# Patient Record
Sex: Female | Born: 1940
Health system: Southern US, Community
[De-identification: ages and names within clinical notes are randomized; demographics above are authoritative.]

## PROBLEM LIST (undated history)

## (undated) ENCOUNTER — Emergency Department (HOSPITAL_COMMUNITY): Admission: EM | Discharge status: Absent without leave | Payer: Medicare Other

## (undated) DIAGNOSIS — K449 Diaphragmatic hernia without obstruction or gangrene: Secondary | ICD-10-CM

## (undated) DIAGNOSIS — I499 Cardiac arrhythmia, unspecified: Secondary | ICD-10-CM

## (undated) DIAGNOSIS — R52 Pain, unspecified: Secondary | ICD-10-CM

## (undated) DIAGNOSIS — R053 Chronic cough: Secondary | ICD-10-CM

## (undated) DIAGNOSIS — F17210 Nicotine dependence, cigarettes, uncomplicated: Secondary | ICD-10-CM

## (undated) DIAGNOSIS — C159 Malignant neoplasm of esophagus, unspecified: Secondary | ICD-10-CM

## (undated) DIAGNOSIS — H269 Unspecified cataract: Secondary | ICD-10-CM

## (undated) DIAGNOSIS — Z8701 Personal history of pneumonia (recurrent): Secondary | ICD-10-CM

## (undated) DIAGNOSIS — C801 Malignant (primary) neoplasm, unspecified: Secondary | ICD-10-CM

## (undated) DIAGNOSIS — K635 Polyp of colon: Secondary | ICD-10-CM

## (undated) DIAGNOSIS — J189 Pneumonia, unspecified organism: Secondary | ICD-10-CM

## (undated) DIAGNOSIS — C3490 Malignant neoplasm of unspecified part of unspecified bronchus or lung: Secondary | ICD-10-CM

## (undated) DIAGNOSIS — K5790 Diverticulosis of intestine, part unspecified, without perforation or abscess without bleeding: Secondary | ICD-10-CM

## (undated) DIAGNOSIS — E559 Vitamin D deficiency, unspecified: Secondary | ICD-10-CM

## (undated) DIAGNOSIS — N809 Endometriosis, unspecified: Secondary | ICD-10-CM

## (undated) DIAGNOSIS — D492 Neoplasm of unspecified behavior of bone, soft tissue, and skin: Principal | ICD-10-CM

## (undated) DIAGNOSIS — K2270 Barrett's esophagus without dysplasia: Secondary | ICD-10-CM

## (undated) DIAGNOSIS — F329 Major depressive disorder, single episode, unspecified: Secondary | ICD-10-CM

## (undated) DIAGNOSIS — K219 Gastro-esophageal reflux disease without esophagitis: Secondary | ICD-10-CM

## (undated) DIAGNOSIS — E785 Hyperlipidemia, unspecified: Secondary | ICD-10-CM

## (undated) DIAGNOSIS — I10 Essential (primary) hypertension: Secondary | ICD-10-CM

## (undated) DIAGNOSIS — K2970 Gastritis, unspecified, without bleeding: Secondary | ICD-10-CM

## (undated) DIAGNOSIS — K589 Irritable bowel syndrome without diarrhea: Secondary | ICD-10-CM

## (undated) DIAGNOSIS — K529 Noninfective gastroenteritis and colitis, unspecified: Secondary | ICD-10-CM

## (undated) DIAGNOSIS — R05 Cough: Secondary | ICD-10-CM

## (undated) DIAGNOSIS — J439 Emphysema, unspecified: Secondary | ICD-10-CM

## (undated) DIAGNOSIS — R739 Hyperglycemia, unspecified: Secondary | ICD-10-CM

## (undated) HISTORY — PX: ABDOMINAL HYSTERECTOMY: SHX81

## (undated) HISTORY — DX: Endometriosis, unspecified: N80.9

## (undated) HISTORY — DX: Personal history of pneumonia (recurrent): Z87.01

## (undated) HISTORY — DX: Noninfective gastroenteritis and colitis, unspecified: K52.9

## (undated) HISTORY — DX: Nicotine dependence, cigarettes, uncomplicated: F17.210

## (undated) HISTORY — DX: Emphysema, unspecified: J43.9

## (undated) HISTORY — DX: Vitamin D deficiency, unspecified: E55.9

## (undated) HISTORY — DX: Polyp of colon: K63.5

## (undated) HISTORY — DX: Barrett's esophagus without dysplasia: K22.70

## (undated) HISTORY — DX: Malignant neoplasm of esophagus, unspecified: C15.9

## (undated) HISTORY — PX: GANGLION CYST EXCISION: SHX1691

## (undated) HISTORY — PX: TONSILLECTOMY: SUR1361

## (undated) HISTORY — PX: KNEE ARTHROSCOPY: SHX127

## (undated) HISTORY — DX: Gastro-esophageal reflux disease without esophagitis: K21.9

## (undated) HISTORY — PX: COLONOSCOPY W/ POLYPECTOMY: SHX1380

## (undated) HISTORY — DX: Diaphragmatic hernia without obstruction or gangrene: K44.9

## (undated) HISTORY — DX: Hyperlipidemia, unspecified: E78.5

## (undated) HISTORY — PX: TOTAL ABDOMINAL HYSTERECTOMY W/ BILATERAL SALPINGOOPHORECTOMY: SHX83

## (undated) HISTORY — DX: Diverticulosis of intestine, part unspecified, without perforation or abscess without bleeding: K57.90

## (undated) HISTORY — DX: Gastritis, unspecified, without bleeding: K29.70

## (undated) HISTORY — DX: Major depressive disorder, single episode, unspecified: F32.9

## (undated) HISTORY — DX: Unspecified cataract: H26.9

## (undated) HISTORY — DX: Malignant (primary) neoplasm, unspecified: C80.1

## (undated) HISTORY — PX: BARTHOLIN GLAND CYST EXCISION: SHX565

## (undated) HISTORY — DX: Neoplasm of unspecified behavior of bone, soft tissue, and skin: D49.2

## (undated) HISTORY — PX: CATARACT EXTRACTION: SUR2

---

## 1999-01-12 ENCOUNTER — Other Ambulatory Visit: Admission: RE | Admit: 1999-01-12 | Discharge: 1999-01-12 | Payer: Self-pay | Admitting: Family Medicine

## 2000-03-01 ENCOUNTER — Ambulatory Visit (HOSPITAL_COMMUNITY): Admission: RE | Admit: 2000-03-01 | Discharge: 2000-03-01 | Payer: Self-pay | Admitting: Gastroenterology

## 2000-03-01 ENCOUNTER — Encounter (INDEPENDENT_AMBULATORY_CARE_PROVIDER_SITE_OTHER): Payer: Self-pay | Admitting: Specialist

## 2000-07-14 ENCOUNTER — Ambulatory Visit (HOSPITAL_COMMUNITY): Admission: RE | Admit: 2000-07-14 | Discharge: 2000-07-14 | Payer: Self-pay | Admitting: Family Medicine

## 2000-07-14 ENCOUNTER — Encounter: Payer: Self-pay | Admitting: Family Medicine

## 2000-07-19 ENCOUNTER — Encounter: Payer: Self-pay | Admitting: Family Medicine

## 2000-07-19 ENCOUNTER — Encounter: Admission: RE | Admit: 2000-07-19 | Discharge: 2000-07-19 | Payer: Self-pay | Admitting: Family Medicine

## 2002-03-13 ENCOUNTER — Encounter: Payer: Self-pay | Admitting: Family Medicine

## 2002-03-13 ENCOUNTER — Encounter: Admission: RE | Admit: 2002-03-13 | Discharge: 2002-03-13 | Payer: Self-pay | Admitting: Family Medicine

## 2003-03-17 ENCOUNTER — Encounter: Admission: RE | Admit: 2003-03-17 | Discharge: 2003-03-17 | Payer: Self-pay | Admitting: Family Medicine

## 2003-03-17 ENCOUNTER — Encounter: Payer: Self-pay | Admitting: Family Medicine

## 2003-09-10 ENCOUNTER — Encounter: Admission: RE | Admit: 2003-09-10 | Discharge: 2003-09-10 | Payer: Self-pay | Admitting: Family Medicine

## 2003-09-10 ENCOUNTER — Encounter: Payer: Self-pay | Admitting: Family Medicine

## 2004-10-12 ENCOUNTER — Ambulatory Visit: Payer: Self-pay

## 2004-10-26 ENCOUNTER — Encounter: Admission: RE | Admit: 2004-10-26 | Discharge: 2004-10-26 | Payer: Self-pay | Admitting: Internal Medicine

## 2004-11-16 ENCOUNTER — Ambulatory Visit: Payer: Self-pay | Admitting: Cardiology

## 2006-09-11 ENCOUNTER — Encounter: Admission: RE | Admit: 2006-09-11 | Discharge: 2006-09-11 | Payer: Self-pay

## 2006-09-20 ENCOUNTER — Encounter: Admission: RE | Admit: 2006-09-20 | Discharge: 2006-09-20 | Payer: Self-pay

## 2006-10-06 ENCOUNTER — Encounter: Admission: RE | Admit: 2006-10-06 | Discharge: 2006-10-06 | Payer: Self-pay | Admitting: Family Medicine

## 2007-12-13 ENCOUNTER — Ambulatory Visit (HOSPITAL_COMMUNITY): Admission: RE | Admit: 2007-12-13 | Discharge: 2007-12-13 | Payer: Self-pay

## 2009-01-23 ENCOUNTER — Encounter: Admission: RE | Admit: 2009-01-23 | Discharge: 2009-01-23 | Payer: Self-pay | Admitting: Family Medicine

## 2009-01-29 ENCOUNTER — Encounter: Admission: RE | Admit: 2009-01-29 | Discharge: 2009-01-29 | Payer: Self-pay | Admitting: Family Medicine

## 2009-08-27 ENCOUNTER — Encounter: Admission: RE | Admit: 2009-08-27 | Discharge: 2009-08-27 | Payer: Self-pay | Admitting: Family Medicine

## 2014-10-03 ENCOUNTER — Other Ambulatory Visit: Payer: Self-pay | Admitting: Family Medicine

## 2014-10-03 ENCOUNTER — Ambulatory Visit
Admission: RE | Admit: 2014-10-03 | Discharge: 2014-10-03 | Disposition: A | Discharge status: Home / Self Care | Payer: Medicare Other | Source: Ambulatory Visit | Attending: Family Medicine | Admitting: Family Medicine

## 2014-10-03 DIAGNOSIS — R059 Cough, unspecified: Secondary | ICD-10-CM

## 2014-10-03 DIAGNOSIS — R05 Cough: Secondary | ICD-10-CM

## 2015-05-21 ENCOUNTER — Other Ambulatory Visit: Payer: Self-pay | Admitting: Family Medicine

## 2015-05-21 ENCOUNTER — Ambulatory Visit
Admission: RE | Admit: 2015-05-21 | Discharge: 2015-05-21 | Disposition: A | Discharge status: Home / Self Care | Payer: Medicare Other | Source: Ambulatory Visit | Attending: Family Medicine | Admitting: Family Medicine

## 2015-05-21 DIAGNOSIS — M25552 Pain in left hip: Secondary | ICD-10-CM

## 2015-06-11 ENCOUNTER — Encounter (HOSPITAL_COMMUNITY): Payer: Self-pay | Admitting: *Deleted

## 2015-06-11 ENCOUNTER — Emergency Department (HOSPITAL_COMMUNITY)
Admission: EM | Admit: 2015-06-11 | Discharge: 2015-06-12 | Disposition: A | Discharge status: Home / Self Care | Payer: Medicare Other | Attending: Emergency Medicine | Admitting: Emergency Medicine

## 2015-06-11 DIAGNOSIS — M25552 Pain in left hip: Secondary | ICD-10-CM | POA: Diagnosis present

## 2015-06-11 DIAGNOSIS — R5383 Other fatigue: Secondary | ICD-10-CM | POA: Insufficient documentation

## 2015-06-11 DIAGNOSIS — Z8719 Personal history of other diseases of the digestive system: Secondary | ICD-10-CM | POA: Insufficient documentation

## 2015-06-11 DIAGNOSIS — R1900 Intra-abdominal and pelvic swelling, mass and lump, unspecified site: Secondary | ICD-10-CM | POA: Insufficient documentation

## 2015-06-11 DIAGNOSIS — Z720 Tobacco use: Secondary | ICD-10-CM | POA: Diagnosis not present

## 2015-06-11 DIAGNOSIS — I10 Essential (primary) hypertension: Secondary | ICD-10-CM | POA: Insufficient documentation

## 2015-06-11 HISTORY — DX: Essential (primary) hypertension: I10

## 2015-06-11 HISTORY — DX: Irritable bowel syndrome, unspecified: K58.9

## 2015-06-11 LAB — BASIC METABOLIC PANEL
Anion gap: 7 (ref 5–15)
BUN: 19 mg/dL (ref 6–20)
CALCIUM: 9.6 mg/dL (ref 8.9–10.3)
CO2: 29 mmol/L (ref 22–32)
Chloride: 102 mmol/L (ref 101–111)
Creatinine, Ser: 0.87 mg/dL (ref 0.44–1.00)
GLUCOSE: 103 mg/dL — AB (ref 65–99)
Potassium: 3.4 mmol/L — ABNORMAL LOW (ref 3.5–5.1)
SODIUM: 138 mmol/L (ref 135–145)

## 2015-06-11 LAB — CBC WITH DIFFERENTIAL/PLATELET
BASOS ABS: 0.1 10*3/uL (ref 0.0–0.1)
Basophils Relative: 1 % (ref 0–1)
EOS ABS: 0.2 10*3/uL (ref 0.0–0.7)
EOS PCT: 2 % (ref 0–5)
HEMATOCRIT: 40.5 % (ref 36.0–46.0)
Hemoglobin: 13.4 g/dL (ref 12.0–15.0)
Lymphocytes Relative: 21 % (ref 12–46)
Lymphs Abs: 1.8 10*3/uL (ref 0.7–4.0)
MCH: 28.8 pg (ref 26.0–34.0)
MCHC: 33.1 g/dL (ref 30.0–36.0)
MCV: 87.1 fL (ref 78.0–100.0)
Monocytes Absolute: 0.6 10*3/uL (ref 0.1–1.0)
Monocytes Relative: 6 % (ref 3–12)
Neutro Abs: 6.2 10*3/uL (ref 1.7–7.7)
Neutrophils Relative %: 70 % (ref 43–77)
PLATELETS: 204 10*3/uL (ref 150–400)
RBC: 4.65 MIL/uL (ref 3.87–5.11)
RDW: 13.2 % (ref 11.5–15.5)
WBC: 8.9 10*3/uL (ref 4.0–10.5)

## 2015-06-11 MED ORDER — OXYCODONE-ACETAMINOPHEN 5-325 MG PO TABS
ORAL_TABLET | ORAL | Status: AC
  Filled 2015-06-11: qty 1

## 2015-06-11 MED ORDER — OXYCODONE-ACETAMINOPHEN 5-325 MG PO TABS
1.0000 | ORAL_TABLET | Freq: Once | ORAL | Status: AC
  Administered 2015-06-11: 1 via ORAL

## 2015-06-11 MED ORDER — IOHEXOL 300 MG/ML  SOLN
25.0000 mL | Freq: Once | INTRAMUSCULAR | Status: AC | PRN
  Administered 2015-06-11: 25 mL via ORAL

## 2015-06-11 NOTE — ED Notes (Signed)
Will inform CT when pt's blood work has resulted.

## 2015-06-11 NOTE — ED Notes (Signed)
Pt in c/o left hip pain that has been going on for a few weeks and has been treated by PCP, sent over today due to worsening symptoms and increased pain for CT scans of hip and pelvis, denies n/v or fever, no distress noted

## 2015-06-11 NOTE — ED Provider Notes (Signed)
CSN: [id number]     Arrival date & time 06/11/15  1722 History   First MD Initiated Contact with Patient 06/11/15 2158     Chief Complaint  Patient presents with  . Hip Pain     (Consider location/radiation/quality/duration/timing/severity/associated sxs/prior Treatment) Patient is a 74 y.o. female presenting with hip pain. The history is provided by the patient. No language interpreter was used.  Hip Pain This is a chronic problem. The current episode started more than 1 month ago. The problem occurs constantly. The problem has been gradually worsening. Associated symptoms include arthralgias (left hip pain) and fatigue. Pertinent negatives include no abdominal pain, anorexia, change in bowel habit, chest pain, chills, congestion, coughing, diaphoresis, fever, headaches, myalgias, nausea, vomiting or weakness. Treatments tried: steroid injections, OTC pain meds. The treatment provided no relief.    Past Medical History  Diagnosis Date  . IBS (irritable bowel syndrome)   . Hypertension    History reviewed. No pertinent past surgical history. History reviewed. No pertinent family history. History  Substance Use Topics  . Smoking status: Current Every Day Smoker  . Smokeless tobacco: Not on file  . Alcohol Use: Not on file   OB History    No data available     Review of Systems  Constitutional: Positive for fatigue. Negative for fever, chills and diaphoresis.  HENT: Negative for congestion.   Respiratory: Negative for cough, chest tightness and shortness of breath.   Cardiovascular: Negative for chest pain.  Gastrointestinal: Negative for nausea, vomiting, abdominal pain, anorexia and change in bowel habit.  Musculoskeletal: Positive for arthralgias (left hip pain). Negative for myalgias.  Neurological: Negative for weakness, light-headedness and headaches.  Psychiatric/Behavioral: Negative for confusion.  All other systems reviewed and are negative.     Allergies  Review  of patient's allergies indicates no known allergies.  Home Medications   Prior to Admission medications   Not on File   BP 106/70 mmHg  Pulse 49  Temp(Src) 97.9 F (36.6 C) (Oral)  Resp 18  SpO2 93% Physical Exam  Constitutional: She is oriented to person, place, and time. She appears well-developed and well-nourished. No distress.  HENT:  Head: Normocephalic and atraumatic.  Nose: Nose normal.  Mouth/Throat: Oropharynx is clear and moist. No oropharyngeal exudate.  Eyes: EOM are normal. Pupils are equal, round, and reactive to light.  Neck: Normal range of motion. Neck supple.  Cardiovascular: Normal rate, regular rhythm, normal heart sounds and intact distal pulses.   No murmur heard. Pulmonary/Chest: Effort normal and breath sounds normal. No respiratory distress. She has no wheezes. She exhibits no tenderness.  Abdominal: Soft. There is no tenderness. There is no rebound and no guarding.  nontender throughout.  No masses palpable.  No inguinal lymphadenopathy palpable  Musculoskeletal: Normal range of motion. She exhibits no tenderness.  No reproducible tenderness at left hip posteriorly, laterally, or left anterior hip/groin.  Full ROM of bilateral lower extremities.  Good distal pulses.  No edema.    Lymphadenopathy:    She has no cervical adenopathy.  Neurological: She is alert and oriented to person, place, and time. No cranial nerve deficit. Coordination normal.  Skin: Skin is warm and dry. She is not diaphoretic.  Psychiatric: She has a normal mood and affect. Her behavior is normal. Judgment and thought content normal.  Nursing note and vitals reviewed.   ED Course  Procedures (including critical care time) Labs Review Labs Reviewed  CBC WITH DIFFERENTIAL/PLATELET  BASIC METABOLIC PANEL  Imaging Review Ct Abdomen Pelvis W Contrast  06/12/2015   CLINICAL DATA:  Severe left groin and hip pain for 6 months.  EXAM: CT ABDOMEN AND PELVIS WITH CONTRAST  TECHNIQUE:  Multidetector CT imaging of the abdomen and pelvis was performed using the standard protocol following bolus administration of intravenous contrast.  CONTRAST:  21m OMNIPAQUE IOHEXOL 300 MG/ML  SOLN  COMPARISON:  None.  FINDINGS: BODY WALL: No contributory findings.  LOWER CHEST: No contributory findings.  ABDOMEN/PELVIS:  Liver: Multiple low-dense hepatic lesions are most consistent with cysts. No evidence of solid mass.  Biliary: No evidence of biliary obstruction or stone.  Pancreas: Unremarkable.  Spleen: Unremarkable.  Adrenals: Unremarkable.  Kidneys and ureters: No hydronephrosis or stone.  Bladder: Unremarkable.  Reproductive: Hysterectomy and probable oophorectomies. No adnexal mass.  Bowel: No obstruction. No appendicitis.  Retroperitoneum: No mass or adenopathy.  Peritoneum: No ascites or pneumoperitoneum.  Vascular: No acute abnormality. Multi focal atherosclerosis of the aorta and branch vessels.  OSSEOUS: Destructive lytic lesion encompassing the majority of the left iliac wing, measuring at least 9 cm in diameter and 5.5 cm in thickness. The lesion is centrally cystic and peripherally enhancing. There is invasion into the iliacus and gluteal musculature. No other bone lesion. No discrete fracture.  IMPRESSION: 9 cm aggressive lesion in the left ilium, infiltrating the iliacus and gluteal muscles. This could reflect a plasmacytoma, metastasis, or sarcoma.   Electronically Signed   By: JMonte FantasiaM.D.   On: 06/12/2015 00:30     EKG Interpretation None      MDM   Final diagnoses:  Pelvic mass  Left hip pain   Pt is a 74yo F with hx of IBS, tobacco abuse, and HTN who presented with left groin pain.  Has had achy left hip pain for the past few months. Pain has been vague dull pain that is diffuse at left posterior and lateral left hip. Today while walking she developed left groin pain. More severe than previous pain in her hip.  Denies falls, stepping funny, or hearing popping sounds.    Has been seen by PCP for her pain.  Received 2 rounds of steroid injections without improvement.  Went back today to PCP and he was concerned about possibly palpating a left groin mass, so referred her to the ED.  Pt is s/p hystererctomy and oophorectomy but has a significant family hx of colon cancer (dad and brother).  Due to the mass, pt was sent to the ED to get a CT abdomen.   No pinpoint tenderness to palpation of left lower abdomen/groin or hip.  No masses palpated at this time.  No lymphadenopathy.  Full ROM of left hip and nontender at lateral hip palpation.    Will get labs then send to CT scanner for CT abd/pelvis with contrast to evaluate further due to concerns for possible mass and chronic dull pain for months in this patient with risk factors for cancers.   CT scan returned with a large left mass at her iliac wing, concerning for sarcoma or possible met.  No other known cancers.  + significant tobacco abuse.  + significant family hx of colon cancer; her last colonoscopy was ok in October.   Patient was informed and all questions were answered.  Will need to follow up closely with heme/onc for determining the next step.  Has f/u already scheduled with PCP on Monday (3 days) so they can help ensure appropriate heme/onc follow up is made  if patient has difficulty getting an appointment.  She was given a print out of the radiology read and advised how to call medical records to get a CD image if her other doctors need it in the future.  Given Rx for oxycodone and advised on use and risks of narcotics.  Discussed ED return precautions.      If performed, labs, EKGs, and imaging were reviewed and interpreted by myself and my attending, and incorporated in the medical decision making.  Patient was seen with ED Attending, Dr. Oliva Bustard, MD   Tori Milks, MD 06/13/15 [id number]  Pamella Pert, MD 06/13/15 [phone]

## 2015-06-11 NOTE — ED Notes (Signed)
CT called and informed that pt is ready.

## 2015-06-12 ENCOUNTER — Encounter (HOSPITAL_COMMUNITY): Payer: Self-pay

## 2015-06-12 ENCOUNTER — Emergency Department (HOSPITAL_COMMUNITY): Payer: Medicare Other

## 2015-06-12 MED ORDER — OXYCODONE HCL 5 MG PO TABS: 5.0000 mg | ORAL_TABLET | Freq: Four times a day (QID) | ORAL | Status: DC | PRN

## 2015-06-12 MED ORDER — OXYCODONE HCL 5 MG PO TABS
5.0000 mg | ORAL_TABLET | Freq: Once | ORAL | Status: AC
  Administered 2015-06-12: 5 mg via ORAL
  Filled 2015-06-12: qty 1

## 2015-06-12 MED ORDER — IOHEXOL 300 MG/ML  SOLN
80.0000 mL | Freq: Once | INTRAMUSCULAR | Status: AC | PRN
  Administered 2015-06-12: 80 mL via INTRAVENOUS

## 2015-06-12 NOTE — ED Notes (Signed)
MD at bedside. 

## 2015-06-12 NOTE — Discharge Instructions (Signed)
Please follow up with the hematology/oncology clinic as soon as possible to help identify your pelvic bone mass.     CLINICAL DATA: Severe left groin and hip pain for 6 months.  EXAM: CT ABDOMEN AND PELVIS WITH CONTRAST  TECHNIQUE: Multidetector CT imaging of the abdomen and pelvis was performed using the standard protocol following bolus administration of intravenous contrast.  CONTRAST: 65m OMNIPAQUE IOHEXOL 300 MG/ML SOLN  COMPARISON: None.  FINDINGS: BODY WALL: No contributory findings.  LOWER CHEST: No contributory findings.  ABDOMEN/PELVIS:  Liver: Multiple low-dense hepatic lesions are most consistent with cysts. No evidence of solid mass.  Biliary: No evidence of biliary obstruction or stone.  Pancreas: Unremarkable.  Spleen: Unremarkable.  Adrenals: Unremarkable.  Kidneys and ureters: No hydronephrosis or stone.  Bladder: Unremarkable.  Reproductive: Hysterectomy and probable oophorectomies. No adnexal mass.  Bowel: No obstruction. No appendicitis.  Retroperitoneum: No mass or adenopathy.  Peritoneum: No ascites or pneumoperitoneum.  Vascular: No acute abnormality. Multi focal atherosclerosis of the aorta and branch vessels.  OSSEOUS: Destructive lytic lesion encompassing the majority of the left iliac wing, measuring at least 9 cm in diameter and 5.5 cm in thickness. The lesion is centrally cystic and peripherally enhancing. There is invasion into the iliacus and gluteal musculature. No other bone lesion. No discrete fracture.  IMPRESSION: 9 cm aggressive lesion in the left ilium, infiltrating the iliacus and gluteal muscles. This could reflect a plasmacytoma, metastasis, or sarcoma.

## 2015-06-16 ENCOUNTER — Encounter: Payer: Self-pay | Admitting: Hematology

## 2015-06-16 ENCOUNTER — Ambulatory Visit: Payer: Medicare Other

## 2015-06-16 ENCOUNTER — Encounter: Payer: Medicare Other | Admitting: Hematology

## 2015-06-16 ENCOUNTER — Telehealth: Payer: Self-pay | Admitting: Hematology

## 2015-06-16 NOTE — Telephone Encounter (Signed)
new patient appt-s/w patient and gave np appt for 07/12 @ 12:30 w/Dr.Kale.  Referring Dr. Tamsen Roers Dx- Sarcoma; Plasmacytoma

## 2015-06-16 NOTE — Progress Notes (Signed)
Pt request to reschedule in order to be seen by Dr Julien Nordmann.   This encounter was created in error - please disregard.

## 2015-06-18 ENCOUNTER — Telehealth: Payer: Self-pay | Admitting: Internal Medicine

## 2015-06-18 NOTE — Telephone Encounter (Signed)
New patient appt-s/w patient and gave np appt for 07/25 @ 11:15 w/Dr. Julien Nordmann.  Dx- Sarcoma; plasmacytoma

## 2015-06-24 ENCOUNTER — Encounter: Payer: Self-pay | Admitting: Hematology

## 2015-06-24 ENCOUNTER — Other Ambulatory Visit: Payer: Self-pay | Admitting: *Deleted

## 2015-06-24 ENCOUNTER — Telehealth: Payer: Self-pay | Admitting: Hematology

## 2015-06-24 ENCOUNTER — Ambulatory Visit (HOSPITAL_BASED_OUTPATIENT_CLINIC_OR_DEPARTMENT_OTHER): Payer: Medicare Other | Admitting: Hematology

## 2015-06-24 ENCOUNTER — Other Ambulatory Visit (HOSPITAL_BASED_OUTPATIENT_CLINIC_OR_DEPARTMENT_OTHER): Payer: Medicare Other

## 2015-06-24 VITALS — BP 85/55 | HR 64 | Temp 98.2°F | Resp 18 | Ht 68.0 in | Wt 135.7 lb

## 2015-06-24 DIAGNOSIS — E43 Unspecified severe protein-calorie malnutrition: Secondary | ICD-10-CM | POA: Diagnosis present

## 2015-06-24 DIAGNOSIS — F329 Major depressive disorder, single episode, unspecified: Secondary | ICD-10-CM

## 2015-06-24 DIAGNOSIS — D492 Neoplasm of unspecified behavior of bone, soft tissue, and skin: Secondary | ICD-10-CM | POA: Diagnosis not present

## 2015-06-24 DIAGNOSIS — F32A Depression, unspecified: Secondary | ICD-10-CM | POA: Insufficient documentation

## 2015-06-24 DIAGNOSIS — G893 Neoplasm related pain (acute) (chronic): Secondary | ICD-10-CM | POA: Insufficient documentation

## 2015-06-24 DIAGNOSIS — Z720 Tobacco use: Secondary | ICD-10-CM

## 2015-06-24 DIAGNOSIS — R630 Anorexia: Secondary | ICD-10-CM | POA: Diagnosis not present

## 2015-06-24 DIAGNOSIS — E46 Unspecified protein-calorie malnutrition: Secondary | ICD-10-CM | POA: Insufficient documentation

## 2015-06-24 DIAGNOSIS — C7951 Secondary malignant neoplasm of bone: Secondary | ICD-10-CM | POA: Insufficient documentation

## 2015-06-24 DIAGNOSIS — F17210 Nicotine dependence, cigarettes, uncomplicated: Secondary | ICD-10-CM | POA: Insufficient documentation

## 2015-06-24 DIAGNOSIS — I10 Essential (primary) hypertension: Secondary | ICD-10-CM

## 2015-06-24 HISTORY — DX: Neoplasm of unspecified behavior of bone, soft tissue, and skin: D49.2

## 2015-06-24 HISTORY — DX: Essential (primary) hypertension: I10

## 2015-06-24 HISTORY — DX: Depression, unspecified: F32.A

## 2015-06-24 LAB — COMPREHENSIVE METABOLIC PANEL (CC13)
ALK PHOS: 102 U/L (ref 40–150)
ALT: 7 U/L (ref 0–55)
AST: 13 U/L (ref 5–34)
Albumin: 2.9 g/dL — ABNORMAL LOW (ref 3.5–5.0)
Anion Gap: 9 mEq/L (ref 3–11)
BILIRUBIN TOTAL: 0.45 mg/dL (ref 0.20–1.20)
BUN: 20.4 mg/dL (ref 7.0–26.0)
CALCIUM: 10.2 mg/dL (ref 8.4–10.4)
CHLORIDE: 96 meq/L — AB (ref 98–109)
CO2: 31 mEq/L — ABNORMAL HIGH (ref 22–29)
Creatinine: 1 mg/dL (ref 0.6–1.1)
EGFR: 57 mL/min/{1.73_m2} — AB (ref >90)
Glucose: 122 mg/dl (ref 70–140)
Potassium: 3.4 mEq/L — ABNORMAL LOW (ref 3.5–5.1)
Sodium: 136 mEq/L (ref 136–145)
Total Protein: 7.2 g/dL (ref 6.4–8.3)

## 2015-06-24 LAB — CBC & DIFF AND RETIC
BASO%: 0.6 % (ref 0.0–2.0)
Basophils Absolute: 0.1 10*3/uL (ref 0.0–0.1)
EOS ABS: 0.2 10*3/uL (ref 0.0–0.5)
EOS%: 1.6 % (ref 0.0–7.0)
HEMATOCRIT: 39.1 % (ref 34.8–46.6)
HGB: 13.2 g/dL (ref 11.6–15.9)
IMMATURE RETIC FRACT: 4.4 % (ref 1.60–10.00)
LYMPH#: 1.3 10*3/uL (ref 0.9–3.3)
LYMPH%: 13.1 % — AB (ref 14.0–49.7)
MCH: 29.5 pg (ref 25.1–34.0)
MCHC: 33.8 g/dL (ref 31.5–36.0)
MCV: 87.3 fL (ref 79.5–101.0)
MONO#: 0.9 10*3/uL (ref 0.1–0.9)
MONO%: 8.6 % (ref 0.0–14.0)
NEUT#: 7.6 10*3/uL — ABNORMAL HIGH (ref 1.5–6.5)
NEUT%: 76.1 % (ref 38.4–76.8)
PLATELETS: 208 10*3/uL (ref 145–400)
RBC: 4.48 10*6/uL (ref 3.70–5.45)
RDW: 13.1 % (ref 11.2–14.5)
RETIC %: 1.17 % (ref 0.70–2.10)
Retic Ct Abs: 52.42 10*3/uL (ref 33.70–90.70)
WBC: 10 10*3/uL (ref 3.9–10.3)

## 2015-06-24 LAB — MAGNESIUM (CC13): Magnesium: 1.7 mg/dl (ref 1.5–2.5)

## 2015-06-24 LAB — LACTATE DEHYDROGENASE (CC13): LDH: 191 U/L (ref 125–245)

## 2015-06-24 LAB — URIC ACID (CC13): Uric Acid, Serum: 8.1 mg/dl — ABNORMAL HIGH (ref 2.6–7.4)

## 2015-06-24 MED ORDER — OXYCODONE HCL 5 MG PO TABS: 5.0000 mg | ORAL_TABLET | ORAL | Status: DC | PRN

## 2015-06-24 MED ORDER — CYANOCOBALAMIN 2000 MCG PO TABS: 2000.0000 ug | ORAL_TABLET | Freq: Every day | ORAL | Status: DC

## 2015-06-24 MED ORDER — SENNOSIDES-DOCUSATE SODIUM 8.6-50 MG PO TABS: 2.0000 | ORAL_TABLET | Freq: Every evening | ORAL | Status: DC | PRN

## 2015-06-24 MED ORDER — OXYCODONE HCL ER 10 MG PO T12A: 10.0000 mg | EXTENDED_RELEASE_TABLET | Freq: Two times a day (BID) | ORAL | Status: DC

## 2015-06-24 MED ORDER — ONDANSETRON HCL 4 MG PO TABS: 4.0000 mg | ORAL_TABLET | Freq: Three times a day (TID) | ORAL | Status: DC | PRN

## 2015-06-24 MED ORDER — MIRTAZAPINE 15 MG PO TABS: 15.0000 mg | ORAL_TABLET | Freq: Every day | ORAL | Status: DC

## 2015-06-24 NOTE — Assessment & Plan Note (Signed)
Patient has a history of essential hypertension and had been on hydrochlorothiazide and amlodipine. She notes that she has not been taking her hydrochlorothiazide for a few weeks. Her blood pressure today was in the lower normal range. No orthostatic symptoms or symptoms of hypotension. Appears to be mildly dehydrated. Also her weight loss of 40 pounds has likely resulted in decreased need for antihypertensives. Her pain medications could also cause somewhat lower blood pressures. Plan -She was recommended discontinuation of her hydrochlorothiazide and amlodipine at this time. -Increase oral hydration and intake of potassium-rich liquids like orange juice, tomato juice Coconut water due to mild hypokalemia of 3.4. -Called with symptoms of dizziness or lightheadedness if present.

## 2015-06-24 NOTE — Assessment & Plan Note (Signed)
Anorexia likely due to malignancy Plan -Management of underlying malignancy -Started Remeron 15 mg by mouth daily at bedtime. -Might consider adding Marinol and low-dose dexamethasone for appetite as needed -Dietary counseling done and referable given to nutrition therapy.

## 2015-06-24 NOTE — Assessment & Plan Note (Signed)
>>  ASSESSMENT AND PLAN FOR PROTEIN CALORIE MALNUTRITION ([facility]) WRITTEN ON 06/24/2015  4:25 PM BY Johney Maine, MD  Patient has lost 40 pounds since the beginning of this year due to anorexia and likely cancer related cachexia. We talked about multiple different treatment options for anorexia and noted that pain control was important. She does not have significant nausea at this time. Plan -We'll start the patient on Remeron 15 mg by mouth at bedtime which might address her insomnia, depression as well as so as an appetite stimulant. -We'll refer the patient to nutrition therapy -Patient has been using some ensure at home and was counseled to increase her caloric intake. -When necessary Zofran was provided for nausea management.

## 2015-06-24 NOTE — Telephone Encounter (Signed)
per pof to sch pt appt-gave pt copy of avs °

## 2015-06-24 NOTE — Assessment & Plan Note (Signed)
Depression and anxiety related to her potential medical diagnosis, weight loss and anorexia. Plan -We shall address her symptom control aggressively -Supportive counseling provided -Social worker referable given for additional supportive counseling and counseling regarding available wellness inputs. -Started the patient on Remeron 15 mg by mouth at bedtime -No suicidal or homicidal ideation -Daughter and significant other quite supportive and at clinic visit with her.

## 2015-06-24 NOTE — Assessment & Plan Note (Signed)
Patient still smoking more than 2 packs of cigarettes a day. She notes that she is not emotionally in a good place to try to quit smoking at this time and does not want any specific inquiries or recommendations for this.  Plan -Offered the patient help with smoking cessation -Informed her that there are several things we can try if she is open to considering smoking cessation. -She wants to hold off on smoking cessation at this time given her anxieties about the diagnosis and management plan. -Emotional support provided.

## 2015-06-24 NOTE — Patient Instructions (Signed)
-  Discontinue your anti HTN medications i.e HCTZ and Amlodipine -we will f/u labs today -CT guided biopsy of the left ilium mass ASAP -PET/CT for staging -Return to clinic with Dr Irene Limbo in 3-4 days after biopsy done, preferably with PET/CT

## 2015-06-24 NOTE — Progress Notes (Signed)
[name]    CONSULT NOTE  Patient Care Team: Tamsen Roers, MD as PCP - General (Family Medicine) [name], MD as Consulting Physician (Hematology and Oncology)  CHIEF COMPLAINTS/PURPOSE OF CONSULTATION:  Left hip and groin pain with CT scan showing aggressive left ilium neoplastic lesion.  HISTORY OF PRESENTING ILLNESS:  Cindy Byrd is a pleasant 74 year old Caucasian female with below mentioned past medical history including hypertension, irritable bowel syndrome, heavy cigarette smoking more than 100 pack years, family history of colon cancer in her father and 2 brothers and breast cancer in her daughter has been referred to [place] in consultation by her primary care physician Dr. Tamsen Roers for evaluation and management of her newly noted left ilium mass.  Patient was apparently in her usual state of health until the first of this year when she noted some left hip/groin pain. At first it was mild and was not bothersome she was able to motor along. Over the last month or so the pain started getting progressively worse. She notes that she had a couple of left hip steroid shots by her primary care physician without any alleviation of her pain. She notes that she subsequently had a CT of the abdomen and pelvis done on 06/12/2015 which revealed an aggressive looking left ilium 9 cm mass with infiltration of the iliacus and gluteal muscles causing significant pain and some weakness in her left lower extremity. She feels that the left lower extremity is unstable and does not feel comfortable bearing weight on it and taking the right foot of the ground.  She notes that she was started on oxycodone IR 5 mg every 6 hours as needed for pain which is only somewhat controlling her pain. She notes significant anorexia that has been progressive and has resulted in about a 40 pound weight loss since earlier this year. She notes no overt nausea. Does have chronic diarrhea which is related to her IBS but is  better since being on narcotics.  She notes that she has not been taking her hydrochlorothiazide or blood pressure. Feels like she is eating poorly. Her blood pressure was in the high 43X systolic today without any dizziness or orthostasis with normal mentation. This is likely due to her lack of needing antihypertensives due to her significant weight loss.  She also notes feeling depressed and not sleeping well as a function of anxiety as well as untreated pain. She is open to medications to help with optimizing her pain control and improving her appetite.  She is anxious to reach a diagnosis as soon as possible and start treating this. She mentions he has a wonderful family who has been very supportive and she intends to fight to beat her illness so she can spend more time with her family.    MEDICAL HISTORY:  Past Medical History  Diagnosis Date  . IBS (irritable bowel syndrome)   . Bone neoplasm 06/24/2015  . Hypertension 06/24/2015    likely improved incidental to 40 lbs weight loss from her neoplasm  . GERD (gastroesophageal reflux disease)   . Vitamin D deficiency disease   . Cigarette smoker two packs a day or less     Currently still smoking 2 PPD - Not interested in quitting at this time.  . Colon polyps     hyperplastic, tubular adenomas, tubulovillous adenoma  . Endometriosis     Hysterectomy with BSO at age 66 yrs  . H/O: pneumonia   . Heavy smoker (more than 20 cigarettes  per day) 06/24/2015  . Depression 06/24/2015   SURGICAL HISTORY: Past Surgical History  Procedure Laterality Date  . Abdominal hysterectomy    . Tonsillectomy    . Ganglion cyst excision    . Total abdominal hysterectomy w/ bilateral salpingoophorectomy  at age 63 yrs    For endometriosis  . Knee arthroscopy  age about 29 yrs  . Bartholin gland cyst excision  74 yo ago    Does not want if it was an infected cyst or tumor. Was soon as delivery  . Colonoscopy w/ polypectomy      multiple times - last  done 09/2014 per patient.    SOCIAL HISTORY: History   Social History  . Marital Status: Widowed    Spouse Name: N/A  . Number of Children: N/A  . Years of Education: N/A   Occupational History  . Not on file.   Social History Main Topics  . Smoking status: Current Every Day Smoker -- 2.00 packs/day for 60 years    Types: Cigarettes  . Smokeless tobacco: Not on file  . Alcohol Use: No  . Drug Use: No  . Sexual Activity: No   Other Topics Concern  . Not on file   Social History Narrative    FAMILY HISTORY: Family History  Problem Relation Age of Onset  . Stroke Mother   . Colon cancer Father   . Colon cancer Brother   . Colon cancer Brother   . Breast cancer Daughter 43    ER/PR+ stage II    ALLERGIES:  has No Known Allergies. patient wonders if she has a penicillin allergy but notes that she is uncertain about this.  MEDICATIONS:  Current Outpatient Prescriptions  Medication Sig Dispense Refill  . Biotin 5 MG CAPS Take 5 mg by mouth daily.    . bisoprolol (ZEBETA) 5 MG tablet Take 5 mg by mouth daily.    . cholecalciferol (VITAMIN D) 1000 UNITS tablet Take 1,000 Units by mouth daily.    . cyanocobalamin ([employer] VITAMIN B12) 2000 MCG tablet Take 1 tablet (2,000 mcg total) by mouth daily. 1 tablet 0  . mirtazapine (REMERON) 15 MG tablet Take 1 tablet (15 mg total) by mouth at bedtime. 30 tablet 1  . omeprazole (PRILOSEC OTC) 20 MG tablet Take 20 mg by mouth daily.    . ondansetron (ZOFRAN) 4 MG tablet Take 1 tablet (4 mg total) by mouth every 8 (eight) hours as needed for nausea or vomiting. 30 tablet 0  . oxyCODONE (OXY IR/ROXICODONE) 5 MG immediate release tablet Take 1 tablet (5 mg total) by mouth every 4 (four) hours as needed for severe pain. 60 tablet 0  . OxyCODONE (OXYCONTIN) 10 mg T12A 12 hr tablet Take 1 tablet (10 mg total) by mouth every 12 (twelve) hours. 60 tablet 0  . senna-docusate (SENOKOT-S) 8.6-50 MG per tablet Take 2 tablets by mouth at bedtime as  needed for mild constipation. 30 tablet 1   No current facility-administered medications for this visit.    REVIEW OF SYSTEMS:   Constitutional: Denies fevers, chills or abnormal night sweats Eyes: Denies blurriness of vision, double vision or watery eyes Ears, nose, mouth, throat, and face: Denies mucositis or sore throat Respiratory: Denies cough, dyspnea or wheezes Cardiovascular: Denies palpitation, chest discomfort or lower extremity swelling Gastrointestinal:  Denies nausea, heartburn or change in bowel habits Skin: Denies abnormal skin rashes Lymphatics: Denies new lymphadenopathy or easy bruising Neurological:Denies numbness, tingling or new weaknesses Behavioral/Psych: Mildly depressed affect ,  Mood is stable no suicidal or homicidal ideation   All other systems were reviewed with the patient and are negative.   PHYSICAL EXAMINATION: ECOG PERFORMANCE STATUS: 2 - Symptomatic, <50% confined to bed  Filed Vitals:   06/24/15 1402  BP: 85/55  Pulse: 64  Temp: 98.2 F (36.8 C)  Resp: 18   Filed Weights   06/24/15 1402  Weight: 135 lb 11.2 oz (61.553 kg)    GENERAL:alert, mild distress from pain and anxiety. SKIN: skin color, texture, turgor are normal, no rashes or significant lesions EYES: normal, conjunctiva are pink and non-injected, sclera clear OROPHARYNX:no exudate, no erythema and lips, buccal mucosa, and tongue normal  NECK: supple, thyroid normal size, non-tender, without nodularity LYMPH:  no palpable lymphadenopathy in the cervical, axillary or inguinal LUNGS: clear to auscultation and bilateral reduced breath sounds with normal respiratory effort  HEART: regular rate & rhythm and no murmurs and no lower extremity edema ABDOMEN:abdomen soft, non-tender and normal bowel sounds MusculoskeletalNo pedal edema. No calf pain or tenderness. Fullness over the left lateral hip and gluteal area with no overt skin changes. PSYCH: alert & oriented x 3 with fluent  speech NEURO: no focal motor/sensory deficits  LABORATORY DATA:  I have reviewed the data as listed Lab Results  Component Value Date   WBC 10.0 06/24/2015   HGB 13.2 06/24/2015   HCT 39.1 06/24/2015   MCV 87.3 06/24/2015   PLT 208 06/24/2015    Recent Labs  06/11/15 2252 06/24/15 1325  NA 138 136  K 3.4* 3.4*  CL 102  --   CO2 29 31*  GLUCOSE 103* 122  BUN 19 20.4  CREATININE 0.87 1.0  CALCIUM 9.6 10.2  GFRNONAA >60  --   GFRAA >60  --   PROT  --  7.2  ALBUMIN  --  2.9*  AST  --  13  ALT  --  7  ALKPHOS  --  102  BILITOT  --  0.45    RADIOGRAPHIC STUDIES: I have personally reviewed the radiological images as listed and agreed with the findings in the report. Ct Abdomen Pelvis W Contrast  06/12/2015   CLINICAL DATA:  Severe left groin and hip pain for 6 months.  EXAM: CT ABDOMEN AND PELVIS WITH CONTRAST  TECHNIQUE: Multidetector CT imaging of the abdomen and pelvis was performed using the standard protocol following bolus administration of intravenous contrast.  CONTRAST:  1m OMNIPAQUE IOHEXOL 300 MG/ML  SOLN  COMPARISON:  None.  FINDINGS: BODY WALL: No contributory findings.  LOWER CHEST: No contributory findings.  ABDOMEN/PELVIS:  Liver: Multiple low-dense hepatic lesions are most consistent with cysts. No evidence of solid mass.  Biliary: No evidence of biliary obstruction or stone.  Pancreas: Unremarkable.  Spleen: Unremarkable.  Adrenals: Unremarkable.  Kidneys and ureters: No hydronephrosis or stone.  Bladder: Unremarkable.  Reproductive: Hysterectomy and probable oophorectomies. No adnexal mass.  Bowel: No obstruction. No appendicitis.  Retroperitoneum: No mass or adenopathy.  Peritoneum: No ascites or pneumoperitoneum.  Vascular: No acute abnormality. Multi focal atherosclerosis of the aorta and branch vessels.  OSSEOUS: Destructive lytic lesion encompassing the majority of the left iliac wing, measuring at least 9 cm in diameter and 5.5 cm in thickness. The lesion is  centrally cystic and peripherally enhancing. There is invasion into the iliacus and gluteal musculature. No other bone lesion. No discrete fracture.  IMPRESSION: 9 cm aggressive lesion in the left ilium, infiltrating the iliacus and gluteal muscles. This could reflect a plasmacytoma, metastasis,  or sarcoma.   Electronically Signed   By: Monte Fantasia M.D.   On: 06/12/2015 00:30    ASSESSMENT & PLAN:  .Bone neoplasm Patient presents with left hip/groin pain since the beginning of the year and getting much worse in the last month. Had a CT of the abdomen and pelvis which showed a 9 cm aggressive appearing left ilium lesion with infiltration of the iliacus and gluteal muscles. This is concerning for a malignancy given the radiographic imaging findings, her 40 pound weight loss. The differential diagnosis would involve a primary bone tumor/sarcoma, lymphoma, plasmacytoma or less likely a metastatic lesion from elsewhere. Plan -We'll get baseline labs today including CBC, CMP, LDH -Myeloma labs with SPEP with immunofixation and quantitative immunoglobulins, serum kappa lambda free light chains, beta-2 microglobulin. -PT/INR/PTT needed for CT-guided biopsy. -Urgent CT-guided biopsy of the left  Ilium mass for diagnosis. Specimen would need to be sent for surgical pathology and lymphoma workup. Additional sample might be needed for flow cytometry, cytogenetics, FISH studies another molecular studies as indicated. -PET/CT scan for staging purposes -Return to his care with Dr. Irene Limbo 3-4 days after biopsy hopefully with the PET scan results   Neoplasm related pain Patient had excruciating pain in her left hip/buttocks due to an aggressive left ilium tumor. She notes that her current oxycodone IR 5 mg every 6 hours is not controlling her pain and that it wakes her up multiple times at night. She is requesting additional pain management which is appropriate. Plan -Will add long-acting OxyContin 10 mg by mouth  every 12 hours -Continue oxycodone IR 5 mg every 4 hours as needed for breakthrough pain -We'll consider adding low-dose dexamethasone based on tissue diagnosis. Would not want to start that right now since lymphoma and plasmacytoma are in the differential. -Given nausea medications as needed as well as when necessary laxatives for constipation  Protein calorie malnutrition Patient has lost 40 pounds since the beginning of this year due to anorexia and likely cancer related cachexia. We talked about multiple different treatment options for anorexia and noted that pain control was important. She does not have significant nausea at this time. Plan -We'll start the patient on Remeron 15 mg by mouth at bedtime which might address her insomnia, depression as well as so as an appetite stimulant. -We'll refer the patient to nutrition therapy -Patient has been using some ensure at home and was counseled to increase her caloric intake. -When necessary Zofran was provided for nausea management.   Anorexia Anorexia likely due to malignancy Plan -Management of underlying malignancy -Started Remeron 15 mg by mouth daily at bedtime. -Might consider adding Marinol and low-dose dexamethasone for appetite as needed -Dietary counseling done and referable given to nutrition therapy.   Heavy smoker (more than 20 cigarettes per day) Patient still smoking more than 2 packs of cigarettes a day. She notes that she is not emotionally in a good place to try to quit smoking at this time and does not want any specific inquiries or recommendations for this.  Plan -Offered the patient help with smoking cessation -Informed her that there are several things we can try if she is open to considering smoking cessation. -She wants to hold off on smoking cessation at this time given her anxieties about the diagnosis and management plan. -Emotional support provided.   Depression Depression and anxiety related to her  potential medical diagnosis, weight loss and anorexia. Plan -We shall address her symptom control aggressively -Supportive counseling provided -Social worker  referable given for additional supportive counseling and counseling regarding available wellness inputs. -Started the patient on Remeron 15 mg by mouth at bedtime -No suicidal or homicidal ideation -Daughter and significant other quite supportive and at clinic visit with her.  HTN (hypertension) Patient has a history of essential hypertension and had been on hydrochlorothiazide and amlodipine. She notes that she has not been taking her hydrochlorothiazide for a few weeks. Her blood pressure today was in the lower normal range. No orthostatic symptoms or symptoms of hypotension. Appears to be mildly dehydrated. Also her weight loss of 40 pounds has likely resulted in decreased need for antihypertensives. Her pain medications could also cause somewhat lower blood pressures. Plan -She was recommended discontinuation of her hydrochlorothiazide and amlodipine at this time. -Increase oral hydration and intake of potassium-rich liquids like orange juice, tomato juice Coconut water due to mild hypokalemia of 3.4. -Called with symptoms of dizziness or lightheadedness if present.    All questions were answered. The patient knows to call the clinic with any problems, questions or concerns. I spent 60 minutes counseling the patient face to face. The total time spent in the appointment was 80 minutes and more than 50% was on counseling.     Sullivan Lone, MD 06/24/2015 2:13 PM

## 2015-06-24 NOTE — Assessment & Plan Note (Signed)
Patient presents with left hip/groin pain since the beginning of the year and getting much worse in the last month. Had a CT of the abdomen and pelvis which showed a 9 cm aggressive appearing left ilium lesion with infiltration of the iliacus and gluteal muscles. This is concerning for a malignancy given the radiographic imaging findings, her 40 pound weight loss. The differential diagnosis would involve a primary bone tumor/sarcoma, lymphoma, plasmacytoma or less likely a metastatic lesion from elsewhere. Plan -We'll get baseline labs today including CBC, CMP, LDH -Myeloma labs with SPEP with immunofixation and quantitative immunoglobulins, serum kappa lambda free light chains, beta-2 microglobulin. -PT/INR/PTT needed for CT-guided biopsy. -Urgent CT-guided biopsy of the left  Ilium mass for diagnosis. Specimen would need to be sent for surgical pathology and lymphoma workup. Additional sample might be needed for flow cytometry, cytogenetics, FISH studies another molecular studies as indicated. -PET/CT scan for staging purposes -Return to his care with Dr. Irene Limbo 3-4 days after biopsy hopefully with the PET scan results

## 2015-06-24 NOTE — Assessment & Plan Note (Signed)
Patient has lost 40 pounds since the beginning of this year due to anorexia and likely cancer related cachexia. We talked about multiple different treatment options for anorexia and noted that pain control was important. She does not have significant nausea at this time. Plan -We'll start the patient on Remeron 15 mg by mouth at bedtime which might address her insomnia, depression as well as so as an appetite stimulant. -We'll refer the patient to nutrition therapy -Patient has been using some ensure at home and was counseled to increase her caloric intake. -When necessary Zofran was provided for nausea management.

## 2015-06-24 NOTE — Assessment & Plan Note (Signed)
Patient had excruciating pain in her left hip/buttocks due to an aggressive left ilium tumor. She notes that her current oxycodone IR 5 mg every 6 hours is not controlling her pain and that it wakes her up multiple times at night. She is requesting additional pain management which is appropriate. Plan -Will add long-acting OxyContin 10 mg by mouth every 12 hours -Continue oxycodone IR 5 mg every 4 hours as needed for breakthrough pain -We'll consider adding low-dose dexamethasone based on tissue diagnosis. Would not want to start that right now since lymphoma and plasmacytoma are in the differential. -Given nausea medications as needed as well as when necessary laxatives for constipation

## 2015-06-26 ENCOUNTER — Telehealth: Payer: Self-pay | Admitting: Hematology

## 2015-06-26 LAB — SPEP & IFE WITH QIG
ALPHA-1-GLOBULIN: 0.6 g/dL — AB (ref 0.2–0.3)
ALPHA-2-GLOBULIN: 1 g/dL — AB (ref 0.5–0.9)
Albumin ELP: 3.2 g/dL — ABNORMAL LOW (ref 3.8–4.8)
BETA GLOBULIN: 0.4 g/dL (ref 0.4–0.6)
Beta 2: 0.6 g/dL — ABNORMAL HIGH (ref 0.2–0.5)
Gamma Globulin: 1.3 g/dL (ref 0.8–1.7)
IgA: 390 mg/dL — ABNORMAL HIGH (ref 69–380)
IgG (Immunoglobin G), Serum: 1120 mg/dL (ref 690–1700)
IgM, Serum: 281 mg/dL (ref 52–322)
TOTAL PROTEIN, SERUM ELECTROPHOR: 7 g/dL (ref 6.1–8.1)

## 2015-06-26 LAB — SEDIMENTATION RATE: Sed Rate: 77 mm/hr — ABNORMAL HIGH (ref 0–30)

## 2015-06-26 LAB — PROTHROMBIN TIME
INR: 1.14 (ref <1.50)
Prothrombin Time: 14.6 seconds (ref 11.6–15.2)

## 2015-06-26 LAB — APTT: APTT: 36 s (ref 24–37)

## 2015-06-26 LAB — CK: CK TOTAL: 30 U/L (ref 7–177)

## 2015-06-26 LAB — KAPPA/LAMBDA LIGHT CHAINS
KAPPA FREE LGHT CHN: 5.11 mg/dL — AB (ref 0.33–1.94)
Kappa:Lambda Ratio: 1.56 (ref 0.26–1.65)
Lambda Free Lght Chn: 3.27 mg/dL — ABNORMAL HIGH (ref 0.57–2.63)

## 2015-06-26 NOTE — Telephone Encounter (Signed)
Returned patient call re rescheduling f/u due to bx and scan not until 7/26 and 7/27. F/u moved to 8/3. Patient aware. Message to [place].

## 2015-06-29 ENCOUNTER — Ambulatory Visit: Payer: Medicare Other | Admitting: Internal Medicine

## 2015-06-29 ENCOUNTER — Other Ambulatory Visit: Payer: Medicare Other

## 2015-06-29 ENCOUNTER — Ambulatory Visit: Payer: Medicare Other

## 2015-06-29 ENCOUNTER — Other Ambulatory Visit: Payer: Self-pay | Admitting: Radiology

## 2015-06-30 ENCOUNTER — Ambulatory Visit: Payer: Medicare Other | Admitting: Hematology

## 2015-06-30 ENCOUNTER — Encounter (HOSPITAL_COMMUNITY): Payer: Self-pay

## 2015-06-30 DIAGNOSIS — F17210 Nicotine dependence, cigarettes, uncomplicated: Secondary | ICD-10-CM | POA: Insufficient documentation

## 2015-06-30 DIAGNOSIS — M899 Disorder of bone, unspecified: Secondary | ICD-10-CM | POA: Diagnosis present

## 2015-06-30 DIAGNOSIS — R590 Localized enlarged lymph nodes: Secondary | ICD-10-CM | POA: Diagnosis not present

## 2015-06-30 DIAGNOSIS — C7951 Secondary malignant neoplasm of bone: Secondary | ICD-10-CM | POA: Insufficient documentation

## 2015-06-30 DIAGNOSIS — Z803 Family history of malignant neoplasm of breast: Secondary | ICD-10-CM | POA: Diagnosis not present

## 2015-06-30 DIAGNOSIS — R918 Other nonspecific abnormal finding of lung field: Secondary | ICD-10-CM | POA: Diagnosis not present

## 2015-06-30 DIAGNOSIS — E559 Vitamin D deficiency, unspecified: Secondary | ICD-10-CM | POA: Insufficient documentation

## 2015-06-30 DIAGNOSIS — Z79899 Other long term (current) drug therapy: Secondary | ICD-10-CM | POA: Insufficient documentation

## 2015-06-30 DIAGNOSIS — I10 Essential (primary) hypertension: Secondary | ICD-10-CM | POA: Diagnosis not present

## 2015-06-30 DIAGNOSIS — Z800 Family history of malignant neoplasm of digestive organs: Secondary | ICD-10-CM | POA: Insufficient documentation

## 2015-06-30 DIAGNOSIS — K219 Gastro-esophageal reflux disease without esophagitis: Secondary | ICD-10-CM | POA: Insufficient documentation

## 2015-06-30 DIAGNOSIS — K589 Irritable bowel syndrome without diarrhea: Secondary | ICD-10-CM | POA: Insufficient documentation

## 2015-06-30 DIAGNOSIS — R630 Anorexia: Secondary | ICD-10-CM | POA: Insufficient documentation

## 2015-06-30 DIAGNOSIS — C801 Malignant (primary) neoplasm, unspecified: Secondary | ICD-10-CM | POA: Insufficient documentation

## 2015-06-30 MED ORDER — LIDOCAINE-EPINEPHRINE 1 %-1:100000 IJ SOLN
INTRAMUSCULAR | Status: AC
  Filled 2015-06-30: qty 1

## 2015-06-30 MED ORDER — FENTANYL CITRATE (PF) 100 MCG/2ML IJ SOLN
INTRAMUSCULAR | Status: AC | PRN
  Administered 2015-06-30: 25 ug via INTRAVENOUS

## 2015-06-30 MED ORDER — SODIUM CHLORIDE 0.9 % IV SOLN
INTRAVENOUS | Status: DC
  Administered 2015-06-30: 10:00:00 via INTRAVENOUS

## 2015-06-30 MED ORDER — MIDAZOLAM HCL 2 MG/2ML IJ SOLN
INTRAMUSCULAR | Status: AC | PRN
  Administered 2015-06-30: 0.5 mg via INTRAVENOUS

## 2015-06-30 MED ORDER — MIDAZOLAM HCL 2 MG/2ML IJ SOLN
INTRAMUSCULAR | Status: AC
  Filled 2015-06-30: qty 4

## 2015-06-30 MED ORDER — FENTANYL CITRATE (PF) 100 MCG/2ML IJ SOLN
INTRAMUSCULAR | Status: AC
  Filled 2015-06-30: qty 4

## 2015-06-30 NOTE — H&P (Signed)
Chief Complaint: Patient was seen in consultation today for CT guided biopsy of left iliac mass at the kind request of [name]  Referring Physician(s): [name]  History of Present Illness: Cindy Byrd is a 74 y.o. female who first developed left hip and left groin pain about 2 years ago.  She states she just thought it was bursitis or arthritis.  She had an injection and states it really didn't help at all.  She currently rates her pain at a 0/10 while she is lying down, however when she tries to stand, her pain is instantly a 10/10.  She states her activity has decreased significantly and now she walks with a walker.   She is an active smoker, about 2 ppd.  She also reports some anorexia and has lost about 40 lbs this year.  Past Medical History  Diagnosis Date  . IBS (irritable bowel syndrome)   . Bone neoplasm 06/24/2015  . Hypertension 06/24/2015    likely improved incidental to 40 lbs weight loss from her neoplasm  . GERD (gastroesophageal reflux disease)   . Vitamin D deficiency disease   . Cigarette smoker two packs a day or less     Currently still smoking 2 PPD - Not interested in quitting at this time.  . Colon polyps     hyperplastic, tubular adenomas, tubulovillous adenoma  . Endometriosis     Hysterectomy with BSO at age 1 yrs  . H/O: pneumonia   . Heavy smoker (more than 20 cigarettes per day) 06/24/2015  . Depression 06/24/2015    Past Surgical History  Procedure Laterality Date  . Abdominal hysterectomy    . Tonsillectomy    . Ganglion cyst excision    . Total abdominal hysterectomy w/ bilateral salpingoophorectomy  at age 53 yrs    For endometriosis  . Knee arthroscopy  age about 78 yrs  . Bartholin gland cyst excision  74 yo ago    Does not want if it was an infected cyst or tumor. Was soon as delivery  . Colonoscopy w/ polypectomy      multiple times - last done 09/2014 per patient.    Allergies: Review of  patient's allergies indicates no known allergies.  Medications: Prior to Admission medications   Medication Sig Start Date End Date Taking? Authorizing Provider  amLODipine (NORVASC) 5 MG tablet Take 5 mg by mouth daily. For blood pressure   Yes Historical Provider, MD  bisoprolol (ZEBETA) 5 MG tablet Take 5 mg by mouth daily.   Yes Historical Provider, MD  hydrochlorothiazide (MICROZIDE) 12.5 MG capsule Take 12.5 mg by mouth daily. 04/25/15  Yes Historical Provider, MD  mirtazapine (REMERON) 15 MG tablet Take 1 tablet (15 mg total) by mouth at bedtime. 06/24/15  Yes [name], MD  omeprazole (PRILOSEC OTC) 20 MG tablet Take 20 mg by mouth daily.   Yes Historical Provider, MD  ondansetron (ZOFRAN) 4 MG tablet Take 1 tablet (4 mg total) by mouth every 8 (eight) hours as needed for nausea or vomiting. 06/24/15  Yes [name], MD  oxyCODONE-acetaminophen (PERCOCET/ROXICET) 5-325 MG per tablet Take 1 tablet by mouth every 4 (four) hours as needed for severe pain (pain).   Yes Historical Provider, MD  senna-docusate (SENOKOT-S) 8.6-50 MG per tablet Take 2 tablets by mouth at bedtime as needed for mild constipation. 06/24/15  Yes [name], MD  cyanocobalamin ([employer] VITAMIN B12) 2000 MCG tablet Take 1 tablet (2,000 mcg total)  by mouth daily. Patient not taking: Reported on 06/29/2015 06/24/15   [name], MD  oxyCODONE (OXY IR/ROXICODONE) 5 MG immediate release tablet Take 1 tablet (5 mg total) by mouth every 4 (four) hours as needed for severe pain. Patient not taking: Reported on 06/29/2015 06/24/15   [name], MD  OxyCODONE (OXYCONTIN) 10 mg T12A 12 hr tablet Take 1 tablet (10 mg total) by mouth every 12 (twelve) hours. Patient not taking: Reported on 06/29/2015 06/24/15   [name], MD     Family History  Problem Relation Age of Onset  . Stroke Mother   . Colon cancer Father   . Colon cancer Brother   . Colon cancer Brother   . Breast cancer  Daughter 49    ER/PR+ stage II    History   Social History  . Marital Status: Widowed    Spouse Name: N/A  . Number of Children: N/A  . Years of Education: N/A   Social History Main Topics  . Smoking status: Current Every Day Smoker -- 2.00 packs/day for 60 years    Types: Cigarettes  . Smokeless tobacco: Not on file  . Alcohol Use: No  . Drug Use: No  . Sexual Activity: No   Other Topics Concern  . None   Social History Narrative    ECOG Status: 2 - Symptomatic, <50% confined to bed   Review of Systems  Constitutional: Positive for activity change, appetite change and fatigue. Negative for fever and chills.  HENT: Negative.   Respiratory: Negative for apnea and shortness of breath.   Cardiovascular: Negative for chest pain.  Gastrointestinal: Negative for nausea, vomiting and abdominal pain.  Musculoskeletal: Positive for arthralgias and gait problem. Negative for back pain.  Skin: Negative.   Neurological: Negative.   Psychiatric/Behavioral: Negative.     Vital Signs: BP 92/60 mmHg  Pulse 67  Temp(Src) 98 F (36.7 C) (Oral)  Resp 16  Ht '5\' 8"'$  (1.727 m)  Wt 135 lb (61.236 kg)  BMI 20.53 kg/m2  SpO2 96%  Physical Exam  Constitutional: She is oriented to person, place, and time. She appears well-developed and well-nourished.  HENT:  Head: Atraumatic.  Eyes: EOM are normal.  Neck: Normal range of motion. Neck supple. No thyromegaly present.  Cardiovascular: Normal rate, regular rhythm and normal heart sounds.   No murmur heard. Pulmonary/Chest: Effort normal and breath sounds normal. She has no wheezes.  Abdominal: Soft. Bowel sounds are normal. She exhibits no distension. There is no tenderness.  Musculoskeletal: Normal range of motion.  Neurological: She is alert and oriented to person, place, and time.  Skin: Skin is warm and dry.  Psychiatric: She has a normal mood and affect. Her behavior is normal. Judgment and thought content normal.  Vitals  reviewed.   Mallampati Score:  MD Evaluation Airway: WNL Heart: WNL Abdomen: WNL Chest/ Lungs: WNL ASA  Classification: 3 Mallampati/Airway Score: One  Imaging: Ct Abdomen Pelvis W Contrast  06/12/2015   CLINICAL DATA:  Severe left groin and hip pain for 6 months.  EXAM: CT ABDOMEN AND PELVIS WITH CONTRAST  TECHNIQUE: Multidetector CT imaging of the abdomen and pelvis was performed using the standard protocol following bolus administration of intravenous contrast.  CONTRAST:  64m OMNIPAQUE IOHEXOL 300 MG/ML  SOLN  COMPARISON:  None.  FINDINGS: BODY WALL: No contributory findings.  LOWER CHEST: No contributory findings.  ABDOMEN/PELVIS:  Liver: Multiple low-dense hepatic lesions are most consistent with cysts. No evidence of solid  mass.  Biliary: No evidence of biliary obstruction or stone.  Pancreas: Unremarkable.  Spleen: Unremarkable.  Adrenals: Unremarkable.  Kidneys and ureters: No hydronephrosis or stone.  Bladder: Unremarkable.  Reproductive: Hysterectomy and probable oophorectomies. No adnexal mass.  Bowel: No obstruction. No appendicitis.  Retroperitoneum: No mass or adenopathy.  Peritoneum: No ascites or pneumoperitoneum.  Vascular: No acute abnormality. Multi focal atherosclerosis of the aorta and branch vessels.  OSSEOUS: Destructive lytic lesion encompassing the majority of the left iliac wing, measuring at least 9 cm in diameter and 5.5 cm in thickness. The lesion is centrally cystic and peripherally enhancing. There is invasion into the iliacus and gluteal musculature. No other bone lesion. No discrete fracture.  IMPRESSION: 9 cm aggressive lesion in the left ilium, infiltrating the iliacus and gluteal muscles. This could reflect a plasmacytoma, metastasis, or sarcoma.   Electronically Signed   By: Monte Fantasia M.D.   On: 06/12/2015 00:30    Labs:  CBC:  Recent Labs  06/11/15 2252 06/24/15 1325  WBC 8.9 10.0  HGB 13.4 13.2  HCT 40.5 39.1  PLT 204 208     COAGS:  Recent Labs  06/24/15 1325  INR 1.14  APTT 36    BMP:  Recent Labs  06/11/15 2252 06/24/15 1325  NA 138 136  K 3.4* 3.4*  CL 102  --   CO2 29 31*  GLUCOSE 103* 122  BUN 19 20.4  CALCIUM 9.6 10.2  CREATININE 0.87 1.0  GFRNONAA >60  --   GFRAA >60  --     LIVER FUNCTION TESTS:  Recent Labs  06/24/15 1325  BILITOT 0.45  AST 13  ALT 7  ALKPHOS 102  PROT 7.2  ALBUMIN 2.9*    TUMOR MARKERS: No results for input(s): AFPTM, CEA, CA199, CHROMGRNA in the last 8760 hours.  Assessment and Plan:  Left Iliac Mass  Will proceed with CT guided biopsy of left iliac mass today.  Risks and Benefits discussed with the patient including, but not limited to bleeding, infection, damage to adjacent structures or low yield requiring additional tests. All of the patient's questions were answered, patient is agreeable to proceed. Consent signed and in chart.   Thank you for this interesting consult.  I greatly enjoyed meeting Cindy Byrd and look forward to participating in their care.  A copy of this report was sent to the requesting provider on this date.  Signed: Murrell Redden PA-C 06/30/2015, 10:27 AM   I spent a total of  30 Minutes in face to face in clinical consultation, greater than 50% of which was counseling/coordinating care for CT guided biopsy of left iliac mass.

## 2015-06-30 NOTE — Sedation Documentation (Signed)
Patient denies pain and is resting comfortably.  

## 2015-06-30 NOTE — Procedures (Signed)
Technically successful CT guided biopsy of indeterminate expansile mass involving the left ilium.  No immediate post procedural complications.

## 2015-06-30 NOTE — Discharge Instructions (Signed)
Needle Biopsy °Care After °These instructions give you information on caring for yourself after your procedure. Your doctor may also give you more specific instructions. Call your doctor if you have any problems or questions after your procedure. °HOME CARE °· Rest for 4 hours after your biopsy, except for getting up to go to the bathroom or as told. °· Keep the places where the needles were put in clean and dry. °¨ Do not put powder or lotion on the sites. °¨ Do not shower until 24 hours after the test. Remove all bandages (dressings) before showering. °¨ Remove all bandages at least once every day. Gently clean the sites with soap and water. Keep putting a new bandage on until the skin is closed. °Finding out the results of your test °Ask your doctor when your test results will be ready. Make sure you follow up and get the test results. °GET HELP RIGHT AWAY IF:  °· You have shortness of breath or trouble breathing. °· You have pain or cramping in your belly (abdomen). °· You feel sick to your stomach (nauseous) or throw up (vomit). °· Any of the places where the needles were put in: °¨ Are puffy (swollen) or red. °¨ Are sore or hot to the touch. °¨ Are draining yellowish-white fluid (pus). °¨ Are bleeding after 10 minutes of pressing down on the site. Have someone keep pressing on any place that is bleeding until you see a doctor. °· You have any unusual pain that will not stop. °· You have a fever. °If you go to the emergency room, tell the nurse that you had a biopsy. Take this paper with you to show the nurse. °MAKE SURE YOU:  °· Understand these instructions. °· Will watch your condition. °· Will get help right away if you are not doing well or get worse. °Document Released: 11/03/2008 Document Revised: 02/13/2012 Document Reviewed: 11/03/2008 °[employer]® Patient Information ©2015 [employer]. This information is not intended to replace advice given to you by your health care provider. Make sure you discuss  any questions you have with your health care provider. ° °

## 2015-07-01 ENCOUNTER — Encounter (HOSPITAL_COMMUNITY)
Admission: RE | Admit: 2015-07-01 | Discharge: 2015-07-01 | Disposition: A | Discharge status: Home / Self Care | Payer: Medicare Other | Source: Ambulatory Visit | Attending: Hematology | Admitting: Hematology

## 2015-07-01 ENCOUNTER — Ambulatory Visit (HOSPITAL_COMMUNITY)
Admission: RE | Admit: 2015-07-01 | Discharge: 2015-07-01 | Disposition: A | Discharge status: Home / Self Care | Payer: Medicare Other | Source: Ambulatory Visit | Attending: Hematology | Admitting: Hematology

## 2015-07-01 DIAGNOSIS — D492 Neoplasm of unspecified behavior of bone, soft tissue, and skin: Secondary | ICD-10-CM

## 2015-07-01 DIAGNOSIS — C7951 Secondary malignant neoplasm of bone: Secondary | ICD-10-CM | POA: Diagnosis not present

## 2015-07-01 DIAGNOSIS — E46 Unspecified protein-calorie malnutrition: Secondary | ICD-10-CM

## 2015-07-01 LAB — GLUCOSE, CAPILLARY: Glucose-Capillary: 93 mg/dL (ref 65–99)

## 2015-07-01 MED ORDER — FLUDEOXYGLUCOSE F - 18 (FDG) INJECTION
6.4800 | Freq: Once | INTRAVENOUS | Status: AC | PRN
  Administered 2015-07-01: 6.48 via INTRAVENOUS

## 2015-07-06 ENCOUNTER — Other Ambulatory Visit: Payer: Self-pay | Admitting: Hematology

## 2015-07-06 ENCOUNTER — Telehealth: Payer: Self-pay | Admitting: Hematology

## 2015-07-06 ENCOUNTER — Ambulatory Visit (HOSPITAL_BASED_OUTPATIENT_CLINIC_OR_DEPARTMENT_OTHER): Payer: Medicare Other | Admitting: Hematology

## 2015-07-06 ENCOUNTER — Encounter: Payer: Self-pay | Admitting: Hematology

## 2015-07-06 VITALS — BP 121/56 | HR 70 | Temp 98.4°F | Resp 18 | Ht 68.0 in | Wt 135.3 lb

## 2015-07-06 DIAGNOSIS — F329 Major depressive disorder, single episode, unspecified: Secondary | ICD-10-CM

## 2015-07-06 DIAGNOSIS — R1013 Epigastric pain: Secondary | ICD-10-CM

## 2015-07-06 DIAGNOSIS — F32A Depression, unspecified: Secondary | ICD-10-CM

## 2015-07-06 DIAGNOSIS — Z720 Tobacco use: Secondary | ICD-10-CM

## 2015-07-06 DIAGNOSIS — C7951 Secondary malignant neoplasm of bone: Secondary | ICD-10-CM

## 2015-07-06 DIAGNOSIS — R1010 Upper abdominal pain, unspecified: Secondary | ICD-10-CM

## 2015-07-06 DIAGNOSIS — C801 Malignant (primary) neoplasm, unspecified: Principal | ICD-10-CM

## 2015-07-06 MED ORDER — DEXAMETHASONE 2 MG PO TABS: 2.0000 mg | ORAL_TABLET | Freq: Every day | ORAL | Status: DC

## 2015-07-06 NOTE — Patient Instructions (Signed)
Radiation Oncology Appointment MRI brain for staging Smoking cessation Pain management with oxycontin + oxycodone Will decide on further treatment based on mutation studies

## 2015-07-06 NOTE — Telephone Encounter (Signed)
Family came in today for their follow up appointment.  The patient is actually on schedule for 8/3 and states that she did not get the message on 7/22 from [place].  After speaking with Delle Reining she states that the results are in and he can see her at 4:00

## 2015-07-07 ENCOUNTER — Telehealth: Payer: Self-pay | Admitting: Hematology

## 2015-07-07 NOTE — Telephone Encounter (Signed)
s.w. pt and advised on 8.16 appt.[name] KitchenMarland KitchenMarland KitchenMarland Kitchenpt ok and aware

## 2015-07-08 ENCOUNTER — Encounter: Payer: Self-pay | Admitting: Hematology

## 2015-07-08 ENCOUNTER — Ambulatory Visit: Payer: Medicare Other | Admitting: Hematology

## 2015-07-08 NOTE — Progress Notes (Signed)
Cindy Byrd    HEMATOLOGY ONCOLOGY PROGRESS NOTE  Date of service: 07/06/2015  Patient Care Team: Tamsen Roers, MD as PCP - General (Family Medicine) [name], MD as Consulting Physician (Hematology and Oncology)  CHIEF COMPLAINTS/PURPOSE OF CONSULTATION:  Left hip and groin pain with CT scan showing aggressive left ilium neoplastic lesion.  HISTORY OF PRESENTING ILLNESS: (plz see my previous consultation for details of initial presentation)  INTERVAL HISTORY  Cindy Byrd he is here with her husband and son for followup of her workup and pathology results.  We diagnosed the findings on her PET/CT scan and her pathology results.  She was explained that this represented a metastatic poorly differentiated carcinoma likely of lung origin.  She notes that she is having some dyspepsia upper abdominal discomfort and therefore we decided to get an EGD to rule out upper GI source which might also have a similar immunohistochemical pattern.  Patient notes that oxycodone is helping with her hip pain.  She has not been using her OxyContin and did not fill it due to cost issues.  Shee has been given a social work referral. I also printed out this stone coupons for [facility] to help he afford.  We talked about smoking cessation with the patient is not very keen to quit.  No other acute new symptoms.  MEDICAL HISTORY:  Past Medical History  Diagnosis Date  . IBS (irritable bowel syndrome)   . Bone neoplasm 06/24/2015  . Hypertension 06/24/2015    likely improved incidental to 40 lbs weight loss from her neoplasm  . GERD (gastroesophageal reflux disease)   . Vitamin D deficiency disease   . Cigarette smoker two packs a day or less     Currently still smoking 2 PPD - Not interested in quitting at this time.  . Colon polyps     hyperplastic, tubular adenomas, tubulovillous adenoma  . Endometriosis     Hysterectomy with BSO at age 52 yrs  . H/O: pneumonia   . Heavy smoker (more than 20 cigarettes per  day) 06/24/2015  . Depression 06/24/2015   SURGICAL HISTORY: Past Surgical History  Procedure Laterality Date  . Abdominal hysterectomy    . Tonsillectomy    . Ganglion cyst excision    . Total abdominal hysterectomy w/ bilateral salpingoophorectomy  at age 26 yrs    For endometriosis  . Knee arthroscopy  age about 61 yrs  . Bartholin gland cyst excision  74 yo ago    Does not want if it was an infected cyst or tumor. Was soon as delivery  . Colonoscopy w/ polypectomy      multiple times - last done 09/2014 per patient.    SOCIAL HISTORY: History   Social History  . Marital Status: Widowed    Spouse Name: N/A  . Number of Children: N/A  . Years of Education: N/A   Occupational History  . Not on file.   Social History Main Topics  . Smoking status: Current Every Day Smoker -- 2.00 packs/day for 60 years    Types: Cigarettes  . Smokeless tobacco: Not on file  . Alcohol Use: No  . Drug Use: No  . Sexual Activity: No   Other Topics Concern  . Not on file   Social History Narrative    FAMILY HISTORY: Family History  Problem Relation Age of Onset  . Stroke Mother   . Colon cancer Father   . Colon cancer Brother   . Colon cancer Brother   .  Breast cancer Daughter 54    ER/PR+ stage II    ALLERGIES:  has No Known Allergies. patient wonders if she has a penicillin allergy but notes that she is uncertain about this.  MEDICATIONS:  Current Outpatient Prescriptions  Medication Sig Dispense Refill  . amLODipine (NORVASC) 5 MG tablet Take 5 mg by mouth daily. For blood pressure    . bisoprolol (ZEBETA) 5 MG tablet Take 5 mg by mouth daily.    . hydrochlorothiazide (MICROZIDE) 12.5 MG capsule Take 12.5 mg by mouth daily.  2  . mirtazapine (REMERON) 15 MG tablet Take 1 tablet (15 mg total) by mouth at bedtime. 30 tablet 1  . omeprazole (PRILOSEC OTC) 20 MG tablet Take 20 mg by mouth daily.    . ondansetron (ZOFRAN) 4 MG tablet Take 1 tablet (4 mg total) by mouth every 8  (eight) hours as needed for nausea or vomiting. 30 tablet 0  . senna-docusate (SENOKOT-S) 8.6-50 MG per tablet Take 2 tablets by mouth at bedtime as needed for mild constipation. 30 tablet 1  . dexamethasone (DECADRON) 2 MG tablet Take 1 tablet (2 mg total) by mouth daily. 30 tablet 0  . oxyCODONE (OXY IR/ROXICODONE) 5 MG immediate release tablet Take 1 tablet (5 mg total) by mouth every 4 (four) hours as needed for severe pain. (Patient not taking: Reported on 06/29/2015) 60 tablet 0  . OxyCODONE (OXYCONTIN) 10 mg T12A 12 hr tablet Take 1 tablet (10 mg total) by mouth every 12 (twelve) hours. (Patient not taking: Reported on 06/29/2015) 60 tablet 0   No current facility-administered medications for this visit.    REVIEW OF SYSTEMS:   Constitutional: Denies fevers, chills or abnormal night sweats Eyes: Denies blurriness of vision, double vision or watery eyes Ears, nose, mouth, throat, and face: Denies mucositis or sore throat Respiratory: Denies cough, dyspnea or wheezes Cardiovascular: Denies palpitation, chest discomfort or lower extremity swelling Gastrointestinal:  Denies nausea, heartburn or change in bowel habits Skin: Denies abnormal skin rashes Lymphatics: Denies new lymphadenopathy or easy bruising Neurological:Denies numbness, tingling or new weaknesses Behavioral/Psych: Mildly depressed affect ,Mood is stable no suicidal or homicidal ideation   All other systems were reviewed with the patient and are negative.   PHYSICAL EXAMINATION: ECOG PERFORMANCE STATUS: 2 - Symptomatic, <50% confined to bed  Filed Vitals:   07/06/15 1558  BP: 121/56  Pulse: 70  Temp: 98.4 F (36.9 C)  Resp: 18   Filed Weights   07/06/15 1558  Weight: 135 lb 4.8 oz (61.372 kg)    GENERAL:alert, mild distress from pain and anxiety. SKIN: skin color, texture, turgor are normal, no rashes or significant lesions EYES: normal, conjunctiva are pink and non-injected, sclera clear OROPHARYNX:no  exudate, no erythema and lips, buccal mucosa, and tongue normal  NECK: supple, thyroid normal size, non-tender, without nodularity LYMPH:  no palpable lymphadenopathy in the cervical, axillary or inguinal LUNGS: clear to auscultation and bilateral reduced breath sounds with normal respiratory effort  HEART: regular rate & rhythm and no murmurs and no lower extremity edema ABDOMEN:abdomen soft, non-tender and normal bowel sounds MusculoskeletalNo pedal edema. No calf pain or tenderness. Fullness over the left lateral hip and gluteal area with no overt skin changes. PSYCH: alert & oriented x 3 with fluent speech NEURO: no focal motor/sensory deficits  LABORATORY DATA:  I have reviewed the data as listed Lab Results  Component Value Date   WBC 10.0 06/24/2015   HGB 13.2 06/24/2015   HCT 39.1 06/24/2015  MCV 87.3 06/24/2015   PLT 208 06/24/2015    Recent Labs  06/11/15 2252 06/24/15 1325  NA 138 136  K 3.4* 3.4*  CL 102  --   CO2 29 31*  GLUCOSE 103* 122  BUN 19 20.4  CREATININE 0.87 1.0  CALCIUM 9.6 10.2  GFRNONAA >60  --   GFRAA >60  --   PROT  --  7.2  ALBUMIN  --  2.9*  AST  --  13  ALT  --  7  ALKPHOS  --  102  BILITOT  --  0.45   PATHOLOGY: 06/30/2015:   RADIOGRAPHIC STUDIES: I have personally reviewed the radiological images as listed and agreed with the findings in the report. Ct Abdomen Pelvis W Contrast  06/12/2015   CLINICAL DATA:  Severe left groin and hip pain for 6 months.  EXAM: CT ABDOMEN AND PELVIS WITH CONTRAST  TECHNIQUE: Multidetector CT imaging of the abdomen and pelvis was performed using the standard protocol following bolus administration of intravenous contrast.  CONTRAST:  32m OMNIPAQUE IOHEXOL 300 MG/ML  SOLN  COMPARISON:  None.  FINDINGS: BODY WALL: No contributory findings.  LOWER CHEST: No contributory findings.  ABDOMEN/PELVIS:  Liver: Multiple low-dense hepatic lesions are most consistent with cysts. No evidence of solid mass.  Biliary: No  evidence of biliary obstruction or stone.  Pancreas: Unremarkable.  Spleen: Unremarkable.  Adrenals: Unremarkable.  Kidneys and ureters: No hydronephrosis or stone.  Bladder: Unremarkable.  Reproductive: Hysterectomy and probable oophorectomies. No adnexal mass.  Bowel: No obstruction. No appendicitis.  Retroperitoneum: No mass or adenopathy.  Peritoneum: No ascites or pneumoperitoneum.  Vascular: No acute abnormality. Multi focal atherosclerosis of the aorta and branch vessels.  OSSEOUS: Destructive lytic lesion encompassing the majority of the left iliac wing, measuring at least 9 cm in diameter and 5.5 cm in thickness. The lesion is centrally cystic and peripherally enhancing. There is invasion into the iliacus and gluteal musculature. No other bone lesion. No discrete fracture.  IMPRESSION: 9 cm aggressive lesion in the left ilium, infiltrating the iliacus and gluteal muscles. This could reflect a plasmacytoma, metastasis, or sarcoma.   Electronically Signed   By: JMonte FantasiaM.D.   On: 06/12/2015 00:30   Nm Pet Image Initial (pi) Skull Base To Thigh  07/01/2015   CLINICAL DATA:  Initial treatment strategy for metastatic cancer of unknown primary.  EXAM: NUCLEAR MEDICINE PET SKULL BASE TO THIGH  TECHNIQUE: 6.5 mCi F-18 FDG was injected intravenously. Full-ring PET imaging was performed from the skull base to thigh after the radiotracer. CT data was obtained and used for attenuation correction and anatomic localization.  FASTING BLOOD GLUCOSE:  Value: 93 . Mg/dl  COMPARISON:  Abdomen and pelvis CT 06/12/2019  FINDINGS: NECK  Right cervical lymph node is not enlarged, but demonstrates hypermetabolic uptake with SUV max = 3.0. No other hypermetabolic lymphadenopathy in the neck.  CHEST  Hypermetabolic mediastinal lymphadenopathy is evident. 12 mm short axis prevascular lymph node demonstrates SUV max = 6.0. 16 mm short axis precarinal lymph node (image 70 series 4) is hypermetabolic with SUV max = 9.9.  A  small hypermetabolic lymph node is seen in the inferior left hilum, along the left inferior pulmonary vein (see image 84 series 4) with SUV max = 3.2.  Hypermetabolic nodules are seen in the left lower lobe. There is an area of focal architectural distortion in the left lower lobe with an irregular 6 x 16 mm spiculated nodule demonstrating SUV max =  2.5.  ABDOMEN/PELVIS  No abnormal hypermetabolic activity within the liver, pancreas, adrenal glands, or spleen.  There is some scattered FDG accumulation in the mid transverse colon, but no underlying mass lesion is evident at this location to suggest neoplasm. Left common iliac lymph node seen on image 148 of series 4 measures 9 mm in short axis and demonstrates SUV max = 3.4. Bilateral nonenlarged inguinal lymph nodes show low level FDG uptake. No other hypermetabolic lymphadenopathy is seen in the abdomen or pelvis.  SKELETON  The large necrotic left iliac lesion is markedly hypermetabolic with SUV max = 88.8.  IMPRESSION: 1. Hypermetabolic lymph nodes in the neck, chest, and anatomic pelvis. Imaging features are consistent with metastatic disease. 2. 6 x 16 mm spiculated left lower lobe pulmonary nodule image area of architectural distortion. CT imaging features of this nodule would be compatible with a primary bronchogenic neoplasm and correlation with recent left iliac biopsy results may prove helpful. There is associated hypermetabolic small left lower lobe nodule adjacent and hypermetabolic lymph node in the inferior left hilum. As mentioned above, markedly hypermetabolic mediastinal lymphadenopathy is present. 3. The large, necrotic left iliac bone lesion is markedly hypermetabolic and no other hypermetabolic bone lesions are evident.   Electronically Signed   By: Misty Stanley M.D.   On: 07/01/2015 09:55   Ct Biopsy  06/30/2015   INDICATION: Unknown primary, now with expansile mass involving the left ilium. Please perform CT-guided biopsy for tissue  diagnostic purposes  EXAM: CT-GUIDED BIOPSY OF INDETERMINATE EXPANSILE MASS INVOLVING THE LEFT ILIUM  COMPARISON:  CT the abdomen pelvis - 06/22/2015  MEDICATIONS: Fentanyl 25 mcg IV; Versed 0.5 mg IV  ANESTHESIA/SEDATION: Sedation time  8 minutes  CONTRAST:  None  COMPLICATIONS: None immediate  PROCEDURE: Informed consent was obtained from the patient following an explanation of the procedure, risks, benefits and alternatives. A time out was performed prior to the initiation of the procedure.  The patient was positioned supine on the CT table and a limited CT was performed for procedural planning demonstrating a tree in size and appearance of the expansile lytic lesion involving in the left ilium with dominant component measuring at least 10.6 x 5.2 cm (image 5, series 2). The procedure was planned. The operative site was prepped and draped in the usual sterile fashion. Appropriate trajectory was confirmed with a 22 gauge spinal needle after the adjacent tissues were anesthetized with 1% Lidocaine with epinephrine.  Under intermittent CT guidance, a 17 gauge coaxial needle was advanced into the peripheral aspect of the mass.  Appropriate positioning was confirmed and 5 core needle biopsy samples were obtained with an 18 gauge core needle biopsy device. The co-axial needle was removed and hemostasis was achieved with manual compression.  A limited postprocedural CT was negative for hemorrhage or additional complication. A dressing was placed. The patient tolerated the procedure well without immediate postprocedural complication.  IMPRESSION: Technically successful CT guided core needle biopsy of indeterminate expansile lytic lesion involving the left ilium.   Electronically Signed   By: Sandi Mariscal M.D.   On: 06/30/2015 16:11    ASSESSMENT & PLAN:   #1 Metastatic poorly differentiated carcinoma with likely lung primary [non-small cell lung cancer].  Less likely would possibly upper GI. Plan -gastroenterology   referral given for EGD especially since she is having a little bit of upper abdominal discomfort and dyspepsia. -MRI of brain to complete staging workup -A radiation oncology consult for palliative radiation of left ilium mass which  is quite painful ASAP -Continue OxyContin twice a day and oxycodone when necessary for pain control, senna S for bowel prophylaxis. -Added dexamethasone 2 mg by mouth daily for appetite and pain control. -Discussed with pathology about getting a EGFR mutation/ALK gene rearrangement and Ros 1 mutation studies, however could not be done since the tissue available was decalcified and cannot be used for accurate molecular studies. -Discussed smoking cessation -after completion of palliative radiation will need systemic therapy for her metastatic carcinoma.  We will consider a platinum doublet versus Nivolumab. -We will discuss with patient regarding possibility of additional biopsy to allow for the molecular studies if EGD unrevealing. -all this data was discussed in detail with the patientincluding the understanding that all treatment would serve for disease control and palliation alone and would not be curative. -will likely benefit from home palliative care services.  Return to clinic in 2 weeks with Dr. Irene Limbo  All questions were answered. The patient knows to call the clinic with any problems, questions or concerns. I spent 35 minutes counseling the patient face to face. The total time spent in the appointment was 45 minutes and more than 50% was on counseling.     Sullivan Lone, [place] [facility] Physician

## 2015-07-09 ENCOUNTER — Encounter: Payer: Self-pay | Admitting: Radiation Oncology

## 2015-07-09 NOTE — Progress Notes (Signed)
Histology and Location of Primary Cancer: metastatic poorly differentiated carcinoma likely of lung origin  Sites of Visceral and Bony Metastatic Disease: large, necrotic left iliac bone lesion is markedly hypermetabolic and no other hypermetabolic bone lesions are evident  Location(s) of Symptomatic Metastases: left hip and groin pain with CT scan revealing aggressive left ilium neoplastic lesion.  Past/Anticipated chemotherapy by medical oncology, if any: after completion of palliative radiation Dr. Kale will consider a platinum doublet vs. Nivolumab  Pain on a scale of 0-10 is: Taking oxycontin twice a day and oxycodone when necessary for pain control.   If Spine Met(s), symptoms, if any, include:  Bowel/Bladder retention or incontinence (please describe):   Numbness or weakness in extremities (please describe):   Current Decadron regimen, if applicable: decadron 2 mg daily  Ambulatory status? Walker? Wheelchair?:   SAFETY ISSUES:  Prior radiation? no  Pacemaker/ICD? no  Possible current pregnancy? no  Is the patient on methotrexate? no  Current Complaints / other details: 74 year old female. 2 ppd smoker. C/O dyspepsia and upper abdominal discomfort with a strong family his of GI ca.    

## 2015-07-10 NOTE — Telephone Encounter (Signed)
I called Mrs. No to see how she was doing. She notes that she is much more comfortable and that her pain is much better controlled currently. Notes that the dexamethasone has improved her appetite and she feels more mentally focused.  She notes that she is too claustrophobic to get the MRI of the brain to complete staging. She was offered sedated so and anxiolytics but still feels that she will not be able to do it. Is okay with trying to do CT of the head with and without contrast instead. Orders were change accordingly.  Has radiation oncology follow-up with Dr. Tammi Klippel on 07/13/2015 to consider palliative radiation to her left ilium.  I discussed with her that the pathologist cannot do mutation studies on her decalcified biopsy tissue. She wants to wait to complete her EGD to determine if additional tumor is found and can be biopsied. If not we will have to decide on a repeat biopsy of an alternative tumor site that might be accessible by IR or pulmonary.  Sullivan Lone MD [place] [facility] Physician [facility]  (Office):       [phone] (Work cell):  [phone] (Fax):           [phone]

## 2015-07-13 ENCOUNTER — Ambulatory Visit
Admission: RE | Admit: 2015-07-13 | Discharge: 2015-07-13 | Disposition: A | Discharge status: Home / Self Care | Payer: Medicare Other | Source: Ambulatory Visit | Attending: Radiation Oncology | Admitting: Radiation Oncology

## 2015-07-13 ENCOUNTER — Encounter: Payer: Self-pay | Admitting: Radiation Oncology

## 2015-07-13 VITALS — BP 154/56 | HR 44 | Resp 16 | Ht 68.0 in | Wt 137.1 lb

## 2015-07-13 DIAGNOSIS — F17210 Nicotine dependence, cigarettes, uncomplicated: Secondary | ICD-10-CM | POA: Diagnosis not present

## 2015-07-13 DIAGNOSIS — M25552 Pain in left hip: Secondary | ICD-10-CM | POA: Insufficient documentation

## 2015-07-13 DIAGNOSIS — E559 Vitamin D deficiency, unspecified: Secondary | ICD-10-CM | POA: Insufficient documentation

## 2015-07-13 DIAGNOSIS — Z6820 Body mass index (BMI) 20.0-20.9, adult: Secondary | ICD-10-CM | POA: Diagnosis not present

## 2015-07-13 DIAGNOSIS — Z800 Family history of malignant neoplasm of digestive organs: Secondary | ICD-10-CM | POA: Insufficient documentation

## 2015-07-13 DIAGNOSIS — C801 Malignant (primary) neoplasm, unspecified: Secondary | ICD-10-CM | POA: Diagnosis not present

## 2015-07-13 DIAGNOSIS — Z79899 Other long term (current) drug therapy: Secondary | ICD-10-CM | POA: Diagnosis not present

## 2015-07-13 DIAGNOSIS — R630 Anorexia: Secondary | ICD-10-CM | POA: Insufficient documentation

## 2015-07-13 DIAGNOSIS — Z803 Family history of malignant neoplasm of breast: Secondary | ICD-10-CM | POA: Diagnosis not present

## 2015-07-13 DIAGNOSIS — Z510 Encounter for antineoplastic radiation therapy: Secondary | ICD-10-CM | POA: Diagnosis present

## 2015-07-13 DIAGNOSIS — I10 Essential (primary) hypertension: Secondary | ICD-10-CM | POA: Insufficient documentation

## 2015-07-13 DIAGNOSIS — Z823 Family history of stroke: Secondary | ICD-10-CM | POA: Insufficient documentation

## 2015-07-13 DIAGNOSIS — K219 Gastro-esophageal reflux disease without esophagitis: Secondary | ICD-10-CM | POA: Insufficient documentation

## 2015-07-13 DIAGNOSIS — C7951 Secondary malignant neoplasm of bone: Secondary | ICD-10-CM | POA: Diagnosis not present

## 2015-07-13 DIAGNOSIS — Z880 Allergy status to penicillin: Secondary | ICD-10-CM | POA: Diagnosis not present

## 2015-07-13 DIAGNOSIS — D492 Neoplasm of unspecified behavior of bone, soft tissue, and skin: Secondary | ICD-10-CM

## 2015-07-13 NOTE — Progress Notes (Signed)
See progress note under physician encounter. 

## 2015-07-13 NOTE — Progress Notes (Signed)
Radiation Oncology         (336) [phone] ________________________________  Initial Outpatient Consultation  Name: Cindy Byrd MRN: [id number]  Date: 07/13/2015  DOB: 1941-07-30  YB:OFBPZW,CHENI, MD  [name], MD   REFERRING PHYSICIAN: [name], MD  DIAGNOSIS: The encounter diagnosis was Bone neoplasm.    ICD-9-CM ICD-10-CM   1. Bone neoplasm 239.2 D49.2      HISTORY OF PRESENT ILLNESS::Cindy Byrd is a 74 y.o. female who was diagnosed with metastatic poorly differentiated carcinoma possibly of lung origin. Patient first developed left hip and left groin pain about 2 years ago. Patient visited the ER on 06/11/15 due to worsening pain in these areas. On 06/12/15, the patient underwent a CT of the abdomen and pelvis which revealed a 9 cm aggressive lesion in the left ilium, infiltrating the iliacus and gluteal muscles. Biopsy of the left iliac mass revealed a poorly differentiated carcinoma. PET scan on 07/01/15, revealed hypermetabolic lymph nodes in the neck, chest, pelvis consistent with metastatic disease. In addition, a 16 mm left lower lobe pulmonary nodule was found. The necrotic left iliac lesion is markedly hypermetabolic with SUV max = 77.8.   She states her activity has decreased significantly and now she walks with a walker. She is an active smoker, about 2 ppd.  She also reports some anorexia and has lost about 40 lbs this year.  Taking oxycontin twice a day and oxycodone when necessary for pain control.   PREVIOUS RADIATION THERAPY: No  PAST MEDICAL HISTORY:  has a past medical history of IBS (irritable bowel syndrome); Bone neoplasm (06/24/2015); Hypertension (06/24/2015); GERD (gastroesophageal reflux disease); Vitamin D deficiency disease; Cigarette smoker two packs a day or less; Colon polyps; Endometriosis; H/O: pneumonia; Heavy smoker (more than 20 cigarettes per day) (06/24/2015); Depression (06/24/2015); and Cancer.    PAST SURGICAL  HISTORY: Past Surgical History  Procedure Laterality Date  . Abdominal hysterectomy    . Tonsillectomy    . Ganglion cyst excision    . Total abdominal hysterectomy w/ bilateral salpingoophorectomy  at age 42 yrs    For endometriosis  . Knee arthroscopy  age about 56 yrs  . Bartholin gland cyst excision  74 yo ago    Does not want if it was an infected cyst or tumor. Was soon as delivery  . Colonoscopy w/ polypectomy      multiple times - last done 09/2014 per patient.    FAMILY HISTORY: family history includes Breast cancer (age of onset: 56) in her daughter; Colon cancer in her brother, brother, and father; Stroke in her mother.  SOCIAL HISTORY:  History   Social History  . Marital Status: Widowed    Spouse Name: N/A  . Number of Children: N/A  . Years of Education: N/A   Occupational History  . Not on file.   Social History Main Topics  . Smoking status: Current Every Day Smoker -- 2.00 packs/day for 60 years    Types: Cigarettes  . Smokeless tobacco: Never Used  . Alcohol Use: No  . Drug Use: No  . Sexual Activity: No   Other Topics Concern  . Not on file   Social History Narrative    ALLERGIES: Penicillins  MEDICATIONS:  Current Outpatient Prescriptions  Medication Sig Dispense Refill  . amLODipine (NORVASC) 5 MG tablet Take 5 mg by mouth daily. For blood pressure    . bisoprolol (ZEBETA) 5 MG tablet Take 5 mg by mouth daily.    [name]  dexamethasone (DECADRON) 2 MG tablet Take 1 tablet (2 mg total) by mouth daily. 30 tablet 0  . hydrochlorothiazide (MICROZIDE) 12.5 MG capsule Take 12.5 mg by mouth daily.  2  . omeprazole (PRILOSEC OTC) 20 MG tablet Take 20 mg by mouth daily.    [name] oxyCODONE (OXY IR/ROXICODONE) 5 MG immediate release tablet Take 1 tablet (5 mg total) by mouth every 4 (four) hours as needed for severe pain. 60 tablet 0  . OxyCODONE (OXYCONTIN) 10 mg T12A 12 hr tablet Take 1 tablet (10 mg total) by mouth every 12 (twelve) hours. 60 tablet 0  .  senna-docusate (SENOKOT-S) 8.6-50 MG per tablet Take 2 tablets by mouth at bedtime as needed for mild constipation. 30 tablet 1  . mirtazapine (REMERON) 15 MG tablet Take 1 tablet (15 mg total) by mouth at bedtime. (Patient not taking: Reported on 07/13/2015) 30 tablet 1  . ondansetron (ZOFRAN) 4 MG tablet Take 1 tablet (4 mg total) by mouth every 8 (eight) hours as needed for nausea or vomiting. (Patient not taking: Reported on 07/13/2015) 30 tablet 0   No current facility-administered medications for this encounter.    REVIEW OF SYSTEMS:  A 15 point review of systems is documented in the electronic medical record. This was obtained by the nursing staff. However, I reviewed this with the patient to discuss relevant findings and make appropriate changes.  Pertinent items are noted in HPI.   PHYSICAL EXAM:  height is '5\' 8"'$  (1.727 m) and weight is 137 lb 1.6 oz (62.188 kg). Her blood pressure is 154/56 and her pulse is 44. Her respiration is 16 and oxygen saturation is 100%.  Patient is thin and moderate discomfort seated in a chair.  Respiratory effort unremarkable.  Per med-onc: GENERAL:alert, mild distress from pain and anxiety. SKIN: skin color, texture, turgor are normal, no rashes or significant lesions EYES: normal, conjunctiva are pink and non-injected, sclera clear OROPHARYNX:no exudate, no erythema and lips, buccal mucosa, and tongue normal  NECK: supple, thyroid normal size, non-tender, without nodularity LYMPH:  no palpable lymphadenopathy in the cervical, axillary or inguinal LUNGS: clear to auscultation and bilateral reduced breath sounds with normal respiratory effort  HEART: regular rate & rhythm and no murmurs and no lower extremity edema ABDOMEN:abdomen soft, non-tender and normal bowel sounds MusculoskeletalNo pedal edema. No calf pain or tenderness. Fullness over the left lateral hip and gluteal area with no overt skin changes. PSYCH: alert & oriented x 3 with fluent speech NEURO:  no focal motor/sensory deficits  KPS = 70  100 - Normal; no complaints; no evidence of disease. 90   - Able to carry on normal activity; minor signs or symptoms of disease. 80   - Normal activity with effort; some signs or symptoms of disease. 46   - Cares for self; unable to carry on normal activity or to do active work. 60   - Requires occasional assistance, but is able to care for most of his personal needs. 50   - Requires considerable assistance and frequent medical care. 64   - Disabled; requires special care and assistance. 81   - Severely disabled; hospital admission is indicated although death not imminent. 64   - Very sick; hospital admission necessary; active supportive treatment necessary. 10   - Moribund; fatal processes progressing rapidly. 0     - Dead  Karnofsky DA, Abelmann WH, Craver LS and Burchenal [facility] [phone]) The use of the nitrogen mustards in the palliative treatment of carcinoma: with  particular reference to bronchogenic carcinoma Cancer 1 634-56  LABORATORY DATA:  Lab Results  Component Value Date   WBC 10.0 06/24/2015   HGB 13.2 06/24/2015   HCT 39.1 06/24/2015   MCV 87.3 06/24/2015   PLT 208 06/24/2015   Lab Results  Component Value Date   NA 136 06/24/2015   K 3.4* 06/24/2015   CL 102 06/11/2015   CO2 31* 06/24/2015   Lab Results  Component Value Date   ALT 7 06/24/2015   AST 13 06/24/2015   ALKPHOS 102 06/24/2015   BILITOT 0.45 06/24/2015     RADIOGRAPHY: Nm Pet Image Initial (pi) Skull Base To Thigh  07/01/2015   CLINICAL DATA:  Initial treatment strategy for metastatic cancer of unknown primary.  EXAM: NUCLEAR MEDICINE PET SKULL BASE TO THIGH  TECHNIQUE: 6.5 mCi F-18 FDG was injected intravenously. Full-ring PET imaging was performed from the skull base to thigh after the radiotracer. CT data was obtained and used for attenuation correction and anatomic localization.  FASTING BLOOD GLUCOSE:  Value: 93 . Mg/dl  COMPARISON:  Abdomen and pelvis CT  06/12/2019  FINDINGS: NECK  Right cervical lymph node is not enlarged, but demonstrates hypermetabolic uptake with SUV max = 3.0. No other hypermetabolic lymphadenopathy in the neck.  CHEST  Hypermetabolic mediastinal lymphadenopathy is evident. 12 mm short axis prevascular lymph node demonstrates SUV max = 6.0. 16 mm short axis precarinal lymph node (image 70 series 4) is hypermetabolic with SUV max = 9.9.  A small hypermetabolic lymph node is seen in the inferior left hilum, along the left inferior pulmonary vein (see image 84 series 4) with SUV max = 3.2.  Hypermetabolic nodules are seen in the left lower lobe. There is an area of focal architectural distortion in the left lower lobe with an irregular 6 x 16 mm spiculated nodule demonstrating SUV max = 2.5.  ABDOMEN/PELVIS  No abnormal hypermetabolic activity within the liver, pancreas, adrenal glands, or spleen.  There is some scattered FDG accumulation in the mid transverse colon, but no underlying mass lesion is evident at this location to suggest neoplasm. Left common iliac lymph node seen on image 148 of series 4 measures 9 mm in short axis and demonstrates SUV max = 3.4. Bilateral nonenlarged inguinal lymph nodes show low level FDG uptake. No other hypermetabolic lymphadenopathy is seen in the abdomen or pelvis.  SKELETON  The large necrotic left iliac lesion is markedly hypermetabolic with SUV max = 63.8.  IMPRESSION: 1. Hypermetabolic lymph nodes in the neck, chest, and anatomic pelvis. Imaging features are consistent with metastatic disease. 2. 6 x 16 mm spiculated left lower lobe pulmonary nodule image area of architectural distortion. CT imaging features of this nodule would be compatible with a primary bronchogenic neoplasm and correlation with recent left iliac biopsy results may prove helpful. There is associated hypermetabolic small left lower lobe nodule adjacent and hypermetabolic lymph node in the inferior left hilum. As mentioned above,  markedly hypermetabolic mediastinal lymphadenopathy is present. 3. The large, necrotic left iliac bone lesion is markedly hypermetabolic and no other hypermetabolic bone lesions are evident.   Electronically Signed   By: Misty Stanley M.D.   On: 07/01/2015 09:55   Ct Biopsy  06/30/2015   INDICATION: Unknown primary, now with expansile mass involving the left ilium. Please perform CT-guided biopsy for tissue diagnostic purposes  EXAM: CT-GUIDED BIOPSY OF INDETERMINATE EXPANSILE MASS INVOLVING THE LEFT ILIUM  COMPARISON:  CT the abdomen pelvis - 06/22/2015  MEDICATIONS: Fentanyl  25 mcg IV; Versed 0.5 mg IV  ANESTHESIA/SEDATION: Sedation time  8 minutes  CONTRAST:  None  COMPLICATIONS: None immediate  PROCEDURE: Informed consent was obtained from the patient following an explanation of the procedure, risks, benefits and alternatives. A time out was performed prior to the initiation of the procedure.  The patient was positioned supine on the CT table and a limited CT was performed for procedural planning demonstrating a tree in size and appearance of the expansile lytic lesion involving in the left ilium with dominant component measuring at least 10.6 x 5.2 cm (image 5, series 2). The procedure was planned. The operative site was prepped and draped in the usual sterile fashion. Appropriate trajectory was confirmed with a 22 gauge spinal needle after the adjacent tissues were anesthetized with 1% Lidocaine with epinephrine.  Under intermittent CT guidance, a 17 gauge coaxial needle was advanced into the peripheral aspect of the mass.  Appropriate positioning was confirmed and 5 core needle biopsy samples were obtained with an 18 gauge core needle biopsy device. The co-axial needle was removed and hemostasis was achieved with manual compression.  A limited postprocedural CT was negative for hemorrhage or additional complication. A dressing was placed. The patient tolerated the procedure well without immediate  postprocedural complication.  IMPRESSION: Technically successful CT guided core needle biopsy of indeterminate expansile lytic lesion involving the left ilium.   Electronically Signed   By: Sandi Mariscal M.D.   On: 06/30/2015 16:11      IMPRESSION: The patient is a 74 y.o. female who is diagnosed with metastatic poorly differentiated carcinoma potentially of lung origin. At this time, the patient would be a good candidate for palliative radiation treatment to the left iliac mass.   PLAN:  Today, I talked to the patient and family about the findings and work-up thus far.  We discussed the natural history of painful skeletal metastases and general treatment, highlighting the role of palliative radiotherapy in the management.  We discussed the available radiation techniques, and focused on the details of logistics and delivery.  We reviewed the anticipated acute and late sequelae associated with radiation in this setting.  The patient was encouraged to ask questions that I answered to the best of my ability.   We discussed the protocol for radiation treatment.  All of the patient's questions were answered. The patient does wish to proceed with this treatment. A simulation will be scheduled such that we can proceed with treatment planning.  Simulation planned for tomorrow at 1 pm. Start treatment Wednesday. Anticipate 10 treatments total to finish on August 23rd.   I spent 40 minutes minutes face to face with the patient and more than 50% of that time was spent in counseling and/or coordination of care.   ------------------------------------------------  Sheral Apley. Tammi Klippel, M.D.   This document serves as a record of services personally performed by Tyler Pita, MD. It was created on his behalf by Derek Mound, a trained medical scribe. The creation of this record is based on the scribe's personal observations and the provider's statements to them. This document has been checked and approved by the  attending provider.

## 2015-07-13 NOTE — Progress Notes (Addendum)
Denies urinary or bowel incontinence. Reports constipation related to effects of pain medication. Reports weakness of left left. Denies numbness of extremities. Riding in wheelchair today but, uses a walker at home. Takes decadron 2 mg daily more to stimulate appetite. Denies pain while sitting but, pain 8 on a scale of 0-10 when ambulating. Reports taking Oxycontin 10 mg every 12 hours and oxy IR prn.

## 2015-07-14 ENCOUNTER — Ambulatory Visit
Admission: RE | Admit: 2015-07-14 | Discharge: 2015-07-14 | Disposition: A | Discharge status: Home / Self Care | Payer: Medicare Other | Source: Ambulatory Visit | Attending: Radiation Oncology | Admitting: Radiation Oncology

## 2015-07-14 DIAGNOSIS — Z510 Encounter for antineoplastic radiation therapy: Secondary | ICD-10-CM | POA: Diagnosis not present

## 2015-07-14 DIAGNOSIS — C7951 Secondary malignant neoplasm of bone: Secondary | ICD-10-CM

## 2015-07-14 NOTE — Progress Notes (Signed)
  Radiation Oncology         [phone] ________________________________  Name: Cindy Byrd MRN: [id number]  Date: 07/14/2015  DOB: 1941/02/01  SIMULATION AND TREATMENT PLANNING NOTE    ICD-9-CM ICD-10-CM   1. Bone metastasis 198.5 C79.51     DIAGNOSIS:  74 yo woman with painful left lliac wing bone metastasis  NARRATIVE:  The patient was brought to the [facility].  Identity was confirmed.  All relevant records and images related to the planned course of therapy were reviewed.  The patient freely provided informed written consent to proceed with treatment after reviewing the details related to the planned course of therapy. The consent form was witnessed and verified by the simulation staff.  Then, the patient was set-up in a stable reproducible  supine position for radiation therapy.  CT images were obtained.  Surface markings were placed.  The CT images were loaded into the planning software.  Then the target and avoidance structures were contoured.  Treatment planning then occurred.  The radiation prescription was entered and confirmed.  Then, I designed and supervised the construction of a total of 5 medically necessary complex treatment devices including 4 MLCs to shield bowel and bladder with one bodyFix custom pillow.  I have requested : 3D Simulation  I have requested a DVH of the following structures: left hip, bladder, small bowel, and target.  I have ordered:Nutrition Consult  PLAN:  The patient will receive 30 Gy in 10 fractions.  ________________________________  Sheral Apley Tammi Klippel, M.D.

## 2015-07-15 ENCOUNTER — Ambulatory Visit
Admission: RE | Admit: 2015-07-15 | Discharge: 2015-07-15 | Disposition: A | Discharge status: Home / Self Care | Payer: Medicare Other | Source: Ambulatory Visit | Attending: Radiation Oncology | Admitting: Radiation Oncology

## 2015-07-15 DIAGNOSIS — Z510 Encounter for antineoplastic radiation therapy: Secondary | ICD-10-CM | POA: Diagnosis not present

## 2015-07-16 ENCOUNTER — Ambulatory Visit
Admission: RE | Admit: 2015-07-16 | Discharge: 2015-07-16 | Disposition: A | Discharge status: Home / Self Care | Payer: Medicare Other | Source: Ambulatory Visit | Attending: Radiation Oncology | Admitting: Radiation Oncology

## 2015-07-16 DIAGNOSIS — Z510 Encounter for antineoplastic radiation therapy: Secondary | ICD-10-CM | POA: Diagnosis not present

## 2015-07-17 ENCOUNTER — Ambulatory Visit
Admission: RE | Admit: 2015-07-17 | Discharge: 2015-07-17 | Disposition: A | Discharge status: Home / Self Care | Payer: Medicare Other | Source: Ambulatory Visit | Attending: Radiation Oncology | Admitting: Radiation Oncology

## 2015-07-17 ENCOUNTER — Other Ambulatory Visit: Payer: Self-pay | Admitting: Hematology

## 2015-07-17 ENCOUNTER — Encounter: Payer: Self-pay | Admitting: Gastroenterology

## 2015-07-17 ENCOUNTER — Encounter: Payer: Self-pay | Admitting: *Deleted

## 2015-07-17 DIAGNOSIS — C3490 Malignant neoplasm of unspecified part of unspecified bronchus or lung: Secondary | ICD-10-CM

## 2015-07-17 DIAGNOSIS — Z510 Encounter for antineoplastic radiation therapy: Secondary | ICD-10-CM | POA: Diagnosis not present

## 2015-07-17 NOTE — Progress Notes (Signed)
[facility] Psychosocial Distress Screening Clinical Social Work  Clinical Social Work was referred by distress screening protocol.  The patient scored a 10 on the Psychosocial Distress Thermometer which indicates severe distress. Clinical Social Worker phoned pt to assess for distress and other psychosocial needs. CSW phoned pt and introduced self and explain role of CSW and Pt and Family Support Team. Pt feels much better since her visit and feels it "was a miracle drug". This appears to be her steroid. She feels like she is in a better place since her first appointments and is trying to move forward. Pt reports to have good support network and agrees to reach out as needed.   ONCBCN DISTRESS SCREENING 07/06/2015  Screening Type Initial Screening  Distress experienced in past week (1-10) 10  Emotional problem type Depression;Nervousness/Anxiety;Adjusting to illness;Isolation/feeling alone;Feeling hopeless  Spiritual/Religous concerns type Facing my mortality;Loss of sense of purpose  Physical Problem type Pain;Sleep/insomnia;Getting around;Bathing/dressing;Loss of appetitie  Physician notified of physical symptoms Yes  Referral to clinical psychology No  Referral to clinical social work Yes  Referral to dietition No  Referral to financial advocate No  Referral to support programs No  Referral to palliative care No    Clinical Social Worker follow up needed: No.  If yes, follow up plan:  Loren Racer, [place]  [facility] Phone: [phone] Fax: [phone]

## 2015-07-20 ENCOUNTER — Encounter (HOSPITAL_COMMUNITY): Payer: Self-pay

## 2015-07-20 ENCOUNTER — Encounter: Payer: Self-pay | Admitting: Radiation Oncology

## 2015-07-20 ENCOUNTER — Ambulatory Visit
Admission: RE | Admit: 2015-07-20 | Discharge: 2015-07-20 | Disposition: A | Discharge status: Home / Self Care | Payer: Medicare Other | Source: Ambulatory Visit | Attending: Radiation Oncology | Admitting: Radiation Oncology

## 2015-07-20 ENCOUNTER — Ambulatory Visit (HOSPITAL_COMMUNITY)
Admission: RE | Admit: 2015-07-20 | Discharge: 2015-07-20 | Disposition: A | Discharge status: Home / Self Care | Payer: Medicare Other | Source: Ambulatory Visit | Attending: Hematology | Admitting: Hematology

## 2015-07-20 ENCOUNTER — Other Ambulatory Visit: Payer: Self-pay | Admitting: *Deleted

## 2015-07-20 VITALS — BP 123/48 | HR 53 | Resp 16 | Wt 138.1 lb

## 2015-07-20 DIAGNOSIS — Z510 Encounter for antineoplastic radiation therapy: Secondary | ICD-10-CM | POA: Diagnosis not present

## 2015-07-20 DIAGNOSIS — C7951 Secondary malignant neoplasm of bone: Secondary | ICD-10-CM

## 2015-07-20 DIAGNOSIS — C801 Malignant (primary) neoplasm, unspecified: Secondary | ICD-10-CM | POA: Diagnosis not present

## 2015-07-20 MED ORDER — IOHEXOL 300 MG/ML  SOLN
100.0000 mL | Freq: Once | INTRAMUSCULAR | Status: AC | PRN
  Administered 2015-07-20: 100 mL via INTRAVENOUS

## 2015-07-20 NOTE — Progress Notes (Signed)
Weight and vitals stable. Reports pain is much less since starting radiation therapy. Reports since being prescribed long acting pain medication she is having to take less prn pain medication. Reports appetite has improved with the aid of decadron.  Edema noted in both lower extremities. Encouraged patient to elevate feet above the level of her heart. Understand edema of feet could be related to decadron.  BP 123/48 mmHg  Pulse 53  Resp 16  Wt 138 lb 1.6 oz (62.642 kg)  SpO2 100% Wt Readings from Last 3 Encounters:  07/20/15 138 lb 1.6 oz (62.642 kg)  07/13/15 137 lb 1.6 oz (62.188 kg)  07/06/15 135 lb 4.8 oz (61.372 kg)

## 2015-07-21 ENCOUNTER — Ambulatory Visit
Admission: RE | Admit: 2015-07-21 | Discharge: 2015-07-21 | Disposition: A | Discharge status: Home / Self Care | Payer: Medicare Other | Source: Ambulatory Visit | Attending: Radiation Oncology | Admitting: Radiation Oncology

## 2015-07-21 ENCOUNTER — Ambulatory Visit (HOSPITAL_BASED_OUTPATIENT_CLINIC_OR_DEPARTMENT_OTHER): Payer: Medicare Other | Admitting: Hematology

## 2015-07-21 ENCOUNTER — Encounter: Payer: Self-pay | Admitting: Hematology

## 2015-07-21 ENCOUNTER — Other Ambulatory Visit (HOSPITAL_BASED_OUTPATIENT_CLINIC_OR_DEPARTMENT_OTHER): Payer: Medicare Other

## 2015-07-21 VITALS — BP 129/63 | HR 60 | Temp 98.4°F | Resp 18 | Ht 68.0 in | Wt 137.6 lb

## 2015-07-21 DIAGNOSIS — C801 Malignant (primary) neoplasm, unspecified: Secondary | ICD-10-CM | POA: Diagnosis not present

## 2015-07-21 DIAGNOSIS — F329 Major depressive disorder, single episode, unspecified: Secondary | ICD-10-CM

## 2015-07-21 DIAGNOSIS — F419 Anxiety disorder, unspecified: Secondary | ICD-10-CM

## 2015-07-21 DIAGNOSIS — C799 Secondary malignant neoplasm of unspecified site: Secondary | ICD-10-CM

## 2015-07-21 DIAGNOSIS — C3490 Malignant neoplasm of unspecified part of unspecified bronchus or lung: Secondary | ICD-10-CM

## 2015-07-21 DIAGNOSIS — Z510 Encounter for antineoplastic radiation therapy: Secondary | ICD-10-CM | POA: Diagnosis not present

## 2015-07-21 LAB — COMPREHENSIVE METABOLIC PANEL (CC13)
ALT: 8 U/L (ref 0–55)
AST: 10 U/L (ref 5–34)
Albumin: 2.9 g/dL — ABNORMAL LOW (ref 3.5–5.0)
Alkaline Phosphatase: 84 U/L (ref 40–150)
Anion Gap: 8 mEq/L (ref 3–11)
BUN: 14.1 mg/dL (ref 7.0–26.0)
CHLORIDE: 106 meq/L (ref 98–109)
CO2: 28 meq/L (ref 22–29)
Calcium: 8.9 mg/dL (ref 8.4–10.4)
Creatinine: 0.8 mg/dL (ref 0.6–1.1)
EGFR: 71 mL/min/{1.73_m2} — AB (ref >90)
GLUCOSE: 98 mg/dL (ref 70–140)
POTASSIUM: 3.9 meq/L (ref 3.5–5.1)
SODIUM: 143 meq/L (ref 136–145)
Total Bilirubin: 0.46 mg/dL (ref 0.20–1.20)
Total Protein: 6.4 g/dL (ref 6.4–8.3)

## 2015-07-21 LAB — CBC WITH DIFFERENTIAL/PLATELET
BASO%: 0.6 % (ref 0.0–2.0)
BASOS ABS: 0 10*3/uL (ref 0.0–0.1)
EOS ABS: 0.2 10*3/uL (ref 0.0–0.5)
EOS%: 3.4 % (ref 0.0–7.0)
HCT: 39.6 % (ref 34.8–46.6)
HGB: 12.7 g/dL (ref 11.6–15.9)
LYMPH%: 12 % — AB (ref 14.0–49.7)
MCH: 28.4 pg (ref 25.1–34.0)
MCHC: 32.1 g/dL (ref 31.5–36.0)
MCV: 88.6 fL (ref 79.5–101.0)
MONO#: 0.5 10*3/uL (ref 0.1–0.9)
MONO%: 6.6 % (ref 0.0–14.0)
NEUT#: 5.4 10*3/uL (ref 1.5–6.5)
NEUT%: 77.4 % — ABNORMAL HIGH (ref 38.4–76.8)
Platelets: 169 10*3/uL (ref 145–400)
RBC: 4.47 10*6/uL (ref 3.70–5.45)
RDW: 14.1 % (ref 11.2–14.5)
WBC: 7 10*3/uL (ref 3.9–10.3)
lymph#: 0.8 10*3/uL — ABNORMAL LOW (ref 0.9–3.3)

## 2015-07-21 MED ORDER — CITALOPRAM HYDROBROMIDE 10 MG PO TABS: 10.0000 mg | ORAL_TABLET | Freq: Every day | ORAL | Status: DC

## 2015-07-22 ENCOUNTER — Ambulatory Visit
Admission: RE | Admit: 2015-07-22 | Discharge: 2015-07-22 | Disposition: A | Discharge status: Home / Self Care | Payer: Medicare Other | Source: Ambulatory Visit | Attending: Radiation Oncology | Admitting: Radiation Oncology

## 2015-07-22 DIAGNOSIS — Z510 Encounter for antineoplastic radiation therapy: Secondary | ICD-10-CM | POA: Diagnosis not present

## 2015-07-22 NOTE — Progress Notes (Signed)
[name]    HEMATOLOGY ONCOLOGY PROGRESS NOTE  Date of service: 07/21/2015  Patient Care Team: Tamsen Roers, MD as PCP - General (Family Medicine) [name], MD as Consulting Physician (Hematology and Oncology)  CHIEF COMPLAINTS/PURPOSE OF CONSULTATION: Follow-up for metastatic malignancy with likely lung primary.  HISTORY OF PRESENTING ILLNESS: (plz see my previous consultation for details of initial presentation)  INTERVAL HISTORY  Ms Jorden he is here with her husband for follow-up. She has completed 8 out of planned 10 radiation fractions to her left ilium. She notes that with the current pain regimen and her radiation therapy her pain is under very good control. No nausea. Has been sleeping well. Has gained 2-3 pounds since her last visit. Did not like the Remeron and noted some nightmares after the first 2 doses and therefore stopped taking it. Still smoking cigarettes. He was offered palliative care services at home but wants to hold off at this time. EGFR mutation testing on blood was sent out today. Has EGD scheduled with her gastroenterologist towards end of this month. Does note some depression and anxiety. Is willing to try Celexa instead of the Remeron to help with anxiety and depression. Given her weight loss her blood pressure has been running somewhat low and therefore her amlodipine and hydrochlorothiazide was discontinued.  MEDICAL HISTORY:  Past Medical History  Diagnosis Date  . IBS (irritable bowel syndrome)   . Bone neoplasm 06/24/2015  . Hypertension 06/24/2015    likely improved incidental to 40 lbs weight loss from her neoplasm  . GERD (gastroesophageal reflux disease)   . Vitamin D deficiency disease   . Cigarette smoker two packs a day or less     Currently still smoking 2 PPD - Not interested in quitting at this time.  . Colon polyps     hyperplastic, tubular adenomas, tubulovillous adenoma  . Endometriosis     Hysterectomy with BSO at age 48 yrs  .  H/O: pneumonia   . Heavy smoker (more than 20 cigarettes per day) 06/24/2015  . Depression 06/24/2015  . Cancer     metastatic poorly differentiated carcinoma   SURGICAL HISTORY: Past Surgical History  Procedure Laterality Date  . Abdominal hysterectomy    . Tonsillectomy    . Ganglion cyst excision    . Total abdominal hysterectomy w/ bilateral salpingoophorectomy  at age 67 yrs    For endometriosis  . Knee arthroscopy  age about 42 yrs  . Bartholin gland cyst excision  74 yo ago    Does not want if it was an infected cyst or tumor. Was soon as delivery  . Colonoscopy w/ polypectomy      multiple times - last done 09/2014 per patient.    SOCIAL HISTORY: Social History   Social History  . Marital Status: Widowed    Spouse Name: N/A  . Number of Children: N/A  . Years of Education: N/A   Occupational History  . Not on file.   Social History Main Topics  . Smoking status: Current Every Day Smoker -- 2.00 packs/day for 60 years    Types: Cigarettes  . Smokeless tobacco: Never Used  . Alcohol Use: No  . Drug Use: No  . Sexual Activity: No   Other Topics Concern  . Not on file   Social History Narrative    FAMILY HISTORY: Family History  Problem Relation Age of Onset  . Stroke Mother   . Colon cancer Father   . Colon cancer Brother   .  Colon cancer Brother   . Breast cancer Daughter 36    ER/PR+ stage II    ALLERGIES:  is allergic to penicillins. patient wonders if she has a penicillin allergy but notes that she is uncertain about this.  MEDICATIONS:  Current Outpatient Prescriptions  Medication Sig Dispense Refill  . bisoprolol (ZEBETA) 5 MG tablet Take 5 mg by mouth daily.    [name] dexamethasone (DECADRON) 2 MG tablet Take 1 tablet (2 mg total) by mouth daily. 30 tablet 0  . omeprazole (PRILOSEC OTC) 20 MG tablet Take 20 mg by mouth daily.    . ondansetron (ZOFRAN) 4 MG tablet Take 1 tablet (4 mg total) by mouth every 8 (eight) hours as needed for nausea or  vomiting. 30 tablet 0  . oxyCODONE (OXY IR/ROXICODONE) 5 MG immediate release tablet Take 1 tablet (5 mg total) by mouth every 4 (four) hours as needed for severe pain. 60 tablet 0  . OxyCODONE (OXYCONTIN) 10 mg T12A 12 hr tablet Take 1 tablet (10 mg total) by mouth every 12 (twelve) hours. 60 tablet 0  . senna-docusate (SENOKOT-S) 8.6-50 MG per tablet Take 2 tablets by mouth at bedtime as needed for mild constipation. 30 tablet 1  . citalopram (CELEXA) 10 MG tablet Take 1 tablet (10 mg total) by mouth daily. 30 tablet 1   No current facility-administered medications for this visit.    REVIEW OF SYSTEMS:   Constitutional: Denies fevers, chills or abnormal night sweats Eyes: Denies blurriness of vision, double vision or watery eyes Ears, nose, mouth, throat, and face: Denies mucositis or sore throat Respiratory: Denies cough, dyspnea or wheezes Cardiovascular: Denies palpitation, chest discomfort or lower extremity swelling Gastrointestinal:  Denies nausea, heartburn or change in bowel habits Skin: Denies abnormal skin rashes Lymphatics: Denies new lymphadenopathy or easy bruising Neurological:Denies numbness, tingling or new weaknesses Behavioral/Psych: Mildly depressed affect ,Mood is stable no suicidal or homicidal ideation   All other systems were reviewed with the patient and are negative.   PHYSICAL EXAMINATION: ECOG PERFORMANCE STATUS: 2 - Symptomatic, <50% confined to bed  Filed Vitals:   07/21/15 1006  BP: 129/63  Pulse: 60  Temp: 98.4 F (36.9 C)  Resp: 18   Filed Weights   07/21/15 1006  Weight: 137 lb 9.6 oz (62.415 kg)   GENERAL:alert, patient appears much more comfortable and relax compared to her last visit. SKIN: skin color, texture, turgor are normal, no rashes or significant lesions EYES: normal, conjunctiva are pink and non-injected, sclera clear OROPHARYNX:no exudate, no erythema and lips, buccal mucosa, and tongue normal  NECK: supple, thyroid normal  size, non-tender, without nodularity LYMPH:  no palpable lymphadenopathy in the cervical, axillary or inguinal LUNGS: clear to auscultation and bilateral reduced breath sounds with normal respiratory effort  HEART: regular rate & rhythm and no murmurs and no lower extremity edema ABDOMEN:abdomen soft, non-tender and normal bowel sounds Musculoskeletal: No pedal edema. No calf pain or tenderness. Fullness over the left lateral hip and gluteal area with no overt skin changes. PSYCH: alert & oriented x 3 with fluent speech NEURO: no focal motor/sensory deficits  LABORATORY DATA:  I have reviewed the data as listed Lab Results  Component Value Date   WBC 7.0 07/21/2015   HGB 12.7 07/21/2015   HCT 39.6 07/21/2015   MCV 88.6 07/21/2015   PLT 169 07/21/2015    Recent Labs  06/11/15 2252 06/24/15 1325 07/21/15 0937  NA 138 136 143  K 3.4* 3.4* 3.9  CL 102  --   --  CO2 29 31* 28  GLUCOSE 103* 122 98  BUN 19 20.4 14.1  CREATININE 0.87 1.0 0.8  CALCIUM 9.6 10.2 8.9  GFRNONAA >60  --   --   GFRAA >60  --   --   PROT  --  7.2 6.4  ALBUMIN  --  2.9* 2.9*  AST  --  13 10  ALT  --  7 8  ALKPHOS  --  102 84  BILITOT  --  0.45 0.46    RADIOGRAPHIC STUDIES: I have personally reviewed the radiological images as listed and agreed with the findings in the report. Ct Head W Wo Contrast  07/20/2015   CLINICAL DATA:  Metastatic carcinoma of unknown primary.  Staging  EXAM: CT HEAD WITHOUT AND WITH CONTRAST  TECHNIQUE: Contiguous axial images were obtained from the base of the skull through the vertex without and with intravenous contrast  CONTRAST:  143m OMNIPAQUE IOHEXOL 300 MG/ML  SOLN  COMPARISON:  None.  FINDINGS: The brain has a normal appearance without evidence of atrophy, infarction, mass lesion, hemorrhage, hydrocephalus or extra-axial collection. The calvarium is unremarkable. The paranasal sinuses, middle ears and mastoids are clear. After contrast administration, no abnormal  enhancement occurs.  IMPRESSION: Normal head CT.  No evidence of metastatic disease.   Electronically Signed   By: MNelson ChimesM.D.   On: 07/20/2015 15:21   Nm Pet Image Initial (pi) Skull Base To Thigh  07/01/2015   CLINICAL DATA:  Initial treatment strategy for metastatic cancer of unknown primary.  EXAM: NUCLEAR MEDICINE PET SKULL BASE TO THIGH  TECHNIQUE: 6.5 mCi F-18 FDG was injected intravenously. Full-ring PET imaging was performed from the skull base to thigh after the radiotracer. CT data was obtained and used for attenuation correction and anatomic localization.  FASTING BLOOD GLUCOSE:  Value: 93 . Mg/dl  COMPARISON:  Abdomen and pelvis CT 06/12/2019  FINDINGS: NECK  Right cervical lymph node is not enlarged, but demonstrates hypermetabolic uptake with SUV max = 3.0. No other hypermetabolic lymphadenopathy in the neck.  CHEST  Hypermetabolic mediastinal lymphadenopathy is evident. 12 mm short axis prevascular lymph node demonstrates SUV max = 6.0. 16 mm short axis precarinal lymph node (image 70 series 4) is hypermetabolic with SUV max = 9.9.  A small hypermetabolic lymph node is seen in the inferior left hilum, along the left inferior pulmonary vein (see image 84 series 4) with SUV max = 3.2.  Hypermetabolic nodules are seen in the left lower lobe. There is an area of focal architectural distortion in the left lower lobe with an irregular 6 x 16 mm spiculated nodule demonstrating SUV max = 2.5.  ABDOMEN/PELVIS  No abnormal hypermetabolic activity within the liver, pancreas, adrenal glands, or spleen.  There is some scattered FDG accumulation in the mid transverse colon, but no underlying mass lesion is evident at this location to suggest neoplasm. Left common iliac lymph node seen on image 148 of series 4 measures 9 mm in short axis and demonstrates SUV max = 3.4. Bilateral nonenlarged inguinal lymph nodes show low level FDG uptake. No other hypermetabolic lymphadenopathy is seen in the abdomen or  pelvis.  SKELETON  The large necrotic left iliac lesion is markedly hypermetabolic with SUV max = 132.1  IMPRESSION: 1. Hypermetabolic lymph nodes in the neck, chest, and anatomic pelvis. Imaging features are consistent with metastatic disease. 2. 6 x 16 mm spiculated left lower lobe pulmonary nodule image area of architectural distortion. CT imaging features of this nodule  would be compatible with a primary bronchogenic neoplasm and correlation with recent left iliac biopsy results may prove helpful. There is associated hypermetabolic small left lower lobe nodule adjacent and hypermetabolic lymph node in the inferior left hilum. As mentioned above, markedly hypermetabolic mediastinal lymphadenopathy is present. 3. The large, necrotic left iliac bone lesion is markedly hypermetabolic and no other hypermetabolic bone lesions are evident.   Electronically Signed   By: Misty Stanley M.D.   On: 07/01/2015 09:55   Ct Biopsy  06/30/2015   INDICATION: Unknown primary, now with expansile mass involving the left ilium. Please perform CT-guided biopsy for tissue diagnostic purposes  EXAM: CT-GUIDED BIOPSY OF INDETERMINATE EXPANSILE MASS INVOLVING THE LEFT ILIUM  COMPARISON:  CT the abdomen pelvis - 06/22/2015  MEDICATIONS: Fentanyl 25 mcg IV; Versed 0.5 mg IV  ANESTHESIA/SEDATION: Sedation time  8 minutes  CONTRAST:  None  COMPLICATIONS: None immediate  PROCEDURE: Informed consent was obtained from the patient following an explanation of the procedure, risks, benefits and alternatives. A time out was performed prior to the initiation of the procedure.  The patient was positioned supine on the CT table and a limited CT was performed for procedural planning demonstrating a tree in size and appearance of the expansile lytic lesion involving in the left ilium with dominant component measuring at least 10.6 x 5.2 cm (image 5, series 2). The procedure was planned. The operative site was prepped and draped in the usual sterile  fashion. Appropriate trajectory was confirmed with a 22 gauge spinal needle after the adjacent tissues were anesthetized with 1% Lidocaine with epinephrine.  Under intermittent CT guidance, a 17 gauge coaxial needle was advanced into the peripheral aspect of the mass.  Appropriate positioning was confirmed and 5 core needle biopsy samples were obtained with an 18 gauge core needle biopsy device. The co-axial needle was removed and hemostasis was achieved with manual compression.  A limited postprocedural CT was negative for hemorrhage or additional complication. A dressing was placed. The patient tolerated the procedure well without immediate postprocedural complication.  IMPRESSION: Technically successful CT guided core needle biopsy of indeterminate expansile lytic lesion involving the left ilium.   Electronically Signed   By: Sandi Mariscal M.D.   On: 06/30/2015 16:11    ASSESSMENT & PLAN:   #1 Metastatic poorly differentiated carcinoma with likely lung primary [non-small cell lung cancer].  Less likely would possibly upper GI. CT of the head with and without contrast showed no evidence of metastatic disease. Patient notes much improved pain control. Plan -Continue current pain regimen with bowel prophylaxis -Continue low-dose dexamethasone for appetite and nausea. -Follow-up with GI for her EGD as scheduled. -EGFR mutation analysis sent out on blood since it was not possible on her decalcified tissue. - rediscussed smoking cessation. She is not keen to pursue this at this time -after completion of palliative radiation will need systemic therapy for her metastatic carcinoma. EGFR directed therapy if mutation present versus Nivolumab ( in the absence of an EGFR mutation) in about 2-3 weeks. -Will need to be started on Xgeva for SRE prophylaxis after completion of radiation therapy. In about 2 weeks. Scheduling orders placed for Xgeva and Nivolumab will allow for insurance preapprovals and financial  processing.  #2 Anxiety and depression related to her life-threatening diagnosis of cancer . Patient did not tolerate Remeron due to nightmares   plan We'll discontinue Remeron We'll start the patient on Celexa 10 mg by mouth daily and adjust dose as needed. Patient was  offered when necessary Ativan for anxiety but declined this currently.  Return to clinic in 2 weeks with Dr. Irene Limbo with cbc, cmp  All questions were answered. The patient knows to call the clinic with any problems, questions or concerns. I spent 25 minutes counseling the patient face to face. The total time spent in the appointment was 40 minutes and more than 50% was on counseling.   Sullivan Lone MD [place] [facility]ematology/Oncology Physician [facility]  (Office):       [phone] (Work cell):  [phone] (Fax):           [phone]

## 2015-07-23 ENCOUNTER — Ambulatory Visit
Admission: RE | Admit: 2015-07-23 | Discharge: 2015-07-23 | Disposition: A | Discharge status: Home / Self Care | Payer: Medicare Other | Source: Ambulatory Visit | Attending: Radiation Oncology | Admitting: Radiation Oncology

## 2015-07-23 ENCOUNTER — Telehealth: Payer: Self-pay | Admitting: *Deleted

## 2015-07-23 ENCOUNTER — Telehealth: Payer: Self-pay | Admitting: Hematology

## 2015-07-23 DIAGNOSIS — Z510 Encounter for antineoplastic radiation therapy: Secondary | ICD-10-CM | POA: Diagnosis not present

## 2015-07-23 NOTE — Telephone Encounter (Signed)
Labs/ov/inj added per 08/16 POF, sent msg to add chemo NP Opdivo, will contact pt once added... KJ

## 2015-07-23 NOTE — Telephone Encounter (Signed)
Per staff message and POF I have scheduled appts. Advised scheduler of appts. JMW  

## 2015-07-24 ENCOUNTER — Ambulatory Visit
Admission: RE | Admit: 2015-07-24 | Discharge: 2015-07-24 | Disposition: A | Discharge status: Home / Self Care | Payer: Medicare Other | Source: Ambulatory Visit | Attending: Radiation Oncology | Admitting: Radiation Oncology

## 2015-07-24 ENCOUNTER — Encounter: Payer: Self-pay | Admitting: Radiation Oncology

## 2015-07-24 VITALS — BP 108/45 | HR 41 | Resp 16 | Wt 138.9 lb

## 2015-07-24 DIAGNOSIS — C7951 Secondary malignant neoplasm of bone: Secondary | ICD-10-CM

## 2015-07-24 DIAGNOSIS — Z510 Encounter for antineoplastic radiation therapy: Secondary | ICD-10-CM | POA: Diagnosis not present

## 2015-07-24 NOTE — Progress Notes (Addendum)
Weight stable. BP and heart rate low. Patient denies feeling light headed or dizzy. Reports she recently took both her BP and pain medication. Denies pain. States, "I just don't feel well today." Patient unable to define anything in particular causing her not to feel well. Reports pain of left iliac is "alot better." Reports ambulating at home without her walker attempting to build up her strength. Denies need for in home PT. Normalizes her bowel habits. Continues Decadron 2 mg once a day. No thrush noted. One month follow up appointment card given.  BP 108/45 mmHg  Pulse 41  Resp 16  Wt 138 lb 14.4 oz (63.005 kg) Wt Readings from Last 3 Encounters:  07/24/15 138 lb 14.4 oz (63.005 kg)  07/21/15 137 lb 9.6 oz (62.415 kg)  07/20/15 138 lb 1.6 oz (62.642 kg)

## 2015-07-24 NOTE — Progress Notes (Signed)
  Radiation Oncology         [phone]     Name: Cindy Byrd MRN: [id number]   Date: 07/24/2015  DOB: 1941-08-13   Weekly Radiation Therapy Management    ICD-9-CM ICD-10-CM   1. Bone metastasis 198.5 C79.51     Current Dose: 24 Gy  Planned Dose:  30 Gy  Narrative The patient presents for routine under treatment assessment. Weight and vitals stable. Denies pain. States, "I just don't feel well today." Patient unable to define anything in particular causing her not to feel well. Reports pain of left iliac is "alot better." Reports ambulating at home without her walker attempting to build up her strength. Denies need for in home PT. Normalizes her bowel habits. Continues Decadron 2 mg once a day. No thrush noted. One month follow up appointment card given. Notes intermittent itchiness on her back after treatment. No rash noted. Patient is on blood pressure medication.  The patient is without complaint. Set-up films were reviewed. The chart was checked.  Physical Findings  weight is 138 lb 14.4 oz (63.005 kg). Her blood pressure is 108/45 and her pulse is 41. Her respiration is 16. . Weight essentially stable.  No significant changes.  Impression The patient is tolerating radiation.  Plan Continue treatment as planned. Patient will complete radiation next week. Follow up in month. I have advised the patient to hold her blood pressure medication for now and we will monitor her blood pressure closely at her next visits.    This document serves as a record of services personally performed by Tyler Pita, MD. It was created on his behalf by Arlyce Harman, a trained medical scribe. The creation of this record is based on the scribe's personal observations and the provider's statements to them. This document has been checked and approved by the attending provider.      Sheral Apley Tammi Klippel, M.D.

## 2015-07-27 ENCOUNTER — Ambulatory Visit
Admission: RE | Admit: 2015-07-27 | Discharge: 2015-07-27 | Disposition: A | Discharge status: Home / Self Care | Payer: Medicare Other | Source: Ambulatory Visit | Attending: Radiation Oncology | Admitting: Radiation Oncology

## 2015-07-27 DIAGNOSIS — Z510 Encounter for antineoplastic radiation therapy: Secondary | ICD-10-CM | POA: Diagnosis not present

## 2015-07-27 NOTE — Progress Notes (Signed)
Assessed patient's bp as directed by Dr. Tammi Klippel prior to treatment. BP and heart rate continue to be below normal limits. Patient denies feeling light headed or dizzy. States, "I just feel worn out." Patient confirms that she held her bp medication as directed by Dr. Tammi Klippel.  Instructed patient to continue to hold bp medication and contact her PCP to follow up reference low bp and heart rate. Patient verbalized understanding.

## 2015-07-28 ENCOUNTER — Ambulatory Visit: Payer: Medicare Other

## 2015-07-28 ENCOUNTER — Encounter: Payer: Self-pay | Admitting: Radiation Oncology

## 2015-07-28 ENCOUNTER — Ambulatory Visit
Admission: RE | Admit: 2015-07-28 | Discharge: 2015-07-28 | Disposition: A | Discharge status: Home / Self Care | Payer: Medicare Other | Source: Ambulatory Visit | Attending: Radiation Oncology | Admitting: Radiation Oncology

## 2015-07-28 DIAGNOSIS — Z510 Encounter for antineoplastic radiation therapy: Secondary | ICD-10-CM | POA: Diagnosis not present

## 2015-07-29 ENCOUNTER — Ambulatory Visit: Payer: Medicare Other

## 2015-07-29 ENCOUNTER — Telehealth: Payer: Self-pay | Admitting: Radiation Oncology

## 2015-07-29 NOTE — Telephone Encounter (Signed)
Phoned Dr. Eddie Dibbles office, patient's PCP. Spoke with Mortimer Fries about patient's low heart rate and bp. She requested Dr. Johny Shears office notes be faxed to [phone]. She verbalized she will provided these notes indicating the patient needs to be seen to Dr. Rex Kras and arrange a follow up appointment for the patient. Notes faxed. Confirmation of fax delivery obtained.

## 2015-07-30 ENCOUNTER — Ambulatory Visit: Payer: Medicare Other

## 2015-07-31 ENCOUNTER — Ambulatory Visit: Payer: Medicare Other

## 2015-08-03 ENCOUNTER — Ambulatory Visit: Payer: Medicare Other

## 2015-08-04 ENCOUNTER — Ambulatory Visit: Payer: Medicare Other

## 2015-08-04 ENCOUNTER — Ambulatory Visit (INDEPENDENT_AMBULATORY_CARE_PROVIDER_SITE_OTHER): Payer: Medicare Other | Admitting: Gastroenterology

## 2015-08-04 ENCOUNTER — Encounter: Payer: Self-pay | Admitting: Gastroenterology

## 2015-08-04 VITALS — BP 150/60 | HR 60 | Ht 68.0 in | Wt 138.8 lb

## 2015-08-04 DIAGNOSIS — K219 Gastro-esophageal reflux disease without esophagitis: Secondary | ICD-10-CM | POA: Diagnosis not present

## 2015-08-04 DIAGNOSIS — C801 Malignant (primary) neoplasm, unspecified: Secondary | ICD-10-CM | POA: Diagnosis not present

## 2015-08-04 DIAGNOSIS — C799 Secondary malignant neoplasm of unspecified site: Secondary | ICD-10-CM

## 2015-08-04 NOTE — Progress Notes (Addendum)
08/04/2015 Cindy Byrd [id number] February 08, 1941   HISTORY OF PRESENT ILLNESS:  This is a 74 year old female who has been diagnosed with metastatic cancer of uncertain primary.  After imaging and biopsy, it is highly suspicious for primary lung (small cell) cancer.  Apparently she elicited some epigastric discomfort to the oncologist, Dr. Irene Limbo, who referred her here for EGD.  Wants to rule out UGI source of malignancy.  She denies any GI complaints to me.  She does have chronic GERD, but it is well controlled on omeprazole 20 mg daily.  Has never undergone EGD.  Admits to 35 pound weight loss recently.  Has large necrotic lesion in left iliac bone that has been radiated due to pain and inability to walk; has had some improvement since completing radiation treatments.  Other treatment will depend on definitive cancer diagnosis.  Has been followed by Dr. Earlean Shawl for colonoscopies with the last one in 08/2013.  Has history of several adenomatous polyps but colonoscopy 08/2013 only showed two polyps (one tubular adenoma and one hyperplastic polyp) and diverticulosis.  Colonoscopy records from 2001, 2004, 2008, and 2014 are going to be scanned into EPIC.   Past Medical History  Diagnosis Date  . IBS (irritable bowel syndrome)   . Bone neoplasm 06/24/2015  . Hypertension 06/24/2015    likely improved incidental to 40 lbs weight loss from her neoplasm  . GERD (gastroesophageal reflux disease)   . Vitamin D deficiency disease   . Cigarette smoker two packs a day or less     Currently still smoking 2 PPD - Not interested in quitting at this time.  . Colon polyps     hyperplastic, tubular adenomas, tubulovillous adenoma  . Endometriosis     Hysterectomy with BSO at age 12 yrs  . H/O: pneumonia   . Heavy smoker (more than 20 cigarettes per day) 06/24/2015  . Depression 06/24/2015  . Cancer     metastatic poorly differentiated carcinoma   Past Surgical History  Procedure Laterality Date  .  Abdominal hysterectomy    . Tonsillectomy    . Ganglion cyst excision    . Total abdominal hysterectomy w/ bilateral salpingoophorectomy  at age 78 yrs    For endometriosis  . Knee arthroscopy  age about 2 yrs  . Bartholin gland cyst excision  73 yo ago    Does not want if it was an infected cyst or tumor. Was soon as delivery  . Colonoscopy w/ polypectomy      multiple times - last done 09/2014 per patient.    reports that she has been smoking Cigarettes.  She has a 120 pack-year smoking history. She has never used smokeless tobacco. She reports that she does not drink alcohol or use illicit drugs. family history includes Breast cancer (age of onset: 28) in her daughter; Colon cancer in her brother, brother, and father; Stroke in her mother. Allergies  Allergen Reactions  . Penicillins Other (See Comments)    Unknown; childhood allergy      Outpatient Encounter Prescriptions as of 08/04/2015  Medication Sig  . citalopram (CELEXA) 10 MG tablet Take 1 tablet (10 mg total) by mouth daily.  [name] docusate sodium (COLACE) 100 MG capsule Take 100 mg by mouth daily as needed for mild constipation.  [name] omeprazole (PRILOSEC OTC) 20 MG tablet Take 20 mg by mouth daily.  . OxyCODONE (OXYCONTIN) 10 mg T12A 12 hr tablet Take 1 tablet (10 mg total) by mouth every 12 (  twelve) hours.  . [DISCONTINUED] amLODipine (NORVASC) 5 MG tablet   . [DISCONTINUED] bisoprolol (ZEBETA) 5 MG tablet Take 5 mg by mouth daily.  . [DISCONTINUED] dexamethasone (DECADRON) 2 MG tablet Take 1 tablet (2 mg total) by mouth daily.  . [DISCONTINUED] hydrochlorothiazide (MICROZIDE) 12.5 MG capsule   . [DISCONTINUED] mirtazapine (REMERON) 15 MG tablet   . [DISCONTINUED] ondansetron (ZOFRAN) 4 MG tablet Take 1 tablet (4 mg total) by mouth every 8 (eight) hours as needed for nausea or vomiting. (Patient not taking: Reported on 07/24/2015)  . [DISCONTINUED] oxyCODONE (OXY IR/ROXICODONE) 5 MG immediate release tablet Take 1 tablet (5 mg  total) by mouth every 4 (four) hours as needed for severe pain.  . [DISCONTINUED] senna-docusate (SENOKOT-S) 8.6-50 MG per tablet Take 2 tablets by mouth at bedtime as needed for mild constipation.   No facility-administered encounter medications on file as of 08/04/2015.     REVIEW OF SYSTEMS  : All other systems reviewed and negative except where noted in the History of Present Illness.   PHYSICAL EXAM: BP 150/60 mmHg  Pulse 60  Ht '5\' 8"'$  (1.727 m)  Wt 138 lb 12.8 oz (62.959 kg)  BMI 21.11 kg/m2 General: Well developed white female in no acute distress Head: Normocephalic and atraumatic Eyes:  Sclerae anicteric, conjunctiva pink. Ears: Normal auditory acuity Lungs: Wheezing noted B/L Heart: Regular rate and rhythm Abdomen: Soft, non-distended.  BS present.  Non-tender. Musculoskeletal: Symmetrical with no gross deformities  Skin: No lesions on visible extremities Extremities: No edema  Neurological: Alert oriented x 4, grossly non-focal Psychological:  Alert and cooperative. Normal mood and affect  ASSESSMENT AND PLAN: -Metastatic cancer of uncertain primary:  Highly suspicious for small cell lung cancer, but oncology requesting EGD to rule out UGI source.   -Chronic GERD on omeprazole 20 mg daily with good control of symptoms.  Never had EGD. -Personal history of multiple colon polyps:  Followed regularly by Dr. Earlean Shawl with last colonoscopy 08/2013.   CC:  [name], MD   Addendum: Reviewed and agree with initial management. [name], MD

## 2015-08-04 NOTE — Patient Instructions (Signed)
You have been scheduled for your Endoscopy at [facility] Separate instructions have been given

## 2015-08-05 ENCOUNTER — Ambulatory Visit: Payer: Medicare Other

## 2015-08-06 ENCOUNTER — Ambulatory Visit (HOSPITAL_BASED_OUTPATIENT_CLINIC_OR_DEPARTMENT_OTHER): Payer: Medicare Other | Admitting: Hematology

## 2015-08-06 ENCOUNTER — Other Ambulatory Visit: Payer: Self-pay | Admitting: *Deleted

## 2015-08-06 ENCOUNTER — Telehealth: Payer: Self-pay | Admitting: Hematology

## 2015-08-06 ENCOUNTER — Ambulatory Visit: Payer: Medicare Other

## 2015-08-06 ENCOUNTER — Encounter (HOSPITAL_COMMUNITY): Payer: Self-pay | Admitting: *Deleted

## 2015-08-06 ENCOUNTER — Other Ambulatory Visit (HOSPITAL_BASED_OUTPATIENT_CLINIC_OR_DEPARTMENT_OTHER): Payer: Medicare Other

## 2015-08-06 ENCOUNTER — Encounter: Payer: Self-pay | Admitting: Hematology

## 2015-08-06 VITALS — BP 153/65 | HR 58 | Temp 98.2°F | Resp 18 | Ht 68.0 in | Wt 138.7 lb

## 2015-08-06 DIAGNOSIS — C799 Secondary malignant neoplasm of unspecified site: Secondary | ICD-10-CM

## 2015-08-06 DIAGNOSIS — G893 Neoplasm related pain (acute) (chronic): Secondary | ICD-10-CM | POA: Diagnosis not present

## 2015-08-06 DIAGNOSIS — C801 Malignant (primary) neoplasm, unspecified: Secondary | ICD-10-CM | POA: Diagnosis not present

## 2015-08-06 DIAGNOSIS — F329 Major depressive disorder, single episode, unspecified: Secondary | ICD-10-CM

## 2015-08-06 DIAGNOSIS — C7951 Secondary malignant neoplasm of bone: Secondary | ICD-10-CM | POA: Diagnosis not present

## 2015-08-06 DIAGNOSIS — F419 Anxiety disorder, unspecified: Secondary | ICD-10-CM | POA: Diagnosis not present

## 2015-08-06 DIAGNOSIS — C3490 Malignant neoplasm of unspecified part of unspecified bronchus or lung: Secondary | ICD-10-CM | POA: Insufficient documentation

## 2015-08-06 LAB — COMPREHENSIVE METABOLIC PANEL (CC13)
ALBUMIN: 2.9 g/dL — AB (ref 3.5–5.0)
ALK PHOS: 76 U/L (ref 40–150)
ALT: 6 U/L (ref 0–55)
ANION GAP: 8 meq/L (ref 3–11)
AST: 10 U/L (ref 5–34)
BILIRUBIN TOTAL: 0.45 mg/dL (ref 0.20–1.20)
BUN: 11 mg/dL (ref 7.0–26.0)
CALCIUM: 9.2 mg/dL (ref 8.4–10.4)
CO2: 28 mEq/L (ref 22–29)
CREATININE: 0.7 mg/dL (ref 0.6–1.1)
Chloride: 107 mEq/L (ref 98–109)
EGFR: 84 mL/min/{1.73_m2} — AB (ref >90)
Glucose: 88 mg/dl (ref 70–140)
Potassium: 3.3 mEq/L — ABNORMAL LOW (ref 3.5–5.1)
Sodium: 142 mEq/L (ref 136–145)
TOTAL PROTEIN: 6.5 g/dL (ref 6.4–8.3)

## 2015-08-06 LAB — CBC & DIFF AND RETIC
BASO%: 0.5 % (ref 0.0–2.0)
Basophils Absolute: 0 10*3/uL (ref 0.0–0.1)
EOS ABS: 0.4 10*3/uL (ref 0.0–0.5)
EOS%: 6 % (ref 0.0–7.0)
HEMATOCRIT: 35.6 % (ref 34.8–46.6)
HGB: 11.6 g/dL (ref 11.6–15.9)
IMMATURE RETIC FRACT: 8.2 % (ref 1.60–10.00)
LYMPH%: 13.9 % — AB (ref 14.0–49.7)
MCH: 28.9 pg (ref 25.1–34.0)
MCHC: 32.6 g/dL (ref 31.5–36.0)
MCV: 88.6 fL (ref 79.5–101.0)
MONO#: 0.5 10*3/uL (ref 0.1–0.9)
MONO%: 8.4 % (ref 0.0–14.0)
NEUT#: 4.1 10*3/uL (ref 1.5–6.5)
NEUT%: 71.2 % (ref 38.4–76.8)
PLATELETS: 159 10*3/uL (ref 145–400)
RBC: 4.02 10*6/uL (ref 3.70–5.45)
RDW: 15.4 % — AB (ref 11.2–14.5)
RETIC CT ABS: 92.46 10*3/uL — AB (ref 33.70–90.70)
Retic %: 2.3 % — ABNORMAL HIGH (ref 0.70–2.10)
WBC: 5.8 10*3/uL (ref 3.9–10.3)
lymph#: 0.8 10*3/uL — ABNORMAL LOW (ref 0.9–3.3)

## 2015-08-06 MED ORDER — OXYCODONE HCL ER 10 MG PO T12A: 10.0000 mg | EXTENDED_RELEASE_TABLET | Freq: Two times a day (BID) | ORAL | Status: DC

## 2015-08-06 MED ORDER — OXYCODONE HCL 5 MG PO TABS
5.0000 mg | ORAL_TABLET | ORAL | Status: DC | PRN
Start: 2015-08-06 — End: 2015-09-24

## 2015-08-06 NOTE — Telephone Encounter (Signed)
Spoke with patient and she is aware of her new chemo appointment

## 2015-08-07 ENCOUNTER — Ambulatory Visit: Payer: Medicare Other

## 2015-08-07 ENCOUNTER — Other Ambulatory Visit: Payer: Self-pay | Admitting: *Deleted

## 2015-08-10 NOTE — Progress Notes (Signed)
  Radiation Oncology         [phone]) [phone] ________________________________  Name: Cindy Byrd MRN: [id number]  Date: 07/28/2015  DOB: 12-Feb-1941  End of Treatment Note    ICD-9-CM ICD-10-CM   1. Bone metastasis 198.5 C79.51     DIAGNOSIS: 74 yo woman with painful left lliac wing bone metastasis     Indication for treatment:  Palliation of pain       Radiation treatment dates:   07/15/2015-07/28/2015  Site/dose:   The left iliac bone metastasis was treated to 30 Gy in 10 fractions of 3 Gy  Beams/energy:   A 3D conformal four-field set up was used with lateral fields combining with oblique LAO and RPO beams projected along the length of the iliac wing, to avoid bowel in AP and PA projections.  6, 10 , and 15 MV photons were used.  Narrative: The patient tolerated radiation treatment relatively well.   Weight was stable. Pain resolved during RT.  Reported pain of left iliac is "alot better." Reports ambulating at home without her walker attempting to build up her strength. Denies need for in home PT. Normalized her bowel habits. Continued Decadron 2 mg once a day. No thrush noted.   Plan: The patient has completed radiation treatment. The patient will return to radiation oncology clinic for routine followup in one month. I advised her to call or return sooner if she has any questions or concerns related to her recovery or treatment. ________________________________  Sheral Apley. Tammi Klippel, M.D.

## 2015-08-10 NOTE — Progress Notes (Signed)
[name]    HEMATOLOGY ONCOLOGY PROGRESS NOTE  Date of service: 08/06/2015  Patient Care Team: Tamsen Roers, MD as PCP - General (Family Medicine) [name], MD as Consulting Physician (Hematology and Oncology)  CHIEF COMPLAINTS/PURPOSE OF CONSULTATION: Follow-up for metastatic malignancy with likely lung primary.  HISTORY OF PRESENTING ILLNESS: (plz see my previous consultation for details of initial presentation)  INTERVAL HISTORY  [name] he is here with her husband for follow-up.  She has completed radiation therapy to her painful ilial bone metastasis and notes significant pain relief with this.  She also notes that the OxyContin and oxycodone pain regimen has helped her a lot and that she is sleeping well and also has gained several pounds of weight back.  She notes that she has been able to ambulate some around the house and uses a wheelchair only for longer distances outside the house.  We discussed the fact that her EGFR mutation analysis on peripheral blood was negative.  She continues to be averse to chemotherapy options and was due to start Nivolumab today but this pending insurance approval. She notes no acute dental issues or upcoming dental procedures.  We talked about the need to also start Xgeva given her multiple bony metastases.  Note some mild bilateral leg swelling likely related to her dexamethasone.  MEDICAL HISTORY:  Past Medical History  Diagnosis Date  . IBS (irritable bowel syndrome)   . Bone neoplasm 06/24/2015  . GERD (gastroesophageal reflux disease)   . Vitamin D deficiency disease   . Cigarette smoker two packs a day or less     Currently still smoking 2 PPD - Not interested in quitting at this time.  . Colon polyps     hyperplastic, tubular adenomas, tubulovillous adenoma  . Endometriosis     Hysterectomy with BSO at age 74 yrs  . H/O: pneumonia   . Heavy smoker (more than 20 cigarettes per day) 06/24/2015  . Depression 06/24/2015  .  Hypertension 06/24/2015    likely improved incidental to 40 lbs weight loss from her neoplasm. No Longer taking med for this as of 08-06-15  . Cancer     metastatic poorly differentiated carcinoma. tumor left groin surgical removal with radiation tx.  . Cough, persistent     hx. lung cancer ? primary-being evaluated, unsure of primary site.  . Pain     left hip-persistent"tumor of bone"-radiation tx. 10.  . Swelling of ankle     bilateral   SURGICAL HISTORY: Past Surgical History  Procedure Laterality Date  . Abdominal hysterectomy    . Tonsillectomy    . Ganglion cyst excision    . Total abdominal hysterectomy w/ bilateral salpingoophorectomy  at age 74 yrs    For endometriosis  . Knee arthroscopy  age about 3 yrs  . Bartholin gland cyst excision  74 yo ago    Does not want if it was an infected cyst or tumor. Was soon as delivery  . Colonoscopy w/ polypectomy      multiple times - last done 09/2014 per patient.    SOCIAL HISTORY: Social History   Social History  . Marital Status: Widowed    Spouse Name: N/A  . Number of Children: 2  . Years of Education: N/A   Occupational History  . Not on file.   Social History Main Topics  . Smoking status: Current Every Day Smoker -- 2.00 packs/day for 60 years    Types: Cigarettes  . Smokeless tobacco: Never Used  .  Alcohol Use: No  . Drug Use: No  . Sexual Activity: No   Other Topics Concern  . Not on file   Social History Narrative    FAMILY HISTORY: Family History  Problem Relation Age of Onset  . Stroke Mother   . Colon cancer Father   . Colon cancer Brother   . Colon cancer Brother   . Breast cancer Daughter 67    ER/PR+ stage II    ALLERGIES:  is allergic to penicillins and latex. patient wonders if she has a penicillin allergy but notes that she is uncertain about this.  MEDICATIONS:  Current Outpatient Prescriptions  Medication Sig Dispense Refill  . citalopram (CELEXA) 10 MG tablet Take 1 tablet (10 mg  total) by mouth daily. (Patient taking differently: Take 10 mg by mouth every morning. ) 30 tablet 1  . docusate sodium (COLACE) 100 MG capsule Take 100 mg by mouth daily as needed for mild constipation.    [name] omeprazole (PRILOSEC OTC) 20 MG tablet Take 20 mg by mouth every morning.     [name] oxyCODONE (OXY IR/ROXICODONE) 5 MG immediate release tablet Take 1 tablet (5 mg total) by mouth every 4 (four) hours as needed for severe pain. 60 tablet 0  . OxyCODONE (OXYCONTIN) 10 mg T12A 12 hr tablet Take 1 tablet (10 mg total) by mouth every 12 (twelve) hours. 60 tablet 0  . PRESCRIPTION MEDICATION Antibody Plan [facility]     No current facility-administered medications for this visit.    REVIEW OF SYSTEMS:   Constitutional: Denies fevers, chills or abnormal night sweats Eyes: Denies blurriness of vision, double vision or watery eyes Ears, nose, mouth, throat, and face: Denies mucositis or sore throat Respiratory: Denies cough, dyspnea or wheezes Cardiovascular: Denies palpitation, chest discomfort or lower extremity swelling Gastrointestinal:  Denies nausea, heartburn or change in bowel habits Skin: Denies abnormal skin rashes Lymphatics: Denies new lymphadenopathy or easy bruising Neurological:Denies numbness, tingling or new weaknesses Behavioral/Psych: Mildly depressed affect ,Mood is stable no suicidal or homicidal ideation   All other systems were reviewed with the patient and are negative.   PHYSICAL EXAMINATION: ECOG PERFORMANCE STATUS: 2 - Symptomatic, <50% confined to bed  Filed Vitals:   08/06/15 0928  BP: 153/65  Pulse: 58  Temp: 98.2 F (36.8 C)  Resp: 18   Filed Weights   08/06/15 0928  Weight: 138 lb 11.2 oz (62.914 kg)   GENERAL:alert, appears to have her pain well controlled and seems to have gained back a few pounds. SKIN: skin color, texture, turgor are normal, no rashes or significant lesions EYES: normal, conjunctiva are pink and non-injected, sclera  clear OROPHARYNX:no exudate, no erythema and lips, buccal mucosa, and tongue normal  NECK: supple, thyroid normal size, non-tender, without nodularity LYMPH:  no palpable lymphadenopathy in the cervical, axillary or inguinal LUNGS: clear to auscultation and bilateral reduced breath sounds with normal respiratory effort  HEART: regular rate & rhythm and no murmurs and no lower extremity edema ABDOMEN:abdomen soft, non-tender and normal bowel sounds Musculoskeletal: No pedal edema. No calf pain or tenderness. Fullness over the left lateral hip and gluteal area with no overt skin changes. PSYCH: alert & oriented x 3 with fluent speech NEURO: no focal motor/sensory deficits  LABORATORY DATA:  I have reviewed the data as listed Lab Results  Component Value Date   WBC 5.8 08/06/2015   HGB 11.6 08/06/2015   HCT 35.6 08/06/2015   MCV 88.6 08/06/2015   PLT 159 08/06/2015  Recent Labs  06/11/15 2252 06/24/15 1325 07/21/15 0937 08/06/15 0904  NA 138 136 143 142  K 3.4* 3.4* 3.9 3.3*  CL 102  --   --   --   CO2 29 31* 28 28  GLUCOSE 103* 122 98 88  BUN 19 20.4 14.1 11.0  CREATININE 0.87 1.0 0.8 0.7  CALCIUM 9.6 10.2 8.9 9.2  GFRNONAA >60  --   --   --   GFRAA >60  --   --   --   PROT  --  7.2 6.4 6.5  ALBUMIN  --  2.9* 2.9* 2.9*  AST  --  13 10 10   ALT  --  7 8 6   ALKPHOS  --  102 84 76  BILITOT  --  0.45 0.46 0.45    RADIOGRAPHIC STUDIES: I have personally reviewed the radiological images as listed and agreed with the findings in the report. Ct Head W Wo Contrast  07/20/2015   CLINICAL DATA:  Metastatic carcinoma of unknown primary.  Staging  EXAM: CT HEAD WITHOUT AND WITH CONTRAST  TECHNIQUE: Contiguous axial images were obtained from the base of the skull through the vertex without and with intravenous contrast  CONTRAST:  159m OMNIPAQUE IOHEXOL 300 MG/ML  SOLN  COMPARISON:  None.  FINDINGS: The brain has a normal appearance without evidence of atrophy, infarction, mass  lesion, hemorrhage, hydrocephalus or extra-axial collection. The calvarium is unremarkable. The paranasal sinuses, middle ears and mastoids are clear. After contrast administration, no abnormal enhancement occurs.  IMPRESSION: Normal head CT.  No evidence of metastatic disease.   Electronically Signed   By: MNelson ChimesM.D.   On: 07/20/2015 15:21    ASSESSMENT & PLAN:   #1 Metastatic poorly differentiated carcinoma with likely lung primary [non-small cell lung cancer].  Less likely would possibly upper GI. CT of the head with and without contrast showed no evidence of metastatic disease. Patient notes much improved pain control. EGFR blood test mutation analysis negative  Patient's pain is much better controlled after radiation for the painful ilium met. Plan -Continue current pain regimen with bowel prophylaxis -given some mild leg edema will now discontinue her low-dose dexamethasone  -Follow-up with GI for her EGD as scheduled on 08/11/2015. - rediscussed smoking cessation. She is not keen to pursue this at this time. -we'll start Nivolumab and Xgeva on 9/9/ 2016 if approved by insurance -I will see her back in one week after getting those medications for toxicity check and preferably with repeat CT chest abdomen pelvis to get her pretreatment baseline.  #2 Anxiety and depression related to her life-threatening diagnosis of cancer . Patient did not tolerate Remeron due to nightmares   plan Continue on Celexa 10 mg by mouth daily and adjust dose as needed.  All questions were answered. The patient knows to call the clinic with any problems, questions or concerns. I spent 30 minutes counseling the patient face to face, discussing her new treatments and potential adverse effects and answering all the patients questions. The total time spent in the appointment was 40 minutes and more than 50% was on counseling.   GSullivan LoneMD MTrentonHematology/Oncology Physician CSt[facility] (Office):       [phone](Work cell):  [phone](Fax):           [phone]

## 2015-08-11 ENCOUNTER — Encounter (HOSPITAL_COMMUNITY): Payer: Self-pay | Admitting: Internal Medicine

## 2015-08-11 ENCOUNTER — Ambulatory Visit (HOSPITAL_COMMUNITY): Payer: Medicare Other | Admitting: Anesthesiology

## 2015-08-11 ENCOUNTER — Telehealth: Payer: Self-pay | Admitting: *Deleted

## 2015-08-11 ENCOUNTER — Ambulatory Visit (HOSPITAL_COMMUNITY)
Admission: RE | Admit: 2015-08-11 | Discharge: 2015-08-11 | Disposition: A | Discharge status: Home / Self Care | Payer: Medicare Other | Source: Ambulatory Visit | Attending: Internal Medicine | Admitting: Internal Medicine

## 2015-08-11 ENCOUNTER — Encounter (HOSPITAL_COMMUNITY)
Admission: RE | Disposition: A | Discharge status: Home / Self Care | Payer: Self-pay | Source: Ambulatory Visit | Attending: Internal Medicine

## 2015-08-11 ENCOUNTER — Telehealth: Payer: Self-pay | Admitting: Hematology

## 2015-08-11 DIAGNOSIS — Z7951 Long term (current) use of inhaled steroids: Secondary | ICD-10-CM | POA: Insufficient documentation

## 2015-08-11 DIAGNOSIS — K2270 Barrett's esophagus without dysplasia: Secondary | ICD-10-CM | POA: Diagnosis not present

## 2015-08-11 DIAGNOSIS — Z86010 Personal history of colonic polyps: Secondary | ICD-10-CM | POA: Diagnosis not present

## 2015-08-11 DIAGNOSIS — I10 Essential (primary) hypertension: Secondary | ICD-10-CM | POA: Insufficient documentation

## 2015-08-11 DIAGNOSIS — C801 Malignant (primary) neoplasm, unspecified: Secondary | ICD-10-CM | POA: Diagnosis present

## 2015-08-11 DIAGNOSIS — K449 Diaphragmatic hernia without obstruction or gangrene: Secondary | ICD-10-CM | POA: Insufficient documentation

## 2015-08-11 DIAGNOSIS — D378 Neoplasm of uncertain behavior of other specified digestive organs: Secondary | ICD-10-CM | POA: Insufficient documentation

## 2015-08-11 DIAGNOSIS — K2970 Gastritis, unspecified, without bleeding: Secondary | ICD-10-CM

## 2015-08-11 DIAGNOSIS — K219 Gastro-esophageal reflux disease without esophagitis: Secondary | ICD-10-CM | POA: Insufficient documentation

## 2015-08-11 DIAGNOSIS — K589 Irritable bowel syndrome without diarrhea: Secondary | ICD-10-CM | POA: Diagnosis not present

## 2015-08-11 DIAGNOSIS — F17210 Nicotine dependence, cigarettes, uncomplicated: Secondary | ICD-10-CM | POA: Diagnosis not present

## 2015-08-11 DIAGNOSIS — C7951 Secondary malignant neoplasm of bone: Secondary | ICD-10-CM | POA: Diagnosis not present

## 2015-08-11 DIAGNOSIS — K22711 Barrett's esophagus with high grade dysplasia: Secondary | ICD-10-CM | POA: Insufficient documentation

## 2015-08-11 DIAGNOSIS — R634 Abnormal weight loss: Secondary | ICD-10-CM | POA: Insufficient documentation

## 2015-08-11 DIAGNOSIS — F329 Major depressive disorder, single episode, unspecified: Secondary | ICD-10-CM | POA: Diagnosis not present

## 2015-08-11 DIAGNOSIS — C799 Secondary malignant neoplasm of unspecified site: Secondary | ICD-10-CM

## 2015-08-11 DIAGNOSIS — Z6821 Body mass index (BMI) 21.0-21.9, adult: Secondary | ICD-10-CM | POA: Diagnosis not present

## 2015-08-11 DIAGNOSIS — C159 Malignant neoplasm of esophagus, unspecified: Secondary | ICD-10-CM

## 2015-08-11 DIAGNOSIS — Z79891 Long term (current) use of opiate analgesic: Secondary | ICD-10-CM | POA: Diagnosis not present

## 2015-08-11 HISTORY — DX: Cough: R05

## 2015-08-11 HISTORY — DX: Pain, unspecified: R52

## 2015-08-11 HISTORY — DX: Malignant neoplasm of esophagus, unspecified: C15.9

## 2015-08-11 HISTORY — PX: ESOPHAGOGASTRODUODENOSCOPY (EGD) WITH PROPOFOL: SHX5813

## 2015-08-11 HISTORY — DX: Chronic cough: R05.3

## 2015-08-11 SURGERY — ESOPHAGOGASTRODUODENOSCOPY (EGD) WITH PROPOFOL
Anesthesia: Monitor Anesthesia Care

## 2015-08-11 MED ORDER — LACTATED RINGERS IV SOLN
INTRAVENOUS | Status: DC
  Administered 2015-08-11: 11:00:00 via INTRAVENOUS

## 2015-08-11 MED ORDER — PROPOFOL 10 MG/ML IV BOLUS
INTRAVENOUS | Status: AC
Start: 2015-08-11 — End: 2015-08-11
  Filled 2015-08-11: qty 20

## 2015-08-11 MED ORDER — LIDOCAINE HCL (CARDIAC) 20 MG/ML IV SOLN
INTRAVENOUS | Status: AC
  Filled 2015-08-11: qty 5

## 2015-08-11 MED ORDER — PROPOFOL 500 MG/50ML IV EMUL
INTRAVENOUS | Status: DC | PRN
  Administered 2015-08-11: 40 mg via INTRAVENOUS

## 2015-08-11 MED ORDER — SODIUM CHLORIDE 0.9 % IV SOLN: INTRAVENOUS | Status: DC

## 2015-08-11 MED ORDER — PROPOFOL 10 MG/ML IV BOLUS
INTRAVENOUS | Status: AC
  Filled 2015-08-11: qty 20

## 2015-08-11 MED ORDER — PROPOFOL INFUSION 10 MG/ML OPTIME
INTRAVENOUS | Status: DC | PRN
  Administered 2015-08-11: 140 ug/kg/min via INTRAVENOUS

## 2015-08-11 SURGICAL SUPPLY — 15 items

## 2015-08-11 NOTE — Telephone Encounter (Signed)
lvm for pt regarding to Sept appt mailed pt appt sched andl etter

## 2015-08-11 NOTE — Telephone Encounter (Signed)
Called pt and lvm to inform her PET scan has been rescheduled from 9/29 to 9/15 @ 12pm per Dr Irene Limbo.

## 2015-08-11 NOTE — Op Note (Signed)
[place], [id number]   ENDOSCOPY PROCEDURE REPORT  PATIENT: Cindy Byrd, Cindy Byrd  MR#: [id number] BIRTHDATE: 1941/09/12 , 74  yrs. old GENDER: female ENDOSCOPIST: [name], MD REFERRED BY:  Dr. Irene Limbo ([place]) PROCEDURE DATE:  08/11/2015 PROCEDURE:  EGD, diagnostic and EGD w/ biopsy ASA CLASS:     Class III INDICATIONS:  carcinoma of unknown primary, to exclude upper GI tract malignancy. MEDICATIONS: Monitored anesthesia care and Per Anesthesia TOPICAL ANESTHETIC: none  DESCRIPTION OF PROCEDURE: After the risks benefits and alternatives of the procedure were thoroughly explained, informed consent was obtained.  The EG [id number] ( [id number] ) endoscope was introduced through the mouth and advanced to the second portion of the duodenum , Without limitations.  The instrument was slowly withdrawn as the mucosa was fully examined.  ESOPHAGUS: There was a 4cm segment of suspected Barrett's esophagus found in the distal esophagus (top of gastric folds at 38 cm, top of Barrett's segment 34 cm, diaphragmatic hiatus 41 cm).  There was no nodular mucosa noted in the Barrett's segment.  Multiple biopsies were performed using cold forceps.  If confirmed C4M5.  STOMACH: A 3 cm hiatal hernia was noted.   Mild gastritis (inflammation) was found in the gastric antrum without erosion or ulceration.  Cold forcep biopsies were taken at the gastric body, antrum and angularis to evaluate for h.  pylori.  DUODENUM: The duodenal mucosa showed no abnormalities in the bulb and 2nd part of the duodenum.  Retroflexed views revealed a hiatal hernia.     The scope was then withdrawn from the patient and the procedure completed.  COMPLICATIONS: There were no immediate complications.  ENDOSCOPIC IMPRESSION: 1.   There was a 4cm segment of suspected Barrett's esophagus found in the distal esophagus; multiple biopsies were performed 2.   3 cm hiatal  hernia 3.   Mild gastritis (inflammation) was found in the gastric antrum; biopsies to exclude H. pylori 4.   The duodenal mucosa showed no abnormalities in the bulb and 2nd part of the duodenum 5.   No malignancy seen in the upper GI tract  RECOMMENDATIONS: 1.  Await biopsy results 2.  Follow-up with oncology as scheduled  eSigned:  [name], MD 08/11/2015 11:42 AM     CC: the patient, PCP, Dr. Irene Limbo

## 2015-08-11 NOTE — Discharge Instructions (Signed)

## 2015-08-11 NOTE — Interval H&P Note (Signed)
History and Physical Interval Note: For EGD today as discussed recently in clinic The nature of the procedure, as well as the risks, benefits, and alternatives were carefully and thoroughly reviewed with the patient. Ample time for discussion and questions allowed. The patient understood, was satisfied, and agreed to proceed.     08/11/2015 10:58 AM  Cindy Byrd  has presented today for surgery, with the diagnosis of Metastatic cancer  The various methods of treatment have been discussed with the patient and family. After consideration of risks, benefits and other options for treatment, the patient has consented to  Procedure(s): ESOPHAGOGASTRODUODENOSCOPY (EGD) WITH PROPOFOL (N/A) as a surgical intervention .  The patient's history has been reviewed, patient examined, no change in status, stable for surgery.  I have reviewed the patient's chart and labs.  Questions were answered to the patient's satisfaction.     Akiah Bauch M

## 2015-08-11 NOTE — H&P (View-Only) (Signed)
08/04/2015 Cindy Byrd [id number] [birth year]   HISTORY OF PRESENT ILLNESS:  This is a 74 year old female who has been diagnosed with metastatic cancer of uncertain primary.  After imaging and biopsy, it is highly suspicious for primary lung (small cell) cancer.  Apparently she elicited some epigastric discomfort to the oncologist, Dr. Irene Limbo, who referred her here for EGD.  Wants to rule out UGI source of malignancy.  She denies any GI complaints to me.  She does have chronic GERD, but it is well controlled on omeprazole 20 mg daily.  Has never undergone EGD.  Admits to 35 pound weight loss recently.  Has large necrotic lesion in left iliac bone that has been radiated due to pain and inability to walk; has had some improvement since completing radiation treatments.  Other treatment will depend on definitive cancer diagnosis.  Has been followed by Dr. Earlean Shawl for colonoscopies with the last one in 08/2013.  Has history of several adenomatous polyps but colonoscopy 08/2013 only showed two polyps (one tubular adenoma and one hyperplastic polyp) and diverticulosis.  Colonoscopy records from 2001, 2004, 2008, and 2014 are going to be scanned into EPIC.   Past Medical History  Diagnosis Date  . IBS (irritable bowel syndrome)   . Bone neoplasm 06/24/2015  . Hypertension 06/24/2015    likely improved incidental to 40 lbs weight loss from her neoplasm  . GERD (gastroesophageal reflux disease)   . Vitamin D deficiency disease   . Cigarette smoker two packs a day or less     Currently still smoking 2 PPD - Not interested in quitting at this time.  . Colon polyps     hyperplastic, tubular adenomas, tubulovillous adenoma  . Endometriosis     Hysterectomy with BSO at age 89 yrs  . H/O: pneumonia   . Heavy smoker (more than 20 cigarettes per day) 06/24/2015  . Depression 06/24/2015  . Cancer     metastatic poorly differentiated carcinoma   Past Surgical History  Procedure Laterality Date  .  Abdominal hysterectomy    . Tonsillectomy    . Ganglion cyst excision    . Total abdominal hysterectomy w/ bilateral salpingoophorectomy  at age 12 yrs    For endometriosis  . Knee arthroscopy  age about 59 yrs  . Bartholin gland cyst excision  74 yo ago    Does not want if it was an infected cyst or tumor. Was soon as delivery  . Colonoscopy w/ polypectomy      multiple times - last done 09/2014 per patient.    reports that she has been smoking Cigarettes.  She has a 120 pack-year smoking history. She has never used smokeless tobacco. She reports that she does not drink alcohol or use illicit drugs. family history includes Breast cancer (age of onset: 42) in her daughter; Colon cancer in her brother, brother, and father; Stroke in her mother. Allergies  Allergen Reactions  . Penicillins Other (See Comments)    Unknown; childhood allergy      Outpatient Encounter Prescriptions as of 08/04/2015  Medication Sig  . citalopram (CELEXA) 10 MG tablet Take 1 tablet (10 mg total) by mouth daily.  [name] docusate sodium (COLACE) 100 MG capsule Take 100 mg by mouth daily as needed for mild constipation.  [name] omeprazole (PRILOSEC OTC) 20 MG tablet Take 20 mg by mouth daily.  . OxyCODONE (OXYCONTIN) 10 mg T12A 12 hr tablet Take 1 tablet (10 mg total) by mouth every 12 (  twelve) hours.  . [DISCONTINUED] amLODipine (NORVASC) 5 MG tablet   . [DISCONTINUED] bisoprolol (ZEBETA) 5 MG tablet Take 5 mg by mouth daily.  . [DISCONTINUED] dexamethasone (DECADRON) 2 MG tablet Take 1 tablet (2 mg total) by mouth daily.  . [DISCONTINUED] hydrochlorothiazide (MICROZIDE) 12.5 MG capsule   . [DISCONTINUED] mirtazapine (REMERON) 15 MG tablet   . [DISCONTINUED] ondansetron (ZOFRAN) 4 MG tablet Take 1 tablet (4 mg total) by mouth every 8 (eight) hours as needed for nausea or vomiting. (Patient not taking: Reported on 07/24/2015)  . [DISCONTINUED] oxyCODONE (OXY IR/ROXICODONE) 5 MG immediate release tablet Take 1 tablet (5 mg  total) by mouth every 4 (four) hours as needed for severe pain.  . [DISCONTINUED] senna-docusate (SENOKOT-S) 8.6-50 MG per tablet Take 2 tablets by mouth at bedtime as needed for mild constipation.   No facility-administered encounter medications on file as of 08/04/2015.     REVIEW OF SYSTEMS  : All other systems reviewed and negative except where noted in the History of Present Illness.   PHYSICAL EXAM: BP 150/60 mmHg  Pulse 60  Ht '5\' 8"'$  (1.727 m)  Wt 138 lb 12.8 oz (62.959 kg)  BMI 21.11 kg/m2 General: Well developed white female in no acute distress Head: Normocephalic and atraumatic Eyes:  Sclerae anicteric, conjunctiva pink. Ears: Normal auditory acuity Lungs: Wheezing noted B/L Heart: Regular rate and rhythm Abdomen: Soft, non-distended.  BS present.  Non-tender. Musculoskeletal: Symmetrical with no gross deformities  Skin: No lesions on visible extremities Extremities: No edema  Neurological: Alert oriented x 4, grossly non-focal Psychological:  Alert and cooperative. Normal mood and affect  ASSESSMENT AND PLAN: -Metastatic cancer of uncertain primary:  Highly suspicious for small cell lung cancer, but oncology requesting EGD to rule out UGI source.   -Chronic GERD on omeprazole 20 mg daily with good control of symptoms.  Never had EGD. -Personal history of multiple colon polyps:  Followed regularly by Dr. Earlean Shawl with last colonoscopy 08/2013.   CC:  Cindy Genera, MD   Addendum: Reviewed and agree with initial management. Cindy Bears, MD

## 2015-08-11 NOTE — Transfer of Care (Signed)
Immediate Anesthesia Transfer of Care Note  Patient: Cindy Byrd  Procedure(s) Performed: Procedure(s): ESOPHAGOGASTRODUODENOSCOPY (EGD) WITH PROPOFOL (N/A)  Patient Location: PACU  Anesthesia Type:MAC  Level of Consciousness: awake, alert  and oriented  Airway & Oxygen Therapy: Patient Spontanous Breathing and Patient connected to nasal cannula oxygen  Post-op Assessment: Report given to RN and Post -op Vital signs reviewed and stable  Post vital signs: Reviewed and stable  Last Vitals:  Filed Vitals:   08/11/15 1015  BP: 115/86  Pulse: 59  Temp: 36.7 C  Resp: 16    Complications: No apparent anesthesia complications

## 2015-08-11 NOTE — Anesthesia Preprocedure Evaluation (Addendum)
Anesthesia Evaluation  Patient identified by MRN, date of birth, ID band Patient awake    Reviewed: Allergy & Precautions, NPO status , Patient's Chart, lab work & pertinent test results  Airway Mallampati: I  TM Distance: >3 FB Neck ROM: Full    Dental   Pulmonary Current Smoker,    Pulmonary exam normal       Cardiovascular hypertension, Pt. on medications Normal cardiovascular exam    Neuro/Psych Depression    GI/Hepatic GERD-  Medicated and Controlled,  Endo/Other    Renal/GU      Musculoskeletal   Abdominal   Peds  Hematology   Anesthesia Other Findings   Reproductive/Obstetrics                            Anesthesia Physical Anesthesia Plan  ASA: II  Anesthesia Plan: MAC   Post-op Pain Management:    Induction: Intravenous  Airway Management Planned: Simple Face Mask  Additional Equipment:   Intra-op Plan:   Post-operative Plan:   Informed Consent: I have reviewed the patients History and Physical, chart, labs and discussed the procedure including the risks, benefits and alternatives for the proposed anesthesia with the patient or authorized representative who has indicated his/her understanding and acceptance.     Plan Discussed with: CRNA and Surgeon  Anesthesia Plan Comments:         Anesthesia Quick Evaluation

## 2015-08-11 NOTE — Anesthesia Postprocedure Evaluation (Signed)
Anesthesia Post Note  Patient: Cindy Byrd  Procedure(s) Performed: Procedure(s) (LRB): ESOPHAGOGASTRODUODENOSCOPY (EGD) WITH PROPOFOL (N/A)  Anesthesia type: MAC  Patient location: PACU  Post pain: Pain level controlled  Post assessment: Patient's Cardiovascular Status Stable  Last Vitals:  Filed Vitals:   08/11/15 1206  BP:   Pulse: 62  Temp:   Resp: 19    Post vital signs: Reviewed and stable  Level of consciousness: sedated  Complications: No apparent anesthesia complications

## 2015-08-12 ENCOUNTER — Encounter (HOSPITAL_COMMUNITY): Payer: Self-pay | Admitting: Internal Medicine

## 2015-08-13 ENCOUNTER — Other Ambulatory Visit: Payer: Self-pay | Admitting: *Deleted

## 2015-08-13 DIAGNOSIS — C3490 Malignant neoplasm of unspecified part of unspecified bronchus or lung: Secondary | ICD-10-CM

## 2015-08-14 ENCOUNTER — Ambulatory Visit (HOSPITAL_BASED_OUTPATIENT_CLINIC_OR_DEPARTMENT_OTHER): Payer: Medicare Other

## 2015-08-14 ENCOUNTER — Ambulatory Visit: Payer: Medicare Other

## 2015-08-14 ENCOUNTER — Other Ambulatory Visit (HOSPITAL_BASED_OUTPATIENT_CLINIC_OR_DEPARTMENT_OTHER): Payer: Medicare Other

## 2015-08-14 VITALS — BP 104/61 | HR 87 | Resp 20

## 2015-08-14 DIAGNOSIS — C3490 Malignant neoplasm of unspecified part of unspecified bronchus or lung: Secondary | ICD-10-CM

## 2015-08-14 DIAGNOSIS — Z5112 Encounter for antineoplastic immunotherapy: Secondary | ICD-10-CM

## 2015-08-14 DIAGNOSIS — C801 Malignant (primary) neoplasm, unspecified: Secondary | ICD-10-CM | POA: Diagnosis not present

## 2015-08-14 DIAGNOSIS — C7951 Secondary malignant neoplasm of bone: Secondary | ICD-10-CM

## 2015-08-14 LAB — COMPREHENSIVE METABOLIC PANEL (CC13)
ALT: 8 U/L (ref 0–55)
ANION GAP: 11 meq/L (ref 3–11)
AST: 13 U/L (ref 5–34)
Albumin: 2.9 g/dL — ABNORMAL LOW (ref 3.5–5.0)
Alkaline Phosphatase: 86 U/L (ref 40–150)
BILIRUBIN TOTAL: 0.86 mg/dL (ref 0.20–1.20)
BUN: 15.3 mg/dL (ref 7.0–26.0)
CALCIUM: 9.8 mg/dL (ref 8.4–10.4)
CO2: 30 meq/L — AB (ref 22–29)
CREATININE: 0.9 mg/dL (ref 0.6–1.1)
Chloride: 98 mEq/L (ref 98–109)
EGFR: 63 mL/min/{1.73_m2} — ABNORMAL LOW (ref >90)
Glucose: 101 mg/dl (ref 70–140)
Potassium: 3.2 mEq/L — ABNORMAL LOW (ref 3.5–5.1)
Sodium: 138 mEq/L (ref 136–145)
TOTAL PROTEIN: 7.1 g/dL (ref 6.4–8.3)

## 2015-08-14 LAB — CBC WITH DIFFERENTIAL/PLATELET
BASO%: 1.2 % (ref 0.0–2.0)
Basophils Absolute: 0.1 10*3/uL (ref 0.0–0.1)
EOS%: 5.7 % (ref 0.0–7.0)
Eosinophils Absolute: 0.3 10*3/uL (ref 0.0–0.5)
HEMATOCRIT: 38.4 % (ref 34.8–46.6)
HGB: 12.6 g/dL (ref 11.6–15.9)
LYMPH#: 0.6 10*3/uL — AB (ref 0.9–3.3)
LYMPH%: 12.1 % — ABNORMAL LOW (ref 14.0–49.7)
MCH: 28.4 pg (ref 25.1–34.0)
MCHC: 32.7 g/dL (ref 31.5–36.0)
MCV: 86.8 fL (ref 79.5–101.0)
MONO#: 0.5 10*3/uL (ref 0.1–0.9)
MONO%: 8.9 % (ref 0.0–14.0)
NEUT%: 72.1 % (ref 38.4–76.8)
NEUTROS ABS: 3.8 10*3/uL (ref 1.5–6.5)
PLATELETS: 202 10*3/uL (ref 145–400)
RBC: 4.43 10*6/uL (ref 3.70–5.45)
RDW: 15.5 % — AB (ref 11.2–14.5)
WBC: 5.3 10*3/uL (ref 3.9–10.3)

## 2015-08-14 MED ORDER — DIPHENHYDRAMINE HCL 50 MG/ML IJ SOLN
25.0000 mg | Freq: Once | INTRAMUSCULAR | Status: AC
  Administered 2015-08-14: 25 mg via INTRAVENOUS

## 2015-08-14 MED ORDER — DENOSUMAB 120 MG/1.7ML ~~LOC~~ SOLN
120.0000 mg | Freq: Once | SUBCUTANEOUS | Status: AC
  Administered 2015-08-14: 120 mg via SUBCUTANEOUS
  Filled 2015-08-14: qty 1.7

## 2015-08-14 MED ORDER — SODIUM CHLORIDE 0.9 % IV SOLN
3.2000 mg/kg | Freq: Once | INTRAVENOUS | Status: AC
  Administered 2015-08-14: 200 mg via INTRAVENOUS
  Filled 2015-08-14: qty 20

## 2015-08-14 MED ORDER — FAMOTIDINE IN NACL 20-0.9 MG/50ML-% IV SOLN
INTRAVENOUS | Status: AC
  Filled 2015-08-14: qty 50

## 2015-08-14 MED ORDER — FAMOTIDINE IN NACL 20-0.9 MG/50ML-% IV SOLN
20.0000 mg | Freq: Once | INTRAVENOUS | Status: AC
  Administered 2015-08-14: 20 mg via INTRAVENOUS

## 2015-08-14 MED ORDER — SODIUM CHLORIDE 0.9 % IV SOLN
Freq: Once | INTRAVENOUS | Status: AC
  Administered 2015-08-14: 09:00:00 via INTRAVENOUS

## 2015-08-14 MED ORDER — DIPHENHYDRAMINE HCL 50 MG/ML IJ SOLN
INTRAMUSCULAR | Status: AC
Start: 2015-08-14 — End: 2015-08-14
  Filled 2015-08-14: qty 1

## 2015-08-14 NOTE — Patient Instructions (Addendum)
Nivolumab injection What is this medicine? NIVOLUMAB (nye VOL ue mab) is used to treat certain types of melanoma and lung cancer. This medicine may be used for other purposes; ask your health care provider or pharmacist if you have questions. COMMON BRAND NAME(S): Opdivo What should I tell my health care provider before I take this medicine? They need to know if you have any of these conditions: -eye disease, vision problems -history of pancreatitis -immune system problems -inflammatory bowel disease -kidney disease -liver disease -lung disease -lupus -myasthenia gravis -multiple sclerosis -organ transplant -stomach or intestine problems -thyroid disease -tingling of the fingers or toes, or other nerve disorder -an unusual or allergic reaction to nivolumab, other medicines, foods, dyes, or preservatives -pregnant or trying to get pregnant -breast-feeding How should I use this medicine? This medicine is for infusion into a vein. It is given by a health care professional in a hospital or clinic setting. A special MedGuide will be given to you before each treatment. Be sure to read this information carefully each time. Talk to your pediatrician regarding the use of this medicine in children. Special care may be needed. Overdosage: If you think you've taken too much of this medicine contact a poison control center or emergency room at once. Overdosage: If you think you have taken too much of this medicine contact a poison control center or emergency room at once. NOTE: This medicine is only for you. Do not share this medicine with others. What if I miss a dose? It is important not to miss your dose. Call your doctor or health care professional if you are unable to keep an appointment. What may interact with this medicine? Interactions have not been studied. This list may not describe all possible interactions. Give your health care provider a list of all the medicines, herbs,  non-prescription drugs, or dietary supplements you use. Also tell them if you smoke, drink alcohol, or use illegal drugs. Some items may interact with your medicine. What should I watch for while using this medicine? Tell your doctor or healthcare professional if your symptoms do not start to get better or if they get worse. Your condition will be monitored carefully while you are receiving this medicine. You may need blood work done while you are taking this medicine. What side effects may I notice from receiving this medicine? Side effects that you should report to your doctor or health care professional as soon as possible: -allergic reactions like skin rash, itching or hives, swelling of the face, lips, or tongue -black, tarry stools -bloody or watery diarrhea -changes in vision -chills -cough -depressed mood -eye pain -feeling anxious -fever -general ill feeling or flu-like symptoms -hair loss -loss of appetite -low blood counts - this medicine may decrease the number of white blood cells, red blood cells and platelets. You may be at increased risk for infections and bleeding -pain, tingling, numbness in the hands or feet -redness, blistering, peeling or loosening of the skin, including inside the mouth -red pinpoint spots on skin -signs of decreased platelets or bleeding - bruising, pinpoint red spots on the skin, black, tarry stools, blood in the urine -signs of decreased red blood cells - unusually weak or tired, feeling faint or lightheaded, falls -signs of infection - fever or chills, cough, sore throat, pain or trouble passing urine -signs and symptoms of a dangerous change in heartbeat or heart rhythm like chest pain; dizziness; fast or irregular heartbeat; palpitations; feeling faint or lightheaded, falls; breathing problems -signs   and symptoms of high blood sugar such as dizziness; dry mouth; dry skin; fruity breath; nausea; stomach pain; increased hunger or thirst; increased  urination -signs and symptoms of kidney injury like trouble passing urine or change in the amount of urine -signs and symptoms of liver injury like dark yellow or brown urine; general ill feeling or flu-like symptoms; light-colored stools; loss of appetite; nausea; right upper belly pain; unusually weak or tired; yellowing of the eyes or skin -signs and symptoms of increased potassium like muscle weakness; chest pain; or fast, irregular heartbeat -signs and symptoms of low potassium like muscle cramps or muscle pain; chest pain; dizziness; feeling faint or lightheaded, falls; palpitations; breathing problems; or fast, irregular heartbeat -swelling of the ankles, feet, hands -weight gainSide effects that usually do not require medical attention (report to your doctor or health care professional if they continue or are bothersome): -constipation -general ill feeling or flu-like symptoms -hair loss -loss of appetite -nausea, vomiting This list may not describe all possible side effects. Call your doctor for medical advice about side effects. You may report side effects to FDA at 1-800-FDA-1088. Where should I keep my medicine? This drug is given in a hospital or clinic and will not be stored at home. NOTE: This sheet is a summary. It may not cover all possible information. If you have questions about this medicine, talk to your doctor, pharmacist, or health care provider.  2015, [employer] Standard. (2014-02-10 13:18:19) Denosumab injection What is this medicine? DENOSUMAB (den oh sue mab) slows bone breakdown. Prolia is used to treat osteoporosis in women after menopause and in men. Delton See is used to prevent bone fractures and other bone problems caused by cancer bone metastases. Delton See is also used to treat giant cell tumor of the bone. This medicine may be used for other purposes; ask your health care provider or pharmacist if you have questions. COMMON BRAND NAME(S): Prolia, XGEVA What  should I tell my health care provider before I take this medicine? They need to know if you have any of these conditions: -dental disease -eczema -infection or history of infections -kidney disease or on dialysis -low blood calcium or vitamin D -malabsorption syndrome -scheduled to have surgery or tooth extraction -taking medicine that contains denosumab -thyroid or parathyroid disease -an unusual reaction to denosumab, other medicines, foods, dyes, or preservatives -pregnant or trying to get pregnant -breast-feeding How should I use this medicine? This medicine is for injection under the skin. It is given by a health care professional in a hospital or clinic setting. If you are getting Prolia, a special MedGuide will be given to you by the pharmacist with each prescription and refill. Be sure to read this information carefully each time. For Prolia, talk to your pediatrician regarding the use of this medicine in children. Special care may be needed. For Delton See, talk to your pediatrician regarding the use of this medicine in children. While this drug may be prescribed for children as young as 13 years for selected conditions, precautions do apply. Overdosage: If you think you've taken too much of this medicine contact a poison control center or emergency room at once. Overdosage: If you think you have taken too much of this medicine contact a poison control center or emergency room at once. NOTE: This medicine is only for you. Do not share this medicine with others. What if I miss a dose? It is important not to miss your dose. Call your doctor or health care professional if you are  unable to keep an appointment. What may interact with this medicine? Do not take this medicine with any of the following medications: -other medicines containing denosumab This medicine may also interact with the following medications: -medicines that suppress the immune system -medicines that treat  cancer -steroid medicines like prednisone or cortisone This list may not describe all possible interactions. Give your health care provider a list of all the medicines, herbs, non-prescription drugs, or dietary supplements you use. Also tell them if you smoke, drink alcohol, or use illegal drugs. Some items may interact with your medicine. What should I watch for while using this medicine? Visit your doctor or health care professional for regular checks on your progress. Your doctor or health care professional may order blood tests and other tests to see how you are doing. Call your doctor or health care professional if you get a cold or other infection while receiving this medicine. Do not treat yourself. This medicine may decrease your body's ability to fight infection. You should make sure you get enough calcium and vitamin D while you are taking this medicine, unless your doctor tells you not to. Discuss the foods you eat and the vitamins you take with your health care professional. See your dentist regularly. Brush and floss your teeth as directed. Before you have any dental work done, tell your dentist you are receiving this medicine. Do not become pregnant while taking this medicine or for 5 months after stopping it. Women should inform their doctor if they wish to become pregnant or think they might be pregnant. There is a potential for serious side effects to an unborn child. Talk to your health care professional or pharmacist for more information. What side effects may I notice from receiving this medicine? Side effects that you should report to your doctor or health care professional as soon as possible: -allergic reactions like skin rash, itching or hives, swelling of the face, lips, or tongue -breathing problems -chest pain -fast, irregular heartbeat -feeling faint or lightheaded, falls -fever, chills, or any other sign of infection -muscle spasms, tightening, or twitches -numbness or  tingling -skin blisters or bumps, or is dry, peels, or red -slow healing or unexplained pain in the mouth or jaw -unusual bleeding or bruising Side effects that usually do not require medical attention (Report these to your doctor or health care professional if they continue or are bothersome.): -muscle pain -stomach upset, gas This list may not describe all possible side effects. Call your doctor for medical advice about side effects. You may report side effects to FDA at 1-800-FDA-1088. Where should I keep my medicine? This medicine is only given in a clinic, doctor's office, or other health care setting and will not be stored at home. NOTE: This sheet is a summary. It may not cover all possible information. If you have questions about this medicine, talk to your doctor, pharmacist, or health care provider.  2015, [employer]/Gold Standard. (2012-05-21 12:37:47)

## 2015-08-14 NOTE — Progress Notes (Signed)
Xgeva injection given by infusion nurse.

## 2015-08-16 ENCOUNTER — Other Ambulatory Visit: Payer: Self-pay | Admitting: Hematology

## 2015-08-17 ENCOUNTER — Other Ambulatory Visit: Payer: Self-pay | Admitting: Hematology

## 2015-08-17 ENCOUNTER — Other Ambulatory Visit: Payer: Self-pay | Admitting: Radiology

## 2015-08-18 ENCOUNTER — Encounter (HOSPITAL_COMMUNITY): Payer: Self-pay

## 2015-08-18 ENCOUNTER — Ambulatory Visit (HOSPITAL_COMMUNITY)
Admission: RE | Admit: 2015-08-18 | Discharge: 2015-08-18 | Disposition: A | Discharge status: Home / Self Care | Payer: Medicare Other | Source: Ambulatory Visit | Attending: Hematology | Admitting: Hematology

## 2015-08-18 ENCOUNTER — Other Ambulatory Visit: Payer: Self-pay | Admitting: Hematology

## 2015-08-18 DIAGNOSIS — F17210 Nicotine dependence, cigarettes, uncomplicated: Secondary | ICD-10-CM | POA: Diagnosis not present

## 2015-08-18 DIAGNOSIS — C799 Secondary malignant neoplasm of unspecified site: Secondary | ICD-10-CM

## 2015-08-18 DIAGNOSIS — C3490 Malignant neoplasm of unspecified part of unspecified bronchus or lung: Secondary | ICD-10-CM | POA: Diagnosis not present

## 2015-08-18 LAB — CBC WITH DIFFERENTIAL/PLATELET
BASOS ABS: 0 10*3/uL (ref 0.0–0.1)
Basophils Relative: 0 % (ref 0–1)
Eosinophils Absolute: 0.2 10*3/uL (ref 0.0–0.7)
Eosinophils Relative: 3 % (ref 0–5)
HEMATOCRIT: 39.5 % (ref 36.0–46.0)
HEMOGLOBIN: 12.8 g/dL (ref 12.0–15.0)
LYMPHS ABS: 0.9 10*3/uL (ref 0.7–4.0)
LYMPHS PCT: 13 % (ref 12–46)
MCH: 28.6 pg (ref 26.0–34.0)
MCHC: 32.4 g/dL (ref 30.0–36.0)
MCV: 88.4 fL (ref 78.0–100.0)
Monocytes Absolute: 0.5 10*3/uL (ref 0.1–1.0)
Monocytes Relative: 8 % (ref 3–12)
NEUTROS ABS: 5.2 10*3/uL (ref 1.7–7.7)
NEUTROS PCT: 76 % (ref 43–77)
PLATELETS: 245 10*3/uL (ref 150–400)
RBC: 4.47 MIL/uL (ref 3.87–5.11)
RDW: 14.5 % (ref 11.5–15.5)
WBC: 6.8 10*3/uL (ref 4.0–10.5)

## 2015-08-18 LAB — BASIC METABOLIC PANEL
ANION GAP: 12 (ref 5–15)
BUN: 17 mg/dL (ref 6–20)
CHLORIDE: 95 mmol/L — AB (ref 101–111)
CO2: 28 mmol/L (ref 22–32)
Calcium: 8.7 mg/dL — ABNORMAL LOW (ref 8.9–10.3)
Creatinine, Ser: 0.94 mg/dL (ref 0.44–1.00)
GFR, EST NON AFRICAN AMERICAN: 58 mL/min — AB (ref >60)
Glucose, Bld: 105 mg/dL — ABNORMAL HIGH (ref 65–99)
POTASSIUM: 3.4 mmol/L — AB (ref 3.5–5.1)
SODIUM: 135 mmol/L (ref 135–145)

## 2015-08-18 LAB — PROTIME-INR
INR: 1.05 (ref 0.00–1.49)
Prothrombin Time: 13.9 seconds (ref 11.6–15.2)

## 2015-08-18 MED ORDER — FENTANYL CITRATE (PF) 100 MCG/2ML IJ SOLN
INTRAMUSCULAR | Status: AC
  Filled 2015-08-18: qty 4

## 2015-08-18 MED ORDER — MIDAZOLAM HCL 2 MG/2ML IJ SOLN
INTRAMUSCULAR | Status: AC | PRN
  Administered 2015-08-18 (×3): 1 mg via INTRAVENOUS

## 2015-08-18 MED ORDER — SODIUM CHLORIDE 0.9 % IV SOLN
INTRAVENOUS | Status: DC
  Administered 2015-08-18: 13:00:00 via INTRAVENOUS

## 2015-08-18 MED ORDER — HEPARIN SOD (PORK) LOCK FLUSH 100 UNIT/ML IV SOLN
INTRAVENOUS | Status: AC
  Filled 2015-08-18: qty 5

## 2015-08-18 MED ORDER — FENTANYL CITRATE (PF) 100 MCG/2ML IJ SOLN
INTRAMUSCULAR | Status: AC | PRN
  Administered 2015-08-18: 50 ug via INTRAVENOUS

## 2015-08-18 MED ORDER — LIDOCAINE-EPINEPHRINE 2 %-1:100000 IJ SOLN
INTRAMUSCULAR | Status: AC
  Filled 2015-08-18: qty 1

## 2015-08-18 MED ORDER — HEPARIN SOD (PORK) LOCK FLUSH 100 UNIT/ML IV SOLN
INTRAVENOUS | Status: AC | PRN
  Administered 2015-08-18: 500 [IU]

## 2015-08-18 MED ORDER — CEFAZOLIN SODIUM-DEXTROSE 2-3 GM-% IV SOLR
INTRAVENOUS | Status: AC
  Filled 2015-08-18: qty 50

## 2015-08-18 MED ORDER — VANCOMYCIN HCL IN DEXTROSE 1-5 GM/200ML-% IV SOLN
1000.0000 mg | INTRAVENOUS | Status: DC
  Filled 2015-08-18: qty 200

## 2015-08-18 MED ORDER — LIDOCAINE HCL 1 % IJ SOLN
INTRAMUSCULAR | Status: AC
  Filled 2015-08-18: qty 20

## 2015-08-18 MED ORDER — MIDAZOLAM HCL 2 MG/2ML IJ SOLN
INTRAMUSCULAR | Status: AC
  Filled 2015-08-18: qty 6

## 2015-08-18 NOTE — Progress Notes (Signed)
Patient ID: Cindy Byrd, female   DOB: 07/29/1941, 74 y.o.   MRN: [id number]    Referring Physician(s): [name]  Chief Complaint:  Metastatic poorly differentiated carcinoma  Subjective: Pt familiar to IR service from recent left ilium lesion biopsy on 06/30/15. She has history of poorly differentiated carcinoma of lung vs GI origin. She is s/p  endoscopy on 08/11/15 with biopsy of 4 cm segment of suspected Barrett's esophagus pending. She presents today for port a cath placement for chemotherapy. She denies fevers, chills, HA, CP, dyspnea, abd/back pain,N/V or abnormal bleeding. She has occ cough (cont to smoke) and left hip pain.     Allergies: Penicillins and Latex  Medications: Prior to Admission medications   Medication Sig Start Date End Date Taking? Authorizing Provider  citalopram (CELEXA) 10 MG tablet Take 1 tablet (10 mg total) by mouth daily. Patient taking differently: Take 10 mg by mouth every morning.  07/21/15  Yes [name], MD  docusate sodium (COLACE) 100 MG capsule Take 100 mg by mouth daily as needed for mild constipation.   Yes Historical Provider, MD  omeprazole (PRILOSEC OTC) 20 MG tablet Take 20 mg by mouth every morning.    Yes Historical Provider, MD  oxyCODONE (OXY IR/ROXICODONE) 5 MG immediate release tablet Take 1 tablet (5 mg total) by mouth every 4 (four) hours as needed for severe pain. 08/06/15  Yes [name], MD  OxyCODONE (OXYCONTIN) 10 mg T12A 12 hr tablet Take 1 tablet (10 mg total) by mouth every 12 (twelve) hours. 08/06/15  Yes [name], MD  PRESCRIPTION MEDICATION Antibody Plan [place]    Historical Provider, MD     Vital Signs: BP 94/75 mmHg  Pulse 91  Temp(Src) 97.7 F (36.5 C) (Oral)  Resp 18  SpO2 99%  Physical Exam  Constitutional: She is oriented to person, place, and time. She appears well-developed and well-nourished.  Cardiovascular: Normal rate and regular rhythm.   Pulmonary/Chest: Effort  normal.  Distant BS bilat  Abdominal: Soft. Bowel sounds are normal.  Musculoskeletal: She exhibits no edema.  Neurological: She is alert and oriented to person, place, and time.    Imaging: No results found.  Labs:  CBC:  Recent Labs  06/24/15 1325 07/21/15 0937 08/06/15 0904 08/14/15 0828  WBC 10.0 7.0 5.8 5.3  HGB 13.2 12.7 11.6 12.6  HCT 39.1 39.6 35.6 38.4  PLT 208 169 159 202    COAGS:  Recent Labs  06/24/15 1325  INR 1.14  APTT 36    BMP:  Recent Labs  06/11/15 2252 06/24/15 1325 07/21/15 0937 08/06/15 0904 08/14/15 0828  NA 138 136 143 142 138  K 3.4* 3.4* 3.9 3.3* 3.2*  CL 102  --   --   --   --   CO2 29 31* 28 28 30*  GLUCOSE 103* 122 98 88 101  BUN 19 20.4 14.1 11.0 15.3  CALCIUM 9.6 10.2 8.9 9.2 9.8  CREATININE 0.87 1.0 0.8 0.7 0.9  GFRNONAA >60  --   --   --   --   GFRAA >60  --   --   --   --     LIVER FUNCTION TESTS:  Recent Labs  06/24/15 1325 07/21/15 0937 08/06/15 0904 08/14/15 0828  BILITOT 0.45 0.46 0.45 0.86  AST '13 10 10 13  '$ ALT '7 8 6 8  '$ ALKPHOS 102 84 76 86  PROT 7.2 6.4 6.5 7.1  ALBUMIN 2.9* 2.9* 2.9* 2.9*  Assessment and Plan: Pt with hx poorly diff carcinoma of ? lung vs GI origin; plan is for port a cath placement today for chemotherapy. Risks and benefits discussed with the patient including, but not limited to bleeding, infection, pneumothorax, or fibrin sheath development and need for additional procedures.All of the patient's questions were answered, patient is agreeable to proceed.Consent signed and in chart. Labs pending.      Signed: D. Rowe Robert 08/18/2015, 1:34 PM   I spent a total of 15 minutes at the the patient's bedside AND on the patient's hospital floor or unit, greater than 50% of which was counseling/coordinating care for port a cath placement

## 2015-08-18 NOTE — Procedures (Signed)
Interventional Radiology Procedure Note  Procedure: Placement of a right IJ approach single lumen PowerPort.  Tip is positioned at the superior cavoatrial junction and catheter is ready for immediate use.  Complications: No immediate Recommendations:  - Ok to shower tomorrow - Do not submerge for 7 days - Routine line care   Signed,  Jeson Camacho K. Thorin Starner, MD   

## 2015-08-18 NOTE — Discharge Instructions (Signed)
Implanted Port Insertion, Care After °Refer to this sheet in the next few weeks. These instructions provide you with information on caring for yourself after your procedure. Your health care provider may also give you more specific instructions. Your treatment has been planned according to current medical practices, but problems sometimes occur. Call your health care provider if you have any problems or questions after your procedure. °WHAT TO EXPECT AFTER THE PROCEDURE °After your procedure, it is typical to have the following:  °· Discomfort at the port insertion site. Ice packs to the area will help. °· Bruising on the skin over the port. This will subside in 3-4 days. °HOME CARE INSTRUCTIONS °· After your port is placed, you will get a manufacturer's information card. The card has information about your port. Keep this card with you at all times.   °· Know what kind of port you have. There are many types of ports available.   °· Wear a medical alert bracelet in case of an emergency. This can help alert health care workers that you have a port.   °· The port can stay in for as long as your health care provider believes it is necessary.   °· A home health care nurse may give medicines and take care of the port.   °· You or a family member can get special training and directions for giving medicine and taking care of the port at home.   °SEEK MEDICAL CARE IF:  °· Your port does not flush or you are unable to get a blood return.   °· You have a fever or chills. °SEEK IMMEDIATE MEDICAL CARE IF: °· You have new fluid or pus coming from your incision.   °· You notice a bad smell coming from your incision site.   °· You have swelling, pain, or more redness at the incision or port site.   °· You have chest pain or shortness of breath. °Document Released: 09/11/2013 Document Revised: 11/26/2013 Document Reviewed: 09/11/2013 °ExitCare® Patient Information ©2015 [employer]. This information is not intended to replace  advice given to you by your health care provider. Make sure you discuss any questions you have with your health care provider. °Implanted Port Home Guide °An implanted port is a type of central line that is placed under the skin. Central lines are used to provide IV access when treatment or nutrition needs to be given through a person's veins. Implanted ports are used for long-term IV access. An implanted port may be placed because:  °· You need IV medicine that would be irritating to the small veins in your hands or arms.   °· You need long-term IV medicines, such as antibiotics.   °· You need IV nutrition for a long period.   °· You need frequent blood draws for lab tests.   °· You need dialysis.   °Implanted ports are usually placed in the chest area, but they can also be placed in the upper arm, the abdomen, or the leg. An implanted port has two main parts:  °· Reservoir. The reservoir is round and will appear as a small, raised area under your skin. The reservoir is the part where a needle is inserted to give medicines or draw blood.   °· Catheter. The catheter is a thin, flexible tube that extends from the reservoir. The catheter is placed into a large vein. Medicine that is inserted into the reservoir goes into the catheter and then into the vein.   °HOW WILL I CARE FOR MY INCISION SITE? °Do not get the incision site wet. Bathe or   shower as directed by your health care provider.  °HOW IS MY PORT ACCESSED? °Special steps must be taken to access the port:  °· Before the port is accessed, a numbing cream can be placed on the skin. This helps numb the skin over the port site.   °· Your health care provider uses a sterile technique to access the port. °· Your health care provider must put on a mask and sterile gloves. °· The skin over your port is cleaned carefully with an antiseptic and allowed to dry. °· The port is gently pinched between sterile gloves, and a needle is inserted into the port. °· Only  "non-coring" port needles should be used to access the port. Once the port is accessed, a blood return should be checked. This helps ensure that the port is in the vein and is not clogged.   °· If your port needs to remain accessed for a constant infusion, a clear (transparent) bandage will be placed over the needle site. The bandage and needle will need to be changed every week, or as directed by your health care provider.   °· Keep the bandage covering the needle clean and dry. Do not get it wet. Follow your health care provider's instructions on how to take a shower or bath while the port is accessed.   °· If your port does not need to stay accessed, no bandage is needed over the port.   °WHAT IS FLUSHING? °Flushing helps keep the port from getting clogged. Follow your health care provider's instructions on how and when to flush the port. Ports are usually flushed with saline solution or a medicine called heparin. The need for flushing will depend on how the port is used.  °· If the port is used for intermittent medicines or blood draws, the port will need to be flushed:   °· After medicines have been given.   °· After blood has been drawn.   °· As part of routine maintenance.   °· If a constant infusion is running, the port may not need to be flushed.   °HOW LONG WILL MY PORT STAY IMPLANTED? °The port can stay in for as long as your health care provider thinks it is needed. When it is time for the port to come out, surgery will be done to remove it. The procedure is similar to the one performed when the port was put in.  °WHEN SHOULD I SEEK IMMEDIATE MEDICAL CARE? °When you have an implanted port, you should seek immediate medical care if:  °· You notice a bad smell coming from the incision site.   °· You have swelling, redness, or drainage at the incision site.   °· You have more swelling or pain at the port site or the surrounding area.   °· You have a fever that is not controlled with medicine. °Document  Released: 11/21/2005 Document Revised: 09/11/2013 Document Reviewed: 07/29/2013 °ExitCare® Patient Information ©2015 [employer]. This information is not intended to replace advice given to you by your health care provider. Make sure you discuss any questions you have with your health care provider. °Conscious Sedation °Sedation is the use of medicines to promote relaxation and relieve discomfort and anxiety. Conscious sedation is a type of sedation. Under conscious sedation you are less alert than normal but are still able to respond to instructions or stimulation. Conscious sedation is used during short medical and dental procedures. It is milder than deep sedation or general anesthesia and allows you to return to your regular activities sooner.  °LET YOUR HEALTH CARE PROVIDER   KNOW ABOUT:  °· Any allergies you have. °· All medicines you are taking, including vitamins, herbs, eye drops, creams, and over-the-counter medicines. °· Use of steroids (by mouth or creams). °· Previous problems you or members of your family have had with the use of anesthetics. °· Any blood disorders you have. °· Previous surgeries you have had. °· Medical conditions you have. °· Possibility of pregnancy, if this applies. °· Use of cigarettes, alcohol, or illegal drugs. °RISKS AND COMPLICATIONS °Generally, this is a safe procedure. However, as with any procedure, problems can occur. Possible problems include: °· Oversedation. °· Trouble breathing on your own. You may need to have a breathing tube until you are awake and breathing on your own. °· Allergic reaction to any of the medicines used for the procedure. °BEFORE THE PROCEDURE °· You may have blood tests done. These tests can help show how well your kidneys and liver are working. They can also show how well your blood clots. °· A physical exam will be done.   °· Only take medicines as directed by your health care provider. You may need to stop taking medicines (such as blood  thinners, aspirin, or nonsteroidal anti-inflammatory drugs) before the procedure.   °· Do not eat or drink at least 6 hours before the procedure or as directed by your health care provider. °· Arrange for a responsible adult, family member, or friend to take you home after the procedure. He or she should stay with you for at least 24 hours after the procedure, until the medicine has worn off. °PROCEDURE  °· An intravenous (IV) catheter will be inserted into one of your veins. Medicine will be able to flow directly into your body through this catheter. You may be given medicine through this tube to help prevent pain and help you relax. °· The medical or dental procedure will be done. °AFTER THE PROCEDURE °· You will stay in a recovery area until the medicine has worn off. Your blood pressure and pulse will be checked.   °·  Depending on the procedure you had, you may be allowed to go home when you can tolerate liquids and your pain is under control. °Document Released: 08/16/2001 Document Revised: 11/26/2013 Document Reviewed: 07/29/2013 °ExitCare® Patient Information ©2015 [employer]. This information is not intended to replace advice given to you by your health care provider. Make sure you discuss any questions you have with your health care provider. °Conscious Sedation, Adult, Care After °Refer to this sheet in the next few weeks. These instructions provide you with information on caring for yourself after your procedure. Your health care provider may also give you more specific instructions. Your treatment has been planned according to current medical practices, but problems sometimes occur. Call your health care provider if you have any problems or questions after your procedure. °WHAT TO EXPECT AFTER THE PROCEDURE  °After your procedure: °· You may feel sleepy, clumsy, and have poor balance for several hours. °· Vomiting may occur if you eat too soon after the procedure. °HOME CARE INSTRUCTIONS °· Do not  participate in any activities where you could become injured for at least 24 hours. Do not: °¨ Drive. °¨ Swim. °¨ Ride a bicycle. °¨ Operate heavy machinery. °¨ Cook. °¨ Use power tools. °¨ Climb ladders. °¨ Work from a high place. °· Do not make important decisions or sign legal documents until you are improved. °· If you vomit, drink water, juice, or soup when you can drink without vomiting. Make sure you have little   or no nausea before eating solid foods. °· Only take over-the-counter or prescription medicines for pain, discomfort, or fever as directed by your health care provider. °· Make sure you and your family fully understand everything about the medicines given to you, including what side effects may occur. °· You should not drink alcohol, take sleeping pills, or take medicines that cause drowsiness for at least 24 hours. °· If you smoke, do not smoke without supervision. °· If you are feeling better, you may resume normal activities 24 hours after you were sedated. °· Keep all appointments with your health care provider. °SEEK MEDICAL CARE IF: °· Your skin is pale or bluish in color. °· You continue to feel nauseous or vomit. °· Your pain is getting worse and is not helped by medicine. °· You have bleeding or swelling. °· You are still sleepy or feeling clumsy after 24 hours. °SEEK IMMEDIATE MEDICAL CARE IF: °· You develop a rash. °· You have difficulty breathing. °· You develop any type of allergic problem. °· You have a fever. °MAKE SURE YOU: °· Understand these instructions. °· Will watch your condition. °· Will get help right away if you are not doing well or get worse. °Document Released: 09/11/2013 Document Reviewed: 09/11/2013 °ExitCare® Patient Information ©2015 [employer]. This information is not intended to replace advice given to you by your health care provider. Make sure you discuss any questions you have with your health care provider. ° °

## 2015-08-20 ENCOUNTER — Other Ambulatory Visit: Payer: Medicare Other

## 2015-08-20 ENCOUNTER — Ambulatory Visit: Payer: Medicare Other

## 2015-08-20 ENCOUNTER — Ambulatory Visit (HOSPITAL_COMMUNITY)
Admission: RE | Admit: 2015-08-20 | Discharge: 2015-08-20 | Disposition: A | Discharge status: Home / Self Care | Payer: Medicare Other | Source: Ambulatory Visit | Attending: Hematology | Admitting: Hematology

## 2015-08-20 DIAGNOSIS — I700 Atherosclerosis of aorta: Secondary | ICD-10-CM | POA: Insufficient documentation

## 2015-08-20 DIAGNOSIS — R590 Localized enlarged lymph nodes: Secondary | ICD-10-CM | POA: Diagnosis not present

## 2015-08-20 DIAGNOSIS — C7951 Secondary malignant neoplasm of bone: Secondary | ICD-10-CM | POA: Diagnosis present

## 2015-08-20 DIAGNOSIS — C799 Secondary malignant neoplasm of unspecified site: Secondary | ICD-10-CM

## 2015-08-20 DIAGNOSIS — R918 Other nonspecific abnormal finding of lung field: Secondary | ICD-10-CM | POA: Diagnosis not present

## 2015-08-20 DIAGNOSIS — C801 Malignant (primary) neoplasm, unspecified: Secondary | ICD-10-CM | POA: Diagnosis not present

## 2015-08-20 MED ORDER — IOHEXOL 300 MG/ML  SOLN
100.0000 mL | Freq: Once | INTRAMUSCULAR | Status: AC | PRN
  Administered 2015-08-20: 100 mL via INTRAVENOUS

## 2015-08-21 ENCOUNTER — Telehealth: Payer: Self-pay | Admitting: *Deleted

## 2015-08-21 NOTE — Telephone Encounter (Signed)
Chemotherapy follow up: No complaints from patient at this time. Patient was instructed to call if she has any further issues. Patient verbalized understanding.

## 2015-08-25 ENCOUNTER — Other Ambulatory Visit: Payer: Self-pay | Admitting: *Deleted

## 2015-08-25 ENCOUNTER — Other Ambulatory Visit: Payer: Self-pay | Admitting: Hematology

## 2015-08-25 ENCOUNTER — Encounter (HOSPITAL_COMMUNITY): Payer: Self-pay

## 2015-08-25 ENCOUNTER — Telehealth: Payer: Self-pay | Admitting: Internal Medicine

## 2015-08-25 DIAGNOSIS — C3490 Malignant neoplasm of unspecified part of unspecified bronchus or lung: Secondary | ICD-10-CM

## 2015-08-25 NOTE — Telephone Encounter (Signed)
Call patient to discuss pathology results after endoscopy earlier this month Patient with carcinoma of unknown primary felt most likely to be non-small cell lung cancer currently undergoing chemotherapy and status post XRT Case discussed with Dr. Irene Limbo, her oncologist The pathology has been reviewed by Doctor Orene Desanctis, Doctor Rund, and that [facility] Barrett's esophagus with low-grade and high-grade dysplasia cannot exclude intramucosal adenocarcinoma I explained this to the patient at length today We discussed options including referral to [facility] Dr. Adria Devon to consider RFA versus observation given she is undergoing chemotherapy probable non-small cell lung cancer After discussing with patient and Dr. Irene Limbo, the patient decided that she would like to see how she responds to current chemotherapy and then meet with me in the office in November 2016 to discuss further At that time we can discuss repeating endoscopy with biopsy or referral for RFA/further treatment at [facility] I would like patient to continue omeprazole daily given her Barrett's esophagus Time provided for questions and answers, she thanked me for the call. I let her know she can call with any questions or concerns prior to follow-up  Vaughan Basta, please schedule office follow-up with me in early to mid November

## 2015-08-27 ENCOUNTER — Other Ambulatory Visit: Payer: Self-pay | Admitting: *Deleted

## 2015-08-27 ENCOUNTER — Encounter: Payer: Medicare Other | Admitting: Hematology

## 2015-08-27 ENCOUNTER — Other Ambulatory Visit (HOSPITAL_BASED_OUTPATIENT_CLINIC_OR_DEPARTMENT_OTHER): Payer: Medicare Other

## 2015-08-27 ENCOUNTER — Ambulatory Visit (HOSPITAL_BASED_OUTPATIENT_CLINIC_OR_DEPARTMENT_OTHER): Payer: Medicare Other

## 2015-08-27 VITALS — BP 110/60 | HR 87 | Temp 98.0°F | Resp 16

## 2015-08-27 DIAGNOSIS — Z79899 Other long term (current) drug therapy: Secondary | ICD-10-CM | POA: Diagnosis not present

## 2015-08-27 DIAGNOSIS — Z5112 Encounter for antineoplastic immunotherapy: Secondary | ICD-10-CM

## 2015-08-27 DIAGNOSIS — C7951 Secondary malignant neoplasm of bone: Secondary | ICD-10-CM

## 2015-08-27 DIAGNOSIS — C799 Secondary malignant neoplasm of unspecified site: Secondary | ICD-10-CM | POA: Diagnosis not present

## 2015-08-27 DIAGNOSIS — C801 Malignant (primary) neoplasm, unspecified: Secondary | ICD-10-CM

## 2015-08-27 DIAGNOSIS — C3490 Malignant neoplasm of unspecified part of unspecified bronchus or lung: Secondary | ICD-10-CM

## 2015-08-27 LAB — COMPREHENSIVE METABOLIC PANEL (CC13)
AST: 14 U/L (ref 5–34)
Albumin: 2.8 g/dL — ABNORMAL LOW (ref 3.5–5.0)
Alkaline Phosphatase: 91 U/L (ref 40–150)
Anion Gap: 8 mEq/L (ref 3–11)
BILIRUBIN TOTAL: 0.44 mg/dL (ref 0.20–1.20)
BUN: 16.2 mg/dL (ref 7.0–26.0)
CHLORIDE: 99 meq/L (ref 98–109)
CO2: 29 meq/L (ref 22–29)
CREATININE: 0.9 mg/dL (ref 0.6–1.1)
Calcium: 9 mg/dL (ref 8.4–10.4)
EGFR: 66 mL/min/{1.73_m2} — AB (ref >90)
GLUCOSE: 106 mg/dL (ref 70–140)
Potassium: 3.7 mEq/L (ref 3.5–5.1)
SODIUM: 136 meq/L (ref 136–145)
TOTAL PROTEIN: 7.2 g/dL (ref 6.4–8.3)

## 2015-08-27 LAB — CBC & DIFF AND RETIC
BASO%: 0.4 % (ref 0.0–2.0)
Basophils Absolute: 0 10*3/uL (ref 0.0–0.1)
EOS%: 1.8 % (ref 0.0–7.0)
Eosinophils Absolute: 0.1 10*3/uL (ref 0.0–0.5)
HCT: 39.5 % (ref 34.8–46.6)
HGB: 13 g/dL (ref 11.6–15.9)
IMMATURE RETIC FRACT: 6.3 % (ref 1.60–10.00)
LYMPH#: 1.1 10*3/uL (ref 0.9–3.3)
LYMPH%: 15 % (ref 14.0–49.7)
MCH: 28.7 pg (ref 25.1–34.0)
MCHC: 32.9 g/dL (ref 31.5–36.0)
MCV: 87.2 fL (ref 79.5–101.0)
MONO#: 0.6 10*3/uL (ref 0.1–0.9)
MONO%: 9 % (ref 0.0–14.0)
NEUT%: 73.8 % (ref 38.4–76.8)
NEUTROS ABS: 5.2 10*3/uL (ref 1.5–6.5)
Platelets: 272 10*3/uL (ref 145–400)
RBC: 4.53 10*6/uL (ref 3.70–5.45)
RDW: 14.6 % — AB (ref 11.2–14.5)
RETIC CT ABS: 73.39 10*3/uL (ref 33.70–90.70)
Retic %: 1.62 % (ref 0.70–2.10)
WBC: 7.1 10*3/uL (ref 3.9–10.3)

## 2015-08-27 LAB — TSH CHCC: TSH: 3.517 m(IU)/L (ref 0.308–3.960)

## 2015-08-27 MED ORDER — SODIUM CHLORIDE 0.9 % IJ SOLN
10.0000 mL | INTRAMUSCULAR | Status: DC | PRN
  Administered 2015-08-27: 10 mL
  Filled 2015-08-27: qty 10

## 2015-08-27 MED ORDER — DRONABINOL 2.5 MG PO CAPS: 2.5000 mg | ORAL_CAPSULE | Freq: Two times a day (BID) | ORAL | Status: DC

## 2015-08-27 MED ORDER — SODIUM CHLORIDE 0.9 % IV SOLN
Freq: Once | INTRAVENOUS | Status: AC
  Administered 2015-08-27: 16:00:00 via INTRAVENOUS

## 2015-08-27 MED ORDER — SODIUM CHLORIDE 0.9 % IV SOLN
3.2000 mg/kg | Freq: Once | INTRAVENOUS | Status: AC
  Administered 2015-08-27: 200 mg via INTRAVENOUS
  Filled 2015-08-27: qty 20

## 2015-08-27 MED ORDER — CITALOPRAM HYDROBROMIDE 20 MG PO TABS: 20.0000 mg | ORAL_TABLET | Freq: Every day | ORAL | Status: DC

## 2015-08-27 MED ORDER — HEPARIN SOD (PORK) LOCK FLUSH 100 UNIT/ML IV SOLN
500.0000 [IU] | Freq: Once | INTRAVENOUS | Status: AC | PRN
  Administered 2015-08-27: 500 [IU]
  Filled 2015-08-27: qty 5

## 2015-08-27 MED ORDER — DRONABINOL 5 MG PO CAPS: 5.0000 mg | ORAL_CAPSULE | Freq: Two times a day (BID) | ORAL | Status: DC

## 2015-08-27 MED ORDER — LIDOCAINE-PRILOCAINE 2.5-2.5 % EX CREA: TOPICAL_CREAM | CUTANEOUS | Status: DC

## 2015-08-27 NOTE — Telephone Encounter (Signed)
Pt scheduled to see Dr. [name] 10/27/15'@1'$ :45pm. Letter mailed to pt.

## 2015-08-27 NOTE — Patient Instructions (Signed)
Nivolumab injection What is this medicine? NIVOLUMAB (nye VOL ue mab) is used to treat certain types of melanoma and lung cancer. This medicine may be used for other purposes; ask your health care Blair Lundeen or pharmacist if you have questions. COMMON BRAND NAME(S): Opdivo What should I tell my health care Hamza Empson before I take this medicine? They need to know if you have any of these conditions: -eye disease, vision problems -history of pancreatitis -immune system problems -inflammatory bowel disease -kidney disease -liver disease -lung disease -lupus -myasthenia gravis -multiple sclerosis -organ transplant -stomach or intestine problems -thyroid disease -tingling of the fingers or toes, or other nerve disorder -an unusual or allergic reaction to nivolumab, other medicines, foods, dyes, or preservatives -pregnant or trying to get pregnant -breast-feeding How should I use this medicine? This medicine is for infusion into a vein. It is given by a health care professional in a hospital or clinic setting. A special MedGuide will be given to you before each treatment. Be sure to read this information carefully each time. Talk to your pediatrician regarding the use of this medicine in children. Special care may be needed. Overdosage: If you think you've taken too much of this medicine contact a poison control center or emergency room at once. Overdosage: If you think you have taken too much of this medicine contact a poison control center or emergency room at once. NOTE: This medicine is only for you. Do not share this medicine with others. What if I miss a dose? It is important not to miss your dose. Call your doctor or health care professional if you are unable to keep an appointment. What may interact with this medicine? Interactions have not been studied. This list may not describe all possible interactions. Give your health care Antoinette Haskett a list of all the medicines, herbs,  non-prescription drugs, or dietary supplements you use. Also tell them if you smoke, drink alcohol, or use illegal drugs. Some items may interact with your medicine. What should I watch for while using this medicine? Tell your doctor or healthcare professional if your symptoms do not start to get better or if they get worse. Your condition will be monitored carefully while you are receiving this medicine. You may need blood work done while you are taking this medicine. What side effects may I notice from receiving this medicine? Side effects that you should report to your doctor or health care professional as soon as possible: -allergic reactions like skin rash, itching or hives, swelling of the face, lips, or tongue -black, tarry stools -bloody or watery diarrhea -changes in vision -chills -cough -depressed mood -eye pain -feeling anxious -fever -general ill feeling or flu-like symptoms -hair loss -loss of appetite -low blood counts - this medicine may decrease the number of white blood cells, red blood cells and platelets. You may be at increased risk for infections and bleeding -pain, tingling, numbness in the hands or feet -redness, blistering, peeling or loosening of the skin, including inside the mouth -red pinpoint spots on skin -signs of decreased platelets or bleeding - bruising, pinpoint red spots on the skin, black, tarry stools, blood in the urine -signs of decreased red blood cells - unusually weak or tired, feeling faint or lightheaded, falls -signs of infection - fever or chills, cough, sore throat, pain or trouble passing urine -signs and symptoms of a dangerous change in heartbeat or heart rhythm like chest pain; dizziness; fast or irregular heartbeat; palpitations; feeling faint or lightheaded, falls; breathing problems -signs   and symptoms of high blood sugar such as dizziness; dry mouth; dry skin; fruity breath; nausea; stomach pain; increased hunger or thirst; increased  urination -signs and symptoms of kidney injury like trouble passing urine or change in the amount of urine -signs and symptoms of liver injury like dark yellow or brown urine; general ill feeling or flu-like symptoms; light-colored stools; loss of appetite; nausea; right upper belly pain; unusually weak or tired; yellowing of the eyes or skin -signs and symptoms of increased potassium like muscle weakness; chest pain; or fast, irregular heartbeat -signs and symptoms of low potassium like muscle cramps or muscle pain; chest pain; dizziness; feeling faint or lightheaded, falls; palpitations; breathing problems; or fast, irregular heartbeat -swelling of the ankles, feet, hands -weight gainSide effects that usually do not require medical attention (report to your doctor or health care professional if they continue or are bothersome): -constipation -general ill feeling or flu-like symptoms -hair loss -loss of appetite -nausea, vomiting This list may not describe all possible side effects. Call your doctor for medical advice about side effects. You may report side effects to FDA at 1-800-FDA-1088. Where should I keep my medicine? This drug is given in a hospital or clinic and will not be stored at home. NOTE: This sheet is a summary. It may not cover all possible information. If you have questions about this medicine, talk to your doctor, pharmacist, or health care Teleah Villamar.  2015, [employer]. (2014-02-10 13:18:19)  

## 2015-08-28 LAB — SEDIMENTATION RATE: Sed Rate: 60 mm/hr — ABNORMAL HIGH (ref 0–30)

## 2015-08-28 LAB — C-REACTIVE PROTEIN: CRP: 5.4 mg/dL — AB (ref <0.60)

## 2015-08-28 LAB — CORTISOL: CORTISOL PLASMA: 13.5 ug/dL

## 2015-09-02 ENCOUNTER — Other Ambulatory Visit: Payer: Self-pay | Admitting: *Deleted

## 2015-09-03 ENCOUNTER — Other Ambulatory Visit (HOSPITAL_BASED_OUTPATIENT_CLINIC_OR_DEPARTMENT_OTHER): Payer: Medicare Other

## 2015-09-03 ENCOUNTER — Telehealth: Payer: Self-pay | Admitting: Hematology

## 2015-09-03 ENCOUNTER — Ambulatory Visit (HOSPITAL_COMMUNITY): Payer: Medicare Other

## 2015-09-03 ENCOUNTER — Encounter: Payer: Self-pay | Admitting: Radiation Oncology

## 2015-09-03 ENCOUNTER — Ambulatory Visit: Payer: Medicare Other

## 2015-09-03 ENCOUNTER — Other Ambulatory Visit: Payer: Self-pay | Admitting: *Deleted

## 2015-09-03 ENCOUNTER — Ambulatory Visit (HOSPITAL_BASED_OUTPATIENT_CLINIC_OR_DEPARTMENT_OTHER): Payer: Medicare Other | Admitting: Hematology

## 2015-09-03 ENCOUNTER — Ambulatory Visit
Admission: RE | Admit: 2015-09-03 | Discharge: 2015-09-03 | Disposition: A | Discharge status: Home / Self Care | Payer: Medicare Other | Source: Ambulatory Visit | Attending: Radiation Oncology | Admitting: Radiation Oncology

## 2015-09-03 ENCOUNTER — Encounter: Payer: Self-pay | Admitting: Hematology

## 2015-09-03 VITALS — BP 118/65 | HR 79 | Temp 98.1°F | Resp 18 | Ht 68.0 in | Wt 127.7 lb

## 2015-09-03 VITALS — BP 122/64 | HR 78 | Temp 97.7°F

## 2015-09-03 DIAGNOSIS — C799 Secondary malignant neoplasm of unspecified site: Secondary | ICD-10-CM

## 2015-09-03 DIAGNOSIS — R630 Anorexia: Secondary | ICD-10-CM | POA: Diagnosis not present

## 2015-09-03 DIAGNOSIS — K2270 Barrett's esophagus without dysplasia: Secondary | ICD-10-CM | POA: Diagnosis not present

## 2015-09-03 DIAGNOSIS — F329 Major depressive disorder, single episode, unspecified: Secondary | ICD-10-CM

## 2015-09-03 DIAGNOSIS — C3490 Malignant neoplasm of unspecified part of unspecified bronchus or lung: Secondary | ICD-10-CM

## 2015-09-03 DIAGNOSIS — F32A Depression, unspecified: Secondary | ICD-10-CM

## 2015-09-03 LAB — CBC WITH DIFFERENTIAL/PLATELET
BASO%: 1.5 % (ref 0.0–2.0)
BASOS ABS: 0.1 10*3/uL (ref 0.0–0.1)
EOS ABS: 0.1 10*3/uL (ref 0.0–0.5)
EOS%: 2 % (ref 0.0–7.0)
HCT: 37.6 % (ref 34.8–46.6)
HGB: 12.2 g/dL (ref 11.6–15.9)
LYMPH%: 13.5 % — AB (ref 14.0–49.7)
MCH: 27.8 pg (ref 25.1–34.0)
MCHC: 32.6 g/dL (ref 31.5–36.0)
MCV: 85.3 fL (ref 79.5–101.0)
MONO#: 0.5 10*3/uL (ref 0.1–0.9)
MONO%: 9 % (ref 0.0–14.0)
NEUT#: 4.4 10*3/uL (ref 1.5–6.5)
NEUT%: 74 % (ref 38.4–76.8)
Platelets: 285 10*3/uL (ref 145–400)
RBC: 4.41 10*6/uL (ref 3.70–5.45)
RDW: 15.5 % — ABNORMAL HIGH (ref 11.2–14.5)
WBC: 6 10*3/uL (ref 3.9–10.3)
lymph#: 0.8 10*3/uL — ABNORMAL LOW (ref 0.9–3.3)

## 2015-09-03 LAB — COMPREHENSIVE METABOLIC PANEL (CC13)
ALK PHOS: 87 U/L (ref 40–150)
ALT: <9 U/L (ref 0–55)
ANION GAP: 5 meq/L (ref 3–11)
AST: 13 U/L (ref 5–34)
Albumin: 2.6 g/dL — ABNORMAL LOW (ref 3.5–5.0)
BILIRUBIN TOTAL: 0.34 mg/dL (ref 0.20–1.20)
BUN: 9.8 mg/dL (ref 7.0–26.0)
CHLORIDE: 106 meq/L (ref 98–109)
CO2: 27 mEq/L (ref 22–29)
CREATININE: 0.8 mg/dL (ref 0.6–1.1)
Calcium: 8.6 mg/dL (ref 8.4–10.4)
EGFR: 72 mL/min/{1.73_m2} — AB (ref >90)
Glucose: 101 mg/dl (ref 70–140)
POTASSIUM: 4.5 meq/L (ref 3.5–5.1)
SODIUM: 138 meq/L (ref 136–145)
Total Protein: 6.8 g/dL (ref 6.4–8.3)

## 2015-09-03 NOTE — Progress Notes (Signed)
Radiation Oncology         [phone] ________________________________  Name: Cindy Byrd MRN: [id number]  Date: 09/03/2015  DOB: March 16, 1941    Follow-Up Visit Note  CC: Tamsen Roers, MD  [name], MD  Diagnosis:      ICD-9-CM ICD-10-CM   1. Bone neoplasm 239.2 D49.2       ICD-9-CM ICD-10-CM   1. Metastatic cancer 199.1 C80.1     Interval Since Last Radiation:  1  months  Narrative:  The patient returns today for routine follow-up of radiation to Left hip bone metastasis. She reports pain when ambulating and uses a walker. She reports moderate fatigue and naps during the day at times. She denies shortness of breath with normal activity. She still takes pain medications, though not as much as previous.                              ALLERGIES:  is allergic to penicillins and latex.  Meds: Current Outpatient Prescriptions  Medication Sig Dispense Refill  . citalopram (CELEXA) 20 MG tablet Take 1 tablet (20 mg total) by mouth daily. 30 tablet 2  . docusate sodium (COLACE) 100 MG capsule Take 100 mg by mouth daily as needed for mild constipation.    [name] dronabinol (MARINOL) 5 MG capsule Take 1 capsule (5 mg total) by mouth 2 (two) times daily before a meal. 60 capsule 0  . lidocaine-prilocaine (EMLA) cream Apply small amount over port 1-2 hours prior to treatment, cover with plastic wrap (DO NOT RUB IN). 30 g prn  . omeprazole (PRILOSEC OTC) 20 MG tablet Take 20 mg by mouth every morning.     [name] oxyCODONE (OXY IR/ROXICODONE) 5 MG immediate release tablet Take 1 tablet (5 mg total) by mouth every 4 (four) hours as needed for severe pain. 60 tablet 0  . OxyCODONE (OXYCONTIN) 10 mg T12A 12 hr tablet Take 1 tablet (10 mg total) by mouth every 12 (twelve) hours. 60 tablet 0  . PRESCRIPTION MEDICATION Antibody Plan [facility]     No current facility-administered medications for this encounter.    Physical Findings: The patient is in no acute distress. Patient is alert and  oriented.  temperature is 97.7 F (36.5 C). Her blood pressure is 122/64 and her pulse is 78. Her oxygen saturation is 95%. .  No significant changes.  Lab Findings: Lab Results  Component Value Date   WBC 6.0 09/03/2015   WBC 6.8 08/18/2015   HGB 12.2 09/03/2015   HGB 12.8 08/18/2015   HCT 37.6 09/03/2015   HCT 39.5 08/18/2015   PLT 285 09/03/2015   PLT 245 08/18/2015    Lab Results  Component Value Date   NA 138 09/03/2015   NA 135 08/18/2015   K 4.5 09/03/2015   K 3.4* 08/18/2015   CHLORIDE 106 09/03/2015   CO2 27 09/03/2015   CO2 28 08/18/2015   GLUCOSE 101 09/03/2015   GLUCOSE 105* 08/18/2015   BUN 9.8 09/03/2015   BUN 17 08/18/2015   CREATININE 0.8 09/03/2015   CREATININE 0.94 08/18/2015   BILITOT 0.34 09/03/2015   ALKPHOS 87 09/03/2015   AST 13 09/03/2015   ALT <9 09/03/2015   PROT 6.8 09/03/2015   ALBUMIN 2.6* 09/03/2015   CALCIUM 8.6 09/03/2015   CALCIUM 8.7* 08/18/2015   ANIONGAP 5 09/03/2015   ANIONGAP 12 08/18/2015    Radiographic Findings: Ct Chest W Contrast  08/21/2015  CLINICAL DATA:  Metastatic carcinoma involving the bone. Unknown primary.  EXAM: CT CHEST, ABDOMEN, AND PELVIS WITH CONTRAST  TECHNIQUE: Multidetector CT imaging of the chest, abdomen and pelvis was performed following the standard protocol during bolus administration of intravenous contrast.  CONTRAST:  126m OMNIPAQUE IOHEXOL 300 MG/ML  SOLN  COMPARISON:  PET-CT 07/01/2015  FINDINGS: CT CHEST FINDINGS  Mediastinum: The heart size is normal. There is aortic atherosclerosis. Trachea is patent and is midline. Normal appearance of the esophagus. Index right paratracheal lymph node measures 7 mm, image 16 of series 2. Previously 1.2 cm. Index right paratracheal lymph node Measures 1.4 cm, image 26 of series 2. Previously 1.6 cm. No new or progressive adenopathy identified within the chest.  Lungs/Pleura: There is no pleural effusion identified. There is no airspace consolidation. Spiculated,  nodular density in the left lower lobe measures 1.5 cm, image 32/ series 5. Unchanged from previous exam. Adjacent perifissural nodule measure 6 mm, image 30/series 5. Unchanged from previous exam. 6 mm right lower lobe lung nodule is identified and appears unchanged new nonspecific subpleural passed [place] within the posterior right lower lobe is identified, image 39 of series 5. Adjacent scarring noted. There is a perifissural nodule in the right middle lobe measuring 5 mm, image 36/series 5.  Musculoskeletal: No aggressive lytic or sclerotic bone lesions.  CT ABDOMEN AND PELVIS FINDINGS  Hepatobiliary: Low-attenuation structures within the liver are again noted. These are compatible with cysts and appear unchanged. No suspicious liver abnormality identified. The gallbladder appears normal. No biliary dilatation.  Pancreas: Unremarkable appearance of the pancreas.  Spleen: The spleen is negative.  Adrenals/Urinary Tract: The adrenal glands are negative. Normal appearance of the kidneys. The urinary bladder appears within normal limits.  Stomach/Bowel: The stomach is within normal limits. The small bowel loops have a normal course and caliber. No obstruction. Normal appearance of the colon. Mild increase caliber of the appendix which measures 7 mm on today's study. No periappendiceal fat stranding or free fluid.  Vascular/Lymphatic: Calcified atherosclerotic disease involves the abdominal aorta. No aneurysm. No enlarged retroperitoneal or mesenteric adenopathy. No enlarged pelvic or inguinal lymph nodes. Index left common iliac node measures 8 mm, image 88 of series 2. Previously 9 mm.  Reproductive: Previous hysterectomy.  No adnexal mass identified.  Other: No free fluid or fluid collections identified.  Musculoskeletal: Large expansile mass involving the left iliac bone measures 11.7 x 6.8 cm, image 91 of series 2. Previously this measured 9.6 x 4.2 cm.  IMPRESSION: 1. There has been significant increase in size  of the large, expansile, metastatic lesion involving the left iliac bone with significant of bony destruction. 2. Stable appearance of scattered pulmonary nodules. 3. Mild decrease in size of previous mediastinal adenopathy. 4. Pulmonary nodules are stable.   Electronically Signed   By: TKerby MoorsM.D.   On: 08/21/2015 08:45   Ct Abdomen Pelvis W Contrast  08/21/2015   CLINICAL DATA:  Metastatic carcinoma involving the bone. Unknown primary.  EXAM: CT CHEST, ABDOMEN, AND PELVIS WITH CONTRAST  TECHNIQUE: Multidetector CT imaging of the chest, abdomen and pelvis was performed following the standard protocol during bolus administration of intravenous contrast.  CONTRAST:  1038mOMNIPAQUE IOHEXOL 300 MG/ML  SOLN  COMPARISON:  PET-CT 07/01/2015  FINDINGS: CT CHEST FINDINGS  Mediastinum: The heart size is normal. There is aortic atherosclerosis. Trachea is patent and is midline. Normal appearance of the esophagus. Index right paratracheal lymph node measures 7 mm, image 16 of series 2.  Previously 1.2 cm. Index right paratracheal lymph node Measures 1.4 cm, image 26 of series 2. Previously 1.6 cm. No new or progressive adenopathy identified within the chest.  Lungs/Pleura: There is no pleural effusion identified. There is no airspace consolidation. Spiculated, nodular density in the left lower lobe measures 1.5 cm, image 32/ series 5. Unchanged from previous exam. Adjacent perifissural nodule measure 6 mm, image 30/series 5. Unchanged from previous exam. 6 mm right lower lobe lung nodule is identified and appears unchanged new nonspecific subpleural passed [place] within the posterior right lower lobe is identified, image 39 of series 5. Adjacent scarring noted. There is a perifissural nodule in the right middle lobe measuring 5 mm, image 36/series 5.  Musculoskeletal: No aggressive lytic or sclerotic bone lesions.  CT ABDOMEN AND PELVIS FINDINGS  Hepatobiliary: Low-attenuation structures within the liver are again  noted. These are compatible with cysts and appear unchanged. No suspicious liver abnormality identified. The gallbladder appears normal. No biliary dilatation.  Pancreas: Unremarkable appearance of the pancreas.  Spleen: The spleen is negative.  Adrenals/Urinary Tract: The adrenal glands are negative. Normal appearance of the kidneys. The urinary bladder appears within normal limits.  Stomach/Bowel: The stomach is within normal limits. The small bowel loops have a normal course and caliber. No obstruction. Normal appearance of the colon. Mild increase caliber of the appendix which measures 7 mm on today's study. No periappendiceal fat stranding or free fluid.  Vascular/Lymphatic: Calcified atherosclerotic disease involves the abdominal aorta. No aneurysm. No enlarged retroperitoneal or mesenteric adenopathy. No enlarged pelvic or inguinal lymph nodes. Index left common iliac node measures 8 mm, image 88 of series 2. Previously 9 mm.  Reproductive: Previous hysterectomy.  No adnexal mass identified.  Other: No free fluid or fluid collections identified.  Musculoskeletal: Large expansile mass involving the left iliac bone measures 11.7 x 6.8 cm, image 91 of series 2. Previously this measured 9.6 x 4.2 cm.  IMPRESSION: 1. There has been significant increase in size of the large, expansile, metastatic lesion involving the left iliac bone with significant of bony destruction. 2. Stable appearance of scattered pulmonary nodules. 3. Mild decrease in size of previous mediastinal adenopathy. 4. Pulmonary nodules are stable.   Electronically Signed   By: Kerby Moors M.D.   On: 08/21/2015 08:45   Ir Fluoro Guide Cv Line Right  08/18/2015   CLINICAL DATA:  74 year old female with a metastatic lung cancer in need of durable central venous access for chemotherapy.  EXAM: IR RIGHT FLOURO GUIDE CV LINE; IR ULTRASOUND GUIDANCE VASC ACCESS RIGHT  Date: 08/18/2015  ANESTHESIA/SEDATION: Moderate (conscious) sedation was  administered during this procedure. A total of 3 mg Versed and 50 mg Fentanyl were administered intravenously. The patient's vital signs were monitored continuously by radiology nursing throughout the course of the procedure.  Total sedation time: 16 minutes  FLUOROSCOPY TIME:  12 seconds  1 mGy  TECHNIQUE: The right neck and chest was prepped with chlorhexidine, and draped in the usual sterile fashion using maximum barrier technique (cap and mask, sterile gown, sterile gloves, large sterile sheet, hand hygiene and cutaneous antiseptic). Antibiotic prophylaxis was provided with 1g vancomycin administered IV one hour prior to skin incision. Local anesthesia was attained by infiltration with 1% lidocaine with epinephrine.  Ultrasound demonstrated patency of the right internal jugular vein, and this was documented with an image. Under real-time ultrasound guidance, this vein was accessed with a 21 gauge micropuncture needle and image documentation was performed. A small dermatotomy was  made at the access site with an 11 scalpel. A 0.018" wire was advanced into the SVC and the access needle exchanged for a 22F micropuncture vascular sheath. The 0.018" wire was then removed and a 0.035" wire advanced into the IVC.  An appropriate location for the subcutaneous reservoir was selected below the clavicle and an incision was made through the skin and underlying soft tissues. The subcutaneous tissues were then dissected using a combination of blunt and sharp surgical technique and a pocket was formed. A single lumen power injectable portacatheter was then tunneled through the subcutaneous tissues from the pocket to the dermatotomy and the port reservoir placed within the subcutaneous pocket.  The venous access site was then serially dilated and a peel away vascular sheath placed over the wire. The wire was removed and the port catheter advanced into position under fluoroscopic guidance. The catheter tip is positioned in the  upper right atrium. This was documented with a spot image. The portacatheter was then tested and found to flush and aspirate well. The port was flushed with saline followed by 100 units/mL heparinized saline.  The pocket was then closed in two layers using first subdermal inverted interrupted absorbable sutures followed by a running subcuticular suture. The epidermis was then sealed with Dermabond. The dermatotomy at the venous access site was also closed with a single inverted subdermal suture and the epidermis sealed with Dermabond.  COMPLICATIONS: None.  The patient tolerated the procedure well.  IMPRESSION: Successful placement of a right IJ approach Power Port with ultrasound and fluoroscopic guidance. The catheter is ready for use.  Signed,  Criselda Peaches, MD  [facility]  [facility]   Electronically Signed   By: Jacqulynn Cadet M.D.   On: 08/18/2015 16:19   Ir US Guide Vasc Access Right  08/18/2015   CLINICAL DATA:  74 year old female with a metastatic lung cancer in need of durable central venous access for chemotherapy.  EXAM: IR RIGHT FLOURO GUIDE CV LINE; IR ULTRASOUND GUIDANCE VASC ACCESS RIGHT  Date: 08/18/2015  ANESTHESIA/SEDATION: Moderate (conscious) sedation was administered during this procedure. A total of 3 mg Versed and 50 mg Fentanyl were administered intravenously. The patient's vital signs were monitored continuously by radiology nursing throughout the course of the procedure.  Total sedation time: 16 minutes  FLUOROSCOPY TIME:  12 seconds  1 mGy  TECHNIQUE: The right neck and chest was prepped with chlorhexidine, and draped in the usual sterile fashion using maximum barrier technique (cap and mask, sterile gown, sterile gloves, large sterile sheet, hand hygiene and cutaneous antiseptic). Antibiotic prophylaxis was provided with 1g vancomycin administered IV one hour prior to skin incision. Local anesthesia was attained by infiltration  with 1% lidocaine with epinephrine.  Ultrasound demonstrated patency of the right internal jugular vein, and this was documented with an image. Under real-time ultrasound guidance, this vein was accessed with a 21 gauge micropuncture needle and image documentation was performed. A small dermatotomy was made at the access site with an 11 scalpel. A 0.018" wire was advanced into the SVC and the access needle exchanged for a 22F micropuncture vascular sheath. The 0.018" wire was then removed and a 0.035" wire advanced into the IVC.  An appropriate location for the subcutaneous reservoir was selected below the clavicle and an incision was made through the skin and underlying soft tissues. The subcutaneous tissues were then dissected using a combination of blunt and sharp surgical technique and a pocket was formed. A single  lumen power injectable portacatheter was then tunneled through the subcutaneous tissues from the pocket to the dermatotomy and the port reservoir placed within the subcutaneous pocket.  The venous access site was then serially dilated and a peel away vascular sheath placed over the wire. The wire was removed and the port catheter advanced into position under fluoroscopic guidance. The catheter tip is positioned in the upper right atrium. This was documented with a spot image. The portacatheter was then tested and found to flush and aspirate well. The port was flushed with saline followed by 100 units/mL heparinized saline.  The pocket was then closed in two layers using first subdermal inverted interrupted absorbable sutures followed by a running subcuticular suture. The epidermis was then sealed with Dermabond. The dermatotomy at the venous access site was also closed with a single inverted subdermal suture and the epidermis sealed with Dermabond.  COMPLICATIONS: None.  The patient tolerated the procedure well.  IMPRESSION: Successful placement of a right IJ approach Power Port with ultrasound and  fluoroscopic guidance. The catheter is ready for use.  Signed,  Criselda Peaches, MD  [facility]  [facility]   Electronically Signed   By: Jacqulynn Cadet M.D.   On: 08/18/2015 16:19    Impression:  Patient achieved a good response to palliative radiation.  Her pain has improved.  It is not clear whether the larger size of the met on CT represents progressive disease, since her symptoms are so much better.  The enlargement could reflect post-CT pre-XRT growth.  Plan:  She will continue to receive immunotherapy and follow up with medical oncology accordingly. She will follow up in radiation oncology as needed.  This document serves as a record of services personally performed by Tyler Pita, MD. It was created on his behalf by Arlyce Harman, a trained medical scribe. The creation of this record is based on the scribe's personal observations and the Britni Driscoll's statements to them. This document has been checked and approved by the attending Lerry Cordrey.    _____________________________________  Sheral Apley. Tammi Klippel, M.D.

## 2015-09-03 NOTE — Progress Notes (Addendum)
Cindy Byrd presents for follow- up of radiation to Left hip bone metastasis. She reports pain when ambulating and uses a walker but states this is better since radiation finished. She reports moderate fatigue and naps during the day at times. She denies shortness of breath with normal activity. She reports normal bowel activity with the use of colace. She reports a decreased appetite, forcing herself to eat at times. Low blood pressure resolved primary care has taken her off all blood pressure medicine. BP 122/64 mmHg  Pulse 78  Temp(Src) 97.7 F (36.5 C)  SpO2 95%  Wt Readings from Last 3 Encounters:  09/03/15 127 lb 11.2 oz (57.924 kg)  08/11/15 139 lb (63.05 kg)  08/06/15 138 lb 11.2 oz (62.914 kg)

## 2015-09-03 NOTE — Telephone Encounter (Signed)
per pof to sch pt appt-pt to get updated sch 10/6

## 2015-09-04 DIAGNOSIS — K2270 Barrett's esophagus without dysplasia: Secondary | ICD-10-CM | POA: Insufficient documentation

## 2015-09-04 NOTE — Progress Notes (Signed)
[name]    HEMATOLOGY ONCOLOGY PROGRESS NOTE  Date of service: 08/06/2015  Patient Care Team: Tamsen Roers, MD as PCP - General (Family Medicine) [name], MD as Consulting Physician (Hematology and Oncology)  CHIEF COMPLAINTS/PURPOSE OF CONSULTATION: Follow-up for metastatic malignancy with likely lung primary.  HISTORY OF PRESENTING ILLNESS: (plz see my previous consultation for details of initial presentation)  INTERVAL HISTORY  Ms Matson he is here with her husband for follow-up.  She notes her left hip pain is stable. Has been ambulating around the house a little bit. Still notes some anorexia though it's a little better in the last 5-6 days and she feels the Marinol might be useful. Has no other acute new focal symptoms. His following up with radiation oncology today as well. No new skin rashes. No mucositis. No diarrhea. Encouraged to improve oral intake to try to maintain body weight. Notes that the increased dose of Celexa has helped emotionally.   MEDICAL HISTORY:  Past Medical History  Diagnosis Date  . IBS (irritable bowel syndrome)   . Bone neoplasm 06/24/2015  . GERD (gastroesophageal reflux disease)   . Vitamin D deficiency disease   . Cigarette smoker two packs a day or less     Currently still smoking 2 PPD - Not interested in quitting at this time.  . Colon polyps     hyperplastic, tubular adenomas, tubulovillous adenoma  . Endometriosis     Hysterectomy with BSO at age 74 yrs  . H/O: pneumonia   . Heavy smoker (more than 20 cigarettes per day) 06/24/2015  . Depression 06/24/2015  . Hypertension 06/24/2015    likely improved incidental to 40 lbs weight loss from her neoplasm. No Longer taking med for this as of 08-06-15  . Cancer     metastatic poorly differentiated carcinoma. tumor left groin surgical removal with radiation tx.  . Cough, persistent     hx. lung cancer ? primary-being evaluated, unsure of primary site.  . Pain     left hip-persistent"tumor  of bone"-radiation tx. 10.  . Swelling of ankle     bilateral   SURGICAL HISTORY: Past Surgical History  Procedure Laterality Date  . Abdominal hysterectomy    . Tonsillectomy    . Ganglion cyst excision    . Total abdominal hysterectomy w/ bilateral salpingoophorectomy  at age 74    For endometriosis  . Knee arthroscopy  age about 74  . Bartholin gland cyst excision  74 yo ago    Does not want if it was an infected cyst or tumor. Was soon as delivery  . Colonoscopy w/ polypectomy      multiple times - last done 09/2014 per patient.  . Esophagogastroduodenoscopy (egd) with propofol N/A 08/11/2015    Procedure: ESOPHAGOGASTRODUODENOSCOPY (EGD) WITH PROPOFOL;  Surgeon: [name], MD;  Location: [facility];  Service: Gastroenterology;  Laterality: N/A;    SOCIAL HISTORY: Social History   Social History  . Marital Status: Widowed    Spouse Name: N/A  . Number of Children: 2  . Years of Education: N/A   Occupational History  . Not on file.   Social History Main Topics  . Smoking status: Current Every Day Smoker -- 1.00 packs/day for 60 years    Types: Cigarettes  . Smokeless tobacco: Never Used  . Alcohol Use: Yes     Comment: occassional   . Drug Use: No  . Sexual Activity: No   Other Topics Concern  . Not  on file   Social History Narrative    FAMILY HISTORY: Family History  Problem Relation Age of Onset  . Stroke Mother   . Colon cancer Father   . Colon cancer Brother   . Colon cancer Brother   . Breast cancer Daughter 13    ER/PR+ stage II    ALLERGIES:  is allergic to penicillins and latex. patient wonders if she has a penicillin allergy but notes that she is uncertain about this.  MEDICATIONS:  Current Outpatient Prescriptions  Medication Sig Dispense Refill  . citalopram (CELEXA) 20 MG tablet Take 1 tablet (20 mg total) by mouth daily. 30 tablet 2  . docusate sodium (COLACE) 100 MG capsule Take 100 mg by mouth daily as needed for mild  constipation.    [name] dronabinol (MARINOL) 5 MG capsule Take 1 capsule (5 mg total) by mouth 2 (two) times daily before a meal. 60 capsule 0  . lidocaine-prilocaine (EMLA) cream Apply small amount over port 1-2 hours prior to treatment, cover with plastic wrap (DO NOT RUB IN). 30 g prn  . omeprazole (PRILOSEC OTC) 20 MG tablet Take 20 mg by mouth every morning.     [name] oxyCODONE (OXY IR/ROXICODONE) 5 MG immediate release tablet Take 1 tablet (5 mg total) by mouth every 4 (four) hours as needed for severe pain. 60 tablet 0  . OxyCODONE (OXYCONTIN) 10 mg T12A 12 hr tablet Take 1 tablet (10 mg total) by mouth every 12 (twelve) hours. 60 tablet 0  . PRESCRIPTION MEDICATION Antibody Plan [facility]     No current facility-administered medications for this visit.    REVIEW OF SYSTEMS:   Constitutional: Denies fevers, chills or abnormal night sweats Eyes: Denies blurriness of vision, double vision or watery eyes Ears, nose, mouth, throat, and face: Denies mucositis or sore throat Respiratory: Denies cough, dyspnea or wheezes Cardiovascular: Denies palpitation, chest discomfort or lower extremity swelling Gastrointestinal:  Denies nausea, heartburn or change in bowel habits Skin: Denies abnormal skin rashes Lymphatics: Denies new lymphadenopathy or easy bruising Neurological:Denies numbness, tingling or new weaknesses Behavioral/Psych: Mildly depressed affect ,Mood is stable no suicidal or homicidal ideation   All other systems were reviewed with the patient and are negative.   PHYSICAL EXAMINATION: ECOG PERFORMANCE STATUS: 2 - Symptomatic, <50% confined to bed  Filed Vitals:   09/03/15 1316  BP: 118/65  Pulse: 79  Temp: 98.1 F (36.7 C)  Resp: 18   Filed Weights   09/03/15 1316  Weight: 127 lb 11.2 oz (57.924 kg)   GENERAL:alert, pain well controlled. SKIN: skin color, texture, turgor are normal, no rashes or significant lesions EYES: normal, conjunctiva are pink and non-injected, sclera  clear OROPHARYNX:no exudate, no erythema and lips, buccal mucosa, and tongue normal  NECK: supple, thyroid normal size, non-tender, without nodularity LYMPH:  no palpable lymphadenopathy in the cervical, axillary or inguinal LUNGS: clear to auscultation and bilateral reduced breath sounds with normal respiratory effort  HEART: regular rate & rhythm and no murmurs and resolved lower extremity edema ABDOMEN:abdomen soft, non-tender and normal bowel sounds Musculoskeletal: No pedal edema. No calf pain or tenderness.  PSYCH: alert & oriented x 3 with fluent speech NEURO: no focal motor/sensory deficits.  LABORATORY DATA:  I have reviewed the data as listed Lab Results  Component Value Date   WBC 6.0 09/03/2015   HGB 12.2 09/03/2015   HCT 37.6 09/03/2015   MCV 85.3 09/03/2015   PLT 285 09/03/2015    Recent Labs  06/11/15 2252  08/14/15 0828 08/18/15 1305 08/27/15 1413 09/03/15 1258  NA 138  < > 138 135 136 138  K 3.4*  < > 3.2* 3.4* 3.7 4.5  CL 102  --   --  95*  --   --   CO2 29  < > 30* _0 GLUCOSE 103*  < > 101 105* 106 101  BUN 19  < > 15.3 17 16.2 9.8  CREATININE 0.87  < > 0.9 0.94 0.9 0.8  CALCIUM 9.6  < > 9.8 8.7* 9.0 8.6  GFRNONAA >60  --   --  58*  --   --   GFRAA >60  --   --  >60  --   --   PROT  --   < > 7.1  --  7.2 6.8  ALBUMIN  --   < > 2.9*  --  2.8* 2.6*  AST  --   < > 13  --  14 13  ALT  --   < > 8  --  <9 <9  ALKPHOS  --   < > 86  --  91 87  BILITOT  --   < > 0.86  --  0.44 0.34  < > = values in this interval not displayed.  RADIOGRAPHIC STUDIES: I have personally reviewed the radiological images as listed and agreed with the findings in the report. Ct Chest W Contrast  08/21/2015   CLINICAL DATA:  Metastatic carcinoma involving the bone. Unknown primary.  EXAM: CT CHEST, ABDOMEN, AND PELVIS WITH CONTRAST  TECHNIQUE: Multidetector CT imaging of the chest, abdomen and pelvis was performed following the standard protocol during bolus administration  of intravenous contrast.  CONTRAST:  152m OMNIPAQUE IOHEXOL 300 MG/ML  SOLN  COMPARISON:  PET-CT 07/01/2015  FINDINGS: CT CHEST FINDINGS  Mediastinum: The heart size is normal. There is aortic atherosclerosis. Trachea is patent and is midline. Normal appearance of the esophagus. Index right paratracheal lymph node measures 7 mm, image 16 of series 2. Previously 1.2 cm. Index right paratracheal lymph node Measures 1.4 cm, image 26 of series 2. Previously 1.6 cm. No new or progressive adenopathy identified within the chest.  Lungs/Pleura: There is no pleural effusion identified. There is no airspace consolidation. Spiculated, nodular density in the left lower lobe measures 1.5 cm, image 32/ series 5. Unchanged from previous exam. Adjacent perifissural nodule measure 6 mm, image 30/series 5. Unchanged from previous exam. 6 mm right lower lobe lung nodule is identified and appears unchanged new nonspecific subpleural passed [place] within the posterior right lower lobe is identified, image 39 of series 5. Adjacent scarring noted. There is a perifissural nodule in the right middle lobe measuring 5 mm, image 36/series 5.  Musculoskeletal: No aggressive lytic or sclerotic bone lesions.  CT ABDOMEN AND PELVIS FINDINGS  Hepatobiliary: Low-attenuation structures within the liver are again noted. These are compatible with cysts and appear unchanged. No suspicious liver abnormality identified. The gallbladder appears normal. No biliary dilatation.  Pancreas: Unremarkable appearance of the pancreas.  Spleen: The spleen is negative.  Adrenals/Urinary Tract: The adrenal glands are negative. Normal appearance of the kidneys. The urinary bladder appears within normal limits.  Stomach/Bowel: The stomach is within normal limits. The small bowel loops have a normal course and caliber. No obstruction. Normal appearance of the colon. Mild increase caliber of the appendix which measures 7 mm on today's study. No periappendiceal fat  stranding or free fluid.  Vascular/Lymphatic: Calcified atherosclerotic disease involves the abdominal  aorta. No aneurysm. No enlarged retroperitoneal or mesenteric adenopathy. No enlarged pelvic or inguinal lymph nodes. Index left common iliac node measures 8 mm, image 88 of series 2. Previously 9 mm.  Reproductive: Previous hysterectomy.  No adnexal mass identified.  Other: No free fluid or fluid collections identified.  Musculoskeletal: Large expansile mass involving the left iliac bone measures 11.7 x 6.8 cm, image 91 of series 2. Previously this measured 9.6 x 4.2 cm.  IMPRESSION: 1. There has been significant increase in size of the large, expansile, metastatic lesion involving the left iliac bone with significant of bony destruction. 2. Stable appearance of scattered pulmonary nodules. 3. Mild decrease in size of previous mediastinal adenopathy. 4. Pulmonary nodules are stable.   Electronically Signed   By: Kerby Moors M.D.   On: 08/21/2015 08:45   Ct Abdomen Pelvis W Contrast  08/21/2015   CLINICAL DATA:  Metastatic carcinoma involving the bone. Unknown primary.  EXAM: CT CHEST, ABDOMEN, AND PELVIS WITH CONTRAST  TECHNIQUE: Multidetector CT imaging of the chest, abdomen and pelvis was performed following the standard protocol during bolus administration of intravenous contrast.  CONTRAST:  121m OMNIPAQUE IOHEXOL 300 MG/ML  SOLN  COMPARISON:  PET-CT 07/01/2015  FINDINGS: CT CHEST FINDINGS  Mediastinum: The heart size is normal. There is aortic atherosclerosis. Trachea is patent and is midline. Normal appearance of the esophagus. Index right paratracheal lymph node measures 7 mm, image 16 of series 2. Previously 1.2 cm. Index right paratracheal lymph node Measures 1.4 cm, image 26 of series 2. Previously 1.6 cm. No new or progressive adenopathy identified within the chest.  Lungs/Pleura: There is no pleural effusion identified. There is no airspace consolidation. Spiculated, nodular density in the left  lower lobe measures 1.5 cm, image 32/ series 5. Unchanged from previous exam. Adjacent perifissural nodule measure 6 mm, image 30/series 5. Unchanged from previous exam. 6 mm right lower lobe lung nodule is identified and appears unchanged new nonspecific subpleural passed [place] within the posterior right lower lobe is identified, image 39 of series 5. Adjacent scarring noted. There is a perifissural nodule in the right middle lobe measuring 5 mm, image 36/series 5.  Musculoskeletal: No aggressive lytic or sclerotic bone lesions.  CT ABDOMEN AND PELVIS FINDINGS  Hepatobiliary: Low-attenuation structures within the liver are again noted. These are compatible with cysts and appear unchanged. No suspicious liver abnormality identified. The gallbladder appears normal. No biliary dilatation.  Pancreas: Unremarkable appearance of the pancreas.  Spleen: The spleen is negative.  Adrenals/Urinary Tract: The adrenal glands are negative. Normal appearance of the kidneys. The urinary bladder appears within normal limits.  Stomach/Bowel: The stomach is within normal limits. The small bowel loops have a normal course and caliber. No obstruction. Normal appearance of the colon. Mild increase caliber of the appendix which measures 7 mm on today's study. No periappendiceal fat stranding or free fluid.  Vascular/Lymphatic: Calcified atherosclerotic disease involves the abdominal aorta. No aneurysm. No enlarged retroperitoneal or mesenteric adenopathy. No enlarged pelvic or inguinal lymph nodes. Index left common iliac node measures 8 mm, image 88 of series 2. Previously 9 mm.  Reproductive: Previous hysterectomy.  No adnexal mass identified.  Other: No free fluid or fluid collections identified.  Musculoskeletal: Large expansile mass involving the left iliac bone measures 11.7 x 6.8 cm, image 91 of series 2. Previously this measured 9.6 x 4.2 cm.  IMPRESSION: 1. There has been significant increase in size of the large, expansile,  metastatic lesion involving the left iliac  bone with significant of bony destruction. 2. Stable appearance of scattered pulmonary nodules. 3. Mild decrease in size of previous mediastinal adenopathy. 4. Pulmonary nodules are stable.   Electronically Signed   By: Kerby Moors M.D.   On: 08/21/2015 08:45   Ir Fluoro Guide Cv Line Right  08/18/2015   CLINICAL DATA:  74 year old female with a metastatic lung cancer in need of durable central venous access for chemotherapy.  EXAM: IR RIGHT FLOURO GUIDE CV LINE; IR ULTRASOUND GUIDANCE VASC ACCESS RIGHT  Date: 08/18/2015  ANESTHESIA/SEDATION: Moderate (conscious) sedation was administered during this procedure. A total of 3 mg Versed and 50 mg Fentanyl were administered intravenously. The patient's vital signs were monitored continuously by radiology nursing throughout the course of the procedure.  Total sedation time: 16 minutes  FLUOROSCOPY TIME:  12 seconds  1 mGy  TECHNIQUE: The right neck and chest was prepped with chlorhexidine, and draped in the usual sterile fashion using maximum barrier technique (cap and mask, sterile gown, sterile gloves, large sterile sheet, hand hygiene and cutaneous antiseptic). Antibiotic prophylaxis was provided with 1g vancomycin administered IV one hour prior to skin incision. Local anesthesia was attained by infiltration with 1% lidocaine with epinephrine.  Ultrasound demonstrated patency of the right internal jugular vein, and this was documented with an image. Under real-time ultrasound guidance, this vein was accessed with a 21 gauge micropuncture needle and image documentation was performed. A small dermatotomy was made at the access site with an 11 scalpel. A 0.018" wire was advanced into the SVC and the access needle exchanged for a 78F micropuncture vascular sheath. The 0.018" wire was then removed and a 0.035" wire advanced into the IVC.  An appropriate location for the subcutaneous reservoir was selected below the clavicle  and an incision was made through the skin and underlying soft tissues. The subcutaneous tissues were then dissected using a combination of blunt and sharp surgical technique and a pocket was formed. A single lumen power injectable portacatheter was then tunneled through the subcutaneous tissues from the pocket to the dermatotomy and the port reservoir placed within the subcutaneous pocket.  The venous access site was then serially dilated and a peel away vascular sheath placed over the wire. The wire was removed and the port catheter advanced into position under fluoroscopic guidance. The catheter tip is positioned in the upper right atrium. This was documented with a spot image. The portacatheter was then tested and found to flush and aspirate well. The port was flushed with saline followed by 100 units/mL heparinized saline.  The pocket was then closed in two layers using first subdermal inverted interrupted absorbable sutures followed by a running subcuticular suture. The epidermis was then sealed with Dermabond. The dermatotomy at the venous access site was also closed with a single inverted subdermal suture and the epidermis sealed with Dermabond.  COMPLICATIONS: None.  The patient tolerated the procedure well.  IMPRESSION: Successful placement of a right IJ approach Power Port with ultrasound and fluoroscopic guidance. The catheter is ready for use.  Signed,  Criselda Peaches, MD  [facility]  [facility]   Electronically Signed   By: Jacqulynn Cadet M.D.   On: 08/18/2015 16:19   Ir US Guide Vasc Access Right  08/18/2015   CLINICAL DATA:  74 year old female with a metastatic lung cancer in need of durable central venous access for chemotherapy.  EXAM: IR RIGHT FLOURO GUIDE CV LINE; IR ULTRASOUND GUIDANCE VASC ACCESS RIGHT  Date:  08/18/2015  ANESTHESIA/SEDATION: Moderate (conscious) sedation was administered during this procedure. A total of 3 mg Versed and  50 mg Fentanyl were administered intravenously. The patient's vital signs were monitored continuously by radiology nursing throughout the course of the procedure.  Total sedation time: 16 minutes  FLUOROSCOPY TIME:  12 seconds  1 mGy  TECHNIQUE: The right neck and chest was prepped with chlorhexidine, and draped in the usual sterile fashion using maximum barrier technique (cap and mask, sterile gown, sterile gloves, large sterile sheet, hand hygiene and cutaneous antiseptic). Antibiotic prophylaxis was provided with 1g vancomycin administered IV one hour prior to skin incision. Local anesthesia was attained by infiltration with 1% lidocaine with epinephrine.  Ultrasound demonstrated patency of the right internal jugular vein, and this was documented with an image. Under real-time ultrasound guidance, this vein was accessed with a 21 gauge micropuncture needle and image documentation was performed. A small dermatotomy was made at the access site with an 11 scalpel. A 0.018" wire was advanced into the SVC and the access needle exchanged for a 56F micropuncture vascular sheath. The 0.018" wire was then removed and a 0.035" wire advanced into the IVC.  An appropriate location for the subcutaneous reservoir was selected below the clavicle and an incision was made through the skin and underlying soft tissues. The subcutaneous tissues were then dissected using a combination of blunt and sharp surgical technique and a pocket was formed. A single lumen power injectable portacatheter was then tunneled through the subcutaneous tissues from the pocket to the dermatotomy and the port reservoir placed within the subcutaneous pocket.  The venous access site was then serially dilated and a peel away vascular sheath placed over the wire. The wire was removed and the port catheter advanced into position under fluoroscopic guidance. The catheter tip is positioned in the upper right atrium. This was documented with a spot image. The  portacatheter was then tested and found to flush and aspirate well. The port was flushed with saline followed by 100 units/mL heparinized saline.  The pocket was then closed in two layers using first subdermal inverted interrupted absorbable sutures followed by a running subcuticular suture. The epidermis was then sealed with Dermabond. The dermatotomy at the venous access site was also closed with a single inverted subdermal suture and the epidermis sealed with Dermabond.  COMPLICATIONS: None.  The patient tolerated the procedure well.  IMPRESSION: Successful placement of a right IJ approach Power Port with ultrasound and fluoroscopic guidance. The catheter is ready for use.  Signed,  Criselda Peaches, MD  [facility]  [facility]   Electronically Signed   By: Jacqulynn Cadet M.D.   On: 08/18/2015 16:19    ASSESSMENT & PLAN:   #1 Metastatic poorly differentiated carcinoma with likely lung primary [non-small cell lung cancer].  Less likely would possibly upper GI. CT of the head with and without contrast showed no evidence of metastatic disease. Patient notes much improved pain control. EGFR blood test mutation analysis negative. Patient's pain is much better controlled after radiation for the painful ilium met. Overall much better since the time of diagnosis but still struggling with some poor appetite which had previously improved for a bit with steroids.  CT chest abdomen pelvis on 08/20/2015 showed improvement in the mediastinal adenopathy, stable lung nodules and some increase in the size of her left ilial lesion.  The increase in the left ilial lesion does not appear to be a true progression since the  patient's clinical symptoms have much improved after XRT it might suggest a post CT pre XRT increase in size or pseudo-progression due to tumor infiltrating immune cells.  Patient is currently status post XRT to her left ilial lesion for palliative  control of her symptoms. She has received 2 cycles of Nivolumab and so it is too soon to evaluate the response to this.  Plan -Continue current pain regimen with bowel prophylaxis-Follow-up with GI for her EGD as scheduled on 08/11/2015. - rediscussed smoking cessation.  -continue Nivolumab q2weeks third cycle on 09/10/2015. Delton See q4weeks  - we will see patient back prior to the fourth cycle of Nivolumab.  #2 Anxiety and depression related to her life-threatening diagnosis of cancer . Patient did not tolerate Remeron due to nightmares   symptoms improved with increase in the dose of Celexa to 20 mg daily   plan -Continue on Celexa 20 mg by mouth daily  -Counseled the patient to remain as physically active as possible   #3 Barrett's esophagus 4cms in the distal esophagus with low and high-grade dysplasia cannot rule out an early intra-mucosal esophageal adenocarcinoma.   Plan -Patient being monitored by Dr. [name] from GI. -She is planning to follow her up in 2 months to see look at the lesion and if that is significant change in the lesion and her metastatic lung cancer is under control he was planning to potentially refer her to [facility] for RF of her esophageal lesions.  All questions were answered. The patient knows to call the clinic with any problems, questions or concerns.  I spent 20 minutes counseling the patient face to face, discussing her new treatments and potential adverse effects and answering all the patients questions. The total time spent in the appointment was 25 minutes and more than 50% was on counseling.   Sullivan Lone MD [place] H[facility]/Oncology Physician [facility]  (Office):       [phone] (Work cell):  [phone] (Fax):           [phone]

## 2015-09-09 ENCOUNTER — Other Ambulatory Visit: Payer: Self-pay | Admitting: *Deleted

## 2015-09-09 DIAGNOSIS — C3490 Malignant neoplasm of unspecified part of unspecified bronchus or lung: Secondary | ICD-10-CM

## 2015-09-09 NOTE — Progress Notes (Signed)
This encounter was created in error - please disregard.

## 2015-09-10 ENCOUNTER — Ambulatory Visit (HOSPITAL_BASED_OUTPATIENT_CLINIC_OR_DEPARTMENT_OTHER): Payer: Medicare Other

## 2015-09-10 ENCOUNTER — Ambulatory Visit: Payer: Medicare Other

## 2015-09-10 VITALS — BP 133/65 | HR 63 | Temp 97.1°F

## 2015-09-10 DIAGNOSIS — Z5112 Encounter for antineoplastic immunotherapy: Secondary | ICD-10-CM | POA: Diagnosis not present

## 2015-09-10 DIAGNOSIS — C3490 Malignant neoplasm of unspecified part of unspecified bronchus or lung: Secondary | ICD-10-CM | POA: Diagnosis not present

## 2015-09-10 DIAGNOSIS — C7951 Secondary malignant neoplasm of bone: Secondary | ICD-10-CM | POA: Diagnosis not present

## 2015-09-10 MED ORDER — HEPARIN SOD (PORK) LOCK FLUSH 100 UNIT/ML IV SOLN
500.0000 [IU] | Freq: Once | INTRAVENOUS | Status: AC | PRN
  Administered 2015-09-10: 500 [IU]
  Filled 2015-09-10: qty 5

## 2015-09-10 MED ORDER — SODIUM CHLORIDE 0.9 % IJ SOLN
10.0000 mL | INTRAMUSCULAR | Status: DC | PRN
  Administered 2015-09-10: 10 mL
  Filled 2015-09-10: qty 10

## 2015-09-10 MED ORDER — SODIUM CHLORIDE 0.9 % IV SOLN
Freq: Once | INTRAVENOUS | Status: AC
  Administered 2015-09-10: 16:00:00 via INTRAVENOUS

## 2015-09-10 MED ORDER — SODIUM CHLORIDE 0.9 % IV SOLN
2.8000 mg/kg | Freq: Once | INTRAVENOUS | Status: AC
  Administered 2015-09-10: 180 mg via INTRAVENOUS
  Filled 2015-09-10: qty 8

## 2015-09-10 MED ORDER — DENOSUMAB 120 MG/1.7ML ~~LOC~~ SOLN
120.0000 mg | Freq: Once | SUBCUTANEOUS | Status: AC
  Administered 2015-09-10: 120 mg via SUBCUTANEOUS
  Filled 2015-09-10: qty 1.7

## 2015-09-10 NOTE — Patient Instructions (Signed)
[facility] Discharge Instructions for Patients Receiving Chemotherapy  Today you received the following chemotherapy agents:  Nivolumab.  To help prevent nausea and vomiting after your treatment, we encourage you to take your nausea medication as directed.   If you develop nausea and vomiting that is not controlled by your nausea medication, call the clinic.   BELOW ARE SYMPTOMS THAT SHOULD BE REPORTED IMMEDIATELY:  *FEVER GREATER THAN 100.5 F  *CHILLS WITH OR WITHOUT FEVER  NAUSEA AND VOMITING THAT IS NOT CONTROLLED WITH YOUR NAUSEA MEDICATION  *UNUSUAL SHORTNESS OF BREATH  *UNUSUAL BRUISING OR BLEEDING  TENDERNESS IN MOUTH AND THROAT WITH OR WITHOUT PRESENCE OF ULCERS  *URINARY PROBLEMS  *BOWEL PROBLEMS  UNUSUAL RASH Items with * indicate a potential emergency and should be followed up as soon as possible.  Feel free to call the clinic you have any questions or concerns. The clinic phone number is [phone].  Please show the CHEMO ALERT CARD at check-in to the Emergency Department and triage nurse.   

## 2015-09-10 NOTE — Progress Notes (Signed)
xgeva to be given in infusion, pt aware, RN aware

## 2015-09-12 ENCOUNTER — Other Ambulatory Visit: Payer: Self-pay | Admitting: Hematology

## 2015-09-13 ENCOUNTER — Other Ambulatory Visit: Payer: Self-pay | Admitting: Hematology

## 2015-09-24 ENCOUNTER — Encounter: Payer: Self-pay | Admitting: Hematology

## 2015-09-24 ENCOUNTER — Ambulatory Visit (HOSPITAL_BASED_OUTPATIENT_CLINIC_OR_DEPARTMENT_OTHER): Payer: Medicare Other

## 2015-09-24 ENCOUNTER — Other Ambulatory Visit (HOSPITAL_BASED_OUTPATIENT_CLINIC_OR_DEPARTMENT_OTHER): Payer: Medicare Other

## 2015-09-24 ENCOUNTER — Ambulatory Visit (HOSPITAL_BASED_OUTPATIENT_CLINIC_OR_DEPARTMENT_OTHER): Payer: Medicare Other | Admitting: Hematology

## 2015-09-24 VITALS — BP 118/60 | HR 78 | Temp 97.6°F | Resp 18 | Ht 68.0 in | Wt 124.5 lb

## 2015-09-24 DIAGNOSIS — G893 Neoplasm related pain (acute) (chronic): Secondary | ICD-10-CM

## 2015-09-24 DIAGNOSIS — K22711 Barrett's esophagus with high grade dysplasia: Secondary | ICD-10-CM

## 2015-09-24 DIAGNOSIS — C3490 Malignant neoplasm of unspecified part of unspecified bronchus or lung: Secondary | ICD-10-CM

## 2015-09-24 DIAGNOSIS — Z5112 Encounter for antineoplastic immunotherapy: Secondary | ICD-10-CM

## 2015-09-24 DIAGNOSIS — E46 Unspecified protein-calorie malnutrition: Secondary | ICD-10-CM | POA: Diagnosis not present

## 2015-09-24 DIAGNOSIS — F329 Major depressive disorder, single episode, unspecified: Secondary | ICD-10-CM | POA: Diagnosis not present

## 2015-09-24 DIAGNOSIS — F419 Anxiety disorder, unspecified: Secondary | ICD-10-CM

## 2015-09-24 LAB — COMPREHENSIVE METABOLIC PANEL (CC13)
ALT: <9 U/L (ref 0–55)
AST: 15 U/L (ref 5–34)
Albumin: 3 g/dL — ABNORMAL LOW (ref 3.5–5.0)
Alkaline Phosphatase: 74 U/L (ref 40–150)
Anion Gap: 6 mEq/L (ref 3–11)
BUN: 11.2 mg/dL (ref 7.0–26.0)
CO2: 24 meq/L (ref 22–29)
Calcium: 9.4 mg/dL (ref 8.4–10.4)
Chloride: 110 mEq/L — ABNORMAL HIGH (ref 98–109)
Creatinine: 0.8 mg/dL (ref 0.6–1.1)
EGFR: 78 mL/min/{1.73_m2} — AB (ref >90)
GLUCOSE: 102 mg/dL (ref 70–140)
POTASSIUM: 4.2 meq/L (ref 3.5–5.1)
SODIUM: 140 meq/L (ref 136–145)
TOTAL PROTEIN: 7.3 g/dL (ref 6.4–8.3)

## 2015-09-24 LAB — CBC & DIFF AND RETIC
BASO%: 1.2 % (ref 0.0–2.0)
Basophils Absolute: 0.1 10*3/uL (ref 0.0–0.1)
EOS%: 2 % (ref 0.0–7.0)
Eosinophils Absolute: 0.1 10*3/uL (ref 0.0–0.5)
HCT: 38.9 % (ref 34.8–46.6)
HGB: 12.4 g/dL (ref 11.6–15.9)
IMMATURE RETIC FRACT: 3.5 % (ref 1.60–10.00)
LYMPH#: 0.9 10*3/uL (ref 0.9–3.3)
LYMPH%: 17 % (ref 14.0–49.7)
MCH: 28.1 pg (ref 25.1–34.0)
MCHC: 31.9 g/dL (ref 31.5–36.0)
MCV: 88 fL (ref 79.5–101.0)
MONO#: 0.4 10*3/uL (ref 0.1–0.9)
MONO%: 7.5 % (ref 0.0–14.0)
NEUT%: 72.3 % (ref 38.4–76.8)
NEUTROS ABS: 3.7 10*3/uL (ref 1.5–6.5)
Platelets: 238 10*3/uL (ref 145–400)
RBC: 4.42 10*6/uL (ref 3.70–5.45)
RDW: 15.1 % — AB (ref 11.2–14.5)
RETIC %: 1.05 % (ref 0.70–2.10)
Retic Ct Abs: 46.41 10*3/uL (ref 33.70–90.70)
WBC: 5.1 10*3/uL (ref 3.9–10.3)

## 2015-09-24 MED ORDER — SODIUM CHLORIDE 0.9 % IV SOLN
240.0000 mg | Freq: Once | INTRAVENOUS | Status: AC
  Administered 2015-09-24: 240 mg via INTRAVENOUS
  Filled 2015-09-24: qty 8

## 2015-09-24 MED ORDER — SODIUM CHLORIDE 0.9 % IV SOLN
Freq: Once | INTRAVENOUS | Status: AC
  Administered 2015-09-24: 15:00:00 via INTRAVENOUS

## 2015-09-24 MED ORDER — OXYCODONE HCL 5 MG PO TABS: 5.0000 mg | ORAL_TABLET | ORAL | Status: DC | PRN

## 2015-09-24 MED ORDER — OXYCODONE HCL ER 10 MG PO T12A: 10.0000 mg | EXTENDED_RELEASE_TABLET | Freq: Two times a day (BID) | ORAL | Status: DC

## 2015-09-24 MED ORDER — SENNOSIDES-DOCUSATE SODIUM 8.6-50 MG PO TABS: 2.0000 | ORAL_TABLET | Freq: Every day | ORAL | Status: DC

## 2015-09-24 MED ORDER — SODIUM CHLORIDE 0.9 % IJ SOLN
10.0000 mL | INTRAMUSCULAR | Status: DC | PRN
  Filled 2015-09-24: qty 10

## 2015-09-24 MED ORDER — HEPARIN SOD (PORK) LOCK FLUSH 100 UNIT/ML IV SOLN
500.0000 [IU] | Freq: Once | INTRAVENOUS | Status: AC | PRN
Start: 2015-09-24 — End: 2015-09-24
  Administered 2015-09-24: 500 [IU]
  Filled 2015-09-24: qty 5

## 2015-09-24 NOTE — Patient Instructions (Signed)
[facility] Discharge Instructions for Patients Receiving Chemotherapy  Today you received the following chemotherapy agents:  Nivolumab.  To help prevent nausea and vomiting after your treatment, we encourage you to take your nausea medication as directed.   If you develop nausea and vomiting that is not controlled by your nausea medication, call the clinic.   BELOW ARE SYMPTOMS THAT SHOULD BE REPORTED IMMEDIATELY:  *FEVER GREATER THAN 100.5 F  *CHILLS WITH OR WITHOUT FEVER  NAUSEA AND VOMITING THAT IS NOT CONTROLLED WITH YOUR NAUSEA MEDICATION  *UNUSUAL SHORTNESS OF BREATH  *UNUSUAL BRUISING OR BLEEDING  TENDERNESS IN MOUTH AND THROAT WITH OR WITHOUT PRESENCE OF ULCERS  *URINARY PROBLEMS  *BOWEL PROBLEMS  UNUSUAL RASH Items with * indicate a potential emergency and should be followed up as soon as possible.  Feel free to call the clinic you have any questions or concerns. The clinic phone number is [phone].  Please show the CHEMO ALERT CARD at check-in to the Emergency Department and triage nurse.   

## 2015-09-25 ENCOUNTER — Telehealth: Payer: Self-pay | Admitting: Hematology

## 2015-09-25 ENCOUNTER — Other Ambulatory Visit: Payer: Self-pay | Admitting: Hematology

## 2015-09-25 NOTE — Telephone Encounter (Signed)
per pof to sch pt appt-cld & spoke to pt and gave appt time & date

## 2015-09-27 NOTE — Addendum Note (Signed)
Encounter addended by: Tyler Pita, MD on: 09/27/2015  6:45 PM<BR>     Documentation filed: Notes Section, Visit Diagnoses, Problem List

## 2015-09-27 NOTE — Progress Notes (Signed)
  Radiation Oncology         [phone]   Name: SHALISSA EASTERWOOD MRN: [id number]   Date: 07/20/2015  DOB: 1941-07-23   Weekly Radiation Therapy Management    ICD-9-CM ICD-10-CM   1. Bone metastasis (HCC) 198.5 C79.51     Current Dose: 12 Gy  Planned Dose:  30 Gy  Narrative The patient presents for routine under treatment assessment. Weight and vitals stable. Reports pain is much less since starting radiation therapy. Reports since being prescribed long acting pain medication she is having to take less prn pain medication. Reports appetite has improved with the aid of decadron.  Edema noted in both lower extremities. Encouraged patient to elevate feet above the level of her heart. Understand edema of feet could be related to decadron..  The patient is without complaint. Set-up films were reviewed. The chart was checked.  Physical Findings  weight is 138 lb 1.6 oz (62.642 kg). Her blood pressure is 123/48 and her pulse is 53. Her respiration is 16 and oxygen saturation is 100%. . Weight essentially stable.  No significant changes.  Impression The patient is tolerating radiation.  Plan Continue treatment as planned.         Sheral Apley Tammi Klippel, M.D.

## 2015-09-30 ENCOUNTER — Encounter: Payer: Self-pay | Admitting: Skilled Nursing Facility1

## 2015-09-30 NOTE — Progress Notes (Signed)
Subjective:     Patient ID: Cindy Byrd, female   DOB: [birth year], 74 y.o.   MRN: [id number]  HPI   Review of Systems     Objective:   Physical Exam To assist the pt in identifying some dietary strategies to gain lost wt back.    Assessment:     Pt identified as being malnourished due to losing some wt. Pt contacted via the telephone at [phone]. Pt states she has lost wt but she does not know how much. Pt states she does not have a good appetite Pt states she has been having a problem with diarrhea. Pt states she has been drinking 2-3 ensures a day.      Plan:     Dietitian will contact Ernestene Kiel CSO,RD,LDN to send the pt information on a decreased appetite and diarrhea management. Dietitian also advised the pt to set an alarrm to remind her to attempt to eat something every three hours. Dietitian also offered the option of quick prepared meals such as [employer]ki [employer] evans sides and frozen entrees.

## 2015-10-05 ENCOUNTER — Encounter: Payer: Self-pay | Admitting: *Deleted

## 2015-10-07 ENCOUNTER — Telehealth: Payer: Self-pay | Admitting: *Deleted

## 2015-10-07 ENCOUNTER — Ambulatory Visit (HOSPITAL_BASED_OUTPATIENT_CLINIC_OR_DEPARTMENT_OTHER): Payer: Medicare Other | Admitting: Hematology

## 2015-10-07 ENCOUNTER — Ambulatory Visit (HOSPITAL_BASED_OUTPATIENT_CLINIC_OR_DEPARTMENT_OTHER): Payer: Medicare Other

## 2015-10-07 ENCOUNTER — Encounter: Payer: Self-pay | Admitting: Hematology

## 2015-10-07 VITALS — BP 127/71 | HR 94 | Temp 97.9°F | Resp 17 | Ht 68.0 in | Wt 124.6 lb

## 2015-10-07 DIAGNOSIS — F329 Major depressive disorder, single episode, unspecified: Secondary | ICD-10-CM | POA: Diagnosis not present

## 2015-10-07 DIAGNOSIS — G893 Neoplasm related pain (acute) (chronic): Secondary | ICD-10-CM

## 2015-10-07 DIAGNOSIS — F419 Anxiety disorder, unspecified: Secondary | ICD-10-CM

## 2015-10-07 DIAGNOSIS — C3490 Malignant neoplasm of unspecified part of unspecified bronchus or lung: Secondary | ICD-10-CM

## 2015-10-07 DIAGNOSIS — K22711 Barrett's esophagus with high grade dysplasia: Secondary | ICD-10-CM

## 2015-10-07 LAB — COMPREHENSIVE METABOLIC PANEL (CC13)
ALBUMIN: 3.4 g/dL — AB (ref 3.5–5.0)
ALK PHOS: 88 U/L (ref 40–150)
ALT: 10 U/L (ref 0–55)
ANION GAP: 6 meq/L (ref 3–11)
AST: 17 U/L (ref 5–34)
BUN: 12.1 mg/dL (ref 7.0–26.0)
CALCIUM: 9.7 mg/dL (ref 8.4–10.4)
CO2: 28 mEq/L (ref 22–29)
CREATININE: 0.7 mg/dL (ref 0.6–1.1)
Chloride: 105 mEq/L (ref 98–109)
EGFR: 82 mL/min/{1.73_m2} — ABNORMAL LOW (ref >90)
Glucose: 89 mg/dl (ref 70–140)
Potassium: 4.6 mEq/L (ref 3.5–5.1)
SODIUM: 138 meq/L (ref 136–145)
TOTAL PROTEIN: 7.8 g/dL (ref 6.4–8.3)

## 2015-10-07 LAB — CBC & DIFF AND RETIC
BASO%: 1.5 % (ref 0.0–2.0)
Basophils Absolute: 0.1 10*3/uL (ref 0.0–0.1)
EOS%: 1.9 % (ref 0.0–7.0)
Eosinophils Absolute: 0.1 10*3/uL (ref 0.0–0.5)
HCT: 41.4 % (ref 34.8–46.6)
HGB: 13.2 g/dL (ref 11.6–15.9)
IMMATURE RETIC FRACT: 4.4 % (ref 1.60–10.00)
LYMPH#: 0.9 10*3/uL (ref 0.9–3.3)
LYMPH%: 16.9 % (ref 14.0–49.7)
MCH: 28 pg (ref 25.1–34.0)
MCHC: 31.9 g/dL (ref 31.5–36.0)
MCV: 87.9 fL (ref 79.5–101.0)
MONO#: 0.6 10*3/uL (ref 0.1–0.9)
MONO%: 10.9 % (ref 0.0–14.0)
NEUT%: 68.8 % (ref 38.4–76.8)
NEUTROS ABS: 3.7 10*3/uL (ref 1.5–6.5)
PLATELETS: 218 10*3/uL (ref 145–400)
RBC: 4.71 10*6/uL (ref 3.70–5.45)
RDW: 15.6 % — ABNORMAL HIGH (ref 11.2–14.5)
Retic %: 1.13 % (ref 0.70–2.10)
Retic Ct Abs: 53.22 10*3/uL (ref 33.70–90.70)
WBC: 5.3 10*3/uL (ref 3.9–10.3)

## 2015-10-07 NOTE — Telephone Encounter (Signed)
Per staff message and POF I have scheduled appts. Advised scheduler of appts. JMW  

## 2015-10-07 NOTE — Progress Notes (Signed)
[name]    HEMATOLOGY ONCOLOGY PROGRESS NOTE  Date of service: .10/07/2015   Patient Care Team: Tamsen Roers, MD as PCP - General (Family Medicine) [name], MD as Consulting Physician (Hematology and Oncology)  CHIEF COMPLAINTS/PURPOSE OF CONSULTATION: Follow-up for metastatic malignancy with likely lung primary.  HISTORY OF PRESENTING ILLNESS: (plz see my previous consultation for details of initial presentation)  INTERVAL HISTORY  Cindy Byrd he is here with her husband for her scheduled follow-up prior to her fifth cycle of Nivolumab. She appears to be in good spirits today. Notes that she has been walking around and doing a little bit more at home. Good mood. No acute new concerns. Notes that she is interested in seeing what is happening on her scans. We discussed about getting repeat scans prior to her next cycle of treatment. No new bone pains. Left hip pain is well-controlled. No skin rashes, nausea vomiting diarrhea.  MEDICAL HISTORY:  Past Medical History  Diagnosis Date  . IBS (irritable bowel syndrome)   . Bone neoplasm 06/24/2015  . GERD (gastroesophageal reflux disease)   . Vitamin D deficiency disease   . Cigarette smoker two packs a day or less     Currently still smoking 2 PPD - Not interested in quitting at this time.  . Colon polyps     hyperplastic, tubular adenomas, tubulovillous adenoma  . Endometriosis     Hysterectomy with BSO at age 21 yrs  . H/O: pneumonia   . Heavy smoker (more than 20 cigarettes per day) 06/24/2015  . Depression 06/24/2015  . Hypertension 06/24/2015    likely improved incidental to 40 lbs weight loss from her neoplasm. No Longer taking med for this as of 08-06-15  . Cancer [facility])     metastatic poorly differentiated carcinoma. tumor left groin surgical removal with radiation tx.  . Cough, persistent     hx. lung cancer ? primary-being evaluated, unsure of primary site.  . Pain     left hip-persistent"tumor of bone"-radiation tx. 10.   . Swelling of ankle     bilateral  . Pulmonary nodules   . Barrett's esophagus   . Hiatal hernia   . Gastritis   . Esophageal adenocarcinoma ([place]) 08/11/15    intramucosal  . Diverticulosis    SURGICAL HISTORY: Past Surgical History  Procedure Laterality Date  . Abdominal hysterectomy    . Tonsillectomy    . Ganglion cyst excision    . Total abdominal hysterectomy w/ bilateral salpingoophorectomy  at age 52 yrs    For endometriosis  . Knee arthroscopy  age about 8 yrs  . Bartholin gland cyst excision  74 yo ago    Does not want if it was an infected cyst or tumor. Was soon as delivery  . Colonoscopy w/ polypectomy      multiple times - last done 09/2014 per patient.  . Esophagogastroduodenoscopy (egd) with propofol N/A 08/11/2015    Procedure: ESOPHAGOGASTRODUODENOSCOPY (EGD) WITH PROPOFOL;  Surgeon: [name], MD;  Location: [facility] ENDOSCOPY;  Service: Gastroenterology;  Laterality: N/A;    SOCIAL HISTORY: Social History   Social History  . Marital Status: Widowed    Spouse Name: N/A  . Number of Children: 2  . Years of Education: N/A   Occupational History  . Not on file.   Social History Main Topics  . Smoking status: Current Every Day Smoker -- 1.00 packs/day for 60 years    Types: Cigarettes  . Smokeless tobacco: Never Used  .  Alcohol Use: Yes     Comment: occassional   . Drug Use: No  . Sexual Activity: No   Other Topics Concern  . Not on file   Social History Narrative    FAMILY HISTORY: Family History  Problem Relation Age of Onset  . Stroke Mother   . Colon cancer Father   . Colon cancer Brother   . Colon cancer Brother   . Breast cancer Daughter 23    ER/PR+ stage II    ALLERGIES:  is allergic to penicillins and latex. patient wonders if she has a penicillin allergy but notes that she is uncertain about this.  MEDICATIONS:  Current Outpatient Prescriptions  Medication Sig Dispense Refill  . amLODipine (NORVASC) 5 MG tablet     .  citalopram (CELEXA) 20 MG tablet Take 1 tablet (20 mg total) by mouth daily. 30 tablet 2  . docusate sodium (COLACE) 100 MG capsule Take 100 mg by mouth daily as needed for mild constipation.    [name] dronabinol (MARINOL) 5 MG capsule Take 1 capsule (5 mg total) by mouth 2 (two) times daily before a meal. 60 capsule 0  . lidocaine-prilocaine (EMLA) cream Apply small amount over port 1-2 hours prior to treatment, cover with plastic wrap (DO NOT RUB IN). 30 g prn  . omeprazole (PRILOSEC OTC) 20 MG tablet Take 20 mg by mouth every morning.     [name] oxyCODONE (OXY IR/ROXICODONE) 5 MG immediate release tablet Take 1 tablet (5 mg total) by mouth every 4 (four) hours as needed for severe pain. 60 tablet 0  . OxyCODONE (OXYCONTIN) 10 mg T12A 12 hr tablet Take 1 tablet (10 mg total) by mouth every 12 (twelve) hours. 60 tablet 0  . PRESCRIPTION MEDICATION Antibody Plan [facility]    . senna-docusate (SENNA S) 8.6-50 MG tablet Take 2 tablets by mouth at bedtime. 60 tablet 1   No current facility-administered medications for this visit.    REVIEW OF SYSTEMS:   Constitutional: Denies fevers, chills or abnormal night sweats Eyes: Denies blurriness of vision, double vision or watery eyes Ears, nose, mouth, throat, and face: Denies mucositis or sore throat Respiratory: Denies cough, dyspnea or wheezes Cardiovascular: Denies palpitation, chest discomfort or lower extremity swelling Gastrointestinal:  Denies nausea, heartburn or change in bowel habits Skin: Denies abnormal skin rashes Lymphatics: Denies new lymphadenopathy or easy bruising Neurological:Denies numbness, tingling or new weaknesses Behavioral/Psych: Mildly depressed affect ,Mood is stable no suicidal or homicidal ideation   All other systems were reviewed with the patient and are negative.   PHYSICAL EXAMINATION: ECOG PERFORMANCE STATUS: 2 - Symptomatic, <50% confined to bed  Filed Vitals:   10/07/15 1041  BP: 127/71  Pulse: 94  Temp: 97.9 F (36.6  C)  Resp: 17   Filed Weights   10/07/15 1041  Weight: 124 lb 9.6 oz (56.518 kg)   GENERAL:alert, pain well controlled. SKIN: skin color, texture, turgor are normal, no rashes or significant lesions EYES: normal, conjunctiva are pink and non-injected, sclera clear OROPHARYNX:no exudate, no erythema and lips, buccal mucosa, and tongue normal  NECK: supple, thyroid normal size, non-tender, without nodularity LYMPH:  no palpable lymphadenopathy in the cervical, axillary or inguinal LUNGS: clear to auscultation and bilateral reduced breath sounds with normal respiratory effort  HEART: regular rate & rhythm and no murmurs and resolved lower extremity edema ABDOMEN:abdomen soft, non-tender and normal bowel sounds Musculoskeletal: No pedal edema. No calf pain or tenderness.  PSYCH: alert & oriented x 3 with fluent  speech NEURO: no focal motor/sensory deficits.  LABORATORY DATA:  I have reviewed the data as listed . CBC Latest Ref Rng 10/07/2015 09/24/2015 09/03/2015  WBC 3.9 - 10.3 10e3/uL 5.3 5.1 6.0  Hemoglobin 11.6 - 15.9 g/dL 13.2 12.4 12.2  Hematocrit 34.8 - 46.6 % 41.4 38.9 37.6  Platelets 145 - 400 10e3/uL 218 238 285   . CMP Latest Ref Rng 10/07/2015 09/24/2015 09/03/2015  Glucose 70 - 140 mg/dl 89 102 101  BUN 7.0 - 26.0 mg/dL 12.1 11.2 9.8  Creatinine 0.6 - 1.1 mg/dL 0.7 0.8 0.8  Sodium 136 - 145 mEq/L 138 140 138  Potassium 3.5 - 5.1 mEq/L 4.6 4.2 4.5  Chloride 101 - 111 mmol/L - - -  CO2 22 - 29 mEq/L 28 24 27   Calcium 8.4 - 10.4 mg/dL 9.7 9.4 8.6  Total Protein 6.4 - 8.3 g/dL 7.8 7.3 6.8  Total Bilirubin 0.20 - 1.20 mg/dL <0.30 <0.30 0.34  Alkaline Phos 40 - 150 U/L 88 74 87  AST 5 - 34 U/L 17 15 13   ALT 0 - 55 U/L 10 <9 <9     RADIOGRAPHIC STUDIES: I have personally reviewed the radiological images as listed and agreed with the findings in the report. No results found.  ASSESSMENT & PLAN:   #1 Metastatic poorly differentiated carcinoma with likely lung primary  [non-small cell lung cancer]. Less likely would possibly upper GI. CT of the head with and without contrast showed no evidence of metastatic disease. Patient notes much improved pain control. EGFR blood test mutation analysis negative. Patient's pain is much better controlled after radiation for the painful ilium met.  CT chest abdomen pelvis on 08/20/2015 showed improvement in the mediastinal adenopathy, stable lung nodules and some increase in the size of her left ilial lesion.  The increase in the left ilial lesion does not appear to be a true progression since the patient's clinical symptoms have much improved after XRT it might suggest a post CT pre XRT increase in size or pseudo-progression due to tumor infiltrating immune cells.  Patient is currently status post XRT to her left ilial lesion for palliative control of her symptoms. She has received 4 cycles of Nivolumab. He has tolerated treatment without any prohibitive toxicities.  Plan -Labs are stable today and the patient is appropriate to proceed with her 5th cycle of IV Nivolumab -continue Xgeva q4weeks  - we will see patient back prior to the 6 cycle of Nivolumab with CT Chest/abd/pelvis and bone scan -Continue current pain management.   #2 Anxiety and depression related to her life-threatening diagnosis of cancer . Patient did not tolerate Remeron due to nightmares  symptoms improved with increase in the dose of Celexa to 20 mg daily  plan -Continue on Celexa 20 mg by mouth daily    #3 Barrett's esophagus 4cms in the distal esophagus with low and high-grade dysplasia cannot rule out an early intra-mucosal esophageal adenocarcinoma. Plan -Patient being monitored by Dr. [name] from GI.   All questions were answered. The patient knows to call the clinic with any problems, questions or concerns.  I spent 15 minutes counseling the patient face to face, discussing her new treatments and potential adverse effects and  answering all the patients questions. The total time spent in the appointment was 15 minutes and more than 50% was on counseling.  Sullivan Lone MD [place] Hematology/Oncology Physician [facility]  (Office): [phone] (Work cell): [phone] (Fax): [phone]

## 2015-10-08 ENCOUNTER — Ambulatory Visit (HOSPITAL_BASED_OUTPATIENT_CLINIC_OR_DEPARTMENT_OTHER): Payer: Medicare Other

## 2015-10-08 ENCOUNTER — Other Ambulatory Visit: Payer: Medicare Other

## 2015-10-08 ENCOUNTER — Ambulatory Visit: Payer: Medicare Other

## 2015-10-08 VITALS — BP 98/55 | HR 73 | Temp 97.9°F | Resp 20

## 2015-10-08 DIAGNOSIS — C7951 Secondary malignant neoplasm of bone: Secondary | ICD-10-CM

## 2015-10-08 DIAGNOSIS — Z5112 Encounter for antineoplastic immunotherapy: Secondary | ICD-10-CM | POA: Diagnosis not present

## 2015-10-08 DIAGNOSIS — C3490 Malignant neoplasm of unspecified part of unspecified bronchus or lung: Secondary | ICD-10-CM

## 2015-10-08 MED ORDER — DENOSUMAB 120 MG/1.7ML ~~LOC~~ SOLN
120.0000 mg | Freq: Once | SUBCUTANEOUS | Status: AC
  Administered 2015-10-08: 120 mg via SUBCUTANEOUS
  Filled 2015-10-08: qty 1.7

## 2015-10-08 MED ORDER — SODIUM CHLORIDE 0.9 % IV SOLN
240.0000 mg | Freq: Once | INTRAVENOUS | Status: AC
  Administered 2015-10-08: 240 mg via INTRAVENOUS
  Filled 2015-10-08: qty 8

## 2015-10-08 MED ORDER — SODIUM CHLORIDE 0.9 % IV SOLN
Freq: Once | INTRAVENOUS | Status: AC
  Administered 2015-10-08: 16:00:00 via INTRAVENOUS

## 2015-10-08 MED ORDER — HEPARIN SOD (PORK) LOCK FLUSH 100 UNIT/ML IV SOLN
500.0000 [IU] | Freq: Once | INTRAVENOUS | Status: AC | PRN
  Administered 2015-10-08: 500 [IU]
  Filled 2015-10-08: qty 5

## 2015-10-08 MED ORDER — SODIUM CHLORIDE 0.9 % IJ SOLN
10.0000 mL | INTRAMUSCULAR | Status: DC | PRN
  Administered 2015-10-08: 10 mL
  Filled 2015-10-08: qty 10

## 2015-10-08 NOTE — Patient Instructions (Signed)
[facility] Discharge Instructions for Patients Receiving Chemotherapy  Today you received the following chemotherapy agents:  Nivolumab.  To help prevent nausea and vomiting after your treatment, we encourage you to take your nausea medication as directed.   If you develop nausea and vomiting that is not controlled by your nausea medication, call the clinic.   BELOW ARE SYMPTOMS THAT SHOULD BE REPORTED IMMEDIATELY:  *FEVER GREATER THAN 100.5 F  *CHILLS WITH OR WITHOUT FEVER  NAUSEA AND VOMITING THAT IS NOT CONTROLLED WITH YOUR NAUSEA MEDICATION  *UNUSUAL SHORTNESS OF BREATH  *UNUSUAL BRUISING OR BLEEDING  TENDERNESS IN MOUTH AND THROAT WITH OR WITHOUT PRESENCE OF ULCERS  *URINARY PROBLEMS  *BOWEL PROBLEMS  UNUSUAL RASH Items with * indicate a potential emergency and should be followed up as soon as possible.  Feel free to call the clinic you have any questions or concerns. The clinic phone number is [phone].  Please show the CHEMO ALERT CARD at check-in to the Emergency Department and triage nurse.   

## 2015-10-08 NOTE — Progress Notes (Unsigned)
X-geva injection given by infusion nurse

## 2015-10-09 NOTE — Progress Notes (Signed)
Cindy Byrd    HEMATOLOGY ONCOLOGY PROGRESS NOTE  Date of service: .09/24/2015   Patient Care Team: Tamsen Roers, MD as PCP - General (Family Medicine) [name], MD as Consulting Physician (Hematology and Oncology)  CHIEF COMPLAINTS/PURPOSE OF CONSULTATION: Follow-up for metastatic malignancy with likely lung primary.  HISTORY OF PRESENTING ILLNESS: (plz see my previous consultation for details of initial presentation)  DIAGNOSIS  1) Metastatic non-small cell lung cancer currently on IV Nivolumab 2) Barrett high-grade and low-grade dysplasia's esophagus with Possible intramucosal adenocarcinoma being managed by GI- Dr. [name]  INTERVAL HISTORY  [name] he is here with her husband for follow-up.  Notes no acute new symptoms and has some fatigue. No skin rashes, mouth sores, elevated liver functions, diarrhea. Feels less energy. We counseled about need for increasing physical activity and oral intake of food. Notes her depression has improved with Celexa. Still with some left hip pain. No other acute new symptoms.   MEDICAL HISTORY:  Past Medical History  Diagnosis Date  . IBS (irritable bowel syndrome)   . Bone neoplasm 06/24/2015  . GERD (gastroesophageal reflux disease)   . Vitamin D deficiency disease   . Cigarette smoker two packs a day or less     Currently still smoking 2 PPD - Not interested in quitting at this time.  . Colon polyps     hyperplastic, tubular adenomas, tubulovillous adenoma  . Endometriosis     Hysterectomy with BSO at age 54 yrs  . H/O: pneumonia   . Heavy smoker (more than 20 cigarettes per day) 06/24/2015  . Depression 06/24/2015  . Hypertension 06/24/2015    likely improved incidental to 40 lbs weight loss from her neoplasm. No Longer taking med for this as of 08-06-15  . Cancer [facility])     metastatic poorly differentiated carcinoma. tumor left groin surgical removal with radiation tx.  . Cough, persistent     hx. lung cancer ? primary-being  evaluated, unsure of primary site.  . Pain     left hip-persistent"tumor of bone"-radiation tx. 10.  . Swelling of ankle     bilateral  . Pulmonary nodules   . Barrett's esophagus   . Hiatal hernia   . Gastritis   . Esophageal adenocarcinoma ([place]) 08/11/15    intramucosal  . Diverticulosis    SURGICAL HISTORY: Past Surgical History  Procedure Laterality Date  . Abdominal hysterectomy    . Tonsillectomy    . Ganglion cyst excision    . Total abdominal hysterectomy w/ bilateral salpingoophorectomy  at age 56 yrs    For endometriosis  . Knee arthroscopy  age about 57 yrs  . Bartholin gland cyst excision  74 yo ago    Does not want if it was an infected cyst or tumor. Was soon as delivery  . Colonoscopy w/ polypectomy      multiple times - last done 09/2014 per patient.  . Esophagogastroduodenoscopy (egd) with propofol N/A 08/11/2015    Procedure: ESOPHAGOGASTRODUODENOSCOPY (EGD) WITH PROPOFOL;  Surgeon: [name], MD;  Location: [facility];  Service: Gastroenterology;  Laterality: N/A;    SOCIAL HISTORY: Social History   Social History  . Marital Status: Widowed    Spouse Name: N/A  . Number of Children: 2  . Years of Education: N/A   Occupational History  . Not on file.   Social History Main Topics  . Smoking status: Current Every Day Smoker -- 1.00 packs/day for 60 years    Types: Cigarettes  . Smokeless  tobacco: Never Used  . Alcohol Use: Yes     Comment: occassional   . Drug Use: No  . Sexual Activity: No   Other Topics Concern  . Not on file   Social History Narrative    FAMILY HISTORY: Family History  Problem Relation Age of Onset  . Stroke Mother   . Colon cancer Father   . Colon cancer Brother   . Colon cancer Brother   . Breast cancer Daughter 1    ER/PR+ stage II    ALLERGIES:  is allergic to penicillins and latex. patient wonders if she has a penicillin allergy but notes that she is uncertain about this.  MEDICATIONS:  Current  Outpatient Prescriptions  Medication Sig Dispense Refill  . amLODipine (NORVASC) 5 MG tablet     . citalopram (CELEXA) 20 MG tablet Take 1 tablet (20 mg total) by mouth daily. 30 tablet 2  . docusate sodium (COLACE) 100 MG capsule Take 100 mg by mouth daily as needed for mild constipation.    Cindy Byrd dronabinol (MARINOL) 5 MG capsule Take 1 capsule (5 mg total) by mouth 2 (two) times daily before a meal. 60 capsule 0  . lidocaine-prilocaine (EMLA) cream Apply small amount over port 1-2 hours prior to treatment, cover with plastic wrap (DO NOT RUB IN). 30 g prn  . omeprazole (PRILOSEC OTC) 20 MG tablet Take 20 mg by mouth every morning.     Cindy Byrd oxyCODONE (OXY IR/ROXICODONE) 5 MG immediate release tablet Take 1 tablet (5 mg total) by mouth every 4 (four) hours as needed for severe pain. 60 tablet 0  . OxyCODONE (OXYCONTIN) 10 mg T12A 12 hr tablet Take 1 tablet (10 mg total) by mouth every 12 (twelve) hours. 60 tablet 0  . PRESCRIPTION MEDICATION Antibody Plan [facility]    . senna-docusate (SENNA S) 8.6-50 MG tablet Take 2 tablets by mouth at bedtime. 60 tablet 1   No current facility-administered medications for this visit.    REVIEW OF SYSTEMS:   Constitutional: Denies fevers, chills or abnormal night sweats Eyes: Denies blurriness of vision, double vision or watery eyes Ears, nose, mouth, throat, and face: Denies mucositis or sore throat Respiratory: Denies cough, dyspnea or wheezes Cardiovascular: Denies palpitation, chest discomfort or lower extremity swelling Gastrointestinal:  Denies nausea, heartburn or change in bowel habits Skin: Denies abnormal skin rashes Lymphatics: Denies new lymphadenopathy or easy bruising Neurological:Denies numbness, tingling or new weaknesses Behavioral/Psych: Mildly depressed affect ,Mood is stable no suicidal or homicidal ideation   All other systems were reviewed with the patient and are negative.   PHYSICAL EXAMINATION: ECOG PERFORMANCE STATUS: 2 - Symptomatic,  <50% confined to bed  Filed Vitals:   09/24/15 1341  BP: 118/60  Pulse: 78  Temp: 97.6 F (36.4 C)  Resp: 18   Filed Weights   09/24/15 1341  Weight: 124 lb 8 oz (56.473 kg)   GENERAL:alert, pain well controlled. SKIN: skin color, texture, turgor are normal, no rashes or significant lesions EYES: normal, conjunctiva are pink and non-injected, sclera clear OROPHARYNX:no exudate, no erythema and lips, buccal mucosa, and tongue normal  NECK: supple, thyroid normal size, non-tender, without nodularity LYMPH:  no palpable lymphadenopathy in the cervical, axillary or inguinal LUNGS: clear to auscultation and bilateral reduced breath sounds with normal respiratory effort  HEART: regular rate & rhythm and no murmurs and resolved lower extremity edema ABDOMEN:abdomen soft, non-tender and normal bowel sounds Musculoskeletal: No pedal edema. No calf pain or tenderness.  PSYCH: alert &  oriented x 3 with fluent speech NEURO: no focal motor/sensory deficits.  LABORATORY DATA:  Component     Latest Ref Rng 09/24/2015  WBC     3.9 - 10.3 10e3/uL 5.1  NEUT#     1.5 - 6.5 10e3/uL 3.7  Hemoglobin     11.6 - 15.9 g/dL 12.4  HCT     34.8 - 46.6 % 38.9  Platelets     145 - 400 10e3/uL 238  MCV     79.5 - 101.0 fL 88.0  MCH     25.1 - 34.0 pg 28.1  MCHC     31.5 - 36.0 g/dL 31.9  RBC     3.70 - 5.45 10e6/uL 4.42  RDW     11.2 - 14.5 % 15.1 (H)  lymph#     0.9 - 3.3 10e3/uL 0.9  MONO#     0.1 - 0.9 10e3/uL 0.4  Eosinophils Absolute     0.0 - 0.5 10e3/uL 0.1  Basophils Absolute     0.0 - 0.1 10e3/uL 0.1  NEUT%     38.4 - 76.8 % 72.3  LYMPH%     14.0 - 49.7 % 17.0  MONO%     0.0 - 14.0 % 7.5  EOS%     0.0 - 7.0 % 2.0  BASO%     0.0 - 2.0 % 1.2  Retic %     0.70 - 2.10 % 1.05  Retic Ct Abs     33.70 - 90.70 10e3/uL 46.41  Immature Retic Fract     1.60 - 10.00 % 3.50  Sodium     136 - 145 mEq/L 140  Potassium     3.5 - 5.1 mEq/L 4.2  Chloride     98 - 109 mEq/L 110  (H)  CO2     22 - 29 mEq/L 24  Glucose     70 - 140 mg/dl 102  BUN     7.0 - 26.0 mg/dL 11.2  Creatinine     0.6 - 1.1 mg/dL 0.8  Total Bilirubin     0.20 - 1.20 mg/dL <0.30  Alkaline Phosphatase     40 - 150 U/L 74  AST     5 - 34 U/L 15  ALT     0 - 55 U/L <9  Total Protein     6.4 - 8.3 g/dL 7.3  Albumin     3.5 - 5.0 g/dL 3.0 (L)  Calcium     8.4 - 10.4 mg/dL 9.4  Anion gap     3 - 11 mEq/L 6  EGFR     >90 ml/min/1.73 m2 78 (L)    RADIOGRAPHIC STUDIES: I have personally reviewed the radiological images as listed and agreed with the findings in the report. No results found.  ASSESSMENT & PLAN:   #1 Metastatic poorly differentiated carcinoma with likely lung primary [non-small cell lung cancer].  Less likely would possibly upper GI. CT of the head with and without contrast showed no evidence of metastatic disease. Patient notes much improved pain control. EGFR blood test mutation analysis negative. Patient's pain is much better controlled after radiation for the painful ilium met.  CT chest abdomen pelvis on 08/20/2015 showed improvement in the mediastinal adenopathy, stable lung nodules and some increase in the size of her left ilial lesion.  The increase in the left ilial lesion does not appear to be a true progression since the patient's clinical symptoms have much improved after XRT  it might suggest a post CT pre XRT increase in size or pseudo-progression due to tumor infiltrating immune cells.  Patient is currently status post XRT to her left ilial lesion for palliative control of her symptoms. She has received 4 cycles of Nivolumab. He has tolerated treatment without any prohibitive toxicities.  Plan -Labs are stable today and the patient is appropriate to proceed with her fourth cycle of IV Nivolumab - rediscussed smoking cessation.  -continue Marchelle Folks  - we will see patient back prior to the 5 cycle of Nivolumab. -Encouraged him to use oral for dental  fluid intake. -Encouraged patient to try to increase her ambulation and get some physical activity. -Continue current pain management.  #2 Anxiety and depression related to her life-threatening diagnosis of cancer . Patient did not tolerate Remeron due to nightmares   symptoms improved with increase in the dose of Celexa to 20 mg daily   plan -Continue on Celexa 20 mg by mouth daily  -Counseled the patient to remain as physically active as possible   #3 Barrett's esophagus 4cms in the distal esophagus with low and high-grade dysplasia cannot rule out an early intra-mucosal esophageal adenocarcinoma.   Plan -Patient being monitored by Dr. [name] from GI. -he is planning to follow her up to relook at the lesion and if that is significant change in the lesion and her metastatic lung cancer is under control he was planning to potentially refer her to [facility] for RF of her esophageal lesions.  All questions were answered. The patient knows to call the clinic with any problems, questions or concerns.  I spent 20 minutes counseling the patient face to face, discussing her new treatments and potential adverse effects and answering all the patients questions. The total time spent in the appointment was 25 minutes and more than 50% was on counseling.   Sullivan Lone MD [place] [facility]/Oncology Physician [facility]  (Office):       [phone] (Work cell):  [phone] (Fax):           [phone]

## 2015-10-21 ENCOUNTER — Encounter (HOSPITAL_COMMUNITY)
Admission: RE | Admit: 2015-10-21 | Discharge: 2015-10-21 | Disposition: A | Discharge status: Home / Self Care | Payer: Medicare Other | Source: Ambulatory Visit | Attending: Hematology | Admitting: Hematology

## 2015-10-21 ENCOUNTER — Ambulatory Visit (HOSPITAL_COMMUNITY)
Admission: RE | Admit: 2015-10-21 | Discharge: 2015-10-21 | Disposition: A | Discharge status: Home / Self Care | Payer: Medicare Other | Source: Ambulatory Visit | Attending: Hematology | Admitting: Hematology

## 2015-10-21 ENCOUNTER — Encounter (HOSPITAL_COMMUNITY): Payer: Self-pay

## 2015-10-21 DIAGNOSIS — J432 Centrilobular emphysema: Secondary | ICD-10-CM | POA: Insufficient documentation

## 2015-10-21 DIAGNOSIS — K219 Gastro-esophageal reflux disease without esophagitis: Secondary | ICD-10-CM | POA: Diagnosis not present

## 2015-10-21 DIAGNOSIS — K7689 Other specified diseases of liver: Secondary | ICD-10-CM | POA: Diagnosis not present

## 2015-10-21 DIAGNOSIS — K5730 Diverticulosis of large intestine without perforation or abscess without bleeding: Secondary | ICD-10-CM | POA: Diagnosis not present

## 2015-10-21 DIAGNOSIS — Z79899 Other long term (current) drug therapy: Secondary | ICD-10-CM | POA: Insufficient documentation

## 2015-10-21 DIAGNOSIS — M25552 Pain in left hip: Secondary | ICD-10-CM | POA: Insufficient documentation

## 2015-10-21 DIAGNOSIS — I700 Atherosclerosis of aorta: Secondary | ICD-10-CM | POA: Diagnosis not present

## 2015-10-21 DIAGNOSIS — C7951 Secondary malignant neoplasm of bone: Secondary | ICD-10-CM | POA: Diagnosis not present

## 2015-10-21 DIAGNOSIS — I517 Cardiomegaly: Secondary | ICD-10-CM | POA: Insufficient documentation

## 2015-10-21 DIAGNOSIS — C3490 Malignant neoplasm of unspecified part of unspecified bronchus or lung: Secondary | ICD-10-CM | POA: Diagnosis not present

## 2015-10-21 DIAGNOSIS — M8580 Other specified disorders of bone density and structure, unspecified site: Secondary | ICD-10-CM | POA: Insufficient documentation

## 2015-10-21 MED ORDER — IOHEXOL 300 MG/ML  SOLN
100.0000 mL | Freq: Once | INTRAMUSCULAR | Status: AC | PRN
  Administered 2015-10-21: 100 mL via INTRAVENOUS

## 2015-10-21 MED ORDER — TECHNETIUM TC 99M MEDRONATE IV KIT
27.1000 | PACK | Freq: Once | INTRAVENOUS | Status: AC | PRN
  Administered 2015-10-21: 27.1 via INTRAVENOUS

## 2015-10-22 ENCOUNTER — Ambulatory Visit (HOSPITAL_BASED_OUTPATIENT_CLINIC_OR_DEPARTMENT_OTHER): Payer: Medicare Other

## 2015-10-22 ENCOUNTER — Telehealth: Payer: Self-pay | Admitting: Hematology

## 2015-10-22 ENCOUNTER — Ambulatory Visit (HOSPITAL_BASED_OUTPATIENT_CLINIC_OR_DEPARTMENT_OTHER): Payer: Medicare Other | Admitting: Hematology

## 2015-10-22 ENCOUNTER — Ambulatory Visit: Payer: Medicare Other

## 2015-10-22 ENCOUNTER — Telehealth: Payer: Self-pay | Admitting: *Deleted

## 2015-10-22 ENCOUNTER — Other Ambulatory Visit (HOSPITAL_BASED_OUTPATIENT_CLINIC_OR_DEPARTMENT_OTHER): Payer: Medicare Other

## 2015-10-22 ENCOUNTER — Encounter: Payer: Self-pay | Admitting: Hematology

## 2015-10-22 VITALS — BP 99/55 | HR 82 | Temp 98.2°F | Resp 18 | Ht 68.0 in | Wt 126.7 lb

## 2015-10-22 DIAGNOSIS — K22711 Barrett's esophagus with high grade dysplasia: Secondary | ICD-10-CM | POA: Diagnosis not present

## 2015-10-22 DIAGNOSIS — Z79899 Other long term (current) drug therapy: Secondary | ICD-10-CM

## 2015-10-22 DIAGNOSIS — G893 Neoplasm related pain (acute) (chronic): Secondary | ICD-10-CM

## 2015-10-22 DIAGNOSIS — F419 Anxiety disorder, unspecified: Secondary | ICD-10-CM

## 2015-10-22 DIAGNOSIS — C3490 Malignant neoplasm of unspecified part of unspecified bronchus or lung: Secondary | ICD-10-CM

## 2015-10-22 DIAGNOSIS — Z95828 Presence of other vascular implants and grafts: Secondary | ICD-10-CM

## 2015-10-22 DIAGNOSIS — Z5112 Encounter for antineoplastic immunotherapy: Secondary | ICD-10-CM | POA: Diagnosis not present

## 2015-10-22 LAB — CBC & DIFF AND RETIC
BASO%: 0.2 % (ref 0.0–2.0)
Basophils Absolute: 0 10*3/uL (ref 0.0–0.1)
EOS%: 2.9 % (ref 0.0–7.0)
Eosinophils Absolute: 0.3 10*3/uL (ref 0.0–0.5)
HCT: 38.2 % (ref 34.8–46.6)
HGB: 12.2 g/dL (ref 11.6–15.9)
IMMATURE RETIC FRACT: 2.8 % (ref 1.60–10.00)
LYMPH#: 0.5 10*3/uL — AB (ref 0.9–3.3)
LYMPH%: 5.1 % — ABNORMAL LOW (ref 14.0–49.7)
MCH: 28.1 pg (ref 25.1–34.0)
MCHC: 31.9 g/dL (ref 31.5–36.0)
MCV: 88 fL (ref 79.5–101.0)
MONO#: 0.4 10*3/uL (ref 0.1–0.9)
MONO%: 4.2 % (ref 0.0–14.0)
NEUT%: 87.6 % — ABNORMAL HIGH (ref 38.4–76.8)
NEUTROS ABS: 8.5 10*3/uL — AB (ref 1.5–6.5)
Platelets: 172 10*3/uL (ref 145–400)
RBC: 4.34 10*6/uL (ref 3.70–5.45)
RDW: 15.7 % — ABNORMAL HIGH (ref 11.2–14.5)
RETIC %: 1 % (ref 0.70–2.10)
RETIC CT ABS: 43.4 10*3/uL (ref 33.70–90.70)
WBC: 9.8 10*3/uL (ref 3.9–10.3)

## 2015-10-22 LAB — COMPREHENSIVE METABOLIC PANEL (CC13)
ALT: 15 U/L (ref 0–55)
AST: 32 U/L (ref 5–34)
Albumin: 3 g/dL — ABNORMAL LOW (ref 3.5–5.0)
Alkaline Phosphatase: 71 U/L (ref 40–150)
Anion Gap: 7 mEq/L (ref 3–11)
BILIRUBIN TOTAL: 0.44 mg/dL (ref 0.20–1.20)
BUN: 20 mg/dL (ref 7.0–26.0)
CO2: 22 meq/L (ref 22–29)
Calcium: 8.8 mg/dL (ref 8.4–10.4)
Chloride: 108 mEq/L (ref 98–109)
Creatinine: 0.9 mg/dL (ref 0.6–1.1)
EGFR: 64 mL/min/{1.73_m2} — AB (ref >90)
GLUCOSE: 99 mg/dL (ref 70–140)
Potassium: 4.1 mEq/L (ref 3.5–5.1)
SODIUM: 137 meq/L (ref 136–145)
TOTAL PROTEIN: 6.5 g/dL (ref 6.4–8.3)

## 2015-10-22 LAB — TSH CHCC: TSH: 2.211 m(IU)/L (ref 0.308–3.960)

## 2015-10-22 MED ORDER — LORAZEPAM 1 MG PO TABS: 1.0000 mg | ORAL_TABLET | Freq: Three times a day (TID) | ORAL | Status: DC | PRN

## 2015-10-22 MED ORDER — HEPARIN SOD (PORK) LOCK FLUSH 100 UNIT/ML IV SOLN
500.0000 [IU] | Freq: Once | INTRAVENOUS | Status: AC | PRN
  Administered 2015-10-22: 500 [IU]
  Filled 2015-10-22: qty 5

## 2015-10-22 MED ORDER — CITALOPRAM HYDROBROMIDE 20 MG PO TABS: 20.0000 mg | ORAL_TABLET | Freq: Every day | ORAL | Status: DC

## 2015-10-22 MED ORDER — SODIUM CHLORIDE 0.9 % IJ SOLN
10.0000 mL | INTRAMUSCULAR | Status: DC | PRN
  Administered 2015-10-22: 10 mL
  Filled 2015-10-22: qty 10

## 2015-10-22 MED ORDER — SODIUM CHLORIDE 0.9 % IV SOLN
Freq: Once | INTRAVENOUS | Status: AC
  Administered 2015-10-22: 11:00:00 via INTRAVENOUS

## 2015-10-22 MED ORDER — FAMOTIDINE IN NACL 20-0.9 MG/50ML-% IV SOLN
INTRAVENOUS | Status: AC
  Filled 2015-10-22: qty 50

## 2015-10-22 MED ORDER — SODIUM CHLORIDE 0.9 % IV SOLN
240.0000 mg | Freq: Once | INTRAVENOUS | Status: AC
  Administered 2015-10-22: 240 mg via INTRAVENOUS
  Filled 2015-10-22: qty 20

## 2015-10-22 MED ORDER — DIPHENHYDRAMINE HCL 50 MG/ML IJ SOLN
25.0000 mg | Freq: Once | INTRAMUSCULAR | Status: AC
  Administered 2015-10-22: 25 mg via INTRAVENOUS

## 2015-10-22 MED ORDER — FAMOTIDINE IN NACL 20-0.9 MG/50ML-% IV SOLN
20.0000 mg | Freq: Once | INTRAVENOUS | Status: AC
  Administered 2015-10-22: 20 mg via INTRAVENOUS

## 2015-10-22 MED ORDER — DIPHENHYDRAMINE HCL 50 MG/ML IJ SOLN
INTRAMUSCULAR | Status: AC
  Filled 2015-10-22: qty 1

## 2015-10-22 MED ORDER — SODIUM CHLORIDE 0.9 % IJ SOLN
10.0000 mL | INTRAMUSCULAR | Status: DC | PRN
  Administered 2015-10-22: 10 mL via INTRAVENOUS
  Filled 2015-10-22: qty 10

## 2015-10-22 NOTE — Patient Instructions (Signed)
[facility] Discharge Instructions for Patients Receiving Chemotherapy  Today you received the following chemotherapy agents Nivolumab.  To help prevent nausea and vomiting after your treatment, we encourage you to take your nausea medication as prescribed.   If you develop nausea and vomiting that is not controlled by your nausea medication, call the clinic.   BELOW ARE SYMPTOMS THAT SHOULD BE REPORTED IMMEDIATELY:  *FEVER GREATER THAN 100.5 F  *CHILLS WITH OR WITHOUT FEVER  NAUSEA AND VOMITING THAT IS NOT CONTROLLED WITH YOUR NAUSEA MEDICATION  *UNUSUAL SHORTNESS OF BREATH  *UNUSUAL BRUISING OR BLEEDING  TENDERNESS IN MOUTH AND THROAT WITH OR WITHOUT PRESENCE OF ULCERS  *URINARY PROBLEMS  *BOWEL PROBLEMS  UNUSUAL RASH Items with * indicate a potential emergency and should be followed up as soon as possible.  Feel free to call the clinic you have any questions or concerns. The clinic phone number is [phone].  Please show the CHEMO ALERT CARD at check-in to the Emergency Department and triage nurse.   

## 2015-10-22 NOTE — Telephone Encounter (Signed)
Per staff message and POF I have scheduled appts. Advised scheduler of appts. JMW  

## 2015-10-22 NOTE — Patient Instructions (Signed)

## 2015-10-22 NOTE — Telephone Encounter (Signed)
per pof to sch pt appt-sent MW email to sch pt trmt-pt to get updated copy of sch b4 leaving

## 2015-10-22 NOTE — Telephone Encounter (Signed)
per pof to sch pt appt-cld & spoke to pt and gave pt time & date of next appt 12/1-pt understood

## 2015-10-27 ENCOUNTER — Encounter: Payer: Self-pay | Admitting: Internal Medicine

## 2015-10-27 ENCOUNTER — Ambulatory Visit (INDEPENDENT_AMBULATORY_CARE_PROVIDER_SITE_OTHER): Payer: Medicare Other | Admitting: Internal Medicine

## 2015-10-27 VITALS — BP 140/78 | HR 68 | Ht 65.75 in | Wt 128.5 lb

## 2015-10-27 DIAGNOSIS — K22711 Barrett's esophagus with high grade dysplasia: Secondary | ICD-10-CM

## 2015-10-27 MED ORDER — OMEPRAZOLE 40 MG PO CPDR: 40.0000 mg | DELAYED_RELEASE_CAPSULE | Freq: Every day | ORAL | Status: DC

## 2015-10-27 NOTE — Progress Notes (Signed)
Subjective:    Patient ID: Cindy Byrd, female    DOB: 17-Sep-1941, 74 y.o.   MRN: [id number]  HPI Cindy Byrd is a 74 yo female with metastatic cancer, most probably lung, currently undergoing chemotherapy who is seen for follow-up. She came for endoscopy in September to evaluate for  potential source of metastatic malignancy. She was found to have Barrett's esophagus with high-grade dysplasia and possible intramucosal adenocarcinoma. This was not felt to be the source of her metastatic disease. The decision was made at that time given that she was facing chemotherapy and radiation to continue treatment for metastatic cancer and revisit the Barrett's esophagus with at least high-grade dysplasia. This visit is to rediscuss her endoscopy findings.   She reports that she is feeling well. She underwent 10 treatments of radiation to her hip. Her hip pain improved though is still there but "lesser degree. She's undergone 6 cycles of immunotherapy. This caused extreme fatigue but her energy is slowly improving. Her appetite has been overall decreased but she denies abdominal pain. She denies heartburn. No nausea or vomiting. No dysphagia or odynophagia. Bowel habits of been slightly constipated but she's been using Colace which has helped tremendously. No rectal bleeding or melena. She has recently had surveillance CT scan.  She is taking omeprazole more on an as-needed basis as opposed to daily.  Review of Systems  as per history of present illness, otherwise negative   Current Medications, Allergies, Past Medical History, Past Surgical History, Family History and Social History were reviewed in [employer] record.     Objective:   Physical Exam BP 140/78 mmHg  Pulse 68  Ht 5' 5.75" (1.67 m)  Wt 128 lb 8 oz (58.287 kg)  BMI 20.90 kg/m2 Constitutional: Well-developed and well-nourished. No distress. HEENT: Normocephalic and atraumatic.  Conjunctivae are normal.  No  scleral icterus. Neck: Neck supple. Trachea midline. Cardiovascular: Normal rate, regular rhythm and intact distal pulses. No M/R/G Pulmonary/chest: Effort normal and breath sounds normal. No wheezing, rales or rhonchi. Abdominal: Soft, nontender, nondistended. Bowel sounds active throughout.  Extremities: no clubbing, cyanosis, or edema Lymphadenopathy: No cervical adenopathy noted. Neurological: Alert and oriented to person place and time. Skin: Skin is warm and dry. No rashes noted. Psychiatric: Normal mood and affect. Behavior is normal.  CBC    Component Value Date/Time   WBC 9.8 10/22/2015 0951   WBC 6.8 08/18/2015 1305   RBC 4.34 10/22/2015 0951   RBC 4.47 08/18/2015 1305   HGB 12.2 10/22/2015 0951   HGB 12.8 08/18/2015 1305   HCT 38.2 10/22/2015 0951   HCT 39.5 08/18/2015 1305   PLT 172 10/22/2015 0951   PLT 245 08/18/2015 1305   MCV 88.0 10/22/2015 0951   MCV 88.4 08/18/2015 1305   MCH 28.1 10/22/2015 0951   MCH 28.6 08/18/2015 1305   MCHC 31.9 10/22/2015 0951   MCHC 32.4 08/18/2015 1305   RDW 15.7* 10/22/2015 0951   RDW 14.5 08/18/2015 1305   LYMPHSABS 0.5* 10/22/2015 0951   LYMPHSABS 0.9 08/18/2015 1305   MONOABS 0.4 10/22/2015 0951   MONOABS 0.5 08/18/2015 1305   EOSABS 0.3 10/22/2015 0951   EOSABS 0.2 08/18/2015 1305   BASOSABS 0.0 10/22/2015 0951   BASOSABS 0.0 08/18/2015 1305    CMP     Component Value Date/Time   NA 137 10/22/2015 0951   NA 135 08/18/2015 1305   K 4.1 10/22/2015 0951   K 3.4* 08/18/2015 1305   CL 95* 08/18/2015  1305   CO2 22 10/22/2015 0951   CO2 28 08/18/2015 1305   GLUCOSE 99 10/22/2015 0951   GLUCOSE 105* 08/18/2015 1305   BUN 20.0 10/22/2015 0951   BUN 17 08/18/2015 1305   CREATININE 0.9 10/22/2015 0951   CREATININE 0.94 08/18/2015 1305   CALCIUM 8.8 10/22/2015 0951   CALCIUM 8.7* 08/18/2015 1305   PROT 6.5 10/22/2015 0951   ALBUMIN 3.0* 10/22/2015 0951   AST 32 10/22/2015 0951   ALT 15 10/22/2015 0951   ALKPHOS 71  10/22/2015 0951   BILITOT 0.44 10/22/2015 0951   GFRNONAA 58* 08/18/2015 1305   GFRAA >60 08/18/2015 1305   CLINICAL DATA:  lung cancer with bone metastasis diagnosed in July. Chemotherapy ongoing. Gastroesophageal reflux disease. Hernia.   EXAM: CT CHEST, ABDOMEN, AND PELVIS WITH CONTRAST   TECHNIQUE: Multidetector CT imaging of the chest, abdomen and pelvis was performed following the standard protocol during bolus administration of intravenous contrast.   CONTRAST:  189m OMNIPAQUE IOHEXOL 300 MG/ML  SOLN   COMPARISON:  08/20/2015   FINDINGS: CT CHEST FINDINGS   Mediastinum/Nodes: No supraclavicular adenopathy. right Port-A-Cath terminates at the mid to low SVC. No axillary adenopathy. Aortic and branch vessel atherosclerosis. Mild cardiomegaly with lipomatous hypertrophy of the interatrial septum. No central pulmonary embolism, on this non-dedicated study. Previously described 14 mm precarinal node is decreased to resolved. No new mediastinal adenopathy. A left mediastinal/ periesophageal 13 mm node on image 35 of the prior exam is also resolved.   No hilar adenopathy.   Lungs/Pleura: No pleural fluid. Secretions in the inferior aspect of the trachea.   Mild centrilobular emphysema.   Right base scarring. Index right lower lobe pulmonary nodule measures 6 mm (image 38, series 4), similar.   Linear scar-like opacity in the left lower lobe measures 1.4 cm (image 32, series 4), similar.   Minimal more anterior left lower lobe nodularity is felt to be similar (image 30, series 4).   Minimal right upper lobe nodularity on image 18 is similar.   Musculoskeletal: No acute osseous abnormality.   CT ABDOMEN PELVIS FINDINGS   Hepatobiliary: Hepatic cysts. A too small to characterize lateral segment left liver lobe lesion is likely similar (image 62, series 2). Normal gallbladder, without biliary ductal dilatation.   Pancreas: Normal, without mass or ductal  dilatation.   Spleen: Normal in size, without focal abnormality.   Adrenals/Urinary Tract: Normal adrenal glands. Normal kidneys, without hydronephrosis. Normal urinary bladder.   Stomach/Bowel: Decompressed gastric cardia and body. Scattered colonic diverticula. Normal terminal ileum and appendix. Normal small bowel.   Vascular/Lymphatic: Advanced aortic and branch vessel atherosclerosis. No retroperitoneal or retrocrural adenopathy. A left common iliac node measures 8 mm (image 84, series 2), similar. No pelvic adenopathy.   Reproductive: Hysterectomy.  No adnexal mass.   Other: No significant free fluid. No evidence of omental or peritoneal disease.   Musculoskeletal: Osteopenia. Dominant lytic lesion involving the left iliac wing and surrounding soft tissues. 10.8 x 4.0 cm (image 90, series 2), 11.7 x 6.8 cm on the prior exam. No new osseous lesion identified.   IMPRESSION: CT CHEST IMPRESSION   1. Response to therapy of thoracic nodes. 2. Similar mild pulmonary nodularity.   CT ABDOMEN AND PELVIS IMPRESSION   1. Decreased size of the dominant left iliac wing metastasis. 2. No extraosseous metastasis within the abdomen or pelvis.     Electronically Signed   By: KAbigail MiyamotoM.D.   On: 10/21/2015 16:05  Assessment & Plan:  74 yo female with metastatic cancer, most probably lung, currently undergoing chemotherapy who is seen for follow-up of Barrett's esophagus with high-grade dysplasia and inability to exclude intramucosal adenocarcinoma.   1. Barrett's with dysplasia -- patient did have 4 cm segment of suspected Barrett's esophagus biopsied in September. This showed high-grade glandular dysplasia and possibly intramucosal adenocarcinoma. There was no invasion seen. I have recommended surveillance endoscopy at this time for repeat biopsies. She may be a candidate for radiofrequency ablation at [facility]. She would prefer to delay endoscopy for at least 3 more  months as she continues chemotherapy/immunotherapy for her metastatic lung cancer.  she is aware of the risk of progression of esophagus dysplasia and cancer. I recommended that she take omeprazole on a scheduled basis daily given her Barrett's esophagus. 40 mg daily. I've asked that she return in late February to again discuss surveillance endoscopy.  2. Metastatic lung cancer -- undergoing chemotherapy under the direction of Dr. Irene Limbo. Surveillance imaging reviewed showing improvement in lung disease and also bone disease of the pelvis  25 minutes spent with the patient today. Greater than 50% was spent in counseling and coordination of care with the patient

## 2015-10-27 NOTE — Patient Instructions (Signed)
Please follow up in late February to discuss endoscopy.  We have sent the following medications to your pharmacy for you to pick up at your convenience: Omeprazole 40 mg daily

## 2015-11-04 NOTE — Progress Notes (Signed)
[name]    HEMATOLOGY ONCOLOGY PROGRESS NOTE  Date of service: .10/22/2015  Patient Care Team: Tamsen Roers, MD as PCP - General (Family Medicine) [name], MD as Consulting Physician (Hematology and Oncology)  CHIEF COMPLAINTS/PURPOSE OF CONSULTATION: Follow-up for metastatic lung cancer  DIAGNOSIS:   #1 Metastatic non-small cell lung cancer with bilateral lung nodules and large metastatic lesion in the left Ilium. #2 Barrett's esophagus with some evidence of intramucosal adenocarcinoma of the esophagus.  Treatment  1 Palliative radiation therapy to the large left ilium metastases 2. IV Nivolumab x 5 cycles 3. Xgeva 169m [place] q4weeks for bone metastases.   HISTORY OF PRESENTING ILLNESS: (plz see my previous consultation for details of initial presentation)  INTERVAL HISTORY  Cindy Byrd here for her scheduled follow-up prior to the sixth cycle of Nivolumab. Her CT of the chest and bone scan shows good response to treatment with no evidence of disease progression. She notes that she has been more physically active and has been eating better. No other acute new concerns. No skin rashes/nausea or vomiting/diarrhea.   MEDICAL HISTORY:  Past Medical History  Diagnosis Date  . IBS (irritable bowel syndrome)   . Bone neoplasm 06/24/2015  . GERD (gastroesophageal reflux disease)   . Vitamin D deficiency disease   . Cigarette smoker two packs a day or less     Currently still smoking 2 PPD - Not interested in quitting at this time.  . Colon polyps     hyperplastic, tubular adenomas, tubulovillous adenoma  . Endometriosis     Hysterectomy with BSO at age [age]yrs  . H/O: pneumonia   . Heavy smoker (more than 20 cigarettes per day) 06/24/2015  . Depression 06/24/2015  . Hypertension 06/24/2015    likely improved incidental to 40 lbs weight loss from her neoplasm. No Longer taking med for this as of 08-06-15  . Cancer ([facility]     metastatic poorly differentiated carcinoma. tumor  left groin surgical removal with radiation tx.  . Cough, persistent     hx. lung cancer ? primary-being evaluated, unsure of primary site.  . Pain     left hip-persistent"tumor of bone"-radiation tx. 10.  . Swelling of ankle     bilateral  . Pulmonary nodules   . Barrett's esophagus   . Hiatal hernia   . Gastritis   . Esophageal adenocarcinoma ([facility] 08/11/15    intramucosal  . Diverticulosis    SURGICAL HISTORY: Past Surgical History  Procedure Laterality Date  . Abdominal hysterectomy    . Tonsillectomy    . Ganglion cyst excision    . Total abdominal hysterectomy w/ bilateral salpingoophorectomy  at age [age]yrs    For endometriosis  . Knee arthroscopy  age about 89yrs  . Bartholin gland cyst excision  74yo ago    Does not want if it was an infected cyst or tumor. Was soon as delivery  . Colonoscopy w/ polypectomy      multiple times - last done 09/2014 per patient.  . Esophagogastroduodenoscopy (egd) with propofol N/A 08/11/2015    Procedure: ESOPHAGOGASTRODUODENOSCOPY (EGD) WITH PROPOFOL;  Surgeon: JJerene Bears MD;  Location: [facility];  Service: Gastroenterology;  Laterality: N/A;    SOCIAL HISTORY: Social History   Social History  . Marital Status: Widowed    Spouse Name: N/A  . Number of Children: 2  . Years of Education: N/A   Occupational History  . Not on file.   Social History Main  Topics  . Smoking status: Current Every Day Smoker -- 1.00 packs/day for 60 years    Types: Cigarettes  . Smokeless tobacco: Never Used  . Alcohol Use: Yes     Comment: occassional   . Drug Use: No  . Sexual Activity: No   Other Topics Concern  . Not on file   Social History Narrative    FAMILY HISTORY: Family History  Problem Relation Age of Onset  . Stroke Mother   . Colon cancer Father   . Colon cancer Brother   . Colon cancer Brother   . Breast cancer Daughter 69    ER/PR+ stage II    ALLERGIES:  is allergic to penicillins and latex. patient wonders if  she has a penicillin allergy but notes that she is uncertain about this.  MEDICATIONS:  Current Outpatient Prescriptions  Medication Sig Dispense Refill  . amLODipine (NORVASC) 5 MG tablet     . citalopram (CELEXA) 20 MG tablet Take 1 tablet (20 mg total) by mouth daily. 30 tablet 2  . docusate sodium (COLACE) 100 MG capsule Take 100 mg by mouth daily as needed for mild constipation.    . lidocaine-prilocaine (EMLA) cream Apply small amount over port 1-2 hours prior to treatment, cover with plastic wrap (DO NOT RUB IN). 30 g prn  . LORazepam (ATIVAN) 1 MG tablet Take 1 tablet (1 mg total) by mouth every 8 (eight) hours as needed for anxiety (or nausea). 50 tablet 0  . oxyCODONE (OXY IR/ROXICODONE) 5 MG immediate release tablet Take 1 tablet (5 mg total) by mouth every 4 (four) hours as needed for severe pain. 60 tablet 0  . OxyCODONE (OXYCONTIN) 10 mg T12A 12 hr tablet Take 1 tablet (10 mg total) by mouth every 12 (twelve) hours. 60 tablet 0  . PRESCRIPTION MEDICATION Antibody Plan [facility]    . omeprazole (PRILOSEC) 40 MG capsule Take 1 capsule (40 mg total) by mouth daily. 30 capsule 2   No current facility-administered medications for this visit.    REVIEW OF SYSTEMS:    10 point review of systems done and was noted to be negative except as noted above.  PHYSICAL EXAMINATION: ECOG PERFORMANCE STATUS: 2 - Symptomatic, <50% confined to bed  Filed Vitals:   10/22/15 1031  BP: 99/55  Pulse: 82  Temp: 98.2 F (36.8 C)  Resp: 18   Filed Weights   10/22/15 1031  Weight: 126 lb 11.2 oz (57.471 kg)   GENERAL:alert, pain well controlled. SKIN: skin color, texture, turgor are normal, no rashes or significant lesions EYES: normal, conjunctiva are pink and non-injected, sclera clear OROPHARYNX:no exudate, no erythema and lips, buccal mucosa, and tongue normal  NECK: supple, thyroid normal size, non-tender, without nodularity LYMPH:  no palpable lymphadenopathy in the cervical, axillary or  inguinal LUNGS: clear to auscultation and bilateral reduced breath sounds with normal respiratory effort  HEART: regular rate & rhythm and no murmurs and resolved lower extremity edema ABDOMEN:abdomen soft, non-tender and normal bowel sounds Musculoskeletal: No pedal edema. No calf pain or tenderness.  PSYCH: alert & oriented x 3 with fluent speech NEURO: no focal motor/sensory deficits.  LABORATORY DATA:  I have reviewed the data as listed . CBC Latest Ref Rng 10/22/2015 10/07/2015 09/24/2015  WBC 3.9 - 10.3 10e3/uL 9.8 5.3 5.1  Hemoglobin 11.6 - 15.9 g/dL 12.2 13.2 12.4  Hematocrit 34.8 - 46.6 % 38.2 41.4 38.9  Platelets 145 - 400 10e3/uL 172 218 238   . CMP Latest Ref  Rng 10/22/2015 10/07/2015 09/24/2015  Glucose 70 - 140 mg/dl 99 89 102  BUN 7.0 - 26.0 mg/dL 20.0 12.1 11.2  Creatinine 0.6 - 1.1 mg/dL 0.9 0.7 0.8  Sodium 136 - 145 mEq/L 137 138 140  Potassium 3.5 - 5.1 mEq/L 4.1 4.6 4.2  Chloride 101 - 111 mmol/L - - -  CO2 22 - 29 mEq/L _0 Calcium 8.4 - 10.4 mg/dL 8.8 9.7 9.4  Total Protein 6.4 - 8.3 g/dL 6.5 7.8 7.3  Total Bilirubin 0.20 - 1.20 mg/dL 0.44 <0.30 <0.30  Alkaline Phos 40 - 150 U/L 71 88 74  AST 5 - 34 U/L 32 17 15  ALT 0 - 55 U/L 15 10 <9     RADIOGRAPHIC STUDIES: I have personally reviewed the radiological images as listed and agreed with the findings in the report. Ct Chest W Contrast  10/21/2015  CLINICAL DATA:  lung cancer with bone metastasis diagnosed in July. Chemotherapy ongoing. Gastroesophageal reflux disease. Hernia. EXAM: CT CHEST, ABDOMEN, AND PELVIS WITH CONTRAST TECHNIQUE: Multidetector CT imaging of the chest, abdomen and pelvis was performed following the standard protocol during bolus administration of intravenous contrast. CONTRAST:  196m OMNIPAQUE IOHEXOL 300 MG/ML  SOLN COMPARISON:  08/20/2015 FINDINGS: CT CHEST FINDINGS Mediastinum/Nodes: No supraclavicular adenopathy. right Port-A-Cath terminates at the mid to low SVC. No axillary  adenopathy. Aortic and branch vessel atherosclerosis. Mild cardiomegaly with lipomatous hypertrophy of the interatrial septum. No central pulmonary embolism, on this non-dedicated study. Previously described 14 mm precarinal node is decreased to resolved. No new mediastinal adenopathy. A left mediastinal/ periesophageal 13 mm node on image 35 of the prior exam is also resolved. No hilar adenopathy. Lungs/Pleura: No pleural fluid. Secretions in the inferior aspect of the trachea. Mild centrilobular emphysema. Right base scarring. Index right lower lobe pulmonary nodule measures 6 mm (image 38, series 4), similar. Linear scar-like opacity in the left lower lobe measures 1.4 cm (image 32, series 4), similar. Minimal more anterior left lower lobe nodularity is felt to be similar (image 30, series 4). Minimal right upper lobe nodularity on image 18 is similar. Musculoskeletal: No acute osseous abnormality. CT ABDOMEN PELVIS FINDINGS Hepatobiliary: Hepatic cysts. A too small to characterize lateral segment left liver lobe lesion is likely similar (image 62, series 2). Normal gallbladder, without biliary ductal dilatation. Pancreas: Normal, without mass or ductal dilatation. Spleen: Normal in size, without focal abnormality. Adrenals/Urinary Tract: Normal adrenal glands. Normal kidneys, without hydronephrosis. Normal urinary bladder. Stomach/Bowel: Decompressed gastric cardia and body. Scattered colonic diverticula. Normal terminal ileum and appendix. Normal small bowel. Vascular/Lymphatic: Advanced aortic and branch vessel atherosclerosis. No retroperitoneal or retrocrural adenopathy. A left common iliac node measures 8 mm (image 84, series 2), similar. No pelvic adenopathy. Reproductive: Hysterectomy.  No adnexal mass. Other: No significant free fluid. No evidence of omental or peritoneal disease. Musculoskeletal: Osteopenia. Dominant lytic lesion involving the left iliac wing and surrounding soft tissues. 10.8 x 4.0 cm  (image 90, series 2), 11.7 x 6.8 cm on the prior exam. No new osseous lesion identified. IMPRESSION: CT CHEST IMPRESSION 1. Response to therapy of thoracic nodes. 2. Similar mild pulmonary nodularity. CT ABDOMEN AND PELVIS IMPRESSION 1. Decreased size of the dominant left iliac wing metastasis. 2. No extraosseous metastasis within the abdomen or pelvis. Electronically Signed   By: KAbigail MiyamotoM.D.   On: 10/21/2015 16:05   Nm Bone Scan Whole Body  10/21/2015  CLINICAL DATA:  History of metastatic lung carcinoma, left hip pain,  no recent trauma EXAM: NUCLEAR MEDICINE WHOLE BODY BONE SCAN TECHNIQUE: Whole body anterior and posterior images were obtained approximately 3 hours after intravenous injection of radiopharmaceutical. RADIOPHARMACEUTICALS:  27.1 mCi Technetium-53mMDP IV COMPARISON:  PET-CT of 07/01/2015 FINDINGS: The patient was injected with 217.0millicuries of technetium 933M MDP intravenously and a total body bone scan was performed. Compared to the prior PET-CT, the previously described focus of metastatic involvement of the left iliac bone and wing shows increased activity on bone scan consistent with a large metastatic bone lesion. No additional focus of increased activity is seen throughout the skeleton. IMPRESSION: Single large metastatic lesion involving the left iliac bone and wing as noted on recent PET-CT. No other abnormality is seen. Electronically Signed   By: PIvar DrapeM.D.   On: 10/21/2015 16:21   Ct Abdomen Pelvis W Contrast  10/21/2015  CLINICAL DATA:  lung cancer with bone metastasis diagnosed in July. Chemotherapy ongoing. Gastroesophageal reflux disease. Hernia. EXAM: CT CHEST, ABDOMEN, AND PELVIS WITH CONTRAST TECHNIQUE: Multidetector CT imaging of the chest, abdomen and pelvis was performed following the standard protocol during bolus administration of intravenous contrast. CONTRAST:  1032mOMNIPAQUE IOHEXOL 300 MG/ML  SOLN COMPARISON:  08/20/2015 FINDINGS: CT CHEST FINDINGS  Mediastinum/Nodes: No supraclavicular adenopathy. right Port-A-Cath terminates at the mid to low SVC. No axillary adenopathy. Aortic and branch vessel atherosclerosis. Mild cardiomegaly with lipomatous hypertrophy of the interatrial septum. No central pulmonary embolism, on this non-dedicated study. Previously described 14 mm precarinal node is decreased to resolved. No new mediastinal adenopathy. A left mediastinal/ periesophageal 13 mm node on image 35 of the prior exam is also resolved. No hilar adenopathy. Lungs/Pleura: No pleural fluid. Secretions in the inferior aspect of the trachea. Mild centrilobular emphysema. Right base scarring. Index right lower lobe pulmonary nodule measures 6 mm (image 38, series 4), similar. Linear scar-like opacity in the left lower lobe measures 1.4 cm (image 32, series 4), similar. Minimal more anterior left lower lobe nodularity is felt to be similar (image 30, series 4). Minimal right upper lobe nodularity on image 18 is similar. Musculoskeletal: No acute osseous abnormality. CT ABDOMEN PELVIS FINDINGS Hepatobiliary: Hepatic cysts. A too small to characterize lateral segment left liver lobe lesion is likely similar (image 62, series 2). Normal gallbladder, without biliary ductal dilatation. Pancreas: Normal, without mass or ductal dilatation. Spleen: Normal in size, without focal abnormality. Adrenals/Urinary Tract: Normal adrenal glands. Normal kidneys, without hydronephrosis. Normal urinary bladder. Stomach/Bowel: Decompressed gastric cardia and body. Scattered colonic diverticula. Normal terminal ileum and appendix. Normal small bowel. Vascular/Lymphatic: Advanced aortic and branch vessel atherosclerosis. No retroperitoneal or retrocrural adenopathy. A left common iliac node measures 8 mm (image 84, series 2), similar. No pelvic adenopathy. Reproductive: Hysterectomy.  No adnexal mass. Other: No significant free fluid. No evidence of omental or peritoneal disease.  Musculoskeletal: Osteopenia. Dominant lytic lesion involving the left iliac wing and surrounding soft tissues. 10.8 x 4.0 cm (image 90, series 2), 11.7 x 6.8 cm on the prior exam. No new osseous lesion identified. IMPRESSION: CT CHEST IMPRESSION 1. Response to therapy of thoracic nodes. 2. Similar mild pulmonary nodularity. CT ABDOMEN AND PELVIS IMPRESSION 1. Decreased size of the dominant left iliac wing metastasis. 2. No extraosseous metastasis within the abdomen or pelvis. Electronically Signed   By: KyAbigail Miyamoto.D.   On: 10/21/2015 16:05    ASSESSMENT & PLAN:    #1 Metastatic poorly differentiated carcinoma with likely lung primary [non-small cell lung cancer].  CT of  the head with and without contrast showed no evidence of metastatic disease. Patient notes much improved pain control. EGFR blood test mutation analysis negative. Patient's pain is much better controlled after radiation for the painful ilium met.  CT chest abdomen pelvis on 08/20/2015 showed improvement in the mediastinal adenopathy, stable lung nodules and some increase in the size of her left ilial lesion.  The increase in the left ilial lesion does not appear to be a true progression since the patient's clinical symptoms have much improved after XRT it might suggest a post CT pre XRT increase in size or pseudo-progression due to tumor infiltrating immune cells.  Patient is currently status post XRT to her left ilial lesion for palliative control of her symptoms. She has received 5 cycles of Nivolumab. He has tolerated treatment without any prohibitive toxicities.  CT chest abdomen pelvis and bone scan 10/21/2015. shows good response to treatment with no evidence of disease progression.  Plan -Labs are stable today and the patient is appropriate to proceed with her 6th cycle of IV Nivolumab -plan to continue Nivolumab until disease progression or prohibitive toxicities arise. -continue Marchelle Folks  - we will see  patient back prior to the 7 cycle of Nivolumab with CT Chest/abd/pelvis and bone scan -Continue current pain management.   #2 Anxiety and depression related to her life-threatening diagnosis of cancer . Patient did not tolerate Remeron due to nightmares  Patient overall is in a much better place now with decrease anxiety and improved sleep. Has been eating well with improvement in her weight. . Wt Readings from Last 3 Encounters:  10/27/15 128 lb 8 oz (58.287 kg)  10/22/15 126 lb 11.2 oz (57.471 kg)  10/07/15 124 lb 9.6 oz (56.518 kg)   plan -Continue on Celexa 20 mg by mouth daily    #3 Barrett's esophagus 4cms in the distal esophagus with low and high-grade dysplasia cannot rule out an early intra-mucosal esophageal adenocarcinoma. Plan -Patient being monitored by Dr. [name] from GI. -Given metastatic lung cancer is stable and has been response to treatment it would be reasonable to pursue definitive treatments of the patient's intramucosal esophageal adenocarcinoma which apparently would need to be done at [facility].  All questions were answered. The patient knows to call the clinic with any problems, questions or concerns.  I spent 15 minutes counseling the patient face to face, discussing her new treatments and potential adverse effects and answering all the patients questions. The total time spent in the appointment was 15 minutes and more than 50% was on counseling.  Sullivan Lone MD [facility]  (Office): [phone] (Work cell): [phone] (Fax): [phone]

## 2015-11-05 ENCOUNTER — Other Ambulatory Visit (HOSPITAL_BASED_OUTPATIENT_CLINIC_OR_DEPARTMENT_OTHER): Payer: Medicare Other

## 2015-11-05 ENCOUNTER — Ambulatory Visit: Payer: Medicare Other

## 2015-11-05 ENCOUNTER — Ambulatory Visit (HOSPITAL_BASED_OUTPATIENT_CLINIC_OR_DEPARTMENT_OTHER): Payer: Medicare Other

## 2015-11-05 VITALS — BP 116/54 | HR 57 | Temp 98.1°F

## 2015-11-05 DIAGNOSIS — Z95828 Presence of other vascular implants and grafts: Secondary | ICD-10-CM

## 2015-11-05 DIAGNOSIS — C3490 Malignant neoplasm of unspecified part of unspecified bronchus or lung: Secondary | ICD-10-CM | POA: Diagnosis not present

## 2015-11-05 DIAGNOSIS — C7951 Secondary malignant neoplasm of bone: Secondary | ICD-10-CM | POA: Diagnosis not present

## 2015-11-05 DIAGNOSIS — Z5112 Encounter for antineoplastic immunotherapy: Secondary | ICD-10-CM | POA: Diagnosis not present

## 2015-11-05 LAB — CBC & DIFF AND RETIC
BASO%: 1.1 % (ref 0.0–2.0)
Basophils Absolute: 0.1 10*3/uL (ref 0.0–0.1)
EOS ABS: 0.2 10*3/uL (ref 0.0–0.5)
EOS%: 2.3 % (ref 0.0–7.0)
HEMATOCRIT: 37.6 % (ref 34.8–46.6)
HGB: 12 g/dL (ref 11.6–15.9)
IMMATURE RETIC FRACT: 2.3 % (ref 1.60–10.00)
LYMPH#: 1.2 10*3/uL (ref 0.9–3.3)
LYMPH%: 14.7 % (ref 14.0–49.7)
MCH: 28.2 pg (ref 25.1–34.0)
MCHC: 31.9 g/dL (ref 31.5–36.0)
MCV: 88.3 fL (ref 79.5–101.0)
MONO#: 0.6 10*3/uL (ref 0.1–0.9)
MONO%: 7.7 % (ref 0.0–14.0)
NEUT%: 74.2 % (ref 38.4–76.8)
NEUTROS ABS: 6 10*3/uL (ref 1.5–6.5)
Platelets: 196 10*3/uL (ref 145–400)
RBC: 4.26 10*6/uL (ref 3.70–5.45)
RDW: 15.4 % — ABNORMAL HIGH (ref 11.2–14.5)
RETIC %: 0.71 % (ref 0.70–2.10)
RETIC CT ABS: 30.25 10*3/uL — AB (ref 33.70–90.70)
WBC: 8.2 10*3/uL (ref 3.9–10.3)
nRBC: 0 % (ref 0–0)

## 2015-11-05 LAB — COMPREHENSIVE METABOLIC PANEL (CC13)
ALT: 9 U/L (ref 0–55)
AST: 18 U/L (ref 5–34)
Albumin: 3.2 g/dL — ABNORMAL LOW (ref 3.5–5.0)
Alkaline Phosphatase: 84 U/L (ref 40–150)
Anion Gap: 6 mEq/L (ref 3–11)
BILIRUBIN TOTAL: 0.31 mg/dL (ref 0.20–1.20)
BUN: 14.5 mg/dL (ref 7.0–26.0)
CHLORIDE: 106 meq/L (ref 98–109)
CO2: 24 meq/L (ref 22–29)
CREATININE: 0.7 mg/dL (ref 0.6–1.1)
Calcium: 8.7 mg/dL (ref 8.4–10.4)
EGFR: 80 mL/min/{1.73_m2} — ABNORMAL LOW (ref >90)
Glucose: 90 mg/dl (ref 70–140)
Potassium: 3.8 mEq/L (ref 3.5–5.1)
Sodium: 137 mEq/L (ref 136–145)
TOTAL PROTEIN: 7.2 g/dL (ref 6.4–8.3)

## 2015-11-05 MED ORDER — DIPHENHYDRAMINE HCL 50 MG/ML IJ SOLN
INTRAMUSCULAR | Status: AC
  Filled 2015-11-05: qty 1

## 2015-11-05 MED ORDER — SODIUM CHLORIDE 0.9 % IJ SOLN
10.0000 mL | INTRAMUSCULAR | Status: DC | PRN
  Administered 2015-11-05: 10 mL
  Filled 2015-11-05: qty 10

## 2015-11-05 MED ORDER — FAMOTIDINE IN NACL 20-0.9 MG/50ML-% IV SOLN
20.0000 mg | Freq: Once | INTRAVENOUS | Status: AC
  Administered 2015-11-05: 20 mg via INTRAVENOUS

## 2015-11-05 MED ORDER — DIPHENHYDRAMINE HCL 50 MG/ML IJ SOLN
25.0000 mg | Freq: Once | INTRAMUSCULAR | Status: AC
  Administered 2015-11-05: 25 mg via INTRAVENOUS

## 2015-11-05 MED ORDER — SODIUM CHLORIDE 0.9 % IJ SOLN
10.0000 mL | INTRAMUSCULAR | Status: DC | PRN
  Administered 2015-11-05: 10 mL via INTRAVENOUS
  Filled 2015-11-05: qty 10

## 2015-11-05 MED ORDER — SODIUM CHLORIDE 0.9 % IV SOLN
240.0000 mg | Freq: Once | INTRAVENOUS | Status: AC
  Administered 2015-11-05: 240 mg via INTRAVENOUS
  Filled 2015-11-05: qty 24

## 2015-11-05 MED ORDER — FAMOTIDINE IN NACL 20-0.9 MG/50ML-% IV SOLN
INTRAVENOUS | Status: AC
  Filled 2015-11-05: qty 50

## 2015-11-05 MED ORDER — HEPARIN SOD (PORK) LOCK FLUSH 100 UNIT/ML IV SOLN
500.0000 [IU] | Freq: Once | INTRAVENOUS | Status: AC | PRN
  Administered 2015-11-05: 500 [IU]
  Filled 2015-11-05: qty 5

## 2015-11-05 MED ORDER — DENOSUMAB 120 MG/1.7ML ~~LOC~~ SOLN
120.0000 mg | Freq: Once | SUBCUTANEOUS | Status: AC
  Administered 2015-11-05: 120 mg via SUBCUTANEOUS
  Filled 2015-11-05: qty 1.7

## 2015-11-05 MED ORDER — SODIUM CHLORIDE 0.9 % IV SOLN
Freq: Once | INTRAVENOUS | Status: AC
  Administered 2015-11-05: 14:00:00 via INTRAVENOUS

## 2015-11-05 NOTE — Progress Notes (Signed)
Patient complains of a productive cough. Patient states her cough started one week ago. Patient states she doesn't always have production with her cough. Patient states the sputum is clear. Patient denies chills or fever. Selena Lesser, NP notified. Ok to treat per Selena Lesser, NP. Patient verbalized understanding she is to call our office for change in color of sputum such as yellow or green, fever, chills or increased SOB, or any other concerning symptoms.

## 2015-11-05 NOTE — Patient Instructions (Signed)
[facility] Discharge Instructions for Patients Receiving Chemotherapy  Today you received the following chemotherapy agents Nivolumab.  To help prevent nausea and vomiting after your treatment, we encourage you to take your nausea medication as prescribed.   If you develop nausea and vomiting that is not controlled by your nausea medication, call the clinic.   BELOW ARE SYMPTOMS THAT SHOULD BE REPORTED IMMEDIATELY:  *FEVER GREATER THAN 100.5 F  *CHILLS WITH OR WITHOUT FEVER  NAUSEA AND VOMITING THAT IS NOT CONTROLLED WITH YOUR NAUSEA MEDICATION  *UNUSUAL SHORTNESS OF BREATH  *UNUSUAL BRUISING OR BLEEDING  TENDERNESS IN MOUTH AND THROAT WITH OR WITHOUT PRESENCE OF ULCERS  *URINARY PROBLEMS  *BOWEL PROBLEMS  UNUSUAL RASH Items with * indicate a potential emergency and should be followed up as soon as possible.  Feel free to call the clinic you have any questions or concerns. The clinic phone number is [phone].  Please show the CHEMO ALERT CARD at check-in to the Emergency Department and triage nurse.   

## 2015-11-05 NOTE — Patient Instructions (Signed)

## 2015-11-14 ENCOUNTER — Other Ambulatory Visit: Payer: Self-pay | Admitting: Hematology

## 2015-11-19 ENCOUNTER — Ambulatory Visit: Payer: Medicare Other

## 2015-11-19 ENCOUNTER — Ambulatory Visit (HOSPITAL_BASED_OUTPATIENT_CLINIC_OR_DEPARTMENT_OTHER): Payer: Medicare Other | Admitting: Hematology

## 2015-11-19 ENCOUNTER — Ambulatory Visit (HOSPITAL_BASED_OUTPATIENT_CLINIC_OR_DEPARTMENT_OTHER): Payer: Medicare Other

## 2015-11-19 ENCOUNTER — Encounter: Payer: Self-pay | Admitting: Hematology

## 2015-11-19 ENCOUNTER — Other Ambulatory Visit (HOSPITAL_BASED_OUTPATIENT_CLINIC_OR_DEPARTMENT_OTHER): Payer: Medicare Other

## 2015-11-19 ENCOUNTER — Telehealth: Payer: Self-pay | Admitting: Hematology

## 2015-11-19 VITALS — BP 127/59 | HR 77 | Temp 98.0°F | Resp 18 | Ht 65.75 in | Wt 123.1 lb

## 2015-11-19 DIAGNOSIS — F419 Anxiety disorder, unspecified: Secondary | ICD-10-CM | POA: Diagnosis not present

## 2015-11-19 DIAGNOSIS — C3490 Malignant neoplasm of unspecified part of unspecified bronchus or lung: Secondary | ICD-10-CM

## 2015-11-19 DIAGNOSIS — Z5112 Encounter for antineoplastic immunotherapy: Secondary | ICD-10-CM | POA: Diagnosis not present

## 2015-11-19 DIAGNOSIS — J40 Bronchitis, not specified as acute or chronic: Secondary | ICD-10-CM

## 2015-11-19 DIAGNOSIS — C7951 Secondary malignant neoplasm of bone: Secondary | ICD-10-CM

## 2015-11-19 DIAGNOSIS — H04123 Dry eye syndrome of bilateral lacrimal glands: Secondary | ICD-10-CM

## 2015-11-19 DIAGNOSIS — K22711 Barrett's esophagus with high grade dysplasia: Secondary | ICD-10-CM | POA: Diagnosis not present

## 2015-11-19 DIAGNOSIS — Z95828 Presence of other vascular implants and grafts: Secondary | ICD-10-CM

## 2015-11-19 LAB — CBC & DIFF AND RETIC
BASO%: 0.8 % (ref 0.0–2.0)
BASOS ABS: 0.1 10*3/uL (ref 0.0–0.1)
EOS ABS: 0.2 10*3/uL (ref 0.0–0.5)
EOS%: 4 % (ref 0.0–7.0)
HEMATOCRIT: 39.8 % (ref 34.8–46.6)
HGB: 12.9 g/dL (ref 11.6–15.9)
IMMATURE RETIC FRACT: 3.3 % (ref 1.60–10.00)
LYMPH#: 1 10*3/uL (ref 0.9–3.3)
LYMPH%: 17.1 % (ref 14.0–49.7)
MCH: 28.4 pg (ref 25.1–34.0)
MCHC: 32.4 g/dL (ref 31.5–36.0)
MCV: 87.5 fL (ref 79.5–101.0)
MONO#: 0.5 10*3/uL (ref 0.1–0.9)
MONO%: 8.9 % (ref 0.0–14.0)
NEUT#: 4.1 10*3/uL (ref 1.5–6.5)
NEUT%: 69.2 % (ref 38.4–76.8)
Platelets: 181 10*3/uL (ref 145–400)
RBC: 4.55 10*6/uL (ref 3.70–5.45)
RDW: 15.7 % — ABNORMAL HIGH (ref 11.2–14.5)
RETIC %: 0.96 % (ref 0.70–2.10)
RETIC CT ABS: 43.68 10*3/uL (ref 33.70–90.70)
WBC: 6 10*3/uL (ref 3.9–10.3)
nRBC: 0 % (ref 0–0)

## 2015-11-19 LAB — COMPREHENSIVE METABOLIC PANEL
ALT: 10 U/L (ref 0–55)
AST: 16 U/L (ref 5–34)
Albumin: 3.3 g/dL — ABNORMAL LOW (ref 3.5–5.0)
Alkaline Phosphatase: 82 U/L (ref 40–150)
Anion Gap: 9 mEq/L (ref 3–11)
BILIRUBIN TOTAL: 0.33 mg/dL (ref 0.20–1.20)
BUN: 13.8 mg/dL (ref 7.0–26.0)
CALCIUM: 9 mg/dL (ref 8.4–10.4)
CHLORIDE: 106 meq/L (ref 98–109)
CO2: 24 meq/L (ref 22–29)
CREATININE: 0.8 mg/dL (ref 0.6–1.1)
EGFR: 71 mL/min/{1.73_m2} — ABNORMAL LOW (ref >90)
Glucose: 124 mg/dl (ref 70–140)
Potassium: 3.3 mEq/L — ABNORMAL LOW (ref 3.5–5.1)
Sodium: 139 mEq/L (ref 136–145)
TOTAL PROTEIN: 7.3 g/dL (ref 6.4–8.3)

## 2015-11-19 MED ORDER — DIPHENHYDRAMINE HCL 50 MG/ML IJ SOLN
INTRAMUSCULAR | Status: AC
  Filled 2015-11-19: qty 1

## 2015-11-19 MED ORDER — FAMOTIDINE IN NACL 20-0.9 MG/50ML-% IV SOLN
INTRAVENOUS | Status: AC
  Filled 2015-11-19: qty 50

## 2015-11-19 MED ORDER — SODIUM CHLORIDE 0.9 % IJ SOLN
10.0000 mL | INTRAMUSCULAR | Status: DC | PRN
  Administered 2015-11-19: 10 mL via INTRAVENOUS
  Filled 2015-11-19: qty 10

## 2015-11-19 MED ORDER — DIPHENHYDRAMINE HCL 50 MG/ML IJ SOLN
25.0000 mg | Freq: Once | INTRAMUSCULAR | Status: AC
  Administered 2015-11-19: 25 mg via INTRAVENOUS

## 2015-11-19 MED ORDER — ARTIFICIAL TEARS OP OINT: TOPICAL_OINTMENT | Freq: Every day | OPHTHALMIC | Status: DC

## 2015-11-19 MED ORDER — HEPARIN SOD (PORK) LOCK FLUSH 100 UNIT/ML IV SOLN
500.0000 [IU] | Freq: Once | INTRAVENOUS | Status: AC | PRN
  Administered 2015-11-19: 500 [IU]
  Filled 2015-11-19: qty 5

## 2015-11-19 MED ORDER — ONDANSETRON 4 MG PO TBDP: 4.0000 mg | ORAL_TABLET | Freq: Three times a day (TID) | ORAL | Status: DC | PRN

## 2015-11-19 MED ORDER — SODIUM CHLORIDE 0.9 % IV SOLN
240.0000 mg | Freq: Once | INTRAVENOUS | Status: AC
  Administered 2015-11-19: 240 mg via INTRAVENOUS
  Filled 2015-11-19: qty 20

## 2015-11-19 MED ORDER — FAMOTIDINE IN NACL 20-0.9 MG/50ML-% IV SOLN
20.0000 mg | Freq: Once | INTRAVENOUS | Status: AC
  Administered 2015-11-19: 20 mg via INTRAVENOUS

## 2015-11-19 MED ORDER — HYPROMELLOSE 0.4 % OP SOLN: 1.0000 [drp] | Freq: Four times a day (QID) | OPHTHALMIC | Status: DC

## 2015-11-19 MED ORDER — AZITHROMYCIN 250 MG PO TABS: ORAL_TABLET | ORAL | Status: DC

## 2015-11-19 MED ORDER — SODIUM CHLORIDE 0.9 % IJ SOLN
10.0000 mL | INTRAMUSCULAR | Status: DC | PRN
  Administered 2015-11-19: 10 mL
  Filled 2015-11-19: qty 10

## 2015-11-19 MED ORDER — SODIUM CHLORIDE 0.9 % IV SOLN
Freq: Once | INTRAVENOUS | Status: AC
  Administered 2015-11-19: 11:00:00 via INTRAVENOUS

## 2015-11-19 NOTE — Progress Notes (Signed)
Cindy Kitchen    HEMATOLOGY ONCOLOGY PROGRESS NOTE  Date of service: .11/19/2015  Patient Care Team: Tamsen Roers, MD as PCP - General (Family Medicine) [name], MD as Consulting Physician (Hematology and Oncology)  CHIEF COMPLAINTS/PURPOSE OF CONSULTATION: Follow-up for metastatic lung cancer  DIAGNOSIS:   #1 Metastatic non-small cell lung cancer with bilateral lung nodules and large metastatic lesion in the left Ilium. #2 Barrett's esophagus with some evidence of intramucosal adenocarcinoma of the esophagus.  Treatment  1 Palliative radiation therapy to the large left ilium metastases 2. IV Nivolumab x 7cycles 3. Xgeva 191m [place] q4weeks for bone metastases.   HISTORY OF PRESENTING ILLNESS: (plz see my previous consultation for details of initial presentation)  INTERVAL HISTORY  [name] here for her scheduled follow-up prior to the eight cycle of Nivolumab. She notes that she is feeling well and has been eating better. Notes improved energy levels and a more positive outlook, Has been ambulating much more and is able to walk around the department store shopping. No other acute new symptoms. Is grateful for all her cares. Received paperwork for a disability parking tag.  MEDICAL HISTORY:  Past Medical History  Diagnosis Date  . IBS (irritable bowel syndrome)   . Bone neoplasm 06/24/2015  . GERD (gastroesophageal reflux disease)   . Vitamin D deficiency disease   . Cigarette smoker two packs a day or less     Currently still smoking 2 PPD - Not interested in quitting at this time.  . Colon polyps     hyperplastic, tubular adenomas, tubulovillous adenoma  . Endometriosis     Hysterectomy with BSO at age [age]yrs  . H/O: pneumonia   . Heavy smoker (more than 20 cigarettes per day) 06/24/2015  . Depression 06/24/2015  . Hypertension 06/24/2015    likely improved incidental to 40 lbs weight loss from her neoplasm. No Longer taking med for this as of 08-06-15  . Cancer ([facility]      metastatic poorly differentiated carcinoma. tumor left groin surgical removal with radiation tx.  . Cough, persistent     hx. lung cancer ? primary-being evaluated, unsure of primary site.  . Pain     left hip-persistent"tumor of bone"-radiation tx. 10.  . Swelling of ankle     bilateral  . Pulmonary nodules   . Barrett's esophagus   . Hiatal hernia   . Gastritis   . Esophageal adenocarcinoma ([facility] 08/11/15    intramucosal  . Diverticulosis    SURGICAL HISTORY: Past Surgical History  Procedure Laterality Date  . Abdominal hysterectomy    . Tonsillectomy    . Ganglion cyst excision    . Total abdominal hysterectomy w/ bilateral salpingoophorectomy  at age [age]yrs    For endometriosis  . Knee arthroscopy  age about 89yrs  . Bartholin gland cyst excision  74yo ago    Does not want if it was an infected cyst or tumor. Was soon as delivery  . Colonoscopy w/ polypectomy      multiple times - last done 09/2014 per patient.  . Esophagogastroduodenoscopy (egd) with propofol N/A 08/11/2015    Procedure: ESOPHAGOGASTRODUODENOSCOPY (EGD) WITH PROPOFOL;  Surgeon: JJerene Bears MD;  Location: [facility];  Service: Gastroenterology;  Laterality: N/A;    SOCIAL HISTORY: Social History   Social History  . Marital Status: Widowed    Spouse Name: N/A  . Number of Children: 2  . Years of Education: N/A   Occupational History  .  Not on file.   Social History Main Topics  . Smoking status: Current Every Day Smoker -- 1.00 packs/day for 60 years    Types: Cigarettes  . Smokeless tobacco: Never Used  . Alcohol Use: Yes     Comment: occassional   . Drug Use: No  . Sexual Activity: No   Other Topics Concern  . Not on file   Social History Narrative    FAMILY HISTORY: Family History  Problem Relation Age of Onset  . Stroke Mother   . Colon cancer Father   . Colon cancer Brother   . Colon cancer Brother   . Breast cancer Daughter 45    ER/PR+ stage II    ALLERGIES:  is  allergic to penicillins and latex. patient wonders if she has a penicillin allergy but notes that she is uncertain about this.  MEDICATIONS:  Current Outpatient Prescriptions  Medication Sig Dispense Refill  . amLODipine (NORVASC) 5 MG tablet     . citalopram (CELEXA) 20 MG tablet Take 1 tablet (20 mg total) by mouth daily. 30 tablet 2  . citalopram (CELEXA) 20 MG tablet TAKE 1 TABLET (20 MG TOTAL) BY MOUTH DAILY. 30 tablet 1  . docusate sodium (COLACE) 100 MG capsule Take 100 mg by mouth daily as needed for mild constipation.    . lidocaine-prilocaine (EMLA) cream Apply small amount over port 1-2 hours prior to treatment, cover with plastic wrap (DO NOT RUB IN). 30 g prn  . LORazepam (ATIVAN) 1 MG tablet Take 1 tablet (1 mg total) by mouth every 8 (eight) hours as needed for anxiety (or nausea). 50 tablet 0  . omeprazole (PRILOSEC) 40 MG capsule Take 1 capsule (40 mg total) by mouth daily. 30 capsule 2  . oxyCODONE (OXY IR/ROXICODONE) 5 MG immediate release tablet Take 1 tablet (5 mg total) by mouth every 4 (four) hours as needed for severe pain. 60 tablet 0  . OxyCODONE (OXYCONTIN) 10 mg T12A 12 hr tablet Take 1 tablet (10 mg total) by mouth every 12 (twelve) hours. 60 tablet 0  . PRESCRIPTION MEDICATION Antibody Plan [facility]    . artificial tears (LACRILUBE) OINT ophthalmic ointment Place into both eyes at bedtime. 1 Tube 1  . azithromycin (ZITHROMAX) 250 MG tablet 528m orally on day1 then 2555mpo daily x 4days 6 each 0  . Hypromellose (ARTIFICIAL TEARS) 0.4 % SOLN Apply 1 drop to eye QID. 1 Bottle 1  . ondansetron (ZOFRAN-ODT) 4 MG disintegrating tablet Take 1 tablet (4 mg total) by mouth every 8 (eight) hours as needed for nausea or vomiting. 30 tablet 0   No current facility-administered medications for this visit.    REVIEW OF SYSTEMS:    10 point review of systems done and was noted to be negative except as noted above.  PHYSICAL EXAMINATION: ECOG PERFORMANCE STATUS: 2 -  Symptomatic, <50% confined to bed  Filed Vitals:   11/19/15 0911  BP: 127/59  Pulse: 77  Temp: 98 F (36.7 C)  Resp: 18   Filed Weights   11/19/15 0911  Weight: 123 lb 1.6 oz (55.838 kg)   GENERAL:alert, pain well controlled. SKIN: skin color, texture, turgor are normal, no rashes or significant lesions EYES: normal, conjunctiva are pink and non-injected, sclera clear OROPHARYNX:no exudate, no erythema and lips, buccal mucosa, and tongue normal  NECK: supple, thyroid normal size, non-tender, without nodularity LYMPH:  no palpable lymphadenopathy in the cervical, axillary or inguinal LUNGS: clear to auscultation and bilateral reduced breath sounds  with normal respiratory effort  HEART: regular rate & rhythm and no murmurs and resolved lower extremity edema ABDOMEN:abdomen soft, non-tender and normal bowel sounds Musculoskeletal: No pedal edema. No calf pain or tenderness.  PSYCH: alert & oriented x 3 with fluent speech NEURO: no focal motor/sensory deficits.  LABORATORY DATA:  I have reviewed the data as listed . CBC Latest Ref Rng 11/19/2015 11/05/2015 10/22/2015  WBC 3.9 - 10.3 10e3/uL 6.0 8.2 9.8  Hemoglobin 11.6 - 15.9 g/dL 12.9 12.0 12.2  Hematocrit 34.8 - 46.6 % 39.8 37.6 38.2  Platelets 145 - 400 10e3/uL 181 196 172   . CMP Latest Ref Rng 11/19/2015 11/05/2015 10/22/2015  Glucose 70 - 140 mg/dl 124 90 99  BUN 7.0 - 26.0 mg/dL 13.8 14.5 20.0  Creatinine 0.6 - 1.1 mg/dL 0.8 0.7 0.9  Sodium 136 - 145 mEq/L 139 137 137  Potassium 3.5 - 5.1 mEq/L 3.3(L) 3.8 4.1  Chloride 101 - 111 mmol/L - - -  CO2 22 - 29 mEq/L 24 24 22   Calcium 8.4 - 10.4 mg/dL 9.0 8.7 8.8  Total Protein 6.4 - 8.3 g/dL 7.3 7.2 6.5  Total Bilirubin 0.20 - 1.20 mg/dL 0.33 0.31 0.44  Alkaline Phos 40 - 150 U/L 82 84 71  AST 5 - 34 U/L 16 18 32  ALT 0 - 55 U/L 10 9 15      RADIOGRAPHIC STUDIES: I have personally reviewed the radiological images as listed and agreed with the findings in the  report. Ct Chest W Contrast  10/21/2015  CLINICAL DATA:  lung cancer with bone metastasis diagnosed in July. Chemotherapy ongoing. Gastroesophageal reflux disease. Hernia. EXAM: CT CHEST, ABDOMEN, AND PELVIS WITH CONTRAST TECHNIQUE: Multidetector CT imaging of the chest, abdomen and pelvis was performed following the standard protocol during bolus administration of intravenous contrast. CONTRAST:  117m OMNIPAQUE IOHEXOL 300 MG/ML  SOLN COMPARISON:  08/20/2015 FINDINGS: CT CHEST FINDINGS Mediastinum/Nodes: No supraclavicular adenopathy. right Port-A-Cath terminates at the mid to low SVC. No axillary adenopathy. Aortic and branch vessel atherosclerosis. Mild cardiomegaly with lipomatous hypertrophy of the interatrial septum. No central pulmonary embolism, on this non-dedicated study. Previously described 14 mm precarinal node is decreased to resolved. No new mediastinal adenopathy. A left mediastinal/ periesophageal 13 mm node on image 35 of the prior exam is also resolved. No hilar adenopathy. Lungs/Pleura: No pleural fluid. Secretions in the inferior aspect of the trachea. Mild centrilobular emphysema. Right base scarring. Index right lower lobe pulmonary nodule measures 6 mm (image 38, series 4), similar. Linear scar-like opacity in the left lower lobe measures 1.4 cm (image 32, series 4), similar. Minimal more anterior left lower lobe nodularity is felt to be similar (image 30, series 4). Minimal right upper lobe nodularity on image 18 is similar. Musculoskeletal: No acute osseous abnormality. CT ABDOMEN PELVIS FINDINGS Hepatobiliary: Hepatic cysts. A too small to characterize lateral segment left liver lobe lesion is likely similar (image 62, series 2). Normal gallbladder, without biliary ductal dilatation. Pancreas: Normal, without mass or ductal dilatation. Spleen: Normal in size, without focal abnormality. Adrenals/Urinary Tract: Normal adrenal glands. Normal kidneys, without hydronephrosis. Normal urinary  bladder. Stomach/Bowel: Decompressed gastric cardia and body. Scattered colonic diverticula. Normal terminal ileum and appendix. Normal small bowel. Vascular/Lymphatic: Advanced aortic and branch vessel atherosclerosis. No retroperitoneal or retrocrural adenopathy. A left common iliac node measures 8 mm (image 84, series 2), similar. No pelvic adenopathy. Reproductive: Hysterectomy.  No adnexal mass. Other: No significant free fluid. No evidence of omental or peritoneal disease.  Musculoskeletal: Osteopenia. Dominant lytic lesion involving the left iliac wing and surrounding soft tissues. 10.8 x 4.0 cm (image 90, series 2), 11.7 x 6.8 cm on the prior exam. No new osseous lesion identified. IMPRESSION: CT CHEST IMPRESSION 1. Response to therapy of thoracic nodes. 2. Similar mild pulmonary nodularity. CT ABDOMEN AND PELVIS IMPRESSION 1. Decreased size of the dominant left iliac wing metastasis. 2. No extraosseous metastasis within the abdomen or pelvis. Electronically Signed   By: Abigail Miyamoto M.D.   On: 10/21/2015 16:05   Nm Bone Scan Whole Body  10/21/2015  CLINICAL DATA:  History of metastatic lung carcinoma, left hip pain, no recent trauma EXAM: NUCLEAR MEDICINE WHOLE BODY BONE SCAN TECHNIQUE: Whole body anterior and posterior images were obtained approximately 3 hours after intravenous injection of radiopharmaceutical. RADIOPHARMACEUTICALS:  27.1 mCi Technetium-55mMDP IV COMPARISON:  PET-CT of 07/01/2015 FINDINGS: The patient was injected with 226.3millicuries of technetium 936M MDP intravenously and a total body bone scan was performed. Compared to the prior PET-CT, the previously described focus of metastatic involvement of the left iliac bone and wing shows increased activity on bone scan consistent with a large metastatic bone lesion. No additional focus of increased activity is seen throughout the skeleton. IMPRESSION: Single large metastatic lesion involving the left iliac bone and wing as noted on  recent PET-CT. No other abnormality is seen. Electronically Signed   By: PIvar DrapeM.D.   On: 10/21/2015 16:21   Ct Abdomen Pelvis W Contrast  10/21/2015  CLINICAL DATA:  lung cancer with bone metastasis diagnosed in July. Chemotherapy ongoing. Gastroesophageal reflux disease. Hernia. EXAM: CT CHEST, ABDOMEN, AND PELVIS WITH CONTRAST TECHNIQUE: Multidetector CT imaging of the chest, abdomen and pelvis was performed following the standard protocol during bolus administration of intravenous contrast. CONTRAST:  1054mOMNIPAQUE IOHEXOL 300 MG/ML  SOLN COMPARISON:  08/20/2015 FINDINGS: CT CHEST FINDINGS Mediastinum/Nodes: No supraclavicular adenopathy. right Port-A-Cath terminates at the mid to low SVC. No axillary adenopathy. Aortic and branch vessel atherosclerosis. Mild cardiomegaly with lipomatous hypertrophy of the interatrial septum. No central pulmonary embolism, on this non-dedicated study. Previously described 14 mm precarinal node is decreased to resolved. No new mediastinal adenopathy. A left mediastinal/ periesophageal 13 mm node on image 35 of the prior exam is also resolved. No hilar adenopathy. Lungs/Pleura: No pleural fluid. Secretions in the inferior aspect of the trachea. Mild centrilobular emphysema. Right base scarring. Index right lower lobe pulmonary nodule measures 6 mm (image 38, series 4), similar. Linear scar-like opacity in the left lower lobe measures 1.4 cm (image 32, series 4), similar. Minimal more anterior left lower lobe nodularity is felt to be similar (image 30, series 4). Minimal right upper lobe nodularity on image 18 is similar. Musculoskeletal: No acute osseous abnormality. CT ABDOMEN PELVIS FINDINGS Hepatobiliary: Hepatic cysts. A too small to characterize lateral segment left liver lobe lesion is likely similar (image 62, series 2). Normal gallbladder, without biliary ductal dilatation. Pancreas: Normal, without mass or ductal dilatation. Spleen: Normal in size, without  focal abnormality. Adrenals/Urinary Tract: Normal adrenal glands. Normal kidneys, without hydronephrosis. Normal urinary bladder. Stomach/Bowel: Decompressed gastric cardia and body. Scattered colonic diverticula. Normal terminal ileum and appendix. Normal small bowel. Vascular/Lymphatic: Advanced aortic and branch vessel atherosclerosis. No retroperitoneal or retrocrural adenopathy. A left common iliac node measures 8 mm (image 84, series 2), similar. No pelvic adenopathy. Reproductive: Hysterectomy.  No adnexal mass. Other: No significant free fluid. No evidence of omental or peritoneal disease. Musculoskeletal: Osteopenia. Dominant lytic  lesion involving the left iliac wing and surrounding soft tissues. 10.8 x 4.0 cm (image 90, series 2), 11.7 x 6.8 cm on the prior exam. No new osseous lesion identified. IMPRESSION: CT CHEST IMPRESSION 1. Response to therapy of thoracic nodes. 2. Similar mild pulmonary nodularity. CT ABDOMEN AND PELVIS IMPRESSION 1. Decreased size of the dominant left iliac wing metastasis. 2. No extraosseous metastasis within the abdomen or pelvis. Electronically Signed   By: Abigail Miyamoto M.D.   On: 10/21/2015 16:05    ASSESSMENT & PLAN:    #1 Metastatic poorly differentiated carcinoma with likely lung primary [non-small cell lung cancer].  CT of the head with and without contrast showed no evidence of metastatic disease. Patient notes much improved pain control. EGFR blood test mutation analysis negative. Patient's pain is much better controlled after radiation for the painful ilium met.  CT chest abdomen pelvis on 08/20/2015 showed improvement in the mediastinal adenopathy, stable lung nodules and some increase in the size of her left ilial lesion.  The increase in the left ilial lesion does not appear to be a true progression since the patient's clinical symptoms have much improved after XRT it might suggest a post CT pre XRT increase in size or pseudo-progression due to tumor  infiltrating immune cells.  Patient is currently status post XRT to her left ilial lesion for palliative control of her symptoms. She has received 5 cycles of Nivolumab. He has tolerated treatment without any prohibitive toxicities.  CT chest abdomen pelvis and bone scan 10/21/2015. shows good response to treatment with no evidence of disease progression.  Plan -Labs are stable today and the patient is appropriate to proceed with her 8th cycle of IV Nivolumab -plan to continue Nivolumab until disease progression or prohibitive toxicities arise. -continue Cindy Byrd  - we will see patient back prior to the 10th cycle of Nivolumab with repeat labs. -We'll plan to repeat arestaging scans after 12 cycles unless new symptoms arise -Continue current pain management.  - rpt TSH prior to cycle 10 for routine monitoring with immuno-therapy. -continue labs q2weeks  #2 Anxiety and depression related to her life-threatening diagnosis of cancer . Patient did not tolerate Remeron due to nightmares  Patient overall is in a much better place now with decrease anxiety and improved sleep. Has been eating well with improvement in her weight. . Wt Readings from Last 3 Encounters:  11/19/15 123 lb 1.6 oz (55.838 kg)  10/27/15 128 lb 8 oz (58.287 kg)  10/22/15 126 lb 11.2 oz (57.471 kg)   plan -Continue on Celexa 20 mg by mouth daily  -encouraged improving po intake. -she notes that she is eating much better but has also been walking a fair amount compared to previously and so is burning more calories.   #3 Barrett's esophagus 4cms in the distal esophagus with low and high-grade dysplasia cannot rule out an early intra-mucosal esophageal adenocarcinoma. Plan -Patient being monitored by Dr. [name] from GI. -setup for f/u for further evaluation and management in feb 2017  All questions were answered. The patient knows to call the clinic with any problems, questions or concerns.  I spent 20  minutes counseling the patient face to face, discussing her new treatments and potential adverse effects and answering all the patients questions. The total time spent in the appointment was 20 minutes and more than 50% was on counseling.  Sullivan Lone MD [place] [facility] Physician [facility]  (Office): [phone] (Work cell): [phone] (Fax): [phone]

## 2015-11-19 NOTE — Patient Instructions (Signed)
[facility] Discharge Instructions for Patients Receiving Chemotherapy  Today you received the following chemotherapy agents Nivolumab.  To help prevent nausea and vomiting after your treatment, we encourage you to take your nausea medication as prescribed.   If you develop nausea and vomiting that is not controlled by your nausea medication, call the clinic.   BELOW ARE SYMPTOMS THAT SHOULD BE REPORTED IMMEDIATELY:  *FEVER GREATER THAN 100.5 F  *CHILLS WITH OR WITHOUT FEVER  NAUSEA AND VOMITING THAT IS NOT CONTROLLED WITH YOUR NAUSEA MEDICATION  *UNUSUAL SHORTNESS OF BREATH  *UNUSUAL BRUISING OR BLEEDING  TENDERNESS IN MOUTH AND THROAT WITH OR WITHOUT PRESENCE OF ULCERS  *URINARY PROBLEMS  *BOWEL PROBLEMS  UNUSUAL RASH Items with * indicate a potential emergency and should be followed up as soon as possible.  Feel free to call the clinic you have any questions or concerns. The clinic phone number is [phone].  Please show the CHEMO ALERT CARD at check-in to the Emergency Department and triage nurse.   

## 2015-11-19 NOTE — Patient Instructions (Signed)

## 2015-11-19 NOTE — Telephone Encounter (Signed)
per pof to sch pt appt-sent MAW email to sch trmt-pt top get updated copy b4 leaving trmt toom

## 2015-12-03 ENCOUNTER — Ambulatory Visit: Payer: Medicare Other

## 2015-12-03 ENCOUNTER — Ambulatory Visit (HOSPITAL_BASED_OUTPATIENT_CLINIC_OR_DEPARTMENT_OTHER): Payer: Medicare Other

## 2015-12-03 ENCOUNTER — Other Ambulatory Visit (HOSPITAL_BASED_OUTPATIENT_CLINIC_OR_DEPARTMENT_OTHER): Payer: Medicare Other

## 2015-12-03 VITALS — BP 126/63 | HR 59 | Temp 97.7°F | Resp 18

## 2015-12-03 DIAGNOSIS — C3490 Malignant neoplasm of unspecified part of unspecified bronchus or lung: Secondary | ICD-10-CM

## 2015-12-03 DIAGNOSIS — C7951 Secondary malignant neoplasm of bone: Secondary | ICD-10-CM | POA: Diagnosis not present

## 2015-12-03 DIAGNOSIS — Z5112 Encounter for antineoplastic immunotherapy: Secondary | ICD-10-CM | POA: Diagnosis not present

## 2015-12-03 DIAGNOSIS — Z95828 Presence of other vascular implants and grafts: Secondary | ICD-10-CM

## 2015-12-03 LAB — CBC & DIFF AND RETIC
BASO%: 0.8 % (ref 0.0–2.0)
BASOS ABS: 0 10*3/uL (ref 0.0–0.1)
EOS ABS: 0.2 10*3/uL (ref 0.0–0.5)
EOS%: 3.3 % (ref 0.0–7.0)
HEMATOCRIT: 37.8 % (ref 34.8–46.6)
HEMOGLOBIN: 12.2 g/dL (ref 11.6–15.9)
IMMATURE RETIC FRACT: 3.2 % (ref 1.60–10.00)
LYMPH%: 16.5 % (ref 14.0–49.7)
MCH: 28.4 pg (ref 25.1–34.0)
MCHC: 32.3 g/dL (ref 31.5–36.0)
MCV: 88.1 fL (ref 79.5–101.0)
MONO#: 0.4 10*3/uL (ref 0.1–0.9)
MONO%: 8.4 % (ref 0.0–14.0)
NEUT#: 3.7 10*3/uL (ref 1.5–6.5)
NEUT%: 71 % (ref 38.4–76.8)
Platelets: 149 10*3/uL (ref 145–400)
RBC: 4.29 10*6/uL (ref 3.70–5.45)
RDW: 15.7 % — ABNORMAL HIGH (ref 11.2–14.5)
Retic %: 0.84 % (ref 0.70–2.10)
Retic Ct Abs: 36.04 10*3/uL (ref 33.70–90.70)
WBC: 5.2 10*3/uL (ref 3.9–10.3)
lymph#: 0.9 10*3/uL (ref 0.9–3.3)

## 2015-12-03 LAB — COMPREHENSIVE METABOLIC PANEL
ALBUMIN: 3.4 g/dL — AB (ref 3.5–5.0)
ALK PHOS: 68 U/L (ref 40–150)
ALT: 11 U/L (ref 0–55)
AST: 22 U/L (ref 5–34)
Anion Gap: 7 mEq/L (ref 3–11)
BILIRUBIN TOTAL: 0.44 mg/dL (ref 0.20–1.20)
BUN: 12.5 mg/dL (ref 7.0–26.0)
CALCIUM: 8.7 mg/dL (ref 8.4–10.4)
CO2: 26 mEq/L (ref 22–29)
Chloride: 108 mEq/L (ref 98–109)
Creatinine: 0.8 mg/dL (ref 0.6–1.1)
EGFR: 75 mL/min/{1.73_m2} — AB (ref >90)
GLUCOSE: 85 mg/dL (ref 70–140)
POTASSIUM: 3.9 meq/L (ref 3.5–5.1)
SODIUM: 140 meq/L (ref 136–145)
TOTAL PROTEIN: 6.9 g/dL (ref 6.4–8.3)

## 2015-12-03 MED ORDER — NIVOLUMAB CHEMO INJECTION 100 MG/10ML
240.0000 mg | Freq: Once | INTRAVENOUS | Status: AC
  Administered 2015-12-03: 240 mg via INTRAVENOUS
  Filled 2015-12-03: qty 24

## 2015-12-03 MED ORDER — DIPHENHYDRAMINE HCL 50 MG/ML IJ SOLN
INTRAMUSCULAR | Status: AC
  Filled 2015-12-03: qty 1

## 2015-12-03 MED ORDER — FAMOTIDINE IN NACL 20-0.9 MG/50ML-% IV SOLN
20.0000 mg | Freq: Once | INTRAVENOUS | Status: AC
  Administered 2015-12-03: 20 mg via INTRAVENOUS

## 2015-12-03 MED ORDER — SODIUM CHLORIDE 0.9 % IV SOLN
Freq: Once | INTRAVENOUS | Status: AC
  Administered 2015-12-03: 12:00:00 via INTRAVENOUS

## 2015-12-03 MED ORDER — SODIUM CHLORIDE 0.9 % IJ SOLN
10.0000 mL | INTRAMUSCULAR | Status: DC | PRN
  Administered 2015-12-03: 10 mL via INTRAVENOUS
  Filled 2015-12-03: qty 10

## 2015-12-03 MED ORDER — DENOSUMAB 120 MG/1.7ML ~~LOC~~ SOLN
120.0000 mg | Freq: Once | SUBCUTANEOUS | Status: AC
Start: 2015-12-03 — End: 2015-12-03
  Administered 2015-12-03: 120 mg via SUBCUTANEOUS
  Filled 2015-12-03: qty 1.7

## 2015-12-03 MED ORDER — DIPHENHYDRAMINE HCL 50 MG/ML IJ SOLN
25.0000 mg | Freq: Once | INTRAMUSCULAR | Status: AC
  Administered 2015-12-03: 25 mg via INTRAVENOUS

## 2015-12-03 MED ORDER — FAMOTIDINE IN NACL 20-0.9 MG/50ML-% IV SOLN
INTRAVENOUS | Status: AC
  Filled 2015-12-03: qty 50

## 2015-12-03 MED ORDER — SODIUM CHLORIDE 0.9 % IJ SOLN
10.0000 mL | INTRAMUSCULAR | Status: DC | PRN
  Administered 2015-12-03: 10 mL
  Filled 2015-12-03: qty 10

## 2015-12-03 MED ORDER — HEPARIN SOD (PORK) LOCK FLUSH 100 UNIT/ML IV SOLN
500.0000 [IU] | Freq: Once | INTRAVENOUS | Status: AC | PRN
  Administered 2015-12-03: 500 [IU]
  Filled 2015-12-03: qty 5

## 2015-12-03 NOTE — Patient Instructions (Signed)
[facility] Discharge Instructions for Patients Receiving Chemotherapy  Today you received the following chemotherapy agents Nivolumab.  To help prevent nausea and vomiting after your treatment, we encourage you to take your nausea medication as prescribed.   If you develop nausea and vomiting that is not controlled by your nausea medication, call the clinic.   BELOW ARE SYMPTOMS THAT SHOULD BE REPORTED IMMEDIATELY:  *FEVER GREATER THAN 100.5 F  *CHILLS WITH OR WITHOUT FEVER  NAUSEA AND VOMITING THAT IS NOT CONTROLLED WITH YOUR NAUSEA MEDICATION  *UNUSUAL SHORTNESS OF BREATH  *UNUSUAL BRUISING OR BLEEDING  TENDERNESS IN MOUTH AND THROAT WITH OR WITHOUT PRESENCE OF ULCERS  *URINARY PROBLEMS  *BOWEL PROBLEMS  UNUSUAL RASH Items with * indicate a potential emergency and should be followed up as soon as possible.  Feel free to call the clinic you have any questions or concerns. The clinic phone number is [phone].  Please show the CHEMO ALERT CARD at check-in to the Emergency Department and triage nurse.   

## 2015-12-03 NOTE — Patient Instructions (Signed)

## 2015-12-06 DIAGNOSIS — K529 Noninfective gastroenteritis and colitis, unspecified: Secondary | ICD-10-CM

## 2015-12-06 HISTORY — DX: Noninfective gastroenteritis and colitis, unspecified: K52.9

## 2015-12-17 ENCOUNTER — Encounter: Payer: Self-pay | Admitting: Hematology

## 2015-12-17 ENCOUNTER — Other Ambulatory Visit: Payer: Medicare Other

## 2015-12-17 ENCOUNTER — Ambulatory Visit (HOSPITAL_BASED_OUTPATIENT_CLINIC_OR_DEPARTMENT_OTHER): Payer: Medicare Other

## 2015-12-17 ENCOUNTER — Other Ambulatory Visit (HOSPITAL_BASED_OUTPATIENT_CLINIC_OR_DEPARTMENT_OTHER): Payer: Medicare Other

## 2015-12-17 ENCOUNTER — Telehealth: Payer: Self-pay | Admitting: *Deleted

## 2015-12-17 ENCOUNTER — Telehealth: Payer: Self-pay | Admitting: Hematology

## 2015-12-17 ENCOUNTER — Ambulatory Visit (HOSPITAL_BASED_OUTPATIENT_CLINIC_OR_DEPARTMENT_OTHER): Payer: Medicare Other | Admitting: Hematology

## 2015-12-17 VITALS — BP 164/64 | HR 58 | Temp 97.5°F | Resp 17 | Ht 67.5 in | Wt 130.2 lb

## 2015-12-17 DIAGNOSIS — C3490 Malignant neoplasm of unspecified part of unspecified bronchus or lung: Secondary | ICD-10-CM | POA: Diagnosis not present

## 2015-12-17 DIAGNOSIS — G893 Neoplasm related pain (acute) (chronic): Secondary | ICD-10-CM | POA: Diagnosis not present

## 2015-12-17 DIAGNOSIS — C7951 Secondary malignant neoplasm of bone: Secondary | ICD-10-CM | POA: Diagnosis not present

## 2015-12-17 DIAGNOSIS — F419 Anxiety disorder, unspecified: Secondary | ICD-10-CM

## 2015-12-17 DIAGNOSIS — Z5112 Encounter for antineoplastic immunotherapy: Secondary | ICD-10-CM | POA: Diagnosis not present

## 2015-12-17 DIAGNOSIS — K22711 Barrett's esophagus with high grade dysplasia: Secondary | ICD-10-CM | POA: Diagnosis not present

## 2015-12-17 DIAGNOSIS — J019 Acute sinusitis, unspecified: Secondary | ICD-10-CM | POA: Insufficient documentation

## 2015-12-17 DIAGNOSIS — F329 Major depressive disorder, single episode, unspecified: Secondary | ICD-10-CM

## 2015-12-17 DIAGNOSIS — Z79899 Other long term (current) drug therapy: Secondary | ICD-10-CM | POA: Diagnosis not present

## 2015-12-17 DIAGNOSIS — J0101 Acute recurrent maxillary sinusitis: Secondary | ICD-10-CM

## 2015-12-17 LAB — COMPREHENSIVE METABOLIC PANEL
ALT: 13 U/L (ref 0–55)
ANION GAP: 8 meq/L (ref 3–11)
AST: 25 U/L (ref 5–34)
Albumin: 3.4 g/dL — ABNORMAL LOW (ref 3.5–5.0)
Alkaline Phosphatase: 85 U/L (ref 40–150)
BILIRUBIN TOTAL: 0.32 mg/dL (ref 0.20–1.20)
BUN: 17.2 mg/dL (ref 7.0–26.0)
CHLORIDE: 112 meq/L — AB (ref 98–109)
CO2: 24 meq/L (ref 22–29)
CREATININE: 0.8 mg/dL (ref 0.6–1.1)
Calcium: 8.8 mg/dL (ref 8.4–10.4)
EGFR: 68 mL/min/{1.73_m2} — ABNORMAL LOW (ref >90)
GLUCOSE: 89 mg/dL (ref 70–140)
Potassium: 3.7 mEq/L (ref 3.5–5.1)
SODIUM: 144 meq/L (ref 136–145)
TOTAL PROTEIN: 7.1 g/dL (ref 6.4–8.3)

## 2015-12-17 LAB — CBC & DIFF AND RETIC
BASO%: 1.4 % (ref 0.0–2.0)
Basophils Absolute: 0.1 10*3/uL (ref 0.0–0.1)
EOS ABS: 0.2 10*3/uL (ref 0.0–0.5)
EOS%: 4.7 % (ref 0.0–7.0)
HCT: 38.2 % (ref 34.8–46.6)
HEMOGLOBIN: 12.5 g/dL (ref 11.6–15.9)
IMMATURE RETIC FRACT: 5.8 % (ref 1.60–10.00)
LYMPH#: 1.1 10*3/uL (ref 0.9–3.3)
LYMPH%: 22 % (ref 14.0–49.7)
MCH: 28.5 pg (ref 25.1–34.0)
MCHC: 32.7 g/dL (ref 31.5–36.0)
MCV: 87.2 fL (ref 79.5–101.0)
MONO#: 0.4 10*3/uL (ref 0.1–0.9)
MONO%: 6.8 % (ref 0.0–14.0)
NEUT%: 65.1 % (ref 38.4–76.8)
NEUTROS ABS: 3.3 10*3/uL (ref 1.5–6.5)
Platelets: 169 10*3/uL (ref 145–400)
RBC: 4.38 10*6/uL (ref 3.70–5.45)
RDW: 16.1 % — ABNORMAL HIGH (ref 11.2–14.5)
RETIC %: 1.34 % (ref 0.70–2.10)
RETIC CT ABS: 58.69 10*3/uL (ref 33.70–90.70)
WBC: 5.1 10*3/uL (ref 3.9–10.3)

## 2015-12-17 LAB — TSH: TSH: 3.239 m[IU]/L (ref 0.308–3.960)

## 2015-12-17 MED ORDER — SODIUM CHLORIDE 0.9 % IV SOLN
Freq: Once | INTRAVENOUS | Status: AC
  Administered 2015-12-17: 11:00:00 via INTRAVENOUS

## 2015-12-17 MED ORDER — OXYCODONE-ACETAMINOPHEN 5-325 MG PO TABS: 1.0000 | ORAL_TABLET | ORAL | Status: DC | PRN

## 2015-12-17 MED ORDER — DIPHENHYDRAMINE HCL 50 MG/ML IJ SOLN
INTRAMUSCULAR | Status: AC
  Filled 2015-12-17: qty 1

## 2015-12-17 MED ORDER — AZITHROMYCIN 250 MG PO TABS: ORAL_TABLET | ORAL | Status: DC

## 2015-12-17 MED ORDER — HEPARIN SOD (PORK) LOCK FLUSH 100 UNIT/ML IV SOLN
500.0000 [IU] | Freq: Once | INTRAVENOUS | Status: AC | PRN
  Administered 2015-12-17: 500 [IU]
  Filled 2015-12-17: qty 5

## 2015-12-17 MED ORDER — FAMOTIDINE IN NACL 20-0.9 MG/50ML-% IV SOLN
INTRAVENOUS | Status: AC
  Filled 2015-12-17: qty 50

## 2015-12-17 MED ORDER — SODIUM CHLORIDE 0.9 % IJ SOLN
10.0000 mL | INTRAMUSCULAR | Status: DC | PRN
  Administered 2015-12-17: 10 mL
  Filled 2015-12-17: qty 10

## 2015-12-17 MED ORDER — FAMOTIDINE IN NACL 20-0.9 MG/50ML-% IV SOLN
20.0000 mg | Freq: Once | INTRAVENOUS | Status: AC
  Administered 2015-12-17: 20 mg via INTRAVENOUS

## 2015-12-17 MED ORDER — DIPHENHYDRAMINE HCL 50 MG/ML IJ SOLN
25.0000 mg | Freq: Once | INTRAMUSCULAR | Status: AC
  Administered 2015-12-17: 25 mg via INTRAVENOUS

## 2015-12-17 MED ORDER — SODIUM CHLORIDE 0.9 % IV SOLN
240.0000 mg | Freq: Once | INTRAVENOUS | Status: AC
  Administered 2015-12-17: 240 mg via INTRAVENOUS
  Filled 2015-12-17: qty 20

## 2015-12-17 NOTE — Telephone Encounter (Signed)
Scheduled patient appt per pof, avs report printed.  ° °Staff message sent to Michelle  °

## 2015-12-17 NOTE — Telephone Encounter (Signed)
Per staff message and POF I have scheduled appts. Advised scheduler of appts. JMW  

## 2015-12-17 NOTE — Patient Instructions (Signed)
Nivolumab injection What is this medicine? NIVOLUMAB (nye VOL ue mab) is a monoclonal antibody. It is used to treat melanoma, lung cancer, kidney cancer, and Hodgkin lymphoma. This medicine may be used for other purposes; ask your health care provider or pharmacist if you have questions. What should I tell my health care provider before I take this medicine? They need to know if you have any of these conditions: -diabetes -immune system problems -kidney disease -liver disease -lung disease -organ transplant -stomach or intestine problems -thyroid disease -an unusual or allergic reaction to nivolumab, other medicines, foods, dyes, or preservatives -pregnant or trying to get pregnant -breast-feeding How should I use this medicine? This medicine is for infusion into a vein. It is given by a health care professional in a hospital or clinic setting. A special MedGuide will be given to you before each treatment. Be sure to read this information carefully each time. Talk to your pediatrician regarding the use of this medicine in children. Special care may be needed. Overdosage: If you think you have taken too much of this medicine contact a poison control center or emergency room at once. NOTE: This medicine is only for you. Do not share this medicine with others. What if I miss a dose? It is important not to miss your dose. Call your doctor or health care professional if you are unable to keep an appointment. What may interact with this medicine? Interactions have not been studied. Give your health care provider a list of all the medicines, herbs, non-prescription drugs, or dietary supplements you use. Also tell them if you smoke, drink alcohol, or use illegal drugs. Some items may interact with your medicine. This list may not describe all possible interactions. Give your health care provider a list of all the medicines, herbs, non-prescription drugs, or dietary supplements you use. Also tell  them if you smoke, drink alcohol, or use illegal drugs. Some items may interact with your medicine. What should I watch for while using this medicine? This drug may make you feel generally unwell. Continue your course of treatment even though you feel ill unless your doctor tells you to stop. You may need blood work done while you are taking this medicine. Do not become pregnant while taking this medicine or for 5 months after stopping it. Women should inform their doctor if they wish to become pregnant or think they might be pregnant. There is a potential for serious side effects to an unborn child. Talk to your health care professional or pharmacist for more information. Do not breast-feed an infant while taking this medicine. What side effects may I notice from receiving this medicine? Side effects that you should report to your doctor or health care professional as soon as possible: -allergic reactions like skin rash, itching or hives, swelling of the face, lips, or tongue -black, tarry stools -blood in the urine -bloody or watery diarrhea -changes in vision -change in sex drive -changes in emotions or moods -chest pain -confusion -cough -decreased appetite -diarrhea -facial flushing -feeling faint or lightheaded -fever, chills -hair loss -hallucination, loss of contact with reality -headache -irritable -joint pain -loss of memory -muscle pain -muscle weakness -seizures -shortness of breath -signs and symptoms of high blood sugar such as dizziness; dry mouth; dry skin; fruity breath; nausea; stomach pain; increased hunger or thirst; increased urination -signs and symptoms of kidney injury like trouble passing urine or change in the amount of urine -signs and symptoms of liver injury like dark yellow or  brown urine; general ill feeling or flu-like symptoms; light-colored stools; loss of appetite; nausea; right upper belly pain; unusually weak or tired; yellowing of the eyes or  skin -stiff neck -swelling of the ankles, feet, hands -weight gain Side effects that usually do not require medical attention (report to your doctor or health care professional if they continue or are bothersome): -bone pain -constipation -tiredness -vomiting This list may not describe all possible side effects. Call your doctor for medical advice about side effects. You may report side effects to FDA at 1-800-FDA-1088. Where should I keep my medicine? This drug is given in a hospital or clinic and will not be stored at home. NOTE: This sheet is a summary. It may not cover all possible information. If you have questions about this medicine, talk to your doctor, pharmacist, or health care provider.    2016, Elsevier/Gold Standard. (2015-04-22 10:03:42)

## 2015-12-21 NOTE — Progress Notes (Signed)
[name]    HEMATOLOGY ONCOLOGY PROGRESS NOTE  Date of service:  .12/17/2015  Patient Care Team: Tamsen Roers, MD as PCP - General (Family Medicine) [name], MD as Consulting Physician (Hematology and Oncology)  CHIEF COMPLAINTS/PURPOSE OF CONSULTATION: Follow-up for metastatic lung cancer  DIAGNOSIS:   #1 Metastatic non-small cell lung cancer with bilateral lung nodules and large metastatic lesion in the left Ilium. #2 Barrett's esophagus with some evidence of intramucosal adenocarcinoma of the esophagus.  Treatment  1 Palliative radiation therapy to the large left ilium metastases 2. IV Nivolumab x 9ycles 3. Xgeva 171m [place] q4weeks for bone metastases.   HISTORY OF PRESENTING ILLNESS: (plz see my previous consultation for details of initial presentation)  INTERVAL HISTORY  Cindy TMowbrayis here for her scheduled follow-up prior to the tenth cycle of Nivolumab. She notes that she has been eating well and has put on about 5-7 pounds since her last visit about a month ago.had a good holiday season. Breathing is stable. She has had more energy and has been much more physically active. No other acute new concerns. No rashes. No diarrhea. Labs are stable.   MEDICAL HISTORY:  Past Medical History  Diagnosis Date  . IBS (irritable bowel syndrome)   . Bone neoplasm 06/24/2015  . GERD (gastroesophageal reflux disease)   . Vitamin D deficiency disease   . Cigarette smoker two packs a day or less     Currently still smoking 2 PPD - Not interested in quitting at this time.  . Colon polyps     hyperplastic, tubular adenomas, tubulovillous adenoma  . Endometriosis     Hysterectomy with BSO at age [age]yrs  . H/O: pneumonia   . Heavy smoker (more than 20 cigarettes per day) 06/24/2015  . Depression 06/24/2015  . Hypertension 06/24/2015    likely improved incidental to 40 lbs weight loss from her neoplasm. No Longer taking med for this as of 08-06-15  . Cancer ([facility]     metastatic poorly  differentiated carcinoma. tumor left groin surgical removal with radiation tx.  . Cough, persistent     hx. lung cancer ? primary-being evaluated, unsure of primary site.  . Pain     left hip-persistent"tumor of bone"-radiation tx. 10.  . Swelling of ankle     bilateral  . Pulmonary nodules   . Barrett's esophagus   . Hiatal hernia   . Gastritis   . Esophageal adenocarcinoma [place] 08/11/15    intramucosal  . Diverticulosis    SURGICAL HISTORY: Past Surgical History  Procedure Laterality Date  . Abdominal hysterectomy    . Tonsillectomy    . Ganglion cyst excision    . Total abdominal hysterectomy w/ bilateral salpingoophorectomy  at age [age]yrs    For endometriosis  . Knee arthroscopy  age about 89yrs  . Bartholin gland cyst excision  75yo ago    Does not want if it was an infected cyst or tumor. Was soon as delivery  . Colonoscopy w/ polypectomy      multiple times - last done 09/2014 per patient.  . Esophagogastroduodenoscopy (egd) with propofol N/A 08/11/2015    Procedure: ESOPHAGOGASTRODUODENOSCOPY (EGD) WITH PROPOFOL;  Surgeon: JJerene Bears MD;  Location: [facility];  Service: Gastroenterology;  Laterality: N/A;    SOCIAL HISTORY: Social History   Social History  . Marital Status: Widowed    Spouse Name: N/A  . Number of Children: 2  . Years of Education: N/A   Occupational  History  . Not on file.   Social History Main Topics  . Smoking status: Current Every Day Smoker -- 1.00 packs/day for 60 years    Types: Cigarettes  . Smokeless tobacco: Never Used  . Alcohol Use: Yes     Comment: occassional   . Drug Use: No  . Sexual Activity: No   Other Topics Concern  . Not on file   Social History Narrative    FAMILY HISTORY: Family History  Problem Relation Age of Onset  . Stroke Mother   . Colon cancer Father   . Colon cancer Brother   . Colon cancer Brother   . Breast cancer Daughter 27    ER/PR+ stage II    ALLERGIES:  is allergic to penicillins  and latex. patient wonders if she has a penicillin allergy but notes that she is uncertain about this.  MEDICATIONS:  Current Outpatient Prescriptions  Medication Sig Dispense Refill  . amLODipine (NORVASC) 5 MG tablet     . artificial tears (LACRILUBE) OINT ophthalmic ointment Place into both eyes at bedtime. 1 Tube 1  . azithromycin (ZITHROMAX) 250 MG tablet 559m orally on day1 then 2558mpo daily x 4days 6 each 0  . citalopram (CELEXA) 20 MG tablet TAKE 1 TABLET (20 MG TOTAL) BY MOUTH DAILY. 30 tablet 1  . docusate sodium (COLACE) 100 MG capsule Take 100 mg by mouth daily as needed for mild constipation.    . Hypromellose (ARTIFICIAL TEARS) 0.4 % SOLN Apply 1 drop to eye QID. 1 Bottle 1  . lidocaine-prilocaine (EMLA) cream Apply small amount over port 1-2 hours prior to treatment, cover with plastic wrap (DO NOT RUB IN). 30 g prn  . omeprazole (PRILOSEC) 40 MG capsule Take 1 capsule (40 mg total) by mouth daily. 30 capsule 2  . oxyCODONE-acetaminophen (PERCOCET/ROXICET) 5-325 MG tablet Take 1 tablet by mouth every 4 (four) hours as needed for severe pain. 60 tablet 0  . PRESCRIPTION MEDICATION Antibody Plan [facility]     No current facility-administered medications for this visit.    REVIEW OF SYSTEMS:    10 point review of systems done and was noted to be negative except as noted above.  PHYSICAL EXAMINATION: ECOG PERFORMANCE STATUS: 2 - Symptomatic, <50% confined to bed  Filed Vitals:   12/17/15 0929  BP: 164/64  Pulse: 58  Temp: 97.5 F (36.4 C)  Resp: 17   Filed Weights   12/17/15 0929  Weight: 130 lb 3.2 oz (59.058 kg)  . Wt Readings from Last 3 Encounters:  12/17/15 130 lb 3.2 oz (59.058 kg)  11/19/15 123 lb 1.6 oz (55.838 kg)  10/27/15 128 lb 8 oz (58.287 kg)   GENERAL:alert, pain well controlled. SKIN: skin color, texture, turgor are normal, no rashes or significant lesions EYES: normal, conjunctiva are pink and non-injected, sclera clear OROPHARYNX:no exudate, no  erythema and lips, buccal mucosa, and tongue normal  NECK: supple, thyroid normal size, non-tender, without nodularity LYMPH:  no palpable lymphadenopathy in the cervical, axillary or inguinal LUNGS: clear to auscultation and bilateral reduced breath sounds with normal respiratory effort  HEART: regular rate & rhythm and no murmurs and resolved lower extremity edema ABDOMEN:abdomen soft, non-tender and normal bowel sounds Musculoskeletal: No pedal edema. No calf pain or tenderness.  PSYCH: alert & oriented x 3 with fluent speech NEURO: no focal motor/sensory deficits.  LABORATORY DATA:  I have reviewed the data as listed . CBC Latest Ref Rng 12/17/2015 12/03/2015 11/19/2015  WBC 3.9 -  10.3 10e3/uL 5.1 5.2 6.0  Hemoglobin 11.6 - 15.9 g/dL 12.5 12.2 12.9  Hematocrit 34.8 - 46.6 % 38.2 37.8 39.8  Platelets 145 - 400 10e3/uL 169 149 181   . CMP Latest Ref Rng 12/17/2015 12/03/2015 11/19/2015  Glucose 70 - 140 mg/dl 89 85 124  BUN 7.0 - 26.0 mg/dL 17.2 12.5 13.8  Creatinine 0.6 - 1.1 mg/dL 0.8 0.8 0.8  Sodium 136 - 145 mEq/L 144 140 139  Potassium 3.5 - 5.1 mEq/L 3.7 3.9 3.3(L)  Chloride 101 - 111 mmol/L - - -  CO2 22 - 29 mEq/L 24 26 24   Calcium 8.4 - 10.4 mg/dL 8.8 8.7 9.0  Total Protein 6.4 - 8.3 g/dL 7.1 6.9 7.3  Total Bilirubin 0.20 - 1.20 mg/dL 0.32 0.44 0.33  Alkaline Phos 40 - 150 U/L 85 68 82  AST 5 - 34 U/L 25 22 16   ALT 0 - 55 U/L 13 11 10      RADIOGRAPHIC STUDIES: I have personally reviewed the radiological images as listed and agreed with the findings in the report. No results found.  ASSESSMENT & PLAN:   #1 Metastatic poorly differentiated carcinoma with likely lung primary [non-small cell lung cancer].  CT of the head with and without contrast showed no evidence of metastatic disease. Patient notes much improved pain control. EGFR blood test mutation analysis negative. Patient's pain is much better controlled after radiation for the painful ilium met.  CT  chest abdomen pelvis on 08/20/2015 showed improvement in the mediastinal adenopathy, stable lung nodules and some increase in the size of her left ilial lesion.  The increase in the left ilial lesion does not appear to be a true progression since the patient's clinical symptoms have much improved after XRT it might suggest a post CT pre XRT increase in size or pseudo-progression due to tumor infiltrating immune cells.  Patient is status post XRT to her left ilial lesion for palliative control of her symptoms. She has received 9 cycles of Nivolumab. He has tolerated treatment without any prohibitive toxicities. TSH WNL today.  CT chest abdomen pelvis and bone scan 10/21/2015. shows good response to treatment with no evidence of disease progression.  Plan -Labs are stable today and the patient is appropriate to proceed with her 10th cycle of IV Nivolumab -plan to continue Nivolumab until disease progression or prohibitive toxicities arise. -continue Cindy Byrd  - we will see patient in a month with repeat staging CT scan. -patient's pain is well-controlled and she has transitioned off OxyContin and is now on only as needed Percocet for intermittent pain from her metastatic left ilium disease. -continue labs q2weeks  #2 Anxiety and depression related to her life-threatening diagnosis of cancer . Patient did not tolerate Remeron due to nightmares  Patient overall is in a much better place now with decrease anxiety and improved sleep. Has been eating well with improvement in her weight. . Wt Readings from Last 3 Encounters:  12/17/15 130 lb 3.2 oz (59.058 kg)  11/19/15 123 lb 1.6 oz (55.838 kg)  10/27/15 128 lb 8 oz (58.287 kg)   plan -Continue on Celexa 20 mg by mouth daily    #3 Barrett's esophagus 4cms in the distal esophagus with low and high-grade dysplasia cannot rule out an early intra-mucosal esophageal adenocarcinoma. Plan -Patient being monitored by Dr. [name] from  GI. -setup for f/u for further evaluation and management in feb 2017  Return to care with Dr. Irene Limbo in 4 weeks with CBC, CMP and  repeat restaging CT scans  All questions were answered. The patient knows to call the clinic with any problems, questions or concerns.  I spent 15 minutes counseling the patient face to face, discussing her new treatments and potential adverse effects and answering all the patients questions. The total time spent in the appointment was 20 minutes and more than 50% was on counseling.  Sullivan Lone MD [place] [facility]On[facility] Physician [facility]  (Office): [phone] (Work cell): [phone] (Fax): [phone]

## 2015-12-31 ENCOUNTER — Other Ambulatory Visit (HOSPITAL_BASED_OUTPATIENT_CLINIC_OR_DEPARTMENT_OTHER): Payer: Medicare Other

## 2015-12-31 ENCOUNTER — Ambulatory Visit: Payer: Medicare Other

## 2015-12-31 ENCOUNTER — Other Ambulatory Visit: Payer: Self-pay | Admitting: *Deleted

## 2015-12-31 ENCOUNTER — Ambulatory Visit (HOSPITAL_BASED_OUTPATIENT_CLINIC_OR_DEPARTMENT_OTHER): Payer: Medicare Other

## 2015-12-31 VITALS — BP 165/67 | HR 69 | Temp 98.0°F | Resp 19

## 2015-12-31 DIAGNOSIS — C3490 Malignant neoplasm of unspecified part of unspecified bronchus or lung: Secondary | ICD-10-CM | POA: Diagnosis not present

## 2015-12-31 DIAGNOSIS — C7951 Secondary malignant neoplasm of bone: Secondary | ICD-10-CM | POA: Diagnosis not present

## 2015-12-31 DIAGNOSIS — Z5112 Encounter for antineoplastic immunotherapy: Secondary | ICD-10-CM | POA: Diagnosis not present

## 2015-12-31 DIAGNOSIS — Z95828 Presence of other vascular implants and grafts: Secondary | ICD-10-CM

## 2015-12-31 LAB — COMPREHENSIVE METABOLIC PANEL
ALBUMIN: 3.3 g/dL — AB (ref 3.5–5.0)
ALK PHOS: 77 U/L (ref 40–150)
ALT: 10 U/L (ref 0–55)
AST: 19 U/L (ref 5–34)
Anion Gap: 6 mEq/L (ref 3–11)
BUN: 15.7 mg/dL (ref 7.0–26.0)
CALCIUM: 8.8 mg/dL (ref 8.4–10.4)
CO2: 24 mEq/L (ref 22–29)
Chloride: 111 mEq/L — ABNORMAL HIGH (ref 98–109)
Creatinine: 0.8 mg/dL (ref 0.6–1.1)
EGFR: 76 mL/min/{1.73_m2} — AB (ref >90)
Glucose: 87 mg/dl (ref 70–140)
POTASSIUM: 4.1 meq/L (ref 3.5–5.1)
Sodium: 141 mEq/L (ref 136–145)
Total Bilirubin: 0.42 mg/dL (ref 0.20–1.20)
Total Protein: 7 g/dL (ref 6.4–8.3)

## 2015-12-31 LAB — CBC & DIFF AND RETIC
BASO%: 0.9 % (ref 0.0–2.0)
BASOS ABS: 0 10*3/uL (ref 0.0–0.1)
EOS ABS: 0.2 10*3/uL (ref 0.0–0.5)
EOS%: 4.3 % (ref 0.0–7.0)
HEMATOCRIT: 36.5 % (ref 34.8–46.6)
HEMOGLOBIN: 12 g/dL (ref 11.6–15.9)
Immature Retic Fract: 0.7 % — ABNORMAL LOW (ref 1.60–10.00)
LYMPH%: 18 % (ref 14.0–49.7)
MCH: 29 pg (ref 25.1–34.0)
MCHC: 32.9 g/dL (ref 31.5–36.0)
MCV: 88.2 fL (ref 79.5–101.0)
MONO#: 0.4 10*3/uL (ref 0.1–0.9)
MONO%: 7.5 % (ref 0.0–14.0)
NEUT%: 69.3 % (ref 38.4–76.8)
NEUTROS ABS: 3.2 10*3/uL (ref 1.5–6.5)
Platelets: 157 10*3/uL (ref 145–400)
RBC: 4.14 10*6/uL (ref 3.70–5.45)
RDW: 15.9 % — ABNORMAL HIGH (ref 11.2–14.5)
Retic %: 1.13 % (ref 0.70–2.10)
Retic Ct Abs: 46.78 10*3/uL (ref 33.70–90.70)
WBC: 4.7 10*3/uL (ref 3.9–10.3)
lymph#: 0.8 10*3/uL — ABNORMAL LOW (ref 0.9–3.3)

## 2015-12-31 MED ORDER — AMLODIPINE BESYLATE 5 MG PO TABS: 5.0000 mg | ORAL_TABLET | Freq: Every day | ORAL | Status: DC

## 2015-12-31 MED ORDER — DENOSUMAB 120 MG/1.7ML ~~LOC~~ SOLN
120.0000 mg | Freq: Once | SUBCUTANEOUS | Status: AC
  Administered 2015-12-31: 120 mg via SUBCUTANEOUS
  Filled 2015-12-31: qty 1.7

## 2015-12-31 MED ORDER — SODIUM CHLORIDE 0.9 % IV SOLN
Freq: Once | INTRAVENOUS | Status: AC
  Administered 2015-12-31: 15:00:00 via INTRAVENOUS

## 2015-12-31 MED ORDER — SODIUM CHLORIDE 0.9% FLUSH
10.0000 mL | INTRAVENOUS | Status: DC | PRN
  Administered 2015-12-31: 10 mL via INTRAVENOUS
  Filled 2015-12-31: qty 10

## 2015-12-31 MED ORDER — SODIUM CHLORIDE 0.9 % IJ SOLN
10.0000 mL | INTRAMUSCULAR | Status: DC | PRN
  Administered 2015-12-31: 10 mL
  Filled 2015-12-31: qty 10

## 2015-12-31 MED ORDER — FAMOTIDINE IN NACL 20-0.9 MG/50ML-% IV SOLN
20.0000 mg | Freq: Once | INTRAVENOUS | Status: AC
  Administered 2015-12-31: 20 mg via INTRAVENOUS

## 2015-12-31 MED ORDER — DIPHENHYDRAMINE HCL 50 MG/ML IJ SOLN
INTRAMUSCULAR | Status: AC
  Filled 2015-12-31: qty 1

## 2015-12-31 MED ORDER — SODIUM CHLORIDE 0.9 % IV SOLN
240.0000 mg | Freq: Once | INTRAVENOUS | Status: AC
  Administered 2015-12-31: 240 mg via INTRAVENOUS
  Filled 2015-12-31: qty 20

## 2015-12-31 MED ORDER — FAMOTIDINE IN NACL 20-0.9 MG/50ML-% IV SOLN
INTRAVENOUS | Status: AC
  Filled 2015-12-31: qty 50

## 2015-12-31 MED ORDER — DIPHENHYDRAMINE HCL 50 MG/ML IJ SOLN
25.0000 mg | Freq: Once | INTRAMUSCULAR | Status: AC
  Administered 2015-12-31: 25 mg via INTRAVENOUS

## 2015-12-31 MED ORDER — HEPARIN SOD (PORK) LOCK FLUSH 100 UNIT/ML IV SOLN
500.0000 [IU] | Freq: Once | INTRAVENOUS | Status: AC | PRN
  Administered 2015-12-31: 500 [IU]
  Filled 2015-12-31: qty 5

## 2015-12-31 NOTE — Patient Instructions (Signed)

## 2015-12-31 NOTE — Progress Notes (Signed)
Patient is aware of BP today and 2 weeks ago she knew it was high and she stated her MD may put her back on her HTN meds.  She stated she has intermittent headaches not responding to OTC's at home. Will notify Dr. Irene Limbo today. Darden Dates, nurse to Dr. Irene Limbo, pt will resume taking Norvasc '5mg'$  as directed.  Pt stated she does not feel like she has anymore at home so a new one will be sent to her pharmacy today.

## 2015-12-31 NOTE — Patient Instructions (Signed)
[facility] Discharge Instructions for Patients Receiving Chemotherapy  Today you received the following chemotherapy agents Opdivo.  To help prevent nausea and vomiting after your treatment, we encourage you to take your nausea medication as directed.   If you develop nausea and vomiting that is not controlled by your nausea medication, call the clinic.   BELOW ARE SYMPTOMS THAT SHOULD BE REPORTED IMMEDIATELY:  *FEVER GREATER THAN 100.5 F  *CHILLS WITH OR WITHOUT FEVER  NAUSEA AND VOMITING THAT IS NOT CONTROLLED WITH YOUR NAUSEA MEDICATION  *UNUSUAL SHORTNESS OF BREATH  *UNUSUAL BRUISING OR BLEEDING  TENDERNESS IN MOUTH AND THROAT WITH OR WITHOUT PRESENCE OF ULCERS  *URINARY PROBLEMS  *BOWEL PROBLEMS  UNUSUAL RASH Items with * indicate a potential emergency and should be followed up as soon as possible.  Feel free to call the clinic you have any questions or concerns. The clinic phone number is [phone].  Please show the CHEMO ALERT CARD at check-in to the Emergency Department and triage nurse.    

## 2016-01-14 ENCOUNTER — Other Ambulatory Visit: Payer: Medicare Other

## 2016-01-14 ENCOUNTER — Encounter (HOSPITAL_COMMUNITY): Payer: Self-pay

## 2016-01-14 ENCOUNTER — Ambulatory Visit (HOSPITAL_BASED_OUTPATIENT_CLINIC_OR_DEPARTMENT_OTHER): Payer: Medicare Other

## 2016-01-14 ENCOUNTER — Other Ambulatory Visit (HOSPITAL_BASED_OUTPATIENT_CLINIC_OR_DEPARTMENT_OTHER): Payer: Medicare Other

## 2016-01-14 ENCOUNTER — Ambulatory Visit (HOSPITAL_COMMUNITY)
Admission: RE | Admit: 2016-01-14 | Discharge: 2016-01-14 | Disposition: A | Discharge status: Home / Self Care | Payer: Medicare Other | Source: Ambulatory Visit | Attending: Hematology | Admitting: Hematology

## 2016-01-14 ENCOUNTER — Ambulatory Visit: Payer: Medicare Other

## 2016-01-14 VITALS — BP 126/53 | HR 61 | Temp 98.2°F | Resp 18

## 2016-01-14 VITALS — BP 153/64 | HR 67 | Temp 97.5°F | Resp 18

## 2016-01-14 DIAGNOSIS — C7951 Secondary malignant neoplasm of bone: Secondary | ICD-10-CM | POA: Diagnosis not present

## 2016-01-14 DIAGNOSIS — C3490 Malignant neoplasm of unspecified part of unspecified bronchus or lung: Secondary | ICD-10-CM

## 2016-01-14 DIAGNOSIS — I7090 Unspecified atherosclerosis: Secondary | ICD-10-CM | POA: Insufficient documentation

## 2016-01-14 DIAGNOSIS — Z5112 Encounter for antineoplastic immunotherapy: Secondary | ICD-10-CM | POA: Diagnosis not present

## 2016-01-14 DIAGNOSIS — Z95828 Presence of other vascular implants and grafts: Secondary | ICD-10-CM

## 2016-01-14 LAB — COMPREHENSIVE METABOLIC PANEL
ALK PHOS: 92 U/L (ref 40–150)
ALT: 12 U/L (ref 0–55)
ANION GAP: 10 meq/L (ref 3–11)
AST: 19 U/L (ref 5–34)
Albumin: 3.6 g/dL (ref 3.5–5.0)
BILIRUBIN TOTAL: 0.35 mg/dL (ref 0.20–1.20)
BUN: 13.1 mg/dL (ref 7.0–26.0)
CALCIUM: 9.2 mg/dL (ref 8.4–10.4)
CHLORIDE: 108 meq/L (ref 98–109)
CO2: 23 mEq/L (ref 22–29)
CREATININE: 0.7 mg/dL (ref 0.6–1.1)
EGFR: 79 mL/min/{1.73_m2} — ABNORMAL LOW (ref >90)
Glucose: 94 mg/dl (ref 70–140)
Potassium: 3.8 mEq/L (ref 3.5–5.1)
Sodium: 141 mEq/L (ref 136–145)
TOTAL PROTEIN: 7.4 g/dL (ref 6.4–8.3)

## 2016-01-14 LAB — CBC & DIFF AND RETIC
BASO%: 1.3 % (ref 0.0–2.0)
BASOS ABS: 0.1 10*3/uL (ref 0.0–0.1)
EOS%: 4.2 % (ref 0.0–7.0)
Eosinophils Absolute: 0.3 10*3/uL (ref 0.0–0.5)
HEMATOCRIT: 38.3 % (ref 34.8–46.6)
HGB: 12.6 g/dL (ref 11.6–15.9)
Immature Retic Fract: 2.1 % (ref 1.60–10.00)
LYMPH#: 1 10*3/uL (ref 0.9–3.3)
LYMPH%: 17.2 % (ref 14.0–49.7)
MCH: 29.1 pg (ref 25.1–34.0)
MCHC: 32.9 g/dL (ref 31.5–36.0)
MCV: 88.5 fL (ref 79.5–101.0)
MONO#: 0.5 10*3/uL (ref 0.1–0.9)
MONO%: 7.9 % (ref 0.0–14.0)
NEUT#: 4.1 10*3/uL (ref 1.5–6.5)
NEUT%: 69.4 % (ref 38.4–76.8)
PLATELETS: 165 10*3/uL (ref 145–400)
RBC: 4.33 10*6/uL (ref 3.70–5.45)
RDW: 15.2 % — ABNORMAL HIGH (ref 11.2–14.5)
RETIC CT ABS: 42.43 10*3/uL (ref 33.70–90.70)
Retic %: 0.98 % (ref 0.70–2.10)
WBC: 5.9 10*3/uL (ref 3.9–10.3)

## 2016-01-14 MED ORDER — SODIUM CHLORIDE 0.9 % IV SOLN
Freq: Once | INTRAVENOUS | Status: AC
  Administered 2016-01-14: 11:00:00 via INTRAVENOUS

## 2016-01-14 MED ORDER — FAMOTIDINE IN NACL 20-0.9 MG/50ML-% IV SOLN
20.0000 mg | Freq: Once | INTRAVENOUS | Status: AC
  Administered 2016-01-14: 20 mg via INTRAVENOUS

## 2016-01-14 MED ORDER — HEPARIN SOD (PORK) LOCK FLUSH 100 UNIT/ML IV SOLN
500.0000 [IU] | Freq: Once | INTRAVENOUS | Status: AC | PRN
  Administered 2016-01-14: 500 [IU]
  Filled 2016-01-14: qty 5

## 2016-01-14 MED ORDER — SODIUM CHLORIDE 0.9 % IV SOLN
240.0000 mg | Freq: Once | INTRAVENOUS | Status: AC
  Administered 2016-01-14: 240 mg via INTRAVENOUS
  Filled 2016-01-14: qty 8

## 2016-01-14 MED ORDER — DIPHENHYDRAMINE HCL 50 MG/ML IJ SOLN
25.0000 mg | Freq: Once | INTRAMUSCULAR | Status: AC
  Administered 2016-01-14: 25 mg via INTRAVENOUS

## 2016-01-14 MED ORDER — DIPHENHYDRAMINE HCL 50 MG/ML IJ SOLN
INTRAMUSCULAR | Status: AC
  Filled 2016-01-14: qty 1

## 2016-01-14 MED ORDER — IOHEXOL 300 MG/ML  SOLN
100.0000 mL | Freq: Once | INTRAMUSCULAR | Status: AC | PRN
  Administered 2016-01-14: 100 mL via INTRAVENOUS

## 2016-01-14 MED ORDER — FAMOTIDINE IN NACL 20-0.9 MG/50ML-% IV SOLN
INTRAVENOUS | Status: AC
  Filled 2016-01-14: qty 50

## 2016-01-14 MED ORDER — SODIUM CHLORIDE 0.9% FLUSH
10.0000 mL | INTRAVENOUS | Status: DC | PRN
  Administered 2016-01-14: 10 mL via INTRAVENOUS
  Filled 2016-01-14: qty 10

## 2016-01-14 MED ORDER — SODIUM CHLORIDE 0.9 % IJ SOLN
10.0000 mL | INTRAMUSCULAR | Status: DC | PRN
  Administered 2016-01-14: 10 mL
  Filled 2016-01-14: qty 10

## 2016-01-14 NOTE — Patient Instructions (Signed)

## 2016-01-14 NOTE — Patient Instructions (Signed)
[facility] Discharge Instructions for Patients Receiving Chemotherapy  Today you received the following chemotherapy agents:  Nivolumab.  To help prevent nausea and vomiting after your treatment, we encourage you to take your nausea medication as directed.   If you develop nausea and vomiting that is not controlled by your nausea medication, call the clinic.   BELOW ARE SYMPTOMS THAT SHOULD BE REPORTED IMMEDIATELY:  *FEVER GREATER THAN 100.5 F  *CHILLS WITH OR WITHOUT FEVER  NAUSEA AND VOMITING THAT IS NOT CONTROLLED WITH YOUR NAUSEA MEDICATION  *UNUSUAL SHORTNESS OF BREATH  *UNUSUAL BRUISING OR BLEEDING  TENDERNESS IN MOUTH AND THROAT WITH OR WITHOUT PRESENCE OF ULCERS  *URINARY PROBLEMS  *BOWEL PROBLEMS  UNUSUAL RASH Items with * indicate a potential emergency and should be followed up as soon as possible.  Feel free to call the clinic you have any questions or concerns. The clinic phone number is [phone].  Please show the CHEMO ALERT CARD at check-in to the Emergency Department and triage nurse.   

## 2016-01-19 ENCOUNTER — Ambulatory Visit (HOSPITAL_BASED_OUTPATIENT_CLINIC_OR_DEPARTMENT_OTHER): Payer: Medicare Other | Admitting: Hematology

## 2016-01-19 ENCOUNTER — Encounter: Payer: Self-pay | Admitting: Hematology

## 2016-01-19 VITALS — BP 158/63 | HR 73 | Temp 97.8°F | Resp 18 | Ht 67.5 in | Wt 127.8 lb

## 2016-01-19 DIAGNOSIS — Z720 Tobacco use: Secondary | ICD-10-CM | POA: Diagnosis not present

## 2016-01-19 DIAGNOSIS — C3491 Malignant neoplasm of unspecified part of right bronchus or lung: Secondary | ICD-10-CM

## 2016-01-19 DIAGNOSIS — F17200 Nicotine dependence, unspecified, uncomplicated: Secondary | ICD-10-CM | POA: Insufficient documentation

## 2016-01-19 DIAGNOSIS — F17290 Nicotine dependence, other tobacco product, uncomplicated: Secondary | ICD-10-CM

## 2016-01-19 MED ORDER — CALCIUM POLYCARBOPHIL 625 MG PO TABS: 1250.0000 mg | ORAL_TABLET | Freq: Two times a day (BID) | ORAL | Status: DC

## 2016-01-19 MED ORDER — NICOTINE 21 MG/24HR TD PT24: 21.0000 mg | MEDICATED_PATCH | Freq: Every day | TRANSDERMAL | Status: DC

## 2016-01-19 MED ORDER — CITALOPRAM HYDROBROMIDE 20 MG PO TABS: 20.0000 mg | ORAL_TABLET | Freq: Every day | ORAL | Status: DC

## 2016-01-20 ENCOUNTER — Telehealth: Payer: Self-pay | Admitting: Hematology

## 2016-01-20 ENCOUNTER — Telehealth: Payer: Self-pay | Admitting: *Deleted

## 2016-01-20 NOTE — Telephone Encounter (Signed)
per pof to sch pt appt-seen MW email to sch trmt-pt to get updated copy b4 leaving trmt room

## 2016-01-20 NOTE — Telephone Encounter (Signed)
Per staff message and POF I have scheduled appts. Advised scheduler of appts. JMW  

## 2016-01-23 ENCOUNTER — Other Ambulatory Visit: Payer: Self-pay | Admitting: Internal Medicine

## 2016-01-24 NOTE — Progress Notes (Signed)
Cindy Byrd    HEMATOLOGY ONCOLOGY PROGRESS NOTE  Date of service:  .01/19/2016   Patient Care Team: Tamsen Roers, MD as PCP - General (Family Medicine) [name], MD as Consulting Physician (Hematology and Oncology)  CHIEF COMPLAINTS/PURPOSE OF CONSULTATION: Follow-up for metastatic lung cancer  DIAGNOSIS:   #1 Metastatic non-small cell lung cancer with bilateral lung nodules and large metastatic lesion in the left Ilium. #2 Barrett's esophagus with some evidence of intramucosal adenocarcinoma of the esophagus. (being managed and followed by Dr [name]- Gastroenterology)  Treatment  1 Palliative radiation therapy to the large left ilium metastases 2. IV Nivolumab x 13ycles 3. Xgeva 123m [place] q4weeks for bone metastases.   HISTORY OF PRESENTING ILLNESS: (plz see my previous consultation for details of initial presentation)  INTERVAL HISTORY  Ms TSpackmanis here for her scheduled follow-up prior to the 14th cycle of Nivolumab and to followup on her restaging CT's . She notes that she is feeling well and has no new symptoms. She is glad that her scans are looking stable. She notes that she is hardly needing any pain medications now and has tried to become much more active. We talked about need for smoking cessation. She is agreeable to trying to use a nicotine patch and try to cut down her cigarette smoking. She expresses gratitude for her cares here at the cancer center.  MEDICAL HISTORY:  Past Medical History  Diagnosis Date  . IBS (irritable bowel syndrome)   . Bone neoplasm 06/24/2015  . GERD (gastroesophageal reflux disease)   . Vitamin D deficiency disease   . Cigarette smoker two packs a day or less     Currently still smoking 2 PPD - Not interested in quitting at this time.  . Colon polyps     hyperplastic, tubular adenomas, tubulovillous adenoma  . Endometriosis     Hysterectomy with BSO at age [age]yrs  . H/O: pneumonia   . Heavy smoker (more than 20 cigarettes per  day) 06/24/2015  . Depression 06/24/2015  . Hypertension 06/24/2015    likely improved incidental to 40 lbs weight loss from her neoplasm. No Longer taking med for this as of 08-06-15  . Cancer ([facility]     metastatic poorly differentiated carcinoma. tumor left groin surgical removal with radiation tx.  . Cough, persistent     hx. lung cancer ? primary-being evaluated, unsure of primary site.  . Pain     left hip-persistent"tumor of bone"-radiation tx. 10.  . Swelling of ankle     bilateral  . Pulmonary nodules   . Barrett's esophagus   . Hiatal hernia   . Gastritis   . Esophageal adenocarcinoma ([facility] 08/11/15    intramucosal  . Diverticulosis    SURGICAL HISTORY: Past Surgical History  Procedure Laterality Date  . Abdominal hysterectomy    . Tonsillectomy    . Ganglion cyst excision    . Total abdominal hysterectomy w/ bilateral salpingoophorectomy  at age [age]yrs    For endometriosis  . Knee arthroscopy  age about [age]yrs  . Bartholin gland cyst excision  75yo ago    Does not want if it was an infected cyst or tumor. Was soon as delivery  . Colonoscopy w/ polypectomy      multiple times - last done 09/2014 per patient.  . Esophagogastroduodenoscopy (egd) with propofol N/A 08/11/2015    Procedure: ESOPHAGOGASTRODUODENOSCOPY (EGD) WITH PROPOFOL;  Surgeon: JJerene Bears MD;  Location: [facility];  Service: Gastroenterology;  Laterality:  N/A;    SOCIAL HISTORY: Social History   Social History  . Marital Status: Widowed    Spouse Name: N/A  . Number of Children: 2  . Years of Education: N/A   Occupational History  . Not on file.   Social History Main Topics  . Smoking status: Current Every Day Smoker -- 1.00 packs/day for 60 years    Types: Cigarettes  . Smokeless tobacco: Never Used  . Alcohol Use: Yes     Comment: occassional   . Drug Use: No  . Sexual Activity: No   Other Topics Concern  . Not on file   Social History Narrative    FAMILY HISTORY: Family History    Problem Relation Age of Onset  . Stroke Mother   . Colon cancer Father   . Colon cancer Brother   . Colon cancer Brother   . Breast cancer Daughter 39    ER/PR+ stage II    ALLERGIES:  is allergic to penicillins; remeron; and latex. patient wonders if she has a penicillin allergy but notes that she is uncertain about this.  MEDICATIONS:  Current Outpatient Prescriptions  Medication Sig Dispense Refill  . amLODipine (NORVASC) 5 MG tablet Take 1 tablet (5 mg total) by mouth daily. 30 tablet 1  . artificial tears (LACRILUBE) OINT ophthalmic ointment Place into both eyes at bedtime. 1 Tube 1  . azithromycin (ZITHROMAX) 250 MG tablet 566m orally on day1 then 2576mpo daily x 4days 6 each 0  . citalopram (CELEXA) 20 MG tablet Take 1 tablet (20 mg total) by mouth daily. 30 tablet 3  . docusate sodium (COLACE) 100 MG capsule Take 100 mg by mouth daily as needed for mild constipation.    . Hypromellose (ARTIFICIAL TEARS) 0.4 % SOLN Apply 1 drop to eye QID. 1 Bottle 1  . lidocaine-prilocaine (EMLA) cream Apply small amount over port 1-2 hours prior to treatment, cover with plastic wrap (DO NOT RUB IN). 30 g prn  . omeprazole (PRILOSEC) 40 MG capsule Take 1 capsule (40 mg total) by mouth daily. 30 capsule 2  . oxyCODONE-acetaminophen (PERCOCET/ROXICET) 5-325 MG tablet Take 1 tablet by mouth every 4 (four) hours as needed for severe pain. 60 tablet 0  . PRESCRIPTION MEDICATION Antibody Plan [facility]    . nicotine (EQ NICOTINE) 21 mg/24hr patch Place 1 patch (21 mg total) onto the skin daily. 28 patch 0  . polycarbophil (FIBERCON) 625 MG tablet Take 2 tablets (1,250 mg total) by mouth 2 (two) times daily. 120 tablet 1   No current facility-administered medications for this visit.    REVIEW OF SYSTEMS:    10 point review of systems done and was noted to be negative except as noted above.  PHYSICAL EXAMINATION: ECOG PERFORMANCE STATUS: 2 - Symptomatic, <50% confined to bed  Filed Vitals:    01/19/16 1501  BP: 158/63  Pulse: 73  Temp: 97.8 F (36.6 C)  Resp: 18   Filed Weights   01/19/16 1501  Weight: 127 lb 12.8 oz (57.97 kg)  . Wt Readings from Last 3 Encounters:  01/19/16 127 lb 12.8 oz (57.97 kg)  12/17/15 130 lb 3.2 oz (59.058 kg)  11/19/15 123 lb 1.6 oz (55.838 kg)   GENERAL:alert, NAD, appears well. SKIN: skin color, texture, turgor are normal, no rashes or significant lesions EYES: normal, conjunctiva are pink and non-injected, sclera clear OROPHARYNX:no exudate, no erythema and lips, buccal mucosa, and tongue normal  NECK: supple, thyroid normal size, non-tender, without nodularity  LYMPH:  no palpable lymphadenopathy in the cervical, axillary or inguinal LUNGS: clear to auscultation and bilateral reduced breath sounds with normal respiratory effort  HEART: regular rate & rhythm and no murmurs and resolved lower extremity edema ABDOMEN:abdomen soft, non-tender and normal bowel sounds Musculoskeletal: No pedal edema. No calf pain or tenderness.  PSYCH: alert & oriented x 3 with fluent speech NEURO: no focal motor/sensory deficits.  LABORATORY DATA:  I have reviewed the data as listed . CBC Latest Ref Rng 01/14/2016 12/31/2015 12/17/2015  WBC 3.9 - 10.3 10e3/uL 5.9 4.7 5.1  Hemoglobin 11.6 - 15.9 g/dL 12.6 12.0 12.5  Hematocrit 34.8 - 46.6 % 38.3 36.5 38.2  Platelets 145 - 400 10e3/uL 165 157 169   . CMP Latest Ref Rng 01/14/2016 12/31/2015 12/17/2015  Glucose 70 - 140 mg/dl 94 87 89  BUN 7.0 - 26.0 mg/dL 13.1 15.7 17.2  Creatinine 0.6 - 1.1 mg/dL 0.7 0.8 0.8  Sodium 136 - 145 mEq/L 141 141 144  Potassium 3.5 - 5.1 mEq/L 3.8 4.1 3.7  CO2 22 - 29 mEq/L 23 24 24   Calcium 8.4 - 10.4 mg/dL 9.2 8.8 8.8  Total Protein 6.4 - 8.3 g/dL 7.4 7.0 7.1  Total Bilirubin 0.20 - 1.20 mg/dL 0.35 0.42 0.32  Alkaline Phos 40 - 150 U/L 92 77 85  AST 5 - 34 U/L 19 19 25   ALT 0 - 55 U/L 12 10 13      RADIOGRAPHIC STUDIES: I have personally reviewed the radiological images  as listed and agreed with the findings in the report. Ct Chest W Contrast  01/14/2016  CLINICAL DATA:  75 year old female with history of metastatic lung cancer to the bones diagnosed in July 2016. Chemotherapy ongoing. EXAM: CT CHEST, ABDOMEN, AND PELVIS WITH CONTRAST TECHNIQUE: Multidetector CT imaging of the chest, abdomen and pelvis was performed following the standard protocol during bolus administration of intravenous contrast. CONTRAST:  173m OMNIPAQUE IOHEXOL 300 MG/ML  SOLN COMPARISON:  CT of the chest, abdomen and pelvis 10/21/2015. FINDINGS: CT CHEST FINDINGS Mediastinum/Lymph Nodes: Heart size is normal. There is no significant pericardial fluid, thickening or pericardial calcification. No pathologically enlarged mediastinal or hilar lymph nodes. Right internal jugular single-lumen porta cath with tip terminating at the superior cavoatrial junction. Esophagus is unremarkable in appearance. No axillary lymphadenopathy. Lungs/Pleura: Previously noted left lower lobe nodule appears slightly decreased in size compared to the prior study, measuring 12 x 4 mm (image 33 of series 5) on today's examination. The nearby left lower lobe nodule associated with the left major fissure is stable in size measuring 4 mm) ground-glass attenuation nodule on image 31 of series 5). 6 mm right lower lobe ground-glass attenuation nodule (image 36 of series 5) appears slightly less distinct than the prior examination. No other new or enlarging suspicious appearing pulmonary nodules or masses are noted on today's examination. There is some mild scarring in the lower lobes of the lungs bilaterally near the base. No acute consolidative airspace disease. No pleural effusions. Mild diffuse bronchial wall thickening with mild centrilobular emphysema. Musculoskeletal/Soft Tissues: There are no aggressive appearing lytic or blastic lesions noted in the visualized portions of the skeleton. CT ABDOMEN AND PELVIS FINDINGS Hepatobiliary:  Multiple well-defined low-attenuation hepatic lesions are unchanged in size, number and distribution. The largest of these measure up to 1.3 cm in the central aspect of segment 4B of the liver, and the largest lesions are compatible with simple cysts. The smaller lesions are too small to definitively characterize, but are  similar prior examinations, favored to represent tiny cysts or biliary hamartomas. No new suspicious hepatic lesions are noted. No intra or extrahepatic biliary ductal dilatation. Gallbladder is normal in appearance. Pancreas: No pancreatic mass. No pancreatic ductal dilatation. No pancreatic or peripancreatic fluid or inflammatory changes. Spleen: Unremarkable. Adrenals/Urinary Tract: Bilateral adrenal glands and bilateral kidneys are normal in appearance. No hydroureteronephrosis. Urinary bladder is normal in appearance. Stomach/Bowel: Normal appearance of the stomach. No pathologic dilatation of small bowel or colon. Several colonic diverticulae are noted, without surrounding inflammatory changes to suggest an acute diverticulitis at this time. Normal appendix. Vascular/Lymphatic: Atherosclerosis throughout the abdominal and pelvic vasculature, without evidence of aneurysm or dissection. No lymphadenopathy noted in the abdomen or pelvis. Reproductive: Status post hysterectomy. Ovaries are not confidently identified and may be surgically absent or atrophic. Other: No significant volume of ascites.  No pneumoperitoneum. Musculoskeletal: Large lytic lesion in the left ilium with extension into the overlying iliacus and gluteal musculature is again noted. This appears slightly less expansile than the prior examination and demonstrates increasing sclerosis, which suggests a partial response to therapy. Multiple pathologic fractures are noted through this lesion, similar to the prior examination. No new osseous lesions are otherwise noted. IMPRESSION: 1. Today's study demonstrates a positive response  to therapy. Specifically, the left lower lobe nodule is smaller than prior examinations, and the other previously noted small pulmonary nodules are slightly less apparent or stable when compared to prior examinations. Additionally, the larger osseous metastasis in the left iliac bone demonstrates less of a soft tissue component and show some interval bone formation, suggesting a positive response to therapy. 2. No new signs of metastatic disease are noted elsewhere in the chest, abdomen or pelvis. 3. Atherosclerosis. 4. Additional incidental findings, as above. Electronically Signed   By: Vinnie Langton M.D.   On: 01/14/2016 10:15   Ct Abdomen Pelvis W Contrast  01/14/2016  CLINICAL DATA:  75 year old female with history of metastatic lung cancer to the bones diagnosed in July 2016. Chemotherapy ongoing. EXAM: CT CHEST, ABDOMEN, AND PELVIS WITH CONTRAST TECHNIQUE: Multidetector CT imaging of the chest, abdomen and pelvis was performed following the standard protocol during bolus administration of intravenous contrast. CONTRAST:  159m OMNIPAQUE IOHEXOL 300 MG/ML  SOLN COMPARISON:  CT of the chest, abdomen and pelvis 10/21/2015. FINDINGS: CT CHEST FINDINGS Mediastinum/Lymph Nodes: Heart size is normal. There is no significant pericardial fluid, thickening or pericardial calcification. No pathologically enlarged mediastinal or hilar lymph nodes. Right internal jugular single-lumen porta cath with tip terminating at the superior cavoatrial junction. Esophagus is unremarkable in appearance. No axillary lymphadenopathy. Lungs/Pleura: Previously noted left lower lobe nodule appears slightly decreased in size compared to the prior study, measuring 12 x 4 mm (image 33 of series 5) on today's examination. The nearby left lower lobe nodule associated with the left major fissure is stable in size measuring 4 mm) ground-glass attenuation nodule on image 31 of series 5). 6 mm right lower lobe ground-glass attenuation nodule  (image 36 of series 5) appears slightly less distinct than the prior examination. No other new or enlarging suspicious appearing pulmonary nodules or masses are noted on today's examination. There is some mild scarring in the lower lobes of the lungs bilaterally near the base. No acute consolidative airspace disease. No pleural effusions. Mild diffuse bronchial wall thickening with mild centrilobular emphysema. Musculoskeletal/Soft Tissues: There are no aggressive appearing lytic or blastic lesions noted in the visualized portions of the skeleton. CT ABDOMEN AND PELVIS FINDINGS Hepatobiliary: Multiple  well-defined low-attenuation hepatic lesions are unchanged in size, number and distribution. The largest of these measure up to 1.3 cm in the central aspect of segment 4B of the liver, and the largest lesions are compatible with simple cysts. The smaller lesions are too small to definitively characterize, but are similar prior examinations, favored to represent tiny cysts or biliary hamartomas. No new suspicious hepatic lesions are noted. No intra or extrahepatic biliary ductal dilatation. Gallbladder is normal in appearance. Pancreas: No pancreatic mass. No pancreatic ductal dilatation. No pancreatic or peripancreatic fluid or inflammatory changes. Spleen: Unremarkable. Adrenals/Urinary Tract: Bilateral adrenal glands and bilateral kidneys are normal in appearance. No hydroureteronephrosis. Urinary bladder is normal in appearance. Stomach/Bowel: Normal appearance of the stomach. No pathologic dilatation of small bowel or colon. Several colonic diverticulae are noted, without surrounding inflammatory changes to suggest an acute diverticulitis at this time. Normal appendix. Vascular/Lymphatic: Atherosclerosis throughout the abdominal and pelvic vasculature, without evidence of aneurysm or dissection. No lymphadenopathy noted in the abdomen or pelvis. Reproductive: Status post hysterectomy. Ovaries are not confidently  identified and may be surgically absent or atrophic. Other: No significant volume of ascites.  No pneumoperitoneum. Musculoskeletal: Large lytic lesion in the left ilium with extension into the overlying iliacus and gluteal musculature is again noted. This appears slightly less expansile than the prior examination and demonstrates increasing sclerosis, which suggests a partial response to therapy. Multiple pathologic fractures are noted through this lesion, similar to the prior examination. No new osseous lesions are otherwise noted. IMPRESSION: 1. Today's study demonstrates a positive response to therapy. Specifically, the left lower lobe nodule is smaller than prior examinations, and the other previously noted small pulmonary nodules are slightly less apparent or stable when compared to prior examinations. Additionally, the larger osseous metastasis in the left iliac bone demonstrates less of a soft tissue component and show some interval bone formation, suggesting a positive response to therapy. 2. No new signs of metastatic disease are noted elsewhere in the chest, abdomen or pelvis. 3. Atherosclerosis. 4. Additional incidental findings, as above. Electronically Signed   By: Vinnie Langton M.D.   On: 01/14/2016 10:15    ASSESSMENT & PLAN:   #1 Metastatic poorly differentiated carcinoma with likely lung primary [non-small cell lung cancer].  CT of the head with and without contrast showed no evidence of metastatic disease. Patient notes much improved pain control. EGFR blood test mutation analysis negative. Patient's pain is much better controlled after radiation for the painful ilium met.  CT chest abdomen pelvis on 08/20/2015 showed improvement in the mediastinal adenopathy, stable lung nodules and some increase in the size of her left ilial lesion.  The increase in the left ilial lesion does not appear to be a true progression since the patient's clinical symptoms have much improved after XRT it  might suggest a post CT pre XRT increase in size or pseudo-progression due to tumor infiltrating immune cells.  Patient is status post XRT to her left ilial lesion for palliative control of her symptoms. She has received 9 cycles of Nivolumab. He has tolerated treatment without any prohibitive toxicities. TSH WNL today.  CT chest abdomen pelvis and bone scan 10/21/2015. shows good response to treatment with no evidence of disease progression.  CT chest abd pelvis 01/14/2016- showed continued positive response with no evidence of disease progression.  Plan -recent CT after 13 cycles of Nivoluman continues to show positive response with no evidence of disease progression.  -no significant treatment toxicities noted. -Labs are stable  today and the patient is appropriate to proceed with her 14th cycle of IV Nivolumab as per schedule on 01/28/2016 -plan to continue Nivolumab until disease progression or prohibitive toxicities arise. -continue Xgeva q4weeks  - patient's pain is much improved and she is only rarely using Percocet for intermittent pain from her metastatic left ilium disease. -continue labs q2weeks prior to treatments  #2 Anxiety and depression related to her life-threatening diagnosis of cancer . Patient did not tolerate Remeron due to nightmares  Patient overall is in a much better place now with decrease anxiety and improved sleep. Has been eating well with improvement in her weight. . Wt Readings from Last 3 Encounters:  01/19/16 127 lb 12.8 oz (57.97 kg)  12/17/15 130 lb 3.2 oz (59.058 kg)  11/19/15 123 lb 1.6 oz (55.838 kg)   plan -Continue on Celexa 20 mg by mouth daily    #3 Barrett's esophagus 4cms in the distal esophagus with low and high-grade dysplasia cannot rule out an early intra-mucosal esophageal adenocarcinoma. Plan -Patient being monitored by Dr. [name] from GI. -setup for f/u for further evaluation and management in feb 2017 -will forward my note to Dr  [name]. Given stability of lung cancer it might be reasonable to pursue therapeutic strategies for the patient intramucosal esophageal adenocarcinoma.  #4 Nicotine addiction -counseled the patient in details on the needs/benefit of smoking cessation -given nicotine patch and patient notes she will try to start cutting down her cigarette smoking.   Return to care with Dr. Irene Limbo in 4 weeks with CBC, CMP  All questions were answered. The patient knows to call the clinic with any problems, questions or concerns.  I spent 25 minutes counseling the patient face to face including discussion of her imaging results, treatment plan and smoking cessation .The total time spent in the appointment was 30 minutes and more than 50% was on counseling.  Sullivan Lone MD [place] [facility] Physician [facility]  (Office): [phone] (Work cell): [phone] (Fax): [phone]

## 2016-01-28 ENCOUNTER — Telehealth: Payer: Self-pay | Admitting: *Deleted

## 2016-01-28 ENCOUNTER — Other Ambulatory Visit: Payer: Self-pay | Admitting: *Deleted

## 2016-01-28 ENCOUNTER — Ambulatory Visit: Payer: Medicare Other

## 2016-01-28 ENCOUNTER — Other Ambulatory Visit (HOSPITAL_BASED_OUTPATIENT_CLINIC_OR_DEPARTMENT_OTHER): Payer: Medicare Other

## 2016-01-28 ENCOUNTER — Ambulatory Visit (HOSPITAL_BASED_OUTPATIENT_CLINIC_OR_DEPARTMENT_OTHER): Payer: Medicare Other

## 2016-01-28 VITALS — BP 129/59 | HR 59 | Temp 98.3°F | Resp 18

## 2016-01-28 DIAGNOSIS — Z95828 Presence of other vascular implants and grafts: Secondary | ICD-10-CM

## 2016-01-28 DIAGNOSIS — C3490 Malignant neoplasm of unspecified part of unspecified bronchus or lung: Secondary | ICD-10-CM

## 2016-01-28 DIAGNOSIS — C7951 Secondary malignant neoplasm of bone: Secondary | ICD-10-CM

## 2016-01-28 DIAGNOSIS — Z5112 Encounter for antineoplastic immunotherapy: Secondary | ICD-10-CM

## 2016-01-28 DIAGNOSIS — C3491 Malignant neoplasm of unspecified part of right bronchus or lung: Secondary | ICD-10-CM

## 2016-01-28 LAB — CBC & DIFF AND RETIC
BASO%: 1.2 % (ref 0.0–2.0)
Basophils Absolute: 0.1 10*3/uL (ref 0.0–0.1)
EOS%: 3.8 % (ref 0.0–7.0)
Eosinophils Absolute: 0.2 10*3/uL (ref 0.0–0.5)
HCT: 36.9 % (ref 34.8–46.6)
HGB: 12.1 g/dL (ref 11.6–15.9)
Immature Retic Fract: 1.4 % — ABNORMAL LOW (ref 1.60–10.00)
LYMPH%: 17.2 % (ref 14.0–49.7)
MCH: 29.2 pg (ref 25.1–34.0)
MCHC: 32.8 g/dL (ref 31.5–36.0)
MCV: 88.9 fL (ref 79.5–101.0)
MONO#: 0.4 10*3/uL (ref 0.1–0.9)
MONO%: 6.2 % (ref 0.0–14.0)
NEUT#: 4.3 10*3/uL (ref 1.5–6.5)
NEUT%: 71.6 % (ref 38.4–76.8)
Platelets: 192 10*3/uL (ref 145–400)
RBC: 4.15 10*6/uL (ref 3.70–5.45)
RDW: 14.6 % — ABNORMAL HIGH (ref 11.2–14.5)
Retic %: 1 % (ref 0.70–2.10)
Retic Ct Abs: 41.5 10*3/uL (ref 33.70–90.70)
WBC: 6 10*3/uL (ref 3.9–10.3)
lymph#: 1 10*3/uL (ref 0.9–3.3)

## 2016-01-28 LAB — COMPREHENSIVE METABOLIC PANEL WITH GFR
ALT: 9 U/L (ref 0–55)
AST: 17 U/L (ref 5–34)
Albumin: 3.3 g/dL — ABNORMAL LOW (ref 3.5–5.0)
Alkaline Phosphatase: 81 U/L (ref 40–150)
Anion Gap: 6 meq/L (ref 3–11)
BUN: 13.4 mg/dL (ref 7.0–26.0)
CO2: 25 meq/L (ref 22–29)
Calcium: 8.6 mg/dL (ref 8.4–10.4)
Chloride: 107 meq/L (ref 98–109)
Creatinine: 0.7 mg/dL (ref 0.6–1.1)
EGFR: 80 mL/min/{1.73_m2} — ABNORMAL LOW
Glucose: 85 mg/dL (ref 70–140)
Potassium: 4.2 meq/L (ref 3.5–5.1)
Sodium: 139 meq/L (ref 136–145)
Total Bilirubin: 0.43 mg/dL (ref 0.20–1.20)
Total Protein: 7 g/dL (ref 6.4–8.3)

## 2016-01-28 MED ORDER — SODIUM CHLORIDE 0.9 % IV SOLN
Freq: Once | INTRAVENOUS | Status: AC
  Administered 2016-01-28: 13:00:00 via INTRAVENOUS

## 2016-01-28 MED ORDER — DIPHENHYDRAMINE HCL 50 MG/ML IJ SOLN: 50.0000 mg | Freq: Once | INTRAMUSCULAR | Status: DC | PRN

## 2016-01-28 MED ORDER — HEPARIN SOD (PORK) LOCK FLUSH 100 UNIT/ML IV SOLN
500.0000 [IU] | Freq: Once | INTRAVENOUS | Status: AC | PRN
  Administered 2016-01-28: 500 [IU]
  Filled 2016-01-28: qty 5

## 2016-01-28 MED ORDER — SODIUM CHLORIDE 0.9 % IV SOLN: Freq: Once | INTRAVENOUS | Status: DC | PRN

## 2016-01-28 MED ORDER — EPINEPHRINE HCL 0.1 MG/ML IJ SOLN: 0.2500 mg | Freq: Once | INTRAMUSCULAR | Status: DC | PRN

## 2016-01-28 MED ORDER — DIPHENHYDRAMINE HCL 50 MG/ML IJ SOLN
25.0000 mg | Freq: Once | INTRAMUSCULAR | Status: AC
  Administered 2016-01-28: 25 mg via INTRAVENOUS

## 2016-01-28 MED ORDER — METHYLPREDNISOLONE SODIUM SUCC 125 MG IJ SOLR: 125.0000 mg | Freq: Once | INTRAMUSCULAR | Status: DC | PRN

## 2016-01-28 MED ORDER — DIPHENHYDRAMINE HCL 50 MG/ML IJ SOLN: 25.0000 mg | Freq: Once | INTRAMUSCULAR | Status: DC

## 2016-01-28 MED ORDER — DENOSUMAB 120 MG/1.7ML ~~LOC~~ SOLN
120.0000 mg | Freq: Once | SUBCUTANEOUS | Status: AC
  Administered 2016-01-28: 120 mg via SUBCUTANEOUS
  Filled 2016-01-28: qty 1.7

## 2016-01-28 MED ORDER — FAMOTIDINE IN NACL 20-0.9 MG/50ML-% IV SOLN
INTRAVENOUS | Status: AC
  Filled 2016-01-28: qty 50

## 2016-01-28 MED ORDER — SODIUM CHLORIDE 0.9 % IV SOLN
240.0000 mg | Freq: Once | INTRAVENOUS | Status: AC
  Administered 2016-01-28: 240 mg via INTRAVENOUS
  Filled 2016-01-28: qty 20

## 2016-01-28 MED ORDER — FAMOTIDINE IN NACL 20-0.9 MG/50ML-% IV SOLN
20.0000 mg | Freq: Once | INTRAVENOUS | Status: AC
  Administered 2016-01-28: 20 mg via INTRAVENOUS

## 2016-01-28 MED ORDER — DIPHENHYDRAMINE HCL 50 MG/ML IJ SOLN
INTRAMUSCULAR | Status: AC
  Filled 2016-01-28: qty 1

## 2016-01-28 MED ORDER — ALBUTEROL SULFATE (2.5 MG/3ML) 0.083% IN NEBU
2.5000 mg | INHALATION_SOLUTION | Freq: Once | RESPIRATORY_TRACT | Status: DC | PRN
  Filled 2016-01-28: qty 3

## 2016-01-28 MED ORDER — SODIUM CHLORIDE 0.9% FLUSH
10.0000 mL | INTRAVENOUS | Status: DC | PRN
  Administered 2016-01-28: 10 mL via INTRAVENOUS
  Filled 2016-01-28: qty 10

## 2016-01-28 MED ORDER — SODIUM CHLORIDE 0.9 % IJ SOLN
10.0000 mL | INTRAMUSCULAR | Status: DC | PRN
  Administered 2016-01-28: 10 mL
  Filled 2016-01-28: qty 10

## 2016-01-28 MED ORDER — DIPHENHYDRAMINE HCL 50 MG/ML IJ SOLN: 25.0000 mg | Freq: Once | INTRAMUSCULAR | Status: DC | PRN

## 2016-01-28 NOTE — Telephone Encounter (Signed)
Attempted to schedule patient for endoscopy. She is having a treatment at this time. I will call her back at a later time.  ===View-only below this line===  ----- Message -----    From: [name], MD    Sent: 01/28/2016  12:57 PM      To: Larina Bras, CMA  Got note from patient's onc MD Dr. Irene Limbo She is doing well and has responded well to chemo Needs EGD is she is agreeable, if not agreeable she need ROV with me Dx: Barretts with dysplasia JMP  ----- Message -----    From: [name], MD    Sent: 01/24/2016  12:23 AM      To: [name], MD  Thanks.  Regards,  .[name] MD MS

## 2016-01-28 NOTE — Patient Instructions (Signed)

## 2016-01-28 NOTE — Patient Instructions (Signed)
[facility] Discharge Instructions for Patients Receiving Chemotherapy  Today you received the following chemotherapy agents Opdivo To help prevent nausea and vomiting after your treatment, we encourage you to take your nausea medication as directed. If you develop nausea and vomiting that is not controlled by your nausea medication, call the clinic.   BELOW ARE SYMPTOMS THAT SHOULD BE REPORTED IMMEDIATELY:  *FEVER GREATER THAN 100.5 F  *CHILLS WITH OR WITHOUT FEVER  NAUSEA AND VOMITING THAT IS NOT CONTROLLED WITH YOUR NAUSEA MEDICATION  *UNUSUAL SHORTNESS OF BREATH  *UNUSUAL BRUISING OR BLEEDING  TENDERNESS IN MOUTH AND THROAT WITH OR WITHOUT PRESENCE OF ULCERS  *URINARY PROBLEMS  *BOWEL PROBLEMS  UNUSUAL RASH Items with * indicate a potential emergency and should be followed up as soon as possible.  Denosumab injection What is this medicine? DENOSUMAB (den oh sue mab) slows bone breakdown. Prolia is used to treat osteoporosis in women after menopause and in men. Delton See is used to prevent bone fractures and other bone problems caused by cancer bone metastases. Delton See is also used to treat giant cell tumor of the bone. This medicine may be used for other purposes; ask your health care provider or pharmacist if you have questions. What should I tell my health care provider before I take this medicine? They need to know if you have any of these conditions: -dental disease -eczema -infection or history of infections -kidney disease or on dialysis -low blood calcium or vitamin D -malabsorption syndrome -scheduled to have surgery or tooth extraction -taking medicine that contains denosumab -thyroid or parathyroid disease -an unusual reaction to denosumab, other medicines, foods, dyes, or preservatives -pregnant or trying to get pregnant -breast-feeding How should I use this medicine? This medicine is for injection under the skin. It is given by a health care  professional in a hospital or clinic setting. If you are getting Prolia, a special MedGuide will be given to you by the pharmacist with each prescription and refill. Be sure to read this information carefully each time. For Prolia, talk to your pediatrician regarding the use of this medicine in children. Special care may be needed. For Delton See, talk to your pediatrician regarding the use of this medicine in children. While this drug may be prescribed for children as young as 13 years for selected conditions, precautions do apply. Overdosage: If you think you have taken too much of this medicine contact a poison control center or emergency room at once. NOTE: This medicine is only for you. Do not share this medicine with others. What if I miss a dose? It is important not to miss your dose. Call your doctor or health care professional if you are unable to keep an appointment. What may interact with this medicine? Do not take this medicine with any of the following medications: -other medicines containing denosumab This medicine may also interact with the following medications: -medicines that suppress the immune system -medicines that treat cancer -steroid medicines like prednisone or cortisone This list may not describe all possible interactions. Give your health care provider a list of all the medicines, herbs, non-prescription drugs, or dietary supplements you use. Also tell them if you smoke, drink alcohol, or use illegal drugs. Some items may interact with your medicine. What should I watch for while using this medicine? Visit your doctor or health care professional for regular checks on your progress. Your doctor or health care professional may order blood tests and other tests to see how you are doing. Call  your doctor or health care professional if you get a cold or other infection while receiving this medicine. Do not treat yourself. This medicine may decrease your body's ability to fight  infection. You should make sure you get enough calcium and vitamin D while you are taking this medicine, unless your doctor tells you not to. Discuss the foods you eat and the vitamins you take with your health care professional. See your dentist regularly. Brush and floss your teeth as directed. Before you have any dental work done, tell your dentist you are receiving this medicine. Do not become pregnant while taking this medicine or for 5 months after stopping it. Women should inform their doctor if they wish to become pregnant or think they might be pregnant. There is a potential for serious side effects to an unborn child. Talk to your health care professional or pharmacist for more information. What side effects may I notice from receiving this medicine? Side effects that you should report to your doctor or health care professional as soon as possible: -allergic reactions like skin rash, itching or hives, swelling of the face, lips, or tongue -breathing problems -chest pain -fast, irregular heartbeat -feeling faint or lightheaded, falls -fever, chills, or any other sign of infection -muscle spasms, tightening, or twitches -numbness or tingling -skin blisters or bumps, or is dry, peels, or red -slow healing or unexplained pain in the mouth or jaw -unusual bleeding or bruising Side effects that usually do not require medical attention (Report these to your doctor or health care professional if they continue or are bothersome.): -muscle pain -stomach upset, gas This list may not describe all possible side effects. Call your doctor for medical advice about side effects. You may report side effects to FDA at 1-800-FDA-1088. Where should I keep my medicine? This medicine is only given in a clinic, doctor's office, or other health care setting and will not be stored at home. NOTE: This sheet is a summary. It may not cover all possible information. If you have questions about this medicine, talk  to your doctor, pharmacist, or health care provider.    2016, [employer] Standard. (2012-05-21 12:37:47)   Feel free to call the clinic you have any questions or concerns. The clinic phone number is [phone].  Please show the [facility] at check-in to the Emergency Department and triage nurse.

## 2016-01-29 NOTE — Telephone Encounter (Signed)
Reminder placed.

## 2016-01-29 NOTE — Telephone Encounter (Signed)
Please place recall to ask her again in 4 weeks because this EGD is very important for her given her GI history and findings of last EGD

## 2016-01-29 NOTE — Telephone Encounter (Signed)
Patient states that she will call back to schedule endoscopy. Her husband is having oral surgery on several occasions in the near future so she needs to get him taken care of first.

## 2016-02-05 ENCOUNTER — Other Ambulatory Visit: Payer: Self-pay | Admitting: Hematology

## 2016-02-08 ENCOUNTER — Telehealth: Payer: Self-pay | Admitting: Internal Medicine

## 2016-02-08 NOTE — Telephone Encounter (Signed)
Patient has been scheduled for endoscopy on 02/23/16 with previsit 02/11/16. She verbalizes understanding.

## 2016-02-11 ENCOUNTER — Encounter: Payer: Self-pay | Admitting: Hematology

## 2016-02-11 ENCOUNTER — Other Ambulatory Visit (HOSPITAL_BASED_OUTPATIENT_CLINIC_OR_DEPARTMENT_OTHER): Payer: Medicare Other

## 2016-02-11 ENCOUNTER — Ambulatory Visit (HOSPITAL_BASED_OUTPATIENT_CLINIC_OR_DEPARTMENT_OTHER): Payer: Medicare Other | Admitting: Hematology

## 2016-02-11 ENCOUNTER — Telehealth: Payer: Self-pay | Admitting: Hematology

## 2016-02-11 ENCOUNTER — Telehealth: Payer: Self-pay | Admitting: *Deleted

## 2016-02-11 ENCOUNTER — Ambulatory Visit (AMBULATORY_SURGERY_CENTER): Payer: Self-pay

## 2016-02-11 ENCOUNTER — Ambulatory Visit (HOSPITAL_BASED_OUTPATIENT_CLINIC_OR_DEPARTMENT_OTHER): Payer: Medicare Other

## 2016-02-11 ENCOUNTER — Ambulatory Visit: Payer: Medicare Other

## 2016-02-11 VITALS — BP 152/56 | HR 67 | Temp 97.7°F | Resp 17 | Ht 67.0 in | Wt 128.5 lb

## 2016-02-11 VITALS — Ht 68.0 in | Wt 128.0 lb

## 2016-02-11 DIAGNOSIS — C3490 Malignant neoplasm of unspecified part of unspecified bronchus or lung: Secondary | ICD-10-CM

## 2016-02-11 DIAGNOSIS — K22711 Barrett's esophagus with high grade dysplasia: Secondary | ICD-10-CM

## 2016-02-11 DIAGNOSIS — F329 Major depressive disorder, single episode, unspecified: Secondary | ICD-10-CM

## 2016-02-11 DIAGNOSIS — Z720 Tobacco use: Secondary | ICD-10-CM

## 2016-02-11 DIAGNOSIS — G893 Neoplasm related pain (acute) (chronic): Secondary | ICD-10-CM | POA: Diagnosis not present

## 2016-02-11 DIAGNOSIS — F419 Anxiety disorder, unspecified: Secondary | ICD-10-CM | POA: Diagnosis not present

## 2016-02-11 DIAGNOSIS — C3491 Malignant neoplasm of unspecified part of right bronchus or lung: Secondary | ICD-10-CM

## 2016-02-11 DIAGNOSIS — C7951 Secondary malignant neoplasm of bone: Secondary | ICD-10-CM

## 2016-02-11 DIAGNOSIS — Z5112 Encounter for antineoplastic immunotherapy: Secondary | ICD-10-CM

## 2016-02-11 DIAGNOSIS — F17213 Nicotine dependence, cigarettes, with withdrawal: Secondary | ICD-10-CM

## 2016-02-11 DIAGNOSIS — Z95828 Presence of other vascular implants and grafts: Secondary | ICD-10-CM

## 2016-02-11 LAB — CBC & DIFF AND RETIC
BASO%: 1.2 % (ref 0.0–2.0)
BASOS ABS: 0.1 10*3/uL (ref 0.0–0.1)
EOS%: 2.4 % (ref 0.0–7.0)
Eosinophils Absolute: 0.1 10*3/uL (ref 0.0–0.5)
HEMATOCRIT: 38.8 % (ref 34.8–46.6)
HGB: 12.8 g/dL (ref 11.6–15.9)
Immature Retic Fract: 1.2 % — ABNORMAL LOW (ref 1.60–10.00)
LYMPH%: 18.8 % (ref 14.0–49.7)
MCH: 29.3 pg (ref 25.1–34.0)
MCHC: 33 g/dL (ref 31.5–36.0)
MCV: 88.8 fL (ref 79.5–101.0)
MONO#: 0.4 10*3/uL (ref 0.1–0.9)
MONO%: 8.3 % (ref 0.0–14.0)
NEUT#: 3.4 10*3/uL (ref 1.5–6.5)
NEUT%: 69.3 % (ref 38.4–76.8)
PLATELETS: 162 10*3/uL (ref 145–400)
RBC: 4.37 10*6/uL (ref 3.70–5.45)
RDW: 14.3 % (ref 11.2–14.5)
Retic %: 0.87 % (ref 0.70–2.10)
Retic Ct Abs: 38.02 10*3/uL (ref 33.70–90.70)
WBC: 5 10*3/uL (ref 3.9–10.3)
lymph#: 0.9 10*3/uL (ref 0.9–3.3)

## 2016-02-11 LAB — COMPREHENSIVE METABOLIC PANEL
ALT: <9 U/L (ref 0–55)
ANION GAP: 8 meq/L (ref 3–11)
AST: 17 U/L (ref 5–34)
Albumin: 3.5 g/dL (ref 3.5–5.0)
Alkaline Phosphatase: 71 U/L (ref 40–150)
BUN: 13.8 mg/dL (ref 7.0–26.0)
CALCIUM: 9.1 mg/dL (ref 8.4–10.4)
CHLORIDE: 107 meq/L (ref 98–109)
CO2: 24 mEq/L (ref 22–29)
CREATININE: 0.8 mg/dL (ref 0.6–1.1)
EGFR: 74 mL/min/{1.73_m2} — ABNORMAL LOW (ref >90)
Glucose: 110 mg/dl (ref 70–140)
POTASSIUM: 4 meq/L (ref 3.5–5.1)
Sodium: 138 mEq/L (ref 136–145)
Total Bilirubin: 0.39 mg/dL (ref 0.20–1.20)
Total Protein: 7.3 g/dL (ref 6.4–8.3)

## 2016-02-11 MED ORDER — NICOTINE 10 MG IN INHA: 1.0000 | RESPIRATORY_TRACT | Status: DC | PRN

## 2016-02-11 MED ORDER — SODIUM CHLORIDE 0.9% FLUSH
10.0000 mL | INTRAVENOUS | Status: DC | PRN
  Administered 2016-02-11: 10 mL via INTRAVENOUS
  Filled 2016-02-11: qty 10

## 2016-02-11 MED ORDER — SODIUM CHLORIDE 0.9 % IV SOLN
Freq: Once | INTRAVENOUS | Status: AC
  Administered 2016-02-11: 14:00:00 via INTRAVENOUS

## 2016-02-11 MED ORDER — SODIUM CHLORIDE 0.9 % IV SOLN
240.0000 mg | Freq: Once | INTRAVENOUS | Status: AC
  Administered 2016-02-11: 240 mg via INTRAVENOUS
  Filled 2016-02-11: qty 8

## 2016-02-11 MED ORDER — SODIUM CHLORIDE 0.9 % IJ SOLN
10.0000 mL | INTRAMUSCULAR | Status: DC | PRN
  Administered 2016-02-11: 10 mL
  Filled 2016-02-11: qty 10

## 2016-02-11 MED ORDER — HEPARIN SOD (PORK) LOCK FLUSH 100 UNIT/ML IV SOLN
500.0000 [IU] | Freq: Once | INTRAVENOUS | Status: AC | PRN
  Administered 2016-02-11: 500 [IU]
  Filled 2016-02-11: qty 5

## 2016-02-11 NOTE — Patient Instructions (Signed)
[facility] Discharge Instructions for Patients Receiving Chemotherapy  Today you received the following chemotherapy agents Opdivo To help prevent nausea and vomiting after your treatment, we encourage you to take your nausea medication as directed. If you develop nausea and vomiting that is not controlled by your nausea medication, call the clinic.   BELOW ARE SYMPTOMS THAT SHOULD BE REPORTED IMMEDIATELY:  *FEVER GREATER THAN 100.5 F  *CHILLS WITH OR WITHOUT FEVER  NAUSEA AND VOMITING THAT IS NOT CONTROLLED WITH YOUR NAUSEA MEDICATION  *UNUSUAL SHORTNESS OF BREATH  *UNUSUAL BRUISING OR BLEEDING  TENDERNESS IN MOUTH AND THROAT WITH OR WITHOUT PRESENCE OF ULCERS  *URINARY PROBLEMS  *BOWEL PROBLEMS  UNUSUAL RASH Items with * indicate a potential emergency and should be followed up as soon as possible.  Denosumab injection What is this medicine? DENOSUMAB (den oh sue mab) slows bone breakdown. Prolia is used to treat osteoporosis in women after menopause and in men. Delton See is used to prevent bone fractures and other bone problems caused by cancer bone metastases. Delton See is also used to treat giant cell tumor of the bone. This medicine may be used for other purposes; ask your health care provider or pharmacist if you have questions. What should I tell my health care provider before I take this medicine? They need to know if you have any of these conditions: -dental disease -eczema -infection or history of infections -kidney disease or on dialysis -low blood calcium or vitamin D -malabsorption syndrome -scheduled to have surgery or tooth extraction -taking medicine that contains denosumab -thyroid or parathyroid disease -an unusual reaction to denosumab, other medicines, foods, dyes, or preservatives -pregnant or trying to get pregnant -breast-feeding How should I use this medicine? This medicine is for injection under the skin. It is given by a health care  professional in a hospital or clinic setting. If you are getting Prolia, a special MedGuide will be given to you by the pharmacist with each prescription and refill. Be sure to read this information carefully each time. For Prolia, talk to your pediatrician regarding the use of this medicine in children. Special care may be needed. For Delton See, talk to your pediatrician regarding the use of this medicine in children. While this drug may be prescribed for children as young as 13 years for selected conditions, precautions do apply. Overdosage: If you think you have taken too much of this medicine contact a poison control center or emergency room at once. NOTE: This medicine is only for you. Do not share this medicine with others. What if I miss a dose? It is important not to miss your dose. Call your doctor or health care professional if you are unable to keep an appointment. What may interact with this medicine? Do not take this medicine with any of the following medications: -other medicines containing denosumab This medicine may also interact with the following medications: -medicines that suppress the immune system -medicines that treat cancer -steroid medicines like prednisone or cortisone This list may not describe all possible interactions. Give your health care provider a list of all the medicines, herbs, non-prescription drugs, or dietary supplements you use. Also tell them if you smoke, drink alcohol, or use illegal drugs. Some items may interact with your medicine. What should I watch for while using this medicine? Visit your doctor or health care professional for regular checks on your progress. Your doctor or health care professional may order blood tests and other tests to see how you are doing. Call  your doctor or health care professional if you get a cold or other infection while receiving this medicine. Do not treat yourself. This medicine may decrease your body's ability to fight  infection. You should make sure you get enough calcium and vitamin D while you are taking this medicine, unless your doctor tells you not to. Discuss the foods you eat and the vitamins you take with your health care professional. See your dentist regularly. Brush and floss your teeth as directed. Before you have any dental work done, tell your dentist you are receiving this medicine. Do not become pregnant while taking this medicine or for 5 months after stopping it. Women should inform their doctor if they wish to become pregnant or think they might be pregnant. There is a potential for serious side effects to an unborn child. Talk to your health care professional or pharmacist for more information. What side effects may I notice from receiving this medicine? Side effects that you should report to your doctor or health care professional as soon as possible: -allergic reactions like skin rash, itching or hives, swelling of the face, lips, or tongue -breathing problems -chest pain -fast, irregular heartbeat -feeling faint or lightheaded, falls -fever, chills, or any other sign of infection -muscle spasms, tightening, or twitches -numbness or tingling -skin blisters or bumps, or is dry, peels, or red -slow healing or unexplained pain in the mouth or jaw -unusual bleeding or bruising Side effects that usually do not require medical attention (Report these to your doctor or health care professional if they continue or are bothersome.): -muscle pain -stomach upset, gas This list may not describe all possible side effects. Call your doctor for medical advice about side effects. You may report side effects to FDA at 1-800-FDA-1088. Where should I keep my medicine? This medicine is only given in a clinic, doctor's office, or other health care setting and will not be stored at home. NOTE: This sheet is a summary. It may not cover all possible information. If you have questions about this medicine, talk  to your doctor, pharmacist, or health care provider.    2016, [employer] Standard. (2012-05-21 12:37:47)   Feel free to call the clinic you have any questions or concerns. The clinic phone number is [phone].  Please show the [facility] at check-in to the Emergency Department and triage nurse.

## 2016-02-11 NOTE — Patient Instructions (Signed)

## 2016-02-11 NOTE — Progress Notes (Signed)
No egg or soy allergies Not on home 02 No previous anesthesia complications No diet or weight loss meds 

## 2016-02-11 NOTE — Telephone Encounter (Signed)
Per staff message and POF I have scheduled appts. Advised scheduler of appts. JMW  

## 2016-02-11 NOTE — Telephone Encounter (Signed)
per pof to sch pt appt-sent MW email to sch trmt-pt to get updated copy of avs b4 leaving trmt

## 2016-02-12 ENCOUNTER — Telehealth: Payer: Self-pay

## 2016-02-12 NOTE — Progress Notes (Signed)
[name]    HEMATOLOGY ONCOLOGY PROGRESS NOTE  Date of service:  .02/11/2016  Patient Care Team: Tamsen Roers, MD as PCP - General (Family Medicine) [name], MD as Consulting Physician (Hematology and Oncology)  CHIEF COMPLAINTS/PURPOSE OF CONSULTATION: Follow-up for metastatic lung cancer  DIAGNOSIS:   #1 Metastatic non-small cell lung cancer with bilateral lung nodules and large metastatic lesion in the left Ilium. #2 Barrett's esophagus with some evidence of intramucosal adenocarcinoma of the esophagus. (being managed and followed by Dr [name]- Gastroenterology)  Treatment  1 Palliative radiation therapy to the large left ilium metastases 2. IV Nivolumab x 15ycles 3. Xgeva 133m [place] q4weeks for bone metastases.   HISTORY OF PRESENTING ILLNESS: (plz see my previous consultation for details of initial presentation)  INTERVAL HISTORY  Cindy TBoodyis here for her scheduled follow-up prior to the 16th cycle of Nivolumab. She notes that she is feeling very well and has become much more active. Notes that she is eating well.  She has found the nicotine patch very helpful and has reduced her smoking from 1-2 packs a day to about 5-6 cigarettes a day. She wonders if she can use something else in addition to the patch. She was willing to try to control inhaler cartridge. We recommended she not overdo it due to risk of hypertension. She notes that she has a follow-up with Dr.Pyrtle  For a rpt EGD on 02/23/2016 to reassess her Barrett's esophagus with? Intramucosal adenocarcinoma for further evaluation and management. No other acute new focal symptoms.   MEDICAL HISTORY:  Past Medical History  Diagnosis Date  . IBS (irritable bowel syndrome)   . Bone neoplasm 06/24/2015  . GERD (gastroesophageal reflux disease)   . Vitamin D deficiency disease   . Cigarette smoker two packs a day or less     Currently still smoking 2 PPD - Not interested in quitting at this time.  . Colon polyps       hyperplastic, tubular adenomas, tubulovillous adenoma  . Endometriosis     Hysterectomy with BSO at age [age]yrs  . H/O: pneumonia   . Heavy smoker (more than 20 cigarettes per day) 06/24/2015  . Depression 06/24/2015  . Hypertension 06/24/2015    likely improved incidental to 40 lbs weight loss from her neoplasm. No Longer taking med for this as of 08-06-15  . Cancer ([facility]     metastatic poorly differentiated carcinoma. tumor left groin surgical removal with radiation tx.  . Cough, persistent     hx. lung cancer ? primary-being evaluated, unsure of primary site.  . Pain     left hip-persistent"tumor of bone"-radiation tx. 10.  . Swelling of ankle     bilateral  . Pulmonary nodules   . Barrett's esophagus   . Hiatal hernia   . Gastritis   . Esophageal adenocarcinoma ([facility] 08/11/15    intramucosal  . Diverticulosis    SURGICAL HISTORY: Past Surgical History  Procedure Laterality Date  . Abdominal hysterectomy    . Tonsillectomy    . Ganglion cyst excision    . Total abdominal hysterectomy w/ bilateral salpingoophorectomy  at age [age]yrs    For endometriosis  . Knee arthroscopy  age about 89yrs  . Bartholin gland cyst excision  75yo ago    Does not want if it was an infected cyst or tumor. Was soon as delivery  . Colonoscopy w/ polypectomy      multiple times - last done 09/2014 per patient.  .[name]  Esophagogastroduodenoscopy (egd) with propofol N/A 08/11/2015    Procedure: ESOPHAGOGASTRODUODENOSCOPY (EGD) WITH PROPOFOL;  Surgeon: [name], MD;  Location: [facility];  Service: Gastroenterology;  Laterality: N/A;    SOCIAL HISTORY: Social History   Social History  . Marital Status: Widowed    Spouse Name: N/A  . Number of Children: 2  . Years of Education: N/A   Occupational History  . Not on file.   Social History Main Topics  . Smoking status: Current Every Day Smoker -- 1.00 packs/day for 60 years    Types: Cigarettes  . Smokeless tobacco: Never Used  . Alcohol  Use: No  . Drug Use: No  . Sexual Activity: No   Other Topics Concern  . Not on file   Social History Narrative    FAMILY HISTORY: Family History  Problem Relation Age of Onset  . Stroke Mother   . Colon cancer Father   . Colon cancer Brother   . Colon cancer Brother   . Breast cancer Daughter 45    ER/PR+ stage II    ALLERGIES:  is allergic to penicillins; remeron; and latex. patient wonders if she has a penicillin allergy but notes that she is uncertain about this.  MEDICATIONS:  Current Outpatient Prescriptions  Medication Sig Dispense Refill  . amLODipine (NORVASC) 5 MG tablet TAKE 1 TABLET (5 MG TOTAL) BY MOUTH DAILY. 30 tablet 1  . artificial tears (LACRILUBE) OINT ophthalmic ointment Place into both eyes at bedtime. 1 Tube 1  . citalopram (CELEXA) 20 MG tablet Take 1 tablet (20 mg total) by mouth daily. 30 tablet 3  . docusate sodium (COLACE) 100 MG capsule Take 100 mg by mouth daily as needed for mild constipation. Reported on 02/11/2016    . Hypromellose (ARTIFICIAL TEARS) 0.4 % SOLN Apply 1 drop to eye QID. (Patient not taking: Reported on 02/11/2016) 1 Bottle 1  . lidocaine-prilocaine (EMLA) cream Apply small amount over port 1-2 hours prior to treatment, cover with plastic wrap (DO NOT RUB IN). 30 g prn  . nicotine (EQ NICOTINE) 21 mg/24hr patch Place 1 patch (21 mg total) onto the skin daily. 28 patch 0  . nicotine (NICOTROL) 10 MG inhaler Inhale 1 cartridge (1 continuous puffing total) into the lungs as needed for smoking cessation. 42 each 0  . omeprazole (PRILOSEC) 40 MG capsule TAKE ONE CAPSULE BY MOUTH EVERY DAY 30 capsule 0  . oxyCODONE-acetaminophen (PERCOCET/ROXICET) 5-325 MG tablet Take 1 tablet by mouth every 4 (four) hours as needed for severe pain. 60 tablet 0  . polycarbophil (FIBERCON) 625 MG tablet Take 2 tablets (1,250 mg total) by mouth 2 (two) times daily. 120 tablet 1  . PRESCRIPTION MEDICATION Antibody Plan [facility]    . valsartan (DIOVAN) 80 MG tablet       No current facility-administered medications for this visit.    REVIEW OF SYSTEMS:    10 point review of systems done and was noted to be negative except as noted above.  PHYSICAL EXAMINATION: ECOG PERFORMANCE STATUS: 2 - Symptomatic, <50% confined to bed  Filed Vitals:   02/11/16 1230  BP: 152/56  Pulse: 67  Temp: 97.7 F (36.5 C)  Resp: 17   Filed Weights   02/11/16 1230  Weight: 128 lb 8 oz (58.287 kg)  . Wt Readings from Last 3 Encounters:  02/11/16 128 lb (58.06 kg)  02/11/16 128 lb 8 oz (58.287 kg)  01/19/16 127 lb 12.8 oz (57.97 kg)   GENERAL:alert, NAD, appears well.  SKIN: skin color, texture, turgor are normal, no rashes or significant lesions EYES: normal, conjunctiva are pink and non-injected, sclera clear OROPHARYNX:no exudate, no erythema and lips, buccal mucosa, and tongue normal  NECK: supple, thyroid normal size, non-tender, without nodularity LYMPH:  no palpable lymphadenopathy in the cervical, axillary or inguinal LUNGS: clear to auscultation and bilateral reduced breath sounds with normal respiratory effort  HEART: regular rate & rhythm and no murmurs and resolved lower extremity edema ABDOMEN:abdomen soft, non-tender and normal bowel sounds Musculoskeletal: No pedal edema. No calf pain or tenderness.  PSYCH: alert & oriented x 3 with fluent speech NEURO: no focal motor/sensory deficits.  LABORATORY DATA:  I have reviewed the data as listed . CBC Latest Ref Rng 02/11/2016 01/28/2016 01/14/2016  WBC 3.9 - 10.3 10e3/uL 5.0 6.0 5.9  Hemoglobin 11.6 - 15.9 g/dL 12.8 12.1 12.6  Hematocrit 34.8 - 46.6 % 38.8 36.9 38.3  Platelets 145 - 400 10e3/uL 162 192 165   . CMP Latest Ref Rng 02/11/2016 01/28/2016 01/14/2016  Glucose 70 - 140 mg/dl 110 85 94  BUN 7.0 - 26.0 mg/dL 13.8 13.4 13.1  Creatinine 0.6 - 1.1 mg/dL 0.8 0.7 0.7  Sodium 136 - 145 mEq/L 138 139 141  Potassium 3.5 - 5.1 mEq/L 4.0 4.2 3.8  CO2 22 - 29 mEq/L _0 Calcium 8.4 - 10.4 mg/dL 9.1  8.6 9.2  Total Protein 6.4 - 8.3 g/dL 7.3 7.0 7.4  Total Bilirubin 0.20 - 1.20 mg/dL 0.39 0.43 0.35  Alkaline Phos 40 - 150 U/L 71 81 92  AST 5 - 34 U/L _1 ALT 0 - 55 U/L <_2 RADIOGRAPHIC STUDIES: I have personally reviewed the radiological images as listed and agreed with the findings in the report. Ct Chest W Contrast  01/14/2016  CLINICAL DATA:  75 year old female with history of metastatic lung cancer to the bones diagnosed in July 2016. Chemotherapy ongoing. EXAM: CT CHEST, ABDOMEN, AND PELVIS WITH CONTRAST TECHNIQUE: Multidetector CT imaging of the chest, abdomen and pelvis was performed following the standard protocol during bolus administration of intravenous contrast. CONTRAST:  140m OMNIPAQUE IOHEXOL 300 MG/ML  SOLN COMPARISON:  CT of the chest, abdomen and pelvis 10/21/2015. FINDINGS: CT CHEST FINDINGS Mediastinum/Lymph Nodes: Heart size is normal. There is no significant pericardial fluid, thickening or pericardial calcification. No pathologically enlarged mediastinal or hilar lymph nodes. Right internal jugular single-lumen porta cath with tip terminating at the superior cavoatrial junction. Esophagus is unremarkable in appearance. No axillary lymphadenopathy. Lungs/Pleura: Previously noted left lower lobe nodule appears slightly decreased in size compared to the prior study, measuring 12 x 4 mm (image 33 of series 5) on today's examination. The nearby left lower lobe nodule associated with the left major fissure is stable in size measuring 4 mm) ground-glass attenuation nodule on image 31 of series 5). 6 mm right lower lobe ground-glass attenuation nodule (image 36 of series 5) appears slightly less distinct than the prior examination. No other new or enlarging suspicious appearing pulmonary nodules or masses are noted on today's examination. There is some mild scarring in the lower lobes of the lungs bilaterally near the base. No acute consolidative airspace disease. No  pleural effusions. Mild diffuse bronchial wall thickening with mild centrilobular emphysema. Musculoskeletal/Soft Tissues: There are no aggressive appearing lytic or blastic lesions noted in the visualized portions of the skeleton. CT ABDOMEN AND PELVIS FINDINGS Hepatobiliary: Multiple well-defined low-attenuation hepatic lesions are unchanged in size, number  and distribution. The largest of these measure up to 1.3 cm in the central aspect of segment 4B of the liver, and the largest lesions are compatible with simple cysts. The smaller lesions are too small to definitively characterize, but are similar prior examinations, favored to represent tiny cysts or biliary hamartomas. No new suspicious hepatic lesions are noted. No intra or extrahepatic biliary ductal dilatation. Gallbladder is normal in appearance. Pancreas: No pancreatic mass. No pancreatic ductal dilatation. No pancreatic or peripancreatic fluid or inflammatory changes. Spleen: Unremarkable. Adrenals/Urinary Tract: Bilateral adrenal glands and bilateral kidneys are normal in appearance. No hydroureteronephrosis. Urinary bladder is normal in appearance. Stomach/Bowel: Normal appearance of the stomach. No pathologic dilatation of small bowel or colon. Several colonic diverticulae are noted, without surrounding inflammatory changes to suggest an acute diverticulitis at this time. Normal appendix. Vascular/Lymphatic: Atherosclerosis throughout the abdominal and pelvic vasculature, without evidence of aneurysm or dissection. No lymphadenopathy noted in the abdomen or pelvis. Reproductive: Status post hysterectomy. Ovaries are not confidently identified and may be surgically absent or atrophic. Other: No significant volume of ascites.  No pneumoperitoneum. Musculoskeletal: Large lytic lesion in the left ilium with extension into the overlying iliacus and gluteal musculature is again noted. This appears slightly less expansile than the prior examination and  demonstrates increasing sclerosis, which suggests a partial response to therapy. Multiple pathologic fractures are noted through this lesion, similar to the prior examination. No new osseous lesions are otherwise noted. IMPRESSION: 1. Today's study demonstrates a positive response to therapy. Specifically, the left lower lobe nodule is smaller than prior examinations, and the other previously noted small pulmonary nodules are slightly less apparent or stable when compared to prior examinations. Additionally, the larger osseous metastasis in the left iliac bone demonstrates less of a soft tissue component and show some interval bone formation, suggesting a positive response to therapy. 2. No new signs of metastatic disease are noted elsewhere in the chest, abdomen or pelvis. 3. Atherosclerosis. 4. Additional incidental findings, as above. Electronically Signed   By: Vinnie Langton M.D.   On: 01/14/2016 10:15   Ct Abdomen Pelvis W Contrast  01/14/2016  CLINICAL DATA:  75 year old female with history of metastatic lung cancer to the bones diagnosed in July 2016. Chemotherapy ongoing. EXAM: CT CHEST, ABDOMEN, AND PELVIS WITH CONTRAST TECHNIQUE: Multidetector CT imaging of the chest, abdomen and pelvis was performed following the standard protocol during bolus administration of intravenous contrast. CONTRAST:  121m OMNIPAQUE IOHEXOL 300 MG/ML  SOLN COMPARISON:  CT of the chest, abdomen and pelvis 10/21/2015. FINDINGS: CT CHEST FINDINGS Mediastinum/Lymph Nodes: Heart size is normal. There is no significant pericardial fluid, thickening or pericardial calcification. No pathologically enlarged mediastinal or hilar lymph nodes. Right internal jugular single-lumen porta cath with tip terminating at the superior cavoatrial junction. Esophagus is unremarkable in appearance. No axillary lymphadenopathy. Lungs/Pleura: Previously noted left lower lobe nodule appears slightly decreased in size compared to the prior study,  measuring 12 x 4 mm (image 33 of series 5) on today's examination. The nearby left lower lobe nodule associated with the left major fissure is stable in size measuring 4 mm) ground-glass attenuation nodule on image 31 of series 5). 6 mm right lower lobe ground-glass attenuation nodule (image 36 of series 5) appears slightly less distinct than the prior examination. No other new or enlarging suspicious appearing pulmonary nodules or masses are noted on today's examination. There is some mild scarring in the lower lobes of the lungs bilaterally near the base. No acute  consolidative airspace disease. No pleural effusions. Mild diffuse bronchial wall thickening with mild centrilobular emphysema. Musculoskeletal/Soft Tissues: There are no aggressive appearing lytic or blastic lesions noted in the visualized portions of the skeleton. CT ABDOMEN AND PELVIS FINDINGS Hepatobiliary: Multiple well-defined low-attenuation hepatic lesions are unchanged in size, number and distribution. The largest of these measure up to 1.3 cm in the central aspect of segment 4B of the liver, and the largest lesions are compatible with simple cysts. The smaller lesions are too small to definitively characterize, but are similar prior examinations, favored to represent tiny cysts or biliary hamartomas. No new suspicious hepatic lesions are noted. No intra or extrahepatic biliary ductal dilatation. Gallbladder is normal in appearance. Pancreas: No pancreatic mass. No pancreatic ductal dilatation. No pancreatic or peripancreatic fluid or inflammatory changes. Spleen: Unremarkable. Adrenals/Urinary Tract: Bilateral adrenal glands and bilateral kidneys are normal in appearance. No hydroureteronephrosis. Urinary bladder is normal in appearance. Stomach/Bowel: Normal appearance of the stomach. No pathologic dilatation of small bowel or colon. Several colonic diverticulae are noted, without surrounding inflammatory changes to suggest an acute  diverticulitis at this time. Normal appendix. Vascular/Lymphatic: Atherosclerosis throughout the abdominal and pelvic vasculature, without evidence of aneurysm or dissection. No lymphadenopathy noted in the abdomen or pelvis. Reproductive: Status post hysterectomy. Ovaries are not confidently identified and may be surgically absent or atrophic. Other: No significant volume of ascites.  No pneumoperitoneum. Musculoskeletal: Large lytic lesion in the left ilium with extension into the overlying iliacus and gluteal musculature is again noted. This appears slightly less expansile than the prior examination and demonstrates increasing sclerosis, which suggests a partial response to therapy. Multiple pathologic fractures are noted through this lesion, similar to the prior examination. No new osseous lesions are otherwise noted. IMPRESSION: 1. Today's study demonstrates a positive response to therapy. Specifically, the left lower lobe nodule is smaller than prior examinations, and the other previously noted small pulmonary nodules are slightly less apparent or stable when compared to prior examinations. Additionally, the larger osseous metastasis in the left iliac bone demonstrates less of a soft tissue component and show some interval bone formation, suggesting a positive response to therapy. 2. No new signs of metastatic disease are noted elsewhere in the chest, abdomen or pelvis. 3. Atherosclerosis. 4. Additional incidental findings, as above. Electronically Signed   By: Vinnie Langton M.D.   On: 01/14/2016 10:15    ASSESSMENT & PLAN:   #1 Metastatic poorly differentiated carcinoma with likely lung primary [non-small cell lung cancer].  CT of the head with and without contrast showed no evidence of metastatic disease. Patient notes much improved pain control. EGFR blood test mutation analysis negative. Patient's pain is much better controlled after radiation for the painful ilium met.  CT chest abdomen  pelvis on 08/20/2015 showed improvement in the mediastinal adenopathy, stable lung nodules and some increase in the size of her left ilial lesion.  The increase in the left ilial lesion does not appear to be a true progression since the patient's clinical symptoms have much improved after XRT it might suggest a post CT pre XRT increase in size or pseudo-progression due to tumor infiltrating immune cells.  Patient is status post XRT to her left ilial lesion for palliative control of her symptoms. She has received 9 cycles of Nivolumab. He has tolerated treatment without any prohibitive toxicities. TSH WNL today.  CT chest abdomen pelvis and bone scan 10/21/2015. shows good response to treatment with no evidence of disease progression.  CT chest abd  pelvis 01/14/2016- showed continued positive response with no evidence of disease progression.  Plan -no significant treatment toxicities noted. -Labs are stable today and the patient is appropriate to proceed with her 16th cycle of IV Nivolumab as per schedule on 01/28/2016 -plan to continue Nivolumab until disease progression or prohibitive toxicities arise. -continue Xgeva q4weeks  - patient's pain is much improved and she is only rarely using Percocet for intermittent pain from her metastatic left ilium disease. -continue labs q2weeks prior to treatments  #2 Anxiety and depression related to her life-threatening diagnosis of cancer . Patient did not tolerate Remeron due to nightmares  Patient overall is in a much better place now with decrease anxiety and improved sleep. Has been eating well with improvement in her weight. . Wt Readings from Last 3 Encounters:  02/11/16 128 lb (58.06 kg)  02/11/16 128 lb 8 oz (58.287 kg)  01/19/16 127 lb 12.8 oz (57.97 kg)   plan -Continue on Celexa 20 mg by mouth daily    #3 Barrett's esophagus 4cms in the distal esophagus with low and high-grade dysplasia cannot rule out an early intra-mucosal  esophageal adenocarcinoma. Plan -Patient being monitored by Dr. [name] from GI. -She has follow-up with EGD on 02/23/2016.  #4 Nicotine addiction - has cut down her smoking from 1-2 packs of cigarettes per day to about 5-6 cigarettes a day. -Counseled on absolute smoking cessation. -Continue nicotine patch 21 mg/24h for 2 more weeks then down to 14 mg/24h for 4 weeks and then 7 mg per 24 hours for 2 weeks. -Nicotrol Inhaler given for use when necessary.  Return to care with Dr. Irene Limbo in 4 weeks with CBC, CMP  All questions were answered. The patient knows to call the clinic with any problems, questions or concerns.  I spent 15 minutes counseling the patient face to face including discussion of her imaging results, treatment plan and smoking cessation .The total time spent in the appointment was 15 minutes and more than 50% was on counseling.  Sullivan Lone MD [facility]Oncology Physician [facility]  (Office): [phone] (Work cell): [phone] (Fax): [phone]

## 2016-02-12 NOTE — Telephone Encounter (Signed)
Pt with metastatic lung cancer is scheduled for EGD 02/23/16 at [facility] Just making sure you want her procedure scheduled at [facility]. Pt stated last time she had it scheduled at [facility]. Also, she needs her prescription for Omeprazole refilled. Thanks Drystan Reader-PV

## 2016-02-12 NOTE — Telephone Encounter (Signed)
[place] for [employer]

## 2016-02-21 ENCOUNTER — Other Ambulatory Visit: Payer: Self-pay | Admitting: Internal Medicine

## 2016-02-23 ENCOUNTER — Encounter: Payer: Self-pay | Admitting: Internal Medicine

## 2016-02-23 ENCOUNTER — Ambulatory Visit (AMBULATORY_SURGERY_CENTER): Payer: Medicare Other | Admitting: Internal Medicine

## 2016-02-23 VITALS — BP 137/82 | HR 60 | Temp 98.4°F | Resp 23 | Ht 67.0 in | Wt 128.0 lb

## 2016-02-23 DIAGNOSIS — K22711 Barrett's esophagus with high grade dysplasia: Secondary | ICD-10-CM | POA: Diagnosis not present

## 2016-02-23 MED ORDER — SODIUM CHLORIDE 0.9 % IV SOLN
500.0000 mL | INTRAVENOUS | Status: DC
Start: 2016-02-23 — End: 2016-02-23

## 2016-02-23 NOTE — Progress Notes (Signed)
Path specimens sent [facility] per Dr [name]

## 2016-02-23 NOTE — Op Note (Signed)
Cindy Byrd Procedure Date: 02/23/2016 1:26 PM MRN: [id number] Endoscopist: [name] , MD Age: 75 Referring MD:  Date of Birth: 1941/09/23 Gender: Female Procedure:                Upper GI endoscopy Indications:              Barrett's esophagus, with at least high grade                            dysplasia, carcinoma in situ versus early                            adenocarcinoma from Sept 2016 (further                            evaluation/treatment delayed due to treatment for                            metastatic non-small cell lung cancer) Medicines:                Monitored Anesthesia Care Procedure:                Pre-Anesthesia Assessment:                           - Prior to the procedure, a History and Physical                            was performed, and patient medications and                            allergies were reviewed. The patient's tolerance of                            previous anesthesia was also reviewed. The risks                            and benefits of the procedure and the sedation                            options and risks were discussed with the patient.                            All questions were answered, and informed consent                            was obtained. Prior Anticoagulants: The patient has                            taken no previous anticoagulant or antiplatelet                            agents. ASA Grade Assessment: III - A patient with  severe systemic disease. After reviewing the risks                            and benefits, the patient was deemed in                            satisfactory condition to undergo the procedure.                           After obtaining informed consent, the endoscope was                            passed under direct vision. Throughout the                            procedure, the patient's blood pressure, pulse, and                   oxygen saturations were monitored continuously. The                            Model GIF-[id number]) scope was introduced                            through the mouth, and advanced to the second part                            of duodenum. The upper GI endoscopy was                            accomplished without difficulty. The patient                            tolerated the procedure well. Scope In: Scope Out: Findings:      The esophagus and gastroesophageal junction were examined with white       light and narrow band imaging (NBI) from a forward view and retroflexed       position. There were esophageal mucosal changes classified as Barrett's       stage C4-M5 per Prague criteria. These changes involved the mucosa at       the upper extent of the gastric folds (39 cm from the incisors)       extending to the Z-line (34 cm from the incisors). Nodular mucosa was       present at 37 cm. The maximum longitudinal extent of these esophageal       mucosal changes was 5 cm in length. Mucosa was biopsied with a cold       forceps for histology in a targeted manner and in 4 quadrants at       intervals of 2 cm. The following biopsy specimens were sent to       pathology: targeted biopsy of esophageal nodule at 37 cm from the       incisors (1st Bottle), 4 quadrant biopsy at 39 cm from the incisors (2nd       Bottle), 4 quadrant biopsy at 36 cm from the incisors (3rd Bottle) and 4  quadrant biopsy at 34 cm from the incisors (4th Bottle). A total of 4       specimen bottles were sent to pathology.      A 3 cm hiatal hernia was present.      Scattered minimal inflammation characterized by erythema was found in       the gastric antrum.      The examined duodenum was normal. Complications:            No immediate complications. Estimated Blood Loss:     Estimated blood loss was minimal. Impression:               - Esophageal mucosal changes classified as                             Barrett's stage C4-M5 per Prague criteria. Biopsied.                           - Esophageal nodule within Barrett's segment at 37                            cm. Biopsied.                           - 3 cm hiatal hernia.                           - Mild gastritis, previously biopsied.                           - Normal examined duodenum. Recommendation:           - Patient has a contact number available for                            emergencies. The signs and symptoms of potential                            delayed complications were discussed with the                            patient. Return to normal activities tomorrow.                            Written discharge instructions were provided to the                            patient.                           - Resume previous diet.                           - Continue present medications.                           - Await pathology results.                           -  Depending on pathology may need referral to [facility]                            GI for Barrett's treatment Procedure Code(s):        --- Professional ---                           [phone], Esophagogastroduodenoscopy, flexible,                            transoral; with biopsy, single or multiple CPT copyright 2016 [employer]. All rights reserved. [name]. [name], MD [name], MD 02/23/2016 2:08:35 PM This report has been signed electronically. Number of Addenda: 0 CC Letter to:             Dr. Irene Limbo Referring MD:      Tamsen Roers

## 2016-02-23 NOTE — Progress Notes (Signed)
Called to room to assist during endoscopic procedure.  Patient ID and intended procedure confirmed with present staff. Received instructions for my participation in the procedure from the performing physician.  

## 2016-02-23 NOTE — Progress Notes (Signed)
Report to PACU, RN, vss, BBS= Clear.  

## 2016-02-23 NOTE — Patient Instructions (Addendum)
YOU HAD AN ENDOSCOPIC PROCEDURE TODAY AT [facility]:   Refer to the procedure report that was given to you for any specific questions about what was found during the examination.  If the procedure report does not answer your questions, please call your gastroenterologist to clarify.  If you requested that your care partner not be given the details of your procedure findings, then the procedure report has been included in a sealed envelope for you to review at your convenience later.  YOU SHOULD EXPECT: Some feelings of bloating in the abdomen. Passage of more gas than usual.  Walking can help get rid of the air that was put into your GI tract during the procedure and reduce the bloating.   Please Note:  You might notice some irritation and congestion in your nose or some drainage.  This is from the oxygen used during your procedure.  There is no need for concern and it should clear up in a day or so.  SYMPTOMS TO REPORT IMMEDIATELY:    Following upper endoscopy (EGD)  Vomiting of blood or coffee ground material  New chest pain or pain under the shoulder blades  Painful or persistently difficult swallowing  New shortness of breath  Fever of 100F or higher  Black, tarry-looking stools  For urgent or emergent issues, a gastroenterologist can be reached at any hour by calling [phone].   DIET: Your first meal following the procedure should be a small meal and then it is ok to progress to your normal diet. Heavy or fried foods are harder to digest and may make you feel nauseous or bloated.  Likewise, meals heavy in dairy and vegetables can increase bloating.  Drink plenty of fluids but you should avoid alcoholic beverages for 24 hours.  ACTIVITY:  You should plan to take it easy for the rest of today and you should NOT DRIVE or use heavy machinery until tomorrow (because of the sedation medicines used during the test).    FOLLOW UP: Our staff will call the number listed  on your records the next business day following your procedure to check on you and address any questions or concerns that you may have regarding the information given to you following your procedure. If we do not reach you, we will leave a message.  However, if you are feeling well and you are not experiencing any problems, there is no need to return our call.  We will assume that you have returned to your regular daily activities without incident.  If any biopsies were taken you will be contacted by phone or by letter within the next 1-3 weeks.  Please call us at [phone] if you have not heard about the biopsies in 3 weeks.    SIGNATURES/CONFIDENTIALITY: You and/or your care partner have signed paperwork which will be entered into your electronic medical record.  These signatures attest to the fact that that the information above on your After Visit Summary has been reviewed and is understood.  Full responsibility of the confidentiality of this discharge information lies with you and/or your care-partner.  If any dysplasia is noted, we would have to treat the Barrett's Dysplasia.   They can take out nodules through the scope.  We will send the biopsies rush, and Dr. [name] will call you with the results. Thank-you for choosing [place] for your healthcare needs today.

## 2016-02-24 ENCOUNTER — Other Ambulatory Visit: Payer: Self-pay | Admitting: Internal Medicine

## 2016-02-24 ENCOUNTER — Telehealth: Payer: Self-pay | Admitting: Emergency Medicine

## 2016-02-24 NOTE — Telephone Encounter (Signed)
Left message, no identifier present, f/u 

## 2016-02-25 ENCOUNTER — Other Ambulatory Visit (HOSPITAL_BASED_OUTPATIENT_CLINIC_OR_DEPARTMENT_OTHER): Payer: Medicare Other

## 2016-02-25 ENCOUNTER — Ambulatory Visit (HOSPITAL_BASED_OUTPATIENT_CLINIC_OR_DEPARTMENT_OTHER): Payer: Medicare Other

## 2016-02-25 ENCOUNTER — Ambulatory Visit: Payer: Medicare Other

## 2016-02-25 DIAGNOSIS — C3490 Malignant neoplasm of unspecified part of unspecified bronchus or lung: Secondary | ICD-10-CM

## 2016-02-25 DIAGNOSIS — Z95828 Presence of other vascular implants and grafts: Secondary | ICD-10-CM

## 2016-02-25 DIAGNOSIS — Z5112 Encounter for antineoplastic immunotherapy: Secondary | ICD-10-CM

## 2016-02-25 DIAGNOSIS — Z79899 Other long term (current) drug therapy: Secondary | ICD-10-CM | POA: Diagnosis not present

## 2016-02-25 DIAGNOSIS — C7951 Secondary malignant neoplasm of bone: Secondary | ICD-10-CM

## 2016-02-25 DIAGNOSIS — C3491 Malignant neoplasm of unspecified part of right bronchus or lung: Secondary | ICD-10-CM | POA: Diagnosis not present

## 2016-02-25 LAB — COMPREHENSIVE METABOLIC PANEL
ALBUMIN: 3.5 g/dL (ref 3.5–5.0)
ALK PHOS: 73 U/L (ref 40–150)
ALT: 10 U/L (ref 0–55)
AST: 18 U/L (ref 5–34)
Anion Gap: 8 mEq/L (ref 3–11)
BUN: 15.8 mg/dL (ref 7.0–26.0)
CALCIUM: 9.2 mg/dL (ref 8.4–10.4)
CO2: 24 mEq/L (ref 22–29)
Chloride: 108 mEq/L (ref 98–109)
Creatinine: 0.8 mg/dL (ref 0.6–1.1)
EGFR: 72 mL/min/{1.73_m2} — AB (ref >90)
Glucose: 96 mg/dl (ref 70–140)
POTASSIUM: 4.1 meq/L (ref 3.5–5.1)
Sodium: 140 mEq/L (ref 136–145)
Total Bilirubin: 0.41 mg/dL (ref 0.20–1.20)
Total Protein: 7.3 g/dL (ref 6.4–8.3)

## 2016-02-25 LAB — CBC & DIFF AND RETIC
BASO%: 1 % (ref 0.0–2.0)
BASOS ABS: 0.1 10*3/uL (ref 0.0–0.1)
EOS%: 1.6 % (ref 0.0–7.0)
Eosinophils Absolute: 0.1 10*3/uL (ref 0.0–0.5)
HEMATOCRIT: 39 % (ref 34.8–46.6)
HGB: 13.1 g/dL (ref 11.6–15.9)
Immature Retic Fract: 2.7 % (ref 1.60–10.00)
LYMPH%: 17.4 % (ref 14.0–49.7)
MCH: 29.6 pg (ref 25.1–34.0)
MCHC: 33.6 g/dL (ref 31.5–36.0)
MCV: 88.2 fL (ref 79.5–101.0)
MONO#: 0.5 10*3/uL (ref 0.1–0.9)
MONO%: 7.7 % (ref 0.0–14.0)
NEUT#: 4.4 10*3/uL (ref 1.5–6.5)
NEUT%: 72.3 % (ref 38.4–76.8)
PLATELETS: 158 10*3/uL (ref 145–400)
RBC: 4.42 10*6/uL (ref 3.70–5.45)
RDW: 13.8 % (ref 11.2–14.5)
Retic %: 0.87 % (ref 0.70–2.10)
Retic Ct Abs: 38.45 10*3/uL (ref 33.70–90.70)
WBC: 6.1 10*3/uL (ref 3.9–10.3)
lymph#: 1.1 10*3/uL (ref 0.9–3.3)

## 2016-02-25 LAB — TSH: TSH: 1.795 m(IU)/L (ref 0.308–3.960)

## 2016-02-25 MED ORDER — SODIUM CHLORIDE 0.9% FLUSH
10.0000 mL | INTRAVENOUS | Status: DC | PRN
  Administered 2016-02-25 (×2): 10 mL via INTRAVENOUS
  Filled 2016-02-25: qty 10

## 2016-02-25 MED ORDER — DIPHENHYDRAMINE HCL 50 MG/ML IJ SOLN
INTRAMUSCULAR | Status: AC
  Filled 2016-02-25: qty 1

## 2016-02-25 MED ORDER — HEPARIN SOD (PORK) LOCK FLUSH 100 UNIT/ML IV SOLN
500.0000 [IU] | Freq: Once | INTRAVENOUS | Status: AC
  Administered 2016-02-25: 500 [IU] via INTRAVENOUS
  Filled 2016-02-25: qty 5

## 2016-02-25 MED ORDER — SODIUM CHLORIDE 0.9 % IV SOLN
Freq: Once | INTRAVENOUS | Status: AC
  Administered 2016-02-25: 14:00:00 via INTRAVENOUS

## 2016-02-25 MED ORDER — NIVOLUMAB CHEMO INJECTION 100 MG/10ML
240.0000 mg | Freq: Once | INTRAVENOUS | Status: AC
  Administered 2016-02-25: 240 mg via INTRAVENOUS
  Filled 2016-02-25: qty 8

## 2016-02-25 MED ORDER — DIPHENHYDRAMINE HCL 50 MG/ML IJ SOLN
25.0000 mg | Freq: Once | INTRAMUSCULAR | Status: AC
Start: 2016-02-25 — End: 2016-02-25
  Administered 2016-02-25: 25 mg via INTRAVENOUS

## 2016-02-25 MED ORDER — DENOSUMAB 120 MG/1.7ML ~~LOC~~ SOLN
120.0000 mg | Freq: Once | SUBCUTANEOUS | Status: AC
  Administered 2016-02-25: 120 mg via SUBCUTANEOUS
  Filled 2016-02-25: qty 1.7

## 2016-02-25 MED ORDER — FAMOTIDINE IN NACL 20-0.9 MG/50ML-% IV SOLN
20.0000 mg | Freq: Once | INTRAVENOUS | Status: AC
  Administered 2016-02-25: 20 mg via INTRAVENOUS

## 2016-02-25 MED ORDER — SODIUM CHLORIDE 0.9% FLUSH
10.0000 mL | INTRAVENOUS | Status: DC | PRN
  Filled 2016-02-25: qty 10

## 2016-02-25 MED ORDER — FAMOTIDINE IN NACL 20-0.9 MG/50ML-% IV SOLN
INTRAVENOUS | Status: AC
  Filled 2016-02-25: qty 50

## 2016-02-25 NOTE — Patient Instructions (Signed)

## 2016-02-25 NOTE — Patient Instructions (Signed)
[facility] Discharge Instructions for Patients Receiving Chemotherapy  Today you received the following chemotherapy agents nivolumab   To help prevent nausea and vomiting after your treatment, we encourage you to take your nausea medication as directed   If you develop nausea and vomiting that is not controlled by your nausea medication, call the clinic.   BELOW ARE SYMPTOMS THAT SHOULD BE REPORTED IMMEDIATELY:  *FEVER GREATER THAN 100.5 F  *CHILLS WITH OR WITHOUT FEVER  NAUSEA AND VOMITING THAT IS NOT CONTROLLED WITH YOUR NAUSEA MEDICATION  *UNUSUAL SHORTNESS OF BREATH  *UNUSUAL BRUISING OR BLEEDING  TENDERNESS IN MOUTH AND THROAT WITH OR WITHOUT PRESENCE OF ULCERS  *URINARY PROBLEMS  *BOWEL PROBLEMS  UNUSUAL RASH Items with * indicate a potential emergency and should be followed up as soon as possible.  Feel free to call the clinic you have any questions or concerns. The clinic phone number is [phone].  

## 2016-03-02 ENCOUNTER — Telehealth: Payer: Self-pay | Admitting: Internal Medicine

## 2016-03-02 NOTE — Telephone Encounter (Signed)
Let pt know that Dr. Samuel Jester office has the records and the doctor is reviewing them as of 02/29/16. Pt should be contacted by phone for the appt.

## 2016-03-05 ENCOUNTER — Other Ambulatory Visit: Payer: Self-pay | Admitting: Hematology

## 2016-03-10 ENCOUNTER — Ambulatory Visit (HOSPITAL_BASED_OUTPATIENT_CLINIC_OR_DEPARTMENT_OTHER): Payer: Medicare Other | Admitting: Hematology

## 2016-03-10 ENCOUNTER — Ambulatory Visit: Payer: Medicare Other

## 2016-03-10 ENCOUNTER — Ambulatory Visit (HOSPITAL_BASED_OUTPATIENT_CLINIC_OR_DEPARTMENT_OTHER): Payer: Medicare Other

## 2016-03-10 ENCOUNTER — Other Ambulatory Visit (HOSPITAL_BASED_OUTPATIENT_CLINIC_OR_DEPARTMENT_OTHER): Payer: Medicare Other

## 2016-03-10 ENCOUNTER — Telehealth: Payer: Self-pay | Admitting: *Deleted

## 2016-03-10 ENCOUNTER — Telehealth: Payer: Self-pay | Admitting: Hematology

## 2016-03-10 ENCOUNTER — Encounter: Payer: Self-pay | Admitting: Hematology

## 2016-03-10 VITALS — BP 152/56 | HR 56 | Temp 97.6°F | Resp 18 | Ht 67.0 in | Wt 136.6 lb

## 2016-03-10 DIAGNOSIS — F419 Anxiety disorder, unspecified: Secondary | ICD-10-CM

## 2016-03-10 DIAGNOSIS — C3490 Malignant neoplasm of unspecified part of unspecified bronchus or lung: Secondary | ICD-10-CM

## 2016-03-10 DIAGNOSIS — Z720 Tobacco use: Secondary | ICD-10-CM

## 2016-03-10 DIAGNOSIS — Z95828 Presence of other vascular implants and grafts: Secondary | ICD-10-CM

## 2016-03-10 DIAGNOSIS — C7951 Secondary malignant neoplasm of bone: Secondary | ICD-10-CM

## 2016-03-10 DIAGNOSIS — Z5112 Encounter for antineoplastic immunotherapy: Secondary | ICD-10-CM | POA: Diagnosis not present

## 2016-03-10 DIAGNOSIS — F17213 Nicotine dependence, cigarettes, with withdrawal: Secondary | ICD-10-CM

## 2016-03-10 LAB — COMPREHENSIVE METABOLIC PANEL
ALT: 15 U/L (ref 0–55)
ANION GAP: 6 meq/L (ref 3–11)
AST: 21 U/L (ref 5–34)
Albumin: 3.4 g/dL — ABNORMAL LOW (ref 3.5–5.0)
Alkaline Phosphatase: 73 U/L (ref 40–150)
BUN: 13.3 mg/dL (ref 7.0–26.0)
CHLORIDE: 110 meq/L — AB (ref 98–109)
CO2: 24 meq/L (ref 22–29)
Calcium: 8.9 mg/dL (ref 8.4–10.4)
Creatinine: 0.8 mg/dL (ref 0.6–1.1)
EGFR: 72 mL/min/{1.73_m2} — AB (ref >90)
GLUCOSE: 90 mg/dL (ref 70–140)
POTASSIUM: 4.2 meq/L (ref 3.5–5.1)
SODIUM: 140 meq/L (ref 136–145)
TOTAL PROTEIN: 7.3 g/dL (ref 6.4–8.3)

## 2016-03-10 LAB — CBC & DIFF AND RETIC
BASO%: 0.9 % (ref 0.0–2.0)
BASOS ABS: 0.1 10*3/uL (ref 0.0–0.1)
EOS%: 2.8 % (ref 0.0–7.0)
Eosinophils Absolute: 0.2 10*3/uL (ref 0.0–0.5)
HCT: 38.2 % (ref 34.8–46.6)
HGB: 12.6 g/dL (ref 11.6–15.9)
Immature Retic Fract: 1.7 % (ref 1.60–10.00)
LYMPH%: 24.3 % (ref 14.0–49.7)
MCH: 29.3 pg (ref 25.1–34.0)
MCHC: 33 g/dL (ref 31.5–36.0)
MCV: 88.8 fL (ref 79.5–101.0)
MONO#: 0.3 10*3/uL (ref 0.1–0.9)
MONO%: 5.4 % (ref 0.0–14.0)
NEUT#: 3.6 10*3/uL (ref 1.5–6.5)
NEUT%: 66.6 % (ref 38.4–76.8)
PLATELETS: 164 10*3/uL (ref 145–400)
RBC: 4.3 10*6/uL (ref 3.70–5.45)
RDW: 13.8 % (ref 11.2–14.5)
RETIC %: 0.93 % (ref 0.70–2.10)
Retic Ct Abs: 39.99 10*3/uL (ref 33.70–90.70)
WBC: 5.4 10*3/uL (ref 3.9–10.3)
lymph#: 1.3 10*3/uL (ref 0.9–3.3)

## 2016-03-10 MED ORDER — SODIUM CHLORIDE 0.9 % IJ SOLN
10.0000 mL | INTRAMUSCULAR | Status: DC | PRN
  Administered 2016-03-10: 10 mL
  Filled 2016-03-10: qty 10

## 2016-03-10 MED ORDER — FAMOTIDINE IN NACL 20-0.9 MG/50ML-% IV SOLN
20.0000 mg | Freq: Once | INTRAVENOUS | Status: AC
  Administered 2016-03-10: 20 mg via INTRAVENOUS

## 2016-03-10 MED ORDER — SODIUM CHLORIDE 0.9% FLUSH
10.0000 mL | INTRAVENOUS | Status: DC | PRN
  Administered 2016-03-10: 10 mL via INTRAVENOUS
  Filled 2016-03-10: qty 10

## 2016-03-10 MED ORDER — DIPHENHYDRAMINE HCL 50 MG/ML IJ SOLN
INTRAMUSCULAR | Status: AC
  Filled 2016-03-10: qty 1

## 2016-03-10 MED ORDER — ZOLPIDEM TARTRATE 10 MG PO TABS: 10.0000 mg | ORAL_TABLET | Freq: Every evening | ORAL | Status: DC | PRN

## 2016-03-10 MED ORDER — SODIUM CHLORIDE 0.9 % IV SOLN
240.0000 mg | Freq: Once | INTRAVENOUS | Status: AC
  Administered 2016-03-10: 240 mg via INTRAVENOUS
  Filled 2016-03-10: qty 8

## 2016-03-10 MED ORDER — HEPARIN SOD (PORK) LOCK FLUSH 100 UNIT/ML IV SOLN
500.0000 [IU] | Freq: Once | INTRAVENOUS | Status: AC | PRN
  Administered 2016-03-10: 500 [IU]
  Filled 2016-03-10: qty 5

## 2016-03-10 MED ORDER — SODIUM CHLORIDE 0.9 % IV SOLN
Freq: Once | INTRAVENOUS | Status: AC
  Administered 2016-03-10: 14:00:00 via INTRAVENOUS

## 2016-03-10 MED ORDER — FAMOTIDINE IN NACL 20-0.9 MG/50ML-% IV SOLN
INTRAVENOUS | Status: AC
  Filled 2016-03-10: qty 50

## 2016-03-10 MED ORDER — DIPHENHYDRAMINE HCL 50 MG/ML IJ SOLN
25.0000 mg | Freq: Once | INTRAMUSCULAR | Status: AC
  Administered 2016-03-10: 25 mg via INTRAVENOUS

## 2016-03-10 NOTE — Telephone Encounter (Signed)
per pof to sch pt appt-sent MW emailt o sch trmt-pt to get updated copy b4 leaving

## 2016-03-10 NOTE — Patient Instructions (Signed)
[facility] Discharge Instructions for Patients Receiving Chemotherapy  Today you received the following chemotherapy agents:  Nivolumab.  To help prevent nausea and vomiting after your treatment, we encourage you to take your nausea medication as directed.   If you develop nausea and vomiting that is not controlled by your nausea medication, call the clinic.   BELOW ARE SYMPTOMS THAT SHOULD BE REPORTED IMMEDIATELY:  *FEVER GREATER THAN 100.5 F  *CHILLS WITH OR WITHOUT FEVER  NAUSEA AND VOMITING THAT IS NOT CONTROLLED WITH YOUR NAUSEA MEDICATION  *UNUSUAL SHORTNESS OF BREATH  *UNUSUAL BRUISING OR BLEEDING  TENDERNESS IN MOUTH AND THROAT WITH OR WITHOUT PRESENCE OF ULCERS  *URINARY PROBLEMS  *BOWEL PROBLEMS  UNUSUAL RASH Items with * indicate a potential emergency and should be followed up as soon as possible.  Feel free to call the clinic you have any questions or concerns. The clinic phone number is [phone].  Please show the CHEMO ALERT CARD at check-in to the Emergency Department and triage nurse.   

## 2016-03-10 NOTE — Progress Notes (Signed)
[name]    HEMATOLOGY ONCOLOGY PROGRESS NOTE  Date of service:  03/10/2016   Patient Care Team: Cindy Byrd, Byrd as PCP - General (Family Medicine) Cindy Byrd, Byrd as Consulting Physician (Hematology and Oncology)  CHIEF COMPLAINTS/PURPOSE OF CONSULTATION: Follow-up for metastatic lung cancer  DIAGNOSIS:   #1 Metastatic non-small cell lung cancer with bilateral lung nodules and large metastatic lesion in the left Ilium. #2 Barrett's esophagus with some evidence of intramucosal adenocarcinoma of the esophagus. (being managed and followed by Cindy Byrd- Gastroenterology)  Treatment  1 Palliative radiation therapy to the large left ilium metastases 2. IV Nivolumab x 15ycles 3. Xgeva 128m [place] q4weeks for bone metastases.   HISTORY OF PRESENTING ILLNESS: (plz see my previous consultation for details of initial presentation)  INTERVAL HISTORY  Cindy Byrd here for her scheduled follow-up prior to her next cycle of Nivolumab.  She notes that she has completely quit smoking about 2 weeks ago. I commended her on this and encouraged her to continue to maintain her smoking cessation. [facility]'s [facility]/intramucosal adenocarcinoma of the esophagus. No other acute new symptoms. Breathing is stable. No fevers or chills. He has a few skin lesions on her upper extremities which look like chigger bites. Notes that she has issues with insomnia and is okay with trying some Ambien.   MEDICAL HISTORY:  Past Medical History  Diagnosis Date  . IBS (irritable bowel syndrome)   . Bone neoplasm 06/24/2015  . GERD (gastroesophageal reflux disease)   . Vitamin D deficiency disease   . Cigarette smoker two packs a day or less     Currently still smoking 2 PPD - Not interested in quitting at this time.  . Colon polyps     hyperplastic, tubular adenomas, tubulovillous adenoma  . Endometriosis     Hysterectomy with BSO at  age [age]yrs  . H/O: pneumonia   . Heavy smoker (more than 20 cigarettes per day) 06/24/2015  . Depression 06/24/2015  . Hypertension 06/24/2015    likely improved incidental to 40 lbs weight loss from her neoplasm. No Longer taking med for this as of 08-06-15  . Cancer ([facility]     metastatic poorly differentiated carcinoma. tumor left groin surgical removal with radiation tx.  . Cough, persistent     hx. lung cancer ? primary-being evaluated, unsure of primary site.  . Pain     left hip-persistent"tumor of bone"-radiation tx. 10.  . Swelling of ankle     bilateral  . Pulmonary nodules   . Barrett's esophagus   . Hiatal hernia   . Gastritis   . Esophageal adenocarcinoma ([facility] 08/11/15    intramucosal  . Diverticulosis   . Cataract     BILATERAL  . Emphysema of lung ([facility]   . Hyperlipidemia    SURGICAL HISTORY: Past Surgical History  Procedure Laterality Date  . Abdominal hysterectomy    . Tonsillectomy    . Ganglion cyst excision    . Total abdominal hysterectomy w/ bilateral salpingoophorectomy  at age [age]yrs    For endometriosis  . Knee arthroscopy  age about 89yrs  . Bartholin gland cyst excision  75yo ago    Does not want if it was an infected cyst or tumor. Was soon as delivery  . Colonoscopy w/ polypectomy      multiple times - last done 09/2014 per patient.  . Esophagogastroduodenoscopy (egd) with propofol  N/A 08/11/2015    Procedure: ESOPHAGOGASTRODUODENOSCOPY (EGD) WITH PROPOFOL;  Surgeon: [name], Byrd;  Location: [facility];  Service: Gastroenterology;  Laterality: N/A;    SOCIAL HISTORY: Social History   Social History  . Marital Status: Widowed    Spouse Name: N/A  . Number of Children: 2  . Years of Education: N/A   Occupational History  . Not on file.   Social History Main Topics  . Smoking status: Current Every Day Smoker -- 1.00 packs/day for 60 years    Types: Cigarettes  . Smokeless tobacco: Never Used  . Alcohol Use: No  . Drug Use: No  .  Sexual Activity: No   Other Topics Concern  . Not on file   Social History Narrative    FAMILY HISTORY: Family History  Problem Relation Age of Onset  . Stroke Mother   . Colon cancer Father   . Colon cancer Brother   . Colon cancer Brother   . Breast cancer Daughter 4    ER/PR+ stage II    ALLERGIES:  is allergic to penicillins; remeron; and latex. patient wonders if she has a penicillin allergy but notes that she is uncertain about this.  MEDICATIONS:  Current Outpatient Prescriptions  Medication Sig Dispense Refill  . amLODipine (NORVASC) 5 MG tablet TAKE 1 TABLET (5 MG TOTAL) BY MOUTH DAILY. 30 tablet 1  . artificial tears (LACRILUBE) OINT ophthalmic ointment Place into both eyes at bedtime. 1 Tube 1  . citalopram (CELEXA) 20 MG tablet Take 1 tablet (20 mg total) by mouth daily. 30 tablet 3  . docusate sodium (COLACE) 100 MG capsule Take 100 mg by mouth daily as needed for mild constipation. Reported on 02/23/2016    . Hypromellose (ARTIFICIAL TEARS) 0.4 % SOLN Apply 1 drop to eye QID. (Patient not taking: Reported on 02/11/2016) 1 Bottle 1  . lidocaine-prilocaine (EMLA) cream Apply small amount over port 1-2 hours prior to treatment, cover with plastic wrap (DO NOT RUB IN). 30 g prn  . NICOTINE STEP 1 21 MG/24HR patch PLACE 1 PATCH (21 MG TOTAL) ONTO THE SKIN DAILY. 28 patch 0  . omeprazole (PRILOSEC) 40 MG capsule TAKE ONE CAPSULE BY MOUTH EVERY DAY *NEEDS OFFICE VISIT FOR FURTHER REFILL* 30 capsule 5  . oxyCODONE-acetaminophen (PERCOCET/ROXICET) 5-325 MG tablet Take 1 tablet by mouth every 4 (four) hours as needed for severe pain. (Patient not taking: Reported on 02/23/2016) 60 tablet 0  . polycarbophil (FIBERCON) 625 MG tablet Take 2 tablets (1,250 mg total) by mouth 2 (two) times daily. 120 tablet 1  . PRESCRIPTION MEDICATION Antibody Plan [facility]    . valsartan (DIOVAN) 80 MG tablet     . zolpidem (AMBIEN) 10 MG tablet Take 1 tablet (10 mg total) by mouth at bedtime as needed  for sleep. 30 tablet 0   No current facility-administered medications for this visit.   Facility-Administered Medications Ordered in Other Visits  Medication Dose Route Frequency Provider Last Rate Last Dose  . 0.9 %  sodium chloride infusion   Intravenous Once Cindy Byrd, Byrd      . diphenhydrAMINE (BENADRYL) injection 25 mg  25 mg Intravenous Once Cindy Byrd, Byrd      . famotidine (PEPCID) IVPB 20 mg premix  20 mg Intravenous Once Cindy Byrd, Byrd      . heparin lock flush 100 unit/mL  500 Units Intracatheter Once PRN Cindy Byrd, Byrd      . nivolumab ([place]) 240  mg in sodium chloride 0.9 % 100 mL chemo infusion  240 mg Intravenous Once Cindy Byrd, Byrd      . sodium chloride 0.9 % injection 10 mL  10 mL Intracatheter PRN Cindy Byrd, Byrd        REVIEW OF SYSTEMS:    10 point review of systems done and was noted to be negative except as noted above.  PHYSICAL EXAMINATION: ECOG PERFORMANCE STATUS: 2 - Symptomatic, <50% confined to bed  Filed Vitals:   03/10/16 1259  BP: 152/56  Pulse: 56  Temp: 97.6 F (36.4 C)  Resp: 18   Filed Weights   03/10/16 1259  Weight: 136 lb 9.6 oz (61.961 kg)  . Wt Readings from Last 3 Encounters:  03/10/16 136 lb 9.6 oz (61.961 kg)  02/23/16 128 lb (58.06 kg)  02/11/16 128 lb (58.06 kg)   GENERAL:alert, NAD, appears well. SKIN: skin color, texture, turgor are normal, no rashes or significant lesions EYES: normal, conjunctiva are pink and non-injected, sclera clear OROPHARYNX:no exudate, no erythema and lips, buccal mucosa, and tongue normal  NECK: supple, thyroid normal size, non-tender, without nodularity LYMPH:  no palpable lymphadenopathy in the cervical, axillary or inguinal LUNGS: clear to auscultation and bilateral reduced breath sounds with normal respiratory effort  HEART: regular rate & rhythm and no murmurs and resolved lower extremity edema ABDOMEN:abdomen soft, non-tender and normal  bowel sounds Musculoskeletal: No pedal edema. No calf pain or tenderness.  PSYCH: alert & oriented x 3 with fluent speech NEURO: no focal motor/sensory deficits.  LABORATORY DATA:  I have reviewed the data as listed . CBC Latest Ref Rng 03/10/2016 02/25/2016 02/11/2016  WBC 3.9 - 10.3 10e3/uL 5.4 6.1 5.0  Hemoglobin 11.6 - 15.9 g/dL 12.6 13.1 12.8  Hematocrit 34.8 - 46.6 % 38.2 39.0 38.8  Platelets 145 - 400 10e3/uL 164 158 162   . CMP Latest Ref Rng 03/10/2016 02/25/2016 02/11/2016  Glucose 70 - 140 mg/dl 90 96 110  BUN 7.0 - 26.0 mg/dL 13.3 15.8 13.8  Creatinine 0.6 - 1.1 mg/dL 0.8 0.8 0.8  Sodium 136 - 145 mEq/L 140 140 138  Potassium 3.5 - 5.1 mEq/L 4.2 4.1 4.0  CO2 22 - 29 mEq/L 24 24 24   Calcium 8.4 - 10.4 mg/dL 8.9 9.2 9.1  Total Protein 6.4 - 8.3 g/dL 7.3 7.3 7.3  Total Bilirubin 0.20 - 1.20 mg/dL <0.30 0.41 0.39  Alkaline Phos 40 - 150 U/L 73 73 71  AST 5 - 34 U/L 21 18 17   ALT 0 - 55 U/L 15 10 <9     RADIOGRAPHIC STUDIES: I have personally reviewed the radiological images as listed and agreed with the findings in the report. No results found.  ASSESSMENT & PLAN:   #1 Metastatic poorly differentiated carcinoma with likely lung primary [non-small cell lung cancer].  CT of the head with and without contrast showed no evidence of metastatic disease. Patient notes much improved pain control. EGFR blood test mutation analysis negative. Patient's pain is much better controlled after radiation for the painful ilium met.  CT chest abdomen pelvis on 08/20/2015 showed improvement in the mediastinal adenopathy, stable lung nodules and some increase in the size of her left ilial lesion.  The increase in the left ilial lesion does not appear to be a true progression since the patient's clinical symptoms have much improved after XRT it might suggest a post CT pre XRT increase in size or pseudo-progression due to tumor infiltrating immune cells.  Patient is status post XRT to her left  ilial lesion for palliative control of her symptoms. She has received 16 cycles of Nivolumab. He has tolerated treatment without any prohibitive toxicities. TSH WNL today.  CT chest abdomen pelvis and bone scan 10/21/2015. shows good response to treatment with no evidence of disease progression.  CT chest abd pelvis 01/14/2016- showed continued positive response with no evidence of disease progression.  Plan -no significant treatment toxicities noted. -No clinical evidence of disease progression at this time. -Labs are stable today and the patient is appropriate to proceed with her next cycle of IV Nivolumab  -plan to continue Nivolumab until disease progression or prohibitive toxicities arise. -continue Xgeva q4weeks  - patient's pain is much improved and she is only rarely using Percocet for intermittent pain from her metastatic left ilium disease. -continue labs q2weeks prior to treatments -Will see the patient back in 6 weeks with a repeat staging CT chest abdomen pelvis  #2 Anxiety and depression related to her life-threatening diagnosis of cancer . Patient did not tolerate Remeron due to nightmares  Patient overall is in a much better place now with decrease anxiety and improved sleep. Has been eating well with improvement in her weight. Has gained 8 pounds since her last visit in the setting of complete smoking cessation. . Wt Readings from Last 3 Encounters:  03/10/16 136 lb 9.6 oz (61.961 kg)  02/23/16 128 lb (58.06 kg)  02/11/16 128 lb (58.06 kg)   plan -Continue on Celexa 20 mg by mouth daily  -Given prescription for Ambien for insomnia.   #3 Barrett's esophagus 4cms in the distal esophagus with low and high-grade dysplasia cannot rule out an early intra-mucosal esophageal adenocarcinoma. Plan -Patient being monitored by Cindy. Hilarie Byrd from GI. -Patient will be seen at [facility] for further management and has an appointment for 03/14/2016   #4 Nicotine addiction -  has cut down her smoking from 1-2 packs of cigarettes per day to about 5-6 cigarettes a day and now has completely stopped smoking the last 2 weeks which is a significant achievement for her. Plan -I recommended her on quitting smoking and encouraged her to continue this. -Continue nicotine patch 21 mg/24h for 2 more weeks then down to 14 mg/24h for 4 weeks and then 7 mg per 24 hours for 2 weeks.  Return to care with Cindy Byrd in 6 weeks with CBC, CMP and CT chest abdomen pelvis . Continue treatment every 2 weeks as per plan .  All questions were answered. The patient knows to call the clinic with any problems, questions or concerns.  I spent 15 minutes counseling the patient face to face including discussion of her imaging results, treatment plan and smoking cessation .The total time spent in the appointment was 15 minutes and more than 50% was on counseling.  Cindy Byrd Pettis Hematology/Oncology Physician [facility]  (Office): [phone] (Work cell): [phone] (Fax): [phone]

## 2016-03-10 NOTE — Patient Instructions (Signed)

## 2016-03-10 NOTE — Telephone Encounter (Signed)
Per staff message and POF I have scheduled appts. Advised scheduler of appts. JMW  

## 2016-03-14 ENCOUNTER — Other Ambulatory Visit: Payer: Self-pay | Admitting: Oncology

## 2016-03-22 ENCOUNTER — Telehealth: Payer: Self-pay | Admitting: *Deleted

## 2016-03-22 NOTE — Telephone Encounter (Signed)
"  The CT Abd/Pelvis/Chest has been scheduled for Apr 19, 2016.  Labs need to be checked before the CT scans at 8:30 or 9:00 am instead of Apr 21, 2016."  P.O.F. Generated to notify schedulers.

## 2016-03-24 ENCOUNTER — Other Ambulatory Visit (HOSPITAL_BASED_OUTPATIENT_CLINIC_OR_DEPARTMENT_OTHER): Payer: Medicare Other

## 2016-03-24 ENCOUNTER — Ambulatory Visit (HOSPITAL_BASED_OUTPATIENT_CLINIC_OR_DEPARTMENT_OTHER): Payer: Medicare Other

## 2016-03-24 ENCOUNTER — Ambulatory Visit: Payer: Medicare Other

## 2016-03-24 ENCOUNTER — Other Ambulatory Visit: Payer: Self-pay | Admitting: *Deleted

## 2016-03-24 DIAGNOSIS — C3491 Malignant neoplasm of unspecified part of right bronchus or lung: Secondary | ICD-10-CM

## 2016-03-24 DIAGNOSIS — C3490 Malignant neoplasm of unspecified part of unspecified bronchus or lung: Secondary | ICD-10-CM

## 2016-03-24 DIAGNOSIS — C7951 Secondary malignant neoplasm of bone: Secondary | ICD-10-CM

## 2016-03-24 DIAGNOSIS — C799 Secondary malignant neoplasm of unspecified site: Secondary | ICD-10-CM

## 2016-03-24 DIAGNOSIS — Z95828 Presence of other vascular implants and grafts: Secondary | ICD-10-CM

## 2016-03-24 DIAGNOSIS — Z5112 Encounter for antineoplastic immunotherapy: Secondary | ICD-10-CM

## 2016-03-24 LAB — COMPREHENSIVE METABOLIC PANEL
ANION GAP: 8 meq/L (ref 3–11)
AST: 19 U/L (ref 5–34)
Albumin: 3.4 g/dL — ABNORMAL LOW (ref 3.5–5.0)
Alkaline Phosphatase: 63 U/L (ref 40–150)
BUN: 17.8 mg/dL (ref 7.0–26.0)
CHLORIDE: 109 meq/L (ref 98–109)
CO2: 24 meq/L (ref 22–29)
Calcium: 9 mg/dL (ref 8.4–10.4)
Creatinine: 0.9 mg/dL (ref 0.6–1.1)
EGFR: 60 mL/min/{1.73_m2} — AB (ref >90)
Glucose: 102 mg/dl (ref 70–140)
POTASSIUM: 4 meq/L (ref 3.5–5.1)
Sodium: 141 mEq/L (ref 136–145)
Total Bilirubin: 0.36 mg/dL (ref 0.20–1.20)
Total Protein: 7.3 g/dL (ref 6.4–8.3)

## 2016-03-24 LAB — CBC & DIFF AND RETIC
BASO%: 1 % (ref 0.0–2.0)
Basophils Absolute: 0.1 10*3/uL (ref 0.0–0.1)
EOS%: 3.2 % (ref 0.0–7.0)
Eosinophils Absolute: 0.2 10*3/uL (ref 0.0–0.5)
HCT: 37.8 % (ref 34.8–46.6)
HGB: 12.6 g/dL (ref 11.6–15.9)
Immature Retic Fract: 1.1 % — ABNORMAL LOW (ref 1.60–10.00)
LYMPH#: 1 10*3/uL (ref 0.9–3.3)
LYMPH%: 19 % (ref 14.0–49.7)
MCH: 29.5 pg (ref 25.1–34.0)
MCHC: 33.3 g/dL (ref 31.5–36.0)
MCV: 88.5 fL (ref 79.5–101.0)
MONO#: 0.4 10*3/uL (ref 0.1–0.9)
MONO%: 7 % (ref 0.0–14.0)
NEUT#: 3.7 10*3/uL (ref 1.5–6.5)
NEUT%: 69.8 % (ref 38.4–76.8)
PLATELETS: 157 10*3/uL (ref 145–400)
RBC: 4.27 10*6/uL (ref 3.70–5.45)
RDW: 13.6 % (ref 11.2–14.5)
RETIC CT ABS: 41.85 10*3/uL (ref 33.70–90.70)
Retic %: 0.98 % (ref 0.70–2.10)
WBC: 5.3 10*3/uL (ref 3.9–10.3)

## 2016-03-24 MED ORDER — SODIUM CHLORIDE 0.9 % IV SOLN
Freq: Once | INTRAVENOUS | Status: AC
  Administered 2016-03-24: 14:00:00 via INTRAVENOUS

## 2016-03-24 MED ORDER — SODIUM CHLORIDE 0.9% FLUSH
10.0000 mL | INTRAVENOUS | Status: DC | PRN
  Administered 2016-03-24: 10 mL via INTRAVENOUS
  Filled 2016-03-24: qty 10

## 2016-03-24 MED ORDER — DIPHENHYDRAMINE HCL 50 MG/ML IJ SOLN
INTRAMUSCULAR | Status: AC
  Filled 2016-03-24: qty 1

## 2016-03-24 MED ORDER — HEPARIN SOD (PORK) LOCK FLUSH 100 UNIT/ML IV SOLN
500.0000 [IU] | Freq: Once | INTRAVENOUS | Status: AC | PRN
  Administered 2016-03-24: 500 [IU]
  Filled 2016-03-24: qty 5

## 2016-03-24 MED ORDER — SODIUM CHLORIDE 0.9 % IJ SOLN
10.0000 mL | INTRAMUSCULAR | Status: DC | PRN
  Administered 2016-03-24: 10 mL
  Filled 2016-03-24: qty 10

## 2016-03-24 MED ORDER — FAMOTIDINE IN NACL 20-0.9 MG/50ML-% IV SOLN
20.0000 mg | Freq: Once | INTRAVENOUS | Status: AC
  Administered 2016-03-24: 20 mg via INTRAVENOUS

## 2016-03-24 MED ORDER — DENOSUMAB 120 MG/1.7ML ~~LOC~~ SOLN
120.0000 mg | Freq: Once | SUBCUTANEOUS | Status: AC
  Administered 2016-03-24: 120 mg via SUBCUTANEOUS
  Filled 2016-03-24: qty 1.7

## 2016-03-24 MED ORDER — DIPHENHYDRAMINE HCL 50 MG/ML IJ SOLN
25.0000 mg | Freq: Once | INTRAMUSCULAR | Status: AC
  Administered 2016-03-24: 25 mg via INTRAVENOUS

## 2016-03-24 MED ORDER — FAMOTIDINE IN NACL 20-0.9 MG/50ML-% IV SOLN
INTRAVENOUS | Status: AC
  Filled 2016-03-24: qty 50

## 2016-03-24 MED ORDER — SODIUM CHLORIDE 0.9 % IV SOLN
240.0000 mg | Freq: Once | INTRAVENOUS | Status: AC
  Administered 2016-03-24: 240 mg via INTRAVENOUS
  Filled 2016-03-24: qty 20

## 2016-03-24 NOTE — Patient Instructions (Signed)

## 2016-03-24 NOTE — Patient Instructions (Signed)
[facility] Discharge Instructions for Patients Receiving Chemotherapy  Today you received the following chemotherapy agents:  Nivolumab.  To help prevent nausea and vomiting after your treatment, we encourage you to take your nausea medication as directed.   If you develop nausea and vomiting that is not controlled by your nausea medication, call the clinic.   BELOW ARE SYMPTOMS THAT SHOULD BE REPORTED IMMEDIATELY:  *FEVER GREATER THAN 100.5 F  *CHILLS WITH OR WITHOUT FEVER  NAUSEA AND VOMITING THAT IS NOT CONTROLLED WITH YOUR NAUSEA MEDICATION  *UNUSUAL SHORTNESS OF BREATH  *UNUSUAL BRUISING OR BLEEDING  TENDERNESS IN MOUTH AND THROAT WITH OR WITHOUT PRESENCE OF ULCERS  *URINARY PROBLEMS  *BOWEL PROBLEMS  UNUSUAL RASH Items with * indicate a potential emergency and should be followed up as soon as possible.  Feel free to call the clinic you have any questions or concerns. The clinic phone number is [phone].  Please show the CHEMO ALERT CARD at check-in to the Emergency Department and triage nurse.   

## 2016-04-06 ENCOUNTER — Other Ambulatory Visit: Payer: Self-pay

## 2016-04-06 DIAGNOSIS — C799 Secondary malignant neoplasm of unspecified site: Secondary | ICD-10-CM

## 2016-04-07 ENCOUNTER — Ambulatory Visit: Payer: Medicare Other

## 2016-04-07 ENCOUNTER — Other Ambulatory Visit: Payer: Self-pay | Admitting: *Deleted

## 2016-04-07 ENCOUNTER — Other Ambulatory Visit (HOSPITAL_BASED_OUTPATIENT_CLINIC_OR_DEPARTMENT_OTHER): Payer: Medicare Other

## 2016-04-07 ENCOUNTER — Ambulatory Visit (HOSPITAL_BASED_OUTPATIENT_CLINIC_OR_DEPARTMENT_OTHER): Payer: Medicare Other

## 2016-04-07 DIAGNOSIS — Z79899 Other long term (current) drug therapy: Secondary | ICD-10-CM

## 2016-04-07 DIAGNOSIS — C3490 Malignant neoplasm of unspecified part of unspecified bronchus or lung: Secondary | ICD-10-CM

## 2016-04-07 DIAGNOSIS — C799 Secondary malignant neoplasm of unspecified site: Secondary | ICD-10-CM

## 2016-04-07 DIAGNOSIS — Z5112 Encounter for antineoplastic immunotherapy: Secondary | ICD-10-CM | POA: Diagnosis not present

## 2016-04-07 DIAGNOSIS — Z452 Encounter for adjustment and management of vascular access device: Secondary | ICD-10-CM

## 2016-04-07 DIAGNOSIS — Z95828 Presence of other vascular implants and grafts: Secondary | ICD-10-CM

## 2016-04-07 LAB — COMPREHENSIVE METABOLIC PANEL
ALBUMIN: 3.6 g/dL (ref 3.5–5.0)
ALK PHOS: 67 U/L (ref 40–150)
ALT: 10 U/L (ref 0–55)
AST: 19 U/L (ref 5–34)
Anion Gap: 7 mEq/L (ref 3–11)
BILIRUBIN TOTAL: 0.36 mg/dL (ref 0.20–1.20)
BUN: 16.5 mg/dL (ref 7.0–26.0)
CO2: 24 mEq/L (ref 22–29)
CREATININE: 0.8 mg/dL (ref 0.6–1.1)
Calcium: 8.9 mg/dL (ref 8.4–10.4)
Chloride: 108 mEq/L (ref 98–109)
EGFR: 67 mL/min/{1.73_m2} — AB (ref >90)
GLUCOSE: 79 mg/dL (ref 70–140)
Potassium: 4 mEq/L (ref 3.5–5.1)
SODIUM: 140 meq/L (ref 136–145)
TOTAL PROTEIN: 7.4 g/dL (ref 6.4–8.3)

## 2016-04-07 LAB — CBC & DIFF AND RETIC
BASO%: 1.8 % (ref 0.0–2.0)
BASOS ABS: 0.1 10*3/uL (ref 0.0–0.1)
EOS ABS: 0.2 10*3/uL (ref 0.0–0.5)
EOS%: 4.1 % (ref 0.0–7.0)
HCT: 37.3 % (ref 34.8–46.6)
HEMOGLOBIN: 12.4 g/dL (ref 11.6–15.9)
IMMATURE RETIC FRACT: 3 % (ref 1.60–10.00)
LYMPH#: 1 10*3/uL (ref 0.9–3.3)
LYMPH%: 20.4 % (ref 14.0–49.7)
MCH: 29.3 pg (ref 25.1–34.0)
MCHC: 33.2 g/dL (ref 31.5–36.0)
MCV: 88.2 fL (ref 79.5–101.0)
MONO#: 0.5 10*3/uL (ref 0.1–0.9)
MONO%: 10.4 % (ref 0.0–14.0)
NEUT#: 3.2 10*3/uL (ref 1.5–6.5)
NEUT%: 63.3 % (ref 38.4–76.8)
NRBC: 0 % (ref 0–0)
PLATELETS: 172 10*3/uL (ref 145–400)
RBC: 4.23 10*6/uL (ref 3.70–5.45)
RDW: 13.6 % (ref 11.2–14.5)
Retic %: 1.22 % (ref 0.70–2.10)
Retic Ct Abs: 51.61 10*3/uL (ref 33.70–90.70)
WBC: 5.1 10*3/uL (ref 3.9–10.3)

## 2016-04-07 LAB — TSH: TSH: 2.202 m[IU]/L (ref 0.308–3.960)

## 2016-04-07 MED ORDER — HEPARIN SOD (PORK) LOCK FLUSH 100 UNIT/ML IV SOLN
500.0000 [IU] | Freq: Once | INTRAVENOUS | Status: AC | PRN
  Administered 2016-04-07: 500 [IU]
  Filled 2016-04-07: qty 5

## 2016-04-07 MED ORDER — SODIUM CHLORIDE 0.9 % IV SOLN
240.0000 mg | Freq: Once | INTRAVENOUS | Status: AC
  Administered 2016-04-07: 240 mg via INTRAVENOUS
  Filled 2016-04-07: qty 20

## 2016-04-07 MED ORDER — SODIUM CHLORIDE 0.9 % IJ SOLN
10.0000 mL | INTRAMUSCULAR | Status: AC | PRN
  Administered 2016-04-07: 10 mL
  Filled 2016-04-07: qty 10

## 2016-04-07 MED ORDER — SODIUM CHLORIDE 0.9 % IV SOLN
Freq: Once | INTRAVENOUS | Status: AC
  Administered 2016-04-07: 15:00:00 via INTRAVENOUS

## 2016-04-07 MED ORDER — SODIUM CHLORIDE 0.9 % IJ SOLN
10.0000 mL | INTRAMUSCULAR | Status: DC | PRN
  Administered 2016-04-07: 10 mL
  Filled 2016-04-07: qty 10

## 2016-04-07 MED ORDER — ALTEPLASE 2 MG IJ SOLR
2.0000 mg | Freq: Once | INTRAMUSCULAR | Status: AC | PRN
  Administered 2016-04-07: 2 mg
  Filled 2016-04-07: qty 2

## 2016-04-07 NOTE — Patient Instructions (Signed)

## 2016-04-07 NOTE — Progress Notes (Signed)
1500 no blood return from Altepase placed at 1430.  It remains indwelling.

## 2016-04-07 NOTE — Progress Notes (Signed)
1605 Blood return noted. Port flushed and deaccessed.

## 2016-04-07 NOTE — Progress Notes (Signed)
DOES NOT NEED BENADRYL OR PEPCID AS A PREMEDICATION ANY LONGER PER DR. KALE - PT HAD REQUESTED TO NOT HAVE IT UNLESS SHE HAD TO AND HE AGREED SHE DOES NOT NEED IT AS A PRE MEDICATION FOR OPDIVO.

## 2016-04-07 NOTE — Patient Instructions (Signed)
[facility] Discharge Instructions for Patients Receiving Chemotherapy  Today you received the following chemotherapy agents Opdivo.  To help prevent nausea and vomiting after your treatment, we encourage you to take your nausea medication as directed.   If you develop nausea and vomiting that is not controlled by your nausea medication, call the clinic.   BELOW ARE SYMPTOMS THAT SHOULD BE REPORTED IMMEDIATELY:  *FEVER GREATER THAN 100.5 F  *CHILLS WITH OR WITHOUT FEVER  NAUSEA AND VOMITING THAT IS NOT CONTROLLED WITH YOUR NAUSEA MEDICATION  *UNUSUAL SHORTNESS OF BREATH  *UNUSUAL BRUISING OR BLEEDING  TENDERNESS IN MOUTH AND THROAT WITH OR WITHOUT PRESENCE OF ULCERS  *URINARY PROBLEMS  *BOWEL PROBLEMS  UNUSUAL RASH Items with * indicate a potential emergency and should be followed up as soon as possible.  Feel free to call the clinic you have any questions or concerns. The clinic phone number is [phone].  Please show the CHEMO ALERT CARD at check-in to the Emergency Department and triage nurse.    

## 2016-04-07 NOTE — Progress Notes (Signed)
No blood return now, TPA has 30 more minutes to indwell.

## 2016-04-07 NOTE — Progress Notes (Signed)
NO blood return upon checking at 1530, Altepase remains indwelling.

## 2016-04-18 ENCOUNTER — Other Ambulatory Visit: Payer: Self-pay | Admitting: *Deleted

## 2016-04-18 DIAGNOSIS — C7951 Secondary malignant neoplasm of bone: Secondary | ICD-10-CM

## 2016-04-18 DIAGNOSIS — C3490 Malignant neoplasm of unspecified part of unspecified bronchus or lung: Secondary | ICD-10-CM

## 2016-04-19 ENCOUNTER — Encounter (HOSPITAL_COMMUNITY): Payer: Self-pay

## 2016-04-19 ENCOUNTER — Ambulatory Visit (HOSPITAL_COMMUNITY)
Admission: RE | Admit: 2016-04-19 | Discharge: 2016-04-19 | Disposition: A | Discharge status: Home / Self Care | Payer: Medicare Other | Source: Ambulatory Visit | Attending: Hematology | Admitting: Hematology

## 2016-04-19 ENCOUNTER — Ambulatory Visit (HOSPITAL_BASED_OUTPATIENT_CLINIC_OR_DEPARTMENT_OTHER): Payer: Medicare Other

## 2016-04-19 ENCOUNTER — Other Ambulatory Visit (HOSPITAL_BASED_OUTPATIENT_CLINIC_OR_DEPARTMENT_OTHER): Payer: Medicare Other

## 2016-04-19 VITALS — BP 158/61 | HR 61 | Temp 97.8°F | Resp 16

## 2016-04-19 DIAGNOSIS — C3490 Malignant neoplasm of unspecified part of unspecified bronchus or lung: Secondary | ICD-10-CM | POA: Diagnosis present

## 2016-04-19 DIAGNOSIS — R938 Abnormal findings on diagnostic imaging of other specified body structures: Secondary | ICD-10-CM | POA: Insufficient documentation

## 2016-04-19 DIAGNOSIS — C7951 Secondary malignant neoplasm of bone: Secondary | ICD-10-CM | POA: Insufficient documentation

## 2016-04-19 DIAGNOSIS — K5730 Diverticulosis of large intestine without perforation or abscess without bleeding: Secondary | ICD-10-CM | POA: Diagnosis not present

## 2016-04-19 DIAGNOSIS — J439 Emphysema, unspecified: Secondary | ICD-10-CM | POA: Diagnosis not present

## 2016-04-19 DIAGNOSIS — R918 Other nonspecific abnormal finding of lung field: Secondary | ICD-10-CM | POA: Insufficient documentation

## 2016-04-19 LAB — CBC & DIFF AND RETIC
BASO%: 0.7 % (ref 0.0–2.0)
Basophils Absolute: 0 10*3/uL (ref 0.0–0.1)
EOS%: 3.4 % (ref 0.0–7.0)
Eosinophils Absolute: 0.2 10*3/uL (ref 0.0–0.5)
HCT: 35.5 % (ref 34.8–46.6)
HGB: 11.8 g/dL (ref 11.6–15.9)
IMMATURE RETIC FRACT: 3.9 % (ref 1.60–10.00)
LYMPH#: 0.8 10*3/uL — AB (ref 0.9–3.3)
LYMPH%: 13.6 % — ABNORMAL LOW (ref 14.0–49.7)
MCH: 29 pg (ref 25.1–34.0)
MCHC: 33.2 g/dL (ref 31.5–36.0)
MCV: 87.2 fL (ref 79.5–101.0)
MONO#: 0.5 10*3/uL (ref 0.1–0.9)
MONO%: 8.7 % (ref 0.0–14.0)
NEUT%: 73.6 % (ref 38.4–76.8)
NEUTROS ABS: 4.5 10*3/uL (ref 1.5–6.5)
Platelets: 164 10*3/uL (ref 145–400)
RBC: 4.07 10*6/uL (ref 3.70–5.45)
RDW: 13.6 % (ref 11.2–14.5)
RETIC CT ABS: 39.48 10*3/uL (ref 33.70–90.70)
Retic %: 0.97 % (ref 0.70–2.10)
WBC: 6.1 10*3/uL (ref 3.9–10.3)

## 2016-04-19 LAB — COMPREHENSIVE METABOLIC PANEL
ALT: 9 U/L (ref 0–55)
ANION GAP: 6 meq/L (ref 3–11)
AST: 17 U/L (ref 5–34)
Albumin: 3.4 g/dL — ABNORMAL LOW (ref 3.5–5.0)
Alkaline Phosphatase: 74 U/L (ref 40–150)
BILIRUBIN TOTAL: 0.33 mg/dL (ref 0.20–1.20)
BUN: 13.5 mg/dL (ref 7.0–26.0)
CHLORIDE: 111 meq/L — AB (ref 98–109)
CO2: 23 meq/L (ref 22–29)
CREATININE: 0.8 mg/dL (ref 0.6–1.1)
Calcium: 8.7 mg/dL (ref 8.4–10.4)
EGFR: 69 mL/min/{1.73_m2} — AB (ref >90)
GLUCOSE: 93 mg/dL (ref 70–140)
Potassium: 3.8 mEq/L (ref 3.5–5.1)
SODIUM: 140 meq/L (ref 136–145)
TOTAL PROTEIN: 7.4 g/dL (ref 6.4–8.3)

## 2016-04-19 MED ORDER — IOPAMIDOL (ISOVUE-300) INJECTION 61%
100.0000 mL | Freq: Once | INTRAVENOUS | Status: AC | PRN
  Administered 2016-04-19: 100 mL via INTRAVENOUS

## 2016-04-19 MED ORDER — SODIUM CHLORIDE 0.9 % IJ SOLN
10.0000 mL | Freq: Once | INTRAMUSCULAR | Status: AC
  Administered 2016-04-19: 10 mL
  Filled 2016-04-19: qty 10

## 2016-04-21 ENCOUNTER — Telehealth: Payer: Self-pay | Admitting: Hematology

## 2016-04-21 ENCOUNTER — Ambulatory Visit (HOSPITAL_BASED_OUTPATIENT_CLINIC_OR_DEPARTMENT_OTHER): Payer: Medicare Other | Admitting: Hematology

## 2016-04-21 ENCOUNTER — Other Ambulatory Visit: Payer: Medicare Other

## 2016-04-21 ENCOUNTER — Encounter: Payer: Self-pay | Admitting: Hematology

## 2016-04-21 ENCOUNTER — Telehealth: Payer: Self-pay | Admitting: *Deleted

## 2016-04-21 ENCOUNTER — Ambulatory Visit (HOSPITAL_BASED_OUTPATIENT_CLINIC_OR_DEPARTMENT_OTHER): Payer: Medicare Other

## 2016-04-21 VITALS — BP 140/62 | HR 64 | Temp 97.5°F | Resp 18 | Ht 67.0 in | Wt 145.0 lb

## 2016-04-21 DIAGNOSIS — G893 Neoplasm related pain (acute) (chronic): Secondary | ICD-10-CM | POA: Diagnosis not present

## 2016-04-21 DIAGNOSIS — C3490 Malignant neoplasm of unspecified part of unspecified bronchus or lung: Secondary | ICD-10-CM

## 2016-04-21 DIAGNOSIS — Z5112 Encounter for antineoplastic immunotherapy: Secondary | ICD-10-CM | POA: Diagnosis not present

## 2016-04-21 DIAGNOSIS — C3491 Malignant neoplasm of unspecified part of right bronchus or lung: Secondary | ICD-10-CM

## 2016-04-21 DIAGNOSIS — C7951 Secondary malignant neoplasm of bone: Secondary | ICD-10-CM | POA: Diagnosis not present

## 2016-04-21 MED ORDER — DENOSUMAB 120 MG/1.7ML ~~LOC~~ SOLN
120.0000 mg | Freq: Once | SUBCUTANEOUS | Status: AC
  Administered 2016-04-21: 120 mg via SUBCUTANEOUS
  Filled 2016-04-21: qty 1.7

## 2016-04-21 MED ORDER — SODIUM CHLORIDE 0.9 % IJ SOLN
10.0000 mL | INTRAMUSCULAR | Status: DC | PRN
  Administered 2016-04-21: 10 mL
  Filled 2016-04-21: qty 10

## 2016-04-21 MED ORDER — SODIUM CHLORIDE 0.9 % IV SOLN
Freq: Once | INTRAVENOUS | Status: AC
  Administered 2016-04-21: 14:00:00 via INTRAVENOUS

## 2016-04-21 MED ORDER — CITALOPRAM HYDROBROMIDE 20 MG PO TABS: 20.0000 mg | ORAL_TABLET | Freq: Every day | ORAL | Status: DC

## 2016-04-21 MED ORDER — HEPARIN SOD (PORK) LOCK FLUSH 100 UNIT/ML IV SOLN
500.0000 [IU] | Freq: Once | INTRAVENOUS | Status: AC | PRN
  Administered 2016-04-21: 500 [IU]
  Filled 2016-04-21: qty 5

## 2016-04-21 MED ORDER — SODIUM CHLORIDE 0.9 % IV SOLN
240.0000 mg | Freq: Once | INTRAVENOUS | Status: AC
  Administered 2016-04-21: 240 mg via INTRAVENOUS
  Filled 2016-04-21: qty 8

## 2016-04-21 MED ORDER — OXYCODONE-ACETAMINOPHEN 5-325 MG PO TABS: 1.0000 | ORAL_TABLET | ORAL | Status: DC | PRN

## 2016-04-21 MED ORDER — ZOLPIDEM TARTRATE ER 12.5 MG PO TBCR: 12.5000 mg | EXTENDED_RELEASE_TABLET | Freq: Every evening | ORAL | Status: DC | PRN

## 2016-04-21 NOTE — Patient Instructions (Addendum)
[facility] Discharge Instructions for Patients Receiving Chemotherapy  Today you received the following chemotherapy agent:  Opdivo.  To help prevent nausea and vomiting after your treatment, we encourage you to take your nausea medication as directed.   If you develop nausea and vomiting that is not controlled by your nausea medication, call the clinic.   BELOW ARE SYMPTOMS THAT SHOULD BE REPORTED IMMEDIATELY:  *FEVER GREATER THAN 100.5 F  *CHILLS WITH OR WITHOUT FEVER  NAUSEA AND VOMITING THAT IS NOT CONTROLLED WITH YOUR NAUSEA MEDICATION  *UNUSUAL SHORTNESS OF BREATH  *UNUSUAL BRUISING OR BLEEDING  TENDERNESS IN MOUTH AND THROAT WITH OR WITHOUT PRESENCE OF ULCERS  *URINARY PROBLEMS  *BOWEL PROBLEMS  UNUSUAL RASH Items with * indicate a potential emergency and should be followed up as soon as possible.  Feel free to call the clinic you have any questions or concerns. The clinic phone number is [phone].  Please show the [place] at check-in to the Emergency Department and triage nurse.  Denosumab (XGEVA) injection What is this medicine? DENOSUMAB (den oh sue mab) slows bone breakdown. Prolia is used to treat osteoporosis in women after menopause and in men. Delton See is used to prevent bone fractures and other bone problems caused by cancer bone metastases. Delton See is also used to treat giant cell tumor of the bone. This medicine may be used for other purposes; ask your health care provider or pharmacist if you have questions. What should I tell my health care provider before I take this medicine? They need to know if you have any of these conditions: -dental disease -eczema -infection or history of infections -kidney disease or on dialysis -low blood calcium or vitamin D -malabsorption syndrome -scheduled to have surgery or tooth extraction -taking medicine that contains denosumab -thyroid or parathyroid disease -an unusual reaction to  denosumab, other medicines, foods, dyes, or preservatives -pregnant or trying to get pregnant -breast-feeding How should I use this medicine? This medicine is for injection under the skin. It is given by a health care professional in a hospital or clinic setting. If you are getting Prolia, a special MedGuide will be given to you by the pharmacist with each prescription and refill. Be sure to read this information carefully each time. For Prolia, talk to your pediatrician regarding the use of this medicine in children. Special care may be needed. For Delton See, talk to your pediatrician regarding the use of this medicine in children. While this drug may be prescribed for children as young as 13 years for selected conditions, precautions do apply. Overdosage: If you think you have taken too much of this medicine contact a poison control center or emergency room at once. NOTE: This medicine is only for you. Do not share this medicine with others. What if I miss a dose? It is important not to miss your dose. Call your doctor or health care professional if you are unable to keep an appointment. What may interact with this medicine? Do not take this medicine with any of the following medications: -other medicines containing denosumab This medicine may also interact with the following medications: -medicines that suppress the immune system -medicines that treat cancer -steroid medicines like prednisone or cortisone This list may not describe all possible interactions. Give your health care provider a list of all the medicines, herbs, non-prescription drugs, or dietary supplements you use. Also tell them if you smoke, drink alcohol, or use illegal drugs. Some items may interact with your medicine. What  should I watch for while using this medicine? Visit your doctor or health care professional for regular checks on your progress. Your doctor or health care professional may order blood tests and other tests to  see how you are doing. Call your doctor or health care professional if you get a cold or other infection while receiving this medicine. Do not treat yourself. This medicine may decrease your body's ability to fight infection. You should make sure you get enough calcium and vitamin D while you are taking this medicine, unless your doctor tells you not to. Discuss the foods you eat and the vitamins you take with your health care professional. See your dentist regularly. Brush and floss your teeth as directed. Before you have any dental work done, tell your dentist you are receiving this medicine. Do not become pregnant while taking this medicine or for 5 months after stopping it. Women should inform their doctor if they wish to become pregnant or think they might be pregnant. There is a potential for serious side effects to an unborn child. Talk to your health care professional or pharmacist for more information. What side effects may I notice from receiving this medicine? Side effects that you should report to your doctor or health care professional as soon as possible: -allergic reactions like skin rash, itching or hives, swelling of the face, lips, or tongue -breathing problems -chest pain -fast, irregular heartbeat -feeling faint or lightheaded, falls -fever, chills, or any other sign of infection -muscle spasms, tightening, or twitches -numbness or tingling -skin blisters or bumps, or is dry, peels, or red -slow healing or unexplained pain in the mouth or jaw -unusual bleeding or bruising Side effects that usually do not require medical attention (Report these to your doctor or health care professional if they continue or are bothersome.): -muscle pain -stomach upset, gas This list may not describe all possible side effects. Call your doctor for medical advice about side effects. You may report side effects to FDA at 1-800-FDA-1088. Where should I keep my medicine? This medicine is only  given in a clinic, doctor's office, or other health care setting and will not be stored at home. NOTE: This sheet is a summary. It may not cover all possible information. If you have questions about this medicine, talk to your doctor, pharmacist, or health care provider.    2016, [employer]vier/Gold Standard. (2012-05-21 12:37:47)

## 2016-04-21 NOTE — Telephone Encounter (Signed)
Pt will p/u sched in tx room

## 2016-04-21 NOTE — Progress Notes (Signed)
Records faxed.

## 2016-04-21 NOTE — Progress Notes (Signed)
[name]    HEMATOLOGY ONCOLOGY PROGRESS NOTE  Date of service:  .04/21/2016  Patient Care Team: Cindy Byrd, Cindy Byrd as PCP - General (Family Medicine) Cindy Byrd, Cindy Byrd as Consulting Physician (Hematology and Oncology)  CHIEF COMPLAINTS/PURPOSE OF CONSULTATION: Follow-up for metastatic lung cancer  DIAGNOSIS:   #1 Metastatic non-small cell lung cancer with bilateral lung nodules and large metastatic lesion in the left Ilium. #2 Barrett's esophagus with some evidence of intramucosal adenocarcinoma of the esophagus. (being managed and followed by Cindy Byrd- Gastroenterology)  Treatment  1 Palliative radiation therapy to the large left ilium metastases 2. IV Nivolumab x 20ycles 3. Xgeva 132m Cindy Byrd q4weeks for bone metastases.   HISTORY OF PRESENTING ILLNESS: (plz see my previous consultation for details of initial presentation)  INTERVAL HISTORY  Cindy Byrd here for her scheduled follow-up prior to her next cycle of Nivolumab.  For home which appears to be from mild trauma while working in her yard. No other acute new symptoms. No fevers no chills no acute skin rashes. Her CT chest abdomen pelvis done on 04/19/2016 shows no evidence of disease progression at this time. We went over the results in detail. No new bone pains. Notes that she is not sleeping well enough that the Ambien and wakes up in about 2-3 hours and wonders if we could make any adjustments to that. We'll switch her to Ambien CR. She is okay with this plan. Given refills for her Celexa and Percocet.  MEDICAL HISTORY:  Past Medical History  Diagnosis Date  . IBS (irritable bowel syndrome)   . Bone neoplasm 06/24/2015  . GERD (gastroesophageal reflux disease)   . Vitamin D deficiency disease   . Cigarette smoker two packs a day or less     Currently still smoking 2 PPD - Not interested in quitting at this time.  . Colon polyps     hyperplastic, tubular adenomas, tubulovillous adenoma  . Endometriosis    Hysterectomy with BSO at age 89yrs  . H/O: pneumonia   . Heavy smoker (more than 20 cigarettes per day) 06/24/2015  . Depression 06/24/2015  . Hypertension 06/24/2015    likely improved incidental to 40 lbs weight loss from her neoplasm. No Longer taking med for this as of 08-06-15  . Cancer ([facility]     metastatic poorly differentiated carcinoma. tumor left groin surgical removal with radiation tx.  . Cough, persistent     hx. lung cancer ? primary-being evaluated, unsure of primary site.  . Pain     left hip-persistent"tumor of bone"-radiation tx. 10.  . Swelling of ankle     bilateral  . Pulmonary nodules   . Barrett's esophagus   . Hiatal hernia   . Gastritis   . Esophageal adenocarcinoma ([facility] 08[place]    intramucosal  . Diverticulosis   . Cataract     BILATERAL  . Emphysema of lung ([facility]   . Hyperlipidemia    SURGICAL HISTORY: Past Surgical History  Procedure Laterality Date  . Abdominal hysterectomy    . Tonsillectomy    . Ganglion cyst excision    . Total abdominal hysterectomy w/ bilateral salpingoophorectomy  at age 89yrs    For endometriosis  . Knee arthroscopy  age about 89yrs  . Bartholin gland cyst excision  75yo ago    Does not want if it was an infected cyst or tumor. Was soon as delivery  . Colonoscopy w/ polypectomy      multiple times - last  done 09/2014 per patient.  . Esophagogastroduodenoscopy (egd) with propofol N/A 08/11/2015    Procedure: ESOPHAGOGASTRODUODENOSCOPY (EGD) WITH PROPOFOL;  Surgeon: Cindy Byrd, Cindy Byrd;  Location: [facility];  Service: Gastroenterology;  Laterality: N/A;    SOCIAL HISTORY: Social History   Social History  . Marital Status: Widowed    Spouse Name: N/A  . Number of Children: 2  . Years of Education: N/A   Occupational History  . Not on file.   Social History Main Topics  . Smoking status: Current Every Day Smoker -- 1.00 packs/day for 60 years    Types: Cigarettes  . Smokeless tobacco: Never Used  . Alcohol Use: No   . Drug Use: No  . Sexual Activity: No   Other Topics Concern  . Not on file   Social History Narrative    FAMILY HISTORY: Family History  Problem Relation Age of Onset  . Stroke Mother   . Colon cancer Father   . Colon cancer Brother   . Colon cancer Brother   . Breast cancer Daughter 8    ER/PR+ stage II    ALLERGIES:  is allergic to penicillins; remeron; and latex. patient wonders if she has a penicillin allergy but notes that she is uncertain about this.  MEDICATIONS:  Current Outpatient Prescriptions  Medication Sig Dispense Refill  . amLODipine (NORVASC) 5 MG tablet TAKE 1 TABLET (5 MG TOTAL) BY MOUTH DAILY. 30 tablet 1  . artificial tears (LACRILUBE) OINT ophthalmic ointment Place into both eyes at bedtime. 1 Tube 1  . citalopram (CELEXA) 20 MG tablet Take 1 tablet (20 mg total) by mouth daily. 30 tablet 3  . docusate sodium (COLACE) 100 MG capsule Take 100 mg by mouth daily as needed for mild constipation. Reported on 02/23/2016    . Hypromellose (ARTIFICIAL TEARS) 0.4 % SOLN Apply 1 drop to eye QID. (Patient not taking: Reported on 02/11/2016) 1 Bottle 1  . lidocaine-prilocaine (EMLA) cream Apply small amount over port 1-2 hours prior to treatment, cover with plastic wrap (DO NOT RUB IN). 30 g prn  . NICOTINE STEP 1 21 MG/24HR patch PLACE 1 PATCH (21 MG TOTAL) ONTO THE SKIN DAILY. 28 patch 0  . omeprazole (PRILOSEC) 40 MG capsule TAKE ONE CAPSULE BY MOUTH EVERY DAY *NEEDS OFFICE VISIT FOR FURTHER REFILL* 30 capsule 5  . oxyCODONE-acetaminophen (PERCOCET/ROXICET) 5-325 MG tablet Take 1 tablet by mouth every 4 (four) hours as needed for severe pain. 60 tablet 0  . polycarbophil (FIBERCON) 625 MG tablet Take 2 tablets (1,250 mg total) by mouth 2 (two) times daily. 120 tablet 1  . PRESCRIPTION MEDICATION Antibody Plan [facility]    . valsartan (DIOVAN) 80 MG tablet     . zolpidem (AMBIEN CR) 12.5 MG CR tablet Take 1 tablet (12.5 mg total) by mouth at bedtime as needed for sleep. 30  tablet 0   No current facility-administered medications for this visit.    REVIEW OF SYSTEMS:    10 point review of systems done and was noted to be negative except as noted above.  PHYSICAL EXAMINATION: ECOG PERFORMANCE STATUS: 2 - Symptomatic, <50% confined to bed  Filed Vitals:   04/21/16 1302  BP: 140/62  Pulse: 64  Temp: 97.5 F (36.4 C)  Resp: 18   Filed Weights   04/21/16 1302  Weight: 145 lb (65.772 kg)  . Wt Readings from Last 3 Encounters:  04/21/16 145 lb (65.772 kg)  03/10/16 136 lb 9.6 oz (61.961 kg)  02/23/16 128  lb (58.06 kg)   GENERAL:alert, NAD, appears well. SKIN: skin color, texture, turgor are normal, no rashes or significant lesions EYES: normal, conjunctiva are pink and non-injected, sclera clear OROPHARYNX:no exudate, no erythema and lips, buccal mucosa, and tongue normal  NECK: supple, thyroid normal size, non-tender, without nodularity LYMPH:  no palpable lymphadenopathy in the cervical, axillary or inguinal LUNGS: clear to auscultation and bilateral reduced breath sounds with normal respiratory effort  HEART: regular rate & rhythm and no murmurs and resolved lower extremity edema ABDOMEN:abdomen soft, non-tender and normal bowel sounds Musculoskeletal: No pedal edema. No calf pain or tenderness.  PSYCH: alert & oriented x 3 with fluent speech NEURO: no focal motor/sensory deficits.  LABORATORY DATA:  I have reviewed the data as listed . CBC Latest Ref Rng 04/19/2016 04/07/2016 03/24/2016  WBC 3.9 - 10.3 10e3/uL 6.1 5.1 5.3  Hemoglobin 11.6 - 15.9 g/dL 11.8 12.4 12.6  Hematocrit 34.8 - 46.6 % 35.5 37.3 37.8  Platelets 145 - 400 10e3/uL 164 172 157   . CMP Latest Ref Rng 04/19/2016 04/07/2016 03/24/2016  Glucose 70 - 140 mg/dl 93 79 102  BUN 7.0 - 26.0 mg/dL 13.5 16.5 17.8  Creatinine 0.6 - 1.1 mg/dL 0.8 0.8 0.9  Sodium 136 - 145 mEq/L 140 140 141  Potassium 3.5 - 5.1 mEq/L 3.8 4.0 4.0  CO2 22 - 29 mEq/L 23 24 24   Calcium 8.4 - 10.4 mg/dL 8.7  8.9 9.0  Total Protein 6.4 - 8.3 g/dL 7.4 7.4 7.3  Total Bilirubin 0.20 - 1.20 mg/dL 0.33 0.36 0.36  Alkaline Phos 40 - 150 U/L 74 67 63  AST 5 - 34 U/L 17 19 19   ALT 0 - 55 U/L 9 10 <9     RADIOGRAPHIC STUDIES: I have personally reviewed the radiological images as listed and agreed with the findings in the report. Ct Chest W Contrast  04/19/2016  CLINICAL DATA:  Metastatic non-small-cell left lower lobe lung cancer diagnosed July 2016, presenting for restaging status post chemotherapy. Additional history of intramucosal esophageal carcinoma. EXAM: CT CHEST, ABDOMEN, AND PELVIS WITH CONTRAST TECHNIQUE: Multidetector CT imaging of the chest, abdomen and pelvis was performed following the standard protocol during bolus administration of intravenous contrast. CONTRAST:  182m ISOVUE-300 IOPAMIDOL (ISOVUE-300) INJECTION 61% COMPARISON:  01/14/2016 CT chest, abdomen and pelvis. FINDINGS: CT CHEST Mediastinum/Nodes: Normal heart size. No pericardial fluid/thickening. Right internal jugular MediPort terminates in the lower third of the superior vena cava. Great vessels are normal in course and caliber. No central pulmonary emboli. Normal visualized thyroid. Normal esophagus. No pathologically enlarged axillary, mediastinal or hilar lymph nodes. Lungs/Pleura: No pneumothorax. No pleural effusion. Mild centrilobular emphysema. Stable irregular 12 x 4 mm left lower lobe pulmonary nodule (series 6/ image 70). Three additional scattered pulmonary nodules are all stable, largest 6 mm in the right lower lobe (series 6/ image 82) and 4 mm in the anterior left lower lobe (series 6/ image 66). No acute consolidative airspace disease or new significant pulmonary nodules. Parenchymal band in the lingula suggests mild platelike atelectasis or mild scarring. Musculoskeletal: No aggressive appearing focal osseous lesions. Mild degenerative changes in the thoracic spine. CT ABDOMEN AND PELVIS Hepatobiliary: Stable small simple  liver cysts near the gallbladder fossa, largest 1.5 cm in the right liver lobe. At least 5 additional subcentimeter hypodense liver lesions, too small to characterize, not appreciably changed back to 06/12/2015 suggesting benign lesions. Otherwise normal liver with no new liver lesions. Normal gallbladder with no radiopaque cholelithiasis. No biliary  ductal dilatation. Pancreas: Normal, with no mass or duct dilation. Spleen: Normal size. No mass. Adrenals/Urinary Tract: Normal adrenals. Normal kidneys with no hydronephrosis and no renal mass. Collapsed and grossly normal bladder. Stomach/Bowel: Grossly normal stomach. Normal caliber small bowel with no small bowel wall thickening. Normal appendix. Mild sigmoid diverticulosis, with no large bowel wall thickening or pericolonic fat stranding. Oral contrast progresses to the rectum. Vascular/Lymphatic: Atherosclerotic nonaneurysmal abdominal aorta. Patent portal, splenic, hepatic and renal veins. No pathologically enlarged lymph nodes in the abdomen or pelvis. Reproductive: Status post hysterectomy, with no abnormal findings at the vaginal cuff. No adnexal mass. Other: No pneumoperitoneum, ascites or focal fluid collection. Musculoskeletal: The extraosseous soft tissue component of the large expansile left iliac wing osseous metastasis has largely resolved. There is an extensively comminuted nondisplaced pathologic fracture of the left iliac bone at the site of the metastasis. There is increased sclerosis and periosteal reaction at the left iliac bone metastasis, which may represent a combination of treatment change and healing response to the pathologic fracture. There is new nonspecific mild patchy sclerosis in the pubic symphysis and left superior pubic ramus. IMPRESSION: 1. No new or progressive metastatic disease. 2. Bilateral pulmonary nodules are all stable. 3. Nearly resolved soft tissue component of the large expansile left iliac wing bone metastasis.  Extensively comminuted nondisplaced pathologic fracture at the left iliac bone metastasis. Increased sclerosis and periosteal reaction at the left iliac bone metastasis, favor a combination of treatment change and healing response. New nonspecific mild patchy sclerosis in the pubic symphysis and left superior pubic ramus, favor treatment change recommend attention on follow-up imaging. 4. Additional findings include mild emphysema and mild sigmoid diverticulosis . Electronically Signed   By: Ilona Sorrel M.D.   On: 04/19/2016 15:57   Ct Abdomen Pelvis W Contrast  04/19/2016  CLINICAL DATA:  Metastatic non-small-cell left lower lobe lung cancer diagnosed July 2016, presenting for restaging status post chemotherapy. Additional history of intramucosal esophageal carcinoma. EXAM: CT CHEST, ABDOMEN, AND PELVIS WITH CONTRAST TECHNIQUE: Multidetector CT imaging of the chest, abdomen and pelvis was performed following the standard protocol during bolus administration of intravenous contrast. CONTRAST:  161m ISOVUE-300 IOPAMIDOL (ISOVUE-300) INJECTION 61% COMPARISON:  01/14/2016 CT chest, abdomen and pelvis. FINDINGS: CT CHEST Mediastinum/Nodes: Normal heart size. No pericardial fluid/thickening. Right internal jugular MediPort terminates in the lower third of the superior vena cava. Great vessels are normal in course and caliber. No central pulmonary emboli. Normal visualized thyroid. Normal esophagus. No pathologically enlarged axillary, mediastinal or hilar lymph nodes. Lungs/Pleura: No pneumothorax. No pleural effusion. Mild centrilobular emphysema. Stable irregular 12 x 4 mm left lower lobe pulmonary nodule (series 6/ image 70). Three additional scattered pulmonary nodules are all stable, largest 6 mm in the right lower lobe (series 6/ image 82) and 4 mm in the anterior left lower lobe (series 6/ image 66). No acute consolidative airspace disease or new significant pulmonary nodules. Parenchymal band in the lingula  suggests mild platelike atelectasis or mild scarring. Musculoskeletal: No aggressive appearing focal osseous lesions. Mild degenerative changes in the thoracic spine. CT ABDOMEN AND PELVIS Hepatobiliary: Stable small simple liver cysts near the gallbladder fossa, largest 1.5 cm in the right liver lobe. At least 5 additional subcentimeter hypodense liver lesions, too small to characterize, not appreciably changed back to 06/12/2015 suggesting benign lesions. Otherwise normal liver with no new liver lesions. Normal gallbladder with no radiopaque cholelithiasis. No biliary ductal dilatation. Pancreas: Normal, with no mass or duct dilation. Spleen: Normal size.  No mass. Adrenals/Urinary Tract: Normal adrenals. Normal kidneys with no hydronephrosis and no renal mass. Collapsed and grossly normal bladder. Stomach/Bowel: Grossly normal stomach. Normal caliber small bowel with no small bowel wall thickening. Normal appendix. Mild sigmoid diverticulosis, with no large bowel wall thickening or pericolonic fat stranding. Oral contrast progresses to the rectum. Vascular/Lymphatic: Atherosclerotic nonaneurysmal abdominal aorta. Patent portal, splenic, hepatic and renal veins. No pathologically enlarged lymph nodes in the abdomen or pelvis. Reproductive: Status post hysterectomy, with no abnormal findings at the vaginal cuff. No adnexal mass. Other: No pneumoperitoneum, ascites or focal fluid collection. Musculoskeletal: The extraosseous soft tissue component of the large expansile left iliac wing osseous metastasis has largely resolved. There is an extensively comminuted nondisplaced pathologic fracture of the left iliac bone at the site of the metastasis. There is increased sclerosis and periosteal reaction at the left iliac bone metastasis, which may represent a combination of treatment change and healing response to the pathologic fracture. There is new nonspecific mild patchy sclerosis in the pubic symphysis and left  superior pubic ramus. IMPRESSION: 1. No new or progressive metastatic disease. 2. Bilateral pulmonary nodules are all stable. 3. Nearly resolved soft tissue component of the large expansile left iliac wing bone metastasis. Extensively comminuted nondisplaced pathologic fracture at the left iliac bone metastasis. Increased sclerosis and periosteal reaction at the left iliac bone metastasis, favor a combination of treatment change and healing response. New nonspecific mild patchy sclerosis in the pubic symphysis and left superior pubic ramus, favor treatment change recommend attention on follow-up imaging. 4. Additional findings include mild emphysema and mild sigmoid diverticulosis . Electronically Signed   By: Ilona Sorrel M.D.   On: 04/19/2016 15:57    ASSESSMENT & PLAN:   #1 Metastatic poorly differentiated carcinoma with likely lung primary [non-small cell lung cancer].  CT of the head with and without contrast showed no evidence of metastatic disease. Patient notes much improved pain control. EGFR blood test mutation analysis negative. Patient's pain is much better controlled after radiation for the painful ilium met.  CT chest abdomen pelvis on 08/20/2015 showed improvement in the mediastinal adenopathy, stable lung nodules and some increase in the size of her left ilial lesion.  The increase in the left ilial lesion does not appear to be a true progression since the patient's clinical symptoms have much improved after XRT it might suggest a post CT pre XRT increase in size or pseudo-progression due to tumor infiltrating immune cells.  Patient is status post XRT to her left ilial lesion for palliative control of her symptoms. She has received 16 cycles of Nivolumab. He has tolerated treatment without any prohibitive toxicities. TSH WNL today.  CT chest abdomen pelvis and bone scan 10/21/2015. shows good response to treatment with no evidence of disease progression.  CT chest abd pelvis  01/14/2016- showed continued positive response with no evidence of disease progression.  CT chest abdomen pelvis 04/19/2016 shows no evidence of disease progression  Plan -no significant treatment toxicities noted. -No clinical evidence of disease progression at this time based on recent CT scans -Labs are stable today and the patient is appropriate to proceed with her next cycle of IV Nivolumab  -plan to continue Nivolumab until disease progression or prohibitive toxicities arise. -continue Xgeva q4weeks  - patient's pain is much improved and she is only rarely using Percocet for intermittent pain from her metastatic left ilium disease. -continue labs q2weeks prior to treatments -Will see the patient back in 4 weeks  #2  Anxiety and depression related to her life-threatening diagnosis of cancer . Patient did not tolerate Remeron due to nightmares  Patient overall is in a much better place now with decrease anxiety and improved sleep. Has been eating well with improvement in her weight. Has gained 8 pounds since her last visit in the setting of complete smoking cessation. . Wt Readings from Last 3 Encounters:  04/21/16 145 lb (65.772 kg)  03/10/16 136 lb 9.6 oz (61.961 kg)  02/23/16 128 lb (58.06 kg)   plan -Continue on Celexa 20 mg by mouth daily  -Given prescription for Ambien CR for insomnia. Regular Ambien was causing early arousals at nighttime.   #3 Barrett's esophagus 4cms in the distal esophagus with low and high-grade dysplasia cannot rule out an early intra-mucosal esophageal adenocarcinoma. Plan -Patient being monitored by Cindy. Hilarie Byrd from GI. -Patient was seen at [facility] for further management and saw Cindy Byrd, Cindy Sabina, Cindy Byrd and Cindy Adria Devon. -Increase her omeprazole to 1 tablet 22m by mouth twice a day -They were contemplating endomucosal resection of her lesion. -Given her overall stable status of her metastatic lung cancer/carcinoma of unknown primary would be  agreeable with proceeding with the considered EMR. -Decision for repeat colonoscopy in the setting of a family history and her previous polyps would be dependent on the patient's preference. -[place] [facility] and [facility]indy. PHilarie Byrd -Patient notes that she did not get the prescription and that [facility].  #4 Nicotine addiction -has completely given up smoking which is abated she went for her . Plan I commended her on ongoing smoking cessation.   Recommendations forwarded to [facility] and Cindy. PHilarie Byrd Return to care with Cindy. KIrene Limboin 4weeks with CBC, CMP and CT chest abdomen pelvis . Continue treatment every 2 weeks as per plan .  All questions were answered. The patient knows to call the clinic with any problems, questions or concerns.  I spent 25 minutes counseling the patient face to face including discussion of her imaging results, treatment plan and smoking cessation .The total time spent in the appointment was 25 minutes and more than 50% was on counseling.  GSullivan LoneMD MPryor CreekHematology/Oncology Physician [facility] (Office): [phone](Work cell): [phone](Fax): [phone]

## 2016-04-21 NOTE — Telephone Encounter (Signed)
Per staff message and POF I have scheduled appts. Advised scheduler of appts and to  Move lab/flush. JMW

## 2016-05-05 ENCOUNTER — Other Ambulatory Visit: Payer: Self-pay | Admitting: *Deleted

## 2016-05-05 ENCOUNTER — Other Ambulatory Visit (HOSPITAL_BASED_OUTPATIENT_CLINIC_OR_DEPARTMENT_OTHER): Payer: Medicare Other

## 2016-05-05 ENCOUNTER — Other Ambulatory Visit: Payer: Medicare Other

## 2016-05-05 ENCOUNTER — Ambulatory Visit (HOSPITAL_BASED_OUTPATIENT_CLINIC_OR_DEPARTMENT_OTHER): Payer: Medicare Other

## 2016-05-05 ENCOUNTER — Ambulatory Visit: Payer: Medicare Other

## 2016-05-05 VITALS — BP 152/72 | HR 67 | Temp 98.7°F | Resp 18

## 2016-05-05 DIAGNOSIS — Z95828 Presence of other vascular implants and grafts: Secondary | ICD-10-CM

## 2016-05-05 DIAGNOSIS — C799 Secondary malignant neoplasm of unspecified site: Secondary | ICD-10-CM

## 2016-05-05 DIAGNOSIS — C3490 Malignant neoplasm of unspecified part of unspecified bronchus or lung: Secondary | ICD-10-CM

## 2016-05-05 DIAGNOSIS — E876 Hypokalemia: Secondary | ICD-10-CM

## 2016-05-05 LAB — CBC & DIFF AND RETIC
BASO%: 0.6 % (ref 0.0–2.0)
BASOS ABS: 0 10*3/uL (ref 0.0–0.1)
EOS%: 2.3 % (ref 0.0–7.0)
Eosinophils Absolute: 0.1 10*3/uL (ref 0.0–0.5)
HCT: 31.9 % — ABNORMAL LOW (ref 34.8–46.6)
HGB: 10.5 g/dL — ABNORMAL LOW (ref 11.6–15.9)
Immature Retic Fract: 9.4 % (ref 1.60–10.00)
LYMPH#: 0.9 10*3/uL (ref 0.9–3.3)
LYMPH%: 13.4 % — ABNORMAL LOW (ref 14.0–49.7)
MCH: 28.3 pg (ref 25.1–34.0)
MCHC: 32.9 g/dL (ref 31.5–36.0)
MCV: 85.9 fL (ref 79.5–101.0)
MONO#: 0.9 10*3/uL (ref 0.1–0.9)
MONO%: 13.2 % (ref 0.0–14.0)
NEUT#: 4.6 10*3/uL (ref 1.5–6.5)
NEUT%: 70.5 % (ref 38.4–76.8)
Platelets: 280 10*3/uL (ref 145–400)
RBC: 3.71 10*6/uL (ref 3.70–5.45)
RDW: 13.5 % (ref 11.2–14.5)
RETIC %: 1.5 % (ref 0.70–2.10)
RETIC CT ABS: 55.65 10*3/uL (ref 33.70–90.70)
WBC: 6.5 10*3/uL (ref 3.9–10.3)

## 2016-05-05 LAB — COMPREHENSIVE METABOLIC PANEL
ALT: <9 U/L (ref 0–55)
ANION GAP: 8 meq/L (ref 3–11)
AST: 14 U/L (ref 5–34)
Albumin: 2.9 g/dL — ABNORMAL LOW (ref 3.5–5.0)
Alkaline Phosphatase: 74 U/L (ref 40–150)
BUN: 13.4 mg/dL (ref 7.0–26.0)
CALCIUM: 8.8 mg/dL (ref 8.4–10.4)
CHLORIDE: 108 meq/L (ref 98–109)
CO2: 23 meq/L (ref 22–29)
CREATININE: 0.9 mg/dL (ref 0.6–1.1)
EGFR: 65 mL/min/{1.73_m2} — AB (ref >90)
Glucose: 96 mg/dl (ref 70–140)
Potassium: 2.8 mEq/L — CL (ref 3.5–5.1)
Sodium: 139 mEq/L (ref 136–145)
TOTAL PROTEIN: 7.2 g/dL (ref 6.4–8.3)
Total Bilirubin: 0.58 mg/dL (ref 0.20–1.20)

## 2016-05-05 MED ORDER — POTASSIUM CHLORIDE ER 20 MEQ PO TBCR: EXTENDED_RELEASE_TABLET | ORAL | Status: DC

## 2016-05-05 MED ORDER — SODIUM CHLORIDE 0.9 % IJ SOLN
10.0000 mL | INTRAMUSCULAR | Status: AC | PRN
  Administered 2016-05-05: 10 mL
  Filled 2016-05-05: qty 10

## 2016-05-05 MED ORDER — HEPARIN SOD (PORK) LOCK FLUSH 100 UNIT/ML IV SOLN
500.0000 [IU] | INTRAVENOUS | Status: AC | PRN
  Administered 2016-05-05: 500 [IU]
  Filled 2016-05-05: qty 5

## 2016-05-05 MED ORDER — PREDNISONE 20 MG PO TABS: ORAL_TABLET | ORAL | Status: DC

## 2016-05-05 NOTE — Progress Notes (Signed)
Oncology Short Note  Patient notes watery diarrhea about 5-6 times a day with some stool urgency and minimal abdominal cramping for the last 1-2 weeks. She notes that she's had some similar symptoms with her irritable bowel syndrome in the past but that had resolved and that her diarrhea is now new. No fevers or chills he had no significant abdominal pain. We discussed that this is likely a grade 1 ductal colitis related to Nivolumab. PLAN -Will hold the dose of Nivolumab today.  -Will treat with prednisone taper -Patient counseled on calling us immediately if diarrhea gets worse or if she develops more abdominal pain. -Will return to clinic in 2 weeks prior to her next scheduled dose of Nivolumab prior to reassessment. -Patient understands the plan of care.  After the patient left her CMP levels came back and her potassium was low at 2.8 likely related to her ongoing diarrhea. Significant change in bicarbonate level or AG. -Prescription for oral potassium was sent to her pharmacy and my nurse called her to stop taking the potassium immediately. With the plan to recheck her [facility] and CMP on Monday 05/09/2016.  Sullivan Lone MD MS Hematology/Oncology Physician [facility] ([facility])

## 2016-05-05 NOTE — Progress Notes (Signed)
HOLD treatment today per MD Irene Limbo due to bowel concerns.

## 2016-05-05 NOTE — Patient Instructions (Signed)

## 2016-05-05 NOTE — Progress Notes (Signed)
Pt reports her "IBS is acting up" and she has had this for "the last 3 weeks" and she has been taking her antidiarrheal medications as told and she is only able to really tolerated liquids and not food. She is alert and oriented and MD Irene Limbo is coming to evaluate.

## 2016-05-06 ENCOUNTER — Telehealth: Payer: Self-pay | Admitting: *Deleted

## 2016-05-06 NOTE — Telephone Encounter (Signed)
TC from patient inquiring about upcoming appts. Reviewed appts with her and she voiced understanding. She is taking new meds for diarrhea as ordered vand is feeling better today. She understands that Monday's appt is for labs only and she will see Dr. Irene Limbo on the 15th.  No other needs identified.

## 2016-05-09 ENCOUNTER — Ambulatory Visit (HOSPITAL_BASED_OUTPATIENT_CLINIC_OR_DEPARTMENT_OTHER): Payer: Medicare Other

## 2016-05-09 ENCOUNTER — Other Ambulatory Visit (HOSPITAL_BASED_OUTPATIENT_CLINIC_OR_DEPARTMENT_OTHER): Payer: Medicare Other

## 2016-05-09 DIAGNOSIS — C3490 Malignant neoplasm of unspecified part of unspecified bronchus or lung: Secondary | ICD-10-CM | POA: Diagnosis not present

## 2016-05-09 DIAGNOSIS — E876 Hypokalemia: Secondary | ICD-10-CM | POA: Diagnosis not present

## 2016-05-09 DIAGNOSIS — Z95828 Presence of other vascular implants and grafts: Secondary | ICD-10-CM

## 2016-05-09 LAB — CBC & DIFF AND RETIC
BASO%: 0.1 % (ref 0.0–2.0)
Basophils Absolute: 0 10*3/uL (ref 0.0–0.1)
EOS%: 0 % (ref 0.0–7.0)
Eosinophils Absolute: 0 10*3/uL (ref 0.0–0.5)
HCT: 35.3 % (ref 34.8–46.6)
HGB: 11.9 g/dL (ref 11.6–15.9)
IMMATURE RETIC FRACT: 14.2 % — AB (ref 1.60–10.00)
LYMPH%: 8 % — AB (ref 14.0–49.7)
MCH: 29.2 pg (ref 25.1–34.0)
MCHC: 33.7 g/dL (ref 31.5–36.0)
MCV: 86.5 fL (ref 79.5–101.0)
MONO#: 0.8 10*3/uL (ref 0.1–0.9)
MONO%: 6.9 % (ref 0.0–14.0)
NEUT#: 10.2 10*3/uL — ABNORMAL HIGH (ref 1.5–6.5)
NEUT%: 85 % — AB (ref 38.4–76.8)
PLATELETS: 330 10*3/uL (ref 145–400)
RBC: 4.08 10*6/uL (ref 3.70–5.45)
RDW: 13.5 % (ref 11.2–14.5)
Retic %: 1.77 % (ref 0.70–2.10)
Retic Ct Abs: 72.22 10*3/uL (ref 33.70–90.70)
WBC: 12 10*3/uL — AB (ref 3.9–10.3)
lymph#: 1 10*3/uL (ref 0.9–3.3)

## 2016-05-09 LAB — COMPREHENSIVE METABOLIC PANEL
ANION GAP: 9 meq/L (ref 3–11)
AST: 13 U/L (ref 5–34)
Albumin: 3.4 g/dL — ABNORMAL LOW (ref 3.5–5.0)
Alkaline Phosphatase: 70 U/L (ref 40–150)
BUN: 17.8 mg/dL (ref 7.0–26.0)
CHLORIDE: 106 meq/L (ref 98–109)
CO2: 24 meq/L (ref 22–29)
CREATININE: 0.9 mg/dL (ref 0.6–1.1)
Calcium: 9.3 mg/dL (ref 8.4–10.4)
EGFR: 62 mL/min/{1.73_m2} — ABNORMAL LOW (ref >90)
GLUCOSE: 103 mg/dL (ref 70–140)
Potassium: 3.5 mEq/L (ref 3.5–5.1)
SODIUM: 139 meq/L (ref 136–145)
Total Bilirubin: 0.36 mg/dL (ref 0.20–1.20)
Total Protein: 7.7 g/dL (ref 6.4–8.3)

## 2016-05-09 LAB — MAGNESIUM: Magnesium: 1.7 mg/dl (ref 1.5–2.5)

## 2016-05-09 MED ORDER — HEPARIN SOD (PORK) LOCK FLUSH 100 UNIT/ML IV SOLN
500.0000 [IU] | Freq: Once | INTRAVENOUS | Status: AC
Start: 2016-05-09 — End: 2016-05-09
  Administered 2016-05-09: 500 [IU] via INTRAVENOUS
  Filled 2016-05-09: qty 5

## 2016-05-09 MED ORDER — SODIUM CHLORIDE 0.9% FLUSH
10.0000 mL | INTRAVENOUS | Status: DC | PRN
  Administered 2016-05-09: 10 mL via INTRAVENOUS
  Filled 2016-05-09: qty 10

## 2016-05-19 ENCOUNTER — Other Ambulatory Visit: Payer: Self-pay | Admitting: *Deleted

## 2016-05-19 ENCOUNTER — Ambulatory Visit (HOSPITAL_BASED_OUTPATIENT_CLINIC_OR_DEPARTMENT_OTHER): Payer: Medicare Other | Admitting: Hematology

## 2016-05-19 ENCOUNTER — Ambulatory Visit (HOSPITAL_BASED_OUTPATIENT_CLINIC_OR_DEPARTMENT_OTHER): Payer: Medicare Other

## 2016-05-19 ENCOUNTER — Telehealth: Payer: Self-pay | Admitting: Hematology

## 2016-05-19 ENCOUNTER — Other Ambulatory Visit (HOSPITAL_BASED_OUTPATIENT_CLINIC_OR_DEPARTMENT_OTHER): Payer: Medicare Other

## 2016-05-19 ENCOUNTER — Encounter: Payer: Self-pay | Admitting: Hematology

## 2016-05-19 VITALS — BP 176/61 | HR 64 | Temp 98.3°F | Resp 18 | Wt 139.5 lb

## 2016-05-19 VITALS — BP 176/66 | HR 60

## 2016-05-19 DIAGNOSIS — C7951 Secondary malignant neoplasm of bone: Secondary | ICD-10-CM

## 2016-05-19 DIAGNOSIS — C3490 Malignant neoplasm of unspecified part of unspecified bronchus or lung: Secondary | ICD-10-CM

## 2016-05-19 DIAGNOSIS — G893 Neoplasm related pain (acute) (chronic): Secondary | ICD-10-CM

## 2016-05-19 DIAGNOSIS — K521 Toxic gastroenteritis and colitis: Secondary | ICD-10-CM | POA: Insufficient documentation

## 2016-05-19 DIAGNOSIS — Z720 Tobacco use: Secondary | ICD-10-CM

## 2016-05-19 DIAGNOSIS — R197 Diarrhea, unspecified: Secondary | ICD-10-CM | POA: Diagnosis not present

## 2016-05-19 DIAGNOSIS — Z95828 Presence of other vascular implants and grafts: Secondary | ICD-10-CM

## 2016-05-19 DIAGNOSIS — E876 Hypokalemia: Secondary | ICD-10-CM

## 2016-05-19 LAB — COMPREHENSIVE METABOLIC PANEL
ALT: 9 U/L (ref 0–55)
AST: 11 U/L (ref 5–34)
Albumin: 3.1 g/dL — ABNORMAL LOW (ref 3.5–5.0)
Alkaline Phosphatase: 66 U/L (ref 40–150)
Anion Gap: 8 mEq/L (ref 3–11)
BUN: 20.4 mg/dL (ref 7.0–26.0)
CALCIUM: 9.3 mg/dL (ref 8.4–10.4)
CHLORIDE: 107 meq/L (ref 98–109)
CO2: 26 meq/L (ref 22–29)
CREATININE: 0.8 mg/dL (ref 0.6–1.1)
EGFR: 74 mL/min/{1.73_m2} — ABNORMAL LOW (ref >90)
Glucose: 81 mg/dl (ref 70–140)
Potassium: 2.9 mEq/L — CL (ref 3.5–5.1)
Sodium: 141 mEq/L (ref 136–145)
TOTAL PROTEIN: 6.5 g/dL (ref 6.4–8.3)
Total Bilirubin: 0.43 mg/dL (ref 0.20–1.20)

## 2016-05-19 LAB — CBC & DIFF AND RETIC
BASO%: 0.1 % (ref 0.0–2.0)
BASOS ABS: 0 10*3/uL (ref 0.0–0.1)
EOS ABS: 0.1 10*3/uL (ref 0.0–0.5)
EOS%: 0.4 % (ref 0.0–7.0)
HEMATOCRIT: 35.7 % (ref 34.8–46.6)
HEMOGLOBIN: 11.7 g/dL (ref 11.6–15.9)
Immature Retic Fract: 2.8 % (ref 1.60–10.00)
LYMPH%: 11.3 % — AB (ref 14.0–49.7)
MCH: 28.8 pg (ref 25.1–34.0)
MCHC: 32.8 g/dL (ref 31.5–36.0)
MCV: 87.9 fL (ref 79.5–101.0)
MONO#: 1.3 10*3/uL — ABNORMAL HIGH (ref 0.1–0.9)
MONO%: 8.8 % (ref 0.0–14.0)
NEUT#: 11.5 10*3/uL — ABNORMAL HIGH (ref 1.5–6.5)
NEUT%: 79.4 % — ABNORMAL HIGH (ref 38.4–76.8)
Platelets: 207 10*3/uL (ref 145–400)
RBC: 4.06 10*6/uL (ref 3.70–5.45)
RDW: 14.6 % — ABNORMAL HIGH (ref 11.2–14.5)
Retic %: 1.52 % (ref 0.70–2.10)
Retic Ct Abs: 61.71 10*3/uL (ref 33.70–90.70)
WBC: 14.5 10*3/uL — AB (ref 3.9–10.3)
lymph#: 1.6 10*3/uL (ref 0.9–3.3)

## 2016-05-19 MED ORDER — OXYCODONE-ACETAMINOPHEN 5-325 MG PO TABS: 1.0000 | ORAL_TABLET | ORAL | Status: DC | PRN

## 2016-05-19 MED ORDER — ZOLPIDEM TARTRATE ER 12.5 MG PO TBCR: 12.5000 mg | EXTENDED_RELEASE_TABLET | Freq: Every evening | ORAL | Status: DC | PRN

## 2016-05-19 MED ORDER — SODIUM CHLORIDE 0.9 % IV SOLN: INTRAVENOUS | Status: DC

## 2016-05-19 MED ORDER — SODIUM CHLORIDE 0.9 % IV SOLN
Freq: Once | INTRAVENOUS | Status: AC
  Administered 2016-05-19: 15:00:00 via INTRAVENOUS
  Filled 2016-05-19: qty 1000

## 2016-05-19 MED ORDER — SODIUM CHLORIDE 0.9 % IV SOLN: Freq: Once | INTRAVENOUS | Status: DC

## 2016-05-19 MED ORDER — HEPARIN SOD (PORK) LOCK FLUSH 100 UNIT/ML IV SOLN
500.0000 [IU] | Freq: Once | INTRAVENOUS | Status: AC
  Administered 2016-05-19: 500 [IU] via INTRAVENOUS
  Filled 2016-05-19: qty 5

## 2016-05-19 MED ORDER — LORAZEPAM 1 MG PO TABS: 1.0000 mg | ORAL_TABLET | Freq: Three times a day (TID) | ORAL | Status: DC | PRN

## 2016-05-19 MED ORDER — SODIUM CHLORIDE 0.9 % IJ SOLN
10.0000 mL | INTRAMUSCULAR | Status: AC | PRN
  Administered 2016-05-19: 10 mL
  Filled 2016-05-19: qty 10

## 2016-05-19 MED ORDER — SODIUM CHLORIDE 0.9 % IV SOLN
Freq: Once | INTRAVENOUS | Status: AC
  Administered 2016-05-19: 17:00:00 via INTRAVENOUS
  Filled 2016-05-19: qty 1000

## 2016-05-19 MED ORDER — PREDNISONE 20 MG PO TABS: ORAL_TABLET | ORAL | Status: DC

## 2016-05-19 MED ORDER — POTASSIUM CHLORIDE CRYS ER 20 MEQ PO TBCR: EXTENDED_RELEASE_TABLET | ORAL | Status: DC

## 2016-05-19 NOTE — Patient Instructions (Signed)

## 2016-05-19 NOTE — Patient Instructions (Signed)

## 2016-05-19 NOTE — Telephone Encounter (Signed)
Gave pt cal & avs °

## 2016-05-19 NOTE — Telephone Encounter (Signed)
Added flush per desk nurse

## 2016-05-24 NOTE — Progress Notes (Signed)
[name]    HEMATOLOGY ONCOLOGY PROGRESS NOTE  Date of service:  05/19/2016   Patient Care Team: Tamsen Roers, MD as PCP - General (Family Medicine) [name], MD as Consulting Physician (Hematology and Oncology)  CHIEF COMPLAINTS/PURPOSE OF CONSULTATION: Follow-up for metastatic lung cancer  DIAGNOSIS:   #1 Metastatic non-small cell lung cancer with bilateral lung nodules and large metastatic lesion in the left Ilium. #2 Barrett's esophagus with some evidence of intramucosal adenocarcinoma of the esophagus. (being managed and followed by Dr [name]- Gastroenterology)  Treatment  1 Palliative radiation therapy to the large left ilium metastases 2. IV Nivolumab x 20ycles 3. Xgeva 141m [place] q4weeks for bone metastases.   HISTORY OF PRESENTING ILLNESS: (plz see my previous consultation for details of initial presentation)  INTERVAL HISTORY  Ms TGassertis here for her scheduled follow-up prior to her next cycle of Nivolumab. Her last dose of involvement was held due to grade 1-2 diarrhea with some abdominal cramping and hypokalemia. She received IV fluids and potassium replacement. 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 She is here for follow-up. She notes that her diarrhea has improved and she no longer has abdominal cramping. Still having 3-5 bowel movements a day. We discussed that we will have to continue holding her Nivolumab at this time and increase her prednisone and do a slower taper. No abdominal pain. No blood in the stools. No nausea or vomiting. No fevers or chills. Patient is somewhat anxious about having missed her treatment doses but understand why it's the case.  MEDICAL HISTORY:  Past Medical History  Diagnosis Date  . IBS (irritable bowel syndrome)   . Bone neoplasm 06/24/2015  . GERD (gastroesophageal reflux disease)   . Vitamin D deficiency disease   . Cigarette smoker two packs a day or less     Currently still smoking 2 PPD - Not  interested in quitting at this time.  . Colon polyps     hyperplastic, tubular adenomas, tubulovillous adenoma  . Endometriosis     Hysterectomy with BSO at age [age]yrs  . H/O: pneumonia   . Heavy smoker (more than 20 cigarettes per day) 06/24/2015  . Depression 06/24/2015  . Hypertension 06/24/2015    likely improved incidental to 40 lbs weight loss from her neoplasm. No Longer taking med for this as of 08-06-15  . Cancer ([facility]     metastatic poorly differentiated carcinoma. tumor left groin surgical removal with radiation tx.  . Cough, persistent     hx. lung cancer ? primary-being evaluated, unsure of primary site.  . Pain     left hip-persistent"tumor of bone"-radiation tx. 10.  . Swelling of ankle     bilateral  . Pulmonary nodules   . Barrett's esophagus   . Hiatal hernia   . Gastritis   . [place] [place]    intramucosal  . Diverticulosis   . Cataract     BILATERAL  . Emphysema of lung ([facility]   . Hyperlipidemia    SURGICAL HISTORY: Past Surgical History  Procedure Laterality Date  . Abdominal hysterectomy    . Tonsillectomy    . Ganglion cyst excision    . Total abdominal hysterectomy w/ bilateral salpingoophorectomy  at age [age]yrs    For endometriosis  . Knee arthroscopy  age about 89yrs  . Bartholin gland cyst excision  75yo ago    Does not want if it was an infected cyst or tumor.  Was soon as delivery  . Colonoscopy w/ polypectomy      multiple times - last done 09/2014 per patient.  . Esophagogastroduodenoscopy (egd) with propofol N/A 08/11/2015    Procedure: ESOPHAGOGASTRODUODENOSCOPY (EGD) WITH PROPOFOL;  Surgeon: [name], MD;  Location: [facility];  Service: Gastroenterology;  Laterality: N/A;    SOCIAL HISTORY: Social History   Social History  . Marital Status: Widowed    Spouse Name: N/A  . Number of Children: 2  . Years of Education: N/A   Occupational History  . Not on file.   Social History Main Topics  . Smoking  status: Current Every Day Smoker -- 1.00 packs/day for 60 years    Types: Cigarettes  . Smokeless tobacco: Never Used  . Alcohol Use: No  . Drug Use: No  . Sexual Activity: No   Other Topics Concern  . Not on file   Social History Narrative    FAMILY HISTORY: Family History  Problem Relation Age of Onset  . Stroke Mother   . Colon cancer Father   . Colon cancer Brother   . Colon cancer Brother   . Breast cancer Daughter 18    ER/PR+ stage II    ALLERGIES:  is allergic to penicillins; remeron; and latex. patient wonders if she has a penicillin allergy but notes that she is uncertain about this.  MEDICATIONS:  Current Outpatient Prescriptions  Medication Sig Dispense Refill  . amLODipine (NORVASC) 5 MG tablet TAKE 1 TABLET (5 MG TOTAL) BY MOUTH DAILY. 30 tablet 1  . artificial tears (LACRILUBE) OINT ophthalmic ointment Place into both eyes at bedtime. 1 Tube 1  . citalopram (CELEXA) 20 MG tablet Take 1 tablet (20 mg total) by mouth daily. 30 tablet 3  . docusate sodium (COLACE) 100 MG capsule Take 100 mg by mouth daily as needed for mild constipation. Reported on 02/23/2016    . Hypromellose (ARTIFICIAL TEARS) 0.4 % SOLN Apply 1 drop to eye QID. (Patient not taking: Reported on 02/11/2016) 1 Bottle 1  . lidocaine-prilocaine (EMLA) cream Apply small amount over port 1-2 hours prior to treatment, cover with plastic wrap (DO NOT RUB IN). 30 g prn  . LORazepam (ATIVAN) 1 MG tablet Take 1 tablet (1 mg total) by mouth every 8 (eight) hours as needed for anxiety (or nausea). 60 tablet 0  . NICOTINE STEP 1 21 MG/24HR patch PLACE 1 PATCH (21 MG TOTAL) ONTO THE SKIN DAILY. 28 patch 0  . omeprazole (PRILOSEC) 40 MG capsule TAKE ONE CAPSULE BY MOUTH EVERY DAY *NEEDS OFFICE VISIT FOR FURTHER REFILL* 30 capsule 5  . oxyCODONE-acetaminophen (PERCOCET/ROXICET) 5-325 MG tablet Take 1 tablet by mouth every 4 (four) hours as needed for severe pain. 60 tablet 0  . polycarbophil (FIBERCON) 625 MG tablet  Take 2 tablets (1,250 mg total) by mouth 2 (two) times daily. 120 tablet 1  . potassium chloride 20 MEQ TBCR 75mq (2 times daily) PO three times a days for 1 day then 2 times daily x 3 days then once daily 30 tablet 0  . potassium chloride SA (K-DUR,KLOR-CON) 20 MEQ tablet 415m 2 times daily x 4 days then 401monce daily 40 tablet 0  . predniSONE (DELTASONE) 20 MG tablet (with food)100m72m daily x 2 days then 80mg88mdaily x 7 days then 60mg 64maily x 7 days then 40mg p53mily x 7 days then 20mg po68mly 100 tablet 0  . PRESCRIPTION MEDICATION Antibody Plan [facility]    .  valsartan (DIOVAN) 80 MG tablet     . zolpidem (AMBIEN CR) 12.5 MG CR tablet Take 1 tablet (12.5 mg total) by mouth at bedtime as needed for sleep. 30 tablet 0   No current facility-administered medications for this visit.    REVIEW OF SYSTEMS:    10 point review of systems done and was noted to be negative except as noted above.  PHYSICAL EXAMINATION: ECOG PERFORMANCE STATUS: 2 - Symptomatic, <50% confined to bed  Filed Vitals:   05/19/16 1257  BP: 176/61  Pulse: 64  Temp: 98.3 F (36.8 C)  Resp: 18   Filed Weights   05/19/16 1257  Weight: 139 lb 8 oz (63.277 kg)  . Wt Readings from Last 3 Encounters:  05/19/16 139 lb 8 oz (63.277 kg)  04/21/16 145 lb (65.772 kg)  03/10/16 136 lb 9.6 oz (61.961 kg)   GENERAL:alert, NAD, appears well. SKIN: skin color, texture, turgor are normal, no rashes or significant lesions EYES: normal, conjunctiva are pink and non-injected, sclera clear OROPHARYNX:no exudate, no erythema and lips, buccal mucosa, and tongue normal  NECK: supple, thyroid normal size, non-tender, without nodularity LYMPH:  no palpable lymphadenopathy in the cervical, axillary or inguinal LUNGS: clear to auscultation and bilateral reduced breath sounds with normal respiratory effort  HEART: regular rate & rhythm and no murmurs and resolved lower extremity edema ABDOMEN:abdomen soft, non-tender and  normal bowel sounds Musculoskeletal: No pedal edema. No calf pain or tenderness.  PSYCH: alert & oriented x 3 with fluent speech NEURO: no focal motor/sensory deficits.  LABORATORY DATA:  I have reviewed the data as listed . CBC Latest Ref Rng 05/19/2016 05/09/2016 05/05/2016  WBC 3.9 - 10.3 10e3/uL 14.5(H) 12.0(H) 6.5  Hemoglobin 11.6 - 15.9 g/dL 11.7 11.9 10.5(L)  Hematocrit 34.8 - 46.6 % 35.7 35.3 31.9(L)  Platelets 145 - 400 10e3/uL 207 330 280   . CMP Latest Ref Rng 05/19/2016 05/09/2016 05/05/2016  Glucose 70 - 140 mg/dl 81 103 96  BUN 7.0 - 26.0 mg/dL 20.4 17.8 13.4  Creatinine 0.6 - 1.1 mg/dL 0.8 0.9 0.9  Sodium 136 - 145 mEq/L 141 139 139  Potassium 3.5 - 5.1 mEq/L 2.9(LL) 3.5 2.8(LL)  CO2 22 - 29 mEq/L 26 24 23   Calcium 8.4 - 10.4 mg/dL 9.3 9.3 8.8  Total Protein 6.4 - 8.3 g/dL 6.5 7.7 7.2  Total Bilirubin 0.20 - 1.20 mg/dL 0.43 0.36 0.58  Alkaline Phos 40 - 150 U/L 66 70 74  AST 5 - 34 U/L 11 13 14   ALT 0 - 55 U/L 9 <9 <9     RADIOGRAPHIC STUDIES: I have personally reviewed the radiological images as listed and agreed with the findings in the report. No results found.  ASSESSMENT & PLAN:   #1 Metastatic poorly differentiated carcinoma with likely lung primary [non-small cell lung cancer].  CT of the head with and without contrast showed no evidence of metastatic disease. Patient notes much improved pain control. EGFR blood test mutation analysis negative. Patient's pain is much better controlled after radiation for the painful ilium met.  CT chest abdomen pelvis on 08/20/2015 showed improvement in the mediastinal adenopathy, stable lung nodules and some increase in the size of her left ilial lesion.  The increase in the left ilial lesion does not appear to be a true progression since the patient's clinical symptoms have much improved after XRT it might suggest a post CT pre XRT increase in size or pseudo-progression due to tumor infiltrating immune cells.  Patient is  status post XRT to her left ilial lesion for palliative control of her symptoms. She has received 16 cycles of Nivolumab. He has tolerated treatment without any prohibitive toxicities. TSH WNL today.  CT chest abdomen pelvis and bone scan 10/21/2015. shows good response to treatment with no evidence of disease progression.  CT chest abd pelvis 01/14/2016- showed continued positive response with no evidence of disease progression.  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 #3 hypokalemia due to diarrhea Plan Her Nivolumab dose was held last week but and she has been on a prednisone taper set. Diarrhea and abdominal discomfort is better but not resolved. -We will increase prednisone to 1.5 mg per meter squared and do a slower taper . -GI prophylaxis while on his dose steroids -Continue to hod Nivolumab at this time. -stoo9l c diff and stool culture -might need to consider GI consultation for colonoscopy if symptoms do not improve . -Patient has been counseled to call us immediately for worsening symptoms . -Hold all laxatives . - patient's pain is much improved and she is only rarely using Percocet for intermittent pain from her metastatic left ilium disease. -Continue oral potassium replacement -Return to care in one week with repeat labs .  #2 Barrett's esophagus 4cms in the distal esophagus with low and high-grade dysplasia cannot rule out an early intra-mucosal esophageal adenocarcinoma. Plan -Patient being monitored by Dr. [name] from GI. -Patient was [facility] at [facility] for [facility] and saw Dr.Kochar, Arie Sabina, MD and Dr Adria Devon.  Return to care with Dr. Irene Limbo in 1 week with CBC, CMP to monitor for resolution of diarrhea on steroids . Treatment on hold at this time.  . Orders Placed This Encounter  Procedures  . Fecal, C-Dff PCR    Standing Status: Future      Number of Occurrences:      Standing Expiration Date: 05/19/2017    Order Specific Question:  Is your patient experiencing loose or watery stools (3 or more in 24 hours)?    Answer:  Yes    Order Specific Question:  Has the patient received laxatives in the last 24 hours?    Answer:  No    Order Specific Question:  Has a negative Cdiff test resulted in the last 7 days?    Answer:  No  . Culture, Stool    Standing Status: Future     Number of Occurrences:      Standing Expiration Date: 05/19/2017    All questions were answered. The patient knows to call the clinic with any problems, questions or concerns.  Sullivan Lone MD [place] [facility] Physician [facility]  (Office): [phone] (Work cell): [phone] (Fax): [phone]

## 2016-05-25 ENCOUNTER — Other Ambulatory Visit: Payer: Self-pay | Admitting: *Deleted

## 2016-05-25 DIAGNOSIS — C3490 Malignant neoplasm of unspecified part of unspecified bronchus or lung: Secondary | ICD-10-CM

## 2016-05-26 ENCOUNTER — Ambulatory Visit (HOSPITAL_BASED_OUTPATIENT_CLINIC_OR_DEPARTMENT_OTHER): Payer: Medicare Other | Admitting: Hematology

## 2016-05-26 ENCOUNTER — Ambulatory Visit (HOSPITAL_BASED_OUTPATIENT_CLINIC_OR_DEPARTMENT_OTHER): Payer: Medicare Other

## 2016-05-26 ENCOUNTER — Other Ambulatory Visit (HOSPITAL_COMMUNITY)
Admission: RE | Admit: 2016-05-26 | Discharge: 2016-05-26 | Disposition: A | Discharge status: Home / Self Care | Payer: Medicare Other | Source: Ambulatory Visit | Attending: Hematology | Admitting: Hematology

## 2016-05-26 ENCOUNTER — Telehealth: Payer: Self-pay | Admitting: Hematology

## 2016-05-26 ENCOUNTER — Encounter: Payer: Self-pay | Admitting: Hematology

## 2016-05-26 ENCOUNTER — Other Ambulatory Visit: Payer: Medicare Other

## 2016-05-26 ENCOUNTER — Other Ambulatory Visit (HOSPITAL_BASED_OUTPATIENT_CLINIC_OR_DEPARTMENT_OTHER): Payer: Medicare Other

## 2016-05-26 VITALS — BP 158/67 | HR 71 | Temp 98.5°F | Resp 16 | Wt 140.1 lb

## 2016-05-26 DIAGNOSIS — Z95828 Presence of other vascular implants and grafts: Secondary | ICD-10-CM

## 2016-05-26 DIAGNOSIS — C7951 Secondary malignant neoplasm of bone: Secondary | ICD-10-CM

## 2016-05-26 DIAGNOSIS — R197 Diarrhea, unspecified: Secondary | ICD-10-CM

## 2016-05-26 DIAGNOSIS — C3490 Malignant neoplasm of unspecified part of unspecified bronchus or lung: Secondary | ICD-10-CM | POA: Insufficient documentation

## 2016-05-26 DIAGNOSIS — K521 Toxic gastroenteritis and colitis: Secondary | ICD-10-CM

## 2016-05-26 DIAGNOSIS — K22710 Barrett's esophagus with low grade dysplasia: Secondary | ICD-10-CM

## 2016-05-26 DIAGNOSIS — K22711 Barrett's esophagus with high grade dysplasia: Secondary | ICD-10-CM | POA: Diagnosis not present

## 2016-05-26 LAB — COMPREHENSIVE METABOLIC PANEL
ALBUMIN: 3.2 g/dL — AB (ref 3.5–5.0)
ALK PHOS: 65 U/L (ref 40–150)
ALT: 15 U/L (ref 0–55)
ANION GAP: 8 meq/L (ref 3–11)
AST: 15 U/L (ref 5–34)
BILIRUBIN TOTAL: 0.53 mg/dL (ref 0.20–1.20)
BUN: 23.2 mg/dL (ref 7.0–26.0)
CALCIUM: 8.5 mg/dL (ref 8.4–10.4)
CHLORIDE: 109 meq/L (ref 98–109)
CO2: 21 mEq/L — ABNORMAL LOW (ref 22–29)
CREATININE: 0.8 mg/dL (ref 0.6–1.1)
EGFR: 68 mL/min/{1.73_m2} — ABNORMAL LOW (ref >90)
Glucose: 119 mg/dl (ref 70–140)
Potassium: 4 mEq/L (ref 3.5–5.1)
Sodium: 138 mEq/L (ref 136–145)
TOTAL PROTEIN: 6.4 g/dL (ref 6.4–8.3)

## 2016-05-26 LAB — C DIFFICILE QUICK SCREEN W PCR REFLEX
C DIFFICILE (CDIFF) TOXIN: NEGATIVE
C DIFFICLE (CDIFF) ANTIGEN: NEGATIVE
C Diff interpretation: NEGATIVE

## 2016-05-26 LAB — CBC WITH DIFFERENTIAL/PLATELET
BASO%: 0 % (ref 0.0–2.0)
Basophils Absolute: 0 10*3/uL (ref 0.0–0.1)
EOS%: 0 % (ref 0.0–7.0)
Eosinophils Absolute: 0 10*3/uL (ref 0.0–0.5)
HEMATOCRIT: 34.7 % — AB (ref 34.8–46.6)
HEMOGLOBIN: 11.4 g/dL — AB (ref 11.6–15.9)
LYMPH#: 0.3 10*3/uL — AB (ref 0.9–3.3)
LYMPH%: 2.8 % — ABNORMAL LOW (ref 14.0–49.7)
MCH: 29 pg (ref 25.1–34.0)
MCHC: 32.9 g/dL (ref 31.5–36.0)
MCV: 88.3 fL (ref 79.5–101.0)
MONO#: 0.1 10*3/uL (ref 0.1–0.9)
MONO%: 1 % (ref 0.0–14.0)
NEUT#: 10.1 10*3/uL — ABNORMAL HIGH (ref 1.5–6.5)
NEUT%: 96.2 % — AB (ref 38.4–76.8)
PLATELETS: 153 10*3/uL (ref 145–400)
RBC: 3.93 10*6/uL (ref 3.70–5.45)
RDW: 15.1 % — ABNORMAL HIGH (ref 11.2–14.5)
WBC: 10.5 10*3/uL — ABNORMAL HIGH (ref 3.9–10.3)

## 2016-05-26 LAB — GASTROINTESTINAL PANEL BY PCR, STOOL (REPLACES STOOL CULTURE)
ASTROVIRUS: NOT DETECTED
Adenovirus F40/41: NOT DETECTED
CAMPYLOBACTER SPECIES: NOT DETECTED
CRYPTOSPORIDIUM: NOT DETECTED
CYCLOSPORA CAYETANENSIS: NOT DETECTED
E. COLI O157: NOT DETECTED
ENTAMOEBA HISTOLYTICA: NOT DETECTED
ENTEROAGGREGATIVE E COLI (EAEC): NOT DETECTED
Enteropathogenic E coli (EPEC): NOT DETECTED
Enterotoxigenic E coli (ETEC): NOT DETECTED
GIARDIA LAMBLIA: NOT DETECTED
NOROVIRUS GI/GII: NOT DETECTED
PLESIMONAS SHIGELLOIDES: NOT DETECTED
Rotavirus A: NOT DETECTED
SALMONELLA SPECIES: NOT DETECTED
SAPOVIRUS (I, II, IV, AND V): NOT DETECTED
SHIGA LIKE TOXIN PRODUCING E COLI (STEC): NOT DETECTED
Shigella/Enteroinvasive E coli (EIEC): NOT DETECTED
Vibrio cholerae: NOT DETECTED
Vibrio species: NOT DETECTED
YERSINIA ENTEROCOLITICA: NOT DETECTED

## 2016-05-26 MED ORDER — LACTINEX PO CHEW: 1.0000 | CHEWABLE_TABLET | Freq: Three times a day (TID) | ORAL | Status: DC

## 2016-05-26 MED ORDER — SODIUM CHLORIDE 0.9 % IJ SOLN
10.0000 mL | INTRAMUSCULAR | Status: AC | PRN
  Administered 2016-05-26: 10 mL
  Filled 2016-05-26: qty 10

## 2016-05-26 MED ORDER — HEPARIN SOD (PORK) LOCK FLUSH 100 UNIT/ML IV SOLN
500.0000 [IU] | INTRAVENOUS | Status: AC | PRN
  Administered 2016-05-26: 500 [IU]
  Filled 2016-05-26: qty 5

## 2016-05-26 NOTE — Patient Instructions (Signed)

## 2016-05-26 NOTE — Telephone Encounter (Signed)
Gave and printed appt sched and avs for pt for July  °

## 2016-05-26 NOTE — Progress Notes (Signed)
Pt in for labs and flush.  Pt PAC accessed and flushed without difficulty.  No blood return noted.  Pt labs drawn from the phlebotomist.

## 2016-05-29 NOTE — Progress Notes (Signed)
Cindy Byrd    HEMATOLOGY ONCOLOGY PROGRESS NOTE  Date of service: 05/26/2016   Patient Care Team: Tamsen Roers, MD as PCP - General (Family Medicine) [name], MD as Consulting Physician (Hematology and Oncology)  CHIEF COMPLAINTS/PURPOSE OF CONSULTATION: Follow-up for metastatic lung cancer  DIAGNOSIS:   #1 Metastatic non-small cell lung cancer with bilateral lung nodules and large metastatic lesion in the left Ilium. #2 Barrett's esophagus with some evidence of intramucosal adenocarcinoma of the esophagus. 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  Previous Treatment  1 Palliative radiation therapy to the large left ilium metastases 2. IV Nivolumab x 20ycles (on hold due to likely immune colitis) 3. Xgeva 1260mSC q4weeks for bone metastases.   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 She notes that her diarrhea is 95% better after prednisone. His having about 2-3 loose stools a day. C. difficile and stool cultures are negative. No abdominal pain or cramping. No mucositis or blood in the stools. Notes that she has had a tremendous appetite due to her prednisone and feels quite good. We have given her a referral to Dr. PyHilarie Fredricksono consider a colonoscopy given that she is still having some mild diarrhea and it is difficult to tease out from her baseline IBS. No fevers or chills. She notes that she is anxious being off treatment and wonders what the other options are. We discussed the options of trying to switch to Atezolizumab if her diarrhea resolves enough to get all steroids  completely. Alternatively we might pursue a wait and monitor policy or would need to consider chemotherapy treatment options since she does not have any of the targetable mutations. Continues to stay off cigarettes.  MEDICAL HISTORY:  Past Medical History  Diagnosis Date  . IBS (irritable bowel syndrome)   . Bone neoplasm 06/24/2015  . GERD (gastroesophageal reflux disease)   . Vitamin D deficiency disease   . Cigarette smoker two packs a day or less     Currently still smoking 2 PPD - Not interested in quitting at this time.  . Colon polyps     hyperplastic, tubular adenomas, tubulovillous adenoma  . Endometriosis     Hysterectomy with BSO at age [age]rs  . H/O: pneumonia   . Heavy smoker (more than 20 cigarettes per day) 06/24/2015  . Depression 06/24/2015  . Hypertension 06/24/2015    likely improved incidental to 40 lbs weight loss from her neoplasm. No Longer taking med for this as of 08-06-15  [facility]    metastatic poorly differentiated carcinoma. tumor left groin surgical removal with radiation tx.  . Cough, persistent     hx. lung cancer ? primary-being evaluated, unsure of primary site.  . Pain     left hip-persistent"tumor of bone"-radiation tx. 10.  . Swelling of ankle     bilateral  . Pulmonary nodules   . Barrett's esophagus   . Hiatal hernia   . Gastritis   . [place]ophageal [place]    intramucosal  . Diverticulosis   . Cataract     BILATERAL  . Emphysema of lung ([facility]  . Hyperlipidemia  SURGICAL HISTORY: Past Surgical History  Procedure Laterality Date  . Abdominal hysterectomy    . Tonsillectomy    . Ganglion cyst excision    . Total abdominal hysterectomy w/ bilateral salpingoophorectomy  at age 14 yrs    For endometriosis  . Knee arthroscopy  age about 1 yrs  . Bartholin gland cyst excision  75 yo ago    Does not want if it was an infected cyst or tumor. Was soon as delivery  . Colonoscopy w/ polypectomy      multiple times -  last done 09/2014 per patient.  . Esophagogastroduodenoscopy (egd) with propofol N/A 08/11/2015    Procedure: ESOPHAGOGASTRODUODENOSCOPY (EGD) WITH PROPOFOL;  Surgeon: [name], MD;  Location: [facility];  Service: Gastroenterology;  Laterality: N/A;    SOCIAL HISTORY: Social History   Social History  . Marital Status: Widowed    Spouse Name: N/A  . Number of Children: 2  . Years of Education: N/A   Occupational History  . Not on file.   Social History Main Topics  . Smoking status: Former Smoker -- 1.00 packs/day for 60 years    Types: Cigarettes    Quit date: 12/06/2015  . Smokeless tobacco: Never Used  . Alcohol Use: No  . Drug Use: No  . Sexual Activity: No   Other Topics Concern  . Not on file   Social History Narrative    FAMILY HISTORY: Family History  Problem Relation Age of Onset  . Stroke Mother   . Colon cancer Father   . Colon cancer Brother   . Colon cancer Brother   . Breast cancer Daughter 37    ER/PR+ stage II    ALLERGIES:  is allergic to penicillins; remeron; and latex. patient wonders if she has a penicillin allergy but notes that she is uncertain about this.  MEDICATIONS:  Current Outpatient Prescriptions  Medication Sig Dispense Refill  . amLODipine (NORVASC) 5 MG tablet TAKE 1 TABLET (5 MG TOTAL) BY MOUTH DAILY. 30 tablet 1  . artificial tears (LACRILUBE) OINT ophthalmic ointment Place into both eyes at bedtime. 1 Tube 1  . citalopram (CELEXA) 20 MG tablet Take 1 tablet (20 mg total) by mouth daily. 30 tablet 3  . docusate sodium (COLACE) 100 MG capsule Take 100 mg by mouth daily as needed for mild constipation. Reported on 02/23/2016    . Hypromellose (ARTIFICIAL TEARS) 0.4 % SOLN Apply 1 drop to eye QID. 1 Bottle 1  . lidocaine-prilocaine (EMLA) cream Apply small amount over port 1-2 hours prior to treatment, cover with plastic wrap (DO NOT RUB IN). 30 g prn  . LORazepam (ATIVAN) 1 MG tablet Take 1 tablet (1 mg total) by mouth every 8  (eight) hours as needed for anxiety (or nausea). 60 tablet 0  . NICOTINE STEP 1 21 MG/24HR patch PLACE 1 PATCH (21 MG TOTAL) ONTO THE SKIN DAILY. 28 patch 0  . omeprazole (PRILOSEC) 40 MG capsule TAKE ONE CAPSULE BY MOUTH EVERY DAY *NEEDS OFFICE VISIT FOR FURTHER REFILL* 30 capsule 5  . oxyCODONE-acetaminophen (PERCOCET/ROXICET) 5-325 MG tablet Take 1 tablet by mouth every 4 (four) hours as needed for severe pain. 60 tablet 0  . polycarbophil (FIBERCON) 625 MG tablet Take 2 tablets (1,250 mg total) by mouth 2 (two) times daily. 120 tablet 1  . potassium chloride 20 MEQ TBCR 78mq (2 times daily) PO three times a days for 1 day then 2 times daily x 3 days then once daily 30 tablet  0  . potassium chloride SA (K-DUR,KLOR-CON) 20 MEQ tablet 62mq 2 times daily x 4 days then 461m once daily 40 tablet 0  . predniSONE (DELTASONE) 20 MG tablet (with food)10041mo daily x 2 days then 54m84m daily x 7 days then 60mg5mdaily x 7 days then 40mg 67maily x 7 days then 20mg p37mily 100 tablet 0  . PRESCRIPTION MEDICATION Antibody Plan [facility]    . valsartan (DIOVAN) 80 MG tablet     . zolpidem (AMBIEN CR) 12.5 MG CR tablet Take 1 tablet (12.5 mg total) by mouth at bedtime as needed for sleep. 30 tablet 0  . lactobacillus acidophilus & bulgar (LACTINEX) chewable tablet Chew 1 tablet by mouth 3 (three) times daily with meals. 60 tablet 1   No current facility-administered medications for this visit.    REVIEW OF SYSTEMS:    10 point review of systems done and was noted to be negative except as noted above.  PHYSICAL EXAMINATION: ECOG PERFORMANCE STATUS: 2 - Symptomatic, <50% confined to bed  Filed Vitals:   05/26/16 1553  BP: 158/67  Pulse: 71  Temp: 98.5 F (36.9 C)  Resp: 16   Filed Weights   05/26/16 1553  Weight: 140 lb 1.6 oz (63.549 kg)  . Wt Readings from Last 3 Encounters:  05/26/16 140 lb 1.6 oz (63.549 kg)  05/19/16 139 lb 8 oz (63.277 kg)  04/21/16 145 lb (65.772 kg)    GENERAL:alert, NAD, appears well. SKIN: skin color, texture, turgor are normal, no rashes or significant lesions EYES: normal, conjunctiva are pink and non-injected, sclera clear OROPHARYNX:no exudate, no erythema and lips, buccal mucosa, and tongue normal  NECK: supple, thyroid normal size, non-tender, without nodularity LYMPH:  no palpable lymphadenopathy in the cervical, axillary or inguinal LUNGS: clear to auscultation and bilateral reduced breath sounds with normal respiratory effort  HEART: regular rate & rhythm and no murmurs and resolved lower extremity edema ABDOMEN:abdomen soft, non-tender and normal bowel sounds Musculoskeletal: No pedal edema. No calf pain or tenderness.  PSYCH: alert & oriented x 3 with fluent speech NEURO: no focal motor/sensory deficits.  LABORATORY DATA:  I have reviewed the data as listed . CBC Latest Ref Rng 05/26/2016 05/19/2016 05/09/2016  WBC 3.9 - 10.3 10e3/uL 10.5(H) 14.5(H) 12.0(H)  Hemoglobin 11.6 - 15.9 g/dL 11.4(L) 11.7 11.9  Hematocrit 34.8 - 46.6 % 34.7(L) 35.7 35.3  Platelets 145 - 400 10e3/uL 153 207 330   . CMP Latest Ref Rng 05/26/2016 05/19/2016 05/09/2016  Glucose 70 - 140 mg/dl 119 81 103  BUN 7.0 - 26.0 mg/dL 23.2 20.4 17.8  Creatinine 0.6 - 1.1 mg/dL 0.8 0.8 0.9  Sodium 136 - 145 mEq/L 138 141 139  Potassium 3.5 - 5.1 mEq/L 4.0 2.9(LL) 3.5  CO2 22 - 29 mEq/L 21(L) 26 24  Calcium 8.4 - 10.4 mg/dL 8.5 9.3 9.3  Total Protein 6.4 - 8.3 g/dL 6.4 6.5 7.7  Total Bilirubin 0.20 - 1.20 mg/dL 0.53 0.43 0.36  Alkaline Phos 40 - 150 U/L 65 66 70  AST 5 - 34 U/L 15 11 13   ALT 0 - 55 U/L 15 9 <9     RADIOGRAPHIC STUDIES: I have personally reviewed the radiological images as listed and agreed with the findings in the report. No results found.  ASSESSMENT & PLAN:   #1 Metastatic poorly differentiated carcinoma with likely lung primary [non-small cell lung cancer].  CT of the head with and without contrast showed no evidence of  metastatic disease. Patient notes much improved pain control. EGFR blood test mutation analysis negative. Patient's pain is much better controlled after radiation for the painful ilium met. CT chest abdomen pelvis 04/19/2016 shows no evidence of disease progression. Patient tolerated Nivolumab very well until recently when she developed grade 2 Immune colitis as a result of which Nivolumab has been on hold.  #2 diarrhea- grade 2 likely related to immune colitis from her Nivolumab. This is significantly improving with prednisone at this time  #3 hypokalemia due to diarrhea -Resolved  Plan -GI symptoms are much improved with prednisone. She'll continue prednisone taper. -No indication for additional dose escalation of steroids at this time or adding infliximab. C. difficile and stool studies sent and are negative. -We'll refer the patient to Dr. [name] to consider colonoscopy to evaluate her colitis since she also has some irritable bowel syndrome and baseline and it is difficult to differentiated with this time. It would be important to know with regards to the timing of her next treatment. -GI prophylaxis while on his dose steroids -Continue to hold Nivolumab at this time. -Hold all laxatives . --We'll tentatively plan to consider Atezolizumab instead of Nivolumab if we can get her off steroids .  #2 Barrett's esophagus 4cms in the distal esophagus with low and high-grade dysplasia cannot rule out an early intra-mucosal esophageal adenocarcinoma. Plan -Patient being monitored by Dr. [name] from GI. -Patient was seen at [facility] for further management and saw Dr.Kochar, Arie Sabina, MD and Dr Adria Devon. She notes that she is not keen to go back to [facility] for this .   Return to care with Dr. Irene Limbo in 2 week with CBC, CMP to monitor for resolution of diarrhea on steroids . Treatment on hold at this time. GI referral to Dr [name] for possible colonoscopy.  . Orders Placed This Encounter   Procedures  . Ambulatory referral to Gastroenterology    Referral Priority:  Urgent    Referral Type:  Consultation    Referral Reason:  Specialty Services Required    Referred to Provider:  [name], MD    Number of Visits Requested:  1    All questions were answered. The patient knows to call the clinic with any problems, questions or concerns.  Sullivan Lone MD [place] Hematology/Oncology Physician [facility]  (Office): [phone] (Work cell): [phone] (Fax): [phone]

## 2016-05-30 ENCOUNTER — Telehealth: Payer: Self-pay | Admitting: *Deleted

## 2016-05-30 DIAGNOSIS — R197 Diarrhea, unspecified: Secondary | ICD-10-CM

## 2016-05-30 NOTE — Telephone Encounter (Signed)
===  View-only below this line===  ----- Message -----    From: [name], MD    Sent: 05/30/2016   8:51 AM      To: Larina Bras, CMA  See note from Dr. Irene Limbo Pt needs colon  JMP  ----- Message -----    From: [name], MD    Sent: 05/29/2016  11:50 PM      To: [name], MD  Thanks. Patient has developed likely immune colitis due to Nivolumab which is currently on hold. This is difficult to differentiate from her baseline IBS-D. I have referred her back to Dr [name] for consideration of a colonoscopy to document etiology of colitis since this would be important for a standpoint of determine further oncologic treatments.  Thanks, Sullivan Lone MD [place] AAHIVMS [facility] Hematology/Oncology Physician [place]  (Office):       [phone] (Work cell):  [phone] (Fax):           [phone]

## 2016-05-30 NOTE — Telephone Encounter (Signed)
I have spoken to patient who has scheduled colonoscopy on 06/15/16 @ 1:30 pm. She will be around [place] on 06/09/16 so she would like to get instructions that day. We have no open previsit spots at this time for that date, therefore, I will give her instructions. She verbalizes understanding.

## 2016-06-06 ENCOUNTER — Encounter: Payer: Self-pay | Admitting: Internal Medicine

## 2016-06-09 ENCOUNTER — Other Ambulatory Visit: Payer: Self-pay | Admitting: *Deleted

## 2016-06-09 ENCOUNTER — Telehealth: Payer: Self-pay | Admitting: Hematology

## 2016-06-09 ENCOUNTER — Encounter: Payer: Self-pay | Admitting: Hematology

## 2016-06-09 ENCOUNTER — Other Ambulatory Visit (HOSPITAL_BASED_OUTPATIENT_CLINIC_OR_DEPARTMENT_OTHER): Payer: Medicare Other

## 2016-06-09 ENCOUNTER — Ambulatory Visit (HOSPITAL_BASED_OUTPATIENT_CLINIC_OR_DEPARTMENT_OTHER): Payer: Medicare Other | Admitting: Hematology

## 2016-06-09 VITALS — BP 137/61 | HR 72 | Temp 98.4°F | Resp 16 | Wt 145.0 lb

## 2016-06-09 DIAGNOSIS — G893 Neoplasm related pain (acute) (chronic): Secondary | ICD-10-CM

## 2016-06-09 DIAGNOSIS — Z95828 Presence of other vascular implants and grafts: Secondary | ICD-10-CM

## 2016-06-09 DIAGNOSIS — C3490 Malignant neoplasm of unspecified part of unspecified bronchus or lung: Secondary | ICD-10-CM

## 2016-06-09 DIAGNOSIS — C3492 Malignant neoplasm of unspecified part of left bronchus or lung: Secondary | ICD-10-CM | POA: Diagnosis not present

## 2016-06-09 DIAGNOSIS — R197 Diarrhea, unspecified: Secondary | ICD-10-CM | POA: Diagnosis not present

## 2016-06-09 DIAGNOSIS — K22711 Barrett's esophagus with high grade dysplasia: Secondary | ICD-10-CM | POA: Diagnosis not present

## 2016-06-09 DIAGNOSIS — K22710 Barrett's esophagus with low grade dysplasia: Secondary | ICD-10-CM

## 2016-06-09 LAB — CBC WITH DIFFERENTIAL/PLATELET
BASO%: 0.3 % (ref 0.0–2.0)
Basophils Absolute: 0 10*3/uL (ref 0.0–0.1)
EOS%: 0.1 % (ref 0.0–7.0)
Eosinophils Absolute: 0 10*3/uL (ref 0.0–0.5)
HEMATOCRIT: 37.2 % (ref 34.8–46.6)
HEMOGLOBIN: 12 g/dL (ref 11.6–15.9)
LYMPH#: 0.5 10*3/uL — AB (ref 0.9–3.3)
LYMPH%: 4.9 % — ABNORMAL LOW (ref 14.0–49.7)
MCH: 28.6 pg (ref 25.1–34.0)
MCHC: 32.2 g/dL (ref 31.5–36.0)
MCV: 89 fL (ref 79.5–101.0)
MONO#: 0.2 10*3/uL (ref 0.1–0.9)
MONO%: 2.4 % (ref 0.0–14.0)
NEUT#: 8.6 10*3/uL — ABNORMAL HIGH (ref 1.5–6.5)
NEUT%: 92.3 % — ABNORMAL HIGH (ref 38.4–76.8)
Platelets: 184 10*3/uL (ref 145–400)
RBC: 4.18 10*6/uL (ref 3.70–5.45)
RDW: 15.7 % — ABNORMAL HIGH (ref 11.2–14.5)
WBC: 9.3 10*3/uL (ref 3.9–10.3)

## 2016-06-09 LAB — COMPREHENSIVE METABOLIC PANEL
ALBUMIN: 3.1 g/dL — AB (ref 3.5–5.0)
ALT: 20 U/L (ref 0–55)
AST: 17 U/L (ref 5–34)
Alkaline Phosphatase: 73 U/L (ref 40–150)
Anion Gap: 9 mEq/L (ref 3–11)
BUN: 22.9 mg/dL (ref 7.0–26.0)
CALCIUM: 8.7 mg/dL (ref 8.4–10.4)
CHLORIDE: 107 meq/L (ref 98–109)
CO2: 22 mEq/L (ref 22–29)
CREATININE: 0.9 mg/dL (ref 0.6–1.1)
EGFR: 65 mL/min/{1.73_m2} — ABNORMAL LOW (ref >90)
GLUCOSE: 127 mg/dL (ref 70–140)
Potassium: 4.1 mEq/L (ref 3.5–5.1)
Sodium: 139 mEq/L (ref 136–145)
Total Bilirubin: 0.4 mg/dL (ref 0.20–1.20)
Total Protein: 6.4 g/dL (ref 6.4–8.3)

## 2016-06-09 MED ORDER — HEPARIN SOD (PORK) LOCK FLUSH 100 UNIT/ML IV SOLN
500.0000 [IU] | INTRAVENOUS | Status: AC | PRN
  Administered 2016-06-09: 500 [IU]
  Filled 2016-06-09: qty 5

## 2016-06-09 MED ORDER — SODIUM CHLORIDE 0.9 % IJ SOLN
10.0000 mL | INTRAMUSCULAR | Status: AC | PRN
  Administered 2016-06-09: 10 mL
  Filled 2016-06-09: qty 10

## 2016-06-09 MED ORDER — NA SULFATE-K SULFATE-MG SULF 17.5-3.13-1.6 GM/177ML PO SOLN: ORAL | Status: DC

## 2016-06-09 MED ORDER — PREDNISONE 20 MG PO TABS: 20.0000 mg | ORAL_TABLET | Freq: Every day | ORAL | Status: DC

## 2016-06-09 NOTE — Progress Notes (Signed)
Cindy Byrd    HEMATOLOGY ONCOLOGY PROGRESS NOTE  Date of service: .06/09/2016  Patient Care Team: Tamsen Roers, MD as PCP - General (Family Medicine) [name], MD as Consulting Physician (Hematology and Oncology)  CHIEF COMPLAINTS/PURPOSE OF CONSULTATION: Follow-up for metastatic lung cancer  DIAGNOSIS:   #1 Metastatic non-small cell lung cancer with bilateral lung nodules and large metastatic lesion in the left Ilium. #2 Barrett's esophagus with some evidence of intramucosal adenocarcinoma of the esophagus. 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  Previous Treatment  1 Palliative radiation therapy to the large left ilium metastases 2. IV Nivolumab x 20ycles (on hold due to likely immune colitis) 3. Xgeva 1228mSC q4weeks for bone metastases.   HISTORY OF PRESENTING ILLNESS: (plz see my previous consultation for details of initial presentation)  INTERVAL HISTORY  Cindy Byrd here for her scheduled 2 week followup. She notes he diarrhea is much improved and baseline to her pre-treatment baseline of 3-4 semi loose stools daily. She has been scheduled for a colonoscopy with Dr PyHilarie Fredricksonn 06/15/2016. Down to 2064mn the prednisone. Notes excellent energy levels and has been very active. Sleeping well. No other acute new symptoms. Breathing stable. Good appetite due to the steroids.  MEDICAL HISTORY:  Past Medical History  Diagnosis Date  . IBS (irritable bowel syndrome)   . Bone neoplasm 06/24/2015  . GERD (gastroesophageal reflux disease)   . Vitamin D deficiency disease   . Cigarette smoker two packs a day or less     Currently still smoking 2 PPD - Not interested in quitting at this time.  . Colon polyps     hyperplastic, tubular  adenomas, tubulovillous adenoma  . Endometriosis     Hysterectomy with BSO at age 75 35s  . H/O: pneumonia   . Heavy smoker (more than 20 cigarettes per day) 06/24/2015  . Depression 06/24/2015  . Hypertension 06/24/2015    likely improved incidental to 40 lbs weight loss from her neoplasm. No Longer taking med for this as of 08-06-15  . [facility]   metastatic poorly differentiated carcinoma. tumor left groin surgical removal with radiation tx.  . Cough, persistent     hx. lung cancer ? primary-being evaluated, unsure of primary site.  . Pain     left hip-persistent"tumor of bone"-radiation tx. 10.  . Swelling of ankle     bilateral  . Pulmonary nodules   . Barrett's esophagus   . Hiatal hernia   . Gastritis   . Esophageal adenocarcinoma ([facility]/6[facility]16    intramucosal  . Diverticulosis   . Cataract     BILATERAL  . Emphysema of lung ([facility] . Hyperlipidemia    SURGICAL HISTORY: Past Surgical History  Procedure Laterality Date  . Abdominal hysterectomy    . Tonsillectomy    . Ganglion cyst excision    . Total abdominal hysterectomy w/ bilateral salpingoophorectomy  at age 75 3s    For endometriosis  . Knee arthroscopy  age about 75 68s  . Bartholin gland cyst excision  52 57 ago    Does not want if it was an infected cyst or tumor. Was soon as delivery  . Colonoscopy w/ polypectomy      multiple times - last done 09/2014 per  patient.  . Esophagogastroduodenoscopy (egd) with propofol N/A 08/11/2015    Procedure: ESOPHAGOGASTRODUODENOSCOPY (EGD) WITH PROPOFOL;  Surgeon: [name], MD;  Location: [facility];  Service: Gastroenterology;  Laterality: N/A;    SOCIAL HISTORY: Social History   Social History  . Marital Status: Widowed    Spouse Name: N/A  . Number of Children: 2  . Years of Education: N/A   Occupational History  . Not on file.   Social History Main Topics  . Smoking status: Former Smoker -- 1.00 packs/day for 60 years    Types: Cigarettes    Quit  date: 12/06/2015  . Smokeless tobacco: Never Used  . Alcohol Use: No  . Drug Use: No  . Sexual Activity: No   Other Topics Concern  . Not on file   Social History Narrative    FAMILY HISTORY: Family History  Problem Relation Age of Onset  . Stroke Mother   . Colon cancer Father   . Colon cancer Brother   . Colon cancer Brother   . Breast cancer Daughter 80    ER/PR+ stage II    ALLERGIES:  is allergic to penicillins; remeron; and latex. patient wonders if she has a penicillin allergy but notes that she is uncertain about this.  MEDICATIONS:  Current Outpatient Prescriptions  Medication Sig Dispense Refill  . amLODipine (NORVASC) 5 MG tablet TAKE 1 TABLET (5 MG TOTAL) BY MOUTH DAILY. 30 tablet 1  . artificial tears (LACRILUBE) OINT ophthalmic ointment Place into both eyes at bedtime. 1 Tube 1  . citalopram (CELEXA) 20 MG tablet Take 1 tablet (20 mg total) by mouth daily. 30 tablet 3  . docusate sodium (COLACE) 100 MG capsule Take 100 mg by mouth daily as needed for mild constipation. Reported on 02/23/2016    . Hypromellose (ARTIFICIAL TEARS) 0.4 % SOLN Apply 1 drop to eye QID. 1 Bottle 1  . lactobacillus acidophilus & bulgar (LACTINEX) chewable tablet Chew 1 tablet by mouth 3 (three) times daily with meals. 60 tablet 1  . lidocaine-prilocaine (EMLA) cream Apply small amount over port 1-2 hours prior to treatment, cover with plastic wrap (DO NOT RUB IN). 30 g prn  . LORazepam (ATIVAN) 1 MG tablet Take 1 tablet (1 mg total) by mouth every 8 (eight) hours as needed for anxiety (or nausea). 60 tablet 0  . Na Sulfate-K Sulfate-Mg Sulf 17.5-3.13-1.6 GM/180ML SOLN Suprep-Use as directed 354 mL 0  . NICOTINE STEP 1 21 MG/24HR patch PLACE 1 PATCH (21 MG TOTAL) ONTO THE SKIN DAILY. 28 patch 0  . omeprazole (PRILOSEC) 40 MG capsule TAKE ONE CAPSULE BY MOUTH EVERY DAY *NEEDS OFFICE VISIT FOR FURTHER REFILL* 30 capsule 5  . oxyCODONE-acetaminophen (PERCOCET/ROXICET) 5-325 MG tablet Take 1  tablet by mouth every 4 (four) hours as needed for severe pain. 60 tablet 0  . polycarbophil (FIBERCON) 625 MG tablet Take 2 tablets (1,250 mg total) by mouth 2 (two) times daily. 120 tablet 1  . potassium chloride 20 MEQ TBCR 64mq (2 times daily) PO three times a days for 1 day then 2 times daily x 3 days then once daily 30 tablet 0  . potassium chloride SA (K-DUR,KLOR-CON) 20 MEQ tablet 462m 2 times daily x 4 days then 4055monce daily 40 tablet 0  . predniSONE (DELTASONE) 20 MG tablet Take 1 tablet (20 mg total) by mouth daily with breakfast. 20 tablet 0  . PRESCRIPTION MEDICATION Antibody Plan [facility]    . valsartan (DIOVAN) 80 MG tablet     .  zolpidem (AMBIEN CR) 12.5 MG CR tablet Take 1 tablet (12.5 mg total) by mouth at bedtime as needed for sleep. 30 tablet 0   No current facility-administered medications for this visit.    REVIEW OF SYSTEMS:    10 point review of systems done and was noted to be negative except as noted above.  PHYSICAL EXAMINATION: ECOG PERFORMANCE STATUS: 2 - Symptomatic, <50% confined to bed  Filed Vitals:   06/09/16 1507  BP: 137/61  Pulse: 72  Temp: 98.4 F (36.9 C)  Resp: 16   Filed Weights   06/09/16 1507  Weight: 145 lb (65.772 kg)  . Wt Readings from Last 3 Encounters:  06/09/16 145 lb (65.772 kg)  05/26/16 140 lb 1.6 oz (63.549 kg)  05/19/16 139 lb 8 oz (63.277 kg)   GENERAL:alert, NAD, appears well. SKIN: skin color, texture, turgor are normal, no rashes or significant lesions EYES: normal, conjunctiva are pink and non-injected, sclera clear OROPHARYNX:no exudate, no erythema and lips, buccal mucosa, and tongue normal  NECK: supple, thyroid normal size, non-tender, without nodularity LYMPH:  no palpable lymphadenopathy in the cervical, axillary or inguinal LUNGS: clear to auscultation and bilateral reduced breath sounds with normal respiratory effort  HEART: regular rate & rhythm and no murmurs and resolved lower extremity  edema ABDOMEN:abdomen soft, non-tender and normal bowel sounds Musculoskeletal: No pedal edema. No calf pain or tenderness.  PSYCH: alert & oriented x 3 with fluent speech NEURO: no focal motor/sensory deficits.  LABORATORY DATA:  I have reviewed the data as listed . CBC Latest Ref Rng 06/09/2016 05/26/2016 05/19/2016  WBC 3.9 - 10.3 10e3/uL 9.3 10.5(H) 14.5(H)  Hemoglobin 11.6 - 15.9 g/dL 12.0 11.4(L) 11.7  Hematocrit 34.8 - 46.6 % 37.2 34.7(L) 35.7  Platelets 145 - 400 10e3/uL 184 153 207   . CMP Latest Ref Rng 06/09/2016 05/26/2016 05/19/2016  Glucose 70 - 140 mg/dl 127 119 81  BUN 7.0 - 26.0 mg/dL 22.9 23.2 20.4  Creatinine 0.6 - 1.1 mg/dL 0.9 0.8 0.8  Sodium 136 - 145 mEq/L 139 138 141  Potassium 3.5 - 5.1 mEq/L 4.1 4.0 2.9(LL)  CO2 22 - 29 mEq/L 22 21(L) 26  Calcium 8.4 - 10.4 mg/dL 8.7 8.5 9.3  Total Protein 6.4 - 8.3 g/dL 6.4 6.4 6.5  Total Bilirubin 0.20 - 1.20 mg/dL 0.40 0.53 0.43  Alkaline Phos 40 - 150 U/L 73 65 66  AST 5 - 34 U/L 17 15 11   ALT 0 - 55 U/L 20 15 9      RADIOGRAPHIC STUDIES: I have personally reviewed the radiological images as listed and agreed with the findings in the report. No results found.  ASSESSMENT & PLAN:   #1 Metastatic poorly differentiated carcinoma with likely lung primary [non-small cell lung cancer].  CT of the head with and without contrast showed no evidence of metastatic disease. Patient notes much improved pain control. EGFR blood test mutation analysis negative. Patient's pain is much better controlled after radiation for the painful ilium met. CT chest abdomen pelvis 04/19/2016 shows no evidence of disease progression. Patient tolerated Nivolumab very well until recently when she developed grade 2 Immune colitis as a result of which Nivolumab has been discontinued. #2 diarrhea- grade 2 likely related to immune colitis from her Nivolumab. This is significantly improvied with prednisone at this time. She notes it is back to her  baseline of 3-4 semi-loose stools per day which is what it was pre-treatment. GI panel and c diff neg.  #3 hypokalemia  due to diarrhea -Resolved  Plan -followup with Dr [name] for colonoscopy as per plan on 06/15/2016 -will continue prednisone 37m po daily currently and taper off based on colonoscopic findings. -if bowel status stable and no significant findings on colonoscopy and able to get off steriods could consider Atezolizumab rx. -GI prophylaxis while on steroids --using loperamide prn. --will rpt CT C/A/P (2 months from previous) to check disease status from a treatment planning standpoint. IF stable disease patient would have the option to wait and watch (since goal of treatment is palliative). Alternative would consider Atezolizumab as noted above. -continue Xgeva q4weeks  #2 Barrett's esophagus 4cms in the distal esophagus with low and high-grade dysplasia cannot rule out an early intra-mucosal esophageal adenocarcinoma. Plan -Patient being monitored by Dr. PHilarie Fredricksonfrom GI. -[facility] Dr.Kochar, BArie Sabina MD and Dr SAdria Devon   Return to care with Dr. KIrene Limboin 2 week with CBC, CMP colonoscopy and CT chest/abd/pelvis.   . Orders Placed This Encounter  Procedures  . CT CHEST W CONTRAST    Standing Status: Future     Number of Occurrences:      Standing Expiration Date: 07/14/2017    Order Specific Question:  Reason for exam:    Answer:  re-evaluation for treatment planning for metastatic lung cancer    Order Specific Question:  [facility]    [facility]    Standing Status: Future     Number of Occurrences:      Standing Expiration Date: 09/09/2017    Order Specific Question:  Reason for exam:    Answer:  re-evaluation for treatment planning for metastatic lung cancer    Order Specific Question:  [facility]    [facility]  . CBC & Diff and Retic    Standing Status: Future     Number of Occurrences:      Standing Expiration Date: 06/09/2017  . Comprehensive metabolic panel    Standing Status: Future     Number of Occurrences:      Standing Expiration Date: 06/09/2017  . SCHEDULING COMMUNICATION    Schedule 15 minute injection appointment    All questions were answered. The patient knows to call the clinic with any problems, questions or concerns.  GSullivan LoneMD MWyomingHematology/Oncology Physician [facility] (Office): [phone](Work cell): [phone](Fax): [phone]

## 2016-06-09 NOTE — Addendum Note (Signed)
Addended by: Larina Bras on: 06/09/2016 08:41 AM   Modules accepted: Orders

## 2016-06-09 NOTE — Telephone Encounter (Signed)
per pof to sch pt appt-adv pt that [facility] will call to sch scan-gae contrast-pt req to wait over weekend b4 getting results

## 2016-06-12 ENCOUNTER — Other Ambulatory Visit: Payer: Self-pay | Admitting: Hematology

## 2016-06-14 ENCOUNTER — Other Ambulatory Visit: Payer: Self-pay | Admitting: Hematology

## 2016-06-15 ENCOUNTER — Other Ambulatory Visit: Payer: Self-pay | Admitting: Hematology

## 2016-06-15 ENCOUNTER — Ambulatory Visit (AMBULATORY_SURGERY_CENTER): Payer: Medicare Other | Admitting: Internal Medicine

## 2016-06-15 ENCOUNTER — Encounter: Payer: Self-pay | Admitting: Internal Medicine

## 2016-06-15 VITALS — BP 135/83 | HR 72 | Temp 97.7°F | Resp 12 | Ht 67.0 in | Wt 145.0 lb

## 2016-06-15 DIAGNOSIS — K5190 Ulcerative colitis, unspecified, without complications: Secondary | ICD-10-CM | POA: Diagnosis not present

## 2016-06-15 DIAGNOSIS — R197 Diarrhea, unspecified: Secondary | ICD-10-CM | POA: Diagnosis not present

## 2016-06-15 MED ORDER — CLOTRIMAZOLE-BETAMETHASONE 1-0.05 % EX CREA: 1.0000 "application " | TOPICAL_CREAM | Freq: Two times a day (BID) | CUTANEOUS | Status: DC

## 2016-06-15 MED ORDER — SODIUM CHLORIDE 0.9 % IV SOLN: 500.0000 mL | INTRAVENOUS | Status: DC

## 2016-06-15 MED ORDER — MESALAMINE 1.2 G PO TBEC: 2.4000 g | DELAYED_RELEASE_TABLET | Freq: Every day | ORAL | Status: DC

## 2016-06-15 NOTE — Patient Instructions (Signed)
Biopsies taken today. Handouts given on diverticulosis, and hemorrhoids.  Pick up new medications from your pharmacy. Take as directed. Call [facility] as directed by Dr.Pyrtle.    YOU HAD AN ENDOSCOPIC PROCEDURE TODAY AT [facility]:   Refer to the procedure report that was given to you for any specific questions about what was found during the examination.  If the procedure report does not answer your questions, please call your gastroenterologist to clarify.  If you requested that your care partner not be given the details of your procedure findings, then the procedure report has been included in a sealed envelope for you to review at your convenience later.  YOU SHOULD EXPECT: Some feelings of bloating in the abdomen. Passage of more gas than usual.  Walking can help get rid of the air that was put into your GI tract during the procedure and reduce the bloating. If you had a lower endoscopy (such as a colonoscopy or flexible sigmoidoscopy) you may notice spotting of blood in your stool or on the toilet paper. If you underwent a bowel prep for your procedure, you may not have a normal bowel movement for a few days.  Please Note:  You might notice some irritation and congestion in your nose or some drainage.  This is from the oxygen used during your procedure.  There is no need for concern and it should clear up in a day or so.  SYMPTOMS TO REPORT IMMEDIATELY:   Following lower endoscopy (colonoscopy or flexible sigmoidoscopy):  Excessive amounts of blood in the stool  Significant tenderness or worsening of abdominal pains  Swelling of the abdomen that is new, acute  Fever of 100F or higher   For urgent or emergent issues, a gastroenterologist can be reached at any hour by calling [phone].   DIET: Your first meal following the procedure should be a small meal and then it is ok to progress to your normal diet. Heavy or fried foods are harder to digest and may make you  feel nauseous or bloated.  Likewise, meals heavy in dairy and vegetables can increase bloating.  Drink plenty of fluids but you should avoid alcoholic beverages for 24 hours.  ACTIVITY:  You should plan to take it easy for the rest of today and you should NOT DRIVE or use heavy machinery until tomorrow (because of the sedation medicines used during the test).    FOLLOW UP: Our staff will call the number listed on your records the next business day following your procedure to check on you and address any questions or concerns that you may have regarding the information given to you following your procedure. If we do not reach you, we will leave a message.  However, if you are feeling well and you are not experiencing any problems, there is no need to return our call.  We will assume that you have returned to your regular daily activities without incident.  If any biopsies were taken you will be contacted by phone or by letter within the next 1-3 weeks.  Please call us at [phone] if you have not heard about the biopsies in 3 weeks.    SIGNATURES/CONFIDENTIALITY: You and/or your care partner have signed paperwork which will be entered into your electronic medical record.  These signatures attest to the fact that that the information above on your After Visit Summary has been reviewed and is understood.  Full responsibility of the confidentiality of this discharge information lies  with you and/or your care-partner.

## 2016-06-15 NOTE — Progress Notes (Signed)
To recovery, report to Myetrs, RN, VSS 

## 2016-06-15 NOTE — Progress Notes (Signed)
Called to room to assist during endoscopic procedure.  Patient ID and intended procedure confirmed with present staff. Received instructions for my participation in the procedure from the performing physician.  

## 2016-06-15 NOTE — Op Note (Signed)
[facility] Patient Name: Cindy Byrd Procedure Date: 06/15/2016 1:22 PM MRN: [id number] Endoscopist: [name] , MD Age: 75 Referring MD:  Date of Birth: 05-21-1941 Gender: Female Account #: [id number] Procedure:                Colonoscopy Indications:              Diarrhea (secondary to noninfectious colitis) felt                            induced by chemotherapeutic agent, Nivolumab Medicines:                Monitored Anesthesia Care Procedure:                Pre-Anesthesia Assessment:                           - Prior to the procedure, a History and Physical                            was performed, and patient medications and                            allergies were reviewed. The patient's tolerance of                            previous anesthesia was also reviewed. The risks                            and benefits of the procedure and the sedation                            options and risks were discussed with the patient.                            All questions were answered, and informed consent                            was obtained. Prior Anticoagulants: The patient has                            taken no previous anticoagulant or antiplatelet                            agents. ASA Grade Assessment: III - A patient with                            severe systemic disease. After reviewing the risks                            and benefits, the patient was deemed in                            satisfactory condition to undergo the procedure.  After obtaining informed consent, the colonoscope                            was passed under direct vision. Throughout the                            procedure, the patient's blood pressure, pulse, and                            oxygen saturations were monitored continuously. The                            Model PCF-H190L [id number]) scope was introduced                            through the  anus and advanced to the the cecum,                            identified by appendiceal orifice and ileocecal                            valve. The colonoscopy was performed without                            difficulty. The patient tolerated the procedure                            well. The quality of the bowel preparation was                            adequate. The ileocecal valve, appendiceal orifice,                            and rectum were photographed. Scope In: 1:46:57 PM Scope Out: 2:06:56 PM Scope Withdrawal Time: 0 hours 12 minutes 49 seconds  Total Procedure Duration: 0 hours 19 minutes 59 seconds  Findings:                 The perianal exam findings include hemorrhoids, a                            perianal rash and skin tags.                           Patchy mild inflammation characterized by erosions                            and erythema was found in the entire colon. This is                            most promient in the rectum and sigmoid colon.                            Multiple biopsies were obtained with cold forceps  for histology in a targeted manner from the right                            and left colon including rectum.                           Multiple small and large-mouthed diverticula were                            found in the recto-sigmoid colon, sigmoid colon and                            descending colon.                           Retroflexion in the rectum was not performed due to                            narrowed rectal vault. Complications:            No immediate complications. Estimated Blood Loss:     Estimated blood loss was minimal. Impression:               - Hemorrhoids, perianal rash and perianal skin tags                            found on perianal exam.                           - Patchy mild inflammation was found in the entire                            examined colon, rule out inflammatory bowel  disease.                           - Moderate diverticulosis in the recto-sigmoid                            colon, in the sigmoid colon and in the descending                            colon.                           - Multiple biopsies were obtained from the right                            and left colon including rectum. Recommendation:           - Patient has a contact number available for                            emergencies. The signs and symptoms of potential                            delayed complications were discussed with  the                            patient. Return to normal activities tomorrow.                            Written discharge instructions were provided to the                            patient.                           - Resume previous diet.                           - Continue present medications.                           - Await pathology results.                           - No recommendation at this time regarding repeat                            colonoscopy.                           - Trial of Lialda 2.4 g daily                           - Lotrisone cream applied BID to perianal skin.                           - Call [facility] to schedule EGD with Dr. Adria Devon as                            recommended at recent office visit there. [name], MD 06/15/2016 2:22:44 PM This report has been signed electronically. CC Letter to:             Dr. Irene Limbo

## 2016-06-16 ENCOUNTER — Telehealth: Payer: Self-pay

## 2016-06-16 NOTE — Telephone Encounter (Signed)
  Follow up Call-  Call back number 06/15/2016 02/23/2016  Post procedure Call Back phone  # [phone]  Permission to leave phone message Yes Yes    Patient was called for follow up after her procedure on 06/15/2016. No answer at the number given for follow up phone call. A message was left on the answering machine.

## 2016-06-22 LAB — BASIC METABOLIC PANEL: POTASSIUM: 2.7 — AB (ref 3.4–5.3)

## 2016-06-23 ENCOUNTER — Ambulatory Visit (HOSPITAL_BASED_OUTPATIENT_CLINIC_OR_DEPARTMENT_OTHER): Payer: Medicare Other

## 2016-06-23 ENCOUNTER — Other Ambulatory Visit (HOSPITAL_BASED_OUTPATIENT_CLINIC_OR_DEPARTMENT_OTHER): Payer: Medicare Other

## 2016-06-23 DIAGNOSIS — C3490 Malignant neoplasm of unspecified part of unspecified bronchus or lung: Secondary | ICD-10-CM

## 2016-06-23 DIAGNOSIS — Z452 Encounter for adjustment and management of vascular access device: Secondary | ICD-10-CM | POA: Diagnosis not present

## 2016-06-23 DIAGNOSIS — Z95828 Presence of other vascular implants and grafts: Secondary | ICD-10-CM

## 2016-06-23 LAB — COMPREHENSIVE METABOLIC PANEL
ALBUMIN: 3 g/dL — AB (ref 3.5–5.0)
ALT: 18 U/L (ref 0–55)
ANION GAP: 10 meq/L (ref 3–11)
AST: 23 U/L (ref 5–34)
Alkaline Phosphatase: 81 U/L (ref 40–150)
BILIRUBIN TOTAL: 0.49 mg/dL (ref 0.20–1.20)
BUN: 14.7 mg/dL (ref 7.0–26.0)
CALCIUM: 8.1 mg/dL — AB (ref 8.4–10.4)
CHLORIDE: 105 meq/L (ref 98–109)
CO2: 29 mEq/L (ref 22–29)
CREATININE: 0.9 mg/dL (ref 0.6–1.1)
EGFR: 65 mL/min/{1.73_m2} — ABNORMAL LOW (ref >90)
Glucose: 117 mg/dl (ref 70–140)
Potassium: 2.8 mEq/L — CL (ref 3.5–5.1)
Sodium: 144 mEq/L (ref 136–145)
TOTAL PROTEIN: 6.5 g/dL (ref 6.4–8.3)

## 2016-06-23 LAB — CBC & DIFF AND RETIC
BASO%: 0.1 % (ref 0.0–2.0)
BASOS ABS: 0 10*3/uL (ref 0.0–0.1)
EOS ABS: 0 10*3/uL (ref 0.0–0.5)
EOS%: 0 % (ref 0.0–7.0)
HEMATOCRIT: 34.2 % — AB (ref 34.8–46.6)
HEMOGLOBIN: 11.3 g/dL — AB (ref 11.6–15.9)
IMMATURE RETIC FRACT: 12.4 % — AB (ref 1.60–10.00)
LYMPH#: 0.7 10*3/uL — AB (ref 0.9–3.3)
LYMPH%: 9.2 % — ABNORMAL LOW (ref 14.0–49.7)
MCH: 29 pg (ref 25.1–34.0)
MCHC: 33 g/dL (ref 31.5–36.0)
MCV: 87.9 fL (ref 79.5–101.0)
MONO#: 0.1 10*3/uL (ref 0.1–0.9)
MONO%: 1.6 % (ref 0.0–14.0)
NEUT#: 6.9 10*3/uL — ABNORMAL HIGH (ref 1.5–6.5)
NEUT%: 89.1 % — AB (ref 38.4–76.8)
PLATELETS: 172 10*3/uL (ref 145–400)
RBC: 3.89 10*6/uL (ref 3.70–5.45)
RDW: 14.9 % — AB (ref 11.2–14.5)
RETIC %: 2.03 % (ref 0.70–2.10)
RETIC CT ABS: 78.97 10*3/uL (ref 33.70–90.70)
WBC: 7.7 10*3/uL (ref 3.9–10.3)

## 2016-06-23 MED ORDER — SODIUM CHLORIDE 0.9 % IJ SOLN
10.0000 mL | INTRAMUSCULAR | Status: AC | PRN
  Administered 2016-06-23: 10 mL
  Filled 2016-06-23: qty 10

## 2016-06-23 MED ORDER — HEPARIN SOD (PORK) LOCK FLUSH 100 UNIT/ML IV SOLN
500.0000 [IU] | INTRAVENOUS | Status: AC | PRN
  Administered 2016-06-23: 500 [IU]
  Filled 2016-06-23: qty 5

## 2016-06-23 NOTE — Progress Notes (Signed)
Called pt to inform her potassium 2.8 today.  Pt seen by PCP yesterday and informed to take lasix (unknown dose) twice daily.  Pt also given rx for potassium by PCP.  Per Dr. Irene Limbo, pt instructed to stop lasix X one day then to restart at once daily.  Pt also instructed to take 16mq potassium twice daily until seen in office on Monday.  Pt verbalized understanding.

## 2016-06-23 NOTE — Progress Notes (Signed)
Pt came to flush room for lab draw. Pt states she has a CT scan scheduled for tomorrow. Pt wishes to remain accessed for contrast tomorrow. Biopatch and Sorbaview dressing applied to port. Pt understands to keep dressing clean and dry. Pt verbalized an understanding to return to [facility] if no heparin/nurse was available to de access pt in CT.

## 2016-06-24 ENCOUNTER — Ambulatory Visit (HOSPITAL_COMMUNITY)
Admission: RE | Admit: 2016-06-24 | Discharge: 2016-06-24 | Disposition: A | Discharge status: Home / Self Care | Payer: Medicare Other | Source: Ambulatory Visit | Attending: Hematology | Admitting: Hematology

## 2016-06-24 DIAGNOSIS — M84454A Pathological fracture, pelvis, initial encounter for fracture: Secondary | ICD-10-CM | POA: Insufficient documentation

## 2016-06-24 DIAGNOSIS — C3490 Malignant neoplasm of unspecified part of unspecified bronchus or lung: Secondary | ICD-10-CM | POA: Insufficient documentation

## 2016-06-24 DIAGNOSIS — I700 Atherosclerosis of aorta: Secondary | ICD-10-CM | POA: Insufficient documentation

## 2016-06-24 DIAGNOSIS — C7951 Secondary malignant neoplasm of bone: Secondary | ICD-10-CM | POA: Insufficient documentation

## 2016-06-24 DIAGNOSIS — J432 Centrilobular emphysema: Secondary | ICD-10-CM | POA: Insufficient documentation

## 2016-06-24 DIAGNOSIS — I2510 Atherosclerotic heart disease of native coronary artery without angina pectoris: Secondary | ICD-10-CM | POA: Diagnosis not present

## 2016-06-24 MED ORDER — IOPAMIDOL (ISOVUE-300) INJECTION 61%
100.0000 mL | Freq: Once | INTRAVENOUS | Status: AC | PRN
Start: 2016-06-24 — End: 2016-06-24
  Administered 2016-06-24: 100 mL via INTRAVENOUS

## 2016-06-27 ENCOUNTER — Other Ambulatory Visit: Payer: Self-pay | Admitting: *Deleted

## 2016-06-27 ENCOUNTER — Ambulatory Visit (HOSPITAL_BASED_OUTPATIENT_CLINIC_OR_DEPARTMENT_OTHER): Payer: Medicare Other | Admitting: Hematology

## 2016-06-27 ENCOUNTER — Telehealth: Payer: Self-pay | Admitting: Hematology

## 2016-06-27 ENCOUNTER — Encounter: Payer: Self-pay | Admitting: Hematology

## 2016-06-27 ENCOUNTER — Ambulatory Visit (HOSPITAL_BASED_OUTPATIENT_CLINIC_OR_DEPARTMENT_OTHER): Payer: Medicare Other

## 2016-06-27 VITALS — BP 132/55 | HR 69 | Temp 98.1°F | Resp 18 | Ht 67.0 in | Wt 145.3 lb

## 2016-06-27 DIAGNOSIS — C3490 Malignant neoplasm of unspecified part of unspecified bronchus or lung: Secondary | ICD-10-CM | POA: Diagnosis not present

## 2016-06-27 DIAGNOSIS — M7989 Other specified soft tissue disorders: Secondary | ICD-10-CM | POA: Diagnosis not present

## 2016-06-27 DIAGNOSIS — R197 Diarrhea, unspecified: Secondary | ICD-10-CM | POA: Diagnosis not present

## 2016-06-27 LAB — COMPREHENSIVE METABOLIC PANEL
ALT: 18 U/L (ref 0–55)
ANION GAP: 8 meq/L (ref 3–11)
AST: 20 U/L (ref 5–34)
Albumin: 2.9 g/dL — ABNORMAL LOW (ref 3.5–5.0)
Alkaline Phosphatase: 84 U/L (ref 40–150)
BILIRUBIN TOTAL: 0.34 mg/dL (ref 0.20–1.20)
BUN: 15.2 mg/dL (ref 7.0–26.0)
CALCIUM: 8.4 mg/dL (ref 8.4–10.4)
CHLORIDE: 107 meq/L (ref 98–109)
CO2: 24 meq/L (ref 22–29)
CREATININE: 0.9 mg/dL (ref 0.6–1.1)
EGFR: 65 mL/min/{1.73_m2} — ABNORMAL LOW (ref >90)
Glucose: 132 mg/dl (ref 70–140)
Potassium: 4.6 mEq/L (ref 3.5–5.1)
Sodium: 139 mEq/L (ref 136–145)
TOTAL PROTEIN: 6.5 g/dL (ref 6.4–8.3)

## 2016-06-27 LAB — CBC WITH DIFFERENTIAL/PLATELET
BASO%: 0.3 % (ref 0.0–2.0)
Basophils Absolute: 0 10*3/uL (ref 0.0–0.1)
EOS%: 0 % (ref 0.0–7.0)
Eosinophils Absolute: 0 10*3/uL (ref 0.0–0.5)
HEMATOCRIT: 34.9 % (ref 34.8–46.6)
HGB: 11.3 g/dL — ABNORMAL LOW (ref 11.6–15.9)
LYMPH#: 0.8 10*3/uL — AB (ref 0.9–3.3)
LYMPH%: 12.2 % — ABNORMAL LOW (ref 14.0–49.7)
MCH: 28.7 pg (ref 25.1–34.0)
MCHC: 32.4 g/dL (ref 31.5–36.0)
MCV: 88.6 fL (ref 79.5–101.0)
MONO#: 0.2 10*3/uL (ref 0.1–0.9)
MONO%: 2.4 % (ref 0.0–14.0)
NEUT%: 85.1 % — ABNORMAL HIGH (ref 38.4–76.8)
NEUTROS ABS: 5.4 10*3/uL (ref 1.5–6.5)
PLATELETS: 198 10*3/uL (ref 145–400)
RBC: 3.94 10*6/uL (ref 3.70–5.45)
RDW: 15 % — ABNORMAL HIGH (ref 11.2–14.5)
WBC: 6.3 10*3/uL (ref 3.9–10.3)

## 2016-06-27 MED ORDER — ZOLPIDEM TARTRATE ER 12.5 MG PO TBCR: 12.5000 mg | EXTENDED_RELEASE_TABLET | Freq: Every evening | ORAL | 0 refills | Status: DC | PRN

## 2016-06-27 MED ORDER — CITALOPRAM HYDROBROMIDE 20 MG PO TABS: 20.0000 mg | ORAL_TABLET | Freq: Every day | ORAL | 3 refills | Status: DC

## 2016-06-27 MED ORDER — PREDNISONE 20 MG PO TABS: 20.0000 mg | ORAL_TABLET | Freq: Every day | ORAL | 0 refills | Status: DC

## 2016-06-27 MED ORDER — LORAZEPAM 1 MG PO TABS: 1.0000 mg | ORAL_TABLET | Freq: Three times a day (TID) | ORAL | 0 refills | Status: DC | PRN

## 2016-06-27 MED ORDER — DIPHENOXYLATE-ATROPINE 2.5-0.025 MG PO TABS: 1.0000 | ORAL_TABLET | Freq: Four times a day (QID) | ORAL | 0 refills | Status: DC | PRN

## 2016-06-27 MED ORDER — OXYCODONE-ACETAMINOPHEN 5-325 MG PO TABS: 1.0000 | ORAL_TABLET | ORAL | 0 refills | Status: DC | PRN

## 2016-06-27 NOTE — Telephone Encounter (Signed)
Gave pt cal & avs °

## 2016-06-28 ENCOUNTER — Ambulatory Visit (HOSPITAL_COMMUNITY)
Admission: RE | Admit: 2016-06-28 | Discharge: 2016-06-28 | Disposition: A | Discharge status: Home / Self Care | Payer: Medicare Other | Source: Ambulatory Visit | Attending: Hematology | Admitting: Hematology

## 2016-06-28 DIAGNOSIS — M7989 Other specified soft tissue disorders: Secondary | ICD-10-CM | POA: Diagnosis not present

## 2016-06-28 DIAGNOSIS — C3490 Malignant neoplasm of unspecified part of unspecified bronchus or lung: Secondary | ICD-10-CM | POA: Insufficient documentation

## 2016-06-28 DIAGNOSIS — R197 Diarrhea, unspecified: Secondary | ICD-10-CM | POA: Diagnosis not present

## 2016-06-28 NOTE — Progress Notes (Signed)
[name]    HEMATOLOGY ONCOLOGY PROGRESS NOTE  Date of service: .06/28/2016  Patient Care Team: Tamsen Roers, MD as PCP - General (Family Medicine) [name], MD as Consulting Physician (Hematology and Oncology)  CHIEF COMPLAINTS/PURPOSE OF CONSULTATION: Follow-up for metastatic lung cancer  DIAGNOSIS:   #1 Metastatic non-small cell lung cancer with bilateral lung nodules and large metastatic lesion in the left Ilium. #2 Barrett's esophagus with some evidence of intramucosal adenocarcinoma of the esophagus. 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  Previous Treatment  1 Palliative radiation therapy to the large left ilium metastases 2. IV Nivolumab x 20ycles (discontinued due to likely immune colitis) 3. Xgeva 1267mSC q4weeks for bone metastases.   HISTORY OF PRESENTING ILLNESS: (plz see my previous consultation for details of initial presentation)  INTERVAL HISTORY  Cindy ToSaccos here for her scheduled followup.  She continues to have semisolid stools 3-5 times a day sometimes more liquid. She had a colonoscopy with Dr. PyHilarie Fredricksonhich showed some patchy mild inflammation in the entire examine colon, moderate diverticulosis in the rectosigmoid sigmoid colon and descending colon. Biopsies were done which showed active colitis with focal erosion. Patient has been started on Lialda. Patient  has been nearly tapered off her prednisone and does not note any worsening of her diarrhea with the prednisone taper. No rectal bleeding. No abdominal pain or cramping. No blood or mucus in the stools. Patient notes that her energy levels been good and she is eating well. Had a CT chest abdomen pelvis on 06/24/2016 That showed no new or progressive metastatic  disease in the chest abdomen or pelvis. Patient has no other new focal symptoms. Notes some increase in her left lower extremity swelling. We discussed that we shall get an ultrasound to rule out DVT.  MEDICAL HISTORY:  Past Medical History:  Diagnosis Date  . Barrett's esophagus   . Bone neoplasm 06/24/2015  . [facility]   metastatic poorly differentiated carcinoma. tumor left groin surgical removal with radiation tx.  . Cataract    BILATERAL  . Cigarette smoker two packs a day or less    Currently still smoking 2 PPD - Not interested in quitting at this time.  . Colon polyps    hyperplastic, tubular adenomas, tubulovillous adenoma  . Cough, persistent    hx. lung cancer ? primary-being evaluated, unsure of primary site.  . Depression 06/24/2015  . Diverticulosis   . [place]  . Endometriosis    Hysterectomy with BSO at age [age]rs  . [place] [place]   intramucosal  . Gastritis   . GERD (gastroesophageal reflux disease)   . H/O: pneumonia   . Heavy smoker (more than 20 cigarettes per day) 06/24/2015  . Hiatal hernia   . Hyperlipidemia   . Hypertension 06/24/2015   likely improved incidental to 40 lbs weight loss from her neoplasm. No Longer taking med for this as of 08-06-15  . IBS (irritable bowel syndrome)   . Pain    left hip-persistent"tumor of bone"-radiation tx. 10.  . Pulmonary nodules   . Swelling of ankle    bilateral  . Vitamin D deficiency disease    SURGICAL HISTORY: Past Surgical History:  Procedure Laterality Date  . ABDOMINAL HYSTERECTOMY    .  BARTHOLIN GLAND CYST EXCISION  75 yo ago   Does not want if it was an infected cyst or tumor. Was soon as delivery  . COLONOSCOPY W/ POLYPECTOMY     multiple times - last done 09/2014 per patient.  . ESOPHAGOGASTRODUODENOSCOPY (EGD) WITH PROPOFOL N/A 08/11/2015   Procedure: ESOPHAGOGASTRODUODENOSCOPY (EGD) WITH PROPOFOL;  Surgeon: [name], MD;  Location: [facility];   Service: Gastroenterology;  Laterality: N/A;  . GANGLION CYST EXCISION    . KNEE ARTHROSCOPY  age about 76 yrs  . TONSILLECTOMY    . TOTAL ABDOMINAL HYSTERECTOMY W/ BILATERAL SALPINGOOPHORECTOMY  at age 77 yrs   For endometriosis    SOCIAL HISTORY: Social History   Social History  . Marital status: Widowed    Spouse name: N/A  . Number of children: 2  . Years of education: N/A   Occupational History  . Not on file.   Social History Main Topics  . Smoking status: Former Smoker    Packs/day: 1.00    Years: 60.00    Types: Cigarettes    Quit date: 12/06/2015  . Smokeless tobacco: Never Used  . Alcohol use No  . Drug use: No  . Sexual activity: No   Other Topics Concern  . Not on file   Social History Narrative  . No narrative on file    FAMILY HISTORY: Family History  Problem Relation Age of Onset  . Colon cancer Brother   . Colon cancer Brother   . Stroke Mother   . Colon cancer Father   . Breast cancer Daughter 79    ER/PR+ stage II    ALLERGIES:  is allergic to penicillins; remeron [mirtazapine]; and latex. patient wonders if she has a penicillin allergy but notes that she is uncertain about this.  MEDICATIONS:  Current Outpatient Prescriptions  Medication Sig Dispense Refill  . amLODipine (NORVASC) 5 MG tablet TAKE 1 TABLET (5 MG TOTAL) BY MOUTH DAILY. 30 tablet 1  . artificial tears (LACRILUBE) OINT ophthalmic ointment Place into both eyes at bedtime. 1 Tube 1  . clotrimazole-betamethasone (LOTRISONE) cream Apply 1 application topically 2 (two) times daily. Apply to perianal skin twice daily. 30 g 1  . docusate sodium (COLACE) 100 MG capsule Take 100 mg by mouth daily as needed for mild constipation. Reported on 02/23/2016    . Hypromellose (ARTIFICIAL TEARS) 0.4 % SOLN Apply 1 drop to eye QID. 1 Bottle 1  . lactobacillus acidophilus & bulgar (LACTINEX) chewable tablet Chew 1 tablet by mouth 3 (three) times daily with meals. 60 tablet 1  .  lidocaine-prilocaine (EMLA) cream Apply small amount over port 1-2 hours prior to treatment, cover with plastic wrap (DO NOT RUB IN). 30 g prn  . mesalamine (LIALDA) 1.2 g EC tablet Take 2 tablets (2.4 g total) by mouth daily with breakfast. 60 tablet 3  . NICOTINE STEP 1 21 MG/24HR patch PLACE 1 PATCH (21 MG TOTAL) ONTO THE SKIN DAILY. 28 patch 0  . omeprazole (PRILOSEC) 40 MG capsule TAKE ONE CAPSULE BY MOUTH EVERY DAY *NEEDS OFFICE VISIT FOR FURTHER REFILL* 30 capsule 5  . oxyCODONE-acetaminophen (PERCOCET/ROXICET) 5-325 MG tablet Take 1 tablet by mouth every 4 (four) hours as needed for severe pain. 60 tablet 0  . polycarbophil (FIBERCON) 625 MG tablet Take 2 tablets (1,250 mg total) by mouth 2 (two) times daily. 120 tablet 1  . potassium chloride 20 MEQ TBCR 45mq (2 times daily) PO three times a days for 1 day then 2  times daily x 3 days then once daily 30 tablet 0  . potassium chloride SA (K-DUR,KLOR-CON) 20 MEQ tablet 49mq 2 times daily x 4 days then 455m once daily 40 tablet 0  . predniSONE (DELTASONE) 20 MG tablet Take 1 tablet (20 mg total) by mouth daily with breakfast. 20 tablet 0  . PRESCRIPTION MEDICATION Antibody Plan [facility]    . valsartan (DIOVAN) 80 MG tablet     . citalopram (CELEXA) 20 MG tablet Take 1 tablet (20 mg total) by mouth daily. 30 tablet 3  . diphenoxylate-atropine (LOMOTIL) 2.5-0.025 MG tablet Take 1-2 tablets by mouth 4 (four) times daily as needed for diarrhea or loose stools. 30 tablet 0  . LORazepam (ATIVAN) 1 MG tablet Take 1 tablet (1 mg total) by mouth every 8 (eight) hours as needed for anxiety (or nausea). 60 tablet 0  . zolpidem (AMBIEN CR) 12.5 MG CR tablet Take 1 tablet (12.5 mg total) by mouth at bedtime as needed for sleep. 30 tablet 0   No current facility-administered medications for this visit.     REVIEW OF SYSTEMS:    10 point review of systems done and was noted to be negative except as noted above.  PHYSICAL EXAMINATION: ECOG PERFORMANCE  STATUS: 2 - Symptomatic, <50% confined to bed  Vitals:   06/27/16 1505  BP: (!) 132/55  Pulse: 69  Resp: 18  Temp: 98.1 F (36.7 C)   Filed Weights   06/27/16 1505  Weight: 145 lb 4.8 oz (65.9 kg)  . Wt Readings from Last 3 Encounters:  06/27/16 145 lb 4.8 oz (65.9 kg)  06/15/16 145 lb (65.8 kg)  06/09/16 145 lb (65.8 kg)   GENERAL:alert, NAD, appears well. SKIN: skin color, texture, turgor are normal, no rashes or significant lesions EYES: normal, conjunctiva are pink and non-injected, sclera clear OROPHARYNX:no exudate, no erythema and lips, buccal mucosa, and tongue normal  NECK: supple, thyroid normal size, non-tender, without nodularity LYMPH:  no palpable lymphadenopathy in the cervical, axillary or inguinal LUNGS: clear to auscultation and bilateral reduced breath sounds with normal respiratory effort  HEART: regular rate & rhythm and no murmurs and resolved lower extremity edema ABDOMEN:abdomen soft, non-tender and normal bowel sounds Musculoskeletal: No pedal edema. No calf pain or tenderness.  PSYCH: alert & oriented x 3 with fluent speech NEURO: no focal motor/sensory deficits.  LABORATORY DATA:  I have reviewed the data as listed . CBC Latest Ref Rng & Units 06/27/2016 06/23/2016 06/09/2016  WBC 3.9 - 10.3 10e3/uL 6.3 7.7 9.3  Hemoglobin 11.6 - 15.9 g/dL 11.3(L) 11.3(L) 12.0  Hematocrit 34.8 - 46.6 % 34.9 34.2(L) 37.2  Platelets 145 - 400 10e3/uL 198 172 184   . CMP Latest Ref Rng & Units 06/27/2016 06/23/2016 06/09/2016  Glucose 70 - 140 mg/dl 132 117 127  BUN 7.0 - 26.0 mg/dL 15.2 14.7 22.9  Creatinine 0.6 - 1.1 mg/dL 0.9 0.9 0.9  Sodium 136 - 145 mEq/L 139 144 139  Potassium 3.5 - 5.1 mEq/L 4.6 2.8(LL) 4.1  Chloride 101 - 111 mmol/L - - -  CO2 22 - 29 mEq/L _0 Calcium 8.4 - 10.4 mg/dL 8.4 8.1(L) 8.7  Total Protein 6.4 - 8.3 g/dL 6.5 6.5 6.4  Total Bilirubin 0.20 - 1.20 mg/dL 0.34 0.49 0.40  Alkaline Phos 40 - 150 U/L 84 81 73  AST 5 - 34 U/L _1 ALT 0 - 55 U/L _2 RADIOGRAPHIC STUDIES:  I have personally reviewed the radiological images as listed and agreed with the findings in the report. Ct Chest W Contrast  Result Date: 06/24/2016 CLINICAL DATA:  Restaging metastatic lung cancer status post chemotherapy and immunotherapy completed May 2017. History of hysterectomy. EXAM: CT CHEST, ABDOMEN, AND PELVIS WITH CONTRAST TECHNIQUE: Multidetector CT imaging of the chest, abdomen and pelvis was performed following the standard protocol during bolus administration of intravenous contrast. CONTRAST:  17m ISOVUE-300 IOPAMIDOL (ISOVUE-300) INJECTION 61% COMPARISON:  04/19/2016 CT chest, abdomen and pelvis. FINDINGS: CT CHEST Mediastinum/Nodes: Normal heart size. No significant pericardial fluid/thickening. Right internal jugular Port-A-Cath terminates in the lower third of the superior vena cava. Left anterior descending coronary atherosclerosis. Atherosclerotic nonaneurysmal thoracic aorta. Stable top-normal caliber pulmonary arteries. No central pulmonary emboli. No discrete thyroid nodules. Unremarkable esophagus. No pathologically enlarged axillary, mediastinal or hilar lymph nodes. Lungs/Pleura: No pneumothorax. No pleural effusion. Mild centrilobular emphysema. Irregular 1.2 x 0.3 cm left lower lobe pulmonary nodule (series 5/ image 34), previously 1.2 x 0.4 cm, not appreciably changed. Right lower lobe 0.5 cm pulmonary nodule (series 5/ image 37), previously 0.6 cm, not appreciably changed. Left lower lobe 3 mm pulmonary nodule (series 5/image 52), previously 4 mm, not appreciably changed. No acute consolidative airspace disease or new significant pulmonary nodules. Musculoskeletal: No aggressive appearing focal osseous lesions. Mild thoracic spondylosis. CT ABDOMEN AND PELVIS Hepatobiliary: Two simple liver cysts, largest 1.6 cm. At least 6 additional subcentimeter hypodense liver lesions scattered throughout the liver, too small to  characterize, not appreciably changed. No new liver lesions. Normal gallbladder with no radiopaque cholelithiasis. No biliary ductal dilatation. Stable small periampullary duodenal diverticulum. Pancreas: Normal, with no mass or duct dilation. Spleen: Normal size. No mass. Adrenals/Urinary Tract: Normal adrenals. No hydronephrosis. No renal mass. Normal bladder. Stomach/Bowel: Grossly normal stomach. Normal caliber small bowel with no small bowel wall thickening. Normal appendix. Normal large bowel with no diverticulosis, large bowel wall thickening or pericolonic fat stranding. Vascular/Lymphatic: Atherosclerotic nonaneurysmal abdominal aorta. Patent portal, splenic, hepatic and renal veins. No pathologically enlarged lymph nodes in the abdomen or pelvis. Reproductive: Status post hysterectomy, with no abnormal findings at the vaginal cuff. No adnexal mass. Other: No pneumoperitoneum, ascites or focal fluid collection. Musculoskeletal: Continued reduction in the size of the soft tissue component of the large left iliac wing mixed lytic and sclerotic metastasis with slight evolution of the periosteal new bone surrounding the left iliac wing metastasis, which is associated with a comminuted nondisplaced pathologic fracture. Stable mild patchy sclerosis in the pubic symphysis and left superior pubic ramus, probably representing post treatment change. No new focal osseous lesions in the abdomen or pelvis. Mild lumbar spondylosis. IMPRESSION: 1. No new or progressive metastatic disease in the chest, abdomen or pelvis. 2. Continued reduction in the size of the soft tissue component of the large left iliac wing mixed lytic and sclerotic metastasis with associated comminuted nondisplaced pathologic fracture and evolving periosteal new bone formation. 3. Stable bilateral pulmonary nodules. 4. Additional findings include aortic atherosclerosis, coronary atherosclerosis and mild centrilobular emphysema. Electronically Signed    By: JIlona SorrelM.D.   On: 06/24/2016 15:07   Ct Abdomen Pelvis W Contrast  Result Date: 06/24/2016 CLINICAL DATA:  Restaging metastatic lung cancer status post chemotherapy and immunotherapy completed May 2017. History of hysterectomy. EXAM: CT CHEST, ABDOMEN, AND PELVIS WITH CONTRAST TECHNIQUE: Multidetector CT imaging of the chest, abdomen and pelvis was performed following the standard protocol during bolus administration of intravenous contrast. CONTRAST:  1014mISOVUE-300  IOPAMIDOL (ISOVUE-300) INJECTION 61% COMPARISON:  04/19/2016 CT chest, abdomen and pelvis. FINDINGS: CT CHEST Mediastinum/Nodes: Normal heart size. No significant pericardial fluid/thickening. Right internal jugular Port-A-Cath terminates in the lower third of the superior vena cava. Left anterior descending coronary atherosclerosis. Atherosclerotic nonaneurysmal thoracic aorta. Stable top-normal caliber pulmonary arteries. No central pulmonary emboli. No discrete thyroid nodules. Unremarkable esophagus. No pathologically enlarged axillary, mediastinal or hilar lymph nodes. Lungs/Pleura: No pneumothorax. No pleural effusion. Mild centrilobular emphysema. Irregular 1.2 x 0.3 cm left lower lobe pulmonary nodule (series 5/ image 34), previously 1.2 x 0.4 cm, not appreciably changed. Right lower lobe 0.5 cm pulmonary nodule (series 5/ image 37), previously 0.6 cm, not appreciably changed. Left lower lobe 3 mm pulmonary nodule (series 5/image 52), previously 4 mm, not appreciably changed. No acute consolidative airspace disease or new significant pulmonary nodules. Musculoskeletal: No aggressive appearing focal osseous lesions. Mild thoracic spondylosis. CT ABDOMEN AND PELVIS Hepatobiliary: Two simple liver cysts, largest 1.6 cm. At least 6 additional subcentimeter hypodense liver lesions scattered throughout the liver, too small to characterize, not appreciably changed. No new liver lesions. Normal gallbladder with no radiopaque  cholelithiasis. No biliary ductal dilatation. Stable small periampullary duodenal diverticulum. Pancreas: Normal, with no mass or duct dilation. Spleen: Normal size. No mass. Adrenals/Urinary Tract: Normal adrenals. No hydronephrosis. No renal mass. Normal bladder. Stomach/Bowel: Grossly normal stomach. Normal caliber small bowel with no small bowel wall thickening. Normal appendix. Normal large bowel with no diverticulosis, large bowel wall thickening or pericolonic fat stranding. Vascular/Lymphatic: Atherosclerotic nonaneurysmal abdominal aorta. Patent portal, splenic, hepatic and renal veins. No pathologically enlarged lymph nodes in the abdomen or pelvis. Reproductive: Status post hysterectomy, with no abnormal findings at the vaginal cuff. No adnexal mass. Other: No pneumoperitoneum, ascites or focal fluid collection. Musculoskeletal: Continued reduction in the size of the soft tissue component of the large left iliac wing mixed lytic and sclerotic metastasis with slight evolution of the periosteal new bone surrounding the left iliac wing metastasis, which is associated with a comminuted nondisplaced pathologic fracture. Stable mild patchy sclerosis in the pubic symphysis and left superior pubic ramus, probably representing post treatment change. No new focal osseous lesions in the abdomen or pelvis. Mild lumbar spondylosis. IMPRESSION: 1. No new or progressive metastatic disease in the chest, abdomen or pelvis. 2. Continued reduction in the size of the soft tissue component of the large left iliac wing mixed lytic and sclerotic metastasis with associated comminuted nondisplaced pathologic fracture and evolving periosteal new bone formation. 3. Stable bilateral pulmonary nodules. 4. Additional findings include aortic atherosclerosis, coronary atherosclerosis and mild centrilobular emphysema. Electronically Signed   By: Ilona Sorrel M.D.   On: 06/24/2016 15:07    ASSESSMENT & PLAN:   #1 Metastatic poorly  differentiated carcinoma with likely lung primary [non-small cell lung cancer].  CT of the head with and without contrast showed no evidence of metastatic disease. Patient notes much improved pain control. EGFR blood test mutation analysis negative. Patient's pain is much better controlled after radiation for the painful ilium met. CT chest abdomen pelvis 04/19/2016 shows no evidence of disease progression. Patient tolerated Nivolumab very well until recently when she developed grade 2 Immune colitis as a result of which Nivolumab has been discontinued. CT chest abdomen pelvis on 06/24/2016 shows no evidence of new disease or progression of metastatic disease. #2 diarrhea- grade 2 likely related to immune colitis from her Nivolumab. This is significantly improvied with prednisone at this time. She notes it is back to her  baseline of 3-5 semi-loose stools per day which is what it was pre-treatment. GI panel and c diff neg.  Patient had a colonoscopy with Dr. Elmo Putt that showed mild scattered colitis and biopsy showed active colitis. #3 hypokalemia due to diarrhea -potassium was 2.8 on previous check now corrected at 4.6 Plan -CT scan results were discussed in details with the patient. Given that there is no new disease and there is no disease progression and she is enjoying her quality of life we will hold off on additional palliative treatments at this time. -We will continue every 4 weekly Xgeva for her bone metastases.continue -will continue  to taper off prednisone she will be off this week. -Continually Lialda as per Dr [name] and continue f/u with Dr [name] for management of inflammatory colitis ---using loperamide prn but wants to try something else -given prescription for when necessary Lomotil instead of Imodium . --Return to care in 1 month for clinical reassessment and labs . -Will plan to repeat CT chest abdomen pelvis in 2 months   #4 increased left lower extremity swelling with some  discomfort  Plan  -Lower extremity bilateral ultrasound to rule out DVT especially in the left lower extremity   #5 Barrett's esophagus 4cms in the distal esophagus with low and high-grade dysplasia cannot rule out an early intra-mucosal esophageal adenocarcinoma. Plan -Patient being monitored by Dr. [name] from GI. -Patient was seen at [facility] for further management and saw Dr.Kochar, Arie Sabina, MD and Dr Adria Devon.   Return to care with Dr. Irene Limbo in 4 week with CBC, CMP    . Orders Placed This Encounter  Procedures  . CBC with Differential    Standing Status:   Future    Number of Occurrences:   1    Standing Expiration Date:   06/27/2017  . Comprehensive metabolic panel    Standing Status:   Future    Number of Occurrences:   1    Standing Expiration Date:   06/27/2017  . CBC & Diff and Retic    Standing Status:   Future    Standing Expiration Date:   06/27/2017  . Comprehensive metabolic panel    Standing Status:   Future    Standing Expiration Date:   06/27/2017    All questions were answered. The patient knows to call the clinic with any problems, questions or concerns.  Sullivan Lone MD [place] Hematology/Oncology Physician [facility]  (Office): [phone] (Work cell): [phone] (Fax): [phone]

## 2016-06-28 NOTE — Progress Notes (Signed)
VASCULAR LAB PRELIMINARY  PRELIMINARY  PRELIMINARY  PRELIMINARY  Bilateral lower extremity venous duplex has been  completed.    Bilateral:  No evidence of DVT, superficial thrombosis, or Baker's Cyst.  Called with results, spoke with Dr. Irene Limbo.  Mikel Pyon, RVT, RDMS 06/28/2016, 9:26 AM

## 2016-07-04 ENCOUNTER — Other Ambulatory Visit: Payer: Self-pay | Admitting: Hematology

## 2016-07-06 ENCOUNTER — Other Ambulatory Visit: Payer: Self-pay | Admitting: *Deleted

## 2016-07-06 MED ORDER — OMEPRAZOLE 40 MG PO CPDR: DELAYED_RELEASE_CAPSULE | ORAL | 0 refills | Status: DC

## 2016-07-07 ENCOUNTER — Other Ambulatory Visit: Payer: Self-pay | Admitting: *Deleted

## 2016-07-07 ENCOUNTER — Ambulatory Visit (HOSPITAL_BASED_OUTPATIENT_CLINIC_OR_DEPARTMENT_OTHER): Payer: Medicare Other

## 2016-07-07 VITALS — HR 82 | Temp 97.8°F | Resp 20

## 2016-07-07 DIAGNOSIS — C7951 Secondary malignant neoplasm of bone: Secondary | ICD-10-CM

## 2016-07-07 DIAGNOSIS — C3491 Malignant neoplasm of unspecified part of right bronchus or lung: Secondary | ICD-10-CM | POA: Diagnosis not present

## 2016-07-07 MED ORDER — DENOSUMAB 120 MG/1.7ML ~~LOC~~ SOLN
120.0000 mg | Freq: Once | SUBCUTANEOUS | Status: AC
  Administered 2016-07-07: 120 mg via SUBCUTANEOUS
  Filled 2016-07-07: qty 1.7

## 2016-07-07 MED ORDER — DIPHENOXYLATE-ATROPINE 2.5-0.025 MG PO TABS: 1.0000 | ORAL_TABLET | Freq: Four times a day (QID) | ORAL | 0 refills | Status: DC | PRN

## 2016-07-07 NOTE — Patient Instructions (Signed)
Denosumab injection  What is this medicine?  DENOSUMAB (den oh sue mab) slows bone breakdown. Prolia is used to treat osteoporosis in women after menopause and in men. Xgeva is used to prevent bone fractures and other bone problems caused by cancer bone metastases. Xgeva is also used to treat giant cell tumor of the bone.  This medicine may be used for other purposes; ask your health care provider or pharmacist if you have questions.  What should I tell my health care provider before I take this medicine?  They need to know if you have any of these conditions:  -dental disease  -eczema  -infection or history of infections  -kidney disease or on dialysis  -low blood calcium or vitamin D  -malabsorption syndrome  -scheduled to have surgery or tooth extraction  -taking medicine that contains denosumab  -thyroid or parathyroid disease  -an unusual reaction to denosumab, other medicines, foods, dyes, or preservatives  -pregnant or trying to get pregnant  -breast-feeding  How should I use this medicine?  This medicine is for injection under the skin. It is given by a health care professional in a hospital or clinic setting.  If you are getting Prolia, a special MedGuide will be given to you by the pharmacist with each prescription and refill. Be sure to read this information carefully each time.  For Prolia, talk to your pediatrician regarding the use of this medicine in children. Special care may be needed. For Xgeva, talk to your pediatrician regarding the use of this medicine in children. While this drug may be prescribed for children as young as 13 years for selected conditions, precautions do apply.  Overdosage: If you think you have taken too much of this medicine contact a poison control center or emergency room at once.  NOTE: This medicine is only for you. Do not share this medicine with others.  What if I miss a dose?  It is important not to miss your dose. Call your doctor or health care professional if you are  unable to keep an appointment.  What may interact with this medicine?  Do not take this medicine with any of the following medications:  -other medicines containing denosumab  This medicine may also interact with the following medications:  -medicines that suppress the immune system  -medicines that treat cancer  -steroid medicines like prednisone or cortisone  This list may not describe all possible interactions. Give your health care provider a list of all the medicines, herbs, non-prescription drugs, or dietary supplements you use. Also tell them if you smoke, drink alcohol, or use illegal drugs. Some items may interact with your medicine.  What should I watch for while using this medicine?  Visit your doctor or health care professional for regular checks on your progress. Your doctor or health care professional may order blood tests and other tests to see how you are doing.  Call your doctor or health care professional if you get a cold or other infection while receiving this medicine. Do not treat yourself. This medicine may decrease your body's ability to fight infection.  You should make sure you get enough calcium and vitamin D while you are taking this medicine, unless your doctor tells you not to. Discuss the foods you eat and the vitamins you take with your health care professional.  See your dentist regularly. Brush and floss your teeth as directed. Before you have any dental work done, tell your dentist you are receiving this medicine.  Do   not become pregnant while taking this medicine or for 5 months after stopping it. Women should inform their doctor if they wish to become pregnant or think they might be pregnant. There is a potential for serious side effects to an unborn child. Talk to your health care professional or pharmacist for more information.  What side effects may I notice from receiving this medicine?  Side effects that you should report to your doctor or health care professional as soon as  possible:  -allergic reactions like skin rash, itching or hives, swelling of the face, lips, or tongue  -breathing problems  -chest pain  -fast, irregular heartbeat  -feeling faint or lightheaded, falls  -fever, chills, or any other sign of infection  -muscle spasms, tightening, or twitches  -numbness or tingling  -skin blisters or bumps, or is dry, peels, or red  -slow healing or unexplained pain in the mouth or jaw  -unusual bleeding or bruising  Side effects that usually do not require medical attention (Report these to your doctor or health care professional if they continue or are bothersome.):  -muscle pain  -stomach upset, gas  This list may not describe all possible side effects. Call your doctor for medical advice about side effects. You may report side effects to FDA at 1-800-FDA-1088.  Where should I keep my medicine?  This medicine is only given in a clinic, doctor's office, or other health care setting and will not be stored at home.  NOTE: This sheet is a summary. It may not cover all possible information. If you have questions about this medicine, talk to your doctor, pharmacist, or health care provider.      2016, Elsevier/Gold Standard. (2012-05-21 12:37:47)

## 2016-07-13 ENCOUNTER — Other Ambulatory Visit: Payer: Self-pay | Admitting: Nurse Practitioner

## 2016-07-13 DIAGNOSIS — C3490 Malignant neoplasm of unspecified part of unspecified bronchus or lung: Secondary | ICD-10-CM

## 2016-07-15 ENCOUNTER — Ambulatory Visit (HOSPITAL_BASED_OUTPATIENT_CLINIC_OR_DEPARTMENT_OTHER): Payer: Medicare Other

## 2016-07-15 ENCOUNTER — Other Ambulatory Visit (HOSPITAL_BASED_OUTPATIENT_CLINIC_OR_DEPARTMENT_OTHER): Payer: Medicare Other

## 2016-07-15 ENCOUNTER — Ambulatory Visit (HOSPITAL_BASED_OUTPATIENT_CLINIC_OR_DEPARTMENT_OTHER): Payer: Medicare Other | Admitting: Nurse Practitioner

## 2016-07-15 ENCOUNTER — Other Ambulatory Visit: Payer: Self-pay | Admitting: Nurse Practitioner

## 2016-07-15 VITALS — BP 151/71 | HR 54 | Temp 97.7°F | Resp 18 | Ht 67.0 in | Wt 169.5 lb

## 2016-07-15 DIAGNOSIS — C7951 Secondary malignant neoplasm of bone: Secondary | ICD-10-CM | POA: Diagnosis not present

## 2016-07-15 DIAGNOSIS — C799 Secondary malignant neoplasm of unspecified site: Secondary | ICD-10-CM

## 2016-07-15 DIAGNOSIS — K521 Toxic gastroenteritis and colitis: Secondary | ICD-10-CM

## 2016-07-15 DIAGNOSIS — E876 Hypokalemia: Secondary | ICD-10-CM

## 2016-07-15 DIAGNOSIS — E860 Dehydration: Secondary | ICD-10-CM

## 2016-07-15 DIAGNOSIS — R630 Anorexia: Secondary | ICD-10-CM

## 2016-07-15 DIAGNOSIS — E46 Unspecified protein-calorie malnutrition: Secondary | ICD-10-CM

## 2016-07-15 DIAGNOSIS — R609 Edema, unspecified: Secondary | ICD-10-CM

## 2016-07-15 DIAGNOSIS — C3490 Malignant neoplasm of unspecified part of unspecified bronchus or lung: Secondary | ICD-10-CM

## 2016-07-15 DIAGNOSIS — R112 Nausea with vomiting, unspecified: Secondary | ICD-10-CM

## 2016-07-15 LAB — CBC WITH DIFFERENTIAL/PLATELET
BASO%: 0.4 % (ref 0.0–2.0)
Basophils Absolute: 0 10*3/uL (ref 0.0–0.1)
EOS%: 0.5 % (ref 0.0–7.0)
Eosinophils Absolute: 0 10*3/uL (ref 0.0–0.5)
HEMATOCRIT: 37.1 % (ref 34.8–46.6)
HEMOGLOBIN: 12 g/dL (ref 11.6–15.9)
LYMPH#: 1.2 10*3/uL (ref 0.9–3.3)
LYMPH%: 23.5 % (ref 14.0–49.7)
MCH: 27.4 pg (ref 25.1–34.0)
MCHC: 32.4 g/dL (ref 31.5–36.0)
MCV: 84.6 fL (ref 79.5–101.0)
MONO#: 0.4 10*3/uL (ref 0.1–0.9)
MONO%: 8.1 % (ref 0.0–14.0)
NEUT%: 67.5 % (ref 38.4–76.8)
NEUTROS ABS: 3.3 10*3/uL (ref 1.5–6.5)
PLATELETS: 105 10*3/uL — AB (ref 145–400)
RBC: 4.39 10*6/uL (ref 3.70–5.45)
RDW: 15.4 % — AB (ref 11.2–14.5)
WBC: 4.9 10*3/uL (ref 3.9–10.3)

## 2016-07-15 LAB — COMPREHENSIVE METABOLIC PANEL
ALT: 28 U/L (ref 0–55)
ANION GAP: 11 meq/L (ref 3–11)
AST: 43 U/L — ABNORMAL HIGH (ref 5–34)
Albumin: 2.4 g/dL — ABNORMAL LOW (ref 3.5–5.0)
Alkaline Phosphatase: 90 U/L (ref 40–150)
BILIRUBIN TOTAL: 0.41 mg/dL (ref 0.20–1.20)
BUN: 30.7 mg/dL — AB (ref 7.0–26.0)
CALCIUM: 7.8 mg/dL — AB (ref 8.4–10.4)
CO2: 22 mEq/L (ref 22–29)
CREATININE: 1.4 mg/dL — AB (ref 0.6–1.1)
Chloride: 105 mEq/L (ref 98–109)
EGFR: 38 mL/min/{1.73_m2} — ABNORMAL LOW (ref >90)
Glucose: 108 mg/dl (ref 70–140)
Potassium: 2.3 mEq/L — CL (ref 3.5–5.1)
Sodium: 138 mEq/L (ref 136–145)
TOTAL PROTEIN: 5.8 g/dL — AB (ref 6.4–8.3)

## 2016-07-15 LAB — MAGNESIUM: MAGNESIUM: 1.8 mg/dL (ref 1.5–2.5)

## 2016-07-15 MED ORDER — SODIUM CHLORIDE 0.9 % IV SOLN
INTRAVENOUS | Status: AC
  Administered 2016-07-15: 12:00:00 via INTRAVENOUS
  Filled 2016-07-15: qty 1000

## 2016-07-15 MED ORDER — SODIUM CHLORIDE 0.9 % IV SOLN
8.0000 mg | Freq: Once | INTRAVENOUS | Status: AC
  Administered 2016-07-15: 8 mg via INTRAVENOUS
  Filled 2016-07-15: qty 4

## 2016-07-15 MED ORDER — SODIUM CHLORIDE 0.9% FLUSH
10.0000 mL | Freq: Once | INTRAVENOUS | Status: AC
  Administered 2016-07-15: 10 mL via INTRAVENOUS
  Filled 2016-07-15: qty 10

## 2016-07-15 MED ORDER — HEPARIN SOD (PORK) LOCK FLUSH 100 UNIT/ML IV SOLN
500.0000 [IU] | Freq: Once | INTRAVENOUS | Status: AC
  Administered 2016-07-15: 500 [IU] via INTRAVENOUS
  Filled 2016-07-15: qty 5

## 2016-07-15 MED ORDER — POTASSIUM CHLORIDE CRYS ER 20 MEQ PO TBCR: 20.0000 meq | EXTENDED_RELEASE_TABLET | Freq: Two times a day (BID) | ORAL | 0 refills | Status: DC

## 2016-07-15 MED ORDER — ONDANSETRON HCL 8 MG PO TABS: 8.0000 mg | ORAL_TABLET | Freq: Three times a day (TID) | ORAL | 2 refills | Status: DC | PRN

## 2016-07-15 MED ORDER — POTASSIUM CHLORIDE CRYS ER 20 MEQ PO TBCR
20.0000 meq | EXTENDED_RELEASE_TABLET | Freq: Once | ORAL | Status: AC
  Administered 2016-07-15: 20 meq via ORAL
  Filled 2016-07-15: qty 1

## 2016-07-15 NOTE — Patient Instructions (Signed)

## 2016-07-18 ENCOUNTER — Ambulatory Visit (HOSPITAL_BASED_OUTPATIENT_CLINIC_OR_DEPARTMENT_OTHER): Payer: Medicare Other | Admitting: Nurse Practitioner

## 2016-07-18 ENCOUNTER — Ambulatory Visit: Payer: Medicare Other

## 2016-07-18 ENCOUNTER — Telehealth: Payer: Self-pay | Admitting: Hematology

## 2016-07-18 ENCOUNTER — Inpatient Hospital Stay (HOSPITAL_COMMUNITY): Admission: RE | Admit: 2016-07-18 | Payer: Medicare Other | Source: Ambulatory Visit

## 2016-07-18 ENCOUNTER — Other Ambulatory Visit (HOSPITAL_BASED_OUTPATIENT_CLINIC_OR_DEPARTMENT_OTHER): Payer: Medicare Other

## 2016-07-18 ENCOUNTER — Encounter: Payer: Self-pay | Admitting: Nurse Practitioner

## 2016-07-18 VITALS — BP 150/77 | HR 70 | Temp 98.3°F | Resp 18 | Ht 67.0 in | Wt 142.5 lb

## 2016-07-18 DIAGNOSIS — R197 Diarrhea, unspecified: Secondary | ICD-10-CM

## 2016-07-18 DIAGNOSIS — E860 Dehydration: Secondary | ICD-10-CM | POA: Insufficient documentation

## 2016-07-18 DIAGNOSIS — E876 Hypokalemia: Secondary | ICD-10-CM

## 2016-07-18 DIAGNOSIS — E8809 Other disorders of plasma-protein metabolism, not elsewhere classified: Secondary | ICD-10-CM | POA: Insufficient documentation

## 2016-07-18 DIAGNOSIS — C3490 Malignant neoplasm of unspecified part of unspecified bronchus or lung: Secondary | ICD-10-CM | POA: Diagnosis not present

## 2016-07-18 DIAGNOSIS — R112 Nausea with vomiting, unspecified: Secondary | ICD-10-CM

## 2016-07-18 DIAGNOSIS — R609 Edema, unspecified: Secondary | ICD-10-CM

## 2016-07-18 DIAGNOSIS — C7951 Secondary malignant neoplasm of bone: Secondary | ICD-10-CM | POA: Diagnosis not present

## 2016-07-18 DIAGNOSIS — E46 Unspecified protein-calorie malnutrition: Secondary | ICD-10-CM | POA: Insufficient documentation

## 2016-07-18 DIAGNOSIS — K521 Toxic gastroenteritis and colitis: Secondary | ICD-10-CM

## 2016-07-18 DIAGNOSIS — R630 Anorexia: Secondary | ICD-10-CM

## 2016-07-18 DIAGNOSIS — M7989 Other specified soft tissue disorders: Secondary | ICD-10-CM

## 2016-07-18 LAB — CBC & DIFF AND RETIC
BASO%: 0 % (ref 0.0–2.0)
Basophils Absolute: 0 10*3/uL (ref 0.0–0.1)
EOS ABS: 0 10*3/uL (ref 0.0–0.5)
EOS%: 0 % (ref 0.0–7.0)
HEMATOCRIT: 32.1 % — AB (ref 34.8–46.6)
HEMOGLOBIN: 10.4 g/dL — AB (ref 11.6–15.9)
IMMATURE RETIC FRACT: 7.8 % (ref 1.60–10.00)
LYMPH#: 0.6 10*3/uL — AB (ref 0.9–3.3)
LYMPH%: 11.6 % — AB (ref 14.0–49.7)
MCH: 27.7 pg (ref 25.1–34.0)
MCHC: 32.4 g/dL (ref 31.5–36.0)
MCV: 85.4 fL (ref 79.5–101.0)
MONO#: 0.4 10*3/uL (ref 0.1–0.9)
MONO%: 7.4 % (ref 0.0–14.0)
NEUT#: 3.8 10*3/uL (ref 1.5–6.5)
NEUT%: 81 % — AB (ref 38.4–76.8)
PLATELETS: 125 10*3/uL — AB (ref 145–400)
RBC: 3.76 10*6/uL (ref 3.70–5.45)
RDW: 15.3 % — ABNORMAL HIGH (ref 11.2–14.5)
RETIC CT ABS: 37.98 10*3/uL (ref 33.70–90.70)
Retic %: 1.01 % (ref 0.70–2.10)
WBC: 4.7 10*3/uL (ref 3.9–10.3)

## 2016-07-18 LAB — COMPREHENSIVE METABOLIC PANEL
ALK PHOS: 90 U/L (ref 40–150)
ALT: 28 U/L (ref 0–55)
AST: 37 U/L — ABNORMAL HIGH (ref 5–34)
Albumin: 2.6 g/dL — ABNORMAL LOW (ref 3.5–5.0)
Anion Gap: 8 mEq/L (ref 3–11)
BILIRUBIN TOTAL: 0.51 mg/dL (ref 0.20–1.20)
BUN: 15.8 mg/dL (ref 7.0–26.0)
CO2: 23 mEq/L (ref 22–29)
Calcium: 7.9 mg/dL — ABNORMAL LOW (ref 8.4–10.4)
Chloride: 111 mEq/L — ABNORMAL HIGH (ref 98–109)
Creatinine: 0.8 mg/dL (ref 0.6–1.1)
EGFR: 74 mL/min/{1.73_m2} — AB (ref >90)
GLUCOSE: 98 mg/dL (ref 70–140)
Potassium: 3.3 mEq/L — ABNORMAL LOW (ref 3.5–5.1)
Sodium: 142 mEq/L (ref 136–145)
Total Protein: 6 g/dL — ABNORMAL LOW (ref 6.4–8.3)

## 2016-07-18 MED ORDER — SODIUM CHLORIDE 0.9% FLUSH
10.0000 mL | Freq: Once | INTRAVENOUS | Status: AC
  Administered 2016-07-18: 10 mL via INTRAVENOUS
  Filled 2016-07-18: qty 10

## 2016-07-18 MED ORDER — SODIUM CHLORIDE 0.9 % IV SOLN
Freq: Once | INTRAVENOUS | Status: AC
  Administered 2016-07-18: 11:00:00 via INTRAVENOUS
  Filled 2016-07-18: qty 1000

## 2016-07-18 MED ORDER — SODIUM CHLORIDE 0.9 % IV SOLN
8.0000 mg | Freq: Once | INTRAVENOUS | Status: AC
  Administered 2016-07-18: 8 mg via INTRAVENOUS
  Filled 2016-07-18: qty 4

## 2016-07-18 MED ORDER — HEPARIN SOD (PORK) LOCK FLUSH 100 UNIT/ML IV SOLN
500.0000 [IU] | Freq: Once | INTRAVENOUS | Status: AC
  Administered 2016-07-18: 500 [IU] via INTRAVENOUS
  Filled 2016-07-18: qty 5

## 2016-07-18 NOTE — Assessment & Plan Note (Signed)
Albumin has slightly improved from 2.4 up to 2.6.  Patient was encouraged to continue pushing protein in her diet is much as possible.

## 2016-07-18 NOTE — Telephone Encounter (Signed)
NO INFORMATION IN 81/14 LOS

## 2016-07-18 NOTE — Assessment & Plan Note (Signed)
Potassium is greatly improved from 2.3 up to 3.3.  Patient received IV potassium while at the [facility] on Friday, 07/15/2016.  She has been taking potassium 20 mEq twice daily over the weekend.  She states she has had no further nausea, vomiting, or diarrhea.  Over the weekend.  She states she is feeling much better today.  Potassium 20 mEq IV will be given to the patient with her IV fluid rehydration today.  She was advised to change the potassium tablet she already has at home to 20 mEq only once per day until gone.

## 2016-07-18 NOTE — Patient Instructions (Signed)

## 2016-07-18 NOTE — Assessment & Plan Note (Signed)
Patient has a history of chronic peripheral edema to her bilateral lower extremities; with the left slightly greater than right.  Bilateral Doppler ultrasound obtained on 06/28/2016 was negative for DVT.  Patient was advised to continue to elevate her feet above the level of for heart when ever she is at rest; and to try compression stockings as well.  She was encouraged to remain as active as possible as well.

## 2016-07-18 NOTE — Assessment & Plan Note (Signed)
Potassium is down to 2.3 today.  Patient states that she has been taking potassium tablets; but she completed them.  Most likely, patient's potassium is low due to diarrhea, nausea/vomiting, and dehydration.  Patient will receive potassium 20 mEq IV; and 20 mEq orally while at the cancer Center today.  She will also be given a prescription for potassium 20 mEq to be taken twice a day.  We'll recheck levels.  When patient returns on Monday.

## 2016-07-18 NOTE — Addendum Note (Signed)
Addended by: Aura Fey A on: 07/18/2016 01:25 PM   Modules accepted: Orders

## 2016-07-18 NOTE — Assessment & Plan Note (Signed)
Patient states that she has had no further nausea or vomiting.  Since this past Friday, 07/15/2016.  She states that she's been taking only intermittent Zofran at home.  She states she's been taking and a clear liquid diet and some bland foods only; but states that she is feeling much better and stronger.  She is requesting to receive IV fluid rehydration.  Once again today; she stated that it does help her feel much better.

## 2016-07-18 NOTE — Assessment & Plan Note (Signed)
Patient continues with bilateral lower extremity edema; but today the right lower extremity is slightly larger than the left.  Patient underwent a Doppler ultrasound to bilateral lower extremities on 06/28/2016 which was negative.

## 2016-07-18 NOTE — Progress Notes (Signed)
SYMPTOM MANAGEMENT CLINIC    Chief Complaint: Nausea, vomiting, diarrhea, dehydration  HPI:  Cindy Byrd 75 y.o. female diagnosed with lung cancer; with bone metastasis.  Patient is status post radiation treatments, nivolumab immunotherapy, and is currently receiving Xgeva injections for bone metastasis.   Patient persisted the cancer Center today with complaint of nausea, vomiting, diarrhea, and dehydration.   No history exists.    Review of Systems  Constitutional: Positive for malaise/fatigue and weight loss.  Gastrointestinal: Positive for diarrhea, nausea and vomiting. Negative for abdominal pain.  All other systems reviewed and are negative.   Past Medical History:  Diagnosis Date  . Barrett's esophagus   . Bone neoplasm 06/24/2015  . Cancer [facility])    metastatic poorly differentiated carcinoma. tumor left groin surgical removal with radiation tx.  . Cataract    BILATERAL  . Cigarette smoker two packs a day or less    Currently still smoking 2 PPD - Not interested in quitting at this time.  . Colon polyps    hyperplastic, tubular adenomas, tubulovillous adenoma  . Cough, persistent    hx. lung cancer ? primary-being evaluated, unsure of primary site.  . Depression 06/24/2015  . Diverticulosis   . Emphysema of lung ([place])   . Endometriosis    Hysterectomy with BSO at age 19 yrs  . Esophageal adenocarcinoma ([place]) 08/11/15   intramucosal  . Gastritis   . GERD (gastroesophageal reflux disease)   . H/O: pneumonia   . Heavy smoker (more than 20 cigarettes per day) 06/24/2015  . Hiatal hernia   . Hyperlipidemia   . Hypertension 06/24/2015   likely improved incidental to 40 lbs weight loss from her neoplasm. No Longer taking med for this as of 08-06-15  . IBS (irritable bowel syndrome)   . Pain    left hip-persistent"tumor of bone"-radiation tx. 10.  . Pulmonary nodules   . Swelling of ankle    bilateral  . Vitamin D deficiency disease     Past Surgical History:    Procedure Laterality Date  . ABDOMINAL HYSTERECTOMY    . BARTHOLIN GLAND CYST EXCISION  75 yo ago   Does not want if it was an infected cyst or tumor. Was soon as delivery  . COLONOSCOPY W/ POLYPECTOMY     multiple times - last done 09/2014 per patient.  . ESOPHAGOGASTRODUODENOSCOPY (EGD) WITH PROPOFOL N/A 08/11/2015   Procedure: ESOPHAGOGASTRODUODENOSCOPY (EGD) WITH PROPOFOL;  Surgeon: [name], MD;  Location: [facility];  Service: Gastroenterology;  Laterality: N/A;  . GANGLION CYST EXCISION    . KNEE ARTHROSCOPY  age about 84 yrs  . TONSILLECTOMY    . TOTAL ABDOMINAL HYSTERECTOMY W/ BILATERAL SALPINGOOPHORECTOMY  at age 48 yrs   For endometriosis    has Bone metastasis ([place]); Neoplasm related pain; Protein calorie malnutrition ([place]); Anorexia; Heavy smoker (more than 20 cigarettes per day); Depression; HTN (hypertension); Esophageal reflux; Metastatic lung cancer (metastasis from lung to other site) [facility]); Gastritis; Barrett's esophagus determined by biopsy; Nicotine addiction; Port catheter in place; Diarrhea due to drug; Nausea with vomiting; Dehydration; Hypokalemia; Hypoalbuminemia due to protein-calorie malnutrition ([place]); and Peripheral edema on her problem list.    is allergic to penicillins; remeron [mirtazapine]; and latex.    Medication List       Accurate as of 07/15/16 11:59 PM. Always use your most recent med list.          amLODipine 5 MG tablet Commonly known as:  NORVASC TAKE 1 TABLET (5  MG TOTAL) BY MOUTH DAILY.   artificial tears Oint ophthalmic ointment Place into both eyes at bedtime.   citalopram 20 MG tablet Commonly known as:  CELEXA Take 1 tablet (20 mg total) by mouth daily.   clotrimazole-betamethasone cream Commonly known as:  LOTRISONE Apply 1 application topically 2 (two) times daily. Apply to perianal skin twice daily.   diphenoxylate-atropine 2.5-0.025 MG tablet Commonly known as:  LOMOTIL Take 1-2 tablets by mouth 4 (four) times  daily as needed for diarrhea or loose stools.   docusate sodium 100 MG capsule Commonly known as:  COLACE Take 100 mg by mouth daily as needed for mild constipation. Reported on 02/23/2016   Hypromellose 0.4 % Soln Commonly known as:  ARTIFICIAL TEARS Apply 1 drop to eye QID.   lactobacillus acidophilus & bulgar chewable tablet Chew 1 tablet by mouth 3 (three) times daily with meals.   lidocaine-prilocaine cream Commonly known as:  EMLA Apply small amount over port 1-2 hours prior to treatment, cover with plastic wrap (DO NOT RUB IN).   LORazepam 1 MG tablet Commonly known as:  ATIVAN Take 1 tablet (1 mg total) by mouth every 8 (eight) hours as needed for anxiety (or nausea).   mesalamine 1.2 g EC tablet Commonly known as:  LIALDA Take 2 tablets (2.4 g total) by mouth daily with breakfast.   NICOTINE STEP 1 21 mg/24hr patch Generic drug:  nicotine PLACE 1 PATCH (21 MG TOTAL) ONTO THE SKIN DAILY.   omeprazole 40 MG capsule Commonly known as:  PRILOSEC TAKE ONE CAPSULE BY MOUTH EVERY DAY *NEEDS OFFICE VISIT FOR FURTHER REFILL*   ondansetron 8 MG tablet Commonly known as:  ZOFRAN Take 1 tablet (8 mg total) by mouth every 8 (eight) hours as needed for nausea or vomiting.   oxyCODONE-acetaminophen 5-325 MG tablet Commonly known as:  PERCOCET/ROXICET Take 1 tablet by mouth every 4 (four) hours as needed for severe pain.   polycarbophil 625 MG tablet Commonly known as:  FIBERCON Take 2 tablets (1,250 mg total) by mouth 2 (two) times daily.   potassium chloride SA 20 MEQ tablet Commonly known as:  K-DUR,KLOR-CON Take 1 tablet (20 mEq total) by mouth 2 (two) times daily.   predniSONE 20 MG tablet Commonly known as:  DELTASONE Take 1 tablet (20 mg total) by mouth daily with breakfast.   PRESCRIPTION MEDICATION Antibody Plan [facility]   valsartan 80 MG tablet Commonly known as:  DIOVAN   zolpidem 12.5 MG CR tablet Commonly known as:  AMBIEN CR Take 1 tablet (12.5 mg total)  by mouth at bedtime as needed for sleep.        PHYSICAL EXAMINATION  Oncology Vitals 07/18/2016 07/15/2016  Height 170 cm 170 cm  Weight 64.638 kg 76.885 kg  Weight (lbs) 142 lbs 8 oz 169 lbs 8 oz  BMI (kg/m2) 22.32 kg/m2 26.55 kg/m2  Temp 98.3 97.7  Pulse 70 54  Resp 18 18  SpO2 98 100  BSA (m2) 1.75 m2 1.91 m2   BP Readings from Last 2 Encounters:  07/18/16 (!) 150/77  07/15/16 (!) 151/71    Physical Exam  Constitutional: Vital signs are normal. She appears malnourished and dehydrated. She appears unhealthy. She appears cachectic.  HENT:  Head: Normocephalic and atraumatic.  Mouth/Throat: Oropharynx is clear and moist.  Eyes: Conjunctivae and EOM are normal. Pupils are equal, round, and reactive to light. Right eye exhibits no discharge. Left eye exhibits no discharge. No scleral icterus.  Neck: Normal range of motion.  Neck supple. No JVD present. No tracheal deviation present. No thyromegaly present.  Cardiovascular: Normal rate, regular rhythm, normal heart sounds and intact distal pulses.   Pulmonary/Chest: Effort normal and breath sounds normal. No respiratory distress. She has no wheezes. She has no rales. She exhibits no tenderness.  Abdominal: Soft. Bowel sounds are normal. She exhibits no distension and no mass. There is no tenderness. There is no rebound and no guarding.  Musculoskeletal: Normal range of motion. She exhibits edema. She exhibits no tenderness.  Mild bilateral lower extremity edema; with the left slightly greater than right as baseline.  Lymphadenopathy:    She has no cervical adenopathy.  Neurological: She is alert. Gait normal.  Skin: Skin is warm and dry. No rash noted. No erythema. There is pallor.  Psychiatric: Affect normal.  Nursing note and vitals reviewed.   LABORATORY DATA:. Appointment on 07/15/2016  Component Date Value Ref Range Status  . WBC 07/15/2016 4.9  3.9 - 10.3 10e3/uL Final  . NEUT# 07/15/2016 3.3  1.5 - 6.5 10e3/uL Final    . HGB 07/15/2016 12.0  11.6 - 15.9 g/dL Final  . HCT 07/15/2016 37.1  34.8 - 46.6 % Final  . Platelets 07/15/2016 105* 145 - 400 10e3/uL Final  . MCV 07/15/2016 84.6  79.5 - 101.0 fL Final  . MCH 07/15/2016 27.4  25.1 - 34.0 pg Final  . MCHC 07/15/2016 32.4  31.5 - 36.0 g/dL Final  . RBC 07/15/2016 4.39  3.70 - 5.45 10e6/uL Final  . RDW 07/15/2016 15.4* 11.2 - 14.5 % Final  . lymph# 07/15/2016 1.2  0.9 - 3.3 10e3/uL Final  . MONO# 07/15/2016 0.4  0.1 - 0.9 10e3/uL Final  . Eosinophils Absolute 07/15/2016 0.0  0.0 - 0.5 10e3/uL Final  . Basophils Absolute 07/15/2016 0.0  0.0 - 0.1 10e3/uL Final  . NEUT% 07/15/2016 67.5  38.4 - 76.8 % Final  . LYMPH% 07/15/2016 23.5  14.0 - 49.7 % Final  . MONO% 07/15/2016 8.1  0.0 - 14.0 % Final  . EOS% 07/15/2016 0.5  0.0 - 7.0 % Final  . BASO% 07/15/2016 0.4  0.0 - 2.0 % Final  . Sodium 07/15/2016 138  136 - 145 mEq/L Final  . Potassium 07/15/2016 2.3* 3.5 - 5.1 mEq/L Final  . Chloride 07/15/2016 105  98 - 109 mEq/L Final  . CO2 07/15/2016 22  22 - 29 mEq/L Final  . Glucose 07/15/2016 108  70 - 140 mg/dl Final  . BUN 07/15/2016 30.7* 7.0 - 26.0 mg/dL Final  . Creatinine 07/15/2016 1.4* 0.6 - 1.1 mg/dL Final  . Total Bilirubin 07/15/2016 0.41  0.20 - 1.20 mg/dL Final  . Alkaline Phosphatase 07/15/2016 90  40 - 150 U/L Final  . AST 07/15/2016 43* 5 - 34 U/L Final  . ALT 07/15/2016 28  0 - 55 U/L Final  . Total Protein 07/15/2016 5.8* 6.4 - 8.3 g/dL Final  . Albumin 07/15/2016 2.4* 3.5 - 5.0 g/dL Final  . Calcium 07/15/2016 7.8* 8.4 - 10.4 mg/dL Final  . Anion Gap 07/15/2016 11  3 - 11 mEq/L Final  . EGFR 07/15/2016 38* >90 ml/min/1.73 m2 Final  . Magnesium 07/15/2016 1.8  1.5 - 2.5 mg/dl Final    RADIOGRAPHIC STUDIES: No results found.  ASSESSMENT/PLAN:    Peripheral edema Patient has a history of chronic peripheral edema to her bilateral lower extremities; with the left slightly greater than right.  Bilateral Doppler ultrasound obtained on  06/28/2016 was negative for DVT.  Patient  was advised to continue to elevate her feet above the level of for heart when ever she is at rest; and to try compression stockings as well.  She was encouraged to remain as active as possible as well.  Nausea with vomiting Patient complains of chronic nausea and intermittent vomiting.  She has been taking Zofran home with only minimal effectiveness.  She feels dehydrated today.  She will receive IV fluid rehydration while at the [facility]; and will also be given Zofran IV.  Patient was given a refill of Zofran today as well.  Metastatic lung cancer (metastasis from lung to other site) [facility]) Patient is currently undergoing observation only.  She had received nivolumab immunotherapy; but this was discontinued due to worsening issues with diarrhea.  Patient continues with her monthly Xgeva injections for bone metastasis.  Her last Xgeva injection was 07/07/2016.  Patient will be scheduled to return for labs, a symptom management clinic visit, and probable IV fluid rehydration again on Monday, 07/18/2016.  She is scheduled for labs, visit, and her next Xgeva injection on 07/26/2016.  Hypokalemia Potassium is down to 2.3 today.  Patient states that she has been taking potassium tablets; but she completed them.  Most likely, patient's potassium is low due to diarrhea, nausea/vomiting, and dehydration.  Patient will receive potassium 20 mEq IV; and 20 mEq orally while at the cancer Center today.  She will also be given a prescription for potassium 20 mEq to be taken twice a day.  We'll recheck levels.  When patient returns on Monday.  Hypoalbuminemia due to protein-calorie malnutrition ([facility]) Albumin continues to decrease.  Patient was encouraged to push protein in her diet is much as possible.  Diarrhea due to drug Patient experienced a great deal of diarrhea while undergoing her nivolumab immunotherapy; and the nivolumab was held due to this chronic  diarrhea. She also has a history of chronic inflammatory colitis/IBS as well.  She is followed by Dr. [name] gastroenterologist for the management of her inflammatory colitis  Patient states that she continues with issues of significant diarrhea; stating that sometimes she is leaking stool and is currently wearing adult diapers.  Patient continues to alternate both the Imodium and Lomotil as directed.  Patient appears dehydrated today; and will receive IV fluid rehydration.  Also had a long discussion with the patient and her family members regarding the need for a clear liquid diet until both her nausea/vomiting and diarrhea clear.      Dehydration Patient presents to the [facility] today with complaint of nausea, vomiting, diarrhea.  She feels very dehydrated today.  She will receive IV fluid rehydration while at the [facility].  She will also return to the [facility] for repeat labs and probable IV fluid rehydration on Monday, 07/18/2016.  She was encouraged to push fluids over the weekend is much as possible.  Anorexia Patient has been experiencing chronic nausea, vomiting, diarrhea, and dehydration.  She has had little appetite and states that she's lost approximately 10 pounds recently.  Patient was encouraged to push protein in her diet and eat multiple small distal pulses.   Patient stated understanding of all instructions; and was in agreement with this plan of care. The patient knows to call the clinic with any problems, questions or concerns.   Total time spent with patient was 40 minutes;  with greater than 75 percent of that time spent in face to face counseling regarding patient's symptoms,  and coordination of care and follow up.  Disclaimer:This dictation was prepared with Dragon/digital dictation along with [employer]. Any transcriptional errors that result from this process are unintentional.  Drue Second, NP 07/18/2016

## 2016-07-18 NOTE — Assessment & Plan Note (Signed)
Albumin continues to decrease.  Patient was encouraged to push protein in her diet is much as possible.

## 2016-07-18 NOTE — Assessment & Plan Note (Addendum)
Patient complains of chronic nausea and intermittent vomiting.  She has been taking Zofran home with only minimal effectiveness.  She feels dehydrated today.  She will receive IV fluid rehydration while at the [facility]; and will also be given Zofran IV.  Patient was given a refill of Zofran today as well.

## 2016-07-18 NOTE — Assessment & Plan Note (Addendum)
Patient states that she has been trying to push both fluids and eat a mild, bland diet.  She is feeling some better today.

## 2016-07-18 NOTE — Assessment & Plan Note (Signed)
Patient experienced a great deal of diarrhea while undergoing her nivolumab immunotherapy; and the nivolumab was held due to this chronic diarrhea. She also has a history of chronic inflammatory colitis/IBS as well.  She is followed by Dr. [name] gastroenterologist for the management of her inflammatory colitis  Patient states that she continues with issues of significant diarrhea; stating that sometimes she is leaking stool and is currently wearing adult diapers.  Patient continues to alternate both the Imodium and Lomotil as directed.  Patient appears dehydrated today; and will receive IV fluid rehydration.  Also had a long discussion with the patient and her family members regarding the need for a clear liquid diet until both her nausea/vomiting and diarrhea clear.

## 2016-07-18 NOTE — Assessment & Plan Note (Signed)
Patient states that all her nausea, vomiting, and diarrhea have completely resolved over this past weekend.  She states that she still feels slightly dehydrated and is requesting additional IV fluid rehydration.  Patient will receive IV Zofran per her request as well.  She was encouraged to continue pushing fluids is much as possible.

## 2016-07-18 NOTE — Progress Notes (Signed)
SYMPTOM MANAGEMENT CLINIC    Chief Complaint: Nausea, vomiting, diarrhea, dehydration  HPI:  Cindy Byrd 75 y.o. female diagnosed with lung cancer; with bone metastasis.  Patient is status post radiation treatments, nivolumab immunotherapy, and is currently receiving Xgeva injections for bone metastasis.   Patient persisted the cancer Center today with complaint of nausea, vomiting, diarrhea, and dehydration.   No history exists.    Review of Systems  Constitutional: Positive for malaise/fatigue and weight loss.  Gastrointestinal: Negative for abdominal pain, nausea and vomiting.  All other systems reviewed and are negative.   Past Medical History:  Diagnosis Date  . Barrett's esophagus   . Bone neoplasm 06/24/2015  . Cancer [facility])    metastatic poorly differentiated carcinoma. tumor left groin surgical removal with radiation tx.  . Cataract    BILATERAL  . Cigarette smoker two packs a day or less    Currently still smoking 2 PPD - Not interested in quitting at this time.  . Colon polyps    hyperplastic, tubular adenomas, tubulovillous adenoma  . Cough, persistent    hx. lung cancer ? primary-being evaluated, unsure of primary site.  . Depression 06/24/2015  . Diverticulosis   . Emphysema of lung ([place])   . Endometriosis    Hysterectomy with BSO at age 62 yrs  . Esophageal adenocarcinoma ([place]) 08/11/15   intramucosal  . Gastritis   . GERD (gastroesophageal reflux disease)   . H/O: pneumonia   . Heavy smoker (more than 20 cigarettes per day) 06/24/2015  . Hiatal hernia   . Hyperlipidemia   . Hypertension 06/24/2015   likely improved incidental to 40 lbs weight loss from her neoplasm. No Longer taking med for this as of 08-06-15  . IBS (irritable bowel syndrome)   . Pain    left hip-persistent"tumor of bone"-radiation tx. 10.  . Pulmonary nodules   . Swelling of ankle    bilateral  . Vitamin D deficiency disease     Past Surgical History:  Procedure Laterality  Date  . ABDOMINAL HYSTERECTOMY    . BARTHOLIN GLAND CYST EXCISION  75 yo ago   Does not want if it was an infected cyst or tumor. Was soon as delivery  . COLONOSCOPY W/ POLYPECTOMY     multiple times - last done 09/2014 per patient.  . ESOPHAGOGASTRODUODENOSCOPY (EGD) WITH PROPOFOL N/A 08/11/2015   Procedure: ESOPHAGOGASTRODUODENOSCOPY (EGD) WITH PROPOFOL;  Surgeon: [name], MD;  Location: [facility];  Service: Gastroenterology;  Laterality: N/A;  . GANGLION CYST EXCISION    . KNEE ARTHROSCOPY  age about 34 yrs  . TONSILLECTOMY    . TOTAL ABDOMINAL HYSTERECTOMY W/ BILATERAL SALPINGOOPHORECTOMY  at age 58 yrs   For endometriosis    has Bone metastasis ([place]); Neoplasm related pain; Protein calorie malnutrition ([place]); Anorexia; Heavy smoker (more than 20 cigarettes per day); Depression; HTN (hypertension); Esophageal reflux; Metastatic lung cancer (metastasis from lung to other site) [facility]); Gastritis; Barrett's esophagus determined by biopsy; Nicotine addiction; Port catheter in place; Diarrhea due to drug; Nausea with vomiting; Dehydration; Hypokalemia; Hypoalbuminemia due to protein-calorie malnutrition ([place]); and Peripheral edema on her problem list.    is allergic to penicillins; remeron [mirtazapine]; and latex.    Medication List       Accurate as of 07/18/16 10:40 AM. Always use your most recent med list.          amLODipine 5 MG tablet Commonly known as:  NORVASC TAKE 1 TABLET (5 MG TOTAL) BY MOUTH  DAILY.   artificial tears Oint ophthalmic ointment Place into both eyes at bedtime.   citalopram 20 MG tablet Commonly known as:  CELEXA Take 1 tablet (20 mg total) by mouth daily.   clotrimazole-betamethasone cream Commonly known as:  LOTRISONE Apply 1 application topically 2 (two) times daily. Apply to perianal skin twice daily.   diphenoxylate-atropine 2.5-0.025 MG tablet Commonly known as:  LOMOTIL Take 1-2 tablets by mouth 4 (four) times daily as needed for  diarrhea or loose stools.   docusate sodium 100 MG capsule Commonly known as:  COLACE Take 100 mg by mouth daily as needed for mild constipation. Reported on 02/23/2016   Hypromellose 0.4 % Soln Commonly known as:  ARTIFICIAL TEARS Apply 1 drop to eye QID.   lactobacillus acidophilus & bulgar chewable tablet Chew 1 tablet by mouth 3 (three) times daily with meals.   lidocaine-prilocaine cream Commonly known as:  EMLA Apply small amount over port 1-2 hours prior to treatment, cover with plastic wrap (DO NOT RUB IN).   LORazepam 1 MG tablet Commonly known as:  ATIVAN Take 1 tablet (1 mg total) by mouth every 8 (eight) hours as needed for anxiety (or nausea).   mesalamine 1.2 g EC tablet Commonly known as:  LIALDA Take 2 tablets (2.4 g total) by mouth daily with breakfast.   NICOTINE STEP 1 21 mg/24hr patch Generic drug:  nicotine PLACE 1 PATCH (21 MG TOTAL) ONTO THE SKIN DAILY.   omeprazole 40 MG capsule Commonly known as:  PRILOSEC TAKE ONE CAPSULE BY MOUTH EVERY DAY *NEEDS OFFICE VISIT FOR FURTHER REFILL*   ondansetron 8 MG tablet Commonly known as:  ZOFRAN Take 1 tablet (8 mg total) by mouth every 8 (eight) hours as needed for nausea or vomiting.   oxyCODONE-acetaminophen 5-325 MG tablet Commonly known as:  PERCOCET/ROXICET Take 1 tablet by mouth every 4 (four) hours as needed for severe pain.   polycarbophil 625 MG tablet Commonly known as:  FIBERCON Take 2 tablets (1,250 mg total) by mouth 2 (two) times daily.   potassium chloride SA 20 MEQ tablet Commonly known as:  K-DUR,KLOR-CON Take 1 tablet (20 mEq total) by mouth 2 (two) times daily.   predniSONE 20 MG tablet Commonly known as:  DELTASONE Take 1 tablet (20 mg total) by mouth daily with breakfast.   PRESCRIPTION MEDICATION Antibody Plan [facility]   valsartan 80 MG tablet Commonly known as:  DIOVAN   zolpidem 12.5 MG CR tablet Commonly known as:  AMBIEN CR Take 1 tablet (12.5 mg total) by mouth at bedtime  as needed for sleep.        PHYSICAL EXAMINATION  Oncology Vitals 07/18/2016 07/15/2016  Height 170 cm 170 cm  Weight 64.638 kg 76.885 kg  Weight (lbs) 142 lbs 8 oz 169 lbs 8 oz  BMI (kg/m2) 22.32 kg/m2 26.55 kg/m2  Temp 98.3 97.7  Pulse 70 54  Resp 18 18  SpO2 98 100  BSA (m2) 1.75 m2 1.91 m2   BP Readings from Last 2 Encounters:  07/18/16 (!) 150/77  07/15/16 (!) 151/71    Physical Exam  Constitutional: Vital signs are normal. She appears malnourished and dehydrated. She appears unhealthy. She appears cachectic.  HENT:  Head: Normocephalic and atraumatic.  Mouth/Throat: Oropharynx is clear and moist.  Eyes: Conjunctivae and EOM are normal. Pupils are equal, round, and reactive to light. Right eye exhibits no discharge. Left eye exhibits no discharge. No scleral icterus.  Neck: Normal range of motion. Neck supple. No JVD  present. No tracheal deviation present. No thyromegaly present.  Cardiovascular: Normal rate, regular rhythm, normal heart sounds and intact distal pulses.   Pulmonary/Chest: Effort normal and breath sounds normal. No respiratory distress. She has no wheezes. She has no rales. She exhibits no tenderness.  Abdominal: Soft. Bowel sounds are normal. She exhibits no distension and no mass. There is no tenderness. There is no rebound and no guarding.  Musculoskeletal: Normal range of motion. She exhibits edema. She exhibits no tenderness.  Mild bilateral lower extremity edema; with the left slightly greater than right as baseline.  Lymphadenopathy:    She has no cervical adenopathy.  Neurological: She is alert. Gait normal.  Skin: Skin is warm and dry. No rash noted. No erythema. There is pallor.  Psychiatric: Affect normal.  Nursing note and vitals reviewed.   LABORATORY DATA:. Appointment on 07/18/2016  Component Date Value Ref Range Status  . WBC 07/18/2016 4.7  3.9 - 10.3 10e3/uL Final  . NEUT# 07/18/2016 3.8  1.5 - 6.5 10e3/uL Final  . HGB 07/18/2016  10.4* 11.6 - 15.9 g/dL Final  . HCT 07/18/2016 32.1* 34.8 - 46.6 % Final  . Platelets 07/18/2016 125* 145 - 400 10e3/uL Final  . MCV 07/18/2016 85.4  79.5 - 101.0 fL Final  . MCH 07/18/2016 27.7  25.1 - 34.0 pg Final  . MCHC 07/18/2016 32.4  31.5 - 36.0 g/dL Final  . RBC 07/18/2016 3.76  3.70 - 5.45 10e6/uL Final  . RDW 07/18/2016 15.3* 11.2 - 14.5 % Final  . lymph# 07/18/2016 0.6* 0.9 - 3.3 10e3/uL Final  . MONO# 07/18/2016 0.4  0.1 - 0.9 10e3/uL Final  . Eosinophils Absolute 07/18/2016 0.0  0.0 - 0.5 10e3/uL Final  . Basophils Absolute 07/18/2016 0.0  0.0 - 0.1 10e3/uL Final  . NEUT% 07/18/2016 81.0* 38.4 - 76.8 % Final  . LYMPH% 07/18/2016 11.6* 14.0 - 49.7 % Final  . MONO% 07/18/2016 7.4  0.0 - 14.0 % Final  . EOS% 07/18/2016 0.0  0.0 - 7.0 % Final  . BASO% 07/18/2016 0.0  0.0 - 2.0 % Final  . Retic % 07/18/2016 1.01  0.70 - 2.10 % Final  . Retic Ct Abs 07/18/2016 37.98  33.70 - 90.70 10e3/uL Final  . Immature Retic Fract 07/18/2016 7.80  1.60 - 10.00 % Final  . Sodium 07/18/2016 142  136 - 145 mEq/L Final  . Potassium 07/18/2016 3.3* 3.5 - 5.1 mEq/L Final  . Chloride 07/18/2016 111* 98 - 109 mEq/L Final  . CO2 07/18/2016 23  22 - 29 mEq/L Final  . Glucose 07/18/2016 98  70 - 140 mg/dl Final  . BUN 07/18/2016 15.8  7.0 - 26.0 mg/dL Final  . Creatinine 07/18/2016 0.8  0.6 - 1.1 mg/dL Final  . Total Bilirubin 07/18/2016 0.51  0.20 - 1.20 mg/dL Final  . Alkaline Phosphatase 07/18/2016 90  40 - 150 U/L Final  . AST 07/18/2016 37* 5 - 34 U/L Final  . ALT 07/18/2016 28  0 - 55 U/L Final  . Total Protein 07/18/2016 6.0* 6.4 - 8.3 g/dL Final  . Albumin 07/18/2016 2.6* 3.5 - 5.0 g/dL Final  . Calcium 07/18/2016 7.9* 8.4 - 10.4 mg/dL Final  . Anion Gap 07/18/2016 8  3 - 11 mEq/L Final  . EGFR 07/18/2016 74* >90 ml/min/1.73 m2 Final    RADIOGRAPHIC STUDIES: No results found.  ASSESSMENT/PLAN:    Peripheral edema Patient continues with bilateral lower extremity edema; but today the  right lower extremity is slightly  larger than the left.  Patient underwent a Doppler ultrasound to bilateral lower extremities on 06/28/2016 which was negative.  Nausea with vomiting Patient states that she has had no further nausea or vomiting.  Since this past Friday, 07/15/2016.  She states that she's been taking only intermittent Zofran at home.  She states she's been taking and a clear liquid diet and some bland foods only; but states that she is feeling much better and stronger.  She is requesting to receive IV fluid rehydration.  Once again today; she stated that it does help her feel much better.  Metastatic lung cancer (metastasis from lung to other site) [facility]) Patient is currently undergoing observation only.  She had received nivolumab immunotherapy; but this was discontinued due to worsening issues with diarrhea.  Patient continues with her monthly Xgeva injections for bone metastasis.  Her last Xgeva injection was 07/07/2016.  Patient is scheduled for labs, visit, and her next Xgeva injection on 07/26/2016.  Hypokalemia Potassium is greatly improved from 2.3 up to 3.3.  Patient received IV potassium while at the [facility] on Friday, 07/15/2016.  She has been taking potassium 20 mEq twice daily over the weekend.  She states she has had no further nausea, vomiting, or diarrhea.  Over the weekend.  She states she is feeling much better today.  Potassium 20 mEq IV will be given to the patient with her IV fluid rehydration today.  She was advised to change the potassium tablet she already has at home to 20 mEq only once per day until gone.  Hypoalbuminemia due to protein-calorie malnutrition ([facility]CC) Albumin has slightly improved from 2.4 up to 2.6.  Patient was encouraged to continue pushing protein in her diet is much as possible.  Diarrhea due to drug Patient states that all of her diarrhea has completely subsided over this past weekend.  She states she is taking no further Lomotil  or Imodium whatsoever.  She states she is feeling much better today.  However, patient is requesting additional IV fluid rehydration today.  Since she still feels slightly dehydrated.  Also, patient is followed closely by her gastroenterologist, Dr. [name].  Dehydration Patient states that all her nausea, vomiting, and diarrhea have completely resolved over this past weekend.  She states that she still feels slightly dehydrated and is requesting additional IV fluid rehydration.  Patient will receive IV Zofran per her request as well.  She was encouraged to continue pushing fluids is much as possible.  Anorexia Patient states that she has been trying to push both fluids and   Patient stated understanding of all instructions; and was in agreement with this plan of care. The patient knows to call the clinic with any problems, questions or concerns.   Total time spent with patient was 25 minutes;  with greater than 75 percent of that time spent in face to face counseling regarding patient's symptoms,  and coordination of care and follow up.  Disclaimer:This dictation was prepared with Dragon/digital dictation along with [employer]. Any transcriptional errors that result from this process are unintentional.  Drue Second, NP 07/18/2016

## 2016-07-18 NOTE — Assessment & Plan Note (Signed)
Patient states that all of her diarrhea has completely subsided over this past weekend.  She states she is taking no further Lomotil or Imodium whatsoever.  She states she is feeling much better today.  However, patient is requesting additional IV fluid rehydration today.  Since she still feels slightly dehydrated.  Also, patient is followed closely by her gastroenterologist, Dr. [name].

## 2016-07-18 NOTE — Assessment & Plan Note (Signed)
Patient has been experiencing chronic nausea, vomiting, diarrhea, and dehydration.  She has had little appetite and states that she's lost approximately 10 pounds recently.  Patient was encouraged to push protein in her diet and eat multiple small distal pulses.

## 2016-07-18 NOTE — Patient Instructions (Signed)
Dispositivo de perfusin implantable: gua para Engineer, mining (Implanted [employer]) Un dispositivo de perfusin implantable es un tipo de va central que se coloca debajo de la piel. Las vas centrales se usan para proporcionar un Diplomatic Services operational officer (IV) cuando es necesario administrarle un tratamiento o nutricin a Ardelia Mems persona a travs de las venas. Los dispositivos de perfusin implantables se usan para proporcionar un acceso intravenoso a Barrister's clerk. Un dispositivo de perfusin implantable se puede colocar por los siguientes motivos:   Necesita administrarse un medicamento intravenoso que podra provocar una irritacin en las venas pequeas de las manos o los brazos.  Necesita medicamentos por va intravenosa a largo plazo, como antibiticos.  Necesita recibir nutricin por va intravenosa durante un largo perodo.  Es necesario que le extraigan sangre con frecuencia para Optometrist anlisis de laboratorio.  Debe someterse a dilisis. Los dispositivos de perfusin implantables generalmente se colocan en la zona del pecho, pero tambin se pueden colocar en la parte superior del brazo, el abdomen o la pierna. Un dispositivo de perfusin implantable tiene dos partes principales:   Depsito. El depsito es redondo y Lexicographer como una zona pequea y prominente en la piel. El depsito es la parte en donde se inserta la aguja para Architectural technologist los medicamentos o Merchant navy officer.  Catter. El catter es un tubo delgado y flexible que se extiende desde el depsito. El catter se coloca en una vena grande. Los medicamentos que se introducen en el depsito, pasan a travs del catter y luego llegan a la vena. [place]? No humedezca el lugar de la incisin. Bese o dchese segn las indicaciones del mdico.  CMO SE ACCEDE AL DISPOSITIVO DE PERFUSIN? Para acceder al dispositivo debern seguirse algunos pasos especiales.   Antes del procedimiento, podr colocarse una  crema con anestesia sobre la piel. Esto hace adormecer la piel que se encuentra sobre el dispositivo.  Para acceder al dispositivo el mdico usar una tcnica estril.  El mdico debe colocarse Judene Companion y guantes estriles.  La piel alrededor del dispositivo se limpia cuidadosamente con un antisptico y se Scientist, research (life sciences).  Para introducir la aguja en el dispositivo este se debe sostener suavemente entre los guantes estriles.  Slo deben utilizarse agujas "non.coring" (que no dejan agujero) para acceder al dispositivo. Una vez que se ha accedido al dispositivo, deber controlarse el retorno de Herbalist. Esto ayuda a Forensic scientist dispositivo est en la vena y no est obstruido.  Si es necesario acceder al dispositivo de manera constante para una infusin, se colocar un apsito transparente por encima de la zona de la aguja. El apsito y la aguja debern cambiarse todas las semanas o segn las indicaciones del mdico.  Mantenga limpio y seco el apsito que cubre la [place]. No deje que se humedezca. Siga las indicaciones del mdico sobre cmo debe tomar un bao o una ducha mientras est expuesto el acceso al dispositivo.  Si no es necesario acceder al dispositivo de manera constante, no es Paramedic un apsito. QU ES EL PURGADO? El purgado ayuda a que el dispositivo no se [place]. Siga las indicaciones del mdico sobre cmo y cundo debe purgar el dispositivo. Los dispositivos de perfusin generalmente se purgan con una solucin salina o un medicamento llamado heparina. La necesidad de Corporate treasurer de cmo se use el dispositivo.   Si el dispositivo se [place] para Community education officer o extraer sangre de manera intermitente, deber purgarse de la siguiente manera:  Despus de la administracin de los medicamentos.  Luego de [place] extraccin de [place].  Como parte de una rutina de Theatre manager.  Si la infusin es constante, no necesitar limpiarlo. [place]? El dispositivo puede permanecer implantado por el tiempo que el mdico considere necesario. Cuando llega el momento de retirar el dispositivo, deber someterse a [place]. El procedimiento es similar al que se realiz para colocarlo.  [place]? Cuando tenga un dispositivo de perfusin implantado, debe buscar atencin mdica de inmediato en los siguientes casos:   Advierte un olor ftido que proviene del lugar de la incisin.  Observa hinchazn, eritemas o secrecin en el lugar de la incisin.  Observa ms hinchazn o siente ms dolor en la zona del dispositivo o a su alrededor.  Tiene fiebre que no puede controlar con [employer].   Esta informacin no tiene Marine scientist el consejo del mdico. Asegrese de hacerle al mdico cualquier pregunta que tenga.   Document Released: 09/18/2009 Document Revised: 09/11/2013 Elsevier Interactive Patient Education [employer].

## 2016-07-18 NOTE — Assessment & Plan Note (Signed)
Patient is currently undergoing observation only.  She had received nivolumab immunotherapy; but this was discontinued due to worsening issues with diarrhea.  Patient continues with her monthly Xgeva injections for bone metastasis.  Her last Xgeva injection was 07/07/2016.  Patient will be scheduled to return for labs, a symptom management clinic visit, and probable IV fluid rehydration again on Monday, 07/18/2016.  She is scheduled for labs, visit, and her next Xgeva injection on 07/26/2016.

## 2016-07-18 NOTE — Assessment & Plan Note (Signed)
Patient is currently undergoing observation only.  She had received nivolumab immunotherapy; but this was discontinued due to worsening issues with diarrhea.  Patient continues with her monthly Xgeva injections for bone metastasis.  Her last Xgeva injection was 07/07/2016.  Patient is scheduled for labs, visit, and her next Xgeva injection on 07/26/2016.

## 2016-07-18 NOTE — Assessment & Plan Note (Signed)
Patient presents to the [facility] today with complaint of nausea, vomiting, diarrhea.  She feels very dehydrated today.  She will receive IV fluid rehydration while at the [facility].  She will also return to the [facility] for repeat labs and probable IV fluid rehydration on Monday, 07/18/2016.  She was encouraged to push fluids over the weekend is much as possible.

## 2016-07-26 ENCOUNTER — Ambulatory Visit: Payer: Medicare Other

## 2016-07-26 ENCOUNTER — Other Ambulatory Visit: Payer: Self-pay | Admitting: *Deleted

## 2016-07-26 ENCOUNTER — Other Ambulatory Visit (HOSPITAL_BASED_OUTPATIENT_CLINIC_OR_DEPARTMENT_OTHER): Payer: Medicare Other

## 2016-07-26 ENCOUNTER — Ambulatory Visit (HOSPITAL_BASED_OUTPATIENT_CLINIC_OR_DEPARTMENT_OTHER): Payer: Medicare Other | Admitting: Hematology

## 2016-07-26 VITALS — BP 164/64 | HR 60 | Temp 98.4°F | Resp 17 | Ht 67.0 in | Wt 139.6 lb

## 2016-07-26 DIAGNOSIS — M7989 Other specified soft tissue disorders: Secondary | ICD-10-CM

## 2016-07-26 DIAGNOSIS — C3490 Malignant neoplasm of unspecified part of unspecified bronchus or lung: Secondary | ICD-10-CM

## 2016-07-26 DIAGNOSIS — C7951 Secondary malignant neoplasm of bone: Secondary | ICD-10-CM

## 2016-07-26 DIAGNOSIS — R197 Diarrhea, unspecified: Secondary | ICD-10-CM

## 2016-07-26 DIAGNOSIS — E860 Dehydration: Secondary | ICD-10-CM

## 2016-07-26 DIAGNOSIS — E876 Hypokalemia: Secondary | ICD-10-CM

## 2016-07-26 DIAGNOSIS — C3491 Malignant neoplasm of unspecified part of right bronchus or lung: Secondary | ICD-10-CM

## 2016-07-26 LAB — COMPREHENSIVE METABOLIC PANEL
ALT: 10 U/L (ref 0–55)
ANION GAP: 9 meq/L (ref 3–11)
AST: 16 U/L (ref 5–34)
Albumin: 2.6 g/dL — ABNORMAL LOW (ref 3.5–5.0)
Alkaline Phosphatase: 82 U/L (ref 40–150)
BUN: 9 mg/dL (ref 7.0–26.0)
CHLORIDE: 103 meq/L (ref 98–109)
CO2: 29 meq/L (ref 22–29)
CREATININE: 0.8 mg/dL (ref 0.6–1.1)
Calcium: 8.2 mg/dL — ABNORMAL LOW (ref 8.4–10.4)
EGFR: 74 mL/min/{1.73_m2} — AB (ref >90)
Glucose: 99 mg/dl (ref 70–140)
POTASSIUM: 3.7 meq/L (ref 3.5–5.1)
Sodium: 141 mEq/L (ref 136–145)
Total Bilirubin: 0.54 mg/dL (ref 0.20–1.20)
Total Protein: 6.1 g/dL — ABNORMAL LOW (ref 6.4–8.3)

## 2016-07-26 LAB — CBC & DIFF AND RETIC
BASO%: 0.5 % (ref 0.0–2.0)
Basophils Absolute: 0 10*3/uL (ref 0.0–0.1)
EOS%: 0.2 % (ref 0.0–7.0)
Eosinophils Absolute: 0 10*3/uL (ref 0.0–0.5)
HCT: 34.7 % — ABNORMAL LOW (ref 34.8–46.6)
HGB: 11 g/dL — ABNORMAL LOW (ref 11.6–15.9)
Immature Retic Fract: 6.4 % (ref 1.60–10.00)
LYMPH#: 0.8 10*3/uL — AB (ref 0.9–3.3)
LYMPH%: 11.8 % — ABNORMAL LOW (ref 14.0–49.7)
MCH: 27.7 pg (ref 25.1–34.0)
MCHC: 31.7 g/dL (ref 31.5–36.0)
MCV: 87.4 fL (ref 79.5–101.0)
MONO#: 0.3 10*3/uL (ref 0.1–0.9)
MONO%: 4.8 % (ref 0.0–14.0)
NEUT#: 5.5 10*3/uL (ref 1.5–6.5)
NEUT%: 82.7 % — AB (ref 38.4–76.8)
PLATELETS: 216 10*3/uL (ref 145–400)
RBC: 3.97 10*6/uL (ref 3.70–5.45)
RDW: 14.7 % — AB (ref 11.2–14.5)
RETIC CT ABS: 80.19 10*3/uL (ref 33.70–90.70)
Retic %: 2.02 % (ref 0.70–2.10)
WBC: 6.6 10*3/uL (ref 3.9–10.3)

## 2016-07-26 MED ORDER — POTASSIUM CHLORIDE CRYS ER 20 MEQ PO TBCR: 20.0000 meq | EXTENDED_RELEASE_TABLET | Freq: Every day | ORAL | 0 refills | Status: DC

## 2016-07-26 MED ORDER — OXYCODONE-ACETAMINOPHEN 5-325 MG PO TABS: 1.0000 | ORAL_TABLET | ORAL | 0 refills | Status: DC | PRN

## 2016-07-26 MED ORDER — DENOSUMAB 120 MG/1.7ML ~~LOC~~ SOLN: 120.0000 mg | Freq: Once | SUBCUTANEOUS | Status: DC

## 2016-07-28 NOTE — Progress Notes (Signed)
[name]    HEMATOLOGY ONCOLOGY PROGRESS NOTE  Date of service:  Patient Care Team: Cindy Roers, Cindy Byrd as PCP - General (Family Medicine) Cindy Genera, Cindy Byrd as Consulting Physician (Hematology and Oncology)  CHIEF COMPLAINTS/PURPOSE OF CONSULTATION: Follow-up for metastatic lung cancer  DIAGNOSIS:   #1 Metastatic non-small cell lung cancer with bilateral lung nodules and large metastatic lesion in the left Ilium. #2 Barrett's esophagus with some evidence of intramucosal adenocarcinoma of the esophagus. 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  Previous Treatment  1 Palliative radiation therapy to the large left ilium metastases 2. IV Nivolumab x 20ycles (discontinued due to likely immune colitis) 3. Xgeva 1244mSC q4weeks for bone metastases.   HISTORY OF PRESENTING ILLNESS: (plz see my previous consultation for details of initial presentation)  INTERVAL HISTORY  Cindy ToPixlers here for her scheduled followup.  She required evaluation and symptom management clinic and IV fluids a couple weeks ago. Notes no significant abdominal pain at this time. She reports that she stopped her mesalamine that she did not feel it was helping. Is down to 10 mg daily of prednisone and is to taper off over the next week. She is having about 2-4 semisolid bowel movements a day which she reports is her baseline with her irritable bowel syndrome. No fevers or chills. No night sweats. No other overt focal symptoms. Reports that she does not feel the same energy she had when she was on a higher dose of steroids. Breathing is stable. No new bone pains. Some bilateral leg swelling likely from the steroids for which she is on Lasix with potassium.  MEDICAL HISTORY:  Past Medical History:  Diagnosis Date  . Barrett's  esophagus   . Bone neoplasm 06/24/2015  . [facility]   metastatic poorly differentiated carcinoma. tumor left groin surgical removal with radiation tx.  . Cataract    BILATERAL  . Cigarette smoker two packs a day or less    Currently still smoking 2 PPD - Not interested in quitting at this time.  . Colon polyps    hyperplastic, tubular adenomas, tubulovillous adenoma  . Cough, persistent    hx. lung cancer ? primary-being evaluated, unsure of primary site.  . Depression 06/24/2015  . Diverticulosis   . Emphysema of lung ([facility]  . Endometriosis    Hysterectomy with BSO at age [age]rs  . [place]   intramucosal  . Gastritis   . GERD (gastroesophageal reflux disease)   . H/O: pneumonia   . Heavy smoker (more than 20 cigarettes per day) 06/24/2015  . Hiatal hernia   . Hyperlipidemia   . Hypertension 06/24/2015   likely improved incidental to 40 lbs weight loss from her neoplasm. No Longer taking med for this as of 08-06-15  . IBS (irritable bowel syndrome)   . Pain    left hip-persistent"tumor of bone"-radiation tx. 10.  . Pulmonary nodules   . Swelling of ankle    bilateral  . Vitamin D deficiency disease    SURGICAL HISTORY: Past Surgical History:  Procedure Laterality Date  . ABDOMINAL HYSTERECTOMY    . BARTHOLIN GLAND CYST EXCISION  521o ago   Does not want if it was an infected cyst or tumor. Was soon as delivery  . COLONOSCOPY W/ POLYPECTOMY     multiple  times - last done 09/2014 per patient.  . ESOPHAGOGASTRODUODENOSCOPY (EGD) WITH PROPOFOL N/A 08/11/2015   Procedure: ESOPHAGOGASTRODUODENOSCOPY (EGD) WITH PROPOFOL;  Surgeon: Cindy Bears, Cindy Byrd;  Location: [facility];  Service: Gastroenterology;  Laterality: N/A;  . GANGLION CYST EXCISION    . KNEE ARTHROSCOPY  age about 17 yrs  . TONSILLECTOMY    . TOTAL ABDOMINAL HYSTERECTOMY W/ BILATERAL SALPINGOOPHORECTOMY  at age 80 yrs   For endometriosis    SOCIAL HISTORY: Social History    Social History  . Marital status: Widowed    Spouse name: N/A  . Number of children: 2  . Years of education: N/A   Occupational History  . Not on file.   Social History Main Topics  . Smoking status: Former Smoker    Packs/day: 1.00    Years: 60.00    Types: Cigarettes    Quit date: 12/06/2015  . Smokeless tobacco: Never Used  . Alcohol use No  . Drug use: No  . Sexual activity: No   Other Topics Concern  . Not on file   Social History Narrative  . No narrative on file    FAMILY HISTORY: Family History  Problem Relation Age of Onset  . Colon cancer Brother   . Colon cancer Brother   . Stroke Mother   . Colon cancer Father   . Breast cancer Daughter 38    ER/PR+ stage II    ALLERGIES:  is allergic to penicillins; remeron [mirtazapine]; and latex. patient wonders if she has a penicillin allergy but notes that she is uncertain about this.  MEDICATIONS:  Current Outpatient Prescriptions  Medication Sig Dispense Refill  . furosemide (LASIX) 20 MG tablet Take 20 mg by mouth 2 (two) times daily.    [name] amLODipine (NORVASC) 5 MG tablet TAKE 1 TABLET (5 MG TOTAL) BY MOUTH DAILY. 30 tablet 1  . artificial tears (LACRILUBE) OINT ophthalmic ointment Place into both eyes at bedtime. 1 Tube 1  . citalopram (CELEXA) 20 MG tablet Take 1 tablet (20 mg total) by mouth daily. 30 tablet 3  . clotrimazole-betamethasone (LOTRISONE) cream Apply 1 application topically 2 (two) times daily. Apply to perianal skin twice daily. 30 g 1  . diphenoxylate-atropine (LOMOTIL) 2.5-0.025 MG tablet Take 1-2 tablets by mouth 4 (four) times daily as needed for diarrhea or loose stools. 60 tablet 0  . docusate sodium (COLACE) 100 MG capsule Take 100 mg by mouth daily as needed for mild constipation. Reported on 02/23/2016    . Hypromellose (ARTIFICIAL TEARS) 0.4 % SOLN Apply 1 drop to eye QID. 1 Bottle 1  . lactobacillus acidophilus & bulgar (LACTINEX) chewable tablet Chew 1 tablet by mouth 3 (three)  times daily with meals. 60 tablet 1  . lidocaine-prilocaine (EMLA) cream Apply small amount over port 1-2 hours prior to treatment, cover with plastic wrap (DO NOT RUB IN). 30 g prn  . LORazepam (ATIVAN) 1 MG tablet Take 1 tablet (1 mg total) by mouth every 8 (eight) hours as needed for anxiety (or nausea). 60 tablet 0  . mesalamine (LIALDA) 1.2 g EC tablet Take 2 tablets (2.4 g total) by mouth daily with breakfast. 60 tablet 3  . NICOTINE STEP 1 21 MG/24HR patch PLACE 1 PATCH (21 MG TOTAL) ONTO THE SKIN DAILY. 28 patch 0  . omeprazole (PRILOSEC) 40 MG capsule TAKE ONE CAPSULE BY MOUTH EVERY DAY *NEEDS OFFICE VISIT FOR FURTHER REFILL* 90 capsule 0  . ondansetron (ZOFRAN) 8 MG tablet Take 1 tablet (8  mg total) by mouth every 8 (eight) hours as needed for nausea or vomiting. 30 tablet 2  . oxyCODONE-acetaminophen (PERCOCET/ROXICET) 5-325 MG tablet Take 1 tablet by mouth every 4 (four) hours as needed for severe pain. 60 tablet 0  . polycarbophil (FIBERCON) 625 MG tablet Take 2 tablets (1,250 mg total) by mouth 2 (two) times daily. 120 tablet 1  . potassium chloride SA (K-DUR,KLOR-CON) 20 MEQ tablet Take 1 tablet (20 mEq total) by mouth daily. While on lasix 10 tablet 0  . predniSONE (DELTASONE) 20 MG tablet Take 1 tablet (20 mg total) by mouth daily with breakfast. 20 tablet 0  . PRESCRIPTION MEDICATION Antibody Plan [facility]    . valsartan (DIOVAN) 80 MG tablet     . zolpidem (AMBIEN CR) 12.5 MG CR tablet Take 1 tablet (12.5 mg total) by mouth at bedtime as needed for sleep. 30 tablet 0   No current facility-administered medications for this visit.     REVIEW OF SYSTEMS:    10 point review of systems done and was noted to be negative except as noted above.  PHYSICAL EXAMINATION: ECOG PERFORMANCE STATUS: 2 - Symptomatic, <50% confined to bed  Vitals:   07/26/16 1331  BP: (!) 164/64  Pulse: 60  Resp: 17  Temp: 98.4 F (36.9 C)   Filed Weights   07/26/16 1331  Weight: 139 lb 9.6 oz (63.3  kg)  . Wt Readings from Last 3 Encounters:  07/26/16 139 lb 9.6 oz (63.3 kg)  07/18/16 142 lb 8 oz (64.6 kg)  07/15/16 169 lb 8 oz (76.9 kg)   GENERAL:alert, NAD, appears well. SKIN: skin color, texture, turgor are normal, no rashes or significant lesions EYES: normal, conjunctiva are pink and non-injected, sclera clear OROPHARYNX:no exudate, no erythema and lips, buccal mucosa, and tongue normal  NECK: supple, thyroid normal size, non-tender, without nodularity LYMPH:  no palpable lymphadenopathy in the cervical, axillary or inguinal LUNGS: clear to auscultation and bilateral reduced breath sounds with normal respiratory effort  HEART: regular rate & rhythm and no murmurs and resolved lower extremity edema ABDOMEN:abdomen soft, non-tender and normal bowel sounds Musculoskeletal: No pedal edema. No calf pain or tenderness.  PSYCH: alert & oriented x 3 with fluent speech NEURO: no focal motor/sensory deficits.  LABORATORY DATA:  I have reviewed the data as listed . CBC Latest Ref Rng & Units 07/26/2016 07/18/2016 07/15/2016  WBC 3.9 - 10.3 10e3/uL 6.6 4.7 4.9  Hemoglobin 11.6 - 15.9 g/dL 11.0(L) 10.4(L) 12.0  Hematocrit 34.8 - 46.6 % 34.7(L) 32.1(L) 37.1  Platelets 145 - 400 10e3/uL 216 125(L) 105(L)   . CMP Latest Ref Rng & Units 07/26/2016 07/18/2016 07/15/2016  Glucose 70 - 140 mg/dl 99 98 108  BUN 7.0 - 26.0 mg/dL 9.0 15.8 30.7(H)  Creatinine 0.6 - 1.1 mg/dL 0.8 0.8 1.4(H)  Sodium 136 - 145 mEq/L 141 142 138  Potassium 3.5 - 5.1 mEq/L 3.7 3.3(L) 2.3(LL)  Chloride 101 - 111 mmol/L - - -  CO2 22 - 29 mEq/L 29 23 22   Calcium 8.4 - 10.4 mg/dL 8.2(L) 7.9(L) 7.8(L)  Total Protein 6.4 - 8.3 g/dL 6.1(L) 6.0(L) 5.8(L)  Total Bilirubin 0.20 - 1.20 mg/dL 0.54 0.51 0.41  Alkaline Phos 40 - 150 U/L 82 90 90  AST 5 - 34 U/L 16 37(H) 43(H)  ALT 0 - 55 U/L 10 28 28      RADIOGRAPHIC STUDIES: I have personally reviewed the radiological images as listed and agreed with the findings in the  report. No results found.  ASSESSMENT & PLAN:   #1 Metastatic poorly differentiated carcinoma with likely lung primary [non-small cell lung cancer].  CT of the head with and without contrast showed no evidence of metastatic disease. Patient notes much improved pain control. EGFR blood test mutation analysis negative. Patient's pain is much better controlled after radiation for the painful ilium met. CT chest abdomen pelvis 04/19/2016 shows no evidence of disease progression. Patient tolerated Nivolumab very well until recently when she developed grade 2 Immune colitis as a result of which Nivolumab has been discontinued. CT chest abdomen pelvis on 06/24/2016 shows no evidence of new disease or progression of metastatic disease. Plan -Continue active surveillance with clinical follow-up and labs and repeat CT scan in one to 2 months. -continue every 4 weekly Xgeva for her bone metastases  #2 diarrhea- grade 2 likely related to immune colitis from her Nivolumab. This is significantly improvied with prednisone at this time. She notes it is back to her baseline of 2-4 semi-loose stools per day which is what it was pre-treatment. GI panel and c diff neg.  Patient had a colonoscopy with Cindy. Hilarie Byrd that showed mild scattered colitis and biopsy showed active colitis. #3 hypokalemia due to diarrhea -resolved. Plan -will continue  to taper off prednisone she will be off this week. -Patient was to continue Lialda as per Cindy Byrd and continue f/u with Cindy Byrd for management of inflammatory colitis with has discontinued her the Lialda by herself. I recommended she discuss this with Cindy. Hilarie Byrd. ---using loperamide prn which she feels is the most useful.  #4 increased left lower extremity swelling with some discomfort . Ultrasound bilateral lower extremities on 06/28/2016 was negative for DVT.  Swelling likely related to steroids. Plan  continue low-dose Lasix with potassium replacement. Recommended  use of compression socks -given prescription for Jobst stockings.  #5 Barrett's esophagus 4cms in the distal esophagus with low and high-grade dysplasia cannot rule out an early intra-mucosal esophageal adenocarcinoma. Plan -Patient being monitored by Cindy. Hilarie Byrd from GI. -Patient was [facility] at [facility] for further management and saw Cindy Byrd, Cindy Sabina, Cindy Byrd and Cindy Byrd.   Return to care with Cindy Byrd in 4 week with CBC, CMP  earlier if any new concerns .   [name] Orders Placed This Encounter  Procedures  . Compression stockings    Jobst stocking 20/39m of Hg    All questions were answered. The patient knows to call the clinic with any problems, questions or concerns.  GSullivan LoneMD MLong HillHematology/Oncology Physician CM[facility] (Office): [phone](Work cell): [phone](Fax): [phone]

## 2016-08-03 ENCOUNTER — Other Ambulatory Visit: Payer: Self-pay | Admitting: Hematology

## 2016-08-03 DIAGNOSIS — C3490 Malignant neoplasm of unspecified part of unspecified bronchus or lung: Secondary | ICD-10-CM

## 2016-08-04 ENCOUNTER — Telehealth: Payer: Self-pay | Admitting: Hematology

## 2016-08-04 ENCOUNTER — Ambulatory Visit (HOSPITAL_BASED_OUTPATIENT_CLINIC_OR_DEPARTMENT_OTHER): Payer: Medicare Other

## 2016-08-04 VITALS — HR 65 | Temp 98.5°F | Resp 18

## 2016-08-04 DIAGNOSIS — C3491 Malignant neoplasm of unspecified part of right bronchus or lung: Secondary | ICD-10-CM

## 2016-08-04 DIAGNOSIS — C7951 Secondary malignant neoplasm of bone: Secondary | ICD-10-CM

## 2016-08-04 MED ORDER — DENOSUMAB 120 MG/1.7ML ~~LOC~~ SOLN
120.0000 mg | Freq: Once | SUBCUTANEOUS | Status: AC
  Administered 2016-08-04: 120 mg via SUBCUTANEOUS
  Filled 2016-08-04: qty 1.7

## 2016-08-04 NOTE — Patient Instructions (Signed)
Denosumab injection  What is this medicine?  DENOSUMAB (den oh sue mab) slows bone breakdown. Prolia is used to treat osteoporosis in women after menopause and in men. Xgeva is used to prevent bone fractures and other bone problems caused by cancer bone metastases. Xgeva is also used to treat giant cell tumor of the bone.  This medicine may be used for other purposes; ask your health care provider or pharmacist if you have questions.  What should I tell my health care provider before I take this medicine?  They need to know if you have any of these conditions:  -dental disease  -eczema  -infection or history of infections  -kidney disease or on dialysis  -low blood calcium or vitamin D  -malabsorption syndrome  -scheduled to have surgery or tooth extraction  -taking medicine that contains denosumab  -thyroid or parathyroid disease  -an unusual reaction to denosumab, other medicines, foods, dyes, or preservatives  -pregnant or trying to get pregnant  -breast-feeding  How should I use this medicine?  This medicine is for injection under the skin. It is given by a health care professional in a hospital or clinic setting.  If you are getting Prolia, a special MedGuide will be given to you by the pharmacist with each prescription and refill. Be sure to read this information carefully each time.  For Prolia, talk to your pediatrician regarding the use of this medicine in children. Special care may be needed. For Xgeva, talk to your pediatrician regarding the use of this medicine in children. While this drug may be prescribed for children as young as 13 years for selected conditions, precautions do apply.  Overdosage: If you think you have taken too much of this medicine contact a poison control center or emergency room at once.  NOTE: This medicine is only for you. Do not share this medicine with others.  What if I miss a dose?  It is important not to miss your dose. Call your doctor or health care professional if you are  unable to keep an appointment.  What may interact with this medicine?  Do not take this medicine with any of the following medications:  -other medicines containing denosumab  This medicine may also interact with the following medications:  -medicines that suppress the immune system  -medicines that treat cancer  -steroid medicines like prednisone or cortisone  This list may not describe all possible interactions. Give your health care provider a list of all the medicines, herbs, non-prescription drugs, or dietary supplements you use. Also tell them if you smoke, drink alcohol, or use illegal drugs. Some items may interact with your medicine.  What should I watch for while using this medicine?  Visit your doctor or health care professional for regular checks on your progress. Your doctor or health care professional may order blood tests and other tests to see how you are doing.  Call your doctor or health care professional if you get a cold or other infection while receiving this medicine. Do not treat yourself. This medicine may decrease your body's ability to fight infection.  You should make sure you get enough calcium and vitamin D while you are taking this medicine, unless your doctor tells you not to. Discuss the foods you eat and the vitamins you take with your health care professional.  See your dentist regularly. Brush and floss your teeth as directed. Before you have any dental work done, tell your dentist you are receiving this medicine.  Do   not become pregnant while taking this medicine or for 5 months after stopping it. Women should inform their doctor if they wish to become pregnant or think they might be pregnant. There is a potential for serious side effects to an unborn child. Talk to your health care professional or pharmacist for more information.  What side effects may I notice from receiving this medicine?  Side effects that you should report to your doctor or health care professional as soon as  possible:  -allergic reactions like skin rash, itching or hives, swelling of the face, lips, or tongue  -breathing problems  -chest pain  -fast, irregular heartbeat  -feeling faint or lightheaded, falls  -fever, chills, or any other sign of infection  -muscle spasms, tightening, or twitches  -numbness or tingling  -skin blisters or bumps, or is dry, peels, or red  -slow healing or unexplained pain in the mouth or jaw  -unusual bleeding or bruising  Side effects that usually do not require medical attention (Report these to your doctor or health care professional if they continue or are bothersome.):  -muscle pain  -stomach upset, gas  This list may not describe all possible side effects. Call your doctor for medical advice about side effects. You may report side effects to FDA at 1-800-FDA-1088.  Where should I keep my medicine?  This medicine is only given in a clinic, doctor's office, or other health care setting and will not be stored at home.  NOTE: This sheet is a summary. It may not cover all possible information. If you have questions about this medicine, talk to your doctor, pharmacist, or health care provider.      2016, Elsevier/Gold Standard. (2012-05-21 12:37:47)

## 2016-08-04 NOTE — Telephone Encounter (Signed)
APPTS CONF WITH PT PER 07/26/16 LOS. WILL PICK UP APPT SCHD TODAY AT APPT. 08/04/16

## 2016-08-05 ENCOUNTER — Other Ambulatory Visit: Payer: Self-pay | Admitting: *Deleted

## 2016-08-05 DIAGNOSIS — C3490 Malignant neoplasm of unspecified part of unspecified bronchus or lung: Secondary | ICD-10-CM

## 2016-08-05 MED ORDER — ZOLPIDEM TARTRATE ER 12.5 MG PO TBCR: 12.5000 mg | EXTENDED_RELEASE_TABLET | Freq: Every evening | ORAL | 0 refills | Status: DC | PRN

## 2016-08-10 ENCOUNTER — Other Ambulatory Visit: Payer: Self-pay | Admitting: Hematology

## 2016-08-11 ENCOUNTER — Other Ambulatory Visit: Payer: Self-pay | Admitting: *Deleted

## 2016-08-11 DIAGNOSIS — C3490 Malignant neoplasm of unspecified part of unspecified bronchus or lung: Secondary | ICD-10-CM

## 2016-08-11 MED ORDER — PREDNISONE 20 MG PO TABS: 20.0000 mg | ORAL_TABLET | Freq: Every day | ORAL | 0 refills | Status: DC

## 2016-08-16 ENCOUNTER — Encounter: Payer: Self-pay | Admitting: *Deleted

## 2016-08-17 ENCOUNTER — Other Ambulatory Visit: Payer: Self-pay | Admitting: Hematology

## 2016-08-19 ENCOUNTER — Other Ambulatory Visit: Payer: Self-pay

## 2016-08-19 MED ORDER — LORAZEPAM 1 MG PO TABS: 1.0000 mg | ORAL_TABLET | Freq: Three times a day (TID) | ORAL | 0 refills | Status: DC | PRN

## 2016-08-19 NOTE — Telephone Encounter (Signed)
S/w pt. She stated she did need refill lorazepam. Called in rx.

## 2016-08-22 ENCOUNTER — Other Ambulatory Visit: Payer: Self-pay | Admitting: Hematology

## 2016-08-23 ENCOUNTER — Ambulatory Visit (HOSPITAL_BASED_OUTPATIENT_CLINIC_OR_DEPARTMENT_OTHER): Payer: Medicare Other

## 2016-08-23 ENCOUNTER — Other Ambulatory Visit: Payer: Self-pay | Admitting: *Deleted

## 2016-08-23 ENCOUNTER — Ambulatory Visit: Payer: Medicare Other

## 2016-08-23 ENCOUNTER — Other Ambulatory Visit (HOSPITAL_BASED_OUTPATIENT_CLINIC_OR_DEPARTMENT_OTHER): Payer: Medicare Other

## 2016-08-23 ENCOUNTER — Ambulatory Visit (HOSPITAL_BASED_OUTPATIENT_CLINIC_OR_DEPARTMENT_OTHER): Payer: Medicare Other | Admitting: Hematology

## 2016-08-23 VITALS — BP 146/64 | HR 69 | Temp 98.4°F | Resp 16 | Ht 67.0 in | Wt 134.6 lb

## 2016-08-23 DIAGNOSIS — C3490 Malignant neoplasm of unspecified part of unspecified bronchus or lung: Secondary | ICD-10-CM

## 2016-08-23 DIAGNOSIS — C3491 Malignant neoplasm of unspecified part of right bronchus or lung: Secondary | ICD-10-CM | POA: Diagnosis not present

## 2016-08-23 DIAGNOSIS — E860 Dehydration: Secondary | ICD-10-CM

## 2016-08-23 DIAGNOSIS — E876 Hypokalemia: Secondary | ICD-10-CM

## 2016-08-23 DIAGNOSIS — Z95828 Presence of other vascular implants and grafts: Secondary | ICD-10-CM

## 2016-08-23 DIAGNOSIS — M7989 Other specified soft tissue disorders: Secondary | ICD-10-CM

## 2016-08-23 DIAGNOSIS — C7951 Secondary malignant neoplasm of bone: Secondary | ICD-10-CM

## 2016-08-23 DIAGNOSIS — K2270 Barrett's esophagus without dysplasia: Secondary | ICD-10-CM

## 2016-08-23 LAB — CBC & DIFF AND RETIC
BASO%: 0.2 % (ref 0.0–2.0)
BASOS ABS: 0 10*3/uL (ref 0.0–0.1)
EOS%: 0.5 % (ref 0.0–7.0)
Eosinophils Absolute: 0 10*3/uL (ref 0.0–0.5)
HEMATOCRIT: 38.3 % (ref 34.8–46.6)
HGB: 12.5 g/dL (ref 11.6–15.9)
Immature Retic Fract: 3.7 % (ref 1.60–10.00)
LYMPH#: 0.7 10*3/uL — AB (ref 0.9–3.3)
LYMPH%: 8.3 % — ABNORMAL LOW (ref 14.0–49.7)
MCH: 28.4 pg (ref 25.1–34.0)
MCHC: 32.6 g/dL (ref 31.5–36.0)
MCV: 87 fL (ref 79.5–101.0)
MONO#: 0.4 10*3/uL (ref 0.1–0.9)
MONO%: 4.9 % (ref 0.0–14.0)
NEUT#: 7.4 10*3/uL — ABNORMAL HIGH (ref 1.5–6.5)
NEUT%: 86.1 % — ABNORMAL HIGH (ref 38.4–76.8)
PLATELETS: 204 10*3/uL (ref 145–400)
RBC: 4.4 10*6/uL (ref 3.70–5.45)
RDW: 15.1 % — AB (ref 11.2–14.5)
RETIC %: 1.47 % (ref 0.70–2.10)
RETIC CT ABS: 64.68 10*3/uL (ref 33.70–90.70)
WBC: 8.6 10*3/uL (ref 3.9–10.3)

## 2016-08-23 LAB — COMPREHENSIVE METABOLIC PANEL
ALK PHOS: 71 U/L (ref 40–150)
ALT: 10 U/L (ref 0–55)
AST: 13 U/L (ref 5–34)
Albumin: 3.1 g/dL — ABNORMAL LOW (ref 3.5–5.0)
Anion Gap: 10 mEq/L (ref 3–11)
BUN: 14.8 mg/dL (ref 7.0–26.0)
CALCIUM: 8.6 mg/dL (ref 8.4–10.4)
CHLORIDE: 106 meq/L (ref 98–109)
CO2: 26 meq/L (ref 22–29)
Creatinine: 0.9 mg/dL (ref 0.6–1.1)
EGFR: 64 mL/min/{1.73_m2} — ABNORMAL LOW (ref >90)
Glucose: 98 mg/dl (ref 70–140)
POTASSIUM: 2.5 meq/L — AB (ref 3.5–5.1)
Sodium: 142 mEq/L (ref 136–145)
Total Bilirubin: 0.5 mg/dL (ref 0.20–1.20)
Total Protein: 7 g/dL (ref 6.4–8.3)

## 2016-08-23 MED ORDER — DIPHENOXYLATE-ATROPINE 2.5-0.025 MG PO TABS: 1.0000 | ORAL_TABLET | Freq: Four times a day (QID) | ORAL | 0 refills | Status: DC | PRN

## 2016-08-23 MED ORDER — POTASSIUM CHLORIDE CRYS ER 20 MEQ PO TBCR: EXTENDED_RELEASE_TABLET | ORAL | 0 refills | Status: DC

## 2016-08-23 MED ORDER — SODIUM CHLORIDE 0.45 % IV SOLN: INTRAVENOUS | Status: DC

## 2016-08-23 MED ORDER — SODIUM CHLORIDE 0.9 % IJ SOLN
10.0000 mL | INTRAMUSCULAR | Status: AC | PRN
  Administered 2016-08-23: 10 mL
  Filled 2016-08-23: qty 10

## 2016-08-23 MED ORDER — POTASSIUM CHLORIDE CRYS ER 20 MEQ PO TBCR: 20.0000 meq | EXTENDED_RELEASE_TABLET | Freq: Every day | ORAL | 0 refills | Status: DC

## 2016-08-23 MED ORDER — HEPARIN SOD (PORK) LOCK FLUSH 100 UNIT/ML IV SOLN
500.0000 [IU] | INTRAVENOUS | Status: AC | PRN
  Administered 2016-08-23: 500 [IU]
  Filled 2016-08-23: qty 5

## 2016-08-23 MED ORDER — SODIUM CHLORIDE 0.9 % IV SOLN
Freq: Once | INTRAVENOUS | Status: AC
  Administered 2016-08-23: 15:00:00 via INTRAVENOUS
  Filled 2016-08-23: qty 1000

## 2016-08-23 MED ORDER — POTASSIUM CHLORIDE 20 MEQ/15ML (10%) PO SOLN
40.0000 meq | Freq: Once | ORAL | Status: DC
  Filled 2016-08-23: qty 30

## 2016-08-23 NOTE — Patient Instructions (Addendum)
Dehydration, Adult Dehydration is a condition in which you do not have enough fluid or water in your body. It happens when you take in less fluid than you lose. Vital organs such as the kidneys, brain, and heart cannot function without a proper amount of fluids. Any loss of fluids from the body can cause dehydration.  Dehydration can range from mild to severe. This condition should be treated right away to help prevent it from becoming severe. CAUSES  This condition may be caused by:  Vomiting.  Diarrhea.  Excessive sweating, such as when exercising in hot or humid weather.  Not drinking enough fluid during strenuous exercise or during an illness.  Excessive urine output.  Fever.  Certain medicines. RISK FACTORS This condition is more likely to develop in:  People who are taking certain medicines that cause the body to lose excess fluid (diuretics).   People who have a chronic illness, such as diabetes, that may increase urination.  Older adults.   People who live at high altitudes.   People who participate in endurance sports.  SYMPTOMS  Mild Dehydration  Thirst.  Dry lips.  Slightly dry mouth.  Dry, warm skin. Moderate Dehydration  Very dry mouth.   Muscle cramps.   Dark urine and decreased urine production.   Decreased tear production.   Headache.   Light-headedness, especially when you stand up from a sitting position.  Severe Dehydration  Changes in skin.   Cold and clammy skin.   Skin does not spring back quickly when lightly pinched and released.   Changes in body fluids.   Extreme thirst.   No tears.   Not able to sweat when body temperature is high, such as in hot weather.   Minimal urine production.   Changes in vital signs.   Rapid, weak pulse (more than 100 beats per minute when you are sitting still).   Rapid breathing.   Low blood pressure.   Other changes.   Sunken eyes.   Cold hands and feet.    Confusion.  Lethargy and difficulty being awakened.  Fainting (syncope).   Short-term weight loss.   Unconsciousness. DIAGNOSIS  This condition may be diagnosed based on your symptoms. You may also have tests to determine how severe your dehydration is. These tests may include:   Urine tests.   Blood tests.  TREATMENT  Treatment for this condition depends on the severity. Mild or moderate dehydration can often be treated at home. Treatment should be started right away. Do not wait until dehydration becomes severe. Severe dehydration needs to be treated at the hospital. Treatment for Mild Dehydration  Drinking plenty of water to replace the fluid you have lost.   Replacing minerals in your blood (electrolytes) that you may have lost.  Treatment for Moderate Dehydration  Consuming oral rehydration solution (ORS). Treatment for Severe Dehydration  Receiving fluid through an IV tube.   Receiving electrolyte solution through a feeding tube that is passed through your nose and into your stomach (nasogastric tube or NG tube).  Correcting any abnormalities in electrolytes. HOME CARE INSTRUCTIONS   Drink enough fluid to keep your urine clear or pale yellow.   Drink water or fluid slowly by taking small sips. You can also try sucking on ice cubes.  Have food or beverages that contain electrolytes. Examples include bananas and sports drinks.  Take over-the-counter and prescription medicines only as told by your health care provider.   Prepare ORS according to the manufacturer's instructions. Take sips   of ORS every 5 minutes until your urine returns to normal.  If you have vomiting or diarrhea, continue to try to drink water, ORS, or both.   If you have diarrhea, avoid:   Beverages that contain caffeine.   Fruit juice.   Milk.   Carbonated soft drinks.  Do not take salt tablets. This can lead to the condition of having too much sodium in your body  (hypernatremia).  SEEK MEDICAL CARE IF:  You cannot eat or drink without vomiting.  You have had moderate diarrhea during a period of more than 24 hours.  You have a fever. SEEK IMMEDIATE MEDICAL CARE IF:   You have extreme thirst.  You have severe diarrhea.  You have not urinated in 6-8 hours, or you have urinated only a small amount of very dark urine.  You have shriveled skin.  You are dizzy, confused, or both.   This information is not intended to replace advice given to you by your health care provider. Make sure you discuss any questions you have with your health care provider.   Document Released: 11/21/2005 Document Revised: 08/12/2015 Document Reviewed: 04/08/2015 Elsevier Interactive Patient Education 2016 [place]. Potassium Content of Foods Potassium is a mineral found in many foods and drinks. It helps keep fluids and minerals balanced in your body and affects how steadily your heart beats. Potassium also helps control your blood pressure and keep your muscles and nervous system healthy. Certain health conditions and medicines may change the balance of potassium in your body. When this happens, you can help balance your level of potassium through the foods that you do or do not eat. Your health care provider or dietitian may recommend an amount of potassium that you should have each day. The following lists of foods provide the amount of potassium (in parentheses) per serving in each item. HIGH IN POTASSIUM  The following foods and beverages have 200 mg or more of potassium per serving:  Apricots, 2 raw or 5 dry (200 mg).  Artichoke, 1 medium (345 mg).  Avocado, raw,  each (245 mg).  Banana, 1 medium (425 mg).  Beans, lima, or baked beans, canned,  cup (280 mg).  Beans, white, canned,  cup (595 mg).  Beef roast, 3 oz (320 mg).  Beef, ground, 3 oz (270 mg).  Beets, raw or cooked,  cup (260 mg).  Bran muffin, 2 oz (300 mg).  Broccoli,  cup (230  mg).  Brussels sprouts,  cup (250 mg).  Cantaloupe,  cup (215 mg).  Cereal, 100% bran,  cup (200-400 mg).  Cheeseburger, single, fast food, 1 each (225-400 mg).  Chicken, 3 oz (220 mg).  Clams, canned, 3 oz (535 mg).  Crab, 3 oz (225 mg).  Dates, 5 each (270 mg).  Dried beans and peas,  cup (300-475 mg).  Figs, dried, 2 each (260 mg).  Fish: halibut, tuna, cod, snapper, 3 oz (480 mg).  Fish: salmon, haddock, swordfish, perch, 3 oz (300 mg).  Fish, tuna, canned 3 oz (200 mg).  [place] fries, fast food, 3 oz (470 mg).  Granola with fruit and nuts,  cup (200 mg).  Grapefruit juice,  cup (200 mg).  Greens, beet,  cup (655 mg).  Honeydew melon,  cup (200 mg).  Kale, raw, 1 cup (300 mg).  Kiwi, 1 medium (240 mg).  Kohlrabi, rutabaga, parsnips,  cup (280 mg).  Lentils,  cup (365 mg).  Mango, 1 each (325 mg).  Milk, chocolate, 1 cup (420 mg).  Milk: nonfat, low-fat, whole, buttermilk, 1 cup (350-380 mg).  Molasses, 1 Tbsp (295 mg).  Mushrooms,  cup (280) mg.  Nectarine, 1 each (275 mg).  Nuts: almonds, peanuts, hazelnuts, [place], cashew, mixed, 1 oz (200 mg).  Nuts, pistachios, 1 oz (295 mg).  Orange, 1 each (240 mg).  Orange juice,  cup (235 mg).  Papaya, medium,  fruit (390 mg).  Peanut butter, chunky, 2 Tbsp (240 mg).  Peanut butter, smooth, 2 Tbsp (210 mg).  Pear, 1 medium (200 mg).  Pomegranate, 1 whole (400 mg).  Pomegranate juice,  cup (215 mg).  Pork, 3 oz (350 mg).  Potato chips, salted, 1 oz (465 mg).  Potato, baked with skin, 1 medium (925 mg).  Potatoes, boiled,  cup (255 mg).  Potatoes, mashed,  cup (330 mg).  Prune juice,  cup (370 mg).  Prunes, 5 each (305 mg).  Pudding, chocolate,  cup (230 mg).  Pumpkin, canned,  cup (250 mg).  Raisins, seedless,  cup (270 mg).  Seeds, sunflower or pumpkin, 1 oz (240 mg).  Soy milk, 1 cup (300 mg).  Spinach,  cup (420 mg).  Spinach, canned,  cup (370  mg).  Sweet potato, baked with skin, 1 medium (450 mg).  Swiss chard,  cup (480 mg).  Tomato or vegetable juice,  cup (275 mg).  Tomato sauce or puree,  cup (400-550 mg).  Tomato, raw, 1 medium (290 mg).  Tomatoes, canned,  cup (200-300 mg).  [place], 3 oz (250 mg).  Wheat germ, 1 oz (250 mg).  Winter squash,  cup (250 mg).  Yogurt, plain or fruited, 6 oz (260-435 mg).  Zucchini,  cup (220 mg). MODERATE IN POTASSIUM The following foods and beverages have 50-200 mg of potassium per serving:  Apple, 1 each (150 mg).  Apple juice,  cup (150 mg).  Applesauce,  cup (90 mg).  Apricot nectar,  cup (140 mg).  Asparagus, small spears,  cup or 6 spears (155 mg).  Bagel, cinnamon raisin, 1 each (130 mg).  Bagel, egg or plain, 4 in., 1 each (70 mg).  Beans, green,  cup (90 mg).  Beans, yellow,  cup (190 mg).  Beer, regular, 12 oz (100 mg).  Beets, canned,  cup (125 mg).  Blackberries,  cup (115 mg).  Blueberries,  cup (60 mg).  Bread, whole wheat, 1 slice (70 mg).  Broccoli, raw,  cup (145 mg).  Cabbage,  cup (150 mg).  Carrots, cooked or raw,  cup (180 mg).  Cauliflower, raw,  cup (150 mg).  Celery, raw,  cup (155 mg).  Cereal, bran flakes, cup (120-150 mg).  Cheese, cottage,  cup (110 mg).  Cherries, 10 each (150 mg).  Chocolate, 1 oz bar (165 mg).  Coffee, brewed 6 oz (90 mg).  Corn,  cup or 1 ear (195 mg).  Cucumbers,  cup (80 mg).  Egg, large, 1 each (60 mg).  Eggplant,  cup (60 mg).  Endive, raw, cup (80 mg).  English muffin, 1 each (65 mg).  Fish, orange roughy, 3 oz (150 mg).  Frankfurter, beef or pork, 1 each (75 mg).  Fruit cocktail,  cup (115 mg).  Grape juice,  cup (170 mg).  Grapefruit,  fruit (175 mg).  Grapes,  cup (155 mg).  Greens: kale, turnip, collard,  cup (110-150 mg).  Ice cream or frozen yogurt, chocolate,  cup (175 mg).  Ice cream or frozen yogurt, vanilla,  cup (120-150  mg).  Lemons, limes, 1 each (80 mg).  Lettuce,  all types, 1 cup (100 mg).  Mixed vegetables,  cup (150 mg).  Mushrooms, raw,  cup (110 mg).  Nuts: walnuts, pecans, or macadamia, 1 oz (125 mg).  Oatmeal,  cup (80 mg).  Okra,  cup (110 mg).  Onions, raw,  cup (120 mg).  Peach, 1 each (185 mg).  Peaches, canned,  cup (120 mg).  Pears, canned,  cup (120 mg).  Peas, green, frozen,  cup (90 mg).  Peppers, green,  cup (130 mg).  Peppers, red,  cup (160 mg).  Pineapple juice,  cup (165 mg).  Pineapple, fresh or canned,  cup (100 mg).  Plums, 1 each (105 mg).  Pudding, vanilla,  cup (150 mg).  Raspberries,  cup (90 mg).  Rhubarb,  cup (115 mg).  Rice, wild,  cup (80 mg).  Shrimp, 3 oz (155 mg).  Spinach, raw, 1 cup (170 mg).  Strawberries,  cup (125 mg).  Summer squash  cup (175-200 mg).  Swiss chard, raw, 1 cup (135 mg).  Tangerines, 1 each (140 mg).  Tea, brewed, 6 oz (65 mg).  Turnips,  cup (140 mg).  Watermelon,  cup (85 mg).  Wine, red, table, 5 oz (180 mg).  Wine, white, table, 5 oz (100 mg). LOW IN POTASSIUM The following foods and beverages have less than 50 mg of potassium per serving.  Bread, white, 1 slice (30 mg).  Carbonated beverages, 12 oz (less than 5 mg).  Cheese, 1 oz (20-30 mg).  Cranberries,  cup (45 mg).  Cranberry juice cocktail,  cup (20 mg).  Fats and oils, 1 Tbsp (less than 5 mg).  Hummus, 1 Tbsp (32 mg).  Nectar: papaya, mango, or pear,  cup (35 mg).  Rice, white or brown,  cup (50 mg).  Spaghetti or macaroni,  cup cooked (30 mg).  Tortilla, flour or corn, 1 each (50 mg).  Waffle, 4 in., 1 each (50 mg).  Water chestnuts,  cup (40 mg).   This information is not intended to replace advice given to you by your health care provider. Make sure you discuss any questions you have with your health care provider.   Document Released: 07/05/2005 Document Revised: 11/26/2013 Document  Reviewed: 10/18/2013 Elsevier Interactive Patient Education 2016 [employer]. Hypokalemia Hypokalemia means that the amount of potassium in the blood is lower than normal.Potassium is a chemical, called an electrolyte, that helps regulate the amount of fluid in the body. It also stimulates muscle contraction and helps nerves function properly.Most of the body's potassium is inside of cells, and only a very small amount is in the blood. Because the amount in the blood is so small, minor changes can be life-threatening. CAUSES  Antibiotics.  Diarrhea or vomiting.  Using laxatives too much, which can cause diarrhea.  Chronic kidney disease.  Water pills (diuretics).  Eating disorders (bulimia).  Low magnesium level.  Sweating a lot. SIGNS AND SYMPTOMS  Weakness.  Constipation.  Fatigue.  Muscle cramps.  Mental confusion.  Skipped heartbeats or irregular heartbeat (palpitations).  Tingling or numbness. DIAGNOSIS  Your health care provider can diagnose hypokalemia with blood tests. In addition to checking your potassium level, your health care provider may also check other lab tests. TREATMENT Hypokalemia can be treated with potassium supplements taken by mouth or adjustments in your current medicines. If your potassium level is very low, you may need to get potassium through a vein (IV) and be monitored in the hospital. A diet high in potassium is also helpful. Foods high in  potassium are:  Nuts, such as peanuts and pistachios.  Seeds, such as sunflower seeds and pumpkin seeds.  Peas, lentils, and lima beans.  Whole grain and bran cereals and breads.  Fresh fruit and vegetables, such as apricots, avocado, bananas, cantaloupe, kiwi, oranges, tomatoes, asparagus, and potatoes.  Orange and tomato juices.  Red meats.  Fruit yogurt. HOME CARE INSTRUCTIONS  Take all medicines as prescribed by your health care provider.  Maintain a healthy diet by including  nutritious food, such as fruits, vegetables, nuts, whole grains, and lean meats.  If you are taking a laxative, be sure to follow the directions on the label. SEEK MEDICAL CARE IF:  Your weakness gets worse.  You feel your heart pounding or racing.  You are vomiting or having diarrhea.  You are diabetic and having trouble keeping your blood glucose in the normal range. SEEK IMMEDIATE MEDICAL CARE IF:  You have chest pain, shortness of breath, or dizziness.  You are vomiting or having diarrhea for more than 2 days.  You faint. MAKE SURE YOU:   Understand these instructions.  Will watch your condition.  Will get help right away if you are not doing well or get worse.   This information is not intended to replace advice given to you by your health care provider. Make sure you discuss any questions you have with your health care provider.   Document Released: 11/21/2005 Document Revised: 12/12/2014 Document Reviewed: 05/24/2013 Elsevier Interactive Patient Education [employer].

## 2016-08-23 NOTE — Progress Notes (Signed)
Cindy Byrd    HEMATOLOGY ONCOLOGY PROGRESS NOTE  Date of service: .08/23/2016  Patient Care Team: Tamsen Roers, MD as PCP - General (Family Medicine) [name], MD as Consulting Physician (Hematology and Oncology)  CHIEF COMPLAINTS/PURPOSE OF CONSULTATION: Follow-up for metastatic lung cancer  DIAGNOSIS:   #1 Metastatic non-small cell lung cancer with bilateral lung nodules and large metastatic lesion in the left Ilium. #2 Barrett's esophagus with some evidence of intramucosal adenocarcinoma of the esophagus. 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  Previous Treatment  1 Palliative radiation therapy to the large left ilium metastases 2. IV Nivolumab x 20ycles (discontinued due to likely immune colitis) 3. Xgeva 1264mSC q4weeks for bone metastases.   HISTORY OF PRESENTING ILLNESS: (plz see my previous consultation for details of initial presentation)  INTERVAL HISTORY  Cindy Byrd here for her scheduled followup. She notes she continues to have some mild chronic diarrhea but no significant abdominal pain or cramping. Her breathing is stable. No chest pain nausea or shortness of breath. Energy levels are better than on last visit. Continues to be on Xgeva for her bone metastases. Has been physically active. Notes have bowel urgency. Was not taking the mesalamine as recommended by Dr.Pyrtle but was recommended to restart this and notes that she well. Has been using Imodium when necessary to control her bowel movements. No fevers or chills no night sweats.   MEDICAL HISTORY:  Past Medical History:  Diagnosis Date  . Barrett's esophagus   . Bone neoplasm 06/24/2015  . [facility]   metastatic poorly differentiated carcinoma. tumor left groin surgical removal with radiation tx.  . Cataract      BILATERAL  . Cigarette smoker two packs a day or less    Currently still smoking 2 PPD - Not interested in quitting at this time.  . Colitis 2017  . Colon polyps    hyperplastic, tubular adenomas, tubulovillous adenoma  . Cough, persistent    hx. lung cancer ? primary-being evaluated, unsure of primary site.  . Depression 06/24/2015  . Diverticulosis   . Emphysema of lung ([facility]  . Endometriosis    Hysterectomy with BSO at age 89rs  . [place]   intramucosal  . Gastritis   . GERD (gastroesophageal reflux disease)   . H/O: pneumonia   . Heavy smoker (more than 20 cigarettes per day) 06/24/2015  . Hiatal hernia   . Hyperlipidemia   . Hypertension 06/24/2015   likely improved incidental to 40 lbs weight loss from her neoplasm. No Longer taking med for this as of 08-06-15  . IBS (irritable bowel syndrome)   . Pain    left hip-persistent"tumor of bone"-radiation tx. 10.  . Pulmonary nodules   . Swelling of ankle    bilateral  . Vitamin D deficiency disease    SURGICAL HISTORY: Past Surgical History:  Procedure Laterality Date  . ABDOMINAL HYSTERECTOMY    . BARTHOLIN GLAND CYST EXCISION  5240o ago   Does not want if it was an infected cyst or tumor. Was soon as delivery  . COLONOSCOPY W/ POLYPECTOMY     multiple times - last done 09/2014 per patient.  . ESOPHAGOGASTRODUODENOSCOPY (EGD) WITH PROPOFOL N/A 08/11/2015   Procedure: ESOPHAGOGASTRODUODENOSCOPY (EGD) WITH PROPOFOL;  Surgeon: JaJerene BearsMD;  Location: [facility];  Service: Gastroenterology;  Laterality: N/A;  . GANGLION CYST EXCISION    . KNEE ARTHROSCOPY  age about 3 yrs  . TONSILLECTOMY    . TOTAL ABDOMINAL HYSTERECTOMY W/ BILATERAL SALPINGOOPHORECTOMY  at age 4 yrs   For endometriosis    SOCIAL HISTORY: Social History   Social History  . Marital status: Widowed    Spouse name: N/A  . Number of children: 2  . Years of education: N/A   Occupational History  . Not on file.    Social History Main Topics  . Smoking status: Former Smoker    Packs/day: 1.00    Years: 60.00    Types: Cigarettes    Quit date: 12/06/2015  . Smokeless tobacco: Never Used  . Alcohol use No  . Drug use: No  . Sexual activity: No   Other Topics Concern  . Not on file   Social History Narrative  . No narrative on file    FAMILY HISTORY: Family History  Problem Relation Age of Onset  . Colon cancer Brother   . Colon cancer Brother   . Stroke Mother   . Colon cancer Father   . Breast cancer Daughter 34    ER/PR+ stage II    ALLERGIES:  is allergic to penicillins; remeron [mirtazapine]; and latex. patient wonders if she has a penicillin allergy but notes that she is uncertain about this.  MEDICATIONS:  Current Facility-Administered Medications  Medication Dose Route Frequency Provider Last Rate Last Dose  . potassium chloride 20 MEQ/15ML (10%) solution 40 mEq  40 mEq Oral Once [name], MD       No current outpatient prescriptions on file.   Facility-Administered Medications Ordered in Other Visits  Medication Dose Route Frequency Provider Last Rate Last Dose  . 0.9 %  sodium chloride infusion   Intravenous Continuous Rise Patience, MD 125 mL/hr at 09/02/16 0434    . acetaminophen (TYLENOL) tablet 650 mg  650 mg Oral Q6H PRN Rise Patience, MD       Or  . acetaminophen (TYLENOL) suppository 650 mg  650 mg Rectal Q6H PRN Rise Patience, MD      . amLODipine (NORVASC) tablet 5 mg  5 mg Oral Daily Rise Patience, MD      . aztreonam (AZACTAM) 1 g in dextrose 5 % 50 mL IVPB  1 g Intravenous Q8H Rise Patience, MD   1 g at 09/02/16 0501  . citalopram (CELEXA) tablet 20 mg  20 mg Oral Daily Rise Patience, MD      . diphenoxylate-atropine (LOMOTIL) 2.5-0.025 MG per tablet 1-2 tablet  1-2 tablet Oral QID PRN Rise Patience, MD   2 tablet at 09/02/16 0853  . enoxaparin (LOVENOX) injection 40 mg  40 mg Subcutaneous QHS Rise Patience, MD   40 mg at 09/01/16 2314  . hydrALAZINE (APRESOLINE) injection 10 mg  10 mg Intravenous Q4H PRN Rise Patience, MD      . lactobacillus acidophilus & bulgar (LACTINEX) chewable tablet 1 tablet  1 tablet Oral TID WC Rise Patience, MD      . LORazepam (ATIVAN) tablet 1 mg  1 mg Oral Q8H PRN Rise Patience, MD      . mesalamine (LIALDA) EC tablet 2.4 g  2.4 g Oral Q breakfast Rise Patience, MD      . ondansetron [facility]) tablet 4 mg  4 mg Oral Q6H PRN Rise Patience, MD  Or  . ondansetron (ZOFRAN) injection 4 mg  4 mg Intravenous Q6H PRN Rise Patience, MD      . oxyCODONE-acetaminophen (PERCOCET/ROXICET) 5-325 MG per tablet 1 tablet  1 tablet Oral Q4H PRN Rise Patience, MD   1 tablet at 09/02/16 [phone]  . pantoprazole (PROTONIX) EC tablet 40 mg  40 mg Oral Daily Rise Patience, MD      . potassium chloride SA (K-DUR,KLOR-CON) CR tablet 10 mEq  10 mEq Oral Daily Rise Patience, MD      . sodium chloride flush (NS) 0.9 % injection 10-40 mL  10-40 mL Intracatheter PRN Rise Patience, MD      . vancomycin (VANCOCIN) 500 mg in sodium chloride 0.9 % 100 mL IVPB  500 mg Intravenous Q12H Emiliano Dyer, RPH   500 mg at 09/01/16 2257  . zolpidem (AMBIEN) tablet 5 mg  5 mg Oral QHS PRN Rise Patience, MD   5 mg at 09/01/16 2314    REVIEW OF SYSTEMS:    10 point review of systems done and was noted to be negative except as noted above.  PHYSICAL EXAMINATION: ECOG PERFORMANCE STATUS: 2 - Symptomatic, <50% confined to bed  Vitals:   08/23/16 1336  BP: (!) 146/64  Pulse: 69  Resp: 16  Temp: 98.4 F (36.9 C)   Filed Weights   08/23/16 1336  Weight: 134 lb 9.6 oz (61.1 kg)  . Wt Readings from Last 3 Encounters:  09/02/16 133 lb 2.5 oz (60.4 kg)  08/23/16 134 lb 9.6 oz (61.1 kg)  07/26/16 139 lb 9.6 oz (63.3 kg)   GENERAL:alert, NAD, appears well. SKIN: skin color, texture, turgor are normal, no rashes or significant  lesions EYES: normal, conjunctiva are pink and non-injected, sclera clear OROPHARYNX:no exudate, no erythema and lips, buccal mucosa, and tongue normal  NECK: supple, thyroid normal size, non-tender, without nodularity LYMPH:  no palpable lymphadenopathy in the cervical, axillary or inguinal LUNGS: clear to auscultation and bilateral reduced breath sounds with normal respiratory effort  HEART: regular rate & rhythm and no murmurs and resolved lower extremity edema ABDOMEN:abdomen soft, non-tender and normal bowel sounds Musculoskeletal: No pedal edema. No calf pain or tenderness.  PSYCH: alert & oriented x 3 with fluent speech NEURO: no focal motor/sensory deficits.  LABORATORY DATA:  I have reviewed the data as listed  Component     Latest Ref Rng & Units 08/23/2016  WBC     3.9 - 10.3 10e3/uL 8.6  NEUT#     1.5 - 6.5 10e3/uL 7.4 (H)  Hemoglobin     11.6 - 15.9 g/dL 12.5  HCT     34.8 - 46.6 % 38.3  Platelets     145 - 400 10e3/uL 204  MCV     79.5 - 101.0 fL 87.0  MCH     25.1 - 34.0 pg 28.4  MCHC     31.5 - 36.0 g/dL 32.6  RBC     3.70 - 5.45 10e6/uL 4.40  RDW     11.2 - 14.5 % 15.1 (H)  lymph#     0.9 - 3.3 10e3/uL 0.7 (L)  MONO#     0.1 - 0.9 10e3/uL 0.4  Eosinophils Absolute     0.0 - 0.5 10e3/uL 0.0  Basophils Absolute     0.0 - 0.1 10e3/uL 0.0  NEUT%     38.4 - 76.8 % 86.1 (H)  LYMPH%     14.0 - 49.7 %  8.3 (L)  MONO%     0.0 - 14.0 % 4.9  EOS%     0.0 - 7.0 % 0.5  BASO%     0.0 - 2.0 % 0.2  Retic %     0.70 - 2.10 % 1.47  Retic Ct Abs     33.70 - 90.70 10e3/uL 64.68  Immature Retic Fract     1.60 - 10.00 % 3.70  Sodium     136 - 145 mEq/L 142  Potassium     3.5 - 5.1 mEq/L 2.5 (LL)  Chloride     98 - 109 mEq/L 106  CO2     22 - 29 mEq/L 26  Glucose     70 - 140 mg/dl 98  BUN     7.0 - 26.0 mg/dL 14.8  Creatinine     0.6 - 1.1 mg/dL 0.9  Total Bilirubin     0.20 - 1.20 mg/dL 0.50  Alkaline Phosphatase     40 - 150 U/L 71  AST     5  - 34 U/L 13  ALT     0 - 55 U/L 10  Total Protein     6.4 - 8.3 g/dL 7.0  Albumin     3.5 - 5.0 g/dL 3.1 (L)  Calcium     8.4 - 10.4 mg/dL 8.6  Anion gap     3 - 11 mEq/L 10  EGFR     >90 ml/min/1.73 m2 64 (L)     RADIOGRAPHIC STUDIES: I have personally reviewed the radiological images as listed and agreed with the findings in the report. Dg Chest 2 View  Result Date: 09/01/2016 CLINICAL DATA:  Left lower chest abdominal pain beginning today. Dyspnea. Metastatic lung carcinoma. EXAM: CHEST  2 VIEW COMPARISON:  10/03/2014 FINDINGS: The heart size and mediastinal contours remain within normal limits. Aortic atherosclerosis. Right-sided power port remains in appropriate position. New airspace opacity is seen in the left lower lobe, suspicious for pneumonia. There is new linear opacity in the right lower lung, suspicious for atelectasis or scarring. No evidence of pneumothorax or pleural effusion. IMPRESSION: New airspace opacity in left lower lobe, suspicious for pneumonia. New right lower lung atelectasis versus scarring. Electronically Signed   By: Earle Gell M.D.   On: 09/01/2016 17:53    ASSESSMENT & PLAN:   #1 Metastatic poorly differentiated carcinoma with likely lung primary [non-small cell lung cancer].  CT of the head with and without contrast showed no evidence of metastatic disease. Patient notes much improved pain control. EGFR blood test mutation analysis negative. Patient's pain is much better controlled after radiation for the painful ilium met. CT chest abdomen pelvis 04/19/2016 shows no evidence of disease progression. Patient tolerated Nivolumab very well until recently when she developed grade 2 Immune colitis as a result of which Nivolumab has been discontinued. CT chest abdomen pelvis on 06/24/2016 shows no evidence of new disease or progression of metastatic disease. Plan -Continue active surveillance with clinical follow-up and labs and repeat CT of chest abdomen  pelvis prior to her next follow-up in 5-6 weeks. Annalee Genta for her bone metastases  #2 diarrhea- grade 1-2 likely related to immune colitis from her Nivolumab. This is significantly improved with prednisone at this time. She notes it is back to her baseline of 2-4 semi-loose stools per day which is what it was pre-treatment. GI panel and c diff neg.  Patient had a colonoscopy with Dr. [name] that showed mild scattered colitis  and biopsy showed active colitis. #3 hypokalemia due to diarrhea - K his by Dr. 2.5 today and patient appears somewhat dehydrated . Plan --She is now on steroids . --Was given IV fluids with potassium and magnesium today . -Given oral potassium replacement . --Recommended maintaining compliance with Lialda as per Dr [name] and continue f/u with Dr [name] for management of inflammatory colitis. Might need to increase the dose of Lialda or consider Infliximab Colitis worsens or is persistent . ---using loperamide prn which she feels is the most useful.  #4 increased left lower extremity swelling with some discomfort . Ultrasound bilateral lower extremities on 06/28/2016 was negative for DVT.  Swelling likely related to steroids. Plan  -improved. Hold lasix Recommended use of compression socks -given prescription for Jobst stockings.  #5 Barrett's esophagus 4cms in the distal esophagus with low and high-grade dysplasia cannot rule out an early intra-mucosal esophageal adenocarcinoma. Plan -Patient being monitored by Dr. [name] from GI. -Patient was [facility] for [facility] management and saw Dr.Kochar, Arie Sabina, MD and Dr Adria Devon.   Return to care with Dr. Irene Limbo in 5-6 weeks with CBC, CMP and CT chest abdomen pelvis  . Orders Placed This Encounter  Procedures  . CT ABDOMEN PELVIS W CONTRAST    Standing Status:   Future    Standing Expiration Date:   11/23/2017    Order Specific Question:   Reason for exam:    Answer:   re-evaluation of metastatic  non-small cell lung cancer for further treatment planning    Order Specific Question:   Preferred imaging location?    Answer:   [facility]  . CT Chest W Contrast    Standing Status:   Future    Standing Expiration Date:   08/23/2017    Order Specific Question:   If indicated for the ordered procedure, I authorize the administration of contrast media per Radiology protocol    Answer:   Yes    Order Specific Question:   Reason for Exam (SYMPTOM  OR DIAGNOSIS REQUIRED)    Answer:   re-evaluation of metastatic non-small cell lung cancer for further treatment planning    Order Specific Question:   Preferred imaging location?    Answer:   [facility]  . Basic metabolic panel    Standing Status:   Future    Standing Expiration Date:   08/23/2017  . CBC & Diff and Retic    Standing Status:   Future    Standing Expiration Date:   08/23/2017  . Comprehensive metabolic panel    Standing Status:   Future    Standing Expiration Date:   08/23/2017    All questions were answered. The patient knows to call the clinic with any problems, questions or concerns.  Sullivan Lone MD [place] Hematology/Oncology Physician [facility]  (Office): [phone] (Work cell): [phone] (Fax): [phone]

## 2016-08-23 NOTE — Progress Notes (Signed)
Treatment not given today, pt had Xgeva on 08/04/2016

## 2016-08-29 ENCOUNTER — Telehealth: Payer: Self-pay | Admitting: Hematology

## 2016-08-29 ENCOUNTER — Other Ambulatory Visit: Payer: Self-pay | Admitting: *Deleted

## 2016-08-29 NOTE — Telephone Encounter (Signed)
Appointments complete per 9/19 los. Patient stopped by for schedule today. Times for October and November moved to PM per pt.

## 2016-09-01 ENCOUNTER — Other Ambulatory Visit: Payer: Self-pay

## 2016-09-01 ENCOUNTER — Inpatient Hospital Stay (HOSPITAL_COMMUNITY)
Admission: EM | Admit: 2016-09-01 | Discharge: 2016-09-13 | DRG: 175 | Disposition: A | Discharge status: Skilled Nursing Facility | Payer: Medicare Other | Attending: Internal Medicine | Admitting: Internal Medicine

## 2016-09-01 ENCOUNTER — Encounter (HOSPITAL_COMMUNITY): Payer: Self-pay | Admitting: Emergency Medicine

## 2016-09-01 ENCOUNTER — Emergency Department (HOSPITAL_COMMUNITY): Payer: Medicare Other

## 2016-09-01 DIAGNOSIS — M84454A Pathological fracture, pelvis, initial encounter for fracture: Secondary | ICD-10-CM | POA: Diagnosis present

## 2016-09-01 DIAGNOSIS — R197 Diarrhea, unspecified: Secondary | ICD-10-CM | POA: Diagnosis present

## 2016-09-01 DIAGNOSIS — E43 Unspecified severe protein-calorie malnutrition: Secondary | ICD-10-CM | POA: Diagnosis present

## 2016-09-01 DIAGNOSIS — Z66 Do not resuscitate: Secondary | ICD-10-CM | POA: Diagnosis present

## 2016-09-01 DIAGNOSIS — J189 Pneumonia, unspecified organism: Secondary | ICD-10-CM | POA: Diagnosis not present

## 2016-09-01 DIAGNOSIS — I2699 Other pulmonary embolism without acute cor pulmonale: Secondary | ICD-10-CM | POA: Diagnosis not present

## 2016-09-01 DIAGNOSIS — K589 Irritable bowel syndrome without diarrhea: Secondary | ICD-10-CM | POA: Diagnosis present

## 2016-09-01 DIAGNOSIS — N179 Acute kidney failure, unspecified: Secondary | ICD-10-CM | POA: Diagnosis present

## 2016-09-01 DIAGNOSIS — B370 Candidal stomatitis: Secondary | ICD-10-CM

## 2016-09-01 DIAGNOSIS — E876 Hypokalemia: Secondary | ICD-10-CM | POA: Diagnosis not present

## 2016-09-01 DIAGNOSIS — C7951 Secondary malignant neoplasm of bone: Secondary | ICD-10-CM | POA: Diagnosis present

## 2016-09-01 DIAGNOSIS — E872 Acidosis: Secondary | ICD-10-CM | POA: Diagnosis present

## 2016-09-01 DIAGNOSIS — Z6821 Body mass index (BMI) 21.0-21.9, adult: Secondary | ICD-10-CM

## 2016-09-01 DIAGNOSIS — K5100 Ulcerative (chronic) pancolitis without complications: Secondary | ICD-10-CM

## 2016-09-01 DIAGNOSIS — R262 Difficulty in walking, not elsewhere classified: Secondary | ICD-10-CM

## 2016-09-01 DIAGNOSIS — L8992 Pressure ulcer of unspecified site, stage 2: Secondary | ICD-10-CM | POA: Diagnosis present

## 2016-09-01 DIAGNOSIS — Z8501 Personal history of malignant neoplasm of esophagus: Secondary | ICD-10-CM

## 2016-09-01 DIAGNOSIS — C801 Malignant (primary) neoplasm, unspecified: Secondary | ICD-10-CM | POA: Diagnosis present

## 2016-09-01 DIAGNOSIS — B379 Candidiasis, unspecified: Secondary | ICD-10-CM | POA: Diagnosis present

## 2016-09-01 DIAGNOSIS — K219 Gastro-esophageal reflux disease without esophagitis: Secondary | ICD-10-CM | POA: Diagnosis present

## 2016-09-01 DIAGNOSIS — K529 Noninfective gastroenteritis and colitis, unspecified: Secondary | ICD-10-CM

## 2016-09-01 DIAGNOSIS — N141 Nephropathy induced by other drugs, medicaments and biological substances: Secondary | ICD-10-CM | POA: Diagnosis not present

## 2016-09-01 DIAGNOSIS — C3490 Malignant neoplasm of unspecified part of unspecified bronchus or lung: Secondary | ICD-10-CM | POA: Diagnosis present

## 2016-09-01 DIAGNOSIS — Z9221 Personal history of antineoplastic chemotherapy: Secondary | ICD-10-CM

## 2016-09-01 DIAGNOSIS — R112 Nausea with vomiting, unspecified: Secondary | ICD-10-CM | POA: Diagnosis present

## 2016-09-01 DIAGNOSIS — K2270 Barrett's esophagus without dysplasia: Secondary | ICD-10-CM | POA: Diagnosis present

## 2016-09-01 DIAGNOSIS — B3781 Candidal esophagitis: Secondary | ICD-10-CM

## 2016-09-01 DIAGNOSIS — Z7189 Other specified counseling: Secondary | ICD-10-CM

## 2016-09-01 DIAGNOSIS — I4891 Unspecified atrial fibrillation: Secondary | ICD-10-CM | POA: Clinically undetermined

## 2016-09-01 DIAGNOSIS — I82403 Acute embolism and thrombosis of unspecified deep veins of lower extremity, bilateral: Secondary | ICD-10-CM | POA: Diagnosis present

## 2016-09-01 DIAGNOSIS — Z87891 Personal history of nicotine dependence: Secondary | ICD-10-CM

## 2016-09-01 DIAGNOSIS — E785 Hyperlipidemia, unspecified: Secondary | ICD-10-CM | POA: Diagnosis present

## 2016-09-01 DIAGNOSIS — I10 Essential (primary) hypertension: Secondary | ICD-10-CM | POA: Diagnosis present

## 2016-09-01 DIAGNOSIS — I82442 Acute embolism and thrombosis of left tibial vein: Secondary | ICD-10-CM | POA: Diagnosis present

## 2016-09-01 DIAGNOSIS — R079 Chest pain, unspecified: Secondary | ICD-10-CM | POA: Diagnosis not present

## 2016-09-01 DIAGNOSIS — Z79899 Other long term (current) drug therapy: Secondary | ICD-10-CM

## 2016-09-01 DIAGNOSIS — R110 Nausea: Secondary | ICD-10-CM

## 2016-09-01 DIAGNOSIS — E46 Unspecified protein-calorie malnutrition: Secondary | ICD-10-CM | POA: Diagnosis present

## 2016-09-01 DIAGNOSIS — E559 Vitamin D deficiency, unspecified: Secondary | ICD-10-CM | POA: Diagnosis present

## 2016-09-01 DIAGNOSIS — T508X5A Adverse effect of diagnostic agents, initial encounter: Secondary | ICD-10-CM | POA: Diagnosis not present

## 2016-09-01 DIAGNOSIS — F329 Major depressive disorder, single episode, unspecified: Secondary | ICD-10-CM | POA: Diagnosis present

## 2016-09-01 DIAGNOSIS — J440 Chronic obstructive pulmonary disease with acute lower respiratory infection: Secondary | ICD-10-CM | POA: Diagnosis present

## 2016-09-01 DIAGNOSIS — Y95 Nosocomial condition: Secondary | ICD-10-CM | POA: Diagnosis present

## 2016-09-01 DIAGNOSIS — K22711 Barrett's esophagus with high grade dysplasia: Secondary | ICD-10-CM | POA: Diagnosis present

## 2016-09-01 DIAGNOSIS — Z515 Encounter for palliative care: Secondary | ICD-10-CM

## 2016-09-01 DIAGNOSIS — N170 Acute kidney failure with tubular necrosis: Secondary | ICD-10-CM | POA: Diagnosis present

## 2016-09-01 DIAGNOSIS — L8990 Pressure ulcer of unspecified site, unspecified stage: Secondary | ICD-10-CM | POA: Diagnosis present

## 2016-09-01 DIAGNOSIS — Z923 Personal history of irradiation: Secondary | ICD-10-CM

## 2016-09-01 DIAGNOSIS — I82411 Acute embolism and thrombosis of right femoral vein: Secondary | ICD-10-CM | POA: Diagnosis present

## 2016-09-01 DIAGNOSIS — E860 Dehydration: Secondary | ICD-10-CM | POA: Diagnosis present

## 2016-09-01 LAB — CBC
HEMATOCRIT: 35.2 % — AB (ref 36.0–46.0)
HEMATOCRIT: 33.1 % — AB (ref 36.0–46.0)
HEMOGLOBIN: 10.9 g/dL — AB (ref 12.0–15.0)
Hemoglobin: 11.6 g/dL — ABNORMAL LOW (ref 12.0–15.0)
MCH: 27.8 pg (ref 26.0–34.0)
MCH: 27.7 pg (ref 26.0–34.0)
MCHC: 33 g/dL (ref 30.0–36.0)
MCHC: 32.9 g/dL (ref 30.0–36.0)
MCV: 84.2 fL (ref 78.0–100.0)
MCV: 84 fL (ref 78.0–100.0)
Platelets: 173 10*3/uL (ref 150–400)
Platelets: 165 10*3/uL (ref 150–400)
RBC: 4.18 MIL/uL (ref 3.87–5.11)
RBC: 3.94 MIL/uL (ref 3.87–5.11)
RDW: 14.9 % (ref 11.5–15.5)
RDW: 14.8 % (ref 11.5–15.5)
WBC: 12.6 10*3/uL — AB (ref 4.0–10.5)
WBC: 11.7 10*3/uL — ABNORMAL HIGH (ref 4.0–10.5)

## 2016-09-01 LAB — COMPREHENSIVE METABOLIC PANEL
ALT: 102 U/L — ABNORMAL HIGH (ref 14–54)
ANION GAP: 10 (ref 5–15)
AST: 81 U/L — ABNORMAL HIGH (ref 15–41)
Albumin: 2.6 g/dL — ABNORMAL LOW (ref 3.5–5.0)
Alkaline Phosphatase: 63 U/L (ref 38–126)
BILIRUBIN TOTAL: 0.9 mg/dL (ref 0.3–1.2)
BUN: 33 mg/dL — ABNORMAL HIGH (ref 6–20)
CHLORIDE: 106 mmol/L (ref 101–111)
CO2: 18 mmol/L — ABNORMAL LOW (ref 22–32)
Calcium: 7.6 mg/dL — ABNORMAL LOW (ref 8.9–10.3)
Creatinine, Ser: 1.48 mg/dL — ABNORMAL HIGH (ref 0.44–1.00)
GFR, EST AFRICAN AMERICAN: 38 mL/min — AB (ref >60)
GFR, EST NON AFRICAN AMERICAN: 33 mL/min — AB (ref >60)
Glucose, Bld: 88 mg/dL (ref 65–99)
POTASSIUM: 2.5 mmol/L — AB (ref 3.5–5.1)
Sodium: 134 mmol/L — ABNORMAL LOW (ref 135–145)
TOTAL PROTEIN: 6 g/dL — AB (ref 6.5–8.1)

## 2016-09-01 LAB — LIPASE, BLOOD: LIPASE: 26 U/L (ref 11–51)

## 2016-09-01 LAB — I-STAT TROPONIN, ED: Troponin i, poc: 0.02 ng/mL (ref 0.00–0.08)

## 2016-09-01 LAB — URINE MICROSCOPIC-ADD ON

## 2016-09-01 LAB — URINALYSIS, ROUTINE W REFLEX MICROSCOPIC
Bilirubin Urine: NEGATIVE
GLUCOSE, UA: NEGATIVE mg/dL
KETONES UR: NEGATIVE mg/dL
NITRITE: POSITIVE — AB
PH: 6 (ref 5.0–8.0)
PROTEIN: 30 mg/dL — AB
Specific Gravity, Urine: 1.015 (ref 1.005–1.030)

## 2016-09-01 LAB — MAGNESIUM
MAGNESIUM: 1.3 mg/dL — AB (ref 1.7–2.4)
Magnesium: 2.3 mg/dL (ref 1.7–2.4)

## 2016-09-01 LAB — CREATININE, SERUM
Creatinine, Ser: 1.21 mg/dL — ABNORMAL HIGH (ref 0.44–1.00)
GFR calc Af Amer: 49 mL/min — ABNORMAL LOW (ref >60)
GFR, EST NON AFRICAN AMERICAN: 42 mL/min — AB (ref >60)

## 2016-09-01 LAB — TROPONIN I: Troponin I: <0.03 ng/mL (ref <0.03)

## 2016-09-01 MED ORDER — HYDRALAZINE HCL 20 MG/ML IJ SOLN: 10.0000 mg | INTRAMUSCULAR | Status: DC | PRN

## 2016-09-01 MED ORDER — POTASSIUM CHLORIDE CRYS ER 20 MEQ PO TBCR
10.0000 meq | EXTENDED_RELEASE_TABLET | Freq: Every day | ORAL | Status: DC
  Administered 2016-09-02 – 2016-09-04 (×3): 10 meq via ORAL
  Filled 2016-09-01 (×3): qty 1

## 2016-09-01 MED ORDER — ONDANSETRON HCL 4 MG PO TABS: 8.0000 mg | ORAL_TABLET | Freq: Three times a day (TID) | ORAL | Status: DC | PRN

## 2016-09-01 MED ORDER — SODIUM CHLORIDE 0.9% FLUSH
10.0000 mL | INTRAVENOUS | Status: DC | PRN
  Administered 2016-09-04 – 2016-09-10 (×4): 10 mL
  Filled 2016-09-01 (×4): qty 40

## 2016-09-01 MED ORDER — LORAZEPAM 1 MG PO TABS
1.0000 mg | ORAL_TABLET | Freq: Three times a day (TID) | ORAL | Status: DC | PRN
  Administered 2016-09-03 – 2016-09-10 (×5): 1 mg via ORAL
  Filled 2016-09-01 (×5): qty 1

## 2016-09-01 MED ORDER — POTASSIUM CHLORIDE 10 MEQ/100ML IV SOLN
10.0000 meq | INTRAVENOUS | Status: AC
  Administered 2016-09-01 (×4): 10 meq via INTRAVENOUS
  Filled 2016-09-01 (×4): qty 100

## 2016-09-01 MED ORDER — DEXTROSE 5 % IV SOLN
2.0000 g | Freq: Once | INTRAVENOUS | Status: AC
  Administered 2016-09-01: 2 g via INTRAVENOUS
  Filled 2016-09-01: qty 2

## 2016-09-01 MED ORDER — POTASSIUM CHLORIDE CRYS ER 20 MEQ PO TBCR
40.0000 meq | EXTENDED_RELEASE_TABLET | Freq: Once | ORAL | Status: AC
  Administered 2016-09-01: 40 meq via ORAL
  Filled 2016-09-01: qty 2

## 2016-09-01 MED ORDER — SODIUM CHLORIDE 0.9 % IV BOLUS (SEPSIS)
1000.0000 mL | Freq: Once | INTRAVENOUS | Status: AC
Start: 2016-09-01 — End: 2016-09-01
  Administered 2016-09-01: 1000 mL via INTRAVENOUS

## 2016-09-01 MED ORDER — VANCOMYCIN HCL 500 MG IV SOLR
500.0000 mg | Freq: Two times a day (BID) | INTRAVENOUS | Status: DC
  Administered 2016-09-01 – 2016-09-03 (×5): 500 mg via INTRAVENOUS
  Filled 2016-09-01 (×6): qty 500

## 2016-09-01 MED ORDER — AMLODIPINE BESYLATE 5 MG PO TABS
5.0000 mg | ORAL_TABLET | Freq: Every day | ORAL | Status: DC
  Administered 2016-09-02 – 2016-09-13 (×12): 5 mg via ORAL
  Filled 2016-09-01 (×12): qty 1

## 2016-09-01 MED ORDER — ENOXAPARIN SODIUM 40 MG/0.4ML ~~LOC~~ SOLN
40.0000 mg | Freq: Every day | SUBCUTANEOUS | Status: DC
  Administered 2016-09-01: 40 mg via SUBCUTANEOUS
  Filled 2016-09-01: qty 0.4

## 2016-09-01 MED ORDER — ONDANSETRON HCL 4 MG PO TABS
4.0000 mg | ORAL_TABLET | Freq: Four times a day (QID) | ORAL | Status: DC | PRN
Start: 2016-09-01 — End: 2016-09-05

## 2016-09-01 MED ORDER — PANTOPRAZOLE SODIUM 40 MG PO TBEC
40.0000 mg | DELAYED_RELEASE_TABLET | Freq: Every day | ORAL | Status: DC
  Administered 2016-09-02 – 2016-09-05 (×4): 40 mg via ORAL
  Filled 2016-09-01 (×4): qty 1

## 2016-09-01 MED ORDER — OXYCODONE-ACETAMINOPHEN 5-325 MG PO TABS
1.0000 | ORAL_TABLET | ORAL | Status: DC | PRN
  Administered 2016-09-01 – 2016-09-12 (×23): 1 via ORAL
  Filled 2016-09-01 (×23): qty 1

## 2016-09-01 MED ORDER — CITALOPRAM HYDROBROMIDE 20 MG PO TABS
20.0000 mg | ORAL_TABLET | Freq: Every day | ORAL | Status: DC
  Administered 2016-09-02 – 2016-09-13 (×12): 20 mg via ORAL
  Filled 2016-09-01 (×12): qty 1

## 2016-09-01 MED ORDER — SODIUM CHLORIDE 0.9 % IV SOLN
INTRAVENOUS | Status: AC
  Administered 2016-09-01 – 2016-09-02 (×3): via INTRAVENOUS

## 2016-09-01 MED ORDER — MAGNESIUM SULFATE 2 GM/50ML IV SOLN
2.0000 g | Freq: Once | INTRAVENOUS | Status: AC
  Administered 2016-09-01: 2 g via INTRAVENOUS
  Filled 2016-09-01: qty 50

## 2016-09-01 MED ORDER — ACETAMINOPHEN 650 MG RE SUPP: 650.0000 mg | Freq: Four times a day (QID) | RECTAL | Status: DC | PRN

## 2016-09-01 MED ORDER — ZOLPIDEM TARTRATE 5 MG PO TABS
5.0000 mg | ORAL_TABLET | Freq: Every evening | ORAL | Status: DC | PRN
  Administered 2016-09-01 – 2016-09-12 (×8): 5 mg via ORAL
  Filled 2016-09-01 (×8): qty 1

## 2016-09-01 MED ORDER — ACETAMINOPHEN 325 MG PO TABS
650.0000 mg | ORAL_TABLET | Freq: Four times a day (QID) | ORAL | Status: DC | PRN
  Administered 2016-09-03: 650 mg via ORAL
  Filled 2016-09-01: qty 2

## 2016-09-01 MED ORDER — LACTINEX PO CHEW
1.0000 | CHEWABLE_TABLET | Freq: Three times a day (TID) | ORAL | Status: DC
  Administered 2016-09-02 – 2016-09-13 (×31): 1 via ORAL
  Filled 2016-09-01 (×47): qty 1

## 2016-09-01 MED ORDER — DEXTROSE 5 % IV SOLN
1.0000 g | Freq: Three times a day (TID) | INTRAVENOUS | Status: DC
  Administered 2016-09-02 – 2016-09-03 (×4): 1 g via INTRAVENOUS
  Filled 2016-09-01 (×5): qty 1

## 2016-09-01 MED ORDER — DIPHENOXYLATE-ATROPINE 2.5-0.025 MG PO TABS
1.0000 | ORAL_TABLET | Freq: Four times a day (QID) | ORAL | Status: DC | PRN
  Administered 2016-09-02 – 2016-09-03 (×3): 2 via ORAL
  Administered 2016-09-03 – 2016-09-04 (×2): 1 via ORAL
  Administered 2016-09-04: 2 via ORAL
  Administered 2016-09-04: 1 via ORAL
  Administered 2016-09-04: 2 via ORAL
  Administered 2016-09-05 – 2016-09-09 (×2): 1 via ORAL
  Administered 2016-09-11 (×2): 2 via ORAL
  Filled 2016-09-01 (×2): qty 2
  Filled 2016-09-01 (×2): qty 1
  Filled 2016-09-01 (×2): qty 2
  Filled 2016-09-01 (×3): qty 1
  Filled 2016-09-01 (×3): qty 2
  Filled 2016-09-01: qty 1

## 2016-09-01 MED ORDER — MESALAMINE 1.2 G PO TBEC
2.4000 g | DELAYED_RELEASE_TABLET | Freq: Every day | ORAL | Status: DC
  Administered 2016-09-02 – 2016-09-06 (×5): 2.4 g via ORAL
  Filled 2016-09-01 (×5): qty 2

## 2016-09-01 MED ORDER — ONDANSETRON HCL 4 MG/2ML IJ SOLN
4.0000 mg | Freq: Four times a day (QID) | INTRAMUSCULAR | Status: DC | PRN
  Administered 2016-09-03 – 2016-09-05 (×7): 4 mg via INTRAVENOUS
  Filled 2016-09-01 (×7): qty 2

## 2016-09-01 MED ORDER — MORPHINE SULFATE (PF) 4 MG/ML IV SOLN
4.0000 mg | Freq: Once | INTRAVENOUS | Status: AC
  Administered 2016-09-01: 4 mg via INTRAVENOUS
  Filled 2016-09-01: qty 1

## 2016-09-01 NOTE — ED Triage Notes (Addendum)
Pt from home with complaints of left upper quadrant pain and diarrhea that began today per EMS. Pt has hx of lung cancer. Pt is not undergoing treatment at this time. Pt does not wear oxygen at baseline, but is on 3 L by ems. Pt states this began 5 years ago and feels like her IBS pain. Pt states "she never has firm bowel movements"

## 2016-09-01 NOTE — ED Provider Notes (Signed)
[facility] DEPT Provider Note   CSN: [id number] Arrival date & time: 09/01/16  1703     History   Chief Complaint Chief Complaint  Patient presents with  . Abdominal Pain  . Diarrhea    HPI Cindy Byrd is a 75 y.o. female.  Cindy Byrd is a 75 y.o. Female with a history of metastatic non-small cell lung cancer who presents to the emergency department complaining of sudden onset of left lower chest pain to left upper quadrant abdominal pain since yesterday. Patient reports she began having pain yesterday that increases with deep inspiration and palpation of her left lower chest wall. She's been feeling more short of breath. She does not use oxygen at home. She is currently not on treatment for her lung cancer. Patient has persistent diarrhea. She reports episode of diarrhea today. Patient denies fevers, increased coughing, hemoptysis, hematemesis, hematochezia, urinary symptoms or trauma.   The history is provided by the patient and medical records. No language interpreter was used.  Abdominal Pain   Associated symptoms include diarrhea (Chronic.). Pertinent negatives include fever, nausea, vomiting, dysuria and headaches.  Diarrhea   Associated symptoms include abdominal pain. Pertinent negatives include no vomiting, no chills, no headaches and no cough.    Past Medical History:  Diagnosis Date  . Barrett's esophagus   . Bone neoplasm 06/24/2015  . Cancer [facility])    metastatic poorly differentiated carcinoma. tumor left groin surgical removal with radiation tx.  . Cataract    BILATERAL  . Cigarette smoker two packs a day or less    Currently still smoking 2 PPD - Not interested in quitting at this time.  . Colitis 2017  . Colon polyps    hyperplastic, tubular adenomas, tubulovillous adenoma  . Cough, persistent    hx. lung cancer ? primary-being evaluated, unsure of primary site.  . Depression 06/24/2015  . Diverticulosis   . Emphysema of lung ([place])   .  Endometriosis    Hysterectomy with BSO at age 89 yrs  . Esophageal adenocarcinoma ([place]) 08/11/15   intramucosal  . Gastritis   . GERD (gastroesophageal reflux disease)   . H/O: pneumonia   . Heavy smoker (more than 20 cigarettes per day) 06/24/2015  . Hiatal hernia   . Hyperlipidemia   . Hypertension 06/24/2015   likely improved incidental to 40 lbs weight loss from her neoplasm. No Longer taking med for this as of 08-06-15  . IBS (irritable bowel syndrome)   . Pain    left hip-persistent"tumor of bone"-radiation tx. 10.  . Pulmonary nodules   . Swelling of ankle    bilateral  . Vitamin D deficiency disease     Patient Active Problem List   Diagnosis Date Noted  . HCAP (healthcare-associated pneumonia) 09/01/2016  . Nausea with vomiting 07/18/2016  . Dehydration 07/18/2016  . Hypokalemia 07/18/2016  . Hypoalbuminemia due to protein-calorie malnutrition ([place]) 07/18/2016  . Peripheral edema 07/18/2016  . Diarrhea due to drug 05/19/2016  . Port catheter in place 04/07/2016  . Nicotine addiction 01/19/2016  . Barrett's esophagus determined by biopsy 09/04/2015  . Gastritis   . Metastatic lung cancer (metastasis from lung to other site) ([place]) 08/06/2015  . Esophageal reflux 08/04/2015  . Bone metastasis ([place]) 06/24/2015  . Neoplasm related pain 06/24/2015  . Protein calorie malnutrition ([place]) 06/24/2015  . Anorexia 06/24/2015  . Heavy smoker (more than 20 cigarettes per day) 06/24/2015  . Depression 06/24/2015  . HTN (hypertension) 06/24/2015    Past Surgical  History:  Procedure Laterality Date  . ABDOMINAL HYSTERECTOMY    . BARTHOLIN GLAND CYST EXCISION  75 yo ago   Does not want if it was an infected cyst or tumor. Was soon as delivery  . COLONOSCOPY W/ POLYPECTOMY     multiple times - last done 09/2014 per patient.  . ESOPHAGOGASTRODUODENOSCOPY (EGD) WITH PROPOFOL N/A 08/11/2015   Procedure: ESOPHAGOGASTRODUODENOSCOPY (EGD) WITH PROPOFOL;  Surgeon: [name], MD;   Location: [facility] ENDOSCOPY;  Service: Gastroenterology;  Laterality: N/A;  . GANGLION CYST EXCISION    . KNEE ARTHROSCOPY  age about 65 yrs  . TONSILLECTOMY    . TOTAL ABDOMINAL HYSTERECTOMY W/ BILATERAL SALPINGOOPHORECTOMY  at age 69 yrs   For endometriosis    OB History    No data available       Home Medications    Prior to Admission medications   Medication Sig Start Date End Date Taking? Authorizing Provider  amLODipine (NORVASC) 5 MG tablet TAKE 1 TABLET (5 MG TOTAL) BY MOUTH DAILY. 02/06/16  Yes [name], MD  citalopram (CELEXA) 20 MG tablet Take 1 tablet (20 mg total) by mouth daily. 06/27/16  Yes [name], MD  clotrimazole-betamethasone (LOTRISONE) cream Apply 1 application topically 2 (two) times daily. Apply to perianal skin twice daily. 06/15/16  Yes [name], MD  diphenoxylate-atropine (LOMOTIL) 2.5-0.025 MG tablet Take 1-2 tablets by mouth 4 (four) times daily as needed for diarrhea or loose stools. 08/23/16  Yes [name], MD  furosemide (LASIX) 20 MG tablet Take 20 mg by mouth 2 (two) times daily.   Yes Historical Provider, MD  lactobacillus acidophilus & bulgar (LACTINEX) chewable tablet Chew 1 tablet by mouth 3 (three) times daily with meals. 05/26/16  Yes [name], MD  lidocaine-prilocaine (EMLA) cream Apply small amount over port 1-2 hours prior to treatment, cover with plastic wrap (DO NOT RUB IN). 08/27/15  Yes Gautam Juleen China, MD  LORazepam (ATIVAN) 1 MG tablet Take 1 tablet (1 mg total) by mouth every 8 (eight) hours as needed for anxiety (or nausea). 08/19/16  Yes [name], MD  mesalamine (LIALDA) 1.2 g EC tablet Take 2 tablets (2.4 g total) by mouth daily with breakfast. 06/15/16  Yes [name], MD  potassium chloride (MICRO-K) 10 MEQ CR capsule Take 10 mEq by mouth daily.  08/17/16  Yes Historical Provider, MD  PRESCRIPTION MEDICATION Antibody Plan [place]   Yes Historical Provider, MD  zolpidem (AMBIEN CR) 12.5  MG CR tablet Take 1 tablet (12.5 mg total) by mouth at bedtime as needed for sleep. 08/05/16  Yes [place], MD  NICOTINE STEP 1 21 MG/24HR patch PLACE 1 PATCH (21 MG TOTAL) ONTO THE SKIN DAILY. Patient not taking: Reported on 09/01/2016 03/05/16   [name], MD  omeprazole (PRILOSEC) 40 MG capsule TAKE ONE CAPSULE BY MOUTH EVERY DAY *NEEDS OFFICE VISIT FOR FURTHER REFILL* 07/06/16   [name], MD  ondansetron (ZOFRAN) 8 MG tablet Take 1 tablet (8 mg total) by mouth every 8 (eight) hours as needed for nausea or vomiting. 07/15/16   Susanne Borders, NP  oxyCODONE-acetaminophen (PERCOCET/ROXICET) 5-325 MG tablet Take 1 tablet by mouth every 4 (four) hours as needed for severe pain. 07/26/16   [name], MD  polycarbophil (FIBERCON) 625 MG tablet Take 2 tablets (1,250 mg total) by mouth 2 (two) times daily. Patient not taking: Reported on 09/01/2016 01/19/16   [name], MD  potassium chloride SA (K-DUR,KLOR-CON) 20 MEQ tablet Take 2 tab(23mq) po twice daily for 3 days then 2tabs once daily for 7 days then 1 tab po daily Patient not taking: Reported on 09/01/2016 08/23/16   GBrunetta Genera MD  potassium chloride SA (K-DUR,KLOR-CON) 20 MEQ tablet Take 1 tablet (20 mEq total) by mouth daily. While on lasix Patient not taking: Reported on 09/01/2016 08/23/16   GBrunetta Genera MD  predniSONE (DELTASONE) 20 MG tablet Take 1 tablet (20 mg total) by mouth daily with breakfast. Patient not taking: Reported on 09/01/2016 08/11/16   GBrunetta Genera MD  valsartan (DIOVAN) 80 MG tablet Take 80 mg by mouth daily.  02/03/16   Historical Provider, MD    Family History Family History  Problem Relation Age of Onset  . Colon cancer Brother   . Colon cancer Brother   . Stroke Mother   . Colon cancer Father   . Breast cancer Daughter 426   ER/PR+ stage II    Social History Social History  Substance Use Topics  . Smoking status: Former Smoker    Packs/day: 1.00     Years: 60.00    Types: Cigarettes    Quit date: 12/06/2015  . Smokeless tobacco: Never Used  . Alcohol use No     Allergies   Penicillins; Remeron [mirtazapine]; and Latex   Review of Systems Review of Systems  Constitutional: Negative for chills and fever.  HENT: Negative for congestion and sore throat.   Eyes: Negative for visual disturbance.  Respiratory: Positive for shortness of breath. Negative for cough, chest tightness and wheezing.   Cardiovascular: Positive for chest pain. Negative for palpitations.  Gastrointestinal: Positive for abdominal pain and diarrhea (Chronic.). Negative for nausea and vomiting.  Genitourinary: Negative for difficulty urinating and dysuria.  Musculoskeletal: Negative for back pain and neck pain.  Skin: Negative for rash.  Neurological: Negative for headaches.     Physical Exam Updated Vital Signs BP 109/64   Pulse 92   Temp 98.5 F (36.9 C) (Oral)   Resp (!) 28   SpO2 95%   Physical Exam  Constitutional: She is oriented to person, place, and time. She appears well-developed and well-nourished. No distress.  Nontoxic appearing.  HENT:  Head: Normocephalic and atraumatic.  Mouth/Throat: Oropharynx is clear and moist.  Mucous membranes appear slightly dry.  Eyes: Conjunctivae are normal. Pupils are equal, round, and reactive to light. Right eye exhibits no discharge. Left eye exhibits no discharge.  Neck: Neck supple.  Cardiovascular: Normal rate, regular rhythm, normal heart sounds and intact distal pulses.   Pulmonary/Chest: Effort normal. No respiratory distress. She has no rales. She exhibits tenderness.  Slight increased work of breathing. Patient speaking in complete sentences. Oxygen saturation 99% on 3 L via nasal cannula. Symmetric chest expansion bilaterally. Diminished lung sounds to bilateral bases. No wheezes noted. Left lateral chest wall tenderness to palpation without crepitus.  Abdominal: Soft. Bowel sounds are normal. She  exhibits no distension. There is no tenderness. There is no guarding.  Abdomen is soft and nontender to palpation.  Musculoskeletal: She exhibits no tenderness.  Lymphadenopathy:    She has no cervical adenopathy.  Neurological: She is alert and oriented to person, place, and time. Coordination normal.  Skin: Skin is warm and dry. Capillary refill takes less than 2 seconds. No rash noted. She is not diaphoretic. No erythema. No pallor.  Psychiatric: She has a normal mood and affect. Her behavior is normal.  Nursing note  and vitals reviewed.    ED Treatments / Results  Labs (all labs ordered are listed, but only abnormal results are displayed) Labs Reviewed  COMPREHENSIVE METABOLIC PANEL - Abnormal; Notable for the following:       Result Value   Sodium 134 (*)    Potassium 2.5 (*)    CO2 18 (*)    BUN 33 (*)    Creatinine, Ser 1.48 (*)    Calcium 7.6 (*)    Total Protein 6.0 (*)    Albumin 2.6 (*)    AST 81 (*)    ALT 102 (*)    GFR calc non Af Amer 33 (*)    GFR calc Af Amer 38 (*)    All other components within normal limits  CBC - Abnormal; Notable for the following:    WBC 12.6 (*)    Hemoglobin 11.6 (*)    HCT 35.2 (*)    All other components within normal limits  MAGNESIUM - Abnormal; Notable for the following:    Magnesium 1.3 (*)    All other components within normal limits  LIPASE, BLOOD  URINALYSIS, ROUTINE W REFLEX MICROSCOPIC (NOT AT [facility])  I-STAT TROPOININ, ED    EKG  EKG Interpretation  Date/Time:  Thursday September 01 2016 17:18:10 EDT Ventricular Rate:  92 PR Interval:    QRS Duration: 94 QT Interval:  478 QTC Calculation: 572 R Axis:   39 Text Interpretation:  Sinus rhythm Atrial premature complexes in couplets Probable left atrial enlargement Repol abnrm suggests ischemia, inferior leads Prolonged QT interval no sig ischemia. Confirmed by Gerald Leitz ([id number]) on 09/01/2016 8:10:15 PM       Radiology Dg Chest 2 View  Result Date:  09/01/2016 CLINICAL DATA:  Left lower chest abdominal pain beginning today. Dyspnea. Metastatic lung carcinoma. EXAM: CHEST  2 VIEW COMPARISON:  10/03/2014 FINDINGS: The heart size and mediastinal contours remain within normal limits. Aortic atherosclerosis. Right-sided power port remains in appropriate position. New airspace opacity is seen in the left lower lobe, suspicious for pneumonia. There is new linear opacity in the right lower lung, suspicious for atelectasis or scarring. No evidence of pneumothorax or pleural effusion. IMPRESSION: New airspace opacity in left lower lobe, suspicious for pneumonia. New right lower lung atelectasis versus scarring. Electronically Signed   By: Earle Gell M.D.   On: 09/01/2016 17:53    Procedures Procedures (including critical care time)  Medications Ordered in ED Medications  potassium chloride 10 mEq in 100 mL IVPB (10 mEq Intravenous New Bag/Given 09/01/16 1959)  vancomycin (VANCOCIN) 500 mg in sodium chloride 0.9 % 100 mL IVPB (not administered)  sodium chloride 0.9 % bolus 1,000 mL (1,000 mLs Intravenous New Bag/Given 09/01/16 1800)  morphine 4 MG/ML injection 4 mg (4 mg Intravenous Given 09/01/16 1859)  aztreonam (AZACTAM) 2 g in dextrose 5 % 50 mL IVPB (2 g Intravenous New Bag/Given 09/01/16 1858)  magnesium sulfate IVPB 2 g 50 mL (2 g Intravenous New Bag/Given 09/01/16 1958)  potassium chloride SA (K-DUR,KLOR-CON) CR tablet 40 mEq (40 mEq Oral Given 09/01/16 1958)     Initial Impression / Assessment and Plan / ED Course  I have reviewed the triage vital signs and the nursing notes.  Pertinent labs & imaging results that were available during my care of the patient were reviewed by me and considered in my medical decision making (see chart for details).  Clinical Course   This  is a 75 y.o. Female with a history of  metastatic non-small cell lung cancer who presents to the emergency department complaining of sudden onset of left lower chest pain to  left upper quadrant abdominal pain since yesterday. Patient reports she began having pain yesterday that increases with deep inspiration and palpation of her left lower chest wall. She's been feeling more short of breath. She does not use oxygen at home. She is currently not on treatment for her lung cancer. Patient has persistent diarrhea. She reports episode of diarrhea today. On exam the patient is afebrile nontoxic appearing. She does have some slight increased work of breathing. Patient is still able to speak in complete sentences. She has a new oxygen requirement and her oxygen saturations 92% on 3 L via nasal cannula. Patient reports improvement with oxygen. Her abdomen is soft and nontender to palpation. She has left lower chest wall tenderness to palpation. No crepitus. Lung sounds diminished to her bilateral bases. Chest x-ray reveals new air space opacity in the left lower lobe, suspicious for pneumonia. Patient's white count is also elevated. Will go ahead and treat for healthcare associated pneumonia. Other blood work is still pending. CMP reveals hypokalemia with a potassium of 2.5. Orders for potassium replacement and magnesium replacement placed.  Will admit to medicine. Patient agrees with plan for admission.  I consulted with Dr. Rozann Lesches who accepted the patient for admission. I discussed that we suspect pneumonia due to CXR and elevated white count but if symptoms do not improve I would also consider CTA chest to rule out PE. He accepted the patient for admission and requested tele temp orders.   This patient was discussed with and evaluated by Dr. Thomasene Lot who agrees with assessment and plan.   Final Clinical Impressions(s) / ED Diagnoses   Final diagnoses:  HCAP (healthcare-associated pneumonia)  Hypokalemia    New Prescriptions New Prescriptions   No medications on file     Waynetta Pean, PA-C 09/01/16 2110    Courteney Lyn Mackuen, MD 09/03/16 2019

## 2016-09-01 NOTE — Progress Notes (Signed)
Pharmacy Antibiotic Note  75 yo female presented to ER with LUQ pain and diarrhea. 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 Patient currently not receiving any chemotherapy but is receiving Xgeva for bone mets. To start vancomycin per pharmacy for possible PNA. Also Aztreonam 2g IV x 1 ordered by ED provider - note that PCN allergy is a childhood allergy so Cefepime would be an appropriate choice rather than aztreonam.  Plan: Vancomycin '500mg'$   IV every q12 hours.  Goal trough 15-20 mcg/mL.  Aztreonam 2g x 1 ordered - gram negative coverage needs to be continued but would recommend cefepime rather than continue aztreonam    Temp (24hrs), Avg:98.5 F (36.9 C), Min:98.5 F (36.9 C), Max:98.5 F (36.9 C)   Recent Labs Lab 09/01/16 1808  WBC 12.6*    Estimated Creatinine Clearance: 51.3 mL/min (by C-G formula based on SCr of 0.9 mg/dL).    Allergies  Allergen Reactions  . Penicillins Other (See Comments)    Unknown; childhood allergy  . Remeron [Mirtazapine] Other (See Comments)    nightmares  . Latex Rash    Thank you for allowing pharmacy to be a part of this patient's care.   Adrian Saran, PharmD, BCPS Pager [phone] 09/01/2016 6:46 PM

## 2016-09-01 NOTE — H&P (Signed)
History and Physical    SAXON CROSBY ZHG:[id number] DOB: [birth year] DOA: 09/01/2016  PCP: Tamsen Roers, MD  Patient coming from: Home.  Chief Complaint: Chest pain.  HPI: Cindy Byrd is a 75 y.o. 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 Patient has been having pleuritic type of chest pain over the last 2 days. Chest pain is mostly in the left lower part of the chest and in the left upper quadrant of the abdomen. 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 Chest x-ray in the ER shows possible infiltrates. Patient states she has been having chest pain along with some productive cough but denies any fever or chills. Patient is being admitted for chest pain most likely from pneumonia.  ED Course: Patient was empirically started on antibiotics for pneumonia. Since patient also had hypokalemia potassium replacement was done.  Review of Systems: As per HPI, rest all negative.   Past Medical History:  Diagnosis Date  . Barrett's esophagus   . Bone neoplasm 06/24/2015  . Cancer [facility])    metastatic poorly differentiated carcinoma. tumor left groin surgical removal with radiation tx.  . Cataract    BILATERAL  . Cigarette smoker two packs a day or less    Currently still smoking 2 PPD - Not interested in quitting at this time.  . Colitis 2017  . Colon polyps    hyperplastic, tubular adenomas, tubulovillous adenoma  . Cough, persistent    hx. lung cancer ? primary-being evaluated, unsure of primary site.  . Depression 06/24/2015  . Diverticulosis   . Emphysema of lung ([place])   . Endometriosis    Hysterectomy with BSO at age 71 yrs  . Esophageal adenocarcinoma ([place]) 08/11/15   intramucosal  . Gastritis   . GERD (gastroesophageal reflux disease)   . H/O: pneumonia     . Heavy smoker (more than 20 cigarettes per day) 06/24/2015  . Hiatal hernia   . Hyperlipidemia   . Hypertension 06/24/2015   likely improved incidental to 40 lbs weight loss from her neoplasm. No Longer taking med for this as of 08-06-15  . IBS (irritable bowel syndrome)   . Pain    left hip-persistent"tumor of bone"-radiation tx. 10.  . Pulmonary nodules   . Swelling of ankle    bilateral  . Vitamin D deficiency disease     Past Surgical History:  Procedure Laterality Date  . ABDOMINAL HYSTERECTOMY    . BARTHOLIN GLAND CYST EXCISION  75 yo ago   Does not want if it was an infected cyst or tumor. Was soon as delivery  . COLONOSCOPY W/ POLYPECTOMY     multiple times - last done 09/2014 per patient.  . ESOPHAGOGASTRODUODENOSCOPY (EGD) WITH PROPOFOL N/A 08/11/2015   Procedure: ESOPHAGOGASTRODUODENOSCOPY (EGD) WITH PROPOFOL;  Surgeon: [name], MD;  Location: [facility];  Service: Gastroenterology;  Laterality: N/A;  . GANGLION CYST EXCISION    . KNEE ARTHROSCOPY  age about 50 yrs  . TONSILLECTOMY    . TOTAL ABDOMINAL HYSTERECTOMY W/ BILATERAL SALPINGOOPHORECTOMY  at age 15 yrs   For endometriosis     reports that she quit smoking about 8 months ago. Her smoking use included Cigarettes. She has a 60.00 pack-year smoking history. She has never used smokeless tobacco. She reports that she does not drink alcohol or use drugs.  Allergies  Allergen Reactions  . Penicillins Other (See Comments)    Unknown; childhood allergy  . Remeron [Mirtazapine] Other (See Comments)    nightmares  . Latex Rash    Family History  Problem Relation Age of Onset  . Colon cancer Brother   . Colon cancer Brother   . Stroke Mother   . Colon cancer Father   . Breast cancer Daughter 10    ER/PR+ stage II    Prior to Admission medications   Medication Sig Start Date End Date Taking? Authorizing Provider  amLODipine (NORVASC) 5 MG tablet TAKE 1 TABLET (5 MG TOTAL) BY MOUTH DAILY. 02/06/16  Yes  [name], MD  citalopram (CELEXA) 20 MG tablet Take 1 tablet (20 mg total) by mouth daily. 06/27/16  Yes [name], MD  clotrimazole-betamethasone (LOTRISONE) cream Apply 1 application topically 2 (two) times daily. Apply to perianal skin twice daily. 06/15/16  Yes [name], MD  diphenoxylate-atropine (LOMOTIL) 2.5-0.025 MG tablet Take 1-2 tablets by mouth 4 (four) times daily as needed for diarrhea or loose stools. 08/23/16  Yes [name], MD  furosemide (LASIX) 20 MG tablet Take 20 mg by mouth 2 (two) times daily.   Yes Historical Provider, MD  lactobacillus acidophilus & bulgar (LACTINEX) chewable tablet Chew 1 tablet by mouth 3 (three) times daily with meals. 05/26/16  Yes [name], MD  lidocaine-prilocaine (EMLA) cream Apply small amount over port 1-2 hours prior to treatment, cover with plastic wrap (DO NOT RUB IN). 08/27/15  Yes Gautam Juleen China, MD  LORazepam (ATIVAN) 1 MG tablet Take 1 tablet (1 mg total) by mouth every 8 (eight) hours as needed for anxiety (or nausea). 08/19/16  Yes [name], MD  mesalamine (LIALDA) 1.2 g EC tablet Take 2 tablets (2.4 g total) by mouth daily with breakfast. 06/15/16  Yes [name], MD  potassium chloride (MICRO-K) 10 MEQ CR capsule Take 10 mEq by mouth daily.  08/17/16  Yes Historical Provider, MD  PRESCRIPTION MEDICATION Antibody Plan [place]   Yes Historical Provider, MD  zolpidem (AMBIEN CR) 12.5 MG CR tablet Take 1 tablet (12.5 mg total) by mouth at bedtime as needed for sleep. 08/05/16  Yes Verona Walk, MD  NICOTINE STEP 1 21 MG/24HR patch PLACE 1 PATCH (21 MG TOTAL) ONTO THE SKIN DAILY. Patient not taking: Reported on 09/01/2016 03/05/16   [name], MD  omeprazole (PRILOSEC) 40 MG capsule TAKE ONE CAPSULE BY MOUTH EVERY DAY *NEEDS OFFICE VISIT FOR FURTHER REFILL* 07/06/16   [name], MD  ondansetron (ZOFRAN) 8 MG tablet Take 1 tablet (8 mg total) by mouth every 8 (eight) hours as  needed for nausea or vomiting. 07/15/16   Susanne Borders, NP  oxyCODONE-acetaminophen (PERCOCET/ROXICET) 5-325 MG tablet Take 1 tablet by mouth every 4 (four) hours as needed for severe pain. 07/26/16   [name], MD  polycarbophil (FIBERCON) 625 MG tablet Take 2 tablets (1,250 mg total) by mouth 2 (two) times daily. Patient not taking: Reported on 09/01/2016 01/19/16   [name], MD  potassium chloride SA (K-DUR,KLOR-CON) 20 MEQ tablet Take 2 tab(25mq) po twice daily for 3 days then 2tabs once daily for 7 days then 1 tab po daily Patient not taking: Reported on 09/01/2016 08/23/16   GBrunetta Genera MD  potassium chloride SA (K-DUR,KLOR-CON) 20 MEQ tablet Take 1 tablet (20 mEq total) by mouth daily. While on lasix Patient not taking: Reported  on 09/01/2016 08/23/16   [name], MD  predniSONE (DELTASONE) 20 MG tablet Take 1 tablet (20 mg total) by mouth daily with breakfast. Patient not taking: Reported on 09/01/2016 08/11/16   [name], MD  valsartan (DIOVAN) 80 MG tablet Take 80 mg by mouth daily.  02/03/16   Historical Provider, MD    Physical Exam: Vitals:   09/01/16 1930 09/01/16 2000 09/01/16 2030 09/01/16 2203  BP: (!) 112/48 (!) 116/47 109/64 (!) 117/56  Pulse: 82 88 92 92  Resp: 22 21 (!) 28 (!) 22  Temp:    99.2 F (37.3 C)  TempSrc:    Oral  SpO2: 95% 91% 95% 96%  Weight:    132 lb 11.5 oz (60.2 kg)  Height:    '5\' 8"'$  (1.727 m)      Constitutional: Not in distress. Moderately built and nourished. Vitals:   09/01/16 1930 09/01/16 2000 09/01/16 2030 09/01/16 2203  BP: (!) 112/48 (!) 116/47 109/64 (!) 117/56  Pulse: 82 88 92 92  Resp: 22 21 (!) 28 (!) 22  Temp:    99.2 F (37.3 C)  TempSrc:    Oral  SpO2: 95% 91% 95% 96%  Weight:    132 lb 11.5 oz (60.2 kg)  Height:    '5\' 8"'$  (1.727 m)   Eyes: Anicteric no pallor. ENMT: No discharge from the ears eyes nose or mouth. Neck: No mass felt. No JVD appreciated. Respiratory: No rhonchi or  crepitations. Cardiovascular: S1 and S2 heard no murmur appreciated. Abdomen: Mild tenderness in the left upper quadrant. No guarding or rigidity. Musculoskeletal: No edema. No joint effusion. Skin: No rash. Skin is warm. Neurologic: No facial asymmetry moves all extremities 5 x 5. Psychiatric: Alert awake oriented to time place and person. Appears normal normal affect.   Labs on Admission: I have personally reviewed following labs and imaging studies  CBC:  Recent Labs Lab 09/01/16 1808  WBC 12.6*  HGB 11.6*  HCT 35.2*  MCV 84.2  PLT 086   Basic Metabolic Panel:  Recent Labs Lab 09/01/16 1808  NA 134*  K 2.5*  CL 106  CO2 18*  GLUCOSE 88  BUN 33*  CREATININE 1.48*  CALCIUM 7.6*  MG 1.3*   GFR: Estimated Creatinine Clearance: 30.7 mL/min (by C-G formula based on SCr of 1.48 mg/dL (H)). Liver Function Tests:  Recent Labs Lab 09/01/16 1808  AST 81*  ALT 102*  ALKPHOS 63  BILITOT 0.9  PROT 6.0*  ALBUMIN 2.6*    Recent Labs Lab 09/01/16 1808  LIPASE 26   No results for input(s): AMMONIA in the last 168 hours. Coagulation Profile: No results for input(s): INR, PROTIME in the last 168 hours. Cardiac Enzymes: No results for input(s): CKTOTAL, CKMB, CKMBINDEX, TROPONINI in the last 168 hours. BNP (last 3 results) No results for input(s): PROBNP in the last 8760 hours. HbA1C: No results for input(s): HGBA1C in the last 72 hours. CBG: No results for input(s): GLUCAP in the last 168 hours. Lipid Profile: No results for input(s): CHOL, HDL, LDLCALC, TRIG, CHOLHDL, LDLDIRECT in the last 72 hours. Thyroid Function Tests: No results for input(s): TSH, T4TOTAL, FREET4, T3FREE, THYROIDAB in the last 72 hours. Anemia Panel: No results for input(s): VITAMINB12, FOLATE, FERRITIN, TIBC, IRON, RETICCTPCT in the last 72 hours. Urine analysis:    Component Value Date/Time   COLORURINE AMBER (A) 09/01/2016 1719   APPEARANCEUR CLOUDY (A) 09/01/2016 1719   LABSPEC  1.015 09/01/2016 1719   PHURINE  6.0 09/01/2016 1719   GLUCOSEU NEGATIVE 09/01/2016 1719   HGBUR TRACE (A) 09/01/2016 1719   BILIRUBINUR NEGATIVE 09/01/2016 1719   KETONESUR NEGATIVE 09/01/2016 1719   PROTEINUR 30 (A) 09/01/2016 1719   NITRITE POSITIVE (A) 09/01/2016 1719   LEUKOCYTESUR SMALL (A) 09/01/2016 1719   Sepsis Labs: '@LABRCNTIP'$ (procalcitonin:4,lacticidven:4) )No results found for this or any previous visit (from the past 240 hour(s)).   Radiological Exams on Admission: Dg Chest 2 View  Result Date: 09/01/2016 CLINICAL DATA:  Left lower chest abdominal pain beginning today. Dyspnea. Metastatic lung carcinoma. EXAM: CHEST  2 VIEW COMPARISON:  10/03/2014 FINDINGS: The heart size and mediastinal contours remain within normal limits. Aortic atherosclerosis. Right-sided power port remains in appropriate position. New airspace opacity is seen in the left lower lobe, suspicious for pneumonia. There is new linear opacity in the right lower lung, suspicious for atelectasis or scarring. No evidence of pneumothorax or pleural effusion. IMPRESSION: New airspace opacity in left lower lobe, suspicious for pneumonia. New right lower lung atelectasis versus scarring. Electronically Signed   By: Earle Gell M.D.   On: 09/01/2016 17:53    EKG: Independently reviewed. Normal sinus rhythm with atrial premature complexes with QTC of 572 ms.  Assessment/Plan Principal Problem:   HCAP (healthcare-associated pneumonia) Active Problems:   HTN (hypertension)   Metastatic lung cancer (metastasis from lung to other site) ([place])   Barrett's esophagus determined by biopsy   Hypokalemia   ARF (acute renal failure) ([place])    1. Left-sided pleuritic Type of chest pain most likely from pneumonia - patient has been placed on empiric antibiotics for healthcare associated pneumonia. If patient's creatinine improves after hydration I will get CT angiogram of the chest to rule out PE or any loculation or other  causes for chest pain. Cycle cardiac markers. 2. Acute renal failure probably from diarrhea - hydrate and recheck metabolic panel. I'm holding off patient's Lasix and Cozaar. Closely follow intake output. 3. Hypokalemia probably from diarrhea - replace and recheck. Check magnesium levels. 4. Diarrhea probably from immune colitis from  Nivolumab - if diarrhea persists may consider GI consult for possible prednisone therapy which had improved previously. Patient is on when necessary Lomotil. 5. Prolonged QT interval - replace potassium check magnesium levels and avoid QT prolongation medications. 6. Metastatic lung cancer being observed at this time by oncologist.  7. Barrett's esophagus being followed by gastroenterologist Dr. [name]. 8. Hypertension - on amlodipine. Since patient is off Cozaar and Lasix I have placed patient on when necessary IV hydralazine.   DVT prophylaxis: Lovenox. Code Status: Full code.  Family Communication: Patient's husband.  Disposition Plan: Home.  Consults called: None.  Admission status: Observation.    Rise Patience MD [facility] Pager [phone].  If 7PM-7AM, please contact night-coverage [email or web address] Password [facility]  09/01/2016, 10:15 PM

## 2016-09-01 NOTE — ED Notes (Signed)
Hospitalist at bedside 

## 2016-09-01 NOTE — ED Notes (Signed)
Unable to collect labs PA talking with the patient

## 2016-09-01 NOTE — ED Notes (Signed)
Bed: WA08 Expected date:  Expected time:  Means of arrival:  Comments: EMS- 75yo F, LUQ pain and diarrhea

## 2016-09-02 ENCOUNTER — Observation Stay (HOSPITAL_BASED_OUTPATIENT_CLINIC_OR_DEPARTMENT_OTHER): Payer: Medicare Other

## 2016-09-02 ENCOUNTER — Observation Stay (HOSPITAL_COMMUNITY): Payer: Medicare Other

## 2016-09-02 DIAGNOSIS — E876 Hypokalemia: Secondary | ICD-10-CM | POA: Diagnosis not present

## 2016-09-02 DIAGNOSIS — I2699 Other pulmonary embolism without acute cor pulmonale: Secondary | ICD-10-CM

## 2016-09-02 DIAGNOSIS — L8990 Pressure ulcer of unspecified site, unspecified stage: Secondary | ICD-10-CM | POA: Diagnosis present

## 2016-09-02 DIAGNOSIS — I82442 Acute embolism and thrombosis of left tibial vein: Secondary | ICD-10-CM | POA: Diagnosis not present

## 2016-09-02 DIAGNOSIS — I82411 Acute embolism and thrombosis of right femoral vein: Secondary | ICD-10-CM | POA: Diagnosis not present

## 2016-09-02 DIAGNOSIS — M84454A Pathological fracture, pelvis, initial encounter for fracture: Secondary | ICD-10-CM | POA: Diagnosis not present

## 2016-09-02 DIAGNOSIS — K2270 Barrett's esophagus without dysplasia: Secondary | ICD-10-CM | POA: Diagnosis not present

## 2016-09-02 DIAGNOSIS — K5100 Ulcerative (chronic) pancolitis without complications: Secondary | ICD-10-CM | POA: Diagnosis not present

## 2016-09-02 DIAGNOSIS — K529 Noninfective gastroenteritis and colitis, unspecified: Secondary | ICD-10-CM | POA: Diagnosis not present

## 2016-09-02 DIAGNOSIS — J440 Chronic obstructive pulmonary disease with acute lower respiratory infection: Secondary | ICD-10-CM | POA: Diagnosis not present

## 2016-09-02 DIAGNOSIS — E43 Unspecified severe protein-calorie malnutrition: Secondary | ICD-10-CM | POA: Diagnosis not present

## 2016-09-02 DIAGNOSIS — I82403 Acute embolism and thrombosis of unspecified deep veins of lower extremity, bilateral: Secondary | ICD-10-CM

## 2016-09-02 DIAGNOSIS — B379 Candidiasis, unspecified: Secondary | ICD-10-CM | POA: Diagnosis present

## 2016-09-02 DIAGNOSIS — C801 Malignant (primary) neoplasm, unspecified: Secondary | ICD-10-CM | POA: Diagnosis not present

## 2016-09-02 DIAGNOSIS — E559 Vitamin D deficiency, unspecified: Secondary | ICD-10-CM | POA: Diagnosis present

## 2016-09-02 DIAGNOSIS — I824Y3 Acute embolism and thrombosis of unspecified deep veins of proximal lower extremity, bilateral: Secondary | ICD-10-CM | POA: Diagnosis not present

## 2016-09-02 DIAGNOSIS — I4891 Unspecified atrial fibrillation: Secondary | ICD-10-CM | POA: Diagnosis present

## 2016-09-02 DIAGNOSIS — R197 Diarrhea, unspecified: Secondary | ICD-10-CM | POA: Diagnosis not present

## 2016-09-02 DIAGNOSIS — N170 Acute kidney failure with tubular necrosis: Secondary | ICD-10-CM | POA: Diagnosis not present

## 2016-09-02 DIAGNOSIS — K51018 Ulcerative (chronic) pancolitis with other complication: Secondary | ICD-10-CM | POA: Diagnosis not present

## 2016-09-02 DIAGNOSIS — J189 Pneumonia, unspecified organism: Secondary | ICD-10-CM | POA: Diagnosis not present

## 2016-09-02 DIAGNOSIS — E872 Acidosis: Secondary | ICD-10-CM | POA: Diagnosis not present

## 2016-09-02 DIAGNOSIS — R079 Chest pain, unspecified: Secondary | ICD-10-CM | POA: Diagnosis present

## 2016-09-02 DIAGNOSIS — K22711 Barrett's esophagus with high grade dysplasia: Secondary | ICD-10-CM | POA: Diagnosis present

## 2016-09-02 DIAGNOSIS — Y95 Nosocomial condition: Secondary | ICD-10-CM | POA: Diagnosis present

## 2016-09-02 DIAGNOSIS — N179 Acute kidney failure, unspecified: Secondary | ICD-10-CM | POA: Diagnosis not present

## 2016-09-02 DIAGNOSIS — Z6821 Body mass index (BMI) 21.0-21.9, adult: Secondary | ICD-10-CM | POA: Diagnosis not present

## 2016-09-02 DIAGNOSIS — I10 Essential (primary) hypertension: Secondary | ICD-10-CM | POA: Diagnosis not present

## 2016-09-02 DIAGNOSIS — E860 Dehydration: Secondary | ICD-10-CM | POA: Diagnosis present

## 2016-09-02 DIAGNOSIS — C3490 Malignant neoplasm of unspecified part of unspecified bronchus or lung: Secondary | ICD-10-CM | POA: Diagnosis not present

## 2016-09-02 DIAGNOSIS — Z515 Encounter for palliative care: Secondary | ICD-10-CM | POA: Diagnosis present

## 2016-09-02 DIAGNOSIS — C7951 Secondary malignant neoplasm of bone: Secondary | ICD-10-CM | POA: Diagnosis not present

## 2016-09-02 HISTORY — DX: Acute embolism and thrombosis of unspecified deep veins of lower extremity, bilateral: I82.403

## 2016-09-02 HISTORY — DX: Other pulmonary embolism without acute cor pulmonale: I26.99

## 2016-09-02 LAB — ECHOCARDIOGRAM COMPLETE
HEIGHTINCHES: 68 in
Weight: 2130.53 oz

## 2016-09-02 LAB — COMPREHENSIVE METABOLIC PANEL
ALBUMIN: 2.3 g/dL — AB (ref 3.5–5.0)
ALT: 89 U/L — ABNORMAL HIGH (ref 14–54)
AST: 64 U/L — AB (ref 15–41)
Alkaline Phosphatase: 58 U/L (ref 38–126)
Anion gap: 6 (ref 5–15)
BILIRUBIN TOTAL: 0.5 mg/dL (ref 0.3–1.2)
BUN: 28 mg/dL — AB (ref 6–20)
CHLORIDE: 113 mmol/L — AB (ref 101–111)
CO2: 18 mmol/L — ABNORMAL LOW (ref 22–32)
Calcium: 7 mg/dL — ABNORMAL LOW (ref 8.9–10.3)
Creatinine, Ser: 1.06 mg/dL — ABNORMAL HIGH (ref 0.44–1.00)
GFR calc Af Amer: 58 mL/min — ABNORMAL LOW (ref >60)
GFR calc non Af Amer: 50 mL/min — ABNORMAL LOW (ref >60)
GLUCOSE: 100 mg/dL — AB (ref 65–99)
POTASSIUM: 3.7 mmol/L (ref 3.5–5.1)
Sodium: 137 mmol/L (ref 135–145)
Total Protein: 5.3 g/dL — ABNORMAL LOW (ref 6.5–8.1)

## 2016-09-02 LAB — CBC WITH DIFFERENTIAL/PLATELET
Basophils Absolute: 0 10*3/uL (ref 0.0–0.1)
Basophils Relative: 0 %
Eosinophils Absolute: 0 10*3/uL (ref 0.0–0.7)
Eosinophils Relative: 0 %
HEMATOCRIT: 32.2 % — AB (ref 36.0–46.0)
Hemoglobin: 10.7 g/dL — ABNORMAL LOW (ref 12.0–15.0)
LYMPHS ABS: 1.6 10*3/uL (ref 0.7–4.0)
Lymphocytes Relative: 14 %
MCH: 28 pg (ref 26.0–34.0)
MCHC: 33.2 g/dL (ref 30.0–36.0)
MCV: 84.3 fL (ref 78.0–100.0)
MONOS PCT: 9 %
Monocytes Absolute: 1 10*3/uL (ref 0.1–1.0)
NEUTROS ABS: 8.8 10*3/uL — AB (ref 1.7–7.7)
Neutrophils Relative %: 77 %
Platelets: 185 10*3/uL (ref 150–400)
RBC: 3.82 MIL/uL — ABNORMAL LOW (ref 3.87–5.11)
RDW: 14.8 % (ref 11.5–15.5)
WBC: 11.4 10*3/uL — ABNORMAL HIGH (ref 4.0–10.5)

## 2016-09-02 LAB — TROPONIN I

## 2016-09-02 LAB — C DIFFICILE QUICK SCREEN W PCR REFLEX
C DIFFICILE (CDIFF) TOXIN: NEGATIVE
C DIFFICLE (CDIFF) ANTIGEN: NEGATIVE
C Diff interpretation: NOT DETECTED

## 2016-09-02 LAB — APTT: aPTT: 43 seconds — ABNORMAL HIGH (ref 24–36)

## 2016-09-02 LAB — PROTIME-INR
INR: 1.44
Prothrombin Time: 17.7 seconds — ABNORMAL HIGH (ref 11.4–15.2)

## 2016-09-02 MED ORDER — ENOXAPARIN SODIUM 60 MG/0.6ML ~~LOC~~ SOLN
60.0000 mg | Freq: Two times a day (BID) | SUBCUTANEOUS | Status: DC
  Administered 2016-09-02 – 2016-09-04 (×6): 60 mg via SUBCUTANEOUS
  Filled 2016-09-02 (×6): qty 0.6

## 2016-09-02 MED ORDER — IOPAMIDOL (ISOVUE-370) INJECTION 76%
100.0000 mL | Freq: Once | INTRAVENOUS | Status: AC | PRN
  Administered 2016-09-02: 100 mL via INTRAVENOUS

## 2016-09-02 NOTE — Progress Notes (Signed)
ANTICOAGULATION CONSULT NOTE - Initial Consult  Pharmacy Consult for Lovenox Indication: pulmonary embolus  Allergies  Allergen Reactions  . Penicillins Other (See Comments)    Unknown; childhood allergy  . Remeron [Mirtazapine] Other (See Comments)    nightmares  . Latex Rash    Patient Measurements: Height: '5\' 8"'$  (172.7 cm) Weight: 133 lb 2.5 oz (60.4 kg) IBW/kg (Calculated) : 63.9  Vital Signs: Temp: 98.3 F (36.8 C) (09/29 0636) Temp Source: Oral (09/29 0636) BP: 132/54 (09/29 0636) Pulse Rate: 80 (09/29 0636)  Labs:  Recent Labs  09/01/16 1808 09/01/16 2235 09/02/16 0415  HGB 11.6* 10.9* 10.7*  HCT 35.2* 33.1* 32.2*  PLT 173 165 185  CREATININE 1.48* 1.21* 1.06*  TROPONINI  --  <0.03 <0.03    Estimated Creatinine Clearance: 43.1 mL/min (by C-G formula based on SCr of 1.06 mg/dL (H)).  Medical History: Past Medical History:  Diagnosis Date  . Barrett's esophagus   . Bone neoplasm 06/24/2015  . Cancer [facility])    metastatic poorly differentiated carcinoma. tumor left groin surgical removal with radiation tx.  . Cataract    BILATERAL  . Cigarette smoker two packs a day or less    Currently still smoking 2 PPD - Not interested in quitting at this time.  . Colitis 2017  . Colon polyps    hyperplastic, tubular adenomas, tubulovillous adenoma  . Cough, persistent    hx. lung cancer ? primary-being evaluated, unsure of primary site.  . Depression 06/24/2015  . Diverticulosis   . Emphysema of lung ([place])   . Endometriosis    Hysterectomy with BSO at age 35 yrs  . Esophageal adenocarcinoma ([place]) 08/11/15   intramucosal  . Gastritis   . GERD (gastroesophageal reflux disease)   . H/O: pneumonia   . Heavy smoker (more than 20 cigarettes per day) 06/24/2015  . Hiatal hernia   . Hyperlipidemia   . Hypertension 06/24/2015   likely improved incidental to 40 lbs weight loss from her neoplasm. No Longer taking med for this as of 08-06-15  . IBS (irritable bowel  syndrome)   . Pain    left hip-persistent"tumor of bone"-radiation tx. 10.  . Pulmonary nodules   . Swelling of ankle    bilateral  . Vitamin D deficiency disease     Medications:  Scheduled:  . amLODipine  5 mg Oral Daily  . aztreonam  1 g Intravenous Q8H  . citalopram  20 mg Oral Daily  . lactobacillus acidophilus & bulgar  1 tablet Oral TID WC  . mesalamine  2.4 g Oral Q breakfast  . pantoprazole  40 mg Oral Daily  . potassium chloride SA  10 mEq Oral Daily  . vancomycin  500 mg Intravenous Q12H   Infusions:  . sodium chloride 125 mL/hr at 09/02/16 0434    Assessment: 75 yo female presented to ER with LUQ pain and diarrhea. 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 Patient currently not receiving any chemotherapy but is receiving Xgeva for bone mets.  She is known to pharmacy for antibiotic consults for possible PNA. CT now shows acute bilateral PE.  Pharmacy is consulted to dose Lovenox.  Prophylactic Lovenox '40mg'$  started on admission; last given SQ on 9/28 at ~2300.  Today, 09/02/2016:  Baseline coags: pending  CBC:  Hgb is low/stable at 10.7, Plt remain WNL  SCr 1.06, improving with CrCl ~ 43 ml/min  Goal of Therapy:  Anti-Xa level 0.6-1 units/ml 4hrs after LMWH dose given Monitor platelets by anticoagulation protocol: Yes   Plan:  Lovenox 60 mg SQ q12h Monitor CBC, renal funcion   Gretta Arab PharmD, BCPS Pager [phone] 09/02/2016,10:21 AM

## 2016-09-02 NOTE — Progress Notes (Signed)
*  Preliminary Results* Bilateral lower extremity venous duplex completed. Bilateral lower extremities are positive for acute deep vein thrombosis involving the right common femoral vein, right femoral vein, right profunda femoral vein, right popliteal vein, right gastrocnemius veins, right posterior tibial veins, right peroneal veins, left gastrocnemius veins, left posterior tibial veins, and left peroneal veins. There is no evidence of Baker's cyst bilaterally.  Preliminary results discussed with Dr. Karleen Hampshire and Archer Asa, RN.  09/02/2016 2:44 PM Maudry Mayhew, BS, RVT, RDCS, RDMS

## 2016-09-02 NOTE — Care Management Note (Signed)
Case Management Note  Patient Details  Name: Cindy Byrd MRN: [id number] Date of Birth: Mar 21, 1941  Subjective/Objective: Left xarelto discount coupon in rm. Patient too tired to discuss coupon. Will have weekend CM to discuss if d/c over weekend.                   Action/Plan:d/c home.   Expected Discharge Date:   (unknown)               Expected Discharge Plan:  [place]  In-House Referral:     Discharge planning Services  CM Consult  Post Acute Care Choice:  Durable Medical Equipment (cane, rw,3n1) Choice offered to:     DME Arranged:    DME Agency:     HH Arranged:    HH Agency:     Status of Service:  In process, will continue to follow  If discussed at Long Length of Stay Meetings, dates discussed:    Additional Comments:  Dessa Phi, RN 09/02/2016, 3:53 PM

## 2016-09-02 NOTE — Care Management Obs Status (Signed)
[place] NOTIFICATION   Patient Details  Name: PAILYNN VAHEY MRN: [id number] Date of Birth: 19-Sep-1941   Medicare Observation Status Notification Given:  Yes    MahabirJuliann Pulse, RN 09/02/2016, 10:17 AM

## 2016-09-02 NOTE — Progress Notes (Signed)
PROGRESS NOTE    Cindy Byrd  AGT:[id number] DOB: 16-Oct-1941 DOA: 09/01/2016 PCP: Tamsen Roers, MD   Brief Narrative: Cindy Byrd is a 75 y.o. 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    Assessment & Plan:   Principal Problem:   HCAP (healthcare-associated pneumonia) Active Problems:   HTN (hypertension)   Metastatic lung cancer (metastasis from lung to other site) ([name])   Barrett's esophagus determined by biopsy   Hypokalemia   ARF (acute renal failure) (HCC)   Pressure injury of skin   Bilateral PE, DVT in the rigth common femoral vein: Currently on lovenox, possibly long term.    Metastatic adenocarcinoma; further management as per Dr Irene Limbo.    Pneumonia: Currently on vancomycin and aztreonam.  Blood cultures nt done on admission.    Acute renal failure: Improved with hydration.  Monitor renal parameters.   Hypertension: controlled.   Chronic diarrhea worsened after chemo: Watch electrolytes.      DVT prophylaxis: LOVENOX.  Code Status: (Full) Family Communication: family at bedside.  Disposition Plan: possibly in 1 to 2 days.    Consultants:   None.    Procedures: none   Antimicrobials: vancomycin and cefepime.   Subjective: reports some chest tightness and chest pain on deep breathing  Objective: Vitals:   09/01/16 2030 09/01/16 2203 09/02/16 0636 09/02/16 1549  BP: 109/64 (!) 117/56 (!) 132/54 (!) 108/52  Pulse: 92 92 80 73  Resp: (!) 28 (!) '22 20 20  '$ Temp:  99.2 F (37.3 C) 98.3 F (36.8 C) 98.5 F (36.9 C)  TempSrc:  Oral Oral Oral  SpO2: 95% 96% 99% 100%  Weight:  60.2 kg (132 lb 11.5 oz) 60.4 kg (133 lb 2.5 oz)   Height:  '5\' 8"'$  (1.727 m)      Intake/Output Summary (Last 24 hours) at 09/02/16 1648 Last data filed at 09/02/16 1416  Gross per 24 hour    Intake          1693.75 ml  Output              300 ml  Net          1393.75 ml   Filed Weights   09/01/16 2203 09/02/16 0636  Weight: 60.2 kg (132 lb 11.5 oz) 60.4 kg (133 lb 2.5 oz)    Examination:  General exam: Appears calm and comfortable  Respiratory system: Clear to auscultation. Respiratory effort normal. Cardiovascular system: S1 & S2 heard, RRR. No JVD, murmurs, rubs, gallops or clicks. No pedal edema. Gastrointestinal system: Abdomen is nondistended, soft and nontender. No organomegaly or masses felt. Normal bowel sounds heard. Central nervous system: Alert and oriented. No focal neurological deficits. Extremities: Symmetric 5 x 5 power. Skin: No rashes, lesions or ulcers Psychiatry: Judgement and insight appear normal. Mood & affect appropriate.     Data Reviewed: I have personally reviewed following labs and imaging studies  CBC:  Recent Labs Lab 09/01/16 1808 09/01/16 2235 09/02/16 0415  WBC 12.6* 11.7* 11.4*  NEUTROABS  --   --  8.8*  HGB 11.6* 10.9* 10.7*  HCT 35.2* 33.1* 32.2*  MCV 84.2 84.0 84.3  PLT 173 165 224   Basic Metabolic Panel:  Recent Labs Lab 09/01/16 1808 09/01/16 2235 09/02/16 0415  NA 134*  --  137  K 2.5*  --  3.7  CL 106  --  113*  CO2 18*  --  18*  GLUCOSE 88  --  100*  BUN 33*  --  28*  CREATININE 1.48* 1.21* 1.06*  CALCIUM 7.6*  --  7.0*  MG 1.3* 2.3  --    GFR: Estimated Creatinine Clearance: 43.1 mL/min (by C-G formula based on SCr of 1.06 mg/dL (H)). Liver Function Tests:  Recent Labs Lab 09/01/16 1808 09/02/16 0415  AST 81* 64*  ALT 102* 89*  ALKPHOS 63 58  BILITOT 0.9 0.5  PROT 6.0* 5.3*  ALBUMIN 2.6* 2.3*    Recent Labs Lab 09/01/16 1808  LIPASE 26   No results for input(s): AMMONIA in the last 168 hours. Coagulation Profile:  Recent Labs Lab 09/02/16 1030  INR 1.44   Cardiac Enzymes:  Recent Labs Lab 09/01/16 2235 09/02/16 0415 09/02/16 1030  TROPONINI <0.03 <0.03 <0.03   BNP  (last 3 results) No results for input(s): PROBNP in the last 8760 hours. HbA1C: No results for input(s): HGBA1C in the last 72 hours. CBG: No results for input(s): GLUCAP in the last 168 hours. Lipid Profile: No results for input(s): CHOL, HDL, LDLCALC, TRIG, CHOLHDL, LDLDIRECT in the last 72 hours. Thyroid Function Tests: No results for input(s): TSH, T4TOTAL, FREET4, T3FREE, THYROIDAB in the last 72 hours. Anemia Panel: No results for input(s): VITAMINB12, FOLATE, FERRITIN, TIBC, IRON, RETICCTPCT in the last 72 hours. Sepsis Labs: No results for input(s): PROCALCITON, LATICACIDVEN in the last 168 hours.  No results found for this or any previous visit (from the past 240 hour(s)).       Radiology Studies: Dg Chest 2 View  Result Date: 09/01/2016 CLINICAL DATA:  Left lower chest abdominal pain beginning today. Dyspnea. Metastatic lung carcinoma. EXAM: CHEST  2 VIEW COMPARISON:  10/03/2014 FINDINGS: The heart size and mediastinal contours remain within normal limits. Aortic atherosclerosis. Right-sided power port remains in appropriate position. New airspace opacity is seen in the left lower lobe, suspicious for pneumonia. There is new linear opacity in the right lower lung, suspicious for atelectasis or scarring. No evidence of pneumothorax or pleural effusion. IMPRESSION: New airspace opacity in left lower lobe, suspicious for pneumonia. New right lower lung atelectasis versus scarring. Electronically Signed   By: Earle Gell M.D.   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 EXAM: CT ANGIOGRAPHY CHEST WITH CONTRAST TECHNIQUE: Multidetector CT imaging of the chest was performed using the standard protocol during bolus  administration of intravenous contrast. Multiplanar CT image reconstructions and MIPs were obtained to evaluate the vascular anatomy. CONTRAST:  100 mL Isovue 370 COMPARISON:  CT chest 06/24/2016, 04/19/2016, 01/14/2016 FINDINGS: Cardiovascular: Satisfactory opacification of the pulmonary arteries to the segmental level. Occlusive pulmonary embolus in the left lower lobe pulmonary artery. Pulmonary embolus in the right lower lobe segmental pulmonary artery. Normal heart size. No pericardial effusion. Thoracic aortic atherosclerosis. Mediastinum/Nodes: No enlarged mediastinal, hilar, or axillary lymph nodes. Thyroid gland, trachea, and esophagus demonstrate no significant findings. Lungs/Pleura: Small left pleural effusion. Right basilar airspace disease which may reflect atelectasis versus pneumonia versus pulmonary infarction. Left basilar disease which may reflect atelectasis versus pneumonia versus less likely developing pulmonary infarction. Bilateral centrilobular emphysema. Upper Abdomen: No acute upper abdominal abnormality. Small hiatal hernia. Musculoskeletal: No acute osseous abnormality. No aggressive  lytic or sclerotic osseous lesion Review of the MIP images confirms the above findings. IMPRESSION: 1. Acute bilateral pulmonary emboli as described above. Positive for acute PE with CT evidence of right heart strain (RV/LV Ratio = 1.07) consistent with at least submassive (intermediate risk) PE. The presence of right heart strain has been associated with an increased risk of morbidity and mortality. Please activate Code PE by paging [phone]. 2. Right basilar airspace disease which may reflect atelectasis versus pneumonia versus pulmonary infarction. Left basilar disease which may reflect atelectasis versus pneumonia versus less likely developing pulmonary infarction. 3.  Aortic Atherosclerosis (ICD10-170.0) 4.  Emphysema. (SUO15-I15.9) Critical Value/emergent results were called by telephone at the  time of interpretation on 09/02/2016 at 10:06 am to Dr. Karleen Hampshire, who verbally acknowledged these results. Electronically Signed   By: Kathreen Devoid   On: 09/02/2016 10:07        Scheduled Meds: . amLODipine  5 mg Oral Daily  . aztreonam  1 g Intravenous Q8H  . citalopram  20 mg Oral Daily  . enoxaparin (LOVENOX) injection  60 mg Subcutaneous Q12H  . lactobacillus acidophilus & bulgar  1 tablet Oral TID WC  . mesalamine  2.4 g Oral Q breakfast  . pantoprazole  40 mg Oral Daily  . potassium chloride SA  10 mEq Oral Daily  . vancomycin  500 mg Intravenous Q12H   Continuous Infusions: . sodium chloride 125 mL/hr at 09/02/16 1442     LOS: 0 days    Time spent: 30 minutes.     Hosie Poisson, MD [facility] Pager [phone]  If 7PM-7AM, please contact night-coverage [email or web address] Password [facility] 09/02/2016, 4:48 PM

## 2016-09-02 NOTE — Progress Notes (Signed)
Echocardiogram 2D Echocardiogram has been performed.  09/02/2016 3:34 PM Maudry Mayhew, BS, RVT, RDCS, RDMS

## 2016-09-02 NOTE — Care Management Note (Signed)
Case Management Note  Patient Details  Name: Cindy Byrd MRN: [id number] Date of Birth: 1941-02-20  Subjective/Objective: Per attending agree to PT cons after echo.Await recc. Provided patient w/xarelto free 30day discount coupon. On 02-continue to monitor.                  Action/Plan:d/c home   Expected Discharge Date:   (unknown)               Expected Discharge Plan:  [name]  In-House Referral:     Discharge planning Services  CM Consult  Post Acute Care Choice:  Durable Medical Equipment (cane, rw,3n1) Choice offered to:     DME Arranged:    DME Agency:     HH Arranged:    HH Agency:     Status of Service:  In process, will continue to follow  If discussed at Long Length of Stay Meetings, dates discussed:    Additional Comments:  Dessa Phi, RN 09/02/2016, 12:26 PM

## 2016-09-02 NOTE — Care Management Note (Signed)
Case Management Note  Patient Details  Name: SKYRA CRICHLOW MRN: [id number] Date of Birth: 12/11/1940  Subjective/Objective:  75 y/o f admitted w/HCAP. From home. Has cane,rw,3n1.On 02-will monitor. Would recc PT cons.                  Action/Plan:d/c plan home.   Expected Discharge Date:   (unknown)               Expected Discharge Plan:  [name]  In-House Referral:     Discharge planning Services  CM Consult  Post Acute Care Choice:  Durable Medical Equipment (cane, rw,3n1) Choice offered to:     DME Arranged:    DME Agency:     HH Arranged:    HH Agency:     Status of Service:  In process, will continue to follow  If discussed at Long Length of Stay Meetings, dates discussed:    Additional Comments:  Dessa Phi, RN 09/02/2016, 10:18 AM

## 2016-09-02 NOTE — Progress Notes (Signed)
Nutrition Brief Note  Patient identified on the Malnutrition Screening Tool (MST) Report  Wt Readings from Last 15 Encounters:  09/02/16 133 lb 2.5 oz (60.4 kg)  08/23/16 134 lb 9.6 oz (61.1 kg)  07/26/16 139 lb 9.6 oz (63.3 kg)  07/18/16 142 lb 8 oz (64.6 kg)  07/15/16 169 lb 8 oz (76.9 kg)  06/27/16 145 lb 4.8 oz (65.9 kg)  06/15/16 145 lb (65.8 kg)  06/09/16 145 lb (65.8 kg)  05/26/16 140 lb 1.6 oz (63.5 kg)  05/19/16 139 lb 8 oz (63.3 kg)  04/21/16 145 lb (65.8 kg)  03/10/16 136 lb 9.6 oz (62 kg)  02/23/16 128 lb (58.1 kg)  02/11/16 128 lb (58.1 kg)  02/11/16 128 lb 8 oz (58.3 kg)   75 y.o. 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   Patient reports she does not want to see dietitian at this time due to not feeling well and nausea. Per chart she has lost over 5 kg since July 2017.   Body mass index is 20.25 kg/m. Patient meets criteria for Normal weight based on current BMI.   Current diet order is Heart Healthy, patient is consuming approximately 5% of meals at this time. Labs and medications reviewed.   Full nutrition assessment to come on 10/2 when patient is feeling better and able to talk to RD.   Willey Blade, MS, RD, LDN Pager: [phone] After Hours Pager: [phone]

## 2016-09-03 MED ORDER — DEXTROSE 5 % IV SOLN
1.0000 g | Freq: Two times a day (BID) | INTRAVENOUS | Status: DC
  Administered 2016-09-03 – 2016-09-04 (×4): 1 g via INTRAVENOUS
  Filled 2016-09-03 (×5): qty 1

## 2016-09-03 MED ORDER — PROMETHAZINE HCL 25 MG PO TABS
12.5000 mg | ORAL_TABLET | Freq: Four times a day (QID) | ORAL | Status: DC | PRN
Start: 2016-09-03 — End: 2016-09-05
  Administered 2016-09-03 – 2016-09-05 (×2): 12.5 mg via ORAL
  Filled 2016-09-03 (×2): qty 1

## 2016-09-03 MED ORDER — SODIUM CHLORIDE 0.9 % IV SOLN
INTRAVENOUS | Status: DC
  Administered 2016-09-03: 75 mL/h via INTRAVENOUS

## 2016-09-03 NOTE — Progress Notes (Signed)
PROGRESS NOTE    [name]  QMG:[id number] DOB: 1941-06-03 DOA: 09/01/2016 PCP: Tamsen Roers, MD   Brief Narrative: Cindy Byrd is a 75 y.o. 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    Assessment & Plan:   Principal Problem:   HCAP (healthcare-associated pneumonia) Active Problems:   HTN (hypertension)   Metastatic lung cancer (metastasis from lung to other site) ([place])   Barrett's esophagus determined by biopsy   Hypokalemia   ARF (acute renal failure) (H[facility])   Pressure injury of skin   Bilateral PE, DVT in the rigth common femoral vein: Currently on lovenox, possibly long term.  She does not want to be on long tern lovenox, will talk to her about xarelto.    Metastatic adenocarcinoma; further management as per Dr Irene Limbo.    Pneumonia: Currently on vancomycin and aztreonam. Change to cefepime.   Blood cultures nt done on admission.    Acute renal failure: Improved with hydration.  Monitor renal parameters.   Hypertension: controlled.   Chronic diarrhea worsened after chemo: Watch electrolytes.  c diff negative.   Nausea, vomiting.  Get abd film.  Phenergan added on .      DVT prophylaxis: LOVENOX.  Code Status: (Full) Family Communication: family at bedside.  Disposition Plan: possibly in 1 to 2 days.    Consultants:   None.    Procedures: none   Antimicrobials: vancomycin and cefepime.   Subjective: Reports nauseated. Two episodes of vomiting.   Objective: Vitals:   09/03/16 0418 09/03/16 0950 09/03/16 1401 09/03/16 1540  BP: 136/61 (!) 131/51 129/66   Pulse: 88 92 95   Resp: '20 20 20   '$ Temp: 99.3 F (37.4 C)  (!) 101.2 F (38.4 C) 100.3 F (37.9 C)  TempSrc: Oral  Oral Oral  SpO2: 98% 100% 100%   Weight: 61.6 kg (135 lb 12.9 oz)     Height:         Intake/Output Summary (Last 24 hours) at 09/03/16 1739 Last data filed at 09/03/16 1204  Gross per 24 hour  Intake              350 ml  Output                0 ml  Net              350 ml   Filed Weights   09/01/16 2203 09/02/16 0636 09/03/16 0418  Weight: 60.2 kg (132 lb 11.5 oz) 60.4 kg (133 lb 2.5 oz) 61.6 kg (135 lb 12.9 oz)    Examination:  General exam: Appears uncomfortable, nauseated.  Respiratory system: Clear to auscultation. Respiratory effort normal. Cardiovascular system: S1 & S2 heard, RRR. No JVD, murmurs, rubs, gallops or clicks. No pedal edema. Gastrointestinal system: Abdomen is nondistended, soft , mild tender. . No organomegaly or masses felt. Normal bowel sounds heard. Central nervous system: Alert and oriented. No focal neurological deficits. Extremities: Symmetric 5 x 5 power. Skin: No rashes, lesions or ulcers     Data Reviewed: I have personally reviewed following labs and imaging studies  CBC:  Recent Labs Lab 09/01/16 1808 09/01/16 2235 09/02/16 0415  WBC 12.6* 11.7* 11.4*  NEUTROABS  --   --  8.8*  HGB 11.6* 10.9* 10.7*  HCT 35.2* 33.1* 32.2*  MCV 84.2 84.0 84.3  PLT 173 165 185  Basic Metabolic Panel:  Recent Labs Lab 09/01/16 1808 09/01/16 2235 09/02/16 0415  NA 134*  --  137  K 2.5*  --  3.7  CL 106  --  113*  CO2 18*  --  18*  GLUCOSE 88  --  100*  BUN 33*  --  28*  CREATININE 1.48* 1.21* 1.06*  CALCIUM 7.6*  --  7.0*  MG 1.3* 2.3  --    GFR: Estimated Creatinine Clearance: 43.9 mL/min (by C-G formula based on SCr of 1.06 mg/dL (H)). Liver Function Tests:  Recent Labs Lab 09/01/16 1808 09/02/16 0415  AST 81* 64*  ALT 102* 89*  ALKPHOS 63 58  BILITOT 0.9 0.5  PROT 6.0* 5.3*  ALBUMIN 2.6* 2.3*    Recent Labs Lab 09/01/16 1808  LIPASE 26   No results for input(s): AMMONIA in the last 168 hours. Coagulation Profile:  Recent Labs Lab 09/02/16 1030  INR 1.44   Cardiac Enzymes:  Recent Labs Lab  09/01/16 2235 09/02/16 0415 09/02/16 1030  TROPONINI <0.03 <0.03 <0.03   BNP (last 3 results) No results for input(s): PROBNP in the last 8760 hours. HbA1C: No results for input(s): HGBA1C in the last 72 hours. CBG: No results for input(s): GLUCAP in the last 168 hours. Lipid Profile: No results for input(s): CHOL, HDL, LDLCALC, TRIG, CHOLHDL, LDLDIRECT in the last 72 hours. Thyroid Function Tests: No results for input(s): TSH, T4TOTAL, FREET4, T3FREE, THYROIDAB in the last 72 hours. Anemia Panel: No results for input(s): VITAMINB12, FOLATE, FERRITIN, TIBC, IRON, RETICCTPCT in the last 72 hours. Sepsis Labs: No results for input(s): PROCALCITON, LATICACIDVEN in the last 168 hours.  Recent Results (from the past 240 hour(s))  C difficile quick scan w PCR reflex     Status: None   Collection Time: 09/02/16  7:15 PM  Result Value Ref Range Status   C Diff antigen NEGATIVE NEGATIVE Final   C Diff toxin NEGATIVE NEGATIVE Final   C Diff interpretation No C. difficile detected.  Final         Radiology Studies: Dg Chest 2 View  Result Date: 09/01/2016 CLINICAL DATA:  Left lower chest abdominal pain beginning today. Dyspnea. Metastatic lung carcinoma. EXAM: CHEST  2 VIEW COMPARISON:  10/03/2014 FINDINGS: The heart size and mediastinal contours remain within normal limits. Aortic atherosclerosis. Right-sided power port remains in appropriate position. New airspace opacity is seen in the left lower lobe, suspicious for pneumonia. There is new linear opacity in the right lower lung, suspicious for atelectasis or scarring. No evidence of pneumothorax or pleural effusion. IMPRESSION: New airspace opacity in left lower lobe, suspicious for pneumonia. New right lower lung atelectasis versus scarring. Electronically Signed   By: Earle Gell M.D.   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 EXAM: CT ANGIOGRAPHY CHEST WITH CONTRAST TECHNIQUE: Multidetector CT imaging of the chest was performed using the standard protocol during bolus administration of intravenous contrast. Multiplanar CT image reconstructions and MIPs were obtained to evaluate the vascular anatomy. CONTRAST:  100 mL Isovue 370 COMPARISON:  CT chest 06/24/2016, 04/19/2016, 01/14/2016 FINDINGS: Cardiovascular: Satisfactory opacification of the pulmonary arteries to the segmental level. Occlusive pulmonary embolus in the left lower lobe pulmonary artery. Pulmonary embolus in the right lower lobe  segmental pulmonary artery. Normal heart size. No pericardial effusion. Thoracic aortic atherosclerosis. Mediastinum/Nodes: No enlarged mediastinal, hilar, or axillary lymph nodes. Thyroid gland, trachea, and esophagus demonstrate no significant findings. Lungs/Pleura: Small left pleural effusion. Right basilar airspace disease which may reflect atelectasis versus pneumonia versus pulmonary infarction. Left basilar disease which may reflect atelectasis versus pneumonia versus less likely developing pulmonary infarction. Bilateral centrilobular emphysema. Upper Abdomen: No acute upper abdominal abnormality. Small hiatal hernia. Musculoskeletal: No acute osseous abnormality. No aggressive lytic or sclerotic osseous lesion Review of the MIP images confirms the above findings. IMPRESSION: 1. Acute bilateral pulmonary emboli as described above. Positive for acute PE with CT evidence of right heart strain (RV/LV Ratio = 1.07) consistent with at least submassive (intermediate risk) PE. The presence of right heart strain has been associated with an increased risk of morbidity and mortality. Please activate Code PE by paging [phone]. 2. Right basilar airspace disease which may reflect  atelectasis versus pneumonia versus pulmonary infarction. Left basilar disease which may reflect atelectasis versus pneumonia versus less likely developing pulmonary infarction. 3.  Aortic Atherosclerosis (ICD10-170.0) 4.  Emphysema. (UJW11-B14.9) Critical Value/emergent results were called by telephone at the time of interpretation on 09/02/2016 at 10:06 am to Dr. Karleen Hampshire, who verbally acknowledged these results. Electronically Signed   By: Kathreen Devoid   On: 09/02/2016 10:07        Scheduled Meds: . amLODipine  5 mg Oral Daily  . ceFEPime (MAXIPIME) IV  1 g Intravenous Q12H  . citalopram  20 mg Oral Daily  . enoxaparin (LOVENOX) injection  60 mg Subcutaneous Q12H  . lactobacillus acidophilus & bulgar  1 tablet Oral TID WC  . mesalamine  2.4 g Oral Q breakfast  . pantoprazole  40 mg Oral Daily  . potassium chloride SA  10 mEq Oral Daily  . vancomycin  500 mg Intravenous Q12H   Continuous Infusions:     LOS: 1 day    Time spent: 30 minutes.     Hosie Poisson, MD [facility] Pager [phone]  If 7PM-7AM, please contact night-coverage [email or web address] Password [facility] 09/03/2016, 5:39 PM

## 2016-09-03 NOTE — Progress Notes (Signed)
PT Cancellation Note  Patient Details Name: Cindy Byrd MRN: [id number] DOB: 1941/03/26   Cancelled Treatment:     PT order received but eval deferred at request of pt 2* extreme fatigue.  Will follow in am.   Petrice Beedy 09/03/2016, 3:32 PM

## 2016-09-03 NOTE — Progress Notes (Signed)
Pharmacy Antibiotic Note  75 yo female presented to ER with LUQ pain and diarrhea. 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 Patient currently not receiving any chemotherapy but is receiving Xgeva for bone mets.  Pharmacy was initially consulted to dose Vancomycin and Aztreonam.  However, PCN allergy is a childhood allergy so trial of cephalosporin is appropriate.  Aztreonam change to Cefepime on 9/30.  Plan:  Cefepime 1g IV q12h.  Continue Vancomycin '500mg'$  IV q12h.  Measure Vanc trough at steady state.  Follow up renal fxn, culture results, and clinical course.   Height: '5\' 8"'$  (172.7 cm) Weight: 135 lb 12.9 oz (61.6 kg) IBW/kg (Calculated) : 63.9  Temp (24hrs), Avg:98.7 F (37.1 C), Min:98.2 F (36.8 C), Max:99.3 F (37.4 C)   Recent Labs Lab 09/01/16 1808 09/01/16 2235 09/02/16 0415  WBC 12.6* 11.7* 11.4*  CREATININE 1.48* 1.21* 1.06*    Estimated Creatinine Clearance: 43.9 mL/min (by C-G formula based on SCr of 1.06 mg/dL (H)).    Allergies  Allergen Reactions  . Penicillins Other (See Comments)    Unknown; childhood allergy  . Remeron [Mirtazapine] Other (See Comments)    nightmares  . Latex Rash    Antimicrobials this admission:  9/28 Vanc >>  9/28 Aztreonam >> 9/30 9/30 Cefepime >>  Dose adjustments this admission:   Microbiology results:  9/29 Cdiff: toxin/Ag both neg Sputum:  Thank you for allowing pharmacy to be a part of this patient's care.  Gretta Arab PharmD, BCPS Pager [phone] 09/03/2016 10:36 AM

## 2016-09-03 NOTE — Progress Notes (Signed)
Pt has had a total of 5 very large episodes of vomiting and diarrhea today.  Emesis is brown liquid and so is diarrhea.  Pt unable to tolerate po.  Her last K+ was 3.7 and she does not have any IVF running.  On call notified and has placed an order for IVF.  Have given her Lomotil, Zofran and Ativan.

## 2016-09-04 ENCOUNTER — Encounter (HOSPITAL_COMMUNITY): Payer: Self-pay | Admitting: Radiology

## 2016-09-04 ENCOUNTER — Inpatient Hospital Stay (HOSPITAL_COMMUNITY): Payer: Medicare Other

## 2016-09-04 DIAGNOSIS — C801 Malignant (primary) neoplasm, unspecified: Secondary | ICD-10-CM | POA: Diagnosis present

## 2016-09-04 LAB — CBC
HEMATOCRIT: 35.8 % — AB (ref 36.0–46.0)
HEMOGLOBIN: 12.2 g/dL (ref 12.0–15.0)
MCH: 27.7 pg (ref 26.0–34.0)
MCHC: 34.1 g/dL (ref 30.0–36.0)
MCV: 81.2 fL (ref 78.0–100.0)
Platelets: 293 10*3/uL (ref 150–400)
RBC: 4.41 MIL/uL (ref 3.87–5.11)
RDW: 15.1 % (ref 11.5–15.5)
WBC: 8.3 10*3/uL (ref 4.0–10.5)

## 2016-09-04 LAB — BASIC METABOLIC PANEL
Anion gap: 10 (ref 5–15)
BUN: 22 mg/dL — AB (ref 6–20)
CHLORIDE: 113 mmol/L — AB (ref 101–111)
CO2: 16 mmol/L — ABNORMAL LOW (ref 22–32)
Calcium: 6.9 mg/dL — ABNORMAL LOW (ref 8.9–10.3)
Creatinine, Ser: 1.5 mg/dL — ABNORMAL HIGH (ref 0.44–1.00)
GFR calc Af Amer: 38 mL/min — ABNORMAL LOW (ref >60)
GFR calc non Af Amer: 33 mL/min — ABNORMAL LOW (ref >60)
GLUCOSE: 130 mg/dL — AB (ref 65–99)
POTASSIUM: 2.6 mmol/L — AB (ref 3.5–5.1)
Sodium: 139 mmol/L (ref 135–145)

## 2016-09-04 LAB — MAGNESIUM: MAGNESIUM: 1.7 mg/dL (ref 1.7–2.4)

## 2016-09-04 LAB — VANCOMYCIN, TROUGH: Vancomycin Tr: 15 ug/mL (ref 15–20)

## 2016-09-04 MED ORDER — POTASSIUM CHLORIDE CRYS ER 20 MEQ PO TBCR
40.0000 meq | EXTENDED_RELEASE_TABLET | Freq: Two times a day (BID) | ORAL | Status: AC
  Administered 2016-09-04 (×2): 40 meq via ORAL
  Filled 2016-09-04 (×2): qty 2

## 2016-09-04 MED ORDER — POTASSIUM CHLORIDE IN NACL 40-0.9 MEQ/L-% IV SOLN
INTRAVENOUS | Status: DC
  Administered 2016-09-04: 125 mL/h via INTRAVENOUS
  Administered 2016-09-04: 75 mL/h via INTRAVENOUS
  Administered 2016-09-05 – 2016-09-06 (×2): 125 mL/h via INTRAVENOUS
  Filled 2016-09-04 (×6): qty 1000

## 2016-09-04 MED ORDER — VANCOMYCIN HCL IN DEXTROSE 1-5 GM/200ML-% IV SOLN
1000.0000 mg | INTRAVENOUS | Status: DC
  Administered 2016-09-04: 1000 mg via INTRAVENOUS
  Filled 2016-09-04: qty 200

## 2016-09-04 MED ORDER — MAGNESIUM SULFATE 2 GM/50ML IV SOLN
2.0000 g | Freq: Once | INTRAVENOUS | Status: AC
  Administered 2016-09-04: 2 g via INTRAVENOUS
  Filled 2016-09-04: qty 50

## 2016-09-04 MED ORDER — POTASSIUM CHLORIDE 10 MEQ/100ML IV SOLN
10.0000 meq | INTRAVENOUS | Status: AC
  Administered 2016-09-04 (×3): 10 meq via INTRAVENOUS
  Filled 2016-09-04 (×3): qty 100

## 2016-09-04 NOTE — Progress Notes (Signed)
PT continues to have brown/clear emesis/diarrhea and can not tolerate PO's.  Started NS last night WITHOUT kcl.  K+ this AM is 2.6.  Kim on call and notified.

## 2016-09-04 NOTE — Progress Notes (Signed)
Pharmacy Antibiotic Note  75 yo female presented to ER with LUQ pain and diarrhea. 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 Patient currently not receiving any chemotherapy but is receiving Xgeva for bone mets.  Pharmacy was initially consulted to dose Vancomycin and Aztreonam.  However, PCN allergy is a childhood allergy so trial of cephalosporin is appropriate.  Aztreonam change to Cefepime on 9/30.  Today, 09/04/2016: Tmax / 24 hrs: 101.2, currently 99.1 WBC improved to WNL SCr increasing (1.06 > 1.5) with CrCl ~ 30 ml/min  Plan:  Cefepime 1g IV q12h.  Vancomycin 1g IV q24h  Measure Vanc trough at steady state.  Follow up renal fxn, culture results, and clinical course.   Height: '5\' 8"'$  (172.7 cm) Weight: 133 lb 2.5 oz (60.4 kg) IBW/kg (Calculated) : 63.9  Temp (24hrs), Avg:99.9 F (37.7 C), Min:99 F (37.2 C), Max:101.2 F (38.4 C)   Recent Labs Lab 09/01/16 1808 09/01/16 2235 09/02/16 0415 09/04/16 0426  WBC 12.6* 11.7* 11.4* 8.3  CREATININE 1.48* 1.21* 1.06* 1.50*    Estimated Creatinine Clearance: 30.4 mL/min (by C-G formula based on SCr of 1.5 mg/dL (H)).    Allergies  Allergen Reactions  . Penicillins Other (See Comments)    Unknown; childhood allergy  . Remeron [Mirtazapine] Other (See Comments)    nightmares  . Latex Rash    Antimicrobials this admission:  9/28 Vanc >>  9/28 Aztreonam >> 9/30 9/30 Cefepime >>  Dose adjustments this admission:  10/1 VT = 15 on vanc '500mg'$  q12h, SCr increasing.  Microbiology results:  9/29 Cdiff: toxin/Ag both neg 9/30 BCx: sent  Thank you for allowing pharmacy to be a part of this patient's care.  Gretta Arab PharmD, BCPS Pager [phone] 09/04/2016 9:20 AM

## 2016-09-04 NOTE — Progress Notes (Signed)
ANTICOAGULATION CONSULT NOTE - Follow up [facility] for Lovenox Indication: pulmonary embolus  Allergies  Allergen Reactions  . Penicillins Other (See Comments)    Unknown; childhood allergy  . Remeron [Mirtazapine] Other (See Comments)    nightmares  . Latex Rash    Patient Measurements: Height: '5\' 8"'$  (172.7 cm) Weight: 133 lb 2.5 oz (60.4 kg) IBW/kg (Calculated) : 63.9  Vital Signs: Temp: 99.1 F (37.3 C) (10/01 0500) Temp Source: Oral (10/01 0500) BP: 145/51 (10/01 0909) Pulse Rate: 94 (10/01 0909)  Labs:  Recent Labs  09/01/16 2235 09/02/16 0415 09/02/16 1030 09/04/16 0426  HGB 10.9* 10.7*  --  12.2  HCT 33.1* 32.2*  --  35.8*  PLT 165 185  --  293  APTT  --   --  43*  --   LABPROT  --   --  17.7*  --   INR  --   --  1.44  --   CREATININE 1.21* 1.06*  --  1.50*  TROPONINI <0.03 <0.03 <0.03  --     Estimated Creatinine Clearance: 30.4 mL/min (by C-G formula based on SCr of 1.5 mg/dL (H)).  Medical History: Past Medical History:  Diagnosis Date  . Barrett's esophagus   . Bone neoplasm 06/24/2015  . Cancer [facility])    metastatic poorly differentiated carcinoma. tumor left groin surgical removal with radiation tx.  . Cataract    BILATERAL  . Cigarette smoker two packs a day or less    Currently still smoking 2 PPD - Not interested in quitting at this time.  . Colitis 2017  . Colon polyps    hyperplastic, tubular adenomas, tubulovillous adenoma  . Cough, persistent    hx. lung cancer ? primary-being evaluated, unsure of primary site.  . Depression 06/24/2015  . Diverticulosis   . Emphysema of lung ([place])   . Endometriosis    Hysterectomy with BSO at age 66 yrs  . Esophageal adenocarcinoma ([place]) 08/11/15   intramucosal  . Gastritis   . GERD (gastroesophageal reflux disease)   . H/O: pneumonia   . Heavy smoker (more than 20 cigarettes per day) 06/24/2015  . Hiatal hernia   . Hyperlipidemia   . Hypertension 06/24/2015   likely improved  incidental to 40 lbs weight loss from her neoplasm. No Longer taking med for this as of 08-06-15  . IBS (irritable bowel syndrome)   . Pain    left hip-persistent"tumor of bone"-radiation tx. 10.  . Pulmonary nodules   . Swelling of ankle    bilateral  . Vitamin D deficiency disease     Medications:  Scheduled:  . amLODipine  5 mg Oral Daily  . ceFEPime (MAXIPIME) IV  1 g Intravenous Q12H  . citalopram  20 mg Oral Daily  . enoxaparin (LOVENOX) injection  60 mg Subcutaneous Q12H  . lactobacillus acidophilus & bulgar  1 tablet Oral TID WC  . mesalamine  2.4 g Oral Q breakfast  . pantoprazole  40 mg Oral Daily  . potassium chloride SA  10 mEq Oral Daily   Infusions:  . 0.9 % NaCl with KCl 40 mEq / L 75 mL/hr (09/04/16 0606)    Assessment: 75 yo female presented to ER with LUQ pain and diarrhea. 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 Patient currently not receiving any chemotherapy but is receiving Xgeva for bone mets.  She is known to pharmacy for antibiotic consults  for possible PNA. CT now shows acute bilateral PE.  Pharmacy is consulted to dose Lovenox.  Prophylactic Lovenox '40mg'$  started on admission; last given SQ on 9/28 at ~2300.  Today, 09/04/2016:  CBC:  Hgb improved to 12.2, Plt remain WNL  SCr increased to 1.5, CrCl ~ 30 ml/min  No bleeding or complications reported.  Goal of Therapy:  Anti-Xa level 0.6-1 units/ml 4hrs after LMWH dose given Monitor platelets by anticoagulation protocol: Yes   Plan:   Continue Lovenox 60 mg SQ q12h  Monitor CBC, renal function  Pharmacy will sign off note writing at this time, but will continue to follow labs/renal fxn peripherally and adjust dosing as needed.  Gretta Arab PharmD, BCPS Pager [phone] 09/04/2016,9:23 AM

## 2016-09-04 NOTE — Evaluation (Signed)
Physical Therapy Evaluation Patient Details Name: Cindy Byrd MRN: [id number] DOB: 03-15-1941 Today's Date: 09/04/2016   History of Present Illness  Cindy Byrd is a 75 y.o. female with metastatic non-small cell lung cancer under observation, hypertension, Barrett's esophagus, anemia and colitis  with persistent diarrhea presents to the ER 09/01/16 because of chest pain. Patient has been having pleuritic type of chest pain . CT angio reveals  acute bilateral PE and R leg DVT on 09/02/16.  Clinical Impression  The  PT evaluatyion limited due to ongoing diarrhea. Noted DOE. Newly diagnosed bil. PE and DVT in right common femoral vein. THe patient has a sacral decubitus. The patient may benefit from  [facility] consult and air rmattress to decreased sacral pressure. Encouraged patient to lie on her side as much as possible.  Pt admitted with above diagnosis. Pt currently with functional limitations due to the deficits listed below (see PT Problem List). Pt will benefit from skilled PT to increase their independence and safety with mobility to allow discharge to the venue listed below.       Follow Up Recommendations SNF;Supervision/Assistance - 24 hour    Equipment Recommendations  None recommended by PT   Recommendations for Other Services OT consult     Precautions / Restrictions Precautions Precautions: Fall Precaution Comments: diarrhea is constant      Mobility  Bed Mobility Overal bed mobility: Needs Assistance Bed Mobility: Rolling Rolling: Supervision         General bed mobility comments: only achieved  rolling due to ongoing watery diarrhea. Patient also has a  decubitus over sacrum.   Transfers  unable to accomplish attempts to sitting.                  Ambulation/Gait                Stairs            Wheelchair Mobility    Modified Rankin (Stroke Patients Only)       Balance                                            Pertinent Vitals/Pain Pain Assessment: Faces Faces Pain Scale: Hurts even more Pain Location: abdomen Pain Descriptors / Indicators: Cramping Pain Intervention(s): Limited activity within patient's tolerance;Monitored during session    Home Living Family/patient expects to be discharged to:: Private residence Living Arrangements: Spouse/significant other Available Help at Discharge: Family;Friend(s) Type of Home: House Home Access: Level entry     Home Layout: One level Home Equipment: None      Prior Function Level of Independence: Independent               Hand Dominance        Extremity/Trunk Assessment   Upper Extremity Assessment: Generalized weakness           Lower Extremity Assessment: Generalized weakness         Communication   Communication: No difficulties  Cognition Arousal/Alertness: Awake/alert Behavior During Therapy: WFL for tasks assessed/performed Overall Cognitive Status: Within Functional Limits for tasks assessed                      General Comments      Exercises     Assessment/Plan    PT Assessment Patient needs continued PT services  PT Problem List  Decreased strength;Decreased activity tolerance;Decreased mobility;Decreased knowledge of use of DME;Decreased safety awareness;Decreased knowledge of precautions;Cardiopulmonary status limiting activity          PT Treatment Interventions DME instruction;Gait training;Functional mobility training;Therapeutic activities;Therapeutic exercise;Patient/family education    PT Goals (Current goals can be found in the Care Plan section)  Acute Rehab PT Goals Patient Stated Goal: to go home PT Goal Formulation: With patient Time For Goal Achievement: 09/18/16 Potential to Achieve Goals: Good    Frequency Min 3X/week   Barriers to discharge Decreased caregiver support      Co-evaluation               End of Session   Activity Tolerance: Patient  limited by fatigue;Treatment limited secondary to medical complications (Comment) (diarrhea) Patient left: in bed;with call bell/phone within reach;with bed alarm set;with nursing/sitter in room Nurse Communication: Mobility status         Time: 3606-7703 PT Time Calculation (min) (ACUTE ONLY): 25 min   Charges:   PT Evaluation $PT Eval Moderate Complexity: 1 Procedure PT Treatments $Self Care/Home Management: 8-22   PT G Codes:        Marcelino Freestone PT [phone]  09/04/2016, 12:01 PM

## 2016-09-04 NOTE — Progress Notes (Signed)
PROGRESS NOTE    Cindy Byrd  KYH:[id number] DOB: 04-13-1941 DOA: 09/01/2016 PCP: Tamsen Roers, MD   Brief Narrative: Cindy Byrd is a 75 y.o. 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    Assessment & Plan:   Principal Problem:   HCAP (healthcare-associated pneumonia) Active Problems:   HTN (hypertension)   Metastatic lung cancer (metastasis from lung to other site) ([place])   Barrett's esophagus determined by biopsy   Hypokalemia   ARF (acute renal failure) (HCC)   Pressure injury of skin   Bilateral PE, DVT in the rigth common femoral vein: Currently on lovenox, possibly long term.     Metastatic adenocarcinoma; further management as per Dr Irene Limbo.    Pneumonia: Currently on vancomycin and aztreonam. Change to cefepime.   Blood cultures not done on admission.  Febrile yesterday, repeat blood cultures ordered.   Acute renal failure: Slightly worsended from yesterday, as she did not take any po, was nauseated and vomiting.  Hydrate and  monitor renal parameters.   Hypertension: controlled.   Chronic diarrhea worsened after chemo: Watch electrolytes.  c diff negative.  flexi seal ordered.   Nausea, vomiting. Profuse diarrhea and abdominal pain.  Phenergan added on .  CT abd and pelvis without contrast ordered.    Hypokalemia: replete and repeat in am.      DVT prophylaxis: LOVENOX.  Code Status: (Full) Family Communication: spoke to son at bedside.  Disposition Plan: possibly in 1 to 2 days.    Consultants:   None.    Procedures: none   Antimicrobials: vancomycin and cefepime.   Subjective: No vomiting, but nauseated.  Diarrhea improved.   Objective: Vitals:   09/03/16 2100 09/04/16 0500 09/04/16 0909 09/04/16 1345  BP: (!) 114/53 (!) 128/52 (!) 145/51 (!) 138/55    Pulse: (!) 105 100 94 89  Resp: '19 18 20 20  '$ Temp: 99 F (37.2 C) 99.1 F (37.3 C)  98.7 F (37.1 C)  TempSrc: Oral Oral  Oral  SpO2: 98% 100% 100% 100%  Weight:  60.4 kg (133 lb 2.5 oz)    Height:        Intake/Output Summary (Last 24 hours) at 09/04/16 1601 Last data filed at 09/04/16 1447  Gross per 24 hour  Intake          3019.58 ml  Output              700 ml  Net          2319.58 ml   Filed Weights   09/02/16 0636 09/03/16 0418 09/04/16 0500  Weight: 60.4 kg (133 lb 2.5 oz) 61.6 kg (135 lb 12.9 oz) 60.4 kg (133 lb 2.5 oz)    Examination:  General exam: Appears uncomfortable, nauseated.  Respiratory system: Clear to auscultation. Respiratory effort normal. Cardiovascular system: S1 & S2 heard, RRR. No JVD, murmurs, rubs, gallops or clicks. No pedal edema. Gastrointestinal system: Abdomen is nondistended, soft , mild tender. . No organomegaly or masses felt. Normal bowel sounds heard. Central nervous system: Alert and oriented. No focal neurological deficits. Extremities: Symmetric 5 x 5 power. Skin: No rashes, lesions or ulcers     Data Reviewed: I have personally reviewed following labs and imaging studies  CBC:  Recent Labs Lab 09/01/16 1808 09/01/16 2235 09/02/16 0415 09/04/16 0426  WBC 12.6* 11.7* 11.4* 8.3  NEUTROABS  --   --  8.8*  --   HGB 11.6* 10.9* 10.7* 12.2  HCT 35.2* 33.1* 32.2* 35.8*  MCV 84.2 84.0 84.3 81.2  PLT 173 165 185 242   Basic Metabolic Panel:  Recent Labs Lab 09/01/16 1808 09/01/16 2235 09/02/16 0415 09/04/16 0426 09/04/16 1030  NA 134*  --  137 139  --   K 2.5*  --  3.7 2.6*  --   CL 106  --  113* 113*  --   CO2 18*  --  18* 16*  --   GLUCOSE 88  --  100* 130*  --   BUN 33*  --  28* 22*  --   CREATININE 1.48* 1.21* 1.06* 1.50*  --   CALCIUM 7.6*  --  7.0* 6.9*  --   MG 1.3* 2.3  --   --  1.7   GFR: Estimated Creatinine Clearance: 30.4 mL/min (by C-G formula based on SCr of 1.5 mg/dL (H)). Liver Function  Tests:  Recent Labs Lab 09/01/16 1808 09/02/16 0415  AST 81* 64*  ALT 102* 89*  ALKPHOS 63 58  BILITOT 0.9 0.5  PROT 6.0* 5.3*  ALBUMIN 2.6* 2.3*    Recent Labs Lab 09/01/16 1808  LIPASE 26   No results for input(s): AMMONIA in the last 168 hours. Coagulation Profile:  Recent Labs Lab 09/02/16 1030  INR 1.44   Cardiac Enzymes:  Recent Labs Lab 09/01/16 2235 09/02/16 0415 09/02/16 1030  TROPONINI <0.03 <0.03 <0.03   BNP (last 3 results) No results for input(s): PROBNP in the last 8760 hours. HbA1C: No results for input(s): HGBA1C in the last 72 hours. CBG: No results for input(s): GLUCAP in the last 168 hours. Lipid Profile: No results for input(s): CHOL, HDL, LDLCALC, TRIG, CHOLHDL, LDLDIRECT in the last 72 hours. Thyroid Function Tests: No results for input(s): TSH, T4TOTAL, FREET4, T3FREE, THYROIDAB in the last 72 hours. Anemia Panel: No results for input(s): VITAMINB12, FOLATE, FERRITIN, TIBC, IRON, RETICCTPCT in the last 72 hours. Sepsis Labs: No results for input(s): PROCALCITON, LATICACIDVEN in the last 168 hours.  Recent Results (from the past 240 hour(s))  C difficile quick scan w PCR reflex     Status: None   Collection Time: 09/02/16  7:15 PM  Result Value Ref Range Status   C Diff antigen NEGATIVE NEGATIVE Final   C Diff toxin NEGATIVE NEGATIVE Final   C Diff interpretation No C. difficile detected.  Final         Radiology Studies: No results found.      Scheduled Meds: . amLODipine  5 mg Oral Daily  . ceFEPime (MAXIPIME) IV  1 g Intravenous Q12H  . citalopram  20 mg Oral Daily  . enoxaparin (LOVENOX) injection  60 mg Subcutaneous Q12H  . lactobacillus acidophilus & bulgar  1 tablet Oral TID WC  . mesalamine  2.4 g Oral Q breakfast  . pantoprazole  40 mg Oral Daily  . potassium chloride SA  40 mEq Oral BID  . vancomycin  1,000 mg Intravenous Q24H   Continuous Infusions: . 0.9 % NaCl with KCl 40 mEq / L 125 mL/hr (09/04/16  1447)     LOS: 2 days    Time spent: 30 minutes.     Hosie Poisson, MD [facility] Pager [phone]  If 7PM-7AM, please contact night-coverage [email or web address] Password [email or web address] 09/04/2016, 4:01 PM

## 2016-09-05 ENCOUNTER — Ambulatory Visit: Payer: Medicare Other | Admitting: Internal Medicine

## 2016-09-05 DIAGNOSIS — K5100 Ulcerative (chronic) pancolitis without complications: Secondary | ICD-10-CM

## 2016-09-05 DIAGNOSIS — R197 Diarrhea, unspecified: Secondary | ICD-10-CM

## 2016-09-05 DIAGNOSIS — K51018 Ulcerative (chronic) pancolitis with other complication: Secondary | ICD-10-CM

## 2016-09-05 LAB — URINE MICROSCOPIC-ADD ON

## 2016-09-05 LAB — BASIC METABOLIC PANEL
Anion gap: 7 (ref 5–15)
BUN: 35 mg/dL — AB (ref 6–20)
CO2: 14 mmol/L — ABNORMAL LOW (ref 22–32)
CREATININE: 1.84 mg/dL — AB (ref 0.44–1.00)
Calcium: 6.6 mg/dL — ABNORMAL LOW (ref 8.9–10.3)
Chloride: 116 mmol/L — ABNORMAL HIGH (ref 101–111)
GFR, EST AFRICAN AMERICAN: 30 mL/min — AB (ref >60)
GFR, EST NON AFRICAN AMERICAN: 26 mL/min — AB (ref >60)
Glucose, Bld: 120 mg/dL — ABNORMAL HIGH (ref 65–99)
POTASSIUM: 4 mmol/L (ref 3.5–5.1)
SODIUM: 137 mmol/L (ref 135–145)

## 2016-09-05 LAB — URINALYSIS, ROUTINE W REFLEX MICROSCOPIC
BILIRUBIN URINE: NEGATIVE
Glucose, UA: 100 mg/dL — AB
Hgb urine dipstick: NEGATIVE
Ketones, ur: NEGATIVE mg/dL
LEUKOCYTES UA: NEGATIVE
NITRITE: NEGATIVE
PH: 5.5 (ref 5.0–8.0)
Protein, ur: 100 mg/dL — AB
SPECIFIC GRAVITY, URINE: 1.028 (ref 1.005–1.030)

## 2016-09-05 LAB — HEPATIC FUNCTION PANEL
ALBUMIN: 2.1 g/dL — AB (ref 3.5–5.0)
ALK PHOS: 64 U/L (ref 38–126)
ALT: 77 U/L — AB (ref 14–54)
AST: 67 U/L — ABNORMAL HIGH (ref 15–41)
BILIRUBIN TOTAL: 0.5 mg/dL (ref 0.3–1.2)
Bilirubin, Direct: <0.1 mg/dL — ABNORMAL LOW (ref 0.1–0.5)
TOTAL PROTEIN: 5.8 g/dL — AB (ref 6.5–8.1)

## 2016-09-05 LAB — CREATININE, URINE, RANDOM: Creatinine, Urine: 210.22 mg/dL

## 2016-09-05 LAB — SODIUM, URINE, RANDOM: Sodium, Ur: <10 mmol/L

## 2016-09-05 LAB — LIPASE, BLOOD: Lipase: 22 U/L (ref 11–51)

## 2016-09-05 MED ORDER — BOOST / RESOURCE BREEZE PO LIQD
1.0000 | Freq: Three times a day (TID) | ORAL | Status: DC
  Administered 2016-09-05 – 2016-09-07 (×5): 1 via ORAL

## 2016-09-05 MED ORDER — CEFEPIME HCL 1 G IJ SOLR
1.0000 g | INTRAMUSCULAR | Status: DC
  Administered 2016-09-05 – 2016-09-07 (×3): 1 g via INTRAVENOUS
  Filled 2016-09-05 (×3): qty 1

## 2016-09-05 MED ORDER — ENOXAPARIN SODIUM 60 MG/0.6ML ~~LOC~~ SOLN
60.0000 mg | SUBCUTANEOUS | Status: DC
  Administered 2016-09-05 – 2016-09-08 (×4): 60 mg via SUBCUTANEOUS
  Filled 2016-09-05 (×4): qty 0.6

## 2016-09-05 MED ORDER — PROMETHAZINE HCL 25 MG PO TABS
25.0000 mg | ORAL_TABLET | Freq: Four times a day (QID) | ORAL | Status: DC | PRN
  Administered 2016-09-05 – 2016-09-06 (×3): 25 mg via ORAL
  Filled 2016-09-05 (×3): qty 1

## 2016-09-05 MED ORDER — FAMOTIDINE IN NACL 20-0.9 MG/50ML-% IV SOLN
20.0000 mg | Freq: Two times a day (BID) | INTRAVENOUS | Status: DC
  Administered 2016-09-05 (×2): 20 mg via INTRAVENOUS
  Filled 2016-09-05 (×2): qty 50

## 2016-09-05 MED ORDER — SODIUM CHLORIDE 0.9 % IV SOLN
8.0000 mg | Freq: Three times a day (TID) | INTRAVENOUS | Status: DC
  Administered 2016-09-05 – 2016-09-09 (×10): 8 mg via INTRAVENOUS
  Filled 2016-09-05 (×11): qty 4

## 2016-09-05 MED ORDER — SODIUM CHLORIDE 0.9 % IV SOLN
1.0000 g | Freq: Once | INTRAVENOUS | Status: AC
  Administered 2016-09-05: 1 g via INTRAVENOUS
  Filled 2016-09-05: qty 10

## 2016-09-05 MED ORDER — ONDANSETRON HCL 4 MG PO TABS: 4.0000 mg | ORAL_TABLET | Freq: Three times a day (TID) | ORAL | Status: DC

## 2016-09-05 NOTE — Progress Notes (Signed)
Pharmacy Antibiotic Note  75 yo female presented to ER with LUQ pain and diarrhea. 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 Patient currently not receiving any chemotherapy but is receiving Xgeva for bone mets.  Pharmacyconsulted to dose Vancomycin and cefepime.  Plan:  Change Cefepime 1g IV to q24h for CrCl < 70ms/min  Continue Vancomycin 1g IV q24h  Change enoxaparin to '60mg'$  IV q24h for CrCl <387m/min  Follow up renal fxn, culture results, and clinical course.   Height: '5\' 8"'$  (172.7 cm) Weight: 125 lb 7.1 oz (56.9 kg) IBW/kg (Calculated) : 63.9  Temp (24hrs), Avg:98.1 F (36.7 C), Min:97.8 F (36.6 C), Max:98.7 F (37.1 C)   Recent Labs Lab 09/01/16 1808 09/01/16 2235 09/02/16 0415 09/04/16 0426 09/04/16 0940 09/05/16 0435  WBC 12.6* 11.7* 11.4* 8.3  --   --   CREATININE 1.48* 1.21* 1.06* 1.50*  --  1.84*  VANCOTROUGH  --   --   --   --  15  --     Estimated Creatinine Clearance: 23.4 mL/min (by C-G formula based on SCr of 1.84 mg/dL (H)).    Allergies  Allergen Reactions  . Penicillins Other (See Comments)    Unknown; childhood allergy  . Remeron [Mirtazapine] Other (See Comments)    nightmares  . Latex Rash    Antimicrobials this admission:  9/28 Vanc >>  9/28 Aztreonam >> 9/30 9/30 Cefepime >>  Dose adjustments this admission:  10/1 VT = 15 on vanc '500mg'$  q12h, SCr increasing.  Microbiology results:  9/29 Cdiff: toxin/Ag both neg 9/30 BCx: sent  Thank you for allowing pharmacy to be a part of this patient's care.  ElDolly RiasPh 09/05/2016, 7:48 AM Pager [phone]

## 2016-09-05 NOTE — Care Management Note (Signed)
Case Management Note  Patient Details  Name: AUDRE CENCI MRN: [id number] Date of Birth: 12-22-1940  Subjective/Objective: HCAP. Hx:Met lung ca. flexiseal-diarrhea.ARF. From home. D/c plan SNF-CSW following.                   Action/Plan:d/c SNF   Expected Discharge Date:   (unknown)               Expected Discharge Plan:  [place]  In-House Referral:     Discharge planning Services  CM Consult  Post Acute Care Choice:  Durable Medical Equipment (cane, rw,3n1) Choice offered to:     DME Arranged:    DME Agency:     HH Arranged:    HH Agency:     Status of Service:  In process, will continue to follow  If discussed at Long Length of Stay Meetings, dates discussed:    Additional Comments:  Dessa Phi, RN 09/05/2016, 4:06 PM

## 2016-09-05 NOTE — Progress Notes (Signed)
Initial Nutrition Assessment  DOCUMENTATION CODES:   Severe malnutrition in context of acute illness/injury  INTERVENTION:  Attempted to review "Nausea and Vomiting Nutrition Therapy" handout by the Academy of [employer]. Patient did not want to talk about it at this time - left handout in room with patient's family.  Boost Breeze po TID, each supplement provides 250 kcal and 9 grams of protein.  RD will continue to follow patient closely and monitor discussions regarding Goals of Care. If medically appropriate, recommend placement of NG tube and initiation of tube feeding by day 10 of no PO intake (10/5).    NUTRITION DIAGNOSIS:   Malnutrition (Severe) related to acute illness as evidenced by energy intake < or equal to 50% for > or equal to 5 days, 14 percent weight loss over 2 months.  GOAL:   Patient will meet greater than or equal to 90% of their needs  MONITOR:   PO intake, Supplement acceptance, Labs, I & O's, Skin, Weight trends  REASON FOR ASSESSMENT:   Malnutrition Screening Tool    ASSESSMENT:   75 y.o. 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   Pt is receiving infusions of Denosumab. Last infusion was 08/23/2016.   Attempted to see patient on 9/29, but she did not feel well. Pt once again does not feel well today, and was not able to answer many questions. She reports UBW is 145 lbs. Per chart, pt has lost 9 kg (14% body weight) over approximately 2 months (since 7/24). This is significant for this time frame. She also reports she had poor intake for the past few months, but in the past 7 days she has not had anything to eat.   She reports poor appetite is secondary to severe nausea and early satiety. Also  endorses diarrhea.  Meal Completion: 0-5% per chart. Patient reports the only thing she had yesterday was sips of Sprite.  Medications reviewed and include: pantoprazole, NS + KCl 40 mEq/L @ 125 ml/hr, zofran 4 mg Q6hrs PRN.  Labs reviewed: Chloride 116, CO2 35, Glucose 120.  Unable to complete Nutrition-Focused physical exam as patient refusing at this time.  Discussed plan with RN. When patient tries to eat or drink anything she has emesis.   Diet Order:  Diet Heart Room service appropriate? Yes; Fluid consistency: Thin  Skin:  Wound (see comment) (Stage I and Stage II to right buttocks)  Last BM:  09/05/2016  Height:   Ht Readings from Last 1 Encounters:  09/01/16 '5\' 8"'$  (1.727 m)    Weight:   Wt Readings from Last 1 Encounters:  09/05/16 125 lb 7.1 oz (56.9 kg)    Ideal Body Weight:  63.64 kg  BMI:  Body mass index is 19.07 kg/m.  Estimated Nutritional Needs:   Kcal:  1700-2000  Protein:  70-85 grams  Fluid:  1.7-2 L/day  EDUCATION NEEDS:   Education needs no appropriate at this time (Review Nausea MNT prior to d/c.)  Willey Blade, MS, RD, LDN Pager: [phone] After Hours Pager: [phone]

## 2016-09-05 NOTE — Care Management Note (Deleted)
Case Management Note  Patient Details  Name: Cindy Byrd MRN: [id number] Date of Birth: 08-28-1941  Subjective/Objective: paraplegia. From home. D/c plan SNF-CSW already following..Debride today r ischial infected wound. Iv abx x2.diarrhea-flexiseal,ivf,ARF.                   Action/Plan:d/c SNF   Expected Discharge Date:   (unknown)               Expected Discharge Plan:  [place]  In-House Referral:     Discharge planning Services  CM Consult  Post Acute Care Choice:  Durable Medical Equipment (cane, rw,3n1) Choice offered to:     DME Arranged:    DME Agency:     HH Arranged:    HH Agency:     Status of Service:  In process, will continue to follow  If discussed at Long Length of Stay Meetings, dates discussed:    Additional Comments:  Dessa Phi, RN 09/05/2016, 3:24 PM

## 2016-09-05 NOTE — Progress Notes (Signed)
PROGRESS NOTE    Cindy Byrd  TML:[id number] DOB: 08-22-1941 DOA: 09/01/2016 PCP: Tamsen Roers, MD   Brief Narrative: Cindy Byrd is a 75 y.o. 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    Assessment & Plan:   Principal Problem:   HCAP (healthcare-associated pneumonia) Active Problems:   HTN (hypertension)   Metastatic lung cancer (metastasis from lung to other site) ([facility])   Barrett's esophagus determined by biopsy   Hypokalemia   ARF (acute renal failure) (HCC)   Pressure injury of skin   Cancer (HCC)   Bilateral PE, DVT in the rigth common femoral vein: Currently on lovenox, possibly long term.     Metastatic adenocarcinoma; further management as per Dr Irene Limbo.    Pneumonia: Currently on vancomycin and aztreonam.  Discontinued IV vancomycin for acute renal failure and changed to cefepime.    Blood cultures not done on admission.  Febrile 9/29, repeat blood cultures ordered, pending and negative.   Acute renal failure with metabolic acidosis: Slightly worsended from yesterday, as she did not take any po, was nauseated and vomiting.  Hydrate and monitor renal parameters.  CT abd does not show any hydronephrosis.  Will start her on bicarb drip.   Hypertension: controlled.   Chronic diarrhea worsened after chemo: Watch electrolytes.  c diff negative.  flexi seal ordered.   Nausea, vomiting. Profuse diarrhea and abdominal pain.  Phenergan added on .  CT abd and pelvis without contrast ordered is not significant.  GI consulted and recommendations given.    Hypokalemia: replete and repeat in am.      DVT prophylaxis: LOVENOX.  Code Status: (Full) Family Communication: spoke to son at bedside.  Disposition Plan: possibly to SNF when vomiting resolved.    Consultants:   None.     Procedures: none   Antimicrobials: vancomycin and cefepime.   Subjective: persistent nausea, vomiting and diarrhea.   Objective: Vitals:   09/05/16 0158 09/05/16 0604 09/05/16 0908 09/05/16 1300  BP:  (!) 155/57 (!) 122/52 (!) 127/54  Pulse:  90 79 85  Resp:  16  18  Temp:  97.8 F (36.6 C)  97.8 F (36.6 C)  TempSrc:  Oral  Oral  SpO2:  99% 100% 99%  Weight: 56.9 kg (125 lb 7.1 oz)     Height:        Intake/Output Summary (Last 24 hours) at 09/05/16 1832 Last data filed at 09/05/16 1800  Gross per 24 hour  Intake          4202.08 ml  Output             1951 ml  Net          2251.08 ml   Filed Weights   09/03/16 0418 09/04/16 0500 09/05/16 0158  Weight: 61.6 kg (135 lb 12.9 oz) 60.4 kg (133 lb 2.5 oz) 56.9 kg (125 lb 7.1 oz)    Examination:  General exam: Appears uncomfortable, nauseated.  Respiratory system: Clear to auscultation. Respiratory effort normal. Cardiovascular system: S1 & S2 heard, RRR. No JVD, murmurs, rubs, gallops or clicks. No pedal edema. Gastrointestinal system: Abdomen is nondistended, soft , mild tender. . No organomegaly or masses felt. Normal bowel sounds heard. Central nervous system: Alert and oriented. No focal neurological deficits. Extremities: Symmetric 5 x 5 power. Skin: No rashes, lesions or ulcers     Data  Reviewed: I have personally reviewed following labs and imaging studies  CBC:  Recent Labs Lab 09/01/16 1808 09/01/16 2235 09/02/16 0415 09/04/16 0426  WBC 12.6* 11.7* 11.4* 8.3  NEUTROABS  --   --  8.8*  --   HGB 11.6* 10.9* 10.7* 12.2  HCT 35.2* 33.1* 32.2* 35.8*  MCV 84.2 84.0 84.3 81.2  PLT 173 165 185 824   Basic Metabolic Panel:  Recent Labs Lab 09/01/16 1808 09/01/16 2235 09/02/16 0415 09/04/16 0426 09/04/16 1030 09/05/16 0435  NA 134*  --  137 139  --  137  K 2.5*  --  3.7 2.6*  --  4.0  CL 106  --  113* 113*  --  116*  CO2 18*  --  18* 16*  --  14*  GLUCOSE 88  --  100* 130*  --  120*  BUN  33*  --  28* 22*  --  35*  CREATININE 1.48* 1.21* 1.06* 1.50*  --  1.84*  CALCIUM 7.6*  --  7.0* 6.9*  --  6.6*  MG 1.3* 2.3  --   --  1.7  --    GFR: Estimated Creatinine Clearance: 23.4 mL/min (by C-G formula based on SCr of 1.84 mg/dL (H)). Liver Function Tests:  Recent Labs Lab 09/01/16 1808 09/02/16 0415 09/05/16 0435  AST 81* 64* 67*  ALT 102* 89* 77*  ALKPHOS 63 58 64  BILITOT 0.9 0.5 0.5  PROT 6.0* 5.3* 5.8*  ALBUMIN 2.6* 2.3* 2.1*    Recent Labs Lab 09/01/16 1808 09/05/16 0435  LIPASE 26 22   No results for input(s): AMMONIA in the last 168 hours. Coagulation Profile:  Recent Labs Lab 09/02/16 1030  INR 1.44   Cardiac Enzymes:  Recent Labs Lab 09/01/16 2235 09/02/16 0415 09/02/16 1030  TROPONINI <0.03 <0.03 <0.03   BNP (last 3 results) No results for input(s): PROBNP in the last 8760 hours. HbA1C: No results for input(s): HGBA1C in the last 72 hours. CBG: No results for input(s): GLUCAP in the last 168 hours. Lipid Profile: No results for input(s): CHOL, HDL, LDLCALC, TRIG, CHOLHDL, LDLDIRECT in the last 72 hours. Thyroid Function Tests: No results for input(s): TSH, T4TOTAL, FREET4, T3FREE, THYROIDAB in the last 72 hours. Anemia Panel: No results for input(s): VITAMINB12, FOLATE, FERRITIN, TIBC, IRON, RETICCTPCT in the last 72 hours. Sepsis Labs: No results for input(s): PROCALCITON, LATICACIDVEN in the last 168 hours.  Recent Results (from the past 240 hour(s))  C difficile quick scan w PCR reflex     Status: None   Collection Time: 09/02/16  7:15 PM  Result Value Ref Range Status   C Diff antigen NEGATIVE NEGATIVE Final   C Diff toxin NEGATIVE NEGATIVE Final   C Diff interpretation No C. difficile detected.  Final  Culture, blood (Routine X 2) w Reflex to ID Panel     Status: None (Preliminary result)   Collection Time: 09/03/16  6:05 PM  Result Value Ref Range Status   Specimen Description BLOOD RIGHT ARM  Final   Special Requests  BOTTLES DRAWN AEROBIC AND ANAEROBIC 5CC  Final   Culture   Final    NO GROWTH 1 DAY Performed at [facility]    Report Status PENDING  Incomplete  Culture, blood (Routine X 2) w Reflex to ID Panel     Status: None (Preliminary result)   Collection Time: 09/03/16  6:11 PM  Result Value Ref Range Status   Specimen Description BLOOD RIGHT ARM  Final   Special Requests BOTTLES DRAWN AEROBIC ONLY 5CC  Final   Culture   Final    NO GROWTH 1 DAY Performed at [facility]    Report Status PENDING  Incomplete         Radiology Studies: Ct Abdomen Pelvis Wo Contrast  Result Date: 09/04/2016 CLINICAL DATA:  Metastatic non-small-cell lung cancer under observation. History of Barrett's esophagus, anemia and colitis. History of esophageal adenocarcinoma. EXAM: CT ABDOMEN AND PELVIS WITHOUT CONTRAST TECHNIQUE: Multidetector CT imaging of the abdomen and pelvis was performed following the standard protocol without IV contrast. COMPARISON:  CT 06/24/2016 and 04/19/2016. FINDINGS: Lower chest: New patchy dependent airspace opacities in both lower lobes. There is no significant pleural or pericardial effusion. Hepatobiliary: There is decreased hepatic density consistent with steatosis. Multiple low-density hepatic lesions are grossly stable, likely cysts. No new or enlarging lesions are identified. The gallbladder is distended without wall thickening. No evidence of gallstones, surrounding inflammation or biliary dilatation. Pancreas: Unremarkable. No pancreatic ductal dilatation or surrounding inflammatory changes. Spleen: Normal in size without focal abnormality. Adrenals/Urinary Tract: Both adrenal glands appear normal. Both kidneys appear normal. There is no evidence of renal mass, hydronephrosis or urinary tract calculus. Left ovarian vein phleboliths are grossly stable. There is high-density material within the urinary bladder. No focal urothelial lesions are seen. Stomach/Bowel: The  stomach is decompressed. There is fluid throughout the small bowel and colon. No bowel wall thickening or surrounding inflammatory change identified. The appendix appears normal. There are mild diverticular changes of the sigmoid colon. Rectal tube in place. Vascular/Lymphatic: There are no enlarged abdominal or pelvic lymph nodes. Aortic and branch vessel atherosclerosis. Reproductive: Hysterectomy.  No evidence of adnexal mass. Other: No ascites, peritoneal nodularity or free air. There is soft tissue stranding and subcutaneous air within the subcutaneous fat of the left anterior abdominal wall, presumably from subcutaneous injections. Musculoskeletal: Grossly stable destructive mass involving the left iliac bone with associated pathologic fracture. No new osseous lesions or new fractures are seen. IMPRESSION: 1. Grossly stable metastasis involving the left iliac bone with associated pathologic fracture. 2. No other evidence of metastatic disease within the abdomen or pelvis. 3. New patchy airspace opacities at both lung bases may reflect atelectasis or aspiration. Correlate clinically. 4. Fluid-filled small and large bowel without significant distention or wall thickening. Mild sigmoid diverticulosis. Electronically Signed   By: Richardean Sale M.D.   On: 09/04/2016 17:34        Scheduled Meds: . amLODipine  5 mg Oral Daily  . ceFEPime (MAXIPIME) IV  1 g Intravenous Q24H  . citalopram  20 mg Oral Daily  . enoxaparin (LOVENOX) injection  60 mg Subcutaneous Q24H  . famotidine (PEPCID) IV  20 mg Intravenous Q12H  . feeding supplement  1 Container Oral TID BM  . lactobacillus acidophilus & bulgar  1 tablet Oral TID WC  . mesalamine  2.4 g Oral Q breakfast  . ondansetron (ZOFRAN) IV  8 mg Intravenous Q8H   Continuous Infusions: . 0.9 % NaCl with KCl 40 mEq / L 125 mL/hr (09/05/16 1206)     LOS: 3 days    Time spent: 30 minutes.     Hosie Poisson, MD [facility] Pager  [phone]  If 7PM-7AM, please contact night-coverage [email or web address] Password [facility] 09/05/2016, 6:32 PM

## 2016-09-05 NOTE — Progress Notes (Signed)
PT Cancellation Note  Patient Details Name: SHAMYIA GRANDPRE MRN: [id number] DOB: Jul 19, 1941   Cancelled Treatment:    Reason Eval/Treat Not Completed: Patient declined, no reason specified. Will check back another day.    Weston Anna, MPT Pager: [phone]

## 2016-09-05 NOTE — Consult Note (Signed)
Consultation  Referring Provider:  [facility] hospitalist/ Dr Karleen Hampshire Primary Care Physician:  Tamsen Roers, MD Primary Gastroenterologist:  Dr.Pyrtle  Reason for Consultation:   Nausea/Vomiting/ Diarrhea  HPI: Cindy Byrd is a 75 y.o. female with metastatic non-small cell lung cancer diagnosed in 2016. She has a large metastatic lesion to the left ilium. She had been on treatment with Nivolumab which was discontinued in July 2017 after she developed an immune related colitis. She did undergo colonoscopy with Dr. [name] on 06/15/2016 for persistent diarrhea and was found to have a mild diffuse colitis worse in the sigmoid colon and rectum. Biopsies were consistent with an active colitis and this was felt secondary to Nivolumab . She has been treated with Lialda 2.4 g daily. Patient also has Barrett's esophagus and last underwent EGD in March 2017. She had biopsies of an esophageal nodule which did show high-grade dysplasia arising from Barrett's mucosa. She was referred to [facility]/Dr. Adria Devon for consideration of EMR and ablation. She was seen in consultation in April 2017 but has not had repeat EGD as yet. Patient was admitted on 09/01/2016 with complaints of chest pain 2 days. She was felt to possibly have a community-acquired pneumonia however Ct angio on 09/02/2016 showed acute bilateral pulmonary emboli positive for acute PE with CT evidence of right heart strain system with at least submassive PE, there is also right basilar airspace disease possibly reflecting atelectasis versus pneumonia versus pulmonary infarction. She has been started on Lovenox. Over the past 2 days she has had an increase in diarrhea to the point that she has required a rectal tube and also has had nausea and vomiting with inability to keep down by mouth's.   She is being covered with IV cefepime, and vancomycin IV. C. difficile quick screen was negative. CT of the abdomen and pelvis done yesterday shows a distended  gallbladder without gallbladder wall thickening or gallstones no evidence of metastatic disease in the abdomen but this was done noncontrasted. Also noted new bilateral patchy airspace opacities. Labs have been reviewed. Patient is nauseated currently and doesn't offer a lot of detailed history. She says she doesn't feel like doing this today and wonders if we can talk on another day. Her sister who is at bedside offer some history. They both state that she has had problems with diarrhea for 5-6 years which became worse and with Nivolumab . She has been having significant diarrhea over the past 3-4 months , Oncology note relates she never took the [place].They are unable to be specific about the number of bowel movements per day but it sounds as if she is having at least 5-6 bowel movements per day with frequent episodes of incontinence. She started having nausea and vomiting about 2-1/2 weeks ago which has been persistent since. She complains of abdominal tenderness "all over". She had not been on any new medications, nor had any antibiotics until she was admitted to the hospital.     Past Medical History:  Diagnosis Date  . Barrett's esophagus   . Bone neoplasm 06/24/2015  . Cancer [facility])    metastatic poorly differentiated carcinoma. tumor left groin surgical removal with radiation tx.  . Cataract    BILATERAL  . Cigarette smoker two packs a day or less    Currently still smoking 2 PPD - Not interested in quitting at this time.  . Colitis 2017  . Colon polyps    hyperplastic, tubular adenomas, tubulovillous adenoma  . Cough, persistent  hx. lung cancer ? primary-being evaluated, unsure of primary site.  . Depression 06/24/2015  . Diverticulosis   . Emphysema of lung ([place])   . Endometriosis    Hysterectomy with BSO at age 59 yrs  . Esophageal adenocarcinoma ([place]) 08/11/15   intramucosal  . Gastritis   . GERD (gastroesophageal reflux disease)   . H/O: pneumonia   . Heavy smoker (more  than 20 cigarettes per day) 06/24/2015  . Hiatal hernia   . Hyperlipidemia   . Hypertension 06/24/2015   likely improved incidental to 40 lbs weight loss from her neoplasm. No Longer taking med for this as of 08-06-15  . IBS (irritable bowel syndrome)   . Pain    left hip-persistent"tumor of bone"-radiation tx. 10.  . Pulmonary nodules   . Swelling of ankle    bilateral  . Vitamin D deficiency disease     Past Surgical History:  Procedure Laterality Date  . ABDOMINAL HYSTERECTOMY    . BARTHOLIN GLAND CYST EXCISION  75 yo ago   Does not want if it was an infected cyst or tumor. Was soon as delivery  . COLONOSCOPY W/ POLYPECTOMY     multiple times - last done 09/2014 per patient.  . ESOPHAGOGASTRODUODENOSCOPY (EGD) WITH PROPOFOL N/A 08/11/2015   Procedure: ESOPHAGOGASTRODUODENOSCOPY (EGD) WITH PROPOFOL;  Surgeon: [name], MD;  Location: [facility];  Service: Gastroenterology;  Laterality: N/A;  . GANGLION CYST EXCISION    . KNEE ARTHROSCOPY  age about 67 yrs  . TONSILLECTOMY    . TOTAL ABDOMINAL HYSTERECTOMY W/ BILATERAL SALPINGOOPHORECTOMY  at age 3 yrs   For endometriosis    Prior to Admission medications   Medication Sig Start Date End Date Taking? Authorizing Provider  amLODipine (NORVASC) 5 MG tablet TAKE 1 TABLET (5 MG TOTAL) BY MOUTH DAILY. 02/06/16  Yes [name], MD  citalopram (CELEXA) 20 MG tablet Take 1 tablet (20 mg total) by mouth daily. 06/27/16  Yes [name], MD  clotrimazole-betamethasone (LOTRISONE) cream Apply 1 application topically 2 (two) times daily. Apply to perianal skin twice daily. 06/15/16  Yes [name], MD  diphenoxylate-atropine (LOMOTIL) 2.5-0.025 MG tablet Take 1-2 tablets by mouth 4 (four) times daily as needed for diarrhea or loose stools. 08/23/16  Yes [name], MD  furosemide (LASIX) 20 MG tablet Take 20 mg by mouth 2 (two) times daily.   Yes Historical Provider, MD  lactobacillus acidophilus & bulgar (LACTINEX)  chewable tablet Chew 1 tablet by mouth 3 (three) times daily with meals. 05/26/16  Yes [name], MD  lidocaine-prilocaine (EMLA) cream Apply small amount over port 1-2 hours prior to treatment, cover with plastic wrap (DO NOT RUB IN). 08/27/15  Yes Gautam Juleen China, MD  LORazepam (ATIVAN) 1 MG tablet Take 1 tablet (1 mg total) by mouth every 8 (eight) hours as needed for anxiety (or nausea). 08/19/16  Yes [name], MD  mesalamine (LIALDA) 1.2 g EC tablet Take 2 tablets (2.4 g total) by mouth daily with breakfast. 06/15/16  Yes [name], MD  potassium chloride (MICRO-K) 10 MEQ CR capsule Take 10 mEq by mouth daily.  08/17/16  Yes Historical Provider, MD  PRESCRIPTION MEDICATION Antibody Plan Marianne   Yes Historical Provider, MD  zolpidem (AMBIEN CR) 12.5 MG CR tablet Take 1 tablet (12.5 mg total) by mouth at bedtime as needed for sleep. 08/05/16  Yes Gautam Juleen China, MD  NICOTINE STEP 1 21 MG/24HR patch PLACE 1 PATCH (  21 MG TOTAL) ONTO THE SKIN DAILY. Patient not taking: Reported on 09/01/2016 03/05/16   [name], MD  omeprazole (PRILOSEC) 40 MG capsule TAKE ONE CAPSULE BY MOUTH EVERY DAY *NEEDS OFFICE VISIT FOR FURTHER REFILL* 07/06/16   [name], MD  ondansetron (ZOFRAN) 8 MG tablet Take 1 tablet (8 mg total) by mouth every 8 (eight) hours as needed for nausea or vomiting. 07/15/16   Susanne Borders, NP  oxyCODONE-acetaminophen (PERCOCET/ROXICET) 5-325 MG tablet Take 1 tablet by mouth every 4 (four) hours as needed for severe pain. 07/26/16   [name], MD  polycarbophil (FIBERCON) 625 MG tablet Take 2 tablets (1,250 mg total) by mouth 2 (two) times daily. Patient not taking: Reported on 09/01/2016 01/19/16   [name], MD  potassium chloride SA (K-DUR,KLOR-CON) 20 MEQ tablet Take 2 tab(39mq) po twice daily for 3 days then 2tabs once daily for 7 days then 1 tab po daily Patient not taking: Reported on 09/01/2016 08/23/16   GBrunetta Genera MD    potassium chloride SA (K-DUR,KLOR-CON) 20 MEQ tablet Take 1 tablet (20 mEq total) by mouth daily. While on lasix Patient not taking: Reported on 09/01/2016 08/23/16   GBrunetta Genera MD  predniSONE (DELTASONE) 20 MG tablet Take 1 tablet (20 mg total) by mouth daily with breakfast. Patient not taking: Reported on 09/01/2016 08/11/16   GBrunetta Genera MD  valsartan (DIOVAN) 80 MG tablet Take 80 mg by mouth daily.  02/03/16   Historical Provider, MD    Current Facility-Administered Medications  Medication Dose Route Frequency Provider Last Rate Last Dose  . 0.9 % NaCl with KCl 40 mEq / L  infusion   Intravenous Continuous VHosie Poisson MD 125 mL/hr at 09/05/16 1206 125 mL/hr at 09/05/16 1206  . acetaminophen (TYLENOL) tablet 650 mg  650 mg Oral Q6H PRN ARise Patience MD   650 mg at 09/03/16 1411   Or  . acetaminophen (TYLENOL) suppository 650 mg  650 mg Rectal Q6H PRN ARise Patience MD      . amLODipine (NORVASC) tablet 5 mg  5 mg Oral Daily ARise Patience MD   5 mg at 09/05/16 0909  . calcium gluconate 1 g in sodium chloride 0.9 % 100 mL IVPB  1 g Intravenous Once VHosie Poisson MD      . ceFEPIme (MAXIPIME) 1 g in dextrose 5 % 50 mL IVPB  1 g Intravenous Q24H VHosie Poisson MD      . citalopram (CELEXA) tablet 20 mg  20 mg Oral Daily ARise Patience MD   20 mg at 09/05/16 0908  . diphenoxylate-atropine (LOMOTIL) 2.5-0.025 MG per tablet 1-2 tablet  1-2 tablet Oral QID PRN ARise Patience MD   1 tablet at 09/04/16 2039  . enoxaparin (LOVENOX) injection 60 mg  60 mg Subcutaneous Q24H VHosie Poisson MD      . feeding supplement (BOOST / RESOURCE BREEZE) liquid 1 Container  1 Container Oral TID BM VHosie Poisson MD      . hydrALAZINE (APRESOLINE) injection 10 mg  10 mg Intravenous Q4H PRN ARise Patience MD      . lactobacillus acidophilus & bulgar (LACTINEX) chewable tablet 1 tablet  1 tablet Oral TID WC ARise Patience MD   1 tablet at 09/05/16 1206  .  LORazepam (ATIVAN) tablet 1 mg  1 mg Oral Q8H PRN ARise Patience MD   1 mg at 09/03/16 2118  . mesalamine (LIALDA)  EC tablet 2.4 g  2.4 g Oral Q breakfast Rise Patience, MD   2.4 g at 09/05/16 0843  . ondansetron (ZOFRAN) tablet 4 mg  4 mg Oral Q6H PRN Rise Patience, MD       Or  . ondansetron [facility]) injection 4 mg  4 mg Intravenous Q6H PRN Rise Patience, MD   4 mg at 09/05/16 1206  . oxyCODONE-acetaminophen (PERCOCET/ROXICET) 5-325 MG per tablet 1 tablet  1 tablet Oral Q4H PRN Rise Patience, MD   1 tablet at 09/05/16 1453  . pantoprazole (PROTONIX) EC tablet 40 mg  40 mg Oral Daily Rise Patience, MD   40 mg at 09/05/16 0908  . promethazine (PHENERGAN) tablet 25 mg  25 mg Oral Q6H PRN Hosie Poisson, MD   25 mg at 09/05/16 1453  . sodium chloride flush (NS) 0.9 % injection 10-40 mL  10-40 mL Intracatheter PRN Rise Patience, MD   10 mL at 09/05/16 0435  . zolpidem (AMBIEN) tablet 5 mg  5 mg Oral QHS PRN Rise Patience, MD   5 mg at 09/02/16 2207    Allergies as of 09/01/2016 - Review Complete 09/01/2016  Allergen Reaction Noted  . Penicillins Other (See Comments) 07/13/2015  . Remeron [mirtazapine] Other (See Comments) 12/21/2015  . Latex Rash 08/07/2015    Family History  Problem Relation Age of Onset  . Colon cancer Brother   . Colon cancer Brother   . Stroke Mother   . Colon cancer Father   . Breast cancer Daughter 85    ER/PR+ stage II    Social History   Social History  . Marital status: Widowed    Spouse name: N/A  . Number of children: 2  . Years of education: N/A   Occupational History  . Not on file.   Social History Main Topics  . Smoking status: Former Smoker    Packs/day: 1.00    Years: 60.00    Types: Cigarettes    Quit date: 12/06/2015  . Smokeless tobacco: Never Used  . Alcohol use No  . Drug use: No  . Sexual activity: No   Other Topics Concern  . Not on file   Social History Narrative  . No narrative  on file    Review of Systems: Pertinent positive and negative review of systems were noted in the above HPI section.  All other review of systems was otherwise negative.  Physical Exam: Vital signs in last 24 hours: Temp:  [97.8 F (36.6 C)] 97.8 F (36.6 C) (10/02 1300) Pulse Rate:  [79-91] 85 (10/02 1300) Resp:  [16-20] 18 (10/02 1300) BP: (117-155)/(52-69) 127/54 (10/02 1300) SpO2:  [99 %-100 %] 99 % (10/02 1300) Weight:  [125 lb 7.1 oz (56.9 kg)] 125 lb 7.1 oz (56.9 kg) (10/02 0158) Last BM Date: 09/05/16 General:   Alert,  Acute and chronically ill-appearing in no acute distress, holding her abdomen  Head:  Normocephalic and atraumatic. Eyes:  Sclera clear, no icterus.   Conjunctiva pink. Ears:  Normal auditory acuity. Nose:  No deformity, discharge,  or lesions. Mouth:  No deformity or lesions.   Neck:  Supple; no masses or thyromegaly. Lungs:  Clear throughout to auscultation.   No wheezes, crackles, or rhonchi. Heart:  Regular rate and rhythm; no murmurs, clicks, rubs,  or gallops. Abdomen:  Soft, rather diffusely tender nonfocal no guarding or rebound, BS active,nonpalp mass or hsm.   Rectal:  Deferred -rectal tube out Msk:  Symmetrical without gross deformities. . Pulses:  Normal pulses noted. Extremities:  Without clubbing or edema. Neurologic:  Alert and  oriented x4;  grossly normal neurologically. Skin:  Intact without significant lesions or rashes.. Psych:  Alert and cooperative. Normal mood and affect.  Intake/Output from previous day: 10/01 0701 - 10/02 0700 In: 3971.7 [P.O.:1320; I.V.:2101.7; IV Piggyback:550] Out: 2651 [Urine:350; Emesis/NG output:1; ARWPT:0034] Intake/Output this shift: Total I/O In: 30 [P.O.:30] Out: -   Lab Results:  Recent Labs  09/04/16 0426  WBC 8.3  HGB 12.2  HCT 35.8*  PLT 293   BMET  Recent Labs  09/04/16 0426 09/05/16 0435  NA 139 137  K 2.6* 4.0  CL 113* 116*  CO2 16* 14*  GLUCOSE 130* 120*  BUN 22* 35*    CREATININE 1.50* 1.84*  CALCIUM 6.9* 6.6*   LFT  Recent Labs  09/05/16 0435  PROT 5.8*  ALBUMIN 2.1*  AST 67*  ALT 77*  ALKPHOS 64  BILITOT 0.5  BILIDIR <0.1*  IBILI NOT CALCULATED   PT/INR No results for input(s): LABPROT, INR in the last 72 hours. Hepatitis Panel No results for input(s): HEPBSAG, HCVAB, HEPAIGM, HEPBIGM in the last 72 hours.   IMPRESSION:  #2 75 year old female with metastatic non-small cell lung cancer with known large metastases to the left ilium status post radiation. Patient had been on treatment with Nivolumab which was discontinued in July 2017 after diagnosis made of grade 2 immune colitis. Patient has had persistent diarrhea probably somewhat progressive  #2 acute nausea and vomiting 2-1/2 weeks-etiology not clear #3 new large I lateral pulmonary emboli with evidence of right heart strain-on Lovenox #4 Barrett's esophagus with high-grade dysplasia documented EGD March 2017-patient referred to [facility] but has not had follow-up EGD as yet #5 probable HCAP-on cefepime and vancomycin   Plan; check GI pathogen panel Have changed IV Zofran to 8 mg every 8 hours around-the-clock Full liquid diet DC oral Protonix and switch to Pepcid 20 mg IV every 12 hours  Increase  Lialda  to 4.8 gm daily If GI pathogen panel is negative, she may need for at least repeat flex to assess severity of colitis, and help guide escalation of therapy Also would consider EGD at that time.       Merle Cirelli  09/05/2016, 3:23 PM

## 2016-09-05 NOTE — Progress Notes (Addendum)
Rectal tube has come out x2 this shift.   Amount of diarrhea has decreased this shift.  Will keep rectal tube out at this time and continue to use barrier cream liberally.   Is able to tolerate small amounts of ice but continues to be unable to tolerate much more than that without emesis.

## 2016-09-06 ENCOUNTER — Ambulatory Visit: Payer: Medicare Other

## 2016-09-06 ENCOUNTER — Other Ambulatory Visit: Payer: Medicare Other

## 2016-09-06 ENCOUNTER — Telehealth: Payer: Self-pay | Admitting: *Deleted

## 2016-09-06 DIAGNOSIS — K2270 Barrett's esophagus without dysplasia: Secondary | ICD-10-CM

## 2016-09-06 DIAGNOSIS — C3490 Malignant neoplasm of unspecified part of unspecified bronchus or lung: Secondary | ICD-10-CM

## 2016-09-06 DIAGNOSIS — J189 Pneumonia, unspecified organism: Secondary | ICD-10-CM

## 2016-09-06 LAB — GASTROINTESTINAL PANEL BY PCR, STOOL (REPLACES STOOL CULTURE)
ASTROVIRUS: NOT DETECTED
Adenovirus F40/41: NOT DETECTED
CYCLOSPORA CAYETANENSIS: NOT DETECTED
Campylobacter species: NOT DETECTED
Cryptosporidium: NOT DETECTED
ENTAMOEBA HISTOLYTICA: NOT DETECTED
ENTEROAGGREGATIVE E COLI (EAEC): NOT DETECTED
ENTEROTOXIGENIC E COLI (ETEC): NOT DETECTED
Enteropathogenic E coli (EPEC): NOT DETECTED
GIARDIA LAMBLIA: NOT DETECTED
NOROVIRUS GI/GII: NOT DETECTED
Plesimonas shigelloides: NOT DETECTED
Rotavirus A: NOT DETECTED
SAPOVIRUS (I, II, IV, AND V): NOT DETECTED
Salmonella species: NOT DETECTED
Shiga like toxin producing E coli (STEC): NOT DETECTED
Shigella/Enteroinvasive E coli (EIEC): NOT DETECTED
VIBRIO CHOLERAE: NOT DETECTED
VIBRIO SPECIES: NOT DETECTED
Yersinia enterocolitica: NOT DETECTED

## 2016-09-06 LAB — URINALYSIS, ROUTINE W REFLEX MICROSCOPIC
Bilirubin Urine: NEGATIVE
GLUCOSE, UA: NEGATIVE mg/dL
HGB URINE DIPSTICK: NEGATIVE
Ketones, ur: NEGATIVE mg/dL
LEUKOCYTES UA: NEGATIVE
Nitrite: NEGATIVE
PH: 5.5 (ref 5.0–8.0)
Protein, ur: 100 mg/dL — AB
Specific Gravity, Urine: 1.024 (ref 1.005–1.030)

## 2016-09-06 LAB — URINE MICROSCOPIC-ADD ON: RBC / HPF: NONE SEEN RBC/hpf (ref 0–5)

## 2016-09-06 LAB — BASIC METABOLIC PANEL
Anion gap: 4 — ABNORMAL LOW (ref 5–15)
BUN: 40 mg/dL — AB (ref 6–20)
CHLORIDE: 122 mmol/L — AB (ref 101–111)
CO2: 11 mmol/L — ABNORMAL LOW (ref 22–32)
CREATININE: 2 mg/dL — AB (ref 0.44–1.00)
Calcium: 5.9 mg/dL — CL (ref 8.9–10.3)
GFR calc Af Amer: 27 mL/min — ABNORMAL LOW (ref >60)
GFR, EST NON AFRICAN AMERICAN: 23 mL/min — AB (ref >60)
GLUCOSE: 86 mg/dL (ref 65–99)
POTASSIUM: 4.7 mmol/L (ref 3.5–5.1)
SODIUM: 137 mmol/L (ref 135–145)

## 2016-09-06 LAB — SEDIMENTATION RATE: SED RATE: 23 mm/h — AB (ref 0–22)

## 2016-09-06 MED ORDER — FAMOTIDINE IN NACL 20-0.9 MG/50ML-% IV SOLN
20.0000 mg | INTRAVENOUS | Status: DC
  Administered 2016-09-06 – 2016-09-08 (×3): 20 mg via INTRAVENOUS
  Filled 2016-09-06 (×3): qty 50

## 2016-09-06 MED ORDER — ORAL CARE MOUTH RINSE
15.0000 mL | Freq: Two times a day (BID) | OROMUCOSAL | Status: DC
  Administered 2016-09-07 – 2016-09-12 (×7): 15 mL via OROMUCOSAL

## 2016-09-06 MED ORDER — MESALAMINE 1.2 G PO TBEC
4.8000 g | DELAYED_RELEASE_TABLET | Freq: Every day | ORAL | Status: DC
  Administered 2016-09-07 – 2016-09-13 (×7): 4.8 g via ORAL
  Filled 2016-09-06 (×8): qty 4

## 2016-09-06 MED ORDER — SODIUM CHLORIDE 0.9 % IV BOLUS (SEPSIS)
2000.0000 mL | Freq: Once | INTRAVENOUS | Status: AC
  Administered 2016-09-06: 2000 mL via INTRAVENOUS

## 2016-09-06 MED ORDER — CHLORHEXIDINE GLUCONATE 0.12 % MT SOLN
15.0000 mL | Freq: Two times a day (BID) | OROMUCOSAL | Status: DC
  Administered 2016-09-07 – 2016-09-13 (×10): 15 mL via OROMUCOSAL
  Filled 2016-09-06 (×10): qty 15

## 2016-09-06 MED ORDER — VITAMINS A & D EX OINT
TOPICAL_OINTMENT | CUTANEOUS | Status: AC
  Administered 2016-09-06: 1
  Filled 2016-09-06: qty 5

## 2016-09-06 MED ORDER — STERILE WATER FOR INJECTION IV SOLN
INTRAVENOUS | Status: DC
  Administered 2016-09-06: 10:00:00 via INTRAVENOUS
  Filled 2016-09-06: qty 9.71

## 2016-09-06 MED ORDER — STERILE WATER FOR INJECTION IV SOLN: INTRAVENOUS | Status: DC

## 2016-09-06 MED ORDER — SODIUM CHLORIDE 4 MEQ/ML IV SOLN
INTRAVENOUS | Status: DC
  Administered 2016-09-06 – 2016-09-12 (×18): via INTRAVENOUS
  Filled 2016-09-06 (×26): qty 9.71

## 2016-09-06 MED ORDER — SODIUM CHLORIDE 0.9 % IV SOLN
2.0000 g | Freq: Once | INTRAVENOUS | Status: AC
  Administered 2016-09-06: 2 g via INTRAVENOUS
  Filled 2016-09-06: qty 20

## 2016-09-06 NOTE — Progress Notes (Signed)
Nephrology wrote orders to measure and replace pt's urine and stool output every hour (plus 75cc). This nsg unit unable to keep a pt requiring hourly intervention over 4 consecutive hours. Therefore, orders placed to transfer pt to SDU.  KJKG, NP [facility]

## 2016-09-06 NOTE — Progress Notes (Signed)
Spoke to MD Deterding about clarification on fluid replacement orders. He stated to calculate losses from previous hour add 75cc, and replace with fluids per hour, rate changing hourly based on losses each hour.  Rosine Beat, RN

## 2016-09-06 NOTE — Progress Notes (Signed)
Physical Therapy Treatment Patient Details Name: SHUNDRA WIRSING MRN: [id number] DOB: 12-25-1940 Today's Date: 09/06/2016    History of Present Illness LILLYBETH TAL is a 75 y.o. female with metastatic non-small cell lung cancer under observation, hypertension, Barrett's esophagus, anemia and colitis  with persistent diarrhea presents to the ER 09/01/16 because of chest pain. Patient has been having pleuritic type of chest pain  CT angio reveals  acute bilateral PE and R leg DVT on 09/02/16.    PT Comments    Progressing slowly with mobility. Pt appeared mildly confused and disoriented. Increased time to complete all tasks this session. Pt c/o pain and dizziness-see vitals section. Continue to recommend SNF.  Follow Up Recommendations  SNF     Equipment Recommendations  None recommended by PT    Recommendations for Other Services OT consult     Precautions / Restrictions Precautions Precautions: Fall Precaution Comments: diarrhea, Cdiff Restrictions Weight Bearing Restrictions: No    Mobility  Bed Mobility Overal bed mobility: Needs Assistance Bed Mobility: Supine to Sit;Sit to Supine     Supine to sit: Min guard Sit to supine: Min guard   General bed mobility comments: Increased time. close guard for safety. Pt c/o dizziness.   Transfers Overall transfer level: Needs assistance Equipment used: 1 person hand held assist Transfers: Sit to/from Stand Sit to Stand: Min assist         General transfer comment: Assist to rise, stabilize, control descent. VCs safety.   Ambulation/Gait Ambulation/Gait assistance: Min assist   Assistive device: 1 person hand held assist       General Gait Details: side steps along side of bed. Unsteady.   Stairs            Wheelchair Mobility    Modified Rankin (Stroke Patients Only)       Balance Overall balance assessment: Needs assistance Sitting-balance support: Feet supported;Bilateral upper extremity  supported Sitting balance-Leahy Scale: Good       Standing balance-Leahy Scale: Poor                      Cognition Arousal/Alertness: Awake/alert (drowsy) Behavior During Therapy: WFL for tasks assessed/performed Overall Cognitive Status:  (pt appeared somewhat confused. )                      Exercises      General Comments        Pertinent Vitals/Pain Pain Assessment: Faces Faces Pain Scale: Hurts whole lot Pain Location: L side abdomen Pain Descriptors / Indicators: Grimacing;Guarding Pain Intervention(s): Limited activity within patient's tolerance;Repositioned    Home Living                      Prior Function            PT Goals (current goals can now be found in the care plan section) Progress towards PT goals: Progressing toward goals (slowly)    Frequency    Min 3X/week      PT Plan Current plan remains appropriate    Co-evaluation             End of Session   Activity Tolerance: Patient limited by fatigue;Patient limited by pain Patient left: in bed;with call bell/phone within reach;with bed alarm set     Time: 6712-4580 PT Time Calculation (min) (ACUTE ONLY): 15 min  Charges:  $Therapeutic Activity: 8-22 mins  G Codes:      Weston Anna, MPT Pager: [phone]

## 2016-09-06 NOTE — Care Management Note (Signed)
Case Management Note  Patient Details  Name: Cindy Byrd MRN: [id number] Date of Birth: 1941/04/26  Subjective/Objective:  Noted palliative cons-await recc.  CSW following for SNF.                Action/Plan:d/c plan SNF.   Expected Discharge Date:   (unknown)               Expected Discharge Plan:  [place]  In-House Referral:     Discharge planning Services  CM Consult  Post Acute Care Choice:  Durable Medical Equipment (cane, rw,3n1) Choice offered to:     DME Arranged:    DME Agency:     HH Arranged:    HH Agency:     Status of Service:  In process, will continue to follow  If discussed at Long Length of Stay Meetings, dates discussed:    Additional Comments:  Dessa Phi, RN 09/06/2016, 12:03 PM

## 2016-09-06 NOTE — Consult Note (Addendum)
Renal Service Consult Note [place] 09/06/2016 [place] Requesting Physician:  Dr Karleen Hampshire  Reason for Consult:  Acute renal insuff HPI: The patient is a 75 y.o. year-old with hx of tobacco use, COPD, depression was dx'd with cancer around Oct 2016, poor diff adenoCa felt likely to be lung primary.  She got chemoRx until developing severe diarrhea this summer and is now off chemoRx.  Admitted on 9/28 with pleuritic L CP and AKI/ vol depletion from diarrhea.  She had CT angio chest w contrast on 9/29 which showed acute bilat PE with R heart strain, also CT showed some bibasilar infiltrates which could be edema vs atx or infarction/ PNA.  She rec'd IV vanc for 3.5 days, and IV azactam.  Creat started to rise and Vanc was dc'd 2 days ago.  Creat was 1.0 on admission and 1.5 two days later on 9/30, now up to 2.00.   Patient very tired, main issue is diarrhea.  Dry mouth, tender stomach.  No fevers or chills.  No hx kidney problems   Pt followed by Oncology for metastatic poorly diff carcinoma w likely lung primary (NSCCa) with mediastinal LAN, L iliac bone disease, lung nodules.  She had XRT to ilial lesion and chemoRx starting in late 2016.   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 Barrett's esophagus   . Bone neoplasm 06/24/2015  . Cancer [facility])    metastatic poorly differentiated carcinoma. tumor left groin surgical removal with radiation tx.  . Cataract    BILATERAL  . Cigarette smoker two packs a day or less    Currently still smoking 2 PPD - Not interested in quitting at this time.  . Colitis 2017  . Colon polyps     hyperplastic, tubular adenomas, tubulovillous adenoma  . Cough, persistent    hx. lung cancer ? primary-being evaluated, unsure of primary site.  . Depression 06/24/2015  . Diverticulosis   . Emphysema of lung ([place])   . Endometriosis    Hysterectomy with BSO at age 35 yrs  . Esophageal adenocarcinoma ([place]) 08/11/15   intramucosal  . Gastritis   . GERD (gastroesophageal reflux disease)   . H/O: pneumonia   . Heavy smoker (more than 20 cigarettes per day) 06/24/2015  . Hiatal hernia   . Hyperlipidemia   . Hypertension 06/24/2015   likely improved incidental to 40 lbs weight loss from her neoplasm. No Longer taking med for this as of 08-06-15  . IBS (irritable bowel syndrome)   . Pain    left hip-persistent"tumor of bone"-radiation tx. 10.  . Pulmonary nodules   . Swelling of ankle    bilateral  . Vitamin D deficiency disease    Past Surgical History  Past Surgical History:  Procedure Laterality Date  . ABDOMINAL HYSTERECTOMY    . BARTHOLIN GLAND CYST EXCISION  75 yo ago   Does not want if it was an infected cyst or tumor. Was soon as delivery  . COLONOSCOPY W/ POLYPECTOMY     multiple times - last done 09/2014 per  patient.  . ESOPHAGOGASTRODUODENOSCOPY (EGD) WITH PROPOFOL N/A 08/11/2015   Procedure: ESOPHAGOGASTRODUODENOSCOPY (EGD) WITH PROPOFOL;  Surgeon: [name], MD;  Location: [facility];  Service: Gastroenterology;  Laterality: N/A;  . GANGLION CYST EXCISION    . KNEE ARTHROSCOPY  age about 52 yrs  . TONSILLECTOMY    . TOTAL ABDOMINAL HYSTERECTOMY W/ BILATERAL SALPINGOOPHORECTOMY  at age 2 yrs   For endometriosis   Family History  Family History  Problem Relation Age of Onset  . Colon cancer Brother   . Colon cancer Brother   . Stroke Mother   . Colon cancer Father   . Breast cancer Daughter 63    ER/PR+ stage II   Social History  reports that she quit smoking about 9 months ago. Her smoking use included Cigarettes. She has a 60.00 pack-year smoking history.  She has never used smokeless tobacco. She reports that she does not drink alcohol or use drugs. Allergies  Allergies  Allergen Reactions  . Penicillins Other (See Comments)    Unknown; childhood allergy  . Remeron [Mirtazapine] Other (See Comments)    nightmares  . Latex Rash   Home medications Prior to Admission medications   Medication Sig Start Date End Date Taking? Authorizing Provider  amLODipine (NORVASC) 5 MG tablet TAKE 1 TABLET (5 MG TOTAL) BY MOUTH DAILY. 02/06/16  Yes [name], MD  citalopram (CELEXA) 20 MG tablet Take 1 tablet (20 mg total) by mouth daily. 06/27/16  Yes [name], MD  clotrimazole-betamethasone (LOTRISONE) cream Apply 1 application topically 2 (two) times daily. Apply to perianal skin twice daily. 06/15/16  Yes [name], MD  diphenoxylate-atropine (LOMOTIL) 2.5-0.025 MG tablet Take 1-2 tablets by mouth 4 (four) times daily as needed for diarrhea or loose stools. 08/23/16  Yes [name], MD  furosemide (LASIX) 20 MG tablet Take 20 mg by mouth 2 (two) times daily.   Yes Historical Provider, MD  lactobacillus acidophilus & bulgar (LACTINEX) chewable tablet Chew 1 tablet by mouth 3 (three) times daily with meals. 05/26/16  Yes [name], MD  lidocaine-prilocaine (EMLA) cream Apply small amount over port 1-2 hours prior to treatment, cover with plastic wrap (DO NOT RUB IN). 08/27/15  Yes Gautam Juleen China, MD  LORazepam (ATIVAN) 1 MG tablet Take 1 tablet (1 mg total) by mouth every 8 (eight) hours as needed for anxiety (or nausea). 08/19/16  Yes [name], MD  mesalamine (LIALDA) 1.2 g EC tablet Take 2 tablets (2.4 g total) by mouth daily with breakfast. 06/15/16  Yes [name], MD  potassium chloride (MICRO-K) 10 MEQ CR capsule Take 10 mEq by mouth daily.  08/17/16  Yes Historical Provider, MD  PRESCRIPTION MEDICATION Antibody Plan [place]   Yes Historical Provider, MD  zolpidem (AMBIEN CR) 12.5 MG CR tablet Take 1 tablet  (12.5 mg total) by mouth at bedtime as needed for sleep. 08/05/16  Yes [place], MD  NICOTINE STEP 1 21 MG/24HR patch PLACE 1 PATCH (21 MG TOTAL) ONTO THE SKIN DAILY. Patient not taking: Reported on 09/01/2016 03/05/16   [name], MD  omeprazole (PRILOSEC) 40 MG capsule TAKE ONE CAPSULE BY MOUTH EVERY DAY *NEEDS OFFICE VISIT FOR FURTHER REFILL* 07/06/16   [name], MD  ondansetron (ZOFRAN) 8 MG tablet Take 1 tablet (8 mg total) by mouth every 8 (eight) hours as needed for nausea or vomiting. 07/15/16   Susanne Borders, NP  oxyCODONE-acetaminophen (PERCOCET/ROXICET) 5-325 MG tablet  Take 1 tablet by mouth every 4 (four) hours as needed for severe pain. 07/26/16   [name], MD  polycarbophil (FIBERCON) 625 MG tablet Take 2 tablets (1,250 mg total) by mouth 2 (two) times daily. Patient not taking: Reported on 09/01/2016 01/19/16   [name], MD  potassium chloride SA (K-DUR,KLOR-CON) 20 MEQ tablet Take 2 tab(25mq) po twice daily for 3 days then 2tabs once daily for 7 days then 1 tab po daily Patient not taking: Reported on 09/01/2016 08/23/16   GBrunetta Genera MD  potassium chloride SA (K-DUR,KLOR-CON) 20 MEQ tablet Take 1 tablet (20 mEq total) by mouth daily. While on lasix Patient not taking: Reported on 09/01/2016 08/23/16   GBrunetta Genera MD  predniSONE (DELTASONE) 20 MG tablet Take 1 tablet (20 mg total) by mouth daily with breakfast. Patient not taking: Reported on 09/01/2016 08/11/16   GBrunetta Genera MD  valsartan (DIOVAN) 80 MG tablet Take 80 mg by mouth daily.  02/03/16   Historical Provider, MD   Liver Function Tests  Recent Labs Lab 09/01/16 1808 09/02/16 0415 09/05/16 0435  AST 81* 64* 67*  ALT 102* 89* 77*  ALKPHOS 63 58 64  BILITOT 0.9 0.5 0.5  PROT 6.0* 5.3* 5.8*  ALBUMIN 2.6* 2.3* 2.1*    Recent Labs Lab 09/01/16 1808 09/05/16 0435  LIPASE 26 22   CBC  Recent Labs Lab 09/01/16 2235 09/02/16 0415 09/04/16 0426  WBC  11.7* 11.4* 8.3  NEUTROABS  --  8.8*  --   HGB 10.9* 10.7* 12.2  HCT 33.1* 32.2* 35.8*  MCV 84.0 84.3 81.2  PLT 165 185 2474  Basic Metabolic Panel  Recent Labs Lab 09/01/16 1808 09/01/16 2235 09/02/16 0415 09/04/16 0426 09/05/16 0435 09/06/16 0410  NA 134*  --  137 139 137 137  K 2.5*  --  3.7 2.6* 4.0 4.7  CL 106  --  113* 113* 116* 122*  CO2 18*  --  18* 16* 14* 11*  GLUCOSE 88  --  100* 130* 120* 86  BUN 33*  --  28* 22* 35* 40*  CREATININE 1.48* 1.21* 1.06* 1.50* 1.84* 2.00*  CALCIUM 7.6*  --  7.0* 6.9* 6.6* 5.9*   Iron/TIBC/Ferritin/ %Sat No results found for: IRON, TIBC, FERRITIN, IRONPCTSAT  Vitals:   09/06/16 0424 09/06/16 1020 09/06/16 1100 09/06/16 1243  BP: (!) 136/58 139/60 (!) 136/57 131/64  Pulse: 87 94 97 95  Resp: 18   20  Temp: 97.4 F (36.3 C)     TempSrc: Oral   Oral  SpO2: 100%  100% 100%  Weight: 58.1 kg (128 lb 1.4 oz)     Height:       Exam Gen thin, tired, ill-appearing, not in distress No rash, cyanosis or gangrene Sclera anicteric, throat extremely dry  No jvd or bruits Chest clear bilat RRR no MRG Abd soft mild diffuse tenderness no hsm or ascites GU  defer MS no joint effusions or deformity Ext no LE or UE edema / no wounds or ulcers Neuro is alert, Ox 3 , nf  Hospital meds: Abx > azactam, Maxipime, vanc (9/28- 10/1) Other > norvasc, Celexa, lovenox, pepcid, mesalamine, zofran, protonix, KCl, Cagluc  Na 137  K 4.7  CO2 11  BUN 40  Cr 2.0  Ca 5.9  Alb 2.1 adj Ca 7.5   AST 67/ ALT 77  TBili 0.5   WBC 8k  Hb 12  plt 293 UA > turbid, few bact,  0-5 wbc/ rbc/ epi, 100 prot Urine Na < 10, UCr 210 9/29 CT angio chest (+ dye) > acute bilat PE w CT evidence of R heart strain. R basilar AS disease atx vs pna vs infarct. L basilar dz abx vs PNA. Emphysema.  10/1 CT abd no contrast > stable mets involving L iliac bone w assoc path fracture.  Patchy air space opacities bilat bases.  FAtty liver.   Assessment: 1. Acute kidney injury -  toxic ATN from contrast IV (w CT angio on 9/29).  Also looks dry but BP's have been normal sicne admission.  Plan give IVF"s, daily creat, should improve if situation doesn't worsen.  2. Bilat pulm embolus - on IV hep 3. Poss PNA - on IV abx 4. Volume - looks dry 5. Diarrhea - prob side effect from chemoRx, which is now on hold 6. HTN - controlled, on norvasc 7. Metastatic cancer, unknown primary (lung suspected)  Plan - increase IVF's (replace all GI/ GU losses + addt'l 75 cc/hr) and place Foley for now.  Avoid nsaid's and ACEi/ ARB for now.    Kelly Splinter MD [employer] pager [phone]   09/06/2016, 2:33 PM

## 2016-09-06 NOTE — Telephone Encounter (Signed)
No show letter mailed to patient. 

## 2016-09-06 NOTE — Progress Notes (Addendum)
No charge note:   Palliative consult received. Family meeting set for tomorrow at [place], AGNP-C Palliative Medicine  Please call Palliative Medicine team phone with any questions [phone]. For individual providers please see AMION.

## 2016-09-06 NOTE — Progress Notes (Signed)
Patient ID: Cindy Byrd, female   DOB: 16-Mar-1941, 75 y.o.   MRN: [id number]    Progress Note   Subjective   In better spirits today and feeling better- says no vomiting overnight or this am, and no nausea. One BM past 12 hours GI path panel pending   Objective   Vital signs in last 24 hours: Temp:  [97.4 F (36.3 C)-97.9 F (36.6 C)] 97.4 F (36.3 C) (10/03 0424) Pulse Rate:  [79-87] 87 (10/03 0424) Resp:  [18] 18 (10/03 0424) BP: (119-136)/(40-58) 136/58 (10/03 0424) SpO2:  [99 %-100 %] 100 % (10/03 0424) Weight:  [128 lb 1.4 oz (58.1 kg)] 128 lb 1.4 oz (58.1 kg) (10/03 0424) Last BM Date: 09/05/16 General:  elderly  white female in NAD, chronically ill appearing Heart:  Regular rate and rhythm; no murmurs Lungs: Respirations even and unlabored, lungs CTA bilaterally Abdomen:  Soft, mildly tender and nondistended. Normal bowel sounds. Extremities:  Without edema. Neurologic:  Alert and oriented,  grossly normal neurologically. Psych:  Cooperative. Normal mood and affect.  Intake/Output from previous day: 10/02 0701 - 10/03 0700 In: 3900 [P.O.:230; I.V.:3260; IV Piggyback:410] Out: -  Intake/Output this shift: No intake/output data recorded.  Lab Results:  Recent Labs  09/04/16 0426  WBC 8.3  HGB 12.2  HCT 35.8*  PLT 293   BMET  Recent Labs  09/04/16 0426 09/05/16 0435 09/06/16 0410  NA 139 137 137  K 2.6* 4.0 4.7  CL 113* 116* 122*  CO2 16* 14* 11*  GLUCOSE 130* 120* 86  BUN 22* 35* 40*  CREATININE 1.50* 1.84* 2.00*  CALCIUM 6.9* 6.6* 5.9*   LFT  Recent Labs  09/05/16 0435  PROT 5.8*  ALBUMIN 2.1*  AST 67*  ALT 77*  ALKPHOS 64  BILITOT 0.5  BILIDIR <0.1*  IBILI NOT CALCULATED   PT/INR No results for input(s): LABPROT, INR in the last 72 hours.  Studies/Results: Ct Abdomen Pelvis Wo Contrast  Result Date: 09/04/2016 CLINICAL DATA:  Metastatic non-small-cell lung cancer under observation. History of Barrett's esophagus, anemia and  colitis. History of esophageal adenocarcinoma. EXAM: CT ABDOMEN AND PELVIS WITHOUT CONTRAST TECHNIQUE: Multidetector CT imaging of the abdomen and pelvis was performed following the standard protocol without IV contrast. COMPARISON:  CT 06/24/2016 and 04/19/2016. FINDINGS: Lower chest: New patchy dependent airspace opacities in both lower lobes. There is no significant pleural or pericardial effusion. Hepatobiliary: There is decreased hepatic density consistent with steatosis. Multiple low-density hepatic lesions are grossly stable, likely cysts. No new or enlarging lesions are identified. The gallbladder is distended without wall thickening. No evidence of gallstones, surrounding inflammation or biliary dilatation. Pancreas: Unremarkable. No pancreatic ductal dilatation or surrounding inflammatory changes. Spleen: Normal in size without focal abnormality. Adrenals/Urinary Tract: Both adrenal glands appear normal. Both kidneys appear normal. There is no evidence of renal mass, hydronephrosis or urinary tract calculus. Left ovarian vein phleboliths are grossly stable. There is high-density material within the urinary bladder. No focal urothelial lesions are seen. Stomach/Bowel: The stomach is decompressed. There is fluid throughout the small bowel and colon. No bowel wall thickening or surrounding inflammatory change identified. The appendix appears normal. There are mild diverticular changes of the sigmoid colon. Rectal tube in place. Vascular/Lymphatic: There are no enlarged abdominal or pelvic lymph nodes. Aortic and branch vessel atherosclerosis. Reproductive: Hysterectomy.  No evidence of adnexal mass. Other: No ascites, peritoneal nodularity or free air. There is soft tissue stranding and subcutaneous air within the subcutaneous fat of the  left anterior abdominal wall, presumably from subcutaneous injections. Musculoskeletal: Grossly stable destructive mass involving the left iliac bone with associated  pathologic fracture. No new osseous lesions or new fractures are seen. IMPRESSION: 1. Grossly stable metastasis involving the left iliac bone with associated pathologic fracture. 2. No other evidence of metastatic disease within the abdomen or pelvis. 3. New patchy airspace opacities at both lung bases may reflect atelectasis or aspiration. Correlate clinically. 4. Fluid-filled small and large bowel without significant distention or wall thickening. Mild sigmoid diverticulosis. Electronically Signed   By: Richardean Sale M.D.   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 Infectious or related to worsening colitis - improved on round the clock anti emetics Wonder if she should have head CT r/o brain mets  #3 large bilateral PE's with right heart strain  9/29 - on Lovenox #4 ARF- Creat creeping up - defer to medicine  #5 ? HCAP - on Cefipime and Vanco #6  Barrett 's esophagus -with high grade dysplasia - eventual referral [facility] to [facility]  If and when she is well enough   Principal Problem:   HCAP (healthcare-associated pneumonia) Active Problems:   HTN (hypertension)   Metastatic lung cancer (metastasis from lung to other site) ([place])   Barrett's esophagus determined by biopsy   Hypokalemia   ARF (acute renal failure) ([place])   Pressure injury of skin   Cancer ([place])   Diarrhea   Ulcerative colitis ([place])     LOS: 4 days   Andree Golphin  09/06/2016, 9:02 AM

## 2016-09-06 NOTE — Progress Notes (Signed)
PROGRESS NOTE    Cindy Byrd  LGX:[id number] DOB: [birth year] DOA: 09/01/2016 PCP: Tamsen Roers, MD   Brief Narrative: Cindy Byrd is a 75 y.o. 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  She was found to have bilateral PE and a DVT. She was started on lovenox. Meanwhile her creatinine started worsening , CT ruled out obstructive causes. Renal consulted.    Assessment & Plan:   Principal Problem:   HCAP (healthcare-associated pneumonia) Active Problems:   HTN (hypertension)   Metastatic lung cancer (metastasis from lung to other site) ([place])   Barrett's esophagus determined by biopsy   Hypokalemia   ARF (acute renal failure) (HCC)   Pressure injury of skin   Cancer (HCC)   Diarrhea   Ulcerative colitis ([place])   Bilateral PE, DVT in the rigth common femoral vein: Currently on lovenox, possibly long term.     Metastatic adenocarcinoma; further management as per Dr Irene Limbo.    Pneumonia: Currently on vancomycin and aztreonam.  Discontinued IV vancomycin for acute renal failure and changed to cefepime.    Blood cultures not done on admission.  Febrile 9/29, repeat blood cultures ordered, pending and negative so far.   Acute renal failure with metabolic acidosis: Suspect pre renal from decreased po .  Hydrate and monitor renal parameters.  CT abd does not show any hydronephrosis.  Will start her on bicarb drip.  Renal consulted.   Hypertension: controlled.   Chronic diarrhea worsened after chemo: Watch electrolytes.  c diff negative.  flexi seal ordered.  GI consulted.   Nausea, vomiting. Profuse diarrhea and abdominal pain.  Phenergan added on .  CT abd and pelvis without contrast ordered is not significant.  GI consulted and recommendations given.    Hypokalemia: replete and repeat in am.     Deconditioning: after discussing with the patient and the son, palliative care consulted for goals of care meeting.    DVT prophylaxis: LOVENOX.  Code Status: (Full) Family Communication: spoke to son on the phone.  Disposition Plan: possibly to SNF when she is clinically better.     Consultants:   None.    Procedures: none   Antimicrobials: vancomycin and cefepime.   Subjective: persistent nausea, vomiting and diarrhea.   Objective: Vitals:   09/06/16 0424 09/06/16 1020 09/06/16 1100 09/06/16 1243  BP: (!) 136/58 139/60 (!) 136/57 131/64  Pulse: 87 94 97 95  Resp: 18   20  Temp: 97.4 F (36.3 C)     TempSrc: Oral   Oral  SpO2: 100%  100% 100%  Weight: 58.1 kg (128 lb 1.4 oz)     Height:        Intake/Output Summary (Last 24 hours) at 09/06/16 1351 Last data filed at 09/06/16 1100  Gross per 24 hour  Intake             3990 ml  Output                0 ml  Net             3990 ml   Filed Weights   09/04/16 0500 09/05/16 0158 09/06/16 0424  Weight: 60.4 kg (133 lb 2.5 oz) 56.9 kg (125 lb 7.1 oz) 58.1 kg (128 lb 1.4 oz)    Examination:  General exam: Appears uncomfortable, nauseated.  Respiratory system: Clear to auscultation. Respiratory effort  normal. Cardiovascular system: S1 & S2 heard, RRR. No JVD, murmurs, rubs, gallops or clicks. No pedal edema. Gastrointestinal system: Abdomen is nondistended, soft , mild tender. . No organomegaly or masses felt. Normal bowel sounds heard. Central nervous system: Alert and oriented. No focal neurological deficits. Extremities: Symmetric 5 x 5 power. Skin: No rashes, lesions or ulcers     Data Reviewed: I have personally reviewed following labs and imaging studies  CBC:  Recent Labs Lab 09/01/16 1808 09/01/16 2235 09/02/16 0415 09/04/16 0426  WBC 12.6* 11.7* 11.4* 8.3  NEUTROABS  --   --  8.8*  --   HGB 11.6* 10.9* 10.7* 12.2  HCT 35.2* 33.1* 32.2* 35.8*  MCV 84.2 84.0 84.3 81.2  PLT 173 165 185 161    Basic Metabolic Panel:  Recent Labs Lab 09/01/16 1808 09/01/16 2235 09/02/16 0415 09/04/16 0426 09/04/16 1030 09/05/16 0435 09/06/16 0410  NA 134*  --  137 139  --  137 137  K 2.5*  --  3.7 2.6*  --  4.0 4.7  CL 106  --  113* 113*  --  116* 122*  CO2 18*  --  18* 16*  --  14* 11*  GLUCOSE 88  --  100* 130*  --  120* 86  BUN 33*  --  28* 22*  --  35* 40*  CREATININE 1.48* 1.21* 1.06* 1.50*  --  1.84* 2.00*  CALCIUM 7.6*  --  7.0* 6.9*  --  6.6* 5.9*  MG 1.3* 2.3  --   --  1.7  --   --    GFR: Estimated Creatinine Clearance: 21.9 mL/min (by C-G formula based on SCr of 2 mg/dL (H)). Liver Function Tests:  Recent Labs Lab 09/01/16 1808 09/02/16 0415 09/05/16 0435  AST 81* 64* 67*  ALT 102* 89* 77*  ALKPHOS 63 58 64  BILITOT 0.9 0.5 0.5  PROT 6.0* 5.3* 5.8*  ALBUMIN 2.6* 2.3* 2.1*    Recent Labs Lab 09/01/16 1808 09/05/16 0435  LIPASE 26 22   No results for input(s): AMMONIA in the last 168 hours. Coagulation Profile:  Recent Labs Lab 09/02/16 1030  INR 1.44   Cardiac Enzymes:  Recent Labs Lab 09/01/16 2235 09/02/16 0415 09/02/16 1030  TROPONINI <0.03 <0.03 <0.03   BNP (last 3 results) No results for input(s): PROBNP in the last 8760 hours. HbA1C: No results for input(s): HGBA1C in the last 72 hours. CBG: No results for input(s): GLUCAP in the last 168 hours. Lipid Profile: No results for input(s): CHOL, HDL, LDLCALC, TRIG, CHOLHDL, LDLDIRECT in the last 72 hours. Thyroid Function Tests: No results for input(s): TSH, T4TOTAL, FREET4, T3FREE, THYROIDAB in the last 72 hours. Anemia Panel: No results for input(s): VITAMINB12, FOLATE, FERRITIN, TIBC, IRON, RETICCTPCT in the last 72 hours. Sepsis Labs: No results for input(s): PROCALCITON, LATICACIDVEN in the last 168 hours.  Recent Results (from the past 240 hour(s))  C difficile quick scan w PCR reflex     Status: None   Collection Time: 09/02/16  7:15 PM  Result Value Ref Range Status   C  Diff antigen NEGATIVE NEGATIVE Final   C Diff toxin NEGATIVE NEGATIVE Final   C Diff interpretation No C. difficile detected.  Final  Culture, blood (Routine X 2) w Reflex to ID Panel     Status: None (Preliminary result)   Collection Time: 09/03/16  6:05 PM  Result Value Ref Range Status   Specimen Description BLOOD RIGHT ARM  Final  Special Requests BOTTLES DRAWN AEROBIC AND ANAEROBIC 5CC  Final   Culture   Final    NO GROWTH 1 DAY Performed at [facility]    Report Status PENDING  Incomplete  Culture, blood (Routine X 2) w Reflex to ID Panel     Status: None (Preliminary result)   Collection Time: 09/03/16  6:11 PM  Result Value Ref Range Status   Specimen Description BLOOD RIGHT ARM  Final   Special Requests BOTTLES DRAWN AEROBIC ONLY 5CC  Final   Culture   Final    NO GROWTH 1 DAY Performed at [facility]    Report Status PENDING  Incomplete         Radiology Studies: Ct Abdomen Pelvis Wo Contrast  Result Date: 09/04/2016 CLINICAL DATA:  Metastatic non-small-cell lung cancer under observation. History of Barrett's esophagus, anemia and colitis. History of esophageal adenocarcinoma. EXAM: CT ABDOMEN AND PELVIS WITHOUT CONTRAST TECHNIQUE: Multidetector CT imaging of the abdomen and pelvis was performed following the standard protocol without IV contrast. COMPARISON:  CT 06/24/2016 and 04/19/2016. FINDINGS: Lower chest: New patchy dependent airspace opacities in both lower lobes. There is no significant pleural or pericardial effusion. Hepatobiliary: There is decreased hepatic density consistent with steatosis. Multiple low-density hepatic lesions are grossly stable, likely cysts. No new or enlarging lesions are identified. The gallbladder is distended without wall thickening. No evidence of gallstones, surrounding inflammation or biliary dilatation. Pancreas: Unremarkable. No pancreatic ductal dilatation or surrounding inflammatory changes. Spleen: Normal in size  without focal abnormality. Adrenals/Urinary Tract: Both adrenal glands appear normal. Both kidneys appear normal. There is no evidence of renal mass, hydronephrosis or urinary tract calculus. Left ovarian vein phleboliths are grossly stable. There is high-density material within the urinary bladder. No focal urothelial lesions are seen. Stomach/Bowel: The stomach is decompressed. There is fluid throughout the small bowel and colon. No bowel wall thickening or surrounding inflammatory change identified. The appendix appears normal. There are mild diverticular changes of the sigmoid colon. Rectal tube in place. Vascular/Lymphatic: There are no enlarged abdominal or pelvic lymph nodes. Aortic and branch vessel atherosclerosis. Reproductive: Hysterectomy.  No evidence of adnexal mass. Other: No ascites, peritoneal nodularity or free air. There is soft tissue stranding and subcutaneous air within the subcutaneous fat of the left anterior abdominal wall, presumably from subcutaneous injections. Musculoskeletal: Grossly stable destructive mass involving the left iliac bone with associated pathologic fracture. No new osseous lesions or new fractures are seen. IMPRESSION: 1. Grossly stable metastasis involving the left iliac bone with associated pathologic fracture. 2. No other evidence of metastatic disease within the abdomen or pelvis. 3. New patchy airspace opacities at both lung bases may reflect atelectasis or aspiration. Correlate clinically. 4. Fluid-filled small and large bowel without significant distention or wall thickening. Mild sigmoid diverticulosis. Electronically Signed   By: Richardean Sale M.D.   On: 09/04/2016 17:34        Scheduled Meds: . amLODipine  5 mg Oral Daily  . ceFEPime (MAXIPIME) IV  1 g Intravenous Q24H  . citalopram  20 mg Oral Daily  . enoxaparin (LOVENOX) injection  60 mg Subcutaneous Q24H  . famotidine (PEPCID) IV  20 mg Intravenous Q24H  . feeding supplement  1 Container Oral  TID BM  . lactobacillus acidophilus & bulgar  1 tablet Oral TID WC  . [START ON 09/07/2016] mesalamine  4.8 g Oral Q breakfast  . ondansetron (ZOFRAN) IV  8 mg Intravenous Q8H   Continuous Infusions: .  sodium bicarbonate infusion 1/4 NS 1000 mL 125 mL/hr at 09/06/16 1016     LOS: 4 days    Time spent: 30 minutes.     Hosie Poisson, MD [facility] Pager [phone]  If 7PM-7AM, please contact night-coverage [email or web address] Password [email or web address] 09/06/2016, 1:51 PM

## 2016-09-06 NOTE — Progress Notes (Signed)
CRITICAL VALUE ALERT  Critical value received:  Calcium 5.9   Date of notification:  09/06/16  Time of notification:  4818  Critical value read back:Yes   Nurse who received alert:  Elwin Sleight RN   MD notified (1st page):  Raliegh Ip Schorr   Time of first page:  [phone]  MD notified (2nd page):  Time of second page:  Responding MD:   Time MD responded:

## 2016-09-07 ENCOUNTER — Encounter (HOSPITAL_COMMUNITY): Payer: Self-pay | Admitting: Radiology

## 2016-09-07 ENCOUNTER — Inpatient Hospital Stay (HOSPITAL_COMMUNITY): Payer: Medicare Other

## 2016-09-07 DIAGNOSIS — R112 Nausea with vomiting, unspecified: Secondary | ICD-10-CM

## 2016-09-07 DIAGNOSIS — E876 Hypokalemia: Secondary | ICD-10-CM

## 2016-09-07 DIAGNOSIS — I2699 Other pulmonary embolism without acute cor pulmonale: Secondary | ICD-10-CM | POA: Diagnosis present

## 2016-09-07 DIAGNOSIS — K5100 Ulcerative (chronic) pancolitis without complications: Secondary | ICD-10-CM

## 2016-09-07 DIAGNOSIS — I82403 Acute embolism and thrombosis of unspecified deep veins of lower extremity, bilateral: Secondary | ICD-10-CM | POA: Diagnosis present

## 2016-09-07 DIAGNOSIS — N179 Acute kidney failure, unspecified: Secondary | ICD-10-CM

## 2016-09-07 DIAGNOSIS — C801 Malignant (primary) neoplasm, unspecified: Secondary | ICD-10-CM

## 2016-09-07 DIAGNOSIS — Z7189 Other specified counseling: Secondary | ICD-10-CM

## 2016-09-07 DIAGNOSIS — I824Y3 Acute embolism and thrombosis of unspecified deep veins of proximal lower extremity, bilateral: Secondary | ICD-10-CM

## 2016-09-07 DIAGNOSIS — Z515 Encounter for palliative care: Secondary | ICD-10-CM

## 2016-09-07 HISTORY — DX: Other pulmonary embolism without acute cor pulmonale: I26.99

## 2016-09-07 HISTORY — DX: Acute embolism and thrombosis of unspecified deep veins of lower extremity, bilateral: I82.403

## 2016-09-07 LAB — BASIC METABOLIC PANEL
Anion gap: 7 (ref 5–15)
BUN: 43 mg/dL — AB (ref 6–20)
CHLORIDE: 116 mmol/L — AB (ref 101–111)
CO2: 14 mmol/L — ABNORMAL LOW (ref 22–32)
CREATININE: 1.82 mg/dL — AB (ref 0.44–1.00)
Calcium: 5.3 mg/dL — CL (ref 8.9–10.3)
GFR calc Af Amer: 30 mL/min — ABNORMAL LOW (ref >60)
GFR calc non Af Amer: 26 mL/min — ABNORMAL LOW (ref >60)
GLUCOSE: 72 mg/dL (ref 65–99)
POTASSIUM: 3.4 mmol/L — AB (ref 3.5–5.1)
SODIUM: 137 mmol/L (ref 135–145)

## 2016-09-07 LAB — MAGNESIUM: MAGNESIUM: 1.9 mg/dL (ref 1.7–2.4)

## 2016-09-07 LAB — C-REACTIVE PROTEIN: CRP: 9.3 mg/dL — AB (ref <1.0)

## 2016-09-07 LAB — PHOSPHORUS: PHOSPHORUS: 1.7 mg/dL — AB (ref 2.5–4.6)

## 2016-09-07 MED ORDER — SODIUM PHOSPHATES 45 MMOLE/15ML IV SOLN
5.0000 mmol | Freq: Once | INTRAVENOUS | Status: AC
  Administered 2016-09-07: 5 mmol via INTRAVENOUS
  Filled 2016-09-07: qty 1.67

## 2016-09-07 MED ORDER — BUDESONIDE 3 MG PO CPEP
9.0000 mg | ORAL_CAPSULE | Freq: Every day | ORAL | Status: DC
  Administered 2016-09-07 – 2016-09-13 (×7): 9 mg via ORAL
  Filled 2016-09-07 (×7): qty 3

## 2016-09-07 MED ORDER — CALCIUM CARBONATE-VITAMIN D 500-200 MG-UNIT PO TABS
2.0000 | ORAL_TABLET | Freq: Three times a day (TID) | ORAL | Status: DC
  Administered 2016-09-07 – 2016-09-13 (×19): 2 via ORAL
  Filled 2016-09-07 (×3): qty 2
  Filled 2016-09-07 (×3): qty 1
  Filled 2016-09-07 (×9): qty 2
  Filled 2016-09-07: qty 1
  Filled 2016-09-07 (×4): qty 2

## 2016-09-07 MED ORDER — POTASSIUM CHLORIDE CRYS ER 20 MEQ PO TBCR
40.0000 meq | EXTENDED_RELEASE_TABLET | Freq: Once | ORAL | Status: AC
  Administered 2016-09-07: 40 meq via ORAL
  Filled 2016-09-07: qty 2

## 2016-09-07 NOTE — Progress Notes (Signed)
MEDICATION RELATED CONSULT NOTE - INITIAL   Pharmacy Consult for Phosphate Replacement Indication: Hypophosphatemia  Allergies  Allergen Reactions  . Penicillins Other (See Comments)    Unknown; childhood allergy  . Remeron [Mirtazapine] Other (See Comments)    nightmares  . Latex Rash    Patient Measurements: Height: '5\' 8"'$  (172.7 cm) Weight: 138 lb 14.2 oz (63 kg) IBW/kg (Calculated) : 63.9  Vital Signs: Temp: 98.5 F (36.9 C) (10/04 1600) Temp Source: Oral (10/04 1600) BP: 136/46 (10/04 1900) Pulse Rate: 96 (10/04 1900) Intake/Output from previous day: 10/03 0701 - 10/04 0700 In: 5040.4 [P.O.:120; I.V.:4033.3; IV Piggyback:562] Out: 2075 [Urine:525; ELFYB:0175] Intake/Output from this shift: No intake/output data recorded.  Labs:  Recent Labs  09/05/16 0435 09/05/16 1426 09/06/16 0410 09/07/16 0408 09/07/16 1130  CREATININE 1.84*  --  2.00* 1.82*  --   LABCREA  --  210.22  --   --   --   MG  --   --   --  1.9  --   PHOS  --   --   --   --  1.7*  ALBUMIN 2.1*  --   --   --   --   PROT 5.8*  --   --   --   --   AST 67*  --   --   --   --   ALT 77*  --   --   --   --   ALKPHOS 64  --   --   --   --   BILITOT 0.5  --   --   --   --   BILIDIR <0.1*  --   --   --   --   IBILI NOT CALCULATED  --   --   --   --    Estimated Creatinine Clearance: 26.2 mL/min (by C-G formula based on SCr of 1.82 mg/dL (H)).   Medical History: Past Medical History:  Diagnosis Date  . Barrett's esophagus   . Bone neoplasm 06/24/2015  . Cancer [facility])    metastatic poorly differentiated carcinoma. tumor left groin surgical removal with radiation tx.  . Cataract    BILATERAL  . Cigarette smoker two packs a day or less    Currently still smoking 2 PPD - Not interested in quitting at this time.  . Colitis 2017  . Colon polyps    hyperplastic, tubular adenomas, tubulovillous adenoma  . Cough, persistent    hx. lung cancer ? primary-being evaluated, unsure of primary site.  .  Depression 06/24/2015  . Diverticulosis   . Emphysema of lung ([place])   . Endometriosis    Hysterectomy with BSO at age 75 yrs  . Esophageal adenocarcinoma ([place]) 08/11/15   intramucosal  . Gastritis   . GERD (gastroesophageal reflux disease)   . H/O: pneumonia   . Heavy smoker (more than 20 cigarettes per day) 06/24/2015  . Hiatal hernia   . Hyperlipidemia   . Hypertension 06/24/2015   likely improved incidental to 40 lbs weight loss from her neoplasm. No Longer taking med for this as of 08-06-15  . IBS (irritable bowel syndrome)   . Pain    left hip-persistent"tumor of bone"-radiation tx. 10.  . Pulmonary nodules   . Swelling of ankle    bilateral  . Vitamin D deficiency disease     Assessment: 75 y/oF presented to ED on 09/01/16 with chest pain. 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 Patient currently not receiving any chemotherapy but is receiving Xgeva for bone mets. Patient known to pharmacy for antibiotic dosing for HCAP and lovenox dosing for PE. Pharmacy now consulted to assist with phosphate replacement. Of note, patient now with AKI likely secondary to ATN from IV contrast and prerenal azotemia due to volume depletion.   Today, 09/07/16: - SCr: 1.82 with CrCl ~ 26 ml/min - K+: 3.4-Received KCl 3mq PO x 1 today - Na WNL - Phos: 1.7 mg/dL - Corrected Ca: 6.82, PO replacement ordered per MD  Plan:   Sodium phosphate 5 mmol IV x 1 (conservative dosing secondary to acute renal insufficiency)  Repeat phosphate level 6 hours after end of infusion and redose, if indicated. CMET already ordered for AM.     JLindell Spar PharmD, BCPS Pager: [phone]/03/2016 7:53 PM

## 2016-09-07 NOTE — Progress Notes (Signed)
CRITICAL VALUE ALERT  Critical value received:  Calcium 5.3  Date of notification:  09/07/2016  Time of notification:  5038  Critical value read back:Yes.    Nurse who received alert:  Reche Dixon  MD notified (1st page):  Tylene Fantasia (NP on call)  Time of first page:  0435  MD notified (2nd page):  Time of second page:  Responding MD:  Tylene Fantasia  Time MD responded:  [phone]

## 2016-09-07 NOTE — Progress Notes (Signed)
[facility] Progress Note   Subjective: creat down 1.8, UOP 300 cc yest, 5 L in.  Diarrhea sig amounts w rectal tube in place.   Vitals:   09/07/16 0800 09/07/16 0900 09/07/16 1000 09/07/16 1200  BP: 112/62 (!) 134/58 (!) 157/65   Pulse: 96 100 (!) 102   Resp: (!) 23 (!) 23 (!) 23   Temp: 97.9 F (36.6 C)   97.7 F (36.5 C)  TempSrc: Oral   Oral  SpO2: 100% 99% 99%   Weight:      Height:        Inpatient medications: . amLODipine  5 mg Oral Daily  . budesonide  9 mg Oral Daily  . calcium-vitamin D  2 tablet Oral TID  . ceFEPime (MAXIPIME) IV  1 g Intravenous Q24H  . chlorhexidine  15 mL Mouth Rinse BID  . citalopram  20 mg Oral Daily  . enoxaparin (LOVENOX) injection  60 mg Subcutaneous Q24H  . famotidine (PEPCID) IV  20 mg Intravenous Q24H  . feeding supplement  1 Container Oral TID BM  . lactobacillus acidophilus & bulgar  1 tablet Oral TID WC  . mouth rinse  15 mL Mouth Rinse q12n4p  . mesalamine  4.8 g Oral Q breakfast  . ondansetron (ZOFRAN) IV  8 mg Intravenous Q8H   .  sodium bicarbonate infusion 1/4 NS 1000 mL 250 mL/hr at 09/07/16 1409   acetaminophen **OR** acetaminophen, diphenoxylate-atropine, hydrALAZINE, LORazepam, oxyCODONE-acetaminophen, promethazine, sodium chloride flush, zolpidem  Exam: Gen thin, tired, ill-appearing, no distress Sclera anicteric, throat less dry No jvd or bruits Chest clear bilat RRR no MRG Abd soft mild diffuse tenderness no hsm or ascites GU  defer MS no joint effusions or deformity Ext no LE or UE edema / no wounds or ulcers Neuro is alert, Ox 3 , nf  Hospital meds: Abx > azactam, Maxipime, vanc (9/28- 10/1) Other > norvasc, Celexa, lovenox, pepcid, mesalamine, zofran, protonix, KCl, Cagluc   UA > turbid, few bact, 0-5 wbc/ rbc/ epi, 100 prot Urine Na < 10, UCr 210 9/29 CT angio chest (+ dye) > acute bilat PE w CT evidence of R heart strain. Other... 10/1 CT abd no contrast > stable mets involving L  iliac bone w assoc path fracture.  Patchy air space opacities bilat bases.  Fatty liver.   Assessment: 1. Acute kidney injury - contrast-induced nephropathy + vol depletion.  Replacing losses + 75 cc/hr. Looks a little more hydrated.  Cont IVF's. Cr down 1.8.   2. Bilat pulm embolus - on IV hep 3. Poss PNA - on IV abx 4. Volume - less dry 5. Diarrhea - prob side effect from chemoRx, which is now on hold 6. HTN - controlled, on norvasc 7. Metastatic cancer, unknown primary (lung suspected)  Plan - as above   Kelly Splinter MD [place] pager [phone]   09/07/2016, 2:49 PM    Recent Labs Lab 09/05/16 0435 09/06/16 0410 09/07/16 0408 09/07/16 1130  NA 137 137 137  --   K 4.0 4.7 3.4*  --   CL 116* 122* 116*  --   CO2 14* 11* 14*  --   GLUCOSE 120* 86 72  --   BUN 35* 40* 43*  --   CREATININE 1.84* 2.00* 1.82*  --   CALCIUM 6.6* 5.9* 5.3*  --   PHOS  --   --   --  1.7*    Recent Labs Lab 09/01/16 1808 09/02/16 0415 09/05/16  0435  AST 81* 64* 67*  ALT 102* 89* 77*  ALKPHOS 63 58 64  BILITOT 0.9 0.5 0.5  PROT 6.0* 5.3* 5.8*  ALBUMIN 2.6* 2.3* 2.1*    Recent Labs Lab 09/01/16 2235 09/02/16 0415 09/04/16 0426  WBC 11.7* 11.4* 8.3  NEUTROABS  --  8.8*  --   HGB 10.9* 10.7* 12.2  HCT 33.1* 32.2* 35.8*  MCV 84.0 84.3 81.2  PLT 165 185 293   Iron/TIBC/Ferritin/ %Sat No results found for: IRON, TIBC, FERRITIN, IRONPCTSAT  8.

## 2016-09-07 NOTE — Progress Notes (Signed)
Nutrition Follow-up  DOCUMENTATION CODES:   Severe malnutrition in context of acute illness/injury  INTERVENTION:  - RD will monitor for POC/GOC pending Palliative Care meeting and will provide recommendations based on this. - RD will follow-up with recommendations 10/5  NUTRITION DIAGNOSIS:   Malnutrition (Severe) related to acute illness as evidenced by energy intake < or equal to 50% for > or equal to 5 days, percent weight loss. -ongoing  GOAL:   Patient will meet greater than or equal to 90% of their needs -unmet  MONITOR:   PO intake, Supplement acceptance, Labs, I & O's, Skin, Weight trends  ASSESSMENT:   75 y.o. 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   10/4 No meal completion documented since previous assessment. Pt reports she has been having ongoing, daily nausea and abdominal pain. She denies having anything to eat yesterday or this AM and states that she has only been drinking water. Palliative Care following pt and plan for meeting scheduled at 1300 today. Will follow-up tomorrow to review discussion during meeting and provide nutrition-related recommendations as appropriate based on POC/GOC at that time.   Medications reviewed; Labs reviewed; K: 3.4 mmol/L, Cl: 116 mmol/L, BUN: 43 mg/dL, creatinine: 1.82 mg/dL, Ca: 5.3 mg/dL, AST/ALT elevated, GFR: 26 mL/min.    10/2 - Pt is receiving infusions of Denosumab. Last infusion was 08/23/2016.  - Attempted to see patient on 9/29, but she did not feel well.  - Pt once again does not feel well today, and was not able to answer many questions.  - She reports UBW is 145 lbs. Per chart, pt has lost 9 kg (14% body weight) over approximately 2 months (since 7/24). This is significant  for this time frame.  - She also reports she had poor intake for the past few months, but in the past 7 days she has not had anything to eat.  - She reports poor appetite is secondary to severe nausea and early satiety. Also endorses diarrhea. - Meal Completion: 0-5% per chart. Patient reports the only thing she had yesterday was sips of Sprite. - Unable to complete Nutrition-Focused physical exam as patient refusing at this time. - Discussed plan with RN. When patient tries to eat or drink anything she has emesis.    Diet Order:  DIET SOFT Room service appropriate? Yes; Fluid consistency: Thin  Skin:  Wound (see comment) (Stage 2 R buttocks and Stage 1 bilateral buttocks pressure injuries)  Last BM:  ongoing today via rectal tube  Height:   Ht Readings from Last 1 Encounters:  09/01/16 '5\' 8"'$  (1.727 m)    Weight:   Wt Readings from Last 1 Encounters:  09/07/16 138 lb 14.2 oz (63 kg)    Ideal Body Weight:  63.64 kg  BMI:  Body mass index is 21.12 kg/m.  Estimated Nutritional Needs:   Kcal:  1700-2000  Protein:  70-85 grams  Fluid:  1.7-2 L/day  EDUCATION NEEDS:   Education needs no appropriate at this time (Review Nausea MNT prior to d/c.)    Jarome Matin, MS, RD, LDN Inpatient Clinical Dietitian Pager # [phone] After hours/weekend pager # [phone]'

## 2016-09-07 NOTE — Progress Notes (Signed)
PROGRESS NOTE    Cindy Byrd  YIF:[id number] DOB: 11/21/1941 DOA: 09/01/2016 PCP: Tamsen Roers, MD    Brief Narrative:   Cindy Tilmon Toweryis a 75 y.o.femalewith metastatic non-small cell lung cancer under observation, hypertension, Barrett's esophagus, anemia and colitis secondary to Nivolumabwith persistent diarrhea presents to the ER because of chest pain.  She was found to have bilateral PE and a DVT. Patient also noted to have a pneumonia. She was started on lovenox. Meanwhile her creatinine started worsening , CT ruled out obstructive causes. Renal consulted. GI following.    Assessment & Plan:   Principal Problem:   Bilateral pulmonary embolism (HCC) Active Problems:   HCAP (healthcare-associated pneumonia)   ARF (acute renal failure) (HCC)   Diarrhea   DVT of lower extremity, bilateral (HCC)   Protein calorie malnutrition (HCC)   HTN (hypertension)   Metastatic lung cancer (metastasis from lung to other site) ([place])   Barrett's esophagus determined by biopsy   Nausea with vomiting   Hypokalemia   Pressure injury of skin   Cancer ([place])   Ulcerative colitis ([place])  #1 bilateral pulmonary emboli/bilateral DVT Likely secondary to metastatic lung cancer. Patient with some clinical improvement. CT angiogram chest 09/02/2016 with acute bilateral PE with evidence of right heart strain consistent at least with submassive PE. Bilateral lower extremity Dopplers for DVT in the right common femoral vein, right profunda femoris vein, right femoral vein, right popliteal vein, right posterior tibial vein,) new vein, right Gastro name is vein, left posterior tibial vein, left perineal vein and left gastric name is pain. Patient currently on full dose Lovenox for pharmacy. Will likely need lifelong anticoagulation with Lovenox. 2-D echo with a EF of 55-60% with no wall motion abnormalities. Right ventricular systolic function was normal. Will need outpatient follow-up with her  hematologist/oncologist.  #2 healthcare associated pneumonia Patient is currently afebrile. Some clinical improvement. WBCs normalized. Blood cultures with no growth to date. Sputum Gram stain and culture pending. Continue empiric IV cefepime. Follow.  #3 hypokalemia Likely secondary to GI losses. Replete.  #4 ARF Likely secondary to ATN from IV contrast from CT angiogram 09/02/2016 and also prerenal azotemia due to volume depletion from GI losses from diarrhea. Creatinine currently at 1.82. Patient has been seen in consultation by nephrology. Continue aggressive IV fluid hydration per nephrology recommendations. Appreciate nephrology input and recommendations.  #5 metabolic acidosis Likely secondary to acute renal failure and diarrhea. On a bicarbonate drip. Slowly improving. Per renal.  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 Patient has been off Nivolumab 3 months have a never took Chad as direct it. Patient still with significant amount of diarrhea with rectal pouch in place. Patient with 1.55 L over the past 24 hours. C. difficile PCR negative. GI pathogen panel negative. CT abdomen and pelvis with grossly stable metastases involving the left iliac bone with associated pathologic fracture. Fluid-filled small and large bowel without significant distention no wall thickening. Mild sigmoid diverticulosis. New patchy airspace opacities of both lung bases. No other evidence of metastatic disease within the abdomen and pelvis. Patient is being followed by GI were recommending probably starting patient on steroids however will defer to GI. GI following and appreciate input and recommendations. IV fluids. Supportive care.  #7 hypocalcemia Questionable etiology. Corrected calcium is 6.82. Magnesium level at 1.9. Will check a phosphorus level, vitamin D levels, ionized calcium. I climb phosphatases 64. Check a PTH. Replacement oral calcium and  vitamin D.  #8  Barrett's esophagus with high-grade dysplasia Per GI. Patient will likely need to follow-up with [facility]  #9 metastatic non-small cell lung cancer with bilateral lung nodules and large metastatic lesion of the left ilium Patient is to follow-up with oncology, Dr. Irene Limbo as outpatient. Oncology has been notified via [place].  #10 protein calorie malnutrition Nutritional supplementation.    DVT prophylaxis: Full dose Lovenox Code Status: Full Family Communication: Updated patient. No family at bedside. Disposition Plan: Remaining step down unit. When medically stable with resolution of diarrhea, resolution of acute renal failure and clinical improvement likely to skilled nursing facility.   Consultants:   Nephrology: Dr.Schertz 09/06/2016  Gastroenterology: Dr. Silverio Decamp 09/05/2016  Procedures:  CT angiogram chest 09/02/2016 CT abdomen and pelvis 09/04/2016 Chest x-ray 09/01/2016 2-D echo 09/02/2016 Lower extremity Dopplers 09/02/2016    Antimicrobials:   IV Azactam 09/01/2016>>> 09/03/2016  IV cefepime 09/03/2016  IV vancomycin 09/01/2016>>>> 09/05/2016   Subjective: Patient states some improvement with chest pain. Some improvement with shortness of breath. Some nausea. No emesis. Still with diffuse abdominal pain however. Patient improving. Patient still with significant amount of diarrhea with output of 1.55 L over the past 24 hours.  Objective: Vitals:   09/07/16 0500 09/07/16 0600 09/07/16 0700 09/07/16 0800  BP: (!) 132/54 (!) 121/57 (!) 133/58 112/62  Pulse: 95 100 96 96  Resp: 18 20 (!) 21 (!) 23  Temp:    97.9 F (36.6 C)  TempSrc:    Oral  SpO2: 100% 100% 100% 100%  Weight: 63 kg (138 lb 14.2 oz)     Height:        Intake/Output Summary (Last 24 hours) at 09/07/16 0938 Last data filed at 09/07/16 0800  Gross per 24 hour  Intake          5040.35 ml  Output             2150 ml  Net          2890.35 ml   Filed Weights   09/05/16 0158  09/06/16 0424 09/07/16 0500  Weight: 56.9 kg (125 lb 7.1 oz) 58.1 kg (128 lb 1.4 oz) 63 kg (138 lb 14.2 oz)    Examination:  General exam: Weak. Dry mucous membranes. Respiratory system: Clear to auscultation anterior lung fields. Respiratory effort normal. Cardiovascular system: S1 & S2 heard, RRR. No JVD, murmurs, rubs, gallops or clicks. No pedal edema. Gastrointestinal system: Abdomen is nondistended, soft and diffusely tender to palpation. No organomegaly or masses felt. Normal bowel sounds heard. Central nervous system: Alert and oriented. No focal neurological deficits. Extremities: Symmetric 5 x 5 power. Skin: No rashes, lesions or ulcers Psychiatry: Judgement and insight appear normal. Mood & affect appropriate.     Data Reviewed: I have personally reviewed following labs and imaging studies  CBC:  Recent Labs Lab 09/01/16 1808 09/01/16 2235 09/02/16 0415 09/04/16 0426  WBC 12.6* 11.7* 11.4* 8.3  NEUTROABS  --   --  8.8*  --   HGB 11.6* 10.9* 10.7* 12.2  HCT 35.2* 33.1* 32.2* 35.8*  MCV 84.2 84.0 84.3 81.2  PLT 173 165 185 938   Basic Metabolic Panel:  Recent Labs Lab 09/01/16 1808 09/01/16 2235 09/02/16 0415 09/04/16 0426 09/04/16 1030 09/05/16 0435 09/06/16 0410 09/07/16 0408  NA 134*  --  137 139  --  137 137 137  K 2.5*  --  3.7 2.6*  --  4.0 4.7 3.4*  CL 106  --  113* 113*  --  116* 122*  116*  CO2 18*  --  18* 16*  --  14* 11* 14*  GLUCOSE 88  --  100* 130*  --  120* 86 72  BUN 33*  --  28* 22*  --  35* 40* 43*  CREATININE 1.48* 1.21* 1.06* 1.50*  --  1.84* 2.00* 1.82*  CALCIUM 7.6*  --  7.0* 6.9*  --  6.6* 5.9* 5.3*  MG 1.3* 2.3  --   --  1.7  --   --  1.9   GFR: Estimated Creatinine Clearance: 26.2 mL/min (by C-G formula based on SCr of 1.82 mg/dL (H)). Liver Function Tests:  Recent Labs Lab 09/01/16 1808 09/02/16 0415 09/05/16 0435  AST 81* 64* 67*  ALT 102* 89* 77*  ALKPHOS 63 58 64  BILITOT 0.9 0.5 0.5  PROT 6.0* 5.3* 5.8*    ALBUMIN 2.6* 2.3* 2.1*    Recent Labs Lab 09/01/16 1808 09/05/16 0435  LIPASE 26 22   No results for input(s): AMMONIA in the last 168 hours. Coagulation Profile:  Recent Labs Lab 09/02/16 1030  INR 1.44   Cardiac Enzymes:  Recent Labs Lab 09/01/16 2235 09/02/16 0415 09/02/16 1030  TROPONINI <0.03 <0.03 <0.03   BNP (last 3 results) No results for input(s): PROBNP in the last 8760 hours. HbA1C: No results for input(s): HGBA1C in the last 72 hours. CBG: No results for input(s): GLUCAP in the last 168 hours. Lipid Profile: No results for input(s): CHOL, HDL, LDLCALC, TRIG, CHOLHDL, LDLDIRECT in the last 72 hours. Thyroid Function Tests: No results for input(s): TSH, T4TOTAL, FREET4, T3FREE, THYROIDAB in the last 72 hours. Anemia Panel: No results for input(s): VITAMINB12, FOLATE, FERRITIN, TIBC, IRON, RETICCTPCT in the last 72 hours. Sepsis Labs: No results for input(s): PROCALCITON, LATICACIDVEN in the last 168 hours.  Recent Results (from the past 240 hour(s))  C difficile quick scan w PCR reflex     Status: None   Collection Time: 09/02/16  7:15 PM  Result Value Ref Range Status   C Diff antigen NEGATIVE NEGATIVE Final   C Diff toxin NEGATIVE NEGATIVE Final   C Diff interpretation No C. difficile detected.  Final  Culture, blood (Routine X 2) w Reflex to ID Panel     Status: None (Preliminary result)   Collection Time: 09/03/16  6:05 PM  Result Value Ref Range Status   Specimen Description BLOOD RIGHT ARM  Final   Special Requests BOTTLES DRAWN AEROBIC AND ANAEROBIC 5CC  Final   Culture   Final    NO GROWTH 2 DAYS Performed at [facility]    Report Status PENDING  Incomplete  Culture, blood (Routine X 2) w Reflex to ID Panel     Status: None (Preliminary result)   Collection Time: 09/03/16  6:11 PM  Result Value Ref Range Status   Specimen Description BLOOD RIGHT ARM  Final   Special Requests BOTTLES DRAWN AEROBIC ONLY 5CC  Final   Culture    Final    NO GROWTH 2 DAYS Performed at [facility]    Report Status PENDING  Incomplete  Gastrointestinal Panel by PCR , Stool     Status: None   Collection Time: 09/06/16 10:44 AM  Result Value Ref Range Status   Campylobacter species NOT DETECTED NOT DETECTED Final   Plesimonas shigelloides NOT DETECTED NOT DETECTED Final   Salmonella species NOT DETECTED NOT DETECTED Final   Yersinia enterocolitica NOT DETECTED NOT DETECTED Final   Vibrio species NOT DETECTED NOT  DETECTED Final   Vibrio cholerae NOT DETECTED NOT DETECTED Final   Enteroaggregative E coli (EAEC) NOT DETECTED NOT DETECTED Final   Enteropathogenic E coli (EPEC) NOT DETECTED NOT DETECTED Final   Enterotoxigenic E coli (ETEC) NOT DETECTED NOT DETECTED Final   Shiga like toxin producing E coli (STEC) NOT DETECTED NOT DETECTED Final   Shigella/Enteroinvasive E coli (EIEC) NOT DETECTED NOT DETECTED Final   Cryptosporidium NOT DETECTED NOT DETECTED Final   Cyclospora cayetanensis NOT DETECTED NOT DETECTED Final   Entamoeba histolytica NOT DETECTED NOT DETECTED Final   Giardia lamblia NOT DETECTED NOT DETECTED Final   Adenovirus F40/41 NOT DETECTED NOT DETECTED Final   Astrovirus NOT DETECTED NOT DETECTED Final   Norovirus GI/GII NOT DETECTED NOT DETECTED Final   Rotavirus A NOT DETECTED NOT DETECTED Final   Sapovirus (I, II, IV, and V) NOT DETECTED NOT DETECTED Final         Radiology Studies: No results found.      Scheduled Meds: . amLODipine  5 mg Oral Daily  . ceFEPime (MAXIPIME) IV  1 g Intravenous Q24H  . chlorhexidine  15 mL Mouth Rinse BID  . citalopram  20 mg Oral Daily  . enoxaparin (LOVENOX) injection  60 mg Subcutaneous Q24H  . famotidine (PEPCID) IV  20 mg Intravenous Q24H  . feeding supplement  1 Container Oral TID BM  . lactobacillus acidophilus & bulgar  1 tablet Oral TID WC  . mouth rinse  15 mL Mouth Rinse q12n4p  . mesalamine  4.8 g Oral Q breakfast  . ondansetron (ZOFRAN) IV  8  mg Intravenous Q8H   Continuous Infusions: .  sodium bicarbonate infusion 1/4 NS 1000 mL 150 mL/hr at 09/07/16 0800     LOS: 5 days    Time spent: 21 mins    Porchea Charrier, MD [facility] Pager [phone]  If 7PM-7AM, please contact night-coverage [email or web address] Password [email or web address] 09/07/2016, 9:38 AM

## 2016-09-07 NOTE — Consult Note (Signed)
Consultation Note Date: 09/07/2016   Patient Name: Cindy Byrd  DOB: 01-04-1941  MRN: [id number]  Age / Sex: 75 y.o., female  PCP: Tamsen Roers, MD Referring Physician: Eugenie Filler, MD  Reason for Consultation: Establishing goals of care  HPI/Patient Profile: 75 y.o. female  with past medical history of lung cancer with bone mets, colitis Barrett's esophagus, hypertension, admitted on 09/01/2016 with pneumonia. Further workup has also revealed hypokalemia, bilateral PE's, and acute renal failure.    Clinical Assessment and Goals of Care: Met with patient's daughter, Lars Pinks, son Timmothy Sours, and significant other Konrad Dolores (with whom she lives). Patient diagnosed with lung cancer last year and was being treated with nivolumab. However, treatment was stopped due to chronic diarrhea being caused by colitis from the nivolumab. Most recent CT scan showed stable disease with pathological illiac fracture, but no new acute changes. Patient presented to the ED on 9/28 with complaints of chest pain and was found to have pneumonia and bilateral PE's. Additionally, she now has metabolic acidosis and ARF presumably r/t her volume depletion from chronic diarrhea. Family tells me that patient used to be very active, and enjoyed working outside in her garden. However, in the last few months they've noticed a decline in her activity level. She has also had significant changes in her appetite and they've been trying to encourage her to eat. They attributed these changes to her diarrhea stating she didn't want to eat or drink because it would "run right through her" causing her to be incontinent of stool. Their goals for patient would be to stabilize her diarrhea and fluid status, treat what is treatable re: pnuemonia and PE's and then discharge home. They state patient would not want artificial ventilation or feeding. They also realize that  CPR for this patient would not be beneficial and request DNR status. Plan is to continue care for now with changes in code status, and revisit patient's status daily. My concern is that we are seeing a dying process given the overall decline in functional, cognitive and nutritional status the past few months. I discussed this with family and told them I may be wrong, but they do need to prepare for this possibility.  She has several problems that are causing insults to her body from many angles and will likely be hard for her to overcome.  I think this patient will declare herself over the next day or two and we will be able to further discuss disposition and reevaluate if there is a need to transition to comfort care. Regardless, I do believe that wherever she discharges to (family is hopeful for home) that Hospice support would be beneficial given patient's metastatic cancer diagnosis without plans for treatment.   Primary Decision Maker NEXT OF KIN : daughter- Lars Pinks; son- Timmothy Sours; sig other- Tommy    SUMMARY OF RECOMMENDATIONS -DNR -No tube feeding -Continue current level of care but do not escalate -Reeval daily for further prognosis and needs -Hospice upon discharge    Code Status/Advance Care Planning:  DNR    Symptom Management:   No recommendations at this time- main complaint is diarrhea and GI is managing  Palliative Prophylaxis:   Delirium Protocol  Additional Recommendations (Limitations, Scope, Preferences):  No Artificial Feeding  Psycho-social/Spiritual:   Desire for further Chaplaincy support:No  Additional Recommendations: none  Prognosis:    < 3 months based on metastatic cancer, PE's, recent functional and nutritional decline, however, this may change if pt renal function does not improve and diarrhea is not controlled  Discharge Planning: To Be Determined  Primary Diagnoses: Present on Admission: . HCAP (healthcare-associated pneumonia) . Metastatic lung  cancer (metastasis from lung to other site) [facility]) . Hypokalemia . HTN (hypertension) . Barrett's esophagus determined by biopsy . ARF (acute renal failure) ([place]) . Pressure injury of skin . Cancer ([place]) . Diarrhea . Nausea with vomiting . Protein calorie malnutrition ([place]) . Bilateral pulmonary embolism ([place]) . DVT of lower extremity, bilateral ([place])   I have reviewed the medical record, interviewed the patient and family, and examined the patient. The following aspects are pertinent.  Past Medical History:  Diagnosis Date  . Barrett's esophagus   . Bone neoplasm 06/24/2015  . Cancer [facility])    metastatic poorly differentiated carcinoma. tumor left groin surgical removal with radiation tx.  . Cataract    BILATERAL  . Cigarette smoker two packs a day or less    Currently still smoking 2 PPD - Not interested in quitting at this time.  . Colitis 2017  . Colon polyps    hyperplastic, tubular adenomas, tubulovillous adenoma  . Cough, persistent    hx. lung cancer ? primary-being evaluated, unsure of primary site.  . Depression 06/24/2015  . Diverticulosis   . Emphysema of lung ([place])   . Endometriosis    Hysterectomy with BSO at age 45 yrs  . Esophageal adenocarcinoma ([place]) 08/11/15   intramucosal  . Gastritis   . GERD (gastroesophageal reflux disease)   . H/O: pneumonia   . Heavy smoker (more than 20 cigarettes per day) 06/24/2015  . Hiatal hernia   . Hyperlipidemia   . Hypertension 06/24/2015   likely improved incidental to 40 lbs weight loss from her neoplasm. No Longer taking med for this as of 08-06-15  . IBS (irritable bowel syndrome)   . Pain    left hip-persistent"tumor of bone"-radiation tx. 10.  . Pulmonary nodules   . Swelling of ankle    bilateral  . Vitamin D deficiency disease    Social History   Social History  . Marital status: Widowed    Spouse name: N/A  . Number of children: 2  . Years of education: N/A   Social History Main Topics  . Smoking status:  Former Smoker    Packs/day: 1.00    Years: 60.00    Types: Cigarettes    Quit date: 12/06/2015  . Smokeless tobacco: Never Used  . Alcohol use No  . Drug use: No  . Sexual activity: No   Other Topics Concern  . None   Social History Narrative  . None   Family History  Problem Relation Age of Onset  . Colon cancer Brother   . Colon cancer Brother   . Stroke Mother   . Colon cancer Father   . Breast cancer Daughter 30    ER/PR+ stage II   Scheduled Meds: . amLODipine  5 mg Oral Daily  . budesonide  9 mg Oral Daily  . calcium-vitamin D  2 tablet Oral TID  .  ceFEPime (MAXIPIME) IV  1 g Intravenous Q24H  . chlorhexidine  15 mL Mouth Rinse BID  . citalopram  20 mg Oral Daily  . enoxaparin (LOVENOX) injection  60 mg Subcutaneous Q24H  . famotidine (PEPCID) IV  20 mg Intravenous Q24H  . feeding supplement  1 Container Oral TID BM  . lactobacillus acidophilus & bulgar  1 tablet Oral TID WC  . mouth rinse  15 mL Mouth Rinse q12n4p  . mesalamine  4.8 g Oral Q breakfast  . ondansetron (ZOFRAN) IV  8 mg Intravenous Q8H   Continuous Infusions: .  sodium bicarbonate infusion 1/4 NS 1000 mL 250 mL/hr at 09/07/16 1409   PRN Meds:.acetaminophen **OR** acetaminophen, diphenoxylate-atropine, hydrALAZINE, LORazepam, oxyCODONE-acetaminophen, promethazine, sodium chloride flush, zolpidem Medications Prior to Admission:  Prior to Admission medications   Medication Sig Start Date End Date Taking? Authorizing Provider  amLODipine (NORVASC) 5 MG tablet TAKE 1 TABLET (5 MG TOTAL) BY MOUTH DAILY. 02/06/16  Yes [name], MD  citalopram (CELEXA) 20 MG tablet Take 1 tablet (20 mg total) by mouth daily. 06/27/16  Yes [name], MD  clotrimazole-betamethasone (LOTRISONE) cream Apply 1 application topically 2 (two) times daily. Apply to perianal skin twice daily. 06/15/16  Yes [name], MD  diphenoxylate-atropine (LOMOTIL) 2.5-0.025 MG tablet Take 1-2 tablets by mouth 4 (four) times  daily as needed for diarrhea or loose stools. 08/23/16  Yes [name], MD  furosemide (LASIX) 20 MG tablet Take 20 mg by mouth 2 (two) times daily.   Yes Historical Provider, MD  lactobacillus acidophilus & bulgar (LACTINEX) chewable tablet Chew 1 tablet by mouth 3 (three) times daily with meals. 05/26/16  Yes [name], MD  lidocaine-prilocaine (EMLA) cream Apply small amount over port 1-2 hours prior to treatment, cover with plastic wrap (DO NOT RUB IN). 08/27/15  Yes Gautam Juleen China, MD  LORazepam (ATIVAN) 1 MG tablet Take 1 tablet (1 mg total) by mouth every 8 (eight) hours as needed for anxiety (or nausea). 08/19/16  Yes [name], MD  mesalamine (LIALDA) 1.2 g EC tablet Take 2 tablets (2.4 g total) by mouth daily with breakfast. 06/15/16  Yes [name], MD  potassium chloride (MICRO-K) 10 MEQ CR capsule Take 10 mEq by mouth daily.  08/17/16  Yes Historical Provider, MD  PRESCRIPTION MEDICATION Antibody Plan [place]   Yes Historical Provider, MD  zolpidem (AMBIEN CR) 12.5 MG CR tablet Take 1 tablet (12.5 mg total) by mouth at bedtime as needed for sleep. 08/05/16  Yes Pico Rivera, MD  NICOTINE STEP 1 21 MG/24HR patch PLACE 1 PATCH (21 MG TOTAL) ONTO THE SKIN DAILY. Patient not taking: Reported on 09/01/2016 03/05/16   [name], MD  omeprazole (PRILOSEC) 40 MG capsule TAKE ONE CAPSULE BY MOUTH EVERY DAY *NEEDS OFFICE VISIT FOR FURTHER REFILL* 07/06/16   [name], MD  ondansetron (ZOFRAN) 8 MG tablet Take 1 tablet (8 mg total) by mouth every 8 (eight) hours as needed for nausea or vomiting. 07/15/16   Susanne Borders, NP  oxyCODONE-acetaminophen (PERCOCET/ROXICET) 5-325 MG tablet Take 1 tablet by mouth every 4 (four) hours as needed for severe pain. 07/26/16   [name], MD  polycarbophil (FIBERCON) 625 MG tablet Take 2 tablets (1,250 mg total) by mouth 2 (two) times daily. Patient not taking: Reported on 09/01/2016 01/19/16   [name],  MD  potassium chloride SA (K-DUR,KLOR-CON) 20 MEQ tablet Take 2 tab(84mq) po  twice daily for 3 days then 2tabs once daily for 7 days then 1 tab po daily Patient not taking: Reported on 09/01/2016 08/23/16   [name], MD  potassium chloride SA (K-DUR,KLOR-CON) 20 MEQ tablet Take 1 tablet (20 mEq total) by mouth daily. While on lasix Patient not taking: Reported on 09/01/2016 08/23/16   [name], MD  predniSONE (DELTASONE) 20 MG tablet Take 1 tablet (20 mg total) by mouth daily with breakfast. Patient not taking: Reported on 09/01/2016 08/11/16   [name], MD  valsartan (DIOVAN) 80 MG tablet Take 80 mg by mouth daily.  02/03/16   Historical Provider, MD   Allergies  Allergen Reactions  . Penicillins Other (See Comments)    Unknown; childhood allergy  . Remeron [Mirtazapine] Other (See Comments)    nightmares  . Latex Rash   Review of Systems  Unable to perform ROS: Mental status change    Physical Exam  Constitutional:  frail  Cardiovascular:  tachycardic  Pulmonary/Chest: Effort normal.  diminished  Musculoskeletal: She exhibits edema.  Neurological:  Mumbling, incoherent, somnolent  Skin: Skin is warm and dry. There is pallor.    Vital Signs: BP (!) 157/65   Pulse (!) 102   Temp 97.7 F (36.5 C) (Oral)   Resp (!) 23   Ht 5' 8"  (1.727 m)   Wt 63 kg (138 lb 14.2 oz)   SpO2 99%   BMI 21.12 kg/m  Pain Assessment: No/denies pain   Pain Score: Asleep   SpO2: SpO2: 99 % O2 Device:SpO2: 99 % O2 Flow Rate: .O2 Flow Rate (L/min): 2 L/min  IO: Intake/output summary:  Intake/Output Summary (Last 24 hours) at 09/07/16 1453 Last data filed at 09/07/16 1409  Gross per 24 hour  Intake          5855.19 ml  Output             2750 ml  Net          3105.19 ml    LBM: Last BM Date: 09/07/16 Baseline Weight: Weight: 60.2 kg (132 lb 11.5 oz) Most recent weight: Weight: 63 kg (138 lb 14.2 oz)     Palliative Assessment/Data: PPS: 20%   Flowsheet  Rows   Flowsheet Row Most Recent Value  Intake Tab  Referral Department  Hospitalist  Unit at Time of Referral  Cardiac/Telemetry Unit  Palliative Care Primary Diagnosis  Cancer  Date Notified  09/06/16  Palliative Care Type  New Palliative care  Reason for referral  Clarify Goals of Care  Date of Admission  09/01/16  # of days IP prior to Palliative referral  5  Clinical Assessment  Psychosocial & Spiritual Assessment  Palliative Care Outcomes      Thank you for this consult. Palliative medicine will continue to follow and assist as needed.   Time In: 1315 Time Out: 1500 Time Total: 105 mins Greater than 50%  of this time was spent counseling and coordinating care related to the above assessment and plan.  Signed by: Mariana Kaufman, AGNP-C Palliative Medicine    Please contact Palliative Medicine Team phone at [phone] for questions and concerns.  For individual provider: See Shea Evans

## 2016-09-07 NOTE — Progress Notes (Signed)
Patient ID: KEYLEN UZELAC, female   DOB: 1941/06/24, 75 y.o.   MRN: [id number]    Progress Note   Subjective   Pt moved to step down - ARF- replacing all losses , hypocalcemia and hyokalemia  Diarrhea workup- GI path panel negative, elastase, fecal fat,lactoferrin pending CRP 9.3, ESR 23  Rectal tube  In -put out 1500 cc last night, 275 this am so far  Pt has no c/o nausea, admits to headache- pleasantly confused   Objective   Vital signs in last 24 hours: Temp:  [97.8 F (36.6 C)-98.3 F (36.8 C)] 97.9 F (36.6 C) (10/04 0800) Pulse Rate:  [83-100] 96 (10/04 0800) Resp:  [14-23] 23 (10/04 0800) BP: (97-155)/(44-75) 112/62 (10/04 0800) SpO2:  [99 %-100 %] 100 % (10/04 0800) Weight:  [138 lb 14.2 oz (63 kg)] 138 lb 14.2 oz (63 kg) (10/04 0500) Last BM Date: 09/06/16 General:    white female in NAD Heart:  Regular rate and rhythm; no murmurs Lungs: Respirations even and unlabored, lungs CTA bilaterally Abdomen:  Soft, mildly tender lower abd and nondistended. Normal bowel sounds. Extremities:  Without edema. Neurologic:  Alert and oriented x2 ,  grossly normal neurologically. Psych:  Cooperative. Normal mood and affect.  Intake/Output from previous day: 10/03 0701 - 10/04 0700 In: 5040.4 [P.O.:120; I.V.:4033.3; IV Piggyback:562] Out: 2075 [Urine:525; Stool:1550] Intake/Output this shift: Total I/O In: -  Out: 75 [Urine:25; Stool:50]  Lab Results: No results for input(s): WBC, HGB, HCT, PLT in the last 72 hours. BMET  Recent Labs  09/05/16 0435 09/06/16 0410 09/07/16 0408  NA 137 137 137  K 4.0 4.7 3.4*  CL 116* 122* 116*  CO2 14* 11* 14*  GLUCOSE 120* 86 72  BUN 35* 40* 43*  CREATININE 1.84* 2.00* 1.82*  CALCIUM 6.6* 5.9* 5.3*   LFT  Recent Labs  09/05/16 0435  PROT 5.8*  ALBUMIN 2.1*  AST 67*  ALT 77*  ALKPHOS 64  BILITOT 0.5  BILIDIR <0.1*  IBILI NOT CALCULATED   PT/INR No results for input(s): LABPROT, INR in the last 72  hours.  Studies/Results: No results found.     Assessment / Plan:    #1 75 yo female with  metastatic non small cell  Lung CA admitted with CP secondary to large bilateral PE's #2 HCAP- on Cefipime #3 persistent  Diarrhea- recently dx with immune mediated colitis felt secondary to Nivolumab-off since 06/2016 Did not start Lialda as recommended With rectal tube in able to quantify stool - having significant diarrhea Infectious workup negative this admit Will start Budesonide 9 mg po daily  #4 Barretts esophagus with HGD- had been referred to [facility] for ablation therapy but has been too sick to go back for EGD-  need guidance form Oncology as to her prognosis with lung CA and whether  Continued treatment for Barretts makes sense  #4 ARF - renal following #5 persistent nausea x 3 weeks-  Better on around the clock antiemetics- I think head CT is appropriate to r/o brain mets - will order- no IV contrast #6 AMS - somewhat confused past couple days  Palliative care seeing today for goals of care   Principal Problem:   Bilateral pulmonary embolism (HCC) Active Problems:   Protein calorie malnutrition ([place])   HTN (hypertension)   Metastatic lung cancer (metastasis from lung to other site) ([place])   Barrett's esophagus determined by biopsy   Nausea with vomiting   Hypokalemia   HCAP (healthcare-associated pneumonia)  ARF (acute renal failure) (HCC)   Pressure injury of skin   Cancer ([place])   Diarrhea   Ulcerative colitis ([place])   DVT of lower extremity, bilateral ([place])     LOS: 5 days   Lucette Kratz  09/07/2016, 10:15 AM

## 2016-09-08 DIAGNOSIS — B3781 Candidal esophagitis: Secondary | ICD-10-CM

## 2016-09-08 DIAGNOSIS — R079 Chest pain, unspecified: Secondary | ICD-10-CM

## 2016-09-08 DIAGNOSIS — I10 Essential (primary) hypertension: Secondary | ICD-10-CM

## 2016-09-08 DIAGNOSIS — E43 Unspecified severe protein-calorie malnutrition: Secondary | ICD-10-CM

## 2016-09-08 DIAGNOSIS — I4891 Unspecified atrial fibrillation: Secondary | ICD-10-CM | POA: Clinically undetermined

## 2016-09-08 DIAGNOSIS — I2699 Other pulmonary embolism without acute cor pulmonale: Principal | ICD-10-CM

## 2016-09-08 DIAGNOSIS — B370 Candidal stomatitis: Secondary | ICD-10-CM

## 2016-09-08 DIAGNOSIS — K529 Noninfective gastroenteritis and colitis, unspecified: Secondary | ICD-10-CM

## 2016-09-08 DIAGNOSIS — E559 Vitamin D deficiency, unspecified: Secondary | ICD-10-CM | POA: Diagnosis present

## 2016-09-08 DIAGNOSIS — E46 Unspecified protein-calorie malnutrition: Secondary | ICD-10-CM

## 2016-09-08 DIAGNOSIS — I82403 Acute embolism and thrombosis of unspecified deep veins of lower extremity, bilateral: Secondary | ICD-10-CM

## 2016-09-08 DIAGNOSIS — C7951 Secondary malignant neoplasm of bone: Secondary | ICD-10-CM

## 2016-09-08 LAB — MAGNESIUM: Magnesium: 1.5 mg/dL — ABNORMAL LOW (ref 1.7–2.4)

## 2016-09-08 LAB — COMPREHENSIVE METABOLIC PANEL
ALT: 42 U/L (ref 14–54)
AST: 51 U/L — ABNORMAL HIGH (ref 15–41)
Albumin: 1.6 g/dL — ABNORMAL LOW (ref 3.5–5.0)
Alkaline Phosphatase: 79 U/L (ref 38–126)
Anion gap: 8 (ref 5–15)
BILIRUBIN TOTAL: 1 mg/dL (ref 0.3–1.2)
BUN: 34 mg/dL — AB (ref 6–20)
CHLORIDE: 103 mmol/L (ref 101–111)
CO2: 20 mmol/L — ABNORMAL LOW (ref 22–32)
CREATININE: 1.46 mg/dL — AB (ref 0.44–1.00)
Calcium: 4.7 mg/dL — CL (ref 8.9–10.3)
GFR, EST AFRICAN AMERICAN: 39 mL/min — AB (ref >60)
GFR, EST NON AFRICAN AMERICAN: 34 mL/min — AB (ref >60)
Glucose, Bld: 70 mg/dL (ref 65–99)
POTASSIUM: 2.2 mmol/L — AB (ref 3.5–5.1)
Sodium: 131 mmol/L — ABNORMAL LOW (ref 135–145)
TOTAL PROTEIN: 4.3 g/dL — AB (ref 6.5–8.1)

## 2016-09-08 LAB — PTH, INTACT AND CALCIUM
Calcium, Total (PTH): 5.2 mg/dL — CL (ref 8.7–10.3)
PTH: 229 pg/mL — AB (ref 15–65)

## 2016-09-08 LAB — CBC
HEMATOCRIT: 28.9 % — AB (ref 36.0–46.0)
Hemoglobin: 9.8 g/dL — ABNORMAL LOW (ref 12.0–15.0)
MCH: 27.2 pg (ref 26.0–34.0)
MCHC: 33.9 g/dL (ref 30.0–36.0)
MCV: 80.3 fL (ref 78.0–100.0)
PLATELETS: 206 10*3/uL (ref 150–400)
RBC: 3.6 MIL/uL — ABNORMAL LOW (ref 3.87–5.11)
RDW: 15.7 % — AB (ref 11.5–15.5)
WBC: 2.7 10*3/uL — ABNORMAL LOW (ref 4.0–10.5)

## 2016-09-08 LAB — VITAMIN D 25 HYDROXY (VIT D DEFICIENCY, FRACTURES): VIT D 25 HYDROXY: 10.9 ng/mL — AB (ref 30.0–100.0)

## 2016-09-08 LAB — CALCIUM, IONIZED: CALCIUM, IONIZED, SERUM: 3.1 mg/dL — AB (ref 4.5–5.6)

## 2016-09-08 LAB — PHOSPHORUS: PHOSPHORUS: 1.3 mg/dL — AB (ref 2.5–4.6)

## 2016-09-08 LAB — TSH: TSH: 2.016 u[IU]/mL (ref 0.350–4.500)

## 2016-09-08 LAB — FECAL FAT, QUALITATIVE
Fat Qual Neutral, Stl: NORMAL
Fat Qual Total, Stl: NORMAL

## 2016-09-08 MED ORDER — METOPROLOL TARTRATE 5 MG/5ML IV SOLN
2.5000 mg | Freq: Once | INTRAVENOUS | Status: AC
  Administered 2016-09-08: 2.5 mg via INTRAVENOUS
  Filled 2016-09-08: qty 5

## 2016-09-08 MED ORDER — MAGIC MOUTHWASH
5.0000 mL | Freq: Three times a day (TID) | ORAL | Status: DC
  Administered 2016-09-08 – 2016-09-13 (×11): 5 mL via ORAL
  Filled 2016-09-08 (×18): qty 5

## 2016-09-08 MED ORDER — CEFPODOXIME PROXETIL 200 MG PO TABS
200.0000 mg | ORAL_TABLET | Freq: Every day | ORAL | Status: AC
  Administered 2016-09-08: 200 mg via ORAL
  Filled 2016-09-08: qty 1

## 2016-09-08 MED ORDER — POTASSIUM CHLORIDE 10 MEQ/100ML IV SOLN
10.0000 meq | INTRAVENOUS | Status: AC
  Administered 2016-09-08 (×6): 10 meq via INTRAVENOUS
  Filled 2016-09-08 (×6): qty 100

## 2016-09-08 MED ORDER — SODIUM PHOSPHATES 45 MMOLE/15ML IV SOLN
20.0000 mmol | Freq: Once | INTRAVENOUS | Status: AC
  Administered 2016-09-08: 20 mmol via INTRAVENOUS
  Filled 2016-09-08: qty 6.67

## 2016-09-08 MED ORDER — POTASSIUM CHLORIDE CRYS ER 20 MEQ PO TBCR
40.0000 meq | EXTENDED_RELEASE_TABLET | ORAL | Status: AC
  Administered 2016-09-08 (×2): 40 meq via ORAL
  Filled 2016-09-08 (×2): qty 2

## 2016-09-08 MED ORDER — CHOLESTYRAMINE 4 G PO PACK
4.0000 g | PACK | ORAL | Status: DC
  Administered 2016-09-08 – 2016-09-13 (×6): 4 g via ORAL
  Filled 2016-09-08 (×11): qty 1

## 2016-09-08 MED ORDER — DEXAMETHASONE SODIUM PHOSPHATE 4 MG/ML IJ SOLN
4.0000 mg | Freq: Two times a day (BID) | INTRAMUSCULAR | Status: DC
  Administered 2016-09-08 – 2016-09-13 (×11): 4 mg via INTRAVENOUS
  Filled 2016-09-08 (×11): qty 1

## 2016-09-08 MED ORDER — POTASSIUM CHLORIDE CRYS ER 20 MEQ PO TBCR
40.0000 meq | EXTENDED_RELEASE_TABLET | Freq: Once | ORAL | Status: AC
  Administered 2016-09-08: 40 meq via ORAL
  Filled 2016-09-08: qty 2

## 2016-09-08 MED ORDER — BOOST / RESOURCE BREEZE PO LIQD: 1.0000 | Freq: Two times a day (BID) | ORAL | Status: DC

## 2016-09-08 MED ORDER — SODIUM CHLORIDE 0.9 % IV SOLN
2.0000 g | Freq: Once | INTRAVENOUS | Status: AC
  Administered 2016-09-08: 2 g via INTRAVENOUS
  Filled 2016-09-08: qty 20

## 2016-09-08 MED ORDER — MAGNESIUM SULFATE 4 GM/100ML IV SOLN
4.0000 g | Freq: Once | INTRAVENOUS | Status: AC
  Administered 2016-09-08: 4 g via INTRAVENOUS
  Filled 2016-09-08: qty 100

## 2016-09-08 MED ORDER — PRO-STAT SUGAR FREE PO LIQD
30.0000 mL | Freq: Every day | ORAL | Status: DC
  Administered 2016-09-08: 30 mL via ORAL
  Filled 2016-09-08 (×2): qty 30

## 2016-09-08 NOTE — Progress Notes (Signed)
Patient ID: Cindy Byrd, female   DOB: [birth year], 75 y.o.   MRN: [id number]    Progress Note   Subjective   Trying to eat, denies nausea Rectal tube in -liquid stool 1675 cc yesterday    Objective   Vital signs in last 24 hours: Temp:  [97.5 F (36.4 C)-98.7 F (37.1 C)] 98.6 F (37 C) (10/05 0315) Pulse Rate:  [87-106] 100 (10/05 0600) Resp:  [14-28] 28 (10/05 0600) BP: (98-151)/(45-84) 130/79 (10/05 0600) SpO2:  [97 %-100 %] 97 % (10/05 0600) Weight:  [143 lb 11.8 oz (65.2 kg)] 143 lb 11.8 oz (65.2 kg) (10/05 0458) Last BM Date: 09/07/16 General:   Elderly  white female in NAD, less confused  Heart:  Regular rate and rhythm; no murmurs Lungs: Respirations even and unlabored, lungs CTA bilaterally Abdomen:  Soft, nontender and nondistended. Normal bowel sounds. Extremities:  Without edema. Neurologic:  Alert and oriented,  grossly normal neurologically. Psych:  Cooperative. Normal mood and affect.  Intake/Output from previous day: 10/04 0701 - 10/05 0700 In: 6989 [P.O.:360; I.V.:6175; IV Piggyback:454] Out: 7564 [Urine:2275; PPIRJ:1884] Intake/Output this shift: No intake/output data recorded.  Lab Results:  Recent Labs  09/08/16 0335  WBC 2.7*  HGB 9.8*  HCT 28.9*  PLT 206   BMET  Recent Labs  09/06/16 0410 09/07/16 0408 09/07/16 1130 09/08/16 0335  NA 137 137  --  131*  K 4.7 3.4*  --  2.2*  CL 122* 116*  --  103  CO2 11* 14*  --  20*  GLUCOSE 86 72  --  70  BUN 40* 43*  --  34*  CREATININE 2.00* 1.82*  --  1.46*  CALCIUM 5.9* 5.3* 5.2* 4.7*   LFT  Recent Labs  09/08/16 0335  PROT 4.3*  ALBUMIN 1.6*  AST 51*  ALT 42  ALKPHOS 79  BILITOT 1.0   PT/INR No results for input(s): LABPROT, INR in the last 72 hours.  Studies/Results: Ct Head Wo Contrast  Result Date: 09/07/2016 CLINICAL DATA:  Altered mental status, history of esophageal cancer, rule out metastasis EXAM: CT HEAD WITHOUT CONTRAST TECHNIQUE: Contiguous axial images were  obtained from the base of the skull through the vertex without intravenous contrast. COMPARISON:  07/20/2015 CT FINDINGS: Brain: Sulcal prominence over the convexity of the brain consistent with superficial atrophy. No significant central atrophy. No focus of hemorrhage, midline shift or edema. Ventricles are not dilated. No acute infarction. Lack of IV contrast limits assessment for metastatic disease. Vascular: Minimal atherosclerosis of the cavernous internal carotids. Skull: No lytic or blastic disease.  No acute osseous abnormality. Sinuses/Orbits: Bilateral ocular lens surgical change. Mucosal thickening in the sphenoid and posterior ethmoid sinus. No mastoid effusion. Other: None IMPRESSION: Superficial atrophy. No acute intracranial abnormality. No findings of metastatic disease to the brain however lack of IV contrast limits assessment. Electronically Signed   By: Ashley Royalty M.D.   On: 09/07/2016 13:48       Assessment / Plan:    #1  75  yo female with metastatic non small cell lung CA with immune mediated  Colitis  with severe diarrhea  and secondary hypokalemia and hypocalcemia  Infectious workup negative this admit Started Budesonide yesterday  Will add questran 4 gm BID today #2 large bilateral PE's - on Lovenox #3 Barretts esophagus with HGD #4 Pneumonia  #5 AKI- improving   I think it would be helpful to have Oncology /Dr Irene Limbo see pt and offer input regarding prognosis /any  other treatment options for her metastatic cancer  To help guide? palliative care .      Principal Problem:   Bilateral pulmonary embolism (HCC) Active Problems:   Protein calorie malnutrition (HCC)   HTN (hypertension)   Metastatic lung cancer (metastasis from lung to other site) ([place])   Barrett's esophagus determined by biopsy   Nausea with vomiting   Hypokalemia   HCAP (healthcare-associated pneumonia)   ARF (acute renal failure) (HCC)   Pressure injury of skin   Cancer ([place])   Diarrhea    Ulcerative pancolitis without complication ([place])   DVT of lower extremity, bilateral ([place])   Palliative care by specialist   Goals of care, counseling/discussion   Advance care planning   Hypocalcemia   Vitamin D deficiency   Chest pain   New onset a-fib ([place])     LOS: 6 days   Kerby Borner  09/08/2016, 10:11 AM

## 2016-09-08 NOTE — Progress Notes (Addendum)
Daily Progress Note   Patient Name: Cindy Byrd       Date: 09/08/2016 DOB: 02-15-1941  Age: 75 y.o. MRN#: [id number] Attending Physician: Eugenie Filler, MD Primary Care Physician: Tamsen Roers, MD Admit Date: 09/01/2016  Reason for Consultation/Follow-up: Establishing goals of care  Subjective: Much more alert this morning. Oriented x 3. She was not sure how she ended up in the hospital and was surprised at the number of days she had been in the hospital. Per nursing report diarrhea has slowed some. She denies pain, nausea. Notes her mouth and throat are sore. She is requesting something to eat and drink.  Developed A-fib overnight and was started on IV metoprolol.   Review of Systems  Constitutional: Positive for chills and malaise/fatigue.  HENT: Positive for sore throat.   Eyes: Negative.   Respiratory: Negative.   Cardiovascular: Negative.   Gastrointestinal: Negative.   Genitourinary: Negative.   Musculoskeletal: Negative.   Skin: Negative.   Neurological: Positive for weakness.  Endo/Heme/Allergies: Negative.   Psychiatric/Behavioral: Negative.     Length of Stay: 6  Current Medications: Scheduled Meds:  . amLODipine  5 mg Oral Daily  . budesonide  9 mg Oral Daily  . calcium-vitamin D  2 tablet Oral TID  . cefpodoxime  200 mg Oral QHS  . chlorhexidine  15 mL Mouth Rinse BID  . cholestyramine  4 g Oral 2 times per day  . citalopram  20 mg Oral Daily  . dexamethasone  4 mg Intravenous Q12H  . enoxaparin (LOVENOX) injection  60 mg Subcutaneous Q24H  . famotidine (PEPCID) IV  20 mg Intravenous Q24H  . feeding supplement  1 Container Oral BID BM  . feeding supplement (PRO-STAT SUGAR FREE 64)  30 mL Oral Daily  . lactobacillus acidophilus & bulgar  1 tablet Oral  TID WC  . magic mouthwash  5 mL Oral TID  . mouth rinse  15 mL Mouth Rinse q12n4p  . mesalamine  4.8 g Oral Q breakfast  . ondansetron (ZOFRAN) IV  8 mg Intravenous Q8H  . sodium phosphate  Dextrose 5% IVPB  20 mmol Intravenous Once    Continuous Infusions: .  sodium bicarbonate infusion 1/4 NS 1000 mL 200 mL/hr at 09/08/16 1300    PRN Meds: acetaminophen **OR** acetaminophen, diphenoxylate-atropine, hydrALAZINE, LORazepam, oxyCODONE-acetaminophen, promethazine,  sodium chloride flush, zolpidem  Physical Exam  Constitutional: She is oriented to person, place, and time. She appears well-developed.  HENT:  Tongue beefy red  Cardiovascular: Normal rate and regular rhythm.   Pulmonary/Chest: Effort normal and breath sounds normal.  Abdominal: Soft. Bowel sounds are normal.  Rectal tube in place with green liquid output   Musculoskeletal:  Generalized weakness  Neurological: She is alert and oriented to person, place, and time. No cranial nerve deficit.  Skin: Skin is warm and dry. No pallor.            Vital Signs: BP (!) 115/40 (BP Location: Right Arm)   Pulse 91   Temp 98.1 F (36.7 C) (Oral)   Resp (!) 24   Ht '5\' 8"'$  (1.727 m)   Wt 65.2 kg (143 lb 11.8 oz)   SpO2 96%   BMI 21.86 kg/m  SpO2: SpO2: 96 % O2 Device: O2 Device: Not Delivered O2 Flow Rate: O2 Flow Rate (L/min): 2 L/min  Intake/output summary:  Intake/Output Summary (Last 24 hours) at 09/08/16 1407 Last data filed at 09/08/16 1300  Gross per 24 hour  Intake          7713.83 ml  Output             4600 ml  Net          3113.83 ml   LBM: Last BM Date: 09/07/16 Baseline Byrd: Byrd: 60.2 kg (132 lb 11.5 oz) Most recent Byrd: Byrd: 65.2 kg (143 lb 11.8 oz)       Palliative Assessment/Data: PPS: 20 %   Flowsheet Rows   Flowsheet Row Most Recent Value  Intake Tab  Referral Department  Hospitalist  Unit at Time of Referral  Cardiac/Telemetry Unit  Palliative Care Primary Diagnosis  Cancer  Date  Notified  09/06/16  Palliative Care Type  New Palliative care  Reason for referral  Clarify Goals of Care  Date of Admission  09/01/16  # of days IP prior to Palliative referral  5  Clinical Assessment  Psychosocial & Spiritual Assessment  Palliative Care Outcomes      Patient Active Problem List   Diagnosis Date Noted  . Hypocalcemia 09/08/2016  . Vitamin D deficiency 09/08/2016  . New onset a-fib ([place]) 09/08/2016  . Chest pain   . Bilateral pulmonary embolism ([place]) 09/07/2016  . DVT of lower extremity, bilateral ([place]) 09/07/2016  . Palliative care by specialist   . Goals of care, counseling/discussion   . Advance care planning   . Diarrhea   . Ulcerative pancolitis without complication ([place])   . Cancer ([place])   . Pressure injury of skin 09/02/2016  . HCAP (healthcare-associated pneumonia) 09/01/2016  . ARF (acute renal failure) ([place]) 09/01/2016  . Nausea with vomiting 07/18/2016  . Dehydration 07/18/2016  . Hypokalemia 07/18/2016  . Hypoalbuminemia due to protein-calorie malnutrition ([place]) 07/18/2016  . Peripheral edema 07/18/2016  . Diarrhea due to drug 05/19/2016  . Port catheter in place 04/07/2016  . Nicotine addiction 01/19/2016  . Barrett's esophagus determined by biopsy 09/04/2015  . Gastritis   . Metastatic lung cancer (metastasis from lung to other site) ([place]) 08/06/2015  . Esophageal reflux 08/04/2015  . Bone metastasis ([place]) 06/24/2015  . Neoplasm related pain 06/24/2015  . Protein calorie malnutrition ([place]) 06/24/2015  . Anorexia 06/24/2015  . Heavy smoker (more than 20 cigarettes per day) 06/24/2015  . Depression 06/24/2015  . HTN (hypertension) 06/24/2015    Palliative Care Assessment & Plan  Patient Profile: 75 y.o. female  with past medical history of lung cancer with bone mets, colitis Barrett's esophagus, hypertension, admitted on 09/01/2016 with pneumonia. Further workup has also revealed hypokalemia, bilateral PE's, and acute renal failure.       Assessment/Recommendations/Plan   Mental status somewhat improved this morning  New onset A-fib requiring IV control  Thrush- start magic mouthwash for comfort  Spoke with daughter who I think remains unrealistic regarding her mother's recovery. Although she is better in some aspects her overall prognosis remains quite poor with overarching issues of her metastatic cancer and very poor nutritional status. Her albumin is down to 1.6 and this is a high indicator of mortality. This improvement could also be a temporary moment of rallying. Daughter would like to continue current level of care and reevaluate status tomorrow and continue to consider comfort measures.   Goals of Care and Additional Recommendations:  Limitations on Scope of Treatment: No Tracheostomy  Code Status:  DNR  Prognosis:   < 4 weeks d/t severe protein calorie malnutrition, mental status changes, decline in level of functioning.   Discharge Planning:  To Be Determined - if she continues to decline, she may be eligible for residential hospice. If not would recommend Hospice followup either at SNF or home.   Care plan was discussed with daugther- Delia.  Thank you for allowing the Palliative Medicine Team to assist in the care of this patient.   Time In: 0800 Time Out: 0840 Total Time 40 minutes Prolonged Time Billed No      Greater than 50%  of this time was spent counseling and coordinating care related to the above assessment and plan.  Mariana Kaufman, AGNP-C Palliative Medicine   Please contact Palliative Medicine Team phone at [phone] for questions and concerns.

## 2016-09-08 NOTE — Progress Notes (Signed)
Bud for Phosphate Replacement Indication: Hypophosphatemia  Allergies  Allergen Reactions  . Penicillins Other (See Comments)    Unknown; childhood allergy  . Remeron [Mirtazapine] Other (See Comments)    nightmares  . Latex Rash    Patient Measurements: Height: '5\' 8"'$  (172.7 cm) Weight: 143 lb 11.8 oz (65.2 kg) IBW/kg (Calculated) : 63.9  Vital Signs: Temp: 98.1 F (36.7 C) (10/05 0800) Temp Source: Oral (10/05 0800) BP: 95/54 (10/05 1000) Pulse Rate: 78 (10/05 1000) Intake/Output from previous day: 10/04 0701 - 10/05 0700 In: 6989 [P.O.:360; I.V.:6175; IV Piggyback:454] Out: 8588 [Urine:2275; FOYDX:4128] Intake/Output from this shift: No intake/output data recorded.  Labs:  Recent Labs  09/05/16 1426 09/06/16 0410 09/07/16 0408 09/07/16 1130 09/08/16 0335 09/08/16 1030  WBC  --   --   --   --  2.7*  --   HGB  --   --   --   --  9.8*  --   HCT  --   --   --   --  28.9*  --   PLT  --   --   --   --  206  --   CREATININE  --  2.00* 1.82*  --  1.46*  --   LABCREA 210.22  --   --   --   --   --   MG  --   --  1.9  --  1.5*  --   PHOS  --   --   --  1.7*  --  1.3*  ALBUMIN  --   --   --   --  1.6*  --   PROT  --   --   --   --  4.3*  --   AST  --   --   --   --  51*  --   ALT  --   --   --   --  42  --   ALKPHOS  --   --   --   --  79  --   BILITOT  --   --   --   --  1.0  --    Estimated Creatinine Clearance: 33.1 mL/min (by C-G formula based on SCr of 1.46 mg/dL (H)).   Assessment: 16 y/oF presented to ED on 09/01/16 with chest pain. 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 Patient currently not receiving any chemotherapy but is receiving Xgeva for bone mets. Patient known to pharmacy for antibiotic dosing for HCAP and lovenox dosing for PE. Pharmacy now consulted to assist with phosphate replacement. Of  note, patient now with AKI likely secondary to ATN from IV contrast and prerenal azotemia due to volume depletion secondary to high output diarrhea.   Today, 09/08/16: - SCr: 1.42 (improving) - K+: 2.2 - TRH IV and PO replacement - Corr Ca = 6.62 - IV and PO replacement - Na = 131 (dec overnight) - Phos: 1.3 mg/dL - decreased following conservative replacement 10/4  Plan:   Due to aggressive K+ replacement and low Na, give Sodium phosphate 20 mmol IV x 1   Repeat Phos in am   Doreene Eland, PharmD, BCPS.   Pager: [phone] 09/08/2016 11:43 AM

## 2016-09-08 NOTE — Progress Notes (Signed)
Nutrition Follow-up  DOCUMENTATION CODES:   Severe malnutrition in context of acute illness/injury  INTERVENTION:  - Continue to encourage PO intakes. - Will trial 30 mL Prostat once/day mixed in beverage of choice, packet of Prostat will provide 100 kcal and 15 grams of protein. - Continue Boost Breeze but will decrease to BID; continue to encourage intake.  - RD will follow-up on 10/9.  NUTRITION DIAGNOSIS:   Malnutrition (Severe) related to acute illness as evidenced by energy intake < or equal to 50% for > or equal to 5 days, percent weight loss -ongoing  GOAL:   Patient will meet greater than or equal to 90% of their needs -unmet  MONITOR:   PO intake, Supplement acceptance, Labs, I & O's, Skin, Weight trends  ASSESSMENT:   75 y.o. 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   10/5 Reviewed Palliative Care note from meeting with family yesterday afternoon; no nutrition support desired. Pt reports abdominal discomfort and nausea persists this AM and that d/t this she has no appetite/desire for PO. RN in the room states that pt has been taking oral medications without issue. Pt reports taking pills with water and 1 cracker so far.   RN reports daughter stated she was going to bring in a hard boiled egg and drink for pt; encouraged pt to have family bring items she may like. Will continue to monitor for needs. Continue to encourage PO intakes throughout the day. Will trial Prostat once/day mixed in pt's beverage of choice.   Medications reviewed; 2 g IV Ca gluconate x1 dose today, 2 tablets [place] with D TID, Magic mouthwash TID, 4 g IV Mg sulfate x1 dose today, 8 mg IV Zofran TID, 40 mEq oral KCl every 4 hours, PRN oral Phenergan, 5 mmol IV  KPhos x1 dose yesterday.   Labs reviewed; Na: 131 mmol/L, K: 2.2 mmol/L, BUN: 34 mg/dL, creatinine: 1.46 mg/dL, Ca: 4.7 mg/dL, Mg: 1.5 mg/dL, GFR: 34 mL/min.  IVF: 50 mEq sodium bicarb in 1/4 NS @ 125 mL/hr.     10/4 - No meal completion documented since previous assessment. -  Pt reports ongoing, daily nausea and abdominal pain.  - She denies having anything to eat yesterday or this AM and states that she has only been drinking water.  - Palliative Care following pt and plan for meeting scheduled at 1300 today.    10/2 - Pt is receiving infusions of Denosumab. Last infusion was 08/23/2016.  - Attempted to see patient on 9/29, but she did not feel well.  - Pt once again does not feel well today, and was not able to answer many questions.  - She reports UBW is 145 lbs. Per chart, pt has lost 9 kg (14% body weight) over approximately 2 months (since 7/24). This is significant for this time frame.  - She also reports she had poor intake for the past few months, but in the past 7 days she has not had anything to eat.  - She reports poor appetite is secondary to severe nausea and early satiety. Also endorses diarrhea. - Meal Completion: 0-5% per chart. Patient reports the only thing she had yesterday was sips of Sprite. - Unable to complete Nutrition-Focused physical exam as patient refusing at this time. - Discussed plan with RN. When patient tries  to eat or drink anything she has emesis.    Diet Order:  DIET SOFT Room service appropriate? Yes; Fluid consistency: Thin  Skin:  Wound (see comment) (Stage 2 R buttocks and Stage 1 bilateral buttocks pressure injuries)  Last BM:  10/4  Height:   Ht Readings from Last 1 Encounters:  09/01/16 '5\' 8"'$  (1.727 m)    Weight:   Wt Readings from Last 1 Encounters:  09/08/16 143 lb 11.8 oz (65.2 kg)    Ideal Body Weight:  63.64 kg  BMI:  Body mass index is 21.86 kg/m.  Estimated Nutritional Needs:   Kcal:  1700-2000  Protein:  70-85  grams  Fluid:  1.7-2 L/day  EDUCATION NEEDS:   Education needs no appropriate at this time (Review Nausea MNT prior to d/c.)    Jarome Matin, MS, RD, LDN Inpatient Clinical Dietitian Pager # [phone] After hours/weekend pager # [phone]

## 2016-09-08 NOTE — Progress Notes (Signed)
Date:  September 08, 2016 Chart reviewed for concurrent status and case management needs. Will continue to follow the patient for status change: iv h3o3 drip for fliud replacement Discharge Planning: following for needs Expected discharge date: [id number] Velva Harman, BSN, [place], [place]

## 2016-09-08 NOTE — Progress Notes (Signed)
CRITICAL VALUE ALERT  Critical value received:  K:2.2, and Ca:4.7  Date of notification:  09/08/16  Time of notification:  5027  Critical value read back:Yes.    Nurse who received alert:  Jerl Santos RN  MD notified (1st page):  [place] Time of first page:  [phone]  MD notified (2nd page):

## 2016-09-08 NOTE — Progress Notes (Signed)
[facility] Progress Note   Subjective: creat down 1.46.  Better UOP.  No SOB or edema.   Vitals:   09/08/16 0900 09/08/16 1000 09/08/16 1100 09/08/16 1200  BP:  (!) 95/54  (!) 115/40  Pulse: 89 78 80 91  Resp: (!) 22 (!) 21 20 (!) 24  Temp:    98.6 F (37 C)  TempSrc:    Oral  SpO2: 97% 96% 95% 96%  Weight:      Height:        Inpatient medications: . amLODipine  5 mg Oral Daily  . budesonide  9 mg Oral Daily  . calcium-vitamin D  2 tablet Oral TID  . cefpodoxime  200 mg Oral QHS  . chlorhexidine  15 mL Mouth Rinse BID  . cholestyramine  4 g Oral 2 times per day  . citalopram  20 mg Oral Daily  . dexamethasone  4 mg Intravenous Q12H  . enoxaparin (LOVENOX) injection  60 mg Subcutaneous Q24H  . famotidine (PEPCID) IV  20 mg Intravenous Q24H  . feeding supplement  1 Container Oral BID BM  . feeding supplement (PRO-STAT SUGAR FREE 64)  30 mL Oral Daily  . lactobacillus acidophilus & bulgar  1 tablet Oral TID WC  . magic mouthwash  5 mL Oral TID  . mouth rinse  15 mL Mouth Rinse q12n4p  . mesalamine  4.8 g Oral Q breakfast  . ondansetron (ZOFRAN) IV  8 mg Intravenous Q8H  . sodium phosphate  Dextrose 5% IVPB  20 mmol Intravenous Once   .  sodium bicarbonate infusion 1/4 NS 1000 mL 200 mL/hr at 09/08/16 1300   acetaminophen **OR** acetaminophen, diphenoxylate-atropine, hydrALAZINE, LORazepam, oxyCODONE-acetaminophen, promethazine, sodium chloride flush, zolpidem  Exam: Gen thin, tired, ill-appearing, no distress Sclera anicteric, throat moist, tongue red and fissured No jvd or bruits Chest clear bilat RRR no MRG Abd soft mild diffuse tenderness no hsm or ascites GU  defer MS no joint effusions or deformity Ext no LE or UE edema / no wounds or ulcers Neuro is alert, Ox 3 , nf  Hospital meds: Abx > azactam, Maxipime, vanc (9/28- 10/1) Other > norvasc, Celexa, lovenox, pepcid, mesalamine, zofran, protonix, KCl, Cagluc   UA > turbid, few bact, 0-5  wbc/ rbc/ epi, 100 prot Urine Na < 10, UCr 210 9/29 CT angio chest (+ dye) > acute bilat PE w CT evidence of R heart strain. Other... 10/1 CT abd no contrast > stable mets involving L iliac bone w assoc path fracture.  Patchy air space opacities bilat bases.  Fatty liver.   Assessment: 1. Acute kidney injury - ATN from contrast-induced nephropathy + vol depletion.  Resolving, creat down 1.4 today.  No further suggestions, will sign off. 2. Vol depletion - dc hour by hour replacement, ordered IVF's at 100 cc/ hr to hopefully cover daily GI losses.  Adjust prn.  3. Bilat pulm embolus - on IV hep 4. Diarrhea - prob side effect from chemoRx, which is now on hold 5. HTN - controlled, on norvasc 6. Metastatic cancer, unknown primary (lung suspected)  Plan - as above   Kelly Splinter MD [place] pager [phone]   09/08/2016, 3:06 PM    Recent Labs Lab 09/06/16 0410 09/07/16 0408 09/07/16 1130 09/08/16 0335 09/08/16 1030  NA 137 137  --  131*  --   K 4.7 3.4*  --  2.2*  --   CL 122* 116*  --  103  --  CO2 11* 14*  --  20*  --   GLUCOSE 86 72  --  70  --   BUN 40* 43*  --  34*  --   CREATININE 2.00* 1.82*  --  1.46*  --   CALCIUM 5.9* 5.3* 5.2* 4.7*  --   PHOS  --   --  1.7*  --  1.3*    Recent Labs Lab 09/02/16 0415 09/05/16 0435 09/08/16 0335  AST 64* 67* 51*  ALT 89* 77* 42  ALKPHOS 58 64 79  BILITOT 0.5 0.5 1.0  PROT 5.3* 5.8* 4.3*  ALBUMIN 2.3* 2.1* 1.6*    Recent Labs Lab 09/02/16 0415 09/04/16 0426 09/08/16 0335  WBC 11.4* 8.3 2.7*  NEUTROABS 8.8*  --   --   HGB 10.7* 12.2 9.8*  HCT 32.2* 35.8* 28.9*  MCV 84.3 81.2 80.3  PLT 185 293 206   Iron/TIBC/Ferritin/ %Sat No results found for: IRON, TIBC, FERRITIN, IRONPCTSAT  7.

## 2016-09-08 NOTE — Progress Notes (Addendum)
PROGRESS NOTE    Cindy Byrd  RKY:[id number] DOB: 21-Oct-1941 DOA: 09/01/2016 PCP: Tamsen Roers, MD    Brief Narrative:   Cindy Fons Toweryis a 75 y.o.femalewith metastatic non-small cell lung cancer under observation, hypertension, Barrett's esophagus, anemia and colitis secondary to Nivolumabwith persistent diarrhea presents to the ER because of chest pain.  She was found to have bilateral PE and a DVT. Patient also noted to have a pneumonia. She was started on lovenox. Meanwhile her creatinine started worsening , CT ruled out obstructive causes. Renal consulted. GI following.    Assessment & Plan:   Principal Problem:   Bilateral pulmonary embolism (HCC) Active Problems:   HCAP (healthcare-associated pneumonia)   ARF (acute renal failure) (HCC)   Diarrhea   DVT of lower extremity, bilateral (HCC)   Protein calorie malnutrition (HCC)   HTN (hypertension)   Metastatic lung cancer (metastasis from lung to other site) ([place])   Barrett's esophagus determined by biopsy   Nausea with vomiting   Hypokalemia   Pressure injury of skin   Cancer ([place])   Ulcerative pancolitis without complication ([place])   Palliative care by specialist   Goals of care, counseling/discussion   Advance care planning   Hypocalcemia   Vitamin D deficiency   Chest pain   New onset a-fib ([place])  #1 bilateral pulmonary emboli/bilateral DVT Likely secondary to metastatic lung cancer. Patient with some clinical improvement. CT angiogram chest 09/02/2016 with acute bilateral PE with evidence of right heart strain consistent at least with submassive PE. Bilateral lower extremity Dopplers for DVT in the right common femoral vein, right profunda femoris vein, right femoral vein, right popliteal vein, right posterior tibial vein,right peroneal vein, right gastrocnemius vein, left posterior tibial vein, left peroneal vein and left gastrocnemius vein. Patient currently on full dose Lovenox for pharmacy. Will likely  need lifelong anticoagulation with Lovenox. 2-D echo with a EF of 55-60% with no wall motion abnormalities. Right ventricular systolic function was normal. Will need outpatient follow-up with her hematologist/oncologist.  #2 healthcare associated pneumonia Patient is currently afebrile. Some clinical improvement. WBCs normalized. Blood cultures with no growth to date. Sputum Gram stain and culture pending. Continue empiric IV cefepime. Follow.  #3 hypokalemia Likely secondary to GI losses. Replete. Keep magnesium greater than 2.  #4 ARF Likely secondary to ATN from IV contrast from CT angiogram 09/02/2016 and also prerenal azotemia due to volume depletion from GI losses from diarrhea. Creatinine currently at 1.46 from 1.82. Patient has been seen in consultation by nephrology. Continue aggressive IV fluid hydration per nephrology recommendations. Appreciate nephrology input and recommendations.  #5 metabolic acidosis Likely secondary to acute renal failure and diarrhea. On a bicarbonate drip. Improved. Per renal.  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 Patient has been off Nivolumab 3 months have a never took Chad as direct it. Patient with a decrease in amount of stool after being started on budesonide yesterday per GI. Patient with 700 mL over the past 12 hours from 1.5 L October 3 to 09/07/2016.  C. difficile PCR negative. GI pathogen panel negative. CT abdomen and pelvis with grossly stable metastases involving the left iliac bone with associated pathologic fracture. Fluid-filled small and large bowel without significant distention no wall thickening. Mild sigmoid diverticulosis. New patchy airspace opacities of both lung bases. No other evidence of metastatic disease within the abdomen and pelvis. Patient is being followed by GI were recommending probably starting patient on steroids however will defer to  GI. GI following and  appreciate input and recommendations. IV fluids. Supportive care.  #7 hypocalcemia/vitamin D deficiency Questionable etiology. Likely secondary to vitamin D deficiency. Ionized calcium was 5.2. Corrected calcium is 5.47. Magnesium level at 1.5. Vitamin D 25-hydroxy levels at 10.9. We'll give a dose of IV calcium gluconate due to worsening calcium and patient now in new onset A. Fib. Continue oral calcium with vitamin D. Keep magnesium greater than 2. Replete phosphorus. Follow.  #8 new onset atrial fibrillation Likely secondary to electrolyte abnormalities of hypocalcemia, hypomagnesemia, hypophosphatemia and acute illness with bilateral PE, bilateral DVT, progressive diarrhea, acute renal failure. Patient denies any chest pain or shortness of breath. Check a TSH. Check a EKG. 2-D echo with EF of 55-60%, no wall motion abnormalities, no valvular abnormalities 09/02/2016. Will give Lopressor 2.5 mg IV 1 now. Replete electrolytes. Already on anticoagulation to problem #1. Follow.  #9 Barrett's esophagus with high-grade dysplasia Per GI. Patient will likely need to follow-[facility] with [facility].  #10 metastatic non-small cell lung cancer with bilateral lung nodules and large metastatic lesion of the left ilium Patient is to follow-up with oncology, Dr. Irene Limbo as outpatient. Oncology has been notified via [place].  #11 protein calorie malnutrition Nutritional supplementation.    DVT prophylaxis: Full dose Lovenox Code Status: Full Family Communication: Updated patient. No family at bedside. Disposition Plan: Remaining step down unit. When medically stable with resolution of diarrhea, resolution of acute renal failure and clinical improvement likely to skilled nursing facility.   Consultants:   Nephrology: Dr.Schertz 09/06/2016  Gastroenterology: Dr. Silverio Decamp 09/05/2016  Palliative care: Dr. Rowe Pavy 09/07/2016  Procedures:  CT angiogram chest 09/02/2016 CT abdomen and pelvis 09/04/2016 Chest x-ray  09/01/2016 2-D echo 09/02/2016 Lower extremity Dopplers 09/02/2016    Antimicrobials:   IV Azactam 09/01/2016>>> 09/03/2016  IV cefepime 09/03/2016  IV vancomycin 09/01/2016>>>> 09/05/2016   Subjective: Patient denies indigestion.no shortness of breath. No emesis. No nausea. Some improvement with abdominal pain. Decreased stool output after being started on budesonide per GI yesterday. Patient with a stool output of 1675 mL over the past 24 hours and 700 mL over the past 12 hours compared to 1.5 L the day prior to about 12 hours span.   Objective: Vitals:   09/08/16 0400 09/08/16 0458 09/08/16 0500 09/08/16 0600  BP: 100/84   130/79  Pulse: 97  (!) 102 100  Resp: (!) 21  18 (!) 28  Temp:      TempSrc:      SpO2: 97%  97% 97%  Weight:  65.2 kg (143 lb 11.8 oz)    Height:        Intake/Output Summary (Last 24 hours) at 09/08/16 0944 Last data filed at 09/08/16 0607  Gross per 24 hour  Intake          6409.83 ml  Output             3775 ml  Net          2634.83 ml   Filed Weights   09/06/16 0424 09/07/16 0500 09/08/16 0458  Weight: 58.1 kg (128 lb 1.4 oz) 63 kg (138 lb 14.2 oz) 65.2 kg (143 lb 11.8 oz)    Examination:  General exam: Weak. Dry mucous membranes. Respiratory system: Clear to auscultation anterior lung fields. Respiratory effort normal. Cardiovascular system: Irregularly irregular. No JVD, murmurs, rubs, gallops or clicks. No pedal edema. Gastrointestinal system: Abdomen is nondistended, soft and diffusely tender to palpation. No organomegaly or masses felt. Normal bowel sounds  heard. Central nervous system: Alert and oriented. No focal neurological deficits. Extremities: Symmetric 5 x 5 power. Skin: No rashes, lesions or ulcers Psychiatry: Judgement and insight appear normal. Mood & affect appropriate.     Data Reviewed: I have personally reviewed following labs and imaging studies  CBC:  Recent Labs Lab 09/01/16 1808 09/01/16 2235  09/02/16 0415 09/04/16 0426 09/08/16 0335  WBC 12.6* 11.7* 11.4* 8.3 2.7*  NEUTROABS  --   --  8.8*  --   --   HGB 11.6* 10.9* 10.7* 12.2 9.8*  HCT 35.2* 33.1* 32.2* 35.8* 28.9*  MCV 84.2 84.0 84.3 81.2 80.3  PLT 173 165 185 293 606   Basic Metabolic Panel:  Recent Labs Lab 09/01/16 1808 09/01/16 2235  09/04/16 0426 09/04/16 1030 09/05/16 0435 09/06/16 0410 09/07/16 0408 09/07/16 1130 09/08/16 0335  NA 134*  --   < > 139  --  137 137 137  --  131*  K 2.5*  --   < > 2.6*  --  4.0 4.7 3.4*  --  2.2*  CL 106  --   < > 113*  --  116* 122* 116*  --  103  CO2 18*  --   < > 16*  --  14* 11* 14*  --  20*  GLUCOSE 88  --   < > 130*  --  120* 86 72  --  70  BUN 33*  --   < > 22*  --  35* 40* 43*  --  34*  CREATININE 1.48* 1.21*  < > 1.50*  --  1.84* 2.00* 1.82*  --  1.46*  CALCIUM 7.6*  --   < > 6.9*  --  6.6* 5.9* 5.3* 5.2* 4.7*  MG 1.3* 2.3  --   --  1.7  --   --  1.9  --  1.5*  PHOS  --   --   --   --   --   --   --   --  1.7*  --   < > = values in this interval not displayed. GFR: Estimated Creatinine Clearance: 33.1 mL/min (by C-G formula based on SCr of 1.46 mg/dL (H)). Liver Function Tests:  Recent Labs Lab 09/01/16 1808 09/02/16 0415 09/05/16 0435 09/08/16 0335  AST 81* 64* 67* 51*  ALT 102* 89* 77* 42  ALKPHOS 63 58 64 79  BILITOT 0.9 0.5 0.5 1.0  PROT 6.0* 5.3* 5.8* 4.3*  ALBUMIN 2.6* 2.3* 2.1* 1.6*    Recent Labs Lab 09/01/16 1808 09/05/16 0435  LIPASE 26 22   No results for input(s): AMMONIA in the last 168 hours. Coagulation Profile:  Recent Labs Lab 09/02/16 1030  INR 1.44   Cardiac Enzymes:  Recent Labs Lab 09/01/16 2235 09/02/16 0415 09/02/16 1030  TROPONINI <0.03 <0.03 <0.03   BNP (last 3 results) No results for input(s): PROBNP in the last 8760 hours. HbA1C: No results for input(s): HGBA1C in the last 72 hours. CBG: No results for input(s): GLUCAP in the last 168 hours. Lipid Profile: No results for input(s): CHOL, HDL,  LDLCALC, TRIG, CHOLHDL, LDLDIRECT in the last 72 hours. Thyroid Function Tests: No results for input(s): TSH, T4TOTAL, FREET4, T3FREE, THYROIDAB in the last 72 hours. Anemia Panel: No results for input(s): VITAMINB12, FOLATE, FERRITIN, TIBC, IRON, RETICCTPCT in the last 72 hours. Sepsis Labs: No results for input(s): PROCALCITON, LATICACIDVEN in the last 168 hours.  Recent Results (from the past 240 hour(s))  C difficile quick scan  w PCR reflex     Status: None   Collection Time: 09/02/16  7:15 PM  Result Value Ref Range Status   C Diff antigen NEGATIVE NEGATIVE Final   C Diff toxin NEGATIVE NEGATIVE Final   C Diff interpretation No C. difficile detected.  Final  Culture, blood (Routine X 2) w Reflex to ID Panel     Status: None (Preliminary result)   Collection Time: 09/03/16  6:05 PM  Result Value Ref Range Status   Specimen Description BLOOD RIGHT ARM  Final   Special Requests BOTTLES DRAWN AEROBIC AND ANAEROBIC 5CC  Final   Culture   Final    NO GROWTH 3 DAYS Performed at [facility]    Report Status PENDING  Incomplete  Culture, blood (Routine X 2) w Reflex to ID Panel     Status: None (Preliminary result)   Collection Time: 09/03/16  6:11 PM  Result Value Ref Range Status   Specimen Description BLOOD RIGHT ARM  Final   Special Requests BOTTLES DRAWN AEROBIC ONLY 5CC  Final   Culture   Final    NO GROWTH 3 DAYS Performed at [facility]    Report Status PENDING  Incomplete  Gastrointestinal Panel by PCR , Stool     Status: None   Collection Time: 09/06/16 10:44 AM  Result Value Ref Range Status   Campylobacter species NOT DETECTED NOT DETECTED Final   Plesimonas shigelloides NOT DETECTED NOT DETECTED Final   Salmonella species NOT DETECTED NOT DETECTED Final   Yersinia enterocolitica NOT DETECTED NOT DETECTED Final   Vibrio species NOT DETECTED NOT DETECTED Final   Vibrio cholerae NOT DETECTED NOT DETECTED Final   Enteroaggregative E coli (EAEC) NOT  DETECTED NOT DETECTED Final   Enteropathogenic E coli (EPEC) NOT DETECTED NOT DETECTED Final   Enterotoxigenic E coli (ETEC) NOT DETECTED NOT DETECTED Final   Shiga like toxin producing E coli (STEC) NOT DETECTED NOT DETECTED Final   Shigella/Enteroinvasive E coli (EIEC) NOT DETECTED NOT DETECTED Final   Cryptosporidium NOT DETECTED NOT DETECTED Final   Cyclospora cayetanensis NOT DETECTED NOT DETECTED Final   Entamoeba histolytica NOT DETECTED NOT DETECTED Final   Giardia lamblia NOT DETECTED NOT DETECTED Final   Adenovirus F40/41 NOT DETECTED NOT DETECTED Final   Astrovirus NOT DETECTED NOT DETECTED Final   Norovirus GI/GII NOT DETECTED NOT DETECTED Final   Rotavirus A NOT DETECTED NOT DETECTED Final   Sapovirus (I, II, IV, and V) NOT DETECTED NOT DETECTED Final         Radiology Studies: Ct Head Wo Contrast  Result Date: 09/07/2016 CLINICAL DATA:  Altered mental status, history of esophageal cancer, rule out metastasis EXAM: CT HEAD WITHOUT CONTRAST TECHNIQUE: Contiguous axial images were obtained from the base of the skull through the vertex without intravenous contrast. COMPARISON:  07/20/2015 CT FINDINGS: Brain: Sulcal prominence over the convexity of the brain consistent with superficial atrophy. No significant central atrophy. No focus of hemorrhage, midline shift or edema. Ventricles are not dilated. No acute infarction. Lack of IV contrast limits assessment for metastatic disease. Vascular: Minimal atherosclerosis of the cavernous internal carotids. Skull: No lytic or blastic disease.  No acute osseous abnormality. Sinuses/Orbits: Bilateral ocular lens surgical change. Mucosal thickening in the sphenoid and posterior ethmoid sinus. No mastoid effusion. Other: None IMPRESSION: Superficial atrophy. No acute intracranial abnormality. No findings of metastatic disease to the brain however lack of IV contrast limits assessment. Electronically Signed   By: Ashley Royalty  M.D.   On: 09/07/2016  13:48        Scheduled Meds: . amLODipine  5 mg Oral Daily  . budesonide  9 mg Oral Daily  . calcium gluconate  2 g Intravenous Once  . calcium-vitamin D  2 tablet Oral TID  . ceFEPime (MAXIPIME) IV  1 g Intravenous Q24H  . chlorhexidine  15 mL Mouth Rinse BID  . citalopram  20 mg Oral Daily  . enoxaparin (LOVENOX) injection  60 mg Subcutaneous Q24H  . famotidine (PEPCID) IV  20 mg Intravenous Q24H  . feeding supplement  1 Container Oral TID BM  . lactobacillus acidophilus & bulgar  1 tablet Oral TID WC  . magic mouthwash  5 mL Oral TID  . magnesium sulfate 1 - 4 g bolus IVPB  4 g Intravenous Once  . mouth rinse  15 mL Mouth Rinse q12n4p  . mesalamine  4.8 g Oral Q breakfast  . ondansetron (ZOFRAN) IV  8 mg Intravenous Q8H  . potassium chloride  10 mEq Intravenous Q1 Hr x 6  . potassium chloride  40 mEq Oral Q4H   Continuous Infusions: .  sodium bicarbonate infusion 1/4 NS 1000 mL 325 mL/hr at 09/08/16 0647     LOS: 6 days    Time spent: 45 mins    Anetta Olvera, MD [facility] Pager [phone]  If 7PM-7AM, please contact night-coverage [email or web address] Password [email or web address] 09/08/2016, 9:44 AM

## 2016-09-08 NOTE — Progress Notes (Signed)
ANTICOAGULATION CONSULT NOTE - Follow up [place] for Lovenox Indication: pulmonary embolus  Allergies  Allergen Reactions  . Penicillins Other (See Comments)    Unknown; childhood allergy  . Remeron [Mirtazapine] Other (See Comments)    nightmares  . Latex Rash    Patient Measurements: Height: '5\' 8"'$  (172.7 cm) Weight: 143 lb 11.8 oz (65.2 kg) IBW/kg (Calculated) : 63.9  Vital Signs: Temp: 98.6 F (37 C) (10/05 0315) Temp Source: Oral (10/05 0315) BP: 130/79 (10/05 0600) Pulse Rate: 100 (10/05 0600)  Labs:  Recent Labs  09/06/16 0410 09/07/16 0408 09/08/16 0335  HGB  --   --  9.8*  HCT  --   --  28.9*  PLT  --   --  206  CREATININE 2.00* 1.82* 1.46*    Estimated Creatinine Clearance: 33.1 mL/min (by C-G formula based on SCr of 1.46 mg/dL (H)).   Infusions:  .  sodium bicarbonate infusion 1/4 NS 1000 mL 325 mL/hr at 09/08/16 5749    Assessment: 75 yo female presented to ER with LUQ pain and diarrhea. 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 Patient currently not receiving any chemotherapy but is receiving Xgeva for bone mets.  She is known to pharmacy for antibiotic consults for possible PNA. CT now shows acute bilateral PE.  Pharmacy is consulted to dose Lovenox.  Prophylactic Lovenox '40mg'$  started on admission; last given SQ on 9/28 at ~2300.  Today, 09/08/2016:  CBC:  Hgb low/stable, Plt remain WNL  SCr improving, CrCl ~ 33 ml/min  No bleeding or complications reported.  Goal of Therapy:  Anti-Xa level 0.6-1 units/ml 4hrs after LMWH dose given Monitor platelets by anticoagulation protocol: Yes   Plan:   Continue Lovenox 60 mg SQ q24h today and re-evaluate renal function in morning, if renal function stable or continuing to improve then change to q12h dosing.    Monitor CBC, renal function  Doreene Eland, PharmD, BCPS.    Pager: [phone] 09/08/2016 10:22 AM

## 2016-09-08 NOTE — Progress Notes (Signed)
PT Cancellation Note  Patient Details Name: Cindy Byrd MRN: [id number] DOB: [birth year]   Cancelled Treatment:    Reason Eval/Treat Not Completed: Fatigue/lethargy limiting ability to participate Pt had just been OOB to recliner then back to bed with nursing and too fatigued to again mobilize.  Pt politely requests we check back on her another day.   Linton Stolp,KATHrine E 09/08/2016, 3:47 PM Carmelia Bake, PT, DPT 09/08/2016 Pager: [phone]

## 2016-09-09 DIAGNOSIS — I4891 Unspecified atrial fibrillation: Secondary | ICD-10-CM

## 2016-09-09 DIAGNOSIS — E43 Unspecified severe protein-calorie malnutrition: Secondary | ICD-10-CM

## 2016-09-09 LAB — BASIC METABOLIC PANEL
ANION GAP: 12 (ref 5–15)
ANION GAP: 10 (ref 5–15)
BUN: 21 mg/dL — ABNORMAL HIGH (ref 6–20)
BUN: 19 mg/dL (ref 6–20)
CHLORIDE: 101 mmol/L (ref 101–111)
CHLORIDE: 101 mmol/L (ref 101–111)
CO2: 20 mmol/L — AB (ref 22–32)
CO2: 22 mmol/L (ref 22–32)
Calcium: 5 mg/dL — CL (ref 8.9–10.3)
Calcium: 4.9 mg/dL — CL (ref 8.9–10.3)
Creatinine, Ser: 1.14 mg/dL — ABNORMAL HIGH (ref 0.44–1.00)
Creatinine, Ser: 1.01 mg/dL — ABNORMAL HIGH (ref 0.44–1.00)
GFR calc non Af Amer: 46 mL/min — ABNORMAL LOW (ref >60)
GFR calc non Af Amer: 53 mL/min — ABNORMAL LOW (ref >60)
GFR, EST AFRICAN AMERICAN: 53 mL/min — AB (ref >60)
GLUCOSE: 104 mg/dL — AB (ref 65–99)
Glucose, Bld: 97 mg/dL (ref 65–99)
Potassium: 2.9 mmol/L — ABNORMAL LOW (ref 3.5–5.1)
Potassium: 2.6 mmol/L — CL (ref 3.5–5.1)
Sodium: 133 mmol/L — ABNORMAL LOW (ref 135–145)
Sodium: 133 mmol/L — ABNORMAL LOW (ref 135–145)

## 2016-09-09 LAB — CULTURE, BLOOD (ROUTINE X 2)
CULTURE: NO GROWTH
Culture: NO GROWTH

## 2016-09-09 LAB — CBC
HCT: 27.8 % — ABNORMAL LOW (ref 36.0–46.0)
Hemoglobin: 9.6 g/dL — ABNORMAL LOW (ref 12.0–15.0)
MCH: 27.5 pg (ref 26.0–34.0)
MCHC: 34.5 g/dL (ref 30.0–36.0)
MCV: 79.7 fL (ref 78.0–100.0)
PLATELETS: 229 10*3/uL (ref 150–400)
RBC: 3.49 MIL/uL — AB (ref 3.87–5.11)
RDW: 15.5 % (ref 11.5–15.5)
WBC: 1.4 10*3/uL — AB (ref 4.0–10.5)

## 2016-09-09 LAB — MAGNESIUM: Magnesium: 2.2 mg/dL (ref 1.7–2.4)

## 2016-09-09 LAB — PANCREATIC ELASTASE, FECAL: PANCREATIC ELASTASE-1, STL: 246 ug Elast./g (ref >200)

## 2016-09-09 LAB — PHOSPHORUS: PHOSPHORUS: 3 mg/dL (ref 2.5–4.6)

## 2016-09-09 MED ORDER — FAMOTIDINE 20 MG PO TABS
20.0000 mg | ORAL_TABLET | Freq: Every day | ORAL | Status: DC
  Administered 2016-09-09 – 2016-09-13 (×5): 20 mg via ORAL
  Filled 2016-09-09 (×5): qty 1

## 2016-09-09 MED ORDER — ENOXAPARIN SODIUM 60 MG/0.6ML ~~LOC~~ SOLN
60.0000 mg | Freq: Two times a day (BID) | SUBCUTANEOUS | Status: DC
  Administered 2016-09-09 – 2016-09-13 (×9): 60 mg via SUBCUTANEOUS
  Filled 2016-09-09 (×9): qty 0.6

## 2016-09-09 MED ORDER — SODIUM CHLORIDE 0.9 % IV SOLN
8.0000 mg | Freq: Four times a day (QID) | INTRAVENOUS | Status: DC | PRN
  Filled 2016-09-09: qty 4

## 2016-09-09 MED ORDER — PEDIASURE PEPTIDE 1.0 CAL PO LIQD
237.0000 mL | Freq: Three times a day (TID) | ORAL | Status: DC
  Administered 2016-09-09 – 2016-09-13 (×7): 237 mL via ORAL

## 2016-09-09 MED ORDER — PEDIALYTE PO SOLN: 240.0000 mL | Freq: Three times a day (TID) | ORAL | Status: DC

## 2016-09-09 MED ORDER — SODIUM CHLORIDE 0.9% FLUSH
10.0000 mL | INTRAVENOUS | Status: DC | PRN
  Administered 2016-09-13 (×2): 10 mL
  Filled 2016-09-09 (×2): qty 40

## 2016-09-09 MED ORDER — PEDIASURE PEPTIDE 1.0 CAL PO LIQD
237.0000 mL | Freq: Three times a day (TID) | ORAL | Status: DC
  Filled 2016-09-09: qty 237

## 2016-09-09 MED ORDER — POTASSIUM CHLORIDE CRYS ER 20 MEQ PO TBCR
40.0000 meq | EXTENDED_RELEASE_TABLET | ORAL | Status: AC
  Administered 2016-09-09 (×2): 40 meq via ORAL
  Filled 2016-09-09 (×2): qty 2

## 2016-09-09 MED ORDER — SODIUM CHLORIDE 0.9 % IV SOLN
2.0000 g | Freq: Once | INTRAVENOUS | Status: AC
  Administered 2016-09-09: 2 g via INTRAVENOUS
  Filled 2016-09-09 (×3): qty 20

## 2016-09-09 MED ORDER — LOPERAMIDE HCL 2 MG PO CAPS
2.0000 mg | ORAL_CAPSULE | Freq: Two times a day (BID) | ORAL | Status: DC | PRN
  Administered 2016-09-09 – 2016-09-11 (×4): 2 mg via ORAL
  Filled 2016-09-09 (×4): qty 1

## 2016-09-09 MED ORDER — POTASSIUM CHLORIDE CRYS ER 20 MEQ PO TBCR
40.0000 meq | EXTENDED_RELEASE_TABLET | ORAL | Status: AC
  Administered 2016-09-09: 40 meq via ORAL
  Filled 2016-09-09: qty 2

## 2016-09-09 MED ORDER — SODIUM CHLORIDE 0.9% FLUSH: 10.0000 mL | Freq: Two times a day (BID) | INTRAVENOUS | Status: DC

## 2016-09-09 NOTE — Progress Notes (Signed)
[name]   HEMATOLOGY/ONCOLOGY INPATIENT PROGRESS NOTE  Date of Service: 09/09/2016  Inpatient Attending: .Eugenie Filler, MD   SUBJECTIVE  Ms Whitener was seen with her daughter at bedside. Diarrhea appears much improved with steroids. Improving po intake. Being transferred out of ICU today. Encouraged to optimize po intake and start working with therapies. No chest pain or overt shortness of breath at this time.   OBJECTIVE:  Mildly distressed from diarrhea  PHYSICAL EXAMINATION: . Vitals:   09/09/16 1200 09/09/16 1300 09/09/16 1400 09/09/16 2153  BP: (!) 133/114   133/65  Pulse: 87 93 85 79  Resp: '19 12  18  '$ Temp:   98.4 F (36.9 C) 98.7 F (37.1 C)  TempSrc:   Oral Oral  SpO2: 97% 97% 99% 97%  Weight:   145 lb 11.2 oz (66.1 kg) 139 lb 8.8 oz (63.3 kg)  Height:   '5\' 8"'$  (1.727 m)    Filed Weights   09/07/16 0500 09/08/16 0458 09/09/16 1400  Weight: 138 lb 14.2 oz (63 kg) 143 lb 11.8 oz (65.2 kg) 145 lb 11.2 oz (66.1 kg)   .Body mass index is 22.15 kg/m.  GENERAL:fatigued appearing, not on oxygen, no pain SKIN: no acute rashes EYES: normal, conjunctiva are pink and non-injected, sclera clear OROPHARYNX:mucous membranes dry NECK: supple, no JVD, thyroid normal size, non-tender, without nodularity LYMPH:  no palpable lymphadenopathy in the cervical, axillary or inguinal LUNGS:coarse breath sounds, few basal rales HEART: b/l pedal edema ABDOMEN: abdomen midly tender, no rigidity or rebound PSYCH: anxious/fatigue apeparing NEURO: no focal motor/sensory deficits  MEDICAL HISTORY:  Past Medical History:  Diagnosis Date  . Barrett's esophagus   . Bone neoplasm 06/24/2015  . [facility])    metastatic poorly differentiated carcinoma. tumor left groin surgical removal with radiation tx.  . Cataract    BILATERAL  . Cigarette smoker two packs a day or less    Currently still smoking 2 PPD - Not interested in quitting at this time.  . Colitis 2017  . Colon polyps    hyperplastic, tubular adenomas, tubulovillous adenoma  . Cough, persistent    hx. lung cancer ? primary-being evaluated, unsure of primary site.  . Depression 06/24/2015  . Diverticulosis   . Emphysema of lung ([place])   . Endometriosis    Hysterectomy with BSO at age 59 yrs  . Esophageal adenocarcinoma ([place]) 08/11/15   intramucosal  . Gastritis   . GERD (gastroesophageal reflux disease)   . H/O: pneumonia   . Heavy smoker (more than 20 cigarettes per day) 06/24/2015  . Hiatal hernia   . Hyperlipidemia   . Hypertension 06/24/2015   likely improved incidental to 40 lbs weight loss from her neoplasm. No Longer taking med for this as of 08-06-15  . IBS (irritable bowel syndrome)   . Pain    left hip-persistent"tumor of bone"-radiation tx. 10.  . Pulmonary nodules   . Swelling of ankle    bilateral  . Vitamin D deficiency disease     SURGICAL HISTORY: Past Surgical History:  Procedure Laterality Date  . ABDOMINAL HYSTERECTOMY    . BARTHOLIN GLAND CYST EXCISION  75 yo ago   Does not want if it was an infected cyst or tumor. Was soon as delivery  . COLONOSCOPY W/ POLYPECTOMY     multiple times - last done 09/2014 per patient.  . ESOPHAGOGASTRODUODENOSCOPY (EGD) WITH PROPOFOL N/A 08/11/2015   Procedure: ESOPHAGOGASTRODUODENOSCOPY (EGD) WITH PROPOFOL;  Surgeon: [name], MD;  Location: [facility];  Service: Gastroenterology;  Laterality: N/A;  . GANGLION CYST EXCISION    . KNEE ARTHROSCOPY  age about 32 yrs  . TONSILLECTOMY    . TOTAL ABDOMINAL HYSTERECTOMY W/ BILATERAL SALPINGOOPHORECTOMY  at age 56 yrs   For endometriosis    SOCIAL HISTORY: Social History   Social History  . Marital status: Widowed    Spouse name: N/A  . Number of children: 2  . Years of education: N/A   Occupational History  . Not on file.   Social History Main Topics  . Smoking status: Former Smoker    Packs/day: 1.00    Years: 60.00    Types: Cigarettes    Quit date: 12/06/2015  . Smokeless tobacco:  Never Used  . Alcohol use No  . Drug use: No  . Sexual activity: No   Other Topics Concern  . Not on file   Social History Narrative  . No narrative on file    FAMILY HISTORY: Family History  Problem Relation Age of Onset  . Colon cancer Brother   . Colon cancer Brother   . Stroke Mother   . Colon cancer Father   . Breast cancer Daughter 61    ER/PR+ stage II    ALLERGIES:  is allergic to penicillins; remeron [mirtazapine]; and latex.  MEDICATIONS:  Scheduled Meds: . amLODipine  5 mg Oral Daily  . budesonide  9 mg Oral Daily  . calcium-vitamin D  2 tablet Oral TID  . chlorhexidine  15 mL Mouth Rinse BID  . cholestyramine  4 g Oral 2 times per day  . citalopram  20 mg Oral Daily  . dexamethasone  4 mg Intravenous Q12H  . enoxaparin (LOVENOX) injection  60 mg Subcutaneous Q12H  . famotidine  20 mg Oral Daily  . feeding supplement (PEDIASURE PEPTIDE 1.0 CAL)  237 mL Oral TID BM  . lactobacillus acidophilus & bulgar  1 tablet Oral TID WC  . magic mouthwash  5 mL Oral TID  . mouth rinse  15 mL Mouth Rinse q12n4p  . mesalamine  4.8 g Oral Q breakfast  . sodium chloride flush  10-40 mL Intracatheter Q12H   Continuous Infusions: .  sodium bicarbonate infusion 1/4 NS 1000 mL 100 mL/hr at 09/09/16 1446   PRN Meds:.acetaminophen **OR** acetaminophen, diphenoxylate-atropine, hydrALAZINE, loperamide, LORazepam, [DISCONTINUED] ondansetron **OR** ondansetron (ZOFRAN) IV, oxyCODONE-acetaminophen, promethazine, sodium chloride flush, sodium chloride flush, zolpidem  REVIEW OF SYSTEMS:    10 Point review of Systems was done is negative except as noted above.   LABORATORY DATA:  I have reviewed the data as listed  . CBC Latest Ref Rng & Units 09/09/2016 09/08/2016 09/04/2016  WBC 4.0 - 10.5 K/uL 1.4(LL) 2.7(L) 8.3  Hemoglobin 12.0 - 15.0 g/dL 9.6(L) 9.8(L) 12.2  Hematocrit 36.0 - 46.0 % 27.8(L) 28.9(L) 35.8(L)  Platelets 150 - 400 K/uL 229 206 293    . CMP Latest Ref Rng &  Units 09/09/2016 09/09/2016 09/08/2016  Glucose 65 - 99 mg/dL 104(H) 97 70  BUN 6 - 20 mg/dL 19 21(H) 34(H)  Creatinine 0.44 - 1.00 mg/dL 1.01(H) 1.14(H) 1.46(H)  Sodium 135 - 145 mmol/L 133(L) 133(L) 131(L)  Potassium 3.5 - 5.1 mmol/L 2.6(LL) 2.9(L) 2.2(LL)  Chloride 101 - 111 mmol/L 101 101 103  CO2 22 - 32 mmol/L 22 20(L) 20(L)  Calcium 8.9 - 10.3 mg/dL 4.9(LL) 5.0(LL) 4.7(LL)  Total Protein 6.5 - 8.1 g/dL - - 4.3(L)  Total Bilirubin 0.3 - 1.2 mg/dL - - 1.0  Alkaline Phos 38 - 126 U/L - - 79  AST 15 - 41 U/L - - 51(H)  ALT 14 - 54 U/L - - 42     RADIOGRAPHIC STUDIES: I have personally reviewed the radiological images as listed and agreed with the findings in the report. Ct Abdomen Pelvis Wo Contrast  Result Date: 09/04/2016 CLINICAL DATA:  Metastatic non-small-cell lung cancer under observation. History of Barrett's esophagus, anemia and colitis. History of esophageal adenocarcinoma. EXAM: CT ABDOMEN AND PELVIS WITHOUT CONTRAST TECHNIQUE: Multidetector CT imaging of the abdomen and pelvis was performed following the standard protocol without IV contrast. COMPARISON:  CT 06/24/2016 and 04/19/2016. FINDINGS: Lower chest: New patchy dependent airspace opacities in both lower lobes. There is no significant pleural or pericardial effusion. Hepatobiliary: There is decreased hepatic density consistent with steatosis. Multiple low-density hepatic lesions are grossly stable, likely cysts. No new or enlarging lesions are identified. The gallbladder is distended without wall thickening. No evidence of gallstones, surrounding inflammation or biliary dilatation. Pancreas: Unremarkable. No pancreatic ductal dilatation or surrounding inflammatory changes. Spleen: Normal in size without focal abnormality. Adrenals/Urinary Tract: Both adrenal glands appear normal. Both kidneys appear normal. There is no evidence of renal mass, hydronephrosis or urinary tract calculus. Left ovarian vein phleboliths are grossly  stable. There is high-density material within the urinary bladder. No focal urothelial lesions are seen. Stomach/Bowel: The stomach is decompressed. There is fluid throughout the small bowel and colon. No bowel wall thickening or surrounding inflammatory change identified. The appendix appears normal. There are mild diverticular changes of the sigmoid colon. Rectal tube in place. Vascular/Lymphatic: There are no enlarged abdominal or pelvic lymph nodes. Aortic and branch vessel atherosclerosis. Reproductive: Hysterectomy.  No evidence of adnexal mass. Other: No ascites, peritoneal nodularity or free air. There is soft tissue stranding and subcutaneous air within the subcutaneous fat of the left anterior abdominal wall, presumably from subcutaneous injections. Musculoskeletal: Grossly stable destructive mass involving the left iliac bone with associated pathologic fracture. No new osseous lesions or new fractures are seen. IMPRESSION: 1. Grossly stable metastasis involving the left iliac bone with associated pathologic fracture. 2. No other evidence of metastatic disease within the abdomen or pelvis. 3. New patchy airspace opacities at both lung bases may reflect atelectasis or aspiration. Correlate clinically. 4. Fluid-filled small and large bowel without significant distention or wall thickening. Mild sigmoid diverticulosis. Electronically Signed   By: Richardean Sale M.D.   On: 09/04/2016 17:34   Dg Chest 2 View  Result Date: 09/01/2016 CLINICAL DATA:  Left lower chest abdominal pain beginning today. Dyspnea. Metastatic lung carcinoma. EXAM: CHEST  2 VIEW COMPARISON:  10/03/2014 FINDINGS: The heart size and mediastinal contours remain within normal limits. Aortic atherosclerosis. Right-sided power port remains in appropriate position. New airspace opacity is seen in the left lower lobe, suspicious for pneumonia. There is new linear opacity in the right lower lung, suspicious for atelectasis or scarring. No  evidence of pneumothorax or pleural effusion. IMPRESSION: New airspace opacity in left lower lobe, suspicious for pneumonia. New right lower lung atelectasis versus scarring. Electronically Signed   By: Earle Gell M.D.   On: 09/01/2016 17:53   Ct Head Wo Contrast  Result Date: 09/07/2016 CLINICAL DATA:  Altered mental status, history of esophageal cancer, rule out metastasis EXAM: CT HEAD WITHOUT CONTRAST TECHNIQUE: Contiguous axial images were obtained from the base of the skull through the vertex without intravenous contrast. COMPARISON:  07/20/2015 CT FINDINGS: Brain: Sulcal prominence over the convexity of the brain  consistent with superficial atrophy. No significant central atrophy. No focus of hemorrhage, midline shift or edema. Ventricles are not dilated. No acute infarction. Lack of IV contrast limits assessment for metastatic disease. Vascular: Minimal atherosclerosis of the cavernous internal carotids. Skull: No lytic or blastic disease.  No acute osseous abnormality. Sinuses/Orbits: Bilateral ocular lens surgical change. Mucosal thickening in the sphenoid and posterior ethmoid sinus. No mastoid effusion. Other: None IMPRESSION: Superficial atrophy. No acute intracranial abnormality. No findings of metastatic disease to the brain however lack of IV contrast limits assessment. Electronically Signed   By: Ashley Royalty M.D.   On: 09/07/2016 13:48   Ct Angio Chest Pe W Or Wo Contrast  Result Date: 09/02/2016 CLINICAL DATA:  Pt with metastatic non-small cell lung cancer under observation, hypertension, Barrett's esophagus, anemia and colitis secondary to Nivolumab with persistent diarrhea, presents to the ER because of chest pain. EXAM: CT ANGIOGRAPHY CHEST WITH CONTRAST TECHNIQUE: Multidetector CT imaging of the chest was performed using the standard protocol during bolus administration of intravenous contrast. Multiplanar CT image reconstructions and MIPs were obtained to evaluate the vascular  anatomy. CONTRAST:  100 mL Isovue 370 COMPARISON:  CT chest 06/24/2016, 04/19/2016, 01/14/2016 FINDINGS: Cardiovascular: Satisfactory opacification of the pulmonary arteries to the segmental level. Occlusive pulmonary embolus in the left lower lobe pulmonary artery. Pulmonary embolus in the right lower lobe segmental pulmonary artery. Normal heart size. No pericardial effusion. Thoracic aortic atherosclerosis. Mediastinum/Nodes: No enlarged mediastinal, hilar, or axillary lymph nodes. Thyroid gland, trachea, and esophagus demonstrate no significant findings. Lungs/Pleura: Small left pleural effusion. Right basilar airspace disease which may reflect atelectasis versus pneumonia versus pulmonary infarction. Left basilar disease which may reflect atelectasis versus pneumonia versus less likely developing pulmonary infarction. Bilateral centrilobular emphysema. Upper Abdomen: No acute upper abdominal abnormality. Small hiatal hernia. Musculoskeletal: No acute osseous abnormality. No aggressive lytic or sclerotic osseous lesion Review of the MIP images confirms the above findings. IMPRESSION: 1. Acute bilateral pulmonary emboli as described above. Positive for acute PE with CT evidence of right heart strain (RV/LV Ratio = 1.07) consistent with at least submassive (intermediate risk) PE. The presence of right heart strain has been associated with an increased risk of morbidity and mortality. Please activate Code PE by paging [phone]. 2. Right basilar airspace disease which may reflect atelectasis versus pneumonia versus pulmonary infarction. Left basilar disease which may reflect atelectasis versus pneumonia versus less likely developing pulmonary infarction. 3.  Aortic Atherosclerosis (ICD10-170.0) 4.  Emphysema. ([id number]) Critical Value/emergent results were called by telephone at the time of interpretation on 09/02/2016 at 10:06 am to Dr. Karleen Hampshire, who verbally acknowledged these results. Electronically Signed    By: Kathreen Devoid   On: 09/02/2016 10:07    ASSESSMENT & PLAN:   75 year old with  #1 metastatic non-small cell lung cancer with bilateral lung nodules and large metastatic lesion of the left Ilium. Recent CT of the head on 09/07/2016 negative for brain metastases CT abdomen and pelvis 09/04/2016 stable metastasis involving the left iliac bone. No other metastatic disease within the abdomen and pelvis. CT of the chest 09/02/2016 - acute bilateral pulmonary emboli. Right basilar pneumonia versus infarction. No overt evidence of progressive metastatic malignancy. Plan -From lung cancer standpoint the patient has been on Active surveillance only with no systemic therapies for several months. -She has been on monthly Xgeva for her bone metastases. This could attribute to hypocalcemia. -will need aggressive vit D replacement. 4000-5000U daily for now.  #2 bilateral pulmonary embolism and DVT -multiple  risk factors including dehydration pneumonia malignancy decreased mobility. Plan -Patient is on Lovenox. -would continue lovenox provided renal function remains stable  #3 severe diarrhea with multiple electrolyte abnormalities and acute kidney injury. 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 This was previously grade 1-2 and required oral prednisone which was tapered off. She was supposed to be on mesalamine as per her GI doctor but wasn't taking this appropriately. CT abdomen shows no acute new concerns. GI panel negative C. difficile negative 2. Diarrhea better today. Plan -Would treat her with IV steroids started on dexamethasone 4 mg by mouth every 12 hours. -She is also on oral budesonide. Would discontinue the IV dexamethasone once diarrhea started settling down given rapid GI transit time budesonide may not be as effective immediately. -On Lomotil and cholestyramine per GI. -On higher dose of mesalamine. -On probiotics. -Need to continue to monitor. -Might need higher doses of steroids  and is steroid refractory and not responding so on my repeat flexible sigmoidoscopy to confirm that this is still immune colitis. -If this is steroid refractory immune colitis might sometimes need to consider the use of infliximab. -Acute renal failure improving with hydration. Electrolyte management as per hospitalist and nephrologist. 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 -Continue input from nutritional therapies. -Hopefully this will improve as her diarrhea settles down.  Patient transferred out of ICU today. Will need evaluation by therapies and consideration for SNF on discharge.  I appreciate the excellent care by the hospitalist team, nephrology and and all the other team members.  I spent 25 minutes counseling the patient face to face. The total time spent in the appointment was 30 minutes and more than 50% was on counseling and direct patient cares.    Sullivan Lone MD [place] AAHIVMS [facility] Hematology/Oncology Physician [facility]  (Office):       [phone] (Work cell):  [phone] (Fax):           [phone]

## 2016-09-09 NOTE — Progress Notes (Signed)
CRITICAL VALUE ALERT  Critical value received:  potassium 2.6  Date of notification:  09/09/16  Time of notification:  7371  Critical value read back:Yes.    Nurse who received alert:  Zeven Kocak  MD notified (1st page):  Dr. Grandville Silos   Time of first page:  1548  MD notified (2nd page):  Time of second page:  Responding MD:  .  Time MD responded:  .

## 2016-09-09 NOTE — Clinical Social Work Placement (Signed)
   CLINICAL SOCIAL WORK PLACEMENT  NOTE  Date:  09/09/2016  Patient Details  Name: DYNISHA DUE MRN: [id number] Date of Birth: [birth year]  Clinical Social Work is seeking post-discharge placement for this patient at the [place] level of care (*CSW will initial, date and re-position this form in  chart as items are completed):  Yes   Patient/family provided with [facility] Work Department's list of facilities offering this level of care within the geographic area requested by the patient (or if unable, by the patient's family).  Yes   Patient/family informed of their freedom to choose among providers that offer the needed level of care, that participate in Medicare, Medicaid or managed care program needed by the patient, have an available bed and are willing to accept the patient.  Yes   Patient/family informed of [facility]'s ownership interest in [facility] and [facility], as well as of the fact that they are under no obligation to receive care at these facilities.  PASRR submitted to EDS on 09/09/16     PASRR number received on 09/09/16     Existing PASRR number confirmed on       FL2 transmitted to all facilities in geographic area requested by pt/family on       FL2 transmitted to all facilities within larger geographic area on 09/09/16     Patient informed that his/her managed care company has contracts with or will negotiate with certain facilities, including the following:            Patient/family informed of bed offers received.  Patient chooses bed at       Physician recommends and patient chooses bed at      Patient to be transferred to   on  .  Patient to be transferred to facility by       Patient family notified on   of transfer.  Name of family member notified:        PHYSICIAN       Additional Comment:    _______________________________________________ Luretha Rued, [place] 09/09/2016,  2:26 PM

## 2016-09-09 NOTE — NC FL2 (Signed)
[place] LEVEL OF CARE SCREENING TOOL     IDENTIFICATION  Patient Name: Cindy Byrd Birthdate: January 26, 1941 Sex: female Admission Date (Current Location): 09/01/2016  [facility] and [place] Number:  Herbalist and Address:  [facility],  [place] [address] [place]      Provider Number: [id number]  Attending Physician Name and Address:  Eugenie Filler, MD  Relative Name and Phone Number:       Current Level of Care: Hospital Recommended Level of Care: [place] Prior Approval Number:    Date Approved/Denied:   PASRR Number: [id number]  Discharge Plan: SNF    Current Diagnoses: Patient Active Problem List   Diagnosis Date Noted  . Hypocalcemia 09/08/2016  . Vitamin D deficiency 09/08/2016  . New onset a-fib ([place]) 09/08/2016  . Chest pain   . Thrush of mouth and esophagus ([place])   . Protein-calorie malnutrition, severe ([place])   . Colitis determined by colorectal biopsy   . Bilateral pulmonary embolism ([place]) 09/07/2016  . DVT of lower extremity, bilateral ([place]) 09/07/2016  . Palliative care by specialist   . Goals of care, counseling/discussion   . Advance care planning   . Diarrhea   . Ulcerative pancolitis without complication ([employer])   . Cancer ([place])   . Pressure injury of skin 09/02/2016  . HCAP (healthcare-associated pneumonia) 09/01/2016  . ARF (acute renal failure) ([place]) 09/01/2016  . Nausea with vomiting 07/18/2016  . Dehydration 07/18/2016  . Hypokalemia 07/18/2016  . Hypoalbuminemia due to protein-calorie malnutrition ([place]) 07/18/2016  . Peripheral edema 07/18/2016  . Diarrhea due to drug 05/19/2016  . Port catheter in place 04/07/2016  . Nicotine addiction 01/19/2016  . Barrett's esophagus determined by biopsy 09/04/2015  . Gastritis   . Primary malignant neoplasm of lung metastatic to other site ([place]) 08/06/2015  . Esophageal reflux 08/04/2015  . Bone metastasis ([place]) 06/24/2015  .  Neoplasm related pain 06/24/2015  . Protein calorie malnutrition ([place]) 06/24/2015  . Anorexia 06/24/2015  . Heavy smoker (more than 20 cigarettes per day) 06/24/2015  . Depression 06/24/2015  . HTN (hypertension) 06/24/2015    Orientation RESPIRATION BLADDER Height & Weight     Self, Time, Situation, Place  O2 Indwelling catheter Weight: 143 lb 11.8 oz (65.2 kg) Height:  '5\' 8"'$  (172.7 cm)  BEHAVIORAL SYMPTOMS/MOOD NEUROLOGICAL BOWEL NUTRITION STATUS  Other (Comment) (no behaviors)   Incontinent Diet  AMBULATORY STATUS COMMUNICATION OF NEEDS Skin   Limited Assist Verbally Other (Comment) (Stage 1 ulcer buttocks / Stage 2 ulcer buttocks / rash on left / right upper toes)                       Personal Care Assistance Level of Assistance  Bathing, Feeding, Dressing Bathing Assistance: Limited assistance Feeding assistance: Independent Dressing Assistance: Limited assistance     Functional Limitations Info  Sight, Hearing, Speech   Hearing Info: Adequate Speech Info: Adequate    SPECIAL CARE FACTORS FREQUENCY  PT (By licensed PT), OT (By licensed OT)     PT Frequency: 5x wk OT Frequency: 5x wk            Contractures Contractures Info: Not present    Additional Factors Info  Code Status Code Status Info: DNR             Current Medications (09/09/2016):  This is the current hospital active medication list Current Facility-Administered Medications  Medication Dose Route Frequency Provider  Last Rate Last Dose  . acetaminophen (TYLENOL) tablet 650 mg  650 mg Oral Q6H PRN Rise Patience, MD   650 mg at 09/03/16 1411   Or  . acetaminophen (TYLENOL) suppository 650 mg  650 mg Rectal Q6H PRN Rise Patience, MD      . amLODipine (NORVASC) tablet 5 mg  5 mg Oral Daily Rise Patience, MD   5 mg at 09/09/16 1151  . budesonide (ENTOCORT EC) 24 hr capsule 9 mg  9 mg Oral Daily Amy S Esterwood, PA-C   9 mg at 09/09/16 1210  . calcium gluconate 2 g in  sodium chloride 0.9 % 100 mL IVPB  2 g Intravenous Once Eugenie Filler, MD      . calcium-vitamin D (OSCAL WITH D) 500-200 MG-UNIT per tablet 2 tablet  2 tablet Oral TID Eugenie Filler, MD   2 tablet at 09/09/16 1152  . chlorhexidine (PERIDEX) 0.12 % solution 15 mL  15 mL Mouth Rinse BID Hosie Poisson, MD   15 mL at 09/09/16 1152  . cholestyramine (QUESTRAN) packet 4 g  4 g Oral 2 times per day Alfredia Ferguson, PA-C   4 g at 09/09/16 1212  . citalopram (CELEXA) tablet 20 mg  20 mg Oral Daily Rise Patience, MD   20 mg at 09/09/16 1153  . dexamethasone (DECADRON) injection 4 mg  4 mg Intravenous Q12H [name], MD   4 mg at 09/09/16 1152  . diphenoxylate-atropine (LOMOTIL) 2.5-0.025 MG per tablet 1-2 tablet  1-2 tablet Oral QID PRN Rise Patience, MD   1 tablet at 09/05/16 1741  . enoxaparin (LOVENOX) injection 60 mg  60 mg Subcutaneous Q12H Berton Mount, RPH   60 mg at 09/09/16 1211  . famotidine (PEPCID) tablet 20 mg  20 mg Oral Daily Eugenie Filler, MD   20 mg at 09/09/16 1203  . feeding supplement (PEDIASURE PEPTIDE 1.0 CAL) (PEDIASURE PEPTIDE 1.0 CAL) liquid 237 mL  237 mL Oral TID BM Sandi Mealy Zeigler, RPH   237 mL at 09/09/16 1200  . hydrALAZINE (APRESOLINE) injection 10 mg  10 mg Intravenous Q4H PRN Rise Patience, MD      . lactobacillus acidophilus & bulgar (LACTINEX) chewable tablet 1 tablet  1 tablet Oral TID WC Rise Patience, MD   1 tablet at 09/09/16 1151  . LORazepam (ATIVAN) tablet 1 mg  1 mg Oral Q8H PRN Rise Patience, MD   1 mg at 09/06/16 2044  . magic mouthwash  5 mL Oral TID Earlie Counts, NP   5 mL at 09/09/16 1152  . MEDLINE mouth rinse  15 mL Mouth Rinse q12n4p Hosie Poisson, MD   15 mL at 09/08/16 1523  . mesalamine (LIALDA) EC tablet 4.8 g  4.8 g Oral Q breakfast Amy S Esterwood, PA-C   4.8 g at 09/09/16 0736  . ondansetron (ZOFRAN) 8 mg in sodium chloride 0.9 % 50 mL IVPB  8 mg Intravenous Q6H PRN Eugenie Filler, MD      .  oxyCODONE-acetaminophen (PERCOCET/ROXICET) 5-325 MG per tablet 1 tablet  1 tablet Oral Q4H PRN Rise Patience, MD   1 tablet at 09/08/16 2018  . potassium chloride SA (K-DUR,KLOR-CON) CR tablet 40 mEq  40 mEq Oral Q4H Eugenie Filler, MD   40 mEq at 09/09/16 1152  . promethazine (PHENERGAN) tablet 25 mg  25 mg Oral Q6H PRN Hosie Poisson, MD   25  mg at 09/06/16 1018  . sodium chloride 0.225 % with sodium bicarbonate 50 mEq infusion   Intravenous Continuous Roney Jaffe, MD 100 mL/hr at 09/09/16 0800    . sodium chloride flush (NS) 0.9 % injection 10-40 mL  10-40 mL Intracatheter PRN Rise Patience, MD   10 mL at 09/06/16 0410  . zolpidem (AMBIEN) tablet 5 mg  5 mg Oral QHS PRN Rise Patience, MD   5 mg at 09/08/16 2018     Discharge Medications: Please see discharge summary for a list of discharge medications.  Relevant Imaging Results:  Relevant Lab Results:   Additional Information SS # [id number].  Pt has appts at [facility] on 10/31 & 11/28.  Iyanah Demont, Randall An, LCSW

## 2016-09-09 NOTE — Progress Notes (Signed)
CRITICAL VALUE ALERT  Critical value received:  Calcium 5  Date of notification:  09-09-16  Time of notification:  0912  Critical value read back: Yes  Nurse who received alert:  Kazimir Hartnett  MD notified (1st page):  [place] Silos  Time of first page:  [phone]

## 2016-09-09 NOTE — Progress Notes (Signed)
Cindy Byrd   HEMATOLOGY/ONCOLOGY INPATIENT PROGRESS NOTE  Date of Service: 09/09/2016  Inpatient Attending: .Eugenie Filler, MD   SUBJECTIVE  Cindy Byrd was seen in the ICU. She has a history of metastatic non-small cell lung cancer which was treated with Nivolumab x 20 cycles and had an excellent response and had no evidence of lung cancer progression during the time. This was discontinued about 3 months ago due to grade 2 immune colitis which was managed with oral prednisone and Lialda and had improved back to her baseline with chronic issues with diarrhea related to possible IBS. Patient was being monitored by Dr. [name] from GI . Her last CT chest abdomen pelvis on 06/24/2016 showed no evidence of new disease or progression of metastatic disease . She was scheduled for follow-up but got admitted with pleuritic chest pain . She was noted to have bilateral PE and a DVT and also pneumonia. Has been on IV antibiotics and was started on Lovenox.  She developed severe diarrhea with rapid worsening of her electrolytes some acute kidney injury and hypoalbuminemia. C. difficile negative. GI panel negative She has been evaluated by GI and her dose of the Lialda was increased. She has also had atrial fibrillation (new onset in the setting of medical at abnormalities).  Patient was seen in the ICU and is now off oxygen. Notes slightly improved oral intake. Feeling fairly weak still.  Chest pain is better.    OBJECTIVE:  Mildly distressed from diarrhea  PHYSICAL EXAMINATION: . Vitals:   09/09/16 0700 09/09/16 0748 09/09/16 0800 09/09/16 0900  BP:   (!) 128/48   Pulse: 88  86 93  Resp: '19  18 17  '$ Temp:  98 F (36.7 C)    TempSrc:  Oral    SpO2: 97%  94% 95%  Weight:      Height:       Filed Weights   09/06/16 0424 09/07/16 0500 09/08/16 0458  Weight: 128 lb 1.4 oz (58.1 kg) 138 lb 14.2 oz (63 kg) 143 lb 11.8 oz (65.2 kg)   .Body mass index is 21.86 kg/m.  GENERAL:fatigued appearing, not  on oxygen, no pain SKIN: no acute rashes EYES: normal, conjunctiva are pink and non-injected, sclera clear OROPHARYNX:mucous membranes dry NECK: supple, no JVD, thyroid normal size, non-tender, without nodularity LYMPH:  no palpable lymphadenopathy in the cervical, axillary or inguinal LUNGS:coarse breath sounds, few basal rales HEART: b/l pedal edema ABDOMEN: abdomen midly tender, no rigidity or rebound PSYCH: anxious/fatigue apeparing NEURO: no focal motor/sensory deficits  MEDICAL HISTORY:  Past Medical History:  Diagnosis Date  . Barrett's esophagus   . Bone neoplasm 06/24/2015  . Cancer [facility])    metastatic poorly differentiated carcinoma. tumor left groin surgical removal with radiation tx.  . Cataract    BILATERAL  . Cigarette smoker two packs a day or less    Currently still smoking 2 PPD - Not interested in quitting at this time.  . Colitis 2017  . Colon polyps    hyperplastic, tubular adenomas, tubulovillous adenoma  . Cough, persistent    hx. lung cancer ? primary-being evaluated, unsure of primary site.  . Depression 06/24/2015  . Diverticulosis   . Emphysema of lung ([place])   . Endometriosis    Hysterectomy with BSO at age 29 yrs  . Esophageal adenocarcinoma ([place]) 08/11/15   intramucosal  . Gastritis   . GERD (gastroesophageal reflux disease)   . H/O: pneumonia   . Heavy smoker (more than 20 cigarettes per  day) 06/24/2015  . Hiatal hernia   . Hyperlipidemia   . Hypertension 06/24/2015   likely improved incidental to 40 lbs weight loss from her neoplasm. No Longer taking med for this as of 08-06-15  . IBS (irritable bowel syndrome)   . Pain    left hip-persistent"tumor of bone"-radiation tx. 10.  . Pulmonary nodules   . Swelling of ankle    bilateral  . Vitamin D deficiency disease     SURGICAL HISTORY: Past Surgical History:  Procedure Laterality Date  . ABDOMINAL HYSTERECTOMY    . BARTHOLIN GLAND CYST EXCISION  75 yo ago   Does not want if it was an  infected cyst or tumor. Was soon as delivery  . COLONOSCOPY W/ POLYPECTOMY     multiple times - last done 09/2014 per patient.  . ESOPHAGOGASTRODUODENOSCOPY (EGD) WITH PROPOFOL N/A 08/11/2015   Procedure: ESOPHAGOGASTRODUODENOSCOPY (EGD) WITH PROPOFOL;  Surgeon: [name], MD;  Location: [facility];  Service: Gastroenterology;  Laterality: N/A;  . GANGLION CYST EXCISION    . KNEE ARTHROSCOPY  age about 20 yrs  . TONSILLECTOMY    . TOTAL ABDOMINAL HYSTERECTOMY W/ BILATERAL SALPINGOOPHORECTOMY  at age 61 yrs   For endometriosis    SOCIAL HISTORY: Social History   Social History  . Marital status: Widowed    Spouse name: N/A  . Number of children: 2  . Years of education: N/A   Occupational History  . Not on file.   Social History Main Topics  . Smoking status: Former Smoker    Packs/day: 1.00    Years: 60.00    Types: Cigarettes    Quit date: 12/06/2015  . Smokeless tobacco: Never Used  . Alcohol use No  . Drug use: No  . Sexual activity: No   Other Topics Concern  . Not on file   Social History Narrative  . No narrative on file    FAMILY HISTORY: Family History  Problem Relation Age of Onset  . Colon cancer Brother   . Colon cancer Brother   . Stroke Mother   . Colon cancer Father   . Breast cancer Daughter 43    ER/PR+ stage II    ALLERGIES:  is allergic to penicillins; remeron [mirtazapine]; and latex.  MEDICATIONS:  Scheduled Meds: . amLODipine  5 mg Oral Daily  . budesonide  9 mg Oral Daily  . calcium gluconate  2 g Intravenous Once  . calcium-vitamin D  2 tablet Oral TID  . chlorhexidine  15 mL Mouth Rinse BID  . cholestyramine  4 g Oral 2 times per day  . citalopram  20 mg Oral Daily  . dexamethasone  4 mg Intravenous Q12H  . enoxaparin (LOVENOX) injection  60 mg Subcutaneous Q12H  . famotidine  20 mg Oral Daily  . feeding supplement  1 Container Oral BID BM  . feeding supplement (PRO-STAT SUGAR FREE 64)  30 mL Oral Daily  . lactobacillus  acidophilus & bulgar  1 tablet Oral TID WC  . magic mouthwash  5 mL Oral TID  . mouth rinse  15 mL Mouth Rinse q12n4p  . mesalamine  4.8 g Oral Q breakfast  . potassium chloride  40 mEq Oral Q4H   Continuous Infusions: .  sodium bicarbonate infusion 1/4 NS 1000 mL 100 mL/hr at 09/09/16 0800   PRN Meds:.acetaminophen **OR** acetaminophen, diphenoxylate-atropine, hydrALAZINE, LORazepam, [DISCONTINUED] ondansetron **OR** ondansetron (ZOFRAN) IV, oxyCODONE-acetaminophen, promethazine, sodium chloride flush, zolpidem  REVIEW OF SYSTEMS:    10  Point review of Systems was done is negative except as noted above.   LABORATORY DATA:  I have reviewed the data as listed  . CBC Latest Ref Rng & Units 09/09/2016 09/08/2016 09/04/2016  WBC 4.0 - 10.5 K/uL 1.4(LL) 2.7(L) 8.3  Hemoglobin 12.0 - 15.0 g/dL 9.6(L) 9.8(L) 12.2  Hematocrit 36.0 - 46.0 % 27.8(L) 28.9(L) 35.8(L)  Platelets 150 - 400 K/uL 229 206 293    . CMP Latest Ref Rng & Units 09/09/2016 09/08/2016 09/07/2016  Glucose 65 - 99 mg/dL 97 70 72  BUN 6 - 20 mg/dL 21(H) 34(H) 43(H)  Creatinine 0.44 - 1.00 mg/dL 1.14(H) 1.46(H) 1.82(H)  Sodium 135 - 145 mmol/L 133(L) 131(L) 137  Potassium 3.5 - 5.1 mmol/L 2.9(L) 2.2(LL) 3.4(L)  Chloride 101 - 111 mmol/L 101 103 116(H)  CO2 22 - 32 mmol/L 20(L) 20(L) 14(L)  Calcium 8.9 - 10.3 mg/dL 5.0(LL) 4.7(LL) 5.2(LL)  Total Protein 6.5 - 8.1 g/dL - 4.3(L) -  Total Bilirubin 0.3 - 1.2 mg/dL - 1.0 -  Alkaline Phos 38 - 126 U/L - 79 -  AST 15 - 41 U/L - 51(H) -  ALT 14 - 54 U/L - 42 -     RADIOGRAPHIC STUDIES: I have personally reviewed the radiological images as listed and agreed with the findings in the report. Ct Abdomen Pelvis Wo Contrast  Result Date: 09/04/2016 CLINICAL DATA:  Metastatic non-small-cell lung cancer under observation. History of Barrett's esophagus, anemia and colitis. History of esophageal adenocarcinoma. EXAM: CT ABDOMEN AND PELVIS WITHOUT CONTRAST TECHNIQUE: Multidetector CT  imaging of the abdomen and pelvis was performed following the standard protocol without IV contrast. COMPARISON:  CT 06/24/2016 and 04/19/2016. FINDINGS: Lower chest: New patchy dependent airspace opacities in both lower lobes. There is no significant pleural or pericardial effusion. Hepatobiliary: There is decreased hepatic density consistent with steatosis. Multiple low-density hepatic lesions are grossly stable, likely cysts. No new or enlarging lesions are identified. The gallbladder is distended without wall thickening. No evidence of gallstones, surrounding inflammation or biliary dilatation. Pancreas: Unremarkable. No pancreatic ductal dilatation or surrounding inflammatory changes. Spleen: Normal in size without focal abnormality. Adrenals/Urinary Tract: Both adrenal glands appear normal. Both kidneys appear normal. There is no evidence of renal mass, hydronephrosis or urinary tract calculus. Left ovarian vein phleboliths are grossly stable. There is high-density material within the urinary bladder. No focal urothelial lesions are seen. Stomach/Bowel: The stomach is decompressed. There is fluid throughout the small bowel and colon. No bowel wall thickening or surrounding inflammatory change identified. The appendix appears normal. There are mild diverticular changes of the sigmoid colon. Rectal tube in place. Vascular/Lymphatic: There are no enlarged abdominal or pelvic lymph nodes. Aortic and branch vessel atherosclerosis. Reproductive: Hysterectomy.  No evidence of adnexal mass. Other: No ascites, peritoneal nodularity or free air. There is soft tissue stranding and subcutaneous air within the subcutaneous fat of the left anterior abdominal wall, presumably from subcutaneous injections. Musculoskeletal: Grossly stable destructive mass involving the left iliac bone with associated pathologic fracture. No new osseous lesions or new fractures are seen. IMPRESSION: 1. Grossly stable metastasis involving the  left iliac bone with associated pathologic fracture. 2. No other evidence of metastatic disease within the abdomen or pelvis. 3. New patchy airspace opacities at both lung bases may reflect atelectasis or aspiration. Correlate clinically. 4. Fluid-filled small and large bowel without significant distention or wall thickening. Mild sigmoid diverticulosis. Electronically Signed   By: Richardean Sale M.D.   On: 09/04/2016 17:34  Dg Chest 2 View  Result Date: 09/01/2016 CLINICAL DATA:  Left lower chest abdominal pain beginning today. Dyspnea. Metastatic lung carcinoma. EXAM: CHEST  2 VIEW COMPARISON:  10/03/2014 FINDINGS: The heart size and mediastinal contours remain within normal limits. Aortic atherosclerosis. Right-sided power port remains in appropriate position. New airspace opacity is seen in the left lower lobe, suspicious for pneumonia. There is new linear opacity in the right lower lung, suspicious for atelectasis or scarring. No evidence of pneumothorax or pleural effusion. IMPRESSION: New airspace opacity in left lower lobe, suspicious for pneumonia. New right lower lung atelectasis versus scarring. Electronically Signed   By: Earle Gell M.D.   On: 09/01/2016 17:53   Ct Head Wo Contrast  Result Date: 09/07/2016 CLINICAL DATA:  Altered mental status, history of esophageal cancer, rule out metastasis EXAM: CT HEAD WITHOUT CONTRAST TECHNIQUE: Contiguous axial images were obtained from the base of the skull through the vertex without intravenous contrast. COMPARISON:  07/20/2015 CT FINDINGS: Brain: Sulcal prominence over the convexity of the brain consistent with superficial atrophy. No significant central atrophy. No focus of hemorrhage, midline shift or edema. Ventricles are not dilated. No acute infarction. Lack of IV contrast limits assessment for metastatic disease. Vascular: Minimal atherosclerosis of the cavernous internal carotids. Skull: No lytic or blastic disease.  No acute osseous  abnormality. Sinuses/Orbits: Bilateral ocular lens surgical change. Mucosal thickening in the sphenoid and posterior ethmoid sinus. No mastoid effusion. Other: None IMPRESSION: Superficial atrophy. No acute intracranial abnormality. No findings of metastatic disease to the brain however lack of IV contrast limits assessment. Electronically Signed   By: Ashley Royalty M.D.   On: 09/07/2016 13:48   Ct Angio Chest Pe W Or Wo Contrast  Result Date: 09/02/2016 CLINICAL DATA:  Pt with metastatic non-small cell lung cancer under observation, hypertension, Barrett's esophagus, anemia and colitis secondary to Nivolumab with persistent diarrhea, presents to the ER because of chest pain. EXAM: CT ANGIOGRAPHY CHEST WITH CONTRAST TECHNIQUE: Multidetector CT imaging of the chest was performed using the standard protocol during bolus administration of intravenous contrast. Multiplanar CT image reconstructions and MIPs were obtained to evaluate the vascular anatomy. CONTRAST:  100 mL Isovue 370 COMPARISON:  CT chest 06/24/2016, 04/19/2016, 01/14/2016 FINDINGS: Cardiovascular: Satisfactory opacification of the pulmonary arteries to the segmental level. Occlusive pulmonary embolus in the left lower lobe pulmonary artery. Pulmonary embolus in the right lower lobe segmental pulmonary artery. Normal heart size. No pericardial effusion. Thoracic aortic atherosclerosis. Mediastinum/Nodes: No enlarged mediastinal, hilar, or axillary lymph nodes. Thyroid gland, trachea, and esophagus demonstrate no significant findings. Lungs/Pleura: Small left pleural effusion. Right basilar airspace disease which may reflect atelectasis versus pneumonia versus pulmonary infarction. Left basilar disease which may reflect atelectasis versus pneumonia versus less likely developing pulmonary infarction. Bilateral centrilobular emphysema. Upper Abdomen: No acute upper abdominal abnormality. Small hiatal hernia. Musculoskeletal: No acute osseous abnormality.  No aggressive lytic or sclerotic osseous lesion Review of the MIP images confirms the above findings. IMPRESSION: 1. Acute bilateral pulmonary emboli as described above. Positive for acute PE with CT evidence of right heart strain (RV/LV Ratio = 1.07) consistent with at least submassive (intermediate risk) PE. The presence of right heart strain has been associated with an increased risk of morbidity and mortality. Please activate Code PE by paging [phone]. 2. Right basilar airspace disease which may reflect atelectasis versus pneumonia versus pulmonary infarction. Left basilar disease which may reflect atelectasis versus pneumonia versus less likely developing pulmonary infarction. 3.  Aortic Atherosclerosis (ICD10-170.0) 4.  Emphysema. (XOV29-V91.9) Critical Value/emergent results were called by telephone at the time of interpretation on 09/02/2016 at 10:06 am to Dr. Karleen Hampshire, who verbally acknowledged these results. Electronically Signed   By: Kathreen Devoid   On: 09/02/2016 10:07    ASSESSMENT & PLAN:   75 year old with  #1 metastatic non-small cell lung cancer with bilateral lung nodules and large metastatic lesion of the left Ilium. Recent CT of the head on 09/07/2016 negative for brain metastases CT abdomen and pelvis 09/04/2016 stable metastasis involving the left iliac bone. No other metastatic disease within the abdomen and pelvis. CT of the chest 09/02/2016 - acute bilateral pulmonary emboli. Right basilar pneumonia versus infarction. No overt evidence of progressive metastatic malignancy. Plan -From lung cancer standpoint the patient has been on Active surveillance only with no systemic therapies for several months. -She has been on monthly Xgeva for her bone metastases. This could attribute to hypocalcemia.  #2 bilateral pulmonary embolism and DVT -multiple risk factors including dehydration pneumonia malignancy decreased mobility. Plan -Patient is on Lovenox.  #3 severe diarrhea with  multiple electrolyte abnormalities and acute kidney injury. 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 This was previously grade 1-2 and required oral prednisone which was tapered off. She was supposed to be on mesalamine as per her GI doctor but wasn't taking this appropriately. CT abdomen shows no acute new concerns. GI panel negative C. difficile negative 2. Plan -Would treat her with IV steroids started on dexamethasone 4 mg by mouth every 12 hours. -She is also on oral budesonide. Would discontinue the IV dexamethasone once diarrhea started settling down given rapid GI transit time budesonide may not be as effective immediately. -On Lomotil and cholestyramine per GI. -On higher dose of mesalamine. -On probiotics. -Need to continue to monitor. -Might need higher doses of steroids and is steroid refractory and not responding so on my repeat flexible sigmoidoscopy to confirm that this is still immune colitis. -If this is steroid refractory immune colitis might sometimes need to consider the use of infliximab. -Acute renal failure improving with hydration. Electrolyte management as per hospitalist and nephrologist.  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 -Continue input from nutritional therapies. -Hopefully this will improve as her diarrhea settles down.  I appreciate the excellent care by the hospitalist team, nephrology and and all the other team members.  I spent 40 minutes counseling the patient face to face. The total time spent in the appointment was 40 minutes and more than 50% was on counseling and direct patient cares.    Sullivan Lone MD [place] AAHIVMS [facility] Hematology/Oncology Physician [facility]  (Office):       [phone] (Work cell):  [phone] (Fax):           [phone]

## 2016-09-09 NOTE — Progress Notes (Signed)
Patient ID: DEONA NOVITSKI, female   DOB: 1940-12-27, 75 y.o.   MRN: [id number]    Progress Note   Subjective   Feeling better- no c/o abdominal pain, and says nausea gone. Rectal tube out - one BM this am per pt much less diarrhea  Stool output significantly decreased- 200 cc past 24 hours   WBC 1.4      Objective   Vital signs in last 24 hours: Temp:  [97.3 F (36.3 C)-98.6 F (37 C)] 98 F (36.7 C) (10/06 0748) Pulse Rate:  [78-93] 93 (10/06 0900) Resp:  [15-24] 17 (10/06 0900) BP: (95-154)/(40-133) 128/48 (10/06 0800) SpO2:  [94 %-97 %] 95 % (10/06 0900) Last BM Date: 09/08/16 General:  elderly  white female in NAD Heart:  Regular rate and rhythm; no murmurs Lungs: Respirations even and unlabored, lungs CTA bilaterally Abdomen:  Soft, nontender and nondistended. Normal bowel sounds. Extremities:  Without edema. Neurologic:  Alert and oriented,  grossly normal neurologically. Psych:  Cooperative. Normal mood and affect.  Intake/Output from previous day: 10/05 0701 - 10/06 0700 In: 4937 [P.O.:820; I.V.:3652.3; IV Piggyback:464.7] Out: 2500 [Urine:2300; Stool:200] Intake/Output this shift: Total I/O In: 454 [I.V.:400; IV Piggyback:54] Out: -   Lab Results:  Recent Labs  09/08/16 0335 09/09/16 0400  WBC 2.7* 1.4*  HGB 9.8* 9.6*  HCT 28.9* 27.8*  PLT 206 229   BMET  Recent Labs  09/07/16 0408 09/07/16 1130 09/08/16 0335 09/09/16 0400  NA 137  --  131* 133*  K 3.4*  --  2.2* 2.9*  CL 116*  --  103 101  CO2 14*  --  20* 20*  GLUCOSE 72  --  70 97  BUN 43*  --  34* 21*  CREATININE 1.82*  --  1.46* 1.14*  CALCIUM 5.3* 5.2* 4.7* 5.0*   LFT  Recent Labs  09/08/16 0335  PROT 4.3*  ALBUMIN 1.6*  AST 51*  ALT 42  ALKPHOS 79  BILITOT 1.0   PT/INR No results for input(s): LABPROT, INR in the last 72 hours.  Studies/Results: Ct Head Wo Contrast  Result Date: 09/07/2016 CLINICAL DATA:  Altered mental status, history of esophageal cancer, rule  out metastasis EXAM: CT HEAD WITHOUT CONTRAST TECHNIQUE: Contiguous axial images were obtained from the base of the skull through the vertex without intravenous contrast. COMPARISON:  07/20/2015 CT FINDINGS: Brain: Sulcal prominence over the convexity of the brain consistent with superficial atrophy. No significant central atrophy. No focus of hemorrhage, midline shift or edema. Ventricles are not dilated. No acute infarction. Lack of IV contrast limits assessment for metastatic disease. Vascular: Minimal atherosclerosis of the cavernous internal carotids. Skull: No lytic or blastic disease.  No acute osseous abnormality. Sinuses/Orbits: Bilateral ocular lens surgical change. Mucosal thickening in the sphenoid and posterior ethmoid sinus. No mastoid effusion. Other: None IMPRESSION: Superficial atrophy. No acute intracranial abnormality. No findings of metastatic disease to the brain however lack of IV contrast limits assessment. Electronically Signed   By: Ashley Royalty M.D.   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 Etiology   Principal Problem:   Bilateral pulmonary embolism (HCC) Active Problems:   Protein calorie malnutrition (HCC)   HTN (hypertension)   Metastatic lung cancer (metastasis from lung to other site) ([place])   Barrett's esophagus determined by biopsy   Nausea with vomiting   Hypokalemia   HCAP (healthcare-associated pneumonia)   ARF (acute renal failure) (HCC)   Pressure injury of skin   Cancer (HCC)   Diarrhea   Ulcerative pancolitis without complication ([place])    DVT of lower extremity, bilateral ([place])   Palliative care by specialist   Goals of care, counseling/discussion   Advance care planning   Hypocalcemia   Vitamin D deficiency   Chest pain   New onset a-fib ([place])   Thrush of mouth and esophagus ([place])   Protein-calorie malnutrition, severe ([place])   Colitis determined by colorectal biopsy     LOS: 7 days   Cohen Doleman  09/09/2016, 9:59 AM

## 2016-09-09 NOTE — Progress Notes (Addendum)
NUTRITION NOTE  Per rounds this AM, pt was drinking PediasurePTA and is interested in receiving it during hospitalization. Will d/c orders for Boost Breeze and Prostat. Order in place per pharmacy for Pediasure TID; each supplement provides 240 kcal and 7 grams of protein.   Will continue to follow per protocol.    Jarome Matin, MS, RD, LDN Inpatient Clinical Dietitian Pager # [phone] After hours/weekend pager # [phone]

## 2016-09-09 NOTE — Progress Notes (Signed)
ANTICOAGULATION CONSULT NOTE - Follow up [place] for Lovenox Indication: pulmonary embolus  Allergies  Allergen Reactions  . Penicillins Other (See Comments)    Unknown; childhood allergy  . Remeron [Mirtazapine] Other (See Comments)    nightmares  . Latex Rash    Patient Measurements: Height: '5\' 8"'$  (172.7 cm) Weight: 143 lb 11.8 oz (65.2 kg) IBW/kg (Calculated) : 63.9  Vital Signs: Temp: 98 F (36.7 C) (10/06 0748) Temp Source: Oral (10/06 0748) BP: 126/83 (10/06 0400) Pulse Rate: 81 (10/06 0400)  Labs:  Recent Labs  09/07/16 0408 09/08/16 0335 09/09/16 0400  HGB  --  9.8* 9.6*  HCT  --  28.9* 27.8*  PLT  --  206 229  CREATININE 1.82* 1.46* 1.14*    Estimated Creatinine Clearance: 42.4 mL/min (by C-G formula based on SCr of 1.14 mg/dL (H)).   Infusions:  .  sodium bicarbonate infusion 1/4 NS 1000 mL 100 mL/hr at 09/09/16 0400    Assessment: 75 yo female presented to ER with LUQ pain and diarrhea. 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 Patient currently not receiving any chemotherapy but is receiving Xgeva for bone mets.  She is known to pharmacy for antibiotic consults for possible PNA. CT now shows acute bilateral PE.  Pharmacy is consulted to dose Lovenox.  Prophylactic Lovenox '40mg'$  started on admission; last given SQ on 9/28 at ~2300.  Today, 09/09/2016:  CBC:  Hgb low/stable, Plt remain WNL  SCr improving, CrCl ~ 42 ml/min  No bleeding or complications reported.  Goal of Therapy:  Anti-Xa level 0.6-1 units/ml 4hrs after LMWH dose given Monitor platelets by anticoagulation protocol: Yes   Plan:   Change Lovenox 60 mg SQ q12h for acute PE  Monitor CBC at least q72h while in hospital , renal function  Doreene Eland, PharmD, BCPS.   Pager: [phone] 09/09/2016 8:54 AM

## 2016-09-09 NOTE — Progress Notes (Signed)
CRITICAL VALUE ALERT  Critical value received:  Calcium 4.9  Date of notification:  09/09/16  Time of notification:  0762  Critical value read back:Yes.    Nurse who received alert:  Catherine Cubero  MD notified (1st page):  Dr. Grandville Silos  Time of first page:  1549  MD notified (2nd page):  Time of second page:  Responding MD:  Dr. Grandville Silos  Time MD responded:  1600

## 2016-09-09 NOTE — Progress Notes (Signed)
CRITICAL VALUE ALERT  Critical value received:  WBC  Date of notification:  09/10/2015  Time of notification:  0451  Critical value read back:Yes.    Nurse who received alert:  Ayesha Mohair RN  MD notified (1st page):  Baltazar Najjar NP  Time of first page:  [phone]

## 2016-09-09 NOTE — Clinical Social Work Note (Signed)
Clinical Social Work Assessment  Patient Details  Name: Cindy Byrd MRN: [id number] Date of Birth: November 04, 1941  Date of referral:  09/09/16               Reason for consult:  Discharge Planning, Facility Placement                Permission sought to share information with:  Chartered certified accountant granted to share information::  Yes, Verbal Permission Granted  Name::        Agency::     Relationship::     Contact Information:     Housing/Transportation Living arrangements for the past 2 months:  Single Family Home Source of Information:  Patient, Adult Children Patient Interpreter Needed:  None Criminal Activity/Legal Involvement Pertinent to Current Situation/Hospitalization:  No - Comment as needed Significant Relationships:  Adult Children Lives with:  Self Do you feel safe going back to the place where you live?  No (SNF recommended.) Need for family participation in patient care:  Yes (Comment)  Care giving concerns:  Pt's care cannot be managed at home following hospital d/c.   Social Worker assessment / plan: Pt hospitalized from home with [facility] on 09/01/16. CSW following to assist with d/c planning. PT has recommended SNF at d/c. Palliative Care Team also following and supports plan for SNF with palliative services. CSW met with pt / daughter, Lars Pinks [phone], to review recommendations. Pt / daughter are in agreement with [facility] at d/c. SNF search initiated and bed offers are pending. CSW will continue to follow to assist with d/c planning needs.  Employment status:  Retired Nurse, adult PT Recommendations:  [place] / Referral to community resources:  [place]  Patient/Family's Response to care:  Pt / family are in agreement with [facility].  Patient/Family's Understanding of and Emotional Response to Diagnosis, Current Treatment, and Prognosis:  Pt / daughter are aware of  pt's medical status. Palliative has been working with pt / family on [place] / providing support. Pt is apprehensive about going to rehab . " I've never been to rehab. I want to go home but I know I'm too weak. " Support / reassurance provided.  Emotional Assessment Appearance:  Appears stated age Attitude/Demeanor/Rapport:  Other (cooperative) Affect (typically observed):  Apprehensive, Quiet Orientation:  Oriented to Self, Oriented to Place, Oriented to  Time, Oriented to Situation Alcohol / Substance use:  Not Applicable Psych involvement (Current and /or in the community):  No (Comment)  Discharge Needs  Concerns to be addressed:  Discharge Planning Concerns Readmission within the last 30 days:  No Current discharge risk:  None Barriers to Discharge:  No Barriers Identified   Luretha Rued, [place] 09/09/2016, 1:41 PM

## 2016-09-09 NOTE — Progress Notes (Signed)
PROGRESS NOTE    Cindy Byrd  NFA:[id number] DOB: 1941/08/11 DOA: 09/01/2016 PCP: Tamsen Roers, MD    Brief Narrative:   Cindy Durrett Toweryis a 75 y.o.femalewith metastatic non-small cell lung cancer under observation, hypertension, Barrett's esophagus, anemia and colitis secondary to Nivolumabwith persistent diarrhea presents to the ER because of chest pain.  She was found to have bilateral PE and a DVT. Patient also noted to have a pneumonia. She was started on lovenox. Meanwhile her creatinine started worsening , CT ruled out obstructive causes. Renal consulted. GI following.    Assessment & Plan:   Principal Problem:   Bilateral pulmonary embolism (HCC) Active Problems:   HCAP (healthcare-associated pneumonia)   ARF (acute renal failure) (HCC)   Diarrhea   DVT of lower extremity, bilateral (HCC)   Protein calorie malnutrition (HCC)   HTN (hypertension)   Metastatic lung cancer (metastasis from lung to other site) ([place])   Barrett's esophagus determined by biopsy   Nausea with vomiting   Hypokalemia   Pressure injury of skin   Cancer ([place])   Ulcerative pancolitis without complication ([place])   Palliative care by specialist   Goals of care, counseling/discussion   Advance care planning   Hypocalcemia   Vitamin D deficiency   Chest pain   New onset a-fib ([place])   Thrush of mouth and esophagus ([place])   Protein-calorie malnutrition, severe ([place])   Colitis determined by colorectal biopsy  #1 bilateral pulmonary emboli/bilateral DVT Likely secondary to metastatic lung cancer. Patient with some clinical improvement. CT angiogram chest 09/02/2016 with acute bilateral PE with evidence of right heart strain consistent at least with submassive PE. Bilateral lower extremity Dopplers for DVT in the right common femoral vein, right profunda femoris vein, right femoral vein, right popliteal vein, right posterior tibial vein,right peroneal vein, right gastrocnemius vein, left  posterior tibial vein, left peroneal vein and left gastrocnemius vein. Patient currently on full dose Lovenox for pharmacy. Will likely need lifelong anticoagulation with Lovenox. 2-D echo with a EF of 55-60% with no wall motion abnormalities. Right ventricular systolic function was normal. Will need outpatient follow-up with her hematologist/oncologist.  #2 healthcare associated pneumonia Patient is currently afebrile. Some clinical improvement. WBCs normalized. Blood cultures with no growth to date. Sputum Gram stain and culture pending. IV antibiotics have been discontinued patient received a dose of oral Vantin yesterday and has completed a seven-day course of antibiotic treatment. No further antibiotics needed at this time.   #3 hypokalemia Likely secondary to GI losses. Replete. Keep magnesium greater than 2.  #4 ARF Likely secondary to ATN from IV contrast from CT angiogram 09/02/2016 and also prerenal azotemia due to volume depletion from GI losses from diarrhea. Creatinine currently at 1.14 from 1.46 from 1.82. Patient has been seen in consultation by nephrology. IV fluids has been decreased to 100 mL per hour. Bicarbonate drip has been discontinued. Improvement with GI output. Appreciate nephrology input and recommendations.  #5 metabolic acidosis Likely secondary to acute renal failure and diarrhea. Improving with improved renal function. Bicarbonate drip has been discontinued. Appreciate renal input.   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 Patient has been off Nivolumab 3 months have a never took [place] as direct it. Patient with a decrease in amount of stool after being started on budesonide yesterday per GI. Questran was also added to bowel regimen. Patient with 200 mL over the past 24 hours stool. Rectal pouch fell out. Patient with 700 mL over  the past 12 hours (10/4-5) from 1.5 L (10/3-4).  C. difficile PCR negative. GI  pathogen panel negative. CT abdomen and pelvis with grossly stable metastases involving the left iliac bone with associated pathologic fracture. Fluid-filled small and large bowel without significant distention no wall thickening. Mild sigmoid diverticulosis. New patchy airspace opacities of both lung bases. No other evidence of metastatic disease within the abdomen and pelvis. Patient is being followed by GI and patient has been started on budesonide as well as question with improvement with diarrhea.  GI following and appreciate input and recommendations. IV fluids. Supportive care.  #7 hypocalcemia/vitamin D deficiency Questionable etiology. Likely secondary to vitamin D deficiency. Ionized calcium was 5.2. Corrected calcium is 6.92. Magnesium level at 2.2 after repletion. Vitamin D 25-hydroxy levels at 10.9. Will give a dose of IV calcium gluconate  2 g 1 due to worsening calcium and patient now in new onset A. Fib yesterday. Continue oral calcium with vitamin D. Keep magnesium greater than 2. Repleted phosphorus. Follow.  #8 transient new onset atrial fibrillation Likely secondary to electrolyte abnormalities of hypocalcemia, hypomagnesemia, hypophosphatemia and acute illness with bilateral PE, bilateral DVT, progressive diarrhea, acute renal failure. Patient converted back to normal sinus rhythm yesterday. Patient denies any chest pain or shortness of breath. TSH within normal limits at 2.016. 2-D echo with EF of 55-60%, no wall motion abnormalities, no valvular abnormalities 09/02/2016. Replete electrolytes. Already on anticoagulation secondary to problem #1. Follow.  #9 Barrett's esophagus with high-grade dysplasia Per GI. Patient will likely need [facility].  #10 metastatic non-small cell lung cancer with bilateral lung nodules and large metastatic lesion of the left ilium Patient is to follow-up with oncology, Dr. Irene Limbo as outpatient. Oncology has been notified via [place].  #11  protein calorie malnutrition Nutritional supplementation.    DVT prophylaxis: Full dose Lovenox Code Status: Full Family Communication: Updated patient. No family at bedside. Disposition Plan: Transfer to telemetry. When medically stable with resolution of diarrhea, resolution of acute renal failure and clinical improvement likely to skilled nursing facility with palliative care following.   Consultants:   Nephrology: Dr.Schertz 09/06/2016  Gastroenterology: Dr. Silverio Decamp 09/05/2016  Palliative care: Dr. Rowe Pavy 09/07/2016  Procedures:  CT angiogram chest 09/02/2016 CT abdomen and pelvis 09/04/2016 Chest x-ray 09/01/2016 2-D echo 09/02/2016 Lower extremity Dopplers 09/02/2016    Antimicrobials:   IV Azactam 09/01/2016>>> 09/03/2016  IV cefepime 09/03/2016>>>>>09/08/2016  IV vancomycin 09/01/2016>>>> 09/05/2016  Oral vantin 09/08/2016 x 1day   Subjective: Patient denies CP. No SOB. No emesis. No nausea. Some improvement with abdominal pain. Decreased stool output after being started on budesonide and questran per GI. Rectal tube fell out yesterday. Patient with a stool output of 200 mL over the past 24 hours.  Objective: Vitals:   09/09/16 0700 09/09/16 0748 09/09/16 0800 09/09/16 0900  BP:   (!) 128/48   Pulse: 88  86 93  Resp: '19  18 17  '$ Temp:  98 F (36.7 C)    TempSrc:  Oral    SpO2: 97%  94% 95%  Weight:      Height:        Intake/Output Summary (Last 24 hours) at 09/09/16 0937 Last data filed at 09/09/16 0800  Gross per 24 hour  Intake             4416 ml  Output             1925 ml  Net  2491 ml   Filed Weights   09/06/16 0424 09/07/16 0500 09/08/16 0458  Weight: 58.1 kg (128 lb 1.4 oz) 63 kg (138 lb 14.2 oz) 65.2 kg (143 lb 11.8 oz)    Examination:  General exam: Weak. Dry mucous membranes. Respiratory system: Clear to auscultation bilaterally. Respiratory effort normal. Cardiovascular system: RRR. No JVD, murmurs, rubs, gallops or  clicks. No pedal edema. Gastrointestinal system: Abdomen is nondistended, soft and less tender to palpation. No organomegaly or masses felt. Normal bowel sounds heard. Central nervous system: Alert and oriented. No focal neurological deficits. Extremities: Symmetric 5 x 5 power. Skin: No rashes, lesions or ulcers Psychiatry: Judgement and insight appear normal. Mood & affect appropriate.     Data Reviewed: I have personally reviewed following labs and imaging studies  CBC:  Recent Labs Lab 09/04/16 0426 09/08/16 0335 09/09/16 0400  WBC 8.3 2.7* 1.4*  HGB 12.2 9.8* 9.6*  HCT 35.8* 28.9* 27.8*  MCV 81.2 80.3 79.7  PLT 293 206 341   Basic Metabolic Panel:  Recent Labs Lab 09/04/16 1030 09/05/16 0435 09/06/16 0410 09/07/16 0408 09/07/16 1130 09/08/16 0335 09/08/16 1030 09/09/16 0400  NA  --  137 137 137  --  131*  --  133*  K  --  4.0 4.7 3.4*  --  2.2*  --  2.9*  CL  --  116* 122* 116*  --  103  --  101  CO2  --  14* 11* 14*  --  20*  --  20*  GLUCOSE  --  120* 86 72  --  70  --  97  BUN  --  35* 40* 43*  --  34*  --  21*  CREATININE  --  1.84* 2.00* 1.82*  --  1.46*  --  1.14*  CALCIUM  --  6.6* 5.9* 5.3* 5.2* 4.7*  --  5.0*  MG 1.7  --   --  1.9  --  1.5*  --  2.2  PHOS  --   --   --   --  1.7*  --  1.3* 3.0   GFR: Estimated Creatinine Clearance: 42.4 mL/min (by C-G formula based on SCr of 1.14 mg/dL (H)). Liver Function Tests:  Recent Labs Lab 09/05/16 0435 09/08/16 0335  AST 67* 51*  ALT 77* 42  ALKPHOS 64 79  BILITOT 0.5 1.0  PROT 5.8* 4.3*  ALBUMIN 2.1* 1.6*    Recent Labs Lab 09/05/16 0435  LIPASE 22   No results for input(s): AMMONIA in the last 168 hours. Coagulation Profile:  Recent Labs Lab 09/02/16 1030  INR 1.44   Cardiac Enzymes:  Recent Labs Lab 09/02/16 1030  TROPONINI <0.03   BNP (last 3 results) No results for input(s): PROBNP in the last 8760 hours. HbA1C: No results for input(s): HGBA1C in the last 72  hours. CBG: No results for input(s): GLUCAP in the last 168 hours. Lipid Profile: No results for input(s): CHOL, HDL, LDLCALC, TRIG, CHOLHDL, LDLDIRECT in the last 72 hours. Thyroid Function Tests:  Recent Labs  09/08/16 1030  TSH 2.016   Anemia Panel: No results for input(s): VITAMINB12, FOLATE, FERRITIN, TIBC, IRON, RETICCTPCT in the last 72 hours. Sepsis Labs: No results for input(s): PROCALCITON, LATICACIDVEN in the last 168 hours.  Recent Results (from the past 240 hour(s))  C difficile quick scan w PCR reflex     Status: None   Collection Time: 09/02/16  7:15 PM  Result Value Ref Range Status   C  Diff antigen NEGATIVE NEGATIVE Final   C Diff toxin NEGATIVE NEGATIVE Final   C Diff interpretation No C. difficile detected.  Final  Culture, blood (Routine X 2) w Reflex to ID Panel     Status: None (Preliminary result)   Collection Time: 09/03/16  6:05 PM  Result Value Ref Range Status   Specimen Description BLOOD RIGHT ARM  Final   Special Requests BOTTLES DRAWN AEROBIC AND ANAEROBIC 5CC  Final   Culture   Final    NO GROWTH 4 DAYS Performed at [facility]    Report Status PENDING  Incomplete  Culture, blood (Routine X 2) w Reflex to ID Panel     Status: None (Preliminary result)   Collection Time: 09/03/16  6:11 PM  Result Value Ref Range Status   Specimen Description BLOOD RIGHT ARM  Final   Special Requests BOTTLES DRAWN AEROBIC ONLY 5CC  Final   Culture   Final    NO GROWTH 4 DAYS Performed at [facility]    Report Status PENDING  Incomplete  Gastrointestinal Panel by PCR , Stool     Status: None   Collection Time: 09/06/16 10:44 AM  Result Value Ref Range Status   Campylobacter species NOT DETECTED NOT DETECTED Final   Plesimonas shigelloides NOT DETECTED NOT DETECTED Final   Salmonella species NOT DETECTED NOT DETECTED Final   Yersinia enterocolitica NOT DETECTED NOT DETECTED Final   Vibrio species NOT DETECTED NOT DETECTED Final   Vibrio  cholerae NOT DETECTED NOT DETECTED Final   Enteroaggregative E coli (EAEC) NOT DETECTED NOT DETECTED Final   Enteropathogenic E coli (EPEC) NOT DETECTED NOT DETECTED Final   Enterotoxigenic E coli (ETEC) NOT DETECTED NOT DETECTED Final   Shiga like toxin producing E coli (STEC) NOT DETECTED NOT DETECTED Final   Shigella/Enteroinvasive E coli (EIEC) NOT DETECTED NOT DETECTED Final   Cryptosporidium NOT DETECTED NOT DETECTED Final   Cyclospora cayetanensis NOT DETECTED NOT DETECTED Final   Entamoeba histolytica NOT DETECTED NOT DETECTED Final   Giardia lamblia NOT DETECTED NOT DETECTED Final   Adenovirus F40/41 NOT DETECTED NOT DETECTED Final   Astrovirus NOT DETECTED NOT DETECTED Final   Norovirus GI/GII NOT DETECTED NOT DETECTED Final   Rotavirus A NOT DETECTED NOT DETECTED Final   Sapovirus (I, II, IV, and V) NOT DETECTED NOT DETECTED Final         Radiology Studies: Ct Head Wo Contrast  Result Date: 09/07/2016 CLINICAL DATA:  Altered mental status, history of esophageal cancer, rule out metastasis EXAM: CT HEAD WITHOUT CONTRAST TECHNIQUE: Contiguous axial images were obtained from the base of the skull through the vertex without intravenous contrast. COMPARISON:  07/20/2015 CT FINDINGS: Brain: Sulcal prominence over the convexity of the brain consistent with superficial atrophy. No significant central atrophy. No focus of hemorrhage, midline shift or edema. Ventricles are not dilated. No acute infarction. Lack of IV contrast limits assessment for metastatic disease. Vascular: Minimal atherosclerosis of the cavernous internal carotids. Skull: No lytic or blastic disease.  No acute osseous abnormality. Sinuses/Orbits: Bilateral ocular lens surgical change. Mucosal thickening in the sphenoid and posterior ethmoid sinus. No mastoid effusion. Other: None IMPRESSION: Superficial atrophy. No acute intracranial abnormality. No findings of metastatic disease to the brain however lack of IV contrast  limits assessment. Electronically Signed   By: Ashley Royalty M.D.   On: 09/07/2016 13:48        Scheduled Meds: . amLODipine  5 mg Oral Daily  . budesonide  9 mg Oral Daily  . calcium gluconate  2 g Intravenous Once  . calcium-vitamin D  2 tablet Oral TID  . chlorhexidine  15 mL Mouth Rinse BID  . cholestyramine  4 g Oral 2 times per day  . citalopram  20 mg Oral Daily  . dexamethasone  4 mg Intravenous Q12H  . enoxaparin (LOVENOX) injection  60 mg Subcutaneous Q12H  . famotidine (PEPCID) IV  20 mg Intravenous Q24H  . feeding supplement  1 Container Oral BID BM  . feeding supplement (PRO-STAT SUGAR FREE 64)  30 mL Oral Daily  . lactobacillus acidophilus & bulgar  1 tablet Oral TID WC  . magic mouthwash  5 mL Oral TID  . mouth rinse  15 mL Mouth Rinse q12n4p  . mesalamine  4.8 g Oral Q breakfast  . ondansetron (ZOFRAN) IV  8 mg Intravenous Q8H  . potassium chloride  40 mEq Oral Q4H   Continuous Infusions: .  sodium bicarbonate infusion 1/4 NS 1000 mL 100 mL/hr at 09/09/16 0800     LOS: 7 days    Time spent: 63 mins    Mykai Wendorf, MD [facility] Pager [phone]  If 7PM-7AM, please contact night-coverage [email or web address] Password [email or web address] 09/09/2016, 9:37 AM

## 2016-09-09 NOTE — Progress Notes (Addendum)
Daily Progress Note   Patient Name: Cindy Byrd       Date: 09/09/2016 DOB: July 02, 1941  Age: 75 y.o. MRN#: [id number] Attending Physician: Eugenie Filler, MD Primary Care Physician: Tamsen Roers, MD Admit Date: 09/01/2016  Reason for Consultation/Follow-up: Establishing goals of care  Subjective: Patient is awake alert oriented resting in bed this morning. She has a flat affect. She denies any abdominal pain. Her diarrhea is significantly better. She had one bowel movement early this morning. Rectal tube is discontinued. GI is following.  Patient wishes to elect her daughter Lars Pinks to be her designated healthcare power of attorney agent. Chaplain consult placed to facilitate paperwork.   Remains on oral and IV potassium replacement. Serum creatinine is improved.  Patient complains of hospital food, complains of having minimal-nil appetite. Denies any pain denies any nausea currently. complains of generalized weakness.  Palliative performance scale: 40%  Review of Systems  Constitutional: Negative for malaise/fatigue.  HENT: Negative for sore throat.   Eyes: Negative.   Respiratory: Negative.   Cardiovascular: Negative.   Gastrointestinal: Negative.   Genitourinary: Negative.   Musculoskeletal: Negative.   Skin: Negative.   Neurological: Positive for weakness.  Endo/Heme/Allergies: Negative.   Psychiatric/Behavioral: Negative.     Length of Stay: 7  Current Medications: Scheduled Meds:  . amLODipine  5 mg Oral Daily  . budesonide  9 mg Oral Daily  . calcium gluconate  2 g Intravenous Once  . calcium-vitamin D  2 tablet Oral TID  . chlorhexidine  15 mL Mouth Rinse BID  . cholestyramine  4 g Oral 2 times per day  . citalopram  20 mg Oral Daily  . dexamethasone  4 mg  Intravenous Q12H  . enoxaparin (LOVENOX) injection  60 mg Subcutaneous Q12H  . famotidine  20 mg Oral Daily  . feeding supplement  1 Container Oral BID BM  . feeding supplement (PRO-STAT SUGAR FREE 64)  30 mL Oral Daily  . lactobacillus acidophilus & bulgar  1 tablet Oral TID WC  . magic mouthwash  5 mL Oral TID  . mouth rinse  15 mL Mouth Rinse q12n4p  . mesalamine  4.8 g Oral Q breakfast  . potassium chloride  40 mEq Oral Q4H    Continuous Infusions: .  sodium bicarbonate infusion 1/4 NS 1000 mL 100 mL/hr  at 09/09/16 0800    PRN Meds: acetaminophen **OR** acetaminophen, diphenoxylate-atropine, hydrALAZINE, LORazepam, [DISCONTINUED] ondansetron **OR** ondansetron (ZOFRAN) IV, oxyCODONE-acetaminophen, promethazine, sodium chloride flush, zolpidem  Physical Exam  Constitutional: She is oriented to person, place, and time. She appears well-developed.  Cardiovascular: Normal rate and regular rhythm.   Pulmonary/Chest: Effort normal and breath sounds normal.  Abdominal: Soft. Bowel sounds are normal.     Musculoskeletal:  Generalized weakness  Neurological: She is alert and oriented to person, place, and time. No cranial nerve deficit.  Skin: Skin is warm and dry. No pallor.            Vital Signs: BP (!) 128/48 (BP Location: Right Arm)   Pulse 93   Temp 98 F (36.7 C) (Oral)   Resp 17   Ht '5\' 8"'$  (1.727 m)   Wt 65.2 kg (143 lb 11.8 oz)   SpO2 95%   BMI 21.86 kg/m  SpO2: SpO2: 95 % O2 Device: O2 Device: Not Delivered O2 Flow Rate: O2 Flow Rate (L/min): 2 L/min  Intake/output summary:   Intake/Output Summary (Last 24 hours) at 09/09/16 1036 Last data filed at 09/09/16 0800  Gross per 24 hour  Intake             4037 ml  Output             1925 ml  Net             2112 ml   LBM: Last BM Date: 09/08/16 Baseline Weight: Weight: 60.2 kg (132 lb 11.5 oz) Most recent weight: Weight: 65.2 kg (143 lb 11.8 oz)       Palliative Assessment/Data: PPS: 20 %   Flowsheet  Rows   Flowsheet Row Most Recent Value  Intake Tab  Referral Department  Hospitalist  Unit at Time of Referral  Cardiac/Telemetry Unit  Palliative Care Primary Diagnosis  Cancer  Date Notified  09/06/16  Palliative Care Type  New Palliative care  Reason for referral  Clarify Goals of Care  Date of Admission  09/01/16  # of days IP prior to Palliative referral  5  Clinical Assessment  Psychosocial & Spiritual Assessment  Palliative Care Outcomes      Patient Active Problem List   Diagnosis Date Noted  . Hypocalcemia 09/08/2016  . Vitamin D deficiency 09/08/2016  . New onset a-fib ([place]) 09/08/2016  . Chest pain   . Thrush of mouth and esophagus ([place])   . Protein-calorie malnutrition, severe ([place])   . Colitis determined by colorectal biopsy   . Bilateral pulmonary embolism ([place]) 09/07/2016  . DVT of lower extremity, bilateral ([place]) 09/07/2016  . Palliative care by specialist   . Goals of care, counseling/discussion   . Advance care planning   . Diarrhea   . Ulcerative pancolitis without complication ([place])   . Cancer ([place])   . Pressure injury of skin 09/02/2016  . HCAP (healthcare-associated pneumonia) 09/01/2016  . ARF (acute renal failure) ([place]) 09/01/2016  . Nausea with vomiting 07/18/2016  . Dehydration 07/18/2016  . Hypokalemia 07/18/2016  . Hypoalbuminemia due to protein-calorie malnutrition ([place]) 07/18/2016  . Peripheral edema 07/18/2016  . Diarrhea due to drug 05/19/2016  . Port catheter in place 04/07/2016  . Nicotine addiction 01/19/2016  . Barrett's esophagus determined by biopsy 09/04/2015  . Gastritis   . Primary malignant neoplasm of lung metastatic to other site ([place]) 08/06/2015  . Esophageal reflux 08/04/2015  . Bone metastasis ([place]) 06/24/2015  . Neoplasm related pain 06/24/2015  .  Protein calorie malnutrition ([place]) 06/24/2015  . Anorexia 06/24/2015  . Heavy smoker (more than 20 cigarettes per day) 06/24/2015  . Depression 06/24/2015  . HTN  (hypertension) 06/24/2015    Palliative Care Assessment & Plan   Patient Profile: 75 y.o. female  with past medical history of lung cancer with bone mets, colitis Barrett's esophagus, hypertension, admitted on 09/01/2016 with pneumonia. Further workup has also revealed hypokalemia, bilateral PE's, and acute renal failure.     Assessment/Recommendations/Plan Agree with potassium replacement, physical therapy, probably okay to be moved out of the stepdown unit Chaplain consult for completing advanced directives Monitor pain and non-pain symptom management Recommend skilled nursing facility for rehabilitation attempt with palliative care following, this has been discussed with patient and daughter Delia they're in agreement.  Code Status:  DNR  Prognosis:   Guarded  Discharge Planning:  Recommend skilled nursing facility for rehabilitation attempt with palliative care.    Care plan was discussed with daugther- Delia over the phone, patient at the bedside, Dr. Grandville Silos primary attending   Thank you for allowing the Palliative Medicine Team to assist in the care of this patient.   Time In: 9 Time Out: 925 Total Time 25 Prolonged Time Billed No      Greater than 50%  of this time was spent counseling and coordinating care related to the above assessment and plan.  Loistine Chance MD [facility] [place] palliative medicine team [phone] Please contact Palliative Medicine Team phone at [phone] for questions and concerns.

## 2016-09-10 LAB — CBC WITH DIFFERENTIAL/PLATELET
Basophils Absolute: 0 10*3/uL (ref 0.0–0.1)
Basophils Relative: 0 %
EOS PCT: 0 %
Eosinophils Absolute: 0 10*3/uL (ref 0.0–0.7)
HCT: 26 % — ABNORMAL LOW (ref 36.0–46.0)
Hemoglobin: 8.8 g/dL — ABNORMAL LOW (ref 12.0–15.0)
LYMPHS ABS: 0.5 10*3/uL — AB (ref 0.7–4.0)
LYMPHS PCT: 30 %
MCH: 27.5 pg (ref 26.0–34.0)
MCHC: 33.8 g/dL (ref 30.0–36.0)
MCV: 81.3 fL (ref 78.0–100.0)
MONO ABS: 0.5 10*3/uL (ref 0.1–1.0)
MONOS PCT: 30 %
Neutro Abs: 0.7 10*3/uL — ABNORMAL LOW (ref 1.7–7.7)
Neutrophils Relative %: 41 %
PLATELETS: 240 10*3/uL (ref 150–400)
RBC: 3.2 MIL/uL — AB (ref 3.87–5.11)
RDW: 15.5 % (ref 11.5–15.5)
WBC: 1.6 10*3/uL — ABNORMAL LOW (ref 4.0–10.5)

## 2016-09-10 LAB — COMPREHENSIVE METABOLIC PANEL
ALBUMIN: 1.8 g/dL — AB (ref 3.5–5.0)
ALT: 31 U/L (ref 14–54)
AST: 36 U/L (ref 15–41)
Alkaline Phosphatase: 84 U/L (ref 38–126)
Anion gap: 6 (ref 5–15)
BUN: 14 mg/dL (ref 6–20)
CHLORIDE: 105 mmol/L (ref 101–111)
CO2: 25 mmol/L (ref 22–32)
Calcium: 5.6 mg/dL — CL (ref 8.9–10.3)
Creatinine, Ser: 0.83 mg/dL (ref 0.44–1.00)
GFR calc Af Amer: >60 mL/min (ref >60)
GFR calc non Af Amer: >60 mL/min (ref >60)
GLUCOSE: 127 mg/dL — AB (ref 65–99)
POTASSIUM: 3 mmol/L — AB (ref 3.5–5.1)
Sodium: 136 mmol/L (ref 135–145)
Total Bilirubin: 0.7 mg/dL (ref 0.3–1.2)
Total Protein: 4.8 g/dL — ABNORMAL LOW (ref 6.5–8.1)

## 2016-09-10 LAB — PHOSPHORUS: Phosphorus: 1.3 mg/dL — ABNORMAL LOW (ref 2.5–4.6)

## 2016-09-10 LAB — MAGNESIUM: Magnesium: 1.5 mg/dL — ABNORMAL LOW (ref 1.7–2.4)

## 2016-09-10 MED ORDER — ERGOCALCIFEROL 8000 UNIT/ML PO SOLN
4000.0000 [IU] | Freq: Every day | ORAL | Status: DC
  Administered 2016-09-10 – 2016-09-13 (×4): 4000 [IU] via ORAL
  Filled 2016-09-10 (×4): qty 0.5

## 2016-09-10 MED ORDER — SODIUM PHOSPHATES 45 MMOLE/15ML IV SOLN: 20.0000 mmol | Freq: Once | INTRAVENOUS | Status: DC

## 2016-09-10 MED ORDER — MAGNESIUM OXIDE 400 (241.3 MG) MG PO TABS
400.0000 mg | ORAL_TABLET | Freq: Two times a day (BID) | ORAL | Status: DC
  Administered 2016-09-10 – 2016-09-13 (×7): 400 mg via ORAL
  Filled 2016-09-10 (×7): qty 1

## 2016-09-10 MED ORDER — MAGNESIUM SULFATE 4 GM/100ML IV SOLN
4.0000 g | Freq: Once | INTRAVENOUS | Status: AC
  Administered 2016-09-10: 4 g via INTRAVENOUS
  Filled 2016-09-10: qty 100

## 2016-09-10 MED ORDER — DIPHENHYDRAMINE HCL 50 MG/ML IJ SOLN: 12.5000 mg | Freq: Four times a day (QID) | INTRAMUSCULAR | Status: DC | PRN

## 2016-09-10 MED ORDER — SODIUM CHLORIDE 0.9 % IV SOLN
1.0000 g | Freq: Once | INTRAVENOUS | Status: AC
  Administered 2016-09-10: 1 g via INTRAVENOUS
  Filled 2016-09-10: qty 10

## 2016-09-10 MED ORDER — CHOLECALCIFEROL 400 UNIT/ML PO LIQD
4000.0000 [IU] | Freq: Every day | ORAL | Status: DC
  Filled 2016-09-10: qty 10

## 2016-09-10 MED ORDER — POTASSIUM PHOSPHATES 15 MMOLE/5ML IV SOLN
20.0000 mmol | Freq: Once | INTRAVENOUS | Status: AC
  Administered 2016-09-10: 20 mmol via INTRAVENOUS
  Filled 2016-09-10: qty 6.67

## 2016-09-10 MED ORDER — LORAZEPAM 2 MG/ML IJ SOLN: 2.0000 mg | INTRAMUSCULAR | Status: DC | PRN

## 2016-09-10 MED ORDER — POTASSIUM CHLORIDE CRYS ER 20 MEQ PO TBCR
40.0000 meq | EXTENDED_RELEASE_TABLET | ORAL | Status: AC
  Administered 2016-09-10 (×2): 40 meq via ORAL
  Filled 2016-09-10 (×2): qty 2

## 2016-09-10 MED ORDER — LORAZEPAM 1 MG PO TABS
1.0000 mg | ORAL_TABLET | Freq: Three times a day (TID) | ORAL | Status: DC | PRN
  Administered 2016-09-10 – 2016-09-12 (×5): 1 mg via ORAL
  Filled 2016-09-10 (×5): qty 1

## 2016-09-10 MED ORDER — ZINC OXIDE 40 % EX OINT
TOPICAL_OINTMENT | Freq: Every day | CUTANEOUS | Status: DC
  Administered 2016-09-10 – 2016-09-13 (×4): via TOPICAL
  Filled 2016-09-10 (×2): qty 114

## 2016-09-10 NOTE — Progress Notes (Signed)
[facility] for Phosphate Replacement Indication: Hypophosphatemia  Allergies  Allergen Reactions  . Penicillins Other (See Comments)    Unknown; childhood allergy  . Remeron [Mirtazapine] Other (See Comments)    nightmares  . Latex Rash    Patient Measurements: Height: '5\' 8"'$  (172.7 cm) Weight: 139 lb 8.8 oz (63.3 kg) IBW/kg (Calculated) : 63.9  Vital Signs: Temp: 98.7 F (37.1 C) (10/06 2153) Temp Source: Oral (10/06 2153) BP: 133/65 (10/06 2153) Pulse Rate: 79 (10/06 2153) Intake/Output from previous day: 10/06 0701 - 10/07 0700 In: 2735.7 [I.V.:2681.7; IV Piggyback:54] Out: 6967 [Urine:1650] Intake/Output from this shift: No intake/output data recorded.  Labs:  Recent Labs  09/08/16 0335 09/08/16 1030 09/09/16 0400 09/09/16 1430 09/10/16 0415  WBC 2.7*  --  1.4*  --  1.6*  HGB 9.8*  --  9.6*  --  8.8*  HCT 28.9*  --  27.8*  --  26.0*  PLT 206  --  229  --  240  CREATININE 1.46*  --  1.14* 1.01* 0.83  MG 1.5*  --  2.2  --  1.5*  PHOS  --  1.3* 3.0  --  1.3*  ALBUMIN 1.6*  --   --   --  1.8*  PROT 4.3*  --   --   --  4.8*  AST 51*  --   --   --  36  ALT 42  --   --   --  31  ALKPHOS 79  --   --   --  84  BILITOT 1.0  --   --   --  0.7   Estimated Creatinine Clearance: 57.6 mL/min (by C-G formula based on SCr of 0.83 mg/dL).   Assessment: 19 y/oF presented to ED on 09/01/16 with chest pain. 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 Patient currently not receiving any chemotherapy but is receiving Xgeva for bone mets. Patient known to pharmacy for antibiotic dosing for HCAP and lovenox dosing for PE. Pharmacy now consulted to assist with phosphate replacement. Of note, patient now with AKI likely secondary to ATN from IV contrast and prerenal azotemia due to volume depletion secondary to high output diarrhea.    Today, 09/10/16: - SCr: 0.83 (improving) - K+: 3.0 - TRH  PO replacement - Mag 1.5 - TRH PO and IV replacement - Corr Ca = 7.36 -  TRH IV and PO replacement - Na = 136  - Phos: 1.3 mg/dL   Plan:   Due to improved Na and continued low potassium give potassium  phosphate 20 mmol IV x 1   Repeat Phos in am   Dolly Rias RPh 09/10/2016, 8:59 AM Pager [phone]

## 2016-09-10 NOTE — Progress Notes (Signed)
Physical Therapy Treatment Patient Details Name: Cindy Byrd MRN: [id number] DOB: [birth year] Today's Date: 2016/09/14    History of Present Illness Cindy Byrd is a 75 y.o. female with metastatic non-small cell lung cancer under observation, hypertension, Barrett's esophagus, anemia and colitis  with persistent diarrhea presents to the ER 09/01/16 because of chest pain. Patient has been having pleuritic type of chest pain  CT angio reveals  acute bilateral PE and R leg DVT on 09/02/16.    PT Comments    Limited progress d/t bowel issues; continue to recommend SNF   Follow Up Recommendations  SNF     Equipment Recommendations  None recommended by PT    Recommendations for Other Services       Precautions / Restrictions Precautions Precautions: Fall Precaution Comments: diarrhea--constant Restrictions Weight Bearing Restrictions: No    Mobility  Bed Mobility Overal bed mobility: Needs Assistance Bed Mobility: Supine to Sit     Supine to sit: Min guard     General bed mobility comments: Increased time. close guard for safety. Pt c/o dizziness.   Transfers Overall transfer level: Needs assistance Equipment used: Rolling walker (2 wheeled) Transfers: Sit to/from [facility] Sit to Stand: Min assist;Min guard Stand pivot transfers: Min guard;Min assist       General transfer comment: sit to stand x 2; min to min/guard for safety   Ambulation/Gait             General Gait Details: unable d/t sever diarrhea   Stairs            Wheelchair Mobility    Modified Rankin (Stroke Patients Only)       Balance                                    Cognition Arousal/Alertness: Awake/alert Behavior During Therapy: WFL for tasks assessed/performed Overall Cognitive Status: Within Functional Limits for tasks assessed                      Exercises      General Comments        Pertinent Vitals/Pain Pain  Assessment: No/denies pain    Home Living                      Prior Function            PT Goals (current goals can now be found in the care plan section) Acute Rehab PT Goals Patient Stated Goal: to go home PT Goal Formulation: With patient Time For Goal Achievement: 09/18/16 Potential to Achieve Goals: Good Progress towards PT goals: Progressing toward goals    Frequency    Min 3X/week      PT Plan Current plan remains appropriate    Co-evaluation             End of Session Equipment Utilized During Treatment: Gait belt   Patient left: in chair;with call bell/phone within reach;with chair alarm set;with family/visitor present     Time: 4970-2637 PT Time Calculation (min) (ACUTE ONLY): 29 min  Charges:  $Therapeutic Activity: 23-37 mins                    G Codes:      Cindy Byrd 09-14-2016, 4:03 PM

## 2016-09-10 NOTE — Progress Notes (Signed)
CRITICAL VALUE ALERT  Critical value received:  Calcium 5.6  Date of notification:  09/10/16  Time of notification: 7681  Critical value read back:Yes.    Nurse who received alert:  Reynold Bowen, RN  MD notified (1st page):  Walden Field  Time of first page:  [phone]  MD notified (2nd page):  Time of second page:  Responding MD:  Walden Field  Time MD responded:  Orders put in for calcium gluconate

## 2016-09-10 NOTE — Progress Notes (Signed)
PROGRESS NOTE    Cindy Byrd  DGU:[id number] DOB: [birth year] DOA: 09/01/2016 PCP: Cindy Roers, MD    Brief Narrative:   Cindy Corrigan Toweryis a 75 y.o.femalewith metastatic non-small cell lung cancer under observation, hypertension, Barrett's esophagus, anemia and colitis secondary to Nivolumabwith persistent diarrhea presents to the ER because of chest pain.  She was found to have bilateral PE and a DVT. Patient also noted to have a pneumonia. She was started on lovenox. Meanwhile her creatinine started worsening , CT ruled out obstructive causes. Renal consulted. GI following.    Assessment & Plan:   Principal Problem:   Bilateral pulmonary embolism (HCC) Active Problems:   HCAP (healthcare-associated pneumonia)   ARF (acute renal failure) (HCC)   Diarrhea   DVT of lower extremity, bilateral (HCC)   Protein calorie malnutrition (HCC)   HTN (hypertension)   Primary malignant neoplasm of lung metastatic to other site ([name])   Barrett's esophagus determined by biopsy   Nausea with vomiting   Hypokalemia   Pressure injury of skin   Cancer ([name])   Ulcerative pancolitis without complication ([name])   Palliative care by specialist   Goals of care, counseling/discussion   Advance care planning   Hypocalcemia   Vitamin D deficiency   Chest pain   New onset a-fib ([name])   Thrush of mouth and esophagus ([name])   Protein-calorie malnutrition, severe ([name])   Colitis determined by colorectal biopsy  #1 bilateral pulmonary emboli/bilateral DVT Likely secondary to metastatic lung cancer. Patient with some clinical improvement. CT angiogram chest 09/02/2016 with acute bilateral PE with evidence of right heart strain consistent at least with submassive PE. Bilateral lower extremity Dopplers for DVT in the right common femoral vein, right profunda femoris vein, right femoral vein, right popliteal vein, right posterior tibial vein,right peroneal vein, right gastrocnemius vein, left  posterior tibial vein, left peroneal vein and left gastrocnemius vein. Patient currently on full dose Lovenox for pharmacy. Will likely need lifelong anticoagulation with Lovenox. 2-D echo with a EF of 55-60% with no wall motion abnormalities. Right ventricular systolic function was normal. Will need outpatient follow-up with her hematologist/oncologist.  #2 healthcare associated pneumonia Patient is currently afebrile. Some clinical improvement. WBCs normalized. Blood cultures with no growth to date. Sputum Gram stain and culture pending. IV antibiotics have been discontinued patient received a dose of oral Vantin and has completed a seven-day course of antibiotic treatment. No further antibiotics needed at this time.   #3 hypokalemia/hypophosphatemia Likely secondary to GI losses. Replete. Keep magnesium greater than 2.  #4 ARF Likely secondary to ATN from IV contrast from CT angiogram 09/02/2016 and also prerenal azotemia due to volume depletion from GI losses from diarrhea. Creatinine currently at 0.83 from 1.14 from 1.46 from 1.82. Patient has been seen in consultation by nephrology. IV fluids has been decreased to 100 mL per hour. Bicarbonate drip has been discontinued. Improvement with GI output. Appreciate nephrology input and recommendations.  #5 metabolic acidosis Likely secondary to acute renal failure and diarrhea. Improving with improved renal function. Bicarbonate drip has been discontinued. Appreciate renal input.   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 Patient has been off Nivolumab 3 months have a never took Chad as direct it. Patient with a decrease in amount of stool after being started on budesonide yesterday per GI. Questran was also added to bowel regimen. Patient with 200 mL over the past 24 hours stool. Rectal pouch fell  out. Patient with 700 mL over the past 12 hours  (10/4-5) from 1.5 L (10/3-4).  C. difficile PCR negative. GI pathogen panel negative. CT abdomen and pelvis with grossly stable metastases involving the left iliac bone with associated pathologic fracture. Fluid-filled small and large bowel without significant distention no wall thickening. Mild sigmoid diverticulosis. New patchy airspace opacities of both lung bases. No other evidence of metastatic disease within the abdomen and pelvis. Patient is being followed by GI and patient has been started on budesonide as well as questran with improvement with diarrhea. Patient however states having multiple loose stools and does not want a rectal tube placed back. Patient on Lactinex, Lialda, and IV Decadron.  GI following and appreciate input and recommendations. IV fluids. Supportive care.  #7 hypocalcemia/vitamin D deficiency Questionable etiology. Likely secondary to vitamin D deficiency and xgeva. Ionized calcium was 5.2. Corrected calcium is 7.36 today. Magnesium level at 1.5 after repletion. Vitamin D 25-hydroxy levels at 10.9. Will give a dose of IV calcium gluconate  2 g 1 due to worsening calcium and patient now in new onset A. Fib. Continue oral calcium with vitamin D. Keep magnesium greater than 2. Replete phosphorus. Vitamin D doses have been increased to 4000 units daily per oncology. Follow.  #8 transient new onset atrial fibrillation Likely secondary to electrolyte abnormalities of hypocalcemia, hypomagnesemia, hypophosphatemia and acute illness with bilateral PE, bilateral DVT, progressive diarrhea, acute renal failure. Patient converted back to normal sinus rhythm yesterday. Patient denies any chest pain or shortness of breath. TSH within normal limits at 2.016. 2-D echo with EF of 55-60%, no wall motion abnormalities, no valvular abnormalities 09/02/2016. Replete electrolytes. Already on anticoagulation secondary to problem #1. Follow.  #9 Barrett's esophagus with high-grade dysplasia Per GI.  Patient will likely need to follow[facility] with [facility].  #10 metastatic non-small cell lung cancer with bilateral lung nodules and large metastatic lesion of the left ilium Patient is to follow-up with oncology, Dr. Irene Limbo as outpatient. Oncology has been notified via Epic and ff.  #11 protein calorie malnutrition Nutritional supplementation.    DVT prophylaxis: Full dose Lovenox Code Status: Full Family Communication: Updated patient. No family at bedside. Disposition Plan: Transfer to telemetry. When medically stable with resolution of diarrhea, resolution of acute renal failure and clinical improvement likely to skilled nursing facility with palliative care following.   Consultants:   Nephrology: Dr.Schertz 09/06/2016  Gastroenterology: Dr. Silverio Decamp 09/05/2016  Palliative care: Dr. Rowe Pavy 09/07/2016  Procedures:  CT angiogram chest 09/02/2016 CT abdomen and pelvis 09/04/2016 Chest x-ray 09/01/2016 2-D echo 09/02/2016 Lower extremity Dopplers 09/02/2016    Antimicrobials:   IV Azactam 09/01/2016>>> 09/03/2016  IV cefepime 09/03/2016>>>>>09/08/2016  IV vancomycin 09/01/2016>>>> 09/05/2016  Oral vantin 09/08/2016 x 1day   Subjective: Patient denies CP. No SOB. No emesis. No nausea. No abdominal pain. Patient states still with multiple loose stools. Has had 2 loose stools today. Decreased stool output after being started on budesonide and questran per GI.   Objective: Vitals:   09/09/16 1200 09/09/16 1300 09/09/16 1400 09/09/16 2153  BP: (!) 133/114   133/65  Pulse: 87 93 85 79  Resp: '19 12  18  '$ Temp:   98.4 F (36.9 C) 98.7 F (37.1 C)  TempSrc:   Oral Oral  SpO2: 97% 97% 99% 97%  Weight:   66.1 kg (145 lb 11.2 oz) 63.3 kg (139 lb 8.8 oz)  Height:   '5\' 8"'$  (1.727 m)     Intake/Output Summary (Last 24 hours) at  09/10/16 1039 Last data filed at 09/10/16 0649  Gross per 24 hour  Intake          2281.67 ml  Output             1550 ml  Net           731.67 ml   Filed  Weights   09/08/16 0458 09/09/16 1400 09/09/16 2153  Weight: 65.2 kg (143 lb 11.8 oz) 66.1 kg (145 lb 11.2 oz) 63.3 kg (139 lb 8.8 oz)    Examination:  General exam: Weak. Dry mucous membranes. Respiratory system: Clear to auscultation bilaterally. Respiratory effort normal. Cardiovascular system: RRR. No JVD, murmurs, rubs, gallops or clicks. No pedal edema. Gastrointestinal system: Abdomen is nondistended, soft and less tender to palpation. No organomegaly or masses felt. Normal bowel sounds heard. Central nervous system: Alert and oriented. No focal neurological deficits. Extremities: Symmetric 5 x 5 power. Skin: No rashes, lesions or ulcers Psychiatry: Judgement and insight appear normal. Mood & affect appropriate.     Data Reviewed: I have personally reviewed following labs and imaging studies  CBC:  Recent Labs Lab 09/04/16 0426 09/08/16 0335 09/09/16 0400 09/10/16 0415  WBC 8.3 2.7* 1.4* 1.6*  NEUTROABS  --   --   --  0.7*  HGB 12.2 9.8* 9.6* 8.8*  HCT 35.8* 28.9* 27.8* 26.0*  MCV 81.2 80.3 79.7 81.3  PLT 293 206 229 829   Basic Metabolic Panel:  Recent Labs Lab 09/04/16 1030  09/07/16 0408 09/07/16 1130 09/08/16 0335 09/08/16 1030 09/09/16 0400 09/09/16 1430 09/10/16 0415  NA  --   < > 137  --  131*  --  133* 133* 136  K  --   < > 3.4*  --  2.2*  --  2.9* 2.6* 3.0*  CL  --   < > 116*  --  103  --  101 101 105  CO2  --   < > 14*  --  20*  --  20* 22 25  GLUCOSE  --   < > 72  --  70  --  97 104* 127*  BUN  --   < > 43*  --  34*  --  21* 19 14  CREATININE  --   < > 1.82*  --  1.46*  --  1.14* 1.01* 0.83  CALCIUM  --   < > 5.3* 5.2* 4.7*  --  5.0* 4.9* 5.6*  MG 1.7  --  1.9  --  1.5*  --  2.2  --  1.5*  PHOS  --   --   --  1.7*  --  1.3* 3.0  --  1.3*  < > = values in this interval not displayed. GFR: Estimated Creatinine Clearance: 57.6 mL/min (by C-G formula based on SCr of 0.83 mg/dL). Liver Function Tests:  Recent Labs Lab 09/05/16 0435  09/08/16 0335 09/10/16 0415  AST 67* 51* 36  ALT 77* 42 31  ALKPHOS 64 79 84  BILITOT 0.5 1.0 0.7  PROT 5.8* 4.3* 4.8*  ALBUMIN 2.1* 1.6* 1.8*    Recent Labs Lab 09/05/16 0435  LIPASE 22   No results for input(s): AMMONIA in the last 168 hours. Coagulation Profile: No results for input(s): INR, PROTIME in the last 168 hours. Cardiac Enzymes: No results for input(s): CKTOTAL, CKMB, CKMBINDEX, TROPONINI in the last 168 hours. BNP (last 3 results) No results for input(s): PROBNP in the last 8760 hours. HbA1C: No results for input(s):  HGBA1C in the last 72 hours. CBG: No results for input(s): GLUCAP in the last 168 hours. Lipid Profile: No results for input(s): CHOL, HDL, LDLCALC, TRIG, CHOLHDL, LDLDIRECT in the last 72 hours. Thyroid Function Tests:  Recent Labs  09/08/16 1030  TSH 2.016   Anemia Panel: No results for input(s): VITAMINB12, FOLATE, FERRITIN, TIBC, IRON, RETICCTPCT in the last 72 hours. Sepsis Labs: No results for input(s): PROCALCITON, LATICACIDVEN in the last 168 hours.  Recent Results (from the past 240 hour(s))  C difficile quick scan w PCR reflex     Status: None   Collection Time: 09/02/16  7:15 PM  Result Value Ref Range Status   C Diff antigen NEGATIVE NEGATIVE Final   C Diff toxin NEGATIVE NEGATIVE Final   C Diff interpretation No C. difficile detected.  Final  Culture, blood (Routine X 2) w Reflex to ID Panel     Status: None   Collection Time: 09/03/16  6:05 PM  Result Value Ref Range Status   Specimen Description BLOOD RIGHT ARM  Final   Special Requests BOTTLES DRAWN AEROBIC AND ANAEROBIC 5CC  Final   Culture   Final    NO GROWTH 5 DAYS Performed at [facility]    Report Status 09/09/2016 FINAL  Final  Culture, blood (Routine X 2) w Reflex to ID Panel     Status: None   Collection Time: 09/03/16  6:11 PM  Result Value Ref Range Status   Specimen Description BLOOD RIGHT ARM  Final   Special Requests BOTTLES DRAWN AEROBIC  ONLY 5CC  Final   Culture   Final    NO GROWTH 5 DAYS Performed at [facility]    Report Status 09/09/2016 FINAL  Final  Gastrointestinal Panel by PCR , Stool     Status: None   Collection Time: 09/06/16 10:44 AM  Result Value Ref Range Status   Campylobacter species NOT DETECTED NOT DETECTED Final   Plesimonas shigelloides NOT DETECTED NOT DETECTED Final   Salmonella species NOT DETECTED NOT DETECTED Final   Yersinia enterocolitica NOT DETECTED NOT DETECTED Final   Vibrio species NOT DETECTED NOT DETECTED Final   Vibrio cholerae NOT DETECTED NOT DETECTED Final   Enteroaggregative E coli (EAEC) NOT DETECTED NOT DETECTED Final   Enteropathogenic E coli (EPEC) NOT DETECTED NOT DETECTED Final   Enterotoxigenic E coli (ETEC) NOT DETECTED NOT DETECTED Final   Shiga like toxin producing E coli (STEC) NOT DETECTED NOT DETECTED Final   Shigella/Enteroinvasive E coli (EIEC) NOT DETECTED NOT DETECTED Final   Cryptosporidium NOT DETECTED NOT DETECTED Final   Cyclospora cayetanensis NOT DETECTED NOT DETECTED Final   Entamoeba histolytica NOT DETECTED NOT DETECTED Final   Giardia lamblia NOT DETECTED NOT DETECTED Final   Adenovirus F40/41 NOT DETECTED NOT DETECTED Final   Astrovirus NOT DETECTED NOT DETECTED Final   Norovirus GI/GII NOT DETECTED NOT DETECTED Final   Rotavirus A NOT DETECTED NOT DETECTED Final   Sapovirus (I, II, IV, and V) NOT DETECTED NOT DETECTED Final         Radiology Studies: No results found.      Scheduled Meds: . amLODipine  5 mg Oral Daily  . budesonide  9 mg Oral Daily  . calcium-vitamin D  2 tablet Oral TID  . chlorhexidine  15 mL Mouth Rinse BID  . cholestyramine  4 g Oral 2 times per day  . citalopram  20 mg Oral Daily  . dexamethasone  4 mg Intravenous Q12H  .  enoxaparin (LOVENOX) injection  60 mg Subcutaneous Q12H  . ergocalciferol  4,000 Units Oral Daily  . famotidine  20 mg Oral Daily  . feeding supplement (PEDIASURE PEPTIDE 1.0 CAL)   237 mL Oral TID BM  . lactobacillus acidophilus & bulgar  1 tablet Oral TID WC  . magic mouthwash  5 mL Oral TID  . magnesium oxide  400 mg Oral BID  . magnesium sulfate 1 - 4 g bolus IVPB  4 g Intravenous Once  . mouth rinse  15 mL Mouth Rinse q12n4p  . mesalamine  4.8 g Oral Q breakfast  . potassium chloride  40 mEq Oral Q4H  . potassium phosphate IVPB (mmol)  20 mmol Intravenous Once  . sodium chloride flush  10-40 mL Intracatheter Q12H   Continuous Infusions: .  sodium bicarbonate infusion 1/4 NS 1000 mL 100 mL/hr at 09/10/16 0956     LOS: 8 days    Time spent: 33 mins    THOMPSON,DANIEL, MD [facility] Pager [phone]  If 7PM-7AM, please contact night-coverage [email or web address] Password [email or web address] 09/10/2016, 10:39 AM

## 2016-09-11 LAB — BASIC METABOLIC PANEL
ANION GAP: 5 (ref 5–15)
BUN: 8 mg/dL (ref 6–20)
CALCIUM: 5.9 mg/dL — AB (ref 8.9–10.3)
CO2: 28 mmol/L (ref 22–32)
CREATININE: 0.56 mg/dL (ref 0.44–1.00)
Chloride: 104 mmol/L (ref 101–111)
GFR calc non Af Amer: >60 mL/min (ref >60)
Glucose, Bld: 112 mg/dL — ABNORMAL HIGH (ref 65–99)
Potassium: 3.2 mmol/L — ABNORMAL LOW (ref 3.5–5.1)
SODIUM: 137 mmol/L (ref 135–145)

## 2016-09-11 LAB — PHOSPHORUS: PHOSPHORUS: 1.4 mg/dL — AB (ref 2.5–4.6)

## 2016-09-11 LAB — MAGNESIUM: Magnesium: 1.6 mg/dL — ABNORMAL LOW (ref 1.7–2.4)

## 2016-09-11 MED ORDER — POTASSIUM CHLORIDE CRYS ER 20 MEQ PO TBCR
40.0000 meq | EXTENDED_RELEASE_TABLET | ORAL | Status: AC
  Administered 2016-09-11 (×2): 40 meq via ORAL
  Filled 2016-09-11 (×2): qty 2

## 2016-09-11 MED ORDER — SODIUM CHLORIDE 0.9 % IV SOLN
1.0000 g | Freq: Once | INTRAVENOUS | Status: AC
  Administered 2016-09-11: 1 g via INTRAVENOUS
  Filled 2016-09-11: qty 10

## 2016-09-11 MED ORDER — MAGNESIUM SULFATE 4 GM/100ML IV SOLN
4.0000 g | Freq: Once | INTRAVENOUS | Status: AC
  Administered 2016-09-11: 4 g via INTRAVENOUS
  Filled 2016-09-11: qty 100

## 2016-09-11 MED ORDER — POTASSIUM PHOSPHATES 15 MMOLE/5ML IV SOLN
20.0000 mmol | Freq: Once | INTRAVENOUS | Status: AC
  Administered 2016-09-11: 20 mmol via INTRAVENOUS
  Filled 2016-09-11: qty 6.67

## 2016-09-11 NOTE — Progress Notes (Signed)
CRITICAL VALUE ALERT  Critical value received:  Calcium 5.9  Date of notification:  09/11/16  Time of notification:  0651  Critical value read back:Yes.    Nurse who received alert:  Reynold Bowen, RN  MD notified (1st page):  [phone]  Time of first page:  KIm  MD notified (2nd page):  Time of second page:  Responding MD:  Maudie Mercury  Time MD responded:  [phone]

## 2016-09-11 NOTE — Progress Notes (Addendum)
[facility] ROUNDING NOTE   Subjective: Feeling better, wants to go home. She is frustrated that she is unable to move around and has to lay in bed all day.    Objective: Vital signs in last 24 hours: Temp:  [98.3 F (36.8 C)-98.6 F (37 C)] 98.6 F (37 C) (10/08 0615) Pulse Rate:  [71-76] 75 (10/08 0615) Resp:  [18-20] 18 (10/08 0615) BP: (118-132)/(52-67) 132/62 (10/08 0615) SpO2:  [96 %-99 %] 96 % (10/08 0615) Weight:  [138 lb 14.2 oz (63 kg)] 138 lb 14.2 oz (63 kg) (10/08 0500) Last BM Date: 09/11/16 General: NAD Abdomen: soft, non tender and non distended Ext: No edema    Intake/Output from previous day: 10/07 0701 - 10/08 0700 In: 2476.7 [P.O.:240; I.V.:1730; IV Piggyback:506.7] Out: 1000 [Urine:1000] Intake/Output this shift: No intake/output data recorded.   Lab Results:  Recent Labs  09/09/16 0400 09/10/16 0415  WBC 1.4* 1.6*  HGB 9.6* 8.8*  PLT 229 240  MCV 79.7 81.3   BMET  Recent Labs  09/09/16 1430 09/10/16 0415 09/11/16 0500  NA 133* 136 137  K 2.6* 3.0* 3.2*  CL 101 105 104  CO2 '22 25 28  '$ GLUCOSE 104* 127* 112*  BUN '19 14 8  '$ CREATININE 1.01* 0.83 0.56  CALCIUM 4.9* 5.6* 5.9*   LFT  Recent Labs  09/10/16 0415  PROT 4.8*  ALBUMIN 1.8*  AST 36  ALT 31  ALKPHOS 22  BILITOT 0.7    Assessment  & Plan  75 year old female with metastatic non-small cell lung cancer with severe diarrhea thought to be secondary to immune mediated colitis Diarrhea has significantly improved with steroids, hold off flex sig or endoscopic evaluation given significant improvement. Infectious work up negative. Fecal fat and elastase normal.   Continue budesonide 9 mg daily and IV Decadron Questran twice daily with meals Titrate Lomotil as needed to have one to 2 soft bowel movements Patient will need follow-up in office in next 2-3 weeks and will also need to discuss surveillance EGD at the time for Barrett's esophagus with high grade  dysplasia We'll sign off, please call with any questions     K. Denzil Magnuson , MD [phone] Mon-Fri 8a-5p [phone] after 5p, weekends, holidays [facility]

## 2016-09-11 NOTE — Progress Notes (Signed)
[facility] for Phosphate Replacement Indication: Hypophosphatemia  Allergies  Allergen Reactions  . Penicillins Other (See Comments)    Unknown; childhood allergy  . Remeron [Mirtazapine] Other (See Comments)    nightmares  . Latex Rash    Patient Measurements: Height: '5\' 8"'$  (172.7 cm) Weight: 138 lb 14.2 oz (63 kg) IBW/kg (Calculated) : 63.9  Vital Signs: Temp: 98.6 F (37 C) (10/08 0615) Temp Source: Oral (10/08 0615) BP: 132/62 (10/08 0615) Pulse Rate: 75 (10/08 0615) Intake/Output from previous day: 10/07 0701 - 10/08 0700 In: 2476.7 [P.O.:240; I.V.:1730; IV Piggyback:506.7] Out: 1000 [Urine:1000] Intake/Output from this shift: No intake/output data recorded.  Labs:  Recent Labs  09/09/16 0400 09/09/16 1430 09/10/16 0415 09/11/16 0500  WBC 1.4*  --  1.6*  --   HGB 9.6*  --  8.8*  --   HCT 27.8*  --  26.0*  --   PLT 229  --  240  --   CREATININE 1.14* 1.01* 0.83 0.56  MG 2.2  --  1.5* 1.6*  PHOS 3.0  --  1.3* 1.4*  ALBUMIN  --   --  1.8*  --   PROT  --   --  4.8*  --   AST  --   --  36  --   ALT  --   --  31  --   ALKPHOS  --   --  84  --   BILITOT  --   --  0.7  --    Estimated Creatinine Clearance: 59.5 mL/min (by C-G formula based on SCr of 0.56 mg/dL).   Assessment: 57 y/oF presented to ED on 09/01/16 with chest pain. 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 Patient currently not receiving any chemotherapy but is receiving Xgeva for bone mets. Patient known to pharmacy for antibiotic dosing for HCAP and lovenox dosing for PE. Pharmacy now consulted to assist with phosphate replacement. Of note, patient now with AKI likely secondary to ATN from IV contrast and prerenal azotemia due to volume depletion secondary to high output diarrhea.   Today, 09/11/16: - SCr: 0.53 (improving) - K+: 3.2 - TRH  PO  replacement - Mag 1.6 - TRH PO  replacement - Corr Ca = 7.66 -  TRH IV and PO replacement - Na = 137  - Phos: 1.4 mg/dL   Plan:   Due to improved Na and continued low potassium give potassium  phosphate 20 mmol IV x 1   Repeat Phos in am   Dolly Rias RPh 09/11/2016, 8:25 AM Pager [phone]

## 2016-09-11 NOTE — Progress Notes (Signed)
CSW assisting with d/c planning.  CSW followed up with pt at bedside and provided SNF bed offers. Pt states that she is interested in [facility]. [facility] has not yet responded from initial bed search. CSW notified pt that CSW can send message to [facility] to review referral to determine if facility can offer placement. CSW encouraged pt to review current bed offers in case [facility] unable to offer pt a bed. Pt expressed understanding.  CSW sent message to [facility] requesting facility to review referral.  Weekday CSW to follow up with decision for SNF.  Alison Murray, MSW, LCSW Clinical Social Worker Weekend Coverage  [phone]

## 2016-09-11 NOTE — Progress Notes (Signed)
PROGRESS NOTE    Cindy Byrd  RSW:[id number] DOB: 28-Apr-1941 DOA: 09/01/2016 PCP: Tamsen Roers, MD    Brief Narrative:   Cindy Feuerstein Toweryis a 75 y.o.femalewith metastatic non-small cell lung cancer under observation, hypertension, Barrett's esophagus, anemia and colitis secondary to Nivolumabwith persistent diarrhea presents to the ER because of chest pain.  She was found to have bilateral PE and a DVT. Patient also noted to have a pneumonia. She was started on lovenox. Meanwhile her creatinine started worsening , CT ruled out obstructive causes. Renal consulted. GI following.    Assessment & Plan:   Principal Problem:   Bilateral pulmonary embolism (HCC) Active Problems:   HCAP (healthcare-associated pneumonia)   ARF (acute renal failure) (HCC)   Diarrhea   DVT of lower extremity, bilateral (HCC)   Protein calorie malnutrition (HCC)   HTN (hypertension)   Primary malignant neoplasm of lung metastatic to other site ([place])   Barrett's esophagus determined by biopsy   Nausea with vomiting   Hypokalemia   Pressure injury of skin   Cancer ([place])   Ulcerative pancolitis without complication ([place])   Palliative care by specialist   Goals of care, counseling/discussion   Advance care planning   Hypocalcemia   Vitamin D deficiency   Chest pain   New onset a-fib ([place])   Thrush of mouth and esophagus ([place])   Protein-calorie malnutrition, severe ([place])   Colitis determined by colorectal biopsy  #1 bilateral pulmonary emboli/bilateral DVT Likely secondary to metastatic lung cancer. Patient with some clinical improvement. CT angiogram chest 09/02/2016 with acute bilateral PE with evidence of right heart strain consistent at least with submassive PE. Bilateral lower extremity Dopplers for DVT in the right common femoral vein, right profunda femoris vein, right femoral vein, right popliteal vein, right posterior tibial vein,right peroneal vein, right gastrocnemius vein, left  posterior tibial vein, left peroneal vein and left gastrocnemius vein. Patient currently on full dose Lovenox for pharmacy. Will likely need lifelong anticoagulation with Lovenox. 2-D echo with a EF of 55-60% with no wall motion abnormalities. Right ventricular systolic function was normal. Will need outpatient follow-up with her hematologist/oncologist.  #2 healthcare associated pneumonia Patient is currently afebrile. Some clinical improvement. WBCs normalized. Blood cultures with no growth to date. Sputum Gram stain and culture pending. IV antibiotics have been discontinued patient received a dose of oral Vantin and has completed a seven-day course of antibiotic treatment. No further antibiotics needed at this time.   #3 hypokalemia/hypophosphatemia/hypomagnesemia Likely secondary to GI losses. Replete. Keep magnesium greater than 2.  #4 ARF Likely secondary to ATN from IV contrast from CT angiogram 09/02/2016 and also prerenal azotemia due to volume depletion from GI losses from diarrhea. Creatinine currently at 0.56 from 0.83 from 1.14 from 1.46 from 1.82. Patient has been seen in consultation by nephrology. IV fluids has been decreased to 75 mL per hour. Improvement with GI output. Appreciate nephrology input and recommendations.  #5 metabolic acidosis Likely secondary to acute renal failure and diarrhea. Improving with improved renal function. Decrease rate of Bicarbonate drip. Appreciate renal input.   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 Patient has been off Nivolumab 3 months have a never took Chad as direct it. Patient with a decrease in amount of stool after being started on budesonide yesterday per GI. Questran was also added to bowel regimen. Patient with 200 mL over the past 24 hours stool. Rectal pouch fell out. Patient with  700 mL over the past 12 hours (10/4-5) from 1.5 L  (10/3-4).  C. difficile PCR negative. GI pathogen panel negative. CT abdomen and pelvis with grossly stable metastases involving the left iliac bone with associated pathologic fracture. Fluid-filled small and large bowel without significant distention no wall thickening. Mild sigmoid diverticulosis. New patchy airspace opacities of both lung bases. No other evidence of metastatic disease within the abdomen and pelvis. Patient is being followed by GI and patient has been started on budesonide as well as questran with improvement with diarrhea. Patient however states having multiple loose stools and does not want a rectal tube placed back. Patient on Lactinex, Lialda, and IV Decadron.  GI following and appreciate input and recommendations. IV fluids. Supportive care.  #7 hypocalcemia/vitamin D deficiency Likely secondary to vitamin D deficiency and xgeva. Ionized calcium was 5.2. Corrected calcium is 7.66 today. Magnesium level at 1.6 after repletion. Vitamin D 25-hydroxy levels at 10.9. Patient s/p  IV calcium gluconate  2 g due to worsening calcium and patient transient new onset A. Fib. Continue oral calcium with vitamin D. Keep magnesium greater than 2. Replete phosphorus. Vitamin D doses have been increased to 4000 units daily per oncology. Follow.  #8 transient new onset atrial fibrillation Likely secondary to electrolyte abnormalities of hypocalcemia, hypomagnesemia, hypophosphatemia and acute illness with bilateral PE, bilateral DVT, progressive diarrhea, acute renal failure. Patient converted back to normal sinus rhythm. Patient denies any chest pain or shortness of breath. TSH within normal limits at 2.016. 2-D echo with EF of 55-60%, no wall motion abnormalities, no valvular abnormalities 09/02/2016. Replete electrolytes. Already on anticoagulation secondary to problem #1. Follow.  #9 Barrett's esophagus with high-grade dysplasia Per GI. Patient will likely need to follow-up with [name].  #10  metastatic non-small cell lung cancer with bilateral lung nodules and large metastatic lesion of the left ilium Patient is to follow-up with oncology, Dr. Irene Limbo as outpatient. Oncology has been notified via Epic and ff.  #11 protein calorie malnutrition Nutritional supplementation.    DVT prophylaxis: Full dose Lovenox Code Status: Full Family Communication: Updated patient and son at bedside. Disposition Plan: When medically stable with resolution of diarrhea, resolution of acute renal failure and clinical improvement likely to skilled nursing facility with palliative care following.   Consultants:   Nephrology: Dr.Schertz 09/06/2016  Gastroenterology: Dr. Silverio Decamp 09/05/2016  Palliative care: Dr. Rowe Pavy 09/07/2016  Procedures:  CT angiogram chest 09/02/2016 CT abdomen and pelvis 09/04/2016 Chest x-ray 09/01/2016 2-D echo 09/02/2016 Lower extremity Dopplers 09/02/2016    Antimicrobials:   IV Azactam 09/01/2016>>> 09/03/2016  IV cefepime 09/03/2016>>>>>09/08/2016  IV vancomycin 09/01/2016>>>> 09/05/2016  Oral vantin 09/08/2016 x 1day   Subjective: Patient denies CP. No SOB. No emesis. No nausea. No abdominal pain. Patient states 3 loose stools overnight with some improvement with consistency. Patient asking when she can go home. Decreased stool output after being started on budesonide and questran per GI.   Objective: Vitals:   09/10/16 1432 09/10/16 1957 09/11/16 0500 09/11/16 0615  BP: (!) 124/52 118/67  132/62  Pulse: 76 71  75  Resp: '20 20  18  '$ Temp: 98.4 F (36.9 C) 98.3 F (36.8 C)  98.6 F (37 C)  TempSrc: Oral Oral  Oral  SpO2: 97% 99%  96%  Weight:   63 kg (138 lb 14.2 oz)   Height:        Intake/Output Summary (Last 24 hours) at 09/11/16 1051 Last data filed at 09/11/16 0344  Gross per 24 hour  Intake          1908.33 ml  Output             1000 ml  Net           908.33 ml   Filed Weights   09/09/16 1400 09/09/16 2153 09/11/16 0500  Weight:  66.1 kg (145 lb 11.2 oz) 63.3 kg (139 lb 8.8 oz) 63 kg (138 lb 14.2 oz)    Examination:  General exam: Weak.  Respiratory system: Clear to auscultation bilaterally. Respiratory effort normal. Cardiovascular system: RRR. No JVD, murmurs, rubs, gallops or clicks. No pedal edema. Gastrointestinal system: Abdomen is nondistended, soft and less tender to palpation. No organomegaly or masses felt. Normal bowel sounds heard. Central nervous system: Alert and oriented. No focal neurological deficits. Extremities: Symmetric 5 x 5 power. Skin: No rashes, lesions or ulcers Psychiatry: Judgement and insight appear normal. Mood & affect appropriate.     Data Reviewed: I have personally reviewed following labs and imaging studies  CBC:  Recent Labs Lab 09/08/16 0335 09/09/16 0400 09/10/16 0415  WBC 2.7* 1.4* 1.6*  NEUTROABS  --   --  0.7*  HGB 9.8* 9.6* 8.8*  HCT 28.9* 27.8* 26.0*  MCV 80.3 79.7 81.3  PLT 206 229 048   Basic Metabolic Panel:  Recent Labs Lab 09/07/16 0408 09/07/16 1130 09/08/16 0335 09/08/16 1030 09/09/16 0400 09/09/16 1430 09/10/16 0415 09/11/16 0500  NA 137  --  131*  --  133* 133* 136 137  K 3.4*  --  2.2*  --  2.9* 2.6* 3.0* 3.2*  CL 116*  --  103  --  101 101 105 104  CO2 14*  --  20*  --  20* '22 25 28  '$ GLUCOSE 72  --  70  --  97 104* 127* 112*  BUN 43*  --  34*  --  21* '19 14 8  '$ CREATININE 1.82*  --  1.46*  --  1.14* 1.01* 0.83 0.56  CALCIUM 5.3* 5.2* 4.7*  --  5.0* 4.9* 5.6* 5.9*  MG 1.9  --  1.5*  --  2.2  --  1.5* 1.6*  PHOS  --  1.7*  --  1.3* 3.0  --  1.3* 1.4*   GFR: Estimated Creatinine Clearance: 59.5 mL/min (by C-G formula based on SCr of 0.56 mg/dL). Liver Function Tests:  Recent Labs Lab 09/05/16 0435 09/08/16 0335 09/10/16 0415  AST 67* 51* 36  ALT 77* 42 31  ALKPHOS 64 79 84  BILITOT 0.5 1.0 0.7  PROT 5.8* 4.3* 4.8*  ALBUMIN 2.1* 1.6* 1.8*    Recent Labs Lab 09/05/16 0435  LIPASE 22   No results for input(s): AMMONIA  in the last 168 hours. Coagulation Profile: No results for input(s): INR, PROTIME in the last 168 hours. Cardiac Enzymes: No results for input(s): CKTOTAL, CKMB, CKMBINDEX, TROPONINI in the last 168 hours. BNP (last 3 results) No results for input(s): PROBNP in the last 8760 hours. HbA1C: No results for input(s): HGBA1C in the last 72 hours. CBG: No results for input(s): GLUCAP in the last 168 hours. Lipid Profile: No results for input(s): CHOL, HDL, LDLCALC, TRIG, CHOLHDL, LDLDIRECT in the last 72 hours. Thyroid Function Tests: No results for input(s): TSH, T4TOTAL, FREET4, T3FREE, THYROIDAB in the last 72 hours. Anemia Panel: No results for input(s): VITAMINB12, FOLATE, FERRITIN, TIBC, IRON, RETICCTPCT in the last 72 hours. Sepsis Labs: No results for input(s): PROCALCITON, LATICACIDVEN in the last 168 hours.  Recent  Results (from the past 240 hour(s))  C difficile quick scan w PCR reflex     Status: None   Collection Time: 09/02/16  7:15 PM  Result Value Ref Range Status   C Diff antigen NEGATIVE NEGATIVE Final   C Diff toxin NEGATIVE NEGATIVE Final   C Diff interpretation No C. difficile detected.  Final  Culture, blood (Routine X 2) w Reflex to ID Panel     Status: None   Collection Time: 09/03/16  6:05 PM  Result Value Ref Range Status   Specimen Description BLOOD RIGHT ARM  Final   Special Requests BOTTLES DRAWN AEROBIC AND ANAEROBIC 5CC  Final   Culture   Final    NO GROWTH 5 DAYS Performed at [facility]    Report Status 09/09/2016 FINAL  Final  Culture, blood (Routine X 2) w Reflex to ID Panel     Status: None   Collection Time: 09/03/16  6:11 PM  Result Value Ref Range Status   Specimen Description BLOOD RIGHT ARM  Final   Special Requests BOTTLES DRAWN AEROBIC ONLY 5CC  Final   Culture   Final    NO GROWTH 5 DAYS Performed at [facility]    Report Status 09/09/2016 FINAL  Final  Gastrointestinal Panel by PCR , Stool     Status: None    Collection Time: 09/06/16 10:44 AM  Result Value Ref Range Status   Campylobacter species NOT DETECTED NOT DETECTED Final   Plesimonas shigelloides NOT DETECTED NOT DETECTED Final   Salmonella species NOT DETECTED NOT DETECTED Final   Yersinia enterocolitica NOT DETECTED NOT DETECTED Final   Vibrio species NOT DETECTED NOT DETECTED Final   Vibrio cholerae NOT DETECTED NOT DETECTED Final   Enteroaggregative E coli (EAEC) NOT DETECTED NOT DETECTED Final   Enteropathogenic E coli (EPEC) NOT DETECTED NOT DETECTED Final   Enterotoxigenic E coli (ETEC) NOT DETECTED NOT DETECTED Final   Shiga like toxin producing E coli (STEC) NOT DETECTED NOT DETECTED Final   Shigella/Enteroinvasive E coli (EIEC) NOT DETECTED NOT DETECTED Final   Cryptosporidium NOT DETECTED NOT DETECTED Final   Cyclospora cayetanensis NOT DETECTED NOT DETECTED Final   Entamoeba histolytica NOT DETECTED NOT DETECTED Final   Giardia lamblia NOT DETECTED NOT DETECTED Final   Adenovirus F40/41 NOT DETECTED NOT DETECTED Final   Astrovirus NOT DETECTED NOT DETECTED Final   Norovirus GI/GII NOT DETECTED NOT DETECTED Final   Rotavirus A NOT DETECTED NOT DETECTED Final   Sapovirus (I, II, IV, and V) NOT DETECTED NOT DETECTED Final         Radiology Studies: No results found.      Scheduled Meds: . amLODipine  5 mg Oral Daily  . budesonide  9 mg Oral Daily  . calcium-vitamin D  2 tablet Oral TID  . chlorhexidine  15 mL Mouth Rinse BID  . cholestyramine  4 g Oral 2 times per day  . citalopram  20 mg Oral Daily  . dexamethasone  4 mg Intravenous Q12H  . enoxaparin (LOVENOX) injection  60 mg Subcutaneous Q12H  . ergocalciferol  4,000 Units Oral Daily  . famotidine  20 mg Oral Daily  . feeding supplement (PEDIASURE PEPTIDE 1.0 CAL)  237 mL Oral TID BM  . lactobacillus acidophilus & bulgar  1 tablet Oral TID WC  . liver oil-zinc oxide   Topical Daily  . magic mouthwash  5 mL Oral TID  . magnesium oxide  400 mg Oral BID    .  magnesium sulfate 1 - 4 g bolus IVPB  4 g Intravenous Once  . mouth rinse  15 mL Mouth Rinse q12n4p  . mesalamine  4.8 g Oral Q breakfast  . potassium chloride  40 mEq Oral Q4H  . potassium phosphate IVPB (mmol)  20 mmol Intravenous Once  . sodium chloride flush  10-40 mL Intracatheter Q12H   Continuous Infusions: .  sodium bicarbonate infusion 1/4 NS 1000 mL 100 mL/hr at 09/11/16 1026     LOS: 9 days    Time spent: 54 mins    THOMPSON,DANIEL, MD [facility] Pager [phone]  If 7PM-7AM, please contact night-coverage [email or web address] Password [email or web address] 09/11/2016, 10:51 AM

## 2016-09-11 NOTE — Clinical Social Work Placement (Signed)
   CLINICAL SOCIAL WORK PLACEMENT  NOTE  Date:  09/11/2016  Patient Details  Name: Cindy Byrd MRN: [id number] Date of Birth: [birth year]  Clinical Social Work is seeking post-discharge placement for this patient at the [place] level of care (*CSW will initial, date and re-position this form in  chart as items are completed):  Yes   Patient/family provided with [facility] Work Department's list of facilities offering this level of care within the geographic area requested by the patient (or if unable, by the patient's family).  Yes   Patient/family informed of their freedom to choose among providers that offer the needed level of care, that participate in Medicare, Medicaid or managed care program needed by the patient, have an available bed and are willing to accept the patient.  Yes   Patient/family informed of [facility]'s ownership interest in [facility] and [facility], as well as of the fact that they are under no obligation to receive care at these facilities.  PASRR submitted to EDS on 09/09/16     PASRR number received on 09/09/16     Existing PASRR number confirmed on       FL2 transmitted to all facilities in geographic area requested by pt/family on       FL2 transmitted to all facilities within larger geographic area on 09/09/16     Patient informed that his/her managed care company has contracts with or will negotiate with certain facilities, including the following:        Yes   Patient/family informed of bed offers received.  Patient chooses bed at       Physician recommends and patient chooses bed at      Patient to be transferred to   on  .  Patient to be transferred to facility by       Patient family notified on   of transfer.  Name of family member notified:        PHYSICIAN       Additional Comment:    _______________________________________________ Ladell Pier, LCSW 09/11/2016, 2:09 PM

## 2016-09-12 LAB — PHOSPHORUS: PHOSPHORUS: 1.8 mg/dL — AB (ref 2.5–4.6)

## 2016-09-12 LAB — BASIC METABOLIC PANEL
ANION GAP: 5 (ref 5–15)
BUN: 6 mg/dL (ref 6–20)
CO2: 30 mmol/L (ref 22–32)
Calcium: 5.9 mg/dL — CL (ref 8.9–10.3)
Chloride: 100 mmol/L — ABNORMAL LOW (ref 101–111)
Creatinine, Ser: 0.49 mg/dL (ref 0.44–1.00)
GFR calc Af Amer: >60 mL/min (ref >60)
Glucose, Bld: 111 mg/dL — ABNORMAL HIGH (ref 65–99)
POTASSIUM: 3.4 mmol/L — AB (ref 3.5–5.1)
SODIUM: 135 mmol/L (ref 135–145)

## 2016-09-12 LAB — CBC
HCT: 24.8 % — ABNORMAL LOW (ref 36.0–46.0)
HEMOGLOBIN: 8.2 g/dL — AB (ref 12.0–15.0)
MCH: 27 pg (ref 26.0–34.0)
MCHC: 33.1 g/dL (ref 30.0–36.0)
MCV: 81.6 fL (ref 78.0–100.0)
Platelets: 245 10*3/uL (ref 150–400)
RBC: 3.04 MIL/uL — AB (ref 3.87–5.11)
RDW: 15.6 % — ABNORMAL HIGH (ref 11.5–15.5)
WBC: 2.1 10*3/uL — ABNORMAL LOW (ref 4.0–10.5)

## 2016-09-12 LAB — CBC AND DIFFERENTIAL: WBC: 2.1 10^3/mL

## 2016-09-12 LAB — MAGNESIUM: MAGNESIUM: 1.6 mg/dL — AB (ref 1.7–2.4)

## 2016-09-12 MED ORDER — POTASSIUM CHLORIDE CRYS ER 20 MEQ PO TBCR
40.0000 meq | EXTENDED_RELEASE_TABLET | Freq: Once | ORAL | Status: AC
  Administered 2016-09-12: 40 meq via ORAL
  Filled 2016-09-12: qty 2

## 2016-09-12 MED ORDER — POTASSIUM PHOSPHATES 15 MMOLE/5ML IV SOLN
20.0000 mmol | Freq: Once | INTRAVENOUS | Status: AC
  Administered 2016-09-12: 20 mmol via INTRAVENOUS
  Filled 2016-09-12: qty 6.67

## 2016-09-12 MED ORDER — MAGNESIUM SULFATE 4 GM/100ML IV SOLN
4.0000 g | Freq: Once | INTRAVENOUS | Status: AC
  Administered 2016-09-12: 4 g via INTRAVENOUS
  Filled 2016-09-12: qty 100

## 2016-09-12 NOTE — Progress Notes (Signed)
Cindy Byrd for Phosphate Replacement Indication: Hypophosphatemia  Allergies  Allergen Reactions  . Penicillins Other (See Comments)    Unknown; childhood allergy  . Remeron [Mirtazapine] Other (See Comments)    nightmares  . Latex Rash   Patient Measurements: Height: '5\' 8"'$  (172.7 cm) Weight: 138 lb 3.7 oz (62.7 kg) IBW/kg (Calculated) : 63.9  Vital Signs: Temp: 98.2 F (36.8 C) (10/09 0408) Temp Source: Oral (10/09 0408) BP: 142/70 (10/09 0408) Pulse Rate: 67 (10/09 0408) Intake/Output from previous day: 10/08 0701 - 10/09 0700 In: 3367.5 [P.O.:960; I.V.:1900.8; IV Piggyback:506.7] Out: 1475 [Urine:1475] Intake/Output from this shift: No intake/output data recorded.  Labs:  Recent Labs  09/10/16 0415 09/11/16 0500 09/12/16 0730  WBC 1.6*  --  2.1*  HGB 8.8*  --  8.2*  HCT 26.0*  --  24.8*  PLT 240  --  245  CREATININE 0.83 0.56 0.49  MG 1.5* 1.6* 1.6*  PHOS 1.3* 1.4* 1.8*  ALBUMIN 1.8*  --   --   PROT 4.8*  --   --   AST 36  --   --   ALT 31  --   --   ALKPHOS 84  --   --   BILITOT 0.7  --   --    Estimated Creatinine Clearance: 59.2 mL/min (by C-G formula based on SCr of 0.49 mg/dL).  Assessment: 58 y/oF presented to ED on 09/01/16 with chest pain. 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 Patient currently not receiving any chemotherapy but is receiving Xgeva for bone mets. Patient known to pharmacy for antibiotic dosing for HCAP and lovenox dosing for PE. Pharmacy now consulted to assist with phosphate replacement. Of note, patient now with AKI likely secondary to ATN from IV contrast and prerenal azotemia due to volume depletion secondary to high output diarrhea.   Today, 09/12/16: - SCr: 0.49  - K+: 3.4 - TRH  PO replacement - Mag 1.6 - TRH PO  replacement - Corr Ca = 7.66 -  TRH IV and PO  replacement - Na = 135  - Phos: incr to 1.8 mg/dL   Plan:   Due to improved Na and continued low potassium give potassium  phosphate 20 mmol IV x 1   Repeat Phos in am  Minda Ditto PharmD Pager [phone] 09/12/2016, 11:23 AM

## 2016-09-12 NOTE — Progress Notes (Signed)
ANTICOAGULATION CONSULT NOTE - Follow up [facility] for Lovenox Indication: pulmonary embolus  Allergies  Allergen Reactions  . Penicillins Other (See Comments)    Unknown; childhood allergy  . Remeron [Mirtazapine] Other (See Comments)    nightmares  . Latex Rash   Patient Measurements: Height: '5\' 8"'$  (172.7 cm) Weight: 138 lb 3.7 oz (62.7 kg) IBW/kg (Calculated) : 63.9  Vital Signs: Temp: 98.2 F (36.8 C) (10/09 0408) Temp Source: Oral (10/09 0408) BP: 142/70 (10/09 0408) Pulse Rate: 67 (10/09 0408)  Labs:  Recent Labs  09/10/16 0415 09/11/16 0500 09/12/16 0730  HGB 8.8*  --  8.2*  HCT 26.0*  --  24.8*  PLT 240  --  245  CREATININE 0.83 0.56 0.49   Estimated Creatinine Clearance: 59.2 mL/min (by C-G formula based on SCr of 0.49 mg/dL).    Assessment: 75 yo female presented to ER with LUQ pain and diarrhea. 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 Patient currently not receiving any chemotherapy but is receiving Xgeva for bone mets.  She is known to pharmacy for antibiotic consults for possible PNA. CT now shows acute bilateral PE.  Pharmacy is consulted to dose Lovenox.  Prophylactic Lovenox '40mg'$  started on admission; last given SQ on 9/28 at ~2300.  Today, 09/12/2016:  CBC:  Hgb low/stable, Plt remain WNL (10/9)  SCr wnl  No bleeding or complications reported.  Goal of Therapy:  Anti-Xa level 0.6-1 units/ml 4hrs after LMWH dose given Monitor platelets by anticoagulation protocol: Yes   Plan:   Continue Lovenox 60 mg SQ q12h for acute PE  Monitor CBC at least q72h while in hospital , renal function  Minda Ditto PharmD Pager [phone] 09/12/2016, 11:26 AM

## 2016-09-12 NOTE — Care Management Note (Signed)
Case Management Note  Patient Details  Name: Cindy Byrd MRN: [id number] Date of Birth: [birth year]  Subjective/Objective:  Noted diarrhea improved.k-3.4,mg-1.6-kruns,mg run. IV decadron iv q 12.,questran. D/c plan SNF-CSW following.                  Action/Plan:d/c SNF.   Expected Discharge Date:   (unknown)               Expected Discharge Plan:  [place]  In-House Referral:     Discharge planning Services  CM Consult  Post Acute Care Choice:  Durable Medical Equipment (cane, rw,3n1) Choice offered to:     DME Arranged:    DME Agency:     HH Arranged:    HH Agency:     Status of Service:  In process, will continue to follow  If discussed at Long Length of Stay Meetings, dates discussed:    Additional Comments:  Dessa Phi, RN 09/12/2016, 12:34 PM

## 2016-09-12 NOTE — Progress Notes (Signed)
Nutrition Follow-up  DOCUMENTATION CODES:   Severe malnutrition in context of acute illness/injury  INTERVENTION:  - Continue Pediasure TID. - Continue to encourage PO intakes of meals and supplements. - RD will continue to monitor for nutrition-related needs.  NUTRITION DIAGNOSIS:   Malnutrition (Severe) related to acute illness as evidenced by energy intake < or equal to 50% for > or equal to 5 days, percent weight loss. -ongoing, improving slightly with improving POs.  GOAL:   Patient will meet greater than or equal to 90% of their needs -unmet at this time.  MONITOR:   PO intake, Supplement acceptance, Weight trends, Labs, Skin, I & O's  ASSESSMENT:   75 y.o. 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   10/9 Per chart review, pt ate 5% of dinner 10/5 and no other documentation since that time. Pt reports that for breakfast this AM she had an egg and cheese biscuit and ate this without issue. She denies having any experiences of nausea or abdominal discomfort with PO intakes since RD visit on 10/5. Her children have been bringing in food items that she likes and she feels that she has been eating better because of this; pt denies offer to assist in finding items she may like from hospital menu. She was drinking Pediasure PTA and it is currently ordered TID. She drank ~25% of one bottle prior to RD visit this AM.   RN at bedside states that she encouraged pt to order lunch but pt was not yet hungry. Pt has several snack items, including a box of crackers, at bedside and she states she has "a method to taking my medications." Continue to encourage PO intakes. GI signed off 10/8.  Medications reviewed; 2 tablets Oscal with D TID, 4000 units  oral Drisdol/day, 20 mg oral Pepcid/day, 400 mg Mag-Ox BID, 4 g IV Mg sulfate x1 dose yesterday and x1 dose today, PRN IV Zofran, 40 mEq oral KCl x2 doses yesterday and x1 dose today, 20 mmol IV KPhos x1 dose today.   Labs reviewed; K: 3.4 mmol/L, Cl: 100 mmol/L, Ca: 5.9 mg/dL, Phos: 1.8 mg/dL, Mg: 1.6 mg/dL (goal of >/= 2 mg/dL per MD note).     10/5 - Reviewed Palliative Care note from meeting with family yesterday afternoon; no nutrition support desired.  - Pt reports abdominal discomfort and nausea persists this AM and that d/t this she has no appetite/desire for PO.  - RN in the room states that pt has been taking oral medications without issue.  - Pt reports taking pills with water and 1 cracker so far.  - RN reports daughter stated she was going to bring in a hard boiled egg and drink for pt; encouraged pt to have family bring items she may like.  - Will trial Prostat once/day mixed in pt's beverage of choice.  IVF: 50 mEq sodium bicarb in 1/4 NS @ 125 mL/hr.    10/4 - No meal completion documented since previous assessment. -  Pt reports ongoing, daily nausea and abdominal pain.  - She denies having anything to eat yesterday or this AM and states that she has only been drinking water.  - Palliative Care following pt and plan for meeting scheduled at 1300 today.    Diet Order:  DIET SOFT Room service appropriate? Yes;  Fluid consistency: Thin  Skin:  Wound (see comment) (Stage 2 R buttocks and Stage 1 bilateral buttocks pressure injuries)  Last BM:  10/9  Height:   Ht Readings from Last 1 Encounters:  09/09/16 '5\' 8"'$  (1.727 m)    Weight:   Wt Readings from Last 1 Encounters:  09/12/16 138 lb 3.7 oz (62.7 kg)    Ideal Body Weight:  63.64 kg  BMI:  Body mass index is 21.02 kg/m.  Estimated Nutritional Needs:   Kcal:  1700-2000  Protein:  70-85 grams  Fluid:  1.7-2 L/day  EDUCATION NEEDS:   No education needs identified at this time    Jarome Matin,  MS, RD, LDN Inpatient Clinical Dietitian Pager # [phone] After hours/weekend pager # [phone]

## 2016-09-12 NOTE — Progress Notes (Signed)
PROGRESS NOTE    Cindy Byrd  ONG:[id number] DOB: 1941/04/16 DOA: 09/01/2016 PCP: Tamsen Roers, MD    Brief Narrative:   Cindy Moxon Toweryis a 75 y.o.femalewith metastatic non-small cell lung cancer under observation, hypertension, Barrett's esophagus, anemia and colitis secondary to Nivolumabwith persistent diarrhea presents to the ER because of chest pain.  She was found to have bilateral PE and a DVT. Patient also noted to have a pneumonia. She was started on lovenox. Meanwhile her creatinine started worsening , CT ruled out obstructive causes. Renal consulted. GI following.    Assessment & Plan:   Principal Problem:   Bilateral pulmonary embolism (HCC) Active Problems:   HCAP (healthcare-associated pneumonia)   ARF (acute renal failure) (HCC)   Diarrhea   DVT of lower extremity, bilateral (HCC)   Protein calorie malnutrition (HCC)   HTN (hypertension)   Primary malignant neoplasm of lung metastatic to other site ([place])   Barrett's esophagus determined by biopsy   Nausea with vomiting   Hypokalemia   Pressure injury of skin   Cancer ([place])   Ulcerative pancolitis without complication ([place])   Palliative care by specialist   Goals of care, counseling/discussion   Advance care planning   Hypocalcemia   Vitamin D deficiency   Chest pain   New onset a-fib ([place])   Thrush of mouth and esophagus ([place])   Protein-calorie malnutrition, severe ([place])   Colitis determined by colorectal biopsy  #1 bilateral pulmonary emboli/bilateral DVT Likely secondary to metastatic lung cancer. Patient with some clinical improvement. CT angiogram chest 09/02/2016 with acute bilateral PE with evidence of right heart strain consistent at least with submassive PE. Bilateral lower extremity Dopplers for DVT in the right common femoral vein, right profunda femoris vein, right femoral vein, right popliteal vein, right posterior tibial vein,right peroneal vein, right gastrocnemius vein, left  posterior tibial vein, left peroneal vein and left gastrocnemius vein. Patient currently on full dose Lovenox for pharmacy. Will likely need lifelong anticoagulation with Lovenox. 2-D echo with a EF of 55-60% with no wall motion abnormalities. Right ventricular systolic function was normal. Will need outpatient follow-up with her hematologist/oncologist.  #2 healthcare associated pneumonia Patient is currently afebrile. Some clinical improvement. WBCs normalized. Blood cultures with no growth to date. Sputum Gram stain and culture pending. IV antibiotics have been discontinued patient received a dose of oral Vantin and has completed a seven-day course of antibiotic treatment. No further antibiotics needed at this time.   #3 hypokalemia/hypophosphatemia/hypomagnesemia Likely secondary to GI losses. Replete. Keep magnesium greater than 2.  #4 ARF Likely secondary to ATN from IV contrast from CT angiogram 09/02/2016 and also prerenal azotemia due to volume depletion from GI losses from diarrhea. Creatinine currently at 0.56 from 0.83 from 1.14 from 1.46 from 1.82. Patient has been seen in consultation by nephrology.NSL IVF. Improvement with GI output. Appreciate nephrology input and recommendations.  #5 metabolic acidosis Likely secondary to acute renal failure and diarrhea. Improving with improved renal function. D/C  Bicarbonate drip. Appreciate renal input.   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 Patient has been off Nivolumab 3 months have a never took Chad as direct it. Patient with a decrease in amount of stool after being started on budesonide yesterday per GI. Questran was also added to bowel regimen. Patient with 200 mL over the past 24 hours stool. Rectal pouch fell out. Patient with 700 mL over the past 12 hours (10/4-5) from 1.5  L (10/3-4).  C. difficile PCR negative. GI pathogen panel  negative. CT abdomen and pelvis with grossly stable metastases involving the left iliac bone with associated pathologic fracture. Fluid-filled small and large bowel without significant distention no wall thickening. Mild sigmoid diverticulosis. New patchy airspace opacities of both lung bases. No other evidence of metastatic disease within the abdomen and pelvis. Patient is being followed by GI and patient has been started on budesonide as well as questran with improvement with diarrhea. Patient states improvement with multiple loose stools and does not want a rectal tube placed back. Patient on Lactinex, Lialda, and IV Decadron.  GI following and appreciate input and recommendations. IV fluids. Supportive care.  #7 hypocalcemia/vitamin D deficiency Likely secondary to vitamin D deficiency and xgeva. Ionized calcium was 5.2. Corrected calcium is 7.66 today. Magnesium level at 1.6 after repletion. Vitamin D 25-hydroxy levels at 10.9. Patient s/p  IV calcium gluconate  2 g due to worsening calcium and patient transient new onset A. Fib. Continue oral calcium with vitamin D. Keep magnesium greater than 2. Replete phosphorus. Vitamin D doses have been increased to 4000 units daily per oncology. Follow.  #8 transient new onset atrial fibrillation Likely secondary to electrolyte abnormalities of hypocalcemia, hypomagnesemia, hypophosphatemia and acute illness with bilateral PE, bilateral DVT, progressive diarrhea, acute renal failure. Patient converted back to normal sinus rhythm. Patient denies any chest pain or shortness of breath. TSH within normal limits at 2.016. 2-D echo with EF of 55-60%, no wall motion abnormalities, no valvular abnormalities 09/02/2016. Replete electrolytes. Already on anticoagulation secondary to problem #1. Follow.  #9 Barrett's esophagus with high-grade dysplasia Per GI. Patient will likely need to follow-up with [facility] in [place].  #10 metastatic non-small cell lung cancer  with bilateral lung nodules and large metastatic lesion of the left ilium Patient is to follow-up with oncology, Dr. Irene Limbo as outpatient. Oncology has been notified via Epic and ff.  #11 protein calorie malnutrition Nutritional supplementation.    DVT prophylaxis: Full dose Lovenox Code Status: Full Family Communication: Updated patient and son at bedside. Disposition Plan: When medically stable with resolution of diarrhea, resolution of acute renal failure and clinical improvement likely to skilled nursing facility with palliative care following hopefully in 24-48  Hours..   Consultants:   Nephrology: Dr.Schertz 09/06/2016  Gastroenterology: Dr. Silverio Decamp 09/05/2016  Palliative care: Dr. Rowe Pavy 09/07/2016  Procedures:  CT angiogram chest 09/02/2016 CT abdomen and pelvis 09/04/2016 Chest x-ray 09/01/2016 2-D echo 09/02/2016 Lower extremity Dopplers 09/02/2016    Antimicrobials:   IV Azactam 09/01/2016>>> 09/03/2016  IV cefepime 09/03/2016>>>>>09/08/2016  IV vancomycin 09/01/2016>>>> 09/05/2016  Oral vantin 09/08/2016 x 1day   Subjective: Patient denies CP. No SOB. No emesis. No nausea. No abdominal pain. Patient number of stools decreased and improvement with diarrhea. Decreased stool output after being started on budesonide and questran per GI.   Objective: Vitals:   09/11/16 1300 09/11/16 2100 09/12/16 0408 09/12/16 0502  BP: 129/70 125/68 (!) 142/70   Pulse: 77 79 67   Resp: '18 18 16   '$ Temp: 98.8 F (37.1 C) 98.9 F (37.2 C) 98.2 F (36.8 C)   TempSrc: Oral Oral Oral   SpO2: 98% 99% 97%   Weight:    62.7 kg (138 lb 3.7 oz)  Height:        Intake/Output Summary (Last 24 hours) at 09/12/16 1054 Last data filed at 09/12/16 1000  Gross per 24 hour  Intake  2827.5 ml  Output             1477 ml  Net           1350.5 ml   Filed Weights   09/09/16 2153 09/11/16 0500 09/12/16 0502  Weight: 63.3 kg (139 lb 8.8 oz) 63 kg (138 lb 14.2 oz) 62.7 kg (138  lb 3.7 oz)    Examination:  General exam: Weak.  Respiratory system: Clear to auscultation bilaterally. Respiratory effort normal. Cardiovascular system: RRR. No JVD, murmurs, rubs, gallops or clicks. No pedal edema. Gastrointestinal system: Abdomen is nondistended, soft and less tender to palpation. No organomegaly or masses felt. Normal bowel sounds heard. Central nervous system: Alert and oriented. No focal neurological deficits. Extremities: Symmetric 5 x 5 power. Skin: No rashes, lesions or ulcers Psychiatry: Judgement and insight appear normal. Mood & affect appropriate.     Data Reviewed: I have personally reviewed following labs and imaging studies  CBC:  Recent Labs Lab 09/08/16 0335 09/09/16 0400 09/10/16 0415 09/12/16 0730  WBC 2.7* 1.4* 1.6* 2.1*  NEUTROABS  --   --  0.7*  --   HGB 9.8* 9.6* 8.8* 8.2*  HCT 28.9* 27.8* 26.0* 24.8*  MCV 80.3 79.7 81.3 81.6  PLT 206 229 240 683   Basic Metabolic Panel:  Recent Labs Lab 09/08/16 0335 09/08/16 1030 09/09/16 0400 09/09/16 1430 09/10/16 0415 09/11/16 0500 09/12/16 0730  NA 131*  --  133* 133* 136 137 135  K 2.2*  --  2.9* 2.6* 3.0* 3.2* 3.4*  CL 103  --  101 101 105 104 100*  CO2 20*  --  20* '22 25 28 30  '$ GLUCOSE 70  --  97 104* 127* 112* 111*  BUN 34*  --  21* '19 14 8 6  '$ CREATININE 1.46*  --  1.14* 1.01* 0.83 0.56 0.49  CALCIUM 4.7*  --  5.0* 4.9* 5.6* 5.9* 5.9*  MG 1.5*  --  2.2  --  1.5* 1.6* 1.6*  PHOS  --  1.3* 3.0  --  1.3* 1.4* 1.8*   GFR: Estimated Creatinine Clearance: 59.2 mL/min (by C-G formula based on SCr of 0.49 mg/dL). Liver Function Tests:  Recent Labs Lab 09/08/16 0335 09/10/16 0415  AST 51* 36  ALT 42 31  ALKPHOS 79 84  BILITOT 1.0 0.7  PROT 4.3* 4.8*  ALBUMIN 1.6* 1.8*   No results for input(s): LIPASE, AMYLASE in the last 168 hours. No results for input(s): AMMONIA in the last 168 hours. Coagulation Profile: No results for input(s): INR, PROTIME in the last 168  hours. Cardiac Enzymes: No results for input(s): CKTOTAL, CKMB, CKMBINDEX, TROPONINI in the last 168 hours. BNP (last 3 results) No results for input(s): PROBNP in the last 8760 hours. HbA1C: No results for input(s): HGBA1C in the last 72 hours. CBG: No results for input(s): GLUCAP in the last 168 hours. Lipid Profile: No results for input(s): CHOL, HDL, LDLCALC, TRIG, CHOLHDL, LDLDIRECT in the last 72 hours. Thyroid Function Tests: No results for input(s): TSH, T4TOTAL, FREET4, T3FREE, THYROIDAB in the last 72 hours. Anemia Panel: No results for input(s): VITAMINB12, FOLATE, FERRITIN, TIBC, IRON, RETICCTPCT in the last 72 hours. Sepsis Labs: No results for input(s): PROCALCITON, LATICACIDVEN in the last 168 hours.  Recent Results (from the past 240 hour(s))  C difficile quick scan w PCR reflex     Status: None   Collection Time: 09/02/16  7:15 PM  Result Value Ref Range Status   C Diff  antigen NEGATIVE NEGATIVE Final   C Diff toxin NEGATIVE NEGATIVE Final   C Diff interpretation No C. difficile detected.  Final  Culture, blood (Routine X 2) w Reflex to ID Panel     Status: None   Collection Time: 09/03/16  6:05 PM  Result Value Ref Range Status   Specimen Description BLOOD RIGHT ARM  Final   Special Requests BOTTLES DRAWN AEROBIC AND ANAEROBIC 5CC  Final   Culture   Final    NO GROWTH 5 DAYS Performed at [facility]    Report Status 09/09/2016 FINAL  Final  Culture, blood (Routine X 2) w Reflex to ID Panel     Status: None   Collection Time: 09/03/16  6:11 PM  Result Value Ref Range Status   Specimen Description BLOOD RIGHT ARM  Final   Special Requests BOTTLES DRAWN AEROBIC ONLY 5CC  Final   Culture   Final    NO GROWTH 5 DAYS Performed at [facility]    Report Status 09/09/2016 FINAL  Final  Gastrointestinal Panel by PCR , Stool     Status: None   Collection Time: 09/06/16 10:44 AM  Result Value Ref Range Status   Campylobacter species NOT DETECTED  NOT DETECTED Final   Plesimonas shigelloides NOT DETECTED NOT DETECTED Final   Salmonella species NOT DETECTED NOT DETECTED Final   Yersinia enterocolitica NOT DETECTED NOT DETECTED Final   Vibrio species NOT DETECTED NOT DETECTED Final   Vibrio cholerae NOT DETECTED NOT DETECTED Final   Enteroaggregative E coli (EAEC) NOT DETECTED NOT DETECTED Final   Enteropathogenic E coli (EPEC) NOT DETECTED NOT DETECTED Final   Enterotoxigenic E coli (ETEC) NOT DETECTED NOT DETECTED Final   Shiga like toxin producing E coli (STEC) NOT DETECTED NOT DETECTED Final   Shigella/Enteroinvasive E coli (EIEC) NOT DETECTED NOT DETECTED Final   Cryptosporidium NOT DETECTED NOT DETECTED Final   Cyclospora cayetanensis NOT DETECTED NOT DETECTED Final   Entamoeba histolytica NOT DETECTED NOT DETECTED Final   Giardia lamblia NOT DETECTED NOT DETECTED Final   Adenovirus F40/41 NOT DETECTED NOT DETECTED Final   Astrovirus NOT DETECTED NOT DETECTED Final   Norovirus GI/GII NOT DETECTED NOT DETECTED Final   Rotavirus A NOT DETECTED NOT DETECTED Final   Sapovirus (I, II, IV, and V) NOT DETECTED NOT DETECTED Final         Radiology Studies: No results found.      Scheduled Meds: . amLODipine  5 mg Oral Daily  . budesonide  9 mg Oral Daily  . calcium-vitamin D  2 tablet Oral TID  . chlorhexidine  15 mL Mouth Rinse BID  . cholestyramine  4 g Oral 2 times per day  . citalopram  20 mg Oral Daily  . dexamethasone  4 mg Intravenous Q12H  . enoxaparin (LOVENOX) injection  60 mg Subcutaneous Q12H  . ergocalciferol  4,000 Units Oral Daily  . famotidine  20 mg Oral Daily  . feeding supplement (PEDIASURE PEPTIDE 1.0 CAL)  237 mL Oral TID BM  . lactobacillus acidophilus & bulgar  1 tablet Oral TID WC  . liver oil-zinc oxide   Topical Daily  . magic mouthwash  5 mL Oral TID  . magnesium oxide  400 mg Oral BID  . magnesium sulfate 1 - 4 g bolus IVPB  4 g Intravenous Once  . mouth rinse  15 mL Mouth Rinse q12n4p   . mesalamine  4.8 g Oral Q breakfast  . potassium phosphate  IVPB (mmol)  20 mmol Intravenous Once  . sodium chloride flush  10-40 mL Intracatheter Q12H   Continuous Infusions:     LOS: 10 days    Time spent: 64 mins    Akshith Moncus, MD [facility] Pager [phone]  If 7PM-7AM, please contact night-coverage [email or web address] Password [email or web address] 09/12/2016, 10:54 AM

## 2016-09-13 DIAGNOSIS — C3490 Malignant neoplasm of unspecified part of unspecified bronchus or lung: Secondary | ICD-10-CM

## 2016-09-13 LAB — FECAL LACTOFERRIN, QUANT: Lactoferrin, Fecal, Quant.: 21.6 ug/mL(g) — ABNORMAL HIGH (ref 0.00–7.24)

## 2016-09-13 LAB — MAGNESIUM: Magnesium: 1.6 mg/dL — ABNORMAL LOW (ref 1.7–2.4)

## 2016-09-13 LAB — BASIC METABOLIC PANEL
ANION GAP: 7 (ref 5–15)
BUN: 7 mg/dL (ref 4–21)
BUN: 7 mg/dL (ref 6–20)
CO2: 30 mmol/L (ref 22–32)
Calcium: 6.2 mg/dL — CL (ref 8.9–10.3)
Chloride: 99 mmol/L — ABNORMAL LOW (ref 101–111)
Creatinine, Ser: 0.55 mg/dL (ref 0.44–1.00)
Creatinine: 0.6 mg/dL (ref <=1.1)
GFR calc Af Amer: >60 mL/min (ref >60)
GLUCOSE: 107 mg/dL
GLUCOSE: 107 mg/dL — AB (ref 65–99)
POTASSIUM: 3.6 mmol/L (ref 3.5–5.1)
Sodium: 136 mmol/L (ref 135–145)
Sodium: 136 mmol/L — AB (ref 137–147)

## 2016-09-13 LAB — PHOSPHORUS: Phosphorus: 2.7 mg/dL (ref 2.5–4.6)

## 2016-09-13 MED ORDER — CHOLESTYRAMINE 4 G PO PACK: 4.0000 g | PACK | Freq: Two times a day (BID) | ORAL | 0 refills | Status: DC

## 2016-09-13 MED ORDER — LORAZEPAM 1 MG PO TABS: 1.0000 mg | ORAL_TABLET | Freq: Three times a day (TID) | ORAL | 0 refills | Status: DC | PRN

## 2016-09-13 MED ORDER — ENOXAPARIN SODIUM 60 MG/0.6ML ~~LOC~~ SOLN: 60.0000 mg | Freq: Two times a day (BID) | SUBCUTANEOUS | 0 refills | Status: DC

## 2016-09-13 MED ORDER — OXYCODONE-ACETAMINOPHEN 5-325 MG PO TABS
1.0000 | ORAL_TABLET | ORAL | 0 refills | Status: DC | PRN
Start: 2016-09-13 — End: 2016-12-09

## 2016-09-13 MED ORDER — CALCIUM CARBONATE-VITAMIN D 500-200 MG-UNIT PO TABS: 2.0000 | ORAL_TABLET | Freq: Three times a day (TID) | ORAL | Status: DC

## 2016-09-13 MED ORDER — MAGNESIUM OXIDE 400 (241.3 MG) MG PO TABS: 400.0000 mg | ORAL_TABLET | Freq: Two times a day (BID) | ORAL | 0 refills | Status: DC

## 2016-09-13 MED ORDER — HEPARIN SOD (PORK) LOCK FLUSH 100 UNIT/ML IV SOLN
500.0000 [IU] | INTRAVENOUS | Status: AC | PRN
  Administered 2016-09-13: 500 [IU]

## 2016-09-13 MED ORDER — FUROSEMIDE 20 MG PO TABS: 20.0000 mg | ORAL_TABLET | Freq: Two times a day (BID) | ORAL | Status: DC

## 2016-09-13 MED ORDER — DEXAMETHASONE 2 MG PO TABS: 2.0000 mg | ORAL_TABLET | Freq: Two times a day (BID) | ORAL | Status: DC

## 2016-09-13 MED ORDER — FAMOTIDINE 20 MG PO TABS: 20.0000 mg | ORAL_TABLET | Freq: Every day | ORAL | Status: DC

## 2016-09-13 MED ORDER — ERGOCALCIFEROL 8000 UNIT/ML PO SOLN: 4000.0000 [IU] | Freq: Every day | ORAL | 0 refills | Status: DC

## 2016-09-13 MED ORDER — POTASSIUM CHLORIDE CRYS ER 20 MEQ PO TBCR
40.0000 meq | EXTENDED_RELEASE_TABLET | Freq: Once | ORAL | Status: AC
  Administered 2016-09-13: 40 meq via ORAL
  Filled 2016-09-13: qty 2

## 2016-09-13 MED ORDER — PEDIASURE PEPTIDE 1.0 CAL PO LIQD: 237.0000 mL | Freq: Three times a day (TID) | ORAL | Status: DC

## 2016-09-13 MED ORDER — MAGNESIUM SULFATE 4 GM/100ML IV SOLN
4.0000 g | Freq: Once | INTRAVENOUS | Status: AC
  Administered 2016-09-13: 4 g via INTRAVENOUS
  Filled 2016-09-13: qty 100

## 2016-09-13 MED ORDER — ZINC OXIDE 40 % EX OINT: TOPICAL_OINTMENT | Freq: Every day | CUTANEOUS | 0 refills | Status: DC

## 2016-09-13 MED ORDER — BUDESONIDE 3 MG PO CPEP: 9.0000 mg | ORAL_CAPSULE | Freq: Every day | ORAL | 0 refills | Status: DC

## 2016-09-13 NOTE — Progress Notes (Signed)
Physical Therapy Treatment Patient Details Name: Cindy Byrd MRN: [id number] DOB: 08-Feb-1941 Today's Date: 10-06-16    History of Present Illness Cindy Byrd is a 75 y.o. female with metastatic non-small cell lung cancer under observation, hypertension, Barrett's esophagus, anemia and colitis  with persistent diarrhea presents to the ER 09/01/16 because of chest pain. Patient has been having pleuritic type of chest pain  CT angio reveals  acute bilateral PE and R leg DVT on 09/02/16.    PT Comments    Pt able to ambulate short distance today however fatigues quickly.  Pt reports plan to d/c to SNF today.  Follow Up Recommendations  SNF     Equipment Recommendations  None recommended by PT    Recommendations for Other Services       Precautions / Restrictions Precautions Precautions: Fall Restrictions Weight Bearing Restrictions: No    Mobility  Bed Mobility Overal bed mobility: Needs Assistance Bed Mobility: Supine to Sit     Supine to sit: Supervision     General bed mobility comments: reports dizziness upon sitting  Transfers Overall transfer level: Needs assistance Equipment used: Rolling walker (2 wheeled) Transfers: Sit to/from Stand Sit to Stand: Min guard         General transfer comment: min/guard for safety, verbal cues for hand placement  Ambulation/Gait Ambulation/Gait assistance: Min guard Ambulation Distance (Feet): 50 Feet Assistive device: Rolling walker (2 wheeled) Gait Pattern/deviations: Step-through pattern;Decreased stride length     General Gait Details: distance to tolerance, pt reports fatigue, pt also c/o dizziness however upon sitting she reports "not really dizzy maybe more like fatigue and weakness"   Stairs            Wheelchair Mobility    Modified Rankin (Stroke Patients Only)       Balance                                    Cognition Arousal/Alertness: Awake/alert Behavior During  Therapy: WFL for tasks assessed/performed Overall Cognitive Status: Within Functional Limits for tasks assessed                      Exercises      General Comments        Pertinent Vitals/Pain Pain Assessment: No/denies pain    Home Living                      Prior Function            PT Goals (current goals can now be found in the care plan section) Progress towards PT goals: Progressing toward goals    Frequency    Min 3X/week      PT Plan Current plan remains appropriate    Co-evaluation             End of Session Equipment Utilized During Treatment: Gait belt Activity Tolerance: Patient limited by fatigue Patient left: in chair;with call bell/phone within reach;with chair alarm set;with family/visitor present     Time: 4481-8563 PT Time Calculation (min) (ACUTE ONLY): 14 min  Charges:  $Gait Training: 8-22 mins                    G Codes:      Yukiko Minnich,KATHrine E 2016-10-06, 1:01 PM Carmelia Bake, PT, DPT 10-06-16 Pager: [phone]

## 2016-09-13 NOTE — Care Management Note (Signed)
Case Management Note  Patient Details  Name: Cindy Byrd MRN: [id number] Date of Birth: July 16, 1941  Subjective/Objective:                    Action/Plan:dc SNF.   Expected Discharge Date:   (unknown)               Expected Discharge Plan:  [place]  In-House Referral:     Discharge planning Services  CM Consult  Post Acute Care Choice:  Durable Medical Equipment (cane, rw,3n1) Choice offered to:     DME Arranged:    DME Agency:     HH Arranged:    HH Agency:     Status of Service:  Completed, signed off  If discussed at [place] of Stay Meetings, dates discussed:    Additional Comments:  Dessa Phi, RN 09/13/2016, 11:42 AM

## 2016-09-13 NOTE — Discharge Summary (Signed)
Physician Discharge Summary  Cindy Byrd KGM:[id number] DOB: [birth year] DOA: 09/01/2016  PCP: Tamsen Roers, MD  Admit date: 09/01/2016 Discharge date: 09/13/2016  Time spent: 65 minutes  Recommendations for Outpatient Follow-up:  1. Patient will be discharged to skilled nursing facility.  Palliative care will need to follow-up at the skilled nursing facility. Patient will follow-up with M.D. At the skilled nursing facility, patient will need a basic metabolic profile, magnesium level, phosphorus level checked in one week 2. Follow-up with Dr.Pyrtle, gastroenterology in 2-3 weeks.  On follow-up patient's diarrhea will need to be reassessed. Discussion on surveillance EGD for patient's Barrett's esophagus with high-grade dysplasia also need to be done at that time. 3. Follow-up with Dr. Irene Limbo, oncology in 2-3 weeks.   Discharge Diagnoses:  Principal Problem:   Bilateral pulmonary embolism (HCC) Active Problems:   HCAP (healthcare-associated pneumonia)   ARF (acute renal failure) (HCC)   Diarrhea   DVT of lower extremity, bilateral (HCC)   Protein calorie malnutrition (HCC)   HTN (hypertension)   Primary malignant neoplasm of lung metastatic to other site ([place])   Barrett's esophagus determined by biopsy   Nausea with vomiting   Hypokalemia   Pressure injury of skin   Cancer ([place])   Ulcerative pancolitis without complication ([place])   Palliative care by specialist   Goals of care, counseling/discussion   Advance care planning   Hypocalcemia   Vitamin D deficiency   Chest pain   New onset a-fib ([place])   Thrush of mouth and esophagus ([place])   Protein-calorie malnutrition, severe ([place])   Colitis determined by colorectal biopsy   Metastatic lung cancer (metastasis from lung to other site), unspecified laterality [facility])   Discharge Condition: stable and improved  Diet recommendation: heart healthy  Filed Weights   09/11/16 0500 09/12/16 0502 09/13/16 0550  Weight: 63 kg (138 lb  14.2 oz) 62.7 kg (138 lb 3.7 oz) 66.1 kg (145 lb 11.6 oz)    History of present illness:  Per Dr Ileene Patrick is a 75 y.o. 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 Patient had been having pleuritic type of chest pain over the last 2 days. Chest pain was mostly in the left lower part of the chest and in the left upper quadrant of the abdomen. 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 Chest x-ray in the ER showed possible infiltrates. Patient stated she had been having chest pain along with some productive cough but denied any fever or chills. Patient was being admitted for chest pain most likely from pneumonia.  ED Course: Patient was empirically started on antibiotics for pneumonia. Since patient also had hypokalemia potassium replacement was done.   Hospital Course:  #1 bilateral pulmonary emboli/bilateral DVT Likely secondary to metastatic lung cancer. Patient with some clinical improvement. CT angiogram chest 09/02/2016 with acute bilateral PE with evidence of right heart strain consistent at least with submassive PE. Bilateral lower extremity Dopplers for DVT in the right common femoral vein, right profunda femoris vein, right femoral vein, right popliteal vein, right posterior tibial vein,right peroneal vein, right gastrocnemius vein, left posterior tibial vein, left peroneal vein and left gastrocnemius vein. Patient placed on full dose Lovenox for pharmacy. Will likely need lifelong anticoagulation with Lovenox. 2-D echo with a EF of 55-60% with no wall motion abnormalities. Right ventricular systolic  function was normal. Will need outpatient follow-up with her hematologist/oncologist.  #2 healthcare associated pneumonia Patient is currently afebrile.  Some clinical improvement. WBCs normalized. Blood cultures with no growth to date. Sputum Gram stain and culture pending. IV antibiotics have been discontinued patient received a dose of oral Vantin and has completed a seven-day course of antibiotic treatment. No further antibiotics needed at this time.   #3 hypokalemia/hypophosphatemia/hypomagnesemia Likely secondary to GI losses. Replete. Keep magnesium greater than 2.  #4 ARF Likely secondary to ATN from IV contrast from CT angiogram 09/02/2016 and also prerenal azotemia due to volume depletion from GI losses from diarrhea. Creatinine currently at 0.56 from 0.83 from 1.14 from 1.46 from 1.82. Patient has been seen in consultation by nephrology.NSL IVF. Improvement with GI output. Appreciate nephrology input and recommendations.  #5 metabolic acidosis Likely secondary to acute renal failure and diarrhea. Improved with improved renal function. Patient was also placed on a bicarbonate drip during the hospitalization which was discontinued after metabolic acidosis is resolved.  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 Patient has been off Nivolumab 3 months have a never took Chad as direct it. Patient with a decrease in amount of stool after being started on budesonide yesterday per GI. Questran was also added to bowel regimen. Patient diarrhea improved during the hospitalization with budesonide as well as IV Decadron and Lialda  With Imodium as needed.  Patient was followed by GI during the hospitalization and will follow-up with them in outpatient setting. Volume of patient's stools decreased and improved significantly by day of discharge.  C. difficile PCR negative. GI pathogen panel negative. CT abdomen and pelvis with grossly stable metastases involving the left iliac bone with associated pathologic fracture. Fluid-filled small and large bowel  without significant distention no wall thickening. Mild sigmoid diverticulosis. New patchy airspace opacities of both lung bases. No other evidence of metastatic disease within the abdomen and pelvis. Patient was followed by GI and patient has been started on budesonide as well as questran with improvement with diarrhea. Patient on Lactinex, Lialda, and IV Decadron.IV Decadron will be transitioned to oral Decadron taper on discharge.patient will follow-up with her gastroenterologist Dr.Pyrtle in the outpatient setting in about 2-3 weeks.    #7 hypocalcemia/vitamin D deficiency Likely secondary to vitamin D deficiency and xgeva. Ionized calcium was 5.2. Corrected calcium was 7.96 on day of discharge. Patient's magnesium was repleted with oral magnesium as well as IV magnesium during the hospitalization. Patient will need to have this followed up in outpatient setting.today. Vitamin D 25-hydroxy levels at 10.9. Patient s/p  IV calcium gluconate  2 g due to worsening calcium and patient transient new onset A. Fib. Patient was placed on oral calcium with vitamin D. Repleted phosphorus. Vitamin D doses have been increased to 4000 units daily per oncology.   #8 transient new onset atrial fibrillation Likely secondary to electrolyte abnormalities of hypocalcemia, hypomagnesemia, hypophosphatemia and acute illness with bilateral PE, bilateral DVT, progressive diarrhea, acute renal failure. Patient converted back to normal sinus rhythm. Patient denied any chest pain or shortness of breath. TSH within normal limits at 2.016. 2-D echo with EF of 55-60%, no wall motion abnormalities, no valvular abnormalities 09/02/2016. Repleted electrolytes. Already on anticoagulation secondary to problem #1.  #9 Barrett's esophagus with high-grade dysplasia Per GI. Patient will likely need to follow-up with [facility] or GI in [place] for surveillance.  #10 metastatic non-small cell lung cancer with bilateral  lung nodules and  large metastatic lesion of the left ilium Patient is to follow-up with oncology, Dr. Irene Limbo as outpatient. Oncology saw the patient during the hospitalization and will follow-up in the outpatient setting.  #11 protein calorie malnutrition Nutritional supplementation.   Procedures: CT angiogram chest 09/02/2016 CT abdomen and pelvis 09/04/2016 Chest x-ray 09/01/2016 2-D echo 09/02/2016 Lower extremity Dopplers 09/02/2016   Consultations:  Nephrology: Dr.Schertz 09/06/2016  Gastroenterology: Dr. Silverio Decamp 09/05/2016  Palliative care: Dr. Rowe Pavy 09/07/2016   Discharge Exam: Vitals:   09/12/16 2135 09/13/16 0550  BP: (!) 146/67 (!) 159/75  Pulse: 70 76  Resp: 17 16  Temp: 98.3 F (36.8 C) 98.7 F (37.1 C)    General: NAD Cardiovascular: RRR Respiratory: CTAB  Discharge Instructions   Discharge Instructions    Diet - low sodium heart healthy    Complete by:  As directed    Discharge instructions    Complete by:  As directed    Follow up with Dr [name], GI in 2-3 weeks. Follow up with Dr Irene Limbo, in 2-3 weeks. Palliative care to follow at facility.   Increase activity slowly    Complete by:  As directed      Current Discharge Medication List    START taking these medications   Details  budesonide (ENTOCORT EC) 3 MG 24 hr capsule Take 3 capsules (9 mg total) by mouth daily. Qty: 30 capsule, Refills: 0    calcium-vitamin D (OSCAL WITH D) 500-200 MG-UNIT tablet Take 2 tablets by mouth 3 (three) times daily.    cholestyramine (QUESTRAN) 4 g packet Take 1 packet (4 g total) by mouth 2 (two) times daily. Qty: 60 each, Refills: 0    dexamethasone (DECADRON) 2 MG tablet Take 1-2 tablets (2-4 mg total) by mouth 2 (two) times daily with a meal. 2 tablets ('4mg'$ ) 2 times daily x 3 days, then 2 tablets ('4mg'$ ) daily x 3 days, then 1 tablet ('2mg'$ ) daily x 3 days then stop.    enoxaparin (LOVENOX) 60 MG/0.6ML injection Inject 0.6 mLs (60 mg total) into the skin every 12  (twelve) hours. Qty: 60 Syringe, Refills: 0    ergocalciferol (DRISDOL) 8000 UNIT/ML drops Take 0.5 mLs (4,000 Units total) by mouth daily. Qty: 60 mL, Refills: 0    famotidine (PEPCID) 20 MG tablet Take 1 tablet (20 mg total) by mouth daily.    feeding supplement, PEDIASURE PEPTIDE 1.0 CAL, (PEDIASURE PEPTIDE 1.0 CAL) LIQD Take 237 mLs by mouth 3 (three) times daily between meals.    liver oil-zinc oxide (DESITIN) 40 % ointment Apply topically daily. Qty: 56.7 g, Refills: 0    magnesium oxide (MAG-OX) 400 (241.3 Mg) MG tablet Take 1 tablet (400 mg total) by mouth 2 (two) times daily. Qty: 60 tablet, Refills: 0      CONTINUE these medications which have CHANGED   Details  furosemide (LASIX) 20 MG tablet Take 1 tablet (20 mg total) by mouth 2 (two) times daily. Qty: 30 tablet    LORazepam (ATIVAN) 1 MG tablet Take 1 tablet (1 mg total) by mouth every 8 (eight) hours as needed for anxiety (or nausea). Qty: 15 tablet, Refills: 0    oxyCODONE-acetaminophen (PERCOCET/ROXICET) 5-325 MG tablet Take 1 tablet by mouth every 4 (four) hours as needed for severe pain. Qty: 15 tablet, Refills: 0   Associated Diagnoses: Metastatic lung cancer (metastasis from lung to other site), unspecified laterality ([place])      CONTINUE these medications which have NOT CHANGED   Details  amLODipine (NORVASC) 5 MG tablet TAKE 1 TABLET (5 MG TOTAL) BY MOUTH DAILY. Qty: 30 tablet, Refills: 1    citalopram (CELEXA) 20 MG tablet Take 1 tablet (20 mg total) by mouth daily. Qty: 30 tablet, Refills: 3    clotrimazole-betamethasone (LOTRISONE) cream Apply 1 application topically 2 (two) times daily. Apply to perianal skin twice daily. Qty: 30 g, Refills: 1   Associated Diagnoses: Diarrhea, unspecified type    diphenoxylate-atropine (LOMOTIL) 2.5-0.025 MG tablet Take 1-2 tablets by mouth 4 (four) times daily as needed for diarrhea or loose stools. Qty: 60 tablet, Refills: 0   Associated Diagnoses: Metastatic  lung cancer (metastasis from lung to other site), unspecified laterality (HCC)    lactobacillus acidophilus & bulgar (LACTINEX) chewable tablet Chew 1 tablet by mouth 3 (three) times daily with meals. Qty: 60 tablet, Refills: 1    lidocaine-prilocaine (EMLA) cream Apply small amount over port 1-2 hours prior to treatment, cover with plastic wrap (DO NOT RUB IN). Qty: 30 g, Refills: prn    mesalamine (LIALDA) 1.2 g EC tablet Take 2 tablets (2.4 g total) by mouth daily with breakfast. Qty: 60 tablet, Refills: 3   Associated Diagnoses: Diarrhea, unspecified type    PRESCRIPTION MEDICATION Antibody Plan [facility]    zolpidem (AMBIEN CR) 12.5 MG CR tablet Take 1 tablet (12.5 mg total) by mouth at bedtime as needed for sleep. Qty: 30 tablet, Refills: 0   Associated Diagnoses: Metastatic lung cancer (metastasis from lung to other site), unspecified laterality (HCC)    NICOTINE STEP 1 21 MG/24HR patch PLACE 1 PATCH (21 MG TOTAL) ONTO THE SKIN DAILY. Qty: 28 patch, Refills: 0    omeprazole (PRILOSEC) 40 MG capsule TAKE ONE CAPSULE BY MOUTH EVERY DAY *NEEDS OFFICE VISIT FOR FURTHER REFILL* Qty: 90 capsule, Refills: 0    ondansetron (ZOFRAN) 8 MG tablet Take 1 tablet (8 mg total) by mouth every 8 (eight) hours as needed for nausea or vomiting. Qty: 30 tablet, Refills: 2   Associated Diagnoses: Dehydration; Hypokalemia    polycarbophil (FIBERCON) 625 MG tablet Take 2 tablets (1,250 mg total) by mouth 2 (two) times daily. Qty: 120 tablet, Refills: 1    potassium chloride SA (K-DUR,KLOR-CON) 20 MEQ tablet Take 1 tablet (20 mEq total) by mouth daily. While on lasix Qty: 10 tablet, Refills: 0   Associated Diagnoses: Dehydration; Hypokalemia    valsartan (DIOVAN) 80 MG tablet Take 80 mg by mouth daily.       STOP taking these medications     potassium chloride (MICRO-K) 10 MEQ CR capsule      predniSONE (DELTASONE) 20 MG tablet        Allergies  Allergen Reactions  . Penicillins Other (See  Comments)    Unknown; childhood allergy  . Remeron [Mirtazapine] Other (See Comments)    nightmares  . Latex Rash   Follow-up Information    PYRTLE, [name], MD. Schedule an appointment as soon as possible for a visit in 3 week(s).   Specialty:  Gastroenterology Why:  f/u in 2-3 weeks. Contact information: [address] [place] [address] [phone]        Gautam Kale, MD. Schedule an appointment as soon as possible for a visit in 3 week(s).   Specialties:  Hematology, Oncology Why:  f/u in 2-3 weeks. Contact information: [place] [address] [phone]        Palliative Care .   Why:  Palliative Care to follow at facility  The results of significant diagnostics from this hospitalization (including imaging, microbiology, ancillary and laboratory) are listed below for reference.    Significant Diagnostic Studies: Ct Abdomen Pelvis Wo Contrast  Result Date: 09/04/2016 CLINICAL DATA:  Metastatic non-small-cell lung cancer under observation. History of Barrett's esophagus, anemia and colitis. History of esophageal adenocarcinoma. EXAM: CT ABDOMEN AND PELVIS WITHOUT CONTRAST TECHNIQUE: Multidetector CT imaging of the abdomen and pelvis was performed following the standard protocol without IV contrast. COMPARISON:  CT 06/24/2016 and 04/19/2016. FINDINGS: Lower chest: New patchy dependent airspace opacities in both lower lobes. There is no significant pleural or pericardial effusion. Hepatobiliary: There is decreased hepatic density consistent with steatosis. Multiple low-density hepatic lesions are grossly stable, likely cysts. No new or enlarging lesions are identified. The gallbladder is distended without wall thickening. No evidence of gallstones, surrounding inflammation or biliary dilatation. Pancreas: Unremarkable. No pancreatic ductal dilatation or surrounding inflammatory changes. Spleen: Normal in size without focal abnormality.  Adrenals/Urinary Tract: Both adrenal glands appear normal. Both kidneys appear normal. There is no evidence of renal mass, hydronephrosis or urinary tract calculus. Left ovarian vein phleboliths are grossly stable. There is high-density material within the urinary bladder. No focal urothelial lesions are seen. Stomach/Bowel: The stomach is decompressed. There is fluid throughout the small bowel and colon. No bowel wall thickening or surrounding inflammatory change identified. The appendix appears normal. There are mild diverticular changes of the sigmoid colon. Rectal tube in place. Vascular/Lymphatic: There are no enlarged abdominal or pelvic lymph nodes. Aortic and branch vessel atherosclerosis. Reproductive: Hysterectomy.  No evidence of adnexal mass. Other: No ascites, peritoneal nodularity or free air. There is soft tissue stranding and subcutaneous air within the subcutaneous fat of the left anterior abdominal wall, presumably from subcutaneous injections. Musculoskeletal: Grossly stable destructive mass involving the left iliac bone with associated pathologic fracture. No new osseous lesions or new fractures are seen. IMPRESSION: 1. Grossly stable metastasis involving the left iliac bone with associated pathologic fracture. 2. No other evidence of metastatic disease within the abdomen or pelvis. 3. New patchy airspace opacities at both lung bases may reflect atelectasis or aspiration. Correlate clinically. 4. Fluid-filled small and large bowel without significant distention or wall thickening. Mild sigmoid diverticulosis. Electronically Signed   By: Richardean Sale M.D.   On: 09/04/2016 17:34   Dg Chest 2 View  Result Date: 09/01/2016 CLINICAL DATA:  Left lower chest abdominal pain beginning today. Dyspnea. Metastatic lung carcinoma. EXAM: CHEST  2 VIEW COMPARISON:  10/03/2014 FINDINGS: The heart size and mediastinal contours remain within normal limits. Aortic atherosclerosis. Right-sided power port  remains in appropriate position. New airspace opacity is seen in the left lower lobe, suspicious for pneumonia. There is new linear opacity in the right lower lung, suspicious for atelectasis or scarring. No evidence of pneumothorax or pleural effusion. IMPRESSION: New airspace opacity in left lower lobe, suspicious for pneumonia. New right lower lung atelectasis versus scarring. Electronically Signed   By: Earle Gell M.D.   On: 09/01/2016 17:53   Ct Head Wo Contrast  Result Date: 09/07/2016 CLINICAL DATA:  Altered mental status, history of esophageal cancer, rule out metastasis EXAM: CT HEAD WITHOUT CONTRAST TECHNIQUE: Contiguous axial images were obtained from the base of the skull through the vertex without intravenous contrast. COMPARISON:  07/20/2015 CT FINDINGS: Brain: Sulcal prominence over the convexity of the brain consistent with superficial atrophy. No significant central atrophy. No focus of hemorrhage, midline shift or edema. Ventricles are not dilated. No acute infarction. Lack of  IV contrast limits assessment for metastatic disease. Vascular: Minimal atherosclerosis of the cavernous internal carotids. Skull: No lytic or blastic disease.  No acute osseous abnormality. Sinuses/Orbits: Bilateral ocular lens surgical change. Mucosal thickening in the sphenoid and posterior ethmoid sinus. No mastoid effusion. Other: None IMPRESSION: Superficial atrophy. No acute intracranial abnormality. No findings of metastatic disease to the brain however lack of IV contrast limits assessment. Electronically Signed   By: Ashley Royalty M.D.   On: 09/07/2016 13:48   Ct Angio Chest Pe W Or Wo Contrast  Result Date: 09/02/2016 CLINICAL DATA:  Pt with metastatic non-small cell lung cancer under observation, hypertension, Barrett's esophagus, anemia and colitis secondary to Nivolumab with persistent diarrhea, presents to the ER because of chest pain. EXAM: CT ANGIOGRAPHY CHEST WITH CONTRAST TECHNIQUE: Multidetector CT  imaging of the chest was performed using the standard protocol during bolus administration of intravenous contrast. Multiplanar CT image reconstructions and MIPs were obtained to evaluate the vascular anatomy. CONTRAST:  100 mL Isovue 370 COMPARISON:  CT chest 06/24/2016, 04/19/2016, 01/14/2016 FINDINGS: Cardiovascular: Satisfactory opacification of the pulmonary arteries to the segmental level. Occlusive pulmonary embolus in the left lower lobe pulmonary artery. Pulmonary embolus in the right lower lobe segmental pulmonary artery. Normal heart size. No pericardial effusion. Thoracic aortic atherosclerosis. Mediastinum/Nodes: No enlarged mediastinal, hilar, or axillary lymph nodes. Thyroid gland, trachea, and esophagus demonstrate no significant findings. Lungs/Pleura: Small left pleural effusion. Right basilar airspace disease which may reflect atelectasis versus pneumonia versus pulmonary infarction. Left basilar disease which may reflect atelectasis versus pneumonia versus less likely developing pulmonary infarction. Bilateral centrilobular emphysema. Upper Abdomen: No acute upper abdominal abnormality. Small hiatal hernia. Musculoskeletal: No acute osseous abnormality. No aggressive lytic or sclerotic osseous lesion Review of the MIP images confirms the above findings. IMPRESSION: 1. Acute bilateral pulmonary emboli as described above. Positive for acute PE with CT evidence of right heart strain (RV/LV Ratio = 1.07) consistent with at least submassive (intermediate risk) PE. The presence of right heart strain has been associated with an increased risk of morbidity and mortality. Please activate Code PE by paging [phone]. 2. Right basilar airspace disease which may reflect atelectasis versus pneumonia versus pulmonary infarction. Left basilar disease which may reflect atelectasis versus pneumonia versus less likely developing pulmonary infarction. 3.  Aortic Atherosclerosis (ICD10-170.0) 4.  Emphysema.  ([id number]-G54.9) Critical Value/emergent results were called by telephone at the time of interpretation on 09/02/2016 at 10:06 am to Dr. Karleen Hampshire, who verbally acknowledged these results. Electronically Signed   By: Kathreen Devoid   On: 09/02/2016 10:07    Microbiology: Recent Results (from the past 240 hour(s))  Culture, blood (Routine X 2) w Reflex to ID Panel     Status: None   Collection Time: 09/03/16  6:05 PM  Result Value Ref Range Status   Specimen Description BLOOD RIGHT ARM  Final   Special Requests BOTTLES DRAWN AEROBIC AND ANAEROBIC 5CC  Final   Culture   Final    NO GROWTH 5 DAYS Performed at [facility]    Report Status 09/09/2016 FINAL  Final  Culture, blood (Routine X 2) w Reflex to ID Panel     Status: None   Collection Time: 09/03/16  6:11 PM  Result Value Ref Range Status   Specimen Description BLOOD RIGHT ARM  Final   Special Requests BOTTLES DRAWN AEROBIC ONLY 5CC  Final   Culture   Final    NO GROWTH 5 DAYS Performed at [facility]  Report Status 09/09/2016 FINAL  Final  Gastrointestinal Panel by PCR , Stool     Status: None   Collection Time: 09/06/16 10:44 AM  Result Value Ref Range Status   Campylobacter species NOT DETECTED NOT DETECTED Final   Plesimonas shigelloides NOT DETECTED NOT DETECTED Final   Salmonella species NOT DETECTED NOT DETECTED Final   Yersinia enterocolitica NOT DETECTED NOT DETECTED Final   Vibrio species NOT DETECTED NOT DETECTED Final   Vibrio cholerae NOT DETECTED NOT DETECTED Final   Enteroaggregative E coli (EAEC) NOT DETECTED NOT DETECTED Final   Enteropathogenic E coli (EPEC) NOT DETECTED NOT DETECTED Final   Enterotoxigenic E coli (ETEC) NOT DETECTED NOT DETECTED Final   Shiga like toxin producing E coli (STEC) NOT DETECTED NOT DETECTED Final   Shigella/Enteroinvasive E coli (EIEC) NOT DETECTED NOT DETECTED Final   Cryptosporidium NOT DETECTED NOT DETECTED Final   Cyclospora cayetanensis NOT DETECTED NOT DETECTED  Final   Entamoeba histolytica NOT DETECTED NOT DETECTED Final   Giardia lamblia NOT DETECTED NOT DETECTED Final   Adenovirus F40/41 NOT DETECTED NOT DETECTED Final   Astrovirus NOT DETECTED NOT DETECTED Final   Norovirus GI/GII NOT DETECTED NOT DETECTED Final   Rotavirus A NOT DETECTED NOT DETECTED Final   Sapovirus (I, II, IV, and V) NOT DETECTED NOT DETECTED Final     Labs: Basic Metabolic Panel:  Recent Labs Lab 09/09/16 0400 09/09/16 1430 09/10/16 0415 09/11/16 0500 09/12/16 0730 09/13/16 0420  NA 133* 133* 136 137 135 136  K 2.9* 2.6* 3.0* 3.2* 3.4* 3.6  CL 101 101 105 104 100* 99*  CO2 20* '22 25 28 30 30  '$ GLUCOSE 97 104* 127* 112* 111* 107*  BUN 21* '19 14 8 6 7  '$ CREATININE 1.14* 1.01* 0.83 0.56 0.49 0.55  CALCIUM 5.0* 4.9* 5.6* 5.9* 5.9* 6.2*  MG 2.2  --  1.5* 1.6* 1.6* 1.6*  PHOS 3.0  --  1.3* 1.4* 1.8* 2.7   Liver Function Tests:  Recent Labs Lab 09/08/16 0335 09/10/16 0415  AST 51* 36  ALT 42 31  ALKPHOS 79 84  BILITOT 1.0 0.7  PROT 4.3* 4.8*  ALBUMIN 1.6* 1.8*   No results for input(s): LIPASE, AMYLASE in the last 168 hours. No results for input(s): AMMONIA in the last 168 hours. CBC:  Recent Labs Lab 09/08/16 0335 09/09/16 0400 09/10/16 0415 09/12/16 0730  WBC 2.7* 1.4* 1.6* 2.1*  NEUTROABS  --   --  0.7*  --   HGB 9.8* 9.6* 8.8* 8.2*  HCT 28.9* 27.8* 26.0* 24.8*  MCV 80.3 79.7 81.3 81.6  PLT 206 229 240 245   Cardiac Enzymes: No results for input(s): CKTOTAL, CKMB, CKMBINDEX, TROPONINI in the last 168 hours. BNP: BNP (last 3 results) No results for input(s): BNP in the last 8760 hours.  ProBNP (last 3 results) No results for input(s): PROBNP in the last 8760 hours.  CBG: No results for input(s): GLUCAP in the last 168 hours.     SignedIrine Seal MD.  [facility] 09/13/2016, 12:35 PM

## 2016-09-13 NOTE — Progress Notes (Signed)
Called [facility] to give report. Front desk stated they would take my name and call back. Patient was given discharge paperwork and husband picked her up and was transported via his car.

## 2016-09-13 NOTE — Clinical Social Work Placement (Signed)
Patient is set to discharge to [facility] today. Patient & daughter, Lars Pinks aware. Discharge packet given to RN, Sonia Baller. Patient's significant other, Tommy to transport to SNF.     Raynaldo Opitz, [facility] Clinical Social Worker cell #: [phone]    CLINICAL SOCIAL WORK PLACEMENT  NOTE  Date:  09/13/2016  Patient Details  Name: Cindy Byrd MRN: [id number] Date of Birth: 07-May-1941  Clinical Social Work is seeking post-discharge placement for this patient at the [place] level of care (*CSW will initial, date and re-position this form in  chart as items are completed):  Yes   Patient/family provided with [place] Work Department's list of facilities offering this level of care within the geographic area requested by the patient (or if unable, by the patient's family).  Yes   Patient/family informed of their freedom to choose among providers that offer the needed level of care, that participate in Medicare, Medicaid or managed care program needed by the patient, have an available bed and are willing to accept the patient.  Yes   Patient/family informed of [place]'s ownership interest in [facility] and [facility], as well as of the fact that they are under no obligation to receive care at these facilities.  PASRR submitted to EDS on 09/09/16     PASRR number received on 09/09/16     Existing PASRR number confirmed on       FL2 transmitted to all facilities in geographic area requested by pt/family on       FL2 transmitted to all facilities within larger geographic area on 09/09/16     Patient informed that his/her managed care company has contracts with or will negotiate with certain facilities, including the following:        Yes   Patient/family informed of bed offers received.  Patient chooses bed at [place] recommends and patient chooses bed at      Patient  to be transferred to [facility] on 09/13/16.  Patient to be transferred to facility by patient's significant other, Tommy     Patient family notified on 09/13/16 of transfer.  Name of family member notified:  patient's daughter, Delia via phone     PHYSICIAN       Additional Comment:    _______________________________________________ Standley Brooking, LCSW 09/13/2016, 2:14 PM

## 2016-09-13 NOTE — Progress Notes (Signed)
[facility] for Phosphate Replacement Indication: Hypophosphatemia  Allergies  Allergen Reactions  . Penicillins Other (See Comments)    Unknown; childhood allergy  . Remeron [Mirtazapine] Other (See Comments)    nightmares  . Latex Rash   Patient Measurements: Height: '5\' 8"'$  (172.7 cm) Weight: 145 lb 11.6 oz (66.1 kg) IBW/kg (Calculated) : 63.9  Vital Signs: Temp: 98.7 F (37.1 C) (10/10 0550) Temp Source: Oral (10/10 0550) BP: 159/75 (10/10 0550) Pulse Rate: 76 (10/10 0550) Intake/Output from previous day: 10/09 0701 - 10/10 0700 In: 480 [P.O.:480] Out: 7 [Urine:4; Stool:3] Intake/Output from this shift: No intake/output data recorded.  Labs:  Recent Labs  09/11/16 0500 09/12/16 0730 09/13/16 0420  WBC  --  2.1*  --   HGB  --  8.2*  --   HCT  --  24.8*  --   PLT  --  245  --   CREATININE 0.56 0.49 0.55  MG 1.6* 1.6* 1.6*  PHOS 1.4* 1.8* 2.7   Estimated Creatinine Clearance: 60.4 mL/min (by C-G formula based on SCr of 0.55 mg/dL).  Assessment: 86 y/oF presented to ED on 09/01/16 with chest pain. 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 Patient currently not receiving any chemotherapy but is receiving Xgeva for bone mets. Patient known to pharmacy for antibiotic dosing for HCAP and lovenox dosing for PE. Pharmacy now consulted to assist with phosphate replacement. Of note, patient now with AKI likely secondary to ATN from IV contrast and prerenal azotemia due to volume depletion secondary to high output diarrhea.   Today, 09/13/16: - SCr: 0.55  - K+: 3.6 - TRH  PO replacement - Mag 1.6 - TRH replacement - Corr Ca = 7.8 -  TRH IV and PO replacement - Na = 136  - Phos: incr to 2.7 mg/dL   Plan:   Due to improved Na and continued low potassium give potassium  phosphate 20 mmol IV x 1   No Phosphorus replacement  needed today  Minda Ditto PharmD Pager [phone] 09/13/2016, 9:04 AM

## 2016-09-13 NOTE — Care Management Important Message (Signed)
Important Message  Patient Details  Name: LEVERNE TESSLER MRN: [id number] Date of Birth: 1941-05-10   Medicare Important Message Given:  Yes    Camillo Flaming 09/13/2016, 10:04 AMImportant Message  Patient Details  Name: ALYSHIA KERNAN MRN: [id number] Date of Birth: Nov 12, 1941   Medicare Important Message Given:  Yes    Camillo Flaming 09/13/2016, 10:04 AM

## 2016-09-14 ENCOUNTER — Other Ambulatory Visit: Payer: Self-pay | Admitting: *Deleted

## 2016-09-15 ENCOUNTER — Encounter: Payer: Self-pay | Admitting: Internal Medicine

## 2016-09-15 ENCOUNTER — Non-Acute Institutional Stay (SKILLED_NURSING_FACILITY): Payer: Medicare Other | Admitting: Internal Medicine

## 2016-09-15 DIAGNOSIS — C3490 Malignant neoplasm of unspecified part of unspecified bronchus or lung: Secondary | ICD-10-CM | POA: Diagnosis not present

## 2016-09-15 DIAGNOSIS — I824Y3 Acute embolism and thrombosis of unspecified deep veins of proximal lower extremity, bilateral: Secondary | ICD-10-CM | POA: Diagnosis not present

## 2016-09-15 DIAGNOSIS — R197 Diarrhea, unspecified: Secondary | ICD-10-CM | POA: Diagnosis not present

## 2016-09-15 DIAGNOSIS — I2699 Other pulmonary embolism without acute cor pulmonale: Secondary | ICD-10-CM

## 2016-09-15 NOTE — Assessment & Plan Note (Signed)
As per Dr Irene Limbo

## 2016-09-15 NOTE — Patient Instructions (Signed)
Monitor chemistries and electrolytes as per discharge recommendations

## 2016-09-15 NOTE — Assessment & Plan Note (Signed)
Aggressive antidiarrheal regimen necessary to prevent rehospitalization The chemistries and electrolytes will be monitored

## 2016-09-15 NOTE — Progress Notes (Signed)
Dr. Unice Cobble [address] [place] [address] [facility] Room: [address]  PCP:Dr. Tamsen Roers  Chief Complaint  Patient presents with  . New Admit To SNF    New Admit to [facility]   Allergies  Allergen Reactions  . Penicillins Other (See Comments)    Unknown; childhood allergy  . Remeron [Mirtazapine] Other (See Comments)    nightmares  . Latex Rash    This is a comprehensive admission note to [facility] performed on this date less than 30 days from date of admission. Included are preadmission medical/surgical history;reconciled medication list; family history; social history and comprehensive review of systems.  Corrections and additions to the records were documented . Comprehensive physical exam was also performed. Additionally a clinical summary was entered for each active diagnosis pertinent to this admission in the Problem List to enhance continuity of care.  HPI:The patient was hospitalized 9/28-10/10/17 .Presentation was chest pain, pleuritic in nature over the previous 48 hours. 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 Chest x-ray suggested possible infiltrates although the patient had no fever or chills. She had had some productive cough. She was admitted with chest pain in the context of possible pneumonia and metastatic non-small cell lung cancer which is under observation. CT angiogram 9/29 revealed acute bilateral PE with right heart strain consistent with a submassive pulmonary emboli . She had bilateral  EXTENSIVE lower extremity DVTs.Full dose Lovenox was initiated;it was felt that this would need to be lifelong as embolic phenomena were felt to be a reflection of hypercoagulability due to the lung cancer. Acute renal failure was thought to be related to ATN from the IV contrast with CT angiogram and also prerenal azotemia due to volume  depletion from GI losses from diarrhea. Highest creatinine was 1.82. It was 0.56 @ discharge. She was seen by Nephrology. 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 She had been off that medicine for 3 months. She was also supposed to take Lialda but apparently did not. Budesonide, Questran , Decadron, Lialda Imodium as needed were initiated. She will be followed up as an outpatient with by GI. Extensive stool studies revealed no C. difficile or other GI pathogens. CT did show mild sigmoid diverticulosis. She was severely hypocalcemic with a value of 5.2. Calcium 7.96 at discharge. Hypomagnesemia was resolved. Hospitalization was complicated by new onset atrial fibrillation most likely attributed to the chemistry imbalances as noted above. She did convert to sinus rhythm. Follow-up by Dr.Kale for the metastatic non-small cell cancer will be completed an outpatient. Palliative care consulted  09/07/16.     Past medical and surgical history: She questions a diagnosis of Crohn's disease. There is no such definitive diagnosis in the chart. She has history of Barrett's esophagus, vit D deficiency, irritable bowel, hypertension, dyslipidemia, diverticulosis. She has had multiple upper and lower endoscopic studies.  Social history: She does not drink. She quit smoking in January of this year.  Family history: Positive for colon cancer in her father and 3 sibs.  Review of systems: She states that her diarrhea is slightly better with the medications. She states she is able to make it to the bathroom for elimination most occasions. She admits to depression being in this facility. She denies other active symptoms. Constitutional: No fever,significant weight change, fatigue  Eyes: No redness, discharge, pain, vision change ENT/mouth: No nasal  congestion,  purulent discharge, earache,change in hearing ,sore throat  Cardiovascular: No chest pain,  palpitations,paroxysmal nocturnal dyspnea, claudication, edema  Respiratory: No cough, sputum production,hemoptysis, DOE , significant snoring,apnea  Gastrointestinal: No heartburn,dysphagia,abdominal pain, nausea / vomiting,rectal bleeding, melena, or clay colored stools. Genitourinary: No dysuria,hematuria, pyuria,  incontinence, nocturia Musculoskeletal: No joint stiffness, joint swelling, weakness,pain Dermatologic: No rash, pruritus, change in appearance of skin Neurologic: No dizziness,headache,syncope, seizures, numbness , tingling Psychiatric: No significant anxiety , depression, insomnia, anorexia Endocrine: No change in hair/skin/ nails, excessive thirst, excessive hunger, excessive urination  Hematologic/lymphatic: No significant bruising, lymphadenopathy,abnormal bleeding Allergy/immunology: No itchy/ watery eyes, significant sneezing, urticaria, angioedema  Physical exam:  Pertinent or positive findings: The most striking physical finding is how well she looks despite the incredible story. She has upper and lower partials. Breath sounds are decreased. Pedal pulses are decreased She does have some atrophy of the limbs. General appearance:Adequately nourished; no acute distress , increased work of breathing is present.   Lymphatic: No lymphadenopathy about the head, neck, axilla . Eyes: No conjunctival inflammation or lid edema is present. There is no scleral icterus. Ears:  External ear exam shows no significant lesions or deformities.   Nose:  External nasal examination shows no deformity or inflammation. Nasal mucosa are pink and moist without lesions ,exudates Oral exam: lips and gums are healthy appearing.There is no oropharyngeal erythema or exudate . Neck:  No thyromegaly, masses, tenderness noted.    Heart:  Normal rate and regular rhythm. S1 and S2 normal without gallop, murmur, click, rub .  Lungs:Chest clear to auscultation without wheezes, rhonchi,rales ,  rubs. Abdomen:Bowel sounds are normal. Abdomen is soft and nontender with no organomegaly, hernias,masses. GU: deferred as previously addressed. Extremities:  No cyanosis, clubbing,edema  Neurologic exam : Strength equal  in upper & lower extremities Balance,Rhomberg,finger to nose testing could not be completed due to clinical state Deep tendon reflexes are equal Skin: Warm & dry w/o tenting. No significant lesions or rash.  See clinical summary under each active problem in the Problem List with associated updated therapeutic plan

## 2016-09-15 NOTE — Assessment & Plan Note (Signed)
Continue Lovenox 

## 2016-09-15 NOTE — Assessment & Plan Note (Signed)
See PTE assessment and recommendations

## 2016-09-16 ENCOUNTER — Encounter: Payer: Self-pay | Admitting: Nurse Practitioner

## 2016-09-16 ENCOUNTER — Non-Acute Institutional Stay (SKILLED_NURSING_FACILITY): Payer: Medicare Other | Admitting: Nurse Practitioner

## 2016-09-16 DIAGNOSIS — C3490 Malignant neoplasm of unspecified part of unspecified bronchus or lung: Secondary | ICD-10-CM | POA: Diagnosis not present

## 2016-09-16 DIAGNOSIS — I10 Essential (primary) hypertension: Secondary | ICD-10-CM | POA: Diagnosis not present

## 2016-09-16 DIAGNOSIS — R110 Nausea: Secondary | ICD-10-CM

## 2016-09-16 DIAGNOSIS — R197 Diarrhea, unspecified: Secondary | ICD-10-CM

## 2016-09-16 DIAGNOSIS — I2699 Other pulmonary embolism without acute cor pulmonale: Secondary | ICD-10-CM

## 2016-09-16 DIAGNOSIS — E876 Hypokalemia: Secondary | ICD-10-CM | POA: Diagnosis not present

## 2016-09-16 MED ORDER — VALSARTAN 80 MG PO TABS: 80.0000 mg | ORAL_TABLET | Freq: Every day | ORAL | 0 refills | Status: DC

## 2016-09-16 MED ORDER — BUDESONIDE 3 MG PO CPEP: 9.0000 mg | ORAL_CAPSULE | Freq: Every day | ORAL | 0 refills | Status: DC

## 2016-09-16 MED ORDER — ENOXAPARIN SODIUM 60 MG/0.6ML ~~LOC~~ SOLN: 60.0000 mg | Freq: Two times a day (BID) | SUBCUTANEOUS | 0 refills | Status: DC

## 2016-09-16 MED ORDER — MAGNESIUM OXIDE 400 (241.3 MG) MG PO TABS: 400.0000 mg | ORAL_TABLET | Freq: Two times a day (BID) | ORAL | 0 refills | Status: DC

## 2016-09-16 MED ORDER — CITALOPRAM HYDROBROMIDE 20 MG PO TABS: 20.0000 mg | ORAL_TABLET | Freq: Every day | ORAL | 0 refills | Status: DC

## 2016-09-16 MED ORDER — OMEPRAZOLE 20 MG PO CPDR: 20.0000 mg | DELAYED_RELEASE_CAPSULE | Freq: Every day | ORAL | 0 refills | Status: DC

## 2016-09-16 MED ORDER — AMLODIPINE BESYLATE 5 MG PO TABS: 5.0000 mg | ORAL_TABLET | Freq: Every day | ORAL | 0 refills | Status: DC

## 2016-09-16 MED ORDER — FUROSEMIDE 20 MG PO TABS: 20.0000 mg | ORAL_TABLET | Freq: Two times a day (BID) | ORAL | 0 refills | Status: DC

## 2016-09-16 MED ORDER — POTASSIUM CHLORIDE CRYS ER 20 MEQ PO TBCR: 20.0000 meq | EXTENDED_RELEASE_TABLET | Freq: Every day | ORAL | 0 refills | Status: DC

## 2016-09-16 MED ORDER — SACCHAROMYCES BOULARDII 250 MG PO CAPS: 250.0000 mg | ORAL_CAPSULE | Freq: Two times a day (BID) | ORAL | 0 refills | Status: DC

## 2016-09-16 MED ORDER — ONDANSETRON HCL 8 MG PO TABS: 8.0000 mg | ORAL_TABLET | Freq: Three times a day (TID) | ORAL | 0 refills | Status: DC | PRN

## 2016-09-16 MED ORDER — MESALAMINE 1.2 G PO TBEC: 2.4000 g | DELAYED_RELEASE_TABLET | Freq: Every day | ORAL | 0 refills | Status: DC

## 2016-09-16 MED ORDER — DEXAMETHASONE 2 MG PO TABS: ORAL_TABLET | ORAL | 0 refills | Status: DC

## 2016-09-16 MED ORDER — CHOLESTYRAMINE 4 G PO PACK: 4.0000 g | PACK | Freq: Two times a day (BID) | ORAL | 0 refills | Status: DC

## 2016-09-16 NOTE — Progress Notes (Signed)
Location:  [place] Room Number: 215 A Place of Service:  SNF (31)  Provider: Tamsen Roers, MD  PCP: Tamsen Roers, MD Patient Care Team: Tamsen Roers, MD as PCP - General (Family Medicine) [name], MD as Consulting Physician (Hematology and Oncology)  Extended Emergency Contact Information Primary Emergency Contact: [place], [place] [address] Johnnette Litter of [place] Phone: [phone] Mobile Phone: [phone] Relation: Daughter Secondary Emergency Contact: Marcene Duos, [place] [address] Johnnette Litter of [place] Phone: [phone] Mobile Phone: [phone] Relation: Son  Goals of care:  Advanced Directive information Advanced Directives 09/16/2016  Does patient have an advance directive? Yes  Type of Advance Directive Out of facility DNR (pink MOST or yellow form)  Does patient want to make changes to advanced directive? -  Copy of advanced directive(s) in chart? Yes  Would patient like information on creating an advanced directive? -     Allergies  Allergen Reactions  . Penicillins Other (See Comments)    Unknown; childhood allergy  . Remeron [Mirtazapine] Other (See Comments)    nightmares  . Latex Rash    Chief Complaint  Patient presents with  . Discharge Note    HPI:  Pt is a 75 y.o. female who is being seen today for discharge home. Pt with hx of metastatic non-small cell lung cancer under observation, hypertension, Barrett's esophagus, anemia and colitis secondary to Nivolumabwith persistent diarrhea. Pt is at [facility] for rehabilitation after hospitalization for bilateral PE/DVT thought to be secondary to lung disease, HCAP and abnormal electrolytes due to persistent diarrhea.  Pt requesting to leave with family at this time. Pt has been able to get up and go to the bathroom. She is not having any pain and able to move around without becoming short of breath. Pt does not  like being in facility so family will bring her home with support.    Past Medical History:  Diagnosis Date  . Barrett's esophagus   . Bone neoplasm 06/24/2015  . Cancer [facility])    metastatic poorly differentiated carcinoma. tumor left groin surgical removal with radiation tx.  . Cataract    BILATERAL  . Cigarette smoker two packs a day or less    Currently still smoking 2 PPD - Not interested in quitting at this time.  . Colitis 2017  . Colon polyps    hyperplastic, tubular adenomas, tubulovillous adenoma  . Cough, persistent    hx. lung cancer ? primary-being evaluated, unsure of primary site.  . Depression 06/24/2015  . Diverticulosis   . Emphysema of lung ([place])   . Endometriosis    Hysterectomy with BSO at age 54 yrs  . Esophageal adenocarcinoma ([place]) 08/11/15   intramucosal  . Gastritis   . GERD (gastroesophageal reflux disease)   . H/O: pneumonia   . Hiatal hernia   . Hyperlipidemia   . Hypertension 06/24/2015   likely improved incidental to 40 lbs weight loss from her neoplasm. No Longer taking med for this as of 08-06-15  . IBS (irritable bowel syndrome)   . Pain    left hip-persistent"tumor of bone"-radiation tx. 10.  . Vitamin D deficiency disease     Past Surgical History:  Procedure Laterality Date  . BARTHOLIN GLAND CYST EXCISION  75 yo ago   Does not want if it was an infected cyst or tumor. Was soon as delivery  . COLONOSCOPY  W/ POLYPECTOMY     multiple times - last done 09/2014 per patient.  . ESOPHAGOGASTRODUODENOSCOPY (EGD) WITH PROPOFOL N/A 08/11/2015   Procedure: ESOPHAGOGASTRODUODENOSCOPY (EGD) WITH PROPOFOL;  Surgeon: [name], MD;  Location: [facility] ENDOSCOPY;  Service: Gastroenterology;  Laterality: N/A;  . GANGLION CYST EXCISION    . KNEE ARTHROSCOPY  age about 45 yrs  . TONSILLECTOMY    . TOTAL ABDOMINAL HYSTERECTOMY W/ BILATERAL SALPINGOOPHORECTOMY  at age 24 yrs   For endometriosis      reports that she quit smoking about 9 months ago. Her smoking  use included Cigarettes. She has a 60.00 pack-year smoking history. She has never used smokeless tobacco. She reports that she does not drink alcohol or use drugs. Social History   Social History  . Marital status: Widowed    Spouse name: N/A  . Number of children: 2  . Years of education: N/A   Occupational History  . Not on file.   Social History Main Topics  . Smoking status: Former Smoker    Packs/day: 1.00    Years: 60.00    Types: Cigarettes    Quit date: 12/06/2015  . Smokeless tobacco: Never Used  . Alcohol use No  . Drug use: No  . Sexual activity: No   Other Topics Concern  . Not on file   Social History Narrative  . No narrative on file   Functional Status Survey:    Allergies  Allergen Reactions  . Penicillins Other (See Comments)    Unknown; childhood allergy  . Remeron [Mirtazapine] Other (See Comments)    nightmares  . Latex Rash    Pertinent  Health Maintenance Due  Topic Date Due  . PNA vac Low Risk Adult (1 of 2 - PCV13) 01/24/2005  . DEXA SCAN  Completed    Medications:   Medication List       Accurate as of 09/16/16 11:48 AM. Always use your most recent med list.          amLODipine 5 MG tablet Commonly known as:  NORVASC TAKE 1 TABLET (5 MG TOTAL) BY MOUTH DAILY.   budesonide 3 MG 24 hr capsule Commonly known as:  ENTOCORT EC Take 3 capsules (9 mg total) by mouth daily.   calcium-vitamin D 500-200 MG-UNIT tablet Commonly known as:  OSCAL WITH D Take 2 tablets by mouth 3 (three) times daily.   cholestyramine 4 g packet Commonly known as:  QUESTRAN Take 1 packet (4 g total) by mouth 2 (two) times daily.   citalopram 20 MG tablet Commonly known as:  CELEXA Take 1 tablet (20 mg total) by mouth daily.   clotrimazole-betamethasone cream Commonly known as:  LOTRISONE Apply 1 application topically 2 (two) times daily. Apply to perianal skin twice daily.   dexamethasone 2 MG tablet Commonly known as:  DECADRON Take 1-2  tablets (2-4 mg total) by mouth 2 (two) times daily with a meal. 2 tablets ('4mg'$ ) 2 times daily x 3 days, then 2 tablets ('4mg'$ ) daily x 3 days, then 1 tablet ('2mg'$ ) daily x 3 days then stop.   diphenoxylate-atropine 2.5-0.025 MG tablet Commonly known as:  LOMOTIL Take 1-2 tablets by mouth 4 (four) times daily as needed for diarrhea or loose stools.   enoxaparin 60 MG/0.6ML injection Commonly known as:  LOVENOX Inject 0.6 mLs (60 mg total) into the skin every 12 (twelve) hours.   furosemide 20 MG tablet Commonly known as:  LASIX Take 1 tablet (20 mg total) by mouth  2 (two) times daily.   lidocaine-prilocaine cream Commonly known as:  EMLA Apply small amount over port 1-2 hours prior to treatment, cover with plastic wrap (DO NOT RUB IN).   liver oil-zinc oxide 40 % ointment Commonly known as:  DESITIN Apply topically daily.   LORazepam 1 MG tablet Commonly known as:  ATIVAN Take 1 tablet (1 mg total) by mouth every 8 (eight) hours as needed for anxiety (or nausea).   magnesium oxide 400 (241.3 Mg) MG tablet Commonly known as:  MAG-OX Take 1 tablet (400 mg total) by mouth 2 (two) times daily.   mesalamine 1.2 g EC tablet Commonly known as:  LIALDA Take 2 tablets (2.4 g total) by mouth daily with breakfast.   omeprazole 20 MG capsule Commonly known as:  PRILOSEC Take 20 mg by mouth daily.   ondansetron 8 MG tablet Commonly known as:  ZOFRAN Take 1 tablet (8 mg total) by mouth every 8 (eight) hours as needed for nausea or vomiting.   oxyCODONE-acetaminophen 5-325 MG tablet Commonly known as:  PERCOCET/ROXICET Take 1 tablet by mouth every 4 (four) hours as needed for severe pain.   potassium chloride SA 20 MEQ tablet Commonly known as:  K-DUR,KLOR-CON Take 1 tablet (20 mEq total) by mouth daily. While on lasix   PRESCRIPTION MEDICATION Antibody Plan [facility]   saccharomyces boulardii 250 MG capsule Commonly known as:  FLORASTOR Take 250 mg by mouth 2 (two) times daily.     valsartan 80 MG tablet Commonly known as:  DIOVAN Take 80 mg by mouth daily.   VITAMIN D (ERGOCALCIFEROL) PO Give 0.43m (4,000 units) by mouth once daily   zolpidem 12.5 MG CR tablet Commonly known as:  AMBIEN CR Take 1 tablet (12.5 mg total) by mouth at bedtime as needed for sleep.       Review of Systems  Constitutional: Negative for activity change, appetite change, fatigue and unexpected weight change.  HENT: Negative for congestion and hearing loss.   Eyes: Negative.   Respiratory: Negative for cough and shortness of breath.   Cardiovascular: Negative for chest pain, palpitations and leg swelling.  Gastrointestinal: Positive for diarrhea. Negative for abdominal pain and constipation.  Genitourinary: Negative for difficulty urinating and dysuria.  Musculoskeletal: Negative for arthralgias and myalgias.  Skin: Negative for color change and wound.  Neurological: Negative for dizziness and weakness.  Psychiatric/Behavioral: Negative for agitation, behavioral problems and confusion.    Vitals:   09/16/16 1042  BP: 128/70  Pulse: 68  Resp: 18  Temp: 97.1 F (36.2 C)  Weight: 145 lb (65.8 kg)  Height: '5\' 8"'$  (1.727 m)   Body mass index is 22.05 kg/m. Physical Exam  Constitutional: She is oriented to person, place, and time. She appears well-developed and well-nourished. No distress.  HENT:  Head: Normocephalic and atraumatic.  Mouth/Throat: Oropharynx is clear and moist. No oropharyngeal exudate.  Eyes: Conjunctivae are normal. Pupils are equal, round, and reactive to light.  Neck: Normal range of motion. Neck supple.  Cardiovascular: Normal rate, regular rhythm and normal heart sounds.   Pulmonary/Chest: Effort normal and breath sounds normal.  Abdominal: Soft. Bowel sounds are normal.  Musculoskeletal: She exhibits no edema or tenderness.  Neurological: She is alert and oriented to person, place, and time.  Skin: Skin is warm and dry. She is not diaphoretic.   Psychiatric: She has a normal mood and affect.    Labs reviewed: Basic Metabolic Panel:  Recent Labs  09/11/16 0500 09/12/16 0730 09/13/16 09/13/16 0420  NA 137 135 136* 136  K 3.2* 3.4*  --  3.6  CL 104 100*  --  99*  CO2 28 30  --  30  GLUCOSE 112* 111*  --  107*  BUN '8 6 7 7  '$ CREATININE 0.56 0.49 0.6 0.55  CALCIUM 5.9* 5.9*  --  6.2*  MG 1.6* 1.6*  --  1.6*  PHOS 1.4* 1.8*  --  2.7   Liver Function Tests:  Recent Labs  09/05/16 0435 09/08/16 0335 09/10/16 0415  AST 67* 51* 36  ALT 77* 42 31  ALKPHOS 64 79 84  BILITOT 0.5 1.0 0.7  PROT 5.8* 4.3* 4.8*  ALBUMIN 2.1* 1.6* 1.8*    Recent Labs  09/01/16 1808 09/05/16 0435  LIPASE 26 22   No results for input(s): AMMONIA in the last 8760 hours. CBC:  Recent Labs  08/23/16 1309  09/02/16 0415  09/09/16 0400 09/10/16 0415 09/12/16 09/12/16 0730  WBC 8.6  < > 11.4*  < > 1.4* 1.6* 2.1 2.1*  NEUTROABS 7.4*  --  8.8*  --   --  0.7*  --   --   HGB 12.5  < > 10.7*  < > 9.6* 8.8*  --  8.2*  HCT 38.3  < > 32.2*  < > 27.8* 26.0*  --  24.8*  MCV 87.0  < > 84.3  < > 79.7 81.3  --  81.6  PLT 204  < > 185  < > 229 240  --  245  < > = values in this interval not displayed. Cardiac Enzymes:  Recent Labs  09/01/16 2235 09/02/16 0415 09/02/16 1030  TROPONINI <0.03 <0.03 <0.03   BNP: Invalid input(s): POCBNP CBG: No results for input(s): GLUCAP in the last 8760 hours.  Procedures and Imaging Studies During Stay: Ct Abdomen Pelvis Wo Contrast  Result Date: 09/04/2016 CLINICAL DATA:  Metastatic non-small-cell lung cancer under observation. History of Barrett's esophagus, anemia and colitis. History of esophageal adenocarcinoma. EXAM: CT ABDOMEN AND PELVIS WITHOUT CONTRAST TECHNIQUE: Multidetector CT imaging of the abdomen and pelvis was performed following the standard protocol without IV contrast. COMPARISON:  CT 06/24/2016 and 04/19/2016. FINDINGS: Lower chest: New patchy dependent airspace opacities in both  lower lobes. There is no significant pleural or pericardial effusion. Hepatobiliary: There is decreased hepatic density consistent with steatosis. Multiple low-density hepatic lesions are grossly stable, likely cysts. No new or enlarging lesions are identified. The gallbladder is distended without wall thickening. No evidence of gallstones, surrounding inflammation or biliary dilatation. Pancreas: Unremarkable. No pancreatic ductal dilatation or surrounding inflammatory changes. Spleen: Normal in size without focal abnormality. Adrenals/Urinary Tract: Both adrenal glands appear normal. Both kidneys appear normal. There is no evidence of renal mass, hydronephrosis or urinary tract calculus. Left ovarian vein phleboliths are grossly stable. There is high-density material within the urinary bladder. No focal urothelial lesions are seen. Stomach/Bowel: The stomach is decompressed. There is fluid throughout the small bowel and colon. No bowel wall thickening or surrounding inflammatory change identified. The appendix appears normal. There are mild diverticular changes of the sigmoid colon. Rectal tube in place. Vascular/Lymphatic: There are no enlarged abdominal or pelvic lymph nodes. Aortic and branch vessel atherosclerosis. Reproductive: Hysterectomy.  No evidence of adnexal mass. Other: No ascites, peritoneal nodularity or free air. There is soft tissue stranding and subcutaneous air within the subcutaneous fat of the left anterior abdominal wall, presumably from subcutaneous injections. Musculoskeletal: Grossly stable destructive mass involving the left iliac bone with associated pathologic  fracture. No new osseous lesions or new fractures are seen. IMPRESSION: 1. Grossly stable metastasis involving the left iliac bone with associated pathologic fracture. 2. No other evidence of metastatic disease within the abdomen or pelvis. 3. New patchy airspace opacities at both lung bases may reflect atelectasis or aspiration.  Correlate clinically. 4. Fluid-filled small and large bowel without significant distention or wall thickening. Mild sigmoid diverticulosis. Electronically Signed   By: Richardean Sale M.D.   On: 09/04/2016 17:34   Dg Chest 2 View  Result Date: 09/01/2016 CLINICAL DATA:  Left lower chest abdominal pain beginning today. Dyspnea. Metastatic lung carcinoma. EXAM: CHEST  2 VIEW COMPARISON:  10/03/2014 FINDINGS: The heart size and mediastinal contours remain within normal limits. Aortic atherosclerosis. Right-sided power port remains in appropriate position. New airspace opacity is seen in the left lower lobe, suspicious for pneumonia. There is new linear opacity in the right lower lung, suspicious for atelectasis or scarring. No evidence of pneumothorax or pleural effusion. IMPRESSION: New airspace opacity in left lower lobe, suspicious for pneumonia. New right lower lung atelectasis versus scarring. Electronically Signed   By: Earle Gell M.D.   On: 09/01/2016 17:53   Ct Head Wo Contrast  Result Date: 09/07/2016 CLINICAL DATA:  Altered mental status, history of esophageal cancer, rule out metastasis EXAM: CT HEAD WITHOUT CONTRAST TECHNIQUE: Contiguous axial images were obtained from the base of the skull through the vertex without intravenous contrast. COMPARISON:  07/20/2015 CT FINDINGS: Brain: Sulcal prominence over the convexity of the brain consistent with superficial atrophy. No significant central atrophy. No focus of hemorrhage, midline shift or edema. Ventricles are not dilated. No acute infarction. Lack of IV contrast limits assessment for metastatic disease. Vascular: Minimal atherosclerosis of the cavernous internal carotids. Skull: No lytic or blastic disease.  No acute osseous abnormality. Sinuses/Orbits: Bilateral ocular lens surgical change. Mucosal thickening in the sphenoid and posterior ethmoid sinus. No mastoid effusion. Other: None IMPRESSION: Superficial atrophy. No acute intracranial  abnormality. No findings of metastatic disease to the brain however lack of IV contrast limits assessment. Electronically Signed   By: Ashley Royalty M.D.   On: 09/07/2016 13:48   Ct Angio Chest Pe W Or Wo Contrast  Result Date: 09/02/2016 CLINICAL DATA:  Pt with metastatic non-small cell lung cancer under observation, hypertension, Barrett's esophagus, anemia and colitis secondary to Nivolumab with persistent diarrhea, presents to the ER because of chest pain. EXAM: CT ANGIOGRAPHY CHEST WITH CONTRAST TECHNIQUE: Multidetector CT imaging of the chest was performed using the standard protocol during bolus administration of intravenous contrast. Multiplanar CT image reconstructions and MIPs were obtained to evaluate the vascular anatomy. CONTRAST:  100 mL Isovue 370 COMPARISON:  CT chest 06/24/2016, 04/19/2016, 01/14/2016 FINDINGS: Cardiovascular: Satisfactory opacification of the pulmonary arteries to the segmental level. Occlusive pulmonary embolus in the left lower lobe pulmonary artery. Pulmonary embolus in the right lower lobe segmental pulmonary artery. Normal heart size. No pericardial effusion. Thoracic aortic atherosclerosis. Mediastinum/Nodes: No enlarged mediastinal, hilar, or axillary lymph nodes. Thyroid gland, trachea, and esophagus demonstrate no significant findings. Lungs/Pleura: Small left pleural effusion. Right basilar airspace disease which may reflect atelectasis versus pneumonia versus pulmonary infarction. Left basilar disease which may reflect atelectasis versus pneumonia versus less likely developing pulmonary infarction. Bilateral centrilobular emphysema. Upper Abdomen: No acute upper abdominal abnormality. Small hiatal hernia. Musculoskeletal: No acute osseous abnormality. No aggressive lytic or sclerotic osseous lesion Review of the MIP images confirms the above findings. IMPRESSION: 1. Acute bilateral pulmonary emboli as described  above. Positive for acute PE with CT evidence of right  heart strain (RV/LV Ratio = 1.07) consistent with at least submassive (intermediate risk) PE. The presence of right heart strain has been associated with an increased risk of morbidity and mortality. Please activate Code PE by paging [phone]. 2. Right basilar airspace disease which may reflect atelectasis versus pneumonia versus pulmonary infarction. Left basilar disease which may reflect atelectasis versus pneumonia versus less likely developing pulmonary infarction. 3.  Aortic Atherosclerosis (ICD10-170.0) 4.  Emphysema. (LFY10-F75.9) Critical Value/emergent results were called by telephone at the time of interpretation on 09/02/2016 at 10:06 am to Dr. Karleen Hampshire, who verbally acknowledged these results. Electronically Signed   By: Kathreen Devoid   On: 09/02/2016 10:07    Assessment/Plan:   1. Diarrhea, unspecified type -ongoing, will need follow up by GI as outpatient. Will cont current regimen at this time.  - budesonide (ENTOCORT EC) 3 MG 24 hr capsule; Take 3 capsules (9 mg total) by mouth daily.  Dispense: 90 capsule; Refill: 0 - cholestyramine (QUESTRAN) 4 g packet; Take 1 packet (4 g total) by mouth 2 (two) times daily.  Dispense: 60 each; Refill: 0 - magnesium oxide (MAG-OX) 400 (241.3 Mg) MG tablet; Take 1 tablet (400 mg total) by mouth 2 (two) times daily.  Dispense: 60 tablet; Refill: 0 - mesalamine (LIALDA) 1.2 g EC tablet; Take 2 tablets (2.4 g total) by mouth daily with breakfast.  Dispense: 30 tablet; Refill: 0 - saccharomyces boulardii (FLORASTOR) 250 MG capsule; Take 1 capsule (250 mg total) by mouth 2 (two) times daily.  Dispense: 60 capsule; Refill: 0  2. Metastatic lung cancer (metastasis from lung to other site), unspecified laterality ([place]) Stable. Following with oncology, pt is without shortness of breath or chest pains at this time.  - dexamethasone (DECADRON) 2 MG tablet; Take 2 tablets ('4mg'$ ) daily x 3 days Stop Date: 09/18/16, then 1 tablet ('2mg'$ ) daily x 3 days then stop. Stop  date: 09/21/16  Dispense: 10 tablet; Refill: 0  3. Hypokalemia -will need BMP to follow up by PCP - potassium chloride SA (K-DUR,KLOR-CON) 20 MEQ tablet; Take 1 tablet (20 mEq total) by mouth daily. While on lasix  Dispense: 10 tablet; Refill: 0  4. Bilateral pulmonary embolism ([facility]) - enoxaparin (LOVENOX) 60 MG/0.6ML injection; Inject 0.6 mLs (60 mg total) into the skin every 12 (twelve) hours.  Dispense: 60 Syringe; Refill: 0  5. Essential hypertension Stable - amLODipine (NORVASC) 5 MG tablet; Take 1 tablet (5 mg total) by mouth daily.  Dispense: 30 tablet; Refill: 0 - valsartan (DIOVAN) 80 MG tablet; Take 1 tablet (80 mg total) by mouth daily.  Dispense: 30 tablet; Refill: 0  6. Nausea without vomiting - ondansetron (ZOFRAN) 8 MG tablet; Take 1 tablet (8 mg total) by mouth every 8 (eight) hours as needed for nausea or vomiting.  Dispense: 90 tablet; Refill: 0  Patient is being discharged with the following home health services:  PT/OT/Nursing  Patient is being discharged with the following durable medical equipment:  None, reports they have all equipment needed at home  Patient has been advised to f/u with their PCP in 1 week to bring them up to date on their rehab stay.  Social services at facility was responsible for arranging this appointment however pt is adament about leaving today and doctors office is closed. Discussed importance of this appt to follow up electrolytes and pt and family members aware and will set up appt with PCP next week.[name]  Pt  was provided with a 30 day supply of prescriptions for medications and refills must be obtained from their PCP.  For controlled substances will be given by provider that has been previously prescribing medication.

## 2016-09-20 ENCOUNTER — Ambulatory Visit: Payer: Medicare Other

## 2016-09-27 ENCOUNTER — Ambulatory Visit (HOSPITAL_COMMUNITY): Payer: Medicare Other

## 2016-09-29 ENCOUNTER — Other Ambulatory Visit: Payer: Self-pay | Admitting: Hematology

## 2016-09-29 ENCOUNTER — Ambulatory Visit: Payer: Medicare Other | Admitting: Physician Assistant

## 2016-09-29 DIAGNOSIS — E860 Dehydration: Secondary | ICD-10-CM

## 2016-09-29 DIAGNOSIS — E876 Hypokalemia: Secondary | ICD-10-CM

## 2016-10-03 ENCOUNTER — Encounter (HOSPITAL_COMMUNITY): Payer: Self-pay

## 2016-10-03 ENCOUNTER — Ambulatory Visit (HOSPITAL_COMMUNITY)
Admission: RE | Admit: 2016-10-03 | Discharge: 2016-10-03 | Disposition: A | Discharge status: Home / Self Care | Payer: Medicare Other | Source: Ambulatory Visit | Attending: Hematology | Admitting: Hematology

## 2016-10-03 DIAGNOSIS — E860 Dehydration: Secondary | ICD-10-CM | POA: Diagnosis present

## 2016-10-03 DIAGNOSIS — E876 Hypokalemia: Secondary | ICD-10-CM | POA: Insufficient documentation

## 2016-10-03 DIAGNOSIS — I700 Atherosclerosis of aorta: Secondary | ICD-10-CM | POA: Diagnosis not present

## 2016-10-03 MED ORDER — IOPAMIDOL (ISOVUE-300) INJECTION 61%
100.0000 mL | Freq: Once | INTRAVENOUS | Status: AC | PRN
  Administered 2016-10-03: 100 mL via INTRAVENOUS

## 2016-10-04 ENCOUNTER — Ambulatory Visit (HOSPITAL_BASED_OUTPATIENT_CLINIC_OR_DEPARTMENT_OTHER): Payer: Medicare Other | Admitting: Hematology

## 2016-10-04 ENCOUNTER — Encounter: Payer: Self-pay | Admitting: Hematology

## 2016-10-04 ENCOUNTER — Ambulatory Visit (HOSPITAL_BASED_OUTPATIENT_CLINIC_OR_DEPARTMENT_OTHER): Payer: Medicare Other

## 2016-10-04 ENCOUNTER — Other Ambulatory Visit (HOSPITAL_BASED_OUTPATIENT_CLINIC_OR_DEPARTMENT_OTHER): Payer: Medicare Other

## 2016-10-04 ENCOUNTER — Ambulatory Visit: Payer: Medicare Other

## 2016-10-04 VITALS — BP 95/45 | HR 94 | Temp 97.5°F | Resp 16 | Ht 68.0 in | Wt 123.9 lb

## 2016-10-04 DIAGNOSIS — C3491 Malignant neoplasm of unspecified part of right bronchus or lung: Secondary | ICD-10-CM

## 2016-10-04 DIAGNOSIS — Z95828 Presence of other vascular implants and grafts: Secondary | ICD-10-CM

## 2016-10-04 DIAGNOSIS — E876 Hypokalemia: Secondary | ICD-10-CM

## 2016-10-04 DIAGNOSIS — C3490 Malignant neoplasm of unspecified part of unspecified bronchus or lung: Secondary | ICD-10-CM

## 2016-10-04 DIAGNOSIS — K521 Toxic gastroenteritis and colitis: Secondary | ICD-10-CM

## 2016-10-04 DIAGNOSIS — E860 Dehydration: Secondary | ICD-10-CM

## 2016-10-04 DIAGNOSIS — R197 Diarrhea, unspecified: Secondary | ICD-10-CM

## 2016-10-04 DIAGNOSIS — I10 Essential (primary) hypertension: Secondary | ICD-10-CM

## 2016-10-04 LAB — CBC & DIFF AND RETIC
BASO%: 0.2 % (ref 0.0–2.0)
BASOS ABS: 0 10*3/uL (ref 0.0–0.1)
EOS ABS: 0 10*3/uL (ref 0.0–0.5)
EOS%: 0.2 % (ref 0.0–7.0)
HEMATOCRIT: 31.2 % — AB (ref 34.8–46.6)
HEMOGLOBIN: 10 g/dL — AB (ref 11.6–15.9)
IMMATURE RETIC FRACT: 14.6 % — AB (ref 1.60–10.00)
LYMPH#: 1.2 10*3/uL (ref 0.9–3.3)
LYMPH%: 10.8 % — ABNORMAL LOW (ref 14.0–49.7)
MCH: 26.5 pg (ref 25.1–34.0)
MCHC: 32 g/dL (ref 31.5–36.0)
MCV: 83 fL (ref 79.5–101.0)
MONO#: 0.8 10*3/uL (ref 0.1–0.9)
MONO%: 7.1 % (ref 0.0–14.0)
NEUT#: 8.7 10*3/uL — ABNORMAL HIGH (ref 1.5–6.5)
NEUT%: 81.7 % — AB (ref 38.4–76.8)
NRBC: 0 % (ref 0–0)
Platelets: 357 10*3/uL (ref 145–400)
RBC: 3.76 10*6/uL (ref 3.70–5.45)
RDW: 17.8 % — AB (ref 11.2–14.5)
RETIC %: 1.93 % (ref 0.70–2.10)
RETIC CT ABS: 72.57 10*3/uL (ref 33.70–90.70)
WBC: 10.7 10*3/uL — ABNORMAL HIGH (ref 3.9–10.3)

## 2016-10-04 LAB — COMPREHENSIVE METABOLIC PANEL
ALK PHOS: 102 U/L (ref 40–150)
ALT: 16 U/L (ref 0–55)
AST: 22 U/L (ref 5–34)
Anion Gap: 9 mEq/L (ref 3–11)
BUN: 13.1 mg/dL (ref 7.0–26.0)
CALCIUM: 6.7 mg/dL — AB (ref 8.4–10.4)
CO2: 17 mEq/L — ABNORMAL LOW (ref 22–29)
CREATININE: 1.1 mg/dL (ref 0.6–1.1)
Chloride: 111 mEq/L — ABNORMAL HIGH (ref 98–109)
EGFR: 51 mL/min/{1.73_m2} — ABNORMAL LOW (ref >90)
GLUCOSE: 124 mg/dL (ref 70–140)
POTASSIUM: 2.8 meq/L — AB (ref 3.5–5.1)
SODIUM: 137 meq/L (ref 136–145)
Total Bilirubin: 0.35 mg/dL (ref 0.20–1.20)
Total Protein: 6.1 g/dL — ABNORMAL LOW (ref 6.4–8.3)

## 2016-10-04 MED ORDER — SODIUM CHLORIDE 0.9 % IV SOLN: INTRAVENOUS | Status: DC

## 2016-10-04 MED ORDER — VALSARTAN 80 MG PO TABS: 40.0000 mg | ORAL_TABLET | Freq: Every day | ORAL | 0 refills | Status: DC

## 2016-10-04 MED ORDER — HEPARIN SOD (PORK) LOCK FLUSH 100 UNIT/ML IV SOLN
500.0000 [IU] | INTRAVENOUS | Status: AC | PRN
  Administered 2016-10-04: 500 [IU]
  Filled 2016-10-04: qty 5

## 2016-10-04 MED ORDER — POTASSIUM CHLORIDE CRYS ER 20 MEQ PO TBCR: EXTENDED_RELEASE_TABLET | ORAL | 0 refills | Status: DC

## 2016-10-04 MED ORDER — SODIUM CHLORIDE 0.9 % IJ SOLN
10.0000 mL | INTRAMUSCULAR | Status: AC | PRN
  Administered 2016-10-04: 10 mL
  Filled 2016-10-04: qty 10

## 2016-10-05 ENCOUNTER — Ambulatory Visit (HOSPITAL_BASED_OUTPATIENT_CLINIC_OR_DEPARTMENT_OTHER): Payer: Medicare Other | Admitting: Nurse Practitioner

## 2016-10-05 ENCOUNTER — Other Ambulatory Visit: Payer: Self-pay | Admitting: Nurse Practitioner

## 2016-10-05 ENCOUNTER — Other Ambulatory Visit (HOSPITAL_COMMUNITY): Payer: Self-pay | Admitting: *Deleted

## 2016-10-05 ENCOUNTER — Other Ambulatory Visit: Payer: Self-pay | Admitting: Pharmacist

## 2016-10-05 ENCOUNTER — Telehealth: Payer: Self-pay | Admitting: *Deleted

## 2016-10-05 VITALS — BP 98/47 | HR 98 | Temp 97.4°F | Resp 18 | Ht 68.0 in | Wt 123.4 lb

## 2016-10-05 DIAGNOSIS — R197 Diarrhea, unspecified: Secondary | ICD-10-CM

## 2016-10-05 DIAGNOSIS — E876 Hypokalemia: Secondary | ICD-10-CM | POA: Diagnosis not present

## 2016-10-05 DIAGNOSIS — Z95828 Presence of other vascular implants and grafts: Secondary | ICD-10-CM

## 2016-10-05 DIAGNOSIS — C3490 Malignant neoplasm of unspecified part of unspecified bronchus or lung: Secondary | ICD-10-CM | POA: Diagnosis not present

## 2016-10-05 MED ORDER — OCTREOTIDE ACETATE 30 MG IM KIT
30.0000 mg | PACK | Freq: Once | INTRAMUSCULAR | Status: AC
  Administered 2016-10-05: 30 mg via INTRAMUSCULAR
  Filled 2016-10-05: qty 1

## 2016-10-05 MED ORDER — SODIUM CHLORIDE 0.9 % IV SOLN
Freq: Once | INTRAVENOUS | Status: AC
  Administered 2016-10-05: 14:00:00 via INTRAVENOUS
  Filled 2016-10-05: qty 1000

## 2016-10-05 MED ORDER — HEPARIN SOD (PORK) LOCK FLUSH 100 UNIT/ML IV SOLN
500.0000 [IU] | INTRAVENOUS | Status: AC | PRN
  Administered 2016-10-05: 500 [IU]
  Filled 2016-10-05: qty 5

## 2016-10-05 MED ORDER — SODIUM CHLORIDE 0.9 % IJ SOLN
10.0000 mL | INTRAMUSCULAR | Status: AC | PRN
  Administered 2016-10-05: 10 mL
  Filled 2016-10-05: qty 10

## 2016-10-05 NOTE — Progress Notes (Signed)
1715 pt escorted in wheelchair with husband to infusion room to complete treatment. Report given to Myrtie Neither RN.

## 2016-10-05 NOTE — Patient Instructions (Addendum)
Call tomorrow or Friday for appt for lab and to see Dr Irene Limbo about 10/18/16    Hypokalemia Hypokalemia means that the amount of potassium in the blood is lower than normal.Potassium is a chemical, called an electrolyte, that helps regulate the amount of fluid in the body. It also stimulates muscle contraction and helps nerves function properly.Most of the body's potassium is inside of cells, and only a very small amount is in the blood. Because the amount in the blood is so small, minor changes can be life-threatening. CAUSES  Antibiotics.  Diarrhea or vomiting.  Using laxatives too much, which can cause diarrhea.  Chronic kidney disease.  Water pills (diuretics).  Eating disorders (bulimia).  Low magnesium level.  Sweating a lot. SIGNS AND SYMPTOMS  Weakness.  Constipation.  Fatigue.  Muscle cramps.  Mental confusion.  Skipped heartbeats or irregular heartbeat (palpitations).  Tingling or numbness. DIAGNOSIS  Your health care provider can diagnose hypokalemia with blood tests. In addition to checking your potassium level, your health care provider may also check other lab tests. TREATMENT Hypokalemia can be treated with potassium supplements taken by mouth or adjustments in your current medicines. If your potassium level is very low, you may need to get potassium through a vein (IV) and be monitored in the hospital. A diet high in potassium is also helpful. Foods high in potassium are:  Nuts, such as peanuts and pistachios.  Seeds, such as sunflower seeds and pumpkin seeds.  Peas, lentils, and lima beans.  Whole grain and bran cereals and breads.  Fresh fruit and vegetables, such as apricots, avocado, bananas, cantaloupe, kiwi, oranges, tomatoes, asparagus, and potatoes.  Orange and tomato juices.  Red meats.  Fruit yogurt. HOME CARE INSTRUCTIONS  Take all medicines as prescribed by your health care provider.  Maintain a healthy diet by including  nutritious food, such as fruits, vegetables, nuts, whole grains, and lean meats.  If you are taking a laxative, be sure to follow the directions on the label. SEEK MEDICAL CARE IF:  Your weakness gets worse.  You feel your heart pounding or racing.  You are vomiting or having diarrhea.  You are diabetic and having trouble keeping your blood glucose in the normal range. SEEK IMMEDIATE MEDICAL CARE IF:  You have chest pain, shortness of breath, or dizziness.  You are vomiting or having diarrhea for more than 2 days.  You faint. MAKE SURE YOU:   Understand these instructions.  Will watch your condition.  Will get help right away if you are not doing well or get worse.   This information is not intended to replace advice given to you by your health care provider. Make sure you discuss any questions you have with your health care provider.   Document Released: 11/21/2005 Document Revised: 12/12/2014 Document Reviewed: 05/24/2013 Elsevier Interactive Patient Education 2016 [employer].  Hypomagnesemia Hypomagnesemia is a condition in which the level of magnesium in the blood is low. Magnesium is a mineral that is found in many foods. It is used in many different processes in the body. Hypomagnesemia can affect every organ in the body. It can cause life-threatening problems. CAUSES Causes of hypomagnesemia include:  Not getting enough magnesium in your diet.  Malnutrition.  Problems with absorbing magnesium from the intestines.  Dehydration.  Alcohol abuse.  Vomiting.  Severe diarrhea.  Some medicines, including medicines that make you urinate more.  Certain diseases, such as kidney disease, diabetes, and overactive thyroid. SIGNS AND SYMPTOMS  Involuntary shaking or  trembling of a body part (tremor).  Confusion.  Muscle weakness.  Sensitivity to light, sound, and touch.  Psychiatric issues, such as depression, irritability, or psychosis.  Sudden  tightening of muscles (muscle spasms).  Tingling in the arms and legs.  A feeling of fluttering of the heart. These symptoms are more severe if magnesium levels drop suddenly. DIAGNOSIS To make a diagnosis, your health care provider will do a physical exam and order blood and urine tests. TREATMENT Treatment will depend on the cause and the severity of your condition. It may involve:  A magnesium supplement. This can be taken in pill form. It can also be given through an IV tube. This is usually done if the condition is severe.  Changes to your diet. You may be directed to eat foods that have a lot of magnesium, such as green leafy vegetables, peas, beans, and nuts.  Eliminating alcohol from your diet. HOME CARE INSTRUCTIONS  Include foods with magnesium in your diet. Foods that are rich in magnesium include green vegetables, beans, nuts and seeds, and whole grains.  Take medicines only as directed by your health care provider.  Take magnesium supplements if your health care provider instructs you to do that. Take them as directed.  Have your magnesium levels monitored as directed by your health care provider.  When you are active, drink fluids that contain electrolytes.  Keep all follow-up visits as directed by your health care provider. This is important. SEEK MEDICAL CARE IF:  You get worse instead of better.  Your symptoms return. SEEK IMMEDIATE MEDICAL CARE IF:  Your symptoms are severe.   This information is not intended to replace advice given to you by your health care provider. Make sure you discuss any questions you have with your health care provider.   Document Released: 08/17/2005 Document Revised: 12/12/2014 Document Reviewed: 07/07/2014 Elsevier Interactive Patient Education [employer].

## 2016-10-05 NOTE — Telephone Encounter (Signed)
Per message. No prior off needed for sandostatin.

## 2016-10-05 NOTE — Telephone Encounter (Signed)
-----   Message from Gaspar Bidding sent at 10/05/2016 11:18 AM EDT ----- Regarding: RE: Prior auth npr ----- Message ----- From: Ronnette Juniper, RN Sent: 10/05/2016  10:43 AM To: Adalberto Cole, RN, Gaspar Bidding Subject: Prior Verdon Cummins question. MD Irene Limbo has an order for this patient to get Sandostatin today in the infusion center. This patient has [employer] Do you know if this needs to be prior auth? Who do I contact about this?  Thank you.

## 2016-10-07 ENCOUNTER — Telehealth: Payer: Self-pay | Admitting: *Deleted

## 2016-10-07 NOTE — Telephone Encounter (Signed)
Addendum to previous note:  Per Morey Hummingbird with [facility], pt advised to go to ED due to increased diarrhea and emesis & pt refused.  Morey Hummingbird spoke with Darden Dates, RN via Dr. Irene Limbo, it was decided that hospice home care would be an option.  Morey Hummingbird spoke with patient regarding hospice, pt stated she would like referral.  This RN made referral to [facility] with Katharine Look of hospice.

## 2016-10-07 NOTE — Telephone Encounter (Signed)
Spoke with Morey Hummingbird from [facility].  Per Morey Hummingbird, pt having increased emesis and diarrhea at home.  Pt advised to go to ED, but refused.  Morey Hummingbird spoke with p

## 2016-10-09 NOTE — Progress Notes (Signed)
[name]    HEMATOLOGY ONCOLOGY PROGRESS NOTE  Date of service: .10/04/2016  Patient Care Team: Tamsen Roers, MD as PCP - General (Family Medicine) [name], MD as Consulting Physician (Hematology and Oncology)  CHIEF COMPLAINTS/PURPOSE OF CONSULTATION: Follow-up for metastatic lung cancer  DIAGNOSIS:   #1 Metastatic non-small cell lung cancer with bilateral lung nodules and large metastatic lesion in the left Ilium. #2 Barrett's esophagus with some evidence of intramucosal adenocarcinoma of the esophagus. 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  Previous Treatment  1 Palliative radiation therapy to the large left ilium metastases 2. IV Nivolumab x 20ycles (discontinued due to likely immune colitis) 3. Xgeva 1251mSC q4weeks for bone metastases.   HISTORY OF PRESENTING ILLNESS: (plz see my previous consultation for details of initial presentation)  INTERVAL HISTORY  Ms ToBerts here for her scheduled followup. She notes that she is still having somewhat bothersome diarrhea about 4-5 times daily despite being on Lialda, budesonide and loperamide. Notes that she does not have a GI follow-up and was encouraged to get going as soon as possible for further optimization of treatment. Discussed pros and cons of considering long-acting octreotide to see if this might help her diarrhea. She had a CT of the chest abdomen pelvis - shows a new left ventral chest wall lesion which could be metastatic disease versus a hematoma given that she has extensive bruising over her left breast during her recent hospitalization. She also has some posterior lower lobe nodular densities which may reflect areas of pulmonary metastases. Interval decrease in size of destructive lesion involving the left iliac bone. Labs  show the patient is hypokalemic and dehydrated and she was set up for IV fluids and potassium replacement and her oral potassium replacement was increased. Her Xgeva dose was held in the setting of hypocalcemia.  MEDICAL HISTORY:  Past Medical History:  Diagnosis Date  . Barrett's esophagus   . Bone neoplasm 06/24/2015  [facility]   metastatic poorly differentiated carcinoma. tumor left groin surgical removal with radiation tx.  . Cataract    BILATERAL  . Cigarette smoker two packs a day or less    Currently still smoking 2 PPD - Not interested in quitting at this time.  . Colitis 2017  . Colon polyps    hyperplastic, tubular adenomas, tubulovillous adenoma  . Cough, persistent    hx. lung cancer ? primary-being evaluated, unsure of primary site.  . Depression 06/24/2015  . Diverticulosis   . [place] [facility] [place]  . Endometriosis    Hysterectomy with BSO at age [age]rs  . [place] [place]   intramucosal  . Gastritis   . GERD (gastroesophageal reflux disease)   . H/O: pneumonia   . Hiatal hernia   . Hyperlipidemia   . Hypertension 06/24/2015   likely improved incidental to 40 lbs weight loss from her neoplasm. No Longer taking med for this as of 08-06-15  . IBS (irritable bowel syndrome)   . Pain    left hip-persistent"tumor of bone"-radiation tx. 10.  . Vitamin D deficiency disease    SURGICAL HISTORY: Past Surgical History:  Procedure Laterality Date  . BARTHOLIN GLAND CYST EXCISION  5215o ago   Does not want if it was an infected cyst or tumor. Was soon as delivery  .  COLONOSCOPY W/ POLYPECTOMY     multiple times - last done 09/2014 per patient.  . ESOPHAGOGASTRODUODENOSCOPY (EGD) WITH PROPOFOL N/A 08/11/2015   Procedure: ESOPHAGOGASTRODUODENOSCOPY (EGD) WITH PROPOFOL;  Surgeon: [name], MD;  Location: [facility];  Service: Gastroenterology;  Laterality: N/A;  . GANGLION CYST EXCISION    . KNEE ARTHROSCOPY  age about 46 yrs  .  TONSILLECTOMY    . TOTAL ABDOMINAL HYSTERECTOMY W/ BILATERAL SALPINGOOPHORECTOMY  at age 74 yrs   For endometriosis    SOCIAL HISTORY: Social History   Social History  . Marital status: Widowed    Spouse name: N/A  . Number of children: 2  . Years of education: N/A   Occupational History  . Not on file.   Social History Main Topics  . Smoking status: Former Smoker    Packs/day: 1.00    Years: 60.00    Types: Cigarettes    Quit date: 12/06/2015  . Smokeless tobacco: Never Used  . Alcohol use No  . Drug use: No  . Sexual activity: No   Other Topics Concern  . Not on file   Social History Narrative  . No narrative on file    FAMILY HISTORY: Family History  Problem Relation Age of Onset  . Colon cancer Brother   . Colon cancer Brother   . Stroke Mother   . Colon cancer Father   . Breast cancer Daughter 20    ER/PR+ stage II    ALLERGIES:  is allergic to penicillins; remeron [mirtazapine]; and latex. patient wonders if she has a penicillin allergy but notes that she is uncertain about this.  MEDICATIONS:  Current Outpatient Prescriptions  Medication Sig Dispense Refill  . budesonide (ENTOCORT EC) 3 MG 24 hr capsule Take 3 capsules (9 mg total) by mouth daily. 90 capsule 0  . calcium-vitamin D (OSCAL WITH D) 500-200 MG-UNIT tablet Take 2 tablets by mouth 3 (three) times daily.    . cholestyramine (QUESTRAN) 4 g packet Take 1 packet (4 g total) by mouth 2 (two) times daily. 60 each 0  . citalopram (CELEXA) 20 MG tablet Take 1 tablet (20 mg total) by mouth daily. 30 tablet 0  . clotrimazole-betamethasone (LOTRISONE) cream Apply 1 application topically 2 (two) times daily. Apply to perianal skin twice daily. 30 g 1  . diphenoxylate-atropine (LOMOTIL) 2.5-0.025 MG tablet Take 1-2 tablets by mouth 4 (four) times daily as needed for diarrhea or loose stools. 60 tablet 0  . enoxaparin (LOVENOX) 60 MG/0.6ML injection Inject 0.6 mLs (60 mg total) into the skin every 12  (twelve) hours. 60 Syringe 0  . furosemide (LASIX) 20 MG tablet Take 1 tablet (20 mg total) by mouth 2 (two) times daily. 60 tablet 0  . lidocaine-prilocaine (EMLA) cream Apply small amount over port 1-2 hours prior to treatment, cover with plastic wrap (DO NOT RUB IN). 30 g prn  . liver oil-zinc oxide (DESITIN) 40 % ointment Apply topically daily. 56.7 g 0  . LORazepam (ATIVAN) 1 MG tablet Take 1 tablet (1 mg total) by mouth every 8 (eight) hours as needed for anxiety (or nausea). 15 tablet 0  . magnesium oxide (MAG-OX) 400 (241.3 Mg) MG tablet Take 1 tablet (400 mg total) by mouth 2 (two) times daily. 60 tablet 0  . mesalamine (LIALDA) 1.2 g EC tablet Take 2 tablets (2.4 g total) by mouth daily with breakfast. 30 tablet 0  . omeprazole (PRILOSEC) 20 MG capsule Take 1 capsule (20 mg total) by mouth  daily. 30 capsule 0  . ondansetron (ZOFRAN) 8 MG tablet Take 1 tablet (8 mg total) by mouth every 8 (eight) hours as needed for nausea or vomiting. 90 tablet 0  . oxyCODONE-acetaminophen (PERCOCET/ROXICET) 5-325 MG tablet Take 1 tablet by mouth every 4 (four) hours as needed for severe pain. 15 tablet 0  . potassium chloride SA (K-DUR,KLOR-CON) 20 MEQ tablet Take 93mq twice daily for 3 days then 426m once daily 60 tablet 0  . PRESCRIPTION MEDICATION Antibody Plan [facility]    . saccharomyces boulardii (FLORASTOR) 250 MG capsule Take 1 capsule (250 mg total) by mouth 2 (two) times daily. 60 capsule 0  . valsartan (DIOVAN) 80 MG tablet Take 0.5 tablets (40 mg total) by mouth daily. 30 tablet 0  . VITAMIN D, ERGOCALCIFEROL, PO Give 0.32m84m4,000 units) by mouth once daily    . zolpidem (AMBIEN CR) 12.5 MG CR tablet Take 1 tablet (12.5 mg total) by mouth at bedtime as needed for sleep. 30 tablet 0   No current facility-administered medications for this visit.     REVIEW OF SYSTEMS:    10 point review of systems done and was noted to be negative except as noted above.  PHYSICAL EXAMINATION: ECOG  PERFORMANCE STATUS: 2 - Symptomatic, <50% confined to bed  Vitals:   10/04/16 1555  BP: (!) 95/45  Pulse: 94  Resp: 16  Temp: 97.5 F (36.4 C)   Filed Weights   10/04/16 1555  Weight: 123 lb 14.4 oz (56.2 kg)  . Wt Readings from Last 3 Encounters:  10/05/16 123 lb 6.4 oz (56 kg)  10/04/16 123 lb 14.4 oz (56.2 kg)  09/16/16 145 lb (65.8 kg)   GENERAL:alert, NAD, appears well. SKIN: skin color, texture, turgor are normal, no rashes or significant lesions EYES: normal, conjunctiva are pink and non-injected, sclera clear OROPHARYNX:no exudate, no erythema and lips, buccal mucosa, and tongue normal  NECK: supple, thyroid normal size, non-tender, without nodularity LYMPH:  no palpable lymphadenopathy in the cervical, axillary or inguinal LUNGS: clear to auscultation and bilateral reduced breath sounds with normal respiratory effort  HEART: regular rate & rhythm and no murmurs and resolved lower extremity edema ABDOMEN:abdomen soft, non-tender and normal bowel sounds Musculoskeletal: No pedal edema. No calf pain or tenderness.  PSYCH: alert & oriented x 3 with fluent speech NEURO: no focal motor/sensory deficits.  LABORATORY DATA:  I have reviewed the data as listed . CBC Latest Ref Rng & Units 10/04/2016 09/12/2016 09/12/2016  WBC 3.9 - 10.3 10e3/uL 10.7(H) 2.1(L) 2.1  Hemoglobin 11.6 - 15.9 g/dL 10.0(L) 8.2(L) -  Hematocrit 34.8 - 46.6 % 31.2(L) 24.8(L) -  Platelets 145 - 400 10e3/uL 357 245 -   . CMP Latest Ref Rng & Units 10/04/2016 09/13/2016 09/13/2016  Glucose 70 - 140 mg/dl 124 107(H) -  BUN 7.0 - 26.0 mg/dL 13._0 Creatinine 0.6 - 1.1 mg/dL 1.1 0.55 0.6  Sodium 136 - 145 mEq/L 137 136 136(A)  Potassium 3.5 - 5.1 mEq/L 2.8(LL) 3.6 -  Chloride 101 - 111 mmol/L - 99(L) -  CO2 22 - 29 mEq/L 17(L) 30 -  Calcium 8.4 - 10.4 mg/dL 6.7(L) 6.2(LL) -  Total Protein 6.4 - 8.3 g/dL 6.1(L) - -  Total Bilirubin 0.20 - 1.20 mg/dL 0.35 - -  Alkaline Phos 40 - 150 U/L 102 - -    AST 5 - 34 U/L 22 - -  ALT 0 - 55 U/L 16 - -  RADIOGRAPHIC STUDIES: I have personally reviewed the radiological images as listed and agreed with the findings in the report. Ct Chest W Contrast  Result Date: 10/03/2016 CLINICAL DATA:  Restaging lung cancer with bone metastases. EXAM: CT CHEST, ABDOMEN, AND PELVIS WITH CONTRAST TECHNIQUE: Multidetector CT imaging of the chest, abdomen and pelvis was performed following the standard protocol during bolus administration of intravenous contrast. CONTRAST:  16m ISOVUE-300 IOPAMIDOL (ISOVUE-300) INJECTION 61% COMPARISON:  09/02/2016. FINDINGS: CT CHEST FINDINGS Cardiovascular: Normal heart size. Aortic atherosclerosis. Calcification within the LAD coronary artery noted. Mediastinum/Nodes: No mediastinal or hilar adenopathy. Low-attenuation mass within the left chest wall deep to the pectoralis muscle measures 5.2 x 2.1 cm and appears new from previous exam. Lungs/Pleura: No pleural effusion. Moderate changes of centrilobular emphysema identified. There are multiple subpleural nodules within the posterior lower lobes. New from previous exam and suspicious for metastatic disease. Index lesion on the left measures 3.8 x 1.7 cm, image 110 of series 6. Adjacent subpleural nodule in the left lower lobe measures 1.8 x 1.5 cm, image 112 of series 6. Musculoskeletal: No aggressive lytic or sclerotic bone lesions. CT ABDOMEN PELVIS FINDINGS Hepatobiliary: Cysts are identified within the liver. Gallbladder appears normal. No biliary dilatation. Pancreas: No inflammation or mass identified. Spleen: Normal in size without focal abnormality. Adrenals/Urinary Tract: The adrenal glands are normal. Normal appearance of the kidneys. No mass or hydronephrosis. The urinary bladder appears normal. Stomach/Bowel: The stomach is within normal limits. The small bowel loops have a normal course and caliber. No obstruction. Mild diffuse wall thickening involving the colon. No  pneumatosis. No evidence for bowel perforation. Vascular/Lymphatic: Calcified atherosclerotic disease involves the abdominal aorta. No aneurysm. No enlarged retroperitoneal or mesenteric adenopathy. No enlarged pelvic or inguinal lymph nodes. Reproductive: Status post hysterectomy. No adnexal masses. Other: There is no ascites or focal fluid collections within the abdomen or pelvis. Musculoskeletal: Large lytic lesion involving the left iliac bone is again identified. This is decreased in size from previous exam measuring 9.3 x 1.5 cm, image 85 of series 2. Previously 10.8 x 4.0 cm. IMPRESSION: 1. Mixed interval response to therapy. 2. There is a new left ventral chest wall lesion deep to the pectoralis musculature worrisome for metastatic disease. 3. Posterior lower lobe nodular densities are identified which may reflect areas of pulmonary metastasis. 4. Interval decrease in size of destructive lesion involving the left iliac bone. 5. Aortic atherosclerosis. Electronically Signed   By: TKerby MoorsM.D.   On: 10/03/2016 10:43   Ct Abdomen Pelvis W Contrast  Result Date: 10/03/2016 CLINICAL DATA:  Restaging lung cancer with bone metastases. EXAM: CT CHEST, ABDOMEN, AND PELVIS WITH CONTRAST TECHNIQUE: Multidetector CT imaging of the chest, abdomen and pelvis was performed following the standard protocol during bolus administration of intravenous contrast. CONTRAST:  1065mISOVUE-300 IOPAMIDOL (ISOVUE-300) INJECTION 61% COMPARISON:  09/02/2016. FINDINGS: CT CHEST FINDINGS Cardiovascular: Normal heart size. Aortic atherosclerosis. Calcification within the LAD coronary artery noted. Mediastinum/Nodes: No mediastinal or hilar adenopathy. Low-attenuation mass within the left chest wall deep to the pectoralis muscle measures 5.2 x 2.1 cm and appears new from previous exam. Lungs/Pleura: No pleural effusion. Moderate changes of centrilobular emphysema identified. There are multiple subpleural nodules within the  posterior lower lobes. New from previous exam and suspicious for metastatic disease. Index lesion on the left measures 3.8 x 1.7 cm, image 110 of series 6. Adjacent subpleural nodule in the left lower lobe measures 1.8 x 1.5 cm, image 112 of series 6. Musculoskeletal: No  aggressive lytic or sclerotic bone lesions. CT ABDOMEN PELVIS FINDINGS Hepatobiliary: Cysts are identified within the liver. Gallbladder appears normal. No biliary dilatation. Pancreas: No inflammation or mass identified. Spleen: Normal in size without focal abnormality. Adrenals/Urinary Tract: The adrenal glands are normal. Normal appearance of the kidneys. No mass or hydronephrosis. The urinary bladder appears normal. Stomach/Bowel: The stomach is within normal limits. The small bowel loops have a normal course and caliber. No obstruction. Mild diffuse wall thickening involving the colon. No pneumatosis. No evidence for bowel perforation. Vascular/Lymphatic: Calcified atherosclerotic disease involves the abdominal aorta. No aneurysm. No enlarged retroperitoneal or mesenteric adenopathy. No enlarged pelvic or inguinal lymph nodes. Reproductive: Status post hysterectomy. No adnexal masses. Other: There is no ascites or focal fluid collections within the abdomen or pelvis. Musculoskeletal: Large lytic lesion involving the left iliac bone is again identified. This is decreased in size from previous exam measuring 9.3 x 1.5 cm, image 85 of series 2. Previously 10.8 x 4.0 cm. IMPRESSION: 1. Mixed interval response to therapy. 2. There is a new left ventral chest wall lesion deep to the pectoralis musculature worrisome for metastatic disease. 3. Posterior lower lobe nodular densities are identified which may reflect areas of pulmonary metastasis. 4. Interval decrease in size of destructive lesion involving the left iliac bone. 5. Aortic atherosclerosis. Electronically Signed   By: Kerby Moors M.D.   On: 10/03/2016 10:43    ASSESSMENT & PLAN:   #1  Metastatic poorly differentiated carcinoma with likely lung primary [non-small cell lung cancer].  CT of the head with and without contrast showed no evidence of metastatic disease. Patient notes much improved pain control. EGFR blood test mutation analysis negative. Patient's pain is much better controlled after radiation for the painful ilium met. CT chest abdomen pelvis 04/19/2016 shows no evidence of disease progression. Patient tolerated Nivolumab very well until recently when she developed grade 2 Immune colitis as a result of which Nivolumab has been discontinued. CT chest abdomen pelvis on 06/24/2016 shows no evidence of new disease or progression of metastatic disease. CT chest abdomen pelvis 09/06/2016 shows 1. Mixed interval response to therapy. 2. There is a new left ventral chest wall lesion deep to the pectoralis musculature worrisome for metastatic disease. 3. Posterior lower lobe nodular densities are identified which may reflect areas of pulmonary metastasis. 4. Interval decrease in size of destructive lesion involving the left iliac bone. #2 diarrhea- grade 3 likely related to immune colitis from her Nivolumab.  Patient had a colonoscopy with Dr. [name] that showed mild scattered colitis and biopsy showed active colitis. No pain but continues to have 4-5 stools a day She is currently on Lialda, budesonide, cholestyramine, probiotics and lomotil (not using her lomotil much) #3 hypokalemia due to diarrhea - K his by Dr. 2.8 today and patient appears somewhat dehydrated . Plan -- IV fluids with potassium and magnesium today or tommorrow. -Given oral potassium replacement (increased dose) . --Continue on Lialda, budesonide, cholestyramine, probiotics and lomotil (not using her lomotil much) -We shall try [place] Octreotide to see this might redo the secretory component of her diarrhea -GI panel and stool c diff rpt -recommended close follow-up with gastroenterology for additional treatment  option and consideration of ?Infliximab. -if the diarrhea does not improve and no infectious agent is found I will probably start her back on high-dose prednisone with slow taper and PCP prophylaxis and if that does not working will need to consider infliximab. -Or Lasix in the setting of dehydration and hypokalemia -  Patient's blood pressure is low and she was asked to cut down on her antihypertensives as recommended. -Will hold Xgeva at this time due to significant hypocalcemia  #4 DVT and PE will need to be ongoing Lovenox therapy  #5 Barrett's esophagus 4cms in the distal esophagus with low and high-grade dysplasia cannot rule out an early intra-mucosal esophageal adenocarcinoma. Plan -Patient being monitored by Dr. [name] from GI. -Patient was seen at [facility] for further management and saw Dr.Kochar, Arie Sabina, MD and Dr Adria Devon.  Return to care with Dr. Irene Limbo in 10-12 days with CBC, CMP and CT chest abdomen pelvis  . Orders Placed This Encounter  Procedures  . Clostridium Difficile by PCR    Standing Status:   Future    Standing Expiration Date:   10/04/2017    Order Specific Question:   Is your patient experiencing loose or watery stools (3 or more in 24 hours)?    Answer:   Yes    Order Specific Question:   Has the patient received laxatives in the last 24 hours?    Answer:   No    Order Specific Question:   Has a negative Cdiff test resulted in the last 7 days?    Answer:   No  . Fecal leukocytes    Standing Status:   Future    Standing Expiration Date:   10/04/2017  . CBC & Diff and Retic    Standing Status:   Future    Standing Expiration Date:   10/04/2017  . Comprehensive metabolic panel    Standing Status:   Future    Standing Expiration Date:   10/04/2017  . Calcium, ionized    Standing Status:   Future    Standing Expiration Date:   10/04/2017  . Phosphorus    Standing Status:   Future    Standing Expiration Date:   10/04/2017  . Magnesium    Standing  Status:   Future    Standing Expiration Date:   10/04/2017    All questions were answered. The patient knows to call the clinic with any problems, questions or concerns.  Sullivan Lone MD [place] Hematology/Oncology Physician [facility]  (Office): [phone] (Work cell): [phone] (Fax): [phone]

## 2016-10-10 ENCOUNTER — Telehealth: Payer: Self-pay | Admitting: Hematology

## 2016-10-10 DIAGNOSIS — C7951 Secondary malignant neoplasm of bone: Secondary | ICD-10-CM | POA: Diagnosis not present

## 2016-10-10 DIAGNOSIS — C3490 Malignant neoplasm of unspecified part of unspecified bronchus or lung: Secondary | ICD-10-CM | POA: Diagnosis not present

## 2016-10-10 DIAGNOSIS — D6869 Other thrombophilia: Secondary | ICD-10-CM | POA: Diagnosis not present

## 2016-10-10 DIAGNOSIS — C7880 Secondary malignant neoplasm of unspecified digestive organ: Secondary | ICD-10-CM | POA: Diagnosis not present

## 2016-10-10 DIAGNOSIS — K219 Gastro-esophageal reflux disease without esophagitis: Secondary | ICD-10-CM | POA: Diagnosis not present

## 2016-10-10 NOTE — Telephone Encounter (Signed)
11/10 Appointment rescheduled per patient request. The patient requested to have the appointment the following week.

## 2016-10-11 DIAGNOSIS — C3490 Malignant neoplasm of unspecified part of unspecified bronchus or lung: Secondary | ICD-10-CM | POA: Diagnosis not present

## 2016-10-11 DIAGNOSIS — C7880 Secondary malignant neoplasm of unspecified digestive organ: Secondary | ICD-10-CM | POA: Diagnosis not present

## 2016-10-11 DIAGNOSIS — D6869 Other thrombophilia: Secondary | ICD-10-CM | POA: Diagnosis not present

## 2016-10-11 DIAGNOSIS — C7951 Secondary malignant neoplasm of bone: Secondary | ICD-10-CM | POA: Diagnosis not present

## 2016-10-11 DIAGNOSIS — K219 Gastro-esophageal reflux disease without esophagitis: Secondary | ICD-10-CM | POA: Diagnosis not present

## 2016-10-12 ENCOUNTER — Encounter (INDEPENDENT_AMBULATORY_CARE_PROVIDER_SITE_OTHER): Payer: Self-pay

## 2016-10-12 ENCOUNTER — Other Ambulatory Visit (INDEPENDENT_AMBULATORY_CARE_PROVIDER_SITE_OTHER)

## 2016-10-12 ENCOUNTER — Ambulatory Visit (INDEPENDENT_AMBULATORY_CARE_PROVIDER_SITE_OTHER): Payer: Medicare Other | Admitting: Physician Assistant

## 2016-10-12 ENCOUNTER — Other Ambulatory Visit: Payer: Self-pay | Admitting: Nurse Practitioner

## 2016-10-12 ENCOUNTER — Encounter: Payer: Self-pay | Admitting: Physician Assistant

## 2016-10-12 VITALS — BP 126/66 | HR 88 | Ht 67.75 in | Wt 121.0 lb

## 2016-10-12 DIAGNOSIS — R197 Diarrhea, unspecified: Secondary | ICD-10-CM

## 2016-10-12 DIAGNOSIS — C3490 Malignant neoplasm of unspecified part of unspecified bronchus or lung: Secondary | ICD-10-CM | POA: Diagnosis not present

## 2016-10-12 DIAGNOSIS — C7880 Secondary malignant neoplasm of unspecified digestive organ: Secondary | ICD-10-CM | POA: Diagnosis not present

## 2016-10-12 DIAGNOSIS — K51018 Ulcerative (chronic) pancolitis with other complication: Secondary | ICD-10-CM | POA: Diagnosis not present

## 2016-10-12 DIAGNOSIS — K219 Gastro-esophageal reflux disease without esophagitis: Secondary | ICD-10-CM | POA: Diagnosis not present

## 2016-10-12 DIAGNOSIS — D6869 Other thrombophilia: Secondary | ICD-10-CM | POA: Diagnosis not present

## 2016-10-12 DIAGNOSIS — C7951 Secondary malignant neoplasm of bone: Secondary | ICD-10-CM | POA: Diagnosis not present

## 2016-10-12 DIAGNOSIS — I10 Essential (primary) hypertension: Secondary | ICD-10-CM

## 2016-10-12 LAB — COMPREHENSIVE METABOLIC PANEL
ALBUMIN: 2.2 g/dL — AB (ref 3.5–5.2)
ALT: 35 U/L (ref 0–35)
AST: 52 U/L — AB (ref 0–37)
Alkaline Phosphatase: 120 U/L — ABNORMAL HIGH (ref 39–117)
BILIRUBIN TOTAL: 0.4 mg/dL (ref 0.2–1.2)
BUN: 12 mg/dL (ref 6–23)
CALCIUM: 6.8 mg/dL — AB (ref 8.4–10.5)
CO2: 23 mEq/L (ref 19–32)
CREATININE: 0.97 mg/dL (ref 0.40–1.20)
Chloride: 110 mEq/L (ref 96–112)
GFR: 59.23 mL/min — ABNORMAL LOW (ref >60.00)
Glucose, Bld: 118 mg/dL — ABNORMAL HIGH (ref 70–99)
Potassium: 3.8 mEq/L (ref 3.5–5.1)
Sodium: 139 mEq/L (ref 135–145)
Total Protein: 6.4 g/dL (ref 6.0–8.3)

## 2016-10-12 LAB — CBC WITH DIFFERENTIAL/PLATELET
BASOS ABS: 0 10*3/uL (ref 0.0–0.1)
Basophils Relative: 0.4 % (ref 0.0–3.0)
EOS ABS: 0 10*3/uL (ref 0.0–0.7)
Eosinophils Relative: 0.3 % (ref 0.0–5.0)
HEMATOCRIT: 34.1 % — AB (ref 36.0–46.0)
Hemoglobin: 11.4 g/dL — ABNORMAL LOW (ref 12.0–15.0)
LYMPHS PCT: 16.8 % (ref 12.0–46.0)
Lymphs Abs: 1.4 10*3/uL (ref 0.7–4.0)
MCHC: 33.4 g/dL (ref 30.0–36.0)
MCV: 81.8 fl (ref 78.0–100.0)
MONO ABS: 0.5 10*3/uL (ref 0.1–1.0)
Monocytes Relative: 6.6 % (ref 3.0–12.0)
NEUTROS ABS: 6.2 10*3/uL (ref 1.4–7.7)
NEUTROS PCT: 75.9 % (ref 43.0–77.0)
PLATELETS: 399 10*3/uL (ref 150.0–400.0)
RBC: 4.17 Mil/uL (ref 3.87–5.11)
RDW: 19.1 % — ABNORMAL HIGH (ref 11.5–15.5)
WBC: 8.2 10*3/uL (ref 4.0–10.5)

## 2016-10-12 LAB — PHOSPHORUS: PHOSPHORUS: 1 mg/dL — AB (ref 2.3–4.6)

## 2016-10-12 LAB — MAGNESIUM: MAGNESIUM: 1.6 mg/dL (ref 1.5–2.5)

## 2016-10-12 NOTE — Progress Notes (Addendum)
Subjective:    Patient ID: Cindy Byrd, female    DOB: 1941-04-26, 75 y.o.   MRN: [id number]  HPI Cindy Byrd is a pleasant 75 year old white female known to Dr. [name] with history of metastatic poorly differentiated carcinoma likely lung primary (non-small cell lung cancer ). She is followed by Dr. Irene Limbo Most recent CT done 10/03/2016 showed new left ventral  chest wall lesion deep to the pectoralis muscle prostatic disease posterior lower lobe nodular densities which may reflect areas of pulmonary metastases and interval decrease in size of the  lesion involving the left iliac bone. 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 Patient had undergone colonoscopy with Dr. [name] in July 2017 which  did show patchy mild inflammation erosions and erythema in the entire colon, and path  showed an active colitis felt consistent with drug-induced inflammatory changes. She was seen in consultation by GI during her hospitalization and initiated budesonide 9 mg by mouth daily and Lialda 4.8 g daily. She was also eventually put on Questran 4 g by mouth twice a day, and was discharged home with Lomotil for when necessary use. Her diarrhea had improved significantly at the time of discharge. She was seen in oncology on 10/04/2016, at that time stating that she was still having 6 or 7 liquid bowel movements per day. She's remains quite debilitated and was felt to be dehydrated that day and was sent for IV fluids. Because of the persistence of the diarrhea she was also given an injection of long-acting octreotide. She comes in today with her husband. They do not have a very good understanding of her medications or polyps she is supposed to be taking some of them. She has been trying and has all of her medications  being laid out by a home health nurse. She says she continues to have some abdominal cramping and urgency but actually has been eating a bit better and her husband says that over the past 3-4 days she seems to be more animated and energetic. She says her diarrhea has finally improved over the past couple of days and that she has only had 2 bowel movements today. Cindy Byrd She was to have labs done after her recent oncology visit and stool cultures as well as stool for C. difficile but none of these things were obtained. They had multiple questions about her medications and all of these were answered.  Review of Systems Pertinent positive and negative review of systems were noted in the above HPI section.  All other review of systems was otherwise negative.  Outpatient Encounter Prescriptions as of 10/12/2016  Medication Sig  . budesonide (ENTOCORT EC) 3 MG 24 hr capsule Take 3 capsules (9 mg total) by mouth daily.  . calcium-vitamin D (OSCAL WITH D) 500-200 MG-UNIT tablet Take 2 tablets by mouth 3 (three) times daily.  . cholestyramine (QUESTRAN) 4 g packet Take 1 packet (4 g total) by mouth 2 (two) times daily.  . citalopram (CELEXA) 20 MG tablet Take 1 tablet (20 mg total) by mouth daily.  . clotrimazole-betamethasone (LOTRISONE) cream Apply 1 application topically 2 (two) times daily. Apply to perianal skin twice daily.  . diphenoxylate-atropine (LOMOTIL) 2.5-0.025 MG tablet Take 1-2 tablets by mouth 4 (four) times daily as needed for diarrhea or loose stools.  . enoxaparin (LOVENOX) 60 MG/0.6ML injection Inject 0.6 mLs (  60 mg total) into the skin every 12 (twelve) hours.  . furosemide (LASIX) 20 MG tablet Take 1 tablet (20 mg total) by mouth 2 (two) times daily.  [name] lidocaine-prilocaine (EMLA) cream Apply small amount over port 1-2 hours prior to treatment, cover with plastic wrap (DO NOT RUB IN).  [name] liver oil-zinc oxide (DESITIN) 40 % ointment Apply topically daily.  [name] LORazepam (ATIVAN) 1 MG tablet  Take 1 tablet (1 mg total) by mouth every 8 (eight) hours as needed for anxiety (or nausea).  . magnesium oxide (MAG-OX) 400 (241.3 Mg) MG tablet Take 1 tablet (400 mg total) by mouth 2 (two) times daily.  . mesalamine (LIALDA) 1.2 g EC tablet Take 2 tablets (2.4 g total) by mouth daily with breakfast.  . omeprazole (PRILOSEC) 20 MG capsule Take 1 capsule (20 mg total) by mouth daily.  . ondansetron (ZOFRAN) 8 MG tablet Take 1 tablet (8 mg total) by mouth every 8 (eight) hours as needed for nausea or vomiting.  [name] oxyCODONE-acetaminophen (PERCOCET/ROXICET) 5-325 MG tablet Take 1 tablet by mouth every 4 (four) hours as needed for severe pain.  . potassium chloride SA (K-DUR,KLOR-CON) 20 MEQ tablet Take 48mq twice daily for 3 days then 433m once daily  . PRESCRIPTION MEDICATION Antibody Plan [facility]  . saccharomyces boulardii (FLORASTOR) 250 MG capsule Take 1 capsule (250 mg total) by mouth 2 (two) times daily.  . valsartan (DIOVAN) 80 MG tablet Take 0.5 tablets (40 mg total) by mouth daily.  . [name] KitchenITAMIN D, ERGOCALCIFEROL, PO Give 0.82m54m4,000 units) by mouth once daily  . zolpidem (AMBIEN CR) 12.5 MG CR tablet Take 1 tablet (12.5 mg total) by mouth at bedtime as needed for sleep.   No facility-administered encounter medications on file as of 10/12/2016.    Allergies  Allergen Reactions  . Penicillins Other (See Comments)    Unknown; childhood allergy  . Remeron [Mirtazapine] Other (See Comments)    nightmares  . Latex Rash   Patient Active Problem List   Diagnosis Date Noted  . Metastatic lung cancer (metastasis from lung to other site), unspecified laterality ([place] . Hypocalcemia 09/08/2016  . Vitamin D deficiency 09/08/2016  . New onset a-fib ([facility]CC[place]0/04/2016  . Chest pain   . Thrush of mouth and esophagus ([facility] . Protein-calorie malnutrition, severe ([facility]CC[place] . Colitis determined by colorectal biopsy   . Bilateral pulmonary embolism (HCC[place]0/03/2016  . DVT of lower extremity,  bilateral ([facility]CC[place]0/03/2016  . Palliative care by specialist   . Goals of care, counseling/discussion   . Advance care planning   . Diarrhea   . Ulcerative pancolitis without complication ([facility] . Cancer ([place]CC[place] . Pressure injury of skin 09/02/2016  . HCAP (healthcare-associated pneumonia) 09/01/2016  . ARF (acute renal failure) ([place]CC[place]9/28/2017  . Nausea with vomiting 07/18/2016  . Dehydration 07/18/2016  . Hypokalemia 07/18/2016  . Hypoalbuminemia due to protein-calorie malnutrition (HCC[place]8/14/2017  . Peripheral edema 07/18/2016  . Diarrhea due to drug 05/19/2016  . Port catheter in place 04/07/2016  . Nicotine addiction 01/19/2016  . Barrett's esophagus determined by biopsy 09/04/2015  . Gastritis   . Primary malignant neoplasm of lung metastatic to other site (HCC[place]9/12/2014  . Esophageal reflux 08/04/2015  . Bone metastasis (HCC[place]7/20/2016  . Neoplasm related pain 06/24/2015  . Protein calorie malnutrition (HCC[place]7/20/2016  . Anorexia 06/24/2015  . Heavy smoker (more than 20 cigarettes per day) 06/24/2015  . Depression 06/24/2015  .  HTN (hypertension) 06/24/2015   Social History   Social History  . Marital status: Widowed    Spouse name: N/A  . Number of children: 2  . Years of education: N/A   Occupational History  . Not on file.   Social History Main Topics  . Smoking status: Former Smoker    Packs/day: 1.00    Years: 60.00    Types: Cigarettes    Quit date: 12/06/2015  . Smokeless tobacco: Never Used  . Alcohol use No  . Drug use: No  . Sexual activity: No   Other Topics Concern  . Not on file   Social History Narrative  . No narrative on file    Ms. Cadogan's family history includes Breast cancer (age of onset: 42) in her daughter; Colon cancer in her brother, brother, and father; Stroke in her mother.      Objective:    Vitals:   10/12/16 1510  BP: 126/66  Pulse: 88    Physical Exam  well-developed elderly white female in no acute  distress, comes in a wheelchair accompanied by her husband. Patient gives most of the history. Blood pressure 122/66 pulse 88 weight 121, BMI 18.5. HEENT; nontraumatic normocephalic EOMI PERRLA sclera anicteric, Cardiovascular; regular rate and rhythm with S1-S2 no murmur rub or gallop, Pulmonary; clear bilaterally, she has a port in the right chest wall Abdomen soft nontender nondistended bowel sounds are active there is no palpable mass or hepatosplenomegaly, Rectal; exam not done, Extremities; thin, no clubbing cyanosis or edema, Neuropsych ;mood and affect appropriate       Assessment & Plan:   #66 75 year old white female with metastatic probable non-small cell lung cancer is most recent CT scan showing a new left chest wall lesion worrisome for metastatic disease and posterior lower lobe nodular densities possibly reflecting areas of pulmonary metastasis. #2 severe immune mediated colitis secondary to [facility] recent prolonged hospitalization. She continues to have diarrhea which has gradually improved and has had a noted improvement just over the past few days. She was given an initial injection of octreotide at her last oncology visit a little over a week ago and suspect that this has accounted for her recent improvement. #3 debilitation secondary to above #4 today and if again metabolic derangement secondary to severe diarrhea during hospitalization #5 DVT and bilateral pulmonary emboli diagnosed at time of recent hospitalization-currently on Lovenox #6 history of Barrett's esophagus with high-grade dysplasia. Patient had been referred to Dr. Adria Devon at [facility]. Recent prolonged illness has interfered with follow-up and she has not yet had a repeat EGD per Dr. Adria Devon.  Plan; I reviewed medications carefully with the patient and her husband. We'll continue budesonide 9 mg by mouth daily Continue Questran 4 g by mouth twice daily but specified this should be taken at least 2 hours away from  any other medications which they had not been doing Increase Lialda to 2 tablets by mouth twice daily for a total of 4.8 g daily Will give her scheduled Lomotil at least short-term 1 twice daily Will check CBC with differential, CMET, magnesium, phosphorus, stool for C. difficile by PCR and GI pathogen panel today, to assure no other superimposed infectious component Appears that she has had response to octreotide. She has follow-up with Dr. Irene Limbo next week. Would continue long-acting octreotide , hopefully she will  have injection next week Patient will follow up with Dr. [name] or myself in one month.   Ansley Mangiapane S Aadam Zhen PA-C   Addendum: Reviewed  and agree with management in this complex patient with multiple medical issues. [name], MD   10/12/2016   Cc: Tamsen Roers, MD

## 2016-10-12 NOTE — Patient Instructions (Addendum)
Please go to the basement level to have your labs drawn and stool tests.  These tests were ordered on 10-04-2016.  These were tests Dr. Irene Limbo.   We made you an appointment with Dr. Zenovia Jarred for 12-30-2015 at 9:45 am.  In the meantime, when we get lab and stool study results back, we will call you and may schedule you for a sooner appointment with Nicoletta Ba PA if we need to.   Take Questran 2 hours away from other medications. Take lomotil every morning and before dinner. Increase Lialda to 2 tablets twice daily. Continue Budesonide 3 mg, 3 tablets every morning.

## 2016-10-13 ENCOUNTER — Other Ambulatory Visit: Payer: Medicare Other

## 2016-10-13 DIAGNOSIS — C3490 Malignant neoplasm of unspecified part of unspecified bronchus or lung: Secondary | ICD-10-CM | POA: Diagnosis not present

## 2016-10-13 DIAGNOSIS — C7951 Secondary malignant neoplasm of bone: Secondary | ICD-10-CM | POA: Diagnosis not present

## 2016-10-13 DIAGNOSIS — D6869 Other thrombophilia: Secondary | ICD-10-CM | POA: Diagnosis not present

## 2016-10-13 DIAGNOSIS — C7880 Secondary malignant neoplasm of unspecified digestive organ: Secondary | ICD-10-CM | POA: Diagnosis not present

## 2016-10-13 DIAGNOSIS — K219 Gastro-esophageal reflux disease without esophagitis: Secondary | ICD-10-CM | POA: Diagnosis not present

## 2016-10-13 DIAGNOSIS — K51018 Ulcerative (chronic) pancolitis with other complication: Secondary | ICD-10-CM

## 2016-10-13 LAB — CALCIUM, IONIZED: CALCIUM ION: 4 mg/dL — AB (ref 4.8–5.6)

## 2016-10-14 ENCOUNTER — Other Ambulatory Visit: Payer: Self-pay

## 2016-10-14 ENCOUNTER — Other Ambulatory Visit: Payer: Medicare Other

## 2016-10-14 ENCOUNTER — Ambulatory Visit: Payer: Medicare Other | Admitting: Hematology

## 2016-10-14 ENCOUNTER — Telehealth: Payer: Self-pay | Admitting: Physician Assistant

## 2016-10-14 DIAGNOSIS — C7951 Secondary malignant neoplasm of bone: Secondary | ICD-10-CM | POA: Diagnosis not present

## 2016-10-14 DIAGNOSIS — R197 Diarrhea, unspecified: Secondary | ICD-10-CM

## 2016-10-14 DIAGNOSIS — K219 Gastro-esophageal reflux disease without esophagitis: Secondary | ICD-10-CM | POA: Diagnosis not present

## 2016-10-14 DIAGNOSIS — C7880 Secondary malignant neoplasm of unspecified digestive organ: Secondary | ICD-10-CM | POA: Diagnosis not present

## 2016-10-14 DIAGNOSIS — C3490 Malignant neoplasm of unspecified part of unspecified bronchus or lung: Secondary | ICD-10-CM | POA: Diagnosis not present

## 2016-10-14 DIAGNOSIS — D6869 Other thrombophilia: Secondary | ICD-10-CM | POA: Diagnosis not present

## 2016-10-14 LAB — GASTROINTESTINAL PATHOGEN PANEL PCR
C. difficile Tox A/B, PCR: DETECTED — CR
CAMPYLOBACTER, PCR: NOT DETECTED
CRYPTOSPORIDIUM, PCR: NOT DETECTED
E coli (ETEC) LT/ST PCR: NOT DETECTED
E coli (STEC) stx1/stx2, PCR: NOT DETECTED
E coli 0157, PCR: NOT DETECTED
GIARDIA LAMBLIA, PCR: NOT DETECTED
NOROVIRUS, PCR: NOT DETECTED
ROTAVIRUS, PCR: NOT DETECTED
SHIGELLA, PCR: NOT DETECTED
Salmonella, PCR: NOT DETECTED

## 2016-10-14 LAB — CLOSTRIDIUM DIFFICILE BY PCR: CDIFFPCR: DETECTED — AB

## 2016-10-14 MED ORDER — CHOLESTYRAMINE 4 G PO PACK: 4.0000 g | PACK | Freq: Two times a day (BID) | ORAL | 0 refills | Status: DC

## 2016-10-14 MED ORDER — VANCOMYCIN HCL 125 MG PO CAPS: 125.0000 mg | ORAL_CAPSULE | Freq: Four times a day (QID) | ORAL | 0 refills | Status: DC

## 2016-10-14 NOTE — Telephone Encounter (Signed)
Discussed with Dr Carlean Purl who is doctor of the day covering. Patient contacted and instructed.

## 2016-10-17 ENCOUNTER — Other Ambulatory Visit: Payer: Self-pay | Admitting: *Deleted

## 2016-10-17 ENCOUNTER — Telehealth: Payer: Self-pay | Admitting: *Deleted

## 2016-10-17 DIAGNOSIS — D6869 Other thrombophilia: Secondary | ICD-10-CM | POA: Diagnosis not present

## 2016-10-17 DIAGNOSIS — C7880 Secondary malignant neoplasm of unspecified digestive organ: Secondary | ICD-10-CM | POA: Diagnosis not present

## 2016-10-17 DIAGNOSIS — K219 Gastro-esophageal reflux disease without esophagitis: Secondary | ICD-10-CM | POA: Diagnosis not present

## 2016-10-17 DIAGNOSIS — I2699 Other pulmonary embolism without acute cor pulmonale: Secondary | ICD-10-CM

## 2016-10-17 DIAGNOSIS — R197 Diarrhea, unspecified: Secondary | ICD-10-CM

## 2016-10-17 DIAGNOSIS — C3490 Malignant neoplasm of unspecified part of unspecified bronchus or lung: Secondary | ICD-10-CM | POA: Diagnosis not present

## 2016-10-17 DIAGNOSIS — C7951 Secondary malignant neoplasm of bone: Secondary | ICD-10-CM | POA: Diagnosis not present

## 2016-10-17 MED ORDER — SACCHAROMYCES BOULARDII 250 MG PO CAPS: 250.0000 mg | ORAL_CAPSULE | Freq: Two times a day (BID) | ORAL | 0 refills | Status: DC

## 2016-10-17 MED ORDER — ENOXAPARIN SODIUM 60 MG/0.6ML ~~LOC~~ SOLN: 60.0000 mg | Freq: Two times a day (BID) | SUBCUTANEOUS | 0 refills | Status: DC

## 2016-10-17 NOTE — Telephone Encounter (Signed)
"  this is Journalist, newspaper with Hospice calling requesting refills Hospice cannot cover for this patient.  She needs Lovenox and Floraster from [facility] at [place]."

## 2016-10-18 ENCOUNTER — Ambulatory Visit: Payer: Medicare Other

## 2016-10-20 ENCOUNTER — Ambulatory Visit (HOSPITAL_BASED_OUTPATIENT_CLINIC_OR_DEPARTMENT_OTHER)

## 2016-10-20 ENCOUNTER — Ambulatory Visit (HOSPITAL_BASED_OUTPATIENT_CLINIC_OR_DEPARTMENT_OTHER): Payer: Medicare Other | Admitting: Nurse Practitioner

## 2016-10-20 ENCOUNTER — Other Ambulatory Visit (HOSPITAL_BASED_OUTPATIENT_CLINIC_OR_DEPARTMENT_OTHER)

## 2016-10-20 ENCOUNTER — Other Ambulatory Visit: Payer: Self-pay | Admitting: *Deleted

## 2016-10-20 ENCOUNTER — Encounter: Payer: Self-pay | Admitting: Hematology

## 2016-10-20 ENCOUNTER — Ambulatory Visit (HOSPITAL_BASED_OUTPATIENT_CLINIC_OR_DEPARTMENT_OTHER): Payer: Medicare Other | Admitting: Hematology

## 2016-10-20 VITALS — BP 123/71 | HR 62 | Temp 98.6°F | Resp 18 | Ht 67.75 in | Wt 121.4 lb

## 2016-10-20 DIAGNOSIS — E860 Dehydration: Secondary | ICD-10-CM | POA: Diagnosis not present

## 2016-10-20 DIAGNOSIS — C7951 Secondary malignant neoplasm of bone: Secondary | ICD-10-CM

## 2016-10-20 DIAGNOSIS — C7800 Secondary malignant neoplasm of unspecified lung: Secondary | ICD-10-CM

## 2016-10-20 DIAGNOSIS — E876 Hypokalemia: Secondary | ICD-10-CM

## 2016-10-20 DIAGNOSIS — R197 Diarrhea, unspecified: Secondary | ICD-10-CM

## 2016-10-20 DIAGNOSIS — C3490 Malignant neoplasm of unspecified part of unspecified bronchus or lung: Secondary | ICD-10-CM

## 2016-10-20 DIAGNOSIS — C3491 Malignant neoplasm of unspecified part of right bronchus or lung: Secondary | ICD-10-CM

## 2016-10-20 DIAGNOSIS — C7880 Secondary malignant neoplasm of unspecified digestive organ: Secondary | ICD-10-CM | POA: Diagnosis not present

## 2016-10-20 DIAGNOSIS — A0472 Enterocolitis due to Clostridium difficile, not specified as recurrent: Secondary | ICD-10-CM | POA: Diagnosis not present

## 2016-10-20 DIAGNOSIS — K219 Gastro-esophageal reflux disease without esophagitis: Secondary | ICD-10-CM | POA: Diagnosis not present

## 2016-10-20 DIAGNOSIS — D6869 Other thrombophilia: Secondary | ICD-10-CM | POA: Diagnosis not present

## 2016-10-20 LAB — COMPREHENSIVE METABOLIC PANEL
ALK PHOS: 170 U/L — AB (ref 40–150)
ALT: 34 U/L (ref 0–55)
AST: 44 U/L — ABNORMAL HIGH (ref 5–34)
Anion Gap: 8 mEq/L (ref 3–11)
BUN: 6.2 mg/dL — ABNORMAL LOW (ref 7.0–26.0)
CO2: 17 meq/L — AB (ref 22–29)
Calcium: 6.3 mg/dL — CL (ref 8.4–10.4)
Chloride: 113 mEq/L — ABNORMAL HIGH (ref 98–109)
Creatinine: 0.8 mg/dL (ref 0.6–1.1)
EGFR: 76 mL/min/{1.73_m2} — AB (ref >90)
GLUCOSE: 131 mg/dL (ref 70–140)
POTASSIUM: 2.9 meq/L — AB (ref 3.5–5.1)
SODIUM: 138 meq/L (ref 136–145)
Total Bilirubin: 0.57 mg/dL (ref 0.20–1.20)
Total Protein: 5.8 g/dL — ABNORMAL LOW (ref 6.4–8.3)

## 2016-10-20 LAB — CBC & DIFF AND RETIC
BASO%: 0.9 % (ref 0.0–2.0)
BASOS ABS: 0.1 10*3/uL (ref 0.0–0.1)
EOS ABS: 0 10*3/uL (ref 0.0–0.5)
EOS%: 0.5 % (ref 0.0–7.0)
HEMATOCRIT: 31.3 % — AB (ref 34.8–46.6)
HEMOGLOBIN: 10.4 g/dL — AB (ref 11.6–15.9)
IMMATURE RETIC FRACT: 8.4 % (ref 1.60–10.00)
LYMPH%: 14.9 % (ref 14.0–49.7)
MCH: 27.4 pg (ref 25.1–34.0)
MCHC: 33.2 g/dL (ref 31.5–36.0)
MCV: 82.4 fL (ref 79.5–101.0)
MONO#: 0.5 10*3/uL (ref 0.1–0.9)
MONO%: 5.9 % (ref 0.0–14.0)
NEUT%: 77.8 % — AB (ref 38.4–76.8)
NEUTROS ABS: 6.2 10*3/uL (ref 1.5–6.5)
PLATELETS: 216 10*3/uL (ref 145–400)
RBC: 3.8 10*6/uL (ref 3.70–5.45)
RDW: 18.2 % — ABNORMAL HIGH (ref 11.2–14.5)
Retic %: 1.87 % (ref 0.70–2.10)
Retic Ct Abs: 71.06 10*3/uL (ref 33.70–90.70)
WBC: 7.9 10*3/uL (ref 3.9–10.3)
lymph#: 1.2 10*3/uL (ref 0.9–3.3)

## 2016-10-20 LAB — MAGNESIUM

## 2016-10-20 MED ORDER — SODIUM CHLORIDE 0.9% FLUSH
10.0000 mL | Freq: Once | INTRAVENOUS | Status: AC
  Administered 2016-10-20: 10 mL via INTRAVENOUS
  Filled 2016-10-20: qty 10

## 2016-10-20 MED ORDER — POTASSIUM CHLORIDE CRYS ER 20 MEQ PO TBCR: EXTENDED_RELEASE_TABLET | ORAL | 0 refills | Status: DC

## 2016-10-20 MED ORDER — SODIUM CHLORIDE 0.9 % IV SOLN: INTRAVENOUS | Status: DC

## 2016-10-20 MED ORDER — DENOSUMAB 120 MG/1.7ML ~~LOC~~ SOLN
120.0000 mg | Freq: Once | SUBCUTANEOUS | Status: AC
Start: 2016-10-20 — End: 2016-10-20
  Administered 2016-10-20: 120 mg via SUBCUTANEOUS
  Filled 2016-10-20: qty 1.7

## 2016-10-20 MED ORDER — SODIUM CHLORIDE 0.9 % IV SOLN
Freq: Once | INTRAVENOUS | Status: AC
  Administered 2016-10-20: 15:00:00 via INTRAVENOUS
  Filled 2016-10-20: qty 1000

## 2016-10-20 MED ORDER — HEPARIN SOD (PORK) LOCK FLUSH 100 UNIT/ML IV SOLN
500.0000 [IU] | Freq: Once | INTRAVENOUS | Status: AC
  Administered 2016-10-20: 500 [IU] via INTRAVENOUS
  Filled 2016-10-20: qty 5

## 2016-10-20 NOTE — Patient Instructions (Signed)
Dehydration, Adult Dehydration is a condition in which there is not enough fluid or water in the body. This happens when you lose more fluids than you take in. Important organs, such as the kidneys, brain, and heart, cannot function without a proper amount of fluids. Any loss of fluids from the body can lead to dehydration. Dehydration can range from mild to severe. This condition should be treated right away to prevent it from becoming severe. What are the causes? This condition may be caused by:  Vomiting.  Diarrhea.  Excessive sweating, such as from heat exposure or exercise.  Not drinking enough fluid, especially:  When ill.  While doing activity that requires a lot of energy.  Excessive urination.  Fever.  Infection.  Certain medicines, such as medicines that cause the body to lose excess fluid (diuretics).  Inability to access safe drinking water.  Reduced physical ability to get adequate water and food. What increases the risk? This condition is more likely to develop in people:  Who have a poorly controlled long-term (chronic) illness, such as diabetes, heart disease, or kidney disease.  Who are age 65 or older.  Who are disabled.  Who live in a place with high altitude.  Who play endurance sports. What are the signs or symptoms? Symptoms of mild dehydration may include:   Thirst.  Dry lips.  Slightly dry mouth.  Dry, warm skin.  Dizziness. Symptoms of moderate dehydration may include:   Very dry mouth.  Muscle cramps.  Dark urine. Urine may be the color of tea.  Decreased urine production.  Decreased tear production.  Heartbeat that is irregular or faster than normal (palpitations).  Headache.  Light-headedness, especially when you stand up from a sitting position.  Fainting (syncope). Symptoms of severe dehydration may include:   Changes in skin, such as:  Cold and clammy skin.  Blotchy (mottled) or pale skin.  Skin that does  not quickly return to normal after being lightly pinched and released (poor skin turgor).  Changes in body fluids, such as:  Extreme thirst.  No tear production.  Inability to sweat when body temperature is high, such as in hot weather.  Very little urine production.  Changes in vital signs, such as:  Weak pulse.  Pulse that is more than 100 beats a minute when sitting still.  Rapid breathing.  Low blood pressure.  Other changes, such as:  Sunken eyes.  Cold hands and feet.  Confusion.  Lack of energy (lethargy).  Difficulty waking up from sleep.  Short-term weight loss.  Unconsciousness. How is this diagnosed? This condition is diagnosed based on your symptoms and a physical exam. Blood and urine tests may be done to help confirm the diagnosis. How is this treated? Treatment for this condition depends on the severity. Mild or moderate dehydration can often be treated at home. Treatment should be started right away. Do not wait until dehydration becomes severe. Severe dehydration is an emergency and it needs to be treated in a hospital. Treatment for mild dehydration may include:   Drinking more fluids.  Replacing salts and minerals in your blood (electrolytes) that you may have lost. Treatment for moderate dehydration may include:   Drinking an oral rehydration solution (ORS). This is a drink that helps you replace fluids and electrolytes (rehydrate). It can be found at pharmacies and retail stores. Treatment for severe dehydration may include:   Receiving fluids through an IV tube.  Receiving an electrolyte solution through a feeding tube that is   passed through your nose and into your stomach (nasogastric tube, or NG tube).  Correcting any abnormalities in electrolytes.  Treating the underlying cause of dehydration. Follow these instructions at home:  If directed by your health care provider, drink an ORS:  Make an ORS by following instructions on the  package.  Start by drinking small amounts, about  cup (120 mL) every 5-10 minutes.  Slowly increase how much you drink until you have taken the amount recommended by your health care provider.  Drink enough clear fluid to keep your urine clear or pale yellow. If you were told to drink an ORS, finish the ORS first, then start slowly drinking other clear fluids. Drink fluids such as:  Water. Do not drink only water. Doing that can lead to having too little salt (sodium) in the body (hyponatremia).  Ice chips.  Fruit juice that you have added water to (diluted fruit juice).  Low-calorie sports drinks.  Avoid:  Alcohol.  Drinks that contain a lot of sugar. These include high-calorie sports drinks, fruit juice that is not diluted, and soda.  Caffeine.  Foods that are greasy or contain a lot of fat or sugar.  Take over-the-counter and prescription medicines only as told by your health care provider.  Do not take sodium tablets. This can lead to having too much sodium in the body (hypernatremia).  Eat foods that contain a healthy balance of electrolytes, such as bananas, oranges, potatoes, tomatoes, and spinach.  Keep all follow-up visits as told by your health care provider. This is important. Contact a health care provider if:  You have abdominal pain that:  Gets worse.  Stays in one area (localizes).  You have a rash.  You have a stiff neck.  You are more irritable than usual.  You are sleepier or more difficult to wake up than usual.  You feel weak or dizzy.  You feel very thirsty.  You have urinated only a small amount of very dark urine over 6-8 hours. Get help right away if:  You have symptoms of severe dehydration.  You cannot drink fluids without vomiting.  Your symptoms get worse with treatment.  You have a fever.  You have a severe headache.  You have vomiting or diarrhea that:  Gets worse.  Does not go away.  You have blood or green matter  (bile) in your vomit.  You have blood in your stool. This may cause stool to look black and tarry.  You have not urinated in 6-8 hours.  You faint.  Your heart rate while sitting still is over 100 beats a minute.  You have trouble breathing. This information is not intended to replace advice given to you by your health care provider. Make sure you discuss any questions you have with your health care provider. Document Released: 11/21/2005 Document Revised: 06/17/2016 Document Reviewed: 01/15/2016 Elsevier Interactive Patient Education  2017 [employer]vier Inc.  

## 2016-10-20 NOTE — Patient Instructions (Signed)

## 2016-10-21 DIAGNOSIS — D6869 Other thrombophilia: Secondary | ICD-10-CM | POA: Diagnosis not present

## 2016-10-21 DIAGNOSIS — C7880 Secondary malignant neoplasm of unspecified digestive organ: Secondary | ICD-10-CM | POA: Diagnosis not present

## 2016-10-21 DIAGNOSIS — K219 Gastro-esophageal reflux disease without esophagitis: Secondary | ICD-10-CM | POA: Diagnosis not present

## 2016-10-21 DIAGNOSIS — C7951 Secondary malignant neoplasm of bone: Secondary | ICD-10-CM | POA: Diagnosis not present

## 2016-10-21 DIAGNOSIS — C3490 Malignant neoplasm of unspecified part of unspecified bronchus or lung: Secondary | ICD-10-CM | POA: Diagnosis not present

## 2016-10-21 LAB — CALCIUM, IONIZED: CALCIUM, IONIZED, SERUM: 3.8 mg/dL — AB (ref 4.5–5.6)

## 2016-10-21 LAB — PHOSPHORUS: Phosphorus, Ser: 1.6 mg/dL — ABNORMAL LOW (ref 2.5–4.5)

## 2016-10-25 DIAGNOSIS — D6869 Other thrombophilia: Secondary | ICD-10-CM | POA: Diagnosis not present

## 2016-10-25 DIAGNOSIS — C7880 Secondary malignant neoplasm of unspecified digestive organ: Secondary | ICD-10-CM | POA: Diagnosis not present

## 2016-10-25 DIAGNOSIS — C7951 Secondary malignant neoplasm of bone: Secondary | ICD-10-CM | POA: Diagnosis not present

## 2016-10-25 DIAGNOSIS — C3490 Malignant neoplasm of unspecified part of unspecified bronchus or lung: Secondary | ICD-10-CM | POA: Diagnosis not present

## 2016-10-25 DIAGNOSIS — K219 Gastro-esophageal reflux disease without esophagitis: Secondary | ICD-10-CM | POA: Diagnosis not present

## 2016-10-26 ENCOUNTER — Other Ambulatory Visit: Payer: Self-pay | Admitting: Hematology

## 2016-10-26 DIAGNOSIS — E876 Hypokalemia: Secondary | ICD-10-CM

## 2016-10-28 DIAGNOSIS — D6869 Other thrombophilia: Secondary | ICD-10-CM | POA: Diagnosis not present

## 2016-10-28 DIAGNOSIS — C7880 Secondary malignant neoplasm of unspecified digestive organ: Secondary | ICD-10-CM | POA: Diagnosis not present

## 2016-10-28 DIAGNOSIS — C7951 Secondary malignant neoplasm of bone: Secondary | ICD-10-CM | POA: Diagnosis not present

## 2016-10-28 DIAGNOSIS — C3490 Malignant neoplasm of unspecified part of unspecified bronchus or lung: Secondary | ICD-10-CM | POA: Diagnosis not present

## 2016-10-28 DIAGNOSIS — K219 Gastro-esophageal reflux disease without esophagitis: Secondary | ICD-10-CM | POA: Diagnosis not present

## 2016-10-30 ENCOUNTER — Other Ambulatory Visit: Payer: Self-pay | Admitting: Internal Medicine

## 2016-10-31 ENCOUNTER — Other Ambulatory Visit: Payer: Self-pay | Admitting: *Deleted

## 2016-10-31 DIAGNOSIS — C3490 Malignant neoplasm of unspecified part of unspecified bronchus or lung: Secondary | ICD-10-CM

## 2016-10-31 DIAGNOSIS — K219 Gastro-esophageal reflux disease without esophagitis: Secondary | ICD-10-CM | POA: Diagnosis not present

## 2016-10-31 DIAGNOSIS — D6869 Other thrombophilia: Secondary | ICD-10-CM | POA: Diagnosis not present

## 2016-10-31 DIAGNOSIS — C7880 Secondary malignant neoplasm of unspecified digestive organ: Secondary | ICD-10-CM | POA: Diagnosis not present

## 2016-10-31 DIAGNOSIS — C7951 Secondary malignant neoplasm of bone: Secondary | ICD-10-CM | POA: Diagnosis not present

## 2016-10-31 NOTE — Progress Notes (Signed)
Cbc

## 2016-11-01 ENCOUNTER — Ambulatory Visit: Payer: Medicare Other

## 2016-11-01 ENCOUNTER — Other Ambulatory Visit: Payer: Medicare Other

## 2016-11-01 DIAGNOSIS — C7880 Secondary malignant neoplasm of unspecified digestive organ: Secondary | ICD-10-CM | POA: Diagnosis not present

## 2016-11-01 DIAGNOSIS — D6869 Other thrombophilia: Secondary | ICD-10-CM | POA: Diagnosis not present

## 2016-11-01 DIAGNOSIS — K219 Gastro-esophageal reflux disease without esophagitis: Secondary | ICD-10-CM | POA: Diagnosis not present

## 2016-11-01 DIAGNOSIS — C3490 Malignant neoplasm of unspecified part of unspecified bronchus or lung: Secondary | ICD-10-CM | POA: Diagnosis not present

## 2016-11-01 DIAGNOSIS — C7951 Secondary malignant neoplasm of bone: Secondary | ICD-10-CM | POA: Diagnosis not present

## 2016-11-03 ENCOUNTER — Other Ambulatory Visit: Payer: Self-pay | Admitting: Hematology

## 2016-11-03 ENCOUNTER — Ambulatory Visit (HOSPITAL_COMMUNITY)
Admission: RE | Admit: 2016-11-03 | Discharge: 2016-11-03 | Disposition: A | Discharge status: Home / Self Care | Source: Ambulatory Visit | Attending: Hematology | Admitting: Hematology

## 2016-11-03 ENCOUNTER — Other Ambulatory Visit: Payer: Self-pay | Admitting: *Deleted

## 2016-11-03 ENCOUNTER — Ambulatory Visit (HOSPITAL_BASED_OUTPATIENT_CLINIC_OR_DEPARTMENT_OTHER)

## 2016-11-03 ENCOUNTER — Ambulatory Visit: Payer: Medicare Other

## 2016-11-03 VITALS — BP 102/51 | HR 80 | Resp 18

## 2016-11-03 DIAGNOSIS — S2242XA Multiple fractures of ribs, left side, initial encounter for closed fracture: Secondary | ICD-10-CM | POA: Diagnosis not present

## 2016-11-03 DIAGNOSIS — D6869 Other thrombophilia: Secondary | ICD-10-CM | POA: Diagnosis not present

## 2016-11-03 DIAGNOSIS — C7951 Secondary malignant neoplasm of bone: Secondary | ICD-10-CM | POA: Diagnosis not present

## 2016-11-03 DIAGNOSIS — J9811 Atelectasis: Secondary | ICD-10-CM | POA: Insufficient documentation

## 2016-11-03 DIAGNOSIS — K219 Gastro-esophageal reflux disease without esophagitis: Secondary | ICD-10-CM | POA: Diagnosis not present

## 2016-11-03 DIAGNOSIS — C7880 Secondary malignant neoplasm of unspecified digestive organ: Secondary | ICD-10-CM | POA: Diagnosis not present

## 2016-11-03 DIAGNOSIS — T1490XA Injury, unspecified, initial encounter: Secondary | ICD-10-CM

## 2016-11-03 DIAGNOSIS — X58XXXA Exposure to other specified factors, initial encounter: Secondary | ICD-10-CM | POA: Insufficient documentation

## 2016-11-03 DIAGNOSIS — C3491 Malignant neoplasm of unspecified part of right bronchus or lung: Secondary | ICD-10-CM

## 2016-11-03 DIAGNOSIS — C3490 Malignant neoplasm of unspecified part of unspecified bronchus or lung: Secondary | ICD-10-CM

## 2016-11-03 DIAGNOSIS — Z95828 Presence of other vascular implants and grafts: Secondary | ICD-10-CM

## 2016-11-03 MED ORDER — SODIUM CHLORIDE 0.9 % IV SOLN
Freq: Once | INTRAVENOUS | Status: DC | PRN
Start: 2016-11-03 — End: 2016-11-03

## 2016-11-03 MED ORDER — METHYLPREDNISOLONE SODIUM SUCC 125 MG IJ SOLR: 125.0000 mg | Freq: Once | INTRAMUSCULAR | Status: DC | PRN

## 2016-11-03 MED ORDER — ALBUTEROL SULFATE (2.5 MG/3ML) 0.083% IN NEBU
2.5000 mg | INHALATION_SOLUTION | Freq: Once | RESPIRATORY_TRACT | Status: DC | PRN
  Filled 2016-11-03: qty 3

## 2016-11-03 MED ORDER — OCTREOTIDE ACETATE 30 MG IM KIT
30.0000 mg | PACK | Freq: Once | INTRAMUSCULAR | Status: AC
Start: 2016-11-03 — End: 2016-11-03
  Administered 2016-11-03: 30 mg via INTRAMUSCULAR
  Filled 2016-11-03: qty 1

## 2016-11-03 MED ORDER — EPINEPHRINE HCL 0.1 MG/ML IJ SOLN: 0.2500 mg | Freq: Once | INTRAMUSCULAR | Status: DC | PRN

## 2016-11-03 MED ORDER — DIPHENHYDRAMINE HCL 50 MG/ML IJ SOLN: 50.0000 mg | Freq: Once | INTRAMUSCULAR | Status: DC | PRN

## 2016-11-03 MED ORDER — DIPHENHYDRAMINE HCL 50 MG/ML IJ SOLN: 25.0000 mg | Freq: Once | INTRAMUSCULAR | Status: DC | PRN

## 2016-11-04 DIAGNOSIS — C7880 Secondary malignant neoplasm of unspecified digestive organ: Secondary | ICD-10-CM | POA: Diagnosis not present

## 2016-11-04 DIAGNOSIS — K219 Gastro-esophageal reflux disease without esophagitis: Secondary | ICD-10-CM | POA: Diagnosis not present

## 2016-11-04 DIAGNOSIS — D6869 Other thrombophilia: Secondary | ICD-10-CM | POA: Diagnosis not present

## 2016-11-04 DIAGNOSIS — C3490 Malignant neoplasm of unspecified part of unspecified bronchus or lung: Secondary | ICD-10-CM | POA: Diagnosis not present

## 2016-11-04 DIAGNOSIS — C7951 Secondary malignant neoplasm of bone: Secondary | ICD-10-CM | POA: Diagnosis not present

## 2016-11-06 ENCOUNTER — Other Ambulatory Visit: Payer: Self-pay | Admitting: Nurse Practitioner

## 2016-11-06 DIAGNOSIS — I10 Essential (primary) hypertension: Secondary | ICD-10-CM

## 2016-11-10 ENCOUNTER — Other Ambulatory Visit: Payer: Self-pay | Admitting: Physician Assistant

## 2016-11-10 DIAGNOSIS — R197 Diarrhea, unspecified: Secondary | ICD-10-CM

## 2016-11-11 DIAGNOSIS — C7951 Secondary malignant neoplasm of bone: Secondary | ICD-10-CM | POA: Diagnosis not present

## 2016-11-11 DIAGNOSIS — C3490 Malignant neoplasm of unspecified part of unspecified bronchus or lung: Secondary | ICD-10-CM | POA: Diagnosis not present

## 2016-11-11 DIAGNOSIS — K219 Gastro-esophageal reflux disease without esophagitis: Secondary | ICD-10-CM | POA: Diagnosis not present

## 2016-11-11 DIAGNOSIS — C7880 Secondary malignant neoplasm of unspecified digestive organ: Secondary | ICD-10-CM | POA: Diagnosis not present

## 2016-11-11 DIAGNOSIS — D6869 Other thrombophilia: Secondary | ICD-10-CM | POA: Diagnosis not present

## 2016-11-14 DIAGNOSIS — D6869 Other thrombophilia: Secondary | ICD-10-CM | POA: Diagnosis not present

## 2016-11-14 DIAGNOSIS — C7880 Secondary malignant neoplasm of unspecified digestive organ: Secondary | ICD-10-CM | POA: Diagnosis not present

## 2016-11-14 DIAGNOSIS — K219 Gastro-esophageal reflux disease without esophagitis: Secondary | ICD-10-CM | POA: Diagnosis not present

## 2016-11-14 DIAGNOSIS — C7951 Secondary malignant neoplasm of bone: Secondary | ICD-10-CM | POA: Diagnosis not present

## 2016-11-14 DIAGNOSIS — C3490 Malignant neoplasm of unspecified part of unspecified bronchus or lung: Secondary | ICD-10-CM | POA: Diagnosis not present

## 2016-11-16 ENCOUNTER — Other Ambulatory Visit: Payer: Self-pay | Admitting: Hematology

## 2016-11-16 DIAGNOSIS — D6869 Other thrombophilia: Secondary | ICD-10-CM | POA: Diagnosis not present

## 2016-11-16 DIAGNOSIS — C3490 Malignant neoplasm of unspecified part of unspecified bronchus or lung: Secondary | ICD-10-CM | POA: Diagnosis not present

## 2016-11-16 DIAGNOSIS — K219 Gastro-esophageal reflux disease without esophagitis: Secondary | ICD-10-CM | POA: Diagnosis not present

## 2016-11-16 DIAGNOSIS — C7951 Secondary malignant neoplasm of bone: Secondary | ICD-10-CM | POA: Diagnosis not present

## 2016-11-16 DIAGNOSIS — E876 Hypokalemia: Secondary | ICD-10-CM

## 2016-11-16 DIAGNOSIS — C7880 Secondary malignant neoplasm of unspecified digestive organ: Secondary | ICD-10-CM | POA: Diagnosis not present

## 2016-11-17 ENCOUNTER — Ambulatory Visit: Payer: Medicare Other

## 2016-11-17 ENCOUNTER — Other Ambulatory Visit: Payer: Medicare Other

## 2016-11-17 ENCOUNTER — Ambulatory Visit: Payer: Medicare Other | Admitting: Hematology

## 2016-11-18 ENCOUNTER — Other Ambulatory Visit: Payer: Self-pay | Admitting: *Deleted

## 2016-11-18 DIAGNOSIS — K219 Gastro-esophageal reflux disease without esophagitis: Secondary | ICD-10-CM | POA: Diagnosis not present

## 2016-11-18 DIAGNOSIS — C7951 Secondary malignant neoplasm of bone: Secondary | ICD-10-CM | POA: Diagnosis not present

## 2016-11-18 DIAGNOSIS — C7880 Secondary malignant neoplasm of unspecified digestive organ: Secondary | ICD-10-CM | POA: Diagnosis not present

## 2016-11-18 DIAGNOSIS — C3490 Malignant neoplasm of unspecified part of unspecified bronchus or lung: Secondary | ICD-10-CM | POA: Diagnosis not present

## 2016-11-18 DIAGNOSIS — D6869 Other thrombophilia: Secondary | ICD-10-CM | POA: Diagnosis not present

## 2016-11-18 DIAGNOSIS — E876 Hypokalemia: Secondary | ICD-10-CM

## 2016-11-18 MED ORDER — POTASSIUM CHLORIDE CRYS ER 20 MEQ PO TBCR: EXTENDED_RELEASE_TABLET | ORAL | 0 refills | Status: DC

## 2016-11-21 ENCOUNTER — Other Ambulatory Visit: Payer: Self-pay | Admitting: Hematology

## 2016-11-21 ENCOUNTER — Other Ambulatory Visit: Payer: Self-pay | Admitting: Nurse Practitioner

## 2016-11-21 DIAGNOSIS — C3490 Malignant neoplasm of unspecified part of unspecified bronchus or lung: Secondary | ICD-10-CM | POA: Diagnosis not present

## 2016-11-21 DIAGNOSIS — C7880 Secondary malignant neoplasm of unspecified digestive organ: Secondary | ICD-10-CM | POA: Diagnosis not present

## 2016-11-21 DIAGNOSIS — K219 Gastro-esophageal reflux disease without esophagitis: Secondary | ICD-10-CM | POA: Diagnosis not present

## 2016-11-21 DIAGNOSIS — R197 Diarrhea, unspecified: Secondary | ICD-10-CM

## 2016-11-21 DIAGNOSIS — D6869 Other thrombophilia: Secondary | ICD-10-CM | POA: Diagnosis not present

## 2016-11-21 DIAGNOSIS — C7951 Secondary malignant neoplasm of bone: Secondary | ICD-10-CM | POA: Diagnosis not present

## 2016-11-21 DIAGNOSIS — I2699 Other pulmonary embolism without acute cor pulmonale: Secondary | ICD-10-CM

## 2016-11-23 DIAGNOSIS — C7951 Secondary malignant neoplasm of bone: Secondary | ICD-10-CM | POA: Diagnosis not present

## 2016-11-23 DIAGNOSIS — C3490 Malignant neoplasm of unspecified part of unspecified bronchus or lung: Secondary | ICD-10-CM | POA: Diagnosis not present

## 2016-11-23 DIAGNOSIS — K219 Gastro-esophageal reflux disease without esophagitis: Secondary | ICD-10-CM | POA: Diagnosis not present

## 2016-11-23 DIAGNOSIS — D6869 Other thrombophilia: Secondary | ICD-10-CM | POA: Diagnosis not present

## 2016-11-23 DIAGNOSIS — C7880 Secondary malignant neoplasm of unspecified digestive organ: Secondary | ICD-10-CM | POA: Diagnosis not present

## 2016-11-24 ENCOUNTER — Other Ambulatory Visit: Payer: Self-pay | Admitting: Hematology

## 2016-11-24 ENCOUNTER — Other Ambulatory Visit: Payer: Self-pay | Admitting: *Deleted

## 2016-11-24 DIAGNOSIS — R197 Diarrhea, unspecified: Secondary | ICD-10-CM

## 2016-11-24 DIAGNOSIS — C7951 Secondary malignant neoplasm of bone: Secondary | ICD-10-CM | POA: Diagnosis not present

## 2016-11-24 DIAGNOSIS — C3490 Malignant neoplasm of unspecified part of unspecified bronchus or lung: Secondary | ICD-10-CM | POA: Diagnosis not present

## 2016-11-24 DIAGNOSIS — K219 Gastro-esophageal reflux disease without esophagitis: Secondary | ICD-10-CM | POA: Diagnosis not present

## 2016-11-24 DIAGNOSIS — D6869 Other thrombophilia: Secondary | ICD-10-CM | POA: Diagnosis not present

## 2016-11-24 DIAGNOSIS — C7880 Secondary malignant neoplasm of unspecified digestive organ: Secondary | ICD-10-CM | POA: Diagnosis not present

## 2016-11-24 MED ORDER — SACCHAROMYCES BOULARDII 250 MG PO CAPS: 250.0000 mg | ORAL_CAPSULE | Freq: Two times a day (BID) | ORAL | 0 refills | Status: DC

## 2016-11-25 NOTE — Progress Notes (Signed)
reviewed

## 2016-11-26 DIAGNOSIS — C7880 Secondary malignant neoplasm of unspecified digestive organ: Secondary | ICD-10-CM | POA: Diagnosis not present

## 2016-11-26 DIAGNOSIS — D6869 Other thrombophilia: Secondary | ICD-10-CM | POA: Diagnosis not present

## 2016-11-26 DIAGNOSIS — C3490 Malignant neoplasm of unspecified part of unspecified bronchus or lung: Secondary | ICD-10-CM | POA: Diagnosis not present

## 2016-11-26 DIAGNOSIS — K219 Gastro-esophageal reflux disease without esophagitis: Secondary | ICD-10-CM | POA: Diagnosis not present

## 2016-11-26 DIAGNOSIS — C7951 Secondary malignant neoplasm of bone: Secondary | ICD-10-CM | POA: Diagnosis not present

## 2016-11-27 DIAGNOSIS — C7951 Secondary malignant neoplasm of bone: Secondary | ICD-10-CM | POA: Diagnosis not present

## 2016-11-27 DIAGNOSIS — C7880 Secondary malignant neoplasm of unspecified digestive organ: Secondary | ICD-10-CM | POA: Diagnosis not present

## 2016-11-27 DIAGNOSIS — D6869 Other thrombophilia: Secondary | ICD-10-CM | POA: Diagnosis not present

## 2016-11-27 DIAGNOSIS — C3490 Malignant neoplasm of unspecified part of unspecified bronchus or lung: Secondary | ICD-10-CM | POA: Diagnosis not present

## 2016-11-27 DIAGNOSIS — K219 Gastro-esophageal reflux disease without esophagitis: Secondary | ICD-10-CM | POA: Diagnosis not present

## 2016-11-30 ENCOUNTER — Telehealth: Payer: Self-pay | Admitting: *Deleted

## 2016-11-30 NOTE — Telephone Encounter (Signed)
Returned call after receipt of voicemail.  "I am for a scan on January 4 th and will see Dr. Irene Limbo on January 5 th.  Could you ask him if I can have a total body scan.  I'm having changes.  I fell about a month ago.  I have soreness in my hips.  Xrays at the time showed bruises.  Pain comes and goes.  I use Ibuprofen.  Chest and stomach pain is the same, comes and goes.  Now since the fall, I have these quick pain, really sore more than anything else."  Denies seeing PCP for this fall.  Will notify Dr. Irene Limbo who patient is aware returns to office 12-06-2016.[name]

## 2016-12-01 NOTE — Progress Notes (Signed)
Cindy Byrd    HEMATOLOGY ONCOLOGY PROGRESS NOTE  Date of service: .10/20/2016  Patient Care Team: Tamsen Roers, MD as PCP - General (Family Medicine) [name], MD as Consulting Physician (Hematology and Oncology)  CHIEF COMPLAINTS/PURPOSE OF CONSULTATION: Follow-up for metastatic lung cancer  DIAGNOSIS:   #1 Metastatic non-small cell lung cancer with bilateral lung nodules and large metastatic lesion in the left Ilium. #2 Barrett's esophagus with some evidence of intramucosal adenocarcinoma of the esophagus. 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  Previous Treatment  1 Palliative radiation therapy to the large left ilium metastases 2. IV Nivolumab x 20ycles (discontinued due to likely immune colitis) 3. Xgeva 1220mSC q4weeks for bone metastases.   HISTORY OF PRESENTING ILLNESS: (plz see my previous consultation for details of initial presentation)  INTERVAL HISTORY  Cindy Byrd here for her scheduled followup. She was noted to have C diff colitis on her stool studies and started on PO vancomycin. Seen by GI. Her diarrhea is much improved. Notes grade 2 fatigue. Encourage to eat better and drink more fluids.  MEDICAL HISTORY:  Past Medical History:  Diagnosis Date  . Barrett's esophagus   . Bone neoplasm 06/24/2015  [facility]   metastatic poorly differentiated carcinoma. tumor left groin surgical removal with radiation tx.  . Cataract    BILATERAL  . Cigarette smoker two packs a day or less    Currently still smoking 2 PPD - Not interested in quitting at this time.  . Colitis 2017  . Colon polyps    hyperplastic, tubular adenomas, tubulovillous adenoma  . Cough, persistent    hx. lung cancer ? primary-being evaluated, unsure of primary site.  . Depression 06/24/2015  .  Diverticulosis   . Emphysema of lung ([facility]  . Endometriosis    Hysterectomy with BSO at age [age]rs  . [place]ophageal [place] ([facility]   intramucosal  . Gastritis   . GERD (gastroesophageal reflux disease)   . H/O: pneumonia   . Hiatal hernia   . Hyperlipidemia   . Hypertension 06/24/2015   likely improved incidental to 40 lbs weight loss from her neoplasm. No Longer taking med for this as of 08-06-15  . IBS (irritable bowel syndrome)   . Pain    left hip-persistent"tumor of bone"-radiation tx. 10.  . Vitamin D deficiency disease    SURGICAL HISTORY: Past Surgical History:  Procedure Laterality Date  . BARTHOLIN GLAND CYST EXCISION  5275o ago   Does not want if it was an infected cyst or tumor. Was soon as delivery  . COLONOSCOPY W/ POLYPECTOMY     multiple times - last done 09/2014 per patient.  . ESOPHAGOGASTRODUODENOSCOPY (EGD) WITH PROPOFOL N/A 08/11/2015   Procedure: ESOPHAGOGASTRODUODENOSCOPY (EGD) WITH PROPOFOL;  Surgeon: JaJerene BearsMD;  Location: [facility];  Service: Gastroenterology;  Laterality: N/A;  . GANGLION CYST EXCISION    . KNEE ARTHROSCOPY  age about [age]rs  . TONSILLECTOMY    . TOTAL ABDOMINAL HYSTERECTOMY W/ BILATERAL SALPINGOOPHORECTOMY  at age [age]rs   For endometriosis    SOCIAL HISTORY: Social History   Social History  . Marital status: Widowed    Spouse name: N/A  . Number of children: 2  . Years of education: N/A   Occupational History  . Not on file.  Social History Main Topics  . Smoking status: Former Smoker    Packs/day: 1.00    Years: 60.00    Types: Cigarettes    Quit date: 12/06/2015  . Smokeless tobacco: Never Used  . Alcohol use No  . Drug use: No  . Sexual activity: No   Other Topics Concern  . Not on file   Social History Narrative  . No narrative on file    FAMILY HISTORY: Family History  Problem Relation Age of Onset  . Colon cancer Brother   . Colon cancer Brother   . Stroke Mother   . Colon cancer  Father   . Breast cancer Daughter 70    ER/PR+ stage II    ALLERGIES:  is allergic to penicillins; remeron [mirtazapine]; and latex. patient wonders if she has a penicillin allergy but notes that she is uncertain about this.  MEDICATIONS:  Current Outpatient Prescriptions  Medication Sig Dispense Refill  . budesonide (ENTOCORT EC) 3 MG 24 hr capsule Take 3 capsules (9 mg total) by mouth daily. 90 capsule 0  . calcium-vitamin D (OSCAL WITH D) 500-200 MG-UNIT tablet Take 2 tablets by mouth 3 (three) times daily.    . cholestyramine (QUESTRAN) 4 g packet TAKE 1 PACKET (4 G TOTAL) BY MOUTH 2 (TWO) TIMES DAILY. 60 packet 0  . citalopram (CELEXA) 20 MG tablet Take 1 tablet (20 mg total) by mouth daily. 30 tablet 0  . clotrimazole-betamethasone (LOTRISONE) cream Apply 1 application topically 2 (two) times daily. Apply to perianal skin twice daily. 30 g 1  . diphenoxylate-atropine (LOMOTIL) 2.5-0.025 MG tablet Take 1-2 tablets by mouth 4 (four) times daily as needed for diarrhea or loose stools. 60 tablet 0  . enoxaparin (LOVENOX) 60 MG/0.6ML injection INJECT 0.6 MLS INTO THE SKIN EVERY 12 HOURS. 36 Syringe 0  . furosemide (LASIX) 20 MG tablet Take 1 tablet (20 mg total) by mouth 2 (two) times daily. 60 tablet 0  . lidocaine-prilocaine (EMLA) cream Apply small amount over port 1-2 hours prior to treatment, cover with plastic wrap (DO NOT RUB IN). 30 g prn  . liver oil-zinc oxide (DESITIN) 40 % ointment Apply topically daily. 56.7 g 0  . LORazepam (ATIVAN) 1 MG tablet Take 1 tablet (1 mg total) by mouth every 8 (eight) hours as needed for anxiety (or nausea). 15 tablet 0  . magnesium oxide (MAG-OX) 400 (241.3 Mg) MG tablet Take 1 tablet (400 mg total) by mouth 2 (two) times daily. 60 tablet 0  . mesalamine (LIALDA) 1.2 g EC tablet Take 2 tablets (2.4 g total) by mouth daily with breakfast. 30 tablet 0  . omeprazole (PRILOSEC) 20 MG capsule Take 1 capsule (20 mg total) by mouth daily. 30 capsule 0  .  omeprazole (PRILOSEC) 40 MG capsule TAKE 1 CAPSULE BY MOUTH EVERY DAY**NEEDS OFFICE VISIT FOR FUTHER REFILL** 90 capsule 0  . ondansetron (ZOFRAN) 8 MG tablet Take 1 tablet (8 mg total) by mouth every 8 (eight) hours as needed for nausea or vomiting. 90 tablet 0  . oxyCODONE-acetaminophen (PERCOCET/ROXICET) 5-325 MG tablet Take 1 tablet by mouth every 4 (four) hours as needed for severe pain. 15 tablet 0  . potassium chloride SA (K-DUR,KLOR-CON) 20 MEQ tablet Take 40 meq twice daily for 5 days then 40 meq once daily 60 tablet 0  . PRESCRIPTION MEDICATION Antibody Plan [facility]    . saccharomyces boulardii (FLORASTOR) 250 MG capsule Take 1 capsule (250 mg total) by mouth 2 (two) times daily. [place]  capsule 0  . valsartan (DIOVAN) 80 MG tablet Take 0.5 tablets (40 mg total) by mouth daily. 30 tablet 0  . vancomycin (VANCOCIN HCL) 125 MG capsule Take 1 capsule (125 mg total) by mouth 4 (four) times daily. 40 capsule 0  . VITAMIN D, ERGOCALCIFEROL, PO Give 0.51m (4,000 units) by mouth once daily    . zolpidem (AMBIEN CR) 12.5 MG CR tablet Take 1 tablet (12.5 mg total) by mouth at bedtime as needed for sleep. 30 tablet 0   No current facility-administered medications for this visit.     REVIEW OF SYSTEMS:    10 point review of systems done and was noted to be negative except as noted above.  PHYSICAL EXAMINATION: ECOG PERFORMANCE STATUS: 2 - Symptomatic, <50% confined to bed  There were no vitals filed for this visit. There were no vitals filed for this visit.. Wt Readings from Last 3 Encounters:  10/20/16 121 lb 6.4 oz (55.1 kg)  10/12/16 121 lb (54.9 kg)  10/05/16 123 lb 6.4 oz (56 kg)   GENERAL:alert, NAD, appears well. SKIN: skin color, texture, turgor are normal, no rashes or significant lesions EYES: normal, conjunctiva are pink and non-injected, sclera clear OROPHARYNX:no exudate, no erythema and lips, buccal mucosa, and tongue normal  NECK: supple, thyroid normal size, non-tender, without  nodularity LYMPH:  no palpable lymphadenopathy in the cervical, axillary or inguinal LUNGS: clear to auscultation and bilateral reduced breath sounds with normal respiratory effort  HEART: regular rate & rhythm and no murmurs and resolved lower extremity edema ABDOMEN:abdomen soft, non-tender and normal bowel sounds Musculoskeletal: No pedal edema. No calf pain or tenderness.  PSYCH: alert & oriented x 3 with fluent speech NEURO: no focal motor/sensory deficits.  LABORATORY DATA:  I have reviewed the data as listed . CBC Latest Ref Rng & Units 10/20/2016 10/12/2016 10/04/2016  WBC 3.9 - 10.3 10e3/uL 7.9 8.2 10.7(H)  Hemoglobin 11.6 - 15.9 g/dL 10.4(L) 11.4(L) 10.0(L)  Hematocrit 34.8 - 46.6 % 31.3(L) 34.1(L) 31.2(L)  Platelets 145 - 400 10e3/uL 216 399.0 357   . CMP Latest Ref Rng & Units 10/20/2016 10/12/2016 10/04/2016  Glucose 70 - 140 mg/dl 131 118(H) 124  BUN 7.0 - 26.0 mg/dL 6.2(L) 12 13.1  Creatinine 0.6 - 1.1 mg/dL 0.8 0.97 1.1  Sodium 136 - 145 mEq/L 138 139 137  Potassium 3.5 - 5.1 mEq/L 2.9(LL) 3.8 2.8(LL)  Chloride 96 - 112 mEq/L - 110 -  CO2 22 - 29 mEq/L 17(L) 23 17(L)  Calcium 8.4 - 10.4 mg/dL 6.3(LL) 6.8(L) 6.7(L)  Total Protein 6.4 - 8.3 g/dL 5.8(L) 6.4 6.1(L)  Total Bilirubin 0.20 - 1.20 mg/dL 0.57 0.4 0.35  Alkaline Phos 40 - 150 U/L 170(H) 120(H) 102  AST 5 - 34 U/L 44(H) 52(H) 22  ALT 0 - 55 U/L 34 35 16     RADIOGRAPHIC STUDIES: I have personally reviewed the radiological images as listed and agreed with the findings in the report. Dg Ribs Bilateral W/chest  Result Date: 11/03/2016 CLINICAL DATA:  Recent fall with left lower rib pain, history of lung carcinoma with metastasis EXAM: BILATERAL RIBS AND CHEST - 4+ VIEW COMPARISON:  Chest CT of 10/03/2016 and chest x-ray of 09/01/2016 FINDINGS: Mild linear atelectasis or scarring is present at the lung bases. No pneumonia or effusion is seen. No pneumothorax is noted. Right-sided Port-A-Cath is present with  the tip seen to the mid SVC. The heart is within normal limits in size pre Left rib detail films distal  apparent fractures of the very anterior left seventh and eighth ribs. IMPRESSION: 1. Nondisplaced fractures of the anterior left seventh and eighth ribs. 2. Mild bibasilar linear atelectasis or scarring. Electronically Signed   By: Ivar Drape M.D.   On: 11/03/2016 16:43    ASSESSMENT & PLAN:   #1 Metastatic poorly differentiated carcinoma with likely lung primary [non-small cell lung cancer].  CT of the head with and without contrast showed no evidence of metastatic disease. Patient notes much improved pain control. EGFR blood test mutation analysis negative. Patient's pain is much better controlled after radiation for the painful ilium met. CT chest abdomen pelvis 04/19/2016 shows no evidence of disease progression. Patient tolerated Nivolumab very well until recently when she developed grade 2 Immune colitis as a result of which Nivolumab has been discontinued. CT chest abdomen pelvis on 06/24/2016 shows no evidence of new disease or progression of metastatic disease. CT chest abdomen pelvis 09/06/2016 shows 1. Mixed interval response to therapy. 2. There is a new left ventral chest wall lesion deep to the pectoralis musculature worrisome for metastatic disease. 3. Posterior lower lobe nodular densities are identified which may reflect areas of pulmonary metastasis. 4. Interval decrease in size of destructive lesion involving the left iliac bone. #2 diarrhea- grade 2 likely related to immune colitis from her Nivolumab now with c diff colitis (on vancomycin) She is currently on Lialda, budesonide, cholestyramine, probiotics and lomotil (not using her lomotil much)  #3 hypokalemia due to diarrhea - K his by Dr. 2.8 today and patient appears somewhat dehydrated . Declined IVF and IV Potassium Plan -appreciate GI input and mx of diarrhea -getting po vancomycin for newly diagnosed C diff  colitis -continue Addition po fluids. --Continue on Lialda, budesonide, cholestyramine, probiotics and lomotil (not using her lomotil much) -continue LA Octreotide -recommended close follow-up with gastroenterology for additional treatment options. -Off Lasix in the setting of dehydration and hypokalemia -rpt   #4 DVT and PE will need to be on ongoing Lovenox therapy  #5 Barrett's esophagus 4cms in the distal esophagus with low and high-grade dysplasia cannot rule out an early intra-mucosal esophageal adenocarcinoma. Plan -Patient being monitored by Dr. [name] from GI. -Patient was seen at [facility] for further management and saw Dr.Kochar, Arie Sabina, MD and Dr Adria Devon.  continue Sandostatin q4weeks  -Continue Xgeva every 4 weeks -rpt labs in 2 weeks on 11/01/2016 when patient comes for her sandostatin  -Return to clinic with Dr. Irene Limbo in 4 weeks with repeat labs and CT C/A/P  All questions were answered. The patient knows to call the clinic with any problems, questions or concerns.  Sullivan Lone MD [place] Hematology/Oncology Physician [facility]  (Office): [phone] (Work cell): [phone] (Fax): [phone]

## 2016-12-02 DIAGNOSIS — K219 Gastro-esophageal reflux disease without esophagitis: Secondary | ICD-10-CM | POA: Diagnosis not present

## 2016-12-02 DIAGNOSIS — C7951 Secondary malignant neoplasm of bone: Secondary | ICD-10-CM | POA: Diagnosis not present

## 2016-12-02 DIAGNOSIS — C7880 Secondary malignant neoplasm of unspecified digestive organ: Secondary | ICD-10-CM | POA: Diagnosis not present

## 2016-12-02 DIAGNOSIS — C3490 Malignant neoplasm of unspecified part of unspecified bronchus or lung: Secondary | ICD-10-CM | POA: Diagnosis not present

## 2016-12-02 DIAGNOSIS — D6869 Other thrombophilia: Secondary | ICD-10-CM | POA: Diagnosis not present

## 2016-12-05 DIAGNOSIS — C3490 Malignant neoplasm of unspecified part of unspecified bronchus or lung: Secondary | ICD-10-CM | POA: Diagnosis not present

## 2016-12-05 DIAGNOSIS — C7880 Secondary malignant neoplasm of unspecified digestive organ: Secondary | ICD-10-CM | POA: Diagnosis not present

## 2016-12-05 DIAGNOSIS — D6869 Other thrombophilia: Secondary | ICD-10-CM | POA: Diagnosis not present

## 2016-12-05 DIAGNOSIS — K219 Gastro-esophageal reflux disease without esophagitis: Secondary | ICD-10-CM | POA: Diagnosis not present

## 2016-12-05 DIAGNOSIS — C7951 Secondary malignant neoplasm of bone: Secondary | ICD-10-CM | POA: Diagnosis not present

## 2016-12-06 DIAGNOSIS — K219 Gastro-esophageal reflux disease without esophagitis: Secondary | ICD-10-CM | POA: Diagnosis not present

## 2016-12-06 DIAGNOSIS — D6869 Other thrombophilia: Secondary | ICD-10-CM | POA: Diagnosis not present

## 2016-12-06 DIAGNOSIS — C7951 Secondary malignant neoplasm of bone: Secondary | ICD-10-CM | POA: Diagnosis not present

## 2016-12-06 DIAGNOSIS — C7880 Secondary malignant neoplasm of unspecified digestive organ: Secondary | ICD-10-CM | POA: Diagnosis not present

## 2016-12-06 DIAGNOSIS — C3490 Malignant neoplasm of unspecified part of unspecified bronchus or lung: Secondary | ICD-10-CM | POA: Diagnosis not present

## 2016-12-08 ENCOUNTER — Other Ambulatory Visit (HOSPITAL_BASED_OUTPATIENT_CLINIC_OR_DEPARTMENT_OTHER)

## 2016-12-08 ENCOUNTER — Other Ambulatory Visit (HOSPITAL_COMMUNITY)
Admission: RE | Admit: 2016-12-08 | Discharge: 2016-12-08 | Disposition: A | Discharge status: Home / Self Care | Source: Ambulatory Visit | Attending: Hematology | Admitting: Hematology

## 2016-12-08 ENCOUNTER — Encounter (HOSPITAL_COMMUNITY): Payer: Self-pay

## 2016-12-08 ENCOUNTER — Ambulatory Visit (HOSPITAL_COMMUNITY)
Admission: RE | Admit: 2016-12-08 | Discharge: 2016-12-08 | Disposition: A | Discharge status: Home / Self Care | Source: Ambulatory Visit | Attending: Hematology | Admitting: Hematology

## 2016-12-08 DIAGNOSIS — I2782 Chronic pulmonary embolism: Secondary | ICD-10-CM | POA: Insufficient documentation

## 2016-12-08 DIAGNOSIS — C3491 Malignant neoplasm of unspecified part of right bronchus or lung: Secondary | ICD-10-CM | POA: Insufficient documentation

## 2016-12-08 DIAGNOSIS — C3490 Malignant neoplasm of unspecified part of unspecified bronchus or lung: Secondary | ICD-10-CM | POA: Diagnosis not present

## 2016-12-08 DIAGNOSIS — J90 Pleural effusion, not elsewhere classified: Secondary | ICD-10-CM | POA: Insufficient documentation

## 2016-12-08 DIAGNOSIS — K219 Gastro-esophageal reflux disease without esophagitis: Secondary | ICD-10-CM | POA: Diagnosis not present

## 2016-12-08 DIAGNOSIS — C7880 Secondary malignant neoplasm of unspecified digestive organ: Secondary | ICD-10-CM | POA: Diagnosis not present

## 2016-12-08 DIAGNOSIS — D6869 Other thrombophilia: Secondary | ICD-10-CM | POA: Diagnosis not present

## 2016-12-08 DIAGNOSIS — R197 Diarrhea, unspecified: Secondary | ICD-10-CM

## 2016-12-08 DIAGNOSIS — C7951 Secondary malignant neoplasm of bone: Secondary | ICD-10-CM | POA: Diagnosis not present

## 2016-12-08 LAB — CBC & DIFF AND RETIC
BASO%: 0.7 % (ref 0.0–2.0)
BASOS ABS: 0 10*3/uL (ref 0.0–0.1)
EOS ABS: 0.1 10*3/uL (ref 0.0–0.5)
EOS%: 1 % (ref 0.0–7.0)
HEMATOCRIT: 28.1 % — AB (ref 34.8–46.6)
HEMOGLOBIN: 8.8 g/dL — AB (ref 11.6–15.9)
Immature Retic Fract: 17.9 % — ABNORMAL HIGH (ref 1.60–10.00)
LYMPH#: 1 10*3/uL (ref 0.9–3.3)
LYMPH%: 17.4 % (ref 14.0–49.7)
MCH: 30.8 pg (ref 25.1–34.0)
MCHC: 31.3 g/dL — AB (ref 31.5–36.0)
MCV: 98.3 fL (ref 79.5–101.0)
MONO#: 0.4 10*3/uL (ref 0.1–0.9)
MONO%: 7 % (ref 0.0–14.0)
NEUT#: 4.3 10*3/uL (ref 1.5–6.5)
NEUT%: 73.9 % (ref 38.4–76.8)
Platelets: 368 10*3/uL (ref 145–400)
RBC: 2.86 10*6/uL — ABNORMAL LOW (ref 3.70–5.45)
RDW: 17.8 % — AB (ref 11.2–14.5)
RETIC %: 3.81 % — AB (ref 0.70–2.10)
RETIC CT ABS: 108.97 10*3/uL — AB (ref 33.70–90.70)
WBC: 5.9 10*3/uL (ref 3.9–10.3)

## 2016-12-08 LAB — COMPREHENSIVE METABOLIC PANEL
ALT: 11 U/L (ref 0–55)
AST: 16 U/L (ref 5–34)
Albumin: 2.1 g/dL — ABNORMAL LOW (ref 3.5–5.0)
Alkaline Phosphatase: 122 U/L (ref 40–150)
Anion Gap: 8 mEq/L (ref 3–11)
BUN: 13 mg/dL (ref 7.0–26.0)
CHLORIDE: 117 meq/L — AB (ref 98–109)
CO2: 15 meq/L — AB (ref 22–29)
Calcium: 7.9 mg/dL — ABNORMAL LOW (ref 8.4–10.4)
Creatinine: 0.8 mg/dL (ref 0.6–1.1)
EGFR: 69 mL/min/{1.73_m2} — AB (ref >90)
Glucose: 90 mg/dl (ref 70–140)
Potassium: 5.5 mEq/L — ABNORMAL HIGH (ref 3.5–5.1)
Sodium: 139 mEq/L (ref 136–145)
Total Bilirubin: 0.23 mg/dL (ref 0.20–1.20)
Total Protein: 6.7 g/dL (ref 6.4–8.3)

## 2016-12-08 LAB — PHOSPHORUS: Phosphorus: 2.5 mg/dL (ref 2.5–4.6)

## 2016-12-08 LAB — MAGNESIUM: MAGNESIUM: 1.2 mg/dL — AB (ref 1.5–2.5)

## 2016-12-08 MED ORDER — IOPAMIDOL (ISOVUE-300) INJECTION 61%
INTRAVENOUS | Status: AC
  Administered 2016-12-08: 100 mL
  Filled 2016-12-08: qty 100

## 2016-12-09 ENCOUNTER — Telehealth: Payer: Self-pay | Admitting: Hematology

## 2016-12-09 ENCOUNTER — Ambulatory Visit (HOSPITAL_BASED_OUTPATIENT_CLINIC_OR_DEPARTMENT_OTHER): Admitting: Hematology

## 2016-12-09 ENCOUNTER — Encounter: Payer: Self-pay | Admitting: Hematology

## 2016-12-09 ENCOUNTER — Ambulatory Visit: Payer: Medicare Other

## 2016-12-09 ENCOUNTER — Ambulatory Visit (HOSPITAL_BASED_OUTPATIENT_CLINIC_OR_DEPARTMENT_OTHER)

## 2016-12-09 ENCOUNTER — Encounter: Payer: Self-pay | Admitting: Radiation Oncology

## 2016-12-09 ENCOUNTER — Other Ambulatory Visit: Payer: Medicare Other

## 2016-12-09 VITALS — BP 111/65 | HR 108 | Temp 97.6°F | Resp 18 | Ht 67.75 in | Wt 105.6 lb

## 2016-12-09 DIAGNOSIS — Z86718 Personal history of other venous thrombosis and embolism: Secondary | ICD-10-CM

## 2016-12-09 DIAGNOSIS — C3490 Malignant neoplasm of unspecified part of unspecified bronchus or lung: Secondary | ICD-10-CM | POA: Diagnosis not present

## 2016-12-09 DIAGNOSIS — R197 Diarrhea, unspecified: Secondary | ICD-10-CM | POA: Diagnosis not present

## 2016-12-09 DIAGNOSIS — K219 Gastro-esophageal reflux disease without esophagitis: Secondary | ICD-10-CM | POA: Diagnosis not present

## 2016-12-09 DIAGNOSIS — E872 Acidosis, unspecified: Secondary | ICD-10-CM

## 2016-12-09 DIAGNOSIS — Z86711 Personal history of pulmonary embolism: Secondary | ICD-10-CM

## 2016-12-09 DIAGNOSIS — I2699 Other pulmonary embolism without acute cor pulmonale: Secondary | ICD-10-CM

## 2016-12-09 DIAGNOSIS — C7951 Secondary malignant neoplasm of bone: Secondary | ICD-10-CM

## 2016-12-09 DIAGNOSIS — Z95828 Presence of other vascular implants and grafts: Secondary | ICD-10-CM

## 2016-12-09 DIAGNOSIS — C3491 Malignant neoplasm of unspecified part of right bronchus or lung: Secondary | ICD-10-CM

## 2016-12-09 DIAGNOSIS — E875 Hyperkalemia: Secondary | ICD-10-CM

## 2016-12-09 DIAGNOSIS — C7880 Secondary malignant neoplasm of unspecified digestive organ: Secondary | ICD-10-CM | POA: Diagnosis not present

## 2016-12-09 DIAGNOSIS — D6869 Other thrombophilia: Secondary | ICD-10-CM | POA: Diagnosis not present

## 2016-12-09 DIAGNOSIS — G893 Neoplasm related pain (acute) (chronic): Secondary | ICD-10-CM

## 2016-12-09 DIAGNOSIS — Z7901 Long term (current) use of anticoagulants: Secondary | ICD-10-CM

## 2016-12-09 MED ORDER — SODIUM BICARBONATE 650 MG PO TABS: 650.0000 mg | ORAL_TABLET | Freq: Four times a day (QID) | ORAL | 0 refills | Status: DC

## 2016-12-09 MED ORDER — ENOXAPARIN SODIUM 80 MG/0.8ML ~~LOC~~ SOLN: 1.5000 mg/kg | SUBCUTANEOUS | 1 refills | Status: DC

## 2016-12-09 MED ORDER — OCTREOTIDE ACETATE 30 MG IM KIT
30.0000 mg | PACK | Freq: Once | INTRAMUSCULAR | Status: AC
  Administered 2016-12-09: 30 mg via INTRAMUSCULAR
  Filled 2016-12-09: qty 1

## 2016-12-09 MED ORDER — DENOSUMAB 120 MG/1.7ML ~~LOC~~ SOLN
120.0000 mg | Freq: Once | SUBCUTANEOUS | Status: AC
  Administered 2016-12-09: 120 mg via SUBCUTANEOUS
  Filled 2016-12-09: qty 1.7

## 2016-12-09 MED ORDER — MAGNESIUM OXIDE 400 (241.3 MG) MG PO TABS: 400.0000 mg | ORAL_TABLET | Freq: Two times a day (BID) | ORAL | 0 refills | Status: DC

## 2016-12-09 MED ORDER — HEPARIN SOD (PORK) LOCK FLUSH 100 UNIT/ML IV SOLN
500.0000 [IU] | INTRAVENOUS | Status: DC | PRN
  Filled 2016-12-09: qty 5

## 2016-12-09 MED ORDER — OXYCODONE-ACETAMINOPHEN 5-325 MG PO TABS: 1.0000 | ORAL_TABLET | ORAL | 0 refills | Status: DC | PRN

## 2016-12-09 MED ORDER — SODIUM CHLORIDE 0.9 % IJ SOLN
10.0000 mL | INTRAMUSCULAR | Status: DC | PRN
  Filled 2016-12-09: qty 10

## 2016-12-09 NOTE — Telephone Encounter (Signed)
Rad/Onc appointment scheduled for next available 12/26/16 with Dr Tammi Klippel. Labs and follow up with Dr Irene Limbo, scheduled for 12/27/16. Labs,flush and  injections scheduled per 12/09/16 los. Patient was given a copy of the AVS report and appointment schedule, per 12/09/16 los.

## 2016-12-09 NOTE — Patient Instructions (Signed)
-  stop potassium replacement at this time -sodium bicarbonate tab 1 po twice daily for 2 weeks -start magnesium replacement -lovenox injection dose adjusted --new prescription sent to your pharmacy -referral given for CT guided biopsy of left pleural mass -- hold lovenox dose the day before biopsy and restart the day after if no bleeding issues -referral given to radiation oncology to consider palliative RT to left lung mass

## 2016-12-09 NOTE — Progress Notes (Signed)
Labs were drawn during pt CT Scan on 12/08/16. Port did not get accessed.

## 2016-12-12 ENCOUNTER — Telehealth: Payer: Self-pay | Admitting: Hematology

## 2016-12-12 DIAGNOSIS — K219 Gastro-esophageal reflux disease without esophagitis: Secondary | ICD-10-CM | POA: Diagnosis not present

## 2016-12-12 DIAGNOSIS — C3490 Malignant neoplasm of unspecified part of unspecified bronchus or lung: Secondary | ICD-10-CM | POA: Diagnosis not present

## 2016-12-12 DIAGNOSIS — C7880 Secondary malignant neoplasm of unspecified digestive organ: Secondary | ICD-10-CM | POA: Diagnosis not present

## 2016-12-12 DIAGNOSIS — C7951 Secondary malignant neoplasm of bone: Secondary | ICD-10-CM | POA: Diagnosis not present

## 2016-12-12 DIAGNOSIS — D6869 Other thrombophilia: Secondary | ICD-10-CM | POA: Diagnosis not present

## 2016-12-12 NOTE — Progress Notes (Signed)
Cindy Byrd    HEMATOLOGY ONCOLOGY PROGRESS NOTE  Date of service: .12/09/2016  Patient Care Team: Tamsen Roers, MD as PCP - General (Family Medicine) [name], MD as Consulting Physician (Hematology and Oncology)  CHIEF COMPLAINTS/PURPOSE OF CONSULTATION: Follow-up for metastatic lung cancer  DIAGNOSIS:   #1 Metastatic non-small cell lung cancer with bilateral lung nodules and large metastatic lesion in the left Ilium. #2 Barrett's esophagus with some evidence of intramucosal adenocarcinoma of the esophagus. 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  Previous Treatment  1 Palliative radiation therapy to the large left ilium metastases 2. IV Nivolumab x 20ycles (discontinued due to likely immune colitis) 3. Xgeva 1249m[place] q4weeks for bone metastases.   HISTORY OF PRESENTING ILLNESS: (plz see my previous consultation for details of initial presentation)  INTERVAL HISTORY  Cindy Byrd here for her scheduled followup. She notes her diarrhea is much better controlled and that she only has 1-2 bowel movements a day. Fevers or chills. Borderline food intake but has been trying to eat better. He has had a few falls and has left-sided chest wall pain. Had a follow-up CT chest abdomen pelvis  Which shows Cystic mass involving the left ventral chest wall has resolved in the interval. Likely was a hematoma due to trauma. Interval increase in size of pleural base mass overlying the posterior and inferior left lower lobe. There is also a new left pleural effusion identified.  MEDICAL HISTORY:  Past Medical History:  Diagnosis Date  . Barrett's esophagus   . Bone neoplasm 06/24/2015  [facility]   metastatic poorly differentiated carcinoma. tumor left groin surgical removal with radiation tx.  .  Cataract    BILATERAL  . Cigarette smoker two packs a day or less    Currently still smoking 2 PPD - Not interested in quitting at this time.  . Colitis 2017  . Colon polyps    hyperplastic, tubular adenomas, tubulovillous adenoma  . Cough, persistent    hx. lung cancer ? primary-being evaluated, unsure of primary site.  . Depression 06/24/2015  . Diverticulosis   . [place] of lung [place]  . Endometriosis    Hysterectomy with BSO at age [age]rs  . Esophageal adenocarcinoma ([facility]9/6/16   intramucosal  . Gastritis   . GERD (gastroesophageal reflux disease)   . H/O: pneumonia   . Hiatal hernia   . Hyperlipidemia   . Hypertension 06/24/2015   likely improved incidental to 40 lbs weight loss from her neoplasm. No Longer taking med for this as of 08-06-15  . IBS (irritable bowel syndrome)   . Pain    left hip-persistent"tumor of bone"-radiation tx. 10.  . Vitamin D deficiency disease    SURGICAL HISTORY: Past Surgical History:  Procedure Laterality Date  . BARTHOLIN GLAND CYST EXCISION  5262o ago   Does not want if it was an infected cyst or tumor. Was soon as delivery  . COLONOSCOPY W/ POLYPECTOMY     multiple times - last done 09/2014 per patient.  . ESOPHAGOGASTRODUODENOSCOPY (EGD) WITH PROPOFOL N/A 08/11/2015   Procedure: ESOPHAGOGASTRODUODENOSCOPY (EGD) WITH PROPOFOL;  Surgeon: JaJerene BearsMD;  Location: [facility];  Service: Gastroenterology;  Laterality: N/A;  . GANGLION CYST EXCISION    . KNEE ARTHROSCOPY  age about [age]rs  . TONSILLECTOMY    .  TOTAL ABDOMINAL HYSTERECTOMY W/ BILATERAL SALPINGOOPHORECTOMY  at age 50 yrs   For endometriosis    SOCIAL HISTORY: Social History   Social History  . Marital status: Widowed    Spouse name: N/A  . Number of children: 2  . Years of education: N/A   Occupational History  . Not on file.   Social History Main Topics  . Smoking status: Former Smoker    Packs/day: 1.00    Years: 60.00    Types: Cigarettes    Quit date:  12/06/2015  . Smokeless tobacco: Never Used  . Alcohol use No  . Drug use: No  . Sexual activity: No   Other Topics Concern  . Not on file   Social History Narrative  . No narrative on file    FAMILY HISTORY: Family History  Problem Relation Age of Onset  . Colon cancer Brother   . Colon cancer Brother   . Stroke Mother   . Colon cancer Father   . Breast cancer Daughter 22    ER/PR+ stage II    ALLERGIES:  is allergic to penicillins; remeron [mirtazapine]; and latex. patient wonders if she has a penicillin allergy but notes that she is uncertain about this.  MEDICATIONS:  Current Outpatient Prescriptions  Medication Sig Dispense Refill  . budesonide (ENTOCORT EC) 3 MG 24 hr capsule Take 3 capsules (9 mg total) by mouth daily. 90 capsule 0  . calcium-vitamin D (OSCAL WITH D) 500-200 MG-UNIT tablet Take 2 tablets by mouth 3 (three) times daily.    . cholestyramine (QUESTRAN) 4 g packet TAKE 1 PACKET (4 G TOTAL) BY MOUTH 2 (TWO) TIMES DAILY. 60 packet 0  . citalopram (CELEXA) 20 MG tablet Take 1 tablet (20 mg total) by mouth daily. 30 tablet 0  . diphenoxylate-atropine (LOMOTIL) 2.5-0.025 MG tablet Take 1-2 tablets by mouth 4 (four) times daily as needed for diarrhea or loose stools. 60 tablet 0  . enoxaparin (LOVENOX) 80 MG/0.8ML injection Inject 0.7 mLs (70 mg total) into the skin daily. 30 Syringe 1  . lidocaine-prilocaine (EMLA) cream Apply small amount over port 1-2 hours prior to treatment, cover with plastic wrap (DO NOT RUB IN). 30 g prn  . liver oil-zinc oxide (DESITIN) 40 % ointment Apply topically daily. 56.7 g 0  . LORazepam (ATIVAN) 1 MG tablet Take 1 tablet (1 mg total) by mouth every 8 (eight) hours as needed for anxiety (or nausea). 15 tablet 0  . magnesium oxide (MAG-OX) 400 (241.3 Mg) MG tablet Take 1 tablet (400 mg total) by mouth 2 (two) times daily. 60 tablet 0  . mesalamine (LIALDA) 1.2 g EC tablet Take 2 tablets (2.4 g total) by mouth daily with breakfast. 30  tablet 0  . omeprazole (PRILOSEC) 20 MG capsule Take 1 capsule (20 mg total) by mouth daily. 30 capsule 0  . omeprazole (PRILOSEC) 40 MG capsule TAKE 1 CAPSULE BY MOUTH EVERY DAY**NEEDS OFFICE VISIT FOR FUTHER REFILL** 90 capsule 0  . ondansetron (ZOFRAN) 8 MG tablet Take 1 tablet (8 mg total) by mouth every 8 (eight) hours as needed for nausea or vomiting. 90 tablet 0  . oxyCODONE-acetaminophen (PERCOCET/ROXICET) 5-325 MG tablet Take 1 tablet by mouth every 4 (four) hours as needed for severe pain. 15 tablet 0  . PRESCRIPTION MEDICATION Antibody Plan [facility]    . saccharomyces boulardii (FLORASTOR) 250 MG capsule Take 1 capsule (250 mg total) by mouth 2 (two) times daily. 60 capsule 0  . VITAMIN D, ERGOCALCIFEROL, PO  Give 0.60m (4,000 units) by mouth once daily    . zolpidem (AMBIEN CR) 12.5 MG CR tablet Take 1 tablet (12.5 mg total) by mouth at bedtime as needed for sleep. 30 tablet 0  . sodium bicarbonate 650 MG tablet Take 1 tablet (650 mg total) by mouth 4 (four) times daily. 30 tablet 0   No current facility-administered medications for this visit.     REVIEW OF SYSTEMS:    10 point review of systems done and was noted to be negative except as noted above.  PHYSICAL EXAMINATION: ECOG PERFORMANCE STATUS: 2 - Symptomatic, <50% confined to bed  Vitals:   12/09/16 1114  BP: 111/65  Pulse: (!) 108  Resp: 18  Temp: 97.6 F (36.4 C)   Filed Weights   12/09/16 1114  Weight: 105 lb 9.6 oz (47.9 kg)  . Wt Readings from Last 3 Encounters:  12/09/16 105 lb 9.6 oz (47.9 kg)  10/20/16 121 lb 6.4 oz (55.1 kg)  10/12/16 121 lb (54.9 kg)   GENERAL:alert, NAD, appears well. SKIN: skin color, texture, turgor are normal, no rashes or significant lesions EYES: normal, conjunctiva are pink and non-injected, sclera clear OROPHARYNX:no exudate, no erythema and lips, buccal mucosa, and tongue normal  NECK: supple, thyroid normal size, non-tender, without nodularity LYMPH:  no palpable  lymphadenopathy in the cervical, axillary or inguinal LUNGS: clear to auscultation and bilateral reduced breath sounds with normal respiratory effort  HEART: regular rate & rhythm and no murmurs and resolved lower extremity edema ABDOMEN:abdomen soft, non-tender and normal bowel sounds Musculoskeletal: No pedal edema. No calf pain or tenderness.  PSYCH: alert & oriented x 3 with fluent speech NEURO: no focal motor/sensory deficits.  LABORATORY DATA:  I have reviewed the data as listed . CBC Latest Ref Rng & Units 12/08/2016 10/20/2016 10/12/2016  WBC 3.9 - 10.3 10e3/uL 5.9 7.9 8.2  Hemoglobin 11.6 - 15.9 g/dL 8.8(L) 10.4(L) 11.4(L)  Hematocrit 34.8 - 46.6 % 28.1(L) 31.3(L) 34.1(L)  Platelets 145 - 400 10e3/uL 368 216 399.0   . CMP Latest Ref Rng & Units 12/08/2016 10/20/2016 10/12/2016  Glucose 70 - 140 mg/dl 90 131 118(H)  BUN 7.0 - 26.0 mg/dL 13.0 6.2(L) 12  Creatinine 0.6 - 1.1 mg/dL 0.8 0.8 0.97  Sodium 136 - 145 mEq/L 139 138 139  Potassium 3.5 - 5.1 mEq/L 5.5 No visable hemolysis(H) 2.9(LL) 3.8  Chloride 96 - 112 mEq/L - - 110  CO2 22 - 29 mEq/L 15(L) 17(L) 23  Calcium 8.4 - 10.4 mg/dL 7.9(L) 6.3(LL) 6.8(L)  Total Protein 6.4 - 8.3 g/dL 6.7 5.8(L) 6.4  Total Bilirubin 0.20 - 1.20 mg/dL 0.23 0.57 0.4  Alkaline Phos 40 - 150 U/L 122 170(H) 120(H)  AST 5 - 34 U/L 16 44(H) 52(H)  ALT 0 - 55 U/L 11 34 35     RADIOGRAPHIC STUDIES: I have personally reviewed the radiological images as listed and agreed with the findings in the report. Ct Chest W Contrast  Result Date: 12/08/2016 CLINICAL DATA:  Restaging lung cancer EXAM: CT CHEST, ABDOMEN, AND PELVIS WITH CONTRAST TECHNIQUE: Multidetector CT imaging of the chest, abdomen and pelvis was performed following the standard protocol during bolus administration of intravenous contrast. CONTRAST:  1 ISOVUE-300 IOPAMIDOL (ISOVUE-300) INJECTION 61% COMPARISON:  10/03/2016. FINDINGS: CT CHEST FINDINGS Cardiovascular: Normal heart size. No  pericardial effusion. Aortic atherosclerosis is noted. Evidence of chronic pulmonary embolus to the left lower lobe, image 74 of series 5. This is in the same vessel which was  affected by acute pulmonary embolus as described on 09/02/2016. The right lower lobe pulmonary embolus from 09/02/2016 has resolved in the interval. Mediastinum/Nodes: The trachea appears patent and is midline. Normal appearance of the esophagus. No enlarged mediastinal or hilar lymph nodes. Interval resolution of low-attenuation mass within the left chest wall deep to the pectoralis musculature. Lungs/Pleura: New small left pleural effusion. Moderate changes of centrilobular emphysema. The pleural base mass within the posterior left base measures 6.2 x 2.2 x 6.9 cm, image 58 of series 2 and image 137 of series 6. On the previous exam this measured 4.2 x 2.0 x 2.8 cm. Musculoskeletal: There are acute fracture deformities involving the posterior aspects of the tenth and eleventh left ribs. These appear posttraumatic, but given the close proximity to the pleural based tumor overlying the posterior left lower lobe pathologic fractures cannot be excluded. CT ABDOMEN PELVIS FINDINGS Hepatobiliary: Similar appearance of low-attenuation foci within the liver, unchanged from previous exam. Likely cysts. The gallbladder appears normal. Pancreas: Unremarkable. No pancreatic ductal dilatation or surrounding inflammatory changes. Spleen: Normal in size without focal abnormality. Adrenals/Urinary Tract: Adrenal glands are unremarkable. Kidneys are normal, without renal calculi, focal lesion, or hydronephrosis. Bladder is unremarkable. Stomach/Bowel: Stomach is within normal limits. Appendix appears normal. No evidence of bowel wall thickening, distention, or inflammatory changes. Vascular/Lymphatic: Aortic atherosclerosis No enlarged retroperitoneal or mesenteric adenopathy. No enlarged pelvic or inguinal lymph nodes. Reproductive: Status post  hysterectomy. No adnexal masses. Other: No abdominal wall hernia or abnormality. No abdominopelvic ascites. Musculoskeletal: There is large destructive bone lesion involving the left iliac wing measuring 10 cm, image 92 of series 2. Extensive fragmentation of the left iliac bone is identified. This appears unchanged from the previous exam. IMPRESSION: 1. Mixed interval response to therapy. 2. Interval increase in size of pleural base mass overlying the posterior and inferior left lower lobe. There is also a new left pleural effusion identified. 3. Cystic mass involving the left ventral chest wall has resolved in the interval. 4. Similar appearance of large destructive lesion involving the left iliac bone. 5. New fractures involving the posterior aspect of the left tenth and eleventh ribs. Favor posttraumatic. However, given close proximity to left pleural tumor cannot rule out pathologic fractures. 6. Chronic pulmonary embolus to the left lower lobe. Electronically Signed   By: Kerby Moors M.D.   On: 12/08/2016 10:57   Ct Abdomen Pelvis W Contrast  Result Date: 12/08/2016 CLINICAL DATA:  Restaging lung cancer EXAM: CT CHEST, ABDOMEN, AND PELVIS WITH CONTRAST TECHNIQUE: Multidetector CT imaging of the chest, abdomen and pelvis was performed following the standard protocol during bolus administration of intravenous contrast. CONTRAST:  1 ISOVUE-300 IOPAMIDOL (ISOVUE-300) INJECTION 61% COMPARISON:  10/03/2016. FINDINGS: CT CHEST FINDINGS Cardiovascular: Normal heart size. No pericardial effusion. Aortic atherosclerosis is noted. Evidence of chronic pulmonary embolus to the left lower lobe, image 74 of series 5. This is in the same vessel which was affected by acute pulmonary embolus as described on 09/02/2016. The right lower lobe pulmonary embolus from 09/02/2016 has resolved in the interval. Mediastinum/Nodes: The trachea appears patent and is midline. Normal appearance of the esophagus. No enlarged mediastinal  or hilar lymph nodes. Interval resolution of low-attenuation mass within the left chest wall deep to the pectoralis musculature. Lungs/Pleura: New small left pleural effusion. Moderate changes of centrilobular emphysema. The pleural base mass within the posterior left base measures 6.2 x 2.2 x 6.9 cm, image 58 of series 2 and image 137 of series 6. On  the previous exam this measured 4.2 x 2.0 x 2.8 cm. Musculoskeletal: There are acute fracture deformities involving the posterior aspects of the tenth and eleventh left ribs. These appear posttraumatic, but given the close proximity to the pleural based tumor overlying the posterior left lower lobe pathologic fractures cannot be excluded. CT ABDOMEN PELVIS FINDINGS Hepatobiliary: Similar appearance of low-attenuation foci within the liver, unchanged from previous exam. Likely cysts. The gallbladder appears normal. Pancreas: Unremarkable. No pancreatic ductal dilatation or surrounding inflammatory changes. Spleen: Normal in size without focal abnormality. Adrenals/Urinary Tract: Adrenal glands are unremarkable. Kidneys are normal, without renal calculi, focal lesion, or hydronephrosis. Bladder is unremarkable. Stomach/Bowel: Stomach is within normal limits. Appendix appears normal. No evidence of bowel wall thickening, distention, or inflammatory changes. Vascular/Lymphatic: Aortic atherosclerosis No enlarged retroperitoneal or mesenteric adenopathy. No enlarged pelvic or inguinal lymph nodes. Reproductive: Status post hysterectomy. No adnexal masses. Other: No abdominal wall hernia or abnormality. No abdominopelvic ascites. Musculoskeletal: There is large destructive bone lesion involving the left iliac wing measuring 10 cm, image 92 of series 2. Extensive fragmentation of the left iliac bone is identified. This appears unchanged from the previous exam. IMPRESSION: 1. Mixed interval response to therapy. 2. Interval increase in size of pleural base mass overlying the  posterior and inferior left lower lobe. There is also a new left pleural effusion identified. 3. Cystic mass involving the left ventral chest wall has resolved in the interval. 4. Similar appearance of large destructive lesion involving the left iliac bone. 5. New fractures involving the posterior aspect of the left tenth and eleventh ribs. Favor posttraumatic. However, given close proximity to left pleural tumor cannot rule out pathologic fractures. 6. Chronic pulmonary embolus to the left lower lobe. Electronically Signed   By: Kerby Moors M.D.   On: 12/08/2016 10:57    ASSESSMENT & PLAN:   #1 Metastatic poorly differentiated carcinoma with likely lung primary [non-small cell lung cancer].  CT of the head with and without contrast showed no evidence of metastatic disease. Patient notes much improved pain control. EGFR blood test mutation analysis negative. Patient's pain is much better controlled after radiation for the painful ilium met. CT chest abdomen pelvis 04/19/2016 shows no evidence of disease progression. Patient tolerated Nivolumab very well until recently when she developed grade 2 Immune colitis as a result of which Nivolumab has been discontinued. CT chest abdomen pelvis on 06/24/2016 shows no evidence of new disease or progression of metastatic disease. CT chest abdomen pelvis 09/06/2016 shows 1. Mixed interval response to therapy. 2. There is a new left ventral chest wall lesion deep to the pectoralis musculature worrisome for metastatic disease. 3. Posterior lower lobe nodular densities are identified which may reflect areas of pulmonary metastasis. 4. Interval decrease in size of destructive lesion involving the left iliac bone.  CT chest abd pelvis 12/08/2016: Cystic mass involving the left ventral chest wall has resolved in the interval. Likely was a hematoma due to trauma. Interval increase in size of pleural base mass overlying the posterior and inferior left lower lobe. There is  also a new left pleural effusion identified.  PLAN -CT scan reveals discussed in details with the patient. We discussed that the findings could suggest a traumatic hemothorax/hematoma versus new pleural tumor. -Evaluation for CT-guided biopsy was done by interventional radiology. I discussed the case with IR and they felt that this mass was not defined enough and they were not certain that this was a tumor and would recommend interval follow-up. -Given  a referral to radiation oncology to evaluate this pleural lesion to and if this is thought to represent a recurrent tumor to consider palliative RT since the patient is not currently a candidate for any palliative chemotherapy given her poor performance status . - we'll plan to repeat CT imaging would be 2 months . - patient counseled on fall precautions and encouraged to use her cane and walker as instructed . - she was recommended to focus on optimizing her nutritional status . -Goals of care were discussed again. Patient is currently enrolled in hospice but notes that she would want palliative treatments if she is a candidate for these/  #2 diarrhea-  now resolved was previouslygrade 2 likely related to immune colitis from her Nivolumab now with c diff colitis (s/p vancomycin) She is currently on Lialda, budesonide, cholestyramine, probiotics and lomotil (not using her lomotil much) Plan  -Diarrhea is much better controlled -Continue sandostatin -continue f/u with GI to see if her po steroids could be tapered and cholestyramine could be stopped.    #3 Hyperkalemia and acidosis. Plan -We'll discontinue potassium replacement . -Sodium bicarbonate 1 tablet by mouth twice a day weeks  -adequate by mouth fluid intake  -Repeat labs in 2 weeks   #4 DVT and PE  -will need to be on ongoing Lovenox therapy -Monitor for bleeding .  #5 Barrett's esophagus 4cms in the distal esophagus with low and high-grade dysplasia cannot rule out an early  intra-mucosal esophageal adenocarcinoma. Plan -Patient being monitored by Dr. [name] from GI.  Continue LA Octreotide and Marchelle Folks RTC with Dr Irene Limbo in 2 weeks with labs   All questions were answered. The patient knows to call the clinic with any problems, questions or concerns.  Sullivan Lone MD [place] [facility] Physician [facility]  (Office): [phone] (Work cell): [phone] (Fax): [phone]

## 2016-12-14 ENCOUNTER — Other Ambulatory Visit: Payer: Self-pay | Admitting: Nurse Practitioner

## 2016-12-15 DIAGNOSIS — C7880 Secondary malignant neoplasm of unspecified digestive organ: Secondary | ICD-10-CM | POA: Diagnosis not present

## 2016-12-15 DIAGNOSIS — K219 Gastro-esophageal reflux disease without esophagitis: Secondary | ICD-10-CM | POA: Diagnosis not present

## 2016-12-15 DIAGNOSIS — C3490 Malignant neoplasm of unspecified part of unspecified bronchus or lung: Secondary | ICD-10-CM | POA: Diagnosis not present

## 2016-12-15 DIAGNOSIS — C7951 Secondary malignant neoplasm of bone: Secondary | ICD-10-CM | POA: Diagnosis not present

## 2016-12-15 DIAGNOSIS — D6869 Other thrombophilia: Secondary | ICD-10-CM | POA: Diagnosis not present

## 2016-12-19 ENCOUNTER — Other Ambulatory Visit: Payer: Self-pay | Admitting: Internal Medicine

## 2016-12-19 DIAGNOSIS — C7880 Secondary malignant neoplasm of unspecified digestive organ: Secondary | ICD-10-CM | POA: Diagnosis not present

## 2016-12-19 DIAGNOSIS — D6869 Other thrombophilia: Secondary | ICD-10-CM | POA: Diagnosis not present

## 2016-12-19 DIAGNOSIS — R197 Diarrhea, unspecified: Secondary | ICD-10-CM

## 2016-12-19 DIAGNOSIS — K219 Gastro-esophageal reflux disease without esophagitis: Secondary | ICD-10-CM | POA: Diagnosis not present

## 2016-12-19 DIAGNOSIS — C7951 Secondary malignant neoplasm of bone: Secondary | ICD-10-CM | POA: Diagnosis not present

## 2016-12-19 DIAGNOSIS — C3490 Malignant neoplasm of unspecified part of unspecified bronchus or lung: Secondary | ICD-10-CM | POA: Diagnosis not present

## 2016-12-20 ENCOUNTER — Other Ambulatory Visit: Payer: Self-pay | Admitting: Physician Assistant

## 2016-12-20 DIAGNOSIS — C3490 Malignant neoplasm of unspecified part of unspecified bronchus or lung: Secondary | ICD-10-CM | POA: Diagnosis not present

## 2016-12-20 DIAGNOSIS — K219 Gastro-esophageal reflux disease without esophagitis: Secondary | ICD-10-CM | POA: Diagnosis not present

## 2016-12-20 DIAGNOSIS — R197 Diarrhea, unspecified: Secondary | ICD-10-CM

## 2016-12-20 DIAGNOSIS — D6869 Other thrombophilia: Secondary | ICD-10-CM | POA: Diagnosis not present

## 2016-12-20 DIAGNOSIS — C7880 Secondary malignant neoplasm of unspecified digestive organ: Secondary | ICD-10-CM | POA: Diagnosis not present

## 2016-12-20 DIAGNOSIS — C7951 Secondary malignant neoplasm of bone: Secondary | ICD-10-CM | POA: Diagnosis not present

## 2016-12-23 ENCOUNTER — Other Ambulatory Visit: Payer: Self-pay | Admitting: Hematology

## 2016-12-23 ENCOUNTER — Telehealth: Payer: Self-pay | Admitting: *Deleted

## 2016-12-23 DIAGNOSIS — R197 Diarrhea, unspecified: Secondary | ICD-10-CM

## 2016-12-23 NOTE — Progress Notes (Signed)
Thoracic Location of Tumor / Histology: Metastatic non-small cell lung cancer with bilateral lung nodules and large metastatic lesion in the left Ilium. Referred back for evaluation of new left ventral chest wall lesion deep to the pectoralis musculature worrisome for metastatic disease.  Patient had a CT chest/abdomen/pelvis on 09/06/16 which showed a mixed response.      Tobacco/Marijuana/Snuff/ETOH use: former smoker. 1 PPD for 60 years. Quit January 2017.  Past/Anticipated interventions by cardiothoracic surgery, if any: no  Past/Anticipated interventions by medical oncology, if any:  Current Treatment  1) Active surveillance 2) Xgeva '120mg'$  [place] q4weeks for bone metastases.  Previous Treatment  1 Palliative radiation therapy to the large left ilium metastases 2. IV Nivolumab x 20ycles (discontinued due to likely immune colitis) 3. Xgeva '120mg'$  [place] q4weeks for bone metastases.    Signs/Symptoms  Weight changes, if any:   Respiratory complaints, if any:   Hemoptysis, if any:   Pain issues, if any:    SAFETY ISSUES:  Prior radiation? yes  Pacemaker/ICD? no   Possible current pregnancy?no  Is the patient on methotrexate? no  Current Complaints / other details:  76 year old female.

## 2016-12-23 NOTE — Telephone Encounter (Signed)
SW pt regarding upcoming apts.  Pt does not wish to keep radiation consult on 1/22 without speaking again to Dr. Irene Limbo.  Pt thinks she is supposed to get repeat CT in 2 months prior to starting radiation.  Pt wishes to cancel radiation consult at this time.  Will cancel Radiation consult for 1/22, and keep apts for Dr. Irene Limbo on 1/23.

## 2016-12-25 DIAGNOSIS — C7880 Secondary malignant neoplasm of unspecified digestive organ: Secondary | ICD-10-CM | POA: Diagnosis not present

## 2016-12-25 DIAGNOSIS — C3490 Malignant neoplasm of unspecified part of unspecified bronchus or lung: Secondary | ICD-10-CM | POA: Diagnosis not present

## 2016-12-25 DIAGNOSIS — D6869 Other thrombophilia: Secondary | ICD-10-CM | POA: Diagnosis not present

## 2016-12-25 DIAGNOSIS — C7951 Secondary malignant neoplasm of bone: Secondary | ICD-10-CM | POA: Diagnosis not present

## 2016-12-25 DIAGNOSIS — K219 Gastro-esophageal reflux disease without esophagitis: Secondary | ICD-10-CM | POA: Diagnosis not present

## 2016-12-26 ENCOUNTER — Ambulatory Visit
Admission: RE | Admit: 2016-12-26 | Discharge: 2016-12-26 | Disposition: A | Discharge status: Home / Self Care | Payer: Medicare Other | Source: Ambulatory Visit | Attending: Radiation Oncology | Admitting: Radiation Oncology

## 2016-12-26 ENCOUNTER — Ambulatory Visit: Payer: Medicare Other

## 2016-12-26 DIAGNOSIS — D6869 Other thrombophilia: Secondary | ICD-10-CM | POA: Diagnosis not present

## 2016-12-26 DIAGNOSIS — C3490 Malignant neoplasm of unspecified part of unspecified bronchus or lung: Secondary | ICD-10-CM | POA: Diagnosis not present

## 2016-12-26 DIAGNOSIS — C7951 Secondary malignant neoplasm of bone: Secondary | ICD-10-CM | POA: Diagnosis not present

## 2016-12-26 DIAGNOSIS — C7880 Secondary malignant neoplasm of unspecified digestive organ: Secondary | ICD-10-CM | POA: Diagnosis not present

## 2016-12-26 DIAGNOSIS — K219 Gastro-esophageal reflux disease without esophagitis: Secondary | ICD-10-CM | POA: Diagnosis not present

## 2016-12-27 ENCOUNTER — Encounter: Payer: Self-pay | Admitting: Hematology

## 2016-12-27 ENCOUNTER — Ambulatory Visit (HOSPITAL_BASED_OUTPATIENT_CLINIC_OR_DEPARTMENT_OTHER)

## 2016-12-27 ENCOUNTER — Other Ambulatory Visit: Payer: Self-pay | Admitting: *Deleted

## 2016-12-27 ENCOUNTER — Ambulatory Visit (HOSPITAL_BASED_OUTPATIENT_CLINIC_OR_DEPARTMENT_OTHER): Payer: Medicare Other | Admitting: Hematology

## 2016-12-27 ENCOUNTER — Telehealth: Payer: Self-pay | Admitting: Hematology

## 2016-12-27 ENCOUNTER — Other Ambulatory Visit (HOSPITAL_BASED_OUTPATIENT_CLINIC_OR_DEPARTMENT_OTHER)

## 2016-12-27 VITALS — BP 133/64 | HR 84 | Temp 98.1°F | Resp 16 | Ht 67.75 in | Wt 120.9 lb

## 2016-12-27 DIAGNOSIS — C3490 Malignant neoplasm of unspecified part of unspecified bronchus or lung: Secondary | ICD-10-CM

## 2016-12-27 DIAGNOSIS — C7951 Secondary malignant neoplasm of bone: Secondary | ICD-10-CM | POA: Diagnosis not present

## 2016-12-27 DIAGNOSIS — E872 Acidosis, unspecified: Secondary | ICD-10-CM

## 2016-12-27 DIAGNOSIS — Z7901 Long term (current) use of anticoagulants: Secondary | ICD-10-CM

## 2016-12-27 DIAGNOSIS — R197 Diarrhea, unspecified: Secondary | ICD-10-CM

## 2016-12-27 DIAGNOSIS — D6869 Other thrombophilia: Secondary | ICD-10-CM | POA: Diagnosis not present

## 2016-12-27 DIAGNOSIS — Z86711 Personal history of pulmonary embolism: Secondary | ICD-10-CM | POA: Diagnosis not present

## 2016-12-27 DIAGNOSIS — Z95828 Presence of other vascular implants and grafts: Secondary | ICD-10-CM

## 2016-12-27 DIAGNOSIS — C7880 Secondary malignant neoplasm of unspecified digestive organ: Secondary | ICD-10-CM | POA: Diagnosis not present

## 2016-12-27 DIAGNOSIS — K219 Gastro-esophageal reflux disease without esophagitis: Secondary | ICD-10-CM | POA: Diagnosis not present

## 2016-12-27 DIAGNOSIS — Z86718 Personal history of other venous thrombosis and embolism: Secondary | ICD-10-CM | POA: Diagnosis not present

## 2016-12-27 DIAGNOSIS — I2699 Other pulmonary embolism without acute cor pulmonale: Secondary | ICD-10-CM

## 2016-12-27 DIAGNOSIS — E875 Hyperkalemia: Secondary | ICD-10-CM

## 2016-12-27 LAB — CBC & DIFF AND RETIC
BASO%: 0.4 % (ref 0.0–2.0)
Basophils Absolute: 0 10*3/uL (ref 0.0–0.1)
EOS ABS: 0 10*3/uL (ref 0.0–0.5)
EOS%: 0.8 % (ref 0.0–7.0)
HEMATOCRIT: 27.8 % — AB (ref 34.8–46.6)
HEMOGLOBIN: 8.8 g/dL — AB (ref 11.6–15.9)
Immature Retic Fract: 12.4 % — ABNORMAL HIGH (ref 1.60–10.00)
LYMPH%: 15.8 % (ref 14.0–49.7)
MCH: 30.6 pg (ref 25.1–34.0)
MCHC: 31.7 g/dL (ref 31.5–36.0)
MCV: 96.5 fL (ref 79.5–101.0)
MONO#: 0.3 10*3/uL (ref 0.1–0.9)
MONO%: 6.7 % (ref 0.0–14.0)
NEUT%: 76.3 % (ref 38.4–76.8)
NEUTROS ABS: 3.7 10*3/uL (ref 1.5–6.5)
Platelets: 239 10*3/uL (ref 145–400)
RBC: 2.88 10*6/uL — ABNORMAL LOW (ref 3.70–5.45)
RDW: 14.9 % — ABNORMAL HIGH (ref 11.2–14.5)
Retic %: 2.31 % — ABNORMAL HIGH (ref 0.70–2.10)
Retic Ct Abs: 66.53 10*3/uL (ref 33.70–90.70)
WBC: 4.8 10*3/uL (ref 3.9–10.3)
lymph#: 0.8 10*3/uL — ABNORMAL LOW (ref 0.9–3.3)

## 2016-12-27 LAB — COMPREHENSIVE METABOLIC PANEL
ALBUMIN: 2 g/dL — AB (ref 3.5–5.0)
ALK PHOS: 114 U/L (ref 40–150)
ALT: 27 U/L (ref 0–55)
AST: 51 U/L — ABNORMAL HIGH (ref 5–34)
Anion Gap: 7 mEq/L (ref 3–11)
BUN: 12.7 mg/dL (ref 7.0–26.0)
CO2: 22 mEq/L (ref 22–29)
Calcium: 7.3 mg/dL — ABNORMAL LOW (ref 8.4–10.4)
Chloride: 113 mEq/L — ABNORMAL HIGH (ref 98–109)
Creatinine: 0.7 mg/dL (ref 0.6–1.1)
EGFR: 81 mL/min/{1.73_m2} — AB (ref >90)
Glucose: 121 mg/dl (ref 70–140)
POTASSIUM: 3 meq/L — AB (ref 3.5–5.1)
SODIUM: 141 meq/L (ref 136–145)
Total Bilirubin: 0.24 mg/dL (ref 0.20–1.20)
Total Protein: 5.8 g/dL — ABNORMAL LOW (ref 6.4–8.3)

## 2016-12-27 LAB — MAGNESIUM: MAGNESIUM: 1.2 mg/dL — AB (ref 1.5–2.5)

## 2016-12-27 MED ORDER — DIPHENHYDRAMINE HCL 50 MG/ML IJ SOLN: 50.0000 mg | Freq: Once | INTRAMUSCULAR | Status: DC | PRN

## 2016-12-27 MED ORDER — HEPARIN SOD (PORK) LOCK FLUSH 100 UNIT/ML IV SOLN
500.0000 [IU] | INTRAVENOUS | Status: AC | PRN
  Administered 2016-12-27: 500 [IU]
  Filled 2016-12-27: qty 5

## 2016-12-27 MED ORDER — SODIUM CHLORIDE 0.9 % IV SOLN: Freq: Once | INTRAVENOUS | Status: DC | PRN

## 2016-12-27 MED ORDER — MAGNESIUM OXIDE 400 (241.3 MG) MG PO TABS
400.0000 mg | ORAL_TABLET | Freq: Two times a day (BID) | ORAL | 0 refills | Status: DC
Start: 2016-12-27 — End: 2017-01-11

## 2016-12-27 MED ORDER — SACCHAROMYCES BOULARDII 250 MG PO CAPS: 250.0000 mg | ORAL_CAPSULE | Freq: Two times a day (BID) | ORAL | 0 refills | Status: DC

## 2016-12-27 MED ORDER — POTASSIUM CHLORIDE CRYS ER 20 MEQ PO TBCR: 20.0000 meq | EXTENDED_RELEASE_TABLET | Freq: Every day | ORAL | 1 refills | Status: DC

## 2016-12-27 MED ORDER — SODIUM CHLORIDE 0.9 % IJ SOLN
10.0000 mL | INTRAMUSCULAR | Status: AC | PRN
  Administered 2016-12-27: 10 mL
  Filled 2016-12-27: qty 10

## 2016-12-27 MED ORDER — METHYLPREDNISOLONE SODIUM SUCC 125 MG IJ SOLR: 125.0000 mg | Freq: Once | INTRAMUSCULAR | Status: DC | PRN

## 2016-12-27 MED ORDER — ALBUTEROL SULFATE (2.5 MG/3ML) 0.083% IN NEBU
2.5000 mg | INHALATION_SOLUTION | Freq: Once | RESPIRATORY_TRACT | Status: DC | PRN
  Filled 2016-12-27: qty 3

## 2016-12-27 MED ORDER — EPINEPHRINE HCL 0.1 MG/ML IJ SOLN: 0.2500 mg | Freq: Once | INTRAMUSCULAR | Status: DC | PRN

## 2016-12-27 MED ORDER — MAGNESIUM OXIDE 400 (241.3 MG) MG PO TABS: 400.0000 mg | ORAL_TABLET | Freq: Two times a day (BID) | ORAL | 0 refills | Status: DC

## 2016-12-27 MED ORDER — DIPHENHYDRAMINE HCL 50 MG/ML IJ SOLN: 25.0000 mg | Freq: Once | INTRAMUSCULAR | Status: DC | PRN

## 2016-12-27 NOTE — Telephone Encounter (Signed)
Appointments scheduled per 1/23 LOS. Patient given two bottles of contrast for CT scan appointment, AVS report, and calendars with future scheduled appointments.

## 2016-12-28 LAB — PHOSPHORUS: Phosphorus, Ser: 2.9 mg/dL (ref 2.5–4.5)

## 2016-12-29 ENCOUNTER — Ambulatory Visit (INDEPENDENT_AMBULATORY_CARE_PROVIDER_SITE_OTHER): Payer: Medicare Other | Admitting: Internal Medicine

## 2016-12-29 ENCOUNTER — Encounter: Payer: Self-pay | Admitting: Internal Medicine

## 2016-12-29 VITALS — BP 118/70 | HR 70 | Ht 67.0 in | Wt 121.0 lb

## 2016-12-29 DIAGNOSIS — C3490 Malignant neoplasm of unspecified part of unspecified bronchus or lung: Secondary | ICD-10-CM | POA: Diagnosis not present

## 2016-12-29 DIAGNOSIS — K529 Noninfective gastroenteritis and colitis, unspecified: Secondary | ICD-10-CM | POA: Diagnosis not present

## 2016-12-29 DIAGNOSIS — Z8619 Personal history of other infectious and parasitic diseases: Secondary | ICD-10-CM

## 2016-12-29 DIAGNOSIS — C7880 Secondary malignant neoplasm of unspecified digestive organ: Secondary | ICD-10-CM | POA: Diagnosis not present

## 2016-12-29 DIAGNOSIS — D6869 Other thrombophilia: Secondary | ICD-10-CM | POA: Diagnosis not present

## 2016-12-29 DIAGNOSIS — C7951 Secondary malignant neoplasm of bone: Secondary | ICD-10-CM | POA: Diagnosis not present

## 2016-12-29 DIAGNOSIS — K219 Gastro-esophageal reflux disease without esophagitis: Secondary | ICD-10-CM | POA: Diagnosis not present

## 2016-12-29 DIAGNOSIS — K22711 Barrett's esophagus with high grade dysplasia: Secondary | ICD-10-CM | POA: Diagnosis not present

## 2016-12-29 MED ORDER — ONDANSETRON HCL 8 MG PO TABS: 8.0000 mg | ORAL_TABLET | Freq: Three times a day (TID) | ORAL | 0 refills | Status: DC | PRN

## 2016-12-29 MED ORDER — MESALAMINE 1.2 G PO TBEC: 4.8000 g | DELAYED_RELEASE_TABLET | Freq: Every day | ORAL | 2 refills | Status: DC

## 2016-12-29 MED ORDER — OMEPRAZOLE 40 MG PO CPDR: DELAYED_RELEASE_CAPSULE | ORAL | 0 refills | Status: DC

## 2016-12-29 NOTE — Progress Notes (Signed)
Subjective:    Patient ID: Cindy Byrd, female    DOB: 1941/10/28, 76 y.o.   MRN: [id number]  HPI Cindy Byrd is a 76 year old female with a history of metastatic lung cancer, history of chemotherapy-induced pan colitis, history of C. difficile colitis, history of Barrett's with at least high-grade dysplasia, history of DVT and PE on Lovenox who is here for follow-up. She was last seen on 10/12/2016 by Nicoletta Ba, PA-C. She was having severe issues with diarrhea and C. difficile infection.  On the whole she has improved significantly from a GI perspective. She reports her diarrhea has resolved. If she eats normally meaning 3 meals a day she has 1 bowel movement per day. At times her appetite is decreased and she eats smaller meals less frequently. On these days she doesn't always have a bowel movement. She denies seeing blood in her stool or melena. She occasionally has some short-lived midabdominal pain which tends to improve with bowel movement and even drinking water. She reports she's had no trouble swallowing or with heartburn. Nausea has not been a problem recently though she has Zofran to use as needed for this issue. No vomiting.  She is using Questran 4 g twice a day, budesonide 9 mg daily, Lomotil rarely, Solimene 4.8 g a day, Florastor 250 twice a day and omeprazole 40 mg daily  She has had surveillance imaging recently with Dr. Irene Limbo which did not show any specific GI abnormality but showed possible increase in lung lesion.   Review of Systems As per history of present illness, otherwise negative  Current Medications, Allergies, Past Medical History, Past Surgical History, Family History and Social History were reviewed in [employer] record.     Objective:   Physical Exam BP 118/70   Pulse 70   Ht '5\' 7"'$  (1.702 m)   Wt 121 lb (54.9 kg)   SpO2 99%   BMI 18.95 kg/m  Constitutional: Well-developed and well-nourished. No distress. HEENT:  Normocephalic and atraumatic. Oropharynx is clear and moist. No oropharyngeal exudate. Conjunctivae are normal.   Neck: Neck supple. Trachea midline. Cardiovascular: Normal rate, regular rhythm and intact distal pulses. No M/R/G Pulmonary/chest: Effort normal and breath sounds normal. No wheezing, rales or rhonchi. Abdominal: Soft, thin, nontender, nondistended. Bowel sounds active throughout. There are no masses palpable. No hepatosplenomegaly. Extremities: no clubbing, cyanosis, 2+ lower extremity edema bilaterally Lymphadenopathy: No cervical adenopathy noted. Neurological: Alert and oriented to person place and time. Skin: Skin is warm and dry. No rashes noted. Psychiatric: Normal mood and affect. Behavior is normal.  CBC    Component Value Date/Time   WBC 4.8 12/27/2016 1500   WBC 8.2 10/12/2016 1631   RBC 2.88 (L) 12/27/2016 1500   RBC 4.17 10/12/2016 1631   HGB 8.8 (L) 12/27/2016 1500   HCT 27.8 (L) 12/27/2016 1500   PLT 239 12/27/2016 1500   MCV 96.5 12/27/2016 1500   MCH 30.6 12/27/2016 1500   MCH 27.0 09/12/2016 0730   MCHC 31.7 12/27/2016 1500   MCHC 33.4 10/12/2016 1631   RDW 14.9 (H) 12/27/2016 1500   LYMPHSABS 0.8 (L) 12/27/2016 1500   MONOABS 0.3 12/27/2016 1500   EOSABS 0.0 12/27/2016 1500   BASOSABS 0.0 12/27/2016 1500   CMP     Component Value Date/Time   NA 141 12/27/2016 1500   K 3.0 (LL) 12/27/2016 1500   CL 110 10/12/2016 1631   CO2 22 12/27/2016 1500   GLUCOSE 121 12/27/2016 1500   BUN  12.7 12/27/2016 1500   CREATININE 0.7 12/27/2016 1500   CALCIUM 7.3 (L) 12/27/2016 1500   PROT 5.8 (L) 12/27/2016 1500   ALBUMIN 2.0 (L) 12/27/2016 1500   AST 51 (H) 12/27/2016 1500   ALT 27 12/27/2016 1500   ALKPHOS 114 12/27/2016 1500   BILITOT 0.24 12/27/2016 1500   GFRNONAA >60 09/13/2016 0420   GFRAA >60 09/13/2016 0420       Assessment & Plan:  76 year old female with a history of metastatic lung cancer, history of chemotherapy-induced pan colitis,  history of C. difficile colitis, history of Barrett's with at least high-grade dysplasia who is here for follow-up.  1. Chemotherapy-induced colitis/history of C. Difficile -- diarrhea has improved significantly and I think we can start to wean off some of her medications. We have discussed this today. I'm going to stop cholestyramine first. If this does not result in diarrhea and I will have her wean off budesonide by decreasing to 6 mg daily until follow-up. She can continue Lomotil on an as-needed basis. I do feel she should maintain Lialda 4.8 g daily (she was told she could take this all together rather than splitting it up twice a day). I also feel that she can stop Florastor after successful C. difficile treatment without recurrence.  2. History of Barrett's esophagus with at least high-grade dysplasia -- she was referred and had one visit with Dr. Adria Devon at [facility] though her other medical conditions, specifically lung cancer and treatment, has prevented follow-up. I do feel that she should see Dr. Adria Devon again for endoscopy and repeat biopsy if Dr. Irene Limbo feels like the lung cancer treatment is going well. I will ask for his opinion in this regard. In the interim continue omeprazole 40 mg daily. There is no dysphagia or esophageal symptoms at present, which is reassuring. Cross-sectional imaging is also not specifically suggested an esophageal lesion.  3. Lower extremity edema -- previously on Lasix. She has had issues with hypokalemia. Will defer this to Dr. Irene Limbo and/or primary care.  25 minutes spent with the patient today. Greater than 50% was spent in counseling and coordination of care with the patient 3 month follow-up with me

## 2016-12-29 NOTE — Patient Instructions (Addendum)
Discontinue questran. If you do not have any recurrent diarrhea after discontinuing, you may decrease your budesonide to 2 tablets.  Continue Lialda 4.8 g daily.  Continue omeprazole.  Discontinue Florastor.  Continue zofran as needed.  Please follow up with Dr [name] in 3 months.  If you are age 77 or older, your body mass index should be between 23-30. Your Body mass index is 18.95 kg/m. If this is out of the aforementioned range listed, please consider follow up with your Primary Care Provider.  If you are age 11 or younger, your body mass index should be between 19-25. Your Body mass index is 18.95 kg/m. If this is out of the aformentioned range listed, please consider follow up with your Primary Care Provider.

## 2016-12-30 DIAGNOSIS — C7880 Secondary malignant neoplasm of unspecified digestive organ: Secondary | ICD-10-CM | POA: Diagnosis not present

## 2016-12-30 DIAGNOSIS — C3490 Malignant neoplasm of unspecified part of unspecified bronchus or lung: Secondary | ICD-10-CM | POA: Diagnosis not present

## 2016-12-30 DIAGNOSIS — D6869 Other thrombophilia: Secondary | ICD-10-CM | POA: Diagnosis not present

## 2016-12-30 DIAGNOSIS — K219 Gastro-esophageal reflux disease without esophagitis: Secondary | ICD-10-CM | POA: Diagnosis not present

## 2016-12-30 DIAGNOSIS — C7951 Secondary malignant neoplasm of bone: Secondary | ICD-10-CM | POA: Diagnosis not present

## 2016-12-31 DIAGNOSIS — C7951 Secondary malignant neoplasm of bone: Secondary | ICD-10-CM | POA: Diagnosis not present

## 2016-12-31 DIAGNOSIS — D6869 Other thrombophilia: Secondary | ICD-10-CM | POA: Diagnosis not present

## 2016-12-31 DIAGNOSIS — C7880 Secondary malignant neoplasm of unspecified digestive organ: Secondary | ICD-10-CM | POA: Diagnosis not present

## 2016-12-31 DIAGNOSIS — C3490 Malignant neoplasm of unspecified part of unspecified bronchus or lung: Secondary | ICD-10-CM | POA: Diagnosis not present

## 2016-12-31 DIAGNOSIS — K219 Gastro-esophageal reflux disease without esophagitis: Secondary | ICD-10-CM | POA: Diagnosis not present

## 2017-01-02 DIAGNOSIS — C7951 Secondary malignant neoplasm of bone: Secondary | ICD-10-CM | POA: Diagnosis not present

## 2017-01-02 DIAGNOSIS — D6869 Other thrombophilia: Secondary | ICD-10-CM | POA: Diagnosis not present

## 2017-01-02 DIAGNOSIS — C3490 Malignant neoplasm of unspecified part of unspecified bronchus or lung: Secondary | ICD-10-CM | POA: Diagnosis not present

## 2017-01-02 DIAGNOSIS — C7880 Secondary malignant neoplasm of unspecified digestive organ: Secondary | ICD-10-CM | POA: Diagnosis not present

## 2017-01-02 DIAGNOSIS — K219 Gastro-esophageal reflux disease without esophagitis: Secondary | ICD-10-CM | POA: Diagnosis not present

## 2017-01-02 NOTE — Progress Notes (Signed)
[name]    HEMATOLOGY ONCOLOGY PROGRESS NOTE  Date of service: .12/27/2016  Patient Care Team: Tamsen Roers, MD as PCP - General (Family Medicine) [name], MD as Consulting Physician (Hematology and Oncology)  CHIEF COMPLAINTS/PURPOSE OF CONSULTATION: Follow-up for metastatic lung cancer  DIAGNOSIS:   #1 Metastatic non-small cell lung cancer with bilateral lung nodules and large metastatic lesion in the left Ilium. #2 Barrett's esophagus with some evidence of intramucosal adenocarcinoma of the esophagus. 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  Previous Treatment  1 Palliative radiation therapy to the large left ilium metastases 2. IV Nivolumab x 20ycles (discontinued due to likely immune colitis) 3. Xgeva 124mSC q4weeks for bone metastases.   HISTORY OF PRESENTING ILLNESS: (plz see my previous consultation for details of initial presentation)  INTERVAL HISTORY  Cindy ToFlittons here for her scheduled followup for metastatic lung cancer. She notes her diarrhea is much better controlled and that she only has 1-2 bowel movements a day. No Fevers or chills. Notes that her appetite had improved significantly. She is going to be seeing Cindy PyHilarie Fredricksonnd will discuss if any of her medications can be weaned off especially the cholestyramine and steroids. No abdominal pain. No GI bleeding. She notes her breathing is stable. Having issues with her significant other and notes that she wants to separate. Persistent b/l lower extremity swelling.  MEDICAL HISTORY:  Past Medical History:  Diagnosis Date  . Barrett's esophagus   . Bone neoplasm 06/24/2015  [facility]   metastatic poorly differentiated carcinoma. tumor left groin surgical removal with radiation tx.  . Cataract    BILATERAL  .  Cigarette smoker two packs a day or less    Currently still smoking 2 PPD - Not interested in quitting at this time.  . Colitis 2017  . Colon polyps    hyperplastic, tubular adenomas, tubulovillous adenoma  . Cough, persistent    hx. lung cancer ? primary-being evaluated, unsure of primary site.  . Depression 06/24/2015  . Diverticulosis   . [facility]  . Endometriosis    Hysterectomy with BSO at age [age]rs  . [facility]   intramucosal  . Gastritis   . GERD (gastroesophageal reflux disease)   . H/O: pneumonia   . Hiatal hernia   . Hyperlipidemia   . Hypertension 06/24/2015   likely improved incidental to 40 lbs weight loss from her neoplasm. No Longer taking med for this as of 08-06-15  . IBS (irritable bowel syndrome)   . Pain    left hip-persistent"tumor of bone"-radiation tx. 10.  . Vitamin D deficiency disease    SURGICAL HISTORY: Past Surgical History:  Procedure Laterality Date  . BARTHOLIN GLAND CYST EXCISION  5245o ago   Does not want if it was an infected cyst or tumor. Was soon as delivery  . COLONOSCOPY W/ POLYPECTOMY     multiple times - last done 09/2014 per patient.  . ESOPHAGOGASTRODUODENOSCOPY (EGD) WITH PROPOFOL N/A 08/11/2015   Procedure: ESOPHAGOGASTRODUODENOSCOPY (EGD) WITH PROPOFOL;  Surgeon: JaJerene BearsMD;  Location: [facility];  Service: Gastroenterology;  Laterality: N/A;  . GANGLION CYST EXCISION    . KNEE ARTHROSCOPY  age about [age]rs  . TONSILLECTOMY    . TOTAL ABDOMINAL HYSTERECTOMY W/ BILATERAL SALPINGOOPHORECTOMY  at  age 47 yrs   For endometriosis    SOCIAL HISTORY: Social History   Social History  . Marital status: Widowed    Spouse name: N/A  . Number of children: 2  . Years of education: N/A   Occupational History  . Not on file.   Social History Main Topics  . Smoking status: Former Smoker    Packs/day: 1.00    Years: 60.00    Types: Cigarettes    Quit date: 12/06/2015  . Smokeless  tobacco: Never Used  . Alcohol use No  . Drug use: No  . Sexual activity: No   Other Topics Concern  . Not on file   Social History Narrative  . No narrative on file    FAMILY HISTORY: Family History  Problem Relation Age of Onset  . Colon cancer Brother   . Colon cancer Brother   . Stroke Mother   . Colon cancer Father   . Breast cancer Daughter 81    ER/PR+ stage II    ALLERGIES:  is allergic to penicillins; remeron [mirtazapine]; and latex. patient wonders if she has a penicillin allergy but notes that she is uncertain about this.  MEDICATIONS:  Current Outpatient Prescriptions  Medication Sig Dispense Refill  . budesonide (ENTOCORT EC) 3 MG 24 hr capsule Take 3 capsules (9 mg total) by mouth daily. 90 capsule 0  . calcium-vitamin D (OSCAL WITH D) 500-200 MG-UNIT tablet Take 2 tablets by mouth 3 (three) times daily.    . cholestyramine (QUESTRAN) 4 g packet TAKE 1 PACKET (4 G TOTAL) BY MOUTH 2 (TWO) TIMES DAILY. 60 packet 0  . citalopram (CELEXA) 20 MG tablet Take 1 tablet (20 mg total) by mouth daily. 30 tablet 0  . diphenoxylate-atropine (LOMOTIL) 2.5-0.025 MG tablet Take 1-2 tablets by mouth 4 (four) times daily as needed for diarrhea or loose stools. 60 tablet 0  . enoxaparin (LOVENOX) 80 MG/0.8ML injection Inject 0.7 mLs (70 mg total) into the skin daily. 30 Syringe 1  . lidocaine-prilocaine (EMLA) cream Apply small amount over port 1-2 hours prior to treatment, cover with plastic wrap (DO NOT RUB IN). 30 g prn  . liver oil-zinc oxide (DESITIN) 40 % ointment Apply topically daily. 56.7 g 0  . LORazepam (ATIVAN) 1 MG tablet Take 1 tablet (1 mg total) by mouth every 8 (eight) hours as needed for anxiety (or nausea). 15 tablet 0  . magnesium oxide (MAG-OX) 400 (241.3 Mg) MG tablet Take 1 tablet (400 mg total) by mouth 2 (two) times daily. 60 tablet 0  . mesalamine (LIALDA) 1.2 g EC tablet Take 4 tablets (4.8 g total) by mouth daily with breakfast. 120 tablet 2  .  omeprazole (PRILOSEC) 40 MG capsule TAKE 1 CAPSULE BY MOUTH EVERY DAY 90 capsule 0  . ondansetron (ZOFRAN) 8 MG tablet Take 1 tablet (8 mg total) by mouth every 8 (eight) hours as needed for nausea or vomiting. 60 tablet 0  . oxyCODONE-acetaminophen (PERCOCET/ROXICET) 5-325 MG tablet Take 1 tablet by mouth every 4 (four) hours as needed for severe pain. 15 tablet 0  . potassium chloride SA (K-DUR,KLOR-CON) 20 MEQ tablet Take 1 tablet (20 mEq total) by mouth daily. Potassium 46mq po twice daily for 3 days then 251m po daily 30 tablet 1  . PRESCRIPTION MEDICATION Antibody Plan [facility]    . VITAMIN D, ERGOCALCIFEROL, PO Give 0.5m14m4,000 units) by mouth once daily    . zolpidem (AMBIEN CR) 12.5 MG CR tablet Take 1 tablet (  12.5 mg total) by mouth at bedtime as needed for sleep. 30 tablet 0   No current facility-administered medications for this visit.     REVIEW OF SYSTEMS:    10 point review of systems done and was noted to be negative except as noted above.  PHYSICAL EXAMINATION: ECOG PERFORMANCE STATUS: 2 - Symptomatic, <50% confined to bed  Vitals:   12/27/16 1541  BP: 133/64  Pulse: 84  Resp: 16  Temp: 98.1 F (36.7 C)   Filed Weights   12/27/16 1541  Weight: 120 lb 14.4 oz (54.8 kg)  . Wt Readings from Last 3 Encounters:  12/29/16 121 lb (54.9 kg)  12/27/16 120 lb 14.4 oz (54.8 kg)  12/09/16 105 lb 9.6 oz (47.9 kg)   GENERAL:alert, NAD, appears well. SKIN: skin color, texture, turgor are normal, no rashes or significant lesions EYES: normal, conjunctiva are pink and non-injected, sclera clear OROPHARYNX:no exudate, no erythema and lips, buccal mucosa, and tongue normal  NECK: supple, thyroid normal size, non-tender, without nodularity LYMPH:  no palpable lymphadenopathy in the cervical, axillary or inguinal LUNGS: clear to auscultation and bilateral reduced breath sounds with normal respiratory effort  HEART: regular rate & rhythm and no murmurs and resolved lower extremity  edema ABDOMEN:abdomen soft, non-tender and normal bowel sounds Musculoskeletal: No pedal edema. No calf pain or tenderness.  PSYCH: alert & oriented x 3 with fluent speech NEURO: no focal motor/sensory deficits.  LABORATORY DATA:  I have reviewed the data as listed . CBC Latest Ref Rng & Units 12/27/2016 12/08/2016 10/20/2016  WBC 3.9 - 10.3 10e3/uL 4.8 5.9 7.9  Hemoglobin 11.6 - 15.9 g/dL 8.8(L) 8.8(L) 10.4(L)  Hematocrit 34.8 - 46.6 % 27.8(L) 28.1(L) 31.3(L)  Platelets 145 - 400 10e3/uL 239 368 216   . CMP Latest Ref Rng & Units 12/27/2016 12/08/2016 10/20/2016  Glucose 70 - 140 mg/dl 121 90 131  BUN 7.0 - 26.0 mg/dL 12.7 13.0 6.2(L)  Creatinine 0.6 - 1.1 mg/dL 0.7 0.8 0.8  Sodium 136 - 145 mEq/L 141 139 138  Potassium 3.5 - 5.1 mEq/L 3.0(LL) 5.5 No visable hemolysis(H) 2.9(LL)  Chloride 96 - 112 mEq/L - - -  CO2 22 - 29 mEq/L 22 15(L) 17(L)  Calcium 8.4 - 10.4 mg/dL 7.3(L) 7.9(L) 6.3(LL)  Total Protein 6.4 - 8.3 g/dL 5.8(L) 6.7 5.8(L)  Total Bilirubin 0.20 - 1.20 mg/dL 0.24 0.23 0.57  Alkaline Phos 40 - 150 U/L 114 122 170(H)  AST 5 - 34 U/L 51(H) 16 44(H)  ALT 0 - 55 U/L 27 11 34     RADIOGRAPHIC STUDIES: I have personally reviewed the radiological images as listed and agreed with the findings in the report. Ct Chest W Contrast  Result Date: 12/08/2016 CLINICAL DATA:  Restaging lung cancer EXAM: CT CHEST, ABDOMEN, AND PELVIS WITH CONTRAST TECHNIQUE: Multidetector CT imaging of the chest, abdomen and pelvis was performed following the standard protocol during bolus administration of intravenous contrast. CONTRAST:  1 ISOVUE-300 IOPAMIDOL (ISOVUE-300) INJECTION 61% COMPARISON:  10/03/2016. FINDINGS: CT CHEST FINDINGS Cardiovascular: Normal heart size. No pericardial effusion. Aortic atherosclerosis is noted. Evidence of chronic pulmonary embolus to the left lower lobe, image 74 of series 5. This is in the same vessel which was affected by acute pulmonary embolus as described on  09/02/2016. The right lower lobe pulmonary embolus from 09/02/2016 has resolved in the interval. Mediastinum/Nodes: The trachea appears patent and is midline. Normal appearance of the esophagus. No enlarged mediastinal or hilar lymph nodes. Interval resolution of  low-attenuation mass within the left chest wall deep to the pectoralis musculature. Lungs/Pleura: New small left pleural effusion. Moderate changes of centrilobular emphysema. The pleural base mass within the posterior left base measures 6.2 x 2.2 x 6.9 cm, image 58 of series 2 and image 137 of series 6. On the previous exam this measured 4.2 x 2.0 x 2.8 cm. Musculoskeletal: There are acute fracture deformities involving the posterior aspects of the tenth and eleventh left ribs. These appear posttraumatic, but given the close proximity to the pleural based tumor overlying the posterior left lower lobe pathologic fractures cannot be excluded. CT ABDOMEN PELVIS FINDINGS Hepatobiliary: Similar appearance of low-attenuation foci within the liver, unchanged from previous exam. Likely cysts. The gallbladder appears normal. Pancreas: Unremarkable. No pancreatic ductal dilatation or surrounding inflammatory changes. Spleen: Normal in size without focal abnormality. Adrenals/Urinary Tract: Adrenal glands are unremarkable. Kidneys are normal, without renal calculi, focal lesion, or hydronephrosis. Bladder is unremarkable. Stomach/Bowel: Stomach is within normal limits. Appendix appears normal. No evidence of bowel wall thickening, distention, or inflammatory changes. Vascular/Lymphatic: Aortic atherosclerosis No enlarged retroperitoneal or mesenteric adenopathy. No enlarged pelvic or inguinal lymph nodes. Reproductive: Status post hysterectomy. No adnexal masses. Other: No abdominal wall hernia or abnormality. No abdominopelvic ascites. Musculoskeletal: There is large destructive bone lesion involving the left iliac wing measuring 10 cm, image 92 of series 2.  Extensive fragmentation of the left iliac bone is identified. This appears unchanged from the previous exam. IMPRESSION: 1. Mixed interval response to therapy. 2. Interval increase in size of pleural base mass overlying the posterior and inferior left lower lobe. There is also a new left pleural effusion identified. 3. Cystic mass involving the left ventral chest wall has resolved in the interval. 4. Similar appearance of large destructive lesion involving the left iliac bone. 5. New fractures involving the posterior aspect of the left tenth and eleventh ribs. Favor posttraumatic. However, given close proximity to left pleural tumor cannot rule out pathologic fractures. 6. Chronic pulmonary embolus to the left lower lobe. Electronically Signed   By: Kerby Moors M.D.   On: 12/08/2016 10:57   Ct Abdomen Pelvis W Contrast  Result Date: 12/08/2016 CLINICAL DATA:  Restaging lung cancer EXAM: CT CHEST, ABDOMEN, AND PELVIS WITH CONTRAST TECHNIQUE: Multidetector CT imaging of the chest, abdomen and pelvis was performed following the standard protocol during bolus administration of intravenous contrast. CONTRAST:  1 ISOVUE-300 IOPAMIDOL (ISOVUE-300) INJECTION 61% COMPARISON:  10/03/2016. FINDINGS: CT CHEST FINDINGS Cardiovascular: Normal heart size. No pericardial effusion. Aortic atherosclerosis is noted. Evidence of chronic pulmonary embolus to the left lower lobe, image 74 of series 5. This is in the same vessel which was affected by acute pulmonary embolus as described on 09/02/2016. The right lower lobe pulmonary embolus from 09/02/2016 has resolved in the interval. Mediastinum/Nodes: The trachea appears patent and is midline. Normal appearance of the esophagus. No enlarged mediastinal or hilar lymph nodes. Interval resolution of low-attenuation mass within the left chest wall deep to the pectoralis musculature. Lungs/Pleura: New small left pleural effusion. Moderate changes of centrilobular emphysema. The pleural  base mass within the posterior left base measures 6.2 x 2.2 x 6.9 cm, image 58 of series 2 and image 137 of series 6. On the previous exam this measured 4.2 x 2.0 x 2.8 cm. Musculoskeletal: There are acute fracture deformities involving the posterior aspects of the tenth and eleventh left ribs. These appear posttraumatic, but given the close proximity to the pleural based tumor overlying the posterior left  lower lobe pathologic fractures cannot be excluded. CT ABDOMEN PELVIS FINDINGS Hepatobiliary: Similar appearance of low-attenuation foci within the liver, unchanged from previous exam. Likely cysts. The gallbladder appears normal. Pancreas: Unremarkable. No pancreatic ductal dilatation or surrounding inflammatory changes. Spleen: Normal in size without focal abnormality. Adrenals/Urinary Tract: Adrenal glands are unremarkable. Kidneys are normal, without renal calculi, focal lesion, or hydronephrosis. Bladder is unremarkable. Stomach/Bowel: Stomach is within normal limits. Appendix appears normal. No evidence of bowel wall thickening, distention, or inflammatory changes. Vascular/Lymphatic: Aortic atherosclerosis No enlarged retroperitoneal or mesenteric adenopathy. No enlarged pelvic or inguinal lymph nodes. Reproductive: Status post hysterectomy. No adnexal masses. Other: No abdominal wall hernia or abnormality. No abdominopelvic ascites. Musculoskeletal: There is large destructive bone lesion involving the left iliac wing measuring 10 cm, image 92 of series 2. Extensive fragmentation of the left iliac bone is identified. This appears unchanged from the previous exam. IMPRESSION: 1. Mixed interval response to therapy. 2. Interval increase in size of pleural base mass overlying the posterior and inferior left lower lobe. There is also a new left pleural effusion identified. 3. Cystic mass involving the left ventral chest wall has resolved in the interval. 4. Similar appearance of large destructive lesion  involving the left iliac bone. 5. New fractures involving the posterior aspect of the left tenth and eleventh ribs. Favor posttraumatic. However, given close proximity to left pleural tumor cannot rule out pathologic fractures. 6. Chronic pulmonary embolus to the left lower lobe. Electronically Signed   By: Kerby Moors M.D.   On: 12/08/2016 10:57    ASSESSMENT & PLAN:   #1 Metastatic poorly differentiated carcinoma with likely lung primary [non-small cell lung cancer].  CT of the head with and without contrast showed no evidence of metastatic disease. Patient notes much improved pain control. EGFR blood test mutation analysis negative. Patient's pain is much better controlled after radiation for the painful ilium met. CT chest abdomen pelvis 04/19/2016 shows no evidence of disease progression. Patient tolerated Nivolumab very well until recently when she developed grade 2 Immune colitis as a result of which Nivolumab has been discontinued. CT chest abdomen pelvis on 06/24/2016 shows no evidence of new disease or progression of metastatic disease. CT chest abdomen pelvis 09/06/2016 shows 1. Mixed interval response to therapy. 2. There is a new left ventral chest wall lesion deep to the pectoralis musculature worrisome for metastatic disease. 3. Posterior lower lobe nodular densities are identified which may reflect areas of pulmonary metastasis. 4. Interval decrease in size of destructive lesion involving the left iliac bone.  CT chest abd pelvis 12/08/2016: Cystic mass involving the left ventral chest wall has resolved in the interval. Likely was a hematoma due to trauma. Interval increase in size of pleural base mass overlying the posterior and inferior left lower lobe. There is also a new left pleural effusion identified.  PLAN -No symptoms suggestive of disease progression - we'll plan to repeat CT imaging in 1st week of march 2018 - patient counseled on fall precautions and encouraged to use  her cane and walker as instructed . - she was recommended to focus on optimizing her nutritional status .  #2 diarrhea-  now resolved was previouslygrade 2 likely related to immune colitis from her Nivolumab now with c diff colitis (s/p vancomycin) She is currently on Lialda, budesonide, cholestyramine, probiotics and lomotil (not using her lomotil much) Plan  -Diarrhea is much better controlled -Continue sandostatin -continue f/u with GI to see if her po steroids, cholestyramine and other  medications could be tapered/stopped.   #3 Hyperkalemia and acidosis on last visit - resolved Now with mild hypokalemia Plan -restart potassium replacement. -adequate by mouth fluid intake  -Repeat labs in 2 weeks   #4 DVT and PE  -will need to be on ongoing Lovenox therapy -Monitor for bleeding .  #5 Barrett's esophagus 4cms in the distal esophagus with low and high-grade dysplasia cannot rule out an early intra-mucosal esophageal adenocarcinoma. Plan -Patient being monitored by Cindy. [name] from GI.  Continue [place] Octreotide and Xgeva q4weeks  Rpt labs in 3 weeks  Ct chest in 6 weeks RTC with Cindy Byrd in 6 weeks with labs and CT chest   All questions were answered. The patient knows to call the clinic with any problems, questions or concerns.  Sullivan Lone MD [place] Hematology/Oncology Physician [facility]  (Office): [phone] (Work cell): [phone] (Fax): [phone]

## 2017-01-05 ENCOUNTER — Encounter: Payer: Self-pay | Admitting: Hematology

## 2017-01-05 ENCOUNTER — Other Ambulatory Visit: Payer: Self-pay | Admitting: Hematology

## 2017-01-05 DIAGNOSIS — C3490 Malignant neoplasm of unspecified part of unspecified bronchus or lung: Secondary | ICD-10-CM | POA: Diagnosis not present

## 2017-01-05 DIAGNOSIS — D6869 Other thrombophilia: Secondary | ICD-10-CM | POA: Diagnosis not present

## 2017-01-05 DIAGNOSIS — C7951 Secondary malignant neoplasm of bone: Secondary | ICD-10-CM | POA: Diagnosis not present

## 2017-01-05 DIAGNOSIS — R197 Diarrhea, unspecified: Secondary | ICD-10-CM

## 2017-01-05 DIAGNOSIS — K219 Gastro-esophageal reflux disease without esophagitis: Secondary | ICD-10-CM | POA: Diagnosis not present

## 2017-01-05 DIAGNOSIS — C7880 Secondary malignant neoplasm of unspecified digestive organ: Secondary | ICD-10-CM | POA: Diagnosis not present

## 2017-01-05 NOTE — Progress Notes (Signed)
Pt is under Hospice care so I called [facility] and notified them of the auth request for pt's Enoxaparin.  They will take care of it.

## 2017-01-06 ENCOUNTER — Ambulatory Visit (HOSPITAL_BASED_OUTPATIENT_CLINIC_OR_DEPARTMENT_OTHER): Payer: Medicare Other

## 2017-01-06 ENCOUNTER — Other Ambulatory Visit (HOSPITAL_BASED_OUTPATIENT_CLINIC_OR_DEPARTMENT_OTHER): Payer: Medicare Other

## 2017-01-06 ENCOUNTER — Other Ambulatory Visit: Payer: Medicare Other

## 2017-01-06 ENCOUNTER — Other Ambulatory Visit: Payer: Self-pay | Admitting: *Deleted

## 2017-01-06 DIAGNOSIS — Z95828 Presence of other vascular implants and grafts: Secondary | ICD-10-CM

## 2017-01-06 DIAGNOSIS — C3490 Malignant neoplasm of unspecified part of unspecified bronchus or lung: Secondary | ICD-10-CM

## 2017-01-06 DIAGNOSIS — C7951 Secondary malignant neoplasm of bone: Secondary | ICD-10-CM | POA: Diagnosis not present

## 2017-01-06 DIAGNOSIS — C3491 Malignant neoplasm of unspecified part of right bronchus or lung: Secondary | ICD-10-CM

## 2017-01-06 LAB — CBC & DIFF AND RETIC
BASO%: 0.6 % (ref 0.0–2.0)
BASOS ABS: 0 10*3/uL (ref 0.0–0.1)
EOS%: 0.2 % (ref 0.0–7.0)
Eosinophils Absolute: 0 10*3/uL (ref 0.0–0.5)
HEMATOCRIT: 28 % — AB (ref 34.8–46.6)
HEMOGLOBIN: 9.2 g/dL — AB (ref 11.6–15.9)
Immature Retic Fract: 8.1 % (ref 1.60–10.00)
LYMPH#: 0.9 10*3/uL (ref 0.9–3.3)
LYMPH%: 13.7 % — ABNORMAL LOW (ref 14.0–49.7)
MCH: 30.4 pg (ref 25.1–34.0)
MCHC: 32.9 g/dL (ref 31.5–36.0)
MCV: 92.4 fL (ref 79.5–101.0)
MONO#: 0.3 10*3/uL (ref 0.1–0.9)
MONO%: 4.1 % (ref 0.0–14.0)
NEUT#: 5.3 10*3/uL (ref 1.5–6.5)
NEUT%: 81.4 % — ABNORMAL HIGH (ref 38.4–76.8)
Platelets: 260 10*3/uL (ref 145–400)
RBC: 3.03 10*6/uL — ABNORMAL LOW (ref 3.70–5.45)
RDW: 14.5 % (ref 11.2–14.5)
RETIC %: 1.67 % (ref 0.70–2.10)
RETIC CT ABS: 50.6 10*3/uL (ref 33.70–90.70)
WBC: 6.6 10*3/uL (ref 3.9–10.3)

## 2017-01-06 LAB — COMPREHENSIVE METABOLIC PANEL
ALBUMIN: 2 g/dL — AB (ref 3.5–5.0)
ALT: 19 U/L (ref 0–55)
AST: 39 U/L — AB (ref 5–34)
Alkaline Phosphatase: 121 U/L (ref 40–150)
Anion Gap: 5 mEq/L (ref 3–11)
BUN: 15.5 mg/dL (ref 7.0–26.0)
CALCIUM: 7.7 mg/dL — AB (ref 8.4–10.4)
CHLORIDE: 108 meq/L (ref 98–109)
CO2: 26 mEq/L (ref 22–29)
CREATININE: 0.8 mg/dL (ref 0.6–1.1)
EGFR: 73 mL/min/{1.73_m2} — ABNORMAL LOW (ref >90)
Glucose: 133 mg/dl (ref 70–140)
Potassium: 3.6 mEq/L (ref 3.5–5.1)
Sodium: 140 mEq/L (ref 136–145)
Total Bilirubin: <0.22 mg/dL (ref 0.20–1.20)
Total Protein: 5.8 g/dL — ABNORMAL LOW (ref 6.4–8.3)

## 2017-01-06 MED ORDER — HEPARIN SOD (PORK) LOCK FLUSH 100 UNIT/ML IV SOLN
500.0000 [IU] | INTRAVENOUS | Status: DC | PRN
Start: 2017-01-06 — End: 2017-01-06
  Administered 2017-01-06: 500 [IU]
  Filled 2017-01-06: qty 5

## 2017-01-06 MED ORDER — OCTREOTIDE ACETATE 30 MG IM KIT
30.0000 mg | PACK | Freq: Once | INTRAMUSCULAR | Status: AC
  Administered 2017-01-06: 30 mg via INTRAMUSCULAR
  Filled 2017-01-06: qty 1

## 2017-01-06 MED ORDER — DENOSUMAB 120 MG/1.7ML ~~LOC~~ SOLN
120.0000 mg | Freq: Once | SUBCUTANEOUS | Status: AC
  Administered 2017-01-06: 120 mg via SUBCUTANEOUS
  Filled 2017-01-06: qty 1.7

## 2017-01-06 MED ORDER — SODIUM CHLORIDE 0.9 % IJ SOLN
10.0000 mL | INTRAMUSCULAR | Status: AC | PRN
  Administered 2017-01-06: 10 mL
  Filled 2017-01-06: qty 10

## 2017-01-06 NOTE — Patient Instructions (Signed)
Octreotide injection solution What is this medicine? OCTREOTIDE (ok TREE oh tide) is used to reduce blood levels of growth hormone in patients with a condition called acromegaly. This medicine also reduces flushing and watery diarrhea caused by certain types of cancer. This medicine may be used for other purposes; ask your health care provider or pharmacist if you have questions. COMMON BRAND NAME(S): Sandostatin, Sandostatin LAR What should I tell my health care provider before I take this medicine? They need to know if you have any of these conditions: -gallbladder disease -kidney disease -liver disease -an unusual or allergic reaction to octreotide, other medicines, foods, dyes, or preservatives -pregnant or trying to get pregnant -breast-feeding How should I use this medicine? This medicine is for injection under the skin or into a vein (only in emergency situations). It is usually given by a health care professional in a hospital or clinic setting. If you get this medicine at home, you will be taught how to prepare and give this medicine. Allow the injection solution to come to room temperature before use. Do not warm it artificially. Use exactly as directed. Take your medicine at regular intervals. Do not take your medicine more often than directed. It is important that you put your used needles and syringes in a special sharps container. Do not put them in a trash can. If you do not have a sharps container, call your pharmacist or healthcare provider to get one. Talk to your pediatrician regarding the use of this medicine in children. Special care may be needed. Overdosage: If you think you have taken too much of this medicine contact a poison control center or emergency room at once. NOTE: This medicine is only for you. Do not share this medicine with others. What if I miss a dose? If you miss a dose, take it as soon as you can. If it is almost time for your next dose, take only that  dose. Do not take double or extra doses. What may interact with this medicine? Do not take this medicine with any of the following medications: -cisapride -droperidol -general anesthetics -grepafloxacin -perphenazine -thioridazine This medicine may also interact with the following medications: -bromocriptine -cyclosporine -diuretics -medicines for blood pressure, heart disease, irregular heart beat -medicines for diabetes, including insulin -quinidine This list may not describe all possible interactions. Give your health care provider a list of all the medicines, herbs, non-prescription drugs, or dietary supplements you use. Also tell them if you smoke, drink alcohol, or use illegal drugs. Some items may interact with your medicine. What should I watch for while using this medicine? Visit your doctor or health care professional for regular checks on your progress. To help reduce irritation at the injection site, use a different site for each injection and make sure the solution is at room temperature before use. This medicine may cause increases or decreases in blood sugar. Signs of high blood sugar include frequent urination, unusual thirst, flushed or dry skin, difficulty breathing, drowsiness, stomach ache, nausea, vomiting or dry mouth. Signs of low blood sugar include chills, cool, pale skin or cold sweats, drowsiness, extreme hunger, fast heartbeat, headache, nausea, nervousness or anxiety, shakiness, trembling, unsteadiness, tiredness, or weakness. Contact your doctor or health care professional right away if you experience any of these symptoms. What side effects may I notice from receiving this medicine? Side effects that you should report to your doctor or health care professional as soon as possible: -allergic reactions like skin rash, itching or hives, swelling   of the face, lips, or tongue -changes in blood sugar -changes in heart rate -severe stomach pain Side effects that  usually do not require medical attention (report to your doctor or health care professional if they continue or are bothersome): -diarrhea or constipation -gas or stomach pain -nausea, vomiting -pain, redness, swelling and irritation at site where injected This list may not describe all possible side effects. Call your doctor for medical advice about side effects. You may report side effects to FDA at 1-800-FDA-1088. Where should I keep my medicine? Keep out of the reach of children. Store in a refrigerator between 2 and 8 degrees C (36 and 46 degrees F). Protect from light. Allow to come to room temperature naturally. Do not use artificial heat. If protected from light, the injection may be stored at room temperature between 20 and 30 degrees C (70 and 86 degrees F) for 14 days. After the initial use, throw away any unused portion of a multiple dose vial after 14 days. Throw away unused portions of the ampules after use. NOTE: This sheet is a summary. It may not cover all possible information. If you have questions about this medicine, talk to your doctor, pharmacist, or health care provider.  2017 Elsevier/Gold Standard (2008-06-17 16:56:04) Denosumab injection What is this medicine? DENOSUMAB (den oh sue mab) slows bone breakdown. Prolia is used to treat osteoporosis in women after menopause and in men. Delton See is used to prevent bone fractures and other bone problems caused by cancer bone metastases. Delton See is also used to treat giant cell tumor of the bone. This medicine may be used for other purposes; ask your health care provider or pharmacist if you have questions. What should I tell my health care provider before I take this medicine? They need to know if you have any of these conditions: -dental disease -eczema -infection or history of infections -kidney disease or on dialysis -low blood calcium or vitamin D -malabsorption syndrome -scheduled to have surgery or tooth  extraction -taking medicine that contains denosumab -thyroid or parathyroid disease -an unusual reaction to denosumab, other medicines, foods, dyes, or preservatives -pregnant or trying to get pregnant -breast-feeding How should I use this medicine? This medicine is for injection under the skin. It is given by a health care professional in a hospital or clinic setting. If you are getting Prolia, a special MedGuide will be given to you by the pharmacist with each prescription and refill. Be sure to read this information carefully each time. For Prolia, talk to your pediatrician regarding the use of this medicine in children. Special care may be needed. For Delton See, talk to your pediatrician regarding the use of this medicine in children. While this drug may be prescribed for children as young as 13 years for selected conditions, precautions do apply. Overdosage: If you think you have taken too much of this medicine contact a poison control center or emergency room at once. NOTE: This medicine is only for you. Do not share this medicine with others. What if I miss a dose? It is important not to miss your dose. Call your doctor or health care professional if you are unable to keep an appointment. What may interact with this medicine? Do not take this medicine with any of the following medications: -other medicines containing denosumab This medicine may also interact with the following medications: -medicines that suppress the immune system -medicines that treat cancer -steroid medicines like prednisone or cortisone This list may not describe all possible interactions. Give  your health care provider a list of all the medicines, herbs, non-prescription drugs, or dietary supplements you use. Also tell them if you smoke, drink alcohol, or use illegal drugs. Some items may interact with your medicine. What should I watch for while using this medicine? Visit your doctor or health care professional for  regular checks on your progress. Your doctor or health care professional may order blood tests and other tests to see how you are doing. Call your doctor or health care professional if you get a cold or other infection while receiving this medicine. Do not treat yourself. This medicine may decrease your body's ability to fight infection. You should make sure you get enough calcium and vitamin D while you are taking this medicine, unless your doctor tells you not to. Discuss the foods you eat and the vitamins you take with your health care professional. See your dentist regularly. Brush and floss your teeth as directed. Before you have any dental work done, tell your dentist you are receiving this medicine. Do not become pregnant while taking this medicine or for 5 months after stopping it. Women should inform their doctor if they wish to become pregnant or think they might be pregnant. There is a potential for serious side effects to an unborn child. Talk to your health care professional or pharmacist for more information. What side effects may I notice from receiving this medicine? Side effects that you should report to your doctor or health care professional as soon as possible: -allergic reactions like skin rash, itching or hives, swelling of the face, lips, or tongue -breathing problems -chest pain -fast, irregular heartbeat -feeling faint or lightheaded, falls -fever, chills, or any other sign of infection -muscle spasms, tightening, or twitches -numbness or tingling -skin blisters or bumps, or is dry, peels, or red -slow healing or unexplained pain in the mouth or jaw -unusual bleeding or bruising Side effects that usually do not require medical attention (Report these to your doctor or health care professional if they continue or are bothersome.): -muscle pain -stomach upset, gas This list may not describe all possible side effects. Call your doctor for medical advice about side effects.  You may report side effects to FDA at 1-800-FDA-1088. Where should I keep my medicine? This medicine is only given in a clinic, doctor's office, or other health care setting and will not be stored at home. NOTE: This sheet is a summary. It may not cover all possible information. If you have questions about this medicine, talk to your doctor, pharmacist, or health care provider.    2016, Elsevier/Gold Standard. (2012-05-21 12:37:47)

## 2017-01-11 ENCOUNTER — Other Ambulatory Visit: Payer: Self-pay | Admitting: *Deleted

## 2017-01-11 DIAGNOSIS — R197 Diarrhea, unspecified: Secondary | ICD-10-CM

## 2017-01-11 MED ORDER — MAGNESIUM OXIDE 400 (241.3 MG) MG PO TABS: 400.0000 mg | ORAL_TABLET | Freq: Two times a day (BID) | ORAL | 0 refills | Status: DC

## 2017-01-13 ENCOUNTER — Other Ambulatory Visit: Payer: Self-pay | Admitting: Nurse Practitioner

## 2017-01-24 ENCOUNTER — Telehealth: Payer: Self-pay | Admitting: *Deleted

## 2017-01-24 NOTE — Telephone Encounter (Signed)
-----   Message from [name], MD sent at 01/24/2017  2:24 PM EST ----- Please CC: Dr. Grier Mitts recommendation regarding Ms. Corprew's care into the record Thanks JMP  ----- Message ----- From: [name], MD Sent: 01/20/2017  12:15 PM To: [name], MD  Thanks for the update Ulice Dash, Its reasonable to hold off for another couple of months to determine what Ms Ratajczak's lung CA status evolves into prior to additional w/u and mx of her Barrett's.  I believe she is getting a rpt CT chest to re-evaluate a pleural mass vs hematoma in march. thx Gautam  ----- Message ----- From: [name], MD Sent: 12/29/2016  12:19 PM To: [name], MD  Daymon Larsen,  I saw Mrs. Follett today in follow-up. Diarrhea and colitis symptoms better. I'm going to wean some of her medicines. Hopefully she will do well.  I wanted your opinion regarding her Barrett's esophagus and history of dysplasia. This is best managed by Dr. Adria Devon who may be able to provide endoscopic therapy if this is a progressive and ongoing process. Other medical issues have prevented her follow-up in [facility]. I think this issue will remain secondary based on the lung cancer. If her lung cancer is progressing then she likely will not need repeat EGD, however if the lung cancer is a stable to improving issue I would like to get her back to [facility]. Thanks for your help and opinion.  Also she is complaining of b/l LE edema which was 2+ today.  I asked that she address this with you or PCP.  Thanks Ulice Dash

## 2017-02-01 ENCOUNTER — Ambulatory Visit (HOSPITAL_COMMUNITY)
Admission: RE | Admit: 2017-02-01 | Discharge: 2017-02-01 | Disposition: A | Discharge status: Home / Self Care | Payer: Medicare Other | Source: Ambulatory Visit | Attending: Hematology | Admitting: Hematology

## 2017-02-01 ENCOUNTER — Encounter (HOSPITAL_COMMUNITY): Payer: Self-pay

## 2017-02-01 DIAGNOSIS — I700 Atherosclerosis of aorta: Secondary | ICD-10-CM | POA: Diagnosis not present

## 2017-02-01 DIAGNOSIS — C3490 Malignant neoplasm of unspecified part of unspecified bronchus or lung: Secondary | ICD-10-CM

## 2017-02-01 DIAGNOSIS — I2510 Atherosclerotic heart disease of native coronary artery without angina pectoris: Secondary | ICD-10-CM | POA: Diagnosis not present

## 2017-02-01 MED ORDER — IOPAMIDOL (ISOVUE-300) INJECTION 61%
75.0000 mL | Freq: Once | INTRAVENOUS | Status: AC | PRN
  Administered 2017-02-01: 75 mL via INTRAVENOUS

## 2017-02-01 MED ORDER — IOPAMIDOL (ISOVUE-300) INJECTION 61%
INTRAVENOUS | Status: AC
  Filled 2017-02-01: qty 75

## 2017-02-02 ENCOUNTER — Other Ambulatory Visit: Payer: Self-pay | Admitting: *Deleted

## 2017-02-02 DIAGNOSIS — C3490 Malignant neoplasm of unspecified part of unspecified bronchus or lung: Secondary | ICD-10-CM

## 2017-02-03 ENCOUNTER — Other Ambulatory Visit: Payer: Medicare Other

## 2017-02-03 ENCOUNTER — Other Ambulatory Visit (HOSPITAL_BASED_OUTPATIENT_CLINIC_OR_DEPARTMENT_OTHER): Payer: Medicare Other

## 2017-02-03 ENCOUNTER — Ambulatory Visit (HOSPITAL_BASED_OUTPATIENT_CLINIC_OR_DEPARTMENT_OTHER): Payer: Medicare Other

## 2017-02-03 VITALS — BP 159/83 | HR 72 | Temp 97.9°F | Resp 18

## 2017-02-03 DIAGNOSIS — C7951 Secondary malignant neoplasm of bone: Secondary | ICD-10-CM

## 2017-02-03 DIAGNOSIS — C3490 Malignant neoplasm of unspecified part of unspecified bronchus or lung: Secondary | ICD-10-CM

## 2017-02-03 DIAGNOSIS — C3491 Malignant neoplasm of unspecified part of right bronchus or lung: Secondary | ICD-10-CM | POA: Diagnosis not present

## 2017-02-03 DIAGNOSIS — Z95828 Presence of other vascular implants and grafts: Secondary | ICD-10-CM

## 2017-02-03 LAB — COMPREHENSIVE METABOLIC PANEL
ALT: 24 U/L (ref 0–55)
AST: 29 U/L (ref 5–34)
Albumin: 2.8 g/dL — ABNORMAL LOW (ref 3.5–5.0)
Alkaline Phosphatase: 91 U/L (ref 40–150)
Anion Gap: 6 mEq/L (ref 3–11)
BUN: 16.9 mg/dL (ref 7.0–26.0)
CALCIUM: 8.8 mg/dL (ref 8.4–10.4)
CHLORIDE: 107 meq/L (ref 98–109)
CO2: 26 mEq/L (ref 22–29)
Creatinine: 0.8 mg/dL (ref 0.6–1.1)
EGFR: 71 mL/min/{1.73_m2} — AB (ref >90)
GLUCOSE: 90 mg/dL (ref 70–140)
POTASSIUM: 4.1 meq/L (ref 3.5–5.1)
SODIUM: 139 meq/L (ref 136–145)
Total Bilirubin: 0.23 mg/dL (ref 0.20–1.20)
Total Protein: 6.8 g/dL (ref 6.4–8.3)

## 2017-02-03 LAB — CBC WITH DIFFERENTIAL/PLATELET
BASO%: 1 % (ref 0.0–2.0)
BASOS ABS: 0.1 10*3/uL (ref 0.0–0.1)
EOS%: 0.8 % (ref 0.0–7.0)
Eosinophils Absolute: 0 10*3/uL (ref 0.0–0.5)
HCT: 29.8 % — ABNORMAL LOW (ref 34.8–46.6)
HGB: 9.6 g/dL — ABNORMAL LOW (ref 11.6–15.9)
LYMPH%: 19.6 % (ref 14.0–49.7)
MCH: 29.3 pg (ref 25.1–34.0)
MCHC: 32.3 g/dL (ref 31.5–36.0)
MCV: 90.6 fL (ref 79.5–101.0)
MONO#: 0.4 10*3/uL (ref 0.1–0.9)
MONO%: 7.6 % (ref 0.0–14.0)
NEUT#: 4 10*3/uL (ref 1.5–6.5)
NEUT%: 71 % (ref 38.4–76.8)
Platelets: 254 10*3/uL (ref 145–400)
RBC: 3.29 10*6/uL — ABNORMAL LOW (ref 3.70–5.45)
RDW: 13.7 % (ref 11.2–14.5)
WBC: 5.6 10*3/uL (ref 3.9–10.3)
lymph#: 1.1 10*3/uL (ref 0.9–3.3)

## 2017-02-03 MED ORDER — OCTREOTIDE ACETATE 30 MG IM KIT
30.0000 mg | PACK | Freq: Once | INTRAMUSCULAR | Status: AC
  Administered 2017-02-03: 30 mg via INTRAMUSCULAR
  Filled 2017-02-03: qty 1

## 2017-02-03 MED ORDER — HEPARIN SOD (PORK) LOCK FLUSH 100 UNIT/ML IV SOLN
500.0000 [IU] | INTRAVENOUS | Status: AC | PRN
  Administered 2017-02-03: 500 [IU]
  Filled 2017-02-03: qty 5

## 2017-02-03 MED ORDER — DENOSUMAB 120 MG/1.7ML ~~LOC~~ SOLN
120.0000 mg | Freq: Once | SUBCUTANEOUS | Status: AC
  Administered 2017-02-03: 120 mg via SUBCUTANEOUS
  Filled 2017-02-03: qty 1.7

## 2017-02-03 MED ORDER — SODIUM CHLORIDE 0.9 % IJ SOLN
10.0000 mL | INTRAMUSCULAR | Status: AC | PRN
  Administered 2017-02-03: 10 mL
  Filled 2017-02-03: qty 10

## 2017-02-03 NOTE — Patient Instructions (Signed)
Octreotide injection solution What is this medicine? OCTREOTIDE (ok TREE oh tide) is used to reduce blood levels of growth hormone in patients with a condition called acromegaly. This medicine also reduces flushing and watery diarrhea caused by certain types of cancer. This medicine may be used for other purposes; ask your health care provider or pharmacist if you have questions. COMMON BRAND NAME(S): Sandostatin, Sandostatin LAR What should I tell my health care provider before I take this medicine? They need to know if you have any of these conditions: -gallbladder disease -kidney disease -liver disease -an unusual or allergic reaction to octreotide, other medicines, foods, dyes, or preservatives -pregnant or trying to get pregnant -breast-feeding How should I use this medicine? This medicine is for injection under the skin or into a vein (only in emergency situations). It is usually given by a health care professional in a hospital or clinic setting. If you get this medicine at home, you will be taught how to prepare and give this medicine. Allow the injection solution to come to room temperature before use. Do not warm it artificially. Use exactly as directed. Take your medicine at regular intervals. Do not take your medicine more often than directed. It is important that you put your used needles and syringes in a special sharps container. Do not put them in a trash can. If you do not have a sharps container, call your pharmacist or healthcare provider to get one. Talk to your pediatrician regarding the use of this medicine in children. Special care may be needed. Overdosage: If you think you have taken too much of this medicine contact a poison control center or emergency room at once. NOTE: This medicine is only for you. Do not share this medicine with others. What if I miss a dose? If you miss a dose, take it as soon as you can. If it is almost time for your next dose, take only that  dose. Do not take double or extra doses. What may interact with this medicine? Do not take this medicine with any of the following medications: -cisapride -droperidol -general anesthetics -grepafloxacin -perphenazine -thioridazine This medicine may also interact with the following medications: -bromocriptine -cyclosporine -diuretics -medicines for blood pressure, heart disease, irregular heart beat -medicines for diabetes, including insulin -quinidine This list may not describe all possible interactions. Give your health care provider a list of all the medicines, herbs, non-prescription drugs, or dietary supplements you use. Also tell them if you smoke, drink alcohol, or use illegal drugs. Some items may interact with your medicine. What should I watch for while using this medicine? Visit your doctor or health care professional for regular checks on your progress. To help reduce irritation at the injection site, use a different site for each injection and make sure the solution is at room temperature before use. This medicine may cause increases or decreases in blood sugar. Signs of high blood sugar include frequent urination, unusual thirst, flushed or dry skin, difficulty breathing, drowsiness, stomach ache, nausea, vomiting or dry mouth. Signs of low blood sugar include chills, cool, pale skin or cold sweats, drowsiness, extreme hunger, fast heartbeat, headache, nausea, nervousness or anxiety, shakiness, trembling, unsteadiness, tiredness, or weakness. Contact your doctor or health care professional right away if you experience any of these symptoms. What side effects may I notice from receiving this medicine? Side effects that you should report to your doctor or health care professional as soon as possible: -allergic reactions like skin rash, itching or hives, swelling   of the face, lips, or tongue -changes in blood sugar -changes in heart rate -severe stomach pain Side effects that  usually do not require medical attention (report to your doctor or health care professional if they continue or are bothersome): -diarrhea or constipation -gas or stomach pain -nausea, vomiting -pain, redness, swelling and irritation at site where injected This list may not describe all possible side effects. Call your doctor for medical advice about side effects. You may report side effects to FDA at 1-800-FDA-1088. Where should I keep my medicine? Keep out of the reach of children. Store in a refrigerator between 2 and 8 degrees C (36 and 46 degrees F). Protect from light. Allow to come to room temperature naturally. Do not use artificial heat. If protected from light, the injection may be stored at room temperature between 20 and 30 degrees C (70 and 86 degrees F) for 14 days. After the initial use, throw away any unused portion of a multiple dose vial after 14 days. Throw away unused portions of the ampules after use. NOTE: This sheet is a summary. It may not cover all possible information. If you have questions about this medicine, talk to your doctor, pharmacist, or health care provider.  2018 Elsevier/Gold Standard (2008-06-17 16:56:04)  Denosumab injection What is this medicine? DENOSUMAB (den oh sue mab) slows bone breakdown. Prolia is used to treat osteoporosis in women after menopause and in men. Delton See is used to treat a high calcium level due to cancer and to prevent bone fractures and other bone problems caused by multiple myeloma or cancer bone metastases. Delton See is also used to treat giant cell tumor of the bone. This medicine may be used for other purposes; ask your health care provider or pharmacist if you have questions. COMMON BRAND NAME(S): Prolia, XGEVA What should I tell my health care provider before I take this medicine? They need to know if you have any of these conditions: -dental disease -having surgery or tooth extraction -infection -kidney disease -low levels of  calcium or Vitamin D in the blood -malnutrition -on hemodialysis -skin conditions or sensitivity -thyroid or parathyroid disease -an unusual reaction to denosumab, other medicines, foods, dyes, or preservatives -pregnant or trying to get pregnant -breast-feeding How should I use this medicine? This medicine is for injection under the skin. It is given by a health care professional in a hospital or clinic setting. If you are getting Prolia, a special MedGuide will be given to you by the pharmacist with each prescription and refill. Be sure to read this information carefully each time. For Prolia, talk to your pediatrician regarding the use of this medicine in children. Special care may be needed. For Delton See, talk to your pediatrician regarding the use of this medicine in children. While this drug may be prescribed for children as young as 13 years for selected conditions, precautions do apply. Overdosage: If you think you have taken too much of this medicine contact a poison control center or emergency room at once. NOTE: This medicine is only for you. Do not share this medicine with others. What if I miss a dose? It is important not to miss your dose. Call your doctor or health care professional if you are unable to keep an appointment. What may interact with this medicine? Do not take this medicine with any of the following medications: -other medicines containing denosumab This medicine may also interact with the following medications: -medicines that lower your chance of fighting infection -steroid medicines like prednisone  or cortisone This list may not describe all possible interactions. Give your health care provider a list of all the medicines, herbs, non-prescription drugs, or dietary supplements you use. Also tell them if you smoke, drink alcohol, or use illegal drugs. Some items may interact with your medicine. What should I watch for while using this medicine? Visit your doctor or  health care professional for regular checks on your progress. Your doctor or health care professional may order blood tests and other tests to see how you are doing. Call your doctor or health care professional for advice if you get a fever, chills or sore throat, or other symptoms of a cold or flu. Do not treat yourself. This drug may decrease your body's ability to fight infection. Try to avoid being around people who are sick. You should make sure you get enough calcium and vitamin D while you are taking this medicine, unless your doctor tells you not to. Discuss the foods you eat and the vitamins you take with your health care professional. See your dentist regularly. Brush and floss your teeth as directed. Before you have any dental work done, tell your dentist you are receiving this medicine. Do not become pregnant while taking this medicine or for 5 months after stopping it. Talk with your doctor or health care professional about your birth control options while taking this medicine. Women should inform their doctor if they wish to become pregnant or think they might be pregnant. There is a potential for serious side effects to an unborn child. Talk to your health care professional or pharmacist for more information. What side effects may I notice from receiving this medicine? Side effects that you should report to your doctor or health care professional as soon as possible: -allergic reactions like skin rash, itching or hives, swelling of the face, lips, or tongue -bone pain -breathing problems -dizziness -jaw pain, especially after dental work -redness, blistering, peeling of the skin -signs and symptoms of infection like fever or chills; cough; sore throat; pain or trouble passing urine -signs of low calcium like fast heartbeat, muscle cramps or muscle pain; pain, tingling, numbness in the hands or feet; seizures -unusual bleeding or bruising -unusually weak or tired Side effects that  usually do not require medical attention (report to your doctor or health care professional if they continue or are bothersome): -constipation -diarrhea -headache -joint pain -loss of appetite -muscle pain -runny nose -tiredness -upset stomach This list may not describe all possible side effects. Call your doctor for medical advice about side effects. You may report side effects to FDA at 1-800-FDA-1088. Where should I keep my medicine? This medicine is only given in a clinic, doctor's office, or other health care setting and will not be stored at home. NOTE: This sheet is a summary. It may not cover all possible information. If you have questions about this medicine, talk to your doctor, pharmacist, or health care provider.  2018 Elsevier/Gold Standard (2016-12-13 19:17:21)   

## 2017-02-07 ENCOUNTER — Other Ambulatory Visit: Payer: Medicare Other

## 2017-02-14 ENCOUNTER — Other Ambulatory Visit: Payer: Self-pay | Admitting: *Deleted

## 2017-02-14 ENCOUNTER — Ambulatory Visit (HOSPITAL_BASED_OUTPATIENT_CLINIC_OR_DEPARTMENT_OTHER): Payer: Medicare Other | Admitting: Hematology

## 2017-02-14 ENCOUNTER — Encounter: Payer: Self-pay | Admitting: Hematology

## 2017-02-14 ENCOUNTER — Other Ambulatory Visit (HOSPITAL_BASED_OUTPATIENT_CLINIC_OR_DEPARTMENT_OTHER): Payer: Medicare Other

## 2017-02-14 VITALS — BP 171/79 | HR 84 | Temp 97.7°F | Resp 16 | Wt 113.9 lb

## 2017-02-14 DIAGNOSIS — C3490 Malignant neoplasm of unspecified part of unspecified bronchus or lung: Secondary | ICD-10-CM

## 2017-02-14 DIAGNOSIS — R197 Diarrhea, unspecified: Secondary | ICD-10-CM | POA: Diagnosis not present

## 2017-02-14 DIAGNOSIS — I2699 Other pulmonary embolism without acute cor pulmonale: Secondary | ICD-10-CM

## 2017-02-14 DIAGNOSIS — I82409 Acute embolism and thrombosis of unspecified deep veins of unspecified lower extremity: Secondary | ICD-10-CM | POA: Diagnosis not present

## 2017-02-14 LAB — CBC & DIFF AND RETIC
BASO%: 1 % (ref 0.0–2.0)
Basophils Absolute: 0.1 10*3/uL (ref 0.0–0.1)
EOS ABS: 0.1 10*3/uL (ref 0.0–0.5)
EOS%: 0.8 % (ref 0.0–7.0)
HEMATOCRIT: 33.9 % — AB (ref 34.8–46.6)
HGB: 10.8 g/dL — ABNORMAL LOW (ref 11.6–15.9)
IMMATURE RETIC FRACT: 13.6 % — AB (ref 1.60–10.00)
LYMPH#: 1.5 10*3/uL (ref 0.9–3.3)
LYMPH%: 24 % (ref 14.0–49.7)
MCH: 28.4 pg (ref 25.1–34.0)
MCHC: 31.9 g/dL (ref 31.5–36.0)
MCV: 89.2 fL (ref 79.5–101.0)
MONO#: 0.3 10*3/uL (ref 0.1–0.9)
MONO%: 5.1 % (ref 0.0–14.0)
NEUT%: 69.1 % (ref 38.4–76.8)
NEUTROS ABS: 4.2 10*3/uL (ref 1.5–6.5)
PLATELETS: 273 10*3/uL (ref 145–400)
RBC: 3.8 10*6/uL (ref 3.70–5.45)
RDW: 13.7 % (ref 11.2–14.5)
RETIC %: 1.78 % (ref 0.70–2.10)
RETIC CT ABS: 67.64 10*3/uL (ref 33.70–90.70)
WBC: 6.1 10*3/uL (ref 3.9–10.3)
nRBC: 0 % (ref 0–0)

## 2017-02-14 LAB — COMPREHENSIVE METABOLIC PANEL
ALT: 25 U/L (ref 0–55)
ANION GAP: 8 meq/L (ref 3–11)
AST: 30 U/L (ref 5–34)
Albumin: 3.1 g/dL — ABNORMAL LOW (ref 3.5–5.0)
Alkaline Phosphatase: 81 U/L (ref 40–150)
BILIRUBIN TOTAL: 0.26 mg/dL (ref 0.20–1.20)
BUN: 16.2 mg/dL (ref 7.0–26.0)
CHLORIDE: 110 meq/L — AB (ref 98–109)
CO2: 24 meq/L (ref 22–29)
CREATININE: 0.8 mg/dL (ref 0.6–1.1)
Calcium: 8.5 mg/dL (ref 8.4–10.4)
EGFR: 77 mL/min/{1.73_m2} — ABNORMAL LOW (ref >90)
Glucose: 116 mg/dl (ref 70–140)
Potassium: 3.4 mEq/L — ABNORMAL LOW (ref 3.5–5.1)
Sodium: 142 mEq/L (ref 136–145)
TOTAL PROTEIN: 7.6 g/dL (ref 6.4–8.3)

## 2017-02-14 LAB — MAGNESIUM: Magnesium: 1.5 mg/dl (ref 1.5–2.5)

## 2017-02-14 MED ORDER — POTASSIUM CHLORIDE CRYS ER 20 MEQ PO TBCR: 20.0000 meq | EXTENDED_RELEASE_TABLET | Freq: Every day | ORAL | 1 refills | Status: DC

## 2017-02-14 MED ORDER — DIPHENOXYLATE-ATROPINE 2.5-0.025 MG PO TABS: 1.0000 | ORAL_TABLET | Freq: Four times a day (QID) | ORAL | 0 refills | Status: DC | PRN

## 2017-02-15 LAB — PHOSPHORUS: PHOSPHORUS: 2.8 mg/dL (ref 2.5–4.5)

## 2017-02-21 ENCOUNTER — Other Ambulatory Visit: Payer: Self-pay | Admitting: *Deleted

## 2017-02-21 MED ORDER — POTASSIUM CHLORIDE CRYS ER 20 MEQ PO TBCR: 20.0000 meq | EXTENDED_RELEASE_TABLET | Freq: Every day | ORAL | 1 refills | Status: DC

## 2017-02-22 ENCOUNTER — Other Ambulatory Visit: Payer: Self-pay | Admitting: *Deleted

## 2017-02-22 DIAGNOSIS — R197 Diarrhea, unspecified: Secondary | ICD-10-CM

## 2017-02-22 MED ORDER — MAGNESIUM OXIDE 400 (241.3 MG) MG PO TABS: 400.0000 mg | ORAL_TABLET | Freq: Two times a day (BID) | ORAL | 0 refills | Status: DC

## 2017-02-27 ENCOUNTER — Telehealth: Payer: Self-pay | Admitting: Hematology

## 2017-02-27 NOTE — Progress Notes (Signed)
Cindy Byrd    HEMATOLOGY ONCOLOGY PROGRESS NOTE  Date of service: .02/14/2017  Patient Care Team: Tamsen Roers, MD as PCP - General (Family Medicine) [name], MD as Consulting Physician (Hematology and Oncology)  CHIEF COMPLAINTS/PURPOSE OF CONSULTATION: Follow-up for metastatic lung cancer  DIAGNOSIS:   #1 Metastatic non-small cell lung cancer with bilateral lung nodules and large metastatic lesion in the left Ilium. #2 Barrett's esophagus with some evidence of intramucosal adenocarcinoma of the esophagus. 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 Also had c diff colitis - treated   Current Treatment  1) Active surveillance 2) Xgeva 158m [place] q4weeks for bone metastases.  Previous Treatment  1 Palliative radiation therapy to the large left ilium metastases 2. IV Nivolumab x 20ycles (discontinued due to likely immune colitis) 3. Xgeva 1245mSC q4weeks for bone metastases.   HISTORY OF PRESENTING ILLNESS: (plz see my previous consultation for details of initial presentation)  INTERVAL HISTORY  Cindy Byrd here for her scheduled followup for metastatic lung cancer. She notes her diarrhea is much better controlled and that she only has 1-2 bowel movements a day. No Fevers or chills. Notes that her appetite had improved significantly. She is off cholestyramine now and is tapering her steroids. No abdominal pain. No GI bleeding. She notes her breathing is stable. Having issues with her significant other and notes that she wants to separate. Appears to be more comfortable emotionally on this visit. CT chest on 02/01/2017 shows Residual irregular soft tissue thickening/volume loss and trace left pleural fluid at the base of the left hemithorax, overall improved in appearance from 12/08/2016. No measurable lesion.  Patient notes no other acute focal symptoms.  MEDICAL HISTORY:  Past Medical  History:  Diagnosis Date  . Barrett's esophagus   . Bone neoplasm 06/24/2015  . [facility]   metastatic poorly differentiated carcinoma. tumor left groin surgical removal with radiation tx.  . Cataract    BILATERAL  . Cigarette smoker two packs a day or less    Currently still smoking 2 PPD - Not interested in quitting at this time.  . Colitis 2017  . Colon polyps    hyperplastic, tubular adenomas, tubulovillous adenoma  . Cough, persistent    hx. lung cancer ? primary-being evaluated, unsure of primary site.  . Depression 06/24/2015  . Diverticulosis   . Emphysema of lung ([facility]  . Endometriosis    Hysterectomy with BSO at age [age]rs  . Esophageal adenocarcinoma ([facility]   intramucosal  . Gastritis   . GERD (gastroesophageal reflux disease)   . H/O: pneumonia   . Hiatal hernia   . Hyperlipidemia   . Hypertension 06/24/2015   likely improved incidental to 40 lbs weight loss from her neoplasm. No Longer taking med for this as of 08-06-15  . IBS (irritable bowel syndrome)   . Pain    left hip-persistent"tumor of bone"-radiation tx. 10.  . Vitamin D deficiency disease    SURGICAL HISTORY: Past Surgical History:  Procedure Laterality Date  . BARTHOLIN GLAND CYST EXCISION  52106o ago   Does not want if it was an infected cyst or tumor. Was soon as delivery  . COLONOSCOPY W/ POLYPECTOMY     multiple times - last done 09/2014 per patient.  . ESOPHAGOGASTRODUODENOSCOPY (EGD) WITH PROPOFOL N/A 08/11/2015   Procedure: ESOPHAGOGASTRODUODENOSCOPY (EGD) WITH PROPOFOL;  Surgeon: JaJerene BearsMD;  Location: [facility];  Service: Gastroenterology;  Laterality: N/A;  . GANGLION CYST EXCISION    . KNEE ARTHROSCOPY  age about 21 yrs  . TONSILLECTOMY    . TOTAL ABDOMINAL HYSTERECTOMY W/ BILATERAL SALPINGOOPHORECTOMY  at age 16 yrs   For endometriosis    SOCIAL HISTORY: Social History   Social History  . Marital status: Widowed    Spouse name: N/A  . Number of children: 2  .  Years of education: N/A   Occupational History  . Not on file.   Social History Main Topics  . Smoking status: Former Smoker    Packs/day: 1.00    Years: 60.00    Types: Cigarettes    Quit date: 12/06/2015  . Smokeless tobacco: Never Used  . Alcohol use No  . Drug use: No  . Sexual activity: No   Other Topics Concern  . Not on file   Social History Narrative  . No narrative on file    FAMILY HISTORY: Family History  Problem Relation Age of Onset  . Colon cancer Brother   . Colon cancer Brother   . Stroke Mother   . Colon cancer Father   . Breast cancer Daughter 70    ER/PR+ stage II    ALLERGIES:  is allergic to penicillins; remeron [mirtazapine]; and latex. patient wonders if she has a penicillin allergy but notes that she is uncertain about this.  MEDICATIONS:  Current Outpatient Prescriptions  Medication Sig Dispense Refill  . budesonide (ENTOCORT EC) 3 MG 24 hr capsule Take 3 capsules (9 mg total) by mouth daily. 90 capsule 0  . calcium-vitamin D (OSCAL WITH D) 500-200 MG-UNIT tablet Take 2 tablets by mouth 3 (three) times daily.    . citalopram (CELEXA) 20 MG tablet Take 1 tablet (20 mg total) by mouth daily. 30 tablet 0  . diphenoxylate-atropine (LOMOTIL) 2.5-0.025 MG tablet Take 1-2 tablets by mouth 4 (four) times daily as needed for diarrhea or loose stools. 60 tablet 0  . enoxaparin (LOVENOX) 80 MG/0.8ML injection Inject 0.7 mLs (70 mg total) into the skin daily. 30 Syringe 1  . lidocaine-prilocaine (EMLA) cream Apply small amount over port 1-2 hours prior to treatment, cover with plastic wrap (DO NOT RUB IN). 30 g prn  . liver oil-zinc oxide (DESITIN) 40 % ointment Apply topically daily. 56.7 g 0  . LORazepam (ATIVAN) 1 MG tablet Take 1 tablet (1 mg total) by mouth every 8 (eight) hours as needed for anxiety (or nausea). 15 tablet 0  . mesalamine (LIALDA) 1.2 g EC tablet Take 4 tablets (4.8 g total) by mouth daily with breakfast. 120 tablet 2  . omeprazole  (PRILOSEC) 40 MG capsule TAKE 1 CAPSULE BY MOUTH EVERY DAY 90 capsule 0  . ondansetron (ZOFRAN) 8 MG tablet Take 1 tablet (8 mg total) by mouth every 8 (eight) hours as needed for nausea or vomiting. 60 tablet 0  . oxyCODONE-acetaminophen (PERCOCET/ROXICET) 5-325 MG tablet Take 1 tablet by mouth every 4 (four) hours as needed for severe pain. 15 tablet 0  . PRESCRIPTION MEDICATION Antibody Plan [facility]    . VITAMIN D, ERGOCALCIFEROL, PO Give 0.42m (4,000 units) by mouth once daily    . zolpidem (AMBIEN CR) 12.5 MG CR tablet Take 1 tablet (12.5 mg total) by mouth at bedtime as needed for sleep. 30 tablet 0  . magnesium oxide (MAG-OX) 400 (241.3 Mg) MG tablet Take 1 tablet (400 mg total) by mouth 2 (two) times daily. 60 tablet 0  . potassium chloride SA (  K-DUR,KLOR-CON) 20 MEQ tablet Take 1 tablet (20 mEq total) by mouth daily. 30 tablet 1   No current facility-administered medications for this visit.     REVIEW OF SYSTEMS:    10 point review of systems done and was noted to be negative except as noted above.  PHYSICAL EXAMINATION: ECOG PERFORMANCE STATUS: 2 - Symptomatic, <50% confined to bed  Vitals:   02/14/17 1510  BP: (!) 171/79  Pulse: 84  Resp: 16  Temp: 97.7 F (36.5 C)   Filed Weights   02/14/17 1510  Weight: 113 lb 14.4 oz (51.7 kg)  . Wt Readings from Last 3 Encounters:  02/14/17 113 lb 14.4 oz (51.7 kg)  12/29/16 121 lb (54.9 kg)  12/27/16 120 lb 14.4 oz (54.8 kg)   GENERAL:alert, NAD, appears well. SKIN: skin color, texture, turgor are normal, no rashes or significant lesions EYES: normal, conjunctiva are pink and non-injected, sclera clear OROPHARYNX:no exudate, no erythema and lips, buccal mucosa, and tongue normal  NECK: supple, thyroid normal size, non-tender, without nodularity LYMPH:  no palpable lymphadenopathy in the cervical, axillary or inguinal LUNGS: clear to auscultation and bilateral reduced breath sounds with normal respiratory effort  HEART: regular  rate & rhythm and no murmurs and resolved lower extremity edema ABDOMEN:abdomen soft, non-tender and normal bowel sounds Musculoskeletal: No pedal edema. No calf pain or tenderness.  PSYCH: alert & oriented x 3 with fluent speech NEURO: no focal motor/sensory deficits.  LABORATORY DATA:  I have reviewed the data as listed . CBC Latest Ref Rng & Units 02/14/2017 02/03/2017 01/06/2017  WBC 3.9 - 10.3 10e3/uL 6.1 5.6 6.6  Hemoglobin 11.6 - 15.9 g/dL 10.8(L) 9.6(L) 9.2(L)  Hematocrit 34.8 - 46.6 % 33.9(L) 29.8(L) 28.0(L)  Platelets 145 - 400 10e3/uL 273 254 260   . CMP Latest Ref Rng & Units 02/14/2017 02/03/2017 01/06/2017  Glucose 70 - 140 mg/dl 116 90 133  BUN 7.0 - 26.0 mg/dL 16.2 16.9 15.5  Creatinine 0.6 - 1.1 mg/dL 0.8 0.8 0.8  Sodium 136 - 145 mEq/L 142 139 140  Potassium 3.5 - 5.1 mEq/L 3.4(L) 4.1 3.6  Chloride 96 - 112 mEq/L - - -  CO2 22 - 29 mEq/L _0 Calcium 8.4 - 10.4 mg/dL 8.5 8.8 7.7(L)  Total Protein 6.4 - 8.3 g/dL 7.6 6.8 5.8(L)  Total Bilirubin 0.20 - 1.20 mg/dL 0.26 0.23 <0.22  Alkaline Phos 40 - 150 U/L 81 91 121  AST 5 - 34 U/L 30 29 39(H)  ALT 0 - 55 U/L _1 RADIOGRAPHIC STUDIES: I have personally reviewed the radiological images as listed and agreed with the findings in the report. Ct Chest W Contrast  Result Date: 02/01/2017 CLINICAL DATA:  Fall 2 months ago, left-sided chest pain, lung cancer, chemotherapy complete. Esophageal cancer. EXAM: CT CHEST WITH CONTRAST TECHNIQUE: Multidetector CT imaging of the chest was performed during intravenous contrast administration. CONTRAST:  68m ISOVUE-300 IOPAMIDOL (ISOVUE-300) INJECTION 61% COMPARISON:  12/08/2016. FINDINGS: Cardiovascular: Right IJ Port-A-Cath terminates at the SVC RA junction. Atherosclerotic calcification of the arterial vasculature, including coronary arteries. Pulmonary arteries are enlarged. Heart size within normal limits. No pericardial effusion. Mediastinum/Nodes: No pathologically  enlarged mediastinal, hilar or axillary lymph nodes. There is left hilar soft tissue thickening. Esophagus is grossly unremarkable. Lungs/Pleura: Mild centrilobular emphysema. Minimal linear scarring in the posterior segment right upper lobe. Additional scarring/volume loss in the lung bases. Trace left pleural fluid/thickening, decreased from prior. Airway is unremarkable. Upper  Abdomen: Low-attenuation lesions in the liver measure up to 1.5 cm in segment 4, as before. Visualized portions of the liver, gallbladder, adrenal glands, kidneys, spleen, pancreas, stomach and bowel are otherwise unremarkable. No upper abdominal adenopathy. Musculoskeletal: No worrisome lytic or sclerotic lesions. There are displaced fractures of the left tenth and eleventh posterior ribs with override and minimal evidence of healing. IMPRESSION: 1. Residual irregular soft tissue thickening/volume loss and trace left pleural fluid at the base of the left hemithorax, overall improved in appearance from 12/08/2016. No measurable lesion. 2. Soft tissue thickening in the left hilum may relate to chronic pulmonary embolus, as on prior exams. 3. Enlarged pulmonary arteries, indicative of pulmonary arterial hypertension. 4. Aortic atherosclerosis (ICD10-170.0). Coronary artery calcification. 5. Displaced fractures of the left tenth and eleventh posterior ribs with minimal evidence of healing. Appearance is similar to 12/08/2016. Electronically Signed   By: Lorin Picket M.D.   On: 02/01/2017 10:55    ASSESSMENT & PLAN:   #1 Metastatic poorly differentiated carcinoma with likely lung primary [non-small cell lung cancer].  CT of the head with and without contrast showed no evidence of metastatic disease. Patient notes much improved pain control. EGFR blood test mutation analysis negative. Patient's pain is much better controlled after radiation for the painful ilium met. CT chest abdomen pelvis 04/19/2016 shows no evidence of disease  progression. Patient tolerated Nivolumab very well until recently when she developed grade 2 Immune colitis as a result of which Nivolumab has been discontinued. CT chest abdomen pelvis on 06/24/2016 shows no evidence of new disease or progression of metastatic disease. CT chest abdomen pelvis 09/06/2016 shows 1. Mixed interval response to therapy. 2. There is a new left ventral chest wall lesion deep to the pectoralis musculature worrisome for metastatic disease. 3. Posterior lower lobe nodular densities are identified which may reflect areas of pulmonary metastasis. 4. Interval decrease in size of destructive lesion involving the left iliac bone.  CT chest abd pelvis 12/08/2016: Cystic mass involving the left ventral chest wall has resolved in the interval. Likely was a hematoma due to trauma. Interval increase in size of pleural base mass overlying the posterior and inferior left lower lobe. There is also a new left pleural effusion identified.  CT chest 02/01/2017: Residual irregular soft tissue thickening/volume loss and trace left pleural fluid at the base of the left hemithorax, overall improved in appearance from 12/08/2016. No measurable lesion.  PLAN -No symptoms suggestive of disease progression - CT chest on 02/01/2017 show left pleural irregular soft tissue thickening improved in appearance with no measurable lesion.patient glad with this finding. - patient counseled on fall precautions and encouraged to use her cane and walker as instructed . - she was recommended to focus on optimizing her nutritional status .  #2 diarrhea-  now resolved was previously. grade 2 likely related to immune colitis from her Nivolumab now with c diff colitis (s/p vancomycin) She is currently on Lialda, budesonide,probiotics and lomotil (not using her lomotil much) Plan  -Diarrhea is much better controlled -Continue sandostatin -continue f/u with GI to see if her po steroids, and other medications could be  tapered/stopped.   #3 Hyperkalemia and acidosis on last visit - resolved Now with mild hypokalemia 3.4 Plan -restart potassium replacement. -adequate by mouth fluid intake   #4 DVT and PE  -will need to be on ongoing Lovenox therapy -Monitor for bleeding .  #5 Barrett's esophagus 4cms in the distal esophagus with low and high-grade dysplasia cannot rule out  an early intra-mucosal esophageal adenocarcinoma. Plan -Patient being monitored by Dr. [name] from GI.  Continue LA Octreotide and Marchelle Folks RTC with Dr Irene Limbo with labs in 2 months (04/28/2017)  All questions were answered. The patient knows to call the clinic with any problems, questions or concerns.  Sullivan Lone MD [place] Physician [facility]  (Office): [phone] (Work cell): [phone] (Fax): [phone]

## 2017-02-27 NOTE — Telephone Encounter (Signed)
R/s appt to fit MD appt for 5/24- per Schedule message from [facility]. Patient is aware of appt change and will pick up a new schedule when she comes in on 3/30 .

## 2017-03-01 ENCOUNTER — Other Ambulatory Visit: Payer: Self-pay | Admitting: *Deleted

## 2017-03-01 DIAGNOSIS — I2699 Other pulmonary embolism without acute cor pulmonale: Secondary | ICD-10-CM

## 2017-03-01 MED ORDER — ENOXAPARIN SODIUM 80 MG/0.8ML ~~LOC~~ SOLN: 1.5000 mg/kg | SUBCUTANEOUS | 1 refills | Status: DC

## 2017-03-02 ENCOUNTER — Other Ambulatory Visit: Payer: Self-pay | Admitting: *Deleted

## 2017-03-02 DIAGNOSIS — C3490 Malignant neoplasm of unspecified part of unspecified bronchus or lung: Secondary | ICD-10-CM

## 2017-03-03 ENCOUNTER — Ambulatory Visit (HOSPITAL_BASED_OUTPATIENT_CLINIC_OR_DEPARTMENT_OTHER): Payer: Medicare Other

## 2017-03-03 ENCOUNTER — Telehealth: Payer: Self-pay | Admitting: Hematology

## 2017-03-03 ENCOUNTER — Other Ambulatory Visit (HOSPITAL_BASED_OUTPATIENT_CLINIC_OR_DEPARTMENT_OTHER): Payer: Medicare Other

## 2017-03-03 ENCOUNTER — Other Ambulatory Visit: Payer: Medicare Other

## 2017-03-03 VITALS — BP 170/79 | HR 74 | Temp 98.7°F | Resp 18

## 2017-03-03 DIAGNOSIS — C7951 Secondary malignant neoplasm of bone: Secondary | ICD-10-CM | POA: Diagnosis not present

## 2017-03-03 DIAGNOSIS — Z95828 Presence of other vascular implants and grafts: Secondary | ICD-10-CM

## 2017-03-03 DIAGNOSIS — C3490 Malignant neoplasm of unspecified part of unspecified bronchus or lung: Secondary | ICD-10-CM

## 2017-03-03 DIAGNOSIS — C3491 Malignant neoplasm of unspecified part of right bronchus or lung: Secondary | ICD-10-CM

## 2017-03-03 LAB — COMPREHENSIVE METABOLIC PANEL
ALT: 34 U/L (ref 0–55)
AST: 36 U/L — AB (ref 5–34)
Albumin: 2.9 g/dL — ABNORMAL LOW (ref 3.5–5.0)
Alkaline Phosphatase: 104 U/L (ref 40–150)
Anion Gap: 6 mEq/L (ref 3–11)
BILIRUBIN TOTAL: 0.31 mg/dL (ref 0.20–1.20)
BUN: 14.3 mg/dL (ref 7.0–26.0)
CO2: 25 meq/L (ref 22–29)
Calcium: 8.2 mg/dL — ABNORMAL LOW (ref 8.4–10.4)
Chloride: 109 mEq/L (ref 98–109)
Creatinine: 0.8 mg/dL (ref 0.6–1.1)
EGFR: 77 mL/min/{1.73_m2} — ABNORMAL LOW (ref >90)
GLUCOSE: 89 mg/dL (ref 70–140)
Potassium: 3.8 mEq/L (ref 3.5–5.1)
SODIUM: 140 meq/L (ref 136–145)
TOTAL PROTEIN: 6.8 g/dL (ref 6.4–8.3)

## 2017-03-03 LAB — CBC WITH DIFFERENTIAL/PLATELET
BASO%: 0.7 % (ref 0.0–2.0)
Basophils Absolute: 0 10*3/uL (ref 0.0–0.1)
EOS ABS: 0.1 10*3/uL (ref 0.0–0.5)
EOS%: 1.1 % (ref 0.0–7.0)
HCT: 30.4 % — ABNORMAL LOW (ref 34.8–46.6)
HGB: 9.9 g/dL — ABNORMAL LOW (ref 11.6–15.9)
LYMPH%: 22.9 % (ref 14.0–49.7)
MCH: 28 pg (ref 25.1–34.0)
MCHC: 32.4 g/dL (ref 31.5–36.0)
MCV: 86.3 fL (ref 79.5–101.0)
MONO#: 0.5 10*3/uL (ref 0.1–0.9)
MONO%: 7.4 % (ref 0.0–14.0)
NEUT%: 67.9 % (ref 38.4–76.8)
NEUTROS ABS: 4.5 10*3/uL (ref 1.5–6.5)
Platelets: 213 10*3/uL (ref 145–400)
RBC: 3.52 10*6/uL — AB (ref 3.70–5.45)
RDW: 14.8 % — ABNORMAL HIGH (ref 11.2–14.5)
WBC: 6.6 10*3/uL (ref 3.9–10.3)
lymph#: 1.5 10*3/uL (ref 0.9–3.3)

## 2017-03-03 MED ORDER — DENOSUMAB 120 MG/1.7ML ~~LOC~~ SOLN
120.0000 mg | Freq: Once | SUBCUTANEOUS | Status: AC
  Administered 2017-03-03: 120 mg via SUBCUTANEOUS
  Filled 2017-03-03: qty 1.7

## 2017-03-03 MED ORDER — OCTREOTIDE ACETATE 30 MG IM KIT
30.0000 mg | PACK | Freq: Once | INTRAMUSCULAR | Status: AC
  Administered 2017-03-03: 30 mg via INTRAMUSCULAR
  Filled 2017-03-03: qty 1

## 2017-03-03 MED ORDER — SODIUM CHLORIDE 0.9 % IJ SOLN
10.0000 mL | INTRAMUSCULAR | Status: AC | PRN
  Administered 2017-03-03: 10 mL
  Filled 2017-03-03: qty 10

## 2017-03-03 MED ORDER — HEPARIN SOD (PORK) LOCK FLUSH 100 UNIT/ML IV SOLN
500.0000 [IU] | INTRAVENOUS | Status: AC | PRN
  Administered 2017-03-03: 500 [IU]
  Filled 2017-03-03: qty 5

## 2017-03-03 NOTE — Patient Instructions (Signed)
Octreotide injection solution What is this medicine? OCTREOTIDE (ok TREE oh tide) is used to reduce blood levels of growth hormone in patients with a condition called acromegaly. This medicine also reduces flushing and watery diarrhea caused by certain types of cancer. This medicine may be used for other purposes; ask your health care provider or pharmacist if you have questions. COMMON BRAND NAME(S): Sandostatin, Sandostatin LAR What should I tell my health care provider before I take this medicine? They need to know if you have any of these conditions: -gallbladder disease -kidney disease -liver disease -an unusual or allergic reaction to octreotide, other medicines, foods, dyes, or preservatives -pregnant or trying to get pregnant -breast-feeding How should I use this medicine? This medicine is for injection under the skin or into a vein (only in emergency situations). It is usually given by a health care professional in a hospital or clinic setting. If you get this medicine at home, you will be taught how to prepare and give this medicine. Allow the injection solution to come to room temperature before use. Do not warm it artificially. Use exactly as directed. Take your medicine at regular intervals. Do not take your medicine more often than directed. It is important that you put your used needles and syringes in a special sharps container. Do not put them in a trash can. If you do not have a sharps container, call your pharmacist or healthcare provider to get one. Talk to your pediatrician regarding the use of this medicine in children. Special care may be needed. Overdosage: If you think you have taken too much of this medicine contact a poison control center or emergency room at once. NOTE: This medicine is only for you. Do not share this medicine with others. What if I miss a dose? If you miss a dose, take it as soon as you can. If it is almost time for your next dose, take only that  dose. Do not take double or extra doses. What may interact with this medicine? Do not take this medicine with any of the following medications: -cisapride -droperidol -general anesthetics -grepafloxacin -perphenazine -thioridazine This medicine may also interact with the following medications: -bromocriptine -cyclosporine -diuretics -medicines for blood pressure, heart disease, irregular heart beat -medicines for diabetes, including insulin -quinidine This list may not describe all possible interactions. Give your health care provider a list of all the medicines, herbs, non-prescription drugs, or dietary supplements you use. Also tell them if you smoke, drink alcohol, or use illegal drugs. Some items may interact with your medicine. What should I watch for while using this medicine? Visit your doctor or health care professional for regular checks on your progress. To help reduce irritation at the injection site, use a different site for each injection and make sure the solution is at room temperature before use. This medicine may cause increases or decreases in blood sugar. Signs of high blood sugar include frequent urination, unusual thirst, flushed or dry skin, difficulty breathing, drowsiness, stomach ache, nausea, vomiting or dry mouth. Signs of low blood sugar include chills, cool, pale skin or cold sweats, drowsiness, extreme hunger, fast heartbeat, headache, nausea, nervousness or anxiety, shakiness, trembling, unsteadiness, tiredness, or weakness. Contact your doctor or health care professional right away if you experience any of these symptoms. What side effects may I notice from receiving this medicine? Side effects that you should report to your doctor or health care professional as soon as possible: -allergic reactions like skin rash, itching or hives, swelling   of the face, lips, or tongue -changes in blood sugar -changes in heart rate -severe stomach pain Side effects that  usually do not require medical attention (report to your doctor or health care professional if they continue or are bothersome): -diarrhea or constipation -gas or stomach pain -nausea, vomiting -pain, redness, swelling and irritation at site where injected This list may not describe all possible side effects. Call your doctor for medical advice about side effects. You may report side effects to FDA at 1-800-FDA-1088. Where should I keep my medicine? Keep out of the reach of children. Store in a refrigerator between 2 and 8 degrees C (36 and 46 degrees F). Protect from light. Allow to come to room temperature naturally. Do not use artificial heat. If protected from light, the injection may be stored at room temperature between 20 and 30 degrees C (70 and 86 degrees F) for 14 days. After the initial use, throw away any unused portion of a multiple dose vial after 14 days. Throw away unused portions of the ampules after use. NOTE: This sheet is a summary. It may not cover all possible information. If you have questions about this medicine, talk to your doctor, pharmacist, or health care provider.  2018 Elsevier/Gold Standard (2008-06-17 16:56:04)  Denosumab injection What is this medicine? DENOSUMAB (den oh sue mab) slows bone breakdown. Prolia is used to treat osteoporosis in women after menopause and in men. Delton See is used to treat a high calcium level due to cancer and to prevent bone fractures and other bone problems caused by multiple myeloma or cancer bone metastases. Delton See is also used to treat giant cell tumor of the bone. This medicine may be used for other purposes; ask your health care provider or pharmacist if you have questions. COMMON BRAND NAME(S): Prolia, XGEVA What should I tell my health care provider before I take this medicine? They need to know if you have any of these conditions: -dental disease -having surgery or tooth extraction -infection -kidney disease -low levels of  calcium or Vitamin D in the blood -malnutrition -on hemodialysis -skin conditions or sensitivity -thyroid or parathyroid disease -an unusual reaction to denosumab, other medicines, foods, dyes, or preservatives -pregnant or trying to get pregnant -breast-feeding How should I use this medicine? This medicine is for injection under the skin. It is given by a health care professional in a hospital or clinic setting. If you are getting Prolia, a special MedGuide will be given to you by the pharmacist with each prescription and refill. Be sure to read this information carefully each time. For Prolia, talk to your pediatrician regarding the use of this medicine in children. Special care may be needed. For Delton See, talk to your pediatrician regarding the use of this medicine in children. While this drug may be prescribed for children as young as 13 years for selected conditions, precautions do apply. Overdosage: If you think you have taken too much of this medicine contact a poison control center or emergency room at once. NOTE: This medicine is only for you. Do not share this medicine with others. What if I miss a dose? It is important not to miss your dose. Call your doctor or health care professional if you are unable to keep an appointment. What may interact with this medicine? Do not take this medicine with any of the following medications: -other medicines containing denosumab This medicine may also interact with the following medications: -medicines that lower your chance of fighting infection -steroid medicines like prednisone  or cortisone This list may not describe all possible interactions. Give your health care provider a list of all the medicines, herbs, non-prescription drugs, or dietary supplements you use. Also tell them if you smoke, drink alcohol, or use illegal drugs. Some items may interact with your medicine. What should I watch for while using this medicine? Visit your doctor or  health care professional for regular checks on your progress. Your doctor or health care professional may order blood tests and other tests to see how you are doing. Call your doctor or health care professional for advice if you get a fever, chills or sore throat, or other symptoms of a cold or flu. Do not treat yourself. This drug may decrease your body's ability to fight infection. Try to avoid being around people who are sick. You should make sure you get enough calcium and vitamin D while you are taking this medicine, unless your doctor tells you not to. Discuss the foods you eat and the vitamins you take with your health care professional. See your dentist regularly. Brush and floss your teeth as directed. Before you have any dental work done, tell your dentist you are receiving this medicine. Do not become pregnant while taking this medicine or for 5 months after stopping it. Talk with your doctor or health care professional about your birth control options while taking this medicine. Women should inform their doctor if they wish to become pregnant or think they might be pregnant. There is a potential for serious side effects to an unborn child. Talk to your health care professional or pharmacist for more information. What side effects may I notice from receiving this medicine? Side effects that you should report to your doctor or health care professional as soon as possible: -allergic reactions like skin rash, itching or hives, swelling of the face, lips, or tongue -bone pain -breathing problems -dizziness -jaw pain, especially after dental work -redness, blistering, peeling of the skin -signs and symptoms of infection like fever or chills; cough; sore throat; pain or trouble passing urine -signs of low calcium like fast heartbeat, muscle cramps or muscle pain; pain, tingling, numbness in the hands or feet; seizures -unusual bleeding or bruising -unusually weak or tired Side effects that  usually do not require medical attention (report to your doctor or health care professional if they continue or are bothersome): -constipation -diarrhea -headache -joint pain -loss of appetite -muscle pain -runny nose -tiredness -upset stomach This list may not describe all possible side effects. Call your doctor for medical advice about side effects. You may report side effects to FDA at 1-800-FDA-1088. Where should I keep my medicine? This medicine is only given in a clinic, doctor's office, or other health care setting and will not be stored at home. NOTE: This sheet is a summary. It may not cover all possible information. If you have questions about this medicine, talk to your doctor, pharmacist, or health care provider.  2018 Elsevier/Gold Standard (2016-12-13 19:17:21)   

## 2017-03-03 NOTE — Telephone Encounter (Signed)
Patient stopped by for schedule after injection visit. Gave patient appointment schedule for April and May

## 2017-03-24 ENCOUNTER — Other Ambulatory Visit: Payer: Self-pay | Admitting: Hematology

## 2017-03-24 DIAGNOSIS — R197 Diarrhea, unspecified: Secondary | ICD-10-CM

## 2017-03-30 ENCOUNTER — Other Ambulatory Visit: Payer: Self-pay | Admitting: *Deleted

## 2017-03-30 DIAGNOSIS — C3490 Malignant neoplasm of unspecified part of unspecified bronchus or lung: Secondary | ICD-10-CM

## 2017-03-31 ENCOUNTER — Other Ambulatory Visit (HOSPITAL_BASED_OUTPATIENT_CLINIC_OR_DEPARTMENT_OTHER): Payer: Medicare Other

## 2017-03-31 ENCOUNTER — Other Ambulatory Visit: Payer: Medicare Other

## 2017-03-31 ENCOUNTER — Ambulatory Visit (HOSPITAL_BASED_OUTPATIENT_CLINIC_OR_DEPARTMENT_OTHER): Payer: Medicare Other

## 2017-03-31 VITALS — BP 160/95 | HR 66 | Temp 98.5°F | Resp 20

## 2017-03-31 DIAGNOSIS — C7951 Secondary malignant neoplasm of bone: Secondary | ICD-10-CM

## 2017-03-31 DIAGNOSIS — C3490 Malignant neoplasm of unspecified part of unspecified bronchus or lung: Secondary | ICD-10-CM

## 2017-03-31 DIAGNOSIS — C3491 Malignant neoplasm of unspecified part of right bronchus or lung: Secondary | ICD-10-CM

## 2017-03-31 DIAGNOSIS — Z95828 Presence of other vascular implants and grafts: Secondary | ICD-10-CM

## 2017-03-31 LAB — CBC WITH DIFFERENTIAL/PLATELET
BASO%: 0.7 % (ref 0.0–2.0)
Basophils Absolute: 0.1 10*3/uL (ref 0.0–0.1)
EOS%: 1.2 % (ref 0.0–7.0)
Eosinophils Absolute: 0.1 10*3/uL (ref 0.0–0.5)
HCT: 34 % — ABNORMAL LOW (ref 34.8–46.6)
HGB: 10.6 g/dL — ABNORMAL LOW (ref 11.6–15.9)
LYMPH%: 22.9 % (ref 14.0–49.7)
MCH: 27.2 pg (ref 25.1–34.0)
MCHC: 31.2 g/dL — ABNORMAL LOW (ref 31.5–36.0)
MCV: 87.2 fL (ref 79.5–101.0)
MONO#: 0.5 10*3/uL (ref 0.1–0.9)
MONO%: 6.9 % (ref 0.0–14.0)
NEUT%: 68.3 % (ref 38.4–76.8)
NEUTROS ABS: 4.6 10*3/uL (ref 1.5–6.5)
PLATELETS: 224 10*3/uL (ref 145–400)
RBC: 3.9 10*6/uL (ref 3.70–5.45)
RDW: 15.5 % — AB (ref 11.2–14.5)
WBC: 6.7 10*3/uL (ref 3.9–10.3)
lymph#: 1.5 10*3/uL (ref 0.9–3.3)

## 2017-03-31 LAB — COMPREHENSIVE METABOLIC PANEL
ALT: 54 U/L (ref 0–55)
ANION GAP: 8 meq/L (ref 3–11)
AST: 55 U/L — ABNORMAL HIGH (ref 5–34)
Albumin: 3.3 g/dL — ABNORMAL LOW (ref 3.5–5.0)
Alkaline Phosphatase: 131 U/L (ref 40–150)
BUN: 14.6 mg/dL (ref 7.0–26.0)
CO2: 26 meq/L (ref 22–29)
Calcium: 8.9 mg/dL (ref 8.4–10.4)
Chloride: 105 mEq/L (ref 98–109)
Creatinine: 0.8 mg/dL (ref 0.6–1.1)
EGFR: 74 mL/min/{1.73_m2} — AB (ref >90)
Glucose: 86 mg/dl (ref 70–140)
POTASSIUM: 4.1 meq/L (ref 3.5–5.1)
Sodium: 139 mEq/L (ref 136–145)
Total Bilirubin: 0.31 mg/dL (ref 0.20–1.20)
Total Protein: 7.5 g/dL (ref 6.4–8.3)

## 2017-03-31 MED ORDER — OCTREOTIDE ACETATE 30 MG IM KIT
30.0000 mg | PACK | Freq: Once | INTRAMUSCULAR | Status: AC
  Administered 2017-03-31: 30 mg via INTRAMUSCULAR
  Filled 2017-03-31: qty 1

## 2017-03-31 MED ORDER — SODIUM CHLORIDE 0.9 % IJ SOLN
10.0000 mL | INTRAMUSCULAR | Status: AC | PRN
  Administered 2017-03-31: 10 mL
  Filled 2017-03-31: qty 10

## 2017-03-31 MED ORDER — HEPARIN SOD (PORK) LOCK FLUSH 100 UNIT/ML IV SOLN
500.0000 [IU] | INTRAVENOUS | Status: AC | PRN
  Administered 2017-03-31: 500 [IU]
  Filled 2017-03-31: qty 5

## 2017-03-31 MED ORDER — DENOSUMAB 120 MG/1.7ML ~~LOC~~ SOLN
120.0000 mg | Freq: Once | SUBCUTANEOUS | Status: AC
  Administered 2017-03-31: 120 mg via SUBCUTANEOUS
  Filled 2017-03-31: qty 1.7

## 2017-03-31 NOTE — Patient Instructions (Signed)
Octreotide injection solution What is this medicine? OCTREOTIDE (ok TREE oh tide) is used to reduce blood levels of growth hormone in patients with a condition called acromegaly. This medicine also reduces flushing and watery diarrhea caused by certain types of cancer. This medicine may be used for other purposes; ask your health care provider or pharmacist if you have questions. COMMON BRAND NAME(S): Sandostatin, Sandostatin LAR What should I tell my health care provider before I take this medicine? They need to know if you have any of these conditions: -gallbladder disease -kidney disease -liver disease -an unusual or allergic reaction to octreotide, other medicines, foods, dyes, or preservatives -pregnant or trying to get pregnant -breast-feeding How should I use this medicine? This medicine is for injection under the skin or into a vein (only in emergency situations). It is usually given by a health care professional in a hospital or clinic setting. If you get this medicine at home, you will be taught how to prepare and give this medicine. Allow the injection solution to come to room temperature before use. Do not warm it artificially. Use exactly as directed. Take your medicine at regular intervals. Do not take your medicine more often than directed. It is important that you put your used needles and syringes in a special sharps container. Do not put them in a trash can. If you do not have a sharps container, call your pharmacist or healthcare provider to get one. Talk to your pediatrician regarding the use of this medicine in children. Special care may be needed. Overdosage: If you think you have taken too much of this medicine contact a poison control center or emergency room at once. NOTE: This medicine is only for you. Do not share this medicine with others. What if I miss a dose? If you miss a dose, take it as soon as you can. If it is almost time for your next dose, take only that  dose. Do not take double or extra doses. What may interact with this medicine? Do not take this medicine with any of the following medications: -cisapride -droperidol -general anesthetics -grepafloxacin -perphenazine -thioridazine This medicine may also interact with the following medications: -bromocriptine -cyclosporine -diuretics -medicines for blood pressure, heart disease, irregular heart beat -medicines for diabetes, including insulin -quinidine This list may not describe all possible interactions. Give your health care provider a list of all the medicines, herbs, non-prescription drugs, or dietary supplements you use. Also tell them if you smoke, drink alcohol, or use illegal drugs. Some items may interact with your medicine. What should I watch for while using this medicine? Visit your doctor or health care professional for regular checks on your progress. To help reduce irritation at the injection site, use a different site for each injection and make sure the solution is at room temperature before use. This medicine may cause increases or decreases in blood sugar. Signs of high blood sugar include frequent urination, unusual thirst, flushed or dry skin, difficulty breathing, drowsiness, stomach ache, nausea, vomiting or dry mouth. Signs of low blood sugar include chills, cool, pale skin or cold sweats, drowsiness, extreme hunger, fast heartbeat, headache, nausea, nervousness or anxiety, shakiness, trembling, unsteadiness, tiredness, or weakness. Contact your doctor or health care professional right away if you experience any of these symptoms. What side effects may I notice from receiving this medicine? Side effects that you should report to your doctor or health care professional as soon as possible: -allergic reactions like skin rash, itching or hives, swelling  of the face, lips, or tongue -changes in blood sugar -changes in heart rate -severe stomach pain Side effects that  usually do not require medical attention (report to your doctor or health care professional if they continue or are bothersome): -diarrhea or constipation -gas or stomach pain -nausea, vomiting -pain, redness, swelling and irritation at site where injected This list may not describe all possible side effects. Call your doctor for medical advice about side effects. You may report side effects to FDA at 1-800-FDA-1088. Where should I keep my medicine? Keep out of the reach of children. Store in a refrigerator between 2 and 8 degrees C (36 and 46 degrees F). Protect from light. Allow to come to room temperature naturally. Do not use artificial heat. If protected from light, the injection may be stored at room temperature between 20 and 30 degrees C (70 and 86 degrees F) for 14 days. After the initial use, throw away any unused portion of a multiple dose vial after 14 days. Throw away unused portions of the ampules after use. NOTE: This sheet is a summary. It may not cover all possible information. If you have questions about this medicine, talk to your doctor, pharmacist, or health care provider.  2018 Elsevier/Gold Standard (2008-06-17 16:56:04)  Denosumab injection What is this medicine? DENOSUMAB (den oh sue mab) slows bone breakdown. Prolia is used to treat osteoporosis in women after menopause and in men. Delton See is used to treat a high calcium level due to cancer and to prevent bone fractures and other bone problems caused by multiple myeloma or cancer bone metastases. Delton See is also used to treat giant cell tumor of the bone. This medicine may be used for other purposes; ask your health care provider or pharmacist if you have questions. COMMON BRAND NAME(S): Prolia, XGEVA What should I tell my health care provider before I take this medicine? They need to know if you have any of these conditions: -dental disease -having surgery or tooth extraction -infection -kidney disease -low levels of  calcium or Vitamin D in the blood -malnutrition -on hemodialysis -skin conditions or sensitivity -thyroid or parathyroid disease -an unusual reaction to denosumab, other medicines, foods, dyes, or preservatives -pregnant or trying to get pregnant -breast-feeding How should I use this medicine? This medicine is for injection under the skin. It is given by a health care professional in a hospital or clinic setting. If you are getting Prolia, a special MedGuide will be given to you by the pharmacist with each prescription and refill. Be sure to read this information carefully each time. For Prolia, talk to your pediatrician regarding the use of this medicine in children. Special care may be needed. For Delton See, talk to your pediatrician regarding the use of this medicine in children. While this drug may be prescribed for children as young as 13 years for selected conditions, precautions do apply. Overdosage: If you think you have taken too much of this medicine contact a poison control center or emergency room at once. NOTE: This medicine is only for you. Do not share this medicine with others. What if I miss a dose? It is important not to miss your dose. Call your doctor or health care professional if you are unable to keep an appointment. What may interact with this medicine? Do not take this medicine with any of the following medications: -other medicines containing denosumab This medicine may also interact with the following medications: -medicines that lower your chance of fighting infection -steroid medicines like prednisone  or cortisone This list may not describe all possible interactions. Give your health care provider a list of all the medicines, herbs, non-prescription drugs, or dietary supplements you use. Also tell them if you smoke, drink alcohol, or use illegal drugs. Some items may interact with your medicine. What should I watch for while using this medicine? Visit your doctor or  health care professional for regular checks on your progress. Your doctor or health care professional may order blood tests and other tests to see how you are doing. Call your doctor or health care professional for advice if you get a fever, chills or sore throat, or other symptoms of a cold or flu. Do not treat yourself. This drug may decrease your body's ability to fight infection. Try to avoid being around people who are sick. You should make sure you get enough calcium and vitamin D while you are taking this medicine, unless your doctor tells you not to. Discuss the foods you eat and the vitamins you take with your health care professional. See your dentist regularly. Brush and floss your teeth as directed. Before you have any dental work done, tell your dentist you are receiving this medicine. Do not become pregnant while taking this medicine or for 5 months after stopping it. Talk with your doctor or health care professional about your birth control options while taking this medicine. Women should inform their doctor if they wish to become pregnant or think they might be pregnant. There is a potential for serious side effects to an unborn child. Talk to your health care professional or pharmacist for more information. What side effects may I notice from receiving this medicine? Side effects that you should report to your doctor or health care professional as soon as possible: -allergic reactions like skin rash, itching or hives, swelling of the face, lips, or tongue -bone pain -breathing problems -dizziness -jaw pain, especially after dental work -redness, blistering, peeling of the skin -signs and symptoms of infection like fever or chills; cough; sore throat; pain or trouble passing urine -signs of low calcium like fast heartbeat, muscle cramps or muscle pain; pain, tingling, numbness in the hands or feet; seizures -unusual bleeding or bruising -unusually weak or tired Side effects that  usually do not require medical attention (report to your doctor or health care professional if they continue or are bothersome): -constipation -diarrhea -headache -joint pain -loss of appetite -muscle pain -runny nose -tiredness -upset stomach This list may not describe all possible side effects. Call your doctor for medical advice about side effects. You may report side effects to FDA at 1-800-FDA-1088. Where should I keep my medicine? This medicine is only given in a clinic, doctor's office, or other health care setting and will not be stored at home. NOTE: This sheet is a summary. It may not cover all possible information. If you have questions about this medicine, talk to your doctor, pharmacist, or health care provider.  2018 Elsevier/Gold Standard (2016-12-13 19:17:21)

## 2017-04-08 ENCOUNTER — Other Ambulatory Visit: Payer: Self-pay | Admitting: Internal Medicine

## 2017-04-08 DIAGNOSIS — K529 Noninfective gastroenteritis and colitis, unspecified: Secondary | ICD-10-CM

## 2017-04-18 ENCOUNTER — Other Ambulatory Visit: Payer: Self-pay | Admitting: Internal Medicine

## 2017-04-25 ENCOUNTER — Other Ambulatory Visit: Payer: Self-pay | Admitting: Hematology

## 2017-04-25 DIAGNOSIS — R197 Diarrhea, unspecified: Secondary | ICD-10-CM

## 2017-04-26 ENCOUNTER — Other Ambulatory Visit: Payer: Self-pay | Admitting: Nurse Practitioner

## 2017-04-26 ENCOUNTER — Other Ambulatory Visit: Payer: Self-pay | Admitting: Internal Medicine

## 2017-04-27 ENCOUNTER — Ambulatory Visit (HOSPITAL_BASED_OUTPATIENT_CLINIC_OR_DEPARTMENT_OTHER): Payer: Medicare Other

## 2017-04-27 ENCOUNTER — Telehealth: Payer: Self-pay | Admitting: Hematology

## 2017-04-27 ENCOUNTER — Ambulatory Visit (HOSPITAL_BASED_OUTPATIENT_CLINIC_OR_DEPARTMENT_OTHER): Payer: Medicare Other | Admitting: Hematology

## 2017-04-27 ENCOUNTER — Other Ambulatory Visit (HOSPITAL_BASED_OUTPATIENT_CLINIC_OR_DEPARTMENT_OTHER): Payer: Medicare Other

## 2017-04-27 VITALS — BP 185/85 | HR 67 | Temp 97.7°F | Resp 18 | Ht 67.0 in | Wt 122.0 lb

## 2017-04-27 DIAGNOSIS — G893 Neoplasm related pain (acute) (chronic): Secondary | ICD-10-CM | POA: Diagnosis not present

## 2017-04-27 DIAGNOSIS — C3490 Malignant neoplasm of unspecified part of unspecified bronchus or lung: Secondary | ICD-10-CM

## 2017-04-27 DIAGNOSIS — Z95828 Presence of other vascular implants and grafts: Secondary | ICD-10-CM

## 2017-04-27 DIAGNOSIS — R197 Diarrhea, unspecified: Secondary | ICD-10-CM

## 2017-04-27 DIAGNOSIS — C3491 Malignant neoplasm of unspecified part of right bronchus or lung: Secondary | ICD-10-CM

## 2017-04-27 DIAGNOSIS — Z7901 Long term (current) use of anticoagulants: Secondary | ICD-10-CM | POA: Diagnosis not present

## 2017-04-27 DIAGNOSIS — C7951 Secondary malignant neoplasm of bone: Secondary | ICD-10-CM | POA: Diagnosis not present

## 2017-04-27 DIAGNOSIS — Z86718 Personal history of other venous thrombosis and embolism: Secondary | ICD-10-CM

## 2017-04-27 DIAGNOSIS — Z86711 Personal history of pulmonary embolism: Secondary | ICD-10-CM

## 2017-04-27 DIAGNOSIS — I2699 Other pulmonary embolism without acute cor pulmonale: Secondary | ICD-10-CM

## 2017-04-27 LAB — CBC & DIFF AND RETIC
BASO%: 0.7 % (ref 0.0–2.0)
BASOS ABS: 0 10*3/uL (ref 0.0–0.1)
EOS%: 0.9 % (ref 0.0–7.0)
Eosinophils Absolute: 0.1 10*3/uL (ref 0.0–0.5)
HEMATOCRIT: 34.9 % (ref 34.8–46.6)
HGB: 11 g/dL — ABNORMAL LOW (ref 11.6–15.9)
Immature Retic Fract: 2.6 % (ref 1.60–10.00)
LYMPH%: 25.2 % (ref 14.0–49.7)
MCH: 27.8 pg (ref 25.1–34.0)
MCHC: 31.5 g/dL (ref 31.5–36.0)
MCV: 88.1 fL (ref 79.5–101.0)
MONO#: 0.4 10*3/uL (ref 0.1–0.9)
MONO%: 7 % (ref 0.0–14.0)
NEUT#: 3.9 10*3/uL (ref 1.5–6.5)
NEUT%: 66.2 % (ref 38.4–76.8)
Platelets: 182 10*3/uL (ref 145–400)
RBC: 3.96 10*6/uL (ref 3.70–5.45)
RDW: 15.8 % — ABNORMAL HIGH (ref 11.2–14.5)
RETIC CT ABS: 43.96 10*3/uL (ref 33.70–90.70)
Retic %: 1.11 % (ref 0.70–2.10)
WBC: 5.8 10*3/uL (ref 3.9–10.3)
lymph#: 1.5 10*3/uL (ref 0.9–3.3)

## 2017-04-27 LAB — COMPREHENSIVE METABOLIC PANEL
ALBUMIN: 3.4 g/dL — AB (ref 3.5–5.0)
ALT: 48 U/L (ref 0–55)
AST: 37 U/L — AB (ref 5–34)
Alkaline Phosphatase: 119 U/L (ref 40–150)
Anion Gap: 6 mEq/L (ref 3–11)
BILIRUBIN TOTAL: 0.25 mg/dL (ref 0.20–1.20)
BUN: 16.7 mg/dL (ref 7.0–26.0)
CALCIUM: 8.9 mg/dL (ref 8.4–10.4)
CO2: 22 mEq/L (ref 22–29)
CREATININE: 0.8 mg/dL (ref 0.6–1.1)
Chloride: 110 mEq/L — ABNORMAL HIGH (ref 98–109)
EGFR: 72 mL/min/{1.73_m2} — ABNORMAL LOW (ref >90)
Glucose: 90 mg/dl (ref 70–140)
Potassium: 4 mEq/L (ref 3.5–5.1)
SODIUM: 138 meq/L (ref 136–145)
TOTAL PROTEIN: 7.5 g/dL (ref 6.4–8.3)

## 2017-04-27 LAB — MAGNESIUM: Magnesium: 1.9 mg/dl (ref 1.5–2.5)

## 2017-04-27 MED ORDER — HEPARIN SOD (PORK) LOCK FLUSH 100 UNIT/ML IV SOLN
500.0000 [IU] | Freq: Once | INTRAVENOUS | Status: AC
  Administered 2017-04-27: 500 [IU] via INTRAVENOUS
  Filled 2017-04-27: qty 5

## 2017-04-27 MED ORDER — LORAZEPAM 1 MG PO TABS: 1.0000 mg | ORAL_TABLET | Freq: Three times a day (TID) | ORAL | 0 refills | Status: DC | PRN

## 2017-04-27 MED ORDER — DENOSUMAB 120 MG/1.7ML ~~LOC~~ SOLN
120.0000 mg | Freq: Once | SUBCUTANEOUS | Status: AC
  Administered 2017-04-27: 120 mg via SUBCUTANEOUS
  Filled 2017-04-27: qty 1.7

## 2017-04-27 MED ORDER — SODIUM CHLORIDE 0.9% FLUSH
10.0000 mL | INTRAVENOUS | Status: DC | PRN
  Filled 2017-04-27: qty 10

## 2017-04-27 MED ORDER — OCTREOTIDE ACETATE 30 MG IM KIT
30.0000 mg | PACK | Freq: Once | INTRAMUSCULAR | Status: AC
  Administered 2017-04-27: 30 mg via INTRAMUSCULAR
  Filled 2017-04-27: qty 1

## 2017-04-27 MED ORDER — BUDESONIDE 3 MG PO CPEP: ORAL_CAPSULE | ORAL | 0 refills | Status: DC

## 2017-04-27 MED ORDER — OMEPRAZOLE 40 MG PO CPDR: DELAYED_RELEASE_CAPSULE | ORAL | 0 refills | Status: DC

## 2017-04-27 MED ORDER — ENOXAPARIN SODIUM 80 MG/0.8ML ~~LOC~~ SOLN: 80.0000 mg | SUBCUTANEOUS | 1 refills | Status: DC

## 2017-04-27 NOTE — Patient Instructions (Signed)
Octreotide injection solution What is this medicine? OCTREOTIDE (ok TREE oh tide) is used to reduce blood levels of growth hormone in patients with a condition called acromegaly. This medicine also reduces flushing and watery diarrhea caused by certain types of cancer. This medicine may be used for other purposes; ask your health care provider or pharmacist if you have questions. COMMON BRAND NAME(S): Sandostatin, Sandostatin LAR What should I tell my health care provider before I take this medicine? They need to know if you have any of these conditions: -gallbladder disease -kidney disease -liver disease -an unusual or allergic reaction to octreotide, other medicines, foods, dyes, or preservatives -pregnant or trying to get pregnant -breast-feeding How should I use this medicine? This medicine is for injection under the skin or into a vein (only in emergency situations). It is usually given by a health care professional in a hospital or clinic setting. If you get this medicine at home, you will be taught how to prepare and give this medicine. Allow the injection solution to come to room temperature before use. Do not warm it artificially. Use exactly as directed. Take your medicine at regular intervals. Do not take your medicine more often than directed. It is important that you put your used needles and syringes in a special sharps container. Do not put them in a trash can. If you do not have a sharps container, call your pharmacist or healthcare provider to get one. Talk to your pediatrician regarding the use of this medicine in children. Special care may be needed. Overdosage: If you think you have taken too much of this medicine contact a poison control center or emergency room at once. NOTE: This medicine is only for you. Do not share this medicine with others. What if I miss a dose? If you miss a dose, take it as soon as you can. If it is almost time for your next dose, take only that  dose. Do not take double or extra doses. What may interact with this medicine? Do not take this medicine with any of the following medications: -cisapride -droperidol -general anesthetics -grepafloxacin -perphenazine -thioridazine This medicine may also interact with the following medications: -bromocriptine -cyclosporine -diuretics -medicines for blood pressure, heart disease, irregular heart beat -medicines for diabetes, including insulin -quinidine This list may not describe all possible interactions. Give your health care provider a list of all the medicines, herbs, non-prescription drugs, or dietary supplements you use. Also tell them if you smoke, drink alcohol, or use illegal drugs. Some items may interact with your medicine. What should I watch for while using this medicine? Visit your doctor or health care professional for regular checks on your progress. To help reduce irritation at the injection site, use a different site for each injection and make sure the solution is at room temperature before use. This medicine may cause increases or decreases in blood sugar. Signs of high blood sugar include frequent urination, unusual thirst, flushed or dry skin, difficulty breathing, drowsiness, stomach ache, nausea, vomiting or dry mouth. Signs of low blood sugar include chills, cool, pale skin or cold sweats, drowsiness, extreme hunger, fast heartbeat, headache, nausea, nervousness or anxiety, shakiness, trembling, unsteadiness, tiredness, or weakness. Contact your doctor or health care professional right away if you experience any of these symptoms. What side effects may I notice from receiving this medicine? Side effects that you should report to your doctor or health care professional as soon as possible: -allergic reactions like skin rash, itching or hives, swelling  of the face, lips, or tongue -changes in blood sugar -changes in heart rate -severe stomach pain Side effects that  usually do not require medical attention (report to your doctor or health care professional if they continue or are bothersome): -diarrhea or constipation -gas or stomach pain -nausea, vomiting -pain, redness, swelling and irritation at site where injected This list may not describe all possible side effects. Call your doctor for medical advice about side effects. You may report side effects to FDA at 1-800-FDA-1088. Where should I keep my medicine? Keep out of the reach of children. Store in a refrigerator between 2 and 8 degrees C (36 and 46 degrees F). Protect from light. Allow to come to room temperature naturally. Do not use artificial heat. If protected from light, the injection may be stored at room temperature between 20 and 30 degrees C (70 and 86 degrees F) for 14 days. After the initial use, throw away any unused portion of a multiple dose vial after 14 days. Throw away unused portions of the ampules after use. NOTE: This sheet is a summary. It may not cover all possible information. If you have questions about this medicine, talk to your doctor, pharmacist, or health care provider.  2018 Elsevier/Gold Standard (2008-06-17 16:56:04)  Denosumab injection What is this medicine? DENOSUMAB (den oh sue mab) slows bone breakdown. Prolia is used to treat osteoporosis in women after menopause and in men. Delton See is used to treat a high calcium level due to cancer and to prevent bone fractures and other bone problems caused by multiple myeloma or cancer bone metastases. Delton See is also used to treat giant cell tumor of the bone. This medicine may be used for other purposes; ask your health care provider or pharmacist if you have questions. COMMON BRAND NAME(S): Prolia, XGEVA What should I tell my health care provider before I take this medicine? They need to know if you have any of these conditions: -dental disease -having surgery or tooth extraction -infection -kidney disease -low levels of  calcium or Vitamin D in the blood -malnutrition -on hemodialysis -skin conditions or sensitivity -thyroid or parathyroid disease -an unusual reaction to denosumab, other medicines, foods, dyes, or preservatives -pregnant or trying to get pregnant -breast-feeding How should I use this medicine? This medicine is for injection under the skin. It is given by a health care professional in a hospital or clinic setting. If you are getting Prolia, a special MedGuide will be given to you by the pharmacist with each prescription and refill. Be sure to read this information carefully each time. For Prolia, talk to your pediatrician regarding the use of this medicine in children. Special care may be needed. For Delton See, talk to your pediatrician regarding the use of this medicine in children. While this drug may be prescribed for children as young as 13 years for selected conditions, precautions do apply. Overdosage: If you think you have taken too much of this medicine contact a poison control center or emergency room at once. NOTE: This medicine is only for you. Do not share this medicine with others. What if I miss a dose? It is important not to miss your dose. Call your doctor or health care professional if you are unable to keep an appointment. What may interact with this medicine? Do not take this medicine with any of the following medications: -other medicines containing denosumab This medicine may also interact with the following medications: -medicines that lower your chance of fighting infection -steroid medicines like prednisone  or cortisone This list may not describe all possible interactions. Give your health care provider a list of all the medicines, herbs, non-prescription drugs, or dietary supplements you use. Also tell them if you smoke, drink alcohol, or use illegal drugs. Some items may interact with your medicine. What should I watch for while using this medicine? Visit your doctor or  health care professional for regular checks on your progress. Your doctor or health care professional may order blood tests and other tests to see how you are doing. Call your doctor or health care professional for advice if you get a fever, chills or sore throat, or other symptoms of a cold or flu. Do not treat yourself. This drug may decrease your body's ability to fight infection. Try to avoid being around people who are sick. You should make sure you get enough calcium and vitamin D while you are taking this medicine, unless your doctor tells you not to. Discuss the foods you eat and the vitamins you take with your health care professional. See your dentist regularly. Brush and floss your teeth as directed. Before you have any dental work done, tell your dentist you are receiving this medicine. Do not become pregnant while taking this medicine or for 5 months after stopping it. Talk with your doctor or health care professional about your birth control options while taking this medicine. Women should inform their doctor if they wish to become pregnant or think they might be pregnant. There is a potential for serious side effects to an unborn child. Talk to your health care professional or pharmacist for more information. What side effects may I notice from receiving this medicine? Side effects that you should report to your doctor or health care professional as soon as possible: -allergic reactions like skin rash, itching or hives, swelling of the face, lips, or tongue -bone pain -breathing problems -dizziness -jaw pain, especially after dental work -redness, blistering, peeling of the skin -signs and symptoms of infection like fever or chills; cough; sore throat; pain or trouble passing urine -signs of low calcium like fast heartbeat, muscle cramps or muscle pain; pain, tingling, numbness in the hands or feet; seizures -unusual bleeding or bruising -unusually weak or tired Side effects that  usually do not require medical attention (report to your doctor or health care professional if they continue or are bothersome): -constipation -diarrhea -headache -joint pain -loss of appetite -muscle pain -runny nose -tiredness -upset stomach This list may not describe all possible side effects. Call your doctor for medical advice about side effects. You may report side effects to FDA at 1-800-FDA-1088. Where should I keep my medicine? This medicine is only given in a clinic, doctor's office, or other health care setting and will not be stored at home. NOTE: This sheet is a summary. It may not cover all possible information. If you have questions about this medicine, talk to your doctor, pharmacist, or health care provider.  2018 Elsevier/Gold Standard (2016-12-13 19:17:21)

## 2017-04-27 NOTE — Patient Instructions (Signed)

## 2017-04-27 NOTE — Telephone Encounter (Signed)
Xgeva and Sandostatin injections scheduled every 4 weeks, per 04/27/17 los. Follow up appointment scheduled for 4 weeks, per 04/27/17 los. Patient was given a copy of the AVS report and appointment schedule per 04/27/17 los.

## 2017-04-28 ENCOUNTER — Other Ambulatory Visit: Payer: Medicare Other

## 2017-04-28 ENCOUNTER — Ambulatory Visit: Payer: Medicare Other

## 2017-05-03 NOTE — Progress Notes (Signed)
Cindy Byrd    HEMATOLOGY ONCOLOGY PROGRESS NOTE  Date of service: .04/27/2017  Patient Care Team: Tamsen Roers, MD as PCP - General (Family Medicine) [name], MD as Consulting Physician (Hematology and Oncology)  CHIEF COMPLAINTS/PURPOSE OF CONSULTATION: Follow-up for metastatic lung cancer  DIAGNOSIS:   #1 Metastatic non-small cell lung cancer with bilateral lung nodules and large metastatic lesion in the left Ilium. #2 Barrett's esophagus with some evidence of intramucosal adenocarcinoma of the esophagus. 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 Also had c diff colitis - treated   Current Treatment  1) Active surveillance 2) Xgeva 128m [place] q4weeks for bone metastases.  Previous Treatment  1 Palliative radiation therapy to the large left ilium metastases 2. IV Nivolumab x 20ycles (discontinued due to likely immune colitis) 3. Xgeva 1252mSC q4weeks for bone metastases.   HISTORY OF PRESENTING ILLNESS: (plz see my previous consultation for details of initial presentation)  INTERVAL HISTORY  [name] here for her scheduled followup for metastatic lung cancer. She notes her diarrhea is much better controlled and that she only has 1-2 bowel movements a day. No Fevers or chills. Notes that her appetite had improved significantly. She is tapering off her steroids. No abdominal pain. No GI bleeding. She notes her breathing is stable. Notes minimal left chest wall discomfort. Notes that she physically feels quite well and is primarily being bothered due to the emotional stress of separating from her significant other. No other acute new symptoms. She is eating well and has gained 9 pounds since her last clinic visit.  MEDICAL HISTORY:  Past Medical History:  Diagnosis Date  . Barrett's esophagus   . Bone neoplasm 06/24/2015  [facility]   metastatic poorly differentiated  carcinoma. tumor left groin surgical removal with radiation tx.  . Cataract    BILATERAL  . Cigarette smoker two packs a day or less    Currently still smoking 2 PPD - Not interested in quitting at this time.  . Colitis 2017  . Colon polyps    hyperplastic, tubular adenomas, tubulovillous adenoma  . Cough, persistent    hx. lung cancer ? primary-being evaluated, unsure of primary site.  . Depression 06/24/2015  . Diverticulosis   . [place]  . Endometriosis    Hysterectomy with BSO at age [age]rs  [place]   intramucosal  . Gastritis   . GERD (gastroesophageal reflux disease)   . H/O: pneumonia   . Hiatal hernia   . Hyperlipidemia   . Hypertension 06/24/2015   likely improved incidental to 40 lbs weight loss from her neoplasm. No Longer taking med for this as of 08-06-15  . IBS (irritable bowel syndrome)   . Pain    left hip-persistent"tumor of bone"-radiation tx. 10.  . Vitamin D deficiency disease    SURGICAL HISTORY: Past Surgical History:  Procedure Laterality Date  . BARTHOLIN GLAND CYST EXCISION  5276o ago   Does not want if it was an infected cyst or tumor. Was soon as delivery  . COLONOSCOPY W/ POLYPECTOMY     multiple times - last done 09/2014 per patient.  . ESOPHAGOGASTRODUODENOSCOPY (EGD) WITH PROPOFOL N/A 08/11/2015   Procedure: ESOPHAGOGASTRODUODENOSCOPY (EGD) WITH PROPOFOL;  Surgeon: JaJerene BearsMD;  Location: [facility];  Service: Gastroenterology;  Laterality: N/A;  . GANGLION CYST EXCISION    . KNEE ARTHROSCOPY  age  about 54 yrs  . TONSILLECTOMY    . TOTAL ABDOMINAL HYSTERECTOMY W/ BILATERAL SALPINGOOPHORECTOMY  at age 41 yrs   For endometriosis    SOCIAL HISTORY: Social History   Social History  . Marital status: Widowed    Spouse name: N/A  . Number of children: 2  . Years of education: N/A   Occupational History  . Not on file.   Social History Main Topics  . Smoking status: Former Smoker     Packs/day: 1.00    Years: 60.00    Types: Cigarettes    Quit date: 12/06/2015  . Smokeless tobacco: Never Used  . Alcohol use No  . Drug use: No  . Sexual activity: No   Other Topics Concern  . Not on file   Social History Narrative  . No narrative on file    FAMILY HISTORY: Family History  Problem Relation Age of Onset  . Colon cancer Brother   . Colon cancer Brother   . Stroke Mother   . Colon cancer Father   . Breast cancer Daughter 43       ER/PR+ stage II    ALLERGIES:  is allergic to penicillins; remeron [mirtazapine]; and latex. patient wonders if she has a penicillin allergy but notes that she is uncertain about this.  MEDICATIONS:  Current Outpatient Prescriptions  Medication Sig Dispense Refill  . budesonide (ENTOCORT EC) 3 MG 24 hr capsule Take 2 caps (6m) daily for 2 weeks.  Then 1 cap (359m daily. 60 capsule 0  . calcium-vitamin D (OSCAL WITH D) 500-200 MG-UNIT tablet Take 2 tablets by mouth 3 (three) times daily.    . citalopram (CELEXA) 20 MG tablet Take 1 tablet (20 mg total) by mouth daily. 30 tablet 0  . diphenoxylate-atropine (LOMOTIL) 2.5-0.025 MG tablet Take 1-2 tablets by mouth 4 (four) times daily as needed for diarrhea or loose stools. 60 tablet 0  . enoxaparin (LOVENOX) 80 MG/0.8ML injection Inject 0.8 mLs (80 mg total) into the skin daily. 30 Syringe 1  . lidocaine-prilocaine (EMLA) cream Apply small amount over port 1-2 hours prior to treatment, cover with plastic wrap (DO NOT RUB IN). 30 g prn  . liver oil-zinc oxide (DESITIN) 40 % ointment Apply topically daily. 56.7 g 0  . LORazepam (ATIVAN) 1 MG tablet Take 1 tablet (1 mg total) by mouth every 8 (eight) hours as needed for anxiety (or nausea). 30 tablet 0  . magnesium oxide (MAG-OX) 400 MG tablet TAKE 1 TABLET (400 MG TOTAL) BY MOUTH 2 (TWO) TIMES DAILY. 60 tablet 0  . mesalamine (LIALDA) 1.2 g EC tablet TAKE 4 TABLETS (4.8 G TOTAL) BY MOUTH DAILY WITH BREAKFAST. 120 tablet 0  . omeprazole  (PRILOSEC) 40 MG capsule TAKE 1 CAPSULE BY MOUTH EVERY DAY 90 capsule 0  . ondansetron (ZOFRAN) 8 MG tablet Take 1 tablet (8 mg total) by mouth every 8 (eight) hours as needed for nausea or vomiting. 60 tablet 0  . oxyCODONE-acetaminophen (PERCOCET/ROXICET) 5-325 MG tablet Take 1 tablet by mouth every 4 (four) hours as needed for severe pain. 15 tablet 0  . potassium chloride SA (K-DUR,KLOR-CON) 20 MEQ tablet Take 1 tablet (20 mEq total) by mouth daily. 30 tablet 1  . PRESCRIPTION MEDICATION Antibody Plan [facility]    . VITAMIN D, ERGOCALCIFEROL, PO Give 0.41m28m4,000 units) by mouth once daily    . zolpidem (AMBIEN CR) 12.5 MG CR tablet Take 1 tablet (12.5 mg total) by mouth at bedtime as needed for  sleep. 30 tablet 0   No current facility-administered medications for this visit.     REVIEW OF SYSTEMS:    10 point review of systems done and was noted to be negative except as noted above.  PHYSICAL EXAMINATION: ECOG PERFORMANCE STATUS: 2 - Symptomatic, <50% confined to bed  Vitals:   04/27/17 1419  BP: (!) 185/85  Pulse: 67  Resp: 18  Temp: 97.7 F (36.5 C)   Filed Weights   04/27/17 1419  Weight: 122 lb (55.3 kg)  . Wt Readings from Last 3 Encounters:  04/27/17 122 lb (55.3 kg)  02/14/17 113 lb 14.4 oz (51.7 kg)  12/29/16 121 lb (54.9 kg)   GENERAL:alert, NAD, appears well. SKIN: skin color, texture, turgor are normal, no rashes or significant lesions EYES: normal, conjunctiva are pink and non-injected, sclera clear OROPHARYNX:no exudate, no erythema and lips, buccal mucosa, and tongue normal  NECK: supple, thyroid normal size, non-tender, without nodularity LYMPH:  no palpable lymphadenopathy in the cervical, axillary or inguinal LUNGS: clear to auscultation and bilateral reduced breath sounds with normal respiratory effort  HEART: regular rate & rhythm and no murmurs and resolved lower extremity edema ABDOMEN:abdomen soft, non-tender and normal bowel sounds Musculoskeletal:  No pedal edema. No calf pain or tenderness.  PSYCH: alert & oriented x 3 with fluent speech NEURO: no focal motor/sensory deficits.  LABORATORY DATA:  I have reviewed the data as listed . CBC Latest Ref Rng & Units 04/27/2017 03/31/2017 03/03/2017  WBC 3.9 - 10.3 10e3/uL 5.8 6.7 6.6  Hemoglobin 11.6 - 15.9 g/dL 11.0(L) 10.6(L) 9.9(L)  Hematocrit 34.8 - 46.6 % 34.9 34.0(L) 30.4(L)  Platelets 145 - 400 10e3/uL 182 224 213   . CMP Latest Ref Rng & Units 04/27/2017 03/31/2017 03/03/2017  Glucose 70 - 140 mg/dl 90 86 89  BUN 7.0 - 26.0 mg/dL 16.7 14.6 14.3  Creatinine 0.6 - 1.1 mg/dL 0.8 0.8 0.8  Sodium 136 - 145 mEq/L 138 139 140  Potassium 3.5 - 5.1 mEq/L 4.0 4.1 3.8  Chloride 96 - 112 mEq/L - - -  CO2 22 - 29 mEq/L 22 26 25   Calcium 8.4 - 10.4 mg/dL 8.9 8.9 8.2(L)  Total Protein 6.4 - 8.3 g/dL 7.5 7.5 6.8  Total Bilirubin 0.20 - 1.20 mg/dL 0.25 0.31 0.31  Alkaline Phos 40 - 150 U/L 119 131 104  AST 5 - 34 U/L 37(H) 55(H) 36(H)  ALT 0 - 55 U/L 48 54 34     RADIOGRAPHIC STUDIES: I have personally reviewed the radiological images as listed and agreed with the findings in the report. No results found.  ASSESSMENT & PLAN:   #1 Metastatic poorly differentiated carcinoma with likely lung primary [non-small cell lung cancer].  CT of the head with and without contrast showed no evidence of metastatic disease. Patient notes much improved pain control. EGFR blood test mutation analysis negative. Patient's pain is much better controlled after radiation for the painful ilium met. CT chest abdomen pelvis 04/19/2016 shows no evidence of disease progression. Patient tolerated Nivolumab very well until recently when she developed grade 2 Immune colitis as a result of which Nivolumab has been discontinued. CT chest abdomen pelvis on 06/24/2016 shows no evidence of new disease or progression of metastatic disease. CT chest abdomen pelvis 09/06/2016 shows 1. Mixed interval response to therapy. 2.  There is a new left ventral chest wall lesion deep to the pectoralis musculature worrisome for metastatic disease. 3. Posterior lower lobe nodular densities are identified which may  reflect areas of pulmonary metastasis. 4. Interval decrease in size of destructive lesion involving the left iliac bone.  CT chest abd pelvis 12/08/2016: Cystic mass involving the left ventral chest wall has resolved in the interval. Likely was a hematoma due to trauma. Interval increase in size of pleural base mass overlying the posterior and inferior left lower lobe. There is also a new left pleural effusion identified.  CT chest 02/01/2017: Residual irregular soft tissue thickening/volume loss and trace left pleural fluid at the base of the left hemithorax, overall improved in appearance from 12/08/2016. No measurable lesion.  PLAN -No symptoms suggestive of disease progression. -She is clinically doing better eating better and has gained about 9 pounds -We shall continue her Xgeva every 4 weeks  -We'll repeat CT chest abdomen pelvis in about a month to restage her metastatic disease to determine further treatment strategy .  #2 diarrhea-  now resolved was previously. grade 2 likely related to immune colitis from her Nivolumaband also had c diff colitis (s/p vancomycin) and possible underlying IBD She is currently on Lialda, budesonide,probiotics and lomotil (not using her lomotil much) Plan  -Diarrhea is much better controlled -Continue sandostatin q4weeks -reduce budesonide to 57m po daily (currently on 931mpo daily) for 2 weeks and then 50m90mo daily.  #3h/o hypokalemia K today wnl Plan -continue current potassium dose. Will discontinue on f/u if k still stable.  #4 DVT and PE  -will need to be on ongoing Lovenox therapy -Monitor for bleeding .  #5 Barrett's esophagus 4cms in the distal esophagus with low and high-grade dysplasia cannot rule out an early intra-mucosal esophageal  adenocarcinoma. Plan -Patient being monitored by Dr. PyrHilarie Fredricksonom GI. -patient [facility].  CT chest/abd/pelvis in 1 month Labs in 1 month -continue XgeSpainC with Dr KalIrene Limbo 4-5 week with labs and CT  All questions were answered. The patient knows to call the clinic with any problems, questions or concerns.  GauSullivan Lone MS Medinamatology/Oncology Physician [facility]): [phone] cell): [phone]): [phone]

## 2017-05-10 ENCOUNTER — Other Ambulatory Visit: Payer: Self-pay | Admitting: Internal Medicine

## 2017-05-10 DIAGNOSIS — K529 Noninfective gastroenteritis and colitis, unspecified: Secondary | ICD-10-CM

## 2017-05-16 ENCOUNTER — Other Ambulatory Visit: Payer: Self-pay | Admitting: Internal Medicine

## 2017-05-16 DIAGNOSIS — K529 Noninfective gastroenteritis and colitis, unspecified: Secondary | ICD-10-CM

## 2017-05-19 ENCOUNTER — Other Ambulatory Visit: Payer: Self-pay | Admitting: Internal Medicine

## 2017-05-19 DIAGNOSIS — K529 Noninfective gastroenteritis and colitis, unspecified: Secondary | ICD-10-CM

## 2017-05-25 ENCOUNTER — Telehealth: Payer: Self-pay | Admitting: Hematology

## 2017-05-25 ENCOUNTER — Other Ambulatory Visit (HOSPITAL_BASED_OUTPATIENT_CLINIC_OR_DEPARTMENT_OTHER): Payer: Medicare Other

## 2017-05-25 ENCOUNTER — Encounter: Payer: Self-pay | Admitting: Hematology

## 2017-05-25 ENCOUNTER — Other Ambulatory Visit: Payer: Self-pay | Admitting: Internal Medicine

## 2017-05-25 ENCOUNTER — Ambulatory Visit (HOSPITAL_BASED_OUTPATIENT_CLINIC_OR_DEPARTMENT_OTHER): Payer: Medicare Other

## 2017-05-25 ENCOUNTER — Ambulatory Visit (HOSPITAL_BASED_OUTPATIENT_CLINIC_OR_DEPARTMENT_OTHER): Payer: Medicare Other | Admitting: Hematology

## 2017-05-25 VITALS — BP 163/70 | HR 52 | Temp 98.2°F | Resp 18 | Ht 67.0 in | Wt 126.2 lb

## 2017-05-25 DIAGNOSIS — C3491 Malignant neoplasm of unspecified part of right bronchus or lung: Secondary | ICD-10-CM

## 2017-05-25 DIAGNOSIS — C7951 Secondary malignant neoplasm of bone: Secondary | ICD-10-CM | POA: Diagnosis not present

## 2017-05-25 DIAGNOSIS — Z95828 Presence of other vascular implants and grafts: Secondary | ICD-10-CM

## 2017-05-25 DIAGNOSIS — C3490 Malignant neoplasm of unspecified part of unspecified bronchus or lung: Secondary | ICD-10-CM

## 2017-05-25 DIAGNOSIS — R197 Diarrhea, unspecified: Secondary | ICD-10-CM | POA: Diagnosis not present

## 2017-05-25 DIAGNOSIS — K529 Noninfective gastroenteritis and colitis, unspecified: Secondary | ICD-10-CM

## 2017-05-25 LAB — CBC & DIFF AND RETIC
BASO%: 0.4 % (ref 0.0–2.0)
Basophils Absolute: 0 10*3/uL (ref 0.0–0.1)
EOS ABS: 0.1 10*3/uL (ref 0.0–0.5)
EOS%: 1.2 % (ref 0.0–7.0)
HCT: 34.5 % — ABNORMAL LOW (ref 34.8–46.6)
HGB: 10.9 g/dL — ABNORMAL LOW (ref 11.6–15.9)
Immature Retic Fract: 8.4 % (ref 1.60–10.00)
LYMPH%: 24.5 % (ref 14.0–49.7)
MCH: 27.9 pg (ref 25.1–34.0)
MCHC: 31.6 g/dL (ref 31.5–36.0)
MCV: 88.5 fL (ref 79.5–101.0)
MONO#: 0.4 10*3/uL (ref 0.1–0.9)
MONO%: 5.9 % (ref 0.0–14.0)
NEUT%: 68 % (ref 38.4–76.8)
NEUTROS ABS: 4.6 10*3/uL (ref 1.5–6.5)
Platelets: 188 10*3/uL (ref 145–400)
RBC: 3.9 10*6/uL (ref 3.70–5.45)
RDW: 15.8 % — ABNORMAL HIGH (ref 11.2–14.5)
Retic %: 1.63 % (ref 0.70–2.10)
Retic Ct Abs: 63.57 10*3/uL (ref 33.70–90.70)
WBC: 6.8 10*3/uL (ref 3.9–10.3)
lymph#: 1.7 10*3/uL (ref 0.9–3.3)

## 2017-05-25 LAB — COMPREHENSIVE METABOLIC PANEL
ALBUMIN: 3.1 g/dL — AB (ref 3.5–5.0)
ALK PHOS: 86 U/L (ref 40–150)
ALT: 35 U/L (ref 0–55)
AST: 35 U/L — ABNORMAL HIGH (ref 5–34)
Anion Gap: 10 mEq/L (ref 3–11)
BUN: 14.3 mg/dL (ref 7.0–26.0)
CALCIUM: 9 mg/dL (ref 8.4–10.4)
CO2: 26 mEq/L (ref 22–29)
Chloride: 106 mEq/L (ref 98–109)
Creatinine: 0.8 mg/dL (ref 0.6–1.1)
EGFR: 71 mL/min/{1.73_m2} — AB (ref >90)
Glucose: 75 mg/dl (ref 70–140)
POTASSIUM: 4 meq/L (ref 3.5–5.1)
Sodium: 142 mEq/L (ref 136–145)
Total Bilirubin: 0.25 mg/dL (ref 0.20–1.20)
Total Protein: 7.3 g/dL (ref 6.4–8.3)

## 2017-05-25 MED ORDER — HEPARIN SOD (PORK) LOCK FLUSH 100 UNIT/ML IV SOLN
500.0000 [IU] | Freq: Once | INTRAVENOUS | Status: AC
  Administered 2017-05-25: 500 [IU] via INTRAVENOUS
  Filled 2017-05-25: qty 5

## 2017-05-25 MED ORDER — SODIUM CHLORIDE 0.9% FLUSH
10.0000 mL | INTRAVENOUS | Status: DC | PRN
  Administered 2017-05-25: 10 mL via INTRAVENOUS
  Filled 2017-05-25: qty 10

## 2017-05-25 MED ORDER — LORAZEPAM 1 MG PO TABS: 1.0000 mg | ORAL_TABLET | Freq: Three times a day (TID) | ORAL | 0 refills | Status: DC | PRN

## 2017-05-25 MED ORDER — OCTREOTIDE ACETATE 30 MG IM KIT
30.0000 mg | PACK | Freq: Once | INTRAMUSCULAR | Status: AC
  Administered 2017-05-25: 30 mg via INTRAMUSCULAR
  Filled 2017-05-25: qty 1

## 2017-05-25 MED ORDER — BUDESONIDE 3 MG PO CPEP: ORAL_CAPSULE | ORAL | 0 refills | Status: DC

## 2017-05-25 MED ORDER — OXYCODONE-ACETAMINOPHEN 5-325 MG PO TABS: 1.0000 | ORAL_TABLET | ORAL | 0 refills | Status: DC | PRN

## 2017-05-25 MED ORDER — DENOSUMAB 120 MG/1.7ML ~~LOC~~ SOLN
120.0000 mg | Freq: Once | SUBCUTANEOUS | Status: AC
  Administered 2017-05-25: 120 mg via SUBCUTANEOUS
  Filled 2017-05-25: qty 1.7

## 2017-05-25 NOTE — Telephone Encounter (Signed)
Scheduled appt per 6/21 los - Gave patient AVS and calender per los.

## 2017-05-25 NOTE — Patient Instructions (Signed)
*   Take budesonide 3mg  daily every OTHER day for 2 weeks, then STOP.     Thank you for choosing [facility] to provide your oncology and hematology care.  To afford each patient quality time with our providers, please arrive 30 minutes before your scheduled appointment time.  If you arrive late for your appointment, you may be asked to reschedule.  We strive to give you quality time with our providers, and arriving late affects you and other patients whose appointments are after yours.   If you are a no show for multiple scheduled visits, you may be dismissed from the clinic at the providers discretion.    Again, thank you for choosing [facility], our hope is that these requests will decrease the amount of time that you wait before being seen by our physicians.  ______________________________________________________________________  Should you have questions after your visit to the [facility], please contact our office at [phone] between the hours of 8:30 and 4:30 p.m.    Voicemails left after 4:30p.m will not be returned until the following business day.    For prescription refill requests, please have your pharmacy contact us directly.  Please also try to allow 48 hours for prescription requests.    Please contact the scheduling department for questions regarding scheduling.  For scheduling of procedures such as PET scans, CT scans, MRI, Ultrasound, etc please contact central scheduling at [phone].    Resources For Cancer Patients and Caregivers:   On[employer]ink.org:  A wonderful resource for patients and healthcare providers for information regarding your disease, ways to tract your treatment, what to expect, etc.     [place]:  (639)563-2327  Can help patients locate various types of support and financial assistance  Cancer Care: 1-800-813-HOPE 4037669802) Provides financial assistance, online support groups, medication/co-pay  assistance.    [place]:  ([phone] 826-2272 Where to apply for food stamps, Medicaid, and utility assistance  Medicare Rights [facility]: 587-545-5000 Helps people with Medicare understand their rights and benefits, navigate the Medicare system, and secure the quality healthcare they deserve  [employer]: [place] [employer]'s shared-ride transportation service for eligible riders who have a disability that prevents them from riding the fixed route bus.    For additional information on assistance programs please contact our social worker:   [place]:  [phone]

## 2017-05-25 NOTE — Patient Instructions (Signed)

## 2017-05-29 ENCOUNTER — Ambulatory Visit (HOSPITAL_COMMUNITY)
Admission: RE | Admit: 2017-05-29 | Discharge: 2017-05-29 | Disposition: A | Discharge status: Home / Self Care | Payer: Medicare Other | Source: Ambulatory Visit | Attending: Hematology | Admitting: Hematology

## 2017-05-29 ENCOUNTER — Encounter (HOSPITAL_COMMUNITY): Payer: Self-pay

## 2017-05-29 ENCOUNTER — Telehealth: Payer: Self-pay

## 2017-05-29 DIAGNOSIS — R933 Abnormal findings on diagnostic imaging of other parts of digestive tract: Secondary | ICD-10-CM | POA: Diagnosis not present

## 2017-05-29 DIAGNOSIS — C3491 Malignant neoplasm of unspecified part of right bronchus or lung: Secondary | ICD-10-CM | POA: Diagnosis not present

## 2017-05-29 DIAGNOSIS — K7689 Other specified diseases of liver: Secondary | ICD-10-CM | POA: Insufficient documentation

## 2017-05-29 DIAGNOSIS — I700 Atherosclerosis of aorta: Secondary | ICD-10-CM | POA: Diagnosis not present

## 2017-05-29 MED ORDER — IOPAMIDOL (ISOVUE-300) INJECTION 61%
INTRAVENOUS | Status: AC
  Filled 2017-05-29: qty 100

## 2017-05-29 MED ORDER — IOPAMIDOL (ISOVUE-300) INJECTION 61%
100.0000 mL | Freq: Once | INTRAVENOUS | Status: AC | PRN
  Administered 2017-05-29: 100 mL via INTRAVENOUS

## 2017-05-29 NOTE — Telephone Encounter (Signed)
Dr. Irene Limbo interested to know if pt has been experiencing abdominal pain. Pt verbalized that she has been. Will f/u with MD and pt tomorrow to determine what planned treatment will be. Pt anxious for CT scan results. Unable to share results with pt at this time without MD explanation. Will communicate with Dr. Irene Limbo about this tomorrow as well.

## 2017-05-30 ENCOUNTER — Other Ambulatory Visit: Payer: Self-pay

## 2017-05-30 ENCOUNTER — Telehealth: Payer: Self-pay

## 2017-05-30 MED ORDER — CIPROFLOXACIN HCL 500 MG PO TABS: 500.0000 mg | ORAL_TABLET | Freq: Two times a day (BID) | ORAL | 0 refills | Status: DC

## 2017-05-30 MED ORDER — METRONIDAZOLE 500 MG PO TABS: 500.0000 mg | ORAL_TABLET | Freq: Three times a day (TID) | ORAL | 0 refills | Status: DC

## 2017-05-30 NOTE — Telephone Encounter (Signed)
Passed basic information to pt about CT scan to pt per Dr. Irene Limbo. Plan to begin bowel rest via liquid diet for 3 days and progressing to soft diet for 3 days and progressing as able. Dr. Irene Limbo to order antibiotic for pt to take for the next 7-10 days. Pt verbalized understanding and in agreement with plan.

## 2017-05-30 NOTE — Telephone Encounter (Signed)
Dr. Irene Limbo ordered cipro and flagyl antibiotics for pt to take over the next week. Relayed this to pt. Should be available for pick-up tomorrow morning.

## 2017-06-01 ENCOUNTER — Other Ambulatory Visit: Payer: Self-pay | Admitting: Internal Medicine

## 2017-06-01 DIAGNOSIS — K529 Noninfective gastroenteritis and colitis, unspecified: Secondary | ICD-10-CM

## 2017-06-14 ENCOUNTER — Other Ambulatory Visit: Payer: Self-pay | Admitting: Nurse Practitioner

## 2017-06-18 ENCOUNTER — Other Ambulatory Visit: Payer: Self-pay | Admitting: Hematology

## 2017-06-20 ENCOUNTER — Other Ambulatory Visit: Payer: Self-pay

## 2017-06-20 MED ORDER — POTASSIUM CHLORIDE CRYS ER 20 MEQ PO TBCR: 20.0000 meq | EXTENDED_RELEASE_TABLET | Freq: Every day | ORAL | 0 refills | Status: DC

## 2017-06-22 ENCOUNTER — Emergency Department (HOSPITAL_COMMUNITY): Payer: Medicare Other

## 2017-06-22 ENCOUNTER — Other Ambulatory Visit: Payer: Medicare Other

## 2017-06-22 ENCOUNTER — Ambulatory Visit: Payer: Medicare Other

## 2017-06-22 ENCOUNTER — Observation Stay (HOSPITAL_COMMUNITY)
Admission: EM | Admit: 2017-06-22 | Discharge: 2017-06-24 | Disposition: A | Discharge status: Home / Self Care | Payer: Medicare Other | Attending: Internal Medicine | Admitting: Internal Medicine

## 2017-06-22 ENCOUNTER — Encounter (HOSPITAL_COMMUNITY): Payer: Self-pay | Admitting: Emergency Medicine

## 2017-06-22 DIAGNOSIS — I4891 Unspecified atrial fibrillation: Secondary | ICD-10-CM | POA: Diagnosis not present

## 2017-06-22 DIAGNOSIS — Z79899 Other long term (current) drug therapy: Secondary | ICD-10-CM | POA: Insufficient documentation

## 2017-06-22 DIAGNOSIS — I2699 Other pulmonary embolism without acute cor pulmonale: Secondary | ICD-10-CM | POA: Insufficient documentation

## 2017-06-22 DIAGNOSIS — E871 Hypo-osmolality and hyponatremia: Secondary | ICD-10-CM | POA: Insufficient documentation

## 2017-06-22 DIAGNOSIS — Z9221 Personal history of antineoplastic chemotherapy: Secondary | ICD-10-CM | POA: Diagnosis not present

## 2017-06-22 DIAGNOSIS — B370 Candidal stomatitis: Secondary | ICD-10-CM | POA: Insufficient documentation

## 2017-06-22 DIAGNOSIS — J90 Pleural effusion, not elsewhere classified: Secondary | ICD-10-CM | POA: Insufficient documentation

## 2017-06-22 DIAGNOSIS — C7951 Secondary malignant neoplasm of bone: Secondary | ICD-10-CM | POA: Diagnosis not present

## 2017-06-22 DIAGNOSIS — I10 Essential (primary) hypertension: Secondary | ICD-10-CM | POA: Diagnosis not present

## 2017-06-22 DIAGNOSIS — R1032 Left lower quadrant pain: Secondary | ICD-10-CM | POA: Insufficient documentation

## 2017-06-22 DIAGNOSIS — E876 Hypokalemia: Secondary | ICD-10-CM | POA: Insufficient documentation

## 2017-06-22 DIAGNOSIS — Z8619 Personal history of other infectious and parasitic diseases: Secondary | ICD-10-CM | POA: Insufficient documentation

## 2017-06-22 DIAGNOSIS — Z86711 Personal history of pulmonary embolism: Secondary | ICD-10-CM | POA: Diagnosis not present

## 2017-06-22 DIAGNOSIS — W19XXXA Unspecified fall, initial encounter: Secondary | ICD-10-CM | POA: Insufficient documentation

## 2017-06-22 DIAGNOSIS — K219 Gastro-esophageal reflux disease without esophagitis: Secondary | ICD-10-CM | POA: Insufficient documentation

## 2017-06-22 DIAGNOSIS — F329 Major depressive disorder, single episode, unspecified: Secondary | ICD-10-CM | POA: Diagnosis not present

## 2017-06-22 DIAGNOSIS — E785 Hyperlipidemia, unspecified: Secondary | ICD-10-CM | POA: Insufficient documentation

## 2017-06-22 DIAGNOSIS — Z800 Family history of malignant neoplasm of digestive organs: Secondary | ICD-10-CM | POA: Insufficient documentation

## 2017-06-22 DIAGNOSIS — S32302A Unspecified fracture of left ilium, initial encounter for closed fracture: Secondary | ICD-10-CM | POA: Diagnosis present

## 2017-06-22 DIAGNOSIS — E43 Unspecified severe protein-calorie malnutrition: Secondary | ICD-10-CM | POA: Insufficient documentation

## 2017-06-22 DIAGNOSIS — K2970 Gastritis, unspecified, without bleeding: Secondary | ICD-10-CM | POA: Insufficient documentation

## 2017-06-22 DIAGNOSIS — Z85118 Personal history of other malignant neoplasm of bronchus and lung: Secondary | ICD-10-CM | POA: Insufficient documentation

## 2017-06-22 DIAGNOSIS — B3781 Candidal esophagitis: Secondary | ICD-10-CM | POA: Diagnosis not present

## 2017-06-22 DIAGNOSIS — G893 Neoplasm related pain (acute) (chronic): Secondary | ICD-10-CM | POA: Insufficient documentation

## 2017-06-22 DIAGNOSIS — R296 Repeated falls: Secondary | ICD-10-CM | POA: Diagnosis not present

## 2017-06-22 DIAGNOSIS — Z803 Family history of malignant neoplasm of breast: Secondary | ICD-10-CM | POA: Insufficient documentation

## 2017-06-22 DIAGNOSIS — K529 Noninfective gastroenteritis and colitis, unspecified: Secondary | ICD-10-CM | POA: Diagnosis present

## 2017-06-22 DIAGNOSIS — Z880 Allergy status to penicillin: Secondary | ICD-10-CM | POA: Insufficient documentation

## 2017-06-22 DIAGNOSIS — K2270 Barrett's esophagus without dysplasia: Secondary | ICD-10-CM | POA: Insufficient documentation

## 2017-06-22 DIAGNOSIS — K5100 Ulcerative (chronic) pancolitis without complications: Secondary | ICD-10-CM | POA: Diagnosis not present

## 2017-06-22 DIAGNOSIS — Z91040 Latex allergy status: Secondary | ICD-10-CM | POA: Insufficient documentation

## 2017-06-22 DIAGNOSIS — Z6820 Body mass index (BMI) 20.0-20.9, adult: Secondary | ICD-10-CM | POA: Diagnosis not present

## 2017-06-22 DIAGNOSIS — K828 Other specified diseases of gallbladder: Secondary | ICD-10-CM | POA: Insufficient documentation

## 2017-06-22 DIAGNOSIS — K449 Diaphragmatic hernia without obstruction or gangrene: Secondary | ICD-10-CM | POA: Insufficient documentation

## 2017-06-22 DIAGNOSIS — J439 Emphysema, unspecified: Secondary | ICD-10-CM | POA: Diagnosis not present

## 2017-06-22 DIAGNOSIS — C3490 Malignant neoplasm of unspecified part of unspecified bronchus or lung: Secondary | ICD-10-CM

## 2017-06-22 DIAGNOSIS — Z823 Family history of stroke: Secondary | ICD-10-CM | POA: Insufficient documentation

## 2017-06-22 DIAGNOSIS — Z888 Allergy status to other drugs, medicaments and biological substances status: Secondary | ICD-10-CM | POA: Insufficient documentation

## 2017-06-22 DIAGNOSIS — Z87891 Personal history of nicotine dependence: Secondary | ICD-10-CM | POA: Diagnosis not present

## 2017-06-22 DIAGNOSIS — I700 Atherosclerosis of aorta: Secondary | ICD-10-CM | POA: Diagnosis not present

## 2017-06-22 DIAGNOSIS — Z923 Personal history of irradiation: Secondary | ICD-10-CM | POA: Insufficient documentation

## 2017-06-22 DIAGNOSIS — M84650S Pathological fracture in other disease, pelvis, sequela: Secondary | ICD-10-CM | POA: Insufficient documentation

## 2017-06-22 DIAGNOSIS — S2242XA Multiple fractures of ribs, left side, initial encounter for closed fracture: Secondary | ICD-10-CM | POA: Insufficient documentation

## 2017-06-22 DIAGNOSIS — Z7901 Long term (current) use of anticoagulants: Secondary | ICD-10-CM | POA: Insufficient documentation

## 2017-06-22 DIAGNOSIS — K7689 Other specified diseases of liver: Secondary | ICD-10-CM | POA: Insufficient documentation

## 2017-06-22 DIAGNOSIS — Z86010 Personal history of colonic polyps: Secondary | ICD-10-CM | POA: Insufficient documentation

## 2017-06-22 DIAGNOSIS — Z90710 Acquired absence of both cervix and uterus: Secondary | ICD-10-CM | POA: Insufficient documentation

## 2017-06-22 DIAGNOSIS — Z8501 Personal history of malignant neoplasm of esophagus: Secondary | ICD-10-CM | POA: Insufficient documentation

## 2017-06-22 LAB — COMPREHENSIVE METABOLIC PANEL
ALBUMIN: 3.3 g/dL — AB (ref 3.5–5.0)
ALK PHOS: 88 U/L (ref 38–126)
ALT: 21 U/L (ref 14–54)
ANION GAP: 10 (ref 5–15)
AST: 24 U/L (ref 15–41)
BILIRUBIN TOTAL: 0.8 mg/dL (ref 0.3–1.2)
BUN: 16 mg/dL (ref 6–20)
CALCIUM: 8.1 mg/dL — AB (ref 8.9–10.3)
CO2: 23 mmol/L (ref 22–32)
Chloride: 98 mmol/L — ABNORMAL LOW (ref 101–111)
Creatinine, Ser: 0.88 mg/dL (ref 0.44–1.00)
GFR calc Af Amer: >60 mL/min (ref >60)
GLUCOSE: 138 mg/dL — AB (ref 65–99)
POTASSIUM: 4.3 mmol/L (ref 3.5–5.1)
Sodium: 131 mmol/L — ABNORMAL LOW (ref 135–145)
TOTAL PROTEIN: 7.7 g/dL (ref 6.5–8.1)

## 2017-06-22 LAB — CBC
HCT: 35.6 % — ABNORMAL LOW (ref 36.0–46.0)
HEMOGLOBIN: 11.7 g/dL — AB (ref 12.0–15.0)
MCH: 28.3 pg (ref 26.0–34.0)
MCHC: 32.9 g/dL (ref 30.0–36.0)
MCV: 86.2 fL (ref 78.0–100.0)
Platelets: 225 10*3/uL (ref 150–400)
RBC: 4.13 MIL/uL (ref 3.87–5.11)
RDW: 14.9 % (ref 11.5–15.5)
WBC: 11.7 10*3/uL — AB (ref 4.0–10.5)

## 2017-06-22 LAB — LIPASE, BLOOD: Lipase: 17 U/L (ref 11–51)

## 2017-06-22 MED ORDER — FENTANYL CITRATE (PF) 100 MCG/2ML IJ SOLN
50.0000 ug | Freq: Once | INTRAMUSCULAR | Status: AC
  Administered 2017-06-23: 50 ug via INTRAVENOUS
  Filled 2017-06-22: qty 2

## 2017-06-22 NOTE — ED Notes (Signed)
Bed: WA10 Expected date:  Expected time:  Means of arrival:  Comments: 

## 2017-06-22 NOTE — ED Notes (Signed)
Patient transported to CT 

## 2017-06-22 NOTE — ED Notes (Signed)
Pt from home with complaints of fatigue and multiple falls at home. Pt has hx of cancer, but states she only gets a monthly shot (no longer gets chemo or radiation). Pt states she has sharp lower left abdominal pain that began 1 week ago when she she began feeling lethargic. Pt has 2 skin tears on right arm from fall today. Pt takes daily shot of Lovenox. Pt states she hit her head but denies LOC or head pain.

## 2017-06-22 NOTE — ED Provider Notes (Signed)
[facility] DEPT Provider Note   CSN: [id number] Arrival date & time: 06/22/17  2035  By signing my name below, I, Ny'Kea Lewis, attest that this documentation has been prepared under the direction and in the presence of Ripley Fraise, MD. Electronically Signed: Lise Auer, ED Scribe. 06/23/17. 12:43 AM.  History   Chief Complaint Chief Complaint  Patient presents with  . Fall  . Abdominal Pain  . Fatigue   The history is provided by the patient. No language interpreter was used.  Fall  This is a new problem. The current episode started less than 1 hour ago. The problem occurs rarely. Associated symptoms include abdominal pain and headaches. Pertinent negatives include no chest pain. Nothing aggravates the symptoms. The symptoms are relieved by lying down. She has tried nothing for the symptoms.  Abdominal Pain   This is a new problem. The current episode started 2 days ago. The problem has been gradually worsening. Associated symptoms include headaches. Pertinent negatives include nausea, vomiting and dysuria. Past workup includes CT scan.    HPI HPI Comments: Cindy Byrd is a 76 y.o. female with a history diverticulosis, GERD, depression, emphysema, HLD, HTN, and IBS, of who presents to the Emergency Department  For evaluation s/p mechanical fall that occurred PTA. Pt notes associated fatigue, dizziness, lightheadedness, acute on chronic left lower quadrant abdominal pain, difficulty ambulating, cough, and headache. Pt states she became weak and while walking she had a mechanical fall. Denies loss of consciousness. She does report hitting her head. She had a similar episode previously, that she reports was diagnosed with dehydration. She is currently on an anticoagulant. Denies recent change in medication. Pt notes she had a CT scan over two weeks ago and she was diagnosed with diverticulitis and she was prescribed two antibiotics, but reports she has not improved since. Denies  dizziness or lightheadedness at this time. Denies chest pain, dizziness, lightheadedness, visual disturbance, vomiting, nausea, dysuria, or bowel incontinence.   Past Medical History:  Diagnosis Date  . Barrett's esophagus   . Bone neoplasm 06/24/2015  . Cancer [facility])    metastatic poorly differentiated carcinoma. tumor left groin surgical removal with radiation tx.  . Cataract    BILATERAL  . Cigarette smoker two packs a day or less    Currently still smoking 2 PPD - Not interested in quitting at this time.  . Colitis 2017  . Colon polyps    hyperplastic, tubular adenomas, tubulovillous adenoma  . Cough, persistent    hx. lung cancer ? primary-being evaluated, unsure of primary site.  . Depression 06/24/2015  . Diverticulosis   . Emphysema of lung ([place])   . Endometriosis    Hysterectomy with BSO at age 31 yrs  . Esophageal adenocarcinoma ([place]) 08/11/15   intramucosal  . Gastritis   . GERD (gastroesophageal reflux disease)   . H/O: pneumonia   . Hiatal hernia   . Hyperlipidemia   . Hypertension 06/24/2015   likely improved incidental to 40 lbs weight loss from her neoplasm. No Longer taking med for this as of 08-06-15  . IBS (irritable bowel syndrome)   . Pain    left hip-persistent"tumor of bone"-radiation tx. 10.  . Vitamin D deficiency disease    Patient Active Problem List   Diagnosis Date Noted  . Metastatic lung cancer (metastasis from lung to other site), unspecified laterality ([place])   . Hypocalcemia 09/08/2016  . Vitamin D deficiency 09/08/2016  . New onset a-fib ([place]) 09/08/2016  . Chest pain   .  Thrush of mouth and esophagus ([place])   . Protein-calorie malnutrition, severe ([place])   . Colitis determined by colorectal biopsy   . Bilateral pulmonary embolism ([place]) 09/07/2016  . DVT of lower extremity, bilateral ([place]) 09/07/2016  . Palliative care by specialist   . Goals of care, counseling/discussion   . Advance care planning   . Diarrhea   . Ulcerative pancolitis  without complication ([place])   . Cancer ([place])   . Pressure injury of skin 09/02/2016  . HCAP (healthcare-associated pneumonia) 09/01/2016  . ARF (acute renal failure) ([place]) 09/01/2016  . Nausea with vomiting 07/18/2016  . Dehydration 07/18/2016  . Hypokalemia 07/18/2016  . Hypoalbuminemia due to protein-calorie malnutrition ([place]) 07/18/2016  . Peripheral edema 07/18/2016  . Diarrhea due to drug 05/19/2016  . Port catheter in place 04/07/2016  . Nicotine addiction 01/19/2016  . Barrett's esophagus determined by biopsy 09/04/2015  . Gastritis   . Primary malignant neoplasm of lung metastatic to other site ([place]) 08/06/2015  . Esophageal reflux 08/04/2015  . Bone metastasis ([place]) 06/24/2015  . Neoplasm related pain 06/24/2015  . Protein calorie malnutrition ([place]) 06/24/2015  . Anorexia 06/24/2015  . Heavy smoker (more than 20 cigarettes per day) 06/24/2015  . Depression 06/24/2015  . HTN (hypertension) 06/24/2015   Past Surgical History:  Procedure Laterality Date  . BARTHOLIN GLAND CYST EXCISION  76 yo ago   Does not want if it was an infected cyst or tumor. Was soon as delivery  . COLONOSCOPY W/ POLYPECTOMY     multiple times - last done 09/2014 per patient.  . ESOPHAGOGASTRODUODENOSCOPY (EGD) WITH PROPOFOL N/A 08/11/2015   Procedure: ESOPHAGOGASTRODUODENOSCOPY (EGD) WITH PROPOFOL;  Surgeon: [name], MD;  Location: [facility] ENDOSCOPY;  Service: Gastroenterology;  Laterality: N/A;  . GANGLION CYST EXCISION    . KNEE ARTHROSCOPY  age about 22 yrs  . TONSILLECTOMY    . TOTAL ABDOMINAL HYSTERECTOMY W/ BILATERAL SALPINGOOPHORECTOMY  at age 38 yrs   For endometriosis   OB History    No data available     Home Medications    Prior to Admission medications   Medication Sig Start Date End Date Taking? Authorizing Provider  amitriptyline (ELAVIL) 25 MG tablet Take 25 mg by mouth at bedtime. 06/19/17  Yes [provider]  calcium-vitamin D (OSCAL WITH D) 500-200 MG-UNIT tablet  Take 2 tablets by mouth 3 (three) times daily. 09/13/16  Yes Eugenie Filler, MD  citalopram (CELEXA) 20 MG tablet Take 1 tablet (20 mg total) by mouth daily. 09/16/16  Yes Lauree Chandler, NP  diphenoxylate-atropine (LOMOTIL) 2.5-0.025 MG tablet Take 1-2 tablets by mouth 4 (four) times daily as needed for diarrhea or loose stools. 02/14/17  Yes [name], MD  enoxaparin (LOVENOX) 80 MG/0.8ML injection Inject 0.8 mLs (80 mg total) into the skin daily. 04/27/17  Yes [name], MD  ibuprofen (ADVIL,MOTRIN) 200 MG tablet Take 400 mg by mouth every 6 (six) hours as needed for mild pain.   Yes [provider]  lidocaine-prilocaine (EMLA) cream Apply small amount over port 1-2 hours prior to treatment, cover with plastic wrap (DO NOT RUB IN). 08/27/15  Yes [name], MD  LORazepam (ATIVAN) 1 MG tablet Take 1 tablet (1 mg total) by mouth every 8 (eight) hours as needed for anxiety (or nausea). 05/25/17  Yes [name], MD  mesalamine (LIALDA) 1.2 g EC tablet TAKE 4 TABLETS (4.8 G TOTAL) BY MOUTH DAILY WITH BREAKFAST. 04/10/17  Yes Pyrtle,  [name], MD  omeprazole (PRILOSEC) 40 MG capsule TAKE 1 CAPSULE BY MOUTH EVERY DAY 04/27/17  Yes [name], MD  ondansetron (ZOFRAN) 8 MG tablet Take 1 tablet (8 mg total) by mouth every 8 (eight) hours as needed for nausea or vomiting. 12/29/16  Yes Pyrtle, [name], MD  oxyCODONE-acetaminophen (PERCOCET/ROXICET) 5-325 MG tablet Take 1 tablet by mouth every 4 (four) hours as needed for severe pain. 05/25/17  Yes [name], MD  Vitamin D, Ergocalciferol, (DRISDOL) 50000 units CAPS capsule Take 50,000 Units by mouth every 7 (seven) days.   Yes [provider]  zolpidem (AMBIEN CR) 12.5 MG CR tablet Take 1 tablet (12.5 mg total) by mouth at bedtime as needed for sleep. 08/05/16  Yes [name], MD  budesonide (ENTOCORT EC) 3 MG 24 hr capsule 3mg  (1cap) every other day for 2 weeks and then  stop. Patient not taking: Reported on 06/22/2017 05/25/17   [name], MD  ciprofloxacin (CIPRO) 500 MG tablet Take 1 tablet (500 mg total) by mouth 2 (two) times daily. Patient not taking: Reported on 06/22/2017 05/30/17   [name], MD  liver oil-zinc oxide (DESITIN) 40 % ointment Apply topically daily. Patient not taking: Reported on 06/22/2017 09/14/16   Eugenie Filler, MD  magnesium oxide (MAG-OX) 400 MG tablet TAKE 1 TABLET (400 MG TOTAL) BY MOUTH 2 (TWO) TIMES DAILY. Patient not taking: Reported on 06/22/2017 04/26/17   [name], MD  metroNIDAZOLE (FLAGYL) 500 MG tablet Take 1 tablet (500 mg total) by mouth 3 (three) times daily. NO ALCOHOL WHILE TAKING FLAGYL Patient not taking: Reported on 06/22/2017 05/30/17   [name], MD  potassium chloride SA (K-DUR,KLOR-CON) 20 MEQ tablet Take 1 tablet (20 mEq total) by mouth daily. 06/20/17   [name], MD  PRESCRIPTION MEDICATION Antibody Plan [place]    [provider]   Family History Family History  Problem Relation Age of Onset  . Colon cancer Brother   . Colon cancer Brother   . Stroke Mother   . Colon cancer Father   . Breast cancer Daughter 39       ER/PR+ stage II   Social History Social History  Substance Use Topics  . Smoking status: Former Smoker    Packs/day: 1.00    Years: 60.00    Types: Cigarettes    Quit date: 12/06/2015  . Smokeless tobacco: Never Used  . Alcohol use No   Allergies   Penicillins; Remeron [mirtazapine]; and Latex  Review of Systems Review of Systems  Constitutional: Positive for fatigue.  Eyes: Negative for visual disturbance.  Respiratory: Positive for cough.   Cardiovascular: Negative for chest pain.  Gastrointestinal: Positive for abdominal pain. Negative for nausea and vomiting.  Genitourinary: Negative for dysuria.  Neurological: Positive for dizziness, light-headedness and headaches.  All other systems reviewed and are  negative.  Physical Exam Updated Vital Signs BP 137/68 (BP Location: Left Arm)   Pulse 81   Temp 99.3 F (37.4 C) (Oral)   Resp 18   Ht 1.727 m (5\' 8" )   Wt 57.2 kg (126 lb)   SpO2 95%   BMI 19.16 kg/m   Physical Exam CONSTITUTIONAL: Elderly and frail  HEAD: Normocephalic/atraumatic EYES: EOMI/PERRL ENMT: Mucous membranes moist NECK: supple no meningeal signs SPINE/BACK:entire spine nontender CV: S1/S2 noted, no murmurs/rubs/gallops noted LUNGS:crackles bilaterally, no distress noted ABDOMEN: soft, moderate LLQ tenderness, no rebound or guarding, bowel sounds noted throughout abdomen GU:no  cva tenderness NEURO: Pt is awake/alert/appropriate, moves all extremitiesx4.  No facial droop. No arm or leg drift.   EXTREMITIES: pulses normal/equal, full ROM, scattered abrasions to right upper extremity, no bony tenderness or deformity  SKIN: warm, color normal PSYCH: no abnormalities of mood noted, alert and oriented to situation   ED Treatments / Results  DIAGNOSTIC STUDIES: Oxygen Saturation is 95% on RA, normal by my interpretation.   COORDINATION OF CARE: 11:41 PM-Discussed next steps with pt. Pt verbalized understanding and is agreeable with the plan.   Labs (all labs ordered are listed, but only abnormal results are displayed) Labs Reviewed  COMPREHENSIVE METABOLIC PANEL - Abnormal; Notable for the following:       Result Value   Sodium 131 (*)    Chloride 98 (*)    Glucose, Bld 138 (*)    Calcium 8.1 (*)    Albumin 3.3 (*)    All other components within normal limits  CBC - Abnormal; Notable for the following:    WBC 11.7 (*)    Hemoglobin 11.7 (*)    HCT 35.6 (*)    All other components within normal limits  URINALYSIS, ROUTINE W REFLEX MICROSCOPIC - Abnormal; Notable for the following:    Ketones, ur 5 (*)    All other components within normal limits  LIPASE, BLOOD    EKG  EKG Interpretation  Date/Time:  Friday June 23 2017 00:29:30 EDT Ventricular  Rate:  72 PR Interval:    QRS Duration: 83 QT Interval:  440 QTC Calculation: 482 R Axis:   61 Text Interpretation:  Sinus rhythm Nonspecific T abnormalities, lateral leads Confirmed by Ripley Fraise [phone]) on 06/23/2017 12:33:41 AM       Radiology Dg Chest 2 View  Result Date: 06/23/2017 CLINICAL DATA:  Fatigue and multiple falls at home. EXAM: CHEST  2 VIEW COMPARISON:  CT from 05/29/2017, CXR 11/03/2016 FINDINGS: The heart size and mediastinal contours are within normal limits. Aortic atherosclerosis at the arch without aneurysm is again seen. There is subsegmental atelectasis and/or scarring noted in in the left lower lobe and lingula. Mild hilar prominence on the right is believed to be a vascular summation based on recent CT. The visualized skeletal structures are unremarkable. Port catheter tip terminates in the distal SVC. IMPRESSION: No active cardiopulmonary disease. Lingular and left basilar atelectasis and/or scarring. Electronically Signed   By: Ashley Royalty M.D.   On: 06/23/2017 00:21   Ct Head Wo Contrast  Result Date: 06/23/2017 CLINICAL DATA:  76 year old female with weakness. EXAM: CT HEAD WITHOUT CONTRAST TECHNIQUE: Contiguous axial images were obtained from the base of the skull through the vertex without intravenous contrast. COMPARISON:  Head CT dated 09/07/2016 FINDINGS: Brain: There is stable cortical atrophy. Minimal periventricular and deep white matter chronic microvascular ischemic changes noted. There is no acute intracranial hemorrhage. No mass effect or midline shift. No extra-axial fluid collection. Vascular: No hyperdense vessel or unexpected calcification. Skull: Normal. Negative for fracture or focal lesion. Sinuses/Orbits: There is opacification of the majority of the left sphenoid sinus. The remainder of the visualized paranasal sinuses and mastoid air cells are clear. Other: None IMPRESSION: No acute intracranial pathology.  Stable cortical atrophy.  Electronically Signed   By: Anner Crete M.D.   On: 06/23/2017 01:36   Ct Abdomen Pelvis W Contrast  Result Date: 06/23/2017 CLINICAL DATA:  Left lower quadrant pain x2 days with leukocytosis. EXAM: CT ABDOMEN AND PELVIS WITH CONTRAST TECHNIQUE: Multidetector CT imaging of the  abdomen and pelvis was performed using the standard protocol following bolus administration of intravenous contrast. CONTRAST:  100 cc Isovue-300 COMPARISON:  05/29/2017 FINDINGS: Lower chest: The included heart is normal in size without pericardial effusion. There is bibasilar atelectasis and/or scarring. No effusion or pneumothorax. Small hiatal hernia. Hepatobiliary: Physiologic distention of the gallbladder without stones. Scattered stable water attenuating cysts and/or tiny hemangiomata of the liver, the largest are adjacent to the gallbladder fossa measuring approximately 10 and 11 mm diameter. Some are too small to further characterize however. There is no biliary dilatation. Pancreas: Atrophic pancreas without inflammation. Spleen: Normal Adrenals/Urinary Tract: Normal bilateral adrenal glands. No enhancing renal mass or obstructive uropathy. No nephrolithiasis. Stomach/Bowel: There is diffuse transmural thickening of the descending colon through rectosigmoid consistent with colitis. No bowel obstruction is seen. A moderate amount of fecal retention is seen within the ascending and transverse colon. There mild fluid-filled distention of small bowel is noted without obstruction. Stomach is contracted. The appendix is not visualized. No pericecal inflammation is noted however. Vascular/Lymphatic: Aortoiliac atherosclerosis. No pathologically enlarged lymph nodes by CT size criteria. Reproductive: Hysterectomy.  No adnexal mass. Other: No free air nor free fluid.  No hernia. Musculoskeletal: Ununited chronic pathologic fracture involving the left iliac bone with coarsening and thickening of the cortex, mixed lytic and sclerotic  appearance with diffuse bony expansion presumably representing postradiation change. Chronic posterior left lower rib fractures. IMPRESSION: 1. Colitis of the descending through rectosigmoid colon. 2. Mixed lytic and blastic lesion of the left iliac bone with pathologic fracture, bony expansion and thickening of the cortex is noted as before. Chronic left posterior rib fractures with healing. 3. Incidental findings of hepatic cysts and/or hemangiomata and aortoiliac atherosclerosis. Electronically Signed   By: Ashley Royalty M.D.   On: 06/23/2017 01:51    Procedures Procedures   Medications Ordered in ED Medications  cefTRIAXone (ROCEPHIN) 2 g in dextrose 5 % 50 mL IVPB (2 g Intravenous Transfusing/Transfer 06/23/17 0417)    And  metroNIDAZOLE (FLAGYL) IVPB 500 mg (500 mg Intravenous New Bag/Given 06/23/17 0419)  fentaNYL (SUBLIMAZE) injection 50 mcg (50 mcg Intravenous Given 06/23/17 0054)  iopamidol (ISOVUE-300) 61 % injection 100 mL (100 mLs Intravenous Contrast Given 06/23/17 0128)  fentaNYL (SUBLIMAZE) injection 50 mcg (50 mcg Intravenous Given 06/23/17 0352)     Initial Impression / Assessment and Plan / ED Course  I have reviewed the triage vital signs and the nursing notes.  Pertinent labs & imaging results that were available during my care of the patient were reviewed by me and considered in my medical decision making (see chart for details).     12:48 AM Pt with challenging history, reports recent falls and difficulty ambulating as well as cough, as well as LLQ pain despite recent antibiotics for diverticulitis Multiple imaging modalities ordered  tPA in stroke considered but not given due to: Onset over 3-4.5hours  4:21 AM Pt with persistent LLQ pain Colitis noted She has already failed outpatient therapy Will start rocephin/flagyl   As for dizziness, CT head negative (pt on anticoagulants with recent fall/head injury) No focal neuro deficits She will need to have this  further addressed in hospital  D/w dr Hal Hope for admission  Final Clinical Impressions(s) / ED Diagnoses   Final diagnoses:  Colitis    New Prescriptions New Prescriptions   No medications on file  I personally performed the services described in this documentation, which was scribed in my presence. The recorded information has been reviewed and  is accurate.        Ripley Fraise, MD 06/23/17 [phone]

## 2017-06-23 ENCOUNTER — Emergency Department (HOSPITAL_COMMUNITY): Payer: Medicare Other

## 2017-06-23 ENCOUNTER — Encounter (HOSPITAL_COMMUNITY): Payer: Self-pay

## 2017-06-23 ENCOUNTER — Telehealth: Payer: Self-pay | Admitting: *Deleted

## 2017-06-23 DIAGNOSIS — R1032 Left lower quadrant pain: Secondary | ICD-10-CM | POA: Diagnosis not present

## 2017-06-23 DIAGNOSIS — S32302A Unspecified fracture of left ilium, initial encounter for closed fracture: Secondary | ICD-10-CM | POA: Diagnosis present

## 2017-06-23 DIAGNOSIS — C3490 Malignant neoplasm of unspecified part of unspecified bronchus or lung: Secondary | ICD-10-CM

## 2017-06-23 DIAGNOSIS — Z7901 Long term (current) use of anticoagulants: Secondary | ICD-10-CM

## 2017-06-23 DIAGNOSIS — K529 Noninfective gastroenteritis and colitis, unspecified: Secondary | ICD-10-CM | POA: Diagnosis present

## 2017-06-23 DIAGNOSIS — I2699 Other pulmonary embolism without acute cor pulmonale: Secondary | ICD-10-CM | POA: Diagnosis not present

## 2017-06-23 DIAGNOSIS — K6389 Other specified diseases of intestine: Secondary | ICD-10-CM | POA: Diagnosis not present

## 2017-06-23 LAB — LACTIC ACID, PLASMA: Lactic Acid, Venous: 1 mmol/L (ref 0.5–1.9)

## 2017-06-23 LAB — URINALYSIS, ROUTINE W REFLEX MICROSCOPIC
BILIRUBIN URINE: NEGATIVE
Glucose, UA: NEGATIVE mg/dL
HGB URINE DIPSTICK: NEGATIVE
KETONES UR: 5 mg/dL — AB
Leukocytes, UA: NEGATIVE
NITRITE: NEGATIVE
PROTEIN: NEGATIVE mg/dL
Specific Gravity, Urine: 1.024 (ref 1.005–1.030)
pH: 5 (ref 5.0–8.0)

## 2017-06-23 LAB — CBC WITH DIFFERENTIAL/PLATELET
Basophils Absolute: 0 10*3/uL (ref 0.0–0.1)
Basophils Relative: 0 %
EOS ABS: 0.1 10*3/uL (ref 0.0–0.7)
Eosinophils Relative: 1 %
HEMATOCRIT: 36.5 % (ref 36.0–46.0)
HEMOGLOBIN: 12 g/dL (ref 12.0–15.0)
LYMPHS ABS: 2.1 10*3/uL (ref 0.7–4.0)
Lymphocytes Relative: 17 %
MCH: 28.2 pg (ref 26.0–34.0)
MCHC: 32.9 g/dL (ref 30.0–36.0)
MCV: 85.7 fL (ref 78.0–100.0)
MONOS PCT: 12 %
Monocytes Absolute: 1.4 10*3/uL — ABNORMAL HIGH (ref 0.1–1.0)
NEUTROS ABS: 8.2 10*3/uL — AB (ref 1.7–7.7)
NEUTROS PCT: 70 %
Platelets: 228 10*3/uL (ref 150–400)
RBC: 4.26 MIL/uL (ref 3.87–5.11)
RDW: 14.8 % (ref 11.5–15.5)
WBC: 11.8 10*3/uL — ABNORMAL HIGH (ref 4.0–10.5)

## 2017-06-23 MED ORDER — POTASSIUM CHLORIDE CRYS ER 20 MEQ PO TBCR
20.0000 meq | EXTENDED_RELEASE_TABLET | Freq: Every day | ORAL | Status: DC
  Administered 2017-06-23: 20 meq via ORAL
  Filled 2017-06-23: qty 1

## 2017-06-23 MED ORDER — FENTANYL CITRATE (PF) 100 MCG/2ML IJ SOLN
50.0000 ug | Freq: Once | INTRAMUSCULAR | Status: AC
  Administered 2017-06-23: 50 ug via INTRAVENOUS
  Filled 2017-06-23: qty 2

## 2017-06-23 MED ORDER — METRONIDAZOLE IN NACL 5-0.79 MG/ML-% IV SOLN
500.0000 mg | Freq: Three times a day (TID) | INTRAVENOUS | Status: DC
  Administered 2017-06-23 – 2017-06-24 (×4): 500 mg via INTRAVENOUS
  Filled 2017-06-23 (×5): qty 100

## 2017-06-23 MED ORDER — DEXTROSE 5 % IV SOLN
2.0000 g | Freq: Once | INTRAVENOUS | Status: AC
  Administered 2017-06-23: 2 g via INTRAVENOUS
  Filled 2017-06-23: qty 2

## 2017-06-23 MED ORDER — CALCIUM CARBONATE-VITAMIN D 500-200 MG-UNIT PO TABS
2.0000 | ORAL_TABLET | Freq: Three times a day (TID) | ORAL | Status: DC
  Administered 2017-06-23 – 2017-06-24 (×4): 2 via ORAL
  Filled 2017-06-23 (×4): qty 2

## 2017-06-23 MED ORDER — LORAZEPAM 1 MG PO TABS: 1.0000 mg | ORAL_TABLET | Freq: Three times a day (TID) | ORAL | Status: DC | PRN

## 2017-06-23 MED ORDER — IOPAMIDOL (ISOVUE-300) INJECTION 61%
100.0000 mL | Freq: Once | INTRAVENOUS | Status: AC | PRN
  Administered 2017-06-23: 100 mL via INTRAVENOUS

## 2017-06-23 MED ORDER — AMITRIPTYLINE HCL 25 MG PO TABS
25.0000 mg | ORAL_TABLET | Freq: Every day | ORAL | Status: DC
  Administered 2017-06-23: 25 mg via ORAL
  Filled 2017-06-23: qty 1

## 2017-06-23 MED ORDER — CITALOPRAM HYDROBROMIDE 20 MG PO TABS
20.0000 mg | ORAL_TABLET | Freq: Every day | ORAL | Status: DC
  Administered 2017-06-23 – 2017-06-24 (×2): 20 mg via ORAL
  Filled 2017-06-23 (×2): qty 1

## 2017-06-23 MED ORDER — METRONIDAZOLE IN NACL 5-0.79 MG/ML-% IV SOLN
500.0000 mg | Freq: Once | INTRAVENOUS | Status: AC
  Administered 2017-06-23: 500 mg via INTRAVENOUS
  Filled 2017-06-23: qty 100

## 2017-06-23 MED ORDER — SODIUM CHLORIDE 0.9 % IV SOLN
INTRAVENOUS | Status: DC
  Administered 2017-06-23 (×2): via INTRAVENOUS

## 2017-06-23 MED ORDER — ENOXAPARIN SODIUM 100 MG/ML ~~LOC~~ SOLN
1.5000 mg/kg | SUBCUTANEOUS | Status: DC
  Administered 2017-06-23: 85 mg via SUBCUTANEOUS
  Filled 2017-06-23: qty 1

## 2017-06-23 MED ORDER — DEXTROSE 5 % IV SOLN: 2.0000 g | INTRAVENOUS | Status: DC

## 2017-06-23 MED ORDER — ONDANSETRON HCL 4 MG PO TABS: 8.0000 mg | ORAL_TABLET | Freq: Three times a day (TID) | ORAL | Status: DC | PRN

## 2017-06-23 MED ORDER — IOPAMIDOL (ISOVUE-300) INJECTION 61%
INTRAVENOUS | Status: AC
  Administered 2017-06-23: 100 mL via INTRAVENOUS
  Filled 2017-06-23: qty 100

## 2017-06-23 MED ORDER — MESALAMINE 1.2 G PO TBEC
4.8000 g | DELAYED_RELEASE_TABLET | Freq: Every day | ORAL | Status: DC
  Administered 2017-06-23 – 2017-06-24 (×2): 4.8 g via ORAL
  Filled 2017-06-23 (×2): qty 4

## 2017-06-23 MED ORDER — ACETAMINOPHEN 325 MG PO TABS: 650.0000 mg | ORAL_TABLET | Freq: Four times a day (QID) | ORAL | Status: DC | PRN

## 2017-06-23 MED ORDER — ENSURE ENLIVE PO LIQD
237.0000 mL | Freq: Two times a day (BID) | ORAL | Status: DC
  Administered 2017-06-23 – 2017-06-24 (×3): 237 mL via ORAL

## 2017-06-23 MED ORDER — ACETAMINOPHEN 650 MG RE SUPP: 650.0000 mg | Freq: Four times a day (QID) | RECTAL | Status: DC | PRN

## 2017-06-23 MED ORDER — OXYCODONE-ACETAMINOPHEN 5-325 MG PO TABS
1.0000 | ORAL_TABLET | ORAL | Status: DC | PRN
  Administered 2017-06-23 – 2017-06-24 (×5): 1 via ORAL
  Filled 2017-06-23 (×5): qty 1

## 2017-06-23 MED ORDER — MORPHINE SULFATE (PF) 4 MG/ML IV SOLN: 1.0000 mg | INTRAVENOUS | Status: DC | PRN

## 2017-06-23 MED ORDER — ZOLPIDEM TARTRATE 5 MG PO TABS: 5.0000 mg | ORAL_TABLET | Freq: Every evening | ORAL | Status: DC | PRN

## 2017-06-23 MED ORDER — PANTOPRAZOLE SODIUM 40 MG PO TBEC
40.0000 mg | DELAYED_RELEASE_TABLET | Freq: Every day | ORAL | Status: DC
  Administered 2017-06-23 – 2017-06-24 (×2): 40 mg via ORAL
  Filled 2017-06-23 (×2): qty 1

## 2017-06-23 MED ORDER — ONDANSETRON HCL 4 MG/2ML IJ SOLN
4.0000 mg | Freq: Four times a day (QID) | INTRAMUSCULAR | Status: DC | PRN
  Administered 2017-06-23: 4 mg via INTRAVENOUS
  Filled 2017-06-23: qty 2

## 2017-06-23 MED ORDER — ONDANSETRON HCL 4 MG PO TABS: 4.0000 mg | ORAL_TABLET | Freq: Four times a day (QID) | ORAL | Status: DC | PRN

## 2017-06-23 NOTE — H&P (Signed)
History and Physical    Cindy Byrd:[id number] DOB: 11/28/1941 DOA: 06/22/2017  PCP: Tamsen Roers, MD  Patient coming from: Home.  Chief Complaint: Left lower quadrant pain.  HPI: Cindy Byrd is a 76 y.o. female with history of metastatic non-small cell lung cancer under observation, pulmonary embolism on Lovenox, Nivolumab induced colitis presents to the ER with complaints of left lower quadrant pain. Patient states patient has been having persistent left lower quadrant pain over the last 3 days. Denies any nausea vomiting or diarrhea. Denies any chest pain or shortness of breath. Patient did say that she had a fall yesterday where she did hit her head but did not lose consciousness. Patient states she has been having frequent falls.   ED Course: In the ER CT head was done which was negative. CT abdomen and pelvis shows left sided iliac fracture and colitis and rib fractures. Patient was started on antibiotics for colitis admitted for further observation. Patient on trying to ambulate his feeling weak and also has pain.  Review of Systems: As per HPI, rest all negative.   Past Medical History:  Diagnosis Date  . Barrett's esophagus   . Bone neoplasm 06/24/2015  . Cancer [facility])    metastatic poorly differentiated carcinoma. tumor left groin surgical removal with radiation tx.  . Cataract    BILATERAL  . Cigarette smoker two packs a day or less    Currently still smoking 2 PPD - Not interested in quitting at this time.  . Colitis 2017  . Colon polyps    hyperplastic, tubular adenomas, tubulovillous adenoma  . Cough, persistent    hx. lung cancer ? primary-being evaluated, unsure of primary site.  . Depression 06/24/2015  . Diverticulosis   . Emphysema of lung ([place])   . Endometriosis    Hysterectomy with BSO at age 75 yrs  . Esophageal adenocarcinoma ([place]) 08/11/15   intramucosal  . Gastritis   . GERD (gastroesophageal reflux disease)   . H/O: pneumonia   . Hiatal  hernia   . Hyperlipidemia   . Hypertension 06/24/2015   likely improved incidental to 40 lbs weight loss from her neoplasm. No Longer taking med for this as of 08-06-15  . IBS (irritable bowel syndrome)   . Pain    left hip-persistent"tumor of bone"-radiation tx. 10.  . Vitamin D deficiency disease     Past Surgical History:  Procedure Laterality Date  . BARTHOLIN GLAND CYST EXCISION  76 yo ago   Does not want if it was an infected cyst or tumor. Was soon as delivery  . COLONOSCOPY W/ POLYPECTOMY     multiple times - last done 09/2014 per patient.  . ESOPHAGOGASTRODUODENOSCOPY (EGD) WITH PROPOFOL N/A 08/11/2015   Procedure: ESOPHAGOGASTRODUODENOSCOPY (EGD) WITH PROPOFOL;  Surgeon: [name], MD;  Location: [facility] ENDOSCOPY;  Service: Gastroenterology;  Laterality: N/A;  . GANGLION CYST EXCISION    . KNEE ARTHROSCOPY  age about 59 yrs  . TONSILLECTOMY    . TOTAL ABDOMINAL HYSTERECTOMY W/ BILATERAL SALPINGOOPHORECTOMY  at age 54 yrs   For endometriosis     reports that she quit smoking about 18 months ago. Her smoking use included Cigarettes. She has a 60.00 pack-year smoking history. She has never used smokeless tobacco. She reports that she does not drink alcohol or use drugs.  Allergies  Allergen Reactions  . Penicillins Other (See Comments)    Unknown; childhood allergy  . Remeron [Mirtazapine] Other (See Comments)    nightmares  .  Latex Rash    Family History  Problem Relation Age of Onset  . Colon cancer Brother   . Colon cancer Brother   . Stroke Mother   . Colon cancer Father   . Breast cancer Daughter 31       ER/PR+ stage II    Prior to Admission medications   Medication Sig Start Date End Date Taking? Authorizing Provider  amitriptyline (ELAVIL) 25 MG tablet Take 25 mg by mouth at bedtime. 06/19/17  Yes [provider]  calcium-vitamin D (OSCAL WITH D) 500-200 MG-UNIT tablet Take 2 tablets by mouth 3 (three) times daily. 09/13/16  Yes Eugenie Filler, MD   citalopram (CELEXA) 20 MG tablet Take 1 tablet (20 mg total) by mouth daily. 09/16/16  Yes Lauree Chandler, NP  diphenoxylate-atropine (LOMOTIL) 2.5-0.025 MG tablet Take 1-2 tablets by mouth 4 (four) times daily as needed for diarrhea or loose stools. 02/14/17  Yes [name], MD  enoxaparin (LOVENOX) 80 MG/0.8ML injection Inject 0.8 mLs (80 mg total) into the skin daily. 04/27/17  Yes [name], MD  ibuprofen (ADVIL,MOTRIN) 200 MG tablet Take 400 mg by mouth every 6 (six) hours as needed for mild pain.   Yes [provider]  lidocaine-prilocaine (EMLA) cream Apply small amount over port 1-2 hours prior to treatment, cover with plastic wrap (DO NOT RUB IN). 08/27/15  Yes [name], MD  LORazepam (ATIVAN) 1 MG tablet Take 1 tablet (1 mg total) by mouth every 8 (eight) hours as needed for anxiety (or nausea). 05/25/17  Yes [name], MD  mesalamine (LIALDA) 1.2 g EC tablet TAKE 4 TABLETS (4.8 G TOTAL) BY MOUTH DAILY WITH BREAKFAST. 04/10/17  Yes Pyrtle, [name], MD  omeprazole (PRILOSEC) 40 MG capsule TAKE 1 CAPSULE BY MOUTH EVERY DAY 04/27/17  Yes [name], MD  ondansetron (ZOFRAN) 8 MG tablet Take 1 tablet (8 mg total) by mouth every 8 (eight) hours as needed for nausea or vomiting. 12/29/16  Yes Pyrtle, [name], MD  oxyCODONE-acetaminophen (PERCOCET/ROXICET) 5-325 MG tablet Take 1 tablet by mouth every 4 (four) hours as needed for severe pain. 05/25/17  Yes [name], MD  Vitamin D, Ergocalciferol, (DRISDOL) 50000 units CAPS capsule Take 50,000 Units by mouth every 7 (seven) days.   Yes [provider]  zolpidem (AMBIEN CR) 12.5 MG CR tablet Take 1 tablet (12.5 mg total) by mouth at bedtime as needed for sleep. 08/05/16  Yes [name], MD  budesonide (ENTOCORT EC) 3 MG 24 hr capsule 3mg  (1cap) every other day for 2 weeks and then stop. Patient not taking: Reported on 06/22/2017 05/25/17   [name], MD    ciprofloxacin (CIPRO) 500 MG tablet Take 1 tablet (500 mg total) by mouth 2 (two) times daily. Patient not taking: Reported on 06/22/2017 05/30/17   [name], MD  liver oil-zinc oxide (DESITIN) 40 % ointment Apply topically daily. Patient not taking: Reported on 06/22/2017 09/14/16   Eugenie Filler, MD  magnesium oxide (MAG-OX) 400 MG tablet TAKE 1 TABLET (400 MG TOTAL) BY MOUTH 2 (TWO) TIMES DAILY. Patient not taking: Reported on 06/22/2017 04/26/17   [name], MD  metroNIDAZOLE (FLAGYL) 500 MG tablet Take 1 tablet (500 mg total) by mouth 3 (three) times daily. NO ALCOHOL WHILE TAKING FLAGYL Patient not taking: Reported on 06/22/2017 05/30/17   [name], MD  potassium chloride SA (K-DUR,KLOR-CON) 20 MEQ tablet Take 1 tablet (20  mEq total) by mouth daily. 06/20/17   [name], MD  PRESCRIPTION MEDICATION Antibody Plan [place]    [provider]    Physical Exam: Vitals:   06/23/17 0315 06/23/17 0330 06/23/17 0345 06/23/17 0439  BP: (!) 145/74 (!) 143/69 134/75 (!) 154/84  Pulse:    75  Resp: (!) 22 (!) 30 (!) 28 (!) 26  Temp:    98.8 F (37.1 C)  TempSrc:    Oral  SpO2:    100%  Weight:      Height:          Constitutional: Moderately built poorly nourished. Vitals:   06/23/17 0315 06/23/17 0330 06/23/17 0345 06/23/17 0439  BP: (!) 145/74 (!) 143/69 134/75 (!) 154/84  Pulse:    75  Resp: (!) 22 (!) 30 (!) 28 (!) 26  Temp:    98.8 F (37.1 C)  TempSrc:    Oral  SpO2:    100%  Weight:      Height:       Eyes: Anicteric no pallor. ENMT: No discharge from the ears eyes nose and mouth. Neck: No mass felt. No JVD appreciated. Respiratory: No rhonchi or crepitations. Cardiovascular: S1-S2 heard no murmurs appreciated. Abdomen: Soft tenderness on the left lower quadrant. Musculoskeletal: No edema. No joint effusion. Patient undergoing left hip. Skin: Multiple skin bruises. Neurologic: Alert awake oriented to time place and  person. Moves all extremities. Psychiatric: Appears normal. Normal affect.   Labs on Admission: I have personally reviewed following labs and imaging studies  CBC:  Recent Labs Lab 06/22/17 2142  WBC 11.7*  HGB 11.7*  HCT 35.6*  MCV 86.2  PLT 128   Basic Metabolic Panel:  Recent Labs Lab 06/22/17 2142  NA 131*  K 4.3  CL 98*  CO2 23  GLUCOSE 138*  BUN 16  CREATININE 0.88  CALCIUM 8.1*   GFR: Estimated Creatinine Clearance: 48.3 mL/min (by C-G formula based on SCr of 0.88 mg/dL). Liver Function Tests:  Recent Labs Lab 06/22/17 2142  AST 24  ALT 21  ALKPHOS 88  BILITOT 0.8  PROT 7.7  ALBUMIN 3.3*    Recent Labs Lab 06/22/17 2142  LIPASE 17   No results for input(s): AMMONIA in the last 168 hours. Coagulation Profile: No results for input(s): INR, PROTIME in the last 168 hours. Cardiac Enzymes: No results for input(s): CKTOTAL, CKMB, CKMBINDEX, TROPONINI in the last 168 hours. BNP (last 3 results) No results for input(s): PROBNP in the last 8760 hours. HbA1C: No results for input(s): HGBA1C in the last 72 hours. CBG: No results for input(s): GLUCAP in the last 168 hours. Lipid Profile: No results for input(s): CHOL, HDL, LDLCALC, TRIG, CHOLHDL, LDLDIRECT in the last 72 hours. Thyroid Function Tests: No results for input(s): TSH, T4TOTAL, FREET4, T3FREE, THYROIDAB in the last 72 hours. Anemia Panel: No results for input(s): VITAMINB12, FOLATE, FERRITIN, TIBC, IRON, RETICCTPCT in the last 72 hours. Urine analysis:    Component Value Date/Time   COLORURINE YELLOW 06/23/2017 0154   APPEARANCEUR CLEAR 06/23/2017 0154   LABSPEC 1.024 06/23/2017 0154   PHURINE 5.0 06/23/2017 0154   GLUCOSEU NEGATIVE 06/23/2017 0154   HGBUR NEGATIVE 06/23/2017 0154   BILIRUBINUR NEGATIVE 06/23/2017 0154   KETONESUR 5 (A) 06/23/2017 0154   PROTEINUR NEGATIVE 06/23/2017 0154   NITRITE NEGATIVE 06/23/2017 0154   LEUKOCYTESUR NEGATIVE 06/23/2017 0154   Sepsis  Labs: @LABRCNTIP (procalcitonin:4,lacticidven:4) )No results found for this or any previous visit (from the past 240 hour(s)).  Radiological Exams on Admission: Dg Chest 2 View  Result Date: 06/23/2017 CLINICAL DATA:  Fatigue and multiple falls at home. EXAM: CHEST  2 VIEW COMPARISON:  CT from 05/29/2017, CXR 11/03/2016 FINDINGS: The heart size and mediastinal contours are within normal limits. Aortic atherosclerosis at the arch without aneurysm is again seen. There is subsegmental atelectasis and/or scarring noted in in the left lower lobe and lingula. Mild hilar prominence on the right is believed to be a vascular summation based on recent CT. The visualized skeletal structures are unremarkable. Port catheter tip terminates in the distal SVC. IMPRESSION: No active cardiopulmonary disease. Lingular and left basilar atelectasis and/or scarring. Electronically Signed   By: Ashley Royalty M.D.   On: 06/23/2017 00:21   Ct Head Wo Contrast  Result Date: 06/23/2017 CLINICAL DATA:  76 year old female with weakness. EXAM: CT HEAD WITHOUT CONTRAST TECHNIQUE: Contiguous axial images were obtained from the base of the skull through the vertex without intravenous contrast. COMPARISON:  Head CT dated 09/07/2016 FINDINGS: Brain: There is stable cortical atrophy. Minimal periventricular and deep white matter chronic microvascular ischemic changes noted. There is no acute intracranial hemorrhage. No mass effect or midline shift. No extra-axial fluid collection. Vascular: No hyperdense vessel or unexpected calcification. Skull: Normal. Negative for fracture or focal lesion. Sinuses/Orbits: There is opacification of the majority of the left sphenoid sinus. The remainder of the visualized paranasal sinuses and mastoid air cells are clear. Other: None IMPRESSION: No acute intracranial pathology.  Stable cortical atrophy. Electronically Signed   By: Anner Crete M.D.   On: 06/23/2017 01:36   Ct Abdomen Pelvis W  Contrast  Result Date: 06/23/2017 CLINICAL DATA:  Left lower quadrant pain x2 days with leukocytosis. EXAM: CT ABDOMEN AND PELVIS WITH CONTRAST TECHNIQUE: Multidetector CT imaging of the abdomen and pelvis was performed using the standard protocol following bolus administration of intravenous contrast. CONTRAST:  100 cc Isovue-300 COMPARISON:  05/29/2017 FINDINGS: Lower chest: The included heart is normal in size without pericardial effusion. There is bibasilar atelectasis and/or scarring. No effusion or pneumothorax. Small hiatal hernia. Hepatobiliary: Physiologic distention of the gallbladder without stones. Scattered stable water attenuating cysts and/or tiny hemangiomata of the liver, the largest are adjacent to the gallbladder fossa measuring approximately 10 and 11 mm diameter. Some are too small to further characterize however. There is no biliary dilatation. Pancreas: Atrophic pancreas without inflammation. Spleen: Normal Adrenals/Urinary Tract: Normal bilateral adrenal glands. No enhancing renal mass or obstructive uropathy. No nephrolithiasis. Stomach/Bowel: There is diffuse transmural thickening of the descending colon through rectosigmoid consistent with colitis. No bowel obstruction is seen. A moderate amount of fecal retention is seen within the ascending and transverse colon. There mild fluid-filled distention of small bowel is noted without obstruction. Stomach is contracted. The appendix is not visualized. No pericecal inflammation is noted however. Vascular/Lymphatic: Aortoiliac atherosclerosis. No pathologically enlarged lymph nodes by CT size criteria. Reproductive: Hysterectomy.  No adnexal mass. Other: No free air nor free fluid.  No hernia. 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 Chronic  posterior left lower rib fractures. IMPRESSION: 1. Colitis of the descending through rectosigmoid colon. 2. Mixed lytic and blastic lesion of the left iliac bone with pathologic fracture, bony expansion and thickening of the cortex is noted as before. Chronic left posterior rib fractures with healing. 3. Incidental findings of hepatic cysts and/or hemangiomata and aortoiliac  atherosclerosis. Electronically Signed   By: Ashley Royalty M.D.   On: 06/23/2017 01:51     Assessment/Plan Principal Problem:   Left lower quadrant pain Active Problems:   Bilateral pulmonary embolism (HCC)   Colitis   Fracture of left iliac crest (HCC)    1. Left lower quadrant pain probably from colitis and possibly from the pathological fracture of the left iliac bone - patient is on ceftriaxone and Flagyl. Check lactate will continue hydration. May discuss with patient's oncologist orthopedic surgeon about the pathological fracture of the iliac bone. Get physical therapy probably may need rehabilitation. Patient has history of colitis from Nivolumab. Patient is also on Lialda for the colitis. 2. History of pulmonary embolism - on Lovenox. 3. Anemia probably cancer related - follow CBC. 4. History of hypertension presently off medications - follow blood pressure trends.  I have reviewed patient's old chart and labs.   DVT prophylaxis: Lovenox. Code Status: DO NOT RESUSCITATE.  Family Communication: No family at the bedside.  Disposition Plan: To be determined.  Consults called: Physical therapy.  Admission status: Observation.    Rise Patience MD [facility] Pager [phone].  If 7PM-7AM, please contact night-coverage [email or web address] Password [facility]  06/23/2017, 5:58 AM

## 2017-06-23 NOTE — Consult Note (Signed)
Referring Provider: Dr. Tyrell Antonio Primary Care Physician:  Tamsen Roers, MD Primary Gastroenterologist:  Dr. [name]  Reason for Consultation:  Colitis  HPI: Cindy Byrd is a 76 y.o. female with history of metastatic non-small cell lung cancer currently under observation, pulmonary embolism on Lovenox, Nivolumab induced colitis when she was on this medication in 2017.  She presented to the ER with complaints of left lower quadrant pain. Patient states she has been having persistent left lower quadrant pain over the last couple of weeks.  Had a CT scan at the end of June that showed possible diverticulitis so she was treated with a course of cipro and flagyl.  She says that the pain got better but did not last long until it started back up again.  Then over the past few days she has just been feeling poorly with decreased appetite and increased pain.  Denies any nausea, vomiting, or diarrhea.  Says that she had 2-3 loose stools every day and it has been like for that quite some time.   ED Course: In the ER CT abdomen and pelvis shows left sided iliac fracture and colitis in the descending through rectosigmoid colon. Patient was started on antibiotics for colitis admitted for further observation.  GI was consulted.  Stool GI pathogen panel was ordered.  She continues on IV flagyl.  She says that she is actually feeling a little better already, but does still have pain.  In regards to her history of colitis, she was on Nivolumab last year and colonoscopy in 06/2016 revealed coloitis.  Biopsies revealed active colitis with focal erosion and no features c/w IBD, most likely drug induced reaction.  Was treated with Lialda 4.8 grams daily, which she is still taking and budesonide, which she has been off of for several months.  She was also diagnosed with Cdiff last year as well, which was successfully treated.  Had been doing well up until one month ago.  Says that she is already feeling slightly  better today.  Interested in going home as soon as possible.  Tolerating some full liquids.  Of note, she has Barrett's esophagus with at least high grade dysplasia.  Was going to be seen on [facility] for removal but she has chosen not to do so.   Past Medical History:  Diagnosis Date  . Barrett's esophagus   . Bone neoplasm 06/24/2015  . Cancer [facility])    metastatic poorly differentiated carcinoma. tumor left groin surgical removal with radiation tx.  . Cataract    BILATERAL  . Cigarette smoker two packs a day or less    Currently still smoking 2 PPD - Not interested in quitting at this time.  . Colitis 2017  . Colon polyps    hyperplastic, tubular adenomas, tubulovillous adenoma  . Cough, persistent    hx. lung cancer ? primary-being evaluated, unsure of primary site.  . Depression 06/24/2015  . Diverticulosis   . Emphysema of lung ([place])   . Endometriosis    Hysterectomy with BSO at age 8 yrs  . Esophageal adenocarcinoma ([place]) 08/11/15   intramucosal  . Gastritis   . GERD (gastroesophageal reflux disease)   . H/O: pneumonia   . Hiatal hernia   . Hyperlipidemia   . Hypertension 06/24/2015   likely improved incidental to 40 lbs weight loss from her neoplasm. No Longer taking med for this as of 08-06-15  . IBS (irritable bowel syndrome)   . Pain    left hip-persistent"tumor of bone"-radiation tx.  10.  . Vitamin D deficiency disease     Past Surgical History:  Procedure Laterality Date  . BARTHOLIN GLAND CYST EXCISION  76 yo ago   Does not want if it was an infected cyst or tumor. Was soon as delivery  . COLONOSCOPY W/ POLYPECTOMY     multiple times - last done 09/2014 per patient.  . ESOPHAGOGASTRODUODENOSCOPY (EGD) WITH PROPOFOL N/A 08/11/2015   Procedure: ESOPHAGOGASTRODUODENOSCOPY (EGD) WITH PROPOFOL;  Surgeon: [name], MD;  Location: [facility];  Service: Gastroenterology;  Laterality: N/A;  . GANGLION CYST EXCISION    . KNEE ARTHROSCOPY  age about 6 yrs  . TONSILLECTOMY     . TOTAL ABDOMINAL HYSTERECTOMY W/ BILATERAL SALPINGOOPHORECTOMY  at age 52 yrs   For endometriosis    Prior to Admission medications   Medication Sig Start Date End Date Taking? Authorizing Provider  amitriptyline (ELAVIL) 25 MG tablet Take 25 mg by mouth at bedtime. 06/19/17  Yes [provider]  calcium-vitamin D (OSCAL WITH D) 500-200 MG-UNIT tablet Take 2 tablets by mouth 3 (three) times daily. 09/13/16  Yes Eugenie Filler, MD  citalopram (CELEXA) 20 MG tablet Take 1 tablet (20 mg total) by mouth daily. 09/16/16  Yes Lauree Chandler, NP  diphenoxylate-atropine (LOMOTIL) 2.5-0.025 MG tablet Take 1-2 tablets by mouth 4 (four) times daily as needed for diarrhea or loose stools. 02/14/17  Yes [name], MD  enoxaparin (LOVENOX) 80 MG/0.8ML injection Inject 0.8 mLs (80 mg total) into the skin daily. 04/27/17  Yes [name], MD  ibuprofen (ADVIL,MOTRIN) 200 MG tablet Take 400 mg by mouth every 6 (six) hours as needed for mild pain.   Yes [provider]  lidocaine-prilocaine (EMLA) cream Apply small amount over port 1-2 hours prior to treatment, cover with plastic wrap (DO NOT RUB IN). 08/27/15  Yes [name], MD  LORazepam (ATIVAN) 1 MG tablet Take 1 tablet (1 mg total) by mouth every 8 (eight) hours as needed for anxiety (or nausea). 05/25/17  Yes [name], MD  mesalamine (LIALDA) 1.2 g EC tablet TAKE 4 TABLETS (4.8 G TOTAL) BY MOUTH DAILY WITH BREAKFAST. 04/10/17  Yes Pyrtle, [name], MD  omeprazole (PRILOSEC) 40 MG capsule TAKE 1 CAPSULE BY MOUTH EVERY DAY 04/27/17  Yes [name], MD  ondansetron (ZOFRAN) 8 MG tablet Take 1 tablet (8 mg total) by mouth every 8 (eight) hours as needed for nausea or vomiting. 12/29/16  Yes Pyrtle, [name], MD  oxyCODONE-acetaminophen (PERCOCET/ROXICET) 5-325 MG tablet Take 1 tablet by mouth every 4 (four) hours as needed for severe pain. 05/25/17  Yes [name], MD  Vitamin D,  Ergocalciferol, (DRISDOL) 50000 units CAPS capsule Take 50,000 Units by mouth every 7 (seven) days.   Yes [provider]  zolpidem (AMBIEN CR) 12.5 MG CR tablet Take 1 tablet (12.5 mg total) by mouth at bedtime as needed for sleep. 08/05/16  Yes [name], MD  budesonide (ENTOCORT EC) 3 MG 24 hr capsule 3mg  (1cap) every other day for 2 weeks and then stop. Patient not taking: Reported on 06/22/2017 05/25/17   [name], MD  ciprofloxacin (CIPRO) 500 MG tablet Take 1 tablet (500 mg total) by mouth 2 (two) times daily. Patient not taking: Reported on 06/22/2017 05/30/17   [name], MD  liver oil-zinc oxide (DESITIN) 40 % ointment Apply topically daily. Patient not taking: Reported on 06/22/2017 09/14/16   Eugenie Filler, MD  magnesium oxide (MAG-OX) 400 MG tablet TAKE 1 TABLET (400 MG TOTAL) BY MOUTH 2 (TWO) TIMES DAILY. Patient not taking: Reported on 06/22/2017 04/26/17   [name], MD  metroNIDAZOLE (FLAGYL) 500 MG tablet Take 1 tablet (500 mg total) by mouth 3 (three) times daily. NO ALCOHOL WHILE TAKING FLAGYL Patient not taking: Reported on 06/22/2017 05/30/17   [name], MD  potassium chloride SA (K-DUR,KLOR-CON) 20 MEQ tablet Take 1 tablet (20 mEq total) by mouth daily. 06/20/17   [name], MD  PRESCRIPTION MEDICATION Antibody Plan [place]    [provider]    Current Facility-Administered Medications  Medication Dose Route Frequency Provider Last Rate Last Dose  . 0.9 %  sodium chloride infusion   Intravenous Continuous Rise Patience, MD 75 mL/hr at 06/23/17 0600    . acetaminophen (TYLENOL) tablet 650 mg  650 mg Oral Q6H PRN Rise Patience, MD       Or  . acetaminophen (TYLENOL) suppository 650 mg  650 mg Rectal Q6H PRN Rise Patience, MD      . amitriptyline (ELAVIL) tablet 25 mg  25 mg Oral QHS Rise Patience, MD      . calcium-vitamin D (OSCAL WITH D) 500-200 MG-UNIT per tablet 2  tablet  2 tablet Oral TID Rise Patience, MD   2 tablet at 06/23/17 1115  . citalopram (CELEXA) tablet 20 mg  20 mg Oral Daily Rise Patience, MD   20 mg at 06/23/17 1115  . enoxaparin (LOVENOX) injection 85 mg  1.5 mg/kg Subcutaneous Q24H Dara Hoyer, RPH   85 mg at 06/23/17 0848  . feeding supplement (ENSURE ENLIVE) (ENSURE ENLIVE) liquid 237 mL  237 mL Oral BID BM Rise Patience, MD   237 mL at 06/23/17 1115  . LORazepam (ATIVAN) tablet 1 mg  1 mg Oral Q8H PRN Rise Patience, MD      . mesalamine (LIALDA) EC tablet 4.8 g  4.8 g Oral Q breakfast Rise Patience, MD   4.8 g at 06/23/17 0847  . metroNIDAZOLE (FLAGYL) IVPB 500 mg  500 mg Intravenous Q8H Rise Patience, MD   Stopped at 06/23/17 1215  . morphine 4 MG/ML injection 1 mg  1 mg Intravenous Q4H PRN Rise Patience, MD      . ondansetron [facility]) tablet 4 mg  4 mg Oral Q6H PRN Rise Patience, MD       Or  . ondansetron [facility]) injection 4 mg  4 mg Intravenous Q6H PRN Rise Patience, MD   4 mg at 06/23/17 0847  . oxyCODONE-acetaminophen (PERCOCET/ROXICET) 5-325 MG per tablet 1 tablet  1 tablet Oral Q4H PRN Rise Patience, MD   1 tablet at 06/23/17 0847  . pantoprazole (PROTONIX) EC tablet 40 mg  40 mg Oral Daily Rise Patience, MD   40 mg at 06/23/17 1115  . zolpidem (AMBIEN) tablet 5 mg  5 mg Oral QHS PRN Rise Patience, MD        Allergies as of 06/22/2017 - Review Complete 06/22/2017  Allergen Reaction Noted  . Penicillins Other (See Comments) 07/13/2015  . Remeron [mirtazapine] Other (See Comments) 12/21/2015  . Latex Rash 08/07/2015    Family History  Problem Relation Age of Onset  . Colon cancer Brother   . Colon cancer Brother   . Stroke Mother   . Colon cancer Father   . Breast cancer Daughter 58  ER/PR+ stage II    Social History   Social History  . Marital status: Widowed    Spouse name: N/A  . Number of children: 2  . Years of  education: N/A   Occupational History  . Not on file.   Social History Main Topics  . Smoking status: Former Smoker    Packs/day: 1.00    Years: 60.00    Types: Cigarettes    Quit date: 12/06/2015  . Smokeless tobacco: Never Used  . Alcohol use No  . Drug use: No  . Sexual activity: No   Other Topics Concern  . Not on file   Social History Narrative  . No narrative on file    Review of Systems: ROS is O/W negative.  Physical Exam: Vital signs in last 24 hours: Temp:  [98.8 F (37.1 C)-99.3 F (37.4 C)] 98.8 F (37.1 C) (07/20 0439) Pulse Rate:  [73-82] 75 (07/20 0439) Resp:  [17-30] 26 (07/20 0439) BP: (134-170)/(67-84) 154/84 (07/20 0439) SpO2:  [94 %-100 %] 100 % (07/20 0439) Weight:  [126 lb (57.2 kg)-132 lb 0.9 oz (59.9 kg)] 132 lb 0.9 oz (59.9 kg) (07/20 0902) Last BM Date: 06/23/17 General:  Alert, Well-developed, well-nourished, pleasant and cooperative in NAD Head:  Normocephalic and atraumatic. Eyes:  Sclera clear, no icterus.  Conjunctiva pink. Ears:  Normal auditory acuity. Mouth:  No deformity or lesions.   Lungs:  Clear throughout to auscultation.  No wheezes, crackles, or rhonchi.  No increased WOB. Heart:  Regular rate and rhythm; no murmurs, clicks, rubs, or gallops. Abdomen:  Soft, non-distended.  BS present.  Moderate LLQ TTP. Rectal:  Deferred  Msk:  Symmetrical without gross deformities. Pulses:  Normal pulses noted. Extremities:  Without clubbing or edema. Neurologic:  Alert and  oriented x4;  grossly normal neurologically. Skin:  Intact without significant lesions or rashes. Psych:  Alert and cooperative. Normal mood and affect.  Intake/Output from previous day: 07/19 0701 - 07/20 0700 In: 182.5 [I.V.:32.5; IV Piggyback:150] Out: -  Intake/Output this shift: Total I/O In: 120 [P.O.:120] Out: 100 [Urine:100]  Lab Results:  Recent Labs  06/22/17 2142 06/23/17 0741  WBC 11.7* 11.8*  HGB 11.7* 12.0  HCT 35.6* 36.5  PLT 225 228    BMET  Recent Labs  06/22/17 2142  NA 131*  K 4.3  CL 98*  CO2 23  GLUCOSE 138*  BUN 16  CREATININE 0.88  CALCIUM 8.1*   LFT  Recent Labs  06/22/17 2142  PROT 7.7  ALBUMIN 3.3*  AST 24  ALT 21  ALKPHOS 88  BILITOT 0.8   Studies/Results: Dg Chest 2 View  Result Date: 06/23/2017 CLINICAL DATA:  Fatigue and multiple falls at home. EXAM: CHEST  2 VIEW COMPARISON:  CT from 05/29/2017, CXR 11/03/2016 FINDINGS: The heart size and mediastinal contours are within normal limits. Aortic atherosclerosis at the arch without aneurysm is again seen. There is subsegmental atelectasis and/or scarring noted in in the left lower lobe and lingula. Mild hilar prominence on the right is believed to be a vascular summation based on recent CT. The visualized skeletal structures are unremarkable. Port catheter tip terminates in the distal SVC. IMPRESSION: No active cardiopulmonary disease. Lingular and left basilar atelectasis and/or scarring. Electronically Signed   By: Ashley Royalty M.D.   On: 06/23/2017 00:21   Ct Head Wo Contrast  Result Date: 06/23/2017 CLINICAL DATA:  76 year old female with weakness. EXAM: CT HEAD WITHOUT CONTRAST TECHNIQUE: Contiguous axial images were obtained from  the base of the skull through the vertex without intravenous contrast. COMPARISON:  Head CT dated 09/07/2016 FINDINGS: Brain: There is stable cortical atrophy. Minimal periventricular and deep white matter chronic microvascular ischemic changes noted. There is no acute intracranial hemorrhage. No mass effect or midline shift. No extra-axial fluid collection. Vascular: No hyperdense vessel or unexpected calcification. Skull: Normal. Negative for fracture or focal lesion. Sinuses/Orbits: There is opacification of the majority of the left sphenoid sinus. The remainder of the visualized paranasal sinuses and mastoid air cells are clear. Other: None IMPRESSION: No acute intracranial pathology.  Stable cortical atrophy.  Electronically Signed   By: Anner Crete M.D.   On: 06/23/2017 01:36   Ct Abdomen Pelvis W Contrast  Result Date: 06/23/2017 CLINICAL DATA:  Left lower quadrant pain x2 days with leukocytosis. EXAM: CT ABDOMEN AND PELVIS WITH CONTRAST TECHNIQUE: Multidetector CT imaging of the abdomen and pelvis was performed using the standard protocol following bolus administration of intravenous contrast. CONTRAST:  100 cc Isovue-300 COMPARISON:  05/29/2017 FINDINGS: Lower chest: The included heart is normal in size without pericardial effusion. There is bibasilar atelectasis and/or scarring. No effusion or pneumothorax. Small hiatal hernia. Hepatobiliary: Physiologic distention of the gallbladder without stones. Scattered stable water attenuating cysts and/or tiny hemangiomata of the liver, the largest are adjacent to the gallbladder fossa measuring approximately 10 and 11 mm diameter. Some are too small to further characterize however. There is no biliary dilatation. Pancreas: Atrophic pancreas without inflammation. Spleen: Normal Adrenals/Urinary Tract: Normal bilateral adrenal glands. No enhancing renal mass or obstructive uropathy. No nephrolithiasis. Stomach/Bowel: There is diffuse transmural thickening of the descending colon through rectosigmoid consistent with colitis. No bowel obstruction is seen. A moderate amount of fecal retention is seen within the ascending and transverse colon. There mild fluid-filled distention of small bowel is noted without obstruction. Stomach is contracted. The appendix is not visualized. No pericecal inflammation is noted however. Vascular/Lymphatic: Aortoiliac atherosclerosis. No pathologically enlarged lymph nodes by CT size criteria. Reproductive: Hysterectomy.  No adnexal mass. Other: No free air nor free fluid.  No hernia. Musculoskeletal: Ununited chronic pathologic fracture involving the left iliac bone with coarsening and thickening of the cortex, mixed lytic and sclerotic  appearance with diffuse bony expansion presumably representing postradiation change. Chronic posterior left lower rib fractures. IMPRESSION: 1. Colitis of the descending through rectosigmoid colon. 2. Mixed lytic and blastic lesion of the left iliac bone with pathologic fracture, bony expansion and thickening of the cortex is noted as before. Chronic left posterior rib fractures with healing. 3. Incidental findings of hepatic cysts and/or hemangiomata and aortoiliac atherosclerosis. Electronically Signed   By: Ashley Royalty M.D.   On: 06/23/2017 01:51   IMPRESSION:  -LLQ abdominal pain with colitis on CT scan:  Not really having diarrhea.  Has history of Cdiff and medication induced colitis.  Has remained on Lialda for treatment of that but has been off of cancer treatment for about one year. -Metastatic non-small cell lung cancer:  On observation. -Barrett's esophagus with at least high grade dysplasia:  Was going to be seen on [facility] for removal but she has chosen not to do so.  PLAN: -Check stool studies. -Continue flagyl for now. -Continue full liquids for now. -May need to consider flex sig at some point to help sort out what is going on now since she is no longer on her cancer medication.   Ryon Layton D.  06/23/2017, 1:34 PM  Pager number [phone]

## 2017-06-23 NOTE — Evaluation (Signed)
Physical Therapy Evaluation Patient Details Name: Cindy Byrd MRN: [id number] DOB: Jun 13, 1941 Today's Date: 06/23/2017   History of Present Illness  76 y.o. female with history of metastatic non-small cell lung cancer under observation, pulmonary embolism on Lovenox, Nivolumab induced colitis presents to the ER with complaints of left lower quadrant pain.  Pt found to have colitis and left Iliac Bone lytic lesion (old finding).  Clinical Impression  Patient evaluated by Physical Therapy with no further acute PT needs identified. All education has been completed and the patient has no further questions.  Pt reports she lives with "Tom" and couple small dogs.  Pt reports mobility and pain improved since admission.  Pt has RW and SPC at home and agreeable to use RW upon d/c for safety initially.  Pt with possible d/c home tomorrow. See below for any follow-up Physical Therapy or equipment needs. PT is signing off. Thank you for this referral.     Follow Up Recommendations No PT follow up    Equipment Recommendations  None recommended by PT    Recommendations for Other Services       Precautions / Restrictions Precautions Precautions: None Restrictions Other Position/Activity Restrictions: WBAT per Dr. Tyrell Antonio      Mobility  Bed Mobility Overal bed mobility: Modified Independent                Transfers Overall transfer level: Needs assistance Equipment used: None Transfers: Sit to/from Stand Sit to Stand: Supervision         General transfer comment: supervision for safety/lines  Ambulation/Gait Ambulation/Gait assistance: Supervision;Min guard Ambulation Distance (Feet): 200 Feet Assistive device: None Gait Pattern/deviations: Step-through pattern     General Gait Details: increased lateral trunk sway possibly due to LLD, pt reports no pain issues, hold IV pole with both UEs, discussed using RW for safety at home upon d/c  Stairs             Wheelchair Mobility    Modified Rankin (Stroke Patients Only)       Balance Overall balance assessment: History of Falls                                           Pertinent Vitals/Pain Pain Assessment: No/denies pain    Home Living Family/patient expects to be discharged to:: Private residence Living Arrangements: Spouse/significant other Available Help at Discharge: Family;Friend(s) Type of Home: House Home Access: Stairs to enter Entrance Stairs-Rails: Right Entrance Stairs-Number of Steps: 4 Home Layout: Able to live on main level with bedroom/bathroom Home Equipment: Walker - 2 wheels;Cane - single point      Prior Function Level of Independence: Independent with assistive device(s)         Comments: pt uses cane in community     Hand Dominance        Extremity/Trunk Assessment        Lower Extremity Assessment Lower Extremity Assessment: Overall WFL for tasks assessed (possible LLD, L LE shorter, radiation to L hip/pelvis area per pt)       Communication   Communication: No difficulties  Cognition Arousal/Alertness: Awake/alert Behavior During Therapy: WFL for tasks assessed/performed Overall Cognitive Status: Within Functional Limits for tasks assessed  General Comments      Exercises     Assessment/Plan    PT Assessment Patent does not need any further PT services  PT Problem List         PT Treatment Interventions      PT Goals (Current goals can be found in the Care Plan section)  Acute Rehab PT Goals PT Goal Formulation: All assessment and education complete, DC therapy    Frequency     Barriers to discharge        Co-evaluation               AM-PAC PT "6 Clicks" Daily Activity  Outcome Measure Difficulty turning over in bed (including adjusting bedclothes, sheets and blankets)?: None Difficulty moving from lying on back to sitting on the  side of the bed? : None Difficulty sitting down on and standing up from a chair with arms (e.g., wheelchair, bedside commode, etc,.)?: None Help needed moving to and from a bed to chair (including a wheelchair)?: None Help needed walking in hospital room?: A Little Help needed climbing 3-5 steps with a railing? : A Little 6 Click Score: 22    End of Session   Activity Tolerance: Patient tolerated treatment well Patient left: in chair;with call bell/phone within reach Nurse Communication: Mobility status (aware pt left of O2 (SPO2 98% room air after ambulating)) PT Visit Diagnosis: Difficulty in walking, not elsewhere classified (R26.2)    Time: 7616-0737 PT Time Calculation (min) (ACUTE ONLY): 18 min   Charges:   PT Evaluation $PT Eval Low Complexity: 1 Procedure     PT G Codes:   PT G-Codes **NOT FOR INPATIENT CLASS** Functional Assessment Tool Used: AM-PAC 6 Clicks Basic Mobility;Clinical judgement Functional Limitation: Mobility: Walking and moving around Mobility: Walking and Moving Around Current Status (T0626): At least 1 percent but less than 20 percent impaired, limited or restricted Mobility: Walking and Moving Around Goal Status [phone]): At least 1 percent but less than 20 percent impaired, limited or restricted Mobility: Walking and Moving Around Discharge Status [phone]): At least 1 percent but less than 20 percent impaired, limited or restricted    Carmelia Bake, PT, DPT 06/23/2017 Pager: [phone]  York Ram E 06/23/2017, 4:00 PM

## 2017-06-23 NOTE — Progress Notes (Signed)
PROGRESS NOTE    Cindy Byrd  ITG:[id number] DOB: 21-Mar-1941 DOA: 06/22/2017 PCP: Tamsen Roers, MD   Brief Narrative:  Cindy Byrd is a 76 y.o. female with history of metastatic non-small cell lung cancer under observation, pulmonary embolism on Lovenox, Nivolumab induced colitis presents to the ER with complaints of left lower quadrant pain. Patient states patient has been having persistent left lower quadrant pain over the last 3 days.  CT abdomen and pelvis shows left sided iliac fracture and colitis and rib fractures.  Assessment & Plan:   Principal Problem:   Left lower quadrant pain Active Problems:   Bilateral pulmonary embolism (HCC)   Colitis   Fracture of left iliac crest (HCC)  1-Colitis;  Presents with abdominal pain. Report 3 BM per day. Last BM was form, per nurse report.  Will continue with flagyl. Hold on ceftriaxone due to history of C diff.  Check GI pathogen if diarrhea.  She has been off Budesonide.  Will consult GI, does patient needs colonoscopy ? Should we resume budesonide.   2-Iliac Bone lytic lesion. Discussed with Dr Irene Limbo these are old finding.   3-Hyponatremia; Continue with IV fluids.   4-History of pulmonary embolism - on Lovenox.  DVT prophylaxis: Lovenox Code Status: DNR Family Communication: care discussed with patient.  Disposition Plan: to be determine.    Consultants:   Dr Irene Limbo  GI     Procedures: none   Antimicrobials:   Flagyl     Subjective: She is complaining of left lower quadrant abdominal pain, having pain for a week now. Report 3 BM per day. Probably her baseline.  She is off budesonide.  She gets some injection for her diarrhea.    Objective: Vitals:   06/23/17 0330 06/23/17 0345 06/23/17 0439 06/23/17 0902  BP: (!) 143/69 134/75 (!) 154/84   Pulse:   75   Resp: (!) 30 (!) 28 (!) 26   Temp:   98.8 F (37.1 C)   TempSrc:   Oral   SpO2:   100%   Weight:    59.9 kg (132 lb 0.9 oz)  Height:         Intake/Output Summary (Last 24 hours) at 06/23/17 1159 Last data filed at 06/23/17 1053  Gross per 24 hour  Intake            302.5 ml  Output              100 ml  Net            202.5 ml   Filed Weights   06/22/17 2048 06/23/17 0902  Weight: 57.2 kg (126 lb) 59.9 kg (132 lb 0.9 oz)    Examination:  General exam: Appears calm and comfortable  Respiratory system: Clear to auscultation. Respiratory effort normal. Cardiovascular system: S1 & S2 heard, RRR. No JVD, murmurs, rubs, gallops or clicks. No pedal edema. Gastrointestinal system: Abdomen is nondistended, soft and tender left lower quadrant.  No organomegaly or masses felt. Normal bowel sounds heard. Central nervous system: Alert and oriented. No focal neurological deficits. Extremities: Symmetric 5 x 5 power. Skin: No rashes, lesions or ulcers Psychiatry: Judgement and insight appear normal. Mood & affect appropriate.     Data Reviewed: I have personally reviewed following labs and imaging studies  CBC:  Recent Labs Lab 06/22/17 2142 06/23/17 0741  WBC 11.7* 11.8*  NEUTROABS  --  8.2*  HGB 11.7* 12.0  HCT 35.6* 36.5  MCV 86.2 85.7  PLT  225 606   Basic Metabolic Panel:  Recent Labs Lab 06/22/17 2142  NA 131*  K 4.3  CL 98*  CO2 23  GLUCOSE 138*  BUN 16  CREATININE 0.88  CALCIUM 8.1*   GFR: Estimated Creatinine Clearance: 50.6 mL/min (by C-G formula based on SCr of 0.88 mg/dL). Liver Function Tests:  Recent Labs Lab 06/22/17 2142  AST 24  ALT 21  ALKPHOS 88  BILITOT 0.8  PROT 7.7  ALBUMIN 3.3*    Recent Labs Lab 06/22/17 2142  LIPASE 17   No results for input(s): AMMONIA in the last 168 hours. Coagulation Profile: No results for input(s): INR, PROTIME in the last 168 hours. Cardiac Enzymes: No results for input(s): CKTOTAL, CKMB, CKMBINDEX, TROPONINI in the last 168 hours. BNP (last 3 results) No results for input(s): PROBNP in the last 8760 hours. HbA1C: No results for  input(s): HGBA1C in the last 72 hours. CBG: No results for input(s): GLUCAP in the last 168 hours. Lipid Profile: No results for input(s): CHOL, HDL, LDLCALC, TRIG, CHOLHDL, LDLDIRECT in the last 72 hours. Thyroid Function Tests: No results for input(s): TSH, T4TOTAL, FREET4, T3FREE, THYROIDAB in the last 72 hours. Anemia Panel: No results for input(s): VITAMINB12, FOLATE, FERRITIN, TIBC, IRON, RETICCTPCT in the last 72 hours. Sepsis Labs:  Recent Labs Lab 06/23/17 0741  LATICACIDVEN 1.0    No results found for this or any previous visit (from the past 240 hour(s)).       Radiology Studies: Dg Chest 2 View  Result Date: 06/23/2017 CLINICAL DATA:  Fatigue and multiple falls at home. EXAM: CHEST  2 VIEW COMPARISON:  CT from 05/29/2017, CXR 11/03/2016 FINDINGS: The heart size and mediastinal contours are within normal limits. Aortic atherosclerosis at the arch without aneurysm is again seen. There is subsegmental atelectasis and/or scarring noted in in the left lower lobe and lingula. Mild hilar prominence on the right is believed to be a vascular summation based on recent CT. The visualized skeletal structures are unremarkable. Port catheter tip terminates in the distal SVC. IMPRESSION: No active cardiopulmonary disease. Lingular and left basilar atelectasis and/or scarring. Electronically Signed   By: Ashley Royalty M.D.   On: 06/23/2017 00:21   Ct Head Wo Contrast  Result Date: 06/23/2017 CLINICAL DATA:  76 year old female with weakness. EXAM: CT HEAD WITHOUT CONTRAST TECHNIQUE: Contiguous axial images were obtained from the base of the skull through the vertex without intravenous contrast. COMPARISON:  Head CT dated 09/07/2016 FINDINGS: Brain: There is stable cortical atrophy. Minimal periventricular and deep white matter chronic microvascular ischemic changes noted. There is no acute intracranial hemorrhage. No mass effect or midline shift. No extra-axial fluid collection. Vascular: No  hyperdense vessel or unexpected calcification. Skull: Normal. Negative for fracture or focal lesion. Sinuses/Orbits: There is opacification of the majority of the left sphenoid sinus. The remainder of the visualized paranasal sinuses and mastoid air cells are clear. Other: None IMPRESSION: No acute intracranial pathology.  Stable cortical atrophy. Electronically Signed   By: Anner Crete M.D.   On: 06/23/2017 01:36   Ct Abdomen Pelvis W Contrast  Result Date: 06/23/2017 CLINICAL DATA:  Left lower quadrant pain x2 days with leukocytosis. EXAM: CT ABDOMEN AND PELVIS WITH CONTRAST TECHNIQUE: Multidetector CT imaging of the abdomen and pelvis was performed using the standard protocol following bolus administration of intravenous contrast. CONTRAST:  100 cc Isovue-300 COMPARISON:  05/29/2017 FINDINGS: Lower chest: The included heart is normal in size without pericardial effusion. There is bibasilar atelectasis and/or  scarring. No effusion or pneumothorax. Small hiatal hernia. Hepatobiliary: Physiologic distention of the gallbladder without stones. Scattered stable water attenuating cysts and/or tiny hemangiomata of the liver, the largest are adjacent to the gallbladder fossa measuring approximately 10 and 11 mm diameter. Some are too small to further characterize however. There is no biliary dilatation. Pancreas: Atrophic pancreas without inflammation. Spleen: Normal Adrenals/Urinary Tract: Normal bilateral adrenal glands. No enhancing renal mass or obstructive uropathy. No nephrolithiasis. Stomach/Bowel: There is diffuse transmural thickening of the descending colon through rectosigmoid consistent with colitis. No bowel obstruction is seen. A moderate amount of fecal retention is seen within the ascending and transverse colon. There mild fluid-filled distention of small bowel is noted without obstruction. Stomach is contracted. The appendix is not visualized. No pericecal inflammation is noted however.  Vascular/Lymphatic: Aortoiliac atherosclerosis. No pathologically enlarged lymph nodes by CT size criteria. Reproductive: Hysterectomy.  No adnexal mass. Other: No free air nor free fluid.  No hernia. 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 Chronic posterior left lower rib fractures. IMPRESSION: 1. Colitis of the descending through rectosigmoid colon. 2. Mixed lytic and blastic lesion of the left iliac bone with pathologic fracture, bony expansion and thickening of the cortex is noted as before. Chronic left posterior rib fractures with healing. 3. Incidental findings of hepatic cysts and/or hemangiomata and aortoiliac atherosclerosis. Electronically Signed   By: Ashley Royalty M.D.   On: 06/23/2017 01:51        Scheduled Meds: . amitriptyline  25 mg Oral QHS  . calcium-vitamin D  2 tablet Oral TID  . citalopram  20 mg Oral Daily  . enoxaparin (LOVENOX) injection  1.5 mg/kg Subcutaneous Q24H  . feeding supplement (ENSURE ENLIVE)  237 mL Oral BID BM  . mesalamine  4.8 g Oral Q breakfast  . pantoprazole  40 mg Oral Daily  . potassium chloride SA  20 mEq Oral Daily   Continuous Infusions: . sodium chloride 75 mL/hr at 06/23/17 0600  . [START ON 06/24/2017] cefTRIAXone (ROCEPHIN)  IV    . metronidazole 500 mg (06/23/17 1115)     LOS: 0 days    Time spent: 35 minutes.     Elmarie Shiley, MD [facility] Pager [phone]  If 7PM-7AM, please contact night-coverage [email or web address] Password [email or web address] 06/23/2017, 11:59 AM

## 2017-06-23 NOTE — Progress Notes (Signed)
Spoke with pt there at no HH needs at present time.

## 2017-06-23 NOTE — ED Notes (Signed)
Patient transported to CT 

## 2017-06-23 NOTE — Telephone Encounter (Signed)
Call received from [facility] in reference to this patient.  Would like return call from [facility] provider.  Return number [phone].

## 2017-06-23 NOTE — ED Notes (Signed)
EKG given to EDP,Wickline,MD., for review.

## 2017-06-23 NOTE — Progress Notes (Signed)
Pharmacy Antibiotic Note  Cindy Byrd is a 76 y.o. female admitted on 06/22/2017 with intra-abdominal infection. Pharmacy has been consulted for ceftriaxone dosing.  Plan: Ceftriaxone 2gm IV q24h Flagyl 500mg  IV q8h per MD Pharmacy will sign off as no adjustment needed in renal dysfunction  Height: 5\' 8"  (172.7 cm) Weight: 126 lb (57.2 kg) IBW/kg (Calculated) : 63.9  Temp (24hrs), Avg:99.1 F (37.3 C), Min:98.8 F (37.1 C), Max:99.3 F (37.4 C)   Recent Labs Lab 06/22/17 2142  WBC 11.7*  CREATININE 0.88    Estimated Creatinine Clearance: 48.3 mL/min (by C-G formula based on SCr of 0.88 mg/dL).    Allergies  Allergen Reactions  . Penicillins Other (See Comments)    Unknown; childhood allergy  . Remeron [Mirtazapine] Other (See Comments)    nightmares  . Latex Rash     Thank you for allowing pharmacy to be a part of this patient's care.  Dolly Rias RPh 06/23/2017, 6:09 AM Pager [phone]

## 2017-06-23 NOTE — Care Management Obs Status (Signed)
[place] NOTIFICATION   Patient Details  Name: Cindy Byrd MRN: [id number] Date of Birth: [birth year]   Medicare Observation Status Notification Given:  Yes    Purcell Mouton, RN 06/23/2017, 1:00 PM

## 2017-06-23 NOTE — Progress Notes (Signed)
PT Cancellation Note  Patient Details Name: Cindy Byrd MRN: [id number] DOB: 1941/10/26    Reason Eval/Treat Not Completed: Other (comment) Pt found to have left iliac pathologic fracture.  Please place weight bearing order for PT to evaluate pt.   Dequon Schnebly,KATHrine E 06/23/2017, 10:58 AM Carmelia Bake, PT, DPT 06/23/2017 Pager: [phone]

## 2017-06-23 NOTE — Progress Notes (Signed)
ANTICOAGULATION CONSULT NOTE - Initial Consult  Pharmacy Consult for Lovenox Indication: history of PE  Allergies  Allergen Reactions  . Penicillins Other (See Comments)    Unknown; childhood allergy  . Remeron [Mirtazapine] Other (See Comments)    nightmares  . Latex Rash    Patient Measurements: Height: 5\' 8"  (172.7 cm) Weight: 126 lb (57.2 kg) IBW/kg (Calculated) : 63.9  Vital Signs: Temp: 98.8 F (37.1 C) (07/20 0439) Temp Source: Oral (07/20 0439) BP: 154/84 (07/20 0439) Pulse Rate: 75 (07/20 0439)  Labs:  Recent Labs  06/22/17 2142  HGB 11.7*  HCT 35.6*  PLT 225  CREATININE 0.88    Estimated Creatinine Clearance: 48.3 mL/min (by C-G formula based on SCr of 0.88 mg/dL).   Assessment: 35 yoF with history of metastatic NSCL cancer under observation, pulmonary embolism in 2017 on Lovenox, admitted with abdominal pain likely due to colitis.  Pharmacy consulted to manage Lovenox.  Patient on Lovenox 80 mg SQ q24h PTA.  Appears from chart review that patient was switched to the 1.5 mg/kg daily Lovenox regimen from the q12h regimen by her hematology/oncology office. Will continue q24h dosing regimen as PTA but will adjust slightly for current weight.  Current weight = 57 kg.  CrCl>30 ml/min.  Hgb, platelets ok.  Last Lovenox dose reported as 80 mg administered 7/18 at 0900.  Goal of Therapy:  Anti-Xa level 0.6-1 units/ml 4hrs after LMWH dose given Monitor platelets by anticoagulation protocol: Yes   Plan:  Lovenox 1.5 mg/kg (85 kg) SQ q24h.  Check CBC at least q72h. Recheck another weight to make sure admission weight accurate.  Hershal Coria 06/23/2017,8:01 AM

## 2017-06-23 NOTE — Progress Notes (Signed)
Initial Nutrition Assessment  INTERVENTION:   Continue Ensure Enlive po BID, each supplement provides 350 kcal and 20 grams of protein RD will continue to monitor for plan  NUTRITION DIAGNOSIS:   Increased nutrient needs related to chronic illness, cancer and cancer related treatments as evidenced by estimated needs.  GOAL:   Patient will meet greater than or equal to 90% of their needs  MONITOR:   PO intake, Supplement acceptance, Labs, Weight trends, I & O's  REASON FOR ASSESSMENT:   Malnutrition Screening Tool    ASSESSMENT:   76 y.o. female with history of metastatic non-small cell lung cancer under observation, pulmonary embolism on Lovenox, Nivolumab induced colitis presents to the ER with complaints of left lower quadrant pain. Patient states patient has been having persistent left lower quadrant pain over the last 3 days. Denies any nausea vomiting or diarrhea. Denies any chest pain or shortness of breath. Patient did say that she had a fall yesterday where she did hit her head but did not lose consciousness. Patient states she has been having frequent falls.   Patient consumed 50% of breakfast this morning which consisted of eggs, bacon, and toast. Pt with Lt sided iliac crest fracture. Per chart review, pt has gained weight recently. Weights have been trending in the 120's PTA. Pt has been ordered Ensure supplements and is drinking them.  Diet order was discontinued. Will monitor for plan. Nutrition focused physical exam shows no sign of depletion of muscle mass or body fat. Upper body examined only d/t fracture.  Medications: OSCAL-D tablet TID, Protonix tablet daily, K-DUR tablet daily, IV Zofran PRN Labs reviewed: Low Na  Diet Order:     Skin:  Reviewed, no issues  Last BM:  7/20  Height:   Ht Readings from Last 1 Encounters:  06/22/17 5\' 8"  (1.727 m)    Weight:   Wt Readings from Last 1 Encounters:  06/23/17 132 lb 0.9 oz (59.9 kg)    Ideal Body  Weight:  63.6 kg  BMI:  Body mass index is 20.08 kg/m.  Estimated Nutritional Needs:   Kcal:  1650-1850  Protein:  70-80g  Fluid:  1.8L/day  EDUCATION NEEDS:   No education needs identified at this time  [name] Bibles, MS, RD, LDN Pager: [phone] After Hours Pager: [phone]

## 2017-06-24 ENCOUNTER — Encounter (HOSPITAL_COMMUNITY)
Admission: EM | Disposition: A | Discharge status: Home / Self Care | Payer: Self-pay | Source: Home / Self Care | Attending: Emergency Medicine

## 2017-06-24 ENCOUNTER — Encounter (HOSPITAL_COMMUNITY): Payer: Self-pay

## 2017-06-24 DIAGNOSIS — R1032 Left lower quadrant pain: Secondary | ICD-10-CM | POA: Diagnosis not present

## 2017-06-24 DIAGNOSIS — K6389 Other specified diseases of intestine: Secondary | ICD-10-CM

## 2017-06-24 DIAGNOSIS — K529 Noninfective gastroenteritis and colitis, unspecified: Secondary | ICD-10-CM | POA: Diagnosis not present

## 2017-06-24 DIAGNOSIS — C3490 Malignant neoplasm of unspecified part of unspecified bronchus or lung: Secondary | ICD-10-CM

## 2017-06-24 HISTORY — PX: FLEXIBLE SIGMOIDOSCOPY: SHX5431

## 2017-06-24 LAB — GASTROINTESTINAL PANEL BY PCR, STOOL (REPLACES STOOL CULTURE)
ADENOVIRUS F40/41: NOT DETECTED
Astrovirus: NOT DETECTED
CAMPYLOBACTER SPECIES: NOT DETECTED
CRYPTOSPORIDIUM: NOT DETECTED
CYCLOSPORA CAYETANENSIS: NOT DETECTED
ENTEROPATHOGENIC E COLI (EPEC): NOT DETECTED
Entamoeba histolytica: NOT DETECTED
Enteroaggregative E coli (EAEC): NOT DETECTED
Enterotoxigenic E coli (ETEC): NOT DETECTED
GIARDIA LAMBLIA: NOT DETECTED
Norovirus GI/GII: NOT DETECTED
PLESIMONAS SHIGELLOIDES: NOT DETECTED
Rotavirus A: NOT DETECTED
SALMONELLA SPECIES: NOT DETECTED
SHIGELLA/ENTEROINVASIVE E COLI (EIEC): NOT DETECTED
Sapovirus (I, II, IV, and V): NOT DETECTED
Shiga like toxin producing E coli (STEC): NOT DETECTED
VIBRIO SPECIES: NOT DETECTED
Vibrio cholerae: NOT DETECTED
YERSINIA ENTEROCOLITICA: NOT DETECTED

## 2017-06-24 LAB — COMPREHENSIVE METABOLIC PANEL
ALK PHOS: 65 U/L (ref 38–126)
ALT: 15 U/L (ref 14–54)
AST: 15 U/L (ref 15–41)
Albumin: 2.6 g/dL — ABNORMAL LOW (ref 3.5–5.0)
Anion gap: 8 (ref 5–15)
BILIRUBIN TOTAL: 0.3 mg/dL (ref 0.3–1.2)
BUN: 16 mg/dL (ref 6–20)
CALCIUM: 7.5 mg/dL — AB (ref 8.9–10.3)
CO2: 25 mmol/L (ref 22–32)
CREATININE: 1 mg/dL (ref 0.44–1.00)
Chloride: 104 mmol/L (ref 101–111)
GFR calc Af Amer: >60 mL/min (ref >60)
GFR, EST NON AFRICAN AMERICAN: 53 mL/min — AB (ref >60)
GLUCOSE: 115 mg/dL — AB (ref 65–99)
POTASSIUM: 4.3 mmol/L (ref 3.5–5.1)
Sodium: 137 mmol/L (ref 135–145)
TOTAL PROTEIN: 6.2 g/dL — AB (ref 6.5–8.1)

## 2017-06-24 SURGERY — SIGMOIDOSCOPY, FLEXIBLE
Anesthesia: Moderate Sedation

## 2017-06-24 MED ORDER — VANCOMYCIN 50 MG/ML ORAL SOLUTION
125.0000 mg | Freq: Four times a day (QID) | ORAL | Status: DC
  Administered 2017-06-24: 125 mg via ORAL
  Filled 2017-06-24 (×2): qty 2.5

## 2017-06-24 MED ORDER — VANCOMYCIN HCL 125 MG PO CAPS: 125.0000 mg | ORAL_CAPSULE | Freq: Four times a day (QID) | ORAL | 0 refills | Status: DC

## 2017-06-24 MED ORDER — ENOXAPARIN SODIUM 80 MG/0.8ML ~~LOC~~ SOLN: 80.0000 mg | SUBCUTANEOUS | 1 refills | Status: DC

## 2017-06-24 MED ORDER — VANCOMYCIN 50 MG/ML ORAL SOLUTION: 125.0000 mg | Freq: Four times a day (QID) | ORAL | 0 refills | Status: DC

## 2017-06-24 NOTE — Progress Notes (Signed)
PROGRESS NOTE    Cindy Byrd  GTX:[id number] DOB: [birth year] DOA: 06/22/2017 PCP: Tamsen Roers, MD   Brief Narrative:  Cindy Byrd is a 76 y.o. female with history of metastatic non-small cell lung cancer under observation, pulmonary embolism on Lovenox, Nivolumab induced colitis presents to the ER with complaints of left lower quadrant pain. Patient states patient has been having persistent left lower quadrant pain over the last 3 days.  CT abdomen and pelvis shows left sided iliac fracture and colitis and rib fractures.  Assessment & Plan:   Principal Problem:   Left lower quadrant pain Active Problems:   Bilateral pulmonary embolism (HCC)   Colitis   Fracture of left iliac crest (HCC)   Malignant neoplasm of lung ([place])  1-Colitis;  Presents with abdominal pain. Report 3 BM per day.  Will continue with flagyl. Hold on ceftriaxone due to history of C diff.  Check GI pathogen pending She has been off Budesonide.  Plan for sigmoidoscopy today. Appreciate Dr Havery Moros help   2-Iliac Bone lytic lesion. Discussed with Dr Irene Limbo these are old finding.   3-Hyponatremia; Continue with IV fluids.   4-History of pulmonary embolism - on Lovenox.  DVT prophylaxis: Lovenox Code Status: DNR Family Communication: care discussed with patient.  Disposition Plan: to be determine.    Consultants:   Dr Irene Limbo  GI     Procedures: none   Antimicrobials:   Flagyl     Subjective: She is still complaining of left Lower quadrant pain    Objective: Vitals:   06/24/17 0506 06/24/17 1227 06/24/17 1235 06/24/17 1245  BP: 101/80 (!) 91/36 (!) 132/35 (!) 127/45  Pulse: 76 68 76   Resp: 18 12 14 20   Temp: 98.5 F (36.9 C) 97.8 F (36.6 C)    TempSrc: Oral Oral    SpO2: 96% 96% 96%   Weight:      Height:        Intake/Output Summary (Last 24 hours) at 06/24/17 1259 Last data filed at 06/24/17 1042  Gross per 24 hour  Intake           1742.5 ml  Output               350 ml  Net           1392.5 ml   Filed Weights   06/22/17 2048 06/23/17 0902  Weight: 57.2 kg (126 lb) 59.9 kg (132 lb 0.9 oz)    Examination:  General exam: NAD Respiratory system: CTA Cardiovascular system: S 1, S 2 RRR Gastrointestinal system: soft, left lower quadrant tenderness Central nervous system: non focal.  Extremities: Symmetric power.  Skin: No rashes, lesions or ulcers     Data Reviewed: I have personally reviewed following labs and imaging studies  CBC:  Recent Labs Lab 06/22/17 2142 06/23/17 0741  WBC 11.7* 11.8*  NEUTROABS  --  8.2*  HGB 11.7* 12.0  HCT 35.6* 36.5  MCV 86.2 85.7  PLT 225 248   Basic Metabolic Panel:  Recent Labs Lab 06/22/17 2142 06/24/17 0355  NA 131* 137  K 4.3 4.3  CL 98* 104  CO2 23 25  GLUCOSE 138* 115*  BUN 16 16  CREATININE 0.88 1.00  CALCIUM 8.1* 7.5*   GFR: Estimated Creatinine Clearance: 44.6 mL/min (by C-G formula based on SCr of 1 mg/dL). Liver Function Tests:  Recent Labs Lab 06/22/17 2142 06/24/17 0355  AST 24 15  ALT 21 15  ALKPHOS 88 65  BILITOT 0.8 0.3  PROT 7.7 6.2*  ALBUMIN 3.3* 2.6*    Recent Labs Lab 06/22/17 2142  LIPASE 17   No results for input(s): AMMONIA in the last 168 hours. Coagulation Profile: No results for input(s): INR, PROTIME in the last 168 hours. Cardiac Enzymes: No results for input(s): CKTOTAL, CKMB, CKMBINDEX, TROPONINI in the last 168 hours. BNP (last 3 results) No results for input(s): PROBNP in the last 8760 hours. HbA1C: No results for input(s): HGBA1C in the last 72 hours. CBG: No results for input(s): GLUCAP in the last 168 hours. Lipid Profile: No results for input(s): CHOL, HDL, LDLCALC, TRIG, CHOLHDL, LDLDIRECT in the last 72 hours. Thyroid Function Tests: No results for input(s): TSH, T4TOTAL, FREET4, T3FREE, THYROIDAB in the last 72 hours. Anemia Panel: No results for input(s): VITAMINB12, FOLATE, FERRITIN, TIBC, IRON, RETICCTPCT in the last  72 hours. Sepsis Labs:  Recent Labs Lab 06/23/17 0741  LATICACIDVEN 1.0    No results found for this or any previous visit (from the past 240 hour(s)).       Radiology Studies: Dg Chest 2 View  Result Date: 06/23/2017 CLINICAL DATA:  Fatigue and multiple falls at home. EXAM: CHEST  2 VIEW COMPARISON:  CT from 05/29/2017, CXR 11/03/2016 FINDINGS: The heart size and mediastinal contours are within normal limits. Aortic atherosclerosis at the arch without aneurysm is again seen. There is subsegmental atelectasis and/or scarring noted in in the left lower lobe and lingula. Mild hilar prominence on the right is believed to be a vascular summation based on recent CT. The visualized skeletal structures are unremarkable. Port catheter tip terminates in the distal SVC. IMPRESSION: No active cardiopulmonary disease. Lingular and left basilar atelectasis and/or scarring. Electronically Signed   By: Ashley Royalty M.D.   On: 06/23/2017 00:21   Ct Head Wo Contrast  Result Date: 06/23/2017 CLINICAL DATA:  76 year old female with weakness. EXAM: CT HEAD WITHOUT CONTRAST TECHNIQUE: Contiguous axial images were obtained from the base of the skull through the vertex without intravenous contrast. COMPARISON:  Head CT dated 09/07/2016 FINDINGS: Brain: There is stable cortical atrophy. Minimal periventricular and deep white matter chronic microvascular ischemic changes noted. There is no acute intracranial hemorrhage. No mass effect or midline shift. No extra-axial fluid collection. Vascular: No hyperdense vessel or unexpected calcification. Skull: Normal. Negative for fracture or focal lesion. Sinuses/Orbits: There is opacification of the majority of the left sphenoid sinus. The remainder of the visualized paranasal sinuses and mastoid air cells are clear. Other: None IMPRESSION: No acute intracranial pathology.  Stable cortical atrophy. Electronically Signed   By: Anner Crete M.D.   On: 06/23/2017 01:36   Ct  Abdomen Pelvis W Contrast  Result Date: 06/23/2017 CLINICAL DATA:  Left lower quadrant pain x2 days with leukocytosis. EXAM: CT ABDOMEN AND PELVIS WITH CONTRAST TECHNIQUE: Multidetector CT imaging of the abdomen and pelvis was performed using the standard protocol following bolus administration of intravenous contrast. CONTRAST:  100 cc Isovue-300 COMPARISON:  05/29/2017 FINDINGS: Lower chest: The included heart is normal in size without pericardial effusion. There is bibasilar atelectasis and/or scarring. No effusion or pneumothorax. Small hiatal hernia. Hepatobiliary: Physiologic distention of the gallbladder without stones. Scattered stable water attenuating cysts and/or tiny hemangiomata of the liver, the largest are adjacent to the gallbladder fossa measuring approximately 10 and 11 mm diameter. Some are too small to further characterize however. There is no biliary dilatation. Pancreas: Atrophic pancreas without inflammation. Spleen: Normal Adrenals/Urinary Tract: Normal bilateral adrenal glands. No enhancing  renal mass or obstructive uropathy. No nephrolithiasis. Stomach/Bowel: There is diffuse transmural thickening of the descending colon through rectosigmoid consistent with colitis. No bowel obstruction is seen. A moderate amount of fecal retention is seen within the ascending and transverse colon. There mild fluid-filled distention of small bowel is noted without obstruction. Stomach is contracted. The appendix is not visualized. No pericecal inflammation is noted however. Vascular/Lymphatic: Aortoiliac atherosclerosis. No pathologically enlarged lymph nodes by CT size criteria. Reproductive: Hysterectomy.  No adnexal mass. Other: No free air nor free fluid.  No hernia. Musculoskeletal: Ununited chronic pathologic fracture involving the left iliac bone with coarsening and thickening of the cortex, mixed lytic and sclerotic appearance with diffuse bony expansion presumably representing postradiation  change. Chronic posterior left lower rib fractures. IMPRESSION: 1. Colitis of the descending through rectosigmoid colon. 2. Mixed lytic and blastic lesion of the left iliac bone with pathologic fracture, bony expansion and thickening of the cortex is noted as before. Chronic left posterior rib fractures with healing. 3. Incidental findings of hepatic cysts and/or hemangiomata and aortoiliac atherosclerosis. Electronically Signed   By: Ashley Royalty M.D.   On: 06/23/2017 01:51        Scheduled Meds: . [MAR Hold] amitriptyline  25 mg Oral QHS  . [MAR Hold] calcium-vitamin D  2 tablet Oral TID  . [MAR Hold] citalopram  20 mg Oral Daily  . [MAR Hold] enoxaparin (LOVENOX) injection  1.5 mg/kg Subcutaneous Q24H  . [MAR Hold] feeding supplement (ENSURE ENLIVE)  237 mL Oral BID BM  . [MAR Hold] mesalamine  4.8 g Oral Q breakfast  . [MAR Hold] pantoprazole  40 mg Oral Daily   Continuous Infusions: . [MAR Hold] metronidazole Stopped (06/24/17 1142)     LOS: 0 days    Time spent: 35 minutes.     Elmarie Shiley, MD [facility] Pager [phone]  If 7PM-7AM, please contact night-coverage [email or web address] Password [facility] 06/24/2017, 12:59 PM

## 2017-06-24 NOTE — Progress Notes (Signed)
Attempted to administer tap water enema but patient was not able to tolerate. Upon giving the first one, the enema immediately came running out. Tried several times and encouraged patient to hold it as best as she could but still no success. Endo made aware.

## 2017-06-24 NOTE — Progress Notes (Signed)
Cindy Byrd    HEMATOLOGY ONCOLOGY PROGRESS NOTE  Date of service: .05/25/2017  Patient Care Team: Tamsen Roers, MD as PCP - General (Family Medicine) [name], MD as Consulting Physician (Hematology and Oncology)  CHIEF COMPLAINTS/PURPOSE OF CONSULTATION: Follow-up for metastatic lung cancer  DIAGNOSIS:   #1 Metastatic non-small cell lung cancer with bilateral lung nodules and large metastatic lesion in the left Ilium. #2 Barrett's esophagus with some evidence of intramucosal adenocarcinoma of the esophagus. 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 Also had c diff colitis - treated   Current Treatment  1) Active surveillance 2) Xgeva 188m [place] q4weeks for bone metastases.  Previous Treatment  1 Palliative radiation therapy to the large left ilium metastases 2. IV Nivolumab x 20ycles (discontinued due to likely immune colitis) 3. Xgeva 1229mSC q4weeks for bone metastases.   HISTORY OF PRESENTING ILLNESS: (plz see my previous consultation for details of initial presentation)  INTERVAL HISTORY  Ms ToSzydlowskis here for her scheduled followup for metastatic lung cancer. She notes no significant diarrhea. Eating well. Overall energy levels have been good. No chest pain no shortness of breath. No abdominal pain. No abdominal pain. No GI bleeding. She notes that things are little better with her significant other with some decreased emotional stress. She has gained 4 pounds since her last visit No other acute new symptoms.  MEDICAL HISTORY:  Past Medical History:  Diagnosis Date  . Barrett's esophagus   . Bone neoplasm 06/24/2015  [facility]   metastatic poorly differentiated carcinoma. tumor left groin surgical removal with radiation tx.  . Cataract    BILATERAL  . Cigarette smoker two packs a day or less    Currently still smoking 2 PPD - Not interested in quitting at this time.    . Colitis 2017  . Colon polyps    hyperplastic, tubular adenomas, tubulovillous adenoma  . Cough, persistent    hx. lung cancer ? primary-being evaluated, unsure of primary site.  . Depression 06/24/2015  . Diverticulosis   . [place]  . Endometriosis    Hysterectomy with BSO at age [age]rs  . [facility]   intramucosal  . Gastritis   . GERD (gastroesophageal reflux disease)   . H/O: pneumonia   . Hiatal hernia   . Hyperlipidemia   . Hypertension 06/24/2015   likely improved incidental to 40 lbs weight loss from her neoplasm. No Longer taking med for this as of 08-06-15  . IBS (irritable bowel syndrome)   . Pain    left hip-persistent"tumor of bone"-radiation tx. 10.  . Vitamin D deficiency disease    SURGICAL HISTORY: Past Surgical History:  Procedure Laterality Date  . BARTHOLIN GLAND CYST EXCISION  5251o ago   Does not want if it was an infected cyst or tumor. Was soon as delivery  . COLONOSCOPY W/ POLYPECTOMY     multiple times - last done 09/2014 per patient.  . ESOPHAGOGASTRODUODENOSCOPY (EGD) WITH PROPOFOL N/A 08/11/2015   Procedure: ESOPHAGOGASTRODUODENOSCOPY (EGD) WITH PROPOFOL;  Surgeon: JaJerene BearsMD;  Location: [facility];  Service: Gastroenterology;  Laterality: N/A;  . GANGLION CYST EXCISION    . KNEE ARTHROSCOPY  age about [age]rs  . TONSILLECTOMY    . TOTAL ABDOMINAL HYSTERECTOMY W/ BILATERAL SALPINGOOPHORECTOMY  at age [age]rs   For endometriosis    SOCIAL HISTORY: Social History   Social  History  . Marital status: Widowed    Spouse name: N/A  . Number of children: 2  . Years of education: N/A   Occupational History  . Not on file.   Social History Main Topics  . Smoking status: Former Smoker    Packs/day: 1.00    Years: 60.00    Types: Cigarettes    Quit date: 12/06/2015  . Smokeless tobacco: Never Used  . Alcohol use No  . Drug use: No  . Sexual activity: No   Other Topics Concern  . Not on file    Social History Narrative  . No narrative on file    FAMILY HISTORY: Family History  Problem Relation Age of Onset  . Colon cancer Brother   . Colon cancer Brother   . Stroke Mother   . Colon cancer Father   . Breast cancer Daughter 61       ER/PR+ stage II    ALLERGIES:  is allergic to penicillins; remeron [mirtazapine]; and latex. patient wonders if she has a penicillin allergy but notes that she is uncertain about this.  MEDICATIONS:  No current facility-administered medications for this visit.    No current outpatient prescriptions on file.   Facility-Administered Medications Ordered in Other Visits  Medication Dose Route Frequency Provider Last Rate Last Dose  . acetaminophen (TYLENOL) tablet 650 mg  650 mg Oral Q6H PRN Rise Patience, MD       Or  . acetaminophen (TYLENOL) suppository 650 mg  650 mg Rectal Q6H PRN Rise Patience, MD      . amitriptyline (ELAVIL) tablet 25 mg  25 mg Oral QHS Rise Patience, MD   25 mg at 06/23/17 2012  . calcium-vitamin D (OSCAL WITH D) 500-200 MG-UNIT per tablet 2 tablet  2 tablet Oral TID Rise Patience, MD   2 tablet at 06/23/17 2012  . citalopram (CELEXA) tablet 20 mg  20 mg Oral Daily Rise Patience, MD   20 mg at 06/23/17 1115  . enoxaparin (LOVENOX) injection 85 mg  1.5 mg/kg Subcutaneous Q24H Dara Hoyer, RPH   85 mg at 06/23/17 0848  . feeding supplement (ENSURE ENLIVE) (ENSURE ENLIVE) liquid 237 mL  237 mL Oral BID BM Rise Patience, MD   237 mL at 06/23/17 1428  . LORazepam (ATIVAN) tablet 1 mg  1 mg Oral Q8H PRN Rise Patience, MD      . mesalamine (LIALDA) EC tablet 4.8 g  4.8 g Oral Q breakfast Rise Patience, MD   4.8 g at 06/23/17 0847  . metroNIDAZOLE (FLAGYL) IVPB 500 mg  500 mg Intravenous Q8H Rise Patience, MD   Stopped at 06/24/17 [phone]  . morphine 4 MG/ML injection 1 mg  1 mg Intravenous Q4H PRN Rise Patience, MD      . ondansetron [facility]) tablet 4 mg   4 mg Oral Q6H PRN Rise Patience, MD       Or  . ondansetron [facility]) injection 4 mg  4 mg Intravenous Q6H PRN Rise Patience, MD   4 mg at 06/23/17 0847  . oxyCODONE-acetaminophen (PERCOCET/ROXICET) 5-325 MG per tablet 1 tablet  1 tablet Oral Q4H PRN Rise Patience, MD   1 tablet at 06/24/17 0521  . pantoprazole (PROTONIX) EC tablet 40 mg  40 mg Oral Daily Rise Patience, MD   40 mg at 06/23/17 1115  . zolpidem (AMBIEN) tablet 5 mg  5 mg Oral QHS PRN  Rise Patience, MD        REVIEW OF SYSTEMS:    10 point review of systems done and was noted to be negative except as noted above.  PHYSICAL EXAMINATION: ECOG PERFORMANCE STATUS: 2 - Symptomatic, <50% confined to bed  Vitals:   05/25/17 1312  BP: (!) 163/70  Pulse: (!) 52  Resp: 18  Temp: 98.2 F (36.8 C)   Filed Weights   05/25/17 1312  Weight: 126 lb 3.2 oz (57.2 kg)  . Wt Readings from Last 3 Encounters:     05/25/17 126 lb 3.2 oz (57.2 kg)  04/27/17 122 lb (55.3 kg)   GENERAL:alert, NAD, appears well. SKIN: skin color, texture, turgor are normal, no rashes or significant lesions EYES: normal, conjunctiva are pink and non-injected, sclera clear OROPHARYNX:no exudate, no erythema and lips, buccal mucosa, and tongue normal  NECK: supple, thyroid normal size, non-tender, without nodularity LYMPH:  no palpable lymphadenopathy in the cervical, axillary or inguinal LUNGS: clear to auscultation and bilateral reduced breath sounds with normal respiratory effort  HEART: regular rate & rhythm and no murmurs and resolved lower extremity edema ABDOMEN:abdomen soft, non-tender and normal bowel sounds Musculoskeletal: No pedal edema. No calf pain or tenderness.  PSYCH: alert & oriented x 3 with fluent speech NEURO: no focal motor/sensory deficits.  LABORATORY DATA:  I have reviewed the data as listed . CBC Latest Ref Rng & Units 06/22/2017 05/25/2017  WBC 4.0 - 10.5 K/uL 11.7(H) 6.8  Hemoglobin 12.0 -  15.0 g/dL 11.7(L) 10.9(L)  Hematocrit 36.0 - 46.0 % 35.6(L) 34.5(L)  Platelets 150 - 400 K/uL 225 188   . CMP Latest Ref Rng & Units 05/25/2017  Glucose 65 - 99 mg/dL 75  BUN 6 - 20 mg/dL 14.3  Creatinine 0.44 - 1.00 mg/dL 0.8  Sodium 135 - 145 mmol/L 142  Potassium 3.5 - 5.1 mmol/L 4.0  Chloride 101 - 111 mmol/L -  CO2 22 - 32 mmol/L 26  Calcium 8.9 - 10.3 mg/dL 9.0  Total Protein 6.5 - 8.1 g/dL 7.3  Total Bilirubin 0.3 - 1.2 mg/dL 0.25  Alkaline Phos 38 - 126 U/L 86  AST 15 - 41 U/L 35(H)  ALT 14 - 54 U/L 35     RADIOGRAPHIC STUDIES:  Ct Chest W Contrast  Addendum Date: 05/29/2017   ADDENDUM REPORT: 05/29/2017 10:46 ADDENDUM: These results will be called to the ordering clinician or representative by the Radiologist Assistant, and communication documented in the PACS or zVision Dashboard. Electronically Signed   By: Richardean Sale M.D.   On: 05/29/2017 10:46   Result Date: 05/29/2017 CLINICAL DATA:  History of metastatic lung cancer diagnosed in 2016. Chemotherapy, and immunotherapy and radiation therapy completed. Cough for 1 month. Diffuse abdominal pain. EXAM: CT CHEST, ABDOMEN, AND PELVIS WITH CONTRAST TECHNIQUE: Multidetector CT imaging of the chest, abdomen and pelvis was performed following the standard protocol during bolus administration of intravenous contrast. CONTRAST:  19m ISOVUE-300 IOPAMIDOL (ISOVUE-300) INJECTION 61% COMPARISON:  CT 12/08/2016 and 10/03/2016. FINDINGS: CT CHEST FINDINGS Cardiovascular: No acute vascular findings are demonstrated. There is chronic attenuation of the left lower lobe pulmonary artery. Atherosclerosis of aorta, great vessels and coronary artery is noted. Right IJ Port-A-Cath extends to the superior cavoatrial junction. The heart size is normal. There is no pericardial effusion. Mediastinum/Nodes: There are no enlarged mediastinal, hilar or axillary lymph nodes. Small hiatal hernia. Lungs/Pleura: No significant residual pleural effusion or  discrete pleural based mass at the posterior left costophrenic angle.  There is mild pleural thickening in this area. Moderate emphysema and scattered pulmonary parenchymal scarring are stable. Stellate scarring in the superior segment of the left lower lobe (images 78 through 85 of series 4), stable. Stable 5 mm right lower lobe nodule on image 96. Musculoskeletal/Chest wall: Healing fractures of the left tenth and eleventh ribs posteriorly. No worrisome osseous findings. CT ABDOMEN AND PELVIS FINDINGS Hepatobiliary: The liver appears stable with multiple well-circumscribed low-density lesions, most consistent with cysts. No enhancing or enlarging hepatic lesions are identified. No evidence of gallstones, gallbladder wall thickening or biliary dilatation. Pancreas: Unremarkable. No pancreatic ductal dilatation or surrounding inflammatory changes. Spleen: Normal in size without focal abnormality. Adrenals/Urinary Tract: Both adrenal glands appear normal. The kidneys appear normal without evidence of urinary tract calculus, suspicious lesion or hydronephrosis. No bladder abnormalities are seen. Stomach/Bowel: The stomach and small bowel appear normal aside from an incidental duodenal diverticulum. There is mild cecal wall thickening without surrounding inflammation. The cecum is mobile. The appendix appears normal. There are diverticular changes within the proximal sigmoid colon with associated wall thickening and surrounding inflammation, suspicious for diverticulitis. No evidence of perforation, abscess or obstruction. Vascular/Lymphatic: There are no enlarged abdominal or pelvic lymph nodes. Diffuse aortic and branch vessel atherosclerosis. Reproductive: Hysterectomy.  No evidence of adnexal mass. Other: No evidence of abdominal wall mass or hernia. No ascites. Musculoskeletal: The mixed lytic and sclerotic lesion involving the left iliac bone and associated pathologic fracture extending into the sacroiliac joint  are similar to the previous study. No evidence of associated enlarging soft tissue mass. No new lytic lesions are seen. IMPRESSION: 1. New sigmoid colon wall thickening in surrounding inflammation consistent with diverticulitis. No evidence of abscess, perforation or obstruction. 2. No residual pleural based mass or significant pleural effusion in the left hemithorax. No evidence of thoracic metastatic disease. 3. No evidence of progressive metastatic disease within the abdomen or pelvis. Mixed lytic and blastic lesion involving the left iliac bone and associated pathologic fracture are unchanged. Left rib fractures demonstrate partial healing and appear benign. 4. Stable incidental findings including hepatic cysts and Aortic Atherosclerosis (ICD10-I70.0). Electronically Signed: By: Richardean Sale M.D. On: 05/29/2017 10:32   Ct Abdomen Pelvis W Contrast  Addendum Date: 05/29/2017   ADDENDUM REPORT: 05/29/2017 10:46 ADDENDUM: These results will be called to the ordering clinician or representative by the Radiologist Assistant, and communication documented in the PACS or zVision Dashboard. Electronically Signed   By: Richardean Sale M.D.   On: 05/29/2017 10:46   Result Date: 05/29/2017 CLINICAL DATA:  History of metastatic lung cancer diagnosed in 2016. Chemotherapy, and immunotherapy and radiation therapy completed. Cough for 1 month. Diffuse abdominal pain. EXAM: CT CHEST, ABDOMEN, AND PELVIS WITH CONTRAST TECHNIQUE: Multidetector CT imaging of the chest, abdomen and pelvis was performed following the standard protocol during bolus administration of intravenous contrast. CONTRAST:  171m ISOVUE-300 IOPAMIDOL (ISOVUE-300) INJECTION 61% COMPARISON:  CT 12/08/2016 and 10/03/2016. FINDINGS: CT CHEST FINDINGS Cardiovascular: No acute vascular findings are demonstrated. There is chronic attenuation of the left lower lobe pulmonary artery. Atherosclerosis of aorta, great vessels and coronary artery is noted. Right  IJ Port-A-Cath extends to the superior cavoatrial junction. The heart size is normal. There is no pericardial effusion. Mediastinum/Nodes: There are no enlarged mediastinal, hilar or axillary lymph nodes. Small hiatal hernia. Lungs/Pleura: No significant residual pleural effusion or discrete pleural based mass at the posterior left costophrenic angle. There is mild pleural thickening in this area. Moderate emphysema and scattered pulmonary  parenchymal scarring are stable. Stellate scarring in the superior segment of the left lower lobe (images 78 through 85 of series 4), stable. Stable 5 mm right lower lobe nodule on image 96. Musculoskeletal/Chest wall: Healing fractures of the left tenth and eleventh ribs posteriorly. No worrisome osseous findings. CT ABDOMEN AND PELVIS FINDINGS Hepatobiliary: The liver appears stable with multiple well-circumscribed low-density lesions, most consistent with cysts. No enhancing or enlarging hepatic lesions are identified. No evidence of gallstones, gallbladder wall thickening or biliary dilatation. Pancreas: Unremarkable. No pancreatic ductal dilatation or surrounding inflammatory changes. Spleen: Normal in size without focal abnormality. Adrenals/Urinary Tract: Both adrenal glands appear normal. The kidneys appear normal without evidence of urinary tract calculus, suspicious lesion or hydronephrosis. No bladder abnormalities are seen. Stomach/Bowel: The stomach and small bowel appear normal aside from an incidental duodenal diverticulum. There is mild cecal wall thickening without surrounding inflammation. The cecum is mobile. The appendix appears normal. There are diverticular changes within the proximal sigmoid colon with associated wall thickening and surrounding inflammation, suspicious for diverticulitis. No evidence of perforation, abscess or obstruction. Vascular/Lymphatic: There are no enlarged abdominal or pelvic lymph nodes. Diffuse aortic and branch vessel  atherosclerosis. Reproductive: Hysterectomy.  No evidence of adnexal mass. Other: No evidence of abdominal wall mass or hernia. No ascites. Musculoskeletal: The mixed lytic and sclerotic lesion involving the left iliac bone and associated pathologic fracture extending into the sacroiliac joint are similar to the previous study. No evidence of associated enlarging soft tissue mass. No new lytic lesions are seen. IMPRESSION: 1. New sigmoid colon wall thickening in surrounding inflammation consistent with diverticulitis. No evidence of abscess, perforation or obstruction. 2. No residual pleural based mass or significant pleural effusion in the left hemithorax. No evidence of thoracic metastatic disease. 3. No evidence of progressive metastatic disease within the abdomen or pelvis. Mixed lytic and blastic lesion involving the left iliac bone and associated pathologic fracture are unchanged. Left rib fractures demonstrate partial healing and appear benign. 4. Stable incidental findings including hepatic cysts and Aortic Atherosclerosis (ICD10-I70.0). Electronically Signed: By: Richardean Sale M.D. On: 05/29/2017 10:32    ASSESSMENT & PLAN:   #1 Metastatic poorly differentiated carcinoma with likely lung primary [non-small cell lung cancer].  CT of the head with and without contrast showed no evidence of metastatic disease. Patient notes much improved pain control. EGFR blood test mutation analysis negative. Patient's pain is much better controlled after radiation for the painful ilium met. CT chest abdomen pelvis 04/19/2016 shows no evidence of disease progression. Patient tolerated Nivolumab very well but was discontinued when she developed grade 2 Immune colitis . Has been off Nivolumab for >6 months  CT chest abdomen pelvis on 06/24/2016 shows no evidence of new disease or progression of metastatic disease. CT chest abdomen pelvis 09/06/2016 shows 1. Mixed interval response to therapy. 2. There is a new  left ventral chest wall lesion deep to the pectoralis musculature worrisome for metastatic disease. 3. Posterior lower lobe nodular densities are identified which may reflect areas of pulmonary metastasis. 4. Interval decrease in size of destructive lesion involving the left iliac bone.  CT chest abd pelvis 12/08/2016: Cystic mass involving the left ventral chest wall has resolved in the interval. Likely was a hematoma due to trauma. Interval increase in size of pleural base mass overlying the posterior and inferior left lower lobe. There is also a new left pleural effusion identified.  CT chest 02/01/2017: Residual irregular soft tissue thickening/volume loss and trace left pleural fluid  at the base of the left hemithorax, overall improved in appearance from 12/08/2016. No measurable lesion.  CT chest 05/29/2017 shows -No residual pleural based mass or significant pleural effusion in the left hemithorax. No evidence of thoracic metastatic disease. No evidence of progressive metastatic disease within the abdomen or pelvis. Mixed lytic and blastic lesion involving the left iliac bone and associated pathologic fracture are unchanged.   PLAN -No symptoms suggestive of disease progression. -She is clinically doing better eating better and has gained about 4 pounds -no indication for additional treatment for the patients metastatic lung cancer at this time. -We shall continue her Xgeva every 4 weeks  -RTC with Dr Irene Limbo in 4 weeks with labs  #2 diarrhea-  now resolved was previously. grade 2 likely related to immune colitis from her Nivolumaband also had c diff colitis (s/p vancomycin) and possible underlying IBD She is currently on Lialda, budesonide,probiotics and lomotil (not using her lomotil much) #3 Concern for possible diverticulitis on CT Plan  -patients budesonide weaned off. -Continue sandostatin q4weeks -given findings of possible diverticulitis of the patient was recommended bowel rest with  liquid diet for 3 days and gradually progressing diet over the next week. -She was treated with Cipro plus Flagyl for 1 week. -She was recommended to seek immediate attention for worsening abdominal pain or diarrhea.  #4 DVT and PE  -will need to be on ongoing Lovenox therapy -Monitor for bleeding .  #5 Barrett's esophagus 4cms in the distal esophagus with low and high-grade dysplasia cannot rule out an early intra-mucosal esophageal adenocarcinoma. Plan -Patient being monitored by Dr. [name] from GI. -patient notes that she does not want to f/u at [facility] for further intervention as per GI plan.  -Continue Vertell Novak and Sandostatin. -RTC with Dr Irene Limbo in 4 weeks with labs  All questions were answered. The patient knows to call the clinic with any problems, questions or concerns.  Sullivan Lone MD [place] [facility]/Oncology Physician [facility]  (Office): [phone] (Work cell): [phone] (Fax): [phone]

## 2017-06-24 NOTE — Op Note (Signed)
[facility] Patient Name: Cindy Byrd Procedure Date: 06/24/2017 MRN: [id number] Attending MD: Carlota Raspberry. Frenchie Dangerfield MD, MD Date of Birth: [birth year] CSN: [id number] Age: 76 Admit Type: Inpatient Procedure:                Flexible Sigmoidoscopy Indications:              Abnormal CT of the GI tract - left sided colitis,                            history of chemotherapy induced colitis, history of                            C diff. Providers:                Carlota Raspberry. Faiz Weber MD, MD, Laverta Baltimore RN,                            RN, Cletis Athens, Technician Referring MD:              Medicines:                None Complications:            No immediate complications. Estimated blood loss:                            Minimal. Estimated Blood Loss:     Estimated blood loss was minimal. Procedure:                Pre-Anesthesia Assessment:                           - Prior to the procedure, a History and Physical                            was performed, and patient medications and                            allergies were reviewed. The patient's tolerance of                            previous anesthesia was also reviewed. The risks                            and benefits of the procedure and the sedation                            options and risks were discussed with the patient.                            All questions were answered, and informed consent                            was obtained. Prior Anticoagulants: The patient has  taken Lovenox (enoxaparin), last dose was 1 day                            prior to procedure. ASA Grade Assessment: III - A                            patient with severe systemic disease. After                            reviewing the risks and benefits, the patient was                            deemed in satisfactory condition to undergo the                            procedure.  After obtaining informed consent, the scope was                            passed under direct vision. The EC-[id number] ([id number])                            scope was introduced through the anus and advanced                            to the the descending colon. The flexible                            sigmoidoscopy was accomplished without difficulty.                            The patient tolerated the procedure well. The                            quality of the bowel preparation was good. Scope In: Scope Out: Findings:      The perianal and digital rectal examinations were normal.      A patchy areas of white plaque covered mucosa was found in the sigmoid       colon and in the descending colon. Biopsies were taken with a cold       forceps for histology. No active inflammatory changes of the colon       appreciated otherwise. Significant edema encountered in the distal       descending colon, at which point more proximal evaluation was not       performed.      The exam was otherwise without abnormality. Impression:               - White plaque covered mucosa in the sigmoid colon                            and in the descending colon. Biopsied.                           - The examination was otherwise normal.  Overall, findings are suspicious for C diff /                            infectious, unlikely IBD or ischemic Moderate Sedation:      no moderate sedation Recommendation:           - Return patient to hospital ward for ongoing care.                           - Resume previous diet.                           - Continue present medications.                           - Okay to resume lovenox                           - Stop flagyl, start oral vancomycin 125mg  four                            times daily for 10 days                           - Continue Lialda                           - Await stool studies and pathology results                           -  Okay to discharge home given her improvement and                            reduction in pain. We will follow her up as                            outpatient Procedure Code(s):        --- Professional ---                           [phone], Sigmoidoscopy, flexible; with biopsy, single                            or multiple Diagnosis Code(s):        --- Professional ---                           K63.89, Other specified diseases of intestine                           R93.3, Abnormal findings on diagnostic imaging of                            other parts of digestive tract CPT copyright 2016 [employer]. All rights reserved. The codes documented in this report are preliminary and upon coder review may  be revised to meet current compliance  requirements. Remo Lipps P. Giacomo Valone MD, MD 06/24/2017 1:04:12 PM This report has been signed electronically. Number of Addenda: 0

## 2017-06-24 NOTE — Progress Notes (Signed)
[name]   HEMATOLOGY/ONCOLOGY INPATIENT PROGRESS NOTE  Date of Service: 06/23/2017  Inpatient Attending: .Elmarie Shiley, MD   SUBJECTIVE  I was consulted by Elmarie Shiley, MD Re: Cindy Byrd who is one of my patients from clinic who has been admitted for left lower quadrant abdominal discomfort with CT evidence of descending colon and rectosigmoid colitis. She has history of metastatic lung cancer which has been stable of all treatments for a fair bit of time. 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 There was also concern for baseline IBD . Patient required several medications to control her previous diarrhea including oral steroids , cholestyramine , monthly Sandostatin , mesalamine Lomotil . She also has history of C. difficile in the past . I discussed with the hospitalist recommendations for stool studies including C. difficile and GI panel if patient is having liquid stools .patient is on empiric Flagyl which is quite reasonable . Would recommend consideration of flexible sigmoidoscopy for tissue diagnosis to determine need for antibiotics versus management of immune colitis since this might imply restarting the patient on budesonide . IV fluids pain management and supportive cares as per hospitalist. These recommendations were discussed with the patient as well.  OBJECTIVE:  NAD  PHYSICAL EXAMINATION: . Vitals:   06/23/17 1300 06/23/17 1425 06/23/17 2037 06/24/17 0506  BP: 101/62 (!) 129/56 (!) 123/42 101/80  Pulse:  81 72 76  Resp:   20 18  Temp:  98 F (36.7 C) 98.6 F (37 C) 98.5 F (36.9 C)  TempSrc:  Oral Oral Oral  SpO2:  98% 95% 96%  Weight:      Height:       Filed Weights   06/22/17 2048 06/23/17 0902  Weight: 126 lb (57.2 kg) 132 lb 0.9 oz (59.9 kg)   .Body mass index is 20.08 kg/m.  GENERAL:alert, in no acute distress and  comfortable SKIN: skin color, texture, turgor are normal, no rashes or significant lesions EYES: normal, conjunctiva are pink and non-injected, sclera clear OROPHARYNX:no exudate, no erythema and lips, buccal mucosa, and tongue normal  NECK: supple, no JVD, thyroid normal size, non-tender, without nodularity LYMPH:  no palpable lymphadenopathy in the cervical, axillary or inguinal LUNGS: clear to auscultation with normal respiratory effort HEART: regular rate & rhythm,  no murmurs and no lower extremity edema ABDOMEN: mild TTP LLQ, no guarding rigidity or rebound .  Musculoskeletal: no cyanosis of digits and no clubbing  PSYCH: alert & oriented x 3 with fluent speech NEURO: no focal motor/sensory deficits  MEDICAL HISTORY:  Past Medical History:  Diagnosis Date  . Barrett's esophagus   . Bone neoplasm 06/24/2015  . Cancer [facility])    metastatic poorly differentiated carcinoma. tumor left groin surgical removal with radiation tx.  . Cataract    BILATERAL  . Cigarette smoker two packs a day or less    Currently still smoking 2 PPD - Not interested in quitting at this time.  . Colitis 2017  . Colon polyps    hyperplastic, tubular adenomas, tubulovillous adenoma  . Cough, persistent    hx. lung cancer ? primary-being evaluated, unsure of primary site.  . Depression 06/24/2015  . Diverticulosis   . Emphysema of lung ([place])   . Endometriosis    Hysterectomy with BSO at age 70 yrs  . Esophageal adenocarcinoma ([place]) 08/11/15   intramucosal  . Gastritis   . GERD (  gastroesophageal reflux disease)   . H/O: pneumonia   . Hiatal hernia   . Hyperlipidemia   . Hypertension 06/24/2015   likely improved incidental to 40 lbs weight loss from her neoplasm. No Longer taking med for this as of 08-06-15  . IBS (irritable bowel syndrome)   . Pain    left hip-persistent"tumor of bone"-radiation tx. 10.  . Vitamin D deficiency disease     SURGICAL HISTORY: Past Surgical History:  Procedure Laterality  Date  . BARTHOLIN GLAND CYST EXCISION  76 yo ago   Does not want if it was an infected cyst or tumor. Was soon as delivery  . COLONOSCOPY W/ POLYPECTOMY     multiple times - last done 09/2014 per patient.  . ESOPHAGOGASTRODUODENOSCOPY (EGD) WITH PROPOFOL N/A 08/11/2015   Procedure: ESOPHAGOGASTRODUODENOSCOPY (EGD) WITH PROPOFOL;  Surgeon: [name], MD;  Location: [facility];  Service: Gastroenterology;  Laterality: N/A;  . GANGLION CYST EXCISION    . KNEE ARTHROSCOPY  age about 1 yrs  . TONSILLECTOMY    . TOTAL ABDOMINAL HYSTERECTOMY W/ BILATERAL SALPINGOOPHORECTOMY  at age 35 yrs   For endometriosis    SOCIAL HISTORY: Social History   Social History  . Marital status: Widowed    Spouse name: N/A  . Number of children: 2  . Years of education: N/A   Occupational History  . Not on file.   Social History Main Topics  . Smoking status: Former Smoker    Packs/day: 1.00    Years: 60.00    Types: Cigarettes    Quit date: 12/06/2015  . Smokeless tobacco: Never Used  . Alcohol use No  . Drug use: No  . Sexual activity: No   Other Topics Concern  . Not on file   Social History Narrative  . No narrative on file    FAMILY HISTORY: Family History  Problem Relation Age of Onset  . Colon cancer Brother   . Colon cancer Brother   . Stroke Mother   . Colon cancer Father   . Breast cancer Daughter 79       ER/PR+ stage II    ALLERGIES:  is allergic to penicillins; remeron [mirtazapine]; and latex.  MEDICATIONS:  Scheduled Meds: . amitriptyline  25 mg Oral QHS  . calcium-vitamin D  2 tablet Oral TID  . citalopram  20 mg Oral Daily  . enoxaparin (LOVENOX) injection  1.5 mg/kg Subcutaneous Q24H  . feeding supplement (ENSURE ENLIVE)  237 mL Oral BID BM  . mesalamine  4.8 g Oral Q breakfast  . pantoprazole  40 mg Oral Daily   Continuous Infusions: . metronidazole Stopped (06/24/17 0557)   PRN Meds:.acetaminophen **OR** acetaminophen, LORazepam, morphine injection,  ondansetron **OR** ondansetron (ZOFRAN) IV, oxyCODONE-acetaminophen, zolpidem  REVIEW OF SYSTEMS:    10 Point review of Systems was done is negative except as noted above.   LABORATORY DATA:  I have reviewed the data as listed  . CBC Latest Ref Rng & Units 06/23/2017 06/22/2017 05/25/2017  WBC 4.0 - 10.5 K/uL 11.8(H) 11.7(H) 6.8  Hemoglobin 12.0 - 15.0 g/dL 12.0 11.7(L) 10.9(L)  Hematocrit 36.0 - 46.0 % 36.5 35.6(L) 34.5(L)  Platelets 150 - 400 K/uL 228 225 188    . CMP Latest Ref Rng & Units 06/24/2017 06/22/2017 05/25/2017  Glucose 65 - 99 mg/dL 115(H) 138(H) 75  BUN 6 - 20 mg/dL 16 16 14.3  Creatinine 0.44 - 1.00 mg/dL 1.00 0.88 0.8  Sodium 135 - 145 mmol/L 137 131(L) 142  Potassium 3.5 - 5.1 mmol/L 4.3 4.3 4.0  Chloride 101 - 111 mmol/L 104 98(L) -  CO2 22 - 32 mmol/L 25 23 26   Calcium 8.9 - 10.3 mg/dL 7.5(L) 8.1(L) 9.0  Total Protein 6.5 - 8.1 g/dL 6.2(L) 7.7 7.3  Total Bilirubin 0.3 - 1.2 mg/dL 0.3 0.8 0.25  Alkaline Phos 38 - 126 U/L 65 88 86  AST 15 - 41 U/L 15 24 35(H)  ALT 14 - 54 U/L 15 21 35     RADIOGRAPHIC STUDIES: I have personally reviewed the radiological images as listed and agreed with the findings in the report. Dg Chest 2 View  Result Date: 06/23/2017 CLINICAL DATA:  Fatigue and multiple falls at home. EXAM: CHEST  2 VIEW COMPARISON:  CT from 05/29/2017, CXR 11/03/2016 FINDINGS: The heart size and mediastinal contours are within normal limits. Aortic atherosclerosis at the arch without aneurysm is again seen. There is subsegmental atelectasis and/or scarring noted in in the left lower lobe and lingula. Mild hilar prominence on the right is believed to be a vascular summation based on recent CT. The visualized skeletal structures are unremarkable. Port catheter tip terminates in the distal SVC. IMPRESSION: No active cardiopulmonary disease. Lingular and left basilar atelectasis and/or scarring. Electronically Signed   By: Ashley Royalty M.D.   On: 06/23/2017 00:21     Ct Head Wo Contrast  Result Date: 06/23/2017 CLINICAL DATA:  76 year old female with weakness. EXAM: CT HEAD WITHOUT CONTRAST TECHNIQUE: Contiguous axial images were obtained from the base of the skull through the vertex without intravenous contrast. COMPARISON:  Head CT dated 09/07/2016 FINDINGS: Brain: There is stable cortical atrophy. Minimal periventricular and deep white matter chronic microvascular ischemic changes noted. There is no acute intracranial hemorrhage. No mass effect or midline shift. No extra-axial fluid collection. Vascular: No hyperdense vessel or unexpected calcification. Skull: Normal. Negative for fracture or focal lesion. Sinuses/Orbits: There is opacification of the majority of the left sphenoid sinus. The remainder of the visualized paranasal sinuses and mastoid air cells are clear. Other: None IMPRESSION: No acute intracranial pathology.  Stable cortical atrophy. Electronically Signed   By: Anner Crete M.D.   On: 06/23/2017 01:36   Ct Chest W Contrast  Addendum Date: 05/29/2017   ADDENDUM REPORT: 05/29/2017 10:46 ADDENDUM: These results will be called to the ordering clinician or representative by the Radiologist Assistant, and communication documented in the PACS or zVision Dashboard. Electronically Signed   By: Richardean Sale M.D.   On: 05/29/2017 10:46   Result Date: 05/29/2017 CLINICAL DATA:  History of metastatic lung cancer diagnosed in 2016. Chemotherapy, and immunotherapy and radiation therapy completed. Cough for 1 month. Diffuse abdominal pain. EXAM: CT CHEST, ABDOMEN, AND PELVIS WITH CONTRAST TECHNIQUE: Multidetector CT imaging of the chest, abdomen and pelvis was performed following the standard protocol during bolus administration of intravenous contrast. CONTRAST:  157m ISOVUE-300 IOPAMIDOL (ISOVUE-300) INJECTION 61% COMPARISON:  CT 12/08/2016 and 10/03/2016. FINDINGS: CT CHEST FINDINGS Cardiovascular: No acute vascular findings are demonstrated. There is  chronic attenuation of the left lower lobe pulmonary artery. Atherosclerosis of aorta, great vessels and coronary artery is noted. Right IJ Port-A-Cath extends to the superior cavoatrial junction. The heart size is normal. There is no pericardial effusion. Mediastinum/Nodes: There are no enlarged mediastinal, hilar or axillary lymph nodes. Small hiatal hernia. Lungs/Pleura: No significant residual pleural effusion or discrete pleural based mass at the posterior left costophrenic angle. There is mild pleural thickening in this area. Moderate emphysema and scattered pulmonary  parenchymal scarring are stable. Stellate scarring in the superior segment of the left lower lobe (images 78 through 85 of series 4), stable. Stable 5 mm right lower lobe nodule on image 96. Musculoskeletal/Chest wall: Healing fractures of the left tenth and eleventh ribs posteriorly. No worrisome osseous findings. CT ABDOMEN AND PELVIS FINDINGS Hepatobiliary: The liver appears stable with multiple well-circumscribed low-density lesions, most consistent with cysts. No enhancing or enlarging hepatic lesions are identified. No evidence of gallstones, gallbladder wall thickening or biliary dilatation. Pancreas: Unremarkable. No pancreatic ductal dilatation or surrounding inflammatory changes. Spleen: Normal in size without focal abnormality. Adrenals/Urinary Tract: Both adrenal glands appear normal. The kidneys appear normal without evidence of urinary tract calculus, suspicious lesion or hydronephrosis. No bladder abnormalities are seen. Stomach/Bowel: The stomach and small bowel appear normal aside from an incidental duodenal diverticulum. There is mild cecal wall thickening without surrounding inflammation. The cecum is mobile. The appendix appears normal. There are diverticular changes within the proximal sigmoid colon with associated wall thickening and surrounding inflammation, suspicious for diverticulitis. No evidence of perforation, abscess  or obstruction. Vascular/Lymphatic: There are no enlarged abdominal or pelvic lymph nodes. Diffuse aortic and branch vessel atherosclerosis. Reproductive: Hysterectomy.  No evidence of adnexal mass. Other: No evidence of abdominal wall mass or hernia. No ascites. Musculoskeletal: The mixed lytic and sclerotic lesion involving the left iliac bone and associated pathologic fracture extending into the sacroiliac joint are similar to the previous study. No evidence of associated enlarging soft tissue mass. No new lytic lesions are seen. IMPRESSION: 1. New sigmoid colon wall thickening in surrounding inflammation consistent with diverticulitis. No evidence of abscess, perforation or obstruction. 2. No residual pleural based mass or significant pleural effusion in the left hemithorax. No evidence of thoracic metastatic disease. 3. No evidence of progressive metastatic disease within the abdomen or pelvis. Mixed lytic and blastic lesion involving the left iliac bone and associated pathologic fracture are unchanged. Left rib fractures demonstrate partial healing and appear benign. 4. Stable incidental findings including hepatic cysts and Aortic Atherosclerosis (ICD10-I70.0). Electronically Signed: By: Richardean Sale M.D. On: 05/29/2017 10:32   Ct Abdomen Pelvis W Contrast  Result Date: 06/23/2017 CLINICAL DATA:  Left lower quadrant pain x2 days with leukocytosis. EXAM: CT ABDOMEN AND PELVIS WITH CONTRAST TECHNIQUE: Multidetector CT imaging of the abdomen and pelvis was performed using the standard protocol following bolus administration of intravenous contrast. CONTRAST:  100 cc Isovue-300 COMPARISON:  05/29/2017 FINDINGS: Lower chest: The included heart is normal in size without pericardial effusion. There is bibasilar atelectasis and/or scarring. No effusion or pneumothorax. Small hiatal hernia. Hepatobiliary: Physiologic distention of the gallbladder without stones. Scattered stable water attenuating cysts and/or  tiny hemangiomata of the liver, the largest are adjacent to the gallbladder fossa measuring approximately 10 and 11 mm diameter. Some are too small to further characterize however. There is no biliary dilatation. Pancreas: Atrophic pancreas without inflammation. Spleen: Normal Adrenals/Urinary Tract: Normal bilateral adrenal glands. No enhancing renal mass or obstructive uropathy. No nephrolithiasis. Stomach/Bowel: There is diffuse transmural thickening of the descending colon through rectosigmoid consistent with colitis. No bowel obstruction is seen. A moderate amount of fecal retention is seen within the ascending and transverse colon. There mild fluid-filled distention of small bowel is noted without obstruction. Stomach is contracted. The appendix is not visualized. No pericecal inflammation is noted however. Vascular/Lymphatic: Aortoiliac atherosclerosis. No pathologically enlarged lymph nodes by CT size criteria. Reproductive: Hysterectomy.  No adnexal mass. Other: No free air nor free fluid.  No hernia. 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 Chronic posterior left lower rib fractures. IMPRESSION: 1. Colitis of the descending through rectosigmoid colon. 2. Mixed lytic and blastic lesion of the left iliac bone with pathologic fracture, bony expansion and thickening of the cortex is noted as before. Chronic left posterior rib fractures with healing. 3. Incidental findings of hepatic cysts and/or hemangiomata and aortoiliac atherosclerosis. Electronically Signed   By: Ashley Royalty M.D.   On: 06/23/2017 01:51   Ct Abdomen Pelvis W Contrast  Addendum Date: 05/29/2017   ADDENDUM REPORT: 05/29/2017 10:46 ADDENDUM: These results will be called to the ordering clinician or representative by the Radiologist Assistant,  and communication documented in the PACS or zVision Dashboard. Electronically Signed   By: Richardean Sale M.D.   On: 05/29/2017 10:46   Result Date: 05/29/2017 CLINICAL DATA:  History of metastatic lung cancer diagnosed in 2016. Chemotherapy, and immunotherapy and radiation therapy completed. Cough for 1 month. Diffuse abdominal pain. EXAM: CT CHEST, ABDOMEN, AND PELVIS WITH CONTRAST TECHNIQUE: Multidetector CT imaging of the chest, abdomen and pelvis was performed following the standard protocol during bolus administration of intravenous contrast. CONTRAST:  180m ISOVUE-300 IOPAMIDOL (ISOVUE-300) INJECTION 61% COMPARISON:  CT 12/08/2016 and 10/03/2016. FINDINGS: CT CHEST FINDINGS Cardiovascular: No acute vascular findings are demonstrated. There is chronic attenuation of the left lower lobe pulmonary artery. Atherosclerosis of aorta, great vessels and coronary artery is noted. Right IJ Port-A-Cath extends to the superior cavoatrial junction. The heart size is normal. There is no pericardial effusion. Mediastinum/Nodes: There are no enlarged mediastinal, hilar or axillary lymph nodes. Small hiatal hernia. Lungs/Pleura: No significant residual pleural effusion or discrete pleural based mass at the posterior left costophrenic angle. There is mild pleural thickening in this area. Moderate emphysema and scattered pulmonary parenchymal scarring are stable. Stellate scarring in the superior segment of the left lower lobe (images 78 through 85 of series 4), stable. Stable 5 mm right lower lobe nodule on image 96. Musculoskeletal/Chest wall: Healing fractures of the left tenth and eleventh ribs posteriorly. No worrisome osseous findings. CT ABDOMEN AND PELVIS FINDINGS Hepatobiliary: The liver appears stable with multiple well-circumscribed low-density lesions, most consistent with cysts. No enhancing or enlarging hepatic lesions are identified. No evidence of gallstones, gallbladder wall thickening or biliary  dilatation. Pancreas: Unremarkable. No pancreatic ductal dilatation or surrounding inflammatory changes. Spleen: Normal in size without focal abnormality. Adrenals/Urinary Tract: Both adrenal glands appear normal. The kidneys appear normal without evidence of urinary tract calculus, suspicious lesion or hydronephrosis. No bladder abnormalities are seen. Stomach/Bowel: The stomach and small bowel appear normal aside from an incidental duodenal diverticulum. There is mild cecal wall thickening without surrounding inflammation. The cecum is mobile. The appendix appears normal. There are diverticular changes within the proximal sigmoid colon with associated wall thickening and surrounding inflammation, suspicious for diverticulitis. No evidence of perforation, abscess or obstruction. Vascular/Lymphatic: There are no enlarged abdominal or pelvic lymph nodes. Diffuse aortic and branch vessel atherosclerosis. Reproductive: Hysterectomy.  No evidence of adnexal mass. Other: No evidence of abdominal wall mass or hernia. No ascites. Musculoskeletal: The mixed lytic and sclerotic lesion involving the left iliac bone and associated pathologic fracture extending into the sacroiliac joint are similar to the previous study. No evidence of associated enlarging soft tissue mass. No new lytic lesions are seen. IMPRESSION: 1. New sigmoid colon wall thickening  in surrounding inflammation consistent with diverticulitis. No evidence of abscess, perforation or obstruction. 2. No residual pleural based mass or significant pleural effusion in the left hemithorax. No evidence of thoracic metastatic disease. 3. No evidence of progressive metastatic disease within the abdomen or pelvis. Mixed lytic and blastic lesion involving the left iliac bone and associated pathologic fracture are unchanged. Left rib fractures demonstrate partial healing and appear benign. 4. Stable incidental findings including hepatic cysts and Aortic Atherosclerosis  (ICD10-I70.0). Electronically Signed: By: Richardean Sale M.D. On: 05/29/2017 10:32    ASSESSMENT & PLAN:   #1 Metastatic poorly differentiated carcinoma with likely lung primary [non-small cell lung cancer].  CT of the head with and without contrast showed no evidence of metastatic disease. Patient notes much improved pain control. EGFR blood test mutation analysis negative. Patient's pain is much better controlled after radiation for the painful ilium met. CT chest abdomen pelvis 04/19/2016 shows no evidence of disease progression. Patient tolerated Nivolumab very well but was discontinued when she developed grade 2 Immune colitis . Has been off Nivolumab for >6 months  CT chest abdomen pelvis on 06/24/2016 shows no evidence of new disease or progression of metastatic disease. CT chest abdomen pelvis 09/06/2016 shows 1. Mixed interval response to therapy. 2. There is a new left ventral chest wall lesion deep to the pectoralis musculature worrisome for metastatic disease. 3. Posterior lower lobe nodular densities are identified which may reflect areas of pulmonary metastasis. 4. Interval decrease in size of destructive lesion involving the left iliac bone.  CT chest abd pelvis 12/08/2016: Cystic mass involving the left ventral chest wall has resolved in the interval. Likely was a hematoma due to trauma. Interval increase in size of pleural base mass overlying the posterior and inferior left lower lobe. There is also a new left pleural effusion identified.  CT chest 02/01/2017: Residual irregular soft tissue thickening/volume loss and trace left pleural fluid at the base of the left hemithorax, overall improved in appearance from 12/08/2016. No measurable lesion.  CT chest 05/29/2017 shows -No residual pleural based mass or significant pleural effusion in the left hemithorax. No evidence of thoracic metastatic disease. No evidence of progressive metastatic disease within the abdomen or pelvis.  Mixed lytic and blastic lesion involving the left iliac bone and associated pathologic fracture are unchanged.   PLAN -No symptoms suggestive of disease progression. -no indication for additional treatment for the patients metastatic lung cancer at this time. -We shall continue her Xgeva every 4 weeks   #2 diarrhea-  now resolved was previously. grade 2 likely related to immune colitis from her Nivolumab and also had c diff colitis (s/p vancomycin) and possible underlying IBD She is currently on Lialda, budesonide,probiotics and lomotil (not using her lomotil much) #3 Concern for possible diverticulitis on CT Now admitted with increased LLQ abdominal discomfort with soft stool (not watery). Plan  -stool studies for C diff and GI stool pathogen panel. -Liquid diet/soft foods as per hospitalist. -On empiric IV Flagyl plus ceftriaxone per hospitalist. -GI consultation for flexible sigmoidoscopy for definitive diagnosis ?immune colitis vs IBD vs ischemic colitis vs infectious colitis -further mx per above results.  #4 DVT and PE  -will need to be on ongoing Lovenox therapy -Monitor for bleeding .  #5 Barrett's esophagus 4cms in the distal esophagus with low and high-grade dysplasia cannot rule out an early intra-mucosal esophageal adenocarcinoma. Plan -Patient being monitored by Dr. [name] from GI. -patient notes that she does not want to f/u at  [facility] for further intervention as per GI plan.   I spent 30 minutes counseling the patient face to face. The total time spent in the appointment was 40 minutes and more than 50% was on counseling and direct patient cares.    Sullivan Lone MD [place] AAHIVMS [facility] Hematology/Oncology Physician [facility], [place]  (Office):       [phone] (Work cell):  [phone] (Fax):           [phone]

## 2017-06-24 NOTE — Discharge Summary (Signed)
Physician Discharge Summary  Cindy Byrd AJO:[id number] DOB: May 04, 1941 DOA: 06/22/2017  PCP: Tamsen Roers, MD  Admit date: 06/22/2017 Discharge date: 06/24/2017  Admitted From: Home  Disposition:  Home   Recommendations for Outpatient Follow-up:  1. Follow up with PCP in 1-2 weeks 2. Please obtain BMP/CBC in one week 3. Please follow up on the following pending results: follow GI pathogen and biopsy results     Discharge Condition: stable.  CODE STATUS: DNR Diet recommendation: Heart Healthy  Brief/Interim Summary: Cindy Byrd a 76 y.o.femalewith history of metastatic non-small cell lung cancer under observation, pulmonary embolism on Lovenox, Nivolumabinduced colitis presents to the ER with complaints of left lower quadrant pain. Patient states patient has been having persistent left lower quadrant pain over the last 3 days.  CT abdomen and pelvis shows left sided iliac fracture and colitis and rib fractures.  Assessment & Plan:   Principal Problem:   Left lower quadrant pain Active Problems:   Bilateral pulmonary embolism (HCC)   Colitis   Fracture of left iliac crest (HCC)   Malignant neoplasm of lung (HCC)  1-Colitis; C diff.  Presents with abdominal pain. Report 3 BM per day.  Will continue with flagyl. Hold on ceftriaxone due to history of C diff.  Check GI pathogen pending She has been off Budesonide.  Plan for sigmoidoscopy today. Appreciate Dr Havery Moros help  underwent endoscopy and finding consistent with C diff, plan to start oral vancomycin for 10 days. biospy results to be follow by GI. Ok to discharge home per GI.   2-Iliac Bone lytic lesion. Discussed with Dr Irene Limbo these are old finding.   3-Hyponatremia; Continue with IV fluids.   4-History of pulmonary embolism - on Lovenox. Ok to be resume per GI    Discharge Diagnoses:  Principal Problem:   Left lower quadrant pain Active Problems:   Bilateral pulmonary embolism (HCC)    Colitis   Fracture of left iliac crest ([place])   Malignant neoplasm of lung [facility])    Discharge Instructions  Discharge Instructions    Diet - low sodium heart healthy    Complete by:  As directed    Increase activity slowly    Complete by:  As directed      Allergies as of 06/24/2017      Reactions   Penicillins Other (See Comments)   Unknown; childhood allergy   Remeron [mirtazapine] Other (See Comments)   nightmares   Latex Rash      Medication List    STOP taking these medications   budesonide 3 MG 24 hr capsule Commonly known as:  ENTOCORT EC   ciprofloxacin 500 MG tablet Commonly known as:  CIPRO   diphenoxylate-atropine 2.5-0.025 MG tablet Commonly known as:  LOMOTIL   ibuprofen 200 MG tablet Commonly known as:  ADVIL,MOTRIN   liver oil-zinc oxide 40 % ointment Commonly known as:  DESITIN   metroNIDAZOLE 500 MG tablet Commonly known as:  FLAGYL     TAKE these medications   amitriptyline 25 MG tablet Commonly known as:  ELAVIL Take 25 mg by mouth at bedtime.   calcium-vitamin D 500-200 MG-UNIT tablet Commonly known as:  OSCAL WITH D Take 2 tablets by mouth 3 (three) times daily.   citalopram 20 MG tablet Commonly known as:  CELEXA Take 1 tablet (20 mg total) by mouth daily.   enoxaparin 80 MG/0.8ML injection Commonly known as:  LOVENOX Inject 0.8 mLs (80 mg total) into the skin daily.   lidocaine-prilocaine  cream Commonly known as:  EMLA Apply small amount over port 1-2 hours prior to treatment, cover with plastic wrap (DO NOT RUB IN).   LORazepam 1 MG tablet Commonly known as:  ATIVAN Take 1 tablet (1 mg total) by mouth every 8 (eight) hours as needed for anxiety (or nausea).   magnesium oxide 400 MG tablet Commonly known as:  MAG-OX TAKE 1 TABLET (400 MG TOTAL) BY MOUTH 2 (TWO) TIMES DAILY.   mesalamine 1.2 g EC tablet Commonly known as:  LIALDA TAKE 4 TABLETS (4.8 G TOTAL) BY MOUTH DAILY WITH BREAKFAST.   omeprazole 40 MG  capsule Commonly known as:  PRILOSEC TAKE 1 CAPSULE BY MOUTH EVERY DAY   ondansetron 8 MG tablet Commonly known as:  ZOFRAN Take 1 tablet (8 mg total) by mouth every 8 (eight) hours as needed for nausea or vomiting.   oxyCODONE-acetaminophen 5-325 MG tablet Commonly known as:  PERCOCET/ROXICET Take 1 tablet by mouth every 4 (four) hours as needed for severe pain.   potassium chloride SA 20 MEQ tablet Commonly known as:  K-DUR,KLOR-CON Take 1 tablet (20 mEq total) by mouth daily.   PRESCRIPTION MEDICATION Antibody Plan [facility]   vancomycin 50 mg/mL oral solution Commonly known as:  VANCOCIN Take 2.5 mLs (125 mg total) by mouth every 6 (six) hours.   Vitamin D (Ergocalciferol) 50000 units Caps capsule Commonly known as:  DRISDOL Take 50,000 Units by mouth every 7 (seven) days.   zolpidem 12.5 MG CR tablet Commonly known as:  AMBIEN CR Take 1 tablet (12.5 mg total) by mouth at bedtime as needed for sleep.       Allergies  Allergen Reactions  . Penicillins Other (See Comments)    Unknown; childhood allergy  . Remeron [Mirtazapine] Other (See Comments)    nightmares  . Latex Rash    Consultations:  GI  Dr Irene Limbo   Procedures/Studies: Dg Chest 2 View  Result Date: 06/23/2017 CLINICAL DATA:  Fatigue and multiple falls at home. EXAM: CHEST  2 VIEW COMPARISON:  CT from 05/29/2017, CXR 11/03/2016 FINDINGS: The heart size and mediastinal contours are within normal limits. Aortic atherosclerosis at the arch without aneurysm is again seen. There is subsegmental atelectasis and/or scarring noted in in the left lower lobe and lingula. Mild hilar prominence on the right is believed to be a vascular summation based on recent CT. The visualized skeletal structures are unremarkable. Port catheter tip terminates in the distal SVC. IMPRESSION: No active cardiopulmonary disease. Lingular and left basilar atelectasis and/or scarring. Electronically Signed   By: Ashley Royalty M.D.   On:  06/23/2017 00:21   Ct Head Wo Contrast  Result Date: 06/23/2017 CLINICAL DATA:  76 year old female with weakness. EXAM: CT HEAD WITHOUT CONTRAST TECHNIQUE: Contiguous axial images were obtained from the base of the skull through the vertex without intravenous contrast. COMPARISON:  Head CT dated 09/07/2016 FINDINGS: Brain: There is stable cortical atrophy. Minimal periventricular and deep white matter chronic microvascular ischemic changes noted. There is no acute intracranial hemorrhage. No mass effect or midline shift. No extra-axial fluid collection. Vascular: No hyperdense vessel or unexpected calcification. Skull: Normal. Negative for fracture or focal lesion. Sinuses/Orbits: There is opacification of the majority of the left sphenoid sinus. The remainder of the visualized paranasal sinuses and mastoid air cells are clear. Other: None IMPRESSION: No acute intracranial pathology.  Stable cortical atrophy. Electronically Signed   By: Anner Crete M.D.   On: 06/23/2017 01:36   Ct Chest W Contrast  Addendum Date: 05/29/2017   ADDENDUM REPORT: 05/29/2017 10:46 ADDENDUM: These results will be called to the ordering clinician or representative by the Radiologist Assistant, and communication documented in the PACS or zVision Dashboard. Electronically Signed   By: Richardean Sale M.D.   On: 05/29/2017 10:46   Result Date: 05/29/2017 CLINICAL DATA:  History of metastatic lung cancer diagnosed in 2016. Chemotherapy, and immunotherapy and radiation therapy completed. Cough for 1 month. Diffuse abdominal pain. EXAM: CT CHEST, ABDOMEN, AND PELVIS WITH CONTRAST TECHNIQUE: Multidetector CT imaging of the chest, abdomen and pelvis was performed following the standard protocol during bolus administration of intravenous contrast. CONTRAST:  131mL ISOVUE-300 IOPAMIDOL (ISOVUE-300) INJECTION 61% COMPARISON:  CT 12/08/2016 and 10/03/2016. FINDINGS: CT CHEST FINDINGS Cardiovascular: No acute vascular findings are  demonstrated. There is chronic attenuation of the left lower lobe pulmonary artery. Atherosclerosis of aorta, great vessels and coronary artery is noted. Right IJ Port-A-Cath extends to the superior cavoatrial junction. The heart size is normal. There is no pericardial effusion. Mediastinum/Nodes: There are no enlarged mediastinal, hilar or axillary lymph nodes. Small hiatal hernia. Lungs/Pleura: No significant residual pleural effusion or discrete pleural based mass at the posterior left costophrenic angle. There is mild pleural thickening in this area. Moderate emphysema and scattered pulmonary parenchymal scarring are stable. Stellate scarring in the superior segment of the left lower lobe (images 78 through 85 of series 4), stable. Stable 5 mm right lower lobe nodule on image 96. Musculoskeletal/Chest wall: Healing fractures of the left tenth and eleventh ribs posteriorly. No worrisome osseous findings. CT ABDOMEN AND PELVIS FINDINGS Hepatobiliary: The liver appears stable with multiple well-circumscribed low-density lesions, most consistent with cysts. No enhancing or enlarging hepatic lesions are identified. No evidence of gallstones, gallbladder wall thickening or biliary dilatation. Pancreas: Unremarkable. No pancreatic ductal dilatation or surrounding inflammatory changes. Spleen: Normal in size without focal abnormality. Adrenals/Urinary Tract: Both adrenal glands appear normal. The kidneys appear normal without evidence of urinary tract calculus, suspicious lesion or hydronephrosis. No bladder abnormalities are seen. Stomach/Bowel: The stomach and small bowel appear normal aside from an incidental duodenal diverticulum. There is mild cecal wall thickening without surrounding inflammation. The cecum is mobile. The appendix appears normal. There are diverticular changes within the proximal sigmoid colon with associated wall thickening and surrounding inflammation, suspicious for diverticulitis. No evidence  of perforation, abscess or obstruction. Vascular/Lymphatic: There are no enlarged abdominal or pelvic lymph nodes. Diffuse aortic and branch vessel atherosclerosis. Reproductive: Hysterectomy.  No evidence of adnexal mass. Other: No evidence of abdominal wall mass or hernia. No ascites. Musculoskeletal: The mixed lytic and sclerotic lesion involving the left iliac bone and associated pathologic fracture extending into the sacroiliac joint are similar to the previous study. No evidence of associated enlarging soft tissue mass. No new lytic lesions are seen. IMPRESSION: 1. New sigmoid colon wall thickening in surrounding inflammation consistent with diverticulitis. No evidence of abscess, perforation or obstruction. 2. No residual pleural based mass or significant pleural effusion in the left hemithorax. No evidence of thoracic metastatic disease. 3. No evidence of progressive metastatic disease within the abdomen or pelvis. Mixed lytic and blastic lesion involving the left iliac bone and associated pathologic fracture are unchanged. Left rib fractures demonstrate partial healing and appear benign. 4. Stable incidental findings including hepatic cysts and Aortic Atherosclerosis (ICD10-I70.0). Electronically Signed: By: Richardean Sale M.D. On: 05/29/2017 10:32   Ct Abdomen Pelvis W Contrast  Result Date: 06/23/2017 CLINICAL DATA:  Left lower quadrant pain x2 days with  leukocytosis. EXAM: CT ABDOMEN AND PELVIS WITH CONTRAST TECHNIQUE: Multidetector CT imaging of the abdomen and pelvis was performed using the standard protocol following bolus administration of intravenous contrast. CONTRAST:  100 cc Isovue-300 COMPARISON:  05/29/2017 FINDINGS: Lower chest: The included heart is normal in size without pericardial effusion. There is bibasilar atelectasis and/or scarring. No effusion or pneumothorax. Small hiatal hernia. Hepatobiliary: Physiologic distention of the gallbladder without stones. Scattered stable water  attenuating cysts and/or tiny hemangiomata of the liver, the largest are adjacent to the gallbladder fossa measuring approximately 10 and 11 mm diameter. Some are too small to further characterize however. There is no biliary dilatation. Pancreas: Atrophic pancreas without inflammation. Spleen: Normal Adrenals/Urinary Tract: Normal bilateral adrenal glands. No enhancing renal mass or obstructive uropathy. No nephrolithiasis. Stomach/Bowel: There is diffuse transmural thickening of the descending colon through rectosigmoid consistent with colitis. No bowel obstruction is seen. A moderate amount of fecal retention is seen within the ascending and transverse colon. There mild fluid-filled distention of small bowel is noted without obstruction. Stomach is contracted. The appendix is not visualized. No pericecal inflammation is noted however. Vascular/Lymphatic: Aortoiliac atherosclerosis. No pathologically enlarged lymph nodes by CT size criteria. Reproductive: Hysterectomy.  No adnexal mass. Other: No free air nor free fluid.  No hernia. 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 Chronic posterior left lower rib fractures. IMPRESSION: 1. Colitis of the descending through rectosigmoid colon. 2. Mixed lytic and blastic lesion of the left iliac bone with pathologic fracture, bony expansion and thickening of the cortex is noted as before. Chronic left posterior rib fractures with healing. 3. Incidental findings of hepatic cysts and/or hemangiomata and aortoiliac atherosclerosis. Electronically Signed   By: Ashley Royalty M.D.   On: 06/23/2017 01:51   Ct Abdomen Pelvis W Contrast  Addendum Date: 05/29/2017   ADDENDUM REPORT: 05/29/2017 10:46 ADDENDUM: These results will be called to the ordering clinician or representative by  the Radiologist Assistant, and communication documented in the PACS or zVision Dashboard. Electronically Signed   By: Richardean Sale M.D.   On: 05/29/2017 10:46   Result Date: 05/29/2017 CLINICAL DATA:  History of metastatic lung cancer diagnosed in 2016. Chemotherapy, and immunotherapy and radiation therapy completed. Cough for 1 month. Diffuse abdominal pain. EXAM: CT CHEST, ABDOMEN, AND PELVIS WITH CONTRAST TECHNIQUE: Multidetector CT imaging of the chest, abdomen and pelvis was performed following the standard protocol during bolus administration of intravenous contrast. CONTRAST:  131mL ISOVUE-300 IOPAMIDOL (ISOVUE-300) INJECTION 61% COMPARISON:  CT 12/08/2016 and 10/03/2016. FINDINGS: CT CHEST FINDINGS Cardiovascular: No acute vascular findings are demonstrated. There is chronic attenuation of the left lower lobe pulmonary artery. Atherosclerosis of aorta, great vessels and coronary artery is noted. Right IJ Port-A-Cath extends to the superior cavoatrial junction. The heart size is normal. There is no pericardial effusion. Mediastinum/Nodes: There are no enlarged mediastinal, hilar or axillary lymph nodes. Small hiatal hernia. Lungs/Pleura: No significant residual pleural effusion or discrete pleural based mass at the posterior left costophrenic angle. There is mild pleural thickening in this area. Moderate emphysema and scattered pulmonary parenchymal scarring are stable. Stellate scarring in the superior segment of the left lower lobe (images 78 through 85 of series 4), stable. Stable 5 mm right lower lobe nodule on image 96. Musculoskeletal/Chest wall: Healing fractures of the left tenth and eleventh ribs posteriorly. No worrisome osseous findings. CT ABDOMEN AND  PELVIS FINDINGS Hepatobiliary: The liver appears stable with multiple well-circumscribed low-density lesions, most consistent with cysts. No enhancing or enlarging hepatic lesions are identified. No evidence of gallstones, gallbladder wall  thickening or biliary dilatation. Pancreas: Unremarkable. No pancreatic ductal dilatation or surrounding inflammatory changes. Spleen: Normal in size without focal abnormality. Adrenals/Urinary Tract: Both adrenal glands appear normal. The kidneys appear normal without evidence of urinary tract calculus, suspicious lesion or hydronephrosis. No bladder abnormalities are seen. Stomach/Bowel: The stomach and small bowel appear normal aside from an incidental duodenal diverticulum. There is mild cecal wall thickening without surrounding inflammation. The cecum is mobile. The appendix appears normal. There are diverticular changes within the proximal sigmoid colon with associated wall thickening and surrounding inflammation, suspicious for diverticulitis. No evidence of perforation, abscess or obstruction. Vascular/Lymphatic: There are no enlarged abdominal or pelvic lymph nodes. Diffuse aortic and branch vessel atherosclerosis. Reproductive: Hysterectomy.  No evidence of adnexal mass. Other: No evidence of abdominal wall mass or hernia. No ascites. Musculoskeletal: The mixed lytic and sclerotic lesion involving the left iliac bone and associated pathologic fracture extending into the sacroiliac joint are similar to the previous study. No evidence of associated enlarging soft tissue mass. No new lytic lesions are seen. IMPRESSION: 1. New sigmoid colon wall thickening in surrounding inflammation consistent with diverticulitis. No evidence of abscess, perforation or obstruction. 2. No residual pleural based mass or significant pleural effusion in the left hemithorax. No evidence of thoracic metastatic disease. 3. No evidence of progressive metastatic disease within the abdomen or pelvis. Mixed lytic and blastic lesion involving the left iliac bone and associated pathologic fracture are unchanged. Left rib fractures demonstrate partial healing and appear benign. 4. Stable incidental findings including hepatic cysts and  Aortic Atherosclerosis (ICD10-I70.0). Electronically Signed: By: Richardean Sale M.D. On: 05/29/2017 10:32      Subjective: Pain is better, no significant diarrhea  Discharge Exam: Vitals:   06/24/17 1235 06/24/17 1245  BP: (!) 132/35 (!) 127/45  Pulse: 76   Resp: 14 20  Temp:     Vitals:   06/24/17 0506 06/24/17 1227 06/24/17 1235 06/24/17 1245  BP: 101/80 (!) 91/36 (!) 132/35 (!) 127/45  Pulse: 76 68 76   Resp: 18 12 14 20   Temp: 98.5 F (36.9 C) 97.8 F (36.6 C)    TempSrc: Oral Oral    SpO2: 96% 96% 96%   Weight:      Height:        General: Pt is alert, awake, not in acute distress Cardiovascular: RRR, S1/S2 +, no rubs, no gallops Respiratory: CTA bilaterally, no wheezing, no rhonchi Abdominal: Soft, NT, ND, bowel sounds + Extremities: no edema, no cyanosis    The results of significant diagnostics from this hospitalization (including imaging, microbiology, ancillary and laboratory) are listed below for reference.     Microbiology: No results found for this or any previous visit (from the past 240 hour(s)).   Labs: BNP (last 3 results) No results for input(s): BNP in the last 8760 hours. Basic Metabolic Panel:  Recent Labs Lab 06/22/17 2142 06/24/17 0355  NA 131* 137  K 4.3 4.3  CL 98* 104  CO2 23 25  GLUCOSE 138* 115*  BUN 16 16  CREATININE 0.88 1.00  CALCIUM 8.1* 7.5*   Liver Function Tests:  Recent Labs Lab 06/22/17 2142 06/24/17 0355  AST 24 15  ALT 21 15  ALKPHOS 88 65  BILITOT 0.8 0.3  PROT 7.7 6.2*  ALBUMIN 3.3* 2.6*  Recent Labs Lab 06/22/17 2142  LIPASE 17   No results for input(s): AMMONIA in the last 168 hours. CBC:  Recent Labs Lab 06/22/17 2142 06/23/17 0741  WBC 11.7* 11.8*  NEUTROABS  --  8.2*  HGB 11.7* 12.0  HCT 35.6* 36.5  MCV 86.2 85.7  PLT 225 228   Cardiac Enzymes: No results for input(s): CKTOTAL, CKMB, CKMBINDEX, TROPONINI in the last 168 hours. BNP: Invalid input(s): POCBNP CBG: No  results for input(s): GLUCAP in the last 168 hours. D-Dimer No results for input(s): DDIMER in the last 72 hours. Hgb A1c No results for input(s): HGBA1C in the last 72 hours. Lipid Profile No results for input(s): CHOL, HDL, LDLCALC, TRIG, CHOLHDL, LDLDIRECT in the last 72 hours. Thyroid function studies No results for input(s): TSH, T4TOTAL, T3FREE, THYROIDAB in the last 72 hours.  Invalid input(s): FREET3 Anemia work up No results for input(s): VITAMINB12, FOLATE, FERRITIN, TIBC, IRON, RETICCTPCT in the last 72 hours. Urinalysis    Component Value Date/Time   COLORURINE YELLOW 06/23/2017 0154   APPEARANCEUR CLEAR 06/23/2017 0154   LABSPEC 1.024 06/23/2017 0154   PHURINE 5.0 06/23/2017 0154   GLUCOSEU NEGATIVE 06/23/2017 0154   HGBUR NEGATIVE 06/23/2017 0154   BILIRUBINUR NEGATIVE 06/23/2017 0154   KETONESUR 5 (A) 06/23/2017 0154   PROTEINUR NEGATIVE 06/23/2017 0154   NITRITE NEGATIVE 06/23/2017 0154   LEUKOCYTESUR NEGATIVE 06/23/2017 0154   Sepsis Labs Invalid input(s): PROCALCITONIN,  WBC,  LACTICIDVEN Microbiology No results found for this or any previous visit (from the past 240 hour(s)).   Time coordinating discharge: Over 30 minutes  SIGNED:   Elmarie Shiley, MD  [facility] 06/24/2017, 1:56 PM Pager   If 7PM-7AM, please contact night-coverage [email or web address] Password [email or web address]

## 2017-06-24 NOTE — H&P (View-Only) (Signed)
Progress Note   Subjective  Patient continues to have some LLQ pain, slowly feeling better. Not much diarrhea.    Objective   Vital signs in last 24 hours: Temp:  [98 F (36.7 C)-98.6 F (37 C)] 98.5 F (36.9 C) (07/21 0506) Pulse Rate:  [72-81] 76 (07/21 0506) Resp:  [18-20] 18 (07/21 0506) BP: (101-129)/(42-80) 101/80 (07/21 0506) SpO2:  [95 %-98 %] 96 % (07/21 0506) Weight:  [132 lb 0.9 oz (59.9 kg)] 132 lb 0.9 oz (59.9 kg) (07/20 0902) Last BM Date: 06/23/17 General:    white female in NAD Heart:  Regular rate and rhythm; no murmurs Lungs: Respirations even and unlabored, lungs CTA bilaterally Abdomen:  Soft, some LLQ TTP without rebound / guarding, nondistended.  Extremities:  Without edema. Neurologic:  Alert and oriented,  grossly normal neurologically. Psych:  Cooperative. Normal mood and affect.  Intake/Output from previous day: 07/20 0701 - 07/21 0700 In: 1862.5 [P.O.:120; I.V.:1542.5; IV Piggyback:200] Out: 750 [Urine:750] Intake/Output this shift: No intake/output data recorded.  Lab Results:  Recent Labs  06/22/17 2142 06/23/17 0741  WBC 11.7* 11.8*  HGB 11.7* 12.0  HCT 35.6* 36.5  PLT 225 228   BMET  Recent Labs  06/22/17 2142 06/24/17 0355  NA 131* 137  K 4.3 4.3  CL 98* 104  CO2 23 25  GLUCOSE 138* 115*  BUN 16 16  CREATININE 0.88 1.00  CALCIUM 8.1* 7.5*   LFT  Recent Labs  06/24/17 0355  PROT 6.2*  ALBUMIN 2.6*  AST 15  ALT 15  ALKPHOS 65  BILITOT 0.3   PT/INR No results for input(s): LABPROT, INR in the last 72 hours.  Studies/Results: Dg Chest 2 View  Result Date: 06/23/2017 CLINICAL DATA:  Fatigue and multiple falls at home. EXAM: CHEST  2 VIEW COMPARISON:  CT from 05/29/2017, CXR 11/03/2016 FINDINGS: The heart size and mediastinal contours are within normal limits. Aortic atherosclerosis at the arch without aneurysm is again seen. There is subsegmental atelectasis and/or scarring noted in in the left lower lobe  and lingula. Mild hilar prominence on the right is believed to be a vascular summation based on recent CT. The visualized skeletal structures are unremarkable. Port catheter tip terminates in the distal SVC. IMPRESSION: No active cardiopulmonary disease. Lingular and left basilar atelectasis and/or scarring. Electronically Signed   By: Ashley Royalty M.D.   On: 06/23/2017 00:21   Ct Head Wo Contrast  Result Date: 06/23/2017 CLINICAL DATA:  76 year old female with weakness. EXAM: CT HEAD WITHOUT CONTRAST TECHNIQUE: Contiguous axial images were obtained from the base of the skull through the vertex without intravenous contrast. COMPARISON:  Head CT dated 09/07/2016 FINDINGS: Brain: There is stable cortical atrophy. Minimal periventricular and deep white matter chronic microvascular ischemic changes noted. There is no acute intracranial hemorrhage. No mass effect or midline shift. No extra-axial fluid collection. Vascular: No hyperdense vessel or unexpected calcification. Skull: Normal. Negative for fracture or focal lesion. Sinuses/Orbits: There is opacification of the majority of the left sphenoid sinus. The remainder of the visualized paranasal sinuses and mastoid air cells are clear. Other: None IMPRESSION: No acute intracranial pathology.  Stable cortical atrophy. Electronically Signed   By: Anner Crete M.D.   On: 06/23/2017 01:36   Ct Abdomen Pelvis W Contrast  Result Date: 06/23/2017 CLINICAL DATA:  Left lower quadrant pain x2 days with leukocytosis. EXAM: CT ABDOMEN AND PELVIS WITH CONTRAST TECHNIQUE: Multidetector CT imaging of the abdomen and pelvis was performed  using the standard protocol following bolus administration of intravenous contrast. CONTRAST:  100 cc Isovue-300 COMPARISON:  05/29/2017 FINDINGS: Lower chest: The included heart is normal in size without pericardial effusion. There is bibasilar atelectasis and/or scarring. No effusion or pneumothorax. Small hiatal hernia. Hepatobiliary:  Physiologic distention of the gallbladder without stones. Scattered stable water attenuating cysts and/or tiny hemangiomata of the liver, the largest are adjacent to the gallbladder fossa measuring approximately 10 and 11 mm diameter. Some are too small to further characterize however. There is no biliary dilatation. Pancreas: Atrophic pancreas without inflammation. Spleen: Normal Adrenals/Urinary Tract: Normal bilateral adrenal glands. No enhancing renal mass or obstructive uropathy. No nephrolithiasis. Stomach/Bowel: There is diffuse transmural thickening of the descending colon through rectosigmoid consistent with colitis. No bowel obstruction is seen. A moderate amount of fecal retention is seen within the ascending and transverse colon. There mild fluid-filled distention of small bowel is noted without obstruction. Stomach is contracted. The appendix is not visualized. No pericecal inflammation is noted however. Vascular/Lymphatic: Aortoiliac atherosclerosis. No pathologically enlarged lymph nodes by CT size criteria. Reproductive: Hysterectomy.  No adnexal mass. Other: No free air nor free fluid.  No hernia. 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 Chronic posterior left lower rib fractures. IMPRESSION: 1. Colitis of the descending through rectosigmoid colon. 2. Mixed lytic and blastic lesion of the left iliac bone with pathologic fracture, bony expansion and thickening of the cortex is noted as before. Chronic left posterior rib fractures with healing. 3. Incidental findings of hepatic cysts and/or hemangiomata and aortoiliac atherosclerosis. Electronically Signed   By: Ashley Royalty M.D.   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 CT shows colitis of the left colon. She was previously on budesonide but stopped. Stool testing pending to rule out infectious etiology, but she doesn't have significant diarrhea, this seems less likely. No blood in her stools, ischemic colitis seems less likely. She has been off Nivolumab for some time now, it would seem unlikely to be recurrent colitis from this, it's otherwise possible she's developed IBD. She is better from admission but still in some discomfort. On flagyl empirically. I've discussed flex sig with her for today to help clarify her diagnosis given she's had some ongoing issues with this, appears to have a flare, and more definitively rule out IBD. She has eaten today but was agreeable to do this unsedated.     Please hold morning dose of Lovenox, I will plan on taking biopsies during her procedure, and can resume Lovenox later today after her procedure (last given at 9 AM yesterday).   [place] Cellar, MD [facility] Pager [phone]

## 2017-06-24 NOTE — Progress Notes (Signed)
Progress Note   Subjective  Patient continues to have some LLQ pain, slowly feeling better. Not much diarrhea.    Objective   Vital signs in last 24 hours: Temp:  [98 F (36.7 C)-98.6 F (37 C)] 98.5 F (36.9 C) (07/21 0506) Pulse Rate:  [72-81] 76 (07/21 0506) Resp:  [18-20] 18 (07/21 0506) BP: (101-129)/(42-80) 101/80 (07/21 0506) SpO2:  [95 %-98 %] 96 % (07/21 0506) Weight:  [132 lb 0.9 oz (59.9 kg)] 132 lb 0.9 oz (59.9 kg) (07/20 0902) Last BM Date: 06/23/17 General:    white female in NAD Heart:  Regular rate and rhythm; no murmurs Lungs: Respirations even and unlabored, lungs CTA bilaterally Abdomen:  Soft, some LLQ TTP without rebound / guarding, nondistended.  Extremities:  Without edema. Neurologic:  Alert and oriented,  grossly normal neurologically. Psych:  Cooperative. Normal mood and affect.  Intake/Output from previous day: 07/20 0701 - 07/21 0700 In: 1862.5 [P.O.:120; I.V.:1542.5; IV Piggyback:200] Out: 750 [Urine:750] Intake/Output this shift: No intake/output data recorded.  Lab Results:  Recent Labs  06/22/17 2142 06/23/17 0741  WBC 11.7* 11.8*  HGB 11.7* 12.0  HCT 35.6* 36.5  PLT 225 228   BMET  Recent Labs  06/22/17 2142 06/24/17 0355  NA 131* 137  K 4.3 4.3  CL 98* 104  CO2 23 25  GLUCOSE 138* 115*  BUN 16 16  CREATININE 0.88 1.00  CALCIUM 8.1* 7.5*   LFT  Recent Labs  06/24/17 0355  PROT 6.2*  ALBUMIN 2.6*  AST 15  ALT 15  ALKPHOS 65  BILITOT 0.3   PT/INR No results for input(s): LABPROT, INR in the last 72 hours.  Studies/Results: Dg Chest 2 View  Result Date: 06/23/2017 CLINICAL DATA:  Fatigue and multiple falls at home. EXAM: CHEST  2 VIEW COMPARISON:  CT from 05/29/2017, CXR 11/03/2016 FINDINGS: The heart size and mediastinal contours are within normal limits. Aortic atherosclerosis at the arch without aneurysm is again seen. There is subsegmental atelectasis and/or scarring noted in in the left lower lobe  and lingula. Mild hilar prominence on the right is believed to be a vascular summation based on recent CT. The visualized skeletal structures are unremarkable. Port catheter tip terminates in the distal SVC. IMPRESSION: No active cardiopulmonary disease. Lingular and left basilar atelectasis and/or scarring. Electronically Signed   By: Ashley Royalty M.D.   On: 06/23/2017 00:21   Ct Head Wo Contrast  Result Date: 06/23/2017 CLINICAL DATA:  76 year old female with weakness. EXAM: CT HEAD WITHOUT CONTRAST TECHNIQUE: Contiguous axial images were obtained from the base of the skull through the vertex without intravenous contrast. COMPARISON:  Head CT dated 09/07/2016 FINDINGS: Brain: There is stable cortical atrophy. Minimal periventricular and deep white matter chronic microvascular ischemic changes noted. There is no acute intracranial hemorrhage. No mass effect or midline shift. No extra-axial fluid collection. Vascular: No hyperdense vessel or unexpected calcification. Skull: Normal. Negative for fracture or focal lesion. Sinuses/Orbits: There is opacification of the majority of the left sphenoid sinus. The remainder of the visualized paranasal sinuses and mastoid air cells are clear. Other: None IMPRESSION: No acute intracranial pathology.  Stable cortical atrophy. Electronically Signed   By: Anner Crete M.D.   On: 06/23/2017 01:36   Ct Abdomen Pelvis W Contrast  Result Date: 06/23/2017 CLINICAL DATA:  Left lower quadrant pain x2 days with leukocytosis. EXAM: CT ABDOMEN AND PELVIS WITH CONTRAST TECHNIQUE: Multidetector CT imaging of the abdomen and pelvis was performed  using the standard protocol following bolus administration of intravenous contrast. CONTRAST:  100 cc Isovue-300 COMPARISON:  05/29/2017 FINDINGS: Lower chest: The included heart is normal in size without pericardial effusion. There is bibasilar atelectasis and/or scarring. No effusion or pneumothorax. Small hiatal hernia. Hepatobiliary:  Physiologic distention of the gallbladder without stones. Scattered stable water attenuating cysts and/or tiny hemangiomata of the liver, the largest are adjacent to the gallbladder fossa measuring approximately 10 and 11 mm diameter. Some are too small to further characterize however. There is no biliary dilatation. Pancreas: Atrophic pancreas without inflammation. Spleen: Normal Adrenals/Urinary Tract: Normal bilateral adrenal glands. No enhancing renal mass or obstructive uropathy. No nephrolithiasis. Stomach/Bowel: There is diffuse transmural thickening of the descending colon through rectosigmoid consistent with colitis. No bowel obstruction is seen. A moderate amount of fecal retention is seen within the ascending and transverse colon. There mild fluid-filled distention of small bowel is noted without obstruction. Stomach is contracted. The appendix is not visualized. No pericecal inflammation is noted however. Vascular/Lymphatic: Aortoiliac atherosclerosis. No pathologically enlarged lymph nodes by CT size criteria. Reproductive: Hysterectomy.  No adnexal mass. Other: No free air nor free fluid.  No hernia. 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 Chronic posterior left lower rib fractures. IMPRESSION: 1. Colitis of the descending through rectosigmoid colon. 2. Mixed lytic and blastic lesion of the left iliac bone with pathologic fracture, bony expansion and thickening of the cortex is noted as before. Chronic left posterior rib fractures with healing. 3. Incidental findings of hepatic cysts and/or hemangiomata and aortoiliac atherosclerosis. Electronically Signed   By: Ashley Royalty M.D.   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 CT shows colitis of the left colon. She was previously on budesonide but stopped. Stool testing pending to rule out infectious etiology, but she doesn't have significant diarrhea, this seems less likely. No blood in her stools, ischemic colitis seems less likely. She has been off Nivolumab for some time now, it would seem unlikely to be recurrent colitis from this, it's otherwise possible she's developed IBD. She is better from admission but still in some discomfort. On flagyl empirically. I've discussed flex sig with her for today to help clarify her diagnosis given she's had some ongoing issues with this, appears to have a flare, and more definitively rule out IBD. She has eaten today but was agreeable to do this unsedated.     Please hold morning dose of Lovenox, I will plan on taking biopsies during her procedure, and can resume Lovenox later today after her procedure (last given at 9 AM yesterday).   [place] Cellar, MD [facility] Pager [phone]

## 2017-06-24 NOTE — Care Management Note (Signed)
Case Management Note  Patient Details  Name: Cindy Byrd MRN: [id number] Date of Birth: 1941/11/05  Subjective/Objective:    Bilateral PE, colitis, c diff                Action/Plan: Discharge Planning: NCM spoke to pt and boyfriend at home to assist with care. Contacted [facility] and they do have Lovenox and Vancomycin in stock. They will contact pt with copay cost.   PCP Tamsen Roers MD  Expected Discharge Date:  06/24/17               Expected Discharge Plan:  Home/Self Care  In-House Referral:  NA  Discharge planning Services  CM Consult, Medication Assistance  Post Acute Care Choice:  NA Choice offered to:  NA  DME Arranged:  N/A DME Agency:  NA  HH Arranged:  NA HH Agency:  NA  Status of Service:  Completed, signed off  If discussed at [place] of Stay Meetings, dates discussed:    Additional Comments:  Erenest Rasher, RN 06/24/2017, 3:19 PM

## 2017-06-24 NOTE — Interval H&P Note (Signed)
History and Physical Interval Note:  06/24/2017 12:25 PM  Cindy Byrd  has presented today for surgery, with the diagnosis of colitis  The various methods of treatment have been discussed with the patient and family. After consideration of risks, benefits and other options for treatment, the patient has consented to  Procedure(s): FLEXIBLE SIGMOIDOSCOPY (N/A) as a surgical intervention .  The patient's history has been reviewed, patient examined, no change in status, stable for surgery.  I have reviewed the patient's chart and labs.  Questions were answered to the patient's satisfaction.     Renelda Loma [name]

## 2017-06-26 ENCOUNTER — Ambulatory Visit (HOSPITAL_BASED_OUTPATIENT_CLINIC_OR_DEPARTMENT_OTHER): Payer: Medicare Other

## 2017-06-26 ENCOUNTER — Other Ambulatory Visit (HOSPITAL_BASED_OUTPATIENT_CLINIC_OR_DEPARTMENT_OTHER): Payer: Medicare Other

## 2017-06-26 ENCOUNTER — Ambulatory Visit (HOSPITAL_BASED_OUTPATIENT_CLINIC_OR_DEPARTMENT_OTHER): Payer: Medicare Other | Admitting: Hematology

## 2017-06-26 ENCOUNTER — Encounter (HOSPITAL_COMMUNITY): Payer: Self-pay | Admitting: Gastroenterology

## 2017-06-26 VITALS — BP 176/66 | HR 63 | Temp 97.9°F | Resp 18 | Ht 68.0 in | Wt 129.5 lb

## 2017-06-26 DIAGNOSIS — R1032 Left lower quadrant pain: Secondary | ICD-10-CM | POA: Diagnosis not present

## 2017-06-26 DIAGNOSIS — C3490 Malignant neoplasm of unspecified part of unspecified bronchus or lung: Secondary | ICD-10-CM

## 2017-06-26 DIAGNOSIS — C7951 Secondary malignant neoplasm of bone: Secondary | ICD-10-CM

## 2017-06-26 DIAGNOSIS — Z452 Encounter for adjustment and management of vascular access device: Secondary | ICD-10-CM | POA: Diagnosis not present

## 2017-06-26 DIAGNOSIS — Z7901 Long term (current) use of anticoagulants: Secondary | ICD-10-CM

## 2017-06-26 DIAGNOSIS — C3491 Malignant neoplasm of unspecified part of right bronchus or lung: Secondary | ICD-10-CM

## 2017-06-26 DIAGNOSIS — I2699 Other pulmonary embolism without acute cor pulmonale: Secondary | ICD-10-CM

## 2017-06-26 DIAGNOSIS — Z95828 Presence of other vascular implants and grafts: Secondary | ICD-10-CM

## 2017-06-26 LAB — CBC & DIFF AND RETIC
BASO%: 0.5 % (ref 0.0–2.0)
Basophils Absolute: 0 10*3/uL (ref 0.0–0.1)
EOS%: 2.6 % (ref 0.0–7.0)
Eosinophils Absolute: 0.2 10*3/uL (ref 0.0–0.5)
HCT: 33.7 % — ABNORMAL LOW (ref 34.8–46.6)
HGB: 10.8 g/dL — ABNORMAL LOW (ref 11.6–15.9)
Immature Retic Fract: 15.4 % — ABNORMAL HIGH (ref 1.60–10.00)
LYMPH%: 16.9 % (ref 14.0–49.7)
MCH: 28.6 pg (ref 25.1–34.0)
MCHC: 32 g/dL (ref 31.5–36.0)
MCV: 89.2 fL (ref 79.5–101.0)
MONO#: 0.5 10*3/uL (ref 0.1–0.9)
MONO%: 7.6 % (ref 0.0–14.0)
NEUT%: 72.4 % (ref 38.4–76.8)
NEUTROS ABS: 4.5 10*3/uL (ref 1.5–6.5)
NRBC: 0 % (ref 0–0)
PLATELETS: 212 10*3/uL (ref 145–400)
RBC: 3.78 10*6/uL (ref 3.70–5.45)
RDW: 15.1 % — AB (ref 11.2–14.5)
Retic %: 1.42 % (ref 0.70–2.10)
Retic Ct Abs: 53.68 10*3/uL (ref 33.70–90.70)
WBC: 6.2 10*3/uL (ref 3.9–10.3)
lymph#: 1 10*3/uL (ref 0.9–3.3)

## 2017-06-26 LAB — COMPREHENSIVE METABOLIC PANEL
ALT: 11 U/L (ref 0–55)
ANION GAP: 7 meq/L (ref 3–11)
AST: 21 U/L (ref 5–34)
Albumin: 2.9 g/dL — ABNORMAL LOW (ref 3.5–5.0)
Alkaline Phosphatase: 75 U/L (ref 40–150)
BILIRUBIN TOTAL: 0.22 mg/dL (ref 0.20–1.20)
BUN: 11.6 mg/dL (ref 7.0–26.0)
CHLORIDE: 108 meq/L (ref 98–109)
CO2: 24 meq/L (ref 22–29)
Calcium: 8.8 mg/dL (ref 8.4–10.4)
Creatinine: 0.8 mg/dL (ref 0.6–1.1)
EGFR: 72 mL/min/{1.73_m2} — AB (ref >90)
Glucose: 115 mg/dl (ref 70–140)
POTASSIUM: 4 meq/L (ref 3.5–5.1)
SODIUM: 138 meq/L (ref 136–145)
TOTAL PROTEIN: 7.3 g/dL (ref 6.4–8.3)

## 2017-06-26 LAB — MAGNESIUM: Magnesium: 1.8 mg/dl (ref 1.5–2.5)

## 2017-06-26 MED ORDER — ENOXAPARIN SODIUM 80 MG/0.8ML ~~LOC~~ SOLN
80.0000 mg | SUBCUTANEOUS | 2 refills | Status: DC
Start: 2017-06-26 — End: 2018-03-08

## 2017-06-26 MED ORDER — HEPARIN SOD (PORK) LOCK FLUSH 100 UNIT/ML IV SOLN
500.0000 [IU] | Freq: Once | INTRAVENOUS | Status: AC
  Administered 2017-06-26: 500 [IU] via INTRAVENOUS
  Filled 2017-06-26: qty 5

## 2017-06-26 MED ORDER — DENOSUMAB 120 MG/1.7ML ~~LOC~~ SOLN
120.0000 mg | Freq: Once | SUBCUTANEOUS | Status: AC
  Administered 2017-06-26: 120 mg via SUBCUTANEOUS
  Filled 2017-06-26: qty 1.7

## 2017-06-26 MED ORDER — OCTREOTIDE ACETATE 30 MG IM KIT
30.0000 mg | PACK | Freq: Once | INTRAMUSCULAR | Status: AC
  Administered 2017-06-26: 30 mg via INTRAMUSCULAR
  Filled 2017-06-26: qty 1

## 2017-06-26 MED ORDER — SODIUM CHLORIDE 0.9% FLUSH
10.0000 mL | INTRAVENOUS | Status: DC | PRN
  Administered 2017-06-26: 10 mL via INTRAVENOUS
  Filled 2017-06-26: qty 10

## 2017-06-26 NOTE — Patient Instructions (Signed)
Octreotide injection solution What is this medicine? OCTREOTIDE (ok TREE oh tide) is used to reduce blood levels of growth hormone in patients with a condition called acromegaly. This medicine also reduces flushing and watery diarrhea caused by certain types of cancer. This medicine may be used for other purposes; ask your health care provider or pharmacist if you have questions. COMMON BRAND NAME(S): Sandostatin, Sandostatin LAR What should I tell my health care provider before I take this medicine? They need to know if you have any of these conditions: -gallbladder disease -kidney disease -liver disease -an unusual or allergic reaction to octreotide, other medicines, foods, dyes, or preservatives -pregnant or trying to get pregnant -breast-feeding How should I use this medicine? This medicine is for injection under the skin or into a vein (only in emergency situations). It is usually given by a health care professional in a hospital or clinic setting. If you get this medicine at home, you will be taught how to prepare and give this medicine. Allow the injection solution to come to room temperature before use. Do not warm it artificially. Use exactly as directed. Take your medicine at regular intervals. Do not take your medicine more often than directed. It is important that you put your used needles and syringes in a special sharps container. Do not put them in a trash can. If you do not have a sharps container, call your pharmacist or healthcare provider to get one. Talk to your pediatrician regarding the use of this medicine in children. Special care may be needed. Overdosage: If you think you have taken too much of this medicine contact a poison control center or emergency room at once. NOTE: This medicine is only for you. Do not share this medicine with others. What if I miss a dose? If you miss a dose, take it as soon as you can. If it is almost time for your next dose, take only that  dose. Do not take double or extra doses. What may interact with this medicine? Do not take this medicine with any of the following medications: -cisapride -droperidol -general anesthetics -grepafloxacin -perphenazine -thioridazine This medicine may also interact with the following medications: -bromocriptine -cyclosporine -diuretics -medicines for blood pressure, heart disease, irregular heart beat -medicines for diabetes, including insulin -quinidine This list may not describe all possible interactions. Give your health care provider a list of all the medicines, herbs, non-prescription drugs, or dietary supplements you use. Also tell them if you smoke, drink alcohol, or use illegal drugs. Some items may interact with your medicine. What should I watch for while using this medicine? Visit your doctor or health care professional for regular checks on your progress. To help reduce irritation at the injection site, use a different site for each injection and make sure the solution is at room temperature before use. This medicine may cause increases or decreases in blood sugar. Signs of high blood sugar include frequent urination, unusual thirst, flushed or dry skin, difficulty breathing, drowsiness, stomach ache, nausea, vomiting or dry mouth. Signs of low blood sugar include chills, cool, pale skin or cold sweats, drowsiness, extreme hunger, fast heartbeat, headache, nausea, nervousness or anxiety, shakiness, trembling, unsteadiness, tiredness, or weakness. Contact your doctor or health care professional right away if you experience any of these symptoms. What side effects may I notice from receiving this medicine? Side effects that you should report to your doctor or health care professional as soon as possible: -allergic reactions like skin rash, itching or hives, swelling   of the face, lips, or tongue -changes in blood sugar -changes in heart rate -severe stomach pain Side effects that  usually do not require medical attention (report to your doctor or health care professional if they continue or are bothersome): -diarrhea or constipation -gas or stomach pain -nausea, vomiting -pain, redness, swelling and irritation at site where injected This list may not describe all possible side effects. Call your doctor for medical advice about side effects. You may report side effects to FDA at 1-800-FDA-1088. Where should I keep my medicine? Keep out of the reach of children. Store in a refrigerator between 2 and 8 degrees C (36 and 46 degrees F). Protect from light. Allow to come to room temperature naturally. Do not use artificial heat. If protected from light, the injection may be stored at room temperature between 20 and 30 degrees C (70 and 86 degrees F) for 14 days. After the initial use, throw away any unused portion of a multiple dose vial after 14 days. Throw away unused portions of the ampules after use. NOTE: This sheet is a summary. It may not cover all possible information. If you have questions about this medicine, talk to your doctor, pharmacist, or health care provider.  2018 Elsevier/Gold Standard (2008-06-17 16:56:04)  Denosumab injection What is this medicine? DENOSUMAB (den oh sue mab) slows bone breakdown. Prolia is used to treat osteoporosis in women after menopause and in men. Delton See is used to treat a high calcium level due to cancer and to prevent bone fractures and other bone problems caused by multiple myeloma or cancer bone metastases. Delton See is also used to treat giant cell tumor of the bone. This medicine may be used for other purposes; ask your health care provider or pharmacist if you have questions. COMMON BRAND NAME(S): Prolia, XGEVA What should I tell my health care provider before I take this medicine? They need to know if you have any of these conditions: -dental disease -having surgery or tooth extraction -infection -kidney disease -low levels of  calcium or Vitamin D in the blood -malnutrition -on hemodialysis -skin conditions or sensitivity -thyroid or parathyroid disease -an unusual reaction to denosumab, other medicines, foods, dyes, or preservatives -pregnant or trying to get pregnant -breast-feeding How should I use this medicine? This medicine is for injection under the skin. It is given by a health care professional in a hospital or clinic setting. If you are getting Prolia, a special MedGuide will be given to you by the pharmacist with each prescription and refill. Be sure to read this information carefully each time. For Prolia, talk to your pediatrician regarding the use of this medicine in children. Special care may be needed. For Delton See, talk to your pediatrician regarding the use of this medicine in children. While this drug may be prescribed for children as young as 13 years for selected conditions, precautions do apply. Overdosage: If you think you have taken too much of this medicine contact a poison control center or emergency room at once. NOTE: This medicine is only for you. Do not share this medicine with others. What if I miss a dose? It is important not to miss your dose. Call your doctor or health care professional if you are unable to keep an appointment. What may interact with this medicine? Do not take this medicine with any of the following medications: -other medicines containing denosumab This medicine may also interact with the following medications: -medicines that lower your chance of fighting infection -steroid medicines like prednisone  or cortisone This list may not describe all possible interactions. Give your health care provider a list of all the medicines, herbs, non-prescription drugs, or dietary supplements you use. Also tell them if you smoke, drink alcohol, or use illegal drugs. Some items may interact with your medicine. What should I watch for while using this medicine? Visit your doctor or  health care professional for regular checks on your progress. Your doctor or health care professional may order blood tests and other tests to see how you are doing. Call your doctor or health care professional for advice if you get a fever, chills or sore throat, or other symptoms of a cold or flu. Do not treat yourself. This drug may decrease your body's ability to fight infection. Try to avoid being around people who are sick. You should make sure you get enough calcium and vitamin D while you are taking this medicine, unless your doctor tells you not to. Discuss the foods you eat and the vitamins you take with your health care professional. See your dentist regularly. Brush and floss your teeth as directed. Before you have any dental work done, tell your dentist you are receiving this medicine. Do not become pregnant while taking this medicine or for 5 months after stopping it. Talk with your doctor or health care professional about your birth control options while taking this medicine. Women should inform their doctor if they wish to become pregnant or think they might be pregnant. There is a potential for serious side effects to an unborn child. Talk to your health care professional or pharmacist for more information. What side effects may I notice from receiving this medicine? Side effects that you should report to your doctor or health care professional as soon as possible: -allergic reactions like skin rash, itching or hives, swelling of the face, lips, or tongue -bone pain -breathing problems -dizziness -jaw pain, especially after dental work -redness, blistering, peeling of the skin -signs and symptoms of infection like fever or chills; cough; sore throat; pain or trouble passing urine -signs of low calcium like fast heartbeat, muscle cramps or muscle pain; pain, tingling, numbness in the hands or feet; seizures -unusual bleeding or bruising -unusually weak or tired Side effects that  usually do not require medical attention (report to your doctor or health care professional if they continue or are bothersome): -constipation -diarrhea -headache -joint pain -loss of appetite -muscle pain -runny nose -tiredness -upset stomach This list may not describe all possible side effects. Call your doctor for medical advice about side effects. You may report side effects to FDA at 1-800-FDA-1088. Where should I keep my medicine? This medicine is only given in a clinic, doctor's office, or other health care setting and will not be stored at home. NOTE: This sheet is a summary. It may not cover all possible information. If you have questions about this medicine, talk to your doctor, pharmacist, or health care provider.  2018 Elsevier/Gold Standard (2016-12-13 19:17:21)   

## 2017-06-26 NOTE — Patient Instructions (Signed)
Thank you for choosing [facility] to provide your oncology and hematology care.  To afford each patient quality time with our providers, please arrive 30 minutes before your scheduled appointment time.  If you arrive late for your appointment, you may be asked to reschedule.  We strive to give you quality time with our providers, and arriving late affects you and other patients whose appointments are after yours.  If you are a no show for multiple scheduled visits, you may be dismissed from the clinic at the providers discretion.   Again, thank you for choosing [facility], our hope is that these requests will decrease the amount of time that you wait before being seen by our physicians.  ______________________________________________________________________ Should you have questions after your visit to the  Cancer Center, please contact our office at [phone] between the hours of 8:30 and 4:30 p.m.    Voicemails left after 4:30p.m will not be returned until the following business day.   For prescription refill requests, please have your pharmacy contact us directly.  Please also try to allow 48 hours for prescription requests.   Please contact the scheduling department for questions regarding scheduling.  For scheduling of procedures such as PET scans, CT scans, MRI, Ultrasound, etc please contact central scheduling at [phone].   Resources For Cancer Patients and Caregivers:  American Cancer Society:  800-227-2345  Can help patients locate various types of support and financial assistance Cancer Care: 1-800-813-HOPE (4673) Provides financial assistance, online support groups, medication/co-pay assistance.   [place] DSS:  336-641-3447 Where to apply for food stamps, Medicaid, and utility assistance Medicare Rights Center: 800-333-4114 Helps people with Medicare understand their rights and benefits, navigate the Medicare system, and secure the  quality healthcare they deserve SCAT: 336-333-6589 [employer] [employer]'s shared-ride transportation service for eligible riders who have a disability that prevents them from riding the fixed route bus.   For additional information on assistance programs please contact our social worker:   Grier Hock/Abigail Elmore:  [phone] 

## 2017-06-27 ENCOUNTER — Telehealth: Payer: Self-pay | Admitting: Hematology

## 2017-06-27 NOTE — Telephone Encounter (Signed)
Spoke with patient re next appointment for 8/20. Patient will get updated schedule at 8/20. Visit.

## 2017-07-14 ENCOUNTER — Telehealth: Payer: Self-pay | Admitting: Hematology

## 2017-07-14 NOTE — Telephone Encounter (Signed)
Scheduled appt per 8/10 sch message - patient is aware of new appt time .

## 2017-07-20 ENCOUNTER — Ambulatory Visit: Payer: Medicare Other

## 2017-07-20 ENCOUNTER — Other Ambulatory Visit: Payer: Medicare Other

## 2017-07-23 ENCOUNTER — Other Ambulatory Visit: Payer: Self-pay | Admitting: Hematology

## 2017-07-23 NOTE — Progress Notes (Signed)
[name]    HEMATOLOGY ONCOLOGY PROGRESS NOTE  Date of service: .06/26/2017  Patient Care Team: Tamsen Roers, MD as PCP - General (Family Medicine) [name], MD as Consulting Physician (Hematology and Oncology)  CHIEF COMPLAINTS/PURPOSE OF CONSULTATION: Follow-up for metastatic lung cancer  DIAGNOSIS:   #1 Metastatic non-small cell lung cancer with bilateral lung nodules and large metastatic lesion in the left Ilium. #2 Barrett's esophagus with some evidence of intramucosal adenocarcinoma of the esophagus. 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 Also had c diff colitis - treated   Current Treatment  1) Active surveillance 2) Xgeva 167m Lindstrom q4weeks for bone metastases.  Previous Treatment  1 Palliative radiation therapy to the large left ilium metastases 2. IV Nivolumab x 20ycles (discontinued due to likely immune colitis) 3. Xgeva 1248mSC q4weeks for bone metastases.   HISTORY OF PRESENTING ILLNESS: (plz see my previous consultation for details of initial presentation)  INTERVAL HISTORY  Ms ToIdlers here for her scheduled followup for metastatic lung cancer and post hospitalization f/u after her recent hospitalization for colitis/diverticulitis. Had a flex sigmoidscopy  With biopsy concerning for c diff vs drug induced colitis. She was empirically treated with flagyl and was discharged on PO vancomycin.  She notes no significant diarrhea at this time and her LLQ abdominal discomfort is improving. No other acute new concerns.  MEDICAL HISTORY:  Past Medical History:  Diagnosis Date  . Barrett's esophagus   . Bone neoplasm 06/24/2015  [facility]   metastatic poorly differentiated carcinoma. tumor left groin surgical removal with radiation tx.  . Cataract    BILATERAL  . Cigarette smoker two packs a day or less    Currently still smoking 2 PPD - Not interested in quitting  at this time.  . Colitis 2017  . Colon polyps    hyperplastic, tubular adenomas, tubulovillous adenoma  . Cough, persistent    hx. lung cancer ? primary-being evaluated, unsure of primary site.  . Depression 06/24/2015  . Diverticulosis   . Emphysema of lung ([facility]  . Endometriosis    Hysterectomy with BSO at age [age]rs  . Esophageal adenocarcinoma ([facility]   intramucosal  . Gastritis   . GERD (gastroesophageal reflux disease)   . H/O: pneumonia   . Hiatal hernia   . Hyperlipidemia   . Hypertension 06/24/2015   likely improved incidental to 40 lbs weight loss from her neoplasm. No Longer taking med for this as of 08-06-15  . IBS (irritable bowel syndrome)   . Pain    left hip-persistent"tumor of bone"-radiation tx. 10.  . Vitamin D deficiency disease    SURGICAL HISTORY: Past Surgical History:  Procedure Laterality Date  . BARTHOLIN GLAND CYST EXCISION  5252o ago   Does not want if it was an infected cyst or tumor. Was soon as delivery  . COLONOSCOPY W/ POLYPECTOMY     multiple times - last done 09/2014 per patient.  . ESOPHAGOGASTRODUODENOSCOPY (EGD) WITH PROPOFOL N/A 08/11/2015   Procedure: ESOPHAGOGASTRODUODENOSCOPY (EGD) WITH PROPOFOL;  Surgeon: JaJerene BearsMD;  Location: [facility];  Service: Gastroenterology;  Laterality: N/A;  . FLEXIBLE SIGMOIDOSCOPY N/A 06/24/2017   Procedure: FLEXIBLE SIGMOIDOSCOPY;  Surgeon: ArManus GunningMD;  Location: [facility];  Service: Gastroenterology;  Laterality: N/A;  . GANGLION CYST EXCISION    . KNEE ARTHROSCOPY  age about [age]rs  . TONSILLECTOMY    .  TOTAL ABDOMINAL HYSTERECTOMY W/ BILATERAL SALPINGOOPHORECTOMY  at age 59 yrs   For endometriosis    SOCIAL HISTORY: Social History   Social History  . Marital status: Widowed    Spouse name: N/A  . Number of children: 2  . Years of education: N/A   Occupational History  . Not on file.   Social History Main Topics  . Smoking status: Former Smoker    Packs/day:  1.00    Years: 60.00    Types: Cigarettes    Quit date: 12/06/2015  . Smokeless tobacco: Never Used  . Alcohol use No  . Drug use: No  . Sexual activity: No   Other Topics Concern  . Not on file   Social History Narrative  . No narrative on file    FAMILY HISTORY: Family History  Problem Relation Age of Onset  . Colon cancer Brother   . Colon cancer Brother   . Stroke Mother   . Colon cancer Father   . Breast cancer Daughter 49       ER/PR+ stage II    ALLERGIES:  is allergic to penicillins; remeron [mirtazapine]; and latex. patient wonders if she has a penicillin allergy but notes that she is uncertain about this.  MEDICATIONS:  Current Outpatient Prescriptions  Medication Sig Dispense Refill  . amitriptyline (ELAVIL) 25 MG tablet Take 25 mg by mouth at bedtime.    . calcium-vitamin D (OSCAL WITH D) 500-200 MG-UNIT tablet Take 2 tablets by mouth 3 (three) times daily.    . citalopram (CELEXA) 20 MG tablet Take 1 tablet (20 mg total) by mouth daily. 30 tablet 0  . enoxaparin (LOVENOX) 80 MG/0.8ML injection Inject 0.8 mLs (80 mg total) into the skin daily. 30 Syringe 2  . lidocaine-prilocaine (EMLA) cream Apply small amount over port 1-2 hours prior to treatment, cover with plastic wrap (DO NOT RUB IN). 30 g prn  . LORazepam (ATIVAN) 1 MG tablet Take 1 tablet (1 mg total) by mouth every 8 (eight) hours as needed for anxiety (or nausea). 30 tablet 0  . mesalamine (LIALDA) 1.2 g EC tablet TAKE 4 TABLETS (4.8 G TOTAL) BY MOUTH DAILY WITH BREAKFAST. 120 tablet 0  . omeprazole (PRILOSEC) 40 MG capsule TAKE 1 CAPSULE BY MOUTH EVERY DAY 90 capsule 0  . ondansetron (ZOFRAN) 8 MG tablet Take 1 tablet (8 mg total) by mouth every 8 (eight) hours as needed for nausea or vomiting. 60 tablet 0  . oxyCODONE-acetaminophen (PERCOCET/ROXICET) 5-325 MG tablet Take 1 tablet by mouth every 4 (four) hours as needed for severe pain. 30 tablet 0  . PRESCRIPTION MEDICATION Antibody Plan [facility]    .  vancomycin (VANCOCIN) 125 MG capsule Take 1 capsule (125 mg total) by mouth 4 (four) times daily. 40 capsule 0  . Vitamin D, Ergocalciferol, (DRISDOL) 50000 units CAPS capsule Take 50,000 Units by mouth every 7 (seven) days.    [name] zolpidem (AMBIEN CR) 12.5 MG CR tablet Take 1 tablet (12.5 mg total) by mouth at bedtime as needed for sleep. 30 tablet 0   No current facility-administered medications for this visit.     REVIEW OF SYSTEMS:    10 point review of systems done and was noted to be negative except as noted above.  PHYSICAL EXAMINATION: ECOG PERFORMANCE STATUS: 2 - Symptomatic, <50% confined to bed  Vitals:   06/26/17 1404  BP: (!) 176/66  Pulse: 63  Resp: 18  Temp: 97.9 F (36.6 C)  SpO2: 98%  Filed Weights   06/26/17 1404  Weight: 129 lb 8 oz (58.7 kg)  . Wt Readings from Last 3 Encounters:     05/25/17 126 lb 3.2 oz (57.2 kg)  04/27/17 122 lb (55.3 kg)   GENERAL:alert, NAD, appears well. SKIN: skin color, texture, turgor are normal, no rashes or significant lesions EYES: normal, conjunctiva are pink and non-injected, sclera clear OROPHARYNX:no exudate, no erythema and lips, buccal mucosa, and tongue normal  NECK: supple, thyroid normal size, non-tender, without nodularity LYMPH:  no palpable lymphadenopathy in the cervical, axillary or inguinal LUNGS: clear to auscultation and bilateral reduced breath sounds with normal respiratory effort  HEART: regular rate & rhythm and no murmurs and resolved lower extremity edema ABDOMEN:abdomen soft, non-tender and normal bowel sounds Musculoskeletal: No pedal edema. No calf pain or tenderness.  PSYCH: alert & oriented x 3 with fluent speech NEURO: no focal motor/sensory deficits.  LABORATORY DATA:  I have reviewed the data as listed  . CBC Latest Ref Rng & Units 06/26/2017 06/23/2017 06/22/2017  WBC 3.9 - 10.3 10e3/uL 6.2 11.8(H) 11.7(H)  Hemoglobin 11.6 - 15.9 g/dL 10.8(L) 12.0 11.7(L)  Hematocrit 34.8 - 46.6 % 33.7(L)  36.5 35.6(L)  Platelets 145 - 400 10e3/uL 212 228 225    . CMP Latest Ref Rng & Units 06/26/2017 06/24/2017 06/22/2017  Glucose 70 - 140 mg/dl 115 115(H) 138(H)  BUN 7.0 - 26.0 mg/dL 11._0 Creatinine 0.6 - 1.1 mg/dL 0.8 1.00 0.88  Sodium 136 - 145 mEq/L 138 137 131(L)  Potassium 3.5 - 5.1 mEq/L 4.0 4.3 4.3  Chloride 101 - 111 mmol/L - 104 98(L)  CO2 22 - 29 mEq/L _1 Calcium 8.4 - 10.4 mg/dL 8.8 7.5(L) 8.1(L)  Total Protein 6.4 - 8.3 g/dL 7.3 6.2(L) 7.7  Total Bilirubin 0.20 - 1.20 mg/dL 0.22 0.3 0.8  Alkaline Phos 40 - 150 U/L 75 65 88  AST 5 - 34 U/L _2 ALT 0 - 55 U/L _3 RADIOGRAPHIC STUDIES: CT Abdomen Pelvis W Contrast (Accession [id number]) (Order [id number])  Imaging  Date: 06/22/2017 Department: [place] Released By/Authorizing: Ripley Fraise, MD (auto-released)  Exam Information   Status Exam Begun  Exam Ended   Final [99] 06/23/2017 1:25 AM 06/23/2017 1:36 AM  PACS Images   Show images for CT Abdomen Pelvis W Contrast  Study Result   CLINICAL DATA:  Left lower quadrant pain x2 days with leukocytosis.  EXAM: CT ABDOMEN AND PELVIS WITH CONTRAST  TECHNIQUE: Multidetector CT imaging of the abdomen and pelvis was performed using the standard protocol following bolus administration of intravenous contrast.  CONTRAST:  100 cc Isovue-300  COMPARISON:  05/29/2017  FINDINGS: Lower chest: The included heart is normal in size without pericardial effusion. There is bibasilar atelectasis and/or scarring. No effusion or pneumothorax. Small hiatal hernia.  Hepatobiliary: Physiologic distention of the gallbladder without stones. Scattered stable water attenuating cysts and/or tiny hemangiomata of the liver, the largest are adjacent to the gallbladder fossa measuring approximately 10 and 11 mm diameter. Some are too small to further characterize however. There is no biliary  dilatation.  Pancreas: Atrophic pancreas without inflammation.  Spleen: Normal  Adrenals/Urinary Tract: Normal bilateral adrenal glands. No enhancing renal mass or obstructive uropathy. No nephrolithiasis.  Stomach/Bowel: There is diffuse transmural thickening of the descending colon through rectosigmoid consistent with colitis. No bowel obstruction is seen. A moderate amount of fecal retention is  seen within the ascending and transverse colon. There mild fluid-filled distention of small bowel is noted without obstruction. Stomach is contracted. The appendix is not visualized. No pericecal inflammation is noted however.  Vascular/Lymphatic: Aortoiliac atherosclerosis. No pathologically enlarged lymph nodes by CT size criteria.  Reproductive: Hysterectomy.  No adnexal mass.  Other: No free air nor free fluid.  No hernia.  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 Chronic posterior left lower rib fractures.  IMPRESSION: 1. Colitis of the descending through rectosigmoid colon. 2. Mixed lytic and blastic lesion of the left iliac bone with pathologic fracture, bony expansion and thickening of the cortex is noted as before. Chronic left posterior rib fractures with healing. 3. Incidental findings of hepatic cysts and/or hemangiomata and aortoiliac atherosclerosis.   Electronically Signed   By: Ashley Royalty M.D.   On: 06/23/2017 01:51      ASSESSMENT & PLAN:   #1 Metastatic poorly differentiated carcinoma with likely lung primary [non-small cell lung cancer].  CT of the head with and without contrast showed no evidence of metastatic disease. Patient notes much improved pain control. EGFR blood test mutation analysis negative. Patient's pain is much better  controlled after radiation for the painful ilium met. CT chest abdomen pelvis 04/19/2016 shows no evidence of disease progression. Patient tolerated Nivolumab very well but was discontinued when she developed grade 2 Immune colitis . Has been off Nivolumab for >6 months  CT chest abdomen pelvis on 06/24/2016 shows no evidence of new disease or progression of metastatic disease. CT chest abdomen pelvis 09/06/2016 shows 1. Mixed interval response to therapy. 2. There is a new left ventral chest wall lesion deep to the pectoralis musculature worrisome for metastatic disease. 3. Posterior lower lobe nodular densities are identified which may reflect areas of pulmonary metastasis. 4. Interval decrease in size of destructive lesion involving the left iliac bone.  CT chest abd pelvis 12/08/2016: Cystic mass involving the left ventral chest wall has resolved in the interval. Likely was a hematoma due to trauma. Interval increase in size of pleural base mass overlying the posterior and inferior left lower lobe. There is also a new left pleural effusion identified.  CT chest 02/01/2017: Residual irregular soft tissue thickening/volume loss and trace left pleural fluid at the base of the left hemithorax, overall improved in appearance from 12/08/2016. No measurable lesion.  CT chest 05/29/2017 shows -No residual pleural based mass or significant pleural effusion in the left hemithorax. No evidence of thoracic metastatic disease. No evidence of progressive metastatic disease within the abdomen or pelvis. Mixed lytic and blastic lesion involving the left iliac bone and associated pathologic fracture are unchanged.   PLAN -No symptoms suggestive of lung cance progression at this time. -recent hospitalization with colitis -- likely c diff -- treated with flagyl  And then discharged on po vancomycin -We shall continue her Xgeva every 4 weeks    #2 diarrhea-  now resolved was previously. grade 2 likely related to immune  colitis from her Nivolumaband also had c diff colitis (s/p vancomycin) and possible underlying IBD She is currently on Lialda, budesonide,probiotics and lomotil (not using her lomotil much) #3 Concern for possible diverticulitis on CT and now again with possible c dff colitis Plan  -on rx for c diff based on findings on flexsigmoidoscopy.  #4 DVT and PE  -will need to be on  ongoing Lovenox therapy -Monitor for bleeding .  #5 Barrett's esophagus 4cms in the distal esophagus with low and high-grade dysplasia cannot rule out an early intra-mucosal esophageal adenocarcinoma. Plan -Patient being monitored by Dr. [name] from GI. -patient notes that she does not want to f/u at [facility] for further intervention as per GI plan.  -Continue Xgeva q4weeks -continue Sandostatin q4weeks RTC with Dr Irene Limbo in 2 months with labs   All questions were answered. The patient knows to call the clinic with any problems, questions or concerns.  Sullivan Lone MD [place] Hematology/Oncology Physician [facility]  (Office): [phone] (Work cell): [phone] (Fax): [phone]

## 2017-07-24 ENCOUNTER — Ambulatory Visit (HOSPITAL_BASED_OUTPATIENT_CLINIC_OR_DEPARTMENT_OTHER): Payer: Medicare Other

## 2017-07-24 ENCOUNTER — Other Ambulatory Visit (HOSPITAL_BASED_OUTPATIENT_CLINIC_OR_DEPARTMENT_OTHER): Payer: Medicare Other

## 2017-07-24 ENCOUNTER — Other Ambulatory Visit: Payer: Self-pay | Admitting: *Deleted

## 2017-07-24 VITALS — BP 144/79 | HR 60 | Temp 97.9°F | Resp 18

## 2017-07-24 VITALS — BP 158/70 | HR 63 | Temp 98.2°F | Resp 17

## 2017-07-24 DIAGNOSIS — C3491 Malignant neoplasm of unspecified part of right bronchus or lung: Secondary | ICD-10-CM

## 2017-07-24 DIAGNOSIS — C3490 Malignant neoplasm of unspecified part of unspecified bronchus or lung: Secondary | ICD-10-CM

## 2017-07-24 DIAGNOSIS — Z95828 Presence of other vascular implants and grafts: Secondary | ICD-10-CM

## 2017-07-24 LAB — COMPREHENSIVE METABOLIC PANEL
ALBUMIN: 3.2 g/dL — AB (ref 3.5–5.0)
ALK PHOS: 81 U/L (ref 40–150)
ALT: 15 U/L (ref 0–55)
ANION GAP: 6 meq/L (ref 3–11)
AST: 20 U/L (ref 5–34)
BILIRUBIN TOTAL: 0.28 mg/dL (ref 0.20–1.20)
BUN: 23.5 mg/dL (ref 7.0–26.0)
CO2: 24 mEq/L (ref 22–29)
Calcium: 9.5 mg/dL (ref 8.4–10.4)
Chloride: 110 mEq/L — ABNORMAL HIGH (ref 98–109)
Creatinine: 1 mg/dL (ref 0.6–1.1)
EGFR: 57 mL/min/{1.73_m2} — AB (ref >90)
Glucose: 78 mg/dl (ref 70–140)
POTASSIUM: 3.8 meq/L (ref 3.5–5.1)
SODIUM: 140 meq/L (ref 136–145)
Total Protein: 7.8 g/dL (ref 6.4–8.3)

## 2017-07-24 LAB — CBC WITH DIFFERENTIAL/PLATELET
BASO%: 1.2 % (ref 0.0–2.0)
BASOS ABS: 0.1 10*3/uL (ref 0.0–0.1)
EOS%: 1.8 % (ref 0.0–7.0)
Eosinophils Absolute: 0.1 10*3/uL (ref 0.0–0.5)
HCT: 36.7 % (ref 34.8–46.6)
HEMOGLOBIN: 11.8 g/dL (ref 11.6–15.9)
LYMPH%: 20.8 % (ref 14.0–49.7)
MCH: 28.7 pg (ref 25.1–34.0)
MCHC: 32.3 g/dL (ref 31.5–36.0)
MCV: 89.1 fL (ref 79.5–101.0)
MONO#: 0.5 10*3/uL (ref 0.1–0.9)
MONO%: 7.4 % (ref 0.0–14.0)
NEUT#: 5.1 10*3/uL (ref 1.5–6.5)
NEUT%: 68.8 % (ref 38.4–76.8)
Platelets: 163 10*3/uL (ref 145–400)
RBC: 4.12 10*6/uL (ref 3.70–5.45)
RDW: 15.7 % — AB (ref 11.2–14.5)
WBC: 7.4 10*3/uL (ref 3.9–10.3)
lymph#: 1.5 10*3/uL (ref 0.9–3.3)

## 2017-07-24 LAB — MAGNESIUM: MAGNESIUM: 1.8 mg/dL (ref 1.5–2.5)

## 2017-07-24 MED ORDER — DENOSUMAB 120 MG/1.7ML ~~LOC~~ SOLN
120.0000 mg | Freq: Once | SUBCUTANEOUS | Status: DC
  Filled 2017-07-24: qty 1.7

## 2017-07-24 MED ORDER — HEPARIN SOD (PORK) LOCK FLUSH 100 UNIT/ML IV SOLN
500.0000 [IU] | Freq: Once | INTRAVENOUS | Status: AC
  Administered 2017-07-24: 500 [IU] via INTRAVENOUS
  Filled 2017-07-24: qty 5

## 2017-07-24 MED ORDER — SODIUM CHLORIDE 0.9% FLUSH
10.0000 mL | INTRAVENOUS | Status: DC | PRN
  Administered 2017-07-24: 10 mL via INTRAVENOUS
  Filled 2017-07-24: qty 10

## 2017-07-24 MED ORDER — OCTREOTIDE ACETATE 30 MG IM KIT
30.0000 mg | PACK | Freq: Once | INTRAMUSCULAR | Status: AC
  Administered 2017-07-24: 30 mg via INTRAMUSCULAR
  Filled 2017-07-24: qty 1

## 2017-07-24 NOTE — Patient Instructions (Signed)
Octreotide injection solution What is this medicine? OCTREOTIDE (ok TREE oh tide) is used to reduce blood levels of growth hormone in patients with a condition called acromegaly. This medicine also reduces flushing and watery diarrhea caused by certain types of cancer. This medicine may be used for other purposes; ask your health care provider or pharmacist if you have questions. COMMON BRAND NAME(S): Sandostatin, Sandostatin LAR What should I tell my health care provider before I take this medicine? They need to know if you have any of these conditions: -gallbladder disease -kidney disease -liver disease -an unusual or allergic reaction to octreotide, other medicines, foods, dyes, or preservatives -pregnant or trying to get pregnant -breast-feeding How should I use this medicine? This medicine is for injection under the skin or into a vein (only in emergency situations). It is usually given by a health care professional in a hospital or clinic setting. If you get this medicine at home, you will be taught how to prepare and give this medicine. Allow the injection solution to come to room temperature before use. Do not warm it artificially. Use exactly as directed. Take your medicine at regular intervals. Do not take your medicine more often than directed. It is important that you put your used needles and syringes in a special sharps container. Do not put them in a trash can. If you do not have a sharps container, call your pharmacist or healthcare provider to get one. Talk to your pediatrician regarding the use of this medicine in children. Special care may be needed. Overdosage: If you think you have taken too much of this medicine contact a poison control center or emergency room at once. NOTE: This medicine is only for you. Do not share this medicine with others. What if I miss a dose? If you miss a dose, take it as soon as you can. If it is almost time for your next dose, take only that  dose. Do not take double or extra doses. What may interact with this medicine? Do not take this medicine with any of the following medications: -cisapride -droperidol -general anesthetics -grepafloxacin -perphenazine -thioridazine This medicine may also interact with the following medications: -bromocriptine -cyclosporine -diuretics -medicines for blood pressure, heart disease, irregular heart beat -medicines for diabetes, including insulin -quinidine This list may not describe all possible interactions. Give your health care provider a list of all the medicines, herbs, non-prescription drugs, or dietary supplements you use. Also tell them if you smoke, drink alcohol, or use illegal drugs. Some items may interact with your medicine. What should I watch for while using this medicine? Visit your doctor or health care professional for regular checks on your progress. To help reduce irritation at the injection site, use a different site for each injection and make sure the solution is at room temperature before use. This medicine may cause increases or decreases in blood sugar. Signs of high blood sugar include frequent urination, unusual thirst, flushed or dry skin, difficulty breathing, drowsiness, stomach ache, nausea, vomiting or dry mouth. Signs of low blood sugar include chills, cool, pale skin or cold sweats, drowsiness, extreme hunger, fast heartbeat, headache, nausea, nervousness or anxiety, shakiness, trembling, unsteadiness, tiredness, or weakness. Contact your doctor or health care professional right away if you experience any of these symptoms. What side effects may I notice from receiving this medicine? Side effects that you should report to your doctor or health care professional as soon as possible: -allergic reactions like skin rash, itching or hives, swelling   of the face, lips, or tongue -changes in blood sugar -changes in heart rate -severe stomach pain Side effects that  usually do not require medical attention (report to your doctor or health care professional if they continue or are bothersome): -diarrhea or constipation -gas or stomach pain -nausea, vomiting -pain, redness, swelling and irritation at site where injected This list may not describe all possible side effects. Call your doctor for medical advice about side effects. You may report side effects to FDA at 1-800-FDA-1088. Where should I keep my medicine? Keep out of the reach of children. Store in a refrigerator between 2 and 8 degrees C (36 and 46 degrees F). Protect from light. Allow to come to room temperature naturally. Do not use artificial heat. If protected from light, the injection may be stored at room temperature between 20 and 30 degrees C (70 and 86 degrees F) for 14 days. After the initial use, throw away any unused portion of a multiple dose vial after 14 days. Throw away unused portions of the ampules after use. NOTE: This sheet is a summary. It may not cover all possible information. If you have questions about this medicine, talk to your doctor, pharmacist, or health care provider.  2018 Elsevier/Gold Standard (2008-06-17 16:56:04)  Denosumab injection What is this medicine? DENOSUMAB (den oh sue mab) slows bone breakdown. Prolia is used to treat osteoporosis in women after menopause and in men. Delton See is used to treat a high calcium level due to cancer and to prevent bone fractures and other bone problems caused by multiple myeloma or cancer bone metastases. Delton See is also used to treat giant cell tumor of the bone. This medicine may be used for other purposes; ask your health care provider or pharmacist if you have questions. COMMON BRAND NAME(S): Prolia, XGEVA What should I tell my health care provider before I take this medicine? They need to know if you have any of these conditions: -dental disease -having surgery or tooth extraction -infection -kidney disease -low levels of  calcium or Vitamin D in the blood -malnutrition -on hemodialysis -skin conditions or sensitivity -thyroid or parathyroid disease -an unusual reaction to denosumab, other medicines, foods, dyes, or preservatives -pregnant or trying to get pregnant -breast-feeding How should I use this medicine? This medicine is for injection under the skin. It is given by a health care professional in a hospital or clinic setting. If you are getting Prolia, a special MedGuide will be given to you by the pharmacist with each prescription and refill. Be sure to read this information carefully each time. For Prolia, talk to your pediatrician regarding the use of this medicine in children. Special care may be needed. For Delton See, talk to your pediatrician regarding the use of this medicine in children. While this drug may be prescribed for children as young as 13 years for selected conditions, precautions do apply. Overdosage: If you think you have taken too much of this medicine contact a poison control center or emergency room at once. NOTE: This medicine is only for you. Do not share this medicine with others. What if I miss a dose? It is important not to miss your dose. Call your doctor or health care professional if you are unable to keep an appointment. What may interact with this medicine? Do not take this medicine with any of the following medications: -other medicines containing denosumab This medicine may also interact with the following medications: -medicines that lower your chance of fighting infection -steroid medicines like prednisone  or cortisone This list may not describe all possible interactions. Give your health care provider a list of all the medicines, herbs, non-prescription drugs, or dietary supplements you use. Also tell them if you smoke, drink alcohol, or use illegal drugs. Some items may interact with your medicine. What should I watch for while using this medicine? Visit your doctor or  health care professional for regular checks on your progress. Your doctor or health care professional may order blood tests and other tests to see how you are doing. Call your doctor or health care professional for advice if you get a fever, chills or sore throat, or other symptoms of a cold or flu. Do not treat yourself. This drug may decrease your body's ability to fight infection. Try to avoid being around people who are sick. You should make sure you get enough calcium and vitamin D while you are taking this medicine, unless your doctor tells you not to. Discuss the foods you eat and the vitamins you take with your health care professional. See your dentist regularly. Brush and floss your teeth as directed. Before you have any dental work done, tell your dentist you are receiving this medicine. Do not become pregnant while taking this medicine or for 5 months after stopping it. Talk with your doctor or health care professional about your birth control options while taking this medicine. Women should inform their doctor if they wish to become pregnant or think they might be pregnant. There is a potential for serious side effects to an unborn child. Talk to your health care professional or pharmacist for more information. What side effects may I notice from receiving this medicine? Side effects that you should report to your doctor or health care professional as soon as possible: -allergic reactions like skin rash, itching or hives, swelling of the face, lips, or tongue -bone pain -breathing problems -dizziness -jaw pain, especially after dental work -redness, blistering, peeling of the skin -signs and symptoms of infection like fever or chills; cough; sore throat; pain or trouble passing urine -signs of low calcium like fast heartbeat, muscle cramps or muscle pain; pain, tingling, numbness in the hands or feet; seizures -unusual bleeding or bruising -unusually weak or tired Side effects that  usually do not require medical attention (report to your doctor or health care professional if they continue or are bothersome): -constipation -diarrhea -headache -joint pain -loss of appetite -muscle pain -runny nose -tiredness -upset stomach This list may not describe all possible side effects. Call your doctor for medical advice about side effects. You may report side effects to FDA at 1-800-FDA-1088. Where should I keep my medicine? This medicine is only given in a clinic, doctor's office, or other health care setting and will not be stored at home. NOTE: This sheet is a summary. It may not cover all possible information. If you have questions about this medicine, talk to your doctor, pharmacist, or health care provider.  2018 Elsevier/Gold Standard (2016-12-13 19:17:21)   

## 2017-07-28 ENCOUNTER — Other Ambulatory Visit: Payer: Self-pay | Admitting: Hematology

## 2017-08-21 ENCOUNTER — Other Ambulatory Visit (HOSPITAL_BASED_OUTPATIENT_CLINIC_OR_DEPARTMENT_OTHER): Payer: Medicare Other

## 2017-08-21 ENCOUNTER — Encounter: Payer: Self-pay | Admitting: Hematology

## 2017-08-21 ENCOUNTER — Telehealth: Payer: Self-pay | Admitting: Hematology

## 2017-08-21 ENCOUNTER — Ambulatory Visit (HOSPITAL_BASED_OUTPATIENT_CLINIC_OR_DEPARTMENT_OTHER): Payer: Medicare Other

## 2017-08-21 ENCOUNTER — Ambulatory Visit (HOSPITAL_BASED_OUTPATIENT_CLINIC_OR_DEPARTMENT_OTHER): Payer: Medicare Other | Admitting: Hematology

## 2017-08-21 VITALS — BP 169/80 | HR 71 | Temp 98.0°F | Resp 18 | Ht 68.0 in | Wt 139.7 lb

## 2017-08-21 DIAGNOSIS — C3491 Malignant neoplasm of unspecified part of right bronchus or lung: Secondary | ICD-10-CM

## 2017-08-21 DIAGNOSIS — Z86711 Personal history of pulmonary embolism: Secondary | ICD-10-CM | POA: Diagnosis not present

## 2017-08-21 DIAGNOSIS — M25511 Pain in right shoulder: Secondary | ICD-10-CM | POA: Diagnosis not present

## 2017-08-21 DIAGNOSIS — I2699 Other pulmonary embolism without acute cor pulmonale: Secondary | ICD-10-CM

## 2017-08-21 DIAGNOSIS — R945 Abnormal results of liver function studies: Secondary | ICD-10-CM

## 2017-08-21 DIAGNOSIS — Z86718 Personal history of other venous thrombosis and embolism: Secondary | ICD-10-CM

## 2017-08-21 DIAGNOSIS — Z95828 Presence of other vascular implants and grafts: Secondary | ICD-10-CM

## 2017-08-21 DIAGNOSIS — K7689 Other specified diseases of liver: Secondary | ICD-10-CM | POA: Diagnosis not present

## 2017-08-21 DIAGNOSIS — Z7901 Long term (current) use of anticoagulants: Secondary | ICD-10-CM

## 2017-08-21 DIAGNOSIS — C7951 Secondary malignant neoplasm of bone: Secondary | ICD-10-CM

## 2017-08-21 DIAGNOSIS — C3490 Malignant neoplasm of unspecified part of unspecified bronchus or lung: Secondary | ICD-10-CM

## 2017-08-21 DIAGNOSIS — M25552 Pain in left hip: Secondary | ICD-10-CM | POA: Diagnosis not present

## 2017-08-21 DIAGNOSIS — R197 Diarrhea, unspecified: Secondary | ICD-10-CM

## 2017-08-21 LAB — COMPREHENSIVE METABOLIC PANEL
ALBUMIN: 3.1 g/dL — AB (ref 3.5–5.0)
ALK PHOS: 160 U/L — AB (ref 40–150)
ALT: 76 U/L — ABNORMAL HIGH (ref 0–55)
AST: 64 U/L — AB (ref 5–34)
Anion Gap: 8 mEq/L (ref 3–11)
BUN: 17.8 mg/dL (ref 7.0–26.0)
CHLORIDE: 106 meq/L (ref 98–109)
CO2: 23 meq/L (ref 22–29)
Calcium: 8.9 mg/dL (ref 8.4–10.4)
Creatinine: 0.8 mg/dL (ref 0.6–1.1)
EGFR: 67 mL/min/{1.73_m2} — ABNORMAL LOW (ref >90)
GLUCOSE: 105 mg/dL (ref 70–140)
POTASSIUM: 3.9 meq/L (ref 3.5–5.1)
SODIUM: 137 meq/L (ref 136–145)
Total Bilirubin: 0.27 mg/dL (ref 0.20–1.20)
Total Protein: 7.7 g/dL (ref 6.4–8.3)

## 2017-08-21 LAB — CBC & DIFF AND RETIC
BASO%: 0.3 % (ref 0.0–2.0)
Basophils Absolute: 0 10*3/uL (ref 0.0–0.1)
EOS ABS: 0.1 10*3/uL (ref 0.0–0.5)
EOS%: 1.7 % (ref 0.0–7.0)
HCT: 35.8 % (ref 34.8–46.6)
HEMOGLOBIN: 11.2 g/dL — AB (ref 11.6–15.9)
IMMATURE RETIC FRACT: 12.5 % — AB (ref 1.60–10.00)
LYMPH%: 23.6 % (ref 14.0–49.7)
MCH: 28.7 pg (ref 25.1–34.0)
MCHC: 31.3 g/dL — ABNORMAL LOW (ref 31.5–36.0)
MCV: 91.8 fL (ref 79.5–101.0)
MONO#: 0.5 10*3/uL (ref 0.1–0.9)
MONO%: 7.6 % (ref 0.0–14.0)
NEUT%: 66.8 % (ref 38.4–76.8)
NEUTROS ABS: 4.3 10*3/uL (ref 1.5–6.5)
Platelets: 169 10*3/uL (ref 145–400)
RBC: 3.9 10*6/uL (ref 3.70–5.45)
RDW: 15.2 % — AB (ref 11.2–14.5)
RETIC %: 1.95 % (ref 0.70–2.10)
Retic Ct Abs: 76.05 10*3/uL (ref 33.70–90.70)
WBC: 6.4 10*3/uL (ref 3.9–10.3)
lymph#: 1.5 10*3/uL (ref 0.9–3.3)

## 2017-08-21 MED ORDER — OCTREOTIDE ACETATE 30 MG IM KIT
30.0000 mg | PACK | Freq: Once | INTRAMUSCULAR | Status: AC
  Administered 2017-08-21: 30 mg via INTRAMUSCULAR
  Filled 2017-08-21: qty 1

## 2017-08-21 MED ORDER — DENOSUMAB 120 MG/1.7ML ~~LOC~~ SOLN
120.0000 mg | Freq: Once | SUBCUTANEOUS | Status: AC
Start: 2017-08-21 — End: 2017-08-21
  Administered 2017-08-21: 120 mg via SUBCUTANEOUS
  Filled 2017-08-21: qty 1.7

## 2017-08-21 MED ORDER — HEPARIN SOD (PORK) LOCK FLUSH 100 UNIT/ML IV SOLN
500.0000 [IU] | Freq: Once | INTRAVENOUS | Status: AC
  Administered 2017-08-21: 500 [IU]
  Filled 2017-08-21: qty 5

## 2017-08-21 MED ORDER — SODIUM CHLORIDE 0.9% FLUSH
10.0000 mL | Freq: Once | INTRAVENOUS | Status: AC
  Administered 2017-08-21: 10 mL
  Filled 2017-08-21: qty 10

## 2017-08-21 NOTE — Telephone Encounter (Signed)
Scheduled appt per 9/17 los - Gave patient AVS and calender per los.

## 2017-08-21 NOTE — Patient Instructions (Signed)
Thank you for choosing [facility] to provide your oncology and hematology care.  To afford each patient quality time with our providers, please arrive 30 minutes before your scheduled appointment time.  If you arrive late for your appointment, you may be asked to reschedule.  We strive to give you quality time with our providers, and arriving late affects you and other patients whose appointments are after yours.   If you are a no show for multiple scheduled visits, you may be dismissed from the clinic at the providers discretion.    Again, thank you for choosing [facility], our hope is that these requests will decrease the amount of time that you wait before being seen by our physicians.  ______________________________________________________________________  Should you have questions after your visit to the [facility], please contact our office at [phone] between the hours of 8:30 and 4:30 p.m.    Voicemails left after 4:30p.m will not be returned until the following business day.    For prescription refill requests, please have your pharmacy contact us directly.  Please also try to allow 48 hours for prescription requests.    Please contact the scheduling department for questions regarding scheduling.  For scheduling of procedures such as PET scans, CT scans, MRI, Ultrasound, etc please contact central scheduling at [phone].    Resources For Cancer Patients and Caregivers:   Oncolink.org:  A wonderful resource for patients and healthcare providers for information regarding your disease, ways to tract your treatment, what to expect, etc.     American Cancer Society:  800-227-2345  Can help patients locate various types of support and financial assistance  Cancer Care: 1-800-813-HOPE (4673) Provides financial assistance, online support groups, medication/co-pay assistance.    [place] DSS:  336-641-3447 Where to apply for food  stamps, Medicaid, and utility assistance  Medicare Rights Center: 800-333-4114 Helps people with Medicare understand their rights and benefits, navigate the Medicare system, and secure the quality healthcare they deserve  SCAT: 336-333-6589 [place] [employer]'s shared-ride transportation service for eligible riders who have a disability that prevents them from riding the fixed route bus.    For additional information on assistance programs please contact our social worker:   Grier Hock/Abigail Elmore:  [phone]            

## 2017-08-21 NOTE — Progress Notes (Signed)
[name]    HEMATOLOGY ONCOLOGY PROGRESS NOTE  Date of service: .08/21/2017  Patient Care Team: Tamsen Roers, MD as PCP - General (Family Medicine) [name], MD as Consulting Physician (Hematology and Oncology)  CHIEF COMPLAINTS/PURPOSE OF CONSULTATION: Follow-up for metastatic lung cancer  DIAGNOSIS:   #1 Metastatic non-small cell lung cancer with bilateral lung nodules and large metastatic lesion in the left Ilium. #2 Barrett's esophagus with some evidence of intramucosal adenocarcinoma of the esophagus. 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 Also had c diff colitis - treated   Current Treatment  1) Active surveillance 2) Xgeva 133m [place] q4weeks for bone metastases.  Previous Treatment  1 Palliative radiation therapy to the large left ilium metastases 2. IV Nivolumab x 20ycles (discontinued due to likely immune colitis) 3. Xgeva 1246mSC q4weeks for bone metastases.   HISTORY OF PRESENTING ILLNESS: (plz see my previous consultation for details of initial presentation)  INTERVAL HISTORY  Cindy Byrd here for her scheduled followup for metastatic lung cancer. She notes that she has been feeling well overall and has been eating well and has appropriately gained weight. Notes that she overall feels well but has some increased pain in her left hip and also notes some new pain in her right shoulder. No further abdominal pain. Notes that she has not had any diarrhea. No other acute new concerns.   MEDICAL HISTORY:  Past Medical History:  Diagnosis Date  . Barrett's esophagus   . Bone neoplasm 06/24/2015  . [facility]   metastatic poorly differentiated carcinoma. tumor left groin surgical removal with radiation tx.  . Cataract    BILATERAL  . Cigarette smoker two packs a day or less    Currently still smoking 2 PPD - Not interested in quitting at this time.  . Colitis 2017  .  Colon polyps    hyperplastic, tubular adenomas, tubulovillous adenoma  . Cough, persistent    hx. lung cancer ? primary-being evaluated, unsure of primary site.  . Depression 06/24/2015  . Diverticulosis   . [place] lung [place]  . Endometriosis    Hysterectomy with BSO at age [age]rs  . [place]   intramucosal  . Gastritis   . GERD (gastroesophageal reflux disease)   . H/O: pneumonia   . Hiatal hernia   . Hyperlipidemia   . Hypertension 06/24/2015   likely improved incidental to 40 lbs weight loss from her neoplasm. No Longer taking med for this as of 08-06-15  . IBS (irritable bowel syndrome)   . Pain    left hip-persistent"tumor of bone"-radiation tx. 10.  . Vitamin D deficiency disease    SURGICAL HISTORY: Past Surgical History:  Procedure Laterality Date  . BARTHOLIN GLAND CYST EXCISION  5229o ago   Does not want if it was an infected cyst or tumor. Was soon as delivery  . COLONOSCOPY W/ POLYPECTOMY     multiple times - last done 09/2014 per patient.  . ESOPHAGOGASTRODUODENOSCOPY (EGD) WITH PROPOFOL N/A 08/11/2015   Procedure: ESOPHAGOGASTRODUODENOSCOPY (EGD) WITH PROPOFOL;  Surgeon: JaJerene BearsMD;  Location: [facility];  Service: Gastroenterology;  Laterality: N/A;  . FLEXIBLE SIGMOIDOSCOPY N/A 06/24/2017   Procedure: FLEXIBLE SIGMOIDOSCOPY;  Surgeon: ArManus GunningMD;  Location: [facility];  Service: Gastroenterology;  Laterality: N/A;  . GANGLION CYST EXCISION    . KNEE ARTHROSCOPY  age about [age]rs  . TONSILLECTOMY    .  TOTAL ABDOMINAL HYSTERECTOMY W/ BILATERAL SALPINGOOPHORECTOMY  at age 49 yrs   For endometriosis    SOCIAL HISTORY: Social History   Social History  . Marital status: Widowed    Spouse name: N/A  . Number of children: 2  . Years of education: N/A   Occupational History  . Not on file.   Social History Main Topics  . Smoking status: Former Smoker    Packs/day: 1.00    Years: 60.00    Types:  Cigarettes    Quit date: 12/06/2015  . Smokeless tobacco: Never Used  . Alcohol use No  . Drug use: No  . Sexual activity: No   Other Topics Concern  . Not on file   Social History Narrative  . No narrative on file    FAMILY HISTORY: Family History  Problem Relation Age of Onset  . Colon cancer Brother   . Colon cancer Brother   . Stroke Mother   . Colon cancer Father   . Breast cancer Daughter 6       ER/PR+ stage II    ALLERGIES:  is allergic to penicillins; remeron [mirtazapine]; and latex. patient wonders if she has a penicillin allergy but notes that she is uncertain about this.  MEDICATIONS:  Current Outpatient Prescriptions  Medication Sig Dispense Refill  . amitriptyline (ELAVIL) 25 MG tablet Take 25 mg by mouth at bedtime.    . calcium-vitamin D (OSCAL WITH D) 500-200 MG-UNIT tablet Take 2 tablets by mouth 3 (three) times daily.    . citalopram (CELEXA) 20 MG tablet Take 1 tablet (20 mg total) by mouth daily. 30 tablet 0  . enoxaparin (LOVENOX) 80 MG/0.8ML injection Inject 0.8 mLs (80 mg total) into the skin daily. 30 Syringe 2  . lidocaine-prilocaine (EMLA) cream Apply small amount over port 1-2 hours prior to treatment, cover with plastic wrap (DO NOT RUB IN). 30 g prn  . LORazepam (ATIVAN) 1 MG tablet Take 1 tablet (1 mg total) by mouth every 8 (eight) hours as needed for anxiety (or nausea). 30 tablet 0  . omeprazole (PRILOSEC) 40 MG capsule TAKE 1 CAPSULE BY MOUTH EVERY DAY 90 capsule 0  . ondansetron (ZOFRAN) 8 MG tablet Take 1 tablet (8 mg total) by mouth every 8 (eight) hours as needed for nausea or vomiting. 60 tablet 0  . oxyCODONE-acetaminophen (PERCOCET/ROXICET) 5-325 MG tablet Take 1 tablet by mouth every 4 (four) hours as needed for severe pain. 30 tablet 0  . Vitamin D, Ergocalciferol, (DRISDOL) 50000 units CAPS capsule Take 50,000 Units by mouth every 7 (seven) days.    . mesalamine (LIALDA) 1.2 g EC tablet TAKE 4 TABLETS (4.8 G TOTAL) BY MOUTH DAILY  WITH BREAKFAST. (Patient not taking: Reported on 08/21/2017) 120 tablet 0   No current facility-administered medications for this visit.     REVIEW OF SYSTEMS:    10 point review of systems done and was noted to be negative except as noted above.  PHYSICAL EXAMINATION: ECOG PERFORMANCE STATUS: 2 - Symptomatic, <50% confined to bed  Vitals:   08/21/17 1204  BP: (!) 169/80  Pulse: 71  Resp: 18  Temp: 98 F (36.7 C)  SpO2: 100%   Filed Weights   08/21/17 1204  Weight: 139 lb 11.2 oz (63.4 kg)  . Wt Readings from Last 3 Encounters:     05/25/17 126 lb 3.2 oz (57.2 kg)  04/27/17 122 lb (55.3 kg)   GENERAL:alert, NAD, appears well. SKIN: skin color, texture, turgor are  normal, no rashes or significant lesions EYES: normal, conjunctiva are pink and non-injected, sclera clear OROPHARYNX:no exudate, no erythema and lips, buccal mucosa, and tongue normal  NECK: supple, thyroid normal size, non-tender, without nodularity LYMPH:  no palpable lymphadenopathy in the cervical, axillary or inguinal LUNGS: clear to auscultation and bilateral reduced breath sounds with normal respiratory effort  HEART: regular rate & rhythm and no murmurs and resolved lower extremity edema ABDOMEN:abdomen soft, non-tender and normal bowel sounds Musculoskeletal: No pedal edema. No calf pain or tenderness.  PSYCH: alert & oriented x 3 with fluent speech NEURO: no focal motor/sensory deficits.  LABORATORY DATA:  I have reviewed the data as listed  . CBC Latest Ref Rng & Units 08/21/2017 07/24/2017 06/26/2017  WBC 3.9 - 10.3 10e3/uL 6.4 7.4 6.2  Hemoglobin 11.6 - 15.9 g/dL 11.2(L) 11.8 10.8(L)  Hematocrit 34.8 - 46.6 % 35.8 36.7 33.7(L)  Platelets 145 - 400 10e3/uL 169 163 212    . CMP Latest Ref Rng & Units 08/21/2017 07/24/2017 06/26/2017  Glucose 70 - 140 mg/dl 105 78 115  BUN 7.0 - 26.0 mg/dL 17.8 23.5 11.6  Creatinine 0.6 - 1.1 mg/dL 0.8 1.0 0.8  Sodium 136 - 145 mEq/L 137 140 138  Potassium 3.5  - 5.1 mEq/L 3.9 3.8 4.0  Chloride 101 - 111 mmol/L - - -  CO2 22 - 29 mEq/L 23 24 24   Calcium 8.4 - 10.4 mg/dL 8.9 9.5 8.8  Total Protein 6.4 - 8.3 g/dL 7.7 7.8 7.3  Total Bilirubin 0.20 - 1.20 mg/dL 0.27 0.28 0.22  Alkaline Phos 40 - 150 U/L 160(H) 81 75  AST 5 - 34 U/L 64(H) 20 21  ALT 0 - 55 U/L 76(H) 15 11      RADIOGRAPHIC STUDIES: CT Abdomen Pelvis W Contrast (Accession [id number]) (Order [id number])  Imaging  Date: 06/22/2017 Department: [facility] TELEMETRY/UROLOGY [facility] Released By/Authorizing: Ripley Fraise, MD (auto-released)  Exam Information   Status Exam Begun  Exam Ended   Final [99] 06/23/2017 1:25 AM 06/23/2017 1:36 AM  PACS Images   Show images for CT Abdomen Pelvis W Contrast  Study Result   CLINICAL DATA:  Left lower quadrant pain x2 days with leukocytosis.  EXAM: CT ABDOMEN AND PELVIS WITH CONTRAST  TECHNIQUE: Multidetector CT imaging of the abdomen and pelvis was performed using the standard protocol following bolus administration of intravenous contrast.  CONTRAST:  100 cc Isovue-300  COMPARISON:  05/29/2017  FINDINGS: Lower chest: The included heart is normal in size without pericardial effusion. There is bibasilar atelectasis and/or scarring. No effusion or pneumothorax. Small hiatal hernia.  Hepatobiliary: Physiologic distention of the gallbladder without stones. Scattered stable water attenuating cysts and/or tiny hemangiomata of the liver, the largest are adjacent to the gallbladder fossa measuring approximately 10 and 11 mm diameter. Some are too small to further characterize however. There is no biliary dilatation.  Pancreas: Atrophic pancreas without inflammation.  Spleen: Normal  Adrenals/Urinary Tract: Normal bilateral adrenal glands. No enhancing renal mass or obstructive uropathy. No nephrolithiasis.  Stomach/Bowel: There is diffuse transmural thickening of the descending colon through  rectosigmoid consistent with colitis. No bowel obstruction is seen. A moderate amount of fecal retention is seen within the ascending and transverse colon. There mild fluid-filled distention of small bowel is noted without obstruction. Stomach is contracted. The appendix is not visualized. No pericecal inflammation is noted however.  Vascular/Lymphatic: Aortoiliac atherosclerosis. No pathologically enlarged lymph nodes by CT size criteria.  Reproductive: Hysterectomy.  No adnexal  mass.  Other: No free air nor free fluid.  No hernia.  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 Chronic posterior left lower rib fractures.  IMPRESSION: 1. Colitis of the descending through rectosigmoid colon. 2. Mixed lytic and blastic lesion of the left iliac bone with pathologic fracture, bony expansion and thickening of the cortex is noted as before. Chronic left posterior rib fractures with healing. 3. Incidental findings of hepatic cysts and/or hemangiomata and aortoiliac atherosclerosis.   Electronically Signed   By: Ashley Royalty M.D.   On: 06/23/2017 01:51      ASSESSMENT & PLAN:   #1 Metastatic poorly differentiated carcinoma with likely lung primary [non-small cell lung cancer].  CT of the head with and without contrast showed no evidence of metastatic disease. Patient notes much improved pain control. EGFR blood test mutation analysis negative. Patient's pain is much better controlled after radiation for the painful ilium met. CT chest abdomen pelvis 04/19/2016 shows no evidence of disease progression. Patient tolerated Nivolumab very well but was discontinued when she developed grade 2 Immune colitis . Has been off Nivolumab for >6 months  CT chest abdomen pelvis on 06/24/2016  shows no evidence of new disease or progression of metastatic disease. CT chest abdomen pelvis 09/06/2016 shows 1. Mixed interval response to therapy. 2. There is a new left ventral chest wall lesion deep to the pectoralis musculature worrisome for metastatic disease. 3. Posterior lower lobe nodular densities are identified which may reflect areas of pulmonary metastasis. 4. Interval decrease in size of destructive lesion involving the left iliac bone.  CT chest abd pelvis 12/08/2016: Cystic mass involving the left ventral chest wall has resolved in the interval. Likely was a hematoma due to trauma. Interval increase in size of pleural base mass overlying the posterior and inferior left lower lobe. There is also a new left pleural effusion identified.  CT chest 02/01/2017: Residual irregular soft tissue thickening/volume loss and trace left pleural fluid at the base of the left hemithorax, overall improved in appearance from 12/08/2016. No measurable lesion.  CT chest 05/29/2017 shows -No residual pleural based mass or significant pleural effusion in the left hemithorax. No evidence of thoracic metastatic disease. No evidence of progressive metastatic disease within the abdomen or pelvis. Mixed lytic and blastic lesion involving the left iliac bone and associated pathologic fracture are unchanged.   PLAN -Patient notes some increasing left hip pain and some new right shoulder pain. After discussion she wanted to hold off on any imaging studies for another couple of months. -After the clinic visit her chemistries came back and showed increase in alkaline phosphatase to 160 with some elevation in her transaminases levels. -We will call her and try to reschedule her clinic follow-up and CT scans in 2-3 weeks to evaluate her lab abnormalities and new symptoms. -We shall continue her Xgeva every 4 weeks    #2 diarrhea-  now resolved was previously. grade 2 likely related to immune colitis from her Nivolumaband  also had c diff colitis (s/p vancomycin) and possible underlying IBD She is currently on Lialda, budesonide,probiotics and lomotil (not using her lomotil much) #3 Concern for possible diverticulitis on CT and now again with possible c dff colitis - now resolved Plan  -continue on sandostatin to control diarrhea -continue on Lialda  #4 DVT and PE  -will need to be on ongoing  Lovenox therapy -Monitor for bleeding .  #5 Barrett's esophagus 4cms in the distal esophagus with low and high-grade dysplasia cannot rule out an early intra-mucosal esophageal adenocarcinoma. Plan -Patient being monitored by Dr. [name] from GI. -patient notes that she does not want to f/u at [facility] for further intervention as per GI plan.  -Continue Xgeva q4weeks -continue Sandostatin q4weeks RTC with Dr Irene Limbo in 3-4 weeks with labs and CT chest/abd/pelvis   All questions were answered. The patient knows to call the clinic with any problems, questions or concerns.  Sullivan Lone MD [facility] H[facility]/Oncology Physician [facility]  (Office): [phone] (Work cell): [phone] (Fax): [phone]

## 2017-08-22 ENCOUNTER — Telehealth: Payer: Self-pay | Admitting: *Deleted

## 2017-08-22 NOTE — Telephone Encounter (Signed)
Per staff message, pt notified Dr. Irene Limbo requesting to see back in 1 month rather than 2 due to abnormal lab values.  Pt also notified CT scan should be completed prior to next office visit.  Number to central scheduling provided.  Pt verbalized understanding.  Message sent to scheduling.

## 2017-08-22 NOTE — Telephone Encounter (Signed)
-----   Message from [name], MD sent at 08/21/2017  5:03 PM EDT ----- Regarding: Change in f/u Cindy Byrd, Could you please let [name] know that her CMP results returned after the clinic visit and shows increase in liver function tests and she also has a bump in her alkaline phosphatase which could suggest bone changes. We need to reschedule her follow-up to 4 weeks issue of 8 weeks and also schedule her CT scan of the chest abdomen pelvis in 3-4 weeks instead of 7 weeks to re-evaluate her lung cancer status sooner. Thanks [place]

## 2017-08-23 ENCOUNTER — Telehealth: Payer: Self-pay | Admitting: Hematology

## 2017-08-23 NOTE — Telephone Encounter (Signed)
R/s appt per 9/18 sch message - patient is aware of appt date and time.

## 2017-09-14 ENCOUNTER — Ambulatory Visit (HOSPITAL_COMMUNITY)
Admission: RE | Admit: 2017-09-14 | Discharge: 2017-09-14 | Disposition: A | Discharge status: Home / Self Care | Payer: Medicare Other | Source: Ambulatory Visit | Attending: Hematology | Admitting: Hematology

## 2017-09-14 DIAGNOSIS — K5730 Diverticulosis of large intestine without perforation or abscess without bleeding: Secondary | ICD-10-CM | POA: Insufficient documentation

## 2017-09-14 DIAGNOSIS — J439 Emphysema, unspecified: Secondary | ICD-10-CM | POA: Insufficient documentation

## 2017-09-14 DIAGNOSIS — Z86711 Personal history of pulmonary embolism: Secondary | ICD-10-CM | POA: Insufficient documentation

## 2017-09-14 DIAGNOSIS — I700 Atherosclerosis of aorta: Secondary | ICD-10-CM | POA: Diagnosis not present

## 2017-09-14 DIAGNOSIS — M899 Disorder of bone, unspecified: Secondary | ICD-10-CM | POA: Diagnosis not present

## 2017-09-14 DIAGNOSIS — R911 Solitary pulmonary nodule: Secondary | ICD-10-CM | POA: Insufficient documentation

## 2017-09-14 DIAGNOSIS — C3491 Malignant neoplasm of unspecified part of right bronchus or lung: Secondary | ICD-10-CM | POA: Diagnosis present

## 2017-09-14 MED ORDER — IOPAMIDOL (ISOVUE-300) INJECTION 61%
100.0000 mL | Freq: Once | INTRAVENOUS | Status: AC | PRN
  Administered 2017-09-14: 100 mL via INTRAVENOUS

## 2017-09-14 MED ORDER — IOPAMIDOL (ISOVUE-300) INJECTION 61%
INTRAVENOUS | Status: AC
  Filled 2017-09-14: qty 100
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
## 2017-09-18 ENCOUNTER — Ambulatory Visit (HOSPITAL_BASED_OUTPATIENT_CLINIC_OR_DEPARTMENT_OTHER): Payer: Medicare Other

## 2017-09-18 ENCOUNTER — Ambulatory Visit: Payer: Medicare Other

## 2017-09-18 ENCOUNTER — Other Ambulatory Visit (HOSPITAL_BASED_OUTPATIENT_CLINIC_OR_DEPARTMENT_OTHER): Payer: Medicare Other

## 2017-09-18 ENCOUNTER — Ambulatory Visit (HOSPITAL_BASED_OUTPATIENT_CLINIC_OR_DEPARTMENT_OTHER): Payer: Medicare Other | Admitting: Hematology

## 2017-09-18 VITALS — BP 165/76 | HR 75 | Temp 98.3°F | Resp 17 | Ht 68.0 in | Wt 144.4 lb

## 2017-09-18 DIAGNOSIS — G893 Neoplasm related pain (acute) (chronic): Secondary | ICD-10-CM

## 2017-09-18 DIAGNOSIS — I2699 Other pulmonary embolism without acute cor pulmonale: Secondary | ICD-10-CM

## 2017-09-18 DIAGNOSIS — C3491 Malignant neoplasm of unspecified part of right bronchus or lung: Secondary | ICD-10-CM

## 2017-09-18 DIAGNOSIS — C3490 Malignant neoplasm of unspecified part of unspecified bronchus or lung: Secondary | ICD-10-CM

## 2017-09-18 DIAGNOSIS — R197 Diarrhea, unspecified: Secondary | ICD-10-CM

## 2017-09-18 DIAGNOSIS — Z86711 Personal history of pulmonary embolism: Secondary | ICD-10-CM

## 2017-09-18 DIAGNOSIS — Z86718 Personal history of other venous thrombosis and embolism: Secondary | ICD-10-CM | POA: Diagnosis not present

## 2017-09-18 DIAGNOSIS — C7951 Secondary malignant neoplasm of bone: Secondary | ICD-10-CM

## 2017-09-18 DIAGNOSIS — Z95828 Presence of other vascular implants and grafts: Secondary | ICD-10-CM

## 2017-09-18 DIAGNOSIS — Z7901 Long term (current) use of anticoagulants: Secondary | ICD-10-CM

## 2017-09-18 LAB — COMPREHENSIVE METABOLIC PANEL
ALBUMIN: 3 g/dL — AB (ref 3.5–5.0)
ALK PHOS: 83 U/L (ref 40–150)
ALT: 27 U/L (ref 0–55)
ANION GAP: 6 meq/L (ref 3–11)
AST: 32 U/L (ref 5–34)
BUN: 11.8 mg/dL (ref 7.0–26.0)
CALCIUM: 8.4 mg/dL (ref 8.4–10.4)
CO2: 27 mEq/L (ref 22–29)
Chloride: 107 mEq/L (ref 98–109)
Creatinine: 0.9 mg/dL (ref 0.6–1.1)
Glucose: 100 mg/dl (ref 70–140)
POTASSIUM: 4 meq/L (ref 3.5–5.1)
Sodium: 141 mEq/L (ref 136–145)
Total Bilirubin: 0.25 mg/dL (ref 0.20–1.20)
Total Protein: 7.5 g/dL (ref 6.4–8.3)

## 2017-09-18 LAB — CBC & DIFF AND RETIC
BASO%: 0.5 % (ref 0.0–2.0)
BASOS ABS: 0 10*3/uL (ref 0.0–0.1)
EOS%: 2.1 % (ref 0.0–7.0)
Eosinophils Absolute: 0.1 10*3/uL (ref 0.0–0.5)
HEMATOCRIT: 34.9 % (ref 34.8–46.6)
HEMOGLOBIN: 11 g/dL — AB (ref 11.6–15.9)
Immature Retic Fract: 8.7 % (ref 1.60–10.00)
LYMPH%: 25.5 % (ref 14.0–49.7)
MCH: 28.8 pg (ref 25.1–34.0)
MCHC: 31.5 g/dL (ref 31.5–36.0)
MCV: 91.4 fL (ref 79.5–101.0)
MONO#: 0.4 10*3/uL (ref 0.1–0.9)
MONO%: 6.1 % (ref 0.0–14.0)
NEUT#: 4.1 10*3/uL (ref 1.5–6.5)
NEUT%: 65.8 % (ref 38.4–76.8)
PLATELETS: 182 10*3/uL (ref 145–400)
RBC: 3.82 10*6/uL (ref 3.70–5.45)
RDW: 14.2 % (ref 11.2–14.5)
Retic %: 2.26 % — ABNORMAL HIGH (ref 0.70–2.10)
Retic Ct Abs: 86.33 10*3/uL (ref 33.70–90.70)
WBC: 6.3 10*3/uL (ref 3.9–10.3)
lymph#: 1.6 10*3/uL (ref 0.9–3.3)
nRBC: 0 % (ref 0–0)

## 2017-09-18 LAB — MAGNESIUM: MAGNESIUM: 1.9 mg/dL (ref 1.5–2.5)

## 2017-09-18 MED ORDER — HEPARIN SOD (PORK) LOCK FLUSH 100 UNIT/ML IV SOLN
500.0000 [IU] | Freq: Once | INTRAVENOUS | Status: AC
  Administered 2017-09-18: 500 [IU]
  Filled 2017-09-18: qty 5

## 2017-09-18 MED ORDER — SODIUM CHLORIDE 0.9% FLUSH
10.0000 mL | Freq: Once | INTRAVENOUS | Status: AC
  Administered 2017-09-18: 10 mL
  Filled 2017-09-18: qty 10

## 2017-09-18 MED ORDER — OCTREOTIDE ACETATE 30 MG IM KIT
30.0000 mg | PACK | Freq: Once | INTRAMUSCULAR | Status: AC
  Administered 2017-09-18: 30 mg via INTRAMUSCULAR
  Filled 2017-09-18: qty 1

## 2017-09-18 MED ORDER — DENOSUMAB 120 MG/1.7ML ~~LOC~~ SOLN
120.0000 mg | Freq: Once | SUBCUTANEOUS | Status: AC
  Administered 2017-09-18: 120 mg via SUBCUTANEOUS
  Filled 2017-09-18: qty 1.7

## 2017-10-13 ENCOUNTER — Other Ambulatory Visit: Payer: Self-pay

## 2017-10-13 DIAGNOSIS — C7951 Secondary malignant neoplasm of bone: Secondary | ICD-10-CM

## 2017-10-16 ENCOUNTER — Ambulatory Visit (HOSPITAL_BASED_OUTPATIENT_CLINIC_OR_DEPARTMENT_OTHER): Payer: Medicare Other

## 2017-10-16 ENCOUNTER — Ambulatory Visit: Payer: Medicare Other | Admitting: Hematology

## 2017-10-16 ENCOUNTER — Other Ambulatory Visit: Payer: Medicare Other

## 2017-10-16 ENCOUNTER — Other Ambulatory Visit (HOSPITAL_BASED_OUTPATIENT_CLINIC_OR_DEPARTMENT_OTHER): Payer: Medicare Other

## 2017-10-16 VITALS — BP 179/84 | HR 65 | Temp 98.6°F | Resp 18

## 2017-10-16 DIAGNOSIS — C7951 Secondary malignant neoplasm of bone: Secondary | ICD-10-CM

## 2017-10-16 DIAGNOSIS — C3491 Malignant neoplasm of unspecified part of right bronchus or lung: Secondary | ICD-10-CM

## 2017-10-16 DIAGNOSIS — C3490 Malignant neoplasm of unspecified part of unspecified bronchus or lung: Secondary | ICD-10-CM

## 2017-10-16 DIAGNOSIS — Z95828 Presence of other vascular implants and grafts: Secondary | ICD-10-CM

## 2017-10-16 LAB — COMPREHENSIVE METABOLIC PANEL
ALK PHOS: 72 U/L (ref 40–150)
ALT: 66 U/L — ABNORMAL HIGH (ref 0–55)
AST: 62 U/L — AB (ref 5–34)
Albumin: 3.4 g/dL — ABNORMAL LOW (ref 3.5–5.0)
Anion Gap: 8 mEq/L (ref 3–11)
BUN: 13.4 mg/dL (ref 7.0–26.0)
CHLORIDE: 106 meq/L (ref 98–109)
CO2: 26 meq/L (ref 22–29)
Calcium: 8.8 mg/dL (ref 8.4–10.4)
Creatinine: 0.9 mg/dL (ref 0.6–1.1)
EGFR: 60 mL/min/{1.73_m2} — ABNORMAL LOW (ref >60)
GLUCOSE: 105 mg/dL (ref 70–140)
POTASSIUM: 3.6 meq/L (ref 3.5–5.1)
SODIUM: 141 meq/L (ref 136–145)
Total Bilirubin: 0.31 mg/dL (ref 0.20–1.20)
Total Protein: 8.1 g/dL (ref 6.4–8.3)

## 2017-10-16 LAB — CBC WITH DIFFERENTIAL/PLATELET
BASO%: 0.5 % (ref 0.0–2.0)
BASOS ABS: 0 10*3/uL (ref 0.0–0.1)
EOS ABS: 0.2 10*3/uL (ref 0.0–0.5)
EOS%: 2.1 % (ref 0.0–7.0)
HEMATOCRIT: 37.7 % (ref 34.8–46.6)
HGB: 11.7 g/dL (ref 11.6–15.9)
LYMPH#: 1.4 10*3/uL (ref 0.9–3.3)
LYMPH%: 15.9 % (ref 14.0–49.7)
MCH: 28.5 pg (ref 25.1–34.0)
MCHC: 31 g/dL — AB (ref 31.5–36.0)
MCV: 91.7 fL (ref 79.5–101.0)
MONO#: 0.5 10*3/uL (ref 0.1–0.9)
MONO%: 5.7 % (ref 0.0–14.0)
NEUT#: 6.5 10*3/uL (ref 1.5–6.5)
NEUT%: 75.8 % (ref 38.4–76.8)
PLATELETS: 207 10*3/uL (ref 145–400)
RBC: 4.11 10*6/uL (ref 3.70–5.45)
RDW: 14.1 % (ref 11.2–14.5)
WBC: 8.6 10*3/uL (ref 3.9–10.3)

## 2017-10-16 LAB — MAGNESIUM: MAGNESIUM: 1.7 mg/dL (ref 1.5–2.5)

## 2017-10-16 MED ORDER — OCTREOTIDE ACETATE 30 MG IM KIT
30.0000 mg | PACK | Freq: Once | INTRAMUSCULAR | Status: AC
  Administered 2017-10-16: 30 mg via INTRAMUSCULAR
  Filled 2017-10-16: qty 1

## 2017-10-16 MED ORDER — DENOSUMAB 120 MG/1.7ML ~~LOC~~ SOLN
120.0000 mg | Freq: Once | SUBCUTANEOUS | Status: AC
  Administered 2017-10-16: 120 mg via SUBCUTANEOUS
  Filled 2017-10-16: qty 1.7

## 2017-10-16 NOTE — Patient Instructions (Signed)
Octreotide injection solution What is this medicine? OCTREOTIDE (ok TREE oh tide) is used to reduce blood levels of growth hormone in patients with a condition called acromegaly. This medicine also reduces flushing and watery diarrhea caused by certain types of cancer. This medicine may be used for other purposes; ask your health care provider or pharmacist if you have questions. COMMON BRAND NAME(S): Sandostatin, Sandostatin LAR What should I tell my health care provider before I take this medicine? They need to know if you have any of these conditions: -gallbladder disease -kidney disease -liver disease -an unusual or allergic reaction to octreotide, other medicines, foods, dyes, or preservatives -pregnant or trying to get pregnant -breast-feeding How should I use this medicine? This medicine is for injection under the skin or into a vein (only in emergency situations). It is usually given by a health care professional in a hospital or clinic setting. If you get this medicine at home, you will be taught how to prepare and give this medicine. Allow the injection solution to come to room temperature before use. Do not warm it artificially. Use exactly as directed. Take your medicine at regular intervals. Do not take your medicine more often than directed. It is important that you put your used needles and syringes in a special sharps container. Do not put them in a trash can. If you do not have a sharps container, call your pharmacist or healthcare provider to get one. Talk to your pediatrician regarding the use of this medicine in children. Special care may be needed. Overdosage: If you think you have taken too much of this medicine contact a poison control center or emergency room at once. NOTE: This medicine is only for you. Do not share this medicine with others. What if I miss a dose? If you miss a dose, take it as soon as you can. If it is almost time for your next dose, take only that  dose. Do not take double or extra doses. What may interact with this medicine? Do not take this medicine with any of the following medications: -cisapride -droperidol -general anesthetics -grepafloxacin -perphenazine -thioridazine This medicine may also interact with the following medications: -bromocriptine -cyclosporine -diuretics -medicines for blood pressure, heart disease, irregular heart beat -medicines for diabetes, including insulin -quinidine This list may not describe all possible interactions. Give your health care provider a list of all the medicines, herbs, non-prescription drugs, or dietary supplements you use. Also tell them if you smoke, drink alcohol, or use illegal drugs. Some items may interact with your medicine. What should I watch for while using this medicine? Visit your doctor or health care professional for regular checks on your progress. To help reduce irritation at the injection site, use a different site for each injection and make sure the solution is at room temperature before use. This medicine may cause increases or decreases in blood sugar. Signs of high blood sugar include frequent urination, unusual thirst, flushed or dry skin, difficulty breathing, drowsiness, stomach ache, nausea, vomiting or dry mouth. Signs of low blood sugar include chills, cool, pale skin or cold sweats, drowsiness, extreme hunger, fast heartbeat, headache, nausea, nervousness or anxiety, shakiness, trembling, unsteadiness, tiredness, or weakness. Contact your doctor or health care professional right away if you experience any of these symptoms. What side effects may I notice from receiving this medicine? Side effects that you should report to your doctor or health care professional as soon as possible: -allergic reactions like skin rash, itching or hives, swelling   of the face, lips, or tongue -changes in blood sugar -changes in heart rate -severe stomach pain Side effects that  usually do not require medical attention (report to your doctor or health care professional if they continue or are bothersome): -diarrhea or constipation -gas or stomach pain -nausea, vomiting -pain, redness, swelling and irritation at site where injected This list may not describe all possible side effects. Call your doctor for medical advice about side effects. You may report side effects to FDA at 1-800-FDA-1088. Where should I keep my medicine? Keep out of the reach of children. Store in a refrigerator between 2 and 8 degrees C (36 and 46 degrees F). Protect from light. Allow to come to room temperature naturally. Do not use artificial heat. If protected from light, the injection may be stored at room temperature between 20 and 30 degrees C (70 and 86 degrees F) for 14 days. After the initial use, throw away any unused portion of a multiple dose vial after 14 days. Throw away unused portions of the ampules after use. NOTE: This sheet is a summary. It may not cover all possible information. If you have questions about this medicine, talk to your doctor, pharmacist, or health care provider.  2018 Elsevier/Gold Standard (2008-06-17 16:56:04)  Denosumab injection What is this medicine? DENOSUMAB (den oh sue mab) slows bone breakdown. Prolia is used to treat osteoporosis in women after menopause and in men. Delton See is used to treat a high calcium level due to cancer and to prevent bone fractures and other bone problems caused by multiple myeloma or cancer bone metastases. Delton See is also used to treat giant cell tumor of the bone. This medicine may be used for other purposes; ask your health care provider or pharmacist if you have questions. COMMON BRAND NAME(S): Prolia, XGEVA What should I tell my health care provider before I take this medicine? They need to know if you have any of these conditions: -dental disease -having surgery or tooth extraction -infection -kidney disease -low levels of  calcium or Vitamin D in the blood -malnutrition -on hemodialysis -skin conditions or sensitivity -thyroid or parathyroid disease -an unusual reaction to denosumab, other medicines, foods, dyes, or preservatives -pregnant or trying to get pregnant -breast-feeding How should I use this medicine? This medicine is for injection under the skin. It is given by a health care professional in a hospital or clinic setting. If you are getting Prolia, a special MedGuide will be given to you by the pharmacist with each prescription and refill. Be sure to read this information carefully each time. For Prolia, talk to your pediatrician regarding the use of this medicine in children. Special care may be needed. For Delton See, talk to your pediatrician regarding the use of this medicine in children. While this drug may be prescribed for children as young as 13 years for selected conditions, precautions do apply. Overdosage: If you think you have taken too much of this medicine contact a poison control center or emergency room at once. NOTE: This medicine is only for you. Do not share this medicine with others. What if I miss a dose? It is important not to miss your dose. Call your doctor or health care professional if you are unable to keep an appointment. What may interact with this medicine? Do not take this medicine with any of the following medications: -other medicines containing denosumab This medicine may also interact with the following medications: -medicines that lower your chance of fighting infection -steroid medicines like prednisone  or cortisone This list may not describe all possible interactions. Give your health care provider a list of all the medicines, herbs, non-prescription drugs, or dietary supplements you use. Also tell them if you smoke, drink alcohol, or use illegal drugs. Some items may interact with your medicine. What should I watch for while using this medicine? Visit your doctor or  health care professional for regular checks on your progress. Your doctor or health care professional may order blood tests and other tests to see how you are doing. Call your doctor or health care professional for advice if you get a fever, chills or sore throat, or other symptoms of a cold or flu. Do not treat yourself. This drug may decrease your body's ability to fight infection. Try to avoid being around people who are sick. You should make sure you get enough calcium and vitamin D while you are taking this medicine, unless your doctor tells you not to. Discuss the foods you eat and the vitamins you take with your health care professional. See your dentist regularly. Brush and floss your teeth as directed. Before you have any dental work done, tell your dentist you are receiving this medicine. Do not become pregnant while taking this medicine or for 5 months after stopping it. Talk with your doctor or health care professional about your birth control options while taking this medicine. Women should inform their doctor if they wish to become pregnant or think they might be pregnant. There is a potential for serious side effects to an unborn child. Talk to your health care professional or pharmacist for more information. What side effects may I notice from receiving this medicine? Side effects that you should report to your doctor or health care professional as soon as possible: -allergic reactions like skin rash, itching or hives, swelling of the face, lips, or tongue -bone pain -breathing problems -dizziness -jaw pain, especially after dental work -redness, blistering, peeling of the skin -signs and symptoms of infection like fever or chills; cough; sore throat; pain or trouble passing urine -signs of low calcium like fast heartbeat, muscle cramps or muscle pain; pain, tingling, numbness in the hands or feet; seizures -unusual bleeding or bruising -unusually weak or tired Side effects that  usually do not require medical attention (report to your doctor or health care professional if they continue or are bothersome): -constipation -diarrhea -headache -joint pain -loss of appetite -muscle pain -runny nose -tiredness -upset stomach This list may not describe all possible side effects. Call your doctor for medical advice about side effects. You may report side effects to FDA at 1-800-FDA-1088. Where should I keep my medicine? This medicine is only given in a clinic, doctor's office, or other health care setting and will not be stored at home. NOTE: This sheet is a summary. It may not cover all possible information. If you have questions about this medicine, talk to your doctor, pharmacist, or health care provider.  2018 Elsevier/Gold Standard (2016-12-13 19:17:21)   

## 2017-10-25 ENCOUNTER — Other Ambulatory Visit: Payer: Self-pay | Admitting: Hematology

## 2017-10-27 ENCOUNTER — Other Ambulatory Visit: Payer: Self-pay

## 2017-10-27 MED ORDER — OMEPRAZOLE 40 MG PO CPDR: DELAYED_RELEASE_CAPSULE | ORAL | 0 refills | Status: DC

## 2017-11-10 ENCOUNTER — Telehealth: Payer: Self-pay
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
## 2017-11-13 ENCOUNTER — Ambulatory Visit: Payer: Medicare Other | Admitting: Hematology

## 2017-11-13 ENCOUNTER — Other Ambulatory Visit: Payer: Medicare Other

## 2017-11-13 ENCOUNTER — Ambulatory Visit: Payer: Medicare Other

## 2017-11-14 ENCOUNTER — Telehealth: Payer: Self-pay

## 2017-11-14 NOTE — Telephone Encounter (Signed)
Spoke with patient and she is aware of her new appts  Cindy Byrd

## 2017-11-16 ENCOUNTER — Encounter: Payer: Self-pay | Admitting: Hematology

## 2017-11-16 ENCOUNTER — Other Ambulatory Visit (HOSPITAL_BASED_OUTPATIENT_CLINIC_OR_DEPARTMENT_OTHER): Payer: Medicare Other

## 2017-11-16 ENCOUNTER — Ambulatory Visit (HOSPITAL_BASED_OUTPATIENT_CLINIC_OR_DEPARTMENT_OTHER): Payer: Medicare Other | Admitting: Hematology

## 2017-11-16 ENCOUNTER — Ambulatory Visit (HOSPITAL_BASED_OUTPATIENT_CLINIC_OR_DEPARTMENT_OTHER): Payer: Medicare Other

## 2017-11-16 ENCOUNTER — Telehealth: Payer: Self-pay | Admitting: Hematology

## 2017-11-16 VITALS — BP 179/82 | HR 68 | Temp 98.5°F | Resp 18 | Ht 68.0 in | Wt 144.7 lb

## 2017-11-16 DIAGNOSIS — Z86718 Personal history of other venous thrombosis and embolism: Secondary | ICD-10-CM

## 2017-11-16 DIAGNOSIS — R197 Diarrhea, unspecified: Secondary | ICD-10-CM

## 2017-11-16 DIAGNOSIS — C7951 Secondary malignant neoplasm of bone: Secondary | ICD-10-CM

## 2017-11-16 DIAGNOSIS — Z95828 Presence of other vascular implants and grafts: Secondary | ICD-10-CM

## 2017-11-16 DIAGNOSIS — C3491 Malignant neoplasm of unspecified part of right bronchus or lung: Secondary | ICD-10-CM

## 2017-11-16 DIAGNOSIS — Z7901 Long term (current) use of anticoagulants: Secondary | ICD-10-CM | POA: Diagnosis not present

## 2017-11-16 DIAGNOSIS — I159 Secondary hypertension, unspecified: Secondary | ICD-10-CM

## 2017-11-16 DIAGNOSIS — C3490 Malignant neoplasm of unspecified part of unspecified bronchus or lung: Secondary | ICD-10-CM

## 2017-11-16 DIAGNOSIS — G893 Neoplasm related pain (acute) (chronic): Secondary | ICD-10-CM

## 2017-11-16 DIAGNOSIS — I2699 Other pulmonary embolism without acute cor pulmonale: Secondary | ICD-10-CM

## 2017-11-16 DIAGNOSIS — Z86711 Personal history of pulmonary embolism: Secondary | ICD-10-CM | POA: Diagnosis not present

## 2017-11-16 LAB — CBC & DIFF AND RETIC
BASO%: 0.6 % (ref 0.0–2.0)
BASOS ABS: 0 10*3/uL (ref 0.0–0.1)
EOS ABS: 0.1 10*3/uL (ref 0.0–0.5)
EOS%: 2.1 % (ref 0.0–7.0)
HEMATOCRIT: 37.1 % (ref 34.8–46.6)
HEMOGLOBIN: 11.5 g/dL — AB (ref 11.6–15.9)
Immature Retic Fract: 8.1 % (ref 1.60–10.00)
LYMPH%: 24.7 % (ref 14.0–49.7)
MCH: 28.2 pg (ref 25.1–34.0)
MCHC: 31 g/dL — AB (ref 31.5–36.0)
MCV: 90.9 fL (ref 79.5–101.0)
MONO#: 0.3 10*3/uL (ref 0.1–0.9)
MONO%: 4.8 % (ref 0.0–14.0)
NEUT#: 4.5 10*3/uL (ref 1.5–6.5)
NEUT%: 67.8 % (ref 38.4–76.8)
PLATELETS: 182 10*3/uL (ref 145–400)
RBC: 4.08 10*6/uL (ref 3.70–5.45)
RDW: 14.1 % (ref 11.2–14.5)
Retic %: 1.75 % (ref 0.70–2.10)
Retic Ct Abs: 71.4 10*3/uL (ref 33.70–90.70)
WBC: 6.6 10*3/uL (ref 3.9–10.3)
lymph#: 1.6 10*3/uL (ref 0.9–3.3)

## 2017-11-16 LAB — COMPREHENSIVE METABOLIC PANEL
ALBUMIN: 3.3 g/dL — AB (ref 3.5–5.0)
ALK PHOS: 72 U/L (ref 40–150)
ALT: 27 U/L (ref 0–55)
ANION GAP: 10 meq/L (ref 3–11)
AST: 43 U/L — ABNORMAL HIGH (ref 5–34)
BUN: 12.6 mg/dL (ref 7.0–26.0)
CALCIUM: 8.9 mg/dL (ref 8.4–10.4)
CHLORIDE: 103 meq/L (ref 98–109)
CO2: 28 mEq/L (ref 22–29)
CREATININE: 0.9 mg/dL (ref 0.6–1.1)
EGFR: >60 mL/min/{1.73_m2} (ref >60)
Glucose: 149 mg/dl — ABNORMAL HIGH (ref 70–140)
POTASSIUM: 3.6 meq/L (ref 3.5–5.1)
Sodium: 141 mEq/L (ref 136–145)
Total Bilirubin: 0.36 mg/dL (ref 0.20–1.20)
Total Protein: 7.8 g/dL (ref 6.4–8.3)

## 2017-11-16 MED ORDER — SACCHAROMYCES BOULARDII 250 MG PO CAPS: 250.0000 mg | ORAL_CAPSULE | Freq: Two times a day (BID) | ORAL | 0 refills | Status: DC

## 2017-11-16 MED ORDER — OCTREOTIDE ACETATE 30 MG IM KIT
30.0000 mg | PACK | Freq: Once | INTRAMUSCULAR | Status: AC
  Administered 2017-11-16: 30 mg via INTRAMUSCULAR

## 2017-11-16 MED ORDER — AMLODIPINE BESYLATE 10 MG PO TABS: 10.0000 mg | ORAL_TABLET | Freq: Every day | ORAL | 2 refills | Status: DC

## 2017-11-16 MED ORDER — DENOSUMAB 120 MG/1.7ML ~~LOC~~ SOLN
120.0000 mg | Freq: Once | SUBCUTANEOUS | Status: AC
  Administered 2017-11-16: 120 mg via SUBCUTANEOUS

## 2017-11-16 NOTE — Patient Instructions (Signed)
Octreotide injection solution What is this medicine? OCTREOTIDE (ok TREE oh tide) is used to reduce blood levels of growth hormone in patients with a condition called acromegaly. This medicine also reduces flushing and watery diarrhea caused by certain types of cancer. This medicine may be used for other purposes; ask your health care provider or pharmacist if you have questions. COMMON BRAND NAME(S): Sandostatin, Sandostatin LAR What should I tell my health care provider before I take this medicine? They need to know if you have any of these conditions: -gallbladder disease -kidney disease -liver disease -an unusual or allergic reaction to octreotide, other medicines, foods, dyes, or preservatives -pregnant or trying to get pregnant -breast-feeding How should I use this medicine? This medicine is for injection under the skin or into a vein (only in emergency situations). It is usually given by a health care professional in a hospital or clinic setting. If you get this medicine at home, you will be taught how to prepare and give this medicine. Allow the injection solution to come to room temperature before use. Do not warm it artificially. Use exactly as directed. Take your medicine at regular intervals. Do not take your medicine more often than directed. It is important that you put your used needles and syringes in a special sharps container. Do not put them in a trash can. If you do not have a sharps container, call your pharmacist or healthcare provider to get one. Talk to your pediatrician regarding the use of this medicine in children. Special care may be needed. Overdosage: If you think you have taken too much of this medicine contact a poison control center or emergency room at once. NOTE: This medicine is only for you. Do not share this medicine with others. What if I miss a dose? If you miss a dose, take it as soon as you can. If it is almost time for your next dose, take only that  dose. Do not take double or extra doses. What may interact with this medicine? Do not take this medicine with any of the following medications: -cisapride -droperidol -general anesthetics -grepafloxacin -perphenazine -thioridazine This medicine may also interact with the following medications: -bromocriptine -cyclosporine -diuretics -medicines for blood pressure, heart disease, irregular heart beat -medicines for diabetes, including insulin -quinidine This list may not describe all possible interactions. Give your health care provider a list of all the medicines, herbs, non-prescription drugs, or dietary supplements you use. Also tell them if you smoke, drink alcohol, or use illegal drugs. Some items may interact with your medicine. What should I watch for while using this medicine? Visit your doctor or health care professional for regular checks on your progress. To help reduce irritation at the injection site, use a different site for each injection and make sure the solution is at room temperature before use. This medicine may cause increases or decreases in blood sugar. Signs of high blood sugar include frequent urination, unusual thirst, flushed or dry skin, difficulty breathing, drowsiness, stomach ache, nausea, vomiting or dry mouth. Signs of low blood sugar include chills, cool, pale skin or cold sweats, drowsiness, extreme hunger, fast heartbeat, headache, nausea, nervousness or anxiety, shakiness, trembling, unsteadiness, tiredness, or weakness. Contact your doctor or health care professional right away if you experience any of these symptoms. What side effects may I notice from receiving this medicine? Side effects that you should report to your doctor or health care professional as soon as possible: -allergic reactions like skin rash, itching or hives, swelling   of the face, lips, or tongue -changes in blood sugar -changes in heart rate -severe stomach pain Side effects that  usually do not require medical attention (report to your doctor or health care professional if they continue or are bothersome): -diarrhea or constipation -gas or stomach pain -nausea, vomiting -pain, redness, swelling and irritation at site where injected This list may not describe all possible side effects. Call your doctor for medical advice about side effects. You may report side effects to FDA at 1-800-FDA-1088. Where should I keep my medicine? Keep out of the reach of children. Store in a refrigerator between 2 and 8 degrees C (36 and 46 degrees F). Protect from light. Allow to come to room temperature naturally. Do not use artificial heat. If protected from light, the injection may be stored at room temperature between 20 and 30 degrees C (70 and 86 degrees F) for 14 days. After the initial use, throw away any unused portion of a multiple dose vial after 14 days. Throw away unused portions of the ampules after use. NOTE: This sheet is a summary. It may not cover all possible information. If you have questions about this medicine, talk to your doctor, pharmacist, or health care provider.  2018 Elsevier/Gold Standard (2008-06-17 16:56:04)  Denosumab injection What is this medicine? DENOSUMAB (den oh sue mab) slows bone breakdown. Prolia is used to treat osteoporosis in women after menopause and in men. Delton See is used to treat a high calcium level due to cancer and to prevent bone fractures and other bone problems caused by multiple myeloma or cancer bone metastases. Delton See is also used to treat giant cell tumor of the bone. This medicine may be used for other purposes; ask your health care provider or pharmacist if you have questions. COMMON BRAND NAME(S): Prolia, XGEVA What should I tell my health care provider before I take this medicine? They need to know if you have any of these conditions: -dental disease -having surgery or tooth extraction -infection -kidney disease -low levels of  calcium or Vitamin D in the blood -malnutrition -on hemodialysis -skin conditions or sensitivity -thyroid or parathyroid disease -an unusual reaction to denosumab, other medicines, foods, dyes, or preservatives -pregnant or trying to get pregnant -breast-feeding How should I use this medicine? This medicine is for injection under the skin. It is given by a health care professional in a hospital or clinic setting. If you are getting Prolia, a special MedGuide will be given to you by the pharmacist with each prescription and refill. Be sure to read this information carefully each time. For Prolia, talk to your pediatrician regarding the use of this medicine in children. Special care may be needed. For Delton See, talk to your pediatrician regarding the use of this medicine in children. While this drug may be prescribed for children as young as 13 years for selected conditions, precautions do apply. Overdosage: If you think you have taken too much of this medicine contact a poison control center or emergency room at once. NOTE: This medicine is only for you. Do not share this medicine with others. What if I miss a dose? It is important not to miss your dose. Call your doctor or health care professional if you are unable to keep an appointment. What may interact with this medicine? Do not take this medicine with any of the following medications: -other medicines containing denosumab This medicine may also interact with the following medications: -medicines that lower your chance of fighting infection -steroid medicines like prednisone  or cortisone This list may not describe all possible interactions. Give your health care provider a list of all the medicines, herbs, non-prescription drugs, or dietary supplements you use. Also tell them if you smoke, drink alcohol, or use illegal drugs. Some items may interact with your medicine. What should I watch for while using this medicine? Visit your doctor or  health care professional for regular checks on your progress. Your doctor or health care professional may order blood tests and other tests to see how you are doing. Call your doctor or health care professional for advice if you get a fever, chills or sore throat, or other symptoms of a cold or flu. Do not treat yourself. This drug may decrease your body's ability to fight infection. Try to avoid being around people who are sick. You should make sure you get enough calcium and vitamin D while you are taking this medicine, unless your doctor tells you not to. Discuss the foods you eat and the vitamins you take with your health care professional. See your dentist regularly. Brush and floss your teeth as directed. Before you have any dental work done, tell your dentist you are receiving this medicine. Do not become pregnant while taking this medicine or for 5 months after stopping it. Talk with your doctor or health care professional about your birth control options while taking this medicine. Women should inform their doctor if they wish to become pregnant or think they might be pregnant. There is a potential for serious side effects to an unborn child. Talk to your health care professional or pharmacist for more information. What side effects may I notice from receiving this medicine? Side effects that you should report to your doctor or health care professional as soon as possible: -allergic reactions like skin rash, itching or hives, swelling of the face, lips, or tongue -bone pain -breathing problems -dizziness -jaw pain, especially after dental work -redness, blistering, peeling of the skin -signs and symptoms of infection like fever or chills; cough; sore throat; pain or trouble passing urine -signs of low calcium like fast heartbeat, muscle cramps or muscle pain; pain, tingling, numbness in the hands or feet; seizures -unusual bleeding or bruising -unusually weak or tired Side effects that  usually do not require medical attention (report to your doctor or health care professional if they continue or are bothersome): -constipation -diarrhea -headache -joint pain -loss of appetite -muscle pain -runny nose -tiredness -upset stomach This list may not describe all possible side effects. Call your doctor for medical advice about side effects. You may report side effects to FDA at 1-800-FDA-1088. Where should I keep my medicine? This medicine is only given in a clinic, doctor's office, or other health care setting and will not be stored at home. NOTE: This sheet is a summary. It may not cover all possible information. If you have questions about this medicine, talk to your doctor, pharmacist, or health care provider.  2018 Elsevier/Gold Standard (2016-12-13 19:17:21)   

## 2017-11-16 NOTE — Telephone Encounter (Signed)
Gave avs and calendar for January and February 2019

## 2017-11-16 NOTE — Patient Instructions (Signed)
Thank you for choosing [facility] to provide your oncology and hematology care.  To afford each patient quality time with our providers, please arrive 30 minutes before your scheduled appointment time.  If you arrive late for your appointment, you may be asked to reschedule.  We strive to give you quality time with our providers, and arriving late affects you and other patients whose appointments are after yours.   If you are a no show for multiple scheduled visits, you may be dismissed from the clinic at the providers discretion.    Again, thank you for choosing [facility], our hope is that these requests will decrease the amount of time that you wait before being seen by our physicians.  ______________________________________________________________________  Should you have questions after your visit to the [facility], please contact our office at [phone] between the hours of 8:30 and 4:30 p.m.    Voicemails left after 4:30p.m will not be returned until the following business day.    For prescription refill requests, please have your pharmacy contact us directly.  Please also try to allow 48 hours for prescription requests.    Please contact the scheduling department for questions regarding scheduling.  For scheduling of procedures such as PET scans, CT scans, MRI, Ultrasound, etc please contact central scheduling at [phone].    Resources For Cancer Patients and Caregivers:   Oncolink.org:  A wonderful resource for patients and healthcare providers for information regarding your disease, ways to tract your treatment, what to expect, etc.     American Cancer Society:  800-227-2345  Can help patients locate various types of support and financial assistance  Cancer Care: 1-800-813-HOPE (4673) Provides financial assistance, online support groups, medication/co-pay assistance.    [place] DSS:  336-641-3447 Where to apply for food  stamps, Medicaid, and utility assistance  Medicare Rights Center: 800-333-4114 Helps people with Medicare understand their rights and benefits, navigate the Medicare system, and secure the quality healthcare they deserve  SCAT: 336-333-6589 [place] [employer]'s shared-ride transportation service for eligible riders who have a disability that prevents them from riding the fixed route bus.    For additional information on assistance programs please contact our social worker:   Grier Hock/Abigail Elmore:  [phone]            

## 2017-12-04 NOTE — Progress Notes (Signed)
[name]    HEMATOLOGY ONCOLOGY PROGRESS NOTE  Date of service: .11/16/2017   Patient Care Team: Tamsen Roers, MD as PCP - General (Family Medicine) [name], MD as Consulting Physician (Hematology and Oncology)  CHIEF COMPLAINTS/PURPOSE OF CONSULTATION: Follow-up for metastatic lung cancer  DIAGNOSIS:   #1 Metastatic non-small cell lung cancer with bilateral lung nodules and large metastatic lesion in the left Ilium. #2 Barrett's esophagus with some evidence of intramucosal adenocarcinoma of the esophagus. 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 Also had c diff colitis - treated   Current Treatment  1) Active surveillance 2) Xgeva 139m [place] q4weeks for bone metastases.  Previous Treatment  1 Palliative radiation therapy to the large left ilium metastases 2. IV Nivolumab x 20ycles (discontinued due to likely immune colitis) 3. Xgeva 1227mSC q4weeks for bone metastases.   HISTORY OF PRESENTING ILLNESS: (plz see my previous consultation for details of initial presentation)  INTERVAL HISTORY  Ms ToRingers here for her scheduled follow-up for metastatic lung cancer. She is overall doing well.  She notes that she continues to eat well and has gained back most off her weight. No overt new focal symptoms. Diarrhea under control. No fevers/chills/nightsweats/new abdominal pain . No chest pain or new shortness of breath.  MEDICAL HISTORY:  Past Medical History:  Diagnosis Date  . Barrett's esophagus   . Bone neoplasm 06/24/2015  . [facility]   metastatic poorly differentiated carcinoma. tumor left groin surgical removal with radiation tx.  . Cataract    BILATERAL  . Cigarette smoker two packs a day or less    Currently still smoking 2 PPD - Not interested in quitting at this time.  . Colitis 2017  . Colon polyps    hyperplastic, tubular adenomas, tubulovillous adenoma  .  Cough, persistent    hx. lung cancer ? primary-being evaluated, unsure of primary site.  . Depression 06/24/2015  . Diverticulosis   . Emphyse[facility] of lung ([facility]  . Endometriosis    Hysterectomy with BSO at age [age]rs  . [place]ophageal [facility] [place]   intramucosal  . Gastritis   . GERD (gastroesophageal reflux disease)   . H/O: pneumonia   . Hiatal hernia   . Hyperlipidemia   . Hypertension 06/24/2015   likely improved incidental to 40 lbs weight loss from her neoplasm. No Longer taking med for this as of 08-06-15  . IBS (irritable bowel syndrome)   . Pain    left hip-persistent"tumor of bone"-radiation tx. 10.  . Vitamin D deficiency disease    SURGICAL HISTORY: Past Surgical History:  Procedure Laterality Date  . BARTHOLIN GLAND CYST EXCISION  5249o ago   Does not want if it was an infected cyst or tumor. Was soon as delivery  . COLONOSCOPY W/ POLYPECTOMY     multiple times - last done 09/2014 per patient.  . ESOPHAGOGASTRODUODENOSCOPY (EGD) WITH PROPOFOL N/A 08/11/2015   Procedure: ESOPHAGOGASTRODUODENOSCOPY (EGD) WITH PROPOFOL;  Surgeon: JaJerene BearsMD;  Location: [facility];  Service: Gastroenterology;  Laterality: N/A;  . FLEXIBLE SIGMOIDOSCOPY N/A 06/24/2017   Procedure: FLEXIBLE SIGMOIDOSCOPY;  Surgeon: ArManus GunningMD;  Location: [facility];  Service: Gastroenterology;  Laterality: N/A;  . GANGLION CYST EXCISION    . KNEE ARTHROSCOPY  age about [age]rs  . TONSILLECTOMY    . TOTAL ABDOMINAL HYSTERECTOMY W/ BILATERAL SALPINGOOPHORECTOMY  at age [age]rs   For  endometriosis    SOCIAL HISTORY: Social History   Socioeconomic History  . Marital status: Widowed    Spouse name: Not on file  . Number of children: 2  . Years of education: Not on file  . Highest education level: Not on file  Social Needs  . Financial resource strain: Not on file  . Food insecurity - worry: Not on file  . Food insecurity - inability: Not on file  . Transportation needs  - medical: Not on file  . Transportation needs - non-medical: Not on file  Occupational History  . Not on file  Tobacco Use  . Smoking status: Former Smoker    Packs/day: 1.00    Years: 60.00    Pack years: 60.00    Types: Cigarettes    Last attempt to quit: 12/06/2015    Years since quitting: 1.9  . Smokeless tobacco: Never Used  Substance and Sexual Activity  . Alcohol use: No    Alcohol/week: 0.0 oz  . Drug use: No  . Sexual activity: No  Other Topics Concern  . Not on file  Social History Narrative  . Not on file    FAMILY HISTORY: Family History  Problem Relation Age of Onset  . Colon cancer Brother   . Colon cancer Brother   . Stroke Mother   . Colon cancer Father   . Breast cancer Daughter 68       ER/PR+ stage II    ALLERGIES:  is allergic to penicillins; remeron [mirtazapine]; and latex. patient wonders if she has a penicillin allergy but notes that she is uncertain about this.  MEDICATIONS:  Current Outpatient Medications  Medication Sig Dispense Refill  . calcium-vitamin D (OSCAL WITH D) 500-200 MG-UNIT tablet Take 2 tablets by mouth 3 (three) times daily.    [name] enoxaparin (LOVENOX) 80 MG/0.8ML injection Inject 0.8 mLs (80 mg total) into the skin daily. 30 Syringe 2  . lidocaine-prilocaine (EMLA) cream Apply small amount over port 1-2 hours prior to treatment, cover with plastic wrap (DO NOT RUB IN). 30 g prn  . LORazepam (ATIVAN) 1 MG tablet Take 1 tablet (1 mg total) by mouth every 8 (eight) hours as needed for anxiety (or nausea). 30 tablet 0  . omeprazole (PRILOSEC) 40 MG capsule TAKE 1 CAPSULE BY MOUTH EVERY DAY 90 capsule 0  . ondansetron (ZOFRAN) 8 MG tablet Take 1 tablet (8 mg total) by mouth every 8 (eight) hours as needed for nausea or vomiting. 60 tablet 0  . oxyCODONE-acetaminophen (PERCOCET/ROXICET) 5-325 MG tablet Take 1 tablet by mouth every 4 (four) hours as needed for severe pain. 30 tablet 0  . Vitamin D, Ergocalciferol, (DRISDOL) 50000 units  CAPS capsule Take 50,000 Units by mouth every 7 (seven) days.    [name] amLODipine (NORVASC) 10 MG tablet Take 1 tablet (10 mg total) by mouth daily. 30 tablet 2  . saccharomyces boulardii (FLORASTOR) 250 MG capsule Take 1 capsule (250 mg total) by mouth 2 (two) times daily. 60 capsule 0   No current facility-administered medications for this visit.     REVIEW OF SYSTEMS:    10 point review of systems done and was noted to be negative except as noted above.  PHYSICAL EXAMINATION: ECOG PERFORMANCE STATUS: 2 - Symptomatic, <50% confined to bed  Vitals:   11/16/17 1454 11/16/17 1504  BP: (!) 201/80 (!) 179/82  Pulse: 68   Resp: 18   Temp: 98.5 F (36.9 C)   SpO2: 97%  Filed Weights   11/16/17 1454  Weight: 144 lb 11.2 oz (65.6 kg)  . [name] Wt Readings from Last 3 Encounters:  11/16/17 144 lb 11.2 oz (65.6 kg)  09/18/17 144 lb 6.4 oz (65.5 kg)  08/21/17 139 lb 11.2 oz (63.4 kg)    GENERAL:alert, NAD, appears well. SKIN: skin color, texture, turgor are normal, no rashes or significant lesions EYES: normal, conjunctiva are pink and non-injected, sclera clear OROPHARYNX:no exudate, no erythema and lips, buccal mucosa, and tongue normal  NECK: supple, thyroid normal size, non-tender, without nodularity LYMPH:  no palpable lymphadenopathy in the cervical, axillary or inguinal LUNGS: clear to auscultation and bilateral reduced breath sounds with normal respiratory effort  HEART: regular rate & rhythm and no murmurs and resolved lower extremity edema ABDOMEN:abdomen soft, non-tender and normal bowel sounds Musculoskeletal: No pedal edema. No calf pain or tenderness.  PSYCH: alert & oriented x 3 with fluent speech NEURO: no focal motor/sensory deficits.  LABORATORY DATA:  I have reviewed the data as listed  . CBC Latest Ref Rng & Units 11/16/2017 10/16/2017 09/18/2017  WBC 3.9 - 10.3 10e3/uL 6.6 8.6 6.3  Hemoglobin 11.6 - 15.9 g/dL 11.5(L) 11.7 11.0(L)  Hematocrit 34.8 - 46.6 %  37.1 37.7 34.9  Platelets 145 - 400 10e3/uL 182 207 182    . CMP Latest Ref Rng & Units 11/16/2017 10/16/2017 09/18/2017  Glucose 70 - 140 mg/dl 149(H) 105 100  BUN 7.0 - 26.0 mg/dL 12.6 13.4 11.8  Creatinine 0.6 - 1.1 mg/dL 0.9 0.9 0.9  Sodium 136 - 145 mEq/L 141 141 141  Potassium 3.5 - 5.1 mEq/L 3.6 3.6 4.0  Chloride 101 - 111 mmol/L - - -  CO2 22 - 29 mEq/L 28 26 27   Calcium 8.4 - 10.4 mg/dL 8.9 8.8 8.4  Total Protein 6.4 - 8.3 g/dL 7.8 8.1 7.5  Total Bilirubin 0.20 - 1.20 mg/dL 0.36 0.31 0.25  Alkaline Phos 40 - 150 U/L 72 72 83  AST 5 - 34 U/L 43(H) 62(H) 32  ALT 0 - 55 U/L 27 66(H) 27      RADIOGRAPHIC STUDIES:  EXAM: 09/14/17 CT CHEST, ABDOMEN, AND PELVIS WITH CONTRAST TECHNIQUE: Multidetector CT imaging of the chest, abdomen and pelvis was performed following the standard protocol during bolus administration of intravenous contrast. CONTRAST:  146m ISOVUE-300 IOPAMIDOL (ISOVUE-300) INJECTION 61% COMPARISON:  Multiple exams, including 06/23/2017 and 05/29/2017 FINDINGS: CT CHEST FINDINGS Cardiovascular: Right Port-A-Cath tip:  Lower SVC. Coronary, aortic arch, and branch vessel atherosclerotic vascular disease. Truncated appearance of the left lower lobe pulmonary arterial tree, which has been present since the original diagnosis of pulmonary embolus on 09/02/2016. Mediastinum/Nodes: Distal paraesophageal lymph node 0.7 cm in short axis on image 51/2, previously 0.5 cm. Lungs/Pleura: Centrilobular emphysema. Mild scattered scarring. 4 by 5 mm subpleural nodule in the right lower lobe on image 94/4, stable. Mild airway thickening. Mild scattered scarring, including a bandlike scar in the left lower lobe on image 82/4, stable. Musculoskeletal: Old healed right anterior sixth rib fracture. Old healed left posterior tenth and eleventh rib fractures. Thoracic spondylosis. CT ABDOMEN PELVIS FINDINGS Hepatobiliary: Stable small scattered hypodense  hepatic lesions, likely cysts, although some of these lesions are technically too small to characterize. No new hepatic lesions. Gallbladder unremarkable. Pancreas: Unremarkable Spleen: Unremarkable Adrenals/Urinary Tract: Unremarkable Stomach/Bowel: 2 cm periampullary duodenal diverticulum. Sigmoid colon diverticulosis. Vascular/Lymphatic: Aortoiliac atherosclerotic vascular disease. Left common iliac node 0.8 cm in short axis on image 87/2, previously the same. Reproductive: Uterus absent.  Adnexa unremarkable. Other: 1.5 cm subcutaneous density along the right buttock region, possibly from an injection, image 105/2, new compared to the prior exam. Alternatively this could be a small inflammatory lesion. Musculoskeletal: Large lytic and rim sclerotic lesion of the left iliac bone with mildly displaced pathologic fracture extending into the SI joint, no appreciable change from prior. Lumbar spondylosis and degenerative disc disease. IMPRESSION: 1. Essentially stable appearance the large lytic and rim sclerotic lesion left iliac bone with mildly displaced pathologic fracture extending vertically through the iliac bone and into the SI joint. No other osseous metastatic lesions are identified. 2. Stable 4 by 5 mm right lower lobe subpleural nodule, likely benign given long-term stability. Stable bandlike opacities in the lungs favoring scarring. 3. No current pathologically enlarged adenopathy. 4. Truncated appearance of the left lower lobe pulmonary arterial tree, which appears to be a chronic result from prior pulmonary embolus originally diagnosed on 09/02/2016. 5. Other imaging findings of potential clinical significance: Aortic Atherosclerosis (ICD10-I70.0) and Emphysema (ICD10-J43.9). Airway thickening is present, suggesting bronchitis or reactive airways disease. Old healed bilateral rib fractures. Scattered hepatic lesions are probably cysts. Sigmoid  diverticulosis.    CT Abdomen Pelvis W Contrast (Accession [id number]) (Order [id number])  Imaging  Date: 06/22/2017 Department: [facility] TELEMETRY/UROLOGY [facility] Released By/Authorizing: Ripley Fraise, MD (auto-released)  Exam Information   Status Exam Begun  Exam Ended   Final [99] 06/23/2017 1:25 AM 06/23/2017 1:36 AM  PACS Images   Show images for CT Abdomen Pelvis W Contrast  Study Result   CLINICAL DATA:  Left lower quadrant pain x2 days with leukocytosis.  EXAM: CT ABDOMEN AND PELVIS WITH CONTRAST  TECHNIQUE: Multidetector CT imaging of the abdomen and pelvis was performed using the standard protocol following bolus administration of intravenous contrast.  CONTRAST:  100 cc Isovue-300  COMPARISON:  05/29/2017  FINDINGS: Lower chest: The included heart is normal in size without pericardial effusion. There is bibasilar atelectasis and/or scarring. No effusion or pneumothorax. Small hiatal hernia.  Hepatobiliary: Physiologic distention of the gallbladder without stones. Scattered stable water attenuating cysts and/or tiny hemangiomata of the liver, the largest are adjacent to the gallbladder fossa measuring approximately 10 and 11 mm diameter. Some are too small to further characterize however. There is no biliary dilatation.  Pancreas: Atrophic pancreas without inflammation.  Spleen: Normal  Adrenals/Urinary Tract: Normal bilateral adrenal glands. No enhancing renal mass or obstructive uropathy. No nephrolithiasis.  Stomach/Bowel: There is diffuse transmural thickening of the descending colon through rectosigmoid consistent with colitis. No bowel obstruction is seen. A moderate amount of fecal retention is seen within the ascending and transverse colon. There mild fluid-filled distention of small bowel is noted without obstruction. Stomach is contracted. The appendix is not visualized. No pericecal inflammation is noted  however.  Vascular/Lymphatic: Aortoiliac atherosclerosis. No pathologically enlarged lymph nodes by CT size criteria.  Reproductive: Hysterectomy.  No adnexal mass.  Other: No free air nor free fluid.  No hernia.  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 Chronic posterior left lower rib fractures.  IMPRESSION: 1. Colitis of the descending through rectosigmoid colon. 2. Mixed lytic and blastic lesion of the left iliac bone with pathologic fracture, bony expansion and thickening of the cortex is noted as before. Chronic left posterior rib fractures with healing. 3. Incidental findings of hepatic cysts and/or hemangiomata and aortoiliac atherosclerosis.  Electronically Signed   By: Ashley Royalty M.D.   On: 06/23/2017 01:51      ASSESSMENT & PLAN:   #1 Metastatic poorly differentiated carcinoma with likely lung primary [non-small cell lung cancer].  CT of the head with and without contrast showed no evidence of metastatic disease. Patient notes much improved pain control. EGFR blood test mutation analysis negative. Patient's pain is much better controlled after radiation for the painful ilium met. CT chest abdomen pelvis 04/19/2016 shows no evidence of disease progression. Patient tolerated Nivolumab very well but was discontinued when she developed grade 2 Immune colitis. Has been off Nivolumab for >6 months  CT chest abdomen pelvis on 06/24/2016 shows no evidence of new disease or progression of metastatic disease. CT chest abdomen pelvis 09/06/2016 shows 1. Mixed interval response to therapy. 2. There is a new left ventral chest wall lesion deep to the pectoralis musculature worrisome for metastatic disease. 3. Posterior lower lobe nodular densities are identified which may  reflect areas of pulmonary metastasis. 4. Interval decrease in size of destructive lesion involving the left iliac bone.  CT chest abd pelvis 12/08/2016: Cystic mass involving the left ventral chest wall has resolved in the interval. Likely was a hematoma due to trauma. Interval increase in size of pleural base mass overlying the posterior and inferior left lower lobe. There is also a new left pleural effusion identified.  CT chest 02/01/2017: Residual irregular soft tissue thickening/volume loss and trace left pleural fluid at the base of the left hemithorax, overall improved in appearance from 12/08/2016. No measurable lesion.  CT chest 05/29/2017 shows no residual pleural based mass or significant pleural effusion in the left hemithorax. No evidence of thoracic metastatic disease. No evidence of progressive metastatic disease within the abdomen or pelvis. Mixed lytic and blastic lesion involving the left iliac bone and associated pathologic fracture are unchanged.   CT CAP 09/14/17 shows no new changes. She does have slight displacement of her fractured left iliac bone. Evidence of stable disease.   PLAN -labs stable. -patient has no overt clinical evidence of metastatic lung cancer progression at this time. -We shall continue her Xgeva every 4 weeks  -no indication for additional palliative systemic treatment for metastatic lung cancer at this time.  #2 diarrhea-  now resolved was previously. grade 2 likely related to immune colitis from her Nivolumaband also had c diff colitis (s/p vancomycin) and possible underlying IBD She was previously on on Lialda, budesonide,probiotics and lomotil but not currently taking any of these. #3 h/o diverticulitis and c dff colitis - now resolved Plan  -continue on sandostatin today and q4weeks.  -continue on Lialda, she has stopped. She will follow up with Dr. [name] to see if she should restart Lialda.   #4 DVT and PE  -will need to be on ongoing Lovenox  therapy -Monitor for bleeding .  #5 Barrett's esophagus 4cms in the distal esophagus with low and high-grade dysplasia cannot rule out an early intra-mucosal esophageal adenocarcinoma. Plan -Patient being monitored by Dr. [name] from GI. -patient notes that she does not want to f/u at [facility] for further intervention as per GI plan.  #6 Abnormal LFTs - normalized on rpt. No overt pathology on CT abd/pelvis.  -continue Sandostatin and Xgeva q4weeks -CT chest/abd/pelvis in 6-7 weeks -RTC with Dr Irene Limbo in 8 weeks with labs  All questions were answered. The patient knows to call the clinic with any problems, questions or concerns.  Sullivan Lone MD MS Hematology/Oncology  Physician [facility]  (Office): [phone] (Work cell): [phone] (Fax): [phone]

## 2017-12-11 ENCOUNTER — Other Ambulatory Visit: Payer: Self-pay | Admitting: *Deleted

## 2017-12-11 ENCOUNTER — Telehealth: Payer: Self-pay | Admitting: Hematology

## 2017-12-11 NOTE — Telephone Encounter (Signed)
Scheduled appt per 1/7 sch msg - spoke with patient and confirmed appt.

## 2017-12-14 ENCOUNTER — Inpatient Hospital Stay: Payer: Medicare Other

## 2017-12-14 ENCOUNTER — Inpatient Hospital Stay: Payer: Medicare Other | Attending: Hematology

## 2017-12-14 VITALS — BP 132/84 | HR 69 | Temp 98.5°F | Resp 20

## 2017-12-14 DIAGNOSIS — C3412 Malignant neoplasm of upper lobe, left bronchus or lung: Secondary | ICD-10-CM | POA: Insufficient documentation

## 2017-12-14 DIAGNOSIS — C3490 Malignant neoplasm of unspecified part of unspecified bronchus or lung: Secondary | ICD-10-CM

## 2017-12-14 DIAGNOSIS — C3491 Malignant neoplasm of unspecified part of right bronchus or lung: Secondary | ICD-10-CM

## 2017-12-14 DIAGNOSIS — Z95828 Presence of other vascular implants and grafts: Secondary | ICD-10-CM

## 2017-12-14 DIAGNOSIS — C7951 Secondary malignant neoplasm of bone: Secondary | ICD-10-CM | POA: Diagnosis present

## 2017-12-14 LAB — COMPREHENSIVE METABOLIC PANEL
ALBUMIN: 3 g/dL — AB (ref 3.5–5.0)
ALK PHOS: 78 U/L (ref 40–150)
ALT: 15 U/L (ref 0–55)
ANION GAP: 6 (ref 3–11)
AST: 22 U/L (ref 5–34)
BILIRUBIN TOTAL: 0.3 mg/dL (ref 0.2–1.2)
BUN: 15 mg/dL (ref 7–26)
CALCIUM: 9.1 mg/dL (ref 8.4–10.4)
CO2: 24 mmol/L (ref 22–29)
CREATININE: 1.03 mg/dL (ref 0.60–1.10)
Chloride: 111 mmol/L — ABNORMAL HIGH (ref 98–109)
GFR calc Af Amer: 59 mL/min — ABNORMAL LOW (ref >60)
GFR calc non Af Amer: 51 mL/min — ABNORMAL LOW (ref >60)
Glucose, Bld: 108 mg/dL (ref 70–140)
Potassium: 3.9 mmol/L (ref 3.3–4.7)
Sodium: 141 mmol/L (ref 136–145)
TOTAL PROTEIN: 7.9 g/dL (ref 6.4–8.3)

## 2017-12-14 MED ORDER — OCTREOTIDE ACETATE 30 MG IM KIT
PACK | INTRAMUSCULAR | Status: AC
  Filled 2017-12-14: qty 1

## 2017-12-14 MED ORDER — DENOSUMAB 120 MG/1.7ML ~~LOC~~ SOLN
120.0000 mg | Freq: Once | SUBCUTANEOUS | Status: AC
  Administered 2017-12-14: 120 mg via SUBCUTANEOUS

## 2017-12-14 MED ORDER — OCTREOTIDE ACETATE 30 MG IM KIT
30.0000 mg | PACK | Freq: Once | INTRAMUSCULAR | Status: AC
  Administered 2017-12-14: 30 mg via INTRAMUSCULAR

## 2017-12-14 NOTE — Patient Instructions (Signed)
Octreotide injection solution What is this medicine? OCTREOTIDE (ok TREE oh tide) is used to reduce blood levels of growth hormone in patients with a condition called acromegaly. This medicine also reduces flushing and watery diarrhea caused by certain types of cancer. This medicine may be used for other purposes; ask your health care provider or pharmacist if you have questions. COMMON BRAND NAME(S): Sandostatin, Sandostatin LAR What should I tell my health care provider before I take this medicine? They need to know if you have any of these conditions: -gallbladder disease -kidney disease -liver disease -an unusual or allergic reaction to octreotide, other medicines, foods, dyes, or preservatives -pregnant or trying to get pregnant -breast-feeding How should I use this medicine? This medicine is for injection under the skin or into a vein (only in emergency situations). It is usually given by a health care professional in a hospital or clinic setting. If you get this medicine at home, you will be taught how to prepare and give this medicine. Allow the injection solution to come to room temperature before use. Do not warm it artificially. Use exactly as directed. Take your medicine at regular intervals. Do not take your medicine more often than directed. It is important that you put your used needles and syringes in a special sharps container. Do not put them in a trash can. If you do not have a sharps container, call your pharmacist or healthcare provider to get one. Talk to your pediatrician regarding the use of this medicine in children. Special care may be needed. Overdosage: If you think you have taken too much of this medicine contact a poison control center or emergency room at once. NOTE: This medicine is only for you. Do not share this medicine with others. What if I miss a dose? If you miss a dose, take it as soon as you can. If it is almost time for your next dose, take only that  dose. Do not take double or extra doses. What may interact with this medicine? Do not take this medicine with any of the following medications: -cisapride -droperidol -general anesthetics -grepafloxacin -perphenazine -thioridazine This medicine may also interact with the following medications: -bromocriptine -cyclosporine -diuretics -medicines for blood pressure, heart disease, irregular heart beat -medicines for diabetes, including insulin -quinidine This list may not describe all possible interactions. Give your health care provider a list of all the medicines, herbs, non-prescription drugs, or dietary supplements you use. Also tell them if you smoke, drink alcohol, or use illegal drugs. Some items may interact with your medicine. What should I watch for while using this medicine? Visit your doctor or health care professional for regular checks on your progress. To help reduce irritation at the injection site, use a different site for each injection and make sure the solution is at room temperature before use. This medicine may cause increases or decreases in blood sugar. Signs of high blood sugar include frequent urination, unusual thirst, flushed or dry skin, difficulty breathing, drowsiness, stomach ache, nausea, vomiting or dry mouth. Signs of low blood sugar include chills, cool, pale skin or cold sweats, drowsiness, extreme hunger, fast heartbeat, headache, nausea, nervousness or anxiety, shakiness, trembling, unsteadiness, tiredness, or weakness. Contact your doctor or health care professional right away if you experience any of these symptoms. What side effects may I notice from receiving this medicine? Side effects that you should report to your doctor or health care professional as soon as possible: -allergic reactions like skin rash, itching or hives, swelling   of the face, lips, or tongue -changes in blood sugar -changes in heart rate -severe stomach pain Side effects that  usually do not require medical attention (report to your doctor or health care professional if they continue or are bothersome): -diarrhea or constipation -gas or stomach pain -nausea, vomiting -pain, redness, swelling and irritation at site where injected This list may not describe all possible side effects. Call your doctor for medical advice about side effects. You may report side effects to FDA at 1-800-FDA-1088. Where should I keep my medicine? Keep out of the reach of children. Store in a refrigerator between 2 and 8 degrees C (36 and 46 degrees F). Protect from light. Allow to come to room temperature naturally. Do not use artificial heat. If protected from light, the injection may be stored at room temperature between 20 and 30 degrees C (70 and 86 degrees F) for 14 days. After the initial use, throw away any unused portion of a multiple dose vial after 14 days. Throw away unused portions of the ampules after use. NOTE: This sheet is a summary. It may not cover all possible information. If you have questions about this medicine, talk to your doctor, pharmacist, or health care provider.  2018 Elsevier/Gold Standard (2008-06-17 16:56:04)  Denosumab injection What is this medicine? DENOSUMAB (den oh sue mab) slows bone breakdown. Prolia is used to treat osteoporosis in women after menopause and in men. Delton See is used to treat a high calcium level due to cancer and to prevent bone fractures and other bone problems caused by multiple myeloma or cancer bone metastases. Delton See is also used to treat giant cell tumor of the bone. This medicine may be used for other purposes; ask your health care provider or pharmacist if you have questions. COMMON BRAND NAME(S): Prolia, XGEVA What should I tell my health care provider before I take this medicine? They need to know if you have any of these conditions: -dental disease -having surgery or tooth extraction -infection -kidney disease -low levels of  calcium or Vitamin D in the blood -malnutrition -on hemodialysis -skin conditions or sensitivity -thyroid or parathyroid disease -an unusual reaction to denosumab, other medicines, foods, dyes, or preservatives -pregnant or trying to get pregnant -breast-feeding How should I use this medicine? This medicine is for injection under the skin. It is given by a health care professional in a hospital or clinic setting. If you are getting Prolia, a special MedGuide will be given to you by the pharmacist with each prescription and refill. Be sure to read this information carefully each time. For Prolia, talk to your pediatrician regarding the use of this medicine in children. Special care may be needed. For Delton See, talk to your pediatrician regarding the use of this medicine in children. While this drug may be prescribed for children as young as 13 years for selected conditions, precautions do apply. Overdosage: If you think you have taken too much of this medicine contact a poison control center or emergency room at once. NOTE: This medicine is only for you. Do not share this medicine with others. What if I miss a dose? It is important not to miss your dose. Call your doctor or health care professional if you are unable to keep an appointment. What may interact with this medicine? Do not take this medicine with any of the following medications: -other medicines containing denosumab This medicine may also interact with the following medications: -medicines that lower your chance of fighting infection -steroid medicines like prednisone  or cortisone This list may not describe all possible interactions. Give your health care provider a list of all the medicines, herbs, non-prescription drugs, or dietary supplements you use. Also tell them if you smoke, drink alcohol, or use illegal drugs. Some items may interact with your medicine. What should I watch for while using this medicine? Visit your doctor or  health care professional for regular checks on your progress. Your doctor or health care professional may order blood tests and other tests to see how you are doing. Call your doctor or health care professional for advice if you get a fever, chills or sore throat, or other symptoms of a cold or flu. Do not treat yourself. This drug may decrease your body's ability to fight infection. Try to avoid being around people who are sick. You should make sure you get enough calcium and vitamin D while you are taking this medicine, unless your doctor tells you not to. Discuss the foods you eat and the vitamins you take with your health care professional. See your dentist regularly. Brush and floss your teeth as directed. Before you have any dental work done, tell your dentist you are receiving this medicine. Do not become pregnant while taking this medicine or for 5 months after stopping it. Talk with your doctor or health care professional about your birth control options while taking this medicine. Women should inform their doctor if they wish to become pregnant or think they might be pregnant. There is a potential for serious side effects to an unborn child. Talk to your health care professional or pharmacist for more information. What side effects may I notice from receiving this medicine? Side effects that you should report to your doctor or health care professional as soon as possible: -allergic reactions like skin rash, itching or hives, swelling of the face, lips, or tongue -bone pain -breathing problems -dizziness -jaw pain, especially after dental work -redness, blistering, peeling of the skin -signs and symptoms of infection like fever or chills; cough; sore throat; pain or trouble passing urine -signs of low calcium like fast heartbeat, muscle cramps or muscle pain; pain, tingling, numbness in the hands or feet; seizures -unusual bleeding or bruising -unusually weak or tired Side effects that  usually do not require medical attention (report to your doctor or health care professional if they continue or are bothersome): -constipation -diarrhea -headache -joint pain -loss of appetite -muscle pain -runny nose -tiredness -upset stomach This list may not describe all possible side effects. Call your doctor for medical advice about side effects. You may report side effects to FDA at 1-800-FDA-1088. Where should I keep my medicine? This medicine is only given in a clinic, doctor's office, or other health care setting and will not be stored at home. NOTE: This sheet is a summary. It may not cover all possible information. If you have questions about this medicine, talk to your doctor, pharmacist, or health care provider.  2018 Elsevier/Gold Standard (2016-12-13 19:17:21)   

## 2018-01-04 ENCOUNTER — Encounter (HOSPITAL_COMMUNITY): Payer: Self-pay

## 2018-01-04 ENCOUNTER — Ambulatory Visit (HOSPITAL_COMMUNITY)
Admission: RE | Admit: 2018-01-04 | Discharge: 2018-01-04 | Disposition: A | Discharge status: Home / Self Care | Payer: Medicare Other | Source: Ambulatory Visit | Attending: Hematology | Admitting: Hematology

## 2018-01-04 DIAGNOSIS — K449 Diaphragmatic hernia without obstruction or gangrene: Secondary | ICD-10-CM | POA: Insufficient documentation

## 2018-01-04 DIAGNOSIS — M84454A Pathological fracture, pelvis, initial encounter for fracture: Secondary | ICD-10-CM | POA: Diagnosis not present

## 2018-01-04 DIAGNOSIS — C7951 Secondary malignant neoplasm of bone: Secondary | ICD-10-CM | POA: Diagnosis not present

## 2018-01-04 DIAGNOSIS — I700 Atherosclerosis of aorta: Secondary | ICD-10-CM | POA: Insufficient documentation

## 2018-01-04 DIAGNOSIS — J439 Emphysema, unspecified: Secondary | ICD-10-CM | POA: Diagnosis not present

## 2018-01-04 DIAGNOSIS — I2510 Atherosclerotic heart disease of native coronary artery without angina pectoris: Secondary | ICD-10-CM | POA: Diagnosis not present

## 2018-01-04 DIAGNOSIS — C3491 Malignant neoplasm of unspecified part of right bronchus or lung: Secondary | ICD-10-CM | POA: Diagnosis present

## 2018-01-04 DIAGNOSIS — K5730 Diverticulosis of large intestine without perforation or abscess without bleeding: Secondary | ICD-10-CM | POA: Insufficient documentation

## 2018-01-04 MED ORDER — IOPAMIDOL (ISOVUE-300) INJECTION 61%
INTRAVENOUS | Status: AC
  Administered 2018-01-04: 100 mL via INTRAVENOUS
  Filled 2018-01-04: qty 100

## 2018-01-04 MED ORDER — IOPAMIDOL (ISOVUE-300) INJECTION 61%
100.0000 mL | Freq: Once | INTRAVENOUS | Status: AC | PRN
  Administered 2018-01-04: 100 mL via INTRAVENOUS

## 2018-01-10 NOTE — Progress Notes (Signed)
Subjective:    Patient ID: Cindy Byrd, female    DOB: Jul 25, 1941, 77 y.o.   MRN: [id number]  HPI:  Cindy Byrd is here to establish as a new pt.  She is a pleasant 77 year old female.  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  She has 120 pack year hx, last tobacco use 2016. Current tx: 1) Active surveillance 2) Xgeva 150m [place] q4weeks for bone metastases. 3) Sandostatin q4weeks for diarrhea - immune colitis She has CT scan every 2 months/regular follow-up with Oncology/Dr. KIrene Limbo last OV was last week.  Per pt Dr. KIrene Limbocalls her condition "Stable, not remission". She reports medication compliance and denies SE.  She denies CP/dyspnea/palpiations.  She has L hip pain that limits her exercising and prolonged walking. She reports excellent support system of local friends/family and denies thoughts of harming himself/others.  Patient Care Team    Relationship Specialty Notifications Start End  DMina MarbleD, NP PCP - General Family Medicine  01/15/18   KBrunetta Genera MD Consulting Physician Hematology and Oncology Admissions 06/24/15     Patient Active Problem List   Diagnosis Date Noted  . Malignant neoplasm of lung (HP[place]   . Colitis 06/23/2017  . Left lower quadrant pain 06/23/2017  . Fracture of left iliac crest ([facility] 06/23/2017  . Metastatic lung cancer (metastasis from lung to other site), unspecified laterality (HC[place]   . Hypocalcemia 09/08/2016  . Vitamin D deficiency 09/08/2016  . New onset a-fib ([facility] 09/08/2016  . Chest pain   . Thrush of mouth and esophagus (HL[place]   . Protein-calorie malnutrition, severe (H[place]   . Colitis determined by colorectal biopsy   . Bilateral pulmonary embolism (HE[place] 09/07/2016  . DVT of lower extremity, bilateral (HD[place] 09/07/2016  . Palliative care by specialist   . Goals of care, counseling/discussion   . Advance care planning   . Diarrhea   .  Ulcerative pancolitis without complication ([facility]   . Cancer (HE[place]   . Pressure injury of skin 09/02/2016  . HCAP (healthcare-associated pneumonia) 09/01/2016  . ARF (acute renal failure) (HD[place] 09/01/2016  . Nausea with vomiting 07/18/2016  . Dehydration 07/18/2016  . Hypokalemia 07/18/2016  . Hypoalbuminemia due to protein-calorie malnutrition ([facility] 07/18/2016  . Peripheral edema 07/18/2016  . Diarrhea due to drug 05/19/2016  . Port catheter in place 04/07/2016  . Nicotine addiction 01/19/2016  . Barrett's esophagus determined by biopsy 09/04/2015  . Gastritis   . Primary malignant neoplasm of lung metastatic to other site (HE[place] 08/06/2015  . Esophageal reflux 08/04/2015  . Bone metastasis (HD[place] 06/24/2015  . Neoplasm related pain 06/24/2015  . Protein calorie malnutrition (HS[place] 06/24/2015  . Anorexia 06/24/2015  . Heavy smoker (more than 20 cigarettes per day) 06/24/2015  . Depression 06/24/2015  . HTN (hypertension) 06/24/2015     Past Medical History:  Diagnosis Date  . Barrett's esophagus   . Bone neoplasm 06/24/2015  . Cancer ([facility]    metastatic poorly differentiated carcinoma. tumor left groin surgical removal with radiation tx.  . Cataract    BILATERAL  . Cigarette smoker two packs a day or less    Currently still smoking 2 PPD - Not interested in quitting at this time.  . Colitis 2017  . Colon polyps    hyperplastic, tubular adenomas, tubulovillous adenoma  . Cough, persistent    hx. lung cancer ? primary-being evaluated, unsure of  primary site.  . Depression 06/24/2015  . Diverticulosis   . Emphysema of lung ([place])   . Endometriosis    Hysterectomy with BSO at age 46 yrs  . Esophageal adenocarcinoma ([place]) 08/11/15   intramucosal  . Gastritis   . GERD (gastroesophageal reflux disease)   . H/O: pneumonia   . Hiatal hernia   . Hyperlipidemia   . Hypertension 06/24/2015   likely improved incidental to 40 lbs weight loss from her neoplasm. No Longer taking med for  this as of 08-06-15  . IBS (irritable bowel syndrome)   . Pain    left hip-persistent"tumor of bone"-radiation tx. 10.  . Vitamin D deficiency disease      Past Surgical History:  Procedure Laterality Date  . BARTHOLIN GLAND CYST EXCISION  77 yo ago   Does not want if it was an infected cyst or tumor. Was soon as delivery  . COLONOSCOPY W/ POLYPECTOMY     multiple times - last done 09/2014 per patient.  . ESOPHAGOGASTRODUODENOSCOPY (EGD) WITH PROPOFOL N/A 08/11/2015   Procedure: ESOPHAGOGASTRODUODENOSCOPY (EGD) WITH PROPOFOL;  Surgeon: [name], MD;  Location: [facility];  Service: Gastroenterology;  Laterality: N/A;  . FLEXIBLE SIGMOIDOSCOPY N/A 06/24/2017   Procedure: FLEXIBLE SIGMOIDOSCOPY;  Surgeon: Manus Gunning, MD;  Location: [facility];  Service: Gastroenterology;  Laterality: N/A;  . GANGLION CYST EXCISION    . KNEE ARTHROSCOPY  age about 37 yrs  . TONSILLECTOMY    . TOTAL ABDOMINAL HYSTERECTOMY W/ BILATERAL SALPINGOOPHORECTOMY  at age 39 yrs   For endometriosis     Family History  Problem Relation Age of Onset  . Colon cancer Brother   . Colon cancer Brother   . Stroke Mother   . Colon cancer Father   . Breast cancer Daughter 41       ER/PR+ stage II     Social History   Substance and Sexual Activity  Drug Use No     Social History   Substance and Sexual Activity  Alcohol Use No  . Alcohol/week: 0.0 oz     Social History   Tobacco Use  Smoking Status Former Smoker  . Packs/day: 1.00  . Years: 60.00  . Pack years: 60.00  . Types: Cigarettes  . Last attempt to quit: 12/06/2015  . Years since quitting: 2.1  Smokeless Tobacco Never Used     Outpatient Encounter Medications as of 01/15/2018  Medication Sig  . amLODipine (NORVASC) 10 MG tablet Take 1 tablet (10 mg total) by mouth daily.  . calcium-vitamin D (OSCAL WITH D) 500-200 MG-UNIT tablet Take 2 tablets by mouth 3 (three) times daily.  [name] enoxaparin (LOVENOX) 80 MG/0.8ML  injection Inject 0.8 mLs (80 mg total) into the skin daily.  [name] lidocaine-prilocaine (EMLA) cream Apply small amount over port 1-2 hours prior to treatment, cover with plastic wrap (DO NOT RUB IN).  . LORazepam (ATIVAN) 1 MG tablet Take 1 tablet (1 mg total) by mouth every 8 (eight) hours as needed for anxiety (or nausea).  [name] omeprazole (PRILOSEC) 40 MG capsule TAKE 1 CAPSULE BY MOUTH EVERY DAY  . ondansetron (ZOFRAN) 8 MG tablet Take 1 tablet (8 mg total) by mouth every 8 (eight) hours as needed for nausea or vomiting.  . saccharomyces boulardii (FLORASTOR) 250 MG capsule Take 1 capsule (250 mg total) by mouth 2 (two) times daily.  . Vitamin D, Ergocalciferol, (DRISDOL) 50000 units CAPS capsule Take 50,000 Units by mouth every 7 (seven) days.  . [DISCONTINUED]  amLODipine (NORVASC) 10 MG tablet Take 1 tablet (10 mg total) by mouth daily.  . [DISCONTINUED] omeprazole (PRILOSEC) 40 MG capsule TAKE 1 CAPSULE BY MOUTH EVERY DAY  . [DISCONTINUED] oxyCODONE-acetaminophen (PERCOCET/ROXICET) 5-325 MG tablet Take 1 tablet by mouth every 4 (four) hours as needed for severe pain.  . [DISCONTINUED] omeprazole (PRILOSEC) 40 MG capsule TAKE 1 CAPSULE BY MOUTH EVERY DAY   No facility-administered encounter medications on file as of 01/15/2018.     Allergies: Penicillins; Remeron [mirtazapine]; and Latex  Body mass index is 22.51 kg/m.  Blood pressure (!) 154/68, pulse 65, height 5' 7.99" (1.727 m), weight 148 lb (67.1 kg).     Review of Systems  Constitutional: Positive for fatigue. Negative for activity change, appetite change, chills, diaphoresis, fever and unexpected weight change.  HENT: Negative for congestion.   Eyes: Negative for visual disturbance.  Respiratory: Negative for cough, chest tightness, shortness of breath, wheezing and stridor.   Cardiovascular: Negative for chest pain, palpitations and leg swelling.  Gastrointestinal: Negative for abdominal distention, abdominal pain, blood in  stool, constipation, diarrhea, nausea and vomiting.  Genitourinary: Negative for difficulty urinating and flank pain.  Musculoskeletal: Positive for arthralgias, gait problem and myalgias. Negative for back pain, joint swelling, neck pain and neck stiffness.  Skin: Negative for color change, pallor, rash and wound.  Neurological: Negative for dizziness and headaches.  Hematological: Does not bruise/bleed easily.  Psychiatric/Behavioral: Negative for self-injury, sleep disturbance and suicidal ideas.       Objective:   Physical Exam  Constitutional: She is oriented to person, place, and time. She appears well-developed and well-nourished. No distress.  HENT:  Head: Normocephalic and atraumatic.  Right Ear: External ear normal.  Eyes: Conjunctivae are normal. Pupils are equal, round, and reactive to light.  Cardiovascular: Normal rate, regular rhythm, normal heart sounds and intact distal pulses.  No murmur heard. Pulmonary/Chest: Effort normal. No respiratory distress. She has no wheezes. She has no rales. She exhibits no tenderness.  R anterior chest port-a-cath  Abdominal: Soft. Bowel sounds are normal.  Neurological: She is alert and oriented to person, place, and time.  Skin: Skin is warm and dry. No rash noted. She is not diaphoretic. No erythema. No pallor.  Psychiatric: She has a normal mood and affect. Her behavior is normal. Judgment and thought content normal.  Nursing note and vitals reviewed.         Assessment & Plan:   1. Health care maintenance   2. Essential hypertension   3. Malignant neoplasm of lung, unspecified laterality, unspecified part of lung ([place])   4. Port catheter in place   5. New onset a-fib ([place])   6. Acute deep vein thrombosis (DVT) of proximal vein of both lower extremities (HCC)     HTN (hypertension) BP slightly above goal 154/68 Continue Norvasc 89m once daily She denies CP/palpiations  Malignant neoplasm of lung ([place] CT/Oncology OV  every 2 months Current tx, per Oncologist at 01/11/18 OV- "-patient has no overt clinical evidence of metastatic lung cancer progression at this time. -no indication for additional palliative systemic treatment for metastatic lung cancer at this time. -Discussed pt labwork today 01/11/18 and her most recent CT which showed no new progressive or metastatic disease. Her bloodwork looks good today.  -We will have her port flushed today  -Discussed what we might do if her cancer ever becomes problematic again, in response to her questions regarding this. Encouraged her to continue making the most of her life.  -  Continue lovenox and once per month Xgeva and sandostatin"  Port catheter in place Intact Flushed last week at oncology  New onset a-fib [facility]) Denies current cardiac sx's  DVT of lower extremity, bilateral ([place]) Currently on enoxaparin 19m daily  She denies prolonged bleeding times    FOLLOW-UP:  Return in about 3 months (around 04/14/2018) for CPE.

## 2018-01-10 NOTE — Progress Notes (Signed)
[name]    HEMATOLOGY ONCOLOGY PROGRESS NOTE  Date of service: 01/11/18    Patient Care Team: Tamsen Roers, MD as PCP - General (Family Medicine) [name], MD as Consulting Physician (Hematology and Oncology)  CHIEF COMPLAINTS/PURPOSE OF CONSULTATION: Follow-up for metastatic lung cancer  DIAGNOSIS:   #1 Metastatic non-small cell lung cancer with bilateral lung nodules and large metastatic lesion in the left Ilium. #2 Barrett's esophagus with some evidence of intramucosal adenocarcinoma of the esophagus. 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 Also had c diff colitis - treated   Current Treatment  1) Active surveillance 2) Xgeva 178m [place] q4weeks for bone metastases. 3) Sandostatin q4weeks for diarrhea - immune colitis  Previous Treatment  1 Palliative radiation therapy to the large left ilium metastases 2. IV Nivolumab x 20ycles (discontinued due to likely immune colitis) 3. Xgeva 1275mSC q4weeks for bone metastases.   HISTORY OF PRESENTING ILLNESS: (plz see my previous consultation for details of initial presentation)  INTERVAL HISTORY  Ms ToAmisons here for her scheduled follow-up for metastatic lung cancer. The patient's last visit with [place] on 11/16/17. The pt reports that she is doing well overall and very much enjoyed her holidays. She notes no new bothersome symptoms, but that she has experienced a little SOB recently unaccompanied by CP.   Of note since the patient last visit, pt has had CT Chest, Abdomen and Pelvis w contrast completed on 01/04/18 with results revealing 1. No new or progressive metastatic disease. 2. Stable linear scar-like left lower lobe opacity. 3. Stable large left iliac bone metastasis with associated chronic pathologic fracture. 4. Chronic findings include: Aortic Atherosclerosis (ICD10-I70.0) and Emphysema (ICD10-J43.9). Coronary atherosclerosis.  Small hiatal hernia. Moderate sigmoid diverticulosis.  Lab results today stable  On review of systems, pt reports eating well, wanted weight gain, mild swollen ankles (on days that she's on her feet a lot), regular bowel movements, and denies hip pain, new bone pains, leg swelling.    MEDICAL HISTORY:  Past Medical History:  Diagnosis Date  . Barrett's esophagus   . Bone neoplasm 06/24/2015  . [facility]   metastatic poorly differentiated carcinoma. tumor left groin surgical removal with radiation tx.  . Cataract    BILATERAL  . Cigarette smoker two packs a day or less    Currently still smoking 2 PPD - Not interested in quitting at this time.  . Colitis 2017  . Colon polyps    hyperplastic, tubular adenomas, tubulovillous adenoma  . Cough, persistent    hx. lung cancer ? primary-being evaluated, unsure of primary site.  . Depression 06/24/2015  . Diverticulosis   . Emphysema of lung ([facility]  . Endometriosis    Hysterectomy with BSO at age [age]rs  . [facility] [place]   intramucosal  . Gastritis   . GERD (gastroesophageal reflux disease)   . H/O: pneumonia   . Hiatal hernia   . Hyperlipidemia   . Hypertension 06/24/2015   likely improved incidental to 40 lbs weight loss from her neoplasm. No Longer taking med for this as of 08-06-15  . IBS (irritable bowel syndrome)   . Pain    left hip-persistent"tumor of bone"-radiation tx. 10.  . Vitamin D deficiency disease    SURGICAL HISTORY: Past Surgical History:  Procedure Laterality Date  . BARTHOLIN GLAND CYST EXCISION  5226o ago   Does not want if it was  an infected cyst or tumor. Was soon as delivery  . COLONOSCOPY W/ POLYPECTOMY     multiple times - last done 09/2014 per patient.  . ESOPHAGOGASTRODUODENOSCOPY (EGD) WITH PROPOFOL N/A 08/11/2015   Procedure: ESOPHAGOGASTRODUODENOSCOPY (EGD) WITH PROPOFOL;  Surgeon: [name], MD;  Location: [facility];  Service: Gastroenterology;  Laterality: N/A;  .  FLEXIBLE SIGMOIDOSCOPY N/A 06/24/2017   Procedure: FLEXIBLE SIGMOIDOSCOPY;  Surgeon: Manus Gunning, MD;  Location: [facility];  Service: Gastroenterology;  Laterality: N/A;  . GANGLION CYST EXCISION    . KNEE ARTHROSCOPY  age about 21 yrs  . TONSILLECTOMY    . TOTAL ABDOMINAL HYSTERECTOMY W/ BILATERAL SALPINGOOPHORECTOMY  at age 79 yrs   For endometriosis    SOCIAL HISTORY: Social History   Socioeconomic History  . Marital status: Widowed    Spouse name: Not on file  . Number of children: 2  . Years of education: Not on file  . Highest education level: Not on file  Social Needs  . Financial resource strain: Not on file  . Food insecurity - worry: Not on file  . Food insecurity - inability: Not on file  . Transportation needs - medical: Not on file  . Transportation needs - non-medical: Not on file  Occupational History  . Not on file  Tobacco Use  . Smoking status: Former Smoker    Packs/day: 1.00    Years: 60.00    Pack years: 60.00    Types: Cigarettes    Last attempt to quit: 12/06/2015    Years since quitting: 2.1  . Smokeless tobacco: Never Used  Substance and Sexual Activity  . Alcohol use: No    Alcohol/week: 0.0 oz  . Drug use: No  . Sexual activity: No  Other Topics Concern  . Not on file  Social History Narrative  . Not on file    FAMILY HISTORY: Family History  Problem Relation Age of Onset  . Colon cancer Brother   . Colon cancer Brother   . Stroke Mother   . Colon cancer Father   . Breast cancer Daughter 87       ER/PR+ stage II    ALLERGIES:  is allergic to penicillins; remeron [mirtazapine]; and latex. patient wonders if she has a penicillin allergy but notes that she is uncertain about this.  MEDICATIONS:  Current Outpatient Medications  Medication Sig Dispense Refill  . amLODipine (NORVASC) 10 MG tablet Take 1 tablet (10 mg total) by mouth daily. 30 tablet 2  . calcium-vitamin D (OSCAL WITH D) 500-200 MG-UNIT tablet Take 2  tablets by mouth 3 (three) times daily.    [name] enoxaparin (LOVENOX) 80 MG/0.8ML injection Inject 0.8 mLs (80 mg total) into the skin daily. 30 Syringe 2  . lidocaine-prilocaine (EMLA) cream Apply small amount over port 1-2 hours prior to treatment, cover with plastic wrap (DO NOT RUB IN). 30 g prn  . LORazepam (ATIVAN) 1 MG tablet Take 1 tablet (1 mg total) by mouth every 8 (eight) hours as needed for anxiety (or nausea). 30 tablet 0  . omeprazole (PRILOSEC) 40 MG capsule TAKE 1 CAPSULE BY MOUTH EVERY DAY 90 capsule 0  . ondansetron (ZOFRAN) 8 MG tablet Take 1 tablet (8 mg total) by mouth every 8 (eight) hours as needed for nausea or vomiting. 60 tablet 0  . oxyCODONE-acetaminophen (PERCOCET/ROXICET) 5-325 MG tablet Take 1 tablet by mouth every 4 (four) hours as needed for severe pain. 30 tablet 0  . saccharomyces boulardii (FLORASTOR) 250  MG capsule Take 1 capsule (250 mg total) by mouth 2 (two) times daily. 60 capsule 0  . Vitamin D, Ergocalciferol, (DRISDOL) 50000 units CAPS capsule Take 50,000 Units by mouth every 7 (seven) days.     No current facility-administered medications for this visit.     REVIEW OF SYSTEMS:    10 point review of systems done and was noted to be negative except as noted above.  PHYSICAL EXAMINATION: ECOG PERFORMANCE STATUS: 2 - Symptomatic, <50% confined to bed  Vitals:   01/11/18 1352  BP: 127/64  Pulse: 74  Resp: 20  Temp: 98.7 F (37.1 C)  SpO2: 98%   Filed Weights   01/11/18 1352  Weight: 147 lb 11.2 oz (67 kg)  . [name] Wt Readings from Last 3 Encounters:  01/11/18 147 lb 11.2 oz (67 kg)  11/16/17 144 lb 11.2 oz (65.6 kg)  09/18/17 144 lb 6.4 oz (65.5 kg)    GENERAL:alert, NAD, appears well. SKIN: skin color, texture, turgor are normal, no rashes or significant lesions EYES: normal, conjunctiva are pink and non-injected, sclera clear OROPHARYNX:no exudate, no erythema and lips, buccal mucosa, and tongue normal  NECK: supple, thyroid normal size,  non-tender, without nodularity LYMPH:  no palpable lymphadenopathy in the cervical, axillary or inguinal LUNGS: clear to auscultation and bilateral reduced breath sounds with normal respiratory effort  HEART: regular rate & rhythm and no murmurs and resolved lower extremity edema ABDOMEN:abdomen soft, non-tender and normal bowel sounds Musculoskeletal: No pedal edema. No calf pain or tenderness.  PSYCH: alert & oriented x 3 with fluent speech NEURO: no focal motor/sensory deficits.  LABORATORY DATA:  I have reviewed the data as listed  . CBC Latest Ref Rng & Units 01/11/2018 11/16/2017 10/16/2017  WBC 3.9 - 10.3 K/uL 7.2 6.6 8.6  Hemoglobin 11.6 - 15.9 g/dL - 11.5(L) 11.7  Hematocrit 34.8 - 46.6 % 36.8 37.1 37.7  Platelets 145 - 400 K/uL 175 182 207    . CMP Latest Ref Rng & Units 01/11/2018 12/14/2017 11/16/2017  Glucose 70 - 140 mg/dL 78 108 149(H)  BUN 7 - 26 mg/dL 17 15 12.6  Creatinine 0.60 - 1.10 mg/dL 0.97 1.03 0.9  Sodium 136 - 145 mmol/L 140 141 141  Potassium 3.5 - 5.1 mmol/L 4.1 3.9 3.6  Chloride 98 - 109 mmol/L 106 111(H) -  CO2 22 - 29 mmol/L 26 24 28   Calcium 8.4 - 10.4 mg/dL 9.3 9.1 8.9  Total Protein 6.4 - 8.3 g/dL 7.9 7.9 7.8  Total Bilirubin 0.2 - 1.2 mg/dL 0.3 0.3 0.36  Alkaline Phos 40 - 150 U/L 73 78 72  AST 5 - 34 U/L 31 22 43(H)  ALT 0 - 55 U/L 22 15 27       RADIOGRAPHIC STUDIES:  .Ct Chest W Contrast  Result Date: 01/04/2018 CLINICAL DATA:  Stage IV non-small-cell lung cancer with bone metastasis diagnosed July 2016. History of palliative radiation therapy to the left iliac bone metastasis. Patient presents for restaging. EXAM: CT CHEST, ABDOMEN, AND PELVIS WITH CONTRAST TECHNIQUE: Multidetector CT imaging of the chest, abdomen and pelvis was performed following the standard protocol during bolus administration of intravenous contrast. CONTRAST:  100 cc Isovue-300 IV. COMPARISON:  09/14/2017 CT chest, abdomen and pelvis. FINDINGS: CT CHEST FINDINGS  Cardiovascular: Normal heart size. No significant pericardial fluid/thickening. Left main coronary atherosclerosis. Right internal jugular MediPort terminates in the lower third of the superior vena cava. Atherosclerotic nonaneurysmal thoracic aorta. Stable top-normal caliber main pulmonary artery (3.3 cm  diameter). No central pulmonary emboli. Stable chronic high-grade narrowing in the left lower lobe pulmonary arterial tree. Mediastinum/Nodes: No discrete thyroid nodules. Unremarkable esophagus. No pathologically enlarged axillary, mediastinal or hilar lymph nodes. Lungs/Pleura: No pneumothorax. No pleural effusion. Mild-to-moderate centrilobular emphysema with diffuse bronchial wall thickening. Peripheral right lower lobe 4 mm solid pulmonary nodule (series 6/image 103), stable back to 07/01/2015, probably benign. Stable linear scar-like opacity measuring 3 mm in thickness in the left lower lobe (series 6/image 84). Stable scattered parenchymal bands in mid to lower lungs compatible with postinfectious/postinflammatory scarring. No acute consolidative airspace disease, lung masses or new significant pulmonary nodules. Musculoskeletal: No aggressive appearing focal osseous lesions. Stable healed deformities in multiple posterior lower left ribs. Mild thoracic spondylosis. CT ABDOMEN PELVIS FINDINGS Hepatobiliary: Normal liver size. Stable small scattered simple liver cysts, largest 1.6 cm in the segment 4B left liver lobe. Several additional scattered subcentimeter hypodense lesions throughout the liver are too small to characterize and appear stable, considered benign. No new liver lesions. Normal gallbladder with no radiopaque cholelithiasis. No biliary ductal dilatation. Pancreas: Normal, with no mass or duct dilation. Spleen: Normal size. No mass. Adrenals/Urinary Tract: Normal adrenals. Normal kidneys with no hydronephrosis and no renal mass. Normal bladder. Stomach/Bowel: Small hiatal hernia. Otherwise  normal nondistended stomach. Normal caliber small bowel with no small bowel wall thickening. Normal appendix. Oral contrast transits to the left colon. Moderate sigmoid diverticulosis, with no large bowel wall thickening or pericolonic fat stranding. Vascular/Lymphatic: Atherosclerotic nonaneurysmal abdominal aorta. Patent portal, splenic, hepatic and renal veins. No pathologically enlarged lymph nodes in the abdomen or pelvis. Stable top-normal 0.8 cm left common iliac node (series 2/image 89). Reproductive: Status post hysterectomy, with no abnormal findings at the vaginal cuff. No adnexal mass. Other: No pneumoperitoneum, ascites or focal fluid collection. Musculoskeletal: Large mildly expansile mixed lytic and sclerotic left iliac bone lesion measures approximately 11.6 cm, stable using similar measurement technique (series 2/image 97), with associated chronic pathologic fracture extending to the left sacroiliac joint. No new focal osseous lesions. Mild-to-moderate lower lumbar spondylosis. IMPRESSION: 1. No new or progressive metastatic disease. 2. Stable linear scar-like left lower lobe opacity. 3. Stable large left iliac bone metastasis with associated chronic pathologic fracture. 4. Chronic findings include: Aortic Atherosclerosis (ICD10-I70.0) and Emphysema (ICD10-J43.9). Coronary atherosclerosis. Small hiatal hernia. Moderate sigmoid diverticulosis. Electronically Signed   By: Ilona Sorrel M.D.   On: 01/04/2018 09:49   Ct Abdomen Pelvis W Contrast  Result Date: 01/04/2018 CLINICAL DATA:  Stage IV non-small-cell lung cancer with bone metastasis diagnosed July 2016. History of palliative radiation therapy to the left iliac bone metastasis. Patient presents for restaging. EXAM: CT CHEST, ABDOMEN, AND PELVIS WITH CONTRAST TECHNIQUE: Multidetector CT imaging of the chest, abdomen and pelvis was performed following the standard protocol during bolus administration of intravenous contrast. CONTRAST:  100 cc  Isovue-300 IV. COMPARISON:  09/14/2017 CT chest, abdomen and pelvis. FINDINGS: CT CHEST FINDINGS Cardiovascular: Normal heart size. No significant pericardial fluid/thickening. Left main coronary atherosclerosis. Right internal jugular MediPort terminates in the lower third of the superior vena cava. Atherosclerotic nonaneurysmal thoracic aorta. Stable top-normal caliber main pulmonary artery (3.3 cm diameter). No central pulmonary emboli. Stable chronic high-grade narrowing in the left lower lobe pulmonary arterial tree. Mediastinum/Nodes: No discrete thyroid nodules. Unremarkable esophagus. No pathologically enlarged axillary, mediastinal or hilar lymph nodes. Lungs/Pleura: No pneumothorax. No pleural effusion. Mild-to-moderate centrilobular emphysema with diffuse bronchial wall thickening. Peripheral right lower lobe 4 mm solid pulmonary nodule (series 6/image 103), stable back  to 07/01/2015, probably benign. Stable linear scar-like opacity measuring 3 mm in thickness in the left lower lobe (series 6/image 84). Stable scattered parenchymal bands in mid to lower lungs compatible with postinfectious/postinflammatory scarring. No acute consolidative airspace disease, lung masses or new significant pulmonary nodules. Musculoskeletal: No aggressive appearing focal osseous lesions. Stable healed deformities in multiple posterior lower left ribs. Mild thoracic spondylosis. CT ABDOMEN PELVIS FINDINGS Hepatobiliary: Normal liver size. Stable small scattered simple liver cysts, largest 1.6 cm in the segment 4B left liver lobe. Several additional scattered subcentimeter hypodense lesions throughout the liver are too small to characterize and appear stable, considered benign. No new liver lesions. Normal gallbladder with no radiopaque cholelithiasis. No biliary ductal dilatation. Pancreas: Normal, with no mass or duct dilation. Spleen: Normal size. No mass. Adrenals/Urinary Tract: Normal adrenals. Normal kidneys with no  hydronephrosis and no renal mass. Normal bladder. Stomach/Bowel: Small hiatal hernia. Otherwise normal nondistended stomach. Normal caliber small bowel with no small bowel wall thickening. Normal appendix. Oral contrast transits to the left colon. Moderate sigmoid diverticulosis, with no large bowel wall thickening or pericolonic fat stranding. Vascular/Lymphatic: Atherosclerotic nonaneurysmal abdominal aorta. Patent portal, splenic, hepatic and renal veins. No pathologically enlarged lymph nodes in the abdomen or pelvis. Stable top-normal 0.8 cm left common iliac node (series 2/image 89). Reproductive: Status post hysterectomy, with no abnormal findings at the vaginal cuff. No adnexal mass. Other: No pneumoperitoneum, ascites or focal fluid collection. Musculoskeletal: Large mildly expansile mixed lytic and sclerotic left iliac bone lesion measures approximately 11.6 cm, stable using similar measurement technique (series 2/image 97), with associated chronic pathologic fracture extending to the left sacroiliac joint. No new focal osseous lesions. Mild-to-moderate lower lumbar spondylosis. IMPRESSION: 1. No new or progressive metastatic disease. 2. Stable linear scar-like left lower lobe opacity. 3. Stable large left iliac bone metastasis with associated chronic pathologic fracture. 4. Chronic findings include: Aortic Atherosclerosis (ICD10-I70.0) and Emphysema (ICD10-J43.9). Coronary atherosclerosis. Small hiatal hernia. Moderate sigmoid diverticulosis. Electronically Signed   By: Ilona Sorrel M.D.   On: 01/04/2018 09:49      ASSESSMENT & PLAN:   #1 Metastatic poorly differentiated carcinoma with likely lung primary [non-small cell lung cancer].  CT of the head with and without contrast showed no evidence of metastatic disease. Patient notes much improved pain control. EGFR blood test mutation analysis negative. Patient's pain is much better controlled after radiation for the painful ilium met. CT chest  abdomen pelvis 04/19/2016 shows no evidence of disease progression. Patient tolerated Nivolumab very well but was discontinued when she developed grade 2 Immune colitis. Has been off Nivolumab for >6 months  CT chest abdomen pelvis on 06/24/2016 shows no evidence of new disease or progression of metastatic disease. CT chest abdomen pelvis 09/06/2016 shows 1. Mixed interval response to therapy. 2. There is a new left ventral chest wall lesion deep to the pectoralis musculature worrisome for metastatic disease. 3. Posterior lower lobe nodular densities are identified which may reflect areas of pulmonary metastasis. 4. Interval decrease in size of destructive lesion involving the left iliac bone.  CT chest abd pelvis 12/08/2016: Cystic mass involving the left ventral chest wall has resolved in the interval. Likely was a hematoma due to trauma. Interval increase in size of pleural base mass overlying the posterior and inferior left lower lobe. There is also a new left pleural effusion identified.  CT chest 02/01/2017: Residual irregular soft tissue thickening/volume loss and trace left pleural fluid at the base of the left hemithorax, overall improved  in appearance from 12/08/2016. No measurable lesion.  CT chest 05/29/2017 shows no residual pleural based mass or significant pleural effusion in the left hemithorax. No evidence of thoracic metastatic disease. No evidence of progressive metastatic disease within the abdomen or pelvis. Mixed lytic and blastic lesion involving the left iliac bone and associated pathologic fracture are unchanged.   CT CAP 09/14/17 shows no new changes. She does have slight displacement of her fractured left iliac bone. Evidence of stable disease.   PLAN -patient has no overt clinical evidence of metastatic lung cancer progression at this time. -no indication for additional palliative systemic treatment for metastatic lung cancer at this time. -Discussed pt labwork today 01/11/18 and  her most recent CT which showed no new progressive or metastatic disease. Her bloodwork looks good today.  -We will have her port flushed today  -Discussed what we might do if her cancer ever becomes problematic again, in response to her questions regarding this. Encouraged her to continue making the most of her life.  -Continue lovenox and once per month Xgeva and sandostatin.   #2 diarrhea-  now resolved was previously. S/p grade 2 likely related to immune colitis from her Nivolumab and also had c diff colitis (s/p vancomycin) and possible underlying IBD She was previously on on Lialda, budesonide,probiotics and lomotil but not currently taking any of these.  #3 h/o diverticulitis and c dff colitis - now resolved Plan  -continue on sandostatin today and q4weeks.  -continue on Lialda, she has stopped. She will follow up with Dr. [name] to see if she should restart Lialda.   #4 DVT and PE  -will need to be on ongoing Lovenox therapy -Monitor for bleeding .  #5 Barrett's esophagus 4cms in the distal esophagus with low and high-grade dysplasia cannot rule out an early intra-mucosal esophageal adenocarcinoma. Plan -Patient being monitored by Dr. [name] from GI. -patient notes that she does not want to f/u at [facility] for further intervention as per GI plan.  #6 Abnormal LFTs - normalized on rpt. No overt pathology on CT abd/pelvis.  Port flush q2 months Continue Sandostatin and Marchelle Folks RTC with Dr Irene Limbo in 2 months with labs  All questions were answered. The patient knows to call the clinic with any problems, questions or concerns. I spent 20 minutes counseling the pt face to face. The total time spent in the appt was 20 minutes and more than 50% was on counseling and direct patient cares.  Sullivan Lone MD MS Hematology/Oncology Physician [facility]  (Office): [phone] (Work cell): [phone] (Fax): [phone]  This document  serves as a record of services personally performed by Sullivan Lone, MD. It was created on his behalf by Baldwin Jamaica, a trained medical scribe. The creation of this record is based on the scribe's personal observations and the provider's statements to them.   .I have reviewed the above documentation for accuracy and completeness, and I agree with the above.

## 2018-01-11 ENCOUNTER — Encounter: Payer: Self-pay | Admitting: Hematology

## 2018-01-11 ENCOUNTER — Inpatient Hospital Stay: Payer: Medicare Other | Attending: Hematology | Admitting: Hematology

## 2018-01-11 ENCOUNTER — Ambulatory Visit: Payer: Medicare Other

## 2018-01-11 ENCOUNTER — Inpatient Hospital Stay: Payer: Medicare Other

## 2018-01-11 ENCOUNTER — Telehealth: Payer: Self-pay | Admitting: Hematology

## 2018-01-11 VITALS — BP 127/64 | HR 74 | Temp 98.7°F | Resp 20 | Ht 68.0 in | Wt 147.7 lb

## 2018-01-11 DIAGNOSIS — C3490 Malignant neoplasm of unspecified part of unspecified bronchus or lung: Secondary | ICD-10-CM

## 2018-01-11 DIAGNOSIS — C7951 Secondary malignant neoplasm of bone: Secondary | ICD-10-CM | POA: Diagnosis not present

## 2018-01-11 DIAGNOSIS — C3491 Malignant neoplasm of unspecified part of right bronchus or lung: Secondary | ICD-10-CM | POA: Diagnosis not present

## 2018-01-11 DIAGNOSIS — I82409 Acute embolism and thrombosis of unspecified deep veins of unspecified lower extremity: Secondary | ICD-10-CM | POA: Diagnosis not present

## 2018-01-11 DIAGNOSIS — K2270 Barrett's esophagus without dysplasia: Secondary | ICD-10-CM | POA: Diagnosis not present

## 2018-01-11 DIAGNOSIS — I2699 Other pulmonary embolism without acute cor pulmonale: Secondary | ICD-10-CM | POA: Diagnosis not present

## 2018-01-11 DIAGNOSIS — C3412 Malignant neoplasm of upper lobe, left bronchus or lung: Secondary | ICD-10-CM | POA: Insufficient documentation

## 2018-01-11 DIAGNOSIS — Z87891 Personal history of nicotine dependence: Secondary | ICD-10-CM

## 2018-01-11 DIAGNOSIS — Z95828 Presence of other vascular implants and grafts: Secondary | ICD-10-CM

## 2018-01-11 LAB — CMP (CANCER CENTER ONLY)
ALK PHOS: 73 U/L (ref 40–150)
ALT: 22 U/L (ref 0–55)
ANION GAP: 8 (ref 3–11)
AST: 31 U/L (ref 5–34)
Albumin: 3.4 g/dL — ABNORMAL LOW (ref 3.5–5.0)
BUN: 17 mg/dL (ref 7–26)
CALCIUM: 9.3 mg/dL (ref 8.4–10.4)
CO2: 26 mmol/L (ref 22–29)
Chloride: 106 mmol/L (ref 98–109)
Creatinine: 0.97 mg/dL (ref 0.60–1.10)
GFR, Estimated: 55 mL/min — ABNORMAL LOW (ref >60)
Glucose, Bld: 78 mg/dL (ref 70–140)
Potassium: 4.1 mmol/L (ref 3.5–5.1)
SODIUM: 140 mmol/L (ref 136–145)
TOTAL PROTEIN: 7.9 g/dL (ref 6.4–8.3)
Total Bilirubin: 0.3 mg/dL (ref 0.2–1.2)

## 2018-01-11 LAB — CBC WITH DIFFERENTIAL (CANCER CENTER ONLY)
BASOS ABS: 0.1 10*3/uL (ref 0.0–0.1)
Basophils Relative: 1 %
EOS ABS: 0.2 10*3/uL (ref 0.0–0.5)
Eosinophils Relative: 3 %
HCT: 36.8 % (ref 34.8–46.6)
Hemoglobin: 11.7 g/dL (ref 11.6–15.9)
Lymphocytes Relative: 23 %
Lymphs Abs: 1.7 10*3/uL (ref 0.9–3.3)
MCH: 28.4 pg (ref 25.1–34.0)
MCHC: 31.8 g/dL (ref 31.5–36.0)
MCV: 89.3 fL (ref 79.5–101.0)
Monocytes Absolute: 0.8 10*3/uL (ref 0.1–0.9)
Monocytes Relative: 11 %
Neutro Abs: 4.4 10*3/uL (ref 1.5–6.5)
Neutrophils Relative %: 62 %
PLATELETS: 175 10*3/uL (ref 145–400)
RBC: 4.12 MIL/uL (ref 3.70–5.45)
RDW: 14.8 % — ABNORMAL HIGH (ref 11.2–14.5)
WBC Count: 7.2 10*3/uL (ref 3.9–10.3)

## 2018-01-11 LAB — RETICULOCYTES
RBC.: 4.12 MIL/uL (ref 3.70–5.45)
RETIC CT PCT: 1.7 % (ref 0.7–2.1)
Retic Count, Absolute: 70 10*3/uL (ref 33.7–90.7)

## 2018-01-11 MED ORDER — OMEPRAZOLE 40 MG PO CPDR: DELAYED_RELEASE_CAPSULE | ORAL | 0 refills | Status: DC

## 2018-01-11 MED ORDER — DENOSUMAB 120 MG/1.7ML ~~LOC~~ SOLN
SUBCUTANEOUS | Status: AC
  Filled 2018-01-11: qty 1.7

## 2018-01-11 MED ORDER — DENOSUMAB 120 MG/1.7ML ~~LOC~~ SOLN
120.0000 mg | Freq: Once | SUBCUTANEOUS | Status: AC
  Administered 2018-01-11: 120 mg via SUBCUTANEOUS

## 2018-01-11 MED ORDER — OCTREOTIDE ACETATE 30 MG IM KIT
PACK | INTRAMUSCULAR | Status: AC
  Filled 2018-01-11: qty 1

## 2018-01-11 MED ORDER — OCTREOTIDE ACETATE 30 MG IM KIT
30.0000 mg | PACK | Freq: Once | INTRAMUSCULAR | Status: AC
  Administered 2018-01-11: 30 mg via INTRAMUSCULAR

## 2018-01-11 NOTE — Telephone Encounter (Signed)
Gave avs and calendar for march and april °

## 2018-01-11 NOTE — Progress Notes (Signed)
May give Xgeva on 01/11/2018 with [facility]  Results pending

## 2018-01-11 NOTE — Patient Instructions (Signed)
Octreotide injection solution What is this medicine? OCTREOTIDE (ok TREE oh tide) is used to reduce blood levels of growth hormone in patients with a condition called acromegaly. This medicine also reduces flushing and watery diarrhea caused by certain types of cancer. This medicine may be used for other purposes; ask your health care provider or pharmacist if you have questions. COMMON BRAND NAME(S): Sandostatin, Sandostatin LAR What should I tell my health care provider before I take this medicine? They need to know if you have any of these conditions: -gallbladder disease -kidney disease -liver disease -an unusual or allergic reaction to octreotide, other medicines, foods, dyes, or preservatives -pregnant or trying to get pregnant -breast-feeding How should I use this medicine? This medicine is for injection under the skin or into a vein (only in emergency situations). It is usually given by a health care professional in a hospital or clinic setting. If you get this medicine at home, you will be taught how to prepare and give this medicine. Allow the injection solution to come to room temperature before use. Do not warm it artificially. Use exactly as directed. Take your medicine at regular intervals. Do not take your medicine more often than directed. It is important that you put your used needles and syringes in a special sharps container. Do not put them in a trash can. If you do not have a sharps container, call your pharmacist or healthcare provider to get one. Talk to your pediatrician regarding the use of this medicine in children. Special care may be needed. Overdosage: If you think you have taken too much of this medicine contact a poison control center or emergency room at once. NOTE: This medicine is only for you. Do not share this medicine with others. What if I miss a dose? If you miss a dose, take it as soon as you can. If it is almost time for your next dose, take only that  dose. Do not take double or extra doses. What may interact with this medicine? Do not take this medicine with any of the following medications: -cisapride -droperidol -general anesthetics -grepafloxacin -perphenazine -thioridazine This medicine may also interact with the following medications: -bromocriptine -cyclosporine -diuretics -medicines for blood pressure, heart disease, irregular heart beat -medicines for diabetes, including insulin -quinidine This list may not describe all possible interactions. Give your health care provider a list of all the medicines, herbs, non-prescription drugs, or dietary supplements you use. Also tell them if you smoke, drink alcohol, or use illegal drugs. Some items may interact with your medicine. What should I watch for while using this medicine? Visit your doctor or health care professional for regular checks on your progress. To help reduce irritation at the injection site, use a different site for each injection and make sure the solution is at room temperature before use. This medicine may cause increases or decreases in blood sugar. Signs of high blood sugar include frequent urination, unusual thirst, flushed or dry skin, difficulty breathing, drowsiness, stomach ache, nausea, vomiting or dry mouth. Signs of low blood sugar include chills, cool, pale skin or cold sweats, drowsiness, extreme hunger, fast heartbeat, headache, nausea, nervousness or anxiety, shakiness, trembling, unsteadiness, tiredness, or weakness. Contact your doctor or health care professional right away if you experience any of these symptoms. What side effects may I notice from receiving this medicine? Side effects that you should report to your doctor or health care professional as soon as possible: -allergic reactions like skin rash, itching or hives, swelling   of the face, lips, or tongue -changes in blood sugar -changes in heart rate -severe stomach pain Side effects that  usually do not require medical attention (report to your doctor or health care professional if they continue or are bothersome): -diarrhea or constipation -gas or stomach pain -nausea, vomiting -pain, redness, swelling and irritation at site where injected This list may not describe all possible side effects. Call your doctor for medical advice about side effects. You may report side effects to FDA at 1-800-FDA-1088. Where should I keep my medicine? Keep out of the reach of children. Store in a refrigerator between 2 and 8 degrees C (36 and 46 degrees F). Protect from light. Allow to come to room temperature naturally. Do not use artificial heat. If protected from light, the injection may be stored at room temperature between 20 and 30 degrees C (70 and 86 degrees F) for 14 days. After the initial use, throw away any unused portion of a multiple dose vial after 14 days. Throw away unused portions of the ampules after use. NOTE: This sheet is a summary. It may not cover all possible information. If you have questions about this medicine, talk to your doctor, pharmacist, or health care provider.  2018 Elsevier/Gold Standard (2008-06-17 16:56:04)  Denosumab injection What is this medicine? DENOSUMAB (den oh sue mab) slows bone breakdown. Prolia is used to treat osteoporosis in women after menopause and in men. Delton See is used to treat a high calcium level due to cancer and to prevent bone fractures and other bone problems caused by multiple myeloma or cancer bone metastases. Delton See is also used to treat giant cell tumor of the bone. This medicine may be used for other purposes; ask your health care provider or pharmacist if you have questions. COMMON BRAND NAME(S): Prolia, XGEVA What should I tell my health care provider before I take this medicine? They need to know if you have any of these conditions: -dental disease -having surgery or tooth extraction -infection -kidney disease -low levels of  calcium or Vitamin D in the blood -malnutrition -on hemodialysis -skin conditions or sensitivity -thyroid or parathyroid disease -an unusual reaction to denosumab, other medicines, foods, dyes, or preservatives -pregnant or trying to get pregnant -breast-feeding How should I use this medicine? This medicine is for injection under the skin. It is given by a health care professional in a hospital or clinic setting. If you are getting Prolia, a special MedGuide will be given to you by the pharmacist with each prescription and refill. Be sure to read this information carefully each time. For Prolia, talk to your pediatrician regarding the use of this medicine in children. Special care may be needed. For Delton See, talk to your pediatrician regarding the use of this medicine in children. While this drug may be prescribed for children as young as 13 years for selected conditions, precautions do apply. Overdosage: If you think you have taken too much of this medicine contact a poison control center or emergency room at once. NOTE: This medicine is only for you. Do not share this medicine with others. What if I miss a dose? It is important not to miss your dose. Call your doctor or health care professional if you are unable to keep an appointment. What may interact with this medicine? Do not take this medicine with any of the following medications: -other medicines containing denosumab This medicine may also interact with the following medications: -medicines that lower your chance of fighting infection -steroid medicines like prednisone  or cortisone This list may not describe all possible interactions. Give your health care provider a list of all the medicines, herbs, non-prescription drugs, or dietary supplements you use. Also tell them if you smoke, drink alcohol, or use illegal drugs. Some items may interact with your medicine. What should I watch for while using this medicine? Visit your doctor or  health care professional for regular checks on your progress. Your doctor or health care professional may order blood tests and other tests to see how you are doing. Call your doctor or health care professional for advice if you get a fever, chills or sore throat, or other symptoms of a cold or flu. Do not treat yourself. This drug may decrease your body's ability to fight infection. Try to avoid being around people who are sick. You should make sure you get enough calcium and vitamin D while you are taking this medicine, unless your doctor tells you not to. Discuss the foods you eat and the vitamins you take with your health care professional. See your dentist regularly. Brush and floss your teeth as directed. Before you have any dental work done, tell your dentist you are receiving this medicine. Do not become pregnant while taking this medicine or for 5 months after stopping it. Talk with your doctor or health care professional about your birth control options while taking this medicine. Women should inform their doctor if they wish to become pregnant or think they might be pregnant. There is a potential for serious side effects to an unborn child. Talk to your health care professional or pharmacist for more information. What side effects may I notice from receiving this medicine? Side effects that you should report to your doctor or health care professional as soon as possible: -allergic reactions like skin rash, itching or hives, swelling of the face, lips, or tongue -bone pain -breathing problems -dizziness -jaw pain, especially after dental work -redness, blistering, peeling of the skin -signs and symptoms of infection like fever or chills; cough; sore throat; pain or trouble passing urine -signs of low calcium like fast heartbeat, muscle cramps or muscle pain; pain, tingling, numbness in the hands or feet; seizures -unusual bleeding or bruising -unusually weak or tired Side effects that  usually do not require medical attention (report to your doctor or health care professional if they continue or are bothersome): -constipation -diarrhea -headache -joint pain -loss of appetite -muscle pain -runny nose -tiredness -upset stomach This list may not describe all possible side effects. Call your doctor for medical advice about side effects. You may report side effects to FDA at 1-800-FDA-1088. Where should I keep my medicine? This medicine is only given in a clinic, doctor's office, or other health care setting and will not be stored at home. NOTE: This sheet is a summary. It may not cover all possible information. If you have questions about this medicine, talk to your doctor, pharmacist, or health care provider.  2018 Elsevier/Gold Standard (2016-12-13 19:17:21)   

## 2018-01-11 NOTE — Patient Instructions (Signed)
Thank you for choosing [facility] to provide your oncology and hematology care.  To afford each patient quality time with our providers, please arrive 30 minutes before your scheduled appointment time.  If you arrive late for your appointment, you may be asked to reschedule.  We strive to give you quality time with our providers, and arriving late affects you and other patients whose appointments are after yours.   If you are a no show for multiple scheduled visits, you may be dismissed from the clinic at the providers discretion.    Again, thank you for choosing [facility], our hope is that these requests will decrease the amount of time that you wait before being seen by our physicians.  ______________________________________________________________________  Should you have questions after your visit to the [facility], please contact our office at [phone] between the hours of 8:30 and 4:30 p.m.    Voicemails left after 4:30p.m will not be returned until the following business day.    For prescription refill requests, please have your pharmacy contact us directly.  Please also try to allow 48 hours for prescription requests.    Please contact the scheduling department for questions regarding scheduling.  For scheduling of procedures such as PET scans, CT scans, MRI, Ultrasound, etc please contact central scheduling at [phone].    Resources For Cancer Patients and Caregivers:   Oncolink.org:  A wonderful resource for patients and healthcare providers for information regarding your disease, ways to tract your treatment, what to expect, etc.     American Cancer Society:  800-227-2345  Can help patients locate various types of support and financial assistance  Cancer Care: 1-800-813-HOPE (4673) Provides financial assistance, online support groups, medication/co-pay assistance.    [place] DSS:  336-641-3447 Where to apply for food  stamps, Medicaid, and utility assistance  Medicare Rights Center: 800-333-4114 Helps people with Medicare understand their rights and benefits, navigate the Medicare system, and secure the quality healthcare they deserve  SCAT: 336-333-6589 [place] [employer]'s shared-ride transportation service for eligible riders who have a disability that prevents them from riding the fixed route bus.    For additional information on assistance programs please contact our social worker:   Grier Hock/Abigail Elmore:  [phone]            

## 2018-01-15 ENCOUNTER — Ambulatory Visit: Payer: Medicare Other | Admitting: Adult Health

## 2018-01-15 ENCOUNTER — Encounter: Payer: Self-pay | Admitting: Adult Health

## 2018-01-15 VITALS — BP 154/68 | HR 65 | Ht 67.99 in | Wt 148.0 lb

## 2018-01-15 DIAGNOSIS — I824Y3 Acute embolism and thrombosis of unspecified deep veins of proximal lower extremity, bilateral: Secondary | ICD-10-CM | POA: Diagnosis not present

## 2018-01-15 DIAGNOSIS — I4891 Unspecified atrial fibrillation: Secondary | ICD-10-CM | POA: Diagnosis not present

## 2018-01-15 DIAGNOSIS — I10 Essential (primary) hypertension: Secondary | ICD-10-CM | POA: Diagnosis not present

## 2018-01-15 DIAGNOSIS — C3490 Malignant neoplasm of unspecified part of unspecified bronchus or lung: Secondary | ICD-10-CM

## 2018-01-15 DIAGNOSIS — Z0000 Encounter for general adult medical examination without abnormal findings: Secondary | ICD-10-CM | POA: Insufficient documentation

## 2018-01-15 DIAGNOSIS — Z95828 Presence of other vascular implants and grafts: Secondary | ICD-10-CM | POA: Diagnosis not present

## 2018-01-15 MED ORDER — AMLODIPINE BESYLATE 10 MG PO TABS: 10.0000 mg | ORAL_TABLET | Freq: Every day | ORAL | 2 refills | Status: DC

## 2018-01-15 MED ORDER — OMEPRAZOLE 40 MG PO CPDR: DELAYED_RELEASE_CAPSULE | ORAL | 2 refills | Status: DC

## 2018-01-15 NOTE — Patient Instructions (Addendum)
Heart-Healthy Eating Plan Many factors influence your heart health, including eating and exercise habits. Heart (coronary) risk increases with abnormal blood fat (lipid) levels. Heart-healthy meal planning includes limiting unhealthy fats, increasing healthy fats, and making other small dietary changes. This includes maintaining a healthy body weight to help keep lipid levels within a normal range. What is my plan? Your health care provider recommends that you:  Get no more than __25___% of the total calories in your daily diet from fat.  Limit your intake of saturated fat to less than ___5__% of your total calories each day.  Limit the amount of cholesterol in your diet to less than __300___ mg per day.  What types of fat should I choose?  Choose healthy fats more often. Choose monounsaturated and polyunsaturated fats, such as olive oil and canola oil, flaxseeds, walnuts, almonds, and seeds.  Eat more omega-3 fats. Good choices include salmon, mackerel, sardines, tuna, flaxseed oil, and ground flaxseeds. Aim to eat fish at least two times each week.  Limit saturated fats. Saturated fats are primarily found in animal products, such as meats, butter, and cream. Plant sources of saturated fats include palm oil, palm kernel oil, and coconut oil.  Avoid foods with partially hydrogenated oils in them. These contain trans fats. Examples of foods that contain trans fats are stick margarine, some tub margarines, cookies, crackers, and other baked goods. What general guidelines do I need to follow?  Check food labels carefully to identify foods with trans fats or high amounts of saturated fat.  Fill one half of your plate with vegetables and green salads. Eat 4-5 servings of vegetables per day. A serving of vegetables equals 1 cup of raw leafy vegetables,  cup of raw or cooked cut-up vegetables, or  cup of vegetable juice.  Fill one fourth of your plate with whole grains. Look for the word "whole"  as the first word in the ingredient list.  Fill one fourth of your plate with lean protein foods.  Eat 4-5 servings of fruit per day. A serving of fruit equals one medium whole fruit,  cup of dried fruit,  cup of fresh, frozen, or canned fruit, or  cup of 100% fruit juice.  Eat more foods that contain soluble fiber. Examples of foods that contain this type of fiber are apples, broccoli, carrots, beans, peas, and barley. Aim to get 20-30 g of fiber per day.  Eat more home-cooked food and less restaurant, buffet, and fast food.  Limit or avoid alcohol.  Limit foods that are high in starch and sugar.  Avoid fried foods.  Cook foods by using methods other than frying. Baking, boiling, grilling, and broiling are all great options. Other fat-reducing suggestions include: ? Removing the skin from poultry. ? Removing all visible fats from meats. ? Skimming the fat off of stews, soups, and gravies before serving them. ? Steaming vegetables in water or broth.  Lose weight if you are overweight. Losing just 5-10% of your initial body weight can help your overall health and prevent diseases such as diabetes and heart disease.  Increase your consumption of nuts, legumes, and seeds to 4-5 servings per week. One serving of dried beans or legumes equals  cup after being cooked, one serving of nuts equals 1 ounces, and one serving of seeds equals  ounce or 1 tablespoon.  You may need to monitor your salt (sodium) intake, especially if you have high blood pressure. Talk with your health care provider or dietitian to get  more information about reducing sodium. What foods can I eat? Grains  Breads, including [place], white, pita, wheat, raisin, rye, oatmeal, and [place]. Tortillas that are neither fried nor made with lard or trans fat. Low-fat rolls, including hotdog and hamburger buns and English muffins. Biscuits. Muffins. Waffles. Pancakes. Light popcorn. Whole-grain cereals. Flatbread. Melba  toast. Pretzels. Breadsticks. Rusks. Low-fat snacks and crackers, including oyster, saltine, matzo, graham, animal, and rye. Rice and pasta, including brown rice and those that are made with whole wheat. Vegetables All vegetables. Fruits All fruits, but limit coconut. Meats and Other Protein Sources Lean, well-trimmed beef, veal, pork, and lamb. Chicken and [place] without skin. All fish and shellfish. Wild duck, rabbit, pheasant, and venison. Egg whites or low-cholesterol egg substitutes. Dried beans, peas, lentils, and tofu.Seeds and most nuts. Dairy Low-fat or nonfat cheeses, including ricotta, string, and mozzarella. Skim or 1% milk that is liquid, powdered, or evaporated. Buttermilk that is made with low-fat milk. Nonfat or low-fat yogurt. Beverages Mineral water. Diet carbonated beverages. Sweets and Desserts Sherbets and fruit ices. Honey, jam, marmalade, jelly, and syrups. Meringues and gelatins. Pure sugar candy, such as hard candy, jelly beans, gumdrops, mints, marshmallows, and small amounts of dark chocolate. [employer] Eat all sweets and desserts in moderation. Fats and Oils Nonhydrogenated (trans-free) margarines. Vegetable oils, including soybean, sesame, sunflower, olive, peanut, safflower, corn, canola, and cottonseed. Salad dressings or mayonnaise that are made with a vegetable oil. Limit added fats and oils that you use for cooking, baking, salads, and as spreads. Other Cocoa powder. Coffee and tea. All seasonings and condiments. The items listed above may not be a complete list of recommended foods or beverages. Contact your dietitian for more options. What foods are not recommended? Grains Breads that are made with saturated or trans fats, oils, or whole milk. Croissants. Butter rolls. Cheese breads. Sweet rolls. Donuts. Buttered popcorn. Chow mein noodles. High-fat crackers, such as cheese or butter crackers. Meats and Other Protein Sources Fatty meats, such as  hotdogs, short ribs, sausage, spareribs, bacon, ribeye roast or steak, and mutton. High-fat deli meats, such as salami and bologna. Caviar. Domestic duck and goose. Organ meats, such as kidney, liver, sweetbreads, brains, gizzard, chitterlings, and heart. Dairy Cream, sour cream, cream cheese, and creamed cottage cheese. Whole milk cheeses, including blue (bleu), Monterey Jack, [place], [place], American, [place], Swiss, [place], [place], and [place]. Whole or 2% milk that is liquid, evaporated, or condensed. Whole buttermilk. Cream sauce or high-fat cheese sauce. Yogurt that is made from whole milk. Beverages Regular sodas and drinks with added sugar. Sweets and Desserts Frosting. Pudding. Cookies. Cakes other than angel food cake. Candy that has milk chocolate or white chocolate, hydrogenated fat, butter, coconut, or unknown ingredients. Buttered syrups. Full-fat ice cream or ice cream drinks. Fats and Oils Gravy that has suet, meat fat, or shortening. Cocoa butter, hydrogenated oils, palm oil, coconut oil, palm kernel oil. These can often be found in baked products, candy, fried foods, nondairy creamers, and whipped toppings. Solid fats and shortenings, including bacon fat, salt pork, lard, and butter. Nondairy cream substitutes, such as coffee creamers and sour cream substitutes. Salad dressings that are made of unknown oils, cheese, or sour cream. The items listed above may not be a complete list of foods and beverages to avoid. Contact your dietitian for more information. This information is not intended to replace advice given to you by your health care provider. Make sure you discuss any questions you have with your health care  provider. Document Released: 08/30/2008 Document Revised: 06/10/2016 Document Reviewed: 05/15/2014 Elsevier Interactive Patient Education  [employer].   Please continue all medications as directed. Please continue regular follow-up with oncology as  directed. Please schedule fasting labs in the next few months and complete physical about a week later. WELCOME TO THE PRACTICE!

## 2018-01-15 NOTE — Assessment & Plan Note (Signed)
BP slightly above goal 154/68 Continue Norvasc 10mg  once daily She denies CP/palpiations

## 2018-01-15 NOTE — Assessment & Plan Note (Signed)
Intact Flushed last week at oncology

## 2018-01-15 NOTE — Assessment & Plan Note (Addendum)
CT/Oncology OV every 2 months Current tx, per Oncologist at 01/11/18 OV- "-patient has no overt clinical evidence of metastatic lung cancer progression at this time. -no indication for additional palliative systemic treatment for metastatic lung cancer at this time. -Discussed pt labwork today 01/11/18 and her most recent CT which showed no new progressive or metastatic disease. Her bloodwork looks good today.  -We will have her port flushed today  -Discussed what we might do if her cancer ever becomes problematic again, in response to her questions regarding this. Encouraged her to continue making the most of her life.  -Continue lovenox and once per month Xgeva and sandostatin"

## 2018-01-16 NOTE — Assessment & Plan Note (Addendum)
Denies current cardiac sx's

## 2018-01-16 NOTE — Assessment & Plan Note (Signed)
Currently on enoxaparin 80mg  daily  She denies prolonged bleeding times

## 2018-01-30 ENCOUNTER — Telehealth: Payer: Self-pay

## 2018-01-30 ENCOUNTER — Other Ambulatory Visit: Payer: Self-pay

## 2018-01-30 DIAGNOSIS — C3490 Malignant neoplasm of unspecified part of unspecified bronchus or lung: Secondary | ICD-10-CM

## 2018-01-30 NOTE — Telephone Encounter (Signed)
Pt notified of lab appt on 02/08/18 at 1245 prior to injection appt. Pt verbalized understanding.

## 2018-02-08 ENCOUNTER — Inpatient Hospital Stay: Payer: Medicare Other

## 2018-02-08 ENCOUNTER — Inpatient Hospital Stay: Payer: Medicare Other | Attending: Hematology

## 2018-02-08 VITALS — BP 145/75 | HR 68 | Temp 97.8°F | Resp 18

## 2018-02-08 DIAGNOSIS — C7951 Secondary malignant neoplasm of bone: Secondary | ICD-10-CM | POA: Diagnosis not present

## 2018-02-08 DIAGNOSIS — C3491 Malignant neoplasm of unspecified part of right bronchus or lung: Secondary | ICD-10-CM | POA: Diagnosis not present

## 2018-02-08 DIAGNOSIS — C3490 Malignant neoplasm of unspecified part of unspecified bronchus or lung: Secondary | ICD-10-CM

## 2018-02-08 DIAGNOSIS — Z95828 Presence of other vascular implants and grafts: Secondary | ICD-10-CM

## 2018-02-08 LAB — CBC WITH DIFFERENTIAL (CANCER CENTER ONLY)
BASOS PCT: 1 %
Basophils Absolute: 0.1 10*3/uL (ref 0.0–0.1)
EOS ABS: 0.2 10*3/uL (ref 0.0–0.5)
Eosinophils Relative: 4 %
HEMATOCRIT: 36.5 % (ref 34.8–46.6)
Hemoglobin: 11.6 g/dL (ref 11.6–15.9)
Lymphocytes Relative: 30 %
Lymphs Abs: 1.9 10*3/uL (ref 0.9–3.3)
MCH: 28.4 pg (ref 25.1–34.0)
MCHC: 31.8 g/dL (ref 31.5–36.0)
MCV: 89.5 fL (ref 79.5–101.0)
MONO ABS: 0.4 10*3/uL (ref 0.1–0.9)
MONOS PCT: 6 %
Neutro Abs: 3.7 10*3/uL (ref 1.5–6.5)
Neutrophils Relative %: 59 %
Platelet Count: 210 10*3/uL (ref 145–400)
RBC: 4.08 MIL/uL (ref 3.70–5.45)
RDW: 14.8 % — AB (ref 11.2–14.5)
WBC Count: 6.3 10*3/uL (ref 3.9–10.3)

## 2018-02-08 LAB — CMP (CANCER CENTER ONLY)
ALBUMIN: 3.6 g/dL (ref 3.5–5.0)
ALT: 29 U/L (ref 0–55)
ANION GAP: 8 (ref 3–11)
AST: 35 U/L — ABNORMAL HIGH (ref 5–34)
Alkaline Phosphatase: 78 U/L (ref 40–150)
BILIRUBIN TOTAL: 0.3 mg/dL (ref 0.2–1.2)
BUN: 17 mg/dL (ref 7–26)
CO2: 25 mmol/L (ref 22–29)
Calcium: 10 mg/dL (ref 8.4–10.4)
Chloride: 108 mmol/L (ref 98–109)
Creatinine: 1.15 mg/dL — ABNORMAL HIGH (ref 0.60–1.10)
GFR, Est AFR Am: 51 mL/min — ABNORMAL LOW (ref >60)
GFR, Estimated: 44 mL/min — ABNORMAL LOW (ref >60)
GLUCOSE: 137 mg/dL (ref 70–140)
POTASSIUM: 4.8 mmol/L (ref 3.5–5.1)
SODIUM: 141 mmol/L (ref 136–145)
TOTAL PROTEIN: 8.1 g/dL (ref 6.4–8.3)

## 2018-02-08 MED ORDER — DENOSUMAB 120 MG/1.7ML ~~LOC~~ SOLN
SUBCUTANEOUS | Status: AC
  Filled 2018-02-08: qty 1.7

## 2018-02-08 MED ORDER — OCTREOTIDE ACETATE 30 MG IM KIT
30.0000 mg | PACK | Freq: Once | INTRAMUSCULAR | Status: AC
  Administered 2018-02-08: 30 mg via INTRAMUSCULAR

## 2018-02-08 MED ORDER — DENOSUMAB 120 MG/1.7ML ~~LOC~~ SOLN
120.0000 mg | Freq: Once | SUBCUTANEOUS | Status: AC
  Administered 2018-02-08: 120 mg via SUBCUTANEOUS

## 2018-02-08 MED ORDER — OCTREOTIDE ACETATE 30 MG IM KIT
PACK | INTRAMUSCULAR | Status: AC
  Filled 2018-02-08: qty 1

## 2018-02-08 NOTE — Patient Instructions (Signed)
Octreotide injection solution What is this medicine? OCTREOTIDE (ok TREE oh tide) is used to reduce blood levels of growth hormone in patients with a condition called acromegaly. This medicine also reduces flushing and watery diarrhea caused by certain types of cancer. This medicine may be used for other purposes; ask your health care provider or pharmacist if you have questions. COMMON BRAND NAME(S): Sandostatin, Sandostatin LAR What should I tell my health care provider before I take this medicine? They need to know if you have any of these conditions: -gallbladder disease -kidney disease -liver disease -an unusual or allergic reaction to octreotide, other medicines, foods, dyes, or preservatives -pregnant or trying to get pregnant -breast-feeding How should I use this medicine? This medicine is for injection under the skin or into a vein (only in emergency situations). It is usually given by a health care professional in a hospital or clinic setting. If you get this medicine at home, you will be taught how to prepare and give this medicine. Allow the injection solution to come to room temperature before use. Do not warm it artificially. Use exactly as directed. Take your medicine at regular intervals. Do not take your medicine more often than directed. It is important that you put your used needles and syringes in a special sharps container. Do not put them in a trash can. If you do not have a sharps container, call your pharmacist or healthcare provider to get one. Talk to your pediatrician regarding the use of this medicine in children. Special care may be needed. Overdosage: If you think you have taken too much of this medicine contact a poison control center or emergency room at once. NOTE: This medicine is only for you. Do not share this medicine with others. What if I miss a dose? If you miss a dose, take it as soon as you can. If it is almost time for your next dose, take only that  dose. Do not take double or extra doses. What may interact with this medicine? Do not take this medicine with any of the following medications: -cisapride -droperidol -general anesthetics -grepafloxacin -perphenazine -thioridazine This medicine may also interact with the following medications: -bromocriptine -cyclosporine -diuretics -medicines for blood pressure, heart disease, irregular heart beat -medicines for diabetes, including insulin -quinidine This list may not describe all possible interactions. Give your health care provider a list of all the medicines, herbs, non-prescription drugs, or dietary supplements you use. Also tell them if you smoke, drink alcohol, or use illegal drugs. Some items may interact with your medicine. What should I watch for while using this medicine? Visit your doctor or health care professional for regular checks on your progress. To help reduce irritation at the injection site, use a different site for each injection and make sure the solution is at room temperature before use. This medicine may cause increases or decreases in blood sugar. Signs of high blood sugar include frequent urination, unusual thirst, flushed or dry skin, difficulty breathing, drowsiness, stomach ache, nausea, vomiting or dry mouth. Signs of low blood sugar include chills, cool, pale skin or cold sweats, drowsiness, extreme hunger, fast heartbeat, headache, nausea, nervousness or anxiety, shakiness, trembling, unsteadiness, tiredness, or weakness. Contact your doctor or health care professional right away if you experience any of these symptoms. What side effects may I notice from receiving this medicine? Side effects that you should report to your doctor or health care professional as soon as possible: -allergic reactions like skin rash, itching or hives, swelling   of the face, lips, or tongue -changes in blood sugar -changes in heart rate -severe stomach pain Side effects that  usually do not require medical attention (report to your doctor or health care professional if they continue or are bothersome): -diarrhea or constipation -gas or stomach pain -nausea, vomiting -pain, redness, swelling and irritation at site where injected This list may not describe all possible side effects. Call your doctor for medical advice about side effects. You may report side effects to FDA at 1-800-FDA-1088. Where should I keep my medicine? Keep out of the reach of children. Store in a refrigerator between 2 and 8 degrees C (36 and 46 degrees F). Protect from light. Allow to come to room temperature naturally. Do not use artificial heat. If protected from light, the injection may be stored at room temperature between 20 and 30 degrees C (70 and 86 degrees F) for 14 days. After the initial use, throw away any unused portion of a multiple dose vial after 14 days. Throw away unused portions of the ampules after use. NOTE: This sheet is a summary. It may not cover all possible information. If you have questions about this medicine, talk to your doctor, pharmacist, or health care provider.  2018 Elsevier/Gold Standard (2008-06-17 16:56:04)  Denosumab injection What is this medicine? DENOSUMAB (den oh sue mab) slows bone breakdown. Prolia is used to treat osteoporosis in women after menopause and in men. Delton See is used to treat a high calcium level due to cancer and to prevent bone fractures and other bone problems caused by multiple myeloma or cancer bone metastases. Delton See is also used to treat giant cell tumor of the bone. This medicine may be used for other purposes; ask your health care provider or pharmacist if you have questions. COMMON BRAND NAME(S): Prolia, XGEVA What should I tell my health care provider before I take this medicine? They need to know if you have any of these conditions: -dental disease -having surgery or tooth extraction -infection -kidney disease -low levels of  calcium or Vitamin D in the blood -malnutrition -on hemodialysis -skin conditions or sensitivity -thyroid or parathyroid disease -an unusual reaction to denosumab, other medicines, foods, dyes, or preservatives -pregnant or trying to get pregnant -breast-feeding How should I use this medicine? This medicine is for injection under the skin. It is given by a health care professional in a hospital or clinic setting. If you are getting Prolia, a special MedGuide will be given to you by the pharmacist with each prescription and refill. Be sure to read this information carefully each time. For Prolia, talk to your pediatrician regarding the use of this medicine in children. Special care may be needed. For Delton See, talk to your pediatrician regarding the use of this medicine in children. While this drug may be prescribed for children as young as 13 years for selected conditions, precautions do apply. Overdosage: If you think you have taken too much of this medicine contact a poison control center or emergency room at once. NOTE: This medicine is only for you. Do not share this medicine with others. What if I miss a dose? It is important not to miss your dose. Call your doctor or health care professional if you are unable to keep an appointment. What may interact with this medicine? Do not take this medicine with any of the following medications: -other medicines containing denosumab This medicine may also interact with the following medications: -medicines that lower your chance of fighting infection -steroid medicines like prednisone  or cortisone This list may not describe all possible interactions. Give your health care provider a list of all the medicines, herbs, non-prescription drugs, or dietary supplements you use. Also tell them if you smoke, drink alcohol, or use illegal drugs. Some items may interact with your medicine. What should I watch for while using this medicine? Visit your doctor or  health care professional for regular checks on your progress. Your doctor or health care professional may order blood tests and other tests to see how you are doing. Call your doctor or health care professional for advice if you get a fever, chills or sore throat, or other symptoms of a cold or flu. Do not treat yourself. This drug may decrease your body's ability to fight infection. Try to avoid being around people who are sick. You should make sure you get enough calcium and vitamin D while you are taking this medicine, unless your doctor tells you not to. Discuss the foods you eat and the vitamins you take with your health care professional. See your dentist regularly. Brush and floss your teeth as directed. Before you have any dental work done, tell your dentist you are receiving this medicine. Do not become pregnant while taking this medicine or for 5 months after stopping it. Talk with your doctor or health care professional about your birth control options while taking this medicine. Women should inform their doctor if they wish to become pregnant or think they might be pregnant. There is a potential for serious side effects to an unborn child. Talk to your health care professional or pharmacist for more information. What side effects may I notice from receiving this medicine? Side effects that you should report to your doctor or health care professional as soon as possible: -allergic reactions like skin rash, itching or hives, swelling of the face, lips, or tongue -bone pain -breathing problems -dizziness -jaw pain, especially after dental work -redness, blistering, peeling of the skin -signs and symptoms of infection like fever or chills; cough; sore throat; pain or trouble passing urine -signs of low calcium like fast heartbeat, muscle cramps or muscle pain; pain, tingling, numbness in the hands or feet; seizures -unusual bleeding or bruising -unusually weak or tired Side effects that  usually do not require medical attention (report to your doctor or health care professional if they continue or are bothersome): -constipation -diarrhea -headache -joint pain -loss of appetite -muscle pain -runny nose -tiredness -upset stomach This list may not describe all possible side effects. Call your doctor for medical advice about side effects. You may report side effects to FDA at 1-800-FDA-1088. Where should I keep my medicine? This medicine is only given in a clinic, doctor's office, or other health care setting and will not be stored at home. NOTE: This sheet is a summary. It may not cover all possible information. If you have questions about this medicine, talk to your doctor, pharmacist, or health care provider.  2018 Elsevier/Gold Standard (2016-12-13 19:17:21)   

## 2018-02-21 ENCOUNTER — Ambulatory Visit (INDEPENDENT_AMBULATORY_CARE_PROVIDER_SITE_OTHER): Payer: Medicare Other | Admitting: Adult Health

## 2018-02-21 ENCOUNTER — Other Ambulatory Visit: Payer: Self-pay | Admitting: Adult Health

## 2018-02-21 ENCOUNTER — Telehealth: Payer: Self-pay | Admitting: Adult Health

## 2018-02-21 ENCOUNTER — Ambulatory Visit: Payer: Medicare Other

## 2018-02-21 ENCOUNTER — Encounter: Payer: Self-pay | Admitting: Adult Health

## 2018-02-21 VITALS — BP 112/72 | HR 87 | Temp 98.8°F | Ht 67.99 in | Wt 143.0 lb

## 2018-02-21 DIAGNOSIS — R059 Cough, unspecified: Secondary | ICD-10-CM | POA: Insufficient documentation

## 2018-02-21 DIAGNOSIS — R05 Cough: Secondary | ICD-10-CM

## 2018-02-21 DIAGNOSIS — R7989 Other specified abnormal findings of blood chemistry: Secondary | ICD-10-CM | POA: Diagnosis not present

## 2018-02-21 DIAGNOSIS — Z0000 Encounter for general adult medical examination without abnormal findings: Secondary | ICD-10-CM

## 2018-02-21 LAB — POCT INFLUENZA A/B
INFLUENZA A, POC: NEGATIVE
Influenza B, POC: NEGATIVE

## 2018-02-21 MED ORDER — AZITHROMYCIN 250 MG PO TABS: ORAL_TABLET | ORAL | 0 refills | Status: DC

## 2018-02-21 NOTE — Assessment & Plan Note (Signed)
CXR Flu Test- Neg Please take Azithromycin as directed. Recommend OTC Robitussin for cough. Increase fluids and BRAT diet. If symptoms persist after Azithromycin completed, then call clinic. Please call clinic with any questions/concerns.

## 2018-02-21 NOTE — Telephone Encounter (Signed)
Patient called states she thought provider was including Rx cough meds with order at [facility]-- do not see any documentation regarding cough med except on AVS says pt to take OTC cough med-- Please call pt with clarification of cough meds.  --glh

## 2018-02-21 NOTE — Patient Instructions (Addendum)
Cough, Adult Coughing is a reflex that clears your throat and your airways. Coughing helps to heal and protect your lungs. It is normal to cough occasionally, but a cough that happens with other symptoms or lasts a long time may be a sign of a condition that needs treatment. A cough may last only 2-3 weeks (acute), or it may last longer than 8 weeks (chronic). What are the causes? Coughing is commonly caused by:  Breathing in substances that irritate your lungs.  A viral or bacterial respiratory infection.  Allergies.  Asthma.  Postnasal drip.  Smoking.  Acid backing up from the stomach into the esophagus (gastroesophageal reflux).  Certain medicines.  Chronic lung problems, including COPD (or rarely, lung cancer).  Other medical conditions such as heart failure.  Follow these instructions at home: Pay attention to any changes in your symptoms. Take these actions to help with your discomfort:  Take medicines only as told by your health care provider. ? If you were prescribed an antibiotic medicine, take it as told by your health care provider. Do not stop taking the antibiotic even if you start to feel better. ? Talk with your health care provider before you take a cough suppressant medicine.  Drink enough fluid to keep your urine clear or pale yellow.  If the air is dry, use a cold steam vaporizer or humidifier in your bedroom or your home to help loosen secretions.  Avoid anything that causes you to cough at work or at home.  If your cough is worse at night, try sleeping in a semi-upright position.  Avoid cigarette smoke. If you smoke, quit smoking. If you need help quitting, ask your health care provider.  Avoid caffeine.  Avoid alcohol.  Rest as needed.  Contact a health care provider if:  You have new symptoms.  You cough up pus.  Your cough does not get better after 2-3 weeks, or your cough gets worse.  You cannot control your cough with suppressant  medicines and you are losing sleep.  You develop pain that is getting worse or pain that is not controlled with pain medicines.  You have a fever.  You have unexplained weight loss.  You have night sweats. Get help right away if:  You cough up blood.  You have difficulty breathing.  Your heartbeat is very fast. This information is not intended to replace advice given to you by your health care provider. Make sure you discuss any questions you have with your health care provider. Document Released: 05/20/2011 Document Revised: 04/28/2016 Document Reviewed: 01/28/2015 Elsevier Interactive Patient Education  2018 [place] Diet A bland diet consists of foods that do not have a lot of fat or fiber. Foods without fat or fiber are easier for the body to digest. They are also less likely to irritate your mouth, throat, stomach, and other parts of your gastrointestinal tract. A bland diet is sometimes called a BRAT diet. What is my plan? Your health care provider or dietitian may recommend specific changes to your diet to prevent and treat your symptoms, such as:  Eating small meals often.  Cooking food until it is soft enough to chew easily.  Chewing your food well.  Drinking fluids slowly.  Not eating foods that are very spicy, sour, or fatty.  Not eating citrus fruits, such as oranges and grapefruit.  What do I need to know about this diet?  Eat a variety of foods from the bland diet food list.  Do not follow a bland diet longer than you have to.  Ask your health care provider whether you should take vitamins. What foods can I eat? Grains  Hot cereals, such as cream of wheat. Bread, crackers, or tortillas made from refined white flour. Rice. Vegetables Canned or cooked vegetables. Mashed or boiled potatoes. Fruits Bananas. Applesauce. Other types of cooked or canned fruit with the skin and seeds removed, such as canned peaches or pears. Meats and Other  Protein Sources Scrambled eggs. Creamy peanut butter or other nut butters. Lean, well-cooked meats, such as chicken or fish. Tofu. Soups or broths. Dairy Low-fat dairy products, such as milk, cottage cheese, or yogurt. Beverages Water. Herbal tea. Apple juice. Sweets and Desserts Pudding. Custard. Fruit gelatin. Ice cream. Fats and Oils Mild salad dressings. Canola or olive oil. The items listed above may not be a complete list of allowed foods or beverages. Contact your dietitian for more options. What foods are not recommended? Foods and ingredients that are often not recommended include:  Spicy foods, such as hot sauce or salsa.  Fried foods.  Sour foods, such as pickled or fermented foods.  Raw vegetables or fruits, especially citrus or berries.  Caffeinated drinks.  Alcohol.  Strongly flavored seasonings or condiments.  The items listed above may not be a complete list of foods and beverages that are not allowed. Contact your dietitian for more information. This information is not intended to replace advice given to you by your health care provider. Make sure you discuss any questions you have with your health care provider. Document Released: 03/14/2016 Document Revised: 04/28/2016 Document Reviewed: 12/03/2014 Elsevier Interactive Patient Education  2018 [place].  Flu Test- Neg Please take Azithromycin as directed. Recommend OTC Robitussin for cough. Increase fluids and BRAT diet. If symptoms persist after Azithromycin completed, then call clinic. Please call clinic with any questions/concerns. FEEL BETTER!

## 2018-02-21 NOTE — Progress Notes (Signed)
Subjective:    Patient ID: Cindy Byrd, female    DOB: [birth year], 77 y.o.   MRN: [id number]  HPI:  [name] presents with productive cough (green mucus), clear nasal drainage, dizziness with position changes, poor appetite, night sweats, mild nausea, and  post nasal gtt that all suddenly started 2-3 days ago.  She typically stays at home but ventured out grocery store last week and states "I must have picked up some bug". She denies CP/palptations/vomiting/diarrhea.   Patient Care Team    Relationship Specialty Notifications Start End  Mina Marble D, NP PCP - General Family Medicine  01/15/18   [name], MD Consulting Physician Hematology and Oncology Admissions 06/24/15     Patient Active Problem List   Diagnosis Date Noted  . Health care maintenance 01/15/2018  . Malignant neoplasm of lung ([place])   . Colitis 06/23/2017  . Left lower quadrant pain 06/23/2017  . Fracture of left iliac crest ([place]) 06/23/2017  . Metastatic lung cancer (metastasis from lung to other site), unspecified laterality ([place])   . Hypocalcemia 09/08/2016  . Vitamin D deficiency 09/08/2016  . New onset a-fib ([place]) 09/08/2016  . Chest pain   . Thrush of mouth and esophagus ([place])   . Protein-calorie malnutrition, severe ([place])   . Colitis determined by colorectal biopsy   . Bilateral pulmonary embolism ([place]) 09/07/2016  . DVT of lower extremity, bilateral ([place]) 09/07/2016  . Palliative care by specialist   . Goals of care, counseling/discussion   . Advance care planning   . Diarrhea   . Ulcerative pancolitis without complication ([place])   . Cancer ([place])   . Pressure injury of skin 09/02/2016  . HCAP (healthcare-associated pneumonia) 09/01/2016  . ARF (acute renal failure) ([place]) 09/01/2016  . Nausea with vomiting 07/18/2016  . Dehydration 07/18/2016  . Hypokalemia 07/18/2016  . Hypoalbuminemia due to protein-calorie malnutrition ([place]) 07/18/2016  . Peripheral edema 07/18/2016  .  Diarrhea due to drug 05/19/2016  . Port catheter in place 04/07/2016  . Nicotine addiction 01/19/2016  . Barrett's esophagus determined by biopsy 09/04/2015  . Gastritis   . Primary malignant neoplasm of lung metastatic to other site ([place]) 08/06/2015  . Esophageal reflux 08/04/2015  . Bone metastasis ([place]) 06/24/2015  . Neoplasm related pain 06/24/2015  . Protein calorie malnutrition ([place]) 06/24/2015  . Anorexia 06/24/2015  . Heavy smoker (more than 20 cigarettes per day) 06/24/2015  . Depression 06/24/2015  . HTN (hypertension) 06/24/2015     Past Medical History:  Diagnosis Date  . Barrett's esophagus   . Bone neoplasm 06/24/2015  . Cancer [facility])    metastatic poorly differentiated carcinoma. tumor left groin surgical removal with radiation tx.  . Cataract    BILATERAL  . Cigarette smoker two packs a day or less    Currently still smoking 2 PPD - Not interested in quitting at this time.  . Colitis 2017  . Colon polyps    hyperplastic, tubular adenomas, tubulovillous adenoma  . Cough, persistent    hx. lung cancer ? primary-being evaluated, unsure of primary site.  . Depression 06/24/2015  . Diverticulosis   . Emphysema of lung ([place])   . Endometriosis    Hysterectomy with BSO at age 29 yrs  . Esophageal adenocarcinoma ([place]) 08/11/15   intramucosal  . Gastritis   . GERD (gastroesophageal reflux disease)   . H/O: pneumonia   . Hiatal hernia   . Hyperlipidemia   . Hypertension 06/24/2015   likely improved incidental  to 40 lbs weight loss from her neoplasm. No Longer taking med for this as of 08-06-15  . IBS (irritable bowel syndrome)   . Pain    left hip-persistent"tumor of bone"-radiation tx. 10.  . Vitamin D deficiency disease      Past Surgical History:  Procedure Laterality Date  . BARTHOLIN GLAND CYST EXCISION  77 yo ago   Does not want if it was an infected cyst or tumor. Was soon as delivery  . COLONOSCOPY W/ POLYPECTOMY     multiple times - last done 09/2014  per patient.  . ESOPHAGOGASTRODUODENOSCOPY (EGD) WITH PROPOFOL N/A 08/11/2015   Procedure: ESOPHAGOGASTRODUODENOSCOPY (EGD) WITH PROPOFOL;  Surgeon: [name], MD;  Location: [facility] ENDOSCOPY;  Service: Gastroenterology;  Laterality: N/A;  . FLEXIBLE SIGMOIDOSCOPY N/A 06/24/2017   Procedure: FLEXIBLE SIGMOIDOSCOPY;  Surgeon: Manus Gunning, MD;  Location: [facility] ENDOSCOPY;  Service: Gastroenterology;  Laterality: N/A;  . GANGLION CYST EXCISION    . KNEE ARTHROSCOPY  age about 84 yrs  . TONSILLECTOMY    . TOTAL ABDOMINAL HYSTERECTOMY W/ BILATERAL SALPINGOOPHORECTOMY  at age 62 yrs   For endometriosis     Family History  Problem Relation Age of Onset  . Colon cancer Brother   . Colon cancer Brother   . Stroke Mother   . Colon cancer Father   . Breast cancer Daughter 7       ER/PR+ stage II     Social History   Substance and Sexual Activity  Drug Use No     Social History   Substance and Sexual Activity  Alcohol Use No  . Alcohol/week: 0.0 oz     Social History   Tobacco Use  Smoking Status Former Smoker  . Packs/day: 1.00  . Years: 60.00  . Pack years: 60.00  . Types: Cigarettes  . Last attempt to quit: 12/06/2015  . Years since quitting: 2.2  Smokeless Tobacco Never Used     Outpatient Encounter Medications as of 02/21/2018  Medication Sig  . amLODipine (NORVASC) 10 MG tablet Take 1 tablet (10 mg total) by mouth daily.  . calcium-vitamin D (OSCAL WITH D) 500-200 MG-UNIT tablet Take 2 tablets by mouth 3 (three) times daily.  [name] lidocaine-prilocaine (EMLA) cream Apply small amount over port 1-2 hours prior to treatment, cover with plastic wrap (DO NOT RUB IN).  . LORazepam (ATIVAN) 1 MG tablet Take 1 tablet (1 mg total) by mouth every 8 (eight) hours as needed for anxiety (or nausea).  [name] omeprazole (PRILOSEC) 40 MG capsule TAKE 1 CAPSULE BY MOUTH EVERY DAY  . saccharomyces boulardii (FLORASTOR) 250 MG capsule Take 1 capsule (250 mg total) by mouth 2 (two) times  daily.  . Vitamin D, Ergocalciferol, (DRISDOL) 50000 units CAPS capsule Take 50,000 Units by mouth every 7 (seven) days.  [name] azithromycin (ZITHROMAX) 250 MG tablet 2 tabs day one.  1 tab days 2-5  . enoxaparin (LOVENOX) 80 MG/0.8ML injection Inject 0.8 mLs (80 mg total) into the skin daily. (Patient not taking: Reported on 02/21/2018)  . ondansetron (ZOFRAN) 8 MG tablet Take 1 tablet (8 mg total) by mouth every 8 (eight) hours as needed for nausea or vomiting. (Patient not taking: Reported on 02/21/2018)  . [DISCONTINUED] azithromycin (ZITHROMAX) 250 MG tablet 2 tabs day one.  1 tab days 2-5   No facility-administered encounter medications on file as of 02/21/2018.     Allergies: Penicillins; Remeron [mirtazapine]; and Latex  Body mass index is 21.75 kg/m.  Blood pressure  112/72, pulse 87, temperature 98.8 F (37.1 C), height 5' 7.99" (1.727 m), weight 143 lb (64.9 kg), SpO2 95 %.   Review of Systems  Constitutional: Positive for activity change, appetite change and chills. Negative for diaphoresis, fever and unexpected weight change.  HENT: Positive for congestion, postnasal drip, rhinorrhea and sinus pressure. Negative for sinus pain and sore throat.   Eyes: Negative for visual disturbance.  Respiratory: Positive for cough. Negative for chest tightness, shortness of breath, wheezing and stridor.   Cardiovascular: Negative for chest pain, palpitations and leg swelling.  Gastrointestinal: Positive for nausea. Negative for abdominal distention, abdominal pain, blood in stool, constipation, diarrhea and vomiting.  Genitourinary: Negative for difficulty urinating and flank pain.  Neurological: Positive for dizziness. Negative for headaches.  Psychiatric/Behavioral: Positive for sleep disturbance.       Objective:   Physical Exam  Constitutional: She is oriented to person, place, and time. She appears well-developed and well-nourished. No distress.  HENT:  Head: Normocephalic and  atraumatic.  Right Ear: Hearing, external ear and ear canal normal. Tympanic membrane is bulging. Tympanic membrane is not erythematous. No decreased hearing is noted.  Left Ear: Hearing, external ear and ear canal normal. Tympanic membrane is bulging. Tympanic membrane is not erythematous. No decreased hearing is noted.  Nose: Mucosal edema and rhinorrhea present. Right sinus exhibits no maxillary sinus tenderness and no frontal sinus tenderness. Left sinus exhibits no maxillary sinus tenderness and no frontal sinus tenderness.  Mouth/Throat: Uvula is midline and mucous membranes are normal. Posterior oropharyngeal edema and posterior oropharyngeal erythema present. No oropharyngeal exudate or tonsillar abscesses.  Eyes: Conjunctivae are normal. Pupils are equal, round, and reactive to light.  Neck: Normal range of motion. Neck supple.  Cardiovascular: Normal rate, regular rhythm, normal heart sounds and intact distal pulses.  No murmur heard. Pulmonary/Chest: Effort normal and breath sounds normal. No respiratory distress. She has no wheezes. She has no rales. She exhibits no tenderness.  Lymphadenopathy:    She has no cervical adenopathy.  Neurological: She is alert and oriented to person, place, and time.  Skin: Skin is warm and dry. No rash noted. No erythema. No pallor.  Psychiatric: She has a normal mood and affect. Her behavior is normal. Judgment and thought content normal.  Nursing note and vitals reviewed.         Assessment & Plan:   1. Cough   2. Elevated serum creatinine     Cough CXR Flu Test- Neg Please take Azithromycin as directed. Recommend OTC Robitussin for cough. Increase fluids and BRAT diet. If symptoms persist after Azithromycin completed, then call clinic. Please call clinic with any questions/concerns.    FOLLOW-UP:  Return if symptoms worsen or fail to improve.

## 2018-02-22 LAB — COMPREHENSIVE METABOLIC PANEL
A/G RATIO: 0.9 — AB (ref 1.2–2.2)
ALBUMIN: 3.6 g/dL (ref 3.5–4.8)
ALK PHOS: 77 IU/L (ref 39–117)
ALT: 15 IU/L (ref 0–32)
AST: 27 IU/L (ref 0–40)
BUN / CREAT RATIO: 13 (ref 12–28)
BUN: 14 mg/dL (ref 8–27)
Bilirubin Total: 0.3 mg/dL (ref 0.0–1.2)
CHLORIDE: 103 mmol/L (ref 96–106)
CO2: 23 mmol/L (ref 20–29)
Calcium: 9.3 mg/dL (ref 8.7–10.3)
Creatinine, Ser: 1.09 mg/dL — ABNORMAL HIGH (ref 0.57–1.00)
GFR calc non Af Amer: 49 mL/min/{1.73_m2} — ABNORMAL LOW (ref >59)
GFR, EST AFRICAN AMERICAN: 56 mL/min/{1.73_m2} — AB (ref >59)
GLUCOSE: 98 mg/dL (ref 65–99)
Globulin, Total: 3.8 g/dL (ref 1.5–4.5)
POTASSIUM: 4.2 mmol/L (ref 3.5–5.2)
Sodium: 140 mmol/L (ref 134–144)
TOTAL PROTEIN: 7.4 g/dL (ref 6.0–8.5)

## 2018-02-22 LAB — CBC WITH DIFFERENTIAL/PLATELET
BASOS ABS: 0 10*3/uL (ref 0.0–0.2)
Basos: 0 %
EOS (ABSOLUTE): 0.3 10*3/uL (ref 0.0–0.4)
EOS: 3 %
Hematocrit: 35.3 % (ref 34.0–46.6)
Hemoglobin: 11.6 g/dL (ref 11.1–15.9)
Immature Grans (Abs): 0.1 10*3/uL (ref 0.0–0.1)
Immature Granulocytes: 1 %
LYMPHS ABS: 1.6 10*3/uL (ref 0.7–3.1)
Lymphs: 14 %
MCH: 28.6 pg (ref 26.6–33.0)
MCHC: 32.9 g/dL (ref 31.5–35.7)
MCV: 87 fL (ref 79–97)
MONOCYTES: 9 %
MONOS ABS: 1 10*3/uL — AB (ref 0.1–0.9)
NEUTROS PCT: 73 %
Neutrophils Absolute: 8.1 10*3/uL — ABNORMAL HIGH (ref 1.4–7.0)
PLATELETS: 295 10*3/uL (ref 150–379)
RBC: 4.06 x10E6/uL (ref 3.77–5.28)
RDW: 15.1 % (ref 12.3–15.4)
WBC: 11.1 10*3/uL — AB (ref 3.4–10.8)

## 2018-02-22 NOTE — Telephone Encounter (Signed)
Her insurance would not cover Tessalon, so I recommended OTC Robitussin. That was mentioned in AVS Please ask how she is feeling and if she needs anything else. Thanks! Valetta Fuller

## 2018-02-22 NOTE — Telephone Encounter (Signed)
Please advise.  T. Rayyan Burley, CMA 

## 2018-02-22 NOTE — Telephone Encounter (Signed)
Discuss with pt.  Pt expressed understanding and is agreeable.  [name], CMA

## 2018-03-07 NOTE — Progress Notes (Signed)
[name]    HEMATOLOGY ONCOLOGY PROGRESS NOTE  Date of service: 03/08/18   Patient Care Team: [name], NP as PCP - General (Family Medicine) [name], MD as Consulting Physician (Hematology and Oncology)  CHIEF COMPLAINTS/PURPOSE OF CONSULTATION:  F/u for metastatic lung cancer  DIAGNOSIS:   #1 Metastatic non-small cell lung cancer with bilateral lung nodules and large metastatic lesion in the left Ilium. #2 Barrett's esophagus with some evidence of intramucosal adenocarcinoma of the esophagus. 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 Also had c diff colitis - treated   Current Treatment  1) Active surveillance 2) Xgeva 131m [place] q4weeks for bone metastases. 3) Sandostatin q4weeks for diarrhea - immune colitis  Previous Treatment  1 Palliative radiation therapy to the large left ilium metastases 2. IV Nivolumab x 20ycles (discontinued due to likely immune colitis) 3. Xgeva 1270mSC q4weeks for bone metastases.   HISTORY OF PRESENTING ILLNESS: (plz see my previous consultation for details of initial presentation)  INTERVAL HISTORY  Ms ToSouffrants here for her scheduled follow-up for metastatic lung cancer. The patient's last visit with [place] on 01/11/18. The pt reports that she is doing well overall.   The pt reports that she is getting better from a recent infection.   She also notes that she is ready to switch to Xarelto from her Lovenox injections, which is quite reasonable at this time.   The pt reports that her diarrhea has diet correlation and has been adjusting her diet in accordance.   She notes that she is not sleeping very well and finds herself lying in bed with her "wheels spinning".   Of note since the patient's last visit, pt has had a CXR completed on 02/22/18 with results revealing Dense nodular opacity projecting over the left lower hemithorax, favored to  represent healing rib fracture. Bibasilar atelectasis.  Lab results today (03/08/18) of CBC, CMP, and Reticulocytes is as follows: all values are WNL except for RDW at 14.9, Creatinine at 1.26, Albumin at 3.3.  On review of systems, pt reports well controlled diarrhea, and denies problems breathing, fevers, chills, night sweats, leg swelling, abdominal pains, and any other symptoms.    MEDICAL HISTORY:  Past Medical History:  Diagnosis Date  . Barrett's esophagus   . Bone neoplasm 06/24/2015  . [facility]   metastatic poorly differentiated carcinoma. tumor left groin surgical removal with radiation tx.  . Cataract    BILATERAL  . Cigarette smoker two packs a day or less    Currently still smoking 2 PPD - Not interested in quitting at this time.  . Colitis 2017  . Colon polyps    hyperplastic, tubular adenomas, tubulovillous adenoma  . Cough, persistent    hx. lung cancer ? primary-being evaluated, unsure of primary site.  . Depression 06/24/2015  . Diverticulosis   . [place]  . Endometriosis    Hysterectomy with BSO at age [age]rs  . [place]   intramucosal  . Gastritis   . GERD (gastroesophageal reflux disease)   . H/O: pneumonia   . Hiatal hernia   . Hyperlipidemia   . Hypertension 06/24/2015   likely improved incidental to 40 lbs weight loss from her neoplasm. No Longer taking med for this as of 08-06-15  . IBS (irritable bowel syndrome)   . Pain    left hip-persistent"tumor of bone"-radiation tx. 10.  .[name]  Vitamin D deficiency disease    SURGICAL HISTORY: Past Surgical History:  Procedure Laterality Date  . BARTHOLIN GLAND CYST EXCISION  77 yo ago   Does not want if it was an infected cyst or tumor. Was soon as delivery  . COLONOSCOPY W/ POLYPECTOMY     multiple times - last done 09/2014 per patient.  . ESOPHAGOGASTRODUODENOSCOPY (EGD) WITH PROPOFOL N/A 08/11/2015   Procedure: ESOPHAGOGASTRODUODENOSCOPY (EGD) WITH PROPOFOL;   Surgeon: [name], MD;  Location: [facility];  Service: Gastroenterology;  Laterality: N/A;  . FLEXIBLE SIGMOIDOSCOPY N/A 06/24/2017   Procedure: FLEXIBLE SIGMOIDOSCOPY;  Surgeon: Manus Gunning, MD;  Location: [facility];  Service: Gastroenterology;  Laterality: N/A;  . GANGLION CYST EXCISION    . KNEE ARTHROSCOPY  age about 23 yrs  . TONSILLECTOMY    . TOTAL ABDOMINAL HYSTERECTOMY W/ BILATERAL SALPINGOOPHORECTOMY  at age 80 yrs   For endometriosis    SOCIAL HISTORY: Social History   Socioeconomic History  . Marital status: Widowed    Spouse name: Not on file  . Number of children: 2  . Years of education: Not on file  . Highest education level: Not on file  Occupational History  . Not on file  Social Needs  . Financial resource strain: Not on file  . Food insecurity:    Worry: Not on file    Inability: Not on file  . Transportation needs:    Medical: Not on file    Non-medical: Not on file  Tobacco Use  . Smoking status: Former Smoker    Packs/day: 1.00    Years: 60.00    Pack years: 60.00    Types: Cigarettes    Last attempt to quit: 12/06/2015    Years since quitting: 2.2  . Smokeless tobacco: Never Used  Substance and Sexual Activity  . Alcohol use: No    Alcohol/week: 0.0 oz  . Drug use: No  . Sexual activity: Never  Lifestyle  . Physical activity:    Days per week: Not on file    Minutes per session: Not on file  . Stress: Not on file  Relationships  . Social connections:    Talks on phone: Not on file    Gets together: Not on file    Attends religious service: Not on file    Active member of club or organization: Not on file    Attends meetings of clubs or organizations: Not on file    Relationship status: Not on file  . Intimate partner violence:    Fear of current or ex partner: Not on file    Emotionally abused: Not on file    Physically abused: Not on file    Forced sexual activity: Not on file  Other Topics Concern  . Not on  file  Social History Narrative  . Not on file    FAMILY HISTORY: Family History  Problem Relation Age of Onset  . Colon cancer Brother   . Colon cancer Brother   . Stroke Mother   . Colon cancer Father   . Breast cancer Daughter 14       ER/PR+ stage II    ALLERGIES:  is allergic to penicillins; remeron [mirtazapine]; and latex. patient wonders if she has a penicillin allergy but notes that she is uncertain about this.  MEDICATIONS:  Current Outpatient Medications  Medication Sig Dispense Refill  . amLODipine (NORVASC) 10 MG tablet Take 1 tablet (10 mg total) by mouth daily. 90 tablet 2  .  azithromycin (ZITHROMAX) 250 MG tablet 2 tabs day one.  1 tab days 2-5 6 tablet 0  . calcium-vitamin D (OSCAL WITH D) 500-200 MG-UNIT tablet Take 2 tablets by mouth 3 (three) times daily.    [name] enoxaparin (LOVENOX) 80 MG/0.8ML injection Inject 0.8 mLs (80 mg total) into the skin daily. 30 Syringe 2  . lidocaine-prilocaine (EMLA) cream Apply small amount over port 1-2 hours prior to treatment, cover with plastic wrap (DO NOT RUB IN). 30 g prn  . LORazepam (ATIVAN) 1 MG tablet Take 1 tablet (1 mg total) by mouth every 8 (eight) hours as needed for anxiety (or nausea). 30 tablet 0  . omeprazole (PRILOSEC) 40 MG capsule TAKE 1 CAPSULE BY MOUTH EVERY DAY 90 capsule 2  . saccharomyces boulardii (FLORASTOR) 250 MG capsule Take 1 capsule (250 mg total) by mouth 2 (two) times daily. 60 capsule 0  . Vitamin D, Ergocalciferol, (DRISDOL) 50000 units CAPS capsule Take 50,000 Units by mouth every 7 (seven) days.    . ondansetron (ZOFRAN) 8 MG tablet Take 1 tablet (8 mg total) by mouth every 8 (eight) hours as needed for nausea or vomiting. (Patient not taking: Reported on 02/21/2018) 60 tablet 0   No current facility-administered medications for this visit.     REVIEW OF SYSTEMS:    10 Point review of Systems was done is negative except as noted above.   PHYSICAL EXAMINATION: ECOG PERFORMANCE STATUS: 2 -  Symptomatic, <50% confined to bed  Vitals:   03/08/18 1504  BP: (!) 126/59  Pulse: 66  Resp: 20  Temp: 97.9 F (36.6 C)  SpO2: 100%   Filed Weights   03/08/18 1504  Weight: 145 lb 6.4 oz (66 kg)  . [name] Wt Readings from Last 3 Encounters:  03/08/18 145 lb 6.4 oz (66 kg)  02/21/18 143 lb (64.9 kg)  01/15/18 148 lb (67.1 kg)   GENERAL:alert, in no acute distress and comfortable SKIN: no acute rashes, no significant lesions EYES: conjunctiva are pink and non-injected, sclera anicteric OROPHARYNX: MMM, no exudates, no oropharyngeal erythema or ulceration NECK: supple, no JVD LYMPH:  no palpable lymphadenopathy in the cervical, axillary or inguinal regions LUNGS: clear to auscultation b/l with normal respiratory effort HEART: regular rate & rhythm ABDOMEN:  normoactive bowel sounds , non tender, not distended. Extremity: no pedal edema PSYCH: alert & oriented x 3 with fluent speech NEURO: no focal motor/sensory deficits    LABORATORY DATA:  I have reviewed the data as listed  . CBC Latest Ref Rng & Units 03/08/2018 02/21/2018 02/08/2018  WBC 3.9 - 10.3 K/uL 8.8 11.1(H) 6.3  Hemoglobin 11.6 - 15.9 g/dL 11.6 11.6 -  Hematocrit 34.8 - 46.6 % 36.3 35.3 36.5  Platelets 145 - 400 K/uL 254 295 210    . CMP Latest Ref Rng & Units 03/08/2018 02/21/2018 02/08/2018  Glucose 70 - 140 mg/dL 100 98 137  BUN 7 - 26 mg/dL 17 14 17   Creatinine 0.60 - 1.10 mg/dL 1.26(H) 1.09(H) 1.15(H)  Sodium 136 - 145 mmol/L 138 140 141  Potassium 3.5 - 5.1 mmol/L 4.0 4.2 4.8  Chloride 98 - 109 mmol/L 105 103 108  CO2 22 - 29 mmol/L 23 23 25   Calcium 8.4 - 10.4 mg/dL 9.2 9.3 10.0  Total Protein 6.4 - 8.3 g/dL 7.8 7.4 8.1  Total Bilirubin 0.2 - 1.2 mg/dL 0.3 0.3 0.3  Alkaline Phos 40 - 150 U/L 66 77 78  AST 5 - 34 U/L 27 27 35(H)  ALT 0 - 55 U/L 18 15 29       RADIOGRAPHIC STUDIES:  .Dg Chest 2 View  Result Date: 02/22/2018 CLINICAL DATA:  Patient with cough. EXAM: CHEST - 2 VIEW COMPARISON:  Chest  CT 01/04/2018. FINDINGS: Right anterior chest wall Port-A-Cath is present. Stable cardiac and mediastinal contours. Aortic atherosclerosis. Bibasilar heterogeneous opacities, favored represent atelectasis. No pleural effusion or pneumothorax. Thoracic spine degenerative changes. Dense nodular opacity projects over the left lower hemithorax, favored represent healing rib fracture. IMPRESSION: Dense nodular opacity projecting over the left lower hemithorax, favored to represent healing rib fracture. Bibasilar atelectasis. Electronically Signed   By: Lovey Newcomer M.D.   On: 02/22/2018 08:13      ASSESSMENT & PLAN:   #1 Metastatic poorly differentiated carcinoma with likely lung primary [non-small cell lung cancer].  CT of the head with and without contrast showed no evidence of metastatic disease. Patient notes much improved pain control. EGFR blood test mutation analysis negative. Patient's pain is much better controlled after radiation for the painful ilium met. CT chest abdomen pelvis 04/19/2016 shows no evidence of disease progression. Patient tolerated Nivolumab very well but was discontinued when she developed grade 2 Immune colitis. Has been off Nivolumab for >6 months  CT chest abdomen pelvis on 06/24/2016 shows no evidence of new disease or progression of metastatic disease. CT chest abdomen pelvis 09/06/2016 shows 1. Mixed interval response to therapy. 2. There is a new left ventral chest wall lesion deep to the pectoralis musculature worrisome for metastatic disease. 3. Posterior lower lobe nodular densities are identified which may reflect areas of pulmonary metastasis. 4. Interval decrease in size of destructive lesion involving the left iliac bone.  CT chest abd pelvis 12/08/2016: Cystic mass involving the left ventral chest wall has resolved in the interval. Likely was a hematoma due to trauma. Interval increase in size of pleural base mass overlying the posterior and inferior left lower  lobe. There is also a new left pleural effusion identified.  CT chest 02/01/2017: Residual irregular soft tissue thickening/volume loss and trace left pleural fluid at the base of the left hemithorax, overall improved in appearance from 12/08/2016. No measurable lesion.  CT chest 05/29/2017 shows no residual pleural based mass or significant pleural effusion in the left hemithorax. No evidence of thoracic metastatic disease. No evidence of progressive metastatic disease within the abdomen or pelvis. Mixed lytic and blastic lesion involving the left iliac bone and associated pathologic fracture are unchanged.   CT CAP 09/14/17 shows no new changes. She does have slight displacement of her fractured left iliac bone. Evidence of stable disease.   CT CAP 01/04/2018- No new or progressive metastatic disease. Stable large left iliac bone metastasis with associated chronic pathologic fracture.  PLAN --Discussed pt labwork today; blood counts and chemistries are stable.  --Pt has no overt evidence of disease progression at this time   -no indication for additional palliative systemic treatment for metastatic lung cancer at this time. -We will have her port flushed today  -Discussed what we might do if her cancer ever becomes problematic again, in response to her questions regarding this. -Encouraged her to continue making the most of her life.  -Continue once per month sandostatin which is successful controlling her diarrhea.  -Continue every other month Xgeva -Trazadone for patient's sleep difficulties -2000 units Vitamin D   #2 diarrhea-  now resolved was previously. S/p grade 2 likely related to immune colitis from her Nivolumab and also had c diff colitis (  s/p vancomycin) and possible underlying IBD She was previously on on Lialda, budesonide,probiotics and lomotil but not currently taking any of these. Plan -continue Sandostatin monthly  #3 h/o diverticulitis and c dff colitis - now  resolved Plan  -continue on sandostatin today and q4weeks.  -continue on Lialda, she has stopped. She will follow up with Dr. [name] to see if she should restart Lialda.   #4 DVT and PE  -Monitor for bleeding -Will switch pt from Lovenox to Xarelto   #5 Barrett's esophagus 4cms in the distal esophagus with low and high-grade dysplasia cannot rule out an early intra-mucosal esophageal adenocarcinoma. Plan -Patient being monitored by Dr. [name] from GI. -patient notes that she does not want to f/u at [facility] for further intervention as per GI plan.  Ct chest/Abd/pelvis in 7 weeks RTC with Dr Irene Limbo in 8 weeks with labs and CT Change Xgeva to q8weeks Continue sandostatin q4week Continue Port flush q8weeks   All questions were answered. The patient knows to call the clinic with any problems, questions or concerns.  . The total time spent in the appointment was 25 minutes and more than 50% was on counseling and direct patient cares.    Sullivan Lone MD MS Hematology/Oncology Physician [facility]  (Office): [phone] (Work cell): [phone] (Fax): [phone]  This document serves as a record of services personally performed by Sullivan Lone, MD. It was created on his behalf by Baldwin Jamaica, a trained medical scribe. The creation of this record is based on the scribe's personal observations and the provider's statements to them.   .I have reviewed the above documentation for accuracy and completeness, and I agree with the above. [name] MD MS

## 2018-03-08 ENCOUNTER — Inpatient Hospital Stay: Payer: Medicare Other

## 2018-03-08 ENCOUNTER — Ambulatory Visit: Payer: Medicare Other

## 2018-03-08 ENCOUNTER — Inpatient Hospital Stay: Payer: Medicare Other | Attending: Hematology | Admitting: Hematology

## 2018-03-08 ENCOUNTER — Encounter: Payer: Self-pay | Admitting: Hematology

## 2018-03-08 VITALS — BP 126/59 | HR 66 | Temp 97.9°F | Resp 20 | Ht 69.0 in | Wt 145.4 lb

## 2018-03-08 DIAGNOSIS — C3491 Malignant neoplasm of unspecified part of right bronchus or lung: Secondary | ICD-10-CM

## 2018-03-08 DIAGNOSIS — C7951 Secondary malignant neoplasm of bone: Secondary | ICD-10-CM | POA: Diagnosis not present

## 2018-03-08 DIAGNOSIS — Z95828 Presence of other vascular implants and grafts: Secondary | ICD-10-CM

## 2018-03-08 DIAGNOSIS — I82409 Acute embolism and thrombosis of unspecified deep veins of unspecified lower extremity: Secondary | ICD-10-CM | POA: Diagnosis not present

## 2018-03-08 DIAGNOSIS — C3490 Malignant neoplasm of unspecified part of unspecified bronchus or lung: Secondary | ICD-10-CM

## 2018-03-08 DIAGNOSIS — I2699 Other pulmonary embolism without acute cor pulmonale: Secondary | ICD-10-CM

## 2018-03-08 DIAGNOSIS — R197 Diarrhea, unspecified: Secondary | ICD-10-CM

## 2018-03-08 LAB — CMP (CANCER CENTER ONLY)
ALK PHOS: 66 U/L (ref 40–150)
ALT: 18 U/L (ref 0–55)
AST: 27 U/L (ref 5–34)
Albumin: 3.3 g/dL — ABNORMAL LOW (ref 3.5–5.0)
Anion gap: 10 (ref 3–11)
BUN: 17 mg/dL (ref 7–26)
CHLORIDE: 105 mmol/L (ref 98–109)
CO2: 23 mmol/L (ref 22–29)
CREATININE: 1.26 mg/dL — AB (ref 0.60–1.10)
Calcium: 9.2 mg/dL (ref 8.4–10.4)
GFR, EST NON AFRICAN AMERICAN: 40 mL/min — AB (ref >60)
GFR, Est AFR Am: 46 mL/min — ABNORMAL LOW (ref >60)
Glucose, Bld: 100 mg/dL (ref 70–140)
Potassium: 4 mmol/L (ref 3.5–5.1)
Sodium: 138 mmol/L (ref 136–145)
Total Bilirubin: 0.3 mg/dL (ref 0.2–1.2)
Total Protein: 7.8 g/dL (ref 6.4–8.3)

## 2018-03-08 LAB — CBC WITH DIFFERENTIAL/PLATELET
BASOS ABS: 0.1 10*3/uL (ref 0.0–0.1)
BASOS PCT: 1 %
EOS ABS: 0.5 10*3/uL (ref 0.0–0.5)
EOS PCT: 5 %
HCT: 36.3 % (ref 34.8–46.6)
Hemoglobin: 11.6 g/dL (ref 11.6–15.9)
Lymphocytes Relative: 22 %
Lymphs Abs: 1.9 10*3/uL (ref 0.9–3.3)
MCH: 28.7 pg (ref 25.1–34.0)
MCHC: 32 g/dL (ref 31.5–36.0)
MCV: 89.9 fL (ref 79.5–101.0)
MONO ABS: 0.6 10*3/uL (ref 0.1–0.9)
Monocytes Relative: 7 %
Neutro Abs: 5.7 10*3/uL (ref 1.5–6.5)
Neutrophils Relative %: 65 %
PLATELETS: 254 10*3/uL (ref 145–400)
RBC: 4.04 MIL/uL (ref 3.70–5.45)
RDW: 14.9 % — AB (ref 11.2–14.5)
WBC: 8.8 10*3/uL (ref 3.9–10.3)

## 2018-03-08 LAB — RETICULOCYTES
RBC.: 4.04 MIL/uL (ref 3.70–5.45)
RETIC CT PCT: 1.4 % (ref 0.7–2.1)
Retic Count, Absolute: 56.6 10*3/uL (ref 33.7–90.7)

## 2018-03-08 MED ORDER — HEPARIN SOD (PORK) LOCK FLUSH 100 UNIT/ML IV SOLN
500.0000 [IU] | Freq: Once | INTRAVENOUS | Status: AC
  Administered 2018-03-08: 500 [IU]
  Filled 2018-03-08: qty 5

## 2018-03-08 MED ORDER — DENOSUMAB 120 MG/1.7ML ~~LOC~~ SOLN
120.0000 mg | Freq: Once | SUBCUTANEOUS | Status: AC
  Administered 2018-03-08: 120 mg via SUBCUTANEOUS

## 2018-03-08 MED ORDER — OCTREOTIDE ACETATE 30 MG IM KIT
30.0000 mg | PACK | Freq: Once | INTRAMUSCULAR | Status: AC
  Administered 2018-03-08: 30 mg via INTRAMUSCULAR

## 2018-03-08 MED ORDER — DENOSUMAB 120 MG/1.7ML ~~LOC~~ SOLN
SUBCUTANEOUS | Status: AC
  Filled 2018-03-08: qty 1.7

## 2018-03-08 MED ORDER — TRAZODONE HCL 50 MG PO TABS: 50.0000 mg | ORAL_TABLET | Freq: Every day | ORAL | 3 refills | Status: DC

## 2018-03-08 MED ORDER — RIVAROXABAN 20 MG PO TABS: 20.0000 mg | ORAL_TABLET | Freq: Every day | ORAL | 3 refills | Status: DC

## 2018-03-08 MED ORDER — OCTREOTIDE ACETATE 30 MG IM KIT
PACK | INTRAMUSCULAR | Status: AC
  Filled 2018-03-08: qty 1

## 2018-03-08 MED ORDER — SODIUM CHLORIDE 0.9% FLUSH
10.0000 mL | Freq: Once | INTRAVENOUS | Status: AC
  Administered 2018-03-08: 10 mL
  Filled 2018-03-08: qty 10

## 2018-03-08 NOTE — Patient Instructions (Signed)
Octreotide injection solution What is this medicine? OCTREOTIDE (ok TREE oh tide) is used to reduce blood levels of growth hormone in patients with a condition called acromegaly. This medicine also reduces flushing and watery diarrhea caused by certain types of cancer. This medicine may be used for other purposes; ask your health care provider or pharmacist if you have questions. COMMON BRAND NAME(S): Sandostatin, Sandostatin LAR What should I tell my health care provider before I take this medicine? They need to know if you have any of these conditions: -gallbladder disease -kidney disease -liver disease -an unusual or allergic reaction to octreotide, other medicines, foods, dyes, or preservatives -pregnant or trying to get pregnant -breast-feeding How should I use this medicine? This medicine is for injection under the skin or into a vein (only in emergency situations). It is usually given by a health care professional in a hospital or clinic setting. If you get this medicine at home, you will be taught how to prepare and give this medicine. Allow the injection solution to come to room temperature before use. Do not warm it artificially. Use exactly as directed. Take your medicine at regular intervals. Do not take your medicine more often than directed. It is important that you put your used needles and syringes in a special sharps container. Do not put them in a trash can. If you do not have a sharps container, call your pharmacist or healthcare provider to get one. Talk to your pediatrician regarding the use of this medicine in children. Special care may be needed. Overdosage: If you think you have taken too much of this medicine contact a poison control center or emergency room at once. NOTE: This medicine is only for you. Do not share this medicine with others. What if I miss a dose? If you miss a dose, take it as soon as you can. If it is almost time for your next dose, take only that  dose. Do not take double or extra doses. What may interact with this medicine? Do not take this medicine with any of the following medications: -cisapride -droperidol -general anesthetics -grepafloxacin -perphenazine -thioridazine This medicine may also interact with the following medications: -bromocriptine -cyclosporine -diuretics -medicines for blood pressure, heart disease, irregular heart beat -medicines for diabetes, including insulin -quinidine This list may not describe all possible interactions. Give your health care provider a list of all the medicines, herbs, non-prescription drugs, or dietary supplements you use. Also tell them if you smoke, drink alcohol, or use illegal drugs. Some items may interact with your medicine. What should I watch for while using this medicine? Visit your doctor or health care professional for regular checks on your progress. To help reduce irritation at the injection site, use a different site for each injection and make sure the solution is at room temperature before use. This medicine may cause increases or decreases in blood sugar. Signs of high blood sugar include frequent urination, unusual thirst, flushed or dry skin, difficulty breathing, drowsiness, stomach ache, nausea, vomiting or dry mouth. Signs of low blood sugar include chills, cool, pale skin or cold sweats, drowsiness, extreme hunger, fast heartbeat, headache, nausea, nervousness or anxiety, shakiness, trembling, unsteadiness, tiredness, or weakness. Contact your doctor or health care professional right away if you experience any of these symptoms. What side effects may I notice from receiving this medicine? Side effects that you should report to your doctor or health care professional as soon as possible: -allergic reactions like skin rash, itching or hives, swelling   of the face, lips, or tongue -changes in blood sugar -changes in heart rate -severe stomach pain Side effects that  usually do not require medical attention (report to your doctor or health care professional if they continue or are bothersome): -diarrhea or constipation -gas or stomach pain -nausea, vomiting -pain, redness, swelling and irritation at site where injected This list may not describe all possible side effects. Call your doctor for medical advice about side effects. You may report side effects to FDA at 1-800-FDA-1088. Where should I keep my medicine? Keep out of the reach of children. Store in a refrigerator between 2 and 8 degrees C (36 and 46 degrees F). Protect from light. Allow to come to room temperature naturally. Do not use artificial heat. If protected from light, the injection may be stored at room temperature between 20 and 30 degrees C (70 and 86 degrees F) for 14 days. After the initial use, throw away any unused portion of a multiple dose vial after 14 days. Throw away unused portions of the ampules after use. NOTE: This sheet is a summary. It may not cover all possible information. If you have questions about this medicine, talk to your doctor, pharmacist, or health care provider.  2018 Elsevier/Gold Standard (2008-06-17 16:56:04)  Denosumab injection What is this medicine? DENOSUMAB (den oh sue mab) slows bone breakdown. Prolia is used to treat osteoporosis in women after menopause and in men. Delton See is used to treat a high calcium level due to cancer and to prevent bone fractures and other bone problems caused by multiple myeloma or cancer bone metastases. Delton See is also used to treat giant cell tumor of the bone. This medicine may be used for other purposes; ask your health care provider or pharmacist if you have questions. COMMON BRAND NAME(S): Prolia, XGEVA What should I tell my health care provider before I take this medicine? They need to know if you have any of these conditions: -dental disease -having surgery or tooth extraction -infection -kidney disease -low levels of  calcium or Vitamin D in the blood -malnutrition -on hemodialysis -skin conditions or sensitivity -thyroid or parathyroid disease -an unusual reaction to denosumab, other medicines, foods, dyes, or preservatives -pregnant or trying to get pregnant -breast-feeding How should I use this medicine? This medicine is for injection under the skin. It is given by a health care professional in a hospital or clinic setting. If you are getting Prolia, a special MedGuide will be given to you by the pharmacist with each prescription and refill. Be sure to read this information carefully each time. For Prolia, talk to your pediatrician regarding the use of this medicine in children. Special care may be needed. For Delton See, talk to your pediatrician regarding the use of this medicine in children. While this drug may be prescribed for children as young as 13 years for selected conditions, precautions do apply. Overdosage: If you think you have taken too much of this medicine contact a poison control center or emergency room at once. NOTE: This medicine is only for you. Do not share this medicine with others. What if I miss a dose? It is important not to miss your dose. Call your doctor or health care professional if you are unable to keep an appointment. What may interact with this medicine? Do not take this medicine with any of the following medications: -other medicines containing denosumab This medicine may also interact with the following medications: -medicines that lower your chance of fighting infection -steroid medicines like prednisone  or cortisone This list may not describe all possible interactions. Give your health care provider a list of all the medicines, herbs, non-prescription drugs, or dietary supplements you use. Also tell them if you smoke, drink alcohol, or use illegal drugs. Some items may interact with your medicine. What should I watch for while using this medicine? Visit your doctor or  health care professional for regular checks on your progress. Your doctor or health care professional may order blood tests and other tests to see how you are doing. Call your doctor or health care professional for advice if you get a fever, chills or sore throat, or other symptoms of a cold or flu. Do not treat yourself. This drug may decrease your body's ability to fight infection. Try to avoid being around people who are sick. You should make sure you get enough calcium and vitamin D while you are taking this medicine, unless your doctor tells you not to. Discuss the foods you eat and the vitamins you take with your health care professional. See your dentist regularly. Brush and floss your teeth as directed. Before you have any dental work done, tell your dentist you are receiving this medicine. Do not become pregnant while taking this medicine or for 5 months after stopping it. Talk with your doctor or health care professional about your birth control options while taking this medicine. Women should inform their doctor if they wish to become pregnant or think they might be pregnant. There is a potential for serious side effects to an unborn child. Talk to your health care professional or pharmacist for more information. What side effects may I notice from receiving this medicine? Side effects that you should report to your doctor or health care professional as soon as possible: -allergic reactions like skin rash, itching or hives, swelling of the face, lips, or tongue -bone pain -breathing problems -dizziness -jaw pain, especially after dental work -redness, blistering, peeling of the skin -signs and symptoms of infection like fever or chills; cough; sore throat; pain or trouble passing urine -signs of low calcium like fast heartbeat, muscle cramps or muscle pain; pain, tingling, numbness in the hands or feet; seizures -unusual bleeding or bruising -unusually weak or tired Side effects that  usually do not require medical attention (report to your doctor or health care professional if they continue or are bothersome): -constipation -diarrhea -headache -joint pain -loss of appetite -muscle pain -runny nose -tiredness -upset stomach This list may not describe all possible side effects. Call your doctor for medical advice about side effects. You may report side effects to FDA at 1-800-FDA-1088. Where should I keep my medicine? This medicine is only given in a clinic, doctor's office, or other health care setting and will not be stored at home. NOTE: This sheet is a summary. It may not cover all possible information. If you have questions about this medicine, talk to your doctor, pharmacist, or health care provider.  2018 Elsevier/Gold Standard (2016-12-13 19:17:21)   

## 2018-03-09 ENCOUNTER — Telehealth: Payer: Self-pay

## 2018-03-09 NOTE — Telephone Encounter (Signed)
Spoke with patient will come by and pick up contrast, and calender of new appointments. Per 4/4 los.

## 2018-03-21 ENCOUNTER — Other Ambulatory Visit: Payer: Self-pay | Admitting: Hematology

## 2018-04-05 ENCOUNTER — Other Ambulatory Visit: Payer: Self-pay | Admitting: *Deleted

## 2018-04-05 ENCOUNTER — Inpatient Hospital Stay: Payer: Medicare Other

## 2018-04-05 ENCOUNTER — Inpatient Hospital Stay: Payer: Medicare Other | Attending: Hematology

## 2018-04-05 ENCOUNTER — Other Ambulatory Visit: Payer: Self-pay

## 2018-04-05 DIAGNOSIS — C7951 Secondary malignant neoplasm of bone: Secondary | ICD-10-CM | POA: Diagnosis not present

## 2018-04-05 DIAGNOSIS — C3491 Malignant neoplasm of unspecified part of right bronchus or lung: Secondary | ICD-10-CM | POA: Insufficient documentation

## 2018-04-05 DIAGNOSIS — C3490 Malignant neoplasm of unspecified part of unspecified bronchus or lung: Secondary | ICD-10-CM

## 2018-04-05 DIAGNOSIS — Z95828 Presence of other vascular implants and grafts: Secondary | ICD-10-CM

## 2018-04-05 LAB — CBC WITH DIFFERENTIAL (CANCER CENTER ONLY)
Basophils Absolute: 0 10*3/uL (ref 0.0–0.1)
Basophils Relative: 1 %
EOS ABS: 0.3 10*3/uL (ref 0.0–0.5)
EOS PCT: 4 %
HCT: 36.5 % (ref 34.8–46.6)
HEMOGLOBIN: 11.7 g/dL (ref 11.6–15.9)
LYMPHS ABS: 1.6 10*3/uL (ref 0.9–3.3)
LYMPHS PCT: 22 %
MCH: 29 pg (ref 25.1–34.0)
MCHC: 32.1 g/dL (ref 31.5–36.0)
MCV: 90.3 fL (ref 79.5–101.0)
MONOS PCT: 8 %
Monocytes Absolute: 0.6 10*3/uL (ref 0.1–0.9)
Neutro Abs: 4.8 10*3/uL (ref 1.5–6.5)
Neutrophils Relative %: 65 %
Platelet Count: 202 10*3/uL (ref 145–400)
RBC: 4.04 MIL/uL (ref 3.70–5.45)
RDW: 14.2 % (ref 11.2–14.5)
WBC Count: 7.2 10*3/uL (ref 3.9–10.3)

## 2018-04-05 LAB — CMP (CANCER CENTER ONLY)
ALBUMIN: 3.6 g/dL (ref 3.5–5.0)
ALT: 12 U/L (ref 0–55)
AST: 21 U/L (ref 5–34)
Alkaline Phosphatase: 60 U/L (ref 40–150)
Anion gap: 7 (ref 3–11)
BUN: 18 mg/dL (ref 7–26)
CHLORIDE: 110 mmol/L — AB (ref 98–109)
CO2: 21 mmol/L — AB (ref 22–29)
Calcium: 8.9 mg/dL (ref 8.4–10.4)
Creatinine: 1.27 mg/dL — ABNORMAL HIGH (ref 0.60–1.10)
GFR, Est AFR Am: 46 mL/min — ABNORMAL LOW (ref >60)
GFR, Estimated: 39 mL/min — ABNORMAL LOW (ref >60)
Glucose, Bld: 108 mg/dL (ref 70–140)
POTASSIUM: 4.2 mmol/L (ref 3.5–5.1)
SODIUM: 138 mmol/L (ref 136–145)
Total Bilirubin: 0.3 mg/dL (ref 0.2–1.2)
Total Protein: 8.1 g/dL (ref 6.4–8.3)

## 2018-04-05 LAB — RETICULOCYTES
RBC.: 4.04 MIL/uL (ref 3.70–5.45)
RETIC CT PCT: 1.3 % (ref 0.7–2.1)
Retic Count, Absolute: 52.5 10*3/uL (ref 33.7–90.7)

## 2018-04-05 MED ORDER — OCTREOTIDE ACETATE 30 MG IM KIT
PACK | INTRAMUSCULAR | Status: AC
  Filled 2018-04-05: qty 1

## 2018-04-05 MED ORDER — OCTREOTIDE ACETATE 30 MG IM KIT
30.0000 mg | PACK | Freq: Once | INTRAMUSCULAR | Status: AC
  Administered 2018-04-05: 30 mg via INTRAMUSCULAR

## 2018-04-05 MED ORDER — DENOSUMAB 120 MG/1.7ML ~~LOC~~ SOLN
SUBCUTANEOUS | Status: AC
  Filled 2018-04-05: qty 1.7

## 2018-04-05 NOTE — Progress Notes (Signed)
Completed on 04/05/2018

## 2018-04-05 NOTE — Patient Instructions (Signed)
Octreotide injection solution What is this medicine? OCTREOTIDE (ok TREE oh tide) is used to reduce blood levels of growth hormone in patients with a condition called acromegaly. This medicine also reduces flushing and watery diarrhea caused by certain types of cancer. This medicine may be used for other purposes; ask your health care provider or pharmacist if you have questions. COMMON BRAND NAME(S): Sandostatin, Sandostatin LAR What should I tell my health care provider before I take this medicine? They need to know if you have any of these conditions: -gallbladder disease -kidney disease -liver disease -an unusual or allergic reaction to octreotide, other medicines, foods, dyes, or preservatives -pregnant or trying to get pregnant -breast-feeding How should I use this medicine? This medicine is for injection under the skin or into a vein (only in emergency situations). It is usually given by a health care professional in a hospital or clinic setting. If you get this medicine at home, you will be taught how to prepare and give this medicine. Allow the injection solution to come to room temperature before use. Do not warm it artificially. Use exactly as directed. Take your medicine at regular intervals. Do not take your medicine more often than directed. It is important that you put your used needles and syringes in a special sharps container. Do not put them in a trash can. If you do not have a sharps container, call your pharmacist or healthcare provider to get one. Talk to your pediatrician regarding the use of this medicine in children. Special care may be needed. Overdosage: If you think you have taken too much of this medicine contact a poison control center or emergency room at once. NOTE: This medicine is only for you. Do not share this medicine with others. What if I miss a dose? If you miss a dose, take it as soon as you can. If it is almost time for your next dose, take only that  dose. Do not take double or extra doses. What may interact with this medicine? Do not take this medicine with any of the following medications: -cisapride -droperidol -general anesthetics -grepafloxacin -perphenazine -thioridazine This medicine may also interact with the following medications: -bromocriptine -cyclosporine -diuretics -medicines for blood pressure, heart disease, irregular heart beat -medicines for diabetes, including insulin -quinidine This list may not describe all possible interactions. Give your health care provider a list of all the medicines, herbs, non-prescription drugs, or dietary supplements you use. Also tell them if you smoke, drink alcohol, or use illegal drugs. Some items may interact with your medicine. What should I watch for while using this medicine? Visit your doctor or health care professional for regular checks on your progress. To help reduce irritation at the injection site, use a different site for each injection and make sure the solution is at room temperature before use. This medicine may cause increases or decreases in blood sugar. Signs of high blood sugar include frequent urination, unusual thirst, flushed or dry skin, difficulty breathing, drowsiness, stomach ache, nausea, vomiting or dry mouth. Signs of low blood sugar include chills, cool, pale skin or cold sweats, drowsiness, extreme hunger, fast heartbeat, headache, nausea, nervousness or anxiety, shakiness, trembling, unsteadiness, tiredness, or weakness. Contact your doctor or health care professional right away if you experience any of these symptoms. What side effects may I notice from receiving this medicine? Side effects that you should report to your doctor or health care professional as soon as possible: -allergic reactions like skin rash, itching or hives, swelling   of the face, lips, or tongue -changes in blood sugar -changes in heart rate -severe stomach pain Side effects that  usually do not require medical attention (report to your doctor or health care professional if they continue or are bothersome): -diarrhea or constipation -gas or stomach pain -nausea, vomiting -pain, redness, swelling and irritation at site where injected This list may not describe all possible side effects. Call your doctor for medical advice about side effects. You may report side effects to FDA at 1-800-FDA-1088. Where should I keep my medicine? Keep out of the reach of children. Store in a refrigerator between 2 and 8 degrees C (36 and 46 degrees F). Protect from light. Allow to come to room temperature naturally. Do not use artificial heat. If protected from light, the injection may be stored at room temperature between 20 and 30 degrees C (70 and 86 degrees F) for 14 days. After the initial use, throw away any unused portion of a multiple dose vial after 14 days. Throw away unused portions of the ampules after use. NOTE: This sheet is a summary. It may not cover all possible information. If you have questions about this medicine, talk to your doctor, pharmacist, or health care provider.  2018 Elsevier/Gold Standard (2008-06-17 16:56:04)  Denosumab injection What is this medicine? DENOSUMAB (den oh sue mab) slows bone breakdown. Prolia is used to treat osteoporosis in women after menopause and in men. Delton See is used to treat a high calcium level due to cancer and to prevent bone fractures and other bone problems caused by multiple myeloma or cancer bone metastases. Delton See is also used to treat giant cell tumor of the bone. This medicine may be used for other purposes; ask your health care provider or pharmacist if you have questions. COMMON BRAND NAME(S): Prolia, XGEVA What should I tell my health care provider before I take this medicine? They need to know if you have any of these conditions: -dental disease -having surgery or tooth extraction -infection -kidney disease -low levels of  calcium or Vitamin D in the blood -malnutrition -on hemodialysis -skin conditions or sensitivity -thyroid or parathyroid disease -an unusual reaction to denosumab, other medicines, foods, dyes, or preservatives -pregnant or trying to get pregnant -breast-feeding How should I use this medicine? This medicine is for injection under the skin. It is given by a health care professional in a hospital or clinic setting. If you are getting Prolia, a special MedGuide will be given to you by the pharmacist with each prescription and refill. Be sure to read this information carefully each time. For Prolia, talk to your pediatrician regarding the use of this medicine in children. Special care may be needed. For Delton See, talk to your pediatrician regarding the use of this medicine in children. While this drug may be prescribed for children as young as 13 years for selected conditions, precautions do apply. Overdosage: If you think you have taken too much of this medicine contact a poison control center or emergency room at once. NOTE: This medicine is only for you. Do not share this medicine with others. What if I miss a dose? It is important not to miss your dose. Call your doctor or health care professional if you are unable to keep an appointment. What may interact with this medicine? Do not take this medicine with any of the following medications: -other medicines containing denosumab This medicine may also interact with the following medications: -medicines that lower your chance of fighting infection -steroid medicines like prednisone  or cortisone This list may not describe all possible interactions. Give your health care provider a list of all the medicines, herbs, non-prescription drugs, or dietary supplements you use. Also tell them if you smoke, drink alcohol, or use illegal drugs. Some items may interact with your medicine. What should I watch for while using this medicine? Visit your doctor or  health care professional for regular checks on your progress. Your doctor or health care professional may order blood tests and other tests to see how you are doing. Call your doctor or health care professional for advice if you get a fever, chills or sore throat, or other symptoms of a cold or flu. Do not treat yourself. This drug may decrease your body's ability to fight infection. Try to avoid being around people who are sick. You should make sure you get enough calcium and vitamin D while you are taking this medicine, unless your doctor tells you not to. Discuss the foods you eat and the vitamins you take with your health care professional. See your dentist regularly. Brush and floss your teeth as directed. Before you have any dental work done, tell your dentist you are receiving this medicine. Do not become pregnant while taking this medicine or for 5 months after stopping it. Talk with your doctor or health care professional about your birth control options while taking this medicine. Women should inform their doctor if they wish to become pregnant or think they might be pregnant. There is a potential for serious side effects to an unborn child. Talk to your health care professional or pharmacist for more information. What side effects may I notice from receiving this medicine? Side effects that you should report to your doctor or health care professional as soon as possible: -allergic reactions like skin rash, itching or hives, swelling of the face, lips, or tongue -bone pain -breathing problems -dizziness -jaw pain, especially after dental work -redness, blistering, peeling of the skin -signs and symptoms of infection like fever or chills; cough; sore throat; pain or trouble passing urine -signs of low calcium like fast heartbeat, muscle cramps or muscle pain; pain, tingling, numbness in the hands or feet; seizures -unusual bleeding or bruising -unusually weak or tired Side effects that  usually do not require medical attention (report to your doctor or health care professional if they continue or are bothersome): -constipation -diarrhea -headache -joint pain -loss of appetite -muscle pain -runny nose -tiredness -upset stomach This list may not describe all possible side effects. Call your doctor for medical advice about side effects. You may report side effects to FDA at 1-800-FDA-1088. Where should I keep my medicine? This medicine is only given in a clinic, doctor's office, or other health care setting and will not be stored at home. NOTE: This sheet is a summary. It may not cover all possible information. If you have questions about this medicine, talk to your doctor, pharmacist, or health care provider.  2018 Elsevier/Gold Standard (2016-12-13 19:17:21)   

## 2018-04-16 ENCOUNTER — Other Ambulatory Visit: Payer: Medicare Other

## 2018-04-16 DIAGNOSIS — I10 Essential (primary) hypertension: Secondary | ICD-10-CM

## 2018-04-16 DIAGNOSIS — R7989 Other specified abnormal findings of blood chemistry: Secondary | ICD-10-CM | POA: Diagnosis not present

## 2018-04-16 DIAGNOSIS — R739 Hyperglycemia, unspecified: Secondary | ICD-10-CM | POA: Diagnosis not present

## 2018-04-16 DIAGNOSIS — E876 Hypokalemia: Secondary | ICD-10-CM | POA: Diagnosis not present

## 2018-04-16 DIAGNOSIS — Z0000 Encounter for general adult medical examination without abnormal findings: Secondary | ICD-10-CM | POA: Diagnosis not present

## 2018-04-16 DIAGNOSIS — E559 Vitamin D deficiency, unspecified: Secondary | ICD-10-CM

## 2018-04-16 DIAGNOSIS — E039 Hypothyroidism, unspecified: Secondary | ICD-10-CM | POA: Diagnosis not present

## 2018-04-17 DIAGNOSIS — R7989 Other specified abnormal findings of blood chemistry: Secondary | ICD-10-CM | POA: Insufficient documentation

## 2018-04-17 LAB — CBC WITH DIFFERENTIAL/PLATELET
Basophils Absolute: 0.1 10*3/uL (ref 0.0–0.2)
Basos: 1 %
EOS (ABSOLUTE): 0.2 10*3/uL (ref 0.0–0.4)
Eos: 3 %
HEMATOCRIT: 37.3 % (ref 34.0–46.6)
HEMOGLOBIN: 11.6 g/dL (ref 11.1–15.9)
Immature Grans (Abs): 0 10*3/uL (ref 0.0–0.1)
Immature Granulocytes: 0 %
LYMPHS ABS: 1.8 10*3/uL (ref 0.7–3.1)
Lymphs: 27 %
MCH: 27.9 pg (ref 26.6–33.0)
MCHC: 31.1 g/dL — AB (ref 31.5–35.7)
MCV: 90 fL (ref 79–97)
MONOCYTES: 7 %
Monocytes Absolute: 0.5 10*3/uL (ref 0.1–0.9)
NEUTROS ABS: 4.1 10*3/uL (ref 1.4–7.0)
Neutrophils: 62 %
Platelets: 225 10*3/uL (ref 150–379)
RBC: 4.16 x10E6/uL (ref 3.77–5.28)
RDW: 14.4 % (ref 12.3–15.4)
WBC: 6.6 10*3/uL (ref 3.4–10.8)

## 2018-04-17 LAB — TSH: TSH: 5.6 u[IU]/mL — ABNORMAL HIGH (ref 0.450–4.500)

## 2018-04-17 LAB — COMPREHENSIVE METABOLIC PANEL
ALK PHOS: 65 IU/L (ref 39–117)
ALT: 14 IU/L (ref 0–32)
AST: 27 IU/L (ref 0–40)
Albumin/Globulin Ratio: 1.2 (ref 1.2–2.2)
Albumin: 3.9 g/dL (ref 3.5–4.8)
BUN/Creatinine Ratio: 16 (ref 12–28)
BUN: 16 mg/dL (ref 8–27)
Bilirubin Total: 0.2 mg/dL (ref 0.0–1.2)
CHLORIDE: 104 mmol/L (ref 96–106)
CO2: 24 mmol/L (ref 20–29)
Calcium: 8.9 mg/dL (ref 8.7–10.3)
Creatinine, Ser: 1 mg/dL (ref 0.57–1.00)
GFR calc Af Amer: 62 mL/min/{1.73_m2} (ref >59)
GFR calc non Af Amer: 54 mL/min/{1.73_m2} — ABNORMAL LOW (ref >59)
GLOBULIN, TOTAL: 3.2 g/dL (ref 1.5–4.5)
Glucose: 102 mg/dL — ABNORMAL HIGH (ref 65–99)
POTASSIUM: 4.3 mmol/L (ref 3.5–5.2)
Sodium: 140 mmol/L (ref 134–144)
Total Protein: 7.1 g/dL (ref 6.0–8.5)

## 2018-04-17 LAB — LIPID PANEL
CHOL/HDL RATIO: 4 ratio (ref 0.0–4.4)
Cholesterol, Total: 169 mg/dL (ref 100–199)
HDL: 42 mg/dL (ref >39)
LDL CALC: 107 mg/dL — AB (ref 0–99)
Triglycerides: 99 mg/dL (ref 0–149)
VLDL CHOLESTEROL CAL: 20 mg/dL (ref 5–40)

## 2018-04-17 LAB — HEMOGLOBIN A1C
ESTIMATED AVERAGE GLUCOSE: 123 mg/dL
HEMOGLOBIN A1C: 5.9 % — AB (ref 4.8–5.6)

## 2018-04-17 LAB — VITAMIN D 25 HYDROXY (VIT D DEFICIENCY, FRACTURES): VIT D 25 HYDROXY: 30.1 ng/mL (ref 30.0–100.0)

## 2018-04-18 ENCOUNTER — Telehealth: Payer: Self-pay | Admitting: Adult Health

## 2018-04-18 NOTE — Telephone Encounter (Signed)
Patient states nurse called her--  Forwarding message to medical assistant that patient returned her call.  --glh

## 2018-04-19 NOTE — Telephone Encounter (Signed)
Advised pt that I originally contacted her regarding lab results however, since she is coming 04/23/18 for CPE that Valetta Fuller will review results with her at that time.  [name], CMA

## 2018-04-23 ENCOUNTER — Encounter: Payer: Self-pay | Admitting: Adult Health

## 2018-04-23 ENCOUNTER — Ambulatory Visit (INDEPENDENT_AMBULATORY_CARE_PROVIDER_SITE_OTHER): Payer: Medicare Other | Admitting: Adult Health

## 2018-04-23 VITALS — BP 117/60 | HR 67 | Ht 69.0 in | Wt 150.2 lb

## 2018-04-23 DIAGNOSIS — Z0000 Encounter for general adult medical examination without abnormal findings: Secondary | ICD-10-CM | POA: Diagnosis not present

## 2018-04-23 DIAGNOSIS — E785 Hyperlipidemia, unspecified: Secondary | ICD-10-CM | POA: Diagnosis not present

## 2018-04-23 DIAGNOSIS — I10 Essential (primary) hypertension: Secondary | ICD-10-CM

## 2018-04-23 DIAGNOSIS — N189 Chronic kidney disease, unspecified: Secondary | ICD-10-CM

## 2018-04-23 DIAGNOSIS — N1832 Chronic kidney disease, stage 3b: Secondary | ICD-10-CM

## 2018-04-23 LAB — SPECIMEN STATUS REPORT

## 2018-04-23 LAB — T4, FREE: FREE T4: 0.99 ng/dL (ref 0.82–1.77)

## 2018-04-23 LAB — T3: T3 TOTAL: 118 ng/dL (ref 71–180)

## 2018-04-23 NOTE — Patient Instructions (Signed)
Preventive Care for Adults, Female  A healthy lifestyle and preventive care can promote health and wellness. Preventive health guidelines for women include the following key practices.   A routine yearly physical is a good way to check with your health care provider about your health and preventive screening. It is a chance to share any concerns and updates on your health and to receive a thorough exam.   Visit your dentist for a routine exam and preventive care every 6 months. Brush your teeth twice a day and floss once a day. Good oral hygiene prevents tooth decay and gum disease.   The frequency of eye exams is based on your age, health, family medical history, use of contact lenses, and other factors. Follow your health care provider's recommendations for frequency of eye exams.   Eat a healthy diet. Foods like vegetables, fruits, whole grains, low-fat dairy products, and lean protein foods contain the nutrients you need without too many calories. Decrease your intake of foods high in solid fats, added sugars, and salt. Eat the right amount of calories for you.Get information about a proper diet from your health care provider, if necessary.   Regular physical exercise is one of the most important things you can do for your health. Most adults should get at least 150 minutes of moderate-intensity exercise (any activity that increases your heart rate and causes you to sweat) each week. In addition, most adults need muscle-strengthening exercises on 2 or more days a week.   Maintain a healthy weight. The body mass index (BMI) is a screening tool to identify possible weight problems. It provides an estimate of body fat based on height and weight. Your health care provider can find your BMI, and can help you achieve or maintain a healthy weight.For adults 20 years and older:   - A BMI below 18.5 is considered underweight.   - A BMI of 18.5 to 24.9 is normal.   - A BMI of 25 to 29.9 is  considered overweight.   - A BMI of 30 and above is considered obese.   Maintain normal blood lipids and cholesterol levels by exercising and minimizing your intake of trans and saturated fats.  Eat a balanced diet with plenty of fruit and vegetables. Blood tests for lipids and cholesterol should begin at age 54 and be repeated every 5 years minimum.  If your lipid or cholesterol levels are high, you are over 40, or you are at high risk for heart disease, you may need your cholesterol levels checked more frequently.Ongoing high lipid and cholesterol levels should be treated with medicines if diet and exercise are not working.   If you smoke, find out from your health care provider how to quit. If you do not use tobacco, do not start.   Lung cancer screening is recommended for adults aged 72-80 years who are at high risk for developing lung cancer because of a history of smoking. A yearly low-dose CT scan of the lungs is recommended for people who have at least a 30-pack-year history of smoking and are a current smoker or have quit within the past 15 years. A pack year of smoking is smoking an average of 1 pack of cigarettes a day for 1 year (for example: 1 pack a day for 30 years or 2 packs a day for 15 years). Yearly screening should continue until the smoker has stopped smoking for at least 15 years. Yearly screening should be stopped for people who develop a  health problem that would prevent them from having lung cancer treatment.   If you are pregnant, do not drink alcohol. If you are breastfeeding, be very cautious about drinking alcohol. If you are not pregnant and choose to drink alcohol, do not have more than 1 drink per day. One drink is considered to be 12 ounces (355 mL) of beer, 5 ounces (148 mL) of wine, or 1.5 ounces (44 mL) of liquor.   Avoid use of street drugs. Do not share needles with anyone. Ask for help if you need support or instructions about stopping the use of  drugs.   High blood pressure causes heart disease and increases the risk of stroke. Your blood pressure should be checked at least yearly.  Ongoing high blood pressure should be treated with medicines if weight loss and exercise do not work.   If you are 69-55 years old, ask your health care provider if you should take aspirin to prevent strokes.   Diabetes screening involves taking a blood sample to check your fasting blood sugar level. This should be done once every 3 years, after age 38, if you are within normal weight and without risk factors for diabetes. Testing should be considered at a younger age or be carried out more frequently if you are overweight and have at least 1 risk factor for diabetes.   Breast cancer screening is essential preventive care for women. You should practice "breast self-awareness."  This means understanding the normal appearance and feel of your breasts and may include breast self-examination.  Any changes detected, no matter how small, should be reported to a health care provider.  Women in their 80s and 30s should have a clinical breast exam (CBE) by a health care provider as part of a regular health exam every 1 to 3 years.  After age 66, women should have a CBE every year.  Starting at age 1, women should consider having a mammogram (breast X-ray test) every year.  Women who have a family history of breast cancer should talk to their health care provider about genetic screening.  Women at a high risk of breast cancer should talk to their health care providers about having an MRI and a mammogram every year.   -Breast cancer gene (BRCA)-related cancer risk assessment is recommended for women who have family members with BRCA-related cancers. BRCA-related cancers include breast, ovarian, tubal, and peritoneal cancers. Having family members with these cancers may be associated with an increased risk for harmful changes (mutations) in the breast cancer genes BRCA1 and  BRCA2. Results of the assessment will determine the need for genetic counseling and BRCA1 and BRCA2 testing.   The Pap test is a screening test for cervical cancer. A Pap test can show cell changes on the cervix that might become cervical cancer if left untreated. A Pap test is a procedure in which cells are obtained and examined from the lower end of the uterus (cervix).   - Women should have a Pap test starting at age 57.   - Between ages 90 and 70, Pap tests should be repeated every 2 years.   - Beginning at age 63, you should have a Pap test every 3 years as long as the past 3 Pap tests have been normal.   - Some women have medical problems that increase the chance of getting cervical cancer. Talk to your health care provider about these problems. It is especially important to talk to your health care provider if a  new problem develops soon after your last Pap test. In these cases, your health care provider may recommend more frequent screening and Pap tests.   - The above recommendations are the same for women who have or have not gotten the vaccine for human papillomavirus (HPV).   - If you had a hysterectomy for a problem that was not cancer or a condition that could lead to cancer, then you no longer need Pap tests. Even if you no longer need a Pap test, a regular exam is a good idea to make sure no other problems are starting.   - If you are between ages 29 and 17 years, and you have had normal Pap tests going back 10 years, you no longer need Pap tests. Even if you no longer need a Pap test, a regular exam is a good idea to make sure no other problems are starting.   - If you have had past treatment for cervical cancer or a condition that could lead to cancer, you need Pap tests and screening for cancer for at least 20 years after your treatment.   - If Pap tests have been discontinued, risk factors (such as a new sexual partner) need to be reassessed to determine if screening should  be resumed.   - The HPV test is an additional test that may be used for cervical cancer screening. The HPV test looks for the virus that can cause the cell changes on the cervix. The cells collected during the Pap test can be tested for HPV. The HPV test could be used to screen women aged 92 years and older, and should be used in women of any age who have unclear Pap test results. After the age of 80, women should have HPV testing at the same frequency as a Pap test.   Colorectal cancer can be detected and often prevented. Most routine colorectal cancer screening begins at the age of 37 years and continues through age 26 years. However, your health care provider may recommend screening at an earlier age if you have risk factors for colon cancer. On a yearly basis, your health care provider may provide home test kits to check for hidden blood in the stool.  Use of a small camera at the end of a tube, to directly examine the colon (sigmoidoscopy or colonoscopy), can detect the earliest forms of colorectal cancer. Talk to your health care provider about this at age 62, when routine screening begins. Direct exam of the colon should be repeated every 5 -10 years through age 53 years, unless early forms of pre-cancerous polyps or small growths are found.   People who are at an increased risk for hepatitis B should be screened for this virus. You are considered at high risk for hepatitis B if:  -You were born in a country where hepatitis B occurs often. Talk with your health care provider about which countries are considered high risk.  - Your parents were born in a high-risk country and you have not received a shot to protect against hepatitis B (hepatitis B vaccine).  - You have HIV or AIDS.  - You use needles to inject street drugs.  - You live with, or have sex with, someone who has Hepatitis B.  - You get hemodialysis treatment.  - You take certain medicines for conditions like cancer, organ  transplantation, and autoimmune conditions.   Hepatitis C blood testing is recommended for all people born from [birth year] through 1965 and any individual  with known risks for hepatitis C.   Practice safe sex. Use condoms and avoid high-risk sexual practices to reduce the spread of sexually transmitted infections (STIs). STIs include gonorrhea, chlamydia, syphilis, trichomonas, herpes, HPV, and human immunodeficiency virus (HIV). Herpes, HIV, and HPV are viral illnesses that have no cure. They can result in disability, cancer, and death. Sexually active women aged 25 years and younger should be checked for chlamydia. Older women with new or multiple partners should also be tested for chlamydia. Testing for other STIs is recommended if you are sexually active and at increased risk.   Osteoporosis is a disease in which the bones lose minerals and strength with aging. This can result in serious bone fractures or breaks. The risk of osteoporosis can be identified using a bone density scan. Women ages 65 years and over and women at risk for fractures or osteoporosis should discuss screening with their health care providers. Ask your health care provider whether you should take a calcium supplement or vitamin D to There are also several preventive steps women can take to avoid osteoporosis and resulting fractures or to keep osteoporosis from worsening. -->Recommendations include:  Eat a balanced diet high in fruits, vegetables, calcium, and vitamins.  Get enough calcium. The recommended total intake of is 1,200 mg daily; for best absorption, if taking supplements, divide doses into 250-500 mg doses throughout the day. Of the two types of calcium, calcium carbonate is best absorbed when taken with food but calcium citrate can be taken on an empty stomach.  Get enough vitamin D. NAMS and the National Osteoporosis Foundation recommend at least 1,000 IU per day for women age 50 and over who are at risk of vitamin D  deficiency. Vitamin D deficiency can be caused by inadequate sun exposure (for example, those who live in northern latitudes).  Avoid alcohol and smoking. Heavy alcohol intake (more than 7 drinks per week) increases the risk of falls and hip fracture and women smokers tend to lose bone more rapidly and have lower bone mass than nonsmokers. Stopping smoking is one of the most important changes women can make to improve their health and decrease risk for disease.  Be physically active every day. Weight-bearing exercise (for example, fast walking, hiking, jogging, and weight training) may strengthen bones or slow the rate of bone loss that comes with aging. Balancing and muscle-strengthening exercises can reduce the risk of falling and fracture.  Consider therapeutic medications. Currently, several types of effective drugs are available. Healthcare providers can recommend the type most appropriate for each woman.  Eliminate environmental factors that may contribute to accidents. Falls cause nearly 90% of all osteoporotic fractures, so reducing this risk is an important bone-health strategy. Measures include ample lighting, removing obstructions to walking, using nonskid rugs on floors, and placing mats and/or grab bars in showers.  Be aware of medication side effects. Some common medicines make bones weaker. These include a type of steroid drug called glucocorticoids used for arthritis and asthma, some antiseizure drugs, certain sleeping pills, treatments for endometriosis, and some cancer drugs. An overactive thyroid gland or using too much thyroid hormone for an underactive thyroid can also be a problem. If you are taking these medicines, talk to your doctor about what you can do to help protect your bones.reduce the rate of osteoporosis.    Menopause can be associated with physical symptoms and risks. Hormone replacement therapy is available to decrease symptoms and risks. You should talk to your  health care provider   about whether hormone replacement therapy is right for you.   Use sunscreen. Apply sunscreen liberally and repeatedly throughout the day. You should seek shade when your shadow is shorter than you. Protect yourself by wearing long sleeves, pants, a wide-brimmed hat, and sunglasses year round, whenever you are outdoors.   Once a month, do a whole body skin exam, using a mirror to look at the skin on your back. Tell your health care provider of new moles, moles that have irregular borders, moles that are larger than a pencil eraser, or moles that have changed in shape or color.   -Stay current with required vaccines (immunizations).   Influenza vaccine. All adults should be immunized every year.  Tetanus, diphtheria, and acellular pertussis (Td, Tdap) vaccine. Pregnant women should receive 1 dose of Tdap vaccine during each pregnancy. The dose should be obtained regardless of the length of time since the last dose. Immunization is preferred during the 27th 36th week of gestation. An adult who has not previously received Tdap or who does not know her vaccine status should receive 1 dose of Tdap. This initial dose should be followed by tetanus and diphtheria toxoids (Td) booster doses every 10 years. Adults with an unknown or incomplete history of completing a 3-dose immunization series with Td-containing vaccines should begin or complete a primary immunization series including a Tdap dose. Adults should receive a Td booster every 10 years.  Varicella vaccine. An adult without evidence of immunity to varicella should receive 2 doses or a second dose if she has previously received 1 dose. Pregnant females who do not have evidence of immunity should receive the first dose after pregnancy. This first dose should be obtained before leaving the health care facility. The second dose should be obtained 4 8 weeks after the first dose.  Human papillomavirus (HPV) vaccine. Females aged 13 26  years who have not received the vaccine previously should obtain the 3-dose series. The vaccine is not recommended for use in pregnant females. However, pregnancy testing is not needed before receiving a dose. If a female is found to be pregnant after receiving a dose, no treatment is needed. In that case, the remaining doses should be delayed until after the pregnancy. Immunization is recommended for any person with an immunocompromised condition through the age of 26 years if she did not get any or all doses earlier. During the 3-dose series, the second dose should be obtained 4 8 weeks after the first dose. The third dose should be obtained 24 weeks after the first dose and 16 weeks after the second dose.  Zoster vaccine. One dose is recommended for adults aged 60 years or older unless certain conditions are present.  Measles, mumps, and rubella (MMR) vaccine. Adults born before 1957 generally are considered immune to measles and mumps. Adults born in 1957 or later should have 1 or more doses of MMR vaccine unless there is a contraindication to the vaccine or there is laboratory evidence of immunity to each of the three diseases. A routine second dose of MMR vaccine should be obtained at least 28 days after the first dose for students attending postsecondary schools, health care workers, or international travelers. People who received inactivated measles vaccine or an unknown type of measles vaccine during 1963 1967 should receive 2 doses of MMR vaccine. People who received inactivated mumps vaccine or an unknown type of mumps vaccine before 1979 and are at high risk for mumps infection should consider immunization with 2 doses of   MMR vaccine. For females of childbearing age, rubella immunity should be determined. If there is no evidence of immunity, females who are not pregnant should be vaccinated. If there is no evidence of immunity, females who are pregnant should delay immunization until after pregnancy.  Unvaccinated health care workers born before [birth year] who lack laboratory evidence of measles, mumps, or rubella immunity or laboratory confirmation of disease should consider measles and mumps immunization with 2 doses of MMR vaccine or rubella immunization with 1 dose of MMR vaccine.  Pneumococcal 13-valent conjugate (PCV13) vaccine. When indicated, a person who is uncertain of her immunization history and has no record of immunization should receive the PCV13 vaccine. An adult aged 54 years or older who has certain medical conditions and has not been previously immunized should receive 1 dose of PCV13 vaccine. This PCV13 should be followed with a dose of pneumococcal polysaccharide (PPSV23) vaccine. The PPSV23 vaccine dose should be obtained at least 8 weeks after the dose of PCV13 vaccine. An adult aged 58 years or older who has certain medical conditions and previously received 1 or more doses of PPSV23 vaccine should receive 1 dose of PCV13. The PCV13 vaccine dose should be obtained 1 or more years after the last PPSV23 vaccine dose.  Pneumococcal polysaccharide (PPSV23) vaccine. When PCV13 is also indicated, PCV13 should be obtained first. All adults aged 58 years and older should be immunized. An adult younger than age 65 years who has certain medical conditions should be immunized. Any person who resides in a nursing home or long-term care facility should be immunized. An adult smoker should be immunized. People with an immunocompromised condition and certain other conditions should receive both PCV13 and PPSV23 vaccines. People with human immunodeficiency virus (HIV) infection should be immunized as soon as possible after diagnosis. Immunization during chemotherapy or radiation therapy should be avoided. Routine use of PPSV23 vaccine is not recommended for American Indians, [place] Natives, or people younger than 65 years unless there are medical conditions that require PPSV23 vaccine. When indicated,  people who have unknown immunization and have no record of immunization should receive PPSV23 vaccine. One-time revaccination 5 years after the first dose of PPSV23 is recommended for people aged 70 64 years who have chronic kidney failure, nephrotic syndrome, asplenia, or immunocompromised conditions. People who received 1 2 doses of PPSV23 before age 32 years should receive another dose of PPSV23 vaccine at age 96 years or later if at least 5 years have passed since the previous dose. Doses of PPSV23 are not needed for people immunized with PPSV23 at or after age 55 years.  Meningococcal vaccine. Adults with asplenia or persistent complement component deficiencies should receive 2 doses of quadrivalent meningococcal conjugate (MenACWY-D) vaccine. The doses should be obtained at least 2 months apart. Microbiologists working with certain meningococcal bacteria, [employer] recruits, people at risk during an outbreak, and people who travel to or live in countries with a high rate of meningitis should be immunized. A first-year college student up through age 58 years who is living in a residence hall should receive a dose if she did not receive a dose on or after her 16th birthday. Adults who have certain high-risk conditions should receive one or more doses of vaccine.  Hepatitis A vaccine. Adults who wish to be protected from this disease, have certain high-risk conditions, work with hepatitis A-infected animals, work in hepatitis A research labs, or travel to or work in countries with a high rate of hepatitis A should be  immunized. Adults who were previously unvaccinated and who anticipate close contact with an international adoptee during the first 60 days after arrival in the [place] from a country with a high rate of hepatitis A should be immunized.  Hepatitis B vaccine.  Adults who wish to be protected from this disease, have certain high-risk conditions, may be exposed to blood or other infectious  body fluids, are household contacts or sex partners of hepatitis B positive people, are clients or workers in certain care facilities, or travel to or work in countries with a high rate of hepatitis B should be immunized.  Haemophilus influenzae type b (Hib) vaccine. A previously unvaccinated person with asplenia or sickle cell disease or having a scheduled splenectomy should receive 1 dose of Hib vaccine. Regardless of previous immunization, a recipient of a hematopoietic stem cell transplant should receive a 3-dose series 6 12 months after her successful transplant. Hib vaccine is not recommended for adults with HIV infection.  Preventive Services / Frequency Ages 37 to 39years  Blood pressure check.** / Every 1 to 2 years.  Lipid and cholesterol check.** / Every 5 years beginning at age 26.  Clinical breast exam.** / Every 3 years for women in their 52s and 79s.  BRCA-related cancer risk assessment.** / For women who have family members with a BRCA-related cancer (breast, ovarian, tubal, or peritoneal cancers).  Pap test.** / Every 2 years from ages 87 through 95. Every 3 years starting at age 69 through age 47 or 80 with a history of 3 consecutive normal Pap tests.  HPV screening.** / Every 3 years from ages 60 through ages 69 to 67 with a history of 3 consecutive normal Pap tests.  Hepatitis C blood test.** / For any individual with known risks for hepatitis C.  Skin self-exam. / Monthly.  Influenza vaccine. / Every year.  Tetanus, diphtheria, and acellular pertussis (Tdap, Td) vaccine.** / Consult your health care provider. Pregnant women should receive 1 dose of Tdap vaccine during each pregnancy. 1 dose of Td every 10 years.  Varicella vaccine.** / Consult your health care provider. Pregnant females who do not have evidence of immunity should receive the first dose after pregnancy.  HPV vaccine. / 3 doses over 6 months, if 49 and younger. The vaccine is not recommended for use in  pregnant females. However, pregnancy testing is not needed before receiving a dose.  Measles, mumps, rubella (MMR) vaccine.** / You need at least 1 dose of MMR if you were born in 1957 or later. You may also need a 2nd dose. For females of childbearing age, rubella immunity should be determined. If there is no evidence of immunity, females who are not pregnant should be vaccinated. If there is no evidence of immunity, females who are pregnant should delay immunization until after pregnancy.  Pneumococcal 13-valent conjugate (PCV13) vaccine.** / Consult your health care provider.  Pneumococcal polysaccharide (PPSV23) vaccine.** / 1 to 2 doses if you smoke cigarettes or if you have certain conditions.  Meningococcal vaccine.** / 1 dose if you are age 53 to 45 years and a Market researcher living in a residence hall, or have one of several medical conditions, you need to get vaccinated against meningococcal disease. You may also need additional booster doses.  Hepatitis A vaccine.** / Consult your health care provider.  Hepatitis B vaccine.** / Consult your health care provider.  Haemophilus influenzae type b (Hib) vaccine.** / Consult your health care provider.  Ages 9 to 72years  Blood pressure check.** / Every 1 to 2 years.  Lipid and cholesterol check.** / Every 5 years beginning at age 20 years.  Lung cancer screening. / Every year if you are aged 55 80 years and have a 30-pack-year history of smoking and currently smoke or have quit within the past 15 years. Yearly screening is stopped once you have quit smoking for at least 15 years or develop a health problem that would prevent you from having lung cancer treatment.  Clinical breast exam.** / Every year after age 40 years.  BRCA-related cancer risk assessment.** / For women who have family members with a BRCA-related cancer (breast, ovarian, tubal, or peritoneal cancers).  Mammogram.** / Every year beginning at age 40  years and continuing for as long as you are in good health. Consult with your health care provider.  Pap test.** / Every 3 years starting at age 30 years through age 65 or 70 years with a history of 3 consecutive normal Pap tests.  HPV screening.** / Every 3 years from ages 30 years through ages 65 to 70 years with a history of 3 consecutive normal Pap tests.  Fecal occult blood test (FOBT) of stool. / Every year beginning at age 50 years and continuing until age 75 years. You may not need to do this test if you get a colonoscopy every 10 years.  Flexible sigmoidoscopy or colonoscopy.** / Every 5 years for a flexible sigmoidoscopy or every 10 years for a colonoscopy beginning at age 50 years and continuing until age 75 years.  Hepatitis C blood test.** / For all people born from 1945 through 1965 and any individual with known risks for hepatitis C.  Skin self-exam. / Monthly.  Influenza vaccine. / Every year.  Tetanus, diphtheria, and acellular pertussis (Tdap/Td) vaccine.** / Consult your health care provider. Pregnant women should receive 1 dose of Tdap vaccine during each pregnancy. 1 dose of Td every 10 years.  Varicella vaccine.** / Consult your health care provider. Pregnant females who do not have evidence of immunity should receive the first dose after pregnancy.  Zoster vaccine.** / 1 dose for adults aged 60 years or older.  Measles, mumps, rubella (MMR) vaccine.** / You need at least 1 dose of MMR if you were born in 1957 or later. You may also need a 2nd dose. For females of childbearing age, rubella immunity should be determined. If there is no evidence of immunity, females who are not pregnant should be vaccinated. If there is no evidence of immunity, females who are pregnant should delay immunization until after pregnancy.  Pneumococcal 13-valent conjugate (PCV13) vaccine.** / Consult your health care provider.  Pneumococcal polysaccharide (PPSV23) vaccine.** / 1 to 2 doses if  you smoke cigarettes or if you have certain conditions.  Meningococcal vaccine.** / Consult your health care provider.  Hepatitis A vaccine.** / Consult your health care provider.  Hepatitis B vaccine.** / Consult your health care provider.  Haemophilus influenzae type b (Hib) vaccine.** / Consult your health care provider.  Ages 65 years and over  Blood pressure check.** / Every 1 to 2 years.  Lipid and cholesterol check.** / Every 5 years beginning at age 20 years.  Lung cancer screening. / Every year if you are aged 55 80 years and have a 30-pack-year history of smoking and currently smoke or have quit within the past 15 years. Yearly screening is stopped once you have quit smoking for at least 15 years or develop a health problem that   would prevent you from having lung cancer treatment.  Clinical breast exam.** / Every year after age 103 years.  BRCA-related cancer risk assessment.** / For women who have family members with a BRCA-related cancer (breast, ovarian, tubal, or peritoneal cancers).  Mammogram.** / Every year beginning at age 36 years and continuing for as long as you are in good health. Consult with your health care provider.  Pap test.** / Every 3 years starting at age 5 years through age 85 or 10 years with 3 consecutive normal Pap tests. Testing can be stopped between 65 and 70 years with 3 consecutive normal Pap tests and no abnormal Pap or HPV tests in the past 10 years.  HPV screening.** / Every 3 years from ages 93 years through ages 70 or 45 years with a history of 3 consecutive normal Pap tests. Testing can be stopped between 65 and 70 years with 3 consecutive normal Pap tests and no abnormal Pap or HPV tests in the past 10 years.  Fecal occult blood test (FOBT) of stool. / Every year beginning at age 8 years and continuing until age 45 years. You may not need to do this test if you get a colonoscopy every 10 years.  Flexible sigmoidoscopy or colonoscopy.** /  Every 5 years for a flexible sigmoidoscopy or every 10 years for a colonoscopy beginning at age 69 years and continuing until age 68 years.  Hepatitis C blood test.** / For all people born from [birth year] through 1966 and any individual with known risks for hepatitis C.  Osteoporosis screening.** / A one-time screening for women ages 7 years and over and women at risk for fractures or osteoporosis.  Skin self-exam. / Monthly.  Influenza vaccine. / Every year.  Tetanus, diphtheria, and acellular pertussis (Tdap/Td) vaccine.** / 1 dose of Td every 10 years.  Varicella vaccine.** / Consult your health care provider.  Zoster vaccine.** / 1 dose for adults aged 5 years or older.  Pneumococcal 13-valent conjugate (PCV13) vaccine.** / Consult your health care provider.  Pneumococcal polysaccharide (PPSV23) vaccine.** / 1 dose for all adults aged 74 years and older.  Meningococcal vaccine.** / Consult your health care provider.  Hepatitis A vaccine.** / Consult your health care provider.  Hepatitis B vaccine.** / Consult your health care provider.  Haemophilus influenzae type b (Hib) vaccine.** / Consult your health care provider. ** Family history and personal history of risk and conditions may change your health care provider's recommendations. Document Released: 01/17/2002 Document Revised: 09/11/2013  [facility] Patient Information 2014 [place], [place].   EXERCISE AND DIET:  We recommended that you start or continue a regular exercise program for good health. Regular exercise means any activity that makes your heart beat faster and makes you sweat.  We recommend exercising at least 30 minutes per day at least 3 days a week, preferably 5.  We also recommend a diet low in fat and sugar / carbohydrates.  Inactivity, poor dietary choices and obesity can cause diabetes, heart attack, stroke, and kidney damage, among others.     ALCOHOL AND SMOKING:  Women should limit their alcohol intake to no  more than 7 drinks/beers/glasses of wine (combined, not each!) per week. Moderation of alcohol intake to this level decreases your risk of breast cancer and liver damage.  ( And of course, no recreational drugs are part of a healthy lifestyle.)  Also, you should not be smoking at all or even being exposed to second hand smoke. Most people know smoking can  cause cancer, and various heart and lung diseases, but did you know it also contributes to weakening of your bones?  Aging of your skin?  Yellowing of your teeth and nails?   CALCIUM AND VITAMIN D:  Adequate intake of calcium and Vitamin D are recommended.  The recommendations for exact amounts of these supplements seem to change often, but generally speaking 600 mg of calcium (either carbonate or citrate) and 800 units of Vitamin D per day seems prudent. Certain women may benefit from higher intake of Vitamin D.  If you are among these women, your doctor will have told you during your visit.     PAP SMEARS:  Pap smears, to check for cervical cancer or precancers,  have traditionally been done yearly, although recent scientific advances have shown that most women can have pap smears less often.  However, every woman still should have a physical exam from her gynecologist or primary care physician every year. It will include a breast check, inspection of the vulva and vagina to check for abnormal growths or skin changes, a visual exam of the cervix, and then an exam to evaluate the size and shape of the uterus and ovaries.  And after 78 years of age, a rectal exam is indicated to check for rectal cancers. We will also provide age appropriate advice regarding health maintenance, like when you should have certain vaccines, screening for sexually transmitted diseases, bone density testing, colonoscopy, mammograms, etc.    MAMMOGRAMS:  All women over 65 years old should have a yearly mammogram. Many facilities now offer a "3D" mammogram, which may cost  around $50 extra out of pocket. If possible,  we recommend you accept the option to have the 3D mammogram performed.  It both reduces the number of women who will be called back for extra views which then turn out to be normal, and it is better than the routine mammogram at detecting truly abnormal areas.     COLONOSCOPY:  Colonoscopy to screen for colon cancer is recommended for all women at age 26.  We know, you hate the idea of the prep.  We agree, BUT, having colon cancer and not knowing it is worse!!  Colon cancer so often starts as a polyp that can be seen and removed at colonscopy, which can quite literally save your life!  And if your first colonoscopy is normal and you have no family history of colon cancer, most women don't have to have it again for 10 years.  Once every ten years, you can do something that may end up saving your life, right?  We will be happy to help you get it scheduled when you are ready.  Be sure to check your insurance coverage so you understand how much it will cost.  It may be covered as a preventative service at no cost, but you should check your particular policy.    Mediterranean Diet A Mediterranean diet refers to food and lifestyle choices that are based on the traditions of countries located on the [employer]. This way of eating has been shown to help prevent certain conditions and improve outcomes for people who have chronic diseases, like kidney disease and heart disease. What are tips for following this plan? Lifestyle  Cook and eat meals together with your family, when possible.  Drink enough fluid to keep your urine clear or pale yellow.  Be physically active every day. This includes: ? Aerobic exercise like running or swimming. ? Leisure activities like  gardening, walking, or housework.  Get 7-8 hours of sleep each night.  If recommended by your health care provider, drink red wine in moderation. This means 1 glass a day for nonpregnant women  and 2 glasses a day for men. A glass of wine equals 5 oz (150 mL). Reading food labels  Check the serving size of packaged foods. For foods such as rice and pasta, the serving size refers to the amount of cooked product, not dry.  Check the total fat in packaged foods. Avoid foods that have saturated fat or trans fats.  Check the ingredients list for added sugars, such as corn syrup. Shopping  At the grocery store, buy most of your food from the areas near the walls of the store. This includes: ? Fresh fruits and vegetables (produce). ? Grains, beans, nuts, and seeds. Some of these may be available in unpackaged forms or large amounts (in bulk). ? Fresh seafood. ? Poultry and eggs. ? Low-fat dairy products.  Buy whole ingredients instead of prepackaged foods.  Buy fresh fruits and vegetables in-season from local farmers markets.  Buy frozen fruits and vegetables in resealable bags.  If you do not have access to quality fresh seafood, buy precooked frozen shrimp or canned fish, such as tuna, salmon, or sardines.  Buy small amounts of raw or cooked vegetables, salads, or olives from the deli or salad bar at your store.  Stock your pantry so you always have certain foods on hand, such as olive oil, canned tuna, canned tomatoes, rice, pasta, and beans. Cooking  Cook foods with extra-virgin olive oil instead of using butter or other vegetable oils.  Have meat as a side dish, and have vegetables or grains as your main dish. This means having meat in small portions or adding small amounts of meat to foods like pasta or stew.  Use beans or vegetables instead of meat in common dishes like chili or lasagna.  Experiment with different cooking methods. Try roasting or broiling vegetables instead of steaming or sauteing them.  Add frozen vegetables to soups, stews, pasta, or rice.  Add nuts or seeds for added healthy fat at each meal. You can add these to yogurt, salads, or vegetable  dishes.  Marinate fish or vegetables using olive oil, lemon juice, garlic, and fresh herbs. Meal planning  Plan to eat 1 vegetarian meal one day each week. Try to work up to 2 vegetarian meals, if possible.  Eat seafood 2 or more times a week.  Have healthy snacks readily available, such as: ? Vegetable sticks with hummus. ? [place] yogurt. ? Fruit and nut trail mix.  Eat balanced meals throughout the week. This includes: ? Fruit: 2-3 servings a day ? Vegetables: 4-5 servings a day ? Low-fat dairy: 2 servings a day ? Fish, poultry, or lean meat: 1 serving a day ? Beans and legumes: 2 or more servings a week ? Nuts and seeds: 1-2 servings a day ? Whole grains: 6-8 servings a day ? Extra-virgin olive oil: 3-4 servings a day  Limit red meat and sweets to only a few servings a month What are my food choices?  Mediterranean diet ? Recommended ? Grains: Whole-grain pasta. Brown rice. Bulgar wheat. Polenta. Couscous. Whole-wheat bread. [place]. ? Vegetables: Artichokes. Beets. Broccoli. Cabbage. Carrots. Eggplant. Green beans. Chard. Kale. Spinach. Onions. Leeks. Peas. Squash. Tomatoes. Peppers. Radishes. ? Fruits: Apples. Apricots. Avocado. Berries. Bananas. Cherries. Dates. Figs. Grapes. Lemons. Melon. Oranges. Peaches. Plums. Pomegranate. ? Meats and other protein  foods: Beans. Almonds. Sunflower seeds. Pine nuts. Peanuts. [place]. Salmon. Scallops. Shrimp. [place]. Tilapia. Clams. Oysters. Eggs. ? Dairy: Low-fat milk. Cheese. Greek yogurt. ? Beverages: Water. Red wine. Herbal tea. ? Fats and oils: Extra virgin olive oil. Avocado oil. Grape seed oil. ? Sweets and desserts: [place] yogurt with honey. Baked apples. Poached pears. Trail mix. ? Seasoning and other foods: Basil. Cilantro. Coriander. Cumin. Mint. Parsley. Sage. Rosemary. Tarragon. Garlic. Oregano. Thyme. Pepper. Balsalmic vinegar. Tahini. Hummus. Tomato sauce. Olives. Mushrooms. ? Limit these ? Grains: Prepackaged pasta or  rice dishes. Prepackaged cereal with added sugar. ? Vegetables: Deep fried potatoes (french fries). ? Fruits: Fruit canned in syrup. ? Meats and other protein foods: Beef. Pork. Lamb. Poultry with skin. Hot dogs. [place] Salines. ? Dairy: Ice cream. Sour cream. Whole milk. ? Beverages: Juice. Sugar-sweetened soft drinks. Beer. Liquor and spirits. ? Fats and oils: Butter. Canola oil. Vegetable oil. Beef fat (tallow). Lard. ? Sweets and desserts: Cookies. Cakes. Pies. Candy. ? Seasoning and other foods: Mayonnaise. Premade sauces and marinades. ? The items listed may not be a complete list. Talk with your dietitian about what dietary choices are right for you. Summary  The Mediterranean diet includes both food and lifestyle choices.  Eat a variety of fresh fruits and vegetables, beans, nuts, seeds, and whole grains.  Limit the amount of red meat and sweets that you eat.  Talk with your health care provider about whether it is safe for you to drink red wine in moderation. This means 1 glass a day for nonpregnant women and 2 glasses a day for men. A glass of wine equals 5 oz (150 mL). This information is not intended to replace advice given to you by your health care provider. Make sure you discuss any questions you have with your health care provider. Document Released: 07/14/2016 Document Revised: 08/16/2016 Document Reviewed: 07/14/2016 Elsevier Interactive Patient Education  [employer].  Please continue all medications as directed. Continue to stay well hydrated. Will recheck kidney function at follow-up Increase regular movement as tolerated. Continue follow-up with Oncology as directed. Follow-up in 6 months. NICE TO SEE YOU!

## 2018-04-23 NOTE — Assessment & Plan Note (Signed)
The 10-year ASCVD risk score Mikey Bussing DC Brooke Bonito., et al., 2013) is: 23.5%   Values used to calculate the score:     Age: 77 years     Sex: Female     Is Non-Hispanic African American: No     Diabetic: No     Tobacco smoker: No     Systolic Blood Pressure: 340 mmHg     Is BP treated: Yes     HDL Cholesterol: 42 mg/dL     Total Cholesterol: 169 mg/dL  [id number] Will continue to monitor

## 2018-04-23 NOTE — Assessment & Plan Note (Signed)
>>  ASSESSMENT AND PLAN FOR CHRONIC KIDNEY DISEASE WRITTEN ON 04/23/2018  8:37 AM BY DANFORD, KATY D, NP  Estimated Creatinine Clearance: 48.5 mL/min (by C-G formula based on SCr of 1 mg/dL).  04/16/18 GFR 54, stable Will recheck CMP at f/u

## 2018-04-23 NOTE — Progress Notes (Signed)
Subjective:    Patient ID: Cindy Byrd, female    DOB: June 15, 1941, 77 y.o.   MRN: [id number]  HPI: 01/15/18 OV: s. [name] is here to establish as a new pt.  She is a pleasant 77 year old female.  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  She has 120 pack year hx, last tobacco use 2016. Current tx: 1) Active surveillance 2) Xgeva 136m [place] q4weeks for bone metastases. 3) Sandostatin q4weeks for diarrhea - immune colitis She has CT scan every 2 months/regular follow-up with Oncology/Dr. KIrene Limbo last OV was last week.  Per pt Dr. KIrene Limbocalls her condition "Stable, not remission". She reports medication compliance and denies SE.  She denies CP/dyspnea/palpiations.  She has L hip pain that limits her exercising and prolonged walking. She reports excellent support system of local friends/family and denies thoughts of harming himself/others.  04/23/18 OV: [name] for CPE She reports medication compliance, denies SE She has CT scan scheduled this Thursday She reports baseline dyspnea stable Reviewed all recent labs at length, CKD stable She has been trying to increase regular movement, ie gardening   Healthcare Maintanance: PAP- Not indicated Mammogram- Not indicated Colonoscopy- Not indicated Immunizations- declined  Patient Care Team    Relationship Specialty Notifications Start End  DMina MarbleD, NP PCP - General Family Medicine  01/15/18   KBrunetta Genera MD Consulting Physician Hematology and Oncology Admissions 06/24/15     Patient Active Problem List   Diagnosis Date Noted  . Hyperlipidemia 04/23/2018  . Chronic kidney disease 04/23/2018  . Elevated TSH 04/17/2018  . Cough 02/21/2018  . Elevated serum creatinine 02/21/2018  . Health care maintenance 01/15/2018  . Malignant neoplasm of lung ([facility] [place]   . Colitis 06/23/2017  . Left lower quadrant pain 06/23/2017  . Fracture  of left iliac crest ([facility] 06/23/2017  . Metastatic lung cancer (metastasis from lung to other site), unspecified laterality ([facility] [place]   . Hypocalcemia 09/08/2016  . Vitamin D deficiency 09/08/2016  . New onset a-fib ([facility] 09/08/2016  . Chest pain   . Thrush of mouth and esophagus ([facility]   . Protein-calorie malnutrition, severe ([facility]   . Colitis determined by colorectal biopsy   . Bilateral pulmonary embolism ([facility] 09/07/2016  . DVT of lower extremity, bilateral ([facility] 09/07/2016  . Palliative care by specialist   . Goals of care, counseling/discussion   . Advance care planning   . Diarrhea   . Ulcerative pancolitis without complication ([facility]   . Cancer ([facility]   . Pressure injury of skin 09/02/2016  . HCAP (healthcare-associated pneumonia) 09/01/2016  . ARF (acute renal failure) ([facility] 09/01/2016  . Nausea with vomiting 07/18/2016  . Dehydration 07/18/2016  . Hypokalemia 07/18/2016  . Hypoalbuminemia due to protein-calorie malnutrition ([facility] 07/18/2016  . Peripheral edema 07/18/2016  . Diarrhea due to drug 05/19/2016  . Port catheter in place 04/07/2016  . Nicotine addiction 01/19/2016  . Barrett's esophagus determined by biopsy 09/04/2015  . Gastritis   . Primary malignant neoplasm of lung metastatic to other site ([facility] 08/06/2015  . Esophageal reflux 08/04/2015  . Bone metastasis ([facility] 06/24/2015  . Neoplasm related pain 06/24/2015  . Protein calorie malnutrition ([facility] 06/24/2015  . Anorexia 06/24/2015  . Heavy smoker (more than 20 cigarettes per day) 06/24/2015  . Depression 06/24/2015  . HTN (hypertension) 06/24/2015     Past Medical History:  Diagnosis Date  . Barrett's  esophagus   . Bone neoplasm 06/24/2015  . Cancer [facility])    metastatic poorly differentiated carcinoma. tumor left groin surgical removal with radiation tx.  . Cataract    BILATERAL  . Cigarette smoker two packs a day or less    Currently still smoking 2 PPD - Not interested in quitting at this time.  .  Colitis 2017  . Colon polyps    hyperplastic, tubular adenomas, tubulovillous adenoma  . Cough, persistent    hx. lung cancer ? primary-being evaluated, unsure of primary site.  . Depression 06/24/2015  . Diverticulosis   . Emphysema of lung ([place])   . Endometriosis    Hysterectomy with BSO at age 43 yrs  . Esophageal adenocarcinoma ([place]) 08/11/15   intramucosal  . Gastritis   . GERD (gastroesophageal reflux disease)   . H/O: pneumonia   . Hiatal hernia   . Hyperlipidemia   . Hypertension 06/24/2015   likely improved incidental to 40 lbs weight loss from her neoplasm. No Longer taking med for this as of 08-06-15  . IBS (irritable bowel syndrome)   . Pain    left hip-persistent"tumor of bone"-radiation tx. 10.  . Vitamin D deficiency disease      Past Surgical History:  Procedure Laterality Date  . BARTHOLIN GLAND CYST EXCISION  77 yo ago   Does not want if it was an infected cyst or tumor. Was soon as delivery  . COLONOSCOPY W/ POLYPECTOMY     multiple times - last done 09/2014 per patient.  . ESOPHAGOGASTRODUODENOSCOPY (EGD) WITH PROPOFOL N/A 08/11/2015   Procedure: ESOPHAGOGASTRODUODENOSCOPY (EGD) WITH PROPOFOL;  Surgeon: [name], MD;  Location: [facility];  Service: Gastroenterology;  Laterality: N/A;  . FLEXIBLE SIGMOIDOSCOPY N/A 06/24/2017   Procedure: FLEXIBLE SIGMOIDOSCOPY;  Surgeon: Manus Gunning, MD;  Location: [facility];  Service: Gastroenterology;  Laterality: N/A;  . GANGLION CYST EXCISION    . KNEE ARTHROSCOPY  age about 20 yrs  . TONSILLECTOMY    . TOTAL ABDOMINAL HYSTERECTOMY W/ BILATERAL SALPINGOOPHORECTOMY  at age 63 yrs   For endometriosis     Family History  Problem Relation Age of Onset  . Colon cancer Brother   . Colon cancer Brother   . Stroke Mother   . Colon cancer Father   . Breast cancer Daughter 72       ER/PR+ stage II     Social History   Substance and Sexual Activity  Drug Use No     Social History   Substance and  Sexual Activity  Alcohol Use No  . Alcohol/week: 0.0 oz     Social History   Tobacco Use  Smoking Status Former Smoker  . Packs/day: 1.00  . Years: 60.00  . Pack years: 60.00  . Types: Cigarettes  . Last attempt to quit: 12/06/2015  . Years since quitting: 2.3  Smokeless Tobacco Never Used     Outpatient Encounter Medications as of 04/23/2018  Medication Sig  . amLODipine (NORVASC) 10 MG tablet Take 1 tablet (10 mg total) by mouth daily.  . calcium-vitamin D (OSCAL WITH D) 500-200 MG-UNIT tablet Take 2 tablets by mouth 3 (three) times daily.  [name] lidocaine-prilocaine (EMLA) cream Apply small amount over port 1-2 hours prior to treatment, cover with plastic wrap (DO NOT RUB IN).  . LORazepam (ATIVAN) 1 MG tablet Take 1 tablet (1 mg total) by mouth every 8 (eight) hours as needed for anxiety (or nausea).  [name] omeprazole (PRILOSEC) 40 MG capsule TAKE  1 CAPSULE BY MOUTH EVERY DAY  . ondansetron (ZOFRAN) 8 MG tablet Take 1 tablet (8 mg total) by mouth every 8 (eight) hours as needed for nausea or vomiting.  . rivaroxaban (XARELTO) 20 MG TABS tablet Take 1 tablet (20 mg total) by mouth daily with supper. Discontinue lovenox and start Xarelto at the scheduled time as instructed.  . saccharomyces boulardii (FLORASTOR) 250 MG capsule Take 1 capsule (250 mg total) by mouth 2 (two) times daily.  . traZODone (DESYREL) 50 MG tablet Take 1 tablet (50 mg total) by mouth at bedtime.  . Vitamin D, Ergocalciferol, (DRISDOL) 50000 units CAPS capsule Take 50,000 Units by mouth every 7 (seven) days.  . [DISCONTINUED] azithromycin (ZITHROMAX) 250 MG tablet 2 tabs day one.  1 tab days 2-5   No facility-administered encounter medications on file as of 04/23/2018.     Allergies: Penicillins; Remeron [mirtazapine]; and Latex  Body mass index is 22.18 kg/m.  Blood pressure 117/60, pulse 67, height _0  (1.753 m), weight 150 lb 3.2 oz (68.1 kg), SpO2 94 %.  Review of Systems  Constitutional: Positive for  fatigue. Negative for activity change, appetite change, chills, diaphoresis, fever and unexpected weight change.  HENT: Negative for congestion.   Eyes: Negative for visual disturbance.  Respiratory: Negative for cough, chest tightness, shortness of breath, wheezing and stridor.   Cardiovascular: Negative for chest pain, palpitations and leg swelling.  Gastrointestinal: Negative for abdominal distention, abdominal pain, blood in stool, constipation, diarrhea, nausea and vomiting.  Genitourinary: Negative for difficulty urinating and flank pain.  Musculoskeletal: Positive for arthralgias, gait problem and myalgias. Negative for back pain, joint swelling, neck pain and neck stiffness.  Skin: Negative for color change, pallor, rash and wound.  Neurological: Negative for dizziness and headaches.  Hematological: Does not bruise/bleed easily.  Psychiatric/Behavioral: Negative for dysphoric mood, hallucinations, self-injury, sleep disturbance and suicidal ideas. The patient is not nervous/anxious and is not hyperactive.        Objective:   Physical Exam  Constitutional: She is oriented to person, place, and time. She appears well-developed and well-nourished. No distress.  HENT:  Head: Normocephalic and atraumatic.  Right Ear: External ear normal. Tympanic membrane is not erythematous and not bulging. No decreased hearing is noted.  Left Ear: External ear normal. Tympanic membrane is not erythematous and not bulging. No decreased hearing is noted.  Nose: No mucosal edema or rhinorrhea. Right sinus exhibits no maxillary sinus tenderness and no frontal sinus tenderness. Left sinus exhibits no maxillary sinus tenderness and no frontal sinus tenderness.  Mouth/Throat: Oropharynx is clear and moist and mucous membranes are normal. Abnormal dentition. No dental caries.  Eyes: Pupils are equal, round, and reactive to light. Conjunctivae are normal.  Neck: Normal range of motion. Neck supple.   Cardiovascular: Normal rate, regular rhythm, normal heart sounds and intact distal pulses.  No murmur heard. Pulmonary/Chest: Effort normal. No respiratory distress. She has no decreased breath sounds. She has no wheezes. She has no rhonchi. She has no rales. She exhibits no tenderness. Right breast exhibits no inverted nipple, no mass, no nipple discharge, no skin change and no tenderness. Left breast exhibits no inverted nipple, no mass, no nipple discharge, no skin change and no tenderness.  R anterior chest port-a-cath  Abdominal: Soft. Bowel sounds are normal. She exhibits no distension and no mass. There is no tenderness. There is no rebound and no guarding.  Musculoskeletal: Normal range of motion. She exhibits tenderness. She exhibits no edema.  Right ankle: She exhibits swelling.       Left ankle: She exhibits swelling.  Very slight edema of bil ankles  Lymphadenopathy:    She has no cervical adenopathy.  Neurological: She is alert and oriented to person, place, and time. Coordination normal.  Skin: Skin is warm and dry. No rash noted. She is not diaphoretic. No erythema. No pallor.  Psychiatric: She has a normal mood and affect. Her behavior is normal. Judgment and thought content normal.  Nursing note and vitals reviewed.     Assessment & Plan:   1. Essential hypertension   2. Health care maintenance   3. Hyperlipidemia, unspecified hyperlipidemia type   4. Chronic kidney disease, unspecified CKD stage     HTN (hypertension) BP at goal 117/60 HR 67 Continue amlodipine 75m QD  Health care maintenance Please continue all medications as directed. Continue to stay well hydrated. Will recheck kidney function at follow-up Increase regular movement as tolerated. Continue follow-up with Oncology as directed. Follow-up in 6 months.  Hyperlipidemia The 10-year ASCVD risk score (Mikey BussingDC Jr., et al., 2013) is: 23.5%   Values used to calculate the score:     Age: [age] years     Sex: Female     Is Non-Hispanic African American: No     Diabetic: No     Tobacco smoker: No     Systolic Blood Pressure: 1496mmHg     Is BP treated: Yes     HDL Cholesterol: 42 mg/dL     Total Cholesterol: 169 mg/dL  LDL-107 Will continue to monitor  Chronic kidney disease Estimated Creatinine Clearance: 48.5 mL/min (by C-G formula based on SCr of 1 mg/dL).  04/16/18 GFR 54, stable Will recheck CMP at f/u     FOLLOW-UP:  Return in about 6 months (around 10/24/2018) for Regular Follow Up, Re-Check CMP.

## 2018-04-23 NOTE — Assessment & Plan Note (Signed)
Please continue all medications as directed. Continue to stay well hydrated. Will recheck kidney function at follow-up Increase regular movement as tolerated. Continue follow-up with Oncology as directed. Follow-up in 6 months.

## 2018-04-23 NOTE — Assessment & Plan Note (Signed)
Estimated Creatinine Clearance: 48.5 mL/min (by C-G formula based on SCr of 1 mg/dL).  04/16/18 GFR 54, stable Will recheck CMP at f/u

## 2018-04-23 NOTE — Assessment & Plan Note (Signed)
BP at goal 117/60 HR 67 Continue amlodipine 10mg  QD

## 2018-04-26 ENCOUNTER — Ambulatory Visit (HOSPITAL_COMMUNITY)
Admission: RE | Admit: 2018-04-26 | Discharge: 2018-04-26 | Disposition: A | Discharge status: Home / Self Care | Payer: Medicare Other | Source: Ambulatory Visit | Attending: Hematology | Admitting: Hematology

## 2018-04-26 ENCOUNTER — Other Ambulatory Visit: Payer: Medicare Other

## 2018-04-26 DIAGNOSIS — K449 Diaphragmatic hernia without obstruction or gangrene: Secondary | ICD-10-CM | POA: Insufficient documentation

## 2018-04-26 DIAGNOSIS — M899 Disorder of bone, unspecified: Secondary | ICD-10-CM | POA: Diagnosis not present

## 2018-04-26 DIAGNOSIS — C7951 Secondary malignant neoplasm of bone: Secondary | ICD-10-CM | POA: Diagnosis not present

## 2018-04-26 DIAGNOSIS — C159 Malignant neoplasm of esophagus, unspecified: Secondary | ICD-10-CM | POA: Diagnosis not present

## 2018-04-26 DIAGNOSIS — I700 Atherosclerosis of aorta: Secondary | ICD-10-CM | POA: Diagnosis not present

## 2018-04-26 DIAGNOSIS — R197 Diarrhea, unspecified: Secondary | ICD-10-CM

## 2018-04-26 DIAGNOSIS — J432 Centrilobular emphysema: Secondary | ICD-10-CM | POA: Insufficient documentation

## 2018-04-26 DIAGNOSIS — C3491 Malignant neoplasm of unspecified part of right bronchus or lung: Secondary | ICD-10-CM | POA: Diagnosis not present

## 2018-04-26 MED ORDER — IOPAMIDOL (ISOVUE-300) INJECTION 61%
100.0000 mL | Freq: Once | INTRAVENOUS | Status: AC | PRN
  Administered 2018-04-26: 100 mL via INTRAVENOUS

## 2018-04-26 MED ORDER — IOPAMIDOL (ISOVUE-300) INJECTION 61%
INTRAVENOUS | Status: AC
  Filled 2018-04-26: qty 100

## 2018-05-02 NOTE — Progress Notes (Signed)
Cindy Byrd    HEMATOLOGY ONCOLOGY PROGRESS NOTE  Date of service: 05/03/18   Patient Care Team: [name], NP as PCP - General (Family Medicine) [name], MD as Consulting Physician (Hematology and Oncology)  CHIEF COMPLAINTS/PURPOSE OF CONSULTATION:  F/u for metastatic lung cancer  DIAGNOSIS:   #1 Metastatic non-small cell lung cancer with bilateral lung nodules and large metastatic lesion in the left Ilium. #2 Barrett's esophagus with some evidence of intramucosal adenocarcinoma of the esophagus. 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 Also had c diff colitis - treated   Current Treatment  1) Active surveillance 2) Xgeva 15m [place] q4weeks for bone metastases. 3) Sandostatin q4weeks for diarrhea - immune colitis  Previous Treatment  1 Palliative radiation therapy to the large left ilium metastases 2. IV Nivolumab x 20ycles (discontinued due to likely immune colitis) 3. Xgeva 1247mSC q4weeks for bone metastases.   HISTORY OF PRESENTING ILLNESS: (plz see my previous consultation for details of initial presentation)  INTERVAL HISTORY:   [name] here for her scheduled follow-up for metastatic lung cancer. The patient's last visit with us[place] on 03/08/18. The pt reports that she is doing well overall and enjoyed her recent high school class reunion.   The pt reports that her diarrhea has been stable and she continues to take her Sandostatin. She notes that she doesn't have much energy but is still enjoying life. She also notes that her right hip occasionally experiences some sharp pain though she does not associate the onset with anything in particular. She describes that her pain will be present for about 30 minutes and then naturally goes away.   She notes that she recently chipped a tooth and wants to get this corrected with her dentist. She notes that her broken tooth is not too  bothersome and will let us[place]ow if she elects the procedure as we would need to hold her Xgeva shot for 2 months prior to and after this procedure.   Of note since the patient's last visit, pt has had CT C/A/P completed on 04/26/18 with results revealing 1. Stable exam.  No new or progressive interval findings. 2. Large destructive left iliac lesion is similar to prior. 3. Small hiatal hernia with upper normal adjacent lymph nodes, unchanged. 4.  Emphysema 5.  Aortic Atherosclerois.  Lab results today (05/03/18) of CBC, and Reticulocytes is as follows: all values are WNL except for Hgb at 11.2. CMP 05/03/18 is WNL.  On review of systems, pt reports stable diarrhea, occasional hip pain, intermittent shoulder pain, recent broken tooth, and denies other mouth sores, breathing problems, abdominal pains, leg swelling, and any other symptoms.    MEDICAL HISTORY:  Past Medical History:  Diagnosis Date  . Barrett's esophagus   . Bone neoplasm 06/24/2015  . [facility]   metastatic poorly differentiated carcinoma. tumor left groin surgical removal with radiation tx.  . Cataract    BILATERAL  . Cigarette smoker two packs a day or less    Currently still smoking 2 PPD - Not interested in quitting at this time.  . Colitis 2017  . Colon polyps    hyperplastic, tubular adenomas, tubulovillous adenoma  . Cough, persistent    hx. lung cancer ? primary-being evaluated, unsure of primary site.  . Depression 06/24/2015  . Diverticulosis   . Em[place] of lung ([facility]  . Endometriosis    Hysterectomy with BSO  at age 77 yrs  . Esophageal adenocarcinoma ([place]) 08/11/15   intramucosal  . Gastritis   . GERD (gastroesophageal reflux disease)   . H/O: pneumonia   . Hiatal hernia   . Hyperlipidemia   . Hypertension 06/24/2015   likely improved incidental to 40 lbs weight loss from her neoplasm. No Longer taking med for this as of 08-06-15  . IBS (irritable bowel syndrome)   . Pain    left  hip-persistent"tumor of bone"-radiation tx. 10.  . Vitamin D deficiency disease    SURGICAL HISTORY: Past Surgical History:  Procedure Laterality Date  . BARTHOLIN GLAND CYST EXCISION  77 yo ago   Does not want if it was an infected cyst or tumor. Was soon as delivery  . COLONOSCOPY W/ POLYPECTOMY     multiple times - last done 09/2014 per patient.  . ESOPHAGOGASTRODUODENOSCOPY (EGD) WITH PROPOFOL N/A 08/11/2015   Procedure: ESOPHAGOGASTRODUODENOSCOPY (EGD) WITH PROPOFOL;  Surgeon: [name], MD;  Location: [facility];  Service: Gastroenterology;  Laterality: N/A;  . FLEXIBLE SIGMOIDOSCOPY N/A 06/24/2017   Procedure: FLEXIBLE SIGMOIDOSCOPY;  Surgeon: Manus Gunning, MD;  Location: [facility];  Service: Gastroenterology;  Laterality: N/A;  . GANGLION CYST EXCISION    . KNEE ARTHROSCOPY  age about 33 yrs  . TONSILLECTOMY    . TOTAL ABDOMINAL HYSTERECTOMY W/ BILATERAL SALPINGOOPHORECTOMY  at age 77 yrs   For endometriosis    SOCIAL HISTORY: Social History   Socioeconomic History  . Marital status: Widowed    Spouse name: Not on file  . Number of children: 2  . Years of education: Not on file  . Highest education level: Not on file  Occupational History  . Not on file  Social Needs  . Financial resource strain: Not on file  . Food insecurity:    Worry: Not on file    Inability: Not on file  . Transportation needs:    Medical: Not on file    Non-medical: Not on file  Tobacco Use  . Smoking status: Former Smoker    Packs/day: 1.00    Years: 60.00    Pack years: 60.00    Types: Cigarettes    Last attempt to quit: 12/06/2015    Years since quitting: 2.4  . Smokeless tobacco: Never Used  Substance and Sexual Activity  . Alcohol use: No    Alcohol/week: 0.0 oz  . Drug use: No  . Sexual activity: Never  Lifestyle  . Physical activity:    Days per week: Not on file    Minutes per session: Not on file  . Stress: Not on file  Relationships  . Social  connections:    Talks on phone: Not on file    Gets together: Not on file    Attends religious service: Not on file    Active member of club or organization: Not on file    Attends meetings of clubs or organizations: Not on file    Relationship status: Not on file  . Intimate partner violence:    Fear of current or ex partner: Not on file    Emotionally abused: Not on file    Physically abused: Not on file    Forced sexual activity: Not on file  Other Topics Concern  . Not on file  Social History Narrative  . Not on file    FAMILY HISTORY: Family History  Problem Relation Age of Onset  . Colon cancer Brother   . Colon cancer Brother   .  Stroke Mother   . Colon cancer Father   . Breast cancer Daughter 63       ER/PR+ stage II    ALLERGIES:  is allergic to penicillins; remeron [mirtazapine]; and latex. patient wonders if she has a penicillin allergy but notes that she is uncertain about this.  MEDICATIONS:  Current Outpatient Medications  Medication Sig Dispense Refill  . amLODipine (NORVASC) 10 MG tablet Take 1 tablet (10 mg total) by mouth daily. 90 tablet 2  . calcium-vitamin D (OSCAL WITH D) 500-200 MG-UNIT tablet Take 2 tablets by mouth 3 (three) times daily.    Cindy Byrd lidocaine-prilocaine (EMLA) cream Apply small amount over port 1-2 hours prior to treatment, cover with plastic wrap (DO NOT RUB IN). 30 g prn  . LORazepam (ATIVAN) 1 MG tablet Take 1 tablet (1 mg total) by mouth every 8 (eight) hours as needed for anxiety (or nausea). 30 tablet 0  . omeprazole (PRILOSEC) 40 MG capsule TAKE 1 CAPSULE BY MOUTH EVERY DAY 90 capsule 2  . ondansetron (ZOFRAN) 8 MG tablet Take 1 tablet (8 mg total) by mouth every 8 (eight) hours as needed for nausea or vomiting. 60 tablet 0  . rivaroxaban (XARELTO) 20 MG TABS tablet Take 1 tablet (20 mg total) by mouth daily with supper. Discontinue lovenox and start Xarelto at the scheduled time as instructed. 30 tablet 3  . saccharomyces boulardii  (FLORASTOR) 250 MG capsule Take 1 capsule (250 mg total) by mouth 2 (two) times daily. 60 capsule 0  . traZODone (DESYREL) 50 MG tablet Take 1 tablet (50 mg total) by mouth at bedtime. 30 tablet 3  . Vitamin D, Ergocalciferol, (DRISDOL) 50000 units CAPS capsule Take 50,000 Units by mouth every 7 (seven) days.     No current facility-administered medications for this visit.     REVIEW OF SYSTEMS:    A 10+ POINT REVIEW OF SYSTEMS WAS OBTAINED including neurology, dermatology, psychiatry, cardiac, respiratory, lymph, extremities, GI, GU, Musculoskeletal, constitutional, breasts, reproductive, HEENT.  All pertinent positives are noted in the HPI.  All others are negative.   PHYSICAL EXAMINATION: ECOG PERFORMANCE STATUS: 2 - Symptomatic, <50% confined to bed  Vitals:   05/03/18 1208  BP: (!) 145/68  Pulse: 65  Resp: 18  Temp: 98.4 F (36.9 C)  SpO2: 100%   Filed Weights   05/03/18 1208  Weight: 147 lb 3.2 oz (66.8 kg)  . Cindy Byrd Wt Readings from Last 3 Encounters:  05/03/18 147 lb 3.2 oz (66.8 kg)  04/23/18 150 lb 3.2 oz (68.1 kg)  03/08/18 145 lb 6.4 oz (66 kg)   GENERAL:alert, in no acute distress and comfortable SKIN: no acute rashes, no significant lesions EYES: conjunctiva are pink and non-injected, sclera anicteric OROPHARYNX: MMM, no exudates, no oropharyngeal erythema or ulceration NECK: supple, no JVD LYMPH:  no palpable lymphadenopathy in the cervical, axillary or inguinal regions LUNGS: clear to auscultation b/l with normal respiratory effort HEART: regular rate & rhythm ABDOMEN:  normoactive bowel sounds , non tender, not distended. Extremity: no pedal edema PSYCH: alert & oriented x 3 with fluent speech NEURO: no focal motor/sensory deficits   LABORATORY DATA:  I have reviewed the data as listed  . CBC Latest Ref Rng & Units 05/03/2018 04/16/2018 04/05/2018  WBC 3.9 - 10.3 K/uL 7.6 6.6 7.2  Hemoglobin 11.6 - 15.9 g/dL 11.2(L) 11.6 11.7  Hematocrit 34.8 - 46.6 % 35.2  37.3 36.5  Platelets 145 - 400 K/uL 226 225 202    .  CMP Latest Ref Rng & Units 05/03/2018 04/16/2018 04/05/2018  Glucose 70 - 140 mg/dL 112 102(H) 108  BUN 7 - 26 mg/dL _0 Creatinine 0.60 - 1.10 mg/dL 1.10 1.00 1.27(H)  Sodium 136 - 145 mmol/L 137 140 138  Potassium 3.5 - 5.1 mmol/L 4.4 4.3 4.2  Chloride 98 - 109 mmol/L 103 104 110(H)  CO2 22 - 29 mmol/L 27 24 21(L)  Calcium 8.4 - 10.4 mg/dL 9.4 8.9 8.9  Total Protein 6.4 - 8.3 g/dL 8.1 7.1 8.1  Total Bilirubin 0.2 - 1.2 mg/dL 0.3 0.2 0.3  Alkaline Phos 40 - 150 U/L 62 65 60  AST 5 - 34 U/L _1 ALT 0 - 55 U/L _2 RADIOGRAPHIC STUDIES:  .Ct Chest W Contrast  Result Date: 04/26/2018 CLINICAL DATA:  Right lung cancer and esophageal cancer. Bone metastases. EXAM: CT CHEST, ABDOMEN, AND PELVIS WITH CONTRAST TECHNIQUE: Multidetector CT imaging of the chest, abdomen and pelvis was performed following the standard protocol during bolus administration of intravenous contrast. CONTRAST:  116m ISOVUE-300 IOPAMIDOL (ISOVUE-300) INJECTION 61% COMPARISON:  01/04/2018 FINDINGS: CT CHEST FINDINGS Cardiovascular: The heart size is normal. No pericardial effusion. Coronary artery calcification is evident. Atherosclerotic calcification is noted in the wall of the thoracic aorta. Right Port-A-Cath tip is positioned in the distal SVC. Mediastinum/Nodes: No mediastinal lymphadenopathy. There is no hilar lymphadenopathy. Small hiatal hernia. The esophagus has normal imaging features. There is no axillary lymphadenopathy. Lungs/Pleura: The central tracheobronchial airways are patent. Centrilobular emphysema noted. 4 mm right lower lobe pulmonary nodule (image 91/series 4) is stable. Linear scarring posterior left lower lobe is similar to prior. No focal airspace consolidation. No pulmonary edema pleural effusion. Musculoskeletal: Multiple old left-sided rib fractures evident. CT ABDOMEN PELVIS FINDINGS Hepatobiliary: Multiple well-defined  low-density lesions in the liver are stable and remain compatible with cysts. There is no evidence for gallstones, gallbladder wall thickening, or pericholecystic fluid. No intrahepatic or extrahepatic biliary dilation. Pancreas: No focal mass lesion. No dilatation of the main duct. No intraparenchymal cyst. No peripancreatic edema. Spleen: No splenomegaly. No focal mass lesion. Adrenals/Urinary Tract: No adrenal nodule or mass. Kidneys unremarkable. No evidence for hydroureter. The urinary bladder appears normal for the degree of distention. Stomach/Bowel: Small hiatal hernia noted. 8 mm short axis lymph node adjacent to the hiatal hernia is stable. Duodenum is normally positioned as is the ligament of Treitz. Duodenal diverticulum evident. No small bowel wall thickening. No small bowel dilatation. The terminal ileum is normal. The appendix is normal. Diverticular changes are noted in the left colon without evidence of diverticulitis. Vascular/Lymphatic: There is abdominal aortic atherosclerosis without aneurysm. There is no gastrohepatic or hepatoduodenal ligament lymphadenopathy. No intraperitoneal or retroperitoneal lymphadenopathy. No pelvic sidewall lymphadenopathy. Reproductive: Uterus surgically absent.  There is no adnexal mass. Other: No intraperitoneal free fluid. Musculoskeletal: Similar appearance of the large destructive left iliac lesion with associated pathologic fracture IMPRESSION: 1. Stable exam.  No new or progressive interval findings. 2. Large destructive left iliac lesion is similar to prior. 3. Small hiatal hernia with upper normal adjacent lymph nodes, unchanged. 4.  Emphysema. (ICD10-J43.9) 5.  Aortic Atherosclerois (ICD10-170.0) Electronically Signed   By: EMisty StanleyM.D.   On: 04/26/2018 16:02   Ct Abdomen Pelvis W Contrast  Result Date: 04/26/2018 CLINICAL DATA:  Right lung cancer and esophageal cancer. Bone metastases. EXAM: CT CHEST, ABDOMEN, AND PELVIS WITH CONTRAST TECHNIQUE:  Multidetector CT imaging of the chest, abdomen  and pelvis was performed following the standard protocol during bolus administration of intravenous contrast. CONTRAST:  179m ISOVUE-300 IOPAMIDOL (ISOVUE-300) INJECTION 61% COMPARISON:  01/04/2018 FINDINGS: CT CHEST FINDINGS Cardiovascular: The heart size is normal. No pericardial effusion. Coronary artery calcification is evident. Atherosclerotic calcification is noted in the wall of the thoracic aorta. Right Port-A-Cath tip is positioned in the distal SVC. Mediastinum/Nodes: No mediastinal lymphadenopathy. There is no hilar lymphadenopathy. Small hiatal hernia. The esophagus has normal imaging features. There is no axillary lymphadenopathy. Lungs/Pleura: The central tracheobronchial airways are patent. Centrilobular emphysema noted. 4 mm right lower lobe pulmonary nodule (image 91/series 4) is stable. Linear scarring posterior left lower lobe is similar to prior. No focal airspace consolidation. No pulmonary edema pleural effusion. Musculoskeletal: Multiple old left-sided rib fractures evident. CT ABDOMEN PELVIS FINDINGS Hepatobiliary: Multiple well-defined low-density lesions in the liver are stable and remain compatible with cysts. There is no evidence for gallstones, gallbladder wall thickening, or pericholecystic fluid. No intrahepatic or extrahepatic biliary dilation. Pancreas: No focal mass lesion. No dilatation of the main duct. No intraparenchymal cyst. No peripancreatic edema. Spleen: No splenomegaly. No focal mass lesion. Adrenals/Urinary Tract: No adrenal nodule or mass. Kidneys unremarkable. No evidence for hydroureter. The urinary bladder appears normal for the degree of distention. Stomach/Bowel: Small hiatal hernia noted. 8 mm short axis lymph node adjacent to the hiatal hernia is stable. Duodenum is normally positioned as is the ligament of Treitz. Duodenal diverticulum evident. No small bowel wall thickening. No small bowel dilatation. The terminal  ileum is normal. The appendix is normal. Diverticular changes are noted in the left colon without evidence of diverticulitis. Vascular/Lymphatic: There is abdominal aortic atherosclerosis without aneurysm. There is no gastrohepatic or hepatoduodenal ligament lymphadenopathy. No intraperitoneal or retroperitoneal lymphadenopathy. No pelvic sidewall lymphadenopathy. Reproductive: Uterus surgically absent.  There is no adnexal mass. Other: No intraperitoneal free fluid. Musculoskeletal: Similar appearance of the large destructive left iliac lesion with associated pathologic fracture IMPRESSION: 1. Stable exam.  No new or progressive interval findings. 2. Large destructive left iliac lesion is similar to prior. 3. Small hiatal hernia with upper normal adjacent lymph nodes, unchanged. 4.  Emphysema. (ICD10-J43.9) 5.  Aortic Atherosclerois (ICD10-170.0) Electronically Signed   By: EMisty StanleyM.D.   On: 04/26/2018 16:02    ASSESSMENT & PLAN:   #1 Metastatic poorly differentiated carcinoma with likely lung primary [non-small cell lung cancer.  CT of the head with and without contrast showed no evidence of metastatic disease. Patient notes much improved pain control. EGFR blood test mutation analysis negative. Patient's pain is much better controlled after radiation for the painful ilium met. CT chest abdomen pelvis 04/19/2016 shows no evidence of disease progression. Patient tolerated Nivolumab very well but was discontinued when she developed grade 2 Immune colitis. Has been off Nivolumab for >6 months  CT chest abdomen pelvis on 06/24/2016 shows no evidence of new disease or progression of metastatic disease. CT chest abdomen pelvis 09/06/2016 shows 1. Mixed interval response to therapy. 2. There is a new left ventral chest wall lesion deep to the pectoralis musculature worrisome for metastatic disease. 3. Posterior lower lobe nodular densities are identified which may reflect areas of pulmonary metastasis.  4. Interval decrease in size of destructive lesion involving the left iliac bone.  CT chest abd pelvis 12/08/2016: Cystic mass involving the left ventral chest wall has resolved in the interval. Likely was a hematoma due to trauma. Interval increase in size of pleural base mass overlying the posterior and inferior  left lower lobe. There is also a new left pleural effusion identified.  CT chest 02/01/2017: Residual irregular soft tissue thickening/volume loss and trace left pleural fluid at the base of the left hemithorax, overall improved in appearance from 12/08/2016. No measurable lesion.  CT chest 05/29/2017 shows no residual pleural based mass or significant pleural effusion in the left hemithorax. No evidence of thoracic metastatic disease. No evidence of progressive metastatic disease within the abdomen or pelvis. Mixed lytic and blastic lesion involving the left iliac bone and associated pathologic fracture are unchanged.   CT CAP 09/14/17 shows no new changes. She does have slight displacement of her fractured left iliac bone. Evidence of stable disease.   CT CAP 01/04/2018- No new or progressive metastatic disease. Stable large left iliac bone metastasis with associated chronic pathologic fracture.  PLAN -Discussed pt labwork today, 05/03/18; blood counts are stable  -Reviewed CT chest/abd/pelvis done on 04/26/18 which revealed Stable exam.  No new or progressive interval findings. -Discussed that we will hold Xgeva for 2 months before and after a an impending dental procedure -Continue to limit salt intake with food and begin using sports compression socks for occasional ankle swelling -Will see pt back in 2 months -The pt shows no clinical, radiographic, or lab progression of lung cancer at this time.  -We will have her port flushed today  -Trazodone for patient's sleep difficulties -2000 units Vitamin D daily -- adjust dose to maintain 25OH vit D levels of 40-60   #2 diarrhea-  now  resolved was previously. S/p grade 2 likely related to immune colitis from her Nivolumab and also had c diff colitis (s/p vancomycin) and possible underlying IBD She was previously on on Lialda, budesonide,probiotics and lomotil but not currently taking any of these. Plan -Continue Sandostatin monthly  #3 h/o diverticulitis and c dff colitis - now resolved Plan  -continue on sandostatin today and q4weeks.  -continue on Lialda  #4 DVT and PE  -continue on Xarelto - no issues with bleeding  #5 Barrett's esophagus 4cms in the distal esophagus with low and high-grade dysplasia cannot rule out an early intra-mucosal esophageal adenocarcinoma. Plan -Patient being monitored by Dr. [name] from GI. -patient notes that she does not want to f/u at [facility] for further intervention as per GI plan.   Continue Sandostatin q4 weeks Discontinue Xgeva at this time for dental procedure  RTC with Dr Irene Limbo in 2 months with labs   All questions were answered. The patient knows to call the clinic with any problems, questions or concerns.  . The total time spent in the appointment was 25 minutes and more than 50% was on counseling and direct patient cares.    Sullivan Lone MD MS Hematology/Oncology Physician [facility]  (Office): [phone] (Work cell): [phone] (Fax): [phone]  I, Baldwin [place], am acting as a scribe for Dr Irene Limbo.   .I have reviewed the above documentation for accuracy and completeness, and I agree with the above. [name] MD MS

## 2018-05-03 ENCOUNTER — Inpatient Hospital Stay: Payer: Medicare Other

## 2018-05-03 ENCOUNTER — Encounter: Payer: Self-pay | Admitting: Hematology

## 2018-05-03 ENCOUNTER — Inpatient Hospital Stay (HOSPITAL_BASED_OUTPATIENT_CLINIC_OR_DEPARTMENT_OTHER): Payer: Medicare Other | Admitting: Hematology

## 2018-05-03 VITALS — BP 145/68 | HR 65 | Temp 98.4°F | Resp 18 | Ht 69.0 in | Wt 147.2 lb

## 2018-05-03 DIAGNOSIS — I2699 Other pulmonary embolism without acute cor pulmonale: Secondary | ICD-10-CM

## 2018-05-03 DIAGNOSIS — C3490 Malignant neoplasm of unspecified part of unspecified bronchus or lung: Secondary | ICD-10-CM

## 2018-05-03 DIAGNOSIS — I82409 Acute embolism and thrombosis of unspecified deep veins of unspecified lower extremity: Secondary | ICD-10-CM | POA: Diagnosis not present

## 2018-05-03 DIAGNOSIS — C7951 Secondary malignant neoplasm of bone: Secondary | ICD-10-CM

## 2018-05-03 DIAGNOSIS — R197 Diarrhea, unspecified: Secondary | ICD-10-CM | POA: Diagnosis not present

## 2018-05-03 DIAGNOSIS — C3491 Malignant neoplasm of unspecified part of right bronchus or lung: Secondary | ICD-10-CM

## 2018-05-03 DIAGNOSIS — Z87891 Personal history of nicotine dependence: Secondary | ICD-10-CM

## 2018-05-03 DIAGNOSIS — Z95828 Presence of other vascular implants and grafts: Secondary | ICD-10-CM

## 2018-05-03 LAB — CMP (CANCER CENTER ONLY)
ALK PHOS: 62 U/L (ref 40–150)
ALT: 11 U/L (ref 0–55)
AST: 24 U/L (ref 5–34)
Albumin: 3.5 g/dL (ref 3.5–5.0)
Anion gap: 7 (ref 3–11)
BUN: 16 mg/dL (ref 7–26)
CALCIUM: 9.4 mg/dL (ref 8.4–10.4)
CHLORIDE: 103 mmol/L (ref 98–109)
CO2: 27 mmol/L (ref 22–29)
CREATININE: 1.1 mg/dL (ref 0.60–1.10)
GFR, EST AFRICAN AMERICAN: 54 mL/min — AB (ref >60)
GFR, Estimated: 47 mL/min — ABNORMAL LOW (ref >60)
Glucose, Bld: 112 mg/dL (ref 70–140)
Potassium: 4.4 mmol/L (ref 3.5–5.1)
Sodium: 137 mmol/L (ref 136–145)
Total Bilirubin: 0.3 mg/dL (ref 0.2–1.2)
Total Protein: 8.1 g/dL (ref 6.4–8.3)

## 2018-05-03 LAB — RETICULOCYTES
RBC.: 3.93 MIL/uL (ref 3.70–5.45)
RETIC CT PCT: 1.2 % (ref 0.7–2.1)
Retic Count, Absolute: 47.2 10*3/uL (ref 33.7–90.7)

## 2018-05-03 LAB — CBC WITH DIFFERENTIAL (CANCER CENTER ONLY)
BASOS PCT: 1 %
Basophils Absolute: 0.1 10*3/uL (ref 0.0–0.1)
EOS ABS: 0.2 10*3/uL (ref 0.0–0.5)
EOS PCT: 3 %
HCT: 35.2 % (ref 34.8–46.6)
HEMOGLOBIN: 11.2 g/dL — AB (ref 11.6–15.9)
LYMPHS ABS: 1.9 10*3/uL (ref 0.9–3.3)
Lymphocytes Relative: 26 %
MCH: 28.5 pg (ref 25.1–34.0)
MCHC: 31.8 g/dL (ref 31.5–36.0)
MCV: 89.6 fL (ref 79.5–101.0)
Monocytes Absolute: 0.6 10*3/uL (ref 0.1–0.9)
Monocytes Relative: 7 %
NEUTROS PCT: 63 %
Neutro Abs: 4.8 10*3/uL (ref 1.5–6.5)
PLATELETS: 226 10*3/uL (ref 145–400)
RBC: 3.93 MIL/uL (ref 3.70–5.45)
RDW: 13.6 % (ref 11.2–14.5)
WBC Count: 7.6 10*3/uL (ref 3.9–10.3)

## 2018-05-03 MED ORDER — SODIUM CHLORIDE 0.9% FLUSH
10.0000 mL | Freq: Once | INTRAVENOUS | Status: AC
  Administered 2018-05-03: 10 mL
  Filled 2018-05-03: qty 10

## 2018-05-03 MED ORDER — OCTREOTIDE ACETATE 30 MG IM KIT
30.0000 mg | PACK | Freq: Once | INTRAMUSCULAR | Status: AC
  Administered 2018-05-03: 30 mg via INTRAMUSCULAR

## 2018-05-03 MED ORDER — HEPARIN SOD (PORK) LOCK FLUSH 100 UNIT/ML IV SOLN
500.0000 [IU] | Freq: Once | INTRAVENOUS | Status: AC
  Administered 2018-05-03: 500 [IU]
  Filled 2018-05-03: qty 5

## 2018-05-03 MED ORDER — DENOSUMAB 120 MG/1.7ML ~~LOC~~ SOLN
SUBCUTANEOUS | Status: AC
  Filled 2018-05-03: qty 1.7

## 2018-05-03 MED ORDER — OCTREOTIDE ACETATE 30 MG IM KIT
PACK | INTRAMUSCULAR | Status: AC
  Filled 2018-05-03: qty 1

## 2018-05-31 ENCOUNTER — Inpatient Hospital Stay: Payer: Medicare Other

## 2018-05-31 ENCOUNTER — Inpatient Hospital Stay: Payer: Medicare Other | Attending: Hematology

## 2018-05-31 DIAGNOSIS — C3490 Malignant neoplasm of unspecified part of unspecified bronchus or lung: Secondary | ICD-10-CM

## 2018-05-31 DIAGNOSIS — Z95828 Presence of other vascular implants and grafts: Secondary | ICD-10-CM

## 2018-05-31 DIAGNOSIS — C3491 Malignant neoplasm of unspecified part of right bronchus or lung: Secondary | ICD-10-CM | POA: Insufficient documentation

## 2018-05-31 DIAGNOSIS — C7951 Secondary malignant neoplasm of bone: Secondary | ICD-10-CM | POA: Insufficient documentation

## 2018-05-31 LAB — CBC WITH DIFFERENTIAL (CANCER CENTER ONLY)
BASOS PCT: 1 %
Basophils Absolute: 0.1 10*3/uL (ref 0.0–0.1)
EOS ABS: 0.2 10*3/uL (ref 0.0–0.5)
Eosinophils Relative: 3 %
HCT: 35 % (ref 34.8–46.6)
HEMOGLOBIN: 11.1 g/dL — AB (ref 11.6–15.9)
Lymphocytes Relative: 28 %
Lymphs Abs: 2.1 10*3/uL (ref 0.9–3.3)
MCH: 28.6 pg (ref 25.1–34.0)
MCHC: 31.7 g/dL (ref 31.5–36.0)
MCV: 90.2 fL (ref 79.5–101.0)
MONO ABS: 0.6 10*3/uL (ref 0.1–0.9)
MONOS PCT: 8 %
NEUTROS PCT: 60 %
Neutro Abs: 4.6 10*3/uL (ref 1.5–6.5)
Platelet Count: 215 10*3/uL (ref 145–400)
RBC: 3.88 MIL/uL (ref 3.70–5.45)
RDW: 13.9 % (ref 11.2–14.5)
WBC Count: 7.7 10*3/uL (ref 3.9–10.3)

## 2018-05-31 LAB — CMP (CANCER CENTER ONLY)
ALBUMIN: 3.4 g/dL — AB (ref 3.5–5.0)
ALK PHOS: 63 U/L (ref 38–126)
ALT: 12 U/L (ref 0–44)
AST: 18 U/L (ref 15–41)
Anion gap: 6 (ref 5–15)
BILIRUBIN TOTAL: 0.3 mg/dL (ref 0.3–1.2)
BUN: 21 mg/dL (ref 8–23)
CO2: 29 mmol/L (ref 22–32)
Calcium: 9.8 mg/dL (ref 8.9–10.3)
Chloride: 104 mmol/L (ref 98–111)
Creatinine: 1.13 mg/dL — ABNORMAL HIGH (ref 0.44–1.00)
GFR, EST NON AFRICAN AMERICAN: 45 mL/min — AB (ref >60)
GFR, Est AFR Am: 53 mL/min — ABNORMAL LOW (ref >60)
GLUCOSE: 108 mg/dL — AB (ref 70–99)
POTASSIUM: 4.5 mmol/L (ref 3.5–5.1)
Sodium: 139 mmol/L (ref 135–145)
TOTAL PROTEIN: 7.8 g/dL (ref 6.5–8.1)

## 2018-05-31 LAB — RETICULOCYTES
RBC.: 3.88 MIL/uL (ref 3.70–5.45)
RETIC CT PCT: 1.4 % (ref 0.7–2.1)
Retic Count, Absolute: 54.3 10*3/uL (ref 33.7–90.7)

## 2018-05-31 MED ORDER — OCTREOTIDE ACETATE 30 MG IM KIT
30.0000 mg | PACK | Freq: Once | INTRAMUSCULAR | Status: AC
  Administered 2018-05-31: 30 mg via INTRAMUSCULAR

## 2018-05-31 MED ORDER — OCTREOTIDE ACETATE 30 MG IM KIT
PACK | INTRAMUSCULAR | Status: AC
  Filled 2018-05-31: qty 1

## 2018-05-31 NOTE — Patient Instructions (Signed)

## 2018-06-01 ENCOUNTER — Telehealth: Payer: Self-pay

## 2018-06-01 NOTE — Telephone Encounter (Signed)
Clearance letter for dental extractions faxed to [employer], DDS at [phone]. Confirmation of fax received.

## 2018-06-19 ENCOUNTER — Telehealth: Payer: Self-pay | Admitting: Medical Oncology

## 2018-06-19 NOTE — Telephone Encounter (Signed)
Pt asking about letter.Pt notified dental clearance letter was faxed 6/28. Pt said she is scheduled for august 23 with Dr Morrie Sheldon

## 2018-06-27 NOTE — Progress Notes (Signed)
[name]    HEMATOLOGY ONCOLOGY PROGRESS NOTE  Date of service:  06/28/18     Patient Care Team: [name], NP as PCP - General (Family Medicine) [name], MD as Consulting Physician (Hematology and Oncology)  CHIEF COMPLAINTS/PURPOSE OF CONSULTATION:  F/u for metastatic lung cancer  DIAGNOSIS:   #1 Metastatic non-small cell lung cancer with bilateral lung nodules and large metastatic lesion in the left Ilium. #2 Barrett's esophagus with some evidence of intramucosal adenocarcinoma of the esophagus. 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 Also had c diff colitis - treated   Current Treatment  1) Active surveillance 2) Xgeva 121m [place] q4weeks for bone metastases. 3) Sandostatin q4weeks for diarrhea - immune colitis  Previous Treatment  1 Palliative radiation therapy to the large left ilium metastases 2. IV Nivolumab x 20ycles (discontinued due to likely immune colitis) 3. Xgeva 1245mSC q4weeks for bone metastases.   HISTORY OF PRESENTING ILLNESS: (plz see my previous consultation for details of initial presentation)  INTERVAL HISTORY:   Cindy ToMesmers here for her scheduled follow-up for metastatic lung cancer. The patient's last visit with [place] on 05/03/18. The pt reports that she is doing well overall.   The pt reports that she will be having her dental procedure next Monday 07/02/18. She is hoping to have surgery for cataracts, and understands she would need to hold Xarelto at that time.   She notes that she has had some slowly increasing SOB, which she describes as intermittent and notices while talking on the phone or bending over. She also notes that she is spitting up some clear phlegm and denies fevers, chills, post-nasal drip, and chest pain. She denies seasonal allergies or using an inhaler in the past.   She notes that her bowel movements have been stable but continue  to be a little loose.   Lab results today (06/28/18) of CBC w/diff, CMP, and Reticulocytes is as follows: all values are WNL except for HGB at 10.6, HCT at 33.4, Glucose at 111, Creatinine at 1.20, GFR at 42. Vitamin D 06/28/18 is 32.6  On review of systems, pt reports slowly increasing frequency SOB, clear phlegm, staying active, some lower leg swelling, loose stools, and denies rib pain, chest pain, blood in the stools, fevers, chills, night sweats, unexpected weight loss, wheezing, and any other symptoms.    MEDICAL HISTORY:  Past Medical History:  Diagnosis Date  . Barrett's esophagus   . Bone neoplasm 06/24/2015  . [facility] [place]   metastatic poorly differentiated carcinoma. tumor left groin surgical removal with radiation tx.  . Cataract    BILATERAL  . Cigarette smoker two packs a day or less    Currently still smoking 2 PPD - Not interested in quitting at this time.  . Colitis 2017  . Colon polyps    hyperplastic, tubular adenomas, tubulovillous adenoma  . Cough, persistent    hx. lung cancer ? primary-being evaluated, unsure of primary site.  . Depression 06/24/2015  . Diverticulosis   . [place] lung [place]  . Endometriosis    Hysterectomy with BSO at age [age]rs  . [place]ophageal [place]   intramucosal  . Gastritis   . GERD (gastroesophageal reflux disease)   . H/O: pneumonia   . Hiatal hernia   . Hyperlipidemia   . Hypertension 06/24/2015   likely improved incidental to 40 lbs weight loss from her  neoplasm. No Longer taking med for this as of 08-06-15  . IBS (irritable bowel syndrome)   . Pain    left hip-persistent"tumor of bone"-radiation tx. 10.  . Vitamin D deficiency disease    SURGICAL HISTORY: Past Surgical History:  Procedure Laterality Date  . BARTHOLIN GLAND CYST EXCISION  77 yo ago   Does not want if it was an infected cyst or tumor. Was soon as delivery  . COLONOSCOPY W/ POLYPECTOMY     multiple times - last done 09/2014 per patient.   . ESOPHAGOGASTRODUODENOSCOPY (EGD) WITH PROPOFOL N/A 08/11/2015   Procedure: ESOPHAGOGASTRODUODENOSCOPY (EGD) WITH PROPOFOL;  Surgeon: [name], MD;  Location: [facility];  Service: Gastroenterology;  Laterality: N/A;  . FLEXIBLE SIGMOIDOSCOPY N/A 06/24/2017   Procedure: FLEXIBLE SIGMOIDOSCOPY;  Surgeon: Manus Gunning, MD;  Location: [facility];  Service: Gastroenterology;  Laterality: N/A;  . GANGLION CYST EXCISION    . KNEE ARTHROSCOPY  age about 51 yrs  . TONSILLECTOMY    . TOTAL ABDOMINAL HYSTERECTOMY W/ BILATERAL SALPINGOOPHORECTOMY  at age 44 yrs   For endometriosis    SOCIAL HISTORY: Social History   Socioeconomic History  . Marital status: Widowed    Spouse name: Not on file  . Number of children: 2  . Years of education: Not on file  . Highest education level: Not on file  Occupational History  . Not on file  Social Needs  . Financial resource strain: Not on file  . Food insecurity:    Worry: Not on file    Inability: Not on file  . Transportation needs:    Medical: Not on file    Non-medical: Not on file  Tobacco Use  . Smoking status: Former Smoker    Packs/day: 1.00    Years: 60.00    Pack years: 60.00    Types: Cigarettes    Last attempt to quit: 12/06/2015    Years since quitting: 2.5  . Smokeless tobacco: Never Used  Substance and Sexual Activity  . Alcohol use: No    Alcohol/week: 0.0 oz  . Drug use: No  . Sexual activity: Never  Lifestyle  . Physical activity:    Days per week: Not on file    Minutes per session: Not on file  . Stress: Not on file  Relationships  . Social connections:    Talks on phone: Not on file    Gets together: Not on file    Attends religious service: Not on file    Active member of club or organization: Not on file    Attends meetings of clubs or organizations: Not on file    Relationship status: Not on file  . Intimate partner violence:    Fear of current or ex partner: Not on file    Emotionally  abused: Not on file    Physically abused: Not on file    Forced sexual activity: Not on file  Other Topics Concern  . Not on file  Social History Narrative  . Not on file    FAMILY HISTORY: Family History  Problem Relation Age of Onset  . Colon cancer Brother   . Colon cancer Brother   . Stroke Mother   . Colon cancer Father   . Breast cancer Daughter 62       ER/PR+ stage II    ALLERGIES:  is allergic to penicillins; remeron [mirtazapine]; and latex. patient wonders if she has a penicillin allergy but notes that she is uncertain about  this.  MEDICATIONS:  Current Outpatient Medications  Medication Sig Dispense Refill  . amLODipine (NORVASC) 10 MG tablet Take 1 tablet (10 mg total) by mouth daily. 90 tablet 2  . calcium-vitamin D (OSCAL WITH D) 500-200 MG-UNIT tablet Take 2 tablets by mouth 3 (three) times daily.    [name] lidocaine-prilocaine (EMLA) cream Apply small amount over port 1-2 hours prior to treatment, cover with plastic wrap (DO NOT RUB IN). 30 g prn  . LORazepam (ATIVAN) 1 MG tablet Take 1 tablet (1 mg total) by mouth every 8 (eight) hours as needed for anxiety (or nausea). 30 tablet 0  . omeprazole (PRILOSEC) 40 MG capsule TAKE 1 CAPSULE BY MOUTH EVERY DAY 90 capsule 2  . ondansetron (ZOFRAN) 8 MG tablet Take 1 tablet (8 mg total) by mouth every 8 (eight) hours as needed for nausea or vomiting. 60 tablet 0  . rivaroxaban (XARELTO) 20 MG TABS tablet Take 1 tablet (20 mg total) by mouth daily with supper. Discontinue lovenox and start Xarelto at the scheduled time as instructed. 30 tablet 3  . saccharomyces boulardii (FLORASTOR) 250 MG capsule Take 1 capsule (250 mg total) by mouth 2 (two) times daily. 60 capsule 0  . traZODone (DESYREL) 50 MG tablet Take 1 tablet (50 mg total) by mouth at bedtime. 30 tablet 3  . Vitamin D, Ergocalciferol, (DRISDOL) 50000 units CAPS capsule Take 50,000 Units by mouth every 7 (seven) days.     No current facility-administered medications  for this visit.     REVIEW OF SYSTEMS:    A 10+ POINT REVIEW OF SYSTEMS WAS OBTAINED including neurology, dermatology, psychiatry, cardiac, respiratory, lymph, extremities, GI, GU, Musculoskeletal, constitutional, breasts, reproductive, HEENT.  All pertinent positives are noted in the HPI.  All others are negative.   PHYSICAL EXAMINATION: ECOG PERFORMANCE STATUS: 2 - Symptomatic, <50% confined to bed  Vitals:   06/28/18 1159  BP: 131/70  Pulse: 64  Resp: 18  Temp: 98 F (36.7 C)  SpO2: 98%   Filed Weights   06/28/18 1159  Weight: 153 lb (69.4 kg)  . [name] Wt Readings from Last 3 Encounters:  06/28/18 153 lb (69.4 kg)  05/03/18 147 lb 3.2 oz (66.8 kg)  04/23/18 150 lb 3.2 oz (68.1 kg)   GENERAL:alert, in no acute distress and comfortable SKIN: no acute rashes, no significant lesions EYES: conjunctiva are pink and non-injected, sclera anicteric OROPHARYNX: MMM, no exudates, no oropharyngeal erythema or ulceration NECK: supple, no JVD LYMPH:  no palpable lymphadenopathy in the cervical, axillary or inguinal regions LUNGS: clear to auscultation b/l with normal respiratory effort HEART: regular rate & rhythm ABDOMEN:  normoactive bowel sounds , non tender, not distended. No palpable hepatosplenomegaly.  Extremity: no pedal edema PSYCH: alert & oriented x 3 with fluent speech NEURO: no focal motor/sensory deficits   LABORATORY DATA:  I have reviewed the data as listed  . CBC Latest Ref Rng & Units 06/28/2018 05/31/2018 05/03/2018  WBC 3.9 - 10.3 K/uL 6.7 7.7 7.6  Hemoglobin 11.6 - 15.9 g/dL 10.6(L) 11.1(L) 11.2(L)  Hematocrit 34.8 - 46.6 % 33.4(L) 35.0 35.2  Platelets 145 - 400 K/uL 207 215 226    . CMP Latest Ref Rng & Units 06/28/2018 05/31/2018 05/03/2018  Glucose 70 - 99 mg/dL 111(H) 108(H) 112  BUN 8 - 23 mg/dL 17 21 16   Creatinine 0.44 - 1.00 mg/dL 1.20(H) 1.13(H) 1.10  Sodium 135 - 145 mmol/L 139 139 137  Potassium 3.5 - 5.1 mmol/L 3.9 4.5 4.4  Chloride 98 - 111  mmol/L 108 104 103  CO2 22 - 32 mmol/L 25 29 27   Calcium 8.9 - 10.3 mg/dL 9.6 9.8 9.4  Total Protein 6.5 - 8.1 g/dL 7.7 7.8 8.1  Total Bilirubin 0.3 - 1.2 mg/dL 0.3 0.3 0.3  Alkaline Phos 38 - 126 U/L 65 63 62  AST 15 - 41 U/L 28 18 24   ALT 0 - 44 U/L 19 12 11       RADIOGRAPHIC STUDIES:  .No results found.  ASSESSMENT & PLAN:   77 y.o. female with  #1 Metastatic poorly differentiated carcinoma with likely lung primary non-small cell lung cancer.  CT of the head with and without contrast showed no evidence of metastatic disease. Patient notes much improved pain control. EGFR blood test mutation analysis negative. Patient's pain is much better controlled after radiation for the painful ilium met. CT chest abdomen pelvis 04/19/2016 shows no evidence of disease progression. Patient tolerated Nivolumab very well but was discontinued when she developed grade 2 Immune colitis. Has been off Nivolumab for >6 months  CT chest abdomen pelvis on 06/24/2016 shows no evidence of new disease or progression of metastatic disease. CT chest abdomen pelvis 09/06/2016 shows 1. Mixed interval response to therapy. 2. There is a new left ventral chest wall lesion deep to the pectoralis musculature worrisome for metastatic disease. 3. Posterior lower lobe nodular densities are identified which may reflect areas of pulmonary metastasis. 4. Interval decrease in size of destructive lesion involving the left iliac bone.  CT chest abd pelvis 12/08/2016: Cystic mass involving the left ventral chest wall has resolved in the interval. Likely was a hematoma due to trauma. Interval increase in size of pleural base mass overlying the posterior and inferior left lower lobe. There is also a new left pleural effusion identified.  CT chest 02/01/2017: Residual irregular soft tissue thickening/volume loss and trace left pleural fluid at the base of the left hemithorax, overall improved in appearance from 12/08/2016. No  measurable lesion.  CT chest 05/29/2017 shows no residual pleural based mass or significant pleural effusion in the left hemithorax. No evidence of thoracic metastatic disease. No evidence of progressive metastatic disease within the abdomen or pelvis. Mixed lytic and blastic lesion involving the left iliac bone and associated pathologic fracture are unchanged.   CT CAP 09/14/17 shows no new changes. She does have slight displacement of her fractured left iliac bone. Evidence of stable disease.   CT CAP 01/04/2018- No new or progressive metastatic disease. Stable large left iliac bone metastasis with associated chronic pathologic fracture.   CT chest/abd/pelvis done on 04/26/18 revealed Stable exam.  No new or progressive interval findings.  PLAN: Discussed pt labwork today, 06/28/18; HGB slightly decreased to 10.6 -Will continue to hold Xgeva until 2 months after impending dental procedure -Will collect CXR in light of increased SOB-- CXR --NAI -Discussed that she is clear from my perspective to receive cataracts surgery, if she holds her Lovenox.  -Will collect CT C/A/P in 3 weeks and will see pt back in 4 weeks   -Continue to limit salt intake with food and begin using sports compression socks for occasional ankle swelling -Trazodone for patient's sleep difficulties -2000 units Vitamin D daily -- adjust dose to maintain 25OH vit D levels of 40-60 -  #2 diarrhea-  now resolved was previously. 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 She  was previously on on Lialda, budesonide,probiotics and lomotil but not currently taking any of these. Plan -Continue Sandostatin every 4 weeks  #3 h/o diverticulitis and c dff colitis - now resolved Plan  -continue on sandostatin today and q4weeks.  -continue on Lialda  #4 DVT and PE  -continue on Xarelto - no issues with bleeding  #5  Barrett's esophagus 4cms in the distal esophagus with low and high-grade dysplasia cannot rule out an early intra-mucosal esophageal adenocarcinoma. Plan -Patient being monitored by Dr. [name] from GI. -patient notes that she does not want to f/u at [facility] for further intervention as per GI plan.   CXR today CT chest/abd/pelvis in 3 weeks RTC with Dr Irene Limbo with labs in 4 weeks Continue Sandostatin q4weeks  All questions were answered. The patient knows to call the clinic with any problems, questions or concerns.  The total time spent in the appt was 25 minutes and more than 50% was on counseling and direct patient cares.    Sullivan Lone MD Cindy Hematology/Oncology Physician [facility]  (Office): [phone] (Work cell): [phone] (Fax): [phone]  I, Baldwin [place], am acting as a scribe for Dr Irene Limbo.   .I have reviewed the above documentation for accuracy and completeness, and I agree with the above. [name] MD

## 2018-06-28 ENCOUNTER — Inpatient Hospital Stay: Payer: Medicare Other

## 2018-06-28 ENCOUNTER — Ambulatory Visit (HOSPITAL_COMMUNITY)
Admission: RE | Admit: 2018-06-28 | Discharge: 2018-06-28 | Disposition: A | Discharge status: Home / Self Care | Payer: Medicare Other | Source: Ambulatory Visit | Attending: Hematology | Admitting: Hematology

## 2018-06-28 ENCOUNTER — Encounter: Payer: Self-pay | Admitting: Hematology

## 2018-06-28 ENCOUNTER — Inpatient Hospital Stay: Payer: Medicare Other | Attending: Hematology | Admitting: Hematology

## 2018-06-28 ENCOUNTER — Telehealth: Payer: Self-pay | Admitting: Hematology

## 2018-06-28 VITALS — BP 131/70 | HR 64 | Temp 98.0°F | Resp 18 | Ht 69.0 in | Wt 153.0 lb

## 2018-06-28 DIAGNOSIS — C3491 Malignant neoplasm of unspecified part of right bronchus or lung: Secondary | ICD-10-CM | POA: Insufficient documentation

## 2018-06-28 DIAGNOSIS — C7951 Secondary malignant neoplasm of bone: Secondary | ICD-10-CM | POA: Insufficient documentation

## 2018-06-28 DIAGNOSIS — I2699 Other pulmonary embolism without acute cor pulmonale: Secondary | ICD-10-CM

## 2018-06-28 DIAGNOSIS — C3490 Malignant neoplasm of unspecified part of unspecified bronchus or lung: Secondary | ICD-10-CM | POA: Diagnosis not present

## 2018-06-28 DIAGNOSIS — R194 Change in bowel habit: Secondary | ICD-10-CM

## 2018-06-28 DIAGNOSIS — K2270 Barrett's esophagus without dysplasia: Secondary | ICD-10-CM

## 2018-06-28 DIAGNOSIS — R0602 Shortness of breath: Secondary | ICD-10-CM

## 2018-06-28 DIAGNOSIS — Z95828 Presence of other vascular implants and grafts: Secondary | ICD-10-CM

## 2018-06-28 DIAGNOSIS — I82409 Acute embolism and thrombosis of unspecified deep veins of unspecified lower extremity: Secondary | ICD-10-CM | POA: Diagnosis not present

## 2018-06-28 DIAGNOSIS — Z87891 Personal history of nicotine dependence: Secondary | ICD-10-CM

## 2018-06-28 LAB — CBC WITH DIFFERENTIAL/PLATELET
Basophils Absolute: 0.1 10*3/uL (ref 0.0–0.1)
Basophils Relative: 1 %
EOS PCT: 2 %
Eosinophils Absolute: 0.2 10*3/uL (ref 0.0–0.5)
HEMATOCRIT: 33.4 % — AB (ref 34.8–46.6)
Hemoglobin: 10.6 g/dL — ABNORMAL LOW (ref 11.6–15.9)
LYMPHS ABS: 1.6 10*3/uL (ref 0.9–3.3)
LYMPHS PCT: 24 %
MCH: 28 pg (ref 25.1–34.0)
MCHC: 31.7 g/dL (ref 31.5–36.0)
MCV: 88.1 fL (ref 79.5–101.0)
MONO ABS: 0.5 10*3/uL (ref 0.1–0.9)
Monocytes Relative: 7 %
NEUTROS ABS: 4.4 10*3/uL (ref 1.5–6.5)
Neutrophils Relative %: 66 %
PLATELETS: 207 10*3/uL (ref 145–400)
RBC: 3.79 MIL/uL (ref 3.70–5.45)
RDW: 14.3 % (ref 11.2–14.5)
WBC: 6.7 10*3/uL (ref 3.9–10.3)

## 2018-06-28 LAB — CMP (CANCER CENTER ONLY)
ALT: 19 U/L (ref 0–44)
ANION GAP: 6 (ref 5–15)
AST: 28 U/L (ref 15–41)
Albumin: 3.5 g/dL (ref 3.5–5.0)
Alkaline Phosphatase: 65 U/L (ref 38–126)
BUN: 17 mg/dL (ref 8–23)
CHLORIDE: 108 mmol/L (ref 98–111)
CO2: 25 mmol/L (ref 22–32)
Calcium: 9.6 mg/dL (ref 8.9–10.3)
Creatinine: 1.2 mg/dL — ABNORMAL HIGH (ref 0.44–1.00)
GFR, Est AFR Am: 49 mL/min — ABNORMAL LOW (ref >60)
GFR, Estimated: 42 mL/min — ABNORMAL LOW (ref >60)
GLUCOSE: 111 mg/dL — AB (ref 70–99)
POTASSIUM: 3.9 mmol/L (ref 3.5–5.1)
SODIUM: 139 mmol/L (ref 135–145)
Total Bilirubin: 0.3 mg/dL (ref 0.3–1.2)
Total Protein: 7.7 g/dL (ref 6.5–8.1)

## 2018-06-28 MED ORDER — EPINEPHRINE HCL 0.1 MG/ML IJ SOLN: 0.2500 mg | Freq: Once | INTRAMUSCULAR | Status: DC | PRN

## 2018-06-28 MED ORDER — OCTREOTIDE ACETATE 30 MG IM KIT
PACK | INTRAMUSCULAR | Status: AC
  Filled 2018-06-28: qty 1

## 2018-06-28 MED ORDER — DIPHENHYDRAMINE HCL 50 MG/ML IJ SOLN: 25.0000 mg | Freq: Once | INTRAMUSCULAR | Status: DC | PRN

## 2018-06-28 MED ORDER — METHYLPREDNISOLONE SODIUM SUCC 125 MG IJ SOLR: 125.0000 mg | Freq: Once | INTRAMUSCULAR | Status: DC | PRN

## 2018-06-28 MED ORDER — DIPHENHYDRAMINE HCL 50 MG/ML IJ SOLN: 50.0000 mg | Freq: Once | INTRAMUSCULAR | Status: DC | PRN

## 2018-06-28 MED ORDER — SODIUM CHLORIDE 0.9% FLUSH
10.0000 mL | Freq: Once | INTRAVENOUS | Status: AC
  Administered 2018-06-28: 10 mL
  Filled 2018-06-28: qty 10

## 2018-06-28 MED ORDER — ALBUTEROL SULFATE (2.5 MG/3ML) 0.083% IN NEBU
2.5000 mg | INHALATION_SOLUTION | Freq: Once | RESPIRATORY_TRACT | Status: DC | PRN
  Filled 2018-06-28: qty 3

## 2018-06-28 MED ORDER — HEPARIN SOD (PORK) LOCK FLUSH 100 UNIT/ML IV SOLN
500.0000 [IU] | Freq: Once | INTRAVENOUS | Status: AC
  Administered 2018-06-28: 500 [IU]
  Filled 2018-06-28: qty 5

## 2018-06-28 MED ORDER — OCTREOTIDE ACETATE 30 MG IM KIT
30.0000 mg | PACK | Freq: Once | INTRAMUSCULAR | Status: AC
  Administered 2018-06-28: 30 mg via INTRAMUSCULAR

## 2018-06-28 MED ORDER — SODIUM CHLORIDE 0.9 % IV SOLN
Freq: Once | INTRAVENOUS | Status: DC | PRN
  Filled 2018-06-28: qty 250

## 2018-06-28 NOTE — Telephone Encounter (Signed)
Gave pt ave and calendar with appts per 7/25 los.

## 2018-06-29 LAB — VITAMIN D 25 HYDROXY (VIT D DEFICIENCY, FRACTURES): Vit D, 25-Hydroxy: 32.6 ng/mL (ref 30.0–100.0)

## 2018-07-03 ENCOUNTER — Other Ambulatory Visit: Payer: Self-pay | Admitting: Hematology

## 2018-07-19 ENCOUNTER — Ambulatory Visit (HOSPITAL_COMMUNITY)
Admission: RE | Admit: 2018-07-19 | Discharge: 2018-07-19 | Disposition: A | Discharge status: Home / Self Care | Payer: Medicare Other | Source: Ambulatory Visit | Attending: Hematology | Admitting: Hematology

## 2018-07-19 DIAGNOSIS — C3490 Malignant neoplasm of unspecified part of unspecified bronchus or lung: Secondary | ICD-10-CM | POA: Diagnosis not present

## 2018-07-19 DIAGNOSIS — I700 Atherosclerosis of aorta: Secondary | ICD-10-CM | POA: Diagnosis not present

## 2018-07-19 DIAGNOSIS — J439 Emphysema, unspecified: Secondary | ICD-10-CM | POA: Insufficient documentation

## 2018-07-19 DIAGNOSIS — M899 Disorder of bone, unspecified: Secondary | ICD-10-CM | POA: Diagnosis not present

## 2018-07-19 DIAGNOSIS — C3491 Malignant neoplasm of unspecified part of right bronchus or lung: Secondary | ICD-10-CM | POA: Diagnosis not present

## 2018-07-19 MED ORDER — IOHEXOL 300 MG/ML  SOLN
100.0000 mL | Freq: Once | INTRAMUSCULAR | Status: AC | PRN
  Administered 2018-07-19: 80 mL via INTRAVENOUS

## 2018-07-25 NOTE — Progress Notes (Signed)
Cindy Byrd    HEMATOLOGY ONCOLOGY PROGRESS NOTE  Date of service:  07/26/18     Patient Care Team: Cindy Grandchild, NP as PCP - General (Family Medicine) Cindy Genera, MD as Consulting Physician (Hematology and Oncology)  CHIEF COMPLAINTS/PURPOSE OF CONSULTATION:  F/u for metastatic lung cancer  DIAGNOSIS:   #1 Metastatic non-small cell lung cancer with bilateral lung nodules and large metastatic lesion in the left Ilium. #2 Barrett's esophagus with some evidence of intramucosal adenocarcinoma of the esophagus. 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 Also had c diff colitis - treated   Current Treatment  1) Active surveillance 2) Xgeva 172m [place] q4weeks for bone metastases. 3) Sandostatin q4weeks for diarrhea - immune colitis  Previous Treatment  1 Palliative radiation therapy to the large left ilium metastases 2. IV Nivolumab x 20ycles (discontinued due to likely immune colitis) 3. Xgeva 1257mSC q4weeks for bone metastases.   HISTORY OF PRESENTING ILLNESS: (plz see my previous consultation for details of initial presentation)  INTERVAL HISTORY:   Cindy Byrd here for her scheduled follow-up for metastatic lung cancer. The patient's last visit with [place] on 06/28/18. The pt reports that she is doing well overall.   The pt reports that she has not been quite as active in the interim due to the hot weather, and has found herself more fatigued. She notes that she has not had any other concerns in the interim and continues to do everything she wants to do in life. She notes that she continues to occasionally has some diarrhea.   Her last dental procedure was on 07/02/18.   Of note since the patient's last visit, pt has had CT C/A/P completed on 07/19/18 with results revealing Stable exam.  No new or progressive interval findings. Large destructive left iliac lesion is similar to prior. Aortic  Atherosclerosis and Emphysema.   Lab results today (07/26/18) of CBC w/diff and Reticulocytes is as follows: all values are WNL except for HGB at 10.8, HCT at 34.0. CMP 07/26/18 is creatnine slightly higher at 1.44. Ferritin 07/26/18 is 10 Iron/TIBC 07/26/18 is 14%  On review of systems, pt reports some fatigue, occasional diarrhea, and denies any other symptoms.    MEDICAL HISTORY:  Past Medical History:  Diagnosis Date  . Barrett's esophagus   . Bone neoplasm 06/24/2015  [facility]   metastatic poorly differentiated carcinoma. tumor left groin surgical removal with radiation tx.  . Cataract    BILATERAL  . Cigarette smoker two packs a day or less    Currently still smoking 2 PPD - Not interested in quitting at this time.  . Colitis 2017  . Colon polyps    hyperplastic, tubular adenomas, tubulovillous adenoma  . Cough, persistent    hx. lung cancer ? primary-being evaluated, unsure of primary site.  . Depression 06/24/2015  . Diverticulosis   . [place]  . Endometriosis    Hysterectomy with BSO at age [age]rs  . [place]ophageal [place]   intramucosal  . Gastritis   . GERD (gastroesophageal reflux disease)   . H/O: pneumonia   . Hiatal hernia   . Hyperlipidemia   . Hypertension 06/24/2015   likely improved incidental to 40 lbs weight loss from her neoplasm. No Longer taking med for this as of 08-06-15  . IBS (irritable bowel syndrome)   . Pain    left hip-persistent"tumor  of bone"-radiation tx. 10.  . Vitamin D deficiency disease    SURGICAL HISTORY: Past Surgical History:  Procedure Laterality Date  . BARTHOLIN GLAND CYST EXCISION  77 yo ago   Does not want if it was an infected cyst or tumor. Was soon as delivery  . COLONOSCOPY W/ POLYPECTOMY     multiple times - last done 09/2014 per patient.  . ESOPHAGOGASTRODUODENOSCOPY (EGD) WITH PROPOFOL N/A 08/11/2015   Procedure: ESOPHAGOGASTRODUODENOSCOPY (EGD) WITH PROPOFOL;  Surgeon: [name], MD;  Location: [facility];  Service: Gastroenterology;  Laterality: N/A;  . FLEXIBLE SIGMOIDOSCOPY N/A 06/24/2017   Procedure: FLEXIBLE SIGMOIDOSCOPY;  Surgeon: Manus Gunning, MD;  Location: [facility];  Service: Gastroenterology;  Laterality: N/A;  . GANGLION CYST EXCISION    . KNEE ARTHROSCOPY  age about 77 yrs  . TONSILLECTOMY    . TOTAL ABDOMINAL HYSTERECTOMY W/ BILATERAL SALPINGOOPHORECTOMY  at age 73 yrs   For endometriosis    SOCIAL HISTORY: Social History   Socioeconomic History  . Marital status: Widowed    Spouse name: Not on file  . Number of children: 2  . Years of education: Not on file  . Highest education level: Not on file  Occupational History  . Not on file  Social Needs  . Financial resource strain: Not on file  . Food insecurity:    Worry: Not on file    Inability: Not on file  . Transportation needs:    Medical: Not on file    Non-medical: Not on file  Tobacco Use  . Smoking status: Former Smoker    Packs/day: 1.00    Years: 60.00    Pack years: 60.00    Types: Cigarettes    Last attempt to quit: 12/06/2015    Years since quitting: 2.6  . Smokeless tobacco: Never Used  Substance and Sexual Activity  . Alcohol use: No    Alcohol/week: 0.0 standard drinks  . Drug use: No  . Sexual activity: Never  Lifestyle  . Physical activity:    Days per week: Not on file    Minutes per session: Not on file  . Stress: Not on file  Relationships  . Social connections:    Talks on phone: Not on file    Gets together: Not on file    Attends religious service: Not on file    Active member of club or organization: Not on file    Attends meetings of clubs or organizations: Not on file    Relationship status: Not on file  . Intimate partner violence:    Fear of current or ex partner: Not on file    Emotionally abused: Not on file    Physically abused: Not on file    Forced sexual activity: Not on file  Other Topics Concern  . Not on file    Social History Narrative  . Not on file    FAMILY HISTORY: Family History  Problem Relation Age of Onset  . Colon cancer Brother   . Colon cancer Brother   . Stroke Mother   . Colon cancer Father   . Breast cancer Daughter 87       ER/PR+ stage II    ALLERGIES:  is allergic to penicillins; remeron [mirtazapine]; and latex. patient wonders if she has a penicillin allergy but notes that she is uncertain about this.  MEDICATIONS:  Current Outpatient Medications  Medication Sig Dispense Refill  . amLODipine (NORVASC) 10 MG tablet Take 1 tablet (10  mg total) by mouth daily. 90 tablet 2  . calcium-vitamin D (OSCAL WITH D) 500-200 MG-UNIT tablet Take 2 tablets by mouth 3 (three) times daily.    Cindy Byrd lidocaine-prilocaine (EMLA) cream Apply small amount over port 1-2 hours prior to treatment, cover with plastic wrap (DO NOT RUB IN). 30 g prn  . LORazepam (ATIVAN) 1 MG tablet Take 1 tablet (1 mg total) by mouth every 8 (eight) hours as needed for anxiety (or nausea). 30 tablet 0  . omeprazole (PRILOSEC) 40 MG capsule TAKE 1 CAPSULE BY MOUTH EVERY DAY 90 capsule 2  . ondansetron (ZOFRAN) 8 MG tablet Take 1 tablet (8 mg total) by mouth every 8 (eight) hours as needed for nausea or vomiting. 60 tablet 0  . saccharomyces boulardii (FLORASTOR) 250 MG capsule Take 1 capsule (250 mg total) by mouth 2 (two) times daily. 60 capsule 0  . traZODone (DESYREL) 50 MG tablet TAKE 1 TABLET BY MOUTH EVERYDAY AT BEDTIME 90 tablet 2  . Vitamin D, Ergocalciferol, (DRISDOL) 50000 units CAPS capsule Take 50,000 Units by mouth every 7 (seven) days.    Alveda Reasons 20 MG TABS tablet TAKE 1 TABLET DAILY WITH SUPPER. DC LOVENOX AND START XARELTO AT THE SCHEDULED TIME AS INSTRUCTED. 30 tablet 3   No current facility-administered medications for this visit.    Facility-Administered Medications Ordered in Other Visits  Medication Dose Route Frequency Provider Last Rate Last Dose  . octreotide (SANDOSTATIN LAR) IM injection  30 mg  30 mg Intramuscular Once Cindy Genera, MD        REVIEW OF SYSTEMS:    A 10+ POINT REVIEW OF SYSTEMS WAS OBTAINED including neurology, dermatology, psychiatry, cardiac, respiratory, lymph, extremities, GI, GU, Musculoskeletal, constitutional, breasts, reproductive, HEENT.  All pertinent positives are noted in the HPI.  All others are negative.   PHYSICAL EXAMINATION: ECOG PERFORMANCE STATUS: 2 - Symptomatic, <50% confined to bed  Vitals:   07/26/18 1413  BP: 138/62  Pulse: 79  Resp: 18  Temp: 97.8 F (36.6 C)  SpO2: 95%   Filed Weights   07/26/18 1413  Weight: 149 lb (67.6 kg)  . Cindy Byrd Wt Readings from Last 3 Encounters:  07/26/18 149 lb (67.6 kg)  06/28/18 153 lb (69.4 kg)  05/03/18 147 lb 3.2 oz (66.8 kg)   GENERAL:alert, in no acute distress and comfortable SKIN: no acute rashes, no significant lesions EYES: conjunctiva are pink and non-injected, sclera anicteric OROPHARYNX: MMM, no exudates, no oropharyngeal erythema or ulceration NECK: supple, no JVD LYMPH:  no palpable lymphadenopathy in the cervical, axillary or inguinal regions LUNGS: clear to auscultation b/l with normal respiratory effort HEART: regular rate & rhythm ABDOMEN:  normoactive bowel sounds , non tender, not distended. No palpable hepatosplenomegaly.  Extremity: no pedal edema PSYCH: alert & oriented x 3 with fluent speech NEURO: no focal motor/sensory deficits   LABORATORY DATA:  I have reviewed the data as listed  . CBC Latest Ref Rng & Units 07/26/2018 06/28/2018 05/31/2018  WBC 3.9 - 10.3 K/uL 6.8 6.7 7.7  Hemoglobin 11.6 - 15.9 g/dL 10.8(L) 10.6(L) 11.1(L)  Hematocrit 34.8 - 46.6 % 34.0(L) 33.4(L) 35.0  Platelets 145 - 400 K/uL 165 207 215    . CMP Latest Ref Rng & Units 07/26/2018 06/28/2018 05/31/2018  Glucose 70 - 99 mg/dL 161(H) 111(H) 108(H)  BUN 8 - 23 mg/dL 21 17 21   Creatinine 0.44 - 1.00 mg/dL 1.44(H) 1.20(H) 1.13(H)  Sodium 135 - 145 mmol/L 140 139 139  Potassium  3.5  - 5.1 mmol/L 4.3 3.9 4.5  Chloride 98 - 111 mmol/L 105 108 104  CO2 22 - 32 mmol/L 26 25 29   Calcium 8.9 - 10.3 mg/dL 10.2 9.6 9.8  Total Protein 6.5 - 8.1 g/dL 7.5 7.7 7.8  Total Bilirubin 0.3 - 1.2 mg/dL 0.2(L) 0.3 0.3  Alkaline Phos 38 - 126 U/L 79 65 63  AST 15 - 41 U/L 26 28 18   ALT 0 - 44 U/L 21 19 12       RADIOGRAPHIC STUDIES:  .Dg Chest 2 View  Result Date: 06/28/2018 CLINICAL DATA:  History of lung carcinoma.  Shortness of breath. EXAM: CHEST - 2 VIEW COMPARISON:  Chest CT, 04/26/2018.  Chest radiographs, 02/21/2018. FINDINGS: Cardiac silhouette is normal in size. No mediastinal hilar masses. No evidence of adenopathy. Lungs are hyperexpanded. Mild chronic scarring at the base, stable. Lungs otherwise clear. No pleural effusion or pneumothorax. Right anterior chest wall Port-A-Cath is stable. No acute skeletal abnormality. IMPRESSION: No acute cardiopulmonary disease. Electronically Signed   By: Lajean Manes M.D.   On: 06/28/2018 15:38   Ct Chest W Contrast  Result Date: 07/19/2018 CLINICAL DATA:  Followup right lung cancer. EXAM: CT CHEST, ABDOMEN, AND PELVIS WITH CONTRAST TECHNIQUE: Multidetector CT imaging of the chest, abdomen and pelvis was performed following the standard protocol during bolus administration of intravenous contrast. CONTRAST:  81m OMNIPAQUE IOHEXOL 300 MG/ML  SOLN COMPARISON:  04/26/2018 FINDINGS: CT CHEST FINDINGS Cardiovascular: Normal heart size. No pericardial effusion. Calcification in the left main coronary artery, LAD identified. Mediastinum/Nodes: Normal appearance of the thyroid gland. The trachea appears patent and is midline. Normal appearance of the esophagus. No supraclavicular or axillary adenopathy. Stable appearance of small posterior mediastinal lymph nodes adjacent to esophagus measuring up to 7 mm. No mediastinal or hilar adenopathy. Lungs/Pleura: No pleural effusions. Moderate changes of emphysema. Scarring identified within the posterior  lung bases. The 4 mm nodule within the posterior lateral right lower lobe is unchanged, image 105/4. No new pulmonary nodules identified. Musculoskeletal: Chronic left posterior rib fractures. No aggressive lytic or sclerotic bone lesions identified. CT ABDOMEN PELVIS FINDINGS Hepatobiliary: Unchanged appearance of scattered liver cysts. No suspicious liver lesions identified. Gallbladder appears normal. No biliary dilatation. Pancreas: Unremarkable. No pancreatic ductal dilatation or surrounding inflammatory changes. Spleen: Normal in size without focal abnormality. Adrenals/Urinary Tract: Normal adrenal glands. The kidneys are unremarkable. No mass or hydronephrosis identified. Urinary bladder negative. Stomach/Bowel: Stomach is within normal limits. Appendix appears normal. No evidence of bowel wall thickening, distention, or inflammatory changes. Vascular/Lymphatic: Aortic atherosclerosis without aneurysm. No adenopathy identified within the abdomen. Left common iliac node measures 0.9 cm and is unchanged from previous exam. Reproductive: Status post hysterectomy. No adnexal masses. Other: No abdominal wall hernia or abnormality. No abdominopelvic ascites. Musculoskeletal: Are large destructive mixed lytic and sclerotic bone lesion involving the left iliac wing with multiple chronic appearing pathologic fractures appears similar to previous study. No new bone lesions identified. IMPRESSION: 1. Stable exam.  No new or progressive interval findings. 2. Large destructive left iliac lesion is similar to prior. 3. Aortic Atherosclerosis (ICD10-I70.0) and Emphysema (ICD10-J43.9). Electronically Signed   By: TKerby MoorsM.D.   On: 07/19/2018 12:12   Ct Abdomen Pelvis W Contrast  Result Date: 07/19/2018 CLINICAL DATA:  Followup right lung cancer. EXAM: CT CHEST, ABDOMEN, AND PELVIS WITH CONTRAST TECHNIQUE: Multidetector CT imaging of the chest, abdomen and pelvis was performed following the standard protocol  during bolus administration of intravenous contrast. CONTRAST:  42m OMNIPAQUE IOHEXOL 300 MG/ML  SOLN COMPARISON:  04/26/2018 FINDINGS: CT CHEST FINDINGS Cardiovascular: Normal heart size. No pericardial effusion. Calcification in the left main coronary artery, LAD identified. Mediastinum/Nodes: Normal appearance of the thyroid gland. The trachea appears patent and is midline. Normal appearance of the esophagus. No supraclavicular or axillary adenopathy. Stable appearance of small posterior mediastinal lymph nodes adjacent to esophagus measuring up to 7 mm. No mediastinal or hilar adenopathy. Lungs/Pleura: No pleural effusions. Moderate changes of emphysema. Scarring identified within the posterior lung bases. The 4 mm nodule within the posterior lateral right lower lobe is unchanged, image 105/4. No new pulmonary nodules identified. Musculoskeletal: Chronic left posterior rib fractures. No aggressive lytic or sclerotic bone lesions identified. CT ABDOMEN PELVIS FINDINGS Hepatobiliary: Unchanged appearance of scattered liver cysts. No suspicious liver lesions identified. Gallbladder appears normal. No biliary dilatation. Pancreas: Unremarkable. No pancreatic ductal dilatation or surrounding inflammatory changes. Spleen: Normal in size without focal abnormality. Adrenals/Urinary Tract: Normal adrenal glands. The kidneys are unremarkable. No mass or hydronephrosis identified. Urinary bladder negative. Stomach/Bowel: Stomach is within normal limits. Appendix appears normal. No evidence of bowel wall thickening, distention, or inflammatory changes. Vascular/Lymphatic: Aortic atherosclerosis without aneurysm. No adenopathy identified within the abdomen. Left common iliac node measures 0.9 cm and is unchanged from previous exam. Reproductive: Status post hysterectomy. No adnexal masses. Other: No abdominal wall hernia or abnormality. No abdominopelvic ascites. Musculoskeletal: Are large destructive mixed lytic and  sclerotic bone lesion involving the left iliac wing with multiple chronic appearing pathologic fractures appears similar to previous study. No new bone lesions identified. IMPRESSION: 1. Stable exam.  No new or progressive interval findings. 2. Large destructive left iliac lesion is similar to prior. 3. Aortic Atherosclerosis (ICD10-I70.0) and Emphysema (ICD10-J43.9). Electronically Signed   By: TKerby MoorsM.D.   On: 07/19/2018 12:12    ASSESSMENT & PLAN:   77y.o. female with  #1 Metastatic poorly differentiated carcinoma with likely lung primary non-small cell lung cancer.  CT of the head with and without contrast showed no evidence of metastatic disease. Patient notes much improved pain control. EGFR blood test mutation analysis negative. Patient's pain is much better controlled after radiation for the painful ilium met. CT chest abdomen pelvis 04/19/2016 shows no evidence of disease progression. Patient tolerated Nivolumab very well but was discontinued when she developed grade 2 Immune colitis. Has been off Nivolumab for >6 months  CT chest abdomen pelvis on 06/24/2016 shows no evidence of new disease or progression of metastatic disease. CT chest abdomen pelvis 09/06/2016 shows 1. Mixed interval response to therapy. 2. There is a new left ventral chest wall lesion deep to the pectoralis musculature worrisome for metastatic disease. 3. Posterior lower lobe nodular densities are identified which may reflect areas of pulmonary metastasis. 4. Interval decrease in size of destructive lesion involving the left iliac bone.  CT chest abd pelvis 12/08/2016: Cystic mass involving the left ventral chest wall has resolved in the interval. Likely was a hematoma due to trauma. Interval increase in size of pleural base mass overlying the posterior and inferior left lower lobe. There is also a new left pleural effusion identified.  CT chest 02/01/2017: Residual irregular soft tissue thickening/volume loss  and trace left pleural fluid at the base of the left hemithorax, overall improved in appearance from 12/08/2016. No measurable lesion.  CT chest 05/29/2017 shows no residual pleural based mass or significant pleural effusion in the left hemithorax. No evidence of thoracic metastatic disease. No evidence of  progressive metastatic disease within the abdomen or pelvis. Mixed lytic and blastic lesion involving the left iliac bone and associated pathologic fracture are unchanged.   CT CAP 09/14/17 shows no new changes. She does have slight displacement of her fractured left iliac bone. Evidence of stable disease.   CT CAP 01/04/2018- No new or progressive metastatic disease. Stable large left iliac bone metastasis with associated chronic pathologic fracture.   CT chest/abd/pelvis done on 04/26/18 revealed Stable exam.  No new or progressive interval findings.  PLAN: -Discussed that she is clear from my perspective to receive cataracts surgery, if she holds her anticoagulation. 48h prior to procedure. -Continue to limit salt intake with food and begin using sports compression socks for occasional ankle swelling -Trazodone for patient's sleep difficulties -2000 units Vitamin D daily -- adjust dose to maintain 25OH vit D levels of 40-60 -Discussed pt labwork today, 07/26/18; blood counts are stable,  -Discussed the 07/19/18 CT C/A/P which revealed Stable exam.  No new or progressive interval findings. Large destructive left iliac lesion is similar to prior. Aortic Atherosclerosis and Emphysema.  -Will see pt back in 2 months with Xgeva shot as her last dental procedure was on 07/02/18   #2 diarrhea-  now resolved was previously. 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 She was previously on on Lialda, budesonide,probiotics and lomotil but not currently taking any of these. Plan -Continue  Sandostatin every 4 weeks  #3 h/o diverticulitis and c dff colitis - now resolved Plan  -continue on sandostatin today and q4weeks.  -continue on Lialda  #4 DVT and PE  -continue on Xarelto - no issues with bleeding  #5 Barrett's esophagus 4cms in the distal esophagus with low and high-grade dysplasia cannot rule out an early intra-mucosal esophageal adenocarcinoma. Plan -Patient being monitored by Dr. Hilarie Byrd from GI. -patient notes that she does not want to f/u at [facility] for further intervention as per GI plan.  Continue monthly Sandostatin Continue monthly Xgeva starting in 4 weeks RTC with Dr Irene Limbo with labs in 2 months   All questions were answered. The patient knows to call the clinic with any problems, questions or concerns.  The total time spent in the appt was 25 minutes and more than 50% was on counseling and direct patient cares.    Sullivan Lone MD Cindy Hematology/Oncology Physician [facility]  (Office): [phone] (Work cell): [phone] (Fax): [phone]  I, Baldwin [place], am acting as a scribe for Dr. Irene Limbo  .I have reviewed the above documentation for accuracy and completeness, and I agree with the above. Cindy Genera MD

## 2018-07-26 ENCOUNTER — Inpatient Hospital Stay: Payer: Medicare Other | Attending: Hematology

## 2018-07-26 ENCOUNTER — Inpatient Hospital Stay: Payer: Medicare Other

## 2018-07-26 ENCOUNTER — Encounter: Payer: Self-pay | Admitting: Hematology

## 2018-07-26 ENCOUNTER — Telehealth: Payer: Self-pay | Admitting: Hematology

## 2018-07-26 ENCOUNTER — Inpatient Hospital Stay (HOSPITAL_BASED_OUTPATIENT_CLINIC_OR_DEPARTMENT_OTHER): Payer: Medicare Other | Admitting: Hematology

## 2018-07-26 VITALS — BP 138/62 | HR 79 | Temp 97.8°F | Resp 18 | Ht 69.0 in | Wt 149.0 lb

## 2018-07-26 DIAGNOSIS — C3491 Malignant neoplasm of unspecified part of right bronchus or lung: Secondary | ICD-10-CM | POA: Insufficient documentation

## 2018-07-26 DIAGNOSIS — C3490 Malignant neoplasm of unspecified part of unspecified bronchus or lung: Secondary | ICD-10-CM

## 2018-07-26 DIAGNOSIS — Z87891 Personal history of nicotine dependence: Secondary | ICD-10-CM | POA: Diagnosis not present

## 2018-07-26 DIAGNOSIS — C7951 Secondary malignant neoplasm of bone: Secondary | ICD-10-CM | POA: Diagnosis not present

## 2018-07-26 DIAGNOSIS — J439 Emphysema, unspecified: Secondary | ICD-10-CM | POA: Diagnosis not present

## 2018-07-26 DIAGNOSIS — Z7901 Long term (current) use of anticoagulants: Secondary | ICD-10-CM | POA: Diagnosis not present

## 2018-07-26 DIAGNOSIS — I2699 Other pulmonary embolism without acute cor pulmonale: Secondary | ICD-10-CM

## 2018-07-26 DIAGNOSIS — K2270 Barrett's esophagus without dysplasia: Secondary | ICD-10-CM | POA: Diagnosis not present

## 2018-07-26 DIAGNOSIS — I82409 Acute embolism and thrombosis of unspecified deep veins of unspecified lower extremity: Secondary | ICD-10-CM | POA: Diagnosis not present

## 2018-07-26 DIAGNOSIS — I700 Atherosclerosis of aorta: Secondary | ICD-10-CM | POA: Insufficient documentation

## 2018-07-26 DIAGNOSIS — E559 Vitamin D deficiency, unspecified: Secondary | ICD-10-CM

## 2018-07-26 DIAGNOSIS — I82401 Acute embolism and thrombosis of unspecified deep veins of right lower extremity: Secondary | ICD-10-CM

## 2018-07-26 DIAGNOSIS — Z95828 Presence of other vascular implants and grafts: Secondary | ICD-10-CM

## 2018-07-26 DIAGNOSIS — R197 Diarrhea, unspecified: Secondary | ICD-10-CM

## 2018-07-26 LAB — CBC WITH DIFFERENTIAL (CANCER CENTER ONLY)
Basophils Absolute: 0.1 10*3/uL (ref 0.0–0.1)
Basophils Relative: 1 %
Eosinophils Absolute: 0.3 10*3/uL (ref 0.0–0.5)
Eosinophils Relative: 4 %
HCT: 34 % — ABNORMAL LOW (ref 34.8–46.6)
HEMOGLOBIN: 10.8 g/dL — AB (ref 11.6–15.9)
LYMPHS ABS: 1.6 10*3/uL (ref 0.9–3.3)
LYMPHS PCT: 24 %
MCH: 28 pg (ref 25.1–34.0)
MCHC: 31.8 g/dL (ref 31.5–36.0)
MCV: 88.1 fL (ref 79.5–101.0)
MONOS PCT: 5 %
Monocytes Absolute: 0.3 10*3/uL (ref 0.1–0.9)
NEUTROS PCT: 66 %
Neutro Abs: 4.6 10*3/uL (ref 1.5–6.5)
Platelet Count: 165 10*3/uL (ref 145–400)
RBC: 3.86 MIL/uL (ref 3.70–5.45)
RDW: 14.3 % (ref 11.2–14.5)
WBC Count: 6.8 10*3/uL (ref 3.9–10.3)

## 2018-07-26 LAB — CMP (CANCER CENTER ONLY)
ALBUMIN: 3.4 g/dL — AB (ref 3.5–5.0)
ALT: 21 U/L (ref 0–44)
AST: 26 U/L (ref 15–41)
Alkaline Phosphatase: 79 U/L (ref 38–126)
Anion gap: 9 (ref 5–15)
BILIRUBIN TOTAL: 0.2 mg/dL — AB (ref 0.3–1.2)
BUN: 21 mg/dL (ref 8–23)
CO2: 26 mmol/L (ref 22–32)
CREATININE: 1.44 mg/dL — AB (ref 0.44–1.00)
Calcium: 10.2 mg/dL (ref 8.9–10.3)
Chloride: 105 mmol/L (ref 98–111)
GFR, EST NON AFRICAN AMERICAN: 34 mL/min — AB (ref >60)
GFR, Est AFR Am: 39 mL/min — ABNORMAL LOW (ref >60)
GLUCOSE: 161 mg/dL — AB (ref 70–99)
Potassium: 4.3 mmol/L (ref 3.5–5.1)
Sodium: 140 mmol/L (ref 135–145)
TOTAL PROTEIN: 7.5 g/dL (ref 6.5–8.1)

## 2018-07-26 LAB — RETICULOCYTES
RBC.: 3.86 MIL/uL (ref 3.70–5.45)
RETIC CT PCT: 1.1 % (ref 0.7–2.1)
Retic Count, Absolute: 42.5 10*3/uL (ref 33.7–90.7)

## 2018-07-26 LAB — IRON AND TIBC
Iron: 41 ug/dL (ref 41–142)
Saturation Ratios: 14 % — ABNORMAL LOW (ref 21–57)
TIBC: 299 ug/dL (ref 236–444)
UIBC: 258 ug/dL

## 2018-07-26 LAB — FERRITIN: Ferritin: 10 ng/mL — ABNORMAL LOW (ref 11–307)

## 2018-07-26 MED ORDER — OCTREOTIDE ACETATE 30 MG IM KIT
PACK | INTRAMUSCULAR | Status: AC
  Filled 2018-07-26: qty 1

## 2018-07-26 MED ORDER — OCTREOTIDE ACETATE 30 MG IM KIT
30.0000 mg | PACK | Freq: Once | INTRAMUSCULAR | Status: AC
  Administered 2018-07-26: 30 mg via INTRAMUSCULAR

## 2018-07-26 MED ORDER — VITAMIN D (ERGOCALCIFEROL) 1.25 MG (50000 UNIT) PO CAPS: 50000.0000 [IU] | ORAL_CAPSULE | ORAL | 3 refills | Status: DC

## 2018-07-26 NOTE — Patient Instructions (Signed)

## 2018-07-26 NOTE — Telephone Encounter (Signed)
Appts scheduled AVS/Calendar printed per 8/22 los

## 2018-07-26 NOTE — Telephone Encounter (Signed)
Appts scheduled AVS/ Calendar printed per 8/22 los

## 2018-08-21 DIAGNOSIS — H2512 Age-related nuclear cataract, left eye: Secondary | ICD-10-CM | POA: Diagnosis not present

## 2018-08-21 DIAGNOSIS — H5203 Hypermetropia, bilateral: Secondary | ICD-10-CM | POA: Diagnosis not present

## 2018-08-21 DIAGNOSIS — H2513 Age-related nuclear cataract, bilateral: Secondary | ICD-10-CM | POA: Diagnosis not present

## 2018-08-21 DIAGNOSIS — H25013 Cortical age-related cataract, bilateral: Secondary | ICD-10-CM | POA: Diagnosis not present

## 2018-08-21 DIAGNOSIS — H52203 Unspecified astigmatism, bilateral: Secondary | ICD-10-CM | POA: Diagnosis not present

## 2018-08-23 ENCOUNTER — Inpatient Hospital Stay: Payer: Medicare Other | Attending: Hematology

## 2018-08-23 ENCOUNTER — Inpatient Hospital Stay: Payer: Medicare Other

## 2018-08-23 DIAGNOSIS — C3491 Malignant neoplasm of unspecified part of right bronchus or lung: Secondary | ICD-10-CM | POA: Diagnosis not present

## 2018-08-23 DIAGNOSIS — C3490 Malignant neoplasm of unspecified part of unspecified bronchus or lung: Secondary | ICD-10-CM

## 2018-08-23 DIAGNOSIS — Z95828 Presence of other vascular implants and grafts: Secondary | ICD-10-CM

## 2018-08-23 DIAGNOSIS — C7951 Secondary malignant neoplasm of bone: Secondary | ICD-10-CM | POA: Insufficient documentation

## 2018-08-23 LAB — CBC WITH DIFFERENTIAL/PLATELET
Basophils Absolute: 0.1 10*3/uL (ref 0.0–0.1)
Basophils Relative: 1 %
EOS PCT: 3 %
Eosinophils Absolute: 0.2 10*3/uL (ref 0.0–0.5)
HCT: 34.9 % (ref 34.8–46.6)
Hemoglobin: 11.4 g/dL — ABNORMAL LOW (ref 11.6–15.9)
LYMPHS ABS: 1.7 10*3/uL (ref 0.9–3.3)
LYMPHS PCT: 26 %
MCH: 28 pg (ref 25.1–34.0)
MCHC: 32.6 g/dL (ref 31.5–36.0)
MCV: 85.7 fL (ref 79.5–101.0)
Monocytes Absolute: 0.6 10*3/uL (ref 0.1–0.9)
Monocytes Relative: 9 %
Neutro Abs: 4.1 10*3/uL (ref 1.5–6.5)
Neutrophils Relative %: 61 %
PLATELETS: 174 10*3/uL (ref 145–400)
RBC: 4.07 MIL/uL (ref 3.70–5.45)
RDW: 14.7 % — AB (ref 11.2–14.5)
WBC: 6.8 10*3/uL (ref 3.9–10.3)

## 2018-08-23 LAB — CMP (CANCER CENTER ONLY)
ALT: 7 U/L (ref 0–44)
AST: 20 U/L (ref 15–41)
Albumin: 3.4 g/dL — ABNORMAL LOW (ref 3.5–5.0)
Alkaline Phosphatase: 79 U/L (ref 38–126)
Anion gap: 7 (ref 5–15)
BILIRUBIN TOTAL: 0.4 mg/dL (ref 0.3–1.2)
BUN: 19 mg/dL (ref 8–23)
CO2: 28 mmol/L (ref 22–32)
Calcium: 10 mg/dL (ref 8.9–10.3)
Chloride: 104 mmol/L (ref 98–111)
Creatinine: 1.28 mg/dL — ABNORMAL HIGH (ref 0.44–1.00)
GFR, Est AFR Am: 45 mL/min — ABNORMAL LOW (ref >60)
GFR, Estimated: 39 mL/min — ABNORMAL LOW (ref >60)
GLUCOSE: 97 mg/dL (ref 70–99)
POTASSIUM: 5.1 mmol/L (ref 3.5–5.1)
Sodium: 139 mmol/L (ref 135–145)
TOTAL PROTEIN: 8 g/dL (ref 6.5–8.1)

## 2018-08-23 LAB — RETICULOCYTES
RBC.: 4.09 MIL/uL (ref 3.87–5.11)
Retic Count, Absolute: 32.7 10*3/uL (ref 19.0–186.0)
Retic Ct Pct: 0.8 % (ref 0.4–3.1)

## 2018-08-23 MED ORDER — METHYLPREDNISOLONE SODIUM SUCC 125 MG IJ SOLR: 125.0000 mg | Freq: Once | INTRAMUSCULAR | Status: DC | PRN

## 2018-08-23 MED ORDER — OCTREOTIDE ACETATE 30 MG IM KIT
PACK | INTRAMUSCULAR | Status: AC
  Filled 2018-08-23: qty 1

## 2018-08-23 MED ORDER — OCTREOTIDE ACETATE 30 MG IM KIT
30.0000 mg | PACK | Freq: Once | INTRAMUSCULAR | Status: AC
  Administered 2018-08-23: 30 mg via INTRAMUSCULAR

## 2018-08-23 MED ORDER — EPINEPHRINE HCL 0.1 MG/ML IJ SOLN: 0.2500 mg | Freq: Once | INTRAMUSCULAR | Status: DC | PRN

## 2018-08-23 MED ORDER — DIPHENHYDRAMINE HCL 50 MG/ML IJ SOLN: 25.0000 mg | Freq: Once | INTRAMUSCULAR | Status: DC | PRN

## 2018-08-23 MED ORDER — ALBUTEROL SULFATE (2.5 MG/3ML) 0.083% IN NEBU
2.5000 mg | INHALATION_SOLUTION | Freq: Once | RESPIRATORY_TRACT | Status: DC | PRN
  Filled 2018-08-23: qty 3

## 2018-08-23 MED ORDER — SODIUM CHLORIDE 0.9 % IV SOLN
Freq: Once | INTRAVENOUS | Status: DC | PRN
  Filled 2018-08-23: qty 250

## 2018-08-23 MED ORDER — DIPHENHYDRAMINE HCL 50 MG/ML IJ SOLN: 50.0000 mg | Freq: Once | INTRAMUSCULAR | Status: DC | PRN

## 2018-09-05 DIAGNOSIS — H25012 Cortical age-related cataract, left eye: Secondary | ICD-10-CM | POA: Diagnosis not present

## 2018-09-05 DIAGNOSIS — H2511 Age-related nuclear cataract, right eye: Secondary | ICD-10-CM | POA: Diagnosis not present

## 2018-09-05 DIAGNOSIS — H25011 Cortical age-related cataract, right eye: Secondary | ICD-10-CM | POA: Diagnosis not present

## 2018-09-05 DIAGNOSIS — H2512 Age-related nuclear cataract, left eye: Secondary | ICD-10-CM | POA: Diagnosis not present

## 2018-09-07 ENCOUNTER — Telehealth: Payer: Self-pay | Admitting: Hematology

## 2018-09-07 NOTE — Telephone Encounter (Signed)
Pt called to r/s appts   °

## 2018-09-17 NOTE — Progress Notes (Signed)
[name]    HEMATOLOGY ONCOLOGY PROGRESS NOTE  Date of service:  09/18/18     Patient Care Team: [name], NP as PCP - General (Family Medicine) [name], MD as Consulting Physician (Hematology and Oncology)  CHIEF COMPLAINTS/PURPOSE OF CONSULTATION:  F/u for metastatic lung cancer  DIAGNOSIS:   #1 Metastatic non-small cell lung cancer with bilateral lung nodules and large metastatic lesion in the left Ilium. #2 Barrett's esophagus with some evidence of intramucosal adenocarcinoma of the esophagus. 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 Also had c diff colitis - treated   Current Treatment  1) Active surveillance 2) Xgeva 164m [place] q4weeks for bone metastases. 3) Sandostatin q4weeks for diarrhea - immune colitis  Previous Treatment  1 Palliative radiation therapy to the large left ilium metastases 2. IV Nivolumab x 20ycles (discontinued due to likely immune colitis) 3. Xgeva 1233mSC q4weeks for bone metastases.   HISTORY OF PRESENTING ILLNESS: (plz see my previous consultation for details of initial presentation)  INTERVAL HISTORY:   Cindy Byrd here for her scheduled follow-up for metastatic lung cancer. The patient's last visit with [place] on 07[place]26/18. The pt reports that she is doing well overall.   The pt reports that she has been eating well and has gained some weight in the interim. She notes that her breathing has been stable and denies any difficulty.   She notes that she has had some back pain and left hip pain. She notices this pain when gardening or when walking around more than usual and denies these pains being new or changing.   She has had cataracts surgery and notes that this has greatly improved her eye sight.   Lab results today (09/18/18) of CBC w/diff, CMP, and Reticulocytes is as follows: all values are WNL except for HGB at 11.0, HCT at 35.0, Immature  Retic Fract at 17.9%. 09/18/18 Vitamin D is pending  On review of systems, pt reports gaining weight, eating well, good energy levels, good appetite, lower back pain, left hip pain, moving her bowels well, and denies changes in breathing, difficulty breathing, chest wall pain, rib pain, and any other symptoms.   MEDICAL HISTORY:  Past Medical History:  Diagnosis Date  . Barrett's esophagus   . Bone neoplasm 06/24/2015  . [facility]   metastatic poorly differentiated carcinoma. tumor left groin surgical removal with radiation tx.  . Cataract    BILATERAL  . Cigarette smoker two packs a day or less    Currently still smoking 2 PPD - Not interested in quitting at this time.  . Colitis 2017  . Colon polyps    hyperplastic, tubular adenomas, tubulovillous adenoma  . Cough, persistent    hx. lung cancer ? primary-being evaluated, unsure of primary site.  . Depression 06/24/2015  . Diverticulosis   . [place]  . Endometriosis    Hysterectomy with BSO at age [age]rs  . [place]   intramucosal  . Gastritis   . GERD (gastroesophageal reflux disease)   . H/O: pneumonia   . Hiatal hernia   . Hyperlipidemia   . Hypertension 06/24/2015   likely improved incidental to 40 lbs weight loss from her neoplasm. No Longer taking med for this as of 08-06-15  . IBS (irritable bowel syndrome)   . Pain    left hip-persistent"tumor of bone"-radiation tx. 10.  . Vitamin D  deficiency disease    SURGICAL HISTORY: Past Surgical History:  Procedure Laterality Date  . BARTHOLIN GLAND CYST EXCISION  77 yo ago   Does not want if it was an infected cyst or tumor. Was soon as delivery  . COLONOSCOPY W/ POLYPECTOMY     multiple times - last done 09/2014 per patient.  . ESOPHAGOGASTRODUODENOSCOPY (EGD) WITH PROPOFOL N/A 08/11/2015   Procedure: ESOPHAGOGASTRODUODENOSCOPY (EGD) WITH PROPOFOL;  Surgeon: [name], MD;  Location: [facility];  Service:  Gastroenterology;  Laterality: N/A;  . FLEXIBLE SIGMOIDOSCOPY N/A 06/24/2017   Procedure: FLEXIBLE SIGMOIDOSCOPY;  Surgeon: Manus Gunning, MD;  Location: [facility];  Service: Gastroenterology;  Laterality: N/A;  . GANGLION CYST EXCISION    . KNEE ARTHROSCOPY  age about 67 yrs  . TONSILLECTOMY    . TOTAL ABDOMINAL HYSTERECTOMY W/ BILATERAL SALPINGOOPHORECTOMY  at age 1 yrs   For endometriosis    SOCIAL HISTORY: Social History   Socioeconomic History  . Marital status: Widowed    Spouse name: Not on file  . Number of children: 2  . Years of education: Not on file  . Highest education level: Not on file  Occupational History  . Not on file  Social Needs  . Financial resource strain: Not on file  . Food insecurity:    Worry: Not on file    Inability: Not on file  . Transportation needs:    Medical: Not on file    Non-medical: Not on file  Tobacco Use  . Smoking status: Former Smoker    Packs/day: 1.00    Years: 60.00    Pack years: 60.00    Types: Cigarettes    Last attempt to quit: 12/06/2015    Years since quitting: 2.7  . Smokeless tobacco: Never Used  Substance and Sexual Activity  . Alcohol use: No    Alcohol/week: 0.0 standard drinks  . Drug use: No  . Sexual activity: Never  Lifestyle  . Physical activity:    Days per week: Not on file    Minutes per session: Not on file  . Stress: Not on file  Relationships  . Social connections:    Talks on phone: Not on file    Gets together: Not on file    Attends religious service: Not on file    Active member of club or organization: Not on file    Attends meetings of clubs or organizations: Not on file    Relationship status: Not on file  . Intimate partner violence:    Fear of current or ex partner: Not on file    Emotionally abused: Not on file    Physically abused: Not on file    Forced sexual activity: Not on file  Other Topics Concern  . Not on file  Social History Narrative  . Not on file     FAMILY HISTORY: Family History  Problem Relation Age of Onset  . Colon cancer Brother   . Colon cancer Brother   . Stroke Mother   . Colon cancer Father   . Breast cancer Daughter 66       ER/PR+ stage II    ALLERGIES:  is allergic to penicillins; remeron [mirtazapine]; and latex. patient wonders if she has a penicillin allergy but notes that she is uncertain about this.  MEDICATIONS:  Current Outpatient Medications  Medication Sig Dispense Refill  . amLODipine (NORVASC) 10 MG tablet Take 1 tablet (10 mg total) by mouth daily. 90 tablet 2  .  calcium-vitamin D (OSCAL WITH D) 500-200 MG-UNIT tablet Take 2 tablets by mouth 3 (three) times daily.    [name] lidocaine-prilocaine (EMLA) cream Apply small amount over port 1-2 hours prior to treatment, cover with plastic wrap (DO NOT RUB IN). 30 g prn  . LORazepam (ATIVAN) 1 MG tablet Take 1 tablet (1 mg total) by mouth every 8 (eight) hours as needed for anxiety (or nausea). 30 tablet 0  . omeprazole (PRILOSEC) 40 MG capsule TAKE 1 CAPSULE BY MOUTH EVERY DAY 90 capsule 2  . ondansetron (ZOFRAN) 8 MG tablet Take 1 tablet (8 mg total) by mouth every 8 (eight) hours as needed for nausea or vomiting. 60 tablet 0  . saccharomyces boulardii (FLORASTOR) 250 MG capsule Take 1 capsule (250 mg total) by mouth 2 (two) times daily. 60 capsule 0  . traZODone (DESYREL) 50 MG tablet TAKE 1 TABLET BY MOUTH EVERYDAY AT BEDTIME 90 tablet 2  . Vitamin D, Ergocalciferol, (DRISDOL) 50000 units CAPS capsule Take 1 capsule (50,000 Units total) by mouth every 7 (seven) days. 12 capsule 3  . XARELTO 20 MG TABS tablet TAKE 1 TABLET DAILY WITH SUPPER. DC LOVENOX AND START XARELTO AT THE SCHEDULED TIME AS INSTRUCTED. 30 tablet 3   No current facility-administered medications for this visit.    Facility-Administered Medications Ordered in Other Visits  Medication Dose Route Frequency Provider Last Rate Last Dose  . heparin lock flush 100 unit/mL  500 Units Intracatheter  Once [name], MD      . octreotide (SANDOSTATIN LAR) IM injection 30 mg  30 mg Intramuscular Once [name], MD      . sodium chloride flush (NS) 0.9 % injection 10 mL  10 mL Intracatheter Once [name], MD        REVIEW OF SYSTEMS:    A 10+ POINT REVIEW OF SYSTEMS WAS OBTAINED including neurology, dermatology, psychiatry, cardiac, respiratory, lymph, extremities, GI, GU, Musculoskeletal, constitutional, breasts, reproductive, HEENT.  All pertinent positives are noted in the HPI.  All others are negative.   PHYSICAL EXAMINATION: ECOG PERFORMANCE STATUS: 2 - Symptomatic, <50% confined to bed  Vitals:   09/18/18 1351  BP: 131/72  Pulse: 68  Resp: 18  Temp: 98 F (36.7 C)  SpO2: 99%   Filed Weights   09/18/18 1351  Weight: 151 lb 14.4 oz (68.9 kg)  . [name] Wt Readings from Last 3 Encounters:  09/18/18 151 lb 14.4 oz (68.9 kg)  07/26/18 149 lb (67.6 kg)  06/28/18 153 lb (69.4 kg)   GENERAL:alert, in no acute distress and comfortable SKIN: no acute rashes, no significant lesions EYES: conjunctiva are pink and non-injected, sclera anicteric OROPHARYNX: MMM, no exudates, no oropharyngeal erythema or ulceration NECK: supple, no JVD LYMPH:  no palpable lymphadenopathy in the cervical, axillary or inguinal regions LUNGS: clear to auscultation b/l with normal respiratory effort HEART: regular rate & rhythm ABDOMEN:  normoactive bowel sounds , non tender, not distended. No palpable hepatosplenomegaly.  Extremity: no pedal edema PSYCH: alert & oriented x 3 with fluent speech NEURO: no focal motor/sensory deficits   LABORATORY DATA:  I have reviewed the data as listed  . CBC Latest Ref Rng & Units 09/18/2018 08/23/2018 07/26/2018  WBC 4.0 - 10.5 K/uL 7.0 6.8 6.8  Hemoglobin 12.0 - 15.0 g/dL 11.0(L) 11.4(L) 10.8(L)  Hematocrit 36.0 - 46.0 % 35.0(L) 34.9 34.0(L)  Platelets 150 - 400 K/uL 182 174 165    . CMP Latest Ref Rng & Units 09/18/2018  08/23/2018 07/26/2018  Glucose 70 - 99 mg/dL 138(H) 97 161(H)  BUN 8 - 23 mg/dL 18 19 21   Creatinine 0.44 - 1.00 mg/dL 1.48(H) 1.28(H) 1.44(H)  Sodium 135 - 145 mmol/L 144 139 140  Potassium 3.5 - 5.1 mmol/L 3.9 5.1 4.3  Chloride 98 - 111 mmol/L 111 104 105  CO2 22 - 32 mmol/L 24 28 26   Calcium 8.9 - 10.3 mg/dL 9.6 10.0 10.2  Total Protein 6.5 - 8.1 g/dL 7.9 8.0 7.5  Total Bilirubin 0.3 - 1.2 mg/dL 0.3 0.4 0.2(L)  Alkaline Phos 38 - 126 U/L 75 79 79  AST 15 - 41 U/L 25 20 26   ALT 0 - 44 U/L 12 7 21       RADIOGRAPHIC STUDIES:  .No results found.  ASSESSMENT & PLAN:   77 y.o. female with  #1 Metastatic poorly differentiated carcinoma with likely lung primary non-small cell lung cancer.   CT of the head with and without contrast showed no evidence of metastatic disease. Patient notes much improved pain control. EGFR blood test mutation analysis negative. Patient's pain is much better controlled after radiation for the painful ilium met. CT chest abdomen pelvis 04/19/2016 shows no evidence of disease progression. Patient tolerated Nivolumab very well but was discontinued when she developed grade 2 Immune colitis. Has been off Nivolumab for >6 months  CT chest abdomen pelvis on 06/24/2016 shows no evidence of new disease or progression of metastatic disease. CT chest abdomen pelvis 09/06/2016 shows 1. Mixed interval response to therapy. 2. There is a new left ventral chest wall lesion deep to the pectoralis musculature worrisome for metastatic disease. 3. Posterior lower lobe nodular densities are identified which may reflect areas of pulmonary metastasis. 4. Interval decrease in size of destructive lesion involving the left iliac bone.  CT chest abd pelvis 12/08/2016: Cystic mass involving the left ventral chest wall has resolved in the interval. Likely was a hematoma due to trauma. Interval increase in size of pleural base mass overlying the posterior and inferior left lower lobe. There  is also a new left pleural effusion identified.  CT chest 02/01/2017: Residual irregular soft tissue thickening/volume loss and trace left pleural fluid at the base of the left hemithorax, overall improved in appearance from 12/08/2016. No measurable lesion.  CT chest 05/29/2017 shows no residual pleural based mass or significant pleural effusion in the left hemithorax. No evidence of thoracic metastatic disease. No evidence of progressive metastatic disease within the abdomen or pelvis. Mixed lytic and blastic lesion involving the left iliac bone and associated pathologic fracture are unchanged.   CT CAP 09/14/17 shows no new changes. She does have slight displacement of her fractured left iliac bone. Evidence of stable disease.   CT CAP 01/04/2018- No new or progressive metastatic disease. Stable large left iliac bone metastasis with associated chronic pathologic fracture.   CT chest/abd/pelvis done on 04/26/18 revealed Stable exam.  No new or progressive interval findings.  07/19/18 CT C/A/P revealed Stable exam.  No new or progressive interval findings. Large destructive left iliac lesion is similar to prior. Aortic Atherosclerosis and Emphysema.    PLAN: -Continue to limit salt intake with food and begin using sports compression socks for occasional ankle swelling -Trazodone for patient's sleep difficulties -2000 units Vitamin D daily -- adjust dose to maintain 25OH vit D levels of 40-60 -Discussed pt labwork today, 09/18/18; blood counts are stable  -The pt shows no clinical or lab progression of her lung cancer at this time.  -  No indication for further treatment at this time.   -Will continue Xgeva every 4 weeks  -Will see the pt back in 2 months  With labs CT CAP  #2 diarrhea-  now resolved was previously. 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 She was previously on on  Lialda, budesonide,probiotics and lomotil but not currently taking any of these. Plan -Continue Sandostatin every 4 weeks  #3 h/o diverticulitis and c dff colitis - now resolved Plan  -continue on sandostatin today and q4weeks.  -continue on Lialda  #4 DVT and PE  -continue on Xarelto - no issues with bleeding  #5 Barrett's esophagus 4cms in the distal esophagus with low and high-grade dysplasia cannot rule out an early intra-mucosal esophageal adenocarcinoma. Plan -Patient being monitored by Dr. [name] from GI. -patient notes that she does not want to f/u at [facility] for further intervention as per GI plan.   Continue monthly Sandostatin Continue monthly Xgeva CT chest abd pelvis in 7 weeks Port flush q8weeks with each clinic visit and labs RTC with Dr Irene Limbo with labs in 2 months   All questions were answered. The patient knows to call the clinic with any problems, questions or concerns.  The total time spent in the appt was 20 minutes and more than 50% was on counseling and direct patient cares.    Sullivan Lone MD Cindy Hematology/Oncology Physician [facility]  (Office): [phone] (Work cell): [phone] (Fax): [phone]  I, Baldwin [place], am acting as a scribe for Dr. Irene Limbo  .I have reviewed the above documentation for accuracy and completeness, and I agree with the above. [name] MD

## 2018-09-18 ENCOUNTER — Inpatient Hospital Stay (HOSPITAL_BASED_OUTPATIENT_CLINIC_OR_DEPARTMENT_OTHER): Payer: Medicare Other | Admitting: Hematology

## 2018-09-18 ENCOUNTER — Inpatient Hospital Stay: Payer: Medicare Other | Attending: Hematology

## 2018-09-18 ENCOUNTER — Inpatient Hospital Stay: Payer: Medicare Other

## 2018-09-18 VITALS — BP 131/72 | HR 68 | Temp 98.0°F | Resp 18 | Ht 69.0 in | Wt 151.9 lb

## 2018-09-18 DIAGNOSIS — I82401 Acute embolism and thrombosis of unspecified deep veins of right lower extremity: Secondary | ICD-10-CM | POA: Insufficient documentation

## 2018-09-18 DIAGNOSIS — C3491 Malignant neoplasm of unspecified part of right bronchus or lung: Secondary | ICD-10-CM

## 2018-09-18 DIAGNOSIS — C7951 Secondary malignant neoplasm of bone: Secondary | ICD-10-CM | POA: Insufficient documentation

## 2018-09-18 DIAGNOSIS — Z87891 Personal history of nicotine dependence: Secondary | ICD-10-CM | POA: Insufficient documentation

## 2018-09-18 DIAGNOSIS — Z95828 Presence of other vascular implants and grafts: Secondary | ICD-10-CM

## 2018-09-18 DIAGNOSIS — C3490 Malignant neoplasm of unspecified part of unspecified bronchus or lung: Secondary | ICD-10-CM

## 2018-09-18 DIAGNOSIS — Z79899 Other long term (current) drug therapy: Secondary | ICD-10-CM | POA: Insufficient documentation

## 2018-09-18 DIAGNOSIS — E559 Vitamin D deficiency, unspecified: Secondary | ICD-10-CM

## 2018-09-18 LAB — CMP (CANCER CENTER ONLY)
ALK PHOS: 75 U/L (ref 38–126)
ALT: 12 U/L (ref 0–44)
ANION GAP: 9 (ref 5–15)
AST: 25 U/L (ref 15–41)
Albumin: 3.4 g/dL — ABNORMAL LOW (ref 3.5–5.0)
BILIRUBIN TOTAL: 0.3 mg/dL (ref 0.3–1.2)
BUN: 18 mg/dL (ref 8–23)
CO2: 24 mmol/L (ref 22–32)
CREATININE: 1.48 mg/dL — AB (ref 0.44–1.00)
Calcium: 9.6 mg/dL (ref 8.9–10.3)
Chloride: 111 mmol/L (ref 98–111)
GFR, EST AFRICAN AMERICAN: 38 mL/min — AB (ref >60)
GFR, EST NON AFRICAN AMERICAN: 33 mL/min — AB (ref >60)
Glucose, Bld: 138 mg/dL — ABNORMAL HIGH (ref 70–99)
Potassium: 3.9 mmol/L (ref 3.5–5.1)
Sodium: 144 mmol/L (ref 135–145)
TOTAL PROTEIN: 7.9 g/dL (ref 6.5–8.1)

## 2018-09-18 LAB — CBC WITH DIFFERENTIAL/PLATELET
ABS IMMATURE GRANULOCYTES: 0.02 10*3/uL (ref 0.00–0.07)
BASOS ABS: 0.1 10*3/uL (ref 0.0–0.1)
BASOS PCT: 1 %
Eosinophils Absolute: 0.2 10*3/uL (ref 0.0–0.5)
Eosinophils Relative: 3 %
HCT: 35 % — ABNORMAL LOW (ref 36.0–46.0)
Hemoglobin: 11 g/dL — ABNORMAL LOW (ref 12.0–15.0)
IMMATURE GRANULOCYTES: 0 %
Lymphocytes Relative: 25 %
Lymphs Abs: 1.7 10*3/uL (ref 0.7–4.0)
MCH: 27.8 pg (ref 26.0–34.0)
MCHC: 31.4 g/dL (ref 30.0–36.0)
MCV: 88.6 fL (ref 80.0–100.0)
MONOS PCT: 5 %
Monocytes Absolute: 0.4 10*3/uL (ref 0.1–1.0)
NEUTROS ABS: 4.6 10*3/uL (ref 1.7–7.7)
NEUTROS PCT: 66 %
PLATELETS: 182 10*3/uL (ref 150–400)
RBC: 3.95 MIL/uL (ref 3.87–5.11)
RDW: 14.4 % (ref 11.5–15.5)
WBC: 7 10*3/uL (ref 4.0–10.5)
nRBC: 0 % (ref 0.0–0.2)

## 2018-09-18 LAB — RETICULOCYTES
Immature Retic Fract: 17.9 % — ABNORMAL HIGH (ref 2.3–15.9)
RBC.: 3.95 MIL/uL (ref 3.87–5.11)
RETIC COUNT ABSOLUTE: 48.2 10*3/uL (ref 19.0–186.0)
Retic Ct Pct: 1.2 % (ref 0.4–3.1)

## 2018-09-18 MED ORDER — SODIUM CHLORIDE 0.9% FLUSH
10.0000 mL | Freq: Once | INTRAVENOUS | Status: AC
  Administered 2018-09-18: 10 mL
  Filled 2018-09-18: qty 10

## 2018-09-18 MED ORDER — HEPARIN SOD (PORK) LOCK FLUSH 100 UNIT/ML IV SOLN
500.0000 [IU] | Freq: Once | INTRAVENOUS | Status: AC
  Administered 2018-09-18: 500 [IU]
  Filled 2018-09-18: qty 5

## 2018-09-18 MED ORDER — OCTREOTIDE ACETATE 30 MG IM KIT
30.0000 mg | PACK | Freq: Once | INTRAMUSCULAR | Status: AC
  Administered 2018-09-18: 30 mg via INTRAMUSCULAR

## 2018-09-18 MED ORDER — DENOSUMAB 120 MG/1.7ML ~~LOC~~ SOLN
SUBCUTANEOUS | Status: AC
  Filled 2018-09-18: qty 1.7

## 2018-09-18 MED ORDER — OCTREOTIDE ACETATE 30 MG IM KIT
PACK | INTRAMUSCULAR | Status: AC
  Filled 2018-09-18: qty 1

## 2018-09-18 MED ORDER — DENOSUMAB 120 MG/1.7ML ~~LOC~~ SOLN
120.0000 mg | Freq: Once | SUBCUTANEOUS | Status: AC
  Administered 2018-09-18: 120 mg via SUBCUTANEOUS

## 2018-09-19 ENCOUNTER — Ambulatory Visit: Payer: Medicare Other | Admitting: Hematology

## 2018-09-19 ENCOUNTER — Ambulatory Visit: Payer: Medicare Other

## 2018-09-19 ENCOUNTER — Other Ambulatory Visit: Payer: Medicare Other

## 2018-09-19 DIAGNOSIS — H25011 Cortical age-related cataract, right eye: Secondary | ICD-10-CM | POA: Diagnosis not present

## 2018-09-19 DIAGNOSIS — H2511 Age-related nuclear cataract, right eye: Secondary | ICD-10-CM | POA: Diagnosis not present

## 2018-09-19 LAB — VITAMIN D 25 HYDROXY (VIT D DEFICIENCY, FRACTURES): VIT D 25 HYDROXY: 37.8 ng/mL (ref 30.0–100.0)

## 2018-10-16 ENCOUNTER — Other Ambulatory Visit: Payer: Self-pay | Admitting: *Deleted

## 2018-10-16 DIAGNOSIS — C3490 Malignant neoplasm of unspecified part of unspecified bronchus or lung: Secondary | ICD-10-CM

## 2018-10-17 ENCOUNTER — Inpatient Hospital Stay: Payer: Medicare Other | Attending: Hematology

## 2018-10-17 ENCOUNTER — Inpatient Hospital Stay: Payer: Medicare Other

## 2018-10-17 VITALS — BP 145/66 | HR 66 | Resp 18

## 2018-10-17 DIAGNOSIS — C3491 Malignant neoplasm of unspecified part of right bronchus or lung: Secondary | ICD-10-CM | POA: Diagnosis not present

## 2018-10-17 DIAGNOSIS — C7951 Secondary malignant neoplasm of bone: Secondary | ICD-10-CM | POA: Insufficient documentation

## 2018-10-17 DIAGNOSIS — C3490 Malignant neoplasm of unspecified part of unspecified bronchus or lung: Secondary | ICD-10-CM

## 2018-10-17 DIAGNOSIS — Z95828 Presence of other vascular implants and grafts: Secondary | ICD-10-CM

## 2018-10-17 LAB — CMP (CANCER CENTER ONLY)
ALBUMIN: 3.4 g/dL — AB (ref 3.5–5.0)
ALT: 10 U/L (ref 0–44)
AST: 21 U/L (ref 15–41)
Alkaline Phosphatase: 75 U/L (ref 38–126)
Anion gap: 6 (ref 5–15)
BILIRUBIN TOTAL: 0.3 mg/dL (ref 0.3–1.2)
BUN: 15 mg/dL (ref 8–23)
CHLORIDE: 108 mmol/L (ref 98–111)
CO2: 27 mmol/L (ref 22–32)
Calcium: 8.8 mg/dL — ABNORMAL LOW (ref 8.9–10.3)
Creatinine: 1.23 mg/dL — ABNORMAL HIGH (ref 0.44–1.00)
GFR, EST NON AFRICAN AMERICAN: 41 mL/min — AB (ref >60)
GFR, Est AFR Am: 47 mL/min — ABNORMAL LOW (ref >60)
GLUCOSE: 130 mg/dL — AB (ref 70–99)
Potassium: 4.4 mmol/L (ref 3.5–5.1)
Sodium: 141 mmol/L (ref 135–145)
Total Protein: 7.7 g/dL (ref 6.5–8.1)

## 2018-10-17 LAB — CBC WITH DIFFERENTIAL (CANCER CENTER ONLY)
ABS IMMATURE GRANULOCYTES: 0.03 10*3/uL (ref 0.00–0.07)
Basophils Absolute: 0.1 10*3/uL (ref 0.0–0.1)
Basophils Relative: 1 %
EOS PCT: 3 %
Eosinophils Absolute: 0.2 10*3/uL (ref 0.0–0.5)
HEMATOCRIT: 35.7 % — AB (ref 36.0–46.0)
HEMOGLOBIN: 11.1 g/dL — AB (ref 12.0–15.0)
Immature Granulocytes: 0 %
LYMPHS PCT: 26 %
Lymphs Abs: 1.8 10*3/uL (ref 0.7–4.0)
MCH: 27.3 pg (ref 26.0–34.0)
MCHC: 31.1 g/dL (ref 30.0–36.0)
MCV: 87.9 fL (ref 80.0–100.0)
MONO ABS: 0.6 10*3/uL (ref 0.1–1.0)
MONOS PCT: 8 %
NEUTROS ABS: 4.3 10*3/uL (ref 1.7–7.7)
Neutrophils Relative %: 62 %
Platelet Count: 205 10*3/uL (ref 150–400)
RBC: 4.06 MIL/uL (ref 3.87–5.11)
RDW: 14.2 % (ref 11.5–15.5)
WBC: 7 10*3/uL (ref 4.0–10.5)
nRBC: 0 % (ref 0.0–0.2)

## 2018-10-17 MED ORDER — OCTREOTIDE ACETATE 30 MG IM KIT
30.0000 mg | PACK | Freq: Once | INTRAMUSCULAR | Status: AC
  Administered 2018-10-17: 30 mg via INTRAMUSCULAR

## 2018-10-17 MED ORDER — DENOSUMAB 120 MG/1.7ML ~~LOC~~ SOLN
SUBCUTANEOUS | Status: AC
  Filled 2018-10-17: qty 1.7

## 2018-10-17 MED ORDER — FULVESTRANT 250 MG/5ML IM SOLN
INTRAMUSCULAR | Status: AC
  Filled 2018-10-17: qty 10

## 2018-10-17 MED ORDER — HEPARIN SOD (PORK) LOCK FLUSH 100 UNIT/ML IV SOLN
500.0000 [IU] | Freq: Once | INTRAVENOUS | Status: AC
  Administered 2018-10-17: 500 [IU]
  Filled 2018-10-17: qty 5

## 2018-10-17 MED ORDER — SODIUM CHLORIDE 0.9% FLUSH
10.0000 mL | Freq: Once | INTRAVENOUS | Status: AC
  Administered 2018-10-17: 10 mL
  Filled 2018-10-17: qty 10

## 2018-10-17 MED ORDER — DENOSUMAB 120 MG/1.7ML ~~LOC~~ SOLN
120.0000 mg | Freq: Once | SUBCUTANEOUS | Status: AC
  Administered 2018-10-17: 120 mg via SUBCUTANEOUS

## 2018-10-22 NOTE — Progress Notes (Signed)
Subjective:    Patient ID: Cindy Byrd, female    DOB: [birth year], 77 y.o.   MRN: [id number]  HPI: 01/15/18 OV: Cindy Byrd is here to establish as a new pt.  She is a pleasant 77 year old female.  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   She has 120 pack year hx, last tobacco use 2016. Current tx: 1) Active surveillance 2) Xgeva 120mg  [place] q4weeks for bone metastases. 3) Sandostatin q4weeks for diarrhea - immune colitis She has CT scan every 2 months/regular follow-up with Oncology/Dr. Irene Byrd- last OV was last week.  Per pt Dr. Irene Byrd calls her condition "Stable, not remission". She reports medication compliance and denies SE.  She denies CP/dyspnea/palpiations.  She has L hip pain that limits her exercising and prolonged walking. She reports excellent support system of local friends/family and denies thoughts of harming himself/others.  04/23/18 OV: Cindy Byrd presents for CPE She reports medication compliance, denies SE She has CT scan scheduled this Thursday She reports baseline dyspnea stable Reviewed all recent labs at length, CKD stable She has been trying to increase regular movement, ie gardening   Healthcare Maintanance: PAP- Not indicated Mammogram- Not indicated Colonoscopy- Not indicated Immunizations- declined  10/24/18 OV: Cindy Byrd is here for 6 month f/u:A fib, HTN She is followed closely by Oncology She reports only drinking one 16 oz coke/day She tries to follow heart healthy diet and denies any regular exercise She reports persistent anxiety/depression r/t Lung Ca She reports previously being on Prozac and tolerating it well She denies thoughts of harming herself/others She denies tobacco/vape/ETOH use She has CT scan schedule 11/06/18 Declined CBT referral today She reports mild dyspnea "when bending over", denies CP/palpitations/dizziness/HA  Patient Care Team     Relationship Specialty Notifications Start End  Cindy Marble D, NP PCP - General Family Medicine  01/15/18   Cindy Genera, MD Consulting Physician Hematology and Oncology Admissions 06/24/15     Patient Active Problem List   Diagnosis Date Noted  . GAD (generalized anxiety disorder) 10/24/2018  . Hyperlipidemia 04/23/2018  . Chronic kidney disease 04/23/2018  . Elevated TSH 04/17/2018  . Cough 02/21/2018  . Elevated serum creatinine 02/21/2018  . Health care maintenance 01/15/2018  . Malignant neoplasm of lung ([place])   . Colitis 06/23/2017  . Left lower quadrant pain 06/23/2017  . Fracture of left iliac crest ([place]) 06/23/2017  . Metastatic lung cancer (metastasis from lung to other site), unspecified laterality ([place])   . Hypocalcemia 09/08/2016  . 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 New onset a-fib ([place]) 09/08/2016  . Chest pain   . Thrush of mouth and esophagus ([place])   . Protein-calorie malnutrition, severe ([place])   . Colitis determined by colorectal biopsy   . Bilateral pulmonary embolism ([place]) 09/07/2016  . DVT of lower extremity, bilateral ([place]) 09/07/2016  . Palliative care by specialist   . Goals of care, counseling/discussion   . Advance care planning   . Diarrhea   . Ulcerative pancolitis without complication ([place])   . Cancer ([place])   . Pressure injury of skin 09/02/2016  . HCAP (healthcare-associated pneumonia) 09/01/2016  . ARF (acute renal failure) ([place]) 09/01/2016  . Nausea with vomiting 07/18/2016  . Dehydration 07/18/2016  . Hypokalemia 07/18/2016  . Hypoalbuminemia due to protein-calorie malnutrition ([place]) 07/18/2016  . Peripheral edema 07/18/2016  . Diarrhea due to drug 05/19/2016  . Port catheter  in place 04/07/2016  . Nicotine addiction 01/19/2016  . Barrett's esophagus determined by biopsy 09/04/2015  . Gastritis   . Primary malignant neoplasm of lung metastatic to other site ([place]) 08/06/2015  . Esophageal reflux 08/04/2015  . Bone metastasis  ([place]) 06/24/2015  . Neoplasm related pain 06/24/2015  . Protein calorie malnutrition ([place]) 06/24/2015  . Anorexia 06/24/2015  . Heavy smoker (more than 20 cigarettes per day) 06/24/2015  . Depression 06/24/2015  . HTN (hypertension) 06/24/2015     Past Medical History:  Diagnosis Date  . Barrett's esophagus   . Bone neoplasm 06/24/2015  . Cancer [facility])    metastatic poorly differentiated carcinoma. tumor left groin surgical removal with radiation tx.  . Cataract    BILATERAL  . Cigarette smoker two packs a day or less    Currently still smoking 2 PPD - Not interested in quitting at this time.  . Colitis 2017  . Colon polyps    hyperplastic, tubular adenomas, tubulovillous adenoma  . Cough, persistent    hx. lung cancer ? primary-being evaluated, unsure of primary site.  . Depression 06/24/2015  . Diverticulosis   . Emphysema of lung ([place])   . Endometriosis    Hysterectomy with BSO at age 35 yrs  . Esophageal adenocarcinoma ([place]) 08/11/15   intramucosal  . Gastritis   . GERD (gastroesophageal reflux disease)   . H/O: pneumonia   . Hiatal hernia   . Hyperlipidemia   . Hypertension 06/24/2015   likely improved incidental to 40 lbs weight loss from her neoplasm. No Longer taking med for this as of 08-06-15  . IBS (irritable bowel syndrome)   . Pain    left hip-persistent"tumor of bone"-radiation tx. 10.  . Vitamin Byrd deficiency disease      Past Surgical History:  Procedure Laterality Date  . BARTHOLIN GLAND CYST EXCISION  77 yo ago   Does not want if it was an infected cyst or tumor. Was soon as delivery  . CATARACT EXTRACTION    . COLONOSCOPY W/ POLYPECTOMY     multiple times - last done 09/2014 per patient.  . ESOPHAGOGASTRODUODENOSCOPY (EGD) WITH PROPOFOL N/A 08/11/2015   Procedure: ESOPHAGOGASTRODUODENOSCOPY (EGD) WITH PROPOFOL;  Surgeon: [name], MD;  Location: [facility];  Service: Gastroenterology;  Laterality: N/A;  . FLEXIBLE SIGMOIDOSCOPY N/A 06/24/2017    Procedure: FLEXIBLE SIGMOIDOSCOPY;  Surgeon: Manus Gunning, MD;  Location: [facility]OS[facility];  Service: Gastroenterology;  Laterality: N/A;  . GANGLION CYST EXCISION    . KNEE ARTHROSCOPY  age about 71 yrs  . TONSILLECTOMY    . TOTAL ABDOMINAL HYSTERECTOMY W/ BILATERAL SALPINGOOPHORECTOMY  at age 33 yrs   For endometriosis     Family History  Problem Relation Age of Onset  . Colon cancer Brother   . Colon cancer Brother   . Stroke Mother   . Colon cancer Father   . Breast cancer Daughter 29       ER/PR+ stage II     Social History   Substance and Sexual Activity  Drug Use No     Social History   Substance and Sexual Activity  Alcohol Use No  . Alcohol/week: 0.0 standard drinks     Social History   Tobacco Use  Smoking Status Former Smoker  . Packs/day: 1.00  . Years: 60.00  . Pack years: 60.00  . Types: Cigarettes  . Last attempt to quit: 12/06/2015  . Years since quitting: 2.8  Smokeless Tobacco Never Used  Outpatient Encounter Medications as of 10/24/2018  Medication Sig  . amLODipine (NORVASC) 10 MG tablet Take 1 tablet (10 mg total) by mouth daily.  . calcium-vitamin Byrd (OSCAL WITH Byrd) 500-200 MG-UNIT tablet Take 2 tablets by mouth 3 (three) times daily.  [name] lidocaine-prilocaine (EMLA) cream Apply small amount over port 1-2 hours prior to treatment, cover with plastic wrap (DO NOT RUB IN).  . LORazepam (ATIVAN) 1 MG tablet Take 1 tablet (1 mg total) by mouth every 8 (eight) hours as needed for anxiety (or nausea).  [name] omeprazole (PRILOSEC) 40 MG capsule TAKE 1 CAPSULE BY MOUTH EVERY DAY  . saccharomyces boulardii (FLORASTOR) 250 MG capsule Take 1 capsule (250 mg total) by mouth 2 (two) times daily.  . traZODone (DESYREL) 50 MG tablet TAKE 1 TABLET BY MOUTH EVERYDAY AT BEDTIME  . Vitamin Byrd, Ergocalciferol, (DRISDOL) 50000 units CAPS capsule Take 1 capsule (50,000 Units total) by mouth every 7 (seven) days.  Alveda Reasons 20 MG TABS tablet TAKE 1 TABLET  DAILY WITH SUPPER. DC LOVENOX AND START XARELTO AT THE SCHEDULED TIME AS INSTRUCTED.  [name] FLUoxetine (PROZAC) 10 MG tablet Take 1 tablet (10 mg total) by mouth daily.  . [DISCONTINUED] ondansetron (ZOFRAN) 8 MG tablet Take 1 tablet (8 mg total) by mouth every 8 (eight) hours as needed for nausea or vomiting.   No facility-administered encounter medications on file as of 10/24/2018.     Allergies: Penicillins; Remeron [mirtazapine]; and Latex  Body mass index is 22.48 kg/m.  Blood pressure 116/66, pulse 85, temperature 98.7 F (37.1 C), temperature source Oral, height 5\' 9"  (1.753 m), weight 152 lb 3.2 oz (69 kg), SpO2 95 %.  Review of Systems  Constitutional: Positive for fatigue. Negative for activity change, appetite change, chills, diaphoresis, fever and unexpected weight change.  HENT: Negative for congestion.   Eyes: Negative for visual disturbance.  Respiratory: Positive for shortness of breath. Negative for cough, chest tightness, wheezing and stridor.   Cardiovascular: Negative for chest pain, palpitations and leg swelling.  Gastrointestinal: Negative for abdominal distention, abdominal pain, blood in stool, constipation, diarrhea, nausea and vomiting.  Genitourinary: Negative for difficulty urinating and flank pain.  Musculoskeletal: Positive for arthralgias, gait problem and myalgias. Negative for back pain, joint swelling, neck pain and neck stiffness.  Skin: Negative for color change, pallor, rash and wound.  Neurological: Negative for dizziness and headaches.  Hematological: Does not bruise/bleed easily.  Psychiatric/Behavioral: Positive for dysphoric mood. Negative for agitation, behavioral problems, confusion, decreased concentration, hallucinations, self-injury, sleep disturbance and suicidal ideas. The patient is nervous/anxious. The patient is not hyperactive.        Objective:   Physical Exam  Constitutional: She is oriented to person, place, and time. She appears  well-developed and well-nourished. No distress.  HENT:  Head: Normocephalic and atraumatic.  Right Ear: External ear normal. Tympanic membrane is not erythematous and not bulging. No decreased hearing is noted.  Left Ear: External ear normal. Tympanic membrane is not erythematous and not bulging. No decreased hearing is noted.  Nose: Nose normal. No mucosal edema or rhinorrhea. Right sinus exhibits no maxillary sinus tenderness and no frontal sinus tenderness. Left sinus exhibits no maxillary sinus tenderness and no frontal sinus tenderness.  Mouth/Throat: Oropharynx is clear and moist and mucous membranes are normal. Abnormal dentition. No dental caries.  Eyes: Pupils are equal, round, and reactive to light. Conjunctivae and EOM are normal.  Cardiovascular: Normal rate, regular rhythm, normal heart sounds and intact distal pulses.  No  murmur heard. Pulmonary/Chest: Effort normal. No respiratory distress. She has no decreased breath sounds. She has no wheezes. She has no rhonchi. She has no rales. She exhibits no tenderness. Right breast exhibits no inverted nipple, no mass, no nipple discharge, no skin change and no tenderness. Left breast exhibits no inverted nipple, no mass, no nipple discharge, no skin change and no tenderness.  R anterior chest port-a-cath  Abdominal: Soft. Bowel sounds are normal. She exhibits no distension and no mass. There is no tenderness. There is no rebound and no guarding.  Musculoskeletal: Normal range of motion.       Right ankle: She exhibits swelling.       Left ankle: She exhibits swelling.  Neurological: She is alert and oriented to person, place, and time. Coordination normal.  Skin: Skin is warm and dry. No rash noted. She is not diaphoretic. No erythema. No pallor.  Psychiatric: She has a normal mood and affect. Her behavior is normal. Judgment and thought content normal.  Nursing note and vitals reviewed.     Assessment & Plan:   1. Elevated serum  creatinine   2. Essential hypertension   3. Health care maintenance   4. Depression, unspecified depression type   5. GAD (generalized anxiety disorder)     Health care maintenance Continue all medications as directed, with one new addition- Start once daily Fluoxetine (Prozac) 10mg . Increase water intake and follow heart healthy diet. Walk to mailbox and back twice daily. We will call you when lab results are available. Follow-up in 4 weeks to evaluate effectiveness of Fluoxetine (Prozac).  Depression Start once daily Fluoxetine (Prozac) 10mg . Follow-up in 4 weeks to evaluate effectiveness of Fluoxetine (Prozac).  GAD (generalized anxiety disorder) Start once daily Fluoxetine (Prozac) 10mg . Follow-up in 4 weeks to evaluate effectiveness of Fluoxetine (Prozac). Declined CBT referral  Elevated serum creatinine CMP drawn today  HTN (hypertension) BP at goal 116/66, HR 85 Continue Amlodipine 10mg  QD    FOLLOW-UP:  Return in about 4 weeks (around 11/21/2018) for Depression, Anxiety, Evaluate Medication Effectiveness.

## 2018-10-24 ENCOUNTER — Ambulatory Visit (INDEPENDENT_AMBULATORY_CARE_PROVIDER_SITE_OTHER): Payer: Medicare Other | Admitting: Adult Health

## 2018-10-24 ENCOUNTER — Encounter: Payer: Self-pay | Admitting: Adult Health

## 2018-10-24 ENCOUNTER — Telehealth: Payer: Self-pay

## 2018-10-24 VITALS — BP 116/66 | HR 85 | Temp 98.7°F | Ht 69.0 in | Wt 152.2 lb

## 2018-10-24 DIAGNOSIS — F411 Generalized anxiety disorder: Secondary | ICD-10-CM

## 2018-10-24 DIAGNOSIS — R7989 Other specified abnormal findings of blood chemistry: Secondary | ICD-10-CM

## 2018-10-24 DIAGNOSIS — F329 Major depressive disorder, single episode, unspecified: Secondary | ICD-10-CM

## 2018-10-24 DIAGNOSIS — Z0000 Encounter for general adult medical examination without abnormal findings: Secondary | ICD-10-CM | POA: Diagnosis not present

## 2018-10-24 DIAGNOSIS — I10 Essential (primary) hypertension: Secondary | ICD-10-CM

## 2018-10-24 DIAGNOSIS — F32A Depression, unspecified: Secondary | ICD-10-CM

## 2018-10-24 MED ORDER — FLUOXETINE HCL 10 MG PO TABS: 10.0000 mg | ORAL_TABLET | Freq: Every day | ORAL | 1 refills | Status: DC

## 2018-10-24 NOTE — Assessment & Plan Note (Signed)
Start once daily Fluoxetine (Prozac) 10mg . Follow-up in 4 weeks to evaluate effectiveness of Fluoxetine (Prozac). Declined CBT referral

## 2018-10-24 NOTE — Assessment & Plan Note (Signed)
Start once daily Fluoxetine (Prozac) 10mg . Follow-up in 4 weeks to evaluate effectiveness of Fluoxetine (Prozac).

## 2018-10-24 NOTE — Assessment & Plan Note (Signed)
CMP drawn today

## 2018-10-24 NOTE — Patient Instructions (Signed)
Generalized Anxiety Disorder, Adult Generalized anxiety disorder (GAD) is a mental health disorder. People with this condition constantly worry about everyday events. Unlike normal anxiety, worry related to GAD is not triggered by a specific event. These worries also do not fade or get better with time. GAD interferes with life functions, including relationships, work, and school. GAD can vary from mild to severe. People with severe GAD can have intense waves of anxiety with physical symptoms (panic attacks). What are the causes? The exact cause of GAD is not known. What increases the risk? This condition is more likely to develop in:  Women.  People who have a family history of anxiety disorders.  People who are very shy.  People who experience very stressful life events, such as the death of a loved one.  People who have a very stressful family environment.  What are the signs or symptoms? People with GAD often worry excessively about many things in their lives, such as their health and family. They may also be overly concerned about:  Doing well at work.  Being on time.  Natural disasters.  Friendships.  Physical symptoms of GAD include:  Fatigue.  Muscle tension or having muscle twitches.  Trembling or feeling shaky.  Being easily startled.  Feeling like your heart is pounding or racing.  Feeling out of breath or like you cannot take a deep breath.  Having trouble falling asleep or staying asleep.  Sweating.  Nausea, diarrhea, or irritable bowel syndrome (IBS).  Headaches.  Trouble concentrating or remembering facts.  Restlessness.  Irritability.  How is this diagnosed? Your health care provider can diagnose GAD based on your symptoms and medical history. You will also have a physical exam. The health care provider will ask specific questions about your symptoms, including how severe they are, when they started, and if they come and go. Your health care  provider may ask you about your use of alcohol or drugs, including prescription medicines. Your health care provider may refer you to a mental health specialist for further evaluation. Your health care provider will do a thorough examination and may perform additional tests to rule out other possible causes of your symptoms. To be diagnosed with GAD, a person must have anxiety that:  Is out of his or her control.  Affects several different aspects of his or her life, such as work and relationships.  Causes distress that makes him or her unable to take part in normal activities.  Includes at least three physical symptoms of GAD, such as restlessness, fatigue, trouble concentrating, irritability, muscle tension, or sleep problems.  Before your health care provider can confirm a diagnosis of GAD, these symptoms must be present more days than they are not, and they must last for six months or longer. How is this treated? The following therapies are usually used to treat GAD:  Medicine. Antidepressant medicine is usually prescribed for long-term daily control. Antianxiety medicines may be added in severe cases, especially when panic attacks occur.  Talk therapy (psychotherapy). Certain types of talk therapy can be helpful in treating GAD by providing support, education, and guidance. Options include: ? Cognitive behavioral therapy (CBT). People learn coping skills and techniques to ease their anxiety. They learn to identify unrealistic or negative thoughts and behaviors and to replace them with positive ones. ? Acceptance and commitment therapy (ACT). This treatment teaches people how to be mindful as a way to cope with unwanted thoughts and feelings. ? Biofeedback. This process trains you to   manage your body's response (physiological response) through breathing techniques and relaxation methods. You will work with a therapist while machines are used to monitor your physical symptoms.  Stress  management techniques. These include yoga, meditation, and exercise.  A mental health specialist can help determine which treatment is best for you. Some people see improvement with one type of therapy. However, other people require a combination of therapies. Follow these instructions at home:  Take over-the-counter and prescription medicines only as told by your health care provider.  Try to maintain a normal routine.  Try to anticipate stressful situations and allow extra time to manage them.  Practice any stress management or self-calming techniques as taught by your health care provider.  Do not punish yourself for setbacks or for not making progress.  Try to recognize your accomplishments, even if they are small.  Keep all follow-up visits as told by your health care provider. This is important. Contact a health care provider if:  Your symptoms do not get better.  Your symptoms get worse.  You have signs of depression, such as: ? A persistently sad, cranky, or irritable mood. ? Loss of enjoyment in activities that used to bring you joy. ? Change in weight or eating. ? Changes in sleeping habits. ? Avoiding friends or family members. ? Loss of energy for normal tasks. ? Feelings of guilt or worthlessness. Get help right away if:  You have serious thoughts about hurting yourself or others. If you ever feel like you may hurt yourself or others, or have thoughts about taking your own life, get help right away. You can go to your nearest emergency department or call:  Your local emergency services (911 in the [place]  A suicide crisis helpline, such as the [place] at 667-425-1788. This is open 24 hours a day.  Summary  Generalized anxiety disorder (GAD) is a mental health disorder that involves worry that is not triggered by a specific event.  People with GAD often worry excessively about many things in their lives, such as their health and  family.  GAD may cause physical symptoms such as restlessness, trouble concentrating, sleep problems, frequent sweating, nausea, diarrhea, headaches, and trembling or muscle twitching.  A mental health specialist can help determine which treatment is best for you. Some people see improvement with one type of therapy. However, other people require a combination of therapies. This information is not intended to replace advice given to you by your health care provider. Make sure you discuss any questions you have with your health care provider. Document Released: 03/18/2013 Document Revised: 10/11/2016 Document Reviewed: 10/11/2016 Elsevier Interactive Patient Education  2018 [employer].   Persistent Depressive Disorder Persistent depressive disorder (PDD) is a mental health condition. PDD causes symptoms of low-level depression for 2 years or longer. It may also be called long-term (chronic) depression or dysthymia. PDD may include episodes of more severe depression that last for about 2 weeks (major depressive disorder or MDD). PDD can affect the way you think, feel, and sleep. This condition may also affect your relationships. You may be more likely to get sick if you have PDD. Symptoms of PDD occur for most of the day and may include:  Feeling tired (fatigue).  Low energy.  Eating too much or too little.  Sleeping too much or too little.  Feeling restless or agitated.  Feeling hopeless.  Feeling worthless or guilty.  Feeling worried or nervous (anxiety).  Trouble concentrating or making decisions.  Low self-esteem.  A negative way of looking at things (outlook).  Not being able to have fun or feel pleasure.  Avoiding interacting with people.  Getting angry or annoyed easily (irritability).  Acting aggressive or angry.  Follow these instructions at home: Activity  Go back to your normal activities as told by your doctor.  Exercise regularly as told by your  doctor. General instructions  Take over-the-counter and prescription medicines only as told by your doctor.  Do not drink alcohol. Or, limit how much alcohol you drink to no more than 1 drink a day for nonpregnant women and 2 drinks a day for men. One drink equals 12 oz of beer, 5 oz of wine, or 1 oz of hard liquor. Alcohol can affect any antidepressant medicines you are taking. Talk with your doctor about your alcohol use.  Eat a healthy diet and get plenty of sleep.  Find activities that you enjoy each day.  Consider joining a support group. Your doctor may be able to suggest a support group.  Keep all follow-up visits as told by your doctor. This is important. Where to find more information: [employer] on Mental Illness  www.nami.org  [place]. National Institute of Mental Health  https://carter.com/  National Suicide Prevention Lifeline  850 608 6388 (713)443-8529). This is free, 24-hour help.  Contact a doctor if:  Your symptoms get worse.  You have new symptoms.  You have trouble sleeping or doing your daily activities. Get help right away if:  You self-harm.  You have serious thoughts about hurting yourself or others.  You see, hear, taste, smell, or feel things that are not there (hallucinate). This information is not intended to replace advice given to you by your health care provider. Make sure you discuss any questions you have with your health care provider. Document Released: 11/02/2015 Document Revised: 07/15/2016 Document Reviewed: 07/15/2016 Elsevier Interactive Patient Education  2017 [place] all medications as directed, with one new addition- Start once daily Fluoxetine (Prozac) 10mg . Increase water intake and follow heart healthy diet. Walk to mailbox and back twice daily. We will call you when lab results are available. Follow-up in 4 weeks to evaluate effectiveness of Fluoxetine (Prozac). Enjoy Thanksgiving!  NICE TO SEE  YOU!

## 2018-10-24 NOTE — Assessment & Plan Note (Signed)
Continue all medications as directed, with one new addition- Start once daily Fluoxetine (Prozac) 10mg . Increase water intake and follow heart healthy diet. Walk to mailbox and back twice daily. We will call you when lab results are available. Follow-up in 4 weeks to evaluate effectiveness of Fluoxetine (Prozac).

## 2018-10-24 NOTE — Telephone Encounter (Signed)
Received fax from [facility] stating that fluoxetine is non-formulary.  Preferred medications are citalopram and escitalopram.  Do you wish to change therapy or would you like me to attempt PA?  [name], CMA

## 2018-10-24 NOTE — Telephone Encounter (Signed)
Please try PA She has previously been on Fluoxetine in past, tolerated well and I would like to continue a known proven therapy for her. Thanks! Valetta Fuller

## 2018-10-24 NOTE — Assessment & Plan Note (Signed)
BP at goal 116/66, HR 85 Continue Amlodipine 10mg  QD

## 2018-10-25 LAB — COMPREHENSIVE METABOLIC PANEL
ALBUMIN: 4 g/dL (ref 3.5–4.8)
ALT: 11 IU/L (ref 0–32)
AST: 18 IU/L (ref 0–40)
Albumin/Globulin Ratio: 1.2 (ref 1.2–2.2)
Alkaline Phosphatase: 76 IU/L (ref 39–117)
BUN / CREAT RATIO: 16 (ref 12–28)
BUN: 18 mg/dL (ref 8–27)
Bilirubin Total: 0.2 mg/dL (ref 0.0–1.2)
CALCIUM: 9 mg/dL (ref 8.7–10.3)
CO2: 20 mmol/L (ref 20–29)
CREATININE: 1.16 mg/dL — AB (ref 0.57–1.00)
Chloride: 110 mmol/L — ABNORMAL HIGH (ref 96–106)
GFR calc Af Amer: 52 mL/min/{1.73_m2} — ABNORMAL LOW (ref >59)
GFR, EST NON AFRICAN AMERICAN: 45 mL/min/{1.73_m2} — AB (ref >59)
Globulin, Total: 3.4 g/dL (ref 1.5–4.5)
Glucose: 153 mg/dL — ABNORMAL HIGH (ref 65–99)
Potassium: 4.6 mmol/L (ref 3.5–5.2)
SODIUM: 143 mmol/L (ref 134–144)
TOTAL PROTEIN: 7.4 g/dL (ref 6.0–8.5)

## 2018-10-25 NOTE — Telephone Encounter (Signed)
PA submitted.  Awaiting [facility]'s decision.  [name], CMA

## 2018-10-26 NOTE — Telephone Encounter (Signed)
Received response from [facility] stating that PA was not required for fluoxetine, that it is a covered medication on the pt's insurance plan.  Pt informed.  [name], CMA

## 2018-11-06 ENCOUNTER — Other Ambulatory Visit: Payer: Self-pay | Admitting: Adult Health

## 2018-11-06 ENCOUNTER — Encounter (HOSPITAL_COMMUNITY): Payer: Self-pay

## 2018-11-06 ENCOUNTER — Ambulatory Visit (HOSPITAL_COMMUNITY)
Admission: RE | Admit: 2018-11-06 | Discharge: 2018-11-06 | Disposition: A | Discharge status: Home / Self Care | Payer: Medicare Other | Source: Ambulatory Visit | Attending: Hematology | Admitting: Hematology

## 2018-11-06 DIAGNOSIS — J432 Centrilobular emphysema: Secondary | ICD-10-CM | POA: Diagnosis not present

## 2018-11-06 DIAGNOSIS — C3490 Malignant neoplasm of unspecified part of unspecified bronchus or lung: Secondary | ICD-10-CM | POA: Diagnosis not present

## 2018-11-06 DIAGNOSIS — K5730 Diverticulosis of large intestine without perforation or abscess without bleeding: Secondary | ICD-10-CM | POA: Diagnosis not present

## 2018-11-12 ENCOUNTER — Other Ambulatory Visit: Payer: Self-pay | Admitting: Hematology

## 2018-11-14 ENCOUNTER — Inpatient Hospital Stay: Payer: Medicare Other

## 2018-11-14 ENCOUNTER — Other Ambulatory Visit: Payer: Medicare Other

## 2018-11-14 ENCOUNTER — Ambulatory Visit: Payer: Medicare Other

## 2018-11-14 ENCOUNTER — Inpatient Hospital Stay: Payer: Medicare Other | Attending: Hematology

## 2018-11-14 ENCOUNTER — Inpatient Hospital Stay (HOSPITAL_BASED_OUTPATIENT_CLINIC_OR_DEPARTMENT_OTHER): Payer: Medicare Other | Admitting: Hematology

## 2018-11-14 VITALS — BP 149/68 | HR 87 | Temp 98.3°F | Resp 18 | Ht 69.0 in | Wt 154.3 lb

## 2018-11-14 DIAGNOSIS — K228 Other specified diseases of esophagus: Secondary | ICD-10-CM | POA: Diagnosis not present

## 2018-11-14 DIAGNOSIS — C7951 Secondary malignant neoplasm of bone: Secondary | ICD-10-CM | POA: Diagnosis not present

## 2018-11-14 DIAGNOSIS — C3490 Malignant neoplasm of unspecified part of unspecified bronchus or lung: Secondary | ICD-10-CM

## 2018-11-14 DIAGNOSIS — I82401 Acute embolism and thrombosis of unspecified deep veins of right lower extremity: Secondary | ICD-10-CM

## 2018-11-14 DIAGNOSIS — Z95828 Presence of other vascular implants and grafts: Secondary | ICD-10-CM

## 2018-11-14 DIAGNOSIS — C3491 Malignant neoplasm of unspecified part of right bronchus or lung: Secondary | ICD-10-CM | POA: Insufficient documentation

## 2018-11-14 DIAGNOSIS — Z87891 Personal history of nicotine dependence: Secondary | ICD-10-CM

## 2018-11-14 DIAGNOSIS — K2289 Other specified disease of esophagus: Secondary | ICD-10-CM

## 2018-11-14 LAB — CMP (CANCER CENTER ONLY)
ALT: 16 U/L (ref 0–44)
AST: 27 U/L (ref 15–41)
Albumin: 3.3 g/dL — ABNORMAL LOW (ref 3.5–5.0)
Alkaline Phosphatase: 72 U/L (ref 38–126)
Anion gap: 8 (ref 5–15)
BUN: 19 mg/dL (ref 8–23)
CALCIUM: 9.4 mg/dL (ref 8.9–10.3)
CO2: 22 mmol/L (ref 22–32)
Chloride: 109 mmol/L (ref 98–111)
Creatinine: 1.17 mg/dL — ABNORMAL HIGH (ref 0.44–1.00)
GFR, EST AFRICAN AMERICAN: 52 mL/min — AB (ref >60)
GFR, Estimated: 45 mL/min — ABNORMAL LOW (ref >60)
Glucose, Bld: 136 mg/dL — ABNORMAL HIGH (ref 70–99)
Potassium: 4.3 mmol/L (ref 3.5–5.1)
SODIUM: 139 mmol/L (ref 135–145)
TOTAL PROTEIN: 7.8 g/dL (ref 6.5–8.1)
Total Bilirubin: 0.4 mg/dL (ref 0.3–1.2)

## 2018-11-14 LAB — RETICULOCYTES
Immature Retic Fract: 16.2 % — ABNORMAL HIGH (ref 2.3–15.9)
RBC.: 3.93 MIL/uL (ref 3.87–5.11)
Retic Count, Absolute: 53.8 10*3/uL (ref 19.0–186.0)
Retic Ct Pct: 1.4 % (ref 0.4–3.1)

## 2018-11-14 LAB — CBC WITH DIFFERENTIAL (CANCER CENTER ONLY)
ABS IMMATURE GRANULOCYTES: 0.02 10*3/uL (ref 0.00–0.07)
Basophils Absolute: 0.1 10*3/uL (ref 0.0–0.1)
Basophils Relative: 1 %
Eosinophils Absolute: 0.2 10*3/uL (ref 0.0–0.5)
Eosinophils Relative: 3 %
HCT: 33.6 % — ABNORMAL LOW (ref 36.0–46.0)
Hemoglobin: 10.8 g/dL — ABNORMAL LOW (ref 12.0–15.0)
Immature Granulocytes: 0 %
LYMPHS ABS: 1.6 10*3/uL (ref 0.7–4.0)
Lymphocytes Relative: 23 %
MCH: 27.5 pg (ref 26.0–34.0)
MCHC: 32.1 g/dL (ref 30.0–36.0)
MCV: 85.5 fL (ref 80.0–100.0)
Monocytes Absolute: 0.5 10*3/uL (ref 0.1–1.0)
Monocytes Relative: 7 %
Neutro Abs: 4.8 10*3/uL (ref 1.7–7.7)
Neutrophils Relative %: 66 %
Platelet Count: 194 10*3/uL (ref 150–400)
RBC: 3.93 MIL/uL (ref 3.87–5.11)
RDW: 14.9 % (ref 11.5–15.5)
WBC Count: 7.2 10*3/uL (ref 4.0–10.5)
nRBC: 0 % (ref 0.0–0.2)

## 2018-11-14 LAB — MAGNESIUM: Magnesium: 1.6 mg/dL — ABNORMAL LOW (ref 1.7–2.4)

## 2018-11-14 MED ORDER — OCTREOTIDE ACETATE 30 MG IM KIT
PACK | INTRAMUSCULAR | Status: AC
  Filled 2018-11-14: qty 1

## 2018-11-14 MED ORDER — HEPARIN SOD (PORK) LOCK FLUSH 100 UNIT/ML IV SOLN
500.0000 [IU] | Freq: Once | INTRAVENOUS | Status: AC
  Administered 2018-11-14: 500 [IU] via INTRAVENOUS
  Filled 2018-11-14: qty 5

## 2018-11-14 MED ORDER — DENOSUMAB 120 MG/1.7ML ~~LOC~~ SOLN
SUBCUTANEOUS | Status: AC
  Filled 2018-11-14: qty 1.7

## 2018-11-14 MED ORDER — OCTREOTIDE ACETATE 30 MG IM KIT
30.0000 mg | PACK | Freq: Once | INTRAMUSCULAR | Status: AC
  Administered 2018-11-14: 30 mg via INTRAMUSCULAR

## 2018-11-14 MED ORDER — SODIUM CHLORIDE 0.9% FLUSH
10.0000 mL | INTRAVENOUS | Status: DC | PRN
  Administered 2018-11-14: 10 mL via INTRAVENOUS
  Filled 2018-11-14: qty 10

## 2018-11-14 MED ORDER — DENOSUMAB 120 MG/1.7ML ~~LOC~~ SOLN: 120.0000 mg | Freq: Once | SUBCUTANEOUS | Status: DC

## 2018-11-14 NOTE — Progress Notes (Signed)
MD confirmed w/ pharmacy on 10/17/18 that pt is receiving Xgeva q2 mos since she has completed 2 yrs of tx.  No Xgeva today since last given 10/17/18. Kennith Center, Pharm.D., CPP 11/14/2018@1 :13 PM

## 2018-11-14 NOTE — Patient Instructions (Addendum)

## 2018-11-14 NOTE — Progress Notes (Signed)
Cindy Byrd    HEMATOLOGY ONCOLOGY PROGRESS NOTE  Date of service:  11/14/18     Patient Care Team: [name], NP as PCP - General (Family Medicine) [name], MD as Consulting Physician (Hematology and Oncology)  CHIEF COMPLAINTS/PURPOSE OF CONSULTATION:  F/u for metastatic lung cancer  DIAGNOSIS:   #1 Metastatic non-small cell lung cancer with bilateral lung nodules and large metastatic lesion in the left Ilium. #2 Barrett's esophagus with some evidence of intramucosal adenocarcinoma of the esophagus. 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 Also had c diff colitis - treated   Current Treatment  1) Active surveillance 2) Xgeva 137m [place] q4weeks for bone metastases. 3) Sandostatin q4weeks for diarrhea - immune colitis  Previous Treatment  1 Palliative radiation therapy to the large left ilium metastases 2. IV Nivolumab x 20ycles (discontinued due to likely immune colitis) 3. Xgeva 1216mSC q4weeks for bone metastases.   HISTORY OF PRESENTING ILLNESS: (plz see my previous consultation for details of initial presentation)  INTERVAL HISTORY:   Cindy Byrd here for her scheduled follow-up for metastatic lung cancer. The patient's last visit with [place] on 09/18/18. The pt reports that she is doing well overall.   The pt notes that she has experienced some different symptoms that feel "like heartburn, but not like heartburn," and finds it difficult to characterize the change she's sensing. Denies problems swallowing or a sensation of food getting stuck. She has not seen Dr. JaZenovia Jarredn GI recently regarding her Barrett's esophagus.   The pt reports that she has been eating very well and notes that she has gained a couple pounds in the interim. She also endorses good energy levels and continues doing all she wishes to do in life.   The pt notes that she continues moving her bowels  well and is overall much improved sense beginning Sandostatin. She occasionally has loose stools for which she uses Imodium as needed.   The pt notes that she continues breathing well and denies any SOB or CP. She continues in her absolute smoking cessation, which will be three years in March 2020.   Of note since the patient's last visit, pt has had a CT C/A/P completed on 11/06/18 with results revealing Similar appearance of large mixed lytic and sclerotic lesion in the left ilium. No new metastatic lesions are otherwise noted elsewhere in the chest, abdomen or pelvis. 2. Interval development of thickening of the distal third of the esophagus. This is nonspecific, and could be related to underlying reflux esophagitis. However, if there is any clinical concern for Barrett's metaplasia or esophageal neoplasia, further evaluation with nonemergent endoscopy could be considered. 3. Aortic atherosclerosis, in addition to left main coronary artery disease. Assessment for potential risk factor modification, dietary therapy or pharmacologic therapy may be warranted, if clinically Indicated. 4. Diffuse bronchial wall thickening with mild to moderate centrilobular and paraseptal emphysema; imaging findings suggestive of underlying COPD. 5. Additional incidental findings, as above.  Lab results today (11/14/18) of CBC w/diff and CMP is as follows: all values are WNL except for HGB at 10.8, HCT at 33.6, Glucose at 136, Creatinine at 1.17, Albumin at 3.3, GFR at 45.  On review of systems, pt reports good energy levels, eating well, mild weight gain, heartburn-like symptoms, moving her bowels well, breathing well, and denies problems swallowing, back pain, and any other symptoms.   MEDICAL HISTORY:  Past Medical History:  Diagnosis Date  . Barrett's esophagus   . Bone neoplasm 06/24/2015  . Cancer [facility])    metastatic poorly differentiated carcinoma. tumor left groin surgical removal with radiation tx.  . Cataract     BILATERAL  . Cigarette smoker two packs a day or less    Currently still smoking 2 PPD - Not interested in quitting at this time.  . Colitis 2017  . Colon polyps    hyperplastic, tubular adenomas, tubulovillous adenoma  . Cough, persistent    hx. lung cancer ? primary-being evaluated, unsure of primary site.  . Depression 06/24/2015  . Diverticulosis   . Emphysema of lung ([place])   . Endometriosis    Hysterectomy with BSO at age 21 yrs  . Esophageal adenocarcinoma ([place]) 08/11/15   intramucosal  . Gastritis   . GERD (gastroesophageal reflux disease)   . H/O: pneumonia   . Hiatal hernia   . Hyperlipidemia   . Hypertension 06/24/2015   likely improved incidental to 40 lbs weight loss from her neoplasm. No Longer taking med for this as of 08-06-15  . IBS (irritable bowel syndrome)   . Pain    left hip-persistent"tumor of bone"-radiation tx. 10.  . Vitamin D deficiency disease    SURGICAL HISTORY: Past Surgical History:  Procedure Laterality Date  . BARTHOLIN GLAND CYST EXCISION  77 yo ago   Does not want if it was an infected cyst or tumor. Was soon as delivery  . CATARACT EXTRACTION    . COLONOSCOPY W/ POLYPECTOMY     multiple times - last done 09/2014 per patient.  . ESOPHAGOGASTRODUODENOSCOPY (EGD) WITH PROPOFOL N/A 08/11/2015   Procedure: ESOPHAGOGASTRODUODENOSCOPY (EGD) WITH PROPOFOL;  Surgeon: [name], MD;  Location: [facility];  Service: Gastroenterology;  Laterality: N/A;  . FLEXIBLE SIGMOIDOSCOPY N/A 06/24/2017   Procedure: FLEXIBLE SIGMOIDOSCOPY;  Surgeon: Manus Gunning, MD;  Location: [facility];  Service: Gastroenterology;  Laterality: N/A;  . GANGLION CYST EXCISION    . KNEE ARTHROSCOPY  age about 54 yrs  . TONSILLECTOMY    . TOTAL ABDOMINAL HYSTERECTOMY W/ BILATERAL SALPINGOOPHORECTOMY  at age 69 yrs   For endometriosis    SOCIAL HISTORY: Social History   Socioeconomic History  . Marital status: Widowed    Spouse name: Not on file  . Number of  children: 2  . Years of education: Not on file  . Highest education level: Not on file  Occupational History  . Not on file  Social Needs  . Financial resource strain: Not on file  . Food insecurity:    Worry: Not on file    Inability: Not on file  . Transportation needs:    Medical: Not on file    Non-medical: Not on file  Tobacco Use  . Smoking status: Former Smoker    Packs/day: 1.00    Years: 60.00    Pack years: 60.00    Types: Cigarettes    Last attempt to quit: 12/06/2015    Years since quitting: 2.9  . Smokeless tobacco: Never Used  Substance and Sexual Activity  . Alcohol use: No    Alcohol/week: 0.0 standard drinks  . Drug use: No  . Sexual activity: Never  Lifestyle  . Physical activity:    Days per week: Not on file    Minutes per session: Not on file  . Stress: Not on file  Relationships  . Social connections:    Talks on phone: Not on file    Gets together: Not  on file    Attends religious service: Not on file    Active member of club or organization: Not on file    Attends meetings of clubs or organizations: Not on file    Relationship status: Not on file  . Intimate partner violence:    Fear of current or ex partner: Not on file    Emotionally abused: Not on file    Physically abused: Not on file    Forced sexual activity: Not on file  Other Topics Concern  . Not on file  Social History Narrative  . Not on file    FAMILY HISTORY: Family History  Problem Relation Age of Onset  . Colon cancer Brother   . Colon cancer Brother   . Stroke Mother   . Colon cancer Father   . Breast cancer Daughter 21       ER/PR+ stage II    ALLERGIES:  is allergic to penicillins; remeron [mirtazapine]; and latex. patient wonders if she has a penicillin allergy but notes that she is uncertain about this.  MEDICATIONS:  Current Outpatient Medications  Medication Sig Dispense Refill  . amLODipine (NORVASC) 10 MG tablet TAKE 1 TABLET BY MOUTH EVERY DAY 90 tablet  0  . calcium-vitamin D (OSCAL WITH D) 500-200 MG-UNIT tablet Take 2 tablets by mouth 3 (three) times daily.    Cindy Byrd FLUoxetine (PROZAC) 10 MG tablet Take 1 tablet (10 mg total) by mouth daily. 30 tablet 1  . lidocaine-prilocaine (EMLA) cream Apply small amount over port 1-2 hours prior to treatment, cover with plastic wrap (DO NOT RUB IN). 30 g prn  . LORazepam (ATIVAN) 1 MG tablet Take 1 tablet (1 mg total) by mouth every 8 (eight) hours as needed for anxiety (or nausea). 30 tablet 0  . omeprazole (PRILOSEC) 40 MG capsule TAKE 1 CAPSULE BY MOUTH EVERY DAY 90 capsule 2  . saccharomyces boulardii (FLORASTOR) 250 MG capsule Take 1 capsule (250 mg total) by mouth 2 (two) times daily. 60 capsule 0  . traZODone (DESYREL) 50 MG tablet TAKE 1 TABLET BY MOUTH EVERYDAY AT BEDTIME 90 tablet 2  . Vitamin D, Ergocalciferol, (DRISDOL) 50000 units CAPS capsule Take 1 capsule (50,000 Units total) by mouth every 7 (seven) days. 12 capsule 3  . XARELTO 20 MG TABS tablet TAKE 1 TABLET DAILY WITH SUPPER. DC LOVENOX AND START XARELTO AT THE SCHEDULED TIME AS INSTRUCTED. 90 tablet 1   No current facility-administered medications for this visit.    Facility-Administered Medications Ordered in Other Visits  Medication Dose Route Frequency Provider Last Rate Last Dose  . denosumab (XGEVA) injection 120 mg  120 mg Subcutaneous Once [name], MD      . octreotide (SANDOSTATIN LAR) IM injection 30 mg  30 mg Intramuscular Once [name], MD        REVIEW OF SYSTEMS:    A 10+ POINT REVIEW OF SYSTEMS WAS OBTAINED including neurology, dermatology, psychiatry, cardiac, respiratory, lymph, extremities, GI, GU, Musculoskeletal, constitutional, breasts, reproductive, HEENT.  All pertinent positives are noted in the HPI.  All others are negative.   PHYSICAL EXAMINATION: ECOG PERFORMANCE STATUS: 2 - Symptomatic, <50% confined to bed  Vitals:   11/14/18 1203  BP: (!) 149/68  Pulse: 87  Resp: 18  Temp:  98.3 F (36.8 C)  SpO2: 99%   Filed Weights   11/14/18 1203  Weight: 154 lb 4.8 oz (70 kg)  . Cindy Byrd Wt Readings from Last 3 Encounters:  11/14/18 154  lb 4.8 oz (70 kg)  10/24/18 152 lb 3.2 oz (69 kg)  09/18/18 151 lb 14.4 oz (68.9 kg)   GENERAL:alert, in no acute distress and comfortable SKIN: no acute rashes, no significant lesions EYES: conjunctiva are pink and non-injected, sclera anicteric OROPHARYNX: MMM, no exudates, no oropharyngeal erythema or ulceration NECK: supple, no JVD LYMPH:  no palpable lymphadenopathy in the cervical, axillary or inguinal regions LUNGS: clear to auscultation b/l with normal respiratory effort HEART: regular rate & rhythm ABDOMEN:  normoactive bowel sounds , non tender, not distended. No palpable hepatosplenomegaly.  Extremity: no pedal edema PSYCH: alert & oriented x 3 with fluent speech NEURO: no focal motor/sensory deficits   LABORATORY DATA:  I have reviewed the data as listed  . CBC Latest Ref Rng & Units 11/14/2018 10/17/2018 09/18/2018  WBC 4.0 - 10.5 K/uL 7.2 7.0 7.0  Hemoglobin 12.0 - 15.0 g/dL 10.8(L) 11.1(L) 11.0(L)  Hematocrit 36.0 - 46.0 % 33.6(L) 35.7(L) 35.0(L)  Platelets 150 - 400 K/uL 194 205 182    . CMP Latest Ref Rng & Units 11/14/2018 10/24/2018 10/17/2018  Glucose 70 - 99 mg/dL 136(H) 153(H) 130(H)  BUN 8 - 23 mg/dL _0 Creatinine 0.44 - 1.00 mg/dL 1.17(H) 1.16(H) 1.23(H)  Sodium 135 - 145 mmol/L 139 143 141  Potassium 3.5 - 5.1 mmol/L 4.3 4.6 4.4  Chloride 98 - 111 mmol/L 109 110(H) 108  CO2 22 - 32 mmol/L _1 Calcium 8.9 - 10.3 mg/dL 9.4 9.0 8.8(L)  Total Protein 6.5 - 8.1 g/dL 7.8 7.4 7.7  Total Bilirubin 0.3 - 1.2 mg/dL 0.4 0.2 0.3  Alkaline Phos 38 - 126 U/L 72 76 75  AST 15 - 41 U/L _2 ALT 0 - 44 U/L _3 RADIOGRAPHIC STUDIES:  .Ct Abdomen Pelvis Wo Contrast  Result Date: 11/06/2018 CLINICAL DATA:  77 year old female with history of metastatic lung cancer. Follow-up study.  EXAM: CT CHEST, ABDOMEN AND PELVIS WITHOUT CONTRAST TECHNIQUE: Multidetector CT imaging of the chest, abdomen and pelvis was performed following the standard protocol without IV contrast. COMPARISON:  CT the chest, abdomen and pelvis 07/19/2018. FINDINGS: CT CHEST FINDINGS Cardiovascular: Heart size is normal. There is no significant pericardial fluid, thickening or pericardial calcification. There is aortic atherosclerosis, as well as atherosclerosis of the great vessels of the mediastinum and the coronary arteries, including calcified atherosclerotic plaque in the left main coronary artery. Right-sided single-lumen internal jugular Port-A-Cath with tip terminating in the distal superior vena cava. Mediastinum/Nodes: No pathologically enlarged mediastinal or hilar lymph nodes. Prominent but nonenlarged distal paraesophageal lymph nodes, similar to prior examination, measuring up to 9 mm. Thickening of the distal third of the esophagus. No axillary lymphadenopathy. Lungs/Pleura: 5 mm right lower lobe nodule (axial image 103 of series 6), and 4 mm right upper lobe nodule (axial image 58 of series 6), both stable dating back to 2016, considered benign. There continues to be a linear appearing area of architectural distortion in the left lower lobe (axial image 87 of series 6), stable dating back to 2016, presumably an area of chronic post infectious or inflammatory scarring. No other suspicious appearing pulmonary nodules or masses are noted. No acute consolidative airspace disease. No pleural effusions. Linear areas of scarring are noted throughout the mid to lower lungs bilaterally, similar to prior examinations. Mild diffuse bronchial wall thickening with mild to moderate centrilobular and paraseptal emphysema. Musculoskeletal: Multiple old healed posterior left-sided rib fractures  incidentally noted. There are no aggressive appearing lytic or blastic lesions noted in the visualized portions of the skeleton. CT  ABDOMEN PELVIS FINDINGS Hepatobiliary: Multiple low-attenuation lesions are noted throughout the liver, incompletely characterized on today's noncontrast CT examination, but similar to prior studies and statistically likely to represent tiny cysts. No other definite suspicious appearing hepatic lesions are noted on today's noncontrast CT examination. Unenhanced appearance of the gallbladder is normal. Pancreas: No definite pancreatic mass or peripancreatic fluid or inflammatory changes are noted on today's noncontrast CT examination. Spleen: Unremarkable. Adrenals/Urinary Tract: Unenhanced appearance of kidneys and bilateral adrenal glands is normal. No hydroureteronephrosis. Urinary bladder is normal in appearance. Stomach/Bowel: Unenhanced appearance of the stomach is normal. No pathologic dilatation of small bowel or colon. Numerous colonic diverticulae are noted, particularly in the sigmoid colon, without surrounding inflammatory changes to suggest an acute diverticulitis at this time. Normal appendix. Vascular/Lymphatic: Aortic atherosclerosis, without evidence of aneurysm or dissection in the abdominal or pelvic vasculature. No lymphadenopathy noted in the abdomen or pelvis. Reproductive: Status post hysterectomy. Ovaries are not confidently identified may be surgically absent or atrophic. Other: No significant volume of ascites.  No pneumoperitoneum. Musculoskeletal: Large infiltrative mixed lytic and sclerotic lesion in the left ilium with pathologic fracture, very similar to the prior examination. IMPRESSION: 1. Similar appearance of large mixed lytic and sclerotic lesion in the left ilium. No new metastatic lesions are otherwise noted elsewhere in the chest, abdomen or pelvis. 2. Interval development of thickening of the distal third of the esophagus. This is nonspecific, and could be related to underlying reflux esophagitis. However, if there is any clinical concern for Barrett's metaplasia or esophageal  neoplasia, further evaluation with nonemergent endoscopy could be considered. 3. Aortic atherosclerosis, in addition to left main coronary artery disease. Assessment for potential risk factor modification, dietary therapy or pharmacologic therapy may be warranted, if clinically indicated. 4. Diffuse bronchial wall thickening with mild to moderate centrilobular and paraseptal emphysema; imaging findings suggestive of underlying COPD. 5. Additional incidental findings, as above. Electronically Signed   By: Vinnie Langton M.D.   On: 11/06/2018 09:32   Ct Chest Wo Contrast  Result Date: 11/06/2018 CLINICAL DATA:  77 year old female with history of metastatic lung cancer. Follow-up study. EXAM: CT CHEST, ABDOMEN AND PELVIS WITHOUT CONTRAST TECHNIQUE: Multidetector CT imaging of the chest, abdomen and pelvis was performed following the standard protocol without IV contrast. COMPARISON:  CT the chest, abdomen and pelvis 07/19/2018. FINDINGS: CT CHEST FINDINGS Cardiovascular: Heart size is normal. There is no significant pericardial fluid, thickening or pericardial calcification. There is aortic atherosclerosis, as well as atherosclerosis of the great vessels of the mediastinum and the coronary arteries, including calcified atherosclerotic plaque in the left main coronary artery. Right-sided single-lumen internal jugular Port-A-Cath with tip terminating in the distal superior vena cava. Mediastinum/Nodes: No pathologically enlarged mediastinal or hilar lymph nodes. Prominent but nonenlarged distal paraesophageal lymph nodes, similar to prior examination, measuring up to 9 mm. Thickening of the distal third of the esophagus. No axillary lymphadenopathy. Lungs/Pleura: 5 mm right lower lobe nodule (axial image 103 of series 6), and 4 mm right upper lobe nodule (axial image 58 of series 6), both stable dating back to 2016, considered benign. There continues to be a linear appearing area of architectural distortion in the  left lower lobe (axial image 87 of series 6), stable dating back to 2016, presumably an area of chronic post infectious or inflammatory scarring. No other suspicious appearing pulmonary nodules or masses are  noted. No acute consolidative airspace disease. No pleural effusions. Linear areas of scarring are noted throughout the mid to lower lungs bilaterally, similar to prior examinations. Mild diffuse bronchial wall thickening with mild to moderate centrilobular and paraseptal emphysema. Musculoskeletal: Multiple old healed posterior left-sided rib fractures incidentally noted. There are no aggressive appearing lytic or blastic lesions noted in the visualized portions of the skeleton. CT ABDOMEN PELVIS FINDINGS Hepatobiliary: Multiple low-attenuation lesions are noted throughout the liver, incompletely characterized on today's noncontrast CT examination, but similar to prior studies and statistically likely to represent tiny cysts. No other definite suspicious appearing hepatic lesions are noted on today's noncontrast CT examination. Unenhanced appearance of the gallbladder is normal. Pancreas: No definite pancreatic mass or peripancreatic fluid or inflammatory changes are noted on today's noncontrast CT examination. Spleen: Unremarkable. Adrenals/Urinary Tract: Unenhanced appearance of kidneys and bilateral adrenal glands is normal. No hydroureteronephrosis. Urinary bladder is normal in appearance. Stomach/Bowel: Unenhanced appearance of the stomach is normal. No pathologic dilatation of small bowel or colon. Numerous colonic diverticulae are noted, particularly in the sigmoid colon, without surrounding inflammatory changes to suggest an acute diverticulitis at this time. Normal appendix. Vascular/Lymphatic: Aortic atherosclerosis, without evidence of aneurysm or dissection in the abdominal or pelvic vasculature. No lymphadenopathy noted in the abdomen or pelvis. Reproductive: Status post hysterectomy. Ovaries are  not confidently identified may be surgically absent or atrophic. Other: No significant volume of ascites.  No pneumoperitoneum. Musculoskeletal: Large infiltrative mixed lytic and sclerotic lesion in the left ilium with pathologic fracture, very similar to the prior examination. IMPRESSION: 1. Similar appearance of large mixed lytic and sclerotic lesion in the left ilium. No new metastatic lesions are otherwise noted elsewhere in the chest, abdomen or pelvis. 2. Interval development of thickening of the distal third of the esophagus. This is nonspecific, and could be related to underlying reflux esophagitis. However, if there is any clinical concern for Barrett's metaplasia or esophageal neoplasia, further evaluation with nonemergent endoscopy could be considered. 3. Aortic atherosclerosis, in addition to left main coronary artery disease. Assessment for potential risk factor modification, dietary therapy or pharmacologic therapy may be warranted, if clinically indicated. 4. Diffuse bronchial wall thickening with mild to moderate centrilobular and paraseptal emphysema; imaging findings suggestive of underlying COPD. 5. Additional incidental findings, as above. Electronically Signed   By: Vinnie Langton M.D.   On: 11/06/2018 09:32    ASSESSMENT & PLAN:   77 y.o. female with  #1 Metastatic poorly differentiated carcinoma with likely lung primary non-small cell lung cancer.   CT of the head with and without contrast showed no evidence of metastatic disease. Patient notes much improved pain control. EGFR blood test mutation analysis negative. Patient's pain is much better controlled after radiation for the painful ilium met. CT chest abdomen pelvis 04/19/2016 shows no evidence of disease progression. Patient tolerated Nivolumab very well but was discontinued when she developed grade 2 Immune colitis. Has been off Nivolumab for >6 months  CT chest abdomen pelvis on 06/24/2016 shows no evidence of new  disease or progression of metastatic disease. CT chest abdomen pelvis 09/06/2016 shows 1. Mixed interval response to therapy. 2. There is a new left ventral chest wall lesion deep to the pectoralis musculature worrisome for metastatic disease. 3. Posterior lower lobe nodular densities are identified which may reflect areas of pulmonary metastasis. 4. Interval decrease in size of destructive lesion involving the left iliac bone.  CT chest abd pelvis 12/08/2016: Cystic mass involving the left ventral chest wall  has resolved in the interval. Likely was a hematoma due to trauma. Interval increase in size of pleural base mass overlying the posterior and inferior left lower lobe. There is also a new left pleural effusion identified.  CT chest 02/01/2017: Residual irregular soft tissue thickening/volume loss and trace left pleural fluid at the base of the left hemithorax, overall improved in appearance from 12/08/2016. No measurable lesion.  CT chest 05/29/2017 shows no residual pleural based mass or significant pleural effusion in the left hemithorax. No evidence of thoracic metastatic disease. No evidence of progressive metastatic disease within the abdomen or pelvis. Mixed lytic and blastic lesion involving the left iliac bone and associated pathologic fracture are unchanged.   CT CAP 09/14/17 shows no new changes. She does have slight displacement of her fractured left iliac bone. Evidence of stable disease.   CT CAP 01/04/2018- No new or progressive metastatic disease. Stable large left iliac bone metastasis with associated chronic pathologic fracture.   CT chest/abd/pelvis done on 04/26/18 revealed Stable exam.  No new or progressive interval findings.  07/19/18 CT C/A/P revealed Stable exam.  No new or progressive interval findings. Large destructive left iliac lesion is similar to prior. Aortic Atherosclerosis and Emphysema.    PLAN: -Discussed pt labwork today, 11/14/18; HGB slightly decreased to 10.8,  other blood counts and chemistries are stable  -Discussed the 11/06/18 CT C/A/P which revealed Similar appearance of large mixed lytic and sclerotic lesion in the left ilium. No new metastatic lesions are otherwise noted elsewhere in the chest, abdomen or pelvis. 2. Interval development of thickening of the distal third of the esophagus. This is nonspecific, and could be related to underlying reflux esophagitis. However, if there is any clinical concern for Barrett's metaplasia or esophageal neoplasia, further evaluation with nonemergent endoscopy could be considered. 3. Aortic atherosclerosis, in addition to left main coronary artery disease. Assessment for potential risk factor modification, dietary therapy or pharmacologic therapy may be warranted, if clinically Indicated. 4. Diffuse bronchial wall thickening with mild to moderate centrilobular and paraseptal emphysema; imaging findings suggestive of underlying COPD. 5. Additional incidental findings, as above. -In view of the recently seen esophageal thickening, as noted above, I discussed the recommendation to pursue an endoscopy and advised that the pt return to care with Dr. Zenovia Jarred in GI  -The pt shows no clinical or lab progression of her lung cancer at this time. Cindy Byrd   Pennelope Bracken every 8 weeks -Recommended that the pt continue to eat well, drink at least 48-64 oz of water each day, and walk 20-30 minutes each day.  -Continue to limit salt intake with food and begin using sports compression socks for occasional ankle swelling -Trazodone for patient's sleep difficulties -2000 units Vitamin D daily -- adjust dose to maintain 25OH vit D levels of 40-60  -Will see the pt back in 2 months   #2 diarrhea-  now resolved was previously. 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 She was previously on on Lialda, budesonide,probiotics  and lomotil but not currently taking any of these. Plan -Continue Sandostatin every 4 weeks   #3 h/o diverticulitis and c dff colitis - now resolved Plan  -continue on sandostatin today and q4weeks.  -continue on Lialda  #4 DVT and PE  -continue on Xarelto - no issues with bleeding  #5 Barrett's esophagus 4cms in the distal esophagus with low and high-grade  dysplasia cannot rule out an early intra-mucosal esophageal adenocarcinoma. PLAN:  -patient notes that she does not want to f/u at [facility] for further intervention as per previous GI plan. -In view of the recently seen esophageal thickening on the 11/06/18 CT C/A/P, as noted above, I discussed the recommendation to pursue an endoscopy and advised that the pt return to care with Dr. Zenovia Jarred in GI, which the pt notes she will do   Continue monthly Sandostatin Continue q29monthy Xgeva Port flush q8weeks with each clinic visit and labs RTC with Dr KIrene Limbowith labs in 2 months GI referral to Dr PHilarie Fredricksonfor followup regarding her Barrett's esophagus with dysplasia with new CT changes in 1-2 weeks   All questions were answered. The patient knows to call the clinic with any problems, questions or concerns.  The total time spent in the appt was 30 minutes and more than 50% was on counseling and direct patient cares.   GSullivan LoneMD Cindy Hematology/Oncology Physician [facility] (Office): [phone](Work cell): [phone](Fax): [phone] I, SBaldwin [place] am acting as a scribe for Dr. GSullivan Lone   .I have reviewed the above documentation for accuracy and completeness, and I agree with the above. .Brunetta GeneraMD

## 2018-11-14 NOTE — Patient Instructions (Signed)
Call Dr. Zenovia Jarred in GI today to follow up with him regarding the recent esophageal thickening.

## 2018-11-16 ENCOUNTER — Telehealth: Payer: Self-pay

## 2018-11-16 NOTE — Telephone Encounter (Signed)
Spoke with patient concerning her upcoming appointments that was added to her current schedule.  Per 12/11 los no ref. In at this this time. Sandie RN is aware of this and will get back with me after she speaks to [place].

## 2018-11-17 ENCOUNTER — Other Ambulatory Visit: Payer: Self-pay | Admitting: Adult Health

## 2018-11-29 NOTE — Progress Notes (Signed)
Subjective:    Patient ID: Cindy Byrd, female    DOB: 23-Sep-1941, 77 y.o.   MRN: [id number]  HPI: 01/15/18 OV: [name] is here to establish as a new pt.  She is a pleasant 77 year old female.  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   She has 120 pack year hx, last tobacco use 2016. Current tx: 1) Active surveillance 2) Xgeva 120mg  [place] q4weeks for bone metastases. 3) Sandostatin q4weeks for diarrhea - immune colitis She has CT scan every 2 months/regular follow-up with Oncology/Dr. Irene Limbo- last OV was last week.  Per pt Dr. Irene Limbo calls her condition "Stable, not remission". She reports medication compliance and denies SE.  She denies CP/dyspnea/palpiations.  She has L hip pain that limits her exercising and prolonged walking. She reports excellent support system of local friends/family and denies thoughts of harming himself/others.  04/23/18 OV: Cindy Byrd presents for CPE She reports medication compliance, denies SE She has CT scan scheduled this Thursday She reports baseline dyspnea stable Reviewed all recent labs at length, CKD stable She has been trying to increase regular movement, ie gardening   Healthcare Maintanance: PAP- Not indicated Mammogram- Not indicated Colonoscopy- Not indicated Immunizations- declined  10/24/18 OV: Cindy Byrd is here for 6 month f/u:A fib, HTN She is followed closely by Oncology She reports only drinking one 16 oz coke/day She tries to follow heart healthy diet and denies any regular exercise She reports persistent anxiety/depression r/t Lung Ca She reports previously being on Prozac and tolerating it well She denies thoughts of harming herself/others She denies tobacco/vape/ETOH use She has CT scan schedule 11/06/18 Declined CBT referral today She reports mild dyspnea "when bending over", denies CP/palpitations/dizziness/HA  12/03/18 OV: Cindy Byrd is here for f/u: GAD and depression She was started on once daily Fluoxetine (Prozac) 10mg  5 weeks ago and reports "feeling more eager to get up and move" She reports even preparing Christmas dinner for her entire extended family. She would like to increase SSRI dosage, she denies thoughts of harming herself/others She walks as often as she can, limited by chronic L hip pain r/t cancer She again declined CBT referral   Patient Care Team    Relationship Specialty Notifications Start End  [name], NP PCP - General Family Medicine  01/15/18   [name], MD Consulting Physician Hematology and Oncology Admissions 06/24/15     Patient Active Problem List   Diagnosis Date Noted  . GAD (generalized anxiety disorder) 10/24/2018  . Hyperlipidemia 04/23/2018  . Chronic kidney disease 04/23/2018  . Elevated TSH 04/17/2018  . Cough 02/21/2018  . Elevated serum creatinine 02/21/2018  . Health care maintenance 01/15/2018  . Malignant neoplasm of lung ([place])   . Colitis 06/23/2017  . Left lower quadrant pain 06/23/2017  . Fracture of left iliac crest ([place]) 06/23/2017  . Metastatic lung cancer (metastasis from lung to other site), unspecified laterality ([place])   . Hypocalcemia 09/08/2016  . Vitamin D deficiency 09/08/2016  . New onset a-fib ([place]) 09/08/2016  . Chest pain   . Thrush of mouth and esophagus ([place])   . Protein-calorie malnutrition, severe ([place])   . Colitis determined by colorectal biopsy   . Bilateral pulmonary embolism ([place]) 09/07/2016  . DVT of lower extremity, bilateral ([place]) 09/07/2016  . Palliative care by specialist   . Goals of care, counseling/discussion   . Advance care  planning   . Diarrhea   . Ulcerative pancolitis without complication ([place])   . Cancer ([place])   . Pressure injury of skin 09/02/2016  . HCAP (healthcare-associated pneumonia) 09/01/2016  . ARF (acute renal failure) ([place]) 09/01/2016  . Nausea with vomiting 07/18/2016  . Dehydration  07/18/2016  . Hypokalemia 07/18/2016  . Hypoalbuminemia due to protein-calorie malnutrition ([place]) 07/18/2016  . Peripheral edema 07/18/2016  . Diarrhea due to drug 05/19/2016  . Port catheter in place 04/07/2016  . Nicotine addiction 01/19/2016  . Barrett's esophagus determined by biopsy 09/04/2015  . Gastritis   . Primary malignant neoplasm of lung metastatic to other site ([place]) 08/06/2015  . Esophageal reflux 08/04/2015  . Bone metastasis ([place]) 06/24/2015  . Neoplasm related pain 06/24/2015  . Protein calorie malnutrition ([place]) 06/24/2015  . Anorexia 06/24/2015  . Heavy smoker (more than 20 cigarettes per day) 06/24/2015  . Depression 06/24/2015  . HTN (hypertension) 06/24/2015     Past Medical History:  Diagnosis Date  . Barrett's esophagus   . Bone neoplasm 06/24/2015  . Cancer [facility])    metastatic poorly differentiated carcinoma. tumor left groin surgical removal with radiation tx.  . Cataract    BILATERAL  . Cigarette smoker two packs a day or less    Currently still smoking 2 PPD - Not interested in quitting at this time.  . Colitis 2017  . Colon polyps    hyperplastic, tubular adenomas, tubulovillous adenoma  . Cough, persistent    hx. lung cancer ? primary-being evaluated, unsure of primary site.  . Depression 06/24/2015  . Diverticulosis   . Emphysema of lung ([place])   . Endometriosis    Hysterectomy with BSO at age 33 yrs  . Esophageal adenocarcinoma ([place]) 08/11/15   intramucosal  . Gastritis   . GERD (gastroesophageal reflux disease)   . H/O: pneumonia   . Hiatal hernia   . Hyperlipidemia   . Hypertension 06/24/2015   likely improved incidental to 40 lbs weight loss from her neoplasm. No Longer taking med for this as of 08-06-15  . IBS (irritable bowel syndrome)   . Pain    left hip-persistent"tumor of bone"-radiation tx. 10.  . Vitamin D deficiency disease      Past Surgical History:  Procedure Laterality Date  . BARTHOLIN GLAND CYST EXCISION  77 yo ago    Does not want if it was an infected cyst or tumor. Was soon as delivery  . CATARACT EXTRACTION    . COLONOSCOPY W/ POLYPECTOMY     multiple times - last done 09/2014 per patient.  . ESOPHAGOGASTRODUODENOSCOPY (EGD) WITH PROPOFOL N/A 08/11/2015   Procedure: ESOPHAGOGASTRODUODENOSCOPY (EGD) WITH PROPOFOL;  Surgeon: [name], MD;  Location: [facility] ENDOSCOPY;  Service: Gastroenterology;  Laterality: N/A;  . FLEXIBLE SIGMOIDOSCOPY N/A 06/24/2017   Procedure: FLEXIBLE SIGMOIDOSCOPY;  Surgeon: Manus Gunning, MD;  Location: [facility] ENDOSCOPY;  Service: Gastroenterology;  Laterality: N/A;  . GANGLION CYST EXCISION    . KNEE ARTHROSCOPY  age about 53 yrs  . TONSILLECTOMY    . TOTAL ABDOMINAL HYSTERECTOMY W/ BILATERAL SALPINGOOPHORECTOMY  at age 6 yrs   For endometriosis     Family History  Problem Relation Age of Onset  . Colon cancer Brother   . Colon cancer Brother   . Stroke Mother   . Colon cancer Father   . Breast cancer Daughter 53       ER/PR+ stage II     Social History   Substance and Sexual Activity  Drug Use No     Social History   Substance and Sexual Activity  Alcohol Use No  . Alcohol/week: 0.0 standard drinks     Social History   Tobacco Use  Smoking Status Former Smoker  . Packs/day: 1.00  . Years: 60.00  . Pack years: 60.00  . Types: Cigarettes  . Last attempt to quit: 12/06/2015  . Years since quitting: 2.9  Smokeless Tobacco Never Used     Outpatient Encounter Medications as of 12/03/2018  Medication Sig  . amLODipine (NORVASC) 10 MG tablet TAKE 1 TABLET BY MOUTH EVERY DAY  . calcium-vitamin D (OSCAL WITH D) 500-200 MG-UNIT tablet Take 2 tablets by mouth 3 (three) times daily.  [name] FLUoxetine (PROZAC) 20 MG tablet Take 1 tablet (20 mg total) by mouth daily.  [name] lidocaine-prilocaine (EMLA) cream Apply small amount over port 1-2 hours prior to treatment, cover with plastic wrap (DO NOT RUB IN).  . LORazepam (ATIVAN) 1 MG tablet Take 1 tablet (1 mg  total) by mouth every 8 (eight) hours as needed for anxiety (or nausea).  [name] omeprazole (PRILOSEC) 40 MG capsule TAKE 1 CAPSULE BY MOUTH EVERY DAY  . saccharomyces boulardii (FLORASTOR) 250 MG capsule Take 1 capsule (250 mg total) by mouth 2 (two) times daily.  . traZODone (DESYREL) 50 MG tablet TAKE 1 TABLET BY MOUTH EVERYDAY AT BEDTIME  . Vitamin D, Ergocalciferol, (DRISDOL) 50000 units CAPS capsule Take 1 capsule (50,000 Units total) by mouth every 7 (seven) days.  Alveda Reasons 20 MG TABS tablet TAKE 1 TABLET DAILY WITH SUPPER. DC LOVENOX AND START XARELTO AT THE SCHEDULED TIME AS INSTRUCTED.  . [DISCONTINUED] FLUoxetine (PROZAC) 10 MG tablet Take 1 tablet (10 mg total) by mouth daily. PATIENT MUST HAVE OFFICE VISIT PRIOR TO ANY FURTHER REFILLS   No facility-administered encounter medications on file as of 12/03/2018.     Allergies: Penicillins; Remeron [mirtazapine]; and Latex  Body mass index is 22.92 kg/m.  Blood pressure 123/69, pulse 68, temperature 98.4 F (36.9 C), temperature source Oral, height 5\' 9"  (1.753 m), weight 155 lb 3.2 oz (70.4 kg), SpO2 98 %.  Review of Systems  Constitutional: Positive for fatigue. Negative for activity change, appetite change, chills, diaphoresis, fever and unexpected weight change.  HENT: Negative for congestion.   Eyes: Negative for visual disturbance.  Respiratory: Positive for shortness of breath. Negative for cough, chest tightness, wheezing and stridor.   Cardiovascular: Negative for chest pain, palpitations and leg swelling.  Gastrointestinal: Negative for abdominal distention, abdominal pain, blood in stool, constipation, diarrhea, nausea and vomiting.  Genitourinary: Negative for difficulty urinating and flank pain.  Musculoskeletal: Positive for arthralgias, gait problem and myalgias. Negative for back pain, joint swelling, neck pain and neck stiffness.  Skin: Negative for color change, pallor, rash and wound.  Neurological: Negative for  dizziness and headaches.  Hematological: Does not bruise/bleed easily.  Psychiatric/Behavioral: Positive for dysphoric mood. Negative for agitation, behavioral problems, confusion, decreased concentration, hallucinations, self-injury, sleep disturbance and suicidal ideas. The patient is nervous/anxious. The patient is not hyperactive.        Objective:   Physical Exam  Constitutional: She is oriented to person, place, and time. She appears well-developed and well-nourished. No distress.  HENT:  Head: Normocephalic and atraumatic.  Right Ear: External ear normal. Tympanic membrane is not erythematous and not bulging. No decreased hearing is noted.  Left Ear: External ear normal. Tympanic membrane is not erythematous and not bulging. No decreased hearing is noted.  Nose:  Nose normal. No mucosal edema or rhinorrhea. Right sinus exhibits no maxillary sinus tenderness and no frontal sinus tenderness. Left sinus exhibits no maxillary sinus tenderness and no frontal sinus tenderness.  Mouth/Throat: Oropharynx is clear and moist and mucous membranes are normal. Abnormal dentition. No dental caries.  Eyes: Pupils are equal, round, and reactive to light. Conjunctivae and EOM are normal.  Cardiovascular: Normal rate, regular rhythm, normal heart sounds and intact distal pulses.  No murmur heard. Pulmonary/Chest: Effort normal. No respiratory distress. She has no decreased breath sounds. She has no wheezes. She has no rhonchi. She has no rales. She exhibits no tenderness. Right breast exhibits no inverted nipple, no mass, no nipple discharge, no skin change and no tenderness. Left breast exhibits no inverted nipple, no mass, no nipple discharge, no skin change and no tenderness.  R anterior chest port-a-cath  Abdominal: Soft. Bowel sounds are normal. She exhibits no distension and no mass. There is no abdominal tenderness. There is no rebound and no guarding.  Musculoskeletal: Normal range of motion.   Neurological: She is alert and oriented to person, place, and time. Coordination normal.  Skin: Skin is warm and dry. No rash noted. She is not diaphoretic. No erythema. No pallor.  Psychiatric: She has a normal mood and affect. Her behavior is normal. Judgment and thought content normal.  Nursing note and vitals reviewed.     Assessment & Plan:   1. Depression, unspecified depression type     Depression Continue all medications as directed, with one change- Increased Fluoxetine from 10mg  to 20mg  once daily. Continue to drink plenty of water, follow heart healthy diet, and remain as active as possible. Recommend water aerobics or yoga, activities that will not worsen hip pain. Follow-up in 6 months for physical, sooner if needed.    FOLLOW-UP:  Return in about 6 months (around 06/04/2019) for Regular Follow Up.

## 2018-12-03 ENCOUNTER — Encounter: Payer: Self-pay | Admitting: Adult Health

## 2018-12-03 ENCOUNTER — Ambulatory Visit (INDEPENDENT_AMBULATORY_CARE_PROVIDER_SITE_OTHER): Payer: Medicare Other | Admitting: Adult Health

## 2018-12-03 DIAGNOSIS — F32A Depression, unspecified: Secondary | ICD-10-CM

## 2018-12-03 DIAGNOSIS — F329 Major depressive disorder, single episode, unspecified: Secondary | ICD-10-CM | POA: Diagnosis not present

## 2018-12-03 MED ORDER — FLUOXETINE HCL 20 MG PO TABS: 20.0000 mg | ORAL_TABLET | Freq: Every day | ORAL | 3 refills | Status: DC

## 2018-12-03 NOTE — Patient Instructions (Addendum)
Exercising to Stay Healthy To become healthy and stay healthy, it is recommended that you do moderate-intensity and vigorous-intensity exercise. You can tell that you are exercising at a moderate intensity if your heart starts beating faster and you start breathing faster but can still hold a conversation. You can tell that you are exercising at a vigorous intensity if you are breathing much harder and faster and cannot hold a conversation while exercising. Exercising regularly is important. It has many health benefits, such as:  Improving overall fitness, flexibility, and endurance.  Increasing bone density.  Helping with weight control.  Decreasing body fat.  Increasing muscle strength.  Reducing stress and tension.  Improving overall health. How often should I exercise? Choose an activity that you enjoy, and set realistic goals. Your health care provider can help you make an activity plan that works for you. Exercise regularly as told by your health care provider. This may include:  Doing strength training two times a week, such as: ? Lifting weights. ? Using resistance bands. ? Push-ups. ? Sit-ups. ? Yoga.  Doing a certain intensity of exercise for a given amount of time. Choose from these options: ? A total of 150 minutes of moderate-intensity exercise every week. ? A total of 75 minutes of vigorous-intensity exercise every week. ? A mix of moderate-intensity and vigorous-intensity exercise every week. Children, pregnant women, people who have not exercised regularly, people who are overweight, and older adults may need to talk with a health care provider about what activities are safe to do. If you have a medical condition, be sure to talk with your health care provider before you start a new exercise program. What are some exercise ideas? Moderate-intensity exercise ideas include:  Walking 1 mile (1.6 km) in about 15  minutes.  Biking.  Hiking.  Golfing.  Dancing.  Water aerobics. Vigorous-intensity exercise ideas include:  Walking 4.5 miles (7.2 km) or more in about 1 hour.  Jogging or running 5 miles (8 km) in about 1 hour.  Biking 10 miles (16.1 km) or more in about 1 hour.  Lap swimming.  Roller-skating or in-line skating.  Cross-country skiing.  Vigorous competitive sports, such as football, basketball, and soccer.  Jumping rope.  Aerobic dancing. What are some everyday activities that can help me to get exercise?  Yard work, such as: ? Pushing a lawn mower. ? Raking and bagging leaves.  Washing your car.  Pushing a stroller.  Shoveling snow.  Gardening.  Washing windows or floors. How can I be more active in my day-to-day activities?  Use stairs instead of an elevator.  Take a walk during your lunch break.  If you drive, park your car farther away from your work or school.  If you take public transportation, get off one stop early and walk the rest of the way.  Stand up or walk around during all of your indoor phone calls.  Get up, stretch, and walk around every 30 minutes throughout the day.  Enjoy exercise with a friend. Support to continue exercising will help you keep a regular routine of activity. What guidelines can I follow while exercising?  Before you start a new exercise program, talk with your health care provider.  Do not exercise so much that you hurt yourself, feel dizzy, or get very short of breath.  Wear comfortable clothes and wear shoes with good support.  Drink plenty of water while you exercise to prevent dehydration or heat stroke.  Work out until your breathing   and your heartbeat get faster. Where to find more information  U.S. Department of Health and Human Services: Bonded[employer].at  Centers for Disease Control and Prevention (CDC): http://www.wolf.info/ Summary  Exercising regularly is important. It will improve your overall fitness,  flexibility, and endurance.  Regular exercise also will improve your overall health. It can help you control your weight, reduce stress, and improve your bone density.  Do not exercise so much that you hurt yourself, feel dizzy, or get very short of breath.  Before you start a new exercise program, talk with your health care provider. This information is not intended to replace advice given to you by your health care provider. Make sure you discuss any questions you have with your health care provider. Document Released: 12/24/2010 Document Revised: 10/12/2017 Document Reviewed: 10/12/2017 Elsevier Interactive Patient Education  2019 [place] all medications as directed, with one change- Increased Fluoxetine from 10mg  to 20mg  once daily. Continue to drink plenty of water, follow heart healthy diet, and remain as active as possible. Recommend water aerobics or yoga, activities that will not worsen hip pain. Follow-up in 6 months for physical, sooner if needed. GREAT TO SEE YOU! HAPPY NEW YEAR!

## 2018-12-03 NOTE — Assessment & Plan Note (Signed)
Continue all medications as directed, with one change- Increased Fluoxetine from 10mg  to 20mg  once daily. Continue to drink plenty of water, follow heart healthy diet, and remain as active as possible. Recommend water aerobics or yoga, activities that will not worsen hip pain. Follow-up in 6 months for physical, sooner if needed.

## 2018-12-07 ENCOUNTER — Telehealth: Payer: Self-pay | Admitting: Internal Medicine

## 2018-12-07 ENCOUNTER — Telehealth: Payer: Self-pay | Admitting: *Deleted

## 2018-12-07 ENCOUNTER — Other Ambulatory Visit: Payer: Self-pay | Admitting: Internal Medicine

## 2018-12-07 DIAGNOSIS — R9389 Abnormal findings on diagnostic imaging of other specified body structures: Secondary | ICD-10-CM

## 2018-12-07 DIAGNOSIS — K2270 Barrett's esophagus without dysplasia: Secondary | ICD-10-CM

## 2018-12-07 NOTE — Telephone Encounter (Signed)
   Cindy Byrd [birth year] [id number]  Dear Dr Irene Limbo:  We have scheduled the above named patient for a(n) endoscopy procedure. Our records show that (s)he is on anticoagulation therapy.  Please advise as to whether the patient may come off their therapy of Xarelto 2 days prior to their procedure which is scheduled for 01/02/2019.  Please route your response to Dixon Boos, CMA.  Sincerely,  [facility]

## 2018-12-07 NOTE — Telephone Encounter (Signed)
I have spoken to patient to advise that she will need procedure scheduled at the hospital for procedure. She has been scheduled for 01/02/2019 at 1:45 pm with 12:15 pm arrival. I have given her verbal prep instructions and will also send written instructions as well. Patient verbalizes understanding of this and is also aware that we will be in contact with Dr Irene Limbo regarding anticoagulation clearance. She is NOT to discontinue xarelto on her own without clearance from Dr Irene Limbo. She also verbalizes clear understanding of this.

## 2018-12-07 NOTE — Telephone Encounter (Signed)
Pt returning call stating that she is on a blood thinner.

## 2018-12-07 NOTE — Telephone Encounter (Signed)
-----   Message from [name], MD sent at 11/29/2018 10:34 AM EST ----- Will need to be scheduled at the hospital Okay to not come to the office if we can clear anticoagulation prior to procedure Thanks JMP ----- Message ----- From: Larina Bras, CMA Sent: 11/23/2018   2:51 PM EST To: [name], MD  Patient is on xarelto and has a port-a-cath in place. Will she need to be scheduled at hospital so they can access port-a-cath? Also, will she need to see someone for anticoag or are you comfortable with her having clearance without a visit to our office first? ----- Message ----- From: [name], MD Sent: 11/22/2018   2:09 PM EST To: Larina Bras, CMA  Pt needs to be scheduled for EGD Can be direct, or after seeing APP JMP ----- Message ----- From: [name], MD Sent: 11/19/2018  11:36 PM EST To: [name], MD

## 2018-12-12 ENCOUNTER — Inpatient Hospital Stay: Payer: Medicare Other | Attending: Hematology

## 2018-12-12 ENCOUNTER — Inpatient Hospital Stay: Payer: Medicare Other

## 2018-12-12 ENCOUNTER — Other Ambulatory Visit: Payer: Self-pay | Admitting: Hematology

## 2018-12-12 VITALS — BP 123/76 | HR 68 | Temp 98.0°F | Resp 18

## 2018-12-12 DIAGNOSIS — Z95828 Presence of other vascular implants and grafts: Secondary | ICD-10-CM

## 2018-12-12 DIAGNOSIS — C3491 Malignant neoplasm of unspecified part of right bronchus or lung: Secondary | ICD-10-CM

## 2018-12-12 DIAGNOSIS — C3490 Malignant neoplasm of unspecified part of unspecified bronchus or lung: Secondary | ICD-10-CM

## 2018-12-12 DIAGNOSIS — C7951 Secondary malignant neoplasm of bone: Secondary | ICD-10-CM | POA: Diagnosis not present

## 2018-12-12 LAB — CBC WITH DIFFERENTIAL/PLATELET
Abs Immature Granulocytes: 0.02 10*3/uL (ref 0.00–0.07)
BASOS ABS: 0.1 10*3/uL (ref 0.0–0.1)
Basophils Relative: 1 %
Eosinophils Absolute: 0.2 10*3/uL (ref 0.0–0.5)
Eosinophils Relative: 2 %
HCT: 33.9 % — ABNORMAL LOW (ref 36.0–46.0)
Hemoglobin: 10.5 g/dL — ABNORMAL LOW (ref 12.0–15.0)
IMMATURE GRANULOCYTES: 0 %
Lymphocytes Relative: 21 %
Lymphs Abs: 1.6 10*3/uL (ref 0.7–4.0)
MCH: 27.3 pg (ref 26.0–34.0)
MCHC: 31 g/dL (ref 30.0–36.0)
MCV: 88.3 fL (ref 80.0–100.0)
Monocytes Absolute: 0.6 10*3/uL (ref 0.1–1.0)
Monocytes Relative: 8 %
NEUTROS PCT: 68 %
NRBC: 0 % (ref 0.0–0.2)
Neutro Abs: 5.2 10*3/uL (ref 1.7–7.7)
Platelets: 199 10*3/uL (ref 150–400)
RBC: 3.84 MIL/uL — ABNORMAL LOW (ref 3.87–5.11)
RDW: 14.7 % (ref 11.5–15.5)
WBC: 7.7 10*3/uL (ref 4.0–10.5)

## 2018-12-12 LAB — CMP (CANCER CENTER ONLY)
ALBUMIN: 3.3 g/dL — AB (ref 3.5–5.0)
ALT: 9 U/L (ref 0–44)
AST: 18 U/L (ref 15–41)
Alkaline Phosphatase: 62 U/L (ref 38–126)
Anion gap: 9 (ref 5–15)
BUN: 22 mg/dL (ref 8–23)
CO2: 24 mmol/L (ref 22–32)
Calcium: 8.7 mg/dL — ABNORMAL LOW (ref 8.9–10.3)
Chloride: 106 mmol/L (ref 98–111)
Creatinine: 1.39 mg/dL — ABNORMAL HIGH (ref 0.44–1.00)
GFR, Est AFR Am: 42 mL/min — ABNORMAL LOW (ref >60)
GFR, Estimated: 36 mL/min — ABNORMAL LOW (ref >60)
Glucose, Bld: 174 mg/dL — ABNORMAL HIGH (ref 70–99)
Potassium: 5 mmol/L (ref 3.5–5.1)
Sodium: 139 mmol/L (ref 135–145)
Total Bilirubin: 0.4 mg/dL (ref 0.3–1.2)
Total Protein: 7.5 g/dL (ref 6.5–8.1)

## 2018-12-12 MED ORDER — DENOSUMAB 120 MG/1.7ML ~~LOC~~ SOLN
SUBCUTANEOUS | Status: AC
  Filled 2018-12-12: qty 1.7

## 2018-12-12 MED ORDER — OCTREOTIDE ACETATE 30 MG IM KIT
30.0000 mg | PACK | Freq: Once | INTRAMUSCULAR | Status: AC
  Administered 2018-12-12: 30 mg via INTRAMUSCULAR

## 2018-12-12 MED ORDER — DENOSUMAB 120 MG/1.7ML ~~LOC~~ SOLN
120.0000 mg | Freq: Once | SUBCUTANEOUS | Status: AC
  Administered 2018-12-12: 120 mg via SUBCUTANEOUS

## 2018-12-12 MED ORDER — OCTREOTIDE ACETATE 30 MG IM KIT
PACK | INTRAMUSCULAR | Status: AC
  Filled 2018-12-12: qty 1

## 2018-12-12 NOTE — Patient Instructions (Signed)
Denosumab injection What is this medicine? DENOSUMAB (den oh sue mab) slows bone breakdown. Prolia is used to treat osteoporosis in women after menopause and in men, and in people who are taking corticosteroids for 6 months or more. Xgeva is used to treat a high calcium level due to cancer and to prevent bone fractures and other bone problems caused by multiple myeloma or cancer bone metastases. Xgeva is also used to treat giant cell tumor of the bone. This medicine may be used for other purposes; ask your health care provider or pharmacist if you have questions. COMMON BRAND NAME(S): Prolia, XGEVA What should I tell my health care provider before I take this medicine? They need to know if you have any of these conditions: -dental disease -having surgery or tooth extraction -infection -kidney disease -low levels of calcium or Vitamin D in the blood -malnutrition -on hemodialysis -skin conditions or sensitivity -thyroid or parathyroid disease -an unusual reaction to denosumab, other medicines, foods, dyes, or preservatives -pregnant or trying to get pregnant -breast-feeding How should I use this medicine? This medicine is for injection under the skin. It is given by a health care professional in a hospital or clinic setting. A special MedGuide will be given to you before each treatment. Be sure to read this information carefully each time. For Prolia, talk to your pediatrician regarding the use of this medicine in children. Special care may be needed. For Xgeva, talk to your pediatrician regarding the use of this medicine in children. While this drug may be prescribed for children as young as 13 years for selected conditions, precautions do apply. Overdosage: If you think you have taken too much of this medicine contact a poison control center or emergency room at once. NOTE: This medicine is only for you. Do not share this medicine with others. What if I miss a dose? It is important not to  miss your dose. Call your doctor or health care professional if you are unable to keep an appointment. What may interact with this medicine? Do not take this medicine with any of the following medications: -other medicines containing denosumab This medicine may also interact with the following medications: -medicines that lower your chance of fighting infection -steroid medicines like prednisone or cortisone This list may not describe all possible interactions. Give your health care provider a list of all the medicines, herbs, non-prescription drugs, or dietary supplements you use. Also tell them if you smoke, drink alcohol, or use illegal drugs. Some items may interact with your medicine. What should I watch for while using this medicine? Visit your doctor or health care professional for regular checks on your progress. Your doctor or health care professional may order blood tests and other tests to see how you are doing. Call your doctor or health care professional for advice if you get a fever, chills or sore throat, or other symptoms of a cold or flu. Do not treat yourself. This drug may decrease your body's ability to fight infection. Try to avoid being around people who are sick. You should make sure you get enough calcium and vitamin D while you are taking this medicine, unless your doctor tells you not to. Discuss the foods you eat and the vitamins you take with your health care professional. See your dentist regularly. Brush and floss your teeth as directed. Before you have any dental work done, tell your dentist you are receiving this medicine. Do not become pregnant while taking this medicine or for 5 months   after stopping it. Talk with your doctor or health care professional about your birth control options while taking this medicine. Women should inform their doctor if they wish to become pregnant or think they might be pregnant. There is a potential for serious side effects to an unborn  child. Talk to your health care professional or pharmacist for more information. What side effects may I notice from receiving this medicine? Side effects that you should report to your doctor or health care professional as soon as possible: -allergic reactions like skin rash, itching or hives, swelling of the face, lips, or tongue -bone pain -breathing problems -dizziness -jaw pain, especially after dental work -redness, blistering, peeling of the skin -signs and symptoms of infection like fever or chills; cough; sore throat; pain or trouble passing urine -signs of low calcium like fast heartbeat, muscle cramps or muscle pain; pain, tingling, numbness in the hands or feet; seizures -unusual bleeding or bruising -unusually weak or tired Side effects that usually do not require medical attention (report to your doctor or health care professional if they continue or are bothersome): -constipation -diarrhea -headache -joint pain -loss of appetite -muscle pain -runny nose -tiredness -upset stomach This list may not describe all possible side effects. Call your doctor for medical advice about side effects. You may report side effects to FDA at 1-800-FDA-1088. Where should I keep my medicine? This medicine is only given in a clinic, doctor's office, or other health care setting and will not be stored at home. NOTE: This sheet is a summary. It may not cover all possible information. If you have questions about this medicine, talk to your doctor, pharmacist, or health care provider.  2019 Elsevier/Gold Standard (2018-03-30 16:10:44) Octreotide injection solution What is this medicine? OCTREOTIDE (ok TREE oh tide) is used to reduce blood levels of growth hormone in patients with a condition called acromegaly. This medicine also reduces flushing and watery diarrhea caused by certain types of cancer. This medicine may be used for other purposes; ask your health care provider or pharmacist if you  have questions. COMMON BRAND NAME(S): Sandostatin, Sandostatin LAR What should I tell my health care provider before I take this medicine? They need to know if you have any of these conditions: -gallbladder disease -kidney disease -liver disease -an unusual or allergic reaction to octreotide, other medicines, foods, dyes, or preservatives -pregnant or trying to get pregnant -breast-feeding How should I use this medicine? This medicine is for injection under the skin or into a vein (only in emergency situations). It is usually given by a health care professional in a hospital or clinic setting. If you get this medicine at home, you will be taught how to prepare and give this medicine. Allow the injection solution to come to room temperature before use. Do not warm it artificially. Use exactly as directed. Take your medicine at regular intervals. Do not take your medicine more often than directed. It is important that you put your used needles and syringes in a special sharps container. Do not put them in a trash can. If you do not have a sharps container, call your pharmacist or healthcare provider to get one. Talk to your pediatrician regarding the use of this medicine in children. Special care may be needed. Overdosage: If you think you have taken too much of this medicine contact a poison control center or emergency room at once. NOTE: This medicine is only for you. Do not share this medicine with others. What if I miss  a dose? If you miss a dose, take it as soon as you can. If it is almost time for your next dose, take only that dose. Do not take double or extra doses. What may interact with this medicine? Do not take this medicine with any of the following medications: -cisapride -droperidol -general anesthetics -grepafloxacin -perphenazine -thioridazine This medicine may also interact with the following medications: -bromocriptine -cyclosporine -diuretics -medicines for blood  pressure, heart disease, irregular heart beat -medicines for diabetes, including insulin -quinidine This list may not describe all possible interactions. Give your health care provider a list of all the medicines, herbs, non-prescription drugs, or dietary supplements you use. Also tell them if you smoke, drink alcohol, or use illegal drugs. Some items may interact with your medicine. What should I watch for while using this medicine? Visit your doctor or health care professional for regular checks on your progress. To help reduce irritation at the injection site, use a different site for each injection and make sure the solution is at room temperature before use. This medicine may cause increases or decreases in blood sugar. Signs of high blood sugar include frequent urination, unusual thirst, flushed or dry skin, difficulty breathing, drowsiness, stomach ache, nausea, vomiting or dry mouth. Signs of low blood sugar include chills, cool, pale skin or cold sweats, drowsiness, extreme hunger, fast heartbeat, headache, nausea, nervousness or anxiety, shakiness, trembling, unsteadiness, tiredness, or weakness. Contact your doctor or health care professional right away if you experience any of these symptoms. This medicine may cause a decrease in vitamin B12. You should make sure that you get enough vitamin B12 while you are taking this medicine. Discuss the foods you eat and the vitamins you take with your health care professional. What side effects may I notice from receiving this medicine? Side effects that you should report to your doctor or health care professional as soon as possible: -allergic reactions like skin rash, itching or hives, swelling of the face, lips, or tongue -changes in blood sugar -changes in heart rate -severe stomach pain Side effects that usually do not require medical attention (report to your doctor or health care professional if they continue or are bothersome): -diarrhea or  constipation -gas or stomach pain -nausea, vomiting -pain, redness, swelling and irritation at site where injected This list may not describe all possible side effects. Call your doctor for medical advice about side effects. You may report side effects to FDA at 1-800-FDA-1088. Where should I keep my medicine? Keep out of the reach of children. Store in a refrigerator between 2 and 8 degrees C (36 and 46 degrees F). Protect from light. Allow to come to room temperature naturally. Do not use artificial heat. If protected from light, the injection may be stored at room temperature between 20 and 30 degrees C (70 and 86 degrees F) for 14 days. After the initial use, throw away any unused portion of a multiple dose vial after 14 days. Throw away unused portions of the ampules after use. NOTE: This sheet is a summary. It may not cover all possible information. If you have questions about this medicine, talk to your doctor, pharmacist, or health care provider.  2019 Elsevier/Gold Standard (2017-07-07 13:24:44)

## 2018-12-13 NOTE — Telephone Encounter (Signed)
I have left a voicemail for Dr Grier Mitts nurse asking that she have Dr Irene Limbo look at our clearance request letter for Xarelto.

## 2018-12-17 ENCOUNTER — Telehealth: Payer: Self-pay | Admitting: Internal Medicine

## 2018-12-17 NOTE — Telephone Encounter (Signed)
Cindy Byrd called form Dr Thamas Jaegers we have recv the learance request the pt for Xarelto. She stated you can giver her a call.

## 2018-12-17 NOTE — Telephone Encounter (Signed)
Left message for Cindy Byrd that I have not received clearance letter for Xarelto yet. I asked that she refax info to [place] at fax [phone] or send clearance through epic to Dixon Boos, [place].

## 2018-12-18 ENCOUNTER — Other Ambulatory Visit: Payer: Self-pay | Admitting: Adult Health

## 2018-12-18 ENCOUNTER — Telehealth: Payer: Self-pay | Admitting: *Deleted

## 2018-12-18 NOTE — Telephone Encounter (Signed)
See 12/07/2018 telephone note.....  ===View-only below this line=== ----- Message ----- From: Rolland Bimler, RN Sent: 12/18/2018   4:33 PM EST To: Larina Bras, CMA Subject: Lubertha South, Per Dr. Irene Limbo, Ms. Hush may hold Xarelto for 48 hours prior to EGD on 01/02/2019. Inocencio Homes, RN

## 2018-12-18 NOTE — Telephone Encounter (Signed)
I have spoken to patient to advise that per Dr Irene Limbo, she may hold Xarelto 48 hours prior to her endoscopy procedure. She verbalizes understanding of this.

## 2018-12-18 NOTE — Telephone Encounter (Signed)
===  View-only below this line=== ----- Message ----- From: Rolland Bimler, RN Sent: 12/18/2018   4:33 PM EST To: Larina Bras, CMA Subject: Cindy Byrd, Per Dr. Irene Limbo, Ms. Lewis may hold Xarelto for 48 hours prior to EGD on 01/02/2019. Inocencio Homes, RN

## 2018-12-18 NOTE — Telephone Encounter (Signed)
Contacted by Corine Shelter, CMA at [place]. Seeking order from Dr. Irene Limbo for patient to hold Xarelto for 48 hours prior to [facility] on 01/02/2019. Per Dr. Irene Limbo, can hold [facility] for 48 hours prior to scheduled test. Sent message to Ms. Smith via [place].

## 2018-12-25 ENCOUNTER — Ambulatory Visit: Payer: Medicare Other | Admitting: Internal Medicine

## 2019-01-01 ENCOUNTER — Other Ambulatory Visit: Payer: Self-pay

## 2019-01-01 ENCOUNTER — Encounter (HOSPITAL_COMMUNITY): Payer: Self-pay | Admitting: *Deleted

## 2019-01-01 NOTE — Progress Notes (Signed)
Spoke with patient via telephone for pre procedure interview. Patient to take Norvasc, Celexa, Prozac and Prilosec AM of procedure. Driver Valla Leaver. Arrival time 1215.

## 2019-01-02 ENCOUNTER — Encounter (HOSPITAL_COMMUNITY): Payer: Self-pay | Admitting: Internal Medicine

## 2019-01-02 ENCOUNTER — Ambulatory Visit (HOSPITAL_COMMUNITY): Payer: Medicare Other | Admitting: Anesthesiology

## 2019-01-02 ENCOUNTER — Encounter (HOSPITAL_COMMUNITY)
Admission: RE | Disposition: A | Discharge status: Home / Self Care | Payer: Self-pay | Source: Home / Self Care | Attending: Internal Medicine

## 2019-01-02 ENCOUNTER — Ambulatory Visit (HOSPITAL_COMMUNITY)
Admission: RE | Admit: 2019-01-02 | Discharge: 2019-01-02 | Disposition: A | Discharge status: Home / Self Care | Payer: Medicare Other | Attending: Internal Medicine | Admitting: Internal Medicine

## 2019-01-02 ENCOUNTER — Other Ambulatory Visit: Payer: Self-pay

## 2019-01-02 DIAGNOSIS — K228 Other specified diseases of esophagus: Secondary | ICD-10-CM | POA: Diagnosis not present

## 2019-01-02 DIAGNOSIS — Z91040 Latex allergy status: Secondary | ICD-10-CM | POA: Diagnosis not present

## 2019-01-02 DIAGNOSIS — Z86010 Personal history of colonic polyps: Secondary | ICD-10-CM | POA: Insufficient documentation

## 2019-01-02 DIAGNOSIS — Z888 Allergy status to other drugs, medicaments and biological substances status: Secondary | ICD-10-CM | POA: Insufficient documentation

## 2019-01-02 DIAGNOSIS — C3490 Malignant neoplasm of unspecified part of unspecified bronchus or lung: Secondary | ICD-10-CM | POA: Insufficient documentation

## 2019-01-02 DIAGNOSIS — Z90710 Acquired absence of both cervix and uterus: Secondary | ICD-10-CM | POA: Insufficient documentation

## 2019-01-02 DIAGNOSIS — Z7901 Long term (current) use of anticoagulants: Secondary | ICD-10-CM | POA: Diagnosis not present

## 2019-01-02 DIAGNOSIS — E559 Vitamin D deficiency, unspecified: Secondary | ICD-10-CM | POA: Diagnosis not present

## 2019-01-02 DIAGNOSIS — K449 Diaphragmatic hernia without obstruction or gangrene: Secondary | ICD-10-CM | POA: Diagnosis not present

## 2019-01-02 DIAGNOSIS — F419 Anxiety disorder, unspecified: Secondary | ICD-10-CM | POA: Diagnosis not present

## 2019-01-02 DIAGNOSIS — C155 Malignant neoplasm of lower third of esophagus: Secondary | ICD-10-CM | POA: Insufficient documentation

## 2019-01-02 DIAGNOSIS — K219 Gastro-esophageal reflux disease without esophagitis: Secondary | ICD-10-CM | POA: Insufficient documentation

## 2019-01-02 DIAGNOSIS — Z9849 Cataract extraction status, unspecified eye: Secondary | ICD-10-CM | POA: Insufficient documentation

## 2019-01-02 DIAGNOSIS — K22711 Barrett's esophagus with high grade dysplasia: Secondary | ICD-10-CM | POA: Diagnosis not present

## 2019-01-02 DIAGNOSIS — J439 Emphysema, unspecified: Secondary | ICD-10-CM | POA: Diagnosis not present

## 2019-01-02 DIAGNOSIS — K279 Peptic ulcer, site unspecified, unspecified as acute or chronic, without hemorrhage or perforation: Secondary | ICD-10-CM | POA: Insufficient documentation

## 2019-01-02 DIAGNOSIS — Z880 Allergy status to penicillin: Secondary | ICD-10-CM | POA: Diagnosis not present

## 2019-01-02 DIAGNOSIS — K2270 Barrett's esophagus without dysplasia: Secondary | ICD-10-CM

## 2019-01-02 DIAGNOSIS — Z803 Family history of malignant neoplasm of breast: Secondary | ICD-10-CM | POA: Insufficient documentation

## 2019-01-02 DIAGNOSIS — Z823 Family history of stroke: Secondary | ICD-10-CM | POA: Insufficient documentation

## 2019-01-02 DIAGNOSIS — K2289 Other specified disease of esophagus: Secondary | ICD-10-CM

## 2019-01-02 DIAGNOSIS — Z8501 Personal history of malignant neoplasm of esophagus: Secondary | ICD-10-CM | POA: Diagnosis not present

## 2019-01-02 DIAGNOSIS — I10 Essential (primary) hypertension: Secondary | ICD-10-CM | POA: Insufficient documentation

## 2019-01-02 DIAGNOSIS — K589 Irritable bowel syndrome without diarrhea: Secondary | ICD-10-CM | POA: Insufficient documentation

## 2019-01-02 DIAGNOSIS — Z8719 Personal history of other diseases of the digestive system: Secondary | ICD-10-CM | POA: Insufficient documentation

## 2019-01-02 DIAGNOSIS — E785 Hyperlipidemia, unspecified: Secondary | ICD-10-CM | POA: Diagnosis not present

## 2019-01-02 DIAGNOSIS — C159 Malignant neoplasm of esophagus, unspecified: Secondary | ICD-10-CM | POA: Diagnosis not present

## 2019-01-02 DIAGNOSIS — Z800 Family history of malignant neoplasm of digestive organs: Secondary | ICD-10-CM | POA: Diagnosis not present

## 2019-01-02 DIAGNOSIS — F329 Major depressive disorder, single episode, unspecified: Secondary | ICD-10-CM | POA: Diagnosis not present

## 2019-01-02 DIAGNOSIS — R9389 Abnormal findings on diagnostic imaging of other specified body structures: Secondary | ICD-10-CM

## 2019-01-02 HISTORY — PX: ESOPHAGOGASTRODUODENOSCOPY (EGD) WITH PROPOFOL: SHX5813

## 2019-01-02 HISTORY — PX: BIOPSY: SHX5522

## 2019-01-02 SURGERY — ESOPHAGOGASTRODUODENOSCOPY (EGD) WITH PROPOFOL
Anesthesia: Monitor Anesthesia Care

## 2019-01-02 MED ORDER — PROPOFOL 10 MG/ML IV BOLUS
INTRAVENOUS | Status: AC
  Filled 2019-01-02: qty 40

## 2019-01-02 MED ORDER — PROPOFOL 10 MG/ML IV BOLUS
INTRAVENOUS | Status: DC | PRN
  Administered 2019-01-02: 20 mg via INTRAVENOUS

## 2019-01-02 MED ORDER — PROPOFOL 500 MG/50ML IV EMUL
INTRAVENOUS | Status: DC | PRN
  Administered 2019-01-02: 130 ug/kg/min via INTRAVENOUS

## 2019-01-02 MED ORDER — LACTATED RINGERS IV SOLN
INTRAVENOUS | Status: DC
  Administered 2019-01-02: 14:00:00 via INTRAVENOUS

## 2019-01-02 MED ORDER — SODIUM CHLORIDE 0.9 % IV SOLN: INTRAVENOUS | Status: DC

## 2019-01-02 MED ORDER — ONDANSETRON HCL 4 MG/2ML IJ SOLN
INTRAMUSCULAR | Status: DC | PRN
  Administered 2019-01-02: 4 mg via INTRAVENOUS

## 2019-01-02 SURGICAL SUPPLY — 15 items

## 2019-01-02 NOTE — Anesthesia Preprocedure Evaluation (Addendum)
Anesthesia Evaluation  Patient identified by MRN, date of birth, ID band Patient awake    Reviewed: Allergy & Precautions, NPO status , Patient's Chart, lab work & pertinent test results  Airway Mallampati: I  TM Distance: >3 FB Neck ROM: Full    Dental  (+) Dental Advisory Given, Partial Lower, Partial Upper   Pulmonary pneumonia, COPD, Current Smoker, former smoker,    Pulmonary exam normal        Cardiovascular hypertension, Pt. on medications Normal cardiovascular exam Rhythm:Regular     Neuro/Psych PSYCHIATRIC DISORDERS Anxiety Depression    GI/Hepatic hiatal hernia, PUD, GERD  Medicated and Controlled,  Endo/Other    Renal/GU Renal disease     Musculoskeletal   Abdominal   Peds  Hematology   Anesthesia Other Findings   Reproductive/Obstetrics                            Anesthesia Physical  Anesthesia Plan  ASA: III  Anesthesia Plan: MAC   Post-op Pain Management:    Induction: Intravenous  PONV Risk Score and Plan:   Airway Management Planned: Simple Face Mask  Additional Equipment:   Intra-op Plan:   Post-operative Plan:   Informed Consent: I have reviewed the patients History and Physical, chart, labs and discussed the procedure including the risks, benefits and alternatives for the proposed anesthesia with the patient or authorized representative who has indicated his/her understanding and acceptance.     Dental advisory given  Plan Discussed with: CRNA  Anesthesia Plan Comments: (Pt ma keep her teeth. Pt aware of Risks)       Anesthesia Quick Evaluation

## 2019-01-02 NOTE — Anesthesia Postprocedure Evaluation (Signed)
Anesthesia Post Note  Patient: Cindy Byrd  Procedure(s) Performed: ESOPHAGOGASTRODUODENOSCOPY (EGD) WITH PROPOFOL (N/A ) BIOPSY     Patient location during evaluation: Endoscopy Anesthesia Type: MAC Level of consciousness: awake and alert Pain management: pain level controlled Vital Signs Assessment: post-procedure vital signs reviewed and stable Respiratory status: spontaneous breathing, nonlabored ventilation, respiratory function stable and patient connected to nasal cannula oxygen Cardiovascular status: blood pressure returned to baseline and stable Postop Assessment: no apparent nausea or vomiting Anesthetic complications: no    Last Vitals:  Vitals:   01/02/19 1450 01/02/19 1500  BP: 118/71 (!) 133/54  Pulse: 60 64  Resp: 16 13  Temp:    SpO2: 97% 100%    Last Pain:  Vitals:   01/02/19 1450  TempSrc:   PainSc: 0-No pain                 Barnet Glasgow

## 2019-01-02 NOTE — Transfer of Care (Signed)
Immediate Anesthesia Transfer of Care Note  Patient: Cindy Byrd  Procedure(s) Performed: ESOPHAGOGASTRODUODENOSCOPY (EGD) WITH PROPOFOL (N/A ) BIOPSY  Patient Location: PACU and Endoscopy Unit  Anesthesia Type:MAC  Level of Consciousness: awake, alert , oriented and patient cooperative  Airway & Oxygen Therapy: Patient Spontanous Breathing and Patient connected to nasal cannula oxygen  Post-op Assessment: Report given to RN, Post -op Vital signs reviewed and stable and Patient moving all extremities  Post vital signs: Reviewed and stable  Last Vitals:  Vitals Value Taken Time  BP 114/55 01/02/2019  2:40 PM  Temp    Pulse 58 01/02/2019  2:41 PM  Resp 14 01/02/2019  2:41 PM  SpO2 100 % 01/02/2019  2:41 PM  Vitals shown include unvalidated device data.  Last Pain:  Vitals:   01/02/19 1305  TempSrc: Oral  PainSc: 0-No pain         Complications: No apparent anesthesia complications

## 2019-01-02 NOTE — Discharge Instructions (Signed)
YOU HAD AN ENDOSCOPIC PROCEDURE TODAY: Refer to the procedure report and other information in the discharge instructions given to you for any specific questions about what was found during the examination. If this information does not answer your questions, please call [facility] office at [phone] to clarify.   YOU SHOULD EXPECT: Some feelings of bloating in the abdomen. Passage of more gas than usual. Walking can help get rid of the air that was put into your GI tract during the procedure and reduce the bloating. If you had a lower endoscopy (such as a colonoscopy or flexible sigmoidoscopy) you may notice spotting of blood in your stool or on the toilet paper. Some abdominal soreness may be present for a day or two, also.  DIET: Your first meal following the procedure should be a light meal and then it is ok to progress to your normal diet. A half-sandwich or bowl of soup is an example of a good first meal. Heavy or fried foods are harder to digest and may make you feel nauseous or bloated. Drink plenty of fluids but you should avoid alcoholic beverages for 24 hours. If you had a esophageal dilation, please see attached instructions for diet.    ACTIVITY: Your care partner should take you home directly after the procedure. You should plan to take it easy, moving slowly for the rest of the day. You can resume normal activity the day after the procedure however YOU SHOULD NOT DRIVE, use power tools, machinery or perform tasks that involve climbing or major physical exertion for 24 hours (because of the sedation medicines used during the test).   SYMPTOMS TO REPORT IMMEDIATELY: A gastroenterologist can be reached at any hour. Please call [phone]  for any of the following symptoms:   Following upper endoscopy (EGD, EUS, ERCP, esophageal dilation) Vomiting of blood or coffee ground material  New, significant abdominal pain  New, significant chest pain or pain under the shoulder blades  Painful or  persistently difficult swallowing  New shortness of breath  Black, tarry-looking or red, bloody stools  FOLLOW UP:  If any biopsies were taken you will be contacted by phone or by letter within the next 1-3 weeks. Call [phone]  if you have not heard about the biopsies in 3 weeks.  Please also call with any specific questions about appointments or follow up tests.  

## 2019-01-02 NOTE — Op Note (Signed)
[facility] Patient Name: Cindy Byrd Procedure Date: 01/02/2019 MRN: [id number] Attending MD: [name] , MD Date of Birth: [birth year] CSN: [id number] Age: 78 Admit Type: Outpatient Procedure:                Upper GI endoscopy Indications:              Barrett's high grade dysplasia, Surveillance for                            malignancy due to personal history of Barrett's                            esophagus, Abnormal CT of the GI tract showing                            esophageal thickening Providers:                [name]. [name], MD, Cleda Daub, RN, Jeanella Cara, RN, Elspeth Cho Tech., Technician,                            Courtney Heys. [place], CRNA Referring MD:             [name] Medicines:                Monitored Anesthesia Care Complications:            No immediate complications. Estimated Blood Loss:     Estimated blood loss was minimal. Procedure:                Pre-Anesthesia Assessment:                           - Prior to the procedure, a History and Physical                            was performed, and patient medications and                            allergies were reviewed. The patient's tolerance of                            previous anesthesia was also reviewed. The risks                            and benefits of the procedure and the sedation                            options and risks were discussed with the patient.                            All questions were answered, and informed consent  was obtained. Prior Anticoagulants: The patient has                            taken Xarelto (rivaroxaban), last dose was 2 days                            prior to procedure. ASA Grade Assessment: III - A                            patient with severe systemic disease. After                            reviewing the risks and benefits, the patient was               deemed in satisfactory condition to undergo the                            procedure.                           After obtaining informed consent, the endoscope was                            passed under direct vision. Throughout the                            procedure, the patient's blood pressure, pulse, and                            oxygen saturations were monitored continuously. The                            GIF-H190 ([id number]) Olympus gastroscope was                            introduced through the mouth, and advanced to the                            second part of duodenum. The upper GI endoscopy was                            accomplished without difficulty. The patient                            tolerated the procedure well. Scope In: Scope Out: Findings:      A large, fungating mass with no bleeding was found in the lower third of       the esophagus, 35 cm from the incisors. The extends to the GE junction       at around 39 cm. The mass was partially obstructing and partially       circumferential (involving one-half of the lumen circumference).       Multiple biopsies were obtained with cold forceps for histology in a       targeted manner. This lesion arises in the setting of Barrett's  esophagus.      A single 8 mm mucosal nodule, probable separate foci of malignancy, was       found in the lower third of the esophagus, 34 cm from the incisors       (about 1 cm above the larger mass with more normal-appearing mucosa       intervening. Biopsies were taken with a cold forceps for histology.      A 2 cm hiatal hernia was present.      The entire examined stomach was normal.      The examined duodenum was normal. Impression:               - Partially obstructing, malignant esophageal tumor                            was found in the lower third of the esophagus.                           - Mucosal nodule found in the distal esophagus                             (just proximal to the mass described above).                            Biopsied.                           - 2 cm hiatal hernia.                           - Normal stomach.                           - Normal examined duodenum.                           - Multiple biopsies were obtained. Moderate Sedation:      N/A Recommendation:           - Patient has a contact number available for                            emergencies. The signs and symptoms of potential                            delayed complications were discussed with the                            patient. Return to normal activities tomorrow.                            Written discharge instructions were provided to the                            patient.                           - Soft diet.                           -  Continue present medications.                           - Resume Xarelto (rivaroxaban) at prior dose in 2                            days. Refer to managing physician for further                            adjustment of therapy.                           - Await pathology results.                           - Will alert Dr. Irene Limbo for further recommendations.                           - If dysphagia occurs, will have to consider IR                            placed PEG versus esophageal stenting. Procedure Code(s):        --- Professional ---                           [phone], Esophagogastroduodenoscopy, flexible,                            transoral; with biopsy, single or multiple Diagnosis Code(s):        --- Professional ---                           C15.5, Malignant neoplasm of lower third of                            esophagus                           K22.8, Other specified diseases of esophagus                           K44.9, Diaphragmatic hernia without obstruction or                            gangrene                           K22.711, Barrett's esophagus with high grade                             dysplasia                           R93.3, Abnormal findings on diagnostic imaging of                            other parts of digestive tract CPT copyright  2018 American [employer]. All rights reserved. The codes documented in this report are preliminary and upon coder review may  be revised to meet current compliance requirements. [name], MD 01/02/2019 2:49:31 PM This report has been signed electronically. Number of Addenda: 0

## 2019-01-02 NOTE — H&P (Addendum)
HPI: Cindy Byrd is a 78 yo female with PMH of metastatic non-small cell lung cancer, history of Barrett's esophagus with dysplasia, history of C. 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  She reports that on the whole she has been doing fairly well.  She is continued PPI therapy but noticed occasional burning in her chest.  Not as severe as her usual heartburn.  She denies dysphagia and odynophagia.  No abdominal pain.  Some loose stool but nothing like prior diarrhea related to her colitis.  She does use Imodium.  She denies chest pain and dyspnea today.  She had an upper endoscopy showing a dysplastic nodule arising from Barrett's esophagus in 2017.  She was referred to Dr. Adria Devon at [facility].  She had one appointment but due to her lung cancer therapy after this appointment a follow-up endoscopy was never performed.  Surveillance CT scan from 11/06/2018 of the chest showed thickening in the distal esophagus.  Xarelto has been on hold x48 hours  Past Medical History:  Diagnosis Date  . Barrett's esophagus   . Bone neoplasm 06/24/2015  . Cancer [facility])    metastatic poorly differentiated carcinoma. tumor left groin surgical removal with radiation tx.  . Cataract    BILATERAL  . Cigarette smoker two packs a day or less    Currently still smoking 2 PPD - Not interested in quitting at this time.  . Colitis 2017  . Colon polyps    hyperplastic, tubular adenomas, tubulovillous adenoma  . Cough, persistent    hx. lung cancer ? primary-being evaluated, unsure of primary site.  . Depression 06/24/2015  . Diverticulosis   . Emphysema of lung ([place])   . Endometriosis    Hysterectomy with BSO at age 42 yrs  . Esophageal adenocarcinoma ([place]) 08/11/15   intramucosal  . Gastritis   . GERD (gastroesophageal reflux disease)   . H/O: pneumonia   . Hiatal hernia   . Hyperlipidemia   . Hypertension 06/24/2015   likely improved incidental to 40 lbs  weight loss from her neoplasm. No Longer taking med for this as of 08-06-15  . IBS (irritable bowel syndrome)   . Pain    left hip-persistent"tumor of bone"-radiation tx. 10.  . Vitamin D deficiency disease     Past Surgical History:  Procedure Laterality Date  . BARTHOLIN GLAND CYST EXCISION  78 yo ago   Does not want if it was an infected cyst or tumor. Was soon as delivery  . CATARACT EXTRACTION    . COLONOSCOPY W/ POLYPECTOMY     multiple times - last done 09/2014 per patient.  . ESOPHAGOGASTRODUODENOSCOPY (EGD) WITH PROPOFOL N/A 08/11/2015   Procedure: ESOPHAGOGASTRODUODENOSCOPY (EGD) WITH PROPOFOL;  Surgeon: [name], MD;  Location: [facility] ENDOSCOPY;  Service: Gastroenterology;  Laterality: N/A;  . FLEXIBLE SIGMOIDOSCOPY N/A 06/24/2017   Procedure: FLEXIBLE SIGMOIDOSCOPY;  Surgeon: Manus Gunning, MD;  Location: [facility] ENDOSCOPY;  Service: Gastroenterology;  Laterality: N/A;  . GANGLION CYST EXCISION    . KNEE ARTHROSCOPY  age about 67 yrs  . TONSILLECTOMY    . TOTAL ABDOMINAL HYSTERECTOMY W/ BILATERAL SALPINGOOPHORECTOMY  at age 81 yrs   For endometriosis    (Not in an outpatient encounter)   Allergies  Allergen Reactions  . Penicillins Other (See Comments)    Unknown; childhood allergy  . Remeron [Mirtazapine] Other (See Comments)    nightmares  . Latex Rash    Family History  Problem  Relation Age of Onset  . Colon cancer Brother   . Colon cancer Brother   . Stroke Mother   . Colon cancer Father   . Breast cancer Daughter 52       ER/PR+ stage II    Social History   Tobacco Use  . Smoking status: Former Smoker    Packs/day: 1.00    Years: 60.00    Pack years: 60.00    Types: Cigarettes    Last attempt to quit: 12/06/2015    Years since quitting: 3.0  . Smokeless tobacco: Never Used  Substance Use Topics  . Alcohol use: No    Alcohol/week: 0.0 standard drinks  . Drug use: No    ROS: As per history of present illness, otherwise negative  BP (!)  158/55   Pulse 66   Temp 97.8 F (36.6 C) (Oral)   Resp 14   Ht 5\' 8"  (1.727 m)   Wt 70.3 kg   SpO2 97%   BMI 23.57 kg/m  Gen: awake, alert, NAD HEENT: anicteric, op clear CV: RRR, no mrg Pulm: CTA b/l Abd: soft, NT/ND, +BS throughout Ext: no c/c/e Neuro: nonfocal   RELEVANT LABS AND IMAGING: CBC    Component Value Date/Time   WBC 7.7 12/12/2018 1309   RBC 3.84 (L) 12/12/2018 1309   HGB 10.5 (L) 12/12/2018 1309   HGB 10.8 (L) 11/14/2018 1155   HGB 11.6 04/16/2018 0839   HGB 11.5 (L) 11/16/2017 1422   HCT 33.9 (L) 12/12/2018 1309   HCT 37.3 04/16/2018 0839   HCT 37.1 11/16/2017 1422   PLT 199 12/12/2018 1309   PLT 194 11/14/2018 1155   PLT 225 04/16/2018 0839   MCV 88.3 12/12/2018 1309   MCV 90 04/16/2018 0839   MCV 90.9 11/16/2017 1422   MCH 27.3 12/12/2018 1309   MCHC 31.0 12/12/2018 1309   RDW 14.7 12/12/2018 1309   RDW 14.4 04/16/2018 0839   RDW 14.1 11/16/2017 1422   LYMPHSABS 1.6 12/12/2018 1309   LYMPHSABS 1.8 04/16/2018 0839   LYMPHSABS 1.6 11/16/2017 1422   MONOABS 0.6 12/12/2018 1309   MONOABS 0.3 11/16/2017 1422   EOSABS 0.2 12/12/2018 1309   EOSABS 0.2 04/16/2018 0839   BASOSABS 0.1 12/12/2018 1309   BASOSABS 0.1 04/16/2018 0839   BASOSABS 0.0 11/16/2017 1422    CMP     Component Value Date/Time   NA 139 12/12/2018 1309   NA 143 10/24/2018 1412   NA 141 11/16/2017 1422   K 5.0 12/12/2018 1309   K 3.6 11/16/2017 1422   CL 106 12/12/2018 1309   CO2 24 12/12/2018 1309   CO2 28 11/16/2017 1422   GLUCOSE 174 (H) 12/12/2018 1309   GLUCOSE 149 (H) 11/16/2017 1422   BUN 22 12/12/2018 1309   BUN 18 10/24/2018 1412   BUN 12.6 11/16/2017 1422   CREATININE 1.39 (H) 12/12/2018 1309   CREATININE 0.9 11/16/2017 1422   CALCIUM 8.7 (L) 12/12/2018 1309   CALCIUM 8.9 11/16/2017 1422   PROT 7.5 12/12/2018 1309   PROT 7.4 10/24/2018 1412   PROT 7.8 11/16/2017 1422   ALBUMIN 3.3 (L) 12/12/2018 1309   ALBUMIN 4.0 10/24/2018 1412   ALBUMIN 3.3 (L)  11/16/2017 1422   AST 18 12/12/2018 1309   AST 43 (H) 11/16/2017 1422   ALT 9 12/12/2018 1309   ALT 27 11/16/2017 1422   ALKPHOS 62 12/12/2018 1309   ALKPHOS 72 11/16/2017 1422   BILITOT 0.4 12/12/2018 1309   BILITOT  0.36 11/16/2017 1422   GFRNONAA 36 (L) 12/12/2018 1309   GFRAA 42 (L) 12/12/2018 1309    ASSESSMENT/PLAN: 78 yo female with PMH of metastatic non-small cell lung cancer, history of Barrett's esophagus with dysplasia, history of C. 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  1. GERD/hx of Barrett's with dysplastic nodule/esophageal thickening by CT-- she was referred for evaluation of a dysplastic nodule in 2017, where endoscopy was recommended.  However, and unfortunately, due to her cancer therapy at that time she was unable to follow-up.  She is overdue for follow-up of this lesion.  Proceeding with upper endoscopy with monitored anesthesia care today. The nature of the procedure, as well as the risks, benefits, and alternatives were carefully and thoroughly reviewed with the patient. Ample time for discussion and questions allowed. The patient understood, was satisfied, and agreed to proceed.

## 2019-01-03 ENCOUNTER — Encounter (HOSPITAL_COMMUNITY): Payer: Self-pay | Admitting: Internal Medicine

## 2019-01-08 NOTE — Progress Notes (Signed)
[name]    HEMATOLOGY ONCOLOGY PROGRESS NOTE  Date of service:  01/09/19    Patient Care Team: [name], NP as PCP - General (Family Medicine) [name], MD as Consulting Physician (Hematology and Oncology)  CHIEF COMPLAINTS/PURPOSE OF CONSULTATION:  F/u for metastatic lung cancer  DIAGNOSIS:   #1 Metastatic non-small cell lung cancer with bilateral lung nodules and large metastatic lesion in the left Ilium. #2 Barrett's esophagus with some evidence of intramucosal adenocarcinoma of the esophagus. 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 Also had c diff colitis - treated   Current Treatment  1) Active surveillance 2) Xgeva 11m  q4weeks for bone metastases. 3) Sandostatin q4weeks for diarrhea - immune colitis  Previous Treatment  1 Palliative radiation therapy to the large left ilium metastases 2. IV Nivolumab x 20ycles (discontinued due to likely immune colitis) 3. Xgeva 1214mSC q4weeks for bone metastases.   HISTORY OF PRESENTING ILLNESS: (plz see my previous consultation for details of initial presentation)  INTERVAL HISTORY:   Ms ToCottones here for her scheduled follow-up for metastatic lung cancer. The patient's last visit with [place] on 11/14/18. The pt reports that she is doing well overall.   The pt reports that she has continued to feel the same as she did at our last visit, and denies senses of choking, food getting stuck in her esophagus, difficulty swallowing, or new abdominal pains. She notes that she sometimes has abdominal pains which is stable. She also notes that she develops lower back pains while standing, which is not new.  Of note since the patient's last visit, pt has had an Upper endoscopy completed on 01/02/19 with GI Dr. JaZenovia Jarredith biopsy results revealing esophageal adenocarcinoma int he lower 1/3 of esophagus and GEJ.  Lab results today  (01/09/19) of CBC w/diff, Reticulocyts, and CMP is as follows: all values are WNL except for HGB at 10.5, HCT at 33.8, Glucose at 125, Creatinine at 1.31, Calcium at 8.8, GFR at 39.  On review of systems, pt reports stable energy levels, breathing well, stable lower back pain, and denies sensation of choking, difficulty swallowing, new abdominal pains, and any other symptoms.  MEDICAL HISTORY:  Past Medical History:  Diagnosis Date  . Barrett's esophagus   . Bone neoplasm 06/24/2015  . [facility]   metastatic poorly differentiated carcinoma. tumor left groin surgical removal with radiation tx.  . Cataract    BILATERAL  . Cigarette smoker two packs a day or less    Currently still smoking 2 PPD - Not interested in quitting at this time.  . Colitis 2017  . Colon polyps    hyperplastic, tubular adenomas, tubulovillous adenoma  . Cough, persistent    hx. lung cancer ? primary-being evaluated, unsure of primary site.  . Depression 06/24/2015  . Diverticulosis   . [place]  . Endometriosis    Hysterectomy with BSO at age [age]rs  . Esophageal [facility] [place]   intramucosal  . Gastritis   . GERD (gastroesophageal reflux disease)   . H/O: pneumonia   . Hiatal hernia   . Hyperlipidemia   . Hypertension 06/24/2015   likely improved incidental to 40 lbs weight loss from her neoplasm. No Longer taking med for this as of 08-06-15  . IBS (irritable bowel syndrome)   . Pain    left hip-persistent"tumor of bone"-radiation tx. 10.  .[name]  Vitamin D deficiency disease    SURGICAL HISTORY: Past Surgical History:  Procedure Laterality Date  . BARTHOLIN GLAND CYST EXCISION  78 yo ago   Does not want if it was an infected cyst or tumor. Was soon as delivery  . BIOPSY  01/02/2019   Procedure: BIOPSY;  Surgeon: [name], MD;  Location: [facility];  Service: Gastroenterology;;  . CATARACT EXTRACTION    . COLONOSCOPY W/ POLYPECTOMY     multiple times - last done 09/2014  per patient.  . ESOPHAGOGASTRODUODENOSCOPY (EGD) WITH PROPOFOL N/A 08/11/2015   Procedure: ESOPHAGOGASTRODUODENOSCOPY (EGD) WITH PROPOFOL;  Surgeon: [name], MD;  Location: [facility];  Service: Gastroenterology;  Laterality: N/A;  . ESOPHAGOGASTRODUODENOSCOPY (EGD) WITH PROPOFOL N/A 01/02/2019   Procedure: ESOPHAGOGASTRODUODENOSCOPY (EGD) WITH PROPOFOL;  Surgeon: [name], MD;  Location: [facility];  Service: Gastroenterology;  Laterality: N/A;  . FLEXIBLE SIGMOIDOSCOPY N/A 06/24/2017   Procedure: FLEXIBLE SIGMOIDOSCOPY;  Surgeon: Manus Gunning, MD;  Location: [facility];  Service: Gastroenterology;  Laterality: N/A;  . GANGLION CYST EXCISION    . KNEE ARTHROSCOPY  age about 85 yrs  . TONSILLECTOMY    . TOTAL ABDOMINAL HYSTERECTOMY W/ BILATERAL SALPINGOOPHORECTOMY  at age 28 yrs   For endometriosis    SOCIAL HISTORY: Social History   Socioeconomic History  . Marital status: Widowed    Spouse name: Not on file  . Number of children: 2  . Years of education: Not on file  . Highest education level: Not on file  Occupational History  . Not on file  Social Needs  . Financial resource strain: Not on file  . Food insecurity:    Worry: Not on file    Inability: Not on file  . Transportation needs:    Medical: Not on file    Non-medical: Not on file  Tobacco Use  . Smoking status: Former Smoker    Packs/day: 1.00    Years: 60.00    Pack years: 60.00    Types: Cigarettes    Last attempt to quit: 12/06/2015    Years since quitting: 3.0  . Smokeless tobacco: Never Used  Substance and Sexual Activity  . Alcohol use: No    Alcohol/week: 0.0 standard drinks  . Drug use: No  . Sexual activity: Never  Lifestyle  . Physical activity:    Days per week: Not on file    Minutes per session: Not on file  . Stress: Not on file  Relationships  . Social connections:    Talks on phone: Not on file    Gets together: Not on file    Attends religious service: Not on  file    Active member of club or organization: Not on file    Attends meetings of clubs or organizations: Not on file    Relationship status: Not on file  . Intimate partner violence:    Fear of current or ex partner: Not on file    Emotionally abused: Not on file    Physically abused: Not on file    Forced sexual activity: Not on file  Other Topics Concern  . Not on file  Social History Narrative  . Not on file    FAMILY HISTORY: Family History  Problem Relation Age of Onset  . Colon cancer Brother   . Colon cancer Brother   . Stroke Mother   . Colon cancer Father   . Breast cancer Daughter 59       ER/PR+ stage  II    ALLERGIES:  is allergic to penicillins; remeron [mirtazapine]; and latex. patient wonders if she has a penicillin allergy but notes that she is uncertain about this.  MEDICATIONS:  Current Outpatient Medications  Medication Sig Dispense Refill  . amLODipine (NORVASC) 10 MG tablet TAKE 1 TABLET BY MOUTH EVERY DAY (Patient taking differently: Take 10 mg by mouth daily. ) 90 tablet 0  . Cholecalciferol (VITAMIN D3) 25 MCG (1000 UT) CAPS Take 1,000 Units by mouth daily.    . citalopram (CELEXA) 20 MG tablet Take 20 mg by mouth daily.    [name] FLUoxetine (PROZAC) 20 MG capsule Take 1 capsule (20 mg total) by mouth daily. 90 capsule 3  . omeprazole (PRILOSEC) 40 MG capsule TAKE 1 CAPSULE BY MOUTH EVERY DAY (Patient taking differently: Take 40 mg by mouth 2 (two) times daily. ) 90 capsule 2  . Probiotic Product (PROBIOTIC PO) Take 1 capsule by mouth 2 (two) times daily.    . traZODone (DESYREL) 50 MG tablet TAKE 1 TABLET BY MOUTH EVERYDAY AT BEDTIME (Patient taking differently: Take 50 mg by mouth at bedtime. ) 90 tablet 2  . XARELTO 20 MG TABS tablet TAKE 1 TABLET DAILY WITH SUPPER. DC LOVENOX AND START XARELTO AT THE SCHEDULED TIME AS INSTRUCTED. (Patient taking differently: Take 20 mg by mouth daily with supper. ) 90 tablet 1   No current facility-administered  medications for this visit.     REVIEW OF SYSTEMS:    A 10+ POINT REVIEW OF SYSTEMS WAS OBTAINED including neurology, dermatology, psychiatry, cardiac, respiratory, lymph, extremities, GI, GU, Musculoskeletal, constitutional, breasts, reproductive, HEENT.  All pertinent positives are noted in the HPI.  All others are negative.   PHYSICAL EXAMINATION: ECOG PERFORMANCE STATUS: 2 - Symptomatic, <50% confined to bed  Vitals:   01/09/19 1300  BP: 121/69  Pulse: 83  Resp: 18  Temp: 97.9 F (36.6 C)  SpO2: 98%   Filed Weights   01/09/19 1300  Weight: 150 lb 1.6 oz (68.1 kg)  . [name] Wt Readings from Last 3 Encounters:  01/09/19 150 lb 1.6 oz (68.1 kg)  01/02/19 155 lb (70.3 kg)  12/03/18 155 lb 3.2 oz (70.4 kg)    GENERAL:alert, in no acute distress and comfortable SKIN: no acute rashes, no significant lesions EYES: conjunctiva are pink and non-injected, sclera anicteric OROPHARYNX: MMM, no exudates, no oropharyngeal erythema or ulceration NECK: supple, no JVD LYMPH:  no palpable lymphadenopathy in the cervical, axillary or inguinal regions LUNGS: clear to auscultation b/l with normal respiratory effort HEART: regular rate & rhythm ABDOMEN:  normoactive bowel sounds , non tender, not distended. No palpable hepatosplenomegaly.  Extremity: no pedal edema PSYCH: alert & oriented x 3 with fluent speech NEURO: no focal motor/sensory deficits   LABORATORY DATA:  I have reviewed the data as listed  . CBC Latest Ref Rng & Units 01/09/2019 12/12/2018 11/14/2018  WBC 4.0 - 10.5 K/uL 6.9 7.7 7.2  Hemoglobin 12.0 - 15.0 g/dL 10.5(L) 10.5(L) 10.8(L)  Hematocrit 36.0 - 46.0 % 33.8(L) 33.9(L) 33.6(L)  Platelets 150 - 400 K/uL 213 199 194    . CMP Latest Ref Rng & Units 01/09/2019 12/12/2018 11/14/2018  Glucose 70 - 99 mg/dL 125(H) 174(H) 136(H)  BUN 8 - 23 mg/dL _0 Creatinine 0.44 - 1.00 mg/dL 1.31(H) 1.39(H) 1.17(H)  Sodium 135 - 145 mmol/L 140 139 139  Potassium 3.5 - 5.1 mmol/L  4.0 5.0 4.3  Chloride 98 - 111 mmol/L 106 106 109  CO2 22 - 32 mmol/L _0 Calcium 8.9 - 10.3 mg/dL 8.8(L) 8.7(L) 9.4  Total Protein 6.5 - 8.1 g/dL 7.6 7.5 7.8  Total Bilirubin 0.3 - 1.2 mg/dL 0.3 0.4 0.4  Alkaline Phos 38 - 126 U/L 61 62 72  AST 15 - 41 U/L _1 ALT 0 - 44 U/L _2 01/02/19 Esophagus Biopsy:   RADIOGRAPHIC STUDIES:  .No results found.  ASSESSMENT & PLAN:   78 y.o. female with  #1 Metastatic poorly differentiated carcinoma with likely lung primary non-small cell lung cancer.   CT of the head with and without contrast showed no evidence of metastatic disease. EGFR blood test mutation analysis negative. CT chest abdomen pelvis 04/19/2016 shows no evidence of disease progression. Patient tolerated Nivolumab very well but was discontinued when she developed grade 2 Immune colitis. Has been off Nivolumab for >6 months  CT chest abdomen pelvis on 06/24/2016 shows no evidence of new disease or progression of metastatic disease. CT chest abdomen pelvis 09/06/2016 shows 1. Mixed interval response to therapy. 2. There is a new left ventral chest wall lesion deep to the pectoralis musculature worrisome for metastatic disease. 3. Posterior lower lobe nodular densities are identified which may reflect areas of pulmonary metastasis. 4. Interval decrease in size of destructive lesion involving the left iliac bone.  CT chest abd pelvis 12/08/2016: Cystic mass involving the left ventral chest wall has resolved in the interval. Likely was a hematoma due to trauma. Interval increase in size of pleural base mass overlying the posterior and inferior left lower lobe. There is also a new left pleural effusion identified.  CT chest 02/01/2017: Residual irregular soft tissue thickening/volume loss and trace left pleural fluid at the base of the left hemithorax, overall improved in appearance from 12/08/2016. No measurable lesion.  CT chest 05/29/2017 shows no residual  pleural based mass or significant pleural effusion in the left hemithorax. No evidence of thoracic metastatic disease. No evidence of progressive metastatic disease within the abdomen or pelvis. Mixed lytic and blastic lesion involving the left iliac bone and associated pathologic fracture are unchanged.   CT CAP 09/14/17 shows no new changes. She does have slight displacement of her fractured left iliac bone. Evidence of stable disease.   CT CAP 01/04/2018- No new or progressive metastatic disease. Stable large left iliac bone metastasis with associated chronic pathologic fracture.   CT chest/abd/pelvis done on 04/26/18 revealed Stable exam.  No new or progressive interval findings.  07/19/18 CT C/A/P revealed Stable exam.  No new or progressive interval findings. Large destructive left iliac lesion is similar to prior. Aortic Atherosclerosis and Emphysema.    11/06/18 CT C/A/P revealed Similar appearance of large mixed lytic and sclerotic lesion in the left ilium. No new metastatic lesions are otherwise noted elsewhere in the chest, abdomen or pelvis. 2. Interval development of thickening of the distal third of the esophagus. This is nonspecific, and could be related to underlying reflux esophagitis. However, if there is any clinical concern for Barrett's metaplasia or esophageal neoplasia, further evaluation with nonemergent endoscopy could be considered. 3. Aortic atherosclerosis, in addition to left main coronary artery disease. Assessment for potential risk factor modification, dietary therapy or pharmacologic therapy may be warranted, if clinically Indicated. 4. Diffuse bronchial wall thickening with mild to moderate centrilobular and paraseptal emphysema; imaging findings suggestive of underlying COPD. 5. Additional incidental findings, as above.   #2 Newly diagnosed Adenocarcinoma of  the esophagus  Barrett's esophagus 4cms in the distal esophagus with low and high-grade dysplasia  #3 diarrhea-   now resolved was previously. 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 She was previously on on Lialda, budesonide,probiotics and lomotil but not currently taking any of these. Plan -Continue Sandostatin every 4 weeks   #4 h/o diverticulitis and c dff colitis - now resolved Plan  -continue on sandostatin today and q4weeks.  -continue on Lialda  #5 DVT and PE  -continue on Xarelto - no issues with bleeding   PLAN: -Discussed pt labwork today, 01/09/19; blood counts and chemistries are stable -Discussed the 01/02/19 Surgical pathology which revealed adenocarcinoma of the esophagus -Discussed that the staging is typically informed by the depth of involvement, T1-T3, with no lymph nodes nor obvious signs of metastasis on patient's recent 11/06/18 CT imaging  -Discussed the NCCN guidelines with the pt and my recommendation to pursue concurrent chemoradiation, not surgery, in the setting of the patient's stable metastatic lung cancer. The pt agrees with this recommendation and is also opposed to surgery herself. -Discussed that a PEG tube would need to be placed. Pt already has a port. -will have to hold Xarelto for 48hours prior to G tube placement. -Continue Xgeva and Sandostatin every 4 weeks -Continue 2000 units Vitamin D every day, with dose adjustment to maintain 25OH Vit D levels of 40-60 -Trazodone for sleep difficulty -Will tentatively begin C1 Carboplatin and Taxol in 12 days -Will refer the pt to [place] today -Will set pt up for chemotherapy counseling - IR for PEG tube placement (d/w GI - they do not place PEG tubes) -Will order PET/CT for accurate staging and treatment planning -Will see the pt back in 12 days   -Urgent referral to radiation oncology for consideration of definitive chemo-radiation for newly diagnosed esophageal cancer -referral to IR for G tube  placement -chemo-counselling in 3-5 days for carboplatin/Taxol chemotherapy -Plan to start carboplatin/taxol chemotherapy in 12 days with labs and MD visit -PET/CT in 5-7 days -port flush with each labs -continue Xgeva and Sandostatin q4weeks    All questions were answered. The patient knows to call the clinic with any problems, questions or concerns.  The total time spent in the appt was 40 minutes and more than 50% was on counseling and direct patient cares.   Sullivan Lone MD MS Hematology/Oncology Physician [facility]  (Office): [phone] (Work cell): [phone] (Fax): [phone]  I, Baldwin Jamaica, am acting as a scribe for Dr. Sullivan Lone.   .I have reviewed the above documentation for accuracy and completeness, and I agree with the above. [name] MD

## 2019-01-09 ENCOUNTER — Inpatient Hospital Stay: Payer: Medicare Other | Attending: Hematology

## 2019-01-09 ENCOUNTER — Inpatient Hospital Stay (HOSPITAL_BASED_OUTPATIENT_CLINIC_OR_DEPARTMENT_OTHER): Payer: Medicare Other | Admitting: Hematology

## 2019-01-09 ENCOUNTER — Inpatient Hospital Stay: Payer: Medicare Other

## 2019-01-09 VITALS — BP 121/69 | HR 83 | Temp 97.9°F | Resp 18 | Ht 68.0 in | Wt 150.1 lb

## 2019-01-09 DIAGNOSIS — C7951 Secondary malignant neoplasm of bone: Secondary | ICD-10-CM | POA: Insufficient documentation

## 2019-01-09 DIAGNOSIS — R109 Unspecified abdominal pain: Secondary | ICD-10-CM

## 2019-01-09 DIAGNOSIS — Z95828 Presence of other vascular implants and grafts: Secondary | ICD-10-CM

## 2019-01-09 DIAGNOSIS — Z5111 Encounter for antineoplastic chemotherapy: Secondary | ICD-10-CM | POA: Diagnosis not present

## 2019-01-09 DIAGNOSIS — I2699 Other pulmonary embolism without acute cor pulmonale: Secondary | ICD-10-CM | POA: Diagnosis not present

## 2019-01-09 DIAGNOSIS — I82401 Acute embolism and thrombosis of unspecified deep veins of right lower extremity: Secondary | ICD-10-CM | POA: Diagnosis not present

## 2019-01-09 DIAGNOSIS — C3490 Malignant neoplasm of unspecified part of unspecified bronchus or lung: Secondary | ICD-10-CM

## 2019-01-09 DIAGNOSIS — C3491 Malignant neoplasm of unspecified part of right bronchus or lung: Secondary | ICD-10-CM

## 2019-01-09 DIAGNOSIS — C155 Malignant neoplasm of lower third of esophagus: Secondary | ICD-10-CM

## 2019-01-09 DIAGNOSIS — Z87891 Personal history of nicotine dependence: Secondary | ICD-10-CM

## 2019-01-09 DIAGNOSIS — Z7901 Long term (current) use of anticoagulants: Secondary | ICD-10-CM

## 2019-01-09 DIAGNOSIS — R197 Diarrhea, unspecified: Secondary | ICD-10-CM

## 2019-01-09 DIAGNOSIS — C160 Malignant neoplasm of cardia: Secondary | ICD-10-CM

## 2019-01-09 DIAGNOSIS — M545 Low back pain: Secondary | ICD-10-CM

## 2019-01-09 DIAGNOSIS — Z7189 Other specified counseling: Secondary | ICD-10-CM

## 2019-01-09 LAB — CBC WITH DIFFERENTIAL (CANCER CENTER ONLY)
Abs Immature Granulocytes: 0.02 10*3/uL (ref 0.00–0.07)
Basophils Absolute: 0.1 10*3/uL (ref 0.0–0.1)
Basophils Relative: 1 %
Eosinophils Absolute: 0.2 10*3/uL (ref 0.0–0.5)
Eosinophils Relative: 2 %
HCT: 33.8 % — ABNORMAL LOW (ref 36.0–46.0)
Hemoglobin: 10.5 g/dL — ABNORMAL LOW (ref 12.0–15.0)
IMMATURE GRANULOCYTES: 0 %
LYMPHS ABS: 1.7 10*3/uL (ref 0.7–4.0)
LYMPHS PCT: 24 %
MCH: 26.6 pg (ref 26.0–34.0)
MCHC: 31.1 g/dL (ref 30.0–36.0)
MCV: 85.8 fL (ref 80.0–100.0)
Monocytes Absolute: 0.4 10*3/uL (ref 0.1–1.0)
Monocytes Relative: 6 %
Neutro Abs: 4.5 10*3/uL (ref 1.7–7.7)
Neutrophils Relative %: 67 %
Platelet Count: 213 10*3/uL (ref 150–400)
RBC: 3.94 MIL/uL (ref 3.87–5.11)
RDW: 14.4 % (ref 11.5–15.5)
WBC Count: 6.9 10*3/uL (ref 4.0–10.5)
nRBC: 0 % (ref 0.0–0.2)

## 2019-01-09 LAB — RETICULOCYTES
IMMATURE RETIC FRACT: 7.4 % (ref 2.3–15.9)
RBC.: 3.94 MIL/uL (ref 3.87–5.11)
RETIC COUNT ABSOLUTE: 47.7 10*3/uL (ref 19.0–186.0)
Retic Ct Pct: 1.2 % (ref 0.4–3.1)

## 2019-01-09 LAB — CMP (CANCER CENTER ONLY)
ALT: 13 U/L (ref 0–44)
AST: 24 U/L (ref 15–41)
Albumin: 3.5 g/dL (ref 3.5–5.0)
Alkaline Phosphatase: 61 U/L (ref 38–126)
Anion gap: 9 (ref 5–15)
BUN: 17 mg/dL (ref 8–23)
CHLORIDE: 106 mmol/L (ref 98–111)
CO2: 25 mmol/L (ref 22–32)
Calcium: 8.8 mg/dL — ABNORMAL LOW (ref 8.9–10.3)
Creatinine: 1.31 mg/dL — ABNORMAL HIGH (ref 0.44–1.00)
GFR, Est AFR Am: 45 mL/min — ABNORMAL LOW (ref >60)
GFR, Estimated: 39 mL/min — ABNORMAL LOW (ref >60)
Glucose, Bld: 125 mg/dL — ABNORMAL HIGH (ref 70–99)
POTASSIUM: 4 mmol/L (ref 3.5–5.1)
Sodium: 140 mmol/L (ref 135–145)
Total Bilirubin: 0.3 mg/dL (ref 0.3–1.2)
Total Protein: 7.6 g/dL (ref 6.5–8.1)

## 2019-01-09 MED ORDER — OCTREOTIDE ACETATE 30 MG IM KIT
PACK | INTRAMUSCULAR | Status: AC
  Filled 2019-01-09: qty 1

## 2019-01-09 MED ORDER — HEPARIN SOD (PORK) LOCK FLUSH 100 UNIT/ML IV SOLN
500.0000 [IU] | Freq: Once | INTRAVENOUS | Status: AC
  Administered 2019-01-09: 500 [IU]
  Filled 2019-01-09: qty 5

## 2019-01-09 MED ORDER — DENOSUMAB 120 MG/1.7ML ~~LOC~~ SOLN
SUBCUTANEOUS | Status: AC
  Filled 2019-01-09: qty 1.7

## 2019-01-09 MED ORDER — SODIUM CHLORIDE 0.9% FLUSH
10.0000 mL | Freq: Once | INTRAVENOUS | Status: AC
  Administered 2019-01-09: 10 mL
  Filled 2019-01-09: qty 10

## 2019-01-09 MED ORDER — OCTREOTIDE ACETATE 30 MG IM KIT
30.0000 mg | PACK | Freq: Once | INTRAMUSCULAR | Status: AC
  Administered 2019-01-09: 30 mg via INTRAMUSCULAR

## 2019-01-11 ENCOUNTER — Inpatient Hospital Stay: Payer: Medicare Other

## 2019-01-14 ENCOUNTER — Telehealth: Payer: Self-pay | Admitting: *Deleted

## 2019-01-14 DIAGNOSIS — C155 Malignant neoplasm of lower third of esophagus: Secondary | ICD-10-CM | POA: Insufficient documentation

## 2019-01-14 DIAGNOSIS — Z7189 Other specified counseling: Secondary | ICD-10-CM | POA: Insufficient documentation

## 2019-01-14 MED ORDER — PROCHLORPERAZINE MALEATE 10 MG PO TABS: 10.0000 mg | ORAL_TABLET | Freq: Four times a day (QID) | ORAL | 1 refills | Status: DC | PRN

## 2019-01-14 MED ORDER — DEXAMETHASONE 4 MG PO TABS: 8.0000 mg | ORAL_TABLET | Freq: Every day | ORAL | 1 refills | Status: DC

## 2019-01-14 MED ORDER — LIDOCAINE-PRILOCAINE 2.5-2.5 % EX CREA: TOPICAL_CREAM | CUTANEOUS | 3 refills | Status: DC

## 2019-01-14 MED ORDER — ONDANSETRON HCL 8 MG PO TABS: 8.0000 mg | ORAL_TABLET | Freq: Two times a day (BID) | ORAL | 1 refills | Status: DC | PRN

## 2019-01-14 MED ORDER — LORAZEPAM 0.5 MG PO TABS: 0.5000 mg | ORAL_TABLET | Freq: Four times a day (QID) | ORAL | 0 refills | Status: DC | PRN

## 2019-01-14 NOTE — Progress Notes (Signed)
Patient on plan of care prior to pathways. 

## 2019-01-14 NOTE — Telephone Encounter (Signed)
Contacted patient per Dr. Irene Limbo directions:  to inform her that IR has arranged for PEG on 2/20 and will contact her with time (1pm)  and that Radiology will contact r/t scheduling PET scan. Patient verbalized understanding of PEG placement, but expressed that she did not know MD had ordered PET. PER MD note/plan 2/5, PET was discussed with patient. Encouraged patient to contact office for further questions or concerns.  Contacted Tiffany - scheduler in IR to let her know patient is aware of PEG. Tiffany will call patient with time and instructions. Per Dr. Irene Limbo, patient will need to hold Xarelto for 48 hours pre-PEG placement. Notified Tiffany of time to hold Xarelto, she will inform patient when giving patient instructions for procedure.

## 2019-01-14 NOTE — Progress Notes (Signed)
START ON PATHWAY REGIMEN - Gastroesophageal     Administer weekly during RT:     Paclitaxel      Carboplatin   **Always confirm dose/schedule in your pharmacy ordering system**  Patient Characteristics: Esophageal & GE Junction, Adenocarcinoma, Preoperative or Nonsurgical Candidate (Clinical Staging), cT1, cN0, Unresectable/Nonsurgical Candidate Histology: Adenocarcinoma Disease Classification: GE Junction Therapeutic Status: Preoperative or Nonsurgical Candidate (Clinical Staging) AJCC Grade: GX AJCC 8 Stage Grouping: Unknown AJCC T Category: cTX AJCC N Category: cN0 AJCC M Category: cM0 Patient Characteristics: Unresectable/Nonsurgical Candidate Intent of Therapy: Curative Intent, Discussed with Patient

## 2019-01-17 NOTE — Progress Notes (Signed)
[name]    HEMATOLOGY ONCOLOGY PROGRESS NOTE  Date of service:  01/21/19    Patient Care Team: [name], NP as PCP - General (Family Medicine) [name], MD as Consulting Physician (Hematology and Oncology)  CHIEF COMPLAINTS/PURPOSE OF CONSULTATION:  F/u for metastatic lung cancer  DIAGNOSIS:   #1 Metastatic non-small cell lung cancer with bilateral lung nodules and large metastatic lesion in the left Ilium. #2 Barrett's esophagus with some evidence of intramucosal adenocarcinoma of the esophagus. 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 Also had c diff colitis - treated   Current Treatment  1) Active surveillance 2) Xgeva 181m Nickerson q4weeks for bone metastases. 3) Sandostatin q4weeks for diarrhea - immune colitis  Previous Treatment  1 Palliative radiation therapy to the large left ilium metastases 2. IV Nivolumab x 20ycles (discontinued due to likely immune colitis) 3. Xgeva 1283mSC q4weeks for bone metastases.   HISTORY OF PRESENTING ILLNESS: (plz see my previous consultation for details of initial presentation)  INTERVAL HISTORY:   Ms ToFollansbees here for her scheduled follow-up for metastatic lung cancer, and newly diagnosed adenocarcinoma of the esophagus. The patient's last visit with [place] on 01/09/19. The pt reports that she is doing well overall.   The pt reports that she has not developed any new concerns in the interim. She denies any concerns for infections at this time and is awaiting her first meeting with Rad Onc later this week. She endorses stable energy levels.   Lab results today (01/21/19) of CBC w/diff and CMP is as follows: all values are WNL except for HGB at 10.9, HCT at 35.5, CO2 at 20, Glucose at 131, Creatinine at 1.17, Albumin at 3.3, GFR at 45.  On review of systems, pt reports good energy levels, and denies concerns for infections, abdominal pains, and  any other symptoms.   MEDICAL HISTORY:  Past Medical History:  Diagnosis Date  . Barrett's esophagus   . Bone neoplasm 06/24/2015  . [facility]   metastatic poorly differentiated carcinoma. tumor left groin surgical removal with radiation tx.  . Cataract    BILATERAL  . Cigarette smoker two packs a day or less    Currently still smoking 2 PPD - Not interested in quitting at this time.  . Colitis 2017  . Colon polyps    hyperplastic, tubular adenomas, tubulovillous adenoma  . Cough, persistent    hx. lung cancer ? primary-being evaluated, unsure of primary site.  . Depression 06/24/2015  . Diverticulosis   . [place] lung ([facility]  . Endometriosis    Hysterectomy with BSO at age [age]rs  . Esophageal adenocarcinoma ([facility]   intramucosal  . Gastritis   . GERD (gastroesophageal reflux disease)   . H/O: pneumonia   . Hiatal hernia   . Hyperlipidemia   . Hypertension 06/24/2015   likely improved incidental to 40 lbs weight loss from her neoplasm. No Longer taking med for this as of 08-06-15  . IBS (irritable bowel syndrome)   . Pain    left hip-persistent"tumor of bone"-radiation tx. 10.  . Vitamin D deficiency disease    SURGICAL HISTORY: Past Surgical History:  Procedure Laterality Date  . BARTHOLIN GLAND CYST EXCISION  5243o ago   Does not want if it was an infected cyst or tumor. Was soon as delivery  . BIOPSY  01/02/2019   Procedure: BIOPSY;  Surgeon: PyHilarie Fredrickson  [name], MD;  Location: [facility];  Service: Gastroenterology;;  . CATARACT EXTRACTION    . COLONOSCOPY W/ POLYPECTOMY     multiple times - last done 09/2014 per patient.  . ESOPHAGOGASTRODUODENOSCOPY (EGD) WITH PROPOFOL N/A 08/11/2015   Procedure: ESOPHAGOGASTRODUODENOSCOPY (EGD) WITH PROPOFOL;  Surgeon: [name], MD;  Location: [facility];  Service: Gastroenterology;  Laterality: N/A;  . ESOPHAGOGASTRODUODENOSCOPY (EGD) WITH PROPOFOL N/A 01/02/2019   Procedure: ESOPHAGOGASTRODUODENOSCOPY (EGD) WITH  PROPOFOL;  Surgeon: [name], MD;  Location: [facility];  Service: Gastroenterology;  Laterality: N/A;  . FLEXIBLE SIGMOIDOSCOPY N/A 06/24/2017   Procedure: FLEXIBLE SIGMOIDOSCOPY;  Surgeon: Manus Gunning, MD;  Location: [facility];  Service: Gastroenterology;  Laterality: N/A;  . GANGLION CYST EXCISION    . KNEE ARTHROSCOPY  age about 9 yrs  . TONSILLECTOMY    . TOTAL ABDOMINAL HYSTERECTOMY W/ BILATERAL SALPINGOOPHORECTOMY  at age 72 yrs   For endometriosis    SOCIAL HISTORY: Social History   Socioeconomic History  . Marital status: Widowed    Spouse name: Not on file  . Number of children: 2  . Years of education: Not on file  . Highest education level: Not on file  Occupational History  . Not on file  Social Needs  . Financial resource strain: Not on file  . Food insecurity:    Worry: Not on file    Inability: Not on file  . Transportation needs:    Medical: Not on file    Non-medical: Not on file  Tobacco Use  . Smoking status: Former Smoker    Packs/day: 1.00    Years: 60.00    Pack years: 60.00    Types: Cigarettes    Last attempt to quit: 12/06/2015    Years since quitting: 3.1  . Smokeless tobacco: Never Used  Substance and Sexual Activity  . Alcohol use: No    Alcohol/week: 0.0 standard drinks  . Drug use: No  . Sexual activity: Never  Lifestyle  . Physical activity:    Days per week: Not on file    Minutes per session: Not on file  . Stress: Not on file  Relationships  . Social connections:    Talks on phone: Not on file    Gets together: Not on file    Attends religious service: Not on file    Active member of club or organization: Not on file    Attends meetings of clubs or organizations: Not on file    Relationship status: Not on file  . Intimate partner violence:    Fear of current or ex partner: Not on file    Emotionally abused: Not on file    Physically abused: Not on file    Forced sexual activity: Not on file  Other  Topics Concern  . Not on file  Social History Narrative  . Not on file    FAMILY HISTORY: Family History  Problem Relation Age of Onset  . Colon cancer Brother   . Colon cancer Brother   . Stroke Mother   . Colon cancer Father   . Breast cancer Daughter 33       ER/PR+ stage II    ALLERGIES:  is allergic to penicillins; remeron [mirtazapine]; and latex. patient wonders if she has a penicillin allergy but notes that she is uncertain about this.  MEDICATIONS:  Current Outpatient Medications  Medication Sig Dispense Refill  . amLODipine (NORVASC) 10 MG tablet TAKE 1 TABLET BY MOUTH EVERY DAY (  Patient taking differently: Take 10 mg by mouth daily. ) 90 tablet 0  . Cholecalciferol (VITAMIN D3) 25 MCG (1000 UT) CAPS Take 1,000 Units by mouth daily.    . citalopram (CELEXA) 20 MG tablet Take 20 mg by mouth daily.    [name] dexamethasone (DECADRON) 4 MG tablet Take 2 tablets (8 mg total) by mouth daily. Start the day after chemotherapy for 2 days. 30 tablet 1  . FLUoxetine (PROZAC) 20 MG capsule Take 1 capsule (20 mg total) by mouth daily. 90 capsule 3  . lidocaine-prilocaine (EMLA) cream Apply to affected area once 30 g 3  . LORazepam (ATIVAN) 0.5 MG tablet Take 1 tablet (0.5 mg total) by mouth every 6 (six) hours as needed (Nausea or vomiting). 30 tablet 0  . omeprazole (PRILOSEC) 40 MG capsule TAKE 1 CAPSULE BY MOUTH EVERY DAY (Patient taking differently: Take 40 mg by mouth 2 (two) times daily. ) 90 capsule 2  . ondansetron (ZOFRAN) 8 MG tablet Take 1 tablet (8 mg total) by mouth 2 (two) times daily as needed for refractory nausea / vomiting. Start on day 3 after chemo. 30 tablet 1  . Probiotic Product (PROBIOTIC PO) Take 1 capsule by mouth 2 (two) times daily.    . prochlorperazine (COMPAZINE) 10 MG tablet Take 1 tablet (10 mg total) by mouth every 6 (six) hours as needed (Nausea or vomiting). 30 tablet 1  . traZODone (DESYREL) 50 MG tablet TAKE 1 TABLET BY MOUTH EVERYDAY AT BEDTIME (Patient  taking differently: Take 50 mg by mouth at bedtime. ) 90 tablet 2  . XARELTO 20 MG TABS tablet TAKE 1 TABLET DAILY WITH SUPPER. DC LOVENOX AND START XARELTO AT THE SCHEDULED TIME AS INSTRUCTED. (Patient taking differently: Take 20 mg by mouth daily with supper. ) 90 tablet 1   No current facility-administered medications for this visit.    Facility-Administered Medications Ordered in Other Visits  Medication Dose Route Frequency Provider Last Rate Last Dose  . CARBOplatin (PARAPLATIN) 140 mg in sodium chloride 0.9 % 250 mL chemo infusion  140 mg Intravenous Once [name], MD      . heparin lock flush 100 unit/mL  500 Units Intracatheter Once PRN [name], MD      . PACLitaxel (TAXOL) 90 mg in sodium chloride 0.9 % 250 mL chemo infusion (</= 54m/m2)  50 mg/m2 (Treatment Plan Recorded) Intravenous Once KBrunetta Genera MD      . sodium chloride flush (NS) 0.9 % injection 10 mL  10 mL Intracatheter PRN KBrunetta Genera MD        REVIEW OF SYSTEMS:    A 10+ POINT REVIEW OF SYSTEMS WAS OBTAINED including neurology, dermatology, psychiatry, cardiac, respiratory, lymph, extremities, GI, GU, Musculoskeletal, constitutional, breasts, reproductive, HEENT.  All pertinent positives are noted in the HPI.  All others are negative.   PHYSICAL EXAMINATION: ECOG PERFORMANCE STATUS: 2 - Symptomatic, <50% confined to bed  There were no vitals filed for this visit. There were no vitals filed for this visit.. . Wt Readings from Last 3 Encounters:  01/21/19 150 lb 12 oz (68.4 kg)  01/09/19 150 lb 1.6 oz (68.1 kg)  01/02/19 155 lb (70.3 kg)    GENERAL:alert, in no acute distress and comfortable SKIN: no acute rashes, no significant lesions EYES: conjunctiva are pink and non-injected, sclera anicteric OROPHARYNX: MMM, no exudates, no oropharyngeal erythema or ulceration NECK: supple, no JVD LYMPH:  no palpable lymphadenopathy in the cervical, axillary or inguinal  regions LUNGS: clear to auscultation b/l with normal respiratory effort HEART: regular rate & rhythm ABDOMEN:  normoactive bowel sounds , non tender, not distended. No palpable hepatosplenomegaly.  Extremity: no pedal edema PSYCH: alert & oriented x 3 with fluent speech NEURO: no focal motor/sensory deficits   LABORATORY DATA:  I have reviewed the data as listed  . CBC Latest Ref Rng & Units 01/21/2019 01/09/2019 12/12/2018  WBC 4.0 - 10.5 K/uL 6.5 6.9 7.7  Hemoglobin 12.0 - 15.0 g/dL 10.9(L) 10.5(L) 10.5(L)  Hematocrit 36.0 - 46.0 % 35.5(L) 33.8(L) 33.9(L)  Platelets 150 - 400 K/uL 215 213 199    . CMP Latest Ref Rng & Units 01/21/2019 01/09/2019 12/12/2018  Glucose 70 - 99 mg/dL 131(H) 125(H) 174(H)  BUN 8 - 23 mg/dL 19 17 22   Creatinine 0.44 - 1.00 mg/dL 1.17(H) 1.31(H) 1.39(H)  Sodium 135 - 145 mmol/L 140 140 139  Potassium 3.5 - 5.1 mmol/L 3.9 4.0 5.0  Chloride 98 - 111 mmol/L 109 106 106  CO2 22 - 32 mmol/L 20(L) 25 24  Calcium 8.9 - 10.3 mg/dL 9.3 8.8(L) 8.7(L)  Total Protein 6.5 - 8.1 g/dL 7.6 7.6 7.5  Total Bilirubin 0.3 - 1.2 mg/dL 0.3 0.3 0.4  Alkaline Phos 38 - 126 U/L 69 61 62  AST 15 - 41 U/L 19 24 18   ALT 0 - 44 U/L 10 13 9        01/02/19 Esophagus Biopsy:    RADIOGRAPHIC STUDIES:  .No results found.  ASSESSMENT & PLAN:   78 y.o. female with  #1 Metastatic poorly differentiated carcinoma with likely lung primary non-small cell lung cancer.   CT of the head with and without contrast showed no evidence of metastatic disease. EGFR blood test mutation analysis negative. CT chest abdomen pelvis 04/19/2016 shows no evidence of disease progression. Patient tolerated Nivolumab very well but was discontinued when she developed grade 2 Immune colitis. Has been off Nivolumab for >6 months  CT chest abdomen pelvis on 06/24/2016 shows no evidence of new disease or progression of metastatic disease. CT chest abdomen pelvis 09/06/2016 shows 1. Mixed interval response to  therapy. 2. There is a new left ventral chest wall lesion deep to the pectoralis musculature worrisome for metastatic disease. 3. Posterior lower lobe nodular densities are identified which may reflect areas of pulmonary metastasis. 4. Interval decrease in size of destructive lesion involving the left iliac bone.  CT chest abd pelvis 12/08/2016: Cystic mass involving the left ventral chest wall has resolved in the interval. Likely was a hematoma due to trauma. Interval increase in size of pleural base mass overlying the posterior and inferior left lower lobe. There is also a new left pleural effusion identified.  CT chest 02/01/2017: Residual irregular soft tissue thickening/volume loss and trace left pleural fluid at the base of the left hemithorax, overall improved in appearance from 12/08/2016. No measurable lesion.  CT chest 05/29/2017 shows no residual pleural based mass or significant pleural effusion in the left hemithorax. No evidence of thoracic metastatic disease. No evidence of progressive metastatic disease within the abdomen or pelvis. Mixed lytic and blastic lesion involving the left iliac bone and associated pathologic fracture are unchanged.   CT CAP 09/14/17 shows no new changes. She does have slight displacement of her fractured left iliac bone. Evidence of stable disease.   CT CAP 01/04/2018- No new or progressive metastatic disease. Stable large left iliac bone metastasis with associated chronic pathologic fracture.   CT chest/abd/pelvis done on 04/26/18  revealed Stable exam.  No new or progressive interval findings.  07/19/18 CT C/A/P revealed Stable exam.  No new or progressive interval findings. Large destructive left iliac lesion is similar to prior. Aortic Atherosclerosis and Emphysema.    11/06/18 CT C/A/P revealed Similar appearance of large mixed lytic and sclerotic lesion in the left ilium. No new metastatic lesions are otherwise noted elsewhere in the chest, abdomen or pelvis.  2. Interval development of thickening of the distal third of the esophagus. This is nonspecific, and could be related to underlying reflux esophagitis. However, if there is any clinical concern for Barrett's metaplasia or esophageal neoplasia, further evaluation with nonemergent endoscopy could be considered. 3. Aortic atherosclerosis, in addition to left main coronary artery disease. Assessment for potential risk factor modification, dietary therapy or pharmacologic therapy may be warranted, if clinically Indicated. 4. Diffuse bronchial wall thickening with mild to moderate centrilobular and paraseptal emphysema; imaging findings suggestive of underlying COPD. 5. Additional incidental findings, as above.   #2 Newly diagnosed Adenocarcinoma of the esophagus  Barrett's esophagus 4cms in the distal esophagus with low and high-grade dysplasia  01/02/19 Surgical pathology revealed adenocarcinoma of the esophagus   #3 diarrhea-  now resolved was previously. 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 She was previously on on Lialda, budesonide,probiotics and lomotil but not currently taking any of these. Plan -Continue Sandostatin every 4 weeks   #4 h/o diverticulitis and c dff colitis - now resolved Plan  -continue on sandostatin today and q4weeks.  -continue on Lialda  #5 DVT and PE  -continue on Xarelto - no issues with bleeding   PLAN: -Discussed pt labwork today, 01/21/19; blood counts and chemistries are stable -Proceed with Peg tube placement on 01/24/19, and hold Xarelto 48 hours prior to placement -Proceed with PET/CT on 01/25/19 -Establish care with Rad Onc on 01/23/19 for concurrent RT -The pt has no prohibitive toxicities from beginning C1 Carboplatin and Taxol at this time.    -Previously discussed the NCCN guidelines with the pt and my recommendation to pursue concurrent  chemoradiation, not surgery, in the setting of the patient's stable metastatic lung cancer. The pt agrees with this recommendation and is also opposed to surgery herself. -Continue Xgeva and Sandostatin every 4 weeks -Continue 2000 units Vitamin D every day, with dose adjustment to maintain 25OH Vit D levels of 40-60 -Trazodone for sleep difficulty   Plz schedule C2 of weekly carboplatin/taxol in 1 weeks with labs and MD (for toxicity check)  All questions were answered. The patient knows to call the clinic with any problems, questions or concerns.  The total time spent in the appt was 25 minutes and more than 50% was on counseling and direct patient cares.   Sullivan Lone MD MS Hematology/Oncology Physician [facility]  (Office): [phone] (Work cell): [phone] (Fax): [phone]  I, Baldwin [place], am acting as a scribe for Dr. Sullivan Lone.   .I have reviewed the above documentation for accuracy and completeness, and I agree with the above. [name] MD

## 2019-01-21 ENCOUNTER — Other Ambulatory Visit: Payer: Medicare Other

## 2019-01-21 ENCOUNTER — Telehealth: Payer: Self-pay | Admitting: Hematology

## 2019-01-21 ENCOUNTER — Encounter: Payer: Self-pay | Admitting: Hematology

## 2019-01-21 ENCOUNTER — Inpatient Hospital Stay: Payer: Medicare Other

## 2019-01-21 ENCOUNTER — Inpatient Hospital Stay (HOSPITAL_BASED_OUTPATIENT_CLINIC_OR_DEPARTMENT_OTHER): Payer: Medicare Other | Admitting: Hematology

## 2019-01-21 VITALS — BP 148/80 | HR 89 | Temp 98.1°F | Resp 16 | Wt 150.8 lb

## 2019-01-21 DIAGNOSIS — I82401 Acute embolism and thrombosis of unspecified deep veins of right lower extremity: Secondary | ICD-10-CM | POA: Diagnosis not present

## 2019-01-21 DIAGNOSIS — Z5111 Encounter for antineoplastic chemotherapy: Secondary | ICD-10-CM

## 2019-01-21 DIAGNOSIS — C7951 Secondary malignant neoplasm of bone: Secondary | ICD-10-CM | POA: Diagnosis not present

## 2019-01-21 DIAGNOSIS — Z7189 Other specified counseling: Secondary | ICD-10-CM

## 2019-01-21 DIAGNOSIS — Z87891 Personal history of nicotine dependence: Secondary | ICD-10-CM

## 2019-01-21 DIAGNOSIS — C160 Malignant neoplasm of cardia: Secondary | ICD-10-CM

## 2019-01-21 DIAGNOSIS — C3491 Malignant neoplasm of unspecified part of right bronchus or lung: Secondary | ICD-10-CM

## 2019-01-21 DIAGNOSIS — C3490 Malignant neoplasm of unspecified part of unspecified bronchus or lung: Secondary | ICD-10-CM

## 2019-01-21 DIAGNOSIS — I2699 Other pulmonary embolism without acute cor pulmonale: Secondary | ICD-10-CM

## 2019-01-21 DIAGNOSIS — Z95828 Presence of other vascular implants and grafts: Secondary | ICD-10-CM

## 2019-01-21 DIAGNOSIS — C155 Malignant neoplasm of lower third of esophagus: Secondary | ICD-10-CM

## 2019-01-21 LAB — CMP (CANCER CENTER ONLY)
ALT: 10 U/L (ref 0–44)
AST: 19 U/L (ref 15–41)
Albumin: 3.3 g/dL — ABNORMAL LOW (ref 3.5–5.0)
Alkaline Phosphatase: 69 U/L (ref 38–126)
Anion gap: 11 (ref 5–15)
BUN: 19 mg/dL (ref 8–23)
CHLORIDE: 109 mmol/L (ref 98–111)
CO2: 20 mmol/L — ABNORMAL LOW (ref 22–32)
Calcium: 9.3 mg/dL (ref 8.9–10.3)
Creatinine: 1.17 mg/dL — ABNORMAL HIGH (ref 0.44–1.00)
GFR, Est AFR Am: 52 mL/min — ABNORMAL LOW (ref >60)
GFR, Estimated: 45 mL/min — ABNORMAL LOW (ref >60)
Glucose, Bld: 131 mg/dL — ABNORMAL HIGH (ref 70–99)
Potassium: 3.9 mmol/L (ref 3.5–5.1)
Sodium: 140 mmol/L (ref 135–145)
Total Bilirubin: 0.3 mg/dL (ref 0.3–1.2)
Total Protein: 7.6 g/dL (ref 6.5–8.1)

## 2019-01-21 LAB — CBC WITH DIFFERENTIAL/PLATELET
Abs Immature Granulocytes: 0.02 10*3/uL (ref 0.00–0.07)
BASOS PCT: 1 %
Basophils Absolute: 0.1 10*3/uL (ref 0.0–0.1)
Eosinophils Absolute: 0.2 10*3/uL (ref 0.0–0.5)
Eosinophils Relative: 3 %
HCT: 35.5 % — ABNORMAL LOW (ref 36.0–46.0)
Hemoglobin: 10.9 g/dL — ABNORMAL LOW (ref 12.0–15.0)
Immature Granulocytes: 0 %
Lymphocytes Relative: 24 %
Lymphs Abs: 1.6 10*3/uL (ref 0.7–4.0)
MCH: 26.3 pg (ref 26.0–34.0)
MCHC: 30.7 g/dL (ref 30.0–36.0)
MCV: 85.7 fL (ref 80.0–100.0)
Monocytes Absolute: 0.5 10*3/uL (ref 0.1–1.0)
Monocytes Relative: 8 %
Neutro Abs: 4.1 10*3/uL (ref 1.7–7.7)
Neutrophils Relative %: 64 %
Platelets: 215 10*3/uL (ref 150–400)
RBC: 4.14 MIL/uL (ref 3.87–5.11)
RDW: 14.5 % (ref 11.5–15.5)
WBC: 6.5 10*3/uL (ref 4.0–10.5)
nRBC: 0 % (ref 0.0–0.2)

## 2019-01-21 MED ORDER — DIPHENHYDRAMINE HCL 50 MG/ML IJ SOLN
50.0000 mg | Freq: Once | INTRAMUSCULAR | Status: AC
  Administered 2019-01-21: 50 mg via INTRAVENOUS

## 2019-01-21 MED ORDER — SODIUM CHLORIDE 0.9 % IV SOLN
Freq: Once | INTRAVENOUS | Status: DC | PRN
  Filled 2019-01-21: qty 250

## 2019-01-21 MED ORDER — SODIUM CHLORIDE 0.9 % IV SOLN
50.0000 mg/m2 | Freq: Once | INTRAVENOUS | Status: AC
  Administered 2019-01-21: 90 mg via INTRAVENOUS
  Filled 2019-01-21: qty 15

## 2019-01-21 MED ORDER — FAMOTIDINE IN NACL 20-0.9 MG/50ML-% IV SOLN
20.0000 mg | Freq: Once | INTRAVENOUS | Status: AC
  Administered 2019-01-21: 20 mg via INTRAVENOUS

## 2019-01-21 MED ORDER — FAMOTIDINE IN NACL 20-0.9 MG/50ML-% IV SOLN
INTRAVENOUS | Status: AC
  Filled 2019-01-21: qty 50

## 2019-01-21 MED ORDER — ALBUTEROL SULFATE (2.5 MG/3ML) 0.083% IN NEBU
2.5000 mg | INHALATION_SOLUTION | Freq: Once | RESPIRATORY_TRACT | Status: DC | PRN
  Filled 2019-01-21: qty 3

## 2019-01-21 MED ORDER — SODIUM CHLORIDE 0.9% FLUSH
10.0000 mL | INTRAVENOUS | Status: DC | PRN
  Administered 2019-01-21: 10 mL
  Filled 2019-01-21: qty 10

## 2019-01-21 MED ORDER — SODIUM CHLORIDE 0.9 % IV SOLN
Freq: Once | INTRAVENOUS | Status: AC
  Administered 2019-01-21: 09:00:00 via INTRAVENOUS
  Filled 2019-01-21: qty 250

## 2019-01-21 MED ORDER — DENOSUMAB 120 MG/1.7ML ~~LOC~~ SOLN
120.0000 mg | Freq: Once | SUBCUTANEOUS | Status: AC
  Administered 2019-01-21: 120 mg via SUBCUTANEOUS

## 2019-01-21 MED ORDER — DIPHENHYDRAMINE HCL 50 MG/ML IJ SOLN: 25.0000 mg | Freq: Once | INTRAMUSCULAR | Status: DC | PRN

## 2019-01-21 MED ORDER — SODIUM CHLORIDE 0.9% FLUSH
10.0000 mL | Freq: Once | INTRAVENOUS | Status: AC
  Administered 2019-01-21: 10 mL
  Filled 2019-01-21: qty 10

## 2019-01-21 MED ORDER — DENOSUMAB 120 MG/1.7ML ~~LOC~~ SOLN
SUBCUTANEOUS | Status: AC
  Filled 2019-01-21: qty 1.7

## 2019-01-21 MED ORDER — SODIUM CHLORIDE 0.9 % IV SOLN
20.0000 mg | Freq: Once | INTRAVENOUS | Status: AC
  Administered 2019-01-21: 20 mg via INTRAVENOUS
  Filled 2019-01-21: qty 20

## 2019-01-21 MED ORDER — SODIUM CHLORIDE 0.9 % IV SOLN
135.2000 mg | Freq: Once | INTRAVENOUS | Status: AC
  Administered 2019-01-21: 140 mg via INTRAVENOUS
  Filled 2019-01-21: qty 14

## 2019-01-21 MED ORDER — HEPARIN SOD (PORK) LOCK FLUSH 100 UNIT/ML IV SOLN
500.0000 [IU] | Freq: Once | INTRAVENOUS | Status: AC | PRN
  Administered 2019-01-21: 500 [IU]
  Filled 2019-01-21: qty 5

## 2019-01-21 MED ORDER — METHYLPREDNISOLONE SODIUM SUCC 125 MG IJ SOLR: 125.0000 mg | Freq: Once | INTRAMUSCULAR | Status: DC | PRN

## 2019-01-21 MED ORDER — PALONOSETRON HCL INJECTION 0.25 MG/5ML
0.2500 mg | Freq: Once | INTRAVENOUS | Status: AC
  Administered 2019-01-21: 0.25 mg via INTRAVENOUS

## 2019-01-21 MED ORDER — DIPHENHYDRAMINE HCL 50 MG/ML IJ SOLN: 50.0000 mg | Freq: Once | INTRAMUSCULAR | Status: DC | PRN

## 2019-01-21 MED ORDER — PALONOSETRON HCL INJECTION 0.25 MG/5ML
INTRAVENOUS | Status: AC
  Filled 2019-01-21: qty 5

## 2019-01-21 MED ORDER — DIPHENHYDRAMINE HCL 50 MG/ML IJ SOLN
INTRAMUSCULAR | Status: AC
  Filled 2019-01-21: qty 1

## 2019-01-21 MED ORDER — EPINEPHRINE HCL 0.1 MG/ML IJ SOLN: 0.2500 mg | Freq: Once | INTRAMUSCULAR | Status: DC | PRN

## 2019-01-21 NOTE — Progress Notes (Signed)
Met w/ pt to introduce myself as her Arboriculturist.  Unfortunately there aren't any foundations offering copay assistance for her Dx and the type of ins she has.  I offered the [place] and gave her the income requirement.  She would like to apply so she will bring her proof of income on 01/23/19.  If approved I will give her an expense sheet

## 2019-01-21 NOTE — Telephone Encounter (Signed)
No los per 2/17 los

## 2019-01-21 NOTE — Patient Instructions (Addendum)
[facility] Discharge Instructions for Patients Receiving Chemotherapy  Today you received the following chemotherapy agents Denosumab (XGEVA), Paclitaxel (TAXOL) & Carboplatin (PARAPLATIN).  To help prevent nausea and vomiting after your treatment, we encourage you to take your nausea medication as prescribed.   If you develop nausea and vomiting that is not controlled by your nausea medication, call the clinic.   BELOW ARE SYMPTOMS THAT SHOULD BE REPORTED IMMEDIATELY:  *FEVER GREATER THAN 100.5 F  *CHILLS WITH OR WITHOUT FEVER  NAUSEA AND VOMITING THAT IS NOT CONTROLLED WITH YOUR NAUSEA MEDICATION  *UNUSUAL SHORTNESS OF BREATH  *UNUSUAL BRUISING OR BLEEDING  TENDERNESS IN MOUTH AND THROAT WITH OR WITHOUT PRESENCE OF ULCERS  *URINARY PROBLEMS  *BOWEL PROBLEMS  UNUSUAL RASH Items with * indicate a potential emergency and should be followed up as soon as possible.  Feel free to call the clinic should you have any questions or concerns. The clinic phone number is [phone].  Please show the [facility] at check-in to the Emergency Department and triage nurse.  Denosumab injection What is this medicine? DENOSUMAB (den oh sue mab) slows bone breakdown. Prolia is used to treat osteoporosis in women after menopause and in men, and in people who are taking corticosteroids for 6 months or more. Delton See is used to treat a high calcium level due to cancer and to prevent bone fractures and other bone problems caused by multiple myeloma or cancer bone metastases. Delton See is also used to treat giant cell tumor of the bone. This medicine may be used for other purposes; ask your health care provider or pharmacist if you have questions. COMMON BRAND NAME(S): Prolia, XGEVA What should I tell my health care provider before I take this medicine? They need to know if you have any of these conditions: -dental disease -having surgery or tooth extraction -infection -kidney  disease -low levels of calcium or Vitamin D in the blood -malnutrition -on hemodialysis -skin conditions or sensitivity -thyroid or parathyroid disease -an unusual reaction to denosumab, other medicines, foods, dyes, or preservatives -pregnant or trying to get pregnant -breast-feeding How should I use this medicine? This medicine is for injection under the skin. It is given by a health care professional in a hospital or clinic setting. A special MedGuide will be given to you before each treatment. Be sure to read this information carefully each time. For Prolia, talk to your pediatrician regarding the use of this medicine in children. Special care may be needed. For Delton See, talk to your pediatrician regarding the use of this medicine in children. While this drug may be prescribed for children as young as 13 years for selected conditions, precautions do apply. Overdosage: If you think you have taken too much of this medicine contact a poison control center or emergency room at once. NOTE: This medicine is only for you. Do not share this medicine with others. What if I miss a dose? It is important not to miss your dose. Call your doctor or health care professional if you are unable to keep an appointment. What may interact with this medicine? Do not take this medicine with any of the following medications: -other medicines containing denosumab This medicine may also interact with the following medications: -medicines that lower your chance of fighting infection -steroid medicines like prednisone or cortisone This list may not describe all possible interactions. Give your health care provider a list of all the medicines, herbs, non-prescription drugs, or dietary supplements you use. Also tell them  if you smoke, drink alcohol, or use illegal drugs. Some items may interact with your medicine. What should I watch for while using this medicine? Visit your doctor or health care professional for  regular checks on your progress. Your doctor or health care professional may order blood tests and other tests to see how you are doing. Call your doctor or health care professional for advice if you get a fever, chills or sore throat, or other symptoms of a cold or flu. Do not treat yourself. This drug may decrease your body's ability to fight infection. Try to avoid being around people who are sick. You should make sure you get enough calcium and vitamin D while you are taking this medicine, unless your doctor tells you not to. Discuss the foods you eat and the vitamins you take with your health care professional. See your dentist regularly. Brush and floss your teeth as directed. Before you have any dental work done, tell your dentist you are receiving this medicine. Do not become pregnant while taking this medicine or for 5 months after stopping it. Talk with your doctor or health care professional about your birth control options while taking this medicine. Women should inform their doctor if they wish to become pregnant or think they might be pregnant. There is a potential for serious side effects to an unborn child. Talk to your health care professional or pharmacist for more information. What side effects may I notice from receiving this medicine? Side effects that you should report to your doctor or health care professional as soon as possible: -allergic reactions like skin rash, itching or hives, swelling of the face, lips, or tongue -bone pain -breathing problems -dizziness -jaw pain, especially after dental work -redness, blistering, peeling of the skin -signs and symptoms of infection like fever or chills; cough; sore throat; pain or trouble passing urine -signs of low calcium like fast heartbeat, muscle cramps or muscle pain; pain, tingling, numbness in the hands or feet; seizures -unusual bleeding or bruising -unusually weak or tired Side effects that usually do not require medical  attention (report to your doctor or health care professional if they continue or are bothersome): -constipation -diarrhea -headache -joint pain -loss of appetite -muscle pain -runny nose -tiredness -upset stomach This list may not describe all possible side effects. Call your doctor for medical advice about side effects. You may report side effects to FDA at 1-800-FDA-1088. Where should I keep my medicine? This medicine is only given in a clinic, doctor's office, or other health care setting and will not be stored at home. NOTE: This sheet is a summary. It may not cover all possible information. If you have questions about this medicine, talk to your doctor, pharmacist, or health care provider.  2019 Elsevier/Gold Standard (2018-03-30 16:10:44)   Paclitaxel injection What is this medicine? PACLITAXEL (PAK li TAX el) is a chemotherapy drug. It targets fast dividing cells, like cancer cells, and causes these cells to die. This medicine is used to treat ovarian cancer, breast cancer, lung cancer, Kaposi's sarcoma, and other cancers. This medicine may be used for other purposes; ask your health care provider or pharmacist if you have questions. COMMON BRAND NAME(S): Onxol, Taxol What should I tell my health care provider before I take this medicine? They need to know if you have any of these conditions: -history of irregular heartbeat -liver disease -low blood counts, like low white cell, platelet, or red cell counts -lung or breathing disease, like asthma -tingling of  the fingers or toes, or other nerve disorder -an unusual or allergic reaction to paclitaxel, alcohol, polyoxyethylated castor oil, other chemotherapy, other medicines, foods, dyes, or preservatives -pregnant or trying to get pregnant -breast-feeding How should I use this medicine? This drug is given as an infusion into a vein. It is administered in a hospital or clinic by a specially trained health care  professional. Talk to your pediatrician regarding the use of this medicine in children. Special care may be needed. Overdosage: If you think you have taken too much of this medicine contact a poison control center or emergency room at once. NOTE: This medicine is only for you. Do not share this medicine with others. What if I miss a dose? It is important not to miss your dose. Call your doctor or health care professional if you are unable to keep an appointment. What may interact with this medicine? Do not take this medicine with any of the following medications: -disulfiram -metronidazole This medicine may also interact with the following medications: -antiviral medicines for hepatitis, HIV or AIDS -certain antibiotics like erythromycin and clarithromycin -certain medicines for fungal infections like ketoconazole and itraconazole -certain medicines for seizures like carbamazepine, phenobarbital, phenytoin -gemfibrozil -nefazodone -rifampin -St. John's wort This list may not describe all possible interactions. Give your health care provider a list of all the medicines, herbs, non-prescription drugs, or dietary supplements you use. Also tell them if you smoke, drink alcohol, or use illegal drugs. Some items may interact with your medicine. What should I watch for while using this medicine? Your condition will be monitored carefully while you are receiving this medicine. You will need important blood work done while you are taking this medicine. This medicine can cause serious allergic reactions. To reduce your risk you will need to take other medicine(s) before treatment with this medicine. If you experience allergic reactions like skin rash, itching or hives, swelling of the face, lips, or tongue, tell your doctor or health care professional right away. In some cases, you may be given additional medicines to help with side effects. Follow all directions for their use. This drug may make you  feel generally unwell. This is not uncommon, as chemotherapy can affect healthy cells as well as cancer cells. Report any side effects. Continue your course of treatment even though you feel ill unless your doctor tells you to stop. Call your doctor or health care professional for advice if you get a fever, chills or sore throat, or other symptoms of a cold or flu. Do not treat yourself. This drug decreases your body's ability to fight infections. Try to avoid being around people who are sick. This medicine may increase your risk to bruise or bleed. Call your doctor or health care professional if you notice any unusual bleeding. Be careful brushing and flossing your teeth or using a toothpick because you may get an infection or bleed more easily. If you have any dental work done, tell your dentist you are receiving this medicine. Avoid taking products that contain aspirin, acetaminophen, ibuprofen, naproxen, or ketoprofen unless instructed by your doctor. These medicines may hide a fever. Do not become pregnant while taking this medicine. Women should inform their doctor if they wish to become pregnant or think they might be pregnant. There is a potential for serious side effects to an unborn child. Talk to your health care professional or pharmacist for more information. Do not breast-feed an infant while taking this medicine. Men are advised not to father a  child while receiving this medicine. This product may contain alcohol. Ask your pharmacist or healthcare provider if this medicine contains alcohol. Be sure to tell all healthcare providers you are taking this medicine. Certain medicines, like metronidazole and disulfiram, can cause an unpleasant reaction when taken with alcohol. The reaction includes flushing, headache, nausea, vomiting, sweating, and increased thirst. The reaction can last from 30 minutes to several hours. What side effects may I notice from receiving this medicine? Side effects that  you should report to your doctor or health care professional as soon as possible: -allergic reactions like skin rash, itching or hives, swelling of the face, lips, or tongue -breathing problems -changes in vision -fast, irregular heartbeat -high or low blood pressure -mouth sores -pain, tingling, numbness in the hands or feet -signs of decreased platelets or bleeding - bruising, pinpoint red spots on the skin, black, tarry stools, blood in the urine -signs of decreased red blood cells - unusually weak or tired, feeling faint or lightheaded, falls -signs of infection - fever or chills, cough, sore throat, pain or difficulty passing urine -signs and symptoms of liver injury like dark yellow or brown urine; general ill feeling or flu-like symptoms; light-colored stools; loss of appetite; nausea; right upper belly pain; unusually weak or tired; yellowing of the eyes or skin -swelling of the ankles, feet, hands -unusually slow heartbeat Side effects that usually do not require medical attention (report to your doctor or health care professional if they continue or are bothersome): -diarrhea -hair loss -loss of appetite -muscle or joint pain -nausea, vomiting -pain, redness, or irritation at site where injected -tiredness This list may not describe all possible side effects. Call your doctor for medical advice about side effects. You may report side effects to FDA at 1-800-FDA-1088. Where should I keep my medicine? This drug is given in a hospital or clinic and will not be stored at home. NOTE: This sheet is a summary. It may not cover all possible information. If you have questions about this medicine, talk to your doctor, pharmacist, or health care provider.  2019 Elsevier/Gold Standard (2017-07-25 13:14:55)  Carboplatin injection What is this medicine? CARBOPLATIN (KAR boe pla tin) is a chemotherapy drug. It targets fast dividing cells, like cancer cells, and causes these cells to die.  This medicine is used to treat ovarian cancer and many other cancers. This medicine may be used for other purposes; ask your health care provider or pharmacist if you have questions. COMMON BRAND NAME(S): Paraplatin What should I tell my health care provider before I take this medicine? They need to know if you have any of these conditions: -blood disorders -hearing problems -kidney disease -recent or ongoing radiation therapy -an unusual or allergic reaction to carboplatin, cisplatin, other chemotherapy, other medicines, foods, dyes, or preservatives -pregnant or trying to get pregnant -breast-feeding How should I use this medicine? This drug is usually given as an infusion into a vein. It is administered in a hospital or clinic by a specially trained health care professional. Talk to your pediatrician regarding the use of this medicine in children. Special care may be needed. Overdosage: If you think you have taken too much of this medicine contact a poison control center or emergency room at once. NOTE: This medicine is only for you. Do not share this medicine with others. What if I miss a dose? It is important not to miss a dose. Call your doctor or health care professional if you are unable to keep an appointment.  What may interact with this medicine? -medicines for seizures -medicines to increase blood counts like filgrastim, pegfilgrastim, sargramostim -some antibiotics like amikacin, gentamicin, neomycin, streptomycin, tobramycin -vaccines Talk to your doctor or health care professional before taking any of these medicines: -acetaminophen -aspirin -ibuprofen -ketoprofen -naproxen This list may not describe all possible interactions. Give your health care provider a list of all the medicines, herbs, non-prescription drugs, or dietary supplements you use. Also tell them if you smoke, drink alcohol, or use illegal drugs. Some items may interact with your medicine. What should I  watch for while using this medicine? Your condition will be monitored carefully while you are receiving this medicine. You will need important blood work done while you are taking this medicine. This drug may make you feel generally unwell. This is not uncommon, as chemotherapy can affect healthy cells as well as cancer cells. Report any side effects. Continue your course of treatment even though you feel ill unless your doctor tells you to stop. In some cases, you may be given additional medicines to help with side effects. Follow all directions for their use. Call your doctor or health care professional for advice if you get a fever, chills or sore throat, or other symptoms of a cold or flu. Do not treat yourself. This drug decreases your body's ability to fight infections. Try to avoid being around people who are sick. This medicine may increase your risk to bruise or bleed. Call your doctor or health care professional if you notice any unusual bleeding. Be careful brushing and flossing your teeth or using a toothpick because you may get an infection or bleed more easily. If you have any dental work done, tell your dentist you are receiving this medicine. Avoid taking products that contain aspirin, acetaminophen, ibuprofen, naproxen, or ketoprofen unless instructed by your doctor. These medicines may hide a fever. Do not become pregnant while taking this medicine. Women should inform their doctor if they wish to become pregnant or think they might be pregnant. There is a potential for serious side effects to an unborn child. Talk to your health care professional or pharmacist for more information. Do not breast-feed an infant while taking this medicine. What side effects may I notice from receiving this medicine? Side effects that you should report to your doctor or health care professional as soon as possible: -allergic reactions like skin rash, itching or hives, swelling of the face, lips, or  tongue -signs of infection - fever or chills, cough, sore throat, pain or difficulty passing urine -signs of decreased platelets or bleeding - bruising, pinpoint red spots on the skin, black, tarry stools, nosebleeds -signs of decreased red blood cells - unusually weak or tired, fainting spells, lightheadedness -breathing problems -changes in hearing -changes in vision -chest pain -high blood pressure -low blood counts - This drug may decrease the number of white blood cells, red blood cells and platelets. You may be at increased risk for infections and bleeding. -nausea and vomiting -pain, swelling, redness or irritation at the injection site -pain, tingling, numbness in the hands or feet -problems with balance, talking, walking -trouble passing urine or change in the amount of urine Side effects that usually do not require medical attention (report to your doctor or health care professional if they continue or are bothersome): -hair loss -loss of appetite -metallic taste in the mouth or changes in taste This list may not describe all possible side effects. Call your doctor for medical advice about side effects. You  may report side effects to FDA at 1-800-FDA-1088. Where should I keep my medicine? This drug is given in a hospital or clinic and will not be stored at home. NOTE: This sheet is a summary. It may not cover all possible information. If you have questions about this medicine, talk to your doctor, pharmacist, or health care provider.  2019 Elsevier/Gold Standard (2008-02-26 14:38:05)

## 2019-01-22 ENCOUNTER — Other Ambulatory Visit: Payer: Self-pay | Admitting: Radiology

## 2019-01-22 ENCOUNTER — Telehealth: Payer: Self-pay | Admitting: *Deleted

## 2019-01-22 NOTE — Telephone Encounter (Signed)
Contacted patient to follow up on first time chemo. Patient received first time Taxol and Carboplatin 2/17 and tolerated well per infusion nurse. Patient states doing well. Encouraged to drink plenty of fluids, patient verbalized understanding. Reviewed patient's upcoming appts scheduled in [facility] and for G-tube insertion with patient. Patient verbalized understanding of times.

## 2019-01-23 ENCOUNTER — Encounter: Payer: Self-pay | Admitting: Radiation Oncology

## 2019-01-23 ENCOUNTER — Ambulatory Visit
Admission: RE | Admit: 2019-01-23 | Discharge: 2019-01-23 | Disposition: A | Discharge status: Home / Self Care | Payer: Medicare Other | Source: Ambulatory Visit | Attending: Radiation Oncology | Admitting: Radiation Oncology

## 2019-01-23 ENCOUNTER — Other Ambulatory Visit: Payer: Self-pay

## 2019-01-23 ENCOUNTER — Other Ambulatory Visit: Payer: Self-pay | Admitting: Radiology

## 2019-01-23 VITALS — BP 140/71 | HR 85 | Temp 98.1°F | Resp 18 | Ht 68.5 in | Wt 149.8 lb

## 2019-01-23 DIAGNOSIS — C155 Malignant neoplasm of lower third of esophagus: Secondary | ICD-10-CM | POA: Diagnosis not present

## 2019-01-23 DIAGNOSIS — E785 Hyperlipidemia, unspecified: Secondary | ICD-10-CM | POA: Diagnosis not present

## 2019-01-23 DIAGNOSIS — C3490 Malignant neoplasm of unspecified part of unspecified bronchus or lung: Secondary | ICD-10-CM | POA: Diagnosis not present

## 2019-01-23 DIAGNOSIS — Z923 Personal history of irradiation: Secondary | ICD-10-CM | POA: Insufficient documentation

## 2019-01-23 DIAGNOSIS — Z800 Family history of malignant neoplasm of digestive organs: Secondary | ICD-10-CM | POA: Diagnosis not present

## 2019-01-23 DIAGNOSIS — F17210 Nicotine dependence, cigarettes, uncomplicated: Secondary | ICD-10-CM | POA: Insufficient documentation

## 2019-01-23 DIAGNOSIS — Z7901 Long term (current) use of anticoagulants: Secondary | ICD-10-CM | POA: Diagnosis not present

## 2019-01-23 DIAGNOSIS — I10 Essential (primary) hypertension: Secondary | ICD-10-CM | POA: Diagnosis not present

## 2019-01-23 DIAGNOSIS — C160 Malignant neoplasm of cardia: Secondary | ICD-10-CM

## 2019-01-23 DIAGNOSIS — Z79899 Other long term (current) drug therapy: Secondary | ICD-10-CM | POA: Insufficient documentation

## 2019-01-23 DIAGNOSIS — Z85830 Personal history of malignant neoplasm of bone: Secondary | ICD-10-CM | POA: Diagnosis not present

## 2019-01-23 NOTE — Progress Notes (Addendum)
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  The mass was partially obstructing and partially circumferential.  A single 8 mm mucosal nodule, probable separate foci of malignancy, was found in the lower third of the esophagus  CT CAP 11/06/2018: Prominent but non-enlarged distal paraesophageal lymph nodes.  Thickening of the distal third of the esophagus.   Cindy Byrd presented   Biopsies of Esophagus 01/02/2019   Past/Anticipated interventions by surgeon, if any:   Past/Anticipated interventions by medical oncology, if any:  Dr. Irene Limbo 01/21/2019 - Barrett's esophagus 4cm in the distal esophagus with low and high grade dysplasia. -Proceed with PEG tube placement 01/24/2019 -Establish care with Rad Onc for concurrent RT -Previously discussed the NCCN guidelines with the pt and my recommendation to pursue concurrent chemoradiation, not surgery, in the setting of the patient's stable metastatic lung cancer. The pt agrees with this recommendation and is also opposed to surgery herself.   Weight changes, if any: No  Bowel/Bladder complaints, if any: On a shot for diarrhea  Nausea / Vomiting, if any: No  Pain issues, if any: No   No changes to diet, no swallowing difficulties that hinder eating.  BP 140/71 (BP Location: Left Arm, Patient Position: Sitting)   Pulse 85   Temp 98.1 F (36.7 C) (Oral)   Resp 18   Ht 5' 8.5" (1.74 m)   Wt 149 lb 12.8 oz (67.9 kg)   SpO2 99%   BMI 22.45 kg/m    Wt Readings from Last 3 Encounters:  01/23/19 149 lb 12.8 oz (67.9 kg)  01/21/19 150 lb 12 oz (68.4 kg)  01/09/19 150 lb 1.6 oz (68.1 kg)   SAFETY ISSUES:  Prior radiation? Palliative to the left ilium  Pacemaker/ICD? No  Possible current pregnancy? Hysterectomy  Is the patient on  methotrexate? No  Current Complaints/Details: PEG placement 01/24/2019

## 2019-01-24 ENCOUNTER — Ambulatory Visit (HOSPITAL_COMMUNITY): Payer: Medicare Other

## 2019-01-24 ENCOUNTER — Other Ambulatory Visit (HOSPITAL_COMMUNITY): Payer: Medicare Other

## 2019-01-24 NOTE — Progress Notes (Signed)
Radiation Oncology         [phone] ________________________________  Name: Cindy Byrd        MRN: [id number]  Date of Service: 01/23/2019 DOB: [birth year]  WU:[id number], Berna Spare, NP  [name], MD     REFERRING PHYSICIAN: [name], MD   DIAGNOSIS: The primary encounter diagnosis was Adenocarcinoma of cardio-esophageal junction ([place]). A diagnosis of Malignant neoplasm of lower third of esophagus (H[facility]) was also pertinent to this visit.   HISTORY OF PRESENT ILLNESS: Cindy Byrd is a 78 y.o. female seen at the request of Dr. Irene Limbo for a newly diagnosed esophageal cancer.  The patient has a history of stage IV non-small cell carcinoma of the lung that was diagnosed in 2016 after undergoing a left iliac biopsy.  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  She has been more than 6 months away from systemic therapy, and has been clinically and radiographically stable from her lung cancer, and her most recent staging scans of the chest on 11/06/2018, she had a similar appearance of the large mixed lytic and sclerotic lesion in the left ilium without metastatic disease that was new, interval development of thickening of the distal third of the esophagus, and changes consistent with underlying COPD of the lungs.  Given the finding of the esophageal abnormality, she was sent to be evaluated by gastroenterology.  Of note she also has a history of Barrett's esophagus and a dysplastic nodule that had been identified in 2017 by GI providers at [facility].  She was sent to be seen by Dr. [name], and EGD on 01/02/2018 revealed a mass in the distal esophagus and half of the luminal surface was involved.  The lesion was not obstructing.  A biopsy of this site revealed an adenocarcinoma.  She is scheduled to undergo PEG tube placement with IR tomorrow, and imaging on Friday.  She comes  today to discuss options of treatment.     PREVIOUS RADIATION THERAPY: Yes      PAST MEDICAL HISTORY:  Past Medical History:  Diagnosis Date  . Barrett's esophagus   . Bone neoplasm 06/24/2015  . Cancer [facility])    metastatic poorly differentiated carcinoma. tumor left groin surgical removal with radiation tx.  . Cataract    BILATERAL  . Cigarette smoker two packs a day or less    Currently still smoking 2 PPD - Not interested in quitting at this time.  . Colitis 2017  . Colon polyps    hyperplastic, tubular adenomas, tubulovillous adenoma  . Cough, persistent    hx. lung cancer ? primary-being evaluated, unsure of primary site.  . Depression 06/24/2015  . Diverticulosis   . Emphysema of lung ([place])   . Endometriosis    Hysterectomy with BSO at age 34 yrs  . Esophageal adenocarcinoma ([name]) 08/11/15   intramucosal  . Gastritis   . GERD (gastroesophageal reflux disease)   . H/O: pneumonia   . Hiatal hernia   . Hyperlipidemia   . Hypertension 06/24/2015   likely improved incidental to 40 lbs weight loss from her neoplasm. No Longer taking med for this as of 08-06-15  . IBS (irritable bowel syndrome)   . Pain    left hip-persistent"tumor of bone"-radiation tx. 10.  . Vitamin D deficiency disease        PAST SURGICAL HISTORY: Past Surgical History:  Procedure Laterality Date  .  BARTHOLIN GLAND CYST EXCISION  78 yo ago   Does not want if it was an infected cyst or tumor. Was soon as delivery  . BIOPSY  01/02/2019   Procedure: BIOPSY;  Surgeon: [name], MD;  Location: [facility];  Service: Gastroenterology;;  . CATARACT EXTRACTION    . COLONOSCOPY W/ POLYPECTOMY     multiple times - last done 09/2014 per patient.  . ESOPHAGOGASTRODUODENOSCOPY (EGD) WITH PROPOFOL N/A 08/11/2015   Procedure: ESOPHAGOGASTRODUODENOSCOPY (EGD) WITH PROPOFOL;  Surgeon: [name], MD;  Location: [facility];  Service: Gastroenterology;  Laterality: N/A;  . ESOPHAGOGASTRODUODENOSCOPY (EGD)  WITH PROPOFOL N/A 01/02/2019   Procedure: ESOPHAGOGASTRODUODENOSCOPY (EGD) WITH PROPOFOL;  Surgeon: [name], MD;  Location: [facility];  Service: Gastroenterology;  Laterality: N/A;  . FLEXIBLE SIGMOIDOSCOPY N/A 06/24/2017   Procedure: FLEXIBLE SIGMOIDOSCOPY;  Surgeon: Manus Gunning, MD;  Location: [facility];  Service: Gastroenterology;  Laterality: N/A;  . GANGLION CYST EXCISION    . KNEE ARTHROSCOPY  age about 48 yrs  . TONSILLECTOMY    . TOTAL ABDOMINAL HYSTERECTOMY W/ BILATERAL SALPINGOOPHORECTOMY  at age 12 yrs   For endometriosis     FAMILY HISTORY:  Family History  Problem Relation Age of Onset  . Colon cancer Brother   . Colon cancer Brother   . Stroke Mother   . Colon cancer Father   . Breast cancer Daughter 106       ER/PR+ stage II     SOCIAL HISTORY:  reports that she quit smoking about 3 years ago. Her smoking use included cigarettes. She has a 60.00 pack-year smoking history. She has never used smokeless tobacco. She reports that she does not drink alcohol or use drugs.  The patient is widowed.  She lives in [place].   ALLERGIES: Penicillins; Remeron [mirtazapine]; and Latex   MEDICATIONS:  Current Outpatient Medications  Medication Sig Dispense Refill  . amLODipine (NORVASC) 10 MG tablet TAKE 1 TABLET BY MOUTH EVERY DAY (Patient taking differently: Take 10 mg by mouth daily. ) 90 tablet 0  . Cholecalciferol (VITAMIN D3) 25 MCG (1000 UT) CAPS Take 1,000 Units by mouth daily.    . citalopram (CELEXA) 20 MG tablet Take 20 mg by mouth daily.    [name] dexamethasone (DECADRON) 4 MG tablet Take 2 tablets (8 mg total) by mouth daily. Start the day after chemotherapy for 2 days. 30 tablet 1  . FLUoxetine (PROZAC) 20 MG capsule Take 1 capsule (20 mg total) by mouth daily. 90 capsule 3  . lidocaine-prilocaine (EMLA) cream Apply to affected area once 30 g 3  . LORazepam (ATIVAN) 0.5 MG tablet Take 1 tablet (0.5 mg total) by mouth every 6 (six) hours as  needed (Nausea or vomiting). 30 tablet 0  . omeprazole (PRILOSEC) 40 MG capsule TAKE 1 CAPSULE BY MOUTH EVERY DAY (Patient taking differently: Take 40 mg by mouth 2 (two) times daily. ) 90 capsule 2  . ondansetron (ZOFRAN) 8 MG tablet Take 1 tablet (8 mg total) by mouth 2 (two) times daily as needed for refractory nausea / vomiting. Start on day 3 after chemo. 30 tablet 1  . Probiotic Product (PROBIOTIC PO) Take 1 capsule by mouth 2 (two) times daily.    . prochlorperazine (COMPAZINE) 10 MG tablet Take 1 tablet (10 mg total) by mouth every 6 (six) hours as needed (Nausea or vomiting). 30 tablet 1  . traZODone (DESYREL) 50 MG tablet TAKE 1 TABLET BY MOUTH EVERYDAY AT BEDTIME (Patient taking  differently: Take 50 mg by mouth at bedtime. ) 90 tablet 2  . XARELTO 20 MG TABS tablet TAKE 1 TABLET DAILY WITH SUPPER. DC LOVENOX AND START XARELTO AT THE SCHEDULED TIME AS INSTRUCTED. (Patient taking differently: Take 20 mg by mouth daily with supper. ) 90 tablet 1  . amitriptyline (ELAVIL) 25 MG tablet     . Vitamin D, Ergocalciferol, (DRISDOL) 1.25 MG (50000 UT) CAPS capsule TAKE 1 CAPSULE BY MOUTH EVERY 7 DAYS     No current facility-administered medications for this encounter.      REVIEW OF SYSTEMS: On review of systems, the patient reports that she is doing well overall.  She reports she is able to eat all foods without disruption, and has not had any unintended weight loss.  She denies any chest pain, shortness of breath, cough, fevers, chills, night sweats.  She denies any bowel or bladder disturbances, and denies abdominal pain, nausea or vomiting.  She denies any new musculoskeletal or joint aches or pains. A complete review of systems is obtained and is otherwise negative.     PHYSICAL EXAM:  Wt Readings from Last 3 Encounters:  01/23/19 149 lb 12.8 oz (67.9 kg)  01/21/19 150 lb 12 oz (68.4 kg)  01/09/19 150 lb 1.6 oz (68.1 kg)   Temp Readings from Last 3 Encounters:  01/23/19 98.1 F (36.7  C) (Oral)  01/21/19 98.1 F (36.7 C) (Oral)  01/09/19 97.9 F (36.6 C) (Oral)   BP Readings from Last 3 Encounters:  01/23/19 140/71  01/21/19 (!) 148/80  01/09/19 121/69   Pulse Readings from Last 3 Encounters:  01/23/19 85  01/21/19 89  01/09/19 83   Pain Assessment Pain Score: 0-No pain/10  In general this is a well appearing Caucasian female in no acute distress.  She is alert and oriented x4 and appropriate throughout the examination. HEENT reveals that the patient is normocephalic, atraumatic. EOMs are intact. Cardiopulmonary assessment is negative for acute distress and she exhibits normal effort.     ECOG = 0  0 - Asymptomatic (Fully active, able to carry on all predisease activities without restriction)  1 - Symptomatic but completely ambulatory (Restricted in physically strenuous activity but ambulatory and able to carry out work of a light or sedentary nature. For example, light housework, office work)  2 - Symptomatic, <50% in bed during the day (Ambulatory and capable of all self care but unable to carry out any work activities. Up and about more than 50% of waking hours)  3 - Symptomatic, >50% in bed, but not bedbound (Capable of only limited self-care, confined to bed or chair 50% or more of waking hours)  4 - Bedbound (Completely disabled. Cannot carry on any self-care. Totally confined to bed or chair)  5 - Death   Eustace Pen MM, Creech RH, Tormey DC, et al. [phone]). "Toxicity and response criteria of the [facility]". [place] Oncol. 5 (6): 649-55    LABORATORY DATA:  Lab Results  Component Value Date   WBC 6.5 01/21/2019   HGB 10.9 (L) 01/21/2019   HCT 35.5 (L) 01/21/2019   MCV 85.7 01/21/2019   PLT 215 01/21/2019   Lab Results  Component Value Date   NA 140 01/21/2019   K 3.9 01/21/2019   CL 109 01/21/2019   CO2 20 (L) 01/21/2019   Lab Results  Component Value Date   ALT 10 01/21/2019   AST 19 01/21/2019   ALKPHOS  69 01/21/2019  BILITOT 0.3 01/21/2019      RADIOGRAPHY: No results found.     IMPRESSION/PLAN: 1. Adenocarcinoma of the distal esophagus.  Dr. Lisbeth Renshaw discusses the nature of esophageal cancer, and discusses that he would recommend completion of her work-up with pet imaging.  She is scheduled for this on Friday.  We will follow-up with these results when available.  If she has early stage disease, we will discuss her case with Dr. Rod Can and with Dr. Servando Snare.  Despite her stage IV diagnosis of lung cancer, she appears to be doing extremely well clinically.  We did review standard of treatment for locally advanced disease including chemoradiation, and discussed the risks, benefits, short and long-term effects of therapy, delivery and logistics and Dr. Lisbeth Renshaw would recommend a course of 5-1/2 weeks of treatment if we are to proceed in this manner.  At the end of the conversation the patient is pleased to move forward as appropriate. 2.  PEG tube placement.  The patient is interested in foregoing this if possible.  We will reach out to Dr. Claiborne Billings.  We will cancel her procedure for tomorrow she would like to at least know her staging prior to considering this procedure.  In a visit lasting 60 minutes, greater than 50% of the time was spent face to face discussing her case, and coordinating the patient's care.   The above documentation reflects my direct findings during this shared patient visit. Please see the separate note by Dr. Lisbeth Renshaw on this date for the remainder of the patient's plan of care.    Carola Rhine, PAC

## 2019-01-25 ENCOUNTER — Encounter (HOSPITAL_COMMUNITY)
Admission: RE | Admit: 2019-01-25 | Discharge: 2019-01-25 | Disposition: A | Discharge status: Home / Self Care | Payer: Medicare Other | Source: Ambulatory Visit | Attending: Hematology | Admitting: Hematology

## 2019-01-25 DIAGNOSIS — J439 Emphysema, unspecified: Secondary | ICD-10-CM | POA: Diagnosis not present

## 2019-01-25 DIAGNOSIS — I700 Atherosclerosis of aorta: Secondary | ICD-10-CM | POA: Insufficient documentation

## 2019-01-25 DIAGNOSIS — C159 Malignant neoplasm of esophagus, unspecified: Secondary | ICD-10-CM | POA: Diagnosis not present

## 2019-01-25 DIAGNOSIS — C7951 Secondary malignant neoplasm of bone: Secondary | ICD-10-CM | POA: Diagnosis not present

## 2019-01-25 DIAGNOSIS — C160 Malignant neoplasm of cardia: Secondary | ICD-10-CM | POA: Insufficient documentation

## 2019-01-25 DIAGNOSIS — I2510 Atherosclerotic heart disease of native coronary artery without angina pectoris: Secondary | ICD-10-CM | POA: Diagnosis not present

## 2019-01-25 LAB — GLUCOSE, CAPILLARY: Glucose-Capillary: 104 mg/dL — ABNORMAL HIGH (ref 70–99)

## 2019-01-25 MED ORDER — FLUDEOXYGLUCOSE F - 18 (FDG) INJECTION
7.3800 | Freq: Once | INTRAVENOUS | Status: AC | PRN
  Administered 2019-01-25: 7.38 via INTRAVENOUS

## 2019-01-28 ENCOUNTER — Telehealth: Payer: Self-pay | Admitting: Radiation Oncology

## 2019-01-28 NOTE — Telephone Encounter (Signed)
I called and spoke with the patient and let her know her PET scan results. I spoke with Dr. Servando Snare, and her age and prior history of Stage IV lung disease do not make her a candidate for esophagectomy. She will proceed with chemoRT and return on Wednesday to simulate. We will follow up as well with Dr. Grier Mitts recommendations so he can weigh in prior to her starting treatment. I did let her know as well about the incidental findings from her PET including changes in her left iliac bone that's been known since her original lung cancer diagnosis. She receives montly sandostatin injections as well and her most recent was in her right buttock corresponding to her PET images in the right gluteal region.

## 2019-01-30 ENCOUNTER — Ambulatory Visit
Admission: RE | Admit: 2019-01-30 | Discharge: 2019-01-30 | Disposition: A | Discharge status: Home / Self Care | Payer: Medicare Other | Source: Ambulatory Visit | Attending: Radiation Oncology | Admitting: Radiation Oncology

## 2019-01-30 DIAGNOSIS — C155 Malignant neoplasm of lower third of esophagus: Secondary | ICD-10-CM

## 2019-01-30 DIAGNOSIS — Z510 Encounter for antineoplastic radiation therapy: Secondary | ICD-10-CM | POA: Diagnosis not present

## 2019-01-31 ENCOUNTER — Encounter: Payer: Self-pay | Admitting: General Practice

## 2019-01-31 NOTE — Progress Notes (Signed)
[facility] Psychosocial Distress Screening Clinical Social Work  Clinical Social Work was referred by distress screening protocol.  The patient scored a 5 on the Psychosocial Distress Thermometer which indicates moderate distress. Clinical Social Worker contacted patient by phone to assess for distress and other psychosocial needs. Patient's major concerns are financial - offered information on [place] and the two gas card programs she is eligible for.  Offered to meet w her at next [facility] visit, patient declined stating she often does not want to stay at Center any longer than necessary.  Will refer to [place] for [employer], mail information on [employer] and gas card programs.    ONCBCN DISTRESS SCREENING 01/23/2019  Screening Type Initial Screening  Distress experienced in past week (1-10) 5  Emotional problem type Nervousness/Anxiety;Adjusting to illness;Adjusting to appearance changes  Spiritual/Religous concerns type   Physical Problem type Pain;Sleep/insomnia;Breathing;Skin dry/itchy  Physician notified of physical symptoms   Referral to clinical psychology   Referral to clinical social work   Referral to dietition   Referral to financial advocate   Referral to support programs   Referral to palliative care     Clinical Social Worker follow up needed: No.  If yes, follow up plan:  Beverely Pace, [place], LCSW Clinical Social Worker Phone:  [phone]

## 2019-02-01 DIAGNOSIS — Z510 Encounter for antineoplastic radiation therapy: Secondary | ICD-10-CM | POA: Diagnosis not present

## 2019-02-01 DIAGNOSIS — C155 Malignant neoplasm of lower third of esophagus: Secondary | ICD-10-CM | POA: Diagnosis not present

## 2019-02-04 ENCOUNTER — Ambulatory Visit
Admission: RE | Admit: 2019-02-04 | Discharge: 2019-02-04 | Disposition: A | Discharge status: Home / Self Care | Payer: Medicare Other | Source: Ambulatory Visit | Attending: Radiation Oncology | Admitting: Radiation Oncology

## 2019-02-04 DIAGNOSIS — Z510 Encounter for antineoplastic radiation therapy: Secondary | ICD-10-CM | POA: Diagnosis not present

## 2019-02-04 DIAGNOSIS — C155 Malignant neoplasm of lower third of esophagus: Secondary | ICD-10-CM | POA: Insufficient documentation

## 2019-02-05 ENCOUNTER — Ambulatory Visit
Admission: RE | Admit: 2019-02-05 | Discharge: 2019-02-05 | Disposition: A | Discharge status: Home / Self Care | Payer: Medicare Other | Source: Ambulatory Visit | Attending: Radiation Oncology | Admitting: Radiation Oncology

## 2019-02-05 DIAGNOSIS — Z510 Encounter for antineoplastic radiation therapy: Secondary | ICD-10-CM | POA: Diagnosis not present

## 2019-02-05 DIAGNOSIS — C155 Malignant neoplasm of lower third of esophagus: Secondary | ICD-10-CM | POA: Diagnosis not present

## 2019-02-06 ENCOUNTER — Inpatient Hospital Stay: Payer: Medicare Other

## 2019-02-06 ENCOUNTER — Inpatient Hospital Stay: Payer: Medicare Other | Attending: Hematology

## 2019-02-06 ENCOUNTER — Ambulatory Visit
Admission: RE | Admit: 2019-02-06 | Discharge: 2019-02-06 | Disposition: A | Discharge status: Home / Self Care | Payer: Medicare Other | Source: Ambulatory Visit | Attending: Radiation Oncology | Admitting: Radiation Oncology

## 2019-02-06 VITALS — BP 126/68 | HR 81 | Resp 18

## 2019-02-06 DIAGNOSIS — C155 Malignant neoplasm of lower third of esophagus: Secondary | ICD-10-CM | POA: Diagnosis not present

## 2019-02-06 DIAGNOSIS — C775 Secondary and unspecified malignant neoplasm of intrapelvic lymph nodes: Secondary | ICD-10-CM | POA: Insufficient documentation

## 2019-02-06 DIAGNOSIS — K521 Toxic gastroenteritis and colitis: Secondary | ICD-10-CM

## 2019-02-06 DIAGNOSIS — C3490 Malignant neoplasm of unspecified part of unspecified bronchus or lung: Secondary | ICD-10-CM

## 2019-02-06 DIAGNOSIS — R197 Diarrhea, unspecified: Secondary | ICD-10-CM

## 2019-02-06 DIAGNOSIS — Z5111 Encounter for antineoplastic chemotherapy: Secondary | ICD-10-CM | POA: Insufficient documentation

## 2019-02-06 DIAGNOSIS — C3491 Malignant neoplasm of unspecified part of right bronchus or lung: Secondary | ICD-10-CM | POA: Insufficient documentation

## 2019-02-06 DIAGNOSIS — C7951 Secondary malignant neoplasm of bone: Secondary | ICD-10-CM | POA: Diagnosis not present

## 2019-02-06 DIAGNOSIS — C160 Malignant neoplasm of cardia: Secondary | ICD-10-CM

## 2019-02-06 DIAGNOSIS — Z7189 Other specified counseling: Secondary | ICD-10-CM

## 2019-02-06 DIAGNOSIS — K529 Noninfective gastroenteritis and colitis, unspecified: Secondary | ICD-10-CM

## 2019-02-06 DIAGNOSIS — Z510 Encounter for antineoplastic radiation therapy: Secondary | ICD-10-CM | POA: Diagnosis not present

## 2019-02-06 DIAGNOSIS — Z95828 Presence of other vascular implants and grafts: Secondary | ICD-10-CM

## 2019-02-06 LAB — COMPREHENSIVE METABOLIC PANEL
ALT: 14 U/L (ref 0–44)
AST: 21 U/L (ref 15–41)
Albumin: 3.8 g/dL (ref 3.5–5.0)
Alkaline Phosphatase: 54 U/L (ref 38–126)
Anion gap: 6 (ref 5–15)
BUN: 17 mg/dL (ref 8–23)
CO2: 26 mmol/L (ref 22–32)
Calcium: 8.7 mg/dL — ABNORMAL LOW (ref 8.9–10.3)
Chloride: 106 mmol/L (ref 98–111)
Creatinine, Ser: 1.22 mg/dL — ABNORMAL HIGH (ref 0.44–1.00)
GFR calc non Af Amer: 42 mL/min — ABNORMAL LOW (ref >60)
GFR, EST AFRICAN AMERICAN: 49 mL/min — AB (ref >60)
Glucose, Bld: 122 mg/dL — ABNORMAL HIGH (ref 70–99)
Potassium: 4.4 mmol/L (ref 3.5–5.1)
Sodium: 138 mmol/L (ref 135–145)
Total Bilirubin: 0.2 mg/dL — ABNORMAL LOW (ref 0.3–1.2)
Total Protein: 7.4 g/dL (ref 6.5–8.1)

## 2019-02-06 LAB — CBC WITH DIFFERENTIAL/PLATELET
Abs Immature Granulocytes: 0 10*3/uL (ref 0.00–0.07)
Basophils Absolute: 0.1 10*3/uL (ref 0.0–0.1)
Basophils Relative: 1 %
Eosinophils Absolute: 0.1 10*3/uL (ref 0.0–0.5)
Eosinophils Relative: 2 %
HCT: 34.7 % — ABNORMAL LOW (ref 36.0–46.0)
Hemoglobin: 10.7 g/dL — ABNORMAL LOW (ref 12.0–15.0)
Immature Granulocytes: 0 %
Lymphocytes Relative: 20 %
Lymphs Abs: 1 10*3/uL (ref 0.7–4.0)
MCH: 26.6 pg (ref 26.0–34.0)
MCHC: 30.8 g/dL (ref 30.0–36.0)
MCV: 86.3 fL (ref 80.0–100.0)
MONO ABS: 0.5 10*3/uL (ref 0.1–1.0)
Monocytes Relative: 10 %
Neutro Abs: 3.5 10*3/uL (ref 1.7–7.7)
Neutrophils Relative %: 67 %
Platelets: 194 10*3/uL (ref 150–400)
RBC: 4.02 MIL/uL (ref 3.87–5.11)
RDW: 14.6 % (ref 11.5–15.5)
WBC: 5.2 10*3/uL (ref 4.0–10.5)
nRBC: 0 % (ref 0.0–0.2)

## 2019-02-06 MED ORDER — OCTREOTIDE ACETATE 30 MG IM KIT
30.0000 mg | PACK | Freq: Once | INTRAMUSCULAR | Status: AC
  Administered 2019-02-06: 30 mg via INTRAMUSCULAR

## 2019-02-06 MED ORDER — OCTREOTIDE ACETATE 30 MG IM KIT
PACK | INTRAMUSCULAR | Status: AC
  Filled 2019-02-06: qty 1

## 2019-02-06 MED ORDER — DENOSUMAB 120 MG/1.7ML ~~LOC~~ SOLN: 120.0000 mg | Freq: Once | SUBCUTANEOUS | Status: DC

## 2019-02-06 NOTE — Patient Instructions (Signed)

## 2019-02-07 ENCOUNTER — Ambulatory Visit
Admission: RE | Admit: 2019-02-07 | Discharge: 2019-02-07 | Disposition: A | Discharge status: Home / Self Care | Payer: Medicare Other | Source: Ambulatory Visit | Attending: Radiation Oncology | Admitting: Radiation Oncology

## 2019-02-07 DIAGNOSIS — Z510 Encounter for antineoplastic radiation therapy: Secondary | ICD-10-CM | POA: Diagnosis not present

## 2019-02-07 DIAGNOSIS — C155 Malignant neoplasm of lower third of esophagus: Secondary | ICD-10-CM | POA: Diagnosis not present

## 2019-02-08 ENCOUNTER — Ambulatory Visit
Admission: RE | Admit: 2019-02-08 | Discharge: 2019-02-08 | Disposition: A | Discharge status: Home / Self Care | Payer: Medicare Other | Source: Ambulatory Visit | Attending: Radiation Oncology | Admitting: Radiation Oncology

## 2019-02-08 DIAGNOSIS — Z510 Encounter for antineoplastic radiation therapy: Secondary | ICD-10-CM | POA: Diagnosis not present

## 2019-02-08 DIAGNOSIS — C155 Malignant neoplasm of lower third of esophagus: Secondary | ICD-10-CM | POA: Diagnosis not present

## 2019-02-08 DIAGNOSIS — C160 Malignant neoplasm of cardia: Secondary | ICD-10-CM

## 2019-02-08 MED ORDER — SONAFINE EX EMUL
1.0000 "application " | Freq: Once | CUTANEOUS | Status: AC
  Administered 2019-02-08: 1 via TOPICAL

## 2019-02-11 ENCOUNTER — Ambulatory Visit
Admission: RE | Admit: 2019-02-11 | Discharge: 2019-02-11 | Disposition: A | Discharge status: Home / Self Care | Payer: Medicare Other | Source: Ambulatory Visit | Attending: Radiation Oncology | Admitting: Radiation Oncology

## 2019-02-11 ENCOUNTER — Telehealth: Payer: Self-pay | Admitting: Hematology

## 2019-02-11 ENCOUNTER — Telehealth: Payer: Self-pay | Admitting: *Deleted

## 2019-02-11 DIAGNOSIS — Z510 Encounter for antineoplastic radiation therapy: Secondary | ICD-10-CM | POA: Diagnosis not present

## 2019-02-11 DIAGNOSIS — C155 Malignant neoplasm of lower third of esophagus: Secondary | ICD-10-CM | POA: Diagnosis not present

## 2019-02-11 NOTE — Telephone Encounter (Signed)
Scheduled appt per 3/09 sch message - unable to reach pt - left message with appt date and time

## 2019-02-11 NOTE — Progress Notes (Addendum)
  Radiation Oncology         (336) [phone] ________________________________  Name: Cindy Byrd MRN: [id number]  Date: 01/30/2019  DOB: 1941-01-21  SIMULATION AND TREATMENT PLANNING NOTE  DIAGNOSIS:     ICD-10-CM   1. Malignant neoplasm of distal third of esophagus ([place]) C15.5      Site:  esophagus  NARRATIVE:  The patient was brought to the [facility].  Identity was confirmed.  All relevant records and images related to the planned course of therapy were reviewed.   Written consent to proceed with treatment was confirmed which was freely given after reviewing the details related to the planned course of therapy had been reviewed with the patient.  Then, the patient was set-up in a stable reproducible supine position for radiation therapy.  CT images were obtained.  Surface markings were placed.    Medically necessary complex treatment device(s) for immobilization:  vac-lock bag.   The CT images were loaded into the planning software.  Then the target and avoidance structures were contoured.  Treatment planning then occurred.  The radiation prescription was entered and confirmed. I have requested : Intensity Modulated Radiotherapy (IMRT) is medically necessary for this case for the following reason:  sparing of nearby critical normal structures including the spinal cord, heart, and lungs.   The patient will undergo daily image guidance to ensure accurate localization of the target, and adequate minimize dose to the normal surrounding structures in close proximity to the target.   PLAN:  The patient will receive 45 Gy in 25 fractions initially. A boost will then be given to 5.4 Gy to yield 50.4 Gy to the high dose target, utilizing a sequential boost technique.   Special treatment procedure The patient will also receive concurrent chemotherapy during the treatment. The patient may therefore experience increased toxicity or side effects and the patient will be monitored  for such problems. This may require extra lab work as necessary. This therefore constitutes a special treatment procedure.   ________________________________   Jodelle Gross, MD, PhD

## 2019-02-11 NOTE — Telephone Encounter (Signed)
Patient called - left voice mail- asked when additional infusion appts will be scheduled. Message sent to scheduling to f/u with appts and contact patient. Patient notified that scheduling will contact her and she verbalized understanding.

## 2019-02-12 ENCOUNTER — Inpatient Hospital Stay: Payer: Medicare Other

## 2019-02-12 ENCOUNTER — Ambulatory Visit
Admission: RE | Admit: 2019-02-12 | Discharge: 2019-02-12 | Disposition: A | Discharge status: Home / Self Care | Payer: Medicare Other | Source: Ambulatory Visit | Attending: Radiation Oncology | Admitting: Radiation Oncology

## 2019-02-12 ENCOUNTER — Inpatient Hospital Stay (HOSPITAL_BASED_OUTPATIENT_CLINIC_OR_DEPARTMENT_OTHER): Payer: Medicare Other | Admitting: Hematology

## 2019-02-12 VITALS — BP 127/60 | HR 80 | Temp 97.9°F | Resp 16 | Wt 154.0 lb

## 2019-02-12 DIAGNOSIS — C3491 Malignant neoplasm of unspecified part of right bronchus or lung: Secondary | ICD-10-CM

## 2019-02-12 DIAGNOSIS — C155 Malignant neoplasm of lower third of esophagus: Secondary | ICD-10-CM

## 2019-02-12 DIAGNOSIS — C775 Secondary and unspecified malignant neoplasm of intrapelvic lymph nodes: Secondary | ICD-10-CM

## 2019-02-12 DIAGNOSIS — C7951 Secondary malignant neoplasm of bone: Secondary | ICD-10-CM

## 2019-02-12 DIAGNOSIS — Z87891 Personal history of nicotine dependence: Secondary | ICD-10-CM

## 2019-02-12 DIAGNOSIS — I82401 Acute embolism and thrombosis of unspecified deep veins of right lower extremity: Secondary | ICD-10-CM | POA: Diagnosis not present

## 2019-02-12 DIAGNOSIS — C3490 Malignant neoplasm of unspecified part of unspecified bronchus or lung: Secondary | ICD-10-CM

## 2019-02-12 DIAGNOSIS — R197 Diarrhea, unspecified: Secondary | ICD-10-CM

## 2019-02-12 DIAGNOSIS — Z7189 Other specified counseling: Secondary | ICD-10-CM

## 2019-02-12 DIAGNOSIS — I2699 Other pulmonary embolism without acute cor pulmonale: Secondary | ICD-10-CM

## 2019-02-12 DIAGNOSIS — Z510 Encounter for antineoplastic radiation therapy: Secondary | ICD-10-CM | POA: Diagnosis not present

## 2019-02-12 DIAGNOSIS — Z5111 Encounter for antineoplastic chemotherapy: Secondary | ICD-10-CM | POA: Diagnosis not present

## 2019-02-12 LAB — CMP (CANCER CENTER ONLY)
ALT: 10 U/L (ref 0–44)
ANION GAP: 9 (ref 5–15)
AST: 18 U/L (ref 15–41)
Albumin: 3.2 g/dL — ABNORMAL LOW (ref 3.5–5.0)
Alkaline Phosphatase: 65 U/L (ref 38–126)
BUN: 15 mg/dL (ref 8–23)
CO2: 24 mmol/L (ref 22–32)
Calcium: 8.8 mg/dL — ABNORMAL LOW (ref 8.9–10.3)
Chloride: 109 mmol/L (ref 98–111)
Creatinine: 1.14 mg/dL — ABNORMAL HIGH (ref 0.44–1.00)
GFR, Est AFR Am: 53 mL/min — ABNORMAL LOW (ref >60)
GFR, Estimated: 46 mL/min — ABNORMAL LOW (ref >60)
GLUCOSE: 93 mg/dL (ref 70–99)
POTASSIUM: 4.3 mmol/L (ref 3.5–5.1)
Sodium: 142 mmol/L (ref 135–145)
Total Bilirubin: 0.3 mg/dL (ref 0.3–1.2)
Total Protein: 7.3 g/dL (ref 6.5–8.1)

## 2019-02-12 LAB — CBC WITH DIFFERENTIAL/PLATELET
Abs Immature Granulocytes: 0.06 10*3/uL (ref 0.00–0.07)
Basophils Absolute: 0.1 10*3/uL (ref 0.0–0.1)
Basophils Relative: 1 %
Eosinophils Absolute: 0.1 10*3/uL (ref 0.0–0.5)
Eosinophils Relative: 3 %
HCT: 34.8 % — ABNORMAL LOW (ref 36.0–46.0)
Hemoglobin: 10.6 g/dL — ABNORMAL LOW (ref 12.0–15.0)
Immature Granulocytes: 1 %
Lymphocytes Relative: 15 %
Lymphs Abs: 0.7 10*3/uL (ref 0.7–4.0)
MCH: 26.5 pg (ref 26.0–34.0)
MCHC: 30.5 g/dL (ref 30.0–36.0)
MCV: 87 fL (ref 80.0–100.0)
Monocytes Absolute: 0.6 10*3/uL (ref 0.1–1.0)
Monocytes Relative: 12 %
NEUTROS PCT: 68 %
Neutro Abs: 3.3 10*3/uL (ref 1.7–7.7)
Platelets: 228 10*3/uL (ref 150–400)
RBC: 4 MIL/uL (ref 3.87–5.11)
RDW: 15 % (ref 11.5–15.5)
WBC: 4.9 10*3/uL (ref 4.0–10.5)
nRBC: 0 % (ref 0.0–0.2)

## 2019-02-12 MED ORDER — SODIUM CHLORIDE 0.9 % IV SOLN
Freq: Once | INTRAVENOUS | Status: AC
  Administered 2019-02-12: 14:00:00 via INTRAVENOUS
  Filled 2019-02-12: qty 250

## 2019-02-12 MED ORDER — FAMOTIDINE IN NACL 20-0.9 MG/50ML-% IV SOLN: 20.0000 mg | Freq: Once | INTRAVENOUS | Status: DC

## 2019-02-12 MED ORDER — SODIUM CHLORIDE 0.9 % IV SOLN
20.0000 mg | Freq: Once | INTRAVENOUS | Status: AC
  Administered 2019-02-12: 20 mg via INTRAVENOUS
  Filled 2019-02-12: qty 2

## 2019-02-12 MED ORDER — DIPHENHYDRAMINE HCL 50 MG/ML IJ SOLN
INTRAMUSCULAR | Status: AC
  Filled 2019-02-12: qty 1

## 2019-02-12 MED ORDER — PALONOSETRON HCL INJECTION 0.25 MG/5ML
0.2500 mg | Freq: Once | INTRAVENOUS | Status: AC
  Administered 2019-02-12: 0.25 mg via INTRAVENOUS

## 2019-02-12 MED ORDER — PALONOSETRON HCL INJECTION 0.25 MG/5ML
INTRAVENOUS | Status: AC
  Filled 2019-02-12: qty 5

## 2019-02-12 MED ORDER — SODIUM CHLORIDE 0.9 % IV SOLN
136.0000 mg | Freq: Once | INTRAVENOUS | Status: AC
  Administered 2019-02-12: 140 mg via INTRAVENOUS
  Filled 2019-02-12: qty 14

## 2019-02-12 MED ORDER — HEPARIN SOD (PORK) LOCK FLUSH 100 UNIT/ML IV SOLN
500.0000 [IU] | Freq: Once | INTRAVENOUS | Status: AC | PRN
  Administered 2019-02-12: 500 [IU]
  Filled 2019-02-12: qty 5

## 2019-02-12 MED ORDER — DIPHENHYDRAMINE HCL 50 MG/ML IJ SOLN
50.0000 mg | Freq: Once | INTRAMUSCULAR | Status: AC
  Administered 2019-02-12: 50 mg via INTRAVENOUS

## 2019-02-12 MED ORDER — SODIUM CHLORIDE 0.9% FLUSH
10.0000 mL | INTRAVENOUS | Status: DC | PRN
  Administered 2019-02-12: 10 mL
  Filled 2019-02-12: qty 10

## 2019-02-12 MED ORDER — SODIUM CHLORIDE 0.9 % IV SOLN
50.0000 mg/m2 | Freq: Once | INTRAVENOUS | Status: AC
  Administered 2019-02-12: 90 mg via INTRAVENOUS
  Filled 2019-02-12: qty 15

## 2019-02-12 NOTE — Progress Notes (Signed)
[name]    HEMATOLOGY ONCOLOGY PROGRESS NOTE  Date of service:  02/12/19    Patient Care Team: [name], NP as PCP - General (Family Medicine) [name], MD as Consulting Physician (Hematology and Oncology)  CHIEF COMPLAINTS/PURPOSE OF CONSULTATION:  F/u for metastatic lung cancer  DIAGNOSIS:   #1 Metastatic non-small cell lung cancer with bilateral lung nodules and large metastatic lesion in the left Ilium. #2 Barrett's esophagus with some evidence of intramucosal adenocarcinoma of the esophagus. 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 Also had c diff colitis - treated   Current Treatment  1) Active surveillance 2) Xgeva 169m Plum q4weeks for bone metastases. 3) Sandostatin q4weeks for diarrhea - immune colitis  Previous Treatment  1 Palliative radiation therapy to the large left ilium metastases 2. IV Nivolumab x 20ycles (discontinued due to likely immune colitis) 3. Xgeva 1210mSC q4weeks for bone metastases.   HISTORY OF PRESENTING ILLNESS: (plz see my previous consultation for details of initial presentation)  INTERVAL HISTORY:   Cindy ToTukess here for her scheduled follow-up for metastatic lung cancer, and newly diagnosed adenocarcinoma of the esophagus. The patient's last visit with [place] on 01/21/19.  The pt reports that she has been doing well in the interim. She began RT on 02/04/19 and has received 6 daily treatments of RT, and is planning to receive 6 weeks of RT. The pt notes that she is swallowing well, and denies the sensation of food getting stuck. She notes that she feels a little tired, but has been tolerating concurrent chemo-RT well. She is eating well and moving her bowels well and denies any fevers, chills, and night sweats. She notes that she continues to have some diarrhea, and has continued to find Sandostatin very helpful for this. The pt denies nausea as  well.  Of note since the patient's last visit, pt has had a PET/CT completed on 01/25/19 with results revealing Distal esophageal primary, without hypermetabolic metastatic disease. 2. Chronic left iliac metastasis, as before. 3. Hypermetabolism within and superficial to the right gluteal musculature is most likely related to trauma and/or injection sites. 4. Aortic atherosclerosis, coronary artery atherosclerosis and emphysema.  Lab results today (02/13/19) of CBC w/diff and CMP is as follows: all values are WNL except for HGB at 10.6, HCT at 34.8, Creatinine at 1.14, Calcium at 8.8, Albumin at 3.2, GFR at 46.  On review of systems, pt reports moving her bowels well, swallowing well, eating well, stable energy levels, and denies fevers, chills, night sweats, dental issues, blood in the stools, black stools, nausea, abdominal pains, leg swelling, and any other symptoms.   MEDICAL HISTORY:  Past Medical History:  Diagnosis Date  . Barrett's esophagus   . Bone neoplasm 06/24/2015  . [facility]   metastatic poorly differentiated carcinoma. tumor left groin surgical removal with radiation tx.  . Cataract    BILATERAL  . Cigarette smoker two packs a day or less    Currently still smoking 2 PPD - Not interested in quitting at this time.  . Colitis 2017  . Colon polyps    hyperplastic, tubular adenomas, tubulovillous adenoma  . Cough, persistent    hx. lung cancer ? primary-being evaluated, unsure of primary site.  . Depression 06/24/2015  . Diverticulosis   . [place]  . Endometriosis    Hysterectomy with BSO at age [age]rs  . Esophageal  adenocarcinoma ([place]) 08/11/15   intramucosal  . Gastritis   . GERD (gastroesophageal reflux disease)   . H/O: pneumonia   . Hiatal hernia   . Hyperlipidemia   . Hypertension 06/24/2015   likely improved incidental to 40 lbs weight loss from her neoplasm. No Longer taking med for this as of 08-06-15  . IBS (irritable bowel syndrome)   . Pain     left hip-persistent"tumor of bone"-radiation tx. 10.  . Vitamin D deficiency disease    SURGICAL HISTORY: Past Surgical History:  Procedure Laterality Date  . BARTHOLIN GLAND CYST EXCISION  78 yo ago   Does not want if it was an infected cyst or tumor. Was soon as delivery  . BIOPSY  01/02/2019   Procedure: BIOPSY;  Surgeon: [name], MD;  Location: [facility];  Service: Gastroenterology;;  . CATARACT EXTRACTION    . COLONOSCOPY W/ POLYPECTOMY     multiple times - last done 09/2014 per patient.  . ESOPHAGOGASTRODUODENOSCOPY (EGD) WITH PROPOFOL N/A 08/11/2015   Procedure: ESOPHAGOGASTRODUODENOSCOPY (EGD) WITH PROPOFOL;  Surgeon: [name], MD;  Location: [facility];  Service: Gastroenterology;  Laterality: N/A;  . ESOPHAGOGASTRODUODENOSCOPY (EGD) WITH PROPOFOL N/A 01/02/2019   Procedure: ESOPHAGOGASTRODUODENOSCOPY (EGD) WITH PROPOFOL;  Surgeon: [name], MD;  Location: [facility];  Service: Gastroenterology;  Laterality: N/A;  . FLEXIBLE SIGMOIDOSCOPY N/A 06/24/2017   Procedure: FLEXIBLE SIGMOIDOSCOPY;  Surgeon: Manus Gunning, MD;  Location: [facility];  Service: Gastroenterology;  Laterality: N/A;  . GANGLION CYST EXCISION    . KNEE ARTHROSCOPY  age about 64 yrs  . TONSILLECTOMY    . TOTAL ABDOMINAL HYSTERECTOMY W/ BILATERAL SALPINGOOPHORECTOMY  at age 64 yrs   For endometriosis    SOCIAL HISTORY: Social History   Socioeconomic History  . Marital status: Widowed    Spouse name: Not on file  . Number of children: 2  . Years of education: Not on file  . Highest education level: Not on file  Occupational History  . Not on file  Social Needs  . Financial resource strain: Not on file  . Food insecurity:    Worry: Not on file    Inability: Not on file  . Transportation needs:    Medical: No    Non-medical: No  Tobacco Use  . Smoking status: Former Smoker    Packs/day: 1.00    Years: 60.00    Pack years: 60.00    Types: Cigarettes    Last  attempt to quit: 12/06/2015    Years since quitting: 3.1  . Smokeless tobacco: Never Used  Substance and Sexual Activity  . Alcohol use: No    Alcohol/week: 0.0 standard drinks  . Drug use: No  . Sexual activity: Never  Lifestyle  . Physical activity:    Days per week: Not on file    Minutes per session: Not on file  . Stress: Not on file  Relationships  . Social connections:    Talks on phone: Not on file    Gets together: Not on file    Attends religious service: Not on file    Active member of club or organization: Not on file    Attends meetings of clubs or organizations: Not on file    Relationship status: Not on file  . Intimate partner violence:    Fear of current or ex partner: Not on file    Emotionally abused: Not on file    Physically abused: Not on file  Forced sexual activity: Not on file  Other Topics Concern  . Not on file  Social History Narrative  . Not on file    FAMILY HISTORY: Family History  Problem Relation Age of Onset  . Colon cancer Brother   . Colon cancer Brother   . Stroke Mother   . Colon cancer Father   . Breast cancer Daughter 2       ER/PR+ stage II    ALLERGIES:  is allergic to penicillins; remeron [mirtazapine]; and latex. patient wonders if she has a penicillin allergy but notes that she is uncertain about this.  MEDICATIONS:  Current Outpatient Medications  Medication Sig Dispense Refill  . amitriptyline (ELAVIL) 25 MG tablet     . amLODipine (NORVASC) 10 MG tablet TAKE 1 TABLET BY MOUTH EVERY DAY (Patient taking differently: Take 10 mg by mouth daily. ) 90 tablet 0  . Cholecalciferol (VITAMIN D3) 25 MCG (1000 UT) CAPS Take 1,000 Units by mouth daily.    . citalopram (CELEXA) 20 MG tablet Take 20 mg by mouth daily.    [name] dexamethasone (DECADRON) 4 MG tablet Take 2 tablets (8 mg total) by mouth daily. Start the day after chemotherapy for 2 days. 30 tablet 1  . FLUoxetine (PROZAC) 20 MG capsule Take 1 capsule (20 mg total) by  mouth daily. 90 capsule 3  . lidocaine-prilocaine (EMLA) cream Apply to affected area once 30 g 3  . LORazepam (ATIVAN) 0.5 MG tablet Take 1 tablet (0.5 mg total) by mouth every 6 (six) hours as needed (Nausea or vomiting). 30 tablet 0  . omeprazole (PRILOSEC) 40 MG capsule TAKE 1 CAPSULE BY MOUTH EVERY DAY (Patient taking differently: Take 40 mg by mouth 2 (two) times daily. ) 90 capsule 2  . ondansetron (ZOFRAN) 8 MG tablet Take 1 tablet (8 mg total) by mouth 2 (two) times daily as needed for refractory nausea / vomiting. Start on day 3 after chemo. 30 tablet 1  . Probiotic Product (PROBIOTIC PO) Take 1 capsule by mouth 2 (two) times daily.    . prochlorperazine (COMPAZINE) 10 MG tablet Take 1 tablet (10 mg total) by mouth every 6 (six) hours as needed (Nausea or vomiting). 30 tablet 1  . traZODone (DESYREL) 50 MG tablet TAKE 1 TABLET BY MOUTH EVERYDAY AT BEDTIME (Patient taking differently: Take 50 mg by mouth at bedtime. ) 90 tablet 2  . Vitamin D, Ergocalciferol, (DRISDOL) 1.25 MG (50000 UT) CAPS capsule TAKE 1 CAPSULE BY MOUTH EVERY 7 DAYS    . XARELTO 20 MG TABS tablet TAKE 1 TABLET DAILY WITH SUPPER. DC LOVENOX AND START XARELTO AT THE SCHEDULED TIME AS INSTRUCTED. (Patient taking differently: Take 20 mg by mouth daily with supper. ) 90 tablet 1   No current facility-administered medications for this visit.    Facility-Administered Medications Ordered in Other Visits  Medication Dose Route Frequency Provider Last Rate Last Dose  . CARBOplatin (PARAPLATIN) 140 mg in sodium chloride 0.9 % 250 mL chemo infusion  140 mg Intravenous Once [name], MD      . heparin lock flush 100 unit/mL  500 Units Intracatheter Once PRN [name], MD      . PACLitaxel (TAXOL) 90 mg in sodium chloride 0.9 % 250 mL chemo infusion (</= 34m/m2)  50 mg/m2 (Treatment Plan Recorded) Intravenous Once KBrunetta Genera MD 265 mL/hr at 02/12/19 1548 90 mg at 02/12/19 1548  . sodium chloride  flush (NS) 0.9 % injection 10 mL  10 mL Intracatheter PRN [name], MD        REVIEW OF SYSTEMS:    A 10+ POINT REVIEW OF SYSTEMS WAS OBTAINED including neurology, dermatology, psychiatry, cardiac, respiratory, lymph, extremities, GI, GU, Musculoskeletal, constitutional, breasts, reproductive, HEENT.  All pertinent positives are noted in the HPI.  All others are negative.   PHYSICAL EXAMINATION: ECOG PERFORMANCE STATUS: 2 - Symptomatic, <50% confined to bed  There were no vitals filed for this visit. There were no vitals filed for this visit.. . Wt Readings from Last 3 Encounters:  02/12/19 154 lb (69.9 kg)  01/23/19 149 lb 12.8 oz (67.9 kg)  01/21/19 150 lb 12 oz (68.4 kg)    GENERAL:alert, in no acute distress and comfortable SKIN: no acute rashes, no significant lesions EYES: conjunctiva are pink and non-injected, sclera anicteric OROPHARYNX: MMM, no exudates, no oropharyngeal erythema or ulceration NECK: supple, no JVD LYMPH:  no palpable lymphadenopathy in the cervical, axillary or inguinal regions LUNGS: clear to auscultation b/l with normal respiratory effort HEART: regular rate & rhythm ABDOMEN:  normoactive bowel sounds , non tender, not distended. No palpable hepatosplenomegaly.  Extremity: no pedal edema PSYCH: alert & oriented x 3 with fluent speech NEURO: no focal motor/sensory deficits   LABORATORY DATA:  I have reviewed the data as listed  . CBC Latest Ref Rng & Units 02/12/2019 02/06/2019 01/21/2019  WBC 4.0 - 10.5 K/uL 4.9 5.2 6.5  Hemoglobin 12.0 - 15.0 g/dL 10.6(L) 10.7(L) 10.9(L)  Hematocrit 36.0 - 46.0 % 34.8(L) 34.7(L) 35.5(L)  Platelets 150 - 400 K/uL 228 194 215    . CMP Latest Ref Rng & Units 02/12/2019 02/06/2019 01/21/2019  Glucose 70 - 99 mg/dL 93 122(H) 131(H)  BUN 8 - 23 mg/dL 15 17 19   Creatinine 0.44 - 1.00 mg/dL 1.14(H) 1.22(H) 1.17(H)  Sodium 135 - 145 mmol/L 142 138 140  Potassium 3.5 - 5.1 mmol/L 4.3 4.4 3.9  Chloride 98 - 111  mmol/L 109 106 109  CO2 22 - 32 mmol/L 24 26 20(L)  Calcium 8.9 - 10.3 mg/dL 8.8(L) 8.7(L) 9.3  Total Protein 6.5 - 8.1 g/dL 7.3 7.4 7.6  Total Bilirubin 0.3 - 1.2 mg/dL 0.3 0.2(L) 0.3  Alkaline Phos 38 - 126 U/L 65 54 69  AST 15 - 41 U/L 18 21 19   ALT 0 - 44 U/L 10 14 10        01/02/19 Esophagus Biopsy:    RADIOGRAPHIC STUDIES:  .Nm Pet Image Restag (ps) Skull Base To Thigh  Result Date: 01/25/2019 CLINICAL DATA:  Subsequent treatment strategy for restaging of esophageal cancer. History of metastatic lung cancer. Upper endoscopy of 01/02/2019. EXAM: NUCLEAR MEDICINE PET SKULL BASE TO THIGH TECHNIQUE: 7.4 mCi F-18 FDG was injected intravenously. Full-ring PET imaging was performed from the skull base to thigh after the radiotracer. CT data was obtained and used for attenuation correction and anatomic localization. Fasting blood glucose: 104 mg/dl COMPARISON:  11/06/2018 chest abdomen and pelvic CTs. Most recent PET of 07/01/2015. FINDINGS: Mediastinal blood pool activity: SUV max 2.7 NECK: No areas of abnormal hypermetabolism. Incidental CT findings: No cervical adenopathy. CHEST: Hypermetabolism corresponding to the previously described distal esophageal wall thickening. This measures a S.U.V. max of 17.0 including on image 92/4. Periesophageal nodes of up to 8 mm are not significantly hypermetabolic, including at a S.U.V. max of 2.1 on image 94/4. This node is similar in size to 09/14/2017 the, favoring a benign etiology. No pulmonary parenchymal hypermetabolism. Incidental CT findings: Right Port-A-Cath  tip at superior caval/atrial junction. Aortic and lad coronary artery calcification. Moderate centrilobular emphysema. Lipomatous hypertrophy of the interatrial septum. ABDOMEN/PELVIS: No abdominopelvic parenchymal or nodal hypermetabolism. Incidental CT findings: Mild renal cortical thinning bilaterally. Normal adrenal glands. Abdominal aortic atherosclerosis. Large colonic stool burden.  Hysterectomy. Pelvic floor laxity. SKELETON: The left iliac mixed lytic and sclerotic lesion demonstrates chronic pathologic fractures and relatively low-level hypermetabolism. S.U.V. max of 3.1 medially. A subcutaneous area of nodularity to superficial to the right gluteal musculature measures a S.U.V. max of 3.2 and 1.6 cm on image 147/4. There is also a focus of hypermetabolism within the right gluteus musculature medially at a S.U.V. max of 5.8. No CT correlate on image 173/4. These are both new since the prior PET. Incidental CT findings: Lower left rib remote fractures. IMPRESSION: 1. Distal esophageal primary, without hypermetabolic metastatic disease. 2. Chronic left iliac metastasis, as before. 3. Hypermetabolism within and superficial to the right gluteal musculature is most likely related to trauma and/or injection sites. 4. Aortic atherosclerosis (ICD10-I70.0), coronary artery atherosclerosis and emphysema (ICD10-J43.9). Electronically Signed   By: Abigail Miyamoto M.D.   On: 01/25/2019 14:50    ASSESSMENT & PLAN:   78 y.o. female with  #1 Metastatic poorly differentiated carcinoma with likely lung primary non-small cell lung cancer.   CT of the head with and without contrast showed no evidence of metastatic disease. EGFR blood test mutation analysis negative. CT chest abdomen pelvis 04/19/2016 shows no evidence of disease progression. Patient tolerated Nivolumab very well but was discontinued when she developed grade 2 Immune colitis. Has been off Nivolumab for >6 months  CT chest abdomen pelvis on 06/24/2016 shows no evidence of new disease or progression of metastatic disease. CT chest abdomen pelvis 09/06/2016 shows 1. Mixed interval response to therapy. 2. There is a new left ventral chest wall lesion deep to the pectoralis musculature worrisome for metastatic disease. 3. Posterior lower lobe nodular densities are identified which may reflect areas of pulmonary metastasis. 4. Interval  decrease in size of destructive lesion involving the left iliac bone.  CT chest abd pelvis 12/08/2016: Cystic mass involving the left ventral chest wall has resolved in the interval. Likely was a hematoma due to trauma. Interval increase in size of pleural base mass overlying the posterior and inferior left lower lobe. There is also a new left pleural effusion identified.  CT chest 02/01/2017: Residual irregular soft tissue thickening/volume loss and trace left pleural fluid at the base of the left hemithorax, overall improved in appearance from 12/08/2016. No measurable lesion.  CT chest 05/29/2017 shows no residual pleural based mass or significant pleural effusion in the left hemithorax. No evidence of thoracic metastatic disease. No evidence of progressive metastatic disease within the abdomen or pelvis. Mixed lytic and blastic lesion involving the left iliac bone and associated pathologic fracture are unchanged.   CT CAP 09/14/17 shows no new changes. She does have slight displacement of her fractured left iliac bone. Evidence of stable disease.   CT CAP 01/04/2018- No new or progressive metastatic disease. Stable large left iliac bone metastasis with associated chronic pathologic fracture.   CT chest/abd/pelvis done on 04/26/18 revealed Stable exam.  No new or progressive interval findings.  07/19/18 CT C/A/P revealed Stable exam.  No new or progressive interval findings. Large destructive left iliac lesion is similar to prior. Aortic Atherosclerosis and Emphysema.    11/06/18 CT C/A/P revealed Similar appearance of large mixed lytic and sclerotic lesion in the left ilium. No new  metastatic lesions are otherwise noted elsewhere in the chest, abdomen or pelvis. 2. Interval development of thickening of the distal third of the esophagus. This is nonspecific, and could be related to underlying reflux esophagitis. However, if there is any clinical concern for Barrett's metaplasia or esophageal neoplasia,  further evaluation with nonemergent endoscopy could be considered. 3. Aortic atherosclerosis, in addition to left main coronary artery disease. Assessment for potential risk factor modification, dietary therapy or pharmacologic therapy may be warranted, if clinically Indicated. 4. Diffuse bronchial wall thickening with mild to moderate centrilobular and paraseptal emphysema; imaging findings suggestive of underlying COPD. 5. Additional incidental findings, as above.   #2 Newly diagnosed Adenocarcinoma of the esophagus  Barrett's esophagus 4cms in the distal esophagus with low and high-grade dysplasia  01/02/19 Surgical pathology revealed adenocarcinoma of the esophagus   #3 diarrhea-  now resolved was previously. 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 She was previously on on Lialda, budesonide,probiotics and lomotil but not currently taking any of these. Plan -Continue Sandostatin every 4 weeks   #4 h/o diverticulitis and c dff colitis - now resolved Plan  -continue on sandostatin today and q4weeks.  -continue on Lialda  #5 DVT and PE  -continue on Xarelto - no issues with bleeding   PLAN: -Discussed pt labwork today, 02/13/19; blood counts and chemistries are stable -Discussed the 01/25/19 PET/CT which revealed Distal esophageal primary, without hypermetabolic metastatic disease. 2. Chronic left iliac metastasis, as before. 3. Hypermetabolism within and superficial to the right gluteal musculature is most likely related to trauma and/or injection sites. 4. Aortic atherosclerosis, coronary artery atherosclerosis and emphysema. -Pt began RT on 02/04/19 with Dr. Lisbeth Renshaw in [place] -The pt has no prohibitive toxicities from continuing Carboplatin and Taxol at this time- continue weekly concurrently with RT -Continue with Xgeva every 4 weeks, no dental concerns at this  time -Continue Sandostatin every 4 weeks at this time -Previously discussed the NCCN guidelines with the pt and my recommendation to pursue concurrent chemoradiation, not surgery, in the setting of the patient's stable metastatic lung cancer. The pt agrees with this recommendation and is also opposed to surgery herself. -Continue 2000 units Vitamin D every day, with dose adjustment to maintain 25OH Vit D levels of 40-60 -Trazodone for sleep difficulty -Will see the pt back in 2 weeks   -continue weekly carboplatin/taxol as order - please schedule next 5 doses with labs. -Port flush with each lab -RTC with Dr Irene Limbo in 2 weeks, 4 weeks and 6 weeks -continue q4weeks Xgeva and sandostatin - schedule next 6 doses   All questions were answered. The patient knows to call the clinic with any problems, questions or concerns.  The total time spent in the appt was 25 minutes and more than 50% was on counseling and direct patient cares.   Sullivan Lone MD [facility] Hematology/Oncology Physician [facility]  (Office): [phone] (Work cell): [phone] (Fax): [phone]  I, Baldwin [place], am acting as a scribe for Dr. Sullivan Lone.   .I have reviewed the above documentation for accuracy and completeness, and I agree with the above. [name] MD

## 2019-02-12 NOTE — Patient Instructions (Signed)

## 2019-02-13 ENCOUNTER — Other Ambulatory Visit: Payer: Self-pay

## 2019-02-13 ENCOUNTER — Telehealth: Payer: Self-pay | Admitting: Hematology

## 2019-02-13 ENCOUNTER — Ambulatory Visit
Admission: RE | Admit: 2019-02-13 | Discharge: 2019-02-13 | Disposition: A | Discharge status: Home / Self Care | Payer: Medicare Other | Source: Ambulatory Visit | Attending: Radiation Oncology | Admitting: Radiation Oncology

## 2019-02-13 DIAGNOSIS — C155 Malignant neoplasm of lower third of esophagus: Secondary | ICD-10-CM | POA: Diagnosis not present

## 2019-02-13 DIAGNOSIS — Z510 Encounter for antineoplastic radiation therapy: Secondary | ICD-10-CM | POA: Diagnosis not present

## 2019-02-13 NOTE — Telephone Encounter (Signed)
Scheduled appt per 3/10 los.  Called patient and patient stated she will stop by scheduling to get a print out of her calendar.

## 2019-02-14 ENCOUNTER — Other Ambulatory Visit: Payer: Self-pay

## 2019-02-14 ENCOUNTER — Ambulatory Visit
Admission: RE | Admit: 2019-02-14 | Discharge: 2019-02-14 | Disposition: A | Discharge status: Home / Self Care | Payer: Medicare Other | Source: Ambulatory Visit | Attending: Radiation Oncology | Admitting: Radiation Oncology

## 2019-02-14 DIAGNOSIS — C155 Malignant neoplasm of lower third of esophagus: Secondary | ICD-10-CM | POA: Diagnosis not present

## 2019-02-14 DIAGNOSIS — Z510 Encounter for antineoplastic radiation therapy: Secondary | ICD-10-CM | POA: Diagnosis not present

## 2019-02-15 ENCOUNTER — Other Ambulatory Visit: Payer: Self-pay

## 2019-02-15 ENCOUNTER — Ambulatory Visit
Admission: RE | Admit: 2019-02-15 | Discharge: 2019-02-15 | Disposition: A | Discharge status: Home / Self Care | Payer: Medicare Other | Source: Ambulatory Visit | Attending: Radiation Oncology | Admitting: Radiation Oncology

## 2019-02-15 ENCOUNTER — Other Ambulatory Visit: Payer: Self-pay | Admitting: Radiation Oncology

## 2019-02-15 DIAGNOSIS — C155 Malignant neoplasm of lower third of esophagus: Secondary | ICD-10-CM | POA: Diagnosis not present

## 2019-02-15 DIAGNOSIS — Z510 Encounter for antineoplastic radiation therapy: Secondary | ICD-10-CM | POA: Diagnosis not present

## 2019-02-15 MED ORDER — SUCRALFATE 1 G PO TABS: 1.0000 g | ORAL_TABLET | Freq: Four times a day (QID) | ORAL | 2 refills | Status: DC

## 2019-02-18 ENCOUNTER — Other Ambulatory Visit: Payer: Self-pay

## 2019-02-18 ENCOUNTER — Ambulatory Visit
Admission: RE | Admit: 2019-02-18 | Discharge: 2019-02-18 | Disposition: A | Discharge status: Home / Self Care | Payer: Medicare Other | Source: Ambulatory Visit | Attending: Radiation Oncology | Admitting: Radiation Oncology

## 2019-02-18 DIAGNOSIS — Z510 Encounter for antineoplastic radiation therapy: Secondary | ICD-10-CM | POA: Diagnosis not present

## 2019-02-18 DIAGNOSIS — C155 Malignant neoplasm of lower third of esophagus: Secondary | ICD-10-CM | POA: Diagnosis not present

## 2019-02-19 ENCOUNTER — Ambulatory Visit
Admission: RE | Admit: 2019-02-19 | Discharge: 2019-02-19 | Disposition: A | Discharge status: Home / Self Care | Payer: Medicare Other | Source: Ambulatory Visit | Attending: Radiation Oncology | Admitting: Radiation Oncology

## 2019-02-19 ENCOUNTER — Inpatient Hospital Stay: Payer: Medicare Other

## 2019-02-19 ENCOUNTER — Other Ambulatory Visit: Payer: Medicare Other

## 2019-02-19 VITALS — BP 125/60 | HR 73 | Temp 98.7°F | Resp 16

## 2019-02-19 DIAGNOSIS — Z5111 Encounter for antineoplastic chemotherapy: Secondary | ICD-10-CM | POA: Diagnosis not present

## 2019-02-19 DIAGNOSIS — C775 Secondary and unspecified malignant neoplasm of intrapelvic lymph nodes: Secondary | ICD-10-CM | POA: Diagnosis not present

## 2019-02-19 DIAGNOSIS — Z95828 Presence of other vascular implants and grafts: Secondary | ICD-10-CM

## 2019-02-19 DIAGNOSIS — C155 Malignant neoplasm of lower third of esophagus: Secondary | ICD-10-CM | POA: Diagnosis not present

## 2019-02-19 DIAGNOSIS — C3490 Malignant neoplasm of unspecified part of unspecified bronchus or lung: Secondary | ICD-10-CM

## 2019-02-19 DIAGNOSIS — R197 Diarrhea, unspecified: Secondary | ICD-10-CM

## 2019-02-19 DIAGNOSIS — C7951 Secondary malignant neoplasm of bone: Secondary | ICD-10-CM | POA: Diagnosis not present

## 2019-02-19 DIAGNOSIS — K529 Noninfective gastroenteritis and colitis, unspecified: Secondary | ICD-10-CM

## 2019-02-19 DIAGNOSIS — Z7189 Other specified counseling: Secondary | ICD-10-CM

## 2019-02-19 DIAGNOSIS — Z510 Encounter for antineoplastic radiation therapy: Secondary | ICD-10-CM | POA: Diagnosis not present

## 2019-02-19 DIAGNOSIS — C3491 Malignant neoplasm of unspecified part of right bronchus or lung: Secondary | ICD-10-CM | POA: Diagnosis not present

## 2019-02-19 DIAGNOSIS — K521 Toxic gastroenteritis and colitis: Secondary | ICD-10-CM

## 2019-02-19 LAB — CMP (CANCER CENTER ONLY)
ALT: 15 U/L (ref 0–44)
AST: 21 U/L (ref 15–41)
Albumin: 3 g/dL — ABNORMAL LOW (ref 3.5–5.0)
Alkaline Phosphatase: 56 U/L (ref 38–126)
Anion gap: 9 (ref 5–15)
BUN: 16 mg/dL (ref 8–23)
CO2: 23 mmol/L (ref 22–32)
Calcium: 7.9 mg/dL — ABNORMAL LOW (ref 8.9–10.3)
Chloride: 106 mmol/L (ref 98–111)
Creatinine: 0.91 mg/dL (ref 0.44–1.00)
GFR, Est AFR Am: >60 mL/min (ref >60)
GFR, Estimated: >60 mL/min (ref >60)
Glucose, Bld: 114 mg/dL — ABNORMAL HIGH (ref 70–99)
Potassium: 4 mmol/L (ref 3.5–5.1)
Sodium: 138 mmol/L (ref 135–145)
Total Bilirubin: 0.3 mg/dL (ref 0.3–1.2)
Total Protein: 6.7 g/dL (ref 6.5–8.1)

## 2019-02-19 LAB — CBC WITH DIFFERENTIAL/PLATELET
Abs Immature Granulocytes: 0.04 10*3/uL (ref 0.00–0.07)
BASOS ABS: 0 10*3/uL (ref 0.0–0.1)
Basophils Relative: 1 %
Eosinophils Absolute: 0.1 10*3/uL (ref 0.0–0.5)
Eosinophils Relative: 3 %
HCT: 34.4 % — ABNORMAL LOW (ref 36.0–46.0)
Hemoglobin: 10.6 g/dL — ABNORMAL LOW (ref 12.0–15.0)
IMMATURE GRANULOCYTES: 1 %
Lymphocytes Relative: 7 %
Lymphs Abs: 0.3 10*3/uL — ABNORMAL LOW (ref 0.7–4.0)
MCH: 26.8 pg (ref 26.0–34.0)
MCHC: 30.8 g/dL (ref 30.0–36.0)
MCV: 86.9 fL (ref 80.0–100.0)
Monocytes Absolute: 0.4 10*3/uL (ref 0.1–1.0)
Monocytes Relative: 9 %
Neutro Abs: 3.4 10*3/uL (ref 1.7–7.7)
Neutrophils Relative %: 79 %
Platelets: 131 10*3/uL — ABNORMAL LOW (ref 150–400)
RBC: 3.96 MIL/uL (ref 3.87–5.11)
RDW: 15.4 % (ref 11.5–15.5)
WBC: 4.3 10*3/uL (ref 4.0–10.5)
nRBC: 0 % (ref 0.0–0.2)

## 2019-02-19 LAB — MAGNESIUM: Magnesium: 1.8 mg/dL (ref 1.7–2.4)

## 2019-02-19 MED ORDER — SODIUM CHLORIDE 0.9% FLUSH
10.0000 mL | Freq: Once | INTRAVENOUS | Status: AC
  Administered 2019-02-19: 10 mL
  Filled 2019-02-19: qty 10

## 2019-02-19 MED ORDER — SODIUM CHLORIDE 0.9 % IV SOLN
20.0000 mg | Freq: Once | INTRAVENOUS | Status: AC
  Administered 2019-02-19: 20 mg via INTRAVENOUS
  Filled 2019-02-19: qty 2

## 2019-02-19 MED ORDER — SODIUM CHLORIDE 0.9% FLUSH
10.0000 mL | INTRAVENOUS | Status: DC | PRN
  Administered 2019-02-19: 10 mL
  Filled 2019-02-19: qty 10

## 2019-02-19 MED ORDER — DIPHENHYDRAMINE HCL 50 MG/ML IJ SOLN
50.0000 mg | Freq: Once | INTRAMUSCULAR | Status: AC
  Administered 2019-02-19: 50 mg via INTRAVENOUS

## 2019-02-19 MED ORDER — SODIUM CHLORIDE 0.9 % IV SOLN
20.0000 mg | Freq: Once | INTRAVENOUS | Status: AC
  Administered 2019-02-19: 20 mg via INTRAVENOUS
  Filled 2019-02-19: qty 20

## 2019-02-19 MED ORDER — SODIUM CHLORIDE 0.9 % IV SOLN
50.0000 mg/m2 | Freq: Once | INTRAVENOUS | Status: AC
  Administered 2019-02-19: 90 mg via INTRAVENOUS
  Filled 2019-02-19: qty 15

## 2019-02-19 MED ORDER — PALONOSETRON HCL INJECTION 0.25 MG/5ML
0.2500 mg | Freq: Once | INTRAVENOUS | Status: AC
  Administered 2019-02-19: 0.25 mg via INTRAVENOUS

## 2019-02-19 MED ORDER — SODIUM CHLORIDE 0.9 % IV SOLN
148.0000 mg | Freq: Once | INTRAVENOUS | Status: AC
  Administered 2019-02-19: 150 mg via INTRAVENOUS
  Filled 2019-02-19: qty 15

## 2019-02-19 MED ORDER — SODIUM CHLORIDE 0.9 % IV SOLN
Freq: Once | INTRAVENOUS | Status: DC
  Filled 2019-02-19: qty 250

## 2019-02-19 MED ORDER — HEPARIN SOD (PORK) LOCK FLUSH 100 UNIT/ML IV SOLN
500.0000 [IU] | Freq: Once | INTRAVENOUS | Status: AC | PRN
  Administered 2019-02-19: 500 [IU]
  Filled 2019-02-19: qty 5

## 2019-02-19 MED ORDER — DIPHENHYDRAMINE HCL 50 MG/ML IJ SOLN
INTRAMUSCULAR | Status: AC
  Filled 2019-02-19: qty 1

## 2019-02-19 MED ORDER — PALONOSETRON HCL INJECTION 0.25 MG/5ML
INTRAVENOUS | Status: AC
  Filled 2019-02-19: qty 5

## 2019-02-19 MED ORDER — FAMOTIDINE IN NACL 20-0.9 MG/50ML-% IV SOLN: 20.0000 mg | Freq: Once | INTRAVENOUS | Status: DC

## 2019-02-20 ENCOUNTER — Ambulatory Visit
Admission: RE | Admit: 2019-02-20 | Discharge: 2019-02-20 | Disposition: A | Discharge status: Home / Self Care | Payer: Medicare Other | Source: Ambulatory Visit | Attending: Radiation Oncology | Admitting: Radiation Oncology

## 2019-02-20 ENCOUNTER — Other Ambulatory Visit: Payer: Self-pay

## 2019-02-20 DIAGNOSIS — Z510 Encounter for antineoplastic radiation therapy: Secondary | ICD-10-CM | POA: Diagnosis not present

## 2019-02-20 DIAGNOSIS — C155 Malignant neoplasm of lower third of esophagus: Secondary | ICD-10-CM | POA: Diagnosis not present

## 2019-02-21 ENCOUNTER — Ambulatory Visit
Admission: RE | Admit: 2019-02-21 | Discharge: 2019-02-21 | Disposition: A | Discharge status: Home / Self Care | Payer: Medicare Other | Source: Ambulatory Visit | Attending: Radiation Oncology | Admitting: Radiation Oncology

## 2019-02-21 ENCOUNTER — Other Ambulatory Visit: Payer: Self-pay

## 2019-02-21 DIAGNOSIS — C155 Malignant neoplasm of lower third of esophagus: Secondary | ICD-10-CM | POA: Diagnosis not present

## 2019-02-21 DIAGNOSIS — Z510 Encounter for antineoplastic radiation therapy: Secondary | ICD-10-CM | POA: Diagnosis not present

## 2019-02-22 ENCOUNTER — Ambulatory Visit
Admission: RE | Admit: 2019-02-22 | Discharge: 2019-02-22 | Disposition: A | Discharge status: Home / Self Care | Payer: Medicare Other | Source: Ambulatory Visit | Attending: Radiation Oncology | Admitting: Radiation Oncology

## 2019-02-22 ENCOUNTER — Other Ambulatory Visit: Payer: Self-pay

## 2019-02-22 DIAGNOSIS — Z510 Encounter for antineoplastic radiation therapy: Secondary | ICD-10-CM | POA: Diagnosis not present

## 2019-02-22 DIAGNOSIS — C155 Malignant neoplasm of lower third of esophagus: Secondary | ICD-10-CM | POA: Diagnosis not present

## 2019-02-22 NOTE — Progress Notes (Signed)
[name]    HEMATOLOGY ONCOLOGY PROGRESS NOTE  Date of service:  02/25/19    Patient Care Team: [name], NP as PCP - General (Family Medicine) [name], MD as Consulting Physician (Hematology and Oncology)  CHIEF COMPLAINTS/PURPOSE OF CONSULTATION:  F/u for metastatic lung cancer  DIAGNOSIS:   #1 Metastatic non-small cell lung cancer with bilateral lung nodules and large metastatic lesion in the left Ilium. #2 Barrett's esophagus with some evidence of intramucosal adenocarcinoma of the esophagus. 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 Also had c diff colitis - treated   Current Treatment  1) Active surveillance 2) Xgeva 145m [place] q4weeks for bone metastases. 3) Sandostatin q4weeks for diarrhea - immune colitis  Previous Treatment  1 Palliative radiation therapy to the large left ilium metastases 2. IV Nivolumab x 20ycles (discontinued due to likely immune colitis) 3. Xgeva 1245mSC q4weeks for bone metastases.   HISTORY OF PRESENTING ILLNESS: (plz see my previous consultation for details of initial presentation)  INTERVAL HISTORY:   Cindy Byrd here for her scheduled follow-up for metastatic lung cancer, and newly diagnosed adenocarcinoma of the esophagus. The patient's last visit with [place] on 02/12/19. The pt reports that she is doing well overall.   The pt reports that she began feeling SOB 2-3 days ago, which is not bad when she sits, but presents when she stands up. She is not able to walk around without feeling SOB. She notes her appetite "is not so good," and she is moving her bowels. She is coughing up white phlegm, and denies her cough being new, thought the phlegm is new. She denies having any fevers. The pt notes that she has been at home and denies any travel or contact with individuals with infections. She feels that she is wheezing "a little," and does not have an  inhaler at home. She notes that her SOB has progressed over the last 2 days. She notes that her SOB has kept her from being able to function as well. She denies any CP.  The pt notes that she has not missed any doses of her Lovenox injections.   The pt is clear about her SOB not simply being fatigue.  Lab results today (02/25/19) of CBC w/diff and CMP is as follows: all values are WNL except for WBC at 2.6k, HGB at 11.2, HCT at 35.3, RDW at 15.9, PLT at 129k, Lymphs abs at 100, CO2 at 21, Glucose at 137, Calcium at 8.1, Albumin at 3.0. 02/25/19 Magnesium is pending  On review of systems, pt reports moving her bowels, SOB, continuing to eat, white phlegm production with stable cough, and denies CP, fevers, and any other symptoms.    MEDICAL HISTORY:  Past Medical History:  Diagnosis Date  . Barrett's esophagus   . Bone neoplasm 06/24/2015  . [facility]   metastatic poorly differentiated carcinoma. tumor left groin surgical removal with radiation tx.  . Cataract    BILATERAL  . Cigarette smoker two packs a day or less    Currently still smoking 2 PPD - Not interested in quitting at this time.  . Colitis 2017  . Colon polyps    hyperplastic, tubular adenomas, tubulovillous adenoma  . Cough, persistent    hx. lung cancer ? primary-being evaluated, unsure of primary site.  . Depression 06/24/2015  . Diverticulosis   . [place]  . Endometriosis  Hysterectomy with BSO at age 35 yrs  . Esophageal adenocarcinoma ([place]) 08/11/15   intramucosal  . Gastritis   . GERD (gastroesophageal reflux disease)   . H/O: pneumonia   . Hiatal hernia   . Hyperlipidemia   . Hypertension 06/24/2015   likely improved incidental to 40 lbs weight loss from her neoplasm. No Longer taking med for this as of 08-06-15  . IBS (irritable bowel syndrome)   . Pain    left hip-persistent"tumor of bone"-radiation tx. 10.  . Vitamin D deficiency disease    SURGICAL HISTORY: Past Surgical History:   Procedure Laterality Date  . BARTHOLIN GLAND CYST EXCISION  78 yo ago   Does not want if it was an infected cyst or tumor. Was soon as delivery  . BIOPSY  01/02/2019   Procedure: BIOPSY;  Surgeon: [name], MD;  Location: [facility];  Service: Gastroenterology;;  . CATARACT EXTRACTION    . COLONOSCOPY W/ POLYPECTOMY     multiple times - last done 09/2014 per patient.  . ESOPHAGOGASTRODUODENOSCOPY (EGD) WITH PROPOFOL N/A 08/11/2015   Procedure: ESOPHAGOGASTRODUODENOSCOPY (EGD) WITH PROPOFOL;  Surgeon: [name], MD;  Location: [facility];  Service: Gastroenterology;  Laterality: N/A;  . ESOPHAGOGASTRODUODENOSCOPY (EGD) WITH PROPOFOL N/A 01/02/2019   Procedure: ESOPHAGOGASTRODUODENOSCOPY (EGD) WITH PROPOFOL;  Surgeon: [name], MD;  Location: [facility];  Service: Gastroenterology;  Laterality: N/A;  . FLEXIBLE SIGMOIDOSCOPY N/A 06/24/2017   Procedure: FLEXIBLE SIGMOIDOSCOPY;  Surgeon: Manus Gunning, MD;  Location: [facility];  Service: Gastroenterology;  Laterality: N/A;  . GANGLION CYST EXCISION    . KNEE ARTHROSCOPY  age about 66 yrs  . TONSILLECTOMY    . TOTAL ABDOMINAL HYSTERECTOMY W/ BILATERAL SALPINGOOPHORECTOMY  at age 28 yrs   For endometriosis    SOCIAL HISTORY: Social History   Socioeconomic History  . Marital status: Widowed    Spouse name: Not on file  . Number of children: 2  . Years of education: Not on file  . Highest education level: Not on file  Occupational History  . Not on file  Social Needs  . Financial resource strain: Not on file  . Food insecurity:    Worry: Not on file    Inability: Not on file  . Transportation needs:    Medical: No    Non-medical: No  Tobacco Use  . Smoking status: Former Smoker    Packs/day: 1.00    Years: 60.00    Pack years: 60.00    Types: Cigarettes    Last attempt to quit: 12/06/2015    Years since quitting: 3.2  . Smokeless tobacco: Never Used  Substance and Sexual Activity  . Alcohol use: No     Alcohol/week: 0.0 standard drinks  . Drug use: No  . Sexual activity: Never  Lifestyle  . Physical activity:    Days per week: Not on file    Minutes per session: Not on file  . Stress: Not on file  Relationships  . Social connections:    Talks on phone: Not on file    Gets together: Not on file    Attends religious service: Not on file    Active member of club or organization: Not on file    Attends meetings of clubs or organizations: Not on file    Relationship status: Not on file  . Intimate partner violence:    Fear of current or ex partner: Not on file    Emotionally abused: Not on file  Physically abused: Not on file    Forced sexual activity: Not on file  Other Topics Concern  . Not on file  Social History Narrative  . Not on file    FAMILY HISTORY: Family History  Problem Relation Age of Onset  . Colon cancer Brother   . Colon cancer Brother   . Stroke Mother   . Colon cancer Father   . Breast cancer Daughter 68       ER/PR+ stage II    ALLERGIES:  is allergic to penicillins; remeron [mirtazapine]; and latex. patient wonders if she has a penicillin allergy but notes that she is uncertain about this.  MEDICATIONS:  Current Outpatient Medications  Medication Sig Dispense Refill  . amitriptyline (ELAVIL) 25 MG tablet     . amLODipine (NORVASC) 10 MG tablet TAKE 1 TABLET BY MOUTH EVERY DAY (Patient taking differently: Take 10 mg by mouth daily. ) 90 tablet 0  . Cholecalciferol (VITAMIN D3) 25 MCG (1000 UT) CAPS Take 1,000 Units by mouth daily.    . citalopram (CELEXA) 20 MG tablet Take 20 mg by mouth daily.    [name] dexamethasone (DECADRON) 4 MG tablet Take 2 tablets (8 mg total) by mouth daily. Start the day after chemotherapy for 2 days. 30 tablet 1  . FLUoxetine (PROZAC) 20 MG capsule Take 1 capsule (20 mg total) by mouth daily. 90 capsule 3  . lidocaine-prilocaine (EMLA) cream Apply to affected area once 30 g 3  . LORazepam (ATIVAN) 0.5 MG tablet Take 1  tablet (0.5 mg total) by mouth every 6 (six) hours as needed (Nausea or vomiting). 30 tablet 0  . omeprazole (PRILOSEC) 40 MG capsule TAKE 1 CAPSULE BY MOUTH EVERY DAY (Patient taking differently: Take 40 mg by mouth 2 (two) times daily. ) 90 capsule 2  . ondansetron (ZOFRAN) 8 MG tablet Take 1 tablet (8 mg total) by mouth 2 (two) times daily as needed for refractory nausea / vomiting. Start on day 3 after chemo. 30 tablet 1  . Probiotic Product (PROBIOTIC PO) Take 1 capsule by mouth 2 (two) times daily.    . prochlorperazine (COMPAZINE) 10 MG tablet Take 1 tablet (10 mg total) by mouth every 6 (six) hours as needed (Nausea or vomiting). 30 tablet 1  . sucralfate (CARAFATE) 1 g tablet Take 1 tablet (1 g total) by mouth 4 (four) times daily. 120 tablet 2  . traZODone (DESYREL) 50 MG tablet TAKE 1 TABLET BY MOUTH EVERYDAY AT BEDTIME (Patient taking differently: Take 50 mg by mouth at bedtime. ) 90 tablet 2  . Vitamin D, Ergocalciferol, (DRISDOL) 1.25 MG (50000 UT) CAPS capsule TAKE 1 CAPSULE BY MOUTH EVERY 7 DAYS    . XARELTO 20 MG TABS tablet TAKE 1 TABLET DAILY WITH SUPPER. DC LOVENOX AND START XARELTO AT THE SCHEDULED TIME AS INSTRUCTED. (Patient taking differently: Take 20 mg by mouth daily with supper. ) 90 tablet 1   No current facility-administered medications for this visit.     REVIEW OF SYSTEMS:    A 10+ POINT REVIEW OF SYSTEMS WAS OBTAINED including neurology, dermatology, psychiatry, cardiac, respiratory, lymph, extremities, GI, GU, Musculoskeletal, constitutional, breasts, reproductive, HEENT.  All pertinent positives are noted in the HPI.  All others are negative.   PHYSICAL EXAMINATION: ECOG PERFORMANCE STATUS: 2 - Symptomatic, <50% confined to bed  Vitals:   02/25/19 1122 02/25/19 1151  BP: 114/60 107/63  Pulse: 87 85  Resp: (!) 22 20  Temp: 99.1 F (37.3 C) 98.5 F (  36.9 C)  SpO2: 98% 100%   Filed Weights   02/25/19 1122  Weight: 144 lb 6.4 oz (65.5 kg)  . [name] Wt  Readings from Last 3 Encounters:  02/25/19 144 lb 6.4 oz (65.5 kg)  02/12/19 154 lb (69.9 kg)  01/23/19 149 lb 12.8 oz (67.9 kg)    GENERAL:alert, in no acute distress and comfortable SKIN: no acute rashes, no significant lesions EYES: conjunctiva are pink and non-injected, sclera anicteric OROPHARYNX: MMM, no exudates, no oropharyngeal erythema or ulceration NECK: supple, no JVD LYMPH:  no palpable lymphadenopathy in the cervical, axillary or inguinal regions LUNGS: few crackles in bases, no wheezes HEART: regular rate & rhythm ABDOMEN:  normoactive bowel sounds , non tender, not distended. No palpable hepatosplenomegaly.  Extremity: no pedal edema PSYCH: alert & oriented x 3 with fluent speech NEURO: no focal motor/sensory deficits   LABORATORY DATA:  I have reviewed the data as listed  . CBC Latest Ref Rng & Units 02/25/2019 02/19/2019 02/12/2019  WBC 4.0 - 10.5 K/uL 2.6(L) 4.3 4.9  Hemoglobin 12.0 - 15.0 g/dL 11.2(L) 10.6(L) 10.6(L)  Hematocrit 36.0 - 46.0 % 35.3(L) 34.4(L) 34.8(L)  Platelets 150 - 400 K/uL 129(L) 131(L) 228    . CMP Latest Ref Rng & Units 02/25/2019 02/19/2019 02/12/2019  Glucose 70 - 99 mg/dL 137(H) 114(H) 93  BUN 8 - 23 mg/dL 17 16 15   Creatinine 0.44 - 1.00 mg/dL 0.90 0.91 1.14(H)  Sodium 135 - 145 mmol/L 136 138 142  Potassium 3.5 - 5.1 mmol/L 4.1 4.0 4.3  Chloride 98 - 111 mmol/L 104 106 109  CO2 22 - 32 mmol/L 21(L) 23 24  Calcium 8.9 - 10.3 mg/dL 8.1(L) 7.9(L) 8.8(L)  Total Protein 6.5 - 8.1 g/dL 6.7 6.7 7.3  Total Bilirubin 0.3 - 1.2 mg/dL 0.4 0.3 0.3  Alkaline Phos 38 - 126 U/L 64 56 65  AST 15 - 41 U/L 23 21 18   ALT 0 - 44 U/L 18 15 10        01/02/19 Esophagus Biopsy:    RADIOGRAPHIC STUDIES:  .No results found.  ASSESSMENT & PLAN:   78 y.o. female with  #1 Metastatic poorly differentiated carcinoma with likely lung primary non-small cell lung cancer.   CT of the head with and without contrast showed no evidence of metastatic  disease. EGFR blood test mutation analysis negative. CT chest abdomen pelvis 04/19/2016 shows no evidence of disease progression. Patient tolerated Nivolumab very well but was discontinued when she developed grade 2 Immune colitis. Has been off Nivolumab for >6 months  CT chest abdomen pelvis on 06/24/2016 shows no evidence of new disease or progression of metastatic disease. CT chest abdomen pelvis 09/06/2016 shows 1. Mixed interval response to therapy. 2. There is a new left ventral chest wall lesion deep to the pectoralis musculature worrisome for metastatic disease. 3. Posterior lower lobe nodular densities are identified which may reflect areas of pulmonary metastasis. 4. Interval decrease in size of destructive lesion involving the left iliac bone.  CT chest abd pelvis 12/08/2016: Cystic mass involving the left ventral chest wall has resolved in the interval. Likely was a hematoma due to trauma. Interval increase in size of pleural base mass overlying the posterior and inferior left lower lobe. There is also a new left pleural effusion identified.  CT chest 02/01/2017: Residual irregular soft tissue thickening/volume loss and trace left pleural fluid at the base of the left hemithorax, overall improved in appearance from 12/08/2016. No measurable lesion.  CT chest  05/29/2017 shows no residual pleural based mass or significant pleural effusion in the left hemithorax. No evidence of thoracic metastatic disease. No evidence of progressive metastatic disease within the abdomen or pelvis. Mixed lytic and blastic lesion involving the left iliac bone and associated pathologic fracture are unchanged.   CT CAP 09/14/17 shows no new changes. She does have slight displacement of her fractured left iliac bone. Evidence of stable disease.   CT CAP 01/04/2018- No new or progressive metastatic disease. Stable large left iliac bone metastasis with associated chronic pathologic fracture.   CT chest/abd/pelvis done on  04/26/18 revealed Stable exam.  No new or progressive interval findings.  07/19/18 CT C/A/P revealed Stable exam.  No new or progressive interval findings. Large destructive left iliac lesion is similar to prior. Aortic Atherosclerosis and Emphysema.    11/06/18 CT C/A/P revealed Similar appearance of large mixed lytic and sclerotic lesion in the left ilium. No new metastatic lesions are otherwise noted elsewhere in the chest, abdomen or pelvis. 2. Interval development of thickening of the distal third of the esophagus. This is nonspecific, and could be related to underlying reflux esophagitis. However, if there is any clinical concern for Barrett's metaplasia or esophageal neoplasia, further evaluation with nonemergent endoscopy could be considered. 3. Aortic atherosclerosis, in addition to left main coronary artery disease. Assessment for potential risk factor modification, dietary therapy or pharmacologic therapy may be warranted, if clinically Indicated. 4. Diffuse bronchial wall thickening with mild to moderate centrilobular and paraseptal emphysema; imaging findings suggestive of underlying COPD. 5. Additional incidental findings, as above.   #2 Newly diagnosed Adenocarcinoma of the esophagus  Barrett's esophagus 4cms in the distal esophagus with low and high-grade dysplasia  01/02/19 Surgical pathology revealed adenocarcinoma of the esophagus   01/25/19 PET/CT revealed Distal esophageal primary, without hypermetabolic metastatic disease. 2. Chronic left iliac metastasis, as before. 3. Hypermetabolism within and superficial to the right gluteal musculature is most likely related to trauma and/or injection sites. 4. Aortic atherosclerosis, coronary artery atherosclerosis and emphysema.  #3 diarrhea-  now resolved was previously. 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 She was  previously on on Lialda, budesonide,probiotics and lomotil but not currently taking any of these. Plan -Continue Sandostatin every 4 weeks   #4 h/o diverticulitis and c dff colitis - now resolved Plan  -continue on sandostatin today and q4weeks.  -continue on Lialda  #5 DVT and PE  -continue on Xarelto - no issues with bleeding   PLAN: -Discussed pt labwork today, 02/25/19; WBC at 2.6k without neutropenia, HGB improved to 11.2, PLT stable at 129k. -Discussed that as the pt has new onset of SOB and cough, we will hold her fourth weekly Carboplatin and Taxol due today, and tentatively resume in one week -Recommend that the pt present to the ED today for a detailed evaluation with CT imaging due to her new SOB -Will need cardiac eval as well  -Pt began RT on 02/04/19 with Dr. Lisbeth Renshaw in [place] with [place] See every 4 weeks, no dental concerns at this time -Continue Sandostatin every 4 weeks at this time -Previously discussed the NCCN guidelines with the pt and my recommendation to pursue concurrent chemoradiation, not surgery, in the setting of the patient's stable metastatic lung cancer. The pt agrees with this recommendation and is also opposed to surgery herself. -Continue 2000 units Vitamin D every day, with dose  adjustment to maintain 25OH Vit D levels of 40-60 -Trazodone for sleep difficulty -Will see the pt back in one week   ER today for evaluation of acute new shortness of breath and cough Holding chemotherapy today RTC with Dr Irene Limbo in 1 week with labs with next dose of chemotherapy on 03/04/2019   All questions were answered. The patient knows to call the clinic with any problems, questions or concerns.  The total time spent in the appt was 25 minutes and more than 50% was on counseling and direct patient cares.   Sullivan Lone MD Cindy Hematology/Oncology Physician [facility]  (Office): [phone] (Work cell): [phone] (Fax):  [phone]  I, Baldwin Jamaica, am acting as a scribe for Dr. Sullivan Lone.   .I have reviewed the above documentation for accuracy and completeness, and I agree with the above. [name] MD

## 2019-02-25 ENCOUNTER — Other Ambulatory Visit: Payer: Medicare Other

## 2019-02-25 ENCOUNTER — Inpatient Hospital Stay: Payer: Medicare Other

## 2019-02-25 ENCOUNTER — Emergency Department (HOSPITAL_COMMUNITY): Payer: Medicare Other

## 2019-02-25 ENCOUNTER — Encounter (HOSPITAL_COMMUNITY): Payer: Self-pay

## 2019-02-25 ENCOUNTER — Other Ambulatory Visit: Payer: Self-pay

## 2019-02-25 ENCOUNTER — Ambulatory Visit: Payer: Medicare Other

## 2019-02-25 ENCOUNTER — Inpatient Hospital Stay (HOSPITAL_BASED_OUTPATIENT_CLINIC_OR_DEPARTMENT_OTHER): Payer: Medicare Other | Admitting: Hematology

## 2019-02-25 ENCOUNTER — Emergency Department (HOSPITAL_COMMUNITY)
Admission: EM | Admit: 2019-02-25 | Discharge: 2019-02-25 | Disposition: A | Discharge status: Home / Self Care | Payer: Medicare Other | Attending: Emergency Medicine | Admitting: Emergency Medicine

## 2019-02-25 VITALS — BP 107/63 | HR 85 | Temp 98.5°F | Resp 20 | Ht 68.5 in | Wt 144.4 lb

## 2019-02-25 VITALS — BP 114/60 | HR 87 | Temp 99.1°F | Resp 22

## 2019-02-25 DIAGNOSIS — R0600 Dyspnea, unspecified: Secondary | ICD-10-CM | POA: Diagnosis not present

## 2019-02-25 DIAGNOSIS — Z8501 Personal history of malignant neoplasm of esophagus: Secondary | ICD-10-CM | POA: Diagnosis not present

## 2019-02-25 DIAGNOSIS — K521 Toxic gastroenteritis and colitis: Secondary | ICD-10-CM

## 2019-02-25 DIAGNOSIS — I82401 Acute embolism and thrombosis of unspecified deep veins of right lower extremity: Secondary | ICD-10-CM | POA: Diagnosis not present

## 2019-02-25 DIAGNOSIS — Z7901 Long term (current) use of anticoagulants: Secondary | ICD-10-CM | POA: Diagnosis not present

## 2019-02-25 DIAGNOSIS — Z79899 Other long term (current) drug therapy: Secondary | ICD-10-CM | POA: Insufficient documentation

## 2019-02-25 DIAGNOSIS — C7951 Secondary malignant neoplasm of bone: Secondary | ICD-10-CM

## 2019-02-25 DIAGNOSIS — R197 Diarrhea, unspecified: Secondary | ICD-10-CM

## 2019-02-25 DIAGNOSIS — C155 Malignant neoplasm of lower third of esophagus: Secondary | ICD-10-CM

## 2019-02-25 DIAGNOSIS — Z87891 Personal history of nicotine dependence: Secondary | ICD-10-CM

## 2019-02-25 DIAGNOSIS — R0602 Shortness of breath: Secondary | ICD-10-CM

## 2019-02-25 DIAGNOSIS — I10 Essential (primary) hypertension: Secondary | ICD-10-CM | POA: Insufficient documentation

## 2019-02-25 DIAGNOSIS — R509 Fever, unspecified: Secondary | ICD-10-CM | POA: Diagnosis not present

## 2019-02-25 DIAGNOSIS — R05 Cough: Secondary | ICD-10-CM

## 2019-02-25 DIAGNOSIS — C3491 Malignant neoplasm of unspecified part of right bronchus or lung: Secondary | ICD-10-CM

## 2019-02-25 DIAGNOSIS — Z7189 Other specified counseling: Secondary | ICD-10-CM

## 2019-02-25 DIAGNOSIS — C774 Secondary and unspecified malignant neoplasm of inguinal and lower limb lymph nodes: Secondary | ICD-10-CM

## 2019-02-25 DIAGNOSIS — K529 Noninfective gastroenteritis and colitis, unspecified: Secondary | ICD-10-CM

## 2019-02-25 DIAGNOSIS — R531 Weakness: Secondary | ICD-10-CM | POA: Diagnosis not present

## 2019-02-25 DIAGNOSIS — C3490 Malignant neoplasm of unspecified part of unspecified bronchus or lung: Secondary | ICD-10-CM

## 2019-02-25 DIAGNOSIS — I2699 Other pulmonary embolism without acute cor pulmonale: Secondary | ICD-10-CM

## 2019-02-25 DIAGNOSIS — Z95828 Presence of other vascular implants and grafts: Secondary | ICD-10-CM

## 2019-02-25 LAB — COMPREHENSIVE METABOLIC PANEL
ALT: 19 U/L (ref 0–44)
ANION GAP: 8 (ref 5–15)
AST: 22 U/L (ref 15–41)
Albumin: 3.1 g/dL — ABNORMAL LOW (ref 3.5–5.0)
Alkaline Phosphatase: 52 U/L (ref 38–126)
BUN: 20 mg/dL (ref 8–23)
CO2: 23 mmol/L (ref 22–32)
Calcium: 8 mg/dL — ABNORMAL LOW (ref 8.9–10.3)
Chloride: 104 mmol/L (ref 98–111)
Creatinine, Ser: 0.9 mg/dL (ref 0.44–1.00)
GFR calc Af Amer: >60 mL/min (ref >60)
Glucose, Bld: 111 mg/dL — ABNORMAL HIGH (ref 70–99)
Potassium: 3.9 mmol/L (ref 3.5–5.1)
Sodium: 135 mmol/L (ref 135–145)
Total Bilirubin: 0.6 mg/dL (ref 0.3–1.2)
Total Protein: 6.4 g/dL — ABNORMAL LOW (ref 6.5–8.1)

## 2019-02-25 LAB — CMP (CANCER CENTER ONLY)
ALT: 18 U/L (ref 0–44)
AST: 23 U/L (ref 15–41)
Albumin: 3 g/dL — ABNORMAL LOW (ref 3.5–5.0)
Alkaline Phosphatase: 64 U/L (ref 38–126)
Anion gap: 11 (ref 5–15)
BUN: 17 mg/dL (ref 8–23)
CO2: 21 mmol/L — ABNORMAL LOW (ref 22–32)
CREATININE: 0.9 mg/dL (ref 0.44–1.00)
Calcium: 8.1 mg/dL — ABNORMAL LOW (ref 8.9–10.3)
Chloride: 104 mmol/L (ref 98–111)
Glucose, Bld: 137 mg/dL — ABNORMAL HIGH (ref 70–99)
Potassium: 4.1 mmol/L (ref 3.5–5.1)
Sodium: 136 mmol/L (ref 135–145)
Total Bilirubin: 0.4 mg/dL (ref 0.3–1.2)
Total Protein: 6.7 g/dL (ref 6.5–8.1)

## 2019-02-25 LAB — CBC WITH DIFFERENTIAL/PLATELET
Abs Immature Granulocytes: 0.02 10*3/uL (ref 0.00–0.07)
Abs Immature Granulocytes: 0.03 10*3/uL (ref 0.00–0.07)
BASOS PCT: 1 %
Basophils Absolute: 0 10*3/uL (ref 0.0–0.1)
Basophils Absolute: 0 10*3/uL (ref 0.0–0.1)
Basophils Relative: 0 %
Eosinophils Absolute: 0.1 10*3/uL (ref 0.0–0.5)
Eosinophils Absolute: 0.1 10*3/uL (ref 0.0–0.5)
Eosinophils Relative: 3 %
Eosinophils Relative: 2 %
HCT: 35.3 % — ABNORMAL LOW (ref 36.0–46.0)
HCT: 33.8 % — ABNORMAL LOW (ref 36.0–46.0)
HEMOGLOBIN: 11.2 g/dL — AB (ref 12.0–15.0)
Hemoglobin: 10.4 g/dL — ABNORMAL LOW (ref 12.0–15.0)
Immature Granulocytes: 1 %
Immature Granulocytes: 1 %
Lymphocytes Relative: 4 %
Lymphocytes Relative: 5 %
Lymphs Abs: 0.1 10*3/uL — ABNORMAL LOW (ref 0.7–4.0)
Lymphs Abs: 0.1 10*3/uL — ABNORMAL LOW (ref 0.7–4.0)
MCH: 26.9 pg (ref 26.0–34.0)
MCH: 26.6 pg (ref 26.0–34.0)
MCHC: 31.7 g/dL (ref 30.0–36.0)
MCHC: 30.8 g/dL (ref 30.0–36.0)
MCV: 84.7 fL (ref 80.0–100.0)
MCV: 86.4 fL (ref 80.0–100.0)
MONO ABS: 0.1 10*3/uL (ref 0.1–1.0)
Monocytes Absolute: 0.1 10*3/uL (ref 0.1–1.0)
Monocytes Relative: 5 %
Monocytes Relative: 7 %
NRBC: 0 % (ref 0.0–0.2)
Neutro Abs: 2.3 10*3/uL (ref 1.7–7.7)
Neutro Abs: 1.8 10*3/uL (ref 1.7–7.7)
Neutrophils Relative %: 87 %
Neutrophils Relative %: 84 %
Platelets: 129 10*3/uL — ABNORMAL LOW (ref 150–400)
Platelets: 120 10*3/uL — ABNORMAL LOW (ref 150–400)
RBC: 4.17 MIL/uL (ref 3.87–5.11)
RBC: 3.91 MIL/uL (ref 3.87–5.11)
RDW: 15.9 % — ABNORMAL HIGH (ref 11.5–15.5)
RDW: 15.9 % — ABNORMAL HIGH (ref 11.5–15.5)
WBC: 2.6 10*3/uL — ABNORMAL LOW (ref 4.0–10.5)
WBC: 2.1 10*3/uL — ABNORMAL LOW (ref 4.0–10.5)
nRBC: 0 % (ref 0.0–0.2)

## 2019-02-25 LAB — TROPONIN I

## 2019-02-25 LAB — LACTIC ACID, PLASMA: Lactic Acid, Venous: 2.1 mmol/L (ref 0.5–1.9)

## 2019-02-25 LAB — MAGNESIUM: Magnesium: 1.6 mg/dL — ABNORMAL LOW (ref 1.7–2.4)

## 2019-02-25 LAB — BRAIN NATRIURETIC PEPTIDE: B Natriuretic Peptide: 46.6 pg/mL (ref 0.0–100.0)

## 2019-02-25 MED ORDER — HEPARIN SOD (PORK) LOCK FLUSH 100 UNIT/ML IV SOLN
500.0000 [IU] | Freq: Once | INTRAVENOUS | Status: DC
  Filled 2019-02-25: qty 5

## 2019-02-25 MED ORDER — SODIUM CHLORIDE (PF) 0.9 % IJ SOLN
INTRAMUSCULAR | Status: AC
  Filled 2019-02-25: qty 50

## 2019-02-25 MED ORDER — IOHEXOL 350 MG/ML SOLN
100.0000 mL | Freq: Once | INTRAVENOUS | Status: AC | PRN
  Administered 2019-02-25: 100 mL via INTRAVENOUS

## 2019-02-25 MED ORDER — SODIUM CHLORIDE 0.9% FLUSH
10.0000 mL | Freq: Once | INTRAVENOUS | Status: AC
  Administered 2019-02-25: 10 mL
  Filled 2019-02-25: qty 10

## 2019-02-25 MED ORDER — HEPARIN SOD (PORK) LOCK FLUSH 100 UNIT/ML IV SOLN
500.0000 [IU] | Freq: Once | INTRAVENOUS | Status: AC
  Administered 2019-02-25: 500 [IU]
  Filled 2019-02-25: qty 5

## 2019-02-25 NOTE — ED Notes (Signed)
Pt was able to ambulate with no assistance. Had a difficult time breathing when standing still but when told to breath in through their nose and out through their mouth she began to calm down.

## 2019-02-25 NOTE — ED Notes (Signed)
Patient transported to CT 

## 2019-02-25 NOTE — ED Provider Notes (Signed)
5:30 PM- CT for PE ordered. "Fever." On Xarelto. Ambulate prior to d/c.     Patient Vitals for the past 24 hrs:  BP Temp Temp src Pulse Resp SpO2 Height Weight  02/25/19 1900 118/64 - - 79 18 98 % - -  02/25/19 1839 122/62 - - 88 (!) 22 100 % - -  02/25/19 1836 122/62 - - 83 (!) 21 99 % - -  02/25/19 1730 120/62 - - 80 16 99 % - -  02/25/19 1600 113/67 - - 73 (!) 23 98 % - -  02/25/19 1500 105/60 - - 79 (!) 21 99 % - -  02/25/19 1424 117/63 - - 78 15 99 % - -  02/25/19 1337 109/74 98.4 F (36.9 C) Oral 77 (!) 24 100 % - -  02/25/19 1326 - - - - - - 5' 8.5" (1.74 m) 65.3 kg  02/25/19 1321 - - - - - 100 % - -    7:21 PM Reevaluation with update and discussion. After initial assessment and treatment, an updated evaluation reveals patient is currently hungry and thirsty and feels like she is well enough to go home.  She has a follow-up appointment with her oncologist, this week.  She is encouraged to keep that.  Findings discussed and questions answered. Daleen Bo   Medical Decision Making: Specific dyspnea, with reassuring evaluation.  Doubt PE, pneumonia or significant bronchitis.  No evidence for acute bacterial infection or metabolic instability.  CRITICAL CARE-no Performed by: Daleen Bo  Nursing Notes Reviewed/ Care Coordinated Applicable Imaging Reviewed Interpretation of Laboratory Data incorporated into ED treatment  The patient appears reasonably screened and/or stabilized for discharge and I doubt any other medical condition or other [facility] requiring further screening, evaluation, or treatment in the ED at this time prior to discharge.  Plan: Home Medications-continue current; Home Treatments-rest, fluids; return here if the recommended treatment, does not improve the symptoms; Recommended follow up-PCP and oncology, as scheduled, and as needed.     Daleen Bo, MD 02/25/19 [id number]

## 2019-02-25 NOTE — ED Notes (Signed)
Bed: ER15 Expected date:  Expected time:  Means of arrival:  Comments: 78 yo ca center, SOC, cough

## 2019-02-25 NOTE — ED Notes (Signed)
Date and time results received: 02/25/19 1818 (use smartphrase ".now" to insert current time)  Test: lactic acid Critical Value: 2.1  Name of Provider Notified: Dr. Crosby Oyster  Orders Received? Or Actions Taken?: reported lactic acid 2.1 to Dr. Crosby Oyster.

## 2019-02-25 NOTE — ED Triage Notes (Signed)
Pt seen at [facility] today for treatment. Labs drawn however Treatment cancelled. MD desired pt to be evaluated in ED for shortness of breath, generalized weakness, fever since Friday. Last CHEMO/ Radiation last Monday. Denies N/V/D.

## 2019-02-25 NOTE — ED Notes (Signed)
Patient is in radiology.

## 2019-02-25 NOTE — ED Provider Notes (Signed)
[facility] DEPT Provider Note   CSN: [id number] Arrival date & time: 02/25/19  1319    History   Chief Complaint Chief Complaint  Patient presents with  . Esophageal Cancer  . Shortness of Breath  . Fever  . Weakness    HPI Cindy Byrd is a 78 y.o. female.     HPI   Late Friday evening developed shortness of breath, worse yesterday Standing up, exerting self made it worse. Not worse laying down flat Cough, mild off on and on, now spitting up white mucus about a month ago, no hemoptysis No didn't feel like had fever, but was up a little bit Chronic sore throat from radiation, congestion Lightheadedness started Friday No chest pain, sore from radiation No body aches, nausea or vomiting No leg pain or swelling Low appetite, since middle of last week, things don't taste right, similar to Friday radiation, last Tuesday chemo On xarelto, has not missed doses Mild wheezing 10/10 dyspnea when standing   Past Medical History:  Diagnosis Date  . Barrett's esophagus   . Bone neoplasm 06/24/2015  . Cancer [facility])    metastatic poorly differentiated carcinoma. tumor left groin surgical removal with radiation tx.  . Cataract    BILATERAL  . Cigarette smoker two packs a day or less    Currently still smoking 2 PPD - Not interested in quitting at this time.  . Colitis 2017  . Colon polyps    hyperplastic, tubular adenomas, tubulovillous adenoma  . Cough, persistent    hx. lung cancer ? primary-being evaluated, unsure of primary site.  . Depression 06/24/2015  . Diverticulosis   . Emphysema of lung ([place])   . Endometriosis    Hysterectomy with BSO at age 74 yrs  . Esophageal adenocarcinoma ([place]) 08/11/15   intramucosal  . Gastritis   . GERD (gastroesophageal reflux disease)   . H/O: pneumonia   . Hiatal hernia   . Hyperlipidemia   . Hypertension 06/24/2015   likely improved incidental to 40 lbs weight loss from her neoplasm. No Longer  taking med for this as of 08-06-15  . IBS (irritable bowel syndrome)   . Pain    left hip-persistent"tumor of bone"-radiation tx. 10.  . Vitamin D deficiency disease     Patient Active Problem List   Diagnosis Date Noted  . Malignant neoplasm of distal third of esophagus ([place]) 01/14/2019  . Counseling regarding advance care planning and goals of care 01/14/2019  . Abnormal finding on imaging   . Esophageal mass   . GAD (generalized anxiety disorder) 10/24/2018  . Hyperlipidemia 04/23/2018  . Chronic kidney disease 04/23/2018  . Elevated TSH 04/17/2018  . Cough 02/21/2018  . Elevated serum creatinine 02/21/2018  . Health care maintenance 01/15/2018  . Malignant neoplasm of lung ([place])   . Colitis 06/23/2017  . Left lower quadrant pain 06/23/2017  . Fracture of left iliac crest ([place]) 06/23/2017  . Metastatic lung cancer (metastasis from lung to other site), unspecified laterality ([place])   . Hypocalcemia 09/08/2016  . Vitamin D deficiency 09/08/2016  . New onset a-fib ([place]) 09/08/2016  . Chest pain   . Thrush of mouth and esophagus ([place])   . Protein-calorie malnutrition, severe ([place])   . Colitis determined by colorectal biopsy   . Bilateral pulmonary embolism ([place]) 09/07/2016  . DVT of lower extremity, bilateral ([place]) 09/07/2016  . Palliative care by specialist   . Goals of care, counseling/discussion   . Advance care planning   .  Diarrhea   . Ulcerative pancolitis without complication ([place])   . Cancer ([place])   . Pressure injury of skin 09/02/2016  . HCAP (healthcare-associated pneumonia) 09/01/2016  . ARF (acute renal failure) ([place]) 09/01/2016  . Nausea with vomiting 07/18/2016  . Dehydration 07/18/2016  . Hypokalemia 07/18/2016  . Hypoalbuminemia due to protein-calorie malnutrition ([place]) 07/18/2016  . Peripheral edema 07/18/2016  . Diarrhea due to drug 05/19/2016  . Port catheter in place 04/07/2016  . Nicotine addiction 01/19/2016  . Barrett's esophagus determined by  biopsy 09/04/2015  . Gastritis   . Primary malignant neoplasm of lung metastatic to other site ([place]) 08/06/2015  . Esophageal reflux 08/04/2015  . Bone metastasis ([place]) 06/24/2015  . Neoplasm related pain 06/24/2015  . Protein calorie malnutrition ([place]) 06/24/2015  . Anorexia 06/24/2015  . Heavy smoker (more than 20 cigarettes per day) 06/24/2015  . Depression 06/24/2015  . HTN (hypertension) 06/24/2015    Past Surgical History:  Procedure Laterality Date  . BARTHOLIN GLAND CYST EXCISION  78 yo ago   Does not want if it was an infected cyst or tumor. Was soon as delivery  . BIOPSY  01/02/2019   Procedure: BIOPSY;  Surgeon: [name], MD;  Location: [facility];  Service: Gastroenterology;;  . CATARACT EXTRACTION    . COLONOSCOPY W/ POLYPECTOMY     multiple times - last done 09/2014 per patient.  . ESOPHAGOGASTRODUODENOSCOPY (EGD) WITH PROPOFOL N/A 08/11/2015   Procedure: ESOPHAGOGASTRODUODENOSCOPY (EGD) WITH PROPOFOL;  Surgeon: [name], MD;  Location: [facility];  Service: Gastroenterology;  Laterality: N/A;  . ESOPHAGOGASTRODUODENOSCOPY (EGD) WITH PROPOFOL N/A 01/02/2019   Procedure: ESOPHAGOGASTRODUODENOSCOPY (EGD) WITH PROPOFOL;  Surgeon: [name], MD;  Location: [facility];  Service: Gastroenterology;  Laterality: N/A;  . FLEXIBLE SIGMOIDOSCOPY N/A 06/24/2017   Procedure: FLEXIBLE SIGMOIDOSCOPY;  Surgeon: Manus Gunning, MD;  Location: [facility];  Service: Gastroenterology;  Laterality: N/A;  . GANGLION CYST EXCISION    . KNEE ARTHROSCOPY  age about 64 yrs  . TONSILLECTOMY    . TOTAL ABDOMINAL HYSTERECTOMY W/ BILATERAL SALPINGOOPHORECTOMY  at age 56 yrs   For endometriosis     OB History   No obstetric history on file.      Home Medications    Prior to Admission medications   Medication Sig Start Date End Date Taking? Authorizing Provider  amitriptyline (ELAVIL) 25 MG tablet Take 25 mg by mouth at bedtime.  01/15/19  Yes [provider]  amLODipine (NORVASC) 10 MG tablet TAKE 1 TABLET BY MOUTH EVERY DAY Patient taking differently: Take 10 mg by mouth daily.  11/06/18  Yes Danford, Valetta Fuller D, NP  Cholecalciferol (VITAMIN D3) 25 MCG (1000 UT) CAPS Take 1,000 Units by mouth daily.   Yes [provider]  dexamethasone (DECADRON) 4 MG tablet Take 2 tablets (8 mg total) by mouth daily. Start the day after chemotherapy for 2 days. 01/14/19  Yes [name], MD  FLUoxetine (PROZAC) 20 MG capsule Take 1 capsule (20 mg total) by mouth daily. 12/19/18  Yes Danford, Valetta Fuller D, NP  lidocaine-prilocaine (EMLA) cream Apply to affected area once 01/14/19  Yes Kale, Cloria Spring, MD  LORazepam (ATIVAN) 0.5 MG tablet Take 1 tablet (0.5 mg total) by mouth every 6 (six) hours as needed (Nausea or vomiting). 01/14/19  Yes [name], MD  naproxen sodium (ALEVE) 220 MG tablet Take 440 mg by mouth daily as needed (pain).   Yes [provider]  omeprazole (PRILOSEC) 40  MG capsule TAKE 1 CAPSULE BY MOUTH EVERY DAY Patient taking differently: Take 40 mg by mouth 2 (two) times daily.  01/15/18  Yes Danford, Valetta Fuller D, NP  Probiotic Product (PROBIOTIC PO) Take 1 capsule by mouth 2 (two) times daily.   Yes [provider]  sucralfate (CARAFATE) 1 g tablet Take 1 tablet (1 g total) by mouth 4 (four) times daily. 02/15/19  Yes Kyung Rudd, MD  traZODone (DESYREL) 50 MG tablet TAKE 1 TABLET BY MOUTH EVERYDAY AT BEDTIME Patient taking differently: Take 50 mg by mouth at bedtime.  07/04/18  Yes [name], MD  Vitamin D, Ergocalciferol, (DRISDOL) 1.25 MG (50000 UT) CAPS capsule Take 50,000 Units by mouth every 7 (seven) days.  01/05/19  Yes [provider]  XARELTO 20 MG TABS tablet TAKE 1 TABLET DAILY WITH SUPPER. DC LOVENOX AND START XARELTO AT THE SCHEDULED TIME AS INSTRUCTED. Patient taking differently: Take 20 mg by mouth daily with supper.  11/13/18  Yes [name], MD  ondansetron (ZOFRAN) 8 MG  tablet Take 1 tablet (8 mg total) by mouth 2 (two) times daily as needed for refractory nausea / vomiting. Start on day 3 after chemo. 01/14/19   [name], MD  prochlorperazine (COMPAZINE) 10 MG tablet Take 1 tablet (10 mg total) by mouth every 6 (six) hours as needed (Nausea or vomiting). 01/14/19   [name], MD    Family History Family History  Problem Relation Age of Onset  . Colon cancer Brother   . Colon cancer Brother   . Stroke Mother   . Colon cancer Father   . Breast cancer Daughter 1       ER/PR+ stage II    Social History Social History   Tobacco Use  . Smoking status: Former Smoker    Packs/day: 1.00    Years: 60.00    Pack years: 60.00    Types: Cigarettes    Last attempt to quit: 12/06/2015    Years since quitting: 3.2  . Smokeless tobacco: Never Used  Substance Use Topics  . Alcohol use: No    Alcohol/week: 0.0 standard drinks  . Drug use: No     Allergies   Penicillins; Remeron [mirtazapine]; and Latex   Review of Systems Review of Systems  Constitutional: Negative for fever (temp 99 at cancer center).  HENT: Positive for congestion (chronic) and sore throat (chronic).   Respiratory: Positive for shortness of breath and wheezing. Cough: very mild intermittent and present for one month.   Cardiovascular: Negative for chest pain.  Gastrointestinal: Positive for diarrhea (chronic). Negative for abdominal pain, constipation and vomiting.  Genitourinary: Negative for difficulty urinating.  Musculoskeletal: Negative for back pain.  Skin: Negative for rash.  Neurological: Negative for headaches.     Physical Exam Updated Vital Signs BP (!) 112/57 (BP Location: Left Arm)   Pulse 85   Temp 98.4 F (36.9 C) (Oral)   Resp 20   Ht 5' 8.5" (1.74 m)   Wt 65.3 kg   SpO2 99%   BMI 21.58 kg/m   Physical Exam Vitals signs and nursing note reviewed.  Constitutional:      General: She is not in acute distress.    Appearance: She is  well-developed. She is not diaphoretic.  HENT:     Head: Normocephalic and atraumatic.  Eyes:     Conjunctiva/sclera: Conjunctivae normal.  Neck:     Musculoskeletal: Normal range of motion.  Cardiovascular:  Rate and Rhythm: Normal rate and regular rhythm.     Heart sounds: Normal heart sounds. No murmur. No friction rub. No gallop.   Pulmonary:     Effort: Pulmonary effort is normal. No respiratory distress.     Breath sounds: Normal breath sounds. No wheezing or rales.  Abdominal:     General: There is no distension.     Palpations: Abdomen is soft.     Tenderness: There is no abdominal tenderness. There is no guarding.  Musculoskeletal:        General: No tenderness.  Skin:    General: Skin is warm and dry.     Findings: No erythema or rash.  Neurological:     Mental Status: She is alert and oriented to person, place, and time.      ED Treatments / Results  Labs (all labs ordered are listed, but only abnormal results are displayed) Labs Reviewed  CBC WITH DIFFERENTIAL/PLATELET - Abnormal; Notable for the following components:      Result Value   WBC 2.1 (*)    Hemoglobin 10.4 (*)    HCT 33.8 (*)    RDW 15.9 (*)    Platelets 120 (*)    Lymphs Abs 0.1 (*)    All other components within normal limits  COMPREHENSIVE METABOLIC PANEL - Abnormal; Notable for the following components:   Glucose, Bld 111 (*)    Calcium 8.0 (*)    Total Protein 6.4 (*)    Albumin 3.1 (*)    All other components within normal limits  LACTIC ACID, PLASMA - Abnormal; Notable for the following components:   Lactic Acid, Venous 2.1 (*)    All other components within normal limits  TROPONIN I  BRAIN NATRIURETIC PEPTIDE    EKG EKG Interpretation  Date/Time:  Monday February 25 2019 14:06:58 EDT Ventricular Rate:  80 PR Interval:    QRS Duration: 87 QT Interval:  392 QTC Calculation: 453 R Axis:   71 Text Interpretation:  Sinus rhythm No significant change since last tracing  Confirmed by Gareth Morgan [id number]) on 02/25/2019 3:13:54 PM   Radiology Dg Chest 2 View  Result Date: 02/25/2019 CLINICAL DATA:  SOB x 2 days - hx pulmonary embolisms - currently being treated for esophageal cancer - current smoker EXAM: CHEST - 2 VIEW COMPARISON:  06/28/2018 FINDINGS: Coarse linear opacities in the lung bases left greater than right as before. No confluent airspace disease or overt edema. Heart size normal.  Aortic Atherosclerosis (ICD10-170.0). No pneumothorax. No pleural effusion. Right IJ port catheter to the distal SVC. Visualized bones unremarkable. IMPRESSION: No acute cardiopulmonary disease. Electronically Signed   By: Lucrezia Europe M.D.   On: 02/25/2019 15:16   Ct Angio Chest Pe W And/or Wo Contrast  Result Date: 02/25/2019 CLINICAL DATA:  Shortness of breath and fever.  Esophageal carcinoma EXAM: CT ANGIOGRAPHY CHEST WITH CONTRAST TECHNIQUE: Multidetector CT imaging of the chest was performed using the standard protocol during bolus administration of intravenous contrast. Multiplanar CT image reconstructions and MIPs were obtained to evaluate the vascular anatomy. CONTRAST:  116mL OMNIPAQUE IOHEXOL 350 MG/ML SOLN COMPARISON:  PET-CT January 25, 2019; chest radiograph February 25, 2019 prior chest CT with contrast July 19, 2018 FINDINGS: Cardiovascular: No pulmonary embolus evident. Note that there is pulmonary vascular attenuation in the left lower lobe, also present previously. There is no thoracic aortic aneurysm or dissection. There are foci of calcification in the proximal visualized great vessels. There are foci aortic atherosclerosis. There  are foci of coronary artery calcification. No pericardial effusion or pericardial thickening is evident. Mediastinum/Nodes: Thyroid appears somewhat diminutive but unremarkable. There is no appreciable thoracic adenopathy. There is thickening in the wall of the distal esophagus at the site of known esophageal carcinoma. Lungs/Pleura:  There is underlying centrilobular emphysematous change. There are areas of lower lobe atelectatic change. No edema or consolidation. No pleural effusion or pleural thickening evident. There is a 4 mm nodular opacity in the lateral segment of the right lower lobe, also seen on recent studies. Upper Abdomen: There is aortic atherosclerosis in the upper abdomen region. Visualized upper abdominal structures otherwise appear unremarkable. Musculoskeletal: No blastic or lytic bone lesions evident. No appreciable chest wall lesions. Review of the MIP images confirms the above findings. IMPRESSION: 1. No demonstrable pulmonary embolus. No thoracic aortic aneurysm or dissection. There is aortic atherosclerosis. There are foci of great vessel and coronary artery calcification. 2. Thickening of the wall of the distal esophagus at the site of known esophageal carcinoma present. 3. Underlying centrilobular emphysematous change. Areas of atelectatic change. 4 mm nodular opacity right lower lobe, stable. A small metastasis cannot be excluded. 4.  No appreciable thoracic adenopathy. Aortic Atherosclerosis (ICD10-I70.0) and Emphysema (ICD10-J43.9). Electronically Signed   By: Lowella Grip III M.D.   On: 02/25/2019 18:25    Procedures Procedures (including critical care time)  Medications Ordered in ED Medications  iohexol (OMNIPAQUE) 350 MG/ML injection 100 mL (100 mLs Intravenous Contrast Given 02/25/19 1802)  heparin lock flush 100 unit/mL (500 Units Intracatheter Given 02/25/19 1940)     Initial Impression / Assessment and Plan / ED Course  I have reviewed the triage vital signs and the nursing notes.  Pertinent labs & imaging results that were available during my care of the patient were reviewed by me and considered in my medical decision making (see chart for details).        78yo female with complicated history above presents with concern for dyspnea and lightheadedness upon standing. DDx include  pneumonia, PE, ACS, CHF, pneumothorax, viral, COPD.  No subjective fevers, temperature 99 but no sign of fever. Does not meet criteria for flu or COVD testing. Mild pancytopenia present, anemia unlikely etiology for dyspnea.  CXR without abnormalities.  Troponin and BNP negative. Hx not consistent with ACS.   CT pending to evaluate for PE given cancer hx and symptoms.  If negative, would ambulate pt to consider admission versus discharge. Her vital signs and labs are stable.  Transferred to Dr. Eulis Foster with results and reevaluation pending.   Final Clinical Impressions(s) / ED Diagnoses   Final diagnoses:  Dyspnea, unspecified type    ED Discharge Orders    None       Gareth Morgan, MD 02/26/19 [phone]

## 2019-02-25 NOTE — Discharge Instructions (Signed)
Follow-up with your oncologist, and PCP as needed for problems and as scheduled.

## 2019-02-25 NOTE — ED Notes (Signed)
ED Provider at bedside. SCHLOSSMAN

## 2019-02-25 NOTE — ED Notes (Signed)
BLOOD CULTURE X 1  OBTAINED RT AC

## 2019-02-25 NOTE — ED Notes (Signed)
PT DID NOT GET FLU SHOT

## 2019-02-25 NOTE — Progress Notes (Signed)
Taken to [facility] ED, room 23 by w/c per Dr. Grier Mitts direction. Report given to Eddie North., RN.

## 2019-02-26 ENCOUNTER — Ambulatory Visit: Payer: Medicare Other

## 2019-02-26 ENCOUNTER — Telehealth: Payer: Self-pay | Admitting: Hematology

## 2019-02-26 NOTE — Telephone Encounter (Signed)
Called and scheduled appt per 3/23 los.  Patient f/u appt was added to Wednesday because she wanted to keep her treatments on Wednesday.  Patient aware of appt date and time.

## 2019-02-27 ENCOUNTER — Ambulatory Visit: Payer: Medicare Other

## 2019-02-27 ENCOUNTER — Other Ambulatory Visit: Payer: Self-pay | Admitting: Adult Health

## 2019-02-27 ENCOUNTER — Telehealth: Payer: Self-pay | Admitting: *Deleted

## 2019-02-27 ENCOUNTER — Telehealth: Payer: Self-pay | Admitting: Adult Health

## 2019-02-27 MED ORDER — ALBUTEROL SULFATE HFA 108 (90 BASE) MCG/ACT IN AERS: 1.0000 | INHALATION_SPRAY | Freq: Four times a day (QID) | RESPIRATORY_TRACT | 1 refills | Status: DC | PRN

## 2019-02-27 NOTE — Telephone Encounter (Signed)
Patient called states she went to ED Monday, 02/25/19 for SOB (DOS record in chart)--- x-rays, CT scan but no results or Rx given she was advised to contact her Cancer provider who in turn, told her to contact her PCP to request an (inhaler).  --Forwarding request to medical assistant / Provider for review & approval .  -- Please contact patient @ [phone] if any questions or concerns.  --glh

## 2019-02-27 NOTE — Telephone Encounter (Signed)
Spoke with the patient to see how she is feeling.  She let me know that she is still not feeling well.  She is having headache and SOB.  I asked her about seeing her PCP and she stated she is headed there today.  Will continue to follow as necessary.  Cindy Byrd. Leonie Green, BSN

## 2019-02-27 NOTE — Telephone Encounter (Signed)
Good Afternoon Tonya, Can you please call Ms. Canizares and share- I sent in Albuterol inhaler. Please tell her to remain well hydrated, rest and call us IF- Shortness of breath worsens She develops fever Cough worsens Thanks! Valetta Fuller

## 2019-02-27 NOTE — Telephone Encounter (Signed)
Please advise.  T. Izela Altier, CMA 

## 2019-02-28 ENCOUNTER — Ambulatory Visit: Payer: Medicare Other

## 2019-02-28 ENCOUNTER — Telehealth: Payer: Self-pay | Admitting: Radiation Oncology

## 2019-02-28 NOTE — Telephone Encounter (Signed)
Pt picked up the albuterol inhaler last night and used it once, which she states did help "a little".  However, she stood up last night, became dizzy and fell, hitting her back on a table.  Pt states that the SOB is 10/10, but only with exertion and that her SOB is "fine" as long as she is just sitting in a chair.  She states that she has been checking her temperature and it has been running in the 98s, is still having a significant cough with white, thick sputum production.  Pt states that when she last saw oncologist, she rated her SOB as a 10/10 as well and she has had no improvement since then.  Pt was seen at ED on 02/25/2019, and was told there was no reason for admission or COVID-19 or flu testing.  Case discussed with Mina Marble, NP.  See her note for further documentation.  [name], CMA

## 2019-02-28 NOTE — ED Notes (Signed)
Per cancer center-currently getting radiation for esophogeal cancer, states having SOB with exertion, fever-sending for Covid-19 work up/testing

## 2019-02-28 NOTE — Telephone Encounter (Signed)
I spoke with her Oncology team and we all agree that she needs COVID-19 testing and evaluation at ED for possible hospital admission. Oncology has agreed to call ED and request COVID-19 testing and will contact Cindy Byrd to advise her to proceed to ED.

## 2019-03-01 ENCOUNTER — Ambulatory Visit: Payer: Medicare Other

## 2019-03-04 ENCOUNTER — Other Ambulatory Visit: Payer: Self-pay

## 2019-03-04 ENCOUNTER — Ambulatory Visit
Admission: RE | Admit: 2019-03-04 | Discharge: 2019-03-04 | Disposition: A | Discharge status: Home / Self Care | Payer: Medicare Other | Source: Ambulatory Visit | Attending: Radiation Oncology | Admitting: Radiation Oncology

## 2019-03-04 DIAGNOSIS — C155 Malignant neoplasm of lower third of esophagus: Secondary | ICD-10-CM | POA: Diagnosis not present

## 2019-03-04 DIAGNOSIS — Z510 Encounter for antineoplastic radiation therapy: Secondary | ICD-10-CM | POA: Diagnosis not present

## 2019-03-05 ENCOUNTER — Other Ambulatory Visit: Payer: Self-pay

## 2019-03-05 ENCOUNTER — Other Ambulatory Visit: Payer: Self-pay | Admitting: Adult Health

## 2019-03-05 ENCOUNTER — Ambulatory Visit
Admission: RE | Admit: 2019-03-05 | Discharge: 2019-03-05 | Disposition: A | Discharge status: Home / Self Care | Payer: Medicare Other | Source: Ambulatory Visit | Attending: Radiation Oncology | Admitting: Radiation Oncology

## 2019-03-05 DIAGNOSIS — Z510 Encounter for antineoplastic radiation therapy: Secondary | ICD-10-CM | POA: Diagnosis not present

## 2019-03-05 DIAGNOSIS — C155 Malignant neoplasm of lower third of esophagus: Secondary | ICD-10-CM | POA: Diagnosis not present

## 2019-03-05 NOTE — Progress Notes (Signed)
[name]    HEMATOLOGY ONCOLOGY PROGRESS NOTE  Date of service:  03/06/19    Patient Care Team: [name], NP as PCP - General (Family Medicine) [name], MD as Consulting Physician (Hematology and Oncology)  CHIEF COMPLAINTS/PURPOSE OF CONSULTATION:  F/u for metastatic lung cancer  DIAGNOSIS:   #1 Metastatic non-small cell lung cancer with bilateral lung nodules and large metastatic lesion in the left Ilium. #2 Barrett's esophagus with some evidence of intramucosal adenocarcinoma of the esophagus. 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 Also had c diff colitis - treated   Current Treatment  1) Active surveillance 2) Xgeva 171m [place] q4weeks for bone metastases. 3) Sandostatin q4weeks for diarrhea - immune colitis  Previous Treatment  1 Palliative radiation therapy to the large left ilium metastases 2. IV Nivolumab x 20ycles (discontinued due to likely immune colitis) 3. Xgeva 129mSC q4weeks for bone metastases.   HISTORY OF PRESENTING ILLNESS: (plz see my previous consultation for details of initial presentation)  INTERVAL HISTORY:   Cindy Byrd here for her scheduled follow-up for metastatic lung cancer, and newly diagnosed adenocarcinoma of the esophagus. The patient's last visit with [facility] on 02/12/19. The pt reports that she is doing well overall. The patient's last visit with [facility] on 02/25/19.   The pt reports that she has continued to have SOB upon standing and walking in the interim. She notes that she has also continued to have a cough with whitish colored phlegm and a low grade ffever. She denies chest pain or other acutely new symptoms. The pt did present to the ED after our last visit for these symptoms, did not have viral work up, but was evaluated with a CTA Chest, as noted below.  Of note since the patient's last visit, pt has had a CTA Chest completed on 02/25/19  with results revealing No demonstrable pulmonary embolus. No thoracic aortic aneurysm or dissection. There is aortic atherosclerosis. There are foci of great vessel and coronary artery calcification. 2. Thickening of the wall of the distal esophagus at the site of known esophageal carcinoma present. 3. Underlying centrilobular emphysematous change. Areas of atelectatic change. 4 mm nodular opacity right lower lobe, stable. A small metastasis cannot be excluded. 4.  No appreciable thoracic adenopathy. Aortic Atherosclerosis and Emphysema.  Lab results today (03/06/19) of CBC w/diff and CMP is as follows: all values are WNL except for WBC at 3.2k, HGB at 10.6, HCT at 33.5, RDW at 16.8, Lymphs abs at 30, Glucose at 129, Creatinine at 1.14, Albumin at 2.9, GFR at 46. 03/06/19 Magnesium is at 1.5  On review of systems, pt reports SOB, productive cough, white phlegm, low-grade fever, and denies chest pain, and any other symptoms.   MEDICAL HISTORY:  Past Medical History:  Diagnosis Date  . Barrett's esophagus   . Bone neoplasm 06/24/2015  [facility]   metastatic poorly differentiated carcinoma. tumor left groin surgical removal with radiation tx.  . Cataract    BILATERAL  . Cigarette smoker two packs a day or less    Currently still smoking 2 PPD - Not interested in quitting at this time.  . Colitis 2017  . Colon polyps    hyperplastic, tubular adenomas, tubulovillous adenoma  . Cough, persistent    hx. lung cancer ? primary-being evaluated, unsure of primary site.  . Depression 06/24/2015  . Diverticulosis   . Emphysema of lung (  [place])   . Endometriosis    Hysterectomy with BSO at age 32 yrs  . Esophageal adenocarcinoma ([place]) 08/11/15   intramucosal  . Gastritis   . GERD (gastroesophageal reflux disease)   . H/O: pneumonia   . Hiatal hernia   . Hyperlipidemia   . Hypertension 06/24/2015   likely improved incidental to 40 lbs weight loss from her neoplasm. No Longer taking med for this as of  08-06-15  . IBS (irritable bowel syndrome)   . Pain    left hip-persistent"tumor of bone"-radiation tx. 10.  . Vitamin D deficiency disease    SURGICAL HISTORY: Past Surgical History:  Procedure Laterality Date  . BARTHOLIN GLAND CYST EXCISION  78 yo ago   Does not want if it was an infected cyst or tumor. Was soon as delivery  . BIOPSY  01/02/2019   Procedure: BIOPSY;  Surgeon: [name], MD;  Location: [facility];  Service: Gastroenterology;;  . CATARACT EXTRACTION    . COLONOSCOPY W/ POLYPECTOMY     multiple times - last done 09/2014 per patient.  . ESOPHAGOGASTRODUODENOSCOPY (EGD) WITH PROPOFOL N/A 08/11/2015   Procedure: ESOPHAGOGASTRODUODENOSCOPY (EGD) WITH PROPOFOL;  Surgeon: [name], MD;  Location: [facility];  Service: Gastroenterology;  Laterality: N/A;  . ESOPHAGOGASTRODUODENOSCOPY (EGD) WITH PROPOFOL N/A 01/02/2019   Procedure: ESOPHAGOGASTRODUODENOSCOPY (EGD) WITH PROPOFOL;  Surgeon: [name], MD;  Location: [facility];  Service: Gastroenterology;  Laterality: N/A;  . FLEXIBLE SIGMOIDOSCOPY N/A 06/24/2017   Procedure: FLEXIBLE SIGMOIDOSCOPY;  Surgeon: Manus Gunning, MD;  Location: [facility];  Service: Gastroenterology;  Laterality: N/A;  . GANGLION CYST EXCISION    . KNEE ARTHROSCOPY  age about 67 yrs  . TONSILLECTOMY    . TOTAL ABDOMINAL HYSTERECTOMY W/ BILATERAL SALPINGOOPHORECTOMY  at age 26 yrs   For endometriosis    SOCIAL HISTORY: Social History   Socioeconomic History  . Marital status: Widowed    Spouse name: Not on file  . Number of children: 2  . Years of education: Not on file  . Highest education level: Not on file  Occupational History  . Not on file  Social Needs  . Financial resource strain: Not on file  . Food insecurity:    Worry: Not on file    Inability: Not on file  . Transportation needs:    Medical: No    Non-medical: No  Tobacco Use  . Smoking status: Former Smoker    Packs/day: 1.00    Years: 60.00     Pack years: 60.00    Types: Cigarettes    Last attempt to quit: 12/06/2015    Years since quitting: 3.2  . Smokeless tobacco: Never Used  Substance and Sexual Activity  . Alcohol use: No    Alcohol/week: 0.0 standard drinks  . Drug use: No  . Sexual activity: Never  Lifestyle  . Physical activity:    Days per week: Not on file    Minutes per session: Not on file  . Stress: Not on file  Relationships  . Social connections:    Talks on phone: Not on file    Gets together: Not on file    Attends religious service: Not on file    Active member of club or organization: Not on file    Attends meetings of clubs or organizations: Not on file    Relationship status: Not on file  . Intimate partner violence:    Fear of current or ex partner: Not on file  Emotionally abused: Not on file    Physically abused: Not on file    Forced sexual activity: Not on file  Other Topics Concern  . Not on file  Social History Narrative  . Not on file    FAMILY HISTORY: Family History  Problem Relation Age of Onset  . Colon cancer Brother   . Colon cancer Brother   . Stroke Mother   . Colon cancer Father   . Breast cancer Daughter 1       ER/PR+ stage II    ALLERGIES:  is allergic to penicillins; remeron [mirtazapine]; and latex. patient wonders if she has a penicillin allergy but notes that she is uncertain about this.  MEDICATIONS:  Current Outpatient Medications  Medication Sig Dispense Refill  . albuterol (PROVENTIL HFA;VENTOLIN HFA) 108 (90 Base) MCG/ACT inhaler Inhale 1 puff into the lungs every 6 (six) hours as needed for wheezing or shortness of breath. 1 Inhaler 1  . amitriptyline (ELAVIL) 25 MG tablet Take 25 mg by mouth at bedtime.     [name] amLODipine (NORVASC) 10 MG tablet TAKE 1 TABLET BY MOUTH EVERY DAY 90 tablet 0  . Cholecalciferol (VITAMIN D3) 25 MCG (1000 UT) CAPS Take 1,000 Units by mouth daily.    [name] dexamethasone (DECADRON) 4 MG tablet Take 2 tablets (8 mg total) by mouth  daily. Start the day after chemotherapy for 2 days. 30 tablet 1  . FLUoxetine (PROZAC) 20 MG capsule Take 1 capsule (20 mg total) by mouth daily. 90 capsule 3  . lidocaine-prilocaine (EMLA) cream Apply to affected area once 30 g 3  . LORazepam (ATIVAN) 0.5 MG tablet Take 1 tablet (0.5 mg total) by mouth every 6 (six) hours as needed (Nausea or vomiting). 30 tablet 0  . naproxen sodium (ALEVE) 220 MG tablet Take 440 mg by mouth daily as needed (pain).    [name] omeprazole (PRILOSEC) 40 MG capsule TAKE 1 CAPSULE BY MOUTH EVERY DAY (Patient taking differently: Take 40 mg by mouth 2 (two) times daily. ) 90 capsule 2  . ondansetron (ZOFRAN) 8 MG tablet Take 1 tablet (8 mg total) by mouth 2 (two) times daily as needed for refractory nausea / vomiting. Start on day 3 after chemo. 30 tablet 1  . Probiotic Product (PROBIOTIC PO) Take 1 capsule by mouth 2 (two) times daily.    . prochlorperazine (COMPAZINE) 10 MG tablet Take 1 tablet (10 mg total) by mouth every 6 (six) hours as needed (Nausea or vomiting). 30 tablet 1  . sucralfate (CARAFATE) 1 g tablet Take 1 tablet (1 g total) by mouth 4 (four) times daily. 120 tablet 2  . traZODone (DESYREL) 50 MG tablet TAKE 1 TABLET BY MOUTH EVERYDAY AT BEDTIME (Patient taking differently: Take 50 mg by mouth at bedtime. ) 90 tablet 2  . Vitamin D, Ergocalciferol, (DRISDOL) 1.25 MG (50000 UT) CAPS capsule Take 50,000 Units by mouth every 7 (seven) days.     Alveda Reasons 20 MG TABS tablet TAKE 1 TABLET DAILY WITH SUPPER. DC LOVENOX AND START XARELTO AT THE SCHEDULED TIME AS INSTRUCTED. (Patient taking differently: Take 20 mg by mouth daily with supper. ) 90 tablet 1   No current facility-administered medications for this visit.     REVIEW OF SYSTEMS:    A 10+ POINT REVIEW OF SYSTEMS WAS OBTAINED including neurology, dermatology, psychiatry, cardiac, respiratory, lymph, extremities, GI, GU, Musculoskeletal, constitutional, breasts, reproductive, HEENT.  All pertinent positives  are noted in the HPI.  All others are  negative.   PHYSICAL EXAMINATION: ECOG PERFORMANCE STATUS: 2 - Symptomatic, <50% confined to bed  Vitals:   03/06/19 1254  BP: 110/70  Pulse: 90  Resp: 18  Temp: 98.1 F (36.7 C)  SpO2: 95%   Filed Weights   03/06/19 1254  Weight: 145 lb 11.2 oz (66.1 kg)  . [name] Wt Readings from Last 3 Encounters:  03/06/19 144 lb (65.3 kg)  03/06/19 145 lb 11.2 oz (66.1 kg)  02/25/19 144 lb (65.3 kg)   GENERAL:alert, in no acute distress and comfortable SKIN: no acute rashes, no significant lesions EYES: conjunctiva are pink and non-injected, sclera anicteric OROPHARYNX: MMM, no exudates, no oropharyngeal erythema or ulceration NECK: supple, no JVD LYMPH:  no palpable lymphadenopathy in the cervical, axillary or inguinal regions LUNGS: few crackles in bases, no wheezes HEART: regular rate & rhythm ABDOMEN:  normoactive bowel sounds , non tender, not distended. No palpable hepatosplenomegaly.  Extremity: no pedal edema PSYCH: alert & oriented x 3 with fluent speech NEURO: no focal motor/sensory deficits   LABORATORY DATA:  I have reviewed the data as listed  . CBC Latest Ref Rng & Units 03/06/2019 02/25/2019 02/25/2019  WBC 4.0 - 10.5 K/uL 3.2(L) 2.1(L) 2.6(L)  Hemoglobin 12.0 - 15.0 g/dL 10.6(L) 10.4(L) 11.2(L)  Hematocrit 36.0 - 46.0 % 33.5(L) 33.8(L) 35.3(L)  Platelets 150 - 400 K/uL 183 120(L) 129(L)    . CMP Latest Ref Rng & Units 03/06/2019 02/25/2019 02/25/2019  Glucose 70 - 99 mg/dL 129(H) 111(H) 137(H)  BUN 8 - 23 mg/dL 16 20 17   Creatinine 0.44 - 1.00 mg/dL 1.14(H) 0.90 0.90  Sodium 135 - 145 mmol/L 137 135 136  Potassium 3.5 - 5.1 mmol/L 3.5 3.9 4.1  Chloride 98 - 111 mmol/L 104 104 104  CO2 22 - 32 mmol/L 23 23 21(L)  Calcium 8.9 - 10.3 mg/dL 8.9 8.0(L) 8.1(L)  Total Protein 6.5 - 8.1 g/dL 6.7 6.4(L) 6.7  Total Bilirubin 0.3 - 1.2 mg/dL 0.3 0.6 0.4  Alkaline Phos 38 - 126 U/L 59 52 64  AST 15 - 41 U/L 25 22 23   ALT 0 - 44 U/L 13 19  18        01/02/19 Esophagus Biopsy:    RADIOGRAPHIC STUDIES:  .Dg Chest 2 View  Result Date: 02/25/2019 CLINICAL DATA:  SOB x 2 days - hx pulmonary embolisms - currently being treated for esophageal cancer - current smoker EXAM: CHEST - 2 VIEW COMPARISON:  06/28/2018 FINDINGS: Coarse linear opacities in the lung bases left greater than right as before. No confluent airspace disease or overt edema. Heart size normal.  Aortic Atherosclerosis (ICD10-170.0). No pneumothorax. No pleural effusion. Right IJ port catheter to the distal SVC. Visualized bones unremarkable. IMPRESSION: No acute cardiopulmonary disease. Electronically Signed   By: Lucrezia Europe M.D.   On: 02/25/2019 15:16   Ct Angio Chest Pe W And/or Wo Contrast  Result Date: 02/25/2019 CLINICAL DATA:  Shortness of breath and fever.  Esophageal carcinoma EXAM: CT ANGIOGRAPHY CHEST WITH CONTRAST TECHNIQUE: Multidetector CT imaging of the chest was performed using the standard protocol during bolus administration of intravenous contrast. Multiplanar CT image reconstructions and MIPs were obtained to evaluate the vascular anatomy. CONTRAST:  167m OMNIPAQUE IOHEXOL 350 MG/ML SOLN COMPARISON:  PET-CT January 25, 2019; chest radiograph February 25, 2019 prior chest CT with contrast July 19, 2018 FINDINGS: Cardiovascular: No pulmonary embolus evident. Note that there is pulmonary vascular attenuation in the left lower lobe, also present previously. There is no thoracic  aortic aneurysm or dissection. There are foci of calcification in the proximal visualized great vessels. There are foci aortic atherosclerosis. There are foci of coronary artery calcification. No pericardial effusion or pericardial thickening is evident. Mediastinum/Nodes: Thyroid appears somewhat diminutive but unremarkable. There is no appreciable thoracic adenopathy. There is thickening in the wall of the distal esophagus at the site of known esophageal carcinoma. Lungs/Pleura: There is  underlying centrilobular emphysematous change. There are areas of lower lobe atelectatic change. No edema or consolidation. No pleural effusion or pleural thickening evident. There is a 4 mm nodular opacity in the lateral segment of the right lower lobe, also seen on recent studies. Upper Abdomen: There is aortic atherosclerosis in the upper abdomen region. Visualized upper abdominal structures otherwise appear unremarkable. Musculoskeletal: No blastic or lytic bone lesions evident. No appreciable chest wall lesions. Review of the MIP images confirms the above findings. IMPRESSION: 1. No demonstrable pulmonary embolus. No thoracic aortic aneurysm or dissection. There is aortic atherosclerosis. There are foci of great vessel and coronary artery calcification. 2. Thickening of the wall of the distal esophagus at the site of known esophageal carcinoma present. 3. Underlying centrilobular emphysematous change. Areas of atelectatic change. 4 mm nodular opacity right lower lobe, stable. A small metastasis cannot be excluded. 4.  No appreciable thoracic adenopathy. Aortic Atherosclerosis (ICD10-I70.0) and Emphysema (ICD10-J43.9). Electronically Signed   By: Lowella Grip III M.D.   On: 02/25/2019 18:25    ASSESSMENT & PLAN:   78 y.o. female with  #1 Metastatic poorly differentiated carcinoma with likely lung primary non-small cell lung cancer.   CT of the head with and without contrast showed no evidence of metastatic disease. EGFR blood test mutation analysis negative. CT chest abdomen pelvis 04/19/2016 shows no evidence of disease progression. Patient tolerated Nivolumab very well but was discontinued when she developed grade 2 Immune colitis. Has been off Nivolumab for >6 months  CT chest abdomen pelvis on 06/24/2016 shows no evidence of new disease or progression of metastatic disease. CT chest abdomen pelvis 09/06/2016 shows 1. Mixed interval response to therapy. 2. There is a new left ventral chest  wall lesion deep to the pectoralis musculature worrisome for metastatic disease. 3. Posterior lower lobe nodular densities are identified which may reflect areas of pulmonary metastasis. 4. Interval decrease in size of destructive lesion involving the left iliac bone.  CT chest abd pelvis 12/08/2016: Cystic mass involving the left ventral chest wall has resolved in the interval. Likely was a hematoma due to trauma. Interval increase in size of pleural base mass overlying the posterior and inferior left lower lobe. There is also a new left pleural effusion identified.  CT chest 02/01/2017: Residual irregular soft tissue thickening/volume loss and trace left pleural fluid at the base of the left hemithorax, overall improved in appearance from 12/08/2016. No measurable lesion.  CT chest 05/29/2017 shows no residual pleural based mass or significant pleural effusion in the left hemithorax. No evidence of thoracic metastatic disease. No evidence of progressive metastatic disease within the abdomen or pelvis. Mixed lytic and blastic lesion involving the left iliac bone and associated pathologic fracture are unchanged.   CT CAP 09/14/17 shows no new changes. She does have slight displacement of her fractured left iliac bone. Evidence of stable disease.   CT CAP 01/04/2018- No new or progressive metastatic disease. Stable large left iliac bone metastasis with associated chronic pathologic fracture.   CT chest/abd/pelvis done on 04/26/18 revealed Stable exam.  No new or progressive interval  findings.  07/19/18 CT C/A/P revealed Stable exam.  No new or progressive interval findings. Large destructive left iliac lesion is similar to prior. Aortic Atherosclerosis and Emphysema.    11/06/18 CT C/A/P revealed Similar appearance of large mixed lytic and sclerotic lesion in the left ilium. No new metastatic lesions are otherwise noted elsewhere in the chest, abdomen or pelvis. 2. Interval development of thickening of the  distal third of the esophagus. This is nonspecific, and could be related to underlying reflux esophagitis. However, if there is any clinical concern for Barrett's metaplasia or esophageal neoplasia, further evaluation with nonemergent endoscopy could be considered. 3. Aortic atherosclerosis, in addition to left main coronary artery disease. Assessment for potential risk factor modification, dietary therapy or pharmacologic therapy may be warranted, if clinically Indicated. 4. Diffuse bronchial wall thickening with mild to moderate centrilobular and paraseptal emphysema; imaging findings suggestive of underlying COPD. 5. Additional incidental findings, as above.   #2 Newly diagnosed Adenocarcinoma of the esophagus  Barrett's esophagus 4cms in the distal esophagus with low and high-grade dysplasia  01/02/19 Surgical pathology revealed adenocarcinoma of the esophagus   01/25/19 PET/CT revealed Distal esophageal primary, without hypermetabolic metastatic disease. 2. Chronic left iliac metastasis, as before. 3. Hypermetabolism within and superficial to the right gluteal musculature is most likely related to trauma and/or injection sites. 4. Aortic atherosclerosis, coronary artery atherosclerosis and emphysema.  #3 diarrhea-  now resolved was previously. 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 She was previously on on Lialda, budesonide,probiotics and lomotil but not currently taking any of these. Plan -Continue Sandostatin every 4 weeks   #4 h/o diverticulitis and c diff colitis - now resolved Plan  -continue on sandostatin today and q4weeks.  -continue on Lialda  #5 DVT and PE  -continue on Xarelto - no issues with bleeding   PLAN: -Discussed pt labwork today, 03/06/19; WBC improved to 3.2k, PLT improved to 183k, other blood counts and chemistries are stable -Reviewed the 02/25/19  CTA Chest which revealed No demonstrable pulmonary embolus. No thoracic aortic aneurysm or dissection. There is aortic atherosclerosis. There are foci of great vessel and coronary artery calcification. 2. Thickening of the wall of the distal esophagus at the site of known esophageal carcinoma present. 3. Underlying centrilobular emphysematous change. Areas of atelectatic change. 4 mm nodular opacity right lower lobe, stable. A small metastasis cannot be excluded. 4.  No appreciable thoracic adenopathy. Aortic Atherosclerosis and Emphysema. -Discussed that as the pt's SOB and cough has not been evaluated in the interim, we will again hold her fourth weekly Carboplatin and Taxol due today.  -Discussed the patient's case with ED Physician, and pt will present to the ED today for further evaluation of her cough and SOB and consideration of COVID 19 testing. -Recommend that the pt present to the ED today for a detailed evaluation -Will need cardiac eval as well  -Pt began RT on 02/04/19 with Dr. Lisbeth Renshaw in [place] with Delton See every 4 weeks, no dental concerns at this time -Continue Sandostatin every 4 weeks at this time -depending on improvement in functional status will add back chemotherapy. -Continue 2000 units Vitamin D every day, with dose adjustment to maintain 25OH Vit D levels of 40-60 -Trazodone for sleep difficulty -Will see the pt back in in about 2 weeks if covid 19 testing negative  All questions were answered. The patient knows to  call the clinic with any problems, questions or concerns.  The total time spent in the appt was 25 minutes and more than 50% was on counseling and direct patient cares.   Sullivan Lone MD [place] Hematology/Oncology Physician [facility]  (Office): [phone] (Work cell): [phone] (Fax): [phone]  I, Baldwin Jamaica, am acting as a scribe for Dr. Sullivan Lone.   .I have reviewed the above documentation for accuracy  and completeness, and I agree with the above. [name] MD

## 2019-03-06 ENCOUNTER — Other Ambulatory Visit: Payer: Self-pay

## 2019-03-06 ENCOUNTER — Inpatient Hospital Stay: Payer: Medicare Other

## 2019-03-06 ENCOUNTER — Emergency Department (HOSPITAL_COMMUNITY): Payer: Medicare Other

## 2019-03-06 ENCOUNTER — Other Ambulatory Visit: Payer: Medicare Other

## 2019-03-06 ENCOUNTER — Ambulatory Visit: Payer: Medicare Other

## 2019-03-06 ENCOUNTER — Encounter (HOSPITAL_COMMUNITY): Payer: Self-pay

## 2019-03-06 ENCOUNTER — Ambulatory Visit
Admission: RE | Admit: 2019-03-06 | Discharge: 2019-03-06 | Disposition: A | Discharge status: Home / Self Care | Payer: Medicare Other | Source: Ambulatory Visit | Attending: Radiation Oncology | Admitting: Radiation Oncology

## 2019-03-06 ENCOUNTER — Inpatient Hospital Stay (HOSPITAL_BASED_OUTPATIENT_CLINIC_OR_DEPARTMENT_OTHER): Payer: Medicare Other | Admitting: Hematology

## 2019-03-06 ENCOUNTER — Telehealth: Payer: Self-pay | Admitting: Hematology

## 2019-03-06 ENCOUNTER — Ambulatory Visit: Payer: Medicare Other | Admitting: Hematology

## 2019-03-06 ENCOUNTER — Inpatient Hospital Stay: Payer: Medicare Other | Attending: Hematology

## 2019-03-06 ENCOUNTER — Emergency Department (HOSPITAL_COMMUNITY)
Admission: EM | Admit: 2019-03-06 | Discharge: 2019-03-06 | Disposition: A | Discharge status: Home / Self Care | Payer: Medicare Other | Attending: Emergency Medicine | Admitting: Emergency Medicine

## 2019-03-06 VITALS — BP 110/70 | HR 90 | Temp 98.1°F | Resp 18 | Ht 68.5 in | Wt 145.7 lb

## 2019-03-06 DIAGNOSIS — Z91040 Latex allergy status: Secondary | ICD-10-CM | POA: Insufficient documentation

## 2019-03-06 DIAGNOSIS — C7951 Secondary malignant neoplasm of bone: Secondary | ICD-10-CM

## 2019-03-06 DIAGNOSIS — Z20828 Contact with and (suspected) exposure to other viral communicable diseases: Secondary | ICD-10-CM | POA: Insufficient documentation

## 2019-03-06 DIAGNOSIS — N189 Chronic kidney disease, unspecified: Secondary | ICD-10-CM | POA: Insufficient documentation

## 2019-03-06 DIAGNOSIS — I82401 Acute embolism and thrombosis of unspecified deep veins of right lower extremity: Secondary | ICD-10-CM

## 2019-03-06 DIAGNOSIS — Z79899 Other long term (current) drug therapy: Secondary | ICD-10-CM | POA: Diagnosis not present

## 2019-03-06 DIAGNOSIS — Z85830 Personal history of malignant neoplasm of bone: Secondary | ICD-10-CM | POA: Diagnosis not present

## 2019-03-06 DIAGNOSIS — R0602 Shortness of breath: Secondary | ICD-10-CM

## 2019-03-06 DIAGNOSIS — Z87891 Personal history of nicotine dependence: Secondary | ICD-10-CM | POA: Insufficient documentation

## 2019-03-06 DIAGNOSIS — C155 Malignant neoplasm of lower third of esophagus: Secondary | ICD-10-CM

## 2019-03-06 DIAGNOSIS — J439 Emphysema, unspecified: Secondary | ICD-10-CM | POA: Diagnosis not present

## 2019-03-06 DIAGNOSIS — C3490 Malignant neoplasm of unspecified part of unspecified bronchus or lung: Secondary | ICD-10-CM

## 2019-03-06 DIAGNOSIS — R05 Cough: Secondary | ICD-10-CM

## 2019-03-06 DIAGNOSIS — C774 Secondary and unspecified malignant neoplasm of inguinal and lower limb lymph nodes: Secondary | ICD-10-CM | POA: Diagnosis not present

## 2019-03-06 DIAGNOSIS — C3491 Malignant neoplasm of unspecified part of right bronchus or lung: Secondary | ICD-10-CM | POA: Diagnosis not present

## 2019-03-06 DIAGNOSIS — R0600 Dyspnea, unspecified: Secondary | ICD-10-CM | POA: Insufficient documentation

## 2019-03-06 DIAGNOSIS — Z8501 Personal history of malignant neoplasm of esophagus: Secondary | ICD-10-CM | POA: Insufficient documentation

## 2019-03-06 DIAGNOSIS — I2699 Other pulmonary embolism without acute cor pulmonale: Secondary | ICD-10-CM

## 2019-03-06 DIAGNOSIS — I129 Hypertensive chronic kidney disease with stage 1 through stage 4 chronic kidney disease, or unspecified chronic kidney disease: Secondary | ICD-10-CM | POA: Insufficient documentation

## 2019-03-06 DIAGNOSIS — Z85118 Personal history of other malignant neoplasm of bronchus and lung: Secondary | ICD-10-CM | POA: Insufficient documentation

## 2019-03-06 DIAGNOSIS — R5383 Other fatigue: Secondary | ICD-10-CM | POA: Diagnosis not present

## 2019-03-06 LAB — CBC WITH DIFFERENTIAL/PLATELET
Abs Immature Granulocytes: 0.03 10*3/uL (ref 0.00–0.07)
Basophils Absolute: 0 10*3/uL (ref 0.0–0.1)
Basophils Relative: 1 %
Eosinophils Absolute: 0.1 10*3/uL (ref 0.0–0.5)
Eosinophils Relative: 2 %
HCT: 33.5 % — ABNORMAL LOW (ref 36.0–46.0)
Hemoglobin: 10.6 g/dL — ABNORMAL LOW (ref 12.0–15.0)
Immature Granulocytes: 1 %
Lymphocytes Relative: 8 %
Lymphs Abs: 0.3 10*3/uL — ABNORMAL LOW (ref 0.7–4.0)
MCH: 27 pg (ref 26.0–34.0)
MCHC: 31.6 g/dL (ref 30.0–36.0)
MCV: 85.5 fL (ref 80.0–100.0)
Monocytes Absolute: 0.3 10*3/uL (ref 0.1–1.0)
Monocytes Relative: 9 %
Neutro Abs: 2.6 10*3/uL (ref 1.7–7.7)
Neutrophils Relative %: 79 %
Platelets: 183 10*3/uL (ref 150–400)
RBC: 3.92 MIL/uL (ref 3.87–5.11)
RDW: 16.8 % — ABNORMAL HIGH (ref 11.5–15.5)
WBC: 3.2 10*3/uL — ABNORMAL LOW (ref 4.0–10.5)
nRBC: 0 % (ref 0.0–0.2)

## 2019-03-06 LAB — CMP (CANCER CENTER ONLY)
ALT: 13 U/L (ref 0–44)
AST: 25 U/L (ref 15–41)
Albumin: 2.9 g/dL — ABNORMAL LOW (ref 3.5–5.0)
Alkaline Phosphatase: 59 U/L (ref 38–126)
Anion gap: 10 (ref 5–15)
BUN: 16 mg/dL (ref 8–23)
CO2: 23 mmol/L (ref 22–32)
Calcium: 8.9 mg/dL (ref 8.9–10.3)
Chloride: 104 mmol/L (ref 98–111)
Creatinine: 1.14 mg/dL — ABNORMAL HIGH (ref 0.44–1.00)
GFR, Est AFR Am: 53 mL/min — ABNORMAL LOW (ref >60)
GFR, Estimated: 46 mL/min — ABNORMAL LOW (ref >60)
Glucose, Bld: 129 mg/dL — ABNORMAL HIGH (ref 70–99)
Potassium: 3.5 mmol/L (ref 3.5–5.1)
Sodium: 137 mmol/L (ref 135–145)
Total Bilirubin: 0.3 mg/dL (ref 0.3–1.2)
Total Protein: 6.7 g/dL (ref 6.5–8.1)

## 2019-03-06 LAB — MAGNESIUM: Magnesium: 1.5 mg/dL — ABNORMAL LOW (ref 1.7–2.4)

## 2019-03-06 LAB — TROPONIN I: Troponin I: <0.03 ng/mL (ref <0.03)

## 2019-03-06 NOTE — Progress Notes (Signed)
Per Dr. Grier Mitts direction - transported patient to [facility] ED by w/c to room 12. Report given to Lum Babe, RN. Patient able to get up from w/c and sit on side of ED bed, A&Ox3,wearing mask

## 2019-03-06 NOTE — Telephone Encounter (Signed)
No los per 4/1.

## 2019-03-06 NOTE — ED Triage Notes (Signed)
Georgina Pillion., RN/Cancer Center arrived with c/o SOB and patient states she has had a fever for the past week.

## 2019-03-06 NOTE — ED Notes (Signed)
Bed: RF54 Expected date:  Expected time:  Means of arrival:  Comments: Cancer/Covid r/o

## 2019-03-06 NOTE — ED Triage Notes (Signed)
Patient was brought over from the cancer center by [facility] Staff. Patient refused to put gown on. Patient states she does not want anything else done but the swab for the flu. Patient states, I had a fever 8 days ago. And when asked about increased SOB, patient replied. "I am not anymore SOB than when I was when I first found out about my cancer." Encouraged patient to put a gown on, but refused.

## 2019-03-06 NOTE — ED Provider Notes (Addendum)
[facility] DEPT Provider Note   CSN: [id number] Arrival date & time: 03/06/19  1353    History   Chief Complaint Chief Complaint  Patient presents with  . Shortness of Breath    HPI Cindy Byrd is a 78 y.o. female.     HPI Patient presents for evaluation of her shortness of breath.  Has had over the last couple weeks.  Was seen in the ER around 8 days ago.  Had CT angiography at that time did not show clot.  Does have a history of lung cancer currently under treatment.  History of emphysema also.  Had been seen by oncology.  Routine notes they had requested to get COVID-19 testing but was unable to get it done.  Patient states she is just here for this testing and that is all that she wants.  States she needs it so she can go back to chemotherapy.  She has had a cough with no real sputum production.  States she is always short of breath since the lung cancer is no different.  Patient states she has had no fever at all.  There was question of a fever 8 days ago.  No nausea or vomiting.  No chest pain.  No swelling in her legs.  Reviewing notes oncology recommended cardiology work-up and more work-up for the dyspnea. Past Medical History:  Diagnosis Date  . Barrett's esophagus   . Bone neoplasm 06/24/2015  . Cancer [facility])    metastatic poorly differentiated carcinoma. tumor left groin surgical removal with radiation tx.  . Cataract    BILATERAL  . Cigarette smoker two packs a day or less    Currently still smoking 2 PPD - Not interested in quitting at this time.  . Colitis 2017  . Colon polyps    hyperplastic, tubular adenomas, tubulovillous adenoma  . Cough, persistent    hx. lung cancer ? primary-being evaluated, unsure of primary site.  . Depression 06/24/2015  . Diverticulosis   . Emphysema of lung ([place])   . Endometriosis    Hysterectomy with BSO at age 32 yrs  . Esophageal adenocarcinoma ([place]) 08/11/15   intramucosal  . Gastritis   . GERD  (gastroesophageal reflux disease)   . H/O: pneumonia   . Hiatal hernia   . Hyperlipidemia   . Hypertension 06/24/2015   likely improved incidental to 40 lbs weight loss from her neoplasm. No Longer taking med for this as of 08-06-15  . IBS (irritable bowel syndrome)   . Pain    left hip-persistent"tumor of bone"-radiation tx. 10.  . Vitamin D deficiency disease     Patient Active Problem List   Diagnosis Date Noted  . Malignant neoplasm of distal third of esophagus ([place]) 01/14/2019  . Counseling regarding advance care planning and goals of care 01/14/2019  . Abnormal finding on imaging   . Esophageal mass   . GAD (generalized anxiety disorder) 10/24/2018  . Hyperlipidemia 04/23/2018  . Chronic kidney disease 04/23/2018  . Elevated TSH 04/17/2018  . Cough 02/21/2018  . Elevated serum creatinine 02/21/2018  . Health care maintenance 01/15/2018  . Malignant neoplasm of lung ([place])   . Colitis 06/23/2017  . Left lower quadrant pain 06/23/2017  . Fracture of left iliac crest ([place]) 06/23/2017  . Metastatic lung cancer (metastasis from lung to other site), unspecified laterality ([place])   . Hypocalcemia 09/08/2016  . Vitamin D deficiency 09/08/2016  . New onset a-fib ([place]) 09/08/2016  . Chest pain   .  Thrush of mouth and esophagus ([place])   . Protein-calorie malnutrition, severe ([place])   . Colitis determined by colorectal biopsy   . Bilateral pulmonary embolism ([place]) 09/07/2016  . DVT of lower extremity, bilateral ([place]) 09/07/2016  . Palliative care by specialist   . Goals of care, counseling/discussion   . Advance care planning   . Diarrhea   . Ulcerative pancolitis without complication ([place])   . Cancer ([place])   . Pressure injury of skin 09/02/2016  . HCAP (healthcare-associated pneumonia) 09/01/2016  . ARF (acute renal failure) ([place]) 09/01/2016  . Nausea with vomiting 07/18/2016  . Dehydration 07/18/2016  . Hypokalemia 07/18/2016  . Hypoalbuminemia due to protein-calorie  malnutrition ([place]) 07/18/2016  . Peripheral edema 07/18/2016  . Diarrhea due to drug 05/19/2016  . Port catheter in place 04/07/2016  . Nicotine addiction 01/19/2016  . Barrett's esophagus determined by biopsy 09/04/2015  . Gastritis   . Primary malignant neoplasm of lung metastatic to other site ([place]) 08/06/2015  . Esophageal reflux 08/04/2015  . Bone metastasis ([place]) 06/24/2015  . Neoplasm related pain 06/24/2015  . Protein calorie malnutrition ([place]) 06/24/2015  . Anorexia 06/24/2015  . Heavy smoker (more than 20 cigarettes per day) 06/24/2015  . Depression 06/24/2015  . HTN (hypertension) 06/24/2015    Past Surgical History:  Procedure Laterality Date  . BARTHOLIN GLAND CYST EXCISION  78 yo ago   Does not want if it was an infected cyst or tumor. Was soon as delivery  . BIOPSY  01/02/2019   Procedure: BIOPSY;  Surgeon: [name], MD;  Location: [facility];  Service: Gastroenterology;;  . CATARACT EXTRACTION    . COLONOSCOPY W/ POLYPECTOMY     multiple times - last done 09/2014 per patient.  . ESOPHAGOGASTRODUODENOSCOPY (EGD) WITH PROPOFOL N/A 08/11/2015   Procedure: ESOPHAGOGASTRODUODENOSCOPY (EGD) WITH PROPOFOL;  Surgeon: [name], MD;  Location: [facility];  Service: Gastroenterology;  Laterality: N/A;  . ESOPHAGOGASTRODUODENOSCOPY (EGD) WITH PROPOFOL N/A 01/02/2019   Procedure: ESOPHAGOGASTRODUODENOSCOPY (EGD) WITH PROPOFOL;  Surgeon: [name], MD;  Location: [facility];  Service: Gastroenterology;  Laterality: N/A;  . FLEXIBLE SIGMOIDOSCOPY N/A 06/24/2017   Procedure: FLEXIBLE SIGMOIDOSCOPY;  Surgeon: Manus Gunning, MD;  Location: [facility];  Service: Gastroenterology;  Laterality: N/A;  . GANGLION CYST EXCISION    . KNEE ARTHROSCOPY  age about 33 yrs  . TONSILLECTOMY    . TOTAL ABDOMINAL HYSTERECTOMY W/ BILATERAL SALPINGOOPHORECTOMY  at age 69 yrs   For endometriosis     OB History   No obstetric history on file.      Home Medications     Prior to Admission medications   Medication Sig Start Date End Date Taking? Authorizing Provider  albuterol (PROVENTIL HFA;VENTOLIN HFA) 108 (90 Base) MCG/ACT inhaler Inhale 1 puff into the lungs every 6 (six) hours as needed for wheezing or shortness of breath. 02/27/19   Danford, Valetta Fuller D, NP  amitriptyline (ELAVIL) 25 MG tablet Take 25 mg by mouth at bedtime.  01/15/19   [provider]  amLODipine (NORVASC) 10 MG tablet TAKE 1 TABLET BY MOUTH EVERY DAY 03/06/19   Opalski, Neoma Laming, DO  Cholecalciferol (VITAMIN D3) 25 MCG (1000 UT) CAPS Take 1,000 Units by mouth daily.    [provider]  dexamethasone (DECADRON) 4 MG tablet Take 2 tablets (8 mg total) by mouth daily. Start the day after chemotherapy for 2 days. 01/14/19   [name], MD  FLUoxetine (PROZAC) 20 MG capsule Take 1 capsule (20  mg total) by mouth daily. 12/19/18   Mina Marble D, NP  lidocaine-prilocaine (EMLA) cream Apply to affected area once 01/14/19   [name], MD  LORazepam (ATIVAN) 0.5 MG tablet Take 1 tablet (0.5 mg total) by mouth every 6 (six) hours as needed (Nausea or vomiting). 01/14/19   [name], MD  naproxen sodium (ALEVE) 220 MG tablet Take 440 mg by mouth daily as needed (pain).    [provider]  omeprazole (PRILOSEC) 40 MG capsule TAKE 1 CAPSULE BY MOUTH EVERY DAY Patient taking differently: Take 40 mg by mouth 2 (two) times daily.  01/15/18   Danford, Valetta Fuller D, NP  ondansetron (ZOFRAN) 8 MG tablet Take 1 tablet (8 mg total) by mouth 2 (two) times daily as needed for refractory nausea / vomiting. Start on day 3 after chemo. 01/14/19   [name], MD  Probiotic Product (PROBIOTIC PO) Take 1 capsule by mouth 2 (two) times daily.    [provider]  prochlorperazine (COMPAZINE) 10 MG tablet Take 1 tablet (10 mg total) by mouth every 6 (six) hours as needed (Nausea or vomiting). 01/14/19   [name], MD  sucralfate (CARAFATE) 1 g tablet  Take 1 tablet (1 g total) by mouth 4 (four) times daily. 02/15/19   Kyung Rudd, MD  traZODone (DESYREL) 50 MG tablet TAKE 1 TABLET BY MOUTH EVERYDAY AT BEDTIME Patient taking differently: Take 50 mg by mouth at bedtime.  07/04/18   [name], MD  Vitamin D, Ergocalciferol, (DRISDOL) 1.25 MG (50000 UT) CAPS capsule Take 50,000 Units by mouth every 7 (seven) days.  01/05/19   [provider]  XARELTO 20 MG TABS tablet TAKE 1 TABLET DAILY WITH SUPPER. DC LOVENOX AND START XARELTO AT THE SCHEDULED TIME AS INSTRUCTED. Patient taking differently: Take 20 mg by mouth daily with supper.  11/13/18   [name], MD    Family History Family History  Problem Relation Age of Onset  . Colon cancer Brother   . Colon cancer Brother   . Stroke Mother   . Colon cancer Father   . Breast cancer Daughter 68       ER/PR+ stage II    Social History Social History   Tobacco Use  . Smoking status: Former Smoker    Packs/day: 1.00    Years: 60.00    Pack years: 60.00    Types: Cigarettes    Last attempt to quit: 12/06/2015    Years since quitting: 3.2  . Smokeless tobacco: Never Used  Substance Use Topics  . Alcohol use: No    Alcohol/week: 0.0 standard drinks  . Drug use: No     Allergies   Penicillins; Remeron [mirtazapine]; and Latex   Review of Systems Review of Systems  Constitutional: Positive for fatigue. Negative for activity change and fever.  HENT: Negative for congestion.   Respiratory: Positive for cough and shortness of breath.   Cardiovascular: Negative for chest pain.  Gastrointestinal: Negative for abdominal pain.  Genitourinary: Negative for flank pain.  Musculoskeletal: Negative for back pain.  Skin: Negative for rash.  Neurological: Negative for weakness.     Physical Exam Updated Vital Signs BP (!) 108/55 (BP Location: Left Arm)   Pulse 88   Temp 98.1 F (36.7 C) (Oral)   Resp 16   Ht 5' 8.5" (1.74 m)   Wt 65.3 kg   SpO2 96%   BMI  21.58 kg/m   Physical Exam Vitals signs and  nursing note reviewed.  Neck:     Musculoskeletal: Neck supple.  Cardiovascular:     Rate and Rhythm: Normal rate and regular rhythm.  Pulmonary:     Breath sounds: No wheezing, rhonchi or rales.  Chest:     Chest wall: No tenderness.  Abdominal:     Tenderness: There is no abdominal tenderness.  Musculoskeletal:     Right lower leg: No edema.     Left lower leg: No edema.  Skin:    General: Skin is warm.     Capillary Refill: Capillary refill takes less than 2 seconds.  Neurological:     General: No focal deficit present.     Mental Status: She is alert.      ED Treatments / Results  Labs (all labs ordered are listed, but only abnormal results are displayed) Labs Reviewed  NOVEL CORONAVIRUS, NAA (HOSPITAL ORDER, SEND-OUT TO REF LAB)  TROPONIN I    EKG EKG Interpretation  Date/Time:  Wednesday March 06 2019 14:58:14 EDT Ventricular Rate:  79 PR Interval:    QRS Duration: 85 QT Interval:  391 QTC Calculation: 449 R Axis:   45 Text Interpretation:  Sinus or ectopic atrial rhythm Low voltage, precordial leads QRS amplitude decreased. Confirmed by Davonna Belling [phone]) on 03/06/2019 3:14:43 PM   Radiology Dg Chest Port 1 View  Result Date: 03/06/2019 CLINICAL DATA:  History of shortness of breath and fever. EXAM: PORTABLE CHEST 1 VIEW COMPARISON:  Chest radiograph 02/25/2019. FINDINGS: Normal heart size. Clear lung fields. No bony abnormality. Unchanged Port-A-Cath. Calcified tortuous aorta. IMPRESSION: Stable chest.  No active disease. Electronically Signed   By: Staci Righter M.D.   On: 03/06/2019 15:06    Procedures Procedures (including critical care time)  Medications Ordered in ED Medications - No data to display   Initial Impression / Assessment and Plan / ED Course  I have reviewed the triage vital signs and the nursing notes.  Pertinent labs & imaging results that were available during my care of the  patient were reviewed by me and considered in my medical decision making (see chart for details).        Patient with dyspnea over the last couple weeks.  Reportedly came in for Covid testing.  Reviewed labs from cancer center.  Appears to be at baseline.  X-ray reassuring.  EKG reassuring but does show mildly decreased QRS amplitude.  Troponin pending.  Covid testing done but results not available immediately.  Will discharge home for outpatient follow-up.  SKYLENE DEREMER was evaluated in Emergency Department on 03/06/2019 for the symptoms described in the history of present illness. She was evaluated in the context of the global COVID-19 pandemic, which necessitated consideration that the patient might be at risk for infection with the SARS-CoV-2 virus that causes COVID-19. Institutional protocols and algorithms that pertain to the evaluation of patients at risk for COVID-19 are in a state of rapid change based on information released by regulatory bodies including the CDC and federal and state organizations. These policies and algorithms were followed during the patient's care in the ED.   X-ray back and reassuring.  EKG does show decreased QRS amplitude.  However same heart size on x-ray making pericardial effusion less likely.  CT scan done just over a week ago did not show pericardial effusion.  Did get Covid  testing that can be followed as an outpatient.  Will need to isolate and probably not go back to oncology/chemotherapy until it is negative.  Discharge home.  Final Clinical Impressions(s) / ED Diagnoses   Final diagnoses:  Dyspnea, unspecified type    ED Discharge Orders    None       Davonna Belling, MD 03/06/19 [place], Delane Stalling, MD 03/06/19 [phone]

## 2019-03-06 NOTE — Discharge Instructions (Addendum)
With your shortness of breath and concern for Covid-19 should remain isolated until you have a negative test.

## 2019-03-07 ENCOUNTER — Ambulatory Visit: Payer: Medicare Other

## 2019-03-07 ENCOUNTER — Telehealth: Payer: Self-pay | Admitting: Radiation Oncology

## 2019-03-07 DIAGNOSIS — Z510 Encounter for antineoplastic radiation therapy: Secondary | ICD-10-CM | POA: Insufficient documentation

## 2019-03-07 DIAGNOSIS — C155 Malignant neoplasm of lower third of esophagus: Secondary | ICD-10-CM | POA: Diagnosis not present

## 2019-03-07 NOTE — Telephone Encounter (Signed)
The patient was discussed last week. She was advised to go to the ED for evaluation last week to consider evaluation for falls and possible symptoms of covid 19. She declined and after discussing her case with Dr. Tammi Klippel, we were to treat her with a mask on, and insist on testing if her symptoms progressed. Of note her last elevated temp was 99.1 in the ED on 02/25/2019, but when rechecked was normal. The patient denies any fevers. Yesterday after seeing medical oncology, she was sent to the ED for testing. This was completed. After much discussion with Dr. Tammi Klippel, Dr. Lisbeth Renshaw, and Dr. Marcelyn Ditty, we will hold off on treatment for her until her results are available, then make decisions based on these results. She is contacted and is aware of the plans and to continue to self quarantine. The staff is also now aware of the concerns. Apparently she did not wear a mask on Tuesday of this week, and understandably the treatment staff is concerned. We will continue to document the goings on as we move forward.

## 2019-03-08 ENCOUNTER — Ambulatory Visit: Admission: RE | Admit: 2019-03-08 | Payer: Medicare Other | Source: Ambulatory Visit

## 2019-03-09 LAB — NOVEL CORONAVIRUS, NAA (HOSP ORDER, SEND-OUT TO REF LAB; TAT 18-24 HRS): SARS-CoV-2, NAA: NOT DETECTED

## 2019-03-11 ENCOUNTER — Other Ambulatory Visit: Payer: Self-pay | Admitting: Hematology

## 2019-03-11 ENCOUNTER — Telehealth: Payer: Self-pay | Admitting: Radiation Oncology

## 2019-03-11 ENCOUNTER — Ambulatory Visit
Admission: RE | Admit: 2019-03-11 | Discharge: 2019-03-11 | Disposition: A | Discharge status: Home / Self Care | Payer: Medicare Other | Source: Ambulatory Visit | Attending: Radiation Oncology | Admitting: Radiation Oncology

## 2019-03-11 ENCOUNTER — Other Ambulatory Visit: Payer: Self-pay

## 2019-03-11 DIAGNOSIS — C155 Malignant neoplasm of lower third of esophagus: Secondary | ICD-10-CM | POA: Diagnosis not present

## 2019-03-11 DIAGNOSIS — Z510 Encounter for antineoplastic radiation therapy: Secondary | ICD-10-CM | POA: Diagnosis not present

## 2019-03-11 DIAGNOSIS — R0602 Shortness of breath: Secondary | ICD-10-CM

## 2019-03-11 NOTE — Telephone Encounter (Signed)
I called the patient following the results this weekend of her covid testing being negative. I left her a message letting her know we could proceed today. She does not have to mask as she was asked previously, but is certainly welcome to if she desires and has as mask. I'm not sure what the front desk will have as far as PPE for pts who screen negative.

## 2019-03-12 ENCOUNTER — Ambulatory Visit
Admission: RE | Admit: 2019-03-12 | Discharge: 2019-03-12 | Disposition: A | Discharge status: Home / Self Care | Payer: Medicare Other | Source: Ambulatory Visit | Attending: Radiation Oncology | Admitting: Radiation Oncology

## 2019-03-12 ENCOUNTER — Other Ambulatory Visit: Payer: Self-pay

## 2019-03-12 DIAGNOSIS — Z510 Encounter for antineoplastic radiation therapy: Secondary | ICD-10-CM | POA: Diagnosis not present

## 2019-03-12 DIAGNOSIS — C155 Malignant neoplasm of lower third of esophagus: Secondary | ICD-10-CM | POA: Diagnosis not present

## 2019-03-12 NOTE — Progress Notes (Signed)
Cindy Byrd    HEMATOLOGY ONCOLOGY PROGRESS NOTE  Date of service:  03/13/19    Patient Care Team: [name], NP as PCP - General (Family Medicine) [name], MD as Consulting Physician (Hematology and Oncology)  CHIEF COMPLAINTS/PURPOSE OF CONSULTATION:  F/u for metastatic lung cancer  DIAGNOSIS:   #1 Metastatic non-small cell lung cancer with bilateral lung nodules and large metastatic lesion in the left Ilium. #2 Barrett's esophagus with some evidence of intramucosal adenocarcinoma of the esophagus. 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 Also had c diff colitis - treated   Current Treatment  1) Active surveillance 2) Xgeva 159m [place] q4weeks for bone metastases. 3) Sandostatin q4weeks for diarrhea - immune colitis  Previous Treatment  1 Palliative radiation therapy to the large left ilium metastases 2. IV Nivolumab x 20ycles (discontinued due to likely immune colitis) 3. Xgeva 1248mSC q4weeks for bone metastases.   HISTORY OF PRESENTING ILLNESS: (plz see my previous consultation for details of initial presentation)  INTERVAL HISTORY:   Cindy Byrd here for her scheduled follow-up for metastatic lung cancer, and newly diagnosed adenocarcinoma of the esophagus. The patient's last visit with [facility] on 02/12/19. The pt reports that she is doing well overall. The patient's last visit with [facility] on 03/06/19. The pt reports that she is doing well overall.   The pt reports that when she stands up and begins to walk a couple steps she becomes overtly SOB and light headed. She denies having "any pain anywhere." She feels that her SOB upon standing has been getting worse. She notes that it takes her a few minutes to catch her breath again. She notes that she has used an inhaler which does not help at all. She notes that she continues "coughing some." Importantly, her Covid19 test came back as  negative in the interim. She denies any fevers. She denies leg swelling.   The pt is taking Amlodipine for her BP, and notes that she is not eating as much. She denies any concerns for bleeding. She denies having any diarrhea.  Lab results today (03/13/19) of CBC w/diff and CMP is as follows: all values are WNL except for WBC at 3.3k, RBC at 3.83, HGB at 10.3, HCT at 32.6, RDW at 17.7, Lymphs abs at 400, Potassium at 3.3, Glucose at 149, Creatinine at 1.19, Calcium at 8.1, Albumin at 2.7, GFR at 43. 03/13/19 Magnesium at 1.5 03/06/19 SARS-CoV-2 was Negative.  On review of systems, pt reports SOB upon standing and walking, light headed when walking, eating less, and denies fevers, pain anywhere, abdominal pain, problems swallowing, pain while swallowing, leg swelling, concern for bleeding, diarrhea, and any other symptoms.   MEDICAL HISTORY:  Past Medical History:  Diagnosis Date  . Barrett's esophagus   . Bone neoplasm 06/24/2015  [facility]   metastatic poorly differentiated carcinoma. tumor left groin surgical removal with radiation tx.  . Cataract    BILATERAL  . Cigarette smoker two packs a day or less    Currently still smoking 2 PPD - Not interested in quitting at this time.  . Colitis 2017  . Colon polyps    hyperplastic, tubular adenomas, tubulovillous adenoma  . Cough, persistent    hx. lung cancer ? primary-being evaluated, unsure of primary site.  . Depression 06/24/2015  . Diverticulosis   . Emphysema of lung ([facility]  . Endometriosis  Hysterectomy with BSO at age 78 yrs  . Esophageal adenocarcinoma ([place]) 08/11/15   intramucosal  . Gastritis   . GERD (gastroesophageal reflux disease)   . H/O: pneumonia   . Hiatal hernia   . Hyperlipidemia   . Hypertension 06/24/2015   likely improved incidental to 40 lbs weight loss from her neoplasm. No Longer taking med for this as of 08-06-15  . IBS (irritable bowel syndrome)   . Pain    left hip-persistent"tumor of bone"-radiation  tx. 10.  . Vitamin D deficiency disease    SURGICAL HISTORY: Past Surgical History:  Procedure Laterality Date  . BARTHOLIN GLAND CYST EXCISION  78 yo ago   Does not want if it was an infected cyst or tumor. Was soon as delivery  . BIOPSY  01/02/2019   Procedure: BIOPSY;  Surgeon: [name], MD;  Location: [facility];  Service: Gastroenterology;;  . CATARACT EXTRACTION    . COLONOSCOPY W/ POLYPECTOMY     multiple times - last done 09/2014 per patient.  . ESOPHAGOGASTRODUODENOSCOPY (EGD) WITH PROPOFOL N/A 08/11/2015   Procedure: ESOPHAGOGASTRODUODENOSCOPY (EGD) WITH PROPOFOL;  Surgeon: [name], MD;  Location: [facility];  Service: Gastroenterology;  Laterality: N/A;  . ESOPHAGOGASTRODUODENOSCOPY (EGD) WITH PROPOFOL N/A 01/02/2019   Procedure: ESOPHAGOGASTRODUODENOSCOPY (EGD) WITH PROPOFOL;  Surgeon: [name], MD;  Location: [facility];  Service: Gastroenterology;  Laterality: N/A;  . FLEXIBLE SIGMOIDOSCOPY N/A 06/24/2017   Procedure: FLEXIBLE SIGMOIDOSCOPY;  Surgeon: Manus Gunning, MD;  Location: [facility];  Service: Gastroenterology;  Laterality: N/A;  . GANGLION CYST EXCISION    . KNEE ARTHROSCOPY  age about 12 yrs  . TONSILLECTOMY    . TOTAL ABDOMINAL HYSTERECTOMY W/ BILATERAL SALPINGOOPHORECTOMY  at age 89 yrs   For endometriosis    SOCIAL HISTORY: Social History   Socioeconomic History  . Marital status: Widowed    Spouse name: Not on file  . Number of children: 2  . Years of education: Not on file  . Highest education level: Not on file  Occupational History  . Not on file  Social Needs  . Financial resource strain: Not on file  . Food insecurity:    Worry: Not on file    Inability: Not on file  . Transportation needs:    Medical: No    Non-medical: No  Tobacco Use  . Smoking status: Former Smoker    Packs/day: 1.00    Years: 60.00    Pack years: 60.00    Types: Cigarettes    Last attempt to quit: 12/06/2015    Years since quitting:  3.2  . Smokeless tobacco: Never Used  Substance and Sexual Activity  . Alcohol use: No    Alcohol/week: 0.0 standard drinks  . Drug use: No  . Sexual activity: Never  Lifestyle  . Physical activity:    Days per week: Not on file    Minutes per session: Not on file  . Stress: Not on file  Relationships  . Social connections:    Talks on phone: Not on file    Gets together: Not on file    Attends religious service: Not on file    Active member of club or organization: Not on file    Attends meetings of clubs or organizations: Not on file    Relationship status: Not on file  . Intimate partner violence:    Fear of current or ex partner: Not on file    Emotionally abused: Not on file  Physically abused: Not on file    Forced sexual activity: Not on file  Other Topics Concern  . Not on file  Social History Narrative  . Not on file    FAMILY HISTORY: Family History  Problem Relation Age of Onset  . Colon cancer Brother   . Colon cancer Brother   . Stroke Mother   . Colon cancer Father   . Breast cancer Daughter 41       ER/PR+ stage II    ALLERGIES:  is allergic to penicillins; remeron [mirtazapine]; and latex. patient wonders if she has a penicillin allergy but notes that she is uncertain about this.  MEDICATIONS:  Current Outpatient Medications  Medication Sig Dispense Refill  . albuterol (PROVENTIL HFA;VENTOLIN HFA) 108 (90 Base) MCG/ACT inhaler Inhale 1 puff into the lungs every 6 (six) hours as needed for wheezing or shortness of breath. 1 Inhaler 1  . amitriptyline (ELAVIL) 25 MG tablet Take 25 mg by mouth at bedtime.     Cindy Byrd amLODipine (NORVASC) 10 MG tablet TAKE 1 TABLET BY MOUTH EVERY DAY 90 tablet 0  . Cholecalciferol (VITAMIN D3) 25 MCG (1000 UT) CAPS Take 1,000 Units by mouth daily.    Cindy Byrd dexamethasone (DECADRON) 4 MG tablet Take 2 tablets (8 mg total) by mouth daily. Start the day after chemotherapy for 2 days. 30 tablet 1  . FLUoxetine (PROZAC) 20 MG capsule  Take 1 capsule (20 mg total) by mouth daily. 90 capsule 3  . lidocaine-prilocaine (EMLA) cream Apply to affected area once 30 g 3  . LORazepam (ATIVAN) 0.5 MG tablet Take 1 tablet (0.5 mg total) by mouth every 6 (six) hours as needed (Nausea or vomiting). 30 tablet 0  . naproxen sodium (ALEVE) 220 MG tablet Take 440 mg by mouth daily as needed (pain).    Cindy Byrd omeprazole (PRILOSEC) 40 MG capsule TAKE 1 CAPSULE BY MOUTH EVERY DAY (Patient taking differently: Take 40 mg by mouth 2 (two) times daily. ) 90 capsule 2  . ondansetron (ZOFRAN) 8 MG tablet Take 1 tablet (8 mg total) by mouth 2 (two) times daily as needed for refractory nausea / vomiting. Start on day 3 after chemo. 30 tablet 1  . Probiotic Product (PROBIOTIC PO) Take 1 capsule by mouth 2 (two) times daily.    . prochlorperazine (COMPAZINE) 10 MG tablet Take 1 tablet (10 mg total) by mouth every 6 (six) hours as needed (Nausea or vomiting). 30 tablet 1  . sucralfate (CARAFATE) 1 g tablet Take 1 tablet (1 g total) by mouth 4 (four) times daily. 120 tablet 2  . traZODone (DESYREL) 50 MG tablet TAKE 1 TABLET BY MOUTH EVERYDAY AT BEDTIME (Patient taking differently: Take 50 mg by mouth at bedtime. ) 90 tablet 2  . Vitamin D, Ergocalciferol, (DRISDOL) 1.25 MG (50000 UT) CAPS capsule Take 50,000 Units by mouth every 7 (seven) days.     Cindy Byrd VITAMIN E PO Take 1 tablet by mouth daily.    Alveda Reasons 20 MG TABS tablet TAKE 1 TABLET DAILY WITH SUPPER. DC LOVENOX AND START XARELTO AT THE SCHEDULED TIME AS INSTRUCTED. (Patient taking differently: Take 20 mg by mouth daily with supper. ) 90 tablet 1   No current facility-administered medications for this visit.     REVIEW OF SYSTEMS:    A 10+ POINT REVIEW OF SYSTEMS WAS OBTAINED including neurology, dermatology, psychiatry, cardiac, respiratory, lymph, extremities, GI, GU, Musculoskeletal, constitutional, breasts, reproductive, HEENT.  All pertinent positives are noted in the HPI.  All others are negative.    PHYSICAL EXAMINATION: ECOG PERFORMANCE STATUS: 2 - Symptomatic, <50% confined to bed  Vitals:   03/13/19 0845  BP: (!) 109/56  Pulse: (!) 106  Resp: 18  Temp: 98.3 F (36.8 C)  SpO2: 98%   Filed Weights   03/13/19 0845  Weight: 141 lb 14.4 oz (64.4 kg)  . Cindy Byrd Wt Readings from Last 3 Encounters:  03/13/19 141 lb 14.4 oz (64.4 kg)  03/06/19 144 lb (65.3 kg)  03/06/19 145 lb 11.2 oz (66.1 kg)   GENERAL:alert, in no acute distress and comfortable SKIN: no acute rashes, no significant lesions EYES: conjunctiva are pink and non-injected, sclera anicteric OROPHARYNX: MMM, no exudates, no oropharyngeal erythema or ulceration NECK: supple, no JVD LYMPH:  no palpable lymphadenopathy in the cervical, axillary or inguinal regions LUNGS: clear to ascultation HEART: regular rate & rhythm ABDOMEN:  normoactive bowel sounds , non tender, not distended. No palpable hepatosplenomegaly.  Extremity: no pedal edema PSYCH: alert & oriented x 3 with fluent speech NEURO: no focal motor/sensory deficits   LABORATORY DATA:  I have reviewed the data as listed  . CBC Latest Ref Rng & Units 03/13/2019 03/06/2019 02/25/2019  WBC 4.0 - 10.5 K/uL 3.3(L) 3.2(L) 2.1(L)  Hemoglobin 12.0 - 15.0 g/dL 10.3(L) 10.6(L) 10.4(L)  Hematocrit 36.0 - 46.0 % 32.6(L) 33.5(L) 33.8(L)  Platelets 150 - 400 K/uL 150 183 120(L)    . CMP Latest Ref Rng & Units 03/13/2019 03/06/2019 02/25/2019  Glucose 70 - 99 mg/dL 149(H) 129(H) 111(H)  BUN 8 - 23 mg/dL _0 Creatinine 0.44 - 1.00 mg/dL 1.19(H) 1.14(H) 0.90  Sodium 135 - 145 mmol/L 139 137 135  Potassium 3.5 - 5.1 mmol/L 3.3(L) 3.5 3.9  Chloride 98 - 111 mmol/L 107 104 104  CO2 22 - 32 mmol/L _1 Calcium 8.9 - 10.3 mg/dL 8.1(L) 8.9 8.0(L)  Total Protein 6.5 - 8.1 g/dL 6.5 6.7 6.4(L)  Total Bilirubin 0.3 - 1.2 mg/dL 0.4 0.3 0.6  Alkaline Phos 38 - 126 U/L 64 59 52  AST 15 - 41 U/L 32 25 22  ALT 0 - 44 U/L _2 01/02/19 Esophagus Biopsy:     RADIOGRAPHIC STUDIES:  .Dg Chest 2 View  Result Date: 02/25/2019 CLINICAL DATA:  SOB x 2 days - hx pulmonary embolisms - currently being treated for esophageal cancer - current smoker EXAM: CHEST - 2 VIEW COMPARISON:  06/28/2018 FINDINGS: Coarse linear opacities in the lung bases left greater than right as before. No confluent airspace disease or overt edema. Heart size normal.  Aortic Atherosclerosis (ICD10-170.0). No pneumothorax. No pleural effusion. Right IJ port catheter to the distal SVC. Visualized bones unremarkable. IMPRESSION: No acute cardiopulmonary disease. Electronically Signed   By: Lucrezia Europe M.D.   On: 02/25/2019 15:16   Ct Angio Chest Pe W And/or Wo Contrast  Result Date: 02/25/2019 CLINICAL DATA:  Shortness of breath and fever.  Esophageal carcinoma EXAM: CT ANGIOGRAPHY CHEST WITH CONTRAST TECHNIQUE: Multidetector CT imaging of the chest was performed using the standard protocol during bolus administration of intravenous contrast. Multiplanar CT image reconstructions and MIPs were obtained to evaluate the vascular anatomy. CONTRAST:  144m OMNIPAQUE IOHEXOL 350 MG/ML SOLN COMPARISON:  PET-CT January 25, 2019; chest radiograph February 25, 2019 prior chest CT with contrast July 19, 2018 FINDINGS: Cardiovascular: No pulmonary embolus evident. Note that there is pulmonary vascular attenuation in the left lower lobe, also present  previously. There is no thoracic aortic aneurysm or dissection. There are foci of calcification in the proximal visualized great vessels. There are foci aortic atherosclerosis. There are foci of coronary artery calcification. No pericardial effusion or pericardial thickening is evident. Mediastinum/Nodes: Thyroid appears somewhat diminutive but unremarkable. There is no appreciable thoracic adenopathy. There is thickening in the wall of the distal esophagus at the site of known esophageal carcinoma. Lungs/Pleura: There is underlying centrilobular emphysematous  change. There are areas of lower lobe atelectatic change. No edema or consolidation. No pleural effusion or pleural thickening evident. There is a 4 mm nodular opacity in the lateral segment of the right lower lobe, also seen on recent studies. Upper Abdomen: There is aortic atherosclerosis in the upper abdomen region. Visualized upper abdominal structures otherwise appear unremarkable. Musculoskeletal: No blastic or lytic bone lesions evident. No appreciable chest wall lesions. Review of the MIP images confirms the above findings. IMPRESSION: 1. No demonstrable pulmonary embolus. No thoracic aortic aneurysm or dissection. There is aortic atherosclerosis. There are foci of great vessel and coronary artery calcification. 2. Thickening of the wall of the distal esophagus at the site of known esophageal carcinoma present. 3. Underlying centrilobular emphysematous change. Areas of atelectatic change. 4 mm nodular opacity right lower lobe, stable. A small metastasis cannot be excluded. 4.  No appreciable thoracic adenopathy. Aortic Atherosclerosis (ICD10-I70.0) and Emphysema (ICD10-J43.9). Electronically Signed   By: Lowella Grip III M.D.   On: 02/25/2019 18:25   Dg Chest Port 1 View  Result Date: 03/06/2019 CLINICAL DATA:  History of shortness of breath and fever. EXAM: PORTABLE CHEST 1 VIEW COMPARISON:  Chest radiograph 02/25/2019. FINDINGS: Normal heart size. Clear lung fields. No bony abnormality. Unchanged Port-A-Cath. Calcified tortuous aorta. IMPRESSION: Stable chest.  No active disease. Electronically Signed   By: Staci Righter M.D.   On: 03/06/2019 15:06    ASSESSMENT & PLAN:   78 y.o. female with  #1 Metastatic poorly differentiated carcinoma with likely lung primary non-small cell lung cancer.   CT of the head with and without contrast showed no evidence of metastatic disease. EGFR blood test mutation analysis negative. CT chest abdomen pelvis 04/19/2016 shows no evidence of disease  progression. Patient tolerated Nivolumab very well but was discontinued when she developed grade 2 Immune colitis. Has been off Nivolumab for >6 months  CT chest abdomen pelvis on 06/24/2016 shows no evidence of new disease or progression of metastatic disease. CT chest abdomen pelvis 09/06/2016 shows 1. Mixed interval response to therapy. 2. There is a new left ventral chest wall lesion deep to the pectoralis musculature worrisome for metastatic disease. 3. Posterior lower lobe nodular densities are identified which may reflect areas of pulmonary metastasis. 4. Interval decrease in size of destructive lesion involving the left iliac bone.  CT chest abd pelvis 12/08/2016: Cystic mass involving the left ventral chest wall has resolved in the interval. Likely was a hematoma due to trauma. Interval increase in size of pleural base mass overlying the posterior and inferior left lower lobe. There is also a new left pleural effusion identified.  CT chest 02/01/2017: Residual irregular soft tissue thickening/volume loss and trace left pleural fluid at the base of the left hemithorax, overall improved in appearance from 12/08/2016. No measurable lesion.  CT chest 05/29/2017 shows no residual pleural based mass or significant pleural effusion in the left hemithorax. No evidence of thoracic metastatic disease. No evidence of progressive metastatic disease within the abdomen or pelvis. Mixed lytic and blastic lesion involving  the left iliac bone and associated pathologic fracture are unchanged.   CT CAP 09/14/17 shows no new changes. She does have slight displacement of her fractured left iliac bone. Evidence of stable disease.   CT CAP 01/04/2018- No new or progressive metastatic disease. Stable large left iliac bone metastasis with associated chronic pathologic fracture.   CT chest/abd/pelvis done on 04/26/18 revealed Stable exam.  No new or progressive interval findings.  07/19/18 CT C/A/P revealed Stable exam.  No  new or progressive interval findings. Large destructive left iliac lesion is similar to prior. Aortic Atherosclerosis and Emphysema.    11/06/18 CT C/A/P revealed Similar appearance of large mixed lytic and sclerotic lesion in the left ilium. No new metastatic lesions are otherwise noted elsewhere in the chest, abdomen or pelvis. 2. Interval development of thickening of the distal third of the esophagus. This is nonspecific, and could be related to underlying reflux esophagitis. However, if there is any clinical concern for Barrett's metaplasia or esophageal neoplasia, further evaluation with nonemergent endoscopy could be considered. 3. Aortic atherosclerosis, in addition to left main coronary artery disease. Assessment for potential risk factor modification, dietary therapy or pharmacologic therapy may be warranted, if clinically Indicated. 4. Diffuse bronchial wall thickening with mild to moderate centrilobular and paraseptal emphysema; imaging findings suggestive of underlying COPD. 5. Additional incidental findings, as above.   #2 Newly diagnosed Adenocarcinoma of the esophagus  Barrett's esophagus 4cms in the distal esophagus with low and high-grade dysplasia  01/02/19 Surgical pathology revealed adenocarcinoma of the esophagus   01/25/19 PET/CT revealed Distal esophageal primary, without hypermetabolic metastatic disease. 2. Chronic left iliac metastasis, as before. 3. Hypermetabolism within and superficial to the right gluteal musculature is most likely related to trauma and/or injection sites. 4. Aortic atherosclerosis, coronary artery atherosclerosis and emphysema.  #3 diarrhea-  now resolved was previously. 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 She was previously on on Lialda, budesonide,probiotics and lomotil but not currently taking any of these. Plan -Continue  Sandostatin every 4 weeks   #4 h/o diverticulitis and c diff colitis - now resolved Plan  -continue on sandostatin today and q4weeks.  -continue on Lialda  #5 DVT and PE  -continue on Xarelto - no issues with bleeding   PLAN: -Discussed pt labwork today, 03/13/19; Potassium lower at 3.3, Magnesium lower at 1.5. Blood counts are stable. -03/06/19 SARS-CoV-2 Negative -03/06/19 CXR revealed clear lung fields, normal heart size -Reviewed the 03/07/19 and 02/25/19 EKGs, no overt concern but some decreased QRS amplitude -Will order ECHO to rule out cardiac etiology of her SOB -Will refer to pulmonology for further evaluation and lung function testing -Will begin steroid inhaler to mitigate possible inflammation in airway, could be some radiation related scarring and emphysema -Hold Amlodipine, BP at 109/56 in clinic today and pt is eating less -Offered home health services and pt prefers this -Discussed proceeding with treatment today, which pt does prefer. She does not have any overt, prohibitive toxicities from continuing this today -Fourth Carboplatin and Taxol infusion today -Start PO Magnesium and PO Potassium replacement -Holding Sandostatin until cardiac and pulm is further evaluated, no concern for diarrhea at this time -Pt began RT on 02/04/19 with Dr. Lisbeth Renshaw in [place] with Delton See every 4 weeks, no dental concerns at this time -Continue 2000 units Vitamin D every day, with dose adjustment to maintain 25OH Vit D levels  of 40-60 -Trazodone for sleep difficulty -Will see the pt back in 1 week   RTC in 1 week for final cycle of chemotherapy with labs. Plz add MD appointment May discontinue Chemotherapy appointment currently scheduled for 4/22 Home health services referral for RN and PT (RN to call to setup) and home oxygen ECHO urgently. Pulmonary referral urgently for shortness of breath   All questions were answered. The patient knows to call the clinic with any problems,  questions or concerns.  The total time spent in the appt was 30 minutes and more than 50% was on counseling and direct patient cares.   Sullivan Lone MD Cindy Hematology/Oncology Physician [facility]  (Office): [phone] (Work cell): [phone] (Fax): [phone]  I, Baldwin Jamaica, am acting as a scribe for Dr. Sullivan Lone.   .I have reviewed the above documentation for accuracy and completeness, and I agree with the above. [name] MD

## 2019-03-13 ENCOUNTER — Ambulatory Visit
Admission: RE | Admit: 2019-03-13 | Discharge: 2019-03-13 | Disposition: A | Discharge status: Home / Self Care | Payer: Medicare Other | Source: Ambulatory Visit | Attending: Radiation Oncology | Admitting: Radiation Oncology

## 2019-03-13 ENCOUNTER — Inpatient Hospital Stay: Payer: Medicare Other

## 2019-03-13 ENCOUNTER — Other Ambulatory Visit: Payer: Self-pay | Admitting: *Deleted

## 2019-03-13 ENCOUNTER — Telehealth: Payer: Self-pay | Admitting: Hematology

## 2019-03-13 ENCOUNTER — Inpatient Hospital Stay (HOSPITAL_BASED_OUTPATIENT_CLINIC_OR_DEPARTMENT_OTHER): Payer: Medicare Other | Admitting: Hematology

## 2019-03-13 ENCOUNTER — Ambulatory Visit: Payer: Medicare Other

## 2019-03-13 ENCOUNTER — Other Ambulatory Visit: Payer: Self-pay

## 2019-03-13 VITALS — HR 92

## 2019-03-13 VITALS — BP 109/56 | HR 106 | Temp 98.3°F | Resp 18 | Ht 68.5 in | Wt 141.9 lb

## 2019-03-13 DIAGNOSIS — R197 Diarrhea, unspecified: Secondary | ICD-10-CM

## 2019-03-13 DIAGNOSIS — Z7189 Other specified counseling: Secondary | ICD-10-CM

## 2019-03-13 DIAGNOSIS — C155 Malignant neoplasm of lower third of esophagus: Secondary | ICD-10-CM

## 2019-03-13 DIAGNOSIS — C3490 Malignant neoplasm of unspecified part of unspecified bronchus or lung: Secondary | ICD-10-CM

## 2019-03-13 DIAGNOSIS — I2699 Other pulmonary embolism without acute cor pulmonale: Secondary | ICD-10-CM

## 2019-03-13 DIAGNOSIS — C160 Malignant neoplasm of cardia: Secondary | ICD-10-CM

## 2019-03-13 DIAGNOSIS — D72819 Decreased white blood cell count, unspecified: Secondary | ICD-10-CM | POA: Diagnosis not present

## 2019-03-13 DIAGNOSIS — C774 Secondary and unspecified malignant neoplasm of inguinal and lower limb lymph nodes: Secondary | ICD-10-CM | POA: Insufficient documentation

## 2019-03-13 DIAGNOSIS — R05 Cough: Secondary | ICD-10-CM

## 2019-03-13 DIAGNOSIS — R0602 Shortness of breath: Secondary | ICD-10-CM | POA: Insufficient documentation

## 2019-03-13 DIAGNOSIS — Z87891 Personal history of nicotine dependence: Secondary | ICD-10-CM | POA: Diagnosis not present

## 2019-03-13 DIAGNOSIS — C3491 Malignant neoplasm of unspecified part of right bronchus or lung: Secondary | ICD-10-CM

## 2019-03-13 DIAGNOSIS — Z95828 Presence of other vascular implants and grafts: Secondary | ICD-10-CM

## 2019-03-13 DIAGNOSIS — C7951 Secondary malignant neoplasm of bone: Secondary | ICD-10-CM | POA: Insufficient documentation

## 2019-03-13 DIAGNOSIS — I82401 Acute embolism and thrombosis of unspecified deep veins of right lower extremity: Secondary | ICD-10-CM

## 2019-03-13 DIAGNOSIS — Z5111 Encounter for antineoplastic chemotherapy: Secondary | ICD-10-CM | POA: Insufficient documentation

## 2019-03-13 DIAGNOSIS — E876 Hypokalemia: Secondary | ICD-10-CM

## 2019-03-13 DIAGNOSIS — N23 Unspecified renal colic: Secondary | ICD-10-CM | POA: Insufficient documentation

## 2019-03-13 DIAGNOSIS — K521 Toxic gastroenteritis and colitis: Secondary | ICD-10-CM

## 2019-03-13 DIAGNOSIS — E559 Vitamin D deficiency, unspecified: Secondary | ICD-10-CM

## 2019-03-13 DIAGNOSIS — Z510 Encounter for antineoplastic radiation therapy: Secondary | ICD-10-CM | POA: Diagnosis not present

## 2019-03-13 DIAGNOSIS — K529 Noninfective gastroenteritis and colitis, unspecified: Secondary | ICD-10-CM

## 2019-03-13 LAB — MAGNESIUM: Magnesium: 1.5 mg/dL — ABNORMAL LOW (ref 1.7–2.4)

## 2019-03-13 LAB — CBC WITH DIFFERENTIAL/PLATELET
Abs Immature Granulocytes: 0.02 10*3/uL (ref 0.00–0.07)
Basophils Absolute: 0 10*3/uL (ref 0.0–0.1)
Basophils Relative: 1 %
Eosinophils Absolute: 0.1 10*3/uL (ref 0.0–0.5)
Eosinophils Relative: 3 %
HCT: 32.6 % — ABNORMAL LOW (ref 36.0–46.0)
Hemoglobin: 10.3 g/dL — ABNORMAL LOW (ref 12.0–15.0)
Immature Granulocytes: 1 %
Lymphocytes Relative: 12 %
Lymphs Abs: 0.4 10*3/uL — ABNORMAL LOW (ref 0.7–4.0)
MCH: 26.9 pg (ref 26.0–34.0)
MCHC: 31.6 g/dL (ref 30.0–36.0)
MCV: 85.1 fL (ref 80.0–100.0)
Monocytes Absolute: 0.4 10*3/uL (ref 0.1–1.0)
Monocytes Relative: 11 %
Neutro Abs: 2.5 10*3/uL (ref 1.7–7.7)
Neutrophils Relative %: 72 %
Platelets: 150 10*3/uL (ref 150–400)
RBC: 3.83 MIL/uL — ABNORMAL LOW (ref 3.87–5.11)
RDW: 17.7 % — ABNORMAL HIGH (ref 11.5–15.5)
WBC: 3.3 10*3/uL — ABNORMAL LOW (ref 4.0–10.5)
nRBC: 0 % (ref 0.0–0.2)

## 2019-03-13 LAB — CMP (CANCER CENTER ONLY)
ALT: 12 U/L (ref 0–44)
AST: 32 U/L (ref 15–41)
Albumin: 2.7 g/dL — ABNORMAL LOW (ref 3.5–5.0)
Alkaline Phosphatase: 64 U/L (ref 38–126)
Anion gap: 9 (ref 5–15)
BUN: 16 mg/dL (ref 8–23)
CO2: 23 mmol/L (ref 22–32)
Calcium: 8.1 mg/dL — ABNORMAL LOW (ref 8.9–10.3)
Chloride: 107 mmol/L (ref 98–111)
Creatinine: 1.19 mg/dL — ABNORMAL HIGH (ref 0.44–1.00)
GFR, Est AFR Am: 50 mL/min — ABNORMAL LOW (ref >60)
GFR, Estimated: 43 mL/min — ABNORMAL LOW (ref >60)
Glucose, Bld: 149 mg/dL — ABNORMAL HIGH (ref 70–99)
Potassium: 3.3 mmol/L — ABNORMAL LOW (ref 3.5–5.1)
Sodium: 139 mmol/L (ref 135–145)
Total Bilirubin: 0.4 mg/dL (ref 0.3–1.2)
Total Protein: 6.5 g/dL (ref 6.5–8.1)

## 2019-03-13 LAB — PHOSPHORUS: Phosphorus: 3 mg/dL (ref 2.5–4.6)

## 2019-03-13 MED ORDER — FAMOTIDINE IN NACL 20-0.9 MG/50ML-% IV SOLN: 20.0000 mg | Freq: Once | INTRAVENOUS | Status: DC

## 2019-03-13 MED ORDER — DIPHENHYDRAMINE HCL 50 MG/ML IJ SOLN
50.0000 mg | Freq: Once | INTRAMUSCULAR | Status: AC
  Administered 2019-03-13: 10:00:00 50 mg via INTRAVENOUS

## 2019-03-13 MED ORDER — PALONOSETRON HCL INJECTION 0.25 MG/5ML
0.2500 mg | Freq: Once | INTRAVENOUS | Status: AC
  Administered 2019-03-13: 10:00:00 0.25 mg via INTRAVENOUS

## 2019-03-13 MED ORDER — PALONOSETRON HCL INJECTION 0.25 MG/5ML
INTRAVENOUS | Status: AC
  Filled 2019-03-13: qty 5

## 2019-03-13 MED ORDER — POTASSIUM CHLORIDE CRYS ER 20 MEQ PO TBCR: 40.0000 meq | EXTENDED_RELEASE_TABLET | Freq: Once | ORAL | Status: DC

## 2019-03-13 MED ORDER — SODIUM CHLORIDE 0.9 % IV SOLN
20.0000 mg | Freq: Once | INTRAVENOUS | Status: AC
  Administered 2019-03-13: 11:00:00 20 mg via INTRAVENOUS
  Filled 2019-03-13: qty 2

## 2019-03-13 MED ORDER — SODIUM CHLORIDE 0.9 % IV SOLN
Freq: Once | INTRAVENOUS | Status: AC
  Administered 2019-03-13: 10:00:00 via INTRAVENOUS
  Filled 2019-03-13: qty 250

## 2019-03-13 MED ORDER — SODIUM CHLORIDE 0.9% FLUSH
10.0000 mL | Freq: Once | INTRAVENOUS | Status: AC
  Administered 2019-03-13: 10 mL
  Filled 2019-03-13: qty 10

## 2019-03-13 MED ORDER — SODIUM CHLORIDE 0.9 % IV SOLN: 2.0000 g | Freq: Once | INTRAVENOUS | Status: DC

## 2019-03-13 MED ORDER — MAGNESIUM 500 MG PO CAPS: 1.0000 | ORAL_CAPSULE | Freq: Every day | ORAL | 1 refills | Status: DC

## 2019-03-13 MED ORDER — SODIUM CHLORIDE 0.9 % IV SOLN
130.0000 mg | Freq: Once | INTRAVENOUS | Status: AC
  Administered 2019-03-13: 13:00:00 130 mg via INTRAVENOUS
  Filled 2019-03-13: qty 13

## 2019-03-13 MED ORDER — POTASSIUM CHLORIDE CRYS ER 20 MEQ PO TBCR: 20.0000 meq | EXTENDED_RELEASE_TABLET | Freq: Two times a day (BID) | ORAL | 1 refills | Status: DC

## 2019-03-13 MED ORDER — DENOSUMAB 120 MG/1.7ML ~~LOC~~ SOLN
SUBCUTANEOUS | Status: AC
  Filled 2019-03-13: qty 1.7

## 2019-03-13 MED ORDER — DIPHENHYDRAMINE HCL 50 MG/ML IJ SOLN
INTRAMUSCULAR | Status: AC
  Filled 2019-03-13: qty 1

## 2019-03-13 MED ORDER — SODIUM CHLORIDE 0.9% FLUSH
10.0000 mL | INTRAVENOUS | Status: DC | PRN
  Administered 2019-03-13: 14:00:00 10 mL
  Filled 2019-03-13: qty 10

## 2019-03-13 MED ORDER — MAGNESIUM OXIDE 400 (241.3 MG) MG PO TABS: 400.0000 mg | ORAL_TABLET | Freq: Every day | ORAL | 1 refills | Status: DC

## 2019-03-13 MED ORDER — MAGNESIUM SULFATE 2 GM/50ML IV SOLN
2.0000 g | Freq: Once | INTRAVENOUS | Status: AC
  Administered 2019-03-13: 2 g via INTRAVENOUS
  Filled 2019-03-13: qty 50

## 2019-03-13 MED ORDER — SODIUM CHLORIDE 0.9 % IV SOLN
20.0000 mg | Freq: Once | INTRAVENOUS | Status: AC
  Administered 2019-03-13: 11:00:00 20 mg via INTRAVENOUS
  Filled 2019-03-13: qty 20

## 2019-03-13 MED ORDER — POTASSIUM CHLORIDE CRYS ER 20 MEQ PO TBCR
EXTENDED_RELEASE_TABLET | ORAL | Status: AC
  Filled 2019-03-13: qty 1

## 2019-03-13 MED ORDER — DENOSUMAB 120 MG/1.7ML ~~LOC~~ SOLN
120.0000 mg | Freq: Once | SUBCUTANEOUS | Status: AC
  Administered 2019-03-13: 12:00:00 120 mg via SUBCUTANEOUS

## 2019-03-13 MED ORDER — POTASSIUM CHLORIDE CRYS ER 20 MEQ PO TBCR
40.0000 meq | EXTENDED_RELEASE_TABLET | Freq: Once | ORAL | Status: AC
  Administered 2019-03-13: 11:00:00 40 meq via ORAL

## 2019-03-13 MED ORDER — HEPARIN SOD (PORK) LOCK FLUSH 100 UNIT/ML IV SOLN
500.0000 [IU] | Freq: Once | INTRAVENOUS | Status: AC | PRN
  Administered 2019-03-13: 14:00:00 500 [IU]
  Filled 2019-03-13: qty 5

## 2019-03-13 MED ORDER — SODIUM CHLORIDE 0.9 % IV SOLN
50.0000 mg/m2 | Freq: Once | INTRAVENOUS | Status: AC
  Administered 2019-03-13: 90 mg via INTRAVENOUS
  Filled 2019-03-13: qty 15

## 2019-03-13 NOTE — Progress Notes (Signed)
hosphorous

## 2019-03-13 NOTE — Addendum Note (Signed)
Addended by: Neysa Hotter on: 03/13/2019 02:02 PM   Modules accepted: Orders

## 2019-03-13 NOTE — Patient Instructions (Signed)
Cancer Center Discharge Instructions for Patients Receiving Chemotherapy  Today you received the following chemotherapy agents Taxol, Carboplatin  To help prevent nausea and vomiting after your treatment, we encourage you to take your nausea medication as directed  If you develop nausea and vomiting that is not controlled by your nausea medication, call the clinic.   BELOW ARE SYMPTOMS THAT SHOULD BE REPORTED IMMEDIATELY:  *FEVER GREATER THAN 100.5 F  *CHILLS WITH OR WITHOUT FEVER  NAUSEA AND VOMITING THAT IS NOT CONTROLLED WITH YOUR NAUSEA MEDICATION  *UNUSUAL SHORTNESS OF BREATH  *UNUSUAL BRUISING OR BLEEDING  TENDERNESS IN MOUTH AND THROAT WITH OR WITHOUT PRESENCE OF ULCERS  *URINARY PROBLEMS  *BOWEL PROBLEMS  UNUSUAL RASH Items with * indicate a potential emergency and should be followed up as soon as possible.  Feel free to call the clinic should you have any questions or concerns. The clinic phone number is [phone].  Please show the CHEMO ALERT CARD at check-in to the Emergency Department and triage nurse.   

## 2019-03-13 NOTE — Telephone Encounter (Signed)
Scheduled appt per 4/8 los.

## 2019-03-14 ENCOUNTER — Ambulatory Visit: Payer: Medicare Other

## 2019-03-14 ENCOUNTER — Ambulatory Visit (HOSPITAL_COMMUNITY)
Admission: RE | Admit: 2019-03-14 | Discharge: 2019-03-14 | Disposition: A | Discharge status: Home / Self Care | Payer: Medicare Other | Source: Ambulatory Visit | Attending: Hematology | Admitting: Hematology

## 2019-03-14 ENCOUNTER — Other Ambulatory Visit: Payer: Self-pay

## 2019-03-14 ENCOUNTER — Ambulatory Visit
Admission: RE | Admit: 2019-03-14 | Discharge: 2019-03-14 | Disposition: A | Discharge status: Home / Self Care | Payer: Medicare Other | Source: Ambulatory Visit | Attending: Radiation Oncology | Admitting: Radiation Oncology

## 2019-03-14 DIAGNOSIS — R0602 Shortness of breath: Secondary | ICD-10-CM | POA: Diagnosis not present

## 2019-03-14 DIAGNOSIS — C155 Malignant neoplasm of lower third of esophagus: Secondary | ICD-10-CM | POA: Diagnosis not present

## 2019-03-14 DIAGNOSIS — Z510 Encounter for antineoplastic radiation therapy: Secondary | ICD-10-CM | POA: Diagnosis not present

## 2019-03-14 NOTE — Progress Notes (Signed)
  Echocardiogram 2D Echocardiogram has been performed.  Jennette Dubin 03/14/2019, 12:00 PM

## 2019-03-15 ENCOUNTER — Other Ambulatory Visit: Payer: Self-pay

## 2019-03-15 ENCOUNTER — Ambulatory Visit
Admission: RE | Admit: 2019-03-15 | Discharge: 2019-03-15 | Disposition: A | Discharge status: Home / Self Care | Payer: Medicare Other | Source: Ambulatory Visit | Attending: Radiation Oncology | Admitting: Radiation Oncology

## 2019-03-15 ENCOUNTER — Ambulatory Visit: Payer: Medicare Other

## 2019-03-15 DIAGNOSIS — C155 Malignant neoplasm of lower third of esophagus: Secondary | ICD-10-CM | POA: Diagnosis not present

## 2019-03-15 DIAGNOSIS — Z510 Encounter for antineoplastic radiation therapy: Secondary | ICD-10-CM | POA: Diagnosis not present

## 2019-03-18 ENCOUNTER — Other Ambulatory Visit: Payer: Self-pay

## 2019-03-18 ENCOUNTER — Telehealth: Payer: Self-pay | Admitting: *Deleted

## 2019-03-18 ENCOUNTER — Ambulatory Visit: Payer: Medicare Other

## 2019-03-18 ENCOUNTER — Ambulatory Visit
Admission: RE | Admit: 2019-03-18 | Discharge: 2019-03-18 | Disposition: A | Discharge status: Home / Self Care | Payer: Medicare Other | Source: Ambulatory Visit | Attending: Radiation Oncology | Admitting: Radiation Oncology

## 2019-03-18 DIAGNOSIS — Z510 Encounter for antineoplastic radiation therapy: Secondary | ICD-10-CM | POA: Diagnosis not present

## 2019-03-18 DIAGNOSIS — C155 Malignant neoplasm of lower third of esophagus: Secondary | ICD-10-CM | POA: Diagnosis not present

## 2019-03-18 NOTE — Telephone Encounter (Signed)
Cindy Byrd, Nurse with [facility] contacted office with FYI. She contacted patient to set up appts for home health nurse this week and services were declined by patient for this week d/t multiple appts for radiation, chemo and with MD. Patient stated it would be overwhelming. Services will begin week of 03/25/2019.

## 2019-03-19 ENCOUNTER — Ambulatory Visit
Admission: RE | Admit: 2019-03-19 | Discharge: 2019-03-19 | Disposition: A | Discharge status: Home / Self Care | Payer: Medicare Other | Source: Ambulatory Visit | Attending: Radiation Oncology | Admitting: Radiation Oncology

## 2019-03-19 ENCOUNTER — Other Ambulatory Visit: Payer: Self-pay

## 2019-03-19 ENCOUNTER — Ambulatory Visit: Payer: Medicare Other

## 2019-03-19 DIAGNOSIS — Z510 Encounter for antineoplastic radiation therapy: Secondary | ICD-10-CM | POA: Diagnosis not present

## 2019-03-19 DIAGNOSIS — C155 Malignant neoplasm of lower third of esophagus: Secondary | ICD-10-CM | POA: Diagnosis not present

## 2019-03-19 NOTE — Progress Notes (Signed)
HEMATOLOGY ONCOLOGY PROGRESS NOTE  Date of service:  03/20/19    Patient Care Team: [name], NP as PCP - General (Family Medicine) [name], MD as Consulting Physician (Hematology and Oncology)  CHIEF COMPLAINTS/PURPOSE OF CONSULTATION:  F/u for metastatic lung cancer  DIAGNOSIS:   #1 Metastatic non-small cell lung cancer with bilateral lung nodules and large metastatic lesion in the left Ilium. #2 Barrett's esophagus with some evidence of intramucosal adenocarcinoma of the esophagus. 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 Also had c diff colitis - treated   Current Treatment  1) Active surveillance 2) Xgeva 174m [place] q4weeks for bone metastases. 3) Sandostatin q4weeks for diarrhea - immune colitis  Previous Treatment  1 Palliative radiation therapy to the large left ilium metastases 2. IV Nivolumab x 20ycles (discontinued due to likely immune colitis) 3. Xgeva 1249mSC q4weeks for bone metastases.   HISTORY OF PRESENTING ILLNESS: (plz see my previous consultation for details of initial presentation)  INTERVAL HISTORY:   Cindy ToMcconahys here for her scheduled follow-up for metastatic lung cancer, and newly diagnosed adenocarcinoma of the esophagus. The patient's last visit with [place] on 03/13/19. The pt reports that she is doing well overall.   The pt reports that she does not have pain while swallowing and denies problems swallowing. She notes that she is eating a third of what she normally eats, and that this is because "nothing sounds good." She is consuming 2 Ensures a day. She notes that she feels dizzy when she stands up and occasionally feels nauseous. She notes that she continues to feel SOB, however this is mildly improved when she stands now, compared to at our last visit.  The pt is anticipating her last RT on 03/22/19 and her last infusion today. She denies  skin rashes or toxicity and denies problems with her port.   The pt notes that she is having decreased urination and discomfort while urinating. She denies fevers.   Of note since the patient's last visit, pt has had an ECHO completed on 03/14/19 with results revealing LV EF of 60-65%.  Lab results today (03/20/19) of CBC w/diff and CMP is as follows: all values are WNL except for WBC at 2.2k, RBC at 3.70, HGB at 9.9, HCT at 32.0, RDW at 17.4, PLT at 130k, Lymphs abs at 100, CO2 at 21, Glucose at 145, Calcium at 7.8, Total Protein at 6.4, Albumin at 2.6. 03/20/19 Magnesium is at 1.6  On review of systems, pt reports feeling weak, somewhat improved SOB, dizziness when standing, weaker appetite, and denies abdominal pains, leg swelling, and any other symptoms.    MEDICAL HISTORY:  Past Medical History:  Diagnosis Date  . Barrett's esophagus   . Bone neoplasm 06/24/2015  [facility]   metastatic poorly differentiated carcinoma. tumor left groin surgical removal with radiation tx.  . Cataract    BILATERAL  . Cigarette smoker two packs a day or less    Currently still smoking 2 PPD - Not interested in quitting at this time.  . Colitis 2017  . Colon polyps    hyperplastic, tubular adenomas, tubulovillous adenoma  . Cough, persistent    hx. lung cancer ? primary-being evaluated, unsure of primary site.  . Depression 06/24/2015  . Diverticulosis   . Em[place]sema of lung ([facility]  . Endometriosis    Hysterectomy with BSO at age [age]rs  .  Esophageal adenocarcinoma ([place]) 08/11/15   intramucosal  . Gastritis   . GERD (gastroesophageal reflux disease)   . H/O: pneumonia   . Hiatal hernia   . Hyperlipidemia   . Hypertension 06/24/2015   likely improved incidental to 40 lbs weight loss from her neoplasm. No Longer taking med for this as of 08-06-15  . IBS (irritable bowel syndrome)   . Pain    left hip-persistent"tumor of bone"-radiation tx. 10.  . Vitamin D deficiency disease    SURGICAL  HISTORY: Past Surgical History:  Procedure Laterality Date  . BARTHOLIN GLAND CYST EXCISION  78 yo ago   Does not want if it was an infected cyst or tumor. Was soon as delivery  . BIOPSY  01/02/2019   Procedure: BIOPSY;  Surgeon: [name], MD;  Location: [facility];  Service: Gastroenterology;;  . CATARACT EXTRACTION    . COLONOSCOPY W/ POLYPECTOMY     multiple times - last done 09/2014 per patient.  . ESOPHAGOGASTRODUODENOSCOPY (EGD) WITH PROPOFOL N/A 08/11/2015   Procedure: ESOPHAGOGASTRODUODENOSCOPY (EGD) WITH PROPOFOL;  Surgeon: [name], MD;  Location: [facility];  Service: Gastroenterology;  Laterality: N/A;  . ESOPHAGOGASTRODUODENOSCOPY (EGD) WITH PROPOFOL N/A 01/02/2019   Procedure: ESOPHAGOGASTRODUODENOSCOPY (EGD) WITH PROPOFOL;  Surgeon: [name], MD;  Location: [facility];  Service: Gastroenterology;  Laterality: N/A;  . FLEXIBLE SIGMOIDOSCOPY N/A 06/24/2017   Procedure: FLEXIBLE SIGMOIDOSCOPY;  Surgeon: Manus Gunning, MD;  Location: [facility];  Service: Gastroenterology;  Laterality: N/A;  . GANGLION CYST EXCISION    . KNEE ARTHROSCOPY  age about 22 yrs  . TONSILLECTOMY    . TOTAL ABDOMINAL HYSTERECTOMY W/ BILATERAL SALPINGOOPHORECTOMY  at age 38 yrs   For endometriosis    SOCIAL HISTORY: Social History   Socioeconomic History  . Marital status: Widowed    Spouse name: Not on file  . Number of children: 2  . Years of education: Not on file  . Highest education level: Not on file  Occupational History  . Not on file  Social Needs  . Financial resource strain: Not on file  . Food insecurity:    Worry: Not on file    Inability: Not on file  . Transportation needs:    Medical: No    Non-medical: No  Tobacco Use  . Smoking status: Former Smoker    Packs/day: 1.00    Years: 60.00    Pack years: 60.00    Types: Cigarettes    Last attempt to quit: 12/06/2015    Years since quitting: 3.2  . Smokeless tobacco: Never Used  Substance and  Sexual Activity  . Alcohol use: No    Alcohol/week: 0.0 standard drinks  . Drug use: No  . Sexual activity: Never  Lifestyle  . Physical activity:    Days per week: Not on file    Minutes per session: Not on file  . Stress: Not on file  Relationships  . Social connections:    Talks on phone: Not on file    Gets together: Not on file    Attends religious service: Not on file    Active member of club or organization: Not on file    Attends meetings of clubs or organizations: Not on file    Relationship status: Not on file  . Intimate partner violence:    Fear of current or ex partner: Not on file    Emotionally abused: Not on file    Physically abused: Not on file  Forced sexual activity: Not on file  Other Topics Concern  . Not on file  Social History Narrative  . Not on file    FAMILY HISTORY: Family History  Problem Relation Age of Onset  . Colon cancer Brother   . Colon cancer Brother   . Stroke Mother   . Colon cancer Father   . Breast cancer Daughter 23       ER/PR+ stage II    ALLERGIES:  is allergic to penicillins; remeron [mirtazapine]; and latex. patient wonders if she has a penicillin allergy but notes that she is uncertain about this.  MEDICATIONS:  Current Outpatient Medications  Medication Sig Dispense Refill  . albuterol (PROVENTIL HFA;VENTOLIN HFA) 108 (90 Base) MCG/ACT inhaler Inhale 1 puff into the lungs every 6 (six) hours as needed for wheezing or shortness of breath. 1 Inhaler 1  . amitriptyline (ELAVIL) 25 MG tablet Take 25 mg by mouth at bedtime.     [name] amLODipine (NORVASC) 10 MG tablet TAKE 1 TABLET BY MOUTH EVERY DAY 90 tablet 0  . Cholecalciferol (VITAMIN D3) 25 MCG (1000 UT) CAPS Take 1,000 Units by mouth daily.    [name] dexamethasone (DECADRON) 4 MG tablet Take 2 tablets (8 mg total) by mouth daily. Start the day after chemotherapy for 2 days. 30 tablet 1  . FLUoxetine (PROZAC) 20 MG capsule Take 1 capsule (20 mg total) by mouth daily. 90  capsule 3  . lidocaine-prilocaine (EMLA) cream Apply to affected area once 30 g 3  . LORazepam (ATIVAN) 0.5 MG tablet Take 1 tablet (0.5 mg total) by mouth every 6 (six) hours as needed (Nausea or vomiting). 30 tablet 0  . magnesium oxide (MAG-OX) 400 (241.3 Mg) MG tablet Take 1 tablet (400 mg total) by mouth daily. 60 tablet 1  . naproxen sodium (ALEVE) 220 MG tablet Take 440 mg by mouth daily as needed (pain).    [name] omeprazole (PRILOSEC) 40 MG capsule TAKE 1 CAPSULE BY MOUTH EVERY DAY (Patient taking differently: Take 40 mg by mouth 2 (two) times daily. ) 90 capsule 2  . ondansetron (ZOFRAN) 8 MG tablet Take 1 tablet (8 mg total) by mouth 2 (two) times daily as needed for refractory nausea / vomiting. Start on day 3 after chemo. 30 tablet 1  . potassium chloride SA (K-DUR,KLOR-CON) 20 MEQ tablet Take 1 tablet (20 mEq total) by mouth 2 (two) times daily. 60 tablet 1  . Probiotic Product (PROBIOTIC PO) Take 1 capsule by mouth 2 (two) times daily.    . prochlorperazine (COMPAZINE) 10 MG tablet Take 1 tablet (10 mg total) by mouth every 6 (six) hours as needed (Nausea or vomiting). 30 tablet 1  . sucralfate (CARAFATE) 1 g tablet Take 1 tablet (1 g total) by mouth 4 (four) times daily. 120 tablet 2  . traZODone (DESYREL) 50 MG tablet TAKE 1 TABLET BY MOUTH EVERYDAY AT BEDTIME (Patient taking differently: Take 50 mg by mouth at bedtime. ) 90 tablet 2  . Vitamin D, Ergocalciferol, (DRISDOL) 1.25 MG (50000 UT) CAPS capsule Take 50,000 Units by mouth every 7 (seven) days.     [name] VITAMIN E PO Take 1 tablet by mouth daily.    Alveda Reasons 20 MG TABS tablet TAKE 1 TABLET DAILY WITH SUPPER. DC LOVENOX AND START XARELTO AT THE SCHEDULED TIME AS INSTRUCTED. (Patient taking differently: Take 20 mg by mouth daily with supper. ) 90 tablet 1   Current Facility-Administered Medications  Medication Dose Route Frequency Provider Last Rate  Last Dose  . magnesium sulfate 2 g in sodium chloride 0.9 % 500 mL  2 g Intravenous  Once Irene Limbo Cloria Spring, MD        REVIEW OF SYSTEMS:    A 10+ POINT REVIEW OF SYSTEMS WAS OBTAINED including neurology, dermatology, psychiatry, cardiac, respiratory, lymph, extremities, GI, GU, Musculoskeletal, constitutional, breasts, reproductive, HEENT.  All pertinent positives are noted in the HPI.  All others are negative.   PHYSICAL EXAMINATION: ECOG PERFORMANCE STATUS: 2 - Symptomatic, <50% confined to bed  Vitals:   03/20/19 0958  BP: 113/61  Pulse: 100  Resp: 18  Temp: 97.7 F (36.5 C)  SpO2: 100%   Filed Weights   03/20/19 0958  Weight: 139 lb 11.2 oz (63.4 kg)  . [name] Wt Readings from Last 3 Encounters:  03/20/19 139 lb 11.2 oz (63.4 kg)  03/13/19 141 lb 14.4 oz (64.4 kg)  03/06/19 144 lb (65.3 kg)   GENERAL:alert, in no acute distress and comfortable SKIN: no acute rashes, no significant lesions EYES: conjunctiva are pink and non-injected, sclera anicteric OROPHARYNX: MMM, no exudates, no oropharyngeal erythema or ulceration NECK: supple, no JVD LYMPH:  no palpable lymphadenopathy in the cervical, axillary or inguinal regions LUNGS: clear to auscultation b/l with normal respiratory effort HEART: regular rate & rhythm ABDOMEN:  normoactive bowel sounds , non tender, not distended. No palpable hepatosplenomegaly.  Extremity: no pedal edema PSYCH: alert & oriented x 3 with fluent speech NEURO: no focal motor/sensory deficits   LABORATORY DATA:  I have reviewed the data as listed  . CBC Latest Ref Rng & Units 03/20/2019 03/13/2019 03/06/2019  WBC 4.0 - 10.5 K/uL 2.2(L) 3.3(L) 3.2(L)  Hemoglobin 12.0 - 15.0 g/dL 9.9(L) 10.3(L) 10.6(L)  Hematocrit 36.0 - 46.0 % 32.0(L) 32.6(L) 33.5(L)  Platelets 150 - 400 K/uL 130(L) 150 183   ANC 1.8k . CMP Latest Ref Rng & Units 03/20/2019 03/13/2019 03/06/2019  Glucose 70 - 99 mg/dL 145(H) 149(H) 129(H)  BUN 8 - 23 mg/dL 13 16 16   Creatinine 0.44 - 1.00 mg/dL 0.87 1.19(H) 1.14(H)  Sodium 135 - 145 mmol/L 138 139 137   Potassium 3.5 - 5.1 mmol/L 4.0 3.3(L) 3.5  Chloride 98 - 111 mmol/L 107 107 104  CO2 22 - 32 mmol/L 21(L) 23 23  Calcium 8.9 - 10.3 mg/dL 7.8(L) 8.1(L) 8.9  Total Protein 6.5 - 8.1 g/dL 6.4(L) 6.5 6.7  Total Bilirubin 0.3 - 1.2 mg/dL 0.3 0.4 0.3  Alkaline Phos 38 - 126 U/L 63 64 59  AST 15 - 41 U/L 41 32 25  ALT 0 - 44 U/L 19 12 13        01/02/19 Esophagus Biopsy:    RADIOGRAPHIC STUDIES:  .Dg Chest 2 View  Result Date: 02/25/2019 CLINICAL DATA:  SOB x 2 days - hx pulmonary embolisms - currently being treated for esophageal cancer - current smoker EXAM: CHEST - 2 VIEW COMPARISON:  06/28/2018 FINDINGS: Coarse linear opacities in the lung bases left greater than right as before. No confluent airspace disease or overt edema. Heart size normal.  Aortic Atherosclerosis (ICD10-170.0). No pneumothorax. No pleural effusion. Right IJ port catheter to the distal SVC. Visualized bones unremarkable. IMPRESSION: No acute cardiopulmonary disease. Electronically Signed   By: Lucrezia Europe M.D.   On: 02/25/2019 15:16   Ct Angio Chest Pe W And/or Wo Contrast  Result Date: 02/25/2019 CLINICAL DATA:  Shortness of breath and fever.  Esophageal carcinoma EXAM: CT ANGIOGRAPHY CHEST WITH CONTRAST TECHNIQUE: Multidetector CT imaging of  the chest was performed using the standard protocol during bolus administration of intravenous contrast. Multiplanar CT image reconstructions and MIPs were obtained to evaluate the vascular anatomy. CONTRAST:  175m OMNIPAQUE IOHEXOL 350 MG/ML SOLN COMPARISON:  PET-CT January 25, 2019; chest radiograph February 25, 2019 prior chest CT with contrast July 19, 2018 FINDINGS: Cardiovascular: No pulmonary embolus evident. Note that there is pulmonary vascular attenuation in the left lower lobe, also present previously. There is no thoracic aortic aneurysm or dissection. There are foci of calcification in the proximal visualized great vessels. There are foci aortic atherosclerosis. There are  foci of coronary artery calcification. No pericardial effusion or pericardial thickening is evident. Mediastinum/Nodes: Thyroid appears somewhat diminutive but unremarkable. There is no appreciable thoracic adenopathy. There is thickening in the wall of the distal esophagus at the site of known esophageal carcinoma. Lungs/Pleura: There is underlying centrilobular emphysematous change. There are areas of lower lobe atelectatic change. No edema or consolidation. No pleural effusion or pleural thickening evident. There is a 4 mm nodular opacity in the lateral segment of the right lower lobe, also seen on recent studies. Upper Abdomen: There is aortic atherosclerosis in the upper abdomen region. Visualized upper abdominal structures otherwise appear unremarkable. Musculoskeletal: No blastic or lytic bone lesions evident. No appreciable chest wall lesions. Review of the MIP images confirms the above findings. IMPRESSION: 1. No demonstrable pulmonary embolus. No thoracic aortic aneurysm or dissection. There is aortic atherosclerosis. There are foci of great vessel and coronary artery calcification. 2. Thickening of the wall of the distal esophagus at the site of known esophageal carcinoma present. 3. Underlying centrilobular emphysematous change. Areas of atelectatic change. 4 mm nodular opacity right lower lobe, stable. A small metastasis cannot be excluded. 4.  No appreciable thoracic adenopathy. Aortic Atherosclerosis (ICD10-I70.0) and Emphysema (ICD10-J43.9). Electronically Signed   By: WLowella GripIII M.D.   On: 02/25/2019 18:25   Dg Chest Port 1 View  Result Date: 03/06/2019 CLINICAL DATA:  History of shortness of breath and fever. EXAM: PORTABLE CHEST 1 VIEW COMPARISON:  Chest radiograph 02/25/2019. FINDINGS: Normal heart size. Clear lung fields. No bony abnormality. Unchanged Port-A-Cath. Calcified tortuous aorta. IMPRESSION: Stable chest.  No active disease. Electronically Signed   By: JStaci RighterM.D.    On: 03/06/2019 15:06    ASSESSMENT & PLAN:   78y.o. female with  #1 Metastatic poorly differentiated carcinoma with likely lung primary non-small cell lung cancer.   CT of the head with and without contrast showed no evidence of metastatic disease. EGFR blood test mutation analysis negative. CT chest abdomen pelvis 04/19/2016 shows no evidence of disease progression. Patient tolerated Nivolumab very well but was discontinued when she developed grade 2 Immune colitis. Has been off Nivolumab for >6 months  CT chest abdomen pelvis on 06/24/2016 shows no evidence of new disease or progression of metastatic disease. CT chest abdomen pelvis 09/06/2016 shows 1. Mixed interval response to therapy. 2. There is a new left ventral chest wall lesion deep to the pectoralis musculature worrisome for metastatic disease. 3. Posterior lower lobe nodular densities are identified which may reflect areas of pulmonary metastasis. 4. Interval decrease in size of destructive lesion involving the left iliac bone.  CT chest abd pelvis 12/08/2016: Cystic mass involving the left ventral chest wall has resolved in the interval. Likely was a hematoma due to trauma. Interval increase in size of pleural base mass overlying the posterior and inferior left lower lobe. There is also a new left pleural  effusion identified.  CT chest 02/01/2017: Residual irregular soft tissue thickening/volume loss and trace left pleural fluid at the base of the left hemithorax, overall improved in appearance from 12/08/2016. No measurable lesion.  CT chest 05/29/2017 shows no residual pleural based mass or significant pleural effusion in the left hemithorax. No evidence of thoracic metastatic disease. No evidence of progressive metastatic disease within the abdomen or pelvis. Mixed lytic and blastic lesion involving the left iliac bone and associated pathologic fracture are unchanged.   CT CAP 09/14/17 shows no new changes. She does have slight  displacement of her fractured left iliac bone. Evidence of stable disease.   CT CAP 01/04/2018- No new or progressive metastatic disease. Stable large left iliac bone metastasis with associated chronic pathologic fracture.   CT chest/abd/pelvis done on 04/26/18 revealed Stable exam.  No new or progressive interval findings.  07/19/18 CT C/A/P revealed Stable exam.  No new or progressive interval findings. Large destructive left iliac lesion is similar to prior. Aortic Atherosclerosis and Emphysema.    11/06/18 CT C/A/P revealed Similar appearance of large mixed lytic and sclerotic lesion in the left ilium. No new metastatic lesions are otherwise noted elsewhere in the chest, abdomen or pelvis. 2. Interval development of thickening of the distal third of the esophagus. This is nonspecific, and could be related to underlying reflux esophagitis. However, if there is any clinical concern for Barrett's metaplasia or esophageal neoplasia, further evaluation with nonemergent endoscopy could be considered. 3. Aortic atherosclerosis, in addition to left main coronary artery disease. Assessment for potential risk factor modification, dietary therapy or pharmacologic therapy may be warranted, if clinically Indicated. 4. Diffuse bronchial wall thickening with mild to moderate centrilobular and paraseptal emphysema; imaging findings suggestive of underlying COPD. 5. Additional incidental findings, as above.   #2 Newly diagnosed Adenocarcinoma of the esophagus  Barrett's esophagus 4cms in the distal esophagus with low and high-grade dysplasia  01/02/19 Surgical pathology revealed adenocarcinoma of the esophagus   01/25/19 PET/CT revealed Distal esophageal primary, without hypermetabolic metastatic disease. 2. Chronic left iliac metastasis, as before. 3. Hypermetabolism within and superficial to the right gluteal musculature is most likely related to trauma and/or injection sites. 4. Aortic atherosclerosis, coronary  artery atherosclerosis and emphysema.  #3 diarrhea-  now resolved was previously. 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 She was previously on on Lialda, budesonide,probiotics and lomotil but not currently taking any of these. Plan -Continue Sandostatin every 4 weeks   #4 h/o diverticulitis and c diff colitis - now resolved Plan  -continue on sandostatin today and q4weeks.  -continue on Lialda  #5 DVT and PE  -continue on Xarelto - no issues with bleeding   PLAN: -Discussed pt labwork today, 03/20/19; leukopenia without neutropenia. HGB a little lower at 9.9. PLT slightly lower at 130k. Chemistries are stable. Magnesium improved to 1.6 -The pt has no prohibitive toxicities from continuing her fifth and final planned Carboplatin and Taxol infusion at this time.  -Pt will complete RT this week on 03/22/19 -Stressed the importance of consuming food and maintaining her nutrition, staying active, staying hydrated, and resting well -Low dose Marinol for appetite stimulation which pt prefers. -Will continue to support pt with IV Fluids weekly with 2 liters of normal saline and 4g Magnesium, for 4 weeks -Will continue monthly Sandostatin -03/14/19 ECHO revealed LV EF of 60-65% -03/06/19 CXR revealed clear lung fields,  normal heart size -Reviewed the 03/07/19 and 02/25/19 EKGs, no overt concern but some decreased QRS amplitude -Did refer to pulmonology for further evaluation and lung function testing -Began steroid inhaler to mitigate possible inflammation in airway, could be some radiation related scarring and emphysema -Hold Amlodipine, BP at 109/56 in clinic today and pt is eating less -Follow up with home health services -Start PO Magnesium and PO Potassium replacement -Pt began RT on 02/04/19 with Dr. Lisbeth Renshaw in [place] with Delton See every 4 weeks, no dental concerns at this  time -Continue 2000 units Vitamin D every day, with dose adjustment to maintain 25OH Vit D levels of 40-60 -Trazodone for sleep difficulty -Will see the pt back in 2 weeks   Last planned chemotherapy today Continue Xgeva and sandostatin q4weeks as scheduled Plz schedule for IVF 2 Liters of NS + 2g of Magnesium weekly x 4 weeks Weekly labs RTC with Dr Irene Limbo in 2 weeks    All questions were answered. The patient knows to call the clinic with any problems, questions or concerns.  The total time spent in the appt was 25 minutes and more than 50% was on counseling and direct patient cares.   Sullivan Lone MD [place] Hematology/Oncology Physician [facility]  (Office): [phone] (Work cell): [phone] (Fax): [phone]  I, Baldwin Jamaica, am acting as a scribe for Dr. Sullivan Lone.   .I have reviewed the above documentation for accuracy and completeness, and I agree with the above. [name] MD

## 2019-03-20 ENCOUNTER — Telehealth: Payer: Self-pay | Admitting: Hematology

## 2019-03-20 ENCOUNTER — Other Ambulatory Visit: Payer: Medicare Other

## 2019-03-20 ENCOUNTER — Ambulatory Visit: Payer: Medicare Other

## 2019-03-20 ENCOUNTER — Inpatient Hospital Stay (HOSPITAL_BASED_OUTPATIENT_CLINIC_OR_DEPARTMENT_OTHER): Payer: Medicare Other | Admitting: Hematology

## 2019-03-20 ENCOUNTER — Other Ambulatory Visit: Payer: Self-pay

## 2019-03-20 ENCOUNTER — Inpatient Hospital Stay: Payer: Medicare Other

## 2019-03-20 ENCOUNTER — Ambulatory Visit
Admission: RE | Admit: 2019-03-20 | Discharge: 2019-03-20 | Disposition: A | Discharge status: Home / Self Care | Payer: Medicare Other | Source: Ambulatory Visit | Attending: Radiation Oncology | Admitting: Radiation Oncology

## 2019-03-20 DIAGNOSIS — R0602 Shortness of breath: Secondary | ICD-10-CM | POA: Diagnosis not present

## 2019-03-20 DIAGNOSIS — K529 Noninfective gastroenteritis and colitis, unspecified: Secondary | ICD-10-CM

## 2019-03-20 DIAGNOSIS — Z510 Encounter for antineoplastic radiation therapy: Secondary | ICD-10-CM | POA: Diagnosis not present

## 2019-03-20 DIAGNOSIS — I82401 Acute embolism and thrombosis of unspecified deep veins of right lower extremity: Secondary | ICD-10-CM

## 2019-03-20 DIAGNOSIS — C3490 Malignant neoplasm of unspecified part of unspecified bronchus or lung: Secondary | ICD-10-CM

## 2019-03-20 DIAGNOSIS — C7951 Secondary malignant neoplasm of bone: Secondary | ICD-10-CM | POA: Diagnosis not present

## 2019-03-20 DIAGNOSIS — C774 Secondary and unspecified malignant neoplasm of inguinal and lower limb lymph nodes: Secondary | ICD-10-CM | POA: Diagnosis not present

## 2019-03-20 DIAGNOSIS — N23 Unspecified renal colic: Secondary | ICD-10-CM | POA: Diagnosis not present

## 2019-03-20 DIAGNOSIS — Z5111 Encounter for antineoplastic chemotherapy: Secondary | ICD-10-CM | POA: Diagnosis not present

## 2019-03-20 DIAGNOSIS — D72819 Decreased white blood cell count, unspecified: Secondary | ICD-10-CM | POA: Diagnosis not present

## 2019-03-20 DIAGNOSIS — R05 Cough: Secondary | ICD-10-CM | POA: Diagnosis not present

## 2019-03-20 DIAGNOSIS — R197 Diarrhea, unspecified: Secondary | ICD-10-CM

## 2019-03-20 DIAGNOSIS — I2699 Other pulmonary embolism without acute cor pulmonale: Secondary | ICD-10-CM

## 2019-03-20 DIAGNOSIS — Z87891 Personal history of nicotine dependence: Secondary | ICD-10-CM

## 2019-03-20 DIAGNOSIS — C155 Malignant neoplasm of lower third of esophagus: Secondary | ICD-10-CM

## 2019-03-20 DIAGNOSIS — K521 Toxic gastroenteritis and colitis: Secondary | ICD-10-CM

## 2019-03-20 DIAGNOSIS — Z95828 Presence of other vascular implants and grafts: Secondary | ICD-10-CM

## 2019-03-20 DIAGNOSIS — Z7189 Other specified counseling: Secondary | ICD-10-CM

## 2019-03-20 DIAGNOSIS — C3491 Malignant neoplasm of unspecified part of right bronchus or lung: Secondary | ICD-10-CM

## 2019-03-20 LAB — CBC WITH DIFFERENTIAL/PLATELET
Abs Immature Granulocytes: 0.04 10*3/uL (ref 0.00–0.07)
Basophils Absolute: 0 10*3/uL (ref 0.0–0.1)
Basophils Relative: 1 %
Eosinophils Absolute: 0.1 10*3/uL (ref 0.0–0.5)
Eosinophils Relative: 3 %
HCT: 32 % — ABNORMAL LOW (ref 36.0–46.0)
Hemoglobin: 9.9 g/dL — ABNORMAL LOW (ref 12.0–15.0)
Immature Granulocytes: 2 %
Lymphocytes Relative: 6 %
Lymphs Abs: 0.1 10*3/uL — ABNORMAL LOW (ref 0.7–4.0)
MCH: 26.8 pg (ref 26.0–34.0)
MCHC: 30.9 g/dL (ref 30.0–36.0)
MCV: 86.5 fL (ref 80.0–100.0)
Monocytes Absolute: 0.2 10*3/uL (ref 0.1–1.0)
Monocytes Relative: 9 %
Neutro Abs: 1.8 10*3/uL (ref 1.7–7.7)
Neutrophils Relative %: 79 %
Platelets: 130 10*3/uL — ABNORMAL LOW (ref 150–400)
RBC: 3.7 MIL/uL — ABNORMAL LOW (ref 3.87–5.11)
RDW: 17.4 % — ABNORMAL HIGH (ref 11.5–15.5)
WBC: 2.2 10*3/uL — ABNORMAL LOW (ref 4.0–10.5)
nRBC: 0 % (ref 0.0–0.2)

## 2019-03-20 LAB — CMP (CANCER CENTER ONLY)
ALT: 19 U/L (ref 0–44)
AST: 41 U/L (ref 15–41)
Albumin: 2.6 g/dL — ABNORMAL LOW (ref 3.5–5.0)
Alkaline Phosphatase: 63 U/L (ref 38–126)
Anion gap: 10 (ref 5–15)
BUN: 13 mg/dL (ref 8–23)
CO2: 21 mmol/L — ABNORMAL LOW (ref 22–32)
Calcium: 7.8 mg/dL — ABNORMAL LOW (ref 8.9–10.3)
Chloride: 107 mmol/L (ref 98–111)
Creatinine: 0.87 mg/dL (ref 0.44–1.00)
GFR, Est AFR Am: >60 mL/min (ref >60)
GFR, Estimated: >60 mL/min (ref >60)
Glucose, Bld: 145 mg/dL — ABNORMAL HIGH (ref 70–99)
Potassium: 4 mmol/L (ref 3.5–5.1)
Sodium: 138 mmol/L (ref 135–145)
Total Bilirubin: 0.3 mg/dL (ref 0.3–1.2)
Total Protein: 6.4 g/dL — ABNORMAL LOW (ref 6.5–8.1)

## 2019-03-20 LAB — MAGNESIUM: Magnesium: 1.6 mg/dL — ABNORMAL LOW (ref 1.7–2.4)

## 2019-03-20 MED ORDER — SODIUM CHLORIDE 0.9 % IV SOLN
20.0000 mg | Freq: Once | INTRAVENOUS | Status: AC
  Administered 2019-03-20: 20 mg via INTRAVENOUS
  Filled 2019-03-20: qty 20

## 2019-03-20 MED ORDER — OCTREOTIDE ACETATE 30 MG IM KIT
30.0000 mg | PACK | Freq: Once | INTRAMUSCULAR | Status: AC
  Administered 2019-03-20: 16:00:00 30 mg via INTRAMUSCULAR

## 2019-03-20 MED ORDER — DIPHENHYDRAMINE HCL 50 MG/ML IJ SOLN
INTRAMUSCULAR | Status: AC
  Filled 2019-03-20: qty 1

## 2019-03-20 MED ORDER — SODIUM CHLORIDE 0.9 % IV SOLN
148.0000 mg | Freq: Once | INTRAVENOUS | Status: AC
  Administered 2019-03-20: 150 mg via INTRAVENOUS
  Filled 2019-03-20: qty 15

## 2019-03-20 MED ORDER — DIPHENHYDRAMINE HCL 50 MG/ML IJ SOLN
50.0000 mg | Freq: Once | INTRAMUSCULAR | Status: AC
  Administered 2019-03-20: 14:00:00 50 mg via INTRAVENOUS

## 2019-03-20 MED ORDER — HEPARIN SOD (PORK) LOCK FLUSH 100 UNIT/ML IV SOLN
500.0000 [IU] | Freq: Once | INTRAVENOUS | Status: AC | PRN
  Administered 2019-03-20: 17:00:00 500 [IU]
  Filled 2019-03-20: qty 5

## 2019-03-20 MED ORDER — PALONOSETRON HCL INJECTION 0.25 MG/5ML
0.2500 mg | Freq: Once | INTRAVENOUS | Status: AC
  Administered 2019-03-20: 14:00:00 0.25 mg via INTRAVENOUS

## 2019-03-20 MED ORDER — OCTREOTIDE ACETATE 30 MG IM KIT
PACK | INTRAMUSCULAR | Status: AC
  Filled 2019-03-20: qty 1

## 2019-03-20 MED ORDER — SODIUM CHLORIDE 0.9 % IV SOLN
2.0000 g | Freq: Once | INTRAVENOUS | Status: AC
  Administered 2019-03-20: 2 g via INTRAVENOUS
  Filled 2019-03-20: qty 4

## 2019-03-20 MED ORDER — SODIUM CHLORIDE 0.9 % IV SOLN
50.0000 mg/m2 | Freq: Once | INTRAVENOUS | Status: AC
  Administered 2019-03-20: 90 mg via INTRAVENOUS
  Filled 2019-03-20: qty 15

## 2019-03-20 MED ORDER — SODIUM CHLORIDE 0.9% FLUSH
10.0000 mL | Freq: Once | INTRAVENOUS | Status: AC
  Administered 2019-03-20: 10 mL
  Filled 2019-03-20: qty 10

## 2019-03-20 MED ORDER — SODIUM CHLORIDE 0.9 % IV SOLN
20.0000 mg | Freq: Once | INTRAVENOUS | Status: AC
  Administered 2019-03-20: 20 mg via INTRAVENOUS
  Filled 2019-03-20: qty 2

## 2019-03-20 MED ORDER — FAMOTIDINE IN NACL 20-0.9 MG/50ML-% IV SOLN: 20.0000 mg | Freq: Once | INTRAVENOUS | Status: DC

## 2019-03-20 MED ORDER — PALONOSETRON HCL INJECTION 0.25 MG/5ML
INTRAVENOUS | Status: AC
  Filled 2019-03-20: qty 5

## 2019-03-20 MED ORDER — SODIUM CHLORIDE 0.9 % IV SOLN
Freq: Once | INTRAVENOUS | Status: AC
  Administered 2019-03-20: 14:00:00 via INTRAVENOUS
  Filled 2019-03-20: qty 250

## 2019-03-20 MED ORDER — SODIUM CHLORIDE 0.9% FLUSH
10.0000 mL | INTRAVENOUS | Status: DC | PRN
  Administered 2019-03-20: 17:00:00 10 mL
  Filled 2019-03-20: qty 10

## 2019-03-20 NOTE — Telephone Encounter (Signed)
Scheduled appt per 4/15 los.

## 2019-03-20 NOTE — Patient Instructions (Signed)
[facility] Discharge Instructions for Patients Receiving Chemotherapy  Today you received the following chemotherapy agents Taxol, Carboplatin  To help prevent nausea and vomiting after your treatment, we encourage you to take your nausea medication as directed  If you develop nausea and vomiting that is not controlled by your nausea medication, call the clinic.   BELOW ARE SYMPTOMS THAT SHOULD BE REPORTED IMMEDIATELY:  *FEVER GREATER THAN 100.5 F  *CHILLS WITH OR WITHOUT FEVER  NAUSEA AND VOMITING THAT IS NOT CONTROLLED WITH YOUR NAUSEA MEDICATION  *UNUSUAL SHORTNESS OF BREATH  *UNUSUAL BRUISING OR BLEEDING  TENDERNESS IN MOUTH AND THROAT WITH OR WITHOUT PRESENCE OF ULCERS  *URINARY PROBLEMS  *BOWEL PROBLEMS  UNUSUAL RASH Items with * indicate a potential emergency and should be followed up as soon as possible.  Feel free to call the clinic should you have any questions or concerns. The clinic phone number is [phone].  Please show the CHEMO ALERT CARD at check-in to the Emergency Department and triage nurse.   

## 2019-03-21 ENCOUNTER — Other Ambulatory Visit: Payer: Self-pay

## 2019-03-21 ENCOUNTER — Ambulatory Visit: Payer: Medicare Other

## 2019-03-21 ENCOUNTER — Ambulatory Visit
Admission: RE | Admit: 2019-03-21 | Discharge: 2019-03-21 | Disposition: A | Discharge status: Home / Self Care | Payer: Medicare Other | Source: Ambulatory Visit | Attending: Radiation Oncology | Admitting: Radiation Oncology

## 2019-03-21 DIAGNOSIS — Z510 Encounter for antineoplastic radiation therapy: Secondary | ICD-10-CM | POA: Diagnosis not present

## 2019-03-21 DIAGNOSIS — C155 Malignant neoplasm of lower third of esophagus: Secondary | ICD-10-CM | POA: Diagnosis not present

## 2019-03-22 ENCOUNTER — Other Ambulatory Visit: Payer: Self-pay

## 2019-03-22 ENCOUNTER — Ambulatory Visit
Admission: RE | Admit: 2019-03-22 | Discharge: 2019-03-22 | Disposition: A | Discharge status: Home / Self Care | Payer: Medicare Other | Source: Ambulatory Visit | Attending: Radiation Oncology | Admitting: Radiation Oncology

## 2019-03-22 ENCOUNTER — Encounter: Payer: Self-pay | Admitting: Radiation Oncology

## 2019-03-22 DIAGNOSIS — C155 Malignant neoplasm of lower third of esophagus: Secondary | ICD-10-CM | POA: Diagnosis not present

## 2019-03-22 DIAGNOSIS — Z510 Encounter for antineoplastic radiation therapy: Secondary | ICD-10-CM | POA: Diagnosis not present

## 2019-03-23 ENCOUNTER — Ambulatory Visit: Payer: Medicare Other

## 2019-03-26 ENCOUNTER — Other Ambulatory Visit: Payer: Self-pay | Admitting: Hematology

## 2019-03-26 ENCOUNTER — Telehealth: Payer: Self-pay | Admitting: *Deleted

## 2019-03-26 DIAGNOSIS — I700 Atherosclerosis of aorta: Secondary | ICD-10-CM | POA: Diagnosis not present

## 2019-03-26 DIAGNOSIS — K219 Gastro-esophageal reflux disease without esophagitis: Secondary | ICD-10-CM | POA: Diagnosis not present

## 2019-03-26 DIAGNOSIS — C3490 Malignant neoplasm of unspecified part of unspecified bronchus or lung: Secondary | ICD-10-CM | POA: Diagnosis not present

## 2019-03-26 DIAGNOSIS — M84550D Pathological fracture in neoplastic disease, pelvis, subsequent encounter for fracture with routine healing: Secondary | ICD-10-CM | POA: Diagnosis not present

## 2019-03-26 DIAGNOSIS — J438 Other emphysema: Secondary | ICD-10-CM | POA: Diagnosis not present

## 2019-03-26 DIAGNOSIS — E785 Hyperlipidemia, unspecified: Secondary | ICD-10-CM | POA: Diagnosis not present

## 2019-03-26 DIAGNOSIS — I2510 Atherosclerotic heart disease of native coronary artery without angina pectoris: Secondary | ICD-10-CM | POA: Diagnosis not present

## 2019-03-26 DIAGNOSIS — G893 Neoplasm related pain (acute) (chronic): Secondary | ICD-10-CM | POA: Diagnosis not present

## 2019-03-26 DIAGNOSIS — K2270 Barrett's esophagus without dysplasia: Secondary | ICD-10-CM | POA: Diagnosis not present

## 2019-03-26 DIAGNOSIS — C160 Malignant neoplasm of cardia: Secondary | ICD-10-CM | POA: Diagnosis not present

## 2019-03-26 DIAGNOSIS — Z86711 Personal history of pulmonary embolism: Secondary | ICD-10-CM | POA: Diagnosis not present

## 2019-03-26 DIAGNOSIS — C7951 Secondary malignant neoplasm of bone: Secondary | ICD-10-CM | POA: Diagnosis not present

## 2019-03-26 DIAGNOSIS — E559 Vitamin D deficiency, unspecified: Secondary | ICD-10-CM | POA: Diagnosis not present

## 2019-03-26 DIAGNOSIS — R918 Other nonspecific abnormal finding of lung field: Secondary | ICD-10-CM | POA: Diagnosis not present

## 2019-03-26 DIAGNOSIS — I10 Essential (primary) hypertension: Secondary | ICD-10-CM | POA: Diagnosis not present

## 2019-03-26 DIAGNOSIS — Z7901 Long term (current) use of anticoagulants: Secondary | ICD-10-CM | POA: Diagnosis not present

## 2019-03-26 DIAGNOSIS — K589 Irritable bowel syndrome without diarrhea: Secondary | ICD-10-CM | POA: Diagnosis not present

## 2019-03-26 DIAGNOSIS — J432 Centrilobular emphysema: Secondary | ICD-10-CM | POA: Diagnosis not present

## 2019-03-26 NOTE — Telephone Encounter (Signed)
Patient called and requested to have lab and IVF infusion appt on 4/22 changed to later time in day. Infusion appt changed to 2pm and Lab appt requested changed to 1:30pm. Patient is aware and verbalized understanding.

## 2019-03-27 ENCOUNTER — Other Ambulatory Visit: Payer: Self-pay | Admitting: Hematology

## 2019-03-27 ENCOUNTER — Other Ambulatory Visit: Payer: Medicare Other

## 2019-03-27 ENCOUNTER — Inpatient Hospital Stay: Payer: Medicare Other

## 2019-03-27 ENCOUNTER — Ambulatory Visit: Payer: Medicare Other

## 2019-03-27 ENCOUNTER — Other Ambulatory Visit: Payer: Self-pay

## 2019-03-27 ENCOUNTER — Ambulatory Visit: Payer: Medicare Other | Admitting: Hematology

## 2019-03-27 ENCOUNTER — Telehealth: Payer: Self-pay | Admitting: *Deleted

## 2019-03-27 VITALS — BP 109/63 | HR 96 | Temp 98.1°F | Resp 18

## 2019-03-27 DIAGNOSIS — Z5111 Encounter for antineoplastic chemotherapy: Secondary | ICD-10-CM | POA: Diagnosis not present

## 2019-03-27 DIAGNOSIS — I82401 Acute embolism and thrombosis of unspecified deep veins of right lower extremity: Secondary | ICD-10-CM | POA: Diagnosis not present

## 2019-03-27 DIAGNOSIS — I2699 Other pulmonary embolism without acute cor pulmonale: Secondary | ICD-10-CM | POA: Diagnosis not present

## 2019-03-27 DIAGNOSIS — R05 Cough: Secondary | ICD-10-CM | POA: Diagnosis not present

## 2019-03-27 DIAGNOSIS — E860 Dehydration: Secondary | ICD-10-CM

## 2019-03-27 DIAGNOSIS — C774 Secondary and unspecified malignant neoplasm of inguinal and lower limb lymph nodes: Secondary | ICD-10-CM | POA: Diagnosis not present

## 2019-03-27 DIAGNOSIS — D72819 Decreased white blood cell count, unspecified: Secondary | ICD-10-CM | POA: Diagnosis not present

## 2019-03-27 DIAGNOSIS — R0602 Shortness of breath: Secondary | ICD-10-CM | POA: Diagnosis not present

## 2019-03-27 DIAGNOSIS — C3491 Malignant neoplasm of unspecified part of right bronchus or lung: Secondary | ICD-10-CM | POA: Diagnosis not present

## 2019-03-27 DIAGNOSIS — Z87891 Personal history of nicotine dependence: Secondary | ICD-10-CM | POA: Diagnosis not present

## 2019-03-27 DIAGNOSIS — R110 Nausea: Secondary | ICD-10-CM

## 2019-03-27 DIAGNOSIS — N23 Unspecified renal colic: Secondary | ICD-10-CM | POA: Diagnosis not present

## 2019-03-27 DIAGNOSIS — C9000 Multiple myeloma not having achieved remission: Secondary | ICD-10-CM

## 2019-03-27 DIAGNOSIS — C155 Malignant neoplasm of lower third of esophagus: Secondary | ICD-10-CM | POA: Diagnosis not present

## 2019-03-27 DIAGNOSIS — C7951 Secondary malignant neoplasm of bone: Secondary | ICD-10-CM | POA: Diagnosis not present

## 2019-03-27 LAB — CBC WITH DIFFERENTIAL/PLATELET
Abs Immature Granulocytes: 0.02 10*3/uL (ref 0.00–0.07)
Basophils Absolute: 0 10*3/uL (ref 0.0–0.1)
Basophils Relative: 0 %
Eosinophils Absolute: 0 10*3/uL (ref 0.0–0.5)
Eosinophils Relative: 0 %
HCT: 31.7 % — ABNORMAL LOW (ref 36.0–46.0)
Hemoglobin: 10.2 g/dL — ABNORMAL LOW (ref 12.0–15.0)
Immature Granulocytes: 1 %
Lymphocytes Relative: 6 %
Lymphs Abs: 0.2 10*3/uL — ABNORMAL LOW (ref 0.7–4.0)
MCH: 27.2 pg (ref 26.0–34.0)
MCHC: 32.2 g/dL (ref 30.0–36.0)
MCV: 84.5 fL (ref 80.0–100.0)
Monocytes Absolute: 0.4 10*3/uL (ref 0.1–1.0)
Monocytes Relative: 13 %
Neutro Abs: 2.2 10*3/uL (ref 1.7–7.7)
Neutrophils Relative %: 80 %
Platelets: 197 10*3/uL (ref 150–400)
RBC: 3.75 MIL/uL — ABNORMAL LOW (ref 3.87–5.11)
RDW: 17.7 % — ABNORMAL HIGH (ref 11.5–15.5)
WBC: 2.7 10*3/uL — ABNORMAL LOW (ref 4.0–10.5)
nRBC: 0 % (ref 0.0–0.2)

## 2019-03-27 LAB — CMP (CANCER CENTER ONLY)
ALT: 8 U/L (ref 0–44)
AST: 19 U/L (ref 15–41)
Albumin: 3 g/dL — ABNORMAL LOW (ref 3.5–5.0)
Alkaline Phosphatase: 66 U/L (ref 38–126)
Anion gap: 10 (ref 5–15)
BUN: 25 mg/dL — ABNORMAL HIGH (ref 8–23)
CO2: 22 mmol/L (ref 22–32)
Calcium: 8.4 mg/dL — ABNORMAL LOW (ref 8.9–10.3)
Chloride: 105 mmol/L (ref 98–111)
Creatinine: 1.46 mg/dL — ABNORMAL HIGH (ref 0.44–1.00)
GFR, Est AFR Am: 39 mL/min — ABNORMAL LOW (ref >60)
GFR, Estimated: 34 mL/min — ABNORMAL LOW (ref >60)
Glucose, Bld: 189 mg/dL — ABNORMAL HIGH (ref 70–99)
Potassium: 4.8 mmol/L (ref 3.5–5.1)
Sodium: 137 mmol/L (ref 135–145)
Total Bilirubin: 0.5 mg/dL (ref 0.3–1.2)
Total Protein: 7.3 g/dL (ref 6.5–8.1)

## 2019-03-27 LAB — PHOSPHORUS: Phosphorus: 1.9 mg/dL — ABNORMAL LOW (ref 2.5–4.6)

## 2019-03-27 LAB — MAGNESIUM: Magnesium: 1.9 mg/dL (ref 1.7–2.4)

## 2019-03-27 MED ORDER — PALONOSETRON HCL INJECTION 0.25 MG/5ML: 0.2500 mg | Freq: Once | INTRAVENOUS | Status: DC

## 2019-03-27 MED ORDER — SODIUM CHLORIDE 0.9 % IV SOLN: 4.0000 mg | Freq: Once | INTRAVENOUS | Status: DC

## 2019-03-27 MED ORDER — DEXAMETHASONE SODIUM PHOSPHATE 10 MG/ML IJ SOLN
INTRAMUSCULAR | Status: AC
  Filled 2019-03-27: qty 1

## 2019-03-27 MED ORDER — PALONOSETRON HCL INJECTION 0.25 MG/5ML
INTRAVENOUS | Status: AC
  Filled 2019-03-27: qty 5

## 2019-03-27 MED ORDER — SODIUM CHLORIDE 0.9 % IV SOLN
Freq: Once | INTRAVENOUS | Status: AC
  Administered 2019-03-27: 15:00:00 via INTRAVENOUS
  Filled 2019-03-27: qty 250

## 2019-03-27 MED ORDER — SODIUM CHLORIDE 0.9 % IV SOLN
INTRAVENOUS | Status: AC
  Filled 2019-03-27: qty 250

## 2019-03-27 MED ORDER — DEXAMETHASONE SODIUM PHOSPHATE 10 MG/ML IJ SOLN
4.0000 mg | Freq: Once | INTRAMUSCULAR | Status: AC
  Administered 2019-03-27: 4 mg via INTRAVENOUS

## 2019-03-27 MED ORDER — PALONOSETRON HCL INJECTION 0.25 MG/5ML
0.2500 mg | Freq: Once | INTRAVENOUS | Status: AC
  Administered 2019-03-27: 0.25 mg via INTRAVENOUS

## 2019-03-27 NOTE — Patient Instructions (Signed)
Dehydration, Adult  Dehydration is when there is not enough fluid or water in your body. This happens when you lose more fluids than you take in. Dehydration can range from mild to very bad. It should be treated right away to keep it from getting very bad. Symptoms of mild dehydration may include:  Thirst.  Dry lips.  Slightly dry mouth.  Dry, warm skin.  Dizziness. Symptoms of moderate dehydration may include:  Very dry mouth.  Muscle cramps.  Dark pee (urine). Pee may be the color of tea.  Your body making less pee.  Your eyes making fewer tears.  Heartbeat that is uneven or faster than normal (palpitations).  Headache.  Light-headedness, especially when you stand up from sitting.  Fainting (syncope). Symptoms of very bad dehydration may include:  Changes in skin, such as: ? Cold and clammy skin. ? Blotchy (mottled) or pale skin. ? Skin that does not quickly return to normal after being lightly pinched and let go (poor skin turgor).  Changes in body fluids, such as: ? Feeling very thirsty. ? Your eyes making fewer tears. ? Not sweating when body temperature is high, such as in hot weather. ? Your body making very little pee.  Changes in vital signs, such as: ? Weak pulse. ? Pulse that is more than 100 beats a minute when you are sitting still. ? Fast breathing. ? Low blood pressure.  Other changes, such as: ? Sunken eyes. ? Cold hands and feet. ? Confusion. ? Lack of energy (lethargy). ? Trouble waking up from sleep. ? Short-term weight loss. ? Unconsciousness. Follow these instructions at home:   If told by your doctor, drink an ORS: ? Make an ORS by using instructions on the package. ? Start by drinking small amounts, about  cup (120 mL) every 5-10 minutes. ? Slowly drink more until you have had the amount that your doctor said to have.  Drink enough clear fluid to keep your pee clear or pale yellow. If you were told to drink an ORS, finish the  ORS first, then start slowly drinking clear fluids. Drink fluids such as: ? Water. Do not drink only water by itself. Doing that can make the salt (sodium) level in your body get too low (hyponatremia). ? Ice chips. ? Fruit juice that you have added water to (diluted). ? Low-calorie sports drinks.  Avoid: ? Alcohol. ? Drinks that have a lot of sugar. These include high-calorie sports drinks, fruit juice that does not have water added, and soda. ? Caffeine. ? Foods that are greasy or have a lot of fat or sugar.  Take over-the-counter and prescription medicines only as told by your doctor.  Do not take salt tablets. Doing that can make the salt level in your body get too high (hypernatremia).  Eat foods that have minerals (electrolytes). Examples include bananas, oranges, potatoes, tomatoes, and spinach.  Keep all follow-up visits as told by your doctor. This is important. Contact a doctor if:  You have belly (abdominal) pain that: ? Gets worse. ? Stays in one area (localizes).  You have a rash.  You have a stiff neck.  You get angry or annoyed more easily than normal (irritability).  You are more sleepy than normal.  You have a harder time waking up than normal.  You feel: ? Weak. ? Dizzy. ? Very thirsty.  You have peed (urinated) only a small amount of very dark pee during 6-8 hours. Get help right away if:  You have   symptoms of very bad dehydration.  You cannot drink fluids without throwing up (vomiting).  Your symptoms get worse with treatment.  You have a fever.  You have a very bad headache.  You are throwing up or having watery poop (diarrhea) and it: ? Gets worse. ? Does not go away.  You have blood or something green (bile) in your throw-up.  You have blood in your poop (stool). This may cause poop to look black and tarry.  You have not peed in 6-8 hours.  You pass out (faint).  Your heart rate when you are sitting still is more than 100 beats a  minute.  You have trouble breathing. This information is not intended to replace advice given to you by your health care provider. Make sure you discuss any questions you have with your health care provider. Document Released: 09/17/2009 Document Revised: 06/10/2016 Document Reviewed: 01/15/2016 Elsevier Interactive Patient Education  2019 [employer] Inc[employer]  

## 2019-03-27 NOTE — Telephone Encounter (Signed)
Levada Dy, nurse w/[facility] visited patient yesterday for assessment. The following is her verbal report: Patient's b/p seated 104/62-hr 92, then standing b/p 82-62-hr 110. Patient dizzy/weak on standing with some dyspnea. O2 sats >95% while seated.  Patient reported recent fall due to dizziness, was instructed by [facility] nurse to make slow position changes. Patient reported poor po intake of food d/t poor appetite and nausea. [facility] nurse states she instructed pt to take ordered anti emetic 30 min prior to eating, try bland foods, emphasized high quality foods with high protein. [facility] nurse states she instructed patient to increase intake of liquids to prevent dehydration. [facility] will fax copy of orders to office for signature.

## 2019-03-28 DIAGNOSIS — I700 Atherosclerosis of aorta: Secondary | ICD-10-CM | POA: Diagnosis not present

## 2019-03-28 DIAGNOSIS — C7951 Secondary malignant neoplasm of bone: Secondary | ICD-10-CM | POA: Diagnosis not present

## 2019-03-28 DIAGNOSIS — J432 Centrilobular emphysema: Secondary | ICD-10-CM | POA: Diagnosis not present

## 2019-03-28 DIAGNOSIS — E785 Hyperlipidemia, unspecified: Secondary | ICD-10-CM | POA: Diagnosis not present

## 2019-03-28 DIAGNOSIS — R918 Other nonspecific abnormal finding of lung field: Secondary | ICD-10-CM | POA: Diagnosis not present

## 2019-03-28 DIAGNOSIS — E559 Vitamin D deficiency, unspecified: Secondary | ICD-10-CM | POA: Diagnosis not present

## 2019-03-28 DIAGNOSIS — K589 Irritable bowel syndrome without diarrhea: Secondary | ICD-10-CM | POA: Diagnosis not present

## 2019-03-28 DIAGNOSIS — Z7901 Long term (current) use of anticoagulants: Secondary | ICD-10-CM | POA: Diagnosis not present

## 2019-03-28 DIAGNOSIS — Z86711 Personal history of pulmonary embolism: Secondary | ICD-10-CM | POA: Diagnosis not present

## 2019-03-28 DIAGNOSIS — J438 Other emphysema: Secondary | ICD-10-CM | POA: Diagnosis not present

## 2019-03-28 DIAGNOSIS — I10 Essential (primary) hypertension: Secondary | ICD-10-CM | POA: Diagnosis not present

## 2019-03-28 DIAGNOSIS — K219 Gastro-esophageal reflux disease without esophagitis: Secondary | ICD-10-CM | POA: Diagnosis not present

## 2019-03-28 DIAGNOSIS — C160 Malignant neoplasm of cardia: Secondary | ICD-10-CM | POA: Diagnosis not present

## 2019-03-28 DIAGNOSIS — G893 Neoplasm related pain (acute) (chronic): Secondary | ICD-10-CM | POA: Diagnosis not present

## 2019-03-28 DIAGNOSIS — M84550D Pathological fracture in neoplastic disease, pelvis, subsequent encounter for fracture with routine healing: Secondary | ICD-10-CM | POA: Diagnosis not present

## 2019-03-28 DIAGNOSIS — K2270 Barrett's esophagus without dysplasia: Secondary | ICD-10-CM | POA: Diagnosis not present

## 2019-03-28 DIAGNOSIS — C3490 Malignant neoplasm of unspecified part of unspecified bronchus or lung: Secondary | ICD-10-CM | POA: Diagnosis not present

## 2019-03-28 DIAGNOSIS — I2510 Atherosclerotic heart disease of native coronary artery without angina pectoris: Secondary | ICD-10-CM | POA: Diagnosis not present

## 2019-03-30 ENCOUNTER — Other Ambulatory Visit: Payer: Self-pay | Admitting: Oncology

## 2019-03-30 DIAGNOSIS — K589 Irritable bowel syndrome without diarrhea: Secondary | ICD-10-CM | POA: Diagnosis not present

## 2019-03-30 DIAGNOSIS — I2510 Atherosclerotic heart disease of native coronary artery without angina pectoris: Secondary | ICD-10-CM | POA: Diagnosis not present

## 2019-03-30 DIAGNOSIS — E785 Hyperlipidemia, unspecified: Secondary | ICD-10-CM | POA: Diagnosis not present

## 2019-03-30 DIAGNOSIS — J432 Centrilobular emphysema: Secondary | ICD-10-CM | POA: Diagnosis not present

## 2019-03-30 DIAGNOSIS — R918 Other nonspecific abnormal finding of lung field: Secondary | ICD-10-CM | POA: Diagnosis not present

## 2019-03-30 DIAGNOSIS — J438 Other emphysema: Secondary | ICD-10-CM | POA: Diagnosis not present

## 2019-03-30 DIAGNOSIS — I10 Essential (primary) hypertension: Secondary | ICD-10-CM | POA: Diagnosis not present

## 2019-03-30 DIAGNOSIS — G893 Neoplasm related pain (acute) (chronic): Secondary | ICD-10-CM | POA: Diagnosis not present

## 2019-03-30 DIAGNOSIS — K219 Gastro-esophageal reflux disease without esophagitis: Secondary | ICD-10-CM | POA: Diagnosis not present

## 2019-03-30 DIAGNOSIS — K2270 Barrett's esophagus without dysplasia: Secondary | ICD-10-CM | POA: Diagnosis not present

## 2019-03-30 DIAGNOSIS — I700 Atherosclerosis of aorta: Secondary | ICD-10-CM | POA: Diagnosis not present

## 2019-03-30 DIAGNOSIS — Z7901 Long term (current) use of anticoagulants: Secondary | ICD-10-CM | POA: Diagnosis not present

## 2019-03-30 DIAGNOSIS — C7951 Secondary malignant neoplasm of bone: Secondary | ICD-10-CM | POA: Diagnosis not present

## 2019-03-30 DIAGNOSIS — C160 Malignant neoplasm of cardia: Secondary | ICD-10-CM | POA: Diagnosis not present

## 2019-03-30 DIAGNOSIS — Z86711 Personal history of pulmonary embolism: Secondary | ICD-10-CM | POA: Diagnosis not present

## 2019-03-30 DIAGNOSIS — C3490 Malignant neoplasm of unspecified part of unspecified bronchus or lung: Secondary | ICD-10-CM | POA: Diagnosis not present

## 2019-03-30 DIAGNOSIS — E559 Vitamin D deficiency, unspecified: Secondary | ICD-10-CM | POA: Diagnosis not present

## 2019-03-30 DIAGNOSIS — M84550D Pathological fracture in neoplastic disease, pelvis, subsequent encounter for fracture with routine healing: Secondary | ICD-10-CM | POA: Diagnosis not present

## 2019-04-01 ENCOUNTER — Encounter (HOSPITAL_COMMUNITY): Payer: Self-pay | Admitting: Emergency Medicine

## 2019-04-01 ENCOUNTER — Telehealth: Payer: Self-pay | Admitting: *Deleted

## 2019-04-01 ENCOUNTER — Emergency Department (HOSPITAL_COMMUNITY)
Admission: EM | Admit: 2019-04-01 | Discharge: 2019-04-01 | Disposition: A | Discharge status: Home / Self Care | Payer: Medicare Other | Attending: Emergency Medicine | Admitting: Emergency Medicine

## 2019-04-01 ENCOUNTER — Other Ambulatory Visit: Payer: Self-pay

## 2019-04-01 DIAGNOSIS — M84550D Pathological fracture in neoplastic disease, pelvis, subsequent encounter for fracture with routine healing: Secondary | ICD-10-CM | POA: Diagnosis not present

## 2019-04-01 DIAGNOSIS — C160 Malignant neoplasm of cardia: Secondary | ICD-10-CM | POA: Diagnosis not present

## 2019-04-01 DIAGNOSIS — C155 Malignant neoplasm of lower third of esophagus: Secondary | ICD-10-CM | POA: Insufficient documentation

## 2019-04-01 DIAGNOSIS — R531 Weakness: Secondary | ICD-10-CM | POA: Diagnosis not present

## 2019-04-01 DIAGNOSIS — E860 Dehydration: Secondary | ICD-10-CM | POA: Insufficient documentation

## 2019-04-01 DIAGNOSIS — R918 Other nonspecific abnormal finding of lung field: Secondary | ICD-10-CM | POA: Diagnosis not present

## 2019-04-01 DIAGNOSIS — I129 Hypertensive chronic kidney disease with stage 1 through stage 4 chronic kidney disease, or unspecified chronic kidney disease: Secondary | ICD-10-CM | POA: Diagnosis not present

## 2019-04-01 DIAGNOSIS — I2510 Atherosclerotic heart disease of native coronary artery without angina pectoris: Secondary | ICD-10-CM | POA: Diagnosis not present

## 2019-04-01 DIAGNOSIS — I700 Atherosclerosis of aorta: Secondary | ICD-10-CM | POA: Diagnosis not present

## 2019-04-01 DIAGNOSIS — R5383 Other fatigue: Secondary | ICD-10-CM | POA: Diagnosis present

## 2019-04-01 DIAGNOSIS — Z87891 Personal history of nicotine dependence: Secondary | ICD-10-CM | POA: Insufficient documentation

## 2019-04-01 DIAGNOSIS — Z79899 Other long term (current) drug therapy: Secondary | ICD-10-CM | POA: Diagnosis not present

## 2019-04-01 DIAGNOSIS — I10 Essential (primary) hypertension: Secondary | ICD-10-CM | POA: Diagnosis not present

## 2019-04-01 DIAGNOSIS — K2270 Barrett's esophagus without dysplasia: Secondary | ICD-10-CM | POA: Diagnosis not present

## 2019-04-01 DIAGNOSIS — N189 Chronic kidney disease, unspecified: Secondary | ICD-10-CM | POA: Diagnosis not present

## 2019-04-01 DIAGNOSIS — G893 Neoplasm related pain (acute) (chronic): Secondary | ICD-10-CM | POA: Diagnosis not present

## 2019-04-01 DIAGNOSIS — J438 Other emphysema: Secondary | ICD-10-CM | POA: Diagnosis not present

## 2019-04-01 DIAGNOSIS — Z7901 Long term (current) use of anticoagulants: Secondary | ICD-10-CM | POA: Diagnosis not present

## 2019-04-01 DIAGNOSIS — C7800 Secondary malignant neoplasm of unspecified lung: Secondary | ICD-10-CM | POA: Diagnosis not present

## 2019-04-01 DIAGNOSIS — K589 Irritable bowel syndrome without diarrhea: Secondary | ICD-10-CM | POA: Diagnosis not present

## 2019-04-01 DIAGNOSIS — Z91040 Latex allergy status: Secondary | ICD-10-CM | POA: Insufficient documentation

## 2019-04-01 DIAGNOSIS — K219 Gastro-esophageal reflux disease without esophagitis: Secondary | ICD-10-CM | POA: Diagnosis not present

## 2019-04-01 DIAGNOSIS — Z86711 Personal history of pulmonary embolism: Secondary | ICD-10-CM | POA: Diagnosis not present

## 2019-04-01 DIAGNOSIS — E785 Hyperlipidemia, unspecified: Secondary | ICD-10-CM | POA: Diagnosis not present

## 2019-04-01 DIAGNOSIS — C3490 Malignant neoplasm of unspecified part of unspecified bronchus or lung: Secondary | ICD-10-CM | POA: Diagnosis not present

## 2019-04-01 DIAGNOSIS — J432 Centrilobular emphysema: Secondary | ICD-10-CM | POA: Diagnosis not present

## 2019-04-01 DIAGNOSIS — C7951 Secondary malignant neoplasm of bone: Secondary | ICD-10-CM | POA: Diagnosis not present

## 2019-04-01 DIAGNOSIS — E559 Vitamin D deficiency, unspecified: Secondary | ICD-10-CM | POA: Diagnosis not present

## 2019-04-01 DIAGNOSIS — Z8501 Personal history of malignant neoplasm of esophagus: Secondary | ICD-10-CM

## 2019-04-01 LAB — COMPREHENSIVE METABOLIC PANEL
ALT: 11 U/L (ref 0–44)
AST: 23 U/L (ref 15–41)
Albumin: 2.9 g/dL — ABNORMAL LOW (ref 3.5–5.0)
Alkaline Phosphatase: 61 U/L (ref 38–126)
Anion gap: 11 (ref 5–15)
BUN: 18 mg/dL (ref 8–23)
CO2: 21 mmol/L — ABNORMAL LOW (ref 22–32)
Calcium: 8.5 mg/dL — ABNORMAL LOW (ref 8.9–10.3)
Chloride: 101 mmol/L (ref 98–111)
Creatinine, Ser: 1.18 mg/dL — ABNORMAL HIGH (ref 0.44–1.00)
GFR calc Af Amer: 51 mL/min — ABNORMAL LOW (ref >60)
GFR calc non Af Amer: 44 mL/min — ABNORMAL LOW (ref >60)
Glucose, Bld: 136 mg/dL — ABNORMAL HIGH (ref 70–99)
Potassium: 3.2 mmol/L — ABNORMAL LOW (ref 3.5–5.1)
Sodium: 133 mmol/L — ABNORMAL LOW (ref 135–145)
Total Bilirubin: 1 mg/dL (ref 0.3–1.2)
Total Protein: 6.5 g/dL (ref 6.5–8.1)

## 2019-04-01 LAB — CBC WITH DIFFERENTIAL/PLATELET
Abs Immature Granulocytes: 0.03 10*3/uL (ref 0.00–0.07)
Basophils Absolute: 0 10*3/uL (ref 0.0–0.1)
Basophils Relative: 1 %
Eosinophils Absolute: 0 10*3/uL (ref 0.0–0.5)
Eosinophils Relative: 1 %
HCT: 33.6 % — ABNORMAL LOW (ref 36.0–46.0)
Hemoglobin: 10.6 g/dL — ABNORMAL LOW (ref 12.0–15.0)
Immature Granulocytes: 1 %
Lymphocytes Relative: 23 %
Lymphs Abs: 0.8 10*3/uL (ref 0.7–4.0)
MCH: 27.2 pg (ref 26.0–34.0)
MCHC: 31.5 g/dL (ref 30.0–36.0)
MCV: 86.2 fL (ref 80.0–100.0)
Monocytes Absolute: 0.6 10*3/uL (ref 0.1–1.0)
Monocytes Relative: 17 %
Neutro Abs: 2 10*3/uL (ref 1.7–7.7)
Neutrophils Relative %: 57 %
Platelets: 129 10*3/uL — ABNORMAL LOW (ref 150–400)
RBC: 3.9 MIL/uL (ref 3.87–5.11)
RDW: 18.6 % — ABNORMAL HIGH (ref 11.5–15.5)
WBC: 3.4 10*3/uL — ABNORMAL LOW (ref 4.0–10.5)
nRBC: 0 % (ref 0.0–0.2)

## 2019-04-01 LAB — LACTIC ACID, PLASMA: Lactic Acid, Venous: 1.5 mmol/L (ref 0.5–1.9)

## 2019-04-01 MED ORDER — MEGESTROL ACETATE 40 MG PO TABS: 40.0000 mg | ORAL_TABLET | Freq: Four times a day (QID) | ORAL | 0 refills | Status: DC

## 2019-04-01 MED ORDER — HEPARIN SOD (PORK) LOCK FLUSH 100 UNIT/ML IV SOLN
500.0000 [IU] | Freq: Once | INTRAVENOUS | Status: AC
  Administered 2019-04-01: 16:00:00 500 [IU]
  Filled 2019-04-01: qty 5

## 2019-04-01 MED ORDER — ONDANSETRON HCL 4 MG/2ML IJ SOLN
4.0000 mg | Freq: Once | INTRAMUSCULAR | Status: AC
  Administered 2019-04-01: 14:00:00 4 mg via INTRAVENOUS
  Filled 2019-04-01: qty 2

## 2019-04-01 MED ORDER — SODIUM CHLORIDE 0.9 % IV BOLUS
1000.0000 mL | Freq: Once | INTRAVENOUS | Status: AC
  Administered 2019-04-01: 1000 mL via INTRAVENOUS

## 2019-04-01 NOTE — Telephone Encounter (Signed)
Levada Dy, nurse w/[facility] visited patient today for assessment. The following is her verbal report: Patient's b/p seated 86-52, hr 114, unable to obtain standing b/p. Patient dizzy/weak on standing. Patient reported to Levada Dy that urine is dark, cloudy and burning on urination. Per Levada Dy, patient was advised to go to ED over the weekend and did not go. Levada Dy has advised patient to go to ED today for fluids and evaluation of potential UTI. Patient reports poor po intake d/t nausea - Levada Dy advised her to take anti-nausea meds prophylactically during last week's visit and again today so that she can tolerate food/fluid. Levada Dy stated she was sitting with patient while giving this report. Dr. Irene Limbo updated. Asked that Levada Dy inform patient that Dr. Irene Limbo has also asked her to go to ED today. Levada Dy repeated this to patient and stated that patient has informed her she will go to ED today.

## 2019-04-01 NOTE — ED Triage Notes (Signed)
Pt was told to come to ED for IV fluids for low BP, being weak when home health RN came to see her today. Reports had her last round of chemo last week and been weak since. Reports not been eating or drinking and having dark urine.

## 2019-04-01 NOTE — Discharge Instructions (Addendum)
Begin taking Megace as prescribed today.  Continue other medications as previously prescribed.  Follow-up with your oncologist in the next week, and return to the ER if you develop abdominal pain, fevers, or other new and concerning symptoms.

## 2019-04-01 NOTE — ED Provider Notes (Signed)
[facility] DEPT Provider Note   CSN: [id number] Arrival date & time: 04/01/19  1236    History   Chief Complaint Chief Complaint  Patient presents with  . Hypotension  . Fatigue    HPI Cindy Byrd is a 78 y.o. female.     Patient is a 78 year old female with history of esophageal cancer currently undergoing radiation and chemotherapy.  She presents today for evaluation of low blood pressure and possible dehydration.  Her last chemo treatment was 1 week ago.  Since then, she has had no appetite and has not been eating or drinking very well.  She reports dark urine and weakness.  She had a low blood pressure today when home health came to visit.  Her oncologist was called and she was instructed to come here for further evaluation.  Patient denies any chest pain, difficulty breathing, abdominal pain, fever, or other complaint.  The history is provided by the patient.    Past Medical History:  Diagnosis Date  . Barrett's esophagus   . Bone neoplasm 06/24/2015  . Cancer [facility])    metastatic poorly differentiated carcinoma. tumor left groin surgical removal with radiation tx.  . Cataract    BILATERAL  . Cigarette smoker two packs a day or less    Currently still smoking 2 PPD - Not interested in quitting at this time.  . Colitis 2017  . Colon polyps    hyperplastic, tubular adenomas, tubulovillous adenoma  . Cough, persistent    hx. lung cancer ? primary-being evaluated, unsure of primary site.  . Depression 06/24/2015  . Diverticulosis   . Emphysema of lung ([place])   . Endometriosis    Hysterectomy with BSO at age 70 yrs  . Esophageal adenocarcinoma ([place]) 08/11/15   intramucosal  . Gastritis   . GERD (gastroesophageal reflux disease)   . H/O: pneumonia   . Hiatal hernia   . Hyperlipidemia   . Hypertension 06/24/2015   likely improved incidental to 40 lbs weight loss from her neoplasm. No Longer taking med for this as of 08-06-15  . IBS  (irritable bowel syndrome)   . Pain    left hip-persistent"tumor of bone"-radiation tx. 10.  . Vitamin D deficiency disease     Patient Active Problem List   Diagnosis Date Noted  . Malignant neoplasm of distal third of esophagus ([place]) 01/14/2019  . Counseling regarding advance care planning and goals of care 01/14/2019  . Abnormal finding on imaging   . Esophageal mass   . GAD (generalized anxiety disorder) 10/24/2018  . Hyperlipidemia 04/23/2018  . Chronic kidney disease 04/23/2018  . Elevated TSH 04/17/2018  . Cough 02/21/2018  . Elevated serum creatinine 02/21/2018  . Health care maintenance 01/15/2018  . Malignant neoplasm of lung ([place])   . Colitis 06/23/2017  . Left lower quadrant pain 06/23/2017  . Fracture of left iliac crest ([place]) 06/23/2017  . Metastatic lung cancer (metastasis from lung to other site), unspecified laterality ([place])   . Hypocalcemia 09/08/2016  . Vitamin D deficiency 09/08/2016  . New onset a-fib ([place]) 09/08/2016  . Chest pain   . Thrush of mouth and esophagus ([place])   . Protein-calorie malnutrition, severe ([place])   . Colitis determined by colorectal biopsy   . Bilateral pulmonary embolism ([place]) 09/07/2016  . DVT of lower extremity, bilateral ([place]) 09/07/2016  . Palliative care by specialist   . Goals of care, counseling/discussion   . Advance care planning   . Diarrhea   .  Ulcerative pancolitis without complication ([place])   . Cancer ([place])   . Pressure injury of skin 09/02/2016  . HCAP (healthcare-associated pneumonia) 09/01/2016  . ARF (acute renal failure) ([place]) 09/01/2016  . Nausea with vomiting 07/18/2016  . Dehydration 07/18/2016  . Hypokalemia 07/18/2016  . Hypoalbuminemia due to protein-calorie malnutrition ([place]) 07/18/2016  . Peripheral edema 07/18/2016  . Diarrhea due to drug 05/19/2016  . Port catheter in place 04/07/2016  . Nicotine addiction 01/19/2016  . Barrett's esophagus determined by biopsy 09/04/2015  . Gastritis   . Primary  malignant neoplasm of lung metastatic to other site ([place]) 08/06/2015  . Esophageal reflux 08/04/2015  . Bone metastasis ([place]) 06/24/2015  . Neoplasm related pain 06/24/2015  . Protein calorie malnutrition ([place]) 06/24/2015  . Anorexia 06/24/2015  . Heavy smoker (more than 20 cigarettes per day) 06/24/2015  . Depression 06/24/2015  . HTN (hypertension) 06/24/2015    Past Surgical History:  Procedure Laterality Date  . BARTHOLIN GLAND CYST EXCISION  78 yo ago   Does not want if it was an infected cyst or tumor. Was soon as delivery  . BIOPSY  01/02/2019   Procedure: BIOPSY;  Surgeon: [name], MD;  Location: [facility];  Service: Gastroenterology;;  . CATARACT EXTRACTION    . COLONOSCOPY W/ POLYPECTOMY     multiple times - last done 09/2014 per patient.  . ESOPHAGOGASTRODUODENOSCOPY (EGD) WITH PROPOFOL N/A 08/11/2015   Procedure: ESOPHAGOGASTRODUODENOSCOPY (EGD) WITH PROPOFOL;  Surgeon: [name], MD;  Location: [facility];  Service: Gastroenterology;  Laterality: N/A;  . ESOPHAGOGASTRODUODENOSCOPY (EGD) WITH PROPOFOL N/A 01/02/2019   Procedure: ESOPHAGOGASTRODUODENOSCOPY (EGD) WITH PROPOFOL;  Surgeon: [name], MD;  Location: [facility];  Service: Gastroenterology;  Laterality: N/A;  . FLEXIBLE SIGMOIDOSCOPY N/A 06/24/2017   Procedure: FLEXIBLE SIGMOIDOSCOPY;  Surgeon: Manus Gunning, MD;  Location: [facility];  Service: Gastroenterology;  Laterality: N/A;  . GANGLION CYST EXCISION    . KNEE ARTHROSCOPY  age about 7 yrs  . TONSILLECTOMY    . TOTAL ABDOMINAL HYSTERECTOMY W/ BILATERAL SALPINGOOPHORECTOMY  at age 68 yrs   For endometriosis     OB History   No obstetric history on file.      Home Medications    Prior to Admission medications   Medication Sig Start Date End Date Taking? Authorizing Provider  albuterol (PROVENTIL HFA;VENTOLIN HFA) 108 (90 Base) MCG/ACT inhaler Inhale 1 puff into the lungs every 6 (six) hours as needed for wheezing or  shortness of breath. 02/27/19   Danford, Valetta Fuller D, NP  amitriptyline (ELAVIL) 25 MG tablet Take 25 mg by mouth at bedtime.  01/15/19   [provider]  amLODipine (NORVASC) 10 MG tablet TAKE 1 TABLET BY MOUTH EVERY DAY 03/06/19   Opalski, Neoma Laming, DO  Cholecalciferol (VITAMIN D3) 25 MCG (1000 UT) CAPS Take 1,000 Units by mouth daily.    [provider]  dexamethasone (DECADRON) 4 MG tablet Take 2 tablets (8 mg total) by mouth daily. Start the day after chemotherapy for 2 days. 01/14/19   [name], MD  FLUoxetine (PROZAC) 20 MG capsule Take 1 capsule (20 mg total) by mouth daily. 12/19/18   Mina Marble D, NP  lidocaine-prilocaine (EMLA) cream Apply to affected area once 01/14/19   [name], MD  LORazepam (ATIVAN) 0.5 MG tablet Take 1 tablet (0.5 mg total) by mouth every 6 (six) hours as needed (Nausea or vomiting). 01/14/19   [name], MD  magnesium oxide (MAG-OX) 400 (241.3  Mg) MG tablet Take 1 tablet (400 mg total) by mouth daily. 03/13/19   [name], MD  naproxen sodium (ALEVE) 220 MG tablet Take 440 mg by mouth daily as needed (pain).    [provider]  omeprazole (PRILOSEC) 40 MG capsule TAKE 1 CAPSULE BY MOUTH EVERY DAY Patient taking differently: Take 40 mg by mouth 2 (two) times daily.  01/15/18   Danford, Valetta Fuller D, NP  ondansetron (ZOFRAN) 8 MG tablet Take 1 tablet (8 mg total) by mouth 2 (two) times daily as needed for refractory nausea / vomiting. Start on day 3 after chemo. 01/14/19   [name], MD  potassium chloride SA (K-DUR,KLOR-CON) 20 MEQ tablet Take 1 tablet (20 mEq total) by mouth 2 (two) times daily. 03/13/19   [name], MD  Probiotic Product (PROBIOTIC PO) Take 1 capsule by mouth 2 (two) times daily.    [provider]  prochlorperazine (COMPAZINE) 10 MG tablet Take 1 tablet (10 mg total) by mouth every 6 (six) hours as needed (Nausea or vomiting). 01/14/19   [name], MD   sucralfate (CARAFATE) 1 g tablet Take 1 tablet (1 g total) by mouth 4 (four) times daily. 02/15/19   Kyung Rudd, MD  traZODone (DESYREL) 50 MG tablet TAKE 1 TABLET BY MOUTH EVERYDAY AT BEDTIME 03/26/19   [name], MD  Vitamin D, Ergocalciferol, (DRISDOL) 1.25 MG (50000 UT) CAPS capsule Take 50,000 Units by mouth every 7 (seven) days.  01/05/19   [provider]  VITAMIN E PO Take 1 tablet by mouth daily.    [provider]  XARELTO 20 MG TABS tablet TAKE 1 TABLET DAILY WITH SUPPER. DC LOVENOX AND START XARELTO AT THE SCHEDULED TIME AS INSTRUCTED. Patient taking differently: Take 20 mg by mouth daily with supper.  11/13/18   [name], MD    Family History Family History  Problem Relation Age of Onset  . Colon cancer Brother   . Colon cancer Brother   . Stroke Mother   . Colon cancer Father   . Breast cancer Daughter 53       ER/PR+ stage II    Social History Social History   Tobacco Use  . Smoking status: Former Smoker    Packs/day: 1.00    Years: 60.00    Pack years: 60.00    Types: Cigarettes    Last attempt to quit: 12/06/2015    Years since quitting: 3.3  . Smokeless tobacco: Never Used  Substance Use Topics  . Alcohol use: No    Alcohol/week: 0.0 standard drinks  . Drug use: No     Allergies   Penicillins; Remeron [mirtazapine]; and Latex   Review of Systems Review of Systems  All other systems reviewed and are negative.    Physical Exam Updated Vital Signs BP (!) 95/59 (BP Location: Left Arm)   Pulse (!) 107   Temp 97.6 F (36.4 C) (Oral)   Resp 20   SpO2 98%   Physical Exam Vitals signs and nursing note reviewed.  Constitutional:      General: She is not in acute distress.    Appearance: She is well-developed. She is not diaphoretic.  HENT:     Head: Normocephalic and atraumatic.     Mouth/Throat:     Mouth: Mucous membranes are dry.  Neck:     Musculoskeletal: Normal range of motion and neck supple.   Cardiovascular:     Rate and Rhythm: Normal rate  and regular rhythm.     Heart sounds: No murmur. No friction rub. No gallop.   Pulmonary:     Effort: Pulmonary effort is normal. No respiratory distress.     Breath sounds: Normal breath sounds. No wheezing.  Abdominal:     General: Bowel sounds are normal. There is no distension.     Palpations: Abdomen is soft.     Tenderness: There is no abdominal tenderness.  Musculoskeletal: Normal range of motion.  Skin:    General: Skin is warm and dry.  Neurological:     General: No focal deficit present.     Mental Status: She is alert and oriented to person, place, and time.      ED Treatments / Results  Labs (all labs ordered are listed, but only abnormal results are displayed) Labs Reviewed  COMPREHENSIVE METABOLIC PANEL  CBC WITH DIFFERENTIAL/PLATELET  LACTIC ACID, PLASMA  LACTIC ACID, PLASMA  URINALYSIS, ROUTINE W REFLEX MICROSCOPIC    EKG EKG Interpretation  Date/Time:  Monday April 01 2019 12:50:22 EDT Ventricular Rate:  108 PR Interval:    QRS Duration: 88 QT Interval:  333 QTC Calculation: 447 R Axis:   45 Text Interpretation:  Sinus tachycardia Low voltage, precordial leads Confirmed by Veryl Speak [id number]) on 04/01/2019 1:13:00 PM   Radiology No results found.  Procedures Procedures (including critical care time)  Medications Ordered in ED Medications  sodium chloride 0.9 % bolus 1,000 mL (has no administration in time range)  ondansetron (ZOFRAN) injection 4 mg (has no administration in time range)     Initial Impression / Assessment and Plan / ED Course  I have reviewed the triage vital signs and the nursing notes.  Pertinent labs & imaging results that were available during my care of the patient were reviewed by me and considered in my medical decision making (see chart for details).  Patient with history of esophageal cancer presenting with decreased appetite and low blood pressure.  She has not  had much to eat or drink since her most recent chemo treatment last week.  She was found to be hypotensive by her home health nurse.  Patient feels weak, but has no other complaints.  Blood pressure was initially 95 systolic upon presentation.  She received 2 L of normal saline while in the ER and her blood pressure is now 572 systolic.  She states she feels much better.  Her laboratory studies are reassuring and I see no indication for further work-up.  Patient did inquire about being started on a medication for her appetite.  Her oncologist did mention the possibility of being prescribed Megace.  Patient will be given a prescription for this and is to follow-up with her oncologist to discuss how she is feeling in the next few days.  Final Clinical Impressions(s) / ED Diagnoses   Final diagnoses:  None    ED Discharge Orders    None       Veryl Speak, MD 04/01/19 [phone]

## 2019-04-01 NOTE — Telephone Encounter (Signed)
Contacted Carolynn Sayers, RN with [facility]. Dr. Irene Limbo plans to order IV fluids at home for patient. Per Pam, Referral Order needs to include fluid type, length of infusion time (2-4 hrs), how many times/week and duration of treatment in weeks. Msg sent to Dr. Irene Limbo.

## 2019-04-02 NOTE — Progress Notes (Signed)
HEMATOLOGY ONCOLOGY PROGRESS NOTE  Date of service:  04/03/19    Patient Care Team: [name], NP as PCP - General (Family Medicine) [name], MD as Consulting Physician (Hematology and Oncology)  CHIEF COMPLAINTS/PURPOSE OF CONSULTATION:  F/u for metastatic lung cancer  DIAGNOSIS:   #1 Metastatic non-small cell lung cancer with bilateral lung nodules and large metastatic lesion in the left Ilium. 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 Also had c diff colitis - treated   Current Treatment  1) Active surveillance 2) Xgeva 177m [place] q4weeks for bone metastases. 3) Sandostatin q4weeks for diarrhea - immune colitis  Previous Treatment  1 Palliative radiation therapy to the large left ilium metastases 2. IV Nivolumab x 20ycles (discontinued due to likely immune colitis) 3. Xgeva 1225mSC q4weeks for bone metastases.   HISTORY OF PRESENTING ILLNESS: (plz see my previous consultation for details of initial presentation)  INTERVAL HISTORY:   Cindy ToCarrerass Byrd for her scheduled follow-up for metastatic lung cancer, and adenocarcinoma of the esophagus. The patient's last visit with [place] on 03/20/19. The pt reports that she is doing well overall.  The pt reports that she is having some "weird discomfort," while urinating. She notes that she has continued to have significant SOB and dizziness while standing up and walking even a few steps. She has received a phone call from pulmonology. She notes that her inhaler hasn't helped yet. She notes that her BP has been low and went to 82 systolic upon standing. She notes that this improves some when she receives IVF. The pt continues feeling that she can breathe well while sitting, and is able to carry a conversation while sitting without becoming SOB.  She notes that she is not eating well- eating less than a fourth of what she normally eats because "it all  tastes awful." However, she is consuming 3 Ensures a day. She denies mouth sores and notes that her previous has nausea is well controlled with Zofran. She endorses some discomfort while swallowing, lower in her neck. She is sleeping better at night since she began Trazodone.  Lab results today (04/03/19) of CBC w/diff and CMP is as follows: all values are WNL except for WBC at 3.6k, RBC at 3.57, HGB at 9.4, HCT at 30.2, RDW at 18.7, PLT at 127k, Lymphs abs at 600, Potassium at 3.3, Glucose at 122, Calcium at 8.0, Total Protein at 6.0, Albumin at 2.5. 04/03/19 Phosphorous pending and Magnesium at 1.3  On review of systems, pt reports discomfort with urination, SOB and dizzziness when standing, not eating well, 3 pound weight loss, discomfort when swallowing in esophagus, and denies mouth sores, nausea, abdominal pain, leg swelling, and any other symptoms.   MEDICAL HISTORY:  Past Medical History:  Diagnosis Date  . Barrett's esophagus   . Bone neoplasm 06/24/2015  . [facility]   metastatic poorly differentiated carcinoma. tumor left groin surgical removal with radiation tx.  . Cataract    BILATERAL  . Cigarette smoker two packs a day or less    Currently still smoking 2 PPD - Not interested in quitting at this time.  . Colitis 2017  . Colon polyps    hyperplastic, tubular adenomas, tubulovillous adenoma  . Cough, persistent    hx. lung cancer ? primary-being evaluated, unsure of primary site.  . Depression 06/24/2015  . Diverticulosis   . Emphysema of lung ([facility]  .  Endometriosis    Hysterectomy with BSO at age 10 yrs  . Esophageal adenocarcinoma ([place]) 08/11/15   intramucosal  . Gastritis   . GERD (gastroesophageal reflux disease)   . H/O: pneumonia   . Hiatal hernia   . Hyperlipidemia   . Hypertension 06/24/2015   likely improved incidental to 40 lbs weight loss from her neoplasm. No Longer taking med for this as of 08-06-15  . IBS (irritable bowel syndrome)   . Pain    left  hip-persistent"tumor of bone"-radiation tx. 10.  . Vitamin D deficiency disease    SURGICAL HISTORY: Past Surgical History:  Procedure Laterality Date  . BARTHOLIN GLAND CYST EXCISION  78 yo ago   Does not want if it was an infected cyst or tumor. Was soon as delivery  . BIOPSY  01/02/2019   Procedure: BIOPSY;  Surgeon: [name], MD;  Location: [facility];  Service: Gastroenterology;;  . CATARACT EXTRACTION    . COLONOSCOPY W/ POLYPECTOMY     multiple times - last done 09/2014 per patient.  . ESOPHAGOGASTRODUODENOSCOPY (EGD) WITH PROPOFOL N/A 08/11/2015   Procedure: ESOPHAGOGASTRODUODENOSCOPY (EGD) WITH PROPOFOL;  Surgeon: [name], MD;  Location: [facility];  Service: Gastroenterology;  Laterality: N/A;  . ESOPHAGOGASTRODUODENOSCOPY (EGD) WITH PROPOFOL N/A 01/02/2019   Procedure: ESOPHAGOGASTRODUODENOSCOPY (EGD) WITH PROPOFOL;  Surgeon: [name], MD;  Location: [facility];  Service: Gastroenterology;  Laterality: N/A;  . FLEXIBLE SIGMOIDOSCOPY N/A 06/24/2017   Procedure: FLEXIBLE SIGMOIDOSCOPY;  Surgeon: Manus Gunning, MD;  Location: [facility];  Service: Gastroenterology;  Laterality: N/A;  . GANGLION CYST EXCISION    . KNEE ARTHROSCOPY  age about 41 yrs  . TONSILLECTOMY    . TOTAL ABDOMINAL HYSTERECTOMY W/ BILATERAL SALPINGOOPHORECTOMY  at age 33 yrs   For endometriosis    SOCIAL HISTORY: Social History   Socioeconomic History  . Marital status: Widowed    Spouse name: Not on file  . Number of children: 2  . Years of education: Not on file  . Highest education level: Not on file  Occupational History  . Not on file  Social Needs  . Financial resource strain: Not on file  . Food insecurity:    Worry: Not on file    Inability: Not on file  . Transportation needs:    Medical: No    Non-medical: No  Tobacco Use  . Smoking status: Former Smoker    Packs/day: 1.00    Years: 60.00    Pack years: 60.00    Types: Cigarettes    Last attempt to  quit: 12/06/2015    Years since quitting: 3.3  . Smokeless tobacco: Never Used  Substance and Sexual Activity  . Alcohol use: No    Alcohol/week: 0.0 standard drinks  . Drug use: No  . Sexual activity: Never  Lifestyle  . Physical activity:    Days per week: Not on file    Minutes per session: Not on file  . Stress: Not on file  Relationships  . Social connections:    Talks on phone: Not on file    Gets together: Not on file    Attends religious service: Not on file    Active member of club or organization: Not on file    Attends meetings of clubs or organizations: Not on file    Relationship status: Not on file  . Intimate partner violence:    Fear of current or ex partner: Not on file    Emotionally abused:  Not on file    Physically abused: Not on file    Forced sexual activity: Not on file  Other Topics Concern  . Not on file  Social History Narrative  . Not on file    FAMILY HISTORY: Family History  Problem Relation Age of Onset  . Colon cancer Brother   . Colon cancer Brother   . Stroke Mother   . Colon cancer Father   . Breast cancer Daughter 72       ER/PR+ stage II    ALLERGIES:  is allergic to penicillins; remeron [mirtazapine]; and latex. patient wonders if she has a penicillin allergy but notes that she is uncertain about this.  MEDICATIONS:  Current Outpatient Medications  Medication Sig Dispense Refill  . albuterol (PROVENTIL HFA;VENTOLIN HFA) 108 (90 Base) MCG/ACT inhaler Inhale 1 puff into the lungs every 6 (six) hours as needed for wheezing or shortness of breath. 1 Inhaler 1  . amitriptyline (ELAVIL) 25 MG tablet Take 25 mg by mouth at bedtime.     [name] amLODipine (NORVASC) 10 MG tablet TAKE 1 TABLET BY MOUTH EVERY DAY (Patient not taking: No sig reported) 90 tablet 0  . Cholecalciferol (VITAMIN D3) 25 MCG (1000 UT) CAPS Take 1,000 Units by mouth daily.    [name] dexamethasone (DECADRON) 2 MG tablet Take 1 tablet (2 mg total) by mouth daily with breakfast.  30 tablet 0  . dronabinol (MARINOL) 2.5 MG capsule Take 1 capsule (2.5 mg total) by mouth 2 (two) times daily before a meal. 60 capsule 0  . FLUoxetine (PROZAC) 20 MG capsule Take 1 capsule (20 mg total) by mouth daily. 90 capsule 3  . lidocaine-prilocaine (EMLA) cream Apply to affected area once (Patient taking differently: Apply 1 application topically as needed (port access). Apply to affected area once) 30 g 3  . LORazepam (ATIVAN) 0.5 MG tablet Take 1 tablet (0.5 mg total) by mouth every 6 (six) hours as needed (Nausea or vomiting). 30 tablet 0  . magnesium oxide (MAG-OX) 400 (241.3 Mg) MG tablet Take 1 tablet (400 mg total) by mouth daily. 60 tablet 1  . naproxen sodium (ALEVE) 220 MG tablet Take 440 mg by mouth daily as needed (pain).    . nystatin (MYCOSTATIN) 100000 UNIT/ML suspension Take 5 mLs (500,000 Units total) by mouth 4 (four) times daily. Swish and swallow 200 mL 1  . omeprazole (PRILOSEC) 40 MG capsule TAKE 1 CAPSULE BY MOUTH EVERY DAY (Patient taking differently: Take 40 mg by mouth 2 (two) times daily. ) 90 capsule 2  . ondansetron (ZOFRAN) 8 MG tablet Take 1 tablet (8 mg total) by mouth 2 (two) times daily as needed for refractory nausea / vomiting. Start on day 3 after chemo. 30 tablet 1  . potassium chloride SA (K-DUR,KLOR-CON) 20 MEQ tablet Take 1 tablet (20 mEq total) by mouth 2 (two) times daily. 60 tablet 1  . Probiotic Product (PROBIOTIC PO) Take 1 capsule by mouth 2 (two) times daily.    . prochlorperazine (COMPAZINE) 10 MG tablet Take 1 tablet (10 mg total) by mouth every 6 (six) hours as needed (Nausea or vomiting). 30 tablet 1  . sucralfate (CARAFATE) 1 g tablet Take 1 tablet (1 g total) by mouth 4 (four) times daily. 120 tablet 2  . traZODone (DESYREL) 50 MG tablet TAKE 1 TABLET BY MOUTH EVERYDAY AT BEDTIME (Patient taking differently: Take 50 mg by mouth at bedtime. ) 90 tablet 2  . Vitamin D, Ergocalciferol, (DRISDOL) 1.25 MG (50000  UT) CAPS capsule Take 50,000  Units by mouth every 7 (seven) days.     [name] VITAMIN E PO Take 1 tablet by mouth daily.    Alveda Reasons 20 MG TABS tablet TAKE 1 TABLET DAILY WITH SUPPER. DC LOVENOX AND START XARELTO AT THE SCHEDULED TIME AS INSTRUCTED. (Patient taking differently: Take 20 mg by mouth daily with supper. ) 90 tablet 1   Current Facility-Administered Medications  Medication Dose Route Frequency Provider Last Rate Last Dose  . sodium chloride 0.9 % 1,000 mL with potassium chloride 20 mEq, magnesium sulfate 2 g infusion   Intravenous Once Irene Limbo, Cloria [place], MD        REVIEW OF SYSTEMS:    A 10+ POINT REVIEW OF SYSTEMS WAS OBTAINED including neurology, dermatology, psychiatry, cardiac, respiratory, lymph, extremities, GI, GU, Musculoskeletal, constitutional, breasts, reproductive, HEENT.  All pertinent positives are noted in the HPI.  All others are negative.   PHYSICAL EXAMINATION: ECOG PERFORMANCE STATUS: 2 - Symptomatic, <50% confined to bed  Vitals:   04/03/19 1139  BP: (!) 104/58  Pulse: (!) 114  Resp: 20  Temp: 97.6 F (36.4 C)  SpO2: 100%   Filed Weights   04/03/19 1139  Weight: 136 lb 8 oz (61.9 kg)  . [name] Wt Readings from Last 3 Encounters:  04/03/19 136 lb 8 oz (61.9 kg)  03/20/19 139 lb 11.2 oz (63.4 kg)  03/13/19 141 lb 14.4 oz (64.4 kg)    GENERAL:alert, in no acute distress and comfortable SKIN: no acute rashes, no significant lesions EYES: conjunctiva are pink and non-injected, sclera anicteric OROPHARYNX: MMM, no exudates, no oropharyngeal erythema or ulceration NECK: supple, no JVD LYMPH:  no palpable lymphadenopathy in the cervical, axillary or inguinal regions LUNGS: clear to auscultation b/l with normal respiratory effort HEART: regular rate & rhythm ABDOMEN:  normoactive bowel sounds , non tender, not distended. No palpable hepatosplenomegaly.  Extremity: no pedal edema PSYCH: alert & oriented x 3 with fluent speech NEURO: no focal motor/sensory deficits   LABORATORY  DATA:  I have reviewed the data as listed  . CBC Latest Ref Rng & Units 04/03/2019 04/01/2019 03/27/2019  WBC 4.0 - 10.5 K/uL 3.6(L) 3.4(L) 2.7(L)  Hemoglobin 12.0 - 15.0 g/dL 9.4(L) 10.6(L) 10.2(L)  Hematocrit 36.0 - 46.0 % 30.2(L) 33.6(L) 31.7(L)  Platelets 150 - 400 K/uL 127(L) 129(L) 197   ANC 1.8k . CMP Latest Ref Rng & Units 04/03/2019 04/01/2019 03/27/2019  Glucose 70 - 99 mg/dL 122(H) 136(H) 189(H)  BUN 8 - 23 mg/dL 13 18 25(H)  Creatinine 0.44 - 1.00 mg/dL 0.88 1.18(H) 1.46(H)  Sodium 135 - 145 mmol/L 136 133(L) 137  Potassium 3.5 - 5.1 mmol/L 3.3(L) 3.2(L) 4.8  Chloride 98 - 111 mmol/L 107 101 105  CO2 22 - 32 mmol/L 22 21(L) 22  Calcium 8.9 - 10.3 mg/dL 8.0(L) 8.5(L) 8.4(L)  Total Protein 6.5 - 8.1 g/dL 6.0(L) 6.5 7.3  Total Bilirubin 0.3 - 1.2 mg/dL 0.6 1.0 0.5  Alkaline Phos 38 - 126 U/L 60 61 66  AST 15 - 41 U/L _0 ALT 0 - 44 U/L _1 01/02/19 Esophagus Biopsy:    RADIOGRAPHIC STUDIES:  .Dg Chest Port 1 View  Result Date: 03/06/2019 CLINICAL DATA:  History of shortness of breath and fever. EXAM: PORTABLE CHEST 1 VIEW COMPARISON:  Chest radiograph 02/25/2019. FINDINGS: Normal heart size. Clear lung fields. No bony abnormality. Unchanged Port-A-Cath. Calcified tortuous aorta. IMPRESSION: Stable chest.  No active disease. Electronically Signed   By: Staci Righter M.D.   On: 03/06/2019 15:06    ASSESSMENT & PLAN:   78 y.o. female with  #1 Metastatic poorly differentiated carcinoma with likely lung primary non-small cell lung cancer.   CT of the head with and without contrast showed no evidence of metastatic disease. EGFR blood test mutation analysis negative. CT chest abdomen pelvis 04/19/2016 shows no evidence of disease progression. Patient tolerated Nivolumab very well but was discontinued when she developed grade 2 Immune colitis. Has been off Nivolumab for >6 months  CT chest abdomen pelvis on 06/24/2016 shows no evidence of new disease or  progression of metastatic disease. CT chest abdomen pelvis 09/06/2016 shows 1. Mixed interval response to therapy. 2. There is a new left ventral chest wall lesion deep to the pectoralis musculature worrisome for metastatic disease. 3. Posterior lower lobe nodular densities are identified which may reflect areas of pulmonary metastasis. 4. Interval decrease in size of destructive lesion involving the left iliac bone.  CT chest abd pelvis 12/08/2016: Cystic mass involving the left ventral chest wall has resolved in the interval. Likely was a hematoma due to trauma. Interval increase in size of pleural base mass overlying the posterior and inferior left lower lobe. There is also a new left pleural effusion identified.  CT chest 02/01/2017: Residual irregular soft tissue thickening/volume loss and trace left pleural fluid at the base of the left hemithorax, overall improved in appearance from 12/08/2016. No measurable lesion.  CT chest 05/29/2017 shows no residual pleural based mass or significant pleural effusion in the left hemithorax. No evidence of thoracic metastatic disease. No evidence of progressive metastatic disease within the abdomen or pelvis. Mixed lytic and blastic lesion involving the left iliac bone and associated pathologic fracture are unchanged.   CT CAP 09/14/17 shows no new changes. She does have slight displacement of her fractured left iliac bone. Evidence of stable disease.   CT CAP 01/04/2018- No new or progressive metastatic disease. Stable large left iliac bone metastasis with associated chronic pathologic fracture.   CT chest/abd/pelvis done on 04/26/18 revealed Stable exam.  No new or progressive interval findings.  07/19/18 CT C/A/P revealed Stable exam.  No new or progressive interval findings. Large destructive left iliac lesion is similar to prior. Aortic Atherosclerosis and Emphysema.    11/06/18 CT C/A/P revealed Similar appearance of large mixed lytic and sclerotic lesion in  the left ilium. No new metastatic lesions are otherwise noted elsewhere in the chest, abdomen or pelvis. 2. Interval development of thickening of the distal third of the esophagus. This is nonspecific, and could be related to underlying reflux esophagitis. However, if there is any clinical concern for Barrett's metaplasia or esophageal neoplasia, further evaluation with nonemergent endoscopy could be considered. 3. Aortic atherosclerosis, in addition to left main coronary artery disease. Assessment for potential risk factor modification, dietary therapy or pharmacologic therapy may be warranted, if clinically Indicated. 4. Diffuse bronchial wall thickening with mild to moderate centrilobular and paraseptal emphysema; imaging findings suggestive of underlying COPD. 5. Additional incidental findings, as above.   #2 Newly diagnosed Adenocarcinoma of the esophagus  Barrett's esophagus 4cms in the distal esophagus with low and high-grade dysplasia  01/02/19 Surgical pathology revealed adenocarcinoma of the esophagus   01/25/19 PET/CT revealed Distal esophageal primary, without hypermetabolic metastatic disease. 2. Chronic left iliac metastasis, as before. 3. Hypermetabolism within and superficial to the right gluteal musculature is most likely related to trauma and/or injection sites.  4. Aortic atherosclerosis, coronary artery atherosclerosis and emphysema.  S/p concurrent Carboplatin and Taxol weekly with RT of 45 Gy in 25 fractions and 5.4 Gy boost, completed between 02/04/19 and 03/27/19  #3 diarrhea-  now resolved was previously. 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 She was previously on on Lialda, budesonide,probiotics and lomotil but not currently taking any of these. Plan -Continue Sandostatin every 4 weeks   #4 h/o diverticulitis and c diff colitis - now resolved Plan   -continue on sandostatin today and q4weeks.  -continue on Lialda  #5 DVT and PE  -continue on Xarelto - no issues with bleeding   #6 Dsypnea 03/14/19 ECHO revealed LV EF of 60-65% 03/06/19 CXR revealed clear lung fields, normal heart size 03/07/19 and 02/25/19 EKGs, no overt concern but some decreased QRS amplitude Did refer to pulmonology for further evaluation and lung function testing  Began steroid inhaler to mitigate possible inflammation in airway, could be some radiation related scarring and emphysema  PLAN: -Discussed pt labwork today, 04/03/19; HGB slightly lower at 9.4, Magnesium low at 1.3. Potassium low at 3.3 -Proceed with IVF and will set pt up for IVF at home 3 times per week -Will order urine study today given discomfort with urination -Discussed Marinol for appetite stimulation. Pt prefers to begin Marinol. -Low dose dexamethasone as well -Begin incentive spirometer -Fluconazole for suspected esophageal trush -Continued to stress the importance of consuming food and maintaining her nutrition, staying active, staying hydrated, and resting well -Will continue monthly Sandostatin -Hold Amlodipine, BP at 109/56 in clinic today and pt is eating less -Follow up with home health services -PO Magnesium and PO Potassium replacement -Continue with Xgeva every 4 weeks, no dental concerns at this time -Continue 2000 units Vitamin D every day, with dose adjustment to maintain 25OH Vit D levels of 40-60 -Trazodone for sleep difficulty -Will see the pt back in 2 weeks   RTC with Dr Irene Limbo with labs and IVF in 2 weeks Home care IVF MWF for 4 weeks Continue Xgeva and sandostatin q4weeks   All questions were answered. The patient knows to call the clinic with any problems, questions or concerns.  The total time spent in the appt was 25 minutes and more than 50% was on counseling and direct patient cares.   Sullivan Lone MD [place] Hematology/Oncology Physician [facility]  (Office): [phone] (Work cell): [phone] (Fax): [phone]  I, Baldwin [place], am acting as a scribe for Dr. Sullivan Lone.   .I have reviewed the above documentation for accuracy and completeness, and I agree with the above. [name] MD

## 2019-04-03 ENCOUNTER — Inpatient Hospital Stay: Payer: Medicare Other

## 2019-04-03 ENCOUNTER — Inpatient Hospital Stay (HOSPITAL_BASED_OUTPATIENT_CLINIC_OR_DEPARTMENT_OTHER): Payer: Medicare Other | Admitting: Hematology

## 2019-04-03 ENCOUNTER — Other Ambulatory Visit: Payer: Self-pay

## 2019-04-03 ENCOUNTER — Ambulatory Visit: Payer: Medicare Other

## 2019-04-03 ENCOUNTER — Other Ambulatory Visit: Payer: Medicare Other

## 2019-04-03 ENCOUNTER — Telehealth: Payer: Self-pay | Admitting: Hematology

## 2019-04-03 VITALS — BP 104/58 | HR 114 | Temp 97.6°F | Resp 20 | Ht 68.5 in | Wt 136.5 lb

## 2019-04-03 DIAGNOSIS — E876 Hypokalemia: Secondary | ICD-10-CM

## 2019-04-03 DIAGNOSIS — N23 Unspecified renal colic: Secondary | ICD-10-CM | POA: Diagnosis not present

## 2019-04-03 DIAGNOSIS — K529 Noninfective gastroenteritis and colitis, unspecified: Secondary | ICD-10-CM

## 2019-04-03 DIAGNOSIS — I2699 Other pulmonary embolism without acute cor pulmonale: Secondary | ICD-10-CM | POA: Diagnosis not present

## 2019-04-03 DIAGNOSIS — Z87891 Personal history of nicotine dependence: Secondary | ICD-10-CM | POA: Diagnosis not present

## 2019-04-03 DIAGNOSIS — R05 Cough: Secondary | ICD-10-CM | POA: Diagnosis not present

## 2019-04-03 DIAGNOSIS — C7951 Secondary malignant neoplasm of bone: Secondary | ICD-10-CM

## 2019-04-03 DIAGNOSIS — Z7189 Other specified counseling: Secondary | ICD-10-CM

## 2019-04-03 DIAGNOSIS — C774 Secondary and unspecified malignant neoplasm of inguinal and lower limb lymph nodes: Secondary | ICD-10-CM | POA: Diagnosis not present

## 2019-04-03 DIAGNOSIS — C155 Malignant neoplasm of lower third of esophagus: Secondary | ICD-10-CM

## 2019-04-03 DIAGNOSIS — R197 Diarrhea, unspecified: Secondary | ICD-10-CM

## 2019-04-03 DIAGNOSIS — Z95828 Presence of other vascular implants and grafts: Secondary | ICD-10-CM

## 2019-04-03 DIAGNOSIS — C3490 Malignant neoplasm of unspecified part of unspecified bronchus or lung: Secondary | ICD-10-CM

## 2019-04-03 DIAGNOSIS — R42 Dizziness and giddiness: Secondary | ICD-10-CM

## 2019-04-03 DIAGNOSIS — E860 Dehydration: Secondary | ICD-10-CM

## 2019-04-03 DIAGNOSIS — I82401 Acute embolism and thrombosis of unspecified deep veins of right lower extremity: Secondary | ICD-10-CM | POA: Diagnosis not present

## 2019-04-03 DIAGNOSIS — R0602 Shortness of breath: Secondary | ICD-10-CM | POA: Diagnosis not present

## 2019-04-03 DIAGNOSIS — Z5111 Encounter for antineoplastic chemotherapy: Secondary | ICD-10-CM | POA: Diagnosis not present

## 2019-04-03 DIAGNOSIS — K521 Toxic gastroenteritis and colitis: Secondary | ICD-10-CM

## 2019-04-03 DIAGNOSIS — C3491 Malignant neoplasm of unspecified part of right bronchus or lung: Secondary | ICD-10-CM | POA: Diagnosis not present

## 2019-04-03 DIAGNOSIS — R634 Abnormal weight loss: Secondary | ICD-10-CM

## 2019-04-03 DIAGNOSIS — D72819 Decreased white blood cell count, unspecified: Secondary | ICD-10-CM | POA: Diagnosis not present

## 2019-04-03 DIAGNOSIS — R1310 Dysphagia, unspecified: Secondary | ICD-10-CM

## 2019-04-03 LAB — URINALYSIS, COMPLETE (UACMP) WITH MICROSCOPIC
Bilirubin Urine: NEGATIVE
Glucose, UA: NEGATIVE mg/dL
Ketones, ur: 5 mg/dL — AB
Nitrite: POSITIVE — AB
Protein, ur: 30 mg/dL — AB
Specific Gravity, Urine: 1.019 (ref 1.005–1.030)
pH: 5 (ref 5.0–8.0)

## 2019-04-03 LAB — CBC WITH DIFFERENTIAL/PLATELET
Abs Immature Granulocytes: 0.04 10*3/uL (ref 0.00–0.07)
Basophils Absolute: 0 10*3/uL (ref 0.0–0.1)
Basophils Relative: 1 %
Eosinophils Absolute: 0 10*3/uL (ref 0.0–0.5)
Eosinophils Relative: 1 %
HCT: 30.2 % — ABNORMAL LOW (ref 36.0–46.0)
Hemoglobin: 9.4 g/dL — ABNORMAL LOW (ref 12.0–15.0)
Immature Granulocytes: 1 %
Lymphocytes Relative: 16 %
Lymphs Abs: 0.6 10*3/uL — ABNORMAL LOW (ref 0.7–4.0)
MCH: 26.3 pg (ref 26.0–34.0)
MCHC: 31.1 g/dL (ref 30.0–36.0)
MCV: 84.6 fL (ref 80.0–100.0)
Monocytes Absolute: 0.4 10*3/uL (ref 0.1–1.0)
Monocytes Relative: 11 %
Neutro Abs: 2.5 10*3/uL (ref 1.7–7.7)
Neutrophils Relative %: 70 %
Platelets: 127 10*3/uL — ABNORMAL LOW (ref 150–400)
RBC: 3.57 MIL/uL — ABNORMAL LOW (ref 3.87–5.11)
RDW: 18.7 % — ABNORMAL HIGH (ref 11.5–15.5)
WBC: 3.6 10*3/uL — ABNORMAL LOW (ref 4.0–10.5)
nRBC: 0 % (ref 0.0–0.2)

## 2019-04-03 LAB — CMP (CANCER CENTER ONLY)
ALT: 8 U/L (ref 0–44)
AST: 23 U/L (ref 15–41)
Albumin: 2.5 g/dL — ABNORMAL LOW (ref 3.5–5.0)
Alkaline Phosphatase: 60 U/L (ref 38–126)
Anion gap: 7 (ref 5–15)
BUN: 13 mg/dL (ref 8–23)
CO2: 22 mmol/L (ref 22–32)
Calcium: 8 mg/dL — ABNORMAL LOW (ref 8.9–10.3)
Chloride: 107 mmol/L (ref 98–111)
Creatinine: 0.88 mg/dL (ref 0.44–1.00)
GFR, Est AFR Am: >60 mL/min (ref >60)
GFR, Estimated: >60 mL/min (ref >60)
Glucose, Bld: 122 mg/dL — ABNORMAL HIGH (ref 70–99)
Potassium: 3.3 mmol/L — ABNORMAL LOW (ref 3.5–5.1)
Sodium: 136 mmol/L (ref 135–145)
Total Bilirubin: 0.6 mg/dL (ref 0.3–1.2)
Total Protein: 6 g/dL — ABNORMAL LOW (ref 6.5–8.1)

## 2019-04-03 LAB — PHOSPHORUS: Phosphorus: 2.5 mg/dL (ref 2.5–4.6)

## 2019-04-03 LAB — MAGNESIUM: Magnesium: 1.3 mg/dL — CL (ref 1.7–2.4)

## 2019-04-03 MED ORDER — MAGNESIUM SULFATE 50 % IJ SOLN: 2000.0000 mg | Freq: Once | INTRAVENOUS | Status: DC

## 2019-04-03 MED ORDER — NYSTATIN 100000 UNIT/ML MT SUSP: 5.0000 mL | Freq: Four times a day (QID) | OROMUCOSAL | 1 refills | Status: DC

## 2019-04-03 MED ORDER — DRONABINOL 2.5 MG PO CAPS: 2.5000 mg | ORAL_CAPSULE | Freq: Two times a day (BID) | ORAL | 0 refills | Status: DC

## 2019-04-03 MED ORDER — SODIUM CHLORIDE 0.9 % IV SOLN
Freq: Once | INTRAVENOUS | Status: AC
  Administered 2019-04-03: 13:00:00 via INTRAVENOUS
  Filled 2019-04-03: qty 10

## 2019-04-03 MED ORDER — SODIUM CHLORIDE 0.9% FLUSH
10.0000 mL | Freq: Once | INTRAVENOUS | Status: AC
  Administered 2019-04-03: 15:00:00 10 mL
  Filled 2019-04-03: qty 10

## 2019-04-03 MED ORDER — SODIUM CHLORIDE 0.9 % IV SOLN
Freq: Once | INTRAVENOUS | Status: DC
  Filled 2019-04-03: qty 10

## 2019-04-03 MED ORDER — HEPARIN SOD (PORK) LOCK FLUSH 100 UNIT/ML IV SOLN
500.0000 [IU] | Freq: Once | INTRAVENOUS | Status: AC
  Administered 2019-04-03: 15:00:00 500 [IU]
  Filled 2019-04-03: qty 5

## 2019-04-03 MED ORDER — SODIUM CHLORIDE 0.9 % IV SOLN: Freq: Once | INTRAVENOUS | Status: DC

## 2019-04-03 MED ORDER — DEXAMETHASONE 2 MG PO TABS: 2.0000 mg | ORAL_TABLET | Freq: Every day | ORAL | 0 refills | Status: DC

## 2019-04-03 MED ORDER — SODIUM CHLORIDE 0.9% FLUSH
10.0000 mL | Freq: Once | INTRAVENOUS | Status: AC
  Administered 2019-04-03: 10 mL
  Filled 2019-04-03: qty 10

## 2019-04-03 NOTE — Telephone Encounter (Signed)
Per 4/29 los, added a MD visit.

## 2019-04-03 NOTE — Patient Instructions (Signed)
Dehydration, Adult  Dehydration is when there is not enough fluid or water in your body. This happens when you lose more fluids than you take in. Dehydration can range from mild to very bad. It should be treated right away to keep it from getting very bad. Symptoms of mild dehydration may include:  Thirst.  Dry lips.  Slightly dry mouth.  Dry, warm skin.  Dizziness. Symptoms of moderate dehydration may include:  Very dry mouth.  Muscle cramps.  Dark pee (urine). Pee may be the color of tea.  Your body making less pee.  Your eyes making fewer tears.  Heartbeat that is uneven or faster than normal (palpitations).  Headache.  Light-headedness, especially when you stand up from sitting.  Fainting (syncope). Symptoms of very bad dehydration may include:  Changes in skin, such as: ? Cold and clammy skin. ? Blotchy (mottled) or pale skin. ? Skin that does not quickly return to normal after being lightly pinched and let go (poor skin turgor).  Changes in body fluids, such as: ? Feeling very thirsty. ? Your eyes making fewer tears. ? Not sweating when body temperature is high, such as in hot weather. ? Your body making very little pee.  Changes in vital signs, such as: ? Weak pulse. ? Pulse that is more than 100 beats a minute when you are sitting still. ? Fast breathing. ? Low blood pressure.  Other changes, such as: ? Sunken eyes. ? Cold hands and feet. ? Confusion. ? Lack of energy (lethargy). ? Trouble waking up from sleep. ? Short-term weight loss. ? Unconsciousness. Follow these instructions at home:   If told by your doctor, drink an ORS: ? Make an ORS by using instructions on the package. ? Start by drinking small amounts, about  cup (120 mL) every 5-10 minutes. ? Slowly drink more until you have had the amount that your doctor said to have.  Drink enough clear fluid to keep your pee clear or pale yellow. If you were told to drink an ORS, finish the  ORS first, then start slowly drinking clear fluids. Drink fluids such as: ? Water. Do not drink only water by itself. Doing that can make the salt (sodium) level in your body get too low (hyponatremia). ? Ice chips. ? Fruit juice that you have added water to (diluted). ? Low-calorie sports drinks.  Avoid: ? Alcohol. ? Drinks that have a lot of sugar. These include high-calorie sports drinks, fruit juice that does not have water added, and soda. ? Caffeine. ? Foods that are greasy or have a lot of fat or sugar.  Take over-the-counter and prescription medicines only as told by your doctor.  Do not take salt tablets. Doing that can make the salt level in your body get too high (hypernatremia).  Eat foods that have minerals (electrolytes). Examples include bananas, oranges, potatoes, tomatoes, and spinach.  Keep all follow-up visits as told by your doctor. This is important. Contact a doctor if:  You have belly (abdominal) pain that: ? Gets worse. ? Stays in one area (localizes).  You have a rash.  You have a stiff neck.  You get angry or annoyed more easily than normal (irritability).  You are more sleepy than normal.  You have a harder time waking up than normal.  You feel: ? Weak. ? Dizzy. ? Very thirsty.  You have peed (urinated) only a small amount of very dark pee during 6-8 hours. Get help right away if:  You have   symptoms of very bad dehydration.  You cannot drink fluids without throwing up (vomiting).  Your symptoms get worse with treatment.  You have a fever.  You have a very bad headache.  You are throwing up or having watery poop (diarrhea) and it: ? Gets worse. ? Does not go away.  You have blood or something green (bile) in your throw-up.  You have blood in your poop (stool). This may cause poop to look black and tarry.  You have not peed in 6-8 hours.  You pass out (faint).  Your heart rate when you are sitting still is more than 100 beats a  minute.  You have trouble breathing. This information is not intended to replace advice given to you by your health care provider. Make sure you discuss any questions you have with your health care provider. Document Released: 09/17/2009 Document Revised: 06/10/2016 Document Reviewed: 01/15/2016 Elsevier Interactive Patient Education  2019 [employer] Inc[employer]  

## 2019-04-04 ENCOUNTER — Other Ambulatory Visit: Payer: Self-pay | Admitting: Hematology

## 2019-04-04 DIAGNOSIS — E876 Hypokalemia: Secondary | ICD-10-CM

## 2019-04-04 DIAGNOSIS — K521 Toxic gastroenteritis and colitis: Secondary | ICD-10-CM

## 2019-04-04 DIAGNOSIS — C155 Malignant neoplasm of lower third of esophagus: Secondary | ICD-10-CM

## 2019-04-04 DIAGNOSIS — C3490 Malignant neoplasm of unspecified part of unspecified bronchus or lung: Secondary | ICD-10-CM

## 2019-04-04 NOTE — Telephone Encounter (Signed)
K+ = 3.3 on 04-03-2019.

## 2019-04-05 ENCOUNTER — Telehealth: Payer: Self-pay | Admitting: *Deleted

## 2019-04-05 DIAGNOSIS — E785 Hyperlipidemia, unspecified: Secondary | ICD-10-CM | POA: Diagnosis not present

## 2019-04-05 DIAGNOSIS — G893 Neoplasm related pain (acute) (chronic): Secondary | ICD-10-CM | POA: Diagnosis not present

## 2019-04-05 DIAGNOSIS — M84550D Pathological fracture in neoplastic disease, pelvis, subsequent encounter for fracture with routine healing: Secondary | ICD-10-CM | POA: Diagnosis not present

## 2019-04-05 DIAGNOSIS — C7951 Secondary malignant neoplasm of bone: Secondary | ICD-10-CM | POA: Diagnosis not present

## 2019-04-05 DIAGNOSIS — J432 Centrilobular emphysema: Secondary | ICD-10-CM | POA: Diagnosis not present

## 2019-04-05 DIAGNOSIS — I2510 Atherosclerotic heart disease of native coronary artery without angina pectoris: Secondary | ICD-10-CM | POA: Diagnosis not present

## 2019-04-05 DIAGNOSIS — I700 Atherosclerosis of aorta: Secondary | ICD-10-CM | POA: Diagnosis not present

## 2019-04-05 DIAGNOSIS — I10 Essential (primary) hypertension: Secondary | ICD-10-CM | POA: Diagnosis not present

## 2019-04-05 DIAGNOSIS — C160 Malignant neoplasm of cardia: Secondary | ICD-10-CM | POA: Diagnosis not present

## 2019-04-05 DIAGNOSIS — K2270 Barrett's esophagus without dysplasia: Secondary | ICD-10-CM | POA: Diagnosis not present

## 2019-04-05 DIAGNOSIS — Z86711 Personal history of pulmonary embolism: Secondary | ICD-10-CM | POA: Diagnosis not present

## 2019-04-05 DIAGNOSIS — E559 Vitamin D deficiency, unspecified: Secondary | ICD-10-CM | POA: Diagnosis not present

## 2019-04-05 DIAGNOSIS — R918 Other nonspecific abnormal finding of lung field: Secondary | ICD-10-CM | POA: Diagnosis not present

## 2019-04-05 DIAGNOSIS — C3490 Malignant neoplasm of unspecified part of unspecified bronchus or lung: Secondary | ICD-10-CM | POA: Diagnosis not present

## 2019-04-05 DIAGNOSIS — Z7901 Long term (current) use of anticoagulants: Secondary | ICD-10-CM | POA: Diagnosis not present

## 2019-04-05 DIAGNOSIS — K219 Gastro-esophageal reflux disease without esophagitis: Secondary | ICD-10-CM | POA: Diagnosis not present

## 2019-04-05 DIAGNOSIS — J438 Other emphysema: Secondary | ICD-10-CM | POA: Diagnosis not present

## 2019-04-05 DIAGNOSIS — C3480 Malignant neoplasm of overlapping sites of unspecified bronchus and lung: Secondary | ICD-10-CM | POA: Diagnosis not present

## 2019-04-05 DIAGNOSIS — K589 Irritable bowel syndrome without diarrhea: Secondary | ICD-10-CM | POA: Diagnosis not present

## 2019-04-05 DIAGNOSIS — E860 Dehydration: Secondary | ICD-10-CM | POA: Diagnosis not present

## 2019-04-05 LAB — URINE CULTURE: Culture: >=100000 — AB

## 2019-04-05 NOTE — Telephone Encounter (Signed)
Faxed MD signed orders to [facility] for home IVF and port flush [phone]. Fax confirmation received. Faxed MD signed orders to [place] for rolling walker [phone]. Fax confirmation received.

## 2019-04-08 DIAGNOSIS — Z86711 Personal history of pulmonary embolism: Secondary | ICD-10-CM | POA: Diagnosis not present

## 2019-04-08 DIAGNOSIS — E785 Hyperlipidemia, unspecified: Secondary | ICD-10-CM | POA: Diagnosis not present

## 2019-04-08 DIAGNOSIS — I700 Atherosclerosis of aorta: Secondary | ICD-10-CM | POA: Diagnosis not present

## 2019-04-08 DIAGNOSIS — J432 Centrilobular emphysema: Secondary | ICD-10-CM | POA: Diagnosis not present

## 2019-04-08 DIAGNOSIS — C160 Malignant neoplasm of cardia: Secondary | ICD-10-CM | POA: Diagnosis not present

## 2019-04-08 DIAGNOSIS — K589 Irritable bowel syndrome without diarrhea: Secondary | ICD-10-CM | POA: Diagnosis not present

## 2019-04-08 DIAGNOSIS — G893 Neoplasm related pain (acute) (chronic): Secondary | ICD-10-CM | POA: Diagnosis not present

## 2019-04-08 DIAGNOSIS — C3490 Malignant neoplasm of unspecified part of unspecified bronchus or lung: Secondary | ICD-10-CM | POA: Diagnosis not present

## 2019-04-08 DIAGNOSIS — R918 Other nonspecific abnormal finding of lung field: Secondary | ICD-10-CM | POA: Diagnosis not present

## 2019-04-08 DIAGNOSIS — J438 Other emphysema: Secondary | ICD-10-CM | POA: Diagnosis not present

## 2019-04-08 DIAGNOSIS — I10 Essential (primary) hypertension: Secondary | ICD-10-CM | POA: Diagnosis not present

## 2019-04-08 DIAGNOSIS — Z7901 Long term (current) use of anticoagulants: Secondary | ICD-10-CM | POA: Diagnosis not present

## 2019-04-08 DIAGNOSIS — C7951 Secondary malignant neoplasm of bone: Secondary | ICD-10-CM | POA: Diagnosis not present

## 2019-04-08 DIAGNOSIS — K219 Gastro-esophageal reflux disease without esophagitis: Secondary | ICD-10-CM | POA: Diagnosis not present

## 2019-04-08 DIAGNOSIS — M84550D Pathological fracture in neoplastic disease, pelvis, subsequent encounter for fracture with routine healing: Secondary | ICD-10-CM | POA: Diagnosis not present

## 2019-04-08 DIAGNOSIS — I2510 Atherosclerotic heart disease of native coronary artery without angina pectoris: Secondary | ICD-10-CM | POA: Diagnosis not present

## 2019-04-08 DIAGNOSIS — K2270 Barrett's esophagus without dysplasia: Secondary | ICD-10-CM | POA: Diagnosis not present

## 2019-04-08 DIAGNOSIS — E559 Vitamin D deficiency, unspecified: Secondary | ICD-10-CM | POA: Diagnosis not present

## 2019-04-08 MED ORDER — LEVOFLOXACIN 500 MG PO TABS: 500.0000 mg | ORAL_TABLET | Freq: Every day | ORAL | 0 refills | Status: DC

## 2019-04-09 ENCOUNTER — Telehealth: Payer: Self-pay | Admitting: *Deleted

## 2019-04-09 ENCOUNTER — Telehealth: Payer: Self-pay

## 2019-04-09 DIAGNOSIS — Z9181 History of falling: Secondary | ICD-10-CM | POA: Diagnosis not present

## 2019-04-09 DIAGNOSIS — R269 Unspecified abnormalities of gait and mobility: Secondary | ICD-10-CM | POA: Diagnosis not present

## 2019-04-09 NOTE — Telephone Encounter (Signed)
Nutrition  Patient identified on Malnutrition Screening report for weight loss and poor appetite.  Chart reviewed.    Called patient via phone to introduce self and service at [facility].  Patient reports that she does not think RD can help her at this time.  "I just got to eat more."  Offered RD's contact information and patient reported that she would ask someone at the cancer center to get in touch with RD in the future if needed.    Emmanuela Ghazi B. Zenia Resides, [place], [place] Registered Dietitian [phone] (pager)

## 2019-04-09 NOTE — Telephone Encounter (Signed)
Contacted patient regarding test results per Dr. Grier Mitts directions: Please let patient know she has a Klebsiella UTI. Antibiotics (levofloxacin) sent to her pharmacy.  Patient verbalized understanding and stated she had picked up antibiotic. Patient states she will have her IV fluids at [facility] as scheduled tomorrow/Wednesday instead of at home since she would be here for lab work and injections.  Notified Levada Dy, RN w/ [facility] [phone] of patient plan to have IV fluid infusion at [facility] on Wednesday instead of at home.

## 2019-04-10 ENCOUNTER — Other Ambulatory Visit: Payer: Self-pay

## 2019-04-10 ENCOUNTER — Inpatient Hospital Stay: Payer: Medicare Other | Attending: Hematology

## 2019-04-10 ENCOUNTER — Telehealth: Payer: Self-pay | Admitting: *Deleted

## 2019-04-10 ENCOUNTER — Inpatient Hospital Stay: Payer: Medicare Other

## 2019-04-10 VITALS — BP 136/76 | HR 88 | Temp 97.5°F | Resp 19

## 2019-04-10 DIAGNOSIS — C3490 Malignant neoplasm of unspecified part of unspecified bronchus or lung: Secondary | ICD-10-CM

## 2019-04-10 DIAGNOSIS — C155 Malignant neoplasm of lower third of esophagus: Secondary | ICD-10-CM

## 2019-04-10 DIAGNOSIS — C774 Secondary and unspecified malignant neoplasm of inguinal and lower limb lymph nodes: Secondary | ICD-10-CM | POA: Insufficient documentation

## 2019-04-10 DIAGNOSIS — K521 Toxic gastroenteritis and colitis: Secondary | ICD-10-CM

## 2019-04-10 DIAGNOSIS — C7951 Secondary malignant neoplasm of bone: Secondary | ICD-10-CM | POA: Diagnosis not present

## 2019-04-10 DIAGNOSIS — C3491 Malignant neoplasm of unspecified part of right bronchus or lung: Secondary | ICD-10-CM | POA: Diagnosis not present

## 2019-04-10 DIAGNOSIS — Z7189 Other specified counseling: Secondary | ICD-10-CM

## 2019-04-10 DIAGNOSIS — K529 Noninfective gastroenteritis and colitis, unspecified: Secondary | ICD-10-CM

## 2019-04-10 DIAGNOSIS — Z95828 Presence of other vascular implants and grafts: Secondary | ICD-10-CM

## 2019-04-10 DIAGNOSIS — R197 Diarrhea, unspecified: Secondary | ICD-10-CM

## 2019-04-10 LAB — CMP (CANCER CENTER ONLY)
ALT: 8 U/L (ref 0–44)
AST: 21 U/L (ref 15–41)
Albumin: 2.4 g/dL — ABNORMAL LOW (ref 3.5–5.0)
Alkaline Phosphatase: 75 U/L (ref 38–126)
Anion gap: 9 (ref 5–15)
BUN: 15 mg/dL (ref 8–23)
CO2: 19 mmol/L — ABNORMAL LOW (ref 22–32)
Calcium: 7.2 mg/dL — ABNORMAL LOW (ref 8.9–10.3)
Chloride: 112 mmol/L — ABNORMAL HIGH (ref 98–111)
Creatinine: 0.97 mg/dL (ref 0.44–1.00)
GFR, Est AFR Am: >60 mL/min (ref >60)
GFR, Estimated: 56 mL/min — ABNORMAL LOW (ref >60)
Glucose, Bld: 149 mg/dL — ABNORMAL HIGH (ref 70–99)
Potassium: 3.8 mmol/L (ref 3.5–5.1)
Sodium: 140 mmol/L (ref 135–145)
Total Bilirubin: 0.5 mg/dL (ref 0.3–1.2)
Total Protein: 6.1 g/dL — ABNORMAL LOW (ref 6.5–8.1)

## 2019-04-10 LAB — CBC WITH DIFFERENTIAL/PLATELET
Abs Immature Granulocytes: 0.03 10*3/uL (ref 0.00–0.07)
Basophils Absolute: 0 10*3/uL (ref 0.0–0.1)
Basophils Relative: 0 %
Eosinophils Absolute: 0 10*3/uL (ref 0.0–0.5)
Eosinophils Relative: 0 %
HCT: 27.9 % — ABNORMAL LOW (ref 36.0–46.0)
Hemoglobin: 8.7 g/dL — ABNORMAL LOW (ref 12.0–15.0)
Immature Granulocytes: 1 %
Lymphocytes Relative: 18 %
Lymphs Abs: 0.7 10*3/uL (ref 0.7–4.0)
MCH: 26.7 pg (ref 26.0–34.0)
MCHC: 31.2 g/dL (ref 30.0–36.0)
MCV: 85.6 fL (ref 80.0–100.0)
Monocytes Absolute: 0.3 10*3/uL (ref 0.1–1.0)
Monocytes Relative: 9 %
Neutro Abs: 2.8 10*3/uL (ref 1.7–7.7)
Neutrophils Relative %: 72 %
Platelets: 169 10*3/uL (ref 150–400)
RBC: 3.26 MIL/uL — ABNORMAL LOW (ref 3.87–5.11)
RDW: 20.5 % — ABNORMAL HIGH (ref 11.5–15.5)
WBC: 3.9 10*3/uL — ABNORMAL LOW (ref 4.0–10.5)
nRBC: 0 % (ref 0.0–0.2)

## 2019-04-10 LAB — MAGNESIUM: Magnesium: 1.4 mg/dL — CL (ref 1.7–2.4)

## 2019-04-10 LAB — PHOSPHORUS: Phosphorus: 1.9 mg/dL — ABNORMAL LOW (ref 2.5–4.6)

## 2019-04-10 MED ORDER — SODIUM CHLORIDE 0.9% FLUSH
10.0000 mL | Freq: Once | INTRAVENOUS | Status: AC
  Administered 2019-04-10: 10 mL
  Filled 2019-04-10: qty 10

## 2019-04-10 MED ORDER — HEPARIN SOD (PORK) LOCK FLUSH 100 UNIT/ML IV SOLN
500.0000 [IU] | Freq: Once | INTRAVENOUS | Status: AC
  Administered 2019-04-10: 500 [IU]
  Filled 2019-04-10: qty 5

## 2019-04-10 MED ORDER — SODIUM CHLORIDE 0.9 % IV SOLN
Freq: Once | INTRAVENOUS | Status: AC
  Administered 2019-04-10: 12:00:00 via INTRAVENOUS
  Filled 2019-04-10: qty 250

## 2019-04-10 NOTE — Telephone Encounter (Signed)
Faxed orders for skilled nurse to administer 1 liter NS IVF to [facility] [phone]. Fax confirmation received.

## 2019-04-10 NOTE — Progress Notes (Signed)
Pt. Calcium 7.2, per Dr. Irene Limbo hold [facility] for today.

## 2019-04-10 NOTE — Patient Instructions (Signed)
[facility] Discharge Instructions for Patients Receiving Chemotherapy  Today you received the following chemotherapy agents IV Fluids  To help prevent nausea and vomiting after your treatment, we encourage you to take your nausea medication: As directed by MD.   If you develop nausea and vomiting that is not controlled by your nausea medication, call the clinic.   BELOW ARE SYMPTOMS THAT SHOULD BE REPORTED IMMEDIATELY:  *FEVER GREATER THAN 100.5 F  *CHILLS WITH OR WITHOUT FEVER  NAUSEA AND VOMITING THAT IS NOT CONTROLLED WITH YOUR NAUSEA MEDICATION  *UNUSUAL SHORTNESS OF BREATH  *UNUSUAL BRUISING OR BLEEDING  TENDERNESS IN MOUTH AND THROAT WITH OR WITHOUT PRESENCE OF ULCERS  *URINARY PROBLEMS  *BOWEL PROBLEMS  UNUSUAL RASH Items with * indicate a potential emergency and should be followed up as soon as possible.  Feel free to call the clinic should you have any questions or concerns. The clinic phone number is [phone].  Please show the [facility] at check-in to the Emergency Department and triage nurse.  Drink at least 12 ounces of milk per day, increase intake of cheese, nuts and continue taking vitamin D.  Coronavirus (COVID-19) Are you at risk?  Are you at risk for the Coronavirus (COVID-19)?  To be considered HIGH RISK for Coronavirus (COVID-19), you have to meet the following criteria:  . Traveled to [place], [place], [place], [place] or [place]; or in the [place] to [place], [place], [place], or [place]; and have fever, cough, and shortness of breath within the last 2 weeks of travel OR . Been in close contact with a person diagnosed with COVID-19 within the last 2 weeks and have fever, cough, and shortness of breath . IF YOU DO NOT MEET THESE CRITERIA, YOU ARE CONSIDERED LOW RISK FOR COVID-19.  What to do if you are HIGH RISK for COVID-19?  [name] If you are having a medical emergency, call 911. . Seek medical care right away.  Before you go to a doctor's office, urgent care or emergency department, call ahead and tell them about your recent travel, contact with someone diagnosed with COVID-19, and your symptoms. You should receive instructions from your physician's office regarding next steps of care.  . When you arrive at healthcare provider, tell the healthcare staff immediately you have returned from visiting [place], [place], [place], [place] or [place]; or traveled in the [place] to [place], [place], [place], or [place]; in the last two weeks or you have been in close contact with a person diagnosed with COVID-19 in the last 2 weeks.   . Tell the health care staff about your symptoms: fever, cough and shortness of breath. . After you have been seen by a medical provider, you will be either: o Tested for (COVID-19) and discharged home on quarantine except to seek medical care if symptoms worsen, and asked to  - Stay home and avoid contact with others until you get your results (4-5 days)  - Avoid travel on public transportation if possible (such as bus, train, or airplane) or o Sent to the Emergency Department by EMS for evaluation, COVID-19 testing, and possible admission depending on your condition and test results.  What to do if you are LOW RISK for COVID-19?  Reduce your risk of any infection by using the same precautions used for avoiding the common cold or flu:  [name] Wash your hands often with soap and warm water for at least 20 seconds.  If  soap and water are not readily available, use an alcohol-based hand sanitizer with at least 60% alcohol.  . If coughing or sneezing, cover your mouth and nose by coughing or sneezing into the elbow areas of your shirt or coat, into a tissue or into your sleeve (not your hands). . Avoid shaking hands with others and consider head nods or verbal greetings only. . Avoid touching your eyes, nose, or mouth with unwashed hands.  . Avoid close contact with people who are  sick. . Avoid places or events with large numbers of people in one location, like concerts or sporting events. . Carefully consider travel plans you have or are making. . If you are planning any travel outside or inside the [place], visit the CDC's Travelers' Health webpage for the latest health notices. . If you have some symptoms but not all symptoms, continue to monitor at home and seek medical attention if your symptoms worsen. . If you are having a medical emergency, call 911.   [facility] / e-Visit: [email or web address]         [facility]: [facility]: [phone]                   [facility]: [phone]

## 2019-04-10 NOTE — Telephone Encounter (Signed)
Received call report from [place].  "Today's Mg = 1.4."  Scheduled for IVF now.  Routing information to provider.  Further communication through infusion nurse per treatment protocol.

## 2019-04-12 DIAGNOSIS — I10 Essential (primary) hypertension: Secondary | ICD-10-CM | POA: Diagnosis not present

## 2019-04-12 DIAGNOSIS — Z86711 Personal history of pulmonary embolism: Secondary | ICD-10-CM | POA: Diagnosis not present

## 2019-04-12 DIAGNOSIS — E559 Vitamin D deficiency, unspecified: Secondary | ICD-10-CM | POA: Diagnosis not present

## 2019-04-12 DIAGNOSIS — M84550D Pathological fracture in neoplastic disease, pelvis, subsequent encounter for fracture with routine healing: Secondary | ICD-10-CM | POA: Diagnosis not present

## 2019-04-12 DIAGNOSIS — I2510 Atherosclerotic heart disease of native coronary artery without angina pectoris: Secondary | ICD-10-CM | POA: Diagnosis not present

## 2019-04-12 DIAGNOSIS — I700 Atherosclerosis of aorta: Secondary | ICD-10-CM | POA: Diagnosis not present

## 2019-04-12 DIAGNOSIS — C160 Malignant neoplasm of cardia: Secondary | ICD-10-CM | POA: Diagnosis not present

## 2019-04-12 DIAGNOSIS — Z7901 Long term (current) use of anticoagulants: Secondary | ICD-10-CM | POA: Diagnosis not present

## 2019-04-12 DIAGNOSIS — J432 Centrilobular emphysema: Secondary | ICD-10-CM | POA: Diagnosis not present

## 2019-04-12 DIAGNOSIS — C7951 Secondary malignant neoplasm of bone: Secondary | ICD-10-CM | POA: Diagnosis not present

## 2019-04-12 DIAGNOSIS — K589 Irritable bowel syndrome without diarrhea: Secondary | ICD-10-CM | POA: Diagnosis not present

## 2019-04-12 DIAGNOSIS — K2270 Barrett's esophagus without dysplasia: Secondary | ICD-10-CM | POA: Diagnosis not present

## 2019-04-12 DIAGNOSIS — R918 Other nonspecific abnormal finding of lung field: Secondary | ICD-10-CM | POA: Diagnosis not present

## 2019-04-12 DIAGNOSIS — C3490 Malignant neoplasm of unspecified part of unspecified bronchus or lung: Secondary | ICD-10-CM | POA: Diagnosis not present

## 2019-04-12 DIAGNOSIS — K219 Gastro-esophageal reflux disease without esophagitis: Secondary | ICD-10-CM | POA: Diagnosis not present

## 2019-04-12 DIAGNOSIS — G893 Neoplasm related pain (acute) (chronic): Secondary | ICD-10-CM | POA: Diagnosis not present

## 2019-04-12 DIAGNOSIS — E785 Hyperlipidemia, unspecified: Secondary | ICD-10-CM | POA: Diagnosis not present

## 2019-04-12 DIAGNOSIS — J438 Other emphysema: Secondary | ICD-10-CM | POA: Diagnosis not present

## 2019-04-15 ENCOUNTER — Ambulatory Visit: Payer: Medicare Other | Admitting: Internal Medicine

## 2019-04-15 ENCOUNTER — Encounter: Payer: Self-pay | Admitting: Internal Medicine

## 2019-04-15 ENCOUNTER — Other Ambulatory Visit: Payer: Self-pay

## 2019-04-15 ENCOUNTER — Ambulatory Visit (INDEPENDENT_AMBULATORY_CARE_PROVIDER_SITE_OTHER): Payer: Medicare Other

## 2019-04-15 VITALS — BP 146/78 | HR 111 | Temp 98.4°F | Ht 68.5 in | Wt 135.4 lb

## 2019-04-15 DIAGNOSIS — I700 Atherosclerosis of aorta: Secondary | ICD-10-CM | POA: Diagnosis not present

## 2019-04-15 DIAGNOSIS — C160 Malignant neoplasm of cardia: Secondary | ICD-10-CM | POA: Diagnosis not present

## 2019-04-15 DIAGNOSIS — R0600 Dyspnea, unspecified: Secondary | ICD-10-CM | POA: Diagnosis not present

## 2019-04-15 DIAGNOSIS — Z86711 Personal history of pulmonary embolism: Secondary | ICD-10-CM | POA: Diagnosis not present

## 2019-04-15 DIAGNOSIS — R918 Other nonspecific abnormal finding of lung field: Secondary | ICD-10-CM | POA: Diagnosis not present

## 2019-04-15 DIAGNOSIS — R0609 Other forms of dyspnea: Secondary | ICD-10-CM

## 2019-04-15 DIAGNOSIS — E559 Vitamin D deficiency, unspecified: Secondary | ICD-10-CM | POA: Diagnosis not present

## 2019-04-15 DIAGNOSIS — C3490 Malignant neoplasm of unspecified part of unspecified bronchus or lung: Secondary | ICD-10-CM | POA: Diagnosis not present

## 2019-04-15 DIAGNOSIS — K2270 Barrett's esophagus without dysplasia: Secondary | ICD-10-CM | POA: Diagnosis not present

## 2019-04-15 DIAGNOSIS — K219 Gastro-esophageal reflux disease without esophagitis: Secondary | ICD-10-CM | POA: Diagnosis not present

## 2019-04-15 DIAGNOSIS — C7951 Secondary malignant neoplasm of bone: Secondary | ICD-10-CM | POA: Diagnosis not present

## 2019-04-15 DIAGNOSIS — J438 Other emphysema: Secondary | ICD-10-CM | POA: Diagnosis not present

## 2019-04-15 DIAGNOSIS — I2510 Atherosclerotic heart disease of native coronary artery without angina pectoris: Secondary | ICD-10-CM | POA: Diagnosis not present

## 2019-04-15 DIAGNOSIS — E785 Hyperlipidemia, unspecified: Secondary | ICD-10-CM | POA: Diagnosis not present

## 2019-04-15 DIAGNOSIS — J432 Centrilobular emphysema: Secondary | ICD-10-CM | POA: Diagnosis not present

## 2019-04-15 DIAGNOSIS — G893 Neoplasm related pain (acute) (chronic): Secondary | ICD-10-CM | POA: Diagnosis not present

## 2019-04-15 DIAGNOSIS — Z7901 Long term (current) use of anticoagulants: Secondary | ICD-10-CM | POA: Diagnosis not present

## 2019-04-15 DIAGNOSIS — I10 Essential (primary) hypertension: Secondary | ICD-10-CM | POA: Diagnosis not present

## 2019-04-15 DIAGNOSIS — M84550D Pathological fracture in neoplastic disease, pelvis, subsequent encounter for fracture with routine healing: Secondary | ICD-10-CM | POA: Diagnosis not present

## 2019-04-15 DIAGNOSIS — K589 Irritable bowel syndrome without diarrhea: Secondary | ICD-10-CM | POA: Diagnosis not present

## 2019-04-15 NOTE — Patient Instructions (Signed)
Please remember to go to the lab and x-ray department   for your tests - we will call you with the results when they are available.     Please schedule a follow up office visit in 6 weeks, call sooner if needed

## 2019-04-15 NOTE — Progress Notes (Signed)
Cindy Byrd, female    DOB: [birth year],    MRN: [id number]   Brief patient profile:  78 yowf quit smoking 12/2014 with h/o met [place] lung ca s resection with indolent onset doe x spring 2019 to point where doe x room to room assoc with cough  Dx dvt/ pe 09/2016 with nl RV says presented with  Sob but  no better on lovenox or xarelto  Es ca rx  RT around end of March 20 2019    referred to pulmonary clinic 04/15/2019 by Dr   Irene Limbo for doe note 04/03/19 :  DIAGNOSIS:   #1 Metastatic non-small cell lung cancer with bilateral lung nodules and large metastatic lesion in the left Ilium. 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 Also had c diff colitis - treated   Current Treatment  1) Active surveillance 2) Xgeva 14m  q4weeks for bone metastases. 3) Sandostatin q4weeks for diarrhea - immune colitis  Previous Treatment  1 Palliative radiation therapy to the large left ilium metastases 2. IV Nivolumab x 20ycles (discontinued due to likely immune colitis) 3. Xgeva 1269mSC q4weeks for bone metastases.   History of Present Illness  04/15/2019  Pulmonary/ 1st office eval/Aikam Hellickson  Chief Complaint  Patient presents with   Pulmonary Consult    Referred by Dr. KaIrene LimboPt c/o SOB for the past year-progressively getting worse. She gets winded walking room to room at home.   Dyspnea:  Room to room / ok at rest and hs flat Cough: min thick white mucus / no am flare Sleep: on side with bed flat /one pillow SABA use: no better p saba   No obvious day to day or daytime variability or assoc e purulent sputum or mucus plugs or hemoptysis or cp or chest tightness, subjective wheeze or overt sinus or hb symptoms.   Sleeps as above without nocturnal  or early am exacerbation  of respiratory  c/o's or need for noct saba. Also denies any obvious fluctuation of symptoms with weather or environmental changes or other aggravating or  alleviating factors except as outlined above   No unusual exposure hx or h/o childhood pna/ asthma or knowledge of premature birth.  Current Allergies, Complete Past Medical History, Past Surgical History, Family History, and Social History were reviewed in [employer].  ROS  The following are not active complaints unless bolded Hoarseness, sore throat, dysphagia, dental problems, itching, sneezing,  nasal congestion or discharge of excess mucus or purulent secretions, ear ache,   fever, chills, sweats, unintended wt loss or wt gain, classically pleuritic or exertional cp,  orthopnea pnd or arm/hand swelling  or leg swelling, presyncope, palpitations, abdominal pain, anorexia, nausea, vomiting, diarrhea  or change in bowel habits or change in bladder habits, change in stools or change in urine, dysuria, hematuria,  rash, arthralgias, visual complaints, headache, numbness, weakness or ataxia or problems with walking or coordination,  change in mood or  memory.           Past Medical History:  Diagnosis Date   Barrett's esophagus    Bone neoplasm 06/24/2015   [facility]   metastatic poorly differentiated carcinoma. tumor left groin surgical removal with radiation tx.   Cataract    BILATERAL   Cigarette smoker two packs a day or less    Currently still smoking 2 PPD - Not interested in quitting at this time.   Colitis 2017  Colon polyps    hyperplastic, tubular adenomas, tubulovillous adenoma   Cough, persistent    hx. lung cancer ? primary-being evaluated, unsure of primary site.   Depression 06/24/2015   Diverticulosis    Emphysema of lung ([facility])    Endometriosis    Hysterectomy with BSO at age 71 yrs   Esophageal adenocarcinoma ([facility]) 08/11/15   intramucosal   Gastritis    GERD (gastroesophageal reflux disease)    H/O: pneumonia    Hiatal hernia    Hyperlipidemia    Hypertension 06/24/2015   likely improved incidental to 40 lbs  weight loss from her neoplasm. No Longer taking med for this as of 08-06-15   IBS (irritable bowel syndrome)    Pain    left hip-persistent"tumor of bone"-radiation tx. 10.   Vitamin D deficiency disease     Outpatient Medications Prior to Visit  Medication Sig Dispense Refill   albuterol (PROVENTIL HFA;VENTOLIN HFA) 108 (90 Base) MCG/ACT inhaler Inhale 1 puff into the lungs every 6 (six) hours as needed for wheezing or shortness of breath. 1 Inhaler 1   amitriptyline (ELAVIL) 25 MG tablet Take 25 mg by mouth at bedtime.      Cholecalciferol (VITAMIN D3) 25 MCG (1000 UT) CAPS Take 1,000 Units by mouth daily.     dexamethasone (DECADRON) 2 MG tablet Take 1 tablet (2 mg total) by mouth daily with breakfast. 30 tablet 0   dronabinol (MARINOL) 2.5 MG capsule Take 1 capsule (2.5 mg total) by mouth 2 (two) times daily before a meal. 60 capsule 0   FLUoxetine (PROZAC) 20 MG capsule Take 1 capsule (20 mg total) by mouth daily. 90 capsule 3   KLOR-CON M20 20 MEQ tablet TAKE 1 TABLET BY MOUTH TWICE A DAY 60 tablet 1   lidocaine-prilocaine (EMLA) cream Apply to affected area once (Patient taking differently: Apply 1 application topically as needed (port access). Apply to affected area once) 30 g 3   LORazepam (ATIVAN) 0.5 MG tablet Take 1 tablet (0.5 mg total) by mouth every 6 (six) hours as needed (Nausea or vomiting). 30 tablet 0   magnesium oxide (MAG-OX) 400 (241.3 Mg) MG tablet Take 1 tablet (400 mg total) by mouth daily. 60 tablet 1   nystatin (MYCOSTATIN) 100000 UNIT/ML suspension Take 5 mLs (500,000 Units total) by mouth 4 (four) times daily. Swish and swallow 200 mL 1   omeprazole (PRILOSEC) 40 MG capsule TAKE 1 CAPSULE BY MOUTH EVERY DAY (Patient taking differently: Take 40 mg by mouth 2 (two) times daily. ) 90 capsule 2   ondansetron (ZOFRAN) 8 MG tablet Take 1 tablet (8 mg total) by mouth 2 (two) times daily as needed for refractory nausea / vomiting. Start on day 3 after chemo.  30 tablet 1   Probiotic Product (PROBIOTIC PO) Take 1 capsule by mouth 2 (two) times daily.     prochlorperazine (COMPAZINE) 10 MG tablet Take 1 tablet (10 mg total) by mouth every 6 (six) hours as needed (Nausea or vomiting). 30 tablet 1   sucralfate (CARAFATE) 1 g tablet Take 1 tablet (1 g total) by mouth 4 (four) times daily. 120 tablet 2   traZODone (DESYREL) 50 MG tablet TAKE 1 TABLET BY MOUTH EVERYDAY AT BEDTIME (Patient taking differently: Take 50 mg by mouth at bedtime. ) 90 tablet 2   Vitamin D, Ergocalciferol, (DRISDOL) 1.25 MG (50000 UT) CAPS capsule Take 50,000 Units by mouth every 7 (seven) days.      VITAMIN E PO Take 1  tablet by mouth daily.     XARELTO 20 MG TABS tablet TAKE 1 TABLET DAILY WITH SUPPER. DC LOVENOX AND START XARELTO AT THE SCHEDULED TIME AS INSTRUCTED. (Patient taking differently: Take 20 mg by mouth daily with supper. ) 90 tablet 1   amLODipine (NORVASC) 10 MG tablet TAKE 1 TABLET BY MOUTH EVERY DAY (Patient not taking: No sig reported) 90 tablet 0          naproxen sodium (ALEVE) 220 MG tablet Take 440 mg by mouth daily as needed (pain).        Objective:     BP (!) 146/78 (BP Location: Left Arm, Cuff Size: Normal)    Pulse (!) 111    Temp 98.4 F (36.9 C) (Oral)    Ht 5' 8.5" (1.74 m)    Wt 135 lb 6.4 oz (61.4 kg)    SpO2 97%    BMI 20.29 kg/m   SpO2: 97 %  RA   Chronically ill elderly wf nad  HEENT: nl dentition, turbinates bilaterally, and oropharynx. Nl external ear canals without cough reflex   NECK :  without JVD/Nodes/TM/ nl carotid upstrokes bilaterally   LUNGS: no acc muscle use,  Nl contour chest which is clear to A and P bilaterally without cough on insp or exp maneuvers   CV:  RRR  no s3 or murmur or increase in P2, and no edema   ABD:  soft and nontender with nl inspiratory excursion in the supine position. No bruits or organomegaly appreciated, bowel sounds nl  MS:  Nl gait/ ext warm without deformities, calf tenderness,  cyanosis or clubbing No obvious joint restrictions   SKIN: warm and dry without lesions    NEURO:  alert, approp, nl sensorium with  no motor or cerebellar deficits apparent.    CXR PA and Lateral:   04/15/2019 :    I personally reviewed images and   impression as follows:   COPD and scarring. No superimposed acute abnormality and no interval change from the prior study.   I personally reviewed images and agree with radiology impression as follows:   Chest CT 02/25/19 No pe There is underlying centrilobular emphysematous change. There are areas of lower lobe atelectatic change. No edema or consolidation. No pleural effusion or pleural thickening evident. There is a 4 mm nodular opacity in the lateral segment of the right lower lobe, also seen on recent studies.          Labs ordered/ reviewed:      Chemistry      Component Value Date/Time   NA 140 04/10/2019 1040   NA 143 10/24/2018 1412   NA 141 11/16/2017 1422   K 3.8 04/10/2019 1040   K 3.6 11/16/2017 1422   CL 112 (H) 04/10/2019 1040   CO2 19 (L) 04/10/2019 1040   CO2 28 11/16/2017 1422   BUN 15 04/10/2019 1040   BUN 18 10/24/2018 1412   BUN 12.6 11/16/2017 1422   CREATININE 0.97 04/10/2019 1040   CREATININE 0.9 11/16/2017 1422   GLU 107 09/13/2016      Component Value Date/Time   CALCIUM 7.2 (L) 04/10/2019 1040   CALCIUM 8.9 11/16/2017 1422   ALKPHOS 75 04/10/2019 1040   ALKPHOS 72 11/16/2017 1422   AST 21 04/10/2019 1040   AST 43 (H) 11/16/2017 1422   ALT 8 04/10/2019 1040   ALT 27 11/16/2017 1422   BILITOT 0.5 04/10/2019 1040   BILITOT 0.36 11/16/2017 1422  Lab Results  Component Value Date   WBC 3.9 (L) 04/10/2019   HGB 8.7 (L) 04/10/2019   HCT 27.9 (L) 04/10/2019   MCV 85.6 04/10/2019   PLT 169 04/10/2019        Lab Results  Component Value Date   TSH 2.37 04/15/2019      PROBNP  04/15/2019  = 212   Lab Results  Component Value Date   ESRSEDRATE 19 04/15/2019   ESRSEDRATE  23 (H) 09/06/2016   ESRSEDRATE 60 (H) 08/27/2015             Assessment   DOE (dyspnea on exertion) Onset spring 2019 - CTa 02/25/19  Centrilobular emphysema/  no PE  - echo 03/14/19   1. The left ventricle has normal systolic function with an ejection fraction of 60-65%. The cavity size was normal. Left ventricular diastolic Doppler parameters are consistent with impaired relaxation.  2. Normal GLS -22.2.  3. The right ventricle has normal systolic function. The cavity was normal. There is no increase in right ventricular wall thickness.  4. The aortic valve was not well visualized. Aortic valve regurgitation is trivial by color flow Doppler - HC03  04/10/19 = 19 and Hgb = 8.7  - 04/15/2019   Walked RA  2 laps @  approx 290f each @ avg pace  stopped due to  End of study, min sob  /sats 90% at end with hgb 8.7 and HC03  19 with non AG criteria   Symptoms are markedly disproportionate to objective findings and not clear to what extent this is actually a pulmonary  problem but pt does appear to have difficult to sort out respiratory symptoms of unknown origin for which  DDX  = almost all start with A and  include Adherence, Ace Inhibitors, Acid Reflux, Active Sinus Disease, Alpha 1 Antitripsin deficiency, Anxiety masquerading as Airways dz,  ABPA,  Allergy(esp in young), Aspiration (esp in elderly), Adverse effects of meds,  Active smoking or Vaping, A bunch of PE's/clot burden (a few small clots can't cause this syndrome unless there is already severe underlying pulm or vascular dz with poor reserve),  Anemia or thyroid disorder, plus two Bs  = Bronchiectasis and Beta blocker use..and one C= CHF     Adherence is always the initial "prime suspect" and is a multilayered concern that requires a "trust but verify" approach in every patient - starting with knowing how to use medications, especially inhalers, correctly, keeping up with refills and understanding the fundamental difference between  maintenance and prns vs those medications only taken for a very short course and then stopped and not refilled.  - very complex regimen noted  - rec return with all meds in hand using a trust but verify approach to confirm accurate Medication  Reconciliation The principal here is that until we are certain that the  patients are doing what we've asked, it makes no sense to ask them to do more.    ? Allergy/asthma > no response to inhalers in past, but no pfts on record so rec she return when COVID - 19 restrictions have been lifted so we can at least offer spirometry  - in meantime explained: I spent extra time with pt today reviewing appropriate use of albuterol for prn use on exertion with the following points: 1) saba is for relief of sob that does not improve by walking a slower pace or resting but rather if the pt does not improve after trying this first. 2)  If the pt is convinced, as many are, that saba helps recover from activity faster then it's easy to tell if this is the case by re-challenging : ie stop, take the inhaler, then p 5 minutes try the exact same activity (intensity of workload) that just caused the symptoms and see if they are substantially diminished or not after saba 3) if there is an activity that reproducibly causes the symptoms, try the saba 15 min before the activity on alternate days   If in fact the saba really does help, then fine to continue to use it prn but advised may need to look closer at the maintenance regimen being used to achieve better control of airways disease with exertion.  - - The proper method of use, as well as anticipated side effects, of a metered-dose inhaler are discussed and demonstrated to the patient.  ? Anemia contributing > rx per oncology  ? A new A  = met acidosis, increases Ve demand to offset, non-AG so prob related to diarrea or RTA  ? Adverse effects of meds  Esp RT/ chemo in this setting > low esr reassuring but does not exclude   ? A  bunch of PE's > neg CTa noted, on  xarelto   ? chf >  bnp is in the indeterminate range but last echo shows only diastolic dysfunction with nl LA size which is reassuring here.   >>> Since problem is chronic and  I see no immediate life-threatening conditions here and we are still under COVID restrictions I simply asked the patient to return in 6 weeks to complete the work-up but with at least spirometry at that point if not full PFTs but to contact me in the meantime if she is losing ground with activity tolerance.     Total time devoted to counseling  > 50 % of initial 60 min office visit:  review case with pt/ directly observed portions of ambulatory 02 saturation study/ and device teaching which extended face to face time for this visit     discussion of options/alternatives/ personally creating written customized instructions  in presence of pt  then going over those specific  Instructions directly with the pt including how to use all of the meds but in particular covering each new medication in detail and the difference between the maintenance= "automatic" meds and the prns using an action plan format for the latter (If this problem/symptom => do that organization reading Left to right).  Please see AVS from this visit for a full list of these instructions which I personally wrote for this pt and  are unique to this visit.   Christinia Gully, MD 04/15/2019

## 2019-04-16 ENCOUNTER — Telehealth: Payer: Self-pay | Admitting: Radiation Oncology

## 2019-04-16 LAB — SEDIMENTATION RATE: Sed Rate: 19 mm/hr (ref 0–30)

## 2019-04-16 LAB — TSH: TSH: 2.37 u[IU]/mL (ref 0.35–4.50)

## 2019-04-16 LAB — BRAIN NATRIURETIC PEPTIDE: Pro B Natriuretic peptide (BNP): 212 pg/mL — ABNORMAL HIGH (ref 0.0–100.0)

## 2019-04-16 NOTE — Progress Notes (Signed)
Spoke with pt and notified of results per Dr. Wert. Pt verbalized understanding and denied any questions. 

## 2019-04-16 NOTE — Assessment & Plan Note (Addendum)
Onset spring 2019 - CTa 02/25/19  Centrilobular emphysema/  no PE  - echo 03/14/19   1. The left ventricle has normal systolic function with an ejection fraction of 60-65%. The cavity size was normal. Left ventricular diastolic Doppler parameters are consistent with impaired relaxation.  2. Normal GLS -22.2.  3. The right ventricle has normal systolic function. The cavity was normal. There is no increase in right ventricular wall thickness.  4. The aortic valve was not well visualized. Aortic valve regurgitation is trivial by color flow Doppler - HC03  04/10/19 = 19 and Hgb = 8.7  - 04/15/2019   Walked RA  2 laps @  approx 210f each @ avg pace  stopped due to  End of study, min sob  /sats 90% at end with hgb 8.7 and HC03  19 with non AG criteria   Symptoms are markedly disproportionate to objective findings and not clear to what extent this is actually a pulmonary  problem but pt does appear to have difficult to sort out respiratory symptoms of unknown origin for which  DDX  = almost all start with A and  include Adherence, Ace Inhibitors, Acid Reflux, Active Sinus Disease, Alpha 1 Antitripsin deficiency, Anxiety masquerading as Airways dz,  ABPA,  Allergy(esp in young), Aspiration (esp in elderly), Adverse effects of meds,  Active smoking or Vaping, A bunch of PE's/clot burden (a few small clots can't cause this syndrome unless there is already severe underlying pulm or vascular dz with poor reserve),  Anemia or thyroid disorder, plus two Bs  = Bronchiectasis and Beta blocker use..and one C= CHF     Adherence is always the initial "prime suspect" and is a multilayered concern that requires a "trust but verify" approach in every patient - starting with knowing how to use medications, especially inhalers, correctly, keeping up with refills and understanding the fundamental difference between maintenance and prns vs those medications only taken for a very short course and then stopped and not refilled.  -  very complex regimen noted  - rec return with all meds in hand using a trust but verify approach to confirm accurate Medication  Reconciliation The principal here is that until we are certain that the  patients are doing what we've asked, it makes no sense to ask them to do more.    ? Allergy/asthma > no response to inhalers in past, but no pfts on record so rec she return when COVID - 19 restrictions have been lifted so we can at least offer spirometry  - in meantime explained: I spent extra time with pt today reviewing appropriate use of albuterol for prn use on exertion with the following points: 1) saba is for relief of sob that does not improve by walking a slower pace or resting but rather if the pt does not improve after trying this first. 2) If the pt is convinced, as many are, that saba helps recover from activity faster then it's easy to tell if this is the case by re-challenging : ie stop, take the inhaler, then p 5 minutes try the exact same activity (intensity of workload) that just caused the symptoms and see if they are substantially diminished or not after saba 3) if there is an activity that reproducibly causes the symptoms, try the saba 15 min before the activity on alternate days   If in fact the saba really does help, then fine to continue to use it prn but advised may need to look closer  at the maintenance regimen being used to achieve better control of airways disease with exertion.  - - The proper method of use, as well as anticipated side effects, of a metered-dose inhaler are discussed and demonstrated to the patient.  ? Anemia contributing > rx per oncology  ? A new A  = met acidosis, increases Ve demand to offset, non-AG so prob related to diarrea or RTA  ? Adverse effects of meds  Esp RT/ chemo in this setting > low esr reassuring but does not exclude   ? A bunch of PE's > neg CTa noted, on  xarelto   ? chf >  bnp is in the indeterminate range but last echo shows only  diastolic dysfunction with nl LA size which is reassuring here.   >>> Since problem is chronic and  I see no immediate life-threatening conditions here and we are still under COVID restrictions I simply asked the patient to return in 6 weeks to complete the work-up but with at least spirometry at that point if not full PFTs but to contact me in the meantime if she is losing ground with activity tolerance.   Total time devoted to counseling  > 50 % of initial 60 min office visit:  review case with pt/ directly observed portions of ambulatory 02 saturation study/ and device teaching which extended face to face time for this visit     discussion of options/alternatives/ personally creating written customized instructions  in presence of pt  then going over those specific  Instructions directly with the pt including how to use all of the meds but in particular covering each new medication in detail and the difference between the maintenance= "automatic" meds and the prns using an action plan format for the latter (If this problem/symptom => do that organization reading Left to right).  Please see AVS from this visit for a full list of these instructions which I personally wrote for this pt and  are unique to this visit.

## 2019-04-16 NOTE — Progress Notes (Signed)
HEMATOLOGY ONCOLOGY PROGRESS NOTE  Date of service:  04/17/19    Patient Care Team: [name], NP as PCP - General (Family Medicine) [name], MD as Consulting Physician (Hematology and Oncology)  CHIEF COMPLAINTS/PURPOSE OF CONSULTATION:  F/u for metastatic lung cancer  DIAGNOSIS:   #1 Metastatic non-small cell lung cancer with bilateral lung nodules and large metastatic lesion in the left Ilium. 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 Also had c diff colitis - treated   Current Treatment  1) Active surveillance 2) Xgeva 125m [place] q4weeks for bone metastases. 3) Sandostatin q4weeks for diarrhea - immune colitis  Previous Treatment  1 Palliative radiation therapy to the large left ilium metastases 2. IV Nivolumab x 20ycles (discontinued due to likely immune colitis) 3. Xgeva 1214mSC q4weeks for bone metastases.   HISTORY OF PRESENTING ILLNESS: (plz see my previous consultation for details of initial presentation)  INTERVAL HISTORY:   Cindy Byrd here for her scheduled follow-up for metastatic lung cancer, and adenocarcinoma of the esophagus. The patient's last visit with [place] on 04/03/19. The pt reports that she is doing well overall.  The pt reports that she has been feeling better as she is getting IVF three times a week. She notes that the marinol has been helpful for her appetite and has been eating better. She notes that she is now eating about a fourth of what she normally did. She notes that "nothing tastes right," but is trying to eat more. She notes that she is consuming 2 Ensure each day.  She notes that she saw pulmonology two days ago, and after walking her minimum O2 sats were 90%. She notes that there were not specific recommendations but that she could simply be deconditioned. The pt notes that she is slowly trying to walk more in her home and notes that she her SOB is slowly  improving.   She notes that she has fallen twice, denies sustaining injury, having felt dizzy when standing. She stopped taking Amlodipine.  Lab results today (04/17/19) of CBC w/diff and CMP is as follows: all values are WNL except for RBC at 3.51, HGB at 9.7, HCT at 31.1, RDW at 21.8, Potassium at 3.3, Calcium at 7.9, Total Protein at 6.4, Albumin at 2.6, GFR at 55. 04/17/19 Phosphorous at 2.7 and Magnesium at 1.5  On review of systems, pt reports eating better, improved appetite, improving energy levels, slowly improving SOB upon exertion, and denies concerns for infections, and any other symptoms.  MEDICAL HISTORY:  Past Medical History:  Diagnosis Date  . Barrett's esophagus   . Bone neoplasm 06/24/2015  . [facility]   metastatic poorly differentiated carcinoma. tumor left groin surgical removal with radiation tx.  . Cataract    BILATERAL  . Cigarette smoker two packs a day or less    Currently still smoking 2 PPD - Not interested in quitting at this time.  . Colitis 2017  . Colon polyps    hyperplastic, tubular adenomas, tubulovillous adenoma  . Cough, persistent    hx. lung cancer ? primary-being evaluated, unsure of primary site.  . Depression 06/24/2015  . Diverticulosis   . [place]  . Endometriosis    Hysterectomy with BSO at age [age]rs  . Esophageal adenocarcinoma [place]   intramucosal  . Gastritis   . GERD (gastroesophageal reflux disease)   . H/O: pneumonia   .  Hiatal hernia   . Hyperlipidemia   . Hypertension 06/24/2015   likely improved incidental to 40 lbs weight loss from her neoplasm. No Longer taking med for this as of 08-06-15  . IBS (irritable bowel syndrome)   . Pain    left hip-persistent"tumor of bone"-radiation tx. 10.  . Vitamin D deficiency disease    SURGICAL HISTORY: Past Surgical History:  Procedure Laterality Date  . BARTHOLIN GLAND CYST EXCISION  78 yo ago   Does not want if it was an infected cyst or tumor. Was soon  as delivery  . BIOPSY  01/02/2019   Procedure: BIOPSY;  Surgeon: [name], MD;  [facility];  Service: Gastroenterology;;  . CATARACT EXTRACTION    . COLONOSCOPY W/ POLYPECTOMY     multiple times - last done 09/2014 per patient.  . ESOPHAGOGASTRODUODENOSCOPY (EGD) WITH PROPOFOL N/A 08/11/2015   Procedure: ESOPHAGOGASTRODUODENOSCOPY (EGD) WITH PROPOFOL;  Surgeon: [name], MD;  Location: [facility];  Service: Gastroenterology;  Laterality: N/A;  . ESOPHAGOGASTRODUODENOSCOPY (EGD) WITH PROPOFOL N/A 01/02/2019   Procedure: ESOPHAGOGASTRODUODENOSCOPY (EGD) WITH PROPOFOL;  Surgeon: [name], MD;  Location: [facility];  Service: Gastroenterology;  Laterality: N/A;  . FLEXIBLE SIGMOIDOSCOPY N/A 06/24/2017   Procedure: FLEXIBLE SIGMOIDOSCOPY;  Surgeon: Manus Gunning, MD;  Location: [facility];  Service: Gastroenterology;  Laterality: N/A;  . GANGLION CYST EXCISION    . KNEE ARTHROSCOPY  age about 46 yrs  . TONSILLECTOMY    . TOTAL ABDOMINAL HYSTERECTOMY W/ BILATERAL SALPINGOOPHORECTOMY  at age 26 yrs   For endometriosis    SOCIAL HISTORY: Social History   Socioeconomic History  . Marital status: Widowed    Spouse name: Not on file  . Number of children: 2  . Years of education: Not on file  . Highest education level: Not on file  Occupational History  . Not on file  Social Needs  . Financial resource strain: Not on file  . Food insecurity:    Worry: Not on file    Inability: Not on file  . Transportation needs:    Medical: No    Non-medical: No  Tobacco Use  . Smoking status: Former Smoker    Packs/day: 1.00    Years: 60.00    Pack years: 60.00    Types: Cigarettes    Last attempt to quit: 12/05/2014    Years since quitting: 4.3  . Smokeless tobacco: Never Used  Substance and Sexual Activity  . Alcohol use: No    Alcohol/week: 0.0 standard drinks  . Drug use: No  . Sexual activity: Never  Lifestyle  . Physical activity:    Days per week:  Not on file    Minutes per session: Not on file  . Stress: Not on file  Relationships  . Social connections:    Talks on phone: Not on file    Gets together: Not on file    Attends religious service: Not on file    Active member of club or organization: Not on file    Attends meetings of clubs or organizations: Not on file    Relationship status: Not on file  . Intimate partner violence:    Fear of current or ex partner: Not on file    Emotionally abused: Not on file    Physically abused: Not on file    Forced sexual activity: Not on file  Other Topics Concern  . Not on file  Social History Narrative  . Not on file  FAMILY HISTORY: Family History  Problem Relation Age of Onset  . Colon cancer Brother   . Colon cancer Brother   . Stroke Mother   . Colon cancer Father   . Emphysema Father        smoked  . Breast cancer Daughter 51       ER/PR+ stage II    ALLERGIES:  is allergic to penicillins; remeron [mirtazapine]; and latex. patient wonders if she has a penicillin allergy but notes that she is uncertain about this.  MEDICATIONS:  Current Outpatient Medications  Medication Sig Dispense Refill  . albuterol (PROVENTIL HFA;VENTOLIN HFA) 108 (90 Base) MCG/ACT inhaler Inhale 1 puff into the lungs every 6 (six) hours as needed for wheezing or shortness of breath. 1 Inhaler 1  . amitriptyline (ELAVIL) 25 MG tablet Take 25 mg by mouth at bedtime.     . Cholecalciferol (VITAMIN D3) 25 MCG (1000 UT) CAPS Take 1,000 Units by mouth daily.    [name] dexamethasone (DECADRON) 2 MG tablet Take 1 tablet (2 mg total) by mouth daily with breakfast. 30 tablet 0  . dronabinol (MARINOL) 2.5 MG capsule Take 1 capsule (2.5 mg total) by mouth 2 (two) times daily before a meal. 60 capsule 0  . FLUoxetine (PROZAC) 20 MG capsule Take 1 capsule (20 mg total) by mouth daily. 90 capsule 3  . KLOR-CON M20 20 MEQ tablet TAKE 1 TABLET BY MOUTH TWICE A DAY 60 tablet 1  . lidocaine-prilocaine (EMLA) cream  Apply to affected area once (Patient taking differently: Apply 1 application topically as needed (port access). Apply to affected area once) 30 g 3  . LORazepam (ATIVAN) 0.5 MG tablet Take 1 tablet (0.5 mg total) by mouth every 6 (six) hours as needed (Nausea or vomiting). 30 tablet 0  . magnesium oxide (MAG-OX) 400 (241.3 Mg) MG tablet Take 1 tablet (400 mg total) by mouth daily. 60 tablet 1  . nystatin (MYCOSTATIN) 100000 UNIT/ML suspension Take 5 mLs (500,000 Units total) by mouth 4 (four) times daily. Swish and swallow 200 mL 1  . omeprazole (PRILOSEC) 40 MG capsule TAKE 1 CAPSULE BY MOUTH EVERY DAY (Patient taking differently: Take 40 mg by mouth 2 (two) times daily. ) 90 capsule 2  . ondansetron (ZOFRAN) 8 MG tablet Take 1 tablet (8 mg total) by mouth 2 (two) times daily as needed for refractory nausea / vomiting. Start on day 3 after chemo. 30 tablet 1  . Probiotic Product (PROBIOTIC PO) Take 1 capsule by mouth 2 (two) times daily.    . prochlorperazine (COMPAZINE) 10 MG tablet Take 1 tablet (10 mg total) by mouth every 6 (six) hours as needed (Nausea or vomiting). 30 tablet 1  . sucralfate (CARAFATE) 1 g tablet Take 1 tablet (1 g total) by mouth 4 (four) times daily. 120 tablet 2  . traZODone (DESYREL) 50 MG tablet TAKE 1 TABLET BY MOUTH EVERYDAY AT BEDTIME (Patient taking differently: Take 50 mg by mouth at bedtime. ) 90 tablet 2  . Vitamin D, Ergocalciferol, (DRISDOL) 1.25 MG (50000 UT) CAPS capsule Take 50,000 Units by mouth every 7 (seven) days.     [name] VITAMIN E PO Take 1 tablet by mouth daily.    Alveda Reasons 20 MG TABS tablet TAKE 1 TABLET DAILY WITH SUPPER. DC LOVENOX AND START XARELTO AT THE SCHEDULED TIME AS INSTRUCTED. (Patient taking differently: Take 20 mg by mouth daily with supper. ) 90 tablet 1   No current facility-administered medications for this visit.  REVIEW OF SYSTEMS:    A 10+ POINT REVIEW OF SYSTEMS WAS OBTAINED including neurology, dermatology, psychiatry, cardiac,  respiratory, lymph, extremities, GI, GU, Musculoskeletal, constitutional, breasts, reproductive, HEENT.  All pertinent positives are noted in the HPI.  All others are negative.   PHYSICAL EXAMINATION: ECOG PERFORMANCE STATUS: 2 - Symptomatic, <50% confined to bed  Vitals:   04/17/19 0829  BP: 134/78  Pulse: 91  Resp: 18  Temp: (!) 97.5 F (36.4 C)  SpO2: 98%   Filed Weights   04/17/19 0829  Weight: 133 lb 12.8 oz (60.7 kg)  .  Wt Readings from Last 3 Encounters:  04/17/19 133 lb 12.8 oz (60.7 kg)  04/15/19 135 lb 6.4 oz (61.4 kg)  04/03/19 136 lb 8 oz (61.9 kg)   GENERAL:alert, in no acute distress and comfortable SKIN: no acute rashes, no significant lesions EYES: conjunctiva are pink and non-injected, sclera anicteric OROPHARYNX: MMM, no exudates, no oropharyngeal erythema or ulceration NECK: supple, no JVD LYMPH:  no palpable lymphadenopathy in the cervical, axillary or inguinal regions LUNGS: clear to auscultation b/l with normal respiratory effort HEART: regular rate & rhythm ABDOMEN:  normoactive bowel sounds , non tender, not distended. No palpable hepatosplenomegaly.  Extremity: no pedal edema PSYCH: alert & oriented x 3 with fluent speech NEURO: no focal motor/sensory deficits   LABORATORY DATA:  I have reviewed the data as listed  . CBC Latest Ref Rng & Units 04/17/2019 04/10/2019 04/03/2019  WBC 4.0 - 10.5 K/uL 4.8 3.9(L) 3.6(L)  Hemoglobin 12.0 - 15.0 g/dL 9.7(L) 8.7(L) 9.4(L)  Hematocrit 36.0 - 46.0 % 31.1(L) 27.9(L) 30.2(L)  Platelets 150 - 400 K/uL 180 169 127(L)   ANC 1.8k . CMP Latest Ref Rng & Units 04/17/2019 04/10/2019 04/03/2019  Glucose 70 - 99 mg/dL 97 149(H) 122(H)  BUN 8 - 23 mg/dL _0 Creatinine 0.44 - 1.00 mg/dL 0.98 0.97 0.88  Sodium 135 - 145 mmol/L 137 140 136  Potassium 3.5 - 5.1 mmol/L 3.3(L) 3.8 3.3(L)  Chloride 98 - 111 mmol/L 105 112(H) 107  CO2 22 - 32 mmol/L 23 19(L) 22  Calcium 8.9 - 10.3 mg/dL 7.9(L) 7.2(L) 8.0(L)  Total  Protein 6.5 - 8.1 g/dL 6.4(L) 6.1(L) 6.0(L)  Total Bilirubin 0.3 - 1.2 mg/dL 0.5 0.5 0.6  Alkaline Phos 38 - 126 U/L 78 75 60  AST 15 - 41 U/L _1 ALT 0 - 44 U/L _2 01/02/19 Esophagus Biopsy:    RADIOGRAPHIC STUDIES:  .Dg Chest 2 View  Result Date: 04/16/2019 CLINICAL DATA:  Dyspnea on exertion EXAM: CHEST - 2 VIEW COMPARISON:  03/06/2019 FINDINGS: COPD with hyperinflation of the lungs and scarring in the bases. No superimposed acute infiltrate or effusion. Negative for heart failure. Heart size is normal. Atherosclerotic disease in the aortic arch. Port-A-Cath tip in the SVC in good position. IMPRESSION: COPD and scarring. No superimposed acute abnormality and no interval change from the prior study. Electronically Signed   By: Franchot Gallo M.D.   On: 04/16/2019 07:46    ASSESSMENT & PLAN:   78 y.o. female with  #1 Metastatic poorly differentiated carcinoma with likely lung primary non-small cell lung cancer.   CT of the head with and without contrast showed no evidence of metastatic disease. EGFR blood test mutation analysis negative. CT chest abdomen pelvis 04/19/2016 shows no evidence of disease progression. Patient tolerated Nivolumab very well but was discontinued when she developed grade 2  Immune colitis. Has been off Nivolumab for >6 months  CT chest abdomen pelvis on 06/24/2016 shows no evidence of new disease or progression of metastatic disease. CT chest abdomen pelvis 09/06/2016 shows 1. Mixed interval response to therapy. 2. There is a new left ventral chest wall lesion deep to the pectoralis musculature worrisome for metastatic disease. 3. Posterior lower lobe nodular densities are identified which may reflect areas of pulmonary metastasis. 4. Interval decrease in size of destructive lesion involving the left iliac bone.  CT chest abd pelvis 12/08/2016: Cystic mass involving the left ventral chest wall has resolved in the interval. Likely was a hematoma  due to trauma. Interval increase in size of pleural base mass overlying the posterior and inferior left lower lobe. There is also a new left pleural effusion identified.  CT chest 02/01/2017: Residual irregular soft tissue thickening/volume loss and trace left pleural fluid at the base of the left hemithorax, overall improved in appearance from 12/08/2016. No measurable lesion.  CT chest 05/29/2017 shows no residual pleural based mass or significant pleural effusion in the left hemithorax. No evidence of thoracic metastatic disease. No evidence of progressive metastatic disease within the abdomen or pelvis. Mixed lytic and blastic lesion involving the left iliac bone and associated pathologic fracture are unchanged.   CT CAP 09/14/17 shows no new changes. She does have slight displacement of her fractured left iliac bone. Evidence of stable disease.   CT CAP 01/04/2018- No new or progressive metastatic disease. Stable large left iliac bone metastasis with associated chronic pathologic fracture.   CT chest/abd/pelvis done on 04/26/18 revealed Stable exam.  No new or progressive interval findings.  07/19/18 CT C/A/P revealed Stable exam.  No new or progressive interval findings. Large destructive left iliac lesion is similar to prior. Aortic Atherosclerosis and Emphysema.    11/06/18 CT C/A/P revealed Similar appearance of large mixed lytic and sclerotic lesion in the left ilium. No new metastatic lesions are otherwise noted elsewhere in the chest, abdomen or pelvis. 2. Interval development of thickening of the distal third of the esophagus. This is nonspecific, and could be related to underlying reflux esophagitis. However, if there is any clinical concern for Barrett's metaplasia or esophageal neoplasia, further evaluation with nonemergent endoscopy could be considered. 3. Aortic atherosclerosis, in addition to left main coronary artery disease. Assessment for potential risk factor modification, dietary  therapy or pharmacologic therapy may be warranted, if clinically Indicated. 4. Diffuse bronchial wall thickening with mild to moderate centrilobular and paraseptal emphysema; imaging findings suggestive of underlying COPD. 5. Additional incidental findings, as above.   #2 Newly diagnosed Adenocarcinoma of the esophagus  Barrett's esophagus 4cms in the distal esophagus with low and high-grade dysplasia  01/02/19 Surgical pathology revealed adenocarcinoma of the esophagus   01/25/19 PET/CT revealed Distal esophageal primary, without hypermetabolic metastatic disease. 2. Chronic left iliac metastasis, as before. 3. Hypermetabolism within and superficial to the right gluteal musculature is most likely related to trauma and/or injection sites. 4. Aortic atherosclerosis, coronary artery atherosclerosis and emphysema.  S/p concurrent Carboplatin and Taxol weekly with RT of 45 Gy in 25 fractions and 5.4 Gy boost, completed between 02/04/19 and 03/27/19  #3 diarrhea-  now resolved was previously. 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 She was previously on on Lialda, budesonide,probiotics and lomotil but not currently taking any of these. Plan -Continue Sandostatin every 4 weeks   #  4 h/o diverticulitis and c diff colitis - now resolved Plan  -continue on sandostatin today and q4weeks.  -continue on Lialda  #5 DVT and PE  -continue on Xarelto - no issues with bleeding   #6 Dsypnea 03/14/19 ECHO revealed LV EF of 60-65% 03/06/19 CXR revealed clear lung fields, normal heart size 03/07/19 and 02/25/19 EKGs, no overt concern but some decreased QRS amplitude Did refer to pulmonology for further evaluation and lung function testing  Began steroid inhaler to mitigate possible inflammation in airway, could be some radiation related scarring and emphysema  PLAN: -Discussed pt labwork today,  04/17/19; WBC normal, PLT normalized, HGB improved to 9.7. Phosphorous and Magnesium improved. -Continue PO Magnesium and Potassium replacement -Continued to emphasize getting up and about safely, eating better, and keeping well hydrated -Continue Marinol for appetite stimulation -Proceed with IVF and will continue IVF at home 3 times per week -Low dose dexamethasone as well -Begin incentive spirometer -Will continue monthly Sandostatin -Hold Amlodipine as BP has been low and pt has been eating worse -Follow up with home health services -Continue with Xgeva every 4 weeks, no dental concerns at this time -Continue 2000 units Vitamin D every day, with dose adjustment to maintain 25OH Vit D levels of 40-60 -Trazodone for sleep difficulty -Will see the pt back in 2 weeks   RTC with Dr Irene Limbo with labs In 2 weeks Continue IVF at home   All questions were answered. The patient knows to call the clinic with any problems, questions or concerns.  The total time spent in the appt was 25 minutes and more than 50% was on counseling and direct patient cares.   Sullivan Lone MD [place] Hematology/Oncology Physician [facility]  (Office): [phone] (Work cell): [phone] (Fax): [phone]  I, Baldwin [place], am acting as a scribe for Dr. Sullivan Lone.   .I have reviewed the above documentation for accuracy and completeness, and I agree with the above. [name] MD

## 2019-04-16 NOTE — Telephone Encounter (Signed)
  Radiation Oncology         [phone] ________________________________  Name: Cindy Byrd MRN: [id number]  Date of Service: 04/16/2019  DOB: Dec 26, 1940  Post Treatment Telephone Note  Diagnosis:  Adenocarcinoma of the distal esophagus.  Interval Since Last Radiation:  4 weeks   02/04/2019-03/22/2019:  The lower third esophagus was treated to 50 Gy in 25 fractions and a 6 Gy boost over 3 fractions was also given to total 56 Gy.  07/15/15-07/28/15: 30 Gy in 10 fractions to the left iliac bone  Narrative:  The patient was contacted today for routine follow-up. During treatment she did very well with radiotherapy and did not have significant desquamation. She did have a disruption in her treatment due to progressive shortness of breath and was seen on several occasions in the ED or asked to go to ED for evaluation. Fortunately she did not have COVID19, though that was one of the concerns during the work up. She continues to be short of breath and was seen by pulmonary and she is considered calling them back today as she feels worse than she did yesterday in the office. She reports her swallowing is improving. She continues to take carafate whole for her esophagitis, despite being counseled on crushing the tablet and making a slurry with water. She thinks her esophagitis is better however. No other complaints are noted.  Impression/Plan: 1. Adenocarcinoma of the distal esophagus. The patient has been doing well since completion of radiotherapy. We discussed that we would be happy to continue to follow her as needed, but she will also continue to follow up with Dr. Irene Limbo in medical oncology. She will also continue with routine EGD surveillance given that she was not a surgical candidate. She was encouraged to try crushing the carafate instead to make a slurry rather than taking the pill whole. I also suggested Mylanta and taking a tablespoon of cooking oil to form a temporary barrier when she eats.  2. History of Stage IV poorly differentiated carcinoma of the lung. The patient has done very well and continues in surveillance with Dr. Irene Limbo. We will see her as needed as above. 3. COPD with shortness of breath. The patient will continue to follow up with pulmonary for management of her symptoms.      Carola Rhine, PAC

## 2019-04-17 ENCOUNTER — Inpatient Hospital Stay (HOSPITAL_BASED_OUTPATIENT_CLINIC_OR_DEPARTMENT_OTHER): Payer: Medicare Other | Admitting: Hematology

## 2019-04-17 ENCOUNTER — Inpatient Hospital Stay: Payer: Medicare Other

## 2019-04-17 ENCOUNTER — Other Ambulatory Visit: Payer: Self-pay

## 2019-04-17 ENCOUNTER — Other Ambulatory Visit: Payer: Medicare Other

## 2019-04-17 ENCOUNTER — Encounter: Payer: Self-pay | Admitting: Internal Medicine

## 2019-04-17 VITALS — BP 134/78 | HR 91 | Temp 97.5°F | Resp 18 | Ht 68.5 in | Wt 133.8 lb

## 2019-04-17 VITALS — BP 160/76 | Temp 98.1°F | Resp 18

## 2019-04-17 DIAGNOSIS — C155 Malignant neoplasm of lower third of esophagus: Secondary | ICD-10-CM

## 2019-04-17 DIAGNOSIS — K529 Noninfective gastroenteritis and colitis, unspecified: Secondary | ICD-10-CM

## 2019-04-17 DIAGNOSIS — R197 Diarrhea, unspecified: Secondary | ICD-10-CM

## 2019-04-17 DIAGNOSIS — K521 Toxic gastroenteritis and colitis: Secondary | ICD-10-CM

## 2019-04-17 DIAGNOSIS — Z7189 Other specified counseling: Secondary | ICD-10-CM

## 2019-04-17 DIAGNOSIS — E860 Dehydration: Secondary | ICD-10-CM

## 2019-04-17 DIAGNOSIS — C774 Secondary and unspecified malignant neoplasm of inguinal and lower limb lymph nodes: Secondary | ICD-10-CM | POA: Diagnosis not present

## 2019-04-17 DIAGNOSIS — C3490 Malignant neoplasm of unspecified part of unspecified bronchus or lung: Secondary | ICD-10-CM

## 2019-04-17 DIAGNOSIS — Z95828 Presence of other vascular implants and grafts: Secondary | ICD-10-CM

## 2019-04-17 DIAGNOSIS — E876 Hypokalemia: Secondary | ICD-10-CM

## 2019-04-17 DIAGNOSIS — C3491 Malignant neoplasm of unspecified part of right bronchus or lung: Secondary | ICD-10-CM | POA: Diagnosis not present

## 2019-04-17 DIAGNOSIS — I82401 Acute embolism and thrombosis of unspecified deep veins of right lower extremity: Secondary | ICD-10-CM

## 2019-04-17 DIAGNOSIS — C7951 Secondary malignant neoplasm of bone: Secondary | ICD-10-CM | POA: Diagnosis not present

## 2019-04-17 DIAGNOSIS — I2699 Other pulmonary embolism without acute cor pulmonale: Secondary | ICD-10-CM

## 2019-04-17 DIAGNOSIS — R0609 Other forms of dyspnea: Secondary | ICD-10-CM

## 2019-04-17 LAB — CBC WITH DIFFERENTIAL/PLATELET
Abs Immature Granulocytes: 0.02 10*3/uL (ref 0.00–0.07)
Basophils Absolute: 0 10*3/uL (ref 0.0–0.1)
Basophils Relative: 0 %
Eosinophils Absolute: 0.1 10*3/uL (ref 0.0–0.5)
Eosinophils Relative: 2 %
HCT: 31.1 % — ABNORMAL LOW (ref 36.0–46.0)
Hemoglobin: 9.7 g/dL — ABNORMAL LOW (ref 12.0–15.0)
Immature Granulocytes: 0 %
Lymphocytes Relative: 21 %
Lymphs Abs: 1 10*3/uL (ref 0.7–4.0)
MCH: 27.6 pg (ref 26.0–34.0)
MCHC: 31.2 g/dL (ref 30.0–36.0)
MCV: 88.6 fL (ref 80.0–100.0)
Monocytes Absolute: 0.6 10*3/uL (ref 0.1–1.0)
Monocytes Relative: 13 %
Neutro Abs: 3.1 10*3/uL (ref 1.7–7.7)
Neutrophils Relative %: 64 %
Platelets: 180 10*3/uL (ref 150–400)
RBC: 3.51 MIL/uL — ABNORMAL LOW (ref 3.87–5.11)
RDW: 21.8 % — ABNORMAL HIGH (ref 11.5–15.5)
WBC: 4.8 10*3/uL (ref 4.0–10.5)
nRBC: 0 % (ref 0.0–0.2)

## 2019-04-17 LAB — CMP (CANCER CENTER ONLY)
ALT: 10 U/L (ref 0–44)
AST: 23 U/L (ref 15–41)
Albumin: 2.6 g/dL — ABNORMAL LOW (ref 3.5–5.0)
Alkaline Phosphatase: 78 U/L (ref 38–126)
Anion gap: 9 (ref 5–15)
BUN: 13 mg/dL (ref 8–23)
CO2: 23 mmol/L (ref 22–32)
Calcium: 7.9 mg/dL — ABNORMAL LOW (ref 8.9–10.3)
Chloride: 105 mmol/L (ref 98–111)
Creatinine: 0.98 mg/dL (ref 0.44–1.00)
GFR, Est AFR Am: >60 mL/min (ref >60)
GFR, Estimated: 55 mL/min — ABNORMAL LOW (ref >60)
Glucose, Bld: 97 mg/dL (ref 70–99)
Potassium: 3.3 mmol/L — ABNORMAL LOW (ref 3.5–5.1)
Sodium: 137 mmol/L (ref 135–145)
Total Bilirubin: 0.5 mg/dL (ref 0.3–1.2)
Total Protein: 6.4 g/dL — ABNORMAL LOW (ref 6.5–8.1)

## 2019-04-17 LAB — PHOSPHORUS: Phosphorus: 2.7 mg/dL (ref 2.5–4.6)

## 2019-04-17 LAB — MAGNESIUM: Magnesium: 1.5 mg/dL — ABNORMAL LOW (ref 1.7–2.4)

## 2019-04-17 MED ORDER — SODIUM CHLORIDE 0.9% FLUSH
10.0000 mL | Freq: Once | INTRAVENOUS | Status: AC
  Administered 2019-04-17: 08:00:00 10 mL
  Filled 2019-04-17: qty 10

## 2019-04-17 MED ORDER — KCL IN DEXTROSE-NACL 20-5-0.9 MEQ/L-%-% IV SOLN: Freq: Once | INTRAVENOUS | Status: DC

## 2019-04-17 MED ORDER — HEPARIN SOD (PORK) LOCK FLUSH 100 UNIT/ML IV SOLN
500.0000 [IU] | Freq: Once | INTRAVENOUS | Status: AC
  Administered 2019-04-17: 500 [IU]
  Filled 2019-04-17: qty 5

## 2019-04-17 MED ORDER — SODIUM CHLORIDE 0.9% FLUSH
10.0000 mL | Freq: Once | INTRAVENOUS | Status: AC
  Administered 2019-04-17: 10 mL
  Filled 2019-04-17: qty 10

## 2019-04-17 MED ORDER — POTASSIUM CHLORIDE 2 MEQ/ML IV SOLN
Freq: Once | INTRAVENOUS | Status: AC
  Administered 2019-04-17: 10:00:00 via INTRAVENOUS
  Filled 2019-04-17: qty 1000

## 2019-04-17 NOTE — Progress Notes (Signed)
Pt. Here today to receive IV fluids because labs today showed low potassium 3.3 and low magnesium 1.5   D5 with magnesium and potassium infused per Dr. Irene Limbo. Pt. tolerated infusion well AVS given with information on foods that pertain magnesium and potassium. No further problems or concerns noted.

## 2019-04-17 NOTE — Patient Instructions (Signed)

## 2019-04-17 NOTE — Patient Instructions (Addendum)
Rehydration, Adult Rehydration is the replacement of body fluids and salts and minerals (electrolytes) that are lost during dehydration. Dehydration is when there is not enough fluid or water in the body. This happens when you lose more fluids than you take in. Common causes of dehydration include:  Vomiting.  Diarrhea.  Excessive sweating, such as from heat exposure or exercise.  Taking medicines that cause the body to lose excess fluid (diuretics).  Impaired kidney function.  Not drinking enough fluid.  Certain illnesses or infections.  Certain poorly controlled long-term (chronic) illnesses, such as diabetes, heart disease, and kidney disease.  Symptoms of mild dehydration may include thirst, dry lips and mouth, dry skin, and dizziness. Symptoms of severe dehydration may include increased heart rate, confusion, fainting, and not urinating. You can rehydrate by drinking certain fluids or getting fluids through an IV tube, as told by your health care provider. What are the risks? Generally, rehydration is safe. However, one problem that can happen is taking in too much fluid (overhydration). This is rare. If overhydration happens, it can cause an electrolyte imbalance, kidney failure, or a decrease in salt (sodium) levels in the body. How to rehydrate Follow instructions from your health care provider for rehydration. The kind of fluid you should drink and the amount you should drink depend on your condition.  If directed by your health care provider, drink an oral rehydration solution (ORS). This is a drink designed to treat dehydration that is found in pharmacies and retail stores. ? Make an ORS by following instructions on the package. ? Start by drinking small amounts, about  cup (120 mL) every 5-10 minutes. ? Slowly increase how much you drink until you have taken the amount recommended by your health care provider.  Drink enough clear fluids to keep your urine clear or pale  yellow. If you were instructed to drink an ORS, finish the ORS first, then start slowly drinking other clear fluids. Drink fluids such as: ? Water. Do not drink only water. Doing that can lead to having too little sodium in your body (hyponatremia). ? Ice chips. ? Fruit juice that you have added water to (diluted juice). ? Low-calorie sports drinks.  If you are severely dehydrated, your health care provider may recommend that you receive fluids through an IV tube in the hospital.  Do not take sodium tablets. Doing that can lead to the condition of having too much sodium in your body (hypernatremia). Eating while you rehydrate Follow instructions from your health care provider about what to eat while you rehydrate. Your health care provider may recommend that you slowly begin eating regular foods in small amounts.  Eat foods that contain a healthy balance of electrolytes, such as bananas, oranges, potatoes, tomatoes, and spinach.  Avoid foods that are greasy or contain a lot of fat or sugar.  In some cases, you may get nutrition through a feeding tube that is passed through your nose and into your stomach (nasogastric tube, or NG tube). This may be done if you have uncontrolled vomiting or diarrhea. Beverages to avoid Certain beverages may make dehydration worse. While you rehydrate, avoid:  Alcohol.  Caffeine.  Drinks that contain a lot of sugar. These include: ? High-calorie sports drinks. ? Fruit juice that is not diluted. ? Soda.  Check nutrition labels to see how much sugar or caffeine a beverage contains. Signs of dehydration recovery You may be recovering from dehydration if:  You are urinating more often than before you started  rehydrating.  Your urine is clear or pale yellow.  Your energy level improves.  You vomit less frequently.  You have diarrhea less frequently.  Your appetite improves or returns to normal.  You feel less dizzy or less light-headed.  Your  skin tone and color start to look more normal. Contact a health care provider if:  You continue to have symptoms of mild dehydration, such as: ? Thirst. ? Dry lips. ? Slightly dry mouth. ? Dry, warm skin. ? Dizziness.  You continue to vomit or have diarrhea. Get help right away if:  You have symptoms of dehydration that get worse.  You feel: ? Confused. ? Weak. ? Like you are going to faint.  You have not urinated in 6-8 hours.  You have very dark urine.  You have trouble breathing.  Your heart rate while sitting still is over 100 beats a minute.  You cannot drink fluids without vomiting.  You have vomiting or diarrhea that: ? Gets worse. ? Does not go away.  You have a fever. This information is not intended to replace advice given to you by your health care provider. Make sure you discuss any questions you have with your health care provider. Document Released: 02/13/2012 Document Revised: 06/10/2016 Document Reviewed: 01/15/2016 Elsevier Interactive Patient Education  2019 [employer].  Hypokalemia Hypokalemia means that the amount of potassium in the blood is lower than normal.Potassium is a chemical that helps regulate the amount of fluid in the body (electrolyte). It also stimulates muscle tightening (contraction) and helps nerves work properly.Normally, most of the bodys potassium is inside of cells, and only a very small amount is in the blood. Because the amount in the blood is so small, minor changes to potassium levels in the blood can be life-threatening. What are the causes? This condition may be caused by:  Antibiotic medicine.  Diarrhea or vomiting. Taking too much of a medicine that helps you have a bowel movement (laxative) can cause diarrhea and lead to hypokalemia.  Chronic kidney disease (CKD).  Medicines that help the body get rid of excess fluid (diuretics).  Eating disorders, such as bulimia.  Low magnesium levels in the  body.  Sweating a lot. What are the signs or symptoms? Symptoms of this condition include:  Weakness.  Constipation.  Fatigue.  Muscle cramps.  Mental confusion.  Skipped heartbeats or irregular heartbeat (palpitations).  Tingling or numbness. How is this diagnosed? This condition is diagnosed with a blood test. How is this treated? Hypokalemia can be treated by taking potassium supplements by mouth or adjusting the medicines that you take. Treatment may also include eating more foods that contain a lot of potassium. If your potassium level is very low, you may need to get potassium through an IV tube in one of your veins and be monitored in the hospital. Follow these instructions at home:   Take over-the-counter and prescription medicines only as told by your health care provider. This includes vitamins and supplements.  Eat a healthy diet. A healthy diet includes fresh fruits and vegetables, whole grains, healthy fats, and lean proteins.  If instructed, eat more foods that contain a lot of potassium, such as: ? Nuts, such as peanuts and pistachios. ? Seeds, such as sunflower seeds and pumpkin seeds. ? Peas, lentils, and lima beans. ? Whole grain and bran cereals and breads. ? Fresh fruits and vegetables, such as apricots, avocado, bananas, cantaloupe, kiwi, oranges, tomatoes, asparagus, and potatoes. ? Orange juice. ? Tomato juice. ?  Red meats. ? Yogurt.  Keep all follow-up visits as told by your health care provider. This is important. Contact a health care provider if:  You have weakness that gets worse.  You feel your heart pounding or racing.  You vomit.  You have diarrhea.  You have diabetes (diabetes mellitus) and you have trouble keeping your blood sugar (glucose) in your target range. Get help right away if:  You have chest pain.  You have shortness of breath.  You have vomiting or diarrhea that lasts for more than 2 days.  You faint. This  information is not intended to replace advice given to you by your health care provider. Make sure you discuss any questions you have with your health care provider. Document Released: 11/21/2005 Document Revised: 07/09/2016 Document Reviewed: 07/09/2016 Elsevier Interactive Patient Education  2019 [employer].  Hypomagnesemia Hypomagnesemia is a condition in which the level of magnesium in the blood is low. Magnesium is a mineral that is found in many foods. It is used in many different processes in the body. Hypomagnesemia can affect every organ in the body. In severe cases, it can cause life-threatening problems. What are the causes? This condition may be caused by: Not getting enough magnesium in your diet. Malnutrition. Problems with absorbing magnesium from the intestines. Dehydration. Alcohol abuse. Vomiting. Severe or chronic diarrhea. Some medicines, including medicines that make you urinate more (diuretics). Certain diseases, such as kidney disease, diabetes, celiac disease, and overactive thyroid. What are the signs or symptoms? Symptoms of this condition include: Loss of appetite. Nausea and vomiting. Involuntary shaking or trembling of a body part (tremor). Muscle weakness. Tingling in the arms and legs. Sudden tightening of muscles (muscle spasms). Confusion. Psychiatric issues, such as depression, irritability, or psychosis. A feeling of fluttering of the heart. Seizures. These symptoms are more severe if magnesium levels drop suddenly. How is this diagnosed? This condition may be diagnosed based on: Your symptoms and medical history. A physical exam. Blood and urine tests. How is this treated? Treatment depends on the cause and the severity of the condition. It may be treated with: A magnesium supplement. This can be taken in pill form. If the condition is severe, magnesium is usually given through an IV. Changes to your diet. You may be directed to eat foods  that have a lot of magnesium, such as green leafy vegetables, peas, beans, and nuts. Stopping any intake of alcohol. Follow these instructions at home:     Make sure that your diet includes foods with magnesium. Foods that have a lot of magnesium in them include: Green leafy vegetables, such as spinach and broccoli. Beans and peas. Nuts and seeds, such as almonds and sunflower seeds. Whole grains, such as whole grain bread and fortified cereals. Take magnesium supplements if your health care provider tells you to do that. Take them as directed. Take over-the-counter and prescription medicines only as told by your health care provider. Have your magnesium levels monitored as told by your health care provider. When you are active, drink fluids that contain electrolytes. Avoid drinking alcohol. Keep all follow-up visits as told by your health care provider. This is important. Contact a health care provider if: You get worse instead of better. Your symptoms return. Get help right away if you: Develop severe muscle weakness. Have trouble breathing. Feel that your heart is racing. Summary Hypomagnesemia is a condition in which the level of magnesium in the blood is low. Hypomagnesemia can affect every organ in the  body. Treatment may include eating more foods that contain magnesium, taking magnesium supplements, and not drinking alcohol. Have your magnesium levels monitored as told by your health care provider. This information is not intended to replace advice given to you by your health care provider. Make sure you discuss any questions you have with your health care provider. Document Released: 08/17/2005 Document Revised: 10/23/2017 Document Reviewed: 10/23/2017 Elsevier Interactive Patient Education  2019 [employer].

## 2019-04-18 ENCOUNTER — Telehealth: Payer: Self-pay | Admitting: *Deleted

## 2019-04-18 ENCOUNTER — Telehealth: Payer: Self-pay | Admitting: Hematology

## 2019-04-18 NOTE — Telephone Encounter (Signed)
Appt calendar mailed

## 2019-04-18 NOTE — Telephone Encounter (Signed)
Scheduled appt per 5/13 los.  A calendar will be mailed out, per patient request.  Patient aware of appt date and time

## 2019-04-19 DIAGNOSIS — C3480 Malignant neoplasm of overlapping sites of unspecified bronchus and lung: Secondary | ICD-10-CM | POA: Diagnosis not present

## 2019-04-19 DIAGNOSIS — C160 Malignant neoplasm of cardia: Secondary | ICD-10-CM | POA: Diagnosis not present

## 2019-04-19 DIAGNOSIS — C7951 Secondary malignant neoplasm of bone: Secondary | ICD-10-CM | POA: Diagnosis not present

## 2019-04-19 DIAGNOSIS — C3490 Malignant neoplasm of unspecified part of unspecified bronchus or lung: Secondary | ICD-10-CM | POA: Diagnosis not present

## 2019-04-19 DIAGNOSIS — E860 Dehydration: Secondary | ICD-10-CM | POA: Diagnosis not present

## 2019-04-19 DIAGNOSIS — I2510 Atherosclerotic heart disease of native coronary artery without angina pectoris: Secondary | ICD-10-CM | POA: Diagnosis not present

## 2019-04-19 DIAGNOSIS — R918 Other nonspecific abnormal finding of lung field: Secondary | ICD-10-CM | POA: Diagnosis not present

## 2019-04-19 DIAGNOSIS — K2270 Barrett's esophagus without dysplasia: Secondary | ICD-10-CM | POA: Diagnosis not present

## 2019-04-19 DIAGNOSIS — J432 Centrilobular emphysema: Secondary | ICD-10-CM | POA: Diagnosis not present

## 2019-04-19 DIAGNOSIS — Z7901 Long term (current) use of anticoagulants: Secondary | ICD-10-CM | POA: Diagnosis not present

## 2019-04-19 DIAGNOSIS — I10 Essential (primary) hypertension: Secondary | ICD-10-CM | POA: Diagnosis not present

## 2019-04-19 DIAGNOSIS — E559 Vitamin D deficiency, unspecified: Secondary | ICD-10-CM | POA: Diagnosis not present

## 2019-04-19 DIAGNOSIS — K589 Irritable bowel syndrome without diarrhea: Secondary | ICD-10-CM | POA: Diagnosis not present

## 2019-04-19 DIAGNOSIS — K219 Gastro-esophageal reflux disease without esophagitis: Secondary | ICD-10-CM | POA: Diagnosis not present

## 2019-04-19 DIAGNOSIS — I700 Atherosclerosis of aorta: Secondary | ICD-10-CM | POA: Diagnosis not present

## 2019-04-19 DIAGNOSIS — J438 Other emphysema: Secondary | ICD-10-CM | POA: Diagnosis not present

## 2019-04-19 DIAGNOSIS — Z86711 Personal history of pulmonary embolism: Secondary | ICD-10-CM | POA: Diagnosis not present

## 2019-04-19 DIAGNOSIS — G893 Neoplasm related pain (acute) (chronic): Secondary | ICD-10-CM | POA: Diagnosis not present

## 2019-04-19 DIAGNOSIS — M84550D Pathological fracture in neoplastic disease, pelvis, subsequent encounter for fracture with routine healing: Secondary | ICD-10-CM | POA: Diagnosis not present

## 2019-04-19 DIAGNOSIS — E785 Hyperlipidemia, unspecified: Secondary | ICD-10-CM | POA: Diagnosis not present

## 2019-04-22 DIAGNOSIS — Z86711 Personal history of pulmonary embolism: Secondary | ICD-10-CM | POA: Diagnosis not present

## 2019-04-22 DIAGNOSIS — Z7901 Long term (current) use of anticoagulants: Secondary | ICD-10-CM | POA: Diagnosis not present

## 2019-04-22 DIAGNOSIS — G893 Neoplasm related pain (acute) (chronic): Secondary | ICD-10-CM | POA: Diagnosis not present

## 2019-04-22 DIAGNOSIS — E559 Vitamin D deficiency, unspecified: Secondary | ICD-10-CM | POA: Diagnosis not present

## 2019-04-22 DIAGNOSIS — K589 Irritable bowel syndrome without diarrhea: Secondary | ICD-10-CM | POA: Diagnosis not present

## 2019-04-22 DIAGNOSIS — E785 Hyperlipidemia, unspecified: Secondary | ICD-10-CM | POA: Diagnosis not present

## 2019-04-22 DIAGNOSIS — C160 Malignant neoplasm of cardia: Secondary | ICD-10-CM | POA: Diagnosis not present

## 2019-04-22 DIAGNOSIS — I2510 Atherosclerotic heart disease of native coronary artery without angina pectoris: Secondary | ICD-10-CM | POA: Diagnosis not present

## 2019-04-22 DIAGNOSIS — M84550D Pathological fracture in neoplastic disease, pelvis, subsequent encounter for fracture with routine healing: Secondary | ICD-10-CM | POA: Diagnosis not present

## 2019-04-22 DIAGNOSIS — J432 Centrilobular emphysema: Secondary | ICD-10-CM | POA: Diagnosis not present

## 2019-04-22 DIAGNOSIS — C7951 Secondary malignant neoplasm of bone: Secondary | ICD-10-CM | POA: Diagnosis not present

## 2019-04-22 DIAGNOSIS — K219 Gastro-esophageal reflux disease without esophagitis: Secondary | ICD-10-CM | POA: Diagnosis not present

## 2019-04-22 DIAGNOSIS — K2270 Barrett's esophagus without dysplasia: Secondary | ICD-10-CM | POA: Diagnosis not present

## 2019-04-22 DIAGNOSIS — R918 Other nonspecific abnormal finding of lung field: Secondary | ICD-10-CM | POA: Diagnosis not present

## 2019-04-22 DIAGNOSIS — I700 Atherosclerosis of aorta: Secondary | ICD-10-CM | POA: Diagnosis not present

## 2019-04-22 DIAGNOSIS — C3490 Malignant neoplasm of unspecified part of unspecified bronchus or lung: Secondary | ICD-10-CM | POA: Diagnosis not present

## 2019-04-22 DIAGNOSIS — I10 Essential (primary) hypertension: Secondary | ICD-10-CM | POA: Diagnosis not present

## 2019-04-22 DIAGNOSIS — J438 Other emphysema: Secondary | ICD-10-CM | POA: Diagnosis not present

## 2019-04-24 DIAGNOSIS — C3490 Malignant neoplasm of unspecified part of unspecified bronchus or lung: Secondary | ICD-10-CM | POA: Diagnosis not present

## 2019-04-24 DIAGNOSIS — K2270 Barrett's esophagus without dysplasia: Secondary | ICD-10-CM | POA: Diagnosis not present

## 2019-04-24 DIAGNOSIS — R918 Other nonspecific abnormal finding of lung field: Secondary | ICD-10-CM | POA: Diagnosis not present

## 2019-04-24 DIAGNOSIS — Z7901 Long term (current) use of anticoagulants: Secondary | ICD-10-CM | POA: Diagnosis not present

## 2019-04-24 DIAGNOSIS — E785 Hyperlipidemia, unspecified: Secondary | ICD-10-CM | POA: Diagnosis not present

## 2019-04-24 DIAGNOSIS — K589 Irritable bowel syndrome without diarrhea: Secondary | ICD-10-CM | POA: Diagnosis not present

## 2019-04-24 DIAGNOSIS — G893 Neoplasm related pain (acute) (chronic): Secondary | ICD-10-CM | POA: Diagnosis not present

## 2019-04-24 DIAGNOSIS — J432 Centrilobular emphysema: Secondary | ICD-10-CM | POA: Diagnosis not present

## 2019-04-24 DIAGNOSIS — J438 Other emphysema: Secondary | ICD-10-CM | POA: Diagnosis not present

## 2019-04-24 DIAGNOSIS — Z86711 Personal history of pulmonary embolism: Secondary | ICD-10-CM | POA: Diagnosis not present

## 2019-04-24 DIAGNOSIS — M84550D Pathological fracture in neoplastic disease, pelvis, subsequent encounter for fracture with routine healing: Secondary | ICD-10-CM | POA: Diagnosis not present

## 2019-04-24 DIAGNOSIS — I700 Atherosclerosis of aorta: Secondary | ICD-10-CM | POA: Diagnosis not present

## 2019-04-24 DIAGNOSIS — I10 Essential (primary) hypertension: Secondary | ICD-10-CM | POA: Diagnosis not present

## 2019-04-24 DIAGNOSIS — C7951 Secondary malignant neoplasm of bone: Secondary | ICD-10-CM | POA: Diagnosis not present

## 2019-04-24 DIAGNOSIS — C160 Malignant neoplasm of cardia: Secondary | ICD-10-CM | POA: Diagnosis not present

## 2019-04-24 DIAGNOSIS — I2510 Atherosclerotic heart disease of native coronary artery without angina pectoris: Secondary | ICD-10-CM | POA: Diagnosis not present

## 2019-04-24 DIAGNOSIS — E559 Vitamin D deficiency, unspecified: Secondary | ICD-10-CM | POA: Diagnosis not present

## 2019-04-24 DIAGNOSIS — K219 Gastro-esophageal reflux disease without esophagitis: Secondary | ICD-10-CM | POA: Diagnosis not present

## 2019-04-26 DIAGNOSIS — Z86711 Personal history of pulmonary embolism: Secondary | ICD-10-CM | POA: Diagnosis not present

## 2019-04-26 DIAGNOSIS — C7951 Secondary malignant neoplasm of bone: Secondary | ICD-10-CM | POA: Diagnosis not present

## 2019-04-26 DIAGNOSIS — C3490 Malignant neoplasm of unspecified part of unspecified bronchus or lung: Secondary | ICD-10-CM | POA: Diagnosis not present

## 2019-04-26 DIAGNOSIS — I2510 Atherosclerotic heart disease of native coronary artery without angina pectoris: Secondary | ICD-10-CM | POA: Diagnosis not present

## 2019-04-26 DIAGNOSIS — Z7901 Long term (current) use of anticoagulants: Secondary | ICD-10-CM | POA: Diagnosis not present

## 2019-04-26 DIAGNOSIS — E785 Hyperlipidemia, unspecified: Secondary | ICD-10-CM | POA: Diagnosis not present

## 2019-04-26 DIAGNOSIS — R918 Other nonspecific abnormal finding of lung field: Secondary | ICD-10-CM | POA: Diagnosis not present

## 2019-04-26 DIAGNOSIS — K2270 Barrett's esophagus without dysplasia: Secondary | ICD-10-CM | POA: Diagnosis not present

## 2019-04-26 DIAGNOSIS — J432 Centrilobular emphysema: Secondary | ICD-10-CM | POA: Diagnosis not present

## 2019-04-26 DIAGNOSIS — K589 Irritable bowel syndrome without diarrhea: Secondary | ICD-10-CM | POA: Diagnosis not present

## 2019-04-26 DIAGNOSIS — I700 Atherosclerosis of aorta: Secondary | ICD-10-CM | POA: Diagnosis not present

## 2019-04-26 DIAGNOSIS — M84550D Pathological fracture in neoplastic disease, pelvis, subsequent encounter for fracture with routine healing: Secondary | ICD-10-CM | POA: Diagnosis not present

## 2019-04-26 DIAGNOSIS — J438 Other emphysema: Secondary | ICD-10-CM | POA: Diagnosis not present

## 2019-04-26 DIAGNOSIS — E559 Vitamin D deficiency, unspecified: Secondary | ICD-10-CM | POA: Diagnosis not present

## 2019-04-26 DIAGNOSIS — G893 Neoplasm related pain (acute) (chronic): Secondary | ICD-10-CM | POA: Diagnosis not present

## 2019-04-26 DIAGNOSIS — K219 Gastro-esophageal reflux disease without esophagitis: Secondary | ICD-10-CM | POA: Diagnosis not present

## 2019-04-26 DIAGNOSIS — C160 Malignant neoplasm of cardia: Secondary | ICD-10-CM | POA: Diagnosis not present

## 2019-04-26 DIAGNOSIS — I10 Essential (primary) hypertension: Secondary | ICD-10-CM | POA: Diagnosis not present

## 2019-04-30 ENCOUNTER — Other Ambulatory Visit: Payer: Self-pay | Admitting: Hematology

## 2019-04-30 DIAGNOSIS — I2510 Atherosclerotic heart disease of native coronary artery without angina pectoris: Secondary | ICD-10-CM | POA: Diagnosis not present

## 2019-04-30 DIAGNOSIS — C160 Malignant neoplasm of cardia: Secondary | ICD-10-CM | POA: Diagnosis not present

## 2019-04-30 DIAGNOSIS — Z7901 Long term (current) use of anticoagulants: Secondary | ICD-10-CM | POA: Diagnosis not present

## 2019-04-30 DIAGNOSIS — E559 Vitamin D deficiency, unspecified: Secondary | ICD-10-CM | POA: Diagnosis not present

## 2019-04-30 DIAGNOSIS — C7951 Secondary malignant neoplasm of bone: Secondary | ICD-10-CM | POA: Diagnosis not present

## 2019-04-30 DIAGNOSIS — C3490 Malignant neoplasm of unspecified part of unspecified bronchus or lung: Secondary | ICD-10-CM

## 2019-04-30 DIAGNOSIS — K2270 Barrett's esophagus without dysplasia: Secondary | ICD-10-CM | POA: Diagnosis not present

## 2019-04-30 DIAGNOSIS — J438 Other emphysema: Secondary | ICD-10-CM | POA: Diagnosis not present

## 2019-04-30 DIAGNOSIS — G893 Neoplasm related pain (acute) (chronic): Secondary | ICD-10-CM | POA: Diagnosis not present

## 2019-04-30 DIAGNOSIS — I700 Atherosclerosis of aorta: Secondary | ICD-10-CM | POA: Diagnosis not present

## 2019-04-30 DIAGNOSIS — J432 Centrilobular emphysema: Secondary | ICD-10-CM | POA: Diagnosis not present

## 2019-04-30 DIAGNOSIS — K589 Irritable bowel syndrome without diarrhea: Secondary | ICD-10-CM | POA: Diagnosis not present

## 2019-04-30 DIAGNOSIS — I10 Essential (primary) hypertension: Secondary | ICD-10-CM | POA: Diagnosis not present

## 2019-04-30 DIAGNOSIS — C155 Malignant neoplasm of lower third of esophagus: Secondary | ICD-10-CM

## 2019-04-30 DIAGNOSIS — E876 Hypokalemia: Secondary | ICD-10-CM

## 2019-04-30 DIAGNOSIS — R918 Other nonspecific abnormal finding of lung field: Secondary | ICD-10-CM | POA: Diagnosis not present

## 2019-04-30 DIAGNOSIS — E785 Hyperlipidemia, unspecified: Secondary | ICD-10-CM | POA: Diagnosis not present

## 2019-04-30 DIAGNOSIS — K219 Gastro-esophageal reflux disease without esophagitis: Secondary | ICD-10-CM | POA: Diagnosis not present

## 2019-04-30 DIAGNOSIS — Z86711 Personal history of pulmonary embolism: Secondary | ICD-10-CM | POA: Diagnosis not present

## 2019-04-30 DIAGNOSIS — M84550D Pathological fracture in neoplastic disease, pelvis, subsequent encounter for fracture with routine healing: Secondary | ICD-10-CM | POA: Diagnosis not present

## 2019-04-30 NOTE — Progress Notes (Signed)
HEMATOLOGY ONCOLOGY PROGRESS NOTE  Date of service:  05/01/19    Patient Care Team: [name], NP as PCP - General (Family Medicine) [name], MD as Consulting Physician (Hematology and Oncology)  CHIEF COMPLAINTS/PURPOSE OF CONSULTATION:  F/u for metastatic lung cancer  DIAGNOSIS:   #1 Metastatic non-small cell lung cancer with bilateral lung nodules and large metastatic lesion in the left Ilium. 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 Also had c diff colitis - treated   Current Treatment  1) Active surveillance 2) Xgeva 166m [place] q4weeks for bone metastases. 3) Sandostatin q4weeks for diarrhea - immune colitis  Previous Treatment  1 Palliative radiation therapy to the large left ilium metastases 2. IV Nivolumab x 20ycles (discontinued due to likely immune colitis) 3. Xgeva 1212mSC q4weeks for bone metastases.   HISTORY OF PRESENTING ILLNESS: (plz see my previous consultation for details of initial presentation)  INTERVAL HISTORY:   Cindy ToGaneshs here for her scheduled follow-up for metastatic lung cancer, and adenocarcinoma of the esophagus. The patient's last visit with [place] on 04/17/19. The pt reports that she is doing well overall.  The pt reports that she has continued with IVF at home three times a week. She notes that she lost 6 pounds since our last visit two weeks ago, and notes that she is not eating a lot as "nothing really tastes good anymore." However she notes that she has been able to start 2.81m9marinol BID which has been helpful for her appetite stimulation. Despite this, she notes that she has been eating about a fourth of what she used to eat. She notes that she has been consuming 1 Ensure per day. She spoke with our nutritional therapist a few weeks ago. The pt denies any problems or difficulty with swallowing. She notes that she has also not had any nausea recently.  The  pt notes that she has been able to get around with home health.  Lab results today (05/01/19) of CBC w/diff and CMP is as follows: all values are WNL except for RBC at 3.65, HGB at 10.3, HCT at 33.4, RDW at 18.6, Glucose at 103, Calcium at 8.8, Albumin at 2.5, GFR at 54. 05/01/19 Magnesium at 1.6, Phosphorous at 4.5  On review of systems, pt reports somewhat improved appetite, weight loss, not eating well, and denies concerns for infections, difficulty swallowing, nausea, and any other symptoms.   MEDICAL HISTORY:  Past Medical History:  Diagnosis Date  . Barrett's esophagus   . Bone neoplasm 06/24/2015  . [facility]  metast[facility] differentiated carcinoma. tumor left groin surgical removal with radiation tx.  . Cataract    BILATERAL  . Cigarette smoker two packs a day or less    Currently still smoking 2 PPD - Not interested in quitting at this time.  . Colitis 2017  . Colon polyps    hyperplastic, tubular adenomas, tubulovillous adenoma  . Cough, persistent    hx. lung cancer ? primary-being evaluated, unsure of primary site.  . Depression 06/24/2015  . Diverticulosis   . [place]sema of lung ([facility] End[place]riosis    Hysterectomy with BSO at age 72 15s  . Esophageal adenocarcinoma ([facility]   intramucosal  . Gastritis   . GERD (gastroesophageal reflux disease)   . H/O: pneumonia   . Hiatal hernia   . Hyperlipidemia   . Hypertension 06/24/2015   likely  improved incidental to 40 lbs weight loss from her neoplasm. No Longer taking med for this as of 08-06-15  . IBS (irritable bowel syndrome)   . Pain    left hip-persistent"tumor of bone"-radiation tx. 10.  . Vitamin D deficiency disease    SURGICAL HISTORY: Past Surgical History:  Procedure Laterality Date  . BARTHOLIN GLAND CYST EXCISION  78 yo ago   Does not want if it was an infected cyst or tumor. Was soon as delivery  . BIOPSY  01/02/2019   Procedure: BIOPSY;  Surgeon: [name], MD;  Location: [facility];   Service: Gastroenterology;;  . CATARACT EXTRACTION    . COLONOSCOPY W/ POLYPECTOMY     multiple times - last done 09/2014 per patient.  . ESOPHAGOGASTRODUODENOSCOPY (EGD) WITH PROPOFOL N/A 08/11/2015   Procedure: ESOPHAGOGASTRODUODENOSCOPY (EGD) WITH PROPOFOL;  Surgeon: [name], MD;  Location: [facility];  Service: Gastroenterology;  Laterality: N/A;  . ESOPHAGOGASTRODUODENOSCOPY (EGD) WITH PROPOFOL N/A 01/02/2019   Procedure: ESOPHAGOGASTRODUODENOSCOPY (EGD) WITH PROPOFOL;  Surgeon: [name], MD;  Location: [facility];  Service: Gastroenterology;  Laterality: N/A;  . FLEXIBLE SIGMOIDOSCOPY N/A 06/24/2017   Procedure: FLEXIBLE SIGMOIDOSCOPY;  Surgeon: Manus Gunning, MD;  Location: [facility];  Service: Gastroenterology;  Laterality: N/A;  . GANGLION CYST EXCISION    . KNEE ARTHROSCOPY  age about 25 yrs  . TONSILLECTOMY    . TOTAL ABDOMINAL HYSTERECTOMY W/ BILATERAL SALPINGOOPHORECTOMY  at age 18 yrs   For endometriosis    SOCIAL HISTORY: Social History   Socioeconomic History  . Marital status: Widowed    Spouse name: Not on file  . Number of children: 2  . Years of education: Not on file  . Highest education level: Not on file  Occupational History  . Not on file  Social Needs  . Financial resource strain: Not on file  . Food insecurity:    Worry: Not on file    Inability: Not on file  . Transportation needs:    Medical: No    Non-medical: No  Tobacco Use  . Smoking status: Former Smoker    Packs/day: 1.00    Years: 60.00    Pack years: 60.00    Types: Cigarettes    Last attempt to quit: 12/05/2014    Years since quitting: 4.4  . Smokeless tobacco: Never Used  Substance and Sexual Activity  . Alcohol use: No    Alcohol/week: 0.0 standard drinks  . Drug use: No  . Sexual activity: Never  Lifestyle  . Physical activity:    Days per week: Not on file    Minutes per session: Not on file  . Stress: Not on file  Relationships  . Social  connections:    Talks on phone: Not on file    Gets together: Not on file    Attends religious service: Not on file    Active member of club or organization: Not on file    Attends meetings of clubs or organizations: Not on file    Relationship status: Not on file  . Intimate partner violence:    Fear of current or ex partner: Not on file    Emotionally abused: Not on file    Physically abused: Not on file    Forced sexual activity: Not on file  Other Topics Concern  . Not on file  Social History Narrative  . Not on file    FAMILY HISTORY: Family History  Problem Relation Age of Onset  .  Colon cancer Brother   . Colon cancer Brother   . Stroke Mother   . Colon cancer Father   . Emphysema Father        smoked  . Breast cancer Daughter 60       ER/PR+ stage II    ALLERGIES:  is allergic to penicillins; remeron [mirtazapine]; and latex. patient wonders if she has a penicillin allergy but notes that she is uncertain about this.  MEDICATIONS:  Current Outpatient Medications  Medication Sig Dispense Refill  . albuterol (PROVENTIL HFA;VENTOLIN HFA) 108 (90 Base) MCG/ACT inhaler Inhale 1 puff into the lungs every 6 (six) hours as needed for wheezing or shortness of breath. 1 Inhaler 1  . amitriptyline (ELAVIL) 25 MG tablet Take 25 mg by mouth at bedtime.     . Cholecalciferol (VITAMIN D3) 25 MCG (1000 UT) CAPS Take 1,000 Units by mouth daily.    [name] dexamethasone (DECADRON) 2 MG tablet Take 1 tablet (2 mg total) by mouth daily with breakfast. 30 tablet 0  . dronabinol (MARINOL) 5 MG capsule Take 1 capsule (5 mg total) by mouth 2 (two) times daily before a meal. 60 capsule 0  . FLUoxetine (PROZAC) 20 MG capsule Take 1 capsule (20 mg total) by mouth daily. 90 capsule 3  . KLOR-CON M20 20 MEQ tablet TAKE 1 TABLET BY MOUTH TWICE A DAY 60 tablet 1  . lidocaine-prilocaine (EMLA) cream Apply to affected area once (Patient taking differently: Apply 1 application topically as needed (port  access). Apply to affected area once) 30 g 3  . LORazepam (ATIVAN) 0.5 MG tablet Take 1 tablet (0.5 mg total) by mouth every 6 (six) hours as needed (Nausea or vomiting). 30 tablet 0  . magnesium oxide (MAG-OX) 400 (241.3 Mg) MG tablet Take 1 tablet (400 mg total) by mouth daily. 60 tablet 1  . nystatin (MYCOSTATIN) 100000 UNIT/ML suspension Take 5 mLs (500,000 Units total) by mouth 4 (four) times daily. Swish and swallow 200 mL 1  . omeprazole (PRILOSEC) 40 MG capsule TAKE 1 CAPSULE BY MOUTH EVERY DAY (Patient taking differently: Take 40 mg by mouth 2 (two) times daily. ) 90 capsule 2  . ondansetron (ZOFRAN) 8 MG tablet Take 1 tablet (8 mg total) by mouth 2 (two) times daily as needed for refractory nausea / vomiting. Start on day 3 after chemo. 30 tablet 1  . Probiotic Product (PROBIOTIC PO) Take 1 capsule by mouth 2 (two) times daily.    . prochlorperazine (COMPAZINE) 10 MG tablet Take 1 tablet (10 mg total) by mouth every 6 (six) hours as needed (Nausea or vomiting). 30 tablet 1  . sucralfate (CARAFATE) 1 g tablet Take 1 tablet (1 g total) by mouth 4 (four) times daily. 120 tablet 2  . traZODone (DESYREL) 50 MG tablet TAKE 1 TABLET BY MOUTH EVERYDAY AT BEDTIME (Patient taking differently: Take 50 mg by mouth at bedtime. ) 90 tablet 2  . Vitamin D, Ergocalciferol, (DRISDOL) 1.25 MG (50000 UT) CAPS capsule Take 50,000 Units by mouth every 7 (seven) days.     [name] VITAMIN E PO Take 1 tablet by mouth daily.    Alveda Reasons 20 MG TABS tablet TAKE 1 TABLET DAILY WITH SUPPER. DC LOVENOX AND START XARELTO AT THE SCHEDULED TIME AS INSTRUCTED. (Patient taking differently: Take 20 mg by mouth daily with supper. ) 90 tablet 1   No current facility-administered medications for this visit.     REVIEW OF SYSTEMS:    A 10+ POINT  REVIEW OF SYSTEMS WAS OBTAINED including neurology, dermatology, psychiatry, cardiac, respiratory, lymph, extremities, GI, GU, Musculoskeletal, constitutional, breasts, reproductive, HEENT.   All pertinent positives are noted in the HPI.  All others are negative.   PHYSICAL EXAMINATION: ECOG PERFORMANCE STATUS: 2 - Symptomatic, <50% confined to bed  Vitals:   05/01/19 1026  BP: (!) 141/81  Pulse: (!) 107  Resp: 18  Temp: 98.7 F (37.1 C)  SpO2: 98%   Filed Weights   05/01/19 1026  Weight: 127 lb 9.6 oz (57.9 kg)  .  Wt Readings from Last 3 Encounters:  05/01/19 127 lb 9.6 oz (57.9 kg)  04/17/19 133 lb 12.8 oz (60.7 kg)  04/15/19 135 lb 6.4 oz (61.4 kg)   GENERAL:alert, in no acute distress and comfortable SKIN: no acute rashes, no significant lesions EYES: conjunctiva are pink and non-injected, sclera anicteric OROPHARYNX: MMM, no exudates, no oropharyngeal erythema or ulceration NECK: supple, no JVD LYMPH:  no palpable lymphadenopathy in the cervical, axillary or inguinal regions LUNGS: clear to auscultation b/l with normal respiratory effort HEART: regular rate & rhythm ABDOMEN:  normoactive bowel sounds , non tender, not distended. No palpable hepatosplenomegaly.  Extremity: no pedal edema PSYCH: alert & oriented x 3 with fluent speech NEURO: no focal motor/sensory deficits   LABORATORY DATA:  I have reviewed the data as listed  . CBC Latest Ref Rng & Units 05/01/2019 04/17/2019 04/10/2019  WBC 4.0 - 10.5 K/uL 5.7 4.8 3.9(L)  Hemoglobin 12.0 - 15.0 g/dL 10.3(L) 9.7(L) 8.7(L)  Hematocrit 36.0 - 46.0 % 33.4(L) 31.1(L) 27.9(L)  Platelets 150 - 400 K/uL 191 180 169   ANC 1.8k . CMP Latest Ref Rng & Units 05/01/2019 04/17/2019 04/10/2019  Glucose 70 - 99 mg/dL 103(H) 97 149(H)  BUN 8 - 23 mg/dL 14 13 15   Creatinine 0.44 - 1.00 mg/dL 1.00 0.98 0.97  Sodium 135 - 145 mmol/L 136 137 140  Potassium 3.5 - 5.1 mmol/L 4.5 3.3(L) 3.8  Chloride 98 - 111 mmol/L 106 105 112(H)  CO2 22 - 32 mmol/L 23 23 19(L)  Calcium 8.9 - 10.3 mg/dL 8.8(L) 7.9(L) 7.2(L)  Total Protein 6.5 - 8.1 g/dL 6.8 6.4(L) 6.1(L)  Total Bilirubin 0.3 - 1.2 mg/dL 0.4 0.5 0.5  Alkaline Phos 38 -  126 U/L 89 78 75  AST 15 - 41 U/L 22 23 21   ALT 0 - 44 U/L 9 10 8        01/02/19 Esophagus Biopsy:    RADIOGRAPHIC STUDIES:  .Dg Chest 2 View  Result Date: 04/16/2019 CLINICAL DATA:  Dyspnea on exertion EXAM: CHEST - 2 VIEW COMPARISON:  03/06/2019 FINDINGS: COPD with hyperinflation of the lungs and scarring in the bases. No superimposed acute infiltrate or effusion. Negative for heart failure. Heart size is normal. Atherosclerotic disease in the aortic arch. Port-A-Cath tip in the SVC in good position. IMPRESSION: COPD and scarring. No superimposed acute abnormality and no interval change from the prior study. Electronically Signed   By: Franchot Gallo M.D.   On: 04/16/2019 07:46    ASSESSMENT & PLAN:   78 y.o. female with  #1 Metastatic poorly differentiated carcinoma with likely lung primary non-small cell lung cancer.   CT of the head with and without contrast showed no evidence of metastatic disease. EGFR blood test mutation analysis negative. CT chest abdomen pelvis 04/19/2016 shows no evidence of disease progression. Patient tolerated Nivolumab very well but was discontinued when she developed grade 2 Immune colitis. Has been off Nivolumab for >6  months  CT chest abdomen pelvis on 06/24/2016 shows no evidence of new disease or progression of metastatic disease. CT chest abdomen pelvis 09/06/2016 shows 1. Mixed interval response to therapy. 2. There is a new left ventral chest wall lesion deep to the pectoralis musculature worrisome for metastatic disease. 3. Posterior lower lobe nodular densities are identified which may reflect areas of pulmonary metastasis. 4. Interval decrease in size of destructive lesion involving the left iliac bone.  CT chest abd pelvis 12/08/2016: Cystic mass involving the left ventral chest wall has resolved in the interval. Likely was a hematoma due to trauma. Interval increase in size of pleural base mass overlying the posterior and inferior left lower  lobe. There is also a new left pleural effusion identified.  CT chest 02/01/2017: Residual irregular soft tissue thickening/volume loss and trace left pleural fluid at the base of the left hemithorax, overall improved in appearance from 12/08/2016. No measurable lesion.  CT chest 05/29/2017 shows no residual pleural based mass or significant pleural effusion in the left hemithorax. No evidence of thoracic metastatic disease. No evidence of progressive metastatic disease within the abdomen or pelvis. Mixed lytic and blastic lesion involving the left iliac bone and associated pathologic fracture are unchanged.   CT CAP 09/14/17 shows no new changes. She does have slight displacement of her fractured left iliac bone. Evidence of stable disease.   CT CAP 01/04/2018- No new or progressive metastatic disease. Stable large left iliac bone metastasis with associated chronic pathologic fracture.   CT chest/abd/pelvis done on 04/26/18 revealed Stable exam.  No new or progressive interval findings.  07/19/18 CT C/A/P revealed Stable exam.  No new or progressive interval findings. Large destructive left iliac lesion is similar to prior. Aortic Atherosclerosis and Emphysema.    11/06/18 CT C/A/P revealed Similar appearance of large mixed lytic and sclerotic lesion in the left ilium. No new metastatic lesions are otherwise noted elsewhere in the chest, abdomen or pelvis. 2. Interval development of thickening of the distal third of the esophagus. This is nonspecific, and could be related to underlying reflux esophagitis. However, if there is any clinical concern for Barrett's metaplasia or esophageal neoplasia, further evaluation with nonemergent endoscopy could be considered. 3. Aortic atherosclerosis, in addition to left main coronary artery disease. Assessment for potential risk factor modification, dietary therapy or pharmacologic therapy may be warranted, if clinically Indicated. 4. Diffuse bronchial wall thickening  with mild to moderate centrilobular and paraseptal emphysema; imaging findings suggestive of underlying COPD. 5. Additional incidental findings, as above.   #2 Newly diagnosed Adenocarcinoma of the esophagus  Barrett's esophagus 4cms in the distal esophagus with low and high-grade dysplasia  01/02/19 Surgical pathology revealed adenocarcinoma of the esophagus   01/25/19 PET/CT revealed Distal esophageal primary, without hypermetabolic metastatic disease. 2. Chronic left iliac metastasis, as before. 3. Hypermetabolism within and superficial to the right gluteal musculature is most likely related to trauma and/or injection sites. 4. Aortic atherosclerosis, coronary artery atherosclerosis and emphysema.  S/p concurrent Carboplatin and Taxol weekly with RT of 45 Gy in 25 fractions and 5.4 Gy boost, completed between 02/04/19 and 03/27/19  #3 diarrhea-  now resolved was previously. 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 She was previously on on Lialda, budesonide,probiotics and lomotil but not currently taking any of these. Plan -Continue Sandostatin every 4 weeks   #4 h/o diverticulitis and c diff colitis -  now resolved Plan  -continue on sandostatin today and q4weeks.  -continue on Lialda  #5 DVT and PE  -continue on Xarelto - no issues with bleeding   #6 Dsypnea 03/14/19 ECHO revealed LV EF of 60-65% 03/06/19 CXR revealed clear lung fields, normal heart size 03/07/19 and 02/25/19 EKGs, no overt concern but some decreased QRS amplitude Did refer to pulmonology for further evaluation and lung function testing  Began steroid inhaler to mitigate possible inflammation in airway, could be some radiation related scarring and emphysema  PLAN: -Discussed pt labwork today, 05/01/19; blood counts are improving with HGB up to 10.3, normal WBC and PLT. Chemistries do not show overt  dehydration, however albumin is still low and magnesium is borderline low. -Will now increase 78m Marinol BID -Reiterated and stressed the importance and necessity of the pt doing her best to improve her nutritional status as she has been losing weight -Recommend speaking with home care to determine qualification for meals on wheels -Will refer pt to our nutritional therapist BErnestene Kielfor consultation as well -Continue PT at home  -Continue PO Magnesium and Potassium replacement -Proceed with IVF and will continue IVF at home 3 times per week -Low dose dexamethasone as well -Begin incentive spirometer -Will continue monthly Sandostatin -Hold Amlodipine as BP has been low and pt has been eating worse -Follow up with home health services -Continue with Xgeva every 4 weeks, no dental concerns at this time -Continue 2000 units Vitamin D every day, with dose adjustment to maintain 25OH Vit D levels of 40-60 -Trazodone for sleep difficulty -Will see the pt back in 2 weeks   -Dietician phone consult in 1-2 days -- significant post treatment weight loss -RTC with Dr KIrene Limbowith labs in 2 weeks   All questions were answered. The patient knows to call the clinic with any problems, questions or concerns.  The total time spent in the appt was 25 minutes and more than 50% was on counseling and direct patient cares.   GSullivan LoneMD [facility] Hematology/Oncology Physician [facility] (Office): [phone](Work cell): [phone](Fax): [phone] I, SBaldwin [place] am acting as a scribe for Dr. GSullivan Lone   .I have reviewed the above documentation for accuracy and completeness, and I agree with the above. .Brunetta GeneraMD

## 2019-04-30 NOTE — Progress Notes (Signed)
  Radiation Oncology         [phone] ________________________________  Name: FLORIENE JESCHKE MRN: [id number]  Date: 03/22/2019  DOB: 30-Aug-1941  End of Treatment Note  Diagnosis:   78 y.o. female with  Adenocarcinoma of the distal esophagus   Indication for treatment:  Curative       Radiation treatment dates:   02/04/2019 - 03/22/2019  Site/dose:   The esophagus received 50 Gy in 25 fractions initially. A boost was then given to 6 Gy to yield 56 Gy to the high dose target, utilizing a sequential boost technique.  Beams/energy:   IMRT / 6X Photon  Narrative: The patient tolerated radiation treatment relatively well.   She experienced increased fatigue and ongoing shortness of breath with activity, which resulted in a week-long break during her course of radiotherapy. She did have an echocardiogram as well as COVID-19 testing done to evaluate this. She also noted a moderate cough with clear phlegm and dry mouth. No major issues with swallowing or appetite.  Plan: The patient has completed radiation treatment. The patient will return to radiation oncology clinic for routine followup in one month. I advised them to call or return sooner if they have any questions or concerns related to their recovery or treatment.  ------------------------------------------------  Jodelle Gross, MD, PhD  This document serves as a record of services personally performed by Kyung Rudd, MD. It was created on his behalf by Rae Lips, a trained medical scribe. The creation of this record is based on the scribe's personal observations and the provider's statements to them. This document has been checked and approved by the attending provider.

## 2019-05-01 ENCOUNTER — Other Ambulatory Visit: Payer: Medicare Other

## 2019-05-01 ENCOUNTER — Inpatient Hospital Stay: Payer: Medicare Other

## 2019-05-01 ENCOUNTER — Other Ambulatory Visit: Payer: Self-pay

## 2019-05-01 ENCOUNTER — Inpatient Hospital Stay (HOSPITAL_BASED_OUTPATIENT_CLINIC_OR_DEPARTMENT_OTHER): Payer: Medicare Other | Admitting: Hematology

## 2019-05-01 ENCOUNTER — Ambulatory Visit: Payer: Medicare Other

## 2019-05-01 VITALS — BP 141/81 | HR 107 | Temp 98.7°F | Resp 18 | Ht 68.5 in | Wt 127.6 lb

## 2019-05-01 DIAGNOSIS — C774 Secondary and unspecified malignant neoplasm of inguinal and lower limb lymph nodes: Secondary | ICD-10-CM | POA: Diagnosis not present

## 2019-05-01 DIAGNOSIS — Z7901 Long term (current) use of anticoagulants: Secondary | ICD-10-CM | POA: Diagnosis not present

## 2019-05-01 DIAGNOSIS — Z95828 Presence of other vascular implants and grafts: Secondary | ICD-10-CM

## 2019-05-01 DIAGNOSIS — I2699 Other pulmonary embolism without acute cor pulmonale: Secondary | ICD-10-CM | POA: Diagnosis not present

## 2019-05-01 DIAGNOSIS — Z7189 Other specified counseling: Secondary | ICD-10-CM

## 2019-05-01 DIAGNOSIS — C3490 Malignant neoplasm of unspecified part of unspecified bronchus or lung: Secondary | ICD-10-CM | POA: Diagnosis not present

## 2019-05-01 DIAGNOSIS — Z87891 Personal history of nicotine dependence: Secondary | ICD-10-CM

## 2019-05-01 DIAGNOSIS — K219 Gastro-esophageal reflux disease without esophagitis: Secondary | ICD-10-CM | POA: Diagnosis not present

## 2019-05-01 DIAGNOSIS — C160 Malignant neoplasm of cardia: Secondary | ICD-10-CM | POA: Diagnosis not present

## 2019-05-01 DIAGNOSIS — C3491 Malignant neoplasm of unspecified part of right bronchus or lung: Secondary | ICD-10-CM

## 2019-05-01 DIAGNOSIS — I2510 Atherosclerotic heart disease of native coronary artery without angina pectoris: Secondary | ICD-10-CM | POA: Diagnosis not present

## 2019-05-01 DIAGNOSIS — C155 Malignant neoplasm of lower third of esophagus: Secondary | ICD-10-CM

## 2019-05-01 DIAGNOSIS — J438 Other emphysema: Secondary | ICD-10-CM | POA: Diagnosis not present

## 2019-05-01 DIAGNOSIS — C7951 Secondary malignant neoplasm of bone: Secondary | ICD-10-CM

## 2019-05-01 DIAGNOSIS — E785 Hyperlipidemia, unspecified: Secondary | ICD-10-CM | POA: Diagnosis not present

## 2019-05-01 DIAGNOSIS — K589 Irritable bowel syndrome without diarrhea: Secondary | ICD-10-CM | POA: Diagnosis not present

## 2019-05-01 DIAGNOSIS — R197 Diarrhea, unspecified: Secondary | ICD-10-CM

## 2019-05-01 DIAGNOSIS — K529 Noninfective gastroenteritis and colitis, unspecified: Secondary | ICD-10-CM

## 2019-05-01 DIAGNOSIS — J432 Centrilobular emphysema: Secondary | ICD-10-CM | POA: Diagnosis not present

## 2019-05-01 DIAGNOSIS — I10 Essential (primary) hypertension: Secondary | ICD-10-CM | POA: Diagnosis not present

## 2019-05-01 DIAGNOSIS — Z86711 Personal history of pulmonary embolism: Secondary | ICD-10-CM | POA: Diagnosis not present

## 2019-05-01 DIAGNOSIS — R634 Abnormal weight loss: Secondary | ICD-10-CM

## 2019-05-01 DIAGNOSIS — K521 Toxic gastroenteritis and colitis: Secondary | ICD-10-CM

## 2019-05-01 DIAGNOSIS — R918 Other nonspecific abnormal finding of lung field: Secondary | ICD-10-CM | POA: Diagnosis not present

## 2019-05-01 DIAGNOSIS — E559 Vitamin D deficiency, unspecified: Secondary | ICD-10-CM | POA: Diagnosis not present

## 2019-05-01 DIAGNOSIS — I700 Atherosclerosis of aorta: Secondary | ICD-10-CM | POA: Diagnosis not present

## 2019-05-01 DIAGNOSIS — K2270 Barrett's esophagus without dysplasia: Secondary | ICD-10-CM | POA: Diagnosis not present

## 2019-05-01 DIAGNOSIS — I82401 Acute embolism and thrombosis of unspecified deep veins of right lower extremity: Secondary | ICD-10-CM

## 2019-05-01 DIAGNOSIS — M84550D Pathological fracture in neoplastic disease, pelvis, subsequent encounter for fracture with routine healing: Secondary | ICD-10-CM | POA: Diagnosis not present

## 2019-05-01 DIAGNOSIS — G893 Neoplasm related pain (acute) (chronic): Secondary | ICD-10-CM | POA: Diagnosis not present

## 2019-05-01 LAB — CBC WITH DIFFERENTIAL/PLATELET
Abs Immature Granulocytes: 0.02 10*3/uL (ref 0.00–0.07)
Basophils Absolute: 0 10*3/uL (ref 0.0–0.1)
Basophils Relative: 0 %
Eosinophils Absolute: 0.1 10*3/uL (ref 0.0–0.5)
Eosinophils Relative: 2 %
HCT: 33.4 % — ABNORMAL LOW (ref 36.0–46.0)
Hemoglobin: 10.3 g/dL — ABNORMAL LOW (ref 12.0–15.0)
Immature Granulocytes: 0 %
Lymphocytes Relative: 18 %
Lymphs Abs: 1 10*3/uL (ref 0.7–4.0)
MCH: 28.2 pg (ref 26.0–34.0)
MCHC: 30.8 g/dL (ref 30.0–36.0)
MCV: 91.5 fL (ref 80.0–100.0)
Monocytes Absolute: 0.5 10*3/uL (ref 0.1–1.0)
Monocytes Relative: 9 %
Neutro Abs: 4 10*3/uL (ref 1.7–7.7)
Neutrophils Relative %: 71 %
Platelets: 191 10*3/uL (ref 150–400)
RBC: 3.65 MIL/uL — ABNORMAL LOW (ref 3.87–5.11)
RDW: 18.6 % — ABNORMAL HIGH (ref 11.5–15.5)
WBC: 5.7 10*3/uL (ref 4.0–10.5)
nRBC: 0 % (ref 0.0–0.2)

## 2019-05-01 LAB — CMP (CANCER CENTER ONLY)
ALT: 9 U/L (ref 0–44)
AST: 22 U/L (ref 15–41)
Albumin: 2.5 g/dL — ABNORMAL LOW (ref 3.5–5.0)
Alkaline Phosphatase: 89 U/L (ref 38–126)
Anion gap: 7 (ref 5–15)
BUN: 14 mg/dL (ref 8–23)
CO2: 23 mmol/L (ref 22–32)
Calcium: 8.8 mg/dL — ABNORMAL LOW (ref 8.9–10.3)
Chloride: 106 mmol/L (ref 98–111)
Creatinine: 1 mg/dL (ref 0.44–1.00)
GFR, Est AFR Am: >60 mL/min (ref >60)
GFR, Estimated: 54 mL/min — ABNORMAL LOW (ref >60)
Glucose, Bld: 103 mg/dL — ABNORMAL HIGH (ref 70–99)
Potassium: 4.5 mmol/L (ref 3.5–5.1)
Sodium: 136 mmol/L (ref 135–145)
Total Bilirubin: 0.4 mg/dL (ref 0.3–1.2)
Total Protein: 6.8 g/dL (ref 6.5–8.1)

## 2019-05-01 LAB — PHOSPHORUS: Phosphorus: 4.5 mg/dL (ref 2.5–4.6)

## 2019-05-01 LAB — MAGNESIUM: Magnesium: 1.6 mg/dL — ABNORMAL LOW (ref 1.7–2.4)

## 2019-05-01 MED ORDER — DENOSUMAB 120 MG/1.7ML ~~LOC~~ SOLN
120.0000 mg | Freq: Once | SUBCUTANEOUS | Status: AC
  Administered 2019-05-01: 120 mg via SUBCUTANEOUS

## 2019-05-01 MED ORDER — DRONABINOL 5 MG PO CAPS: 5.0000 mg | ORAL_CAPSULE | Freq: Two times a day (BID) | ORAL | 0 refills | Status: DC

## 2019-05-01 MED ORDER — OCTREOTIDE ACETATE 30 MG IM KIT
PACK | INTRAMUSCULAR | Status: AC
  Filled 2019-05-01: qty 1

## 2019-05-01 MED ORDER — OCTREOTIDE ACETATE 30 MG IM KIT
30.0000 mg | PACK | Freq: Once | INTRAMUSCULAR | Status: AC
  Administered 2019-05-01: 30 mg via INTRAMUSCULAR

## 2019-05-01 MED ORDER — SODIUM CHLORIDE 0.9% FLUSH
10.0000 mL | Freq: Once | INTRAVENOUS | Status: AC
  Administered 2019-05-01: 10 mL
  Filled 2019-05-01: qty 10

## 2019-05-01 MED ORDER — DENOSUMAB 120 MG/1.7ML ~~LOC~~ SOLN
SUBCUTANEOUS | Status: AC
  Filled 2019-05-01: qty 1.7

## 2019-05-01 NOTE — Progress Notes (Signed)
OK to give xgeva with calcium of 8.8 Per Dr. Irene Limbo.

## 2019-05-01 NOTE — Patient Instructions (Signed)
Octreotide injection solution What is this medicine? OCTREOTIDE (ok TREE oh tide) is used to reduce blood levels of growth hormone in patients with a condition called acromegaly. This medicine also reduces flushing and watery diarrhea caused by certain types of cancer. This medicine may be used for other purposes; ask your health care provider or pharmacist if you have questions. COMMON BRAND NAME(S): Sandostatin, Sandostatin LAR What should I tell my health care provider before I take this medicine? They need to know if you have any of these conditions: -gallbladder disease -kidney disease -liver disease -an unusual or allergic reaction to octreotide, other medicines, foods, dyes, or preservatives -pregnant or trying to get pregnant -breast-feeding How should I use this medicine? This medicine is for injection under the skin or into a vein (only in emergency situations). It is usually given by a health care professional in a hospital or clinic setting. If you get this medicine at home, you will be taught how to prepare and give this medicine. Allow the injection solution to come to room temperature before use. Do not warm it artificially. Use exactly as directed. Take your medicine at regular intervals. Do not take your medicine more often than directed. It is important that you put your used needles and syringes in a special sharps container. Do not put them in a trash can. If you do not have a sharps container, call your pharmacist or healthcare provider to get one. Talk to your pediatrician regarding the use of this medicine in children. Special care may be needed. Overdosage: If you think you have taken too much of this medicine contact a poison control center or emergency room at once. NOTE: This medicine is only for you. Do not share this medicine with others. What if I miss a dose? If you miss a dose, take it as soon as you can. If it is almost time for your next dose, take only that  dose. Do not take double or extra doses. What may interact with this medicine? Do not take this medicine with any of the following medications: -cisapride -droperidol -general anesthetics -grepafloxacin -perphenazine -thioridazine This medicine may also interact with the following medications: -bromocriptine -cyclosporine -diuretics -medicines for blood pressure, heart disease, irregular heart beat -medicines for diabetes, including insulin -quinidine This list may not describe all possible interactions. Give your health care provider a list of all the medicines, herbs, non-prescription drugs, or dietary supplements you use. Also tell them if you smoke, drink alcohol, or use illegal drugs. Some items may interact with your medicine. What should I watch for while using this medicine? Visit your doctor or health care professional for regular checks on your progress. To help reduce irritation at the injection site, use a different site for each injection and make sure the solution is at room temperature before use. This medicine may cause increases or decreases in blood sugar. Signs of high blood sugar include frequent urination, unusual thirst, flushed or dry skin, difficulty breathing, drowsiness, stomach ache, nausea, vomiting or dry mouth. Signs of low blood sugar include chills, cool, pale skin or cold sweats, drowsiness, extreme hunger, fast heartbeat, headache, nausea, nervousness or anxiety, shakiness, trembling, unsteadiness, tiredness, or weakness. Contact your doctor or health care professional right away if you experience any of these symptoms. This medicine may cause a decrease in vitamin B12. You should make sure that you get enough vitamin B12 while you are taking this medicine. Discuss the foods you eat and the vitamins you take with   your health care professional. What side effects may I notice from receiving this medicine? Side effects that you should report to your doctor or  health care professional as soon as possible: -allergic reactions like skin rash, itching or hives, swelling of the face, lips, or tongue -changes in blood sugar -changes in heart rate -severe stomach pain Side effects that usually do not require medical attention (report to your doctor or health care professional if they continue or are bothersome): -diarrhea or constipation -gas or stomach pain -nausea, vomiting -pain, redness, swelling and irritation at site where injected This list may not describe all possible side effects. Call your doctor for medical advice about side effects. You may report side effects to FDA at 1-800-FDA-1088. Where should I keep my medicine? Keep out of the reach of children. Store in a refrigerator between 2 and 8 degrees C (36 and 46 degrees F). Protect from light. Allow to come to room temperature naturally. Do not use artificial heat. If protected from light, the injection may be stored at room temperature between 20 and 30 degrees C (70 and 86 degrees F) for 14 days. After the initial use, throw away any unused portion of a multiple dose vial after 14 days. Throw away unused portions of the ampules after use. NOTE: This sheet is a summary. It may not cover all possible information. If you have questions about this medicine, talk to your doctor, pharmacist, or health care provider.  2019 Elsevier/Gold Standard (2017-07-07 13:24:44) Denosumab injection What is this medicine? DENOSUMAB (den oh sue mab) slows bone breakdown. Prolia is used to treat osteoporosis in women after menopause and in men, and in people who are taking corticosteroids for 6 months or more. Delton See is used to treat a high calcium level due to cancer and to prevent bone fractures and other bone problems caused by multiple myeloma or cancer bone metastases. Delton See is also used to treat giant cell tumor of the bone. This medicine may be used for other purposes; ask your health care provider or  pharmacist if you have questions. COMMON BRAND NAME(S): Prolia, XGEVA What should I tell my health care provider before I take this medicine? They need to know if you have any of these conditions: -dental disease -having surgery or tooth extraction -infection -kidney disease -low levels of calcium or Vitamin D in the blood -malnutrition -on hemodialysis -skin conditions or sensitivity -thyroid or parathyroid disease -an unusual reaction to denosumab, other medicines, foods, dyes, or preservatives -pregnant or trying to get pregnant -breast-feeding How should I use this medicine? This medicine is for injection under the skin. It is given by a health care professional in a hospital or clinic setting. A special MedGuide will be given to you before each treatment. Be sure to read this information carefully each time. For Prolia, talk to your pediatrician regarding the use of this medicine in children. Special care may be needed. For Delton See, talk to your pediatrician regarding the use of this medicine in children. While this drug may be prescribed for children as young as 13 years for selected conditions, precautions do apply. Overdosage: If you think you have taken too much of this medicine contact a poison control center or emergency room at once. NOTE: This medicine is only for you. Do not share this medicine with others. What if I miss a dose? It is important not to miss your dose. Call your doctor or health care professional if you are unable to keep an appointment. What  may interact with this medicine? Do not take this medicine with any of the following medications: -other medicines containing denosumab This medicine may also interact with the following medications: -medicines that lower your chance of fighting infection -steroid medicines like prednisone or cortisone This list may not describe all possible interactions. Give your health care provider a list of all the medicines, herbs,  non-prescription drugs, or dietary supplements you use. Also tell them if you smoke, drink alcohol, or use illegal drugs. Some items may interact with your medicine. What should I watch for while using this medicine? Visit your doctor or health care professional for regular checks on your progress. Your doctor or health care professional may order blood tests and other tests to see how you are doing. Call your doctor or health care professional for advice if you get a fever, chills or sore throat, or other symptoms of a cold or flu. Do not treat yourself. This drug may decrease your body's ability to fight infection. Try to avoid being around people who are sick. You should make sure you get enough calcium and vitamin D while you are taking this medicine, unless your doctor tells you not to. Discuss the foods you eat and the vitamins you take with your health care professional. See your dentist regularly. Brush and floss your teeth as directed. Before you have any dental work done, tell your dentist you are receiving this medicine. Do not become pregnant while taking this medicine or for 5 months after stopping it. Talk with your doctor or health care professional about your birth control options while taking this medicine. Women should inform their doctor if they wish to become pregnant or think they might be pregnant. There is a potential for serious side effects to an unborn child. Talk to your health care professional or pharmacist for more information. What side effects may I notice from receiving this medicine? Side effects that you should report to your doctor or health care professional as soon as possible: -allergic reactions like skin rash, itching or hives, swelling of the face, lips, or tongue -bone pain -breathing problems -dizziness -jaw pain, especially after dental work -redness, blistering, peeling of the skin -signs and symptoms of infection like fever or chills; cough; sore throat;  pain or trouble passing urine -signs of low calcium like fast heartbeat, muscle cramps or muscle pain; pain, tingling, numbness in the hands or feet; seizures -unusual bleeding or bruising -unusually weak or tired Side effects that usually do not require medical attention (report to your doctor or health care professional if they continue or are bothersome): -constipation -diarrhea -headache -joint pain -loss of appetite -muscle pain -runny nose -tiredness -upset stomach This list may not describe all possible side effects. Call your doctor for medical advice about side effects. You may report side effects to FDA at 1-800-FDA-1088. Where should I keep my medicine? This medicine is only given in a clinic, doctor's office, or other health care setting and will not be stored at home. NOTE: This sheet is a summary. It may not cover all possible information. If you have questions about this medicine, talk to your doctor, pharmacist, or health care provider.  2019 Elsevier/Gold Standard (2018-03-30 16:10:44)

## 2019-05-02 ENCOUNTER — Telehealth: Payer: Self-pay | Admitting: Hematology

## 2019-05-02 ENCOUNTER — Telehealth: Payer: Self-pay

## 2019-05-02 NOTE — Telephone Encounter (Signed)
Scheduled appt per 5/27 los.  Spoke with patient and patient aware of their appt date and time.  Sent a message for the nutrition appt.

## 2019-05-02 NOTE — Telephone Encounter (Signed)
Nutrition Follow-up:  RD working remotely.  RD previously reached out to patient via phone on 5/5 (see note) and patient refused information from RD.    Request received from Dr. Irene Limbo for nutrition follow-up due to weight loss.   78 year old female with metastatic non-small cell lung cancer and adenocarcinoma of esophagus.  Noted patient getting fluids 3 times per week in her home.    Spoke with patient via phone this pm at the request of Dr. Irene Limbo.  Patient states, "I don't think you can help me can you."  "I just have to eat more."  RD responded that some tips could be shared if patient was willing to accept information.  Patient agreed for RD to continue assessment and provide recommendations.    Patient reports no appetite and has metallic taste in mouth.  Reports that she eat a piece of toast and drinks coffee in the morning but nothing else before noon each day.  Unable to tell RD what she eats during the rest of the day.  Reports that she has drank oral nutrition supplements awhile back and got tired of them.  Reports that she needs to start drinking them again.    Medications: marinol added  Labs: glucose 103  Anthropometrics:   Height: 68.5 inches Weight: 127 lb 9.6 oz (5/27) Noted weight on 3/23 144 lb BMI: 19  12% weight loss in the last 2 months, significant   Estimated Energy Needs  Kcals: 1700-2000 Protein: 85-100 g Fluid: 2 L  NUTRITION DIAGNOSIS: Inadequate oral intake related to cancer and cancer related side effects as evidenced by 12% weight loss in 2 months and poor appetite    INTERVENTION:  Discussed ways to increase calories and protein.  Will mail handout. Encouraged patient to eat small frequent meals/snacks q 2 hours.  Encouraged high calories shakes (350 or more). Discussed ways to change flavor (ie make shakes, smoothies) Discussed ways to improve metallic taste.  Will make handout as well.   Contact information provided to patient.  Patient  refused follow-up with RD at this time stating she will call RD with questions    MONITORING, EVALUATION, GOAL: Patient will consume adequate calories and protein to prevent weight loss   NEXT VISIT: patient refused follow-up  Jocabed Cheese B. Zenia Resides, [place], [place] Registered Dietitian [phone] (pager)

## 2019-05-03 DIAGNOSIS — E785 Hyperlipidemia, unspecified: Secondary | ICD-10-CM | POA: Diagnosis not present

## 2019-05-03 DIAGNOSIS — C7951 Secondary malignant neoplasm of bone: Secondary | ICD-10-CM | POA: Diagnosis not present

## 2019-05-03 DIAGNOSIS — I2510 Atherosclerotic heart disease of native coronary artery without angina pectoris: Secondary | ICD-10-CM | POA: Diagnosis not present

## 2019-05-03 DIAGNOSIS — R918 Other nonspecific abnormal finding of lung field: Secondary | ICD-10-CM | POA: Diagnosis not present

## 2019-05-03 DIAGNOSIS — Z86711 Personal history of pulmonary embolism: Secondary | ICD-10-CM | POA: Diagnosis not present

## 2019-05-03 DIAGNOSIS — K2270 Barrett's esophagus without dysplasia: Secondary | ICD-10-CM | POA: Diagnosis not present

## 2019-05-03 DIAGNOSIS — Z7901 Long term (current) use of anticoagulants: Secondary | ICD-10-CM | POA: Diagnosis not present

## 2019-05-03 DIAGNOSIS — I700 Atherosclerosis of aorta: Secondary | ICD-10-CM | POA: Diagnosis not present

## 2019-05-03 DIAGNOSIS — J432 Centrilobular emphysema: Secondary | ICD-10-CM | POA: Diagnosis not present

## 2019-05-03 DIAGNOSIS — G893 Neoplasm related pain (acute) (chronic): Secondary | ICD-10-CM | POA: Diagnosis not present

## 2019-05-03 DIAGNOSIS — J438 Other emphysema: Secondary | ICD-10-CM | POA: Diagnosis not present

## 2019-05-03 DIAGNOSIS — M84550D Pathological fracture in neoplastic disease, pelvis, subsequent encounter for fracture with routine healing: Secondary | ICD-10-CM | POA: Diagnosis not present

## 2019-05-03 DIAGNOSIS — C3490 Malignant neoplasm of unspecified part of unspecified bronchus or lung: Secondary | ICD-10-CM | POA: Diagnosis not present

## 2019-05-03 DIAGNOSIS — C160 Malignant neoplasm of cardia: Secondary | ICD-10-CM | POA: Diagnosis not present

## 2019-05-03 DIAGNOSIS — E559 Vitamin D deficiency, unspecified: Secondary | ICD-10-CM | POA: Diagnosis not present

## 2019-05-03 DIAGNOSIS — I10 Essential (primary) hypertension: Secondary | ICD-10-CM | POA: Diagnosis not present

## 2019-05-03 DIAGNOSIS — K219 Gastro-esophageal reflux disease without esophagitis: Secondary | ICD-10-CM | POA: Diagnosis not present

## 2019-05-03 DIAGNOSIS — K589 Irritable bowel syndrome without diarrhea: Secondary | ICD-10-CM | POA: Diagnosis not present

## 2019-05-06 ENCOUNTER — Other Ambulatory Visit: Payer: Self-pay | Admitting: Hematology

## 2019-05-06 DIAGNOSIS — C3490 Malignant neoplasm of unspecified part of unspecified bronchus or lung: Secondary | ICD-10-CM | POA: Diagnosis not present

## 2019-05-06 DIAGNOSIS — E785 Hyperlipidemia, unspecified: Secondary | ICD-10-CM | POA: Diagnosis not present

## 2019-05-06 DIAGNOSIS — C160 Malignant neoplasm of cardia: Secondary | ICD-10-CM | POA: Diagnosis not present

## 2019-05-06 DIAGNOSIS — Z86711 Personal history of pulmonary embolism: Secondary | ICD-10-CM | POA: Diagnosis not present

## 2019-05-06 DIAGNOSIS — J432 Centrilobular emphysema: Secondary | ICD-10-CM | POA: Diagnosis not present

## 2019-05-06 DIAGNOSIS — G893 Neoplasm related pain (acute) (chronic): Secondary | ICD-10-CM | POA: Diagnosis not present

## 2019-05-06 DIAGNOSIS — C7951 Secondary malignant neoplasm of bone: Secondary | ICD-10-CM | POA: Diagnosis not present

## 2019-05-06 DIAGNOSIS — I700 Atherosclerosis of aorta: Secondary | ICD-10-CM | POA: Diagnosis not present

## 2019-05-06 DIAGNOSIS — K219 Gastro-esophageal reflux disease without esophagitis: Secondary | ICD-10-CM | POA: Diagnosis not present

## 2019-05-06 DIAGNOSIS — I10 Essential (primary) hypertension: Secondary | ICD-10-CM | POA: Diagnosis not present

## 2019-05-06 DIAGNOSIS — M84550D Pathological fracture in neoplastic disease, pelvis, subsequent encounter for fracture with routine healing: Secondary | ICD-10-CM | POA: Diagnosis not present

## 2019-05-06 DIAGNOSIS — K589 Irritable bowel syndrome without diarrhea: Secondary | ICD-10-CM | POA: Diagnosis not present

## 2019-05-06 DIAGNOSIS — R918 Other nonspecific abnormal finding of lung field: Secondary | ICD-10-CM | POA: Diagnosis not present

## 2019-05-06 DIAGNOSIS — Z7901 Long term (current) use of anticoagulants: Secondary | ICD-10-CM | POA: Diagnosis not present

## 2019-05-06 DIAGNOSIS — K2270 Barrett's esophagus without dysplasia: Secondary | ICD-10-CM | POA: Diagnosis not present

## 2019-05-06 DIAGNOSIS — E559 Vitamin D deficiency, unspecified: Secondary | ICD-10-CM | POA: Diagnosis not present

## 2019-05-06 DIAGNOSIS — I2510 Atherosclerotic heart disease of native coronary artery without angina pectoris: Secondary | ICD-10-CM | POA: Diagnosis not present

## 2019-05-06 DIAGNOSIS — J438 Other emphysema: Secondary | ICD-10-CM | POA: Diagnosis not present

## 2019-05-07 ENCOUNTER — Telehealth: Payer: Self-pay | Admitting: *Deleted

## 2019-05-07 DIAGNOSIS — E860 Dehydration: Secondary | ICD-10-CM | POA: Diagnosis not present

## 2019-05-07 DIAGNOSIS — C3480 Malignant neoplasm of overlapping sites of unspecified bronchus and lung: Secondary | ICD-10-CM | POA: Diagnosis not present

## 2019-05-07 NOTE — Telephone Encounter (Signed)
Contacted by Morey Hummingbird, nurse with [facility]. Voice Mail: She completed patient's last ordered day of home fluids on Monday and will de-access her port on Friday. Does Dr.Kale want home infusions to continue.  If continuation of twice weekly fluids is needed, please contact @ [phone].  Attempted to contact Morey Hummingbird, nurse, at number provided. Informed her that Dr.Kale wants to continue fluids for 2 more weeks and reassess patient at next appt on 6/10. Asked her to fax orders to desk fax.  Left Dr. Irene Limbo desk number for contact and desk fax [phone].

## 2019-05-07 NOTE — Telephone Encounter (Signed)
Carrie contacted office - LVM advising for Dr. Irene Limbo to contact [facility] (fluids)  and [facility] (Nursing care) to continue orders of 1 l NS 3x week (M-W-F) for 2 more weeks.  Contacted Carolynn Sayers with [facility], gave verbal orders for nursing care and fluids. Pam verbalized understanding of verbal orders for patient.

## 2019-05-08 ENCOUNTER — Other Ambulatory Visit: Payer: Self-pay | Admitting: Hematology

## 2019-05-08 DIAGNOSIS — C155 Malignant neoplasm of lower third of esophagus: Secondary | ICD-10-CM

## 2019-05-08 DIAGNOSIS — C3490 Malignant neoplasm of unspecified part of unspecified bronchus or lung: Secondary | ICD-10-CM

## 2019-05-08 DIAGNOSIS — E876 Hypokalemia: Secondary | ICD-10-CM

## 2019-05-09 ENCOUNTER — Telehealth: Payer: Self-pay | Admitting: *Deleted

## 2019-05-09 NOTE — Telephone Encounter (Signed)
Faxed signed orders for IV fluid - 1L NS 3x week for 2 weeks - to [facility] (works with [facility]) Fax confirmation received.

## 2019-05-10 ENCOUNTER — Other Ambulatory Visit: Payer: Self-pay | Admitting: Adult Health

## 2019-05-10 DIAGNOSIS — I10 Essential (primary) hypertension: Secondary | ICD-10-CM | POA: Diagnosis not present

## 2019-05-10 DIAGNOSIS — K589 Irritable bowel syndrome without diarrhea: Secondary | ICD-10-CM | POA: Diagnosis not present

## 2019-05-10 DIAGNOSIS — R918 Other nonspecific abnormal finding of lung field: Secondary | ICD-10-CM | POA: Diagnosis not present

## 2019-05-10 DIAGNOSIS — I700 Atherosclerosis of aorta: Secondary | ICD-10-CM | POA: Diagnosis not present

## 2019-05-10 DIAGNOSIS — E785 Hyperlipidemia, unspecified: Secondary | ICD-10-CM | POA: Diagnosis not present

## 2019-05-10 DIAGNOSIS — J432 Centrilobular emphysema: Secondary | ICD-10-CM | POA: Diagnosis not present

## 2019-05-10 DIAGNOSIS — M84550D Pathological fracture in neoplastic disease, pelvis, subsequent encounter for fracture with routine healing: Secondary | ICD-10-CM | POA: Diagnosis not present

## 2019-05-10 DIAGNOSIS — C160 Malignant neoplasm of cardia: Secondary | ICD-10-CM | POA: Diagnosis not present

## 2019-05-10 DIAGNOSIS — J438 Other emphysema: Secondary | ICD-10-CM | POA: Diagnosis not present

## 2019-05-10 DIAGNOSIS — Z7901 Long term (current) use of anticoagulants: Secondary | ICD-10-CM | POA: Diagnosis not present

## 2019-05-10 DIAGNOSIS — K219 Gastro-esophageal reflux disease without esophagitis: Secondary | ICD-10-CM | POA: Diagnosis not present

## 2019-05-10 DIAGNOSIS — I2510 Atherosclerotic heart disease of native coronary artery without angina pectoris: Secondary | ICD-10-CM | POA: Diagnosis not present

## 2019-05-10 DIAGNOSIS — C3490 Malignant neoplasm of unspecified part of unspecified bronchus or lung: Secondary | ICD-10-CM | POA: Diagnosis not present

## 2019-05-10 DIAGNOSIS — G893 Neoplasm related pain (acute) (chronic): Secondary | ICD-10-CM | POA: Diagnosis not present

## 2019-05-10 DIAGNOSIS — C7951 Secondary malignant neoplasm of bone: Secondary | ICD-10-CM | POA: Diagnosis not present

## 2019-05-10 DIAGNOSIS — Z86711 Personal history of pulmonary embolism: Secondary | ICD-10-CM | POA: Diagnosis not present

## 2019-05-10 DIAGNOSIS — E559 Vitamin D deficiency, unspecified: Secondary | ICD-10-CM | POA: Diagnosis not present

## 2019-05-10 DIAGNOSIS — K2270 Barrett's esophagus without dysplasia: Secondary | ICD-10-CM | POA: Diagnosis not present

## 2019-05-12 ENCOUNTER — Other Ambulatory Visit: Payer: Self-pay | Admitting: Radiation Oncology

## 2019-05-13 DIAGNOSIS — E785 Hyperlipidemia, unspecified: Secondary | ICD-10-CM | POA: Diagnosis not present

## 2019-05-13 DIAGNOSIS — I2510 Atherosclerotic heart disease of native coronary artery without angina pectoris: Secondary | ICD-10-CM | POA: Diagnosis not present

## 2019-05-13 DIAGNOSIS — G893 Neoplasm related pain (acute) (chronic): Secondary | ICD-10-CM | POA: Diagnosis not present

## 2019-05-13 DIAGNOSIS — K2270 Barrett's esophagus without dysplasia: Secondary | ICD-10-CM | POA: Diagnosis not present

## 2019-05-13 DIAGNOSIS — R918 Other nonspecific abnormal finding of lung field: Secondary | ICD-10-CM | POA: Diagnosis not present

## 2019-05-13 DIAGNOSIS — Z7901 Long term (current) use of anticoagulants: Secondary | ICD-10-CM | POA: Diagnosis not present

## 2019-05-13 DIAGNOSIS — I700 Atherosclerosis of aorta: Secondary | ICD-10-CM | POA: Diagnosis not present

## 2019-05-13 DIAGNOSIS — C160 Malignant neoplasm of cardia: Secondary | ICD-10-CM | POA: Diagnosis not present

## 2019-05-13 DIAGNOSIS — E559 Vitamin D deficiency, unspecified: Secondary | ICD-10-CM | POA: Diagnosis not present

## 2019-05-13 DIAGNOSIS — Z86711 Personal history of pulmonary embolism: Secondary | ICD-10-CM | POA: Diagnosis not present

## 2019-05-13 DIAGNOSIS — M84550D Pathological fracture in neoplastic disease, pelvis, subsequent encounter for fracture with routine healing: Secondary | ICD-10-CM | POA: Diagnosis not present

## 2019-05-13 DIAGNOSIS — K219 Gastro-esophageal reflux disease without esophagitis: Secondary | ICD-10-CM | POA: Diagnosis not present

## 2019-05-13 DIAGNOSIS — J438 Other emphysema: Secondary | ICD-10-CM | POA: Diagnosis not present

## 2019-05-13 DIAGNOSIS — C7951 Secondary malignant neoplasm of bone: Secondary | ICD-10-CM | POA: Diagnosis not present

## 2019-05-13 DIAGNOSIS — C3490 Malignant neoplasm of unspecified part of unspecified bronchus or lung: Secondary | ICD-10-CM | POA: Diagnosis not present

## 2019-05-13 DIAGNOSIS — I10 Essential (primary) hypertension: Secondary | ICD-10-CM | POA: Diagnosis not present

## 2019-05-13 DIAGNOSIS — K589 Irritable bowel syndrome without diarrhea: Secondary | ICD-10-CM | POA: Diagnosis not present

## 2019-05-13 DIAGNOSIS — J432 Centrilobular emphysema: Secondary | ICD-10-CM | POA: Diagnosis not present

## 2019-05-14 NOTE — Progress Notes (Signed)
HEMATOLOGY ONCOLOGY PROGRESS NOTE  Date of service:  05/15/19    Patient Care Team: [name], NP as PCP - General (Family Medicine) [name], MD as Consulting Physician (Hematology and Oncology)  CHIEF COMPLAINTS/PURPOSE OF CONSULTATION:  F/u for metastatic lung cancer  DIAGNOSIS:   #1 Metastatic non-small cell lung cancer with bilateral lung nodules and large metastatic lesion in the left Ilium. 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 Also had c diff colitis - treated   Current Treatment  1) Active surveillance 2) Xgeva 13m [place] q4weeks for bone metastases. 3) Sandostatin q4weeks for diarrhea - immune colitis  Previous Treatment  1 Palliative radiation therapy to the large left ilium metastases 2. IV Nivolumab x 20ycles (discontinued due to likely immune colitis) 3. Xgeva 1281mSC q4weeks for bone metastases.   HISTORY OF PRESENTING ILLNESS: (plz see my previous consultation for details of initial presentation)  INTERVAL HISTORY:   Ms ToDossantoss here for her scheduled follow-up for metastatic lung cancer, and adenocarcinoma of the esophagus. The patient's last visit with [place] on 05/01/19. The pt reports that she is doing well overall.  The pt reports that she has found her increased dose of Marinol to be very helpful. She notes that she has "actually felt hungry," notes she is eating much better, and is "feeling much better." She notes that she has also been staying better hydrated. She notes that she has mild dizzy spells when she first wakes up which has been improving. She notes that she has been using a walker in her home which has been helpful for her. She has been increasing her activity levels mildly. She denies problems swallowing and is using sucralfate prior to meals. Moving her bowels well.  Lab results today (05/15/19) of CBC w/diff and CMP is as follows: all values are WNL except  for HGB at 11.1, RDW at 16.3, Glucose at 113, Creatinine at 1.09, Calcium at 8.6, Albumin at 2.6, GFR at 48. 05/15/19 Magnesium at 1.4  On review of systems, pt reports eating better, improved appetite, staying hydrated, mildly increased activity levels, moving her bowels well, and denies problems swallowing, leg swelling, abdominal pains, and any other symptoms.   MEDICAL HISTORY:  Past Medical History:  Diagnosis Date  . Barrett's esophagus   . Bone neoplasm 06/24/2015  [facility]   metastatic poorly differentiated carcinoma. tumor left groin surgical removal with radiation tx.  . Cataract    BILATERAL  . Cigarette smoker two packs a day or less    Currently still smoking 2 PPD - Not interested in quitting at this time.  . Colitis 2017  . Colon polyps    hyperplastic, tubular adenomas, tubulovillous adenoma  . Cough, persistent    hx. lung cancer ? primary-being evaluated, unsure of primary site.  . Depression 06/24/2015  . Diverticulosis   . [place]  . Endometriosis    Hysterectomy with BSO at age [age]rs  . [place]   intramucosal  . Gastritis   . GERD (gastroesophageal reflux disease)   . H/O: pneumonia   . Hiatal hernia   . Hyperlipidemia   . Hypertension 06/24/2015   likely improved incidental to 40 lbs weight loss from her neoplasm. No Longer taking med for this as of 08-06-15  . IBS (irritable bowel syndrome)   . Pain    left hip-persistent"tumor of bone"-radiation  tx. 10.  . Vitamin D deficiency disease    SURGICAL HISTORY: Past Surgical History:  Procedure Laterality Date  . BARTHOLIN GLAND CYST EXCISION  78 yo ago   Does not want if it was an infected cyst or tumor. Was soon as delivery  . BIOPSY  01/02/2019   Procedure: BIOPSY;  Surgeon: [name], MD;  Location: [facility];  Service: Gastroenterology;;  . CATARACT EXTRACTION    . COLONOSCOPY W/ POLYPECTOMY     multiple times - last done 09/2014 per  patient.  . ESOPHAGOGASTRODUODENOSCOPY (EGD) WITH PROPOFOL N/A 08/11/2015   Procedure: ESOPHAGOGASTRODUODENOSCOPY (EGD) WITH PROPOFOL;  Surgeon: [name], MD;  Location: [facility];  Service: Gastroenterology;  Laterality: N/A;  . ESOPHAGOGASTRODUODENOSCOPY (EGD) WITH PROPOFOL N/A 01/02/2019   Procedure: ESOPHAGOGASTRODUODENOSCOPY (EGD) WITH PROPOFOL;  Surgeon: [name], MD;  Location: [facility];  Service: Gastroenterology;  Laterality: N/A;  . FLEXIBLE SIGMOIDOSCOPY N/A 06/24/2017   Procedure: FLEXIBLE SIGMOIDOSCOPY;  Surgeon: Manus Gunning, MD;  Location: [facility];  Service: Gastroenterology;  Laterality: N/A;  . GANGLION CYST EXCISION    . KNEE ARTHROSCOPY  age about 70 yrs  . TONSILLECTOMY    . TOTAL ABDOMINAL HYSTERECTOMY W/ BILATERAL SALPINGOOPHORECTOMY  at age 11 yrs   For endometriosis    SOCIAL HISTORY: Social History   Socioeconomic History  . Marital status: Widowed    Spouse name: Not on file  . Number of children: 2  . Years of education: Not on file  . Highest education level: Not on file  Occupational History  . Not on file  Social Needs  . Financial resource strain: Not on file  . Food insecurity:    Worry: Not on file    Inability: Not on file  . Transportation needs:    Medical: No    Non-medical: No  Tobacco Use  . Smoking status: Former Smoker    Packs/day: 1.00    Years: 60.00    Pack years: 60.00    Types: Cigarettes    Last attempt to quit: 12/05/2014    Years since quitting: 4.4  . Smokeless tobacco: Never Used  Substance and Sexual Activity  . Alcohol use: No    Alcohol/week: 0.0 standard drinks  . Drug use: No  . Sexual activity: Never  Lifestyle  . Physical activity:    Days per week: Not on file    Minutes per session: Not on file  . Stress: Not on file  Relationships  . Social connections:    Talks on phone: Not on file    Gets together: Not on file    Attends religious service: Not on file    Active member of  club or organization: Not on file    Attends meetings of clubs or organizations: Not on file    Relationship status: Not on file  . Intimate partner violence:    Fear of current or ex partner: Not on file    Emotionally abused: Not on file    Physically abused: Not on file    Forced sexual activity: Not on file  Other Topics Concern  . Not on file  Social History Narrative  . Not on file    FAMILY HISTORY: Family History  Problem Relation Age of Onset  . Colon cancer Brother   . Colon cancer Brother   . Stroke Mother   . Colon cancer Father   . Emphysema Father        smoked  .  Breast cancer Daughter 76       ER/PR+ stage II    ALLERGIES:  is allergic to penicillins; remeron [mirtazapine]; and latex. patient wonders if she has a penicillin allergy but notes that she is uncertain about this.  MEDICATIONS:  Current Outpatient Medications  Medication Sig Dispense Refill  . amitriptyline (ELAVIL) 25 MG tablet Take 25 mg by mouth at bedtime.     . Cholecalciferol (VITAMIN D3) 25 MCG (1000 UT) CAPS Take 1,000 Units by mouth daily.    [name] dexamethasone (DECADRON) 2 MG tablet Take 1 tablet (2 mg total) by mouth daily with breakfast. 30 tablet 0  . dronabinol (MARINOL) 5 MG capsule Take 1 capsule (5 mg total) by mouth 2 (two) times daily before a meal. 60 capsule 0  . FLUoxetine (PROZAC) 20 MG capsule Take 1 capsule (20 mg total) by mouth daily. 90 capsule 3  . KLOR-CON M20 20 MEQ tablet TAKE 1 TABLET BY MOUTH TWICE A DAY 60 tablet 1  . LORazepam (ATIVAN) 0.5 MG tablet Take 1 tablet (0.5 mg total) by mouth every 6 (six) hours as needed (Nausea or vomiting). 30 tablet 0  . magnesium oxide (MAG-OX) 400 (241.3 Mg) MG tablet Take 1 tablet (400 mg total) by mouth daily. 60 tablet 1  . nystatin (MYCOSTATIN) 100000 UNIT/ML suspension Take 5 mLs (500,000 Units total) by mouth 4 (four) times daily. Swish and swallow 200 mL 1  . omeprazole (PRILOSEC) 40 MG capsule TAKE 1 CAPSULE BY MOUTH EVERY  DAY 90 capsule 0  . ondansetron (ZOFRAN) 8 MG tablet Take 1 tablet (8 mg total) by mouth 2 (two) times daily as needed for refractory nausea / vomiting. Start on day 3 after chemo. 30 tablet 1  . Probiotic Product (PROBIOTIC PO) Take 1 capsule by mouth 2 (two) times daily.    . prochlorperazine (COMPAZINE) 10 MG tablet Take 1 tablet (10 mg total) by mouth every 6 (six) hours as needed (Nausea or vomiting). 30 tablet 1  . rivaroxaban (XARELTO) 20 MG TABS tablet Take 1 tablet (20 mg total) by mouth daily with supper. 30 tablet 2  . sucralfate (CARAFATE) 1 g tablet Take 1 tablet (1 g total) by mouth 4 (four) times daily. 120 tablet 2  . traZODone (DESYREL) 50 MG tablet TAKE 1 TABLET BY MOUTH EVERYDAY AT BEDTIME (Patient taking differently: Take 50 mg by mouth at bedtime. ) 90 tablet 2  . Vitamin D, Ergocalciferol, (DRISDOL) 1.25 MG (50000 UT) CAPS capsule Take 50,000 Units by mouth every 7 (seven) days.     [name] VITAMIN E PO Take 1 tablet by mouth daily.    [name] albuterol (PROVENTIL HFA;VENTOLIN HFA) 108 (90 Base) MCG/ACT inhaler Inhale 1 puff into the lungs every 6 (six) hours as needed for wheezing or shortness of breath. 1 Inhaler 1  . lidocaine-prilocaine (EMLA) cream Apply to affected area once (Patient taking differently: Apply 1 application topically as needed (port access). Apply to affected area once) 30 g 3   No current facility-administered medications for this visit.     REVIEW OF SYSTEMS:    A 10+ POINT REVIEW OF SYSTEMS WAS OBTAINED including neurology, dermatology, psychiatry, cardiac, respiratory, lymph, extremities, GI, GU, Musculoskeletal, constitutional, breasts, reproductive, HEENT.  All pertinent positives are noted in the HPI.  All others are negative.   PHYSICAL EXAMINATION: ECOG PERFORMANCE STATUS: 2 - Symptomatic, <50% confined to bed  Vitals:   05/15/19 1121  BP: (!) 146/72  Pulse: 100  Resp: 17  Temp: 98.5 F (36.9 C)  SpO2: 100%   Filed Weights   05/15/19 1121   Weight: 129 lb 12.8 oz (58.9 kg)  .  Wt Readings from Last 3 Encounters:  05/15/19 129 lb 12.8 oz (58.9 kg)  05/01/19 127 lb 9.6 oz (57.9 kg)  04/17/19 133 lb 12.8 oz (60.7 kg)    GENERAL:alert, in no acute distress and comfortable SKIN: no acute rashes, no significant lesions EYES: conjunctiva are pink and non-injected, sclera anicteric OROPHARYNX: MMM, no exudates, no oropharyngeal erythema or ulceration NECK: supple, no JVD LYMPH:  no palpable lymphadenopathy in the cervical, axillary or inguinal regions LUNGS: clear to auscultation b/l with normal respiratory effort HEART: regular rate & rhythm ABDOMEN:  normoactive bowel sounds , non tender, not distended. No palpable hepatosplenomegaly.  Extremity: no pedal edema PSYCH: alert & oriented x 3 with fluent speech NEURO: no focal motor/sensory deficits   LABORATORY DATA:  I have reviewed the data as listed  . CBC Latest Ref Rng & Units 05/15/2019 05/01/2019 04/17/2019  WBC 4.0 - 10.5 K/uL 6.2 5.7 4.8  Hemoglobin 12.0 - 15.0 g/dL 11.1(L) 10.3(L) 9.7(L)  Hematocrit 36.0 - 46.0 % 36.4 33.4(L) 31.1(L)  Platelets 150 - 400 K/uL 210 191 180   ANC 1.8k . CMP Latest Ref Rng & Units 05/15/2019 05/01/2019 04/17/2019  Glucose 70 - 99 mg/dL 113(H) 103(H) 97  BUN 8 - 23 mg/dL _0 Creatinine 0.44 - 1.00 mg/dL 1.09(H) 1.00 0.98  Sodium 135 - 145 mmol/L 140 136 137  Potassium 3.5 - 5.1 mmol/L 5.0 4.5 3.3(L)  Chloride 98 - 111 mmol/L 111 106 105  CO2 22 - 32 mmol/L _1 Calcium 8.9 - 10.3 mg/dL 8.6(L) 8.8(L) 7.9(L)  Total Protein 6.5 - 8.1 g/dL 7.4 6.8 6.4(L)  Total Bilirubin 0.3 - 1.2 mg/dL 0.3 0.4 0.5  Alkaline Phos 38 - 126 U/L 100 89 78  AST 15 - 41 U/L _2 ALT 0 - 44 U/L _3 01/02/19 Esophagus Biopsy:    RADIOGRAPHIC STUDIES:  .Dg Chest 2 View  Result Date: 04/16/2019 CLINICAL DATA:  Dyspnea on exertion EXAM: CHEST - 2 VIEW COMPARISON:  03/06/2019 FINDINGS: COPD with hyperinflation of the lungs  and scarring in the bases. No superimposed acute infiltrate or effusion. Negative for heart failure. Heart size is normal. Atherosclerotic disease in the aortic arch. Port-A-Cath tip in the SVC in good position. IMPRESSION: COPD and scarring. No superimposed acute abnormality and no interval change from the prior study. Electronically Signed   By: Franchot Gallo M.D.   On: 04/16/2019 07:46    ASSESSMENT & PLAN:   78 y.o. female with  #1 Metastatic poorly differentiated carcinoma with likely lung primary non-small cell lung cancer.   CT of the head with and without contrast showed no evidence of metastatic disease. EGFR blood test mutation analysis negative. CT chest abdomen pelvis 04/19/2016 shows no evidence of disease progression. Patient tolerated Nivolumab very well but was discontinued when she developed grade 2 Immune colitis. Has been off Nivolumab for >6 months  CT chest abdomen pelvis on 06/24/2016 shows no evidence of new disease or progression of metastatic disease. CT chest abdomen pelvis 09/06/2016 shows 1. Mixed interval response to therapy. 2. There is a new left ventral chest wall lesion deep to the pectoralis musculature worrisome for metastatic disease. 3. Posterior lower lobe nodular densities are identified which may reflect areas of pulmonary metastasis.  4. Interval decrease in size of destructive lesion involving the left iliac bone.  CT chest abd pelvis 12/08/2016: Cystic mass involving the left ventral chest wall has resolved in the interval. Likely was a hematoma due to trauma. Interval increase in size of pleural base mass overlying the posterior and inferior left lower lobe. There is also a new left pleural effusion identified.  CT chest 02/01/2017: Residual irregular soft tissue thickening/volume loss and trace left pleural fluid at the base of the left hemithorax, overall improved in appearance from 12/08/2016. No measurable lesion.  CT chest 05/29/2017 shows no residual  pleural based mass or significant pleural effusion in the left hemithorax. No evidence of thoracic metastatic disease. No evidence of progressive metastatic disease within the abdomen or pelvis. Mixed lytic and blastic lesion involving the left iliac bone and associated pathologic fracture are unchanged.   CT CAP 09/14/17 shows no new changes. She does have slight displacement of her fractured left iliac bone. Evidence of stable disease.   CT CAP 01/04/2018- No new or progressive metastatic disease. Stable large left iliac bone metastasis with associated chronic pathologic fracture.   CT chest/abd/pelvis done on 04/26/18 revealed Stable exam.  No new or progressive interval findings.  07/19/18 CT C/A/P revealed Stable exam.  No new or progressive interval findings. Large destructive left iliac lesion is similar to prior. Aortic Atherosclerosis and Emphysema.    11/06/18 CT C/A/P revealed Similar appearance of large mixed lytic and sclerotic lesion in the left ilium. No new metastatic lesions are otherwise noted elsewhere in the chest, abdomen or pelvis. 2. Interval development of thickening of the distal third of the esophagus. This is nonspecific, and could be related to underlying reflux esophagitis. However, if there is any clinical concern for Barrett's metaplasia or esophageal neoplasia, further evaluation with nonemergent endoscopy could be considered. 3. Aortic atherosclerosis, in addition to left main coronary artery disease. Assessment for potential risk factor modification, dietary therapy or pharmacologic therapy may be warranted, if clinically Indicated. 4. Diffuse bronchial wall thickening with mild to moderate centrilobular and paraseptal emphysema; imaging findings suggestive of underlying COPD. 5. Additional incidental findings, as above.   #2 Newly diagnosed Adenocarcinoma of the esophagus  Barrett's esophagus 4cms in the distal esophagus with low and high-grade dysplasia  01/02/19  Surgical pathology revealed adenocarcinoma of the esophagus   01/25/19 PET/CT revealed Distal esophageal primary, without hypermetabolic metastatic disease. 2. Chronic left iliac metastasis, as before. 3. Hypermetabolism within and superficial to the right gluteal musculature is most likely related to trauma and/or injection sites. 4. Aortic atherosclerosis, coronary artery atherosclerosis and emphysema.  S/p concurrent Carboplatin and Taxol weekly with RT of 45 Gy in 25 fractions and 5.4 Gy boost, completed between 02/04/19 and 03/27/19  #3 diarrhea-  now resolved was previously. 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 She was previously on on Lialda, budesonide,probiotics and lomotil but not currently taking any of these. Plan -Continue Sandostatin every 4 weeks   #4 h/o diverticulitis and c diff colitis - now resolved Plan  -continue on sandostatin today and q4weeks.  -continue on Lialda  #5 DVT and PE  -continue on Xarelto - no issues with bleeding   #6 Dsypnea 03/14/19 ECHO revealed LV EF of 60-65% 03/06/19 CXR revealed clear lung fields, normal heart size 03/07/19 and 02/25/19 EKGs, no overt concern but some decreased QRS amplitude Did refer to pulmonology  for further evaluation and lung function testing  Began steroid inhaler to mitigate possible inflammation in airway, could be some radiation related scarring and emphysema  PLAN: -Discussed pt labwork today, 05/15/19; blood counts improved, chemistries are stable. Magnesium at 1.4 -Continue PO Magnesium replacement BID -Continued to stress the importance and necessity of continually optimizing nutritional status and increasing functional status. Pt has improved on these front over the last two weeks. -Recommend purchasing good weighing scale at home, and recommend weighing herself at home -Continue 25m Marinol  BID -Recommend speaking with home care to determine qualification for meals on wheels -Follow up with nutritional therapist BErnestene Kielfor consultation as well -Continue PT at home -Proceed with IVF and will continue IVF at home 3 times per week -Will continue monthly Sandostatin -Hold Amlodipine as BP has been low and pt has been eating worse -Follow up with home health services -Continue with Xgeva every 4 weeks, no dental concerns at this time -Continue 2000 units Vitamin D every day, with dose adjustment to maintain 25OH Vit D levels of 40-60 -Trazodone for sleep difficulty -Recommended that the pt continue to eat well, drink at least 48-64 oz of water each day, and walk 20-30 minutes each day. -Will see the pt back in 4 weeks   -RTC with Dr KIrene Limbowith labs in 4 weeks -continue Xgeva and Sandostatin q4weeks   All questions were answered. The patient knows to call the clinic with any problems, questions or concerns.  The total time spent in the appt was 25 minutes and more than 50% was on counseling and direct patient cares.   GSullivan LoneMD MS Hematology/Oncology Physician [facility] (Office): [phone](Work cell): [phone](Fax): [phone] I, SBaldwin [place] am acting as a scribe for Dr. GSullivan Lone   .I have reviewed the above documentation for accuracy and completeness, and I agree with the above. .Brunetta GeneraMD

## 2019-05-15 ENCOUNTER — Inpatient Hospital Stay (HOSPITAL_BASED_OUTPATIENT_CLINIC_OR_DEPARTMENT_OTHER): Payer: Medicare Other | Admitting: Hematology

## 2019-05-15 ENCOUNTER — Inpatient Hospital Stay: Payer: Medicare Other | Attending: Hematology

## 2019-05-15 ENCOUNTER — Other Ambulatory Visit: Payer: Self-pay

## 2019-05-15 VITALS — BP 146/72 | HR 100 | Temp 98.5°F | Resp 17 | Ht 68.5 in | Wt 129.8 lb

## 2019-05-15 DIAGNOSIS — R42 Dizziness and giddiness: Secondary | ICD-10-CM | POA: Insufficient documentation

## 2019-05-15 DIAGNOSIS — J90 Pleural effusion, not elsewhere classified: Secondary | ICD-10-CM | POA: Diagnosis not present

## 2019-05-15 DIAGNOSIS — I2510 Atherosclerotic heart disease of native coronary artery without angina pectoris: Secondary | ICD-10-CM | POA: Diagnosis not present

## 2019-05-15 DIAGNOSIS — Z87891 Personal history of nicotine dependence: Secondary | ICD-10-CM

## 2019-05-15 DIAGNOSIS — G479 Sleep disorder, unspecified: Secondary | ICD-10-CM | POA: Insufficient documentation

## 2019-05-15 DIAGNOSIS — C155 Malignant neoplasm of lower third of esophagus: Secondary | ICD-10-CM | POA: Insufficient documentation

## 2019-05-15 DIAGNOSIS — C774 Secondary and unspecified malignant neoplasm of inguinal and lower limb lymph nodes: Secondary | ICD-10-CM | POA: Diagnosis not present

## 2019-05-15 DIAGNOSIS — C3491 Malignant neoplasm of unspecified part of right bronchus or lung: Secondary | ICD-10-CM | POA: Diagnosis not present

## 2019-05-15 DIAGNOSIS — R1310 Dysphagia, unspecified: Secondary | ICD-10-CM | POA: Diagnosis not present

## 2019-05-15 DIAGNOSIS — E559 Vitamin D deficiency, unspecified: Secondary | ICD-10-CM

## 2019-05-15 DIAGNOSIS — I700 Atherosclerosis of aorta: Secondary | ICD-10-CM | POA: Insufficient documentation

## 2019-05-15 DIAGNOSIS — J449 Chronic obstructive pulmonary disease, unspecified: Secondary | ICD-10-CM | POA: Insufficient documentation

## 2019-05-15 DIAGNOSIS — F17210 Nicotine dependence, cigarettes, uncomplicated: Secondary | ICD-10-CM | POA: Diagnosis not present

## 2019-05-15 DIAGNOSIS — Z79899 Other long term (current) drug therapy: Secondary | ICD-10-CM | POA: Diagnosis not present

## 2019-05-15 DIAGNOSIS — I82401 Acute embolism and thrombosis of unspecified deep veins of right lower extremity: Secondary | ICD-10-CM | POA: Insufficient documentation

## 2019-05-15 DIAGNOSIS — I10 Essential (primary) hypertension: Secondary | ICD-10-CM | POA: Insufficient documentation

## 2019-05-15 DIAGNOSIS — K589 Irritable bowel syndrome without diarrhea: Secondary | ICD-10-CM | POA: Insufficient documentation

## 2019-05-15 DIAGNOSIS — E785 Hyperlipidemia, unspecified: Secondary | ICD-10-CM | POA: Diagnosis not present

## 2019-05-15 DIAGNOSIS — I2699 Other pulmonary embolism without acute cor pulmonale: Secondary | ICD-10-CM | POA: Insufficient documentation

## 2019-05-15 DIAGNOSIS — K219 Gastro-esophageal reflux disease without esophagitis: Secondary | ICD-10-CM | POA: Insufficient documentation

## 2019-05-15 DIAGNOSIS — C7951 Secondary malignant neoplasm of bone: Secondary | ICD-10-CM

## 2019-05-15 DIAGNOSIS — Z800 Family history of malignant neoplasm of digestive organs: Secondary | ICD-10-CM | POA: Insufficient documentation

## 2019-05-15 DIAGNOSIS — Z7901 Long term (current) use of anticoagulants: Secondary | ICD-10-CM

## 2019-05-15 LAB — CBC WITH DIFFERENTIAL/PLATELET
Abs Immature Granulocytes: 0.02 10*3/uL (ref 0.00–0.07)
Basophils Absolute: 0.1 10*3/uL (ref 0.0–0.1)
Basophils Relative: 1 %
Eosinophils Absolute: 0.1 10*3/uL (ref 0.0–0.5)
Eosinophils Relative: 2 %
HCT: 36.4 % (ref 36.0–46.0)
Hemoglobin: 11.1 g/dL — ABNORMAL LOW (ref 12.0–15.0)
Immature Granulocytes: 0 %
Lymphocytes Relative: 24 %
Lymphs Abs: 1.5 10*3/uL (ref 0.7–4.0)
MCH: 28.5 pg (ref 26.0–34.0)
MCHC: 30.5 g/dL (ref 30.0–36.0)
MCV: 93.3 fL (ref 80.0–100.0)
Monocytes Absolute: 0.6 10*3/uL (ref 0.1–1.0)
Monocytes Relative: 10 %
Neutro Abs: 3.9 10*3/uL (ref 1.7–7.7)
Neutrophils Relative %: 63 %
Platelets: 210 10*3/uL (ref 150–400)
RBC: 3.9 MIL/uL (ref 3.87–5.11)
RDW: 16.3 % — ABNORMAL HIGH (ref 11.5–15.5)
WBC: 6.2 10*3/uL (ref 4.0–10.5)
nRBC: 0 % (ref 0.0–0.2)

## 2019-05-15 LAB — CMP (CANCER CENTER ONLY)
ALT: 10 U/L (ref 0–44)
AST: 29 U/L (ref 15–41)
Albumin: 2.6 g/dL — ABNORMAL LOW (ref 3.5–5.0)
Alkaline Phosphatase: 100 U/L (ref 38–126)
Anion gap: 7 (ref 5–15)
BUN: 14 mg/dL (ref 8–23)
CO2: 22 mmol/L (ref 22–32)
Calcium: 8.6 mg/dL — ABNORMAL LOW (ref 8.9–10.3)
Chloride: 111 mmol/L (ref 98–111)
Creatinine: 1.09 mg/dL — ABNORMAL HIGH (ref 0.44–1.00)
GFR, Est AFR Am: 56 mL/min — ABNORMAL LOW (ref >60)
GFR, Estimated: 48 mL/min — ABNORMAL LOW (ref >60)
Glucose, Bld: 113 mg/dL — ABNORMAL HIGH (ref 70–99)
Potassium: 5 mmol/L (ref 3.5–5.1)
Sodium: 140 mmol/L (ref 135–145)
Total Bilirubin: 0.3 mg/dL (ref 0.3–1.2)
Total Protein: 7.4 g/dL (ref 6.5–8.1)

## 2019-05-15 LAB — MAGNESIUM: Magnesium: 1.4 mg/dL — CL (ref 1.7–2.4)

## 2019-05-16 ENCOUNTER — Telehealth: Payer: Self-pay | Admitting: Hematology

## 2019-05-16 NOTE — Telephone Encounter (Signed)
Scheduled appt per 6/10 los. Was not able to reach the patient, a calendar will be mailed out. °

## 2019-05-17 DIAGNOSIS — Z86711 Personal history of pulmonary embolism: Secondary | ICD-10-CM | POA: Diagnosis not present

## 2019-05-17 DIAGNOSIS — C160 Malignant neoplasm of cardia: Secondary | ICD-10-CM | POA: Diagnosis not present

## 2019-05-17 DIAGNOSIS — K219 Gastro-esophageal reflux disease without esophagitis: Secondary | ICD-10-CM | POA: Diagnosis not present

## 2019-05-17 DIAGNOSIS — M84550D Pathological fracture in neoplastic disease, pelvis, subsequent encounter for fracture with routine healing: Secondary | ICD-10-CM | POA: Diagnosis not present

## 2019-05-17 DIAGNOSIS — G893 Neoplasm related pain (acute) (chronic): Secondary | ICD-10-CM | POA: Diagnosis not present

## 2019-05-17 DIAGNOSIS — Z7901 Long term (current) use of anticoagulants: Secondary | ICD-10-CM | POA: Diagnosis not present

## 2019-05-17 DIAGNOSIS — E785 Hyperlipidemia, unspecified: Secondary | ICD-10-CM | POA: Diagnosis not present

## 2019-05-17 DIAGNOSIS — C3490 Malignant neoplasm of unspecified part of unspecified bronchus or lung: Secondary | ICD-10-CM | POA: Diagnosis not present

## 2019-05-17 DIAGNOSIS — R918 Other nonspecific abnormal finding of lung field: Secondary | ICD-10-CM | POA: Diagnosis not present

## 2019-05-17 DIAGNOSIS — I2510 Atherosclerotic heart disease of native coronary artery without angina pectoris: Secondary | ICD-10-CM | POA: Diagnosis not present

## 2019-05-17 DIAGNOSIS — C7951 Secondary malignant neoplasm of bone: Secondary | ICD-10-CM | POA: Diagnosis not present

## 2019-05-17 DIAGNOSIS — K2270 Barrett's esophagus without dysplasia: Secondary | ICD-10-CM | POA: Diagnosis not present

## 2019-05-17 DIAGNOSIS — J438 Other emphysema: Secondary | ICD-10-CM | POA: Diagnosis not present

## 2019-05-17 DIAGNOSIS — I10 Essential (primary) hypertension: Secondary | ICD-10-CM | POA: Diagnosis not present

## 2019-05-17 DIAGNOSIS — K589 Irritable bowel syndrome without diarrhea: Secondary | ICD-10-CM | POA: Diagnosis not present

## 2019-05-17 DIAGNOSIS — E559 Vitamin D deficiency, unspecified: Secondary | ICD-10-CM | POA: Diagnosis not present

## 2019-05-17 DIAGNOSIS — J432 Centrilobular emphysema: Secondary | ICD-10-CM | POA: Diagnosis not present

## 2019-05-17 DIAGNOSIS — I700 Atherosclerosis of aorta: Secondary | ICD-10-CM | POA: Diagnosis not present

## 2019-05-20 DIAGNOSIS — I10 Essential (primary) hypertension: Secondary | ICD-10-CM | POA: Diagnosis not present

## 2019-05-20 DIAGNOSIS — C7951 Secondary malignant neoplasm of bone: Secondary | ICD-10-CM | POA: Diagnosis not present

## 2019-05-20 DIAGNOSIS — Z7901 Long term (current) use of anticoagulants: Secondary | ICD-10-CM | POA: Diagnosis not present

## 2019-05-20 DIAGNOSIS — J438 Other emphysema: Secondary | ICD-10-CM | POA: Diagnosis not present

## 2019-05-20 DIAGNOSIS — K589 Irritable bowel syndrome without diarrhea: Secondary | ICD-10-CM | POA: Diagnosis not present

## 2019-05-20 DIAGNOSIS — Z86711 Personal history of pulmonary embolism: Secondary | ICD-10-CM | POA: Diagnosis not present

## 2019-05-20 DIAGNOSIS — K2270 Barrett's esophagus without dysplasia: Secondary | ICD-10-CM | POA: Diagnosis not present

## 2019-05-20 DIAGNOSIS — E559 Vitamin D deficiency, unspecified: Secondary | ICD-10-CM | POA: Diagnosis not present

## 2019-05-20 DIAGNOSIS — K219 Gastro-esophageal reflux disease without esophagitis: Secondary | ICD-10-CM | POA: Diagnosis not present

## 2019-05-20 DIAGNOSIS — R918 Other nonspecific abnormal finding of lung field: Secondary | ICD-10-CM | POA: Diagnosis not present

## 2019-05-20 DIAGNOSIS — E785 Hyperlipidemia, unspecified: Secondary | ICD-10-CM | POA: Diagnosis not present

## 2019-05-20 DIAGNOSIS — G893 Neoplasm related pain (acute) (chronic): Secondary | ICD-10-CM | POA: Diagnosis not present

## 2019-05-20 DIAGNOSIS — M84550D Pathological fracture in neoplastic disease, pelvis, subsequent encounter for fracture with routine healing: Secondary | ICD-10-CM | POA: Diagnosis not present

## 2019-05-20 DIAGNOSIS — C3490 Malignant neoplasm of unspecified part of unspecified bronchus or lung: Secondary | ICD-10-CM | POA: Diagnosis not present

## 2019-05-20 DIAGNOSIS — I2510 Atherosclerotic heart disease of native coronary artery without angina pectoris: Secondary | ICD-10-CM | POA: Diagnosis not present

## 2019-05-20 DIAGNOSIS — C160 Malignant neoplasm of cardia: Secondary | ICD-10-CM | POA: Diagnosis not present

## 2019-05-20 DIAGNOSIS — J432 Centrilobular emphysema: Secondary | ICD-10-CM | POA: Diagnosis not present

## 2019-05-20 DIAGNOSIS — I700 Atherosclerosis of aorta: Secondary | ICD-10-CM | POA: Diagnosis not present

## 2019-05-23 ENCOUNTER — Telehealth: Payer: Self-pay | Admitting: *Deleted

## 2019-05-23 NOTE — Telephone Encounter (Signed)
Notified by Morey Hummingbird, nurse with [facility] that patient's last fluid infusion was this week and last Home Health visit is Friday based on current orders. Dr. Irene Limbo given information.

## 2019-05-24 DIAGNOSIS — E559 Vitamin D deficiency, unspecified: Secondary | ICD-10-CM | POA: Diagnosis not present

## 2019-05-24 DIAGNOSIS — I2510 Atherosclerotic heart disease of native coronary artery without angina pectoris: Secondary | ICD-10-CM | POA: Diagnosis not present

## 2019-05-24 DIAGNOSIS — C3490 Malignant neoplasm of unspecified part of unspecified bronchus or lung: Secondary | ICD-10-CM | POA: Diagnosis not present

## 2019-05-24 DIAGNOSIS — J432 Centrilobular emphysema: Secondary | ICD-10-CM | POA: Diagnosis not present

## 2019-05-24 DIAGNOSIS — C160 Malignant neoplasm of cardia: Secondary | ICD-10-CM | POA: Diagnosis not present

## 2019-05-24 DIAGNOSIS — M84550D Pathological fracture in neoplastic disease, pelvis, subsequent encounter for fracture with routine healing: Secondary | ICD-10-CM | POA: Diagnosis not present

## 2019-05-24 DIAGNOSIS — G893 Neoplasm related pain (acute) (chronic): Secondary | ICD-10-CM | POA: Diagnosis not present

## 2019-05-24 DIAGNOSIS — K2270 Barrett's esophagus without dysplasia: Secondary | ICD-10-CM | POA: Diagnosis not present

## 2019-05-24 DIAGNOSIS — I700 Atherosclerosis of aorta: Secondary | ICD-10-CM | POA: Diagnosis not present

## 2019-05-24 DIAGNOSIS — Z7901 Long term (current) use of anticoagulants: Secondary | ICD-10-CM | POA: Diagnosis not present

## 2019-05-24 DIAGNOSIS — Z86711 Personal history of pulmonary embolism: Secondary | ICD-10-CM | POA: Diagnosis not present

## 2019-05-24 DIAGNOSIS — R918 Other nonspecific abnormal finding of lung field: Secondary | ICD-10-CM | POA: Diagnosis not present

## 2019-05-24 DIAGNOSIS — K589 Irritable bowel syndrome without diarrhea: Secondary | ICD-10-CM | POA: Diagnosis not present

## 2019-05-24 DIAGNOSIS — I10 Essential (primary) hypertension: Secondary | ICD-10-CM | POA: Diagnosis not present

## 2019-05-24 DIAGNOSIS — E785 Hyperlipidemia, unspecified: Secondary | ICD-10-CM | POA: Diagnosis not present

## 2019-05-24 DIAGNOSIS — J438 Other emphysema: Secondary | ICD-10-CM | POA: Diagnosis not present

## 2019-05-24 DIAGNOSIS — K219 Gastro-esophageal reflux disease without esophagitis: Secondary | ICD-10-CM | POA: Diagnosis not present

## 2019-05-24 DIAGNOSIS — C7951 Secondary malignant neoplasm of bone: Secondary | ICD-10-CM | POA: Diagnosis not present

## 2019-05-27 ENCOUNTER — Ambulatory Visit: Payer: Medicare Other | Admitting: Internal Medicine

## 2019-05-28 ENCOUNTER — Other Ambulatory Visit: Payer: Self-pay | Admitting: Family Medicine

## 2019-05-28 NOTE — Telephone Encounter (Signed)
Good Afternoon Tonya, Her last OV here 11/2018 she was on Amlodipine 10mg  QD Please ask when she last took Rx Please ask what her BP and HR have been running. Please also have her schedule Telemedicine Visit for chronic f/u Thanks! Valetta Fuller

## 2019-05-28 NOTE — Telephone Encounter (Signed)
I'm unsure whether this medication should be refilled.  It looks as if it was marked "discontinued" in her chart, but I'm not sure who discontinued it.  She has not seen [place] since 12/03/2018 and that visit was for depression.  Please review and refill if appropriate.  [name], CMA

## 2019-05-29 ENCOUNTER — Inpatient Hospital Stay: Payer: Medicare Other

## 2019-05-29 ENCOUNTER — Other Ambulatory Visit: Payer: Self-pay | Admitting: *Deleted

## 2019-05-29 ENCOUNTER — Other Ambulatory Visit: Payer: Self-pay

## 2019-05-29 VITALS — BP 140/74 | HR 94 | Temp 98.2°F | Resp 18

## 2019-05-29 DIAGNOSIS — R1310 Dysphagia, unspecified: Secondary | ICD-10-CM | POA: Diagnosis not present

## 2019-05-29 DIAGNOSIS — K589 Irritable bowel syndrome without diarrhea: Secondary | ICD-10-CM | POA: Diagnosis not present

## 2019-05-29 DIAGNOSIS — I700 Atherosclerosis of aorta: Secondary | ICD-10-CM | POA: Diagnosis not present

## 2019-05-29 DIAGNOSIS — C3491 Malignant neoplasm of unspecified part of right bronchus or lung: Secondary | ICD-10-CM

## 2019-05-29 DIAGNOSIS — C155 Malignant neoplasm of lower third of esophagus: Secondary | ICD-10-CM

## 2019-05-29 DIAGNOSIS — I2699 Other pulmonary embolism without acute cor pulmonale: Secondary | ICD-10-CM | POA: Diagnosis not present

## 2019-05-29 DIAGNOSIS — R42 Dizziness and giddiness: Secondary | ICD-10-CM | POA: Diagnosis not present

## 2019-05-29 DIAGNOSIS — I82401 Acute embolism and thrombosis of unspecified deep veins of right lower extremity: Secondary | ICD-10-CM | POA: Diagnosis not present

## 2019-05-29 DIAGNOSIS — C774 Secondary and unspecified malignant neoplasm of inguinal and lower limb lymph nodes: Secondary | ICD-10-CM | POA: Diagnosis not present

## 2019-05-29 DIAGNOSIS — Z800 Family history of malignant neoplasm of digestive organs: Secondary | ICD-10-CM | POA: Diagnosis not present

## 2019-05-29 DIAGNOSIS — Z79899 Other long term (current) drug therapy: Secondary | ICD-10-CM | POA: Diagnosis not present

## 2019-05-29 DIAGNOSIS — I10 Essential (primary) hypertension: Secondary | ICD-10-CM | POA: Diagnosis not present

## 2019-05-29 DIAGNOSIS — I2510 Atherosclerotic heart disease of native coronary artery without angina pectoris: Secondary | ICD-10-CM | POA: Diagnosis not present

## 2019-05-29 DIAGNOSIS — E785 Hyperlipidemia, unspecified: Secondary | ICD-10-CM | POA: Diagnosis not present

## 2019-05-29 DIAGNOSIS — J90 Pleural effusion, not elsewhere classified: Secondary | ICD-10-CM | POA: Diagnosis not present

## 2019-05-29 DIAGNOSIS — K219 Gastro-esophageal reflux disease without esophagitis: Secondary | ICD-10-CM | POA: Diagnosis not present

## 2019-05-29 DIAGNOSIS — J449 Chronic obstructive pulmonary disease, unspecified: Secondary | ICD-10-CM | POA: Diagnosis not present

## 2019-05-29 DIAGNOSIS — C7951 Secondary malignant neoplasm of bone: Secondary | ICD-10-CM | POA: Diagnosis not present

## 2019-05-29 DIAGNOSIS — G479 Sleep disorder, unspecified: Secondary | ICD-10-CM | POA: Diagnosis not present

## 2019-05-29 LAB — CBC WITH DIFFERENTIAL (CANCER CENTER ONLY)
Abs Immature Granulocytes: 0.02 10*3/uL (ref 0.00–0.07)
Basophils Absolute: 0 10*3/uL (ref 0.0–0.1)
Basophils Relative: 1 %
Eosinophils Absolute: 0.1 10*3/uL (ref 0.0–0.5)
Eosinophils Relative: 2 %
HCT: 35.6 % — ABNORMAL LOW (ref 36.0–46.0)
Hemoglobin: 11 g/dL — ABNORMAL LOW (ref 12.0–15.0)
Immature Granulocytes: 0 %
Lymphocytes Relative: 25 %
Lymphs Abs: 1.3 10*3/uL (ref 0.7–4.0)
MCH: 28.6 pg (ref 26.0–34.0)
MCHC: 30.9 g/dL (ref 30.0–36.0)
MCV: 92.7 fL (ref 80.0–100.0)
Monocytes Absolute: 0.5 10*3/uL (ref 0.1–1.0)
Monocytes Relative: 9 %
Neutro Abs: 3.2 10*3/uL (ref 1.7–7.7)
Neutrophils Relative %: 63 %
Platelet Count: 190 10*3/uL (ref 150–400)
RBC: 3.84 MIL/uL — ABNORMAL LOW (ref 3.87–5.11)
RDW: 15.4 % (ref 11.5–15.5)
WBC Count: 5.2 10*3/uL (ref 4.0–10.5)
nRBC: 0 % (ref 0.0–0.2)

## 2019-05-29 LAB — CMP (CANCER CENTER ONLY)
ALT: 11 U/L (ref 0–44)
AST: 28 U/L (ref 15–41)
Albumin: 2.6 g/dL — ABNORMAL LOW (ref 3.5–5.0)
Alkaline Phosphatase: 83 U/L (ref 38–126)
Anion gap: 6 (ref 5–15)
BUN: 17 mg/dL (ref 8–23)
CO2: 25 mmol/L (ref 22–32)
Calcium: 8.7 mg/dL — ABNORMAL LOW (ref 8.9–10.3)
Chloride: 110 mmol/L (ref 98–111)
Creatinine: 1.12 mg/dL — ABNORMAL HIGH (ref 0.44–1.00)
GFR, Est AFR Am: 54 mL/min — ABNORMAL LOW (ref >60)
GFR, Estimated: 47 mL/min — ABNORMAL LOW (ref >60)
Glucose, Bld: 148 mg/dL — ABNORMAL HIGH (ref 70–99)
Potassium: 4.9 mmol/L (ref 3.5–5.1)
Sodium: 141 mmol/L (ref 135–145)
Total Bilirubin: <0.2 mg/dL — ABNORMAL LOW (ref 0.3–1.2)
Total Protein: 7.1 g/dL (ref 6.5–8.1)

## 2019-05-29 LAB — MAGNESIUM: Magnesium: 1.6 mg/dL — ABNORMAL LOW (ref 1.7–2.4)

## 2019-05-29 MED ORDER — DENOSUMAB 120 MG/1.7ML ~~LOC~~ SOLN
120.0000 mg | Freq: Once | SUBCUTANEOUS | Status: AC
  Administered 2019-05-29: 120 mg via SUBCUTANEOUS

## 2019-05-29 MED ORDER — DENOSUMAB 120 MG/1.7ML ~~LOC~~ SOLN
SUBCUTANEOUS | Status: AC
  Filled 2019-05-29: qty 1.7

## 2019-05-29 NOTE — Patient Instructions (Signed)

## 2019-05-29 NOTE — Progress Notes (Signed)
Per Dr Irene Limbo ok to give [facility] with calcium 8.7

## 2019-05-30 NOTE — Telephone Encounter (Signed)
No answer at home number.  Mobile number said that VM box is full and cannot accept messages.  [name], CMA

## 2019-05-30 NOTE — Telephone Encounter (Signed)
Pt states that Dr. Irene Limbo, oncology, temporarily discontinued this medication because her blood pressure was running so low.  Pt states that Dr. Irene Limbo is keeping an eye on her readings and will decide when/if she can resume this medication.  Pt has f/u appt on 06/04/2019 with you.  [name], CMA

## 2019-06-03 ENCOUNTER — Other Ambulatory Visit: Payer: Self-pay | Admitting: *Deleted

## 2019-06-03 MED ORDER — MAGNESIUM OXIDE 400 (241.3 MG) MG PO TABS: 400.0000 mg | ORAL_TABLET | Freq: Every day | ORAL | 1 refills | Status: DC

## 2019-06-03 NOTE — Progress Notes (Signed)
Virtual Visit via Telephone Note  I connected with Cindy Byrd on 06/04/2019 at  3:15 PM EDT by telephone and verified that I am speaking with the correct person using two identifiers.  Location: Patient: [facility]   I discussed the limitations, risks, security and privacy concerns of performing an evaluation and management service by telephone and the availability of in person appointments. I also discussed with the patient that there may be a patient responsible charge related to this service. The patient expressed understanding and agreed to proceed.   History of Present Illness: 10/24/18 OV: Cindy Byrd is here for 6 month f/u:A fib, HTN She is followed closely by Oncology She reports only drinking one 16 oz coke/day She tries to follow heart healthy diet and denies any regular exercise She reports persistent anxiety/depression r/t Lung Ca She reports previously being on Prozac and tolerating it well She denies thoughts of harming herself/others She denies tobacco/vape/ETOH use She has CT scan schedule 11/06/18 Declined CBT referral today She reports mild dyspnea "when bending over", denies CP/palpitations/dizziness/HA  12/03/18 OV: Cindy Byrd is here for f/u: GAD and depression She was started on once daily Fluoxetine (Prozac) 10mg  5 weeks ago and reports "feeling more eager to get up and move" She reports even preparing Christmas dinner for her entire extended family. She would like to increase SSRI dosage, she denies thoughts of harming herself/others She walks as often as she can, limited by chronic L hip pain r/t cancer She again declined CBT referral  06/04/2019 OV: Cindy Byrd calls in today with regular f/u: HTN, Lung Ca with mets- esophagus, GERD, CKD, HLD, Depression She recently completed- 28 radiation sessions Six weeks of chemotherapy She reports steadily improving appetite- has regained some weight loss during ca treatment  Her Oncologist/Dr.  Irene Limbo- recently stopped her Amlodipine due to hypotension.  Per his notes, he will determine if she needs to re-start She reports feeling stronger each week and is "only upset that I lost my hair". She reports stable mood, denies SI/HI- currently on Fluoxetine 20mg  QD She is sleeping well with assistance with Trazodone 50mg  PRN QHS- she denies am grogginess. She continues to abstain from tobacco/vape/ETOH use   Patient Care Team    Relationship Specialty Notifications Start End  Mina Marble D, NP PCP - General Family Medicine  01/15/18   [name], MD Consulting Physician Hematology and Oncology Admissions 06/24/15     Patient Active Problem List   Diagnosis Date Noted  . DOE (dyspnea on exertion) 04/15/2019  . Malignant neoplasm of distal third of esophagus ([place]) 01/14/2019  . Counseling regarding advance care planning and goals of care 01/14/2019  . Abnormal finding on imaging   . Esophageal mass   . GAD (generalized anxiety disorder) 10/24/2018  . Hyperlipidemia 04/23/2018  . Chronic kidney disease 04/23/2018  . Elevated TSH 04/17/2018  . Cough 02/21/2018  . Elevated serum creatinine 02/21/2018  . Health care maintenance 01/15/2018  . Malignant neoplasm of lung ([place])   . Colitis 06/23/2017  . Left lower quadrant pain 06/23/2017  . Fracture of left iliac crest ([place]) 06/23/2017  . Metastatic lung cancer (metastasis from lung to other site), unspecified laterality ([place])   . Hypocalcemia 09/08/2016  . Vitamin D deficiency 09/08/2016  . New onset a-fib ([place]) 09/08/2016  . Chest pain   . Thrush of mouth and esophagus ([place])   . Protein-calorie malnutrition, severe ([place])   . Colitis determined by colorectal biopsy   . Bilateral  pulmonary embolism ([place]) 09/07/2016  . DVT of lower extremity, bilateral ([place]) 09/07/2016  . Palliative care by specialist   . Goals of care, counseling/discussion   . Advance care planning   . Diarrhea   . Ulcerative pancolitis without  complication ([place])   . Cancer ([place])   . Pressure injury of skin 09/02/2016  . HCAP (healthcare-associated pneumonia) 09/01/2016  . ARF (acute renal failure) ([place]) 09/01/2016  . Nausea with vomiting 07/18/2016  . Dehydration 07/18/2016  . Hypokalemia 07/18/2016  . Hypoalbuminemia due to protein-calorie malnutrition ([place]) 07/18/2016  . Peripheral edema 07/18/2016  . Diarrhea due to drug 05/19/2016  . Port catheter in place 04/07/2016  . Nicotine addiction 01/19/2016  . Barrett's esophagus determined by biopsy 09/04/2015  . Gastritis   . Primary malignant neoplasm of lung metastatic to other site ([place]) 08/06/2015  . Esophageal reflux 08/04/2015  . Bone metastasis ([place]) 06/24/2015  . Neoplasm related pain 06/24/2015  . Protein calorie malnutrition ([place]) 06/24/2015  . Anorexia 06/24/2015  . Heavy smoker (more than 20 cigarettes per day) 06/24/2015  . Depression 06/24/2015  . HTN (hypertension) 06/24/2015     Past Medical History:  Diagnosis Date  . Barrett's esophagus   . Bone neoplasm 06/24/2015  . Cancer [facility])    metastatic poorly differentiated carcinoma. tumor left groin surgical removal with radiation tx.  . Cataract    BILATERAL  . Cigarette smoker two packs a day or less    Currently still smoking 2 PPD - Not interested in quitting at this time.  . Colitis 2017  . Colon polyps    hyperplastic, tubular adenomas, tubulovillous adenoma  . Cough, persistent    hx. lung cancer ? primary-being evaluated, unsure of primary site.  . Depression 06/24/2015  . Diverticulosis   . Emphysema of lung ([place])   . Endometriosis    Hysterectomy with BSO at age 44 yrs  . Esophageal adenocarcinoma ([place]) 08/11/15   intramucosal  . Gastritis   . GERD (gastroesophageal reflux disease)   . H/O: pneumonia   . Hiatal hernia   . Hyperlipidemia   . Hypertension 06/24/2015   likely improved incidental to 40 lbs weight loss from her neoplasm. No Longer taking med for this as of 08-06-15  . IBS  (irritable bowel syndrome)   . Pain    left hip-persistent"tumor of bone"-radiation tx. 10.  . Vitamin D deficiency disease      Past Surgical History:  Procedure Laterality Date  . BARTHOLIN GLAND CYST EXCISION  78 yo ago   Does not want if it was an infected cyst or tumor. Was soon as delivery  . BIOPSY  01/02/2019   Procedure: BIOPSY;  Surgeon: [name], MD;  Location: [facility];  Service: Gastroenterology;;  . CATARACT EXTRACTION    . COLONOSCOPY W/ POLYPECTOMY     multiple times - last done 09/2014 per patient.  . ESOPHAGOGASTRODUODENOSCOPY (EGD) WITH PROPOFOL N/A 08/11/2015   Procedure: ESOPHAGOGASTRODUODENOSCOPY (EGD) WITH PROPOFOL;  Surgeon: [name], MD;  Location: [facility];  Service: Gastroenterology;  Laterality: N/A;  . ESOPHAGOGASTRODUODENOSCOPY (EGD) WITH PROPOFOL N/A 01/02/2019   Procedure: ESOPHAGOGASTRODUODENOSCOPY (EGD) WITH PROPOFOL;  Surgeon: [name], MD;  Location: [facility];  Service: Gastroenterology;  Laterality: N/A;  . FLEXIBLE SIGMOIDOSCOPY N/A 06/24/2017   Procedure: FLEXIBLE SIGMOIDOSCOPY;  Surgeon: Manus Gunning, MD;  Location: [facility];  Service: Gastroenterology;  Laterality: N/A;  . GANGLION CYST EXCISION    . KNEE ARTHROSCOPY  age about 69 yrs  .  TONSILLECTOMY    . TOTAL ABDOMINAL HYSTERECTOMY W/ BILATERAL SALPINGOOPHORECTOMY  at age 56 yrs   For endometriosis     Family History  Problem Relation Age of Onset  . Colon cancer Brother   . Colon cancer Brother   . Stroke Mother   . Colon cancer Father   . Emphysema Father        smoked  . Breast cancer Daughter 62       ER/PR+ stage II     Social History   Substance and Sexual Activity  Drug Use No     Social History   Substance and Sexual Activity  Alcohol Use No  . Alcohol/week: 0.0 standard drinks     Social History   Tobacco Use  Smoking Status Former Smoker  . Packs/day: 1.00  . Years: 60.00  . Pack years: 60.00  . Types: Cigarettes   . Quit date: 12/05/2014  . Years since quitting: 4.4  Smokeless Tobacco Never Used     Outpatient Encounter Medications as of 06/04/2019  Medication Sig Note  . amitriptyline (ELAVIL) 25 MG tablet Take 25 mg by mouth at bedtime.    . Cholecalciferol (VITAMIN D3) 25 MCG (1000 UT) CAPS Take 1,000 Units by mouth daily.   [name] dexamethasone (DECADRON) 2 MG tablet Take 1 tablet (2 mg total) by mouth daily with breakfast.   . FLUoxetine (PROZAC) 20 MG capsule Take 1 capsule (20 mg total) by mouth daily.   [name] KLOR-CON M20 20 MEQ tablet TAKE 1 TABLET BY MOUTH TWICE A DAY (Patient taking differently: Take 20 mEq by mouth daily. )   . lidocaine-prilocaine (EMLA) cream Apply to affected area once (Patient taking differently: Apply 1 application topically as needed (port access). Apply to affected area once)   . magnesium oxide (MAG-OX) 400 (241.3 Mg) MG tablet Take 1 tablet (400 mg total) by mouth daily.   [name] omeprazole (PRILOSEC) 40 MG capsule TAKE 1 CAPSULE BY MOUTH EVERY DAY   . Probiotic Product (PROBIOTIC PO) Take 1 capsule by mouth 2 (two) times daily.   . rivaroxaban (XARELTO) 20 MG TABS tablet Take 1 tablet (20 mg total) by mouth daily with supper.   . sucralfate (CARAFATE) 1 g tablet Take 1 tablet (1 g total) by mouth 4 (four) times daily. (Patient taking differently: Take 1 g by mouth daily. )   . traZODone (DESYREL) 50 MG tablet TAKE 1 TABLET BY MOUTH EVERYDAY AT BEDTIME (Patient taking differently: Take 50 mg by mouth at bedtime. )   . Vitamin D, Ergocalciferol, (DRISDOL) 1.25 MG (50000 UT) CAPS capsule Take 50,000 Units by mouth every 7 (seven) days.  04/01/2019: Wednesdays  . VITAMIN E PO Take 1 tablet by mouth daily.   . [DISCONTINUED] albuterol (PROVENTIL HFA;VENTOLIN HFA) 108 (90 Base) MCG/ACT inhaler Inhale 1 puff into the lungs every 6 (six) hours as needed for wheezing or shortness of breath.   . [DISCONTINUED] dronabinol (MARINOL) 5 MG capsule Take 1 capsule (5 mg total) by mouth 2 (two)  times daily before a meal.   . [DISCONTINUED] LORazepam (ATIVAN) 0.5 MG tablet Take 1 tablet (0.5 mg total) by mouth every 6 (six) hours as needed (Nausea or vomiting).   . [DISCONTINUED] magnesium oxide (MAG-OX) 400 (241.3 Mg) MG tablet Take 1 tablet (400 mg total) by mouth daily.   . [DISCONTINUED] nystatin (MYCOSTATIN) 100000 UNIT/ML suspension Take 5 mLs (500,000 Units total) by mouth 4 (four) times daily. Swish and swallow   . [DISCONTINUED] ondansetron (  ZOFRAN) 8 MG tablet Take 1 tablet (8 mg total) by mouth 2 (two) times daily as needed for refractory nausea / vomiting. Start on day 3 after chemo.   . [DISCONTINUED] prochlorperazine (COMPAZINE) 10 MG tablet Take 1 tablet (10 mg total) by mouth every 6 (six) hours as needed (Nausea or vomiting).    No facility-administered encounter medications on file as of 06/04/2019.     Allergies: Penicillins, Remeron [mirtazapine], and Latex  There is no height or weight on file to calculate BMI.  There were no vitals taken for this visit. Review of Systems: General:   Denies fever, chills, unexplained weight loss.  Optho/Auditory:   Denies visual changes, blurred vision/LOV Respiratory:   Denies SOB, DOE more than baseline levels.  Cardiovascular:   Denies chest pain, palpitations, new onset peripheral edema  Gastrointestinal:   Denies nausea, vomiting, diarrhea.  Genitourinary: Denies dysuria, freq/ urgency, flank pain or discharge from genitals.  Endocrine:     Denies hot or cold intolerance, polyuria, polydipsia. Musculoskeletal:   Denies unexplained myalgias, joint swelling, unexplained arthralgias, gait problems.  Skin:  Denies rash, suspicious lesions Neurological:     Denies dizziness, unexplained weakness, numbness  Psychiatric/Behavioral:   Denies mood changes, suicidal or homicidal ideations, hallucinations This patient does not have sx concerning for COVID-19 Infection (ie; fever, chills, cough, new or worsening shortness of  breath).    Observations/Objective: No acute distress noted during the telephone conversation.  Assessment and Plan: Continue with current medications. Remain well hydrated, eat a well balanced diet and remain as active as tolerated. Continue with close f/u with Oncology team Continue to social distance and wear a mask when out in public.  Follow Up Instructions: 5 months OV with fasting labs   I discussed the assessment and treatment plan with the patient. The patient was provided an opportunity to ask questions and all were answered. The patient agreed with the plan and demonstrated an understanding of the instructions.   The patient was advised to call back or seek an in-person evaluation if the symptoms worsen or if the condition fails to improve as anticipated.  I provided 18 minutes of non-face-to-face time during this encounter.   [name], NP

## 2019-06-04 ENCOUNTER — Ambulatory Visit (INDEPENDENT_AMBULATORY_CARE_PROVIDER_SITE_OTHER): Payer: Medicare Other | Admitting: Adult Health

## 2019-06-04 ENCOUNTER — Other Ambulatory Visit: Payer: Self-pay

## 2019-06-04 ENCOUNTER — Encounter: Payer: Self-pay | Admitting: Adult Health

## 2019-06-04 DIAGNOSIS — E785 Hyperlipidemia, unspecified: Secondary | ICD-10-CM | POA: Diagnosis not present

## 2019-06-04 DIAGNOSIS — I10 Essential (primary) hypertension: Secondary | ICD-10-CM | POA: Diagnosis not present

## 2019-06-04 DIAGNOSIS — R7989 Other specified abnormal findings of blood chemistry: Secondary | ICD-10-CM

## 2019-06-04 DIAGNOSIS — Z0000 Encounter for general adult medical examination without abnormal findings: Secondary | ICD-10-CM

## 2019-06-04 NOTE — Assessment & Plan Note (Signed)
Assessment and Plan: Continue with current medications. Remain well hydrated, eat a well balanced diet and remain as active as tolerated. Continue with close f/u with Oncology team Continue to social distance and wear a mask when out in public.  Follow Up Instructions: 5 months OV with fasting labs   I discussed the assessment and treatment plan with the patient. The patient was provided an opportunity to ask questions and all were answered. The patient agreed with the plan and demonstrated an understanding of the instructions.   The patient was advised to call back or seek an in-person evaluation if the symptoms worsen or if the condition fails to improve as anticipated.

## 2019-06-11 NOTE — Progress Notes (Signed)
HEMATOLOGY ONCOLOGY PROGRESS NOTE  Date of service:  06/12/19    Patient Care Team: [name], NP as PCP - General (Family Medicine) [name], MD as Consulting Physician (Hematology and Oncology)  CHIEF COMPLAINTS/PURPOSE OF CONSULTATION:  F/u for metastatic lung cancer  DIAGNOSIS:   #1 Metastatic non-small cell lung cancer with bilateral lung nodules and large metastatic lesion in the left Ilium. 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 Also had c diff colitis - treated   Current Treatment  1) Active surveillance 2) Xgeva 131m [place] q4weeks for bone metastases. 3) Sandostatin q4weeks for diarrhea - immune colitis  Previous Treatment  1 Palliative radiation therapy to the large left ilium metastases 2. IV Nivolumab x 20ycles (discontinued due to likely immune colitis) 3. Xgeva 1242mSC q4weeks for bone metastases.   HISTORY OF PRESENTING ILLNESS: (plz see my previous consultation for details of initial presentation)  INTERVAL HISTORY:   Cindy ToLupiens here for her scheduled follow-up for metastatic lung cancer, and adenocarcinoma of the esophagus. The patient's last visit with [place] on 05/15/2019. The pt reports that she is doing well overall.  The pt reports that she has been having good energy levels overall. She is feeling much less SOB than she previously was. She has continued to eat better, is cooking again, and has gained 3 pounds in the interim. The pt adds that she has been walking more and continues increasing her activity levels. She denies any diarrhea or abdominal pains.  Lab results today (06/12/2019) of CBC w/diff and CMP is as follows: all values are WNL except for HGB at 11.3, Potassium at 5.3, Creatinine at 1.01, Albumin at 3.0, Total Bilirubin at 0.2, GFR at 53. 06/12/19 Magnesium at 1.6  On review of systems, pt reports improved energy levels, improved breathing, eating  better, mild weight gain, increasing activity levels, and denies problems swallowing, concerns for infections, abdominal pains, leg swelling, diarrhea, and any other symptoms.    MEDICAL HISTORY:  Past Medical History:  Diagnosis Date  . Barrett's esophagus   . Bone neoplasm 06/24/2015  . [facility]   metastatic poorly differentiated carcinoma. tumor left groin surgical removal with radiation tx.  . Cataract    BILATERAL  . Cigarette smoker two packs a day or less    Currently still smoking 2 PPD - Not interested in quitting at this time.  . Colitis 2017  . Colon polyps    hyperplastic, tubular adenomas, tubulovillous adenoma  . Cough, persistent    hx. lung cancer ? primary-being evaluated, unsure of primary site.  . Depression 06/24/2015  . Diverticulosis   . [place] of lung ([facility]  . Endometriosis    Hysterectomy with BSO at age [age]rs  . [place]   intramucosal  . Gastritis   . GERD (gastroesophageal reflux disease)   . H/O: pneumonia   . Hiatal hernia   . Hyperlipidemia   . Hypertension 06/24/2015   likely improved incidental to 40 lbs weight loss from her neoplasm. No Longer taking med for this as of 08-06-15  . IBS (irritable bowel syndrome)   . Pain    left hip-persistent"tumor of bone"-radiation tx. 10.  . Vitamin D deficiency disease    SURGICAL HISTORY: Past Surgical History:  Procedure Laterality Date  . BARTHOLIN GLAND CYST EXCISION  5269o ago   Does not want if it was an infected  cyst or tumor. Was soon as delivery  . BIOPSY  01/02/2019   Procedure: BIOPSY;  Surgeon: [name], MD;  Location[facility];  Service: Gastroenterology;;  . CATARACT EXTRACTION    . COLONOSCOPY W/ POLYPECTOMY     multiple times - last done 09/2014 per patient.  . ESOPHAGOGASTRODUODENOSCOPY (EGD) WITH PROPOFOL N/A 08/11/2015   Procedure: ESOPHAGOGASTRODUODENOSCOPY (EGD) WITH PROPOFOL;  Surgeon: [name], MD;  Location: [facility];   Service: Gastroenterology;  Laterality: N/A;  . ESOPHAGOGASTRODUODENOSCOPY (EGD) WITH PROPOFOL N/A 01/02/2019   Procedure: ESOPHAGOGASTRODUODENOSCOPY (EGD) WITH PROPOFOL;  Surgeon: [name], MD;  Location: [facility];  Service: Gastroenterology;  Laterality: N/A;  . FLEXIBLE SIGMOIDOSCOPY N/A 06/24/2017   Procedure: FLEXIBLE SIGMOIDOSCOPY;  Surgeon: Manus Gunning, MD;  Location: [facility];  Service: Gastroenterology;  Laterality: N/A;  . GANGLION CYST EXCISION    . KNEE ARTHROSCOPY  age about 61 yrs  . TONSILLECTOMY    . TOTAL ABDOMINAL HYSTERECTOMY W/ BILATERAL SALPINGOOPHORECTOMY  at age 85 yrs   For endometriosis    SOCIAL HISTORY: Social History   Socioeconomic History  . Marital status: Widowed    Spouse name: Not on file  . Number of children: 2  . Years of education: Not on file  . Highest education level: Not on file  Occupational History  . Not on file  Social Needs  . Financial resource strain: Not on file  . Food insecurity    Worry: Not on file    Inability: Not on file  . Transportation needs    Medical: No    Non-medical: No  Tobacco Use  . Smoking status: Former Smoker    Packs/day: 1.00    Years: 60.00    Pack years: 60.00    Types: Cigarettes    Quit date: 12/05/2014    Years since quitting: 4.5  . Smokeless tobacco: Never Used  Substance and Sexual Activity  . Alcohol use: No    Alcohol/week: 0.0 standard drinks  . Drug use: No  . Sexual activity: Never  Lifestyle  . Physical activity    Days per week: Not on file    Minutes per session: Not on file  . Stress: Not on file  Relationships  . Social Herbalist on phone: Not on file    Gets together: Not on file    Attends religious service: Not on file    Active member of club or organization: Not on file    Attends meetings of clubs or organizations: Not on file    Relationship status: Not on file  . Intimate partner violence    Fear of current or ex partner: Not on  file    Emotionally abused: Not on file    Physically abused: Not on file    Forced sexual activity: Not on file  Other Topics Concern  . Not on file  Social History Narrative  . Not on file    FAMILY HISTORY: Family History  Problem Relation Age of Onset  . Colon cancer Brother   . Colon cancer Brother   . Stroke Mother   . Colon cancer Father   . Emphysema Father        smoked  . Breast cancer Daughter 3       ER/PR+ stage II    ALLERGIES:  is allergic to penicillins; remeron [mirtazapine]; and latex. patient wonders if she has a penicillin allergy but notes that she is uncertain about this.  MEDICATIONS:  Current Outpatient Medications  Medication Sig Dispense Refill  . amitriptyline (ELAVIL) 25 MG tablet Take 25 mg by mouth at bedtime.     . Cholecalciferol (VITAMIN D3) 25 MCG (1000 UT) CAPS Take 1,000 Units by mouth daily.    [name] dexamethasone (DECADRON) 2 MG tablet Take 1 tablet (2 mg total) by mouth daily with breakfast. 30 tablet 0  . FLUoxetine (PROZAC) 20 MG capsule Take 1 capsule (20 mg total) by mouth daily. 90 capsule 3  . KLOR-CON M20 20 MEQ tablet TAKE 1 TABLET BY MOUTH TWICE A DAY (Patient taking differently: Take 20 mEq by mouth daily. ) 60 tablet 1  . lidocaine-prilocaine (EMLA) cream Apply to affected area once (Patient taking differently: Apply 1 application topically as needed (port access). Apply to affected area once) 30 g 3  . magnesium oxide (MAG-OX) 400 (241.3 Mg) MG tablet Take 1 tablet (400 mg total) by mouth daily. 60 tablet 1  . omeprazole (PRILOSEC) 40 MG capsule TAKE 1 CAPSULE BY MOUTH EVERY DAY 90 capsule 0  . Probiotic Product (PROBIOTIC PO) Take 1 capsule by mouth 2 (two) times daily.    . rivaroxaban (XARELTO) 20 MG TABS tablet Take 1 tablet (20 mg total) by mouth daily with supper. 30 tablet 2  . sucralfate (CARAFATE) 1 g tablet Take 1 tablet (1 g total) by mouth 4 (four) times daily. (Patient taking differently: Take 1 g by mouth daily. ) 120  tablet 2  . traZODone (DESYREL) 50 MG tablet TAKE 1 TABLET BY MOUTH EVERYDAY AT BEDTIME (Patient taking differently: Take 50 mg by mouth at bedtime. ) 90 tablet 2  . Vitamin D, Ergocalciferol, (DRISDOL) 1.25 MG (50000 UT) CAPS capsule Take 50,000 Units by mouth every 7 (seven) days.     [name] VITAMIN E PO Take 1 tablet by mouth daily.     No current facility-administered medications for this visit.     REVIEW OF SYSTEMS:    A 10+ POINT REVIEW OF SYSTEMS WAS OBTAINED including neurology, dermatology, psychiatry, cardiac, respiratory, lymph, extremities, GI, GU, Musculoskeletal, constitutional, breasts, reproductive, HEENT.  All pertinent positives are noted in the HPI.  All others are negative.    PHYSICAL EXAMINATION: ECOG PERFORMANCE STATUS: 2 - Symptomatic, <50% confined to bed  Vitals:   06/12/19 1136  BP: (!) 143/73  Pulse: 96  Resp: 18  Temp: 98.3 F (36.8 C)  SpO2: 97%   Filed Weights   06/12/19 1136  Weight: 132 lb 6.4 oz (60.1 kg)  .  Wt Readings from Last 3 Encounters:  06/12/19 132 lb 6.4 oz (60.1 kg)  05/15/19 129 lb 12.8 oz (58.9 kg)  05/01/19 127 lb 9.6 oz (57.9 kg)    GENERAL:alert, in no acute distress and comfortable SKIN: no acute rashes, no significant lesions EYES: conjunctiva are pink and non-injected, sclera anicteric OROPHARYNX: MMM, no exudates, no oropharyngeal erythema or ulceration NECK: supple, no JVD LYMPH:  no palpable lymphadenopathy in the cervical, axillary or inguinal regions LUNGS: clear to auscultation b/l with normal respiratory effort HEART: regular rate & rhythm ABDOMEN:  normoactive bowel sounds , non tender, not distended. No palpable hepatosplenomegaly.  Extremity: no pedal edema PSYCH: alert & oriented x 3 with fluent speech NEURO: no focal motor/sensory deficits    LABORATORY DATA:  I have reviewed the data as listed  . CBC Latest Ref Rng & Units 06/12/2019 05/29/2019 05/15/2019  WBC 4.0 - 10.5 K/uL 5.5 5.2 6.2  Hemoglobin  12.0 - 15.0 g/dL  11.3(L) 11.0(L) 11.1(L)  Hematocrit 36.0 - 46.0 % 36.4 35.6(L) 36.4  Platelets 150 - 400 K/uL 183 190 210   ANC 1.8k . CMP Latest Ref Rng & Units 06/12/2019 05/29/2019 05/15/2019  Glucose 70 - 99 mg/dL 95 148(H) 113(H)  BUN 8 - 23 mg/dL 15 17 14   Creatinine 0.44 - 1.00 mg/dL 1.01(H) 1.12(H) 1.09(H)  Sodium 135 - 145 mmol/L 139 141 140  Potassium 3.5 - 5.1 mmol/L 5.3(H) 4.9 5.0  Chloride 98 - 111 mmol/L 108 110 111  CO2 22 - 32 mmol/L 26 25 22   Calcium 8.9 - 10.3 mg/dL 9.8 8.7(L) 8.6(L)  Total Protein 6.5 - 8.1 g/dL 7.4 7.1 7.4  Total Bilirubin 0.3 - 1.2 mg/dL 0.2(L) <0.2(L) 0.3  Alkaline Phos 38 - 126 U/L 76 83 100  AST 15 - 41 U/L 31 28 29   ALT 0 - 44 U/L 11 11 10        01/02/19 Esophagus Biopsy:    RADIOGRAPHIC STUDIES:  .No results found.  ASSESSMENT & PLAN:   78 y.o. female with  #1 Metastatic poorly differentiated carcinoma with likely lung primary non-small cell lung cancer.   CT of the head with and without contrast showed no evidence of metastatic disease. EGFR blood test mutation analysis negative. CT chest abdomen pelvis 04/19/2016 shows no evidence of disease progression. Patient tolerated Nivolumab very well but was discontinued when she developed grade 2 Immune colitis. Has been off Nivolumab for >6 months  CT chest abdomen pelvis on 06/24/2016 shows no evidence of new disease or progression of metastatic disease. CT chest abdomen pelvis 09/06/2016 shows 1. Mixed interval response to therapy. 2. There is a new left ventral chest wall lesion deep to the pectoralis musculature worrisome for metastatic disease. 3. Posterior lower lobe nodular densities are identified which may reflect areas of pulmonary metastasis. 4. Interval decrease in size of destructive lesion involving the left iliac bone.  CT chest abd pelvis 12/08/2016: Cystic mass involving the left ventral chest wall has resolved in the interval. Likely was a hematoma due to trauma. Interval  increase in size of pleural base mass overlying the posterior and inferior left lower lobe. There is also a new left pleural effusion identified.  CT chest 02/01/2017: Residual irregular soft tissue thickening/volume loss and trace left pleural fluid at the base of the left hemithorax, overall improved in appearance from 12/08/2016. No measurable lesion.  CT chest 05/29/2017 shows no residual pleural based mass or significant pleural effusion in the left hemithorax. No evidence of thoracic metastatic disease. No evidence of progressive metastatic disease within the abdomen or pelvis. Mixed lytic and blastic lesion involving the left iliac bone and associated pathologic fracture are unchanged.   CT CAP 09/14/17 shows no new changes. She does have slight displacement of her fractured left iliac bone. Evidence of stable disease.   CT CAP 01/04/2018- No new or progressive metastatic disease. Stable large left iliac bone metastasis with associated chronic pathologic fracture.   CT chest/abd/pelvis done on 04/26/18 revealed Stable exam.  No new or progressive interval findings.  07/19/18 CT C/A/P revealed Stable exam.  No new or progressive interval findings. Large destructive left iliac lesion is similar to prior. Aortic Atherosclerosis and Emphysema.    11/06/18 CT C/A/P revealed Similar appearance of large mixed lytic and sclerotic lesion in the left ilium. No new metastatic lesions are otherwise noted elsewhere in the chest, abdomen or pelvis. 2. Interval development of thickening of the distal third of the esophagus. This  is nonspecific, and could be related to underlying reflux esophagitis. However, if there is any clinical concern for Barrett's metaplasia or esophageal neoplasia, further evaluation with nonemergent endoscopy could be considered. 3. Aortic atherosclerosis, in addition to left main coronary artery disease. Assessment for potential risk factor modification, dietary therapy or pharmacologic  therapy may be warranted, if clinically Indicated. 4. Diffuse bronchial wall thickening with mild to moderate centrilobular and paraseptal emphysema; imaging findings suggestive of underlying COPD. 5. Additional incidental findings, as above.   #2 Newly diagnosed Adenocarcinoma of the esophagus  Barrett's esophagus 4cms in the distal esophagus with low and high-grade dysplasia  01/02/19 Surgical pathology revealed adenocarcinoma of the esophagus   01/25/19 PET/CT revealed Distal esophageal primary, without hypermetabolic metastatic disease. 2. Chronic left iliac metastasis, as before. 3. Hypermetabolism within and superficial to the right gluteal musculature is most likely related to trauma and/or injection sites. 4. Aortic atherosclerosis, coronary artery atherosclerosis and emphysema.  S/p concurrent Carboplatin and Taxol weekly with RT of 45 Gy in 25 fractions and 5.4 Gy boost, completed between 02/04/19 and 03/27/19  #3 diarrhea-  now resolved was previously. 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 She was previously on on Lialda, budesonide,probiotics and lomotil but not currently taking any of these. Plan -Continue Sandostatin every 4 weeks   #4 h/o diverticulitis and c diff colitis - now resolved Plan  -continue on sandostatin today and q4weeks.  -continue on Lialda  #5 DVT and PE  -continue on Xarelto - no issues with bleeding   #6 Dsypnea 03/14/19 ECHO revealed LV EF of 60-65% 03/06/19 CXR revealed clear lung fields, normal heart size 03/07/19 and 02/25/19 EKGs, no overt concern but some decreased QRS amplitude Did refer to pulmonology for further evaluation and lung function testing  Began steroid inhaler to mitigate possible inflammation in airway, could be some radiation related scarring and emphysema  PLAN: -Discussed pt labwork today, 06/12/2019; Potassium a  little high at 5.3, Magnesium still low at 1.6, other blood counts and chemistries continue to improve. -Will stop PO Potassium replacement -Continue PO Magnesium replacement  -Will repeat PET/CT prior to next week, which will be at least 12 weeks after concluding concurrent chemo/RT -Pt has been improving her nutritional optimization, increased her hydration status, and has becoming progressively more active -Recommend purchasing good weighing scale at home, and recommend weighing herself at home -Continue 33m Marinol BID -Recommend speaking with home care to determine qualification for meals on wheels -Follow up with nutritional therapist BErnestene Kielfor consultation as well -Continue PT at home -Will continue monthly Sandostatin -Hold Amlodipine as BP has been low and pt has been eating worse -Continue with Xgeva every 4 weeks, no dental concerns at this time -Continue 2000 units Vitamin D every day, with dose adjustment to maintain 25OH Vit D levels of 40-60 -Trazodone for sleep difficulty -Recommended that the pt continue to eat well, drink at least 48-64 oz of water each day, and walk 20-30 minutes each day.  -Will see the pt back in 4 weeks   PET/CT in 3 weeks RTC with Dr KIrene Limboin 4 weeks -continue Xgeva and Sandostatin q4weeks-plz schedule next 4 doses   All questions were answered. The patient knows to call the clinic with any problems, questions or concerns.  The total time spent in the appt was 25 minutes and more than 50% was on counseling and  direct patient cares.   Sullivan Lone MD [place] Hematology/Oncology Physician [facility]  (Office): [phone] (Work cell): [phone] (Fax): [phone]  I, Baldwin [place], am acting as a scribe for Dr. Sullivan Lone.   .I have reviewed the above documentation for accuracy and completeness, and I agree with the above. [name] MD

## 2019-06-12 ENCOUNTER — Other Ambulatory Visit: Payer: Self-pay

## 2019-06-12 ENCOUNTER — Inpatient Hospital Stay (HOSPITAL_BASED_OUTPATIENT_CLINIC_OR_DEPARTMENT_OTHER): Payer: Medicare Other | Admitting: Hematology

## 2019-06-12 ENCOUNTER — Inpatient Hospital Stay: Payer: Medicare Other | Attending: Hematology

## 2019-06-12 ENCOUNTER — Telehealth: Payer: Self-pay | Admitting: Hematology

## 2019-06-12 VITALS — BP 143/73 | HR 96 | Temp 98.3°F | Resp 18 | Ht 68.5 in | Wt 132.4 lb

## 2019-06-12 DIAGNOSIS — Z87891 Personal history of nicotine dependence: Secondary | ICD-10-CM

## 2019-06-12 DIAGNOSIS — Z7901 Long term (current) use of anticoagulants: Secondary | ICD-10-CM

## 2019-06-12 DIAGNOSIS — I82401 Acute embolism and thrombosis of unspecified deep veins of right lower extremity: Secondary | ICD-10-CM

## 2019-06-12 DIAGNOSIS — I2699 Other pulmonary embolism without acute cor pulmonale: Secondary | ICD-10-CM

## 2019-06-12 DIAGNOSIS — C155 Malignant neoplasm of lower third of esophagus: Secondary | ICD-10-CM | POA: Diagnosis not present

## 2019-06-12 DIAGNOSIS — C7951 Secondary malignant neoplasm of bone: Secondary | ICD-10-CM

## 2019-06-12 DIAGNOSIS — C774 Secondary and unspecified malignant neoplasm of inguinal and lower limb lymph nodes: Secondary | ICD-10-CM

## 2019-06-12 DIAGNOSIS — E559 Vitamin D deficiency, unspecified: Secondary | ICD-10-CM

## 2019-06-12 DIAGNOSIS — C3491 Malignant neoplasm of unspecified part of right bronchus or lung: Secondary | ICD-10-CM | POA: Insufficient documentation

## 2019-06-12 LAB — CBC WITH DIFFERENTIAL/PLATELET
Abs Immature Granulocytes: 0.01 10*3/uL (ref 0.00–0.07)
Basophils Absolute: 0.1 10*3/uL (ref 0.0–0.1)
Basophils Relative: 1 %
Eosinophils Absolute: 0.1 10*3/uL (ref 0.0–0.5)
Eosinophils Relative: 2 %
HCT: 36.4 % (ref 36.0–46.0)
Hemoglobin: 11.3 g/dL — ABNORMAL LOW (ref 12.0–15.0)
Immature Granulocytes: 0 %
Lymphocytes Relative: 23 %
Lymphs Abs: 1.3 10*3/uL (ref 0.7–4.0)
MCH: 28.5 pg (ref 26.0–34.0)
MCHC: 31 g/dL (ref 30.0–36.0)
MCV: 91.9 fL (ref 80.0–100.0)
Monocytes Absolute: 0.6 10*3/uL (ref 0.1–1.0)
Monocytes Relative: 11 %
Neutro Abs: 3.5 10*3/uL (ref 1.7–7.7)
Neutrophils Relative %: 63 %
Platelets: 183 10*3/uL (ref 150–400)
RBC: 3.96 MIL/uL (ref 3.87–5.11)
RDW: 14.3 % (ref 11.5–15.5)
WBC: 5.5 10*3/uL (ref 4.0–10.5)
nRBC: 0 % (ref 0.0–0.2)

## 2019-06-12 LAB — CMP (CANCER CENTER ONLY)
ALT: 11 U/L (ref 0–44)
AST: 31 U/L (ref 15–41)
Albumin: 3 g/dL — ABNORMAL LOW (ref 3.5–5.0)
Alkaline Phosphatase: 76 U/L (ref 38–126)
Anion gap: 5 (ref 5–15)
BUN: 15 mg/dL (ref 8–23)
CO2: 26 mmol/L (ref 22–32)
Calcium: 9.8 mg/dL (ref 8.9–10.3)
Chloride: 108 mmol/L (ref 98–111)
Creatinine: 1.01 mg/dL — ABNORMAL HIGH (ref 0.44–1.00)
GFR, Est AFR Am: >60 mL/min (ref >60)
GFR, Estimated: 53 mL/min — ABNORMAL LOW (ref >60)
Glucose, Bld: 95 mg/dL (ref 70–99)
Potassium: 5.3 mmol/L — ABNORMAL HIGH (ref 3.5–5.1)
Sodium: 139 mmol/L (ref 135–145)
Total Bilirubin: 0.2 mg/dL — ABNORMAL LOW (ref 0.3–1.2)
Total Protein: 7.4 g/dL (ref 6.5–8.1)

## 2019-06-12 LAB — MAGNESIUM: Magnesium: 1.6 mg/dL — ABNORMAL LOW (ref 1.7–2.4)

## 2019-06-12 NOTE — Telephone Encounter (Signed)
Scheduled appt per 7/8 los. Spoke with patient and she is aware of her appt date and time.

## 2019-06-18 ENCOUNTER — Other Ambulatory Visit: Payer: Self-pay | Admitting: Hematology

## 2019-06-26 ENCOUNTER — Inpatient Hospital Stay: Payer: Medicare Other

## 2019-06-26 ENCOUNTER — Other Ambulatory Visit: Payer: Self-pay

## 2019-06-26 ENCOUNTER — Other Ambulatory Visit: Payer: Self-pay | Admitting: Hematology

## 2019-06-26 VITALS — BP 124/72 | Temp 97.7°F | Resp 18

## 2019-06-26 DIAGNOSIS — I82401 Acute embolism and thrombosis of unspecified deep veins of right lower extremity: Secondary | ICD-10-CM | POA: Diagnosis not present

## 2019-06-26 DIAGNOSIS — C774 Secondary and unspecified malignant neoplasm of inguinal and lower limb lymph nodes: Secondary | ICD-10-CM | POA: Diagnosis not present

## 2019-06-26 DIAGNOSIS — Z95828 Presence of other vascular implants and grafts: Secondary | ICD-10-CM

## 2019-06-26 DIAGNOSIS — C3491 Malignant neoplasm of unspecified part of right bronchus or lung: Secondary | ICD-10-CM | POA: Diagnosis not present

## 2019-06-26 DIAGNOSIS — C155 Malignant neoplasm of lower third of esophagus: Secondary | ICD-10-CM

## 2019-06-26 DIAGNOSIS — Z7189 Other specified counseling: Secondary | ICD-10-CM

## 2019-06-26 DIAGNOSIS — K521 Toxic gastroenteritis and colitis: Secondary | ICD-10-CM

## 2019-06-26 DIAGNOSIS — K529 Noninfective gastroenteritis and colitis, unspecified: Secondary | ICD-10-CM

## 2019-06-26 DIAGNOSIS — C7951 Secondary malignant neoplasm of bone: Secondary | ICD-10-CM | POA: Diagnosis not present

## 2019-06-26 DIAGNOSIS — C3490 Malignant neoplasm of unspecified part of unspecified bronchus or lung: Secondary | ICD-10-CM

## 2019-06-26 DIAGNOSIS — R197 Diarrhea, unspecified: Secondary | ICD-10-CM

## 2019-06-26 DIAGNOSIS — Z87891 Personal history of nicotine dependence: Secondary | ICD-10-CM | POA: Diagnosis not present

## 2019-06-26 DIAGNOSIS — I2699 Other pulmonary embolism without acute cor pulmonale: Secondary | ICD-10-CM | POA: Diagnosis not present

## 2019-06-26 MED ORDER — DENOSUMAB 120 MG/1.7ML ~~LOC~~ SOLN
120.0000 mg | Freq: Once | SUBCUTANEOUS | Status: AC
  Administered 2019-06-26: 120 mg via SUBCUTANEOUS

## 2019-06-26 MED ORDER — OCTREOTIDE ACETATE 30 MG IM KIT
30.0000 mg | PACK | Freq: Once | INTRAMUSCULAR | Status: AC
  Administered 2019-06-26: 30 mg via INTRAMUSCULAR

## 2019-06-26 MED ORDER — OCTREOTIDE ACETATE 30 MG IM KIT
PACK | INTRAMUSCULAR | Status: AC
  Filled 2019-06-26: qty 1

## 2019-06-26 MED ORDER — DENOSUMAB 120 MG/1.7ML ~~LOC~~ SOLN
SUBCUTANEOUS | Status: AC
  Filled 2019-06-26: qty 1.7

## 2019-06-26 NOTE — Patient Instructions (Signed)
Denosumab injection What is this medicine? DENOSUMAB (den oh sue mab) slows bone breakdown. Prolia is used to treat osteoporosis in women after menopause and in men, and in people who are taking corticosteroids for 6 months or more. Xgeva is used to treat a high calcium level due to cancer and to prevent bone fractures and other bone problems caused by multiple myeloma or cancer bone metastases. Xgeva is also used to treat giant cell tumor of the bone. This medicine may be used for other purposes; ask your health care provider or pharmacist if you have questions. COMMON BRAND NAME(S): Prolia, XGEVA What should I tell my health care provider before I take this medicine? They need to know if you have any of these conditions:  dental disease  having surgery or tooth extraction  infection  kidney disease  low levels of calcium or Vitamin D in the blood  malnutrition  on hemodialysis  skin conditions or sensitivity  thyroid or parathyroid disease  an unusual reaction to denosumab, other medicines, foods, dyes, or preservatives  pregnant or trying to get pregnant  breast-feeding How should I use this medicine? This medicine is for injection under the skin. It is given by a health care professional in a hospital or clinic setting. A special MedGuide will be given to you before each treatment. Be sure to read this information carefully each time. For Prolia, talk to your pediatrician regarding the use of this medicine in children. Special care may be needed. For Xgeva, talk to your pediatrician regarding the use of this medicine in children. While this drug may be prescribed for children as young as 13 years for selected conditions, precautions do apply. Overdosage: If you think you have taken too much of this medicine contact a poison control center or emergency room at once. NOTE: This medicine is only for you. Do not share this medicine with others. What if I miss a dose? It is  important not to miss your dose. Call your doctor or health care professional if you are unable to keep an appointment. What may interact with this medicine? Do not take this medicine with any of the following medications:  other medicines containing denosumab This medicine may also interact with the following medications:  medicines that lower your chance of fighting infection  steroid medicines like prednisone or cortisone This list may not describe all possible interactions. Give your health care provider a list of all the medicines, herbs, non-prescription drugs, or dietary supplements you use. Also tell them if you smoke, drink alcohol, or use illegal drugs. Some items may interact with your medicine. What should I watch for while using this medicine? Visit your doctor or health care professional for regular checks on your progress. Your doctor or health care professional may order blood tests and other tests to see how you are doing. Call your doctor or health care professional for advice if you get a fever, chills or sore throat, or other symptoms of a cold or flu. Do not treat yourself. This drug may decrease your body's ability to fight infection. Try to avoid being around people who are sick. You should make sure you get enough calcium and vitamin D while you are taking this medicine, unless your doctor tells you not to. Discuss the foods you eat and the vitamins you take with your health care professional. See your dentist regularly. Brush and floss your teeth as directed. Before you have any dental work done, tell your dentist you are   receiving this medicine. Do not become pregnant while taking this medicine or for 5 months after stopping it. Talk with your doctor or health care professional about your birth control options while taking this medicine. Women should inform their doctor if they wish to become pregnant or think they might be pregnant. There is a potential for serious side  effects to an unborn child. Talk to your health care professional or pharmacist for more information. What side effects may I notice from receiving this medicine? Side effects that you should report to your doctor or health care professional as soon as possible:  allergic reactions like skin rash, itching or hives, swelling of the face, lips, or tongue  bone pain  breathing problems  dizziness  jaw pain, especially after dental work  redness, blistering, peeling of the skin  signs and symptoms of infection like fever or chills; cough; sore throat; pain or trouble passing urine  signs of low calcium like fast heartbeat, muscle cramps or muscle pain; pain, tingling, numbness in the hands or feet; seizures  unusual bleeding or bruising  unusually weak or tired Side effects that usually do not require medical attention (report to your doctor or health care professional if they continue or are bothersome):  constipation  diarrhea  headache  joint pain  loss of appetite  muscle pain  runny nose  tiredness  upset stomach This list may not describe all possible side effects. Call your doctor for medical advice about side effects. You may report side effects to FDA at 1-800-FDA-1088. Where should I keep my medicine? This medicine is only given in a clinic, doctor's office, or other health care setting and will not be stored at home. NOTE: This sheet is a summary. It may not cover all possible information. If you have questions about this medicine, talk to your doctor, pharmacist, or health care provider.  2020 [employer] Standard (2018-03-30 16:10:44) Octreotide injection solution What is this medicine? OCTREOTIDE (ok TREE oh tide) is used to reduce blood levels of growth hormone in patients with a condition called acromegaly. This medicine also reduces flushing and watery diarrhea caused by certain types of cancer. This medicine may be used for other purposes; ask your  health care provider or pharmacist if you have questions. COMMON BRAND NAME(S): Bynfezia, Sandostatin, Sandostatin LAR What should I tell my health care provider before I take this medicine? They need to know if you have any of these conditions:  diabetes  gallbladder disease  kidney disease  liver disease  an unusual or allergic reaction to octreotide, other medicines, foods, dyes, or preservatives  pregnant or trying to get pregnant  breast-feeding How should I use this medicine? This medicine is for injection under the skin or into a vein (only in emergency situations). It is usually given by a health care professional in a hospital or clinic setting. If you get this medicine at home, you will be taught how to prepare and give this medicine. Allow the injection solution to come to room temperature before use. Do not warm it artificially. Use exactly as directed. Take your medicine at regular intervals. Do not take your medicine more often than directed. It is important that you put your used needles and syringes in a special sharps container. Do not put them in a trash can. If you do not have a sharps container, call your pharmacist or healthcare provider to get one. Talk to your pediatrician regarding the use of this medicine in children. Special care  may be needed. Overdosage: If you think you have taken too much of this medicine contact a poison control center or emergency room at once. NOTE: This medicine is only for you. Do not share this medicine with others. What if I miss a dose? If you miss a dose, take it as soon as you can. If it is almost time for your next dose, take only that dose. Do not take double or extra doses. What may interact with this medicine? Do not take this medicine with any of the following medications:  cisapride  droperidol  general anesthetics  grepafloxacin  perphenazine  thioridazine This medicine may also interact with the following  medications:  bromocriptine  cyclosporine  diuretics  medicines for blood pressure, heart disease, irregular heart beat  medicines for diabetes, including insulin  quinidine This list may not describe all possible interactions. Give your health care provider a list of all the medicines, herbs, non-prescription drugs, or dietary supplements you use. Also tell them if you smoke, drink alcohol, or use illegal drugs. Some items may interact with your medicine. What should I watch for while using this medicine? Visit your doctor or health care professional for regular checks on your progress. To help reduce irritation at the injection site, use a different site for each injection and make sure the solution is at room temperature before use. This medicine may cause decreases in blood sugar. Signs of low blood sugar include chills, cool, pale skin or cold sweats, drowsiness, extreme hunger, fast heartbeat, headache, nausea, nervousness or anxiety, shakiness, trembling, unsteadiness, tiredness, or weakness. Contact your doctor or health care professional right away if you experience any of these symptoms. This medicine may increase blood sugar. Ask your healthcare provider if changes in diet or medicines are needed if you have diabetes. This medicine may cause a decrease in vitamin B12. You should make sure that you get enough vitamin B12 while you are taking this medicine. Discuss the foods you eat and the vitamins you take with your health care professional. What side effects may I notice from receiving this medicine? Side effects that you should report to your doctor or health care professional as soon as possible:  allergic reactions like skin rash, itching or hives, swelling of the face, lips, or tongue  decreases in blood sugar  changes in heart rate  severe stomach pain   signs and symptoms of high blood sugar such as being more thirsty or hungry or having to urinate more than normal.  You may also feel very tired or have blurry vision. Side effects that usually do not require medical attention (report to your doctor or health care professional if they continue or are bothersome):  diarrhea or constipation  gas or stomach pain  nausea, vomiting  pain, redness, swelling and irritation at site where injected This list may not describe all possible side effects. Call your doctor for medical advice about side effects. You may report side effects to FDA at 1-800-FDA-1088. Where should I keep my medicine? Keep out of the reach of children. Store in a refrigerator between 2 and 8 degrees C (36 and 46 degrees F). Protect from light. Allow to come to room temperature naturally. Do not use artificial heat. If protected from light, the injection may be stored at room temperature between 20 and 30 degrees C (70 and 86 degrees F) for 14 days. After the initial use, throw away any unused portion of a multiple dose vial after 14 days. Throw  away unused portions of the ampules after use. NOTE: This sheet is a summary. It may not cover all possible information. If you have questions about this medicine, talk to your doctor, pharmacist, or health care provider.  2020 [employer] Standard (2018-08-30 08:07:09)

## 2019-06-26 NOTE — Progress Notes (Signed)
Per Dr. Irene Limbo ok to use 7/8 labs calcium 9.8

## 2019-07-03 ENCOUNTER — Encounter (HOSPITAL_COMMUNITY)
Admission: RE | Admit: 2019-07-03 | Discharge: 2019-07-03 | Disposition: A | Discharge status: Home / Self Care | Payer: Medicare Other | Source: Ambulatory Visit | Attending: Hematology | Admitting: Hematology

## 2019-07-03 ENCOUNTER — Other Ambulatory Visit: Payer: Self-pay

## 2019-07-03 DIAGNOSIS — C7951 Secondary malignant neoplasm of bone: Secondary | ICD-10-CM | POA: Diagnosis not present

## 2019-07-03 DIAGNOSIS — C3491 Malignant neoplasm of unspecified part of right bronchus or lung: Secondary | ICD-10-CM | POA: Diagnosis not present

## 2019-07-03 DIAGNOSIS — I700 Atherosclerosis of aorta: Secondary | ICD-10-CM | POA: Diagnosis not present

## 2019-07-03 DIAGNOSIS — J439 Emphysema, unspecified: Secondary | ICD-10-CM | POA: Insufficient documentation

## 2019-07-03 DIAGNOSIS — I2510 Atherosclerotic heart disease of native coronary artery without angina pectoris: Secondary | ICD-10-CM | POA: Insufficient documentation

## 2019-07-03 DIAGNOSIS — C3490 Malignant neoplasm of unspecified part of unspecified bronchus or lung: Secondary | ICD-10-CM | POA: Diagnosis not present

## 2019-07-03 DIAGNOSIS — C155 Malignant neoplasm of lower third of esophagus: Secondary | ICD-10-CM | POA: Insufficient documentation

## 2019-07-03 LAB — GLUCOSE, CAPILLARY: Glucose-Capillary: 107 mg/dL — ABNORMAL HIGH (ref 70–99)

## 2019-07-03 MED ORDER — FLUDEOXYGLUCOSE F - 18 (FDG) INJECTION
6.5800 | Freq: Once | INTRAVENOUS | Status: AC | PRN
  Administered 2019-07-03: 6.58 via INTRAVENOUS

## 2019-07-09 NOTE — Progress Notes (Signed)
HEMATOLOGY ONCOLOGY PROGRESS NOTE  Date of service:  07/10/19    Patient Care Team: [name], NP as PCP - General (Family Medicine) [name], MD as Consulting Physician (Hematology and Oncology)  CHIEF COMPLAINTS/PURPOSE OF CONSULTATION:  F/u for metastatic lung cancer  DIAGNOSIS:   #1 Metastatic non-small cell lung cancer with bilateral lung nodules and large metastatic lesion in the left Ilium. 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 Also had c diff colitis - treated   Current Treatment  1) Active surveillance 2) Xgeva 141m [place] q4weeks for bone metastases. 3) Sandostatin q4weeks for diarrhea - immune colitis  Previous Treatment  1 Palliative radiation therapy to the large left ilium metastases 2. IV Nivolumab x 20ycles (discontinued due to likely immune colitis) 3. Xgeva 1255mSC q4weeks for bone metastases.   HISTORY OF PRESENTING ILLNESS: (plz see my previous consultation for details of initial presentation)  INTERVAL HISTORY:  Ms ToHayess presenting today for her scheduled follow-up for metastatic lung cancer, and adenocarcinoma of the esophagus. The patient's last visit with [place] on 06/12/2019. The pt reports that she is doing well overall.  The pt reports that she experiencing some burning in her throat when she swallows. She is eating almost as much as she did before treatment but says that some foods do not taste the same.  She occasionally coughs up thick mucus and a little bit of blood. It comes on randomly during the day. Denies fevers, chills, and breathing problems. She reports that she does not drink a lot of water.  Of note since the patient's last visit, pt has had PET skull base to thigh completed on 07/03/2019 with results revealing "1. Interval response to therapy. Significant reduction in FDG uptake associated with distal esophageal mass. SUV max currently 2.61 versus  16.97 previously. 2. 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 Unchanged 3. Aortic Atherosclerosis (ICD10-I70.0) and Emphysema (ICD10-J43.9). Coronary artery calcifications."  Most recent lab results from 06/12/2019 of CBC w/diff is as follows: all values are WNL except for HGB at 11.3  On review of systems, pt reports coughing, burning in throat while swallowing and denies fevers, chills, difficulty breathing, weight changes, and any other symptoms.   MEDICAL HISTORY:  Past Medical History:  Diagnosis Date  . Barrett's esophagus   . Bone neoplasm 06/24/2015  . [facility]   metastatic poorly differentiated carcinoma. tumor left groin surgical removal with radiation tx.  . Cataract    BILATERAL  . Cigarette smoker two packs a day or less    Currently still smoking 2 PPD - Not interested in quitting at this time.  . Colitis 2017  . Colon polyps    hyperplastic, tubular adenomas, tubulovillous adenoma  . Cough, persistent    hx. lung cancer ? primary-being evaluated, unsure of primary site.  . Depression 06/24/2015  . Diverticulosis   . [facility]  . Endometriosis    Hysterectomy with BSO at age [age]rs  . Esophageal adenocarcinoma [facility]   intramucosal  . Gastritis   . GERD (gastroesophageal reflux disease)   . H/O: pneumonia   . Hiatal hernia   . Hyperlipidemia   . Hypertension 06/24/2015   likely improved incidental to 40 lbs weight loss from her neoplasm. No Longer taking med for this as of 08-06-15  . IBS (irritable bowel syndrome)   . Pain  left hip-persistent"tumor of bone"-radiation tx. 10.  . Vitamin D deficiency disease    SURGICAL HISTORY: Past Surgical History:  Procedure Laterality Date  . BARTHOLIN GLAND CYST EXCISION  78 yo ago   Does not want if it was an infected cyst or tumor. Was soon as delivery  . BIOPSY  01/02/2019   Procedure: BIOPSY;  Surgeon: [name], MD;  Location: [facility];  Service:  Gastroenterology;;  . CATARACT EXTRACTION    . COLONOSCOPY W/ POLYPECTOMY     multiple times - last done 09/2014 per patient.  . ESOPHAGOGASTRODUODENOSCOPY (EGD) WITH PROPOFOL N/A 08/11/2015   Procedure: ESOPHAGOGASTRODUODENOSCOPY (EGD) WITH PROPOFOL;  Surgeon: [name], MD;  Location: [facility];  Service: Gastroenterology;  Laterality: N/A;  . ESOPHAGOGASTRODUODENOSCOPY (EGD) WITH PROPOFOL N/A 01/02/2019   Procedure: ESOPHAGOGASTRODUODENOSCOPY (EGD) WITH PROPOFOL;  Surgeon: [name], MD;  Location: [facility];  Service: Gastroenterology;  Laterality: N/A;  . FLEXIBLE SIGMOIDOSCOPY N/A 06/24/2017   Procedure: FLEXIBLE SIGMOIDOSCOPY;  Surgeon: Manus Gunning, MD;  Location: [facility];  Service: Gastroenterology;  Laterality: N/A;  . GANGLION CYST EXCISION    . KNEE ARTHROSCOPY  age about 2 yrs  . TONSILLECTOMY    . TOTAL ABDOMINAL HYSTERECTOMY W/ BILATERAL SALPINGOOPHORECTOMY  at age 14 yrs   For endometriosis    SOCIAL HISTORY: Social History   Socioeconomic History  . Marital status: Widowed    Spouse name: Not on file  . Number of children: 2  . Years of education: Not on file  . Highest education level: Not on file  Occupational History  . Not on file  Social Needs  . Financial resource strain: Not on file  . Food insecurity    Worry: Not on file    Inability: Not on file  . Transportation needs    Medical: No    Non-medical: No  Tobacco Use  . Smoking status: Former Smoker    Packs/day: 1.00    Years: 60.00    Pack years: 60.00    Types: Cigarettes    Quit date: 12/05/2014    Years since quitting: 4.5  . Smokeless tobacco: Never Used  Substance and Sexual Activity  . Alcohol use: No    Alcohol/week: 0.0 standard drinks  . Drug use: No  . Sexual activity: Never  Lifestyle  . Physical activity    Days per week: Not on file    Minutes per session: Not on file  . Stress: Not on file  Relationships  . Social Herbalist on phone:  Not on file    Gets together: Not on file    Attends religious service: Not on file    Active member of club or organization: Not on file    Attends meetings of clubs or organizations: Not on file    Relationship status: Not on file  . Intimate partner violence    Fear of current or ex partner: Not on file    Emotionally abused: Not on file    Physically abused: Not on file    Forced sexual activity: Not on file  Other Topics Concern  . Not on file  Social History Narrative  . Not on file    FAMILY HISTORY: Family History  Problem Relation Age of Onset  . Colon cancer Brother   . Colon cancer Brother   . Stroke Mother   . Colon cancer Father   . Emphysema Father        smoked  .  Breast cancer Daughter 47       ER/PR+ stage II    ALLERGIES:  is allergic to penicillins; remeron [mirtazapine]; and latex. patient wonders if she has a penicillin allergy but notes that she is uncertain about this.  MEDICATIONS:  Current Outpatient Medications  Medication Sig Dispense Refill  . amitriptyline (ELAVIL) 25 MG tablet Take 25 mg by mouth at bedtime.     . Cholecalciferol (VITAMIN D3) 25 MCG (1000 UT) CAPS Take 1,000 Units by mouth daily.    [name] dexamethasone (DECADRON) 2 MG tablet Take 1 tablet (2 mg total) by mouth daily with breakfast. 30 tablet 0  . FLUoxetine (PROZAC) 20 MG capsule Take 1 capsule (20 mg total) by mouth daily. 90 capsule 3  . KLOR-CON M20 20 MEQ tablet TAKE 1 TABLET BY MOUTH TWICE A DAY (Patient taking differently: Take 20 mEq by mouth daily. ) 60 tablet 1  . lidocaine-prilocaine (EMLA) cream Apply to affected area once (Patient taking differently: Apply 1 application topically as needed (port access). Apply to affected area once) 30 g 3  . magnesium oxide (MAG-OX) 400 (241.3 Mg) MG tablet Take 1 tablet (400 mg total) by mouth daily. 60 tablet 1  . omeprazole (PRILOSEC) 40 MG capsule TAKE 1 CAPSULE BY MOUTH EVERY DAY 90 capsule 0  . Probiotic Product (PROBIOTIC PO)  Take 1 capsule by mouth 2 (two) times daily.    . rivaroxaban (XARELTO) 20 MG TABS tablet Take 1 tablet (20 mg total) by mouth daily with supper. 30 tablet 2  . sucralfate (CARAFATE) 1 g tablet Take 1 tablet (1 g total) by mouth 4 (four) times daily. (Patient taking differently: Take 1 g by mouth daily. ) 120 tablet 2  . traZODone (DESYREL) 50 MG tablet TAKE 1 TABLET BY MOUTH EVERYDAY AT BEDTIME (Patient taking differently: Take 50 mg by mouth at bedtime. ) 90 tablet 2  . Vitamin D, Ergocalciferol, (DRISDOL) 1.25 MG (50000 UT) CAPS capsule TAKE 1 CAPSULE BY MOUTH EVERY 7 DAYS 12 capsule 3  . VITAMIN E PO Take 1 tablet by mouth daily.     No current facility-administered medications for this visit.     REVIEW OF SYSTEMS:    A 10+ POINT REVIEW OF SYSTEMS WAS OBTAINED including neurology, dermatology, psychiatry, cardiac, respiratory, lymph, extremities, GI, GU, Musculoskeletal, constitutional, breasts, reproductive, HEENT.  All pertinent positives are noted in the HPI.  All others are negative.    PHYSICAL EXAMINATION: ECOG PERFORMANCE STATUS: 2 - Symptomatic, <50% confined to bed  Vitals:   07/10/19 1531  BP: 127/65  Pulse: 74  Resp: 18  Temp: 98.9 F (37.2 C)  SpO2: 98%   Filed Weights   07/10/19 1531  Weight: 132 lb 12.8 oz (60.2 kg)  .  Wt Readings from Last 3 Encounters:  07/10/19 132 lb 12.8 oz (60.2 kg)  06/12/19 132 lb 6.4 oz (60.1 kg)  05/15/19 129 lb 12.8 oz (58.9 kg)   GENERAL:alert, in no acute distress and comfortable SKIN: no acute rashes, no significant lesions EYES: conjunctiva are pink and non-injected, sclera anicteric OROPHARYNX: MMM, no exudates, no oropharyngeal erythema or ulceration NECK: supple, no JVD LYMPH:  no palpable lymphadenopathy in the cervical, axillary or inguinal regions LUNGS: clear to auscultation b/l with normal respiratory effort HEART: regular rate & rhythm ABDOMEN:  normoactive bowel sounds , non tender, not distended. No palpable  hepatosplenomegaly.  Extremity: no pedal edema PSYCH: alert & oriented x 3 with fluent speech NEURO: no focal motor/sensory  deficits   LABORATORY DATA:  I have reviewed the data as listed  . CBC Latest Ref Rng & Units 06/12/2019 05/29/2019 05/15/2019  WBC 4.0 - 10.5 K/uL 5.5 5.2 6.2  Hemoglobin 12.0 - 15.0 g/dL 11.3(L) 11.0(L) 11.1(L)  Hematocrit 36.0 - 46.0 % 36.4 35.6(L) 36.4  Platelets 150 - 400 K/uL 183 190 210   ANC 1.8k . CMP Latest Ref Rng & Units 06/12/2019 05/29/2019 05/15/2019  Glucose 70 - 99 mg/dL 95 148(H) 113(H)  BUN 8 - 23 mg/dL 15 17 14   Creatinine 0.44 - 1.00 mg/dL 1.01(H) 1.12(H) 1.09(H)  Sodium 135 - 145 mmol/L 139 141 140  Potassium 3.5 - 5.1 mmol/L 5.3(H) 4.9 5.0  Chloride 98 - 111 mmol/L 108 110 111  CO2 22 - 32 mmol/L 26 25 22   Calcium 8.9 - 10.3 mg/dL 9.8 8.7(L) 8.6(L)  Total Protein 6.5 - 8.1 g/dL 7.4 7.1 7.4  Total Bilirubin 0.3 - 1.2 mg/dL 0.2(L) <0.2(L) 0.3  Alkaline Phos 38 - 126 U/L 76 83 100  AST 15 - 41 U/L 31 28 29   ALT 0 - 44 U/L 11 11 10        01/02/19 Esophagus Biopsy:    RADIOGRAPHIC STUDIES:  .Nm Pet Image Restag (ps) Skull Base To Thigh  Result Date: 07/03/2019 CLINICAL DATA:  Subsequent treatment strategy for metastatic lung cancer and malignant neoplasm of the distal third of the esophagus. EXAM: NUCLEAR MEDICINE PET SKULL BASE TO THIGH TECHNIQUE: 6.58 mCi F-18 FDG was injected intravenously. Full-ring PET imaging was performed from the skull base to thigh after the radiotracer. CT data was obtained and used for attenuation correction and anatomic localization. Fasting blood glucose: 107 mg/dl COMPARISON:  01/25/2019 FINDINGS: Mediastinal blood pool activity: SUV max 2.29 Liver activity: SUV max NA NECK: No hypermetabolic lymph nodes in the neck. Incidental CT findings: none CHEST: Distal esophageal wall thickening is again noted. There has been interval decrease in corresponding FDG uptake with an SUV max of 2.61. The previously 16.97. No  FDG avid supraclavicular or axillary lymph nodes. No hypermetabolic mediastinal or hilar lymph nodes. No suspicious FDG avid pulmonary nodules Incidental CT findings: Emphysema. Aortic atherosclerosis. Left main coronary artery calcification noted. ABDOMEN/PELVIS: No abnormal hypermetabolic activity within the liver, pancreas, adrenal glands, or spleen. No hypermetabolic lymph nodes in the abdomen or pelvis. Incidental CT findings: Right lobe of liver cyst measures 11 mm. Stones identified within the gallbladder. Aortic atherosclerosis. No aneurysm. SKELETON: Left iliac bone mixed lytic and sclerotic lesion with chronic pathologic fracture is again noted. Mild low level FDG uptake is similar with SUV max of 2.89. Previously 3.1. No new sites of FDG avid bone metastases identified. Incidental CT findings: none IMPRESSION: 1. Interval response to therapy. Significant reduction in FDG uptake associated with distal esophageal mass. SUV max currently 2.61 versus 16.97 previously. 2. 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 Unchanged 3. Aortic Atherosclerosis (ICD10-I70.0) and Emphysema (ICD10-J43.9). Coronary artery calcifications. Electronically Signed   By: Kerby Moors M.D.   On: 07/03/2019 13:56    ASSESSMENT & PLAN:   78 y.o. female with  #1 Metastatic poorly differentiated carcinoma with likely lung primary non-small cell lung cancer.   CT of the head with and without contrast showed no evidence of metastatic disease. EGFR blood test mutation analysis negative. CT chest abdomen pelvis 04/19/2016 shows no evidence of disease progression. Patient tolerated Nivolumab very well but was discontinued when she developed grade 2 Immune colitis. Has been off Nivolumab for >  6 months  CT chest abdomen pelvis on 06/24/2016 shows no evidence of new disease or progression of metastatic disease. CT chest abdomen pelvis 09/06/2016 shows 1. Mixed interval response to therapy. 2. There is a new  left ventral chest wall lesion deep to the pectoralis musculature worrisome for metastatic disease. 3. Posterior lower lobe nodular densities are identified which may reflect areas of pulmonary metastasis. 4. Interval decrease in size of destructive lesion involving the left iliac bone.  CT chest abd pelvis 12/08/2016: Cystic mass involving the left ventral chest wall has resolved in the interval. Likely was a hematoma due to trauma. Interval increase in size of pleural base mass overlying the posterior and inferior left lower lobe. There is also a new left pleural effusion identified.  CT chest 02/01/2017: Residual irregular soft tissue thickening/volume loss and trace left pleural fluid at the base of the left hemithorax, overall improved in appearance from 12/08/2016. No measurable lesion.  CT chest 05/29/2017 shows no residual pleural based mass or significant pleural effusion in the left hemithorax. No evidence of thoracic metastatic disease. No evidence of progressive metastatic disease within the abdomen or pelvis. Mixed lytic and blastic lesion involving the left iliac bone and associated pathologic fracture are unchanged.   CT CAP 09/14/17 shows no new changes. She does have slight displacement of her fractured left iliac bone. Evidence of stable disease.   CT CAP 01/04/2018- No new or progressive metastatic disease. Stable large left iliac bone metastasis with associated chronic pathologic fracture.   CT chest/abd/pelvis done on 04/26/18 revealed Stable exam.  No new or progressive interval findings.  07/19/18 CT C/A/P revealed Stable exam.  No new or progressive interval findings. Large destructive left iliac lesion is similar to prior. Aortic Atherosclerosis and Emphysema.    11/06/18 CT C/A/P revealed Similar appearance of large mixed lytic and sclerotic lesion in the left ilium. No new metastatic lesions are otherwise noted elsewhere in the chest, abdomen or pelvis. 2. Interval development of  thickening of the distal third of the esophagus. This is nonspecific, and could be related to underlying reflux esophagitis. However, if there is any clinical concern for Barrett's metaplasia or esophageal neoplasia, further evaluation with nonemergent endoscopy could be considered. 3. Aortic atherosclerosis, in addition to left main coronary artery disease. Assessment for potential risk factor modification, dietary therapy or pharmacologic therapy may be warranted, if clinically Indicated. 4. Diffuse bronchial wall thickening with mild to moderate centrilobular and paraseptal emphysema; imaging findings suggestive of underlying COPD. 5. Additional incidental findings, as above.   #2 Newly diagnosed Adenocarcinoma of the esophagus  Barrett's esophagus 4cms in the distal esophagus with low and high-grade dysplasia  01/02/19 Surgical pathology revealed adenocarcinoma of the esophagus   01/25/19 PET/CT revealed Distal esophageal primary, without hypermetabolic metastatic disease. 2. Chronic left iliac metastasis, as before. 3. Hypermetabolism within and superficial to the right gluteal musculature is most likely related to trauma and/or injection sites. 4. Aortic atherosclerosis, coronary artery atherosclerosis and emphysema.  S/p concurrent Carboplatin and Taxol weekly with RT of 45 Gy in 25 fractions and 5.4 Gy boost, completed between 02/04/19 and 03/27/19  #3 diarrhea-  now resolved was previously. 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 She was previously on on Lialda, budesonide,probiotics and lomotil but not currently taking any of these. Plan -Continue Sandostatin every 4 weeks   #4 h/o diverticulitis and c diff  colitis - now resolved Plan  -continue on sandostatin today and q4weeks.  -continue on Lialda  #5 DVT and PE  -continue on Xarelto - no issues with bleeding   #6  Dsypnea 03/14/19 ECHO revealed LV EF of 60-65% 03/06/19 CXR revealed clear lung fields, normal heart size 03/07/19 and 02/25/19 EKGs, no overt concern but some decreased QRS amplitude Did refer to pulmonology for further evaluation and lung function testing  Began steroid inhaler to mitigate possible inflammation in airway, could be some radiation related scarring and emphysema  07/03/2019 PET skull base to thigh revealed "1. Interval response to therapy. Significant reduction in FDG uptake associated with distal esophageal mass. SUV max currently 2.61 versus 16.97 previously. 2. 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 Unchanged 3. Aortic Atherosclerosis (ICD10-I70.0) and Emphysema (ICD10-J43.9). Coronary artery calcifications."  PLAN: -Discussed 07/03/2019 PET skull base to thigh which revealed that she is responding well to treatment, no enlarged LN in the chest, neck, or axilla -Recommend increasing Omeprazole to BID for heartburns -Continue PO Magnesium replacement  -Continue 21m Marinol BID -Will continue monthly Sandostatin -Continue with Xgeva every 4 weeks, no dental concerns at this time -Continue 2000 units Vitamin D every day, with dose adjustment to maintain 25OH Vit D levels of 40-60 -Continue Trazodone for sleep difficulty -Recommend repeating endoscopy with Dr. JZenovia Jarredin the next 2-3 months -Recommended that the pt continue to eat well and drink at least 48-64 oz of water each day -Will see the pt back in 2 months   -RTC with Dr KIrene Limboin 8 weeks with labs -continue Xgeva and Sandostatin q4weeks-plz schedule next 4 doses   All questions were answered. The patient knows to call the clinic with any problems, questions or concerns.  The total time spent in the appt was 25 minutes and more than 50% was on counseling and direct patient cares.  GSullivan LoneMD MS Hematology/Oncology Physician [facility] (Office): [phone](Work  cell): [phone](Fax): [phone] I, EDe Burrs am acting as a scribe for Dr. KIrene Limbo .I have reviewed the above documentation for accuracy and completeness, and I agree with the above. .Brunetta GeneraMD

## 2019-07-10 ENCOUNTER — Other Ambulatory Visit: Payer: Self-pay

## 2019-07-10 ENCOUNTER — Telehealth: Payer: Self-pay | Admitting: Hematology

## 2019-07-10 ENCOUNTER — Inpatient Hospital Stay: Payer: Medicare Other | Attending: Hematology | Admitting: Hematology

## 2019-07-10 VITALS — BP 127/65 | HR 74 | Temp 98.9°F | Resp 18 | Ht 68.5 in | Wt 132.8 lb

## 2019-07-10 DIAGNOSIS — I10 Essential (primary) hypertension: Secondary | ICD-10-CM | POA: Diagnosis not present

## 2019-07-10 DIAGNOSIS — Z800 Family history of malignant neoplasm of digestive organs: Secondary | ICD-10-CM | POA: Insufficient documentation

## 2019-07-10 DIAGNOSIS — F329 Major depressive disorder, single episode, unspecified: Secondary | ICD-10-CM | POA: Diagnosis not present

## 2019-07-10 DIAGNOSIS — C7951 Secondary malignant neoplasm of bone: Secondary | ICD-10-CM | POA: Diagnosis not present

## 2019-07-10 DIAGNOSIS — Z86718 Personal history of other venous thrombosis and embolism: Secondary | ICD-10-CM | POA: Insufficient documentation

## 2019-07-10 DIAGNOSIS — Z7901 Long term (current) use of anticoagulants: Secondary | ICD-10-CM | POA: Insufficient documentation

## 2019-07-10 DIAGNOSIS — Z803 Family history of malignant neoplasm of breast: Secondary | ICD-10-CM | POA: Insufficient documentation

## 2019-07-10 DIAGNOSIS — Z87891 Personal history of nicotine dependence: Secondary | ICD-10-CM | POA: Diagnosis not present

## 2019-07-10 DIAGNOSIS — Z79899 Other long term (current) drug therapy: Secondary | ICD-10-CM | POA: Diagnosis not present

## 2019-07-10 DIAGNOSIS — R197 Diarrhea, unspecified: Secondary | ICD-10-CM | POA: Diagnosis not present

## 2019-07-10 DIAGNOSIS — C3490 Malignant neoplasm of unspecified part of unspecified bronchus or lung: Secondary | ICD-10-CM | POA: Diagnosis not present

## 2019-07-10 DIAGNOSIS — Z86711 Personal history of pulmonary embolism: Secondary | ICD-10-CM | POA: Insufficient documentation

## 2019-07-10 DIAGNOSIS — C155 Malignant neoplasm of lower third of esophagus: Secondary | ICD-10-CM | POA: Insufficient documentation

## 2019-07-10 NOTE — Telephone Encounter (Signed)
Scheduled appt per 8/5 los.  Spoke with patient and she is aware of her appt date and time.  Mailed calendar per patient request.

## 2019-07-24 ENCOUNTER — Telehealth: Payer: Self-pay | Admitting: Hematology

## 2019-07-24 ENCOUNTER — Inpatient Hospital Stay: Payer: Medicare Other

## 2019-07-24 ENCOUNTER — Other Ambulatory Visit: Payer: Self-pay

## 2019-07-24 VITALS — BP 154/61 | HR 63 | Temp 99.1°F | Resp 18

## 2019-07-24 DIAGNOSIS — Z86718 Personal history of other venous thrombosis and embolism: Secondary | ICD-10-CM | POA: Diagnosis not present

## 2019-07-24 DIAGNOSIS — Z800 Family history of malignant neoplasm of digestive organs: Secondary | ICD-10-CM | POA: Diagnosis not present

## 2019-07-24 DIAGNOSIS — C7951 Secondary malignant neoplasm of bone: Secondary | ICD-10-CM | POA: Diagnosis not present

## 2019-07-24 DIAGNOSIS — R197 Diarrhea, unspecified: Secondary | ICD-10-CM | POA: Diagnosis not present

## 2019-07-24 DIAGNOSIS — Z95828 Presence of other vascular implants and grafts: Secondary | ICD-10-CM

## 2019-07-24 DIAGNOSIS — C155 Malignant neoplasm of lower third of esophagus: Secondary | ICD-10-CM

## 2019-07-24 DIAGNOSIS — K521 Toxic gastroenteritis and colitis: Secondary | ICD-10-CM

## 2019-07-24 DIAGNOSIS — Z87891 Personal history of nicotine dependence: Secondary | ICD-10-CM | POA: Diagnosis not present

## 2019-07-24 DIAGNOSIS — K529 Noninfective gastroenteritis and colitis, unspecified: Secondary | ICD-10-CM

## 2019-07-24 DIAGNOSIS — C3490 Malignant neoplasm of unspecified part of unspecified bronchus or lung: Secondary | ICD-10-CM | POA: Diagnosis not present

## 2019-07-24 DIAGNOSIS — Z803 Family history of malignant neoplasm of breast: Secondary | ICD-10-CM | POA: Diagnosis not present

## 2019-07-24 DIAGNOSIS — Z7901 Long term (current) use of anticoagulants: Secondary | ICD-10-CM | POA: Diagnosis not present

## 2019-07-24 DIAGNOSIS — I10 Essential (primary) hypertension: Secondary | ICD-10-CM | POA: Diagnosis not present

## 2019-07-24 DIAGNOSIS — Z86711 Personal history of pulmonary embolism: Secondary | ICD-10-CM | POA: Diagnosis not present

## 2019-07-24 DIAGNOSIS — Z79899 Other long term (current) drug therapy: Secondary | ICD-10-CM | POA: Diagnosis not present

## 2019-07-24 DIAGNOSIS — C3491 Malignant neoplasm of unspecified part of right bronchus or lung: Secondary | ICD-10-CM

## 2019-07-24 DIAGNOSIS — Z7189 Other specified counseling: Secondary | ICD-10-CM

## 2019-07-24 MED ORDER — OCTREOTIDE ACETATE 30 MG IM KIT
PACK | INTRAMUSCULAR | Status: AC
  Filled 2019-07-24: qty 1

## 2019-07-24 MED ORDER — DENOSUMAB 120 MG/1.7ML ~~LOC~~ SOLN
SUBCUTANEOUS | Status: AC
  Filled 2019-07-24: qty 1.7

## 2019-07-24 MED ORDER — DENOSUMAB 120 MG/1.7ML ~~LOC~~ SOLN
120.0000 mg | Freq: Once | SUBCUTANEOUS | Status: AC
  Administered 2019-07-24: 120 mg via SUBCUTANEOUS

## 2019-07-24 MED ORDER — OCTREOTIDE ACETATE 30 MG IM KIT
30.0000 mg | PACK | Freq: Once | INTRAMUSCULAR | Status: AC
  Administered 2019-07-24: 30 mg via INTRAMUSCULAR

## 2019-07-24 NOTE — Patient Instructions (Signed)
Denosumab injection What is this medicine? DENOSUMAB (den oh sue mab) slows bone breakdown. Prolia is used to treat osteoporosis in women after menopause and in men, and in people who are taking corticosteroids for 6 months or more. Xgeva is used to treat a high calcium level due to cancer and to prevent bone fractures and other bone problems caused by multiple myeloma or cancer bone metastases. Xgeva is also used to treat giant cell tumor of the bone. This medicine may be used for other purposes; ask your health care provider or pharmacist if you have questions. COMMON BRAND NAME(S): Prolia, XGEVA What should I tell my health care provider before I take this medicine? They need to know if you have any of these conditions:  dental disease  having surgery or tooth extraction  infection  kidney disease  low levels of calcium or Vitamin D in the blood  malnutrition  on hemodialysis  skin conditions or sensitivity  thyroid or parathyroid disease  an unusual reaction to denosumab, other medicines, foods, dyes, or preservatives  pregnant or trying to get pregnant  breast-feeding How should I use this medicine? This medicine is for injection under the skin. It is given by a health care professional in a hospital or clinic setting. A special MedGuide will be given to you before each treatment. Be sure to read this information carefully each time. For Prolia, talk to your pediatrician regarding the use of this medicine in children. Special care may be needed. For Xgeva, talk to your pediatrician regarding the use of this medicine in children. While this drug may be prescribed for children as young as 13 years for selected conditions, precautions do apply. Overdosage: If you think you have taken too much of this medicine contact a poison control center or emergency room at once. NOTE: This medicine is only for you. Do not share this medicine with others. What if I miss a dose? It is  important not to miss your dose. Call your doctor or health care professional if you are unable to keep an appointment. What may interact with this medicine? Do not take this medicine with any of the following medications:  other medicines containing denosumab This medicine may also interact with the following medications:  medicines that lower your chance of fighting infection  steroid medicines like prednisone or cortisone This list may not describe all possible interactions. Give your health care provider a list of all the medicines, herbs, non-prescription drugs, or dietary supplements you use. Also tell them if you smoke, drink alcohol, or use illegal drugs. Some items may interact with your medicine. What should I watch for while using this medicine? Visit your doctor or health care professional for regular checks on your progress. Your doctor or health care professional may order blood tests and other tests to see how you are doing. Call your doctor or health care professional for advice if you get a fever, chills or sore throat, or other symptoms of a cold or flu. Do not treat yourself. This drug may decrease your body's ability to fight infection. Try to avoid being around people who are sick. You should make sure you get enough calcium and vitamin D while you are taking this medicine, unless your doctor tells you not to. Discuss the foods you eat and the vitamins you take with your health care professional. See your dentist regularly. Brush and floss your teeth as directed. Before you have any dental work done, tell your dentist you are   receiving this medicine. Do not become pregnant while taking this medicine or for 5 months after stopping it. Talk with your doctor or health care professional about your birth control options while taking this medicine. Women should inform their doctor if they wish to become pregnant or think they might be pregnant. There is a potential for serious side  effects to an unborn child. Talk to your health care professional or pharmacist for more information. What side effects may I notice from receiving this medicine? Side effects that you should report to your doctor or health care professional as soon as possible:  allergic reactions like skin rash, itching or hives, swelling of the face, lips, or tongue  bone pain  breathing problems  dizziness  jaw pain, especially after dental work  redness, blistering, peeling of the skin  signs and symptoms of infection like fever or chills; cough; sore throat; pain or trouble passing urine  signs of low calcium like fast heartbeat, muscle cramps or muscle pain; pain, tingling, numbness in the hands or feet; seizures  unusual bleeding or bruising  unusually weak or tired Side effects that usually do not require medical attention (report to your doctor or health care professional if they continue or are bothersome):  constipation  diarrhea  headache  joint pain  loss of appetite  muscle pain  runny nose  tiredness  upset stomach This list may not describe all possible side effects. Call your doctor for medical advice about side effects. You may report side effects to FDA at 1-800-FDA-1088. Where should I keep my medicine? This medicine is only given in a clinic, doctor's office, or other health care setting and will not be stored at home. NOTE: This sheet is a summary. It may not cover all possible information. If you have questions about this medicine, talk to your doctor, pharmacist, or health care provider.  2020 [employer] Standard (2018-03-30 16:10:44) Octreotide injection solution What is this medicine? OCTREOTIDE (ok TREE oh tide) is used to reduce blood levels of growth hormone in patients with a condition called acromegaly. This medicine also reduces flushing and watery diarrhea caused by certain types of cancer. This medicine may be used for other purposes; ask your  health care provider or pharmacist if you have questions. COMMON BRAND NAME(S): Bynfezia, Sandostatin, Sandostatin LAR What should I tell my health care provider before I take this medicine? They need to know if you have any of these conditions:  diabetes  gallbladder disease  kidney disease  liver disease  an unusual or allergic reaction to octreotide, other medicines, foods, dyes, or preservatives  pregnant or trying to get pregnant  breast-feeding How should I use this medicine? This medicine is for injection under the skin or into a vein (only in emergency situations). It is usually given by a health care professional in a hospital or clinic setting. If you get this medicine at home, you will be taught how to prepare and give this medicine. Allow the injection solution to come to room temperature before use. Do not warm it artificially. Use exactly as directed. Take your medicine at regular intervals. Do not take your medicine more often than directed. It is important that you put your used needles and syringes in a special sharps container. Do not put them in a trash can. If you do not have a sharps container, call your pharmacist or healthcare provider to get one. Talk to your pediatrician regarding the use of this medicine in children. Special care  may be needed. Overdosage: If you think you have taken too much of this medicine contact a poison control center or emergency room at once. NOTE: This medicine is only for you. Do not share this medicine with others. What if I miss a dose? If you miss a dose, take it as soon as you can. If it is almost time for your next dose, take only that dose. Do not take double or extra doses. What may interact with this medicine? Do not take this medicine with any of the following medications:  cisapride  droperidol  general anesthetics  grepafloxacin  perphenazine  thioridazine This medicine may also interact with the following  medications:  bromocriptine  cyclosporine  diuretics  medicines for blood pressure, heart disease, irregular heart beat  medicines for diabetes, including insulin  quinidine This list may not describe all possible interactions. Give your health care provider a list of all the medicines, herbs, non-prescription drugs, or dietary supplements you use. Also tell them if you smoke, drink alcohol, or use illegal drugs. Some items may interact with your medicine. What should I watch for while using this medicine? Visit your doctor or health care professional for regular checks on your progress. To help reduce irritation at the injection site, use a different site for each injection and make sure the solution is at room temperature before use. This medicine may cause decreases in blood sugar. Signs of low blood sugar include chills, cool, pale skin or cold sweats, drowsiness, extreme hunger, fast heartbeat, headache, nausea, nervousness or anxiety, shakiness, trembling, unsteadiness, tiredness, or weakness. Contact your doctor or health care professional right away if you experience any of these symptoms. This medicine may increase blood sugar. Ask your healthcare provider if changes in diet or medicines are needed if you have diabetes. This medicine may cause a decrease in vitamin B12. You should make sure that you get enough vitamin B12 while you are taking this medicine. Discuss the foods you eat and the vitamins you take with your health care professional. What side effects may I notice from receiving this medicine? Side effects that you should report to your doctor or health care professional as soon as possible:  allergic reactions like skin rash, itching or hives, swelling of the face, lips, or tongue  decreases in blood sugar  changes in heart rate  severe stomach pain   signs and symptoms of high blood sugar such as being more thirsty or hungry or having to urinate more than normal.  You may also feel very tired or have blurry vision. Side effects that usually do not require medical attention (report to your doctor or health care professional if they continue or are bothersome):  diarrhea or constipation  gas or stomach pain  nausea, vomiting  pain, redness, swelling and irritation at site where injected This list may not describe all possible side effects. Call your doctor for medical advice about side effects. You may report side effects to FDA at 1-800-FDA-1088. Where should I keep my medicine? Keep out of the reach of children. Store in a refrigerator between 2 and 8 degrees C (36 and 46 degrees F). Protect from light. Allow to come to room temperature naturally. Do not use artificial heat. If protected from light, the injection may be stored at room temperature between 20 and 30 degrees C (70 and 86 degrees F) for 14 days. After the initial use, throw away any unused portion of a multiple dose vial after 14 days. Throw  away unused portions of the ampules after use. NOTE: This sheet is a summary. It may not cover all possible information. If you have questions about this medicine, talk to your doctor, pharmacist, or health care provider.  2020 [employer] Standard (2018-08-30 08:07:09)

## 2019-07-24 NOTE — Telephone Encounter (Signed)
Scheduled apt per 8/19 sch message - unable to reach pt . Mailed letter with appts added.

## 2019-07-24 NOTE — Progress Notes (Signed)
Ok to receive xgeva injection today per Dr. Irene Limbo without lab work today.

## 2019-07-30 ENCOUNTER — Other Ambulatory Visit: Payer: Self-pay | Admitting: Hematology

## 2019-08-06 ENCOUNTER — Other Ambulatory Visit: Payer: Self-pay | Admitting: Adult Health

## 2019-08-21 ENCOUNTER — Inpatient Hospital Stay: Payer: Medicare Other | Attending: Hematology

## 2019-08-21 ENCOUNTER — Other Ambulatory Visit: Payer: Self-pay

## 2019-08-21 ENCOUNTER — Inpatient Hospital Stay: Payer: Medicare Other

## 2019-08-21 VITALS — BP 172/66 | HR 68 | Temp 98.5°F | Resp 18

## 2019-08-21 DIAGNOSIS — R197 Diarrhea, unspecified: Secondary | ICD-10-CM | POA: Diagnosis not present

## 2019-08-21 DIAGNOSIS — C155 Malignant neoplasm of lower third of esophagus: Secondary | ICD-10-CM

## 2019-08-21 DIAGNOSIS — Z7901 Long term (current) use of anticoagulants: Secondary | ICD-10-CM | POA: Insufficient documentation

## 2019-08-21 DIAGNOSIS — Z7189 Other specified counseling: Secondary | ICD-10-CM

## 2019-08-21 DIAGNOSIS — C7951 Secondary malignant neoplasm of bone: Secondary | ICD-10-CM | POA: Diagnosis not present

## 2019-08-21 DIAGNOSIS — Z86711 Personal history of pulmonary embolism: Secondary | ICD-10-CM | POA: Diagnosis not present

## 2019-08-21 DIAGNOSIS — Z86718 Personal history of other venous thrombosis and embolism: Secondary | ICD-10-CM | POA: Diagnosis not present

## 2019-08-21 DIAGNOSIS — Z87891 Personal history of nicotine dependence: Secondary | ICD-10-CM | POA: Diagnosis not present

## 2019-08-21 DIAGNOSIS — K521 Toxic gastroenteritis and colitis: Secondary | ICD-10-CM

## 2019-08-21 DIAGNOSIS — C3490 Malignant neoplasm of unspecified part of unspecified bronchus or lung: Secondary | ICD-10-CM

## 2019-08-21 DIAGNOSIS — K529 Noninfective gastroenteritis and colitis, unspecified: Secondary | ICD-10-CM

## 2019-08-21 DIAGNOSIS — Z23 Encounter for immunization: Secondary | ICD-10-CM | POA: Diagnosis not present

## 2019-08-21 DIAGNOSIS — C3491 Malignant neoplasm of unspecified part of right bronchus or lung: Secondary | ICD-10-CM

## 2019-08-21 DIAGNOSIS — Z95828 Presence of other vascular implants and grafts: Secondary | ICD-10-CM

## 2019-08-21 LAB — CBC WITH DIFFERENTIAL/PLATELET
Abs Immature Granulocytes: 0.01 10*3/uL (ref 0.00–0.07)
Basophils Absolute: 0.1 10*3/uL (ref 0.0–0.1)
Basophils Relative: 1 %
Eosinophils Absolute: 0.1 10*3/uL (ref 0.0–0.5)
Eosinophils Relative: 2 %
HCT: 38.5 % (ref 36.0–46.0)
Hemoglobin: 12.1 g/dL (ref 12.0–15.0)
Immature Granulocytes: 0 %
Lymphocytes Relative: 20 %
Lymphs Abs: 1.1 10*3/uL (ref 0.7–4.0)
MCH: 27.9 pg (ref 26.0–34.0)
MCHC: 31.4 g/dL (ref 30.0–36.0)
MCV: 88.7 fL (ref 80.0–100.0)
Monocytes Absolute: 0.6 10*3/uL (ref 0.1–1.0)
Monocytes Relative: 11 %
Neutro Abs: 3.6 10*3/uL (ref 1.7–7.7)
Neutrophils Relative %: 66 %
Platelets: 169 10*3/uL (ref 150–400)
RBC: 4.34 MIL/uL (ref 3.87–5.11)
RDW: 14.8 % (ref 11.5–15.5)
WBC: 5.5 10*3/uL (ref 4.0–10.5)
nRBC: 0 % (ref 0.0–0.2)

## 2019-08-21 LAB — CMP (CANCER CENTER ONLY)
ALT: 10 U/L (ref 0–44)
AST: 25 U/L (ref 15–41)
Albumin: 3.7 g/dL (ref 3.5–5.0)
Alkaline Phosphatase: 55 U/L (ref 38–126)
Anion gap: 7 (ref 5–15)
BUN: 20 mg/dL (ref 8–23)
CO2: 26 mmol/L (ref 22–32)
Calcium: 10.1 mg/dL (ref 8.9–10.3)
Chloride: 108 mmol/L (ref 98–111)
Creatinine: 1.06 mg/dL — ABNORMAL HIGH (ref 0.44–1.00)
GFR, Est AFR Am: 58 mL/min — ABNORMAL LOW (ref >60)
GFR, Estimated: 50 mL/min — ABNORMAL LOW (ref >60)
Glucose, Bld: 96 mg/dL (ref 70–99)
Potassium: 4.8 mmol/L (ref 3.5–5.1)
Sodium: 141 mmol/L (ref 135–145)
Total Bilirubin: 0.4 mg/dL (ref 0.3–1.2)
Total Protein: 7.8 g/dL (ref 6.5–8.1)

## 2019-08-21 LAB — MAGNESIUM: Magnesium: 1.6 mg/dL — ABNORMAL LOW (ref 1.7–2.4)

## 2019-08-21 MED ORDER — DENOSUMAB 120 MG/1.7ML ~~LOC~~ SOLN
SUBCUTANEOUS | Status: AC
  Filled 2019-08-21: qty 1.7

## 2019-08-21 MED ORDER — DENOSUMAB 120 MG/1.7ML ~~LOC~~ SOLN
120.0000 mg | Freq: Once | SUBCUTANEOUS | Status: AC
  Administered 2019-08-21: 120 mg via SUBCUTANEOUS

## 2019-08-21 MED ORDER — OCTREOTIDE ACETATE 30 MG IM KIT
PACK | INTRAMUSCULAR | Status: AC
  Filled 2019-08-21: qty 1

## 2019-08-21 MED ORDER — OCTREOTIDE ACETATE 30 MG IM KIT
30.0000 mg | PACK | Freq: Once | INTRAMUSCULAR | Status: AC
  Administered 2019-08-21: 30 mg via INTRAMUSCULAR

## 2019-08-21 NOTE — Patient Instructions (Signed)
Octreotide injection solution What is this medicine? OCTREOTIDE (ok TREE oh tide) is used to reduce blood levels of growth hormone in patients with a condition called acromegaly. This medicine also reduces flushing and watery diarrhea caused by certain types of cancer. This medicine may be used for other purposes; ask your health care provider or pharmacist if you have questions. COMMON BRAND NAME(S): Bynfezia, Sandostatin, Sandostatin LAR What should I tell my health care provider before I take this medicine? They need to know if you have any of these conditions:  diabetes  gallbladder disease  kidney disease  liver disease  an unusual or allergic reaction to octreotide, other medicines, foods, dyes, or preservatives  pregnant or trying to get pregnant  breast-feeding How should I use this medicine? This medicine is for injection under the skin or into a vein (only in emergency situations). It is usually given by a health care professional in a hospital or clinic setting. If you get this medicine at home, you will be taught how to prepare and give this medicine. Allow the injection solution to come to room temperature before use. Do not warm it artificially. Use exactly as directed. Take your medicine at regular intervals. Do not take your medicine more often than directed. It is important that you put your used needles and syringes in a special sharps container. Do not put them in a trash can. If you do not have a sharps container, call your pharmacist or healthcare provider to get one. Talk to your pediatrician regarding the use of this medicine in children. Special care may be needed. Overdosage: If you think you have taken too much of this medicine contact a poison control center or emergency room at once. NOTE: This medicine is only for you. Do not share this medicine with others. What if I miss a dose? If you miss a dose, take it as soon as you can. If it is almost time for your  next dose, take only that dose. Do not take double or extra doses. What may interact with this medicine? Do not take this medicine with any of the following medications:  cisapride  droperidol  general anesthetics  grepafloxacin  perphenazine  thioridazine This medicine may also interact with the following medications:  bromocriptine  cyclosporine  diuretics  medicines for blood pressure, heart disease, irregular heart beat  medicines for diabetes, including insulin  quinidine This list may not describe all possible interactions. Give your health care provider a list of all the medicines, herbs, non-prescription drugs, or dietary supplements you use. Also tell them if you smoke, drink alcohol, or use illegal drugs. Some items may interact with your medicine. What should I watch for while using this medicine? Visit your doctor or health care professional for regular checks on your progress. To help reduce irritation at the injection site, use a different site for each injection and make sure the solution is at room temperature before use. This medicine may cause decreases in blood sugar. Signs of low blood sugar include chills, cool, pale skin or cold sweats, drowsiness, extreme hunger, fast heartbeat, headache, nausea, nervousness or anxiety, shakiness, trembling, unsteadiness, tiredness, or weakness. Contact your doctor or health care professional right away if you experience any of these symptoms. This medicine may increase blood sugar. Ask your healthcare provider if changes in diet or medicines are needed if you have diabetes. This medicine may cause a decrease in vitamin B12. You should make sure that you get enough vitamin B12  while you are taking this medicine. Discuss the foods you eat and the vitamins you take with your health care professional. What side effects may I notice from receiving this medicine? Side effects that you should report to your doctor or health care  professional as soon as possible:  allergic reactions like skin rash, itching or hives, swelling of the face, lips, or tongue  decreases in blood sugar  changes in heart rate  severe stomach pain   signs and symptoms of high blood sugar such as being more thirsty or hungry or having to urinate more than normal. You may also feel very tired or have blurry vision. Side effects that usually do not require medical attention (report to your doctor or health care professional if they continue or are bothersome):  diarrhea or constipation  gas or stomach pain  nausea, vomiting  pain, redness, swelling and irritation at site where injected This list may not describe all possible side effects. Call your doctor for medical advice about side effects. You may report side effects to FDA at 1-800-FDA-1088. Where should I keep my medicine? Keep out of the reach of children. Store in a refrigerator between 2 and 8 degrees C (36 and 46 degrees F). Protect from light. Allow to come to room temperature naturally. Do not use artificial heat. If protected from light, the injection may be stored at room temperature between 20 and 30 degrees C (70 and 86 degrees F) for 14 days. After the initial use, throw away any unused portion of a multiple dose vial after 14 days. Throw away unused portions of the ampules after use. NOTE: This sheet is a summary. It may not cover all possible information. If you have questions about this medicine, talk to your doctor, pharmacist, or health care provider.  2020 [employer] (2018-08-30 08:07:09) Denosumab injection What is this medicine? DENOSUMAB (den oh sue mab) slows bone breakdown. Prolia is used to treat osteoporosis in women after menopause and in men, and in people who are taking corticosteroids for 6 months or more. Delton See is used to treat a high calcium level due to cancer and to prevent bone fractures and other bone problems caused by multiple myeloma or  cancer bone metastases. Delton See is also used to treat giant cell tumor of the bone. This medicine may be used for other purposes; ask your health care provider or pharmacist if you have questions. COMMON BRAND NAME(S): Prolia, XGEVA What should I tell my health care provider before I take this medicine? They need to know if you have any of these conditions:  dental disease  having surgery or tooth extraction  infection  kidney disease  low levels of calcium or Vitamin D in the blood  malnutrition  on hemodialysis  skin conditions or sensitivity  thyroid or parathyroid disease  an unusual reaction to denosumab, other medicines, foods, dyes, or preservatives  pregnant or trying to get pregnant  breast-feeding How should I use this medicine? This medicine is for injection under the skin. It is given by a health care professional in a hospital or clinic setting. A special MedGuide will be given to you before each treatment. Be sure to read this information carefully each time. For Prolia, talk to your pediatrician regarding the use of this medicine in children. Special care may be needed. For Delton See, talk to your pediatrician regarding the use of this medicine in children. While this drug may be prescribed for children as young as 13 years for selected  conditions, precautions do apply. Overdosage: If you think you have taken too much of this medicine contact a poison control center or emergency room at once. NOTE: This medicine is only for you. Do not share this medicine with others. What if I miss a dose? It is important not to miss your dose. Call your doctor or health care professional if you are unable to keep an appointment. What may interact with this medicine? Do not take this medicine with any of the following medications:  other medicines containing denosumab This medicine may also interact with the following medications:  medicines that lower your chance of fighting  infection  steroid medicines like prednisone or cortisone This list may not describe all possible interactions. Give your health care provider a list of all the medicines, herbs, non-prescription drugs, or dietary supplements you use. Also tell them if you smoke, drink alcohol, or use illegal drugs. Some items may interact with your medicine. What should I watch for while using this medicine? Visit your doctor or health care professional for regular checks on your progress. Your doctor or health care professional may order blood tests and other tests to see how you are doing. Call your doctor or health care professional for advice if you get a fever, chills or sore throat, or other symptoms of a cold or flu. Do not treat yourself. This drug may decrease your body's ability to fight infection. Try to avoid being around people who are sick. You should make sure you get enough calcium and vitamin D while you are taking this medicine, unless your doctor tells you not to. Discuss the foods you eat and the vitamins you take with your health care professional. See your dentist regularly. Brush and floss your teeth as directed. Before you have any dental work done, tell your dentist you are receiving this medicine. Do not become pregnant while taking this medicine or for 5 months after stopping it. Talk with your doctor or health care professional about your birth control options while taking this medicine. Women should inform their doctor if they wish to become pregnant or think they might be pregnant. There is a potential for serious side effects to an unborn child. Talk to your health care professional or pharmacist for more information. What side effects may I notice from receiving this medicine? Side effects that you should report to your doctor or health care professional as soon as possible:  allergic reactions like skin rash, itching or hives, swelling of the face, lips, or tongue  bone  pain  breathing problems  dizziness  jaw pain, especially after dental work  redness, blistering, peeling of the skin  signs and symptoms of infection like fever or chills; cough; sore throat; pain or trouble passing urine  signs of low calcium like fast heartbeat, muscle cramps or muscle pain; pain, tingling, numbness in the hands or feet; seizures  unusual bleeding or bruising  unusually weak or tired Side effects that usually do not require medical attention (report to your doctor or health care professional if they continue or are bothersome):  constipation  diarrhea  headache  joint pain  loss of appetite  muscle pain  runny nose  tiredness  upset stomach This list may not describe all possible side effects. Call your doctor for medical advice about side effects. You may report side effects to FDA at 1-800-FDA-1088. Where should I keep my medicine? This medicine is only given in a clinic, doctor's office, or other health care  setting and will not be stored at home. NOTE: This sheet is a summary. It may not cover all possible information. If you have questions about this medicine, talk to your doctor, pharmacist, or health care provider.  2020 [employer] (2018-03-30 16:10:44)

## 2019-08-29 NOTE — Progress Notes (Signed)
HEMATOLOGY ONCOLOGY PROGRESS NOTE  Date of service:  09/04/19    Byrd Care Team: [name], NP as PCP - General (Family Medicine) [name], MD as Consulting Physician (Hematology and Oncology)  CHIEF COMPLAINTS/PURPOSE OF CONSULTATION:  F/u for metastatic lung cancer  DIAGNOSIS:   #1 Metastatic non-small cell lung cancer with bilateral lung nodules and large metastatic lesion in Cindy left Ilium. 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 Also had c diff colitis - treated   Current Treatment  1) Active surveillance 2) Xgeva 18m  q4weeks for bone metastases. 3) Sandostatin q4weeks for diarrhea - immune colitis  Previous Treatment  1 Palliative radiation therapy to Cindy large left ilium metastases 2. IV Nivolumab x 20ycles (discontinued due to likely immune colitis) 3. Xgeva 1250mSC q4weeks for bone metastases.   HISTORY OF PRESENTING ILLNESS: (plz see my previous consultation for details of initial presentation)  INTERVAL HISTORY:  Cindy Byrd presenting today for her scheduled follow-up for metastatic lung cancer, and adenocarcinoma of Cindy esophagus. Cindy Byrd[place] last visit with [place] on 07/10/2019. Cindy pt reports that she is doing well overall.  Cindy pt reports that she has been eating well and holding her weight steady. Her esophagus has been hurting and prevents her from swallowing sometimes. Pt's esophogeal pain is accompanied by a burning sensation and has been worsening lately. It is harder for her to swallow a solid than a liquid but both are difficult for her. She has recently been coughing up thick mucous that is sometime accompanied by blood. Pt has noticed that her blood pressure has been elevated lately. She denies any dental concerns at this time.   Lab results today (09/04/19) of CBC w/diff and CMP is as follows: all values are WNL except for Hgb at 10.9, HCT at 34.4, RDW at  15.7, Chloride at 113, CO2 at 20, Creatinine at 1.12. 09/04/2019 Magnesium at 1.6  On review of systems, pt reports issues swallowing, hemoptysis, elevated BP and denies diarrhea, bloody/black stools, worsening breathing issues, pains in Cindy hip or pelvis, dental issues, unexpected weight loss and any other symptoms.    MEDICAL HISTORY:  Past Medical History:  Diagnosis Date  . Barrett's esophagus   . Bone neoplasm 06/24/2015  . Cancer ([facility]   metastatic poorly differentiated carcinoma. tumor left groin surgical removal with radiation tx.  . Cataract    BILATERAL  . Cigarette smoker two packs a day or less    Currently still smoking 2 PPD - Not interested in quitting at this time.  . Colitis 2017  . Colon polyps    hyperplastic, tubular adenomas, tubulovillous adenoma  . Cough, persistent    hx. lung cancer ? primary-being evaluated, unsure of primary site.  . Depression 06/24/2015  . Diverticulosis   . Emphysema of lung ([facility]  . Endometriosis    Hysterectomy with BSO at age [age]rs  . Esophageal adenocarcinoma ([facility]   intramucosal  . Gastritis   . GERD (gastroesophageal reflux disease)   . H/O: pneumonia   . Hiatal hernia   . Hyperlipidemia   . Hypertension 06/24/2015   likely improved incidental to 40 lbs weight loss from her neoplasm. No Longer taking med for this as of 08-06-15  . IBS (irritable bowel syndrome)   . Pain    left hip-persistent"tumor of bone"-radiation tx. 10.  . Vitamin D deficiency disease    SURGICAL  HISTORY: Past Surgical History:  Procedure Laterality Date  . BARTHOLIN GLAND CYST EXCISION  78 yo ago   Does not want if it was an infected cyst or tumor. Was soon as delivery  . BIOPSY  01/02/2019   Procedure: BIOPSY;  Surgeon: [name], MD;  Location: [facility];  Service: Gastroenterology;;  . CATARACT EXTRACTION    . COLONOSCOPY W/ POLYPECTOMY     multiple times - last done 09/2014 per Byrd.  . ESOPHAGOGASTRODUODENOSCOPY (EGD) WITH  PROPOFOL N/A 08/11/2015   Procedure: ESOPHAGOGASTRODUODENOSCOPY (EGD) WITH PROPOFOL;  Surgeon: [name], MD;  Location: [facility];  Service: Gastroenterology;  Laterality: N/A;  . ESOPHAGOGASTRODUODENOSCOPY (EGD) WITH PROPOFOL N/A 01/02/2019   Procedure: ESOPHAGOGASTRODUODENOSCOPY (EGD) WITH PROPOFOL;  Surgeon: [name], MD;  Location: [facility];  Service: Gastroenterology;  Laterality: N/A;  . FLEXIBLE SIGMOIDOSCOPY N/A 06/24/2017   Procedure: FLEXIBLE SIGMOIDOSCOPY;  Surgeon: Manus Gunning, MD;  Location: [facility];  Service: Gastroenterology;  Laterality: N/A;  . GANGLION CYST EXCISION    . KNEE ARTHROSCOPY  age about 47 yrs  . TONSILLECTOMY    . TOTAL ABDOMINAL HYSTERECTOMY W/ BILATERAL SALPINGOOPHORECTOMY  at age 49 yrs   For endometriosis    SOCIAL HISTORY: Social History   Socioeconomic History  . Marital status: Widowed    Spouse name: Not on file  . Number of children: 2  . Years of education: Not on file  . Highest education level: Not on file  Occupational History  . Not on file  Social Needs  . Financial resource strain: Not on file  . Food insecurity    Worry: Not on file    Inability: Not on file  . Transportation needs    Medical: No    Non-medical: No  Tobacco Use  . Smoking status: Former Smoker    Packs/day: 1.00    Years: 60.00    Pack years: 60.00    Types: Cigarettes    Quit date: 12/05/2014    Years since quitting: 4.7  . Smokeless tobacco: Never Used  Substance and Sexual Activity  . Alcohol use: No    Alcohol/week: 0.0 standard drinks  . Drug use: No  . Sexual activity: Never  Lifestyle  . Physical activity    Days per week: Not on file    Minutes per session: Not on file  . Stress: Not on file  Relationships  . Social Herbalist on phone: Not on file    Gets together: Not on file    Attends religious service: Not on file    Active member of club or organization: Not on file    Attends meetings of clubs  or organizations: Not on file    Relationship status: Not on file  . Intimate partner violence    Fear of current or ex partner: Not on file    Emotionally abused: Not on file    Physically abused: Not on file    Forced sexual activity: Not on file  Other Topics Concern  . Not on file  Social History Narrative  . Not on file    FAMILY HISTORY: Family History  Problem Relation Age of Onset  . Colon cancer Brother   . Colon cancer Brother   . Stroke Mother   . Colon cancer Father   . Emphysema Father        smoked  . Breast cancer Daughter 25       ER/PR+ stage II  ALLERGIES:  is allergic to penicillins; remeron [mirtazapine]; and latex. Byrd wonders if she has a penicillin allergy but notes that she is uncertain about this.  MEDICATIONS:  Current Outpatient Medications  Medication Sig Dispense Refill  . amitriptyline (ELAVIL) 25 MG tablet Take 25 mg by mouth at bedtime.     . Cholecalciferol (VITAMIN D3) 25 MCG (1000 UT) CAPS Take 1,000 Units by mouth daily.    [name] dexamethasone (DECADRON) 2 MG tablet Take 1 tablet (2 mg total) by mouth daily with breakfast. 30 tablet 0  . FLUoxetine (PROZAC) 20 MG capsule Take 1 capsule (20 mg total) by mouth daily. 90 capsule 3  . KLOR-CON M20 20 MEQ tablet TAKE 1 TABLET BY MOUTH TWICE A DAY (Byrd taking differently: Take 20 mEq by mouth daily. ) 60 tablet 1  . lidocaine-prilocaine (EMLA) cream Apply to affected area once (Byrd taking differently: Apply 1 application topically as needed (port access). Apply to affected area once) 30 g 3  . magnesium oxide (MAG-OX) 400 (241.3 Mg) MG tablet Take 1 tablet (400 mg total) by mouth daily. 60 tablet 1  . omeprazole (PRILOSEC) 40 MG capsule TAKE 1 CAPSULE BY MOUTH EVERY DAY 90 capsule 3  . Probiotic Product (PROBIOTIC PO) Take 1 capsule by mouth 2 (two) times daily.    . sucralfate (CARAFATE) 1 g tablet Take 1 tablet (1 g total) by mouth 4 (four) times daily. (Byrd taking differently:  Take 1 g by mouth daily. ) 120 tablet 2  . traZODone (DESYREL) 50 MG tablet TAKE 1 TABLET BY MOUTH EVERYDAY AT BEDTIME (Byrd taking differently: Take 50 mg by mouth at bedtime. ) 90 tablet 2  . Vitamin D, Ergocalciferol, (DRISDOL) 1.25 MG (50000 UT) CAPS capsule TAKE 1 CAPSULE BY MOUTH EVERY 7 DAYS 12 capsule 3  . VITAMIN E PO Take 1 tablet by mouth daily.    Cindy Byrd 20 MG TABS tablet TAKE 1 TABLET (20 MG TOTAL) BY MOUTH DAILY WITH SUPPER. 90 tablet 0   Current Facility-Administered Medications  Medication Dose Route Frequency Provider Last Rate Last Dose  . influenza vaccine adjuvanted (FLUAD) injection 0.5 mL  0.5 mL Intramuscular Once Irene Limbo Cloria Spring, MD        REVIEW OF SYSTEMS:    A 10+ POINT REVIEW OF SYSTEMS WAS OBTAINED including neurology, dermatology, psychiatry, cardiac, respiratory, lymph, extremities, GI, GU, Musculoskeletal, constitutional, breasts, reproductive, HEENT.  All pertinent positives are noted in Cindy HPI.  All others are negative.   PHYSICAL EXAMINATION: ECOG PERFORMANCE STATUS: 2 - Symptomatic, <50% confined to bed  Vitals:   09/04/19 1438  BP: (!) 172/68  Pulse: 66  Resp: 16  Temp: 98.3 F (36.8 C)  SpO2: 100%   Filed Weights   09/04/19 1438  Weight: 133 lb 14.4 oz (60.7 kg)  .  Wt Readings from Last 3 Encounters:  09/04/19 133 lb 14.4 oz (60.7 kg)  07/10/19 132 lb 12.8 oz (60.2 kg)  06/12/19 132 lb 6.4 oz (60.1 kg)    GENERAL:alert, in no acute distress and comfortable SKIN: no acute rashes, no significant lesions EYES: conjunctiva are pink and non-injected, sclera anicteric OROPHARYNX: MMM, no exudates, no oropharyngeal erythema or ulceration NECK: supple, no JVD LYMPH:  no palpable lymphadenopathy in Cindy cervical, axillary or inguinal regions LUNGS: clear to auscultation b/l with normal respiratory effort HEART: regular rate & rhythm ABDOMEN:  normoactive bowel sounds , non tender, not distended. No palpable hepatosplenomegaly.   Extremity: no pedal edema PSYCH: alert &  oriented x 3 with fluent speech NEURO: no focal motor/sensory deficits  LABORATORY DATA:  I have reviewed Cindy data as listed  . CBC Latest Ref Rng & Units 09/04/2019 08/21/2019 06/12/2019  WBC 4.0 - 10.5 K/uL 5.5 5.5 5.5  Hemoglobin 12.0 - 15.0 g/dL 10.9(L) 12.1 11.3(L)  Hematocrit 36.0 - 46.0 % 34.4(L) 38.5 36.4  Platelets 150 - 400 K/uL 181 169 183   ANC 1.8k . CMP Latest Ref Rng & Units 09/04/2019 08/21/2019 06/12/2019  Glucose 70 - 99 mg/dL 82 96 95  BUN 8 - 23 mg/dL 16 20 15   Creatinine 0.44 - 1.00 mg/dL 1.12(H) 1.06(H) 1.01(H)  Sodium 135 - 145 mmol/L 141 141 139  Potassium 3.5 - 5.1 mmol/L 4.5 4.8 5.3(H)  Chloride 98 - 111 mmol/L 113(H) 108 108  CO2 22 - 32 mmol/L 20(L) 26 26  Calcium 8.9 - 10.3 mg/dL 9.5 10.1 9.8  Total Protein 6.5 - 8.1 g/dL 7.5 7.8 7.4  Total Bilirubin 0.3 - 1.2 mg/dL 0.4 0.4 0.2(L)  Alkaline Phos 38 - 126 U/L 53 55 76  AST 15 - 41 U/L 23 25 31   ALT 0 - 44 U/L 9 10 11        01/02/19 Esophagus Biopsy:    RADIOGRAPHIC STUDIES:  .No results found.  ASSESSMENT & PLAN:   78 y.o. female with  #1 Metastatic poorly differentiated carcinoma with likely lung primary non-small cell lung cancer.   CT of Cindy head with and without contrast showed no evidence of metastatic disease. EGFR blood test mutation analysis negative. CT chest abdomen pelvis 04/19/2016 shows no evidence of disease progression. Byrd tolerated Nivolumab very well but was discontinued when she developed grade 2 Immune colitis. Has been off Nivolumab for >6 months  CT chest abdomen pelvis on 06/24/2016 shows no evidence of new disease or progression of metastatic disease. CT chest abdomen pelvis 09/06/2016 shows 1. Mixed interval response to therapy. 2. There is a new left ventral chest wall lesion deep to Cindy pectoralis musculature worrisome for metastatic disease. 3. Posterior lower lobe nodular densities are identified which may reflect areas  of pulmonary metastasis. 4. Interval decrease in size of destructive lesion involving Cindy left iliac bone.  CT chest abd pelvis 12/08/2016: Cystic mass involving Cindy left ventral chest wall has resolved in Cindy interval. Likely was a hematoma due to trauma. Interval increase in size of pleural base mass overlying Cindy posterior and inferior left lower lobe. There is also a new left pleural effusion identified.  CT chest 02/01/2017: Residual irregular soft tissue thickening/volume loss and trace left pleural fluid at Cindy base of Cindy left hemithorax, overall improved in appearance from 12/08/2016. No measurable lesion.  CT chest 05/29/2017 shows no residual pleural based mass or significant pleural effusion in Cindy left hemithorax. No evidence of thoracic metastatic disease. No evidence of progressive metastatic disease within Cindy abdomen or pelvis. Mixed lytic and blastic lesion involving Cindy left iliac bone and associated pathologic fracture are unchanged.   CT CAP 09/14/17 shows no new changes. She does have slight displacement of her fractured left iliac bone. Evidence of stable disease.   CT CAP 01/04/2018- No new or progressive metastatic disease. Stable large left iliac bone metastasis with associated chronic pathologic fracture.   CT chest/abd/pelvis done on 04/26/18 revealed Stable exam.  No new or progressive interval findings.  07/19/18 CT C/A/P revealed Stable exam.  No new or progressive interval findings. Large destructive left iliac lesion is similar to prior. Aortic Atherosclerosis and  Emphysema.    11/06/18 CT C/A/P revealed Similar appearance of large mixed lytic and sclerotic lesion in Cindy left ilium. No new metastatic lesions are otherwise noted elsewhere in Cindy chest, abdomen or pelvis. 2. Interval development of thickening of Cindy distal third of Cindy esophagus. This is nonspecific, and could be related to underlying reflux esophagitis. However, if there is any clinical concern for Barrett's  metaplasia or esophageal neoplasia, further evaluation with nonemergent endoscopy could be considered. 3. Aortic atherosclerosis, in addition to left main coronary artery disease. Assessment for potential risk factor modification, dietary therapy or pharmacologic therapy may be warranted, if clinically Indicated. 4. Diffuse bronchial wall thickening with mild to moderate centrilobular and paraseptal emphysema; imaging findings suggestive of underlying COPD. 5. Additional incidental findings, as above.   #2 Newly diagnosed Adenocarcinoma of Cindy esophagus  Barrett's esophagus 4cms in Cindy distal esophagus with low and high-grade dysplasia  01/02/19 Surgical pathology revealed adenocarcinoma of Cindy esophagus   01/25/19 PET/CT revealed Distal esophageal primary, without hypermetabolic metastatic disease. 2. Chronic left iliac metastasis, as before. 3. Hypermetabolism within and superficial to Cindy right gluteal musculature is most likely related to trauma and/or injection sites. 4. Aortic atherosclerosis, coronary artery atherosclerosis and emphysema.  S/p concurrent Carboplatin and Taxol weekly with RT of 45 Gy in 25 fractions and 5.4 Gy boost, completed between 02/04/19 and 03/27/19  #3 diarrhea-  now resolved was previously. 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 She was previously on on Lialda, budesonide,probiotics and lomotil but not currently taking any of these. Plan -Continue Sandostatin every 4 weeks   #4 h/o diverticulitis and c diff colitis - now resolved Plan  -continue on sandostatin today and q4weeks.  -continue on Lialda  #5 DVT and PE  -continue on Xarelto - no issues with bleeding   #6 Dsypnea 03/14/19 ECHO revealed LV EF of 60-65% 03/06/19 CXR revealed clear lung fields, normal heart size 03/07/19 and 02/25/19 EKGs, no overt concern but some decreased QRS  amplitude Did refer to pulmonology for further evaluation and lung function testing  Began steroid inhaler to mitigate possible inflammation in airway, could be some radiation related scarring and emphysema  07/03/2019 PET skull base to thigh revealed "1. Interval response to therapy. Significant reduction in FDG uptake associated with distal esophageal mass. SUV max currently 2.61 versus 16.97 previously. 2. 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 Unchanged 3. Aortic Atherosclerosis (ICD10-I70.0) and Emphysema (ICD10-J43.9). Coronary artery calcifications."  PLAN: -Discussed pt labwork today, 09/04/19; all values are WNL except for Hgb at 10.9, HCT at 34.4, RDW at 15.7, Chloride at 113, CO2 at 20, Creatinine at 1.12. -Discussed 09/04/2019 Magnesium at 1.6 -Will continue monthly Sandostatin -Continue with Xgeva every 4 weeks, no dental concerns at this time -Continue PO Magnesium replacement  -Continue 43m Marinol BID -Continue 2000 units Vitamin D every day, with dose adjustment to maintain 25OH Vit D levels of 40-60 -Continue Trazodone for sleep difficulty -Advised pt to keep a record of BP at home  -Will get Chest CT in 3 weeks -Will refer to Dr. PHilarie Fredricksonfor urgent f/u -Will give flu shot today -Will see back in 1 month with labs    FOLLOW UP: -GI referral to Dr JZenovia Jarredfor urgent f/u for EGD in 1 week to evaluate new dysphagia in Cindy setting of known esophageal cancer. -CT chest in 3 weeks -  RTC with dr Irene Limbo in 4 weeks continue Xgeva and Sandostatin q4weeks-plz schedule next 4 doses   Cindy total time spent in Cindy appt was 25 minutes and more than 50% was on counseling and direct Byrd cares.  All of Cindy Byrd's questions were answered with apparent satisfaction. Cindy Byrd knows to call Cindy clinic with any problems, questions or concerns.   Sullivan Lone MD [place] [facility]/On[facility] Physician [facility]  (Office):  [phone] (Work cell): [phone] (Fax): [phone]  I, Yevette Edwards, am acting as a scribe for Dr. Sullivan Lone.   .I have reviewed Cindy above documentation for accuracy and completeness, and I agree with Cindy above. [name] MD

## 2019-09-03 ENCOUNTER — Other Ambulatory Visit: Payer: Self-pay | Admitting: *Deleted

## 2019-09-03 DIAGNOSIS — C3491 Malignant neoplasm of unspecified part of right bronchus or lung: Secondary | ICD-10-CM

## 2019-09-04 ENCOUNTER — Other Ambulatory Visit: Payer: Self-pay | Admitting: *Deleted

## 2019-09-04 ENCOUNTER — Other Ambulatory Visit: Payer: Self-pay

## 2019-09-04 ENCOUNTER — Inpatient Hospital Stay (HOSPITAL_BASED_OUTPATIENT_CLINIC_OR_DEPARTMENT_OTHER): Payer: Medicare Other | Admitting: Hematology

## 2019-09-04 ENCOUNTER — Inpatient Hospital Stay: Payer: Medicare Other

## 2019-09-04 VITALS — BP 172/68 | HR 66 | Temp 98.3°F | Resp 16 | Ht 68.5 in | Wt 133.9 lb

## 2019-09-04 DIAGNOSIS — C3491 Malignant neoplasm of unspecified part of right bronchus or lung: Secondary | ICD-10-CM

## 2019-09-04 DIAGNOSIS — R197 Diarrhea, unspecified: Secondary | ICD-10-CM | POA: Diagnosis not present

## 2019-09-04 DIAGNOSIS — C155 Malignant neoplasm of lower third of esophagus: Secondary | ICD-10-CM | POA: Diagnosis not present

## 2019-09-04 DIAGNOSIS — K529 Noninfective gastroenteritis and colitis, unspecified: Secondary | ICD-10-CM

## 2019-09-04 DIAGNOSIS — Z7189 Other specified counseling: Secondary | ICD-10-CM

## 2019-09-04 DIAGNOSIS — Z86718 Personal history of other venous thrombosis and embolism: Secondary | ICD-10-CM | POA: Diagnosis not present

## 2019-09-04 DIAGNOSIS — Z7901 Long term (current) use of anticoagulants: Secondary | ICD-10-CM | POA: Diagnosis not present

## 2019-09-04 DIAGNOSIS — C3490 Malignant neoplasm of unspecified part of unspecified bronchus or lung: Secondary | ICD-10-CM

## 2019-09-04 DIAGNOSIS — Z87891 Personal history of nicotine dependence: Secondary | ICD-10-CM | POA: Diagnosis not present

## 2019-09-04 DIAGNOSIS — Z23 Encounter for immunization: Secondary | ICD-10-CM | POA: Diagnosis not present

## 2019-09-04 DIAGNOSIS — C7951 Secondary malignant neoplasm of bone: Secondary | ICD-10-CM | POA: Diagnosis not present

## 2019-09-04 DIAGNOSIS — Z95828 Presence of other vascular implants and grafts: Secondary | ICD-10-CM

## 2019-09-04 DIAGNOSIS — K521 Toxic gastroenteritis and colitis: Secondary | ICD-10-CM

## 2019-09-04 DIAGNOSIS — Z86711 Personal history of pulmonary embolism: Secondary | ICD-10-CM | POA: Diagnosis not present

## 2019-09-04 LAB — CMP (CANCER CENTER ONLY)
ALT: 9 U/L (ref 0–44)
AST: 23 U/L (ref 15–41)
Albumin: 3.6 g/dL (ref 3.5–5.0)
Alkaline Phosphatase: 53 U/L (ref 38–126)
Anion gap: 8 (ref 5–15)
BUN: 16 mg/dL (ref 8–23)
CO2: 20 mmol/L — ABNORMAL LOW (ref 22–32)
Calcium: 9.5 mg/dL (ref 8.9–10.3)
Chloride: 113 mmol/L — ABNORMAL HIGH (ref 98–111)
Creatinine: 1.12 mg/dL — ABNORMAL HIGH (ref 0.44–1.00)
GFR, Est AFR Am: 54 mL/min — ABNORMAL LOW (ref >60)
GFR, Estimated: 47 mL/min — ABNORMAL LOW (ref >60)
Glucose, Bld: 82 mg/dL (ref 70–99)
Potassium: 4.5 mmol/L (ref 3.5–5.1)
Sodium: 141 mmol/L (ref 135–145)
Total Bilirubin: 0.4 mg/dL (ref 0.3–1.2)
Total Protein: 7.5 g/dL (ref 6.5–8.1)

## 2019-09-04 LAB — CBC WITH DIFFERENTIAL (CANCER CENTER ONLY)
Abs Immature Granulocytes: 0.01 10*3/uL (ref 0.00–0.07)
Basophils Absolute: 0.1 10*3/uL (ref 0.0–0.1)
Basophils Relative: 1 %
Eosinophils Absolute: 0.1 10*3/uL (ref 0.0–0.5)
Eosinophils Relative: 2 %
HCT: 34.4 % — ABNORMAL LOW (ref 36.0–46.0)
Hemoglobin: 10.9 g/dL — ABNORMAL LOW (ref 12.0–15.0)
Immature Granulocytes: 0 %
Lymphocytes Relative: 26 %
Lymphs Abs: 1.4 10*3/uL (ref 0.7–4.0)
MCH: 28 pg (ref 26.0–34.0)
MCHC: 31.7 g/dL (ref 30.0–36.0)
MCV: 88.4 fL (ref 80.0–100.0)
Monocytes Absolute: 0.6 10*3/uL (ref 0.1–1.0)
Monocytes Relative: 10 %
Neutro Abs: 3.3 10*3/uL (ref 1.7–7.7)
Neutrophils Relative %: 61 %
Platelet Count: 181 10*3/uL (ref 150–400)
RBC: 3.89 MIL/uL (ref 3.87–5.11)
RDW: 15.7 % — ABNORMAL HIGH (ref 11.5–15.5)
WBC Count: 5.5 10*3/uL (ref 4.0–10.5)
nRBC: 0 % (ref 0.0–0.2)

## 2019-09-04 LAB — MAGNESIUM: Magnesium: 1.6 mg/dL — ABNORMAL LOW (ref 1.7–2.4)

## 2019-09-04 MED ORDER — INFLUENZA VAC A&B SA ADJ QUAD 0.5 ML IM PRSY
PREFILLED_SYRINGE | INTRAMUSCULAR | Status: AC
  Filled 2019-09-04: qty 0.5

## 2019-09-04 MED ORDER — SODIUM CHLORIDE 0.9% FLUSH
10.0000 mL | Freq: Once | INTRAVENOUS | Status: AC
  Administered 2019-09-04: 14:00:00 10 mL
  Filled 2019-09-04: qty 10

## 2019-09-04 MED ORDER — HEPARIN SOD (PORK) LOCK FLUSH 100 UNIT/ML IV SOLN
500.0000 [IU] | Freq: Once | INTRAVENOUS | Status: AC
  Administered 2019-09-04: 500 [IU]
  Filled 2019-09-04: qty 5

## 2019-09-04 MED ORDER — INFLUENZA VAC A&B SA ADJ QUAD 0.5 ML IM PRSY
0.5000 mL | PREFILLED_SYRINGE | Freq: Once | INTRAMUSCULAR | Status: AC
  Administered 2019-09-04: 0.5 mL via INTRAMUSCULAR

## 2019-09-05 ENCOUNTER — Telehealth: Payer: Self-pay | Admitting: Hematology

## 2019-09-05 ENCOUNTER — Telehealth: Payer: Self-pay | Admitting: Internal Medicine

## 2019-09-05 NOTE — Telephone Encounter (Signed)
Hi Linda, we have received a referral from Dr. Irene Limbo for increased dysphagia and possible egd. I see that pt had an egd at the beginning of this year. Does she need an office visit or can she be scheduled directly? Referral is in Epic. Please advise.

## 2019-09-05 NOTE — Telephone Encounter (Signed)
Scheduled appt per 9/30 los.  Spoke with pt and she is aware of her appt dates and time.

## 2019-09-05 NOTE — Telephone Encounter (Signed)
Pt with hx of esophageal cancer under the care of oncology She may need an esophageal stent and dilation would not be expected to help with her dysphagia due to her malignancy I think best to perform a barium esophagram (no tablet) to eval esophageal cancer and get a sense regarding the esophageal lumen size If stent is needed, this would be done by one of my partners Thanks

## 2019-09-05 NOTE — Telephone Encounter (Signed)
See note below and advise. 

## 2019-09-06 ENCOUNTER — Other Ambulatory Visit: Payer: Self-pay

## 2019-09-06 DIAGNOSIS — C159 Malignant neoplasm of esophagus, unspecified: Secondary | ICD-10-CM

## 2019-09-06 NOTE — Telephone Encounter (Signed)
Pt scheduled for barium esophagram without tablet at [facility] 09/11/19@11am , pt to arrive there at 10:45am. Pt to be NPO after 8am. Pt aware of appt.

## 2019-09-11 ENCOUNTER — Other Ambulatory Visit: Payer: Self-pay | Admitting: Internal Medicine

## 2019-09-11 ENCOUNTER — Other Ambulatory Visit: Payer: Self-pay

## 2019-09-11 ENCOUNTER — Ambulatory Visit (HOSPITAL_COMMUNITY)
Admission: RE | Admit: 2019-09-11 | Discharge: 2019-09-11 | Disposition: A | Discharge status: Home / Self Care | Payer: Medicare Other | Source: Ambulatory Visit | Attending: Internal Medicine | Admitting: Internal Medicine

## 2019-09-11 DIAGNOSIS — C159 Malignant neoplasm of esophagus, unspecified: Secondary | ICD-10-CM | POA: Insufficient documentation

## 2019-09-11 DIAGNOSIS — K228 Other specified diseases of esophagus: Secondary | ICD-10-CM | POA: Diagnosis not present

## 2019-09-18 ENCOUNTER — Other Ambulatory Visit: Payer: Self-pay

## 2019-09-18 ENCOUNTER — Inpatient Hospital Stay: Payer: Medicare Other

## 2019-09-18 ENCOUNTER — Inpatient Hospital Stay: Payer: Medicare Other | Attending: Hematology

## 2019-09-18 VITALS — BP 132/57 | HR 66 | Temp 98.5°F | Resp 16

## 2019-09-18 DIAGNOSIS — C7951 Secondary malignant neoplasm of bone: Secondary | ICD-10-CM | POA: Diagnosis not present

## 2019-09-18 DIAGNOSIS — K529 Noninfective gastroenteritis and colitis, unspecified: Secondary | ICD-10-CM

## 2019-09-18 DIAGNOSIS — Z95828 Presence of other vascular implants and grafts: Secondary | ICD-10-CM

## 2019-09-18 DIAGNOSIS — C3490 Malignant neoplasm of unspecified part of unspecified bronchus or lung: Secondary | ICD-10-CM | POA: Diagnosis not present

## 2019-09-18 DIAGNOSIS — C159 Malignant neoplasm of esophagus, unspecified: Secondary | ICD-10-CM | POA: Insufficient documentation

## 2019-09-18 DIAGNOSIS — Z86718 Personal history of other venous thrombosis and embolism: Secondary | ICD-10-CM | POA: Diagnosis not present

## 2019-09-18 DIAGNOSIS — Z7901 Long term (current) use of anticoagulants: Secondary | ICD-10-CM | POA: Diagnosis not present

## 2019-09-18 DIAGNOSIS — C155 Malignant neoplasm of lower third of esophagus: Secondary | ICD-10-CM

## 2019-09-18 DIAGNOSIS — Z87891 Personal history of nicotine dependence: Secondary | ICD-10-CM | POA: Insufficient documentation

## 2019-09-18 DIAGNOSIS — Z7189 Other specified counseling: Secondary | ICD-10-CM

## 2019-09-18 DIAGNOSIS — R197 Diarrhea, unspecified: Secondary | ICD-10-CM

## 2019-09-18 DIAGNOSIS — K521 Toxic gastroenteritis and colitis: Secondary | ICD-10-CM

## 2019-09-18 DIAGNOSIS — Z79899 Other long term (current) drug therapy: Secondary | ICD-10-CM | POA: Insufficient documentation

## 2019-09-18 DIAGNOSIS — Z86711 Personal history of pulmonary embolism: Secondary | ICD-10-CM | POA: Insufficient documentation

## 2019-09-18 DIAGNOSIS — C3491 Malignant neoplasm of unspecified part of right bronchus or lung: Secondary | ICD-10-CM

## 2019-09-18 LAB — CBC WITH DIFFERENTIAL/PLATELET
Abs Immature Granulocytes: 0.02 10*3/uL (ref 0.00–0.07)
Basophils Absolute: 0.1 10*3/uL (ref 0.0–0.1)
Basophils Relative: 1 %
Eosinophils Absolute: 0.1 10*3/uL (ref 0.0–0.5)
Eosinophils Relative: 2 %
HCT: 35.2 % — ABNORMAL LOW (ref 36.0–46.0)
Hemoglobin: 11.3 g/dL — ABNORMAL LOW (ref 12.0–15.0)
Immature Granulocytes: 0 %
Lymphocytes Relative: 21 %
Lymphs Abs: 1 10*3/uL (ref 0.7–4.0)
MCH: 28.3 pg (ref 26.0–34.0)
MCHC: 32.1 g/dL (ref 30.0–36.0)
MCV: 88.2 fL (ref 80.0–100.0)
Monocytes Absolute: 0.5 10*3/uL (ref 0.1–1.0)
Monocytes Relative: 9 %
Neutro Abs: 3.3 10*3/uL (ref 1.7–7.7)
Neutrophils Relative %: 67 %
Platelets: 168 10*3/uL (ref 150–400)
RBC: 3.99 MIL/uL (ref 3.87–5.11)
RDW: 15.2 % (ref 11.5–15.5)
WBC: 4.9 10*3/uL (ref 4.0–10.5)
nRBC: 0 % (ref 0.0–0.2)

## 2019-09-18 LAB — CMP (CANCER CENTER ONLY)
ALT: 13 U/L (ref 0–44)
AST: 27 U/L (ref 15–41)
Albumin: 3.5 g/dL (ref 3.5–5.0)
Alkaline Phosphatase: 52 U/L (ref 38–126)
Anion gap: 8 (ref 5–15)
BUN: 18 mg/dL (ref 8–23)
CO2: 24 mmol/L (ref 22–32)
Calcium: 8.9 mg/dL (ref 8.9–10.3)
Chloride: 108 mmol/L (ref 98–111)
Creatinine: 1.18 mg/dL — ABNORMAL HIGH (ref 0.44–1.00)
GFR, Est AFR Am: 51 mL/min — ABNORMAL LOW
GFR, Estimated: 44 mL/min — ABNORMAL LOW
Glucose, Bld: 107 mg/dL — ABNORMAL HIGH (ref 70–99)
Potassium: 4.1 mmol/L (ref 3.5–5.1)
Sodium: 140 mmol/L (ref 135–145)
Total Bilirubin: 0.4 mg/dL (ref 0.3–1.2)
Total Protein: 7.6 g/dL (ref 6.5–8.1)

## 2019-09-18 LAB — FERRITIN: Ferritin: 6 ng/mL — ABNORMAL LOW (ref 11–307)

## 2019-09-18 MED ORDER — DENOSUMAB 120 MG/1.7ML ~~LOC~~ SOLN: 120.0000 mg | Freq: Once | SUBCUTANEOUS | Status: DC

## 2019-09-18 MED ORDER — SODIUM CHLORIDE 0.9% FLUSH
10.0000 mL | Freq: Once | INTRAVENOUS | Status: DC
  Filled 2019-09-18: qty 10

## 2019-09-18 MED ORDER — HEPARIN SOD (PORK) LOCK FLUSH 100 UNIT/ML IV SOLN
250.0000 [IU] | Freq: Once | INTRAVENOUS | Status: DC
  Filled 2019-09-18: qty 5

## 2019-09-18 MED ORDER — DENOSUMAB 120 MG/1.7ML ~~LOC~~ SOLN
SUBCUTANEOUS | Status: AC
  Filled 2019-09-18: qty 1.7

## 2019-09-18 MED ORDER — DENOSUMAB 120 MG/1.7ML ~~LOC~~ SOLN
120.0000 mg | Freq: Once | SUBCUTANEOUS | Status: AC
  Administered 2019-09-18: 11:00:00 120 mg via SUBCUTANEOUS

## 2019-09-18 MED ORDER — OCTREOTIDE ACETATE 30 MG IM KIT
PACK | INTRAMUSCULAR | Status: AC
  Filled 2019-09-18: qty 1

## 2019-09-18 MED ORDER — OCTREOTIDE ACETATE 30 MG IM KIT
30.0000 mg | PACK | Freq: Once | INTRAMUSCULAR | Status: AC
  Administered 2019-09-18: 30 mg via INTRAMUSCULAR

## 2019-09-18 NOTE — Patient Instructions (Signed)
Octreotide injection solution What is this medicine? OCTREOTIDE (ok TREE oh tide) is used to reduce blood levels of growth hormone in patients with a condition called acromegaly. This medicine also reduces flushing and watery diarrhea caused by certain types of cancer. This medicine may be used for other purposes; ask your health care provider or pharmacist if you have questions. COMMON BRAND NAME(S): Bynfezia, Sandostatin, Sandostatin LAR What should I tell my health care provider before I take this medicine? They need to know if you have any of these conditions:  diabetes  gallbladder disease  kidney disease  liver disease  an unusual or allergic reaction to octreotide, other medicines, foods, dyes, or preservatives  pregnant or trying to get pregnant  breast-feeding How should I use this medicine? This medicine is for injection under the skin or into a vein (only in emergency situations). It is usually given by a health care professional in a hospital or clinic setting. If you get this medicine at home, you will be taught how to prepare and give this medicine. Allow the injection solution to come to room temperature before use. Do not warm it artificially. Use exactly as directed. Take your medicine at regular intervals. Do not take your medicine more often than directed. It is important that you put your used needles and syringes in a special sharps container. Do not put them in a trash can. If you do not have a sharps container, call your pharmacist or healthcare provider to get one. Talk to your pediatrician regarding the use of this medicine in children. Special care may be needed. Overdosage: If you think you have taken too much of this medicine contact a poison control center or emergency room at once. NOTE: This medicine is only for you. Do not share this medicine with others. What if I miss a dose? If you miss a dose, take it as soon as you can. If it is almost time for your  next dose, take only that dose. Do not take double or extra doses. What may interact with this medicine? Do not take this medicine with any of the following medications:  cisapride  droperidol  general anesthetics  grepafloxacin  perphenazine  thioridazine This medicine may also interact with the following medications:  bromocriptine  cyclosporine  diuretics  medicines for blood pressure, heart disease, irregular heart beat  medicines for diabetes, including insulin  quinidine This list may not describe all possible interactions. Give your health care provider a list of all the medicines, herbs, non-prescription drugs, or dietary supplements you use. Also tell them if you smoke, drink alcohol, or use illegal drugs. Some items may interact with your medicine. What should I watch for while using this medicine? Visit your doctor or health care professional for regular checks on your progress. To help reduce irritation at the injection site, use a different site for each injection and make sure the solution is at room temperature before use. This medicine may cause decreases in blood sugar. Signs of low blood sugar include chills, cool, pale skin or cold sweats, drowsiness, extreme hunger, fast heartbeat, headache, nausea, nervousness or anxiety, shakiness, trembling, unsteadiness, tiredness, or weakness. Contact your doctor or health care professional right away if you experience any of these symptoms. This medicine may increase blood sugar. Ask your healthcare provider if changes in diet or medicines are needed if you have diabetes. This medicine may cause a decrease in vitamin B12. You should make sure that you get enough vitamin B12  while you are taking this medicine. Discuss the foods you eat and the vitamins you take with your health care professional. What side effects may I notice from receiving this medicine? Side effects that you should report to your doctor or health care  professional as soon as possible:  allergic reactions like skin rash, itching or hives, swelling of the face, lips, or tongue  decreases in blood sugar  changes in heart rate  severe stomach pain   signs and symptoms of high blood sugar such as being more thirsty or hungry or having to urinate more than normal. You may also feel very tired or have blurry vision. Side effects that usually do not require medical attention (report to your doctor or health care professional if they continue or are bothersome):  diarrhea or constipation  gas or stomach pain  nausea, vomiting  pain, redness, swelling and irritation at site where injected This list may not describe all possible side effects. Call your doctor for medical advice about side effects. You may report side effects to FDA at 1-800-FDA-1088. Where should I keep my medicine? Keep out of the reach of children. Store in a refrigerator between 2 and 8 degrees C (36 and 46 degrees F). Protect from light. Allow to come to room temperature naturally. Do not use artificial heat. If protected from light, the injection may be stored at room temperature between 20 and 30 degrees C (70 and 86 degrees F) for 14 days. After the initial use, throw away any unused portion of a multiple dose vial after 14 days. Throw away unused portions of the ampules after use. NOTE: This sheet is a summary. It may not cover all possible information. If you have questions about this medicine, talk to your doctor, pharmacist, or health care provider.  2020 [employer] (2018-08-30 08:07:09) Denosumab injection What is this medicine? DENOSUMAB (den oh sue mab) slows bone breakdown. Prolia is used to treat osteoporosis in women after menopause and in men, and in people who are taking corticosteroids for 6 months or more. Delton See is used to treat a high calcium level due to cancer and to prevent bone fractures and other bone problems caused by multiple myeloma or  cancer bone metastases. Delton See is also used to treat giant cell tumor of the bone. This medicine may be used for other purposes; ask your health care provider or pharmacist if you have questions. COMMON BRAND NAME(S): Prolia, XGEVA What should I tell my health care provider before I take this medicine? They need to know if you have any of these conditions:  dental disease  having surgery or tooth extraction  infection  kidney disease  low levels of calcium or Vitamin D in the blood  malnutrition  on hemodialysis  skin conditions or sensitivity  thyroid or parathyroid disease  an unusual reaction to denosumab, other medicines, foods, dyes, or preservatives  pregnant or trying to get pregnant  breast-feeding How should I use this medicine? This medicine is for injection under the skin. It is given by a health care professional in a hospital or clinic setting. A special MedGuide will be given to you before each treatment. Be sure to read this information carefully each time. For Prolia, talk to your pediatrician regarding the use of this medicine in children. Special care may be needed. For Delton See, talk to your pediatrician regarding the use of this medicine in children. While this drug may be prescribed for children as young as 13 years for selected  conditions, precautions do apply. Overdosage: If you think you have taken too much of this medicine contact a poison control center or emergency room at once. NOTE: This medicine is only for you. Do not share this medicine with others. What if I miss a dose? It is important not to miss your dose. Call your doctor or health care professional if you are unable to keep an appointment. What may interact with this medicine? Do not take this medicine with any of the following medications:  other medicines containing denosumab This medicine may also interact with the following medications:  medicines that lower your chance of fighting  infection  steroid medicines like prednisone or cortisone This list may not describe all possible interactions. Give your health care provider a list of all the medicines, herbs, non-prescription drugs, or dietary supplements you use. Also tell them if you smoke, drink alcohol, or use illegal drugs. Some items may interact with your medicine. What should I watch for while using this medicine? Visit your doctor or health care professional for regular checks on your progress. Your doctor or health care professional may order blood tests and other tests to see how you are doing. Call your doctor or health care professional for advice if you get a fever, chills or sore throat, or other symptoms of a cold or flu. Do not treat yourself. This drug may decrease your body's ability to fight infection. Try to avoid being around people who are sick. You should make sure you get enough calcium and vitamin D while you are taking this medicine, unless your doctor tells you not to. Discuss the foods you eat and the vitamins you take with your health care professional. See your dentist regularly. Brush and floss your teeth as directed. Before you have any dental work done, tell your dentist you are receiving this medicine. Do not become pregnant while taking this medicine or for 5 months after stopping it. Talk with your doctor or health care professional about your birth control options while taking this medicine. Women should inform their doctor if they wish to become pregnant or think they might be pregnant. There is a potential for serious side effects to an unborn child. Talk to your health care professional or pharmacist for more information. What side effects may I notice from receiving this medicine? Side effects that you should report to your doctor or health care professional as soon as possible:  allergic reactions like skin rash, itching or hives, swelling of the face, lips, or tongue  bone  pain  breathing problems  dizziness  jaw pain, especially after dental work  redness, blistering, peeling of the skin  signs and symptoms of infection like fever or chills; cough; sore throat; pain or trouble passing urine  signs of low calcium like fast heartbeat, muscle cramps or muscle pain; pain, tingling, numbness in the hands or feet; seizures  unusual bleeding or bruising  unusually weak or tired Side effects that usually do not require medical attention (report to your doctor or health care professional if they continue or are bothersome):  constipation  diarrhea  headache  joint pain  loss of appetite  muscle pain  runny nose  tiredness  upset stomach This list may not describe all possible side effects. Call your doctor for medical advice about side effects. You may report side effects to FDA at 1-800-FDA-1088. Where should I keep my medicine? This medicine is only given in a clinic, doctor's office, or other health care  setting and will not be stored at home. NOTE: This sheet is a summary. It may not cover all possible information. If you have questions about this medicine, talk to your doctor, pharmacist, or health care provider.  2020 [employer] (2018-03-30 16:10:44)

## 2019-09-24 ENCOUNTER — Telehealth: Payer: Self-pay | Admitting: *Deleted

## 2019-09-24 ENCOUNTER — Encounter: Payer: Self-pay | Admitting: Internal Medicine

## 2019-09-24 ENCOUNTER — Ambulatory Visit: Payer: Medicare Other | Admitting: Internal Medicine

## 2019-09-24 VITALS — BP 110/62 | HR 72 | Temp 98.2°F | Ht 68.5 in | Wt 134.0 lb

## 2019-09-24 DIAGNOSIS — R1319 Other dysphagia: Secondary | ICD-10-CM

## 2019-09-24 DIAGNOSIS — C159 Malignant neoplasm of esophagus, unspecified: Secondary | ICD-10-CM | POA: Diagnosis not present

## 2019-09-24 DIAGNOSIS — R933 Abnormal findings on diagnostic imaging of other parts of digestive tract: Secondary | ICD-10-CM | POA: Diagnosis not present

## 2019-09-24 DIAGNOSIS — R1310 Dysphagia, unspecified: Secondary | ICD-10-CM

## 2019-09-24 NOTE — Patient Instructions (Addendum)
Due to recent COVID-19 restrictions implemented by our local and state authorities and in an effort to keep both patients and staff as safe as possible, our hospital system now requires COVID-19 testing prior to any scheduled hospital procedure. Please go to our [facility] location drive thru testing site ([address], [place], [place] [address]) on Friday, 10/04/19 at  2:40 pm. There will be multiple testing areas, the first checkpoint being for pre-procedure/surgery testing. Get into the right (yellow) lane that leads to the PAT testing team. You will not be billed at the time of testing but may receive a bill later depending on your insurance. The approximate cost of the test is $100. You must agree to quarantine from the time of your testing until the procedure date on 10/08/19 . This should include staying at home with ONLY the people you live with. Avoid take-out, grocery store shopping or leaving the house for any non-emergent reason. Failure to have your COVID-19 test done on the date and time you have been scheduled will result in cancellation of procedure. Please call our office at [phone] if you have any questions.   You have been scheduled for an endoscopy. Please follow written instructions given to you at your visit today. If you use inhalers (even only as needed), please bring them with you on the day of your procedure. Your physician has requested that you go to www.startemmi.com and enter the access code given to you at your visit today. This web site gives a general overview about your procedure. However, you should still follow specific instructions given to you by our office regarding your preparation for the procedure.  You will be contaced by our office prior to your procedure for directions on holding your Xarelto.  If you do not hear from our office 1 week prior to your scheduled procedure, please call [phone] to discuss.  Continue omeprazole.  If you are age 71  or older, your body mass index should be between 23-30. Your Body mass index is 20.08 kg/m. If this is out of the aforementioned range listed, please consider follow up with your Primary Care Provider.  If you are age 72 or younger, your body mass index should be between 19-25. Your Body mass index is 20.08 kg/m. If this is out of the aformentioned range listed, please consider follow up with your Primary Care Provider.

## 2019-09-24 NOTE — Telephone Encounter (Signed)
   Cindy Byrd 1941/07/13 [id number]  Dear Dr Irene Limbo:  We have scheduled the above named patient for a endoscopy procedure. Our records show that (s)he is on anticoagulation therapy.  Please advise as to whether the patient may come off their therapy of xarelto 2 days prior to their procedure which is scheduled for 10-08-19.  Please route your response to Dixon Boos, [place] or fax response to [phone].  Sincerely,  [facility]

## 2019-09-24 NOTE — Progress Notes (Signed)
Subjective:    Patient ID: Cindy Byrd, female    DOB: 1941-11-23, 78 y.o.   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 difficile colitis, history of prior DVT and PE who is here for follow-up.  She is here alone today.  She has been following closely with Dr. Irene Limbo for her lung and esophagus cancer.  She was treated with radiation and chemotherapy for her esophageal cancer.  She is currently on active surveillance.  She receives Xgeva every 4 weeks for bone metastasis, Sandostatin every 4 weeks for diarrhea related to immune colitis.  She was treated for esophagus cancer with carboplatinum and Taxol last in April 2020.  She has developed slowly progressive recurrent issues with dysphagia.  She has to chew her food a long time and feels like food will stop in her mid chest.  Liquid seems to go down mostly okay.  She will occasionally have regurgitation of mucus with scant blood.  Bread is the hardest thing for her to eat.  She has not been able to gain any weight.  She denies abdominal pain today.  Reports diarrhea is under control.  We performed a barium esophagram recently which we reviewed together today.  This was performed on 09/11/2019.  I discussed this with Dr. Nevada Crane who read the study and also Dr. Ardis Hughs, my partner.  This study showed in the distal esophagus a 2 cm segment of dilatation suspected to correspond to an area of treated tumor.  There was mild narrowings immediately upstream and perhaps downstream from this treated segment.  Review of Systems As per HPI, otherwise negative  Current Medications, Allergies, Past Medical History, Past Surgical History, Family History and Social History were reviewed in [employer] record.     Objective:   Physical Exam BP 110/62   Pulse 72   Temp 98.2 F  (36.8 C)   Ht 5' 8.5" (1.74 m)   Wt 134 lb (60.8 kg)   BMI 20.08 kg/m  Gen: awake, alert, NAD HEENT: anicteric, op clear CV: RRR, no mrg Pulm: CTA b/l Abd: soft, NT/ND, +BS throughout Ext: no c/c/e Neuro: nonfocal  ESOPHOGRAM/BARIUM SWALLOW   TECHNIQUE: Combined double contrast and single contrast examination performed using effervescent crystals, thick barium liquid, and thin barium liquid.   FLUOROSCOPY TIME:  Fluoroscopy Time:  1 minutes 12 seconds   Radiation Exposure Index (if provided by the fluoroscopic device): 17.7 mGy   Number of Acquired Spot Images: 0   COMPARISON:  Repeat PET-CT 07/03/2019, and earlier.   FINDINGS: The study was initially undertaken with thin barium using single contrast technique.   The initial swallows of thin barium passed into the stomach without delay. On these, in the distal esophagus there is a roughly 2 centimeter long barrel shaped area of mild esophageal dilatation, which I suspect corresponds to the area of treated tumor.   Decision was then made to continue the exam with double contrast technique which the patient tolerated well and without difficulty.   With double contrast technique and thick barium a slower transit of barium occurred, but on series 4, image 25 there seems to be only mild esophageal narrowing both immediately upstream and downstream to the 2 centimeter patulous area.   The images thus far were reviewed with the patient.   Two additional swallows with thick barium with more  RAO positioning were then obtained.   On these last images, the maximal area of narrowing appears to be downstream of the tumor site at the gastroesophageal junction. See series 8, image 84.   IMPRESSION: 1. In the distal esophagus a 2 cm segment of mild esophageal dilatation is suspected to correspond to the area of treated tumor. 2. Relatively mild esophageal narrowing immediately upstream and perhaps to a greater extent  downstream. 3. Study discussed by telephone with Dr. Zenovia Jarred on 09/11/2019 at 11:42 .     Electronically Signed   By: Genevie Ann M.D.   On: 09/11/2019 11:43   CBC    Component Value Date/Time   WBC 4.9 09/18/2019 1027   RBC 3.99 09/18/2019 1027   HGB 11.3 (L) 09/18/2019 1027   HGB 10.9 (L) 09/04/2019 1343   HGB 11.6 04/16/2018 0839   HGB 11.5 (L) 11/16/2017 1422   HCT 35.2 (L) 09/18/2019 1027   HCT 37.3 04/16/2018 0839   HCT 37.1 11/16/2017 1422   PLT 168 09/18/2019 1027   PLT 181 09/04/2019 1343   PLT 225 04/16/2018 0839   MCV 88.2 09/18/2019 1027   MCV 90 04/16/2018 0839   MCV 90.9 11/16/2017 1422   MCH 28.3 09/18/2019 1027   MCHC 32.1 09/18/2019 1027   RDW 15.2 09/18/2019 1027   RDW 14.4 04/16/2018 0839   RDW 14.1 11/16/2017 1422   LYMPHSABS 1.0 09/18/2019 1027   LYMPHSABS 1.8 04/16/2018 0839   LYMPHSABS 1.6 11/16/2017 1422   MONOABS 0.5 09/18/2019 1027   MONOABS 0.3 11/16/2017 1422   EOSABS 0.1 09/18/2019 1027   EOSABS 0.2 04/16/2018 0839   BASOSABS 0.1 09/18/2019 1027   BASOSABS 0.1 04/16/2018 0839   BASOSABS 0.0 11/16/2017 1422   CMP     Component Value Date/Time   NA 140 09/18/2019 1027   NA 143 10/24/2018 1412   NA 141 11/16/2017 1422   K 4.1 09/18/2019 1027   K 3.6 11/16/2017 1422   CL 108 09/18/2019 1027   CO2 24 09/18/2019 1027   CO2 28 11/16/2017 1422   GLUCOSE 107 (H) 09/18/2019 1027   GLUCOSE 149 (H) 11/16/2017 1422   BUN 18 09/18/2019 1027   BUN 18 10/24/2018 1412   BUN 12.6 11/16/2017 1422   CREATININE 1.18 (H) 09/18/2019 1027   CREATININE 0.9 11/16/2017 1422   CALCIUM 8.9 09/18/2019 1027   CALCIUM 8.9 11/16/2017 1422   PROT 7.6 09/18/2019 1027   PROT 7.4 10/24/2018 1412   PROT 7.8 11/16/2017 1422   ALBUMIN 3.5 09/18/2019 1027   ALBUMIN 4.0 10/24/2018 1412   ALBUMIN 3.3 (L) 11/16/2017 1422   AST 27 09/18/2019 1027   AST 43 (H) 11/16/2017 1422   ALT 13 09/18/2019 1027   ALT 27 11/16/2017 1422   ALKPHOS 52 09/18/2019 1027    ALKPHOS 72 11/16/2017 1422   BILITOT 0.4 09/18/2019 1027   BILITOT 0.36 11/16/2017 1422   GFRNONAA 44 (L) 09/18/2019 1027   GFRAA 51 (L) 09/18/2019 1027         Assessment & Plan:  78 year old female with a history of esophageal adenocarcinoma, metastatic non-small cell lung cancer, 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 difficile colitis, history of prior DVT and PE who is here for follow-up.  1.  History of esophageal cancer/dysphagia --I have recommended upper endoscopy.  We need to determine whether this dysphagia is secondary to recurrent tumor in the esophagus or stricture related to prior treatment.  Radiation esophagitis  is also quite possible.  We discussed findings at endoscopy would dictate what could be done about her dysphagia.  Dilation may be able to be performed if this is radiation related stricture but would not help if there is tumor regrowth in the esophagus.  I also did speak to Dr. Ardis Hughs about esophageal stent and at this point esophageal stent would not clearly help.  This may change based on findings at endoscopy.  Also spoke to her about placement of a PEG tube which would help supplement her nutrition should her dysphagia become progressive.  She is open to considering this.  Finally we will need to hold her Xarelto 2 days before endoscopy.  I will perform the endoscopy in the outpatient hospital setting --EGD at [facility] long on 10/08/2019 --Continue omeprazole 40 mg daily --Consideration of PEG tube --Continue soft diet  Further recommendations after upper endoscopy.  Will hold Xarelto 2 days prior to endoscopic procedures - will instruct when and how to resume after procedure. Benefits and risks of procedure explained including risks of bleeding, perforation, infection, missed lesions, reactions to medications and possible need for hospitalization and surgery for complications. Additional rare but real risk of stroke or other vascular clotting  events off Xarelto also explained and need to seek urgent help if any signs of these problems occur. Will communicate by phone or EMR with patient's  prescribing provider to confirm that holding Xarelto is reasonable in this case.

## 2019-09-24 NOTE — H&P (View-Only) (Signed)
Subjective:    Patient ID: Cindy Byrd, female    DOB: 03-24-1941, 78 y.o.   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 difficile colitis, history of prior DVT and PE who is here for follow-up.  She is here alone today.  She has been following closely with Dr. Irene Limbo for her lung and esophagus cancer.  She was treated with radiation and chemotherapy for her esophageal cancer.  She is currently on active surveillance.  She receives Xgeva every 4 weeks for bone metastasis, Sandostatin every 4 weeks for diarrhea related to immune colitis.  She was treated for esophagus cancer with carboplatinum and Taxol last in April 2020.  She has developed slowly progressive recurrent issues with dysphagia.  She has to chew her food a long time and feels like food will stop in her mid chest.  Liquid seems to go down mostly okay.  She will occasionally have regurgitation of mucus with scant blood.  Bread is the hardest thing for her to eat.  She has not been able to gain any weight.  She denies abdominal pain today.  Reports diarrhea is under control.  We performed a barium esophagram recently which we reviewed together today.  This was performed on 09/11/2019.  I discussed this with Dr. Nevada Crane who read the study and also Dr. Ardis Hughs, my partner.  This study showed in the distal esophagus a 2 cm segment of dilatation suspected to correspond to an area of treated tumor.  There was mild narrowings immediately upstream and perhaps downstream from this treated segment.  Review of Systems As per HPI, otherwise negative  Current Medications, Allergies, Past Medical History, Past Surgical History, Family History and Social History were reviewed in [employer] record.     Objective:   Physical Exam BP 110/62   Pulse 72   Temp 98.2 F  (36.8 C)   Ht 5' 8.5" (1.74 m)   Wt 134 lb (60.8 kg)   BMI 20.08 kg/m  Gen: awake, alert, NAD HEENT: anicteric, op clear CV: RRR, no mrg Pulm: CTA b/l Abd: soft, NT/ND, +BS throughout Ext: no c/c/e Neuro: nonfocal  ESOPHOGRAM/BARIUM SWALLOW   TECHNIQUE: Combined double contrast and single contrast examination performed using effervescent crystals, thick barium liquid, and thin barium liquid.   FLUOROSCOPY TIME:  Fluoroscopy Time:  1 minutes 12 seconds   Radiation Exposure Index (if provided by the fluoroscopic device): 17.7 mGy   Number of Acquired Spot Images: 0   COMPARISON:  Repeat PET-CT 07/03/2019, and earlier.   FINDINGS: The study was initially undertaken with thin barium using single contrast technique.   The initial swallows of thin barium passed into the stomach without delay. On these, in the distal esophagus there is a roughly 2 centimeter long barrel shaped area of mild esophageal dilatation, which I suspect corresponds to the area of treated tumor.   Decision was then made to continue the exam with double contrast technique which the patient tolerated well and without difficulty.   With double contrast technique and thick barium a slower transit of barium occurred, but on series 4, image 25 there seems to be only mild esophageal narrowing both immediately upstream and downstream to the 2 centimeter patulous area.   The images thus far were reviewed with the patient.   Two additional swallows with thick barium with more  RAO positioning were then obtained.   On these last images, the maximal area of narrowing appears to be downstream of the tumor site at the gastroesophageal junction. See series 8, image 84.   IMPRESSION: 1. In the distal esophagus a 2 cm segment of mild esophageal dilatation is suspected to correspond to the area of treated tumor. 2. Relatively mild esophageal narrowing immediately upstream and perhaps to a greater extent  downstream. 3. Study discussed by telephone with Dr. Zenovia Jarred on 09/11/2019 at 11:42 .     Electronically Signed   By: Genevie Ann M.D.   On: 09/11/2019 11:43   CBC    Component Value Date/Time   WBC 4.9 09/18/2019 1027   RBC 3.99 09/18/2019 1027   HGB 11.3 (L) 09/18/2019 1027   HGB 10.9 (L) 09/04/2019 1343   HGB 11.6 04/16/2018 0839   HGB 11.5 (L) 11/16/2017 1422   HCT 35.2 (L) 09/18/2019 1027   HCT 37.3 04/16/2018 0839   HCT 37.1 11/16/2017 1422   PLT 168 09/18/2019 1027   PLT 181 09/04/2019 1343   PLT 225 04/16/2018 0839   MCV 88.2 09/18/2019 1027   MCV 90 04/16/2018 0839   MCV 90.9 11/16/2017 1422   MCH 28.3 09/18/2019 1027   MCHC 32.1 09/18/2019 1027   RDW 15.2 09/18/2019 1027   RDW 14.4 04/16/2018 0839   RDW 14.1 11/16/2017 1422   LYMPHSABS 1.0 09/18/2019 1027   LYMPHSABS 1.8 04/16/2018 0839   LYMPHSABS 1.6 11/16/2017 1422   MONOABS 0.5 09/18/2019 1027   MONOABS 0.3 11/16/2017 1422   EOSABS 0.1 09/18/2019 1027   EOSABS 0.2 04/16/2018 0839   BASOSABS 0.1 09/18/2019 1027   BASOSABS 0.1 04/16/2018 0839   BASOSABS 0.0 11/16/2017 1422   CMP     Component Value Date/Time   NA 140 09/18/2019 1027   NA 143 10/24/2018 1412   NA 141 11/16/2017 1422   K 4.1 09/18/2019 1027   K 3.6 11/16/2017 1422   CL 108 09/18/2019 1027   CO2 24 09/18/2019 1027   CO2 28 11/16/2017 1422   GLUCOSE 107 (H) 09/18/2019 1027   GLUCOSE 149 (H) 11/16/2017 1422   BUN 18 09/18/2019 1027   BUN 18 10/24/2018 1412   BUN 12.6 11/16/2017 1422   CREATININE 1.18 (H) 09/18/2019 1027   CREATININE 0.9 11/16/2017 1422   CALCIUM 8.9 09/18/2019 1027   CALCIUM 8.9 11/16/2017 1422   PROT 7.6 09/18/2019 1027   PROT 7.4 10/24/2018 1412   PROT 7.8 11/16/2017 1422   ALBUMIN 3.5 09/18/2019 1027   ALBUMIN 4.0 10/24/2018 1412   ALBUMIN 3.3 (L) 11/16/2017 1422   AST 27 09/18/2019 1027   AST 43 (H) 11/16/2017 1422   ALT 13 09/18/2019 1027   ALT 27 11/16/2017 1422   ALKPHOS 52 09/18/2019 1027    ALKPHOS 72 11/16/2017 1422   BILITOT 0.4 09/18/2019 1027   BILITOT 0.36 11/16/2017 1422   GFRNONAA 44 (L) 09/18/2019 1027   GFRAA 51 (L) 09/18/2019 1027         Assessment & Plan:  78 year old female with a history of esophageal adenocarcinoma, metastatic non-small cell lung cancer, 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 difficile colitis, history of prior DVT and PE who is here for follow-up.  1.  History of esophageal cancer/dysphagia --I have recommended upper endoscopy.  We need to determine whether this dysphagia is secondary to recurrent tumor in the esophagus or stricture related to prior treatment.  Radiation esophagitis  is also quite possible.  We discussed findings at endoscopy would dictate what could be done about her dysphagia.  Dilation may be able to be performed if this is radiation related stricture but would not help if there is tumor regrowth in the esophagus.  I also did speak to Dr. Ardis Hughs about esophageal stent and at this point esophageal stent would not clearly help.  This may change based on findings at endoscopy.  Also spoke to her about placement of a PEG tube which would help supplement her nutrition should her dysphagia become progressive.  She is open to considering this.  Finally we will need to hold her Xarelto 2 days before endoscopy.  I will perform the endoscopy in the outpatient hospital setting --EGD at [facility] long on 10/08/2019 --Continue omeprazole 40 mg daily --Consideration of PEG tube --Continue soft diet  Further recommendations after upper endoscopy.  Will hold Xarelto 2 days prior to endoscopic procedures - will instruct when and how to resume after procedure. Benefits and risks of procedure explained including risks of bleeding, perforation, infection, missed lesions, reactions to medications and possible need for hospitalization and surgery for complications. Additional rare but real risk of stroke or other vascular clotting  events off Xarelto also explained and need to seek urgent help if any signs of these problems occur. Will communicate by phone or EMR with patient's  prescribing provider to confirm that holding Xarelto is reasonable in this case.

## 2019-09-25 ENCOUNTER — Ambulatory Visit (HOSPITAL_COMMUNITY): Payer: Medicare Other

## 2019-09-27 ENCOUNTER — Ambulatory Visit (HOSPITAL_COMMUNITY)
Admission: RE | Admit: 2019-09-27 | Discharge: 2019-09-27 | Disposition: A | Discharge status: Home / Self Care | Payer: Medicare Other | Source: Ambulatory Visit | Attending: Hematology | Admitting: Hematology

## 2019-09-27 ENCOUNTER — Other Ambulatory Visit: Payer: Self-pay

## 2019-09-27 ENCOUNTER — Encounter (HOSPITAL_COMMUNITY): Payer: Self-pay

## 2019-09-27 DIAGNOSIS — C3490 Malignant neoplasm of unspecified part of unspecified bronchus or lung: Secondary | ICD-10-CM | POA: Diagnosis not present

## 2019-09-27 DIAGNOSIS — C155 Malignant neoplasm of lower third of esophagus: Secondary | ICD-10-CM | POA: Insufficient documentation

## 2019-09-27 DIAGNOSIS — C159 Malignant neoplasm of esophagus, unspecified: Secondary | ICD-10-CM | POA: Diagnosis not present

## 2019-09-27 MED ORDER — SODIUM CHLORIDE (PF) 0.9 % IJ SOLN
INTRAMUSCULAR | Status: AC
  Filled 2019-09-27: qty 50

## 2019-09-27 MED ORDER — IOHEXOL 300 MG/ML  SOLN
60.0000 mL | Freq: Once | INTRAMUSCULAR | Status: AC | PRN
  Administered 2019-09-27: 12:00:00 60 mL via INTRAVENOUS

## 2019-10-01 NOTE — Progress Notes (Signed)
HEMATOLOGY ONCOLOGY PROGRESS NOTE  Date of service:  10/02/19    Patient Care Team: [name], NP as PCP - General (Family Medicine) [name], MD as Consulting Physician (Hematology and Oncology)  CHIEF COMPLAINTS/PURPOSE OF CONSULTATION:  F/u for metastatic lung cancer  DIAGNOSIS:   #1 Metastatic non-small cell lung cancer with bilateral lung nodules and large metastatic lesion in the left Ilium. 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 Also had c diff colitis - treated   Current Treatment  1) Active surveillance 2) Xgeva 160m [place] q4weeks for bone metastases. 3) Sandostatin q4weeks for diarrhea - immune colitis  Previous Treatment  1 Palliative radiation therapy to the large left ilium metastases 2. IV Nivolumab x 20ycles (discontinued due to likely immune colitis) 3. Xgeva 1261mSC q4weeks for bone metastases.   HISTORY OF PRESENTING ILLNESS: (plz see my previous consultation for details of initial presentation)  INTERVAL HISTORY:   Ms ToYerians presenting today for her scheduled follow-up for metastatic lung cancer, and adenocarcinoma of the esophagus. The patient's last visit with [place] on 09/04/2019. The pt reports that she is doing well overall.  The pt reports that she saw Dr. JaZenovia Jarrednd will have an EGD on 11/03. Pt notes that her swallowing is getting worse creating issues eating and drinking. She is having to chew a lot and it takes her a long time to eat anything. Pt is not currently using any liquid supplements like Boost and is not eating as much as before. Pt notes a lot of fatigue but denies any cravings for ice. She is open to having a feeding tube to maintain her nutritional status if necessary. Her breathing is okay unless she has bending over. Pt notes that Sandostatin has really helped her with her diarrhea.   Of note since the patient's last visit, pt has had CT Chest  ([id number] on 09/27/2019 with results revealing "1. Nonspecific mild circumferential wall thickening in the lower third of the thoracic esophagus extending to the esophagogastric junction, not convincingly changed since 07/03/2019 PET-CT, potentially representing post treatment change with residual tumor not excluded. 2. No recurrent mediastinal adenopathy or other findings of metastatic disease in the chest. 3. Increasing paramediastinal reticulation and ground-glass opacity in the lower lobes, favor evolving post treatment change. Aortic Atherosclerosis (ICD10-I70.0) and Emphysema (ICD10-J43.9)."  Lab results today (09/1419) of CBC w/diff and CMP is as follows: all values are WNL except for Hgb at 11.3, HCT at 35.2, Glucose at 107, Creatinine at 1.18, GFR Est Non Af Am at 44. 09/18/2019 Ferritin at 6  On review of systems, pt reports fatigue, worsening swallowing and denies SOB, ice cravings, new bone pains, leg swelling, abdominal pain, diarrhea, constipation, bloody/black stools and any other symptoms.   MEDICAL HISTORY:  Past Medical History:  Diagnosis Date  . Barrett's esophagus   . Bone neoplasm 06/24/2015  [facility]   metastatic poorly differentiated carcinoma. tumor left groin surgical removal with radiation tx.  . Cataract    BILATERAL  . Cigarette smoker two packs a day or less    Currently still smoking 2 PPD - Not interested in quitting at this time.  . Colitis 2017  . Colon polyps    hyperplastic, tubular adenomas, tubulovillous adenoma  . Cough, persistent    hx. lung cancer ? primary-being evaluated, unsure of primary site.  . Depression 06/24/2015  . Diverticulosis   .  Emphysema of lung ([place])   . Endometriosis    Hysterectomy with BSO at age 18 yrs  . Esophageal adenocarcinoma ([place]) 08/11/15   intramucosal  . Gastritis   . GERD (gastroesophageal reflux disease)   . H/O: pneumonia   . Hiatal hernia   . Hyperlipidemia   . Hypertension 06/24/2015    likely improved incidental to 40 lbs weight loss from her neoplasm. No Longer taking med for this as of 08-06-15  . IBS (irritable bowel syndrome)   . Pain    left hip-persistent"tumor of bone"-radiation tx. 10.  . Vitamin D deficiency disease    SURGICAL HISTORY: Past Surgical History:  Procedure Laterality Date  . BARTHOLIN GLAND CYST EXCISION  78 yo ago   Does not want if it was an infected cyst or tumor. Was soon as delivery  . BIOPSY  01/02/2019   Procedure: BIOPSY;  Surgeon: [name], MD;  Location: [facility];  Service: Gastroenterology;;  . CATARACT EXTRACTION    . COLONOSCOPY W/ POLYPECTOMY     multiple times - last done 09/2014 per patient.  . ESOPHAGOGASTRODUODENOSCOPY (EGD) WITH PROPOFOL N/A 08/11/2015   Procedure: ESOPHAGOGASTRODUODENOSCOPY (EGD) WITH PROPOFOL;  Surgeon: [name], MD;  Location: [facility];  Service: Gastroenterology;  Laterality: N/A;  . ESOPHAGOGASTRODUODENOSCOPY (EGD) WITH PROPOFOL N/A 01/02/2019   Procedure: ESOPHAGOGASTRODUODENOSCOPY (EGD) WITH PROPOFOL;  Surgeon: [name], MD;  Location: [facility];  Service: Gastroenterology;  Laterality: N/A;  . FLEXIBLE SIGMOIDOSCOPY N/A 06/24/2017   Procedure: FLEXIBLE SIGMOIDOSCOPY;  Surgeon: Manus Gunning, MD;  Location: [facility];  Service: Gastroenterology;  Laterality: N/A;  . GANGLION CYST EXCISION    . KNEE ARTHROSCOPY  age about 50 yrs  . TONSILLECTOMY    . TOTAL ABDOMINAL HYSTERECTOMY W/ BILATERAL SALPINGOOPHORECTOMY  at age 64 yrs   For endometriosis    SOCIAL HISTORY: Social History   Socioeconomic History  . Marital status: Widowed    Spouse name: Not on file  . Number of children: 2  . Years of education: Not on file  . Highest education level: Not on file  Occupational History  . Not on file  Social Needs  . Financial resource strain: Not on file  . Food insecurity    Worry: Not on file    Inability: Not on file  . Transportation needs    Medical: No     Non-medical: No  Tobacco Use  . Smoking status: Former Smoker    Packs/day: 1.00    Years: 60.00    Pack years: 60.00    Types: Cigarettes    Quit date: 12/05/2014    Years since quitting: 4.8  . Smokeless tobacco: Never Used  Substance and Sexual Activity  . Alcohol use: No    Alcohol/week: 0.0 standard drinks  . Drug use: No  . Sexual activity: Not Currently  Lifestyle  . Physical activity    Days per week: Not on file    Minutes per session: Not on file  . Stress: Not on file  Relationships  . Social Herbalist on phone: Not on file    Gets together: Not on file    Attends religious service: Not on file    Active member of club or organization: Not on file    Attends meetings of clubs or organizations: Not on file    Relationship status: Not on file  . Intimate partner violence    Fear of current or ex partner: Not on  file    Emotionally abused: Not on file    Physically abused: Not on file    Forced sexual activity: Not on file  Other Topics Concern  . Not on file  Social History Narrative  . Not on file    FAMILY HISTORY: Family History  Problem Relation Age of Onset  . Colon cancer Brother   . Colon cancer Brother   . Stroke Mother   . Colon cancer Father   . Emphysema Father        smoked  . Breast cancer Daughter 74       ER/PR+ stage II    ALLERGIES:  is allergic to penicillins; remeron [mirtazapine]; and latex. patient wonders if she has a penicillin allergy but notes that she is uncertain about this.  MEDICATIONS:  Current Outpatient Medications  Medication Sig Dispense Refill  . Biotin (BIOTIN 5000) 5 MG CAPS Take 5 mg by mouth daily.    . Calcium Citrate-Vitamin D (CALCIUM + D PO) Take 1 tablet by mouth daily.    . citalopram (CELEXA) 20 MG tablet Take 20 mg by mouth daily.    . Cyanocobalamin (B-12) 2500 MCG TABS Take 2,500 mcg by mouth daily.    [name] FLUoxetine (PROZAC) 20 MG capsule Take 1 capsule (20 mg total) by mouth daily. 90  capsule 3  . ibuprofen (ADVIL) 200 MG tablet Take 200 mg by mouth every 6 (six) hours as needed for headache or moderate pain.    [name] lidocaine-prilocaine (EMLA) cream Apply to affected area once (Patient taking differently: Apply 1 application topically as needed (port access). ) 30 g 3  . omeprazole (PRILOSEC) 40 MG capsule TAKE 1 CAPSULE BY MOUTH EVERY DAY (Patient taking differently: Take 40 mg by mouth daily. ) 90 capsule 3  . traZODone (DESYREL) 50 MG tablet TAKE 1 TABLET BY MOUTH EVERYDAY AT BEDTIME (Patient taking differently: Take 50 mg by mouth at bedtime. ) 90 tablet 2  . Vitamin D, Ergocalciferol, (DRISDOL) 1.25 MG (50000 UT) CAPS capsule TAKE 1 CAPSULE BY MOUTH EVERY 7 DAYS (Patient taking differently: Take 50,000 Units by mouth every Wednesday. ) 12 capsule 3  . XARELTO 20 MG TABS tablet TAKE 1 TABLET (20 MG TOTAL) BY MOUTH DAILY WITH SUPPER. (Patient taking differently: Take 20 mg by mouth daily with supper. ) 90 tablet 0   No current facility-administered medications for this visit.     REVIEW OF SYSTEMS:   A 10+ POINT REVIEW OF SYSTEMS WAS OBTAINED including neurology, dermatology, psychiatry, cardiac, respiratory, lymph, extremities, GI, GU, Musculoskeletal, constitutional, breasts, reproductive, HEENT.  All pertinent positives are noted in the HPI.  All others are negative.   PHYSICAL EXAMINATION: ECOG PERFORMANCE STATUS: 2 - Symptomatic, <50% confined to bed  Vitals:   10/02/19 1457  BP: (!) 153/60  Pulse: (!) 56  Resp: 18  Temp: 98.3 F (36.8 C)  SpO2: 99%   Filed Weights   10/02/19 1457  Weight: 134 lb 11.2 oz (61.1 kg)  .  Wt Readings from Last 3 Encounters:  10/02/19 134 lb 11.2 oz (61.1 kg)  09/24/19 134 lb (60.8 kg)  09/04/19 133 lb 14.4 oz (60.7 kg)   Exam was given in a chair   GENERAL:alert, in no acute distress and comfortable SKIN: no acute rashes, no significant lesions EYES: conjunctiva are pink and non-injected, sclera anicteric OROPHARYNX:  MMM, no exudates, no oropharyngeal erythema or ulceration NECK: supple, no JVD LYMPH:  no palpable lymphadenopathy in the cervical, axillary  or inguinal regions LUNGS: clear to auscultation b/l with normal respiratory effort HEART: regular rate & rhythm ABDOMEN:  normoactive bowel sounds , non tender, not distended. No palpable hepatosplenomegaly.  Extremity: no pedal edema PSYCH: alert & oriented x 3 with fluent speech NEURO: no focal motor/sensory deficits  LABORATORY DATA:  I have reviewed the data as listed  . CBC Latest Ref Rng & Units 09/18/2019 09/04/2019 08/21/2019  WBC 4.0 - 10.5 K/uL 4.9 5.5 5.5  Hemoglobin 12.0 - 15.0 g/dL 11.3(L) 10.9(L) 12.1  Hematocrit 36.0 - 46.0 % 35.2(L) 34.4(L) 38.5  Platelets 150 - 400 K/uL 168 181 169   ANC 1.8k . CMP Latest Ref Rng & Units 09/18/2019 09/04/2019 08/21/2019  Glucose 70 - 99 mg/dL 107(H) 82 96  BUN 8 - 23 mg/dL 18 16 20   Creatinine 0.44 - 1.00 mg/dL 1.18(H) 1.12(H) 1.06(H)  Sodium 135 - 145 mmol/L 140 141 141  Potassium 3.5 - 5.1 mmol/L 4.1 4.5 4.8  Chloride 98 - 111 mmol/L 108 113(H) 108  CO2 22 - 32 mmol/L 24 20(L) 26  Calcium 8.9 - 10.3 mg/dL 8.9 9.5 10.1  Total Protein 6.5 - 8.1 g/dL 7.6 7.5 7.8  Total Bilirubin 0.3 - 1.2 mg/dL 0.4 0.4 0.4  Alkaline Phos 38 - 126 U/L 52 53 55  AST 15 - 41 U/L 27 23 25   ALT 0 - 44 U/L 13 9 10        01/02/19 Esophagus Biopsy:    RADIOGRAPHIC STUDIES:  .Ct Chest W Contrast  Result Date: 09/27/2019 CLINICAL DATA:  Stage IV lung cancer and esophageal adenocarcinoma status post chemoradiation. Active surveillance. New dysphagia. EXAM: CT CHEST WITH CONTRAST TECHNIQUE: Multidetector CT imaging of the chest was performed during intravenous contrast administration. CONTRAST:  9m OMNIPAQUE IOHEXOL 300 MG/ML  SOLN COMPARISON:  07/03/2019 PET-CT.  02/25/2019 chest CT angiogram. FINDINGS: Cardiovascular: Normal heart size. No significant pericardial effusion/thickening. Right internal jugular  Port-A-Cath terminates in the lower third of the SVC. Left anterior descending coronary atherosclerosis. Atherosclerotic nonaneurysmal thoracic aorta. Top-normal caliber main pulmonary artery (3.1 cm diameter), stable. No central pulmonary emboli. Mediastinum/Nodes: No discrete thyroid nodules. Mild circumferential wall thickening in the lower third of the thoracic esophagus extending to the esophagogastric junction, not convincingly changed since 07/03/2019 PET-CT. No pathologically enlarged axillary, mediastinal or hilar lymph nodes. Lungs/Pleura: No pneumothorax. No pleural effusion. Moderate centrilobular emphysema with mild diffuse bronchial wall thickening. Mildly increased patchy reticulation and ground-glass opacity in the paramediastinal lower lobes, right greater than left. No acute consolidative airspace disease, lung masses or significant pulmonary nodules. Upper abdomen: No acute abnormality. Musculoskeletal: No aggressive appearing focal osseous lesions. Mild thoracic spondylosis. IMPRESSION: 1. Nonspecific mild circumferential wall thickening in the lower third of the thoracic esophagus extending to the esophagogastric junction, not convincingly changed since 07/03/2019 PET-CT, potentially representing post treatment change with residual tumor not excluded. 2. No recurrent mediastinal adenopathy or other findings of metastatic disease in the chest. 3. Increasing paramediastinal reticulation and ground-glass opacity in the lower lobes, favor evolving post treatment change. Aortic Atherosclerosis (ICD10-I70.0) and Emphysema (ICD10-J43.9). Electronically Signed   By: JIlona SorrelM.D.   On: 09/27/2019 12:53   Dg Esophagus W Double Cm (hd)  Result Date: 09/11/2019 CLINICAL DATA:  78year old female with treated esophageal cancer. Distal esophageal primary as demonstrated on 01/25/2019 PET-CT. Dysphagia, being considered for esophageal dilatation or stenting. EXAM: ESOPHOGRAM/BARIUM SWALLOW TECHNIQUE:  Combined double contrast and single contrast examination performed using effervescent crystals, thick barium liquid, and thin barium  liquid. FLUOROSCOPY TIME:  Fluoroscopy Time:  1 minutes 12 seconds Radiation Exposure Index (if provided by the fluoroscopic device): 17.7 mGy Number of Acquired Spot Images: 0 COMPARISON:  Repeat PET-CT 07/03/2019, and earlier. FINDINGS: The study was initially undertaken with thin barium using single contrast technique. The initial swallows of thin barium passed into the stomach without delay. On these, in the distal esophagus there is a roughly 2 centimeter long barrel shaped area of mild esophageal dilatation, which I suspect corresponds to the area of treated tumor. Decision was then made to continue the exam with double contrast technique which the patient tolerated well and without difficulty. With double contrast technique and thick barium a slower transit of barium occurred, but on series 4, image 25 there seems to be only mild esophageal narrowing both immediately upstream and downstream to the 2 centimeter patulous area. The images thus far were reviewed with the patient. Two additional swallows with thick barium with more RAO positioning were then obtained. On these last images, the maximal area of narrowing appears to be downstream of the tumor site at the gastroesophageal junction. See series 8, image 84. IMPRESSION: 1. In the distal esophagus a 2 cm segment of mild esophageal dilatation is suspected to correspond to the area of treated tumor. 2. Relatively mild esophageal narrowing immediately upstream and perhaps to a greater extent downstream. 3. Study discussed by telephone with Dr. Zenovia Jarred on 09/11/2019 at 11:42 . Electronically Signed   By: Genevie Ann M.D.   On: 09/11/2019 11:43    ASSESSMENT & PLAN:   78 y.o. female with  #1 Metastatic poorly differentiated carcinoma with likely lung primary non-small cell lung cancer.   CT of the head with and without  contrast showed no evidence of metastatic disease. EGFR blood test mutation analysis negative. CT chest abdomen pelvis 04/19/2016 shows no evidence of disease progression. Patient tolerated Nivolumab very well but was discontinued when she developed grade 2 Immune colitis. Has been off Nivolumab for >6 months  CT chest abdomen pelvis on 06/24/2016 shows no evidence of new disease or progression of metastatic disease. CT chest abdomen pelvis 09/06/2016 shows 1. Mixed interval response to therapy. 2. There is a new left ventral chest wall lesion deep to the pectoralis musculature worrisome for metastatic disease. 3. Posterior lower lobe nodular densities are identified which may reflect areas of pulmonary metastasis. 4. Interval decrease in size of destructive lesion involving the left iliac bone.  CT chest abd pelvis 12/08/2016: Cystic mass involving the left ventral chest wall has resolved in the interval. Likely was a hematoma due to trauma. Interval increase in size of pleural base mass overlying the posterior and inferior left lower lobe. There is also a new left pleural effusion identified.  CT chest 02/01/2017: Residual irregular soft tissue thickening/volume loss and trace left pleural fluid at the base of the left hemithorax, overall improved in appearance from 12/08/2016. No measurable lesion.  CT chest 05/29/2017 shows no residual pleural based mass or significant pleural effusion in the left hemithorax. No evidence of thoracic metastatic disease. No evidence of progressive metastatic disease within the abdomen or pelvis. Mixed lytic and blastic lesion involving the left iliac bone and associated pathologic fracture are unchanged.   CT CAP 09/14/17 shows no new changes. She does have slight displacement of her fractured left iliac bone. Evidence of stable disease.   CT CAP 01/04/2018- No new or progressive metastatic disease. Stable large left iliac bone metastasis with associated chronic pathologic  fracture.   CT chest/abd/pelvis done on 04/26/18 revealed Stable exam.  No new or progressive interval findings.  07/19/18 CT C/A/P revealed Stable exam.  No new or progressive interval findings. Large destructive left iliac lesion is similar to prior. Aortic Atherosclerosis and Emphysema.    11/06/18 CT C/A/P revealed Similar appearance of large mixed lytic and sclerotic lesion in the left ilium. No new metastatic lesions are otherwise noted elsewhere in the chest, abdomen or pelvis. 2. Interval development of thickening of the distal third of the esophagus. This is nonspecific, and could be related to underlying reflux esophagitis. However, if there is any clinical concern for Barrett's metaplasia or esophageal neoplasia, further evaluation with nonemergent endoscopy could be considered. 3. Aortic atherosclerosis, in addition to left main coronary artery disease. Assessment for potential risk factor modification, dietary therapy or pharmacologic therapy may be warranted, if clinically Indicated. 4. Diffuse bronchial wall thickening with mild to moderate centrilobular and paraseptal emphysema; imaging findings suggestive of underlying COPD. 5. Additional incidental findings, as above.   #2 Newly diagnosed Adenocarcinoma of the esophagus  Barrett's esophagus 4cms in the distal esophagus with low and high-grade dysplasia  01/02/19 Surgical pathology revealed adenocarcinoma of the esophagus   01/25/19 PET/CT revealed Distal esophageal primary, without hypermetabolic metastatic disease. 2. Chronic left iliac metastasis, as before. 3. Hypermetabolism within and superficial to the right gluteal musculature is most likely related to trauma and/or injection sites. 4. Aortic atherosclerosis, coronary artery atherosclerosis and emphysema.  S/p concurrent Carboplatin and Taxol weekly with RT of 45 Gy in 25 fractions and 5.4 Gy boost, completed between 02/04/19 and 03/27/19  #3 diarrhea-  now resolved was previously.  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 She was previously on on Lialda, budesonide,probiotics and lomotil but not currently taking any of these. Plan -Continue Sandostatin every 4 weeks   #4 h/o diverticulitis and c diff colitis - now resolved Plan  -continue on sandostatin today and q4weeks.  -continue on Lialda  #5 DVT and PE  -continue on Xarelto - no issues with bleeding   #6 Dsypnea 03/14/19 ECHO revealed LV EF of 60-65% 03/06/19 CXR revealed clear lung fields, normal heart size 03/07/19 and 02/25/19 EKGs, no overt concern but some decreased QRS amplitude Did refer to pulmonology for further evaluation and lung function testing  Began steroid inhaler to mitigate possible inflammation in airway, could be some radiation related scarring and emphysema  07/03/2019 PET skull base to thigh revealed "1. Interval response to therapy. Significant reduction in FDG uptake associated with distal esophageal mass. SUV max currently 2.61 versus 16.97 previously. 2. 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 Unchanged 3. Aortic Atherosclerosis (ICD10-I70.0) and Emphysema (ICD10-J43.9). Coronary artery calcifications."  PLAN: -Discussed pt labwork today, 09/18/19; blood counts are stable, blood chemistries are normal  -Discussed 09/18/2019 Ferritin is very low at 6 -Will continue monthly Sandostatin -Continue with Xgeva every 8 weeks, no dental concerns at this time -Continue PO Magnesium replacement  -Continue 88m Marinol BID -Continue 2000 units Vitamin D every day, with dose adjustment to maintain 25OH Vit D levels of 40-60 -Continue Trazodone for sleep difficulty -Will give IV Injectafer weekly x2 in 2 weeks - difficult for pt to take po iron with dysphagia -Pt scheduled for EGD on 11/03 with Dr. PHilarie Fredrickson -Discussed that esophageal narrowing could be due to  infection related to raditaiton, scar  tissue from radiation or a tumor  -Will see back in 2 weeks with labs     FOLLOW UP: -continue Sandostatin q4weeks plz schedule next 4 doses -change Xgeva to every 8 weeks - plz schedule next 3 doses. -RTC with Dr Irene Limbo 11/13/2019 with labs/portflush and injection appointment  The total time spent in the appt was 25 minutes and more than 50% was on counseling and direct patient cares.  All of the patient's questions were answered with apparent satisfaction. The patient knows to call the clinic with any problems, questions or concerns.  Sullivan Lone MD [place] [facility]  (Office): [phone] (Work cell): [phone] (Fax): [phone]  I, Yevette Edwards, am acting as a scribe for Dr. Sullivan Lone.   .I have reviewed the above documentation for accuracy and completeness, and I agree with the above. [name] MD

## 2019-10-02 ENCOUNTER — Other Ambulatory Visit: Payer: Self-pay

## 2019-10-02 ENCOUNTER — Inpatient Hospital Stay (HOSPITAL_BASED_OUTPATIENT_CLINIC_OR_DEPARTMENT_OTHER): Payer: Medicare Other | Admitting: Hematology

## 2019-10-02 VITALS — BP 153/60 | HR 56 | Temp 98.3°F | Resp 18 | Ht 68.5 in | Wt 134.7 lb

## 2019-10-02 DIAGNOSIS — C3491 Malignant neoplasm of unspecified part of right bronchus or lung: Secondary | ICD-10-CM | POA: Diagnosis not present

## 2019-10-02 DIAGNOSIS — R197 Diarrhea, unspecified: Secondary | ICD-10-CM | POA: Diagnosis not present

## 2019-10-02 DIAGNOSIS — C3490 Malignant neoplasm of unspecified part of unspecified bronchus or lung: Secondary | ICD-10-CM | POA: Diagnosis not present

## 2019-10-02 DIAGNOSIS — C155 Malignant neoplasm of lower third of esophagus: Secondary | ICD-10-CM

## 2019-10-02 DIAGNOSIS — R1319 Other dysphagia: Secondary | ICD-10-CM

## 2019-10-02 DIAGNOSIS — C159 Malignant neoplasm of esophagus, unspecified: Secondary | ICD-10-CM | POA: Diagnosis not present

## 2019-10-02 DIAGNOSIS — R1310 Dysphagia, unspecified: Secondary | ICD-10-CM

## 2019-10-02 DIAGNOSIS — Z79899 Other long term (current) drug therapy: Secondary | ICD-10-CM | POA: Diagnosis not present

## 2019-10-02 DIAGNOSIS — Z87891 Personal history of nicotine dependence: Secondary | ICD-10-CM | POA: Diagnosis not present

## 2019-10-02 DIAGNOSIS — D500 Iron deficiency anemia secondary to blood loss (chronic): Secondary | ICD-10-CM

## 2019-10-02 DIAGNOSIS — C7951 Secondary malignant neoplasm of bone: Secondary | ICD-10-CM | POA: Diagnosis not present

## 2019-10-02 DIAGNOSIS — Z86718 Personal history of other venous thrombosis and embolism: Secondary | ICD-10-CM | POA: Diagnosis not present

## 2019-10-02 DIAGNOSIS — Z7901 Long term (current) use of anticoagulants: Secondary | ICD-10-CM | POA: Diagnosis not present

## 2019-10-02 DIAGNOSIS — Z86711 Personal history of pulmonary embolism: Secondary | ICD-10-CM | POA: Diagnosis not present

## 2019-10-02 NOTE — Telephone Encounter (Signed)
I have spoken to patient to advise that per Dr Irene Limbo, she may hold Xarelto 2 days prior to her upcoming procedure with Dr [name]. She verbalizes understanding of this.

## 2019-10-02 NOTE — Telephone Encounter (Signed)
Rolland Bimler, RN  Larina Bras, CMA   Cc: Rolland Bimler, RN        Hi Cindy Byrd,  Dr. Irene Limbo gave verbal order that it's ok for Cindy Byrd to hold her Xarelto for 2 days prior to procedure on 10/08/2019.  Please let me know if you need anything else.  Sandi

## 2019-10-03 ENCOUNTER — Telehealth: Payer: Self-pay | Admitting: Hematology

## 2019-10-03 NOTE — Telephone Encounter (Signed)
Scheduled per los. Called and spoke with patient. Confirmed appts  

## 2019-10-04 ENCOUNTER — Other Ambulatory Visit (HOSPITAL_COMMUNITY)
Admission: RE | Admit: 2019-10-04 | Discharge: 2019-10-04 | Disposition: A | Discharge status: Home / Self Care | Payer: Medicare Other | Source: Ambulatory Visit | Attending: Internal Medicine | Admitting: Internal Medicine

## 2019-10-04 DIAGNOSIS — Z20828 Contact with and (suspected) exposure to other viral communicable diseases: Secondary | ICD-10-CM | POA: Diagnosis not present

## 2019-10-04 DIAGNOSIS — Z01812 Encounter for preprocedural laboratory examination: Secondary | ICD-10-CM | POA: Diagnosis not present

## 2019-10-05 LAB — NOVEL CORONAVIRUS, NAA (HOSP ORDER, SEND-OUT TO REF LAB; TAT 18-24 HRS): SARS-CoV-2, NAA: NOT DETECTED

## 2019-10-07 ENCOUNTER — Encounter (HOSPITAL_COMMUNITY): Payer: Self-pay | Admitting: *Deleted

## 2019-10-07 ENCOUNTER — Other Ambulatory Visit: Payer: Self-pay

## 2019-10-08 ENCOUNTER — Ambulatory Visit (HOSPITAL_COMMUNITY): Payer: Medicare Other | Admitting: Certified Registered"

## 2019-10-08 ENCOUNTER — Encounter (HOSPITAL_COMMUNITY): Payer: Self-pay | Admitting: *Deleted

## 2019-10-08 ENCOUNTER — Encounter (HOSPITAL_COMMUNITY)
Admission: RE | Disposition: A | Discharge status: Home / Self Care | Payer: Self-pay | Source: Home / Self Care | Attending: Internal Medicine

## 2019-10-08 ENCOUNTER — Ambulatory Visit (HOSPITAL_COMMUNITY)
Admission: RE | Admit: 2019-10-08 | Discharge: 2019-10-08 | Disposition: A | Discharge status: Home / Self Care | Payer: Medicare Other | Attending: Internal Medicine | Admitting: Internal Medicine

## 2019-10-08 DIAGNOSIS — Z86711 Personal history of pulmonary embolism: Secondary | ICD-10-CM | POA: Insufficient documentation

## 2019-10-08 DIAGNOSIS — Z8501 Personal history of malignant neoplasm of esophagus: Secondary | ICD-10-CM | POA: Diagnosis not present

## 2019-10-08 DIAGNOSIS — K219 Gastro-esophageal reflux disease without esophagitis: Secondary | ICD-10-CM | POA: Diagnosis not present

## 2019-10-08 DIAGNOSIS — J449 Chronic obstructive pulmonary disease, unspecified: Secondary | ICD-10-CM | POA: Diagnosis not present

## 2019-10-08 DIAGNOSIS — C7951 Secondary malignant neoplasm of bone: Secondary | ICD-10-CM | POA: Diagnosis not present

## 2019-10-08 DIAGNOSIS — C159 Malignant neoplasm of esophagus, unspecified: Secondary | ICD-10-CM

## 2019-10-08 DIAGNOSIS — K222 Esophageal obstruction: Secondary | ICD-10-CM | POA: Insufficient documentation

## 2019-10-08 DIAGNOSIS — Z923 Personal history of irradiation: Secondary | ICD-10-CM | POA: Diagnosis not present

## 2019-10-08 DIAGNOSIS — F329 Major depressive disorder, single episode, unspecified: Secondary | ICD-10-CM | POA: Insufficient documentation

## 2019-10-08 DIAGNOSIS — R933 Abnormal findings on diagnostic imaging of other parts of digestive tract: Secondary | ICD-10-CM | POA: Diagnosis not present

## 2019-10-08 DIAGNOSIS — Z9221 Personal history of antineoplastic chemotherapy: Secondary | ICD-10-CM | POA: Insufficient documentation

## 2019-10-08 DIAGNOSIS — R1319 Other dysphagia: Secondary | ICD-10-CM

## 2019-10-08 DIAGNOSIS — K2970 Gastritis, unspecified, without bleeding: Secondary | ICD-10-CM | POA: Diagnosis not present

## 2019-10-08 DIAGNOSIS — R1310 Dysphagia, unspecified: Secondary | ICD-10-CM | POA: Diagnosis not present

## 2019-10-08 DIAGNOSIS — Z86718 Personal history of other venous thrombosis and embolism: Secondary | ICD-10-CM | POA: Diagnosis not present

## 2019-10-08 DIAGNOSIS — I10 Essential (primary) hypertension: Secondary | ICD-10-CM | POA: Insufficient documentation

## 2019-10-08 DIAGNOSIS — C155 Malignant neoplasm of lower third of esophagus: Secondary | ICD-10-CM | POA: Diagnosis not present

## 2019-10-08 DIAGNOSIS — Z08 Encounter for follow-up examination after completed treatment for malignant neoplasm: Secondary | ICD-10-CM | POA: Insufficient documentation

## 2019-10-08 DIAGNOSIS — Z79899 Other long term (current) drug therapy: Secondary | ICD-10-CM | POA: Insufficient documentation

## 2019-10-08 DIAGNOSIS — R197 Diarrhea, unspecified: Secondary | ICD-10-CM | POA: Insufficient documentation

## 2019-10-08 DIAGNOSIS — K449 Diaphragmatic hernia without obstruction or gangrene: Secondary | ICD-10-CM | POA: Diagnosis not present

## 2019-10-08 HISTORY — PX: ESOPHAGOGASTRODUODENOSCOPY (EGD) WITH PROPOFOL: SHX5813

## 2019-10-08 HISTORY — PX: BALLOON DILATION: SHX5330

## 2019-10-08 SURGERY — ESOPHAGOGASTRODUODENOSCOPY (EGD) WITH PROPOFOL
Anesthesia: Monitor Anesthesia Care

## 2019-10-08 MED ORDER — LIDOCAINE 2% (20 MG/ML) 5 ML SYRINGE
INTRAMUSCULAR | Status: DC | PRN
  Administered 2019-10-08: 40 mg via INTRAVENOUS

## 2019-10-08 MED ORDER — LACTATED RINGERS IV SOLN
INTRAVENOUS | Status: DC
  Administered 2019-10-08: 10:00:00 1000 mL via INTRAVENOUS

## 2019-10-08 MED ORDER — SODIUM CHLORIDE 0.9 % IV SOLN: INTRAVENOUS | Status: DC

## 2019-10-08 MED ORDER — PROPOFOL 500 MG/50ML IV EMUL
INTRAVENOUS | Status: DC | PRN
  Administered 2019-10-08: 125 ug/kg/min via INTRAVENOUS

## 2019-10-08 MED ORDER — PROPOFOL 500 MG/50ML IV EMUL
INTRAVENOUS | Status: AC
  Filled 2019-10-08: qty 50

## 2019-10-08 MED ORDER — PROPOFOL 10 MG/ML IV BOLUS
INTRAVENOUS | Status: DC | PRN
  Administered 2019-10-08: 20 mg via INTRAVENOUS

## 2019-10-08 SURGICAL SUPPLY — 15 items

## 2019-10-08 NOTE — Interval H&P Note (Signed)
History and Physical Interval Note: For EGD today to evaluate dysphagia.  No interval change since I saw her recently. The nature of the procedure, as well as the risks, benefits, and alternatives were carefully and thoroughly reviewed with the patient. Ample time for discussion and questions allowed. The patient understood, was satisfied, and agreed to proceed.     10/08/2019 10:06 AM  Cindy Byrd  has presented today for surgery, with the diagnosis of esophageal cancer; esophageal narrowing.  The various methods of treatment have been discussed with the patient and family. After consideration of risks, benefits and other options for treatment, the patient has consented to  Procedure(s): ESOPHAGOGASTRODUODENOSCOPY (EGD) WITH PROPOFOL (N/A) as a surgical intervention.  The patient's history has been reviewed, patient examined, no change in status, stable for surgery.  I have reviewed the patient's chart and labs.  Questions were answered to the patient's satisfaction.     [name] [name]

## 2019-10-08 NOTE — Transfer of Care (Signed)
Immediate Anesthesia Transfer of Care Note  Patient: Cindy Byrd  Procedure(s) Performed: ESOPHAGOGASTRODUODENOSCOPY (EGD) WITH PROPOFOL (N/A ) BALLOON DILATION (N/A )  Patient Location: PACU and Endoscopy Unit  Anesthesia Type:MAC  Level of Consciousness: drowsy and patient cooperative  Airway & Oxygen Therapy: Patient Spontanous Breathing and Patient connected to nasal cannula oxygen  Post-op Assessment: Report given to RN and Post -op Vital signs reviewed and stable  Post vital signs: Reviewed and stable  Last Vitals:  Vitals Value Taken Time  BP 159/68 10/08/19 1050  Temp    Pulse 61 10/08/19 1052  Resp 21 10/08/19 1052  SpO2 97 % 10/08/19 1052  Vitals shown include unvalidated device data.  Last Pain:  Vitals:   10/08/19 0953  TempSrc: Oral  PainSc: 0-No pain         Complications: No apparent anesthesia complications

## 2019-10-08 NOTE — Discharge Instructions (Signed)
Resume Xarelto at previous dose on Thursday, 10/10/2019  YOU HAD AN ENDOSCOPIC PROCEDURE TODAY: Refer to the procedure report and other information in the discharge instructions given to you for any specific questions about what was found during the examination. If this information does not answer your questions, please call [place] office at [phone] to clarify.   YOU SHOULD EXPECT: Some feelings of bloating in the abdomen. Passage of more gas than usual. Walking can help get rid of the air that was put into your GI tract during the procedure and reduce the bloating. If you had a lower endoscopy (such as a colonoscopy or flexible sigmoidoscopy) you may notice spotting of blood in your stool or on the toilet paper. Some abdominal soreness may be present for a day or two, also.  DIET: Your first meal following the procedure should be a light meal and then it is ok to progress to your normal diet. A half-sandwich or bowl of soup is an example of a good first meal. Heavy or fried foods are harder to digest and may make you feel nauseous or bloated. Drink plenty of fluids but you should avoid alcoholic beverages for 24 hours. If you had a esophageal dilation, please see attached instructions for diet.    ACTIVITY: Your care partner should take you home directly after the procedure. You should plan to take it easy, moving slowly for the rest of the day. You can resume normal activity the day after the procedure however YOU SHOULD NOT DRIVE, use power tools, machinery or perform tasks that involve climbing or major physical exertion for 24 hours (because of the sedation medicines used during the test).   SYMPTOMS TO REPORT IMMEDIATELY: A gastroenterologist can be reached at any hour. Please call [phone]  for any of the following symptoms:  Following upper endoscopy (EGD, EUS, ERCP, esophageal dilation) Vomiting of blood or coffee ground material  New, significant abdominal pain  New, significant  chest pain or pain under the shoulder blades  Painful or persistently difficult swallowing  New shortness of breath  Black, tarry-looking or red, bloody stools  FOLLOW UP:  If any biopsies were taken you will be contacted by phone or by letter within the next 1-3 weeks. Call [phone]  if you have not heard about the biopsies in 3 weeks.  Please also call with any specific questions about appointments or follow up tests.

## 2019-10-08 NOTE — Anesthesia Postprocedure Evaluation (Signed)
Anesthesia Post Note  Patient: Cindy Byrd  Procedure(s) Performed: ESOPHAGOGASTRODUODENOSCOPY (EGD) WITH PROPOFOL (N/A ) BALLOON DILATION (N/A )     Patient location during evaluation: Phase II Anesthesia Type: MAC Level of consciousness: awake Pain management: pain level controlled Vital Signs Assessment: post-procedure vital signs reviewed and stable Respiratory status: spontaneous breathing Cardiovascular status: stable Postop Assessment: no apparent nausea or vomiting Anesthetic complications: no    Last Vitals:  Vitals:   10/08/19 1051 10/08/19 1100  BP: (!) 159/68 (!) 177/69  Pulse: 62 (!) 54  Resp: (!) 21 15  Temp: 36.4 C   SpO2: 97% 97%    Last Pain:  Vitals:   10/08/19 1100  TempSrc:   PainSc: 0-No pain   Pain Goal:                   Huston Foley

## 2019-10-08 NOTE — Anesthesia Preprocedure Evaluation (Signed)
Anesthesia Evaluation  Patient identified by MRN, date of birth, ID band Patient awake    Reviewed: Allergy & Precautions, NPO status , Patient's Chart, lab work & pertinent test results  Airway Mallampati: I  TM Distance: >3 FB Neck ROM: Full    Dental  (+) Dental Advisory Given, Partial Lower, Partial Upper   Pulmonary pneumonia, COPD, Current Smoker, former smoker,    Pulmonary exam normal breath sounds clear to auscultation       Cardiovascular hypertension, Pt. on medications Normal cardiovascular exam Rhythm:Regular     Neuro/Psych PSYCHIATRIC DISORDERS Anxiety Depression    GI/Hepatic Neg liver ROS, hiatal hernia, PUD, GERD  Medicated and Controlled,  Endo/Other  negative endocrine ROS  Renal/GU Renal disease  negative genitourinary   Musculoskeletal negative musculoskeletal ROS (+)   Abdominal Normal abdominal exam  (+)   Peds  Hematology negative hematology ROS (+)   Anesthesia Other Findings   Reproductive/Obstetrics                             Anesthesia Physical  Anesthesia Plan  ASA: III  Anesthesia Plan: MAC   Post-op Pain Management:    Induction: Intravenous  PONV Risk Score and Plan:   Airway Management Planned: Natural Airway and Mask  Additional Equipment: None  Intra-op Plan:   Post-operative Plan:   Informed Consent: I have reviewed the patients History and Physical, chart, labs and discussed the procedure including the risks, benefits and alternatives for the proposed anesthesia with the patient or authorized representative who has indicated his/her understanding and acceptance.     Dental advisory given  Plan Discussed with: CRNA  Anesthesia Plan Comments: (Pt ma keep her teeth. Pt aware of Risks)        Anesthesia Quick Evaluation

## 2019-10-08 NOTE — Op Note (Signed)
[facility] Patient Name: Cindy Byrd Procedure Date: 10/08/2019 MRN: [id number] Attending MD: [name] , MD Date of Birth: September 16, 1941 CSN: [id number] Age: 78 Admit Type: Outpatient Procedure:                Upper GI endoscopy Indications:              Surveillance for malignancy due to personal history                            of esophageal cancer, Dysphagia, Abnormal                            cine-esophagram Providers:                [name]. [name], MD, Glori Bickers, RN, Lina Sar,                            Technician, Jefm Miles, CRNA Referring MD:             [name] Medicines:                Monitored Anesthesia Care Complications:            No immediate complications. Estimated Blood Loss:     Estimated blood loss was minimal. Procedure:                Pre-Anesthesia Assessment:                           - Prior to the procedure, a History and Physical                            was performed, and patient medications and                            allergies were reviewed. The patient's tolerance of                            previous anesthesia was also reviewed. The risks                            and benefits of the procedure and the sedation                            options and risks were discussed with the patient.                            All questions were answered, and informed consent                            was obtained. Prior Anticoagulants: The patient has                            taken Xarelto (rivaroxaban), last dose was 4 days  prior to procedure. ASA Grade Assessment: III - A                            patient with severe systemic disease. After                            reviewing the risks and benefits, the patient was                            deemed in satisfactory condition to undergo the                            procedure.                           After obtaining informed  consent, the endoscope was                            passed under direct vision. Throughout the                            procedure, the patient's blood pressure, pulse, and                            oxygen saturations were monitored continuously. The                            [id number]-H190 ([id number]) Olympus gastroscope was                            introduced through the mouth, and advanced to the                            second part of duodenum. The upper GI endoscopy was                            accomplished without difficulty. The patient                            tolerated the procedure well. Scope In: Scope Out: Findings:      Pale appearing esophageal mucosa in the mid and distal esophagus       consistent with radiation effect. There were esophageal mucosal changes       consistent with Barrett's esophagus present in the lower third of the       esophagus. There is a patch of salmon-colored mucosa in the distal       esophagus above the GE junction. This is without nodularity or mass. The       maximum longitudinal extent of these mucosal changes was 3-4 cm in       length.      One benign-appearing, intrinsic moderate (circumferential scarring or       stenosis; an endoscope may pass) stenosis was found 36 cm from the       incisors. This stenosis measured 1 cm (in length). There was evidence of  inflammation with exudate at the stricture likely related to prior       radiation and possibly reflux. The stenosis was traversed. A TTS dilator       was passed through the scope. Dilation with a 12-13.5-15 mm balloon       dilator was performed to 15 mm (after 12 mm with no resistance and 13.5       mm without mucosa disruption). The dilation site was examined and showed       moderate mucosal disruption and moderate improvement in luminal       narrowing at the stricture.      A 3 cm hiatal hernia was present.      Scattered mild inflammation characterized by erythema was  found in the       gastric fundus and in the gastric body. Query radiation effect.      The exam of the stomach was otherwise normal.      The examined duodenum was normal. Impression:               - Esophageal mucosal changes consistent with                            Barrett's esophagus. No evidence of tumor or mass                            lesion.                           - Benign-appearing esophageal stenosis in the                            distal esophagus (likely related to radiation                            treatment). Dilated to 15 mm with apparent                            treatment effect.                           - 3 cm hiatal hernia.                           - Mild, scattered, gastritis without bleeding in                            the proximal stomach (likely related to previous                            radiation).                           - Normal examined duodenum.                           - No specimens collected. Moderate Sedation:      N/A Recommendation:           - Patient has a contact number available for  emergencies. The signs and symptoms of potential                            delayed complications were discussed with the                            patient. Return to normal activities tomorrow.                            Written discharge instructions were provided to the                            patient.                           - Post-dilation liquid diet for 2 hours, then                            slowly advance as tolerated.                           - Continue present medications including omeprazole                            40 mg daily.                           - Resume Xarelto (rivaroxaban) at prior dose in 2                            days. Refer to managing physician for further                            adjustment of therapy.                           - Repeat upper endoscopy as needed for  retreatment.                            Please let me know if you continue to have trouble                            swallowing.                           - Follow-up with Dr. Irene Limbo as scheduled.                           - Consider repeat EGD in 1 year. Procedure Code(s):        --- Professional ---                           [phone], Esophagogastroduodenoscopy, flexible,                            transoral; with transendoscopic balloon dilation  of                            esophagus (less than 30 mm diameter) Diagnosis Code(s):        --- Professional ---                           K22.8, Other specified diseases of esophagus                           K22.2, Esophageal obstruction                           K29.70, Gastritis, unspecified, without bleeding                           K44.9, Diaphragmatic hernia without obstruction or                            gangrene                           Z85.01, Personal history of malignant neoplasm of                            esophagus                           R13.10, Dysphagia, unspecified                           R93.3, Abnormal findings on diagnostic imaging of                            other parts of digestive tract CPT copyright 2019 [employer]. All rights reserved. The codes documented in this report are preliminary and upon coder review may  be revised to meet current compliance requirements. [name], MD 10/08/2019 11:03:01 AM This report has been signed electronically. Number of Addenda: 0

## 2019-10-08 NOTE — Anesthesia Procedure Notes (Signed)
Procedure Name: MAC Date/Time: 10/08/2019 10:23 AM Performed by: Eben Burow, CRNA Pre-anesthesia Checklist: Patient identified, Emergency Drugs available, Suction available, Patient being monitored and Timeout performed Oxygen Delivery Method: Nasal cannula Dental Injury: Teeth and Oropharynx as per pre-operative assessment

## 2019-10-09 ENCOUNTER — Encounter (HOSPITAL_COMMUNITY): Payer: Self-pay | Admitting: Internal Medicine

## 2019-10-15 ENCOUNTER — Other Ambulatory Visit: Payer: Self-pay

## 2019-10-15 DIAGNOSIS — C155 Malignant neoplasm of lower third of esophagus: Secondary | ICD-10-CM

## 2019-10-16 ENCOUNTER — Inpatient Hospital Stay: Payer: Medicare Other

## 2019-10-16 ENCOUNTER — Inpatient Hospital Stay: Payer: Medicare Other | Attending: Hematology

## 2019-10-16 ENCOUNTER — Other Ambulatory Visit: Payer: Self-pay

## 2019-10-16 ENCOUNTER — Ambulatory Visit: Payer: Medicare Other | Admitting: Adult Health

## 2019-10-16 VITALS — BP 172/68 | HR 76 | Temp 98.0°F | Resp 18

## 2019-10-16 DIAGNOSIS — R197 Diarrhea, unspecified: Secondary | ICD-10-CM

## 2019-10-16 DIAGNOSIS — C155 Malignant neoplasm of lower third of esophagus: Secondary | ICD-10-CM

## 2019-10-16 DIAGNOSIS — Z95828 Presence of other vascular implants and grafts: Secondary | ICD-10-CM

## 2019-10-16 DIAGNOSIS — C3490 Malignant neoplasm of unspecified part of unspecified bronchus or lung: Secondary | ICD-10-CM | POA: Diagnosis not present

## 2019-10-16 DIAGNOSIS — Z7189 Other specified counseling: Secondary | ICD-10-CM

## 2019-10-16 DIAGNOSIS — K521 Toxic gastroenteritis and colitis: Secondary | ICD-10-CM

## 2019-10-16 DIAGNOSIS — C7951 Secondary malignant neoplasm of bone: Secondary | ICD-10-CM | POA: Insufficient documentation

## 2019-10-16 DIAGNOSIS — K529 Noninfective gastroenteritis and colitis, unspecified: Secondary | ICD-10-CM

## 2019-10-16 DIAGNOSIS — C3491 Malignant neoplasm of unspecified part of right bronchus or lung: Secondary | ICD-10-CM

## 2019-10-16 LAB — CMP (CANCER CENTER ONLY)
ALT: 6 U/L (ref 0–44)
AST: 17 U/L (ref 15–41)
Albumin: 3.2 g/dL — ABNORMAL LOW (ref 3.5–5.0)
Alkaline Phosphatase: 66 U/L (ref 38–126)
Anion gap: 7 (ref 5–15)
BUN: 13 mg/dL (ref 8–23)
CO2: 24 mmol/L (ref 22–32)
Calcium: 8.4 mg/dL — ABNORMAL LOW (ref 8.9–10.3)
Chloride: 108 mmol/L (ref 98–111)
Creatinine: 1 mg/dL (ref 0.44–1.00)
GFR, Est AFR Am: >60 mL/min (ref >60)
GFR, Estimated: 54 mL/min — ABNORMAL LOW (ref >60)
Glucose, Bld: 88 mg/dL (ref 70–99)
Potassium: 4 mmol/L (ref 3.5–5.1)
Sodium: 139 mmol/L (ref 135–145)
Total Bilirubin: 0.3 mg/dL (ref 0.3–1.2)
Total Protein: 7.1 g/dL (ref 6.5–8.1)

## 2019-10-16 LAB — CBC WITH DIFFERENTIAL (CANCER CENTER ONLY)
Abs Immature Granulocytes: 0.01 10*3/uL (ref 0.00–0.07)
Basophils Absolute: 0 10*3/uL (ref 0.0–0.1)
Basophils Relative: 1 %
Eosinophils Absolute: 0.1 10*3/uL (ref 0.0–0.5)
Eosinophils Relative: 2 %
HCT: 32.1 % — ABNORMAL LOW (ref 36.0–46.0)
Hemoglobin: 10.2 g/dL — ABNORMAL LOW (ref 12.0–15.0)
Immature Granulocytes: 0 %
Lymphocytes Relative: 21 %
Lymphs Abs: 1.2 10*3/uL (ref 0.7–4.0)
MCH: 28.6 pg (ref 26.0–34.0)
MCHC: 31.8 g/dL (ref 30.0–36.0)
MCV: 89.9 fL (ref 80.0–100.0)
Monocytes Absolute: 0.5 10*3/uL (ref 0.1–1.0)
Monocytes Relative: 8 %
Neutro Abs: 4 10*3/uL (ref 1.7–7.7)
Neutrophils Relative %: 68 %
Platelet Count: 169 10*3/uL (ref 150–400)
RBC: 3.57 MIL/uL — ABNORMAL LOW (ref 3.87–5.11)
RDW: 14.5 % (ref 11.5–15.5)
WBC Count: 5.8 10*3/uL (ref 4.0–10.5)
nRBC: 0 % (ref 0.0–0.2)

## 2019-10-16 LAB — MAGNESIUM: Magnesium: 1.7 mg/dL (ref 1.7–2.4)

## 2019-10-16 MED ORDER — OCTREOTIDE ACETATE 20 MG IM KIT
PACK | INTRAMUSCULAR | Status: AC
  Filled 2019-10-16: qty 1

## 2019-10-16 MED ORDER — OCTREOTIDE ACETATE 30 MG IM KIT
30.0000 mg | PACK | Freq: Once | INTRAMUSCULAR | Status: AC
  Administered 2019-10-16: 30 mg via INTRAMUSCULAR

## 2019-10-16 MED ORDER — DENOSUMAB 120 MG/1.7ML ~~LOC~~ SOLN: 120.0000 mg | Freq: Once | SUBCUTANEOUS | Status: DC

## 2019-10-16 MED ORDER — SODIUM CHLORIDE 0.9% FLUSH
10.0000 mL | Freq: Once | INTRAVENOUS | Status: AC
  Administered 2019-10-16: 10 mL
  Filled 2019-10-16: qty 10

## 2019-10-16 MED ORDER — OCTREOTIDE ACETATE 30 MG IM KIT
PACK | INTRAMUSCULAR | Status: AC
  Filled 2019-10-16: qty 1

## 2019-10-16 MED ORDER — HEPARIN SOD (PORK) LOCK FLUSH 100 UNIT/ML IV SOLN
500.0000 [IU] | Freq: Once | INTRAVENOUS | Status: AC
  Administered 2019-10-16: 500 [IU]
  Filled 2019-10-16: qty 5

## 2019-10-16 NOTE — Patient Instructions (Signed)

## 2019-10-16 NOTE — Patient Instructions (Signed)
Octreotide injection solution °What is this medicine? °OCTREOTIDE (ok TREE oh tide) is used to reduce blood levels of growth hormone in patients with a condition called acromegaly. This medicine also reduces flushing and watery diarrhea caused by certain types of cancer. °This medicine may be used for other purposes; ask your health care provider or pharmacist if you have questions. °COMMON BRAND NAME(S): Bynfezia, Sandostatin, Sandostatin LAR °What should I tell my health care provider before I take this medicine? °They need to know if you have any of these conditions: °· diabetes °· gallbladder disease °· kidney disease °· liver disease °· an unusual or allergic reaction to octreotide, other medicines, foods, dyes, or preservatives °· pregnant or trying to get pregnant °· breast-feeding °How should I use this medicine? °This medicine is for injection under the skin or into a vein (only in emergency situations). It is usually given by a health care professional in a hospital or clinic setting. °If you get this medicine at home, you will be taught how to prepare and give this medicine. Allow the injection solution to come to room temperature before use. Do not warm it artificially. Use exactly as directed. Take your medicine at regular intervals. Do not take your medicine more often than directed. °It is important that you put your used needles and syringes in a special sharps container. Do not put them in a trash can. If you do not have a sharps container, call your pharmacist or healthcare provider to get one. °Talk to your pediatrician regarding the use of this medicine in children. Special care may be needed. °Overdosage: If you think you have taken too much of this medicine contact a poison control center or emergency room at once. °NOTE: This medicine is only for you. Do not share this medicine with others. °What if I miss a dose? °If you miss a dose, take it as soon as you can. If it is almost time for your  next dose, take only that dose. Do not take double or extra doses. °What may interact with this medicine? °Do not take this medicine with any of the following medications: °· cisapride °· droperidol °· general anesthetics °· grepafloxacin °· perphenazine °· thioridazine °This medicine may also interact with the following medications: °· bromocriptine °· cyclosporine °· diuretics °· medicines for blood pressure, heart disease, irregular heart beat °· medicines for diabetes, including insulin °· quinidine °This list may not describe all possible interactions. Give your health care provider a list of all the medicines, herbs, non-prescription drugs, or dietary supplements you use. Also tell them if you smoke, drink alcohol, or use illegal drugs. Some items may interact with your medicine. °What should I watch for while using this medicine? °Visit your doctor or health care professional for regular checks on your progress. °To help reduce irritation at the injection site, use a different site for each injection and make sure the solution is at room temperature before use. °This medicine may cause decreases in blood sugar. Signs of low blood sugar include chills, cool, pale skin or cold sweats, drowsiness, extreme hunger, fast heartbeat, headache, nausea, nervousness or anxiety, shakiness, trembling, unsteadiness, tiredness, or weakness. Contact your doctor or health care professional right away if you experience any of these symptoms. °This medicine may increase blood sugar. Ask your healthcare provider if changes in diet or medicines are needed if you have diabetes. °This medicine may cause a decrease in vitamin B12. You should make sure that you get enough vitamin B12   while you are taking this medicine. Discuss the foods you eat and the vitamins you take with your health care professional. °What side effects may I notice from receiving this medicine? °Side effects that you should report to your doctor or health care  professional as soon as possible: °· allergic reactions like skin rash, itching or hives, swelling of the face, lips, or tongue °· decreases in blood sugar °· changes in heart rate °· severe stomach pain °·  °signs and symptoms of high blood sugar such as being more thirsty or hungry or having to urinate more than normal. You may also feel very tired or have blurry vision. °Side effects that usually do not require medical attention (report to your doctor or health care professional if they continue or are bothersome): °· diarrhea or constipation °· gas or stomach pain °· nausea, vomiting °· pain, redness, swelling and irritation at site where injected °This list may not describe all possible side effects. Call your doctor for medical advice about side effects. You may report side effects to FDA at 1-800-FDA-1088. °Where should I keep my medicine? °Keep out of the reach of children. °Store in a refrigerator between 2 and 8 degrees C (36 and 46 degrees F). Protect from light. Allow to come to room temperature naturally. Do not use artificial heat. If protected from light, the injection may be stored at room temperature between 20 and 30 degrees C (70 and 86 degrees F) for 14 days. After the initial use, throw away any unused portion of a multiple dose vial after 14 days. Throw away unused portions of the ampules after use. °NOTE: This sheet is a summary. It may not cover all possible information. If you have questions about this medicine, talk to your doctor, pharmacist, or health care provider. °© 2020 [employer] (2018-08-30 08:07:09) ° °

## 2019-11-11 ENCOUNTER — Other Ambulatory Visit: Payer: Self-pay

## 2019-11-11 DIAGNOSIS — C155 Malignant neoplasm of lower third of esophagus: Secondary | ICD-10-CM

## 2019-11-11 NOTE — Progress Notes (Signed)
cbc

## 2019-11-13 ENCOUNTER — Telehealth: Payer: Self-pay | Admitting: Hematology

## 2019-11-13 ENCOUNTER — Inpatient Hospital Stay: Payer: Medicare Other

## 2019-11-13 ENCOUNTER — Inpatient Hospital Stay: Payer: Medicare Other | Attending: Hematology | Admitting: Hematology

## 2019-11-13 ENCOUNTER — Telehealth: Payer: Self-pay

## 2019-11-13 ENCOUNTER — Ambulatory Visit: Payer: Medicare Other

## 2019-11-13 ENCOUNTER — Other Ambulatory Visit: Payer: Medicare Other

## 2019-11-13 ENCOUNTER — Other Ambulatory Visit: Payer: Self-pay

## 2019-11-13 VITALS — BP 152/69 | HR 88 | Temp 98.0°F | Resp 18 | Ht 68.0 in | Wt 136.6 lb

## 2019-11-13 DIAGNOSIS — D500 Iron deficiency anemia secondary to blood loss (chronic): Secondary | ICD-10-CM | POA: Diagnosis not present

## 2019-11-13 DIAGNOSIS — Z95828 Presence of other vascular implants and grafts: Secondary | ICD-10-CM

## 2019-11-13 DIAGNOSIS — C3490 Malignant neoplasm of unspecified part of unspecified bronchus or lung: Secondary | ICD-10-CM | POA: Diagnosis not present

## 2019-11-13 DIAGNOSIS — Z7189 Other specified counseling: Secondary | ICD-10-CM

## 2019-11-13 DIAGNOSIS — K521 Toxic gastroenteritis and colitis: Secondary | ICD-10-CM

## 2019-11-13 DIAGNOSIS — C155 Malignant neoplasm of lower third of esophagus: Secondary | ICD-10-CM

## 2019-11-13 DIAGNOSIS — Z86711 Personal history of pulmonary embolism: Secondary | ICD-10-CM | POA: Diagnosis not present

## 2019-11-13 DIAGNOSIS — Z79899 Other long term (current) drug therapy: Secondary | ICD-10-CM | POA: Diagnosis not present

## 2019-11-13 DIAGNOSIS — Z803 Family history of malignant neoplasm of breast: Secondary | ICD-10-CM | POA: Insufficient documentation

## 2019-11-13 DIAGNOSIS — C7951 Secondary malignant neoplasm of bone: Secondary | ICD-10-CM | POA: Diagnosis not present

## 2019-11-13 DIAGNOSIS — Z7901 Long term (current) use of anticoagulants: Secondary | ICD-10-CM | POA: Insufficient documentation

## 2019-11-13 DIAGNOSIS — K529 Noninfective gastroenteritis and colitis, unspecified: Secondary | ICD-10-CM

## 2019-11-13 DIAGNOSIS — R197 Diarrhea, unspecified: Secondary | ICD-10-CM

## 2019-11-13 DIAGNOSIS — Z87891 Personal history of nicotine dependence: Secondary | ICD-10-CM | POA: Diagnosis not present

## 2019-11-13 DIAGNOSIS — Z800 Family history of malignant neoplasm of digestive organs: Secondary | ICD-10-CM | POA: Insufficient documentation

## 2019-11-13 DIAGNOSIS — C3491 Malignant neoplasm of unspecified part of right bronchus or lung: Secondary | ICD-10-CM

## 2019-11-13 DIAGNOSIS — Z86718 Personal history of other venous thrombosis and embolism: Secondary | ICD-10-CM | POA: Insufficient documentation

## 2019-11-13 LAB — CBC WITH DIFFERENTIAL (CANCER CENTER ONLY)
Abs Immature Granulocytes: 0.02 10*3/uL (ref 0.00–0.07)
Basophils Absolute: 0 10*3/uL (ref 0.0–0.1)
Basophils Relative: 1 %
Eosinophils Absolute: 0.1 10*3/uL (ref 0.0–0.5)
Eosinophils Relative: 2 %
HCT: 32.9 % — ABNORMAL LOW (ref 36.0–46.0)
Hemoglobin: 10.4 g/dL — ABNORMAL LOW (ref 12.0–15.0)
Immature Granulocytes: 1 %
Lymphocytes Relative: 19 %
Lymphs Abs: 0.9 10*3/uL (ref 0.7–4.0)
MCH: 27.7 pg (ref 26.0–34.0)
MCHC: 31.6 g/dL (ref 30.0–36.0)
MCV: 87.5 fL (ref 80.0–100.0)
Monocytes Absolute: 0.5 10*3/uL (ref 0.1–1.0)
Monocytes Relative: 10 %
Neutro Abs: 3 10*3/uL (ref 1.7–7.7)
Neutrophils Relative %: 67 %
Platelet Count: 161 10*3/uL (ref 150–400)
RBC: 3.76 MIL/uL — ABNORMAL LOW (ref 3.87–5.11)
RDW: 14.2 % (ref 11.5–15.5)
WBC Count: 4.4 10*3/uL (ref 4.0–10.5)
nRBC: 0 % (ref 0.0–0.2)

## 2019-11-13 LAB — CMP (CANCER CENTER ONLY)
ALT: 8 U/L (ref 0–44)
AST: 19 U/L (ref 15–41)
Albumin: 3.2 g/dL — ABNORMAL LOW (ref 3.5–5.0)
Alkaline Phosphatase: 57 U/L (ref 38–126)
Anion gap: 6 (ref 5–15)
BUN: 16 mg/dL (ref 8–23)
CO2: 26 mmol/L (ref 22–32)
Calcium: 8.4 mg/dL — ABNORMAL LOW (ref 8.9–10.3)
Chloride: 109 mmol/L (ref 98–111)
Creatinine: 1.12 mg/dL — ABNORMAL HIGH (ref 0.44–1.00)
GFR, Est AFR Am: 54 mL/min — ABNORMAL LOW (ref >60)
GFR, Estimated: 47 mL/min — ABNORMAL LOW (ref >60)
Glucose, Bld: 136 mg/dL — ABNORMAL HIGH (ref 70–99)
Potassium: 4.2 mmol/L (ref 3.5–5.1)
Sodium: 141 mmol/L (ref 135–145)
Total Bilirubin: 0.3 mg/dL (ref 0.3–1.2)
Total Protein: 7 g/dL (ref 6.5–8.1)

## 2019-11-13 LAB — MAGNESIUM: Magnesium: 1.6 mg/dL — ABNORMAL LOW (ref 1.7–2.4)

## 2019-11-13 MED ORDER — SODIUM CHLORIDE 0.9% FLUSH
10.0000 mL | Freq: Once | INTRAVENOUS | Status: AC
  Administered 2019-11-13: 10:00:00 10 mL
  Filled 2019-11-13: qty 10

## 2019-11-13 MED ORDER — OCTREOTIDE ACETATE 30 MG IM KIT
PACK | INTRAMUSCULAR | Status: AC
  Filled 2019-11-13: qty 1

## 2019-11-13 MED ORDER — OCTREOTIDE ACETATE 30 MG IM KIT
30.0000 mg | PACK | Freq: Once | INTRAMUSCULAR | Status: AC
  Administered 2019-11-13: 30 mg via INTRAMUSCULAR

## 2019-11-13 MED ORDER — DENOSUMAB 120 MG/1.7ML ~~LOC~~ SOLN
SUBCUTANEOUS | Status: AC
  Filled 2019-11-13: qty 1.7

## 2019-11-13 MED ORDER — HEPARIN SOD (PORK) LOCK FLUSH 100 UNIT/ML IV SOLN
500.0000 [IU] | Freq: Once | INTRAVENOUS | Status: AC
  Administered 2019-11-13: 500 [IU]
  Filled 2019-11-13: qty 5

## 2019-11-13 MED ORDER — DENOSUMAB 120 MG/1.7ML ~~LOC~~ SOLN
120.0000 mg | Freq: Once | SUBCUTANEOUS | Status: AC
  Administered 2019-11-13: 120 mg via SUBCUTANEOUS

## 2019-11-13 NOTE — Telephone Encounter (Signed)
Scheduled appt per 12/9 los - gave patient AVS and calender per los.

## 2019-11-13 NOTE — Progress Notes (Signed)
HEMATOLOGY ONCOLOGY PROGRESS NOTE  Date of service:  11/13/19    Patient Care Team: [name], NP as PCP - General (Family Medicine) [name], MD as Consulting Physician (Hematology and Oncology)  CHIEF COMPLAINTS/PURPOSE OF CONSULTATION:  F/u for metastatic lung cancer  DIAGNOSIS:   #1 Metastatic non-small cell lung cancer with bilateral lung nodules and large metastatic lesion in the left Ilium. 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 Also had c diff colitis - treated   Current Treatment  1) Active surveillance 2) Xgeva 118m [place] q4weeks for bone metastases. 3) Sandostatin q4weeks for diarrhea - immune colitis  Previous Treatment  1 Palliative radiation therapy to the large left ilium metastases 2. IV Nivolumab x 20ycles (discontinued due to likely immune colitis) 3. Xgeva 1251mSC q4weeks for bone metastases.   HISTORY OF PRESENTING ILLNESS: (plz see my previous consultation for details of initial presentation)  INTERVAL HISTORY:   Ms ToBrigandis presenting today for her scheduled follow-up for metastatic lung cancer, and adenocarcinoma of the esophagus. The patient's last visit with [place] on 10/02/2019. The pt reports that she is doing well overall.  The pt reports that she had no issues with her Upper Endoscopy on 11/03 with Dr. PyHilarie FredricksonPt does not have a direct follow-up scheduled with Dr. PyHilarie Fredricksonut will see him back as needed. Her esophageal symptoms were improved for sometime after her strictures were stretched but she feels as if the benefits have worn off now. She is eating soft foods and making sure that she is chewing her food thoroughly. She is eating about 80% of her baseline. Pt was taking Marinol initially but has not felt the need to use any recently. Pt is interested in getting IV iron as she is dealing with a significant amount of fatigue. She has continued to take Xarelto as  prescribed and notes some increased bruising. She has an intermittent pain in her lower right abdomen that is currently sore.   Pt attended a small, intimate celebration for Thanksgiving. She has continued to take precautions in light of the pandemic.   Of note since the patient's last visit, pt has had Upper Endoscopy completed on 10/08/2019 with results revealing "- Esophageal mucosal changes consistent with Barrett's esophagus. No evidence of tumor or mass lesion. - Benign-appearing esophageal stenosis in the distal esophagus (likely related to radiation treatment). Dilated to 15 mm with apparent treatment effect. - 3 cm hiatal hernia. - Mild, scattered, gastritis without bleeding in the proximal stomach (likely related to previous radiation). - Normal examined duodenum. - No specimens collected."  Lab results today (11/13/19) of CBC w/diff and CMP is as follows: all values are WNL except for RBC at 3.76, Hgb at 10.4, HCT at 32.9, Glucose at 136, Creatinine at 1.12, Calcium at 8.4, Albumin at 3.2, GFR Est Non Af Am at 47. 11/13/2019 Magnesium at 1.6  On review of systems, pt reports fatigue, swallowing issues, eating well, bruising, pain/soreness in lower right abdomen and denies bowel movement issues, unexpected weight loss and any other symptoms.   MEDICAL HISTORY:  Past Medical History:  Diagnosis Date  . Barrett's esophagus   . Bone neoplasm 06/24/2015  [facility]   metastatic poorly differentiated carcinoma. tumor left groin surgical removal with radiation tx.  . Cataract    BILATERAL  . Cigarette smoker two packs a day or less    Currently still smoking 2  PPD - Not interested in quitting at this time.  . Colitis 2017  . Colon polyps    hyperplastic, tubular adenomas, tubulovillous adenoma  . Cough, persistent    hx. lung cancer ? primary-being evaluated, unsure of primary site.  . Depression 06/24/2015  . Diverticulosis   . Emphysema of lung ([place])   . Endometriosis     Hysterectomy with BSO at age 49 yrs  . Esophageal adenocarcinoma ([place]) 08/11/15   intramucosal  . Gastritis   . GERD (gastroesophageal reflux disease)   . H/O: pneumonia   . Hiatal hernia   . Hyperlipidemia   . Hypertension 06/24/2015   likely improved incidental to 40 lbs weight loss from her neoplasm. No Longer taking med for this as of 08-06-15  . IBS (irritable bowel syndrome)   . Pain    left hip-persistent"tumor of bone"-radiation tx. 10.  . Vitamin D deficiency disease    SURGICAL HISTORY: Past Surgical History:  Procedure Laterality Date  . ABDOMINAL HYSTERECTOMY    . BALLOON DILATION N/A 10/08/2019   Procedure: BALLOON DILATION;  Surgeon: [name], MD;  Location: [facility];  Service: Gastroenterology;  Laterality: N/A;  . BARTHOLIN GLAND CYST EXCISION  78 yo ago   Does not want if it was an infected cyst or tumor. Was soon as delivery  . BIOPSY  01/02/2019   Procedure: BIOPSY;  Surgeon: [name], MD;  Location: [facility];  Service: Gastroenterology;;  . CATARACT EXTRACTION    . COLONOSCOPY W/ POLYPECTOMY     multiple times - last done 09/2014 per patient.  . ESOPHAGOGASTRODUODENOSCOPY (EGD) WITH PROPOFOL N/A 08/11/2015   Procedure: ESOPHAGOGASTRODUODENOSCOPY (EGD) WITH PROPOFOL;  Surgeon: [name], MD;  Location: [facility];  Service: Gastroenterology;  Laterality: N/A;  . ESOPHAGOGASTRODUODENOSCOPY (EGD) WITH PROPOFOL N/A 01/02/2019   Procedure: ESOPHAGOGASTRODUODENOSCOPY (EGD) WITH PROPOFOL;  Surgeon: [name], MD;  Location: [facility];  Service: Gastroenterology;  Laterality: N/A;  . ESOPHAGOGASTRODUODENOSCOPY (EGD) WITH PROPOFOL N/A 10/08/2019   Procedure: ESOPHAGOGASTRODUODENOSCOPY (EGD) WITH PROPOFOL;  Surgeon: [name], MD;  Location: [facility];  Service: Gastroenterology;  Laterality: N/A;  . FLEXIBLE SIGMOIDOSCOPY N/A 06/24/2017   Procedure: FLEXIBLE SIGMOIDOSCOPY;  Surgeon: Manus Gunning, MD;  Location: [facility];  Service:  Gastroenterology;  Laterality: N/A;  . GANGLION CYST EXCISION    . KNEE ARTHROSCOPY  age about 33 yrs  . TONSILLECTOMY    . TOTAL ABDOMINAL HYSTERECTOMY W/ BILATERAL SALPINGOOPHORECTOMY  at age 54 yrs   For endometriosis    SOCIAL HISTORY: Social History   Socioeconomic History  . Marital status: Widowed    Spouse name: Not on file  . Number of children: 2  . Years of education: Not on file  . Highest education level: Not on file  Occupational History  . Not on file  Social Needs  . Financial resource strain: Not on file  . Food insecurity    Worry: Not on file    Inability: Not on file  . Transportation needs    Medical: No    Non-medical: No  Tobacco Use  . Smoking status: Former Smoker    Packs/day: 1.00    Years: 60.00    Pack years: 60.00    Types: Cigarettes    Quit date: 12/05/2014    Years since quitting: 4.9  . Smokeless tobacco: Never Used  Substance and Sexual Activity  . Alcohol use: No    Alcohol/week: 0.0 standard drinks  . Drug use: No  .  Sexual activity: Not Currently  Lifestyle  . Physical activity    Days per week: Not on file    Minutes per session: Not on file  . Stress: Not on file  Relationships  . Social Herbalist on phone: Not on file    Gets together: Not on file    Attends religious service: Not on file    Active member of club or organization: Not on file    Attends meetings of clubs or organizations: Not on file    Relationship status: Not on file  . Intimate partner violence    Fear of current or ex partner: Not on file    Emotionally abused: Not on file    Physically abused: Not on file    Forced sexual activity: Not on file  Other Topics Concern  . Not on file  Social History Narrative  . Not on file    FAMILY HISTORY: Family History  Problem Relation Age of Onset  . Colon cancer Brother   . Colon cancer Brother   . Stroke Mother   . Colon cancer Father   . Emphysema Father        smoked  . Breast cancer  Daughter 89       ER/PR+ stage II    ALLERGIES:  is allergic to penicillins; remeron [mirtazapine]; and latex. patient wonders if she has a penicillin allergy but notes that she is uncertain about this.  MEDICATIONS:  Current Outpatient Medications  Medication Sig Dispense Refill  . Biotin (BIOTIN 5000) 5 MG CAPS Take 5 mg by mouth daily.    . Calcium Citrate-Vitamin D (CALCIUM + D PO) Take 1 tablet by mouth daily.    . Cyanocobalamin (B-12) 2500 MCG TABS Take 2,500 mcg by mouth daily.    [name] FLUoxetine (PROZAC) 20 MG capsule Take 1 capsule (20 mg total) by mouth daily. 90 capsule 3  . lidocaine-prilocaine (EMLA) cream Apply to affected area once (Patient taking differently: Apply 1 application topically as needed (port access). ) 30 g 3  . omeprazole (PRILOSEC) 40 MG capsule TAKE 1 CAPSULE BY MOUTH EVERY DAY (Patient taking differently: Take 40 mg by mouth daily. ) 90 capsule 3  . traZODone (DESYREL) 50 MG tablet TAKE 1 TABLET BY MOUTH EVERYDAY AT BEDTIME (Patient taking differently: Take 50 mg by mouth at bedtime. ) 90 tablet 2  . Vitamin D, Ergocalciferol, (DRISDOL) 1.25 MG (50000 UT) CAPS capsule TAKE 1 CAPSULE BY MOUTH EVERY 7 DAYS (Patient taking differently: Take 50,000 Units by mouth every Wednesday. ) 12 capsule 3  . XARELTO 20 MG TABS tablet TAKE 1 TABLET (20 MG TOTAL) BY MOUTH DAILY WITH SUPPER. (Patient taking differently: Take 20 mg by mouth daily with supper. ) 90 tablet 0   No current facility-administered medications for this visit.    Facility-Administered Medications Ordered in Other Visits  Medication Dose Route Frequency Provider Last Rate Last Dose  . denosumab (XGEVA) injection 120 mg  120 mg Subcutaneous Once [name], MD      . octreotide (SANDOSTATIN LAR) IM injection 30 mg  30 mg Intramuscular Once [name], MD        REVIEW OF SYSTEMS:   A 10+ POINT REVIEW OF SYSTEMS WAS OBTAINED including neurology, dermatology, psychiatry, cardiac,  respiratory, lymph, extremities, GI, GU, Musculoskeletal, constitutional, breasts, reproductive, HEENT.  All pertinent positives are noted in the HPI.  All others are negative.   PHYSICAL EXAMINATION: ECOG PERFORMANCE STATUS:  2 - Symptomatic, <50% confined to bed  Vitals:   11/13/19 1002  BP: (!) 152/69  Pulse: 88  Resp: 18  Temp: 98 F (36.7 C)  SpO2: 97%   Filed Weights   11/13/19 1002  Weight: 136 lb 9.6 oz (62 kg)  .  Wt Readings from Last 3 Encounters:  11/13/19 136 lb 9.6 oz (62 kg)  10/08/19 134 lb 11.2 oz (61.1 kg)  10/02/19 134 lb 11.2 oz (61.1 kg)   GENERAL:alert, in no acute distress and comfortable SKIN: no acute rashes, no significant lesions EYES: conjunctiva are pink and non-injected, sclera anicteric OROPHARYNX: MMM, no exudates, no oropharyngeal erythema or ulceration NECK: supple, no JVD LYMPH:  no palpable lymphadenopathy in the cervical, axillary or inguinal regions LUNGS: clear to auscultation b/l with normal respiratory effort HEART: regular rate & rhythm ABDOMEN:  normoactive bowel sounds, non tender,not distended. No palpable hepatosplenomegaly.  Extremity: no pedal edema PSYCH: alert & oriented x 3 with fluent speech NEURO: no focal motor/sensory deficits  LABORATORY DATA:  I have reviewed the data as listed  . CBC Latest Ref Rng & Units 11/13/2019 10/16/2019 09/18/2019  WBC 4.0 - 10.5 K/uL 4.4 5.8 4.9  Hemoglobin 12.0 - 15.0 g/dL 10.4(L) 10.2(L) 11.3(L)  Hematocrit 36.0 - 46.0 % 32.9(L) 32.1(L) 35.2(L)  Platelets 150 - 400 K/uL 161 169 168   ANC 1.8k . CMP Latest Ref Rng & Units 11/13/2019 10/16/2019 09/18/2019  Glucose 70 - 99 mg/dL 136(H) 88 107(H)  BUN 8 - 23 mg/dL 16 13 18   Creatinine 0.44 - 1.00 mg/dL 1.12(H) 1.00 1.18(H)  Sodium 135 - 145 mmol/L 141 139 140  Potassium 3.5 - 5.1 mmol/L 4.2 4.0 4.1  Chloride 98 - 111 mmol/L 109 108 108  CO2 22 - 32 mmol/L 26 24 24   Calcium 8.9 - 10.3 mg/dL 8.4(L) 8.4(L) 8.9  Total Protein 6.5 - 8.1  g/dL 7.0 7.1 7.6  Total Bilirubin 0.3 - 1.2 mg/dL 0.3 0.3 0.4  Alkaline Phos 38 - 126 U/L 57 66 52  AST 15 - 41 U/L 19 17 27   ALT 0 - 44 U/L 8 6 13        01/02/19 Esophagus Biopsy:    10/08/2019 EGD:     RADIOGRAPHIC STUDIES:  .No results found.  ASSESSMENT & PLAN:   78 y.o. female with  #1 Metastatic poorly differentiated carcinoma with likely lung primary non-small cell lung cancer.   CT of the head with and without contrast showed no evidence of metastatic disease. EGFR blood test mutation analysis negative. CT chest abdomen pelvis 04/19/2016 shows no evidence of disease progression. Patient tolerated Nivolumab very well but was discontinued when she developed grade 2 Immune colitis. Has been off Nivolumab for >6 months  CT chest abdomen pelvis on 06/24/2016 shows no evidence of new disease or progression of metastatic disease. CT chest abdomen pelvis 09/06/2016 shows 1. Mixed interval response to therapy. 2. There is a new left ventral chest wall lesion deep to the pectoralis musculature worrisome for metastatic disease. 3. Posterior lower lobe nodular densities are identified which may reflect areas of pulmonary metastasis. 4. Interval decrease in size of destructive lesion involving the left iliac bone.  CT chest abd pelvis 12/08/2016: Cystic mass involving the left ventral chest wall has resolved in the interval. Likely was a hematoma due to trauma. Interval increase in size of pleural base mass overlying the posterior and inferior left lower lobe. There is also a new left pleural effusion identified.  CT  chest 02/01/2017: Residual irregular soft tissue thickening/volume loss and trace left pleural fluid at the base of the left hemithorax, overall improved in appearance from 12/08/2016. No measurable lesion.  CT chest 05/29/2017 shows no residual pleural based mass or significant pleural effusion in the left hemithorax. No evidence of thoracic metastatic disease. No evidence of  progressive metastatic disease within the abdomen or pelvis. Mixed lytic and blastic lesion involving the left iliac bone and associated pathologic fracture are unchanged.   CT CAP 09/14/17 shows no new changes. She does have slight displacement of her fractured left iliac bone. Evidence of stable disease.   CT CAP 01/04/2018- No new or progressive metastatic disease. Stable large left iliac bone metastasis with associated chronic pathologic fracture.   CT chest/abd/pelvis done on 04/26/18 revealed Stable exam.  No new or progressive interval findings.  07/19/18 CT C/A/P revealed Stable exam.  No new or progressive interval findings. Large destructive left iliac lesion is similar to prior. Aortic Atherosclerosis and Emphysema.    11/06/18 CT C/A/P revealed Similar appearance of large mixed lytic and sclerotic lesion in the left ilium. No new metastatic lesions are otherwise noted elsewhere in the chest, abdomen or pelvis. 2. Interval development of thickening of the distal third of the esophagus. This is nonspecific, and could be related to underlying reflux esophagitis. However, if there is any clinical concern for Barrett's metaplasia or esophageal neoplasia, further evaluation with nonemergent endoscopy could be considered. 3. Aortic atherosclerosis, in addition to left main coronary artery disease. Assessment for potential risk factor modification, dietary therapy or pharmacologic therapy may be warranted, if clinically Indicated. 4. Diffuse bronchial wall thickening with mild to moderate centrilobular and paraseptal emphysema; imaging findings suggestive of underlying COPD. 5. Additional incidental findings, as above.   #2 Newly diagnosed Adenocarcinoma of the esophagus  Barrett's esophagus 4cms in the distal esophagus with low and high-grade dysplasia  01/02/19 Surgical pathology revealed adenocarcinoma of the esophagus   01/25/19 PET/CT revealed Distal esophageal primary, without hypermetabolic  metastatic disease. 2. Chronic left iliac metastasis, as before. 3. Hypermetabolism within and superficial to the right gluteal musculature is most likely related to trauma and/or injection sites. 4. Aortic atherosclerosis, coronary artery atherosclerosis and emphysema.  S/p concurrent Carboplatin and Taxol weekly with RT of 45 Gy in 25 fractions and 5.4 Gy boost, completed between 02/04/19 and 03/27/19  #3 diarrhea-  now resolved was previously. 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 She was previously on on Lialda, budesonide,probiotics and lomotil but not currently taking any of these. Plan -Continue Sandostatin every 4 weeks   #4 h/o diverticulitis and c diff colitis - now resolved Plan  -continue on sandostatin today and q4weeks.  -continue on Lialda  #5 DVT and PE  -continue on Xarelto - no issues with bleeding   #6 Dsypnea 03/14/19 ECHO revealed LV EF of 60-65% 03/06/19 CXR revealed clear lung fields, normal heart size 03/07/19 and 02/25/19 EKGs, no overt concern but some decreased QRS amplitude Did refer to pulmonology for further evaluation and lung function testing  Began steroid inhaler to mitigate possible inflammation in airway, could be some radiation related scarring and emphysema  07/03/2019 PET skull base to thigh revealed "1. Interval response to therapy. Significant reduction in FDG uptake associated with distal esophageal mass. SUV max currently 2.61 versus 16.97 previously. 2. 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  Unchanged 3. Aortic Atherosclerosis (ICD10-I70.0) and Emphysema (ICD10-J43.9). Coronary artery calcifications."  09/27/2019 CT Chest ([id number]) revealed "1. Nonspecific mild circumferential wall thickening in the lower third of the thoracic esophagus extending to the esophagogastric junction, not convincingly changed since  07/03/2019 PET-CT, potentially representing post treatment change with residual tumor not excluded. 2. No recurrent mediastinal adenopathy or other findings of metastatic disease in the chest. 3. Increasing paramediastinal reticulation and ground-glass opacity in the lower lobes, favor evolving post treatment change. Aortic Atherosclerosis (ICD10-I70.0) and Emphysema (ICD10-J43.9)."   PLAN: -Discussed pt labwork today, 11/13/19;  all values are WNL except for RBC at 3.76, Hgb at 10.4, HCT at 32.9, Glucose at 136, Creatinine at 1.12, Calcium at 8.4, Albumin at 3.2, GFR Est Non Af Am at 47. -Discussed 11/13/2019 Magnesium at 1.6 -Discussed 10/08/2019 Upper Endoscopy revealed "No evidence of tumor or mass lesion".  -Will continue monthly Sandostatin -Continue with Xgeva every 8 weeks, no dental concerns at this time -Continue PO Magnesium replacement  -Continue 82m Marinol BID -Continue 2000 units Vitamin D every day, with dose adjustment to maintain 25OH Vit D levels of 40-60 -Continue Trazodone for sleep difficulty -Continue f/u with Dr. PHilarie Fredricksonas needed -Will repeat CT C/A/P in 11 week -Will give IV Injectafer weekly x2 in 1-2 weeks - difficult for pt to take po iron with dysphagia -Will see back in 3 months with labs    FOLLOW UP: -IV injectafer weekly x 2 doses within next 1-2 weeks -continue Sandostatin q4weeks plz schedule next 4 doses -continue Xgeva every 8 weeks with labs - plz schedule next 3 doses. -CT chest/abd/pelvis in 11 weeks -RTC with Dr KIrene Limboin 12 weeks with labs and CT results   The total time spent in the appt was 25 minutes and more than 50% was on counseling and direct patient cares.  All of the patient's questions were answered with apparent satisfaction. The patient knows to call the clinic with any problems, questions or concerns.  GSullivan LoneMD MNew ProvidenceHematology/Oncology Physician [facility] (Office): [phone](Work cell):  [phone](Fax): [phone] I, JYevette Edwards am acting as a scribe for Dr. GSullivan Lone   .I have reviewed the above documentation for accuracy and completeness, and I agree with the above. .Brunetta GeneraMD

## 2019-11-13 NOTE — Patient Instructions (Signed)
Octreotide injection solution What is this medicine? OCTREOTIDE (ok TREE oh tide) is used to reduce blood levels of growth hormone in patients with a condition called acromegaly. This medicine also reduces flushing and watery diarrhea caused by certain types of cancer. This medicine may be used for other purposes; ask your health care provider or pharmacist if you have questions. COMMON BRAND NAME(S): Bynfezia, Sandostatin, Sandostatin LAR What should I tell my health care provider before I take this medicine? They need to know if you have any of these conditions:  diabetes  gallbladder disease  kidney disease  liver disease  an unusual or allergic reaction to octreotide, other medicines, foods, dyes, or preservatives  pregnant or trying to get pregnant  breast-feeding How should I use this medicine? This medicine is for injection under the skin or into a vein (only in emergency situations). It is usually given by a health care professional in a hospital or clinic setting. If you get this medicine at home, you will be taught how to prepare and give this medicine. Allow the injection solution to come to room temperature before use. Do not warm it artificially. Use exactly as directed. Take your medicine at regular intervals. Do not take your medicine more often than directed. It is important that you put your used needles and syringes in a special sharps container. Do not put them in a trash can. If you do not have a sharps container, call your pharmacist or healthcare provider to get one. Talk to your pediatrician regarding the use of this medicine in children. Special care may be needed. Overdosage: If you think you have taken too much of this medicine contact a poison control center or emergency room at once. NOTE: This medicine is only for you. Do not share this medicine with others. What if I miss a dose? If you miss a dose, take it as soon as you can. If it is almost time for your  next dose, take only that dose. Do not take double or extra doses. What may interact with this medicine? Do not take this medicine with any of the following medications:  cisapride  droperidol  general anesthetics  grepafloxacin  perphenazine  thioridazine This medicine may also interact with the following medications:  bromocriptine  cyclosporine  diuretics  medicines for blood pressure, heart disease, irregular heart beat  medicines for diabetes, including insulin  quinidine This list may not describe all possible interactions. Give your health care provider a list of all the medicines, herbs, non-prescription drugs, or dietary supplements you use. Also tell them if you smoke, drink alcohol, or use illegal drugs. Some items may interact with your medicine. What should I watch for while using this medicine? Visit your doctor or health care professional for regular checks on your progress. To help reduce irritation at the injection site, use a different site for each injection and make sure the solution is at room temperature before use. This medicine may cause decreases in blood sugar. Signs of low blood sugar include chills, cool, pale skin or cold sweats, drowsiness, extreme hunger, fast heartbeat, headache, nausea, nervousness or anxiety, shakiness, trembling, unsteadiness, tiredness, or weakness. Contact your doctor or health care professional right away if you experience any of these symptoms. This medicine may increase blood sugar. Ask your healthcare provider if changes in diet or medicines are needed if you have diabetes. This medicine may cause a decrease in vitamin B12. You should make sure that you get enough vitamin B12  while you are taking this medicine. Discuss the foods you eat and the vitamins you take with your health care professional. What side effects may I notice from receiving this medicine? Side effects that you should report to your doctor or health care  professional as soon as possible:  allergic reactions like skin rash, itching or hives, swelling of the face, lips, or tongue  decreases in blood sugar  changes in heart rate  severe stomach pain   signs and symptoms of high blood sugar such as being more thirsty or hungry or having to urinate more than normal. You may also feel very tired or have blurry vision. Side effects that usually do not require medical attention (report to your doctor or health care professional if they continue or are bothersome):  diarrhea or constipation  gas or stomach pain  nausea, vomiting  pain, redness, swelling and irritation at site where injected This list may not describe all possible side effects. Call your doctor for medical advice about side effects. You may report side effects to FDA at 1-800-FDA-1088. Where should I keep my medicine? Keep out of the reach of children. Store in a refrigerator between 2 and 8 degrees C (36 and 46 degrees F). Protect from light. Allow to come to room temperature naturally. Do not use artificial heat. If protected from light, the injection may be stored at room temperature between 20 and 30 degrees C (70 and 86 degrees F) for 14 days. After the initial use, throw away any unused portion of a multiple dose vial after 14 days. Throw away unused portions of the ampules after use. NOTE: This sheet is a summary. It may not cover all possible information. If you have questions about this medicine, talk to your doctor, pharmacist, or health care provider.  2020 [employer] (2018-08-30 08:07:09) Denosumab injection What is this medicine? DENOSUMAB (den oh sue mab) slows bone breakdown. Prolia is used to treat osteoporosis in women after menopause and in men, and in people who are taking corticosteroids for 6 months or more. Delton See is used to treat a high calcium level due to cancer and to prevent bone fractures and other bone problems caused by multiple myeloma or  cancer bone metastases. Delton See is also used to treat giant cell tumor of the bone. This medicine may be used for other purposes; ask your health care provider or pharmacist if you have questions. COMMON BRAND NAME(S): Prolia, XGEVA What should I tell my health care provider before I take this medicine? They need to know if you have any of these conditions:  dental disease  having surgery or tooth extraction  infection  kidney disease  low levels of calcium or Vitamin D in the blood  malnutrition  on hemodialysis  skin conditions or sensitivity  thyroid or parathyroid disease  an unusual reaction to denosumab, other medicines, foods, dyes, or preservatives  pregnant or trying to get pregnant  breast-feeding How should I use this medicine? This medicine is for injection under the skin. It is given by a health care professional in a hospital or clinic setting. A special MedGuide will be given to you before each treatment. Be sure to read this information carefully each time. For Prolia, talk to your pediatrician regarding the use of this medicine in children. Special care may be needed. For Delton See, talk to your pediatrician regarding the use of this medicine in children. While this drug may be prescribed for children as young as 13 years for selected  conditions, precautions do apply. Overdosage: If you think you have taken too much of this medicine contact a poison control center or emergency room at once. NOTE: This medicine is only for you. Do not share this medicine with others. What if I miss a dose? It is important not to miss your dose. Call your doctor or health care professional if you are unable to keep an appointment. What may interact with this medicine? Do not take this medicine with any of the following medications:  other medicines containing denosumab This medicine may also interact with the following medications:  medicines that lower your chance of fighting  infection  steroid medicines like prednisone or cortisone This list may not describe all possible interactions. Give your health care provider a list of all the medicines, herbs, non-prescription drugs, or dietary supplements you use. Also tell them if you smoke, drink alcohol, or use illegal drugs. Some items may interact with your medicine. What should I watch for while using this medicine? Visit your doctor or health care professional for regular checks on your progress. Your doctor or health care professional may order blood tests and other tests to see how you are doing. Call your doctor or health care professional for advice if you get a fever, chills or sore throat, or other symptoms of a cold or flu. Do not treat yourself. This drug may decrease your body's ability to fight infection. Try to avoid being around people who are sick. You should make sure you get enough calcium and vitamin D while you are taking this medicine, unless your doctor tells you not to. Discuss the foods you eat and the vitamins you take with your health care professional. See your dentist regularly. Brush and floss your teeth as directed. Before you have any dental work done, tell your dentist you are receiving this medicine. Do not become pregnant while taking this medicine or for 5 months after stopping it. Talk with your doctor or health care professional about your birth control options while taking this medicine. Women should inform their doctor if they wish to become pregnant or think they might be pregnant. There is a potential for serious side effects to an unborn child. Talk to your health care professional or pharmacist for more information. What side effects may I notice from receiving this medicine? Side effects that you should report to your doctor or health care professional as soon as possible:  allergic reactions like skin rash, itching or hives, swelling of the face, lips, or tongue  bone  pain  breathing problems  dizziness  jaw pain, especially after dental work  redness, blistering, peeling of the skin  signs and symptoms of infection like fever or chills; cough; sore throat; pain or trouble passing urine  signs of low calcium like fast heartbeat, muscle cramps or muscle pain; pain, tingling, numbness in the hands or feet; seizures  unusual bleeding or bruising  unusually weak or tired Side effects that usually do not require medical attention (report to your doctor or health care professional if they continue or are bothersome):  constipation  diarrhea  headache  joint pain  loss of appetite  muscle pain  runny nose  tiredness  upset stomach This list may not describe all possible side effects. Call your doctor for medical advice about side effects. You may report side effects to FDA at 1-800-FDA-1088. Where should I keep my medicine? This medicine is only given in a clinic, doctor's office, or other health care  setting and will not be stored at home. NOTE: This sheet is a summary. It may not cover all possible information. If you have questions about this medicine, talk to your doctor, pharmacist, or health care provider.  2020 [employer] (2018-03-30 16:10:44)

## 2019-11-13 NOTE — Telephone Encounter (Signed)
Per Dr. Irene Limbo ok to give injection with calcium 8.4

## 2019-11-20 ENCOUNTER — Inpatient Hospital Stay: Payer: Medicare Other

## 2019-11-22 ENCOUNTER — Other Ambulatory Visit: Payer: Self-pay

## 2019-11-22 ENCOUNTER — Inpatient Hospital Stay: Payer: Medicare Other

## 2019-11-22 ENCOUNTER — Other Ambulatory Visit: Payer: Self-pay | Admitting: Medical

## 2019-11-22 ENCOUNTER — Ambulatory Visit (HOSPITAL_BASED_OUTPATIENT_CLINIC_OR_DEPARTMENT_OTHER): Payer: Medicare Other | Admitting: Medical

## 2019-11-22 VITALS — BP 181/70 | HR 53 | Temp 98.0°F | Resp 18

## 2019-11-22 DIAGNOSIS — Z95828 Presence of other vascular implants and grafts: Secondary | ICD-10-CM

## 2019-11-22 DIAGNOSIS — Z79899 Other long term (current) drug therapy: Secondary | ICD-10-CM | POA: Diagnosis not present

## 2019-11-22 DIAGNOSIS — I10 Essential (primary) hypertension: Secondary | ICD-10-CM

## 2019-11-22 DIAGNOSIS — K529 Noninfective gastroenteritis and colitis, unspecified: Secondary | ICD-10-CM

## 2019-11-22 DIAGNOSIS — C155 Malignant neoplasm of lower third of esophagus: Secondary | ICD-10-CM | POA: Diagnosis not present

## 2019-11-22 DIAGNOSIS — Z86711 Personal history of pulmonary embolism: Secondary | ICD-10-CM | POA: Diagnosis not present

## 2019-11-22 DIAGNOSIS — Z7901 Long term (current) use of anticoagulants: Secondary | ICD-10-CM | POA: Diagnosis not present

## 2019-11-22 DIAGNOSIS — R197 Diarrhea, unspecified: Secondary | ICD-10-CM | POA: Diagnosis not present

## 2019-11-22 DIAGNOSIS — D500 Iron deficiency anemia secondary to blood loss (chronic): Secondary | ICD-10-CM | POA: Diagnosis not present

## 2019-11-22 DIAGNOSIS — K521 Toxic gastroenteritis and colitis: Secondary | ICD-10-CM

## 2019-11-22 DIAGNOSIS — C3490 Malignant neoplasm of unspecified part of unspecified bronchus or lung: Secondary | ICD-10-CM | POA: Diagnosis not present

## 2019-11-22 DIAGNOSIS — Z87891 Personal history of nicotine dependence: Secondary | ICD-10-CM | POA: Diagnosis not present

## 2019-11-22 DIAGNOSIS — Z7189 Other specified counseling: Secondary | ICD-10-CM

## 2019-11-22 DIAGNOSIS — Z800 Family history of malignant neoplasm of digestive organs: Secondary | ICD-10-CM | POA: Diagnosis not present

## 2019-11-22 DIAGNOSIS — Z86718 Personal history of other venous thrombosis and embolism: Secondary | ICD-10-CM | POA: Diagnosis not present

## 2019-11-22 DIAGNOSIS — C7951 Secondary malignant neoplasm of bone: Secondary | ICD-10-CM | POA: Diagnosis not present

## 2019-11-22 DIAGNOSIS — Z803 Family history of malignant neoplasm of breast: Secondary | ICD-10-CM | POA: Diagnosis not present

## 2019-11-22 MED ORDER — CLONIDINE HCL 0.1 MG PO TABS
ORAL_TABLET | ORAL | Status: AC
  Filled 2019-11-22: qty 1

## 2019-11-22 MED ORDER — SODIUM CHLORIDE 0.9 % IV SOLN
750.0000 mg | Freq: Once | INTRAVENOUS | Status: AC
  Administered 2019-11-22: 750 mg via INTRAVENOUS
  Filled 2019-11-22: qty 15

## 2019-11-22 MED ORDER — HEPARIN SOD (PORK) LOCK FLUSH 100 UNIT/ML IV SOLN
500.0000 [IU] | Freq: Once | INTRAVENOUS | Status: AC
  Administered 2019-11-22: 500 [IU]
  Filled 2019-11-22: qty 5

## 2019-11-22 MED ORDER — CLONIDINE HCL 0.1 MG PO TABS
0.1000 mg | ORAL_TABLET | Freq: Once | ORAL | Status: AC
  Administered 2019-11-22: 0.1 mg via ORAL

## 2019-11-22 MED ORDER — LORATADINE 10 MG PO TABS
ORAL_TABLET | ORAL | Status: AC
  Filled 2019-11-22: qty 1

## 2019-11-22 MED ORDER — ACETAMINOPHEN 325 MG PO TABS
650.0000 mg | ORAL_TABLET | Freq: Once | ORAL | Status: AC
  Administered 2019-11-22: 650 mg via ORAL

## 2019-11-22 MED ORDER — AMLODIPINE BESYLATE 5 MG PO TABS: 5.0000 mg | ORAL_TABLET | Freq: Every day | ORAL | 1 refills | Status: DC

## 2019-11-22 MED ORDER — ACETAMINOPHEN 325 MG PO TABS
ORAL_TABLET | ORAL | Status: AC
  Filled 2019-11-22: qty 2

## 2019-11-22 MED ORDER — SODIUM CHLORIDE 0.9 % IV SOLN
Freq: Once | INTRAVENOUS | Status: AC
  Filled 2019-11-22: qty 250

## 2019-11-22 MED ORDER — SODIUM CHLORIDE 0.9% FLUSH
10.0000 mL | Freq: Once | INTRAVENOUS | Status: AC
  Administered 2019-11-22: 10 mL
  Filled 2019-11-22: qty 10

## 2019-11-22 MED ORDER — LORATADINE 10 MG PO TABS
10.0000 mg | ORAL_TABLET | Freq: Once | ORAL | Status: AC
  Administered 2019-11-22: 10 mg via ORAL

## 2019-11-22 NOTE — Patient Instructions (Signed)

## 2019-11-22 NOTE — Progress Notes (Signed)
Cindy Byrd notified of elevated BP. Catapress ordered see MAR.   Cindy Byrd aware of repeat BP. Prescription sent to Pt's pharmacy by Cindy. Byrd. Pt to f/u w/ PCP regarding HTN. Pt agreeable with plan of care.

## 2019-11-25 ENCOUNTER — Other Ambulatory Visit: Payer: Self-pay | Admitting: Hematology

## 2019-11-26 NOTE — Progress Notes (Signed)
The patient was seen in the infusion room today as she was receiving Injectafer.  Her blood pressure was noted to be 181/70.  She was given clonidine 0.1 mg p.o. x1.  She had previously stopped Norvasc due to improvement in her blood pressure.  She was given a prescription of Norvasc 5 mg once daily today and was told to follow-up with her primary care provider.  She expressed understanding and agreement with this plan.  She will return as scheduled and will return sooner if needed.  Sandi Mealy, MHS, PA-C Physician Assistant

## 2019-11-27 ENCOUNTER — Telehealth: Payer: Self-pay

## 2019-11-27 ENCOUNTER — Inpatient Hospital Stay: Payer: Medicare Other

## 2019-11-27 ENCOUNTER — Other Ambulatory Visit: Payer: Self-pay

## 2019-11-27 VITALS — BP 138/65 | HR 70 | Temp 98.3°F | Resp 18

## 2019-11-27 DIAGNOSIS — Z7189 Other specified counseling: Secondary | ICD-10-CM

## 2019-11-27 DIAGNOSIS — C155 Malignant neoplasm of lower third of esophagus: Secondary | ICD-10-CM

## 2019-11-27 DIAGNOSIS — K529 Noninfective gastroenteritis and colitis, unspecified: Secondary | ICD-10-CM

## 2019-11-27 DIAGNOSIS — K521 Toxic gastroenteritis and colitis: Secondary | ICD-10-CM

## 2019-11-27 DIAGNOSIS — Z7901 Long term (current) use of anticoagulants: Secondary | ICD-10-CM | POA: Diagnosis not present

## 2019-11-27 DIAGNOSIS — R197 Diarrhea, unspecified: Secondary | ICD-10-CM | POA: Diagnosis not present

## 2019-11-27 DIAGNOSIS — Z95828 Presence of other vascular implants and grafts: Secondary | ICD-10-CM

## 2019-11-27 DIAGNOSIS — C3490 Malignant neoplasm of unspecified part of unspecified bronchus or lung: Secondary | ICD-10-CM

## 2019-11-27 DIAGNOSIS — C7951 Secondary malignant neoplasm of bone: Secondary | ICD-10-CM | POA: Diagnosis not present

## 2019-11-27 DIAGNOSIS — Z800 Family history of malignant neoplasm of digestive organs: Secondary | ICD-10-CM | POA: Diagnosis not present

## 2019-11-27 DIAGNOSIS — Z803 Family history of malignant neoplasm of breast: Secondary | ICD-10-CM | POA: Diagnosis not present

## 2019-11-27 DIAGNOSIS — Z86718 Personal history of other venous thrombosis and embolism: Secondary | ICD-10-CM | POA: Diagnosis not present

## 2019-11-27 DIAGNOSIS — D500 Iron deficiency anemia secondary to blood loss (chronic): Secondary | ICD-10-CM | POA: Diagnosis not present

## 2019-11-27 DIAGNOSIS — Z87891 Personal history of nicotine dependence: Secondary | ICD-10-CM | POA: Diagnosis not present

## 2019-11-27 DIAGNOSIS — Z79899 Other long term (current) drug therapy: Secondary | ICD-10-CM | POA: Diagnosis not present

## 2019-11-27 DIAGNOSIS — Z86711 Personal history of pulmonary embolism: Secondary | ICD-10-CM | POA: Diagnosis not present

## 2019-11-27 MED ORDER — LORATADINE 10 MG PO TABS
10.0000 mg | ORAL_TABLET | Freq: Once | ORAL | Status: AC
  Administered 2019-11-27: 10 mg via ORAL

## 2019-11-27 MED ORDER — ALBUTEROL SULFATE (2.5 MG/3ML) 0.083% IN NEBU
2.5000 mg | INHALATION_SOLUTION | Freq: Once | RESPIRATORY_TRACT | Status: DC | PRN
  Filled 2019-11-27: qty 3

## 2019-11-27 MED ORDER — SODIUM CHLORIDE 0.9 % IV SOLN
750.0000 mg | Freq: Once | INTRAVENOUS | Status: AC
  Administered 2019-11-27: 750 mg via INTRAVENOUS
  Filled 2019-11-27: qty 15

## 2019-11-27 MED ORDER — ACETAMINOPHEN 325 MG PO TABS
ORAL_TABLET | ORAL | Status: AC
  Filled 2019-11-27: qty 2

## 2019-11-27 MED ORDER — HEPARIN SOD (PORK) LOCK FLUSH 100 UNIT/ML IV SOLN
500.0000 [IU] | Freq: Once | INTRAVENOUS | Status: DC
  Filled 2019-11-27: qty 5

## 2019-11-27 MED ORDER — SODIUM CHLORIDE 0.9% FLUSH
10.0000 mL | Freq: Once | INTRAVENOUS | Status: DC
  Filled 2019-11-27: qty 10

## 2019-11-27 MED ORDER — ACETAMINOPHEN 325 MG PO TABS
650.0000 mg | ORAL_TABLET | Freq: Once | ORAL | Status: AC
  Administered 2019-11-27: 650 mg via ORAL

## 2019-11-27 MED ORDER — SODIUM CHLORIDE 0.9 % IV SOLN
Freq: Once | INTRAVENOUS | Status: AC
  Filled 2019-11-27: qty 250

## 2019-11-27 MED ORDER — LORATADINE 10 MG PO TABS
ORAL_TABLET | ORAL | Status: AC
  Filled 2019-11-27: qty 1

## 2019-11-27 MED ORDER — EPINEPHRINE 0.3 MG/0.3ML IJ SOAJ: 0.3000 mg | Freq: Once | INTRAMUSCULAR | Status: DC | PRN

## 2019-11-27 MED ORDER — SODIUM CHLORIDE 0.9 % IV SOLN
Freq: Once | INTRAVENOUS | Status: DC | PRN
  Filled 2019-11-27: qty 250

## 2019-11-27 MED ORDER — DIPHENHYDRAMINE HCL 50 MG/ML IJ SOLN: 50.0000 mg | Freq: Once | INTRAMUSCULAR | Status: DC | PRN

## 2019-11-27 MED ORDER — FAMOTIDINE IN NACL 20-0.9 MG/50ML-% IV SOLN: 20.0000 mg | Freq: Once | INTRAVENOUS | Status: DC | PRN

## 2019-11-27 MED ORDER — METHYLPREDNISOLONE SODIUM SUCC 125 MG IJ SOLR: 125.0000 mg | Freq: Once | INTRAMUSCULAR | Status: DC | PRN

## 2019-11-27 NOTE — Telephone Encounter (Signed)
Pt informed.  Pt expressed understanding and is agreeable.  Pt transferred to front desk to schedule appt.  [name], CMA

## 2019-11-27 NOTE — Patient Instructions (Signed)

## 2019-11-27 NOTE — Telephone Encounter (Signed)
-----   Message from [name], NP sent at 11/26/2019  5:46 PM EST ----- Regarding: [place] Uncontrolled BP Good Evening Dorothea Ogle, Can you please call Ms. Fickling and have her schedule in office appt for uncontrolled BP. Lucianne Lei- thanks for the heads up! Sincerely, Valetta Fuller  ----- Message ----- From: Harle Stanford., PA-C Sent: 11/26/2019   5:35 PM EST To: [name], NP

## 2019-12-04 ENCOUNTER — Ambulatory Visit (INDEPENDENT_AMBULATORY_CARE_PROVIDER_SITE_OTHER): Payer: Medicare Other | Admitting: Adult Health

## 2019-12-04 ENCOUNTER — Other Ambulatory Visit: Payer: Self-pay

## 2019-12-04 ENCOUNTER — Encounter: Payer: Self-pay | Admitting: Adult Health

## 2019-12-04 DIAGNOSIS — F17210 Nicotine dependence, cigarettes, uncomplicated: Secondary | ICD-10-CM

## 2019-12-04 DIAGNOSIS — E785 Hyperlipidemia, unspecified: Secondary | ICD-10-CM

## 2019-12-04 DIAGNOSIS — F411 Generalized anxiety disorder: Secondary | ICD-10-CM

## 2019-12-04 DIAGNOSIS — I10 Essential (primary) hypertension: Secondary | ICD-10-CM

## 2019-12-04 DIAGNOSIS — Z0000 Encounter for general adult medical examination without abnormal findings: Secondary | ICD-10-CM

## 2019-12-04 DIAGNOSIS — N189 Chronic kidney disease, unspecified: Secondary | ICD-10-CM

## 2019-12-04 DIAGNOSIS — C3490 Malignant neoplasm of unspecified part of unspecified bronchus or lung: Secondary | ICD-10-CM

## 2019-12-04 DIAGNOSIS — F329 Major depressive disorder, single episode, unspecified: Secondary | ICD-10-CM

## 2019-12-04 DIAGNOSIS — F32A Depression, unspecified: Secondary | ICD-10-CM

## 2019-12-04 MED ORDER — FLUOXETINE HCL 20 MG PO CAPS: 20.0000 mg | ORAL_CAPSULE | Freq: Every day | ORAL | 0 refills | Status: DC

## 2019-12-04 NOTE — Assessment & Plan Note (Signed)
CMP will be drawn next week

## 2019-12-04 NOTE — Assessment & Plan Note (Signed)
11/22/2019-She was receiving infusion and BP was noted to be 181/70 She has previously stopped her CCB due to hypotension. She was re-started on Amlodipine 5mg  QD She has not been checking her BP/HR at home, however states "I feel just fine". Her VSS today- excellent 126/65, HR 75 She denies edema of lower extremities.

## 2019-12-04 NOTE — Assessment & Plan Note (Addendum)
Followed closely by Oncology/Dr. Irene Limbo Last OV 11/13/2019 FOLLOW UP: -IV injectafer weekly x 2 doses within next 1-2 weeks -continue Sandostatin q4weeks plz schedule next 4 doses -continue Xgeva every 8 weeks with labs - plz schedule next 3 doses. -CT chest/abd/pelvis in 11 weeks -RTC with Dr Irene Limbo in 12 weeks with labs and CT results

## 2019-12-04 NOTE — Assessment & Plan Note (Signed)
Fluoxetine 20mg  QD

## 2019-12-04 NOTE — Assessment & Plan Note (Signed)
She reports stable mood, denies SI/HI She is currently on Fluoxetine 20mg  QD She denies symptoms of Serotonin Syndrome (also on Trazodone 50mg  QHS PRN). She reports strong support system- neighbors, local son/daughter and remote family that will call daily to check in on her.

## 2019-12-04 NOTE — Patient Instructions (Signed)
Mediterranean Diet A Mediterranean diet refers to food and lifestyle choices that are based on the traditions of countries located on the [employer]. This way of eating has been shown to help prevent certain conditions and improve outcomes for people who have chronic diseases, like kidney disease and heart disease. What are tips for following this plan? Lifestyle  Cook and eat meals together with your family, when possible.  Drink enough fluid to keep your urine clear or pale yellow.  Be physically active every day. This includes: ? Aerobic exercise like running or swimming. ? Leisure activities like gardening, walking, or housework.  Get 7-8 hours of sleep each night.  If recommended by your health care provider, drink red wine in moderation. This means 1 glass a day for nonpregnant women and 2 glasses a day for men. A glass of wine equals 5 oz (150 mL). Reading food labels   Check the serving size of packaged foods. For foods such as rice and pasta, the serving size refers to the amount of cooked product, not dry.  Check the total fat in packaged foods. Avoid foods that have saturated fat or trans fats.  Check the ingredients list for added sugars, such as corn syrup. Shopping  At the grocery store, buy most of your food from the areas near the walls of the store. This includes: ? Fresh fruits and vegetables (produce). ? Grains, beans, nuts, and seeds. Some of these may be available in unpackaged forms or large amounts (in bulk). ? Fresh seafood. ? Poultry and eggs. ? Low-fat dairy products.  Buy whole ingredients instead of prepackaged foods.  Buy fresh fruits and vegetables in-season from local farmers markets.  Buy frozen fruits and vegetables in resealable bags.  If you do not have access to quality fresh seafood, buy precooked frozen shrimp or canned fish, such as tuna, salmon, or sardines.  Buy small amounts of raw or cooked vegetables, salads, or olives from  the deli or salad bar at your store.  Stock your pantry so you always have certain foods on hand, such as olive oil, canned tuna, canned tomatoes, rice, pasta, and beans. Cooking  Cook foods with extra-virgin olive oil instead of using butter or other vegetable oils.  Have meat as a side dish, and have vegetables or grains as your main dish. This means having meat in small portions or adding small amounts of meat to foods like pasta or stew.  Use beans or vegetables instead of meat in common dishes like chili or lasagna.  Experiment with different cooking methods. Try roasting or broiling vegetables instead of steaming or sauteing them.  Add frozen vegetables to soups, stews, pasta, or rice.  Add nuts or seeds for added healthy fat at each meal. You can add these to yogurt, salads, or vegetable dishes.  Marinate fish or vegetables using olive oil, lemon juice, garlic, and fresh herbs. Meal planning   Plan to eat 1 vegetarian meal one day each week. Try to work up to 2 vegetarian meals, if possible.  Eat seafood 2 or more times a week.  Have healthy snacks readily available, such as: ? Vegetable sticks with hummus. ? [place] yogurt. ? Fruit and nut trail mix.  Eat balanced meals throughout the week. This includes: ? Fruit: 2-3 servings a day ? Vegetables: 4-5 servings a day ? Low-fat dairy: 2 servings a day ? Fish, poultry, or lean meat: 1 serving a day ? Beans and legumes: 2 or more servings a week ?  Nuts and seeds: 1-2 servings a day ? Whole grains: 6-8 servings a day ? Extra-virgin olive oil: 3-4 servings a day  Limit red meat and sweets to only a few servings a month What are my food choices?  Mediterranean diet ? Recommended  Grains: Whole-grain pasta. Brown rice. Bulgar wheat. Polenta. Couscous. Whole-wheat bread. [place].  Vegetables: Artichokes. Beets. Broccoli. Cabbage. Carrots. Eggplant. Green beans. Chard. Kale. Spinach. Onions. Leeks. Peas. Squash.  Tomatoes. Peppers. Radishes.  Fruits: Apples. Apricots. Avocado. Berries. Bananas. Cherries. Dates. Figs. Grapes. Lemons. Melon. Oranges. Peaches. Plums. Pomegranate.  Meats and other protein foods: Beans. Almonds. Sunflower seeds. Pine nuts. Peanuts. [place]. Salmon. Scallops. Shrimp. [place]. Tilapia. Clams. Oysters. Eggs.  Dairy: Low-fat milk. Cheese. Greek yogurt.  Beverages: Water. Red wine. Herbal tea.  Fats and oils: Extra virgin olive oil. Avocado oil. Grape seed oil.  Sweets and desserts: [place] yogurt with honey. Baked apples. Poached pears. Trail mix.  Seasoning and other foods: Basil. Cilantro. Coriander. Cumin. Mint. Parsley. Sage. Rosemary. Tarragon. Garlic. Oregano. Thyme. Pepper. Balsalmic vinegar. Tahini. Hummus. Tomato sauce. Olives. Mushrooms. ? Limit these  Grains: Prepackaged pasta or rice dishes. Prepackaged cereal with added sugar.  Vegetables: Deep fried potatoes (french fries).  Fruits: Fruit canned in syrup.  Meats and other protein foods: Beef. Pork. Lamb. Poultry with skin. Hot dogs. [place] Salines.  Dairy: Ice cream. Sour cream. Whole milk.  Beverages: Juice. Sugar-sweetened soft drinks. Beer. Liquor and spirits.  Fats and oils: Butter. Canola oil. Vegetable oil. Beef fat (tallow). Lard.  Sweets and desserts: Cookies. Cakes. Pies. Candy.  Seasoning and other foods: Mayonnaise. Premade sauces and marinades. The items listed may not be a complete list. Talk with your dietitian about what dietary choices are right for you. Summary  The Mediterranean diet includes both food and lifestyle choices.  Eat a variety of fresh fruits and vegetables, beans, nuts, seeds, and whole grains.  Limit the amount of red meat and sweets that you eat.  Talk with your health care provider about whether it is safe for you to drink red wine in moderation. This means 1 glass a day for nonpregnant women and 2 glasses a day for men. A glass of wine equals 5 oz (150 mL). This information  is not intended to replace advice given to you by your health care provider. Make sure you discuss any questions you have with your health care provider. Document Released: 07/14/2016 Document Revised: 07/21/2016 Document Reviewed: 07/14/2016 Elsevier Patient Education  2020 [place] all medications as directed. Remain well hydrated, follow Mediterranean diet. Continue close follow-up with Oncology. Please schedule fasting lab appt Monday, regular follow-up in 6 months. Continue to social distance and wear a mask when in public. GREAT TO SEE YOU!

## 2019-12-04 NOTE — Assessment & Plan Note (Signed)
>>  ASSESSMENT AND PLAN FOR CHRONIC KIDNEY DISEASE WRITTEN ON 12/04/2019  5:49 PM BY DANFORD, KATY D, NP  CMP will be drawn next week

## 2019-12-04 NOTE — Assessment & Plan Note (Signed)
Continue all medications as directed. Remain well hydrated, follow Mediterranean diet. Continue close follow-up with Oncology. Please schedule fasting lab appt Monday, regular follow-up in 6 months. Continue to social distance and wear a mask when in public.

## 2019-12-04 NOTE — Progress Notes (Signed)
Subjective:    Patient ID: Cindy Byrd, female    DOB: Jul 06, 1941, 78 y.o.   MRN: [id number]  HPI:  Cindy Byrd is here for f/u: uncontrolled HTN 11/22/2019-She was receiving infusion and BP was noted to be 181/70 She has previously stopped her CCB due to hypotension. She was re-started on Amlodipine 5mg  QD She has not been checking her BP/HR at home, however states "I feel just fine". Her VSS today- excellent 126/65, HR 75 She denies edema of lower extremities. She continues to experience dysphagia, especially if she eats/drinks too large volume- she has learned how to moderate her intake to avoid distress, She reports stable mood, denies SI/HI She is currently on Fluoxetine 20mg  QD She denies symptoms of Serotonin Syndrome (also on Trazodone 50mg  QHS PRN). She reports strong support system- neighbors, local son/daughter and remote family that will call daily to check in on her. She will return next week for fasting labs. She denies acute cardiac sx's, increase in dyspnea She continues to be closely followed by Oncology   Depression screen [facility] 2/9 12/04/2019 06/04/2019 12/03/2018  Decreased Interest 1 0 2  Down, Depressed, Hopeless - 1 1  PHQ - 2 Score 1 1 3   Altered sleeping 1 0 3  Tired, decreased energy 2 3 1   Change in appetite 1 0 0  Feeling bad or failure about yourself  1 0 1  Trouble concentrating 1 0 2  Moving slowly or fidgety/restless 1 0 3  Suicidal thoughts 0 0 0  PHQ-9 Score 8 4 13   Difficult doing work/chores Somewhat difficult Somewhat difficult Somewhat difficult  Some recent data might be hidden   Patient Care Team    Relationship Specialty Notifications Start End  [name], NP PCP - General Family Medicine  01/15/18   [name], MD Consulting Physician Hematology and Oncology Admissions 06/24/15     Patient Active Problem List   Diagnosis Date Noted  . History of esophageal cancer   . Esophageal dysphagia   . Abnormal esophagram    . DOE (dyspnea on exertion) 04/15/2019  . Malignant neoplasm of distal third of esophagus ([place]) 01/14/2019  . Counseling regarding advance care planning and goals of care 01/14/2019  . Abnormal finding on imaging   . Esophageal mass   . GAD (generalized anxiety disorder) 10/24/2018  . Hyperlipidemia 04/23/2018  . Chronic kidney disease 04/23/2018  . Elevated TSH 04/17/2018  . Cough 02/21/2018  . Elevated serum creatinine 02/21/2018  . Health care maintenance 01/15/2018  . Malignant neoplasm of lung ([place])   . Colitis 06/23/2017  . Left lower quadrant pain 06/23/2017  . Fracture of left iliac crest ([place]) 06/23/2017  . Metastatic lung cancer (metastasis from lung to other site), unspecified laterality ([place])   . Hypocalcemia 09/08/2016  . Vitamin D deficiency 09/08/2016  . New onset a-fib ([place]) 09/08/2016  . Chest pain   . Thrush of mouth and esophagus ([place])   . Protein-calorie malnutrition, severe ([place])   . Colitis determined by colorectal biopsy   . Bilateral pulmonary embolism ([place]) 09/07/2016  . DVT of lower extremity, bilateral ([place]) 09/07/2016  . Palliative care by specialist   . Goals of care, counseling/discussion   . Advance care planning   . Diarrhea   . Ulcerative pancolitis without complication ([place])   . Cancer ([place])   . Pressure injury of skin 09/02/2016  . HCAP (healthcare-associated pneumonia) 09/01/2016  . ARF (acute renal failure) ([place]) 09/01/2016  . Nausea with  vomiting 07/18/2016  . Dehydration 07/18/2016  . Hypokalemia 07/18/2016  . Hypoalbuminemia due to protein-calorie malnutrition ([place]) 07/18/2016  . Peripheral edema 07/18/2016  . Diarrhea due to drug 05/19/2016  . Port catheter in place 04/07/2016  . Nicotine addiction 01/19/2016  . Barrett's esophagus determined by biopsy 09/04/2015  . Gastritis   . Primary malignant neoplasm of lung metastatic to other site ([place]) 08/06/2015  . Esophageal reflux 08/04/2015  . Bone metastasis ([place]) 06/24/2015  .  Neoplasm related pain 06/24/2015  . Protein calorie malnutrition ([place]) 06/24/2015  . Anorexia 06/24/2015  . Heavy smoker (more than 20 cigarettes per day) 06/24/2015  . Depression 06/24/2015  . HTN (hypertension) 06/24/2015     Past Medical History:  Diagnosis Date  . Barrett's esophagus   . Bone neoplasm 06/24/2015  . Cancer [facility])    metastatic poorly differentiated carcinoma. tumor left groin surgical removal with radiation tx.  . Cataract    BILATERAL  . Cigarette smoker two packs a day or less    Currently still smoking 2 PPD - Not interested in quitting at this time.  . Colitis 2017  . Colon polyps    hyperplastic, tubular adenomas, tubulovillous adenoma  . Cough, persistent    hx. lung cancer ? primary-being evaluated, unsure of primary site.  . Depression 06/24/2015  . Diverticulosis   . Emphysema of lung ([place])   . Endometriosis    Hysterectomy with BSO at age 23 yrs  . Esophageal adenocarcinoma ([place]) 08/11/15   intramucosal  . Gastritis   . GERD (gastroesophageal reflux disease)   . H/O: pneumonia   . Hiatal hernia   . Hyperlipidemia   . Hypertension 06/24/2015   likely improved incidental to 40 lbs weight loss from her neoplasm. No Longer taking med for this as of 08-06-15  . IBS (irritable bowel syndrome)   . Pain    left hip-persistent"tumor of bone"-radiation tx. 10.  . Vitamin D deficiency disease      Past Surgical History:  Procedure Laterality Date  . ABDOMINAL HYSTERECTOMY    . BALLOON DILATION N/A 10/08/2019   Procedure: BALLOON DILATION;  Surgeon: [name], MD;  Location: [facility];  Service: Gastroenterology;  Laterality: N/A;  . BARTHOLIN GLAND CYST EXCISION  78 yo ago   Does not want if it was an infected cyst or tumor. Was soon as delivery  . BIOPSY  01/02/2019   Procedure: BIOPSY;  Surgeon: [name], MD;  Location: [facility];  Service: Gastroenterology;;  . CATARACT EXTRACTION    . COLONOSCOPY W/ POLYPECTOMY     multiple times -  last done 09/2014 per patient.  . ESOPHAGOGASTRODUODENOSCOPY (EGD) WITH PROPOFOL N/A 08/11/2015   Procedure: ESOPHAGOGASTRODUODENOSCOPY (EGD) WITH PROPOFOL;  Surgeon: [name], MD;  Location: [facility];  Service: Gastroenterology;  Laterality: N/A;  . ESOPHAGOGASTRODUODENOSCOPY (EGD) WITH PROPOFOL N/A 01/02/2019   Procedure: ESOPHAGOGASTRODUODENOSCOPY (EGD) WITH PROPOFOL;  Surgeon: [name], MD;  Location: [facility];  Service: Gastroenterology;  Laterality: N/A;  . ESOPHAGOGASTRODUODENOSCOPY (EGD) WITH PROPOFOL N/A 10/08/2019   Procedure: ESOPHAGOGASTRODUODENOSCOPY (EGD) WITH PROPOFOL;  Surgeon: [name], MD;  Location: [facility];  Service: Gastroenterology;  Laterality: N/A;  . FLEXIBLE SIGMOIDOSCOPY N/A 06/24/2017   Procedure: FLEXIBLE SIGMOIDOSCOPY;  Surgeon: Manus Gunning, MD;  Location: [facility];  Service: Gastroenterology;  Laterality: N/A;  . GANGLION CYST EXCISION    . KNEE ARTHROSCOPY  age about 72 yrs  . TONSILLECTOMY    . TOTAL ABDOMINAL HYSTERECTOMY W/  BILATERAL SALPINGOOPHORECTOMY  at age 54 yrs   For endometriosis     Family History  Problem Relation Age of Onset  . Colon cancer Brother   . Colon cancer Brother   . Stroke Mother   . Colon cancer Father   . Emphysema Father        smoked  . Breast cancer Daughter 57       ER/PR+ stage II     Social History   Substance and Sexual Activity  Drug Use No     Social History   Substance and Sexual Activity  Alcohol Use No  . Alcohol/week: 0.0 standard drinks     Social History   Tobacco Use  Smoking Status Former Smoker  . Packs/day: 1.00  . Years: 60.00  . Pack years: 60.00  . Types: Cigarettes  . Quit date: 12/05/2014  . Years since quitting: 5.0  Smokeless Tobacco Never Used     Outpatient Encounter Medications as of 12/04/2019  Medication Sig  . amLODipine (NORVASC) 5 MG tablet Take 1 tablet (5 mg total) by mouth daily.  . Biotin (BIOTIN 5000) 5 MG CAPS Take 5 mg  by mouth daily.  . Calcium Citrate-Vitamin D (CALCIUM + D PO) Take 1 tablet by mouth daily.  . Cyanocobalamin (B-12) 2500 MCG TABS Take 2,500 mcg by mouth daily.  [name] FLUoxetine (PROZAC) 20 MG capsule Take 1 capsule (20 mg total) by mouth daily.  [name] lidocaine-prilocaine (EMLA) cream Apply to affected area once (Patient taking differently: Apply 1 application topically as needed (port access). )  . omeprazole (PRILOSEC) 40 MG capsule TAKE 1 CAPSULE BY MOUTH EVERY DAY (Patient taking differently: Take 40 mg by mouth daily. )  . traZODone (DESYREL) 50 MG tablet TAKE 1 TABLET BY MOUTH EVERYDAY AT BEDTIME (Patient taking differently: Take 50 mg by mouth at bedtime. )  . Vitamin D, Ergocalciferol, (DRISDOL) 1.25 MG (50000 UT) CAPS capsule TAKE 1 CAPSULE BY MOUTH EVERY 7 DAYS (Patient taking differently: Take 50,000 Units by mouth every Wednesday. )  . XARELTO 20 MG TABS tablet TAKE 1 TABLET (20 MG TOTAL) BY MOUTH DAILY WITH SUPPER.  . [DISCONTINUED] FLUoxetine (PROZAC) 20 MG capsule Take 1 capsule (20 mg total) by mouth daily.   No facility-administered encounter medications on file as of 12/04/2019.    Allergies: Penicillins, Remeron [mirtazapine], and Latex  Body mass index is 21.39 kg/m.  Blood pressure 126/65, pulse 75, temperature 98.5 F (36.9 C), temperature source Oral, height 5\' 8"  (1.727 m), weight 140 lb 11.2 oz (63.8 kg), SpO2 98 %.     Review of Systems  Constitutional: Positive for fatigue. Negative for activity change, appetite change, chills, diaphoresis, fever and unexpected weight change.  HENT: Negative for congestion.   Eyes: Negative for visual disturbance.  Respiratory: Positive for shortness of breath. Negative for cough, chest tightness and wheezing.   Cardiovascular: Negative for chest pain, palpitations and leg swelling.  Gastrointestinal: Negative for abdominal distention, anal bleeding, blood in stool, constipation, diarrhea, nausea and vomiting.  Endocrine:  Negative for polydipsia, polyphagia and polyuria.  Genitourinary: Negative for difficulty urinating and flank pain.  Musculoskeletal: Negative for arthralgias, back pain, gait problem, joint swelling, myalgias, neck pain and neck stiffness.  Neurological: Negative for dizziness and headaches.  Hematological: Negative for adenopathy. Does not bruise/bleed easily.  Psychiatric/Behavioral: Positive for dysphoric mood. Negative for agitation, behavioral problems, confusion, decreased concentration, hallucinations, self-injury, sleep disturbance and suicidal ideas. The patient is not nervous/anxious and is not hyperactive.  Objective:   Physical Exam Vitals and nursing note reviewed.  Constitutional:      General: She is not in acute distress.    Appearance: Normal appearance. She is normal weight. She is not ill-appearing, toxic-appearing or diaphoretic.  HENT:     Head: Normocephalic and atraumatic.  Eyes:     Extraocular Movements: Extraocular movements intact.     Conjunctiva/sclera: Conjunctivae normal.     Pupils: Pupils are equal, round, and reactive to light.  Cardiovascular:     Rate and Rhythm: Normal rate and regular rhythm.     Pulses: Normal pulses.     Heart sounds: Normal heart sounds. No murmur. No friction rub. No gallop.   Pulmonary:     Effort: Pulmonary effort is normal. No respiratory distress.     Breath sounds: Normal breath sounds. No stridor. No wheezing, rhonchi or rales.  Chest:     Chest wall: No tenderness.  Skin:    General: Skin is warm and dry.     Capillary Refill: Capillary refill takes less than 2 seconds.  Neurological:     Mental Status: She is alert and oriented to person, place, and time.     Coordination: Coordination normal.  Psychiatric:        Mood and Affect: Mood normal.        Behavior: Behavior normal.        Thought Content: Thought content normal.        Judgment: Judgment normal.        Assessment & Plan:   1.  Hyperlipidemia, unspecified hyperlipidemia type   2. GAD (generalized anxiety disorder)   3. Health care maintenance   4. Heavy smoker (more than 20 cigarettes per day)   5. Depression, unspecified depression type   6. Essential hypertension   7. Chronic kidney disease, unspecified CKD stage   8. Metastatic lung cancer (metastasis from lung to other site), unspecified laterality (H[facility])     Hyperlipidemia Needs fasting labs   GAD (generalized anxiety disorder) Fluoxetine 20mg  QD  Health care maintenance Continue all medications as directed. Remain well hydrated, follow Mediterranean diet. Continue close follow-up with Oncology. Please schedule fasting lab appt Monday, regular follow-up in 6 months. Continue to social distance and wear a mask when in public.  Heavy smoker (more than 20 cigarettes per day) Remains tobacco free  Depression She reports stable mood, denies SI/HI She is currently on Fluoxetine 20mg  QD She denies symptoms of Serotonin Syndrome (also on Trazodone 50mg  QHS PRN). She reports strong support system- neighbors, local son/daughter and remote family that will call daily to check in on her.  HTN (hypertension) 11/22/2019-She was receiving infusion and BP was noted to be 181/70 She has previously stopped her CCB due to hypotension. She was re-started on Amlodipine 5mg  QD She has not been checking her BP/HR at home, however states "I feel just fine". Her VSS today- excellent 126/65, HR 75 She denies edema of lower extremities.  Chronic kidney disease CMP will be drawn next week   Metastatic lung cancer (metastasis from lung to other site), unspecified laterality ([place]) Followed closely by Oncology/Dr. Irene Limbo Last OV 11/13/2019 FOLLOW UP: -IV injectafer weekly x 2 doses within next 1-2 weeks -continue Sandostatin q4weeks plz schedule next 4 doses -continue Xgeva every 8 weeks with labs - plz schedule next 3 doses. -CT chest/abd/pelvis in 11 weeks -RTC with  Dr Irene Limbo in 12 weeks with labs and CT results    FOLLOW-UP:  Return in about  6 months (around 06/03/2020) for Regular Follow Up.

## 2019-12-04 NOTE — Assessment & Plan Note (Signed)
Remains tobacco free.   

## 2019-12-04 NOTE — Assessment & Plan Note (Signed)
Needs fasting labs

## 2019-12-10 ENCOUNTER — Other Ambulatory Visit: Payer: Self-pay | Admitting: *Deleted

## 2019-12-10 DIAGNOSIS — C155 Malignant neoplasm of lower third of esophagus: Secondary | ICD-10-CM

## 2019-12-10 DIAGNOSIS — C3491 Malignant neoplasm of unspecified part of right bronchus or lung: Secondary | ICD-10-CM

## 2019-12-11 ENCOUNTER — Inpatient Hospital Stay: Payer: Medicare Other

## 2019-12-11 ENCOUNTER — Other Ambulatory Visit: Payer: Self-pay | Admitting: *Deleted

## 2019-12-11 ENCOUNTER — Other Ambulatory Visit: Payer: Self-pay | Admitting: Hematology

## 2019-12-11 ENCOUNTER — Other Ambulatory Visit: Payer: Self-pay

## 2019-12-11 ENCOUNTER — Inpatient Hospital Stay: Payer: Medicare Other | Attending: Hematology

## 2019-12-11 VITALS — BP 131/54 | HR 65 | Temp 98.2°F | Resp 18

## 2019-12-11 DIAGNOSIS — Z7901 Long term (current) use of anticoagulants: Secondary | ICD-10-CM | POA: Diagnosis not present

## 2019-12-11 DIAGNOSIS — K521 Toxic gastroenteritis and colitis: Secondary | ICD-10-CM

## 2019-12-11 DIAGNOSIS — R197 Diarrhea, unspecified: Secondary | ICD-10-CM

## 2019-12-11 DIAGNOSIS — Z800 Family history of malignant neoplasm of digestive organs: Secondary | ICD-10-CM | POA: Insufficient documentation

## 2019-12-11 DIAGNOSIS — K529 Noninfective gastroenteritis and colitis, unspecified: Secondary | ICD-10-CM

## 2019-12-11 DIAGNOSIS — C7951 Secondary malignant neoplasm of bone: Secondary | ICD-10-CM | POA: Insufficient documentation

## 2019-12-11 DIAGNOSIS — Z803 Family history of malignant neoplasm of breast: Secondary | ICD-10-CM | POA: Diagnosis not present

## 2019-12-11 DIAGNOSIS — I10 Essential (primary) hypertension: Secondary | ICD-10-CM | POA: Insufficient documentation

## 2019-12-11 DIAGNOSIS — Z86711 Personal history of pulmonary embolism: Secondary | ICD-10-CM | POA: Diagnosis not present

## 2019-12-11 DIAGNOSIS — Z86718 Personal history of other venous thrombosis and embolism: Secondary | ICD-10-CM | POA: Diagnosis not present

## 2019-12-11 DIAGNOSIS — Z90710 Acquired absence of both cervix and uterus: Secondary | ICD-10-CM | POA: Diagnosis not present

## 2019-12-11 DIAGNOSIS — Z923 Personal history of irradiation: Secondary | ICD-10-CM | POA: Diagnosis not present

## 2019-12-11 DIAGNOSIS — C3491 Malignant neoplasm of unspecified part of right bronchus or lung: Secondary | ICD-10-CM

## 2019-12-11 DIAGNOSIS — Z95828 Presence of other vascular implants and grafts: Secondary | ICD-10-CM

## 2019-12-11 DIAGNOSIS — C3490 Malignant neoplasm of unspecified part of unspecified bronchus or lung: Secondary | ICD-10-CM | POA: Insufficient documentation

## 2019-12-11 DIAGNOSIS — Z823 Family history of stroke: Secondary | ICD-10-CM | POA: Insufficient documentation

## 2019-12-11 DIAGNOSIS — Z7189 Other specified counseling: Secondary | ICD-10-CM

## 2019-12-11 DIAGNOSIS — D500 Iron deficiency anemia secondary to blood loss (chronic): Secondary | ICD-10-CM

## 2019-12-11 DIAGNOSIS — C155 Malignant neoplasm of lower third of esophagus: Secondary | ICD-10-CM

## 2019-12-11 DIAGNOSIS — F17210 Nicotine dependence, cigarettes, uncomplicated: Secondary | ICD-10-CM | POA: Insufficient documentation

## 2019-12-11 DIAGNOSIS — Z79899 Other long term (current) drug therapy: Secondary | ICD-10-CM | POA: Insufficient documentation

## 2019-12-11 LAB — CMP (CANCER CENTER ONLY)
ALT: 14 U/L (ref 0–44)
AST: 25 U/L (ref 15–41)
Albumin: 3.6 g/dL (ref 3.5–5.0)
Alkaline Phosphatase: 58 U/L (ref 38–126)
Anion gap: 5 (ref 5–15)
BUN: 13 mg/dL (ref 8–23)
CO2: 22 mmol/L (ref 22–32)
Calcium: 8 mg/dL — ABNORMAL LOW (ref 8.9–10.3)
Chloride: 112 mmol/L — ABNORMAL HIGH (ref 98–111)
Creatinine: 0.95 mg/dL (ref 0.44–1.00)
GFR, Est AFR Am: >60 mL/min (ref >60)
GFR, Estimated: 57 mL/min — ABNORMAL LOW (ref >60)
Glucose, Bld: 107 mg/dL — ABNORMAL HIGH (ref 70–99)
Potassium: 4.5 mmol/L (ref 3.5–5.1)
Sodium: 139 mmol/L (ref 135–145)
Total Bilirubin: 0.5 mg/dL (ref 0.3–1.2)
Total Protein: 7.4 g/dL (ref 6.5–8.1)

## 2019-12-11 LAB — CBC WITH DIFFERENTIAL (CANCER CENTER ONLY)
Abs Immature Granulocytes: 0.02 10*3/uL (ref 0.00–0.07)
Basophils Absolute: 0.1 10*3/uL (ref 0.0–0.1)
Basophils Relative: 1 %
Eosinophils Absolute: 0.1 10*3/uL (ref 0.0–0.5)
Eosinophils Relative: 2 %
HCT: 36.8 % (ref 36.0–46.0)
Hemoglobin: 11.5 g/dL — ABNORMAL LOW (ref 12.0–15.0)
Immature Granulocytes: 0 %
Lymphocytes Relative: 19 %
Lymphs Abs: 1 10*3/uL (ref 0.7–4.0)
MCH: 28.3 pg (ref 26.0–34.0)
MCHC: 31.3 g/dL (ref 30.0–36.0)
MCV: 90.4 fL (ref 80.0–100.0)
Monocytes Absolute: 0.5 10*3/uL (ref 0.1–1.0)
Monocytes Relative: 9 %
Neutro Abs: 3.6 10*3/uL (ref 1.7–7.7)
Neutrophils Relative %: 69 %
Platelet Count: 169 10*3/uL (ref 150–400)
RBC: 4.07 MIL/uL (ref 3.87–5.11)
RDW: 18 % — ABNORMAL HIGH (ref 11.5–15.5)
WBC Count: 5.3 10*3/uL (ref 4.0–10.5)
nRBC: 0 % (ref 0.0–0.2)

## 2019-12-11 LAB — FERRITIN: Ferritin: 403 ng/mL — ABNORMAL HIGH (ref 11–307)

## 2019-12-11 LAB — MAGNESIUM: Magnesium: 1.8 mg/dL (ref 1.7–2.4)

## 2019-12-11 MED ORDER — OCTREOTIDE ACETATE 30 MG IM KIT
30.0000 mg | PACK | Freq: Once | INTRAMUSCULAR | Status: AC
  Administered 2019-12-11: 11:00:00 30 mg via INTRAMUSCULAR

## 2019-12-11 MED ORDER — HEPARIN SOD (PORK) LOCK FLUSH 100 UNIT/ML IV SOLN
500.0000 [IU] | Freq: Once | INTRAVENOUS | Status: AC
  Administered 2019-12-11: 11:00:00 500 [IU]
  Filled 2019-12-11: qty 5

## 2019-12-11 MED ORDER — OCTREOTIDE ACETATE 30 MG IM KIT
PACK | INTRAMUSCULAR | Status: AC
  Filled 2019-12-11: qty 1

## 2019-12-11 MED ORDER — SODIUM CHLORIDE 0.9% FLUSH
10.0000 mL | Freq: Once | INTRAVENOUS | Status: AC
  Administered 2019-12-11: 10 mL
  Filled 2019-12-11: qty 10

## 2019-12-11 NOTE — Patient Instructions (Signed)
Octreotide injection solution What is this medicine? OCTREOTIDE (ok TREE oh tide) is used to reduce blood levels of growth hormone in patients with a condition called acromegaly. This medicine also reduces flushing and watery diarrhea caused by certain types of cancer. This medicine may be used for other purposes; ask your health care provider or pharmacist if you have questions. COMMON BRAND NAME(S): Bynfezia, Sandostatin What should I tell my health care provider before I take this medicine? They need to know if you have any of these conditions:  diabetes  gallbladder disease  kidney disease  liver disease  thyroid disease  an unusual or allergic reaction to octreotide, other medicines, foods, dyes, or preservatives  pregnant or trying to get pregnant  breast-feeding How should I use this medicine? This medicine is for injection under the skin or into a vein (only in emergency situations). It is usually given by a health care professional in a hospital or clinic setting. If you get this medicine at home, you will be taught how to prepare and give this medicine. Allow the injection solution to come to room temperature before use. Do not warm it artificially. Use exactly as directed. Take your medicine at regular intervals. Do not take your medicine more often than directed. It is important that you put your used needles and syringes in a special sharps container. Do not put them in a trash can. If you do not have a sharps container, call your pharmacist or healthcare provider to get one. Talk to your pediatrician regarding the use of this medicine in children. Special care may be needed. Overdosage: If you think you have taken too much of this medicine contact a poison control center or emergency room at once. NOTE: This medicine is only for you. Do not share this medicine with others. What if I miss a dose? If you miss a dose, take it as soon as you can. If it is almost time for your  next dose, take only that dose. Do not take double or extra doses. What may interact with this medicine?  bromocriptine  certain medicines for blood pressure, heart disease, irregular heartbeat  cyclosporine  diuretics  medicines for diabetes, including insulin  quinidine This list may not describe all possible interactions. Give your health care provider a list of all the medicines, herbs, non-prescription drugs, or dietary supplements you use. Also tell them if you smoke, drink alcohol, or use illegal drugs. Some items may interact with your medicine. What should I watch for while using this medicine? Visit your doctor or health care professional for regular checks on your progress. To help reduce irritation at the injection site, use a different site for each injection and make sure the solution is at room temperature before use. This medicine may cause decreases in blood sugar. Signs of low blood sugar include chills, cool, pale skin or cold sweats, drowsiness, extreme hunger, fast heartbeat, headache, nausea, nervousness or anxiety, shakiness, trembling, unsteadiness, tiredness, or weakness. Contact your doctor or health care professional right away if you experience any of these symptoms. This medicine may increase blood sugar. Ask your healthcare provider if changes in diet or medicines are needed if you have diabetes. This medicine may cause a decrease in vitamin B12. You should make sure that you get enough vitamin B12 while you are taking this medicine. Discuss the foods you eat and the vitamins you take with your health care professional. What side effects may I notice from receiving this medicine? Side   effects that you should report to your doctor or health care professional as soon as possible:  allergic reactions like skin rash, itching or hives, swelling of the face, lips, or tongue  fast, slow, or irregular heartbeat  right upper belly pain  severe stomach pain  signs  and symptoms of high blood sugar such as being more thirsty or hungry or having to urinate more than normal. You may also feel very tired or have blurry vision.  signs and symptoms of low blood sugar such as feeling anxious; confusion; dizziness; increased hunger; unusually weak or tired; increased sweating; shakiness; cold, clammy skin; irritable; headache; blurred vision; fast heartbeat; loss of consciousness  unusually weak or tired Side effects that usually do not require medical attention (report to your doctor or health care professional if they continue or are bothersome):  diarrhea  dizziness  gas  headache  nausea, vomiting  pain, redness, or irritation at site where injected  upset stomach This list may not describe all possible side effects. Call your doctor for medical advice about side effects. You may report side effects to FDA at 1-800-FDA-1088. Where should I keep my medicine? Keep out of the reach of children. Store in a refrigerator between 2 and 8 degrees C (36 and 46 degrees F). Protect from light. Allow to come to room temperature naturally. Do not use artificial heat. If protected from light, the injection may be stored at room temperature between 20 and 30 degrees C (70 and 86 degrees F) for 14 days. After the initial use, throw away any unused portion of a multiple dose vial after 14 days. Throw away unused portions of the ampules after use. NOTE: This sheet is a summary. It may not cover all possible information. If you have questions about this medicine, talk to your doctor, pharmacist, or health care provider.  2020 Elsevier/Gold Standard (2019-06-20 13:33:09)  

## 2019-12-13 ENCOUNTER — Other Ambulatory Visit: Payer: Self-pay

## 2019-12-13 ENCOUNTER — Other Ambulatory Visit: Payer: Medicare Other

## 2019-12-13 DIAGNOSIS — Z0000 Encounter for general adult medical examination without abnormal findings: Secondary | ICD-10-CM

## 2019-12-13 DIAGNOSIS — I10 Essential (primary) hypertension: Secondary | ICD-10-CM

## 2019-12-13 DIAGNOSIS — E785 Hyperlipidemia, unspecified: Secondary | ICD-10-CM

## 2019-12-13 DIAGNOSIS — R7989 Other specified abnormal findings of blood chemistry: Secondary | ICD-10-CM

## 2019-12-14 LAB — CBC WITH DIFFERENTIAL/PLATELET
Basophils Absolute: 0.1 10*3/uL (ref 0.0–0.2)
Basos: 1 %
EOS (ABSOLUTE): 0.1 10*3/uL (ref 0.0–0.4)
Eos: 2 %
Hematocrit: 37.9 % (ref 34.0–46.6)
Hemoglobin: 11.9 g/dL (ref 11.1–15.9)
Immature Grans (Abs): 0 10*3/uL (ref 0.0–0.1)
Immature Granulocytes: 0 %
Lymphocytes Absolute: 1 10*3/uL (ref 0.7–3.1)
Lymphs: 18 %
MCH: 27.9 pg (ref 26.6–33.0)
MCHC: 31.4 g/dL — ABNORMAL LOW (ref 31.5–35.7)
MCV: 89 fL (ref 79–97)
Monocytes Absolute: 0.5 10*3/uL (ref 0.1–0.9)
Monocytes: 9 %
Neutrophils Absolute: 3.9 10*3/uL (ref 1.4–7.0)
Neutrophils: 70 %
Platelets: 187 10*3/uL (ref 150–450)
RBC: 4.27 x10E6/uL (ref 3.77–5.28)
RDW: 17 % — ABNORMAL HIGH (ref 11.7–15.4)
WBC: 5.6 10*3/uL (ref 3.4–10.8)

## 2019-12-14 LAB — LIPID PANEL
Chol/HDL Ratio: 3.3 ratio (ref 0.0–4.4)
Cholesterol, Total: 154 mg/dL (ref 100–199)
HDL: 46 mg/dL (ref >39)
LDL Chol Calc (NIH): 89 mg/dL (ref 0–99)
Triglycerides: 101 mg/dL (ref 0–149)
VLDL Cholesterol Cal: 19 mg/dL (ref 5–40)

## 2019-12-14 LAB — COMPREHENSIVE METABOLIC PANEL
ALT: 13 IU/L (ref 0–32)
AST: 23 IU/L (ref 0–40)
Albumin/Globulin Ratio: 1.2 (ref 1.2–2.2)
Albumin: 3.9 g/dL (ref 3.7–4.7)
Alkaline Phosphatase: 64 IU/L (ref 39–117)
BUN/Creatinine Ratio: 12 (ref 12–28)
BUN: 12 mg/dL (ref 8–27)
Bilirubin Total: 0.4 mg/dL (ref 0.0–1.2)
CO2: 22 mmol/L (ref 20–29)
Calcium: 8.3 mg/dL — ABNORMAL LOW (ref 8.7–10.3)
Chloride: 108 mmol/L — ABNORMAL HIGH (ref 96–106)
Creatinine, Ser: 0.99 mg/dL (ref 0.57–1.00)
GFR calc Af Amer: 63 mL/min/{1.73_m2} (ref >59)
GFR calc non Af Amer: 54 mL/min/{1.73_m2} — ABNORMAL LOW (ref >59)
Globulin, Total: 3.2 g/dL (ref 1.5–4.5)
Glucose: 103 mg/dL — ABNORMAL HIGH (ref 65–99)
Potassium: 5 mmol/L (ref 3.5–5.2)
Sodium: 140 mmol/L (ref 134–144)
Total Protein: 7.1 g/dL (ref 6.0–8.5)

## 2019-12-14 LAB — HEMOGLOBIN A1C
Est. average glucose Bld gHb Est-mCnc: 111 mg/dL
Hgb A1c MFr Bld: 5.5 % (ref 4.8–5.6)

## 2019-12-14 LAB — TSH: TSH: 2.74 u[IU]/mL (ref 0.450–4.500)

## 2019-12-15 ENCOUNTER — Other Ambulatory Visit: Payer: Self-pay | Admitting: Medical

## 2019-12-15 DIAGNOSIS — I10 Essential (primary) hypertension: Secondary | ICD-10-CM

## 2020-01-08 ENCOUNTER — Other Ambulatory Visit: Payer: Self-pay

## 2020-01-08 ENCOUNTER — Inpatient Hospital Stay: Payer: Medicare Other | Attending: Hematology

## 2020-01-08 VITALS — BP 144/62 | HR 58 | Temp 98.2°F | Resp 18

## 2020-01-08 DIAGNOSIS — C3490 Malignant neoplasm of unspecified part of unspecified bronchus or lung: Secondary | ICD-10-CM | POA: Insufficient documentation

## 2020-01-08 DIAGNOSIS — C7951 Secondary malignant neoplasm of bone: Secondary | ICD-10-CM | POA: Insufficient documentation

## 2020-01-08 DIAGNOSIS — C155 Malignant neoplasm of lower third of esophagus: Secondary | ICD-10-CM | POA: Diagnosis not present

## 2020-01-08 DIAGNOSIS — Z7189 Other specified counseling: Secondary | ICD-10-CM

## 2020-01-08 DIAGNOSIS — K529 Noninfective gastroenteritis and colitis, unspecified: Secondary | ICD-10-CM

## 2020-01-08 DIAGNOSIS — Z95828 Presence of other vascular implants and grafts: Secondary | ICD-10-CM

## 2020-01-08 DIAGNOSIS — K521 Toxic gastroenteritis and colitis: Secondary | ICD-10-CM

## 2020-01-08 DIAGNOSIS — C3491 Malignant neoplasm of unspecified part of right bronchus or lung: Secondary | ICD-10-CM

## 2020-01-08 DIAGNOSIS — R197 Diarrhea, unspecified: Secondary | ICD-10-CM | POA: Diagnosis not present

## 2020-01-08 MED ORDER — OCTREOTIDE ACETATE 30 MG IM KIT
30.0000 mg | PACK | Freq: Once | INTRAMUSCULAR | Status: AC
  Administered 2020-01-08: 10:00:00 30 mg via INTRAMUSCULAR

## 2020-01-08 MED ORDER — DENOSUMAB 120 MG/1.7ML ~~LOC~~ SOLN
SUBCUTANEOUS | Status: AC
  Filled 2020-01-08: qty 1.7

## 2020-01-08 MED ORDER — OCTREOTIDE ACETATE 30 MG IM KIT
PACK | INTRAMUSCULAR | Status: AC
  Filled 2020-01-08: qty 1

## 2020-01-08 MED ORDER — DENOSUMAB 120 MG/1.7ML ~~LOC~~ SOLN
120.0000 mg | Freq: Once | SUBCUTANEOUS | Status: AC
  Administered 2020-01-08: 10:00:00 120 mg via SUBCUTANEOUS

## 2020-01-08 NOTE — Progress Notes (Signed)
Ok to give xgeva today with ca = 8.3 per Dr Irene Limbo

## 2020-01-08 NOTE — Patient Instructions (Signed)
Denosumab injection What is this medicine? DENOSUMAB (den oh sue mab) slows bone breakdown. Prolia is used to treat osteoporosis in women after menopause and in men, and in people who are taking corticosteroids for 6 months or more. Xgeva is used to treat a high calcium level due to cancer and to prevent bone fractures and other bone problems caused by multiple myeloma or cancer bone metastases. Xgeva is also used to treat giant cell tumor of the bone. This medicine may be used for other purposes; ask your health care Melony Tenpas or pharmacist if you have questions. COMMON BRAND NAME(S): Prolia, XGEVA What should I tell my health care Aly Seidenberg before I take this medicine? They need to know if you have any of these conditions:  dental disease  having surgery or tooth extraction  infection  kidney disease  low levels of calcium or Vitamin D in the blood  malnutrition  on hemodialysis  skin conditions or sensitivity  thyroid or parathyroid disease  an unusual reaction to denosumab, other medicines, foods, dyes, or preservatives  pregnant or trying to get pregnant  breast-feeding How should I use this medicine? This medicine is for injection under the skin. It is given by a health care professional in a hospital or clinic setting. A special MedGuide will be given to you before each treatment. Be sure to read this information carefully each time. For Prolia, talk to your pediatrician regarding the use of this medicine in children. Special care may be needed. For Xgeva, talk to your pediatrician regarding the use of this medicine in children. While this drug may be prescribed for children as young as 13 years for selected conditions, precautions do apply. Overdosage: If you think you have taken too much of this medicine contact a poison control center or emergency room at once. NOTE: This medicine is only for you. Do not share this medicine with others. What if I miss a dose? It is  important not to miss your dose. Call your doctor or health care professional if you are unable to keep an appointment. What may interact with this medicine? Do not take this medicine with any of the following medications:  other medicines containing denosumab This medicine may also interact with the following medications:  medicines that lower your chance of fighting infection  steroid medicines like prednisone or cortisone This list may not describe all possible interactions. Give your health care Munachimso Rigdon a list of all the medicines, herbs, non-prescription drugs, or dietary supplements you use. Also tell them if you smoke, drink alcohol, or use illegal drugs. Some items may interact with your medicine. What should I watch for while using this medicine? Visit your doctor or health care professional for regular checks on your progress. Your doctor or health care professional may order blood tests and other tests to see how you are doing. Call your doctor or health care professional for advice if you get a fever, chills or sore throat, or other symptoms of a cold or flu. Do not treat yourself. This drug may decrease your body's ability to fight infection. Try to avoid being around people who are sick. You should make sure you get enough calcium and vitamin D while you are taking this medicine, unless your doctor tells you not to. Discuss the foods you eat and the vitamins you take with your health care professional. See your dentist regularly. Brush and floss your teeth as directed. Before you have any dental work done, tell your dentist you are   receiving this medicine. Do not become pregnant while taking this medicine or for 5 months after stopping it. Talk with your doctor or health care professional about your birth control options while taking this medicine. Women should inform their doctor if they wish to become pregnant or think they might be pregnant. There is a potential for serious side  effects to an unborn child. Talk to your health care professional or pharmacist for more information. What side effects may I notice from receiving this medicine? Side effects that you should report to your doctor or health care professional as soon as possible:  allergic reactions like skin rash, itching or hives, swelling of the face, lips, or tongue  bone pain  breathing problems  dizziness  jaw pain, especially after dental work  redness, blistering, peeling of the skin  signs and symptoms of infection like fever or chills; cough; sore throat; pain or trouble passing urine  signs of low calcium like fast heartbeat, muscle cramps or muscle pain; pain, tingling, numbness in the hands or feet; seizures  unusual bleeding or bruising  unusually weak or tired Side effects that usually do not require medical attention (report to your doctor or health care professional if they continue or are bothersome):  constipation  diarrhea  headache  joint pain  loss of appetite  muscle pain  runny nose  tiredness  upset stomach This list may not describe all possible side effects. Call your doctor for medical advice about side effects. You may report side effects to FDA at 1-800-FDA-1088. Where should I keep my medicine? This medicine is only given in a clinic, doctor's office, or other health care setting and will not be stored at home. NOTE: This sheet is a summary. It may not cover all possible information. If you have questions about this medicine, talk to your doctor, pharmacist, or health care Dulcey Riederer.  2020 Elsevier/Gold Standard (2018-03-30 16:10:44) Octreotide injection solution What is this medicine? OCTREOTIDE (ok TREE oh tide) is used to reduce blood levels of growth hormone in patients with a condition called acromegaly. This medicine also reduces flushing and watery diarrhea caused by certain types of cancer. This medicine may be used for other purposes; ask your  health care Grabiel Schmutz or pharmacist if you have questions. COMMON BRAND NAME(S): Leatha Gilding, Sandostatin What should I tell my health care Ruey Storer before I take this medicine? They need to know if you have any of these conditions:  diabetes  gallbladder disease  kidney disease  liver disease  thyroid disease  an unusual or allergic reaction to octreotide, other medicines, foods, dyes, or preservatives  pregnant or trying to get pregnant  breast-feeding How should I use this medicine? This medicine is for injection under the skin or into a vein (only in emergency situations). It is usually given by a health care professional in a hospital or clinic setting. If you get this medicine at home, you will be taught how to prepare and give this medicine. Allow the injection solution to come to room temperature before use. Do not warm it artificially. Use exactly as directed. Take your medicine at regular intervals. Do not take your medicine more often than directed. It is important that you put your used needles and syringes in a special sharps container. Do not put them in a trash can. If you do not have a sharps container, call your pharmacist or healthcare Dickie Cloe to get one. Talk to your pediatrician regarding the use of this medicine in children. Special  care may be needed. Overdosage: If you think you have taken too much of this medicine contact a poison control center or emergency room at once. NOTE: This medicine is only for you. Do not share this medicine with others. What if I miss a dose? If you miss a dose, take it as soon as you can. If it is almost time for your next dose, take only that dose. Do not take double or extra doses. What may interact with this medicine?  bromocriptine  certain medicines for blood pressure, heart disease, irregular heartbeat  cyclosporine  diuretics  medicines for diabetes, including insulin  quinidine This list may not describe all possible  interactions. Give your health care Chanan Detwiler a list of all the medicines, herbs, non-prescription drugs, or dietary supplements you use. Also tell them if you smoke, drink alcohol, or use illegal drugs. Some items may interact with your medicine. What should I watch for while using this medicine? Visit your doctor or health care professional for regular checks on your progress. To help reduce irritation at the injection site, use a different site for each injection and make sure the solution is at room temperature before use. This medicine may cause decreases in blood sugar. Signs of low blood sugar include chills, cool, pale skin or cold sweats, drowsiness, extreme hunger, fast heartbeat, headache, nausea, nervousness or anxiety, shakiness, trembling, unsteadiness, tiredness, or weakness. Contact your doctor or health care professional right away if you experience any of these symptoms. This medicine may increase blood sugar. Ask your healthcare Beckham Buxbaum if changes in diet or medicines are needed if you have diabetes. This medicine may cause a decrease in vitamin B12. You should make sure that you get enough vitamin B12 while you are taking this medicine. Discuss the foods you eat and the vitamins you take with your health care professional. What side effects may I notice from receiving this medicine? Side effects that you should report to your doctor or health care professional as soon as possible:  allergic reactions like skin rash, itching or hives, swelling of the face, lips, or tongue  fast, slow, or irregular heartbeat  right upper belly pain  severe stomach pain  signs and symptoms of high blood sugar such as being more thirsty or hungry or having to urinate more than normal. You may also feel very tired or have blurry vision.  signs and symptoms of low blood sugar such as feeling anxious; confusion; dizziness; increased hunger; unusually weak or tired; increased sweating; shakiness;  cold, clammy skin; irritable; headache; blurred vision; fast heartbeat; loss of consciousness  unusually weak or tired Side effects that usually do not require medical attention (report to your doctor or health care professional if they continue or are bothersome):  diarrhea  dizziness  gas  headache  nausea, vomiting  pain, redness, or irritation at site where injected  upset stomach This list may not describe all possible side effects. Call your doctor for medical advice about side effects. You may report side effects to FDA at 1-800-FDA-1088. Where should I keep my medicine? Keep out of the reach of children. Store in a refrigerator between 2 and 8 degrees C (36 and 46 degrees F). Protect from light. Allow to come to room temperature naturally. Do not use artificial heat. If protected from light, the injection may be stored at room temperature between 20 and 30 degrees C (70 and 86 degrees F) for 14 days. After the initial use, throw away any unused portion  of a multiple dose vial after 14 days. Throw away unused portions of the ampules after use. NOTE: This sheet is a summary. It may not cover all possible information. If you have questions about this medicine, talk to your doctor, pharmacist, or health care Rehman Levinson.  2020 Elsevier/Gold Standard (2019-06-20 13:33:09)

## 2020-01-20 NOTE — Telephone Encounter (Signed)
Refill request

## 2020-01-21 ENCOUNTER — Other Ambulatory Visit: Payer: Self-pay | Admitting: Adult Health

## 2020-01-27 ENCOUNTER — Telehealth: Payer: Self-pay | Admitting: *Deleted

## 2020-01-27 NOTE — Telephone Encounter (Signed)
Lab and port flush/lab appointments to be moved from 3/3 to 2/24, immediately prior to CT. Per Dr. Irene Limbo ok to move. Patient verbalized understanding and times of new appt: 8:15am lab, 8:30 am port flush - then arrive 9:15 am at [place]

## 2020-01-29 ENCOUNTER — Encounter (HOSPITAL_COMMUNITY): Payer: Self-pay

## 2020-01-29 ENCOUNTER — Ambulatory Visit (HOSPITAL_COMMUNITY)
Admission: RE | Admit: 2020-01-29 | Discharge: 2020-01-29 | Disposition: A | Discharge status: Home / Self Care | Payer: Medicare Other | Source: Ambulatory Visit | Attending: Hematology | Admitting: Hematology

## 2020-01-29 ENCOUNTER — Inpatient Hospital Stay: Payer: Medicare Other

## 2020-01-29 ENCOUNTER — Other Ambulatory Visit: Payer: Self-pay

## 2020-01-29 DIAGNOSIS — C155 Malignant neoplasm of lower third of esophagus: Secondary | ICD-10-CM

## 2020-01-29 DIAGNOSIS — C7951 Secondary malignant neoplasm of bone: Secondary | ICD-10-CM | POA: Diagnosis not present

## 2020-01-29 DIAGNOSIS — C3490 Malignant neoplasm of unspecified part of unspecified bronchus or lung: Secondary | ICD-10-CM | POA: Diagnosis not present

## 2020-01-29 DIAGNOSIS — R1031 Right lower quadrant pain: Secondary | ICD-10-CM | POA: Diagnosis not present

## 2020-01-29 DIAGNOSIS — D500 Iron deficiency anemia secondary to blood loss (chronic): Secondary | ICD-10-CM

## 2020-01-29 DIAGNOSIS — R197 Diarrhea, unspecified: Secondary | ICD-10-CM | POA: Diagnosis not present

## 2020-01-29 DIAGNOSIS — Z5111 Encounter for antineoplastic chemotherapy: Secondary | ICD-10-CM | POA: Diagnosis not present

## 2020-01-29 LAB — CMP (CANCER CENTER ONLY)
ALT: 11 U/L (ref 0–44)
AST: 20 U/L (ref 15–41)
Albumin: 3.4 g/dL — ABNORMAL LOW (ref 3.5–5.0)
Alkaline Phosphatase: 67 U/L (ref 38–126)
Anion gap: 9 (ref 5–15)
BUN: 16 mg/dL (ref 8–23)
CO2: 24 mmol/L (ref 22–32)
Calcium: 8.4 mg/dL — ABNORMAL LOW (ref 8.9–10.3)
Chloride: 108 mmol/L (ref 98–111)
Creatinine: 0.87 mg/dL (ref 0.44–1.00)
GFR, Est AFR Am: >60 mL/min (ref >60)
GFR, Estimated: >60 mL/min (ref >60)
Glucose, Bld: 102 mg/dL — ABNORMAL HIGH (ref 70–99)
Potassium: 3.9 mmol/L (ref 3.5–5.1)
Sodium: 141 mmol/L (ref 135–145)
Total Bilirubin: 0.4 mg/dL (ref 0.3–1.2)
Total Protein: 7.4 g/dL (ref 6.5–8.1)

## 2020-01-29 MED ORDER — HEPARIN SOD (PORK) LOCK FLUSH 100 UNIT/ML IV SOLN
500.0000 [IU] | Freq: Once | INTRAVENOUS | Status: AC
  Administered 2020-01-29: 10:00:00 500 [IU] via INTRAVENOUS

## 2020-01-29 MED ORDER — SODIUM CHLORIDE (PF) 0.9 % IJ SOLN
INTRAMUSCULAR | Status: AC
  Filled 2020-01-29: qty 50

## 2020-01-29 MED ORDER — HEPARIN SOD (PORK) LOCK FLUSH 100 UNIT/ML IV SOLN
INTRAVENOUS | Status: AC
  Filled 2020-01-29: qty 5

## 2020-01-29 MED ORDER — IOHEXOL 300 MG/ML  SOLN
100.0000 mL | Freq: Once | INTRAMUSCULAR | Status: AC | PRN
  Administered 2020-01-29: 100 mL via INTRAVENOUS

## 2020-01-29 NOTE — Patient Instructions (Signed)
Tunneled Central Venous Catheter Flushing Guide  It is important to flush your tunneled central venous catheter each time you use it, both before and after you use it. Flushing your catheter will help prevent it from clogging. What are the risks? Risks may include:  Infection.  Air getting into the catheter and bloodstream. Supplies needed:  A clean pair of gloves.  A disinfecting wipe. Use an alcohol wipe, chlorhexidine wipe, or iodine wipe as told by your health care provider.  A 10 mL syringe that has been prefilled with saline solution.  An empty 10 mL syringe, if a substance called heparin was injected into your catheter. How to flush your catheter When you flush your catheter, make sure you follow any specific instructions from your health care provider or the manufacturer. These are general guidelines. Flushing your catheter before use If there is heparin in your catheter: 1. Wash your hands with soap and water. 2. Put on gloves. 3. Scrub the injection cap for a minimum of 15 seconds with a disinfecting wipe. 4. Unclamp the catheter. 5. Attach the empty syringe to the injection cap. 6. Pull the syringe plunger back and withdraw 10 mL of blood. 7. Place the syringe into an appropriate waste container. 8. Scrub the injection cap for 15 seconds with a disinfecting wipe. 9. Attach the prefilled syringe to the injection cap. 10. Flush the catheter by pushing the plunger forward until all the liquid from the syringe is in the catheter. 11. Remove the syringe from the injection cap. 12. Clamp the catheter. If there is no heparin in your catheter: 1. Wash your hands with soap and water. 2. Put on gloves. 3. Scrub the injection cap for 15 seconds with a disinfecting wipe. 4. Unclamp the catheter. 5. Attach the prefilled syringe to the injection cap. 6. Flush the catheter by pushing the plunger forward until 5 mL of the liquid from the syringe is in the catheter. 7. Pull back on  the syringe until you see blood in the catheter. 8. If you have been asked to collect any blood, follow your health care provider's instructions. Otherwise, flush the catheter with the rest of the solution from the syringe. 9. Remove the syringe from the injection cap. 10. Clamp the catheter.  Flushing your catheter after use 1. Wash your hands with soap and water. 2. Put on gloves. 3. Scrub the injection cap for 15 seconds with a disinfecting wipe. 4. Unclamp the catheter. 5. Attach the prefilled syringe to the injection cap. 6. Flush the catheter by pushing the plunger forward until all of the liquid from the syringe is in the catheter. 7. Remove the syringe from the injection cap. 8. Clamp the catheter. Problems and solutions  If blood cannot be completely cleared from the injection cap, you may need to have the injection cap replaced.  If the catheter is difficult to flush, use the pulsing method. The pulsing method involves pushing only a few milliliters of solution into the catheter at a time and pausing between pushes.  If you do not see blood in the catheter when you pull back on the syringe, change your body position, such as by raising your arms above your head. Take a deep breath and cough. Then, pull back on the syringe. If you still do not see blood, flush the catheter with a small amount of solution. Then, change positions again and take a breath or cough. Pull back on the syringe again. If you still do not see   blood, finish flushing the catheter and contact your health care provider. Do not use your catheter until your health care provider says it is okay. General tips  Have someone help you flush your catheter, if possible.  Do not force fluid through your catheter.  Do not use a syringe that is larger or smaller than 10 mL. Using a smaller syringe can make the catheter burst.  Do not use your catheter without flushing it first if it has heparin in it. Contact a health  care provider if:  You cannot see any blood in the catheter when you flush it before using it.  Your catheter is difficult to flush. Get help right away if:  You cannot flush the catheter.  The catheter leaks when you flush it or when there is fluid in it.  There are cracks or breaks in the catheter. Summary  It is important to flush your tunneled central venous catheter each time you use it, both before and after you use it.  Scrub the injection cap for 15 seconds with a disinfecting wipe before and after you flush it.  When you flush your catheter, make sure you follow any specific instructions from your health care provider or the manufacturer.  Get help right away if you cannot flush the catheter. This information is not intended to replace advice given to you by your health care provider. Make sure you discuss any questions you have with your health care provider. Document Revised: 08/16/2019 Document Reviewed: 02/06/2019 Elsevier Patient Education  2020 [employer]  

## 2020-02-05 ENCOUNTER — Other Ambulatory Visit: Payer: Self-pay

## 2020-02-05 ENCOUNTER — Other Ambulatory Visit: Payer: Medicare Other

## 2020-02-05 ENCOUNTER — Inpatient Hospital Stay (HOSPITAL_BASED_OUTPATIENT_CLINIC_OR_DEPARTMENT_OTHER): Payer: Medicare Other | Admitting: Hematology

## 2020-02-05 ENCOUNTER — Ambulatory Visit: Payer: Medicare Other

## 2020-02-05 ENCOUNTER — Inpatient Hospital Stay: Payer: Medicare Other | Attending: Hematology

## 2020-02-05 VITALS — BP 127/62 | HR 61 | Temp 98.2°F | Resp 18 | Ht 68.0 in | Wt 137.8 lb

## 2020-02-05 DIAGNOSIS — Z803 Family history of malignant neoplasm of breast: Secondary | ICD-10-CM | POA: Diagnosis not present

## 2020-02-05 DIAGNOSIS — R197 Diarrhea, unspecified: Secondary | ICD-10-CM | POA: Insufficient documentation

## 2020-02-05 DIAGNOSIS — Z95828 Presence of other vascular implants and grafts: Secondary | ICD-10-CM

## 2020-02-05 DIAGNOSIS — K589 Irritable bowel syndrome without diarrhea: Secondary | ICD-10-CM | POA: Insufficient documentation

## 2020-02-05 DIAGNOSIS — Z7901 Long term (current) use of anticoagulants: Secondary | ICD-10-CM | POA: Diagnosis not present

## 2020-02-05 DIAGNOSIS — C3491 Malignant neoplasm of unspecified part of right bronchus or lung: Secondary | ICD-10-CM | POA: Diagnosis not present

## 2020-02-05 DIAGNOSIS — Z800 Family history of malignant neoplasm of digestive organs: Secondary | ICD-10-CM | POA: Diagnosis not present

## 2020-02-05 DIAGNOSIS — I10 Essential (primary) hypertension: Secondary | ICD-10-CM | POA: Diagnosis not present

## 2020-02-05 DIAGNOSIS — K521 Toxic gastroenteritis and colitis: Secondary | ICD-10-CM

## 2020-02-05 DIAGNOSIS — C7951 Secondary malignant neoplasm of bone: Secondary | ICD-10-CM | POA: Diagnosis not present

## 2020-02-05 DIAGNOSIS — Z87891 Personal history of nicotine dependence: Secondary | ICD-10-CM | POA: Insufficient documentation

## 2020-02-05 DIAGNOSIS — K529 Noninfective gastroenteritis and colitis, unspecified: Secondary | ICD-10-CM

## 2020-02-05 DIAGNOSIS — Z86718 Personal history of other venous thrombosis and embolism: Secondary | ICD-10-CM | POA: Diagnosis not present

## 2020-02-05 DIAGNOSIS — Z86711 Personal history of pulmonary embolism: Secondary | ICD-10-CM | POA: Diagnosis not present

## 2020-02-05 DIAGNOSIS — Z79899 Other long term (current) drug therapy: Secondary | ICD-10-CM | POA: Diagnosis not present

## 2020-02-05 DIAGNOSIS — C155 Malignant neoplasm of lower third of esophagus: Secondary | ICD-10-CM

## 2020-02-05 DIAGNOSIS — Z823 Family history of stroke: Secondary | ICD-10-CM | POA: Diagnosis not present

## 2020-02-05 DIAGNOSIS — C3490 Malignant neoplasm of unspecified part of unspecified bronchus or lung: Secondary | ICD-10-CM | POA: Diagnosis not present

## 2020-02-05 DIAGNOSIS — F17210 Nicotine dependence, cigarettes, uncomplicated: Secondary | ICD-10-CM | POA: Insufficient documentation

## 2020-02-05 DIAGNOSIS — Z7189 Other specified counseling: Secondary | ICD-10-CM

## 2020-02-05 MED ORDER — OCTREOTIDE ACETATE 30 MG IM KIT: 30.0000 mg | PACK | Freq: Once | INTRAMUSCULAR | Status: DC

## 2020-02-05 MED ORDER — OCTREOTIDE ACETATE 30 MG IM KIT
PACK | INTRAMUSCULAR | Status: AC
  Filled 2020-02-05: qty 1

## 2020-02-05 NOTE — Progress Notes (Signed)
Patient did not get sandostatin 30 injection today. Called patient and she was already at home. she stated that she was not aware of injection appointment after leaving doctor visit with Dr Irene Limbo and did not want to drive back to receive injection today. Sent schedule message to get her rescheduled for injection

## 2020-02-05 NOTE — Patient Instructions (Signed)
Octreotide injection solution What is this medicine? OCTREOTIDE (ok TREE oh tide) is used to reduce blood levels of growth hormone in patients with a condition called acromegaly. This medicine also reduces flushing and watery diarrhea caused by certain types of cancer. This medicine may be used for other purposes; ask your health care provider or pharmacist if you have questions. COMMON BRAND NAME(S): Bynfezia, Sandostatin What should I tell my health care provider before I take this medicine? They need to know if you have any of these conditions:  diabetes  gallbladder disease  kidney disease  liver disease  thyroid disease  an unusual or allergic reaction to octreotide, other medicines, foods, dyes, or preservatives  pregnant or trying to get pregnant  breast-feeding How should I use this medicine? This medicine is for injection under the skin or into a vein (only in emergency situations). It is usually given by a health care professional in a hospital or clinic setting. If you get this medicine at home, you will be taught how to prepare and give this medicine. Allow the injection solution to come to room temperature before use. Do not warm it artificially. Use exactly as directed. Take your medicine at regular intervals. Do not take your medicine more often than directed. It is important that you put your used needles and syringes in a special sharps container. Do not put them in a trash can. If you do not have a sharps container, call your pharmacist or healthcare provider to get one. Talk to your pediatrician regarding the use of this medicine in children. Special care may be needed. Overdosage: If you think you have taken too much of this medicine contact a poison control center or emergency room at once. NOTE: This medicine is only for you. Do not share this medicine with others. What if I miss a dose? If you miss a dose, take it as soon as you can. If it is almost time for your  next dose, take only that dose. Do not take double or extra doses. What may interact with this medicine?  bromocriptine  certain medicines for blood pressure, heart disease, irregular heartbeat  cyclosporine  diuretics  medicines for diabetes, including insulin  quinidine This list may not describe all possible interactions. Give your health care provider a list of all the medicines, herbs, non-prescription drugs, or dietary supplements you use. Also tell them if you smoke, drink alcohol, or use illegal drugs. Some items may interact with your medicine. What should I watch for while using this medicine? Visit your doctor or health care professional for regular checks on your progress. To help reduce irritation at the injection site, use a different site for each injection and make sure the solution is at room temperature before use. This medicine may cause decreases in blood sugar. Signs of low blood sugar include chills, cool, pale skin or cold sweats, drowsiness, extreme hunger, fast heartbeat, headache, nausea, nervousness or anxiety, shakiness, trembling, unsteadiness, tiredness, or weakness. Contact your doctor or health care professional right away if you experience any of these symptoms. This medicine may increase blood sugar. Ask your healthcare provider if changes in diet or medicines are needed if you have diabetes. This medicine may cause a decrease in vitamin B12. You should make sure that you get enough vitamin B12 while you are taking this medicine. Discuss the foods you eat and the vitamins you take with your health care professional. What side effects may I notice from receiving this medicine? Side   effects that you should report to your doctor or health care professional as soon as possible:  allergic reactions like skin rash, itching or hives, swelling of the face, lips, or tongue  fast, slow, or irregular heartbeat  right upper belly pain  severe stomach pain  signs  and symptoms of high blood sugar such as being more thirsty or hungry or having to urinate more than normal. You may also feel very tired or have blurry vision.  signs and symptoms of low blood sugar such as feeling anxious; confusion; dizziness; increased hunger; unusually weak or tired; increased sweating; shakiness; cold, clammy skin; irritable; headache; blurred vision; fast heartbeat; loss of consciousness  unusually weak or tired Side effects that usually do not require medical attention (report to your doctor or health care professional if they continue or are bothersome):  diarrhea  dizziness  gas  headache  nausea, vomiting  pain, redness, or irritation at site where injected  upset stomach This list may not describe all possible side effects. Call your doctor for medical advice about side effects. You may report side effects to FDA at 1-800-FDA-1088. Where should I keep my medicine? Keep out of the reach of children. Store in a refrigerator between 2 and 8 degrees C (36 and 46 degrees F). Protect from light. Allow to come to room temperature naturally. Do not use artificial heat. If protected from light, the injection may be stored at room temperature between 20 and 30 degrees C (70 and 86 degrees F) for 14 days. After the initial use, throw away any unused portion of a multiple dose vial after 14 days. Throw away unused portions of the ampules after use. NOTE: This sheet is a summary. It may not cover all possible information. If you have questions about this medicine, talk to your doctor, pharmacist, or health care provider.  2020 Elsevier/Gold Standard (2019-06-20 13:33:09)  

## 2020-02-05 NOTE — Progress Notes (Signed)
HEMATOLOGY ONCOLOGY PROGRESS NOTE  Date of service:  02/05/20    Patient Care Team: [name], NP as PCP - General (Family Medicine) [name], MD as Consulting Physician (Hematology and Oncology)  CHIEF COMPLAINTS/PURPOSE OF CONSULTATION:  F/u for metastatic lung cancer  DIAGNOSIS:   #1 Metastatic non-small cell lung cancer with bilateral lung nodules and large metastatic lesion in the left Ilium. 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 Also had c diff colitis - treated   Current Treatment  1) Active surveillance 2) Xgeva 171m [place] q4weeks for bone metastases. 3) Sandostatin q4weeks for diarrhea - immune colitis  Previous Treatment  1 Palliative radiation therapy to the large left ilium metastases 2. IV Nivolumab x 20ycles (discontinued due to likely immune colitis) 3. Xgeva 129mSC q4weeks for bone metastases.   HISTORY OF PRESENTING ILLNESS: (plz see my previous consultation for details of initial presentation)  INTERVAL HISTORY:   Ms ToHaaks presenting today for her scheduled follow-up for metastatic lung cancer, and adenocarcinoma of the esophagus. The patient's last visit with [place] on 11/13/2019. The pt reports that she is doing well overall.  The pt reports that she is still having difficulty eating certain foods and is does not want to puree her food. She does not have specific follow-up with Dr. PyHilarie Fredricksont this time. She also has to drink liquids slowly to get them down. Pt is able to eat enough to keep her weight up. She notes that she has to have a bowel movement pretty soon after nearly every meal. Pt is concerned about receiving the COVID19 vaccine. Pt denies any recent dental concerns. She has continued her smoking cessation.   Of note since the patient's last visit, pt has had CT C/A/P ([id number] on 01/29/2020 with results revealing "No evidence of new  or progressive metastatic disease in the chest, abdomen or pelvis. 2. Stable postradiation change in the paramediastinal right greater than left lower lobes. Stable mild circumferential wall thickening in the lower thoracic esophagus. 3. Stable expansile mixed lytic and sclerotic change throughout the left iliac wing with associated chronic incompletely healed pathologic fracture medially involving the left sacroiliac joint."  Lab results today (12/13/2019) of CBC w/diff is as follows: all values are WNL except for MCHC at 31.4, RDW at 17.0. 01/29/2020 CMP is as follows: all values are WNL except for Glucose at 102, Calcium at 8.4, Albumin at 3.4.  On review of systems, pt reports frequent bowel movements, difficulty swallowing and denies unexpected weight loss, dental issues, mouth sores, abdominal pain, leg swelling and any other symptoms.   MEDICAL HISTORY:  Past Medical History:  Diagnosis Date  . Barrett's esophagus   . Bone neoplasm 06/24/2015  [facility]   metastatic poorly differentiated carcinoma. tumor left groin surgical removal with radiation tx.  . Cataract    BILATERAL  . Cigarette smoker two packs a day or less    Currently still smoking 2 PPD - Not interested in quitting at this time.  . Colitis 2017  . Colon polyps    hyperplastic, tubular adenomas, tubulovillous adenoma  . Cough, persistent    hx. lung cancer ? primary-being evaluated, unsure of primary site.  . Depression 06/24/2015  . Diverticulosis   . Emphysema of lung ([facility]  . Endometriosis    Hysterectomy with BSO at age [age]rs  . Esophageal adenocarcinoma ([facility]  intramucosal  . Gastritis   . GERD (gastroesophageal reflux disease)   . H/O: pneumonia   . Hiatal hernia   . Hyperlipidemia   . Hypertension 06/24/2015   likely improved incidental to 40 lbs weight loss from her neoplasm. No Longer taking med for this as of 08-06-15  . IBS (irritable bowel syndrome)   . Pain    left  hip-persistent"tumor of bone"-radiation tx. 10.  . Vitamin D deficiency disease    SURGICAL HISTORY: Past Surgical History:  Procedure Laterality Date  . ABDOMINAL HYSTERECTOMY    . BALLOON DILATION N/A 10/08/2019   Procedure: BALLOON DILATION;  Surgeon: [name], MD;  Location: [facility];  Service: Gastroenterology;  Laterality: N/A;  . BARTHOLIN GLAND CYST EXCISION  79 yo ago   Does not want if it was an infected cyst or tumor. Was soon as delivery  . BIOPSY  01/02/2019   Procedure: BIOPSY;  Surgeon: [name], MD;  Location: [facility];  Service: Gastroenterology;;  . CATARACT EXTRACTION    . COLONOSCOPY W/ POLYPECTOMY     multiple times - last done 09/2014 per patient.  . ESOPHAGOGASTRODUODENOSCOPY (EGD) WITH PROPOFOL N/A 08/11/2015   Procedure: ESOPHAGOGASTRODUODENOSCOPY (EGD) WITH PROPOFOL;  Surgeon: [name], MD;  Location: [facility];  Service: Gastroenterology;  Laterality: N/A;  . ESOPHAGOGASTRODUODENOSCOPY (EGD) WITH PROPOFOL N/A 01/02/2019   Procedure: ESOPHAGOGASTRODUODENOSCOPY (EGD) WITH PROPOFOL;  Surgeon: [name], MD;  Location: [facility];  Service: Gastroenterology;  Laterality: N/A;  . ESOPHAGOGASTRODUODENOSCOPY (EGD) WITH PROPOFOL N/A 10/08/2019   Procedure: ESOPHAGOGASTRODUODENOSCOPY (EGD) WITH PROPOFOL;  Surgeon: [name], MD;  Location: [facility];  Service: Gastroenterology;  Laterality: N/A;  . FLEXIBLE SIGMOIDOSCOPY N/A 06/24/2017   Procedure: FLEXIBLE SIGMOIDOSCOPY;  Surgeon: Manus Gunning, MD;  Location: [facility];  Service: Gastroenterology;  Laterality: N/A;  . GANGLION CYST EXCISION    . KNEE ARTHROSCOPY  age about 21 yrs  . TONSILLECTOMY    . TOTAL ABDOMINAL HYSTERECTOMY W/ BILATERAL SALPINGOOPHORECTOMY  at age 28 yrs   For endometriosis    SOCIAL HISTORY: Social History   Socioeconomic History  . Marital status: Widowed    Spouse name: Not on file  . Number of children: 2  . Years of education: Not on file   . Highest education level: Not on file  Occupational History  . Not on file  Tobacco Use  . Smoking status: Former Smoker    Packs/day: 1.00    Years: 60.00    Pack years: 60.00    Types: Cigarettes    Quit date: 12/05/2014    Years since quitting: 5.1  . Smokeless tobacco: Never Used  Substance and Sexual Activity  . Alcohol use: No    Alcohol/week: 0.0 standard drinks  . Drug use: No  . Sexual activity: Not Currently  Other Topics Concern  . Not on file  Social History Narrative  . Not on file   Social Determinants of Health   Financial Resource Strain:   . Difficulty of Paying Living Expenses: Not on file  Food Insecurity:   . Worried About Charity fundraiser in the Last Year: Not on file  . Ran Out of Food in the Last Year: Not on file  Transportation Needs:   . Lack of Transportation (Medical): Not on file  . Lack of Transportation (Non-Medical): Not on file  Physical Activity:   . Days of Exercise per Week: Not on file  . Minutes of Exercise per Session:  Not on file  Stress:   . Feeling of Stress : Not on file  Social Connections:   . Frequency of Communication with Friends and Family: Not on file  . Frequency of Social Gatherings with Friends and Family: Not on file  . Attends Religious Services: Not on file  . Active Member of Clubs or Organizations: Not on file  . Attends Archivist Meetings: Not on file  . Marital Status: Not on file  Intimate Partner Violence:   . Fear of Current or Ex-Partner: Not on file  . Emotionally Abused: Not on file  . Physically Abused: Not on file  . Sexually Abused: Not on file    FAMILY HISTORY: Family History  Problem Relation Age of Onset  . Colon cancer Brother   . Colon cancer Brother   . Stroke Mother   . Colon cancer Father   . Emphysema Father        smoked  . Breast cancer Daughter 53       ER/PR+ stage II    ALLERGIES:  is allergic to penicillins; remeron [mirtazapine]; and latex. patient  wonders if she has a penicillin allergy but notes that she is uncertain about this.  MEDICATIONS:  Current Outpatient Medications  Medication Sig Dispense Refill  . amLODipine (NORVASC) 5 MG tablet TAKE 1 TABLET BY MOUTH EVERY DAY 30 tablet 1  . Biotin (BIOTIN 5000) 5 MG CAPS Take 5 mg by mouth daily.    . Calcium Citrate-Vitamin D (CALCIUM + D PO) Take 1 tablet by mouth daily.    . Cyanocobalamin (B-12) 2500 MCG TABS Take 2,500 mcg by mouth daily.    [name] FLUoxetine (PROZAC) 20 MG capsule TAKE 1 CAPSULE BY MOUTH EVERY DAY 90 capsule 0  . lidocaine-prilocaine (EMLA) cream Apply to affected area once (Patient taking differently: Apply 1 application topically as needed (port access). ) 30 g 3  . omeprazole (PRILOSEC) 40 MG capsule TAKE 1 CAPSULE BY MOUTH EVERY DAY (Patient taking differently: Take 40 mg by mouth daily. ) 90 capsule 3  . traZODone (DESYREL) 50 MG tablet TAKE 1 TABLET BY MOUTH EVERYDAY AT BEDTIME 90 tablet 2  . Vitamin D, Ergocalciferol, (DRISDOL) 1.25 MG (50000 UT) CAPS capsule TAKE 1 CAPSULE BY MOUTH EVERY 7 DAYS (Patient taking differently: Take 50,000 Units by mouth every Wednesday. ) 12 capsule 3  . XARELTO 20 MG TABS tablet TAKE 1 TABLET (20 MG TOTAL) BY MOUTH DAILY WITH SUPPER. 90 tablet 0   No current facility-administered medications for this visit.    REVIEW OF SYSTEMS:   A 10+ POINT REVIEW OF SYSTEMS WAS OBTAINED including neurology, dermatology, psychiatry, cardiac, respiratory, lymph, extremities, GI, GU, Musculoskeletal, constitutional, breasts, reproductive, HEENT.  All pertinent positives are noted in the HPI.  All others are negative.  PHYSICAL EXAMINATION: ECOG PERFORMANCE STATUS: 2 - Symptomatic, <50% confined to bed  Vitals:   02/05/20 1323  BP: 127/62  Pulse: 61  Resp: 18  Temp: 98.2 F (36.8 C)  SpO2: 96%   Filed Weights   02/05/20 1323  Weight: 137 lb 12.8 oz (62.5 kg)  .  Wt Readings from Last 3 Encounters:  02/05/20 137 lb 12.8 oz (62.5 kg)   12/04/19 140 lb 11.2 oz (63.8 kg)  11/13/19 136 lb 9.6 oz (62 kg)   GENERAL:alert, in no acute distress and comfortable SKIN: no acute rashes, no significant lesions EYES: conjunctiva are pink and non-injected, sclera anicteric OROPHARYNX: MMM, no exudates, no oropharyngeal erythema or  ulceration NECK: supple, no JVD LYMPH:  no palpable lymphadenopathy in the cervical, axillary or inguinal regions LUNGS: clear to auscultation b/l with normal respiratory effort HEART: regular rate & rhythm ABDOMEN:  normoactive bowel sounds , non tender, not distended. No palpable hepatosplenomegaly.  Extremity: no pedal edema PSYCH: alert & oriented x 3 with fluent speech NEURO: no focal motor/sensory deficits  LABORATORY DATA:  I have reviewed the data as listed  . CBC Latest Ref Rng & Units 12/13/2019 12/11/2019 11/13/2019  WBC 3.4 - 10.8 x10E3/uL 5.6 5.3 4.4  Hemoglobin 11.1 - 15.9 g/dL 11.9 11.5(L) 10.4(L)  Hematocrit 34.0 - 46.6 % 37.9 36.8 32.9(L)  Platelets 150 - 450 x10E3/uL 187 169 161   ANC 1.8k . CMP Latest Ref Rng & Units 01/29/2020 12/13/2019 12/11/2019  Glucose 70 - 99 mg/dL 102(H) 103(H) 107(H)  BUN 8 - 23 mg/dL 16 12 13   Creatinine 0.44 - 1.00 mg/dL 0.87 0.99 0.95  Sodium 135 - 145 mmol/L 141 140 139  Potassium 3.5 - 5.1 mmol/L 3.9 5.0 4.5  Chloride 98 - 111 mmol/L 108 108(H) 112(H)  CO2 22 - 32 mmol/L 24 22 22   Calcium 8.9 - 10.3 mg/dL 8.4(L) 8.3(L) 8.0(L)  Total Protein 6.5 - 8.1 g/dL 7.4 7.1 7.4  Total Bilirubin 0.3 - 1.2 mg/dL 0.4 0.4 0.5  Alkaline Phos 38 - 126 U/L 67 64 58  AST 15 - 41 U/L 20 23 25   ALT 0 - 44 U/L 11 13 14        01/02/19 Esophagus Biopsy:    RADIOGRAPHIC STUDIES:  .CT Chest W Contrast  Result Date: 01/29/2020 CLINICAL DATA:  Lung cancer with bone metastases. Immunotherapy and right hip radiation therapy completed 2017. history of esophageal cancer with chemotherapy and radiation therapy completed April 2020. Patient reports dyspnea, right lower  quadrant pain, diarrhea, mid chest pain and cough. Restaging. EXAM: CT CHEST, ABDOMEN, AND PELVIS WITH CONTRAST TECHNIQUE: Multidetector CT imaging of the chest, abdomen and pelvis was performed following the standard protocol during bolus administration of intravenous contrast. CONTRAST:  143m OMNIPAQUE IOHEXOL 300 MG/ML  SOLN COMPARISON:  09/27/2027 chest CT. 07/03/2019 PET-CT. 11/06/2018 CT chest, abdomen and pelvis. FINDINGS: CT CHEST FINDINGS Cardiovascular: Normal heart size. No significant pericardial effusion/thickening. Left anterior descending coronary atherosclerosis. Atherosclerotic nonaneurysmal thoracic aorta. Stable top-normal caliber main pulmonary artery (3.2 cm diameter). Right internal jugular Port-A-Cath terminates in the lower third of the SVC. No central pulmonary emboli. Mediastinum/Nodes: No discrete thyroid nodules. Mild circumferential wall thickening in the lower thoracic esophagus, not substantially changed. No pathologically enlarged axillary, mediastinal or hilar lymph nodes. Lungs/Pleura: No pneumothorax. No pleural effusion. Mild centrilobular emphysema. Patchy paramediastinal consolidation, ground-glass opacity and reticulation in right lower lobe greater than left lower lobe is unchanged and compatible with postradiation change. No acute consolidative airspace disease, lung masses or significant pulmonary nodules. Musculoskeletal: No aggressive appearing focal osseous lesions. Healed deformities and posterior lower left ribs. Mild thoracic spondylosis. CT ABDOMEN PELVIS FINDINGS Hepatobiliary: Normal liver size. Simple scattered liver cysts, largest 1.1 cm in the lower right liver. Several subcentimeter hypodense lesions scattered in the liver are too small to characterize and are not definitely changed back to 07/19/2018 CT, presumably benign. No definite new liver lesions. Small amount of layering sludge versus tiny gallstones in the otherwise normal gallbladder. No biliary  ductal dilatation. Small periampullary duodenal diverticulum. Pancreas: Normal, with no mass or duct dilation. Spleen: Normal size. No mass. Adrenals/Urinary Tract: Normal adrenals. Normal kidneys with no hydronephrosis and no  renal mass. Normal bladder. Stomach/Bowel: Small hiatal hernia. Otherwise normal nondistended stomach. Normal caliber small bowel with no small bowel wall thickening. Normal appendix. Oral contrast transits to the left colon. Moderate left colonic diverticulosis, with no large bowel wall thickening or acute pericolonic fat stranding. Vascular/Lymphatic: Atherosclerotic nonaneurysmal abdominal aorta. Patent portal, splenic, hepatic and renal veins. No pathologically enlarged lymph nodes in the abdomen or pelvis. Reproductive: Status post hysterectomy, with no abnormal findings at the vaginal cuff. No adnexal mass. Other: No pneumoperitoneum, ascites or focal fluid collection. Focal mixed soft tissue and fat density nodule measuring 1.8 cm in the right buttock (series 2/image 99), favor fat necrosis. Musculoskeletal: Expansile mixed lytic and sclerotic change throughout the left iliac wing with associated chronic incompletely healed pathologic fracture medially involving the left sacroiliac joint, not definitely changed. No new focal osseous lesions. Mild lumbar spondylosis. IMPRESSION: 1. No evidence of new or progressive metastatic disease in the chest, abdomen or pelvis. 2. Stable postradiation change in the paramediastinal right greater than left lower lobes. Stable mild circumferential wall thickening in the lower thoracic esophagus. 3. Stable expansile mixed lytic and sclerotic change throughout the left iliac wing with associated chronic incompletely healed pathologic fracture medially involving the left sacroiliac joint. 4. Aortic Atherosclerosis (ICD10-I70.0) and Emphysema (ICD10-J43.9). Additional chronic findings as detailed. Electronically Signed   By: Ilona Sorrel M.D.   On:  01/29/2020 14:25   CT Abdomen Pelvis W Contrast  Result Date: 01/29/2020 CLINICAL DATA:  Lung cancer with bone metastases. Immunotherapy and right hip radiation therapy completed 2017. history of esophageal cancer with chemotherapy and radiation therapy completed April 2020. Patient reports dyspnea, right lower quadrant pain, diarrhea, mid chest pain and cough. Restaging. EXAM: CT CHEST, ABDOMEN, AND PELVIS WITH CONTRAST TECHNIQUE: Multidetector CT imaging of the chest, abdomen and pelvis was performed following the standard protocol during bolus administration of intravenous contrast. CONTRAST:  163m OMNIPAQUE IOHEXOL 300 MG/ML  SOLN COMPARISON:  09/27/2027 chest CT. 07/03/2019 PET-CT. 11/06/2018 CT chest, abdomen and pelvis. FINDINGS: CT CHEST FINDINGS Cardiovascular: Normal heart size. No significant pericardial effusion/thickening. Left anterior descending coronary atherosclerosis. Atherosclerotic nonaneurysmal thoracic aorta. Stable top-normal caliber main pulmonary artery (3.2 cm diameter). Right internal jugular Port-A-Cath terminates in the lower third of the SVC. No central pulmonary emboli. Mediastinum/Nodes: No discrete thyroid nodules. Mild circumferential wall thickening in the lower thoracic esophagus, not substantially changed. No pathologically enlarged axillary, mediastinal or hilar lymph nodes. Lungs/Pleura: No pneumothorax. No pleural effusion. Mild centrilobular emphysema. Patchy paramediastinal consolidation, ground-glass opacity and reticulation in right lower lobe greater than left lower lobe is unchanged and compatible with postradiation change. No acute consolidative airspace disease, lung masses or significant pulmonary nodules. Musculoskeletal: No aggressive appearing focal osseous lesions. Healed deformities and posterior lower left ribs. Mild thoracic spondylosis. CT ABDOMEN PELVIS FINDINGS Hepatobiliary: Normal liver size. Simple scattered liver cysts, largest 1.1 cm in the lower  right liver. Several subcentimeter hypodense lesions scattered in the liver are too small to characterize and are not definitely changed back to 07/19/2018 CT, presumably benign. No definite new liver lesions. Small amount of layering sludge versus tiny gallstones in the otherwise normal gallbladder. No biliary ductal dilatation. Small periampullary duodenal diverticulum. Pancreas: Normal, with no mass or duct dilation. Spleen: Normal size. No mass. Adrenals/Urinary Tract: Normal adrenals. Normal kidneys with no hydronephrosis and no renal mass. Normal bladder. Stomach/Bowel: Small hiatal hernia. Otherwise normal nondistended stomach. Normal caliber small bowel with no small bowel wall thickening. Normal appendix. Oral contrast transits to  the left colon. Moderate left colonic diverticulosis, with no large bowel wall thickening or acute pericolonic fat stranding. Vascular/Lymphatic: Atherosclerotic nonaneurysmal abdominal aorta. Patent portal, splenic, hepatic and renal veins. No pathologically enlarged lymph nodes in the abdomen or pelvis. Reproductive: Status post hysterectomy, with no abnormal findings at the vaginal cuff. No adnexal mass. Other: No pneumoperitoneum, ascites or focal fluid collection. Focal mixed soft tissue and fat density nodule measuring 1.8 cm in the right buttock (series 2/image 99), favor fat necrosis. Musculoskeletal: Expansile mixed lytic and sclerotic change throughout the left iliac wing with associated chronic incompletely healed pathologic fracture medially involving the left sacroiliac joint, not definitely changed. No new focal osseous lesions. Mild lumbar spondylosis. IMPRESSION: 1. No evidence of new or progressive metastatic disease in the chest, abdomen or pelvis. 2. Stable postradiation change in the paramediastinal right greater than left lower lobes. Stable mild circumferential wall thickening in the lower thoracic esophagus. 3. Stable expansile mixed lytic and sclerotic  change throughout the left iliac wing with associated chronic incompletely healed pathologic fracture medially involving the left sacroiliac joint. 4. Aortic Atherosclerosis (ICD10-I70.0) and Emphysema (ICD10-J43.9). Additional chronic findings as detailed. Electronically Signed   By: Ilona Sorrel M.D.   On: 01/29/2020 14:25    ASSESSMENT & PLAN:   79 y.o. female with  #1 Metastatic poorly differentiated carcinoma with likely lung primary non-small cell lung cancer.   CT of the head with and without contrast showed no evidence of metastatic disease. EGFR blood test mutation analysis negative. CT chest abdomen pelvis 04/19/2016 shows no evidence of disease progression. Patient tolerated Nivolumab very well but was discontinued when she developed grade 2 Immune colitis. Has been off Nivolumab for >6 months  CT chest abdomen pelvis on 06/24/2016 shows no evidence of new disease or progression of metastatic disease. CT chest abdomen pelvis 09/06/2016 shows 1. Mixed interval response to therapy. 2. There is a new left ventral chest wall lesion deep to the pectoralis musculature worrisome for metastatic disease. 3. Posterior lower lobe nodular densities are identified which may reflect areas of pulmonary metastasis. 4. Interval decrease in size of destructive lesion involving the left iliac bone.  CT chest abd pelvis 12/08/2016: Cystic mass involving the left ventral chest wall has resolved in the interval. Likely was a hematoma due to trauma. Interval increase in size of pleural base mass overlying the posterior and inferior left lower lobe. There is also a new left pleural effusion identified.  CT chest 02/01/2017: Residual irregular soft tissue thickening/volume loss and trace left pleural fluid at the base of the left hemithorax, overall improved in appearance from 12/08/2016. No measurable lesion.  CT chest 05/29/2017 shows no residual pleural based mass or significant pleural effusion in the left  hemithorax. No evidence of thoracic metastatic disease. No evidence of progressive metastatic disease within the abdomen or pelvis. Mixed lytic and blastic lesion involving the left iliac bone and associated pathologic fracture are unchanged.   CT CAP 09/14/17 shows no new changes. She does have slight displacement of her fractured left iliac bone. Evidence of stable disease.   CT CAP 01/04/2018- No new or progressive metastatic disease. Stable large left iliac bone metastasis with associated chronic pathologic fracture.   CT chest/abd/pelvis done on 04/26/18 revealed Stable exam.  No new or progressive interval findings.  07/19/18 CT C/A/P revealed Stable exam.  No new or progressive interval findings. Large destructive left iliac lesion is similar to prior. Aortic Atherosclerosis and Emphysema.    11/06/18 CT C/A/P  revealed Similar appearance of large mixed lytic and sclerotic lesion in the left ilium. No new metastatic lesions are otherwise noted elsewhere in the chest, abdomen or pelvis. 2. Interval development of thickening of the distal third of the esophagus. This is nonspecific, and could be related to underlying reflux esophagitis. However, if there is any clinical concern for Barrett's metaplasia or esophageal neoplasia, further evaluation with nonemergent endoscopy could be considered. 3. Aortic atherosclerosis, in addition to left main coronary artery disease. Assessment for potential risk factor modification, dietary therapy or pharmacologic therapy may be warranted, if clinically Indicated. 4. Diffuse bronchial wall thickening with mild to moderate centrilobular and paraseptal emphysema; imaging findings suggestive of underlying COPD. 5. Additional incidental findings, as above.   #2 Newly diagnosed Adenocarcinoma of the esophagus  Barrett's esophagus 4cms in the distal esophagus with low and high-grade dysplasia  01/02/19 Surgical pathology revealed adenocarcinoma of the esophagus    01/25/19 PET/CT revealed Distal esophageal primary, without hypermetabolic metastatic disease. 2. Chronic left iliac metastasis, as before. 3. Hypermetabolism within and superficial to the right gluteal musculature is most likely related to trauma and/or injection sites. 4. Aortic atherosclerosis, coronary artery atherosclerosis and emphysema.  S/p concurrent Carboplatin and Taxol weekly with RT of 45 Gy in 25 fractions and 5.4 Gy boost, completed between 02/04/19 and 03/27/19  #3 diarrhea-  now resolved was previously. 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 She was previously on on Lialda, budesonide,probiotics and lomotil but not currently taking any of these. Plan -Continue Sandostatin every 4 weeks   #4 h/o diverticulitis and c diff colitis - now resolved Plan  -continue on sandostatin today and q4weeks.  -continue on Lialda  #5 DVT and PE  -continue on Xarelto - no issues with bleeding   #6 Dsypnea 03/14/19 ECHO revealed LV EF of 60-65% 03/06/19 CXR revealed clear lung fields, normal heart size 03/07/19 and 02/25/19 EKGs, no overt concern but some decreased QRS amplitude Did refer to pulmonology for further evaluation and lung function testing  Began steroid inhaler to mitigate possible inflammation in airway, could be some radiation related scarring and emphysema  07/03/2019 PET skull base to thigh revealed "1. Interval response to therapy. Significant reduction in FDG uptake associated with distal esophageal mass. SUV max currently 2.61 versus 16.97 previously. 2. 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 Unchanged 3. Aortic Atherosclerosis (ICD10-I70.0) and Emphysema (ICD10-J43.9). Coronary artery calcifications."  PLAN: -Discussed pt labwork: 12/13/2019 CBC w/diff is as follows: all values are WNL except for MCHC at 31.4, RDW at 17.0. 01/29/2020  CMP is as follows: all values are WNL except for Glucose at 102, Calcium at 8.4, Albumin at 3.4. -Discussed 01/29/2020 CT C/A/P ([id number]) ([id number]) which revealed "No evidence of new or progressive metastatic disease in the chest, abdomen or pelvis. Stable postradiation changes, stable wall thickening in lower esophagus".  -The pt shows no lab, clinical, or radiographic evidence of Metastatic Lung Cancer or Esophageal Adenocarcinoma at this time. -No indication to treat at this time - will continue to monitor with labs, clinical visits, and scans -Recommend pt receive a repeat upper endoscopy every 4-6 months  -Recommend pt receive COVID19 vaccine when available -Continue Sandostatin every 4 weeks  -Continue Xgeva every 12 weeks, no dental concerns at this time -Continue 2000 units Vitamin D every day, with dose adjustment to maintain 25OH Vit D levels of 40-60 -  Continue Trazodone for sleep difficulty -Continue PO Magnesium replacement  -Continue 74m Marinol BID -Recommend pt f/u with Dr. PHilarie Fredricksonfor EGD -Will see back in 3 months with labs   FOLLOW UP: -continue Sandostatin q4weeks plz schedule next 4 doses -change Xgeva to every 12 weeks with labs - plz schedule next 3 doses. -portflush with each lab -MD visit in 3 months with portflush and labs -patient will call Dr PHilarie Fredricksonfor f/u EGD timing  The total time spent in the appt was 30 minutes and more than 50% was on counseling and direct patient cares.  All of the patient's questions were answered with apparent satisfaction. The patient knows to call the clinic with any problems, questions or concerns.  GSullivan LoneMD MBig RockHematology/Oncology Physician [facility]ex[facility] (Office): [phone](Work cell): [phone](Fax): [phone] I, JYevette Edwards am acting as a scribe for Dr. GSullivan Lone   .I have reviewed the above documentation for accuracy and completeness, and I agree with the  above. .Brunetta GeneraMD

## 2020-02-07 ENCOUNTER — Other Ambulatory Visit: Payer: Self-pay

## 2020-02-07 ENCOUNTER — Inpatient Hospital Stay: Payer: Medicare Other

## 2020-02-07 VITALS — BP 128/72 | HR 62 | Temp 98.2°F | Resp 18

## 2020-02-07 DIAGNOSIS — Z7901 Long term (current) use of anticoagulants: Secondary | ICD-10-CM | POA: Diagnosis not present

## 2020-02-07 DIAGNOSIS — K589 Irritable bowel syndrome without diarrhea: Secondary | ICD-10-CM | POA: Diagnosis not present

## 2020-02-07 DIAGNOSIS — Z95828 Presence of other vascular implants and grafts: Secondary | ICD-10-CM

## 2020-02-07 DIAGNOSIS — K521 Toxic gastroenteritis and colitis: Secondary | ICD-10-CM

## 2020-02-07 DIAGNOSIS — Z79899 Other long term (current) drug therapy: Secondary | ICD-10-CM | POA: Diagnosis not present

## 2020-02-07 DIAGNOSIS — I10 Essential (primary) hypertension: Secondary | ICD-10-CM | POA: Diagnosis not present

## 2020-02-07 DIAGNOSIS — C3490 Malignant neoplasm of unspecified part of unspecified bronchus or lung: Secondary | ICD-10-CM | POA: Diagnosis not present

## 2020-02-07 DIAGNOSIS — Z800 Family history of malignant neoplasm of digestive organs: Secondary | ICD-10-CM | POA: Diagnosis not present

## 2020-02-07 DIAGNOSIS — R197 Diarrhea, unspecified: Secondary | ICD-10-CM

## 2020-02-07 DIAGNOSIS — Z86711 Personal history of pulmonary embolism: Secondary | ICD-10-CM | POA: Diagnosis not present

## 2020-02-07 DIAGNOSIS — C155 Malignant neoplasm of lower third of esophagus: Secondary | ICD-10-CM

## 2020-02-07 DIAGNOSIS — Z7189 Other specified counseling: Secondary | ICD-10-CM

## 2020-02-07 DIAGNOSIS — Z87891 Personal history of nicotine dependence: Secondary | ICD-10-CM | POA: Diagnosis not present

## 2020-02-07 DIAGNOSIS — C7951 Secondary malignant neoplasm of bone: Secondary | ICD-10-CM | POA: Diagnosis not present

## 2020-02-07 DIAGNOSIS — Z823 Family history of stroke: Secondary | ICD-10-CM | POA: Diagnosis not present

## 2020-02-07 DIAGNOSIS — K529 Noninfective gastroenteritis and colitis, unspecified: Secondary | ICD-10-CM

## 2020-02-07 DIAGNOSIS — Z86718 Personal history of other venous thrombosis and embolism: Secondary | ICD-10-CM | POA: Diagnosis not present

## 2020-02-07 DIAGNOSIS — Z803 Family history of malignant neoplasm of breast: Secondary | ICD-10-CM | POA: Diagnosis not present

## 2020-02-07 MED ORDER — OCTREOTIDE ACETATE 30 MG IM KIT
30.0000 mg | PACK | Freq: Once | INTRAMUSCULAR | Status: AC
  Administered 2020-02-07: 30 mg via INTRAMUSCULAR

## 2020-02-07 MED ORDER — OCTREOTIDE ACETATE 30 MG IM KIT
PACK | INTRAMUSCULAR | Status: AC
  Filled 2020-02-07: qty 1

## 2020-02-07 NOTE — Patient Instructions (Signed)
Octreotide injection solution What is this medicine? OCTREOTIDE (ok TREE oh tide) is used to reduce blood levels of growth hormone in patients with a condition called acromegaly. This medicine also reduces flushing and watery diarrhea caused by certain types of cancer. This medicine may be used for other purposes; ask your health care provider or pharmacist if you have questions. COMMON BRAND NAME(S): Bynfezia, Sandostatin What should I tell my health care provider before I take this medicine? They need to know if you have any of these conditions:  diabetes  gallbladder disease  kidney disease  liver disease  thyroid disease  an unusual or allergic reaction to octreotide, other medicines, foods, dyes, or preservatives  pregnant or trying to get pregnant  breast-feeding How should I use this medicine? This medicine is for injection under the skin or into a vein (only in emergency situations). It is usually given by a health care professional in a hospital or clinic setting. If you get this medicine at home, you will be taught how to prepare and give this medicine. Allow the injection solution to come to room temperature before use. Do not warm it artificially. Use exactly as directed. Take your medicine at regular intervals. Do not take your medicine more often than directed. It is important that you put your used needles and syringes in a special sharps container. Do not put them in a trash can. If you do not have a sharps container, call your pharmacist or healthcare provider to get one. Talk to your pediatrician regarding the use of this medicine in children. Special care may be needed. Overdosage: If you think you have taken too much of this medicine contact a poison control center or emergency room at once. NOTE: This medicine is only for you. Do not share this medicine with others. What if I miss a dose? If you miss a dose, take it as soon as you can. If it is almost time for your  next dose, take only that dose. Do not take double or extra doses. What may interact with this medicine?  bromocriptine  certain medicines for blood pressure, heart disease, irregular heartbeat  cyclosporine  diuretics  medicines for diabetes, including insulin  quinidine This list may not describe all possible interactions. Give your health care provider a list of all the medicines, herbs, non-prescription drugs, or dietary supplements you use. Also tell them if you smoke, drink alcohol, or use illegal drugs. Some items may interact with your medicine. What should I watch for while using this medicine? Visit your doctor or health care professional for regular checks on your progress. To help reduce irritation at the injection site, use a different site for each injection and make sure the solution is at room temperature before use. This medicine may cause decreases in blood sugar. Signs of low blood sugar include chills, cool, pale skin or cold sweats, drowsiness, extreme hunger, fast heartbeat, headache, nausea, nervousness or anxiety, shakiness, trembling, unsteadiness, tiredness, or weakness. Contact your doctor or health care professional right away if you experience any of these symptoms. This medicine may increase blood sugar. Ask your healthcare provider if changes in diet or medicines are needed if you have diabetes. This medicine may cause a decrease in vitamin B12. You should make sure that you get enough vitamin B12 while you are taking this medicine. Discuss the foods you eat and the vitamins you take with your health care professional. What side effects may I notice from receiving this medicine? Side   effects that you should report to your doctor or health care professional as soon as possible:  allergic reactions like skin rash, itching or hives, swelling of the face, lips, or tongue  fast, slow, or irregular heartbeat  right upper belly pain  severe stomach pain  signs  and symptoms of high blood sugar such as being more thirsty or hungry or having to urinate more than normal. You may also feel very tired or have blurry vision.  signs and symptoms of low blood sugar such as feeling anxious; confusion; dizziness; increased hunger; unusually weak or tired; increased sweating; shakiness; cold, clammy skin; irritable; headache; blurred vision; fast heartbeat; loss of consciousness  unusually weak or tired Side effects that usually do not require medical attention (report to your doctor or health care professional if they continue or are bothersome):  diarrhea  dizziness  gas  headache  nausea, vomiting  pain, redness, or irritation at site where injected  upset stomach This list may not describe all possible side effects. Call your doctor for medical advice about side effects. You may report side effects to FDA at 1-800-FDA-1088. Where should I keep my medicine? Keep out of the reach of children. Store in a refrigerator between 2 and 8 degrees C (36 and 46 degrees F). Protect from light. Allow to come to room temperature naturally. Do not use artificial heat. If protected from light, the injection may be stored at room temperature between 20 and 30 degrees C (70 and 86 degrees F) for 14 days. After the initial use, throw away any unused portion of a multiple dose vial after 14 days. Throw away unused portions of the ampules after use. NOTE: This sheet is a summary. It may not cover all possible information. If you have questions about this medicine, talk to your doctor, pharmacist, or health care provider.  2020 Elsevier/Gold Standard (2019-06-20 13:33:09)  

## 2020-02-13 ENCOUNTER — Telehealth: Payer: Self-pay | Admitting: Hematology

## 2020-02-13 NOTE — Telephone Encounter (Signed)
Scheduled per 03/03 los, patient has been called and notified. Calender will be mailed.

## 2020-02-20 ENCOUNTER — Other Ambulatory Visit: Payer: Self-pay | Admitting: Medical

## 2020-02-20 DIAGNOSIS — I10 Essential (primary) hypertension: Secondary | ICD-10-CM

## 2020-02-24 NOTE — Telephone Encounter (Signed)
Refill request

## 2020-03-06 ENCOUNTER — Inpatient Hospital Stay: Payer: Medicare Other | Attending: Hematology

## 2020-03-06 ENCOUNTER — Other Ambulatory Visit: Payer: Self-pay

## 2020-03-06 VITALS — BP 128/72 | HR 68 | Temp 98.2°F | Resp 18

## 2020-03-06 DIAGNOSIS — C155 Malignant neoplasm of lower third of esophagus: Secondary | ICD-10-CM

## 2020-03-06 DIAGNOSIS — K529 Noninfective gastroenteritis and colitis, unspecified: Secondary | ICD-10-CM

## 2020-03-06 DIAGNOSIS — K521 Toxic gastroenteritis and colitis: Secondary | ICD-10-CM | POA: Insufficient documentation

## 2020-03-06 DIAGNOSIS — Z95828 Presence of other vascular implants and grafts: Secondary | ICD-10-CM

## 2020-03-06 DIAGNOSIS — C7951 Secondary malignant neoplasm of bone: Secondary | ICD-10-CM | POA: Diagnosis not present

## 2020-03-06 DIAGNOSIS — T451X5D Adverse effect of antineoplastic and immunosuppressive drugs, subsequent encounter: Secondary | ICD-10-CM | POA: Insufficient documentation

## 2020-03-06 DIAGNOSIS — C3490 Malignant neoplasm of unspecified part of unspecified bronchus or lung: Secondary | ICD-10-CM | POA: Insufficient documentation

## 2020-03-06 DIAGNOSIS — R197 Diarrhea, unspecified: Secondary | ICD-10-CM

## 2020-03-06 DIAGNOSIS — Z7189 Other specified counseling: Secondary | ICD-10-CM

## 2020-03-06 MED ORDER — OCTREOTIDE ACETATE 30 MG IM KIT
30.0000 mg | PACK | Freq: Once | INTRAMUSCULAR | Status: AC
  Administered 2020-03-06: 30 mg via INTRAMUSCULAR

## 2020-03-06 MED ORDER — OCTREOTIDE ACETATE 30 MG IM KIT
PACK | INTRAMUSCULAR | Status: AC
  Filled 2020-03-06: qty 1

## 2020-03-06 NOTE — Addendum Note (Signed)
Addended by: Tora Kindred on: 03/06/2020 03:31 PM   Modules accepted: Orders

## 2020-03-06 NOTE — Patient Instructions (Signed)
Octreotide injection solution What is this medicine? OCTREOTIDE (ok TREE oh tide) is used to reduce blood levels of growth hormone in patients with a condition called acromegaly. This medicine also reduces flushing and watery diarrhea caused by certain types of cancer. This medicine may be used for other purposes; ask your health care provider or pharmacist if you have questions. COMMON BRAND NAME(S): Bynfezia, Sandostatin What should I tell my health care provider before I take this medicine? They need to know if you have any of these conditions:  diabetes  gallbladder disease  kidney disease  liver disease  thyroid disease  an unusual or allergic reaction to octreotide, other medicines, foods, dyes, or preservatives  pregnant or trying to get pregnant  breast-feeding How should I use this medicine? This medicine is for injection under the skin or into a vein (only in emergency situations). It is usually given by a health care professional in a hospital or clinic setting. If you get this medicine at home, you will be taught how to prepare and give this medicine. Allow the injection solution to come to room temperature before use. Do not warm it artificially. Use exactly as directed. Take your medicine at regular intervals. Do not take your medicine more often than directed. It is important that you put your used needles and syringes in a special sharps container. Do not put them in a trash can. If you do not have a sharps container, call your pharmacist or healthcare provider to get one. Talk to your pediatrician regarding the use of this medicine in children. Special care may be needed. Overdosage: If you think you have taken too much of this medicine contact a poison control center or emergency room at once. NOTE: This medicine is only for you. Do not share this medicine with others. What if I miss a dose? If you miss a dose, take it as soon as you can. If it is almost time for your  next dose, take only that dose. Do not take double or extra doses. What may interact with this medicine?  bromocriptine  certain medicines for blood pressure, heart disease, irregular heartbeat  cyclosporine  diuretics  medicines for diabetes, including insulin  quinidine This list may not describe all possible interactions. Give your health care provider a list of all the medicines, herbs, non-prescription drugs, or dietary supplements you use. Also tell them if you smoke, drink alcohol, or use illegal drugs. Some items may interact with your medicine. What should I watch for while using this medicine? Visit your doctor or health care professional for regular checks on your progress. To help reduce irritation at the injection site, use a different site for each injection and make sure the solution is at room temperature before use. This medicine may cause decreases in blood sugar. Signs of low blood sugar include chills, cool, pale skin or cold sweats, drowsiness, extreme hunger, fast heartbeat, headache, nausea, nervousness or anxiety, shakiness, trembling, unsteadiness, tiredness, or weakness. Contact your doctor or health care professional right away if you experience any of these symptoms. This medicine may increase blood sugar. Ask your healthcare provider if changes in diet or medicines are needed if you have diabetes. This medicine may cause a decrease in vitamin B12. You should make sure that you get enough vitamin B12 while you are taking this medicine. Discuss the foods you eat and the vitamins you take with your health care professional. What side effects may I notice from receiving this medicine? Side   effects that you should report to your doctor or health care professional as soon as possible:  allergic reactions like skin rash, itching or hives, swelling of the face, lips, or tongue  fast, slow, or irregular heartbeat  right upper belly pain  severe stomach pain  signs  and symptoms of high blood sugar such as being more thirsty or hungry or having to urinate more than normal. You may also feel very tired or have blurry vision.  signs and symptoms of low blood sugar such as feeling anxious; confusion; dizziness; increased hunger; unusually weak or tired; increased sweating; shakiness; cold, clammy skin; irritable; headache; blurred vision; fast heartbeat; loss of consciousness  unusually weak or tired Side effects that usually do not require medical attention (report to your doctor or health care professional if they continue or are bothersome):  diarrhea  dizziness  gas  headache  nausea, vomiting  pain, redness, or irritation at site where injected  upset stomach This list may not describe all possible side effects. Call your doctor for medical advice about side effects. You may report side effects to FDA at 1-800-FDA-1088. Where should I keep my medicine? Keep out of the reach of children. Store in a refrigerator between 2 and 8 degrees C (36 and 46 degrees F). Protect from light. Allow to come to room temperature naturally. Do not use artificial heat. If protected from light, the injection may be stored at room temperature between 20 and 30 degrees C (70 and 86 degrees F) for 14 days. After the initial use, throw away any unused portion of a multiple dose vial after 14 days. Throw away unused portions of the ampules after use. NOTE: This sheet is a summary. It may not cover all possible information. If you have questions about this medicine, talk to your doctor, pharmacist, or health care provider.  2020 Elsevier/Gold Standard (2019-06-20 13:33:09)  

## 2020-03-15 ENCOUNTER — Other Ambulatory Visit: Payer: Self-pay | Admitting: Hematology

## 2020-03-20 ENCOUNTER — Other Ambulatory Visit: Payer: Self-pay | Admitting: Hematology

## 2020-03-20 DIAGNOSIS — I10 Essential (primary) hypertension: Secondary | ICD-10-CM

## 2020-03-20 NOTE — Telephone Encounter (Signed)
Please review for refill.  

## 2020-04-03 ENCOUNTER — Inpatient Hospital Stay: Payer: Medicare Other

## 2020-04-03 ENCOUNTER — Other Ambulatory Visit: Payer: Self-pay

## 2020-04-03 VITALS — BP 122/70 | HR 68 | Temp 98.2°F | Resp 18

## 2020-04-03 DIAGNOSIS — C7951 Secondary malignant neoplasm of bone: Secondary | ICD-10-CM | POA: Diagnosis not present

## 2020-04-03 DIAGNOSIS — C3490 Malignant neoplasm of unspecified part of unspecified bronchus or lung: Secondary | ICD-10-CM

## 2020-04-03 DIAGNOSIS — C3491 Malignant neoplasm of unspecified part of right bronchus or lung: Secondary | ICD-10-CM

## 2020-04-03 DIAGNOSIS — Z95828 Presence of other vascular implants and grafts: Secondary | ICD-10-CM

## 2020-04-03 DIAGNOSIS — T451X5D Adverse effect of antineoplastic and immunosuppressive drugs, subsequent encounter: Secondary | ICD-10-CM | POA: Diagnosis not present

## 2020-04-03 DIAGNOSIS — K521 Toxic gastroenteritis and colitis: Secondary | ICD-10-CM | POA: Diagnosis not present

## 2020-04-03 DIAGNOSIS — C155 Malignant neoplasm of lower third of esophagus: Secondary | ICD-10-CM

## 2020-04-03 DIAGNOSIS — K529 Noninfective gastroenteritis and colitis, unspecified: Secondary | ICD-10-CM

## 2020-04-03 DIAGNOSIS — R197 Diarrhea, unspecified: Secondary | ICD-10-CM

## 2020-04-03 DIAGNOSIS — Z7189 Other specified counseling: Secondary | ICD-10-CM

## 2020-04-03 DIAGNOSIS — D500 Iron deficiency anemia secondary to blood loss (chronic): Secondary | ICD-10-CM

## 2020-04-03 LAB — CMP (CANCER CENTER ONLY)
ALT: 7 U/L (ref 0–44)
AST: 17 U/L (ref 15–41)
Albumin: 3.6 g/dL (ref 3.5–5.0)
Alkaline Phosphatase: 67 U/L (ref 38–126)
Anion gap: 8 (ref 5–15)
BUN: 14 mg/dL (ref 8–23)
CO2: 27 mmol/L (ref 22–32)
Calcium: 9.5 mg/dL (ref 8.9–10.3)
Chloride: 107 mmol/L (ref 98–111)
Creatinine: 1.18 mg/dL — ABNORMAL HIGH (ref 0.44–1.00)
GFR, Est AFR Am: 50 mL/min — ABNORMAL LOW (ref >60)
GFR, Estimated: 44 mL/min — ABNORMAL LOW (ref >60)
Glucose, Bld: 117 mg/dL — ABNORMAL HIGH (ref 70–99)
Potassium: 4.2 mmol/L (ref 3.5–5.1)
Sodium: 142 mmol/L (ref 135–145)
Total Bilirubin: 0.5 mg/dL (ref 0.3–1.2)
Total Protein: 7.9 g/dL (ref 6.5–8.1)

## 2020-04-03 MED ORDER — DENOSUMAB 120 MG/1.7ML ~~LOC~~ SOLN
SUBCUTANEOUS | Status: AC
  Filled 2020-04-03: qty 1.7

## 2020-04-03 MED ORDER — OCTREOTIDE ACETATE 30 MG IM KIT
30.0000 mg | PACK | Freq: Once | INTRAMUSCULAR | Status: AC
  Administered 2020-04-03: 30 mg via INTRAMUSCULAR

## 2020-04-03 MED ORDER — DENOSUMAB 120 MG/1.7ML ~~LOC~~ SOLN
120.0000 mg | Freq: Once | SUBCUTANEOUS | Status: AC
  Administered 2020-04-03: 120 mg via SUBCUTANEOUS

## 2020-04-03 NOTE — Patient Instructions (Signed)
Denosumab injection What is this medicine? DENOSUMAB (den oh sue mab) slows bone breakdown. Prolia is used to treat osteoporosis in women after menopause and in men, and in people who are taking corticosteroids for 6 months or more. Xgeva is used to treat a high calcium level due to cancer and to prevent bone fractures and other bone problems caused by multiple myeloma or cancer bone metastases. Xgeva is also used to treat giant cell tumor of the bone. This medicine may be used for other purposes; ask your health care provider or pharmacist if you have questions. COMMON BRAND NAME(S): Prolia, XGEVA What should I tell my health care provider before I take this medicine? They need to know if you have any of these conditions:  dental disease  having surgery or tooth extraction  infection  kidney disease  low levels of calcium or Vitamin D in the blood  malnutrition  on hemodialysis  skin conditions or sensitivity  thyroid or parathyroid disease  an unusual reaction to denosumab, other medicines, foods, dyes, or preservatives  pregnant or trying to get pregnant  breast-feeding How should I use this medicine? This medicine is for injection under the skin. It is given by a health care professional in a hospital or clinic setting. A special MedGuide will be given to you before each treatment. Be sure to read this information carefully each time. For Prolia, talk to your pediatrician regarding the use of this medicine in children. Special care may be needed. For Xgeva, talk to your pediatrician regarding the use of this medicine in children. While this drug may be prescribed for children as young as 13 years for selected conditions, precautions do apply. Overdosage: If you think you have taken too much of this medicine contact a poison control center or emergency room at once. NOTE: This medicine is only for you. Do not share this medicine with others. What if I miss a dose? It is  important not to miss your dose. Call your doctor or health care professional if you are unable to keep an appointment. What may interact with this medicine? Do not take this medicine with any of the following medications:  other medicines containing denosumab This medicine may also interact with the following medications:  medicines that lower your chance of fighting infection  steroid medicines like prednisone or cortisone This list may not describe all possible interactions. Give your health care provider a list of all the medicines, herbs, non-prescription drugs, or dietary supplements you use. Also tell them if you smoke, drink alcohol, or use illegal drugs. Some items may interact with your medicine. What should I watch for while using this medicine? Visit your doctor or health care professional for regular checks on your progress. Your doctor or health care professional may order blood tests and other tests to see how you are doing. Call your doctor or health care professional for advice if you get a fever, chills or sore throat, or other symptoms of a cold or flu. Do not treat yourself. This drug may decrease your body's ability to fight infection. Try to avoid being around people who are sick. You should make sure you get enough calcium and vitamin D while you are taking this medicine, unless your doctor tells you not to. Discuss the foods you eat and the vitamins you take with your health care professional. See your dentist regularly. Brush and floss your teeth as directed. Before you have any dental work done, tell your dentist you are   receiving this medicine. Do not become pregnant while taking this medicine or for 5 months after stopping it. Talk with your doctor or health care professional about your birth control options while taking this medicine. Women should inform their doctor if they wish to become pregnant or think they might be pregnant. There is a potential for serious side  effects to an unborn child. Talk to your health care professional or pharmacist for more information. What side effects may I notice from receiving this medicine? Side effects that you should report to your doctor or health care professional as soon as possible:  allergic reactions like skin rash, itching or hives, swelling of the face, lips, or tongue  bone pain  breathing problems  dizziness  jaw pain, especially after dental work  redness, blistering, peeling of the skin  signs and symptoms of infection like fever or chills; cough; sore throat; pain or trouble passing urine  signs of low calcium like fast heartbeat, muscle cramps or muscle pain; pain, tingling, numbness in the hands or feet; seizures  unusual bleeding or bruising  unusually weak or tired Side effects that usually do not require medical attention (report to your doctor or health care professional if they continue or are bothersome):  constipation  diarrhea  headache  joint pain  loss of appetite  muscle pain  runny nose  tiredness  upset stomach This list may not describe all possible side effects. Call your doctor for medical advice about side effects. You may report side effects to FDA at 1-800-FDA-1088. Where should I keep my medicine? This medicine is only given in a clinic, doctor's office, or other health care setting and will not be stored at home. NOTE: This sheet is a summary. It may not cover all possible information. If you have questions about this medicine, talk to your doctor, pharmacist, or health care provider.  2020 Elsevier/Gold Standard (2018-03-30 16:10:44)  Octreotide injection solution What is this medicine? OCTREOTIDE (ok TREE oh tide) is used to reduce blood levels of growth hormone in patients with a condition called acromegaly. This medicine also reduces flushing and watery diarrhea caused by certain types of cancer. This medicine may be used for other purposes; ask your  health care provider or pharmacist if you have questions. COMMON BRAND NAME(S): Leatha Gilding, Sandostatin What should I tell my health care provider before I take this medicine? They need to know if you have any of these conditions:  diabetes  gallbladder disease  kidney disease  liver disease  thyroid disease  an unusual or allergic reaction to octreotide, other medicines, foods, dyes, or preservatives  pregnant or trying to get pregnant  breast-feeding How should I use this medicine? This medicine is for injection under the skin or into a vein (only in emergency situations). It is usually given by a health care professional in a hospital or clinic setting. If you get this medicine at home, you will be taught how to prepare and give this medicine. Allow the injection solution to come to room temperature before use. Do not warm it artificially. Use exactly as directed. Take your medicine at regular intervals. Do not take your medicine more often than directed. It is important that you put your used needles and syringes in a special sharps container. Do not put them in a trash can. If you do not have a sharps container, call your pharmacist or healthcare provider to get one. Talk to your pediatrician regarding the use of this medicine in children.  Special care may be needed. Overdosage: If you think you have taken too much of this medicine contact a poison control center or emergency room at once. NOTE: This medicine is only for you. Do not share this medicine with others. What if I miss a dose? If you miss a dose, take it as soon as you can. If it is almost time for your next dose, take only that dose. Do not take double or extra doses. What may interact with this medicine?  bromocriptine  certain medicines for blood pressure, heart disease, irregular heartbeat  cyclosporine  diuretics  medicines for diabetes, including insulin  quinidine This list may not describe all possible  interactions. Give your health care provider a list of all the medicines, herbs, non-prescription drugs, or dietary supplements you use. Also tell them if you smoke, drink alcohol, or use illegal drugs. Some items may interact with your medicine. What should I watch for while using this medicine? Visit your doctor or health care professional for regular checks on your progress. To help reduce irritation at the injection site, use a different site for each injection and make sure the solution is at room temperature before use. This medicine may cause decreases in blood sugar. Signs of low blood sugar include chills, cool, pale skin or cold sweats, drowsiness, extreme hunger, fast heartbeat, headache, nausea, nervousness or anxiety, shakiness, trembling, unsteadiness, tiredness, or weakness. Contact your doctor or health care professional right away if you experience any of these symptoms. This medicine may increase blood sugar. Ask your healthcare provider if changes in diet or medicines are needed if you have diabetes. This medicine may cause a decrease in vitamin B12. You should make sure that you get enough vitamin B12 while you are taking this medicine. Discuss the foods you eat and the vitamins you take with your health care professional. What side effects may I notice from receiving this medicine? Side effects that you should report to your doctor or health care professional as soon as possible:  allergic reactions like skin rash, itching or hives, swelling of the face, lips, or tongue  fast, slow, or irregular heartbeat  right upper belly pain  severe stomach pain  signs and symptoms of high blood sugar such as being more thirsty or hungry or having to urinate more than normal. You may also feel very tired or have blurry vision.  signs and symptoms of low blood sugar such as feeling anxious; confusion; dizziness; increased hunger; unusually weak or tired; increased sweating; shakiness;  cold, clammy skin; irritable; headache; blurred vision; fast heartbeat; loss of consciousness  unusually weak or tired Side effects that usually do not require medical attention (report to your doctor or health care professional if they continue or are bothersome):  diarrhea  dizziness  gas  headache  nausea, vomiting  pain, redness, or irritation at site where injected  upset stomach This list may not describe all possible side effects. Call your doctor for medical advice about side effects. You may report side effects to FDA at 1-800-FDA-1088. Where should I keep my medicine? Keep out of the reach of children. Store in a refrigerator between 2 and 8 degrees C (36 and 46 degrees F). Protect from light. Allow to come to room temperature naturally. Do not use artificial heat. If protected from light, the injection may be stored at room temperature between 20 and 30 degrees C (70 and 86 degrees F) for 14 days. After the initial use, throw away any unused  portion of a multiple dose vial after 14 days. Throw away unused portions of the ampules after use. NOTE: This sheet is a summary. It may not cover all possible information. If you have questions about this medicine, talk to your doctor, pharmacist, or health care provider.  2020 Elsevier/Gold Standard (2019-06-20 13:33:09)

## 2020-04-11 IMAGING — CR CHEST - 2 VIEW
2 series · 2 of 2 positions shown · non-contrast
Comparison: 06/28/2018

CLINICAL DATA: SOB x 2 days - hx pulmonary embolisms - currently
being treated for esophageal cancer - current smoker

EXAM:
CHEST - 2 VIEW

[w chest lat]
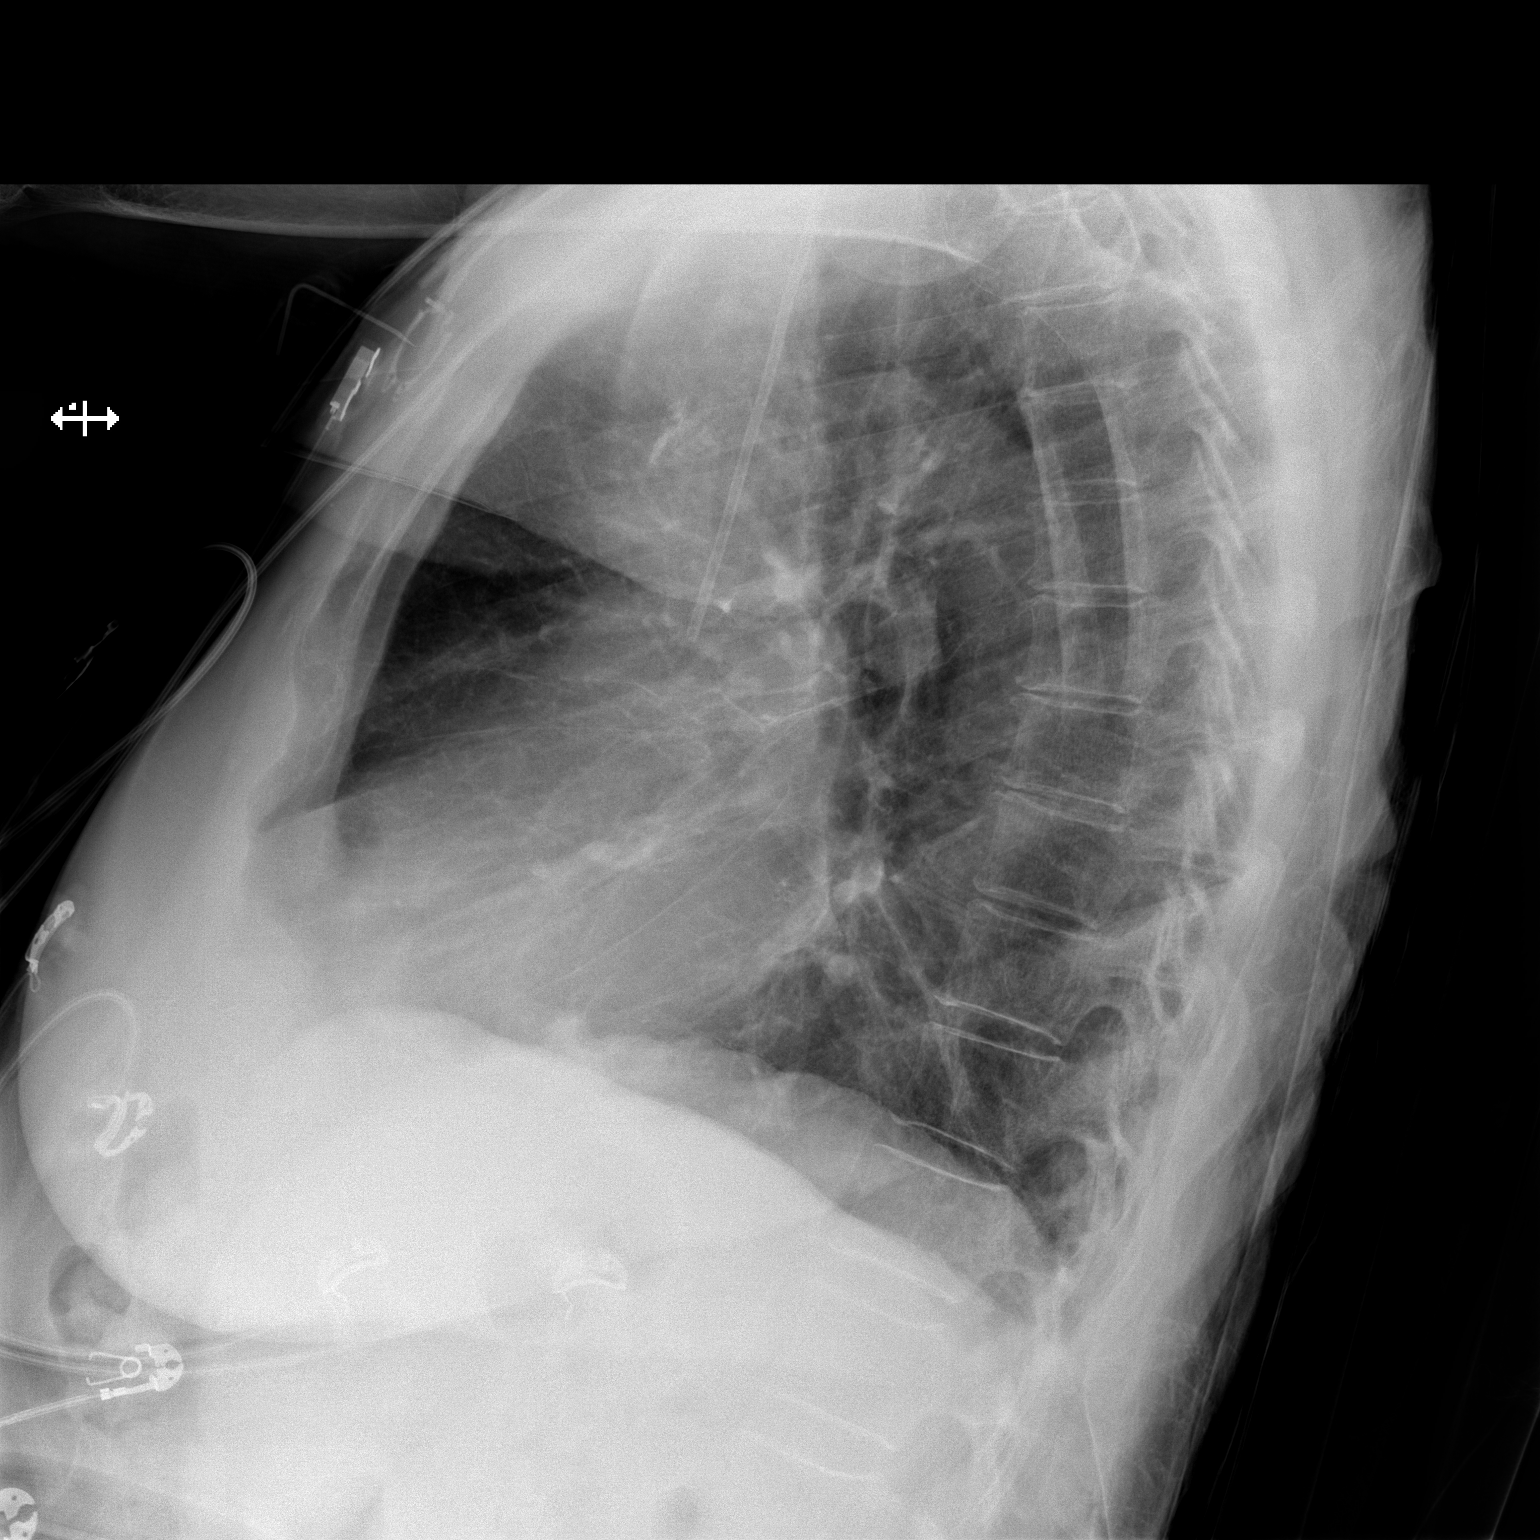

[x chest ap]
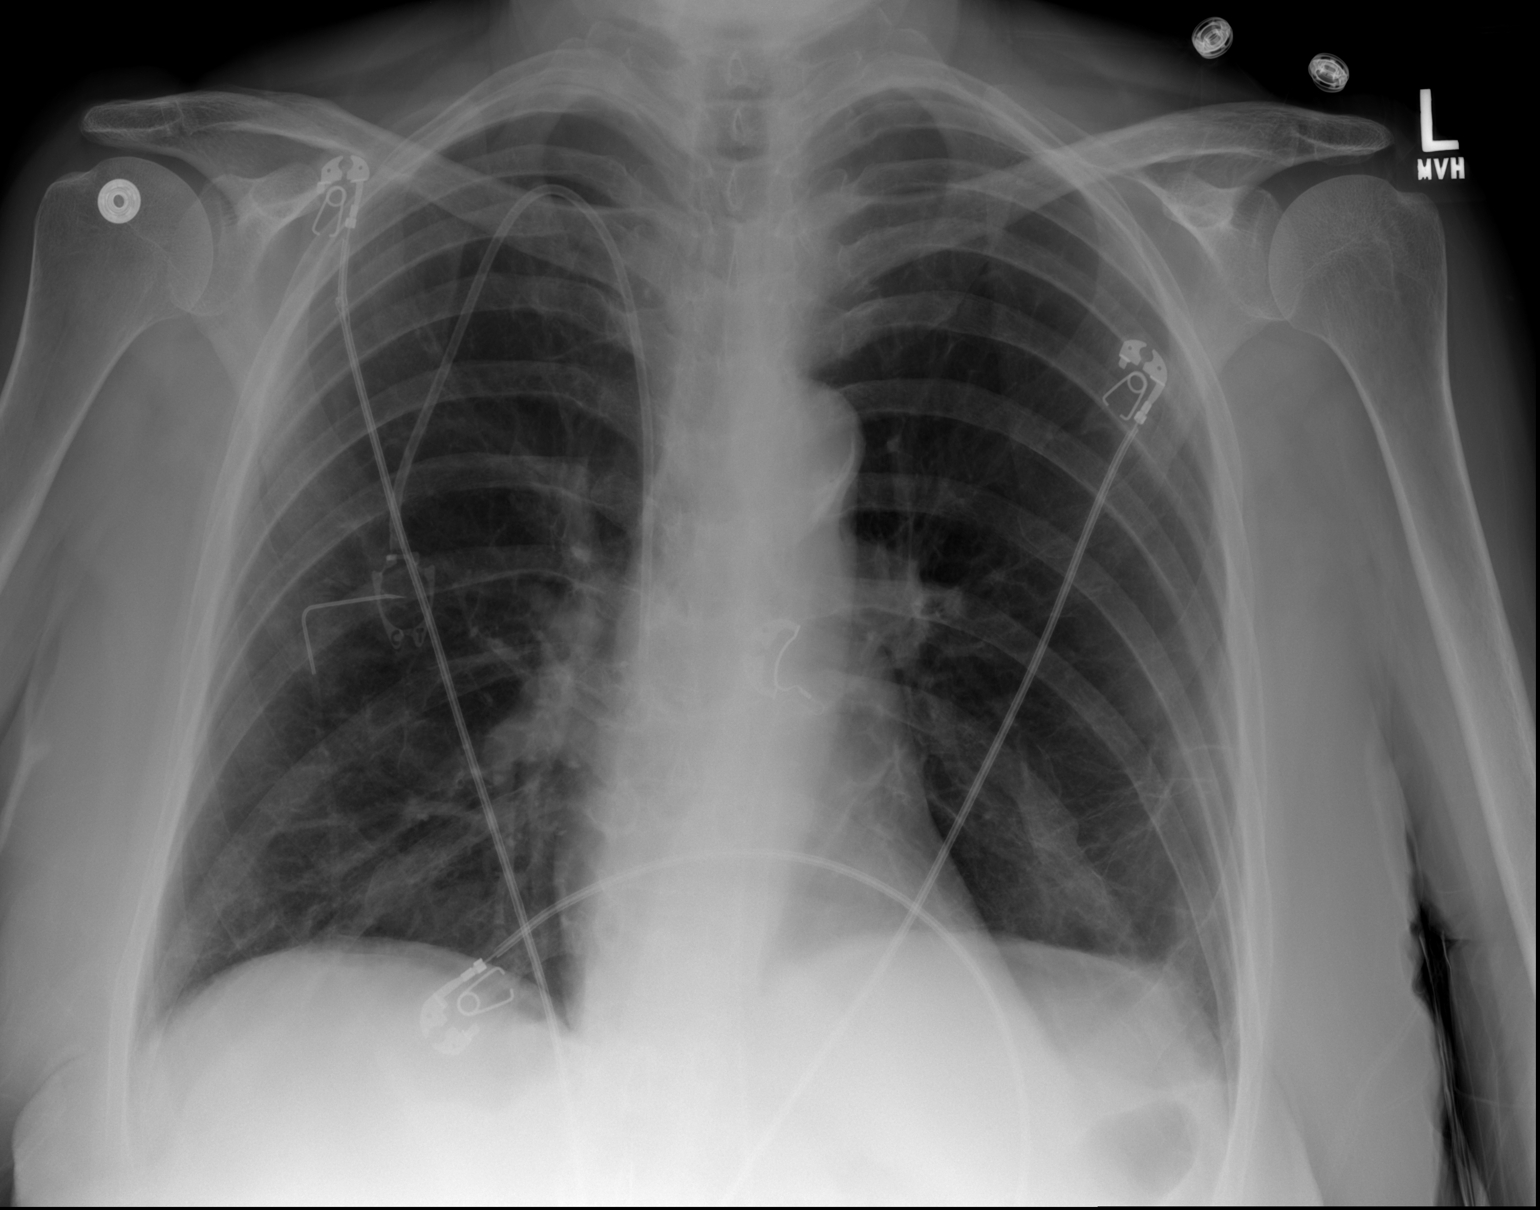

[2 of 2 positions shown; findings below may reference images not displayed]

FINDINGS: Coarse linear opacities in the lung bases left greater than right as
before. No confluent airspace disease or overt edema.

Heart size normal.  Aortic Atherosclerosis ([id number]-170.0).

No pneumothorax. No pleural effusion. Right IJ port catheter to the
distal SVC.

Visualized bones unremarkable.
IMPRESSION: No acute cardiopulmonary disease.

## 2020-04-11 IMAGING — CT CT ANGIOGRAPHY CHEST
2 of 6 series · 18 of 46 positions shown · IV contrast (OMNIPAQUE)
Comparison: PET-CT January 25, 2019; chest radiograph February 25, 2019 prior chest CT with contrast July 19, 2018

CLINICAL DATA: Shortness of breath and fever.  Esophageal carcinoma

EXAM:
CT ANGIOGRAPHY CHEST WITH CONTRAST
TECHNIQUE: Multidetector CT imaging of the chest was performed using the
standard protocol during bolus administration of intravenous
contrast. Multiplanar CT image reconstructions and MIPs were
obtained to evaluate the vascular anatomy.
CONTRAST:  100mL OMNIPAQUE IOHEXOL 350 MG/ML SOLN

[Series 5: thins · axial · 0.73mm/px · z∈[-316,-45]mm · 15 of 297 slices shown]
[im 13/297  lung]
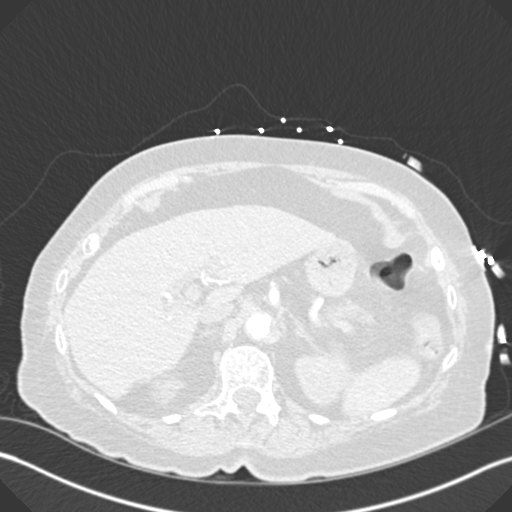
[im 39/297  soft-tissue]
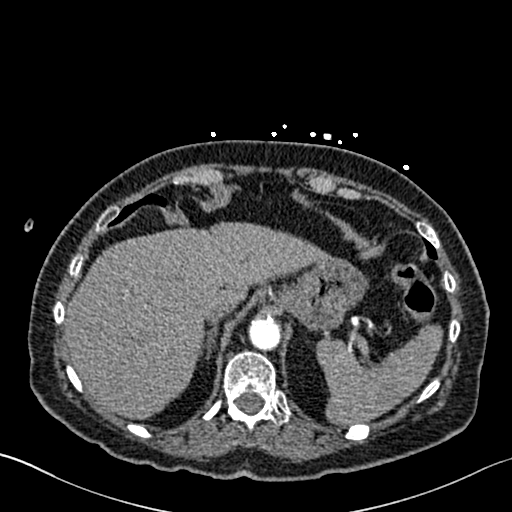
[im 52/297  lung]
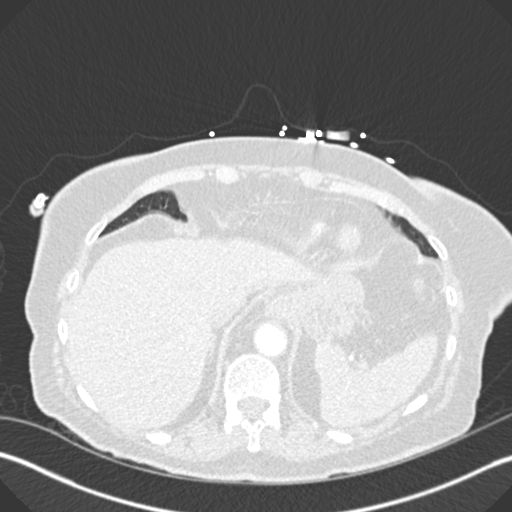
[im 78/297  soft-tissue]
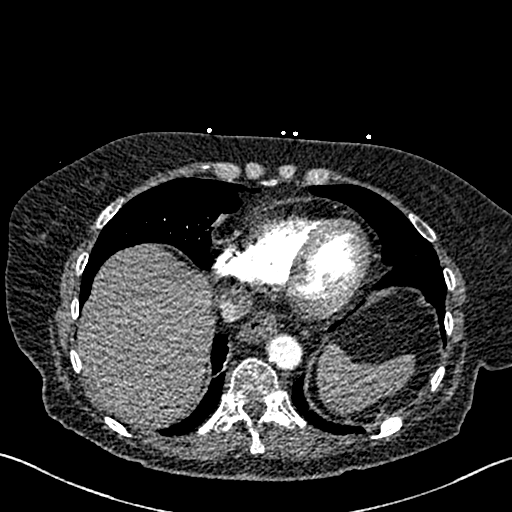
[im 91/297  lung]
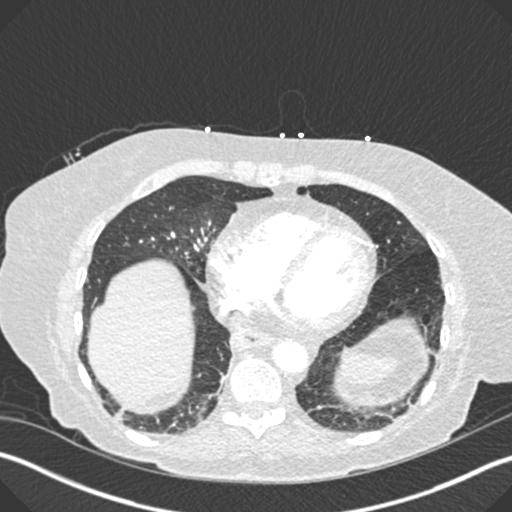
[im 116/297  soft-tissue]
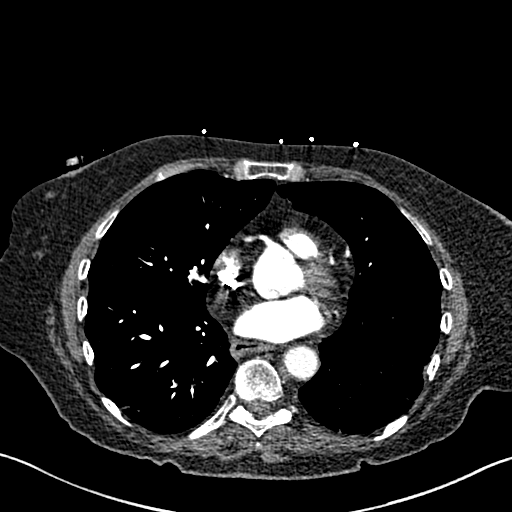
[im 129/297  lung]
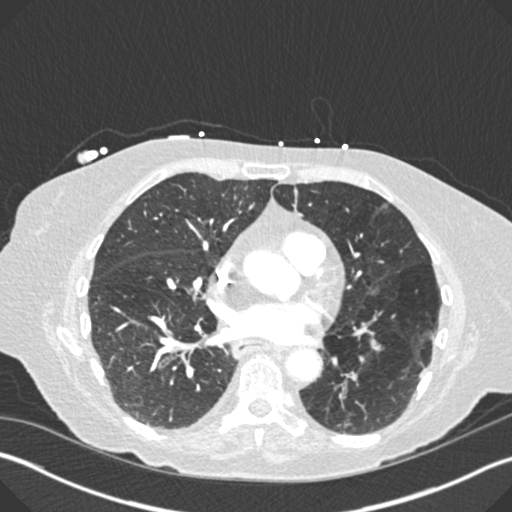
[im 155/297  soft-tissue]
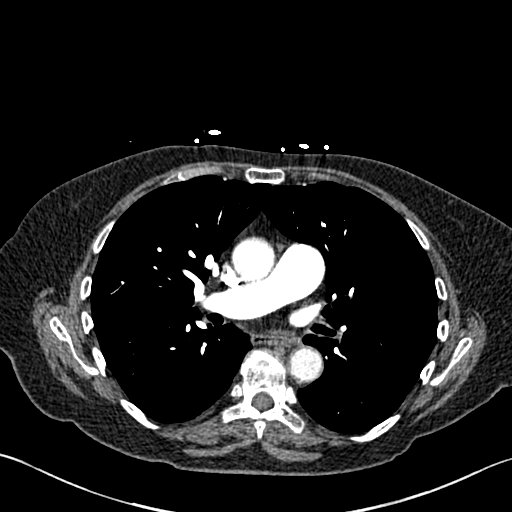
[im 168/297  lung]
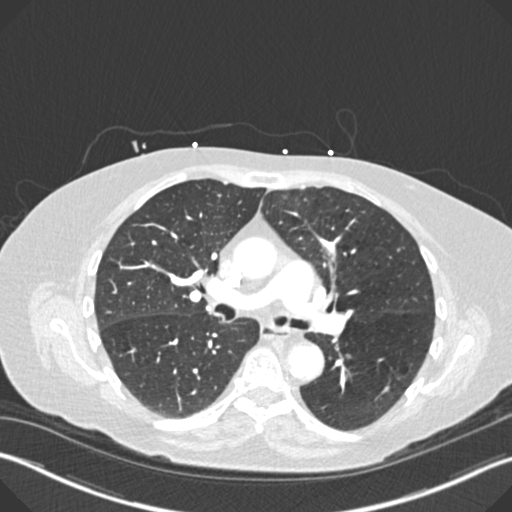
[im 181/297  soft-tissue]
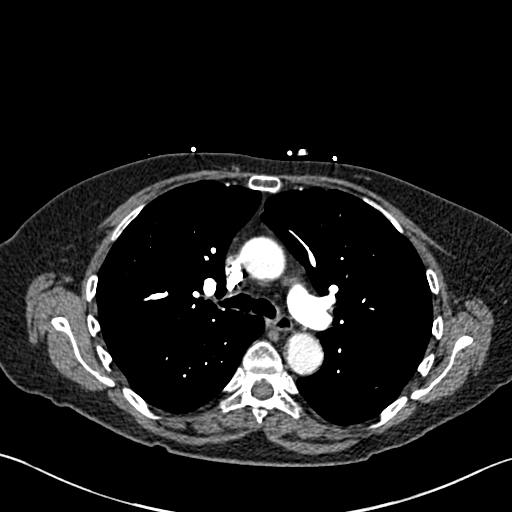
[im 206/297  lung]
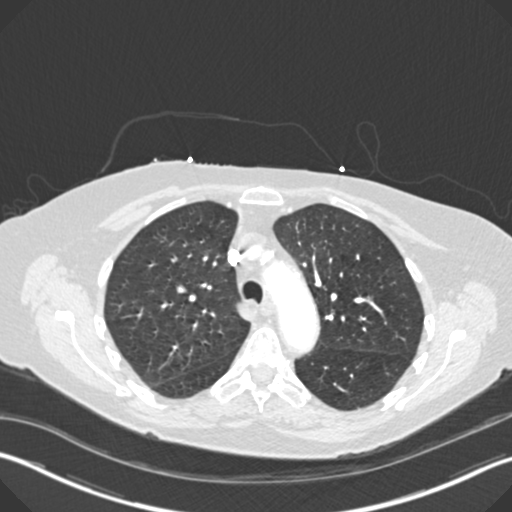
[im 219/297  soft-tissue]
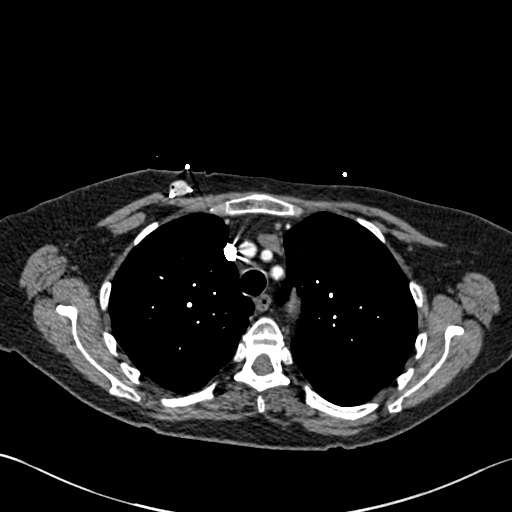
[im 245/297  lung]
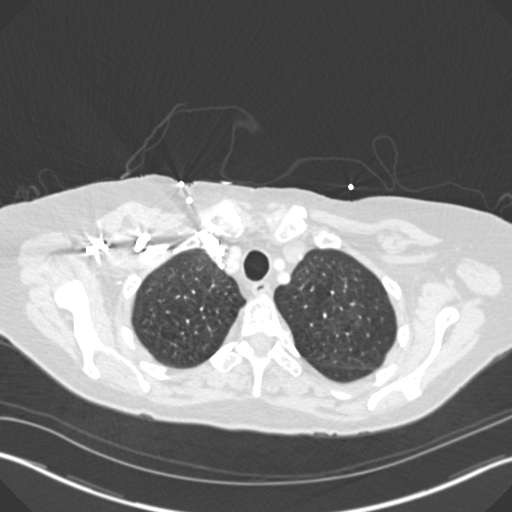
[im 258/297  soft-tissue]
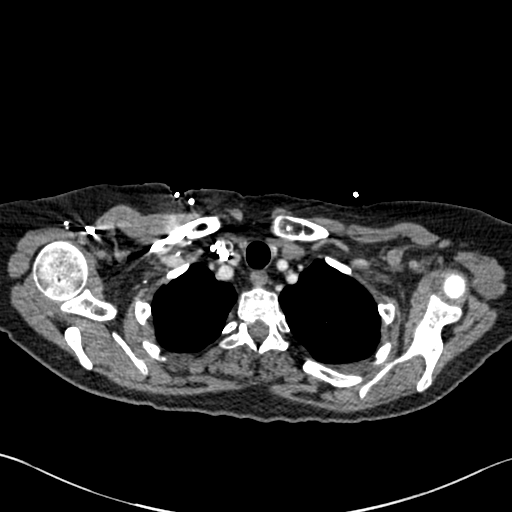
[im 284/297  lung]
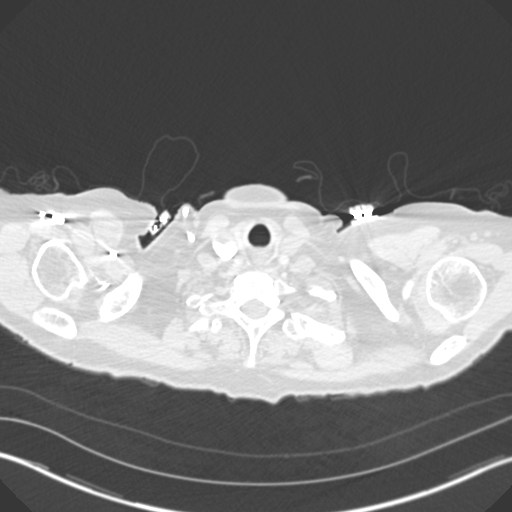

[Series 7: coronal mpr · coronal · 0.60mm/px · 3 of 123 slices shown]
[im 31/123  soft-tissue]
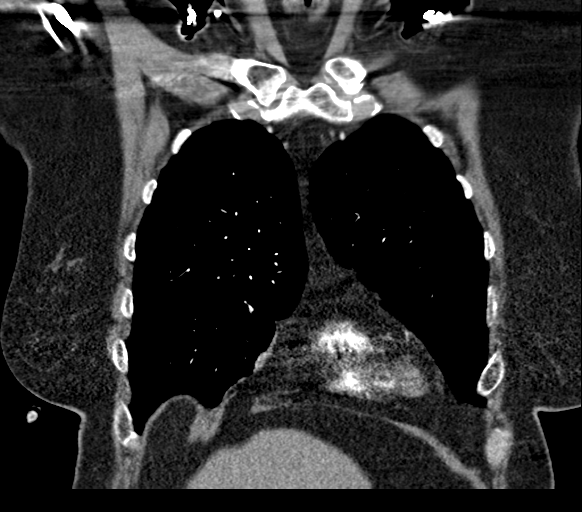
[im 62/123  soft-tissue]
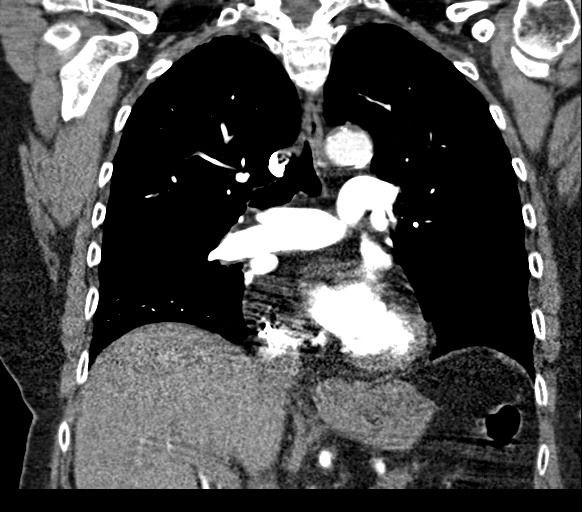
[im 92/123  soft-tissue]
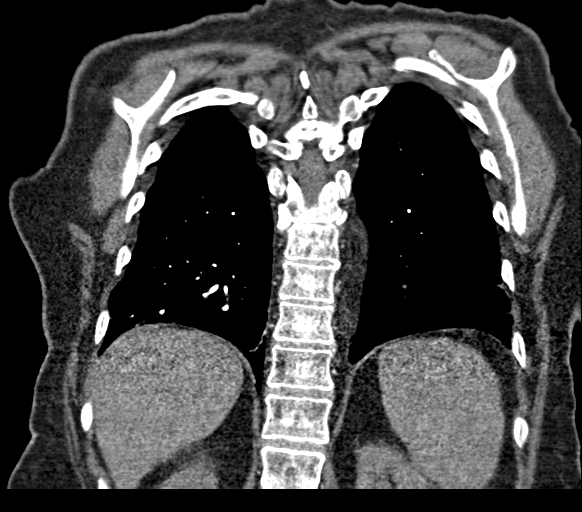

[18 of 46 positions shown; findings below may reference images not displayed]

FINDINGS: Cardiovascular: No pulmonary embolus evident. Note that there is
pulmonary vascular attenuation in the left lower lobe, also present
previously. There is no thoracic aortic aneurysm or dissection.
There are foci of calcification in the proximal visualized great
vessels. There are foci aortic atherosclerosis. There are foci of
coronary artery calcification. No pericardial effusion or
pericardial thickening is evident.

Mediastinum/Nodes: Thyroid appears somewhat diminutive but
unremarkable. There is no appreciable thoracic adenopathy. There is
thickening in the wall of the distal esophagus at the site of known
esophageal carcinoma.

Lungs/Pleura: There is underlying centrilobular emphysematous
change. There are areas of lower lobe atelectatic change. No edema
or consolidation. No pleural effusion or pleural thickening evident.
There is a 4 mm nodular opacity in the lateral segment of the right
lower lobe, also seen on recent studies.

Upper Abdomen: There is aortic atherosclerosis in the upper abdomen
region. Visualized upper abdominal structures otherwise appear
unremarkable.

Musculoskeletal: No blastic or lytic bone lesions evident. No
appreciable chest wall lesions.

Review of the MIP images confirms the above findings.
IMPRESSION: 1. No demonstrable pulmonary embolus. No thoracic aortic aneurysm or
dissection. There is aortic atherosclerosis. There are foci of great
vessel and coronary artery calcification.

2. Thickening of the wall of the distal esophagus at the site of
known esophageal carcinoma present.

3. Underlying centrilobular emphysematous change. Areas of
atelectatic change. 4 mm nodular opacity right lower lobe, stable. A
small metastasis cannot be excluded.

4.  No appreciable thoracic adenopathy.

Aortic Atherosclerosis ([id number].N) and Emphysema ([id number].X).

## 2020-04-16 ENCOUNTER — Other Ambulatory Visit: Payer: Self-pay | Admitting: Hematology

## 2020-04-16 DIAGNOSIS — I10 Essential (primary) hypertension: Secondary | ICD-10-CM

## 2020-04-16 NOTE — Telephone Encounter (Signed)
Please review for refill.  

## 2020-05-01 ENCOUNTER — Other Ambulatory Visit: Payer: Self-pay

## 2020-05-01 ENCOUNTER — Inpatient Hospital Stay: Payer: Medicare Other | Attending: Hematology

## 2020-05-01 ENCOUNTER — Inpatient Hospital Stay: Payer: Medicare Other

## 2020-05-01 VITALS — BP 128/72 | HR 68 | Temp 98.2°F | Resp 18

## 2020-05-01 DIAGNOSIS — K5289 Other specified noninfective gastroenteritis and colitis: Secondary | ICD-10-CM | POA: Insufficient documentation

## 2020-05-01 DIAGNOSIS — C7951 Secondary malignant neoplasm of bone: Secondary | ICD-10-CM | POA: Diagnosis not present

## 2020-05-01 DIAGNOSIS — Z95828 Presence of other vascular implants and grafts: Secondary | ICD-10-CM

## 2020-05-01 DIAGNOSIS — Z9221 Personal history of antineoplastic chemotherapy: Secondary | ICD-10-CM | POA: Insufficient documentation

## 2020-05-01 DIAGNOSIS — D500 Iron deficiency anemia secondary to blood loss (chronic): Secondary | ICD-10-CM

## 2020-05-01 DIAGNOSIS — C3490 Malignant neoplasm of unspecified part of unspecified bronchus or lung: Secondary | ICD-10-CM

## 2020-05-01 DIAGNOSIS — Z923 Personal history of irradiation: Secondary | ICD-10-CM | POA: Insufficient documentation

## 2020-05-01 DIAGNOSIS — Z7189 Other specified counseling: Secondary | ICD-10-CM

## 2020-05-01 DIAGNOSIS — R197 Diarrhea, unspecified: Secondary | ICD-10-CM

## 2020-05-01 DIAGNOSIS — C155 Malignant neoplasm of lower third of esophagus: Secondary | ICD-10-CM

## 2020-05-01 DIAGNOSIS — K529 Noninfective gastroenteritis and colitis, unspecified: Secondary | ICD-10-CM

## 2020-05-01 DIAGNOSIS — K521 Toxic gastroenteritis and colitis: Secondary | ICD-10-CM

## 2020-05-01 LAB — CMP (CANCER CENTER ONLY)
ALT: 8 U/L (ref 0–44)
AST: 20 U/L (ref 15–41)
Albumin: 3.4 g/dL — ABNORMAL LOW (ref 3.5–5.0)
Alkaline Phosphatase: 61 U/L (ref 38–126)
Anion gap: 12 (ref 5–15)
BUN: 9 mg/dL (ref 8–23)
CO2: 22 mmol/L (ref 22–32)
Calcium: 9.3 mg/dL (ref 8.9–10.3)
Chloride: 108 mmol/L (ref 98–111)
Creatinine: 0.96 mg/dL (ref 0.44–1.00)
GFR, Est AFR Am: >60 mL/min (ref >60)
GFR, Estimated: 56 mL/min — ABNORMAL LOW (ref >60)
Glucose, Bld: 106 mg/dL — ABNORMAL HIGH (ref 70–99)
Potassium: 3.6 mmol/L (ref 3.5–5.1)
Sodium: 142 mmol/L (ref 135–145)
Total Bilirubin: 0.4 mg/dL (ref 0.3–1.2)
Total Protein: 7.3 g/dL (ref 6.5–8.1)

## 2020-05-01 LAB — CBC WITH DIFFERENTIAL/PLATELET
Abs Immature Granulocytes: 0.03 10*3/uL (ref 0.00–0.07)
Basophils Absolute: 0.1 10*3/uL (ref 0.0–0.1)
Basophils Relative: 1 %
Eosinophils Absolute: 0.2 10*3/uL (ref 0.0–0.5)
Eosinophils Relative: 2 %
HCT: 37.7 % (ref 36.0–46.0)
Hemoglobin: 12.4 g/dL (ref 12.0–15.0)
Immature Granulocytes: 1 %
Lymphocytes Relative: 19 %
Lymphs Abs: 1.3 10*3/uL (ref 0.7–4.0)
MCH: 29.9 pg (ref 26.0–34.0)
MCHC: 32.9 g/dL (ref 30.0–36.0)
MCV: 90.8 fL (ref 80.0–100.0)
Monocytes Absolute: 0.6 10*3/uL (ref 0.1–1.0)
Monocytes Relative: 9 %
Neutro Abs: 4.5 10*3/uL (ref 1.7–7.7)
Neutrophils Relative %: 68 %
Platelets: 150 10*3/uL (ref 150–400)
RBC: 4.15 MIL/uL (ref 3.87–5.11)
RDW: 13.2 % (ref 11.5–15.5)
WBC: 6.5 10*3/uL (ref 4.0–10.5)
nRBC: 0 % (ref 0.0–0.2)

## 2020-05-01 MED ORDER — OCTREOTIDE ACETATE 30 MG IM KIT
PACK | INTRAMUSCULAR | Status: AC
  Filled 2020-05-01: qty 1

## 2020-05-01 MED ORDER — OCTREOTIDE ACETATE 30 MG IM KIT
30.0000 mg | PACK | Freq: Once | INTRAMUSCULAR | Status: AC
  Administered 2020-05-01: 30 mg via INTRAMUSCULAR

## 2020-05-01 NOTE — Patient Instructions (Signed)
Octreotide injection solution What is this medicine? OCTREOTIDE (ok TREE oh tide) is used to reduce blood levels of growth hormone in patients with a condition called acromegaly. This medicine also reduces flushing and watery diarrhea caused by certain types of cancer. This medicine may be used for other purposes; ask your health care provider or pharmacist if you have questions. COMMON BRAND NAME(S): Bynfezia, Sandostatin What should I tell my health care provider before I take this medicine? They need to know if you have any of these conditions:  diabetes  gallbladder disease  kidney disease  liver disease  thyroid disease  an unusual or allergic reaction to octreotide, other medicines, foods, dyes, or preservatives  pregnant or trying to get pregnant  breast-feeding How should I use this medicine? This medicine is for injection under the skin or into a vein (only in emergency situations). It is usually given by a health care professional in a hospital or clinic setting. If you get this medicine at home, you will be taught how to prepare and give this medicine. Allow the injection solution to come to room temperature before use. Do not warm it artificially. Use exactly as directed. Take your medicine at regular intervals. Do not take your medicine more often than directed. It is important that you put your used needles and syringes in a special sharps container. Do not put them in a trash can. If you do not have a sharps container, call your pharmacist or healthcare provider to get one. Talk to your pediatrician regarding the use of this medicine in children. Special care may be needed. Overdosage: If you think you have taken too much of this medicine contact a poison control center or emergency room at once. NOTE: This medicine is only for you. Do not share this medicine with others. What if I miss a dose? If you miss a dose, take it as soon as you can. If it is almost time for your  next dose, take only that dose. Do not take double or extra doses. What may interact with this medicine?  bromocriptine  certain medicines for blood pressure, heart disease, irregular heartbeat  cyclosporine  diuretics  medicines for diabetes, including insulin  quinidine This list may not describe all possible interactions. Give your health care provider a list of all the medicines, herbs, non-prescription drugs, or dietary supplements you use. Also tell them if you smoke, drink alcohol, or use illegal drugs. Some items may interact with your medicine. What should I watch for while using this medicine? Visit your doctor or health care professional for regular checks on your progress. To help reduce irritation at the injection site, use a different site for each injection and make sure the solution is at room temperature before use. This medicine may cause decreases in blood sugar. Signs of low blood sugar include chills, cool, pale skin or cold sweats, drowsiness, extreme hunger, fast heartbeat, headache, nausea, nervousness or anxiety, shakiness, trembling, unsteadiness, tiredness, or weakness. Contact your doctor or health care professional right away if you experience any of these symptoms. This medicine may increase blood sugar. Ask your healthcare provider if changes in diet or medicines are needed if you have diabetes. This medicine may cause a decrease in vitamin B12. You should make sure that you get enough vitamin B12 while you are taking this medicine. Discuss the foods you eat and the vitamins you take with your health care professional. What side effects may I notice from receiving this medicine? Side   effects that you should report to your doctor or health care professional as soon as possible:  allergic reactions like skin rash, itching or hives, swelling of the face, lips, or tongue  fast, slow, or irregular heartbeat  right upper belly pain  severe stomach pain  signs  and symptoms of high blood sugar such as being more thirsty or hungry or having to urinate more than normal. You may also feel very tired or have blurry vision.  signs and symptoms of low blood sugar such as feeling anxious; confusion; dizziness; increased hunger; unusually weak or tired; increased sweating; shakiness; cold, clammy skin; irritable; headache; blurred vision; fast heartbeat; loss of consciousness  unusually weak or tired Side effects that usually do not require medical attention (report to your doctor or health care professional if they continue or are bothersome):  diarrhea  dizziness  gas  headache  nausea, vomiting  pain, redness, or irritation at site where injected  upset stomach This list may not describe all possible side effects. Call your doctor for medical advice about side effects. You may report side effects to FDA at 1-800-FDA-1088. Where should I keep my medicine? Keep out of the reach of children. Store in a refrigerator between 2 and 8 degrees C (36 and 46 degrees F). Protect from light. Allow to come to room temperature naturally. Do not use artificial heat. If protected from light, the injection may be stored at room temperature between 20 and 30 degrees C (70 and 86 degrees F) for 14 days. After the initial use, throw away any unused portion of a multiple dose vial after 14 days. Throw away unused portions of the ampules after use. NOTE: This sheet is a summary. It may not cover all possible information. If you have questions about this medicine, talk to your doctor, pharmacist, or health care provider.  2020 Elsevier/Gold Standard (2019-06-20 13:33:09)  

## 2020-05-02 ENCOUNTER — Other Ambulatory Visit: Payer: Self-pay | Admitting: Hematology

## 2020-05-06 NOTE — Progress Notes (Signed)
HEMATOLOGY ONCOLOGY PROGRESS NOTE  Date of service:  05/07/20    Patient Care Team: Patient, No Pcp Per as PCP - General ([place]) [name], MD as Consulting Physician (Hematology and Oncology)  CHIEF COMPLAINTS/PURPOSE OF CONSULTATION:  F/u for metastatic lung cancer and esophageal cancer  DIAGNOSIS:   #1 Metastatic non-small cell lung cancer with bilateral lung nodules and large metastatic lesion in the left Ilium. 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 Also had c diff colitis - treated   Current Treatment  1) Active surveillance 2) Xgeva 171m [place] q4weeks for bone metastases. 3) Sandostatin q4weeks for diarrhea - immune colitis  Previous Treatment  For metastatic lung cancer 1 Palliative radiation therapy to the large left ilium metastases 2. IV Nivolumab x 20ycles (discontinued due to likely immune colitis) 3. Xgeva 1272mSC q4weeks for bone metastases.  For Esophageal adenocarcinoma S/p concurrent carbo/taxol + RT  HISTORY OF PRESENTING ILLNESS: (plz see my previous consultation for details of initial presentation)  INTERVAL HISTORY:   Cindy Byrd presenting today for her scheduled follow-up for metastatic lung cancer, and adenocarcinoma of the esophagus. The patient's last visit with [place] on 02/05/20. The pt reports that she is doing well overall.  The pt reports she is good. Pt has not had gotten the COVID19 vaccines. She has had no new concerns since last meeting. Her swallowing problems have not worsened or changed in any way and it is not keeping her from eating food she wants to eat. Pt is able to get endoscopy any time she would like, but prefers to wait. Sometimes she has a sharp pain in her lower right abdomen   Lab results today (05/07/20) of CBC w/diff and CMP is as follows: all values are WNL except for Potassium at 3.4, Glucose at 123, Creatinine at 1.04, Calcium at  8.4, Albumin at 3.3, GFR, Est Non Af Am at 51, GFR, Est AFR Am at 59 05/07/20 of Vitamin B12 at 2839 05/07/20 of Ferritin at 130: WNL  On review of systems, pt reports healthy appetite, healthy weight gain and denies diarrhea, changes in breathing, abdominal pain, swallowing changes, irregular bowl movements and any other symptoms.   MEDICAL HISTORY:  Past Medical History:  Diagnosis Date  . Barrett's esophagus   . Bone neoplasm 06/24/2015  . [facility]   metastatic poorly differentiated carcinoma. tumor left groin surgical removal with radiation tx.  . Cataract    BILATERAL  . Cigarette smoker two packs a day or less    Currently still smoking 2 PPD - Not interested in quitting at this time.  . Colitis 2017  . Colon polyps    hyperplastic, tubular adenomas, tubulovillous adenoma  . Cough, persistent    hx. lung cancer ? primary-being evaluated, unsure of primary site.  . Depression 06/24/2015  . Diverticulosis   . Emphysema of lung ([facility]  . Endometriosis    Hysterectomy with BSO at age [age]rs  . [place]ophageal [facility] [place]   intramucosal  . Gastritis   . GERD (gastroesophageal reflux disease)   . H/O: pneumonia   . Hiatal hernia   . Hyperlipidemia   . Hypertension 06/24/2015   likely improved incidental to 40 lbs weight loss from her neoplasm. No Longer taking med for this as of 08-06-15  . IBS (irritable bowel syndrome)   . Pain    left hip-persistent"tumor of bone"-radiation tx. 9.  .[name]  Vitamin D deficiency disease    SURGICAL HISTORY: Past Surgical History:  Procedure Laterality Date  . ABDOMINAL HYSTERECTOMY    . BALLOON DILATION N/A 10/08/2019   Procedure: BALLOON DILATION;  Surgeon: [name], MD;  Location[facility];  Service: Gastroenterology;  Laterality: N/A;  . BARTHOLIN GLAND CYST EXCISION  79 yo ago   Does not want if it was an infected cyst or tumor. Was soon as delivery  . BIOPSY  01/02/2019   Procedure: BIOPSY;  Surgeon: [name],  MD;  Location: [facility];  Service: Gastroenterology;;  . CATARACT EXTRACTION    . COLONOSCOPY W/ POLYPECTOMY     multiple times - last done 09/2014 per patient.  . ESOPHAGOGASTRODUODENOSCOPY (EGD) WITH PROPOFOL N/A 08/11/2015   Procedure: ESOPHAGOGASTRODUODENOSCOPY (EGD) WITH PROPOFOL;  Surgeon: [name], MD;  Location: [facility];  Service: Gastroenterology;  Laterality: N/A;  . ESOPHAGOGASTRODUODENOSCOPY (EGD) WITH PROPOFOL N/A 01/02/2019   Procedure: ESOPHAGOGASTRODUODENOSCOPY (EGD) WITH PROPOFOL;  Surgeon: [name], MD;  Location: [facility];  Service: Gastroenterology;  Laterality: N/A;  . ESOPHAGOGASTRODUODENOSCOPY (EGD) WITH PROPOFOL N/A 10/08/2019   Procedure: ESOPHAGOGASTRODUODENOSCOPY (EGD) WITH PROPOFOL;  Surgeon: [name], MD;  Location: [facility];  Service: Gastroenterology;  Laterality: N/A;  . FLEXIBLE SIGMOIDOSCOPY N/A 06/24/2017   Procedure: FLEXIBLE SIGMOIDOSCOPY;  Surgeon: Manus Gunning, MD;  Location: [facility];  Service: Gastroenterology;  Laterality: N/A;  . GANGLION CYST EXCISION    . KNEE ARTHROSCOPY  age about 4 yrs  . TONSILLECTOMY    . TOTAL ABDOMINAL HYSTERECTOMY W/ BILATERAL SALPINGOOPHORECTOMY  at age 9 yrs   For endometriosis    SOCIAL HISTORY: Social History   Socioeconomic History  . Marital status: Widowed    Spouse name: Not on file  . Number of children: 2  . Years of education: Not on file  . Highest education level: Not on file  Occupational History  . Not on file  Tobacco Use  . Smoking status: Former Smoker    Packs/day: 1.00    Years: 60.00    Pack years: 60.00    Types: Cigarettes    Quit date: 12/05/2014    Years since quitting: 5.4  . Smokeless tobacco: Never Used  Substance and Sexual Activity  . Alcohol use: No    Alcohol/week: 0.0 standard drinks  . Drug use: No  . Sexual activity: Not Currently  Other Topics Concern  . Not on file  Social History Narrative  . Not on file   Social  Determinants of Health   Financial Resource Strain:   . Difficulty of Paying Living Expenses:   Food Insecurity:   . Worried About Charity fundraiser in the Last Year:   . Arboriculturist in the Last Year:   Transportation Needs:   . Film/video editor (Medical):   [name] Lack of Transportation (Non-Medical):   Physical Activity:   . Days of Exercise per Week:   . Minutes of Exercise per Session:   Stress:   . Feeling of Stress :   Social Connections:   . Frequency of Communication with Friends and Family:   . Frequency of Social Gatherings with Friends and Family:   . Attends Religious Services:   . Active Member of Clubs or Organizations:   . Attends Archivist Meetings:   [name] Marital Status:   Intimate Partner Violence:   . Fear of Current or Ex-Partner:   . Emotionally Abused:   [name] Physically  Abused:   . Sexually Abused:     FAMILY HISTORY: Family History  Problem Relation Age of Onset  . Colon cancer Brother   . Colon cancer Brother   . Stroke Mother   . Colon cancer Father   . Emphysema Father        smoked  . Breast cancer Daughter 87       ER/PR+ stage II    ALLERGIES:  is allergic to penicillins; remeron [mirtazapine]; and latex. patient wonders if she has a penicillin allergy but notes that she is uncertain about this.  MEDICATIONS:  Current Outpatient Medications  Medication Sig Dispense Refill  . amLODipine (NORVASC) 5 MG tablet TAKE 1 TABLET BY MOUTH EVERY DAY 30 tablet 1  . Biotin (BIOTIN 5000) 5 MG CAPS Take 5 mg by mouth daily.    . Calcium Citrate-Vitamin D (CALCIUM + D PO) Take 1 tablet by mouth daily.    . Cyanocobalamin (B-12) 2500 MCG TABS Take 2,500 mcg by mouth daily.    [name] FLUoxetine (PROZAC) 20 MG capsule TAKE 1 CAPSULE BY MOUTH EVERY DAY 90 capsule 0  . lidocaine-prilocaine (EMLA) cream Apply to affected area once (Patient taking differently: Apply 1 application topically as needed (port access). ) 30 g 3  . omeprazole (PRILOSEC) 40  MG capsule TAKE 1 CAPSULE BY MOUTH EVERY DAY (Patient taking differently: Take 40 mg by mouth daily. ) 90 capsule 3  . traZODone (DESYREL) 50 MG tablet TAKE 1 TABLET BY MOUTH EVERYDAY AT BEDTIME 90 tablet 2  . Vitamin D, Ergocalciferol, (DRISDOL) 1.25 MG (50000 UNIT) CAPS capsule TAKE 1 CAPSULE BY MOUTH EVERY 7 DAYS 12 capsule 3  . XARELTO 20 MG TABS tablet TAKE 1 TABLET (20 MG TOTAL) BY MOUTH DAILY WITH SUPPER. 90 tablet 0   No current facility-administered medications for this visit.    REVIEW OF SYSTEMS:   A 10+ POINT REVIEW OF SYSTEMS WAS OBTAINED including neurology, dermatology, psychiatry, cardiac, respiratory, lymph, extremities, GI, GU, Musculoskeletal, constitutional, breasts, reproductive, HEENT.  All pertinent positives are noted in the HPI.  All others are negative.   PHYSICAL EXAMINATION: ECOG PERFORMANCE STATUS: 2 - Symptomatic, <50% confined to bed  Vitals:   05/07/20 0948  BP: (!) 146/75  Pulse: 71  Resp: 18  Temp: (!) 97.5 F (36.4 C)  SpO2: 99%   Filed Weights   05/07/20 0948  Weight: 141 lb 12.8 oz (64.3 kg)  .  Wt Readings from Last 3 Encounters:  05/07/20 141 lb 12.8 oz (64.3 kg)  02/05/20 137 lb 12.8 oz (62.5 kg)  12/04/19 140 lb 11.2 oz (63.8 kg)   GENERAL:alert, in no acute distress and comfortable SKIN: no acute rashes, no significant lesions EYES: conjunctiva are pink and non-injected, sclera anicteric OROPHARYNX: MMM, no exudates, no oropharyngeal erythema or ulceration NECK: supple, no JVD LYMPH:  no palpable lymphadenopathy in the cervical, axillary or inguinal regions LUNGS: clear to auscultation b/l with normal respiratory effort HEART: regular rate & rhythm ABDOMEN:  normoactive bowel sounds , non tender, not distended. Extremity: no pedal edema PSYCH: alert & oriented x 3 with fluent speech NEURO: no focal motor/sensory deficits  LABORATORY DATA:  I have reviewed the data as listed  . CBC Latest Ref Rng & Units 05/07/2020 05/01/2020  12/13/2019  WBC 4.0 - 10.5 K/uL 5.2 6.5 5.6  Hemoglobin 12.0 - 15.0 g/dL 12.2 12.4 11.9  Hematocrit 36.0 - 46.0 % 36.8 37.7 37.9  Platelets 150 - 400 K/uL 157 150 187  ANC 1.8k . CMP Latest Ref Rng & Units 05/07/2020 05/01/2020 04/03/2020  Glucose 70 - 99 mg/dL 123(H) 106(H) 117(H)  BUN 8 - 23 mg/dL 10 9 14   Creatinine 0.44 - 1.00 mg/dL 1.04(H) 0.96 1.18(H)  Sodium 135 - 145 mmol/L 143 142 142  Potassium 3.5 - 5.1 mmol/L 3.4(L) 3.6 4.2  Chloride 98 - 111 mmol/L 111 108 107  CO2 22 - 32 mmol/L 23 22 27   Calcium 8.9 - 10.3 mg/dL 8.4(L) 9.3 9.5  Total Protein 6.5 - 8.1 g/dL 7.0 7.3 7.9  Total Bilirubin 0.3 - 1.2 mg/dL 0.3 0.4 0.5  Alkaline Phos 38 - 126 U/L 60 61 67  AST 15 - 41 U/L 16 20 17   ALT 0 - 44 U/L 7 8 7        01/02/19 Esophagus Biopsy:    RADIOGRAPHIC STUDIES:  .No results found.  ASSESSMENT & PLAN:   79 y.o. female with  #1 Metastatic poorly differentiated carcinoma with likely lung primary non-small cell lung cancer.   CT of the head with and without contrast showed no evidence of metastatic disease. EGFR blood test mutation analysis negative. CT chest abdomen pelvis 04/19/2016 shows no evidence of disease progression. Patient tolerated Nivolumab very well but was discontinued when she developed grade 2 Immune colitis. Has been off Nivolumab for >6 months  CT chest abdomen pelvis on 06/24/2016 shows no evidence of new disease or progression of metastatic disease. CT chest abdomen pelvis 09/06/2016 shows 1. Mixed interval response to therapy. 2. There is a new left ventral chest wall lesion deep to the pectoralis musculature worrisome for metastatic disease. 3. Posterior lower lobe nodular densities are identified which may reflect areas of pulmonary metastasis. 4. Interval decrease in size of destructive lesion involving the left iliac bone.  CT chest abd pelvis 12/08/2016: Cystic mass involving the left ventral chest wall has resolved in the interval. Likely was a  hematoma due to trauma. Interval increase in size of pleural base mass overlying the posterior and inferior left lower lobe. There is also a new left pleural effusion identified.  CT chest 02/01/2017: Residual irregular soft tissue thickening/volume loss and trace left pleural fluid at the base of the left hemithorax, overall improved in appearance from 12/08/2016. No measurable lesion.  CT chest 05/29/2017 shows no residual pleural based mass or significant pleural effusion in the left hemithorax. No evidence of thoracic metastatic disease. No evidence of progressive metastatic disease within the abdomen or pelvis. Mixed lytic and blastic lesion involving the left iliac bone and associated pathologic fracture are unchanged.   CT CAP 09/14/17 shows no new changes. She does have slight displacement of her fractured left iliac bone. Evidence of stable disease.   CT CAP 01/04/2018- No new or progressive metastatic disease. Stable large left iliac bone metastasis with associated chronic pathologic fracture.   CT chest/abd/pelvis done on 04/26/18 revealed Stable exam.  No new or progressive interval findings.  07/19/18 CT C/A/P revealed Stable exam.  No new or progressive interval findings. Large destructive left iliac lesion is similar to prior. Aortic Atherosclerosis and Emphysema.    11/06/18 CT C/A/P revealed Similar appearance of large mixed lytic and sclerotic lesion in the left ilium. No new metastatic lesions are otherwise noted elsewhere in the chest, abdomen or pelvis. 2. Interval development of thickening of the distal third of the esophagus. This is nonspecific, and could be related to underlying reflux esophagitis. However, if there is any clinical concern for Barrett's metaplasia or esophageal neoplasia,  further evaluation with nonemergent endoscopy could be considered. 3. Aortic atherosclerosis, in addition to left main coronary artery disease. Assessment for potential risk factor modification,  dietary therapy or pharmacologic therapy may be warranted, if clinically Indicated. 4. Diffuse bronchial wall thickening with mild to moderate centrilobular and paraseptal emphysema; imaging findings suggestive of underlying COPD. 5. Additional incidental findings, as above.   #2  Adenocarcinoma of the Esophagus  Barrett's esophagus 4cms in the distal esophagus with low and high-grade dysplasia  01/02/19 Surgical pathology revealed adenocarcinoma of the esophagus   01/25/19 PET/CT revealed Distal esophageal primary, without hypermetabolic metastatic disease. 2. Chronic left iliac metastasis, as before. 3. Hypermetabolism within and superficial to the right gluteal musculature is most likely related to trauma and/or injection sites. 4. Aortic atherosclerosis, coronary artery atherosclerosis and emphysema.  S/p concurrent Carboplatin and Taxol weekly with RT of 45 Gy in 25 fractions and 5.4 Gy boost, completed between 02/04/19 and 03/27/19  #3 diarrhea-  now resolved was previously. 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 She was previously on on Lialda, budesonide,probiotics and lomotil but not currently taking any of these. Plan -Continue Sandostatin every 4 weeks   #4 h/o DVT and PE  -continue on Xarelto - no issues with bleeding   #6 Dsypnea 03/14/19 ECHO revealed LV EF of 60-65% 03/06/19 CXR revealed clear lung fields, normal heart size 03/07/19 and 02/25/19 EKGs, no overt concern but some decreased QRS amplitude Did refer to pulmonology for further evaluation and lung function testing  Began steroid inhaler to mitigate possible inflammation in airway, could be some radiation related scarring and emphysema  07/03/2019 PET skull base to thigh revealed "1. Interval response to therapy. Significant reduction in FDG uptake associated with distal esophageal mass. SUV max currently  2.61 versus 16.97 previously. 2. 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 Unchanged 3. Aortic Atherosclerosis (ICD10-I70.0) and Emphysema (ICD10-J43.9). Coronary artery calcifications."  PLAN: -Discussed pt labwork today, 05/07/20; of CBC w/diff and CMP is as follows: all values are WNL except for Potassium at 3.4, Glucose at 123, Creatinine at 1.04, Calcium at 8.4, Albumin at 3.3, GFR, Est Non Af Am at 51, GFR, Est AFR Am at 59 -Discussed 05/07/20 of Vitamin B12 at 2839 -Discussed 05/07/20 of Ferritin at 130: WNL -Advised on COVID19 vaccines  -Advised on hydration- non-caffinated, non-carbonated, non-alcoholic  -Recommends getting the COVID19 vaccine  -The pt shows no lab, clinical, or radiographic evidence of Metastatic Lung Cancer or Esophageal Adenocarcinoma at this time. -No indication to treat at this time - will continue to monitor with labs, clinical visits, and scans -Recommend pt receive a repeat upper endoscopy every 4-6 months  -Recommends eating potassium rich foods  -Continue 2000 units Vitamin D every day, with dose adjustment to maintain 25OH Vit D levels of 40-60 -Continue Trazodone for sleep difficulty -Continue PO Magnesium replacement  -Continue 20m Marinol BID -Recommend pt f/u with Dr. PHilarie Fredricksonfor EGD -Recommends staying hydrated  -Will see back in 3 months  -Will get CT Chest/Adb/Pelvis in 10 weeks   FOLLOW UP: -continue Sandostatin q4weeks plz schedule next 4 doses -change Xgeva to every 12 weeks with labs - plz schedule next 3 doses. -portflush with each lab -CT chest/abd/pelvis in 10 weeks -MD visit in 3 months with portflush and labs   The total time spent in the appt was 20 minutes and more than  50% was on counseling and direct patient cares.  All of the patient's questions were answered with apparent satisfaction. The patient knows to call the clinic with any problems, questions or concerns.  Sullivan Lone MD [facility]  Physician [facility]  (Office): [phone] (Work cell): [phone] (Fax): [phone]  I, Dawayne Cirri am acting as a scribe for Dr. Sullivan Lone.   .I have reviewed the above documentation for accuracy and completeness, and I agree with the above. [name] MD

## 2020-05-07 ENCOUNTER — Inpatient Hospital Stay: Payer: Medicare Other

## 2020-05-07 ENCOUNTER — Inpatient Hospital Stay: Payer: Medicare Other | Attending: Hematology

## 2020-05-07 ENCOUNTER — Inpatient Hospital Stay: Payer: Medicare Other | Admitting: Hematology

## 2020-05-07 ENCOUNTER — Other Ambulatory Visit: Payer: Self-pay

## 2020-05-07 ENCOUNTER — Encounter: Payer: Self-pay | Admitting: Hematology

## 2020-05-07 VITALS — BP 146/75 | HR 71 | Temp 97.5°F | Resp 18 | Ht 68.0 in | Wt 141.8 lb

## 2020-05-07 DIAGNOSIS — K5289 Other specified noninfective gastroenteritis and colitis: Secondary | ICD-10-CM | POA: Diagnosis not present

## 2020-05-07 DIAGNOSIS — C3491 Malignant neoplasm of unspecified part of right bronchus or lung: Secondary | ICD-10-CM

## 2020-05-07 DIAGNOSIS — Z9079 Acquired absence of other genital organ(s): Secondary | ICD-10-CM | POA: Diagnosis not present

## 2020-05-07 DIAGNOSIS — C7951 Secondary malignant neoplasm of bone: Secondary | ICD-10-CM | POA: Insufficient documentation

## 2020-05-07 DIAGNOSIS — Z90722 Acquired absence of ovaries, bilateral: Secondary | ICD-10-CM | POA: Diagnosis not present

## 2020-05-07 DIAGNOSIS — Z7901 Long term (current) use of anticoagulants: Secondary | ICD-10-CM | POA: Diagnosis not present

## 2020-05-07 DIAGNOSIS — Z86718 Personal history of other venous thrombosis and embolism: Secondary | ICD-10-CM | POA: Insufficient documentation

## 2020-05-07 DIAGNOSIS — Z800 Family history of malignant neoplasm of digestive organs: Secondary | ICD-10-CM | POA: Diagnosis not present

## 2020-05-07 DIAGNOSIS — C3490 Malignant neoplasm of unspecified part of unspecified bronchus or lung: Secondary | ICD-10-CM

## 2020-05-07 DIAGNOSIS — Z87891 Personal history of nicotine dependence: Secondary | ICD-10-CM | POA: Insufficient documentation

## 2020-05-07 DIAGNOSIS — E785 Hyperlipidemia, unspecified: Secondary | ICD-10-CM | POA: Diagnosis not present

## 2020-05-07 DIAGNOSIS — Z86711 Personal history of pulmonary embolism: Secondary | ICD-10-CM | POA: Diagnosis not present

## 2020-05-07 DIAGNOSIS — Z803 Family history of malignant neoplasm of breast: Secondary | ICD-10-CM | POA: Insufficient documentation

## 2020-05-07 DIAGNOSIS — Z90710 Acquired absence of both cervix and uterus: Secondary | ICD-10-CM | POA: Diagnosis not present

## 2020-05-07 DIAGNOSIS — Z79899 Other long term (current) drug therapy: Secondary | ICD-10-CM | POA: Diagnosis not present

## 2020-05-07 DIAGNOSIS — C155 Malignant neoplasm of lower third of esophagus: Secondary | ICD-10-CM

## 2020-05-07 LAB — VITAMIN B12: Vitamin B-12: 2839 pg/mL — ABNORMAL HIGH (ref 180–914)

## 2020-05-07 LAB — CBC WITH DIFFERENTIAL/PLATELET
Abs Immature Granulocytes: 0.02 10*3/uL (ref 0.00–0.07)
Basophils Absolute: 0.1 10*3/uL (ref 0.0–0.1)
Basophils Relative: 1 %
Eosinophils Absolute: 0.1 10*3/uL (ref 0.0–0.5)
Eosinophils Relative: 3 %
HCT: 36.8 % (ref 36.0–46.0)
Hemoglobin: 12.2 g/dL (ref 12.0–15.0)
Immature Granulocytes: 0 %
Lymphocytes Relative: 21 %
Lymphs Abs: 1.1 10*3/uL (ref 0.7–4.0)
MCH: 30.6 pg (ref 26.0–34.0)
MCHC: 33.2 g/dL (ref 30.0–36.0)
MCV: 92.2 fL (ref 80.0–100.0)
Monocytes Absolute: 0.4 10*3/uL (ref 0.1–1.0)
Monocytes Relative: 8 %
Neutro Abs: 3.5 10*3/uL (ref 1.7–7.7)
Neutrophils Relative %: 67 %
Platelets: 157 10*3/uL (ref 150–400)
RBC: 3.99 MIL/uL (ref 3.87–5.11)
RDW: 13.3 % (ref 11.5–15.5)
WBC: 5.2 10*3/uL (ref 4.0–10.5)
nRBC: 0 % (ref 0.0–0.2)

## 2020-05-07 LAB — CMP (CANCER CENTER ONLY)
ALT: 7 U/L (ref 0–44)
AST: 16 U/L (ref 15–41)
Albumin: 3.3 g/dL — ABNORMAL LOW (ref 3.5–5.0)
Alkaline Phosphatase: 60 U/L (ref 38–126)
Anion gap: 9 (ref 5–15)
BUN: 10 mg/dL (ref 8–23)
CO2: 23 mmol/L (ref 22–32)
Calcium: 8.4 mg/dL — ABNORMAL LOW (ref 8.9–10.3)
Chloride: 111 mmol/L (ref 98–111)
Creatinine: 1.04 mg/dL — ABNORMAL HIGH (ref 0.44–1.00)
GFR, Est AFR Am: 59 mL/min — ABNORMAL LOW (ref >60)
GFR, Estimated: 51 mL/min — ABNORMAL LOW (ref >60)
Glucose, Bld: 123 mg/dL — ABNORMAL HIGH (ref 70–99)
Potassium: 3.4 mmol/L — ABNORMAL LOW (ref 3.5–5.1)
Sodium: 143 mmol/L (ref 135–145)
Total Bilirubin: 0.3 mg/dL (ref 0.3–1.2)
Total Protein: 7 g/dL (ref 6.5–8.1)

## 2020-05-07 LAB — FERRITIN: Ferritin: 130 ng/mL (ref 11–307)

## 2020-05-08 ENCOUNTER — Telehealth: Payer: Self-pay | Admitting: Oncology

## 2020-05-08 NOTE — Telephone Encounter (Signed)
Opened by accident, please disregard.

## 2020-05-13 ENCOUNTER — Other Ambulatory Visit: Payer: Self-pay | Admitting: Hematology

## 2020-05-13 DIAGNOSIS — I10 Essential (primary) hypertension: Secondary | ICD-10-CM

## 2020-05-29 ENCOUNTER — Inpatient Hospital Stay: Payer: Medicare Other

## 2020-05-29 ENCOUNTER — Other Ambulatory Visit: Payer: Self-pay

## 2020-05-29 VITALS — BP 138/77 | HR 77 | Temp 98.1°F | Resp 18

## 2020-05-29 DIAGNOSIS — Z803 Family history of malignant neoplasm of breast: Secondary | ICD-10-CM | POA: Diagnosis not present

## 2020-05-29 DIAGNOSIS — Z79899 Other long term (current) drug therapy: Secondary | ICD-10-CM | POA: Diagnosis not present

## 2020-05-29 DIAGNOSIS — Z86711 Personal history of pulmonary embolism: Secondary | ICD-10-CM | POA: Diagnosis not present

## 2020-05-29 DIAGNOSIS — Z95828 Presence of other vascular implants and grafts: Secondary | ICD-10-CM

## 2020-05-29 DIAGNOSIS — C155 Malignant neoplasm of lower third of esophagus: Secondary | ICD-10-CM

## 2020-05-29 DIAGNOSIS — Z7189 Other specified counseling: Secondary | ICD-10-CM

## 2020-05-29 DIAGNOSIS — C7951 Secondary malignant neoplasm of bone: Secondary | ICD-10-CM | POA: Diagnosis not present

## 2020-05-29 DIAGNOSIS — Z7901 Long term (current) use of anticoagulants: Secondary | ICD-10-CM | POA: Diagnosis not present

## 2020-05-29 DIAGNOSIS — K5289 Other specified noninfective gastroenteritis and colitis: Secondary | ICD-10-CM | POA: Diagnosis not present

## 2020-05-29 DIAGNOSIS — Z86718 Personal history of other venous thrombosis and embolism: Secondary | ICD-10-CM | POA: Diagnosis not present

## 2020-05-29 DIAGNOSIS — R197 Diarrhea, unspecified: Secondary | ICD-10-CM

## 2020-05-29 DIAGNOSIS — K521 Toxic gastroenteritis and colitis: Secondary | ICD-10-CM

## 2020-05-29 DIAGNOSIS — E785 Hyperlipidemia, unspecified: Secondary | ICD-10-CM | POA: Diagnosis not present

## 2020-05-29 DIAGNOSIS — C3490 Malignant neoplasm of unspecified part of unspecified bronchus or lung: Secondary | ICD-10-CM

## 2020-05-29 DIAGNOSIS — Z87891 Personal history of nicotine dependence: Secondary | ICD-10-CM | POA: Diagnosis not present

## 2020-05-29 DIAGNOSIS — Z800 Family history of malignant neoplasm of digestive organs: Secondary | ICD-10-CM | POA: Diagnosis not present

## 2020-05-29 DIAGNOSIS — K529 Noninfective gastroenteritis and colitis, unspecified: Secondary | ICD-10-CM

## 2020-05-29 MED ORDER — OCTREOTIDE ACETATE 30 MG IM KIT
PACK | INTRAMUSCULAR | Status: AC
  Filled 2020-05-29: qty 1

## 2020-05-29 MED ORDER — OCTREOTIDE ACETATE 30 MG IM KIT
30.0000 mg | PACK | Freq: Once | INTRAMUSCULAR | Status: AC
  Administered 2020-05-29: 30 mg via INTRAMUSCULAR

## 2020-06-03 ENCOUNTER — Ambulatory Visit: Payer: Medicare Other | Admitting: Physician Assistant

## 2020-06-04 ENCOUNTER — Ambulatory Visit: Payer: Medicare Other

## 2020-06-11 ENCOUNTER — Other Ambulatory Visit: Payer: Self-pay | Admitting: Hematology

## 2020-06-11 DIAGNOSIS — I10 Essential (primary) hypertension: Secondary | ICD-10-CM

## 2020-06-11 NOTE — Telephone Encounter (Signed)
Please review for refill.  

## 2020-06-26 ENCOUNTER — Inpatient Hospital Stay: Payer: Medicare Other | Attending: Hematology

## 2020-06-26 ENCOUNTER — Inpatient Hospital Stay: Payer: Medicare Other

## 2020-06-26 ENCOUNTER — Other Ambulatory Visit: Payer: Self-pay

## 2020-06-26 ENCOUNTER — Ambulatory Visit: Payer: Medicare Other

## 2020-06-26 VITALS — BP 129/75 | HR 73 | Temp 98.0°F | Resp 18

## 2020-06-26 DIAGNOSIS — C3491 Malignant neoplasm of unspecified part of right bronchus or lung: Secondary | ICD-10-CM

## 2020-06-26 DIAGNOSIS — Z7189 Other specified counseling: Secondary | ICD-10-CM

## 2020-06-26 DIAGNOSIS — K529 Noninfective gastroenteritis and colitis, unspecified: Secondary | ICD-10-CM

## 2020-06-26 DIAGNOSIS — Z95828 Presence of other vascular implants and grafts: Secondary | ICD-10-CM

## 2020-06-26 DIAGNOSIS — K521 Toxic gastroenteritis and colitis: Secondary | ICD-10-CM

## 2020-06-26 DIAGNOSIS — C7951 Secondary malignant neoplasm of bone: Secondary | ICD-10-CM | POA: Diagnosis not present

## 2020-06-26 DIAGNOSIS — C3490 Malignant neoplasm of unspecified part of unspecified bronchus or lung: Secondary | ICD-10-CM | POA: Diagnosis not present

## 2020-06-26 DIAGNOSIS — K5289 Other specified noninfective gastroenteritis and colitis: Secondary | ICD-10-CM | POA: Diagnosis not present

## 2020-06-26 DIAGNOSIS — C155 Malignant neoplasm of lower third of esophagus: Secondary | ICD-10-CM

## 2020-06-26 DIAGNOSIS — R197 Diarrhea, unspecified: Secondary | ICD-10-CM

## 2020-06-26 DIAGNOSIS — D500 Iron deficiency anemia secondary to blood loss (chronic): Secondary | ICD-10-CM

## 2020-06-26 LAB — CMP (CANCER CENTER ONLY)
ALT: 8 U/L (ref 0–44)
AST: 23 U/L (ref 15–41)
Albumin: 3.5 g/dL (ref 3.5–5.0)
Alkaline Phosphatase: 54 U/L (ref 38–126)
Anion gap: 12 (ref 5–15)
BUN: 14 mg/dL (ref 8–23)
CO2: 22 mmol/L (ref 22–32)
Calcium: 9.5 mg/dL (ref 8.9–10.3)
Chloride: 108 mmol/L (ref 98–111)
Creatinine: 1.07 mg/dL — ABNORMAL HIGH (ref 0.44–1.00)
GFR, Est AFR Am: 57 mL/min — ABNORMAL LOW (ref >60)
GFR, Estimated: 49 mL/min — ABNORMAL LOW (ref >60)
Glucose, Bld: 130 mg/dL — ABNORMAL HIGH (ref 70–99)
Potassium: 3.8 mmol/L (ref 3.5–5.1)
Sodium: 142 mmol/L (ref 135–145)
Total Bilirubin: 0.4 mg/dL (ref 0.3–1.2)
Total Protein: 7.5 g/dL (ref 6.5–8.1)

## 2020-06-26 LAB — CBC WITH DIFFERENTIAL/PLATELET
Abs Immature Granulocytes: 0.02 10*3/uL (ref 0.00–0.07)
Basophils Absolute: 0.1 10*3/uL (ref 0.0–0.1)
Basophils Relative: 1 %
Eosinophils Absolute: 0.1 10*3/uL (ref 0.0–0.5)
Eosinophils Relative: 1 %
HCT: 37.8 % (ref 36.0–46.0)
Hemoglobin: 12.6 g/dL (ref 12.0–15.0)
Immature Granulocytes: 0 %
Lymphocytes Relative: 16 %
Lymphs Abs: 1.1 10*3/uL (ref 0.7–4.0)
MCH: 30.3 pg (ref 26.0–34.0)
MCHC: 33.3 g/dL (ref 30.0–36.0)
MCV: 90.9 fL (ref 80.0–100.0)
Monocytes Absolute: 0.6 10*3/uL (ref 0.1–1.0)
Monocytes Relative: 8 %
Neutro Abs: 5.3 10*3/uL (ref 1.7–7.7)
Neutrophils Relative %: 74 %
Platelets: 166 10*3/uL (ref 150–400)
RBC: 4.16 MIL/uL (ref 3.87–5.11)
RDW: 13.7 % (ref 11.5–15.5)
WBC: 7.2 10*3/uL (ref 4.0–10.5)
nRBC: 0 % (ref 0.0–0.2)

## 2020-06-26 MED ORDER — DENOSUMAB 120 MG/1.7ML ~~LOC~~ SOLN
120.0000 mg | Freq: Once | SUBCUTANEOUS | Status: AC
  Administered 2020-06-26: 120 mg via SUBCUTANEOUS

## 2020-06-26 MED ORDER — OCTREOTIDE ACETATE 30 MG IM KIT
PACK | INTRAMUSCULAR | Status: AC
  Filled 2020-06-26: qty 1

## 2020-06-26 MED ORDER — SODIUM CHLORIDE 0.9% FLUSH
10.0000 mL | Freq: Once | INTRAVENOUS | Status: AC
  Administered 2020-06-26: 10 mL
  Filled 2020-06-26: qty 10

## 2020-06-26 MED ORDER — OCTREOTIDE ACETATE 30 MG IM KIT
30.0000 mg | PACK | Freq: Once | INTRAMUSCULAR | Status: AC
  Administered 2020-06-26: 30 mg via INTRAMUSCULAR

## 2020-06-26 MED ORDER — DENOSUMAB 120 MG/1.7ML ~~LOC~~ SOLN
SUBCUTANEOUS | Status: AC
  Filled 2020-06-26: qty 1.7

## 2020-06-26 NOTE — Patient Instructions (Signed)
Octreotide injection solution What is this medicine? OCTREOTIDE (ok TREE oh tide) is used to reduce blood levels of growth hormone in patients with a condition called acromegaly. This medicine also reduces flushing and watery diarrhea caused by certain types of cancer. This medicine may be used for other purposes; ask your health care provider or pharmacist if you have questions. COMMON BRAND NAME(S): Bynfezia, Sandostatin What should I tell my health care provider before I take this medicine? They need to know if you have any of these conditions:  diabetes  gallbladder disease  kidney disease  liver disease  thyroid disease  an unusual or allergic reaction to octreotide, other medicines, foods, dyes, or preservatives  pregnant or trying to get pregnant  breast-feeding How should I use this medicine? This medicine is for injection under the skin or into a vein (only in emergency situations). It is usually given by a health care professional in a hospital or clinic setting. If you get this medicine at home, you will be taught how to prepare and give this medicine. Allow the injection solution to come to room temperature before use. Do not warm it artificially. Use exactly as directed. Take your medicine at regular intervals. Do not take your medicine more often than directed. It is important that you put your used needles and syringes in a special sharps container. Do not put them in a trash can. If you do not have a sharps container, call your pharmacist or healthcare provider to get one. Talk to your pediatrician regarding the use of this medicine in children. Special care may be needed. Overdosage: If you think you have taken too much of this medicine contact a poison control center or emergency room at once. NOTE: This medicine is only for you. Do not share this medicine with others. What if I miss a dose? If you miss a dose, take it as soon as you can. If it is almost time for your  next dose, take only that dose. Do not take double or extra doses. What may interact with this medicine?  bromocriptine  certain medicines for blood pressure, heart disease, irregular heartbeat  cyclosporine  diuretics  medicines for diabetes, including insulin  quinidine This list may not describe all possible interactions. Give your health care provider a list of all the medicines, herbs, non-prescription drugs, or dietary supplements you use. Also tell them if you smoke, drink alcohol, or use illegal drugs. Some items may interact with your medicine. What should I watch for while using this medicine? Visit your doctor or health care professional for regular checks on your progress. To help reduce irritation at the injection site, use a different site for each injection and make sure the solution is at room temperature before use. This medicine may cause decreases in blood sugar. Signs of low blood sugar include chills, cool, pale skin or cold sweats, drowsiness, extreme hunger, fast heartbeat, headache, nausea, nervousness or anxiety, shakiness, trembling, unsteadiness, tiredness, or weakness. Contact your doctor or health care professional right away if you experience any of these symptoms. This medicine may increase blood sugar. Ask your healthcare provider if changes in diet or medicines are needed if you have diabetes. This medicine may cause a decrease in vitamin B12. You should make sure that you get enough vitamin B12 while you are taking this medicine. Discuss the foods you eat and the vitamins you take with your health care professional. What side effects may I notice from receiving this medicine? Side   effects that you should report to your doctor or health care professional as soon as possible:  allergic reactions like skin rash, itching or hives, swelling of the face, lips, or tongue  fast, slow, or irregular heartbeat  right upper belly pain  severe stomach pain  signs  and symptoms of high blood sugar such as being more thirsty or hungry or having to urinate more than normal. You may also feel very tired or have blurry vision.  signs and symptoms of low blood sugar such as feeling anxious; confusion; dizziness; increased hunger; unusually weak or tired; increased sweating; shakiness; cold, clammy skin; irritable; headache; blurred vision; fast heartbeat; loss of consciousness  unusually weak or tired Side effects that usually do not require medical attention (report to your doctor or health care professional if they continue or are bothersome):  diarrhea  dizziness  gas  headache  nausea, vomiting  pain, redness, or irritation at site where injected  upset stomach This list may not describe all possible side effects. Call your doctor for medical advice about side effects. You may report side effects to FDA at 1-800-FDA-1088. Where should I keep my medicine? Keep out of the reach of children. Store in a refrigerator between 2 and 8 degrees C (36 and 46 degrees F). Protect from light. Allow to come to room temperature naturally. Do not use artificial heat. If protected from light, the injection may be stored at room temperature between 20 and 30 degrees C (70 and 86 degrees F) for 14 days. After the initial use, throw away any unused portion of a multiple dose vial after 14 days. Throw away unused portions of the ampules after use. NOTE: This sheet is a summary. It may not cover all possible information. If you have questions about this medicine, talk to your doctor, pharmacist, or health care provider.  2020 Elsevier/Gold Standard (2019-06-20 13:33:09) Denosumab injection What is this medicine? DENOSUMAB (den oh sue mab) slows bone breakdown. Prolia is used to treat osteoporosis in women after menopause and in men, and in people who are taking corticosteroids for 6 months or more. Delton See is used to treat a high calcium level due to cancer and to prevent  bone fractures and other bone problems caused by multiple myeloma or cancer bone metastases. Delton See is also used to treat giant cell tumor of the bone. This medicine may be used for other purposes; ask your health care provider or pharmacist if you have questions. COMMON BRAND NAME(S): Prolia, XGEVA What should I tell my health care provider before I take this medicine? They need to know if you have any of these conditions:  dental disease  having surgery or tooth extraction  infection  kidney disease  low levels of calcium or Vitamin D in the blood  malnutrition  on hemodialysis  skin conditions or sensitivity  thyroid or parathyroid disease  an unusual reaction to denosumab, other medicines, foods, dyes, or preservatives  pregnant or trying to get pregnant  breast-feeding How should I use this medicine? This medicine is for injection under the skin. It is given by a health care professional in a hospital or clinic setting. A special MedGuide will be given to you before each treatment. Be sure to read this information carefully each time. For Prolia, talk to your pediatrician regarding the use of this medicine in children. Special care may be needed. For Delton See, talk to your pediatrician regarding the use of this medicine in children. While this drug may be prescribed  for children as young as 13 years for selected conditions, precautions do apply. Overdosage: If you think you have taken too much of this medicine contact a poison control center or emergency room at once. NOTE: This medicine is only for you. Do not share this medicine with others. What if I miss a dose? It is important not to miss your dose. Call your doctor or health care professional if you are unable to keep an appointment. What may interact with this medicine? Do not take this medicine with any of the following medications:  other medicines containing denosumab This medicine may also interact with the  following medications:  medicines that lower your chance of fighting infection  steroid medicines like prednisone or cortisone This list may not describe all possible interactions. Give your health care provider a list of all the medicines, herbs, non-prescription drugs, or dietary supplements you use. Also tell them if you smoke, drink alcohol, or use illegal drugs. Some items may interact with your medicine. What should I watch for while using this medicine? Visit your doctor or health care professional for regular checks on your progress. Your doctor or health care professional may order blood tests and other tests to see how you are doing. Call your doctor or health care professional for advice if you get a fever, chills or sore throat, or other symptoms of a cold or flu. Do not treat yourself. This drug may decrease your body's ability to fight infection. Try to avoid being around people who are sick. You should make sure you get enough calcium and vitamin D while you are taking this medicine, unless your doctor tells you not to. Discuss the foods you eat and the vitamins you take with your health care professional. See your dentist regularly. Brush and floss your teeth as directed. Before you have any dental work done, tell your dentist you are receiving this medicine. Do not become pregnant while taking this medicine or for 5 months after stopping it. Talk with your doctor or health care professional about your birth control options while taking this medicine. Women should inform their doctor if they wish to become pregnant or think they might be pregnant. There is a potential for serious side effects to an unborn child. Talk to your health care professional or pharmacist for more information. What side effects may I notice from receiving this medicine? Side effects that you should report to your doctor or health care professional as soon as possible:  allergic reactions like skin rash, itching  or hives, swelling of the face, lips, or tongue  bone pain  breathing problems  dizziness  jaw pain, especially after dental work  redness, blistering, peeling of the skin  signs and symptoms of infection like fever or chills; cough; sore throat; pain or trouble passing urine  signs of low calcium like fast heartbeat, muscle cramps or muscle pain; pain, tingling, numbness in the hands or feet; seizures  unusual bleeding or bruising  unusually weak or tired Side effects that usually do not require medical attention (report to your doctor or health care professional if they continue or are bothersome):  constipation  diarrhea  headache  joint pain  loss of appetite  muscle pain  runny nose  tiredness  upset stomach This list may not describe all possible side effects. Call your doctor for medical advice about side effects. You may report side effects to FDA at 1-800-FDA-1088. Where should I keep my medicine? This medicine is only given  in a clinic, doctor's office, or other health care setting and will not be stored at home. NOTE: This sheet is a summary. It may not cover all possible information. If you have questions about this medicine, talk to your doctor, pharmacist, or health care provider.  2020 Elsevier/Gold Standard (2018-03-30 16:10:44)

## 2020-07-02 ENCOUNTER — Ambulatory Visit: Payer: Medicare Other

## 2020-07-03 ENCOUNTER — Other Ambulatory Visit: Payer: Medicare Other

## 2020-07-03 ENCOUNTER — Other Ambulatory Visit: Payer: Self-pay

## 2020-07-03 DIAGNOSIS — E559 Vitamin D deficiency, unspecified: Secondary | ICD-10-CM | POA: Diagnosis not present

## 2020-07-03 DIAGNOSIS — E785 Hyperlipidemia, unspecified: Secondary | ICD-10-CM | POA: Diagnosis not present

## 2020-07-04 LAB — LIPID PANEL
Chol/HDL Ratio: 3.8 ratio (ref 0.0–4.4)
Cholesterol, Total: 178 mg/dL (ref 100–199)
HDL: 47 mg/dL (ref >39)
LDL Chol Calc (NIH): 108 mg/dL — ABNORMAL HIGH (ref 0–99)
Triglycerides: 129 mg/dL (ref 0–149)
VLDL Cholesterol Cal: 23 mg/dL (ref 5–40)

## 2020-07-04 LAB — VITAMIN D 25 HYDROXY (VIT D DEFICIENCY, FRACTURES): Vit D, 25-Hydroxy: 48.8 ng/mL (ref 30.0–100.0)

## 2020-07-07 ENCOUNTER — Ambulatory Visit (INDEPENDENT_AMBULATORY_CARE_PROVIDER_SITE_OTHER): Payer: Medicare Other | Admitting: Physician Assistant

## 2020-07-07 ENCOUNTER — Encounter: Payer: Self-pay | Admitting: Physician Assistant

## 2020-07-07 ENCOUNTER — Other Ambulatory Visit: Payer: Self-pay

## 2020-07-07 VITALS — Ht 68.0 in | Wt 145.0 lb

## 2020-07-07 DIAGNOSIS — N189 Chronic kidney disease, unspecified: Secondary | ICD-10-CM

## 2020-07-07 DIAGNOSIS — F439 Reaction to severe stress, unspecified: Secondary | ICD-10-CM

## 2020-07-07 DIAGNOSIS — F32A Depression, unspecified: Secondary | ICD-10-CM

## 2020-07-07 DIAGNOSIS — F329 Major depressive disorder, single episode, unspecified: Secondary | ICD-10-CM

## 2020-07-07 DIAGNOSIS — E785 Hyperlipidemia, unspecified: Secondary | ICD-10-CM | POA: Diagnosis not present

## 2020-07-07 DIAGNOSIS — I10 Essential (primary) hypertension: Secondary | ICD-10-CM

## 2020-07-07 DIAGNOSIS — C3490 Malignant neoplasm of unspecified part of unspecified bronchus or lung: Secondary | ICD-10-CM

## 2020-07-07 DIAGNOSIS — E559 Vitamin D deficiency, unspecified: Secondary | ICD-10-CM

## 2020-07-07 NOTE — Progress Notes (Signed)
Telehealth office visit note for Cindy Reid, PA-C- at [facility] at [facility]   I connected with current patient today by telephone and verified that I am speaking with the correct person   . Location of the patient: Home . Location of the provider: Office - This visit type was conducted due to national recommendations for restrictions regarding the COVID-19 Pandemic (e.g. social distancing) in an effort to limit this patient's exposure and mitigate transmission in our community.    - No physical exam could be performed with this format, beyond that communicated to [place] by the patient/ family members as noted.   - Additionally my office staff/ schedulers were to discuss with the patient that there may be a monetary charge related to this service, depending on their medical insurance.  My understanding is that patient understood and consented to proceed.     _________________________________________________________________________________   History of Present Illness: Pt calls in for 4-month chronic follow-up on hypertension, mood, hyperlipidemia and CKD.  Patient would like to discuss increasing her Prozac dose.  Discussed at length she is currently going through increased personal stress.  States her current partner for many years has a girlfriend and is asking her to leave the house. Patient has a good support system.  Denies SI/HI.  HTN: Pt denies new onset chest pain, palpitations, dizziness or lower extremity swelling. Taking medication as directed without side effects.  Does not check blood pressure at home but feels like it has been fine.   HLD, diet controlled: Reports she has been eating more fried foods.    GAD 7 : Generalized Anxiety Score 12/03/2018  Nervous, Anxious, on Edge 2  Control/stop worrying 0  Worry too much - different things 0  Trouble relaxing 0  Restless 0  Easily annoyed or irritable 0  Afraid - awful might happen 3  Total GAD 7 Score 5   Anxiety Difficulty Not difficult at all    Depression screen [facility] 2/9 07/07/2020 12/04/2019 06/04/2019 12/03/2018 10/24/2018  Decreased Interest 0 1 0 2 3  Down, Depressed, Hopeless 0 - 1 1 2   PHQ - 2 Score 0 1 1 3 5   Altered sleeping 0 1 0 3 3  Tired, decreased energy 3 2 3 1 3   Change in appetite 0 1 0 0 3  Feeling bad or failure about yourself  0 1 0 1 1  Trouble concentrating 0 1 0 2 1  Moving slowly or fidgety/restless 0 1 0 3 1  Suicidal thoughts 0 0 0 0 0  PHQ-9 Score 3 8 4 13 17   Difficult doing work/chores Not difficult at all Somewhat difficult Somewhat difficult Somewhat difficult Very difficult  Some recent data might be hidden      Impression and Recommendations:     1. Depression, unspecified depression type   2. Metastatic lung cancer (metastasis from lung to other site), unspecified laterality ([place])   3. Hyperlipidemia, unspecified hyperlipidemia type   4. Essential hypertension   5. Chronic kidney disease, unspecified CKD stage   6. Vitamin D deficiency   7. Stress at home     Depression: -Patient is currently going through a difficult situation so will increase Prozac to 40 mg. -Recommended to incorporate nonpharmacological therapy such as exercise. -Continue to use good support system.  Patient prefers to wait on psychology referral. -Follow-up in 6 weeks to reassess symptoms and medication therapy.  Hypertension: -Stable -Continue current medication regimen. -Follow low-sodium diet.  Hyperlipidemia: -Most recent lipid panel: LDL mildly elevated at 108 -Recommend to reduce fried foods and increase physical activity such as walking. -Will continue to monitor and repeat in 6 months.  CKD: -Most recent CMP stable -Stay well hydrated, at least 64 fl oz -Avoid nephrotoxic substances such as NSAIDs.  Vitamin D deficiency: -Most recent vitamin D 48.8 -Continue vitamin D supplement  Metastatic lung cancer: -Continue to follow-up with oncologist.  -  As part of my medical decision making, I reviewed the following data within the [place] History obtained from pt /family, CMA notes reviewed and incorporated if applicable, Labs reviewed, Radiograph/ tests reviewed if applicable and OV notes from prior OV's with me, as well as any other specialists she/he has seen since seeing me last, were all reviewed and used in my medical decision making process today.    - Additionally, when appropriate, discussion had with patient regarding our treatment plan, and their biases/concerns about that plan were used in my medical decision making today.    - The patient agreed with the plan and demonstrated an understanding of the instructions.   No barriers to understanding were identified.     - The patient was advised to call back or seek an in-person evaluation if the symptoms worsen or if the condition fails to improve as anticipated.   Return in about 6 weeks (around 08/18/2020) for [employer] med.    No orders of the defined types were placed in this encounter.   No orders of the defined types were placed in this encounter.   There are no discontinued medications.     Time spent on visit including pre-visit chart review and post-visit care was 15 minutes.      The [place] was signed into law in 2016 which includes the topic of electronic health records.  This provides immediate access to information in MyChart.  This includes consultation notes, operative notes, office notes, lab results and pathology reports.  If you have any questions about what you read please let us know at your next visit or call us at the office.  We are right here with you.   __________________________________________________________________________________     Patient Care Team    Relationship Specialty Notifications Start End  Cindy Byrd, [place] PCP - General Physician Assistant  06/30/20   [name], MD Consulting  Physician Hematology and Oncology Admissions 06/24/15      -Vitals obtained; medications/ allergies reconciled;  personal medical, social, Sx etc.histories were updated by CMA, reviewed by me and are reflected in chart   Patient Active Problem List   Diagnosis Date Noted  . History of esophageal cancer   . Esophageal dysphagia   . Abnormal esophagram   . DOE (dyspnea on exertion) 04/15/2019  . Malignant neoplasm of distal third of esophagus ([place]) 01/14/2019  . Counseling regarding advance care planning and goals of care 01/14/2019  . Abnormal finding on imaging   . Esophageal mass   . GAD (generalized anxiety disorder) 10/24/2018  . Hyperlipidemia 04/23/2018  . Chronic kidney disease 04/23/2018  . Elevated TSH 04/17/2018  . Cough 02/21/2018  . Elevated serum creatinine 02/21/2018  . Health care maintenance 01/15/2018  . Malignant neoplasm of lung ([place])   . Colitis 06/23/2017  . Left lower quadrant pain 06/23/2017  . Fracture of left iliac crest ([place]) 06/23/2017  . Metastatic lung cancer (metastasis from lung to other site), unspecified laterality ([place])   . Hypocalcemia 09/08/2016  .  Vitamin D deficiency 09/08/2016  . New onset a-fib ([place]) 09/08/2016  . Chest pain   . Thrush of mouth and esophagus ([place])   . Protein-calorie malnutrition, severe ([place])   . Colitis determined by colorectal biopsy   . Bilateral pulmonary embolism ([place]) 09/07/2016  . DVT of lower extremity, bilateral ([place]) 09/07/2016  . Palliative care by specialist   . Goals of care, counseling/discussion   . Advance care planning   . Diarrhea   . Ulcerative pancolitis without complication ([place])   . Cancer ([place])   . Pressure injury of skin 09/02/2016  . HCAP (healthcare-associated pneumonia) 09/01/2016  . ARF (acute renal failure) ([place]) 09/01/2016  . Nausea with vomiting 07/18/2016  . Dehydration 07/18/2016  . Hypokalemia 07/18/2016  . Hypoalbuminemia due to protein-calorie malnutrition ([place]) 07/18/2016  .  Peripheral edema 07/18/2016  . Diarrhea due to drug 05/19/2016  . Port catheter in place 04/07/2016  . Nicotine addiction 01/19/2016  . Barrett's esophagus determined by biopsy 09/04/2015  . Gastritis   . Primary malignant neoplasm of lung metastatic to other site ([place]) 08/06/2015  . Esophageal reflux 08/04/2015  . Bone metastasis ([place]) 06/24/2015  . Neoplasm related pain 06/24/2015  . Protein calorie malnutrition ([place]) 06/24/2015  . Anorexia 06/24/2015  . Heavy smoker (more than 20 cigarettes per day) 06/24/2015  . Depression 06/24/2015  . HTN (hypertension) 06/24/2015     Current Meds  Medication Sig  . amLODipine (NORVASC) 5 MG tablet TAKE 1 TABLET BY MOUTH EVERY DAY  . Biotin (BIOTIN 5000) 5 MG CAPS Take 5 mg by mouth daily.  . Calcium Citrate-Vitamin D (CALCIUM + D PO) Take 1 tablet by mouth daily.  . Cyanocobalamin (B-12) 2500 MCG TABS Take 2,500 mcg by mouth daily.  [name] FLUoxetine (PROZAC) 20 MG capsule TAKE 1 CAPSULE BY MOUTH EVERY DAY  . omeprazole (PRILOSEC) 40 MG capsule TAKE 1 CAPSULE BY MOUTH EVERY DAY (Patient taking differently: Take 40 mg by mouth daily. )  . traZODone (DESYREL) 50 MG tablet TAKE 1 TABLET BY MOUTH EVERYDAY AT BEDTIME  . Vitamin D, Ergocalciferol, (DRISDOL) 1.25 MG (50000 UNIT) CAPS capsule TAKE 1 CAPSULE BY MOUTH EVERY 7 DAYS  . XARELTO 20 MG TABS tablet TAKE 1 TABLET (20 MG TOTAL) BY MOUTH DAILY WITH SUPPER.     Allergies:  Allergies  Allergen Reactions  . Penicillins Other (See Comments)    Unknown; childhood allergy Did it involve swelling of the face/tongue/throat, SOB, or low BP? Unknown Did it involve sudden or severe rash/hives, skin peeling, or any reaction on the inside of your mouth or nose? Unknown Did you need to seek medical attention at a hospital or doctor's office? Unknown When did it last happen?childhood allergy If all above answers are "NO", may proceed with cephalosporin use.   . Remeron [Mirtazapine] Other (See  Comments)    nightmares  . Latex Rash     ROS:  See above HPI for pertinent positives and negatives   Objective:   Height 5\' 8"  (1.727 m), weight 145 lb (65.8 kg).  (if some vitals are omitted, this means that patient was UNABLE to obtain them even though they were asked to get them prior to OV today.  They were asked to call us at their earliest convenience with these once obtained. ) General: A & O * 3; sounds in no acute distress; in usual state of health.  Respiratory: speaking in full sentences, no conversational dyspnea;  Psych: insight appears good, mood- appears stable

## 2020-07-08 ENCOUNTER — Telehealth: Payer: Self-pay | Admitting: Physician Assistant

## 2020-07-08 ENCOUNTER — Other Ambulatory Visit: Payer: Self-pay | Admitting: Hematology

## 2020-07-08 DIAGNOSIS — I10 Essential (primary) hypertension: Secondary | ICD-10-CM

## 2020-07-08 MED ORDER — FLUOXETINE HCL 40 MG PO CAPS
40.0000 mg | ORAL_CAPSULE | Freq: Every day | ORAL | 0 refills | Status: DC
Start: 2020-07-08 — End: 2020-09-29

## 2020-07-08 NOTE — Addendum Note (Signed)
Addended by: Mickel Crow on: 07/08/2020 01:27 PM   Modules accepted: Orders

## 2020-07-08 NOTE — Telephone Encounter (Signed)
Patient is inquiring about her Prozac prescription, she states there was supposed to be a new prescription sent to [facility] on [address] for her Prozac and that it was to be increased from 20mg  to 40mg . Please advise

## 2020-07-08 NOTE — Telephone Encounter (Signed)
Prozac 40mg  sent to requested pharmacy per [facility]. AS, CMA

## 2020-07-15 ENCOUNTER — Ambulatory Visit (HOSPITAL_COMMUNITY)
Admission: RE | Admit: 2020-07-15 | Discharge: 2020-07-15 | Disposition: A | Discharge status: Home / Self Care | Payer: Medicare Other | Source: Ambulatory Visit | Attending: Hematology | Admitting: Hematology

## 2020-07-15 ENCOUNTER — Encounter (HOSPITAL_COMMUNITY): Payer: Self-pay

## 2020-07-15 ENCOUNTER — Other Ambulatory Visit: Payer: Self-pay

## 2020-07-15 DIAGNOSIS — C155 Malignant neoplasm of lower third of esophagus: Secondary | ICD-10-CM | POA: Diagnosis not present

## 2020-07-15 DIAGNOSIS — C3490 Malignant neoplasm of unspecified part of unspecified bronchus or lung: Secondary | ICD-10-CM | POA: Diagnosis not present

## 2020-07-15 DIAGNOSIS — C7951 Secondary malignant neoplasm of bone: Secondary | ICD-10-CM | POA: Diagnosis not present

## 2020-07-15 MED ORDER — SODIUM CHLORIDE (PF) 0.9 % IJ SOLN
INTRAMUSCULAR | Status: AC
  Filled 2020-07-15: qty 50

## 2020-07-15 MED ORDER — IOHEXOL 300 MG/ML  SOLN
100.0000 mL | Freq: Once | INTRAMUSCULAR | Status: AC | PRN
  Administered 2020-07-15: 100 mL via INTRAVENOUS

## 2020-07-24 ENCOUNTER — Inpatient Hospital Stay: Payer: Medicare Other | Attending: Hematology

## 2020-07-24 ENCOUNTER — Other Ambulatory Visit: Payer: Self-pay

## 2020-07-24 ENCOUNTER — Other Ambulatory Visit: Payer: Self-pay | Admitting: *Deleted

## 2020-07-24 ENCOUNTER — Inpatient Hospital Stay: Payer: Medicare Other

## 2020-07-24 VITALS — BP 158/64 | HR 81 | Resp 18

## 2020-07-24 DIAGNOSIS — D500 Iron deficiency anemia secondary to blood loss (chronic): Secondary | ICD-10-CM

## 2020-07-24 DIAGNOSIS — C155 Malignant neoplasm of lower third of esophagus: Secondary | ICD-10-CM

## 2020-07-24 DIAGNOSIS — Z95828 Presence of other vascular implants and grafts: Secondary | ICD-10-CM

## 2020-07-24 DIAGNOSIS — C9000 Multiple myeloma not having achieved remission: Secondary | ICD-10-CM

## 2020-07-24 DIAGNOSIS — C3490 Malignant neoplasm of unspecified part of unspecified bronchus or lung: Secondary | ICD-10-CM | POA: Diagnosis not present

## 2020-07-24 DIAGNOSIS — K521 Toxic gastroenteritis and colitis: Secondary | ICD-10-CM

## 2020-07-24 DIAGNOSIS — C7951 Secondary malignant neoplasm of bone: Secondary | ICD-10-CM | POA: Diagnosis not present

## 2020-07-24 DIAGNOSIS — K529 Noninfective gastroenteritis and colitis, unspecified: Secondary | ICD-10-CM

## 2020-07-24 DIAGNOSIS — Z7189 Other specified counseling: Secondary | ICD-10-CM

## 2020-07-24 DIAGNOSIS — R197 Diarrhea, unspecified: Secondary | ICD-10-CM

## 2020-07-24 LAB — CMP (CANCER CENTER ONLY)
ALT: 10 U/L (ref 0–44)
AST: 18 U/L (ref 15–41)
Albumin: 3.5 g/dL (ref 3.5–5.0)
Alkaline Phosphatase: 81 U/L (ref 38–126)
Anion gap: 8 (ref 5–15)
BUN: 16 mg/dL (ref 8–23)
CO2: 25 mmol/L (ref 22–32)
Calcium: 9.8 mg/dL (ref 8.9–10.3)
Chloride: 106 mmol/L (ref 98–111)
Creatinine: 1.05 mg/dL — ABNORMAL HIGH (ref 0.44–1.00)
GFR, Est AFR Am: 58 mL/min — ABNORMAL LOW (ref >60)
GFR, Estimated: 50 mL/min — ABNORMAL LOW (ref >60)
Glucose, Bld: 123 mg/dL — ABNORMAL HIGH (ref 70–99)
Potassium: 4.7 mmol/L (ref 3.5–5.1)
Sodium: 139 mmol/L (ref 135–145)
Total Bilirubin: 0.4 mg/dL (ref 0.3–1.2)
Total Protein: 7.6 g/dL (ref 6.5–8.1)

## 2020-07-24 LAB — CBC WITH DIFFERENTIAL (CANCER CENTER ONLY)
Abs Immature Granulocytes: 0.03 10*3/uL (ref 0.00–0.07)
Basophils Absolute: 0.1 10*3/uL (ref 0.0–0.1)
Basophils Relative: 1 %
Eosinophils Absolute: 0.1 10*3/uL (ref 0.0–0.5)
Eosinophils Relative: 2 %
HCT: 38.3 % (ref 36.0–46.0)
Hemoglobin: 12.4 g/dL (ref 12.0–15.0)
Immature Granulocytes: 0 %
Lymphocytes Relative: 20 %
Lymphs Abs: 1.4 10*3/uL (ref 0.7–4.0)
MCH: 30 pg (ref 26.0–34.0)
MCHC: 32.4 g/dL (ref 30.0–36.0)
MCV: 92.5 fL (ref 80.0–100.0)
Monocytes Absolute: 0.6 10*3/uL (ref 0.1–1.0)
Monocytes Relative: 9 %
Neutro Abs: 4.8 10*3/uL (ref 1.7–7.7)
Neutrophils Relative %: 68 %
Platelet Count: 182 10*3/uL (ref 150–400)
RBC: 4.14 MIL/uL (ref 3.87–5.11)
RDW: 13.6 % (ref 11.5–15.5)
WBC Count: 7 10*3/uL (ref 4.0–10.5)
nRBC: 0 % (ref 0.0–0.2)

## 2020-07-24 LAB — FERRITIN: Ferritin: 118 ng/mL (ref 11–307)

## 2020-07-24 MED ORDER — OCTREOTIDE ACETATE 30 MG IM KIT
PACK | INTRAMUSCULAR | Status: AC
  Filled 2020-07-24: qty 1

## 2020-07-24 MED ORDER — OCTREOTIDE ACETATE 30 MG IM KIT
30.0000 mg | PACK | Freq: Once | INTRAMUSCULAR | Status: AC
  Administered 2020-07-24: 30 mg via INTRAMUSCULAR

## 2020-07-24 MED ORDER — SODIUM CHLORIDE 0.9% FLUSH
10.0000 mL | Freq: Once | INTRAVENOUS | Status: DC
  Filled 2020-07-24: qty 10

## 2020-07-24 MED ORDER — HEPARIN SOD (PORK) LOCK FLUSH 100 UNIT/ML IV SOLN
250.0000 [IU] | Freq: Once | INTRAVENOUS | Status: DC
  Filled 2020-07-24: qty 5

## 2020-07-24 NOTE — Patient Instructions (Signed)

## 2020-07-24 NOTE — Patient Instructions (Signed)
Octreotide injection solution What is this medicine? OCTREOTIDE (ok TREE oh tide) is used to reduce blood levels of growth hormone in patients with a condition called acromegaly. This medicine also reduces flushing and watery diarrhea caused by certain types of cancer. This medicine may be used for other purposes; ask your health care provider or pharmacist if you have questions. COMMON BRAND NAME(S): Bynfezia, Sandostatin What should I tell my health care provider before I take this medicine? They need to know if you have any of these conditions:  diabetes  gallbladder disease  kidney disease  liver disease  thyroid disease  an unusual or allergic reaction to octreotide, other medicines, foods, dyes, or preservatives  pregnant or trying to get pregnant  breast-feeding How should I use this medicine? This medicine is for injection under the skin or into a vein (only in emergency situations). It is usually given by a health care professional in a hospital or clinic setting. If you get this medicine at home, you will be taught how to prepare and give this medicine. Allow the injection solution to come to room temperature before use. Do not warm it artificially. Use exactly as directed. Take your medicine at regular intervals. Do not take your medicine more often than directed. It is important that you put your used needles and syringes in a special sharps container. Do not put them in a trash can. If you do not have a sharps container, call your pharmacist or healthcare provider to get one. Talk to your pediatrician regarding the use of this medicine in children. Special care may be needed. Overdosage: If you think you have taken too much of this medicine contact a poison control center or emergency room at once. NOTE: This medicine is only for you. Do not share this medicine with others. What if I miss a dose? If you miss a dose, take it as soon as you can. If it is almost time for your  next dose, take only that dose. Do not take double or extra doses. What may interact with this medicine?  bromocriptine  certain medicines for blood pressure, heart disease, irregular heartbeat  cyclosporine  diuretics  medicines for diabetes, including insulin  quinidine This list may not describe all possible interactions. Give your health care provider a list of all the medicines, herbs, non-prescription drugs, or dietary supplements you use. Also tell them if you smoke, drink alcohol, or use illegal drugs. Some items may interact with your medicine. What should I watch for while using this medicine? Visit your doctor or health care professional for regular checks on your progress. To help reduce irritation at the injection site, use a different site for each injection and make sure the solution is at room temperature before use. This medicine may cause decreases in blood sugar. Signs of low blood sugar include chills, cool, pale skin or cold sweats, drowsiness, extreme hunger, fast heartbeat, headache, nausea, nervousness or anxiety, shakiness, trembling, unsteadiness, tiredness, or weakness. Contact your doctor or health care professional right away if you experience any of these symptoms. This medicine may increase blood sugar. Ask your healthcare provider if changes in diet or medicines are needed if you have diabetes. This medicine may cause a decrease in vitamin B12. You should make sure that you get enough vitamin B12 while you are taking this medicine. Discuss the foods you eat and the vitamins you take with your health care professional. What side effects may I notice from receiving this medicine? Side   effects that you should report to your doctor or health care professional as soon as possible:  allergic reactions like skin rash, itching or hives, swelling of the face, lips, or tongue  fast, slow, or irregular heartbeat  right upper belly pain  severe stomach pain  signs  and symptoms of high blood sugar such as being more thirsty or hungry or having to urinate more than normal. You may also feel very tired or have blurry vision.  signs and symptoms of low blood sugar such as feeling anxious; confusion; dizziness; increased hunger; unusually weak or tired; increased sweating; shakiness; cold, clammy skin; irritable; headache; blurred vision; fast heartbeat; loss of consciousness  unusually weak or tired Side effects that usually do not require medical attention (report to your doctor or health care professional if they continue or are bothersome):  diarrhea  dizziness  gas  headache  nausea, vomiting  pain, redness, or irritation at site where injected  upset stomach This list may not describe all possible side effects. Call your doctor for medical advice about side effects. You may report side effects to FDA at 1-800-FDA-1088. Where should I keep my medicine? Keep out of the reach of children. Store in a refrigerator between 2 and 8 degrees C (36 and 46 degrees F). Protect from light. Allow to come to room temperature naturally. Do not use artificial heat. If protected from light, the injection may be stored at room temperature between 20 and 30 degrees C (70 and 86 degrees F) for 14 days. After the initial use, throw away any unused portion of a multiple dose vial after 14 days. Throw away unused portions of the ampules after use. NOTE: This sheet is a summary. It may not cover all possible information. If you have questions about this medicine, talk to your doctor, pharmacist, or health care provider.  2020 Elsevier/Gold Standard (2019-06-20 13:33:09)  

## 2020-07-24 NOTE — Progress Notes (Signed)
Drew labs peripherally.

## 2020-07-25 ENCOUNTER — Other Ambulatory Visit: Payer: Self-pay | Admitting: Adult Health

## 2020-07-30 ENCOUNTER — Ambulatory Visit: Payer: Medicare Other

## 2020-08-12 ENCOUNTER — Other Ambulatory Visit: Payer: Self-pay | Admitting: Hematology

## 2020-08-12 DIAGNOSIS — I10 Essential (primary) hypertension: Secondary | ICD-10-CM

## 2020-08-12 NOTE — Telephone Encounter (Signed)
Please review for refill.  

## 2020-08-13 NOTE — Telephone Encounter (Signed)
Will refill this last time-- she should really be following with PCP for HTN management.

## 2020-08-17 ENCOUNTER — Other Ambulatory Visit: Payer: Self-pay | Admitting: Adult Health

## 2020-08-18 ENCOUNTER — Other Ambulatory Visit: Payer: Self-pay | Admitting: Hematology

## 2020-08-21 ENCOUNTER — Encounter: Payer: Self-pay | Admitting: General Practice

## 2020-08-21 ENCOUNTER — Other Ambulatory Visit: Payer: Self-pay

## 2020-08-21 ENCOUNTER — Inpatient Hospital Stay (HOSPITAL_BASED_OUTPATIENT_CLINIC_OR_DEPARTMENT_OTHER): Payer: Medicare Other | Admitting: Hematology

## 2020-08-21 ENCOUNTER — Inpatient Hospital Stay: Payer: Medicare Other | Attending: Hematology

## 2020-08-21 ENCOUNTER — Inpatient Hospital Stay: Payer: Medicare Other

## 2020-08-21 VITALS — BP 135/65 | HR 72 | Temp 97.6°F | Resp 18 | Ht 68.0 in | Wt 130.2 lb

## 2020-08-21 DIAGNOSIS — C155 Malignant neoplasm of lower third of esophagus: Secondary | ICD-10-CM | POA: Diagnosis not present

## 2020-08-21 DIAGNOSIS — R197 Diarrhea, unspecified: Secondary | ICD-10-CM

## 2020-08-21 DIAGNOSIS — C3490 Malignant neoplasm of unspecified part of unspecified bronchus or lung: Secondary | ICD-10-CM

## 2020-08-21 DIAGNOSIS — Z803 Family history of malignant neoplasm of breast: Secondary | ICD-10-CM | POA: Insufficient documentation

## 2020-08-21 DIAGNOSIS — Z90710 Acquired absence of both cervix and uterus: Secondary | ICD-10-CM | POA: Diagnosis not present

## 2020-08-21 DIAGNOSIS — Z79899 Other long term (current) drug therapy: Secondary | ICD-10-CM | POA: Insufficient documentation

## 2020-08-21 DIAGNOSIS — K521 Toxic gastroenteritis and colitis: Secondary | ICD-10-CM | POA: Insufficient documentation

## 2020-08-21 DIAGNOSIS — Z800 Family history of malignant neoplasm of digestive organs: Secondary | ICD-10-CM | POA: Diagnosis not present

## 2020-08-21 DIAGNOSIS — Z7189 Other specified counseling: Secondary | ICD-10-CM

## 2020-08-21 DIAGNOSIS — C7951 Secondary malignant neoplasm of bone: Secondary | ICD-10-CM | POA: Diagnosis not present

## 2020-08-21 DIAGNOSIS — K529 Noninfective gastroenteritis and colitis, unspecified: Secondary | ICD-10-CM

## 2020-08-21 DIAGNOSIS — Z87891 Personal history of nicotine dependence: Secondary | ICD-10-CM | POA: Diagnosis not present

## 2020-08-21 DIAGNOSIS — Z95828 Presence of other vascular implants and grafts: Secondary | ICD-10-CM

## 2020-08-21 DIAGNOSIS — F329 Major depressive disorder, single episode, unspecified: Secondary | ICD-10-CM | POA: Insufficient documentation

## 2020-08-21 DIAGNOSIS — I10 Essential (primary) hypertension: Secondary | ICD-10-CM | POA: Insufficient documentation

## 2020-08-21 DIAGNOSIS — D500 Iron deficiency anemia secondary to blood loss (chronic): Secondary | ICD-10-CM

## 2020-08-21 LAB — CMP (CANCER CENTER ONLY)
ALT: 9 U/L (ref 0–44)
AST: 21 U/L (ref 15–41)
Albumin: 3.3 g/dL — ABNORMAL LOW (ref 3.5–5.0)
Alkaline Phosphatase: 60 U/L (ref 38–126)
Anion gap: 9 (ref 5–15)
BUN: 23 mg/dL (ref 8–23)
CO2: 21 mmol/L — ABNORMAL LOW (ref 22–32)
Calcium: 9.4 mg/dL (ref 8.9–10.3)
Chloride: 108 mmol/L (ref 98–111)
Creatinine: 1.1 mg/dL — ABNORMAL HIGH (ref 0.44–1.00)
GFR, Est AFR Am: 55 mL/min — ABNORMAL LOW (ref >60)
GFR, Estimated: 47 mL/min — ABNORMAL LOW (ref >60)
Glucose, Bld: 104 mg/dL — ABNORMAL HIGH (ref 70–99)
Potassium: 4.1 mmol/L (ref 3.5–5.1)
Sodium: 138 mmol/L (ref 135–145)
Total Bilirubin: 0.4 mg/dL (ref 0.3–1.2)
Total Protein: 7.8 g/dL (ref 6.5–8.1)

## 2020-08-21 MED ORDER — OMEPRAZOLE 40 MG PO CPDR
40.0000 mg | DELAYED_RELEASE_CAPSULE | Freq: Every day | ORAL | 3 refills | Status: DC
Start: 2020-08-21 — End: 2020-11-13

## 2020-08-21 MED ORDER — HEPARIN SOD (PORK) LOCK FLUSH 100 UNIT/ML IV SOLN
250.0000 [IU] | Freq: Once | INTRAVENOUS | Status: AC
  Administered 2020-08-21: 500 [IU]
  Filled 2020-08-21: qty 5

## 2020-08-21 MED ORDER — OCTREOTIDE ACETATE 30 MG IM KIT
PACK | INTRAMUSCULAR | Status: AC
  Filled 2020-08-21: qty 1

## 2020-08-21 MED ORDER — OCTREOTIDE ACETATE 30 MG IM KIT
30.0000 mg | PACK | Freq: Once | INTRAMUSCULAR | Status: AC
  Administered 2020-08-21: 30 mg via INTRAMUSCULAR

## 2020-08-21 MED ORDER — SODIUM CHLORIDE 0.9% FLUSH
10.0000 mL | Freq: Once | INTRAVENOUS | Status: AC
  Administered 2020-08-21: 10 mL
  Filled 2020-08-21: qty 10

## 2020-08-21 NOTE — Patient Instructions (Signed)
Octreotide injection solution What is this medicine? OCTREOTIDE (ok TREE oh tide) is used to reduce blood levels of growth hormone in patients with a condition called acromegaly. This medicine also reduces flushing and watery diarrhea caused by certain types of cancer. This medicine may be used for other purposes; ask your health care provider or pharmacist if you have questions. COMMON BRAND NAME(S): Bynfezia, Sandostatin What should I tell my health care provider before I take this medicine? They need to know if you have any of these conditions:  diabetes  gallbladder disease  kidney disease  liver disease  thyroid disease  an unusual or allergic reaction to octreotide, other medicines, foods, dyes, or preservatives  pregnant or trying to get pregnant  breast-feeding How should I use this medicine? This medicine is for injection under the skin or into a vein (only in emergency situations). It is usually given by a health care professional in a hospital or clinic setting. If you get this medicine at home, you will be taught how to prepare and give this medicine. Allow the injection solution to come to room temperature before use. Do not warm it artificially. Use exactly as directed. Take your medicine at regular intervals. Do not take your medicine more often than directed. It is important that you put your used needles and syringes in a special sharps container. Do not put them in a trash can. If you do not have a sharps container, call your pharmacist or healthcare provider to get one. Talk to your pediatrician regarding the use of this medicine in children. Special care may be needed. Overdosage: If you think you have taken too much of this medicine contact a poison control center or emergency room at once. NOTE: This medicine is only for you. Do not share this medicine with others. What if I miss a dose? If you miss a dose, take it as soon as you can. If it is almost time for your  next dose, take only that dose. Do not take double or extra doses. What may interact with this medicine?  bromocriptine  certain medicines for blood pressure, heart disease, irregular heartbeat  cyclosporine  diuretics  medicines for diabetes, including insulin  quinidine This list may not describe all possible interactions. Give your health care provider a list of all the medicines, herbs, non-prescription drugs, or dietary supplements you use. Also tell them if you smoke, drink alcohol, or use illegal drugs. Some items may interact with your medicine. What should I watch for while using this medicine? Visit your doctor or health care professional for regular checks on your progress. To help reduce irritation at the injection site, use a different site for each injection and make sure the solution is at room temperature before use. This medicine may cause decreases in blood sugar. Signs of low blood sugar include chills, cool, pale skin or cold sweats, drowsiness, extreme hunger, fast heartbeat, headache, nausea, nervousness or anxiety, shakiness, trembling, unsteadiness, tiredness, or weakness. Contact your doctor or health care professional right away if you experience any of these symptoms. This medicine may increase blood sugar. Ask your healthcare provider if changes in diet or medicines are needed if you have diabetes. This medicine may cause a decrease in vitamin B12. You should make sure that you get enough vitamin B12 while you are taking this medicine. Discuss the foods you eat and the vitamins you take with your health care professional. What side effects may I notice from receiving this medicine? Side   effects that you should report to your doctor or health care professional as soon as possible:  allergic reactions like skin rash, itching or hives, swelling of the face, lips, or tongue  fast, slow, or irregular heartbeat  right upper belly pain  severe stomach pain  signs  and symptoms of high blood sugar such as being more thirsty or hungry or having to urinate more than normal. You may also feel very tired or have blurry vision.  signs and symptoms of low blood sugar such as feeling anxious; confusion; dizziness; increased hunger; unusually weak or tired; increased sweating; shakiness; cold, clammy skin; irritable; headache; blurred vision; fast heartbeat; loss of consciousness  unusually weak or tired Side effects that usually do not require medical attention (report to your doctor or health care professional if they continue or are bothersome):  diarrhea  dizziness  gas  headache  nausea, vomiting  pain, redness, or irritation at site where injected  upset stomach This list may not describe all possible side effects. Call your doctor for medical advice about side effects. You may report side effects to FDA at 1-800-FDA-1088. Where should I keep my medicine? Keep out of the reach of children. Store in a refrigerator between 2 and 8 degrees C (36 and 46 degrees F). Protect from light. Allow to come to room temperature naturally. Do not use artificial heat. If protected from light, the injection may be stored at room temperature between 20 and 30 degrees C (70 and 86 degrees F) for 14 days. After the initial use, throw away any unused portion of a multiple dose vial after 14 days. Throw away unused portions of the ampules after use. NOTE: This sheet is a summary. It may not cover all possible information. If you have questions about this medicine, talk to your doctor, pharmacist, or health care provider.  2020 Elsevier/Gold Standard (2019-06-20 13:33:09)  

## 2020-08-21 NOTE — Patient Instructions (Signed)
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
## 2020-08-27 ENCOUNTER — Ambulatory Visit: Payer: Medicare Other | Admitting: Hematology

## 2020-08-27 ENCOUNTER — Other Ambulatory Visit: Payer: Medicare Other

## 2020-08-28 ENCOUNTER — Telehealth: Payer: Self-pay | Admitting: Hematology

## 2020-08-28 NOTE — Telephone Encounter (Signed)
Scheduled per 09/17 los, patient has been called and notified.

## 2020-09-06 ENCOUNTER — Other Ambulatory Visit: Payer: Self-pay | Admitting: Hematology

## 2020-09-08 ENCOUNTER — Other Ambulatory Visit: Payer: Self-pay | Admitting: Hematology

## 2020-09-08 DIAGNOSIS — I10 Essential (primary) hypertension: Secondary | ICD-10-CM

## 2020-09-18 ENCOUNTER — Inpatient Hospital Stay: Payer: Medicare Other

## 2020-09-18 ENCOUNTER — Other Ambulatory Visit: Payer: Self-pay

## 2020-09-18 ENCOUNTER — Inpatient Hospital Stay: Payer: Medicare Other | Attending: Hematology

## 2020-09-18 VITALS — BP 151/67 | HR 65 | Resp 18

## 2020-09-18 DIAGNOSIS — C7951 Secondary malignant neoplasm of bone: Secondary | ICD-10-CM | POA: Diagnosis not present

## 2020-09-18 DIAGNOSIS — K521 Toxic gastroenteritis and colitis: Secondary | ICD-10-CM

## 2020-09-18 DIAGNOSIS — C3490 Malignant neoplasm of unspecified part of unspecified bronchus or lung: Secondary | ICD-10-CM

## 2020-09-18 DIAGNOSIS — R197 Diarrhea, unspecified: Secondary | ICD-10-CM

## 2020-09-18 DIAGNOSIS — C155 Malignant neoplasm of lower third of esophagus: Secondary | ICD-10-CM

## 2020-09-18 DIAGNOSIS — C3491 Malignant neoplasm of unspecified part of right bronchus or lung: Secondary | ICD-10-CM

## 2020-09-18 DIAGNOSIS — Z7189 Other specified counseling: Secondary | ICD-10-CM

## 2020-09-18 DIAGNOSIS — K529 Noninfective gastroenteritis and colitis, unspecified: Secondary | ICD-10-CM

## 2020-09-18 DIAGNOSIS — D500 Iron deficiency anemia secondary to blood loss (chronic): Secondary | ICD-10-CM

## 2020-09-18 DIAGNOSIS — Z95828 Presence of other vascular implants and grafts: Secondary | ICD-10-CM

## 2020-09-18 LAB — CMP (CANCER CENTER ONLY)
ALT: 8 U/L (ref 0–44)
AST: 18 U/L (ref 15–41)
Albumin: 3.2 g/dL — ABNORMAL LOW (ref 3.5–5.0)
Alkaline Phosphatase: 69 U/L (ref 38–126)
Anion gap: 4 — ABNORMAL LOW (ref 5–15)
BUN: 17 mg/dL (ref 8–23)
CO2: 28 mmol/L (ref 22–32)
Calcium: 9.2 mg/dL (ref 8.9–10.3)
Chloride: 104 mmol/L (ref 98–111)
Creatinine: 1.07 mg/dL — ABNORMAL HIGH (ref 0.44–1.00)
GFR, Estimated: 49 mL/min — ABNORMAL LOW (ref >60)
Glucose, Bld: 105 mg/dL — ABNORMAL HIGH (ref 70–99)
Potassium: 4.6 mmol/L (ref 3.5–5.1)
Sodium: 136 mmol/L (ref 135–145)
Total Bilirubin: 0.4 mg/dL (ref 0.3–1.2)
Total Protein: 7.5 g/dL (ref 6.5–8.1)

## 2020-09-18 MED ORDER — HEPARIN SOD (PORK) LOCK FLUSH 100 UNIT/ML IV SOLN
250.0000 [IU] | Freq: Once | INTRAVENOUS | Status: AC
  Administered 2020-09-18: 500 [IU]
  Filled 2020-09-18: qty 5

## 2020-09-18 MED ORDER — DENOSUMAB 120 MG/1.7ML ~~LOC~~ SOLN
120.0000 mg | Freq: Once | SUBCUTANEOUS | Status: AC
  Administered 2020-09-18: 120 mg via SUBCUTANEOUS

## 2020-09-18 MED ORDER — OCTREOTIDE ACETATE 30 MG IM KIT
30.0000 mg | PACK | Freq: Once | INTRAMUSCULAR | Status: AC
  Administered 2020-09-18: 30 mg via INTRAMUSCULAR

## 2020-09-18 MED ORDER — OCTREOTIDE ACETATE 30 MG IM KIT
PACK | INTRAMUSCULAR | Status: AC
  Filled 2020-09-18: qty 1

## 2020-09-18 MED ORDER — DENOSUMAB 120 MG/1.7ML ~~LOC~~ SOLN
SUBCUTANEOUS | Status: AC
  Filled 2020-09-18: qty 1.7

## 2020-09-18 MED ORDER — SODIUM CHLORIDE 0.9% FLUSH
10.0000 mL | Freq: Once | INTRAVENOUS | Status: AC
  Administered 2020-09-18: 10 mL
  Filled 2020-09-18: qty 10

## 2020-09-18 NOTE — Patient Instructions (Signed)
Denosumab injection What is this medicine? DENOSUMAB (den oh sue mab) slows bone breakdown. Prolia is used to treat osteoporosis in women after menopause and in men, and in people who are taking corticosteroids for 6 months or more. Xgeva is used to treat a high calcium level due to cancer and to prevent bone fractures and other bone problems caused by multiple myeloma or cancer bone metastases. Xgeva is also used to treat giant cell tumor of the bone. This medicine may be used for other purposes; ask your health care provider or pharmacist if you have questions. COMMON BRAND NAME(S): Prolia, XGEVA What should I tell my health care provider before I take this medicine? They need to know if you have any of these conditions:  dental disease  having surgery or tooth extraction  infection  kidney disease  low levels of calcium or Vitamin D in the blood  malnutrition  on hemodialysis  skin conditions or sensitivity  thyroid or parathyroid disease  an unusual reaction to denosumab, other medicines, foods, dyes, or preservatives  pregnant or trying to get pregnant  breast-feeding How should I use this medicine? This medicine is for injection under the skin. It is given by a health care professional in a hospital or clinic setting. A special MedGuide will be given to you before each treatment. Be sure to read this information carefully each time. For Prolia, talk to your pediatrician regarding the use of this medicine in children. Special care may be needed. For Xgeva, talk to your pediatrician regarding the use of this medicine in children. While this drug may be prescribed for children as young as 13 years for selected conditions, precautions do apply. Overdosage: If you think you have taken too much of this medicine contact a poison control center or emergency room at once. NOTE: This medicine is only for you. Do not share this medicine with others. What if I miss a dose? It is  important not to miss your dose. Call your doctor or health care professional if you are unable to keep an appointment. What may interact with this medicine? Do not take this medicine with any of the following medications:  other medicines containing denosumab This medicine may also interact with the following medications:  medicines that lower your chance of fighting infection  steroid medicines like prednisone or cortisone This list may not describe all possible interactions. Give your health care provider a list of all the medicines, herbs, non-prescription drugs, or dietary supplements you use. Also tell them if you smoke, drink alcohol, or use illegal drugs. Some items may interact with your medicine. What should I watch for while using this medicine? Visit your doctor or health care professional for regular checks on your progress. Your doctor or health care professional may order blood tests and other tests to see how you are doing. Call your doctor or health care professional for advice if you get a fever, chills or sore throat, or other symptoms of a cold or flu. Do not treat yourself. This drug may decrease your body's ability to fight infection. Try to avoid being around people who are sick. You should make sure you get enough calcium and vitamin D while you are taking this medicine, unless your doctor tells you not to. Discuss the foods you eat and the vitamins you take with your health care professional. See your dentist regularly. Brush and floss your teeth as directed. Before you have any dental work done, tell your dentist you are   receiving this medicine. Do not become pregnant while taking this medicine or for 5 months after stopping it. Talk with your doctor or health care professional about your birth control options while taking this medicine. Women should inform their doctor if they wish to become pregnant or think they might be pregnant. There is a potential for serious side  effects to an unborn child. Talk to your health care professional or pharmacist for more information. What side effects may I notice from receiving this medicine? Side effects that you should report to your doctor or health care professional as soon as possible:  allergic reactions like skin rash, itching or hives, swelling of the face, lips, or tongue  bone pain  breathing problems  dizziness  jaw pain, especially after dental work  redness, blistering, peeling of the skin  signs and symptoms of infection like fever or chills; cough; sore throat; pain or trouble passing urine  signs of low calcium like fast heartbeat, muscle cramps or muscle pain; pain, tingling, numbness in the hands or feet; seizures  unusual bleeding or bruising  unusually weak or tired Side effects that usually do not require medical attention (report to your doctor or health care professional if they continue or are bothersome):  constipation  diarrhea  headache  joint pain  loss of appetite  muscle pain  runny nose  tiredness  upset stomach This list may not describe all possible side effects. Call your doctor for medical advice about side effects. You may report side effects to FDA at 1-800-FDA-1088. Where should I keep my medicine? This medicine is only given in a clinic, doctor's office, or other health care setting and will not be stored at home. NOTE: This sheet is a summary. It may not cover all possible information. If you have questions about this medicine, talk to your doctor, pharmacist, or health care provider.  2020 Elsevier/Gold Standard (2018-03-30 16:10:44)  Octreotide injection solution What is this medicine? OCTREOTIDE (ok TREE oh tide) is used to reduce blood levels of growth hormone in patients with a condition called acromegaly. This medicine also reduces flushing and watery diarrhea caused by certain types of cancer. This medicine may be used for other purposes; ask your  health care provider or pharmacist if you have questions. COMMON BRAND NAME(S): Bynfezia, Sandostatin What should I tell my health care provider before I take this medicine? They need to know if you have any of these conditions:  diabetes  gallbladder disease  kidney disease  liver disease  thyroid disease  an unusual or allergic reaction to octreotide, other medicines, foods, dyes, or preservatives  pregnant or trying to get pregnant  breast-feeding How should I use this medicine? This medicine is for injection under the skin or into a vein (only in emergency situations). It is usually given by a health care professional in a hospital or clinic setting. If you get this medicine at home, you will be taught how to prepare and give this medicine. Allow the injection solution to come to room temperature before use. Do not warm it artificially. Use exactly as directed. Take your medicine at regular intervals. Do not take your medicine more often than directed. It is important that you put your used needles and syringes in a special sharps container. Do not put them in a trash can. If you do not have a sharps container, call your pharmacist or healthcare provider to get one. Talk to your pediatrician regarding the use of this medicine in children.   Special care may be needed. Overdosage: If you think you have taken too much of this medicine contact a poison control center or emergency room at once. NOTE: This medicine is only for you. Do not share this medicine with others. What if I miss a dose? If you miss a dose, take it as soon as you can. If it is almost time for your next dose, take only that dose. Do not take double or extra doses. What may interact with this medicine?  bromocriptine  certain medicines for blood pressure, heart disease, irregular heartbeat  cyclosporine  diuretics  medicines for diabetes, including insulin  quinidine This list may not describe all possible  interactions. Give your health care provider a list of all the medicines, herbs, non-prescription drugs, or dietary supplements you use. Also tell them if you smoke, drink alcohol, or use illegal drugs. Some items may interact with your medicine. What should I watch for while using this medicine? Visit your doctor or health care professional for regular checks on your progress. To help reduce irritation at the injection site, use a different site for each injection and make sure the solution is at room temperature before use. This medicine may cause decreases in blood sugar. Signs of low blood sugar include chills, cool, pale skin or cold sweats, drowsiness, extreme hunger, fast heartbeat, headache, nausea, nervousness or anxiety, shakiness, trembling, unsteadiness, tiredness, or weakness. Contact your doctor or health care professional right away if you experience any of these symptoms. This medicine may increase blood sugar. Ask your healthcare provider if changes in diet or medicines are needed if you have diabetes. This medicine may cause a decrease in vitamin B12. You should make sure that you get enough vitamin B12 while you are taking this medicine. Discuss the foods you eat and the vitamins you take with your health care professional. What side effects may I notice from receiving this medicine? Side effects that you should report to your doctor or health care professional as soon as possible:  allergic reactions like skin rash, itching or hives, swelling of the face, lips, or tongue  fast, slow, or irregular heartbeat  right upper belly pain  severe stomach pain  signs and symptoms of high blood sugar such as being more thirsty or hungry or having to urinate more than normal. You may also feel very tired or have blurry vision.  signs and symptoms of low blood sugar such as feeling anxious; confusion; dizziness; increased hunger; unusually weak or tired; increased sweating; shakiness;  cold, clammy skin; irritable; headache; blurred vision; fast heartbeat; loss of consciousness  unusually weak or tired Side effects that usually do not require medical attention (report to your doctor or health care professional if they continue or are bothersome):  diarrhea  dizziness  gas  headache  nausea, vomiting  pain, redness, or irritation at site where injected  upset stomach This list may not describe all possible side effects. Call your doctor for medical advice about side effects. You may report side effects to FDA at 1-800-FDA-1088. Where should I keep my medicine? Keep out of the reach of children. Store in a refrigerator between 2 and 8 degrees C (36 and 46 degrees F). Protect from light. Allow to come to room temperature naturally. Do not use artificial heat. If protected from light, the injection may be stored at room temperature between 20 and 30 degrees C (70 and 86 degrees F) for 14 days. After the initial use, throw away any unused   portion of a multiple dose vial after 14 days. Throw away unused portions of the ampules after use. NOTE: This sheet is a summary. It may not cover all possible information. If you have questions about this medicine, talk to your doctor, pharmacist, or health care provider.  2020 Elsevier/Gold Standard (2019-06-20 13:33:09)  

## 2020-09-18 NOTE — Patient Instructions (Signed)

## 2020-09-24 ENCOUNTER — Ambulatory Visit (INDEPENDENT_AMBULATORY_CARE_PROVIDER_SITE_OTHER): Payer: Medicare Other | Admitting: Physician Assistant

## 2020-09-24 ENCOUNTER — Encounter: Payer: Self-pay | Admitting: Physician Assistant

## 2020-09-24 ENCOUNTER — Other Ambulatory Visit: Payer: Self-pay

## 2020-09-24 ENCOUNTER — Ambulatory Visit: Payer: Medicare Other

## 2020-09-24 VITALS — BP 112/65 | HR 74 | Temp 98.2°F | Ht 68.0 in | Wt 139.1 lb

## 2020-09-24 DIAGNOSIS — F339 Major depressive disorder, recurrent, unspecified: Secondary | ICD-10-CM | POA: Diagnosis not present

## 2020-09-24 DIAGNOSIS — Z23 Encounter for immunization: Secondary | ICD-10-CM | POA: Diagnosis not present

## 2020-09-24 DIAGNOSIS — K05219 Aggressive periodontitis, localized, unspecified severity: Secondary | ICD-10-CM | POA: Diagnosis not present

## 2020-09-24 MED ORDER — DOXYCYCLINE HYCLATE 100 MG PO TABS: 100.0000 mg | ORAL_TABLET | Freq: Two times a day (BID) | ORAL | 0 refills | Status: DC

## 2020-09-24 MED ORDER — BUSPIRONE HCL 10 MG PO TABS: 10.0000 mg | ORAL_TABLET | Freq: Two times a day (BID) | ORAL | 2 refills | Status: DC

## 2020-09-24 NOTE — Patient Instructions (Signed)

## 2020-09-24 NOTE — Progress Notes (Signed)
 Established Patient Office Visit  Subjective:  Patient ID: Cindy Byrd, female    DOB: 09/06/1941  Age: 79 y.o. MRN: [id number]  CC:  Chief Complaint  Patient presents with  . cyst on jaw    HPI Cindy Byrd presents for concerns of cyst on jaw.  Reports a knot appeared last week on her jaw (left side).  States not grew over the weekend and felt like a toothache.  Since then not has improved and is less tender and decreased in size.  Denies fever, drainage, or chills.  Has applied warm compresses which did not help.  Mood: States going through difficult situation with her partner of 20 years.  He has left her for a younger female but they both own the house so he is in and out.  Inquiring about increasing Prozac to help with mood.  Denies SI HI.  Insomnia: Reports has been taking trazodone 50 mg to help with sleep but continues to have problems with staying asleep.  She usually sleeps for about 3 hours and then wakes up.  Reports she has about half a cup of coffee in the mornings.  Past Medical History:  Diagnosis Date  . Barrett's esophagus   . Bone neoplasm 06/24/2015  . Cancer (HCC)    metastatic poorly differentiated carcinoma. tumor left groin surgical removal with radiation tx.  . Cataract    BILATERAL  . Cigarette smoker two packs a day or less    Currently still smoking 2 PPD - Not interested in quitting at this time.  . Colitis 2017  . Colon polyps    hyperplastic, tubular adenomas, tubulovillous adenoma  . Cough, persistent    hx. lung cancer ? primary-being evaluated, unsure of primary site.  . Depression 06/24/2015  . Diverticulosis   . Emphysema of lung (HCC)   . Endometriosis    Hysterectomy with BSO at age 46 yrs  . Esophageal adenocarcinoma (HCC) 08/11/15   intramucosal  . Gastritis   . GERD (gastroesophageal reflux disease)   . H/O: pneumonia   . Hiatal hernia   . Hyperlipidemia   . Hypertension 06/24/2015   likely improved incidental to 40 lbs  weight loss from her neoplasm. No Longer taking med for this as of 08-06-15  . IBS (irritable bowel syndrome)   . Pain    left hip-persistent"tumor of bone"-radiation tx. 10.  . Vitamin D deficiency disease     Past Surgical History:  Procedure Laterality Date  . ABDOMINAL HYSTERECTOMY    . BALLOON DILATION N/A 10/08/2019   Procedure: BALLOON DILATION;  Surgeon: Pyrtle, Jay M, MD;  Location: [facility];  Service: Gastroenterology;  Laterality: N/A;  . BARTHOLIN GLAND CYST EXCISION  79 yo ago   Does not want if it was an infected cyst or tumor. Was soon as delivery  . BIOPSY  01/02/2019   Procedure: BIOPSY;  Surgeon: Pyrtle, Jay M, MD;  Location: [facility];  Service: Gastroenterology;;  . CATARACT EXTRACTION    . COLONOSCOPY W/ POLYPECTOMY     multiple times - last done 09/2014 per patient.  . ESOPHAGOGASTRODUODENOSCOPY (EGD) WITH PROPOFOL N/A 08/11/2015   Procedure: ESOPHAGOGASTRODUODENOSCOPY (EGD) WITH PROPOFOL;  Surgeon: Jay M Pyrtle, MD;  Location: [facility];  Service: Gastroenterology;  Laterality: N/A;  . ESOPHAGOGASTRODUODENOSCOPY (EGD) WITH PROPOFOL N/A 01/02/2019   Procedure: ESOPHAGOGASTRODUODENOSCOPY (EGD) WITH PROPOFOL;  Surgeon: Pyrtle, Jay M, MD;  Location: [facility];  Service: Gastroenterology;  Laterality: N/A;  . ESOPHAGOGASTRODUODENOSCOPY (EGD) WITH PROPOFOL N/A   10/08/2019   Procedure: ESOPHAGOGASTRODUODENOSCOPY (EGD) WITH PROPOFOL;  Surgeon: Pyrtle, Jay M, MD;  Location: [facility];  Service: Gastroenterology;  Laterality: N/A;  . FLEXIBLE SIGMOIDOSCOPY N/A 06/24/2017   Procedure: FLEXIBLE SIGMOIDOSCOPY;  Surgeon: Armbruster, Steven Paul, MD;  Location: [facility];  Service: Gastroenterology;  Laterality: N/A;  . GANGLION CYST EXCISION    . KNEE ARTHROSCOPY  age about 54 yrs  . TONSILLECTOMY    . TOTAL ABDOMINAL HYSTERECTOMY W/ BILATERAL SALPINGOOPHORECTOMY  at age 46 yrs   For endometriosis    Family History  Problem Relation Age of Onset  . Colon cancer  Brother   . Colon cancer Brother   . Stroke Mother   . Colon cancer Father   . Emphysema Father        smoked  . Breast cancer Daughter 42       ER/PR+ stage II    Social History   Socioeconomic History  . Marital status: Widowed    Spouse name: Not on file  . Number of children: 2  . Years of education: Not on file  . Highest education level: Not on file  Occupational History  . Not on file  Tobacco Use  . Smoking status: Former Smoker    Packs/day: 1.00    Years: 60.00    Pack years: 60.00    Types: Cigarettes    Quit date: 12/05/2014    Years since quitting: 5.8  . Smokeless tobacco: Never Used  Vaping Use  . Vaping Use: Never used  Substance and Sexual Activity  . Alcohol use: No    Alcohol/week: 0.0 standard drinks  . Drug use: No  . Sexual activity: Not Currently  Other Topics Concern  . Not on file  Social History Narrative  . Not on file   Social Determinants of Health   Financial Resource Strain:   . Difficulty of Paying Living Expenses: Not on file  Food Insecurity:   . Worried About Running Out of Food in the Last Year: Not on file  . Ran Out of Food in the Last Year: Not on file  Transportation Needs:   . Lack of Transportation (Medical): Not on file  . Lack of Transportation (Non-Medical): Not on file  Physical Activity:   . Days of Exercise per Week: Not on file  . Minutes of Exercise per Session: Not on file  Stress:   . Feeling of Stress : Not on file  Social Connections:   . Frequency of Communication with Friends and Family: Not on file  . Frequency of Social Gatherings with Friends and Family: Not on file  . Attends Religious Services: Not on file  . Active Member of Clubs or Organizations: Not on file  . Attends Club or Organization Meetings: Not on file  . Marital Status: Not on file  Intimate Partner Violence:   . Fear of Current or Ex-Partner: Not on file  . Emotionally Abused: Not on file  . Physically Abused: Not on file  .  Sexually Abused: Not on file    Outpatient Medications Prior to Visit  Medication Sig Dispense Refill  . amLODipine (NORVASC) 5 MG tablet TAKE 1 TABLET BY MOUTH EVERY DAY 30 tablet 1  . Biotin (BIOTIN 5000) 5 MG CAPS Take 5 mg by mouth daily.    . Calcium Citrate-Vitamin D (CALCIUM + D PO) Take 1 tablet by mouth daily.    . Cyanocobalamin (B-12) 2500 MCG TABS Take 2,500 mcg by mouth daily.    .   FLUoxetine (PROZAC) 40 MG capsule Take 1 capsule (40 mg total) by mouth daily. 90 capsule 0  . omeprazole (PRILOSEC) 40 MG capsule Take 1 capsule (40 mg total) by mouth daily before breakfast. 30 capsule 3  . traZODone (DESYREL) 50 MG tablet TAKE 1 TABLET BY MOUTH EVERYDAY AT BEDTIME 90 tablet 2  . Vitamin D, Ergocalciferol, (DRISDOL) 1.25 MG (50000 UNIT) CAPS capsule TAKE 1 CAPSULE BY MOUTH EVERY 7 DAYS 12 capsule 3  . XARELTO 20 MG TABS tablet TAKE 1 TABLET (20 MG TOTAL) BY MOUTH DAILY WITH SUPPER. 90 tablet 0   No facility-administered medications prior to visit.    Allergies  Allergen Reactions  . Penicillins Other (See Comments)    Unknown; childhood allergy Did it involve swelling of the face/tongue/throat, SOB, or low BP? Unknown Did it involve sudden or severe rash/hives, skin peeling, or any reaction on the inside of your mouth or nose? Unknown Did you need to seek medical attention at a hospital or doctor's office? Unknown When did it last happen?childhood allergy If all above answers are "NO", may proceed with cephalosporin use.   . Remeron [Mirtazapine] Other (See Comments)    nightmares  . Latex Rash    ROS Review of Systems A fourteen system review of systems was performed and found to be positive as per HPI.   Objective:    Physical Exam General:  Well Developed, well nourished, appropriate for stated age.  Neuro:  Alert and oriented,  extra-ocular muscles intact  HEENT:  Normocephalic, atraumatic, abscess with fluctuance on left buccal mucosa noted, swelling near  mandibular condyle noted, no adenopathy Skin:  no gross rash, warm, pink. Cardiac:  RRR, S1 S2 Respiratory:  ECTA B/L, Not using accessory muscles, speaking in full sentences- unlabored. Vascular:  Ext warm, no cyanosis apprec.; cap RF less 2 sec. Psych:  No HI/SI, judgement and insight good, Euthymic mood. Full Affect.  BP 112/65   Pulse 74   Temp 98.2 F (36.8 C) (Oral)   Ht 5' 8" (1.727 m)   Wt 139 lb 1.6 oz (63.1 kg)   SpO2 97% Comment: on RA  BMI 21.15 kg/m  Wt Readings from Last 3 Encounters:  09/24/20 139 lb 1.6 oz (63.1 kg)  08/21/20 130 lb 3.2 oz (59.1 kg)  07/07/20 145 lb (65.8 kg)     Health Maintenance Due  Topic Date Due  . TETANUS/TDAP  Never done  . PNA vac Low Risk Adult (1 of 2 - PCV13) Never done  . COVID-19 Vaccine (2 - Pfizer 2-dose series) 07/30/2020    There are no preventive care reminders to display for this patient.  Lab Results  Component Value Date   TSH 2.740 12/13/2019   Lab Results  Component Value Date   WBC 7.0 07/24/2020   HGB 12.4 07/24/2020   HCT 38.3 07/24/2020   MCV 92.5 07/24/2020   PLT 182 07/24/2020   Lab Results  Component Value Date   NA 136 09/18/2020   K 4.6 09/18/2020   CHLORIDE 103 11/16/2017   CO2 28 09/18/2020   GLUCOSE 105 (H) 09/18/2020   BUN 17 09/18/2020   CREATININE 1.07 (H) 09/18/2020   BILITOT 0.4 09/18/2020   ALKPHOS 69 09/18/2020   AST 18 09/18/2020   ALT 8 09/18/2020   PROT 7.5 09/18/2020   ALBUMIN 3.2 (L) 09/18/2020   CALCIUM 9.2 09/18/2020   ANIONGAP 4 (L) 09/18/2020   EGFR >60 11/16/2017   GFR 59.23 (L) 10/12/2016   Lab   Results  Component Value Date   CHOL 178 07/03/2020   Lab Results  Component Value Date   HDL 47 07/03/2020   Lab Results  Component Value Date   LDLCALC 108 (H) 07/03/2020   Lab Results  Component Value Date   TRIG 129 07/03/2020   Lab Results  Component Value Date   CHOLHDL 3.8 07/03/2020   Lab Results  Component Value Date   HGBA1C 5.5 12/13/2019       Assessment & Plan:   Problem List Items Addressed This Visit      Other   Depression - Primary   Relevant Medications   busPIRone (BUSPAR) 10 MG tablet    Other Visit Diagnoses    Periodontal abscess       Relevant Medications   doxycycline (VIBRA-TABS) 100 MG tablet   Need for influenza vaccination       Relevant Orders   Flu Vaccine QUAD High Dose(Fluad) (Completed)      Depression: -PHQ-9 score of 11 -Discussed with patient management options including adjunct therapy.  Patient is on max dose of Prozac and is agreeable to starting BuSpar. -Advised to let me know if would like to proceed with psychology referral. -Follow-up in 8 weeks to reassess symptoms and medication therapy.  Periodontal abscess: -Signs and symptoms are suggestive of dental infection so will start doxycycline. -If symptoms fail to improve or worsen recommend further evaluation with imaging, especially with patient's history of neoplasm.    Meds ordered this encounter  Medications  . doxycycline (VIBRA-TABS) 100 MG tablet    Sig: Take 1 tablet (100 mg total) by mouth 2 (two) times daily.    Dispense:  20 tablet    Refill:  0    Order Specific Question:   Supervising Provider    Answer:   Beatrice Lecher D [[id number]]  . busPIRone (BUSPAR) 10 MG tablet    Sig: Take 1 tablet (10 mg total) by mouth 2 (two) times daily.    Dispense:  60 tablet    Refill:  2    Order Specific Question:   Supervising Provider    Answer:   Beatrice Lecher D [[id number]]    Follow-up: Return in about 8 weeks (around 11/19/2020) for Mood- added med, Insomnia- inc dose.    [name], PA-C

## 2020-09-29 ENCOUNTER — Other Ambulatory Visit: Payer: Self-pay | Admitting: Physician Assistant

## 2020-09-30 ENCOUNTER — Other Ambulatory Visit: Payer: Self-pay | Admitting: Hematology

## 2020-09-30 DIAGNOSIS — I10 Essential (primary) hypertension: Secondary | ICD-10-CM

## 2020-10-15 ENCOUNTER — Other Ambulatory Visit: Payer: Self-pay | Admitting: Hematology

## 2020-10-15 DIAGNOSIS — I10 Essential (primary) hypertension: Secondary | ICD-10-CM

## 2020-10-15 NOTE — Telephone Encounter (Signed)
Refill request. 90 day supply

## 2020-10-16 ENCOUNTER — Inpatient Hospital Stay: Payer: Medicare Other

## 2020-10-16 ENCOUNTER — Other Ambulatory Visit: Payer: Self-pay

## 2020-10-16 ENCOUNTER — Inpatient Hospital Stay: Payer: Medicare Other | Attending: Hematology

## 2020-10-16 ENCOUNTER — Ambulatory Visit: Payer: Medicare Other

## 2020-10-16 ENCOUNTER — Other Ambulatory Visit: Payer: Self-pay | Admitting: Physician Assistant

## 2020-10-16 ENCOUNTER — Other Ambulatory Visit: Payer: Medicare Other

## 2020-10-16 DIAGNOSIS — C155 Malignant neoplasm of lower third of esophagus: Secondary | ICD-10-CM

## 2020-10-16 DIAGNOSIS — Z7189 Other specified counseling: Secondary | ICD-10-CM

## 2020-10-16 DIAGNOSIS — C3490 Malignant neoplasm of unspecified part of unspecified bronchus or lung: Secondary | ICD-10-CM | POA: Diagnosis not present

## 2020-10-16 DIAGNOSIS — K529 Noninfective gastroenteritis and colitis, unspecified: Secondary | ICD-10-CM

## 2020-10-16 DIAGNOSIS — C7951 Secondary malignant neoplasm of bone: Secondary | ICD-10-CM | POA: Diagnosis not present

## 2020-10-16 DIAGNOSIS — K521 Toxic gastroenteritis and colitis: Secondary | ICD-10-CM

## 2020-10-16 DIAGNOSIS — R197 Diarrhea, unspecified: Secondary | ICD-10-CM

## 2020-10-16 DIAGNOSIS — Z95828 Presence of other vascular implants and grafts: Secondary | ICD-10-CM

## 2020-10-16 DIAGNOSIS — D500 Iron deficiency anemia secondary to blood loss (chronic): Secondary | ICD-10-CM

## 2020-10-16 DIAGNOSIS — F339 Major depressive disorder, recurrent, unspecified: Secondary | ICD-10-CM

## 2020-10-16 LAB — CMP (CANCER CENTER ONLY)
ALT: 7 U/L (ref 0–44)
AST: 21 U/L (ref 15–41)
Albumin: 3.2 g/dL — ABNORMAL LOW (ref 3.5–5.0)
Alkaline Phosphatase: 62 U/L (ref 38–126)
Anion gap: 6 (ref 5–15)
BUN: 19 mg/dL (ref 8–23)
CO2: 27 mmol/L (ref 22–32)
Calcium: 9.1 mg/dL (ref 8.9–10.3)
Chloride: 106 mmol/L (ref 98–111)
Creatinine: 1.07 mg/dL — ABNORMAL HIGH (ref 0.44–1.00)
GFR, Estimated: 53 mL/min — ABNORMAL LOW
Glucose, Bld: 90 mg/dL (ref 70–99)
Potassium: 5.1 mmol/L (ref 3.5–5.1)
Sodium: 139 mmol/L (ref 135–145)
Total Bilirubin: 0.5 mg/dL (ref 0.3–1.2)
Total Protein: 7.4 g/dL (ref 6.5–8.1)

## 2020-10-16 MED ORDER — OCTREOTIDE ACETATE 30 MG IM KIT
PACK | INTRAMUSCULAR | Status: AC
  Filled 2020-10-16: qty 1

## 2020-10-16 MED ORDER — OCTREOTIDE ACETATE 30 MG IM KIT
30.0000 mg | PACK | Freq: Once | INTRAMUSCULAR | Status: AC
  Administered 2020-10-16: 30 mg via INTRAMUSCULAR

## 2020-10-16 MED ORDER — SODIUM CHLORIDE 0.9% FLUSH
10.0000 mL | Freq: Once | INTRAVENOUS | Status: AC
  Administered 2020-10-16: 10 mL
  Filled 2020-10-16: qty 10

## 2020-10-16 MED ORDER — HEPARIN SOD (PORK) LOCK FLUSH 100 UNIT/ML IV SOLN
250.0000 [IU] | Freq: Once | INTRAVENOUS | Status: AC
  Administered 2020-10-16: 500 [IU]
  Filled 2020-10-16: qty 5

## 2020-10-16 NOTE — Patient Instructions (Signed)

## 2020-10-16 NOTE — Patient Instructions (Signed)
Octreotide injection solution What is this medicine? OCTREOTIDE (ok TREE oh tide) is used to reduce blood levels of growth hormone in patients with a condition called acromegaly. This medicine also reduces flushing and watery diarrhea caused by certain types of cancer. This medicine may be used for other purposes; ask your health care provider or pharmacist if you have questions. COMMON BRAND NAME(S): Bynfezia, Sandostatin What should I tell my health care provider before I take this medicine? They need to know if you have any of these conditions:  diabetes  gallbladder disease  kidney disease  liver disease  thyroid disease  an unusual or allergic reaction to octreotide, other medicines, foods, dyes, or preservatives  pregnant or trying to get pregnant  breast-feeding How should I use this medicine? This medicine is for injection under the skin or into a vein (only in emergency situations). It is usually given by a health care professional in a hospital or clinic setting. If you get this medicine at home, you will be taught how to prepare and give this medicine. Allow the injection solution to come to room temperature before use. Do not warm it artificially. Use exactly as directed. Take your medicine at regular intervals. Do not take your medicine more often than directed. It is important that you put your used needles and syringes in a special sharps container. Do not put them in a trash can. If you do not have a sharps container, call your pharmacist or healthcare provider to get one. Talk to your pediatrician regarding the use of this medicine in children. Special care may be needed. Overdosage: If you think you have taken too much of this medicine contact a poison control center or emergency room at once. NOTE: This medicine is only for you. Do not share this medicine with others. What if I miss a dose? If you miss a dose, take it as soon as you can. If it is almost time for your  next dose, take only that dose. Do not take double or extra doses. What may interact with this medicine?  bromocriptine  certain medicines for blood pressure, heart disease, irregular heartbeat  cyclosporine  diuretics  medicines for diabetes, including insulin  quinidine This list may not describe all possible interactions. Give your health care provider a list of all the medicines, herbs, non-prescription drugs, or dietary supplements you use. Also tell them if you smoke, drink alcohol, or use illegal drugs. Some items may interact with your medicine. What should I watch for while using this medicine? Visit your doctor or health care professional for regular checks on your progress. To help reduce irritation at the injection site, use a different site for each injection and make sure the solution is at room temperature before use. This medicine may cause decreases in blood sugar. Signs of low blood sugar include chills, cool, pale skin or cold sweats, drowsiness, extreme hunger, fast heartbeat, headache, nausea, nervousness or anxiety, shakiness, trembling, unsteadiness, tiredness, or weakness. Contact your doctor or health care professional right away if you experience any of these symptoms. This medicine may increase blood sugar. Ask your healthcare provider if changes in diet or medicines are needed if you have diabetes. This medicine may cause a decrease in vitamin B12. You should make sure that you get enough vitamin B12 while you are taking this medicine. Discuss the foods you eat and the vitamins you take with your health care professional. What side effects may I notice from receiving this medicine? Side   effects that you should report to your doctor or health care professional as soon as possible:  allergic reactions like skin rash, itching or hives, swelling of the face, lips, or tongue  fast, slow, or irregular heartbeat  right upper belly pain  severe stomach pain  signs  and symptoms of high blood sugar such as being more thirsty or hungry or having to urinate more than normal. You may also feel very tired or have blurry vision.  signs and symptoms of low blood sugar such as feeling anxious; confusion; dizziness; increased hunger; unusually weak or tired; increased sweating; shakiness; cold, clammy skin; irritable; headache; blurred vision; fast heartbeat; loss of consciousness  unusually weak or tired Side effects that usually do not require medical attention (report to your doctor or health care professional if they continue or are bothersome):  diarrhea  dizziness  gas  headache  nausea, vomiting  pain, redness, or irritation at site where injected  upset stomach This list may not describe all possible side effects. Call your doctor for medical advice about side effects. You may report side effects to FDA at 1-800-FDA-1088. Where should I keep my medicine? Keep out of the reach of children. Store in a refrigerator between 2 and 8 degrees C (36 and 46 degrees F). Protect from light. Allow to come to room temperature naturally. Do not use artificial heat. If protected from light, the injection may be stored at room temperature between 20 and 30 degrees C (70 and 86 degrees F) for 14 days. After the initial use, throw away any unused portion of a multiple dose vial after 14 days. Throw away unused portions of the ampules after use. NOTE: This sheet is a summary. It may not cover all possible information. If you have questions about this medicine, talk to your doctor, pharmacist, or health care provider.  2020 Elsevier/Gold Standard (2019-06-20 13:33:09)  

## 2020-11-11 ENCOUNTER — Other Ambulatory Visit: Payer: Self-pay | Admitting: Hematology

## 2020-11-11 ENCOUNTER — Other Ambulatory Visit: Payer: Self-pay | Admitting: Physician Assistant

## 2020-11-12 ENCOUNTER — Other Ambulatory Visit: Payer: Self-pay | Admitting: *Deleted

## 2020-11-12 DIAGNOSIS — C155 Malignant neoplasm of lower third of esophagus: Secondary | ICD-10-CM

## 2020-11-12 NOTE — Progress Notes (Signed)
HEMATOLOGY ONCOLOGY PROGRESS NOTE  Date of service:  11/13/20    Patient Care Team: [name], PA-C as PCP - General (Physician Assistant) [name], MD as Consulting Physician (Hematology and Oncology)  CHIEF COMPLAINTS/PURPOSE OF CONSULTATION:  F/u for metastatic lung cancer and esophageal cancer  DIAGNOSIS:   #1 Metastatic non-small cell lung cancer with bilateral lung nodules and large metastatic lesion in the left Ilium. 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 Also had c diff colitis - treated   Current Treatment  1) Active surveillance 2) Xgeva 132m [place] q4weeks for bone metastases. 3) Sandostatin q4weeks for diarrhea - immune colitis  Previous Treatment  For metastatic lung cancer 1 Palliative radiation therapy to the large left ilium metastases 2. IV Nivolumab x 20ycles (discontinued due to likely immune colitis) 3. Xgeva 1282mSC q4weeks for bone metastases.  For Esophageal adenocarcinoma S/p concurrent carbo/taxol + RT  HISTORY OF PRESENTING ILLNESS: (plz see my previous consultation for details of initial presentation)  INTERVAL HISTORY:  Ms ToHerrins presenting today for her scheduled follow-up for metastatic lung cancer, and adenocarcinoma of the esophagus. The patient's last visit with [place] on 08/21/2020. The pt reports that she is doing well overall.  The pt reports that she had a toothache, for which she was given an antibiotic. She denies any bone pain or major dental issues. Pt is eating well and is not currently having any issues swallowing.   Pt is doing better mentally after gaining some distance from her previous partner and spending more time with her family.  Lab results today (11/13/20) of CBC w/diff is as follows: all values are WNL except for RBC at 3.62, Hgb at 10.9, HCT at 33.8.  On review of systems, pt denies constipation, dysphagia, low appetite,  blood/black stools, stress and any other symptoms.    MEDICAL HISTORY:  Past Medical History:  Diagnosis Date  . Barrett's esophagus   . Bone neoplasm 06/24/2015  [facility]   metastatic poorly differentiated carcinoma. tumor left groin surgical removal with radiation tx.  . Cataract    BILATERAL  . Cigarette smoker two packs a day or less    Currently still smoking 2 PPD - Not interested in quitting at this time.  . Colitis 2017  . Colon polyps    hyperplastic, tubular adenomas, tubulovillous adenoma  . Cough, persistent    hx. lung cancer ? primary-being evaluated, unsure of primary site.  . Depression 06/24/2015  . Diverticulosis   . Em[place]se[place] of lung ([facility]  . Endometriosis    Hysterectomy with BSO at age [age]rs  . Esophageal adenocarcinoma [place]   intramucosal  . Gastritis   . GERD (gastroesophageal reflux disease)   . H/O: pneumonia   . Hiatal hernia   . Hyperlipidemia   . Hypertension 06/24/2015   likely improved incidental to 40 lbs weight loss from her neoplasm. No Longer taking med for this as of 08-06-15  . IBS (irritable bowel syndrome)   . Pain    left hip-persistent"tumor of bone"-radiation tx. 10.  . Vitamin D deficiency disease    SURGICAL HISTORY: Past Surgical History:  Procedure Laterality Date  . ABDOMINAL HYSTERECTOMY    . BALLOON DILATION N/A 10/08/2019   Procedure: BALLOON DILATION;  Surgeon: PyJerene BearsMD;  [facility];  Service: Gastroenterology;  Laterality: N/A;  . BARTHOLIN GLAND CYST EXCISION  5238o ago  Does not want if it was an infected cyst or tumor. Was soon as delivery  . BIOPSY  01/02/2019   Procedure: BIOPSY;  Surgeon: [name], MD;  [facility];  Service: Gastroenterology;;  . CATARACT EXTRACTION    . COLONOSCOPY W/ POLYPECTOMY     multiple times - last done 09/2014 per patient.  . ESOPHAGOGASTRODUODENOSCOPY (EGD) WITH PROPOFOL N/A 08/11/2015   Procedure: ESOPHAGOGASTRODUODENOSCOPY (EGD) WITH  PROPOFOL;  Surgeon: [name], MD;  Location: [facility];  Service: Gastroenterology;  Laterality: N/A;  . ESOPHAGOGASTRODUODENOSCOPY (EGD) WITH PROPOFOL N/A 01/02/2019   Procedure: ESOPHAGOGASTRODUODENOSCOPY (EGD) WITH PROPOFOL;  Surgeon: [name], MD;  Location: [facility];  Service: Gastroenterology;  Laterality: N/A;  . ESOPHAGOGASTRODUODENOSCOPY (EGD) WITH PROPOFOL N/A 10/08/2019   Procedure: ESOPHAGOGASTRODUODENOSCOPY (EGD) WITH PROPOFOL;  Surgeon: [name], MD;  Location: [facility];  Service: Gastroenterology;  Laterality: N/A;  . FLEXIBLE SIGMOIDOSCOPY N/A 06/24/2017   Procedure: FLEXIBLE SIGMOIDOSCOPY;  Surgeon: Manus Gunning, MD;  Location: [facility];  Service: Gastroenterology;  Laterality: N/A;  . GANGLION CYST EXCISION    . KNEE ARTHROSCOPY  age about 4 yrs  . TONSILLECTOMY    . TOTAL ABDOMINAL HYSTERECTOMY W/ BILATERAL SALPINGOOPHORECTOMY  at age 1 yrs   For endometriosis    SOCIAL HISTORY: Social History   Socioeconomic History  . Marital status: Widowed    Spouse name: Not on file  . Number of children: 2  . Years of education: Not on file  . Highest education level: Not on file  Occupational History  . Not on file  Tobacco Use  . Smoking status: Former Smoker    Packs/day: 1.00    Years: 60.00    Pack years: 60.00    Types: Cigarettes    Quit date: 12/05/2014    Years since quitting: 5.9  . Smokeless tobacco: Never Used  Vaping Use  . Vaping Use: Never used  Substance and Sexual Activity  . Alcohol use: No    Alcohol/week: 0.0 standard drinks  . Drug use: No  . Sexual activity: Not Currently  Other Topics Concern  . Not on file  Social History Narrative  . Not on file   Social Determinants of Health   Financial Resource Strain: Not on file  Food Insecurity: Not on file  Transportation Needs: Not on file  Physical Activity: Not on file  Stress: Not on file  Social Connections: Not on file  Intimate Partner Violence:  Not on file    FAMILY HISTORY: Family History  Problem Relation Age of Onset  . Colon cancer Brother   . Colon cancer Brother   . Stroke Mother   . Colon cancer Father   . Emphysema Father        smoked  . Breast cancer Daughter 57       ER/PR+ stage II    ALLERGIES:  is allergic to penicillins, remeron [mirtazapine], and latex. patient wonders if she has a penicillin allergy but notes that she is uncertain about this.  MEDICATIONS:  Current Outpatient Medications  Medication Sig Dispense Refill  . amLODipine (NORVASC) 5 MG tablet TAKE 1 TABLET BY MOUTH EVERY DAY 90 tablet 1  . Biotin (BIOTIN 5000) 5 MG CAPS Take 5 mg by mouth daily.    . busPIRone (BUSPAR) 10 MG tablet Take 1 tablet (10 mg total) by mouth 2 (two) times daily. 60 tablet 2  . Calcium Citrate-Vitamin D (CALCIUM + D PO) Take 1 tablet by mouth  daily.    . Cyanocobalamin (B-12) 2500 MCG TABS Take 2,500 mcg by mouth daily.    [name] doxycycline (VIBRA-TABS) 100 MG tablet Take 1 tablet (100 mg total) by mouth 2 (two) times daily. 20 tablet 0  . FLUoxetine (PROZAC) 40 MG capsule TAKE 1 CAPSULE BY MOUTH EVERY DAY 90 capsule 0  . omeprazole (PRILOSEC) 40 MG capsule Take 1 capsule (40 mg total) by mouth daily before breakfast. 30 capsule 3  . traZODone (DESYREL) 50 MG tablet TAKE 1 TABLET BY MOUTH EVERYDAY AT BEDTIME 90 tablet 2  . Vitamin D, Ergocalciferol, (DRISDOL) 1.25 MG (50000 UNIT) CAPS capsule TAKE 1 CAPSULE BY MOUTH EVERY 7 DAYS 12 capsule 3  . XARELTO 20 MG TABS tablet TAKE 1 TABLET (20 MG TOTAL) BY MOUTH DAILY WITH SUPPER. 90 tablet 0   No current facility-administered medications for this visit.    REVIEW OF SYSTEMS:   A 10+ POINT REVIEW OF SYSTEMS WAS OBTAINED including neurology, dermatology, psychiatry, cardiac, respiratory, lymph, extremities, GI, GU, Musculoskeletal, constitutional, breasts, reproductive, HEENT.  All pertinent positives are noted in the HPI.  All others are negative.   PHYSICAL  EXAMINATION: ECOG PERFORMANCE STATUS: 2 - Symptomatic, <50% confined to bed  Vitals:   11/13/20 1020  BP: 135/62  Pulse: 73  Resp: 18  Temp: (!) 97.1 F (36.2 C)  SpO2: 98%   Filed Weights   11/13/20 1020  Weight: 132 lb (59.9 kg)  .  Wt Readings from Last 3 Encounters:  11/13/20 132 lb (59.9 kg)  09/24/20 139 lb 1.6 oz (63.1 kg)  08/21/20 130 lb 3.2 oz (59.1 kg)   GENERAL:alert, in no acute distress and comfortable SKIN: no acute rashes, no significant lesions EYES: conjunctiva are pink and non-injected, sclera anicteric OROPHARYNX: MMM, no exudates, no oropharyngeal erythema or ulceration NECK: supple, no JVD LYMPH:  no palpable lymphadenopathy in the cervical, axillary or inguinal regions LUNGS: clear to auscultation b/l with normal respiratory effort HEART: regular rate & rhythm ABDOMEN:  normoactive bowel sounds , non tender, not distended. No palpable hepatosplenomegaly.  Extremity: no pedal edema PSYCH: alert & oriented x 3 with fluent speech NEURO: no focal motor/sensory deficits  LABORATORY DATA:  I have reviewed the data as listed  . CBC Latest Ref Rng & Units 11/13/2020 07/24/2020 06/26/2020  WBC 4.0 - 10.5 K/uL 7.6 7.0 7.2  Hemoglobin 12.0 - 15.0 g/dL 10.9(L) 12.4 12.6  Hematocrit 36.0 - 46.0 % 33.8(L) 38.3 37.8  Platelets 150 - 400 K/uL 364 182 166   ANC 1.8k . CMP Latest Ref Rng & Units 10/16/2020 09/18/2020 08/21/2020  Glucose 70 - 99 mg/dL 90 105(H) 104(H)  BUN 8 - 23 mg/dL 19 17 23   Creatinine 0.44 - 1.00 mg/dL 1.07(H) 1.07(H) 1.10(H)  Sodium 135 - 145 mmol/L 139 136 138  Potassium 3.5 - 5.1 mmol/L 5.1 4.6 4.1  Chloride 98 - 111 mmol/L 106 104 108  CO2 22 - 32 mmol/L 27 28 21(L)  Calcium 8.9 - 10.3 mg/dL 9.1 9.2 9.4  Total Protein 6.5 - 8.1 g/dL 7.4 7.5 7.8  Total Bilirubin 0.3 - 1.2 mg/dL 0.5 0.4 0.4  Alkaline Phos 38 - 126 U/L 62 69 60  AST 15 - 41 U/L 21 18 21   ALT 0 - 44 U/L 7 8 9        01/02/19 Esophagus Biopsy:    RADIOGRAPHIC  STUDIES:  .No results found.  ASSESSMENT & PLAN:   79 y.o. female with  #1 Metastatic poorly differentiated  carcinoma with likely lung primary non-small cell lung cancer.   CT of the head with and without contrast showed no evidence of metastatic disease. EGFR blood test mutation analysis negative. CT chest abdomen pelvis 04/19/2016 shows no evidence of disease progression. Patient tolerated Nivolumab very well but was discontinued when she developed grade 2 Immune colitis. Has been off Nivolumab for >6 months  CT chest abdomen pelvis on 06/24/2016 shows no evidence of new disease or progression of metastatic disease. CT chest abdomen pelvis 09/06/2016 shows 1. Mixed interval response to therapy. 2. There is a new left ventral chest wall lesion deep to the pectoralis musculature worrisome for metastatic disease. 3. Posterior lower lobe nodular densities are identified which may reflect areas of pulmonary metastasis. 4. Interval decrease in size of destructive lesion involving the left iliac bone.  CT chest abd pelvis 12/08/2016: Cystic mass involving the left ventral chest wall has resolved in the interval. Likely was a hematoma due to trauma. Interval increase in size of pleural base mass overlying the posterior and inferior left lower lobe. There is also a new left pleural effusion identified.  CT chest 02/01/2017: Residual irregular soft tissue thickening/volume loss and trace left pleural fluid at the base of the left hemithorax, overall improved in appearance from 12/08/2016. No measurable lesion.  CT chest 05/29/2017 shows no residual pleural based mass or significant pleural effusion in the left hemithorax. No evidence of thoracic metastatic disease. No evidence of progressive metastatic disease within the abdomen or pelvis. Mixed lytic and blastic lesion involving the left iliac bone and associated pathologic fracture are unchanged.   CT CAP 09/14/17 shows no new changes. She does have  slight displacement of her fractured left iliac bone. Evidence of stable disease.   CT CAP 01/04/2018- No new or progressive metastatic disease. Stable large left iliac bone metastasis with associated chronic pathologic fracture.   CT chest/abd/pelvis done on 04/26/18 revealed Stable exam.  No new or progressive interval findings.  07/19/18 CT C/A/P revealed Stable exam.  No new or progressive interval findings. Large destructive left iliac lesion is similar to prior. Aortic Atherosclerosis and Emphysema.    11/06/18 CT C/A/P revealed Similar appearance of large mixed lytic and sclerotic lesion in the left ilium. No new metastatic lesions are otherwise noted elsewhere in the chest, abdomen or pelvis. 2. Interval development of thickening of the distal third of the esophagus. This is nonspecific, and could be related to underlying reflux esophagitis. However, if there is any clinical concern for Barrett's metaplasia or esophageal neoplasia, further evaluation with nonemergent endoscopy could be considered. 3. Aortic atherosclerosis, in addition to left main coronary artery disease. Assessment for potential risk factor modification, dietary therapy or pharmacologic therapy may be warranted, if clinically Indicated. 4. Diffuse bronchial wall thickening with mild to moderate centrilobular and paraseptal emphysema; imaging findings suggestive of underlying COPD. 5. Additional incidental findings, as above.  #2  Adenocarcinoma of the Esophagus  Barrett's esophagus 4cms in the distal esophagus with low and high-grade dysplasia  01/02/19 Surgical pathology revealed adenocarcinoma of the esophagus   01/25/19 PET/CT revealed Distal esophageal primary, without hypermetabolic metastatic disease. 2. Chronic left iliac metastasis, as before. 3. Hypermetabolism within and superficial to the right gluteal musculature is most likely related to trauma and/or injection sites. 4. Aortic atherosclerosis, coronary artery  atherosclerosis and emphysema.  S/p concurrent Carboplatin and Taxol weekly with RT of 45 Gy in 25 fractions and 5.4 Gy boost, completed between 02/04/19 and 03/27/19  07/03/2019 PET skull  base to thigh revealed "1. Interval response to therapy. Significant reduction in FDG uptake associated with distal esophageal mass. SUV max currently 2.61 versus 16.97 previously. 2. 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 Unchanged 3. Aortic Atherosclerosis (ICD10-I70.0) and Emphysema (ICD10-J43.9). Coronary artery calcifications."  07/15/2020 CT C/A/P ([id number]) ([id number]) revealed "1. No evidence of new or progressive metastatic disease in the chest, abdomen or pelvis."  #3 diarrhea-  now resolved was previously. 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 She was previously on on Lialda, budesonide,probiotics and lomotil but not currently taking any of these. Plan -Continue Sandostatin every 4 weeks   #4 h/o DVT and PE  -continue on Xarelto - no issues with bleeding   #6 Dsypnea 03/14/19 ECHO revealed LV EF of 60-65% 03/06/19 CXR revealed clear lung fields, normal heart size 03/07/19 and 02/25/19 EKGs, no overt concern but some decreased QRS amplitude Did refer to pulmonology for further evaluation and lung function testing  Began steroid inhaler to mitigate possible inflammation in airway, could be some radiation related scarring and emphysema   PLAN: -Discussed pt labwork today, 11/13/20; new anemia - possibly from recent infection, other blood counts are nml.  -The pt shows no lab or clinical evidence of Metastatic Lung Cancer or Esophageal Adenocarcinoma at this time. -Will continue Sandostatin q4weeks and Xgeva q12weeks. The pt has no prohibitive toxicities. -Discussed CDC guidelines regarding the COVID19 booster. Will give in clinic today. -Recommend pt begin a  daily multivitamin.  -Continue smoking cessation and yogurt for gut health. -Continue PO Magnesium replacement  -Continue 50K IU Vitamin D weekly -Rx Ergocalciferol -Will see back in 2 months with labs    FOLLOW UP: Covid booster vaccine today -continue Sandostatin q4weeks plz schedule next 4 doses -change Xgeva to every 12 weeks with labs - plz schedule next 3 doses. -portflush with each lab -MD visit in 2 months with portflush and labs   The total time spent in the appt was 30 minutes and more than 50% was on counseling and direct patient cares.  All of the patient's questions were answered with apparent satisfaction. The patient knows to call the clinic with any problems, questions or concerns.   Sullivan Lone MD [place] [facility]On[facility] Physician [facility]  (Office): [phone] (Work cell): [phone] (Fax): [phone]  I, Yevette Edwards, am acting as a scribe for Dr. Sullivan Lone.   .I have reviewed the above documentation for accuracy and completeness, and I agree with the above. [name] MD

## 2020-11-12 NOTE — Telephone Encounter (Signed)
Please review for refill.  

## 2020-11-13 ENCOUNTER — Inpatient Hospital Stay: Payer: Medicare Other

## 2020-11-13 ENCOUNTER — Other Ambulatory Visit: Payer: Self-pay

## 2020-11-13 ENCOUNTER — Inpatient Hospital Stay (HOSPITAL_BASED_OUTPATIENT_CLINIC_OR_DEPARTMENT_OTHER): Payer: Medicare Other | Admitting: Hematology

## 2020-11-13 ENCOUNTER — Inpatient Hospital Stay: Payer: Medicare Other | Attending: Hematology

## 2020-11-13 VITALS — BP 135/62 | HR 73 | Temp 97.1°F | Resp 18 | Ht 68.0 in | Wt 132.0 lb

## 2020-11-13 DIAGNOSIS — C3490 Malignant neoplasm of unspecified part of unspecified bronchus or lung: Secondary | ICD-10-CM

## 2020-11-13 DIAGNOSIS — K521 Toxic gastroenteritis and colitis: Secondary | ICD-10-CM | POA: Diagnosis not present

## 2020-11-13 DIAGNOSIS — Z90710 Acquired absence of both cervix and uterus: Secondary | ICD-10-CM | POA: Diagnosis not present

## 2020-11-13 DIAGNOSIS — C155 Malignant neoplasm of lower third of esophagus: Secondary | ICD-10-CM

## 2020-11-13 DIAGNOSIS — Z923 Personal history of irradiation: Secondary | ICD-10-CM | POA: Insufficient documentation

## 2020-11-13 DIAGNOSIS — Z9221 Personal history of antineoplastic chemotherapy: Secondary | ICD-10-CM | POA: Insufficient documentation

## 2020-11-13 DIAGNOSIS — Z95828 Presence of other vascular implants and grafts: Secondary | ICD-10-CM

## 2020-11-13 DIAGNOSIS — C7951 Secondary malignant neoplasm of bone: Secondary | ICD-10-CM | POA: Insufficient documentation

## 2020-11-13 DIAGNOSIS — R197 Diarrhea, unspecified: Secondary | ICD-10-CM

## 2020-11-13 DIAGNOSIS — C3491 Malignant neoplasm of unspecified part of right bronchus or lung: Secondary | ICD-10-CM | POA: Diagnosis not present

## 2020-11-13 DIAGNOSIS — K529 Noninfective gastroenteritis and colitis, unspecified: Secondary | ICD-10-CM

## 2020-11-13 DIAGNOSIS — Z7189 Other specified counseling: Secondary | ICD-10-CM

## 2020-11-13 DIAGNOSIS — Z79899 Other long term (current) drug therapy: Secondary | ICD-10-CM | POA: Insufficient documentation

## 2020-11-13 DIAGNOSIS — I10 Essential (primary) hypertension: Secondary | ICD-10-CM | POA: Diagnosis not present

## 2020-11-13 LAB — CBC WITH DIFFERENTIAL (CANCER CENTER ONLY)
Abs Immature Granulocytes: 0.05 10*3/uL (ref 0.00–0.07)
Basophils Absolute: 0.1 10*3/uL (ref 0.0–0.1)
Basophils Relative: 1 %
Eosinophils Absolute: 0.2 10*3/uL (ref 0.0–0.5)
Eosinophils Relative: 2 %
HCT: 33.8 % — ABNORMAL LOW (ref 36.0–46.0)
Hemoglobin: 10.9 g/dL — ABNORMAL LOW (ref 12.0–15.0)
Immature Granulocytes: 1 %
Lymphocytes Relative: 13 %
Lymphs Abs: 1 10*3/uL (ref 0.7–4.0)
MCH: 30.1 pg (ref 26.0–34.0)
MCHC: 32.2 g/dL (ref 30.0–36.0)
MCV: 93.4 fL (ref 80.0–100.0)
Monocytes Absolute: 0.5 10*3/uL (ref 0.1–1.0)
Monocytes Relative: 7 %
Neutro Abs: 5.8 10*3/uL (ref 1.7–7.7)
Neutrophils Relative %: 76 %
Platelet Count: 364 10*3/uL (ref 150–400)
RBC: 3.62 MIL/uL — ABNORMAL LOW (ref 3.87–5.11)
RDW: 14 % (ref 11.5–15.5)
WBC Count: 7.6 10*3/uL (ref 4.0–10.5)
nRBC: 0 % (ref 0.0–0.2)

## 2020-11-13 MED ORDER — HEPARIN SOD (PORK) LOCK FLUSH 100 UNIT/ML IV SOLN
250.0000 [IU] | Freq: Once | INTRAVENOUS | Status: AC
Start: 2020-11-13 — End: 2020-11-13
  Administered 2020-11-13: 500 [IU]
  Filled 2020-11-13: qty 5

## 2020-11-13 MED ORDER — SODIUM CHLORIDE 0.9% FLUSH
10.0000 mL | Freq: Once | INTRAVENOUS | Status: AC
  Administered 2020-11-13: 10 mL
  Filled 2020-11-13: qty 10

## 2020-11-13 MED ORDER — OCTREOTIDE ACETATE 30 MG IM KIT
30.0000 mg | PACK | Freq: Once | INTRAMUSCULAR | Status: AC
  Administered 2020-11-13: 30 mg via INTRAMUSCULAR

## 2020-11-13 MED ORDER — OCTREOTIDE ACETATE 30 MG IM KIT
PACK | INTRAMUSCULAR | Status: AC
  Filled 2020-11-13: qty 1

## 2020-11-13 NOTE — Patient Instructions (Signed)

## 2020-11-13 NOTE — Patient Instructions (Signed)
Octreotide injection solution What is this medicine? OCTREOTIDE (ok TREE oh tide) is used to reduce blood levels of growth hormone in patients with a condition called acromegaly. This medicine also reduces flushing and watery diarrhea caused by certain types of cancer. This medicine may be used for other purposes; ask your health care provider or pharmacist if you have questions. COMMON BRAND NAME(S): Bynfezia, Sandostatin What should I tell my health care provider before I take this medicine? They need to know if you have any of these conditions:  diabetes  gallbladder disease  kidney disease  liver disease  thyroid disease  an unusual or allergic reaction to octreotide, other medicines, foods, dyes, or preservatives  pregnant or trying to get pregnant  breast-feeding How should I use this medicine? This medicine is for injection under the skin or into a vein (only in emergency situations). It is usually given by a health care professional in a hospital or clinic setting. If you get this medicine at home, you will be taught how to prepare and give this medicine. Allow the injection solution to come to room temperature before use. Do not warm it artificially. Use exactly as directed. Take your medicine at regular intervals. Do not take your medicine more often than directed. It is important that you put your used needles and syringes in a special sharps container. Do not put them in a trash can. If you do not have a sharps container, call your pharmacist or healthcare provider to get one. Talk to your pediatrician regarding the use of this medicine in children. Special care may be needed. Overdosage: If you think you have taken too much of this medicine contact a poison control center or emergency room at once. NOTE: This medicine is only for you. Do not share this medicine with others. What if I miss a dose? If you miss a dose, take it as soon as you can. If it is almost time for your  next dose, take only that dose. Do not take double or extra doses. What may interact with this medicine?  bromocriptine  certain medicines for blood pressure, heart disease, irregular heartbeat  cyclosporine  diuretics  medicines for diabetes, including insulin  quinidine This list may not describe all possible interactions. Give your health care provider a list of all the medicines, herbs, non-prescription drugs, or dietary supplements you use. Also tell them if you smoke, drink alcohol, or use illegal drugs. Some items may interact with your medicine. What should I watch for while using this medicine? Visit your doctor or health care professional for regular checks on your progress. To help reduce irritation at the injection site, use a different site for each injection and make sure the solution is at room temperature before use. This medicine may cause decreases in blood sugar. Signs of low blood sugar include chills, cool, pale skin or cold sweats, drowsiness, extreme hunger, fast heartbeat, headache, nausea, nervousness or anxiety, shakiness, trembling, unsteadiness, tiredness, or weakness. Contact your doctor or health care professional right away if you experience any of these symptoms. This medicine may increase blood sugar. Ask your healthcare provider if changes in diet or medicines are needed if you have diabetes. This medicine may cause a decrease in vitamin B12. You should make sure that you get enough vitamin B12 while you are taking this medicine. Discuss the foods you eat and the vitamins you take with your health care professional. What side effects may I notice from receiving this medicine? Side   effects that you should report to your doctor or health care professional as soon as possible:  allergic reactions like skin rash, itching or hives, swelling of the face, lips, or tongue  fast, slow, or irregular heartbeat  right upper belly pain  severe stomach pain  signs  and symptoms of high blood sugar such as being more thirsty or hungry or having to urinate more than normal. You may also feel very tired or have blurry vision.  signs and symptoms of low blood sugar such as feeling anxious; confusion; dizziness; increased hunger; unusually weak or tired; increased sweating; shakiness; cold, clammy skin; irritable; headache; blurred vision; fast heartbeat; loss of consciousness  unusually weak or tired Side effects that usually do not require medical attention (report to your doctor or health care professional if they continue or are bothersome):  diarrhea  dizziness  gas  headache  nausea, vomiting  pain, redness, or irritation at site where injected  upset stomach This list may not describe all possible side effects. Call your doctor for medical advice about side effects. You may report side effects to FDA at 1-800-FDA-1088. Where should I keep my medicine? Keep out of the reach of children. Store in a refrigerator between 2 and 8 degrees C (36 and 46 degrees F). Protect from light. Allow to come to room temperature naturally. Do not use artificial heat. If protected from light, the injection may be stored at room temperature between 20 and 30 degrees C (70 and 86 degrees F) for 14 days. After the initial use, throw away any unused portion of a multiple dose vial after 14 days. Throw away unused portions of the ampules after use. NOTE: This sheet is a summary. It may not cover all possible information. If you have questions about this medicine, talk to your doctor, pharmacist, or health care provider.  2020 Elsevier/Gold Standard (2019-06-20 13:33:09)  

## 2020-11-18 ENCOUNTER — Ambulatory Visit (INDEPENDENT_AMBULATORY_CARE_PROVIDER_SITE_OTHER): Payer: Medicare Other | Admitting: Physician Assistant

## 2020-11-18 ENCOUNTER — Other Ambulatory Visit: Payer: Self-pay

## 2020-11-18 ENCOUNTER — Encounter: Payer: Self-pay | Admitting: Physician Assistant

## 2020-11-18 VITALS — BP 135/69 | HR 64 | Ht 68.0 in | Wt 136.6 lb

## 2020-11-18 DIAGNOSIS — F339 Major depressive disorder, recurrent, unspecified: Secondary | ICD-10-CM

## 2020-11-18 DIAGNOSIS — C3490 Malignant neoplasm of unspecified part of unspecified bronchus or lung: Secondary | ICD-10-CM

## 2020-11-18 DIAGNOSIS — C155 Malignant neoplasm of lower third of esophagus: Secondary | ICD-10-CM

## 2020-11-18 MED ORDER — FLUOXETINE HCL 40 MG PO CAPS
ORAL_CAPSULE | ORAL | 1 refills | Status: DC
Start: 2020-12-28 — End: 2021-06-11

## 2020-11-18 MED ORDER — BUSPIRONE HCL 10 MG PO TABS: 10.0000 mg | ORAL_TABLET | Freq: Three times a day (TID) | ORAL | 0 refills | Status: DC

## 2020-11-18 NOTE — Progress Notes (Signed)
Established Patient Office Visit  Subjective:  Patient ID: RYN PEINE, female    DOB: 06/16/1941  Age: 79 y.o. MRN: [id number]  CC:  Chief Complaint  Patient presents with   Depression    HPI Cindy Byrd presents for follow up on mood management. States has noticed an improvement with her mood since starting Buspar. States is calmer and not as anxious. Also feels less depressed. Tolerated Buspar without issues. Denies SI/HI. States Holidays have been challenging and is ready for them to be over.  Past Medical History:  Diagnosis Date   Barrett's esophagus    Bone neoplasm 06/24/2015   Cancer [facility])    metastatic poorly differentiated carcinoma. tumor left groin surgical removal with radiation tx.   Cataract    BILATERAL   Cigarette smoker two packs a day or less    Currently still smoking 2 PPD - Not interested in quitting at this time.   Colitis 2017   Colon polyps    hyperplastic, tubular adenomas, tubulovillous adenoma   Cough, persistent    hx. lung cancer ? primary-being evaluated, unsure of primary site.   Depression 06/24/2015   Diverticulosis    Emphysema of lung ([place])    Endometriosis    Hysterectomy with BSO at age 17 yrs   Esophageal adenocarcinoma ([place]) 08/11/15   intramucosal   Gastritis    GERD (gastroesophageal reflux disease)    H/O: pneumonia    Hiatal hernia    Hyperlipidemia    Hypertension 06/24/2015   likely improved incidental to 40 lbs weight loss from her neoplasm. No Longer taking med for this as of 08-06-15   IBS (irritable bowel syndrome)    Pain    left hip-persistent"tumor of bone"-radiation tx. 10.   Vitamin D deficiency disease     Past Surgical History:  Procedure Laterality Date   ABDOMINAL HYSTERECTOMY     BALLOON DILATION N/A 10/08/2019   Procedure: BALLOON DILATION;  Surgeon: [name], MD;  Location: [facility];  Service: Gastroenterology;  Laterality: N/A;   BARTHOLIN GLAND CYST EXCISION   79 yo ago   Does not want if it was an infected cyst or tumor. Was soon as delivery   BIOPSY  01/02/2019   Procedure: BIOPSY;  Surgeon: [name], MD;  Location: [facility];  Service: Gastroenterology;;   CATARACT EXTRACTION     COLONOSCOPY W/ POLYPECTOMY     multiple times - last done 09/2014 per patient.   ESOPHAGOGASTRODUODENOSCOPY (EGD) WITH PROPOFOL N/A 08/11/2015   Procedure: ESOPHAGOGASTRODUODENOSCOPY (EGD) WITH PROPOFOL;  Surgeon: [name], MD;  Location: [facility];  Service: Gastroenterology;  Laterality: N/A;   ESOPHAGOGASTRODUODENOSCOPY (EGD) WITH PROPOFOL N/A 01/02/2019   Procedure: ESOPHAGOGASTRODUODENOSCOPY (EGD) WITH PROPOFOL;  Surgeon: [name], MD;  Location: [facility];  Service: Gastroenterology;  Laterality: N/A;   ESOPHAGOGASTRODUODENOSCOPY (EGD) WITH PROPOFOL N/A 10/08/2019   Procedure: ESOPHAGOGASTRODUODENOSCOPY (EGD) WITH PROPOFOL;  Surgeon: [name], MD;  Location: [facility];  Service: Gastroenterology;  Laterality: N/A;   FLEXIBLE SIGMOIDOSCOPY N/A 06/24/2017   Procedure: FLEXIBLE SIGMOIDOSCOPY;  Surgeon: Manus Gunning, MD;  Location: [facility];  Service: Gastroenterology;  Laterality: N/A;   GANGLION CYST EXCISION     KNEE ARTHROSCOPY  age about 52 yrs   TONSILLECTOMY     TOTAL ABDOMINAL HYSTERECTOMY W/ BILATERAL SALPINGOOPHORECTOMY  at age 42 yrs   For endometriosis    Family History  Problem Relation Age of Onset   Colon cancer Brother  Colon cancer Brother    Stroke Mother    Colon cancer Father    Emphysema Father        smoked   Breast cancer Daughter 59       ER/PR+ stage II    Social History   Socioeconomic History   Marital status: Widowed    Spouse name: Not on file   Number of children: 2   Years of education: Not on file   Highest education level: Not on file  Occupational History   Not on file  Tobacco Use   Smoking status: Former Smoker    Packs/day: 1.00    Years: 60.00     Pack years: 60.00    Types: Cigarettes    Quit date: 12/05/2014    Years since quitting: 5.9   Smokeless tobacco: Never Used  Vaping Use   Vaping Use: Never used  Substance and Sexual Activity   Alcohol use: No    Alcohol/week: 0.0 standard drinks   Drug use: No   Sexual activity: Not Currently  Other Topics Concern   Not on file  Social History Narrative   Not on file   Social Determinants of Health   Financial Resource Strain: Not on file  Food Insecurity: Not on file  Transportation Needs: Not on file  Physical Activity: Not on file  Stress: Not on file  Social Connections: Not on file  Intimate Partner Violence: Not on file    Outpatient Medications Prior to Visit  Medication Sig Dispense Refill   amLODipine (NORVASC) 5 MG tablet TAKE 1 TABLET BY MOUTH EVERY DAY 90 tablet 1   Biotin 5 MG CAPS Take 5 mg by mouth daily.     Calcium Citrate-Vitamin D (CALCIUM + D PO) Take 1 tablet by mouth daily.     Cyanocobalamin (B-12) 2500 MCG TABS Take 2,500 mcg by mouth daily.     doxycycline (VIBRA-TABS) 100 MG tablet Take 1 tablet (100 mg total) by mouth 2 (two) times daily. 20 tablet 0   omeprazole (PRILOSEC) 40 MG capsule TAKE 1 CAPSULE BY MOUTH EVERY DAY BEFORE BREAKFAST 90 capsule 1   traZODone (DESYREL) 50 MG tablet TAKE 1 TABLET BY MOUTH EVERYDAY AT BEDTIME 90 tablet 2   Vitamin D, Ergocalciferol, (DRISDOL) 1.25 MG (50000 UNIT) CAPS capsule TAKE 1 CAPSULE BY MOUTH EVERY 7 DAYS 12 capsule 3   XARELTO 20 MG TABS tablet TAKE 1 TABLET (20 MG TOTAL) BY MOUTH DAILY WITH SUPPER. 90 tablet 0   busPIRone (BUSPAR) 10 MG tablet Take 1 tablet (10 mg total) by mouth 2 (two) times daily. 60 tablet 2   FLUoxetine (PROZAC) 40 MG capsule TAKE 1 CAPSULE BY MOUTH EVERY DAY 90 capsule 0   No facility-administered medications prior to visit.    Allergies  Allergen Reactions   Penicillins Other (See Comments)    Unknown; childhood allergy Did it involve swelling of the  face/tongue/throat, SOB, or low BP? Unknown Did it involve sudden or severe rash/hives, skin peeling, or any reaction on the inside of your mouth or nose? Unknown Did you need to seek medical attention at a hospital or doctor's office? Unknown When did it last happen?childhood allergy If all above answers are NO, may proceed with cephalosporin use.    Remeron [Mirtazapine] Other (See Comments)    nightmares   Latex Rash    ROS Review of Systems A fourteen system review of systems was performed and found to be positive as per HPI.  Objective:  Physical Exam General:  Well Developed, well nourished, appropriate for stated age.  Neuro:  Alert and oriented,  extra-ocular muscles intact  HEENT:  Normocephalic, atraumatic, neck supple Skin:  no gross rash, warm, pink. Cardiac:  RRR, S1 S2 Respiratory:  ECTA B/L, Not using accessory muscles, speaking in full sentences- unlabored. Vascular:  Ext warm, no cyanosis apprec.; cap RF less 2 sec. Psych:  No HI/SI, judgement and insight good, Euthymic mood. Full Affect.  BP 135/69    Pulse 64    Ht _0  (1.727 m)    Wt 136 lb 9.6 oz (62 kg)    SpO2 98%    BMI 20.77 kg/m  Wt Readings from Last 3 Encounters:  11/18/20 136 lb 9.6 oz (62 kg)  11/13/20 132 lb (59.9 kg)  09/24/20 139 lb 1.6 oz (63.1 kg)     Health Maintenance Due  Topic Date Due   TETANUS/TDAP  Never done   PNA vac Low Risk Adult (1 of 2 - PCV13) Never done   COVID-19 Vaccine (2 - Pfizer risk 4-dose series) 07/30/2020    There are no preventive care reminders to display for this patient.  Lab Results  Component Value Date   TSH 2.740 12/13/2019   Lab Results  Component Value Date   WBC 7.6 11/13/2020   HGB 10.9 (L) 11/13/2020   HCT 33.8 (L) 11/13/2020   MCV 93.4 11/13/2020   PLT 364 11/13/2020   Lab Results  Component Value Date   NA 139 10/16/2020   K 5.1 10/16/2020   CHLORIDE 103 11/16/2017   CO2 27 10/16/2020   GLUCOSE 90 10/16/2020   BUN  19 10/16/2020   CREATININE 1.07 (H) 10/16/2020   BILITOT 0.5 10/16/2020   ALKPHOS 62 10/16/2020   AST 21 10/16/2020   ALT 7 10/16/2020   PROT 7.4 10/16/2020   ALBUMIN 3.2 (L) 10/16/2020   CALCIUM 9.1 10/16/2020   ANIONGAP 6 10/16/2020   EGFR >60 11/16/2017   GFR 59.23 (L) 10/12/2016   Lab Results  Component Value Date   CHOL 178 07/03/2020   Lab Results  Component Value Date   HDL 47 07/03/2020   Lab Results  Component Value Date   LDLCALC 108 (H) 07/03/2020   Lab Results  Component Value Date   TRIG 129 07/03/2020   Lab Results  Component Value Date   CHOLHDL 3.8 07/03/2020   Lab Results  Component Value Date   HGBA1C 5.5 12/13/2019      Assessment & Plan:   Problem List Items Addressed This Visit      Respiratory   Metastatic lung cancer (metastasis from lung to other site), unspecified laterality [facility])     Digestive   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)     Other   Depression - Primary   Relevant Medications   busPIRone (BUSPAR) 10 MG tablet   FLUoxetine (PROZAC) 40 MG capsule (Start on 12/28/2020)     Depression: -PHQ-9 score of 12. Discussed with patient medication adjustments by increasing Buspar 10 mg BID to TID to help improve mood and patient is agreeable. Encourage to consider [place] therapy. -Continue Prozac 40 mg. Not due for refill yet, provided future refill. -Will continue to monitor.  Malignant neoplasm of distal third of esophagus, Metastatic lung cancer: -Followed by Oncology. -On Xgeva, IV Nivolumab, and Sandostatin.   Meds ordered this encounter  Medications   busPIRone (BUSPAR) 10 MG tablet    Sig: Take 1 tablet (10 mg  total) by mouth 3 (three) times daily.    Dispense:  270 tablet    Refill:  0    Order Specific Question:   Supervising Provider    Answer:   Beatrice Lecher D [[id number]]   FLUoxetine (PROZAC) 40 MG capsule    Sig: TAKE 1 CAPSULE BY MOUTH EVERY DAY    Dispense:  90 capsule    Refill:  1     Order Specific Question:   Supervising Provider    Answer:   Beatrice Lecher D [[id number]]    Follow-up: Return in about 3 months (around 02/16/2021) for  Mood, HTN.    [name], PA-C

## 2020-11-18 NOTE — Patient Instructions (Signed)

## 2020-12-10 ENCOUNTER — Other Ambulatory Visit: Payer: Self-pay | Admitting: *Deleted

## 2020-12-10 DIAGNOSIS — C155 Malignant neoplasm of lower third of esophagus: Secondary | ICD-10-CM

## 2020-12-10 DIAGNOSIS — C3491 Malignant neoplasm of unspecified part of right bronchus or lung: Secondary | ICD-10-CM

## 2020-12-11 ENCOUNTER — Other Ambulatory Visit: Payer: Self-pay

## 2020-12-11 ENCOUNTER — Inpatient Hospital Stay: Payer: Medicare Other

## 2020-12-11 ENCOUNTER — Inpatient Hospital Stay: Payer: Medicare Other | Attending: Hematology

## 2020-12-11 DIAGNOSIS — C155 Malignant neoplasm of lower third of esophagus: Secondary | ICD-10-CM

## 2020-12-11 DIAGNOSIS — Z95828 Presence of other vascular implants and grafts: Secondary | ICD-10-CM

## 2020-12-11 DIAGNOSIS — C3490 Malignant neoplasm of unspecified part of unspecified bronchus or lung: Secondary | ICD-10-CM

## 2020-12-11 DIAGNOSIS — K529 Noninfective gastroenteritis and colitis, unspecified: Secondary | ICD-10-CM

## 2020-12-11 DIAGNOSIS — K521 Toxic gastroenteritis and colitis: Secondary | ICD-10-CM

## 2020-12-11 DIAGNOSIS — Z7189 Other specified counseling: Secondary | ICD-10-CM

## 2020-12-11 DIAGNOSIS — C3491 Malignant neoplasm of unspecified part of right bronchus or lung: Secondary | ICD-10-CM

## 2020-12-11 DIAGNOSIS — R197 Diarrhea, unspecified: Secondary | ICD-10-CM

## 2020-12-11 DIAGNOSIS — C7951 Secondary malignant neoplasm of bone: Secondary | ICD-10-CM | POA: Insufficient documentation

## 2020-12-11 LAB — IRON AND TIBC
Iron: 65 ug/dL (ref 41–142)
Saturation Ratios: 30 % (ref 21–57)
TIBC: 215 ug/dL — ABNORMAL LOW (ref 236–444)
UIBC: 150 ug/dL (ref 120–384)

## 2020-12-11 LAB — CMP (CANCER CENTER ONLY)
ALT: 8 U/L (ref 0–44)
AST: 20 U/L (ref 15–41)
Albumin: 3.1 g/dL — ABNORMAL LOW (ref 3.5–5.0)
Alkaline Phosphatase: 60 U/L (ref 38–126)
Anion gap: 7 (ref 5–15)
BUN: 16 mg/dL (ref 8–23)
CO2: 24 mmol/L (ref 22–32)
Calcium: 8.9 mg/dL (ref 8.9–10.3)
Chloride: 108 mmol/L (ref 98–111)
Creatinine: 0.88 mg/dL (ref 0.44–1.00)
GFR, Estimated: >60 mL/min (ref >60)
Glucose, Bld: 76 mg/dL (ref 70–99)
Potassium: 4.2 mmol/L (ref 3.5–5.1)
Sodium: 139 mmol/L (ref 135–145)
Total Bilirubin: 0.4 mg/dL (ref 0.3–1.2)
Total Protein: 7.3 g/dL (ref 6.5–8.1)

## 2020-12-11 LAB — CBC WITH DIFFERENTIAL (CANCER CENTER ONLY)
Abs Immature Granulocytes: 0.02 10*3/uL (ref 0.00–0.07)
Basophils Absolute: 0.1 10*3/uL (ref 0.0–0.1)
Basophils Relative: 1 %
Eosinophils Absolute: 0.1 10*3/uL (ref 0.0–0.5)
Eosinophils Relative: 2 %
HCT: 36.5 % (ref 36.0–46.0)
Hemoglobin: 11.4 g/dL — ABNORMAL LOW (ref 12.0–15.0)
Immature Granulocytes: 0 %
Lymphocytes Relative: 19 %
Lymphs Abs: 1.2 10*3/uL (ref 0.7–4.0)
MCH: 29.5 pg (ref 26.0–34.0)
MCHC: 31.2 g/dL (ref 30.0–36.0)
MCV: 94.6 fL (ref 80.0–100.0)
Monocytes Absolute: 0.7 10*3/uL (ref 0.1–1.0)
Monocytes Relative: 10 %
Neutro Abs: 4.4 10*3/uL (ref 1.7–7.7)
Neutrophils Relative %: 68 %
Platelet Count: 169 10*3/uL (ref 150–400)
RBC: 3.86 MIL/uL — ABNORMAL LOW (ref 3.87–5.11)
RDW: 14.4 % (ref 11.5–15.5)
WBC Count: 6.5 10*3/uL (ref 4.0–10.5)
nRBC: 0 % (ref 0.0–0.2)

## 2020-12-11 LAB — FERRITIN: Ferritin: 68 ng/mL (ref 11–307)

## 2020-12-11 LAB — VITAMIN B12: Vitamin B-12: 553 pg/mL (ref 180–914)

## 2020-12-11 MED ORDER — OCTREOTIDE ACETATE 30 MG IM KIT
PACK | INTRAMUSCULAR | Status: AC
  Filled 2020-12-11: qty 1

## 2020-12-11 MED ORDER — DENOSUMAB 120 MG/1.7ML ~~LOC~~ SOLN
SUBCUTANEOUS | Status: AC
  Filled 2020-12-11: qty 1.7

## 2020-12-11 MED ORDER — HEPARIN SOD (PORK) LOCK FLUSH 100 UNIT/ML IV SOLN
500.0000 [IU] | Freq: Once | INTRAVENOUS | Status: AC
  Administered 2020-12-11: 500 [IU]
  Filled 2020-12-11: qty 5

## 2020-12-11 MED ORDER — DENOSUMAB 120 MG/1.7ML ~~LOC~~ SOLN
120.0000 mg | Freq: Once | SUBCUTANEOUS | Status: AC
  Administered 2020-12-11: 120 mg via SUBCUTANEOUS

## 2020-12-11 MED ORDER — OCTREOTIDE ACETATE 30 MG IM KIT
30.0000 mg | PACK | Freq: Once | INTRAMUSCULAR | Status: AC
  Administered 2020-12-11: 30 mg via INTRAMUSCULAR

## 2020-12-11 MED ORDER — SODIUM CHLORIDE 0.9% FLUSH
10.0000 mL | Freq: Once | INTRAVENOUS | Status: AC
  Administered 2020-12-11: 10 mL
  Filled 2020-12-11: qty 10

## 2020-12-11 NOTE — Patient Instructions (Signed)
Octreotide injection solution What is this medicine? OCTREOTIDE (ok TREE oh tide) is used to reduce blood levels of growth hormone in patients with a condition called acromegaly. This medicine also reduces flushing and watery diarrhea caused by certain types of cancer. This medicine may be used for other purposes; ask your health care provider or pharmacist if you have questions. COMMON BRAND NAME(S): Bynfezia, Sandostatin What should I tell my health care provider before I take this medicine? They need to know if you have any of these conditions:  diabetes  gallbladder disease  kidney disease  liver disease  thyroid disease  an unusual or allergic reaction to octreotide, other medicines, foods, dyes, or preservatives  pregnant or trying to get pregnant  breast-feeding How should I use this medicine? This medicine is for injection under the skin or into a vein (only in emergency situations). It is usually given by a health care professional in a hospital or clinic setting. If you get this medicine at home, you will be taught how to prepare and give this medicine. Allow the injection solution to come to room temperature before use. Do not warm it artificially. Use exactly as directed. Take your medicine at regular intervals. Do not take your medicine more often than directed. It is important that you put your used needles and syringes in a special sharps container. Do not put them in a trash can. If you do not have a sharps container, call your pharmacist or healthcare provider to get one. Talk to your pediatrician regarding the use of this medicine in children. Special care may be needed. Overdosage: If you think you have taken too much of this medicine contact a poison control center or emergency room at once. NOTE: This medicine is only for you. Do not share this medicine with others. What if I miss a dose? If you miss a dose, take it as soon as you can. If it is almost time for your  next dose, take only that dose. Do not take double or extra doses. What may interact with this medicine?  bromocriptine  certain medicines for blood pressure, heart disease, irregular heartbeat  cyclosporine  diuretics  medicines for diabetes, including insulin  quinidine This list may not describe all possible interactions. Give your health care provider a list of all the medicines, herbs, non-prescription drugs, or dietary supplements you use. Also tell them if you smoke, drink alcohol, or use illegal drugs. Some items may interact with your medicine. What should I watch for while using this medicine? Visit your doctor or health care professional for regular checks on your progress. To help reduce irritation at the injection site, use a different site for each injection and make sure the solution is at room temperature before use. This medicine may cause decreases in blood sugar. Signs of low blood sugar include chills, cool, pale skin or cold sweats, drowsiness, extreme hunger, fast heartbeat, headache, nausea, nervousness or anxiety, shakiness, trembling, unsteadiness, tiredness, or weakness. Contact your doctor or health care professional right away if you experience any of these symptoms. This medicine may increase blood sugar. Ask your healthcare provider if changes in diet or medicines are needed if you have diabetes. This medicine may cause a decrease in vitamin B12. You should make sure that you get enough vitamin B12 while you are taking this medicine. Discuss the foods you eat and the vitamins you take with your health care professional. What side effects may I notice from receiving this medicine? Side   effects that you should report to your doctor or health care professional as soon as possible:  allergic reactions like skin rash, itching or hives, swelling of the face, lips, or tongue  fast, slow, or irregular heartbeat  right upper belly pain  severe stomach pain  signs  and symptoms of high blood sugar such as being more thirsty or hungry or having to urinate more than normal. You may also feel very tired or have blurry vision.  signs and symptoms of low blood sugar such as feeling anxious; confusion; dizziness; increased hunger; unusually weak or tired; increased sweating; shakiness; cold, clammy skin; irritable; headache; blurred vision; fast heartbeat; loss of consciousness  unusually weak or tired Side effects that usually do not require medical attention (report to your doctor or health care professional if they continue or are bothersome):  diarrhea  dizziness  gas  headache  nausea, vomiting  pain, redness, or irritation at site where injected  upset stomach This list may not describe all possible side effects. Call your doctor for medical advice about side effects. You may report side effects to FDA at 1-800-FDA-1088. Where should I keep my medicine? Keep out of the reach of children. Store in a refrigerator between 2 and 8 degrees C (36 and 46 degrees F). Protect from light. Allow to come to room temperature naturally. Do not use artificial heat. If protected from light, the injection may be stored at room temperature between 20 and 30 degrees C (70 and 86 degrees F) for 14 days. After the initial use, throw away any unused portion of a multiple dose vial after 14 days. Throw away unused portions of the ampules after use. NOTE: This sheet is a summary. It may not cover all possible information. If you have questions about this medicine, talk to your doctor, pharmacist, or health care provider.  2020 Elsevier/Gold Standard (2019-06-20 13:33:09) Denosumab injection What is this medicine? DENOSUMAB (den oh sue mab) slows bone breakdown. Prolia is used to treat osteoporosis in women after menopause and in men, and in people who are taking corticosteroids for 6 months or more. Delton See is used to treat a high calcium level due to cancer and to prevent  bone fractures and other bone problems caused by multiple myeloma or cancer bone metastases. Delton See is also used to treat giant cell tumor of the bone. This medicine may be used for other purposes; ask your health care provider or pharmacist if you have questions. COMMON BRAND NAME(S): Prolia, XGEVA What should I tell my health care provider before I take this medicine? They need to know if you have any of these conditions:  dental disease  having surgery or tooth extraction  infection  kidney disease  low levels of calcium or Vitamin D in the blood  malnutrition  on hemodialysis  skin conditions or sensitivity  thyroid or parathyroid disease  an unusual reaction to denosumab, other medicines, foods, dyes, or preservatives  pregnant or trying to get pregnant  breast-feeding How should I use this medicine? This medicine is for injection under the skin. It is given by a health care professional in a hospital or clinic setting. A special MedGuide will be given to you before each treatment. Be sure to read this information carefully each time. For Prolia, talk to your pediatrician regarding the use of this medicine in children. Special care may be needed. For Delton See, talk to your pediatrician regarding the use of this medicine in children. While this drug may be prescribed  for children as young as 13 years for selected conditions, precautions do apply. Overdosage: If you think you have taken too much of this medicine contact a poison control center or emergency room at once. NOTE: This medicine is only for you. Do not share this medicine with others. What if I miss a dose? It is important not to miss your dose. Call your doctor or health care professional if you are unable to keep an appointment. What may interact with this medicine? Do not take this medicine with any of the following medications:  other medicines containing denosumab This medicine may also interact with the  following medications:  medicines that lower your chance of fighting infection  steroid medicines like prednisone or cortisone This list may not describe all possible interactions. Give your health care provider a list of all the medicines, herbs, non-prescription drugs, or dietary supplements you use. Also tell them if you smoke, drink alcohol, or use illegal drugs. Some items may interact with your medicine. What should I watch for while using this medicine? Visit your doctor or health care professional for regular checks on your progress. Your doctor or health care professional may order blood tests and other tests to see how you are doing. Call your doctor or health care professional for advice if you get a fever, chills or sore throat, or other symptoms of a cold or flu. Do not treat yourself. This drug may decrease your body's ability to fight infection. Try to avoid being around people who are sick. You should make sure you get enough calcium and vitamin D while you are taking this medicine, unless your doctor tells you not to. Discuss the foods you eat and the vitamins you take with your health care professional. See your dentist regularly. Brush and floss your teeth as directed. Before you have any dental work done, tell your dentist you are receiving this medicine. Do not become pregnant while taking this medicine or for 5 months after stopping it. Talk with your doctor or health care professional about your birth control options while taking this medicine. Women should inform their doctor if they wish to become pregnant or think they might be pregnant. There is a potential for serious side effects to an unborn child. Talk to your health care professional or pharmacist for more information. What side effects may I notice from receiving this medicine? Side effects that you should report to your doctor or health care professional as soon as possible:  allergic reactions like skin rash, itching  or hives, swelling of the face, lips, or tongue  bone pain  breathing problems  dizziness  jaw pain, especially after dental work  redness, blistering, peeling of the skin  signs and symptoms of infection like fever or chills; cough; sore throat; pain or trouble passing urine  signs of low calcium like fast heartbeat, muscle cramps or muscle pain; pain, tingling, numbness in the hands or feet; seizures  unusual bleeding or bruising  unusually weak or tired Side effects that usually do not require medical attention (report to your doctor or health care professional if they continue or are bothersome):  constipation  diarrhea  headache  joint pain  loss of appetite  muscle pain  runny nose  tiredness  upset stomach This list may not describe all possible side effects. Call your doctor for medical advice about side effects. You may report side effects to FDA at 1-800-FDA-1088. Where should I keep my medicine? This medicine is only given  in a clinic, doctor's office, or other health care setting and will not be stored at home. NOTE: This sheet is a summary. It may not cover all possible information. If you have questions about this medicine, talk to your doctor, pharmacist, or health care provider.  2020 Elsevier/Gold Standard (2018-03-30 16:10:44)

## 2020-12-13 LAB — FOLATE RBC
Folate, Hemolysate: 333 ng/mL
Folate, RBC: 928 ng/mL (ref >498)
Hematocrit: 35.9 % (ref 34.0–46.6)

## 2021-01-07 NOTE — Progress Notes (Signed)
HEMATOLOGY ONCOLOGY PROGRESS NOTE  Date of service:  01/07/21    Patient Care Team: [name], PA-C as PCP - General (Physician Assistant) [name], MD as Consulting Physician (Hematology and Oncology)  CHIEF COMPLAINTS/PURPOSE OF CONSULTATION:  F/u for metastatic lung cancer and esophageal cancer  DIAGNOSIS:   #1 Metastatic non-small cell lung cancer with bilateral lung nodules and large metastatic lesion in the left Ilium. 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 Also had c diff colitis - treated   Current Treatment  1) Active surveillance 2) Xgeva 132m [place] q12weeks for bone metastases. 3) Sandostatin q4weeks for diarrhea   Previous Treatment  For metastatic lung cancer 1 Palliative radiation therapy to the large left ilium metastases 2. IV Nivolumab x 20 cycles (discontinued due to likely immune colitis) 3. Xgeva 1285mSC q4weeks for bone metastases.  For Esophageal adenocarcinoma S/p concurrent carbo/taxol + RT  HISTORY OF PRESENTING ILLNESS: (plz see my previous consultation for details of initial presentation)  INTERVAL HISTORY:   Ms ToWirtzs presenting today for her scheduled follow-up for metastatic lung cancer, and adenocarcinoma of the esophagus. The patient's last visit with [place] on 11/13/2020. The pt reports that she is doing well overall.  The pt reports no new symptoms or concerns. She has been staying active by working in the yard and painting. She notes she is now living by herself and more stress-free. The pt has received her COVID vaccines and booster.  The pt notes that she is tolerating her treatment very well.  Lab results today 01/08/2021 of CBC w/diff and CMP is as follows: all values are WNL except for Hgb of 11.6, Glucose of 156, Albumin of 3.3.  01/08/2021 Iron saturation 37% 01/08/2021 Ferritin is 58 01/08/2021 Vitamin B12 is 566 01/08/2021 Folate RBC is  1315  On review of systems, pt reports eye watering and denies swallowing issues, weight loss, SOB, cough, abdominal pain, acute specific bone pain, diarrhea, back pain and any other symptoms.  MEDICAL HISTORY:  Past Medical History:  Diagnosis Date  . Barrett's esophagus   . Bone neoplasm 06/24/2015  [facility]   metastatic poorly differentiated carcinoma. tumor left groin surgical removal with radiation tx.  . Cataract    BILATERAL  . Cigarette smoker two packs a day or less    Currently still smoking 2 PPD - Not interested in quitting at this time.  . Colitis 2017  . Colon polyps    hyperplastic, tubular adenomas, tubulovillous adenoma  . Cough, persistent    hx. lung cancer ? primary-being evaluated, unsure of primary site.  . Depression 06/24/2015  . Diverticulosis   . [place]  . Endometriosis    Hysterectomy with BSO at age [age]rs  . Esophageal adenocarcinoma ([facility]   intramucosal  . Gastritis   . GERD (gastroesophageal reflux disease)   . H/O: pneumonia   . Hiatal hernia   . Hyperlipidemia   . Hypertension 06/24/2015   likely improved incidental to 40 lbs weight loss from her neoplasm. No Longer taking med for this as of 08-06-15  . IBS (irritable bowel syndrome)   . Pain    left hip-persistent"tumor of bone"-radiation tx. 10.  . Vitamin D deficiency disease    SURGICAL HISTORY: Past Surgical History:  Procedure Laterality Date  . ABDOMINAL HYSTERECTOMY    . BALLOON DILATION N/A 10/08/2019   Procedure: BALLOON DILATION;  Surgeon: [name], MD;  Location: [facility];  Service: Gastroenterology;  Laterality: N/A;  . BARTHOLIN GLAND CYST EXCISION  80 yo ago   Does not want if it was an infected cyst or tumor. Was soon as delivery  . BIOPSY  01/02/2019   Procedure: BIOPSY;  Surgeon: [name], MD;  Location: [facility];  Service: Gastroenterology;;  . CATARACT EXTRACTION    . COLONOSCOPY W/ POLYPECTOMY     multiple times - last  done 09/2014 per patient.  . ESOPHAGOGASTRODUODENOSCOPY (EGD) WITH PROPOFOL N/A 08/11/2015   Procedure: ESOPHAGOGASTRODUODENOSCOPY (EGD) WITH PROPOFOL;  Surgeon: [name], MD;  Location: [facility];  Service: Gastroenterology;  Laterality: N/A;  . ESOPHAGOGASTRODUODENOSCOPY (EGD) WITH PROPOFOL N/A 01/02/2019   Procedure: ESOPHAGOGASTRODUODENOSCOPY (EGD) WITH PROPOFOL;  Surgeon: [name], MD;  Location: [facility];  Service: Gastroenterology;  Laterality: N/A;  . ESOPHAGOGASTRODUODENOSCOPY (EGD) WITH PROPOFOL N/A 10/08/2019   Procedure: ESOPHAGOGASTRODUODENOSCOPY (EGD) WITH PROPOFOL;  Surgeon: [name], MD;  Location: [facility];  Service: Gastroenterology;  Laterality: N/A;  . FLEXIBLE SIGMOIDOSCOPY N/A 06/24/2017   Procedure: FLEXIBLE SIGMOIDOSCOPY;  Surgeon: Manus Gunning, MD;  Location: [facility];  Service: Gastroenterology;  Laterality: N/A;  . GANGLION CYST EXCISION    . KNEE ARTHROSCOPY  age about 43 yrs  . TONSILLECTOMY    . TOTAL ABDOMINAL HYSTERECTOMY W/ BILATERAL SALPINGOOPHORECTOMY  at age 13 yrs   For endometriosis    SOCIAL HISTORY: Social History   Socioeconomic History  . Marital status: Widowed    Spouse name: Not on file  . Number of children: 2  . Years of education: Not on file  . Highest education level: Not on file  Occupational History  . Not on file  Tobacco Use  . Smoking status: Former Smoker    Packs/day: 1.00    Years: 60.00    Pack years: 60.00    Types: Cigarettes    Quit date: 12/05/2014    Years since quitting: 6.0  . Smokeless tobacco: Never Used  Vaping Use  . Vaping Use: Never used  Substance and Sexual Activity  . Alcohol use: No    Alcohol/week: 0.0 standard drinks  . Drug use: No  . Sexual activity: Not Currently  Other Topics Concern  . Not on file  Social History Narrative  . Not on file   Social Determinants of Health   Financial Resource Strain: Not on file  Food Insecurity: Not on file   Transportation Needs: Not on file  Physical Activity: Not on file  Stress: Not on file  Social Connections: Not on file  Intimate Partner Violence: Not on file    FAMILY HISTORY: Family History  Problem Relation Age of Onset  . Colon cancer Brother   . Colon cancer Brother   . Stroke Mother   . Colon cancer Father   . Emphysema Father        smoked  . Breast cancer Daughter 37       ER/PR+ stage II    ALLERGIES:  is allergic to penicillins, remeron [mirtazapine], and latex. patient wonders if she has a penicillin allergy but notes that she is uncertain about this.  MEDICATIONS:  Current Outpatient Medications  Medication Sig Dispense Refill  . amLODipine (NORVASC) 5 MG tablet TAKE 1 TABLET BY MOUTH EVERY DAY 90 tablet 1  . Biotin 5 MG CAPS Take 5 mg by mouth daily.    . busPIRone (BUSPAR) 10 MG tablet Take 1 tablet (10  mg total) by mouth 3 (three) times daily. 270 tablet 0  . Calcium Citrate-Vitamin D (CALCIUM + D PO) Take 1 tablet by mouth daily.    . Cyanocobalamin (B-12) 2500 MCG TABS Take 2,500 mcg by mouth daily.    [name] doxycycline (VIBRA-TABS) 100 MG tablet Take 1 tablet (100 mg total) by mouth 2 (two) times daily. 20 tablet 0  . FLUoxetine (PROZAC) 40 MG capsule TAKE 1 CAPSULE BY MOUTH EVERY DAY 90 capsule 1  . omeprazole (PRILOSEC) 40 MG capsule TAKE 1 CAPSULE BY MOUTH EVERY DAY BEFORE BREAKFAST 90 capsule 1  . traZODone (DESYREL) 50 MG tablet TAKE 1 TABLET BY MOUTH EVERYDAY AT BEDTIME 90 tablet 2  . Vitamin D, Ergocalciferol, (DRISDOL) 1.25 MG (50000 UNIT) CAPS capsule TAKE 1 CAPSULE BY MOUTH EVERY 7 DAYS 12 capsule 3  . XARELTO 20 MG TABS tablet TAKE 1 TABLET (20 MG TOTAL) BY MOUTH DAILY WITH SUPPER. 90 tablet 0   No current facility-administered medications for this visit.    REVIEW OF SYSTEMS:   10 Point review of Systems was done is negative except as noted above.  PHYSICAL EXAMINATION: ECOG PERFORMANCE STATUS: 2 - Symptomatic, <50% confined to bed  Vitals:    01/08/21 1037  BP: (!) 168/77  Pulse: 71  Resp: 16  Temp: 97.7 F (36.5 C)  SpO2: 98%   Filed Weights   01/08/21 1037  Weight: 144 lb 8 oz (65.5 kg)  .  Wt Readings from Last 3 Encounters:  01/08/21 144 lb 8 oz (65.5 kg)  11/18/20 136 lb 9.6 oz (62 kg)  11/13/20 132 lb (59.9 kg)    GENERAL:alert, in no acute distress and comfortable SKIN: no acute rashes, no significant lesions EYES: conjunctiva are pink and non-injected, sclera anicteric OROPHARYNX: MMM, no exudates, no oropharyngeal erythema or ulceration NECK: supple, no JVD LYMPH:  no palpable lymphadenopathy in the cervical, axillary or inguinal regions LUNGS: clear to auscultation b/l with normal respiratory effort HEART: regular rate & rhythm ABDOMEN:  normoactive bowel sounds , non tender, not distended. Extremity: no pedal edema PSYCH: alert & oriented x 3 with fluent speech NEURO: no focal motor/sensory deficits  LABORATORY DATA:  I have reviewed the data as listed  . CBC Latest Ref Rng & Units 01/08/2021 01/08/2021 12/11/2020  WBC 4.0 - 10.5 K/uL 5.8 - 6.5  Hemoglobin 12.0 - 15.0 g/dL 11.6(L) - 11.4(L)  Hematocrit 34.0 - 46.6 % 36.3 35.5 36.5  Platelets 150 - 400 K/uL 187 - 169   ANC 1.8k . CMP Latest Ref Rng & Units 01/08/2021 12/11/2020 10/16/2020  Glucose 70 - 99 mg/dL 156(H) 76 90  BUN 8 - 23 mg/dL 15 16 19   Creatinine 0.44 - 1.00 mg/dL 0.94 0.88 1.07(H)  Sodium 135 - 145 mmol/L 139 139 139  Potassium 3.5 - 5.1 mmol/L 4.0 4.2 5.1  Chloride 98 - 111 mmol/L 107 108 106  CO2 22 - 32 mmol/L 23 24 27   Calcium 8.9 - 10.3 mg/dL 9.0 8.9 9.1  Total Protein 6.5 - 8.1 g/dL 7.3 7.3 7.4  Total Bilirubin 0.3 - 1.2 mg/dL 0.3 0.4 0.5  Alkaline Phos 38 - 126 U/L 55 60 62  AST 15 - 41 U/L 22 20 21   ALT 0 - 44 U/L 9 8 7    Component     Latest Ref Rng & Units 01/08/2021  Iron     41 - 142 ug/dL 85  TIBC     236 - 444 ug/dL 233 (L)  Saturation Ratios     21 - 57 % 37  UIBC     120 - 384 ug/dL 148  Folate,  Hemolysate     Not Estab. ng/mL 467.0  HCT     34.0 - 46.6 % 35.5  Folate, RBC     >498 ng/mL 1,315  Vitamin B12     180 - 914 pg/mL 566  Ferritin     11 - 307 ng/mL 58       01/02/19 Esophagus Biopsy:    RADIOGRAPHIC STUDIES:  .No results found.  ASSESSMENT & PLAN:   80 y.o. female with  #1 Metastatic poorly differentiated carcinoma with likely lung primary non-small cell lung cancer.   CT of the head with and without contrast showed no evidence of metastatic disease. EGFR blood test mutation analysis negative. CT chest abdomen pelvis 04/19/2016 shows no evidence of disease progression. Patient tolerated Nivolumab very well but was discontinued when she developed grade 2 Immune colitis. Has been off Nivolumab for >6 months  CT chest abdomen pelvis on 06/24/2016 shows no evidence of new disease or progression of metastatic disease. CT chest abdomen pelvis 09/06/2016 shows 1. Mixed interval response to therapy. 2. There is a new left ventral chest wall lesion deep to the pectoralis musculature worrisome for metastatic disease. 3. Posterior lower lobe nodular densities are identified which may reflect areas of pulmonary metastasis. 4. Interval decrease in size of destructive lesion involving the left iliac bone.  CT chest abd pelvis 12/08/2016: Cystic mass involving the left ventral chest wall has resolved in the interval. Likely was a hematoma due to trauma. Interval increase in size of pleural base mass overlying the posterior and inferior left lower lobe. There is also a new left pleural effusion identified.  CT chest 02/01/2017: Residual irregular soft tissue thickening/volume loss and trace left pleural fluid at the base of the left hemithorax, overall improved in appearance from 12/08/2016. No measurable lesion.  CT chest 05/29/2017 shows no residual pleural based mass or significant pleural effusion in the left hemithorax. No evidence of thoracic metastatic disease. No evidence  of progressive metastatic disease within the abdomen or pelvis. Mixed lytic and blastic lesion involving the left iliac bone and associated pathologic fracture are unchanged.   CT CAP 09/14/17 shows no new changes. She does have slight displacement of her fractured left iliac bone. Evidence of stable disease.   CT CAP 01/04/2018- No new or progressive metastatic disease. Stable large left iliac bone metastasis with associated chronic pathologic fracture.   CT chest/abd/pelvis done on 04/26/18 revealed Stable exam.  No new or progressive interval findings.  07/19/18 CT C/A/P revealed Stable exam.  No new or progressive interval findings. Large destructive left iliac lesion is similar to prior. Aortic Atherosclerosis and Emphysema.    11/06/18 CT C/A/P revealed Similar appearance of large mixed lytic and sclerotic lesion in the left ilium. No new metastatic lesions are otherwise noted elsewhere in the chest, abdomen or pelvis. 2. Interval development of thickening of the distal third of the esophagus. This is nonspecific, and could be related to underlying reflux esophagitis. However, if there is any clinical concern for Barrett's metaplasia or esophageal neoplasia, further evaluation with nonemergent endoscopy could be considered. 3. Aortic atherosclerosis, in addition to left main coronary artery disease. Assessment for potential risk factor modification, dietary therapy or pharmacologic therapy may be warranted, if clinically Indicated. 4. Diffuse bronchial wall thickening with mild to moderate centrilobular and paraseptal emphysema; imaging findings suggestive of underlying  COPD. 5. Additional incidental findings, as above.  #2  Adenocarcinoma of the Esophagus  Barrett's esophagus 4cms in the distal esophagus with low and high-grade dysplasia  01/02/19 Surgical pathology revealed adenocarcinoma of the esophagus   01/25/19 PET/CT revealed Distal esophageal primary, without hypermetabolic metastatic  disease. 2. Chronic left iliac metastasis, as before. 3. Hypermetabolism within and superficial to the right gluteal musculature is most likely related to trauma and/or injection sites. 4. Aortic atherosclerosis, coronary artery atherosclerosis and emphysema.  S/p concurrent Carboplatin and Taxol weekly with RT of 45 Gy in 25 fractions and 5.4 Gy boost, completed between 02/04/19 and 03/27/19  07/03/2019 PET skull base to thigh revealed "1. Interval response to therapy. Significant reduction in FDG uptake associated with distal esophageal mass. SUV max currently 2.61 versus 16.97 previously. 2. 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 Unchanged 3. Aortic Atherosclerosis (ICD10-I70.0) and Emphysema (ICD10-J43.9). Coronary artery calcifications."  07/15/2020 CT C/A/P ([id number]) ([id number]) revealed "1. No evidence of new or progressive metastatic disease in the chest, abdomen or pelvis."  #3 diarrhea-  now resolved was previously. 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 She was previously on on Lialda, budesonide,probiotics and lomotil but not currently taking any of these. Plan -Continue Sandostatin every 4 weeks   #4 h/o DVT and PE  -continue on Xarelto - no issues with bleeding   #6 Dyspnea - resolved 03/14/19 ECHO revealed LV EF of 60-65% 03/06/19 CXR revealed clear lung fields, normal heart size 03/07/19 and 02/25/19 EKGs, no overt concern but some decreased QRS amplitude Did refer to pulmonology for further evaluation and lung function testing  Began steroid inhaler to mitigate possible inflammation in airway, could be some radiation related scarring and emphysema   PLAN: -Discussed pt labwork today, 01/08/2021; blood counts and chemistries stable. -The pt shows no lab or clinical evidence of Metastatic Lung Cancer or Esophageal Adenocarcinoma at this  time. -Will continue Sandostatin q4weeks and Xgeva q12weeks. The pt has no prohibitive toxicities. -Advised pt she can explore if Eliquis is cheaper under her insurance plan. -Continue daily multivitamin.  -Continue smoking cessation and yogurt for gut health. -Continue PO Magnesium replacement  -Continue 50K IU Vitamin D weekly -Will see back in 3 months with labs. Will get repeat scans prior to visit.  FOLLOW UP: -continue Sandostatin q4weeks plz schedule next 4 doses -change Xgeva to every 12 weeks with labs - plz schedule next 3 doses. -portflush with each lab -CT chest/abd/pelvis in 7 weeks -MD visit in 2 months with portflush and labs   The total time spent in the appointment was 30 minutes and more than 50% was on counseling and direct patient cares.  All of the patient's questions were answered with apparent satisfaction. The patient knows to call the clinic with any problems, questions or concerns.   Sullivan Lone MD [place] [facility]/Oncology Physician [facility]  (Office): [phone] (Work cell): [phone] (Fax): [phone]  I, Reinaldo Raddle, am acting as scribe for Dr. Sullivan Lone, MD.     .I have reviewed the above documentation for accuracy and completeness, and I agree with the above. [name] MD

## 2021-01-08 ENCOUNTER — Other Ambulatory Visit: Payer: Medicare Other

## 2021-01-08 ENCOUNTER — Inpatient Hospital Stay: Payer: Medicare Other

## 2021-01-08 ENCOUNTER — Other Ambulatory Visit: Payer: Self-pay

## 2021-01-08 ENCOUNTER — Inpatient Hospital Stay (HOSPITAL_BASED_OUTPATIENT_CLINIC_OR_DEPARTMENT_OTHER): Payer: Medicare Other | Admitting: Hematology

## 2021-01-08 ENCOUNTER — Inpatient Hospital Stay: Payer: Medicare Other | Attending: Hematology

## 2021-01-08 ENCOUNTER — Ambulatory Visit: Payer: Medicare Other

## 2021-01-08 VITALS — BP 168/77 | HR 71 | Temp 97.7°F | Resp 16 | Ht 68.0 in | Wt 144.5 lb

## 2021-01-08 DIAGNOSIS — C3491 Malignant neoplasm of unspecified part of right bronchus or lung: Secondary | ICD-10-CM

## 2021-01-08 DIAGNOSIS — C155 Malignant neoplasm of lower third of esophagus: Secondary | ICD-10-CM

## 2021-01-08 DIAGNOSIS — C7951 Secondary malignant neoplasm of bone: Secondary | ICD-10-CM | POA: Diagnosis not present

## 2021-01-08 DIAGNOSIS — R197 Diarrhea, unspecified: Secondary | ICD-10-CM

## 2021-01-08 DIAGNOSIS — T451X5D Adverse effect of antineoplastic and immunosuppressive drugs, subsequent encounter: Secondary | ICD-10-CM | POA: Insufficient documentation

## 2021-01-08 DIAGNOSIS — Z86711 Personal history of pulmonary embolism: Secondary | ICD-10-CM | POA: Diagnosis not present

## 2021-01-08 DIAGNOSIS — Z86718 Personal history of other venous thrombosis and embolism: Secondary | ICD-10-CM | POA: Insufficient documentation

## 2021-01-08 DIAGNOSIS — E559 Vitamin D deficiency, unspecified: Secondary | ICD-10-CM | POA: Insufficient documentation

## 2021-01-08 DIAGNOSIS — Z95828 Presence of other vascular implants and grafts: Secondary | ICD-10-CM

## 2021-01-08 DIAGNOSIS — Z923 Personal history of irradiation: Secondary | ICD-10-CM | POA: Diagnosis not present

## 2021-01-08 DIAGNOSIS — Z7189 Other specified counseling: Secondary | ICD-10-CM

## 2021-01-08 DIAGNOSIS — Z90722 Acquired absence of ovaries, bilateral: Secondary | ICD-10-CM | POA: Insufficient documentation

## 2021-01-08 DIAGNOSIS — I10 Essential (primary) hypertension: Secondary | ICD-10-CM | POA: Diagnosis not present

## 2021-01-08 DIAGNOSIS — E785 Hyperlipidemia, unspecified: Secondary | ICD-10-CM | POA: Diagnosis not present

## 2021-01-08 DIAGNOSIS — C3490 Malignant neoplasm of unspecified part of unspecified bronchus or lung: Secondary | ICD-10-CM

## 2021-01-08 DIAGNOSIS — Z8501 Personal history of malignant neoplasm of esophagus: Secondary | ICD-10-CM | POA: Insufficient documentation

## 2021-01-08 DIAGNOSIS — Z90710 Acquired absence of both cervix and uterus: Secondary | ICD-10-CM | POA: Insufficient documentation

## 2021-01-08 DIAGNOSIS — F17210 Nicotine dependence, cigarettes, uncomplicated: Secondary | ICD-10-CM | POA: Diagnosis not present

## 2021-01-08 DIAGNOSIS — Z9079 Acquired absence of other genital organ(s): Secondary | ICD-10-CM | POA: Insufficient documentation

## 2021-01-08 DIAGNOSIS — Z79899 Other long term (current) drug therapy: Secondary | ICD-10-CM | POA: Insufficient documentation

## 2021-01-08 DIAGNOSIS — K529 Noninfective gastroenteritis and colitis, unspecified: Secondary | ICD-10-CM

## 2021-01-08 DIAGNOSIS — K521 Toxic gastroenteritis and colitis: Secondary | ICD-10-CM | POA: Insufficient documentation

## 2021-01-08 DIAGNOSIS — Z9221 Personal history of antineoplastic chemotherapy: Secondary | ICD-10-CM | POA: Insufficient documentation

## 2021-01-08 DIAGNOSIS — Z7901 Long term (current) use of anticoagulants: Secondary | ICD-10-CM | POA: Insufficient documentation

## 2021-01-08 LAB — CMP (CANCER CENTER ONLY)
ALT: 9 U/L (ref 0–44)
AST: 22 U/L (ref 15–41)
Albumin: 3.3 g/dL — ABNORMAL LOW (ref 3.5–5.0)
Alkaline Phosphatase: 55 U/L (ref 38–126)
Anion gap: 9 (ref 5–15)
BUN: 15 mg/dL (ref 8–23)
CO2: 23 mmol/L (ref 22–32)
Calcium: 9 mg/dL (ref 8.9–10.3)
Chloride: 107 mmol/L (ref 98–111)
Creatinine: 0.94 mg/dL (ref 0.44–1.00)
GFR, Estimated: >60 mL/min (ref >60)
Glucose, Bld: 156 mg/dL — ABNORMAL HIGH (ref 70–99)
Potassium: 4 mmol/L (ref 3.5–5.1)
Sodium: 139 mmol/L (ref 135–145)
Total Bilirubin: 0.3 mg/dL (ref 0.3–1.2)
Total Protein: 7.3 g/dL (ref 6.5–8.1)

## 2021-01-08 LAB — CBC WITH DIFFERENTIAL/PLATELET
Abs Immature Granulocytes: 0.02 10*3/uL (ref 0.00–0.07)
Basophils Absolute: 0.1 10*3/uL (ref 0.0–0.1)
Basophils Relative: 1 %
Eosinophils Absolute: 0.2 10*3/uL (ref 0.0–0.5)
Eosinophils Relative: 3 %
HCT: 36.3 % (ref 36.0–46.0)
Hemoglobin: 11.6 g/dL — ABNORMAL LOW (ref 12.0–15.0)
Immature Granulocytes: 0 %
Lymphocytes Relative: 22 %
Lymphs Abs: 1.3 10*3/uL (ref 0.7–4.0)
MCH: 29.6 pg (ref 26.0–34.0)
MCHC: 32 g/dL (ref 30.0–36.0)
MCV: 92.6 fL (ref 80.0–100.0)
Monocytes Absolute: 0.5 10*3/uL (ref 0.1–1.0)
Monocytes Relative: 9 %
Neutro Abs: 3.7 10*3/uL (ref 1.7–7.7)
Neutrophils Relative %: 65 %
Platelets: 187 10*3/uL (ref 150–400)
RBC: 3.92 MIL/uL (ref 3.87–5.11)
RDW: 14.6 % (ref 11.5–15.5)
WBC: 5.8 10*3/uL (ref 4.0–10.5)
nRBC: 0 % (ref 0.0–0.2)

## 2021-01-08 LAB — IRON AND TIBC
Iron: 85 ug/dL (ref 41–142)
Saturation Ratios: 37 % (ref 21–57)
TIBC: 233 ug/dL — ABNORMAL LOW (ref 236–444)
UIBC: 148 ug/dL (ref 120–384)

## 2021-01-08 LAB — FERRITIN: Ferritin: 58 ng/mL (ref 11–307)

## 2021-01-08 LAB — VITAMIN B12: Vitamin B-12: 566 pg/mL (ref 180–914)

## 2021-01-08 MED ORDER — HEPARIN SOD (PORK) LOCK FLUSH 100 UNIT/ML IV SOLN
500.0000 [IU] | Freq: Once | INTRAVENOUS | Status: AC
  Administered 2021-01-08: 500 [IU] via INTRAVENOUS
  Filled 2021-01-08: qty 5

## 2021-01-08 MED ORDER — SODIUM CHLORIDE 0.9% FLUSH
10.0000 mL | INTRAVENOUS | Status: DC | PRN
  Administered 2021-01-08: 10 mL via INTRAVENOUS
  Filled 2021-01-08: qty 10

## 2021-01-08 MED ORDER — OCTREOTIDE ACETATE 30 MG IM KIT
30.0000 mg | PACK | Freq: Once | INTRAMUSCULAR | Status: AC
  Administered 2021-01-08: 30 mg via INTRAMUSCULAR

## 2021-01-08 MED ORDER — OCTREOTIDE ACETATE 30 MG IM KIT
PACK | INTRAMUSCULAR | Status: AC
  Filled 2021-01-08: qty 1

## 2021-01-08 NOTE — Patient Instructions (Signed)

## 2021-01-08 NOTE — Patient Instructions (Signed)
Octreotide injection solution What is this medicine? OCTREOTIDE (ok TREE oh tide) is used to reduce blood levels of growth hormone in patients with a condition called acromegaly. This medicine also reduces flushing and watery diarrhea caused by certain types of cancer. This medicine may be used for other purposes; ask your health care provider or pharmacist if you have questions. COMMON BRAND NAME(S): Bynfezia, Sandostatin What should I tell my health care provider before I take this medicine? They need to know if you have any of these conditions:  diabetes  gallbladder disease  kidney disease  liver disease  thyroid disease  an unusual or allergic reaction to octreotide, other medicines, foods, dyes, or preservatives  pregnant or trying to get pregnant  breast-feeding How should I use this medicine? This medicine is for injection under the skin or into a vein (only in emergency situations). It is usually given by a health care professional in a hospital or clinic setting. If you get this medicine at home, you will be taught how to prepare and give this medicine. Allow the injection solution to come to room temperature before use. Do not warm it artificially. Use exactly as directed. Take your medicine at regular intervals. Do not take your medicine more often than directed. It is important that you put your used needles and syringes in a special sharps container. Do not put them in a trash can. If you do not have a sharps container, call your pharmacist or healthcare provider to get one. Talk to your pediatrician regarding the use of this medicine in children. Special care may be needed. Overdosage: If you think you have taken too much of this medicine contact a poison control center or emergency room at once. NOTE: This medicine is only for you. Do not share this medicine with others. What if I miss a dose? If you miss a dose, take it as soon as you can. If it is almost time for your  next dose, take only that dose. Do not take double or extra doses. What may interact with this medicine?  bromocriptine  certain medicines for blood pressure, heart disease, irregular heartbeat  cyclosporine  diuretics  medicines for diabetes, including insulin  quinidine This list may not describe all possible interactions. Give your health care provider a list of all the medicines, herbs, non-prescription drugs, or dietary supplements you use. Also tell them if you smoke, drink alcohol, or use illegal drugs. Some items may interact with your medicine. What should I watch for while using this medicine? Visit your doctor or health care professional for regular checks on your progress. To help reduce irritation at the injection site, use a different site for each injection and make sure the solution is at room temperature before use. This medicine may cause decreases in blood sugar. Signs of low blood sugar include chills, cool, pale skin or cold sweats, drowsiness, extreme hunger, fast heartbeat, headache, nausea, nervousness or anxiety, shakiness, trembling, unsteadiness, tiredness, or weakness. Contact your doctor or health care professional right away if you experience any of these symptoms. This medicine may increase blood sugar. Ask your healthcare provider if changes in diet or medicines are needed if you have diabetes. This medicine may cause a decrease in vitamin B12. You should make sure that you get enough vitamin B12 while you are taking this medicine. Discuss the foods you eat and the vitamins you take with your health care professional. What side effects may I notice from receiving this medicine? Side   effects that you should report to your doctor or health care professional as soon as possible:  allergic reactions like skin rash, itching or hives, swelling of the face, lips, or tongue  fast, slow, or irregular heartbeat  right upper belly pain  severe stomach pain  signs  and symptoms of high blood sugar such as being more thirsty or hungry or having to urinate more than normal. You may also feel very tired or have blurry vision.  signs and symptoms of low blood sugar such as feeling anxious; confusion; dizziness; increased hunger; unusually weak or tired; increased sweating; shakiness; cold, clammy skin; irritable; headache; blurred vision; fast heartbeat; loss of consciousness  unusually weak or tired Side effects that usually do not require medical attention (report to your doctor or health care professional if they continue or are bothersome):  diarrhea  dizziness  gas  headache  nausea, vomiting  pain, redness, or irritation at site where injected  upset stomach This list may not describe all possible side effects. Call your doctor for medical advice about side effects. You may report side effects to FDA at 1-800-FDA-1088. Where should I keep my medicine? Keep out of the reach of children. Store in a refrigerator between 2 and 8 degrees C (36 and 46 degrees F). Protect from light. Allow to come to room temperature naturally. Do not use artificial heat. If protected from light, the injection may be stored at room temperature between 20 and 30 degrees C (70 and 86 degrees F) for 14 days. After the initial use, throw away any unused portion of a multiple dose vial after 14 days. Throw away unused portions of the ampules after use. NOTE: This sheet is a summary. It may not cover all possible information. If you have questions about this medicine, talk to your doctor, pharmacist, or health care provider.  2021 Elsevier/Gold Standard (2019-06-20 13:33:09)  

## 2021-01-10 LAB — FOLATE RBC
Folate, Hemolysate: 467 ng/mL
Folate, RBC: 1315 ng/mL
Hematocrit: 35.5 % (ref 34.0–46.6)

## 2021-02-03 ENCOUNTER — Other Ambulatory Visit: Payer: Self-pay | Admitting: *Deleted

## 2021-02-03 DIAGNOSIS — C155 Malignant neoplasm of lower third of esophagus: Secondary | ICD-10-CM

## 2021-02-04 ENCOUNTER — Other Ambulatory Visit: Payer: Self-pay | Admitting: Hematology

## 2021-02-04 NOTE — Telephone Encounter (Signed)
Please review for refill thank you. 

## 2021-02-05 ENCOUNTER — Inpatient Hospital Stay: Payer: Medicare Other | Attending: Hematology

## 2021-02-05 ENCOUNTER — Inpatient Hospital Stay: Payer: Medicare Other

## 2021-02-05 ENCOUNTER — Other Ambulatory Visit: Payer: Self-pay

## 2021-02-05 VITALS — BP 145/63 | HR 69 | Temp 97.8°F | Resp 18

## 2021-02-05 DIAGNOSIS — Z86711 Personal history of pulmonary embolism: Secondary | ICD-10-CM | POA: Diagnosis not present

## 2021-02-05 DIAGNOSIS — C155 Malignant neoplasm of lower third of esophagus: Secondary | ICD-10-CM

## 2021-02-05 DIAGNOSIS — Z90722 Acquired absence of ovaries, bilateral: Secondary | ICD-10-CM | POA: Insufficient documentation

## 2021-02-05 DIAGNOSIS — Z79899 Other long term (current) drug therapy: Secondary | ICD-10-CM | POA: Insufficient documentation

## 2021-02-05 DIAGNOSIS — Z86718 Personal history of other venous thrombosis and embolism: Secondary | ICD-10-CM | POA: Diagnosis not present

## 2021-02-05 DIAGNOSIS — Z95828 Presence of other vascular implants and grafts: Secondary | ICD-10-CM

## 2021-02-05 DIAGNOSIS — Z7189 Other specified counseling: Secondary | ICD-10-CM

## 2021-02-05 DIAGNOSIS — C3490 Malignant neoplasm of unspecified part of unspecified bronchus or lung: Secondary | ICD-10-CM | POA: Insufficient documentation

## 2021-02-05 DIAGNOSIS — K521 Toxic gastroenteritis and colitis: Secondary | ICD-10-CM | POA: Diagnosis not present

## 2021-02-05 DIAGNOSIS — Z7901 Long term (current) use of anticoagulants: Secondary | ICD-10-CM | POA: Insufficient documentation

## 2021-02-05 DIAGNOSIS — F17210 Nicotine dependence, cigarettes, uncomplicated: Secondary | ICD-10-CM | POA: Diagnosis not present

## 2021-02-05 DIAGNOSIS — Z923 Personal history of irradiation: Secondary | ICD-10-CM | POA: Insufficient documentation

## 2021-02-05 DIAGNOSIS — R197 Diarrhea, unspecified: Secondary | ICD-10-CM

## 2021-02-05 DIAGNOSIS — Z90710 Acquired absence of both cervix and uterus: Secondary | ICD-10-CM | POA: Insufficient documentation

## 2021-02-05 DIAGNOSIS — Z9079 Acquired absence of other genital organ(s): Secondary | ICD-10-CM | POA: Insufficient documentation

## 2021-02-05 DIAGNOSIS — Z8501 Personal history of malignant neoplasm of esophagus: Secondary | ICD-10-CM | POA: Insufficient documentation

## 2021-02-05 DIAGNOSIS — Z800 Family history of malignant neoplasm of digestive organs: Secondary | ICD-10-CM | POA: Diagnosis not present

## 2021-02-05 DIAGNOSIS — C7951 Secondary malignant neoplasm of bone: Secondary | ICD-10-CM | POA: Diagnosis not present

## 2021-02-05 DIAGNOSIS — K529 Noninfective gastroenteritis and colitis, unspecified: Secondary | ICD-10-CM

## 2021-02-05 DIAGNOSIS — Z803 Family history of malignant neoplasm of breast: Secondary | ICD-10-CM | POA: Insufficient documentation

## 2021-02-05 DIAGNOSIS — Z9221 Personal history of antineoplastic chemotherapy: Secondary | ICD-10-CM | POA: Insufficient documentation

## 2021-02-05 LAB — CMP (CANCER CENTER ONLY)
ALT: 11 U/L (ref 0–44)
AST: 21 U/L (ref 15–41)
Albumin: 3.5 g/dL (ref 3.5–5.0)
Alkaline Phosphatase: 69 U/L (ref 38–126)
Anion gap: 8 (ref 5–15)
BUN: 15 mg/dL (ref 8–23)
CO2: 24 mmol/L (ref 22–32)
Calcium: 9 mg/dL (ref 8.9–10.3)
Chloride: 105 mmol/L (ref 98–111)
Creatinine: 1.04 mg/dL — ABNORMAL HIGH (ref 0.44–1.00)
GFR, Estimated: 54 mL/min — ABNORMAL LOW (ref >60)
Glucose, Bld: 72 mg/dL (ref 70–99)
Potassium: 4.2 mmol/L (ref 3.5–5.1)
Sodium: 137 mmol/L (ref 135–145)
Total Bilirubin: 0.3 mg/dL (ref 0.3–1.2)
Total Protein: 7.9 g/dL (ref 6.5–8.1)

## 2021-02-05 LAB — CBC WITH DIFFERENTIAL (CANCER CENTER ONLY)
Abs Immature Granulocytes: 0.02 10*3/uL (ref 0.00–0.07)
Basophils Absolute: 0.1 10*3/uL (ref 0.0–0.1)
Basophils Relative: 1 %
Eosinophils Absolute: 0.2 10*3/uL (ref 0.0–0.5)
Eosinophils Relative: 2 %
HCT: 37.4 % (ref 36.0–46.0)
Hemoglobin: 12 g/dL (ref 12.0–15.0)
Immature Granulocytes: 0 %
Lymphocytes Relative: 26 %
Lymphs Abs: 2.4 10*3/uL (ref 0.7–4.0)
MCH: 29.1 pg (ref 26.0–34.0)
MCHC: 32.1 g/dL (ref 30.0–36.0)
MCV: 90.6 fL (ref 80.0–100.0)
Monocytes Absolute: 0.8 10*3/uL (ref 0.1–1.0)
Monocytes Relative: 8 %
Neutro Abs: 5.7 10*3/uL (ref 1.7–7.7)
Neutrophils Relative %: 63 %
Platelet Count: 210 10*3/uL (ref 150–400)
RBC: 4.13 MIL/uL (ref 3.87–5.11)
RDW: 14.3 % (ref 11.5–15.5)
WBC Count: 9.2 10*3/uL (ref 4.0–10.5)
nRBC: 0 % (ref 0.0–0.2)

## 2021-02-05 LAB — IRON AND TIBC
Iron: 102 ug/dL (ref 41–142)
Saturation Ratios: 40 % (ref 21–57)
TIBC: 259 ug/dL (ref 236–444)
UIBC: 156 ug/dL (ref 120–384)

## 2021-02-05 LAB — SAMPLE TO BLOOD BANK

## 2021-02-05 MED ORDER — SODIUM CHLORIDE 0.9% FLUSH
10.0000 mL | INTRAVENOUS | Status: DC | PRN
  Administered 2021-02-05: 10 mL via INTRAVENOUS
  Filled 2021-02-05: qty 10

## 2021-02-05 MED ORDER — OCTREOTIDE ACETATE 30 MG IM KIT
PACK | INTRAMUSCULAR | Status: AC
  Filled 2021-02-05: qty 1

## 2021-02-05 MED ORDER — OCTREOTIDE ACETATE 30 MG IM KIT
30.0000 mg | PACK | Freq: Once | INTRAMUSCULAR | Status: AC
  Administered 2021-02-05: 30 mg via INTRAMUSCULAR

## 2021-02-05 MED ORDER — HEPARIN SOD (PORK) LOCK FLUSH 100 UNIT/ML IV SOLN
500.0000 [IU] | Freq: Once | INTRAVENOUS | Status: AC
  Administered 2021-02-05: 500 [IU] via INTRAVENOUS
  Filled 2021-02-05: qty 5

## 2021-02-05 NOTE — Patient Instructions (Signed)

## 2021-02-05 NOTE — Patient Instructions (Signed)
Octreotide injection solution What is this medicine? OCTREOTIDE (ok TREE oh tide) is used to reduce blood levels of growth hormone in patients with a condition called acromegaly. This medicine also reduces flushing and watery diarrhea caused by certain types of cancer. This medicine may be used for other purposes; ask your health care provider or pharmacist if you have questions. COMMON BRAND NAME(S): Bynfezia, Sandostatin What should I tell my health care provider before I take this medicine? They need to know if you have any of these conditions:  diabetes  gallbladder disease  kidney disease  liver disease  thyroid disease  an unusual or allergic reaction to octreotide, other medicines, foods, dyes, or preservatives  pregnant or trying to get pregnant  breast-feeding How should I use this medicine? This medicine is for injection under the skin or into a vein (only in emergency situations). It is usually given by a health care professional in a hospital or clinic setting. If you get this medicine at home, you will be taught how to prepare and give this medicine. Allow the injection solution to come to room temperature before use. Do not warm it artificially. Use exactly as directed. Take your medicine at regular intervals. Do not take your medicine more often than directed. It is important that you put your used needles and syringes in a special sharps container. Do not put them in a trash can. If you do not have a sharps container, call your pharmacist or healthcare provider to get one. Talk to your pediatrician regarding the use of this medicine in children. Special care may be needed. Overdosage: If you think you have taken too much of this medicine contact a poison control center or emergency room at once. NOTE: This medicine is only for you. Do not share this medicine with others. What if I miss a dose? If you miss a dose, take it as soon as you can. If it is almost time for your  next dose, take only that dose. Do not take double or extra doses. What may interact with this medicine?  bromocriptine  certain medicines for blood pressure, heart disease, irregular heartbeat  cyclosporine  diuretics  medicines for diabetes, including insulin  quinidine This list may not describe all possible interactions. Give your health care provider a list of all the medicines, herbs, non-prescription drugs, or dietary supplements you use. Also tell them if you smoke, drink alcohol, or use illegal drugs. Some items may interact with your medicine. What should I watch for while using this medicine? Visit your doctor or health care professional for regular checks on your progress. To help reduce irritation at the injection site, use a different site for each injection and make sure the solution is at room temperature before use. This medicine may cause decreases in blood sugar. Signs of low blood sugar include chills, cool, pale skin or cold sweats, drowsiness, extreme hunger, fast heartbeat, headache, nausea, nervousness or anxiety, shakiness, trembling, unsteadiness, tiredness, or weakness. Contact your doctor or health care professional right away if you experience any of these symptoms. This medicine may increase blood sugar. Ask your healthcare provider if changes in diet or medicines are needed if you have diabetes. This medicine may cause a decrease in vitamin B12. You should make sure that you get enough vitamin B12 while you are taking this medicine. Discuss the foods you eat and the vitamins you take with your health care professional. What side effects may I notice from receiving this medicine? Side   effects that you should report to your doctor or health care professional as soon as possible:  allergic reactions like skin rash, itching or hives, swelling of the face, lips, or tongue  fast, slow, or irregular heartbeat  right upper belly pain  severe stomach pain  signs  and symptoms of high blood sugar such as being more thirsty or hungry or having to urinate more than normal. You may also feel very tired or have blurry vision.  signs and symptoms of low blood sugar such as feeling anxious; confusion; dizziness; increased hunger; unusually weak or tired; increased sweating; shakiness; cold, clammy skin; irritable; headache; blurred vision; fast heartbeat; loss of consciousness  unusually weak or tired Side effects that usually do not require medical attention (report to your doctor or health care professional if they continue or are bothersome):  diarrhea  dizziness  gas  headache  nausea, vomiting  pain, redness, or irritation at site where injected  upset stomach This list may not describe all possible side effects. Call your doctor for medical advice about side effects. You may report side effects to FDA at 1-800-FDA-1088. Where should I keep my medicine? Keep out of the reach of children. Store in a refrigerator between 2 and 8 degrees C (36 and 46 degrees F). Protect from light. Allow to come to room temperature naturally. Do not use artificial heat. If protected from light, the injection may be stored at room temperature between 20 and 30 degrees C (70 and 86 degrees F) for 14 days. After the initial use, throw away any unused portion of a multiple dose vial after 14 days. Throw away unused portions of the ampules after use. NOTE: This sheet is a summary. It may not cover all possible information. If you have questions about this medicine, talk to your doctor, pharmacist, or health care provider.  2021 Elsevier/Gold Standard (2019-06-20 13:33:09)  

## 2021-02-07 ENCOUNTER — Other Ambulatory Visit: Payer: Self-pay | Admitting: Physician Assistant

## 2021-02-07 DIAGNOSIS — F339 Major depressive disorder, recurrent, unspecified: Secondary | ICD-10-CM

## 2021-02-16 ENCOUNTER — Encounter: Payer: Self-pay | Admitting: Physician Assistant

## 2021-02-16 ENCOUNTER — Other Ambulatory Visit: Payer: Self-pay

## 2021-02-16 ENCOUNTER — Ambulatory Visit (INDEPENDENT_AMBULATORY_CARE_PROVIDER_SITE_OTHER): Payer: Medicare Other | Admitting: Physician Assistant

## 2021-02-16 VITALS — BP 114/73 | HR 74 | Temp 97.8°F | Ht 68.0 in | Wt 146.7 lb

## 2021-02-16 DIAGNOSIS — R0600 Dyspnea, unspecified: Secondary | ICD-10-CM

## 2021-02-16 DIAGNOSIS — R062 Wheezing: Secondary | ICD-10-CM

## 2021-02-16 DIAGNOSIS — R0609 Other forms of dyspnea: Secondary | ICD-10-CM

## 2021-02-16 DIAGNOSIS — F339 Major depressive disorder, recurrent, unspecified: Secondary | ICD-10-CM | POA: Diagnosis not present

## 2021-02-16 DIAGNOSIS — I10 Essential (primary) hypertension: Secondary | ICD-10-CM | POA: Diagnosis not present

## 2021-02-16 DIAGNOSIS — E785 Hyperlipidemia, unspecified: Secondary | ICD-10-CM | POA: Diagnosis not present

## 2021-02-16 MED ORDER — ALBUTEROL SULFATE HFA 108 (90 BASE) MCG/ACT IN AERS: 2.0000 | INHALATION_SPRAY | Freq: Four times a day (QID) | RESPIRATORY_TRACT | 2 refills | Status: DC | PRN

## 2021-02-16 NOTE — Patient Instructions (Signed)
Bronchospasm, Adult  Bronchospasm is when the small airways in the lungs narrow. This can make it very hard to breathe. Swelling and more mucus than normal can add to this problem. What are the causes? Common causes of this condition include:  Having a cold.  Exercise.  The smell from sprays, perfumes, candles, and cleaners.  Cold air.  Stress, laughing, or crying. What increases the risk?  Having asthma.  Smoking.  Having allergies.  Being allergic to certain foods, medicine, or bug bites or stings. What are the signs or symptoms? Symptoms of this condition include:  Making whistling sounds when you breathe (wheezing).  Coughing.  A tight feeling in your chest.  Feeling like you cannot catch your breath.  Feeling like you have no energy to exercise.  Breathing that is noisy.  A cough that has a high pitch. How is this treated? This condition may be treated by:  Using medicines that you breathe in. These open up the airways and help you breathe. Medicines can be taken with a metered dose inhaler or a nebulizer device.  Taking medicines to reduce swelling.  Getting rid of what started the bronchospasm. Follow these instructions at home: Medicines  Take over-the-counter and prescription medicines only as told by your doctor.  If you need to use an inhaler or nebulizer to take your medicine, ask your doctor how to use it.  You may be given a spacer to use with your inhaler. This makes it easier to get the medicine from the inhaler into your lungs. Lifestyle  Do not use any products that have nicotine or tobacco. This includes cigarettes, e-cigarettes, and chewing tobacco. If you need help quitting, ask your doctor.  Keep track of things that start your bronchospasm. Avoid these if you are able to.  When pollen, air pollution, or humidity is bad, keep windows closed. Use an air conditioner if you have one.  Find ways to cope with stress and your  feelings. Activity Some people have bronchospasm when they exercise. This is called exercise-induced bronchoconstriction (EIB). If you have this problem, talk with your doctor about how to deal with EIB. Some tips include:  Use your inhaler before exercise.  Exercise indoors if it is very cold, humid, or the pollen and mold count is high.  Warm up and cool down before and after exercise.  Stop your exercise right away if your symptoms start or get worse. General instructions  If you have asthma, make sure you have an asthma action plan.  Stay up to date on your shots (immunizations).  Keep all follow-up visits as told by your doctor. This is important. Get help right away if:  You have trouble breathing.  You wheeze and cough and this does not get better after you take medicine.  You have chest pain.  You have trouble speaking more than one word in a sentence. These symptoms may be an emergency. Do not wait to see if the symptoms will go away. Get medical help right away. Call your local emergency services (911 in the [place] Do not drive yourself to the hospital. Summary  Bronchospasm is when the small airways in the lungs narrow. Swelling and more mucus than normal can add to this problem. This can make it very hard to breathe.  Get help right away if you wheeze and cough and this does not get better after you take medicine.  Do not use any products that have nicotine or tobacco. This includes cigarettes, e-cigarettes,  and chewing tobacco. This information is not intended to replace advice given to you by your health care provider. Make sure you discuss any questions you have with your health care provider. Document Revised: 12/31/2019 Document Reviewed: 12/31/2019 Elsevier Patient Education  2021 [employer].

## 2021-02-16 NOTE — Progress Notes (Signed)
Established Patient Office Visit  Subjective:  Patient ID: Cindy Byrd, female    DOB: 1941/05/02  Age: 80 y.o. MRN: [id number]  CC:  Chief Complaint  Patient presents with  . Depression    HPI Cindy Byrd presents for follow up on mood management. Patient reports medications are working well. Is now taking Buspar 10 mg twice daily instead of three times daily which seems to be working fine. States would not have been able to make it through the [place] without medications. Has c/o feeling short winded with activity which has been a chronic issue. States yesterday started working on her garden and felt some shortness of breath and this morning woke up with slight runny nose and feels a tickle in her throat. Has noticed a whistling sound.  HTN: Pt denies chest pain, palpitations, or lower extremity edema. Reports an episode where her blood pressure was low, 80s/60s and rechecked it later that day which improved to 110/70s. Does experience some dizziness especially with position changes. Taking medication as directed without side effects. States does not drink much water as she should.    Past Medical History:  Diagnosis Date  . Barrett's esophagus   . Bone neoplasm 06/24/2015  . Cancer [facility])    metastatic poorly differentiated carcinoma. tumor left groin surgical removal with radiation tx.  . Cataract    BILATERAL  . Cigarette smoker two packs a day or less    Currently still smoking 2 PPD - Not interested in quitting at this time.  . Colitis 2017  . Colon polyps    hyperplastic, tubular adenomas, tubulovillous adenoma  . Cough, persistent    hx. lung cancer ? primary-being evaluated, unsure of primary site.  . Depression 06/24/2015  . Diverticulosis   . Emphysema of lung ([place])   . Endometriosis    Hysterectomy with BSO at age 9 yrs  . Esophageal adenocarcinoma ([place]) 08/11/15   intramucosal  . Gastritis   . GERD (gastroesophageal reflux disease)   . H/O: pneumonia    . Hiatal hernia   . Hyperlipidemia   . Hypertension 06/24/2015   likely improved incidental to 40 lbs weight loss from her neoplasm. No Longer taking med for this as of 08-06-15  . IBS (irritable bowel syndrome)   . Pain    left hip-persistent"tumor of bone"-radiation tx. 10.  . Vitamin D deficiency disease     Past Surgical History:  Procedure Laterality Date  . ABDOMINAL HYSTERECTOMY    . BALLOON DILATION N/A 10/08/2019   Procedure: BALLOON DILATION;  Surgeon: [name], MD;  Location: [facility];  Service: Gastroenterology;  Laterality: N/A;  . BARTHOLIN GLAND CYST EXCISION  80 yo ago   Does not want if it was an infected cyst or tumor. Was soon as delivery  . BIOPSY  01/02/2019   Procedure: BIOPSY;  Surgeon: [name], MD;  Location: [facility];  Service: Gastroenterology;;  . CATARACT EXTRACTION    . COLONOSCOPY W/ POLYPECTOMY     multiple times - last done 09/2014 per patient.  . ESOPHAGOGASTRODUODENOSCOPY (EGD) WITH PROPOFOL N/A 08/11/2015   Procedure: ESOPHAGOGASTRODUODENOSCOPY (EGD) WITH PROPOFOL;  Surgeon: [name], MD;  Location: [facility];  Service: Gastroenterology;  Laterality: N/A;  . ESOPHAGOGASTRODUODENOSCOPY (EGD) WITH PROPOFOL N/A 01/02/2019   Procedure: ESOPHAGOGASTRODUODENOSCOPY (EGD) WITH PROPOFOL;  Surgeon: [name], MD;  Location: [facility];  Service: Gastroenterology;  Laterality: N/A;  . ESOPHAGOGASTRODUODENOSCOPY (EGD) WITH PROPOFOL N/A 10/08/2019   Procedure: ESOPHAGOGASTRODUODENOSCOPY (  EGD) WITH PROPOFOL;  Surgeon: [name], MD;  Location: [facility];  Service: Gastroenterology;  Laterality: N/A;  . FLEXIBLE SIGMOIDOSCOPY N/A 06/24/2017   Procedure: FLEXIBLE SIGMOIDOSCOPY;  Surgeon: Manus Gunning, MD;  Location: [facility];  Service: Gastroenterology;  Laterality: N/A;  . GANGLION CYST EXCISION    . KNEE ARTHROSCOPY  age about 17 yrs  . TONSILLECTOMY    . TOTAL ABDOMINAL HYSTERECTOMY W/ BILATERAL SALPINGOOPHORECTOMY   at age 3 yrs   For endometriosis    Family History  Problem Relation Age of Onset  . Colon cancer Brother   . Colon cancer Brother   . Stroke Mother   . Colon cancer Father   . Emphysema Father        smoked  . Breast cancer Daughter 85       ER/PR+ stage II    Social History   Socioeconomic History  . Marital status: Widowed    Spouse name: Not on file  . Number of children: 2  . Years of education: Not on file  . Highest education level: Not on file  Occupational History  . Not on file  Tobacco Use  . Smoking status: Former Smoker    Packs/day: 1.00    Years: 60.00    Pack years: 60.00    Types: Cigarettes    Quit date: 12/05/2014    Years since quitting: 6.2  . Smokeless tobacco: Never Used  Vaping Use  . Vaping Use: Never used  Substance and Sexual Activity  . Alcohol use: No    Alcohol/week: 0.0 standard drinks  . Drug use: No  . Sexual activity: Not Currently  Other Topics Concern  . Not on file  Social History Narrative  . Not on file   Social Determinants of Health   Financial Resource Strain: Not on file  Food Insecurity: Not on file  Transportation Needs: Not on file  Physical Activity: Not on file  Stress: Not on file  Social Connections: Not on file  Intimate Partner Violence: Not on file    Outpatient Medications Prior to Visit  Medication Sig Dispense Refill  . amLODipine (NORVASC) 5 MG tablet TAKE 1 TABLET BY MOUTH EVERY DAY 90 tablet 1  . Biotin 5 MG CAPS Take 5 mg by mouth daily.    . busPIRone (BUSPAR) 10 MG tablet TAKE 1 TABLET BY MOUTH THREE TIMES A DAY 270 tablet 0  . Calcium Citrate-Vitamin D (CALCIUM + D PO) Take 1 tablet by mouth daily.    . Cyanocobalamin (B-12) 2500 MCG TABS Take 2,500 mcg by mouth daily.    [name] FLUoxetine (PROZAC) 40 MG capsule TAKE 1 CAPSULE BY MOUTH EVERY DAY 90 capsule 1  . omeprazole (PRILOSEC) 40 MG capsule TAKE 1 CAPSULE BY MOUTH EVERY DAY BEFORE BREAKFAST 90 capsule 1  . traZODone (DESYREL) 50 MG tablet  TAKE 1 TABLET BY MOUTH EVERYDAY AT BEDTIME 90 tablet 2  . XARELTO 20 MG TABS tablet TAKE 1 TABLET BY MOUTH DAILY WITH SUPPER. 30 tablet 2  . doxycycline (VIBRA-TABS) 100 MG tablet Take 1 tablet (100 mg total) by mouth 2 (two) times daily. 20 tablet 0  . Vitamin D, Ergocalciferol, (DRISDOL) 1.25 MG (50000 UNIT) CAPS capsule TAKE 1 CAPSULE BY MOUTH EVERY 7 DAYS (Patient not taking: Reported on 02/16/2021) 12 capsule 3   No facility-administered medications prior to visit.    Allergies  Allergen Reactions  . Penicillins Other (See Comments)    Unknown; childhood allergy Did it  involve swelling of the face/tongue/throat, SOB, or low BP? Unknown Did it involve sudden or severe rash/hives, skin peeling, or any reaction on the inside of your mouth or nose? Unknown Did you need to seek medical attention at a hospital or doctor's office? Unknown When did it last happen?childhood allergy If all above answers are "NO", may proceed with cephalosporin use.   . Remeron [Mirtazapine] Other (See Comments)    nightmares  . Latex Rash    ROS Review of Systems A fourteen system review of systems was performed and found to be positive as per HPI.   Objective:    Physical Exam General:  Pleasant and cooperative, in no acute distress  Neuro:  Alert and oriented,  extra-ocular muscles intact  HEENT:  Normocephalic, atraumatic, neck supple  Skin:  no gross rash, warm, pink. Cardiac:  RRR, S1 S2 wnl's  Respiratory:  Mild expiratory wheezing noted, no crackles or rales, Not using accessory muscles, speaking in full sentences- unlabored. Vascular:  Ext warm, no cyanosis apprec.; cap RF less 2 sec. No gross edema  Psych:  No HI/SI, judgement and insight good, Euthymic mood. Full Affect.   BP 114/73   Pulse 74   Temp 97.8 F (36.6 C)   Ht 5' 8"  (1.727 m)   Wt 146 lb 11.2 oz (66.5 kg)   SpO2 96%   BMI 22.31 kg/m  Wt Readings from Last 3 Encounters:  02/16/21 146 lb 11.2 oz (66.5 kg)   01/08/21 144 lb 8 oz (65.5 kg)  11/18/20 136 lb 9.6 oz (62 kg)     Health Maintenance Due  Topic Date Due  . TETANUS/TDAP  Never done  . PNA vac Low Risk Adult (1 of 2 - PCV13) Never done  . COVID-19 Vaccine (2 - Pfizer risk 4-dose series) 07/30/2020    There are no preventive care reminders to display for this patient.  Lab Results  Component Value Date   TSH 2.740 12/13/2019   Lab Results  Component Value Date   WBC 9.2 02/05/2021   HGB 12.0 02/05/2021   HCT 37.4 02/05/2021   MCV 90.6 02/05/2021   PLT 210 02/05/2021   Lab Results  Component Value Date   NA 137 02/05/2021   K 4.2 02/05/2021   CHLORIDE 103 11/16/2017   CO2 24 02/05/2021   GLUCOSE 72 02/05/2021   BUN 15 02/05/2021   CREATININE 1.04 (H) 02/05/2021   BILITOT 0.3 02/05/2021   ALKPHOS 69 02/05/2021   AST 21 02/05/2021   ALT 11 02/05/2021   PROT 7.9 02/05/2021   ALBUMIN 3.5 02/05/2021   CALCIUM 9.0 02/05/2021   ANIONGAP 8 02/05/2021   EGFR >60 11/16/2017   GFR 59.23 (L) 10/12/2016   Lab Results  Component Value Date   CHOL 178 07/03/2020   Lab Results  Component Value Date   HDL 47 07/03/2020   Lab Results  Component Value Date   LDLCALC 108 (H) 07/03/2020   Lab Results  Component Value Date   TRIG 129 07/03/2020   Lab Results  Component Value Date   CHOLHDL 3.8 07/03/2020   Lab Results  Component Value Date   HGBA1C 5.5 12/13/2019      Assessment & Plan:   Problem List Items Addressed This Visit      Other   Depression - Primary    Other Visit Diagnoses    Essential hypertension       Shortness of breath       Relevant Medications  albuterol (VENTOLIN HFA) 108 (90 Base) MCG/ACT inhaler   Wheezing on auscultation       Relevant Medications   albuterol (VENTOLIN HFA) 108 (90 Base) MCG/ACT inhaler     Depression: -Improved. PHQ-9 score of 5. Denies SI/HI. -Continue current medication regimen. Advised can take Buspar TID as needed, continue BID dosing.  -Will  continue to monitor.  Essential hypertension: -BP today controlled. -Continue current medication regimen.  -Continue ambulatory BP monitoring and if continues to have recurrent episodes of hypotension (BP<100/60) should notify the clinic. -Recommend to increase water hydration.  Dyspnea on exertion, Wheezing on auscultation: -Patient undergoing chemotherapy for metastatic lung cancer and reviewed 07/15/2020 chest CT which also reveals evidence of emphysema. Advised to use albuterol inhaler as needed for shortness of breath/wheezing. -If symptoms fail to improve or worsen recommend to discuss with oncologist.   Hyperlipidemia: -Last lipid panel: total cholesterol 178, triglycerides 129, HDL 47, LDL 108 (mildly elevated). -Will repeat lipid panel at follow up visit.   Meds ordered this encounter  Medications  . albuterol (VENTOLIN HFA) 108 (90 Base) MCG/ACT inhaler    Sig: Inhale 2 puffs into the lungs every 6 (six) hours as needed for wheezing or shortness of breath.    Dispense:  8 g    Refill:  2    Order Specific Question:   Supervising Provider    Answer:   Beatrice Lecher D [[id number]]    Follow-up: Return in about 4 months (around 06/18/2021) for Mood, HTN and FBW .   Note:  This note was prepared with assistance of Dragon voice recognition software. Occasional wrong-word or sound-a-like substitutions may have occurred due to the inherent limitations of voice recognition software.  [name], PA-C

## 2021-02-20 ENCOUNTER — Other Ambulatory Visit: Payer: Self-pay | Admitting: Physician Assistant

## 2021-02-20 DIAGNOSIS — F339 Major depressive disorder, recurrent, unspecified: Secondary | ICD-10-CM

## 2021-02-22 ENCOUNTER — Telehealth: Payer: Self-pay | Admitting: Physician Assistant

## 2021-02-22 NOTE — Telephone Encounter (Signed)
Patient has been added to the schedule for tomorrow. AS, CMA

## 2021-02-22 NOTE — Telephone Encounter (Signed)
Patient has been coughing a lot, and is spiting up fleam and thinks she may have laryngitis. Please advise, thanks.

## 2021-02-23 ENCOUNTER — Other Ambulatory Visit: Payer: Self-pay

## 2021-02-23 ENCOUNTER — Encounter: Payer: Self-pay | Admitting: Nurse Practitioner

## 2021-02-23 ENCOUNTER — Ambulatory Visit (INDEPENDENT_AMBULATORY_CARE_PROVIDER_SITE_OTHER): Payer: Medicare Other | Admitting: Nurse Practitioner

## 2021-02-23 VITALS — BP 100/58 | HR 62 | Temp 98.0°F | Ht 68.0 in | Wt 144.9 lb

## 2021-02-23 DIAGNOSIS — J0140 Acute pansinusitis, unspecified: Secondary | ICD-10-CM

## 2021-02-23 DIAGNOSIS — R059 Cough, unspecified: Secondary | ICD-10-CM | POA: Diagnosis not present

## 2021-02-23 DIAGNOSIS — C3490 Malignant neoplasm of unspecified part of unspecified bronchus or lung: Secondary | ICD-10-CM | POA: Diagnosis not present

## 2021-02-23 DIAGNOSIS — J019 Acute sinusitis, unspecified: Secondary | ICD-10-CM | POA: Insufficient documentation

## 2021-02-23 MED ORDER — AZITHROMYCIN 250 MG PO TABS: ORAL_TABLET | ORAL | 0 refills | Status: DC

## 2021-02-23 NOTE — Progress Notes (Signed)
Acute Office Visit  Subjective:    Patient ID: Cindy Byrd, female    DOB: 03-23-1941, 80 y.o.   MRN: [id number]  Chief Complaint  Patient presents with  . Cough    HPI Patient is in today for evaluation of cough and increased shortness of breath. She states that cough has been productive. She has coughed up green and blood tinged sputum. She has been treated for Stage 4 lung cancer in the past. She states that she does have some chronic shortness of breath. This cough is causing her shortness of breath to be worse than her baseline. She has congestion in the nasal passages and the sinuses. She states that her throat is very sore. It is more sore when she coughs. She denies fever. Has noted some night sweats. She states her appetite is a little decreased and she has lost a few pounds since her last visit which was last Tuesday. She denies nausea and/or vomiting. She states that these symptoms started on Wednesday, the day after her last routine visit. She has taken a home COVID 19 test and results were negative.  She states that she was sent in a prescription for albuterol inhaler. She states that she has felt so poorly that she has not been able to pick it up yet. She states that she is scheduled to have CT chest on Thursday per [place].   Past Medical History:  Diagnosis Date  . Barrett's esophagus   . Bone neoplasm 06/24/2015  . Cancer [facility])    metastatic poorly differentiated carcinoma. tumor left groin surgical removal with radiation tx.  . Cataract    BILATERAL  . Cigarette smoker two packs a day or less    Currently still smoking 2 PPD - Not interested in quitting at this time.  . Colitis 2017  . Colon polyps    hyperplastic, tubular adenomas, tubulovillous adenoma  . Cough, persistent    hx. lung cancer ? primary-being evaluated, unsure of primary site.  . Depression 06/24/2015  . Diverticulosis   . Emphysema of lung ([place])   . Endometriosis    Hysterectomy with  BSO at age 29 yrs  . Esophageal adenocarcinoma ([place]) 08/11/15   intramucosal  . Gastritis   . GERD (gastroesophageal reflux disease)   . H/O: pneumonia   . Hiatal hernia   . Hyperlipidemia   . Hypertension 06/24/2015   likely improved incidental to 40 lbs weight loss from her neoplasm. No Longer taking med for this as of 08-06-15  . IBS (irritable bowel syndrome)   . Pain    left hip-persistent"tumor of bone"-radiation tx. 10.  . Vitamin D deficiency disease     Past Surgical History:  Procedure Laterality Date  . ABDOMINAL HYSTERECTOMY    . BALLOON DILATION N/A 10/08/2019   Procedure: BALLOON DILATION;  Surgeon: [name], MD;  Location: [facility];  Service: Gastroenterology;  Laterality: N/A;  . BARTHOLIN GLAND CYST EXCISION  80 yo ago   Does not want if it was an infected cyst or tumor. Was soon as delivery  . BIOPSY  01/02/2019   Procedure: BIOPSY;  Surgeon: [name], MD;  Location: [facility];  Service: Gastroenterology;;  . CATARACT EXTRACTION    . COLONOSCOPY W/ POLYPECTOMY     multiple times - last done 09/2014 per patient.  . ESOPHAGOGASTRODUODENOSCOPY (EGD) WITH PROPOFOL N/A 08/11/2015   Procedure: ESOPHAGOGASTRODUODENOSCOPY (EGD) WITH PROPOFOL;  Surgeon: [name], MD;  Location: [facility];  Service: Gastroenterology;  Laterality: N/A;  . ESOPHAGOGASTRODUODENOSCOPY (EGD) WITH PROPOFOL N/A 01/02/2019   Procedure: ESOPHAGOGASTRODUODENOSCOPY (EGD) WITH PROPOFOL;  Surgeon: [name], MD;  Location: [facility];  Service: Gastroenterology;  Laterality: N/A;  . ESOPHAGOGASTRODUODENOSCOPY (EGD) WITH PROPOFOL N/A 10/08/2019   Procedure: ESOPHAGOGASTRODUODENOSCOPY (EGD) WITH PROPOFOL;  Surgeon: [name], MD;  Location: [facility];  Service: Gastroenterology;  Laterality: N/A;  . FLEXIBLE SIGMOIDOSCOPY N/A 06/24/2017   Procedure: FLEXIBLE SIGMOIDOSCOPY;  Surgeon: Manus Gunning, MD;  Location: [facility];  Service: Gastroenterology;  Laterality: N/A;   . GANGLION CYST EXCISION    . KNEE ARTHROSCOPY  age about 73 yrs  . TONSILLECTOMY    . TOTAL ABDOMINAL HYSTERECTOMY W/ BILATERAL SALPINGOOPHORECTOMY  at age 74 yrs   For endometriosis    Family History  Problem Relation Age of Onset  . Colon cancer Brother   . Colon cancer Brother   . Stroke Mother   . Colon cancer Father   . Emphysema Father        smoked  . Breast cancer Daughter 34       ER/PR+ stage II    Social History   Socioeconomic History  . Marital status: Widowed    Spouse name: Not on file  . Number of children: 2  . Years of education: Not on file  . Highest education level: Not on file  Occupational History  . Not on file  Tobacco Use  . Smoking status: Former Smoker    Packs/day: 1.00    Years: 60.00    Pack years: 60.00    Types: Cigarettes    Quit date: 12/05/2014    Years since quitting: 6.2  . Smokeless tobacco: Never Used  Vaping Use  . Vaping Use: Never used  Substance and Sexual Activity  . Alcohol use: No    Alcohol/week: 0.0 standard drinks  . Drug use: No  . Sexual activity: Not Currently  Other Topics Concern  . Not on file  Social History Narrative  . Not on file   Social Determinants of Health   Financial Resource Strain: Not on file  Food Insecurity: Not on file  Transportation Needs: Not on file  Physical Activity: Not on file  Stress: Not on file  Social Connections: Not on file  Intimate Partner Violence: Not on file    Outpatient Medications Prior to Visit  Medication Sig Dispense Refill  . albuterol (VENTOLIN HFA) 108 (90 Base) MCG/ACT inhaler Inhale 2 puffs into the lungs every 6 (six) hours as needed for wheezing or shortness of breath. 8 g 2  . amLODipine (NORVASC) 5 MG tablet TAKE 1 TABLET BY MOUTH EVERY DAY 90 tablet 1  . Biotin 5 MG CAPS Take 5 mg by mouth daily.    . busPIRone (BUSPAR) 10 MG tablet TAKE 1 TABLET BY MOUTH THREE TIMES A DAY 270 tablet 0  . Calcium Citrate-Vitamin D (CALCIUM + D PO) Take 1 tablet  by mouth daily.    . Cyanocobalamin (B-12) 2500 MCG TABS Take 2,500 mcg by mouth daily.    [name] FLUoxetine (PROZAC) 40 MG capsule TAKE 1 CAPSULE BY MOUTH EVERY DAY 90 capsule 1  . omeprazole (PRILOSEC) 40 MG capsule TAKE 1 CAPSULE BY MOUTH EVERY DAY BEFORE BREAKFAST 90 capsule 1  . traZODone (DESYREL) 50 MG tablet TAKE 1 TABLET BY MOUTH EVERYDAY AT BEDTIME 90 tablet 2  . XARELTO 20 MG TABS tablet TAKE 1 TABLET BY MOUTH DAILY WITH SUPPER. 30 tablet 2  No facility-administered medications prior to visit.    Allergies  Allergen Reactions  . Penicillins Other (See Comments)    Unknown; childhood allergy Did it involve swelling of the face/tongue/throat, SOB, or low BP? Unknown Did it involve sudden or severe rash/hives, skin peeling, or any reaction on the inside of your mouth or nose? Unknown Did you need to seek medical attention at a hospital or doctor's office? Unknown When did it last happen?childhood allergy If all above answers are "NO", may proceed with cephalosporin use.   . Remeron [Mirtazapine] Other (See Comments)    nightmares  . Latex Rash    Review of Systems  Constitutional: Positive for appetite change and fatigue. Negative for chills and fever.       Reports decreased appetite.   HENT: Positive for congestion, postnasal drip, rhinorrhea and sore throat. Negative for sinus pain.   Respiratory: Positive for cough, shortness of breath and wheezing.   Cardiovascular: Negative for chest pain and palpitations.  Gastrointestinal: Negative for constipation, nausea and vomiting.  Musculoskeletal: Negative for back pain and myalgias.  Skin: Negative for rash.  Neurological: Positive for headaches. Negative for dizziness and weakness.  Hematological: Positive for adenopathy.  Psychiatric/Behavioral: The patient is not nervous/anxious.   All other systems reviewed and are negative.      Objective:    Physical Exam Vitals and nursing note reviewed.  Constitutional:       Appearance: Normal appearance. She is well-developed. She is ill-appearing.  HENT:     Head: Normocephalic and atraumatic.     Right Ear: Ear canal and external ear normal.     Left Ear: Ear canal and external ear normal.     Nose: Congestion present.     Right Sinus: No maxillary sinus tenderness or frontal sinus tenderness.     Left Sinus: No maxillary sinus tenderness or frontal sinus tenderness.     Mouth/Throat:     Pharynx: Posterior oropharyngeal erythema present.  Eyes:     Pupils: Pupils are equal, round, and reactive to light.  Cardiovascular:     Rate and Rhythm: Normal rate and regular rhythm.     Heart sounds: Normal heart sounds.  Pulmonary:     Effort: Pulmonary effort is normal.     Breath sounds: Rhonchi present.     Comments: Rhonchi clear with cough. Cough is loose and congested sounding. It is non productive during office visit.  Abdominal:     Palpations: Abdomen is soft.  Musculoskeletal:        General: Normal range of motion.     Cervical back: Normal range of motion and neck supple.  Lymphadenopathy:     Cervical: Cervical adenopathy present.  Skin:    General: Skin is warm and dry.     Capillary Refill: Capillary refill takes less than 2 seconds.  Neurological:     General: No focal deficit present.     Mental Status: She is alert and oriented to person, place, and time.  Psychiatric:        Mood and Affect: Mood normal.        Behavior: Behavior normal.        Thought Content: Thought content normal.        Judgment: Judgment normal.     Today's Vitals   02/23/21 0931 02/23/21 0936  BP: (!) 100/58   Pulse: (!) 49 62  Temp: 98 F (36.7 C)   SpO2: 97%   Weight: 144 lb 14.4 oz (65.7  kg)   Height: _0  (1.727 m)    Body mass index is 22.03 kg/m.    Wt Readings from Last 3 Encounters:  02/23/21 144 lb 14.4 oz (65.7 kg)  02/16/21 146 lb 11.2 oz (66.5 kg)  01/08/21 144 lb 8 oz (65.5 kg)    Health Maintenance Due  Topic Date Due   . TETANUS/TDAP  Never done  . PNA vac Low Risk Adult (1 of 2 - PCV13) Never done    There are no preventive care reminders to display for this patient.   Lab Results  Component Value Date   TSH 2.740 12/13/2019   Lab Results  Component Value Date   WBC 9.2 02/05/2021   HGB 12.0 02/05/2021   HCT 37.4 02/05/2021   MCV 90.6 02/05/2021   PLT 210 02/05/2021   Lab Results  Component Value Date   NA 137 02/05/2021   K 4.2 02/05/2021   CHLORIDE 103 11/16/2017   CO2 24 02/05/2021   GLUCOSE 72 02/05/2021   BUN 15 02/05/2021   CREATININE 1.04 (H) 02/05/2021   BILITOT 0.3 02/05/2021   ALKPHOS 69 02/05/2021   AST 21 02/05/2021   ALT 11 02/05/2021   PROT 7.9 02/05/2021   ALBUMIN 3.5 02/05/2021   CALCIUM 9.0 02/05/2021   ANIONGAP 8 02/05/2021   EGFR >60 11/16/2017   GFR 59.23 (L) 10/12/2016   Lab Results  Component Value Date   CHOL 178 07/03/2020   Lab Results  Component Value Date   HDL 47 07/03/2020   Lab Results  Component Value Date   LDLCALC 108 (H) 07/03/2020   Lab Results  Component Value Date   TRIG 129 07/03/2020   Lab Results  Component Value Date   CHOLHDL 3.8 07/03/2020   Lab Results  Component Value Date   HGBA1C 5.5 12/13/2019       Assessment & Plan:  1. Acute non-recurrent pansinusitis Start z-pack. Take as directed for 5 days. Rest and increase fluids. She should take OTC medications to improve acute symptoms.  - azithromycin (ZITHROMAX) 250 MG tablet; z-pack - take as directed for 5 days  Dispense: 6 tablet; Refill: 0  2. Cough Encouraged her to pick up previously prescribed rescue inhaler and use it as needed and as prescribed to help with cough and SOB.   3. Metastatic lung cancer (metastasis from lung to other site), unspecified laterality [facility]) Patient scheduled for CT chest on Thursday 02/25/2021 for surveillance. Follow up with [place] as scheduled.   Problem List Items Addressed This Visit      Respiratory   Metastatic  lung cancer (metastasis from lung to other site), unspecified laterality ([place])   Relevant Medications   azithromycin (ZITHROMAX) 250 MG tablet   Acute non-recurrent pansinusitis - Primary   Relevant Medications   azithromycin (ZITHROMAX) 250 MG tablet     Other   Cough       Meds ordered this encounter  Medications  . azithromycin (ZITHROMAX) 250 MG tablet    Sig: z-pack - take as directed for 5 days    Dispense:  6 tablet    Refill:  0    Order Specific Question:   Supervising Provider    Answer:   Beatrice Lecher D [[id number]]   Time spent with the patient was approximately 25 minutes. This time included reviewing progress notes, labs, imaging studies, and discussing plan for follow up.   Ronnell Freshwater, NP

## 2021-02-23 NOTE — Patient Instructions (Signed)

## 2021-02-25 ENCOUNTER — Ambulatory Visit (HOSPITAL_COMMUNITY)
Admission: RE | Admit: 2021-02-25 | Discharge: 2021-02-25 | Disposition: A | Discharge status: Home / Self Care | Payer: Medicare Other | Source: Ambulatory Visit | Attending: Hematology | Admitting: Hematology

## 2021-02-25 ENCOUNTER — Encounter (HOSPITAL_COMMUNITY): Payer: Self-pay

## 2021-02-25 ENCOUNTER — Other Ambulatory Visit: Payer: Self-pay

## 2021-02-25 DIAGNOSIS — C159 Malignant neoplasm of esophagus, unspecified: Secondary | ICD-10-CM | POA: Diagnosis not present

## 2021-02-25 DIAGNOSIS — K5730 Diverticulosis of large intestine without perforation or abscess without bleeding: Secondary | ICD-10-CM | POA: Diagnosis not present

## 2021-02-25 DIAGNOSIS — C3490 Malignant neoplasm of unspecified part of unspecified bronchus or lung: Secondary | ICD-10-CM | POA: Diagnosis not present

## 2021-02-25 DIAGNOSIS — C7951 Secondary malignant neoplasm of bone: Secondary | ICD-10-CM | POA: Diagnosis not present

## 2021-02-25 DIAGNOSIS — C3491 Malignant neoplasm of unspecified part of right bronchus or lung: Secondary | ICD-10-CM | POA: Diagnosis not present

## 2021-02-25 DIAGNOSIS — I2510 Atherosclerotic heart disease of native coronary artery without angina pectoris: Secondary | ICD-10-CM | POA: Diagnosis not present

## 2021-02-25 DIAGNOSIS — K7689 Other specified diseases of liver: Secondary | ICD-10-CM | POA: Diagnosis not present

## 2021-02-25 DIAGNOSIS — C155 Malignant neoplasm of lower third of esophagus: Secondary | ICD-10-CM | POA: Diagnosis not present

## 2021-02-25 MED ORDER — IOHEXOL 300 MG/ML  SOLN
100.0000 mL | Freq: Once | INTRAMUSCULAR | Status: AC | PRN
  Administered 2021-02-25: 100 mL via INTRAVENOUS

## 2021-03-04 NOTE — Progress Notes (Signed)
HEMATOLOGY ONCOLOGY PROGRESS NOTE  Date of service:  03/05/21    Patient Care Team: [name], PA-C as PCP - General (Physician Assistant) [name], MD as Consulting Physician (Hematology and Oncology)  CHIEF COMPLAINTS/PURPOSE OF CONSULTATION:  F/u for metastatic lung cancer and esophageal cancer  DIAGNOSIS:   #1 Metastatic non-small cell lung cancer with bilateral lung nodules and large metastatic lesion in the left Ilium. 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 Also had c diff colitis - treated   Current Treatment  1) Active surveillance 2) Xgeva 170m [place] q12weeks for bone metastases. 3) Sandostatin q4weeks for diarrhea   Previous Treatment  For metastatic lung cancer 1 Palliative radiation therapy to the large left ilium metastases 2. IV Nivolumab x 20 cycles (discontinued due to likely immune colitis) 3. Xgeva 1248mSC q4weeks for bone metastases.  For Esophageal adenocarcinoma S/p concurrent carbo/taxol + RT  HISTORY OF PRESENTING ILLNESS: (plz see my previous consultation for details of initial presentation)  INTERVAL HISTORY:   Ms. ToBlankenburgs presenting today for her scheduled follow-up for metastatic lung cancer, and adenocarcinoma of the esophagus. The patient's last visit with [place] on 01/08/2021. The pt reports that she is doing well overall.  The pt reports that if she exerts herself or bends over that she gets very SOB. This has worsened in the last two months. The pt said her PCP gave her an inhaler for this, but notes it does not help much. The pt notes she had bronchitis last month and was given a Z-Pak and this has since resolved. The pt notes she is gaining weight.  Of note since the patient's last visit, pt has had CT Chest/Abd/Pel on 02/25/2021, which revealed "1. New posterior right upper lobe 0.4 cm solid pulmonary nodule. Follow-up chest CT recommended in 3 months.  2. Otherwise stable exam with no additional potential findings of new or progressive metastatic disease in the chest, abdomen or pelvis. Stable chronic left iliac bone metastasis. 3. Stable mild circumferential wall thickening in the lower thoracic esophagus, with no discrete esophageal mass. 4. Chronic findings include: Stable dilated main pulmonary artery, suggesting chronic pulmonary arterial hypertension. Left main and 1 vessel coronary atherosclerosis. Moderate left colonic diverticulosis. Layering sludge versus tiny gallstones in the gallbladder with no evidence of acute cholecystitis. Aortic Atherosclerosis (ICD10-I70.0) and Emphysema (ICD10-J43.9)."  Lab results today 03/05/2021 of CBC w/diff and CMP is as follows: all values are WNL except for Hgb of 11.7, CO2 of 21, Albumin of 3.4, GFR est of 58. 03/05/2021 Iron of 82, Sat Ratio of 35. 03/05/2021 Ferritin of 69.  On review of systems, pt reports weight gain, SOB and denies cough, difficulty swallowing, cough while eating, food getting stuck, and any other symptoms.  MEDICAL HISTORY:  Past Medical History:  Diagnosis Date  . Barrett's esophagus   . Bone neoplasm 06/24/2015  . [facility]   metastatic poorly differentiated carcinoma. tumor left groin surgical removal with radiation tx.  . Cataract    BILATERAL  . Cigarette smoker two packs a day or less    Currently still smoking 2 PPD - Not interested in quitting at this time.  . Colitis 2017  . Colon polyps    hyperplastic, tubular adenomas, tubulovillous adenoma  . Cough, persistent    hx. lung cancer ? primary-being evaluated, unsure of primary site.  . Depression 06/24/2015  . Diverticulosis   . Emphysema of lung (  [place])   . Endometriosis    Hysterectomy with BSO at age 46 yrs  . Esophageal adenocarcinoma ([place]) 08/11/15   intramucosal  . Gastritis   . GERD (gastroesophageal reflux disease)   . H/O: pneumonia   . Hiatal hernia   . Hyperlipidemia   . Hypertension  06/24/2015   likely improved incidental to 40 lbs weight loss from her neoplasm. No Longer taking med for this as of 08-06-15  . IBS (irritable bowel syndrome)   . Pain    left hip-persistent"tumor of bone"-radiation tx. 10.  . Vitamin D deficiency disease    SURGICAL HISTORY: Past Surgical History:  Procedure Laterality Date  . ABDOMINAL HYSTERECTOMY    . BALLOON DILATION N/A 10/08/2019   Procedure: BALLOON DILATION;  Surgeon: [name], MD;  Location: [facility];  Service: Gastroenterology;  Laterality: N/A;  . BARTHOLIN GLAND CYST EXCISION  81 yo ago   Does not want if it was an infected cyst or tumor. Was soon as delivery  . BIOPSY  01/02/2019   Procedure: BIOPSY;  Surgeon: [name], MD;  Location: [facility];  Service: Gastroenterology;;  . CATARACT EXTRACTION    . COLONOSCOPY W/ POLYPECTOMY     multiple times - last done 09/2014 per patient.  . ESOPHAGOGASTRODUODENOSCOPY (EGD) WITH PROPOFOL N/A 08/11/2015   Procedure: ESOPHAGOGASTRODUODENOSCOPY (EGD) WITH PROPOFOL;  Surgeon: [name], MD;  Location: [facility];  Service: Gastroenterology;  Laterality: N/A;  . ESOPHAGOGASTRODUODENOSCOPY (EGD) WITH PROPOFOL N/A 01/02/2019   Procedure: ESOPHAGOGASTRODUODENOSCOPY (EGD) WITH PROPOFOL;  Surgeon: [name], MD;  Location: [facility];  Service: Gastroenterology;  Laterality: N/A;  . ESOPHAGOGASTRODUODENOSCOPY (EGD) WITH PROPOFOL N/A 10/08/2019   Procedure: ESOPHAGOGASTRODUODENOSCOPY (EGD) WITH PROPOFOL;  Surgeon: [name], MD;  Location: [facility];  Service: Gastroenterology;  Laterality: N/A;  . FLEXIBLE SIGMOIDOSCOPY N/A 06/24/2017   Procedure: FLEXIBLE SIGMOIDOSCOPY;  Surgeon: Manus Gunning, MD;  Location: [facility];  Service: Gastroenterology;  Laterality: N/A;  . GANGLION CYST EXCISION    . KNEE ARTHROSCOPY  age about 32 yrs  . TONSILLECTOMY    . TOTAL ABDOMINAL HYSTERECTOMY W/ BILATERAL SALPINGOOPHORECTOMY  at age 65 yrs   For endometriosis     SOCIAL HISTORY: Social History   Socioeconomic History  . Marital status: Widowed    Spouse name: Not on file  . Number of children: 2  . Years of education: Not on file  . Highest education level: Not on file  Occupational History  . Not on file  Tobacco Use  . Smoking status: Former Smoker    Packs/day: 1.00    Years: 60.00    Pack years: 60.00    Types: Cigarettes    Quit date: 12/05/2014    Years since quitting: 6.2  . Smokeless tobacco: Never Used  Vaping Use  . Vaping Use: Never used  Substance and Sexual Activity  . Alcohol use: No    Alcohol/week: 0.0 standard drinks  . Drug use: No  . Sexual activity: Not Currently  Other Topics Concern  . Not on file  Social History Narrative  . Not on file   Social Determinants of Health   Financial Resource Strain: Not on file  Food Insecurity: Not on file  Transportation Needs: Not on file  Physical Activity: Not on file  Stress: Not on file  Social Connections: Not on file  Intimate Partner Violence: Not on file    FAMILY HISTORY: Family History  Problem Relation Age of Onset  .  Colon cancer Brother   . Colon cancer Brother   . Stroke Mother   . Colon cancer Father   . Emphysema Father        smoked  . Breast cancer Daughter 40       ER/PR+ stage II    ALLERGIES:  is allergic to penicillins, remeron [mirtazapine], and latex. patient wonders if she has a penicillin allergy but notes that she is uncertain about this.  MEDICATIONS:  Current Outpatient Medications  Medication Sig Dispense Refill  . albuterol (VENTOLIN HFA) 108 (90 Base) MCG/ACT inhaler Inhale 2 puffs into the lungs every 6 (six) hours as needed for wheezing or shortness of breath. 8 g 2  . amLODipine (NORVASC) 5 MG tablet TAKE 1 TABLET BY MOUTH EVERY DAY 90 tablet 1  . azithromycin (ZITHROMAX) 250 MG tablet z-pack - take as directed for 5 days 6 tablet 0  . Biotin 5 MG CAPS Take 5 mg by mouth daily.    . busPIRone (BUSPAR) 10 MG tablet  TAKE 1 TABLET BY MOUTH THREE TIMES A DAY 270 tablet 0  . Calcium Citrate-Vitamin D (CALCIUM + D PO) Take 1 tablet by mouth daily.    . Cyanocobalamin (B-12) 2500 MCG TABS Take 2,500 mcg by mouth daily.    [name] FLUoxetine (PROZAC) 40 MG capsule TAKE 1 CAPSULE BY MOUTH EVERY DAY 90 capsule 1  . omeprazole (PRILOSEC) 40 MG capsule TAKE 1 CAPSULE BY MOUTH EVERY DAY BEFORE BREAKFAST 90 capsule 1  . traZODone (DESYREL) 50 MG tablet TAKE 1 TABLET BY MOUTH EVERYDAY AT BEDTIME 90 tablet 2  . XARELTO 20 MG TABS tablet TAKE 1 TABLET BY MOUTH DAILY WITH SUPPER. 30 tablet 2   No current facility-administered medications for this visit.    REVIEW OF SYSTEMS:   10 Point review of Systems was done is negative except as noted above.  PHYSICAL EXAMINATION: ECOG PERFORMANCE STATUS: 2 - Symptomatic, <50% confined to bed  Vitals:   03/05/21 1202  BP: (!) 134/56  Pulse: 64  Resp: 18  Temp: 97.7 F (36.5 C)  SpO2: 97%   Filed Weights   03/05/21 1202  Weight: 147 lb 11.2 oz (67 kg)  .  Wt Readings from Last 3 Encounters:  03/05/21 147 lb 11.2 oz (67 kg)  02/23/21 144 lb 14.4 oz (65.7 kg)  02/16/21 146 lb 11.2 oz (66.5 kg)     GENERAL:alert, in no acute distress and comfortable SKIN: no acute rashes, no significant lesions EYES: conjunctiva are pink and non-injected, sclera anicteric OROPHARYNX: MMM, no exudates, no oropharyngeal erythema or ulceration NECK: supple, no JVD LYMPH:  no palpable lymphadenopathy in the cervical, axillary or inguinal regions LUNGS: clear to auscultation b/l with normal respiratory effort HEART: regular rate & rhythm ABDOMEN:  normoactive bowel sounds , non tender, not distended. Extremity: no pedal edema PSYCH: alert & oriented x 3 with fluent speech NEURO: no focal motor/sensory deficits  LABORATORY DATA:  I have reviewed the data as listed  . CBC Latest Ref Rng & Units 03/05/2021 02/05/2021 01/08/2021  WBC 4.0 - 10.5 K/uL 7.8 9.2 5.8  Hemoglobin 12.0 - 15.0 g/dL  11.7(L) 12.0 11.6(L)  Hematocrit 36.0 - 46.0 % 36.5 37.4 36.3  Platelets 150 - 400 K/uL 256 210 187   ANC 1.8k . CMP Latest Ref Rng & Units 03/05/2021 02/05/2021 01/08/2021  Glucose 70 - 99 mg/dL 74 72 156(H)  BUN 8 - 23 mg/dL 17 15 15   Creatinine 0.44 - 1.00 mg/dL 0.98  1.04(H) 0.94  Sodium 135 - 145 mmol/L 140 137 139  Potassium 3.5 - 5.1 mmol/L 4.9 4.2 4.0  Chloride 98 - 111 mmol/L 109 105 107  CO2 22 - 32 mmol/L 21(L) 24 23  Calcium 8.9 - 10.3 mg/dL 9.0 9.0 9.0  Total Protein 6.5 - 8.1 g/dL 7.7 7.9 7.3  Total Bilirubin 0.3 - 1.2 mg/dL 0.3 0.3 0.3  Alkaline Phos 38 - 126 U/L 90 69 55  AST 15 - 41 U/L 28 21 22   ALT 0 - 44 U/L 23 11 9    Component     Latest Ref Rng & Units 01/08/2021  Iron     41 - 142 ug/dL 85  TIBC     236 - 444 ug/dL 233 (L)  Saturation Ratios     21 - 57 % 37  UIBC     120 - 384 ug/dL 148  Folate, Hemolysate     Not Estab. ng/mL 467.0  HCT     34.0 - 46.6 % 35.5  Folate, RBC     >498 ng/mL 1,315  Vitamin B12     180 - 914 pg/mL 566  Ferritin     11 - 307 ng/mL 58       01/02/19 Esophagus Biopsy:    RADIOGRAPHIC STUDIES:  .CT CHEST ABDOMEN PELVIS W CONTRAST  Result Date: 02/25/2021 CLINICAL DATA:  Metastatic lung cancer status post immunotherapy and left iliac bone radiation therapy completed 2017. Distal third esophageal cancer status post chemoradiation therapy. Restaging. EXAM: CT CHEST, ABDOMEN, AND PELVIS WITH CONTRAST TECHNIQUE: Multidetector CT imaging of the chest, abdomen and pelvis was performed following the standard protocol during bolus administration of intravenous contrast. CONTRAST:  179m OMNIPAQUE IOHEXOL 300 MG/ML  SOLN COMPARISON:  07/15/2020 CT chest, abdomen and pelvis. FINDINGS: CT CHEST FINDINGS Cardiovascular: Normal heart size. No significant pericardial effusion/thickening. Right internal jugular Port-A-Cath terminates in lower third of the SVC. Left main and left anterior descending coronary atherosclerosis. Atherosclerotic  nonaneurysmal thoracic aorta. Stable dilated main pulmonary artery (3.6 cm diameter). No central pulmonary emboli. Mediastinum/Nodes: No discrete thyroid nodules. Mild circumferential wall thickening in the lower thoracic esophagus is unchanged. No discrete esophageal mass. No pathologically enlarged axillary, mediastinal or hilar lymph nodes. Lungs/Pleura: No pneumothorax. No pleural effusion. Moderate centrilobular emphysema with mild diffuse bronchial wall thickening. No acute consolidative airspace disease or lung masses. Anterior left lower lobe 0.3 cm indistinct nodule (series 4/image 60), stable. New posterior right upper lobe 0.4 cm solid pulmonary nodule (series 4/image 72). No additional significant pulmonary nodules. Stable curvilinear parenchymal bands in basilar lower lobes bilaterally compatible with nonspecific scarring. Musculoskeletal: No aggressive appearing focal osseous lesions. Mild thoracic spondylosis. CT ABDOMEN PELVIS FINDINGS Hepatobiliary: Normal liver size. Scattered small simple liver cysts, largest 1.1 cm in the posterior left liver. A few subcentimeter hypodense scattered liver lesions are too small to characterize and are unchanged. No new liver lesions. Layering sludge versus tiny gallstones in the otherwise normal gallbladder with no gallbladder wall thickening or pericholecystic fluid. No biliary ductal dilatation. Pancreas: Normal, with no mass or duct dilation. Spleen: Normal size. No mass. Adrenals/Urinary Tract: Normal adrenals. No hydronephrosis. A few scattered subcentimeter hypodense renal cortical lesions in both kidneys are too small to characterize and are unchanged, considered benign. Normal bladder. Stomach/Bowel: Normal non-distended stomach. Normal caliber small bowel with no small bowel wall thickening. Normal appendix. Oral contrast transits to the distal colon. Moderate left colonic diverticulosis with no large bowel wall thickening or significant pericolonic  fat  stranding. Vascular/Lymphatic: Atherosclerotic nonaneurysmal abdominal aorta. Patent portal, splenic, hepatic and renal veins. No pathologically enlarged lymph nodes in the abdomen or pelvis. Reproductive: Status post hysterectomy, with no abnormal findings at the vaginal cuff. No adnexal mass. Other: No pneumoperitoneum, ascites or focal fluid collection. Musculoskeletal: Large expansile mixed lytic and sclerotic left iliac bone lesion with chronic incompletely healed pathologic fracture medially, not appreciably changed. No new focal osseous lesions. Mild lumbar spondylosis. IMPRESSION: 1. New posterior right upper lobe 0.4 cm solid pulmonary nodule. Follow-up chest CT recommended in 3 months. 2. Otherwise stable exam with no additional potential findings of new or progressive metastatic disease in the chest, abdomen or pelvis. Stable chronic left iliac bone metastasis. 3. Stable mild circumferential wall thickening in the lower thoracic esophagus, with no discrete esophageal mass. 4. Chronic findings include: Stable dilated main pulmonary artery, suggesting chronic pulmonary arterial hypertension. Left main and 1 vessel coronary atherosclerosis. Moderate left colonic diverticulosis. Layering sludge versus tiny gallstones in the gallbladder with no evidence of acute cholecystitis. Aortic Atherosclerosis (ICD10-I70.0) and Emphysema (ICD10-J43.9). Electronically Signed   By: Ilona Sorrel M.D.   On: 02/25/2021 19:10    ASSESSMENT & PLAN:   80 y.o. female with  #1 Metastatic poorly differentiated carcinoma with likely lung primary non-small cell lung cancer.   CT of the head with and without contrast showed no evidence of metastatic disease. EGFR blood test mutation analysis negative. CT chest abdomen pelvis 04/19/2016 shows no evidence of disease progression. Patient tolerated Nivolumab very well but was discontinued when she developed grade 2 Immune colitis. Has been off Nivolumab for >6 months  CT  chest abdomen pelvis on 06/24/2016 shows no evidence of new disease or progression of metastatic disease. CT chest abdomen pelvis 09/06/2016 shows 1. Mixed interval response to therapy. 2. There is a new left ventral chest wall lesion deep to the pectoralis musculature worrisome for metastatic disease. 3. Posterior lower lobe nodular densities are identified which may reflect areas of pulmonary metastasis. 4. Interval decrease in size of destructive lesion involving the left iliac bone.  CT chest abd pelvis 12/08/2016: Cystic mass involving the left ventral chest wall has resolved in the interval. Likely was a hematoma due to trauma. Interval increase in size of pleural base mass overlying the posterior and inferior left lower lobe. There is also a new left pleural effusion identified.  CT chest 02/01/2017: Residual irregular soft tissue thickening/volume loss and trace left pleural fluid at the base of the left hemithorax, overall improved in appearance from 12/08/2016. No measurable lesion.  CT chest 05/29/2017 shows no residual pleural based mass or significant pleural effusion in the left hemithorax. No evidence of thoracic metastatic disease. No evidence of progressive metastatic disease within the abdomen or pelvis. Mixed lytic and blastic lesion involving the left iliac bone and associated pathologic fracture are unchanged.   CT CAP 09/14/17 shows no new changes. She does have slight displacement of her fractured left iliac bone. Evidence of stable disease.   CT CAP 01/04/2018- No new or progressive metastatic disease. Stable large left iliac bone metastasis with associated chronic pathologic fracture.   CT chest/abd/pelvis done on 04/26/18 revealed Stable exam.  No new or progressive interval findings.  07/19/18 CT C/A/P revealed Stable exam.  No new or progressive interval findings. Large destructive left iliac lesion is similar to prior. Aortic Atherosclerosis and Emphysema.    11/06/18 CT C/A/P  revealed Similar appearance of large mixed lytic and sclerotic lesion in the left  ilium. No new metastatic lesions are otherwise noted elsewhere in the chest, abdomen or pelvis. 2. Interval development of thickening of the distal third of the esophagus. This is nonspecific, and could be related to underlying reflux esophagitis. However, if there is any clinical concern for Barrett's metaplasia or esophageal neoplasia, further evaluation with nonemergent endoscopy could be considered. 3. Aortic atherosclerosis, in addition to left main coronary artery disease. Assessment for potential risk factor modification, dietary therapy or pharmacologic therapy may be warranted, if clinically Indicated. 4. Diffuse bronchial wall thickening with mild to moderate centrilobular and paraseptal emphysema; imaging findings suggestive of underlying COPD. 5. Additional incidental findings, as above.  #2  Adenocarcinoma of the Esophagus  Barrett's esophagus 4cms in the distal esophagus with low and high-grade dysplasia  01/02/19 Surgical pathology revealed adenocarcinoma of the esophagus   01/25/19 PET/CT revealed Distal esophageal primary, without hypermetabolic metastatic disease. 2. Chronic left iliac metastasis, as before. 3. Hypermetabolism within and superficial to the right gluteal musculature is most likely related to trauma and/or injection sites. 4. Aortic atherosclerosis, coronary artery atherosclerosis and emphysema.  S/p concurrent Carboplatin and Taxol weekly with RT of 45 Gy in 25 fractions and 5.4 Gy boost, completed between 02/04/19 and 03/27/19  07/03/2019 PET skull base to thigh revealed "1. Interval response to therapy. Significant reduction in FDG uptake associated with distal esophageal mass. SUV max currently 2.61 versus 16.97 previously. 2. 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 Unchanged 3. Aortic Atherosclerosis (ICD10-I70.0) and Emphysema (ICD10-J43.9). Coronary artery  calcifications."  07/15/2020 CT C/A/P ([id number]) ([id number]) revealed "1. No evidence of new or progressive metastatic disease in the chest, abdomen or pelvis."  #3 diarrhea-  now resolved was previously. 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 She was previously on on Lialda, budesonide,probiotics and lomotil but not currently taking any of these. Plan -Continue Sandostatin every 4 weeks   #4 h/o DVT and PE  -continue on Xarelto - no issues with bleeding   #6 Dyspnea - resolved 03/14/19 ECHO revealed LV EF of 60-65% 03/06/19 CXR revealed clear lung fields, normal heart size 03/07/19 and 02/25/19 EKGs, no overt concern but some decreased QRS amplitude Did refer to pulmonology for further evaluation and lung function testing  Began steroid inhaler to mitigate possible inflammation in airway, could be some radiation related scarring and emphysema   PLAN: -Discussed pt labwork today, 03/05/2021; blood counts normal, mild anemia, iron labs normal, chemistries stable. -Discussed pt CT Chest/Abd/Pel on 02/25/2021; one new finding but very small nodule. Other results very stable. Some thickening shown. -Advised pt the new nodule found is most likely to be inflammatory due to recent bronchitis infection. This will resolve in a few months but is something to monitor in 3 months. -Advised pt that the thickening may not resolve due to scar tissue formation. -Recommended pt discuss getting a lunch function test with her PCP. -The pt shows no lab or clinical evidence of Metastatic Lung Cancer or Esophageal Adenocarcinoma at this time. -Will continue Sandostatin q4weeks and Xgeva q12weeks. The pt has no prohibitive toxicities. -Recommended pt try stationary bike for physical activity if more tolerable given hip pain. -Continue daily multivitamin.  -Continue 50K IU Vitamin D  weekly -Will get repeat CT C/A/P in 3 months 1 week prior to visit. -Will see back in 3 month with labs.   FOLLOW UP: -continue Sandostatin q4weeks plz  schedule next 4 doses -Xgeva every 12 weeks with labs - plz schedule next 3 doses. -portflush with each lab -CT chest 11 weeks -MD visit in 12weeks with portflush and labs    The total time spent in the appointment was 20 minutes and more than 50% was on counseling and direct patient cares.   All of the patient's questions were answered with apparent satisfaction. The patient knows to call the clinic with any problems, questions or concerns.   Sullivan Lone MD [facility]On[facility] Physician [facility]  (Office): [phone] (Work cell): [phone] (Fax): [phone]  I, Reinaldo Raddle, am acting as scribe for Dr. Sullivan Lone, MD.

## 2021-03-05 ENCOUNTER — Other Ambulatory Visit: Payer: Self-pay

## 2021-03-05 ENCOUNTER — Ambulatory Visit: Payer: Medicare Other

## 2021-03-05 ENCOUNTER — Other Ambulatory Visit: Payer: Self-pay | Admitting: Hematology

## 2021-03-05 ENCOUNTER — Other Ambulatory Visit: Payer: Medicare Other

## 2021-03-05 ENCOUNTER — Inpatient Hospital Stay: Payer: Medicare Other

## 2021-03-05 ENCOUNTER — Inpatient Hospital Stay: Payer: Medicare Other | Attending: Hematology

## 2021-03-05 ENCOUNTER — Inpatient Hospital Stay (HOSPITAL_BASED_OUTPATIENT_CLINIC_OR_DEPARTMENT_OTHER): Payer: Medicare Other | Admitting: Hematology

## 2021-03-05 VITALS — BP 134/56 | HR 64 | Temp 97.7°F | Resp 18 | Ht 68.0 in | Wt 147.7 lb

## 2021-03-05 DIAGNOSIS — Z87891 Personal history of nicotine dependence: Secondary | ICD-10-CM | POA: Diagnosis not present

## 2021-03-05 DIAGNOSIS — Z800 Family history of malignant neoplasm of digestive organs: Secondary | ICD-10-CM | POA: Diagnosis not present

## 2021-03-05 DIAGNOSIS — T451X5D Adverse effect of antineoplastic and immunosuppressive drugs, subsequent encounter: Secondary | ICD-10-CM | POA: Insufficient documentation

## 2021-03-05 DIAGNOSIS — C3491 Malignant neoplasm of unspecified part of right bronchus or lung: Secondary | ICD-10-CM

## 2021-03-05 DIAGNOSIS — C155 Malignant neoplasm of lower third of esophagus: Secondary | ICD-10-CM

## 2021-03-05 DIAGNOSIS — C3490 Malignant neoplasm of unspecified part of unspecified bronchus or lung: Secondary | ICD-10-CM | POA: Insufficient documentation

## 2021-03-05 DIAGNOSIS — K521 Toxic gastroenteritis and colitis: Secondary | ICD-10-CM

## 2021-03-05 DIAGNOSIS — Z95828 Presence of other vascular implants and grafts: Secondary | ICD-10-CM

## 2021-03-05 DIAGNOSIS — Z8501 Personal history of malignant neoplasm of esophagus: Secondary | ICD-10-CM | POA: Diagnosis not present

## 2021-03-05 DIAGNOSIS — Z803 Family history of malignant neoplasm of breast: Secondary | ICD-10-CM | POA: Diagnosis not present

## 2021-03-05 DIAGNOSIS — Z79899 Other long term (current) drug therapy: Secondary | ICD-10-CM | POA: Diagnosis not present

## 2021-03-05 DIAGNOSIS — R197 Diarrhea, unspecified: Secondary | ICD-10-CM

## 2021-03-05 DIAGNOSIS — Z7189 Other specified counseling: Secondary | ICD-10-CM

## 2021-03-05 DIAGNOSIS — Z923 Personal history of irradiation: Secondary | ICD-10-CM | POA: Diagnosis not present

## 2021-03-05 DIAGNOSIS — I10 Essential (primary) hypertension: Secondary | ICD-10-CM | POA: Insufficient documentation

## 2021-03-05 DIAGNOSIS — Z9221 Personal history of antineoplastic chemotherapy: Secondary | ICD-10-CM | POA: Diagnosis not present

## 2021-03-05 DIAGNOSIS — C7951 Secondary malignant neoplasm of bone: Secondary | ICD-10-CM | POA: Insufficient documentation

## 2021-03-05 DIAGNOSIS — Z7901 Long term (current) use of anticoagulants: Secondary | ICD-10-CM | POA: Insufficient documentation

## 2021-03-05 DIAGNOSIS — K529 Noninfective gastroenteritis and colitis, unspecified: Secondary | ICD-10-CM

## 2021-03-05 LAB — CBC WITH DIFFERENTIAL/PLATELET
Abs Immature Granulocytes: 0.03 10*3/uL (ref 0.00–0.07)
Basophils Absolute: 0.1 10*3/uL (ref 0.0–0.1)
Basophils Relative: 1 %
Eosinophils Absolute: 0.2 10*3/uL (ref 0.0–0.5)
Eosinophils Relative: 3 %
HCT: 36.5 % (ref 36.0–46.0)
Hemoglobin: 11.7 g/dL — ABNORMAL LOW (ref 12.0–15.0)
Immature Granulocytes: 0 %
Lymphocytes Relative: 21 %
Lymphs Abs: 1.6 10*3/uL (ref 0.7–4.0)
MCH: 28.8 pg (ref 26.0–34.0)
MCHC: 32.1 g/dL (ref 30.0–36.0)
MCV: 89.9 fL (ref 80.0–100.0)
Monocytes Absolute: 0.6 10*3/uL (ref 0.1–1.0)
Monocytes Relative: 8 %
Neutro Abs: 5.2 10*3/uL (ref 1.7–7.7)
Neutrophils Relative %: 67 %
Platelets: 256 10*3/uL (ref 150–400)
RBC: 4.06 MIL/uL (ref 3.87–5.11)
RDW: 15 % (ref 11.5–15.5)
WBC: 7.8 10*3/uL (ref 4.0–10.5)
nRBC: 0 % (ref 0.0–0.2)

## 2021-03-05 LAB — CMP (CANCER CENTER ONLY)
ALT: 23 U/L (ref 0–44)
AST: 28 U/L (ref 15–41)
Albumin: 3.4 g/dL — ABNORMAL LOW (ref 3.5–5.0)
Alkaline Phosphatase: 90 U/L (ref 38–126)
Anion gap: 10 (ref 5–15)
BUN: 17 mg/dL (ref 8–23)
CO2: 21 mmol/L — ABNORMAL LOW (ref 22–32)
Calcium: 9 mg/dL (ref 8.9–10.3)
Chloride: 109 mmol/L (ref 98–111)
Creatinine: 0.98 mg/dL (ref 0.44–1.00)
GFR, Estimated: 58 mL/min — ABNORMAL LOW (ref >60)
Glucose, Bld: 74 mg/dL (ref 70–99)
Potassium: 4.9 mmol/L (ref 3.5–5.1)
Sodium: 140 mmol/L (ref 135–145)
Total Bilirubin: 0.3 mg/dL (ref 0.3–1.2)
Total Protein: 7.7 g/dL (ref 6.5–8.1)

## 2021-03-05 LAB — IRON AND TIBC
Iron: 82 ug/dL (ref 41–142)
Saturation Ratios: 35 % (ref 21–57)
TIBC: 233 ug/dL — ABNORMAL LOW (ref 236–444)
UIBC: 150 ug/dL (ref 120–384)

## 2021-03-05 LAB — FERRITIN: Ferritin: 69 ng/mL (ref 11–307)

## 2021-03-05 MED ORDER — SODIUM CHLORIDE 0.9% FLUSH
10.0000 mL | Freq: Once | INTRAVENOUS | Status: AC
  Administered 2021-03-05: 10 mL
  Filled 2021-03-05: qty 10

## 2021-03-05 MED ORDER — DENOSUMAB 120 MG/1.7ML ~~LOC~~ SOLN
SUBCUTANEOUS | Status: AC
  Filled 2021-03-05: qty 1.7

## 2021-03-05 MED ORDER — HEPARIN SOD (PORK) LOCK FLUSH 100 UNIT/ML IV SOLN
500.0000 [IU] | Freq: Once | INTRAVENOUS | Status: AC
  Administered 2021-03-05: 500 [IU]
  Filled 2021-03-05: qty 5

## 2021-03-05 MED ORDER — DENOSUMAB 120 MG/1.7ML ~~LOC~~ SOLN
120.0000 mg | Freq: Once | SUBCUTANEOUS | Status: AC
Start: 2021-03-05 — End: 2021-03-05
  Administered 2021-03-05: 120 mg via SUBCUTANEOUS

## 2021-03-05 MED ORDER — OCTREOTIDE ACETATE 30 MG IM KIT
30.0000 mg | PACK | Freq: Once | INTRAMUSCULAR | Status: AC
  Administered 2021-03-05: 30 mg via INTRAMUSCULAR

## 2021-03-05 MED ORDER — OCTREOTIDE ACETATE 30 MG IM KIT
PACK | INTRAMUSCULAR | Status: AC
  Filled 2021-03-05: qty 1

## 2021-03-05 NOTE — Progress Notes (Unsigned)
cbc

## 2021-03-05 NOTE — Patient Instructions (Signed)
Implanted Port Insertion, Care After This sheet gives you information about how to care for yourself after your procedure. Your health care provider may also give you more specific instructions. If you have problems or questions, contact your health care provider. What can I expect after the procedure? After the procedure, it is common to have:  Discomfort at the port insertion site.  Bruising on the skin over the port. This should improve over 3-4 days. Follow these instructions at home: Port care  After your port is placed, you will get a manufacturer's information card. The card has information about your port. Keep this card with you at all times.  Take care of the port as told by your health care provider. Ask your health care provider if you or a family member can get training for taking care of the port at home. A home health care nurse may also take care of the port.  Make sure to remember what type of port you have. Incision care  Follow instructions from your health care provider about how to take care of your port insertion site. Make sure you: ? Wash your hands with soap and water before and after you change your bandage (dressing). If soap and water are not available, use hand sanitizer. ? Change your dressing as told by your health care provider. ? Leave stitches (sutures), skin glue, or adhesive strips in place. These skin closures may need to stay in place for 2 weeks or longer. If adhesive strip edges start to loosen and curl up, you may trim the loose edges. Do not remove adhesive strips completely unless your health care provider tells you to do that.  Check your port insertion site every day for signs of infection. Check for: ? Redness, swelling, or pain. ? Fluid or blood. ? Warmth. ? Pus or a bad smell.      Activity  Return to your normal activities as told by your health care provider. Ask your health care provider what activities are safe for you.  Do not  lift anything that is heavier than 10 lb (4.5 kg), or the limit that you are told, until your health care provider says that it is safe. General instructions  Take over-the-counter and prescription medicines only as told by your health care provider.  Do not take baths, swim, or use a hot tub until your health care provider approves. Ask your health care provider if you may take showers. You may only be allowed to take sponge baths.  Do not drive for 24 hours if you were given a sedative during your procedure.  Wear a medical alert bracelet in case of an emergency. This will tell any health care providers that you have a port.  Keep all follow-up visits as told by your health care provider. This is important. Contact a health care provider if:  You cannot flush your port with saline as directed, or you cannot draw blood from the port.  You have a fever or chills.  You have redness, swelling, or pain around your port insertion site.  You have fluid or blood coming from your port insertion site.  Your port insertion site feels warm to the touch.  You have pus or a bad smell coming from the port insertion site. Get help right away if:  You have chest pain or shortness of breath.  You have bleeding from your port that you cannot control. Summary  Take care of the port as told by your   health care provider. Keep the manufacturer's information card with you at all times.  Change your dressing as told by your health care provider.  Contact a health care provider if you have a fever or chills or if you have redness, swelling, or pain around your port insertion site.  Keep all follow-up visits as told by your health care provider. This information is not intended to replace advice given to you by your health care provider. Make sure you discuss any questions you have with your health care provider. Document Revised: 06/19/2018 Document Reviewed: 06/19/2018 Elsevier Patient Education   2021 Elsevier Inc.  

## 2021-03-09 ENCOUNTER — Telehealth: Payer: Self-pay | Admitting: Hematology

## 2021-03-09 NOTE — Telephone Encounter (Signed)
Scheduled follow-up appointment per 4/1 los. Patient is aware.

## 2021-03-17 ENCOUNTER — Ambulatory Visit (INDEPENDENT_AMBULATORY_CARE_PROVIDER_SITE_OTHER): Payer: Medicare Other | Admitting: Physician Assistant

## 2021-03-17 ENCOUNTER — Telehealth: Payer: Self-pay | Admitting: Physician Assistant

## 2021-03-17 ENCOUNTER — Encounter: Payer: Self-pay | Admitting: Physician Assistant

## 2021-03-17 ENCOUNTER — Other Ambulatory Visit: Payer: Self-pay

## 2021-03-17 VITALS — BP 122/66 | HR 77 | Temp 99.1°F | Ht 68.0 in | Wt 151.2 lb

## 2021-03-17 DIAGNOSIS — L259 Unspecified contact dermatitis, unspecified cause: Secondary | ICD-10-CM | POA: Diagnosis not present

## 2021-03-17 MED ORDER — TRIAMCINOLONE ACETONIDE 0.1 % EX CREA: 1.0000 "application " | TOPICAL_CREAM | Freq: Two times a day (BID) | CUTANEOUS | 0 refills | Status: DC

## 2021-03-17 MED ORDER — METHYLPREDNISOLONE ACETATE 40 MG/ML IJ SUSP
40.0000 mg | Freq: Once | INTRAMUSCULAR | Status: AC
  Administered 2021-03-17: 40 mg via INTRAMUSCULAR

## 2021-03-17 NOTE — Progress Notes (Signed)
Acute Office Visit  Subjective:    Patient ID: Cindy Byrd, female    DOB: December 13, 1940, 80 y.o.   MRN: [id number]  Chief Complaint  Patient presents with  . Herpes Zoster    HPI Patient is in today for ithchy rash on bilateral arms, hands, lower abdomen, right groin area and lower back for about 1.5 wk. States has been working outside and removing weeds. Has been applying hydrocortisone gel which has helped some. Denies pain, fever, chills, insect bites, n/v or joint pain. States unsure if poison ivy or possible Shingles.   Past Medical History:  Diagnosis Date  . Barrett's esophagus   . Bone neoplasm 06/24/2015  . Cancer [facility])    metastatic poorly differentiated carcinoma. tumor left groin surgical removal with radiation tx.  . Cataract    BILATERAL  . Cigarette smoker two packs a day or less    Currently still smoking 2 PPD - Not interested in quitting at this time.  . Colitis 2017  . Colon polyps    hyperplastic, tubular adenomas, tubulovillous adenoma  . Cough, persistent    hx. lung cancer ? primary-being evaluated, unsure of primary site.  . Depression 06/24/2015  . Diverticulosis   . Emphysema of lung ([place])   . Endometriosis    Hysterectomy with BSO at age 12 yrs  . Esophageal adenocarcinoma ([place]) 08/11/15   intramucosal  . Gastritis   . GERD (gastroesophageal reflux disease)   . H/O: pneumonia   . Hiatal hernia   . Hyperlipidemia   . Hypertension 06/24/2015   likely improved incidental to 40 lbs weight loss from her neoplasm. No Longer taking med for this as of 08-06-15  . IBS (irritable bowel syndrome)   . Pain    left hip-persistent"tumor of bone"-radiation tx. 10.  . Vitamin D deficiency disease     Past Surgical History:  Procedure Laterality Date  . ABDOMINAL HYSTERECTOMY    . BALLOON DILATION N/A 10/08/2019   Procedure: BALLOON DILATION;  Surgeon: [name], MD;  Location: [facility];  Service: Gastroenterology;  Laterality: N/A;  . BARTHOLIN  GLAND CYST EXCISION  80 yo ago   Does not want if it was an infected cyst or tumor. Was soon as delivery  . BIOPSY  01/02/2019   Procedure: BIOPSY;  Surgeon: [name], MD;  Location: [facility];  Service: Gastroenterology;;  . CATARACT EXTRACTION    . COLONOSCOPY W/ POLYPECTOMY     multiple times - last done 09/2014 per patient.  . ESOPHAGOGASTRODUODENOSCOPY (EGD) WITH PROPOFOL N/A 08/11/2015   Procedure: ESOPHAGOGASTRODUODENOSCOPY (EGD) WITH PROPOFOL;  Surgeon: [name], MD;  Location: [facility];  Service: Gastroenterology;  Laterality: N/A;  . ESOPHAGOGASTRODUODENOSCOPY (EGD) WITH PROPOFOL N/A 01/02/2019   Procedure: ESOPHAGOGASTRODUODENOSCOPY (EGD) WITH PROPOFOL;  Surgeon: [name], MD;  Location: [facility];  Service: Gastroenterology;  Laterality: N/A;  . ESOPHAGOGASTRODUODENOSCOPY (EGD) WITH PROPOFOL N/A 10/08/2019   Procedure: ESOPHAGOGASTRODUODENOSCOPY (EGD) WITH PROPOFOL;  Surgeon: [name], MD;  Location: [facility];  Service: Gastroenterology;  Laterality: N/A;  . FLEXIBLE SIGMOIDOSCOPY N/A 06/24/2017   Procedure: FLEXIBLE SIGMOIDOSCOPY;  Surgeon: Manus Gunning, MD;  Location: [facility];  Service: Gastroenterology;  Laterality: N/A;  . GANGLION CYST EXCISION    . KNEE ARTHROSCOPY  age about 51 yrs  . TONSILLECTOMY    . TOTAL ABDOMINAL HYSTERECTOMY W/ BILATERAL SALPINGOOPHORECTOMY  at age 56 yrs   For endometriosis    Family History  Problem Relation Age of  Onset  . Colon cancer Brother   . Colon cancer Brother   . Stroke Mother   . Colon cancer Father   . Emphysema Father        smoked  . Breast cancer Daughter 2       ER/PR+ stage II    Social History   Socioeconomic History  . Marital status: Widowed    Spouse name: Not on file  . Number of children: 2  . Years of education: Not on file  . Highest education level: Not on file  Occupational History  . Not on file  Tobacco Use  . Smoking status: Former Smoker    Packs/day:  1.00    Years: 60.00    Pack years: 60.00    Types: Cigarettes    Quit date: 12/05/2014    Years since quitting: 6.2  . Smokeless tobacco: Never Used  Vaping Use  . Vaping Use: Never used  Substance and Sexual Activity  . Alcohol use: No    Alcohol/week: 0.0 standard drinks  . Drug use: No  . Sexual activity: Not Currently  Other Topics Concern  . Not on file  Social History Narrative  . Not on file   Social Determinants of Health   Financial Resource Strain: Not on file  Food Insecurity: Not on file  Transportation Needs: Not on file  Physical Activity: Not on file  Stress: Not on file  Social Connections: Not on file  Intimate Partner Violence: Not on file    Outpatient Medications Prior to Visit  Medication Sig Dispense Refill  . albuterol (VENTOLIN HFA) 108 (90 Base) MCG/ACT inhaler Inhale 2 puffs into the lungs every 6 (six) hours as needed for wheezing or shortness of breath. 8 g 2  . amLODipine (NORVASC) 5 MG tablet TAKE 1 TABLET BY MOUTH EVERY DAY 90 tablet 1  . Biotin 5 MG CAPS Take 5 mg by mouth daily.    . busPIRone (BUSPAR) 10 MG tablet TAKE 1 TABLET BY MOUTH THREE TIMES A DAY 270 tablet 0  . Calcium Citrate-Vitamin D (CALCIUM + D PO) Take 1 tablet by mouth daily.    . Cyanocobalamin (B-12) 2500 MCG TABS Take 2,500 mcg by mouth daily.    [name] FLUoxetine (PROZAC) 40 MG capsule TAKE 1 CAPSULE BY MOUTH EVERY DAY 90 capsule 1  . omeprazole (PRILOSEC) 40 MG capsule TAKE 1 CAPSULE BY MOUTH EVERY DAY BEFORE BREAKFAST 90 capsule 1  . traZODone (DESYREL) 50 MG tablet TAKE 1 TABLET BY MOUTH EVERYDAY AT BEDTIME 90 tablet 2  . XARELTO 20 MG TABS tablet TAKE 1 TABLET BY MOUTH DAILY WITH SUPPER. 30 tablet 2  . azithromycin (ZITHROMAX) 250 MG tablet z-pack - take as directed for 5 days (Patient not taking: Reported on 03/17/2021) 6 tablet 0   No facility-administered medications prior to visit.    Allergies  Allergen Reactions  . Penicillins Other (See Comments)    Unknown;  childhood allergy Did it involve swelling of the face/tongue/throat, SOB, or low BP? Unknown Did it involve sudden or severe rash/hives, skin peeling, or any reaction on the inside of your mouth or nose? Unknown Did you need to seek medical attention at a hospital or doctor's office? Unknown When did it last happen?childhood allergy If all above answers are "NO", may proceed with cephalosporin use.   . Remeron [Mirtazapine] Other (See Comments)    nightmares  . Latex Rash    Review of Systems Review of Systems:  A fourteen  system review of systems was performed and found to be positive as per HPI.    Objective:    Physical Exam General:  Well Developed, well nourished, appropriate for stated age.  Neuro:  Alert and oriented,  extra-ocular muscles intact  HEENT:  Normocephalic, atraumatic, neck supple Skin:  Maculopapular rash at multiple sites with evidence of excoriations. Moderate rash with fine red spots of right groin area. Respiratory: Not using accessory muscles, speaking in full sentences- unlabored. Vascular:  Ext warm, no cyanosis apprec.; cap RF less 2 sec. Psych:  No HI/SI, judgement and insight good, Euthymic mood. Full Affect.   BP 122/66   Pulse 77   Temp 99.1 F (37.3 C)   Ht 5' 8" (1.727 m)   Wt 151 lb 3.2 oz (68.6 kg)   SpO2 97%   BMI 22.99 kg/m  Wt Readings from Last 3 Encounters:  03/17/21 151 lb 3.2 oz (68.6 kg)  03/05/21 147 lb 11.2 oz (67 kg)  02/23/21 144 lb 14.4 oz (65.7 kg)    Health Maintenance Due  Topic Date Due  . TETANUS/TDAP  Never done  . PNA vac Low Risk Adult (1 of 2 - PCV13) Never done  . COVID-19 Vaccine (2 - Pfizer risk 4-dose series) 07/30/2020    There are no preventive care reminders to display for this patient.   Lab Results  Component Value Date   TSH 2.740 12/13/2019   Lab Results  Component Value Date   WBC 7.8 03/05/2021   HGB 11.7 (L) 03/05/2021   HCT 36.5 03/05/2021   MCV 89.9 03/05/2021   PLT 256  03/05/2021   Lab Results  Component Value Date   NA 140 03/05/2021   K 4.9 03/05/2021   CHLORIDE 103 11/16/2017   CO2 21 (L) 03/05/2021   GLUCOSE 74 03/05/2021   BUN 17 03/05/2021   CREATININE 0.98 03/05/2021   BILITOT 0.3 03/05/2021   ALKPHOS 90 03/05/2021   AST 28 03/05/2021   ALT 23 03/05/2021   PROT 7.7 03/05/2021   ALBUMIN 3.4 (L) 03/05/2021   CALCIUM 9.0 03/05/2021   ANIONGAP 10 03/05/2021   EGFR >60 11/16/2017   GFR 59.23 (L) 10/12/2016   Lab Results  Component Value Date   CHOL 178 07/03/2020   Lab Results  Component Value Date   HDL 47 07/03/2020   Lab Results  Component Value Date   LDLCALC 108 (H) 07/03/2020   Lab Results  Component Value Date   TRIG 129 07/03/2020   Lab Results  Component Value Date   CHOLHDL 3.8 07/03/2020   Lab Results  Component Value Date   HGBA1C 5.5 12/13/2019       Assessment & Plan:   Problem List Items Addressed This Visit   None   Visit Diagnoses    Contact dermatitis, unspecified contact dermatitis type, unspecified trigger    -  Primary   Relevant Medications   triamcinolone cream (KENALOG) 0.1 %     Contact dermatitis, unspecified contact dermatitis type, unspecified trigger: -Discussed with patient less likely shingles due to rash appearance and characteristics. Likely came into contact with an irritant and has been scratching which has spread the rash. Will administer corticosteroid injection due to significant pruritis and widespread lesions and advised can apply topical corticosteroid. -Recommend to avoid scratching. -Follow up if symptoms fail to improve or worsen.    Meds ordered this encounter  Medications  . triamcinolone cream (KENALOG) 0.1 %    Sig: Apply 1 application topically  2 (two) times daily.    Dispense:  30 g    Refill:  0    Order Specific Question:   Supervising Provider    Answer:   Beatrice Lecher D [[id number]]     [name], PA-C

## 2021-03-17 NOTE — Telephone Encounter (Signed)
Patient has rash on waist and stomach, itching and painful. She believes she has shingles- plea

## 2021-03-17 NOTE — Patient Instructions (Signed)
Contact Dermatitis Dermatitis is redness, soreness, and swelling (inflammation) of the skin. Contact dermatitis is a reaction to certain substances that touch the skin. Many different substances can cause contact dermatitis. There are two types of contact dermatitis:  Irritant contact dermatitis. This type is caused by something that irritates your skin, such as having dry hands from washing them too often with soap. This type does not require previous exposure to the substance for a reaction to occur. This is the most common type.  Allergic contact dermatitis. This type is caused by a substance that you are allergic to, such as poison ivy. This type occurs when you have been exposed to the substance (allergen) and develop a sensitivity to it. Dermatitis may develop soon after your first exposure to the allergen, or it may not develop until the next time you are exposed and every time thereafter. What are the causes? Irritant contact dermatitis is most commonly caused by exposure to:  Makeup.  Soaps.  Detergents.  Bleaches.  Acids.  Metal salts, such as nickel. Allergic contact dermatitis is most commonly caused by exposure to:  Poisonous plants.  Chemicals.  Jewelry.  Latex.  Medicines.  Preservatives in products, such as clothing. What increases the risk? You are more likely to develop this condition if you have:  A job that exposes you to irritants or allergens.  Certain medical conditions, such as asthma or eczema. What are the signs or symptoms? Symptoms of this condition may occur on your body anywhere the irritant has touched you or is touched by you.  Symptoms include: ? Dryness or flaking. ? Redness. ? Cracks. ? Itching. ? Pain or a burning feeling. ? Blisters. ? Drainage of small amounts of blood or clear fluid from skin cracks. With allergic contact dermatitis, there may also be swelling in areas such as the eyelids, mouth, or genitals.   How is this  diagnosed? This condition is diagnosed with a medical history and physical exam.  A patch skin test may be performed to help determine the cause.  If the condition is related to your job, you may need to see an occupational medicine specialist. How is this treated? This condition is treated by checking for the cause of the reaction and protecting your skin from further contact. Treatment may also include:  Steroid creams or ointments. Oral steroid medicines may be needed in more severe cases.  Antibiotic medicines or antibacterial ointments, if a skin infection is present.  Antihistamine lotion or an antihistamine taken by mouth to ease itching.  A bandage (dressing). Follow these instructions at home: Skin care  Moisturize your skin as needed.  Apply cool compresses to the affected areas.  Try applying baking soda paste to your skin. Stir water into baking soda until it reaches a paste-like consistency.  Do not scratch your skin, and avoid friction to the affected area.  Avoid the use of soaps, perfumes, and dyes. Medicines  Take or apply over-the-counter and prescription medicines only as told by your health care provider.  If you were prescribed an antibiotic medicine, take or apply the antibiotic as told by your health care provider. Do not stop using the antibiotic even if your condition improves. Bathing  Try taking a bath with: ? Epsom salts. Follow the instructions on the packaging. You can get these at your local pharmacy or grocery store. ? Baking soda. Pour a small amount into the bath as directed by your health care provider. ? Colloidal oatmeal. Follow the instructions  on the packaging. You can get this at your local pharmacy or grocery store.  Bathe less frequently, such as every other day.  Bathe in lukewarm water. Avoid using hot water. Bandage care  If you were given a bandage (dressing), change it as told by your health care provider.  Wash your hands  with soap and water before and after you change your dressing. If soap and water are not available, use hand sanitizer. General instructions  Avoid the substance that caused your reaction. If you do not know what caused it, keep a journal to try to track what caused it. Write down: ? What you eat. ? What cosmetic products you use. ? What you drink. ? What you wear in the affected area. This includes jewelry.  Check the affected areas every day for signs of infection. Check for: ? More redness, swelling, or pain. ? More fluid or blood. ? Warmth. ? Pus or a bad smell.  Keep all follow-up visits as told by your health care provider. This is important. Contact a health care provider if:  Your condition does not improve with treatment.  Your condition gets worse.  You have signs of infection such as swelling, tenderness, redness, soreness, or warmth in the affected area.  You have a fever.  You have new symptoms. Get help right away if:  You have a severe headache, neck pain, or neck stiffness.  You vomit.  You feel very sleepy.  You notice red streaks coming from the affected area.  Your bone or joint underneath the affected area becomes painful after the skin has healed.  The affected area turns darker.  You have difficulty breathing. Summary  Dermatitis is redness, soreness, and swelling (inflammation) of the skin. Contact dermatitis is a reaction to certain substances that touch the skin.  Symptoms of this condition may occur on your body anywhere the irritant has touched you or is touched by you.  This condition is treated by figuring out what caused the reaction and protecting your skin from further contact. Treatment may also include medicines and skin care.  Avoid the substance that caused your reaction. If you do not know what caused it, keep a journal to try to track what caused it.  Contact a health care provider if your condition gets worse or you have signs  of infection such as swelling, tenderness, redness, soreness, or warmth in the affected area. This information is not intended to replace advice given to you by your health care provider. Make sure you discuss any questions you have with your health care provider. Document Revised: 03/13/2019 Document Reviewed: 06/06/2018 Elsevier Patient Education  [employer].

## 2021-04-02 ENCOUNTER — Inpatient Hospital Stay: Payer: Medicare Other

## 2021-04-02 ENCOUNTER — Other Ambulatory Visit: Payer: Self-pay

## 2021-04-02 ENCOUNTER — Other Ambulatory Visit: Payer: Medicare Other

## 2021-04-02 DIAGNOSIS — Z800 Family history of malignant neoplasm of digestive organs: Secondary | ICD-10-CM | POA: Diagnosis not present

## 2021-04-02 DIAGNOSIS — Z9221 Personal history of antineoplastic chemotherapy: Secondary | ICD-10-CM | POA: Diagnosis not present

## 2021-04-02 DIAGNOSIS — Z79899 Other long term (current) drug therapy: Secondary | ICD-10-CM | POA: Diagnosis not present

## 2021-04-02 DIAGNOSIS — Z87891 Personal history of nicotine dependence: Secondary | ICD-10-CM | POA: Diagnosis not present

## 2021-04-02 DIAGNOSIS — C7951 Secondary malignant neoplasm of bone: Secondary | ICD-10-CM | POA: Diagnosis not present

## 2021-04-02 DIAGNOSIS — I10 Essential (primary) hypertension: Secondary | ICD-10-CM | POA: Diagnosis not present

## 2021-04-02 DIAGNOSIS — Z803 Family history of malignant neoplasm of breast: Secondary | ICD-10-CM | POA: Diagnosis not present

## 2021-04-02 DIAGNOSIS — C3490 Malignant neoplasm of unspecified part of unspecified bronchus or lung: Secondary | ICD-10-CM

## 2021-04-02 DIAGNOSIS — C155 Malignant neoplasm of lower third of esophagus: Secondary | ICD-10-CM

## 2021-04-02 DIAGNOSIS — Z95828 Presence of other vascular implants and grafts: Secondary | ICD-10-CM

## 2021-04-02 DIAGNOSIS — R197 Diarrhea, unspecified: Secondary | ICD-10-CM

## 2021-04-02 DIAGNOSIS — Z7189 Other specified counseling: Secondary | ICD-10-CM

## 2021-04-02 DIAGNOSIS — Z7901 Long term (current) use of anticoagulants: Secondary | ICD-10-CM | POA: Diagnosis not present

## 2021-04-02 DIAGNOSIS — K521 Toxic gastroenteritis and colitis: Secondary | ICD-10-CM

## 2021-04-02 DIAGNOSIS — K529 Noninfective gastroenteritis and colitis, unspecified: Secondary | ICD-10-CM

## 2021-04-02 DIAGNOSIS — Z8501 Personal history of malignant neoplasm of esophagus: Secondary | ICD-10-CM | POA: Diagnosis not present

## 2021-04-02 DIAGNOSIS — T451X5D Adverse effect of antineoplastic and immunosuppressive drugs, subsequent encounter: Secondary | ICD-10-CM | POA: Diagnosis not present

## 2021-04-02 DIAGNOSIS — Z923 Personal history of irradiation: Secondary | ICD-10-CM | POA: Diagnosis not present

## 2021-04-02 LAB — CMP (CANCER CENTER ONLY)
ALT: 10 U/L (ref 0–44)
AST: 24 U/L (ref 15–41)
Albumin: 3.5 g/dL (ref 3.5–5.0)
Alkaline Phosphatase: 68 U/L (ref 38–126)
Anion gap: 8 (ref 5–15)
BUN: 17 mg/dL (ref 8–23)
CO2: 23 mmol/L (ref 22–32)
Calcium: 9.1 mg/dL (ref 8.9–10.3)
Chloride: 109 mmol/L (ref 98–111)
Creatinine: 1.22 mg/dL — ABNORMAL HIGH (ref 0.44–1.00)
GFR, Estimated: 45 mL/min — ABNORMAL LOW (ref >60)
Glucose, Bld: 83 mg/dL (ref 70–99)
Potassium: 4.7 mmol/L (ref 3.5–5.1)
Sodium: 140 mmol/L (ref 135–145)
Total Bilirubin: 0.2 mg/dL — ABNORMAL LOW (ref 0.3–1.2)
Total Protein: 7.7 g/dL (ref 6.5–8.1)

## 2021-04-02 LAB — CBC WITH DIFFERENTIAL (CANCER CENTER ONLY)
Abs Immature Granulocytes: 0.03 10*3/uL (ref 0.00–0.07)
Basophils Absolute: 0.1 10*3/uL (ref 0.0–0.1)
Basophils Relative: 1 %
Eosinophils Absolute: 0.3 10*3/uL (ref 0.0–0.5)
Eosinophils Relative: 3 %
HCT: 35.8 % — ABNORMAL LOW (ref 36.0–46.0)
Hemoglobin: 11.4 g/dL — ABNORMAL LOW (ref 12.0–15.0)
Immature Granulocytes: 0 %
Lymphocytes Relative: 18 %
Lymphs Abs: 1.5 10*3/uL (ref 0.7–4.0)
MCH: 29.1 pg (ref 26.0–34.0)
MCHC: 31.8 g/dL (ref 30.0–36.0)
MCV: 91.3 fL (ref 80.0–100.0)
Monocytes Absolute: 0.7 10*3/uL (ref 0.1–1.0)
Monocytes Relative: 8 %
Neutro Abs: 6.2 10*3/uL (ref 1.7–7.7)
Neutrophils Relative %: 70 %
Platelet Count: 234 10*3/uL (ref 150–400)
RBC: 3.92 MIL/uL (ref 3.87–5.11)
RDW: 15.3 % (ref 11.5–15.5)
WBC Count: 8.8 10*3/uL (ref 4.0–10.5)
nRBC: 0 % (ref 0.0–0.2)

## 2021-04-02 LAB — IRON AND TIBC
Iron: 84 ug/dL (ref 41–142)
Saturation Ratios: 33 % (ref 21–57)
TIBC: 258 ug/dL (ref 236–444)
UIBC: 173 ug/dL (ref 120–384)

## 2021-04-02 LAB — FERRITIN: Ferritin: 54 ng/mL (ref 11–307)

## 2021-04-02 LAB — VITAMIN B12: Vitamin B-12: 5416 pg/mL — ABNORMAL HIGH (ref 180–914)

## 2021-04-02 MED ORDER — OCTREOTIDE ACETATE 30 MG IM KIT
30.0000 mg | PACK | Freq: Once | INTRAMUSCULAR | Status: AC
  Administered 2021-04-02: 30 mg via INTRAMUSCULAR

## 2021-04-02 MED ORDER — OCTREOTIDE ACETATE 30 MG IM KIT
PACK | INTRAMUSCULAR | Status: AC
  Filled 2021-04-02: qty 1

## 2021-04-02 NOTE — Patient Instructions (Signed)
Implanted Port Insertion, Care After This sheet gives you information about how to care for yourself after your procedure. Your health care provider may also give you more specific instructions. If you have problems or questions, contact your health care provider. What can I expect after the procedure? After the procedure, it is common to have:  Discomfort at the port insertion site.  Bruising on the skin over the port. This should improve over 3-4 days. Follow these instructions at home: [facility] care  After your port is placed, you will get a manufacturer's information card. The card has information about your port. Keep this card with you at all times.  Take care of the port as told by your health care provider. Ask your health care provider if you or a family member can get training for taking care of the port at home. A home health care nurse may also take care of the port.  Make sure to remember what type of port you have. Incision care  Follow instructions from your health care provider about how to take care of your port insertion site. Make sure you: ? Wash your hands with soap and water before and after you change your bandage (dressing). If soap and water are not available, use hand sanitizer. ? Change your dressing as told by your health care provider. ? Leave stitches (sutures), skin glue, or adhesive strips in place. These skin closures may need to stay in place for 2 weeks or longer. If adhesive strip edges start to loosen and curl up, you may trim the loose edges. Do not remove adhesive strips completely unless your health care provider tells you to do that.  Check your port insertion site every day for signs of infection. Check for: ? Redness, swelling, or pain. ? Fluid or blood. ? Warmth. ? Pus or a bad smell.      Activity  Return to your normal activities as told by your health care provider. Ask your health care provider what activities are safe for you.  Do not  lift anything that is heavier than 10 lb (4.5 kg), or the limit that you are told, until your health care provider says that it is safe. General instructions  Take over-the-counter and prescription medicines only as told by your health care provider.  Do not take baths, swim, or use a hot tub until your health care provider approves. Ask your health care provider if you may take showers. You may only be allowed to take sponge baths.  Do not drive for 24 hours if you were given a sedative during your procedure.  Wear a medical alert bracelet in case of an emergency. This will tell any health care providers that you have a port.  Keep all follow-up visits as told by your health care provider. This is important. Contact a health care provider if:  You cannot flush your port with saline as directed, or you cannot draw blood from the port.  You have a fever or chills.  You have redness, swelling, or pain around your port insertion site.  You have fluid or blood coming from your port insertion site.  Your port insertion site feels warm to the touch.  You have pus or a bad smell coming from the port insertion site. Get help right away if:  You have chest pain or shortness of breath.  You have bleeding from your port that you cannot control. Summary  Take care of the port as told by your  health care provider. Keep the manufacturer's information card with you at all times.  Change your dressing as told by your health care provider.  Contact a health care provider if you have a fever or chills or if you have redness, swelling, or pain around your port insertion site.  Keep all follow-up visits as told by your health care provider. This information is not intended to replace advice given to you by your health care provider. Make sure you discuss any questions you have with your health care provider. Document Revised: 06/19/2018 Document Reviewed: 06/19/2018 Elsevier Patient Education   [place]. Octreotide injection solution What is this medicine? OCTREOTIDE (ok TREE oh tide) is used to reduce blood levels of growth hormone in patients with a condition called acromegaly. This medicine also reduces flushing and watery diarrhea caused by certain types of cancer. This medicine may be used for other purposes; ask your health care provider or pharmacist if you have questions. COMMON BRAND NAME(S): Leatha Gilding, Sandostatin What should I tell my health care provider before I take this medicine? They need to know if you have any of these conditions:  diabetes  gallbladder disease  kidney disease  liver disease  thyroid disease  an unusual or allergic reaction to octreotide, other medicines, foods, dyes, or preservatives  pregnant or trying to get pregnant  breast-feeding How should I use this medicine? This medicine is for injection under the skin or into a vein (only in emergency situations). It is usually given by a health care professional in a hospital or clinic setting. If you get this medicine at home, you will be taught how to prepare and give this medicine. Allow the injection solution to come to room temperature before use. Do not warm it artificially. Use exactly as directed. Take your medicine at regular intervals. Do not take your medicine more often than directed. It is important that you put your used needles and syringes in a special sharps container. Do not put them in a trash can. If you do not have a sharps container, call your pharmacist or healthcare provider to get one. Talk to your pediatrician regarding the use of this medicine in children. Special care may be needed. Overdosage: If you think you have taken too much of this medicine contact a poison control center or emergency room at once. NOTE: This medicine is only for you. Do not share this medicine with others. What if I miss a dose? If you miss a dose, take it as soon as you can. If it is  almost time for your next dose, take only that dose. Do not take double or extra doses. What may interact with this medicine?  bromocriptine  certain medicines for blood pressure, heart disease, irregular heartbeat  cyclosporine  diuretics  medicines for diabetes, including insulin  quinidine This list may not describe all possible interactions. Give your health care provider a list of all the medicines, herbs, non-prescription drugs, or dietary supplements you use. Also tell them if you smoke, drink alcohol, or use illegal drugs. Some items may interact with your medicine. What should I watch for while using this medicine? Visit your doctor or health care professional for regular checks on your progress. To help reduce irritation at the injection site, use a different site for each injection and make sure the solution is at room temperature before use. This medicine may cause decreases in blood sugar. Signs of low blood sugar include chills, cool, pale skin or cold sweats,  drowsiness, extreme hunger, fast heartbeat, headache, nausea, nervousness or anxiety, shakiness, trembling, unsteadiness, tiredness, or weakness. Contact your doctor or health care professional right away if you experience any of these symptoms. This medicine may increase blood sugar. Ask your healthcare provider if changes in diet or medicines are needed if you have diabetes. This medicine may cause a decrease in vitamin B12. You should make sure that you get enough vitamin B12 while you are taking this medicine. Discuss the foods you eat and the vitamins you take with your health care professional. What side effects may I notice from receiving this medicine? Side effects that you should report to your doctor or health care professional as soon as possible:  allergic reactions like skin rash, itching or hives, swelling of the face, lips, or tongue  fast, slow, or irregular heartbeat  right upper belly pain  severe  stomach pain  signs and symptoms of high blood sugar such as being more thirsty or hungry or having to urinate more than normal. You may also feel very tired or have blurry vision.  signs and symptoms of low blood sugar such as feeling anxious; confusion; dizziness; increased hunger; unusually weak or tired; increased sweating; shakiness; cold, clammy skin; irritable; headache; blurred vision; fast heartbeat; loss of consciousness  unusually weak or tired Side effects that usually do not require medical attention (report to your doctor or health care professional if they continue or are bothersome):  diarrhea  dizziness  gas  headache  nausea, vomiting  pain, redness, or irritation at site where injected  upset stomach This list may not describe all possible side effects. Call your doctor for medical advice about side effects. You may report side effects to FDA at 1-800-FDA-1088. Where should I keep my medicine? Keep out of the reach of children. Store in a refrigerator between 2 and 8 degrees C (36 and 46 degrees F). Protect from light. Allow to come to room temperature naturally. Do not use artificial heat. If protected from light, the injection may be stored at room temperature between 20 and 30 degrees C (70 and 86 degrees F) for 14 days. After the initial use, throw away any unused portion of a multiple dose vial after 14 days. Throw away unused portions of the ampules after use. NOTE: This sheet is a summary. It may not cover all possible information. If you have questions about this medicine, talk to your doctor, pharmacist, or health care provider.  2021 Elsevier/Gold Standard (2019-06-20 13:33:09)

## 2021-04-30 ENCOUNTER — Other Ambulatory Visit: Payer: Self-pay

## 2021-04-30 ENCOUNTER — Inpatient Hospital Stay: Payer: Medicare Other

## 2021-04-30 ENCOUNTER — Inpatient Hospital Stay: Payer: Medicare Other | Attending: Hematology

## 2021-04-30 ENCOUNTER — Other Ambulatory Visit: Payer: Medicare Other

## 2021-04-30 VITALS — BP 147/64 | HR 71 | Temp 98.6°F | Resp 17 | Ht 68.0 in | Wt 156.0 lb

## 2021-04-30 DIAGNOSIS — Z923 Personal history of irradiation: Secondary | ICD-10-CM | POA: Insufficient documentation

## 2021-04-30 DIAGNOSIS — K521 Toxic gastroenteritis and colitis: Secondary | ICD-10-CM | POA: Insufficient documentation

## 2021-04-30 DIAGNOSIS — Z8501 Personal history of malignant neoplasm of esophagus: Secondary | ICD-10-CM | POA: Diagnosis not present

## 2021-04-30 DIAGNOSIS — C7951 Secondary malignant neoplasm of bone: Secondary | ICD-10-CM | POA: Insufficient documentation

## 2021-04-30 DIAGNOSIS — Z9221 Personal history of antineoplastic chemotherapy: Secondary | ICD-10-CM | POA: Diagnosis not present

## 2021-04-30 DIAGNOSIS — C7800 Secondary malignant neoplasm of unspecified lung: Secondary | ICD-10-CM | POA: Diagnosis present

## 2021-04-30 LAB — COMPREHENSIVE METABOLIC PANEL
ALT: 10 U/L (ref 0–44)
AST: 24 U/L (ref 15–41)
Albumin: 3.5 g/dL (ref 3.5–5.0)
Alkaline Phosphatase: 57 U/L (ref 38–126)
Anion gap: 9 (ref 5–15)
BUN: 23 mg/dL (ref 8–23)
CO2: 25 mmol/L (ref 22–32)
Calcium: 9.2 mg/dL (ref 8.9–10.3)
Chloride: 106 mmol/L (ref 98–111)
Creatinine, Ser: 1.31 mg/dL — ABNORMAL HIGH (ref 0.44–1.00)
GFR, Estimated: 41 mL/min — ABNORMAL LOW (ref >60)
Glucose, Bld: 81 mg/dL (ref 70–99)
Potassium: 4.7 mmol/L (ref 3.5–5.1)
Sodium: 140 mmol/L (ref 135–145)
Total Bilirubin: 0.3 mg/dL (ref 0.3–1.2)
Total Protein: 7.5 g/dL (ref 6.5–8.1)

## 2021-04-30 LAB — CBC WITH DIFFERENTIAL (CANCER CENTER ONLY)
Abs Immature Granulocytes: 0.03 10*3/uL (ref 0.00–0.07)
Basophils Absolute: 0.1 10*3/uL (ref 0.0–0.1)
Basophils Relative: 1 %
Eosinophils Absolute: 0.2 10*3/uL (ref 0.0–0.5)
Eosinophils Relative: 3 %
HCT: 34.8 % — ABNORMAL LOW (ref 36.0–46.0)
Hemoglobin: 11.2 g/dL — ABNORMAL LOW (ref 12.0–15.0)
Immature Granulocytes: 0 %
Lymphocytes Relative: 18 %
Lymphs Abs: 1.5 10*3/uL (ref 0.7–4.0)
MCH: 29.8 pg (ref 26.0–34.0)
MCHC: 32.2 g/dL (ref 30.0–36.0)
MCV: 92.6 fL (ref 80.0–100.0)
Monocytes Absolute: 0.7 10*3/uL (ref 0.1–1.0)
Monocytes Relative: 8 %
Neutro Abs: 5.6 10*3/uL (ref 1.7–7.7)
Neutrophils Relative %: 70 %
Platelet Count: 200 10*3/uL (ref 150–400)
RBC: 3.76 MIL/uL — ABNORMAL LOW (ref 3.87–5.11)
RDW: 14.7 % (ref 11.5–15.5)
WBC Count: 8.1 10*3/uL (ref 4.0–10.5)
nRBC: 0 % (ref 0.0–0.2)

## 2021-04-30 MED ORDER — OCTREOTIDE ACETATE 30 MG IM KIT
PACK | INTRAMUSCULAR | Status: AC
  Filled 2021-04-30: qty 1

## 2021-04-30 MED ORDER — HEPARIN SOD (PORK) LOCK FLUSH 100 UNIT/ML IV SOLN
500.0000 [IU] | Freq: Once | INTRAVENOUS | Status: AC
  Administered 2021-04-30: 500 [IU]
  Filled 2021-04-30: qty 5

## 2021-04-30 MED ORDER — OCTREOTIDE ACETATE 30 MG IM KIT
30.0000 mg | PACK | Freq: Once | INTRAMUSCULAR | Status: AC
  Administered 2021-04-30: 30 mg via INTRAMUSCULAR

## 2021-04-30 MED ORDER — SODIUM CHLORIDE 0.9% FLUSH
10.0000 mL | Freq: Once | INTRAVENOUS | Status: AC
  Administered 2021-04-30: 10 mL
  Filled 2021-04-30: qty 10

## 2021-05-20 ENCOUNTER — Other Ambulatory Visit: Payer: Self-pay | Admitting: Hematology

## 2021-05-20 ENCOUNTER — Ambulatory Visit (HOSPITAL_COMMUNITY)
Admission: RE | Admit: 2021-05-20 | Discharge: 2021-05-20 | Disposition: A | Discharge status: Home / Self Care | Payer: Medicare Other | Source: Ambulatory Visit | Attending: Hematology | Admitting: Hematology

## 2021-05-20 ENCOUNTER — Other Ambulatory Visit: Payer: Self-pay

## 2021-05-20 DIAGNOSIS — C3490 Malignant neoplasm of unspecified part of unspecified bronchus or lung: Secondary | ICD-10-CM | POA: Diagnosis not present

## 2021-05-20 DIAGNOSIS — C155 Malignant neoplasm of lower third of esophagus: Secondary | ICD-10-CM | POA: Diagnosis not present

## 2021-05-20 DIAGNOSIS — C159 Malignant neoplasm of esophagus, unspecified: Secondary | ICD-10-CM | POA: Diagnosis not present

## 2021-05-20 DIAGNOSIS — D171 Benign lipomatous neoplasm of skin and subcutaneous tissue of trunk: Secondary | ICD-10-CM | POA: Diagnosis not present

## 2021-05-20 DIAGNOSIS — C3491 Malignant neoplasm of unspecified part of right bronchus or lung: Secondary | ICD-10-CM | POA: Diagnosis not present

## 2021-05-20 DIAGNOSIS — J439 Emphysema, unspecified: Secondary | ICD-10-CM | POA: Diagnosis not present

## 2021-05-27 ENCOUNTER — Other Ambulatory Visit: Payer: Self-pay | Admitting: *Deleted

## 2021-05-27 DIAGNOSIS — C155 Malignant neoplasm of lower third of esophagus: Secondary | ICD-10-CM

## 2021-05-28 ENCOUNTER — Other Ambulatory Visit: Payer: Medicare Other

## 2021-05-28 ENCOUNTER — Other Ambulatory Visit: Payer: Self-pay

## 2021-05-28 ENCOUNTER — Inpatient Hospital Stay: Payer: Medicare Other | Attending: Hematology

## 2021-05-28 ENCOUNTER — Inpatient Hospital Stay: Payer: Medicare Other

## 2021-05-28 ENCOUNTER — Ambulatory Visit: Payer: Medicare Other

## 2021-05-28 VITALS — BP 145/65 | HR 72 | Temp 97.7°F | Resp 18

## 2021-05-28 DIAGNOSIS — R197 Diarrhea, unspecified: Secondary | ICD-10-CM

## 2021-05-28 DIAGNOSIS — C3491 Malignant neoplasm of unspecified part of right bronchus or lung: Secondary | ICD-10-CM

## 2021-05-28 DIAGNOSIS — C3490 Malignant neoplasm of unspecified part of unspecified bronchus or lung: Secondary | ICD-10-CM | POA: Diagnosis not present

## 2021-05-28 DIAGNOSIS — C7951 Secondary malignant neoplasm of bone: Secondary | ICD-10-CM | POA: Diagnosis not present

## 2021-05-28 DIAGNOSIS — K521 Toxic gastroenteritis and colitis: Secondary | ICD-10-CM | POA: Insufficient documentation

## 2021-05-28 DIAGNOSIS — C155 Malignant neoplasm of lower third of esophagus: Secondary | ICD-10-CM

## 2021-05-28 DIAGNOSIS — Z95828 Presence of other vascular implants and grafts: Secondary | ICD-10-CM

## 2021-05-28 DIAGNOSIS — K529 Noninfective gastroenteritis and colitis, unspecified: Secondary | ICD-10-CM

## 2021-05-28 DIAGNOSIS — Z7189 Other specified counseling: Secondary | ICD-10-CM

## 2021-05-28 DIAGNOSIS — Z8501 Personal history of malignant neoplasm of esophagus: Secondary | ICD-10-CM | POA: Insufficient documentation

## 2021-05-28 LAB — CBC WITH DIFFERENTIAL (CANCER CENTER ONLY)
Abs Immature Granulocytes: 0.02 10*3/uL (ref 0.00–0.07)
Basophils Absolute: 0.1 10*3/uL (ref 0.0–0.1)
Basophils Relative: 1 %
Eosinophils Absolute: 0.2 10*3/uL (ref 0.0–0.5)
Eosinophils Relative: 3 %
HCT: 34.2 % — ABNORMAL LOW (ref 36.0–46.0)
Hemoglobin: 11.2 g/dL — ABNORMAL LOW (ref 12.0–15.0)
Immature Granulocytes: 0 %
Lymphocytes Relative: 16 %
Lymphs Abs: 1.2 10*3/uL (ref 0.7–4.0)
MCH: 30 pg (ref 26.0–34.0)
MCHC: 32.7 g/dL (ref 30.0–36.0)
MCV: 91.7 fL (ref 80.0–100.0)
Monocytes Absolute: 0.5 10*3/uL (ref 0.1–1.0)
Monocytes Relative: 7 %
Neutro Abs: 5.4 10*3/uL (ref 1.7–7.7)
Neutrophils Relative %: 73 %
Platelet Count: 193 10*3/uL (ref 150–400)
RBC: 3.73 MIL/uL — ABNORMAL LOW (ref 3.87–5.11)
RDW: 14 % (ref 11.5–15.5)
WBC Count: 7.4 10*3/uL (ref 4.0–10.5)
nRBC: 0 % (ref 0.0–0.2)

## 2021-05-28 LAB — CMP (CANCER CENTER ONLY)
ALT: 11 U/L (ref 0–44)
AST: 20 U/L (ref 15–41)
Albumin: 3.3 g/dL — ABNORMAL LOW (ref 3.5–5.0)
Alkaline Phosphatase: 55 U/L (ref 38–126)
Anion gap: 11 (ref 5–15)
BUN: 22 mg/dL (ref 8–23)
CO2: 22 mmol/L (ref 22–32)
Calcium: 8.8 mg/dL — ABNORMAL LOW (ref 8.9–10.3)
Chloride: 108 mmol/L (ref 98–111)
Creatinine: 1.23 mg/dL — ABNORMAL HIGH (ref 0.44–1.00)
GFR, Estimated: 44 mL/min — ABNORMAL LOW (ref >60)
Glucose, Bld: 129 mg/dL — ABNORMAL HIGH (ref 70–99)
Potassium: 4.3 mmol/L (ref 3.5–5.1)
Sodium: 141 mmol/L (ref 135–145)
Total Bilirubin: 0.3 mg/dL (ref 0.3–1.2)
Total Protein: 7.4 g/dL (ref 6.5–8.1)

## 2021-05-28 MED ORDER — OCTREOTIDE ACETATE 30 MG IM KIT
30.0000 mg | PACK | Freq: Once | INTRAMUSCULAR | Status: AC
  Administered 2021-05-28: 30 mg via INTRAMUSCULAR

## 2021-05-28 MED ORDER — DENOSUMAB 120 MG/1.7ML ~~LOC~~ SOLN
120.0000 mg | Freq: Once | SUBCUTANEOUS | Status: AC
  Administered 2021-05-28: 120 mg via SUBCUTANEOUS

## 2021-05-28 MED ORDER — SODIUM CHLORIDE 0.9% FLUSH
10.0000 mL | Freq: Once | INTRAVENOUS | Status: AC
  Administered 2021-05-28: 10 mL
  Filled 2021-05-28: qty 10

## 2021-05-28 MED ORDER — HEPARIN SOD (PORK) LOCK FLUSH 100 UNIT/ML IV SOLN
500.0000 [IU] | Freq: Once | INTRAVENOUS | Status: AC
Start: 2021-05-28 — End: 2021-05-28
  Administered 2021-05-28: 500 [IU]
  Filled 2021-05-28: qty 5

## 2021-05-28 MED ORDER — OCTREOTIDE ACETATE 30 MG IM KIT
PACK | INTRAMUSCULAR | Status: AC
  Filled 2021-05-28: qty 1

## 2021-05-28 MED ORDER — DENOSUMAB 120 MG/1.7ML ~~LOC~~ SOLN
SUBCUTANEOUS | Status: AC
  Filled 2021-05-28: qty 1.7

## 2021-05-28 NOTE — Patient Instructions (Signed)
Octreotide injection solution What is this medication? OCTREOTIDE (ok TREE oh tide) is used to reduce blood levels of growth hormone in patients with a condition called acromegaly. This medicine also reducesflushing and watery diarrhea caused by certain types of cancer. This medicine may be used for other purposes; ask your health care provider orpharmacist if you have questions. COMMON BRAND NAME(S): Leatha Gilding, Sandostatin What should I tell my care team before I take this medication? They need to know if you have any of these conditions: diabetes gallbladder disease kidney disease liver disease thyroid disease an unusual or allergic reaction to octreotide, other medicines, foods, dyes, or preservatives pregnant or trying to get pregnant breast-feeding How should I use this medication? This medicine is for injection under the skin or into a vein (only in emergency situations). It is usually given by a health care professional in a hospital orclinic setting. If you get this medicine at home, you will be taught how to prepare and give this medicine. Allow the injection solution to come to room temperature before use. Do not warm it artificially. Use exactly as directed. Take your medicineat regular intervals. Do not take your medicine more often than directed. It is important that you put your used needles and syringes in a special sharps container. Do not put them in a trash can. If you do not have a sharpscontainer, call your pharmacist or healthcare provider to get one. Talk to your pediatrician regarding the use of this medicine in children.Special care may be needed. Overdosage: If you think you have taken too much of this medicine contact apoison control center or emergency room at once. NOTE: This medicine is only for you. Do not share this medicine with others. What if I miss a dose? If you miss a dose, take it as soon as you can. If it is almost time for yournext dose, take only that  dose. Do not take double or extra doses. What may interact with this medication? bromocriptine certain medicines for blood pressure, heart disease, irregular heartbeat cyclosporine diuretics medicines for diabetes, including insulin quinidine This list may not describe all possible interactions. Give your health care provider a list of all the medicines, herbs, non-prescription drugs, or dietary supplements you use. Also tell them if you smoke, drink alcohol, or use illegaldrugs. Some items may interact with your medicine. What should I watch for while using this medication? Visit your doctor or health care professional for regular checks on yourprogress. To help reduce irritation at the injection site, use a different site for eachinjection and make sure the solution is at room temperature before use. This medicine may cause decreases in blood sugar. Signs of low blood sugar include chills, cool, pale skin or cold sweats, drowsiness, extreme hunger, fast heartbeat, headache, nausea, nervousness or anxiety, shakiness, trembling, unsteadiness, tiredness, or weakness. Contact your doctor or health careprofessional right away if you experience any of these symptoms. This medicine may increase blood sugar. Ask your healthcare provider if changesin diet or medicines are needed if you have diabetes. This medicine may cause a decrease in vitamin B12. You should make sure that you get enough vitamin B12 while you are taking this medicine. Discuss thefoods you eat and the vitamins you take with your health care professional. What side effects may I notice from receiving this medication? Side effects that you should report to your doctor or health care professionalas soon as possible: allergic reactions like skin rash, itching or hives, swelling of the face, lips, or  tongue fast, slow, or irregular heartbeat right upper belly pain severe stomach pain signs and symptoms of high blood sugar such as being  more thirsty or hungry or having to urinate more than normal. You may also feel very tired or have blurry vision. signs and symptoms of low blood sugar such as feeling anxious; confusion; dizziness; increased hunger; unusually weak or tired; increased sweating; shakiness; cold, clammy skin; irritable; headache; blurred vision; fast heartbeat; loss of consciousness unusually weak or tired Side effects that usually do not require medical attention (report to yourdoctor or health care professional if they continue or are bothersome): diarrhea dizziness gas headache nausea, vomiting pain, redness, or irritation at site where injected upset stomach This list may not describe all possible side effects. Call your doctor for medical advice about side effects. You may report side effects to FDA at1-800-FDA-1088. Where should I keep my medication? Keep out of the reach of children. Store in a refrigerator between 2 and 8 degrees C (36 and 46 degrees F). Protect from light. Allow to come to room temperature naturally. Do not use artificial heat. If protected from light, the injection may be stored at room temperature between 20 and 30 degrees C (70 and 86 degrees F) for 14 days. After the initial use, throw away any unused portion of a multiple dose vialafter 14 days. Throw away unused portions of the ampules after use. NOTE: This sheet is a summary. It may not cover all possible information. If you have questions about this medicine, talk to your doctor, pharmacist, orhealth care provider.  Denosumab injection What is this medication? DENOSUMAB (den oh sue mab) slows bone breakdown. Prolia is used to treat osteoporosis in women after menopause and in men, and in people who are taking corticosteroids for 6 months or more. Delton See is used to treat a high calcium level due to cancer and to prevent bone fractures and other bone problems caused by multiple myeloma or cancer bone metastases. Delton See is also used  totreat giant cell tumor of the bone. This medicine may be used for other purposes; ask your health care provider orpharmacist if you have questions. COMMON BRAND NAME(S): Prolia, XGEVA What should I tell my care team before I take this medication? They need to know if you have any of these conditions: dental disease having surgery or tooth extraction infection kidney disease low levels of calcium or Vitamin D in the blood malnutrition on hemodialysis skin conditions or sensitivity thyroid or parathyroid disease an unusual reaction to denosumab, other medicines, foods, dyes, or preservatives pregnant or trying to get pregnant breast-feeding How should I use this medication? This medicine is for injection under the skin. It is given by a health careprofessional in a hospital or clinic setting. A special MedGuide will be given to you before each treatment. Be sure to readthis information carefully each time. For Prolia, talk to your pediatrician regarding the use of this medicine in children. Special care may be needed. For Delton See, talk to your pediatrician regarding the use of this medicine in children. While this drug may be prescribed for children as young as 13 years for selected conditions,precautions do apply. Overdosage: If you think you have taken too much of this medicine contact apoison control center or emergency room at once. NOTE: This medicine is only for you. Do not share this medicine with others. What if I miss a dose? It is important not to miss your dose. Call your doctor or health careprofessional if you are  unable to keep an appointment. What may interact with this medication? Do not take this medicine with any of the following medications: other medicines containing denosumab This medicine may also interact with the following medications: medicines that lower your chance of fighting infection steroid medicines like prednisone or cortisone This list may not describe  all possible interactions. Give your health care provider a list of all the medicines, herbs, non-prescription drugs, or dietary supplements you use. Also tell them if you smoke, drink alcohol, or use illegaldrugs. Some items may interact with your medicine. What should I watch for while using this medication? Visit your doctor or health care professional for regular checks on your progress. Your doctor or health care professional may order blood tests andother tests to see how you are doing. Call your doctor or health care professional for advice if you get a fever, chills or sore throat, or other symptoms of a cold or flu. Do not treat yourself. This drug may decrease your body's ability to fight infection. Try toavoid being around people who are sick. You should make sure you get enough calcium and vitamin D while you are taking this medicine, unless your doctor tells you not to. Discuss the foods you eatand the vitamins you take with your health care professional. See your dentist regularly. Brush and floss your teeth as directed. Before youhave any dental work done, tell your dentist you are receiving this medicine. Do not become pregnant while taking this medicine or for 5 months after stopping it. Talk with your doctor or health care professional about your birth control options while taking this medicine. Women should inform their doctor if they wish to become pregnant or think they might be pregnant. There is a potential for serious side effects to an unborn child. Talk to your health careprofessional or pharmacist for more information. What side effects may I notice from receiving this medication? Side effects that you should report to your doctor or health care professionalas soon as possible: allergic reactions like skin rash, itching or hives, swelling of the face, lips, or tongue bone pain breathing problems dizziness jaw pain, especially after dental work redness, blistering, peeling of  the skin signs and symptoms of infection like fever or chills; cough; sore throat; pain or trouble passing urine signs of low calcium like fast heartbeat, muscle cramps or muscle pain; pain, tingling, numbness in the hands or feet; seizures unusual bleeding or bruising unusually weak or tired Side effects that usually do not require medical attention (report to yourdoctor or health care professional if they continue or are bothersome): constipation diarrhea headache joint pain loss of appetite muscle pain runny nose tiredness upset stomach This list may not describe all possible side effects. Call your doctor for medical advice about side effects. You may report side effects to FDA at1-800-FDA-1088. Where should I keep my medication? This medicine is only given in a clinic, doctor's office, or other health caresetting and will not be stored at home. NOTE: This sheet is a summary. It may not cover all possible information. If you have questions about this medicine, talk to your doctor, pharmacist, orhealth care provider.  2022 Elsevier/Gold Standard (2018-03-30 16:10:44)   2022 Elsevier/Gold Standard (2019-06-20 13:33:09)

## 2021-05-28 NOTE — Progress Notes (Signed)
MD Lorenso Courier confirmed pt can get sandostatin inj today and okay to give Xgeva with calcium of 8.8.

## 2021-05-31 ENCOUNTER — Other Ambulatory Visit: Payer: Self-pay | Admitting: Hematology

## 2021-05-31 DIAGNOSIS — C3490 Malignant neoplasm of unspecified part of unspecified bronchus or lung: Secondary | ICD-10-CM

## 2021-06-01 ENCOUNTER — Telehealth: Payer: Self-pay | Admitting: Hematology

## 2021-06-01 NOTE — Telephone Encounter (Signed)
Rescheduled upcoming appointment due to provider not in office. Patient is aware of changes. 

## 2021-06-01 NOTE — Telephone Encounter (Signed)
Left message with follow-up appointment.

## 2021-06-03 NOTE — Progress Notes (Signed)
HEMATOLOGY ONCOLOGY PROGRESS NOTE  Date of service:  06/04/21    Patient Care Team: [name], PA-C as PCP - General (Physician Assistant) [name], MD as Consulting Physician (Hematology and Oncology)  CHIEF COMPLAINTS/PURPOSE OF CONSULTATION:  F/u for metastatic lung cancer and esophageal cancer  DIAGNOSIS:   #1 Metastatic non-small cell lung cancer with bilateral lung nodules and large metastatic lesion in the left Ilium. 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 Also had c diff colitis - treated   Current Treatment  1) Active surveillance 2) Xgeva 163m [place] q12weeks for bone metastases. 3) Sandostatin q4weeks for diarrhea   Previous Treatment  For metastatic lung cancer 1 Palliative radiation therapy to the large left ilium metastases 2. IV Nivolumab x 20 cycles (discontinued due to likely immune colitis) 3. Xgeva 127mSC q4weeks for bone metastases.  For Esophageal adenocarcinoma S/p concurrent carbo/taxol + RT  HISTORY OF PRESENTING ILLNESS: (plz see my previous consultation for details of initial presentation)  INTERVAL HISTORY:  I connected with Cindy Byrd 06/04/2021 by telephone and verified that I am speaking with the correct person using two identifiers.   I discussed the limitations of evaluation and management by telemedicine. The patient expressed understanding and agreed to proceed.   Other persons participating in the visit and their role in the encounter:                                                         - RoReinaldo RaddleMedical Scribe     Patient's location: Home Provider's location: Home  Ms. ToBrighams presenting today for her scheduled follow-up for metastatic lung cancer, and adenocarcinoma of the esophagus. The patient's last visit with [place] on 04/012022. The pt reports that she is doing well overall.  The pt reports that she has been having  difficulty breathing and some throat issues. She notes this feels sore and like something is "always there". This sore throat has been persistent over the last month, slightly worsening. She has a productive cough that is clear or white in color. The patient notes she has a visit with her PCP in this month.  Of note since the patient's last visit, pt has had CT Chest wo contrast ([id number] 05/20/2021, which revealed "1. Interval resolution of the right upper lobe pulmonary nodule which was likely inflammatory. 2. No new pulmonary nodules or worrisome pulmonary lesions. 3. Stable emphysematous changes and pulmonary scarring. 4. No mediastinal or hilar mass or adenopathy. 5. Stable atherosclerotic calcifications involving the aorta and branch vessels including the coronary arteries. 6. Cholelithiasis. 7. Emphysema and aortic atherosclerosis."  Lab results 05/28/2021 of CBC w/diff and CMP is as follows: all values are WNL except for RBC of 3.73, Hgb of 11.2, HCT of 34.2, Glucose of 129, Creatinine of 1.23, Calcium of 8.8, Albumin of 3.3, GFR est of 44.  On review of systems, pt reports difficulty breathing, sore throat, SOB, productive cough and denies fever, difficulty swallowing, thrush, acid reflux, and any other symptoms.   MEDICAL HISTORY:  Past Medical History:  Diagnosis Date   Barrett's esophagus    Bone neoplasm 06/24/2015   [facility]   metastatic poorly differentiated carcinoma. tumor left groin surgical removal with radiation  tx.   Cataract    BILATERAL   Cigarette smoker two packs a day or less    Currently still smoking 2 PPD - Not interested in quitting at this time.   Colitis 2017   Colon polyps    hyperplastic, tubular adenomas, tubulovillous adenoma   Cough, persistent    hx. lung cancer ? primary-being evaluated, unsure of primary site.   Depression 06/24/2015   Diverticulosis    Emphysema of lung ([place])    Endometriosis    Hysterectomy with BSO at age 80 yrs    Esophageal adenocarcinoma ([place]) 08/11/15   intramucosal   Gastritis    GERD (gastroesophageal reflux disease)    H/O: pneumonia    Hiatal hernia    Hyperlipidemia    Hypertension 06/24/2015   likely improved incidental to 40 lbs weight loss from her neoplasm. No Longer taking med for this as of 08-06-15   IBS (irritable bowel syndrome)    Pain    left hip-persistent"tumor of bone"-radiation tx. 10.   Vitamin D deficiency disease    SURGICAL HISTORY: Past Surgical History:  Procedure Laterality Date   ABDOMINAL HYSTERECTOMY     BALLOON DILATION N/A 10/08/2019   Procedure: BALLOON DILATION;  Surgeon: [name], MD;  Location: [facility];  Service: Gastroenterology;  Laterality: N/A;   BARTHOLIN GLAND CYST EXCISION  80 yo ago   Does not want if it was an infected cyst or tumor. Was soon as delivery   BIOPSY  01/02/2019   Procedure: BIOPSY;  Surgeon: [name], MD;  Location: [facility];  Service: Gastroenterology;;   CATARACT EXTRACTION     COLONOSCOPY W/ POLYPECTOMY     multiple times - last done 09/2014 per patient.   ESOPHAGOGASTRODUODENOSCOPY (EGD) WITH PROPOFOL N/A 08/11/2015   Procedure: ESOPHAGOGASTRODUODENOSCOPY (EGD) WITH PROPOFOL;  Surgeon: [name], MD;  Location: [facility];  Service: Gastroenterology;  Laterality: N/A;   ESOPHAGOGASTRODUODENOSCOPY (EGD) WITH PROPOFOL N/A 01/02/2019   Procedure: ESOPHAGOGASTRODUODENOSCOPY (EGD) WITH PROPOFOL;  Surgeon: [name], MD;  Location: [facility];  Service: Gastroenterology;  Laterality: N/A;   ESOPHAGOGASTRODUODENOSCOPY (EGD) WITH PROPOFOL N/A 10/08/2019   Procedure: ESOPHAGOGASTRODUODENOSCOPY (EGD) WITH PROPOFOL;  Surgeon: [name], MD;  Location: [facility];  Service: Gastroenterology;  Laterality: N/A;   FLEXIBLE SIGMOIDOSCOPY N/A 06/24/2017   Procedure: FLEXIBLE SIGMOIDOSCOPY;  Surgeon: Manus Gunning, MD;  Location: [facility];  Service: Gastroenterology;  Laterality: N/A;   GANGLION CYST  EXCISION     KNEE ARTHROSCOPY  age about 38 yrs   TONSILLECTOMY     TOTAL ABDOMINAL HYSTERECTOMY W/ BILATERAL SALPINGOOPHORECTOMY  at age 8 yrs   For endometriosis    SOCIAL HISTORY: Social History   Socioeconomic History   Marital status: Widowed    Spouse name: Not on file   Number of children: 2   Years of education: Not on file   Highest education level: Not on file  Occupational History   Not on file  Tobacco Use   Smoking status: Former    Packs/day: 1.00    Years: 60.00    Pack years: 60.00    Types: Cigarettes    Quit date: 12/05/2014    Years since quitting: 6.5   Smokeless tobacco: Never  Vaping Use   Vaping Use: Never used  Substance and Sexual Activity   Alcohol use: No    Alcohol/week: 0.0 standard drinks   Drug use: No   Sexual activity: Not Currently  Other Topics Concern  Not on file  Social History Narrative   Not on file   Social Determinants of Health   Financial Resource Strain: Not on file  Food Insecurity: Not on file  Transportation Needs: Not on file  Physical Activity: Not on file  Stress: Not on file  Social Connections: Not on file  Intimate Partner Violence: Not on file    FAMILY HISTORY: Family History  Problem Relation Age of Onset   Colon cancer Brother    Colon cancer Brother    Stroke Mother    Colon cancer Father    Emphysema Father        smoked   Breast cancer Daughter 52       ER/PR+ stage II    ALLERGIES:  is allergic to penicillins, remeron [mirtazapine], and latex. patient wonders if she has a penicillin allergy but notes that she is uncertain about this.  MEDICATIONS:  Current Outpatient Medications  Medication Sig Dispense Refill   nystatin (MYCOSTATIN) 100000 UNIT/ML suspension Take 5 mLs (500,000 Units total) by mouth 4 (four) times daily. 200 mL 0   albuterol (VENTOLIN HFA) 108 (90 Base) MCG/ACT inhaler Inhale 2 puffs into the lungs every 6 (six) hours as needed for wheezing or shortness of breath. 8 g 2    amLODipine (NORVASC) 5 MG tablet TAKE 1 TABLET BY MOUTH EVERY DAY 90 tablet 1   azithromycin (ZITHROMAX) 250 MG tablet z-pack - take as directed for 5 days (Patient not taking: Reported on 03/17/2021) 6 tablet 0   Biotin 5 MG CAPS Take 5 mg by mouth daily.     busPIRone (BUSPAR) 10 MG tablet TAKE 1 TABLET BY MOUTH THREE TIMES A DAY 270 tablet 0   Calcium Citrate-Vitamin D (CALCIUM + D PO) Take 1 tablet by mouth daily.     Cyanocobalamin (B-12) 2500 MCG TABS Take 2,500 mcg by mouth daily.     FLUoxetine (PROZAC) 40 MG capsule TAKE 1 CAPSULE BY MOUTH EVERY DAY 90 capsule 1   omeprazole (PRILOSEC) 40 MG capsule TAKE 1 CAPSULE BY MOUTH EVERY DAY BEFORE BREAKFAST 90 capsule 1   traZODone (DESYREL) 50 MG tablet TAKE 1 TABLET BY MOUTH EVERYDAY AT BEDTIME 90 tablet 2   triamcinolone cream (KENALOG) 0.1 % Apply 1 application topically 2 (two) times daily. 30 g 0   XARELTO 20 MG TABS tablet TAKE 1 TABLET BY MOUTH DAILY WITH SUPPER 30 tablet 2   No current facility-administered medications for this visit.    REVIEW OF SYSTEMS:   10 Point review of Systems was done is negative except as noted above.  PHYSICAL EXAMINATION: ECOG PERFORMANCE STATUS: 2 - Symptomatic, <50% confined to bed  There were no vitals filed for this visit.  There were no vitals filed for this visit. .  Wt Readings from Last 3 Encounters:  04/30/21 156 lb (70.8 kg)  03/17/21 151 lb 3.2 oz (68.6 kg)  03/05/21 147 lb 11.2 oz (67 kg)   Telehealth Visit.  LABORATORY DATA:  I have reviewed the data as listed  . CBC Latest Ref Rng & Units 05/28/2021 04/30/2021 04/02/2021  WBC 4.0 - 10.5 K/uL 7.4 8.1 8.8  Hemoglobin 12.0 - 15.0 g/dL 11.2(L) 11.2(L) 11.4(L)  Hematocrit 36.0 - 46.0 % 34.2(L) 34.8(L) 35.8(L)  Platelets 150 - 400 K/uL 193 200 234   ANC 1.8k . CMP Latest Ref Rng & Units 05/28/2021 04/30/2021 04/02/2021  Glucose 70 - 99 mg/dL 129(H) 81 83  BUN 8 - 23 mg/dL 22 23 17  Creatinine 0.44 - 1.00 mg/dL 1.23(H) 1.31(H)  1.22(H)  Sodium 135 - 145 mmol/L 141 140 140  Potassium 3.5 - 5.1 mmol/L 4.3 4.7 4.7  Chloride 98 - 111 mmol/L 108 106 109  CO2 22 - 32 mmol/L _0 Calcium 8.9 - 10.3 mg/dL 8.8(L) 9.2 9.1  Total Protein 6.5 - 8.1 g/dL 7.4 7.5 7.7  Total Bilirubin 0.3 - 1.2 mg/dL 0.3 0.3 0.2(L)  Alkaline Phos 38 - 126 U/L 55 57 68  AST 15 - 41 U/L _1 ALT 0 - 44 U/L _2 Component     Latest Ref Rng & Units 01/08/2021  Iron     41 - 142 ug/dL 85  TIBC     236 - 444 ug/dL 233 (L)  Saturation Ratios     21 - 57 % 37  UIBC     120 - 384 ug/dL 148  Folate, Hemolysate     Not Estab. ng/mL 467.0  HCT     34.0 - 46.6 % 35.5  Folate, RBC     >498 ng/mL 1,315  Vitamin B12     180 - 914 pg/mL 566  Ferritin     11 - 307 ng/mL 58       01/02/19 Esophagus Biopsy:    RADIOGRAPHIC STUDIES:  .CT Chest Wo Contrast  Result Date: 05/20/2021 CLINICAL DATA:  Non-small cell lung cancer with bilateral pulmonary nodules. Patient also has esophageal cancer. EXAM: CT CHEST WITHOUT CONTRAST TECHNIQUE: Multidetector CT imaging of the chest was performed following the standard protocol without IV contrast. COMPARISON:  CT scan 02/25/2021 FINDINGS: Cardiovascular: The heart is normal in size. No pericardial effusion. Stable tortuosity and calcification of the thoracic aorta and stable scattered coronary artery calcifications. Stable lipomatous hypertrophy of the interatrial septum. Mediastinum/Nodes: Small scattered mediastinal and hilar lymph nodes but no mass or adenopathy. The esophagus is grossly normal. Stable mild uniform distal esophageal wall thickening without discrete mass. Lungs/Pleura: Interval resolution of the right upper lobe pulmonary nodule that was seen on the prior CT scan. There was likely inflammatory. No new pulmonary nodules or worrisome pulmonary lesions. Stable underlying emphysematous changes and pulmonary scarring. No pleural effusions or pleural lesions. Upper Abdomen: No  significant upper abdominal findings. Stable vascular calcifications. No obvious hepatic or adrenal gland lesions. Small gallstones are again noted layering in the gallbladder. Musculoskeletal: No breast masses, supraclavicular or axillary adenopathy. Thyroid gland is unremarkable. The bony thorax is intact. No worrisome bone lesions. IMPRESSION: 1. Interval resolution of the right upper lobe pulmonary nodule which was likely inflammatory. 2. No new pulmonary nodules or worrisome pulmonary lesions. 3. Stable emphysematous changes and pulmonary scarring. 4. No mediastinal or hilar mass or adenopathy. 5. Stable atherosclerotic calcifications involving the aorta and branch vessels including the coronary arteries. 6. Cholelithiasis. 7. Emphysema and aortic atherosclerosis. Aortic Atherosclerosis (ICD10-I70.0) and Emphysema (ICD10-J43.9). Electronically Signed   By: Marijo Sanes M.D.   On: 05/20/2021 16:10     ASSESSMENT & PLAN:   80 y.o. female with  #1 Metastatic poorly differentiated carcinoma with likely lung primary non-small cell lung cancer.    CT of the head with and without contrast showed no evidence of metastatic disease. EGFR blood test mutation analysis negative. CT chest abdomen pelvis 04/19/2016 shows no evidence of disease progression. Patient tolerated Nivolumab very well but was discontinued when she developed grade 2 Immune colitis. Has been off Nivolumab for >6 months  CT chest abdomen pelvis  on 06/24/2016 shows no evidence of new disease or progression of metastatic disease. CT chest abdomen pelvis 09/06/2016 shows 1. Mixed interval response to therapy. 2. There is a new left ventral chest wall lesion deep to the pectoralis musculature worrisome for metastatic disease. 3. Posterior lower lobe nodular densities are identified which may reflect areas of pulmonary metastasis. 4. Interval decrease in size of destructive lesion involving the left iliac bone.  CT chest abd pelvis 12/08/2016:  Cystic mass involving the left ventral chest wall has resolved in the interval. Likely was a hematoma due to trauma. Interval increase in size of pleural base mass overlying the posterior and inferior left lower lobe. There is also a new left pleural effusion identified.  CT chest 02/01/2017: Residual irregular soft tissue thickening/volume loss and trace left pleural fluid at the base of the left hemithorax, overall improved in appearance from 12/08/2016. No measurable lesion.  CT chest 05/29/2017 shows no residual pleural based mass or significant pleural effusion in the left hemithorax. No evidence of thoracic metastatic disease. No evidence of progressive metastatic disease within the abdomen or pelvis. Mixed lytic and blastic lesion involving the left iliac bone and associated pathologic fracture are unchanged.   CT CAP 09/14/17 shows no new changes. She does have slight displacement of her fractured left iliac bone. Evidence of stable disease.   CT CAP 01/04/2018- No new or progressive metastatic disease. Stable large left iliac bone metastasis with associated chronic pathologic fracture.   CT chest/abd/pelvis done on 04/26/18 revealed Stable exam.  No new or progressive interval findings.  07/19/18 CT C/A/P revealed Stable exam.  No new or progressive interval findings. Large destructive left iliac lesion is similar to prior. Aortic Atherosclerosis and Emphysema.    11/06/18 CT C/A/P revealed Similar appearance of large mixed lytic and sclerotic lesion in the left ilium. No new metastatic lesions are otherwise noted elsewhere in the chest, abdomen or pelvis. 2. Interval development of thickening of the distal third of the esophagus. This is nonspecific, and could be related to underlying reflux esophagitis. However, if there is any clinical concern for Barrett's metaplasia or esophageal neoplasia, further evaluation with nonemergent endoscopy could be considered. 3. Aortic atherosclerosis, in addition  to left main coronary artery disease. Assessment for potential risk factor modification, dietary therapy or pharmacologic therapy may be warranted, if clinically Indicated. 4. Diffuse bronchial wall thickening with mild to moderate centrilobular and paraseptal emphysema; imaging findings suggestive of underlying COPD. 5. Additional incidental findings, as above.  #2  Adenocarcinoma of the Esophagus  Barrett's esophagus 4cms in the distal esophagus with low and high-grade dysplasia  01/02/19 Surgical pathology revealed adenocarcinoma of the esophagus   01/25/19 PET/CT revealed Distal esophageal primary, without hypermetabolic metastatic disease. 2. Chronic left iliac metastasis, as before. 3. Hypermetabolism within and superficial to the right gluteal musculature is most likely related to trauma and/or injection sites. 4. Aortic atherosclerosis, coronary artery atherosclerosis and emphysema.  S/p concurrent Carboplatin and Taxol weekly with RT of 45 Gy in 25 fractions and 5.4 Gy boost, completed between 02/04/19 and 03/27/19  07/03/2019 PET skull base to thigh revealed "1. Interval response to therapy. Significant reduction in FDG uptake associated with distal esophageal mass. SUV max currently 2.61 versus 16.97 previously. 2. 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 Unchanged 3. Aortic Atherosclerosis (ICD10-I70.0) and Emphysema (ICD10-J43.9). Coronary artery calcifications."  07/15/2020 CT C/A/P ([id number]) ([id number]) revealed "1. No evidence of new or progressive metastatic disease in the chest, abdomen or  pelvis."  #3 diarrhea-  now resolved was previously. 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 She was previously on on Lialda, budesonide,probiotics and lomotil but not currently taking any of these. Plan -Continue Sandostatin every 4 weeks   #4 h/o DVT  and PE  -continue on Xarelto - no issues with bleeding   #6 Dyspnea - resolved 03/14/19 ECHO revealed LV EF of 60-65% 03/06/19 CXR revealed clear lung fields, normal heart size 03/07/19 and 02/25/19 EKGs, no overt concern but some decreased QRS amplitude Did refer to pulmonology for further evaluation and lung function testing  Began steroid inhaler to mitigate possible inflammation in airway, could be some radiation related scarring and emphysema   PLAN: -Discussed pt labwork, 05/28/2021; blood counts and chemistries normal. -Discussed pt CT Chest wo contrast ([id number]) on 05/20/2021; small nodule on last CT has resolved. Everything is normal. No new or overt findings. -Advised pt her throat symptoms could be due to mild fungal infection or irritation. -Recommended use of steam inhalation. -The pt shows no lab or clinical evidence of Metastatic Lung Cancer or Esophageal Adenocarcinoma at this time. -Will continue Sandostatin q4weeks and Xgeva q12weeks. The pt has no prohibitive toxicities. -Continue daily multivitamin.  -Continue 50K IU Vitamin D weekly -Will Rx Nystatin antifungal mouthwash. -Will see back in as scheduled in three weeks with labs.   FOLLOW UP: RTC w Dr Irene Limbo w labs as scheduled on 07/22    The total time spent in the appointment was 20 minutes and more than 50% was on counseling and direct patient cares.   All of the patient's questions were answered with apparent satisfaction. The patient knows to call the clinic with any problems, questions or concerns.    Sullivan Lone MD [place] Phys[facility]  (Office):       [phone] (Work cell):  [phone] (Fax):           [phone]  I, Reinaldo Raddle, am acting as scribe for Dr. Sullivan Lone, MD.     .I have reviewed the above documentation for accuracy and completeness, and I agree with the above. [name] MD

## 2021-06-04 ENCOUNTER — Other Ambulatory Visit: Payer: Self-pay

## 2021-06-04 ENCOUNTER — Inpatient Hospital Stay: Payer: Medicare Other | Attending: Hematology | Admitting: Hematology

## 2021-06-04 ENCOUNTER — Encounter: Payer: Self-pay | Admitting: Hematology

## 2021-06-04 ENCOUNTER — Ambulatory Visit: Payer: Medicare Other | Admitting: Hematology

## 2021-06-04 DIAGNOSIS — I10 Essential (primary) hypertension: Secondary | ICD-10-CM | POA: Insufficient documentation

## 2021-06-04 DIAGNOSIS — Z79899 Other long term (current) drug therapy: Secondary | ICD-10-CM | POA: Insufficient documentation

## 2021-06-04 DIAGNOSIS — Z923 Personal history of irradiation: Secondary | ICD-10-CM | POA: Insufficient documentation

## 2021-06-04 DIAGNOSIS — Z9221 Personal history of antineoplastic chemotherapy: Secondary | ICD-10-CM | POA: Insufficient documentation

## 2021-06-04 DIAGNOSIS — Z7901 Long term (current) use of anticoagulants: Secondary | ICD-10-CM | POA: Insufficient documentation

## 2021-06-04 DIAGNOSIS — C3491 Malignant neoplasm of unspecified part of right bronchus or lung: Secondary | ICD-10-CM

## 2021-06-04 DIAGNOSIS — C155 Malignant neoplasm of lower third of esophagus: Secondary | ICD-10-CM | POA: Diagnosis not present

## 2021-06-04 DIAGNOSIS — C159 Malignant neoplasm of esophagus, unspecified: Secondary | ICD-10-CM | POA: Insufficient documentation

## 2021-06-04 DIAGNOSIS — K521 Toxic gastroenteritis and colitis: Secondary | ICD-10-CM | POA: Insufficient documentation

## 2021-06-04 DIAGNOSIS — Z87891 Personal history of nicotine dependence: Secondary | ICD-10-CM | POA: Insufficient documentation

## 2021-06-04 DIAGNOSIS — C7951 Secondary malignant neoplasm of bone: Secondary | ICD-10-CM | POA: Insufficient documentation

## 2021-06-04 DIAGNOSIS — C3490 Malignant neoplasm of unspecified part of unspecified bronchus or lung: Secondary | ICD-10-CM | POA: Insufficient documentation

## 2021-06-04 DIAGNOSIS — Z86718 Personal history of other venous thrombosis and embolism: Secondary | ICD-10-CM | POA: Insufficient documentation

## 2021-06-04 DIAGNOSIS — Z86711 Personal history of pulmonary embolism: Secondary | ICD-10-CM | POA: Insufficient documentation

## 2021-06-04 MED ORDER — NYSTATIN 100000 UNIT/ML MT SUSP: 5.0000 mL | Freq: Four times a day (QID) | OROMUCOSAL | 0 refills | Status: DC

## 2021-06-10 ENCOUNTER — Encounter: Payer: Self-pay | Admitting: Hematology

## 2021-06-11 ENCOUNTER — Other Ambulatory Visit: Payer: Self-pay | Admitting: Hematology

## 2021-06-11 ENCOUNTER — Other Ambulatory Visit: Payer: Self-pay | Admitting: Physician Assistant

## 2021-06-11 DIAGNOSIS — F339 Major depressive disorder, recurrent, unspecified: Secondary | ICD-10-CM

## 2021-06-22 ENCOUNTER — Other Ambulatory Visit: Payer: Self-pay

## 2021-06-22 ENCOUNTER — Ambulatory Visit (INDEPENDENT_AMBULATORY_CARE_PROVIDER_SITE_OTHER): Payer: Medicare Other | Admitting: Physician Assistant

## 2021-06-22 ENCOUNTER — Ambulatory Visit: Payer: Medicare Other | Admitting: Physician Assistant

## 2021-06-22 ENCOUNTER — Encounter: Payer: Self-pay | Admitting: Physician Assistant

## 2021-06-22 VITALS — Ht 68.5 in | Wt 155.0 lb

## 2021-06-22 DIAGNOSIS — I10 Essential (primary) hypertension: Secondary | ICD-10-CM | POA: Diagnosis not present

## 2021-06-22 DIAGNOSIS — F411 Generalized anxiety disorder: Secondary | ICD-10-CM

## 2021-06-22 DIAGNOSIS — F330 Major depressive disorder, recurrent, mild: Secondary | ICD-10-CM | POA: Diagnosis not present

## 2021-06-22 DIAGNOSIS — J019 Acute sinusitis, unspecified: Secondary | ICD-10-CM | POA: Diagnosis not present

## 2021-06-22 MED ORDER — AMLODIPINE BESYLATE 5 MG PO TABS
5.0000 mg | ORAL_TABLET | Freq: Every day | ORAL | 1 refills | Status: DC
Start: 2021-06-22 — End: 2021-12-20

## 2021-06-22 MED ORDER — BENZONATATE 100 MG PO CAPS: 200.0000 mg | ORAL_CAPSULE | Freq: Three times a day (TID) | ORAL | 0 refills | Status: DC | PRN

## 2021-06-22 MED ORDER — AZITHROMYCIN 250 MG PO TABS: ORAL_TABLET | ORAL | 0 refills | Status: AC

## 2021-06-22 NOTE — Progress Notes (Signed)
Telehealth office visit note for Cindy Reid, PA-C- at Primary Care at [facility]   I connected with current patient today by telephone and verified that I am speaking with the correct person    Location of the patient: Home  Location of the provider: Office - This visit type was conducted due to national recommendations for restrictions regarding the COVID-19 Pandemic (e.g. social distancing) in an effort to limit this patient's exposure and mitigate transmission in our community.    - No physical exam could be performed with this format, beyond that communicated to [place] by the patient/ family members as noted.   - Additionally my office staff/ schedulers were to discuss with the patient that there may be a monetary charge related to this service, depending on their medical insurance.  My understanding is that patient understood and consented to proceed.     _________________________________________________________________________________   History of Present Illness: Patient calls in to follow-up on mood and hypertension.  Also has c/o productive cough with green, chest congestion, runny nose, and headache x 5 days. Patient has a sore throat which has been ongoing for several weeks and was started on nystatin mouthwash by her oncologist, denies worsening symptoms. Denies fever, chest pain, recent sick contact exposures, nausea, vomiting or diarrhea. Has been taking Motrin and Robitussin which had provided mild-moderate relief. Did an at-home COVID test today which resulted negative. Feels like shortness of breath has been worse than baseline with current illness, no wheezing. Patient was started on   Mood: Reports medication compliance with fluoxetine 40 mg usually takes buspirone 10 mg twice daily unless she is having a bad day and will take the extra third dose.  Feels like her mood has been stable.  Denies SI/HI.  HTN: Pt denies chest pain, palpitations, dizziness or edema. Taking  medication as directed without side effects, requesting refill.  Does not check blood pressure at home.     GAD 7 : Generalized Anxiety Score 06/22/2021 12/03/2018  Nervous, Anxious, on Edge 3 2  Control/stop worrying 2 0  Worry too much - different things 2 0  Trouble relaxing 1 0  Restless 0 0  Easily annoyed or irritable 0 0  Afraid - awful might happen 0 3  Total GAD 7 Score 8 5  Anxiety Difficulty - Not difficult at all    Depression screen [facility] 2/9 06/22/2021 03/17/2021 02/23/2021 02/16/2021 11/18/2020  Decreased Interest 1 0 1 1 3   Down, Depressed, Hopeless 0 0 1 1 2   PHQ - 2 Score 1 0 2 2 5   Altered sleeping 1 1 1 1 3   Tired, decreased energy 3 3 1 1 2   Change in appetite 0 1 0 0 1  Feeling bad or failure about yourself  0 0 0 0 0  Trouble concentrating 1 0 1 1 1   Moving slowly or fidgety/restless 0 0 1 0 0  Suicidal thoughts 0 0 0 0 0  PHQ-9 Score 6 5 6 5 12   Difficult doing work/chores - - - - -  Some recent data might be hidden      Impression and Recommendations:     1. Mild episode of recurrent major depressive disorder ([place])   2. Acute sinusitis, recurrence not specified, unspecified location   3. GAD (generalized anxiety disorder)   4. Essential hypertension      Mild episode of recurrent major depressive disorder, GAD: -PHQ-9 score of 6, GAD-7 score of 8, stable. -Continue  current medication regimen. -Will continue to monitor.  Essential hypertension: -Patient unable to obtain blood pressure today. -Reviewed prior BP readings in office and have been stable, recommend to continue with current medication regimen. Provided refill.  -Will continue to monitor.  Acute sinusitis:  -Patient has risk factors (current tx for metastatic lung cancer) for bacterial infection and at home Covid test resulted negative and symptoms have been ongoing for 5 days with minimal improvement so will start antibiotic therapy. Recommend to continue with home supportive care and  take tessalon Perles as needed for for cough.    - As part of my medical decision making, I reviewed the following data within the [place] History obtained from pt /family, CMA notes reviewed and incorporated if applicable, Labs reviewed, Radiograph/ tests reviewed if applicable and OV notes from prior OV's with me, as well as any other specialists she/he has seen since seeing me last, were all reviewed and used in my medical decision making process today.    - Additionally, when appropriate, discussion had with patient regarding our treatment plan, and their biases/concerns about that plan were used in my medical decision making today.    - The patient agreed with the plan and demonstrated an understanding of the instructions.   No barriers to understanding were identified.     - The patient was advised to call back or seek an in-person evaluation if the symptoms worsen or if the condition fails to improve as anticipated.   Return in about 4 months (around 10/23/2021) for [place] and [place].    No orders of the defined types were placed in this encounter.   Meds ordered this encounter  Medications   azithromycin (ZITHROMAX) 250 MG tablet    Sig: Take 2 tablets on day 1, then 1 tablet daily on days 2 through 5    Dispense:  6 tablet    Refill:  0    Order Specific Question:   Supervising Provider    Answer:   Beatrice Lecher D [[id number]]   benzonatate (TESSALON PERLES) 100 MG capsule    Sig: Take 2 capsules (200 mg total) by mouth 3 (three) times daily as needed for cough.    Dispense:  20 capsule    Refill:  0    Order Specific Question:   Supervising Provider    Answer:   Beatrice Lecher D [[id number]]   amLODipine (NORVASC) 5 MG tablet    Sig: Take 1 tablet (5 mg total) by mouth daily.    Dispense:  90 tablet    Refill:  1    Order Specific Question:   Supervising Provider    Answer:   Beatrice Lecher D [[id number]]     Medications Discontinued During This  Encounter  Medication Reason   azithromycin (ZITHROMAX) 250 MG tablet Completed Course   triamcinolone cream (KENALOG) 0.1 % Completed Course   amLODipine (NORVASC) 5 MG tablet Reorder       Time spent on telephone encounter was 15 minutes.    Note:  This note was prepared with assistance of Dragon voice recognition software. Occasional wrong-word or sound-a-like substitutions may have occurred due to the inherent limitations of voice recognition software.   The [place] was signed into law in 2016 which includes the topic of electronic health records.  This provides immediate access to information in MyChart.  This includes consultation notes, operative notes, office notes, lab results and pathology reports.  If you have any questions  about what you read please let us know at your next visit or call us at the office.  We are right here with you.   __________________________________________________________________________________     Patient Care Team    Relationship Specialty Notifications Start End  Cindy Byrd, [place] PCP - General Physician Assistant  06/30/20   [name], MD Consulting Physician Hematology and Oncology Admissions 06/24/15      -Vitals obtained; medications/ allergies reconciled;  personal medical, social, Sx etc.histories were updated by CMA, reviewed by me and are reflected in chart   Patient Active Problem List   Diagnosis Date Noted   Acute non-recurrent pansinusitis 02/23/2021   History of esophageal cancer    Esophageal dysphagia    Abnormal esophagram    DOE (dyspnea on exertion) 04/15/2019   Malignant neoplasm of distal third of esophagus ([place]) 01/14/2019   Counseling regarding advance care planning and goals of care 01/14/2019   Abnormal finding on imaging    Esophageal mass    GAD (generalized anxiety disorder) 10/24/2018   Hyperlipidemia 04/23/2018   Chronic kidney disease 04/23/2018   Elevated TSH 04/17/2018    Cough 02/21/2018   Elevated serum creatinine 02/21/2018   Health care maintenance 01/15/2018   Malignant neoplasm of lung ([place])    Colitis 06/23/2017   Left lower quadrant pain 06/23/2017   Fracture of left iliac crest ([place]) 06/23/2017   Metastatic lung cancer (metastasis from lung to other site), unspecified laterality ([place])    Hypocalcemia 09/08/2016   Vitamin D deficiency 09/08/2016   New onset a-fib ([place]) 09/08/2016   Chest pain    Thrush of mouth and esophagus ([place])    Protein-calorie malnutrition, severe ([place])    Colitis determined by colorectal biopsy    Bilateral pulmonary embolism ([place]) 09/07/2016   DVT of lower extremity, bilateral ([place]) 09/07/2016   Palliative care by specialist    Goals of care, counseling/discussion    Advance care planning    Diarrhea    Ulcerative pancolitis without complication ([place])    Cancer ([place])    Pressure injury of skin 09/02/2016   HCAP (healthcare-associated pneumonia) 09/01/2016   ARF (acute renal failure) ([place]) 09/01/2016   Nausea with vomiting 07/18/2016   Dehydration 07/18/2016   Hypokalemia 07/18/2016   Hypoalbuminemia due to protein-calorie malnutrition ([place]) 07/18/2016   Peripheral edema 07/18/2016   Diarrhea due to drug 05/19/2016   Port catheter in place 04/07/2016   Nicotine addiction 01/19/2016   Barrett's esophagus determined by biopsy 09/04/2015   Gastritis    Primary malignant neoplasm of lung metastatic to other site ([place]) 08/06/2015   Esophageal reflux 08/04/2015   Bone metastasis ([place]) 06/24/2015   Neoplasm related pain 06/24/2015   Protein calorie malnutrition ([place]) 06/24/2015   Anorexia 06/24/2015   Heavy smoker (more than 20 cigarettes per day) 06/24/2015   Depression 06/24/2015   HTN (hypertension) 06/24/2015     Current Meds  Medication Sig   albuterol (VENTOLIN HFA) 108 (90 Base) MCG/ACT inhaler Inhale 2 puffs into the lungs every 6 (six) hours as needed for wheezing or shortness of breath.    azithromycin (ZITHROMAX) 250 MG tablet Take 2 tablets on day 1, then 1 tablet daily on days 2 through 5   benzonatate (TESSALON PERLES) 100 MG capsule Take 2 capsules (200 mg total) by mouth 3 (three) times daily as needed for cough.   Biotin 5 MG CAPS Take 5 mg by mouth daily.   busPIRone (BUSPAR) 10 MG tablet TAKE 1 TABLET BY  MOUTH THREE TIMES A DAY   Calcium Citrate-Vitamin D (CALCIUM + D PO) Take 1 tablet by mouth daily.   Cyanocobalamin (B-12) 2500 MCG TABS Take 2,500 mcg by mouth daily.   FLUoxetine (PROZAC) 40 MG capsule TAKE 1 CAPSULE BY MOUTH EVERY DAY   nystatin (MYCOSTATIN) 100000 UNIT/ML suspension Take 5 mLs (500,000 Units total) by mouth 4 (four) times daily.   omeprazole (PRILOSEC) 40 MG capsule TAKE 1 CAPSULE BY MOUTH EVERY DAY BEFORE BREAKFAST   traZODone (DESYREL) 50 MG tablet TAKE 1 TABLET BY MOUTH EVERYDAY AT BEDTIME   XARELTO 20 MG TABS tablet TAKE 1 TABLET BY MOUTH DAILY WITH SUPPER   [DISCONTINUED] amLODipine (NORVASC) 5 MG tablet TAKE 1 TABLET BY MOUTH EVERY DAY     Allergies:  Allergies  Allergen Reactions   Penicillins Other (See Comments)    Unknown; childhood allergy Did it involve swelling of the face/tongue/throat, SOB, or low BP? Unknown Did it involve sudden or severe rash/hives, skin peeling, or any reaction on the inside of your mouth or nose? Unknown Did you need to seek medical attention at a hospital or doctor's office? Unknown When did it last happen?      childhood allergy If all above answers are "NO", may proceed with cephalosporin use.    Remeron [Mirtazapine] Other (See Comments)    nightmares   Latex Rash     ROS:  See above HPI for pertinent positives and negatives   Objective:   Height 5' 8.5" (1.74 m), weight 155 lb (70.3 kg).   (if some vitals are omitted, this means that patient was UNABLE to obtain them.) General: A & O * 3; sounds in no acute distress; in usual state of health.  Respiratory: speaking in full sentences, no  conversational dyspnea Psych: insight appears good, mood- appears full

## 2021-06-24 NOTE — Progress Notes (Signed)
HEMATOLOGY ONCOLOGY PROGRESS NOTE  Date of service:  06/24/21    Patient Care Team: [name], PA-C as PCP - General (Physician Assistant) [name], MD as Consulting Physician (Hematology and Oncology)  CHIEF COMPLAINTS/PURPOSE OF CONSULTATION:  F/u for metastatic lung cancer and esophageal cancer  DIAGNOSIS:   #1 Metastatic non-small cell lung cancer with bilateral lung nodules and large metastatic lesion in the left Ilium. 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 Also had c diff colitis - treated   Current Treatment  1) Active surveillance 2) Xgeva 150m Vowinckel q12weeks for bone metastases. 3) Sandostatin q4weeks for diarrhea   Previous Treatment  For metastatic lung cancer 1 Palliative radiation therapy to the large left ilium metastases 2. IV Nivolumab x 20 cycles (discontinued due to likely immune colitis) 3. Xgeva 1257mSC q4weeks for bone metastases.  For Esophageal adenocarcinoma S/p concurrent carbo/taxol + RT  HISTORY OF PRESENTING ILLNESS: (plz see my previous consultation for details of initial presentation)  INTERVAL HISTORY:   Cindy Byrd presenting today for her scheduled follow-up for metastatic lung cancer, and adenocarcinoma of the esophagus. The patient's last visit with [place] on 06/04/2021. The pt reports that she is doing well overall.  The pt reports no dysphagia or throat soreness. NO new shortness or breath or chestpain.  Lab results today 06/25/2021 of CBC w/diff and CMP is as follows: all values are WNL except for    MEDICAL HISTORY:  Past Medical History:  Diagnosis Date   Barrett's esophagus    Bone neoplasm [facility]/24/2015   [facility]   metastatic poorly differentiated carcinoma. tumor left groin surgical removal with radiation tx.   Cataract    BILATERAL   Cigarette smoker two packs a day or less    Currently still smoking 2 PPD - Not interested in  quitting at this time.   Colitis 2017   Colon polyps    hyperplastic, tubular adenomas, tubulovillous adenoma   Cough, persistent    hx. lung cancer ? primary-being evaluated, unsure of primary site.   Depression 06/24/2015   Diverticulosis    [place]   Endometriosis    Hysterectomy with BSO at age [age]rs   [facility]   intramucosal   Gastritis    GERD (gastroesophageal reflux disease)    H/O: pneumonia    Hiatal hernia    Hyperlipidemia    Hypertension 06/24/2015   likely improved incidental to 40 lbs weight loss from her neoplasm. No Longer taking med for this as of 08-06-15   IBS (irritable bowel syndrome)    Pain    left hip-persistent"tumor of bone"-radiation tx. 10.   Vitamin D deficiency disease    SURGICAL HISTORY: Past Surgical History:  Procedure Laterality Date   ABDOMINAL HYSTERECTOMY     BALLOON DILATION N/A 10/08/2019   Procedure: BALLOON DILATION;  Surgeon: PyJerene BearsMD;  Location: [facility];  Service: Gastroenterology;  Laterality: N/A;   BARTHOLIN GLAND CYST EXCISION  5287o ago   Does not want if it was an infected cyst or tumor. Was soon as delivery   BIOPSY  01/02/2019   Procedure: BIOPSY;  Surgeon: PyJerene BearsMD;  Location: [facility];  Service: Gastroenterology;;   CATARACT EXTRACTION     COLONOSCOPY W/ POLYPECTOMY     multiple times - last done 09/2014 per patient.   ESOPHAGOGASTRODUODENOSCOPY (EGD) WITH PROPOFOL  N/A 08/11/2015   Procedure: ESOPHAGOGASTRODUODENOSCOPY (EGD) WITH PROPOFOL;  Surgeon: [name], MD;  Location: [facility];  Service: Gastroenterology;  Laterality: N/A;   ESOPHAGOGASTRODUODENOSCOPY (EGD) WITH PROPOFOL N/A 01/02/2019   Procedure: ESOPHAGOGASTRODUODENOSCOPY (EGD) WITH PROPOFOL;  Surgeon: [name], MD;  Location: [facility];  Service: Gastroenterology;  Laterality: N/A;   ESOPHAGOGASTRODUODENOSCOPY (EGD) WITH PROPOFOL N/A 10/08/2019   Procedure:  ESOPHAGOGASTRODUODENOSCOPY (EGD) WITH PROPOFOL;  Surgeon: [name], MD;  Location: [facility];  Service: Gastroenterology;  Laterality: N/A;   FLEXIBLE SIGMOIDOSCOPY N/A 06/24/2017   Procedure: FLEXIBLE SIGMOIDOSCOPY;  Surgeon: Manus Gunning, MD;  Location: [facility];  Service: Gastroenterology;  Laterality: N/A;   GANGLION CYST EXCISION     KNEE ARTHROSCOPY  age about 89 yrs   TONSILLECTOMY     TOTAL ABDOMINAL HYSTERECTOMY W/ BILATERAL SALPINGOOPHORECTOMY  at age 11 yrs   For endometriosis    SOCIAL HISTORY: Social History   Socioeconomic History   Marital status: Widowed    Spouse name: Not on file   Number of children: 2   Years of education: Not on file   Highest education level: Not on file  Occupational History   Not on file  Tobacco Use   Smoking status: Former    Packs/day: 1.00    Years: 60.00    Pack years: 60.00    Types: Cigarettes    Quit date: 12/05/2014    Years since quitting: 6.5   Smokeless tobacco: Never  Vaping Use   Vaping Use: Never used  Substance and Sexual Activity   Alcohol use: No    Alcohol/week: 0.0 standard drinks   Drug use: No   Sexual activity: Not Currently  Other Topics Concern   Not on file  Social History Narrative   Not on file   Social Determinants of Health   Financial Resource Strain: Not on file  Food Insecurity: Not on file  Transportation Needs: Not on file  Physical Activity: Not on file  Stress: Not on file  Social Connections: Not on file  Intimate Partner Violence: Not on file    FAMILY HISTORY: Family History  Problem Relation Age of Onset   Colon cancer Brother    Colon cancer Brother    Stroke Mother    Colon cancer Father    Emphysema Father        smoked   Breast cancer Daughter 79       ER/PR+ stage II    ALLERGIES:  is allergic to penicillins, remeron [mirtazapine], and latex. patient wonders if she has a penicillin allergy but notes that she is uncertain about  this.  MEDICATIONS:  Current Outpatient Medications  Medication Sig Dispense Refill   albuterol (VENTOLIN HFA) 108 (90 Base) MCG/ACT inhaler Inhale 2 puffs into the lungs every 6 (six) hours as needed for wheezing or shortness of breath. 8 g 2   amLODipine (NORVASC) 5 MG tablet Take 1 tablet (5 mg total) by mouth daily. 90 tablet 1   azithromycin (ZITHROMAX) 250 MG tablet Take 2 tablets on day 1, then 1 tablet daily on days 2 through 5 6 tablet 0   benzonatate (TESSALON PERLES) 100 MG capsule Take 2 capsules (200 mg total) by mouth 3 (three) times daily as needed for cough. 20 capsule 0   Biotin 5 MG CAPS Take 5 mg by mouth daily.     busPIRone (BUSPAR) 10 MG tablet TAKE 1 TABLET BY MOUTH THREE TIMES A DAY 270 tablet 0  Calcium Citrate-Vitamin D (CALCIUM + D PO) Take 1 tablet by mouth daily.     Cyanocobalamin (B-12) 2500 MCG TABS Take 2,500 mcg by mouth daily.     FLUoxetine (PROZAC) 40 MG capsule TAKE 1 CAPSULE BY MOUTH EVERY DAY 90 capsule 1   nystatin (MYCOSTATIN) 100000 UNIT/ML suspension Take 5 mLs (500,000 Units total) by mouth 4 (four) times daily. 200 mL 0   omeprazole (PRILOSEC) 40 MG capsule TAKE 1 CAPSULE BY MOUTH EVERY DAY BEFORE BREAKFAST 90 capsule 1   traZODone (DESYREL) 50 MG tablet TAKE 1 TABLET BY MOUTH EVERYDAY AT BEDTIME 90 tablet 2   XARELTO 20 MG TABS tablet TAKE 1 TABLET BY MOUTH DAILY WITH SUPPER 30 tablet 2   No current facility-administered medications for this visit.    REVIEW OF SYSTEMS:   10 Point review of Systems was done is negative except as noted above.  PHYSICAL EXAMINATION: ECOG PERFORMANCE STATUS: 2 - Symptomatic, <50% confined to bed  There were no vitals filed for this visit.  There were no vitals filed for this visit. .  Wt Readings from Last 3 Encounters:  06/22/21 155 lb (70.3 kg)  04/30/21 156 lb (70.8 kg)  03/17/21 151 lb 3.2 oz (68.6 kg)   nad GENERAL:alert, in no acute distress and comfortable SKIN: no acute rashes, no  significant lesions EYES: conjunctiva are pink and non-injected, sclera anicteric OROPHARYNX: MMM, no exudates, no oropharyngeal erythema or ulceration NECK: supple, no JVD LYMPH:  no palpable lymphadenopathy in the cervical, axillary or inguinal regions LUNGS: clear to auscultation b/l with normal respiratory effort HEART: regular rate & rhythm ABDOMEN:  normoactive bowel sounds , non tender, not distended. Extremity: no pedal edema PSYCH: alert & oriented x 3 with fluent speech NEURO: no focal motor/sensory deficits  LABORATORY DATA:  I have reviewed the data as listed  . CBC Latest Ref Rng & Units 05/28/2021 04/30/2021 04/02/2021  WBC 4.0 - 10.5 K/uL 7.4 8.1 8.8  Hemoglobin 12.0 - 15.0 g/dL 11.2(L) 11.2(L) 11.4(L)  Hematocrit 36.0 - 46.0 % 34.2(L) 34.8(L) 35.8(L)  Platelets 150 - 400 K/uL 193 200 234   ANC 1.8k . CMP Latest Ref Rng & Units 05/28/2021 04/30/2021 04/02/2021  Glucose 70 - 99 mg/dL 129(H) 81 83  BUN 8 - 23 mg/dL 22 23 17   Creatinine 0.44 - 1.00 mg/dL 1.23(H) 1.31(H) 1.22(H)  Sodium 135 - 145 mmol/L 141 140 140  Potassium 3.5 - 5.1 mmol/L 4.3 4.7 4.7  Chloride 98 - 111 mmol/L 108 106 109  CO2 22 - 32 mmol/L 22 25 23   Calcium 8.9 - 10.3 mg/dL 8.8(L) 9.2 9.1  Total Protein 6.5 - 8.1 g/dL 7.4 7.5 7.7  Total Bilirubin 0.3 - 1.2 mg/dL 0.3 0.3 0.2(L)  Alkaline Phos 38 - 126 U/L 55 57 68  AST 15 - 41 U/L 20 24 24   ALT 0 - 44 U/L 11 10 10    Component     Latest Ref Rng & Units 01/08/2021  Iron     41 - 142 ug/dL 85  TIBC     236 - 444 ug/dL 233 (L)  Saturation Ratios     21 - 57 % 37  UIBC     120 - 384 ug/dL 148  Folate, Hemolysate     Not Estab. ng/mL 467.0  HCT     34.0 - 46.6 % 35.5  Folate, RBC     >498 ng/mL 1,315  Vitamin B12     180 - 914 pg/mL  566  Ferritin     11 - 307 ng/mL 58       01/02/19 Esophagus Biopsy:    RADIOGRAPHIC STUDIES:  .No results found.   ASSESSMENT & PLAN:   80 y.o. female with  #1 Metastatic poorly differentiated  carcinoma with likely lung primary non-small cell lung cancer.    CT of the head with and without contrast showed no evidence of metastatic disease. EGFR blood test mutation analysis negative. CT chest abdomen pelvis 04/19/2016 shows no evidence of disease progression. Patient tolerated Nivolumab very well but was discontinued when she developed grade 2 Immune colitis. Has been off Nivolumab for >6 months  CT chest abdomen pelvis on 06/24/2016 shows no evidence of new disease or progression of metastatic disease. CT chest abdomen pelvis 09/06/2016 shows 1. Mixed interval response to therapy. 2. There is a new left ventral chest wall lesion deep to the pectoralis musculature worrisome for metastatic disease. 3. Posterior lower lobe nodular densities are identified which may reflect areas of pulmonary metastasis. 4. Interval decrease in size of destructive lesion involving the left iliac bone.  CT chest abd pelvis 12/08/2016: Cystic mass involving the left ventral chest wall has resolved in the interval. Likely was a hematoma due to trauma. Interval increase in size of pleural base mass overlying the posterior and inferior left lower lobe. There is also a new left pleural effusion identified.  CT chest 02/01/2017: Residual irregular soft tissue thickening/volume loss and trace left pleural fluid at the base of the left hemithorax, overall improved in appearance from 12/08/2016. No measurable lesion.  CT chest 05/29/2017 shows no residual pleural based mass or significant pleural effusion in the left hemithorax. No evidence of thoracic metastatic disease. No evidence of progressive metastatic disease within the abdomen or pelvis. Mixed lytic and blastic lesion involving the left iliac bone and associated pathologic fracture are unchanged.   CT CAP 09/14/17 shows no new changes. She does have slight displacement of her fractured left iliac bone. Evidence of stable disease.   CT CAP 01/04/2018- No new or  progressive metastatic disease. Stable large left iliac bone metastasis with associated chronic pathologic fracture.   CT chest/abd/pelvis done on 04/26/18 revealed Stable exam.  No new or progressive interval findings.  07/19/18 CT C/A/P revealed Stable exam.  No new or progressive interval findings. Large destructive left iliac lesion is similar to prior. Aortic Atherosclerosis and Emphysema.    11/06/18 CT C/A/P revealed Similar appearance of large mixed lytic and sclerotic lesion in the left ilium. No new metastatic lesions are otherwise noted elsewhere in the chest, abdomen or pelvis. 2. Interval development of thickening of the distal third of the esophagus. This is nonspecific, and could be related to underlying reflux esophagitis. However, if there is any clinical concern for Barrett's metaplasia or esophageal neoplasia, further evaluation with nonemergent endoscopy could be considered. 3. Aortic atherosclerosis, in addition to left main coronary artery disease. Assessment for potential risk factor modification, dietary therapy or pharmacologic therapy may be warranted, if clinically Indicated. 4. Diffuse bronchial wall thickening with mild to moderate centrilobular and paraseptal emphysema; imaging findings suggestive of underlying COPD. 5. Additional incidental findings, as above.  #2  Adenocarcinoma of the Esophagus  Barrett's esophagus 4cms in the distal esophagus with low and high-grade dysplasia  01/02/19 Surgical pathology revealed adenocarcinoma of the esophagus   01/25/19 PET/CT revealed Distal esophageal primary, without hypermetabolic metastatic disease. 2. Chronic left iliac metastasis, as before. 3. Hypermetabolism within and superficial to the right  gluteal musculature is most likely related to trauma and/or injection sites. 4. Aortic atherosclerosis, coronary artery atherosclerosis and emphysema.  S/p concurrent Carboplatin and Taxol weekly with RT of 45 Gy in 25 fractions and 5.4 Gy  boost, completed between 02/04/19 and 03/27/19  07/03/2019 PET skull base to thigh revealed "1. Interval response to therapy. Significant reduction in FDG uptake associated with distal esophageal mass. SUV max currently 2.61 versus 16.97 previously. 2. 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 Unchanged 3. Aortic Atherosclerosis (ICD10-I70.0) and Emphysema (ICD10-J43.9). Coronary artery calcifications."  07/15/2020 CT C/A/P ([id number]) ([id number]) revealed "1. No evidence of new or progressive metastatic disease in the chest, abdomen or pelvis."  #3 diarrhea-  now resolved was previously. 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 She was previously on on Lialda, budesonide,probiotics and lomotil but not currently taking any of these. Plan -Continue Sandostatin every 4 weeks   #4 h/o DVT and PE  -continue on Xarelto - no issues with bleeding   #6 Dyspnea - resolved 03/14/19 ECHO revealed LV EF of 60-65% 03/06/19 CXR revealed clear lung fields, normal heart size 03/07/19 and 02/25/19 EKGs, no overt concern but some decreased QRS amplitude Did refer to pulmonology for further evaluation and lung function testing  Began steroid inhaler to mitigate possible inflammation in airway, could be some radiation related scarring and emphysema   PLAN: -Discussed pt labwork today, 06/25/2021; reviewed with patient -The pt shows no lab or clinical evidence of Metastatic Lung Cancer or Esophageal Adenocarcinoma at this time. -Will continue Sandostatin q4weeks and Xgeva q12weeks. The pt has no prohibitive toxicities. -Continue daily multivitamin.  -Continue 50K IU Vitamin D weekly  FOLLOW UP: -continue Sandostatin q4weeks plz schedule next 4 doses -Xgeva every 12 weeks with labs - plz schedule next 3 doses. -MD visit in 12weeks with portflush and labs    The total time  spent in the appointment was 23mnutes and more than 50% was on counseling and direct patient cares.   All of the patient's questions were answered with apparent satisfaction. The patient knows to call the clinic with any problems, questions or concerns.    GSullivan LoneMD MS Hematology/Oncology Physician [facility] (Office):       [phone](Work cell):  [phone](Fax):           [phone] I, RReinaldo Raddle am acting as scribe for Dr. GSullivan Lone MD.  ..gkdse  Erythromycin 20 .GBrunetta GeneraMD

## 2021-06-25 ENCOUNTER — Inpatient Hospital Stay: Payer: Medicare Other

## 2021-06-25 ENCOUNTER — Other Ambulatory Visit: Payer: Self-pay

## 2021-06-25 ENCOUNTER — Other Ambulatory Visit: Payer: Medicare Other

## 2021-06-25 ENCOUNTER — Inpatient Hospital Stay (HOSPITAL_BASED_OUTPATIENT_CLINIC_OR_DEPARTMENT_OTHER): Payer: Medicare Other | Admitting: Hematology

## 2021-06-25 ENCOUNTER — Ambulatory Visit: Payer: Medicare Other

## 2021-06-25 VITALS — BP 134/71 | HR 64 | Temp 98.8°F | Resp 18 | Wt 149.6 lb

## 2021-06-25 DIAGNOSIS — Z7189 Other specified counseling: Secondary | ICD-10-CM

## 2021-06-25 DIAGNOSIS — Z79899 Other long term (current) drug therapy: Secondary | ICD-10-CM | POA: Diagnosis not present

## 2021-06-25 DIAGNOSIS — Z86718 Personal history of other venous thrombosis and embolism: Secondary | ICD-10-CM | POA: Diagnosis not present

## 2021-06-25 DIAGNOSIS — Z87891 Personal history of nicotine dependence: Secondary | ICD-10-CM | POA: Diagnosis not present

## 2021-06-25 DIAGNOSIS — Z95828 Presence of other vascular implants and grafts: Secondary | ICD-10-CM

## 2021-06-25 DIAGNOSIS — K529 Noninfective gastroenteritis and colitis, unspecified: Secondary | ICD-10-CM

## 2021-06-25 DIAGNOSIS — C7951 Secondary malignant neoplasm of bone: Secondary | ICD-10-CM | POA: Diagnosis not present

## 2021-06-25 DIAGNOSIS — Z9221 Personal history of antineoplastic chemotherapy: Secondary | ICD-10-CM | POA: Diagnosis not present

## 2021-06-25 DIAGNOSIS — R197 Diarrhea, unspecified: Secondary | ICD-10-CM

## 2021-06-25 DIAGNOSIS — K521 Toxic gastroenteritis and colitis: Secondary | ICD-10-CM | POA: Diagnosis not present

## 2021-06-25 DIAGNOSIS — Z7901 Long term (current) use of anticoagulants: Secondary | ICD-10-CM | POA: Diagnosis not present

## 2021-06-25 DIAGNOSIS — C155 Malignant neoplasm of lower third of esophagus: Secondary | ICD-10-CM

## 2021-06-25 DIAGNOSIS — C3490 Malignant neoplasm of unspecified part of unspecified bronchus or lung: Secondary | ICD-10-CM

## 2021-06-25 DIAGNOSIS — C159 Malignant neoplasm of esophagus, unspecified: Secondary | ICD-10-CM | POA: Diagnosis not present

## 2021-06-25 DIAGNOSIS — I10 Essential (primary) hypertension: Secondary | ICD-10-CM | POA: Diagnosis not present

## 2021-06-25 DIAGNOSIS — Z86711 Personal history of pulmonary embolism: Secondary | ICD-10-CM | POA: Diagnosis not present

## 2021-06-25 DIAGNOSIS — Z923 Personal history of irradiation: Secondary | ICD-10-CM | POA: Diagnosis not present

## 2021-06-25 LAB — CBC WITH DIFFERENTIAL (CANCER CENTER ONLY)
Abs Immature Granulocytes: 0.04 10*3/uL (ref 0.00–0.07)
Basophils Absolute: 0.1 10*3/uL (ref 0.0–0.1)
Basophils Relative: 1 %
Eosinophils Absolute: 0.2 10*3/uL (ref 0.0–0.5)
Eosinophils Relative: 3 %
HCT: 35.7 % — ABNORMAL LOW (ref 36.0–46.0)
Hemoglobin: 11.6 g/dL — ABNORMAL LOW (ref 12.0–15.0)
Immature Granulocytes: 1 %
Lymphocytes Relative: 18 %
Lymphs Abs: 1.4 10*3/uL (ref 0.7–4.0)
MCH: 29.8 pg (ref 26.0–34.0)
MCHC: 32.5 g/dL (ref 30.0–36.0)
MCV: 91.8 fL (ref 80.0–100.0)
Monocytes Absolute: 0.8 10*3/uL (ref 0.1–1.0)
Monocytes Relative: 10 %
Neutro Abs: 5.5 10*3/uL (ref 1.7–7.7)
Neutrophils Relative %: 67 %
Platelet Count: 219 10*3/uL (ref 150–400)
RBC: 3.89 MIL/uL (ref 3.87–5.11)
RDW: 13.2 % (ref 11.5–15.5)
WBC Count: 8 10*3/uL (ref 4.0–10.5)
nRBC: 0 % (ref 0.0–0.2)

## 2021-06-25 LAB — CMP (CANCER CENTER ONLY)
ALT: 11 U/L (ref 0–44)
AST: 19 U/L (ref 15–41)
Albumin: 3.2 g/dL — ABNORMAL LOW (ref 3.5–5.0)
Alkaline Phosphatase: 55 U/L (ref 38–126)
Anion gap: 7 (ref 5–15)
BUN: 23 mg/dL (ref 8–23)
CO2: 26 mmol/L (ref 22–32)
Calcium: 8.9 mg/dL (ref 8.9–10.3)
Chloride: 107 mmol/L (ref 98–111)
Creatinine: 1.17 mg/dL — ABNORMAL HIGH (ref 0.44–1.00)
GFR, Estimated: 47 mL/min — ABNORMAL LOW (ref >60)
Glucose, Bld: 81 mg/dL (ref 70–99)
Potassium: 4.3 mmol/L (ref 3.5–5.1)
Sodium: 140 mmol/L (ref 135–145)
Total Bilirubin: 0.3 mg/dL (ref 0.3–1.2)
Total Protein: 7.8 g/dL (ref 6.5–8.1)

## 2021-06-25 LAB — IRON AND TIBC
Iron: 65 ug/dL (ref 41–142)
Saturation Ratios: 28 % (ref 21–57)
TIBC: 228 ug/dL — ABNORMAL LOW (ref 236–444)
UIBC: 163 ug/dL (ref 120–384)

## 2021-06-25 LAB — FERRITIN: Ferritin: 74 ng/mL (ref 11–307)

## 2021-06-25 MED ORDER — OCTREOTIDE ACETATE 30 MG IM KIT
PACK | INTRAMUSCULAR | Status: AC
  Filled 2021-06-25: qty 1

## 2021-06-25 MED ORDER — OCTREOTIDE ACETATE 30 MG IM KIT
30.0000 mg | PACK | Freq: Once | INTRAMUSCULAR | Status: AC
  Administered 2021-06-25: 30 mg via INTRAMUSCULAR

## 2021-06-25 MED ORDER — SODIUM CHLORIDE 0.9% FLUSH
10.0000 mL | Freq: Once | INTRAVENOUS | Status: AC
Start: 2021-06-25 — End: 2021-06-25
  Administered 2021-06-25: 10 mL
  Filled 2021-06-25: qty 10

## 2021-06-25 MED ORDER — HEPARIN SOD (PORK) LOCK FLUSH 100 UNIT/ML IV SOLN
500.0000 [IU] | Freq: Once | INTRAVENOUS | Status: AC
  Administered 2021-06-25: 500 [IU]
  Filled 2021-06-25: qty 5

## 2021-06-25 NOTE — Patient Instructions (Signed)
Octreotide injection solution What is this medication? OCTREOTIDE (ok TREE oh tide) is used to reduce blood levels of growth hormone in patients with a condition called acromegaly. This medicine also reduces flushing and watery diarrhea caused by certain types of cancer. This medicine may be used for other purposes; ask your health care provider or pharmacist if you have questions. COMMON BRAND NAME(S): Bynfezia, Sandostatin What should I tell my care team before I take this medication? They need to know if you have any of these conditions: diabetes gallbladder disease kidney disease liver disease thyroid disease an unusual or allergic reaction to octreotide, other medicines, foods, dyes, or preservatives pregnant or trying to get pregnant breast-feeding How should I use this medication? This medicine is for injection under the skin or into a vein (only in emergency situations). It is usually given by a health care professional in a hospital or clinic setting. If you get this medicine at home, you will be taught how to prepare and give this medicine. Allow the injection solution to come to room temperature before use. Do not warm it artificially. Use exactly as directed. Take your medicine at regular intervals. Do not take your medicine more often than directed. It is important that you put your used needles and syringes in a special sharps container. Do not put them in a trash can. If you do not have a sharps container, call your pharmacist or healthcare provider to get one. Talk to your pediatrician regarding the use of this medicine in children. Special care may be needed. Overdosage: If you think you have taken too much of this medicine contact a poison control center or emergency room at once. NOTE: This medicine is only for you. Do not share this medicine with others. What if I miss a dose? If you miss a dose, take it as soon as you can. If it is almost time for your next dose, take only  that dose. Do not take double or extra doses. What may interact with this medication? bromocriptine certain medicines for blood pressure, heart disease, irregular heartbeat cyclosporine diuretics medicines for diabetes, including insulin quinidine This list may not describe all possible interactions. Give your health care provider a list of all the medicines, herbs, non-prescription drugs, or dietary supplements you use. Also tell them if you smoke, drink alcohol, or use illegal drugs. Some items may interact with your medicine. What should I watch for while using this medication? Visit your doctor or health care professional for regular checks on your progress. To help reduce irritation at the injection site, use a different site for each injection and make sure the solution is at room temperature before use. This medicine may cause decreases in blood sugar. Signs of low blood sugar include chills, cool, pale skin or cold sweats, drowsiness, extreme hunger, fast heartbeat, headache, nausea, nervousness or anxiety, shakiness, trembling, unsteadiness, tiredness, or weakness. Contact your doctor or health care professional right away if you experience any of these symptoms. This medicine may increase blood sugar. Ask your healthcare provider if changes in diet or medicines are needed if you have diabetes. This medicine may cause a decrease in vitamin B12. You should make sure that you get enough vitamin B12 while you are taking this medicine. Discuss the foods you eat and the vitamins you take with your health care professional. What side effects may I notice from receiving this medication? Side effects that you should report to your doctor or health care professional as soon as   possible: allergic reactions like skin rash, itching or hives, swelling of the face, lips, or tongue fast, slow, or irregular heartbeat right upper belly pain severe stomach pain signs and symptoms of high blood sugar such  as being more thirsty or hungry or having to urinate more than normal. You may also feel very tired or have blurry vision. signs and symptoms of low blood sugar such as feeling anxious; confusion; dizziness; increased hunger; unusually weak or tired; increased sweating; shakiness; cold, clammy skin; irritable; headache; blurred vision; fast heartbeat; loss of consciousness unusually weak or tired Side effects that usually do not require medical attention (report to your doctor or health care professional if they continue or are bothersome): diarrhea dizziness gas headache nausea, vomiting pain, redness, or irritation at site where injected upset stomach This list may not describe all possible side effects. Call your doctor for medical advice about side effects. You may report side effects to FDA at 1-800-FDA-1088. Where should I keep my medication? Keep out of the reach of children. Store in a refrigerator between 2 and 8 degrees C (36 and 46 degrees F). Protect from light. Allow to come to room temperature naturally. Do not use artificial heat. If protected from light, the injection may be stored at room temperature between 20 and 30 degrees C (70 and 86 degrees F) for 14 days. After the initial use, throw away any unused portion of a multiple dose vial after 14 days. Throw away unused portions of the ampules after use. NOTE: This sheet is a summary. It may not cover all possible information. If you have questions about this medicine, talk to your doctor, pharmacist, or health care provider.  2022 Elsevier/Gold Standard (2019-06-20 13:33:09)  

## 2021-06-28 ENCOUNTER — Telehealth: Payer: Self-pay | Admitting: Hematology

## 2021-06-28 NOTE — Telephone Encounter (Signed)
Scheduled follow-up appointments per 7/22 los. Patient is aware. Mailed calendar.

## 2021-07-02 ENCOUNTER — Encounter: Payer: Self-pay | Admitting: Hematology

## 2021-07-15 ENCOUNTER — Other Ambulatory Visit: Payer: Self-pay | Admitting: Physician Assistant

## 2021-07-15 DIAGNOSIS — L259 Unspecified contact dermatitis, unspecified cause: Secondary | ICD-10-CM

## 2021-07-23 ENCOUNTER — Inpatient Hospital Stay: Payer: Medicare Other | Attending: Hematology

## 2021-07-23 ENCOUNTER — Other Ambulatory Visit: Payer: Self-pay

## 2021-07-23 VITALS — BP 155/75 | HR 76 | Temp 98.1°F | Resp 20

## 2021-07-23 DIAGNOSIS — C155 Malignant neoplasm of lower third of esophagus: Secondary | ICD-10-CM | POA: Insufficient documentation

## 2021-07-23 DIAGNOSIS — Z86711 Personal history of pulmonary embolism: Secondary | ICD-10-CM | POA: Diagnosis not present

## 2021-07-23 DIAGNOSIS — Z85118 Personal history of other malignant neoplasm of bronchus and lung: Secondary | ICD-10-CM | POA: Diagnosis not present

## 2021-07-23 DIAGNOSIS — C7951 Secondary malignant neoplasm of bone: Secondary | ICD-10-CM | POA: Diagnosis not present

## 2021-07-23 DIAGNOSIS — Z7901 Long term (current) use of anticoagulants: Secondary | ICD-10-CM | POA: Diagnosis not present

## 2021-07-23 DIAGNOSIS — Z86718 Personal history of other venous thrombosis and embolism: Secondary | ICD-10-CM | POA: Diagnosis not present

## 2021-07-23 DIAGNOSIS — K529 Noninfective gastroenteritis and colitis, unspecified: Secondary | ICD-10-CM

## 2021-07-23 DIAGNOSIS — Z7189 Other specified counseling: Secondary | ICD-10-CM

## 2021-07-23 DIAGNOSIS — C3490 Malignant neoplasm of unspecified part of unspecified bronchus or lung: Secondary | ICD-10-CM

## 2021-07-23 DIAGNOSIS — Z95828 Presence of other vascular implants and grafts: Secondary | ICD-10-CM

## 2021-07-23 DIAGNOSIS — K521 Toxic gastroenteritis and colitis: Secondary | ICD-10-CM | POA: Insufficient documentation

## 2021-07-23 DIAGNOSIS — Z79899 Other long term (current) drug therapy: Secondary | ICD-10-CM | POA: Insufficient documentation

## 2021-07-23 DIAGNOSIS — R197 Diarrhea, unspecified: Secondary | ICD-10-CM

## 2021-07-23 MED ORDER — OCTREOTIDE ACETATE 30 MG IM KIT
30.0000 mg | PACK | Freq: Once | INTRAMUSCULAR | Status: AC
  Administered 2021-07-23: 30 mg via INTRAMUSCULAR
  Filled 2021-07-23: qty 1

## 2021-07-31 ENCOUNTER — Other Ambulatory Visit: Payer: Self-pay | Admitting: Physician Assistant

## 2021-07-31 DIAGNOSIS — L259 Unspecified contact dermatitis, unspecified cause: Secondary | ICD-10-CM

## 2021-08-20 ENCOUNTER — Other Ambulatory Visit: Payer: Self-pay

## 2021-08-20 ENCOUNTER — Inpatient Hospital Stay: Payer: Medicare Other | Attending: Hematology

## 2021-08-20 ENCOUNTER — Inpatient Hospital Stay: Payer: Medicare Other

## 2021-08-20 VITALS — BP 137/73 | HR 68 | Temp 98.8°F | Resp 15

## 2021-08-20 DIAGNOSIS — K521 Toxic gastroenteritis and colitis: Secondary | ICD-10-CM

## 2021-08-20 DIAGNOSIS — Z7901 Long term (current) use of anticoagulants: Secondary | ICD-10-CM | POA: Diagnosis not present

## 2021-08-20 DIAGNOSIS — K529 Noninfective gastroenteritis and colitis, unspecified: Secondary | ICD-10-CM

## 2021-08-20 DIAGNOSIS — C3491 Malignant neoplasm of unspecified part of right bronchus or lung: Secondary | ICD-10-CM

## 2021-08-20 DIAGNOSIS — C155 Malignant neoplasm of lower third of esophagus: Secondary | ICD-10-CM | POA: Diagnosis not present

## 2021-08-20 DIAGNOSIS — Z79899 Other long term (current) drug therapy: Secondary | ICD-10-CM | POA: Insufficient documentation

## 2021-08-20 DIAGNOSIS — Z7189 Other specified counseling: Secondary | ICD-10-CM

## 2021-08-20 DIAGNOSIS — C3490 Malignant neoplasm of unspecified part of unspecified bronchus or lung: Secondary | ICD-10-CM

## 2021-08-20 DIAGNOSIS — C7951 Secondary malignant neoplasm of bone: Secondary | ICD-10-CM | POA: Insufficient documentation

## 2021-08-20 DIAGNOSIS — Z85118 Personal history of other malignant neoplasm of bronchus and lung: Secondary | ICD-10-CM | POA: Insufficient documentation

## 2021-08-20 DIAGNOSIS — Z86711 Personal history of pulmonary embolism: Secondary | ICD-10-CM | POA: Diagnosis not present

## 2021-08-20 DIAGNOSIS — I10 Essential (primary) hypertension: Secondary | ICD-10-CM | POA: Insufficient documentation

## 2021-08-20 DIAGNOSIS — Z87891 Personal history of nicotine dependence: Secondary | ICD-10-CM | POA: Diagnosis not present

## 2021-08-20 DIAGNOSIS — R197 Diarrhea, unspecified: Secondary | ICD-10-CM

## 2021-08-20 DIAGNOSIS — Z86718 Personal history of other venous thrombosis and embolism: Secondary | ICD-10-CM | POA: Insufficient documentation

## 2021-08-20 DIAGNOSIS — Z95828 Presence of other vascular implants and grafts: Secondary | ICD-10-CM

## 2021-08-20 LAB — CMP (CANCER CENTER ONLY)
ALT: 12 U/L (ref 0–44)
AST: 24 U/L (ref 15–41)
Albumin: 3.6 g/dL (ref 3.5–5.0)
Alkaline Phosphatase: 59 U/L (ref 38–126)
Anion gap: 9 (ref 5–15)
BUN: 31 mg/dL — ABNORMAL HIGH (ref 8–23)
CO2: 26 mmol/L (ref 22–32)
Calcium: 10.2 mg/dL (ref 8.9–10.3)
Chloride: 104 mmol/L (ref 98–111)
Creatinine: 1.38 mg/dL — ABNORMAL HIGH (ref 0.44–1.00)
GFR, Estimated: 38 mL/min — ABNORMAL LOW (ref >60)
Glucose, Bld: 75 mg/dL (ref 70–99)
Potassium: 4.6 mmol/L (ref 3.5–5.1)
Sodium: 139 mmol/L (ref 135–145)
Total Bilirubin: 0.4 mg/dL (ref 0.3–1.2)
Total Protein: 8.2 g/dL — ABNORMAL HIGH (ref 6.5–8.1)

## 2021-08-20 LAB — CBC WITH DIFFERENTIAL (CANCER CENTER ONLY)
Abs Immature Granulocytes: 0.05 10*3/uL (ref 0.00–0.07)
Basophils Absolute: 0.1 10*3/uL (ref 0.0–0.1)
Basophils Relative: 1 %
Eosinophils Absolute: 0.2 10*3/uL (ref 0.0–0.5)
Eosinophils Relative: 3 %
HCT: 38 % (ref 36.0–46.0)
Hemoglobin: 12.3 g/dL (ref 12.0–15.0)
Immature Granulocytes: 1 %
Lymphocytes Relative: 22 %
Lymphs Abs: 2 10*3/uL (ref 0.7–4.0)
MCH: 29.6 pg (ref 26.0–34.0)
MCHC: 32.4 g/dL (ref 30.0–36.0)
MCV: 91.6 fL (ref 80.0–100.0)
Monocytes Absolute: 1 10*3/uL (ref 0.1–1.0)
Monocytes Relative: 10 %
Neutro Abs: 5.9 10*3/uL (ref 1.7–7.7)
Neutrophils Relative %: 63 %
Platelet Count: 218 10*3/uL (ref 150–400)
RBC: 4.15 MIL/uL (ref 3.87–5.11)
RDW: 14.2 % (ref 11.5–15.5)
WBC Count: 9.2 10*3/uL (ref 4.0–10.5)
nRBC: 0 % (ref 0.0–0.2)

## 2021-08-20 MED ORDER — DENOSUMAB 120 MG/1.7ML ~~LOC~~ SOLN
120.0000 mg | Freq: Once | SUBCUTANEOUS | Status: AC
Start: 2021-08-20 — End: 2021-08-20
  Administered 2021-08-20: 120 mg via SUBCUTANEOUS
  Filled 2021-08-20: qty 1.7

## 2021-08-20 MED ORDER — HEPARIN SOD (PORK) LOCK FLUSH 100 UNIT/ML IV SOLN
500.0000 [IU] | Freq: Once | INTRAVENOUS | Status: AC
  Administered 2021-08-20: 500 [IU]

## 2021-08-20 MED ORDER — SODIUM CHLORIDE 0.9% FLUSH
10.0000 mL | Freq: Once | INTRAVENOUS | Status: AC
  Administered 2021-08-20: 10 mL

## 2021-08-20 MED ORDER — OCTREOTIDE ACETATE 30 MG IM KIT
30.0000 mg | PACK | Freq: Once | INTRAMUSCULAR | Status: AC
  Administered 2021-08-20: 30 mg via INTRAMUSCULAR
  Filled 2021-08-20: qty 1

## 2021-08-20 NOTE — Patient Instructions (Signed)
Octreotide injection solution What is this medication? OCTREOTIDE (ok TREE oh tide) is used to reduce blood levels of growth hormone in patients with a condition called acromegaly. This medicine also reduces flushing and watery diarrhea caused by certain types of cancer. This medicine may be used for other purposes; ask your health care provider or pharmacist if you have questions. COMMON BRAND NAME(S): Bynfezia, Sandostatin What should I tell my care team before I take this medication? They need to know if you have any of these conditions: diabetes gallbladder disease kidney disease liver disease thyroid disease an unusual or allergic reaction to octreotide, other medicines, foods, dyes, or preservatives pregnant or trying to get pregnant breast-feeding How should I use this medication? This medicine is for injection under the skin or into a vein (only in emergency situations). It is usually given by a health care professional in a hospital or clinic setting. If you get this medicine at home, you will be taught how to prepare and give this medicine. Allow the injection solution to come to room temperature before use. Do not warm it artificially. Use exactly as directed. Take your medicine at regular intervals. Do not take your medicine more often than directed. It is important that you put your used needles and syringes in a special sharps container. Do not put them in a trash can. If you do not have a sharps container, call your pharmacist or healthcare provider to get one. Talk to your pediatrician regarding the use of this medicine in children. Special care may be needed. Overdosage: If you think you have taken too much of this medicine contact a poison control center or emergency room at once. NOTE: This medicine is only for you. Do not share this medicine with others. What if I miss a dose? If you miss a dose, take it as soon as you can. If it is almost time for your next dose, take only  that dose. Do not take double or extra doses. What may interact with this medication? bromocriptine certain medicines for blood pressure, heart disease, irregular heartbeat cyclosporine diuretics medicines for diabetes, including insulin quinidine This list may not describe all possible interactions. Give your health care provider a list of all the medicines, herbs, non-prescription drugs, or dietary supplements you use. Also tell them if you smoke, drink alcohol, or use illegal drugs. Some items may interact with your medicine. What should I watch for while using this medication? Visit your doctor or health care professional for regular checks on your progress. To help reduce irritation at the injection site, use a different site for each injection and make sure the solution is at room temperature before use. This medicine may cause decreases in blood sugar. Signs of low blood sugar include chills, cool, pale skin or cold sweats, drowsiness, extreme hunger, fast heartbeat, headache, nausea, nervousness or anxiety, shakiness, trembling, unsteadiness, tiredness, or weakness. Contact your doctor or health care professional right away if you experience any of these symptoms. This medicine may increase blood sugar. Ask your healthcare provider if changes in diet or medicines are needed if you have diabetes. This medicine may cause a decrease in vitamin B12. You should make sure that you get enough vitamin B12 while you are taking this medicine. Discuss the foods you eat and the vitamins you take with your health care professional. What side effects may I notice from receiving this medication? Side effects that you should report to your doctor or health care professional as soon as   possible: allergic reactions like skin rash, itching or hives, swelling of the face, lips, or tongue fast, slow, or irregular heartbeat right upper belly pain severe stomach pain signs and symptoms of high blood sugar such  as being more thirsty or hungry or having to urinate more than normal. You may also feel very tired or have blurry vision. signs and symptoms of low blood sugar such as feeling anxious; confusion; dizziness; increased hunger; unusually weak or tired; increased sweating; shakiness; cold, clammy skin; irritable; headache; blurred vision; fast heartbeat; loss of consciousness unusually weak or tired Side effects that usually do not require medical attention (report to your doctor or health care professional if they continue or are bothersome): diarrhea dizziness gas headache nausea, vomiting pain, redness, or irritation at site where injected upset stomach This list may not describe all possible side effects. Call your doctor for medical advice about side effects. You may report side effects to FDA at 1-800-FDA-1088. Where should I keep my medication? Keep out of the reach of children. Store in a refrigerator between 2 and 8 degrees C (36 and 46 degrees F). Protect from light. Allow to come to room temperature naturally. Do not use artificial heat. If protected from light, the injection may be stored at room temperature between 20 and 30 degrees C (70 and 86 degrees F) for 14 days. After the initial use, throw away any unused portion of a multiple dose vial after 14 days. Throw away unused portions of the ampules after use. NOTE: This sheet is a summary. It may not cover all possible information. If you have questions about this medicine, talk to your doctor, pharmacist, or health care provider.  2022 Elsevier/Gold Standard (2019-06-20 13:33:09)  

## 2021-08-27 ENCOUNTER — Ambulatory Visit: Payer: Medicare Other

## 2021-09-16 ENCOUNTER — Other Ambulatory Visit: Payer: Self-pay

## 2021-09-16 DIAGNOSIS — C155 Malignant neoplasm of lower third of esophagus: Secondary | ICD-10-CM

## 2021-09-17 ENCOUNTER — Other Ambulatory Visit: Payer: Self-pay

## 2021-09-17 ENCOUNTER — Inpatient Hospital Stay: Payer: Medicare Other

## 2021-09-17 ENCOUNTER — Inpatient Hospital Stay (HOSPITAL_BASED_OUTPATIENT_CLINIC_OR_DEPARTMENT_OTHER): Payer: Medicare Other | Admitting: Hematology

## 2021-09-17 ENCOUNTER — Inpatient Hospital Stay: Payer: Medicare Other | Attending: Hematology

## 2021-09-17 VITALS — BP 156/73 | HR 89 | Temp 97.9°F | Resp 17 | Ht 68.5 in | Wt 158.0 lb

## 2021-09-17 DIAGNOSIS — K529 Noninfective gastroenteritis and colitis, unspecified: Secondary | ICD-10-CM

## 2021-09-17 DIAGNOSIS — C155 Malignant neoplasm of lower third of esophagus: Secondary | ICD-10-CM | POA: Diagnosis not present

## 2021-09-17 DIAGNOSIS — Z79899 Other long term (current) drug therapy: Secondary | ICD-10-CM | POA: Insufficient documentation

## 2021-09-17 DIAGNOSIS — R197 Diarrhea, unspecified: Secondary | ICD-10-CM

## 2021-09-17 DIAGNOSIS — Z86711 Personal history of pulmonary embolism: Secondary | ICD-10-CM | POA: Diagnosis not present

## 2021-09-17 DIAGNOSIS — Z7901 Long term (current) use of anticoagulants: Secondary | ICD-10-CM | POA: Diagnosis not present

## 2021-09-17 DIAGNOSIS — C7951 Secondary malignant neoplasm of bone: Secondary | ICD-10-CM | POA: Diagnosis not present

## 2021-09-17 DIAGNOSIS — K521 Toxic gastroenteritis and colitis: Secondary | ICD-10-CM | POA: Diagnosis not present

## 2021-09-17 DIAGNOSIS — Z7189 Other specified counseling: Secondary | ICD-10-CM

## 2021-09-17 DIAGNOSIS — Z95828 Presence of other vascular implants and grafts: Secondary | ICD-10-CM

## 2021-09-17 DIAGNOSIS — T451X5D Adverse effect of antineoplastic and immunosuppressive drugs, subsequent encounter: Secondary | ICD-10-CM | POA: Diagnosis not present

## 2021-09-17 DIAGNOSIS — Z86718 Personal history of other venous thrombosis and embolism: Secondary | ICD-10-CM | POA: Insufficient documentation

## 2021-09-17 DIAGNOSIS — C3490 Malignant neoplasm of unspecified part of unspecified bronchus or lung: Secondary | ICD-10-CM

## 2021-09-17 DIAGNOSIS — C3491 Malignant neoplasm of unspecified part of right bronchus or lung: Secondary | ICD-10-CM | POA: Diagnosis not present

## 2021-09-17 LAB — CBC WITH DIFFERENTIAL (CANCER CENTER ONLY)
Abs Immature Granulocytes: 0.03 10*3/uL (ref 0.00–0.07)
Basophils Absolute: 0.1 10*3/uL (ref 0.0–0.1)
Basophils Relative: 1 %
Eosinophils Absolute: 0.2 10*3/uL (ref 0.0–0.5)
Eosinophils Relative: 3 %
HCT: 35.5 % — ABNORMAL LOW (ref 36.0–46.0)
Hemoglobin: 11.5 g/dL — ABNORMAL LOW (ref 12.0–15.0)
Immature Granulocytes: 0 %
Lymphocytes Relative: 18 %
Lymphs Abs: 1.2 10*3/uL (ref 0.7–4.0)
MCH: 29.8 pg (ref 26.0–34.0)
MCHC: 32.4 g/dL (ref 30.0–36.0)
MCV: 92 fL (ref 80.0–100.0)
Monocytes Absolute: 0.5 10*3/uL (ref 0.1–1.0)
Monocytes Relative: 7 %
Neutro Abs: 5 10*3/uL (ref 1.7–7.7)
Neutrophils Relative %: 71 %
Platelet Count: 193 10*3/uL (ref 150–400)
RBC: 3.86 MIL/uL — ABNORMAL LOW (ref 3.87–5.11)
RDW: 14.5 % (ref 11.5–15.5)
WBC Count: 7 10*3/uL (ref 4.0–10.5)
nRBC: 0 % (ref 0.0–0.2)

## 2021-09-17 LAB — CMP (CANCER CENTER ONLY)
ALT: 13 U/L (ref 0–44)
AST: 25 U/L (ref 15–41)
Albumin: 3.4 g/dL — ABNORMAL LOW (ref 3.5–5.0)
Alkaline Phosphatase: 48 U/L (ref 38–126)
Anion gap: 9 (ref 5–15)
BUN: 17 mg/dL (ref 8–23)
CO2: 24 mmol/L (ref 22–32)
Calcium: 8.9 mg/dL (ref 8.9–10.3)
Chloride: 103 mmol/L (ref 98–111)
Creatinine: 1.18 mg/dL — ABNORMAL HIGH (ref 0.44–1.00)
GFR, Estimated: 46 mL/min — ABNORMAL LOW (ref >60)
Glucose, Bld: 195 mg/dL — ABNORMAL HIGH (ref 70–99)
Potassium: 3.6 mmol/L (ref 3.5–5.1)
Sodium: 136 mmol/L (ref 135–145)
Total Bilirubin: 0.4 mg/dL (ref 0.3–1.2)
Total Protein: 7.5 g/dL (ref 6.5–8.1)

## 2021-09-17 MED ORDER — HEPARIN SOD (PORK) LOCK FLUSH 100 UNIT/ML IV SOLN
500.0000 [IU] | Freq: Once | INTRAVENOUS | Status: AC
  Administered 2021-09-17: 500 [IU]

## 2021-09-17 MED ORDER — OCTREOTIDE ACETATE 30 MG IM KIT
30.0000 mg | PACK | Freq: Once | INTRAMUSCULAR | Status: AC
  Administered 2021-09-17: 30 mg via INTRAMUSCULAR
  Filled 2021-09-17: qty 1

## 2021-09-17 MED ORDER — SODIUM CHLORIDE 0.9% FLUSH
10.0000 mL | Freq: Once | INTRAVENOUS | Status: AC
  Administered 2021-09-17: 10 mL

## 2021-09-20 ENCOUNTER — Telehealth: Payer: Self-pay | Admitting: Hematology

## 2021-09-20 NOTE — Telephone Encounter (Signed)
Left message with follow-up appointments per 10/14 los.

## 2021-09-23 ENCOUNTER — Encounter: Payer: Self-pay | Admitting: Hematology

## 2021-09-23 NOTE — Progress Notes (Addendum)
HEMATOLOGY ONCOLOGY PROGRESS NOTE  Date of service:  .09/17/2021   Patient Care Team: [name], PA-C as PCP - General (Physician Assistant) [name], MD as Consulting Physician (Hematology and Oncology)  CHIEF COMPLAINTS/PURPOSE OF CONSULTATION:  F/u for metastatic lung cancer and esophageal cancer  DIAGNOSIS:   #1 Metastatic non-small cell lung cancer with bilateral lung nodules and large metastatic lesion in the left Ilium. 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 Also had c diff colitis - treated   Current Treatment  1) Active surveillance 2) Xgeva 112m Disautel q12weeks for bone metastases. 3) Sandostatin q4weeks for diarrhea   Previous Treatment  For metastatic lung cancer 1 Palliative radiation therapy to the large left ilium metastases 2. IV Nivolumab x 20 cycles (discontinued due to likely immune colitis) 3. Xgeva 1227mSC q4weeks for bone metastases.  For Esophageal adenocarcinoma S/p concurrent carbo/taxol + RT  HISTORY OF PRESENTING ILLNESS: (plz see my previous consultation for details of initial presentation)  INTERVAL HISTORY:   Ms. ToDigmans presenting today for her scheduled follow-up for metastatic lung cancer, and adenocarcinoma of the esophagus. The patient's last visit with [place] on 06/25/2021. The pt reports that she is doing well overall.  Patient notes no acute new symptoms.  Has been eating well and has felt okay overall. No new shortness of breath or chest pain.  No new dysphagia.  Lab results today 09/17/2021 of CBC w/diff and CMP stable.   MEDICAL HISTORY:  Past Medical History:  Diagnosis Date   Barrett's esophagus    Bone neoplasm 06/24/2015   [facility]   metastatic poorly differentiated carcinoma. tumor left groin surgical removal with radiation tx.   Cataract    BILATERAL   Cigarette smoker two packs a day or less    Currently still smoking 2 PPD  - Not interested in quitting at this time.   Colitis 2017   Colon polyps    hyperplastic, tubular adenomas, tubulovillous adenoma   Cough, persistent    hx. lung cancer ? primary-being evaluated, unsure of primary site.   Depression 06/24/2015   Diverticulosis    [place] lung [place]   Endometriosis    Hysterectomy with BSO at age [age]rs   Esophageal adenocarcinoma ([facility]   intramucosal   Gastritis    GERD (gastroesophageal reflux disease)    H/O: pneumonia    Hiatal hernia    Hyperlipidemia    Hypertension 06/24/2015   likely improved incidental to 40 lbs weight loss from her neoplasm. No Longer taking med for this as of 08-06-15   IBS (irritable bowel syndrome)    Pain    left hip-persistent"tumor of bone"-radiation tx. 10.   Vitamin D deficiency disease    SURGICAL HISTORY: Past Surgical History:  Procedure Laterality Date   ABDOMINAL HYSTERECTOMY     BALLOON DILATION N/A 10/08/2019   Procedure: BALLOON DILATION;  Surgeon: PyJerene BearsMD;  Location: [facility];  Service: Gastroenterology;  Laterality: N/A;   BARTHOLIN GLAND CYST EXCISION  5267o ago   Does not want if it was an infected cyst or tumor. Was soon as delivery   BIOPSY  01/02/2019   Procedure: BIOPSY;  Surgeon: PyJerene BearsMD;  Location: [facility];  Service: Gastroenterology;;   CATARACT EXTRACTION     COLONOSCOPY W/ POLYPECTOMY     multiple times - last done 09/2014 per patient.   ESOPHAGOGASTRODUODENOSCOPY (  EGD) WITH PROPOFOL N/A 08/11/2015   Procedure: ESOPHAGOGASTRODUODENOSCOPY (EGD) WITH PROPOFOL;  Surgeon: [name], MD;  Location: [facility];  Service: Gastroenterology;  Laterality: N/A;   ESOPHAGOGASTRODUODENOSCOPY (EGD) WITH PROPOFOL N/A 01/02/2019   Procedure: ESOPHAGOGASTRODUODENOSCOPY (EGD) WITH PROPOFOL;  Surgeon: [name], MD;  Location: [facility];  Service: Gastroenterology;  Laterality: N/A;   ESOPHAGOGASTRODUODENOSCOPY (EGD) WITH PROPOFOL N/A 10/08/2019   Procedure:  ESOPHAGOGASTRODUODENOSCOPY (EGD) WITH PROPOFOL;  Surgeon: [name], MD;  Location: [facility];  Service: Gastroenterology;  Laterality: N/A;   FLEXIBLE SIGMOIDOSCOPY N/A 06/24/2017   Procedure: FLEXIBLE SIGMOIDOSCOPY;  Surgeon: Manus Gunning, MD;  Location: [facility];  Service: Gastroenterology;  Laterality: N/A;   GANGLION CYST EXCISION     KNEE ARTHROSCOPY  age about 1 yrs   TONSILLECTOMY     TOTAL ABDOMINAL HYSTERECTOMY W/ BILATERAL SALPINGOOPHORECTOMY  at age 26 yrs   For endometriosis    SOCIAL HISTORY: Social History   Socioeconomic History   Marital status: Widowed    Spouse name: Not on file   Number of children: 2   Years of education: Not on file   Highest education level: Not on file  Occupational History   Not on file  Tobacco Use   Smoking status: Former    Packs/day: 1.00    Years: 60.00    Pack years: 60.00    Types: Cigarettes    Quit date: 12/05/2014    Years since quitting: 6.8   Smokeless tobacco: Never  Vaping Use   Vaping Use: Never used  Substance and Sexual Activity   Alcohol use: No    Alcohol/week: 0.0 standard drinks   Drug use: No   Sexual activity: Not Currently  Other Topics Concern   Not on file  Social History Narrative   Not on file   Social Determinants of Health   Financial Resource Strain: Not on file  Food Insecurity: Not on file  Transportation Needs: Not on file  Physical Activity: Not on file  Stress: Not on file  Social Connections: Not on file  Intimate Partner Violence: Not on file    FAMILY HISTORY: Family History  Problem Relation Age of Onset   Colon cancer Brother    Colon cancer Brother    Stroke Mother    Colon cancer Father    Emphysema Father        smoked   Breast cancer Daughter 79       ER/PR+ stage II    ALLERGIES:  is allergic to penicillins, remeron [mirtazapine], and latex. patient wonders if she has a penicillin allergy but notes that she is uncertain about  this.  MEDICATIONS:  Current Outpatient Medications  Medication Sig Dispense Refill   albuterol (VENTOLIN HFA) 108 (90 Base) MCG/ACT inhaler Inhale 2 puffs into the lungs every 6 (six) hours as needed for wheezing or shortness of breath. 8 g 2   amLODipine (NORVASC) 5 MG tablet Take 1 tablet (5 mg total) by mouth daily. 90 tablet 1   benzonatate (TESSALON PERLES) 100 MG capsule Take 2 capsules (200 mg total) by mouth 3 (three) times daily as needed for cough. 20 capsule 0   Biotin 5 MG CAPS Take 5 mg by mouth daily.     busPIRone (BUSPAR) 10 MG tablet TAKE 1 TABLET BY MOUTH THREE TIMES A DAY 270 tablet 0   Calcium Citrate-Vitamin D (CALCIUM + D PO) Take 1 tablet by mouth daily.     Cyanocobalamin (B-12) 2500 MCG TABS  Take 2,500 mcg by mouth daily.     FLUoxetine (PROZAC) 40 MG capsule TAKE 1 CAPSULE BY MOUTH EVERY DAY 90 capsule 1   nystatin (MYCOSTATIN) 100000 UNIT/ML suspension Take 5 mLs (500,000 Units total) by mouth 4 (four) times daily. 200 mL 0   omeprazole (PRILOSEC) 40 MG capsule TAKE 1 CAPSULE BY MOUTH EVERY DAY BEFORE BREAKFAST 90 capsule 1   traZODone (DESYREL) 50 MG tablet TAKE 1 TABLET BY MOUTH EVERYDAY AT BEDTIME 90 tablet 2   triamcinolone cream (KENALOG) 0.1 % APPLY TO AFFECTED AREA TWICE A DAY 30 g 0   XARELTO 20 MG TABS tablet TAKE 1 TABLET BY MOUTH DAILY WITH SUPPER 30 tablet 2   No current facility-administered medications for this visit.    REVIEW OF SYSTEMS:   .10 Point review of Systems was done is negative except as noted above.  PHYSICAL EXAMINATION: ECOG PERFORMANCE STATUS: 2 - Symptomatic, <50% confined to bed  Vitals:   09/17/21 1057  BP: (!) 156/73  Pulse: 89  Resp: 17  Temp: 97.9 F (36.6 C)  SpO2: 100%    Filed Weights   09/17/21 1057  Weight: 158 lb (71.7 kg)   .  Wt Readings from Last 3 Encounters:  09/17/21 158 lb (71.7 kg)  06/25/21 149 lb 9 oz (67.8 kg)  06/22/21 155 lb (70.3 kg)   NAD . GENERAL:alert, in no acute distress and  comfortable SKIN: no acute rashes, no significant lesions EYES: conjunctiva are pink and non-injected, sclera anicteric OROPHARYNX: MMM, no exudates, no oropharyngeal erythema or ulceration NECK: supple, no JVD LYMPH:  no palpable lymphadenopathy in the cervical, axillary or inguinal regions LUNGS: clear to auscultation b/l with normal respiratory effort HEART: regular rate & rhythm ABDOMEN:  normoactive bowel sounds , non tender, not distended. Extremity: no pedal edema PSYCH: alert & oriented x 3 with fluent speech NEURO: no focal motor/sensory deficits   LABORATORY DATA:  I have reviewed the data as listed  . CBC Latest Ref Rng & Units 09/17/2021 08/20/2021 06/25/2021  WBC 4.0 - 10.5 K/uL 7.0 9.2 8.0  Hemoglobin 12.0 - 15.0 g/dL 11.5(L) 12.3 11.6(L)  Hematocrit 36.0 - 46.0 % 35.5(L) 38.0 35.7(L)  Platelets 150 - 400 K/uL 193 218 219   ANC 1.8k . CMP Latest Ref Rng & Units 09/17/2021 08/20/2021 06/25/2021  Glucose 70 - 99 mg/dL 195(H) 75 81  BUN 8 - 23 mg/dL 17 31(H) 23  Creatinine 0.44 - 1.00 mg/dL 1.18(H) 1.38(H) 1.17(H)  Sodium 135 - 145 mmol/L 136 139 140  Potassium 3.5 - 5.1 mmol/L 3.6 4.6 4.3  Chloride 98 - 111 mmol/L 103 104 107  CO2 22 - 32 mmol/L 24 26 26   Calcium 8.9 - 10.3 mg/dL 8.9 10.2 8.9  Total Protein 6.5 - 8.1 g/dL 7.5 8.2(H) 7.8  Total Bilirubin 0.3 - 1.2 mg/dL 0.4 0.4 0.3  Alkaline Phos 38 - 126 U/L 48 59 55  AST 15 - 41 U/L 25 24 19   ALT 0 - 44 U/L 13 12 11    Component     Latest Ref Rng & Units 01/08/2021  Iron     41 - 142 ug/dL 85  TIBC     236 - 444 ug/dL 233 (L)  Saturation Ratios     21 - 57 % 37  UIBC     120 - 384 ug/dL 148  Folate, Hemolysate     Not Estab. ng/mL 467.0  HCT     34.0 - 46.6 % 35.5  Folate, RBC     >498 ng/mL 1,315  Vitamin B12     180 - 914 pg/mL 566  Ferritin     11 - 307 ng/mL 58       01/02/19 Esophagus Biopsy:    RADIOGRAPHIC STUDIES:  .No results found.   ASSESSMENT & PLAN:   80 y.o. female  with  #1 Metastatic poorly differentiated carcinoma with likely lung primary non-small cell lung cancer.    CT of the head with and without contrast showed no evidence of metastatic disease. EGFR blood test mutation analysis negative. CT chest abdomen pelvis 04/19/2016 shows no evidence of disease progression. Patient tolerated Nivolumab very well but was discontinued when she developed grade 2 Immune colitis. Has been off Nivolumab for >6 months  CT chest abdomen pelvis on 06/24/2016 shows no evidence of new disease or progression of metastatic disease. CT chest abdomen pelvis 09/06/2016 shows 1. Mixed interval response to therapy. 2. There is a new left ventral chest wall lesion deep to the pectoralis musculature worrisome for metastatic disease. 3. Posterior lower lobe nodular densities are identified which may reflect areas of pulmonary metastasis. 4. Interval decrease in size of destructive lesion involving the left iliac bone.  CT chest abd pelvis 12/08/2016: Cystic mass involving the left ventral chest wall has resolved in the interval. Likely was a hematoma due to trauma. Interval increase in size of pleural base mass overlying the posterior and inferior left lower lobe. There is also a new left pleural effusion identified.  CT chest 02/01/2017: Residual irregular soft tissue thickening/volume loss and trace left pleural fluid at the base of the left hemithorax, overall improved in appearance from 12/08/2016. No measurable lesion.  CT chest 05/29/2017 shows no residual pleural based mass or significant pleural effusion in the left hemithorax. No evidence of thoracic metastatic disease. No evidence of progressive metastatic disease within the abdomen or pelvis. Mixed lytic and blastic lesion involving the left iliac bone and associated pathologic fracture are unchanged.   CT CAP 09/14/17 shows no new changes. She does have slight displacement of her fractured left iliac bone. Evidence of stable  disease.   CT CAP 01/04/2018- No new or progressive metastatic disease. Stable large left iliac bone metastasis with associated chronic pathologic fracture.   CT chest/abd/pelvis done on 04/26/18 revealed Stable exam.  No new or progressive interval findings.  07/19/18 CT C/A/P revealed Stable exam.  No new or progressive interval findings. Large destructive left iliac lesion is similar to prior. Aortic Atherosclerosis and Emphysema.    11/06/18 CT C/A/P revealed Similar appearance of large mixed lytic and sclerotic lesion in the left ilium. No new metastatic lesions are otherwise noted elsewhere in the chest, abdomen or pelvis. 2. Interval development of thickening of the distal third of the esophagus. This is nonspecific, and could be related to underlying reflux esophagitis. However, if there is any clinical concern for Barrett's metaplasia or esophageal neoplasia, further evaluation with nonemergent endoscopy could be considered. 3. Aortic atherosclerosis, in addition to left main coronary artery disease. Assessment for potential risk factor modification, dietary therapy or pharmacologic therapy may be warranted, if clinically Indicated. 4. Diffuse bronchial wall thickening with mild to moderate centrilobular and paraseptal emphysema; imaging findings suggestive of underlying COPD. 5. Additional incidental findings, as above.  #2  Adenocarcinoma of the Esophagus  Barrett's esophagus 4cms in the distal esophagus with low and high-grade dysplasia  01/02/19 Surgical pathology revealed adenocarcinoma of the esophagus   01/25/19 PET/CT revealed Distal esophageal  primary, without hypermetabolic metastatic disease. 2. Chronic left iliac metastasis, as before. 3. Hypermetabolism within and superficial to the right gluteal musculature is most likely related to trauma and/or injection sites. 4. Aortic atherosclerosis, coronary artery atherosclerosis and emphysema.  S/p concurrent Carboplatin and Taxol weekly  with RT of 45 Gy in 25 fractions and 5.4 Gy boost, completed between 02/04/19 and 03/27/19  07/03/2019 PET skull base to thigh revealed "1. Interval response to therapy. Significant reduction in FDG uptake associated with distal esophageal mass. SUV max currently 2.61 versus 16.97 previously. 2. 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 Unchanged 3. Aortic Atherosclerosis (ICD10-I70.0) and Emphysema (ICD10-J43.9). Coronary artery calcifications."  07/15/2020 CT C/A/P ([id number]) ([id number]) revealed "1. No evidence of new or progressive metastatic disease in the chest, abdomen or pelvis."  #3 diarrhea-  now resolved was previously. 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 She was previously on on Lialda, budesonide,probiotics and lomotil but not currently taking any of these. Plan -Continue Sandostatin every 4 weeks   #4 h/o DVT and PE  -continue on Xarelto - no issues with bleeding   #6 Dyspnea - resolved 03/14/19 ECHO revealed LV EF of 60-65% 03/06/19 CXR revealed clear lung fields, normal heart size 03/07/19 and 02/25/19 EKGs, no overt concern but some decreased QRS amplitude Did refer to pulmonology for further evaluation and lung function testing  Began steroid inhaler to mitigate possible inflammation in airway, could be some radiation related scarring and emphysema   PLAN: -Discussed pt labwork today, 09/17/2021; reviewed with patient -The pt shows no lab or clinical evidence of progression of Metastatic Lung Cancer or Esophageal Adenocarcinoma at this time. -Will continue Sandostatin q4weeks and Xgeva q12weeks. The pt has no prohibitive toxicities. -Continue daily multivitamin.  -Continue 50K IU Vitamin D weekly  FOLLOW UP:  -continue Sandostatin q4weeks plz schedule next 4 doses -Xgeva every 12 weeks with labs - plz schedule next 3 doses. -MD  visit in 12weeks with portflush and labs -CT chest abdomen pelvis in 11 weeks    . The total time spent in the appointment was 20 minutes and more than 50% was on counseling and direct patient cares.  All of the patient's questions were answered with apparent satisfaction. The patient knows to call the clinic with any problems, questions or concerns.   Sullivan Lone MD MS Hematology/Oncology Physician [facility]      .

## 2021-09-26 NOTE — Addendum Note (Signed)
Addended by: Sullivan Lone on: 09/26/2021 06:57 AM   Modules accepted: Orders

## 2021-10-14 ENCOUNTER — Other Ambulatory Visit: Payer: Self-pay

## 2021-10-14 DIAGNOSIS — C155 Malignant neoplasm of lower third of esophagus: Secondary | ICD-10-CM

## 2021-10-15 ENCOUNTER — Other Ambulatory Visit: Payer: Self-pay

## 2021-10-15 ENCOUNTER — Inpatient Hospital Stay: Payer: Medicare Other

## 2021-10-15 ENCOUNTER — Inpatient Hospital Stay: Payer: Medicare Other | Attending: Hematology

## 2021-10-15 VITALS — BP 139/70 | HR 68 | Temp 98.0°F | Resp 16

## 2021-10-15 DIAGNOSIS — K529 Noninfective gastroenteritis and colitis, unspecified: Secondary | ICD-10-CM

## 2021-10-15 DIAGNOSIS — Z923 Personal history of irradiation: Secondary | ICD-10-CM | POA: Diagnosis not present

## 2021-10-15 DIAGNOSIS — F17210 Nicotine dependence, cigarettes, uncomplicated: Secondary | ICD-10-CM | POA: Diagnosis not present

## 2021-10-15 DIAGNOSIS — Z9221 Personal history of antineoplastic chemotherapy: Secondary | ICD-10-CM | POA: Insufficient documentation

## 2021-10-15 DIAGNOSIS — Z86718 Personal history of other venous thrombosis and embolism: Secondary | ICD-10-CM | POA: Insufficient documentation

## 2021-10-15 DIAGNOSIS — Z85118 Personal history of other malignant neoplasm of bronchus and lung: Secondary | ICD-10-CM | POA: Insufficient documentation

## 2021-10-15 DIAGNOSIS — K521 Toxic gastroenteritis and colitis: Secondary | ICD-10-CM | POA: Insufficient documentation

## 2021-10-15 DIAGNOSIS — Z7189 Other specified counseling: Secondary | ICD-10-CM

## 2021-10-15 DIAGNOSIS — Z86711 Personal history of pulmonary embolism: Secondary | ICD-10-CM | POA: Insufficient documentation

## 2021-10-15 DIAGNOSIS — R197 Diarrhea, unspecified: Secondary | ICD-10-CM

## 2021-10-15 DIAGNOSIS — C155 Malignant neoplasm of lower third of esophagus: Secondary | ICD-10-CM | POA: Insufficient documentation

## 2021-10-15 DIAGNOSIS — C3490 Malignant neoplasm of unspecified part of unspecified bronchus or lung: Secondary | ICD-10-CM

## 2021-10-15 DIAGNOSIS — C7951 Secondary malignant neoplasm of bone: Secondary | ICD-10-CM | POA: Insufficient documentation

## 2021-10-15 DIAGNOSIS — Z95828 Presence of other vascular implants and grafts: Secondary | ICD-10-CM

## 2021-10-15 LAB — CMP (CANCER CENTER ONLY)
ALT: 11 U/L (ref 0–44)
AST: 18 U/L (ref 15–41)
Albumin: 3.3 g/dL — ABNORMAL LOW (ref 3.5–5.0)
Alkaline Phosphatase: 63 U/L (ref 38–126)
Anion gap: 9 (ref 5–15)
BUN: 14 mg/dL (ref 8–23)
CO2: 25 mmol/L (ref 22–32)
Calcium: 9.1 mg/dL (ref 8.9–10.3)
Chloride: 106 mmol/L (ref 98–111)
Creatinine: 1.05 mg/dL — ABNORMAL HIGH (ref 0.44–1.00)
GFR, Estimated: 53 mL/min — ABNORMAL LOW (ref >60)
Glucose, Bld: 151 mg/dL — ABNORMAL HIGH (ref 70–99)
Potassium: 3.6 mmol/L (ref 3.5–5.1)
Sodium: 140 mmol/L (ref 135–145)
Total Bilirubin: 0.3 mg/dL (ref 0.3–1.2)
Total Protein: 7.5 g/dL (ref 6.5–8.1)

## 2021-10-15 LAB — CBC WITH DIFFERENTIAL (CANCER CENTER ONLY)
Abs Immature Granulocytes: 0.03 10*3/uL (ref 0.00–0.07)
Basophils Absolute: 0.1 10*3/uL (ref 0.0–0.1)
Basophils Relative: 1 %
Eosinophils Absolute: 0.2 10*3/uL (ref 0.0–0.5)
Eosinophils Relative: 2 %
HCT: 36.8 % (ref 36.0–46.0)
Hemoglobin: 11.8 g/dL — ABNORMAL LOW (ref 12.0–15.0)
Immature Granulocytes: 0 %
Lymphocytes Relative: 20 %
Lymphs Abs: 1.6 10*3/uL (ref 0.7–4.0)
MCH: 29.8 pg (ref 26.0–34.0)
MCHC: 32.1 g/dL (ref 30.0–36.0)
MCV: 92.9 fL (ref 80.0–100.0)
Monocytes Absolute: 0.8 10*3/uL (ref 0.1–1.0)
Monocytes Relative: 9 %
Neutro Abs: 5.6 10*3/uL (ref 1.7–7.7)
Neutrophils Relative %: 68 %
Platelet Count: 204 10*3/uL (ref 150–400)
RBC: 3.96 MIL/uL (ref 3.87–5.11)
RDW: 14.2 % (ref 11.5–15.5)
WBC Count: 8.3 10*3/uL (ref 4.0–10.5)
nRBC: 0 % (ref 0.0–0.2)

## 2021-10-15 MED ORDER — HEPARIN SOD (PORK) LOCK FLUSH 100 UNIT/ML IV SOLN
500.0000 [IU] | Freq: Once | INTRAVENOUS | Status: AC
  Administered 2021-10-15: 500 [IU]

## 2021-10-15 MED ORDER — SODIUM CHLORIDE 0.9% FLUSH
10.0000 mL | Freq: Once | INTRAVENOUS | Status: AC
  Administered 2021-10-15: 10 mL

## 2021-10-15 MED ORDER — OCTREOTIDE ACETATE 30 MG IM KIT
30.0000 mg | PACK | Freq: Once | INTRAMUSCULAR | Status: AC
  Administered 2021-10-15: 30 mg via INTRAMUSCULAR
  Filled 2021-10-15: qty 1

## 2021-10-15 NOTE — Patient Instructions (Signed)
Octreotide injection solution What is this medication? OCTREOTIDE (ok TREE oh tide) is used to reduce blood levels of growth hormone in patients with a condition called acromegaly. This medicine also reduces flushing and watery diarrhea caused by certain types of cancer. This medicine may be used for other purposes; ask your health care provider or pharmacist if you have questions. COMMON BRAND NAME(S): Leatha Gilding, Sandostatin What should I tell my care team before I take this medication? They need to know if you have any of these conditions: diabetes gallbladder disease kidney disease liver disease thyroid disease an unusual or allergic reaction to octreotide, other medicines, foods, dyes, or preservatives pregnant or trying to get pregnant breast-feeding How should I use this medication? This medicine is for injection under the skin or into a vein (only in emergency situations). It is usually given by a health care professional in a hospital or clinic setting. If you get this medicine at home, you will be taught how to prepare and give this medicine. Allow the injection solution to come to room temperature before use. Do not warm it artificially. Use exactly as directed. Take your medicine at regular intervals. Do not take your medicine more often than directed. It is important that you put your used needles and syringes in a special sharps container. Do not put them in a trash can. If you do not have a sharps container, call your pharmacist or healthcare provider to get one. Talk to your pediatrician regarding the use of this medicine in children. Special care may be needed. Overdosage: If you think you have taken too much of this medicine contact a poison control center or emergency room at once. NOTE: This medicine is only for you. Do not share this medicine with others. What if I miss a dose? If you miss a dose, take it as soon as you can. If it is almost time for your next dose, take only  that dose. Do not take double or extra doses. What may interact with this medication? bromocriptine certain medicines for blood pressure, heart disease, irregular heartbeat cyclosporine diuretics medicines for diabetes, including insulin quinidine This list may not describe all possible interactions. Give your health care provider a list of all the medicines, herbs, non-prescription drugs, or dietary supplements you use. Also tell them if you smoke, drink alcohol, or use illegal drugs. Some items may interact with your medicine. What should I watch for while using this medication? Visit your doctor or health care professional for regular checks on your progress. To help reduce irritation at the injection site, use a different site for each injection and make sure the solution is at room temperature before use. This medicine may cause decreases in blood sugar. Signs of low blood sugar include chills, cool, pale skin or cold sweats, drowsiness, extreme hunger, fast heartbeat, headache, nausea, nervousness or anxiety, shakiness, trembling, unsteadiness, tiredness, or weakness. Contact your doctor or health care professional right away if you experience any of these symptoms. This medicine may increase blood sugar. Ask your healthcare provider if changes in diet or medicines are needed if you have diabetes. This medicine may cause a decrease in vitamin B12. You should make sure that you get enough vitamin B12 while you are taking this medicine. Discuss the foods you eat and the vitamins you take with your health care professional. What side effects may I notice from receiving this medication? Side effects that you should report to your doctor or health care professional as soon as  possible: allergic reactions like skin rash, itching or hives, swelling of the face, lips, or tongue fast, slow, or irregular heartbeat right upper belly pain severe stomach pain signs and symptoms of high blood sugar such  as being more thirsty or hungry or having to urinate more than normal. You may also feel very tired or have blurry vision. signs and symptoms of low blood sugar such as feeling anxious; confusion; dizziness; increased hunger; unusually weak or tired; increased sweating; shakiness; cold, clammy skin; irritable; headache; blurred vision; fast heartbeat; loss of consciousness unusually weak or tired Side effects that usually do not require medical attention (report to your doctor or health care professional if they continue or are bothersome): diarrhea dizziness gas headache nausea, vomiting pain, redness, or irritation at site where injected upset stomach This list may not describe all possible side effects. Call your doctor for medical advice about side effects. You may report side effects to FDA at 1-800-FDA-1088. Where should I keep my medication? Keep out of the reach of children. Store in a refrigerator between 2 and 8 degrees C (36 and 46 degrees F). Protect from light. Allow to come to room temperature naturally. Do not use artificial heat. If protected from light, the injection may be stored at room temperature between 20 and 30 degrees C (70 and 86 degrees F) for 14 days. After the initial use, throw away any unused portion of a multiple dose vial after 14 days. Throw away unused portions of the ampules after use. NOTE: This sheet is a summary. It may not cover all possible information. If you have questions about this medicine, talk to your doctor, pharmacist, or health care provider.  2022 Elsevier/Gold Standard (2019-06-20 00:00:00)

## 2021-11-02 DIAGNOSIS — H10503 Unspecified blepharoconjunctivitis, bilateral: Secondary | ICD-10-CM | POA: Diagnosis not present

## 2021-11-12 ENCOUNTER — Inpatient Hospital Stay: Payer: Medicare Other

## 2021-11-12 ENCOUNTER — Inpatient Hospital Stay: Payer: Medicare Other | Attending: Hematology

## 2021-11-12 ENCOUNTER — Other Ambulatory Visit: Payer: Self-pay | Admitting: Hematology

## 2021-11-12 ENCOUNTER — Other Ambulatory Visit: Payer: Self-pay

## 2021-11-12 VITALS — BP 130/72 | HR 85 | Temp 97.8°F | Resp 17

## 2021-11-12 DIAGNOSIS — C7951 Secondary malignant neoplasm of bone: Secondary | ICD-10-CM | POA: Diagnosis not present

## 2021-11-12 DIAGNOSIS — Z95828 Presence of other vascular implants and grafts: Secondary | ICD-10-CM

## 2021-11-12 DIAGNOSIS — C3491 Malignant neoplasm of unspecified part of right bronchus or lung: Secondary | ICD-10-CM

## 2021-11-12 DIAGNOSIS — C155 Malignant neoplasm of lower third of esophagus: Secondary | ICD-10-CM | POA: Diagnosis not present

## 2021-11-12 DIAGNOSIS — Z7189 Other specified counseling: Secondary | ICD-10-CM

## 2021-11-12 DIAGNOSIS — K529 Noninfective gastroenteritis and colitis, unspecified: Secondary | ICD-10-CM

## 2021-11-12 DIAGNOSIS — R197 Diarrhea, unspecified: Secondary | ICD-10-CM

## 2021-11-12 DIAGNOSIS — K521 Toxic gastroenteritis and colitis: Secondary | ICD-10-CM | POA: Diagnosis not present

## 2021-11-12 DIAGNOSIS — C3490 Malignant neoplasm of unspecified part of unspecified bronchus or lung: Secondary | ICD-10-CM

## 2021-11-12 LAB — CMP (CANCER CENTER ONLY)
ALT: 10 U/L (ref 0–44)
AST: 21 U/L (ref 15–41)
Albumin: 3.3 g/dL — ABNORMAL LOW (ref 3.5–5.0)
Alkaline Phosphatase: 66 U/L (ref 38–126)
Anion gap: 11 (ref 5–15)
BUN: 17 mg/dL (ref 8–23)
CO2: 25 mmol/L (ref 22–32)
Calcium: 9 mg/dL (ref 8.9–10.3)
Chloride: 103 mmol/L (ref 98–111)
Creatinine: 1.57 mg/dL — ABNORMAL HIGH (ref 0.44–1.00)
GFR, Estimated: 33 mL/min — ABNORMAL LOW (ref >60)
Glucose, Bld: 110 mg/dL — ABNORMAL HIGH (ref 70–99)
Potassium: 4.3 mmol/L (ref 3.5–5.1)
Sodium: 139 mmol/L (ref 135–145)
Total Bilirubin: 0.4 mg/dL (ref 0.3–1.2)
Total Protein: 8.1 g/dL (ref 6.5–8.1)

## 2021-11-12 LAB — CBC WITH DIFFERENTIAL (CANCER CENTER ONLY)
Abs Immature Granulocytes: 0.03 10*3/uL (ref 0.00–0.07)
Basophils Absolute: 0.1 10*3/uL (ref 0.0–0.1)
Basophils Relative: 1 %
Eosinophils Absolute: 0.2 10*3/uL (ref 0.0–0.5)
Eosinophils Relative: 3 %
HCT: 40.7 % (ref 36.0–46.0)
Hemoglobin: 13 g/dL (ref 12.0–15.0)
Immature Granulocytes: 0 %
Lymphocytes Relative: 16 %
Lymphs Abs: 1.3 10*3/uL (ref 0.7–4.0)
MCH: 29.6 pg (ref 26.0–34.0)
MCHC: 31.9 g/dL (ref 30.0–36.0)
MCV: 92.7 fL (ref 80.0–100.0)
Monocytes Absolute: 0.8 10*3/uL (ref 0.1–1.0)
Monocytes Relative: 11 %
Neutro Abs: 5.4 10*3/uL (ref 1.7–7.7)
Neutrophils Relative %: 69 %
Platelet Count: 219 10*3/uL (ref 150–400)
RBC: 4.39 MIL/uL (ref 3.87–5.11)
RDW: 14.2 % (ref 11.5–15.5)
WBC Count: 7.8 10*3/uL (ref 4.0–10.5)
nRBC: 0 % (ref 0.0–0.2)

## 2021-11-12 MED ORDER — OCTREOTIDE ACETATE 30 MG IM KIT
30.0000 mg | PACK | Freq: Once | INTRAMUSCULAR | Status: AC
  Administered 2021-11-12: 30 mg via INTRAMUSCULAR
  Filled 2021-11-12: qty 1

## 2021-11-12 MED ORDER — HEPARIN SOD (PORK) LOCK FLUSH 100 UNIT/ML IV SOLN
500.0000 [IU] | Freq: Once | INTRAVENOUS | Status: AC
  Administered 2021-11-12: 500 [IU] via INTRAVENOUS

## 2021-11-12 MED ORDER — SODIUM CHLORIDE 0.9% FLUSH
10.0000 mL | INTRAVENOUS | Status: DC | PRN
  Administered 2021-11-12: 10 mL via INTRAVENOUS

## 2021-11-12 MED ORDER — DENOSUMAB 120 MG/1.7ML ~~LOC~~ SOLN
120.0000 mg | Freq: Once | SUBCUTANEOUS | Status: AC
  Administered 2021-11-12: 120 mg via SUBCUTANEOUS
  Filled 2021-11-12: qty 1.7

## 2021-11-15 ENCOUNTER — Other Ambulatory Visit: Payer: Self-pay | Admitting: Hematology

## 2021-11-15 ENCOUNTER — Other Ambulatory Visit: Payer: Self-pay | Admitting: Physician Assistant

## 2021-11-15 DIAGNOSIS — F339 Major depressive disorder, recurrent, unspecified: Secondary | ICD-10-CM

## 2021-11-26 ENCOUNTER — Ambulatory Visit (INDEPENDENT_AMBULATORY_CARE_PROVIDER_SITE_OTHER): Payer: Medicare Other | Admitting: Physician Assistant

## 2021-11-26 ENCOUNTER — Encounter: Payer: Self-pay | Admitting: Physician Assistant

## 2021-11-26 ENCOUNTER — Other Ambulatory Visit: Payer: Self-pay

## 2021-11-26 VITALS — BP 119/79 | HR 74 | Temp 97.5°F | Ht 69.0 in | Wt 155.0 lb

## 2021-11-26 DIAGNOSIS — I10 Essential (primary) hypertension: Secondary | ICD-10-CM | POA: Diagnosis not present

## 2021-11-26 DIAGNOSIS — Z87898 Personal history of other specified conditions: Secondary | ICD-10-CM | POA: Diagnosis not present

## 2021-11-26 DIAGNOSIS — E559 Vitamin D deficiency, unspecified: Secondary | ICD-10-CM

## 2021-11-26 DIAGNOSIS — F330 Major depressive disorder, recurrent, mild: Secondary | ICD-10-CM

## 2021-11-26 DIAGNOSIS — Z0000 Encounter for general adult medical examination without abnormal findings: Secondary | ICD-10-CM

## 2021-11-26 DIAGNOSIS — E785 Hyperlipidemia, unspecified: Secondary | ICD-10-CM | POA: Diagnosis not present

## 2021-11-26 MED ORDER — ESCITALOPRAM OXALATE 10 MG PO TABS: ORAL_TABLET | ORAL | 0 refills | Status: DC

## 2021-11-26 MED ORDER — FLUOXETINE HCL 10 MG PO CAPS: ORAL_CAPSULE | ORAL | 0 refills | Status: DC

## 2021-11-26 NOTE — Progress Notes (Signed)
Subjective:   Cindy Byrd is a 80 y.o. female who presents for Medicare Annual (Subsequent) preventive examination.  Review of Systems    General:   No F/C, wt loss Pulm:   No DIB, inc SOB from baseline, pleuritic chest pain Card:  No CP, palpitations Abd:  No n/v/d or pain Ext:  No inc edema from baseline     Objective:    Today's Vitals   11/26/21 0830  BP: 119/79  Pulse: 74  Temp: (!) 97.5 F (36.4 C)  SpO2: 98%  Weight: 155 lb (70.3 kg)  Height: 5\' 9"  (1.753 m)   Body mass index is 22.89 kg/m.  Advanced Directives 08/21/2020 02/05/2020 10/16/2019 10/16/2019 10/08/2019 10/07/2019 04/01/2019  Does Patient Have a Medical Advance Directive? Yes Yes Yes Yes Yes Yes Yes  Type of Paramedic of [place];Living will [place];Living will [place];Living will;Out of facility DNR (pink MOST or yellow form) [place];Living will [place];Living will Living will Living will  Does patient want to make changes to medical advance directive? - No - Patient declined No - Patient declined No - Patient declined - No - Patient declined -  Copy of [place] in Chart? No - copy requested No - copy requested Yes - validated most recent copy scanned in chart (See row information) - No - copy requested No - copy requested -  Would patient like information on creating a medical advance directive? No - Patient declined - - - - - -  Pre-existing out of facility DNR order (yellow form or pink MOST form) - - Yellow form placed in chart (order not valid for inpatient use) - - - -    Current Medications (verified) Outpatient Encounter Medications as of 11/26/2021  Medication Sig   albuterol (VENTOLIN HFA) 108 (90 Base) MCG/ACT inhaler Inhale 2 puffs into the lungs every 6 (six) hours as needed for wheezing or shortness of breath.   amLODipine (NORVASC) 5 MG tablet Take 1 tablet (5 mg  total) by mouth daily.   Biotin 5 MG CAPS Take 5 mg by mouth daily.   busPIRone (BUSPAR) 10 MG tablet TAKE 1 TABLET BY MOUTH THREE TIMES A DAY   Calcium Citrate-Vitamin D (CALCIUM + D PO) Take 1 tablet by mouth daily.   Cyanocobalamin (B-12) 2500 MCG TABS Take 2,500 mcg by mouth daily.   FLUoxetine (PROZAC) 40 MG capsule TAKE 1 CAPSULE BY MOUTH EVERY DAY   nystatin (MYCOSTATIN) 100000 UNIT/ML suspension Take 5 mLs (500,000 Units total) by mouth 4 (four) times daily.   omeprazole (PRILOSEC) 40 MG capsule TAKE 1 CAPSULE BY MOUTH EVERY DAY BEFORE BREAKFAST   tobramycin-dexamethasone (TOBRADEX) ophthalmic solution SMARTSIG:In Eye(s)   traZODone (DESYREL) 50 MG tablet TAKE 1 TABLET BY MOUTH EVERYDAY AT BEDTIME   triamcinolone cream (KENALOG) 0.1 % APPLY TO AFFECTED AREA TWICE A DAY   XARELTO 20 MG TABS tablet TAKE 1 TABLET BY MOUTH DAILY WITH SUPPER   [DISCONTINUED] benzonatate (TESSALON PERLES) 100 MG capsule Take 2 capsules (200 mg total) by mouth 3 (three) times daily as needed for cough.   No facility-administered encounter medications on file as of 11/26/2021.    Allergies (verified) Penicillins, Remeron [mirtazapine], and Latex   History: Past Medical History:  Diagnosis Date   Barrett's esophagus    Bone neoplasm 06/24/2015   Cancer [facility])    metastatic poorly differentiated carcinoma. tumor left groin surgical removal with  radiation tx.   Cataract    BILATERAL   Cigarette smoker two packs a day or less    Currently still smoking 2 PPD - Not interested in quitting at this time.   Colitis 2017   Colon polyps    hyperplastic, tubular adenomas, tubulovillous adenoma   Cough, persistent    hx. lung cancer ? primary-being evaluated, unsure of primary site.   Depression 06/24/2015   Diverticulosis    Emphysema of lung ([place])    Endometriosis    Hysterectomy with BSO at age 48 yrs   Esophageal adenocarcinoma ([place]) 08/11/15   intramucosal   Gastritis    GERD (gastroesophageal reflux  disease)    H/O: pneumonia    Hiatal hernia    Hyperlipidemia    Hypertension 06/24/2015   likely improved incidental to 40 lbs weight loss from her neoplasm. No Longer taking med for this as of 08-06-15   IBS (irritable bowel syndrome)    Pain    left hip-persistent"tumor of bone"-radiation tx. 10.   Vitamin D deficiency disease    Past Surgical History:  Procedure Laterality Date   ABDOMINAL HYSTERECTOMY     BALLOON DILATION N/A 10/08/2019   Procedure: BALLOON DILATION;  Surgeon: [name], MD;  Location: [facility];  Service: Gastroenterology;  Laterality: N/A;   BARTHOLIN GLAND CYST EXCISION  80 yo ago   Does not want if it was an infected cyst or tumor. Was soon as delivery   BIOPSY  01/02/2019   Procedure: BIOPSY;  Surgeon: [name], MD;  Location: [facility];  Service: Gastroenterology;;   CATARACT EXTRACTION     COLONOSCOPY W/ POLYPECTOMY     multiple times - last done 09/2014 per patient.   ESOPHAGOGASTRODUODENOSCOPY (EGD) WITH PROPOFOL N/A 08/11/2015   Procedure: ESOPHAGOGASTRODUODENOSCOPY (EGD) WITH PROPOFOL;  Surgeon: [name], MD;  Location: [facility];  Service: Gastroenterology;  Laterality: N/A;   ESOPHAGOGASTRODUODENOSCOPY (EGD) WITH PROPOFOL N/A 01/02/2019   Procedure: ESOPHAGOGASTRODUODENOSCOPY (EGD) WITH PROPOFOL;  Surgeon: [name], MD;  Location: [facility];  Service: Gastroenterology;  Laterality: N/A;   ESOPHAGOGASTRODUODENOSCOPY (EGD) WITH PROPOFOL N/A 10/08/2019   Procedure: ESOPHAGOGASTRODUODENOSCOPY (EGD) WITH PROPOFOL;  Surgeon: [name], MD;  Location: [facility];  Service: Gastroenterology;  Laterality: N/A;   FLEXIBLE SIGMOIDOSCOPY N/A 06/24/2017   Procedure: FLEXIBLE SIGMOIDOSCOPY;  Surgeon: Manus Gunning, MD;  Location: [facility];  Service: Gastroenterology;  Laterality: N/A;   GANGLION CYST EXCISION     KNEE ARTHROSCOPY  age about 33 yrs   TONSILLECTOMY     TOTAL ABDOMINAL HYSTERECTOMY W/ BILATERAL  SALPINGOOPHORECTOMY  at age 31 yrs   For endometriosis   Family History  Problem Relation Age of Onset   Colon cancer Brother    Colon cancer Brother    Stroke Mother    Colon cancer Father    Emphysema Father        smoked   Breast cancer Daughter 75       ER/PR+ stage II   Social History   Socioeconomic History   Marital status: Widowed    Spouse name: Not on file   Number of children: 2   Years of education: Not on file   Highest education level: Not on file  Occupational History   Not on file  Tobacco Use   Smoking status: Former    Packs/day: 1.00    Years: 60.00    Pack years: 60.00    Types: Cigarettes    Quit  date: 12/05/2014    Years since quitting: 6.9   Smokeless tobacco: Never  Vaping Use   Vaping Use: Never used  Substance and Sexual Activity   Alcohol use: No    Alcohol/week: 0.0 standard drinks   Drug use: No   Sexual activity: Not Currently  Other Topics Concern   Not on file  Social History Narrative   Not on file   Social Determinants of Health   Financial Resource Strain: Not on file  Food Insecurity: Not on file  Transportation Needs: Not on file  Physical Activity: Not on file  Stress: Not on file  Social Connections: Not on file    Tobacco Counseling Counseling given: Not Answered    Diabetic? no   Activities of Daily Living In your present state of health, do you have any difficulty performing the following activities: 11/26/2021 06/22/2021  Hearing? Y N  Vision? Y Y  Difficulty concentrating or making decisions? Y N  Walking or climbing stairs? Y N  Dressing or bathing? N N  Doing errands, shopping? N N  Some recent data might be hidden    Patient Care Team: [name], PA-C as PCP - General (Physician Assistant) [name], MD as Consulting Physician (Hematology and Oncology)  Indicate any recent Medical Services you may have received from other than [facility] providers in the past year (date may be  approximate).     Assessment:   This is a routine wellness examination for [place].  Hearing/Vision screen No results found.  Dietary issues and exercise activities discussed: -Discussed heart healthy diet low in fat and carbohydrates. Patient reports appetite fluctuates, discussed protein intake. -Continue to stay as active as possible with house work.   Goals Addressed   None   Depression Screen PHQ 2/9 Scores 11/26/2021 06/22/2021 03/17/2021 02/23/2021 02/16/2021 11/18/2020 09/24/2020  PHQ - 2 Score 2 1 0 2 2 5 4   PHQ- 9 Score 11 6 5 6 5 12 11     Fall Risk Fall Risk  11/26/2021 06/22/2021 03/17/2021 02/23/2021 02/16/2021  Falls in the past year? 0 0 1 1 1   Number falls in past yr: 0 0 0 - 0  Injury with Fall? 0 0 0 - 0  Risk for fall due to : No Fall Risks No Fall Risks - - No Fall Risks  Follow up Falls evaluation completed Falls evaluation completed Falls evaluation completed Falls evaluation completed -    FALL RISK PREVENTION PERTAINING TO THE HOME:  Any stairs in or around the home? Yes  If so, are there any without handrails? No  Home free of loose throw rugs in walkways, pet beds, electrical cords, etc? No  Adequate lighting in your home to reduce risk of falls? Yes   ASSISTIVE DEVICES UTILIZED TO PREVENT FALLS:  Life alert? No  Use of a cane, walker or w/c? No  Grab bars in the bathroom? No  Shower chair or bench in shower? Yes  Elevated toilet seat or a handicapped toilet? No   TIMED UP AND GO:  Was the test performed? Yes .  Length of time to ambulate 10 feet: 14 sec.   Gait steady and fast without use of assistive device  Cognitive Function: wnl's     6CIT Screen 11/26/2021  What Year? 0 points  What month? 0 points  What time? 0 points  Count back from 20 0 points  Months in reverse 0 points  Repeat phrase 0 points  Total Score 0  Immunizations Immunization History  Administered Date(s) Administered   Fluad Quad(high Dose 65+) 09/04/2019,  09/24/2020   PFIZER(Purple Top)SARS-COV-2 Vaccination 07/09/2020, 10/06/2020   PPD Test 09/13/2016    TDAP status: Due, Education has been provided regarding the importance of this vaccine. Advised may receive this vaccine at local pharmacy or Health Dept. Aware to provide a copy of the vaccination record if obtained from local pharmacy or Health Dept. Verbalized acceptance and understanding.  Flu Vaccine status: Up to date  Pneumococcal vaccine status: Due, Education has been provided regarding the importance of this vaccine. Advised may receive this vaccine at local pharmacy or Health Dept. Aware to provide a copy of the vaccination record if obtained from local pharmacy or Health Dept. Verbalized acceptance and understanding.  Covid-19 vaccine status: Completed vaccines  Qualifies for Shingles Vaccine? Yes   Zostavax completed No   Shingrix Completed?: No.    Education has been provided regarding the importance of this vaccine. Patient has been advised to call insurance company to determine out of pocket expense if they have not yet received this vaccine. Advised may also receive vaccine at local pharmacy or Health Dept. Verbalized acceptance and understanding.  Screening Tests Health Maintenance  Topic Date Due   Pneumonia Vaccine 56+ Years old (1 - PCV) Never done   TETANUS/TDAP  Never done   Zoster Vaccines- Shingrix (1 of 2) Never done   COVID-19 Vaccine (3 - Pfizer risk series) 11/03/2020   DEXA SCAN  Completed   HPV VACCINES  Aged Out    Health Maintenance  Health Maintenance Due  Topic Date Due   Pneumonia Vaccine 77+ Years old (1 - PCV) Never done   TETANUS/TDAP  Never done   Zoster Vaccines- Shingrix (1 of 2) Never done   COVID-19 Vaccine (3 - Pfizer risk series) 11/03/2020    Colorectal cancer screening: Type of screening: Colonoscopy. Completed 06/15/2016. Repeat every 0 years  Mammogram status: No longer required due to age.  Bone density: Pt deferred.  Lung  Cancer Screening: (Low Dose CT Chest recommended if Age 61-80 years, 30 pack-year currently smoking OR have quit w/in 15years.) does qualify.   Lung Cancer Screening Referral: followed by oncology  Additional Screening:  Hepatitis C Screening: does not qualify; Patient declined screening.   Vision Screening: Recommended annual ophthalmology exams for early detection of glaucoma and other disorders of the eye. Is the patient up to date with their annual eye exam?  Yes  Who is the provider or what is the name of the office in which the patient attends annual eye exams? Dr. Gershon Crane  If pt is not established with a provider, would they like to be referred to a provider to establish care? No .   Dental Screening: Recommended annual dental exams for proper oral hygiene  Community Resource Referral / Chronic Care Management: CRR required this visit?  No   CCM required this visit?  No      Plan:  -Will obtain routine fasting labs. -Patient endorses depressive symptomology and has felt more sad since she has been living alone, is able to contract safety. Discussed treatment adjustments and wants to change Prozac to another SSRI. Patient does have chronic diarrhea so will change to Lexapro instead of Zoloft. Sent rx for Lexapro and rx for Prozac with tapering instructions. Patient is aware to start Lexapro after she stops Prozac. -Follow up in 8 weeks to reassess mood and medication therapy.  I have personally reviewed and noted the following in  the patients chart:   Medical and social history Use of alcohol, tobacco or illicit drugs  Current medications and supplements including opioid prescriptions.  Functional ability and status Nutritional status Physical activity Advanced directives List of other physicians Hospitalizations, surgeries, and ER visits in previous 12 months Vitals Screenings to include cognitive, depression, and falls Referrals and appointments  In addition, I have  reviewed and discussed with patient certain preventive protocols, quality metrics, and best practice recommendations. A written personalized care plan for preventive services as well as general preventive health recommendations were provided to patient.     [name], PA-C   11/26/2021

## 2021-11-26 NOTE — Patient Instructions (Signed)
Preventive Care 65 Years and Older, Female °Preventive care refers to lifestyle choices and visits with your health care provider that can promote health and wellness. Preventive care visits are also called wellness exams. °What can I expect for my preventive care visit? °Counseling °Your health care provider may ask you questions about your: °Medical history, including: °Past medical problems. °Family medical history. °Pregnancy and menstrual history. °History of falls. °Current health, including: °Memory and ability to understand (cognition). °Emotional well-being. °Home life and relationship well-being. °Sexual activity and sexual health. °Lifestyle, including: °Alcohol, nicotine or tobacco, and drug use. °Access to firearms. °Diet, exercise, and sleep habits. °Work and work environment. °Sunscreen use. °Safety issues such as seatbelt and bike helmet use. °Physical exam °Your health care provider will check your: °Height and weight. These may be used to calculate your BMI (body mass index). BMI is a measurement that tells if you are at a healthy weight. °Waist circumference. This measures the distance around your waistline. This measurement also tells if you are at a healthy weight and may help predict your risk of certain diseases, such as type 2 diabetes and high blood pressure. °Heart rate and blood pressure. °Body temperature. °Skin for abnormal spots. °What immunizations do I need? °Vaccines are usually given at various ages, according to a schedule. Your health care provider will recommend vaccines for you based on your age, medical history, and lifestyle or other factors, such as travel or where you work. °What tests do I need? °Screening °Your health care provider may recommend screening tests for certain conditions. This may include: °Lipid and cholesterol levels. °Hepatitis C test. °Hepatitis B test. °HIV (human immunodeficiency virus) test. °STI (sexually transmitted infection) testing, if you are at  risk. °Lung cancer screening. °Colorectal cancer screening. °Diabetes screening. This is done by checking your blood sugar (glucose) after you have not eaten for a while (fasting). °Mammogram. Talk with your health care provider about how often you should have regular mammograms. °BRCA-related cancer screening. This may be done if you have a family history of breast, ovarian, tubal, or peritoneal cancers. °Bone density scan. This is done to screen for osteoporosis. °Talk with your health care provider about your test results, treatment options, and if necessary, the need for more tests. °Follow these instructions at home: °Eating and drinking ° °Eat a diet that includes fresh fruits and vegetables, whole grains, lean protein, and low-fat dairy products. Limit your intake of foods with high amounts of sugar, saturated fats, and salt. °Take vitamin and mineral supplements as recommended by your health care provider. °Do not drink alcohol if your health care provider tells you not to drink. °If you drink alcohol: °Limit how much you have to 0-1 drink a day. °Know how much alcohol is in your drink. In the [place] one drink equals one 12 oz bottle of beer (355 mL), one 5 oz glass of wine (148 mL), or one 1½ oz glass of hard liquor (44 mL). °Lifestyle °Brush your teeth every morning and night with fluoride toothpaste. Floss one time each day. °Exercise for at least 30 minutes 5 or more days each week. °Do not use any products that contain nicotine or tobacco. These products include cigarettes, chewing tobacco, and vaping devices, such as e-cigarettes. If you need help quitting, ask your health care provider. °Do not use drugs. °If you are sexually active, practice safe sex. Use a condom or other form of protection in order to prevent STIs. °Take aspirin only as told by your   health care provider. Make sure that you understand how much to take and what form to take. Work with your health care provider to find out whether it  is safe and beneficial for you to take aspirin daily. Ask your health care provider if you need to take a cholesterol-lowering medicine (statin). Find healthy ways to manage stress, such as: Meditation, yoga, or listening to music. Journaling. Talking to a trusted person. Spending time with friends and family. Minimize exposure to UV radiation to reduce your risk of skin cancer. Safety Always wear your seat belt while driving or riding in a vehicle. Do not drive: If you have been drinking alcohol. Do not ride with someone who has been drinking. When you are tired or distracted. While texting. If you have been using any mind-altering substances or drugs. Wear a helmet and other protective equipment during sports activities. If you have firearms in your house, make sure you follow all gun safety procedures. What's next? Visit your health care provider once a year for an annual wellness visit. Ask your health care provider how often you should have your eyes and teeth checked. Stay up to date on all vaccines. This information is not intended to replace advice given to you by your health care provider. Make sure you discuss any questions you have with your health care provider. Document Revised: 05/19/2021 Document Reviewed: 05/19/2021 Elsevier Patient Education  [place].

## 2021-11-27 LAB — VITAMIN D 25 HYDROXY (VIT D DEFICIENCY, FRACTURES): Vit D, 25-Hydroxy: 23.5 ng/mL — ABNORMAL LOW (ref 30.0–100.0)

## 2021-11-27 LAB — CBC WITH DIFFERENTIAL/PLATELET
Basophils Absolute: 0.1 10*3/uL (ref 0.0–0.2)
Basos: 1 %
EOS (ABSOLUTE): 0.2 10*3/uL (ref 0.0–0.4)
Eos: 3 %
Hematocrit: 37.4 % (ref 34.0–46.6)
Hemoglobin: 12.4 g/dL (ref 11.1–15.9)
Immature Grans (Abs): 0 10*3/uL (ref 0.0–0.1)
Immature Granulocytes: 0 %
Lymphocytes Absolute: 1.7 10*3/uL (ref 0.7–3.1)
Lymphs: 19 %
MCH: 29.8 pg (ref 26.6–33.0)
MCHC: 33.2 g/dL (ref 31.5–35.7)
MCV: 90 fL (ref 79–97)
Monocytes Absolute: 0.7 10*3/uL (ref 0.1–0.9)
Monocytes: 8 %
Neutrophils Absolute: 6 10*3/uL (ref 1.4–7.0)
Neutrophils: 69 %
Platelets: 240 10*3/uL (ref 150–450)
RBC: 4.16 x10E6/uL (ref 3.77–5.28)
RDW: 13.5 % (ref 11.7–15.4)
WBC: 8.7 10*3/uL (ref 3.4–10.8)

## 2021-11-27 LAB — COMPREHENSIVE METABOLIC PANEL
ALT: 11 IU/L (ref 0–32)
AST: 21 IU/L (ref 0–40)
Albumin/Globulin Ratio: 1.2 (ref 1.2–2.2)
Albumin: 4 g/dL (ref 3.6–4.6)
Alkaline Phosphatase: 67 IU/L (ref 44–121)
BUN/Creatinine Ratio: 16 (ref 12–28)
BUN: 20 mg/dL (ref 8–27)
Bilirubin Total: 0.3 mg/dL (ref 0.0–1.2)
CO2: 22 mmol/L (ref 20–29)
Calcium: 9.4 mg/dL (ref 8.7–10.3)
Chloride: 105 mmol/L (ref 96–106)
Creatinine, Ser: 1.28 mg/dL — ABNORMAL HIGH (ref 0.57–1.00)
Globulin, Total: 3.3 g/dL (ref 1.5–4.5)
Glucose: 122 mg/dL — ABNORMAL HIGH (ref 70–99)
Potassium: 4.5 mmol/L (ref 3.5–5.2)
Sodium: 140 mmol/L (ref 134–144)
Total Protein: 7.3 g/dL (ref 6.0–8.5)
eGFR: 42 mL/min/{1.73_m2} — ABNORMAL LOW (ref >59)

## 2021-11-27 LAB — LIPID PANEL
Chol/HDL Ratio: 3.9 ratio (ref 0.0–4.4)
Cholesterol, Total: 171 mg/dL (ref 100–199)
HDL: 44 mg/dL (ref >39)
LDL Chol Calc (NIH): 104 mg/dL — ABNORMAL HIGH (ref 0–99)
Triglycerides: 127 mg/dL (ref 0–149)
VLDL Cholesterol Cal: 23 mg/dL (ref 5–40)

## 2021-11-27 LAB — HEMOGLOBIN A1C
Est. average glucose Bld gHb Est-mCnc: 143 mg/dL
Hgb A1c MFr Bld: 6.6 % — ABNORMAL HIGH (ref 4.8–5.6)

## 2021-12-10 ENCOUNTER — Other Ambulatory Visit: Payer: Medicare Other

## 2021-12-10 ENCOUNTER — Ambulatory Visit: Payer: Medicare Other

## 2021-12-10 ENCOUNTER — Inpatient Hospital Stay: Payer: Medicare Other | Admitting: Hematology

## 2021-12-10 ENCOUNTER — Other Ambulatory Visit: Payer: Self-pay | Admitting: Physician Assistant

## 2021-12-10 ENCOUNTER — Inpatient Hospital Stay: Payer: Medicare Other | Attending: Hematology

## 2021-12-10 ENCOUNTER — Other Ambulatory Visit: Payer: Self-pay

## 2021-12-10 ENCOUNTER — Inpatient Hospital Stay: Payer: Medicare Other

## 2021-12-10 VITALS — BP 138/61 | HR 79 | Temp 97.5°F | Resp 18 | Wt 152.6 lb

## 2021-12-10 DIAGNOSIS — Z7189 Other specified counseling: Secondary | ICD-10-CM

## 2021-12-10 DIAGNOSIS — C155 Malignant neoplasm of lower third of esophagus: Secondary | ICD-10-CM

## 2021-12-10 DIAGNOSIS — C3490 Malignant neoplasm of unspecified part of unspecified bronchus or lung: Secondary | ICD-10-CM

## 2021-12-10 DIAGNOSIS — R197 Diarrhea, unspecified: Secondary | ICD-10-CM

## 2021-12-10 DIAGNOSIS — C3491 Malignant neoplasm of unspecified part of right bronchus or lung: Secondary | ICD-10-CM

## 2021-12-10 DIAGNOSIS — Z95828 Presence of other vascular implants and grafts: Secondary | ICD-10-CM

## 2021-12-10 DIAGNOSIS — K521 Toxic gastroenteritis and colitis: Secondary | ICD-10-CM | POA: Diagnosis not present

## 2021-12-10 DIAGNOSIS — F330 Major depressive disorder, recurrent, mild: Secondary | ICD-10-CM

## 2021-12-10 DIAGNOSIS — C7951 Secondary malignant neoplasm of bone: Secondary | ICD-10-CM | POA: Insufficient documentation

## 2021-12-10 DIAGNOSIS — K529 Noninfective gastroenteritis and colitis, unspecified: Secondary | ICD-10-CM

## 2021-12-10 LAB — CMP (CANCER CENTER ONLY)
ALT: 9 U/L (ref 0–44)
AST: 16 U/L (ref 15–41)
Albumin: 3.8 g/dL (ref 3.5–5.0)
Alkaline Phosphatase: 56 U/L (ref 38–126)
Anion gap: 8 (ref 5–15)
BUN: 23 mg/dL (ref 8–23)
CO2: 23 mmol/L (ref 22–32)
Calcium: 9.4 mg/dL (ref 8.9–10.3)
Chloride: 108 mmol/L (ref 98–111)
Creatinine: 1.32 mg/dL — ABNORMAL HIGH (ref 0.44–1.00)
GFR, Estimated: 41 mL/min — ABNORMAL LOW (ref >60)
Glucose, Bld: 108 mg/dL — ABNORMAL HIGH (ref 70–99)
Potassium: 4.2 mmol/L (ref 3.5–5.1)
Sodium: 139 mmol/L (ref 135–145)
Total Bilirubin: 0.3 mg/dL (ref 0.3–1.2)
Total Protein: 8 g/dL (ref 6.5–8.1)

## 2021-12-10 LAB — CBC WITH DIFFERENTIAL (CANCER CENTER ONLY)
Abs Immature Granulocytes: 0.06 10*3/uL (ref 0.00–0.07)
Basophils Absolute: 0.1 10*3/uL (ref 0.0–0.1)
Basophils Relative: 1 %
Eosinophils Absolute: 0.2 10*3/uL (ref 0.0–0.5)
Eosinophils Relative: 2 %
HCT: 39.2 % (ref 36.0–46.0)
Hemoglobin: 12.4 g/dL (ref 12.0–15.0)
Immature Granulocytes: 1 %
Lymphocytes Relative: 20 %
Lymphs Abs: 1.7 10*3/uL (ref 0.7–4.0)
MCH: 29.2 pg (ref 26.0–34.0)
MCHC: 31.6 g/dL (ref 30.0–36.0)
MCV: 92.2 fL (ref 80.0–100.0)
Monocytes Absolute: 0.7 10*3/uL (ref 0.1–1.0)
Monocytes Relative: 8 %
Neutro Abs: 5.9 10*3/uL (ref 1.7–7.7)
Neutrophils Relative %: 68 %
Platelet Count: 237 10*3/uL (ref 150–400)
RBC: 4.25 MIL/uL (ref 3.87–5.11)
RDW: 14.5 % (ref 11.5–15.5)
WBC Count: 8.5 10*3/uL (ref 4.0–10.5)
nRBC: 0 % (ref 0.0–0.2)

## 2021-12-10 MED ORDER — SODIUM CHLORIDE 0.9% FLUSH
10.0000 mL | Freq: Once | INTRAVENOUS | Status: AC
  Administered 2021-12-10: 10 mL

## 2021-12-10 MED ORDER — AZITHROMYCIN 250 MG PO TABS: ORAL_TABLET | ORAL | 0 refills | Status: DC

## 2021-12-10 MED ORDER — HEPARIN SOD (PORK) LOCK FLUSH 100 UNIT/ML IV SOLN: 500.0000 [IU] | Freq: Once | INTRAVENOUS | Status: DC

## 2021-12-10 MED ORDER — HEPARIN SOD (PORK) LOCK FLUSH 100 UNIT/ML IV SOLN
500.0000 [IU] | Freq: Once | INTRAVENOUS | Status: AC
  Administered 2021-12-10: 500 [IU]

## 2021-12-10 MED ORDER — OCTREOTIDE ACETATE 30 MG IM KIT
30.0000 mg | PACK | Freq: Once | INTRAMUSCULAR | Status: AC
  Administered 2021-12-10: 30 mg via INTRAMUSCULAR
  Filled 2021-12-10: qty 1

## 2021-12-10 MED ORDER — SODIUM CHLORIDE 0.9% FLUSH: 10.0000 mL | INTRAVENOUS | Status: DC | PRN

## 2021-12-13 ENCOUNTER — Telehealth: Payer: Self-pay | Admitting: Hematology

## 2021-12-13 ENCOUNTER — Other Ambulatory Visit: Payer: Self-pay | Admitting: Physician Assistant

## 2021-12-13 DIAGNOSIS — F330 Major depressive disorder, recurrent, mild: Secondary | ICD-10-CM

## 2021-12-13 NOTE — Telephone Encounter (Signed)
Left message with follow-up appointments per 1/6 los.

## 2021-12-16 ENCOUNTER — Ambulatory Visit (HOSPITAL_COMMUNITY): Payer: Medicare Other

## 2021-12-16 ENCOUNTER — Encounter: Payer: Self-pay | Admitting: Hematology

## 2021-12-16 ENCOUNTER — Ambulatory Visit (HOSPITAL_COMMUNITY)
Admission: RE | Admit: 2021-12-16 | Discharge: 2021-12-16 | Disposition: A | Discharge status: Home / Self Care | Payer: Medicare Other | Source: Ambulatory Visit | Attending: Hematology | Admitting: Hematology

## 2021-12-16 ENCOUNTER — Other Ambulatory Visit: Payer: Self-pay

## 2021-12-16 DIAGNOSIS — I2510 Atherosclerotic heart disease of native coronary artery without angina pectoris: Secondary | ICD-10-CM | POA: Diagnosis not present

## 2021-12-16 DIAGNOSIS — C3490 Malignant neoplasm of unspecified part of unspecified bronchus or lung: Secondary | ICD-10-CM | POA: Diagnosis not present

## 2021-12-16 DIAGNOSIS — K449 Diaphragmatic hernia without obstruction or gangrene: Secondary | ICD-10-CM | POA: Diagnosis not present

## 2021-12-16 DIAGNOSIS — C159 Malignant neoplasm of esophagus, unspecified: Secondary | ICD-10-CM | POA: Diagnosis not present

## 2021-12-16 DIAGNOSIS — K5730 Diverticulosis of large intestine without perforation or abscess without bleeding: Secondary | ICD-10-CM | POA: Diagnosis not present

## 2021-12-16 DIAGNOSIS — C155 Malignant neoplasm of lower third of esophagus: Secondary | ICD-10-CM | POA: Insufficient documentation

## 2021-12-16 DIAGNOSIS — C3491 Malignant neoplasm of unspecified part of right bronchus or lung: Secondary | ICD-10-CM | POA: Diagnosis not present

## 2021-12-16 DIAGNOSIS — K8020 Calculus of gallbladder without cholecystitis without obstruction: Secondary | ICD-10-CM | POA: Diagnosis not present

## 2021-12-16 MED ORDER — SODIUM CHLORIDE (PF) 0.9 % IJ SOLN
INTRAMUSCULAR | Status: AC
  Filled 2021-12-16: qty 50

## 2021-12-16 MED ORDER — IOHEXOL 9 MG/ML PO SOLN
ORAL | Status: AC
  Administered 2021-12-16: 500 mL via ORAL
  Filled 2021-12-16: qty 500

## 2021-12-16 MED ORDER — IOHEXOL 300 MG/ML  SOLN
100.0000 mL | Freq: Once | INTRAMUSCULAR | Status: AC | PRN
  Administered 2021-12-16: 80 mL via INTRAVENOUS

## 2021-12-16 MED ORDER — IOHEXOL 9 MG/ML PO SOLN
500.0000 mL | ORAL | Status: AC
  Administered 2021-12-16: 500 mL via ORAL

## 2021-12-16 NOTE — Progress Notes (Addendum)
HEMATOLOGY ONCOLOGY PROGRESS NOTE  Date of service:  .12/10/2021   Patient Care Team: [name], PA-C as PCP - General (Physician Assistant) [name], MD as Consulting Physician (Hematology and Oncology)  CHIEF COMPLAINTS/PURPOSE OF CONSULTATION:  Follow-up for metastatic lung cancer Follow-up for esophageal cancer  DIAGNOSIS:   #1 Metastatic non-small cell lung cancer with bilateral lung nodules and large metastatic lesion in the left Ilium. 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 Also had c diff colitis - treated   Current Treatment  1) Active surveillance 2) Xgeva 137m [place] q12weeks for bone metastases. 3) Sandostatin q4weeks for diarrhea   Previous Treatment  For metastatic lung cancer 1 Palliative radiation therapy to the large left ilium metastases 2. IV Nivolumab x 20 cycles (discontinued due to likely immune colitis) 3. Xgeva 1240mSC q4weeks for bone metastases.  For Esophageal adenocarcinoma S/p Concurrent carbo/taxol + RT  HISTORY OF PRESENTING ILLNESS: (plz see my previous consultation for details of initial presentation)  INTERVAL HISTORY:   [name] here for her scheduled follow-up for metastatic lung cancer and her adenocarcinoma of the esophagus.  She notes she postponed her CT chest abdomen pelvis but will schedule this next week. She notes no new dysphagia.  No heartburns. No new shortness of breath or chest pain. Mild chronic fatigue. She has remained off her cigarettes. No other acute new focal symptoms. Weight has been relatively steady. Labs done today show normal CBC and stable CMP.   MEDICAL HISTORY:  Past Medical History:  Diagnosis Date   Barrett's esophagus    Bone neoplasm 06/24/2015   [facility]   metastatic poorly differentiated carcinoma. tumor left groin surgical removal with radiation tx.   Cataract    BILATERAL   Cigarette smoker two  packs a day or less    Currently still smoking 2 PPD - Not interested in quitting at this time.   Colitis 2017   Colon polyps    hyperplastic, tubular adenomas, tubulovillous adenoma   Cough, persistent    hx. lung cancer ? primary-being evaluated, unsure of primary site.   Depression 06/24/2015   Diverticulosis    [place]   Endometriosis    Hysterectomy with BSO at age [age]rs   Esophageal [facility]   intramucosal   Gastritis    GERD (gastroesophageal reflux disease)    H/O: pneumonia    Hiatal hernia    Hyperlipidemia    Hypertension 06/24/2015   likely improved incidental to 40 lbs weight loss from her neoplasm. No Longer taking med for this as of 08-06-15   IBS (irritable bowel syndrome)    Pain    left hip-persistent"tumor of bone"-radiation tx. 10.   Vitamin D deficiency disease    SURGICAL HISTORY: Past Surgical History:  Procedure Laterality Date   ABDOMINAL HYSTERECTOMY     BALLOON DILATION N/A 10/08/2019   Procedure: BALLOON DILATION;  Surgeon: PyJerene BearsMD;  Location: [facility];  Service: Gastroenterology;  Laterality: N/A;   BARTHOLIN GLAND CYST EXCISION  524o ago   Does not want if it was an infected cyst or tumor. Was soon as delivery   BIOPSY  01/02/2019   Procedure: BIOPSY;  Surgeon: PyJerene BearsMD;  Location: [facility];  Service: Gastroenterology;;   CATARACT EXTRACTION     COLONOSCOPY W/ POLYPECTOMY     multiple times - last done 09/2014 per  patient.   ESOPHAGOGASTRODUODENOSCOPY (EGD) WITH PROPOFOL N/A 08/11/2015   Procedure: ESOPHAGOGASTRODUODENOSCOPY (EGD) WITH PROPOFOL;  Surgeon: [name], MD;  Location: [facility];  Service: Gastroenterology;  Laterality: N/A;   ESOPHAGOGASTRODUODENOSCOPY (EGD) WITH PROPOFOL N/A 01/02/2019   Procedure: ESOPHAGOGASTRODUODENOSCOPY (EGD) WITH PROPOFOL;  Surgeon: [name], MD;  Location: [facility];  Service: Gastroenterology;  Laterality: N/A;    ESOPHAGOGASTRODUODENOSCOPY (EGD) WITH PROPOFOL N/A 10/08/2019   Procedure: ESOPHAGOGASTRODUODENOSCOPY (EGD) WITH PROPOFOL;  Surgeon: [name], MD;  Location: [facility];  Service: Gastroenterology;  Laterality: N/A;   FLEXIBLE SIGMOIDOSCOPY N/A 06/24/2017   Procedure: FLEXIBLE SIGMOIDOSCOPY;  Surgeon: Manus Gunning, MD;  Location: [facility];  Service: Gastroenterology;  Laterality: N/A;   GANGLION CYST EXCISION     KNEE ARTHROSCOPY  age about 69 yrs   TONSILLECTOMY     TOTAL ABDOMINAL HYSTERECTOMY W/ BILATERAL SALPINGOOPHORECTOMY  at age 77 yrs   For endometriosis    SOCIAL HISTORY: Social History   Socioeconomic History   Marital status: Widowed    Spouse name: Not on file   Number of children: 2   Years of education: Not on file   Highest education level: Not on file  Occupational History   Not on file  Tobacco Use   Smoking status: Former    Packs/day: 1.00    Years: 60.00    Pack years: 60.00    Types: Cigarettes    Quit date: 12/05/2014    Years since quitting: 7.0   Smokeless tobacco: Never  Vaping Use   Vaping Use: Never used  Substance and Sexual Activity   Alcohol use: No    Alcohol/week: 0.0 standard drinks   Drug use: No   Sexual activity: Not Currently  Other Topics Concern   Not on file  Social History Narrative   Not on file   Social Determinants of Health   Financial Resource Strain: Not on file  Food Insecurity: Not on file  Transportation Needs: Not on file  Physical Activity: Not on file  Stress: Not on file  Social Connections: Not on file  Intimate Partner Violence: Not on file    FAMILY HISTORY: Family History  Problem Relation Age of Onset   Colon cancer Brother    Colon cancer Brother    Stroke Mother    Colon cancer Father    Emphysema Father        smoked   Breast cancer Daughter 76       ER/PR+ stage II    ALLERGIES:  is allergic to penicillins, remeron [mirtazapine], and latex. patient wonders if she has a  penicillin allergy but notes that she is uncertain about this.  MEDICATIONS:  Current Outpatient Medications  Medication Sig Dispense Refill   azithromycin (ZITHROMAX) 250 MG tablet 2 tablets [500 mg] on day 1 and then 1 tablet [250 mg] from day 2 to day 5. 6 each 0   albuterol (VENTOLIN HFA) 108 (90 Base) MCG/ACT inhaler Inhale 2 puffs into the lungs every 6 (six) hours as needed for wheezing or shortness of breath. 8 g 2   amLODipine (NORVASC) 5 MG tablet Take 1 tablet (5 mg total) by mouth daily. 90 tablet 1   Biotin 5 MG CAPS Take 5 mg by mouth daily.     busPIRone (BUSPAR) 10 MG tablet TAKE 1 TABLET BY MOUTH THREE TIMES A DAY 270 tablet 0   Calcium Citrate-Vitamin D (CALCIUM + D PO) Take 1 tablet by mouth daily.  Cyanocobalamin (B-12) 2500 MCG TABS Take 2,500 mcg by mouth daily.     escitalopram (LEXAPRO) 10 MG tablet Take 0.5 tablet by mouth daily x 7 days. Then take 1 tablet by mouth daily. 90 tablet 0   FLUoxetine (PROZAC) 10 MG capsule Take 3 capsules by mouth daily x 7 days. Take 2 capsules by mouth daily x 7 days. Take 1 capsule by mouth daily x 7 days and discontinue. 42 capsule 0   nystatin (MYCOSTATIN) 100000 UNIT/ML suspension Take 5 mLs (500,000 Units total) by mouth 4 (four) times daily. 200 mL 0   omeprazole (PRILOSEC) 40 MG capsule TAKE 1 CAPSULE BY MOUTH EVERY DAY BEFORE BREAKFAST 90 capsule 1   tobramycin-dexamethasone (TOBRADEX) ophthalmic solution SMARTSIG:In Eye(s)     traZODone (DESYREL) 50 MG tablet TAKE 1 TABLET BY MOUTH EVERYDAY AT BEDTIME 90 tablet 2   triamcinolone cream (KENALOG) 0.1 % APPLY TO AFFECTED AREA TWICE A DAY 30 g 0   XARELTO 20 MG TABS tablet TAKE 1 TABLET BY MOUTH DAILY WITH SUPPER 30 tablet 2   No current facility-administered medications for this visit.    REVIEW OF SYSTEMS:   .10 Point review of Systems was done is negative except as noted above. PHYSICAL EXAMINATION: ECOG PERFORMANCE STATUS: 2 - Symptomatic, <50% confined to bed  Vitals:    12/10/21 1100  BP: 138/61  Pulse: 79  Resp: 18  Temp: (!) 97.5 F (36.4 C)  SpO2: 97%    Filed Weights   12/10/21 1100  Weight: 152 lb 9.6 oz (69.2 kg)   .  Wt Readings from Last 3 Encounters:  12/10/21 152 lb 9.6 oz (69.2 kg)  11/26/21 155 lb (70.3 kg)  09/17/21 158 lb (71.7 kg)  . GENERAL:alert, in no acute distress and comfortable SKIN: no acute rashes, no significant lesions EYES: conjunctiva are pink and non-injected, sclera anicteric OROPHARYNX: MMM, no exudates, no oropharyngeal erythema or ulceration NECK: supple, no JVD LYMPH:  no palpable lymphadenopathy in the cervical, axillary or inguinal regions LUNGS: clear to auscultation b/l with normal respiratory effort HEART: regular rate & rhythm ABDOMEN:  normoactive bowel sounds , non tender, not distended. Extremity: no pedal edema PSYCH: alert & oriented x 3 with fluent speech NEURO: no focal motor/sensory deficits   LABORATORY DATA:  I have reviewed the data as listed  . CBC Latest Ref Rng & Units 12/10/2021 11/26/2021 11/12/2021  WBC 4.0 - 10.5 K/uL 8.5 8.7 7.8  Hemoglobin 12.0 - 15.0 g/dL 12.4 12.4 13.0  Hematocrit 36.0 - 46.0 % 39.2 37.4 40.7  Platelets 150 - 400 K/uL 237 240 219   ANC 1.8k . CMP Latest Ref Rng & Units 12/10/2021 11/26/2021 11/12/2021  Glucose 70 - 99 mg/dL 108(H) 122(H) 110(H)  BUN 8 - 23 mg/dL 23 20 17   Creatinine 0.44 - 1.00 mg/dL 1.32(H) 1.28(H) 1.57(H)  Sodium 135 - 145 mmol/L 139 140 139  Potassium 3.5 - 5.1 mmol/L 4.2 4.5 4.3  Chloride 98 - 111 mmol/L 108 105 103  CO2 22 - 32 mmol/L 23 22 25   Calcium 8.9 - 10.3 mg/dL 9.4 9.4 9.0  Total Protein 6.5 - 8.1 g/dL 8.0 7.3 8.1  Total Bilirubin 0.3 - 1.2 mg/dL 0.3 0.3 0.4  Alkaline Phos 38 - 126 U/L 56 67 66  AST 15 - 41 U/L 16 21 21   ALT 0 - 44 U/L 9 11 10         01/02/19 Esophagus Biopsy:    RADIOGRAPHIC STUDIES:  ASSESSMENT & PLAN:  81 y.o. female with  #1 Metastatic poorly differentiated carcinoma with likely lung  primary non-small cell lung cancer.    CT of the head with and without contrast showed no evidence of metastatic disease. EGFR blood test mutation analysis negative. CT chest abdomen pelvis 04/19/2016 shows no evidence of disease progression. Patient tolerated Nivolumab very well but was discontinued when she developed grade 2 Immune colitis. Has been off Nivolumab for >6 months  CT chest abdomen pelvis on 06/24/2016 shows no evidence of new disease or progression of metastatic disease. CT chest abdomen pelvis 09/06/2016 shows 1. Mixed interval response to therapy. 2. There is a new left ventral chest wall lesion deep to the pectoralis musculature worrisome for metastatic disease. 3. Posterior lower lobe nodular densities are identified which may reflect areas of pulmonary metastasis. 4. Interval decrease in size of destructive lesion involving the left iliac bone.  CT chest abd pelvis 12/08/2016: Cystic mass involving the left ventral chest wall has resolved in the interval. Likely was a hematoma due to trauma. Interval increase in size of pleural base mass overlying the posterior and inferior left lower lobe. There is also a new left pleural effusion identified.  CT chest 02/01/2017: Residual irregular soft tissue thickening/volume loss and trace left pleural fluid at the base of the left hemithorax, overall improved in appearance from 12/08/2016. No measurable lesion.  CT chest 05/29/2017 shows no residual pleural based mass or significant pleural effusion in the left hemithorax. No evidence of thoracic metastatic disease. No evidence of progressive metastatic disease within the abdomen or pelvis. Mixed lytic and blastic lesion involving the left iliac bone and associated pathologic fracture are unchanged.   CT CAP 09/14/17 shows no new changes. She does have slight displacement of her fractured left iliac bone. Evidence of stable disease.   CT CAP 01/04/2018- No new or progressive metastatic disease.  Stable large left iliac bone metastasis with associated chronic pathologic fracture.   CT chest/abd/pelvis done on 04/26/18 revealed Stable exam.  No new or progressive interval findings.  07/19/18 CT C/A/P revealed Stable exam.  No new or progressive interval findings. Large destructive left iliac lesion is similar to prior. Aortic Atherosclerosis and Emphysema.    11/06/18 CT C/A/P revealed Similar appearance of large mixed lytic and sclerotic lesion in the left ilium. No new metastatic lesions are otherwise noted elsewhere in the chest, abdomen or pelvis. 2. Interval development of thickening of the distal third of the esophagus. This is nonspecific, and could be related to underlying reflux esophagitis. However, if there is any clinical concern for Barrett's metaplasia or esophageal neoplasia, further evaluation with nonemergent endoscopy could be considered. 3. Aortic atherosclerosis, in addition to left main coronary artery disease. Assessment for potential risk factor modification, dietary therapy or pharmacologic therapy may be warranted, if clinically Indicated. 4. Diffuse bronchial wall thickening with mild to moderate centrilobular and paraseptal emphysema; imaging findings suggestive of underlying COPD. 5. Additional incidental findings, as above.  #2  Adenocarcinoma of the Esophagus  Barrett's esophagus 4cms in the distal esophagus with low and high-grade dysplasia  01/02/19 Surgical pathology revealed adenocarcinoma of the esophagus   01/25/19 PET/CT revealed Distal esophageal primary, without hypermetabolic metastatic disease. 2. Chronic left iliac metastasis, as before. 3. Hypermetabolism within and superficial to the right gluteal musculature is most likely related to trauma and/or injection sites. 4. Aortic atherosclerosis, coronary artery atherosclerosis and emphysema.  S/p concurrent Carboplatin and Taxol weekly with RT of 45 Gy in 25 fractions and 5.4 Gy  boost, completed between 02/04/19  and 03/27/19  07/03/2019 PET skull base to thigh revealed "1. Interval response to therapy. Significant reduction in FDG uptake associated with distal esophageal mass. SUV max currently 2.61 versus 16.97 previously. 2. 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 Unchanged 3. Aortic Atherosclerosis (ICD10-I70.0) and Emphysema (ICD10-J43.9). Coronary artery calcifications."  07/15/2020 CT C/A/P ([id number]) ([id number]) revealed "1. No evidence of new or progressive metastatic disease in the chest, abdomen or pelvis."  #3 diarrhea-  now resolved was previously. 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 She was previously on on Lialda, budesonide,probiotics and lomotil but not currently taking any of these. Plan -Continue Sandostatin every 4 weeks   #4 h/o DVT and PE  -continue on Xarelto - no issues with bleeding   #4 history of COPD management per primary care physician  PLAN: -Patients labs done today were discussed in detail CBC and CMP are stable. -Patient has no clinical symptoms suggestive of progression of her lung cancer or esophageal adenocarcinoma. -Gastroenterology has recommended she have repeat EGD if there are new dysphagia/swallowing symptoms.  She currently notes no significant dysphagia. -She will continue her Xgeva every 12 months.  No dental or other primary toxicities. -Continue Sandostatin every 4 weeks to control her multifactorial diarrhea.  -Continue attention to nutrition and continue daily multivitamin. -Continue vitamin D 50,000 units weekly ergocalciferol.  FOLLOW UP:  -continue Sandostatin q4weeks plz schedule next 4 doses -Xgeva every 12 weeks with labs - plz schedule next 3 doses. -phone visit in 3 weeks with Dr Irene Limbo -CT chest abdomen pelvis in 1 week    All of the patient's questions were answered with apparent  satisfaction. The patient knows to call the clinic with any problems, questions or concerns.   Sullivan Lone MD MS Hematology/Oncology Physician [facility]     Addendum Patient had CT chest abdomen pelvis on 12/16/2021 which showed stable exam with no evidence of new or progressive disease within the chest abdomen or pelvis.  .CT CHEST ABDOMEN PELVIS W CONTRAST  Result Date: 12/17/2021 CLINICAL DATA:  Metastatic lung cancer status post immunotherapy and left iliac bone radiation therapy completed 2017. Distal third esophagus cancer status post chemo radiation therapy. Restaging/follow-up. EXAM: CT CHEST, ABDOMEN, AND PELVIS WITH CONTRAST TECHNIQUE: Multidetector CT imaging of the chest, abdomen and pelvis was performed following the standard protocol during bolus administration of intravenous contrast. RADIATION DOSE REDUCTION: This exam was performed according to the departmental dose-optimization program which includes automated exposure control, adjustment of the mA and/or kV according to patient size and/or use of iterative reconstruction technique. CONTRAST:  60m OMNIPAQUE IOHEXOL 300 MG/ML  SOLN COMPARISON:  Multiple priors including most recent CT May 20, 2021 and February 25, 2021 FINDINGS: CT CHEST FINDINGS Cardiovascular: Right chest Port-A-Cath with tip at the superior cavoatrial junction. Aortic and branch vessel atherosclerosis without abdominal aortic aneurysm. No central pulmonary embolus on this nondedicated study. Coronary artery calcifications. Normal size heart. No significant pericardial effusion/thickening. Mediastinum/Nodes: No supraclavicular adenopathy. No discrete thyroid nodule. No pathologically enlarged mediastinal, hilar or axillary lymph nodes. Similar patulous appearance of the distal esophagus with mild circumferential wall thickening but no discrete esophageal mass. Lungs/Pleura: Mild diffuse bronchial wall thickening with moderate centrilobular emphysema.  Similar biapical pleuroparenchymal scarring. Stable bibasilar and scattered focal areas of pulmonary scarring. No suspicious pulmonary nodules or masses. No pleural effusion. No  pneumothorax. Musculoskeletal: No suspicious chest wall mass. No aggressive lytic or blastic lesion of bone. Healed remote anterior right sixth rib fracture and posterior left tenth and eleventh rib fractures. CT ABDOMEN PELVIS FINDINGS Hepatobiliary: Scattered small hepatic cysts measuring up to 1 cm. Additional tiny hypodense hepatic lesions which are too small to accurately characterize but statistically likely to reflect cysts. No solid enhancing hepatic lesion. Cholelithiasis and sludge without findings of acute cholecystitis. No biliary ductal dilation. Pancreas: No pancreatic ductal dilation or evidence of acute inflammation. Spleen: Normal in size without focal abnormality. Adrenals/Urinary Tract: Bilateral adrenal glands are unremarkable. No hydronephrosis. Scattered bilateral subcentimeter hypodense renal lesions are technically too small to accurately characterize but unchanged and considered benign. Urinary bladder is unremarkable for degree of distension. Stomach/Bowel: Radiopaque enteric contrast material traverses the hepatic flexure. Small hiatal hernia. Stomach is unremarkable for degree of distension. Periampullary duodenal diverticulum. No pathologic dilation of large or small bowel. The terminal ileum and appendix appears normal. Colonic diverticulosis without findings of acute diverticulitis. Vascular/Lymphatic: Aortic and branch vessel atherosclerosis without abdominal aortic aneurysm. The portal, splenic and superior mesenteric veins are patent. No pathologically enlarged abdominal or pelvic lymph nodes. Reproductive: Status post hysterectomy. No adnexal masses. Other: No significant abdominopelvic free fluid. Musculoskeletal: No significant interval change in the previously treated large expansile mixed lytic and  sclerotic left iliac bone lesion with chronic endplate completely healed pathologic fracture medially. IMPRESSION: 1. Stable examination including mild circumferential wall thickening of a patulous lower thoracic esophagus without discrete mass. No evidence of new or progressive disease within the chest, abdomen, or pelvis. 2. Cholelithiasis and sludge without findings of acute cholecystitis. 3. Colonic diverticulosis without findings of acute diverticulitis. 4. Aortic Atherosclerosis (ICD10-I70.0) and Emphysema (ICD10-J43.9). Electronically Signed   By: Dahlia Bailiff M.D.   On: 12/17/2021 08:23

## 2021-12-17 ENCOUNTER — Other Ambulatory Visit: Payer: Self-pay | Admitting: Hematology

## 2021-12-17 DIAGNOSIS — I10 Essential (primary) hypertension: Secondary | ICD-10-CM

## 2021-12-17 NOTE — Telephone Encounter (Signed)
This refill was sent to [place] in error.

## 2021-12-21 DIAGNOSIS — H524 Presbyopia: Secondary | ICD-10-CM | POA: Diagnosis not present

## 2021-12-21 DIAGNOSIS — H52203 Unspecified astigmatism, bilateral: Secondary | ICD-10-CM | POA: Diagnosis not present

## 2021-12-21 DIAGNOSIS — H5213 Myopia, bilateral: Secondary | ICD-10-CM | POA: Diagnosis not present

## 2021-12-21 DIAGNOSIS — Z961 Presence of intraocular lens: Secondary | ICD-10-CM | POA: Diagnosis not present

## 2021-12-24 ENCOUNTER — Other Ambulatory Visit: Payer: Self-pay | Admitting: Physician Assistant

## 2021-12-24 ENCOUNTER — Other Ambulatory Visit: Payer: Self-pay | Admitting: Hematology

## 2021-12-24 DIAGNOSIS — F330 Major depressive disorder, recurrent, mild: Secondary | ICD-10-CM

## 2021-12-24 DIAGNOSIS — L259 Unspecified contact dermatitis, unspecified cause: Secondary | ICD-10-CM

## 2021-12-27 ENCOUNTER — Encounter: Payer: Self-pay | Admitting: Hematology

## 2021-12-28 DIAGNOSIS — H16293 Other keratoconjunctivitis, bilateral: Secondary | ICD-10-CM | POA: Diagnosis not present

## 2021-12-31 ENCOUNTER — Inpatient Hospital Stay (HOSPITAL_BASED_OUTPATIENT_CLINIC_OR_DEPARTMENT_OTHER): Payer: Medicare Other | Admitting: Hematology

## 2021-12-31 ENCOUNTER — Other Ambulatory Visit: Payer: Self-pay

## 2021-12-31 DIAGNOSIS — K521 Toxic gastroenteritis and colitis: Secondary | ICD-10-CM | POA: Diagnosis not present

## 2021-12-31 DIAGNOSIS — C7951 Secondary malignant neoplasm of bone: Secondary | ICD-10-CM | POA: Diagnosis not present

## 2021-12-31 DIAGNOSIS — C155 Malignant neoplasm of lower third of esophagus: Secondary | ICD-10-CM

## 2021-12-31 DIAGNOSIS — C3491 Malignant neoplasm of unspecified part of right bronchus or lung: Secondary | ICD-10-CM | POA: Diagnosis not present

## 2021-12-31 MED ORDER — HYDROCODONE-ACETAMINOPHEN 5-325 MG PO TABS: 1.0000 | ORAL_TABLET | Freq: Four times a day (QID) | ORAL | 0 refills | Status: DC | PRN

## 2022-01-03 ENCOUNTER — Other Ambulatory Visit: Payer: Self-pay

## 2022-01-03 DIAGNOSIS — C155 Malignant neoplasm of lower third of esophagus: Secondary | ICD-10-CM

## 2022-01-04 ENCOUNTER — Encounter: Payer: Self-pay | Admitting: Hematology

## 2022-01-04 MED ORDER — HYDROCODONE-ACETAMINOPHEN 5-325 MG PO TABS: 1.0000 | ORAL_TABLET | Freq: Four times a day (QID) | ORAL | 0 refills | Status: DC | PRN

## 2022-01-06 ENCOUNTER — Encounter: Payer: Self-pay | Admitting: Hematology

## 2022-01-06 NOTE — Progress Notes (Addendum)
HEMATOLOGY ONCOLOGY PHONE VISIT NOTE  Date of service:  .12/31/2021   Patient Care Team: [name], PA-C as PCP - General (Physician Assistant) [name], MD as Consulting Physician (Hematology and Oncology)  CHIEF COMPLAINTS/PURPOSE OF CONSULTATION:  Discussion of CT chest abdomen pelvis  DIAGNOSIS:   #1 Metastatic non-small cell lung cancer with bilateral lung nodules and large metastatic lesion in the left Ilium. 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 Also had c diff colitis - treated   Current Treatment  1) Active surveillance 2) Xgeva 1109m Shell q12weeks for bone metastases. 3) Sandostatin q4weeks for diarrhea   Previous Treatment  For metastatic lung cancer 1 Palliative radiation therapy to the large left ilium metastases 2. IV Nivolumab x 20 cycles (discontinued due to likely immune colitis) 3. Xgeva 1274mSC q4weeks for bone metastases.  For Esophageal adenocarcinoma S/p Concurrent carbo/taxol + RT  HISTORY OF PRESENTING ILLNESS: (plz see my previous consultation for details of initial presentation)  INTERVAL HISTORY:   .I connected with [name] .12/31/2021 at 11:40 AM EST by telephone visit and verified that I am speaking with the correct person using two identifiers.   I discussed the limitations, risks, security and privacy concerns of performing an evaluation and management service by telemedicine and the availability of in-person appointments. I also discussed with the patient that there may be a patient responsible charge related to this service. The patient expressed understanding and agreed to proceed.   Other persons participating in the visit and their role in the encounter: none   Patients location: Home Providers location: [place] Complaint: Discussion of CT results  Patient notes no new symptoms since her last clinic visit.   CT chest abdomen pelvis shows . Stable examination including mild circumferential wall thickening of a patulous lower thoracic esophagus without discrete mass. No evidence of new or progressive disease within the chest, abdomen, or pelvis.    MEDICAL HISTORY:  Past Medical History:  Diagnosis Date   Barrett's esophagus    Bone neoplasm 06/24/2015   [facility]   metast[facility] poorly differentiated carcinoma. tumor left groin surgical removal with radiation tx.   Cataract    BILATERAL   Cigarette smoker two packs a day or less    Currently still smoking 2 PPD - Not interested in quitting at this time.   Colitis 2017   Colon polyps    hyperplastic, tubular adenomas, tubulovillous adenoma   Cough, persistent    hx. lung cancer ? primary-being evaluated, unsure of primary site.   Depression 06/24/2015   Diverticulosis    Emphysema of lung ([facility]   Endometriosis    Hysterectomy with BSO at age [age]rs   [facility]ophageal [facility]   intramucosal   Gastritis    GERD (gastroesophageal reflux disease)    H/O: pneumonia    Hiatal hernia    Hyperlipidemia    Hypertension 06/24/2015   likely improved incidental to 40 lbs weight loss from her neoplasm. No Longer taking med for this as of 08-06-15   IBS (irritable bowel syndrome)    Pain    left hip-persistent"tumor of bone"-radiation tx. 10.   Vitamin D deficiency disease    SURGICAL HISTORY: Past Surgical History:  Procedure Laterality Date   ABDOMINAL HYSTERECTOMY     BALLOON DILATION N/A 10/08/2019   Procedure: BALLOON DILATION;  Surgeon: PyJerene BearsMD;  Location: [facility];  Service: Gastroenterology;  Laterality: N/A;   BARTHOLIN GLAND CYST EXCISION  81 yo ago   Does not want if it was an infected cyst or tumor. Was soon as delivery   BIOPSY  01/02/2019   Procedure: BIOPSY;  Surgeon: [name], MD;  Location: [facility];  Service: Gastroenterology;;   CATARACT EXTRACTION     COLONOSCOPY W/ POLYPECTOMY      multiple times - last done 09/2014 per patient.   ESOPHAGOGASTRODUODENOSCOPY (EGD) WITH PROPOFOL N/A 08/11/2015   Procedure: ESOPHAGOGASTRODUODENOSCOPY (EGD) WITH PROPOFOL;  Surgeon: [name], MD;  Location: [facility];  Service: Gastroenterology;  Laterality: N/A;   ESOPHAGOGASTRODUODENOSCOPY (EGD) WITH PROPOFOL N/A 01/02/2019   Procedure: ESOPHAGOGASTRODUODENOSCOPY (EGD) WITH PROPOFOL;  Surgeon: [name], MD;  Location: [facility];  Service: Gastroenterology;  Laterality: N/A;   ESOPHAGOGASTRODUODENOSCOPY (EGD) WITH PROPOFOL N/A 10/08/2019   Procedure: ESOPHAGOGASTRODUODENOSCOPY (EGD) WITH PROPOFOL;  Surgeon: [name], MD;  Location: [facility];  Service: Gastroenterology;  Laterality: N/A;   FLEXIBLE SIGMOIDOSCOPY N/A 06/24/2017   Procedure: FLEXIBLE SIGMOIDOSCOPY;  Surgeon: Manus Gunning, MD;  Location: [facility];  Service: Gastroenterology;  Laterality: N/A;   GANGLION CYST EXCISION     KNEE ARTHROSCOPY  age about 79 yrs   TONSILLECTOMY     TOTAL ABDOMINAL HYSTERECTOMY W/ BILATERAL SALPINGOOPHORECTOMY  at age 58 yrs   For endometriosis    SOCIAL HISTORY: Social History   Socioeconomic History   Marital status: Widowed    Spouse name: Not on file   Number of children: 2   Years of education: Not on file   Highest education level: Not on file  Occupational History   Not on file  Tobacco Use   Smoking status: Former    Packs/day: 1.00    Years: 60.00    Pack years: 60.00    Types: Cigarettes    Quit date: 12/05/2014    Years since quitting: 7.1   Smokeless tobacco: Never  Vaping Use   Vaping Use: Never used  Substance and Sexual Activity   Alcohol use: No    Alcohol/week: 0.0 standard drinks   Drug use: No   Sexual activity: Not Currently  Other Topics Concern   Not on file  Social History Narrative   Not on file   Social Determinants of Health   Financial Resource Strain: Not on file  Food Insecurity: Not on file  Transportation Needs:  Not on file  Physical Activity: Not on file  Stress: Not on file  Social Connections: Not on file  Intimate Partner Violence: Not on file    FAMILY HISTORY: Family History  Problem Relation Age of Onset   Colon cancer Brother    Colon cancer Brother    Stroke Mother    Colon cancer Father    Emphysema Father        smoked   Breast cancer Daughter 62       ER/PR+ stage II    ALLERGIES:  is allergic to penicillins, remeron [mirtazapine], and latex. patient wonders if she has a penicillin allergy but notes that she is uncertain about this.  MEDICATIONS:  Current Outpatient Medications  Medication Sig Dispense Refill   albuterol (VENTOLIN HFA) 108 (90 Base) MCG/ACT inhaler Inhale 2 puffs into the lungs every 6 (six) hours as needed for wheezing or shortness of breath. 8 g 2   amLODipine (NORVASC) 5 MG tablet TAKE 1 TABLET BY MOUTH EVERY DAY 90 tablet 1  azithromycin (ZITHROMAX) 250 MG tablet 2 tablets [500 mg] on day 1 and then 1 tablet [250 mg] from day 2 to day 5. 6 each 0   Biotin 5 MG CAPS Take 5 mg by mouth daily.     busPIRone (BUSPAR) 10 MG tablet TAKE 1 TABLET BY MOUTH THREE TIMES A DAY 270 tablet 0   Calcium Citrate-Vitamin D (CALCIUM + D PO) Take 1 tablet by mouth daily.     Cyanocobalamin (B-12) 2500 MCG TABS Take 2,500 mcg by mouth daily.     escitalopram (LEXAPRO) 10 MG tablet TAKE 0.5 TABLET BY MOUTH DAILY X 7 DAYS. THEN TAKE 1 TABLET BY MOUTH DAILY. 87 tablet 0   HYDROcodone-acetaminophen (NORCO) 5-325 MG tablet Take 1 tablet by mouth every 6 (six) hours as needed for moderate pain or severe pain. 30 tablet 0   nystatin (MYCOSTATIN) 100000 UNIT/ML suspension Take 5 mLs (500,000 Units total) by mouth 4 (four) times daily. 200 mL 0   omeprazole (PRILOSEC) 40 MG capsule TAKE 1 CAPSULE BY MOUTH EVERY DAY BEFORE BREAKFAST 90 capsule 1   tobramycin-dexamethasone (TOBRADEX) ophthalmic solution SMARTSIG:In Eye(s)     traZODone (DESYREL) 50 MG tablet TAKE 1 TABLET BY MOUTH  EVERYDAY AT BEDTIME 90 tablet 2   triamcinolone cream (KENALOG) 0.1 % APPLY TO AFFECTED AREA TWICE A DAY 30 g 0   XARELTO 20 MG TABS tablet TAKE 1 TABLET BY MOUTH DAILY WITH SUPPER 30 tablet 2   No current facility-administered medications for this visit.    PHYSICAL EXAMINATION: Telemedicine visit  LABORATORY DATA:  I have reviewed the data as listed  . CBC Latest Ref Rng & Units 01/07/2022 12/10/2021 11/26/2021  WBC 4.0 - 10.5 K/uL 11.1(H) 8.5 8.7  Hemoglobin 12.0 - 15.0 g/dL 11.9(L) 12.4 12.4  Hematocrit 36.0 - 46.0 % 38.2 39.2 37.4  Platelets 150 - 400 K/uL 196 237 240   ANC 1.8k . CMP Latest Ref Rng & Units 01/07/2022 12/10/2021 11/26/2021  Glucose 70 - 99 mg/dL 167(H) 108(H) 122(H)  BUN 8 - 23 mg/dL 14 23 20   Creatinine 0.44 - 1.00 mg/dL 1.17(H) 1.32(H) 1.28(H)  Sodium 135 - 145 mmol/L 138 139 140  Potassium 3.5 - 5.1 mmol/L 3.5 4.2 4.5  Chloride 98 - 111 mmol/L 102 108 105  CO2 22 - 32 mmol/L 28 23 22   Calcium 8.9 - 10.3 mg/dL 9.2 9.4 9.4  Total Protein 6.5 - 8.1 g/dL 7.6 8.0 7.3  Total Bilirubin 0.3 - 1.2 mg/dL 0.4 0.3 0.3  Alkaline Phos 38 - 126 U/L 60 56 67  AST 15 - 41 U/L 14(L) 16 21  ALT 0 - 44 U/L 7 9 11         01/02/19 Esophagus Biopsy:    RADIOGRAPHIC STUDIES: .CT CHEST ABDOMEN PELVIS W CONTRAST  Result Date: 12/17/2021 CLINICAL DATA:  Metastatic lung cancer status post immunotherapy and left iliac bone radiation therapy completed 2017. Distal third esophagus cancer status post chemo radiation therapy. Restaging/follow-up. EXAM: CT CHEST, ABDOMEN, AND PELVIS WITH CONTRAST TECHNIQUE: Multidetector CT imaging of the chest, abdomen and pelvis was performed following the standard protocol during bolus administration of intravenous contrast. RADIATION DOSE REDUCTION: This exam was performed according to the departmental dose-optimization program which includes automated exposure control, adjustment of the mA and/or kV according to patient size and/or use of iterative  reconstruction technique. CONTRAST:  69m OMNIPAQUE IOHEXOL 300 MG/ML  SOLN COMPARISON:  Multiple priors including most recent CT May 20, 2021 and February 25, 2021 FINDINGS:  CT CHEST FINDINGS Cardiovascular: Right chest Port-A-Cath with tip at the superior cavoatrial junction. Aortic and branch vessel atherosclerosis without abdominal aortic aneurysm. No central pulmonary embolus on this nondedicated study. Coronary artery calcifications. Normal size heart. No significant pericardial effusion/thickening. Mediastinum/Nodes: No supraclavicular adenopathy. No discrete thyroid nodule. No pathologically enlarged mediastinal, hilar or axillary lymph nodes. Similar patulous appearance of the distal esophagus with mild circumferential wall thickening but no discrete esophageal mass. Lungs/Pleura: Mild diffuse bronchial wall thickening with moderate centrilobular emphysema. Similar biapical pleuroparenchymal scarring. Stable bibasilar and scattered focal areas of pulmonary scarring. No suspicious pulmonary nodules or masses. No pleural effusion. No pneumothorax. Musculoskeletal: No suspicious chest wall mass. No aggressive lytic or blastic lesion of bone. Healed remote anterior right sixth rib fracture and posterior left tenth and eleventh rib fractures. CT ABDOMEN PELVIS FINDINGS Hepatobiliary: Scattered small hepatic cysts measuring up to 1 cm. Additional tiny hypodense hepatic lesions which are too small to accurately characterize but statistically likely to reflect cysts. No solid enhancing hepatic lesion. Cholelithiasis and sludge without findings of acute cholecystitis. No biliary ductal dilation. Pancreas: No pancreatic ductal dilation or evidence of acute inflammation. Spleen: Normal in size without focal abnormality. Adrenals/Urinary Tract: Bilateral adrenal glands are unremarkable. No hydronephrosis. Scattered bilateral subcentimeter hypodense renal lesions are technically too small to accurately characterize but  unchanged and considered benign. Urinary bladder is unremarkable for degree of distension. Stomach/Bowel: Radiopaque enteric contrast material traverses the hepatic flexure. Small hiatal hernia. Stomach is unremarkable for degree of distension. Periampullary duodenal diverticulum. No pathologic dilation of large or small bowel. The terminal ileum and appendix appears normal. Colonic diverticulosis without findings of acute diverticulitis. Vascular/Lymphatic: Aortic and branch vessel atherosclerosis without abdominal aortic aneurysm. The portal, splenic and superior mesenteric veins are patent. No pathologically enlarged abdominal or pelvic lymph nodes. Reproductive: Status post hysterectomy. No adnexal masses. Other: No significant abdominopelvic free fluid. Musculoskeletal: No significant interval change in the previously treated large expansile mixed lytic and sclerotic left iliac bone lesion with chronic endplate completely healed pathologic fracture medially. IMPRESSION: 1. Stable examination including mild circumferential wall thickening of a patulous lower thoracic esophagus without discrete mass. No evidence of new or progressive disease within the chest, abdomen, or pelvis. 2. Cholelithiasis and sludge without findings of acute cholecystitis. 3. Colonic diverticulosis without findings of acute diverticulitis. 4. Aortic Atherosclerosis (ICD10-I70.0) and Emphysema (ICD10-J43.9). Electronically Signed   By: Dahlia Bailiff M.D.   On: 12/17/2021 08:23    ASSESSMENT & PLAN:   81 y.o. female with  #1 Metastatic poorly differentiated carcinoma with likely lung primary non-small cell lung cancer.    CT of the head with and without contrast showed no evidence of metastatic disease. EGFR blood test mutation analysis negative. CT chest abdomen pelvis 04/19/2016 shows no evidence of disease progression. Patient tolerated Nivolumab very well but was discontinued when she developed grade 2 Immune colitis. Has  been off Nivolumab for >6 months  CT chest abdomen pelvis on 06/24/2016 shows no evidence of new disease or progression of metastatic disease. CT chest abdomen pelvis 09/06/2016 shows 1. Mixed interval response to therapy. 2. There is a new left ventral chest wall lesion deep to the pectoralis musculature worrisome for metastatic disease. 3. Posterior lower lobe nodular densities are identified which may reflect areas of pulmonary metastasis. 4. Interval decrease in size of destructive lesion involving the left iliac bone.  CT chest abd pelvis 12/08/2016: Cystic mass involving the left ventral chest wall has resolved in the interval. Likely  was a hematoma due to trauma. Interval increase in size of pleural base mass overlying the posterior and inferior left lower lobe. There is also a new left pleural effusion identified.  CT chest 02/01/2017: Residual irregular soft tissue thickening/volume loss and trace left pleural fluid at the base of the left hemithorax, overall improved in appearance from 12/08/2016. No measurable lesion.  CT chest 05/29/2017 shows no residual pleural based mass or significant pleural effusion in the left hemithorax. No evidence of thoracic metastatic disease. No evidence of progressive metastatic disease within the abdomen or pelvis. Mixed lytic and blastic lesion involving the left iliac bone and associated pathologic fracture are unchanged.   CT CAP 09/14/17 shows no new changes. She does have slight displacement of her fractured left iliac bone. Evidence of stable disease.   CT CAP 01/04/2018- No new or progressive metastatic disease. Stable large left iliac bone metastasis with associated chronic pathologic fracture.   CT chest/abd/pelvis done on 04/26/18 revealed Stable exam.  No new or progressive interval findings.  07/19/18 CT C/A/P revealed Stable exam.  No new or progressive interval findings. Large destructive left iliac lesion is similar to prior. Aortic Atherosclerosis  and Emphysema.    11/06/18 CT C/A/P revealed Similar appearance of large mixed lytic and sclerotic lesion in the left ilium. No new metastatic lesions are otherwise noted elsewhere in the chest, abdomen or pelvis. 2. Interval development of thickening of the distal third of the esophagus. This is nonspecific, and could be related to underlying reflux esophagitis. However, if there is any clinical concern for Barrett's metaplasia or esophageal neoplasia, further evaluation with nonemergent endoscopy could be considered. 3. Aortic atherosclerosis, in addition to left main coronary artery disease. Assessment for potential risk factor modification, dietary therapy or pharmacologic therapy may be warranted, if clinically Indicated. 4. Diffuse bronchial wall thickening with mild to moderate centrilobular and paraseptal emphysema; imaging findings suggestive of underlying COPD. 5. Additional incidental findings, as above.  #2  Adenocarcinoma of the Esophagus  Barrett's esophagus 4cms in the distal esophagus with low and high-grade dysplasia  01/02/19 Surgical pathology revealed adenocarcinoma of the esophagus   01/25/19 PET/CT revealed Distal esophageal primary, without hypermetabolic metastatic disease. 2. Chronic left iliac metastasis, as before. 3. Hypermetabolism within and superficial to the right gluteal musculature is most likely related to trauma and/or injection sites. 4. Aortic atherosclerosis, coronary artery atherosclerosis and emphysema.  S/p concurrent Carboplatin and Taxol weekly with RT of 45 Gy in 25 fractions and 5.4 Gy boost, completed between 02/04/19 and 03/27/19  07/03/2019 PET skull base to thigh revealed "1. Interval response to therapy. Significant reduction in FDG uptake associated with distal esophageal mass. SUV max currently 2.61 versus 16.97 previously. 2. 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 Unchanged 3. Aortic Atherosclerosis (ICD10-I70.0) and Emphysema  (ICD10-J43.9). Coronary artery calcifications."  07/15/2020 CT C/A/P ([id number]) ([id number]) revealed "1. No evidence of new or progressive metastatic disease in the chest, abdomen or pelvis."  #3 diarrhea-  now resolved was previously. 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 She was previously on on Lialda, budesonide,probiotics and lomotil but not currently taking any of these. Plan -Continue Sandostatin every 4 weeks   #4 h/o DVT and PE  -continue on Xarelto - no issues with bleeding   #4 history of COPD management per primary care physician  PLAN: -CT chest abdomen pelvis discussed with  the patient and shows  . Stable examination including mild circumferential wall thickening of a patulous lower thoracic esophagus without discrete mass. No evidence of new or progressive disease within the chest, abdomen, or pelvis  FOLLOW UP: -continue Sandostatin q4weeks plz schedule next 4 doses -Xgeva every 12 weeks with labs - plz schedule next 3 doses. -RTC with Dr Irene Limbo with labs in 3 months  All of the patient's questions were answered with apparent satisfaction. The patient knows to call the clinic with any problems, questions or concerns.   Sullivan Lone MD MS Hematology/Oncology Physician [facility]

## 2022-01-07 ENCOUNTER — Other Ambulatory Visit: Payer: Self-pay

## 2022-01-07 ENCOUNTER — Inpatient Hospital Stay: Payer: Medicare Other

## 2022-01-07 ENCOUNTER — Inpatient Hospital Stay: Payer: Medicare Other | Attending: Hematology

## 2022-01-07 VITALS — BP 115/70 | HR 83 | Temp 98.7°F | Resp 18

## 2022-01-07 DIAGNOSIS — C3490 Malignant neoplasm of unspecified part of unspecified bronchus or lung: Secondary | ICD-10-CM

## 2022-01-07 DIAGNOSIS — Z95828 Presence of other vascular implants and grafts: Secondary | ICD-10-CM

## 2022-01-07 DIAGNOSIS — R197 Diarrhea, unspecified: Secondary | ICD-10-CM

## 2022-01-07 DIAGNOSIS — Z7189 Other specified counseling: Secondary | ICD-10-CM

## 2022-01-07 DIAGNOSIS — K521 Toxic gastroenteritis and colitis: Secondary | ICD-10-CM

## 2022-01-07 DIAGNOSIS — C155 Malignant neoplasm of lower third of esophagus: Secondary | ICD-10-CM | POA: Insufficient documentation

## 2022-01-07 DIAGNOSIS — K529 Noninfective gastroenteritis and colitis, unspecified: Secondary | ICD-10-CM

## 2022-01-07 DIAGNOSIS — Z7951 Long term (current) use of inhaled steroids: Secondary | ICD-10-CM | POA: Diagnosis not present

## 2022-01-07 DIAGNOSIS — C7951 Secondary malignant neoplasm of bone: Secondary | ICD-10-CM | POA: Diagnosis present

## 2022-01-07 LAB — CMP (CANCER CENTER ONLY)
ALT: 7 U/L (ref 0–44)
AST: 14 U/L — ABNORMAL LOW (ref 15–41)
Albumin: 3.6 g/dL (ref 3.5–5.0)
Alkaline Phosphatase: 60 U/L (ref 38–126)
Anion gap: 8 (ref 5–15)
BUN: 14 mg/dL (ref 8–23)
CO2: 28 mmol/L (ref 22–32)
Calcium: 9.2 mg/dL (ref 8.9–10.3)
Chloride: 102 mmol/L (ref 98–111)
Creatinine: 1.17 mg/dL — ABNORMAL HIGH (ref 0.44–1.00)
GFR, Estimated: 47 mL/min — ABNORMAL LOW (ref >60)
Glucose, Bld: 167 mg/dL — ABNORMAL HIGH (ref 70–99)
Potassium: 3.5 mmol/L (ref 3.5–5.1)
Sodium: 138 mmol/L (ref 135–145)
Total Bilirubin: 0.4 mg/dL (ref 0.3–1.2)
Total Protein: 7.6 g/dL (ref 6.5–8.1)

## 2022-01-07 LAB — CBC WITH DIFFERENTIAL (CANCER CENTER ONLY)
Abs Immature Granulocytes: 0.04 10*3/uL (ref 0.00–0.07)
Basophils Absolute: 0.1 10*3/uL (ref 0.0–0.1)
Basophils Relative: 1 %
Eosinophils Absolute: 0.2 10*3/uL (ref 0.0–0.5)
Eosinophils Relative: 2 %
HCT: 38.2 % (ref 36.0–46.0)
Hemoglobin: 11.9 g/dL — ABNORMAL LOW (ref 12.0–15.0)
Immature Granulocytes: 0 %
Lymphocytes Relative: 12 %
Lymphs Abs: 1.3 10*3/uL (ref 0.7–4.0)
MCH: 29 pg (ref 26.0–34.0)
MCHC: 31.2 g/dL (ref 30.0–36.0)
MCV: 92.9 fL (ref 80.0–100.0)
Monocytes Absolute: 0.9 10*3/uL (ref 0.1–1.0)
Monocytes Relative: 8 %
Neutro Abs: 8.7 10*3/uL — ABNORMAL HIGH (ref 1.7–7.7)
Neutrophils Relative %: 77 %
Platelet Count: 196 10*3/uL (ref 150–400)
RBC: 4.11 MIL/uL (ref 3.87–5.11)
RDW: 14.4 % (ref 11.5–15.5)
WBC Count: 11.1 10*3/uL — ABNORMAL HIGH (ref 4.0–10.5)
nRBC: 0 % (ref 0.0–0.2)

## 2022-01-07 MED ORDER — SODIUM CHLORIDE 0.9% FLUSH
10.0000 mL | Freq: Once | INTRAVENOUS | Status: AC
  Administered 2022-01-07: 10 mL

## 2022-01-07 MED ORDER — OCTREOTIDE ACETATE 30 MG IM KIT
30.0000 mg | PACK | Freq: Once | INTRAMUSCULAR | Status: AC
  Administered 2022-01-07: 30 mg via INTRAMUSCULAR
  Filled 2022-01-07: qty 1

## 2022-01-07 MED ORDER — HEPARIN SOD (PORK) LOCK FLUSH 100 UNIT/ML IV SOLN
500.0000 [IU] | Freq: Once | INTRAVENOUS | Status: AC
  Administered 2022-01-07: 500 [IU]

## 2022-01-13 ENCOUNTER — Other Ambulatory Visit: Payer: Self-pay

## 2022-01-13 ENCOUNTER — Encounter: Payer: Self-pay | Admitting: Nurse Practitioner

## 2022-01-13 ENCOUNTER — Ambulatory Visit (INDEPENDENT_AMBULATORY_CARE_PROVIDER_SITE_OTHER): Payer: Medicare Other | Admitting: Nurse Practitioner

## 2022-01-13 VITALS — BP 128/64 | HR 105 | Temp 97.4°F | Ht 69.0 in | Wt 152.4 lb

## 2022-01-13 DIAGNOSIS — R062 Wheezing: Secondary | ICD-10-CM | POA: Diagnosis not present

## 2022-01-13 DIAGNOSIS — J019 Acute sinusitis, unspecified: Secondary | ICD-10-CM | POA: Diagnosis not present

## 2022-01-13 DIAGNOSIS — R052 Subacute cough: Secondary | ICD-10-CM | POA: Diagnosis not present

## 2022-01-13 LAB — POCT INFLUENZA A/B
Influenza A, POC: NEGATIVE
Influenza B, POC: NEGATIVE

## 2022-01-13 MED ORDER — PREDNISONE 10 MG (21) PO TBPK: ORAL_TABLET | ORAL | 0 refills | Status: DC

## 2022-01-13 MED ORDER — BENZONATATE 200 MG PO CAPS: 200.0000 mg | ORAL_CAPSULE | Freq: Two times a day (BID) | ORAL | 0 refills | Status: DC | PRN

## 2022-01-13 MED ORDER — CLARITHROMYCIN 500 MG PO TABS: 500.0000 mg | ORAL_TABLET | Freq: Two times a day (BID) | ORAL | 0 refills | Status: DC

## 2022-01-13 NOTE — Progress Notes (Signed)
Established patient visit   Patient: Cindy Byrd   DOB: Oct 04, 1941   81 y.o. Female  MRN: [id number] Visit Date: 01/13/2022  Chief Complaint  Patient presents with   Cough   Subjective    The patient states that she has taken two home tests for COVID 19 which were both negative   Cough This is a new problem. The current episode started in the past 7 days. The problem has been gradually worsening. The cough is Productive of purulent sputum. Associated symptoms include a fever, headaches, myalgias, postnasal drip, rhinorrhea, a sore throat, shortness of breath and sweats. Pertinent negatives include no chest pain, chills, rash or wheezing. Exacerbated by: exertion. She has tried OTC cough suppressant and steroid inhaler for the symptoms. Her past medical history is significant for COPD. history of lung cancer     Medications: Outpatient Medications Prior to Visit  Medication Sig   albuterol (VENTOLIN HFA) 108 (90 Base) MCG/ACT inhaler Inhale 2 puffs into the lungs every 6 (six) hours as needed for wheezing or shortness of breath.   amLODipine (NORVASC) 5 MG tablet TAKE 1 TABLET BY MOUTH EVERY DAY   Biotin 5 MG CAPS Take 5 mg by mouth daily.   busPIRone (BUSPAR) 10 MG tablet TAKE 1 TABLET BY MOUTH THREE TIMES A DAY   Calcium Citrate-Vitamin D (CALCIUM + D PO) Take 1 tablet by mouth daily.   Cyanocobalamin (B-12) 2500 MCG TABS Take 2,500 mcg by mouth daily.   escitalopram (LEXAPRO) 10 MG tablet TAKE 0.5 TABLET BY MOUTH DAILY X 7 DAYS. THEN TAKE 1 TABLET BY MOUTH DAILY.   HYDROcodone-acetaminophen (NORCO) 5-325 MG tablet Take 1 tablet by mouth every 6 (six) hours as needed for moderate pain or severe pain.   nystatin (MYCOSTATIN) 100000 UNIT/ML suspension Take 5 mLs (500,000 Units total) by mouth 4 (four) times daily.   omeprazole (PRILOSEC) 40 MG capsule TAKE 1 CAPSULE BY MOUTH EVERY DAY BEFORE BREAKFAST   tobramycin-dexamethasone (TOBRADEX) ophthalmic solution SMARTSIG:In Eye(s)    traZODone (DESYREL) 50 MG tablet TAKE 1 TABLET BY MOUTH EVERYDAY AT BEDTIME   triamcinolone cream (KENALOG) 0.1 % APPLY TO AFFECTED AREA TWICE A DAY   XARELTO 20 MG TABS tablet TAKE 1 TABLET BY MOUTH DAILY WITH SUPPER   [DISCONTINUED] azithromycin (ZITHROMAX) 250 MG tablet 2 tablets [500 mg] on day 1 and then 1 tablet [250 mg] from day 2 to day 5.   No facility-administered medications prior to visit.    Review of Systems  Constitutional:  Positive for fatigue and fever. Negative for activity change, appetite change and chills.  HENT:  Positive for congestion, postnasal drip, rhinorrhea, sinus pressure, sinus pain and sore throat. Negative for sneezing.   Eyes: Negative.   Respiratory:  Positive for cough and shortness of breath. Negative for chest tightness and wheezing.   Cardiovascular:  Negative for chest pain and palpitations.  Gastrointestinal:  Negative for abdominal pain, constipation, diarrhea, nausea and vomiting.  Endocrine: Negative for cold intolerance, heat intolerance, polydipsia and polyuria.  Genitourinary:  Negative for dyspareunia, dysuria, flank pain, frequency and urgency.  Musculoskeletal:  Positive for myalgias. Negative for arthralgias and back pain.  Skin:  Negative for rash.  Neurological:  Positive for headaches. Negative for dizziness and weakness.  Hematological:  Negative for adenopathy.  Psychiatric/Behavioral:  The patient is not nervous/anxious.     Objective     Today's Vitals   01/13/22 1315  BP: 128/64  Pulse: (!) 105  Temp: (!) 97.4 F (  36.3 C)  SpO2: 94%  Weight: 152 lb 6.4 oz (69.1 kg)  Height: 5\' 9"  (1.753 m)   Body mass index is 22.51 kg/m.   Physical Exam Vitals and nursing note reviewed.  Constitutional:      Appearance: Normal appearance. She is well-developed. She is ill-appearing.  HENT:     Head: Normocephalic and atraumatic.     Right Ear: Hearing, tympanic membrane, ear canal and external ear normal.     Left Ear: Hearing,  tympanic membrane, ear canal and external ear normal.     Nose: Congestion present.     Right Sinus: Maxillary sinus tenderness and frontal sinus tenderness present.     Left Sinus: Maxillary sinus tenderness and frontal sinus tenderness present.     Mouth/Throat:     Pharynx: Posterior oropharyngeal erythema present.  Eyes:     Pupils: Pupils are equal, round, and reactive to light.  Cardiovascular:     Rate and Rhythm: Normal rate and regular rhythm.     Pulses: Normal pulses.     Heart sounds: Normal heart sounds.  Pulmonary:     Effort: Pulmonary effort is normal.     Breath sounds: Wheezing present.     Comments: Congested, non-productive cough noted  Abdominal:     Palpations: Abdomen is soft.  Musculoskeletal:        General: Normal range of motion.     Cervical back: Normal range of motion and neck supple.  Lymphadenopathy:     Cervical: Cervical adenopathy present.  Skin:    General: Skin is warm and dry.     Capillary Refill: Capillary refill takes less than 2 seconds.  Neurological:     General: No focal deficit present.     Mental Status: She is alert and oriented to person, place, and time.  Psychiatric:        Mood and Affect: Mood normal.        Behavior: Behavior normal.        Thought Content: Thought content normal.        Judgment: Judgment normal.    Results for orders placed or performed in visit on 01/13/22  POCT Influenza A/B  Result Value Ref Range   Influenza A, POC Negative Negative   Influenza B, POC Negative Negative    Assessment & Plan     1. Acute non-recurrent sinusitis, unspecified location Testing for influenza is negative today.  Start Biaxin 500 mg tablets.  Take twice daily for next 7 days. Rest and increase fluids. Continue using OTC medication to control symptoms.   - clarithromycin (BIAXIN) 500 MG tablet; Take 1 tablet (500 mg total) by mouth 2 (two) times daily.  Dispense: 14 tablet; Refill: 0 - POCT Influenza A/B   2. Wheezing  on auscultation Start prednisone taper.  Take as directed for 6 days.  Use rescue inhaler as needed and as prescribed. - predniSONE (STERAPRED UNI-PAK 21 TAB) 10 MG (21) TBPK tablet; 6 day taper - take by mouth as directed for 6 days  Dispense: 21 tablet; Refill: 0  3. Subacute cough Tessalon Perles 200 mg may be taken up to twice daily as needed for cough.  Added prednisone taper.  Take as directed for 6 days.  Use rescue inhaler as needed for wheezing and/or cough. - predniSONE (STERAPRED UNI-PAK 21 TAB) 10 MG (21) TBPK tablet; 6 day taper - take by mouth as directed for 6 days  Dispense: 21 tablet; Refill: 0 - benzonatate (TESSALON) 200  MG capsule; Take 1 capsule (200 mg total) by mouth 2 (two) times daily as needed for cough.  Dispense: 20 capsule; Refill: 0   Return for prn worsening or persistent symptoms, as scheduled.        Ronnell Freshwater, NP  [facility] at [facility] [phone] (phone) [phone] (fax)  [place]  This note was dictated using Dragon Voice Recognition Software. Rapid proofreading was performed to expedite the delivery of the information. Despite proofreading, phonetic errors will occur which are common with this voice recognition software. Please take this into consideration. If there are any concerns, please contact our office.

## 2022-01-21 ENCOUNTER — Other Ambulatory Visit: Payer: Self-pay | Admitting: Physician Assistant

## 2022-01-21 DIAGNOSIS — F330 Major depressive disorder, recurrent, mild: Secondary | ICD-10-CM

## 2022-01-21 DIAGNOSIS — L259 Unspecified contact dermatitis, unspecified cause: Secondary | ICD-10-CM

## 2022-01-22 DIAGNOSIS — R062 Wheezing: Secondary | ICD-10-CM | POA: Insufficient documentation

## 2022-01-24 ENCOUNTER — Ambulatory Visit (INDEPENDENT_AMBULATORY_CARE_PROVIDER_SITE_OTHER): Payer: Medicare Other | Admitting: Physician Assistant

## 2022-01-24 ENCOUNTER — Other Ambulatory Visit: Payer: Self-pay

## 2022-01-24 ENCOUNTER — Encounter: Payer: Self-pay | Admitting: Physician Assistant

## 2022-01-24 VITALS — BP 101/62 | HR 88 | Temp 97.6°F | Ht 68.5 in | Wt 153.0 lb

## 2022-01-24 DIAGNOSIS — F411 Generalized anxiety disorder: Secondary | ICD-10-CM | POA: Diagnosis not present

## 2022-01-24 DIAGNOSIS — F330 Major depressive disorder, recurrent, mild: Secondary | ICD-10-CM | POA: Diagnosis not present

## 2022-01-24 DIAGNOSIS — J432 Centrilobular emphysema: Secondary | ICD-10-CM

## 2022-01-24 MED ORDER — ESCITALOPRAM OXALATE 20 MG PO TABS: 20.0000 mg | ORAL_TABLET | Freq: Every day | ORAL | 0 refills | Status: DC

## 2022-01-24 MED ORDER — TRELEGY ELLIPTA 100-62.5-25 MCG/ACT IN AEPB: 1.0000 | INHALATION_SPRAY | Freq: Every day | RESPIRATORY_TRACT | 6 refills | Status: DC

## 2022-01-24 NOTE — Progress Notes (Signed)
Established patient visit   Patient: Cindy Byrd   DOB: 04-Nov-1941   81 y.o. Female  MRN: [id number] Visit Date: 01/24/2022  Chief Complaint  Patient presents with   Follow-up   Subjective    HPI  Patient presents for follow-up on mood. Patient was changed from Prozac to Lexapro. Reports tolerating Lexapro without issues. Has  noticed improvement with mood. States does not feel as depressed. Also reports having more shortness of breath with activity. Albuterol inhaler does not help much when she uses it. No chest pain, palpitations, dizziness or syncope.  Depression screen [facility] 2/9 01/24/2022 11/26/2021 06/22/2021 03/17/2021 02/23/2021  Decreased Interest 1 1 1  0 1  Down, Depressed, Hopeless 1 1 0 0 1  PHQ - 2 Score 2 2 1  0 2  Altered sleeping 3 3 1 1 1   Tired, decreased energy 3 3 3 3 1   Change in appetite 0 1 0 1 0  Feeling bad or failure about yourself  1 1 0 0 0  Trouble concentrating 1 1 1  0 1  Moving slowly or fidgety/restless 0 0 0 0 1  Suicidal thoughts 0 0 0 0 0  PHQ-9 Score 10 11 6 5 6   Difficult doing work/chores Not difficult at all Not difficult at all - - -  Some recent data might be hidden   GAD 7 : Generalized Anxiety Score 01/24/2022 11/26/2021 06/22/2021 12/03/2018  Nervous, Anxious, on Edge 1 0 3 2  Control/stop worrying 1 1 2  0  Worry too much - different things 1 1 2  0  Trouble relaxing 1 0 1 0  Restless 1 0 0 0  Easily annoyed or irritable 1 1 0 0  Afraid - awful might happen 1 3 0 3  Total GAD 7 Score 7 6 8 5   Anxiety Difficulty Somewhat difficult Not difficult at all - Not difficult at all        Medications: Outpatient Medications Prior to Visit  Medication Sig   albuterol (VENTOLIN HFA) 108 (90 Base) MCG/ACT inhaler Inhale 2 puffs into the lungs every 6 (six) hours as needed for wheezing or shortness of breath.   Biotin 5 MG CAPS Take 5 mg by mouth daily.   busPIRone (BUSPAR) 10 MG tablet TAKE 1 TABLET BY MOUTH THREE TIMES A DAY   Calcium  Citrate-Vitamin D (CALCIUM + D PO) Take 1 tablet by mouth daily.   Cyanocobalamin (B-12) 2500 MCG TABS Take 2,500 mcg by mouth daily.   HYDROcodone-acetaminophen (NORCO) 5-325 MG tablet Take 1 tablet by mouth every 6 (six) hours as needed for moderate pain or severe pain.   nystatin (MYCOSTATIN) 100000 UNIT/ML suspension Take 5 mLs (500,000 Units total) by mouth 4 (four) times daily.   omeprazole (PRILOSEC) 40 MG capsule TAKE 1 CAPSULE BY MOUTH EVERY DAY BEFORE BREAKFAST   traZODone (DESYREL) 50 MG tablet TAKE 1 TABLET BY MOUTH EVERYDAY AT BEDTIME   triamcinolone cream (KENALOG) 0.1 % APPLY TO AFFECTED AREA TWICE A DAY   XARELTO 20 MG TABS tablet TAKE 1 TABLET BY MOUTH DAILY WITH SUPPER   [DISCONTINUED] amLODipine (NORVASC) 5 MG tablet TAKE 1 TABLET BY MOUTH EVERY DAY   [DISCONTINUED] benzonatate (TESSALON) 200 MG capsule Take 1 capsule (200 mg total) by mouth 2 (two) times daily as needed for cough.   [DISCONTINUED] escitalopram (LEXAPRO) 10 MG tablet TAKE 0.5 TABLET BY MOUTH DAILY X 7 DAYS. THEN TAKE 1 TABLET BY MOUTH DAILY.   [DISCONTINUED] FLUoxetine (PROZAC) 40 MG capsule Take  40 mg by mouth daily.   [DISCONTINUED] predniSONE (STERAPRED UNI-PAK 21 TAB) 10 MG (21) TBPK tablet 6 day taper - take by mouth as directed for 6 days   [DISCONTINUED] tobramycin-dexamethasone (TOBRADEX) ophthalmic solution SMARTSIG:In Eye(s)   No facility-administered medications prior to visit.    Review of Systems Review of Systems:  A fourteen system review of systems was performed and found to be positive as per HPI.     Objective    BP 101/62    Pulse 88    Temp 97.6 F (36.4 C)    Ht 5' 8.5" (1.74 m)    Wt 153 lb (69.4 kg)    SpO2 96%    BMI 22.93 kg/m  BP Readings from Last 3 Encounters:  01/24/22 101/62  01/13/22 128/64  01/07/22 115/70   Wt Readings from Last 3 Encounters:  01/24/22 153 lb (69.4 kg)  01/13/22 152 lb 6.4 oz (69.1 kg)  12/10/21 152 lb 9.6 oz (69.2 kg)    Physical Exam   General:  Well Developed, well nourished, appropriate for stated age.  Neuro:  Alert and oriented,  extra-ocular muscles intact  HEENT:  Normocephalic, atraumatic, neck supple  Skin:  no gross rash, warm, pink. Cardiac:  RRR, S1 S2 Respiratory: CTA B/L  Vascular:  Ext warm, no cyanosis apprec.; cap RF less 2 sec. Psych:  No HI/SI, judgement and insight good, Euthymic mood. Full Affect.   No results found for any visits on 01/24/22.  Assessment & Plan      Problem List Items Addressed This Visit       Other   Depression - Primary   Relevant Medications   escitalopram (LEXAPRO) 20 MG tablet   GAD (generalized anxiety disorder)   Relevant Medications   escitalopram (LEXAPRO) 20 MG tablet   Other Visit Diagnoses     Centrilobular emphysema (HCC)       Relevant Medications   Fluticasone-Umeclidin-Vilant (TRELEGY ELLIPTA) 100-62.5-25 MCG/ACT AEPB      Depression, GAD: -Mild improvement and patient tolerating Lexapro 10 mg without issues so will increase to 20 mg to maximize efficacy. Advised to let me know if unable to tolerate higher dose and will resume 10 mg. Pt verbalized understanding. Will continue to monitor and reassess medication therapy in 3 months.  Centrilobular emphysema: -Reviewed CT chest abdomen pelvis w/ contrast 12/17/2021: Lungs/Pleura: Mild diffuse bronchial wall thickening with moderate centrilobular emphysema. Similar biapical pleuroparenchymal scarring. Stable bibasilar and scattered focal areas of pulmonary scarring. No suspicious pulmonary nodules or masses. No pleural effusion. No pneumothorax. -Will start patient on Trelegy. Recommend to continue rescue inhaler as needed. Will continue to monitor and reassess medication therapy in 3 months.    Return in about 3 months (around 04/23/2022) for Mood- inc dose, HTN, HLD .        [name], PA-C  [facility] at [facility] [phone] (phone) [phone] (fax)  [facility]

## 2022-01-24 NOTE — Patient Instructions (Signed)
Managing Depression, Adult Depression is a mental health condition that affects your thoughts, feelings, and actions. Being diagnosed with depression can bring you relief if you did not know why you have felt or behaved a certain way. It could also leave you feeling overwhelmed with uncertainty about your future. Preparing yourself to manage your symptoms can help you feel more positive about your future. How to manage lifestyle changes Managing stress Stress is your body's reaction to life changes and events, both good and bad. Stress can add to your feelings of depression. Learning to manage your stress can help lessen your feelings of depression. Try some of the following approaches to reducing your stress (stress reduction techniques): Listen to music that you enjoy and that inspires you. Try using a meditation app or take a meditation class. Develop a practice that helps you connect with your spiritual self. Walk in nature, pray, or go to a place of worship. Do some deep breathing. To do this, inhale slowly through your nose. Pause at the top of your inhale for a few seconds and then exhale slowly, letting your muscles relax. Practice yoga to help relax and work your muscles. Choose a stress reduction technique that suits your lifestyle and personality. These techniques take time and practice to develop. Set aside 5-15 minutes a day to do them. Therapists can offer training in these techniques. Other things you can do to manage stress include: Keeping a stress diary. Knowing your limits and saying no when you think something is too much. Paying attention to how you react to certain situations. You may not be able to control everything, but you can change your reaction. Adding humor to your life by watching funny films or TV shows. Making time for activities that you enjoy and that relax you.  Medicines Medicines, such as antidepressants, are often a part of treatment for depression. Talk  with your pharmacist or health care provider about all the medicines, supplements, and herbal products that you take, their possible side effects, and what medicines and other products are safe to take together. Make sure to report any side effects you may have to your health care provider. Relationships Your health care provider may suggest family therapy, couples therapy, or individual therapy as part of your treatment. How to recognize changes Everyone responds differently to treatment for depression. As you recover from depression, you may start to: Have more interest in doing activities. Feel less hopeless. Have more energy. Overeat less often, or have a better appetite. Have better mental focus. It is important to recognize if your depression is not getting better or is getting worse. The symptoms you had in the beginning may return, such as: Tiredness (fatigue) or low energy. Eating too much or too little. Sleeping too much or too little. Feeling restless, agitated, or hopeless. Trouble focusing or making decisions. Unexplained physical complaints. Feeling irritable, angry, or aggressive. If you or your family members notice these symptoms coming back, let your health care provider know right away. Follow these instructions at home: Activity  Try to get some form of exercise each day, such as walking, biking, swimming, or lifting weights. Practice stress reduction techniques. Engage your mind by taking a class or doing some volunteer work. Lifestyle Get the right amount and quality of sleep. Cut down on using caffeine, tobacco, alcohol, and other potentially harmful substances. Eat a healthy diet that includes plenty of vegetables, fruits, whole grains, low-fat dairy products, and lean protein. Do not eat a lot   of foods that are high in solid fats, added sugars, or salt (sodium). General instructions Take over-the-counter and prescription medicines only as told by your health  care provider. Keep all follow-up visits as told by your health care provider. This is important. Where to find support Talking to others Friends and family members can be sources of support and guidance. Talk to trusted friends or family members about your condition. Explain your symptoms to them, and let them know that you are working with a health care provider to treat your depression. Tell friends and family members how they also can be helpful. Finances Find appropriate mental health providers that fit with your financial situation. Talk with your health care provider about options to get reduced prices on your medicines. Where to find more information You can find support in your area from: Anxiety and Depression Association of America (ADAA): www.adaa.org Mental Health America: www.mentalhealthamerica.net National Alliance on Mental Illness: www.nami.org Contact a health care provider if: You stop taking your antidepressant medicines, and you have any of these symptoms: Nausea. Headache. Light-headedness. Chills and body aches. Not being able to sleep (insomnia). You or your friends and family think your depression is getting worse. Get help right away if: You have thoughts of hurting yourself or others. If you ever feel like you may hurt yourself or others, or have thoughts about taking your own life, get help right away. Go to your nearest emergency department or: Call your local emergency services (911 in the [place] Call a suicide crisis helpline, such as the National Suicide Prevention Lifeline at 1-800-273-8255 or 988 in the [place] This is open 24 hours a day in the [place] Text the Crisis Text Line at 741741 (in the [place] Summary If you are diagnosed with depression, preparing yourself to manage your symptoms is a good way to feel positive about your future. Work with your health care provider on a management plan that includes stress reduction techniques, medicines (if  applicable), therapy, and healthy lifestyle habits. Keep talking with your health care provider about how your treatment is working. If you have thoughts about taking your own life, call a suicide crisis helpline or text a crisis text line. This information is not intended to replace advice given to you by your health care provider. Make sure you discuss any questions you have with your health care provider. Document Revised: 06/16/2021 Document Reviewed: 10/02/2019 Elsevier Patient Education  2022 [employer]  

## 2022-02-04 ENCOUNTER — Inpatient Hospital Stay: Payer: Medicare Other | Attending: Hematology

## 2022-02-04 ENCOUNTER — Inpatient Hospital Stay: Payer: Medicare Other

## 2022-02-04 ENCOUNTER — Other Ambulatory Visit: Payer: Self-pay

## 2022-02-04 VITALS — BP 144/67 | HR 86 | Temp 98.2°F | Resp 17

## 2022-02-04 DIAGNOSIS — C155 Malignant neoplasm of lower third of esophagus: Secondary | ICD-10-CM | POA: Diagnosis not present

## 2022-02-04 DIAGNOSIS — C3490 Malignant neoplasm of unspecified part of unspecified bronchus or lung: Secondary | ICD-10-CM

## 2022-02-04 DIAGNOSIS — K529 Noninfective gastroenteritis and colitis, unspecified: Secondary | ICD-10-CM

## 2022-02-04 DIAGNOSIS — C3491 Malignant neoplasm of unspecified part of right bronchus or lung: Secondary | ICD-10-CM

## 2022-02-04 DIAGNOSIS — C7951 Secondary malignant neoplasm of bone: Secondary | ICD-10-CM | POA: Insufficient documentation

## 2022-02-04 DIAGNOSIS — K521 Toxic gastroenteritis and colitis: Secondary | ICD-10-CM | POA: Diagnosis not present

## 2022-02-04 DIAGNOSIS — Z95828 Presence of other vascular implants and grafts: Secondary | ICD-10-CM

## 2022-02-04 DIAGNOSIS — Z7189 Other specified counseling: Secondary | ICD-10-CM

## 2022-02-04 DIAGNOSIS — R197 Diarrhea, unspecified: Secondary | ICD-10-CM

## 2022-02-04 LAB — CMP (CANCER CENTER ONLY)
ALT: 9 U/L (ref 0–44)
AST: 17 U/L (ref 15–41)
Albumin: 3.5 g/dL (ref 3.5–5.0)
Alkaline Phosphatase: 65 U/L (ref 38–126)
Anion gap: 8 (ref 5–15)
BUN: 16 mg/dL (ref 8–23)
CO2: 27 mmol/L (ref 22–32)
Calcium: 9.2 mg/dL (ref 8.9–10.3)
Chloride: 103 mmol/L (ref 98–111)
Creatinine: 1.27 mg/dL — ABNORMAL HIGH (ref 0.44–1.00)
GFR, Estimated: 42 mL/min — ABNORMAL LOW (ref >60)
Glucose, Bld: 113 mg/dL — ABNORMAL HIGH (ref 70–99)
Potassium: 4.4 mmol/L (ref 3.5–5.1)
Sodium: 138 mmol/L (ref 135–145)
Total Bilirubin: 0.3 mg/dL (ref 0.3–1.2)
Total Protein: 7.6 g/dL (ref 6.5–8.1)

## 2022-02-04 LAB — CBC WITH DIFFERENTIAL (CANCER CENTER ONLY)
Abs Immature Granulocytes: 0.02 10*3/uL (ref 0.00–0.07)
Basophils Absolute: 0.1 10*3/uL (ref 0.0–0.1)
Basophils Relative: 1 %
Eosinophils Absolute: 0.2 10*3/uL (ref 0.0–0.5)
Eosinophils Relative: 3 %
HCT: 40.2 % (ref 36.0–46.0)
Hemoglobin: 12.6 g/dL (ref 12.0–15.0)
Immature Granulocytes: 0 %
Lymphocytes Relative: 22 %
Lymphs Abs: 1.6 10*3/uL (ref 0.7–4.0)
MCH: 29 pg (ref 26.0–34.0)
MCHC: 31.3 g/dL (ref 30.0–36.0)
MCV: 92.6 fL (ref 80.0–100.0)
Monocytes Absolute: 0.6 10*3/uL (ref 0.1–1.0)
Monocytes Relative: 8 %
Neutro Abs: 4.9 10*3/uL (ref 1.7–7.7)
Neutrophils Relative %: 66 %
Platelet Count: 207 10*3/uL (ref 150–400)
RBC: 4.34 MIL/uL (ref 3.87–5.11)
RDW: 14.8 % (ref 11.5–15.5)
WBC Count: 7.3 10*3/uL (ref 4.0–10.5)
nRBC: 0 % (ref 0.0–0.2)

## 2022-02-04 MED ORDER — DENOSUMAB 120 MG/1.7ML ~~LOC~~ SOLN
120.0000 mg | Freq: Once | SUBCUTANEOUS | Status: AC
  Administered 2022-02-04: 120 mg via SUBCUTANEOUS
  Filled 2022-02-04: qty 1.7

## 2022-02-04 MED ORDER — OCTREOTIDE ACETATE 30 MG IM KIT
30.0000 mg | PACK | Freq: Once | INTRAMUSCULAR | Status: AC
  Administered 2022-02-04: 30 mg via INTRAMUSCULAR
  Filled 2022-02-04: qty 1

## 2022-02-04 MED ORDER — HEPARIN SOD (PORK) LOCK FLUSH 100 UNIT/ML IV SOLN
500.0000 [IU] | Freq: Once | INTRAVENOUS | Status: AC
  Administered 2022-02-04: 500 [IU] via INTRAVENOUS

## 2022-02-04 MED ORDER — SODIUM CHLORIDE 0.9% FLUSH
10.0000 mL | INTRAVENOUS | Status: DC | PRN
  Administered 2022-02-04: 10 mL via INTRAVENOUS

## 2022-02-26 ENCOUNTER — Other Ambulatory Visit: Payer: Self-pay | Admitting: Physician Assistant

## 2022-02-26 ENCOUNTER — Other Ambulatory Visit: Payer: Self-pay | Admitting: Hematology

## 2022-02-26 DIAGNOSIS — F330 Major depressive disorder, recurrent, mild: Secondary | ICD-10-CM

## 2022-02-26 DIAGNOSIS — C3490 Malignant neoplasm of unspecified part of unspecified bronchus or lung: Secondary | ICD-10-CM

## 2022-02-26 DIAGNOSIS — F411 Generalized anxiety disorder: Secondary | ICD-10-CM

## 2022-02-28 ENCOUNTER — Encounter: Payer: Self-pay | Admitting: Hematology

## 2022-03-01 ENCOUNTER — Other Ambulatory Visit: Payer: Self-pay | Admitting: Hematology

## 2022-03-01 DIAGNOSIS — C3490 Malignant neoplasm of unspecified part of unspecified bronchus or lung: Secondary | ICD-10-CM

## 2022-03-02 ENCOUNTER — Encounter: Payer: Self-pay | Admitting: Hematology

## 2022-03-04 ENCOUNTER — Other Ambulatory Visit: Payer: Medicare Other

## 2022-03-04 ENCOUNTER — Other Ambulatory Visit: Payer: Self-pay

## 2022-03-04 ENCOUNTER — Inpatient Hospital Stay: Payer: Medicare Other

## 2022-03-04 ENCOUNTER — Ambulatory Visit: Payer: Medicare Other

## 2022-03-04 VITALS — BP 128/62 | HR 78 | Temp 98.3°F | Resp 17

## 2022-03-04 DIAGNOSIS — C7951 Secondary malignant neoplasm of bone: Secondary | ICD-10-CM | POA: Diagnosis not present

## 2022-03-04 DIAGNOSIS — Z7189 Other specified counseling: Secondary | ICD-10-CM

## 2022-03-04 DIAGNOSIS — K521 Toxic gastroenteritis and colitis: Secondary | ICD-10-CM | POA: Diagnosis not present

## 2022-03-04 DIAGNOSIS — R197 Diarrhea, unspecified: Secondary | ICD-10-CM

## 2022-03-04 DIAGNOSIS — K529 Noninfective gastroenteritis and colitis, unspecified: Secondary | ICD-10-CM

## 2022-03-04 DIAGNOSIS — C155 Malignant neoplasm of lower third of esophagus: Secondary | ICD-10-CM

## 2022-03-04 DIAGNOSIS — C3490 Malignant neoplasm of unspecified part of unspecified bronchus or lung: Secondary | ICD-10-CM

## 2022-03-04 DIAGNOSIS — Z95828 Presence of other vascular implants and grafts: Secondary | ICD-10-CM

## 2022-03-04 LAB — CBC WITH DIFFERENTIAL (CANCER CENTER ONLY)
Abs Immature Granulocytes: 0.02 10*3/uL (ref 0.00–0.07)
Basophils Absolute: 0.1 10*3/uL (ref 0.0–0.1)
Basophils Relative: 1 %
Eosinophils Absolute: 0.2 10*3/uL (ref 0.0–0.5)
Eosinophils Relative: 3 %
HCT: 40.1 % (ref 36.0–46.0)
Hemoglobin: 12.8 g/dL (ref 12.0–15.0)
Immature Granulocytes: 0 %
Lymphocytes Relative: 20 %
Lymphs Abs: 1.4 10*3/uL (ref 0.7–4.0)
MCH: 29.7 pg (ref 26.0–34.0)
MCHC: 31.9 g/dL (ref 30.0–36.0)
MCV: 93 fL (ref 80.0–100.0)
Monocytes Absolute: 0.6 10*3/uL (ref 0.1–1.0)
Monocytes Relative: 8 %
Neutro Abs: 4.8 10*3/uL (ref 1.7–7.7)
Neutrophils Relative %: 68 %
Platelet Count: 222 10*3/uL (ref 150–400)
RBC: 4.31 MIL/uL (ref 3.87–5.11)
RDW: 14.6 % (ref 11.5–15.5)
WBC Count: 7.1 10*3/uL (ref 4.0–10.5)
nRBC: 0 % (ref 0.0–0.2)

## 2022-03-04 LAB — CMP (CANCER CENTER ONLY)
ALT: 8 U/L (ref 0–44)
AST: 17 U/L (ref 15–41)
Albumin: 3.5 g/dL (ref 3.5–5.0)
Alkaline Phosphatase: 55 U/L (ref 38–126)
Anion gap: 7 (ref 5–15)
BUN: 16 mg/dL (ref 8–23)
CO2: 26 mmol/L (ref 22–32)
Calcium: 8.9 mg/dL (ref 8.9–10.3)
Chloride: 106 mmol/L (ref 98–111)
Creatinine: 1.17 mg/dL — ABNORMAL HIGH (ref 0.44–1.00)
GFR, Estimated: 47 mL/min — ABNORMAL LOW (ref >60)
Glucose, Bld: 129 mg/dL — ABNORMAL HIGH (ref 70–99)
Potassium: 4 mmol/L (ref 3.5–5.1)
Sodium: 139 mmol/L (ref 135–145)
Total Bilirubin: 0.3 mg/dL (ref 0.3–1.2)
Total Protein: 7.4 g/dL (ref 6.5–8.1)

## 2022-03-04 MED ORDER — OCTREOTIDE ACETATE 30 MG IM KIT
30.0000 mg | PACK | Freq: Once | INTRAMUSCULAR | Status: AC
  Administered 2022-03-04: 30 mg via INTRAMUSCULAR
  Filled 2022-03-04: qty 1

## 2022-03-04 MED ORDER — SODIUM CHLORIDE 0.9% FLUSH
10.0000 mL | Freq: Once | INTRAVENOUS | Status: AC
  Administered 2022-03-04: 10 mL

## 2022-03-04 MED ORDER — HEPARIN SOD (PORK) LOCK FLUSH 100 UNIT/ML IV SOLN
500.0000 [IU] | Freq: Once | INTRAVENOUS | Status: AC
  Administered 2022-03-04: 500 [IU]

## 2022-03-23 ENCOUNTER — Other Ambulatory Visit: Payer: Self-pay | Admitting: Physician Assistant

## 2022-03-23 DIAGNOSIS — F330 Major depressive disorder, recurrent, mild: Secondary | ICD-10-CM

## 2022-04-01 ENCOUNTER — Inpatient Hospital Stay: Payer: Medicare Other | Attending: Hematology

## 2022-04-01 ENCOUNTER — Inpatient Hospital Stay: Payer: Medicare Other

## 2022-04-01 ENCOUNTER — Ambulatory Visit: Payer: Medicare Other

## 2022-04-01 ENCOUNTER — Inpatient Hospital Stay (HOSPITAL_BASED_OUTPATIENT_CLINIC_OR_DEPARTMENT_OTHER): Payer: Medicare Other | Admitting: Hematology

## 2022-04-01 ENCOUNTER — Other Ambulatory Visit: Payer: Medicare Other

## 2022-04-01 ENCOUNTER — Other Ambulatory Visit: Payer: Self-pay

## 2022-04-01 VITALS — BP 135/68 | HR 69 | Temp 97.5°F | Resp 18 | Wt 161.7 lb

## 2022-04-01 DIAGNOSIS — Z9221 Personal history of antineoplastic chemotherapy: Secondary | ICD-10-CM | POA: Insufficient documentation

## 2022-04-01 DIAGNOSIS — Z86718 Personal history of other venous thrombosis and embolism: Secondary | ICD-10-CM | POA: Diagnosis not present

## 2022-04-01 DIAGNOSIS — Z86711 Personal history of pulmonary embolism: Secondary | ICD-10-CM | POA: Diagnosis not present

## 2022-04-01 DIAGNOSIS — R197 Diarrhea, unspecified: Secondary | ICD-10-CM

## 2022-04-01 DIAGNOSIS — K529 Noninfective gastroenteritis and colitis, unspecified: Secondary | ICD-10-CM

## 2022-04-01 DIAGNOSIS — Z7901 Long term (current) use of anticoagulants: Secondary | ICD-10-CM | POA: Diagnosis not present

## 2022-04-01 DIAGNOSIS — Z7189 Other specified counseling: Secondary | ICD-10-CM

## 2022-04-01 DIAGNOSIS — Z95828 Presence of other vascular implants and grafts: Secondary | ICD-10-CM

## 2022-04-01 DIAGNOSIS — C155 Malignant neoplasm of lower third of esophagus: Secondary | ICD-10-CM

## 2022-04-01 DIAGNOSIS — Z923 Personal history of irradiation: Secondary | ICD-10-CM | POA: Diagnosis not present

## 2022-04-01 DIAGNOSIS — C3491 Malignant neoplasm of unspecified part of right bronchus or lung: Secondary | ICD-10-CM

## 2022-04-01 DIAGNOSIS — Z87891 Personal history of nicotine dependence: Secondary | ICD-10-CM | POA: Diagnosis not present

## 2022-04-01 DIAGNOSIS — C7951 Secondary malignant neoplasm of bone: Secondary | ICD-10-CM | POA: Insufficient documentation

## 2022-04-01 DIAGNOSIS — K521 Toxic gastroenteritis and colitis: Secondary | ICD-10-CM

## 2022-04-01 DIAGNOSIS — C3490 Malignant neoplasm of unspecified part of unspecified bronchus or lung: Secondary | ICD-10-CM

## 2022-04-01 LAB — CMP (CANCER CENTER ONLY)
ALT: 9 U/L (ref 0–44)
AST: 17 U/L (ref 15–41)
Albumin: 3.7 g/dL (ref 3.5–5.0)
Alkaline Phosphatase: 54 U/L (ref 38–126)
Anion gap: 3 — ABNORMAL LOW (ref 5–15)
BUN: 18 mg/dL (ref 8–23)
CO2: 28 mmol/L (ref 22–32)
Calcium: 8.9 mg/dL (ref 8.9–10.3)
Chloride: 107 mmol/L (ref 98–111)
Creatinine: 1.29 mg/dL — ABNORMAL HIGH (ref 0.44–1.00)
GFR, Estimated: 41 mL/min — ABNORMAL LOW (ref >60)
Glucose, Bld: 103 mg/dL — ABNORMAL HIGH (ref 70–99)
Potassium: 4.4 mmol/L (ref 3.5–5.1)
Sodium: 138 mmol/L (ref 135–145)
Total Bilirubin: 0.3 mg/dL (ref 0.3–1.2)
Total Protein: 7.3 g/dL (ref 6.5–8.1)

## 2022-04-01 LAB — CBC WITH DIFFERENTIAL (CANCER CENTER ONLY)
Abs Immature Granulocytes: 0.02 10*3/uL (ref 0.00–0.07)
Basophils Absolute: 0.1 10*3/uL (ref 0.0–0.1)
Basophils Relative: 1 %
Eosinophils Absolute: 0.2 10*3/uL (ref 0.0–0.5)
Eosinophils Relative: 3 %
HCT: 39.8 % (ref 36.0–46.0)
Hemoglobin: 12.7 g/dL (ref 12.0–15.0)
Immature Granulocytes: 0 %
Lymphocytes Relative: 22 %
Lymphs Abs: 1.7 10*3/uL (ref 0.7–4.0)
MCH: 29.9 pg (ref 26.0–34.0)
MCHC: 31.9 g/dL (ref 30.0–36.0)
MCV: 93.6 fL (ref 80.0–100.0)
Monocytes Absolute: 0.7 10*3/uL (ref 0.1–1.0)
Monocytes Relative: 9 %
Neutro Abs: 5 10*3/uL (ref 1.7–7.7)
Neutrophils Relative %: 65 %
Platelet Count: 204 10*3/uL (ref 150–400)
RBC: 4.25 MIL/uL (ref 3.87–5.11)
RDW: 14.7 % (ref 11.5–15.5)
WBC Count: 7.6 10*3/uL (ref 4.0–10.5)
nRBC: 0 % (ref 0.0–0.2)

## 2022-04-01 MED ORDER — HEPARIN SOD (PORK) LOCK FLUSH 100 UNIT/ML IV SOLN
500.0000 [IU] | Freq: Once | INTRAVENOUS | Status: AC
  Administered 2022-04-01: 500 [IU]

## 2022-04-01 MED ORDER — SODIUM CHLORIDE 0.9% FLUSH
10.0000 mL | Freq: Once | INTRAVENOUS | Status: AC
  Administered 2022-04-01: 10 mL

## 2022-04-01 MED ORDER — OCTREOTIDE ACETATE 30 MG IM KIT
30.0000 mg | PACK | Freq: Once | INTRAMUSCULAR | Status: AC
  Administered 2022-04-01: 30 mg via INTRAMUSCULAR
  Filled 2022-04-01: qty 1

## 2022-04-04 ENCOUNTER — Encounter: Payer: Self-pay | Admitting: Hematology

## 2022-04-04 NOTE — Progress Notes (Signed)
? ? ? ?HEMATOLOGY ONCOLOGY CLINIC VISIT NOTE ? ?Date of service:  .04/01/2022 ? ? ?Patient Care Team: ?Ellouise Newer as PCP - General (Physician Assistant) ?[name], MD as Consulting Physician (Hematology and Oncology) ? ?CHIEF COMPLAINTS/PURPOSE OF CONSULTATION:  ?Follow-up for continued evaluation and management of metastatic lung cancer ?Follow-up for esophageal cancer ? ?DIAGNOSIS:  ? ?#1 Metastatic non-small cell lung cancer with bilateral lung nodules and large metastatic lesion in the left Ilium. 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 Also had c diff colitis - treated ?  ?Current Treatment ? ?1) Active surveillance ?2) Xgeva 120mg  [place] q12weeks for bone metastases. ?3) Sandostatin q4weeks for diarrhea  ? ?Previous Treatment ? ?For metastatic lung cancer ?1 Palliative radiation therapy to the large left ilium metastases ?2. IV Nivolumab x 20 cycles (discontinued due to likely immune colitis) ?3. Xgeva 120mg  [place] q4weeks for bone metastases. ? ?For Esophageal adenocarcinoma ?S/p Concurrent carbo/taxol + RT ? ?HISTORY OF PRESENTING ILLNESS: (plz see my previous consultation for details of initial presentation) ? ?INTERVAL HISTORY:  ?Patient is here for continued evaluation and management of her lung adenocarcinoma and esophageal adenocarcinoma. ?She notes no acute new symptoms since her last clinic visit.  Notes some dyspnea on exertion related to her COPD. ?No new cough or chest pain. ?Has been gaining weight since her last clinic visit. ?Patient notes no uncontrolled diarrhea. ?Labs done today were reviewed with her in detail. ? ?MEDICAL HISTORY:  ?Past Medical History:  ?Diagnosis Date  ? Barrett's esophagus   ? Bone neoplasm 06/24/2015  ? Cancer [facility])   ? metastatic poorly differentiated carcinoma. tumor left groin surgical removal with radiation tx.  ? Cataract   ? BILATERAL  ? Cigarette smoker two packs a day or less   ?  Currently still smoking 2 PPD - Not interested in quitting at this time.  ? Colitis 2017  ? Colon polyps   ? hyperplastic, tubular adenomas, tubulovillous adenoma  ? Cough, persistent   ? hx. lung cancer ? primary-being evaluated, unsure of primary site.  ? Depression 06/24/2015  ? Diverticulosis   ? Emphysema of lung ([place])   ? Endometriosis   ? Hysterectomy with BSO at age 1 yrs  ? Esophageal adenocarcinoma ([place]) 08/11/15  ? intramucosal  ? Gastritis   ? GERD (gastroesophageal reflux disease)   ? H/O: pneumonia   ? Hiatal hernia   ? Hyperlipidemia   ? Hypertension 06/24/2015  ? likely improved incidental to 40 lbs weight loss from her neoplasm. No Longer taking med for this as of 08-06-15  ? IBS (irritable bowel syndrome)   ? Pain   ? left hip-persistent"tumor of bone"-radiation tx. 10.  ? Vitamin D deficiency disease   ? ?SURGICAL HISTORY: ?Past Surgical History:  ?Procedure Laterality Date  ? ABDOMINAL HYSTERECTOMY    ? BALLOON DILATION N/A 10/08/2019  ? Procedure: BALLOON DILATION;  Surgeon: [name], MD;  Location: [facility];  Service: Gastroenterology;  Laterality: N/A;  ? BARTHOLIN GLAND CYST EXCISION  81 yo ago  ? Does not want if it was an infected cyst or tumor. Was soon as delivery  ? BIOPSY  01/02/2019  ? Procedure: BIOPSY;  Surgeon: [name], MD;  Location: [facility];  Service: Gastroenterology;;  ? CATARACT EXTRACTION    ? COLONOSCOPY W/ POLYPECTOMY    ? multiple times - last done 09/2014 per patient.  ? ESOPHAGOGASTRODUODENOSCOPY (EGD) WITH PROPOFOL N/A 08/11/2015  ?  Procedure: ESOPHAGOGASTRODUODENOSCOPY (EGD) WITH PROPOFOL;  Surgeon: [name], MD;  Location: [facility];  Service: Gastroenterology;  Laterality: N/A;  ? ESOPHAGOGASTRODUODENOSCOPY (EGD) WITH PROPOFOL N/A 01/02/2019  ? Procedure: ESOPHAGOGASTRODUODENOSCOPY (EGD) WITH PROPOFOL;  Surgeon: [name], MD;  Location: [facility];  Service: Gastroenterology;  Laterality: N/A;  ? ESOPHAGOGASTRODUODENOSCOPY (EGD) WITH PROPOFOL  N/A 10/08/2019  ? Procedure: ESOPHAGOGASTRODUODENOSCOPY (EGD) WITH PROPOFOL;  Surgeon: [name], MD;  Location: [facility];  Service: Gastroenterology;  Laterality: N/A;  ? FLEXIBLE SIGMOIDOSCOPY N/A 06/24/2017  ? Procedure: FLEXIBLE SIGMOIDOSCOPY;  Surgeon: Manus Gunning, MD;  Location: [facility];  Service: Gastroenterology;  Laterality: N/A;  ? GANGLION CYST EXCISION    ? KNEE ARTHROSCOPY  age about 21 yrs  ? TONSILLECTOMY    ? TOTAL ABDOMINAL HYSTERECTOMY W/ BILATERAL SALPINGOOPHORECTOMY  at age 65 yrs  ? For endometriosis  ? ? ?SOCIAL HISTORY: ?Social History  ? ?Socioeconomic History  ? Marital status: Widowed  ?  Spouse name: Not on file  ? Number of children: 2  ? Years of education: Not on file  ? Highest education level: Not on file  ?Occupational History  ? Not on file  ?Tobacco Use  ? Smoking status: Former  ?  Packs/day: 1.00  ?  Years: 60.00  ?  Pack years: 60.00  ?  Types: Cigarettes  ?  Quit date: 12/05/2014  ?  Years since quitting: 7.3  ? Smokeless tobacco: Never  ?Vaping Use  ? Vaping Use: Never used  ?Substance and Sexual Activity  ? Alcohol use: No  ?  Alcohol/week: 0.0 standard drinks  ? Drug use: No  ? Sexual activity: Not Currently  ?Other Topics Concern  ? Not on file  ?Social History Narrative  ? Not on file  ? ?Social Determinants of Health  ? ?Financial Resource Strain: Not on file  ?Food Insecurity: Not on file  ?Transportation Needs: Not on file  ?Physical Activity: Not on file  ?Stress: Not on file  ?Social Connections: Not on file  ?Intimate Partner Violence: Not on file  ? ? ?FAMILY HISTORY: ?Family History  ?Problem Relation Age of Onset  ? Colon cancer Brother   ? Colon cancer Brother   ? Stroke Mother   ? Colon cancer Father   ? Emphysema Father   ?     smoked  ? Breast cancer Daughter 64  ?     ER/PR+ stage II  ? ? ?ALLERGIES:  is allergic to penicillins, remeron [mirtazapine], and latex. patient wonders if she has a penicillin allergy but notes that she is  uncertain about this. ? ?MEDICATIONS:  ?Current Outpatient Medications  ?Medication Sig Dispense Refill  ? Biotin 5 MG CAPS Take 5 mg by mouth daily.    ? Calcium Citrate-Vitamin D (CALCIUM + D PO) Take 1 tablet by mouth daily.    ? Cyanocobalamin (B-12) 2500 MCG TABS Take 2,500 mcg by mouth daily.    ? escitalopram (LEXAPRO) 20 MG tablet TAKE 1 TABLET BY MOUTH EVERY DAY 90 tablet 1  ? Fluticasone-Umeclidin-Vilant (TRELEGY ELLIPTA) 100-62.5-25 MCG/ACT AEPB Inhale 1 puff into the lungs daily. 1 each 6  ? nystatin (MYCOSTATIN) 100000 UNIT/ML suspension Take 5 mLs (500,000 Units total) by mouth 4 (four) times daily. 200 mL 0  ? omeprazole (PRILOSEC) 40 MG capsule TAKE 1 CAPSULE BY MOUTH EVERY DAY BEFORE BREAKFAST 90 capsule 1  ? traZODone (DESYREL) 50 MG tablet TAKE 1 TABLET BY MOUTH EVERYDAY AT BEDTIME 90 tablet 2  ?  triamcinolone cream (KENALOG) 0.1 % APPLY TO AFFECTED AREA TWICE A DAY 30 g 0  ? XARELTO 20 MG TABS tablet TAKE 1 TABLET BY MOUTH DAILY WITH SUPPER 30 tablet 2  ? albuterol (VENTOLIN HFA) 108 (90 Base) MCG/ACT inhaler Inhale 2 puffs into the lungs every 6 (six) hours as needed for wheezing or shortness of breath. (Patient not taking: Reported on 04/01/2022) 8 g 2  ? busPIRone (BUSPAR) 10 MG tablet TAKE 1 TABLET BY MOUTH THREE TIMES A DAY (Patient not taking: Reported on 04/01/2022) 270 tablet 0  ? HYDROcodone-acetaminophen (NORCO) 5-325 MG tablet Take 1 tablet by mouth every 6 (six) hours as needed for moderate pain or severe pain. (Patient not taking: Reported on 04/01/2022) 30 tablet 0  ? ?No current facility-administered medications for this visit.  ? ? ?PHYSICAL EXAMINATION: Telemedicine visit ? ?LABORATORY DATA:  ?I have reviewed the data as listed ? ?. ? ?  Latest Ref Rng & Units 04/01/2022  ? 11:14 AM 03/04/2022  ? 10:57 AM 02/04/2022  ? 11:08 AM  ?CBC  ?WBC 4.0 - 10.5 K/uL 7.6   7.1   7.3    ?Hemoglobin 12.0 - 15.0 g/dL 12.7   12.8   12.6    ?Hematocrit 36.0 - 46.0 % 39.8   40.1   40.2    ?Platelets 150  - 400 K/uL 204   222   207    ? ?ANC 1.8k ?. ? ?  Latest Ref Rng & Units 04/01/2022  ? 11:14 AM 03/04/2022  ? 10:57 AM 02/04/2022  ? 11:08 AM  ?CMP  ?Glucose 70 - 99 mg/dL 103   129   113    ?BUN 8 - 23 mg/dL 18   16

## 2022-04-05 ENCOUNTER — Telehealth: Payer: Self-pay | Admitting: Hematology

## 2022-04-05 NOTE — Telephone Encounter (Signed)
Scheduled follow-up appointments per 4/28 los. Patient is aware. Mailed calendar. ?

## 2022-04-22 NOTE — Patient Instructions (Signed)

## 2022-04-25 ENCOUNTER — Ambulatory Visit (INDEPENDENT_AMBULATORY_CARE_PROVIDER_SITE_OTHER): Payer: Medicare Other | Admitting: Physician Assistant

## 2022-04-25 ENCOUNTER — Encounter: Payer: Self-pay | Admitting: Physician Assistant

## 2022-04-25 VITALS — BP 109/62 | HR 82 | Temp 97.7°F | Ht 68.5 in | Wt 161.0 lb

## 2022-04-25 DIAGNOSIS — E785 Hyperlipidemia, unspecified: Secondary | ICD-10-CM | POA: Diagnosis not present

## 2022-04-25 DIAGNOSIS — F330 Major depressive disorder, recurrent, mild: Secondary | ICD-10-CM

## 2022-04-25 DIAGNOSIS — F411 Generalized anxiety disorder: Secondary | ICD-10-CM | POA: Diagnosis not present

## 2022-04-25 DIAGNOSIS — J432 Centrilobular emphysema: Secondary | ICD-10-CM | POA: Diagnosis not present

## 2022-04-25 DIAGNOSIS — I10 Essential (primary) hypertension: Secondary | ICD-10-CM | POA: Diagnosis not present

## 2022-04-25 MED ORDER — TRELEGY ELLIPTA 200-62.5-25 MCG/ACT IN AEPB: 1.0000 | INHALATION_SPRAY | Freq: Every day | RESPIRATORY_TRACT | 3 refills | Status: DC

## 2022-04-25 MED ORDER — BUPROPION HCL ER (XL) 150 MG PO TB24: 150.0000 mg | ORAL_TABLET | Freq: Every day | ORAL | 1 refills | Status: DC

## 2022-04-25 NOTE — Assessment & Plan Note (Signed)
-  Patient managing with diet. Soft blood pressure today. Will continue to monitor.

## 2022-04-25 NOTE — Assessment & Plan Note (Signed)
-  GAD-7 score of 6. Will start Wellbutrin XL 150 mg daily and continue with Lexapro 20 mg. Will reassess mood and medication therapy in 8 weeks.

## 2022-04-25 NOTE — Assessment & Plan Note (Addendum)
-  Last lipid panel: HDL 44, LDL 104 -Will repeat lipid panel at f/up visit. -Recommend to follow a heart healthy diet. -Will continue to monitor.

## 2022-04-25 NOTE — Assessment & Plan Note (Addendum)
-  Mild improvement in PHQ-9 score today so discussed adjunct medication therapy. Patient is agreeable to starting Wellbutrin XL 150 mg daily. Will continue Lexapro 20 mg. Will reassess mood and medication therapy in 8 weeks.  -Advised can trial Trazodone 100 mg. If sleep fails to improve then we can consider alternatives.

## 2022-04-25 NOTE — Progress Notes (Signed)
Established patient visit   Patient: Cindy Byrd   DOB: 16-Aug-1941   81 y.o. Female  MRN: [id number] Visit Date: 04/25/2022  Chief Complaint  Patient presents with   Follow-up   Subjective    HPI  Patient presents for follow-up on mood, hypertension and hyperlipidemia.   Mood: Patient reports tolerating Lexapro 20 mg. States has not noticed much of a difference with her mood. No SI/HI. States continues to struggle with sleep. Trazodone 50 mg has been ineffective.   HTN: Pt denies chest pain, palpitations, dizziness or lower extremity swelling. Patient reports trying to stay hydrated. Patient not taking blood pressure medication.   HLD: Pt managing without medication. Patient reports appetite has been good. Tries to have a balanced diet.   Emphysema: Patient reports has noticed some improvement with shortness of breath but still gets short winded with light activity especially when working outside.     04/25/2022    2:00 PM 01/24/2022    1:49 PM 11/26/2021    8:39 AM 06/22/2021   11:25 AM 03/17/2021    3:37 PM  Depression screen PHQ 2/9  Decreased Interest 1 1 1 1  0  Down, Depressed, Hopeless 1 1 1  0 0  PHQ - 2 Score 2 2 2 1  0  Altered sleeping 2 3 3 1 1   Tired, decreased energy 2 3 3 3 3   Change in appetite 0 0 1 0 1  Feeling bad or failure about yourself  1 1 1  0 0  Trouble concentrating 1 1 1 1  0  Moving slowly or fidgety/restless 0 0 0 0 0  Suicidal thoughts 0 0 0 0 0  PHQ-9 Score 8 10 11 6 5   Difficult doing work/chores Somewhat difficult Not difficult at all Not difficult at all        04/25/2022    1:52 PM 01/24/2022    1:49 PM 11/26/2021    8:39 AM 06/22/2021   11:26 AM  GAD 7 : Generalized Anxiety Score  Nervous, Anxious, on Edge 1 1 0 3  Control/stop worrying 1 1 1 2   Worry too much - different things 1 1 1 2   Trouble relaxing 1 1 0 1  Restless 1 1 0 0  Easily annoyed or irritable 1 1 1  0  Afraid - awful might happen 0 1 3 0  Total GAD 7 Score 6 7 6 8    Anxiety Difficulty Somewhat difficult Somewhat difficult Not difficult at all         Medications: Outpatient Medications Prior to Visit  Medication Sig   Biotin 5 MG CAPS Take 5 mg by mouth daily.   Calcium Citrate-Vitamin D (CALCIUM + D PO) Take 1 tablet by mouth daily.   Cyanocobalamin (B-12) 2500 MCG TABS Take 2,500 mcg by mouth daily.   escitalopram (LEXAPRO) 20 MG tablet TAKE 1 TABLET BY MOUTH EVERY DAY   HYDROcodone-acetaminophen (NORCO) 5-325 MG tablet Take 1 tablet by mouth every 6 (six) hours as needed for moderate pain or severe pain.   nystatin (MYCOSTATIN) 100000 UNIT/ML suspension Take 5 mLs (500,000 Units total) by mouth 4 (four) times daily.   omeprazole (PRILOSEC) 40 MG capsule TAKE 1 CAPSULE BY MOUTH EVERY DAY BEFORE BREAKFAST   traZODone (DESYREL) 50 MG tablet TAKE 1 TABLET BY MOUTH EVERYDAY AT BEDTIME   triamcinolone cream (KENALOG) 0.1 % APPLY TO AFFECTED AREA TWICE A DAY   XARELTO 20 MG TABS tablet TAKE 1 TABLET BY MOUTH DAILY WITH SUPPER   [DISCONTINUED]  Fluticasone-Umeclidin-Vilant (TRELEGY ELLIPTA) 100-62.5-25 MCG/ACT AEPB Inhale 1 puff into the lungs daily.   [DISCONTINUED] albuterol (VENTOLIN HFA) 108 (90 Base) MCG/ACT inhaler Inhale 2 puffs into the lungs every 6 (six) hours as needed for wheezing or shortness of breath. (Patient not taking: Reported on 04/01/2022)   [DISCONTINUED] busPIRone (BUSPAR) 10 MG tablet TAKE 1 TABLET BY MOUTH THREE TIMES A DAY (Patient not taking: Reported on 04/01/2022)   No facility-administered medications prior to visit.    Review of Systems Review of Systems:  A fourteen system review of systems was performed and found to be positive as per HPI.  Last CBC Lab Results  Component Value Date   WBC 7.6 04/01/2022   HGB 12.7 04/01/2022   HCT 39.8 04/01/2022   MCV 93.6 04/01/2022   MCH 29.9 04/01/2022   RDW 14.7 04/01/2022   PLT 204 16/09/9603   Last metabolic panel Lab Results  Component Value Date   GLUCOSE 103 (H)  04/01/2022   NA 138 04/01/2022   K 4.4 04/01/2022   CL 107 04/01/2022   CO2 28 04/01/2022   BUN 18 04/01/2022   CREATININE 1.29 (H) 04/01/2022   GFRNONAA 41 (L) 04/01/2022   CALCIUM 8.9 04/01/2022   PHOS 4.5 05/01/2019   PROT 7.3 04/01/2022   ALBUMIN 3.7 04/01/2022   LABGLOB 3.3 11/26/2021   AGRATIO 1.2 11/26/2021   BILITOT 0.3 04/01/2022   ALKPHOS 54 04/01/2022   AST 17 04/01/2022   ALT 9 04/01/2022   ANIONGAP 3 (L) 04/01/2022   Last lipids Lab Results  Component Value Date   CHOL 171 11/26/2021   HDL 44 11/26/2021   LDLCALC 104 (H) 11/26/2021   TRIG 127 11/26/2021   CHOLHDL 3.9 11/26/2021   Last hemoglobin A1c Lab Results  Component Value Date   HGBA1C 6.6 (H) 11/26/2021   Last thyroid functions Lab Results  Component Value Date   TSH 2.740 12/13/2019   T3TOTAL 118 04/16/2018   Last vitamin D Lab Results  Component Value Date   VD25OH 23.5 (L) 11/26/2021       04/25/2022    2:00 PM 01/24/2022    1:49 PM 11/26/2021    8:39 AM 06/22/2021   11:25 AM 03/17/2021    3:37 PM  Depression screen PHQ 2/9  Decreased Interest 1 1 1 1  0  Down, Depressed, Hopeless 1 1 1  0 0  PHQ - 2 Score 2 2 2 1  0  Altered sleeping 2 3 3 1 1   Tired, decreased energy 2 3 3 3 3   Change in appetite 0 0 1 0 1  Feeling bad or failure about yourself  1 1 1  0 0  Trouble concentrating 1 1 1 1  0  Moving slowly or fidgety/restless 0 0 0 0 0  Suicidal thoughts 0 0 0 0 0  PHQ-9 Score 8 10 11 6 5   Difficult doing work/chores Somewhat difficult Not difficult at all Not difficult at all        04/25/2022    1:52 PM 01/24/2022    1:49 PM 11/26/2021    8:39 AM 06/22/2021   11:26 AM  GAD 7 : Generalized Anxiety Score  Nervous, Anxious, on Edge 1 1 0 3  Control/stop worrying 1 1 1 2   Worry too much - different things 1 1 1 2   Trouble relaxing 1 1 0 1  Restless 1 1 0 0  Easily annoyed or irritable 1 1 1  0  Afraid - awful might happen 0 1 3  0  Total GAD 7 Score 6 7 6 8   Anxiety Difficulty  Somewhat difficult Somewhat difficult Not difficult at all         Objective    BP 109/62   Pulse 82   Temp 97.7 F (36.5 C)   Ht 5' 8.5" (1.74 m)   Wt 161 lb (73 kg)   SpO2 94%   BMI 24.12 kg/m  BP Readings from Last 3 Encounters:  04/25/22 109/62  04/01/22 135/68  03/04/22 128/62   Wt Readings from Last 3 Encounters:  04/25/22 161 lb (73 kg)  04/01/22 161 lb 11.2 oz (73.3 kg)  01/24/22 153 lb (69.4 kg)    Physical Exam  General:  Well Developed, well nourished, appropriate for stated age.  Neuro:  Alert and oriented,  extra-ocular muscles intact  HEENT:  Normocephalic, atraumatic, neck supple  Skin:  no gross rash, warm, pink. Cardiac:  RRR, S1 S2 Respiratory: CTA B/L w/ dec air movement  Vascular:  Ext warm, no cyanosis apprec.; cap RF less 2 sec. Psych:  No HI/SI, judgement and insight good, Euthymic mood. Full Affect.   No results found for any visits on 04/25/22.  Assessment & Plan      Problem List Items Addressed This Visit       Cardiovascular and Mediastinum   HTN (hypertension)    -Patient managing with diet. Soft blood pressure today. Will continue to monitor.         Other   Hyperlipidemia (Chronic)    -Last lipid panel: HDL 44, LDL 104 -Will repeat lipid panel at f/up visit. -Recommend to follow a heart healthy diet. -Will continue to monitor.       Depression - Primary    -Mild improvement in PHQ-9 score today so discussed adjunct medication therapy. Patient is agreeable to starting Wellbutrin XL 150 mg daily. Will continue Lexapro 20 mg. Will reassess mood and medication therapy in 8 weeks.  -Advised can trial Trazodone 100 mg. If sleep fails to improve then we can consider alternatives.       Relevant Medications   buPROPion (WELLBUTRIN XL) 150 MG 24 hr tablet   GAD (generalized anxiety disorder)    -GAD-7 score of 6. Will start Wellbutrin XL 150 mg daily and continue with Lexapro 20 mg. Will reassess mood and medication therapy  in 8 weeks.        Relevant Medications   buPROPion (WELLBUTRIN XL) 150 MG 24 hr tablet   Other Visit Diagnoses     Centrilobular emphysema ([place])       Relevant Medications   Fluticasone-Umeclidin-Vilant (TRELEGY ELLIPTA) 200-62.5-25 MCG/ACT AEPB      Centrolobular emphysema: -Minimal improvement. Will increase Trelegy to 200-62.5-25 MCG/ACT.    Return in about 8 weeks (around 06/20/2022) for Mood- added med, HLD, emphysema and FBW.        [name], PA-C  [facility] at [facility] [phone] (phone) [phone] (fax)  [place]

## 2022-04-29 ENCOUNTER — Inpatient Hospital Stay: Payer: Medicare Other

## 2022-05-05 ENCOUNTER — Ambulatory Visit
Admission: RE | Admit: 2022-05-05 | Discharge: 2022-05-05 | Disposition: A | Discharge status: Home / Self Care | Payer: Medicare Other | Source: Ambulatory Visit | Attending: Physician Assistant | Admitting: Physician Assistant

## 2022-05-05 ENCOUNTER — Ambulatory Visit (INDEPENDENT_AMBULATORY_CARE_PROVIDER_SITE_OTHER): Payer: Medicare Other | Admitting: Physician Assistant

## 2022-05-05 ENCOUNTER — Encounter: Payer: Self-pay | Admitting: Physician Assistant

## 2022-05-05 VITALS — BP 112/73 | HR 86 | Temp 97.5°F | Ht 68.5 in | Wt 154.0 lb

## 2022-05-05 DIAGNOSIS — R0602 Shortness of breath: Secondary | ICD-10-CM

## 2022-05-05 DIAGNOSIS — J22 Unspecified acute lower respiratory infection: Secondary | ICD-10-CM

## 2022-05-05 DIAGNOSIS — R079 Chest pain, unspecified: Secondary | ICD-10-CM | POA: Diagnosis not present

## 2022-05-05 MED ORDER — PREDNISONE 20 MG PO TABS: ORAL_TABLET | ORAL | 0 refills | Status: DC

## 2022-05-05 MED ORDER — METHYLPREDNISOLONE SODIUM SUCC 125 MG IJ SOLR
125.0000 mg | Freq: Once | INTRAMUSCULAR | Status: AC
  Administered 2022-05-05: 125 mg via INTRAMUSCULAR

## 2022-05-05 MED ORDER — DOXYCYCLINE HYCLATE 100 MG PO TABS: 100.0000 mg | ORAL_TABLET | Freq: Two times a day (BID) | ORAL | 0 refills | Status: DC

## 2022-05-05 NOTE — Progress Notes (Signed)
Established patient acute visit   Patient: Cindy Byrd   DOB: 1941-08-18   81 y.o. Female  MRN: [id number] Visit Date: 05/05/2022  No chief complaint on file.  Subjective    HPI  Patient presents with c/o cough and shortness of breath. Feels like her chest is sore and it is hard to breath. Symptoms started last week (8 days ago). States her cough is mild. Has been feeling weak and with no appetite. Does report night sweats. No fever, chills, sinus pressure, chest pain, palpitations, nasal congestion or confusion. Patient states used new dose of Trelegy once the day before her symptoms started and has not used it since.      Medications: Outpatient Medications Prior to Visit  Medication Sig   Biotin 5 MG CAPS Take 5 mg by mouth daily.   buPROPion (WELLBUTRIN XL) 150 MG 24 hr tablet Take 1 tablet (150 mg total) by mouth daily.   Calcium Citrate-Vitamin D (CALCIUM + D PO) Take 1 tablet by mouth daily.   Cyanocobalamin (B-12) 2500 MCG TABS Take 2,500 mcg by mouth daily.   escitalopram (LEXAPRO) 20 MG tablet TAKE 1 TABLET BY MOUTH EVERY DAY   Fluticasone-Umeclidin-Vilant (TRELEGY ELLIPTA) 200-62.5-25 MCG/ACT AEPB Inhale 1 puff into the lungs daily.   HYDROcodone-acetaminophen (NORCO) 5-325 MG tablet Take 1 tablet by mouth every 6 (six) hours as needed for moderate pain or severe pain.   nystatin (MYCOSTATIN) 100000 UNIT/ML suspension Take 5 mLs (500,000 Units total) by mouth 4 (four) times daily.   omeprazole (PRILOSEC) 40 MG capsule TAKE 1 CAPSULE BY MOUTH EVERY DAY BEFORE BREAKFAST   traZODone (DESYREL) 50 MG tablet TAKE 1 TABLET BY MOUTH EVERYDAY AT BEDTIME   triamcinolone cream (KENALOG) 0.1 % APPLY TO AFFECTED AREA TWICE A DAY   XARELTO 20 MG TABS tablet TAKE 1 TABLET BY MOUTH DAILY WITH SUPPER   No facility-administered medications prior to visit.    Review of Systems Review of Systems:  A fourteen system review of systems was performed and found to be positive as per  HPI.   Last CBC Lab Results  Component Value Date   WBC 7.6 04/01/2022   HGB 12.7 04/01/2022   HCT 39.8 04/01/2022   MCV 93.6 04/01/2022   MCH 29.9 04/01/2022   RDW 14.7 04/01/2022   PLT 204 66/05/3015   Last metabolic panel Lab Results  Component Value Date   GLUCOSE 103 (H) 04/01/2022   NA 138 04/01/2022   K 4.4 04/01/2022   CL 107 04/01/2022   CO2 28 04/01/2022   BUN 18 04/01/2022   CREATININE 1.29 (H) 04/01/2022   GFRNONAA 41 (L) 04/01/2022   CALCIUM 8.9 04/01/2022   PHOS 4.5 05/01/2019   PROT 7.3 04/01/2022   ALBUMIN 3.7 04/01/2022   LABGLOB 3.3 11/26/2021   AGRATIO 1.2 11/26/2021   BILITOT 0.3 04/01/2022   ALKPHOS 54 04/01/2022   AST 17 04/01/2022   ALT 9 04/01/2022   ANIONGAP 3 (L) 04/01/2022   Last lipids Lab Results  Component Value Date   CHOL 171 11/26/2021   HDL 44 11/26/2021   LDLCALC 104 (H) 11/26/2021   TRIG 127 11/26/2021   CHOLHDL 3.9 11/26/2021   Last hemoglobin A1c Lab Results  Component Value Date   HGBA1C 6.6 (H) 11/26/2021   Last thyroid functions Lab Results  Component Value Date   TSH 2.740 12/13/2019   T3TOTAL 118 04/16/2018   Last vitamin D Lab Results  Component Value Date   VD25OH 23.5 (L)  11/26/2021     Objective    BP 112/73 (BP Location: Left Arm, Patient Position: Sitting)   Pulse 86   Temp (!) 97.5 F (36.4 C) (Oral)   Ht 5' 8.5" (1.74 m)   Wt 154 lb (69.9 kg)   SpO2 93%   BMI 23.08 kg/m    Physical Exam  General: Cooperative, non-diaphoretic, ill-appearing  Neuro:  Alert and oriented,  extra-ocular muscles intact  HEENT:  Normocephalic, atraumatic, PERRL, no sinus tenderness, normal TM's of both ears, pink nasal mucosa, normal posterior oropharynx, neck supple, no adenopathy  Chest: chest wall tenderness of lower sternum   Skin:  no gross rash, warm, pink. Cardiac:  RRR, S1 S2 Respiratory: Expiratory wheezing with dec air movement Vascular:  Ext warm, no cyanosis apprec.; cap RF less 2 sec. Psych:   No HI/SI, judgement and insight good, Euthymic mood. Full Affect.   No results found for any visits on 05/05/22.  Assessment & Plan     Discussed with patient has s/sx concerning for bacterial lower respiratory infection. Symptoms ongoing for >7 days and patient is immunocompromised so will start antibiotic therapy with doxycycline 100 mg BID x 10 days and placed stat order for chest x-ray. Offered albuterol inhaler, pt declined because it previously did not help. Will administer Solu-Medrol 125 mg in office today. Advised to start short course of oral steroid tomorrow. If symptoms worsen recommend going to the ED for evaluation. Oxygen saturation at baseline. Will reassess symptoms and medication therapy next week.    Return in about 1 week (around 05/12/2022) for LRI.        [name], PA-C  [facility] at [facility] [phone] (phone) [phone] (fax)  [place]

## 2022-05-05 NOTE — Patient Instructions (Signed)
[place], [place]

## 2022-05-11 NOTE — Patient Instructions (Incomplete)
Managing Anxiety, Adult After being diagnosed with anxiety, you may be relieved to know why you have felt or behaved a certain way. You may also feel overwhelmed about the treatment ahead and what it will mean for your life. With care and support, you can manage this condition. How to manage lifestyle changes Managing stress and anxiety  Stress is your body's reaction to life changes and events, both good and bad. Most stress will last just a few hours, but stress can be ongoing and can lead to more than just stress. Although stress can play a major role in anxiety, it is not the same as anxiety. Stress is usually caused by something external, such as a deadline, test, or competition. Stress normally passes after the triggering event has ended.  Anxiety is caused by something internal, such as imagining a terrible outcome or worrying that something will go wrong that will devastate you. Anxiety often does not go away even after the triggering event is over, and it can become long-term (chronic) worry. It is important to understand the differences between stress and anxiety and to manage your stress effectively so that it does not lead to an anxious response. Talk with your health care provider or a counselor to learn more about reducing anxiety and stress. He or she may suggest tension reduction techniques, such as: Music therapy. Spend time creating or listening to music that you enjoy and that inspires you. Mindfulness-based meditation. Practice being aware of your normal breaths while not trying to control your breathing. It can be done while sitting or walking. Centering prayer. This involves focusing on a word, phrase, or sacred image that means something to you and brings you peace. Deep breathing. To do this, expand your stomach and inhale slowly through your nose. Hold your breath for 3-5 seconds. Then exhale slowly, letting your stomach muscles relax. Self-talk. Learn to notice and identify  thought patterns that lead to anxiety reactions and change those patterns to thoughts that feel peaceful. Muscle relaxation. Taking time to tense muscles and then relax them. Choose a tension reduction technique that fits your lifestyle and personality. These techniques take time and practice. Set aside 5-15 minutes a day to do them. Therapists can offer counseling and training in these techniques. The training to help with anxiety may be covered by some insurance plans. Other things you can do to manage stress and anxiety include: Keeping a stress diary. This can help you learn what triggers your reaction and then learn ways to manage your response. Thinking about how you react to certain situations. You may not be able to control everything, but you can control your response. Making time for activities that help you relax and not feeling guilty about spending your time in this way. Doing visual imagery. This involves imagining or creating mental pictures to help you relax. Practicing yoga. Through yoga poses, you can lower tension and promote relaxation.  Medicines Medicines can help ease symptoms. Medicines for anxiety include: Antidepressant medicines. These are usually prescribed for long-term daily control. Anti-anxiety medicines. These may be added in severe cases, especially when panic attacks occur. Medicines will be prescribed by a health care provider. When used together, medicines, psychotherapy, and tension reduction techniques may be the most effective treatment. Relationships Relationships can play a big part in helping you recover. Try to spend more time connecting with trusted friends and family members. Consider going to couples counseling if you have a partner, taking family education classes, or going to   family therapy. Therapy can help you and others better understand your condition. How to recognize changes in your anxiety Everyone responds differently to treatment for  anxiety. Recovery from anxiety happens when symptoms decrease and stop interfering with your daily activities at home or work. This may mean that you will start to: Have better concentration and focus. Worry will interfere less in your daily thinking. Sleep better. Be less irritable. Have more energy. Have improved memory. It is also important to recognize when your condition is getting worse. Contact your health care provider if your symptoms interfere with home or work and you feel like your condition is not improving. Follow these instructions at home: Activity Exercise. Adults should do the following: Exercise for at least 150 minutes each week. The exercise should increase your heart rate and make you sweat (moderate-intensity exercise). Strengthening exercises at least twice a week. Get the right amount and quality of sleep. Most adults need 7-9 hours of sleep each night. Lifestyle  Eat a healthy diet that includes plenty of vegetables, fruits, whole grains, low-fat dairy products, and lean protein. Do not eat a lot of foods that are high in fats, added sugars, or salt (sodium). Make choices that simplify your life. Do not use any products that contain nicotine or tobacco. These products include cigarettes, chewing tobacco, and vaping devices, such as e-cigarettes. If you need help quitting, ask your health care provider. Avoid caffeine, alcohol, and certain over-the-counter cold medicines. These may make you feel worse. Ask your pharmacist which medicines to avoid. General instructions Take over-the-counter and prescription medicines only as told by your health care provider. Keep all follow-up visits. This is important. Where to find support You can get help and support from these sources: Self-help groups. Online and community organizations. A trusted spiritual leader. Couples counseling. Family education classes. Family therapy. Where to find more information You may find  that joining a support group helps you deal with your anxiety. The following sources can help you locate counselors or support groups near you: Mental Health America: www.mentalhealthamerica.net Anxiety and Depression Association of America (ADAA): www.adaa.org National Alliance on Mental Illness (NAMI): www.nami.org Contact a health care provider if: You have a hard time staying focused or finishing daily tasks. You spend many hours a day feeling worried about everyday life. You become exhausted by worry. You start to have headaches or frequently feel tense. You develop chronic nausea or diarrhea. Get help right away if: You have a racing heart and shortness of breath. You have thoughts of hurting yourself or others. If you ever feel like you may hurt yourself or others, or have thoughts about taking your own life, get help right away. Go to your nearest emergency department or: Call your local emergency services (911 in the [place] Call a suicide crisis helpline, such as the National Suicide Prevention Lifeline at 1-800-273-8255 or 988 in the [place] This is open 24 hours a day in the [place] Text the Crisis Text Line at 741741 (in the [place] Summary Taking steps to learn and use tension reduction techniques can help calm you and help prevent triggering an anxiety reaction. When used together, medicines, psychotherapy, and tension reduction techniques may be the most effective treatment. Family, friends, and partners can play a big part in supporting you. This information is not intended to replace advice given to you by your health care provider. Make sure you discuss any questions you have with your health care provider. Document Revised: 06/16/2021 Document Reviewed: 03/14/2021 Elsevier   Patient Education  2023 Elsevier Inc.  

## 2022-05-12 ENCOUNTER — Emergency Department (HOSPITAL_COMMUNITY): Payer: Medicare Other

## 2022-05-12 ENCOUNTER — Ambulatory Visit: Payer: Medicare Other | Admitting: Physician Assistant

## 2022-05-12 ENCOUNTER — Encounter (HOSPITAL_COMMUNITY): Payer: Self-pay | Admitting: Emergency Medicine

## 2022-05-12 ENCOUNTER — Emergency Department (HOSPITAL_COMMUNITY)
Admission: EM | Admit: 2022-05-12 | Discharge: 2022-05-12 | Disposition: A | Discharge status: Home / Self Care | Payer: Medicare Other | Attending: Emergency Medicine | Admitting: Emergency Medicine

## 2022-05-12 DIAGNOSIS — Z91040 Latex allergy status: Secondary | ICD-10-CM | POA: Insufficient documentation

## 2022-05-12 DIAGNOSIS — K5730 Diverticulosis of large intestine without perforation or abscess without bleeding: Secondary | ICD-10-CM | POA: Diagnosis not present

## 2022-05-12 DIAGNOSIS — R6889 Other general symptoms and signs: Secondary | ICD-10-CM | POA: Diagnosis not present

## 2022-05-12 DIAGNOSIS — I499 Cardiac arrhythmia, unspecified: Secondary | ICD-10-CM | POA: Diagnosis not present

## 2022-05-12 DIAGNOSIS — R1114 Bilious vomiting: Secondary | ICD-10-CM | POA: Diagnosis not present

## 2022-05-12 DIAGNOSIS — Z743 Need for continuous supervision: Secondary | ICD-10-CM | POA: Diagnosis not present

## 2022-05-12 DIAGNOSIS — K449 Diaphragmatic hernia without obstruction or gangrene: Secondary | ICD-10-CM | POA: Diagnosis not present

## 2022-05-12 DIAGNOSIS — R1110 Vomiting, unspecified: Secondary | ICD-10-CM | POA: Diagnosis not present

## 2022-05-12 DIAGNOSIS — R7989 Other specified abnormal findings of blood chemistry: Secondary | ICD-10-CM | POA: Diagnosis not present

## 2022-05-12 DIAGNOSIS — N390 Urinary tract infection, site not specified: Secondary | ICD-10-CM | POA: Insufficient documentation

## 2022-05-12 DIAGNOSIS — R531 Weakness: Secondary | ICD-10-CM | POA: Diagnosis not present

## 2022-05-12 DIAGNOSIS — R5383 Other fatigue: Secondary | ICD-10-CM | POA: Diagnosis not present

## 2022-05-12 DIAGNOSIS — I700 Atherosclerosis of aorta: Secondary | ICD-10-CM | POA: Diagnosis not present

## 2022-05-12 DIAGNOSIS — R112 Nausea with vomiting, unspecified: Secondary | ICD-10-CM | POA: Insufficient documentation

## 2022-05-12 DIAGNOSIS — R109 Unspecified abdominal pain: Secondary | ICD-10-CM | POA: Diagnosis not present

## 2022-05-12 DIAGNOSIS — Z8501 Personal history of malignant neoplasm of esophagus: Secondary | ICD-10-CM | POA: Diagnosis not present

## 2022-05-12 DIAGNOSIS — Z85118 Personal history of other malignant neoplasm of bronchus and lung: Secondary | ICD-10-CM | POA: Diagnosis not present

## 2022-05-12 DIAGNOSIS — D72829 Elevated white blood cell count, unspecified: Secondary | ICD-10-CM | POA: Insufficient documentation

## 2022-05-12 DIAGNOSIS — R1013 Epigastric pain: Secondary | ICD-10-CM | POA: Diagnosis not present

## 2022-05-12 DIAGNOSIS — I10 Essential (primary) hypertension: Secondary | ICD-10-CM | POA: Diagnosis not present

## 2022-05-12 DIAGNOSIS — I491 Atrial premature depolarization: Secondary | ICD-10-CM | POA: Diagnosis not present

## 2022-05-12 LAB — COMPREHENSIVE METABOLIC PANEL
ALT: 36 U/L (ref 0–44)
AST: 43 U/L — ABNORMAL HIGH (ref 15–41)
Albumin: 3.7 g/dL (ref 3.5–5.0)
Alkaline Phosphatase: 92 U/L (ref 38–126)
Anion gap: 12 (ref 5–15)
BUN: 37 mg/dL — ABNORMAL HIGH (ref 8–23)
CO2: 21 mmol/L — ABNORMAL LOW (ref 22–32)
Calcium: 10.3 mg/dL (ref 8.9–10.3)
Chloride: 108 mmol/L (ref 98–111)
Creatinine, Ser: 1.52 mg/dL — ABNORMAL HIGH (ref 0.44–1.00)
GFR, Estimated: 34 mL/min — ABNORMAL LOW (ref >60)
Glucose, Bld: 186 mg/dL — ABNORMAL HIGH (ref 70–99)
Potassium: 4.8 mmol/L (ref 3.5–5.1)
Sodium: 141 mmol/L (ref 135–145)
Total Bilirubin: 0.5 mg/dL (ref 0.3–1.2)
Total Protein: 8.2 g/dL — ABNORMAL HIGH (ref 6.5–8.1)

## 2022-05-12 LAB — CBC
HCT: 49.1 % — ABNORMAL HIGH (ref 36.0–46.0)
Hemoglobin: 15.7 g/dL — ABNORMAL HIGH (ref 12.0–15.0)
MCH: 29.7 pg (ref 26.0–34.0)
MCHC: 32 g/dL (ref 30.0–36.0)
MCV: 93 fL (ref 80.0–100.0)
Platelets: 532 10*3/uL — ABNORMAL HIGH (ref 150–400)
RBC: 5.28 MIL/uL — ABNORMAL HIGH (ref 3.87–5.11)
RDW: 14.3 % (ref 11.5–15.5)
WBC: 23.1 10*3/uL — ABNORMAL HIGH (ref 4.0–10.5)
nRBC: 0 % (ref 0.0–0.2)

## 2022-05-12 LAB — URINALYSIS, ROUTINE W REFLEX MICROSCOPIC
Bilirubin Urine: NEGATIVE
Glucose, UA: NEGATIVE mg/dL
Ketones, ur: NEGATIVE mg/dL
Nitrite: POSITIVE — AB
Protein, ur: NEGATIVE mg/dL
Specific Gravity, Urine: 1.039 — ABNORMAL HIGH (ref 1.005–1.030)
pH: 5 (ref 5.0–8.0)

## 2022-05-12 LAB — TROPONIN I (HIGH SENSITIVITY)
Troponin I (High Sensitivity): 11 ng/L (ref <18)
Troponin I (High Sensitivity): 17 ng/L (ref <18)

## 2022-05-12 LAB — LIPASE, BLOOD: Lipase: 30 U/L (ref 11–51)

## 2022-05-12 MED ORDER — CEPHALEXIN 500 MG PO CAPS: 500.0000 mg | ORAL_CAPSULE | Freq: Two times a day (BID) | ORAL | 0 refills | Status: AC

## 2022-05-12 MED ORDER — SODIUM CHLORIDE 0.9 % IV BOLUS
1000.0000 mL | Freq: Once | INTRAVENOUS | Status: AC
  Administered 2022-05-12: 1000 mL via INTRAVENOUS

## 2022-05-12 MED ORDER — SODIUM CHLORIDE 0.9 % IV SOLN
1.0000 g | Freq: Once | INTRAVENOUS | Status: AC
  Administered 2022-05-12: 1 g via INTRAVENOUS
  Filled 2022-05-12: qty 10

## 2022-05-12 MED ORDER — ONDANSETRON HCL 4 MG/2ML IJ SOLN
4.0000 mg | Freq: Once | INTRAMUSCULAR | Status: AC
  Administered 2022-05-12: 4 mg via INTRAVENOUS
  Filled 2022-05-12: qty 2

## 2022-05-12 MED ORDER — IOHEXOL 300 MG/ML  SOLN
80.0000 mL | Freq: Once | INTRAMUSCULAR | Status: AC | PRN
  Administered 2022-05-12: 80 mL via INTRAVENOUS

## 2022-05-12 MED ORDER — ONDANSETRON 4 MG PO TBDP: 4.0000 mg | ORAL_TABLET | Freq: Three times a day (TID) | ORAL | 0 refills | Status: DC | PRN

## 2022-05-12 NOTE — ED Notes (Signed)
Patient said she was able to tolerate the drink and crackers and that it was helping.

## 2022-05-12 NOTE — ED Triage Notes (Signed)
Pt arriving with general weakness over the last couple weeks and N/V/D that began this morning. Pt has Stage 4 liver CA (stable at this time). Pt A&O x4.

## 2022-05-12 NOTE — ED Notes (Signed)
Pt ambulated to restroom with assistance pt was a little unsteady but did okay

## 2022-05-12 NOTE — ED Provider Notes (Signed)
[facility] DEPT Provider Note   CSN: [id number] Arrival date & time: 05/12/22  1419     History  Chief Complaint  Patient presents with   Abdominal Pain   Emesis    Cindy Byrd is a 81 y.o. female.  HPI Patient with history of metastatic esophageal and lung cancer presents with nausea, vomiting, fatigue, abdominal pain.  She notes that she has been doing poorly for possibly few weeks, but had no actual vomiting or acute change until today.  Now, over the course of the day she has had multiple episodes of vomiting.  She has pain diffusely in the upper abdomen.  No new dyspnea, no chest pain.  She is accompanied by a female companion who assists with the history.    Home Medications Prior to Admission medications   Medication Sig Start Date End Date Taking? Authorizing Provider  cephALEXin (KEFLEX) 500 MG capsule Take 1 capsule (500 mg total) by mouth 2 (two) times daily for 5 days. 05/12/22 05/17/22 Yes Carmin Muskrat, MD  ondansetron (ZOFRAN-ODT) 4 MG disintegrating tablet Take 1 tablet (4 mg total) by mouth every 8 (eight) hours as needed for nausea or vomiting. 05/12/22  Yes Carmin Muskrat, MD  Biotin 5 MG CAPS Take 5 mg by mouth daily.    [provider]  buPROPion (WELLBUTRIN XL) 150 MG 24 hr tablet Take 1 tablet (150 mg total) by mouth daily. 04/25/22   [name], PA-C  Calcium Citrate-Vitamin D (CALCIUM + D PO) Take 1 tablet by mouth daily.    [provider]  Cyanocobalamin (B-12) 2500 MCG TABS Take 2,500 mcg by mouth daily.    [provider]  doxycycline (VIBRA-TABS) 100 MG tablet Take 1 tablet (100 mg total) by mouth 2 (two) times daily. 05/05/22   [name], PA-C  escitalopram (LEXAPRO) 20 MG tablet TAKE 1 TABLET BY MOUTH EVERY DAY 02/28/22   [name], PA-C  Fluticasone-Umeclidin-Vilant (TRELEGY ELLIPTA) 200-62.5-25 MCG/ACT AEPB Inhale 1 puff into the lungs daily. 04/25/22   [name], PA-C   HYDROcodone-acetaminophen (NORCO) 5-325 MG tablet Take 1 tablet by mouth every 6 (six) hours as needed for moderate pain or severe pain. 01/04/22   [name], MD  nystatin (MYCOSTATIN) 100000 UNIT/ML suspension Take 5 mLs (500,000 Units total) by mouth 4 (four) times daily. 06/04/21   [name], MD  omeprazole (PRILOSEC) 40 MG capsule TAKE 1 CAPSULE BY MOUTH EVERY DAY BEFORE BREAKFAST 02/28/22   [name], MD  predniSONE (DELTASONE) 20 MG tablet Take 2 tablets by mouth x 2 days, 1 tablets x 2 days, 0.5 tablet x 2 days 05/05/22   [name], PA-C  traZODone (DESYREL) 50 MG tablet TAKE 1 TABLET BY MOUTH EVERYDAY AT BEDTIME 12/27/21   [name], MD  triamcinolone cream (KENALOG) 0.1 % APPLY TO AFFECTED AREA TWICE A DAY 01/24/22   Abonza, Maritza, PA-C  XARELTO 20 MG TABS tablet TAKE 1 TABLET BY MOUTH DAILY WITH SUPPER 02/28/22   [name], MD      Allergies    Penicillins, Remeron [mirtazapine], and Latex    Review of Systems   Review of Systems  All other systems reviewed and are negative.   Physical Exam Updated Vital Signs BP (!) 160/72   Pulse 84   Temp 97.7 F (36.5 C) (Oral)   Resp (!) 21   Ht 5\' 8"  (1.727 m)   Wt 69.9 kg   SpO2 98%  BMI 23.42 kg/m  Physical Exam Vitals and nursing note reviewed.  Constitutional:      General: She is not in acute distress.    Appearance: She is well-developed.  HENT:     Head: Normocephalic and atraumatic.  Eyes:     Conjunctiva/sclera: Conjunctivae normal.  Cardiovascular:     Rate and Rhythm: Normal rate and regular rhythm.  Pulmonary:     Effort: Pulmonary effort is normal. No respiratory distress.     Breath sounds: Normal breath sounds. No stridor.  Abdominal:     General: There is no distension.     Tenderness: There is abdominal tenderness in the epigastric area.  Skin:    General: Skin is warm and dry.  Neurological:     Mental Status: She is alert and oriented to  person, place, and time.     Cranial Nerves: No cranial nerve deficit.  Psychiatric:        Mood and Affect: Mood normal.     ED Results / Procedures / Treatments   Labs (all labs ordered are listed, but only abnormal results are displayed) Labs Reviewed  COMPREHENSIVE METABOLIC PANEL - Abnormal; Notable for the following components:      Result Value   CO2 21 (*)    Glucose, Bld 186 (*)    BUN 37 (*)    Creatinine, Ser 1.52 (*)    Total Protein 8.2 (*)    AST 43 (*)    GFR, Estimated 34 (*)    All other components within normal limits  CBC - Abnormal; Notable for the following components:   WBC 23.1 (*)    RBC 5.28 (*)    Hemoglobin 15.7 (*)    HCT 49.1 (*)    Platelets 532 (*)    All other components within normal limits  URINALYSIS, ROUTINE W REFLEX MICROSCOPIC - Abnormal; Notable for the following components:   Specific Gravity, Urine 1.039 (*)    Hgb urine dipstick MODERATE (*)    Nitrite POSITIVE (*)    Leukocytes,Ua TRACE (*)    Bacteria, UA RARE (*)    All other components within normal limits  LIPASE, BLOOD  TROPONIN I (HIGH SENSITIVITY)  TROPONIN I (HIGH SENSITIVITY)    EKG EKG Interpretation  Date/Time:  Thursday May 12 2022 14:40:11 EDT Ventricular Rate:  53 PR Interval:  52 QRS Duration: 85 QT Interval:  443 QTC Calculation: 416 R Axis:   50 Text Interpretation: Sinus rhythm Atrial premature complexes in couplets Short PR interval Abnormal ECG Confirmed by Carmin Muskrat [phone]) on 05/12/2022 3:56:09 PM  Radiology CT Abdomen Pelvis W Contrast  Result Date: 05/12/2022 CLINICAL DATA:  Nausea/vomiting Bowel obstruction suspected EXAM: CT ABDOMEN AND PELVIS WITH CONTRAST TECHNIQUE: Multidetector CT imaging of the abdomen and pelvis was performed using the standard protocol following bolus administration of intravenous contrast. RADIATION DOSE REDUCTION: This exam was performed according to the departmental dose-optimization program which includes  automated exposure control, adjustment of the mA and/or kV according to patient size and/or use of iterative reconstruction technique. CONTRAST:  75mL OMNIPAQUE IOHEXOL 300 MG/ML  SOLN COMPARISON:  None Available. FINDINGS: Lower chest: There is some pleuropulmonary scarring seen at the lung bases greater on the left. Hepatobiliary: There are multiple scattered hypodense lesions consistent with cysts seen with a maximum cyst measuring 1.3 cm in the medial segment of the left lobe of the liver, stable. Biliary sludge at the dependent gallbladder without cholecystitis. Pancreas: Unremarkable. No pancreatic ductal dilatation or surrounding  inflammatory changes. Spleen: Normal in size without focal abnormality. Adrenals/Urinary Tract: Adrenal glands are unremarkable. Kidneys are normal, without renal calculi, focal lesion, or hydronephrosis. Bladder is unremarkable. Stomach/Bowel: Small hiatal hernia. Stomach is within normal limits. Appendix appears normal. No evidence of bowel wall thickening, distention, or inflammatory changes. There is prominent fatty type ileocecal valve and submucosal fat prominence seen at the ileocecal region, stable. Moderate diverticulosis of the sigmoid colon without diverticulitis. Vascular/Lymphatic: Severe atheromatous calcifications of the abdominal aorta extending into the iliac arteries. Reproductive: Status post hysterectomy. No adnexal masses. Other: No abdominal wall hernia or abnormality. No abdominopelvic ascites. Musculoskeletal: No acute or significant osseous findings. IMPRESSION: Biliary sludge at the dependent gallbladder without cholecystitis. Moderate diverticulosis of the sigmoid colon without diverticulitis. No bowel obstruction. Severe atheromatous calcifications of the abdominal aorta and iliac arteries. Electronically Signed   By: Frazier Richards M.D.   On: 05/12/2022 17:05   DG Chest 2 View  Result Date: 05/12/2022 CLINICAL DATA:  Generalized weakness for the past  few weeks with nausea and vomiting this morning. EXAM: CHEST - 2 VIEW COMPARISON:  Chest x-ray dated May 05, 2022. FINDINGS: Unchanged right chest wall port catheter. The heart size and mediastinal contours are within normal limits. Normal pulmonary vascularity. Similar mild scarring at the lung bases. No focal consolidation, pleural effusion, or pneumothorax. No acute osseous abnormality. IMPRESSION: No active cardiopulmonary disease. Electronically Signed   By: Titus Dubin M.D.   On: 05/12/2022 15:21    Procedures Procedures    Medications Ordered in ED Medications  sodium chloride 0.9 % bolus 1,000 mL (0 mLs Intravenous Stopped 05/12/22 1704)  ondansetron (ZOFRAN) injection 4 mg (4 mg Intravenous Given 05/12/22 1548)  iohexol (OMNIPAQUE) 300 MG/ML solution 80 mL (80 mLs Intravenous Contrast Given 05/12/22 1622)  sodium chloride 0.9 % bolus 1,000 mL (0 mLs Intravenous Stopped 05/12/22 2059)  cefTRIAXone (ROCEPHIN) 1 g in sodium chloride 0.9 % 100 mL IVPB (0 g Intravenous Stopped 05/12/22 2141)    ED Course/ Medical Decision Making/ A&P This patient with a Hx of metastatic esophageal cancer presents to the ED for concern of nausea, vomiting, weakness, abdominal pain, this involves an extensive number of treatment options, and is a complaint that carries with it a high risk of complications and morbidity.    The differential diagnosis includes worsening disease, bowel obstruction, colitis, diverticulitis, gastritis   Social Determinants of Health:  Advanced age, esophageal cancer  Additional history obtained:  Additional history and/or information obtained from female companion, notable for history.  Additional details obtained on chart review from her oncology office notes notable for office visit 2 weeks ago with summary of cancer diagnosis below: DIAGNOSIS:    #1 Metastatic non-small cell lung cancer with bilateral lung nodules and large metastatic lesion in the left Ilium. 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 Also had c diff colitis - treated  After the initial evaluation, orders, including: CT, x-ray, labs were initiated.   Patient placed on Cardiac and Pulse-Oximetry Monitors. The patient was maintained on a cardiac monitor.  The cardiac monitored showed an rhythm of sinus tach, 105 abnormal The patient was also maintained on pulse oximetry. The readings were typically 99% room air normal   On repeat evaluation of the patient improved Patient improved markedly after initial fluid bolus, even more after second, with improvement in skin color.  She had no additional episodes of vomiting Lab Tests:  I personally interpreted  labs.  The pertinent results include: Leukocytosis, elevated BUN, some evidence for dehydration and nitrate, leukocyte positive urine  Imaging Studies ordered:  I independently visualized and interpreted imaging which showed abdominal CT without acute intra-abdominal processes, no new lesion, but the patient did have biliary sludge I agree with the radiologist interpretation  9:54 PM Patient in no distress, awake, alert.  With son, daughter-in-law, friend present we had a lengthy conversation about all findings, as above.  Discussed admission versus close outpatient follow-up and the patient has a strong preference for this latter option.  We discussed explicit return precautions as well, as well as need for ongoing medications, monitoring. Dispostion / Final MDM:  After consideration of the diagnostic results and the patient's response to treatment, this adult female with multiple medical issues including ongoing malignancy therapy, presents with nausea, vomiting.  Patient is initially tachycardic, uncomfortable appearance, but improved markedly with 2 L fluid resuscitation, antiemetics, analgesics.  Findings most consistent with lower urinary tract disease, dehydration, biliary  sludge.  No acute abdomen, no ongoing symptoms after resuscitation.  Final Clinical Impression(s) / ED Diagnoses Final diagnoses:  Bilious vomiting with nausea  Lower urinary tract infectious disease  Epigastric pain    Rx / DC Orders ED Discharge Orders          Ordered    cephALEXin (KEFLEX) 500 MG capsule  2 times daily        05/12/22 2152    ondansetron (ZOFRAN-ODT) 4 MG disintegrating tablet  Every 8 hours PRN        05/12/22 2152              Carmin Muskrat, MD 05/12/22 2155

## 2022-05-12 NOTE — Discharge Instructions (Signed)
As discussed, today's evaluation has demonstrated several abnormalities that require outpatient follow-up.  Please be sure to call your physicians tomorrow to discuss today's emergency department evaluation.    If you do develop new, or concerning changes in your condition do not hesitate to return here for additional evaluation.

## 2022-05-12 NOTE — ED Provider Triage Note (Signed)
Emergency Medicine Provider Triage Evaluation Note  Cindy Byrd , a 81 y.o. female  was evaluated upon arrival to ED.  She has a h/o metastatic lung cancer status post immunotherapy and left iliac bone radiation therapy completed 2017 and distal third esophagus cancer status post chemo radiation therapy. Pt complains of nausea, vomiting, and diarrhea starting this morning.  Prior to this, she has had a sore pain inside of her chest, all over, for about 2 weeks.  She saw her primary care doctor who gave her a course of doxycycline and steroids.  She does not feel like this helped.  New symptoms started acutely.  She has chronic shortness of breath, unchanged.  No fevers.  No known sick contacts.  Review of Systems  Positive: Nausea, vomiting, diarrhea Negative: Fever  Physical Exam  BP (!) 186/100 (BP Location: Left Arm)   Pulse (!) 55   Temp 97.7 F (36.5 C) (Oral)   Resp 16   Ht 5\' 8"  (1.727 m)   Wt 69.9 kg   SpO2 100%   BMI 23.42 kg/m  Gen:   Awake, no distress   Resp:  Normal effort  MSK:   Moves extremities without difficulty  Other:  Patient seems to be tender to palpation in the epigastrium area without rebound or guarding; no lower extremity swelling or clinical signs of DVT  Medical Decision Making  Medically screening exam initiated at 2:47 PM.  Appropriate orders placed.  Cindy Byrd was informed that the remainder of the evaluation will be completed by another provider, this initial triage assessment does not replace that evaluation, and the importance of remaining in the ED until their evaluation is complete.     Carlisle Cater, PA-C 05/12/22 1449

## 2022-05-27 ENCOUNTER — Inpatient Hospital Stay: Payer: Medicare Other | Attending: Hematology

## 2022-05-27 ENCOUNTER — Other Ambulatory Visit: Payer: Self-pay

## 2022-05-27 ENCOUNTER — Inpatient Hospital Stay: Payer: Medicare Other

## 2022-05-27 VITALS — BP 127/66 | HR 100 | Temp 97.7°F | Resp 20

## 2022-05-27 DIAGNOSIS — K521 Toxic gastroenteritis and colitis: Secondary | ICD-10-CM

## 2022-05-27 DIAGNOSIS — K529 Noninfective gastroenteritis and colitis, unspecified: Secondary | ICD-10-CM

## 2022-05-27 DIAGNOSIS — Z7189 Other specified counseling: Secondary | ICD-10-CM

## 2022-05-27 DIAGNOSIS — R197 Diarrhea, unspecified: Secondary | ICD-10-CM

## 2022-05-27 DIAGNOSIS — Z95828 Presence of other vascular implants and grafts: Secondary | ICD-10-CM

## 2022-05-27 DIAGNOSIS — C155 Malignant neoplasm of lower third of esophagus: Secondary | ICD-10-CM | POA: Diagnosis not present

## 2022-05-27 DIAGNOSIS — C7951 Secondary malignant neoplasm of bone: Secondary | ICD-10-CM | POA: Diagnosis not present

## 2022-05-27 DIAGNOSIS — C3490 Malignant neoplasm of unspecified part of unspecified bronchus or lung: Secondary | ICD-10-CM

## 2022-05-27 DIAGNOSIS — C3491 Malignant neoplasm of unspecified part of right bronchus or lung: Secondary | ICD-10-CM

## 2022-05-27 LAB — CBC WITH DIFFERENTIAL (CANCER CENTER ONLY)
Abs Immature Granulocytes: 0.02 10*3/uL (ref 0.00–0.07)
Basophils Absolute: 0.1 10*3/uL (ref 0.0–0.1)
Basophils Relative: 1 %
Eosinophils Absolute: 0.3 10*3/uL (ref 0.0–0.5)
Eosinophils Relative: 3 %
HCT: 42.5 % (ref 36.0–46.0)
Hemoglobin: 13.5 g/dL (ref 12.0–15.0)
Immature Granulocytes: 0 %
Lymphocytes Relative: 21 %
Lymphs Abs: 1.9 10*3/uL (ref 0.7–4.0)
MCH: 29.5 pg (ref 26.0–34.0)
MCHC: 31.8 g/dL (ref 30.0–36.0)
MCV: 92.8 fL (ref 80.0–100.0)
Monocytes Absolute: 0.8 10*3/uL (ref 0.1–1.0)
Monocytes Relative: 9 %
Neutro Abs: 6 10*3/uL (ref 1.7–7.7)
Neutrophils Relative %: 66 %
Platelet Count: 345 10*3/uL (ref 150–400)
RBC: 4.58 MIL/uL (ref 3.87–5.11)
RDW: 14.2 % (ref 11.5–15.5)
WBC Count: 9.2 10*3/uL (ref 4.0–10.5)
nRBC: 0 % (ref 0.0–0.2)

## 2022-05-27 LAB — CMP (CANCER CENTER ONLY)
ALT: 13 U/L (ref 0–44)
AST: 23 U/L (ref 15–41)
Albumin: 3.5 g/dL (ref 3.5–5.0)
Alkaline Phosphatase: 76 U/L (ref 38–126)
Anion gap: 10 (ref 5–15)
BUN: 18 mg/dL (ref 8–23)
CO2: 24 mmol/L (ref 22–32)
Calcium: 10.1 mg/dL (ref 8.9–10.3)
Chloride: 105 mmol/L (ref 98–111)
Creatinine: 1.42 mg/dL — ABNORMAL HIGH (ref 0.44–1.00)
GFR, Estimated: 37 mL/min — ABNORMAL LOW (ref >60)
Glucose, Bld: 161 mg/dL — ABNORMAL HIGH (ref 70–99)
Potassium: 4.2 mmol/L (ref 3.5–5.1)
Sodium: 139 mmol/L (ref 135–145)
Total Bilirubin: 0.2 mg/dL — ABNORMAL LOW (ref 0.3–1.2)
Total Protein: 7.5 g/dL (ref 6.5–8.1)

## 2022-05-27 MED ORDER — DENOSUMAB 120 MG/1.7ML ~~LOC~~ SOLN
120.0000 mg | Freq: Once | SUBCUTANEOUS | Status: AC
  Administered 2022-05-27: 120 mg via SUBCUTANEOUS
  Filled 2022-05-27: qty 1.7

## 2022-05-27 MED ORDER — SODIUM CHLORIDE 0.9% FLUSH
10.0000 mL | Freq: Once | INTRAVENOUS | Status: AC
  Administered 2022-05-27: 10 mL

## 2022-05-27 MED ORDER — HEPARIN SOD (PORK) LOCK FLUSH 100 UNIT/ML IV SOLN
500.0000 [IU] | Freq: Once | INTRAVENOUS | Status: AC
  Administered 2022-05-27: 500 [IU]

## 2022-05-27 MED ORDER — OCTREOTIDE ACETATE 30 MG IM KIT
30.0000 mg | PACK | Freq: Once | INTRAMUSCULAR | Status: AC
  Administered 2022-05-27: 30 mg via INTRAMUSCULAR
  Filled 2022-05-27: qty 1

## 2022-05-30 ENCOUNTER — Other Ambulatory Visit: Payer: Self-pay | Admitting: Physician Assistant

## 2022-05-30 DIAGNOSIS — L259 Unspecified contact dermatitis, unspecified cause: Secondary | ICD-10-CM

## 2022-05-30 DIAGNOSIS — F330 Major depressive disorder, recurrent, mild: Secondary | ICD-10-CM

## 2022-05-30 DIAGNOSIS — F339 Major depressive disorder, recurrent, unspecified: Secondary | ICD-10-CM

## 2022-05-31 ENCOUNTER — Other Ambulatory Visit: Payer: Self-pay

## 2022-05-31 DIAGNOSIS — C155 Malignant neoplasm of lower third of esophagus: Secondary | ICD-10-CM

## 2022-05-31 MED ORDER — LIDOCAINE-PRILOCAINE 2.5-2.5 % EX CREA: 1.0000 | TOPICAL_CREAM | CUTANEOUS | 0 refills | Status: DC | PRN

## 2022-06-09 ENCOUNTER — Encounter (HOSPITAL_COMMUNITY): Payer: Self-pay

## 2022-06-09 ENCOUNTER — Emergency Department (HOSPITAL_COMMUNITY)
Admission: EM | Admit: 2022-06-09 | Discharge: 2022-06-09 | Disposition: A | Discharge status: Home / Self Care | Payer: Medicare Other | Attending: Emergency Medicine | Admitting: Emergency Medicine

## 2022-06-09 ENCOUNTER — Emergency Department (HOSPITAL_COMMUNITY): Payer: Medicare Other

## 2022-06-09 DIAGNOSIS — R21 Rash and other nonspecific skin eruption: Secondary | ICD-10-CM | POA: Diagnosis not present

## 2022-06-09 DIAGNOSIS — R519 Headache, unspecified: Secondary | ICD-10-CM | POA: Diagnosis not present

## 2022-06-09 DIAGNOSIS — Z91040 Latex allergy status: Secondary | ICD-10-CM | POA: Diagnosis not present

## 2022-06-09 DIAGNOSIS — Z7901 Long term (current) use of anticoagulants: Secondary | ICD-10-CM | POA: Insufficient documentation

## 2022-06-09 LAB — CBC WITH DIFFERENTIAL/PLATELET
Abs Immature Granulocytes: 0.06 10*3/uL (ref 0.00–0.07)
Basophils Absolute: 0.1 10*3/uL (ref 0.0–0.1)
Basophils Relative: 1 %
Eosinophils Absolute: 0.2 10*3/uL (ref 0.0–0.5)
Eosinophils Relative: 2 %
HCT: 49.1 % — ABNORMAL HIGH (ref 36.0–46.0)
Hemoglobin: 15.7 g/dL — ABNORMAL HIGH (ref 12.0–15.0)
Immature Granulocytes: 1 %
Lymphocytes Relative: 20 %
Lymphs Abs: 2.1 10*3/uL (ref 0.7–4.0)
MCH: 29.7 pg (ref 26.0–34.0)
MCHC: 32 g/dL (ref 30.0–36.0)
MCV: 92.8 fL (ref 80.0–100.0)
Monocytes Absolute: 1.2 10*3/uL — ABNORMAL HIGH (ref 0.1–1.0)
Monocytes Relative: 11 %
Neutro Abs: 6.9 10*3/uL (ref 1.7–7.7)
Neutrophils Relative %: 65 %
Platelets: 287 10*3/uL (ref 150–400)
RBC: 5.29 MIL/uL — ABNORMAL HIGH (ref 3.87–5.11)
RDW: 14.6 % (ref 11.5–15.5)
WBC: 10.5 10*3/uL (ref 4.0–10.5)
nRBC: 0 % (ref 0.0–0.2)

## 2022-06-09 LAB — BASIC METABOLIC PANEL
Anion gap: 11 (ref 5–15)
BUN: 21 mg/dL (ref 8–23)
CO2: 26 mmol/L (ref 22–32)
Calcium: 10.8 mg/dL — ABNORMAL HIGH (ref 8.9–10.3)
Chloride: 101 mmol/L (ref 98–111)
Creatinine, Ser: 1.34 mg/dL — ABNORMAL HIGH (ref 0.44–1.00)
GFR, Estimated: 40 mL/min — ABNORMAL LOW (ref >60)
Glucose, Bld: 124 mg/dL — ABNORMAL HIGH (ref 70–99)
Potassium: 5 mmol/L (ref 3.5–5.1)
Sodium: 138 mmol/L (ref 135–145)

## 2022-06-09 MED ORDER — DIPHENHYDRAMINE HCL 50 MG/ML IJ SOLN
12.5000 mg | Freq: Once | INTRAMUSCULAR | Status: AC
  Administered 2022-06-09: 12.5 mg via INTRAVENOUS
  Filled 2022-06-09: qty 1

## 2022-06-09 MED ORDER — KETOROLAC TROMETHAMINE 15 MG/ML IJ SOLN
15.0000 mg | Freq: Once | INTRAMUSCULAR | Status: AC
  Administered 2022-06-09: 15 mg via INTRAVENOUS
  Filled 2022-06-09: qty 1

## 2022-06-09 MED ORDER — POLYMYXIN B-TRIMETHOPRIM 10000-0.1 UNIT/ML-% OP SOLN: 1.0000 [drp] | OPHTHALMIC | 0 refills | Status: AC

## 2022-06-09 MED ORDER — TETRACAINE HCL 0.5 % OP SOLN
1.0000 [drp] | Freq: Once | OPHTHALMIC | Status: AC
  Administered 2022-06-09: 1 [drp] via OPHTHALMIC
  Filled 2022-06-09: qty 4

## 2022-06-09 MED ORDER — METOCLOPRAMIDE HCL 10 MG PO TABS
10.0000 mg | ORAL_TABLET | Freq: Three times a day (TID) | ORAL | 0 refills | Status: DC | PRN
Start: 2022-06-09 — End: 2022-09-12

## 2022-06-09 MED ORDER — METOCLOPRAMIDE HCL 5 MG/ML IJ SOLN
10.0000 mg | Freq: Once | INTRAMUSCULAR | Status: AC
  Administered 2022-06-09: 10 mg via INTRAVENOUS
  Filled 2022-06-09: qty 2

## 2022-06-09 MED ORDER — BUTALBITAL-APAP-CAFFEINE 50-325-40 MG PO TABS: 1.0000 | ORAL_TABLET | Freq: Four times a day (QID) | ORAL | 0 refills | Status: DC | PRN

## 2022-06-09 MED ORDER — FLUORESCEIN SODIUM 1 MG OP STRP
1.0000 | ORAL_STRIP | Freq: Once | OPHTHALMIC | Status: AC
  Administered 2022-06-09: 1 via OPHTHALMIC
  Filled 2022-06-09: qty 1

## 2022-06-09 NOTE — Discharge Instructions (Addendum)
Recommend taking Tylenol and the prescribed Reglan as needed for nausea or headaches.  For breakthrough pain can try the prescribed fioricet.  I would generally avoid anti-inflammatories such as Motrin or naproxen due to your chronic kidney disease.  If you are having a severe headache again, or if you develop vomiting, any speech change, vision change, neck pain, fever, or other new concerning symptom, please return immediately to ER for reassessment.

## 2022-06-09 NOTE — ED Triage Notes (Signed)
Pt c/o three days of HA without N/V, blurry vision. Pt has papular rash to her R forehead with a distinct line in the mid face area. Pt states that yesterday she attempted to use a TENS device in that area causing the rash. Pt denies unilateral weakness.

## 2022-06-09 NOTE — ED Provider Notes (Signed)
[facility] DEPT Provider Note   CSN: [id number] Arrival date & time: 06/09/22  1503     History  Chief Complaint  Patient presents with   Headache    Cindy Byrd is a 81 y.o. female.  Presented to ER due to concern for headache.  Headache is going on for 2 or 3 days.  It did not start suddenly, slowly been getting worse.  Now is severe however.  No obvious alleviating or aggravating factors.  States that she tried a TENS device without any improvement.  She reports that after she remove the stickers from this TENS device, she broke out in a rash. She specifically recalls the stickers being placed to her right forehead.  She does not have any pain on the skin of her face.  Pain is throughout the entirety of her head.  It is not unilateral.  She denies any neck pain or neck stiffness.  No associated numbness, weakness, speech or vision change.  No change in mental status.  Not on blood thinners.  She has had prior reactions/rashes after starting adhesives.  HPI     Home Medications Prior to Admission medications   Medication Sig Start Date End Date Taking? Authorizing Provider  butalbital-acetaminophen-caffeine (FIORICET) 50-325-40 MG tablet Take 1 tablet by mouth every 6 (six) hours as needed for headache or migraine. 06/09/22 06/09/23 Yes Spence Soberano, Ellwood Dense, MD  metoCLOPramide (REGLAN) 10 MG tablet Take 1 tablet (10 mg total) by mouth every 8 (eight) hours as needed for nausea. 06/09/22  Yes Lucrezia Starch, MD  Biotin 5 MG CAPS Take 5 mg by mouth daily.    [provider]  buPROPion (WELLBUTRIN XL) 150 MG 24 hr tablet Take 1 tablet (150 mg total) by mouth daily. 04/25/22   [name], PA-C  Calcium Citrate-Vitamin D (CALCIUM + D PO) Take 1 tablet by mouth daily.    [provider]  Cyanocobalamin (B-12) 2500 MCG TABS Take 2,500 mcg by mouth daily.    [provider]  doxycycline (VIBRA-TABS) 100 MG tablet Take 1 tablet (100  mg total) by mouth 2 (two) times daily. 05/05/22   [name], PA-C  escitalopram (LEXAPRO) 20 MG tablet TAKE 1 TABLET BY MOUTH EVERY DAY 02/28/22   [name], PA-C  Fluticasone-Umeclidin-Vilant (TRELEGY ELLIPTA) 200-62.5-25 MCG/ACT AEPB Inhale 1 puff into the lungs daily. 04/25/22   [name], PA-C  HYDROcodone-acetaminophen (NORCO) 5-325 MG tablet Take 1 tablet by mouth every 6 (six) hours as needed for moderate pain or severe pain. 01/04/22   [name], MD  lidocaine-prilocaine (EMLA) cream Apply 1 Application topically as needed. 05/31/22   [name], MD  nystatin (MYCOSTATIN) 100000 UNIT/ML suspension Take 5 mLs (500,000 Units total) by mouth 4 (four) times daily. 06/04/21   [name], MD  omeprazole (PRILOSEC) 40 MG capsule TAKE 1 CAPSULE BY MOUTH EVERY DAY BEFORE BREAKFAST 02/28/22   [name], MD  ondansetron (ZOFRAN-ODT) 4 MG disintegrating tablet Take 1 tablet (4 mg total) by mouth every 8 (eight) hours as needed for nausea or vomiting. 05/12/22   Carmin Muskrat, MD  predniSONE (DELTASONE) 20 MG tablet Take 2 tablets by mouth x 2 days, 1 tablets x 2 days, 0.5 tablet x 2 days 05/05/22   [name], PA-C  traZODone (DESYREL) 50 MG tablet TAKE 1 TABLET BY MOUTH EVERYDAY AT BEDTIME 12/27/21   [name], MD  triamcinolone cream (KENALOG) 0.1 % APPLY TO AFFECTED AREA  TWICE A DAY 01/24/22   Abonza, Maritza, PA-C  XARELTO 20 MG TABS tablet TAKE 1 TABLET BY MOUTH DAILY WITH SUPPER 02/28/22   [name], MD      Allergies    Penicillins, Remeron [mirtazapine], and Latex    Review of Systems   Review of Systems  Constitutional:  Negative for chills and fever.  HENT:  Negative for ear pain and sore throat.   Eyes:  Negative for pain and visual disturbance.  Respiratory:  Negative for cough and shortness of breath.   Cardiovascular:  Negative for chest pain and palpitations.  Gastrointestinal:  Negative for abdominal pain  and vomiting.  Genitourinary:  Negative for dysuria and hematuria.  Musculoskeletal:  Negative for arthralgias and back pain.  Skin:  Positive for rash. Negative for color change.  Neurological:  Positive for headaches. Negative for seizures and syncope.  All other systems reviewed and are negative.   Physical Exam Updated Vital Signs BP (!) 161/90   Pulse 80   Temp 97.7 F (36.5 C) (Oral)   Resp 18   Ht 5\' 8"  (1.727 m)   Wt 68 kg   SpO2 99%   BMI 22.81 kg/m  Physical Exam Vitals and nursing note reviewed.  Constitutional:      General: She is not in acute distress.    Appearance: She is well-developed.  HENT:     Head: Normocephalic and atraumatic.     Comments: There is erythematous rash to her right forehead, blanchable, does not extend to her nose at all, relatively uniform  No tenderness to the temporal regions Eyes:     Conjunctiva/sclera: Conjunctivae normal.     Comments: Both eyes appear normal.  On fluorescein stain, no dendritic lesions or pseudo dendrites appreciated.  There was a small area concerning for possible small corneal abrasion to the superior portion of her right eye  Cardiovascular:     Rate and Rhythm: Normal rate and regular rhythm.     Heart sounds: No murmur heard. Pulmonary:     Effort: Pulmonary effort is normal. No respiratory distress.     Breath sounds: Normal breath sounds.  Abdominal:     Palpations: Abdomen is soft.     Tenderness: There is no abdominal tenderness.  Musculoskeletal:        General: No swelling.     Cervical back: Neck supple.  Skin:    General: Skin is warm and dry.     Capillary Refill: Capillary refill takes less than 2 seconds.  Neurological:     Mental Status: She is alert.     Comments: AAOx3 CN 2-12 intact, speech clear visual fields intact 5/5 strength in b/l UE and LE Sensation to light touch intact in b/l UE and LE Normal FNF Normal gait  Psychiatric:        Mood and Affect: Mood normal.     ED  Results / Procedures / Treatments   Labs (all labs ordered are listed, but only abnormal results are displayed) Labs Reviewed  CBC WITH DIFFERENTIAL/PLATELET - Abnormal; Notable for the following components:      Result Value   RBC 5.29 (*)    Hemoglobin 15.7 (*)    HCT 49.1 (*)    Monocytes Absolute 1.2 (*)    All other components within normal limits  BASIC METABOLIC PANEL - Abnormal; Notable for the following components:   Glucose, Bld 124 (*)    Creatinine, Ser 1.34 (*)    Calcium 10.8 (*)  GFR, Estimated 40 (*)    All other components within normal limits  URINALYSIS, ROUTINE W REFLEX MICROSCOPIC    EKG None  Radiology CT Head Wo Contrast  Result Date: 06/09/2022 CLINICAL DATA:  Headache, new or worsening (Age >= 50y) EXAM: CT HEAD WITHOUT CONTRAST TECHNIQUE: Contiguous axial images were obtained from the base of the skull through the vertex without intravenous contrast. RADIATION DOSE REDUCTION: This exam was performed according to the departmental dose-optimization program which includes automated exposure control, adjustment of the mA and/or kV according to patient size and/or use of iterative reconstruction technique. COMPARISON:  Head CT 06/23/2017 FINDINGS: Brain: No evidence of acute intracranial hemorrhage. Unchanged prominent CSF spaces.Patent basal cisterns. No midline shift.The ventricles are unchanged in size.Scattered subcortical and periventricular white matter hypodensities, nonspecific but likely sequela of chronic small vessel ischemic disease.Mild cerebral atrophy Vascular: No hyperdense vessel. Skull: Negative for skull fracture. Sinuses/Orbits: Mild ethmoid air cell mucosal thickening. The orbits are unremarkable. Other: None. IMPRESSION: No acute intracranial abnormality. Stable atrophy and mild sequela of chronic small vessel ischemic disease. Electronically Signed   By: Maurine Simmering M.D.   On: 06/09/2022 16:25    Procedures Procedures    Medications Ordered  in ED Medications  fluorescein ophthalmic strip 1 strip (has no administration in time range)  tetracaine (PONTOCAINE) 0.5 % ophthalmic solution 1 drop (has no administration in time range)  metoCLOPramide (REGLAN) injection 10 mg (10 mg Intravenous Given 06/09/22 2028)  diphenhydrAMINE (BENADRYL) injection 12.5 mg (12.5 mg Intravenous Given 06/09/22 2027)  ketorolac (TORADOL) 15 MG/ML injection 15 mg (15 mg Intravenous Given 06/09/22 2105)    ED Course/ Medical Decision Making/ A&P                           Medical Decision Making Amount and/or Complexity of Data Reviewed Labs: ordered.  Risk Prescription drug management.   81 year old lady presents to ER due to concern for severe headache.  She has no associated neurologic symptoms and headache was not sudden onset.  Given severity, CT head was obtained.  I independently reviewed and interpreted CT imaging, no acute findings noted, no bleeding or mass noted.  She has no tenderness over her temporal regions, no visual complaint, doubt GCA.  I did appreciate rash to the right part of her forehead.  The rash was in part of the dermatome for V1 however notably she had no involvement of the nose and I performed a fluorscein stain and I did not appreciate any sort of dendrite/pseudodentrite type lesions. In reviewing this "TENS" device that patient used, I did find reports of similar rashes suspected to be a contact dermatitis. Also of note - patient has no localization of pain to the right side of her forehead where the rash. Given all this I do not suspect zoster and suspect much more likely this is a mild skin reaction from the adhesive used from this device. Incidentally, I did note a possible small corneal abrasion. Patient denied any trauma. Gave her course of abx drops and advised her to f/u with her ophthalmologist, Dr. Gershon Crane for close f/u.   Now regarding her headache, she did not respond to the Reglan and Benadryl and she was given a dose of  Toradol.  She had marked improvement after receiving this medication, near complete resolution of her symptoms.  Patient now feels very well, continues to deny any sort of neurologic complaint.  Given resolution of her symptoms and  persistently normal neurologic exam, feel she can be discharged and managed in the outpatient setting from a headache perspective.  Basic labs were reviewed, noted creatinine at baseline at 1.3.  I did advise patient to avoid NSAIDs going forward given this creatinine level.  Gave short Rx of Fioricet for breakthrough headaches.  I had very lengthy conversation with patient and her family at bedside regarding strict return precautions, discussed limitations of CT.  Family demonstrated good understanding of return precautions.  Further recommended close recheck with primary doctor.  After the discussed management above, the patient was determined to be safe for discharge.  The patient was in agreement with this plan and all questions regarding their care were answered.  ED return precautions were discussed and the patient will return to the ED with any significant worsening of condition.         Final Clinical Impression(s) / ED Diagnoses Final diagnoses:  Acute nonintractable headache, unspecified headache type    Rx / DC Orders ED Discharge Orders          Ordered    butalbital-acetaminophen-caffeine (FIORICET) 50-325-40 MG tablet  Every 6 hours PRN        06/09/22 2238    metoCLOPramide (REGLAN) 10 MG tablet  Every 8 hours PRN        06/09/22 2238              Lucrezia Starch, MD 06/10/22 1714

## 2022-06-09 NOTE — ED Provider Triage Note (Signed)
Emergency Medicine Provider Triage Evaluation Note  Cindy Byrd , a 81 y.o. female  was evaluated in triage.  Pt complains of headache.  This started 3 to 4 days ago, has been worsening today.  Associated with a rash that is to the right side of her face which is macular with some vesicular lesions.  States the rash started acutely today, denies any vision changes or blurry vision.  Not having any fevers or chills, no recent antibiotic use.  No chest pain or shortness of breath..  Review of Systems  Per HPI  Physical Exam  BP 116/64 (BP Location: Right Arm)   Pulse 69   Temp 97.7 F (36.5 C) (Oral)   Resp 16   Ht 5\' 8"  (1.727 m)   Wt 68 kg   SpO2 97%   BMI 22.81 kg/m  Gen:   Awake, no distress   Resp:  Normal effort  MSK:   Moves extremities without difficulty  Other:  Cranial nerves III through XII are grossly intact, EOMI.  Vesicular rash to right forehead including right eyelid  Medical Decision Making  Medically screening exam initiated at 3:34 PM.  Appropriate orders placed.  Cindy Byrd was informed that the remainder of the evaluation will be completed by another provider, this initial triage assessment does not replace that evaluation, and the importance of remaining in the ED until their evaluation is complete.     Sherrill Raring, PA-C 06/09/22 1535

## 2022-06-10 DIAGNOSIS — B029 Zoster without complications: Secondary | ICD-10-CM | POA: Diagnosis not present

## 2022-06-13 ENCOUNTER — Telehealth: Payer: Self-pay | Admitting: Physician Assistant

## 2022-06-13 DIAGNOSIS — B0230 Zoster ocular disease, unspecified: Secondary | ICD-10-CM

## 2022-06-13 MED ORDER — GABAPENTIN 100 MG PO CAPS: 100.0000 mg | ORAL_CAPSULE | Freq: Two times a day (BID) | ORAL | 0 refills | Status: DC

## 2022-06-13 NOTE — Telephone Encounter (Signed)
Patient was diagnosed with Shingles on Friday by UC. She now has it in her eyes and her eye doctor told her she needed to call her PCP and ask if there is anything she can take for pain due to the severity of it. Please advise.

## 2022-06-13 NOTE — Telephone Encounter (Signed)
Per [name] she is agreeable to sending Gabapentin 100mg  BID #30. Called patient and left msg with this information. Medication sent to pharmacy. AS,CMA

## 2022-06-17 NOTE — Patient Instructions (Signed)

## 2022-06-20 ENCOUNTER — Encounter: Payer: Self-pay | Admitting: Physician Assistant

## 2022-06-20 ENCOUNTER — Ambulatory Visit (INDEPENDENT_AMBULATORY_CARE_PROVIDER_SITE_OTHER): Payer: Medicare Other | Admitting: Physician Assistant

## 2022-06-20 VITALS — BP 119/74 | HR 100 | Temp 97.7°F | Ht 68.5 in | Wt 145.4 lb

## 2022-06-20 DIAGNOSIS — F411 Generalized anxiety disorder: Secondary | ICD-10-CM

## 2022-06-20 DIAGNOSIS — F330 Major depressive disorder, recurrent, mild: Secondary | ICD-10-CM

## 2022-06-20 DIAGNOSIS — E559 Vitamin D deficiency, unspecified: Secondary | ICD-10-CM

## 2022-06-20 DIAGNOSIS — B0230 Zoster ocular disease, unspecified: Secondary | ICD-10-CM

## 2022-06-20 DIAGNOSIS — J432 Centrilobular emphysema: Secondary | ICD-10-CM | POA: Diagnosis not present

## 2022-06-20 DIAGNOSIS — Z87898 Personal history of other specified conditions: Secondary | ICD-10-CM

## 2022-06-20 DIAGNOSIS — E785 Hyperlipidemia, unspecified: Secondary | ICD-10-CM | POA: Diagnosis not present

## 2022-06-20 MED ORDER — PREGABALIN 75 MG PO CAPS: 75.0000 mg | ORAL_CAPSULE | Freq: Two times a day (BID) | ORAL | 0 refills | Status: DC

## 2022-06-20 MED ORDER — DESONIDE 0.05 % EX OINT: 1.0000 | TOPICAL_OINTMENT | Freq: Every day | CUTANEOUS | 0 refills | Status: DC | PRN

## 2022-06-20 NOTE — Assessment & Plan Note (Signed)
-  GAD-7 score has improved some from last score. Will continue current medication regimen. Will reassess mood and treatment therapy in 4 weeks.

## 2022-06-20 NOTE — Assessment & Plan Note (Signed)
-  Relatively unchanged. No SI/HI.  Will continue current medication regimen. Will reassess mood and treatment therapy in 4 weeks.

## 2022-06-20 NOTE — Assessment & Plan Note (Signed)
-  Improved. Continue current medication regimen. Will continue to monitor.

## 2022-06-20 NOTE — Progress Notes (Signed)
Established patient visit   Patient: Cindy Byrd   DOB: 09/03/1941   81 y.o. Female  MRN: [id number] Visit Date: 06/20/2022  Chief Complaint  Patient presents with   Herpes Zoster   Subjective    HPI  Patient presents for chronic follow-up. Patient has been treated for shingles. Reports saw her eye doctor and was given steroid eye drops. Reports gabapentin has not helped with the pain. Has washed rash with soap and water which she saw on goggle.   Mood: Patient reports stressed with shingles outbreak. Cannot tell if new medication is helping or not with everything she has going on. Continues with Lexapro 20 mg and Wellbutrin XL 150 mg.   Emphysema: Reports breathing has improved some with Trelegy. Using inhaler as directed.         06/20/2022    9:59 AM 05/05/2022    4:27 PM 04/25/2022    2:00 PM 01/24/2022    1:49 PM 11/26/2021    8:39 AM  Depression screen PHQ 2/9  Decreased Interest 0 0 _0 Down, Depressed, Hopeless 1 0 _1 PHQ - 2 Score 1 0 _2 Altered sleeping _3 Tired, decreased energy _4 Change in appetite 2 2 0 0 1  Feeling bad or failure about yourself  0 0 _5 Trouble concentrating _6 Moving slowly or fidgety/restless 0 1 0 0 0  Suicidal thoughts 0 0 0 0 0  PHQ-9 Score _7 Difficult doing work/chores Not difficult at all Extremely dIfficult Somewhat difficult Not difficult at all Not difficult at all      06/20/2022   10:00 AM 05/05/2022    4:35 PM 04/25/2022    1:52 PM 01/24/2022    1:49 PM  GAD 7 : Generalized Anxiety Score  Nervous, Anxious, on Edge _8 Control/stop worrying _9 Worry too much - different things _10 Trouble relaxing _11 Restless _12 Easily annoyed or irritable _13 Afraid - awful might happen 0 3 0 1  Total GAD 7 Score _14 Anxiety Difficulty Somewhat difficult  Somewhat difficult Somewhat difficult        Medications: Outpatient Medications Prior to  Visit  Medication Sig   Biotin 5 MG CAPS Take 5 mg by mouth daily.   buPROPion (WELLBUTRIN XL) 150 MG 24 hr tablet Take 1 tablet (150 mg total) by mouth daily.   butalbital-acetaminophen-caffeine (FIORICET) 50-325-40 MG tablet Take 1 tablet by mouth every 6 (six) hours as needed for headache or migraine.   Calcium Citrate-Vitamin D (CALCIUM + D PO) Take 1 tablet by mouth daily.   Cyanocobalamin (B-12) 2500 MCG TABS Take 2,500 mcg by mouth daily.   escitalopram (LEXAPRO) 20 MG tablet TAKE 1 TABLET BY MOUTH EVERY DAY   Fluticasone-Umeclidin-Vilant (TRELEGY ELLIPTA) 200-62.5-25 MCG/ACT AEPB Inhale 1 puff into the lungs daily.   HYDROcodone-acetaminophen (NORCO) 5-325 MG tablet Take 1 tablet by mouth every 6 (six) hours as needed for moderate pain or severe pain.   lidocaine-prilocaine (EMLA) cream Apply 1 Application topically as needed.   metoCLOPramide (REGLAN) 10 MG tablet Take 1 tablet (10 mg total) by mouth every 8 (eight) hours as needed for nausea.   nystatin (MYCOSTATIN) 100000 UNIT/ML  suspension Take 5 mLs (500,000 Units total) by mouth 4 (four) times daily.   omeprazole (PRILOSEC) 40 MG capsule TAKE 1 CAPSULE BY MOUTH EVERY DAY BEFORE BREAKFAST   ondansetron (ZOFRAN-ODT) 4 MG disintegrating tablet Take 1 tablet (4 mg total) by mouth every 8 (eight) hours as needed for nausea or vomiting.   predniSONE (DELTASONE) 20 MG tablet Take 2 tablets by mouth x 2 days, 1 tablets x 2 days, 0.5 tablet x 2 days   traZODone (DESYREL) 50 MG tablet TAKE 1 TABLET BY MOUTH EVERYDAY AT BEDTIME   triamcinolone cream (KENALOG) 0.1 % APPLY TO AFFECTED AREA TWICE A DAY   XARELTO 20 MG TABS tablet TAKE 1 TABLET BY MOUTH DAILY WITH SUPPER   [DISCONTINUED] doxycycline (VIBRA-TABS) 100 MG tablet Take 1 tablet (100 mg total) by mouth 2 (two) times daily.   [DISCONTINUED] gabapentin (NEURONTIN) 100 MG capsule Take 1 capsule (100 mg total) by mouth 2 (two) times daily.   No facility-administered medications prior to  visit.    Review of Systems Review of Systems:  A fourteen system review of systems was performed and found to be positive as per HPI.  Last CBC Lab Results  Component Value Date   WBC 10.5 06/09/2022   HGB 15.7 (H) 06/09/2022   HCT 49.1 (H) 06/09/2022   MCV 92.8 06/09/2022   MCH 29.7 06/09/2022   RDW 14.6 06/09/2022   PLT 287 36/46/8032   Last metabolic panel Lab Results  Component Value Date   GLUCOSE 124 (H) 06/09/2022   NA 138 06/09/2022   K 5.0 06/09/2022   CL 101 06/09/2022   CO2 26 06/09/2022   BUN 21 06/09/2022   CREATININE 1.34 (H) 06/09/2022   GFRNONAA 40 (L) 06/09/2022   CALCIUM 10.8 (H) 06/09/2022   PHOS 4.5 05/01/2019   PROT 7.5 05/27/2022   ALBUMIN 3.5 05/27/2022   LABGLOB 3.3 11/26/2021   AGRATIO 1.2 11/26/2021   BILITOT 0.2 (L) 05/27/2022   ALKPHOS 76 05/27/2022   AST 23 05/27/2022   ALT 13 05/27/2022   ANIONGAP 11 06/09/2022   Last lipids Lab Results  Component Value Date   CHOL 171 11/26/2021   HDL 44 11/26/2021   LDLCALC 104 (H) 11/26/2021   TRIG 127 11/26/2021   CHOLHDL 3.9 11/26/2021   Last hemoglobin A1c Lab Results  Component Value Date   HGBA1C 6.6 (H) 11/26/2021   Last thyroid functions Lab Results  Component Value Date   TSH 2.740 12/13/2019   T3TOTAL 118 04/16/2018     Objective    BP 119/74   Pulse 100   Temp 97.7 F (36.5 C)   Ht 5' 8.5" (1.74 m)   Wt 145 lb 6.4 oz (66 kg)   SpO2 94%   BMI 21.79 kg/m  BP Readings from Last 3 Encounters:  06/20/22 119/74  06/09/22 (!) 174/83  05/27/22 127/66   Wt Readings from Last 3 Encounters:  06/20/22 145 lb 6.4 oz (66 kg)  06/09/22 150 lb (68 kg)  05/12/22 154 lb (69.9 kg)    Physical Exam  General:  Well Developed, well nourished, appropriate for stated age.  Neuro:  Alert and oriented,  extra-ocular muscles intact  HEENT:  Normocephalic, atraumatic, neck supple  Skin: crusted lesions with erythema at right side of face including eye Cardiac:  RRR, S1  S2 Respiratory: CTA B/L  Vascular:  Ext warm, no cyanosis apprec.; cap RF less 2 sec. Psych:  No HI/SI, judgement and insight good, Euthymic mood. Full  Affect.   No results found for any visits on 06/20/22.  Assessment & Plan      Problem List Items Addressed This Visit       Respiratory   Centrilobular emphysema ([place])    -Improved. Continue current medication regimen. Will continue to monitor.        Other   Hyperlipidemia (Chronic)   Relevant Orders   CBC w/Diff   Comp Met (CMET)   Lipid Profile   Depression    -Relatively unchanged. No SI/HI.  Will continue current medication regimen. Will reassess mood and treatment therapy in 4 weeks.      Vitamin D deficiency   Relevant Orders   Vitamin D (25 hydroxy)   GAD (generalized anxiety disorder)    -GAD-7 score has improved some from last score. Will continue current medication regimen. Will reassess mood and treatment therapy in 4 weeks.       Other Visit Diagnoses     Herpes zoster with ophthalmic complication, unspecified herpes zoster eye disease    -  Primary   Relevant Medications   desonide (DESOWEN) 0.05 % ointment   pregabalin (LYRICA) 75 MG capsule   History of prediabetes       Relevant Orders   CBC w/Diff   Comp Met (CMET)   HgB A1c      Herpes zoster with ophthalmic complication, unspecified herpes zoster eye disease: -Patient has completed Valtrex therapy, confirmed with [facility] [address] [place]. Will change Gabapentin to Lyrica 75 mg BID. Discussed to avoid touching her rash and removing scabs from lesions. Will send low potency topical steroid to apply as needed. Continue to follow-up with Dr. Gershon Crane.   Estimated Creatinine Clearance: 33.3 mL/min (A) (by C-G formula based on SCr of 1.34 mg/dL (H)).   Patient is fasting so will collect routine fasting labs.   Return in about 4 weeks (around 07/18/2022) for Shingles, Mood.        [name], PA-C  [facility] [phone] (phone) [phone] (fax)  [place]

## 2022-06-21 LAB — COMPREHENSIVE METABOLIC PANEL
ALT: 26 IU/L (ref 0–32)
AST: 39 IU/L (ref 0–40)
Albumin/Globulin Ratio: 1.1 — ABNORMAL LOW (ref 1.2–2.2)
Albumin: 4.4 g/dL (ref 3.7–4.7)
Alkaline Phosphatase: 98 IU/L (ref 44–121)
BUN/Creatinine Ratio: 20 (ref 12–28)
BUN: 27 mg/dL (ref 8–27)
Bilirubin Total: 0.4 mg/dL (ref 0.0–1.2)
CO2: 18 mmol/L — ABNORMAL LOW (ref 20–29)
Calcium: 11.1 mg/dL — ABNORMAL HIGH (ref 8.7–10.3)
Chloride: 106 mmol/L (ref 96–106)
Creatinine, Ser: 1.33 mg/dL — ABNORMAL HIGH (ref 0.57–1.00)
Globulin, Total: 4.1 g/dL (ref 1.5–4.5)
Glucose: 134 mg/dL — ABNORMAL HIGH (ref 70–99)
Potassium: 5 mmol/L (ref 3.5–5.2)
Sodium: 140 mmol/L (ref 134–144)
Total Protein: 8.5 g/dL (ref 6.0–8.5)
eGFR: 40 mL/min/{1.73_m2} — ABNORMAL LOW (ref >59)

## 2022-06-21 LAB — CBC WITH DIFFERENTIAL/PLATELET
Basophils Absolute: 0.1 10*3/uL (ref 0.0–0.2)
Basos: 1 %
EOS (ABSOLUTE): 0.2 10*3/uL (ref 0.0–0.4)
Eos: 2 %
Hematocrit: 44.6 % (ref 34.0–46.6)
Hemoglobin: 15 g/dL (ref 11.1–15.9)
Immature Grans (Abs): 0.1 10*3/uL (ref 0.0–0.1)
Immature Granulocytes: 1 %
Lymphocytes Absolute: 2.7 10*3/uL (ref 0.7–3.1)
Lymphs: 24 %
MCH: 30.2 pg (ref 26.6–33.0)
MCHC: 33.6 g/dL (ref 31.5–35.7)
MCV: 90 fL (ref 79–97)
Monocytes Absolute: 1 10*3/uL — ABNORMAL HIGH (ref 0.1–0.9)
Monocytes: 9 %
Neutrophils Absolute: 7 10*3/uL (ref 1.4–7.0)
Neutrophils: 63 %
Platelets: 354 10*3/uL (ref 150–450)
RBC: 4.97 x10E6/uL (ref 3.77–5.28)
RDW: 14.5 % (ref 11.7–15.4)
WBC: 11.2 10*3/uL — ABNORMAL HIGH (ref 3.4–10.8)

## 2022-06-21 LAB — LIPID PANEL
Chol/HDL Ratio: 4.8 ratio — ABNORMAL HIGH (ref 0.0–4.4)
Cholesterol, Total: 191 mg/dL (ref 100–199)
HDL: 40 mg/dL (ref >39)
LDL Chol Calc (NIH): 109 mg/dL — ABNORMAL HIGH (ref 0–99)
Triglycerides: 243 mg/dL — ABNORMAL HIGH (ref 0–149)
VLDL Cholesterol Cal: 42 mg/dL — ABNORMAL HIGH (ref 5–40)

## 2022-06-21 LAB — VITAMIN D 25 HYDROXY (VIT D DEFICIENCY, FRACTURES): Vit D, 25-Hydroxy: 35.2 ng/mL (ref 30.0–100.0)

## 2022-06-21 LAB — HEMOGLOBIN A1C
Est. average glucose Bld gHb Est-mCnc: 166 mg/dL
Hgb A1c MFr Bld: 7.4 % — ABNORMAL HIGH (ref 4.8–5.6)

## 2022-06-24 ENCOUNTER — Inpatient Hospital Stay: Payer: Medicare Other

## 2022-07-04 ENCOUNTER — Telehealth: Payer: Self-pay | Admitting: Physician Assistant

## 2022-07-04 ENCOUNTER — Ambulatory Visit (INDEPENDENT_AMBULATORY_CARE_PROVIDER_SITE_OTHER): Payer: Medicare Other | Admitting: Physician Assistant

## 2022-07-04 ENCOUNTER — Encounter: Payer: Self-pay | Admitting: Physician Assistant

## 2022-07-04 VITALS — BP 89/59 | HR 78 | Temp 97.7°F | Ht 68.0 in | Wt 143.0 lb

## 2022-07-04 DIAGNOSIS — L089 Local infection of the skin and subcutaneous tissue, unspecified: Secondary | ICD-10-CM | POA: Diagnosis not present

## 2022-07-04 DIAGNOSIS — B0230 Zoster ocular disease, unspecified: Secondary | ICD-10-CM | POA: Diagnosis not present

## 2022-07-04 DIAGNOSIS — B9689 Other specified bacterial agents as the cause of diseases classified elsewhere: Secondary | ICD-10-CM

## 2022-07-04 MED ORDER — CEFTRIAXONE SODIUM 1 G IJ SOLR
1.0000 g | Freq: Once | INTRAMUSCULAR | Status: AC
  Administered 2022-07-04: 1 g via INTRAMUSCULAR

## 2022-07-04 MED ORDER — VALACYCLOVIR HCL 1 G PO TABS: 1000.0000 mg | ORAL_TABLET | Freq: Three times a day (TID) | ORAL | 0 refills | Status: AC

## 2022-07-04 MED ORDER — KETOROLAC TROMETHAMINE 15 MG/ML IJ SOLN
15.0000 mg | Freq: Once | INTRAMUSCULAR | Status: AC
  Administered 2022-07-04: 15 mg via INTRAMUSCULAR

## 2022-07-04 MED ORDER — TRAMADOL HCL 50 MG PO TABS: 25.0000 mg | ORAL_TABLET | Freq: Three times a day (TID) | ORAL | 0 refills | Status: AC | PRN

## 2022-07-04 NOTE — Telephone Encounter (Signed)
Patient has appointment here for today at 3:50 and will need to sign a new DPR

## 2022-07-04 NOTE — Progress Notes (Signed)
Established patient acute visit   Patient: Cindy Byrd   DOB: January 15, 1941   81 y.o. Female  MRN: [id number] Visit Date: 07/04/2022  Chief Complaint  Patient presents with   Herpes Zoster   Subjective    HPI  Patient presents with worsening shingles rash. Patient is accompanied by her son. Patient was treated with Valtrex around 06/13/22 with a 7 day course at urgent care. Patient is complaining of right eye blurred vision and photosensitivity. Patient reports rash was clearing up but then started to get red lesions and blisters which were itchy and she has been scratching. Now lesions are oozing. Patient 's son reports tried to take her to the ED this past Friday because of how bad the rash started to be but patient refused. Today is worse. Patient reports extreme throbbing and shooting pain. Pain 10/10/.   Medications: Outpatient Medications Prior to Visit  Medication Sig   Biotin 5 MG CAPS Take 5 mg by mouth daily.   buPROPion (WELLBUTRIN XL) 150 MG 24 hr tablet Take 1 tablet (150 mg total) by mouth daily.   butalbital-acetaminophen-caffeine (FIORICET) 50-325-40 MG tablet Take 1 tablet by mouth every 6 (six) hours as needed for headache or migraine.   Calcium Citrate-Vitamin D (CALCIUM + D PO) Take 1 tablet by mouth daily.   Cyanocobalamin (B-12) 2500 MCG TABS Take 2,500 mcg by mouth daily.   desonide (DESOWEN) 0.05 % ointment Apply 1 Application topically daily as needed.   escitalopram (LEXAPRO) 20 MG tablet TAKE 1 TABLET BY MOUTH EVERY DAY   Fluticasone-Umeclidin-Vilant (TRELEGY ELLIPTA) 200-62.5-25 MCG/ACT AEPB Inhale 1 puff into the lungs daily.   HYDROcodone-acetaminophen (NORCO) 5-325 MG tablet Take 1 tablet by mouth every 6 (six) hours as needed for moderate pain or severe pain.   lidocaine-prilocaine (EMLA) cream Apply 1 Application topically as needed.   metoCLOPramide (REGLAN) 10 MG tablet Take 1 tablet (10 mg total) by mouth every 8 (eight) hours as needed for nausea.    nystatin (MYCOSTATIN) 100000 UNIT/ML suspension Take 5 mLs (500,000 Units total) by mouth 4 (four) times daily.   omeprazole (PRILOSEC) 40 MG capsule TAKE 1 CAPSULE BY MOUTH EVERY DAY BEFORE BREAKFAST   ondansetron (ZOFRAN-ODT) 4 MG disintegrating tablet Take 1 tablet (4 mg total) by mouth every 8 (eight) hours as needed for nausea or vomiting.   pregabalin (LYRICA) 75 MG capsule Take 1 capsule (75 mg total) by mouth 2 (two) times daily.   traZODone (DESYREL) 50 MG tablet TAKE 1 TABLET BY MOUTH EVERYDAY AT BEDTIME   XARELTO 20 MG TABS tablet TAKE 1 TABLET BY MOUTH DAILY WITH SUPPER   [DISCONTINUED] predniSONE (DELTASONE) 20 MG tablet Take 2 tablets by mouth x 2 days, 1 tablets x 2 days, 0.5 tablet x 2 days   [DISCONTINUED] triamcinolone cream (KENALOG) 0.1 % APPLY TO AFFECTED AREA TWICE A DAY   No facility-administered medications prior to visit.    Review of Systems Review of Systems:  A fourteen system review of systems was performed and found to be positive as per HPI.     Objective    BP (!) 89/59   Pulse 78   Temp 97.7 F (36.5 C)   Ht 5\' 8"  (1.727 m)   Wt 143 lb (64.9 kg)   SpO2 98%   BMI 21.74 kg/m    Physical Exam  General:  ill-appearing, non-diaphoretic, cooperative  Neuro:  Alert and oriented,  extra-ocular muscles intact  HEENT:  Normocephalic, atraumatic, neck supple  Skin:  see uploaded picture in media  Cardiac:  RRR Respiratory: Speaking in full sentences, unlabored. Vascular:  Ext warm, no cyanosis apprec.; cap RF less 2 sec. Psych:  No HI/SI, judgement and insight good, Euthymic mood. Full Affect.   No results found for any visits on 07/04/22.  Assessment & Plan     Patient with recent herpes zoster with ophthalmic complication presenting with worsening rash. Rash is concerning for also bacterial infection likely secondary to scratching, wound culture collected. Will administer ceftriaxone 1 gram in office and Toradol 30 mg (pain relief). Contacted  Dr. Kellie Moor office and scheduled patient a follow-up visit for tomorrow to ensure no ocular involvement. Advised patient to avoid scratching rash. Patient has completed 7-day course of Valtrex 1 gram TID but is immunocompromised so will extend course for another 7 days. Will provide 1 time rx for tramadol 25-50 mg every 8 hrs as needed for moderate-severe pain. Discussed with patient taking her pain medication with food and increasing hydration. Discussed going to the ED if develops fever, confusion or symptoms worsen. Patient's son reports patient has not been taking her medications or eating/drinking. Will also place home health referral for aide services.     Return for shingles/rash f/up wednesday (ok to work-in for afternoon).        [name], PA-C  [facility] at [facility] [phone] (phone) [phone] (fax)  [place]

## 2022-07-04 NOTE — Telephone Encounter (Signed)
Son called back and stated he just spoke with mother and now is having oozing coming from these places. Please advise.

## 2022-07-04 NOTE — Patient Instructions (Signed)
Dr. Gershon Crane appointment 07/05/22 at 155pm [place] appointment 07/08/22 at 8am.   Liquid IV  Recommend going to ED if develops fever, confusion or worsening symptoms.   Follow up with our office Wednesday afternoon with [name], PA-C.   Take pain medication with food.   PUSH FLUIDS.

## 2022-07-04 NOTE — Telephone Encounter (Signed)
There is no DPR on file giving me permission to discuss patient information with patients son.   I would suggest patient to schedule an appointment to discuss issues. If we do not have an appointment available, I would suggest going to an urgent care for evaluation and treatment.

## 2022-07-04 NOTE — Telephone Encounter (Signed)
Patients son called regarding her case of shingles and he said she has a place above her eye that looks absolutely terrible and the medications are not helping. What does he need to do? Please advise. [phone]

## 2022-07-05 DIAGNOSIS — B028 Zoster with other complications: Secondary | ICD-10-CM | POA: Diagnosis not present

## 2022-07-06 ENCOUNTER — Encounter: Payer: Medicare Other | Attending: Internal Medicine | Admitting: Internal Medicine

## 2022-07-06 ENCOUNTER — Ambulatory Visit: Payer: Medicare Other | Admitting: Physician Assistant

## 2022-07-06 DIAGNOSIS — B0230 Zoster ocular disease, unspecified: Secondary | ICD-10-CM | POA: Insufficient documentation

## 2022-07-06 DIAGNOSIS — Z8501 Personal history of malignant neoplasm of esophagus: Secondary | ICD-10-CM | POA: Insufficient documentation

## 2022-07-06 LAB — WOUND CULTURE

## 2022-07-07 ENCOUNTER — Telehealth: Payer: Self-pay | Admitting: Physician Assistant

## 2022-07-07 ENCOUNTER — Other Ambulatory Visit: Payer: Self-pay | Admitting: Physician Assistant

## 2022-07-07 DIAGNOSIS — B9689 Other specified bacterial agents as the cause of diseases classified elsewhere: Secondary | ICD-10-CM

## 2022-07-07 DIAGNOSIS — B0230 Zoster ocular disease, unspecified: Secondary | ICD-10-CM

## 2022-07-07 MED ORDER — PREGABALIN 75 MG PO CAPS: 75.0000 mg | ORAL_CAPSULE | Freq: Two times a day (BID) | ORAL | 0 refills | Status: DC

## 2022-07-07 MED ORDER — DOXYCYCLINE HYCLATE 100 MG PO TABS: 100.0000 mg | ORAL_TABLET | Freq: Two times a day (BID) | ORAL | 0 refills | Status: DC

## 2022-07-07 NOTE — Telephone Encounter (Signed)
A referral to [facility] was sent out on 07/04/22. AS, CMA

## 2022-07-07 NOTE — Telephone Encounter (Signed)
Patient's son is requesting for patient to have some home health assistance. He has called [facility] and they are willing to come out if you order it. Please advise.

## 2022-07-07 NOTE — Telephone Encounter (Signed)
Spoke with patient and Timmothy Sours, patients son and advised they would need to reach out to whichever rehab center they are interested in going to and see what requirements/forms they need completed. Don verbalized understanding. States his mothers wound was looking dryer yesterday but that patient has been putting wet wipes on the wound itself and today it is looking wet. Advised patient to not put wet wipes/compresses on the wound as we are trying to get it to dry up. Per Helyn App sending in Doxycycline 100mg  BID for 10 days to help kill the bacteria on wound. Patient is complaing of pain. Tramadol RX was given Monday. Asked patient if they were taking Lyrica. She only has 2 pills left and requested a refill. Per Helyn App, sent refill to pharmacy.   AS, CMA

## 2022-07-07 NOTE — Telephone Encounter (Signed)
Timmothy Sours (pts son) called and is asking if [name] can have patient admitted to a short term rehab facility to make sure she is eating, taking her medication and keeping up good hygiene while she is going through the shingles issue. Please advise.

## 2022-07-08 ENCOUNTER — Telehealth: Payer: Self-pay | Admitting: Physician Assistant

## 2022-07-08 NOTE — Telephone Encounter (Signed)
Cindy Byrd with [facility] home health called and stated they did not receive a referral on this patient if you can please giver her a call at [phone] or re-fax to [phone].

## 2022-07-08 NOTE — Telephone Encounter (Signed)
Timmothy Sours (pt's son) aware

## 2022-07-11 NOTE — Progress Notes (Signed)
Cindy Byrd ([id number]) Visit Report for 07/06/2022 Chief Complaint Document Details Patient Name: Cindy Byrd Date of Service: 07/06/2022 2:15 PM Medical Record Number: [id number] Patient Account Number: [id number] Date of Birth/Sex: [birth year] (81 y.o. F) Treating RN: Carlene Coria Primary Care Provider: [name] Other Clinician: Referring Provider: [name] Treating Provider/Extender: Yaakov Guthrie in Treatment: 0 Information Obtained from: Patient Chief Complaint 07/06/2022; 4-week history of herpes zoster with ocular involvement Electronic Signature(s) Signed: 07/06/2022 3:03:00 PM By: Kalman Shan DO Entered By: Kalman Shan on 07/06/2022 14:49:23 Cindy Byrd ([id number]) -------------------------------------------------------------------------------- HPI Details Patient Name: Cindy Byrd Date of Service: 07/06/2022 2:15 PM Medical Record Number: [id number] Patient Account Number: [id number] Date of Birth/Sex: 05-09-1941 (81 y.o. F) Treating RN: Carlene Coria Primary Care Provider: [name] Other Clinician: Referring Provider: [name] Treating Provider/Extender: Yaakov Guthrie in Treatment: 0 History of Present Illness HPI Description: 07/06/2022 Cindy Byrd is an 81 year old female with a past medical history of esophageal cancer that presents to the clinic for a 4-week history of complications of herpes zoster. This mainly includes the V1 dermatomal distribution. She was started on Valtrex 2 weeks ago. She is currently taking this. She states that at her primary care physician's office she received an antibiotic injection this past week. She was given tramadol for pain management. She still having some drainage from the affected area however it is mostly crusted over. She has seen her ophthalmologist who has placed her on prednisolone eyedrops. She continues to have pain that is not well  controlled. Electronic Signature(s) Signed: 07/06/2022 3:03:00 PM By: Kalman Shan DO Entered By: Kalman Shan on 07/06/2022 15:02:20 Cindy Byrd ([id number]) -------------------------------------------------------------------------------- Physical Exam Details Patient Name: Cindy Byrd Date of Service: 07/06/2022 2:15 PM Medical Record Number: [id number] Patient Account Number: [id number] Date of Birth/Sex: 27-Oct-1941 (81 y.o. F) Treating RN: Carlene Coria Primary Care Provider: [name] Other Clinician: Referring Provider: [name] Treating Provider/Extender: Yaakov Guthrie in Treatment: 0 Constitutional . Psychiatric . Notes To the right side of the face mostly to the eye, forehead and nose region there is crusting throughout. Patient denies issues with vision. She denies ear pain. Electronic Signature(s) Signed: 07/06/2022 3:03:00 PM By: Kalman Shan DO Entered By: Kalman Shan on 07/06/2022 14:56:25 Cindy Byrd ([id number]) -------------------------------------------------------------------------------- Physician Orders Details Patient Name: Cindy Byrd Date of Service: 07/06/2022 2:15 PM Medical Record Number: [id number] Patient Account Number: [id number] Date of Birth/Sex: 08/31/1941 (81 y.o. 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, unspecified Discharge From [place] patient and son on need for follow up with PCP , verbalizes understanding, stating they are going to see them now Electronic Signature(s) Signed: 07/06/2022 2:53:27 PM By: Carlene Coria RN Signed: 07/06/2022 3:03:00 PM By: Kalman Shan DO Entered By: Carlene Coria on  07/06/2022 14:53:27 Cindy Byrd ([id number]) -------------------------------------------------------------------------------- Problem List Details Patient Name: Cindy Byrd Date of Service: 07/06/2022 2:15 PM Medical Record Number: [id number] Patient Account Number: [id number] Date of Birth/Sex: February 02, 1941 (81 y.o. 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 F) Treating RN: Carlene Coria Primary Care Provider: [name] Other Clinician: Referring Provider: [name] Treating Provider/Extender: Yaakov Guthrie in Treatment: 0 Subjective Chief Complaint Information obtained from Patient 07/06/2022; 4-week history of herpes zoster with ocular involvement History of Present Illness (HPI) 07/06/2022 Cindy Byrd is an 81 year old female with a past medical history of esophageal cancer that presents to the clinic for a 4-week history of complications of herpes zoster. This mainly includes the V1 dermatomal distribution. She was started on Valtrex 2 weeks ago. She is currently taking this. She states that at her primary care physician's office she received an antibiotic injection this past week. She was given tramadol  for pain management. She still having some drainage from the affected area however it is mostly crusted over. She has seen her ophthalmologist who has placed her on prednisolone eyedrops. She continues to have pain that is not well controlled. Patient History Allergies penicillin, Remeron Social History Former smoker, Marital Status - Widowed, Alcohol Use - Never, Drug Use - No History, Caffeine Use - Never. Medical History Cardiovascular Patient has history of Hypertension Objective Constitutional Vitals Time Taken: 2:12 PM, Height: 68 in, Source: Stated, Weight: 145 lbs, Source: Stated, BMI: 22, Temperature: 97.7 F, Pulse: 81 bpm, Respiratory Rate: 18 breaths/min, Blood Pressure: 117/72 mmHg. General Notes: To the right side of the face mostly to the eye, forehead and nose region there is crusting throughout. Patient denies issues with vision. She denies ear pain. Assessment Active Problems ICD-10 Zoster ocular disease, unspecified PRERNA, HAROLD. ([id number]) Unfortunately patient has developed herpes zoster in the V1 distribution. She is on Valtrex. She is on prednisolone eyedrops by her ophthalmologist. No signs of infection on exam. I recommended she use normal saline to the affected area daily to help remove the crusting. Her pain is not currently controlled and I recommended she follow-up with her primary care provider for pain management and further managment of her herpes zoster. Plan Discharge From [facility] Services: Consult Only - Instructed patient and son on need for follow up with PCP , verbalizes understanding, stating they are going to see them now 1. Normal saline daily 2. Follow-up as needed Electronic Signature(s) Signed: 07/06/2022 3:03:00 PM By: Kalman Shan DO Entered By: Kalman Shan on 07/06/2022 15:02:32 Cindy Byrd ([id number]) -------------------------------------------------------------------------------- ROS/PFSH Details Patient Name:  Cindy Byrd Date of Service: 07/06/2022 2:15 PM Medical Record Number: [id number] Patient Account Number: [id number] Date of Birth/Sex: 07/16/1941 (81 y.o. F) Treating RN: Carlene Coria Primary Care Provider: [name] Other Clinician: Referring Provider: [name] Treating Provider/Extender: Yaakov Guthrie in Treatment: 0 Cardiovascular Medical History: Positive for: Hypertension Immunizations Pneumococcal Vaccine: Received Pneumococcal Vaccination: No Implantable Devices None Family and Social History Former smoker; Marital Status - Widowed; Alcohol Use: Never; Drug Use: No History; Caffeine Use: Never Electronic Signature(s) Signed: 07/06/2022 3:03:00 PM By: Kalman Shan DO Signed: 07/11/2022 8:34:39 AM By: Carlene Coria RN Entered By: Carlene Coria on 07/06/2022 14:16:47 Cindy Byrd ([id number]) -------------------------------------------------------------------------------- SuperBill Details Patient Name: Cindy Byrd Date of Service: 07/06/2022 Medical Record Number: [id number] Patient Account Number: [id number] Date of Birth/Sex: 01/24/1941 (81 y.o. 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

## 2022-07-11 NOTE — Progress Notes (Signed)
Cindy Byrd, Cindy Byrd ([id number]) Visit Report for 07/06/2022 Abuse Risk Screen Details Patient Name: Cindy Byrd, Cindy Byrd Date of Service: 07/06/2022 2:15 PM Medical Record Number: [id number] Patient Account Number: [id number] Date of Birth/Sex: October 15, 1941 (81 y.o. F) Treating RN: Carlene Coria Primary Care Hadlea Furuya: [name] Other Clinician: Referring Desiree Daise: [name] Treating Desma Wilkowski/Extender: Yaakov Guthrie in Treatment: 0 Abuse Risk Screen Items Answer ABUSE RISK SCREEN: Has anyone close to you tried to hurt or harm you recentlyo No Do you feel uncomfortable with anyone in your familyo No Has anyone forced you do things that you didnot want to doo No Electronic Signature(s) Signed: 07/11/2022 8:34:39 AM By: Carlene Coria RN Entered By: Carlene Coria on 07/06/2022 14:16:52 Cindy Byrd ([id number]) -------------------------------------------------------------------------------- Activities of Daily Living Details Patient Name: Cindy Byrd Date of Service: 07/06/2022 2:15 PM Medical Record Number: [id number] Patient Account Number: [id number] Date of Birth/Sex: March 07, 1941 (81 y.o. F) Treating RN: Carlene Coria Primary Care Wiatt Mahabir: [name] Other Clinician: Referring Pernell Dikes: [name] Treating Shenise Wolgamott/Extender: Yaakov Guthrie in Treatment: 0 Activities of Daily Living Items Answer Activities of Daily Living (Please select one for each item) Drive Automobile Completely Able Take Medications Completely Able Use Telephone Completely Able Care for Appearance Completely Able Use Toilet Completely Able Bath / Shower Completely Able Dress Self Completely Able Feed Self Completely Able Walk Completely Able Get In / Out Bed Completely Able Housework Completely Able Prepare Meals Completely [place] Completely Able Shop for Self Completely Able Electronic Signature(s) Signed: 07/11/2022 8:34:39 AM By: Carlene Coria RN Entered  By: Carlene Coria on 07/06/2022 14:17:16 Cindy Byrd ([id number]) -------------------------------------------------------------------------------- Education Screening Details Patient Name: Cindy Byrd Date of Service: 07/06/2022 2:15 PM Medical Record Number: [id number] Patient Account Number: [id number] Date of Birth/Sex: 10-19-1941 (81 y.o. F) Treating RN: Carlene Coria Primary Care Nishant Schrecengost: [name] Other Clinician: Referring Avonelle Viveros: [name] Treating Alyson Ki/Extender: Yaakov Guthrie in Treatment: 0 Primary Learner Assessed: Patient Learning Preferences/Education Level/Primary Language Learning Preference: Explanation Highest Education Level: High School Preferred Language: English Cognitive Barrier Language Barrier: No Translator Needed: No Memory Deficit: No Emotional Barrier: No Cultural/Religious Beliefs Affecting Medical Care: No Physical Barrier Impaired Vision: No Impaired Hearing: No Decreased Hand dexterity: No Knowledge/Comprehension Comprehension Level: High Ability to understand written instructions: High Ability to understand verbal instructions: High Motivation Anxiety Level: Anxious Cooperation: Cooperative Education Importance: Acknowledges Need Interest in Health Problems: Asks Questions Perception: Coherent Willingness to Engage in Self-Management High Activities: Readiness to Engage in Self-Management High Activities: Electronic Signature(s) Signed: 07/11/2022 8:34:39 AM By: Carlene Coria RN Entered By: Carlene Coria on 07/06/2022 14:17:41 Cindy Byrd ([id number]) -------------------------------------------------------------------------------- Fall Risk Assessment Details Patient Name: Cindy Byrd Date of Service: 07/06/2022 2:15 PM Medical Record Number: [id number] Patient Account Number: [id number] Date of Birth/Sex: 11/19/1941 (81 y.o. F) Treating RN: Carlene Coria Primary Care Graceanne Guin: [name] Other Clinician: Referring Tamika Shropshire: [name] Treating Britaney Espaillat/Extender: Yaakov Guthrie in Treatment: 0 Fall Risk Assessment Items Have you had 2 or more falls in the last 12 monthso 0 No Have you had any fall that resulted in injury in the last 12 monthso 0 No FALLS RISK SCREEN History of falling - immediate or within 3 months 0 No Secondary diagnosis (Do you have 2 or more medical diagnoseso) 0 No Ambulatory aid None/bed rest/wheelchair/nurse 0 No Crutches/cane/walker 0 No Furniture 0 No Intravenous therapy Access/Saline/Heparin Lock 0 No Gait/Transferring Normal/ bed rest/ wheelchair 0 No Weak (short steps with or without shuffle, stooped  but able to lift head while walking, may 0 No seek support from furniture) Impaired (short steps with shuffle, may have difficulty arising from chair, head down, impaired 0 No balance) Mental Status Oriented to own ability 0 No Electronic Signature(s) Signed: 07/11/2022 8:34:39 AM By: Carlene Coria RN Entered By: Carlene Coria on 07/06/2022 14:17:49 Cindy Byrd ([id number]) -------------------------------------------------------------------------------- Foot Assessment Details Patient Name: Cindy Byrd Date of Service: 07/06/2022 2:15 PM Medical Record Number: [id number] Patient Account Number: [id number] Date of Birth/Sex: 1941-06-13 (81 y.o. F) Treating RN: Carlene Coria Primary Care Chella Chapdelaine: [name] Other Clinician: Referring Damarco Keysor: [name] Treating Genni Buske/Extender: Yaakov Guthrie in Treatment: 0 Foot Assessment Items Site Locations + = Sensation present, - = Sensation absent, C = Callus, U = Ulcer R = Redness, W = Warmth, M = Maceration, PU = Pre-ulcerative lesion F = Fissure, S = Swelling, D = Dryness Assessment Right: Left: Other Deformity: No No Prior Foot Ulcer: No No Prior Amputation: No No Charcot Joint: No No Ambulatory Status: Ambulatory Without Help Gait:  Steady Electronic Signature(s) Signed: 07/11/2022 8:34:39 AM By: Carlene Coria RN Entered By: Carlene Coria on 07/06/2022 14:18:09 Cindy Byrd ([id number]) -------------------------------------------------------------------------------- Nutrition Risk Screening Details Patient Name: Cindy Byrd Date of Service: 07/06/2022 2:15 PM Medical Record Number: [id number] Patient Account Number: [id number] Date of Birth/Sex: 27-May-1941 (81 y.o. F) Treating RN: Carlene Coria Primary Care Sheryl Towell: [name] Other Clinician: Referring Castulo Scarpelli: [name] Treating Kaysa Roulhac/Extender: Yaakov Guthrie in Treatment: 0 Height (in): 68 Weight (lbs): 145 Body Mass Index (BMI): 22 Nutrition Risk Screening Items Score Screening NUTRITION RISK SCREEN: I have an illness or condition that made me change the kind and/or amount of food I eat 0 No I eat fewer than two meals per day 0 No I eat few fruits and vegetables, or milk products 0 No I have three or more drinks of beer, liquor or wine almost every day 0 No I have tooth or mouth problems that make it hard for me to eat 0 No I don't always have enough money to buy the food I need 0 No I eat alone most of the time 0 No I take three or more different prescribed or over-the-counter drugs a day 1 Yes Without wanting to, I have lost or gained 10 pounds in the last six months 0 No I am not always physically able to shop, cook and/or feed myself 0 No Nutrition Protocols Good Risk Protocol Moderate Risk Protocol High Risk Proctocol Risk Level: Good Risk Score: 1 Electronic Signature(s) Signed: 07/11/2022 8:34:39 AM By: Carlene Coria RN Entered By: Carlene Coria on 07/06/2022 14:18:00

## 2022-07-11 NOTE — Progress Notes (Signed)
AMBRI, MILTNER ([id number]) Visit Report for 07/06/2022 Allergy List Details Patient Name: Cindy Byrd, Cindy Byrd Date of Service: 07/06/2022 2:15 PM Medical Record Number: [id number] Patient Account Number: [id number] Date of Birth/Sex: Jan 23, 1941 (81 y.o. F) Treating RN: Carlene Coria Primary Care Kortlyn Koltz: [name] Other Clinician: Referring Kai Railsback: [name] Treating Deryk Bozman/Extender: Yaakov Guthrie in Treatment: 0 Allergies Active Allergies penicillin Remeron Allergy Notes Electronic Signature(s) Signed: 07/11/2022 8:34:39 AM By: Carlene Coria RN Entered By: Carlene Coria on 07/06/2022 14:14:50 Cindy Byrd ([id number]) -------------------------------------------------------------------------------- Arrival Information Details Patient Name: Cindy Byrd Date of Service: 07/06/2022 2:15 PM Medical Record Number: [id number] Patient Account Number: [id number] Date of Birth/Sex: [birth year] (81 y.o. F) Treating RN: Carlene Coria Primary Care Lorrin Nawrot: [name] Other Clinician: Referring Jennings Corado: [name] Treating Yaret Hush/Extender: Yaakov Guthrie in Treatment: 0 Visit Information Patient Arrived: Ambulatory Arrival Time: 14:11 Accompanied By: husband Transfer Assistance: None Patient Identification Verified: Yes Secondary Verification Process Completed: Yes Patient Requires Transmission-Based Precautions: No Patient Has Alerts: No Electronic Signature(s) Signed: 07/11/2022 8:34:39 AM By: Carlene Coria RN Entered By: Carlene Coria on 07/06/2022 14:11:54 Cindy Byrd ([id number]) -------------------------------------------------------------------------------- Clinic Level of Care Assessment Details Patient Name: Cindy Byrd Date of Service: 07/06/2022 2:15 PM Medical Record Number: [id number] Patient Account Number: [id number] Date of Birth/Sex: 03/27/1941 (81 y.o. F) Treating RN: Carlene Coria Primary Care Kenlyn Lose:  [name] Other Clinician: Referring Wendy Mikles: [name] Treating Vashon Riordan/Extender: Yaakov Guthrie in Treatment: 0 Clinic Level of Care Assessment Items TOOL 2 Quantity Score X - Use when only an EandM is performed on the INITIAL visit 1 0 ASSESSMENTS - Nursing Assessment / Reassessment X - General Physical Exam (combine w/ comprehensive assessment (listed just below) when performed on new 1 20 pt. evals) X- 1 25 Comprehensive Assessment (HX, ROS, Risk Assessments, Wounds Hx, etc.) ASSESSMENTS - Wound and Skin Assessment / Reassessment []  - Simple Wound Assessment / Reassessment - one wound 0 []  - 0 Complex Wound Assessment / Reassessment - multiple wounds []  - 0 Dermatologic / Skin Assessment (not related to wound area) ASSESSMENTS - Ostomy and/or Continence Assessment and Care []  - Incontinence Assessment and Management 0 []  - 0 Ostomy Care Assessment and Management (repouching, etc.) PROCESS - Coordination of Care X - Simple Patient / Family Education for ongoing care 1 15 []  - 0 Complex (extensive) Patient / Family Education for ongoing care []  - 0 Staff obtains Programmer, systems, Records, Test Results / Process Orders []  - 0 Staff telephones HHA, Nursing Homes / Clarify orders / etc []  - 0 Routine Transfer to another Facility (non-emergent condition) []  - 0 Routine Hospital Admission (non-emergent condition) X- 1 15 New Admissions / Biomedical engineer / Ordering NPWT, Apligraf, etc. []  - 0 Emergency Hospital Admission (emergent condition) X- 1 10 Simple Discharge Coordination []  - 0 Complex (extensive) Discharge Coordination PROCESS - Special Needs []  - Pediatric / Minor Patient Management 0 []  - 0 Isolation Patient Management []  - 0 Hearing / Language / Visual special needs []  - 0 Assessment of Community assistance (transportation, D/C planning, etc.) []  - 0 Additional assistance / Altered mentation []  - 0 Support Surface(s) Assessment  (bed, cushion, seat, etc.) INTERVENTIONS - Wound Cleansing / Measurement []  - Wound Imaging (photographs - any number of wounds) 0 []  - 0 Wound Tracing (instead of photographs) []  - 0 Simple Wound Measurement - one wound []  - 0 Complex Wound Measurement - multiple wounds SABRIEL, BORROMEO. ([id number]) []  - 0 Simple Wound Cleansing - one  wound []  - 0 Complex Wound Cleansing - multiple wounds INTERVENTIONS - Wound Dressings []  - Small Wound Dressing one or multiple wounds 0 []  - 0 Medium Wound Dressing one or multiple wounds []  - 0 Large Wound Dressing one or multiple wounds []  - 0 Application of Medications - injection INTERVENTIONS - Miscellaneous []  - External ear exam 0 []  - 0 Specimen Collection (cultures, biopsies, blood, body fluids, etc.) []  - 0 Specimen(s) / Culture(s) sent or taken to Lab for analysis []  - 0 Patient Transfer (multiple staff / Civil Service fast streamer / Similar devices) []  - 0 Simple Staple / Suture removal (25 or less) []  - 0 Complex Staple / Suture removal (26 or more) []  - 0 Hypo / Hyperglycemic Management (close monitor of Blood Glucose) []  - 0 Ankle / Brachial Index (ABI) - do not check if billed separately Has the patient been seen at the hospital within the last three years: Yes Total Score: 85 Level Of Care: New/Established - Level 3 Electronic Signature(s) Signed: 07/11/2022 8:34:39 AM By: Carlene Coria RN Entered By: Carlene Coria on 07/06/2022 14:54:31 Cindy Byrd ([id number]) -------------------------------------------------------------------------------- Encounter Discharge Information Details Patient Name: Cindy Byrd Date of Service: 07/06/2022 2:15 PM Medical Record Number: [id number] Patient Account Number: [id number] Date of Birth/Sex: 1941-10-09 (81 y.o. F) Treating RN: Carlene Coria Primary Care Saroya Riccobono: [name] Other Clinician: Referring Hagan Maltz: [name] Treating Babbette Dalesandro/Extender: Yaakov Guthrie in  Treatment: 0 Encounter Discharge Information Items Discharge Condition: Stable Ambulatory Status: Walker Discharge Destination: Home Transportation: Private Auto Accompanied By: son Schedule Follow-up Appointment: Yes Clinical Summary of Care: Electronic Signature(s) Signed: 07/06/2022 2:55:19 PM By: Carlene Coria RN Entered By: Carlene Coria on 07/06/2022 14:55:19 Cindy Byrd ([id number]) -------------------------------------------------------------------------------- Lower Extremity Assessment Details Patient Name: Cindy Byrd Date of Service: 07/06/2022 2:15 PM Medical Record Number: [id number] Patient Account Number: [id number] Date of Birth/Sex: [birth year] (81 y.o. F) Treating RN: Carlene Coria Primary Care Ruthy Forry: [name] Other Clinician: Referring Ahron Hulbert: [name] Treating Keyonte Cookston/Extender: Yaakov Guthrie in Treatment: 0 Electronic Signature(s) Signed: 07/11/2022 8:34:39 AM By: Carlene Coria RN Entered By: Carlene Coria on 07/06/2022 14:14:05 Cindy Byrd ([id number]) -------------------------------------------------------------------------------- Multi Wound Chart Details Patient Name: Cindy Byrd Date of Service: 07/06/2022 2:15 PM Medical Record Number: [id number] Patient Account Number: [id number] Date of Birth/Sex: 07/25/1941 (81 y.o. F) Treating RN: Carlene Coria Primary Care Zlata Alcaide: [name] Other Clinician: Referring Usher Hedberg: [name] Treating Valma Rotenberg/Extender: Yaakov Guthrie in Treatment: 0 Vital Signs Height(in): 68 Pulse(bpm): 81 Weight(lbs): 145 Blood Pressure(mmHg): 117/72 Body Mass Index(BMI): 22 Temperature(F): 97.7 Respiratory Rate(breaths/min): 18 Wound Assessments Treatment Notes Electronic Signature(s) Signed: 07/06/2022 2:52:28 PM By: Carlene Coria RN Entered By: Carlene Coria on 07/06/2022 14:52:28 Cindy Byrd  ([id number]) -------------------------------------------------------------------------------- [place] Details Patient Name: Cindy Byrd Date of Service: 07/06/2022 2:15 PM Medical Record Number: [id number] Patient Account Number: [id number] Date of Birth/Sex: 06/14/1941 (81 y.o. F) Treating RN: Carlene Coria Primary Care Ravleen Ries: [name] Other Clinician: Referring Zinedine Ellner: [name] Treating Zelina Jimerson/Extender: Yaakov Guthrie in Treatment: 0 Active Inactive Electronic Signature(s) Signed: 07/06/2022 2:52:09 PM By: Carlene Coria RN Entered By: Carlene Coria on 07/06/2022 14:52:08 Cindy Byrd ([id number]) -------------------------------------------------------------------------------- Pain Assessment Details Patient Name: Cindy Byrd Date of Service: 07/06/2022 2:15 PM Medical Record Number: [id number] Patient Account Number: [id number] Date of Birth/Sex: May 24, 1941 (81 y.o. F) Treating RN: Carlene Coria Primary Care Anaeli Cornwall: [name] Other Clinician: Referring Devaughn Savant: [name] Treating Smita Lesh/Extender: Yaakov Guthrie in Treatment: 0  Active Problems Location of Pain Severity and Description of Pain Patient Has Paino Yes Site Locations With Dressing Change: Yes Duration of the Pain. Constant / Intermittento Constant Rate the pain. Current Pain Level: 10 Worst Pain Level: 10 Least Pain Level: 10 Character of Pain Describe the Pain: Shooting Pain Management and Medication Current Pain Management: Medication: Yes Cold Application: No Rest: Yes Massage: No Activity: No T.E.N.S.: No Heat Application: No Leg drop or elevation: No Is the Current Pain Management Adequate: Inadequate How does your wound impact your activities of daily livingo Sleep: Yes Bathing: No Appetite: Yes Relationship With Others: No Bladder Continence: No Emotions: No Bowel Continence: No Work: No Toileting: No Drive:  No Dressing: No Hobbies: No Electronic Signature(s) Signed: 07/11/2022 8:34:39 AM By: Carlene Coria RN Entered By: Carlene Coria on 07/06/2022 14:12:48 Cindy Byrd ([id number]) -------------------------------------------------------------------------------- Patient/Caregiver Education Details Patient Name: Cindy Byrd Date of Service: 07/06/2022 2:15 PM Medical Record Number: [id number] Patient Account Number: [id number] Date of Birth/Gender: 1941-03-25 (81 y.o. F) Treating RN: Carlene Coria Primary Care Physician: [name] Other Clinician: Referring Physician: [name] Treating Physician/Extender: Yaakov Guthrie in Treatment: 0 Education Assessment Education Provided To: Patient Education Topics Provided Welcome To [place]: Methods: Explain/Verbal Responses: State content correctly Electronic Signature(s) Signed: 07/11/2022 8:34:39 AM By: Carlene Coria RN Entered By: Carlene Coria on 07/06/2022 14:54:48 Cindy Byrd ([id number]) -------------------------------------------------------------------------------- Vitals Details Patient Name: Cindy Byrd Date of Service: 07/06/2022 2:15 PM Medical Record Number: [id number] Patient Account Number: [id number] Date of Birth/Sex: Dec 12, 1940 (81 y.o. F) Treating RN: Carlene Coria Primary Care Juda Toepfer: [name] Other Clinician: Referring Emersyn Wyss: [name] Treating Chukwuemeka Artola/Extender: Yaakov Guthrie in Treatment: 0 Vital Signs Time Taken: 14:12 Temperature (F): 97.7 Height (in): 68 Pulse (bpm): 81 Source: Stated Respiratory Rate (breaths/min): 18 Weight (lbs): 145 Blood Pressure (mmHg): 117/72 Source: Stated Reference Range: 80 - 120 mg / dl Body Mass Index (BMI): 22 Electronic Signature(s) Signed: 07/11/2022 8:34:39 AM By: Carlene Coria RN Entered By: Carlene Coria on 07/06/2022 14:13:33

## 2022-07-14 NOTE — Telephone Encounter (Signed)
I have been out of the office since Friday, 07/08/22. I will refax the referral now. AS, CMA

## 2022-07-15 ENCOUNTER — Ambulatory Visit (HOSPITAL_COMMUNITY)
Admission: RE | Admit: 2022-07-15 | Discharge: 2022-07-15 | Disposition: A | Discharge status: Home / Self Care | Payer: Medicare Other | Source: Ambulatory Visit | Attending: Hematology | Admitting: Hematology

## 2022-07-15 DIAGNOSIS — C3490 Malignant neoplasm of unspecified part of unspecified bronchus or lung: Secondary | ICD-10-CM | POA: Diagnosis not present

## 2022-07-15 DIAGNOSIS — C3491 Malignant neoplasm of unspecified part of right bronchus or lung: Secondary | ICD-10-CM

## 2022-07-15 DIAGNOSIS — C155 Malignant neoplasm of lower third of esophagus: Secondary | ICD-10-CM | POA: Diagnosis not present

## 2022-07-18 ENCOUNTER — Telehealth: Payer: Self-pay | Admitting: Physician Assistant

## 2022-07-18 NOTE — Telephone Encounter (Signed)
Patient said someone told her about a cream she could get to put on the shingles but she doesn't know the name of it but is asking if you know and can you prescribe it?

## 2022-07-18 NOTE — Telephone Encounter (Signed)
Patient aware.

## 2022-07-19 ENCOUNTER — Ambulatory Visit: Payer: Medicare Other | Admitting: Internal Medicine

## 2022-07-20 ENCOUNTER — Other Ambulatory Visit: Payer: Medicare Other

## 2022-07-22 ENCOUNTER — Other Ambulatory Visit: Payer: Self-pay

## 2022-07-22 ENCOUNTER — Inpatient Hospital Stay: Payer: Medicare Other | Attending: Hematology

## 2022-07-22 ENCOUNTER — Inpatient Hospital Stay: Payer: Medicare Other

## 2022-07-22 ENCOUNTER — Inpatient Hospital Stay (HOSPITAL_BASED_OUTPATIENT_CLINIC_OR_DEPARTMENT_OTHER): Payer: Medicare Other | Admitting: Hematology

## 2022-07-22 DIAGNOSIS — K521 Toxic gastroenteritis and colitis: Secondary | ICD-10-CM | POA: Diagnosis not present

## 2022-07-22 DIAGNOSIS — Z86718 Personal history of other venous thrombosis and embolism: Secondary | ICD-10-CM | POA: Diagnosis not present

## 2022-07-22 DIAGNOSIS — C3490 Malignant neoplasm of unspecified part of unspecified bronchus or lung: Secondary | ICD-10-CM

## 2022-07-22 DIAGNOSIS — Z87891 Personal history of nicotine dependence: Secondary | ICD-10-CM | POA: Diagnosis not present

## 2022-07-22 DIAGNOSIS — B0230 Zoster ocular disease, unspecified: Secondary | ICD-10-CM

## 2022-07-22 DIAGNOSIS — C7951 Secondary malignant neoplasm of bone: Secondary | ICD-10-CM | POA: Insufficient documentation

## 2022-07-22 DIAGNOSIS — Z86711 Personal history of pulmonary embolism: Secondary | ICD-10-CM | POA: Diagnosis not present

## 2022-07-22 DIAGNOSIS — K529 Noninfective gastroenteritis and colitis, unspecified: Secondary | ICD-10-CM

## 2022-07-22 DIAGNOSIS — C155 Malignant neoplasm of lower third of esophagus: Secondary | ICD-10-CM

## 2022-07-22 DIAGNOSIS — Z79899 Other long term (current) drug therapy: Secondary | ICD-10-CM | POA: Insufficient documentation

## 2022-07-22 DIAGNOSIS — Z7901 Long term (current) use of anticoagulants: Secondary | ICD-10-CM | POA: Insufficient documentation

## 2022-07-22 DIAGNOSIS — R197 Diarrhea, unspecified: Secondary | ICD-10-CM

## 2022-07-22 DIAGNOSIS — Z95828 Presence of other vascular implants and grafts: Secondary | ICD-10-CM

## 2022-07-22 DIAGNOSIS — Z7189 Other specified counseling: Secondary | ICD-10-CM

## 2022-07-22 LAB — CMP (CANCER CENTER ONLY)
ALT: 17 U/L (ref 0–44)
AST: 24 U/L (ref 15–41)
Albumin: 3.5 g/dL (ref 3.5–5.0)
Alkaline Phosphatase: 67 U/L (ref 38–126)
Anion gap: 5 (ref 5–15)
BUN: 33 mg/dL — ABNORMAL HIGH (ref 8–23)
CO2: 28 mmol/L (ref 22–32)
Calcium: 9.8 mg/dL (ref 8.9–10.3)
Chloride: 107 mmol/L (ref 98–111)
Creatinine: 1.27 mg/dL — ABNORMAL HIGH (ref 0.44–1.00)
GFR, Estimated: 42 mL/min — ABNORMAL LOW (ref >60)
Glucose, Bld: 167 mg/dL — ABNORMAL HIGH (ref 70–99)
Potassium: 4.3 mmol/L (ref 3.5–5.1)
Sodium: 140 mmol/L (ref 135–145)
Total Bilirubin: 0.4 mg/dL (ref 0.3–1.2)
Total Protein: 7.1 g/dL (ref 6.5–8.1)

## 2022-07-22 LAB — CBC WITH DIFFERENTIAL (CANCER CENTER ONLY)
Abs Immature Granulocytes: 0.06 10*3/uL (ref 0.00–0.07)
Basophils Absolute: 0.1 10*3/uL (ref 0.0–0.1)
Basophils Relative: 1 %
Eosinophils Absolute: 0.2 10*3/uL (ref 0.0–0.5)
Eosinophils Relative: 1 %
HCT: 37.3 % (ref 36.0–46.0)
Hemoglobin: 12.5 g/dL (ref 12.0–15.0)
Immature Granulocytes: 1 %
Lymphocytes Relative: 20 %
Lymphs Abs: 2.4 10*3/uL (ref 0.7–4.0)
MCH: 31.3 pg (ref 26.0–34.0)
MCHC: 33.5 g/dL (ref 30.0–36.0)
MCV: 93.3 fL (ref 80.0–100.0)
Monocytes Absolute: 0.9 10*3/uL (ref 0.1–1.0)
Monocytes Relative: 8 %
Neutro Abs: 8.4 10*3/uL — ABNORMAL HIGH (ref 1.7–7.7)
Neutrophils Relative %: 69 %
Platelet Count: 297 10*3/uL (ref 150–400)
RBC: 4 MIL/uL (ref 3.87–5.11)
RDW: 17.6 % — ABNORMAL HIGH (ref 11.5–15.5)
WBC Count: 12 10*3/uL — ABNORMAL HIGH (ref 4.0–10.5)
nRBC: 0 % (ref 0.0–0.2)

## 2022-07-22 MED ORDER — HEPARIN SOD (PORK) LOCK FLUSH 100 UNIT/ML IV SOLN
500.0000 [IU] | Freq: Once | INTRAVENOUS | Status: AC
  Administered 2022-07-22: 500 [IU]

## 2022-07-22 MED ORDER — SODIUM CHLORIDE 0.9% FLUSH
10.0000 mL | Freq: Once | INTRAVENOUS | Status: AC
  Administered 2022-07-22: 10 mL

## 2022-07-22 MED ORDER — DRONABINOL 2.5 MG PO CAPS: 2.5000 mg | ORAL_CAPSULE | Freq: Two times a day (BID) | ORAL | 0 refills | Status: DC

## 2022-07-22 MED ORDER — VALACYCLOVIR HCL 500 MG PO TABS: 500.0000 mg | ORAL_TABLET | Freq: Two times a day (BID) | ORAL | 2 refills | Status: DC

## 2022-07-22 MED ORDER — PREGABALIN 75 MG PO CAPS: 75.0000 mg | ORAL_CAPSULE | Freq: Two times a day (BID) | ORAL | 2 refills | Status: DC

## 2022-07-25 ENCOUNTER — Other Ambulatory Visit: Payer: Self-pay

## 2022-07-25 DIAGNOSIS — C155 Malignant neoplasm of lower third of esophagus: Secondary | ICD-10-CM

## 2022-07-27 ENCOUNTER — Telehealth: Payer: Self-pay | Admitting: Hematology

## 2022-07-27 NOTE — Telephone Encounter (Signed)
Scheduled follow-up appointment per 8/18 los. Patient is aware.

## 2022-07-28 ENCOUNTER — Encounter: Payer: Self-pay | Admitting: Hematology

## 2022-07-28 NOTE — Progress Notes (Signed)
HEMATOLOGY ONCOLOGY CLINIC VISIT NOTE  Date of service:  07/22/2022   Patient Care Team: Cindy Reid, Cindy Byrd as PCP - General (Physician Assistant) [name], MD as Consulting Physician (Hematology and Oncology)  CHIEF COMPLAINTS/PURPOSE OF CONSULTATION:  Follow-up for continued evaluation and management of metastatic lung cancer Follow-up for esophageal cancer  DIAGNOSIS:   #1 Metastatic non-small cell lung cancer with bilateral lung nodules and large metastatic lesion in the left Ilium. 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 Also had c diff colitis - treated   Current Treatment  1) Active surveillance 2) Xgeva 157m  q12weeks for bone metastases. 3) Sandostatin q4weeks for diarrhea   Previous Treatment  For metastatic lung cancer 1 Palliative radiation therapy to the large left ilium metastases 2. IV Nivolumab x 20 cycles (discontinued due to likely immune colitis) 3. Xgeva 1274mSC q4weeks for bone metastases.  For Esophageal adenocarcinoma S/p Concurrent carbo/taxol + RT  HISTORY OF PRESENTING ILLNESS: (plz see my previous consultation for details of initial presentation)  INTERVAL HISTORY:  Patient is here for continued valuation and management of her metastatic lung cancer and history of visit patient cannot carcinoma. She notes that since her last clinic visit she was seen in urgent care for severe shingles outbreak forehead been fairly painful.  She notes that this has significantly affected her functional status and decreased p.o. intake.  She has had significant fatigue and difficulty with getting around.  Her son is here with her in clinic to assist her with functioning at home.  She is keen to work with therapies to try to get back to an improved level of independence. She was treated with Valtrex and the lesion has been gradually improving.  We discussed and started on Valtrex  prophylaxis to reduce the risk of recurrent shingles. She was also started on Lyrica to help with her severe neuralgia and I refilled her medication. She notes minimal appetite and was started on low-dose Marinol to help with this.  MEDICAL HISTORY:  Past Medical History:  Diagnosis Date   Barrett's esophagus    Bone neoplasm 06/24/2015   [facility]   metastatic poorly differentiated carcinoma. tumor left groin surgical removal with radiation tx.   Cataract    BILATERAL   Cigarette smoker two packs a day or less    Currently still smoking 2 PPD - Not interested in quitting at this time.   Colitis 2017   Colon polyps    hyperplastic, tubular adenomas, tubulovillous adenoma   Cough, persistent    hx. lung cancer ? primary-being evaluated, unsure of primary site.   Depression 06/24/2015   Diverticulosis    [place]   Endometriosis    Hysterectomy with BSO at age [age]rs   Esophageal adenocarcinoma [facility]   intramucosal   Gastritis    GERD (gastroesophageal reflux disease)    H/O: pneumonia    Hiatal hernia    Hyperlipidemia    Hypertension 06/24/2015   likely improved incidental to 40 lbs weight loss from her neoplasm. No Longer taking med for this as of 08-06-15   IBS (irritable bowel syndrome)    Pain    left hip-persistent"tumor of bone"-radiation tx. 10.   Vitamin D deficiency disease    SURGICAL HISTORY: Past Surgical History:  Procedure Laterality Date   ABDOMINAL HYSTERECTOMY     BALLOON DILATION N/A 10/08/2019   Procedure: BALLOON DILATION;  Surgeon: [name], MD;  Location: [facility];  Service: Gastroenterology;  Laterality: N/A;   BARTHOLIN GLAND CYST EXCISION  81 yo ago   Does not want if it was an infected cyst or tumor. Was soon as delivery   BIOPSY  01/02/2019   Procedure: BIOPSY;  Surgeon: [name], MD;  Location: [facility];  Service: Gastroenterology;;   CATARACT EXTRACTION     COLONOSCOPY W/ POLYPECTOMY     multiple  times - last done 09/2014 per patient.   ESOPHAGOGASTRODUODENOSCOPY (EGD) WITH PROPOFOL N/A 08/11/2015   Procedure: ESOPHAGOGASTRODUODENOSCOPY (EGD) WITH PROPOFOL;  Surgeon: [name], MD;  Location: [facility];  Service: Gastroenterology;  Laterality: N/A;   ESOPHAGOGASTRODUODENOSCOPY (EGD) WITH PROPOFOL N/A 01/02/2019   Procedure: ESOPHAGOGASTRODUODENOSCOPY (EGD) WITH PROPOFOL;  Surgeon: [name], MD;  Location: [facility];  Service: Gastroenterology;  Laterality: N/A;   ESOPHAGOGASTRODUODENOSCOPY (EGD) WITH PROPOFOL N/A 10/08/2019   Procedure: ESOPHAGOGASTRODUODENOSCOPY (EGD) WITH PROPOFOL;  Surgeon: [name], MD;  Location: [facility];  Service: Gastroenterology;  Laterality: N/A;   FLEXIBLE SIGMOIDOSCOPY N/A 06/24/2017   Procedure: FLEXIBLE SIGMOIDOSCOPY;  Surgeon: Manus Gunning, MD;  Location: [facility];  Service: Gastroenterology;  Laterality: N/A;   GANGLION CYST EXCISION     KNEE ARTHROSCOPY  age about 65 yrs   TONSILLECTOMY     TOTAL ABDOMINAL HYSTERECTOMY W/ BILATERAL SALPINGOOPHORECTOMY  at age 56 yrs   For endometriosis    SOCIAL HISTORY: Social History   Socioeconomic History   Marital status: Widowed    Spouse name: Not on file   Number of children: 2   Years of education: Not on file   Highest education level: Not on file  Occupational History   Not on file  Tobacco Use   Smoking status: Former    Packs/day: 1.00    Years: 60.00    Total pack years: 60.00    Types: Cigarettes    Quit date: 12/05/2014    Years since quitting: 7.6   Smokeless tobacco: Never  Vaping Use   Vaping Use: Never used  Substance and Sexual Activity   Alcohol use: No    Alcohol/week: 0.0 standard drinks of alcohol   Drug use: No   Sexual activity: Not Currently  Other Topics Concern   Not on file  Social History Narrative   Not on file   Social Determinants of Health   Financial Resource Strain: Not on file  Food Insecurity: Not on file  Transportation  Needs: No Transportation Needs (01/23/2019)   PRAPARE - Hydrologist (Medical): No    Lack of Transportation (Non-Medical): No  Physical Activity: Not on file  Stress: Not on file  Social Connections: Not on file  Intimate Partner Violence: Not on file    FAMILY HISTORY: Family History  Problem Relation Age of Onset   Colon cancer Brother    Colon cancer Brother    Stroke Mother    Colon cancer Father    Emphysema Father        smoked   Breast cancer Daughter 72       ER/PR+ stage II    ALLERGIES:  is allergic to penicillins, remeron [mirtazapine], and latex. patient wonders if she has a penicillin allergy but notes that she is uncertain about this.  MEDICATIONS:  Current Outpatient Medications  Medication Sig Dispense Refill   dronabinol (MARINOL) 2.5 MG capsule Take 1 capsule (2.5 mg total) by mouth 2 (two) times daily  before a meal. 60 capsule 0   valACYclovir (VALTREX) 500 MG tablet Take 1 tablet (500 mg total) by mouth 2 (two) times daily. 60 tablet 2   Biotin 5 MG CAPS Take 5 mg by mouth daily.     buPROPion (WELLBUTRIN XL) 150 MG 24 hr tablet Take 1 tablet (150 mg total) by mouth daily. 90 tablet 1   butalbital-acetaminophen-caffeine (FIORICET) 50-325-40 MG tablet Take 1 tablet by mouth every 6 (six) hours as needed for headache or migraine. 20 tablet 0   Calcium Citrate-Vitamin D (CALCIUM + D PO) Take 1 tablet by mouth daily.     Cyanocobalamin (B-12) 2500 MCG TABS Take 2,500 mcg by mouth daily.     doxycycline (VIBRA-TABS) 100 MG tablet Take 1 tablet (100 mg total) by mouth 2 (two) times daily. 20 tablet 0   escitalopram (LEXAPRO) 20 MG tablet TAKE 1 TABLET BY MOUTH EVERY DAY 90 tablet 1   Fluticasone-Umeclidin-Vilant (TRELEGY ELLIPTA) 200-62.5-25 MCG/ACT AEPB Inhale 1 puff into the lungs daily. 1 each 3   HYDROcodone-acetaminophen (NORCO) 5-325 MG tablet Take 1 tablet by mouth every 6 (six) hours as needed for moderate pain or severe pain. 30  tablet 0   lidocaine-prilocaine (EMLA) cream Apply 1 Application topically as needed. 30 g 0   metoCLOPramide (REGLAN) 10 MG tablet Take 1 tablet (10 mg total) by mouth every 8 (eight) hours as needed for nausea. 20 tablet 0   nystatin (MYCOSTATIN) 100000 UNIT/ML suspension Take 5 mLs (500,000 Units total) by mouth 4 (four) times daily. 200 mL 0   omeprazole (PRILOSEC) 40 MG capsule TAKE 1 CAPSULE BY MOUTH EVERY DAY BEFORE BREAKFAST 90 capsule 1   ondansetron (ZOFRAN-ODT) 4 MG disintegrating tablet Take 1 tablet (4 mg total) by mouth every 8 (eight) hours as needed for nausea or vomiting. 20 tablet 0   prednisoLONE acetate (PRED FORTE) 1 % ophthalmic suspension Place 1 drop into the right eye 4 (four) times daily.     pregabalin (LYRICA) 75 MG capsule Take 1 capsule (75 mg total) by mouth 2 (two) times daily. 60 capsule 2   traZODone (DESYREL) 50 MG tablet TAKE 1 TABLET BY MOUTH EVERYDAY AT BEDTIME 90 tablet 2   XARELTO 20 MG TABS tablet TAKE 1 TABLET BY MOUTH DAILY WITH SUPPER 30 tablet 2   No current facility-administered medications for this visit.    PHYSICAL EXAMINATION:  .BP (!) 101/55   Pulse 85   Temp (!) 97.5 F (36.4 C)   Resp 18   Wt 143 lb 3.2 oz (65 kg)   SpO2 99%   BMI 21.77 kg/m  . GENERAL:alert, in no acute distress and comfortable SKIN: no acute rashes, no significant lesions EYES: conjunctiva are pink and non-injected, sclera anicteric OROPHARYNX: MMM, no exudates, no oropharyngeal erythema or ulceration NECK: supple, no JVD LYMPH:  no palpable lymphadenopathy in the cervical, axillary or inguinal regions LUNGS: clear to auscultation b/l with normal respiratory effort HEART: regular rate & rhythm ABDOMEN:  normoactive bowel sounds , non tender, not distended. Extremity: no pedal edema PSYCH: alert & oriented x 3 with fluent speech NEURO: no focal motor/sensory deficits   LABORATORY DATA:  I have reviewed the data as listed  .    Latest Ref Rng & Units  07/22/2022   10:28 AM 06/20/2022   10:36 AM 06/09/2022    8:17 PM  CBC  WBC 4.0 - 10.5 K/uL 12.0  11.2  10.5   Hemoglobin 12.0 - 15.0 g/dL 12.5  15.0  15.7   Hematocrit 36.0 - 46.0 % 37.3  44.6  49.1   Platelets 150 - 400 K/uL 297  354  287    ANC 1.8k .    Latest Ref Rng & Units 07/22/2022   10:28 AM 06/20/2022   10:36 AM 06/09/2022    8:17 PM  CMP  Glucose 70 - 99 mg/dL 167  134  124   BUN 8 - 23 mg/dL 33  27  21   Creatinine 0.44 - 1.00 mg/dL 1.27  1.33  1.34   Sodium 135 - 145 mmol/L 140  140  138   Potassium 3.5 - 5.1 mmol/L 4.3  5.0  5.0   Chloride 98 - 111 mmol/L 107  106  101   CO2 22 - 32 mmol/L 28  18  26    Calcium 8.9 - 10.3 mg/dL 9.8  11.1  10.8   Total Protein 6.5 - 8.1 g/dL 7.1  8.5    Total Bilirubin 0.3 - 1.2 mg/dL 0.4  0.4    Alkaline Phos 38 - 126 U/L 67  98    AST 15 - 41 U/L 24  39    ALT 0 - 44 U/L 17  26          01/02/19 Esophagus Biopsy:    RADIOGRAPHIC STUDIES: .CT Chest Wo Contrast  Result Date: 07/15/2022 CLINICAL DATA:  Non-small cell lung cancer and esophageal adenocarcinoma. Chemotherapy and radiation therapy completed. Restaging. * Tracking Code: BO * EXAM: CT CHEST WITHOUT CONTRAST TECHNIQUE: Multidetector CT imaging of the chest was performed following the standard protocol without IV contrast. RADIATION DOSE REDUCTION: This exam was performed according to the departmental dose-optimization program which includes automated exposure control, adjustment of the mA and/or kV according to patient size and/or use of iterative reconstruction technique. COMPARISON:  CT chest 12/16/2021 and 05/20/2021. Abdominal CT 05/12/2022. FINDINGS: Cardiovascular: Right IJ Port-A-Cath extends to the mid SVC. There is atherosclerosis of the aorta, great vessels and coronary arteries. No acute vascular findings on noncontrast imaging. The heart size is normal. There is no pericardial effusion. Mediastinum/Nodes: There are no enlarged mediastinal, hilar or axillary lymph  nodes.Small hiatal hernia with stable mild wall thickening, but no focal mass lesion. The thyroid gland and trachea appear unremarkable. Lungs/Pleura: No pleural effusion or pneumothorax. Stable mild centrilobular emphysema and scattered linear scarring. No suspicious pulmonary nodule or confluent airspace opacity. Upper abdomen: No acute findings are seen within the visualized upper abdomen. There are stable low-density hepatic lesions which are likely cysts based on stability. Small gallstones versus sludge. Interval decreased size of the spleen which shows increased lobularity compared with previous chest CT. Musculoskeletal/Chest wall: There is no chest wall mass or suspicious osseous finding. Old left-sided rib fractures. IMPRESSION: 1. Stable chest CT without evidence of local recurrence or metastatic disease. 2. Decreased size of the spleen with increased lobularity compared with prior chest CT of 7 months ago, most likely due to interval splenic infarction or sequela of radiation therapy. 3. Coronary and aortic atherosclerosis (ICD10-I70.0). Emphysema (ICD10-J43.9). Electronically Signed   By: Richardean Sale M.D.   On: 07/15/2022 15:48    ASSESSMENT & PLAN:   81 y.o. female with  #1 Metastatic poorly differentiated carcinoma with likely lung primary non-small cell lung cancer.    CT of the head with and without contrast showed no evidence of metastatic disease. EGFR blood test mutation analysis negative. CT chest abdomen pelvis 04/19/2016 shows no evidence of disease progression. Patient tolerated  Nivolumab very well but was discontinued when she developed grade 2 Immune colitis. Has been off Nivolumab for >6 months  CT chest abdomen pelvis on 06/24/2016 shows no evidence of new disease or progression of metastatic disease. CT chest abdomen pelvis 09/06/2016 shows 1. Mixed interval response to therapy. 2. There is a new left ventral chest wall lesion deep to the pectoralis musculature worrisome  for metastatic disease. 3. Posterior lower lobe nodular densities are identified which may reflect areas of pulmonary metastasis. 4. Interval decrease in size of destructive lesion involving the left iliac bone.  CT chest abd pelvis 12/08/2016: Cystic mass involving the left ventral chest wall has resolved in the interval. Likely was a hematoma due to trauma. Interval increase in size of pleural base mass overlying the posterior and inferior left lower lobe. There is also a new left pleural effusion identified.  CT chest 02/01/2017: Residual irregular soft tissue thickening/volume loss and trace left pleural fluid at the base of the left hemithorax, overall improved in appearance from 12/08/2016. No measurable lesion.  CT chest 05/29/2017 shows no residual pleural based mass or significant pleural effusion in the left hemithorax. No evidence of thoracic metastatic disease. No evidence of progressive metastatic disease within the abdomen or pelvis. Mixed lytic and blastic lesion involving the left iliac bone and associated pathologic fracture are unchanged.   CT CAP 09/14/17 shows no new changes. She does have slight displacement of her fractured left iliac bone. Evidence of stable disease.   CT CAP 01/04/2018- No new or progressive metastatic disease. Stable large left iliac bone metastasis with associated chronic pathologic fracture.   CT chest/abd/pelvis done on 04/26/18 revealed Stable exam.  No new or progressive interval findings.  07/19/18 CT C/A/P revealed Stable exam.  No new or progressive interval findings. Large destructive left iliac lesion is similar to prior. Aortic Atherosclerosis and Emphysema.    11/06/18 CT C/A/P revealed Similar appearance of large mixed lytic and sclerotic lesion in the left ilium. No new metastatic lesions are otherwise noted elsewhere in the chest, abdomen or pelvis. 2. Interval development of thickening of the distal third of the esophagus. This is nonspecific, and  could be related to underlying reflux esophagitis. However, if there is any clinical concern for Barrett's metaplasia or esophageal neoplasia, further evaluation with nonemergent endoscopy could be considered. 3. Aortic atherosclerosis, in addition to left main coronary artery disease. Assessment for potential risk factor modification, dietary therapy or pharmacologic therapy may be warranted, if clinically Indicated. 4. Diffuse bronchial wall thickening with mild to moderate centrilobular and paraseptal emphysema; imaging findings suggestive of underlying COPD. 5. Additional incidental findings, as above.  #2  Adenocarcinoma of the Esophagus  Barrett's esophagus 4cms in the distal esophagus with low and high-grade dysplasia  01/02/19 Surgical pathology revealed adenocarcinoma of the esophagus   01/25/19 PET/CT revealed Distal esophageal primary, without hypermetabolic metastatic disease. 2. Chronic left iliac metastasis, as before. 3. Hypermetabolism within and superficial to the right gluteal musculature is most likely related to trauma and/or injection sites. 4. Aortic atherosclerosis, coronary artery atherosclerosis and emphysema.  S/p concurrent Carboplatin and Taxol weekly with RT of 45 Gy in 25 fractions and 5.4 Gy boost, completed between 02/04/19 and 03/27/19  07/03/2019 PET skull base to thigh revealed "1. Interval response to therapy. Significant reduction in FDG uptake associated with distal esophageal mass. SUV max currently 2.61 versus 16.97 previously. 2. 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 Unchanged 3. Aortic Atherosclerosis (ICD10-I70.0) and Emphysema (  ICD10-J43.9). Coronary artery calcifications."  07/15/2020 CT C/A/P ([id number]) ([id number]) revealed "1. No evidence of new or progressive metastatic disease in the chest, abdomen or pelvis."  #3 diarrhea-  now resolved was previously. S/p grade 2 likely related to immune colitis from her Nivolumab and also had c  diff colitis (s/p vancomycin) and possible underlying IBD Now better controlled. She was previously on on Lialda, budesonide,probiotics and lomotil but not currently taking any of these. Plan -Continue Sandostatin every 4 weeks   #4 h/o DVT and PE  -continue on Xarelto - no issues with bleeding   #5 history of COPD management per primary care physician  #6 severe shingles of the forehead with postherpetic neuralgia PLAN:  Patient has no clinical signs suggestive of progression of her lung cancer or her esophageal adenocarcinoma. She is struggling to recover from her severe shingles outbreak over the forehead with significant postherpetic neuralgia. -She has completed her Valtrex treatment dose and her forehead and right eye lid lesions have crusted over but given high risk outbreak we will keep her on Valtrex 500 mg p.o. twice daily for suppression of any additional shingles outbreaks for at least the next 2 to 3 months -I refilled her pregabalin to help with her postherpetic neuralgia. -She has had significant drop-off in her p.o. intake and we started her on low-dose Marinol to try to help her appetite for the next month or so. -She has had significant decline in her functional status as a part of her shingles infection and is having difficulties functioning on her own at home and is keen to work with home therapies.  She feels she is currently wheelchair-bound and wants to try to become independent again with her daily activities. -Referral to home care for home safety evaluation and for physical therapy was requested. -We we will hold off on Xgeva and Sandostatin at this time till she feels better.  FOLLOW UP: Plz cancel currently scheduled oncologic appointments RTC with Dr Irene Limbo with labs and appointment for Sandostatin and Xgeva in 2 months Referral to home care  The total time spent in the appointment was 32 minutes*.  All of the patient's questions were answered with apparent  satisfaction. The patient knows to call the clinic with any problems, questions or concerns.   Sullivan Lone MD MS AAHIVMS [facility] Hematology/Oncology Physician [facility]  .*Total Encounter Time as defined by the Centers for Medicare and Medicaid Services includes, in addition to the face-to-face time of a patient visit (documented in the note above) non-face-to-face time: obtaining and reviewing outside history, ordering and reviewing medications, tests or procedures, care coordination (communications with other health care professionals or caregivers) and documentation in the medical record.

## 2022-07-29 DIAGNOSIS — D849 Immunodeficiency, unspecified: Secondary | ICD-10-CM | POA: Diagnosis not present

## 2022-07-29 DIAGNOSIS — F32A Depression, unspecified: Secondary | ICD-10-CM | POA: Diagnosis not present

## 2022-07-29 DIAGNOSIS — J439 Emphysema, unspecified: Secondary | ICD-10-CM | POA: Diagnosis not present

## 2022-07-29 DIAGNOSIS — K219 Gastro-esophageal reflux disease without esophagitis: Secondary | ICD-10-CM | POA: Diagnosis not present

## 2022-07-29 DIAGNOSIS — K2270 Barrett's esophagus without dysplasia: Secondary | ICD-10-CM | POA: Diagnosis not present

## 2022-07-29 DIAGNOSIS — B0229 Other postherpetic nervous system involvement: Secondary | ICD-10-CM | POA: Diagnosis not present

## 2022-07-29 DIAGNOSIS — C155 Malignant neoplasm of lower third of esophagus: Secondary | ICD-10-CM | POA: Diagnosis not present

## 2022-07-29 DIAGNOSIS — Z7951 Long term (current) use of inhaled steroids: Secondary | ICD-10-CM | POA: Diagnosis not present

## 2022-07-29 DIAGNOSIS — I10 Essential (primary) hypertension: Secondary | ICD-10-CM | POA: Diagnosis not present

## 2022-07-29 DIAGNOSIS — E785 Hyperlipidemia, unspecified: Secondary | ICD-10-CM | POA: Diagnosis not present

## 2022-07-29 DIAGNOSIS — D126 Benign neoplasm of colon, unspecified: Secondary | ICD-10-CM | POA: Diagnosis not present

## 2022-07-29 DIAGNOSIS — K5790 Diverticulosis of intestine, part unspecified, without perforation or abscess without bleeding: Secondary | ICD-10-CM | POA: Diagnosis not present

## 2022-07-29 DIAGNOSIS — K589 Irritable bowel syndrome without diarrhea: Secondary | ICD-10-CM | POA: Diagnosis not present

## 2022-07-29 DIAGNOSIS — C7951 Secondary malignant neoplasm of bone: Secondary | ICD-10-CM | POA: Diagnosis not present

## 2022-07-29 DIAGNOSIS — E559 Vitamin D deficiency, unspecified: Secondary | ICD-10-CM | POA: Diagnosis not present

## 2022-07-29 DIAGNOSIS — C3490 Malignant neoplasm of unspecified part of unspecified bronchus or lung: Secondary | ICD-10-CM | POA: Diagnosis not present

## 2022-07-29 DIAGNOSIS — K449 Diaphragmatic hernia without obstruction or gangrene: Secondary | ICD-10-CM | POA: Diagnosis not present

## 2022-07-29 DIAGNOSIS — Z7901 Long term (current) use of anticoagulants: Secondary | ICD-10-CM | POA: Diagnosis not present

## 2022-08-02 DIAGNOSIS — Z7951 Long term (current) use of inhaled steroids: Secondary | ICD-10-CM | POA: Diagnosis not present

## 2022-08-02 DIAGNOSIS — E559 Vitamin D deficiency, unspecified: Secondary | ICD-10-CM | POA: Diagnosis not present

## 2022-08-02 DIAGNOSIS — D849 Immunodeficiency, unspecified: Secondary | ICD-10-CM | POA: Diagnosis not present

## 2022-08-02 DIAGNOSIS — K589 Irritable bowel syndrome without diarrhea: Secondary | ICD-10-CM | POA: Diagnosis not present

## 2022-08-02 DIAGNOSIS — I10 Essential (primary) hypertension: Secondary | ICD-10-CM | POA: Diagnosis not present

## 2022-08-02 DIAGNOSIS — E785 Hyperlipidemia, unspecified: Secondary | ICD-10-CM | POA: Diagnosis not present

## 2022-08-02 DIAGNOSIS — Z7901 Long term (current) use of anticoagulants: Secondary | ICD-10-CM | POA: Diagnosis not present

## 2022-08-02 DIAGNOSIS — K2270 Barrett's esophagus without dysplasia: Secondary | ICD-10-CM | POA: Diagnosis not present

## 2022-08-02 DIAGNOSIS — C3490 Malignant neoplasm of unspecified part of unspecified bronchus or lung: Secondary | ICD-10-CM | POA: Diagnosis not present

## 2022-08-02 DIAGNOSIS — K449 Diaphragmatic hernia without obstruction or gangrene: Secondary | ICD-10-CM | POA: Diagnosis not present

## 2022-08-02 DIAGNOSIS — K219 Gastro-esophageal reflux disease without esophagitis: Secondary | ICD-10-CM | POA: Diagnosis not present

## 2022-08-02 DIAGNOSIS — J439 Emphysema, unspecified: Secondary | ICD-10-CM | POA: Diagnosis not present

## 2022-08-02 DIAGNOSIS — D126 Benign neoplasm of colon, unspecified: Secondary | ICD-10-CM | POA: Diagnosis not present

## 2022-08-02 DIAGNOSIS — C7951 Secondary malignant neoplasm of bone: Secondary | ICD-10-CM | POA: Diagnosis not present

## 2022-08-02 DIAGNOSIS — C155 Malignant neoplasm of lower third of esophagus: Secondary | ICD-10-CM | POA: Diagnosis not present

## 2022-08-02 DIAGNOSIS — K5790 Diverticulosis of intestine, part unspecified, without perforation or abscess without bleeding: Secondary | ICD-10-CM | POA: Diagnosis not present

## 2022-08-02 DIAGNOSIS — F32A Depression, unspecified: Secondary | ICD-10-CM | POA: Diagnosis not present

## 2022-08-02 DIAGNOSIS — B0229 Other postherpetic nervous system involvement: Secondary | ICD-10-CM | POA: Diagnosis not present

## 2022-08-03 DIAGNOSIS — K2270 Barrett's esophagus without dysplasia: Secondary | ICD-10-CM | POA: Diagnosis not present

## 2022-08-03 DIAGNOSIS — D849 Immunodeficiency, unspecified: Secondary | ICD-10-CM | POA: Diagnosis not present

## 2022-08-03 DIAGNOSIS — F32A Depression, unspecified: Secondary | ICD-10-CM | POA: Diagnosis not present

## 2022-08-03 DIAGNOSIS — D126 Benign neoplasm of colon, unspecified: Secondary | ICD-10-CM | POA: Diagnosis not present

## 2022-08-03 DIAGNOSIS — I10 Essential (primary) hypertension: Secondary | ICD-10-CM | POA: Diagnosis not present

## 2022-08-03 DIAGNOSIS — B0229 Other postherpetic nervous system involvement: Secondary | ICD-10-CM | POA: Diagnosis not present

## 2022-08-03 DIAGNOSIS — J439 Emphysema, unspecified: Secondary | ICD-10-CM | POA: Diagnosis not present

## 2022-08-03 DIAGNOSIS — C155 Malignant neoplasm of lower third of esophagus: Secondary | ICD-10-CM | POA: Diagnosis not present

## 2022-08-03 DIAGNOSIS — C7951 Secondary malignant neoplasm of bone: Secondary | ICD-10-CM | POA: Diagnosis not present

## 2022-08-03 DIAGNOSIS — E559 Vitamin D deficiency, unspecified: Secondary | ICD-10-CM | POA: Diagnosis not present

## 2022-08-03 DIAGNOSIS — K5790 Diverticulosis of intestine, part unspecified, without perforation or abscess without bleeding: Secondary | ICD-10-CM | POA: Diagnosis not present

## 2022-08-03 DIAGNOSIS — K589 Irritable bowel syndrome without diarrhea: Secondary | ICD-10-CM | POA: Diagnosis not present

## 2022-08-03 DIAGNOSIS — C3490 Malignant neoplasm of unspecified part of unspecified bronchus or lung: Secondary | ICD-10-CM | POA: Diagnosis not present

## 2022-08-03 DIAGNOSIS — Z7951 Long term (current) use of inhaled steroids: Secondary | ICD-10-CM | POA: Diagnosis not present

## 2022-08-03 DIAGNOSIS — K219 Gastro-esophageal reflux disease without esophagitis: Secondary | ICD-10-CM | POA: Diagnosis not present

## 2022-08-03 DIAGNOSIS — Z7901 Long term (current) use of anticoagulants: Secondary | ICD-10-CM | POA: Diagnosis not present

## 2022-08-03 DIAGNOSIS — K449 Diaphragmatic hernia without obstruction or gangrene: Secondary | ICD-10-CM | POA: Diagnosis not present

## 2022-08-03 DIAGNOSIS — E785 Hyperlipidemia, unspecified: Secondary | ICD-10-CM | POA: Diagnosis not present

## 2022-08-06 DIAGNOSIS — F32A Depression, unspecified: Secondary | ICD-10-CM | POA: Diagnosis not present

## 2022-08-06 DIAGNOSIS — E559 Vitamin D deficiency, unspecified: Secondary | ICD-10-CM | POA: Diagnosis not present

## 2022-08-06 DIAGNOSIS — K449 Diaphragmatic hernia without obstruction or gangrene: Secondary | ICD-10-CM | POA: Diagnosis not present

## 2022-08-06 DIAGNOSIS — J439 Emphysema, unspecified: Secondary | ICD-10-CM | POA: Diagnosis not present

## 2022-08-06 DIAGNOSIS — I10 Essential (primary) hypertension: Secondary | ICD-10-CM | POA: Diagnosis not present

## 2022-08-06 DIAGNOSIS — D126 Benign neoplasm of colon, unspecified: Secondary | ICD-10-CM | POA: Diagnosis not present

## 2022-08-06 DIAGNOSIS — Z7951 Long term (current) use of inhaled steroids: Secondary | ICD-10-CM | POA: Diagnosis not present

## 2022-08-06 DIAGNOSIS — K219 Gastro-esophageal reflux disease without esophagitis: Secondary | ICD-10-CM | POA: Diagnosis not present

## 2022-08-06 DIAGNOSIS — D849 Immunodeficiency, unspecified: Secondary | ICD-10-CM | POA: Diagnosis not present

## 2022-08-06 DIAGNOSIS — C155 Malignant neoplasm of lower third of esophagus: Secondary | ICD-10-CM | POA: Diagnosis not present

## 2022-08-06 DIAGNOSIS — C3490 Malignant neoplasm of unspecified part of unspecified bronchus or lung: Secondary | ICD-10-CM | POA: Diagnosis not present

## 2022-08-06 DIAGNOSIS — K2270 Barrett's esophagus without dysplasia: Secondary | ICD-10-CM | POA: Diagnosis not present

## 2022-08-06 DIAGNOSIS — K5790 Diverticulosis of intestine, part unspecified, without perforation or abscess without bleeding: Secondary | ICD-10-CM | POA: Diagnosis not present

## 2022-08-06 DIAGNOSIS — B0229 Other postherpetic nervous system involvement: Secondary | ICD-10-CM | POA: Diagnosis not present

## 2022-08-06 DIAGNOSIS — Z7901 Long term (current) use of anticoagulants: Secondary | ICD-10-CM | POA: Diagnosis not present

## 2022-08-06 DIAGNOSIS — E785 Hyperlipidemia, unspecified: Secondary | ICD-10-CM | POA: Diagnosis not present

## 2022-08-06 DIAGNOSIS — C7951 Secondary malignant neoplasm of bone: Secondary | ICD-10-CM | POA: Diagnosis not present

## 2022-08-06 DIAGNOSIS — K589 Irritable bowel syndrome without diarrhea: Secondary | ICD-10-CM | POA: Diagnosis not present

## 2022-08-10 DIAGNOSIS — Z7901 Long term (current) use of anticoagulants: Secondary | ICD-10-CM | POA: Diagnosis not present

## 2022-08-10 DIAGNOSIS — B0229 Other postherpetic nervous system involvement: Secondary | ICD-10-CM | POA: Diagnosis not present

## 2022-08-10 DIAGNOSIS — I10 Essential (primary) hypertension: Secondary | ICD-10-CM | POA: Diagnosis not present

## 2022-08-10 DIAGNOSIS — E785 Hyperlipidemia, unspecified: Secondary | ICD-10-CM | POA: Diagnosis not present

## 2022-08-10 DIAGNOSIS — K2270 Barrett's esophagus without dysplasia: Secondary | ICD-10-CM | POA: Diagnosis not present

## 2022-08-10 DIAGNOSIS — J439 Emphysema, unspecified: Secondary | ICD-10-CM | POA: Diagnosis not present

## 2022-08-10 DIAGNOSIS — C7951 Secondary malignant neoplasm of bone: Secondary | ICD-10-CM | POA: Diagnosis not present

## 2022-08-10 DIAGNOSIS — K589 Irritable bowel syndrome without diarrhea: Secondary | ICD-10-CM | POA: Diagnosis not present

## 2022-08-10 DIAGNOSIS — E559 Vitamin D deficiency, unspecified: Secondary | ICD-10-CM | POA: Diagnosis not present

## 2022-08-10 DIAGNOSIS — C3490 Malignant neoplasm of unspecified part of unspecified bronchus or lung: Secondary | ICD-10-CM | POA: Diagnosis not present

## 2022-08-10 DIAGNOSIS — K219 Gastro-esophageal reflux disease without esophagitis: Secondary | ICD-10-CM | POA: Diagnosis not present

## 2022-08-10 DIAGNOSIS — C155 Malignant neoplasm of lower third of esophagus: Secondary | ICD-10-CM | POA: Diagnosis not present

## 2022-08-10 DIAGNOSIS — D126 Benign neoplasm of colon, unspecified: Secondary | ICD-10-CM | POA: Diagnosis not present

## 2022-08-10 DIAGNOSIS — D849 Immunodeficiency, unspecified: Secondary | ICD-10-CM | POA: Diagnosis not present

## 2022-08-10 DIAGNOSIS — Z7951 Long term (current) use of inhaled steroids: Secondary | ICD-10-CM | POA: Diagnosis not present

## 2022-08-10 DIAGNOSIS — K5790 Diverticulosis of intestine, part unspecified, without perforation or abscess without bleeding: Secondary | ICD-10-CM | POA: Diagnosis not present

## 2022-08-10 DIAGNOSIS — K449 Diaphragmatic hernia without obstruction or gangrene: Secondary | ICD-10-CM | POA: Diagnosis not present

## 2022-08-10 DIAGNOSIS — F32A Depression, unspecified: Secondary | ICD-10-CM | POA: Diagnosis not present

## 2022-08-11 DIAGNOSIS — B0239 Other herpes zoster eye disease: Secondary | ICD-10-CM | POA: Diagnosis not present

## 2022-08-12 DIAGNOSIS — H16001 Unspecified corneal ulcer, right eye: Secondary | ICD-10-CM | POA: Diagnosis not present

## 2022-08-12 DIAGNOSIS — B0233 Zoster keratitis: Secondary | ICD-10-CM | POA: Diagnosis not present

## 2022-08-15 DIAGNOSIS — K449 Diaphragmatic hernia without obstruction or gangrene: Secondary | ICD-10-CM | POA: Diagnosis not present

## 2022-08-15 DIAGNOSIS — C155 Malignant neoplasm of lower third of esophagus: Secondary | ICD-10-CM | POA: Diagnosis not present

## 2022-08-15 DIAGNOSIS — Z7901 Long term (current) use of anticoagulants: Secondary | ICD-10-CM | POA: Diagnosis not present

## 2022-08-15 DIAGNOSIS — K2270 Barrett's esophagus without dysplasia: Secondary | ICD-10-CM | POA: Diagnosis not present

## 2022-08-15 DIAGNOSIS — C3490 Malignant neoplasm of unspecified part of unspecified bronchus or lung: Secondary | ICD-10-CM | POA: Diagnosis not present

## 2022-08-15 DIAGNOSIS — I10 Essential (primary) hypertension: Secondary | ICD-10-CM | POA: Diagnosis not present

## 2022-08-15 DIAGNOSIS — D126 Benign neoplasm of colon, unspecified: Secondary | ICD-10-CM | POA: Diagnosis not present

## 2022-08-15 DIAGNOSIS — Z7951 Long term (current) use of inhaled steroids: Secondary | ICD-10-CM | POA: Diagnosis not present

## 2022-08-15 DIAGNOSIS — C7951 Secondary malignant neoplasm of bone: Secondary | ICD-10-CM | POA: Diagnosis not present

## 2022-08-15 DIAGNOSIS — E559 Vitamin D deficiency, unspecified: Secondary | ICD-10-CM | POA: Diagnosis not present

## 2022-08-15 DIAGNOSIS — D849 Immunodeficiency, unspecified: Secondary | ICD-10-CM | POA: Diagnosis not present

## 2022-08-15 DIAGNOSIS — J439 Emphysema, unspecified: Secondary | ICD-10-CM | POA: Diagnosis not present

## 2022-08-15 DIAGNOSIS — K219 Gastro-esophageal reflux disease without esophagitis: Secondary | ICD-10-CM | POA: Diagnosis not present

## 2022-08-15 DIAGNOSIS — B0229 Other postherpetic nervous system involvement: Secondary | ICD-10-CM | POA: Diagnosis not present

## 2022-08-15 DIAGNOSIS — F32A Depression, unspecified: Secondary | ICD-10-CM | POA: Diagnosis not present

## 2022-08-15 DIAGNOSIS — K589 Irritable bowel syndrome without diarrhea: Secondary | ICD-10-CM | POA: Diagnosis not present

## 2022-08-15 DIAGNOSIS — K5790 Diverticulosis of intestine, part unspecified, without perforation or abscess without bleeding: Secondary | ICD-10-CM | POA: Diagnosis not present

## 2022-08-15 DIAGNOSIS — E785 Hyperlipidemia, unspecified: Secondary | ICD-10-CM | POA: Diagnosis not present

## 2022-08-16 DIAGNOSIS — B0233 Zoster keratitis: Secondary | ICD-10-CM | POA: Diagnosis not present

## 2022-08-19 ENCOUNTER — Inpatient Hospital Stay: Payer: Medicare Other

## 2022-08-23 DIAGNOSIS — H18891 Other specified disorders of cornea, right eye: Secondary | ICD-10-CM | POA: Diagnosis not present

## 2022-08-25 DIAGNOSIS — J439 Emphysema, unspecified: Secondary | ICD-10-CM | POA: Diagnosis not present

## 2022-08-25 DIAGNOSIS — K449 Diaphragmatic hernia without obstruction or gangrene: Secondary | ICD-10-CM | POA: Diagnosis not present

## 2022-08-25 DIAGNOSIS — C155 Malignant neoplasm of lower third of esophagus: Secondary | ICD-10-CM | POA: Diagnosis not present

## 2022-08-25 DIAGNOSIS — B0229 Other postherpetic nervous system involvement: Secondary | ICD-10-CM | POA: Diagnosis not present

## 2022-08-25 DIAGNOSIS — Z7901 Long term (current) use of anticoagulants: Secondary | ICD-10-CM | POA: Diagnosis not present

## 2022-08-25 DIAGNOSIS — C3490 Malignant neoplasm of unspecified part of unspecified bronchus or lung: Secondary | ICD-10-CM | POA: Diagnosis not present

## 2022-08-25 DIAGNOSIS — K219 Gastro-esophageal reflux disease without esophagitis: Secondary | ICD-10-CM | POA: Diagnosis not present

## 2022-08-25 DIAGNOSIS — D849 Immunodeficiency, unspecified: Secondary | ICD-10-CM | POA: Diagnosis not present

## 2022-08-25 DIAGNOSIS — I10 Essential (primary) hypertension: Secondary | ICD-10-CM | POA: Diagnosis not present

## 2022-08-25 DIAGNOSIS — K2270 Barrett's esophagus without dysplasia: Secondary | ICD-10-CM | POA: Diagnosis not present

## 2022-08-25 DIAGNOSIS — C7951 Secondary malignant neoplasm of bone: Secondary | ICD-10-CM | POA: Diagnosis not present

## 2022-08-25 DIAGNOSIS — Z7951 Long term (current) use of inhaled steroids: Secondary | ICD-10-CM | POA: Diagnosis not present

## 2022-08-25 DIAGNOSIS — E785 Hyperlipidemia, unspecified: Secondary | ICD-10-CM | POA: Diagnosis not present

## 2022-08-25 DIAGNOSIS — E559 Vitamin D deficiency, unspecified: Secondary | ICD-10-CM | POA: Diagnosis not present

## 2022-08-25 DIAGNOSIS — D126 Benign neoplasm of colon, unspecified: Secondary | ICD-10-CM | POA: Diagnosis not present

## 2022-08-25 DIAGNOSIS — F32A Depression, unspecified: Secondary | ICD-10-CM | POA: Diagnosis not present

## 2022-08-25 DIAGNOSIS — K589 Irritable bowel syndrome without diarrhea: Secondary | ICD-10-CM | POA: Diagnosis not present

## 2022-08-25 DIAGNOSIS — K5790 Diverticulosis of intestine, part unspecified, without perforation or abscess without bleeding: Secondary | ICD-10-CM | POA: Diagnosis not present

## 2022-08-29 DIAGNOSIS — E785 Hyperlipidemia, unspecified: Secondary | ICD-10-CM | POA: Diagnosis not present

## 2022-08-29 DIAGNOSIS — Z7901 Long term (current) use of anticoagulants: Secondary | ICD-10-CM | POA: Diagnosis not present

## 2022-08-29 DIAGNOSIS — K219 Gastro-esophageal reflux disease without esophagitis: Secondary | ICD-10-CM | POA: Diagnosis not present

## 2022-08-29 DIAGNOSIS — I10 Essential (primary) hypertension: Secondary | ICD-10-CM | POA: Diagnosis not present

## 2022-08-29 DIAGNOSIS — F32A Depression, unspecified: Secondary | ICD-10-CM | POA: Diagnosis not present

## 2022-08-29 DIAGNOSIS — C7951 Secondary malignant neoplasm of bone: Secondary | ICD-10-CM | POA: Diagnosis not present

## 2022-08-29 DIAGNOSIS — K589 Irritable bowel syndrome without diarrhea: Secondary | ICD-10-CM | POA: Diagnosis not present

## 2022-08-29 DIAGNOSIS — D849 Immunodeficiency, unspecified: Secondary | ICD-10-CM | POA: Diagnosis not present

## 2022-08-29 DIAGNOSIS — K449 Diaphragmatic hernia without obstruction or gangrene: Secondary | ICD-10-CM | POA: Diagnosis not present

## 2022-08-29 DIAGNOSIS — K5790 Diverticulosis of intestine, part unspecified, without perforation or abscess without bleeding: Secondary | ICD-10-CM | POA: Diagnosis not present

## 2022-08-29 DIAGNOSIS — B0229 Other postherpetic nervous system involvement: Secondary | ICD-10-CM | POA: Diagnosis not present

## 2022-08-29 DIAGNOSIS — C155 Malignant neoplasm of lower third of esophagus: Secondary | ICD-10-CM | POA: Diagnosis not present

## 2022-08-29 DIAGNOSIS — K2270 Barrett's esophagus without dysplasia: Secondary | ICD-10-CM | POA: Diagnosis not present

## 2022-08-29 DIAGNOSIS — J439 Emphysema, unspecified: Secondary | ICD-10-CM | POA: Diagnosis not present

## 2022-08-29 DIAGNOSIS — Z7951 Long term (current) use of inhaled steroids: Secondary | ICD-10-CM | POA: Diagnosis not present

## 2022-08-29 DIAGNOSIS — C3490 Malignant neoplasm of unspecified part of unspecified bronchus or lung: Secondary | ICD-10-CM | POA: Diagnosis not present

## 2022-08-29 DIAGNOSIS — D126 Benign neoplasm of colon, unspecified: Secondary | ICD-10-CM | POA: Diagnosis not present

## 2022-08-29 DIAGNOSIS — E559 Vitamin D deficiency, unspecified: Secondary | ICD-10-CM | POA: Diagnosis not present

## 2022-08-31 DIAGNOSIS — D849 Immunodeficiency, unspecified: Secondary | ICD-10-CM | POA: Diagnosis not present

## 2022-08-31 DIAGNOSIS — F32A Depression, unspecified: Secondary | ICD-10-CM | POA: Diagnosis not present

## 2022-08-31 DIAGNOSIS — C3490 Malignant neoplasm of unspecified part of unspecified bronchus or lung: Secondary | ICD-10-CM | POA: Diagnosis not present

## 2022-08-31 DIAGNOSIS — I10 Essential (primary) hypertension: Secondary | ICD-10-CM | POA: Diagnosis not present

## 2022-08-31 DIAGNOSIS — K449 Diaphragmatic hernia without obstruction or gangrene: Secondary | ICD-10-CM | POA: Diagnosis not present

## 2022-08-31 DIAGNOSIS — K5790 Diverticulosis of intestine, part unspecified, without perforation or abscess without bleeding: Secondary | ICD-10-CM | POA: Diagnosis not present

## 2022-08-31 DIAGNOSIS — J439 Emphysema, unspecified: Secondary | ICD-10-CM | POA: Diagnosis not present

## 2022-08-31 DIAGNOSIS — Z7951 Long term (current) use of inhaled steroids: Secondary | ICD-10-CM | POA: Diagnosis not present

## 2022-08-31 DIAGNOSIS — K589 Irritable bowel syndrome without diarrhea: Secondary | ICD-10-CM | POA: Diagnosis not present

## 2022-08-31 DIAGNOSIS — B0229 Other postherpetic nervous system involvement: Secondary | ICD-10-CM | POA: Diagnosis not present

## 2022-08-31 DIAGNOSIS — C155 Malignant neoplasm of lower third of esophagus: Secondary | ICD-10-CM | POA: Diagnosis not present

## 2022-08-31 DIAGNOSIS — K2270 Barrett's esophagus without dysplasia: Secondary | ICD-10-CM | POA: Diagnosis not present

## 2022-08-31 DIAGNOSIS — Z7901 Long term (current) use of anticoagulants: Secondary | ICD-10-CM | POA: Diagnosis not present

## 2022-08-31 DIAGNOSIS — E559 Vitamin D deficiency, unspecified: Secondary | ICD-10-CM | POA: Diagnosis not present

## 2022-08-31 DIAGNOSIS — K219 Gastro-esophageal reflux disease without esophagitis: Secondary | ICD-10-CM | POA: Diagnosis not present

## 2022-08-31 DIAGNOSIS — D126 Benign neoplasm of colon, unspecified: Secondary | ICD-10-CM | POA: Diagnosis not present

## 2022-08-31 DIAGNOSIS — C7951 Secondary malignant neoplasm of bone: Secondary | ICD-10-CM | POA: Diagnosis not present

## 2022-08-31 DIAGNOSIS — E785 Hyperlipidemia, unspecified: Secondary | ICD-10-CM | POA: Diagnosis not present

## 2022-09-04 ENCOUNTER — Inpatient Hospital Stay (HOSPITAL_COMMUNITY)
Admission: EM | Admit: 2022-09-04 | Discharge: 2022-09-12 | DRG: 690 | Disposition: A | Discharge status: Home Health | Payer: Medicare Other | Attending: Internal Medicine | Admitting: Internal Medicine

## 2022-09-04 ENCOUNTER — Encounter (HOSPITAL_COMMUNITY): Payer: Self-pay

## 2022-09-04 ENCOUNTER — Other Ambulatory Visit: Payer: Self-pay

## 2022-09-04 ENCOUNTER — Emergency Department (HOSPITAL_COMMUNITY): Payer: Medicare Other

## 2022-09-04 DIAGNOSIS — M542 Cervicalgia: Secondary | ICD-10-CM | POA: Diagnosis not present

## 2022-09-04 DIAGNOSIS — I6782 Cerebral ischemia: Secondary | ICD-10-CM | POA: Diagnosis not present

## 2022-09-04 DIAGNOSIS — E8809 Other disorders of plasma-protein metabolism, not elsewhere classified: Secondary | ICD-10-CM | POA: Diagnosis present

## 2022-09-04 DIAGNOSIS — N3001 Acute cystitis with hematuria: Secondary | ICD-10-CM | POA: Diagnosis not present

## 2022-09-04 DIAGNOSIS — C155 Malignant neoplasm of lower third of esophagus: Secondary | ICD-10-CM | POA: Diagnosis present

## 2022-09-04 DIAGNOSIS — Y92009 Unspecified place in unspecified non-institutional (private) residence as the place of occurrence of the external cause: Secondary | ICD-10-CM | POA: Diagnosis not present

## 2022-09-04 DIAGNOSIS — F32A Depression, unspecified: Secondary | ICD-10-CM | POA: Diagnosis present

## 2022-09-04 DIAGNOSIS — E872 Acidosis, unspecified: Secondary | ICD-10-CM | POA: Diagnosis present

## 2022-09-04 DIAGNOSIS — S0990XA Unspecified injury of head, initial encounter: Secondary | ICD-10-CM | POA: Diagnosis not present

## 2022-09-04 DIAGNOSIS — R0902 Hypoxemia: Secondary | ICD-10-CM | POA: Diagnosis not present

## 2022-09-04 DIAGNOSIS — H5461 Unqualified visual loss, right eye, normal vision left eye: Secondary | ICD-10-CM | POA: Diagnosis present

## 2022-09-04 DIAGNOSIS — Z743 Need for continuous supervision: Secondary | ICD-10-CM | POA: Diagnosis not present

## 2022-09-04 DIAGNOSIS — W19XXXA Unspecified fall, initial encounter: Secondary | ICD-10-CM | POA: Diagnosis not present

## 2022-09-04 DIAGNOSIS — F419 Anxiety disorder, unspecified: Secondary | ICD-10-CM | POA: Diagnosis present

## 2022-09-04 DIAGNOSIS — N390 Urinary tract infection, site not specified: Secondary | ICD-10-CM | POA: Diagnosis present

## 2022-09-04 DIAGNOSIS — I10 Essential (primary) hypertension: Secondary | ICD-10-CM | POA: Diagnosis not present

## 2022-09-04 DIAGNOSIS — N3000 Acute cystitis without hematuria: Principal | ICD-10-CM | POA: Diagnosis present

## 2022-09-04 DIAGNOSIS — F17210 Nicotine dependence, cigarettes, uncomplicated: Secondary | ICD-10-CM | POA: Diagnosis present

## 2022-09-04 DIAGNOSIS — S199XXA Unspecified injury of neck, initial encounter: Secondary | ICD-10-CM | POA: Diagnosis not present

## 2022-09-04 DIAGNOSIS — I959 Hypotension, unspecified: Secondary | ICD-10-CM | POA: Diagnosis not present

## 2022-09-04 DIAGNOSIS — Z85118 Personal history of other malignant neoplasm of bronchus and lung: Secondary | ICD-10-CM

## 2022-09-04 DIAGNOSIS — Z91128 Patient's intentional underdosing of medication regimen for other reason: Secondary | ICD-10-CM

## 2022-09-04 DIAGNOSIS — N179 Acute kidney failure, unspecified: Secondary | ICD-10-CM | POA: Diagnosis not present

## 2022-09-04 DIAGNOSIS — R509 Fever, unspecified: Secondary | ICD-10-CM | POA: Diagnosis not present

## 2022-09-04 DIAGNOSIS — E559 Vitamin D deficiency, unspecified: Secondary | ICD-10-CM | POA: Diagnosis present

## 2022-09-04 DIAGNOSIS — I480 Paroxysmal atrial fibrillation: Secondary | ICD-10-CM | POA: Diagnosis not present

## 2022-09-04 DIAGNOSIS — Z7951 Long term (current) use of inhaled steroids: Secondary | ICD-10-CM

## 2022-09-04 DIAGNOSIS — Z880 Allergy status to penicillin: Secondary | ICD-10-CM

## 2022-09-04 DIAGNOSIS — E785 Hyperlipidemia, unspecified: Secondary | ICD-10-CM | POA: Diagnosis not present

## 2022-09-04 DIAGNOSIS — N3090 Cystitis, unspecified without hematuria: Secondary | ICD-10-CM | POA: Diagnosis present

## 2022-09-04 DIAGNOSIS — N1832 Chronic kidney disease, stage 3b: Secondary | ICD-10-CM

## 2022-09-04 DIAGNOSIS — Z923 Personal history of irradiation: Secondary | ICD-10-CM

## 2022-09-04 DIAGNOSIS — C7951 Secondary malignant neoplasm of bone: Secondary | ICD-10-CM | POA: Diagnosis present

## 2022-09-04 DIAGNOSIS — Z91040 Latex allergy status: Secondary | ICD-10-CM

## 2022-09-04 DIAGNOSIS — J432 Centrilobular emphysema: Secondary | ICD-10-CM | POA: Diagnosis not present

## 2022-09-04 DIAGNOSIS — Z825 Family history of asthma and other chronic lower respiratory diseases: Secondary | ICD-10-CM

## 2022-09-04 DIAGNOSIS — B0230 Zoster ocular disease, unspecified: Secondary | ICD-10-CM | POA: Diagnosis not present

## 2022-09-04 DIAGNOSIS — D539 Nutritional anemia, unspecified: Secondary | ICD-10-CM | POA: Diagnosis not present

## 2022-09-04 DIAGNOSIS — B961 Klebsiella pneumoniae [K. pneumoniae] as the cause of diseases classified elsewhere: Secondary | ICD-10-CM | POA: Diagnosis present

## 2022-09-04 DIAGNOSIS — Z8701 Personal history of pneumonia (recurrent): Secondary | ICD-10-CM

## 2022-09-04 DIAGNOSIS — Z9079 Acquired absence of other genital organ(s): Secondary | ICD-10-CM

## 2022-09-04 DIAGNOSIS — K219 Gastro-esophageal reflux disease without esophagitis: Secondary | ICD-10-CM | POA: Diagnosis present

## 2022-09-04 DIAGNOSIS — I4891 Unspecified atrial fibrillation: Secondary | ICD-10-CM | POA: Diagnosis not present

## 2022-09-04 DIAGNOSIS — I129 Hypertensive chronic kidney disease with stage 1 through stage 4 chronic kidney disease, or unspecified chronic kidney disease: Secondary | ICD-10-CM | POA: Diagnosis not present

## 2022-09-04 DIAGNOSIS — C3490 Malignant neoplasm of unspecified part of unspecified bronchus or lung: Secondary | ICD-10-CM | POA: Diagnosis present

## 2022-09-04 DIAGNOSIS — Z86711 Personal history of pulmonary embolism: Secondary | ICD-10-CM | POA: Diagnosis not present

## 2022-09-04 DIAGNOSIS — R6889 Other general symptoms and signs: Secondary | ICD-10-CM | POA: Diagnosis not present

## 2022-09-04 DIAGNOSIS — G319 Degenerative disease of nervous system, unspecified: Secondary | ICD-10-CM | POA: Diagnosis not present

## 2022-09-04 DIAGNOSIS — T45516A Underdosing of anticoagulants, initial encounter: Secondary | ICD-10-CM | POA: Diagnosis present

## 2022-09-04 DIAGNOSIS — Z1152 Encounter for screening for COVID-19: Secondary | ICD-10-CM | POA: Diagnosis not present

## 2022-09-04 DIAGNOSIS — Z90722 Acquired absence of ovaries, bilateral: Secondary | ICD-10-CM

## 2022-09-04 DIAGNOSIS — I700 Atherosclerosis of aorta: Secondary | ICD-10-CM | POA: Diagnosis not present

## 2022-09-04 DIAGNOSIS — Z66 Do not resuscitate: Secondary | ICD-10-CM

## 2022-09-04 DIAGNOSIS — Z79891 Long term (current) use of opiate analgesic: Secondary | ICD-10-CM

## 2022-09-04 DIAGNOSIS — R0602 Shortness of breath: Secondary | ICD-10-CM | POA: Diagnosis not present

## 2022-09-04 DIAGNOSIS — J441 Chronic obstructive pulmonary disease with (acute) exacerbation: Secondary | ICD-10-CM | POA: Diagnosis present

## 2022-09-04 DIAGNOSIS — M549 Dorsalgia, unspecified: Secondary | ICD-10-CM | POA: Diagnosis not present

## 2022-09-04 DIAGNOSIS — Z888 Allergy status to other drugs, medicaments and biological substances status: Secondary | ICD-10-CM

## 2022-09-04 DIAGNOSIS — J9811 Atelectasis: Secondary | ICD-10-CM | POA: Diagnosis not present

## 2022-09-04 DIAGNOSIS — F05 Delirium due to known physiological condition: Secondary | ICD-10-CM | POA: Diagnosis present

## 2022-09-04 DIAGNOSIS — R296 Repeated falls: Secondary | ICD-10-CM | POA: Diagnosis present

## 2022-09-04 DIAGNOSIS — Z86718 Personal history of other venous thrombosis and embolism: Secondary | ICD-10-CM

## 2022-09-04 DIAGNOSIS — Z79899 Other long term (current) drug therapy: Secondary | ICD-10-CM

## 2022-09-04 DIAGNOSIS — Z7901 Long term (current) use of anticoagulants: Secondary | ICD-10-CM

## 2022-09-04 DIAGNOSIS — Z90710 Acquired absence of both cervix and uterus: Secondary | ICD-10-CM

## 2022-09-04 LAB — URINALYSIS, ROUTINE W REFLEX MICROSCOPIC
Bilirubin Urine: NEGATIVE
Glucose, UA: NEGATIVE mg/dL
Ketones, ur: NEGATIVE mg/dL
Nitrite: POSITIVE — AB
Protein, ur: 30 mg/dL — AB
Specific Gravity, Urine: 1.017 (ref 1.005–1.030)
pH: 5 (ref 5.0–8.0)

## 2022-09-04 LAB — COMPREHENSIVE METABOLIC PANEL
ALT: 25 U/L (ref 0–44)
AST: 41 U/L (ref 15–41)
Albumin: 2.6 g/dL — ABNORMAL LOW (ref 3.5–5.0)
Alkaline Phosphatase: 67 U/L (ref 38–126)
Anion gap: 6 (ref 5–15)
BUN: 28 mg/dL — ABNORMAL HIGH (ref 8–23)
CO2: 22 mmol/L (ref 22–32)
Calcium: 9 mg/dL (ref 8.9–10.3)
Chloride: 109 mmol/L (ref 98–111)
Creatinine, Ser: 1.22 mg/dL — ABNORMAL HIGH (ref 0.44–1.00)
GFR, Estimated: 44 mL/min — ABNORMAL LOW (ref >60)
Glucose, Bld: 150 mg/dL — ABNORMAL HIGH (ref 70–99)
Potassium: 3.8 mmol/L (ref 3.5–5.1)
Sodium: 137 mmol/L (ref 135–145)
Total Bilirubin: 0.7 mg/dL (ref 0.3–1.2)
Total Protein: 6.7 g/dL (ref 6.5–8.1)

## 2022-09-04 LAB — CBC WITH DIFFERENTIAL/PLATELET
Abs Immature Granulocytes: 0.12 10*3/uL — ABNORMAL HIGH (ref 0.00–0.07)
Basophils Absolute: 0.1 10*3/uL (ref 0.0–0.1)
Basophils Relative: 1 %
Eosinophils Absolute: 0.1 10*3/uL (ref 0.0–0.5)
Eosinophils Relative: 1 %
HCT: 35.2 % — ABNORMAL LOW (ref 36.0–46.0)
Hemoglobin: 11.3 g/dL — ABNORMAL LOW (ref 12.0–15.0)
Immature Granulocytes: 1 %
Lymphocytes Relative: 12 %
Lymphs Abs: 1.9 10*3/uL (ref 0.7–4.0)
MCH: 32.7 pg (ref 26.0–34.0)
MCHC: 32.1 g/dL (ref 30.0–36.0)
MCV: 101.7 fL — ABNORMAL HIGH (ref 80.0–100.0)
Monocytes Absolute: 1.5 10*3/uL — ABNORMAL HIGH (ref 0.1–1.0)
Monocytes Relative: 10 %
Neutro Abs: 12 10*3/uL — ABNORMAL HIGH (ref 1.7–7.7)
Neutrophils Relative %: 75 %
Platelets: 166 10*3/uL (ref 150–400)
RBC: 3.46 MIL/uL — ABNORMAL LOW (ref 3.87–5.11)
RDW: 18.8 % — ABNORMAL HIGH (ref 11.5–15.5)
WBC: 15.7 10*3/uL — ABNORMAL HIGH (ref 4.0–10.5)
nRBC: 0.1 % (ref 0.0–0.2)

## 2022-09-04 LAB — RESP PANEL BY RT-PCR (FLU A&B, COVID) ARPGX2
Influenza A by PCR: NEGATIVE
Influenza B by PCR: NEGATIVE
SARS Coronavirus 2 by RT PCR: NEGATIVE

## 2022-09-04 LAB — LIPASE, BLOOD: Lipase: 26 U/L (ref 11–51)

## 2022-09-04 MED ORDER — LACTATED RINGERS IV BOLUS
1000.0000 mL | Freq: Once | INTRAVENOUS | Status: AC
Start: 2022-09-04 — End: 2022-09-04
  Administered 2022-09-04: 1000 mL via INTRAVENOUS

## 2022-09-04 MED ORDER — ERYTHROMYCIN 5 MG/GM OP OINT
TOPICAL_OINTMENT | Freq: Once | OPHTHALMIC | Status: AC
  Administered 2022-09-05: 1 via OPHTHALMIC
  Filled 2022-09-04: qty 3.5

## 2022-09-04 MED ORDER — SODIUM CHLORIDE 0.9 % IV SOLN
1.0000 g | INTRAVENOUS | Status: DC
  Administered 2022-09-05 – 2022-09-06 (×3): 1 g via INTRAVENOUS
  Filled 2022-09-04 (×3): qty 10

## 2022-09-04 NOTE — Assessment & Plan Note (Signed)
Chronic. 

## 2022-09-04 NOTE — Assessment & Plan Note (Signed)
EDP discussed with ophthalmology. Pt followed by [facility]. Continue Valtrex and e-mycin eye ointment.

## 2022-09-04 NOTE — ED Provider Notes (Signed)
[facility] DEPT Provider Note   CSN: [id number] Arrival date & time: 09/04/22  2831     History Chief Complaint  Patient presents with   Weakness    HPI Cindy Byrd is a 81 y.o. female presenting for multiple concerns.  She has had frequent falls over the last week, confusion, weakness worsening over the past 3 days.  Patient's son at bedside provides much of the history.  He states that she lives with a failure and had been doing well until 3 days ago where she started having subjective fevers, frequent falls going to the restroom frequently. They deny nausea or vomiting, syncope or shortness of breath.  Patient's recorded medical, surgical, social, medication list and allergies were reviewed in the Snapshot window as part of the initial history.   Review of Systems   Review of Systems  Constitutional:  Positive for fatigue. Negative for chills and fever.  HENT:  Negative for ear pain and sore throat.   Eyes:  Negative for pain and visual disturbance.  Respiratory:  Negative for cough and shortness of breath.   Cardiovascular:  Negative for chest pain and palpitations.  Gastrointestinal:  Negative for abdominal pain and vomiting.  Genitourinary:  Negative for dysuria and hematuria.  Musculoskeletal:  Negative for arthralgias and back pain.  Skin:  Negative for color change and rash.  Neurological:  Negative for seizures and syncope.  Psychiatric/Behavioral:  Positive for confusion.   All other systems reviewed and are negative.   Physical Exam Updated Vital Signs BP 133/78   Pulse 91   Temp 98.3 F (36.8 C) (Oral)   Resp (!) 24   Ht 5\' 8"  (1.727 m)   Wt 68 kg   SpO2 97%   BMI 22.81 kg/m  Physical Exam Vitals and nursing note reviewed.  Constitutional:      General: She is not in acute distress.    Appearance: She is well-developed.  HENT:     Head: Normocephalic and atraumatic.  Eyes:     Conjunctiva/sclera: Conjunctivae  normal.  Cardiovascular:     Rate and Rhythm: Normal rate and regular rhythm.     Heart sounds: No murmur heard. Pulmonary:     Effort: Pulmonary effort is normal. No respiratory distress.     Breath sounds: Normal breath sounds.  Abdominal:     General: There is no distension.     Palpations: Abdomen is soft.     Tenderness: There is no abdominal tenderness. There is no right CVA tenderness or left CVA tenderness.  Musculoskeletal:        General: No swelling or tenderness. Normal range of motion.     Cervical back: Neck supple.  Skin:    General: Skin is warm and dry.  Neurological:     General: No focal deficit present.     Mental Status: She is alert and oriented to person, place, and time. Mental status is at baseline.     Cranial Nerves: No cranial nerve deficit.      ED Course/ Medical Decision Making/ A&P Clinical Course as of 09/04/22 2313  Nancy Fetter Sep 04, 2022  2105 Urine results. Eye bolster [CC]    Clinical Course User Index [CC] Tretha Sciara, MD    Procedures Procedures   Medications Ordered in ED Medications  erythromycin ophthalmic ointment (has no administration in time range)  cefTRIAXone (ROCEPHIN) 1 g in sodium chloride 0.9 % 100 mL IVPB (has no administration in time range)  lactated  ringers bolus 1,000 mL (0 mLs Intravenous Stopped 09/04/22 2155)    Medical Decision Making:    Cindy Byrd is a 81 y.o. female who presented to the ED today with AMS/Weakness detailed above.     Patient's presentation is complicated by their history of advanced age.  Patient placed on continuous vitals and telemetry monitoring while in ED which was reviewed periodically.   Complete initial physical exam performed, notably the patient  was HDS in NAD.      Reviewed and confirmed nursing documentation for past medical history, family history, social history.    Initial Assessment:   With the patient's presentation of altered mental status and fatigue, most  likely diagnosis is developing etiology of delirium including metabolic disruption versus infection including urinary tract infection or pneumonia. Other diagnoses were considered including (but not limited to) thyroid disease, endocrinologic emergency, intracranial hemorrhage from her recent falls, spinal injury. These are considered less likely due to history of present illness and physical exam findings.   This is most consistent with an acute life/limb threatening illness complicated by underlying chronic conditions.  Initial Plan:  CT head, CT C-spine to evaluate for traumatic injury Screening labs including CBC and Metabolic panel to evaluate for infectious or metabolic etiology of disease.  Urinalysis with reflex culture ordered to evaluate for UTI or relevant urologic/nephrologic pathology.  CXR to evaluate for structural/infectious intrathoracic pathology.  EKG to evaluate for cardiac pathology. Objective evaluation as below reviewed with plan for close reassessment  Initial Study Results:   Laboratory  All laboratory results reviewed without evidence of clinically relevant pathology.   Accepted includes many bacteria, leukocytosis, nitrate positive urine concerning for urinary tract infection  EKG EKG was reviewed independently. Rate, rhythm, axis, intervals all examined and without medically relevant abnormality. ST segments without concerns for elevations.    Radiology  All images reviewed independently. Agree with radiology report at this time.   CT HEAD WO CONTRAST (5MM)  Result Date: 09/04/2022 CLINICAL DATA:  Trauma EXAM: CT HEAD WITHOUT CONTRAST CT CERVICAL SPINE WITHOUT CONTRAST TECHNIQUE: Multidetector CT imaging of the head and cervical spine was performed following the standard protocol without intravenous contrast. Multiplanar CT image reconstructions of the cervical spine were also generated. RADIATION DOSE REDUCTION: This exam was performed according to the departmental  dose-optimization program which includes automated exposure control, adjustment of the mA and/or kV according to patient size and/or use of iterative reconstruction technique. COMPARISON:  CT head dated 06/09/2022. FINDINGS: CT HEAD FINDINGS Brain: No evidence of acute infarction, hemorrhage, hydrocephalus, extra-axial collection or mass lesion/mass effect. Global cortical atrophy. Subcortical white matter and periventricular small vessel ischemic changes. Vascular: Mild intracranial atherosclerosis. Skull: Normal. Negative for fracture or focal lesion. Sinuses/Orbits: The visualized paranasal sinuses are essentially clear. The mastoid air cells are unopacified. Other: None. CT CERVICAL SPINE FINDINGS Alignment: Straightening of the cervical spine, likely positional. Skull base and vertebrae: No acute fracture. No primary bone lesion or focal pathologic process. Soft tissues and spinal canal: No prevertebral fluid or swelling. No visible canal hematoma. Disc levels: Mild multilevel degenerative changes. Spinal canal is patent. Upper chest: Visualized lung apices are clear. Other: Visualized thyroid is unremarkable. IMPRESSION: No evidence of acute intracranial abnormality. Atrophy with small vessel ischemic changes. No evidence of traumatic injury to the cervical spine. Mild multilevel degenerative changes. Electronically Signed   By: Julian Hy M.D.   On: 09/04/2022 20:50   CT CERVICAL SPINE WO CONTRAST  Result Date: 09/04/2022 CLINICAL DATA:  Trauma EXAM: CT HEAD WITHOUT CONTRAST CT CERVICAL SPINE WITHOUT CONTRAST TECHNIQUE: Multidetector CT imaging of the head and cervical spine was performed following the standard protocol without intravenous contrast. Multiplanar CT image reconstructions of the cervical spine were also generated. RADIATION DOSE REDUCTION: This exam was performed according to the departmental dose-optimization program which includes automated exposure control, adjustment of the mA  and/or kV according to patient size and/or use of iterative reconstruction technique. COMPARISON:  CT head dated 06/09/2022. FINDINGS: CT HEAD FINDINGS Brain: No evidence of acute infarction, hemorrhage, hydrocephalus, extra-axial collection or mass lesion/mass effect. Global cortical atrophy. Subcortical white matter and periventricular small vessel ischemic changes. Vascular: Mild intracranial atherosclerosis. Skull: Normal. Negative for fracture or focal lesion. Sinuses/Orbits: The visualized paranasal sinuses are essentially clear. The mastoid air cells are unopacified. Other: None. CT CERVICAL SPINE FINDINGS Alignment: Straightening of the cervical spine, likely positional. Skull base and vertebrae: No acute fracture. No primary bone lesion or focal pathologic process. Soft tissues and spinal canal: No prevertebral fluid or swelling. No visible canal hematoma. Disc levels: Mild multilevel degenerative changes. Spinal canal is patent. Upper chest: Visualized lung apices are clear. Other: Visualized thyroid is unremarkable. IMPRESSION: No evidence of acute intracranial abnormality. Atrophy with small vessel ischemic changes. No evidence of traumatic injury to the cervical spine. Mild multilevel degenerative changes. Electronically Signed   By: Julian Hy M.D.   On: 09/04/2022 20:50   DG Chest Portable 1 View  Result Date: 09/04/2022 CLINICAL DATA:  Shortness of breath, multiple falls at home over the past few days. EXAM: PORTABLE CHEST 1 VIEW COMPARISON:  05/12/2022. FINDINGS: The heart size and mediastinal contours are within normal limits. There is atherosclerotic calcification of the aorta. A stable right chest port is noted. Mild atelectasis is noted in the left lung. No effusion or pneumothorax. No acute osseous abnormality. IMPRESSION: No active disease. Electronically Signed   By: Brett Fairy M.D.   On: 09/04/2022 20:04     Consults:  Case discussed with hospitalist Dr. Bridgett Larsson who agreed with  need for admission.   Final Assessment and Plan:   On reevaluation, patient's history of present illness and physical exam findings are most consistent with urinary tract infection causing delirium.  She is overall well-appearing in no acute distress at this time.  However given her frequent falls, decompensation of the outpatient setting, I believe patient would benefit from admission for ongoing care and management.  Started on IV ceftriaxone pending culture results at this time.    Clinical Impression:  1. AKI (acute kidney injury) ([place])      Admit   Final Clinical Impression(s) / ED Diagnoses Final diagnoses:  AKI (acute kidney injury) [facility])    Rx / [place] Orders ED Discharge Orders     None         Tretha Sciara, MD 09/04/22 2313

## 2022-09-04 NOTE — Assessment & Plan Note (Signed)
Pt self-discontinued her Xarelto. Stating that it was too expensive for her to afford. She does not wish to restart anticoagulation.

## 2022-09-04 NOTE — Assessment & Plan Note (Signed)
Stable. Not currently exacerbated.

## 2022-09-04 NOTE — ED Triage Notes (Addendum)
Pt to ED via [facility] from home.  Pt has had multiple falls at home over the past few days.  Pt c/o pain in neck after falling yesterday, pt unable to tolerate c-collar per EMS. Pt has shingles on right side of face x 3 months.  Pt A&Ox4, NAD noted on arrival. Pt states she just feels sore all over and is unsure why she fell.  EMS VS 128/61 97% 3LNC HR=86

## 2022-09-04 NOTE — Assessment & Plan Note (Signed)
Stable

## 2022-09-04 NOTE — Assessment & Plan Note (Signed)
Observation medical bed. IV Rocephin 1 gram daily. Await urine culture.

## 2022-09-04 NOTE — H&P (Addendum)
History and Physical    Cindy Byrd ERX:[id number] DOB: 1941-05-09 DOA: 09/04/2022  DOS: the patient was seen and examined on 09/04/2022  PCP: [name], PA-C   Patient coming from: Home  I have personally briefly reviewed patient's old medical records in [place]  CC: falls at home HPI: 81 year old female with a history of metastatic lung cancer to the bone, esophageal cancer, hypertension, A-fib, history of DVT and PE who self continued her Xarelto due to cost of medications presents to the ER via EMS after multiple falls.  She has been seeing ophthalmology at [facility] due to her PEs ophthalmicus and shingles on her face for last 3 months.  Patient states that she uses a walker at home.  She lives by herself but does have a friend staying with her Hassan Rowan.  Denies any fever or chills.  On arrival temp 98.3 heart rate 87 blood pressure 111/66 satting 96% on room air.  Labs show white count of 15.8, hemoglobin 11.3, platelets of 166  Sodium 137, BUN of 28, creatinine 1.22  COVID and flu negative.  UA positive for nitrates, leukocyte esterase, bacteria and white cells.  [facility] hospital[facility] contacted for admission.   ED Course: UA shows nitrites, LE, WBC and bacteria, WBC 15  Review of Systems:  Review of Systems  Constitutional: Negative.   HENT:         Loss of vision out of right eye for 2-3 months  Eyes: Negative.   Respiratory: Negative.    Cardiovascular: Negative.   Gastrointestinal: Negative.   Genitourinary: Negative.   Musculoskeletal:  Positive for falls.  Skin: Negative.   Neurological:  Positive for weakness.  Endo/Heme/Allergies: Negative.   Psychiatric/Behavioral: Negative.    All other systems reviewed and are negative.   Past Medical History:  Diagnosis Date   Barrett's esophagus    Bilateral pulmonary embolism ([place]) 09/07/2016   09/02/16 bilateral pulmonary emboli in context of extensive bilateral lower extremity deep venous  thromboses Assumed hypercoagulability due to non-small cell metastatic lung cancer Lifelong anticoagulation recommended   Bone neoplasm 06/24/2015   Cancer ([place])    metastatic poorly differentiated carcinoma. tumor left groin surgical removal with radiation tx.   Cataract    BILATERAL   Cigarette smoker two packs a day or less    Currently still smoking 2 PPD - Not interested in quitting at this time.   Colitis 2017   Colon polyps    hyperplastic, tubular adenomas, tubulovillous adenoma   Cough, persistent    hx. lung cancer ? primary-being evaluated, unsure of primary site.   Depression 06/24/2015   Diverticulosis    DVT of lower extremity, bilateral ([place]) 09/07/2016   Emphysema of lung ([place])    Endometriosis    Hysterectomy with BSO at age 56 yrs   Esophageal adenocarcinoma ([place]) 08/11/15   intramucosal   Gastritis    GERD (gastroesophageal reflux disease)    H/O: pneumonia    Hiatal hernia    Hyperlipidemia    Hypertension 06/24/2015   likely improved incidental to 40 lbs weight loss from her neoplasm. No Longer taking med for this as of 08-06-15   IBS (irritable bowel syndrome)    Pain    left hip-persistent"tumor of bone"-radiation tx. 10.   Vitamin D deficiency disease     Past Surgical History:  Procedure Laterality Date   ABDOMINAL HYSTERECTOMY     BALLOON DILATION N/A 10/08/2019   Procedure: BALLOON DILATION;  Surgeon: [name], MD;  Location: [facility];  Service: Gastroenterology;  Laterality: N/A;   BARTHOLIN GLAND CYST EXCISION  81 yo ago   Does not want if it was an infected cyst or tumor. Was soon as delivery   BIOPSY  01/02/2019   Procedure: BIOPSY;  Surgeon: [name], MD;  Location: [facility];  Service: Gastroenterology;;   CATARACT EXTRACTION     COLONOSCOPY W/ POLYPECTOMY     multiple times - last done 09/2014 per patient.   ESOPHAGOGASTRODUODENOSCOPY (EGD) WITH PROPOFOL N/A 08/11/2015   Procedure: ESOPHAGOGASTRODUODENOSCOPY (EGD) WITH PROPOFOL;   Surgeon: [name], MD;  Location: [facility];  Service: Gastroenterology;  Laterality: N/A;   ESOPHAGOGASTRODUODENOSCOPY (EGD) WITH PROPOFOL N/A 01/02/2019   Procedure: ESOPHAGOGASTRODUODENOSCOPY (EGD) WITH PROPOFOL;  Surgeon: [name], MD;  Location: [facility];  Service: Gastroenterology;  Laterality: N/A;   ESOPHAGOGASTRODUODENOSCOPY (EGD) WITH PROPOFOL N/A 10/08/2019   Procedure: ESOPHAGOGASTRODUODENOSCOPY (EGD) WITH PROPOFOL;  Surgeon: [name], MD;  Location: [facility];  Service: Gastroenterology;  Laterality: N/A;   FLEXIBLE SIGMOIDOSCOPY N/A 06/24/2017   Procedure: FLEXIBLE SIGMOIDOSCOPY;  Surgeon: Manus Gunning, MD;  Location: [facility];  Service: Gastroenterology;  Laterality: N/A;   GANGLION CYST EXCISION     KNEE ARTHROSCOPY  age about 34 yrs   TONSILLECTOMY     TOTAL ABDOMINAL HYSTERECTOMY W/ BILATERAL SALPINGOOPHORECTOMY  at age 36 yrs   For endometriosis     reports that she quit smoking about 7 years ago. Her smoking use included cigarettes. She has a 60.00 pack-year smoking history. She has never used smokeless tobacco. She reports that she does not drink alcohol and does not use drugs.  Allergies  Allergen Reactions   Penicillins Other (See Comments)    Unknown; childhood allergy Did it involve swelling of the face/tongue/throat, SOB, or low BP? Unknown Did it involve sudden or severe rash/hives, skin peeling, or any reaction on the inside of your mouth or nose? Unknown Did you need to seek medical attention at a hospital or doctor's office? Unknown When did it last happen?      childhood allergy If all above answers are "NO", may proceed with cephalosporin use.    Remeron [Mirtazapine] Other (See Comments)    nightmares   Latex Rash    Family History  Problem Relation Age of Onset   Colon cancer Brother    Colon cancer Brother    Stroke Mother    Colon cancer Father    Emphysema Father        smoked   Breast cancer Daughter 67        ER/PR+ stage II    Prior to Admission medications   Medication Sig Start Date End Date Taking? Authorizing Provider  Biotin 5 MG CAPS Take 5 mg by mouth daily.   Yes [provider]  buPROPion (WELLBUTRIN XL) 150 MG 24 hr tablet Take 1 tablet (150 mg total) by mouth daily. 04/25/22  Yes Abonza, Maritza, PA-C  butalbital-acetaminophen-caffeine (FIORICET) 50-325-40 MG tablet Take 1 tablet by mouth every 6 (six) hours as needed for headache or migraine. 06/09/22 06/09/23 Yes Dykstra, Ellwood Dense, MD  Calcium Citrate-Vitamin D (CALCIUM + D PO) Take 1 tablet by mouth daily.   Yes [provider]  Cyanocobalamin (B-12) 2500 MCG TABS Take 2,500 mcg by mouth daily.   Yes [provider]  escitalopram (LEXAPRO) 20 MG tablet TAKE 1 TABLET BY MOUTH EVERY DAY 02/28/22  Yes Abonza, Maritza, PA-C  fluconazole (DIFLUCAN) 200 MG tablet Take  200 mg by mouth daily.   Yes [provider]  omeprazole (PRILOSEC) 40 MG capsule TAKE 1 CAPSULE BY MOUTH EVERY DAY BEFORE BREAKFAST 02/28/22  Yes [name], MD  ondansetron (ZOFRAN-ODT) 4 MG disintegrating tablet Take 1 tablet (4 mg total) by mouth every 8 (eight) hours as needed for nausea or vomiting. 05/12/22  Yes Carmin Muskrat, MD  traZODone (DESYREL) 50 MG tablet TAKE 1 TABLET BY MOUTH EVERYDAY AT BEDTIME Patient taking differently: Take 50 mg by mouth at bedtime. 12/27/21  Yes [name], MD  doxycycline (VIBRA-TABS) 100 MG tablet Take 1 tablet (100 mg total) by mouth 2 (two) times daily. Patient not taking: Reported on 09/04/2022 07/07/22   [name], PA-C  dronabinol (MARINOL) 2.5 MG capsule Take 1 capsule (2.5 mg total) by mouth 2 (two) times daily before a meal. Patient not taking: Reported on 09/04/2022 07/22/22   [name], MD  Fluticasone-Umeclidin-Vilant (TRELEGY ELLIPTA) 200-62.5-25 MCG/ACT AEPB Inhale 1 puff into the lungs daily. Patient not taking: Reported on 09/04/2022 04/25/22   [name], PA-C  HYDROcodone-acetaminophen (NORCO) 5-325 MG tablet Take 1 tablet by mouth every 6 (six) hours as needed for moderate pain or severe pain. Patient not taking: Reported on 09/04/2022 01/04/22   [name], MD  lidocaine-prilocaine (EMLA) cream Apply 1 Application topically as needed. Patient not taking: Reported on 09/04/2022 05/31/22   [name], MD  metoCLOPramide (REGLAN) 10 MG tablet Take 1 tablet (10 mg total) by mouth every 8 (eight) hours as needed for nausea. Patient not taking: Reported on 09/04/2022 06/09/22   Lucrezia Starch, MD  nystatin (MYCOSTATIN) 100000 UNIT/ML suspension Take 5 mLs (500,000 Units total) by mouth 4 (four) times daily. Patient not taking: Reported on 09/04/2022 06/04/21   [name], MD  pregabalin (LYRICA) 75 MG capsule Take 1 capsule (75 mg total) by mouth 2 (two) times daily. Patient not taking: Reported on 09/04/2022 07/22/22   [name], MD  valACYclovir (VALTREX) 500 MG tablet Take 1 tablet (500 mg total) by mouth 2 (two) times daily. Patient not taking: Reported on 09/04/2022 07/22/22   [name], MD  XARELTO 20 MG TABS tablet TAKE 1 TABLET BY MOUTH DAILY WITH SUPPER Patient not taking: Reported on 09/04/2022 02/28/22   [name], MD    Physical Exam: Vitals:   09/04/22 2015 09/04/22 2045 09/04/22 2100 09/04/22 2130  BP: 98/64 (!) 148/64 136/79 133/78  Pulse: 86 89 87 91  Resp: (!) 24 (!) 23 19 (!) 24  Temp:      TempSrc:      SpO2: 97% 95% 95% 97%  Weight:      Height:        Physical Exam Vitals and nursing note reviewed.  Constitutional:      Comments: Chronically ill appearing  HENT:     Head:     Comments: Old looking zoster rash right periorbital/right forehead area. See picture. Cardiovascular:     Rate and Rhythm: Normal rate and regular rhythm.     Pulses: Normal pulses.  Pulmonary:     Effort: Pulmonary effort is normal.     Breath sounds: Normal breath sounds.   Abdominal:     General: Abdomen is flat. Bowel sounds are normal. There is no distension.     Tenderness: There is no abdominal tenderness. There is no guarding or rebound.  Musculoskeletal:     Right lower leg: No edema.  Left lower leg: No edema.  Skin:    General: Skin is warm and dry.     Capillary Refill: Capillary refill takes less than 2 seconds.  Neurological:     Mental Status: She is oriented to person, place, and time.         Labs on Admission: I have personally reviewed following labs and imaging studies  CBC: Recent Labs  Lab 09/04/22 1930  WBC 15.7*  NEUTROABS 12.0*  HGB 11.3*  HCT 35.2*  MCV 101.7*  PLT 811   Basic Metabolic Panel: Recent Labs  Lab 09/04/22 1930  NA 137  K 3.8  CL 109  CO2 22  GLUCOSE 150*  BUN 28*  CREATININE 1.22*  CALCIUM 9.0   GFR: Estimated Creatinine Clearance: 35.9 mL/min (A) (by C-G formula based on SCr of 1.22 mg/dL (H)). Liver Function Tests: Recent Labs  Lab 09/04/22 1930  AST 41  ALT 25  ALKPHOS 67  BILITOT 0.7  PROT 6.7  ALBUMIN 2.6*   Recent Labs  Lab 09/04/22 1930  LIPASE 26   No results for input(s): "AMMONIA" in the last 168 hours. Coagulation Profile: No results for input(s): "INR", "PROTIME" in the last 168 hours. Cardiac Enzymes: No results for input(s): "CKTOTAL", "CKMB", "CKMBINDEX", "TROPONINI", "TROPONINIHS" in the last 168 hours. BNP (last 3 results) No results for input(s): "PROBNP" in the last 8760 hours. HbA1C: No results for input(s): "HGBA1C" in the last 72 hours. CBG: No results for input(s): "GLUCAP" in the last 168 hours. Lipid Profile: No results for input(s): "CHOL", "HDL", "LDLCALC", "TRIG", "CHOLHDL", "LDLDIRECT" in the last 72 hours. Thyroid Function Tests: No results for input(s): "TSH", "T4TOTAL", "FREET4", "T3FREE", "THYROIDAB" in the last 72 hours. Anemia Panel: No results for input(s): "VITAMINB12", "FOLATE", "FERRITIN", "TIBC", "IRON", "RETICCTPCT" in the last  72 hours. Urine analysis:    Component Value Date/Time   COLORURINE YELLOW 09/04/2022 2206   APPEARANCEUR CLEAR 09/04/2022 2206   LABSPEC 1.017 09/04/2022 2206   PHURINE 5.0 09/04/2022 2206   GLUCOSEU NEGATIVE 09/04/2022 2206   HGBUR SMALL (A) 09/04/2022 2206   BILIRUBINUR NEGATIVE 09/04/2022 2206   KETONESUR NEGATIVE 09/04/2022 2206   PROTEINUR 30 (A) 09/04/2022 2206   NITRITE POSITIVE (A) 09/04/2022 2206   LEUKOCYTESUR SMALL (A) 09/04/2022 2206    Radiological Exams on Admission: I have personally reviewed images CT HEAD WO CONTRAST (5MM)  Result Date: 09/04/2022 CLINICAL DATA:  Trauma EXAM: CT HEAD WITHOUT CONTRAST CT CERVICAL SPINE WITHOUT CONTRAST TECHNIQUE: Multidetector CT imaging of the head and cervical spine was performed following the standard protocol without intravenous contrast. Multiplanar CT image reconstructions of the cervical spine were also generated. RADIATION DOSE REDUCTION: This exam was performed according to the departmental dose-optimization program which includes automated exposure control, adjustment of the mA and/or kV according to patient size and/or use of iterative reconstruction technique. COMPARISON:  CT head dated 06/09/2022. FINDINGS: CT HEAD FINDINGS Brain: No evidence of acute infarction, hemorrhage, hydrocephalus, extra-axial collection or mass lesion/mass effect. Global cortical atrophy. Subcortical white matter and periventricular small vessel ischemic changes. Vascular: Mild intracranial atherosclerosis. Skull: Normal. Negative for fracture or focal lesion. Sinuses/Orbits: The visualized paranasal sinuses are essentially clear. The mastoid air cells are unopacified. Other: None. CT CERVICAL SPINE FINDINGS Alignment: Straightening of the cervical spine, likely positional. Skull base and vertebrae: No acute fracture. No primary bone lesion or focal pathologic process. Soft tissues and spinal canal: No prevertebral fluid or swelling. No visible canal  hematoma. Disc levels: Mild multilevel degenerative  changes. Spinal canal is patent. Upper chest: Visualized lung apices are clear. Other: Visualized thyroid is unremarkable. IMPRESSION: No evidence of acute intracranial abnormality. Atrophy with small vessel ischemic changes. No evidence of traumatic injury to the cervical spine. Mild multilevel degenerative changes. Electronically Signed   By: Julian Hy M.D.   On: 09/04/2022 20:50   CT CERVICAL SPINE WO CONTRAST  Result Date: 09/04/2022 CLINICAL DATA:  Trauma EXAM: CT HEAD WITHOUT CONTRAST CT CERVICAL SPINE WITHOUT CONTRAST TECHNIQUE: Multidetector CT imaging of the head and cervical spine was performed following the standard protocol without intravenous contrast. Multiplanar CT image reconstructions of the cervical spine were also generated. RADIATION DOSE REDUCTION: This exam was performed according to the departmental dose-optimization program which includes automated exposure control, adjustment of the mA and/or kV according to patient size and/or use of iterative reconstruction technique. COMPARISON:  CT head dated 06/09/2022. FINDINGS: CT HEAD FINDINGS Brain: No evidence of acute infarction, hemorrhage, hydrocephalus, extra-axial collection or mass lesion/mass effect. Global cortical atrophy. Subcortical white matter and periventricular small vessel ischemic changes. Vascular: Mild intracranial atherosclerosis. Skull: Normal. Negative for fracture or focal lesion. Sinuses/Orbits: The visualized paranasal sinuses are essentially clear. The mastoid air cells are unopacified. Other: None. CT CERVICAL SPINE FINDINGS Alignment: Straightening of the cervical spine, likely positional. Skull base and vertebrae: No acute fracture. No primary bone lesion or focal pathologic process. Soft tissues and spinal canal: No prevertebral fluid or swelling. No visible canal hematoma. Disc levels: Mild multilevel degenerative changes. Spinal canal is patent. Upper  chest: Visualized lung apices are clear. Other: Visualized thyroid is unremarkable. IMPRESSION: No evidence of acute intracranial abnormality. Atrophy with small vessel ischemic changes. No evidence of traumatic injury to the cervical spine. Mild multilevel degenerative changes. Electronically Signed   By: Julian Hy M.D.   On: 09/04/2022 20:50   DG Chest Portable 1 View  Result Date: 09/04/2022 CLINICAL DATA:  Shortness of breath, multiple falls at home over the past few days. EXAM: PORTABLE CHEST 1 VIEW COMPARISON:  05/12/2022. FINDINGS: The heart size and mediastinal contours are within normal limits. There is atherosclerotic calcification of the aorta. A stable right chest port is noted. Mild atelectasis is noted in the left lung. No effusion or pneumothorax. No acute osseous abnormality. IMPRESSION: No active disease. Electronically Signed   By: Brett Fairy M.D.   On: 09/04/2022 20:04    EKG: My personal interpretation of EKG shows: NSR    Assessment/Plan Principal Problem:   Acute cystitis without hematuria Active Problems:   Fall at home, initial encounter   Herpes zoster ophthalmicus of right eye   Atrial fibrillation ([place])   Metastatic lung cancer (metastasis from lung to other site), unspecified laterality ([place])   Malignant neoplasm of distal third of esophagus ([place])   Centrilobular emphysema ([place])   History of pulmonary embolism - pt self discontinued Xarelto without discussing it with her healthcare providers   History of deep vein thrombosis (DVT) of lower extremity   DNR (do not resuscitate)/DNI(Do Not Intubate)   Stage 3b chronic kidney disease (CKD) ([place]) - baseline SCr 1.2-1.5    Assessment and Plan: * Acute cystitis without hematuria Observation medical bed. IV Rocephin 1 gram daily. Await urine culture.  Herpes zoster ophthalmicus of right eye EDP discussed with ophthalmology. Pt followed by [facility]. Continue Valtrex and e-mycin eye ointment.  Fall at  home, initial encounter May be due to UTI. Although pt already walks with a walker at home. PT consult.  Stage  3b chronic kidney disease (CKD) ([facility]) - baseline SCr 1.2-1.5 Stable.  DNR (do not resuscitate)/DNI(Do Not Intubate) Verified with pt that she is a DNR  History of deep vein thrombosis (DVT) of lower extremity Pt self-discontinued her Xarelto. Stating that it was too expensive for her to afford. She does not wish to restart anticoagulation.  History of pulmonary embolism - pt self discontinued Xarelto without discussing it with her healthcare providers Pt self-discontinued her Xarelto. Stating that it was too expensive for her to afford. She does not wish to restart anticoagulation.  Centrilobular emphysema ([facility]) Stable. Not currently exacerbated.  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) Chronic.  Metastatic lung cancer (metastasis from lung to other site), unspecified laterality ([facility]) Chronic.  Atrial fibrillation ([place]) Pt self-discontinued her Xarelto. Stating that it was too expensive for her to afford. She does not wish to restart anticoagulation.   DVT prophylaxis: SQ Heparin Code Status: DNR/DNI(Do NOT Intubate)verified with pt Family Communication: no family at bedside  Disposition Plan: return home  Consults called: none  Admission status: Observation, Med-Surg   Kristopher Oppenheim, DO [facility] 09/04/2022, 11:33 PM

## 2022-09-04 NOTE — Assessment & Plan Note (Signed)
Verified with pt that she is a DNR

## 2022-09-04 NOTE — Subjective & Objective (Signed)
CC: falls at home HPI: 81 year old female with a history of metastatic lung cancer to the bone, esophageal cancer, hypertension, A-fib, history of DVT and PE who self continued her Xarelto due to cost of medications presents to the ER via EMS after multiple falls.  She has been seeing ophthalmology at [facility] due to her PEs ophthalmicus and shingles on her face for last 3 months.  Patient states that she uses a walker at home.  She lives by herself but does have a friend staying with her Hassan Rowan.  Denies any fever or chills.  On arrival temp 98.3 heart rate 87 blood pressure 111/66 satting 96% on room air.  Labs show white count of 15.8, hemoglobin 11.3, platelets of 166  Sodium 137, BUN of 28, creatinine 1.22  COVID and flu negative.  UA positive for nitrates, leukocyte esterase, bacteria and white cells.  [facility] hospitalist contacted for admission.

## 2022-09-04 NOTE — Assessment & Plan Note (Signed)
May be due to UTI. Although pt already walks with a walker at home. PT consult.

## 2022-09-04 NOTE — ED Notes (Signed)
Pt. Son(Cindy Byrd) cell phone number [phone].

## 2022-09-05 DIAGNOSIS — D539 Nutritional anemia, unspecified: Secondary | ICD-10-CM | POA: Diagnosis present

## 2022-09-05 DIAGNOSIS — F17210 Nicotine dependence, cigarettes, uncomplicated: Secondary | ICD-10-CM | POA: Diagnosis present

## 2022-09-05 DIAGNOSIS — J432 Centrilobular emphysema: Secondary | ICD-10-CM | POA: Diagnosis not present

## 2022-09-05 DIAGNOSIS — F419 Anxiety disorder, unspecified: Secondary | ICD-10-CM | POA: Diagnosis present

## 2022-09-05 DIAGNOSIS — I700 Atherosclerosis of aorta: Secondary | ICD-10-CM | POA: Diagnosis present

## 2022-09-05 DIAGNOSIS — H5461 Unqualified visual loss, right eye, normal vision left eye: Secondary | ICD-10-CM | POA: Diagnosis present

## 2022-09-05 DIAGNOSIS — E785 Hyperlipidemia, unspecified: Secondary | ICD-10-CM | POA: Diagnosis present

## 2022-09-05 DIAGNOSIS — B0230 Zoster ocular disease, unspecified: Secondary | ICD-10-CM | POA: Diagnosis present

## 2022-09-05 DIAGNOSIS — J441 Chronic obstructive pulmonary disease with (acute) exacerbation: Secondary | ICD-10-CM | POA: Diagnosis present

## 2022-09-05 DIAGNOSIS — N3000 Acute cystitis without hematuria: Secondary | ICD-10-CM | POA: Diagnosis present

## 2022-09-05 DIAGNOSIS — E872 Acidosis, unspecified: Secondary | ICD-10-CM | POA: Diagnosis present

## 2022-09-05 DIAGNOSIS — I959 Hypotension, unspecified: Secondary | ICD-10-CM | POA: Diagnosis not present

## 2022-09-05 DIAGNOSIS — C7951 Secondary malignant neoplasm of bone: Secondary | ICD-10-CM | POA: Diagnosis present

## 2022-09-05 DIAGNOSIS — W19XXXA Unspecified fall, initial encounter: Secondary | ICD-10-CM | POA: Diagnosis present

## 2022-09-05 DIAGNOSIS — I480 Paroxysmal atrial fibrillation: Secondary | ICD-10-CM | POA: Diagnosis present

## 2022-09-05 DIAGNOSIS — F32A Depression, unspecified: Secondary | ICD-10-CM | POA: Diagnosis present

## 2022-09-05 DIAGNOSIS — N1832 Chronic kidney disease, stage 3b: Secondary | ICD-10-CM | POA: Diagnosis present

## 2022-09-05 DIAGNOSIS — Z1152 Encounter for screening for COVID-19: Secondary | ICD-10-CM | POA: Diagnosis not present

## 2022-09-05 DIAGNOSIS — I4891 Unspecified atrial fibrillation: Secondary | ICD-10-CM | POA: Diagnosis not present

## 2022-09-05 DIAGNOSIS — C155 Malignant neoplasm of lower third of esophagus: Secondary | ICD-10-CM | POA: Diagnosis present

## 2022-09-05 DIAGNOSIS — N390 Urinary tract infection, site not specified: Secondary | ICD-10-CM | POA: Diagnosis present

## 2022-09-05 DIAGNOSIS — E559 Vitamin D deficiency, unspecified: Secondary | ICD-10-CM | POA: Diagnosis present

## 2022-09-05 DIAGNOSIS — I129 Hypertensive chronic kidney disease with stage 1 through stage 4 chronic kidney disease, or unspecified chronic kidney disease: Secondary | ICD-10-CM | POA: Diagnosis present

## 2022-09-05 DIAGNOSIS — F05 Delirium due to known physiological condition: Secondary | ICD-10-CM | POA: Diagnosis present

## 2022-09-05 DIAGNOSIS — Z66 Do not resuscitate: Secondary | ICD-10-CM | POA: Diagnosis present

## 2022-09-05 DIAGNOSIS — E8809 Other disorders of plasma-protein metabolism, not elsewhere classified: Secondary | ICD-10-CM | POA: Diagnosis present

## 2022-09-05 DIAGNOSIS — N179 Acute kidney failure, unspecified: Secondary | ICD-10-CM | POA: Diagnosis not present

## 2022-09-05 LAB — COMPREHENSIVE METABOLIC PANEL
ALT: 23 U/L (ref 0–44)
AST: 34 U/L (ref 15–41)
Albumin: 2.4 g/dL — ABNORMAL LOW (ref 3.5–5.0)
Alkaline Phosphatase: 62 U/L (ref 38–126)
Anion gap: 8 (ref 5–15)
BUN: 23 mg/dL (ref 8–23)
CO2: 20 mmol/L — ABNORMAL LOW (ref 22–32)
Calcium: 8.4 mg/dL — ABNORMAL LOW (ref 8.9–10.3)
Chloride: 108 mmol/L (ref 98–111)
Creatinine, Ser: 1.05 mg/dL — ABNORMAL HIGH (ref 0.44–1.00)
GFR, Estimated: 53 mL/min — ABNORMAL LOW (ref >60)
Glucose, Bld: 117 mg/dL — ABNORMAL HIGH (ref 70–99)
Potassium: 3.7 mmol/L (ref 3.5–5.1)
Sodium: 136 mmol/L (ref 135–145)
Total Bilirubin: 0.5 mg/dL (ref 0.3–1.2)
Total Protein: 6 g/dL — ABNORMAL LOW (ref 6.5–8.1)

## 2022-09-05 LAB — CBC WITH DIFFERENTIAL/PLATELET
Abs Immature Granulocytes: 0.08 10*3/uL — ABNORMAL HIGH (ref 0.00–0.07)
Basophils Absolute: 0.1 10*3/uL (ref 0.0–0.1)
Basophils Relative: 1 %
Eosinophils Absolute: 0.2 10*3/uL (ref 0.0–0.5)
Eosinophils Relative: 1 %
HCT: 30.7 % — ABNORMAL LOW (ref 36.0–46.0)
Hemoglobin: 10.1 g/dL — ABNORMAL LOW (ref 12.0–15.0)
Immature Granulocytes: 1 %
Lymphocytes Relative: 18 %
Lymphs Abs: 2.3 10*3/uL (ref 0.7–4.0)
MCH: 32.9 pg (ref 26.0–34.0)
MCHC: 32.9 g/dL (ref 30.0–36.0)
MCV: 100 fL (ref 80.0–100.0)
Monocytes Absolute: 1.5 10*3/uL — ABNORMAL HIGH (ref 0.1–1.0)
Monocytes Relative: 11 %
Neutro Abs: 9.2 10*3/uL — ABNORMAL HIGH (ref 1.7–7.7)
Neutrophils Relative %: 68 %
Platelets: 156 10*3/uL (ref 150–400)
RBC: 3.07 MIL/uL — ABNORMAL LOW (ref 3.87–5.11)
RDW: 18 % — ABNORMAL HIGH (ref 11.5–15.5)
WBC: 13.3 10*3/uL — ABNORMAL HIGH (ref 4.0–10.5)
nRBC: 0.2 % (ref 0.0–0.2)

## 2022-09-05 MED ORDER — ONDANSETRON HCL 4 MG PO TABS: 4.0000 mg | ORAL_TABLET | Freq: Four times a day (QID) | ORAL | Status: DC | PRN

## 2022-09-05 MED ORDER — SODIUM CHLORIDE 0.9 % IV SOLN: 1.0000 g | INTRAVENOUS | Status: DC

## 2022-09-05 MED ORDER — PANTOPRAZOLE SODIUM 40 MG PO TBEC
40.0000 mg | DELAYED_RELEASE_TABLET | Freq: Every day | ORAL | Status: DC
  Administered 2022-09-05 – 2022-09-12 (×8): 40 mg via ORAL
  Filled 2022-09-05 (×8): qty 1

## 2022-09-05 MED ORDER — ESCITALOPRAM OXALATE 20 MG PO TABS
20.0000 mg | ORAL_TABLET | Freq: Every day | ORAL | Status: DC
  Administered 2022-09-05 – 2022-09-12 (×8): 20 mg via ORAL
  Filled 2022-09-05 (×3): qty 1
  Filled 2022-09-05: qty 2
  Filled 2022-09-05 (×4): qty 1

## 2022-09-05 MED ORDER — ERYTHROMYCIN 5 MG/GM OP OINT
TOPICAL_OINTMENT | Freq: Four times a day (QID) | OPHTHALMIC | Status: DC
  Administered 2022-09-05: 1 via OPHTHALMIC

## 2022-09-05 MED ORDER — LIP MEDEX EX OINT
1.0000 | TOPICAL_OINTMENT | CUTANEOUS | Status: DC | PRN
  Administered 2022-09-05: 1 via TOPICAL
  Filled 2022-09-05 (×2): qty 7

## 2022-09-05 MED ORDER — FLUCONAZOLE 100 MG PO TABS
200.0000 mg | ORAL_TABLET | Freq: Every day | ORAL | Status: DC
  Administered 2022-09-05 – 2022-09-12 (×8): 200 mg via ORAL
  Filled 2022-09-05 (×3): qty 2
  Filled 2022-09-05 (×2): qty 1
  Filled 2022-09-05 (×4): qty 2

## 2022-09-05 MED ORDER — BUPROPION HCL ER (XL) 150 MG PO TB24
150.0000 mg | ORAL_TABLET | Freq: Every day | ORAL | Status: DC
  Administered 2022-09-05 – 2022-09-12 (×8): 150 mg via ORAL
  Filled 2022-09-05 (×8): qty 1

## 2022-09-05 MED ORDER — VALACYCLOVIR HCL 500 MG PO TABS
500.0000 mg | ORAL_TABLET | Freq: Two times a day (BID) | ORAL | Status: DC
  Administered 2022-09-05 – 2022-09-12 (×15): 500 mg via ORAL
  Filled 2022-09-05 (×15): qty 1

## 2022-09-05 MED ORDER — ONDANSETRON HCL 4 MG/2ML IJ SOLN: 4.0000 mg | Freq: Four times a day (QID) | INTRAMUSCULAR | Status: DC | PRN

## 2022-09-05 MED ORDER — ACETAMINOPHEN 650 MG RE SUPP: 650.0000 mg | Freq: Four times a day (QID) | RECTAL | Status: DC | PRN

## 2022-09-05 MED ORDER — ACETAMINOPHEN 325 MG PO TABS
650.0000 mg | ORAL_TABLET | Freq: Four times a day (QID) | ORAL | Status: DC | PRN
  Administered 2022-09-05 – 2022-09-12 (×4): 650 mg via ORAL
  Filled 2022-09-05 (×4): qty 2

## 2022-09-05 MED ORDER — HEPARIN SODIUM (PORCINE) 5000 UNIT/ML IJ SOLN
5000.0000 [IU] | Freq: Three times a day (TID) | INTRAMUSCULAR | Status: DC
  Administered 2022-09-05 – 2022-09-12 (×22): 5000 [IU] via SUBCUTANEOUS
  Filled 2022-09-05 (×22): qty 1

## 2022-09-05 MED ORDER — TRAZODONE HCL 50 MG PO TABS
50.0000 mg | ORAL_TABLET | Freq: Every day | ORAL | Status: DC
  Administered 2022-09-05 – 2022-09-11 (×8): 50 mg via ORAL
  Filled 2022-09-05 (×8): qty 1

## 2022-09-05 NOTE — Progress Notes (Addendum)
PROGRESS NOTE    Cindy Byrd  ZOX:[id number] DOB: [birth year] DOA: 09/04/2022 PCP: [name], PA-C    Brief Narrative:   Cindy Byrd is a 81 y.o. female with past medical history significant for metastatic lung cancer to bone, esophageal cancer, essential hypertension, paroxysmal atrial fibrillation, history of DVT/PE who self discontinued Xarelto due to cost presented to [facility] ED on 10/1 via ambulance complaining of multiple falls, neck pain, and generalized weakness.  Patient was recently diagnosed with herpes zoster ophthalmicus and is being followed by ophthalmology at [facility].  Patient lives alone but has a friend staying with her and utilizes a walker at baseline.  Denies fever/chills.  In the ED, temperature 98.3 F, HR 87, RR 18, BP 111/66, SPO2 96% on room air.  WBC 15.7, hemoglobin 11.3, platelets 166.  Sodium 137, potassium 3.8, chloride 109, CO2 22, BUN 28, creatinine 1.22, glucose 150.  AST 41, ALT 25, total bilirubin 0.7.  Lipase 26.  Influenza A/B PCR negative.  COVID-19 PCR negative.  Urinalysis with small leukocytes, positive nitrite, many bacteria, 11-20 WBCs.  CT head/C-spine without contrast with no evidence of acute intracranial abnormality, atrophy with small vessel ischemic changes, no evidence of traumatic injury to the cervical spine with mild multilevel degenerative changes.  Chest x-ray with no active cardiopulmonary disease process.  EDP consulted [facility] for admission for further evaluation and treatment of generalized weakness, recurrent falls and urinary tract infection.  Assessment & Plan:   Acute cystitis without hematuria Urinalysis with small leukocytes, positive nitrite, many bacteria, 11-20 WBCs.  WBC count elevated on admission 15.7. --WBC 15.7>13.3 -- Urine culture: Pending -- Ceftriaxone 1 g IV every 24 hours -- CBC daily  Herpes zoster ophthalmicus of right eye Follows with [place] [facility], Dr. Gershon Crane.   Continue erythromycin ophthalmologic ointment right eye every 6 hours, Valtrex 500 mg p.o. twice daily.  Outpatient follow-up with ophthalmology.  CKD stage IIIb Baseline creatinine 1.2-1.5, creatinine 1.05; stable. --Cr 1.22>1.02 --Avoid nephrotoxins, renal dose all medications -- Review BMP in a.m.  Hx pulmonary embolism/DVT Patient previously on Xarelto, but has self discontinued this medication stating that it was too expensive for her to afford and she does not wish to restart anticoagulation.  Last CT angiogram chest in the EMR on 02/25/2019 with no pulmonary embolism evident.  Vascular duplex ultrasound 09/03/2016 with acute DVT.  Recommend outpatient follow-up with her PCP/specialist.  Paroxysmal atrial fibrillation Previously on Xarelto, now self discontinued as above.  Emphysema Stable, not currently exacerbated  Depression/anxiety: -- Wellbutrin 150 mg p.o. daily -- Lexapro 20 mg p.o. daily  GERD: Protonix 40 mg p.o. daily  Metastatic non-small cell lung cancer with metastasis to left ilium Adenocarcinoma of the esophagus Follows with medical oncology outpatient, Dr. Irene Limbo.  Currently on active surveillance, Xgeva q12 weeks for bone metastasis and Sandostatin q4 weeks for diarrhea.  Outpatient follow-up with oncology.  Weakness/debility/multiple falls: --PT/OT consultation   DVT prophylaxis: heparin injection 5,000 Units Start: 09/05/22 0600 SCDs Start: 09/05/22 0143    Code Status: DNR Family Communication: No family present at bedside this morning  Disposition Plan:  Level of care: Med-Surg Status is: Inpatient Remains inpatient appropriate because: IV antibiotics, pending urine culture, awaiting PT/OT evaluation    Consultants:  none  Procedures:  none  Antimicrobials:  Ceftriaxone 10/1>>   Subjective: Patient seen examined bedside, resting comfortably.  Remains in ED holding area.  Continues with generalized weakness/fatigue.  No other specific  complaints or concerns  at this time.  Denies headache, no dizziness, no chest pain, no palpitations, no shortness of breath, no abdominal pain, no fever/chills/night sweats, no nausea/vomiting/diarrhea, no focal weakness, no paresthesias.  No acute events overnight per nursing staff.  Objective: Vitals:   09/05/22 0915 09/05/22 1145 09/05/22 1232 09/05/22 1640  BP: 135/74 113/67    Pulse:  87    Resp: (!) 21 (!) 22    Temp:   98.6 F (37 C) 98.5 F (36.9 C)  TempSrc:   Oral Oral  SpO2:  97%    Weight:      Height:       No intake or output data in the 24 hours ending 09/05/22 1655 Filed Weights   09/04/22 1918  Weight: 68 kg    Examination:  Physical Exam: GEN: NAD, alert and oriented x 3, elderly/chronically ill in appearance HEENT: Zoster rash right periorbital/right forehead region as depicted below, NCAT, PERRL, EOMI, sclera clear, MMM PULM: CTAB w/o wheezes/crackles, normal respiratory effort, on room air CV: RRR w/o M/G/R GI: abd soft, NTND, NABS, no R/G/M MSK: no peripheral edema, muscle strength globally intact 5/5 bilateral upper/lower extremities NEURO: CN II-XII intact, no focal deficits, sensation to light touch intact PSYCH: normal mood/affect Integumentary: dry/intact, no rashes or wounds      Data Reviewed: I have personally reviewed following labs and imaging studies  CBC: Recent Labs  Lab 09/04/22 1930 09/05/22 0355  WBC 15.7* 13.3*  NEUTROABS 12.0* 9.2*  HGB 11.3* 10.1*  HCT 35.2* 30.7*  MCV 101.7* 100.0  PLT 166 161   Basic Metabolic Panel: Recent Labs  Lab 09/04/22 1930 09/05/22 0355  NA 137 136  K 3.8 3.7  CL 109 108  CO2 22 20*  GLUCOSE 150* 117*  BUN 28* 23  CREATININE 1.22* 1.05*  CALCIUM 9.0 8.4*   GFR: Estimated Creatinine Clearance: 41.7 mL/min (A) (by C-G formula based on SCr of 1.05 mg/dL (H)). Liver Function Tests: Recent Labs  Lab 09/04/22 1930 09/05/22 0355  AST 41 34  ALT 25 23  ALKPHOS 67 62  BILITOT 0.7  0.5  PROT 6.7 6.0*  ALBUMIN 2.6* 2.4*   Recent Labs  Lab 09/04/22 1930  LIPASE 26   No results for input(s): "AMMONIA" in the last 168 hours. Coagulation Profile: No results for input(s): "INR", "PROTIME" in the last 168 hours. Cardiac Enzymes: No results for input(s): "CKTOTAL", "CKMB", "CKMBINDEX", "TROPONINI" in the last 168 hours. BNP (last 3 results) No results for input(s): "PROBNP" in the last 8760 hours. HbA1C: No results for input(s): "HGBA1C" in the last 72 hours. CBG: No results for input(s): "GLUCAP" in the last 168 hours. Lipid Profile: No results for input(s): "CHOL", "HDL", "LDLCALC", "TRIG", "CHOLHDL", "LDLDIRECT" in the last 72 hours. Thyroid Function Tests: No results for input(s): "TSH", "T4TOTAL", "FREET4", "T3FREE", "THYROIDAB" in the last 72 hours. Anemia Panel: No results for input(s): "VITAMINB12", "FOLATE", "FERRITIN", "TIBC", "IRON", "RETICCTPCT" in the last 72 hours. Sepsis Labs: No results for input(s): "PROCALCITON", "LATICACIDVEN" in the last 168 hours.  Recent Results (from the past 240 hour(s))  Resp Panel by RT-PCR (Flu A&B, Covid) Anterior Nasal Swab     Status: None   Collection Time: 09/04/22  8:45 PM   Specimen: Anterior Nasal Swab  Result Value Ref Range Status   SARS Coronavirus 2 by RT PCR NEGATIVE NEGATIVE Final    Comment: (NOTE) SARS-CoV-2 target nucleic acids are NOT DETECTED.  The SARS-CoV-2 RNA is generally detectable in upper respiratory specimens during  the acute phase of infection. The lowest concentration of SARS-CoV-2 viral copies this assay can detect is 138 copies/mL. A negative result does not preclude SARS-Cov-2 infection and should not be used as the sole basis for treatment or other patient management decisions. A negative result may occur with  improper specimen collection/handling, submission of specimen other than nasopharyngeal swab, presence of viral mutation(s) within the areas targeted by this assay, and  inadequate number of viral copies(<138 copies/mL). A negative result must be combined with clinical observations, patient history, and epidemiological information. The expected result is Negative.  Fact Sheet for Patients:  EntrepreneurPulse.com.au  Fact Sheet for Healthcare Providers:  IncredibleEmployment.be  This test is no t yet approved or cleared by the [place] FDA and  has been authorized for detection and/or diagnosis of SARS-CoV-2 by FDA under an Emergency Use Authorization (EUA). This EUA will remain  in effect (meaning this test can be used) for the duration of the COVID-19 declaration under Section 564(b)(1) of the Act, 21 U.S.C.section 360bbb-3(b)(1), unless the authorization is terminated  or revoked sooner.       Influenza A by PCR NEGATIVE NEGATIVE Final   Influenza B by PCR NEGATIVE NEGATIVE Final    Comment: (NOTE) The Xpert Xpress SARS-CoV-2/FLU/RSV plus assay is intended as an aid in the diagnosis of influenza from Nasopharyngeal swab specimens and should not be used as a sole basis for treatment. Nasal washings and aspirates are unacceptable for Xpert Xpress SARS-CoV-2/FLU/RSV testing.  Fact Sheet for Patients: EntrepreneurPulse.com.au  Fact Sheet for Healthcare Providers: IncredibleEmployment.be  This test is not yet approved or cleared by the [place] FDA and has been authorized for detection and/or diagnosis of SARS-CoV-2 by FDA under an Emergency Use Authorization (EUA). This EUA will remain in effect (meaning this test can be used) for the duration of the COVID-19 declaration under Section 564(b)(1) of the Act, 21 U.S.C. section 360bbb-3(b)(1), unless the authorization is terminated or revoked.  Performed at [facility], [place] [address] [place], [place] [address]          Radiology Studies: CT HEAD WO CONTRAST (5MM)  Result Date:  09/04/2022 CLINICAL DATA:  Trauma EXAM: CT HEAD WITHOUT CONTRAST CT CERVICAL SPINE WITHOUT CONTRAST TECHNIQUE: Multidetector CT imaging of the head and cervical spine was performed following the standard protocol without intravenous contrast. Multiplanar CT image reconstructions of the cervical spine were also generated. RADIATION DOSE REDUCTION: This exam was performed according to the departmental dose-optimization program which includes automated exposure control, adjustment of the mA and/or kV according to patient size and/or use of iterative reconstruction technique. COMPARISON:  CT head dated 06/09/2022. FINDINGS: CT HEAD FINDINGS Brain: No evidence of acute infarction, hemorrhage, hydrocephalus, extra-axial collection or mass lesion/mass effect. Global cortical atrophy. Subcortical white matter and periventricular small vessel ischemic changes. Vascular: Mild intracranial atherosclerosis. Skull: Normal. Negative for fracture or focal lesion. Sinuses/Orbits: The visualized paranasal sinuses are essentially clear. The mastoid air cells are unopacified. Other: None. CT CERVICAL SPINE FINDINGS Alignment: Straightening of the cervical spine, likely positional. Skull base and vertebrae: No acute fracture. No primary bone lesion or focal pathologic process. Soft tissues and spinal canal: No prevertebral fluid or swelling. No visible canal hematoma. Disc levels: Mild multilevel degenerative changes. Spinal canal is patent. Upper chest: Visualized lung apices are clear. Other: Visualized thyroid is unremarkable. IMPRESSION: No evidence of acute intracranial abnormality. Atrophy with small vessel ischemic changes. No evidence of traumatic injury to the cervical spine. Mild multilevel degenerative  changes. Electronically Signed   By: Julian Hy M.D.   On: 09/04/2022 20:50   CT CERVICAL SPINE WO CONTRAST  Result Date: 09/04/2022 CLINICAL DATA:  Trauma EXAM: CT HEAD WITHOUT CONTRAST CT CERVICAL SPINE WITHOUT  CONTRAST TECHNIQUE: Multidetector CT imaging of the head and cervical spine was performed following the standard protocol without intravenous contrast. Multiplanar CT image reconstructions of the cervical spine were also generated. RADIATION DOSE REDUCTION: This exam was performed according to the departmental dose-optimization program which includes automated exposure control, adjustment of the mA and/or kV according to patient size and/or use of iterative reconstruction technique. COMPARISON:  CT head dated 06/09/2022. FINDINGS: CT HEAD FINDINGS Brain: No evidence of acute infarction, hemorrhage, hydrocephalus, extra-axial collection or mass lesion/mass effect. Global cortical atrophy. Subcortical white matter and periventricular small vessel ischemic changes. Vascular: Mild intracranial atherosclerosis. Skull: Normal. Negative for fracture or focal lesion. Sinuses/Orbits: The visualized paranasal sinuses are essentially clear. The mastoid air cells are unopacified. Other: None. CT CERVICAL SPINE FINDINGS Alignment: Straightening of the cervical spine, likely positional. Skull base and vertebrae: No acute fracture. No primary bone lesion or focal pathologic process. Soft tissues and spinal canal: No prevertebral fluid or swelling. No visible canal hematoma. Disc levels: Mild multilevel degenerative changes. Spinal canal is patent. Upper chest: Visualized lung apices are clear. Other: Visualized thyroid is unremarkable. IMPRESSION: No evidence of acute intracranial abnormality. Atrophy with small vessel ischemic changes. No evidence of traumatic injury to the cervical spine. Mild multilevel degenerative changes. Electronically Signed   By: Julian Hy M.D.   On: 09/04/2022 20:50   DG Chest Portable 1 View  Result Date: 09/04/2022 CLINICAL DATA:  Shortness of breath, multiple falls at home over the past few days. EXAM: PORTABLE CHEST 1 VIEW COMPARISON:  05/12/2022. FINDINGS: The heart size and mediastinal  contours are within normal limits. There is atherosclerotic calcification of the aorta. A stable right chest port is noted. Mild atelectasis is noted in the left lung. No effusion or pneumothorax. No acute osseous abnormality. IMPRESSION: No active disease. Electronically Signed   By: Brett Fairy M.D.   On: 09/04/2022 20:04        Scheduled Meds:  buPROPion  150 mg Oral Daily   erythromycin   Right Eye Q6H   escitalopram  20 mg Oral Daily   fluconazole  200 mg Oral Daily   heparin  5,000 Units Subcutaneous Q8H   pantoprazole  40 mg Oral Daily   traZODone  50 mg Oral QHS   valACYclovir  500 mg Oral BID   Continuous Infusions:  cefTRIAXone (ROCEPHIN)  IV Stopped (09/05/22 0220)     LOS: 0 days    Time spent: 52 minutes spent on chart review, discussion with nursing staff, consultants, updating family and interview/physical exam; more than 50% of that time was spent in counseling and/or coordination of care.    Ayo Smoak J [place] DO [facility] Available via Epic secure chat 7am-7pm After these hours, please refer to coverage provider listed on [email or web address] 09/05/2022, 4:55 PM

## 2022-09-06 LAB — BASIC METABOLIC PANEL
Anion gap: 8 (ref 5–15)
BUN: 18 mg/dL (ref 8–23)
CO2: 23 mmol/L (ref 22–32)
Calcium: 8.7 mg/dL — ABNORMAL LOW (ref 8.9–10.3)
Chloride: 109 mmol/L (ref 98–111)
Creatinine, Ser: 0.98 mg/dL (ref 0.44–1.00)
GFR, Estimated: 58 mL/min — ABNORMAL LOW (ref >60)
Glucose, Bld: 101 mg/dL — ABNORMAL HIGH (ref 70–99)
Potassium: 3.6 mmol/L (ref 3.5–5.1)
Sodium: 140 mmol/L (ref 135–145)

## 2022-09-06 LAB — CBC
HCT: 32 % — ABNORMAL LOW (ref 36.0–46.0)
Hemoglobin: 10.5 g/dL — ABNORMAL LOW (ref 12.0–15.0)
MCH: 33 pg (ref 26.0–34.0)
MCHC: 32.8 g/dL (ref 30.0–36.0)
MCV: 100.6 fL — ABNORMAL HIGH (ref 80.0–100.0)
Platelets: 181 10*3/uL (ref 150–400)
RBC: 3.18 MIL/uL — ABNORMAL LOW (ref 3.87–5.11)
RDW: 18 % — ABNORMAL HIGH (ref 11.5–15.5)
WBC: 11.5 10*3/uL — ABNORMAL HIGH (ref 4.0–10.5)
nRBC: 0.3 % — ABNORMAL HIGH (ref 0.0–0.2)

## 2022-09-06 MED ORDER — ORAL CARE MOUTH RINSE: 15.0000 mL | OROMUCOSAL | Status: DC | PRN

## 2022-09-06 MED ORDER — POTASSIUM CHLORIDE CRYS ER 20 MEQ PO TBCR
40.0000 meq | EXTENDED_RELEASE_TABLET | Freq: Once | ORAL | Status: AC
  Administered 2022-09-06: 40 meq via ORAL
  Filled 2022-09-06: qty 2

## 2022-09-06 MED ORDER — OXYCODONE HCL 5 MG PO TABS
5.0000 mg | ORAL_TABLET | Freq: Four times a day (QID) | ORAL | Status: DC | PRN
  Administered 2022-09-06 – 2022-09-08 (×6): 5 mg via ORAL
  Filled 2022-09-06 (×6): qty 1

## 2022-09-06 NOTE — Progress Notes (Addendum)
PROGRESS NOTE    Cindy Byrd  OEV:[id number] DOB: [birth year] DOA: 09/04/2022 PCP: [name], PA-C    Brief Narrative:   Cindy Byrd is a 81 y.o. female with past medical history significant for metastatic lung cancer to bone, esophageal cancer, essential hypertension, paroxysmal atrial fibrillation, history of DVT/PE who self discontinued Xarelto due to cost presented to [facility] ED on 10/1 via ambulance complaining of multiple falls, neck pain, and generalized weakness.  Patient was recently diagnosed with herpes zoster ophthalmicus and is being followed by ophthalmology at [facility].  Patient lives alone but has a friend staying with her and utilizes a walker at baseline.  Denies fever/chills.  In the ED, temperature 98.3 F, HR 87, RR 18, BP 111/66, SPO2 96% on room air.  WBC 15.7, hemoglobin 11.3, platelets 166.  Sodium 137, potassium 3.8, chloride 109, CO2 22, BUN 28, creatinine 1.22, glucose 150.  AST 41, ALT 25, total bilirubin 0.7.  Lipase 26.  Influenza A/B PCR negative.  COVID-19 PCR negative.  Urinalysis with small leukocytes, positive nitrite, many bacteria, 11-20 WBCs.  CT head/C-spine without contrast with no evidence of acute intracranial abnormality, atrophy with small vessel ischemic changes, no evidence of traumatic injury to the cervical spine with mild multilevel degenerative changes.  Chest x-ray with no active cardiopulmonary disease process.  EDP consulted [facility] for admission for further evaluation and treatment of generalized weakness, recurrent falls and urinary tract infection.  Assessment & Plan:   Klebsiella pneumonia UTI Urinalysis with small leukocytes, positive nitrite, many bacteria, 11-20 WBCs.  WBC count elevated on admission 15.7. --WBC 15.7>13.3>11.5 --Urine culture: >100K Klebsiella pneumonia, susceptibilities pending --Ceftriaxone 1 g IV every 24 hours --CBC daily  Herpes zoster ophthalmicus of right eye Follows with [facility] ophthal[facility], Dr. Gershon Crane.  Continue erythromycin ophthalmologic ointment right eye every 6 hours, Valtrex 500 mg p.o. twice daily.  Outpatient follow-up with ophthalmology.  CKD stage IIIb Baseline creatinine 1.2-1.5, creatinine 1.05; stable. --Cr 1.22>1.02>0.98 --Avoid nephrotoxins, renal dose all medications --Review BMP in a.m.  Hx pulmonary embolism/DVT Patient previously on Xarelto, but has self discontinued this medication stating that it was too expensive for her to afford and she does not wish to restart anticoagulation.  Last CT angiogram chest in the EMR on 02/25/2019 with no pulmonary embolism evident.  Vascular duplex ultrasound 09/03/2016 with acute DVT.  Recommend outpatient follow-up with her PCP/specialist.  Paroxysmal atrial fibrillation Previously on Xarelto, now self discontinued as above.  Emphysema Stable, not currently exacerbated  Depression/anxiety: --Wellbutrin 150 mg p.o. daily --Lexapro 20 mg p.o. daily  GERD: Protonix 40 mg p.o. daily  Metastatic non-small cell lung cancer with metastasis to left ilium Adenocarcinoma of the esophagus Follows with medical oncology outpatient, Dr. Irene Limbo.  Currently on active surveillance, Xgeva q12 weeks for bone metastasis and Sandostatin q4 weeks for diarrhea.  Outpatient follow-up with oncology.  Weakness/debility/multiple falls: --PT recommending home health --OT consult: Pending   DVT prophylaxis: heparin injection 5,000 Units Start: 09/05/22 0600 SCDs Start: 09/05/22 0143    Code Status: DNR Family Communication: No family present at bedside this morning, updated patient's son Timmothy Sours via telephone this morning, requesting to talk with Education officer, museum for possible assistance in SNF placement  Disposition Plan:  Level of care: Med-Surg Status is: Inpatient Remains inpatient appropriate because: IV antibiotics, pending urine culture susceptibilities, awaiting OT evaluation, anticipate able to discharge home in  1-2 days; although patient's son requesting SNF placement given her significant decline over  the last week    Consultants:  none  Procedures:  none  Antimicrobials:  Ceftriaxone 10/1>>   Subjective: Patient seen examined bedside, resting comfortably.  Lying in bed, no complaints this morning other than generalized weakness.  Seen by PT with recommendation of home health.  Pending OT consultation.  Urine culture now with Klebsiella pneumonia, awaiting further susceptibilities.  No other specific complaints or concerns at this time.  Denies headache, no dizziness, no chest pain, no palpitations, no shortness of breath, no abdominal pain, no fever/chills/night sweats, no nausea/vomiting/diarrhea, no focal weakness, no paresthesias.  No acute events overnight per nursing staff.  Objective: Vitals:   09/05/22 1943 09/06/22 0010 09/06/22 0500 09/06/22 0510  BP: (!) 145/61 (!) 115/56  (!) 150/75  Pulse: 95 94  (!) 103  Resp: 19 18  19   Temp: 97.9 F (36.6 C) 98.4 F (36.9 C)  98.5 F (36.9 C)  TempSrc: Oral Oral  Oral  SpO2: 99% 97%  98%  Weight:   66.6 kg   Height:        Intake/Output Summary (Last 24 hours) at 09/06/2022 1245 Last data filed at 09/06/2022 0300 Gross per 24 hour  Intake 218 ml  Output --  Net 218 ml   Filed Weights   09/04/22 1918 09/06/22 0500  Weight: 68 kg 66.6 kg    Examination:  Physical Exam: GEN: NAD, alert and oriented x 3, elderly/chronically ill in appearance HEENT: Zoster rash right periorbital/right forehead region as depicted below, NCAT, PERRL, EOMI, sclera clear, MMM PULM: CTAB w/o wheezes/crackles, normal respiratory effort, on room air CV: RRR w/o M/G/R GI: abd soft, NTND, NABS, no R/G/M MSK: no peripheral edema, muscle strength globally intact 5/5 bilateral upper/lower extremities NEURO: CN II-XII intact, no focal deficits, sensation to light touch intact PSYCH: normal mood/affect Integumentary: dry/intact, no rashes or  wounds      Data Reviewed: I have personally reviewed following labs and imaging studies  CBC: Recent Labs  Lab 09/04/22 1930 09/05/22 0355 09/06/22 0401  WBC 15.7* 13.3* 11.5*  NEUTROABS 12.0* 9.2*  --   HGB 11.3* 10.1* 10.5*  HCT 35.2* 30.7* 32.0*  MCV 101.7* 100.0 100.6*  PLT 166 156 703   Basic Metabolic Panel: Recent Labs  Lab 09/04/22 1930 09/05/22 0355 09/06/22 0401  NA 137 136 140  K 3.8 3.7 3.6  CL 109 108 109  CO2 22 20* 23  GLUCOSE 150* 117* 101*  BUN 28* 23 18  CREATININE 1.22* 1.05* 0.98  CALCIUM 9.0 8.4* 8.7*   GFR: Estimated Creatinine Clearance: 44.6 mL/min (by C-G formula based on SCr of 0.98 mg/dL). Liver Function Tests: Recent Labs  Lab 09/04/22 1930 09/05/22 0355  AST 41 34  ALT 25 23  ALKPHOS 67 62  BILITOT 0.7 0.5  PROT 6.7 6.0*  ALBUMIN 2.6* 2.4*   Recent Labs  Lab 09/04/22 1930  LIPASE 26   No results for input(s): "AMMONIA" in the last 168 hours. Coagulation Profile: No results for input(s): "INR", "PROTIME" in the last 168 hours. Cardiac Enzymes: No results for input(s): "CKTOTAL", "CKMB", "CKMBINDEX", "TROPONINI" in the last 168 hours. BNP (last 3 results) No results for input(s): "PROBNP" in the last 8760 hours. HbA1C: No results for input(s): "HGBA1C" in the last 72 hours. CBG: No results for input(s): "GLUCAP" in the last 168 hours. Lipid Profile: No results for input(s): "CHOL", "HDL", "LDLCALC", "TRIG", "CHOLHDL", "LDLDIRECT" in the last 72 hours. Thyroid Function Tests: No results for input(s): "TSH", "T4TOTAL", "FREET4", "  T3FREE", "THYROIDAB" in the last 72 hours. Anemia Panel: No results for input(s): "VITAMINB12", "FOLATE", "FERRITIN", "TIBC", "IRON", "RETICCTPCT" in the last 72 hours. Sepsis Labs: No results for input(s): "PROCALCITON", "LATICACIDVEN" in the last 168 hours.  Recent Results (from the past 240 hour(s))  Resp Panel by RT-PCR (Flu A&B, Covid) Anterior Nasal Swab     Status: None   Collection  Time: 09/04/22  8:45 PM   Specimen: Anterior Nasal Swab  Result Value Ref Range Status   SARS Coronavirus 2 by RT PCR NEGATIVE NEGATIVE Final    Comment: (NOTE) SARS-CoV-2 target nucleic acids are NOT DETECTED.  The SARS-CoV-2 RNA is generally detectable in upper respiratory specimens during the acute phase of infection. The lowest concentration of SARS-CoV-2 viral copies this assay can detect is 138 copies/mL. A negative result does not preclude SARS-Cov-2 infection and should not be used as the sole basis for treatment or other patient management decisions. A negative result may occur with  improper specimen collection/handling, submission of specimen other than nasopharyngeal swab, presence of viral mutation(s) within the areas targeted by this assay, and inadequate number of viral copies(<138 copies/mL). A negative result must be combined with clinical observations, patient history, and epidemiological information. The expected result is Negative.  Fact Sheet for Patients:  EntrepreneurPulse.com.au  Fact Sheet for Healthcare Providers:  IncredibleEmployment.be  This test is no t yet approved or cleared by the [place] FDA and  has been authorized for detection and/or diagnosis of SARS-CoV-2 by FDA under an Emergency Use Authorization (EUA). This EUA will remain  in effect (meaning this test can be used) for the duration of the COVID-19 declaration under Section 564(b)(1) of the Act, 21 U.S.C.section 360bbb-3(b)(1), unless the authorization is terminated  or revoked sooner.       Influenza A by PCR NEGATIVE NEGATIVE Final   Influenza B by PCR NEGATIVE NEGATIVE Final    Comment: (NOTE) The Xpert Xpress SARS-CoV-2/FLU/RSV plus assay is intended as an aid in the diagnosis of influenza from Nasopharyngeal swab specimens and should not be used as a sole basis for treatment. Nasal washings and aspirates are unacceptable for Xpert Xpress  SARS-CoV-2/FLU/RSV testing.  Fact Sheet for Patients: EntrepreneurPulse.com.au  Fact Sheet for Healthcare Providers: IncredibleEmployment.be  This test is not yet approved or cleared by the [place] FDA and has been authorized for detection and/or diagnosis of SARS-CoV-2 by FDA under an Emergency Use Authorization (EUA). This EUA will remain in effect (meaning this test can be used) for the duration of the COVID-19 declaration under Section 564(b)(1) of the Act, 21 U.S.C. section 360bbb-3(b)(1), unless the authorization is terminated or revoked.  Performed at [facility], [place] [address], [place], [place] [address]   Urine Culture     Status: Abnormal (Preliminary result)   Collection Time: 09/04/22 10:06 PM   Specimen: In/Out Cath Urine  Result Value Ref Range Status   Specimen Description   Final    IN/OUT CATH URINE Performed at [facility] [address], [place], [place] [address]    Special Requests   Final    NONE Performed at [facility], [place] [address], [place],  [address]    Culture >=100,000 COLONIES/mL KLEBSIELLA PNEUMONIAE (A)  Final   Report Status PENDING  Incomplete         Radiology Studies: CT HEAD WO CONTRAST (5MM)  Result Date: 09/04/2022 CLINICAL DATA:  Trauma EXAM: CT HEAD WITHOUT CONTRAST CT CERVICAL SPINE WITHOUT CONTRAST  TECHNIQUE: Multidetector CT imaging of the head and cervical spine was performed following the standard protocol without intravenous contrast. Multiplanar CT image reconstructions of the cervical spine were also generated. RADIATION DOSE REDUCTION: This exam was performed according to the departmental dose-optimization program which includes automated exposure control, adjustment of the mA and/or kV according to patient size and/or use of iterative reconstruction technique. COMPARISON:  CT head dated 06/09/2022. FINDINGS: CT  HEAD FINDINGS Brain: No evidence of acute infarction, hemorrhage, hydrocephalus, extra-axial collection or mass lesion/mass effect. Global cortical atrophy. Subcortical white matter and periventricular small vessel ischemic changes. Vascular: Mild intracranial atherosclerosis. Skull: Normal. Negative for fracture or focal lesion. Sinuses/Orbits: The visualized paranasal sinuses are essentially clear. The mastoid air cells are unopacified. Other: None. CT CERVICAL SPINE FINDINGS Alignment: Straightening of the cervical spine, likely positional. Skull base and vertebrae: No acute fracture. No primary bone lesion or focal pathologic process. Soft tissues and spinal canal: No prevertebral fluid or swelling. No visible canal hematoma. Disc levels: Mild multilevel degenerative changes. Spinal canal is patent. Upper chest: Visualized lung apices are clear. Other: Visualized thyroid is unremarkable. IMPRESSION: No evidence of acute intracranial abnormality. Atrophy with small vessel ischemic changes. No evidence of traumatic injury to the cervical spine. Mild multilevel degenerative changes. Electronically Signed   By: Julian Hy M.D.   On: 09/04/2022 20:50   CT CERVICAL SPINE WO CONTRAST  Result Date: 09/04/2022 CLINICAL DATA:  Trauma EXAM: CT HEAD WITHOUT CONTRAST CT CERVICAL SPINE WITHOUT CONTRAST TECHNIQUE: Multidetector CT imaging of the head and cervical spine was performed following the standard protocol without intravenous contrast. Multiplanar CT image reconstructions of the cervical spine were also generated. RADIATION DOSE REDUCTION: This exam was performed according to the departmental dose-optimization program which includes automated exposure control, adjustment of the mA and/or kV according to patient size and/or use of iterative reconstruction technique. COMPARISON:  CT head dated 06/09/2022. FINDINGS: CT HEAD FINDINGS Brain: No evidence of acute infarction, hemorrhage, hydrocephalus, extra-axial  collection or mass lesion/mass effect. Global cortical atrophy. Subcortical white matter and periventricular small vessel ischemic changes. Vascular: Mild intracranial atherosclerosis. Skull: Normal. Negative for fracture or focal lesion. Sinuses/Orbits: The visualized paranasal sinuses are essentially clear. The mastoid air cells are unopacified. Other: None. CT CERVICAL SPINE FINDINGS Alignment: Straightening of the cervical spine, likely positional. Skull base and vertebrae: No acute fracture. No primary bone lesion or focal pathologic process. Soft tissues and spinal canal: No prevertebral fluid or swelling. No visible canal hematoma. Disc levels: Mild multilevel degenerative changes. Spinal canal is patent. Upper chest: Visualized lung apices are clear. Other: Visualized thyroid is unremarkable. IMPRESSION: No evidence of acute intracranial abnormality. Atrophy with small vessel ischemic changes. No evidence of traumatic injury to the cervical spine. Mild multilevel degenerative changes. Electronically Signed   By: Julian Hy M.D.   On: 09/04/2022 20:50   DG Chest Portable 1 View  Result Date: 09/04/2022 CLINICAL DATA:  Shortness of breath, multiple falls at home over the past few days. EXAM: PORTABLE CHEST 1 VIEW COMPARISON:  05/12/2022. FINDINGS: The heart size and mediastinal contours are within normal limits. There is atherosclerotic calcification of the aorta. A stable right chest port is noted. Mild atelectasis is noted in the left lung. No effusion or pneumothorax. No acute osseous abnormality. IMPRESSION: No active disease. Electronically Signed   By: Brett Fairy M.D.   On: 09/04/2022 20:04        Scheduled Meds:  buPROPion  150 mg Oral Daily  erythromycin   Right Eye Q6H   escitalopram  20 mg Oral Daily   fluconazole  200 mg Oral Daily   heparin  5,000 Units Subcutaneous Q8H   pantoprazole  40 mg Oral Daily   traZODone  50 mg Oral QHS   valACYclovir  500 mg Oral BID    Continuous Infusions:  cefTRIAXone (ROCEPHIN)  IV 1 g (09/05/22 2307)     LOS: 1 day    Time spent: 52 minutes spent on chart review, discussion with nursing staff, consultants, updating family and interview/physical exam; more than 50% of that time was spent in counseling and/or coordination of care.    Ahlijah Raia J [place] DO [facility] Available via Epic secure chat 7am-7pm After these hours, please refer to coverage provider listed on [email or web address] 09/06/2022, 12:45 PM

## 2022-09-06 NOTE — Evaluation (Signed)
Occupational Therapy Evaluation Patient Details Name: Cindy Byrd MRN: [id number] DOB: 10-28-1941 Today's Date: 09/06/2022   History of Present Illness 82 yo female admitted with acute cystitis, weakness, repeated falls at home.Hx of shingles, met lung ca, PE, esophageal Ca, Afib, DVT, herpes zoster opthalmicus.   Clinical Impression   This 81 yo female admitted with above presents to acute OT with PLOF of needing intermittent A from friend for basic ADLs depending on how she was feeling, but could do them all herself on good days. Currently she is setup/S-min guard A for all basic ADLs from a RW level. She will continue to benefit from acute OT with follow up [place].      Recommendations for follow up therapy are one component of a multi-disciplinary discharge planning process, led by the attending physician.  Recommendations may be updated based on patient status, additional functional criteria and insurance authorization.   Follow Up Recommendations  Home health OT    Assistance Recommended at Discharge Frequent or constant Supervision/Assistance  Patient can return home with the following A little help with walking and/or transfers;A little help with bathing/dressing/bathroom;Help with stairs or ramp for entrance;Assistance with cooking/housework;Assist for transportation    Functional Status Assessment  Patient has had a recent decline in their functional status and demonstrates the ability to make significant improvements in function in a reasonable and predictable amount of time.  Equipment Recommendations  None recommended by OT       Precautions / Restrictions Precautions Precautions: Fall Precaution Comments: repeated falls at home prior to this admission Restrictions Weight Bearing Restrictions: No      Mobility Bed Mobility Overal bed mobility: Modified Independent (HOB up)                  Transfers Overall transfer level: Needs assistance Equipment  used: Rolling walker (2 wheels) Transfers: Sit to/from Stand Sit to Stand: Min guard                  Balance Overall balance assessment: Needs assistance Sitting-balance support: No upper extremity supported, Feet supported Sitting balance-Leahy Scale: Good     Standing balance support: Reliant on assistive device for balance, Bilateral upper extremity supported, During functional activity Standing balance-Leahy Scale: Poor                             ADL either performed or assessed with clinical judgement   ADL Overall ADL's : Needs assistance/impaired Eating/Feeding: Independent;Sitting   Grooming: Set up;Sitting   Upper Body Bathing: Set up;Sitting   Lower Body Bathing: Min guard;Sit to/from stand   Upper Body Dressing : Set up;Sitting   Lower Body Dressing: Min guard;Sit to/from stand   Toilet Transfer: Min guard;Ambulation;Rolling walker (2 wheels) Toilet Transfer Details (indicate cue type and reason): simulated sit<>stand at EOB, step in place, sit back down on bed Toileting- Clothing Manipulation and Hygiene: Min guard;Sit to/from stand               Vision Patient Visual Report: No change from baseline              Pertinent Vitals/Pain Pain Assessment Pain Assessment: 0-10 Pain Score: 10-Worst pain ever Pain Location: face/eye Pain Descriptors / Indicators: Grimacing, Moaning Pain Intervention(s): Monitored during session (reports no pain meds make it any better)     Hand Dominance Right   Extremity/Trunk Assessment Upper Extremity Assessment Upper Extremity Assessment: Overall WFL for tasks assessed  Communication Communication Communication: No difficulties   Cognition Arousal/Alertness: Awake/alert Behavior During Therapy: WFL for tasks assessed/performed Overall Cognitive Status: Within Functional Limits for tasks assessed                                                  Home  Living Family/patient expects to be discharged to:: Private residence Living Arrangements: Non-relatives/Friends Available Help at Discharge: Friend(s);Available 24 hours/day Type of Home: House Home Access: Stairs to enter [employer] of Steps: 4 Entrance Stairs-Rails: Right Home Layout: One level     Bathroom Shower/Tub: Corporate investment banker: Standard     Home Equipment: Marine scientist - single point          Prior Functioning/Environment Prior Level of Function : Needs assist       Physical Assist : ADLs (physical)       ADLs Comments: friend helps with "everything" prn        OT Problem List: Decreased activity tolerance;Impaired balance (sitting and/or standing);Pain      OT Treatment/Interventions: Self-care/ADL training;DME and/or AE instruction;Patient/family education;Balance training    OT Goals(Current goals can be found in the care plan section) Acute Rehab OT Goals Patient Stated Goal: for right side of face to stop hurting; go home OT Goal Formulation: With patient Time For Goal Achievement: 09/20/22 Potential to Achieve Goals: Good  OT Frequency: Min 2X/week       AM-PAC OT "6 Clicks" Daily Activity     Outcome Measure Help from another person eating meals?: None Help from another person taking care of personal grooming?: A Little Help from another person toileting, which includes using toliet, bedpan, or urinal?: A Little Help from another person bathing (including washing, rinsing, drying)?: A Little Help from another person to put on and taking off regular upper body clothing?: A Little Help from another person to put on and taking off regular lower body clothing?: A Little 6 Click Score: 19   End of Session Equipment Utilized During Treatment: Rolling walker (2 wheels)  Activity Tolerance: Patient limited by fatigue;Patient limited by pain Patient left: in bed;with call bell/phone within reach;with bed  alarm set  OT Visit Diagnosis: Unsteadiness on feet (R26.81);Other abnormalities of gait and mobility (R26.89);Muscle weakness (generalized) (M62.81);Pain Pain - part of body:  (right side of face and right eye)                Time: 2993-7169 OT Time Calculation (min): 13 min Charges:  OT General Charges $OT Visit: 1 Visit OT Evaluation $OT Eval Moderate Complexity: [place], OTR/L Acute [employer] [facility] [phone] Office [phone]    Almon Register 09/06/2022, 6:38 PM

## 2022-09-06 NOTE — Progress Notes (Signed)
Chaplain engaged in an initial visit with Pamala Hurry.  Chaplain worked to honor consult for an Scientist, physiological but Demiana voiced that she was not up for it today.  Chaplain offered support and to follow-up tomorrow.    Chaplain left paperwork in the room and let Katasha know how to contact her when ready.     09/06/22 1300  Clinical Encounter Type  Visited With Patient  Visit Type Initial;Social support  Spiritual Encounters  Spiritual Needs Literature;Brochure  Stress Factors  Patient Stress Factors Exhausted

## 2022-09-06 NOTE — Plan of Care (Signed)
  Problem: Clinical Measurements: Goal: Diagnostic test results will improve Outcome: Progressing   Problem: Activity: Goal: Risk for activity intolerance will decrease Outcome: Progressing   Problem: Pain Managment: Goal: General experience of comfort will improve Outcome: Progressing   Problem: Safety: Goal: Ability to remain free from injury will improve Outcome: Progressing

## 2022-09-06 NOTE — Evaluation (Signed)
Physical Therapy Evaluation Patient Details Name: Cindy Byrd MRN: [id number] DOB: 04-04-1941 Today's Date: 09/06/2022  History of Present Illness  81 yo female admitted with acute cystitis, weakness, repeated falls at home.Hx of shingles, met lung ca, PE, esophageal Ca, Afib, DVT, herpes zoster opthalmicus.  Clinical Impression  On eval, pt was Min guard A for mobility. She walked to and from the bathroom without LE buckling or LOB with use of a RW. O2 93% on RA. Dyspnea 2/4 with minimal activity. Pt presents with general weakness, decreased activity tolerance, and impaired gait and balance. Discussed d/c plan-pt stated she has a friend that is staying with her. She stated said friend can be with her all the time and she is able to physically assist her as needed. Pt stated she plans to return home at discharge. PT recommendation is for HHPT f/u. Will plan to follow and progress activity as safely able during this hospital stay.        Recommendations for follow up therapy are one component of a multi-disciplinary discharge planning process, led by the attending physician.  Recommendations may be updated based on patient status, additional functional criteria and insurance authorization.  Follow Up Recommendations Home health PT      Assistance Recommended at Discharge Frequent or constant Supervision/Assistance  Patient can return home with the following  A little help with walking and/or transfers;A little help with bathing/dressing/bathroom;Assistance with cooking/housework;Assist for transportation;Help with stairs or ramp for entrance    Equipment Recommendations None recommended by PT  Recommendations for Other Services  OT consult    Functional Status Assessment Patient has had a recent decline in their functional status and demonstrates the ability to make significant improvements in function in a reasonable and predictable amount of time.     Precautions / Restrictions  Precautions Precautions: Fall Precaution Comments: repeated falls at home prior to this admission Restrictions Weight Bearing Restrictions: No      Mobility  Bed Mobility Overal bed mobility: Needs Assistance Bed Mobility: Supine to Sit     Supine to sit: Supervision, HOB elevated     General bed mobility comments: Supv for safety. Increased time.    Transfers Overall transfer level: Needs assistance Equipment used: Rolling walker (2 wheels) Transfers: Sit to/from Stand Sit to Stand: Min guard           General transfer comment: Close Min guard A for safety. Increased time. Cues provided. Stood x 1 from bed and x 1 from bsc over toilet    Ambulation/Gait Ambulation/Gait assistance: Min guard Gait Distance (Feet): 15 Feet (x2) Assistive device: Rolling walker (2 wheels) Gait Pattern/deviations: Step-through pattern, Decreased stride length       General Gait Details: Close Min guard A. No buckling or LOB observed with RW use. Dyspnea 2/4. O2 93% on RA.  Stairs            Wheelchair Mobility    Modified Rankin (Stroke Patients Only)       Balance Overall balance assessment: Needs assistance         Standing balance support: Reliant on assistive device for balance, Bilateral upper extremity supported, During functional activity                                 Pertinent Vitals/Pain Pain Assessment Pain Assessment: 0-10 Pain Score: 10-Worst pain ever Pain Location: face/eye Pain Descriptors / Indicators: Moaning Pain Intervention(s): Monitored during  session    Home Living Family/patient expects to be discharged to:: Private residence Living Arrangements: Non-relatives/Friends Available Help at Discharge: Family Type of Home: House Home Access: Stairs to enter Entrance Stairs-Rails: Right Entrance Stairs-Number of Steps: 4   Home Layout: One level Home Equipment: Marine scientist - single point      Prior Function                Mobility Comments: uses RW PRN ADLs Comments: friend helps with "everything"     Hand Dominance        Extremity/Trunk Assessment   Upper Extremity Assessment Upper Extremity Assessment: Generalized weakness    Lower Extremity Assessment Lower Extremity Assessment: Generalized weakness    Cervical / Trunk Assessment Cervical / Trunk Assessment: Normal  Communication   Communication: No difficulties  Cognition Arousal/Alertness: Awake/alert Behavior During Therapy: WFL for tasks assessed/performed Overall Cognitive Status: Within Functional Limits for tasks assessed                                          General Comments      Exercises     Assessment/Plan    PT Assessment Patient needs continued PT services  PT Problem List Decreased strength;Decreased mobility;Decreased activity tolerance;Decreased balance;Decreased knowledge of use of DME;Pain       PT Treatment Interventions DME instruction;Gait training;Therapeutic activities;Therapeutic exercise;Patient/family education;Balance training;Functional mobility training    PT Goals (Current goals can be found in the Care Plan section)  Acute Rehab PT Goals Patient Stated Goal: return home PT Goal Formulation: With patient Time For Goal Achievement: 09/20/22 Potential to Achieve Goals: Good    Frequency Min 3X/week     Co-evaluation               AM-PAC PT "6 Clicks" Mobility  Outcome Measure Help needed turning from your back to your side while in a flat bed without using bedrails?: A Little Help needed moving from lying on your back to sitting on the side of a flat bed without using bedrails?: A Little Help needed moving to and from a bed to a chair (including a wheelchair)?: A Little Help needed standing up from a chair using your arms (e.g., wheelchair or bedside chair)?: A Little Help needed to walk in hospital room?: A Little Help needed climbing 3-5 steps with  a railing? : A Little 6 Click Score: 18    End of Session Equipment Utilized During Treatment: Gait belt Activity Tolerance: Patient tolerated treatment well Patient left: in chair;with call bell/phone within reach;with chair alarm set   PT Visit Diagnosis: Muscle weakness (generalized) (M62.81);Difficulty in walking, not elsewhere classified (R26.2);History of falling (Z91.81);Repeated falls (R29.6)    Time: 9476-5465 PT Time Calculation (min) (ACUTE ONLY): 34 min   Charges:   PT Evaluation $PT Eval Moderate Complexity: 1 Mod PT Treatments $Gait Training: 8-22 mins         Doreatha Massed, PT Acute Rehabilitation  Office: [phone] Pager: [phone]

## 2022-09-07 DIAGNOSIS — N390 Urinary tract infection, site not specified: Secondary | ICD-10-CM

## 2022-09-07 DIAGNOSIS — I4891 Unspecified atrial fibrillation: Secondary | ICD-10-CM | POA: Diagnosis not present

## 2022-09-07 DIAGNOSIS — J432 Centrilobular emphysema: Secondary | ICD-10-CM | POA: Diagnosis not present

## 2022-09-07 DIAGNOSIS — N3000 Acute cystitis without hematuria: Secondary | ICD-10-CM | POA: Diagnosis not present

## 2022-09-07 LAB — URINE CULTURE: Culture: >=100000 — AB

## 2022-09-07 LAB — CBC
HCT: 33 % — ABNORMAL LOW (ref 36.0–46.0)
Hemoglobin: 10.3 g/dL — ABNORMAL LOW (ref 12.0–15.0)
MCH: 33.4 pg (ref 26.0–34.0)
MCHC: 31.2 g/dL (ref 30.0–36.0)
MCV: 107.1 fL — ABNORMAL HIGH (ref 80.0–100.0)
Platelets: 222 10*3/uL (ref 150–400)
RBC: 3.08 MIL/uL — ABNORMAL LOW (ref 3.87–5.11)
RDW: 18.4 % — ABNORMAL HIGH (ref 11.5–15.5)
WBC: 11.3 10*3/uL — ABNORMAL HIGH (ref 4.0–10.5)
nRBC: 0.3 % — ABNORMAL HIGH (ref 0.0–0.2)

## 2022-09-07 LAB — BASIC METABOLIC PANEL
Anion gap: 11 (ref 5–15)
BUN: 14 mg/dL (ref 8–23)
CO2: 20 mmol/L — ABNORMAL LOW (ref 22–32)
Calcium: 8.3 mg/dL — ABNORMAL LOW (ref 8.9–10.3)
Chloride: 104 mmol/L (ref 98–111)
Creatinine, Ser: 1.03 mg/dL — ABNORMAL HIGH (ref 0.44–1.00)
GFR, Estimated: 54 mL/min — ABNORMAL LOW (ref >60)
Glucose, Bld: 76 mg/dL (ref 70–99)
Potassium: 4 mmol/L (ref 3.5–5.1)
Sodium: 135 mmol/L (ref 135–145)

## 2022-09-07 LAB — MAGNESIUM: Magnesium: 1.7 mg/dL (ref 1.7–2.4)

## 2022-09-07 MED ORDER — MAGNESIUM SULFATE 2 GM/50ML IV SOLN
2.0000 g | Freq: Once | INTRAVENOUS | Status: AC
  Administered 2022-09-07: 2 g via INTRAVENOUS
  Filled 2022-09-07: qty 50

## 2022-09-07 MED ORDER — CEFAZOLIN SODIUM-DEXTROSE 1-4 GM/50ML-% IV SOLN
1.0000 g | Freq: Three times a day (TID) | INTRAVENOUS | Status: AC
  Administered 2022-09-07 – 2022-09-09 (×7): 1 g via INTRAVENOUS
  Filled 2022-09-07 (×8): qty 50

## 2022-09-07 MED ORDER — CEFAZOLIN SODIUM-DEXTROSE 1-4 GM/50ML-% IV SOLN
1.0000 g | Freq: Two times a day (BID) | INTRAVENOUS | Status: DC
  Filled 2022-09-07: qty 50

## 2022-09-07 NOTE — Progress Notes (Signed)
PROGRESS NOTE    Cindy Byrd  LSL:[id number] DOB: Jun 11, 1941 DOA: 09/04/2022 PCP: [name], PA-C   Brief Narrative:  Cindy Byrd is a 81 y.o. female with past medical history significant for metastatic lung cancer to bone, esophageal cancer, essential hypertension, paroxysmal atrial fibrillation, history of DVT/PE who self discontinued Xarelto due to cost presented to [facility] ED on 10/1 via ambulance complaining of multiple falls, neck pain, and generalized weakness.  Patient was recently diagnosed with herpes zoster ophthalmicus and is being followed by ophthalmology at [facility].  Patient lives alone but has a friend staying with her and utilizes a walker at baseline.  Denies fever/chills.   In the ED, temperature 98.3 F, HR 87, RR 18, BP 111/66, SPO2 96% on room air.  WBC 15.7, hemoglobin 11.3, platelets 166.  Sodium 137, potassium 3.8, chloride 109, CO2 22, BUN 28, creatinine 1.22, glucose 150.  AST 41, ALT 25, total bilirubin 0.7.  Lipase 26.  Influenza A/B PCR negative.  COVID-19 PCR negative.  Urinalysis with small leukocytes, positive nitrite, many bacteria, 11-20 WBCs.  CT head/C-spine without contrast with no evidence of acute intracranial abnormality, atrophy with small vessel ischemic changes, no evidence of traumatic injury to the cervical spine with mild multilevel degenerative changes.  Chest x-ray with no active cardiopulmonary disease process.  EDP consulted [facility] for admission for further evaluation and treatment of generalized weakness, recurrent falls and urinary tract infection.    Assessment and Plan: Klebsiella pneumonia UTI Urinalysis with small leukocytes, positive nitrite, many bacteria, 11-20 WBCs.  WBC count elevated on admission 15.7. --WBC 15.7>13.3>11.5 and now WBC is 11.3 --Urine culture: >100K Klebsiella pneumonia, susceptibilities pending --Ceftriaxone 1 g IV every 24 hours and will de-esclate to IV Cefazolin  --CBC daily   Herpes zoster  ophthalmicus of right eye -Follows with [place] ophthalmology, Dr. Gershon Crane.   -Continue erythromycin ophthalmologic ointment right eye every 6 hours, Valtrex 500 mg p.o. twice daily.   -Outpatient follow-up with ophthalmology.   CKD stage IIIb Metabolic Acidosis Baseline creatinine 1.2-1.5, creatinine 1.05; stable. -Cr 1.22>1.02>0.98 and now BUN/Cr is now 14/1.03 -Patient's CO2 is now 20, AG is 11, and Chloride Level is now 104 -Avoid nephrotoxic medications, contrast dyes, hypotension and dehydration to ensure adequate renal perfusion and will need to renally dose medications -Repeat CMP in a.m.   Hx pulmonary embolism/DVT -Patient previously on Xarelto, but has self discontinued this medication stating that it was too expensive for her to afford and she does not wish to restart anticoagulation. -Last CT angiogram chest in the EMR on 02/25/2019 with no pulmonary embolism evident.  Vascular duplex ultrasound 09/03/2016 with acute DVT.   -Recommend outpatient follow-up with her PCP/specialist.   Paroxysmal atrial fibrillation -Previously on Xarelto, now self discontinued as above.   Emphysema -Stable, not currently exacerbated -Continue monitor respiratory status carefully   Depression/anxiety: -Continue with Wellbutrin 150 mg p.o. daily and Lexapro 20 mg p.o. daily   GERD -Protonix 40 mg p.o. daily   Metastatic non-small cell lung cancer with metastasis to left ilium Adenocarcinoma of the esophagus -Follows with medical oncology outpatient, Dr. Irene Limbo.   -Currently on active surveillance, Xgeva q12 weeks for bone metastasis and Sandostatin q4 weeks for diarrhea.   -Outpatient follow-up with oncology.   Weakness/debility/multiple falls: --PT recommending home health initially but now recommending SNF --OT consult: Recommending home health -Follow-up with [facility] for discharge disposition   DVT prophylaxis: heparin injection 5,000 Units Start: 09/05/22  0600 SCDs  Start: 09/05/22 0143    Code Status: DNR Family Communication: No family currently at bedside  Disposition Plan:  Level of care: Med-Surg Status is: Inpatient Remains inpatient appropriate because: PT is now recommending SNF and will need a safe discharge disposition prior to discharge and will need to see for assistance if patient is agreeable for SNF  Consultants:  None  Procedures:  As above  Antimicrobials:  Anti-infectives (From admission, onward)    Start     Dose/Rate Route Frequency Ordered Stop   09/07/22 2200  ceFAZolin (ANCEF) IVPB 1 g/50 mL premix  Status:  Discontinued        1 g 100 mL/hr over 30 Minutes Intravenous Every 12 hours 09/07/22 1459 09/07/22 1627   09/07/22 2200  ceFAZolin (ANCEF) IVPB 1 g/50 mL premix        1 g 100 mL/hr over 30 Minutes Intravenous Every 8 hours 09/07/22 1627     09/05/22 1000  fluconazole (DIFLUCAN) tablet 200 mg        200 mg Oral Daily 09/05/22 0143     09/05/22 1000  valACYclovir (VALTREX) tablet 500 mg        500 mg Oral 2 times daily 09/05/22 0143     09/05/22 0900  cefTRIAXone (ROCEPHIN) 1 g in sodium chloride 0.9 % 100 mL IVPB  Status:  Discontinued        1 g 200 mL/hr over 30 Minutes Intravenous Every 24 hours 09/05/22 0143 09/05/22 0144   09/04/22 2300  cefTRIAXone (ROCEPHIN) 1 g in sodium chloride 0.9 % 100 mL IVPB  Status:  Discontinued        1 g 200 mL/hr over 30 Minutes Intravenous Every 24 hours 09/04/22 2259 09/07/22 1459       Subjective: Seen and examined at bedside and thinks he is doing little bit better.  No nausea or vomiting.  States that her eyes still hurts.  Denies any lightheadedness or dizziness.  No other concerns or complaints this time.  Objective: Vitals:   09/06/22 2053 09/07/22 0500 09/07/22 0510 09/07/22 1233  BP: 137/83  (!) 151/70 (!) 114/57  Pulse: (!) 101  95 96  Resp: 19  18 18   Temp: 99 F (37.2 C)  99 F (37.2 C) 97.8 F (36.6 C)  TempSrc: Oral  Oral Oral  SpO2: 93%   99% 92%  Weight:  66.5 kg    Height:        Intake/Output Summary (Last 24 hours) at 09/07/2022 1654 Last data filed at 09/07/2022 1600 Gross per 24 hour  Intake 400 ml  Output --  Net 400 ml   Filed Weights   09/04/22 1918 09/06/22 0500 09/07/22 0500  Weight: 68 kg 66.6 kg 66.5 kg   Examination: Physical Exam:  Constitutional: Thin Caucasian female currently no acute distress appears calm Cardiovascular: RRR, no murmurs / rubs / gallops. S1 and S2 auscultated. No extremity edema. Abdomen: Soft, non-tender, non-distended.  Bowel sounds positive.  GU: Deferred. Musculoskeletal: No clubbing / cyanosis of digits/nails. No joint deformity upper and lower extremities.  Skin: No rashes, lesions, ulcers on limited skin evaluation. No induration; Warm and dry.  Neurologic: CN 2-12 grossly intact with no focal deficits. Romberg sign and cerebellar reflexes not assessed.;  Has some right eye erythema given her herpes zoster ophthalmicus Psychiatric: Normal judgment and insight. Alert and oriented x 3. Normal mood and appropriate affect.   Data Reviewed: I have personally reviewed following labs and imaging studies  CBC:  Recent Labs  Lab 09/04/22 1930 09/05/22 0355 09/06/22 0401 09/07/22 0432  WBC 15.7* 13.3* 11.5* 11.3*  NEUTROABS 12.0* 9.2*  --   --   HGB 11.3* 10.1* 10.5* 10.3*  HCT 35.2* 30.7* 32.0* 33.0*  MCV 101.7* 100.0 100.6* 107.1*  PLT 166 156 181 517   Basic Metabolic Panel: Recent Labs  Lab 09/04/22 1930 09/05/22 0355 09/06/22 0401 09/07/22 0432  NA 137 136 140 135  K 3.8 3.7 3.6 4.0  CL 109 108 109 104  CO2 22 20* 23 20*  GLUCOSE 150* 117* 101* 76  BUN 28* 23 18 14   CREATININE 1.22* 1.05* 0.98 1.03*  CALCIUM 9.0 8.4* 8.7* 8.3*  MG  --   --   --  1.7   GFR: Estimated Creatinine Clearance: 42.5 mL/min (A) (by C-G formula based on SCr of 1.03 mg/dL (H)). Liver Function Tests: Recent Labs  Lab 09/04/22 1930 09/05/22 0355  AST 41 34  ALT 25 23   ALKPHOS 67 62  BILITOT 0.7 0.5  PROT 6.7 6.0*  ALBUMIN 2.6* 2.4*   Recent Labs  Lab 09/04/22 1930  LIPASE 26   No results for input(s): "AMMONIA" in the last 168 hours. Coagulation Profile: No results for input(s): "INR", "PROTIME" in the last 168 hours. Cardiac Enzymes: No results for input(s): "CKTOTAL", "CKMB", "CKMBINDEX", "TROPONINI" in the last 168 hours. BNP (last 3 results) No results for input(s): "PROBNP" in the last 8760 hours. HbA1C: No results for input(s): "HGBA1C" in the last 72 hours. CBG: No results for input(s): "GLUCAP" in the last 168 hours. Lipid Profile: No results for input(s): "CHOL", "HDL", "LDLCALC", "TRIG", "CHOLHDL", "LDLDIRECT" in the last 72 hours. Thyroid Function Tests: No results for input(s): "TSH", "T4TOTAL", "FREET4", "T3FREE", "THYROIDAB" in the last 72 hours. Anemia Panel: No results for input(s): "VITAMINB12", "FOLATE", "FERRITIN", "TIBC", "IRON", "RETICCTPCT" in the last 72 hours. Sepsis Labs: No results for input(s): "PROCALCITON", "LATICACIDVEN" in the last 168 hours.  Recent Results (from the past 240 hour(s))  Resp Panel by RT-PCR (Flu A&B, Covid) Anterior Nasal Swab     Status: None   Collection Time: 09/04/22  8:45 PM   Specimen: Anterior Nasal Swab  Result Value Ref Range Status   SARS Coronavirus 2 by RT PCR NEGATIVE NEGATIVE Final    Comment: (NOTE) SARS-CoV-2 target nucleic acids are NOT DETECTED.  The SARS-CoV-2 RNA is generally detectable in upper respiratory specimens during the acute phase of infection. The lowest concentration of SARS-CoV-2 viral copies this assay can detect is 138 copies/mL. A negative result does not preclude SARS-Cov-2 infection and should not be used as the sole basis for treatment or other patient management decisions. A negative result may occur with  improper specimen collection/handling, submission of specimen other than nasopharyngeal swab, presence of viral mutation(s) within the areas  targeted by this assay, and inadequate number of viral copies(<138 copies/mL). A negative result must be combined with clinical observations, patient history, and epidemiological information. The expected result is Negative.  Fact Sheet for Patients:  EntrepreneurPulse.com.au  Fact Sheet for Healthcare Providers:  IncredibleEmployment.be  This test is no t yet approved or cleared by the [place] FDA and  has been authorized for detection and/or diagnosis of SARS-CoV-2 by FDA under an Emergency Use Authorization (EUA). This EUA will remain  in effect (meaning this test can be used) for the duration of the COVID-19 declaration under Section 564(b)(1) of the Act, 21 U.S.C.section 360bbb-3(b)(1), unless the authorization is terminated  or revoked sooner.  Influenza A by PCR NEGATIVE NEGATIVE Final   Influenza B by PCR NEGATIVE NEGATIVE Final    Comment: (NOTE) The Xpert Xpress SARS-CoV-2/FLU/RSV plus assay is intended as an aid in the diagnosis of influenza from Nasopharyngeal swab specimens and should not be used as a sole basis for treatment. Nasal washings and aspirates are unacceptable for Xpert Xpress SARS-CoV-2/FLU/RSV testing.  Fact Sheet for Patients: EntrepreneurPulse.com.au  Fact Sheet for Healthcare Providers: IncredibleEmployment.be  This test is not yet approved or cleared by the [place] FDA and has been authorized for detection and/or diagnosis of SARS-CoV-2 by FDA under an Emergency Use Authorization (EUA). This EUA will remain in effect (meaning this test can be used) for the duration of the COVID-19 declaration under Section 564(b)(1) of the Act, 21 U.S.C. section 360bbb-3(b)(1), unless the authorization is terminated or revoked.  Performed at [facility], [place] [address], [place], [place] [address]   Urine Culture     Status: Abnormal   Collection  Time: 09/04/22 10:06 PM   Specimen: In/Out Cath Urine  Result Value Ref Range Status   Specimen Description   Final    IN/OUT CATH URINE Performed at [facility] [address] [place], [place] [address]    Special Requests   Final    NONE Performed at [facility], [place] [address] [place], [place] [address]    Culture >=100,000 COLONIES/mL KLEBSIELLA PNEUMONIAE (A)  Final   Report Status 09/07/2022 FINAL  Final   Organism ID, Bacteria KLEBSIELLA PNEUMONIAE (A)  Final      Susceptibility   Klebsiella pneumoniae - MIC*    AMPICILLIN >=32 RESISTANT Resistant     CEFAZOLIN <=4 SENSITIVE Sensitive     CEFEPIME <=0.12 SENSITIVE Sensitive     CEFTRIAXONE <=0.25 SENSITIVE Sensitive     CIPROFLOXACIN <=0.25 SENSITIVE Sensitive     GENTAMICIN <=1 SENSITIVE Sensitive     IMIPENEM <=0.25 SENSITIVE Sensitive     NITROFURANTOIN 128 RESISTANT Resistant     TRIMETH/SULFA <=20 SENSITIVE Sensitive     AMPICILLIN/SULBACTAM 16 INTERMEDIATE Intermediate     PIP/TAZO 16 SENSITIVE Sensitive     * >=100,000 COLONIES/mL KLEBSIELLA PNEUMONIAE    Radiology Studies: No results found.  Scheduled Meds:  buPROPion  150 mg Oral Daily   erythromycin   Right Eye Q6H   escitalopram  20 mg Oral Daily   fluconazole  200 mg Oral Daily   heparin  5,000 Units Subcutaneous Q8H   pantoprazole  40 mg Oral Daily   traZODone  50 mg Oral QHS   valACYclovir  500 mg Oral BID   Continuous Infusions:   ceFAZolin (ANCEF) IV      LOS: 2 days   Raiford Noble, DO [facility] Available via Epic secure chat 7am-7pm After these hours, please refer to coverage provider listed on [email or web address] 09/07/2022, 4:54 PM

## 2022-09-07 NOTE — Progress Notes (Signed)
PT Cancellation Note  Patient Details Name: Cindy Byrd MRN: [id number] DOB: 02/18/1941   Cancelled Treatment:    Reason Eval/Treat Not Completed: Pain limiting ability to participate. Pt declined to participate at this time. Will attempt to check back as schedule allows.    [facility]  Office: [phone] Pager: [phone]

## 2022-09-07 NOTE — Plan of Care (Signed)

## 2022-09-07 NOTE — TOC Initial Note (Signed)
Transition of Care [facility]) - Initial/Assessment Note    Patient Details  Name: Cindy Byrd MRN: [id number] Date of Birth: 1941/09/20  Transition of Care [facility]) CM/SW Contact:    Cindy Kaufman, RN Phone Number: 09/07/2022, 11:39 AM  Clinical Narrative:   Spoke with patient who reports she has the following DME: walker, cane prior to admission. This RNCM advised PT has recommended HHPT, patient reports she used [facility] in the past and would like to continue with them.   This RNCM received message from MD on 09/06/22 to contact patient's son. This RNCM spoke with patient's son Cindy Byrd who has concerns about patient going home with HHPT. Patient's son wants MD to speak with him regarding the discharge plan, as he doesn't feel patient is ready for discharge. This RNCM advised patient's son has not been discharged yet and patient has capacity to make her own decisions. Patient's son request that this information about his concerns are not shared with his mother.   This RNCM reached out to PT to have them assess the patient since she does not have anyone at home that can physically take care of her.   TOC will continue to follow.                       Expected Discharge Plan: [place] Barriers to Discharge: Continued Medical Work up   Patient Goals and CMS Choice   CMS Medicare.gov Compare Post Acute Care list provided to:: Patient Choice offered to / list presented to : Patient  Expected Discharge Plan and Services Expected Discharge Plan: [place] In-house Referral: NA Discharge Planning Services: CM Consult Post Acute Care Choice: [place] arrangements for the past 2 months: Single Family Home                 DME Arranged: N/A DME Agency: NA       HH Arranged: PT          Prior Living Arrangements/Services Living arrangements for the past 2 months: Single Family Home Lives with:: Self, Friends Patient language and need for  interpreter reviewed:: Yes Do you feel safe going back to the place where you live?: Yes      Need for Family Participation in Patient Care: Yes (Comment) Care giver support system in place?: Yes (comment) Current home services: DME (cane, walker) Criminal Activity/Legal Involvement Pertinent to Current Situation/Hospitalization: No - Comment as needed  Activities of Daily Living Home Assistive Devices/Equipment: Walker (specify type) (rolling walker) ADL Screening (condition at time of admission) Patient's cognitive ability adequate to safely complete daily activities?: Yes Is the patient deaf or have difficulty hearing?: No Does the patient have difficulty seeing, even when wearing glasses/contacts?: Yes (trouble seeing out of right eye) Does the patient have difficulty concentrating, remembering, or making decisions?: Yes ("siginificant loopiness" per son with this illness) Patient able to express need for assistance with ADLs?: Yes Does the patient have difficulty dressing or bathing?: No Independently performs ADLs?: No Communication: Independent Dressing (OT): Needs assistance Is this a change from baseline?: Pre-admission baseline Grooming: Independent Feeding: Independent Bathing: Needs assistance Is this a change from baseline?: Pre-admission baseline Toileting: Needs assistance Is this a change from baseline?: Pre-admission baseline In/Out Bed: Needs assistance Is this a change from baseline?: Pre-admission baseline Walks in Home: Needs assistance Is this a change from baseline?: Pre-admission baseline Does the patient have difficulty walking or climbing stairs?: Yes Weakness  of Legs: Both Weakness of Arms/Hands: Both  Permission Sought/Granted Permission sought to share information with : Case Manager Permission granted to share information with : Yes, Verbal Permission Granted  Share Information with NAME: Case manager           Emotional Assessment Appearance::  Appears stated age Attitude/Demeanor/Rapport: Gracious Affect (typically observed): Accepting Orientation: : Oriented to  Time, Oriented to Place, Oriented to Self Alcohol / Substance Use: Not Applicable Psych Involvement: No (comment)  Admission diagnosis:  UTI (urinary tract infection) [N39.0] Acute cystitis without hematuria [N30.00] Urinary tract infection with hematuria, site unspecified [N39.0, R31.9] Patient Active Problem List   Diagnosis Date Noted   UTI (urinary tract infection) 09/05/2022   Acute cystitis without hematuria 09/04/2022   History of pulmonary embolism - pt self discontinued Xarelto without discussing it with her healthcare providers 09/04/2022   History of deep vein thrombosis (DVT) of lower extremity 09/04/2022   DNR (do not resuscitate)/DNI(Do Not Intubate) 09/04/2022   Fall at home, initial encounter 09/04/2022   Herpes zoster ophthalmicus of right eye 09/04/2022   Stage 3b chronic kidney disease (CKD) ([place]) - baseline SCr 1.2-1.5 09/04/2022   Centrilobular emphysema ([place]) 06/20/2022   History of esophageal cancer    Esophageal dysphagia    Abnormal esophagram    DOE (dyspnea on exertion) 04/15/2019   Malignant neoplasm of distal third of esophagus ([place]) 01/14/2019   Counseling regarding advance care planning and goals of care 01/14/2019   Abnormal finding on imaging    Esophageal mass    GAD (generalized anxiety disorder) 10/24/2018   Hyperlipidemia 04/23/2018   Chronic kidney disease 04/23/2018   Elevated TSH 04/17/2018   Cough 02/21/2018   Health care maintenance 01/15/2018   Malignant neoplasm of lung ([place])    Colitis 06/23/2017   Left lower quadrant pain 06/23/2017   Metastatic lung cancer (metastasis from lung to other site), unspecified laterality ([place])    Hypocalcemia 09/08/2016   Vitamin D deficiency 09/08/2016   Atrial fibrillation ([place]) 09/08/2016   Protein-calorie malnutrition, severe ([place])    Colitis determined by colorectal biopsy     Palliative care by specialist    Goals of care, counseling/discussion    Advance care planning    Diarrhea    Ulcerative pancolitis without complication ([place])    Cancer ([place])    Pressure injury of skin 09/02/2016   Hypoalbuminemia due to protein-calorie malnutrition ([place]) 07/18/2016   Peripheral edema 07/18/2016   Diarrhea due to drug 05/19/2016   Port catheter in place 04/07/2016   Nicotine addiction 01/19/2016   Barrett's esophagus determined by biopsy 09/04/2015   Primary malignant neoplasm of lung metastatic to other site [facility]) 08/06/2015   Esophageal reflux 08/04/2015   Bone metastasis 06/24/2015   Neoplasm related pain 06/24/2015   Protein calorie malnutrition ([place]) 06/24/2015   Anorexia 06/24/2015   Heavy smoker (more than 20 cigarettes per day) 06/24/2015   Depression 06/24/2015   HTN (hypertension) 06/24/2015   PCP:  [name], PA-C Pharmacy:   [id number] [address], [place] [place] [address] Phone: [phone] Fax: [phone]  [place], [place] [address] Phone: [phone] Fax: [phone]     Social Determinants of Health (SDOH) Interventions    Readmission Risk Interventions     No data to display

## 2022-09-07 NOTE — Progress Notes (Signed)
Physical Therapy Treatment Patient Details Name: Cindy Byrd MRN: [id number] DOB: 12-Nov-1941 Today's Date: 09/07/2022   History of Present Illness 81 yo female admitted with acute cystitis, weakness, repeated falls at home.Hx of shingles, met lung ca, PE, esophageal Ca, Afib, DVT, herpes zoster opthalmicus.    PT Comments    Pt agreeable to working with PT. Once sitting EOB, pt c/o L ankle pain. Pt required 2 attempts to get to standing on today with LOB x 1 back onto bed on 1st attempt. Min A to stand and stabilize with RW. She wasn't able to safely attempt ambulation this session. She was able to take several side steps along the bedside with the RW. Per chart review, pt's son has reported to [facility] that pt will not have assistance in home. Based on today's performance, pt may need to d/c to SNF for ST rehab instead. Will continue to follow and progress activity as safely able. Made RN aware of pt's report of 10/10 L ankle pain.    Recommendations for follow up therapy are one component of a multi-disciplinary discharge planning process, led by the attending physician.  Recommendations may be updated based on patient status, additional functional criteria and insurance authorization.  Follow Up Recommendations  Skilled nursing-short term rehab (<3 hours/day) (if pt will agree. Son has reported to [facility] that pt will not have assistance in home.) Can patient physically be transported by private vehicle: Yes   Assistance Recommended at Discharge Frequent or constant Supervision/Assistance  Patient can return home with the following A little help with walking and/or transfers;A little help with bathing/dressing/bathroom;Assistance with cooking/housework;Assist for transportation;Help with stairs or ramp for entrance   Equipment Recommendations  None recommended by PT    Recommendations for Other Services       Precautions / Restrictions Precautions Precautions: Fall Precaution Comments:  repeated falls at home prior to this admission Restrictions Weight Bearing Restrictions: No     Mobility  Bed Mobility Overal bed mobility: Needs Assistance Bed Mobility: Supine to Sit     Supine to sit: Supervision, HOB elevated     General bed mobility comments: Supv for safety. Increased time.    Transfers Overall transfer level: Needs assistance Equipment used: Rolling walker (2 wheels) Transfers: Sit to/from Stand Sit to Stand: Min assist           General transfer comment: 2 attempts to get to standing on today. Pt with wide BOS and LOB posteriorly back onto bed. Pt c/o significant L ankle pain when trying to stand. Cues for safety, technique, hand/feet placement    Ambulation/Gait Ambulation/Gait assistance: Min assist   Assistive device: Rolling walker (2 wheels)         General Gait Details: Pt was unable to safely ambulate on today 2* L ankle pain, poor balance. She was able to take several sides steps along bedside with RW and Min A from therapist. High fall risk on today.   Stairs             Wheelchair Mobility    Modified Rankin (Stroke Patients Only)       Balance Overall balance assessment: Needs assistance, History of Falls         Standing balance support: Reliant on assistive device for balance, Bilateral upper extremity supported, During functional activity Standing balance-Leahy Scale: Poor  Cognition Arousal/Alertness: Awake/alert Behavior During Therapy: WFL for tasks assessed/performed Overall Cognitive Status: Within Functional Limits for tasks assessed                                          Exercises      General Comments        Pertinent Vitals/Pain Pain Assessment Pain Assessment: 0-10 Pain Score: 10-Worst pain ever Pain Location: L ankle (10/10); face/eye 5/10 Pain Descriptors / Indicators: Grimacing, Moaning Pain Intervention(s): Limited  activity within patient's tolerance, Monitored during session, Premedicated before session    Home Living                          Prior Function            PT Goals (current goals can now be found in the care plan section) Progress towards PT goals: Progressing toward goals    Frequency    Min 3X/week      PT Plan Discharge plan needs to be updated    Co-evaluation              AM-PAC PT "6 Clicks" Mobility   Outcome Measure  Help needed turning from your back to your side while in a flat bed without using bedrails?: A Little Help needed moving from lying on your back to sitting on the side of a flat bed without using bedrails?: A Little Help needed moving to and from a bed to a chair (including a wheelchair)?: A Lot Help needed standing up from a chair using your arms (e.g., wheelchair or bedside chair)?: A Little Help needed to walk in hospital room?: A Lot Help needed climbing 3-5 steps with a railing? : A Lot 6 Click Score: 15    End of Session Equipment Utilized During Treatment: Gait belt Activity Tolerance: Patient limited by pain;Patient limited by fatigue Patient left: in bed;with call bell/phone within reach;with bed alarm set   PT Visit Diagnosis: Muscle weakness (generalized) (M62.81);Difficulty in walking, not elsewhere classified (R26.2);History of falling (Z91.81);Repeated falls (R29.6)     Time: 1433-1450 PT Time Calculation (min) (ACUTE ONLY): 17 min  Charges:  $Therapeutic Activity: 8-22 mins                         Doreatha Massed, PT Acute Rehabilitation  Office: [phone] Pager: [phone]

## 2022-09-08 ENCOUNTER — Inpatient Hospital Stay (HOSPITAL_COMMUNITY): Payer: Medicare Other

## 2022-09-08 DIAGNOSIS — N3000 Acute cystitis without hematuria: Secondary | ICD-10-CM | POA: Diagnosis not present

## 2022-09-08 DIAGNOSIS — N390 Urinary tract infection, site not specified: Secondary | ICD-10-CM | POA: Diagnosis not present

## 2022-09-08 DIAGNOSIS — Z66 Do not resuscitate: Secondary | ICD-10-CM | POA: Diagnosis not present

## 2022-09-08 DIAGNOSIS — J432 Centrilobular emphysema: Secondary | ICD-10-CM | POA: Diagnosis not present

## 2022-09-08 LAB — CBC WITH DIFFERENTIAL/PLATELET
Abs Immature Granulocytes: 0.05 10*3/uL (ref 0.00–0.07)
Basophils Absolute: 0.1 10*3/uL (ref 0.0–0.1)
Basophils Relative: 1 %
Eosinophils Absolute: 0.4 10*3/uL (ref 0.0–0.5)
Eosinophils Relative: 4 %
HCT: 33.8 % — ABNORMAL LOW (ref 36.0–46.0)
Hemoglobin: 11 g/dL — ABNORMAL LOW (ref 12.0–15.0)
Immature Granulocytes: 1 %
Lymphocytes Relative: 21 %
Lymphs Abs: 2.3 10*3/uL (ref 0.7–4.0)
MCH: 33.2 pg (ref 26.0–34.0)
MCHC: 32.5 g/dL (ref 30.0–36.0)
MCV: 102.1 fL — ABNORMAL HIGH (ref 80.0–100.0)
Monocytes Absolute: 1.1 10*3/uL — ABNORMAL HIGH (ref 0.1–1.0)
Monocytes Relative: 10 %
Neutro Abs: 6.9 10*3/uL (ref 1.7–7.7)
Neutrophils Relative %: 63 %
Platelets: 297 10*3/uL (ref 150–400)
RBC: 3.31 MIL/uL — ABNORMAL LOW (ref 3.87–5.11)
RDW: 17.6 % — ABNORMAL HIGH (ref 11.5–15.5)
WBC: 10.9 10*3/uL — ABNORMAL HIGH (ref 4.0–10.5)
nRBC: 0.2 % (ref 0.0–0.2)

## 2022-09-08 LAB — COMPREHENSIVE METABOLIC PANEL
ALT: 14 U/L (ref 0–44)
AST: 19 U/L (ref 15–41)
Albumin: 2.4 g/dL — ABNORMAL LOW (ref 3.5–5.0)
Alkaline Phosphatase: 75 U/L (ref 38–126)
Anion gap: 11 (ref 5–15)
BUN: 18 mg/dL (ref 8–23)
CO2: 24 mmol/L (ref 22–32)
Calcium: 9 mg/dL (ref 8.9–10.3)
Chloride: 104 mmol/L (ref 98–111)
Creatinine, Ser: 1.17 mg/dL — ABNORMAL HIGH (ref 0.44–1.00)
GFR, Estimated: 47 mL/min — ABNORMAL LOW (ref >60)
Glucose, Bld: 92 mg/dL (ref 70–99)
Potassium: 4.9 mmol/L (ref 3.5–5.1)
Sodium: 139 mmol/L (ref 135–145)
Total Bilirubin: 0.7 mg/dL (ref 0.3–1.2)
Total Protein: 6.9 g/dL (ref 6.5–8.1)

## 2022-09-08 LAB — PHOSPHORUS: Phosphorus: 4.4 mg/dL (ref 2.5–4.6)

## 2022-09-08 LAB — MAGNESIUM: Magnesium: 2.3 mg/dL (ref 1.7–2.4)

## 2022-09-08 MED ORDER — SODIUM CHLORIDE 0.9 % IV BOLUS
500.0000 mL | Freq: Once | INTRAVENOUS | Status: AC
  Administered 2022-09-08: 500 mL via INTRAVENOUS

## 2022-09-08 MED ORDER — OXYCODONE HCL 5 MG PO TABS
5.0000 mg | ORAL_TABLET | Freq: Four times a day (QID) | ORAL | Status: DC | PRN
  Administered 2022-09-09: 5 mg via ORAL
  Filled 2022-09-08: qty 1

## 2022-09-08 MED ORDER — SODIUM CHLORIDE 0.9 % IV SOLN: INTRAVENOUS | Status: DC

## 2022-09-08 MED ORDER — GABAPENTIN 100 MG PO CAPS
100.0000 mg | ORAL_CAPSULE | Freq: Three times a day (TID) | ORAL | Status: DC
  Administered 2022-09-08 – 2022-09-12 (×12): 100 mg via ORAL
  Filled 2022-09-08 (×12): qty 1

## 2022-09-08 MED ORDER — NEOMYCIN-POLYMYXIN-DEXAMETH 3.5-10000-0.1 OP OINT
TOPICAL_OINTMENT | Freq: Every day | OPHTHALMIC | Status: DC
  Administered 2022-09-11 (×3): 1 via OPHTHALMIC
  Filled 2022-09-08 (×2): qty 3.5

## 2022-09-08 MED ORDER — OXYCODONE HCL 5 MG PO TABS
5.0000 mg | ORAL_TABLET | Freq: Four times a day (QID) | ORAL | Status: DC | PRN
  Administered 2022-09-08: 10 mg via ORAL
  Filled 2022-09-08: qty 2

## 2022-09-08 NOTE — Progress Notes (Signed)
       CROSS COVER NOTE  NAME: Cindy Byrd MRN: [id number] DOB : 07-18-1941    Date of Service   09/08/2022   HPI/Events of Note   Notified by bedside RN of BP 81/47 (58) and O2 sat 87% on room air.  Patient already receiving IVF.  During bedside assessment patient is noted to be on 2 L oxygen, and is alert and oriented x4. All other vitals stable. She appears to be in no distress and denies any shortness of breath, chest pain, palpitations, dizziness.  She has negative adventitious lung sounds.  Negative edema. Chest x-ray shows mild left basilar atelectasis.  Incentive spirometry use was recommended.    In the past 24 hours, she has had soft BPs ~100-90's . This hypotensive episode may be triggered by infection from Klebsiella pneumonia UTI or could also be related to oxycodone use.  Patient does not present with other signs of sepsis at this moment and is already being treated with antibiotics.  No recent spiked fevers per RN.  Additionally, I will also reduce dose of oxycodone being received    Interventions/ Plan   500 cc NS bolus Cxray --> mild left basilar atelectasis.  Incentive [place], DNP, [place]

## 2022-09-08 NOTE — Progress Notes (Signed)
PROGRESS NOTE    Cindy Byrd  ZCH:[id number] DOB: 19-Feb-1941 DOA: 09/04/2022 PCP: [name], PA-C   Brief Narrative:  Cindy Byrd is a 81 y.o. female with past medical history significant for metastatic lung cancer to bone, esophageal cancer, essential hypertension, paroxysmal atrial fibrillation, history of DVT/PE who self discontinued Xarelto due to cost presented to [facility] ED on 10/1 via ambulance complaining of multiple falls, neck pain, and generalized weakness.  Patient was recently diagnosed with herpes zoster ophthalmicus and is being followed by ophthalmology at [facility].  Patient lives alone but has a friend staying with her and utilizes a walker at baseline.  Denies fever/chills.   In the ED, temperature 98.3 F, HR 87, RR 18, BP 111/66, SPO2 96% on room air.  WBC 15.7, hemoglobin 11.3, platelets 166.  Sodium 137, potassium 3.8, chloride 109, CO2 22, BUN 28, creatinine 1.22, glucose 150.  AST 41, ALT 25, total bilirubin 0.7.  Lipase 26.  Influenza A/B PCR negative.  COVID-19 PCR negative.  Urinalysis with small leukocytes, positive nitrite, many bacteria, 11-20 WBCs.  CT head/C-spine without contrast with no evidence of acute intracranial abnormality, atrophy with small vessel ischemic changes, no evidence of traumatic injury to the cervical spine with mild multilevel degenerative changes.  Chest x-ray with no active cardiopulmonary disease process.  EDP consulted [facility] for admission for further evaluation and treatment of generalized weakness, recurrent falls and urinary tract infection.   Assessment and Plan: Klebsiella pneumonia UTI Urinalysis with small leukocytes, positive nitrite, many bacteria, 11-20 WBCs.  WBC count elevated on admission 15.7. --WBC 15.7>13.3>11.5 and now WBC is 11.3 yesterday and now today is 10.9 --Urine culture: >100K Klebsiella pneumonia, susceptibilities pending --Ceftriaxone 1 g IV every 24 hours and will de-esclate to IV Cefazolin and will  change to p.o. cefdinir at discharge -Continue to monitor CBCs daily   Herpes zoster ophthalmicus of right eye -Follows with [place] ophthalmology, Dr. Gershon Crane.   -Continue erythromycin ophthalmologic ointment right eye every 6 hours however after discussion with ophthalmology they recommended Maxitrol ointment 6 times daily, Valtrex 500 mg p.o. twice daily.   -Outpatient follow-up with ophthalmology and I spoke with Dr. Alanda Slim and he recommends her following up within 1 week. -We will start gabapentin 100 mg 3 times daily   CKD stage IIIb Metabolic Acidosis Baseline creatinine 1.2-1.5, creatinine 1.05; stable. -Cr 1.22>1.02>0.98 and now BUN/Cr is now 14/1.03 -Patient's CO2 is now 24, AG is 11, and Chloride Level is now 104 -We will resume IV fluid hydration with normal saline at 75 MLS per hour for 1 day -Avoid nephrotoxic medications, contrast dyes, hypotension and dehydration to ensure adequate renal perfusion and will need to renally dose medications -Repeat CMP in a.m.   Hx pulmonary embolism/DVT -Patient previously on Xarelto, but has self discontinued this medication stating that it was too expensive for her to afford and she does not wish to restart anticoagulation. -Last CT angiogram chest in the EMR on 02/25/2019 with no pulmonary embolism evident.  Vascular duplex ultrasound 09/03/2016 with acute DVT.   -Recommend outpatient follow-up with her PCP/specialist.   Paroxysmal atrial fibrillation -Previously on Xarelto, now self discontinued as above. -We will need outpatient follow-up with PCP   Emphysema -Stable, not currently exacerbated -Continue monitor respiratory status carefully   Depression/anxiety: -Continue with Wellbutrin 150 mg p.o. daily and Lexapro 20 mg p.o. daily   GERD -Protonix 40 mg p.o. daily   Metastatic non-small cell lung cancer with metastasis  to left ilium Adenocarcinoma of the esophagus -Follows with medical oncology  outpatient, Dr. Irene Limbo.   -Currently on active surveillance, Xgeva q12 weeks for bone metastasis and Sandostatin q4 weeks for diarrhea.   -Outpatient follow-up with oncology.   Weakness/debility/multiple falls: --PT recommending home health initially but now recommending SNF --OT consult: Recommending home health -Follow-up with TOC for discharge disposition and SNF was recommended but she does not want to go to SNF so we will be going home with home health  DVT prophylaxis: heparin injection 5,000 Units Start: 09/05/22 0600 SCDs Start: 09/05/22 0143    Code Status: DNR Family Communication: Discussed with friend at bedside  Disposition Plan:  Level of care: Med-Surg Status is: Inpatient Remains inpatient appropriate because: Renal function slowly went up and she continues to have significant ocular pain.  PT OT recommended SNF and SNF is being pursued but patient does not want to go to SNF now.  Will likely go home with home health in the next 24 to 48 hours   Consultants:  Discussed the case with ophthalmology Dr. Alanda Slim  Procedures:  None  Antimicrobials:  Anti-infectives (From admission, onward)    Start     Dose/Rate Route Frequency Ordered Stop   09/07/22 2200  ceFAZolin (ANCEF) IVPB 1 g/50 mL premix  Status:  Discontinued        1 g 100 mL/hr over 30 Minutes Intravenous Every 12 hours 09/07/22 1459 09/07/22 1627   09/07/22 2200  ceFAZolin (ANCEF) IVPB 1 g/50 mL premix        1 g 100 mL/hr over 30 Minutes Intravenous Every 8 hours 09/07/22 1627     09/05/22 1000  fluconazole (DIFLUCAN) tablet 200 mg        200 mg Oral Daily 09/05/22 0143     09/05/22 1000  valACYclovir (VALTREX) tablet 500 mg        500 mg Oral 2 times daily 09/05/22 0143     09/05/22 0900  cefTRIAXone (ROCEPHIN) 1 g in sodium chloride 0.9 % 100 mL IVPB  Status:  Discontinued        1 g 200 mL/hr over 30 Minutes Intravenous Every 24 hours 09/05/22 0143 09/05/22 0144   09/04/22 2300  cefTRIAXone  (ROCEPHIN) 1 g in sodium chloride 0.9 % 100 mL IVPB  Status:  Discontinued        1 g 200 mL/hr over 30 Minutes Intravenous Every 24 hours 09/04/22 2259 09/07/22 1459       Subjective: Seen and examined at bedside and she is having significant pain in her right eye today.  No nausea or vomiting.  Denies lightheadedness or dizziness.  Feels okay.  No other concerns or complaints at this time.  Objective: Vitals:   09/08/22 0500 09/08/22 0507 09/08/22 1030 09/08/22 1245  BP:  (!) 101/56 (!) 99/54 (!) 99/53  Pulse:  96 92 90  Resp:  19 18 20   Temp:  98.3 F (36.8 C) 98.5 F (36.9 C) 98 F (36.7 C)  TempSrc:  Oral Oral   SpO2:  93% 91% 91%  Weight: 66.6 kg     Height:        Intake/Output Summary (Last 24 hours) at 09/08/2022 1539 Last data filed at 09/08/2022 1525 Gross per 24 hour  Intake 857.16 ml  Output 400 ml  Net 457.16 ml   Filed Weights   09/06/22 0500 09/07/22 0500 09/08/22 0500  Weight: 66.6 kg 66.5 kg 66.6 kg   Examination: Physical Exam:  Constitutional:  Thin chronically ill-appearing Caucasian female with right eye redness and slight discharge Respiratory: Diminished to auscultation bilaterally, no wheezing, rales, rhonchi or crackles. Normal respiratory effort and patient is not tachypenic. No accessory muscle use.  Unlabored breathing Cardiovascular: RRR, no murmurs / rubs / gallops. S1 and S2 auscultated.  Abdomen: Soft, non-tender, non-distended. Bowel sounds positive.  GU: Deferred. Musculoskeletal: No clubbing / cyanosis of digits/nails. No joint deformity upper and lower extremities.  Skin: No rashes, lesions, ulcers. No induration; Warm and dry.  Neurologic: CN 2-12 grossly intact with no focal deficits.  Romberg sign and cerebellar reflexes not assessed she does have some right eye erythema given her herpes zoster ophthalmicus Psychiatric: Normal judgment and insight. Alert and oriented x 3. Normal mood and appropriate affect.   Data Reviewed: I have  personally reviewed following labs and imaging studies  CBC: Recent Labs  Lab 09/04/22 1930 09/05/22 0355 09/06/22 0401 09/07/22 0432 09/08/22 0434  WBC 15.7* 13.3* 11.5* 11.3* 10.9*  NEUTROABS 12.0* 9.2*  --   --  6.9  HGB 11.3* 10.1* 10.5* 10.3* 11.0*  HCT 35.2* 30.7* 32.0* 33.0* 33.8*  MCV 101.7* 100.0 100.6* 107.1* 102.1*  PLT 166 156 181 222 539   Basic Metabolic Panel: Recent Labs  Lab 09/04/22 1930 09/05/22 0355 09/06/22 0401 09/07/22 0432 09/08/22 0434  NA 137 136 140 135 139  K 3.8 3.7 3.6 4.0 4.9  CL 109 108 109 104 104  CO2 22 20* 23 20* 24  GLUCOSE 150* 117* 101* 76 92  BUN 28* 23 18 14 18   CREATININE 1.22* 1.05* 0.98 1.03* 1.17*  CALCIUM 9.0 8.4* 8.7* 8.3* 9.0  MG  --   --   --  1.7 2.3  PHOS  --   --   --   --  4.4   GFR: Estimated Creatinine Clearance: 37.4 mL/min (A) (by C-G formula based on SCr of 1.17 mg/dL (H)). Liver Function Tests: Recent Labs  Lab 09/04/22 1930 09/05/22 0355 09/08/22 0434  AST 41 34 19  ALT 25 23 14   ALKPHOS 67 62 75  BILITOT 0.7 0.5 0.7  PROT 6.7 6.0* 6.9  ALBUMIN 2.6* 2.4* 2.4*   Recent Labs  Lab 09/04/22 1930  LIPASE 26   No results for input(s): "AMMONIA" in the last 168 hours. Coagulation Profile: No results for input(s): "INR", "PROTIME" in the last 168 hours. Cardiac Enzymes: No results for input(s): "CKTOTAL", "CKMB", "CKMBINDEX", "TROPONINI" in the last 168 hours. BNP (last 3 results) No results for input(s): "PROBNP" in the last 8760 hours. HbA1C: No results for input(s): "HGBA1C" in the last 72 hours. CBG: No results for input(s): "GLUCAP" in the last 168 hours. Lipid Profile: No results for input(s): "CHOL", "HDL", "LDLCALC", "TRIG", "CHOLHDL", "LDLDIRECT" in the last 72 hours. Thyroid Function Tests: No results for input(s): "TSH", "T4TOTAL", "FREET4", "T3FREE", "THYROIDAB" in the last 72 hours. Anemia Panel: No results for input(s): "VITAMINB12", "FOLATE", "FERRITIN", "TIBC", "IRON",  "RETICCTPCT" in the last 72 hours. Sepsis Labs: No results for input(s): "PROCALCITON", "LATICACIDVEN" in the last 168 hours.  Recent Results (from the past 240 hour(s))  Resp Panel by RT-PCR (Flu A&B, Covid) Anterior Nasal Swab     Status: None   Collection Time: 09/04/22  8:45 PM   Specimen: Anterior Nasal Swab  Result Value Ref Range Status   SARS Coronavirus 2 by RT PCR NEGATIVE NEGATIVE Final    Comment: (NOTE) SARS-CoV-2 target nucleic acids are NOT DETECTED.  The SARS-CoV-2 RNA is generally detectable in  upper respiratory specimens during the acute phase of infection. The lowest concentration of SARS-CoV-2 viral copies this assay can detect is 138 copies/mL. A negative result does not preclude SARS-Cov-2 infection and should not be used as the sole basis for treatment or other patient management decisions. A negative result may occur with  improper specimen collection/handling, submission of specimen other than nasopharyngeal swab, presence of viral mutation(s) within the areas targeted by this assay, and inadequate number of viral copies(<138 copies/mL). A negative result must be combined with clinical observations, patient history, and epidemiological information. The expected result is Negative.  Fact Sheet for Patients:  EntrepreneurPulse.com.au  Fact Sheet for Healthcare Providers:  IncredibleEmployment.be  This test is no t yet approved or cleared by the [place] FDA and  has been authorized for detection and/or diagnosis of SARS-CoV-2 by FDA under an Emergency Use Authorization (EUA). This EUA will remain  in effect (meaning this test can be used) for the duration of the COVID-19 declaration under Section 564(b)(1) of the Act, 21 U.S.C.section 360bbb-3(b)(1), unless the authorization is terminated  or revoked sooner.       Influenza A by PCR NEGATIVE NEGATIVE Final   Influenza B by PCR NEGATIVE NEGATIVE Final     Comment: (NOTE) The Xpert Xpress SARS-CoV-2/FLU/RSV plus assay is intended as an aid in the diagnosis of influenza from Nasopharyngeal swab specimens and should not be used as a sole basis for treatment. Nasal washings and aspirates are unacceptable for Xpert Xpress SARS-CoV-2/FLU/RSV testing.  Fact Sheet for Patients: EntrepreneurPulse.com.au  Fact Sheet for Healthcare Providers: IncredibleEmployment.be  This test is not yet approved or cleared by the [place] FDA and has been authorized for detection and/or diagnosis of SARS-CoV-2 by FDA under an Emergency Use Authorization (EUA). This EUA will remain in effect (meaning this test can be used) for the duration of the COVID-19 declaration under Section 564(b)(1) of the Act, 21 U.S.C. section 360bbb-3(b)(1), unless the authorization is terminated or revoked.  Performed at [facility], [place] [address], [place], [place] [address]   Urine Culture     Status: Abnormal   Collection Time: 09/04/22 10:06 PM   Specimen: In/Out Cath Urine  Result Value Ref Range Status   Specimen Description   Final    IN/OUT CATH URINE Performed at [place] [address], [place], [place] [address]    Special Requests   Final    NONE Performed at [facility], [place] [address], [place], [place] [address]    Culture >=100,000 COLONIES/mL KLEBSIELLA PNEUMONIAE (A)  Final   Report Status 09/07/2022 FINAL  Final   Organism ID, Bacteria KLEBSIELLA PNEUMONIAE (A)  Final      Susceptibility   Klebsiella pneumoniae - MIC*    AMPICILLIN >=32 RESISTANT Resistant     CEFAZOLIN <=4 SENSITIVE Sensitive     CEFEPIME <=0.12 SENSITIVE Sensitive     CEFTRIAXONE <=0.25 SENSITIVE Sensitive     CIPROFLOXACIN <=0.25 SENSITIVE Sensitive     GENTAMICIN <=1 SENSITIVE Sensitive     IMIPENEM <=0.25 SENSITIVE Sensitive     NITROFURANTOIN 128 RESISTANT Resistant      TRIMETH/SULFA <=20 SENSITIVE Sensitive     AMPICILLIN/SULBACTAM 16 INTERMEDIATE Intermediate     PIP/TAZO 16 SENSITIVE Sensitive     * >=100,000 COLONIES/mL KLEBSIELLA PNEUMONIAE    Radiology Studies: No results found.  Scheduled Meds:  buPROPion  150 mg Oral Daily   escitalopram  20 mg Oral Daily   fluconazole  200 mg Oral Daily   gabapentin  100 mg Oral TID   heparin  5,000 Units Subcutaneous Q8H   neomycin-polymyxin b-dexamethasone   Right Eye 6 X Daily   pantoprazole  40 mg Oral Daily   traZODone  50 mg Oral QHS   valACYclovir  500 mg Oral BID   Continuous Infusions:  sodium chloride 75 mL/hr at 09/08/22 1033    ceFAZolin (ANCEF) IV 1 g (09/08/22 1416)    LOS: 3 days   Raiford Noble, DO [facility] Available via Epic secure chat 7am-7pm After these hours, please refer to coverage provider listed on [email or web address] 09/08/2022, 3:39 PM

## 2022-09-08 NOTE — Progress Notes (Signed)
Mobility Specialist - Progress Note   09/08/22 1500  Mobility  Activity Ambulated with assistance in room  Activity Response Tolerated well  Distance Ambulated (ft) 10 ft  $Mobility charge 1 Mobility  Level of Assistance Moderate assist, patient does 50-74%  Assistive Device Front wheel walker  Range of Motion/Exercises Active   Pt was found in bed and agreeable to ambulate. Pt stating feeling very itchy throughout session and while ambulating stated having L ankel pain. At EOS returned to bed with all necessities in reach and RN notified.  Ferd Hibbs Mobility Specialist

## 2022-09-08 NOTE — TOC Progression Note (Addendum)
Transition of Care [facility]) - Progression Note    Patient Details  Name: DEANDRE STANSEL MRN: [id number] Date of Birth: Sep 17, 1941  [facility] CM/SW Contact  Roseanne Kaufman, RN Phone Number: 09/08/2022, 1:03 PM  Clinical Narrative:   Spoke with patient to advise of PT recommendation for short term SNF. Explained the process of SNF, patient reports she does not want to go to a SNF. Patient declines SNF, reports she feels comfortable with her friend helping her at home and wants HHPT services. Awaiting response from [facility] agencies for acceptance.   TOC will continue to follow.     - 1:38p Notified Caryl Pina at [facility] who will resume HHPT services for patient. [facility] agency information on AVS at discharge.  TOC will continue to follow.   Expected Discharge Plan: [place] Barriers to Discharge: Continued Medical Work up  Expected Discharge Plan and Services Expected Discharge Plan: [place] In-house Referral: NA Discharge Planning Services: CM Consult Post Acute Care Choice: [place] arrangements for the past 2 months: Single Family Home                 DME Arranged: N/A DME Agency: NA       HH Arranged: PT           Social Determinants of Health (SDOH) Interventions    Readmission Risk Interventions     No data to display

## 2022-09-08 NOTE — Care Management Important Message (Signed)
Important Message  Patient Details IM Letter given to the Patient. Name: Cindy Byrd MRN: [id number] Date of Birth: 1941/06/29   Medicare Important Message Given:  Yes     Kerin Salen 09/08/2022, 9:29 AM

## 2022-09-09 ENCOUNTER — Inpatient Hospital Stay (HOSPITAL_COMMUNITY): Payer: Medicare Other

## 2022-09-09 DIAGNOSIS — N390 Urinary tract infection, site not specified: Secondary | ICD-10-CM | POA: Diagnosis not present

## 2022-09-09 DIAGNOSIS — N3000 Acute cystitis without hematuria: Secondary | ICD-10-CM | POA: Diagnosis not present

## 2022-09-09 DIAGNOSIS — J432 Centrilobular emphysema: Secondary | ICD-10-CM | POA: Diagnosis not present

## 2022-09-09 DIAGNOSIS — Z66 Do not resuscitate: Secondary | ICD-10-CM | POA: Diagnosis not present

## 2022-09-09 DIAGNOSIS — N179 Acute kidney failure, unspecified: Secondary | ICD-10-CM

## 2022-09-09 LAB — URINALYSIS, COMPLETE (UACMP) WITH MICROSCOPIC
Bilirubin Urine: NEGATIVE
Glucose, UA: NEGATIVE mg/dL
Hgb urine dipstick: NEGATIVE
Ketones, ur: 5 mg/dL — AB
Leukocytes,Ua: NEGATIVE
Nitrite: NEGATIVE
Protein, ur: 30 mg/dL — AB
Specific Gravity, Urine: 1.023 (ref 1.005–1.030)
pH: 5 (ref 5.0–8.0)

## 2022-09-09 LAB — COMPREHENSIVE METABOLIC PANEL
ALT: 7 U/L (ref 0–44)
AST: 30 U/L (ref 15–41)
Albumin: 2 g/dL — ABNORMAL LOW (ref 3.5–5.0)
Alkaline Phosphatase: 70 U/L (ref 38–126)
Anion gap: 9 (ref 5–15)
BUN: 24 mg/dL — ABNORMAL HIGH (ref 8–23)
CO2: 22 mmol/L (ref 22–32)
Calcium: 7.8 mg/dL — ABNORMAL LOW (ref 8.9–10.3)
Chloride: 108 mmol/L (ref 98–111)
Creatinine, Ser: 1.71 mg/dL — ABNORMAL HIGH (ref 0.44–1.00)
GFR, Estimated: 30 mL/min — ABNORMAL LOW (ref >60)
Glucose, Bld: 83 mg/dL (ref 70–99)
Potassium: 4.9 mmol/L (ref 3.5–5.1)
Sodium: 139 mmol/L (ref 135–145)
Total Bilirubin: 1.1 mg/dL (ref 0.3–1.2)
Total Protein: 5.9 g/dL — ABNORMAL LOW (ref 6.5–8.1)

## 2022-09-09 LAB — CBC WITH DIFFERENTIAL/PLATELET
Abs Immature Granulocytes: 0.05 10*3/uL (ref 0.00–0.07)
Basophils Absolute: 0.1 10*3/uL (ref 0.0–0.1)
Basophils Relative: 1 %
Eosinophils Absolute: 0.3 10*3/uL (ref 0.0–0.5)
Eosinophils Relative: 2 %
HCT: 31.1 % — ABNORMAL LOW (ref 36.0–46.0)
Hemoglobin: 9.6 g/dL — ABNORMAL LOW (ref 12.0–15.0)
Immature Granulocytes: 0 %
Lymphocytes Relative: 19 %
Lymphs Abs: 2.2 10*3/uL (ref 0.7–4.0)
MCH: 32.8 pg (ref 26.0–34.0)
MCHC: 30.9 g/dL (ref 30.0–36.0)
MCV: 106.1 fL — ABNORMAL HIGH (ref 80.0–100.0)
Monocytes Absolute: 1 10*3/uL (ref 0.1–1.0)
Monocytes Relative: 9 %
Neutro Abs: 7.9 10*3/uL — ABNORMAL HIGH (ref 1.7–7.7)
Neutrophils Relative %: 69 %
Platelets: 301 10*3/uL (ref 150–400)
RBC: 2.93 MIL/uL — ABNORMAL LOW (ref 3.87–5.11)
RDW: 18 % — ABNORMAL HIGH (ref 11.5–15.5)
WBC: 11.6 10*3/uL — ABNORMAL HIGH (ref 4.0–10.5)
nRBC: 0.3 % — ABNORMAL HIGH (ref 0.0–0.2)

## 2022-09-09 LAB — MAGNESIUM: Magnesium: 2.2 mg/dL (ref 1.7–2.4)

## 2022-09-09 LAB — PHOSPHORUS: Phosphorus: 4.8 mg/dL — ABNORMAL HIGH (ref 2.5–4.6)

## 2022-09-09 MED ORDER — TRAMADOL HCL 50 MG PO TABS
50.0000 mg | ORAL_TABLET | Freq: Four times a day (QID) | ORAL | Status: DC | PRN
  Administered 2022-09-09 – 2022-09-11 (×6): 50 mg via ORAL
  Filled 2022-09-09 (×7): qty 1

## 2022-09-09 MED ORDER — GUAIFENESIN ER 600 MG PO TB12
1200.0000 mg | ORAL_TABLET | Freq: Two times a day (BID) | ORAL | Status: DC
  Administered 2022-09-09 – 2022-09-12 (×6): 1200 mg via ORAL
  Filled 2022-09-09 (×6): qty 2

## 2022-09-09 MED ORDER — CEFAZOLIN SODIUM-DEXTROSE 1-4 GM/50ML-% IV SOLN
1.0000 g | Freq: Two times a day (BID) | INTRAVENOUS | Status: DC
  Administered 2022-09-10: 1 g via INTRAVENOUS
  Filled 2022-09-09: qty 50

## 2022-09-09 MED ORDER — SODIUM CHLORIDE 0.9 % IV BOLUS
500.0000 mL | Freq: Once | INTRAVENOUS | Status: AC
  Administered 2022-09-09: 500 mL via INTRAVENOUS

## 2022-09-09 NOTE — Progress Notes (Signed)
Occupational Therapy Treatment Patient Details Name: Cindy Byrd MRN: [id number] DOB: 03-Apr-1941 Today's Date: 09/09/2022   History of present illness 81 yo female admitted with acute cystitis, weakness, repeated falls at home.Hx of shingles, met lung ca, PE, esophageal Ca, Afib, DVT, herpes zoster opthalmicus.   OT comments  Patient was pleasant and motivated to participate in the session. She reported having increased R facial/eye area pain (history of shingles from July 2023), and significant L ankle pain once in standing. Given her increased L ankle pain, she presented with compromised standing tolerance and impaired dynamic standing balance. She was also noted to be with general deconditioning & intermittent shortness of breath with activity, requiring cues to implement deep breathing exercises. She was assisted to the bedside chair at the end of the session. She will continue to benefit from further OT services to maximize her safety and independence with ADLs and to decrease the risk for further deconditioning.     Recommendations for follow up therapy are one component of a multi-disciplinary discharge planning process, led by the attending physician.  Recommendations may be updated based on patient status, additional functional criteria and insurance authorization.    Follow Up Recommendations  Home health OT    Assistance Recommended at Discharge Intermittent Supervision/Assistance  Patient can return home with the following  A little help with walking and/or transfers;A little help with bathing/dressing/bathroom;Help with stairs or ramp for entrance;Assistance with cooking/housework;Assist for transportation   Equipment Recommendations  None recommended by OT       Precautions / Restrictions Precautions Precautions: Fall Precaution Comments: repeated falls at home prior to this admission Restrictions Weight Bearing Restrictions: No       Mobility                      Transfers Overall transfer level: Needs assistance Equipment used: Rolling walker (2 wheels) Transfers: Sit to/from Stand Sit to Stand: Min assist           General transfer comment: verbal and tactile cues for correct hand placement         ADL either performed or assessed with clinical judgement               Cognition Arousal/Alertness: Awake/alert Behavior During Therapy: WFL for tasks assessed/performed Overall Cognitive Status: Within Functional Limits for tasks assessed                        Pertinent Vitals/ Pain       Pain Assessment Pain Assessment: 0-10 Pain Score: 7  Pain Location: L ankle in standing and R eye due to recent shingles Pain Intervention(s): Limited activity within patient's tolerance         Frequency  Min 2X/week        Progress Toward Goals  OT Goals(current goals can now be found in the care plan section)  Progress towards OT goals: Progressing toward goals  Acute Rehab OT Goals Patient Stated Goal: decreased pain and to get better OT Goal Formulation: With patient Time For Goal Achievement: 09/20/22 Potential to Achieve Goals: Good  Plan         AM-PAC OT "6 Clicks" Daily Activity     Outcome Measure   Help from another person eating meals?: None Help from another person taking care of personal grooming?: A Little Help from another person toileting, which includes using toliet, bedpan, or urinal?: A Little Help from another person bathing (including washing,  rinsing, drying)?: A Little Help from another person to put on and taking off regular upper body clothing?: A Little Help from another person to put on and taking off regular lower body clothing?: A Little 6 Click Score: 19    End of Session Equipment Utilized During Treatment: Gait belt;Rolling walker (2 wheels)  OT Visit Diagnosis: Unsteadiness on feet (R26.81);Pain;Muscle weakness (generalized) (M62.81)   Activity Tolerance Patient  limited by fatigue;Patient limited by pain   Patient Left with call bell/phone within reach;in chair;with chair alarm set   Nurse Communication Mobility status        Time: 0110-0349 OT Time Calculation (min): 21 min  Charges: OT General Charges $OT Visit: 1 Visit OT Treatments $Therapeutic Activity: 8-22 mins    Leota Sauers, OTR/L 09/09/2022, 5:05 PM

## 2022-09-09 NOTE — Progress Notes (Signed)
Patient transported to room 1325.  Bedside report given to receiving RN.  Angie Fava, RN

## 2022-09-09 NOTE — Progress Notes (Signed)
PHARMACY NOTE:  ANTIMICROBIAL RENAL DOSAGE ADJUSTMENT  Current antimicrobial regimen includes a mismatch between antimicrobial dosage and estimated renal function. As per policy approved by the Pharmacy & Therapeutics and Medical Executive Committees, the antimicrobial dosage will be adjusted accordingly.  Current antimicrobial and dosage:  Ancef 1 g q8 hr  Indication: cystitis  Renal Function:   Estimated Creatinine Clearance: 25.6 mL/min (A) (by C-G formula based on SCr of 1.71 mg/dL (H)). []      On intermittent HD, scheduled: []      On CRRT    Antimicrobial dosage has been changed to:  1 g IV q12 hr   Additional Comments: n/a   Thank you for allowing pharmacy to be a part of this patient's care.  Reuel Boom, PharmD, BCPS [phone] 09/09/2022, 3:36 PM

## 2022-09-09 NOTE — Progress Notes (Signed)
   09/08/22 2000  Vitals  Temp 98.3 F (36.8 C)  Temp Source Oral  BP (!) 81/47  MAP (mmHg) (!) 58  BP Location Left Arm  BP Method Automatic  Patient Position (if appropriate) Lying  Pulse Rate (!) 102  Pulse Rate Source Monitor  Resp 20  MEWS COLOR  MEWS Score Color Yellow  Oxygen Therapy  SpO2 (!) 86 %  O2 Device Room Air  Pain Assessment  Pain Scale 0-10  Pain Score 0  Complaints & Interventions  Neuro symptoms relieved by Rest  MEWS Score  MEWS Temp 0  MEWS Systolic 1  MEWS Pulse 1  MEWS RR 0  MEWS LOC 0  MEWS Score 2  Provider Notification  Provider Name/Title Raenette Rover, NP  Date Provider Notified 09/08/22  Time Provider Notified 2012  Method of Notification Page (secure chat)  Notification Reason Other (Comment) Celedonio Savage MEWS)  Provider response See new orders  Date of Provider Response 09/08/22  Time of Provider Response 2012   Pt was in yellow MEWS. Notified attending on-call and CN.

## 2022-09-09 NOTE — Progress Notes (Signed)
Bladder scan revealed 15 mL urine.  Patient had large unmeasured urine occurrence in bed.  C/o 5/10 pain in eye, PRN pain med given.  Per chart, patient has not had BM since 10/3.  Stool softener requested from MD.  Patient c/o of new congested cough, difficulty breathing when laying flat, and some soreness mid-lower chest with deep breathing.  SpO2 was 89% on RA this morning at shift change, now 95% on 2 L.  MD notified.  Son Ron at bedside.  Angie Fava, RN

## 2022-09-09 NOTE — Progress Notes (Signed)
PROGRESS NOTE    CORRINNA Byrd  PNT:[id number] DOB: [birth year] DOA: 09/04/2022 PCP: [name], PA-C   Brief Narrative:  Cindy Byrd is a 81 y.o. female with past medical history significant for metastatic lung cancer to bone, esophageal cancer, essential hypertension, paroxysmal atrial fibrillation, history of DVT/PE who self discontinued Xarelto due to cost presented to [facility] ED on 10/1 via ambulance complaining of multiple falls, neck pain, and generalized weakness.  Patient was recently diagnosed with herpes zoster ophthalmicus and is being followed by ophthalmology at [facility].  Patient lives alone but has a friend staying with her and utilizes a walker at baseline.  Denies fever/chills.   In the ED, temperature 98.3 F, HR 87, RR 18, BP 111/66, SPO2 96% on room air.  WBC 15.7, hemoglobin 11.3, platelets 166.  Sodium 137, potassium 3.8, chloride 109, CO2 22, BUN 28, creatinine 1.22, glucose 150.  AST 41, ALT 25, total bilirubin 0.7.  Lipase 26.  Influenza A/B PCR negative.  COVID-19 PCR negative.  Urinalysis with small leukocytes, positive nitrite, many bacteria, 11-20 WBCs.  CT head/C-spine without contrast with no evidence of acute intracranial abnormality, atrophy with small vessel ischemic changes, no evidence of traumatic injury to the cervical spine with mild multilevel degenerative changes.  Chest x-ray with no active cardiopulmonary disease process.  EDP consulted [facility] for admission for further evaluation and treatment of generalized weakness, recurrent falls and urinary tract infection.  Patient's renal function worsened in the setting of hypotension and she now has a slight AKI and will continue IV fluid hydration and give her another bolus.  Overnight she received a 500 mill bolus for hypotension.  Oxycodone has been changed to p.o. tramadol.  PT OT evaluated and still recommending SNF but she is refusing and wants to go home with home health.  Assessment and  Plan: Klebsiella pneumonia UTI Urinalysis with small leukocytes, positive nitrite, many bacteria, 11-20 WBCs.  WBC count elevated on admission 15.7. --WBC 15.7>13.3>11.5 and now WBC is 11.3 the day before yesterday and yesterday is 10.9 and now WBC is 11.6 --Urine culture: >100K Klebsiella pneumonia, susceptibilities pending --Ceftriaxone 1 g IV every 24 hours and will de-esclate to IV Cefazolin and will change to p.o. cefdinir at discharge -Continue to monitor CBCs daily   Herpes zoster ophthalmicus of right eye -Follows with [place] [facility], Dr. Gershon Crane.   -Continue erythromycin ophthalmologic ointment right eye every 6 hours however after discussion with ophthalmology they recommended Maxitrol ointment 6 times daily, Valtrex 500 mg p.o. twice daily.   -Outpatient follow-up with ophthalmology and I spoke with Dr. Alanda Slim and he recommends her following up within 1 week. -We will start gabapentin 100 mg 3 times daily -Patient was complaining about significant pain so we will stop her oxycodone and change her to tramadol given her hypotension   AKI on CKD stage IIIb Metabolic Acidosis Baseline creatinine 1.2-1.5, creatinine 1.05; stable. -Cr 1.22>1.02>0.98 and now BUN/Cr is now 14/1.03 yesterday but worsened overnight and is now 24/1.71 and has an AKI in the setting of hypotension -Patient's CO2 is now 22, AG is 9, and Chloride Level is now 108 -We will resume IV fluid hydration with normal saline at 75 MLS per hour for 1 day again and she received a 500 mL bolus yesterday and will get another one today given her hypotension -Patient was hypotensive throughout the day yesterday and overnight likely contributing to her AKI -We will obtain a repeat urinalysis and this showed  an Scientist, product/process development with negative glucose, negative hemoglobin, negative nitrites, negative leukocytes, rare bacteria, 0-5 RBCs per high-power field, 0-5 squamous epithelial cells and 0-5  WBCs -Renal ultrasound done pending -Avoid nephrotoxic medications, contrast dyes, hypotension and dehydration to ensure adequate renal perfusion and will need to renally dose medications -Repeat CMP in a.m. and if necessary and worsening will need to call nephrology for further evaluation   Hx pulmonary embolism/DVT -Patient previously on Xarelto, but has self discontinued this medication stating that it was too expensive for her to afford and she does not wish to restart anticoagulation. -Last CT angiogram chest in the EMR on 02/25/2019 with no pulmonary embolism evident.  Vascular duplex ultrasound 09/03/2016 with acute DVT.   -Recommend outpatient follow-up with her PCP/specialist.   Paroxysmal atrial fibrillation -Previously on Xarelto, now self discontinued as above. -We will need outpatient follow-up with PCP   Emphysema -Stable, not currently exacerbated -Continue monitor respiratory status carefully but she is now on oxygen -SpO2: 93 % (with activity) O2 Flow Rate (L/min): 1 L/min -CXR done and showed "Bronchitic changes with bibasilar atelectasis. Aortic Atherosclerosis " -Continue monitor respiratory status carefully and wean O2 as tolerated and will need an amatory home O2 screen prior to discharge -We will add flutter valve, guaifenesin 1200 mg p.o. twice daily as well as incentive spirometry  Macrocytic anemia Patient's hemoglobin/hematocrit has been relatively stable for the last few days but slight drop likely in the setting of dilution and is now 9.6/31.1 with a MCV of 106.1 -Check anemia panel in a.m. -Continue to monitor for signs and symptoms bleeding; no overt bleeding noted -Repeat CBC in a.m.  Hypoalbuminemia -Patient's albumin level has gone from 2.4 is now 2.0 -Continue monitor and trend and repeat CMP in a.m.    Depression/anxiety: -Continue with Wellbutrin 150 mg p.o. daily and Lexapro 20 mg p.o. daily   GERD -Protonix 40 mg p.o. daily   Metastatic  non-small cell lung cancer with metastasis to left ilium Adenocarcinoma of the esophagus -Follows with medical oncology outpatient, Dr. Irene Limbo.   -Currently on active surveillance, Xgeva q12 weeks for bone metastasis and Sandostatin q4 weeks for diarrhea.   -Outpatient follow-up with oncology.   Weakness/debility/multiple falls: --PT recommending home health initially but now recommending SNF --OT consult: Recommending home health -Follow-up with [facility] for discharge disposition and SNF was recommended but she does not want to go to SNF so we will be going home with home health  DVT prophylaxis: heparin injection 5,000 Units Start: 09/05/22 0600 SCDs Start: 09/05/22 0143    Code Status: DNR Family Communication: Discussed with the patient's son over the telephone  Disposition Plan:  Level of care: Med-Surg Status is: Inpatient Remains inpatient appropriate because: Renal function worsened and will need to monitor her carefully.  She was also hypotensive yesterday and if she is improving can likely discharge in the next 24 to 48 hours   Consultants:  Discussed with ophthalmology  Procedures:  As delineated as above  Antimicrobials:  Anti-infectives (From admission, onward)    Start     Dose/Rate Route Frequency Ordered Stop   09/10/22 1000  ceFAZolin (ANCEF) IVPB 1 g/50 mL premix        1 g 100 mL/hr over 30 Minutes Intravenous Every 12 hours 09/09/22 1535     09/07/22 2200  ceFAZolin (ANCEF) IVPB 1 g/50 mL premix  Status:  Discontinued        1 g 100 mL/hr over 30 Minutes Intravenous Every 12 hours  09/07/22 1459 09/07/22 1627   09/07/22 2200  ceFAZolin (ANCEF) IVPB 1 g/50 mL premix        1 g 100 mL/hr over 30 Minutes Intravenous Every 8 hours 09/07/22 1627 09/10/22 0559   09/05/22 1000  fluconazole (DIFLUCAN) tablet 200 mg        200 mg Oral Daily 09/05/22 0143     09/05/22 1000  valACYclovir (VALTREX) tablet 500 mg        500 mg Oral 2 times daily 09/05/22 0143     09/05/22  0900  cefTRIAXone (ROCEPHIN) 1 g in sodium chloride 0.9 % 100 mL IVPB  Status:  Discontinued        1 g 200 mL/hr over 30 Minutes Intravenous Every 24 hours 09/05/22 0143 09/05/22 0144   09/04/22 2300  cefTRIAXone (ROCEPHIN) 1 g in sodium chloride 0.9 % 100 mL IVPB  Status:  Discontinued        1 g 200 mL/hr over 30 Minutes Intravenous Every 24 hours 09/04/22 2259 09/07/22 1459       Subjective: Seen and examined at bedside and patient's friend is at bedside and patient's friend states that the patient doing better.  Patient herself feels little bit better and states that her eye is not hurting as bad but she states that she continues to have some nerve pain.  No nausea or vomiting.  Denies any chest pain or shortness of breath.  No other concerns or complaints at this time.  Objective: Vitals:   09/09/22 1211 09/09/22 1217 09/09/22 1308 09/09/22 1602  BP: (!) 96/52 (!) 102/51 103/60   Pulse: 89 90 88   Resp: (!) 22 (!) 22 (!) 21   Temp: 98.2 F (36.8 C) 98.2 F (36.8 C) 98.5 F (36.9 C)   TempSrc: Oral Oral Oral   SpO2: 95% 95% 91% 93%  Weight:      Height:        Intake/Output Summary (Last 24 hours) at 09/09/2022 1742 Last data filed at 09/09/2022 1726 Gross per 24 hour  Intake 2668.32 ml  Output 325 ml  Net 2343.32 ml   Filed Weights   09/07/22 0500 09/08/22 0500 09/09/22 0408  Weight: 66.5 kg 66.6 kg 66.9 kg   Examination: Physical Exam:  Constitutional: Thin elderly Caucasian female currently no acute distress Respiratory: Diminished to auscultation bilaterally, no wheezing, rales, rhonchi or crackles. Normal respiratory effort and patient is not tachypenic. No accessory muscle use.  Unlabored breathing Cardiovascular: RRR, no murmurs / rubs / gallops. S1 and S2 auscultated. No extremity edema.  Abdomen: Soft, non-tender, non-distended.  Bowel sounds positive.  GU: Deferred. Musculoskeletal: No clubbing / cyanosis of digits/nails. No joint deformity upper and lower  extremities.  Skin: Has right eye herpes zoster ophthalmicus with some erythema and some drainage from the right eye but looks like it is mildly improved Neurologic: CN 2-12 grossly intact with no focal deficits. Sensation intact in all 4 Extremities,  Psychiatric: Normal judgment and insight. Alert and oriented x 3. Normal mood and appropriate affect.   Data Reviewed: I have personally reviewed following labs and imaging studies  CBC: Recent Labs  Lab 09/04/22 1930 09/05/22 0355 09/06/22 0401 09/07/22 0432 09/08/22 0434 09/09/22 0414  WBC 15.7* 13.3* 11.5* 11.3* 10.9* 11.6*  NEUTROABS 12.0* 9.2*  --   --  6.9 7.9*  HGB 11.3* 10.1* 10.5* 10.3* 11.0* 9.6*  HCT 35.2* 30.7* 32.0* 33.0* 33.8* 31.1*  MCV 101.7* 100.0 100.6* 107.1* 102.1* 106.1*  PLT 166  156 181 222 297 295   Basic Metabolic Panel: Recent Labs  Lab 09/05/22 0355 09/06/22 0401 09/07/22 0432 09/08/22 0434 09/09/22 0414  NA 136 140 135 139 139  K 3.7 3.6 4.0 4.9 4.9  CL 108 109 104 104 108  CO2 20* 23 20* 24 22  GLUCOSE 117* 101* 76 92 83  BUN 23 18 14 18  24*  CREATININE 1.05* 0.98 1.03* 1.17* 1.71*  CALCIUM 8.4* 8.7* 8.3* 9.0 7.8*  MG  --   --  1.7 2.3 2.2  PHOS  --   --   --  4.4 4.8*   GFR: Estimated Creatinine Clearance: 25.6 mL/min (A) (by C-G formula based on SCr of 1.71 mg/dL (H)). Liver Function Tests: Recent Labs  Lab 09/04/22 1930 09/05/22 0355 09/08/22 0434 09/09/22 0414  AST 41 34 19 30  ALT 25 23 14 7   ALKPHOS 67 62 75 70  BILITOT 0.7 0.5 0.7 1.1  PROT 6.7 6.0* 6.9 5.9*  ALBUMIN 2.6* 2.4* 2.4* 2.0*   Recent Labs  Lab 09/04/22 1930  LIPASE 26   No results for input(s): "AMMONIA" in the last 168 hours. Coagulation Profile: No results for input(s): "INR", "PROTIME" in the last 168 hours. Cardiac Enzymes: No results for input(s): "CKTOTAL", "CKMB", "CKMBINDEX", "TROPONINI" in the last 168 hours. BNP (last 3 results) No results for input(s): "PROBNP" in the last 8760  hours. HbA1C: No results for input(s): "HGBA1C" in the last 72 hours. CBG: No results for input(s): "GLUCAP" in the last 168 hours. Lipid Profile: No results for input(s): "CHOL", "HDL", "LDLCALC", "TRIG", "CHOLHDL", "LDLDIRECT" in the last 72 hours. Thyroid Function Tests: No results for input(s): "TSH", "T4TOTAL", "FREET4", "T3FREE", "THYROIDAB" in the last 72 hours. Anemia Panel: No results for input(s): "VITAMINB12", "FOLATE", "FERRITIN", "TIBC", "IRON", "RETICCTPCT" in the last 72 hours. Sepsis Labs: No results for input(s): "PROCALCITON", "LATICACIDVEN" in the last 168 hours.  Recent Results (from the past 240 hour(s))  Resp Panel by RT-PCR (Flu A&B, Covid) Anterior Nasal Swab     Status: None   Collection Time: 09/04/22  8:45 PM   Specimen: Anterior Nasal Swab  Result Value Ref Range Status   SARS Coronavirus 2 by RT PCR NEGATIVE NEGATIVE Final    Comment: (NOTE) SARS-CoV-2 target nucleic acids are NOT DETECTED.  The SARS-CoV-2 RNA is generally detectable in upper respiratory specimens during the acute phase of infection. The lowest concentration of SARS-CoV-2 viral copies this assay can detect is 138 copies/mL. A negative result does not preclude SARS-Cov-2 infection and should not be used as the sole basis for treatment or other patient management decisions. A negative result may occur with  improper specimen collection/handling, submission of specimen other than nasopharyngeal swab, presence of viral mutation(s) within the areas targeted by this assay, and inadequate number of viral copies(<138 copies/mL). A negative result must be combined with clinical observations, patient history, and epidemiological information. The expected result is Negative.  Fact Sheet for Patients:  EntrepreneurPulse.com.au  Fact Sheet for Healthcare Providers:  IncredibleEmployment.be  This test is no t yet approved or cleared by the [place] FDA  and  has been authorized for detection and/or diagnosis of SARS-CoV-2 by FDA under an Emergency Use Authorization (EUA). This EUA will remain  in effect (meaning this test can be used) for the duration of the COVID-19 declaration under Section 564(b)(1) of the Act, 21 U.S.C.section 360bbb-3(b)(1), unless the authorization is terminated  or revoked sooner.       Influenza A by  PCR NEGATIVE NEGATIVE Final   Influenza B by PCR NEGATIVE NEGATIVE Final    Comment: (NOTE) The Xpert Xpress SARS-CoV-2/FLU/RSV plus assay is intended as an aid in the diagnosis of influenza from Nasopharyngeal swab specimens and should not be used as a sole basis for treatment. Nasal washings and aspirates are unacceptable for Xpert Xpress SARS-CoV-2/FLU/RSV testing.  Fact Sheet for Patients: EntrepreneurPulse.com.au  Fact Sheet for Healthcare Providers: IncredibleEmployment.be  This test is not yet approved or cleared by the [place] FDA and has been authorized for detection and/or diagnosis of SARS-CoV-2 by FDA under an Emergency Use Authorization (EUA). This EUA will remain in effect (meaning this test can be used) for the duration of the COVID-19 declaration under Section 564(b)(1) of the Act, 21 U.S.C. section 360bbb-3(b)(1), unless the authorization is terminated or revoked.  Performed at [facility], [place] [address] [place], [place] [address]   Urine Culture     Status: Abnormal   Collection Time: 09/04/22 10:06 PM   Specimen: In/Out Cath Urine  Result Value Ref Range Status   Specimen Description   Final    IN/OUT CATH URINE Performed at [facility] [address] [place], [place] [address]    Special Requests   Final    NONE Performed at [facility], [place] [address] [place], [place] [address]    Culture >=100,000 COLONIES/mL KLEBSIELLA PNEUMONIAE (A)  Final   Report Status 09/07/2022  FINAL  Final   Organism ID, Bacteria KLEBSIELLA PNEUMONIAE (A)  Final      Susceptibility   Klebsiella pneumoniae - MIC*    AMPICILLIN >=32 RESISTANT Resistant     CEFAZOLIN <=4 SENSITIVE Sensitive     CEFEPIME <=0.12 SENSITIVE Sensitive     CEFTRIAXONE <=0.25 SENSITIVE Sensitive     CIPROFLOXACIN <=0.25 SENSITIVE Sensitive     GENTAMICIN <=1 SENSITIVE Sensitive     IMIPENEM <=0.25 SENSITIVE Sensitive     NITROFURANTOIN 128 RESISTANT Resistant     TRIMETH/SULFA <=20 SENSITIVE Sensitive     AMPICILLIN/SULBACTAM 16 INTERMEDIATE Intermediate     PIP/TAZO 16 SENSITIVE Sensitive     * >=100,000 COLONIES/mL KLEBSIELLA PNEUMONIAE     Radiology Studies: DG CHEST PORT 1 VIEW  Result Date: 09/09/2022 CLINICAL DATA:  Shortness of breath, metastatic lung cancer to bone, esophageal cancer, hypertension, atrial fibrillation EXAM: PORTABLE CHEST 1 VIEW COMPARISON:  Portable exam 1352 hours compared to 09/08/2022 FINDINGS: RIGHT jugular Port-A-Cath with tip projecting over SVC. Normal heart size, mediastinal contours, and pulmonary vascularity. Atherosclerotic calcification aorta. Bibasilar atelectasis and mild central bronchitic changes. No pulmonary infiltrate, pleural effusion, or pneumothorax. Sclerotic focus at anterior RIGHT fifth rib corresponding to sclerotic focus on prior CT. IMPRESSION: Bronchitic changes with bibasilar atelectasis. Aortic Atherosclerosis (ICD10-I70.0). Electronically Signed   By: Lavonia Dana M.D.   On: 09/09/2022 14:21   DG Chest Port 1 View  Result Date: 09/08/2022 CLINICAL DATA:  Fevers EXAM: PORTABLE CHEST 1 VIEW COMPARISON:  09/04/2022 FINDINGS: Right chest wall port is noted and stable. Cardiac shadow is stable. Aortic calcifications are seen. Lungs are well aerated bilaterally. Minimal platelike atelectasis is noted in the left base. No acute bony abnormality is seen. Old rib fractures are again noted. IMPRESSION: Mild left basilar atelectasis. Electronically Signed   By:  Inez Catalina M.D.   On: 09/08/2022 21:59    Scheduled Meds:  buPROPion  150 mg Oral Daily   escitalopram  20 mg Oral Daily   fluconazole  200  mg Oral Daily   gabapentin  100 mg Oral TID   heparin  5,000 Units Subcutaneous Q8H   neomycin-polymyxin b-dexamethasone   Right Eye 6 X Daily   pantoprazole  40 mg Oral Daily   traZODone  50 mg Oral QHS   valACYclovir  500 mg Oral BID   Continuous Infusions:  sodium chloride 75 mL/hr at 09/09/22 1726    ceFAZolin (ANCEF) IV Stopped (09/09/22 1540)   [START ON 09/10/2022]  ceFAZolin (ANCEF) IV      LOS: 4 days   Raiford Noble, DO [facility] Available via Epic secure chat 7am-7pm After these hours, please refer to coverage provider listed on [email or web address] 09/09/2022, 5:42 PM

## 2022-09-09 NOTE — Progress Notes (Signed)
Physical Therapy Treatment Patient Details Name: Cindy Byrd MRN: [id number] DOB: Jul 21, 1941 Today's Date: 09/09/2022   History of Present Illness 81 yo female admitted with acute cystitis, weakness, repeated falls at home.Hx of shingles, met lung ca, PE, esophageal Ca, Afib, DVT, herpes zoster opthalmicus.    PT Comments    With max encouragement pt agreeable. Pt is refusing SNF so we discusses that pt will need to get up and move more than once per day when she goes home (pt was declining because OT worked with her). Pt is still on purewick, recommend pt be up to bathroom vs being immobile and reliant on purewick since pt desires to return home vs SNF.  Will continue to work with pt in acute setting.   Recommendations for follow up therapy are one component of a multi-disciplinary discharge planning process, led by the attending physician.  Recommendations may be updated based on patient status, additional functional criteria and insurance authorization.  Follow Up Recommendations  Home health PT (pt refuses SNF)     Assistance Recommended at Discharge Frequent or constant Supervision/Assistance  Patient can return home with the following A little help with walking and/or transfers;A little help with bathing/dressing/bathroom;Assistance with cooking/housework;Assist for transportation;Help with stairs or ramp for entrance   Equipment Recommendations  None recommended by PT    Recommendations for Other Services       Precautions / Restrictions Precautions Precautions: Fall Precaution Comments: repeated falls at home prior to this admission Restrictions Weight Bearing Restrictions: No     Mobility  Bed Mobility               General bed mobility comments: in recliner    Transfers Overall transfer level: Needs assistance Equipment used: Rolling walker (2 wheels) Transfers: Sit to/from Stand Sit to Stand: Min assist           General transfer comment:  verbal and tactile cues for correct hand placement. assist to stand and transition to RW    Ambulation/Gait Ambulation/Gait assistance: Min assist Gait Distance (Feet): 20 Feet Assistive device: Rolling walker (2 wheels) Gait Pattern/deviations: Step-through pattern, Decreased stride length, Trunk flexed       General Gait Details: multi-modal cues for RW position, sequence, to keep RW in contact with floor, posture; SpO2=91-94% on RA with mobility. O2 replaced at rest   Stairs             Wheelchair Mobility    Modified Rankin (Stroke Patients Only)       Balance           Standing balance support: Reliant on assistive device for balance, Bilateral upper extremity supported, During functional activity Standing balance-Leahy Scale: Poor                              Cognition Arousal/Alertness: Awake/alert Behavior During Therapy: WFL for tasks assessed/performed Overall Cognitive Status: Within Functional Limits for tasks assessed                                          Exercises      General Comments        Pertinent Vitals/Pain Pain Assessment Pain Assessment: 0-10 Pain Score: 8  Pain Location: L ankle (8/10); face/eye 6/10 Pain Descriptors / Indicators: Discomfort, Grimacing Pain Intervention(s): Limited activity within patient's tolerance, Monitored during  session, Other (comment) (RN bringing meds as soon as they are due)    Home Living                          Prior Function            PT Goals (current goals can now be found in the care plan section) Acute Rehab PT Goals Patient Stated Goal: return home PT Goal Formulation: With patient Time For Goal Achievement: 09/20/22 Potential to Achieve Goals: Good Progress towards PT goals: Progressing toward goals    Frequency    Min 3X/week      PT Plan Discharge plan needs to be updated    Co-evaluation              AM-PAC PT "6  Clicks" Mobility   Outcome Measure  Help needed turning from your back to your side while in a flat bed without using bedrails?: A Little Help needed moving from lying on your back to sitting on the side of a flat bed without using bedrails?: A Little Help needed moving to and from a bed to a chair (including a wheelchair)?: A Little Help needed standing up from a chair using your arms (e.g., wheelchair or bedside chair)?: A Little Help needed to walk in hospital room?: A Little Help needed climbing 3-5 steps with a railing? : A Lot 6 Click Score: 17    End of Session Equipment Utilized During Treatment: Gait belt Activity Tolerance: Patient limited by fatigue;Patient limited by pain (ankle pain) Patient left: in chair;with call bell/phone within reach;with chair alarm set   PT Visit Diagnosis: Muscle weakness (generalized) (M62.81);Difficulty in walking, not elsewhere classified (R26.2);History of falling (Z91.81);Repeated falls (R29.6)     Time: 7673-4193 PT Time Calculation (min) (ACUTE ONLY): 18 min  Charges:  $Gait Training: 8-22 mins                     Baxter Flattery, PT  Acute Rehab Dept [facility]) [phone]  [facility] Weekend Pager [facility] only)  [phone]  09/09/2022    [facility] 09/09/2022, 4:55 PM

## 2022-09-09 NOTE — Progress Notes (Signed)
   09/08/22 2104 09/08/22 2200 09/08/22 2221  Vitals  Temp  --  98.4 F (36.9 C)  --   Temp Source  --  Oral  --   BP  --  (!) 87/50 (!) 91/46  MAP (mmHg)  --  (!) 62 (!) 61  BP Location  --  Right Arm Right Arm  BP Method  --  Automatic Automatic  Patient Position (if appropriate)  --  Lying Lying  Pulse Rate  --  92 91  Pulse Rate Source  --  Monitor Monitor  Resp  --  20  --   MEWS COLOR  MEWS Score Color  --  Ailene Ards  Oxygen Therapy  SpO2 91 % 95 %  --   O2 Device Room Air Nasal Cannula  --   O2 Flow Rate (L/min)  --  2 L/min  --     09/09/22 0031 09/09/22 0405  Vitals  Temp 98 F (36.7 C) 98.4 F (36.9 C)  Temp Source Oral Oral  BP (!) 109/58 (!) 102/57  MAP (mmHg) 71 69  BP Location Right Arm Right Arm  BP Method Automatic Automatic  Patient Position (if appropriate) Lying Lying  Pulse Rate 90 87  Pulse Rate Source Monitor Monitor  Resp (!) 22 20  MEWS COLOR  MEWS Score Color Green Green  Oxygen Therapy  SpO2 95 % 95 %  O2 Device Nasal Cannula Nasal Cannula  O2 Flow Rate (L/min) 2 L/min 2 L/min   Pt's BP went up after receiving NS bolus.  Will continue to monitor.

## 2022-09-10 ENCOUNTER — Inpatient Hospital Stay (HOSPITAL_COMMUNITY): Payer: Medicare Other

## 2022-09-10 DIAGNOSIS — Z66 Do not resuscitate: Secondary | ICD-10-CM | POA: Diagnosis not present

## 2022-09-10 DIAGNOSIS — R059 Cough, unspecified: Secondary | ICD-10-CM

## 2022-09-10 DIAGNOSIS — N390 Urinary tract infection, site not specified: Secondary | ICD-10-CM | POA: Diagnosis not present

## 2022-09-10 DIAGNOSIS — R0600 Dyspnea, unspecified: Secondary | ICD-10-CM

## 2022-09-10 DIAGNOSIS — J432 Centrilobular emphysema: Secondary | ICD-10-CM | POA: Diagnosis not present

## 2022-09-10 DIAGNOSIS — N3000 Acute cystitis without hematuria: Secondary | ICD-10-CM | POA: Diagnosis not present

## 2022-09-10 LAB — CBC WITH DIFFERENTIAL/PLATELET
Abs Immature Granulocytes: 0.07 10*3/uL (ref 0.00–0.07)
Basophils Absolute: 0.1 10*3/uL (ref 0.0–0.1)
Basophils Relative: 1 %
Eosinophils Absolute: 0.3 10*3/uL (ref 0.0–0.5)
Eosinophils Relative: 3 %
HCT: 27.2 % — ABNORMAL LOW (ref 36.0–46.0)
Hemoglobin: 8.6 g/dL — ABNORMAL LOW (ref 12.0–15.0)
Immature Granulocytes: 1 %
Lymphocytes Relative: 16 %
Lymphs Abs: 1.8 10*3/uL (ref 0.7–4.0)
MCH: 32.3 pg (ref 26.0–34.0)
MCHC: 31.6 g/dL (ref 30.0–36.0)
MCV: 102.3 fL — ABNORMAL HIGH (ref 80.0–100.0)
Monocytes Absolute: 1 10*3/uL (ref 0.1–1.0)
Monocytes Relative: 9 %
Neutro Abs: 8.3 10*3/uL — ABNORMAL HIGH (ref 1.7–7.7)
Neutrophils Relative %: 70 %
Platelets: 331 10*3/uL (ref 150–400)
RBC: 2.66 MIL/uL — ABNORMAL LOW (ref 3.87–5.11)
RDW: 17.7 % — ABNORMAL HIGH (ref 11.5–15.5)
WBC: 11.6 10*3/uL — ABNORMAL HIGH (ref 4.0–10.5)
nRBC: 0 % (ref 0.0–0.2)

## 2022-09-10 LAB — COMPREHENSIVE METABOLIC PANEL
ALT: 6 U/L (ref 0–44)
AST: 21 U/L (ref 15–41)
Albumin: 2.2 g/dL — ABNORMAL LOW (ref 3.5–5.0)
Alkaline Phosphatase: 73 U/L (ref 38–126)
Anion gap: 7 (ref 5–15)
BUN: 22 mg/dL (ref 8–23)
CO2: 19 mmol/L — ABNORMAL LOW (ref 22–32)
Calcium: 7.2 mg/dL — ABNORMAL LOW (ref 8.9–10.3)
Chloride: 110 mmol/L (ref 98–111)
Creatinine, Ser: 1.27 mg/dL — ABNORMAL HIGH (ref 0.44–1.00)
GFR, Estimated: 42 mL/min — ABNORMAL LOW (ref >60)
Glucose, Bld: 82 mg/dL (ref 70–99)
Potassium: 4.1 mmol/L (ref 3.5–5.1)
Sodium: 136 mmol/L (ref 135–145)
Total Bilirubin: 0.5 mg/dL (ref 0.3–1.2)
Total Protein: 5.9 g/dL — ABNORMAL LOW (ref 6.5–8.1)

## 2022-09-10 LAB — MAGNESIUM: Magnesium: 2.1 mg/dL (ref 1.7–2.4)

## 2022-09-10 LAB — PHOSPHORUS: Phosphorus: 2.5 mg/dL (ref 2.5–4.6)

## 2022-09-10 MED ORDER — IPRATROPIUM BROMIDE 0.02 % IN SOLN
0.5000 mg | Freq: Three times a day (TID) | RESPIRATORY_TRACT | Status: DC
  Administered 2022-09-11 – 2022-09-12 (×4): 0.5 mg via RESPIRATORY_TRACT
  Filled 2022-09-10 (×4): qty 2.5

## 2022-09-10 MED ORDER — LEVALBUTEROL HCL 0.63 MG/3ML IN NEBU
0.6300 mg | INHALATION_SOLUTION | Freq: Four times a day (QID) | RESPIRATORY_TRACT | Status: DC
  Administered 2022-09-10 (×2): 0.63 mg via RESPIRATORY_TRACT
  Filled 2022-09-10 (×2): qty 3

## 2022-09-10 MED ORDER — AZITHROMYCIN 250 MG PO TABS
500.0000 mg | ORAL_TABLET | Freq: Every day | ORAL | Status: DC
  Administered 2022-09-10 – 2022-09-12 (×3): 500 mg via ORAL
  Filled 2022-09-10 (×3): qty 2

## 2022-09-10 MED ORDER — CEFAZOLIN SODIUM-DEXTROSE 1-4 GM/50ML-% IV SOLN
1.0000 g | Freq: Three times a day (TID) | INTRAVENOUS | Status: DC
  Administered 2022-09-10 – 2022-09-12 (×5): 1 g via INTRAVENOUS
  Filled 2022-09-10 (×5): qty 50

## 2022-09-10 MED ORDER — IPRATROPIUM BROMIDE 0.02 % IN SOLN
0.5000 mg | Freq: Four times a day (QID) | RESPIRATORY_TRACT | Status: DC
  Administered 2022-09-10 (×2): 0.5 mg via RESPIRATORY_TRACT
  Filled 2022-09-10 (×2): qty 2.5

## 2022-09-10 MED ORDER — LEVALBUTEROL HCL 0.63 MG/3ML IN NEBU
0.6300 mg | INHALATION_SOLUTION | Freq: Three times a day (TID) | RESPIRATORY_TRACT | Status: DC
  Administered 2022-09-11 – 2022-09-12 (×4): 0.63 mg via RESPIRATORY_TRACT
  Filled 2022-09-10 (×4): qty 3

## 2022-09-10 MED ORDER — SODIUM BICARBONATE 650 MG PO TABS
650.0000 mg | ORAL_TABLET | Freq: Two times a day (BID) | ORAL | Status: DC
  Administered 2022-09-10 – 2022-09-12 (×5): 650 mg via ORAL
  Filled 2022-09-10 (×5): qty 1

## 2022-09-10 NOTE — Progress Notes (Signed)
   09/10/22 1016  Assess: MEWS Score  Temp 98.4 F (36.9 C)  BP (!) 149/73  MAP (mmHg) 94  Pulse Rate 95  Resp (!) 27  SpO2 98 %  O2 Device Nasal Cannula  Assess: MEWS Score  MEWS Temp 0  MEWS Systolic 0  MEWS Pulse 0  MEWS RR 2  MEWS LOC 0  MEWS Score 2  MEWS Score Color Yellow  Assess: if the MEWS score is Yellow or Red  Were vital signs taken at a resting state? Yes  Focused Assessment No change from prior assessment  Does the patient meet 2 or more of the SIRS criteria? Yes  Does the patient have a confirmed or suspected source of infection? Yes  Provider and Rapid Response Notified? Yes  MEWS guidelines implemented *See Row Information* Yes  Treat  Pain Scale 0-10  Pain Score 10  Pain Type Acute pain  Pain Location Eye  Pain Orientation Right  Pain Descriptors / Indicators Aching  Pain Frequency Constant  Pain Onset On-going  Patients Stated Pain Goal 3  Pain Intervention(s) Medication (See eMAR)  Take Vital Signs  Increase Vital Sign Frequency  Yellow: Q 2hr X 2 then Q 4hr X 2, if remains yellow, continue Q 4hrs  Escalate  MEWS: Escalate Yellow: discuss with charge nurse/RN and consider discussing with provider and RRT  Notify: Charge Nurse/RN  Name of Charge Nurse/RN Notified Geoffry Paradise RN  Date Charge Nurse/RN Notified 09/10/22  Time Charge Nurse/RN Notified 1016  Notify: Provider  Provider Name/Title Raiford Noble MD  Date Provider Notified 09/10/22  Time Provider Notified 1025  Method of Notification Call  Notification Reason Change in status;Other (Comment) (increase RR)  Provider response Other (Comment) (previous orders placed)  Date of Provider Response 09/10/22  Time of Provider Response 1025  Document  Patient Outcome Stabilized after interventions  Assess: SIRS CRITERIA  SIRS Temperature  0  SIRS Pulse 1  SIRS Respirations  1  SIRS WBC 1  SIRS Score Sum  3   Patient c/o SOB at rest, noted respiration  fast and labored at 27/min,  however still able to finish sentences. O2 sat 97 % at RA and 100 @ 2 L/min, HR 95. Positioned pt in high fowlers position, RT notified. MD aware and ordered CXR and already done result pending. Will monitor patient closely.

## 2022-09-10 NOTE — Progress Notes (Signed)
PHARMACY NOTE:  ANTIMICROBIAL RENAL DOSAGE ADJUSTMENT  Current antimicrobial regimen includes a mismatch between antimicrobial dosage and estimated renal function.  As per policy approved by the Pharmacy & Therapeutics and Medical Executive Committees, the antimicrobial dosage will be adjusted accordingly.  Current antimicrobial dosage:  Cefazolin 1g IV q12h  Indication: UTI  Renal Function:  Estimated Creatinine Clearance: 34.5 mL/min (A) (by C-G formula based on SCr of 1.27 mg/dL (H)). []      On intermittent HD, scheduled: []      On CRRT    Antimicrobial dosage has been changed to:  Cefazoin 1g IV q8h   Thank you for allowing pharmacy to be a part of this patient's care.  Luiz Ochoa, [facility] 09/10/2022 2:44 PM

## 2022-09-10 NOTE — Progress Notes (Signed)
Pt ambulated for short distance in room on room air. Pt 02 sat was 95 percent on room air, though pt was short of breath with minimal exertion (she reports this is baseline for her). Pt was also very weak with ambulation.

## 2022-09-10 NOTE — Progress Notes (Signed)
PT demonstrated hands on understanding of Flutter device. Productive cough at this time. 

## 2022-09-10 NOTE — Progress Notes (Signed)
Md notified of effectiveness of pt breathing treatments and overall pt condition.

## 2022-09-10 NOTE — Progress Notes (Signed)
PROGRESS NOTE    EMELEE Byrd  JAS:[id number] DOB: 1941-07-03 DOA: 09/04/2022 PCP: [name], PA-C   Brief Narrative:  Cindy Byrd is a 81 y.o. female with past medical history significant for metastatic lung cancer to bone, esophageal cancer, essential hypertension, paroxysmal atrial fibrillation, history of DVT/PE who self discontinued Xarelto due to cost presented to [facility] ED on 10/1 via ambulance complaining of multiple falls, neck pain, and generalized weakness.  Patient was recently diagnosed with herpes zoster ophthalmicus and is being followed by ophthalmology at [facility]  Patient lives alone but has a friend staying with her and utilizes a walker at baseline.  Denies fever/chills.   In the ED, temperature 98.3 F, HR 87, RR 18, BP 111/66, SPO2 96% on room air.  WBC 15.7, hemoglobin 11.3, platelets 166.  Sodium 137, potassium 3.8, chloride 109, CO2 22, BUN 28, creatinine 1.22, glucose 150.  AST 41, ALT 25, total bilirubin 0.7.  Lipase 26.  Influenza A/B PCR negative.  COVID-19 PCR negative.  Urinalysis with small leukocytes, positive nitrite, many bacteria, 11-20 WBCs.  CT head/C-spine without contrast with no evidence of acute intracranial abnormality, atrophy with small vessel ischemic changes, no evidence of traumatic injury to the cervical spine with mild multilevel degenerative changes.  Chest x-ray with no active cardiopulmonary disease process.  EDP consulted [facility] for admission for further evaluation and treatment of generalized weakness, recurrent falls and urinary tract infection.   Patient's renal function worsened in the setting of hypotension and she now has a slight AKI and will continue IV fluid hydration and give her another bolus.  Overnight she received a 500 mill bolus for hypotension.  Oxycodone has been changed to p.o. tramadol.  PT OT evaluated and still recommending SNF but she is refusing and wants to go home with home health.   Patient was to go home today  but started having this productive cough and when ambulatory home O2 screen was done she became a little bit more tachypneic and started to cough quite a bit.  She was placed back on oxygen and nebs were ordered and her fluid has been stopped.  We will watch her respiratory status carefully prior to discharging and may watch her another day and if she is stable will discharge her tomorrow   Assessment and Plan: Klebsiella pneumonia UTI, improving Urinalysis with small leukocytes, positive nitrite, many bacteria, 11-20 WBCs.  WBC count elevated on admission 15.7. --WBC 15.7>13.3>11.5 and now WBC is 11.3 the day before yesterday and yesterday is 10.9 and now WBC is 11.6 again --Urine culture: >100K Klebsiella pneumonia, susceptibilities pending --Ceftriaxone 1 g IV every 24 hours and will de-esclate to IV Cefazolin and continue with pharmacy dosing and will change to p.o. cefdinir at discharge -Continue to monitor CBCs daily   Herpes zoster ophthalmicus of right eye -Follows with [facility], Dr. Gershon Byrd.   -Continued erythromycin ophthalmologic ointment right eye every 6 hours however after discussion with ophthalmology they recommended Maxitrol ointment 6 times daily, Valtrex 500 mg p.o. twice daily.   -Outpatient follow-up with ophthalmology and I spoke with Dr. Alanda Byrd and he recommends her following up within 1 week. -We will start gabapentin 100 mg 3 times daily -Patient was complaining about significant pain so we will stop her oxycodone and change her to tramadol given her hypotension   AKI on CKD stage IIIb Metabolic Acidosis, mild Baseline creatinine 1.2-1.5, review/creatinine was stable but then suddenly worsened likely in the setting  of hypotension and BUN/creatinine went from 18/0.98 and trended all the way up to 24/1.71 is now trending back down today at 22/1.27 -Patient's CO2 is now 19, AG is 7, and Chloride Level is now 110 -She was on IV fluid  hydration maintenance and received a few boluses the last few days but given her productive coughing and mild dyspnea we have stopped the fluids -Patient was hypotensive throughout the day yesterday and overnight likely contributing to her AKI -We will obtain a repeat urinalysis and this showed an amber color with negative glucose, negative hemoglobin, negative nitrites, negative leukocytes, rare bacteria, 0-5 RBCs per high-power field, 0-5 squamous epithelial cells and 0-5 WBCs -Renal ultrasound done and showed "Normal ultrasound appearance of the kidneys and bladder. No hydronephrosis." -Avoid nephrotoxic medications, contrast dyes, hypotension and dehydration to ensure adequate renal perfusion and will need to renally dose medications -Repeat CMP in a.m. and if necessary and worsening will need to call nephrology for further evaluation renal function is improving   Hx pulmonary embolism/DVT -Patient previously on Xarelto, but has self discontinued this medication stating that it was too expensive for her to afford and she does not wish to restart anticoagulation. -Last CT angiogram chest in the EMR on 02/25/2019 with no pulmonary embolism evident.  Vascular duplex ultrasound 09/03/2016 with acute DVT.   -Recommend outpatient follow-up with her PCP/specialist.   Paroxysmal atrial fibrillation -Previously on Xarelto, now self discontinued as above. -We will need outpatient follow-up with PCP   Emphysema and associated cough -Respiratory status has been relatively stable and she had no evidence of wheezing but she did have some crackles today and has been coughing productive sputum -Continue monitor respiratory status carefully but she is now on oxygen -SpO2: 100 % O2 Flow Rate (L/min): 1 L/min -CXR done and showed "Bronchitic changes with bibasilar atelectasis. Aortic Atherosclerosis " -I have added Xopenex and Atrovent as well and stopped her IV fluid hydration; may benefit from a a small  amount of Lasix -Continue monitor respiratory status carefully and wean O2 as tolerated and will need an ambulatory home O2 screen prior to discharge -Repeat chest x-ray today showed "No acute findings and no change from the previous day's exam." -Patient has a very productive cough and will need to continue monitor and will add azithromycin just in case she has a bronchitis -We will add flutter valve, guaifenesin 1200 mg p.o. twice daily as well as incentive spirometry   Macrocytic Anemia Patient's hemoglobin/hematocrit has been relatively stable for the last few days but slight drop likely in the setting of dilution and is now 8.6/27.2 with a MCV of 102.3 -Check anemia panel in a.m. -Continue to monitor for signs and symptoms bleeding; no overt bleeding noted -Repeat CBC in a.m.   Hypoalbuminemia -Patient's albumin level has gone from 2.4 -> 2.0 -> 2.2 -Continue monitor and trend and repeat CMP in a.m.    Depression/Anxiety: -Continue with Wellbutrin 150 mg p.o. daily and Lexapro 20 mg p.o. daily   GERD -C/w PPI Pantoprazole 40 mg p.o. daily   Metastatic non-small cell lung cancer with metastasis to left ilium Adenocarcinoma of the esophagus -Follows with medical oncology outpatient, Dr. Irene Limbo.   -Currently on active surveillance, Xgeva q12 weeks for bone metastasis and Sandostatin q4 weeks for diarrhea.   -Outpatient follow-up with oncology.   Weakness/debility/multiple falls: --PT recommending home health initially but now recommending SNF --OT consult: Recommending home health -Follow-up with [facility] for discharge disposition and SNF was recommended but she  does not want to go to SNF so we will be going home with home health  DVT prophylaxis: heparin injection 5,000 Units Start: 09/05/22 0600 SCDs Start: 09/05/22 0143    Code Status: DNR Family Communication: No family currently at bedside  Disposition Plan:  Level of care: Med-Surg Status is: Inpatient Remains inpatient  appropriate because: She is slowly improving but had a productive cough and became little tachypneic when attempting to wean oxygen.  We will further try to wean her oxygen later today and provide her some breathing treatments and monitor respiratory status overnight   Consultants:  Discussed with ophthalmology  Procedures:  As delineated as above  Antimicrobials:  Anti-infectives (From admission, onward)    Start     Dose/Rate Route Frequency Ordered Stop   09/10/22 1800  ceFAZolin (ANCEF) IVPB 1 g/50 mL premix        1 g 100 mL/hr over 30 Minutes Intravenous Every 8 hours 09/10/22 1444     09/10/22 1045  azithromycin (ZITHROMAX) tablet 500 mg        500 mg Oral Daily 09/10/22 0957 09/15/22 0959   09/10/22 1000  ceFAZolin (ANCEF) IVPB 1 g/50 mL premix  Status:  Discontinued        1 g 100 mL/hr over 30 Minutes Intravenous Every 12 hours 09/09/22 1535 09/10/22 1444   09/07/22 2200  ceFAZolin (ANCEF) IVPB 1 g/50 mL premix  Status:  Discontinued        1 g 100 mL/hr over 30 Minutes Intravenous Every 12 hours 09/07/22 1459 09/07/22 1627   09/07/22 2200  ceFAZolin (ANCEF) IVPB 1 g/50 mL premix        1 g 100 mL/hr over 30 Minutes Intravenous Every 8 hours 09/07/22 1627 09/09/22 2210   09/05/22 1000  fluconazole (DIFLUCAN) tablet 200 mg        200 mg Oral Daily 09/05/22 0143     09/05/22 1000  valACYclovir (VALTREX) tablet 500 mg        500 mg Oral 2 times daily 09/05/22 0143     09/05/22 0900  cefTRIAXone (ROCEPHIN) 1 g in sodium chloride 0.9 % 100 mL IVPB  Status:  Discontinued        1 g 200 mL/hr over 30 Minutes Intravenous Every 24 hours 09/05/22 0143 09/05/22 0144   09/04/22 2300  cefTRIAXone (ROCEPHIN) 1 g in sodium chloride 0.9 % 100 mL IVPB  Status:  Discontinued        1 g 200 mL/hr over 30 Minutes Intravenous Every 24 hours 09/04/22 2259 09/07/22 1459        Subjective: Seen and examined at bedside and she was coughing a lot more today and bringing up some productive  sputum.  She states her pain is doing okay and states that she was eating a little bit better today.  Continues to refuse SNF.  Attempted to wean her off the oxygen but she continued to cough and became tachypneic so have ordered her breathing treatments with improvement.  We will watch her overnight and continue breathing treatments and if stable can likely be discharged home the next 24 to 48 hours.  Objective: Vitals:   09/10/22 0536 09/10/22 1016 09/10/22 1147 09/10/22 1338  BP: 132/60 (!) 149/73 (!) 141/60 127/62  Pulse: 85 95 93 83  Resp: 18 (!) 27 (!) 21 (!) 23  Temp: 97.8 F (36.6 C) 98.4 F (36.9 C) 98.3 F (36.8 C) 98 F (36.7 C)  TempSrc:  Oral Oral Oral  SpO2: 95% 98% 100% 100%  Weight:      Height:        Intake/Output Summary (Last 24 hours) at 09/10/2022 1454 Last data filed at 09/10/2022 8338 Gross per 24 hour  Intake 1639.6 ml  Output 825 ml  Net 814.6 ml   Filed Weights   09/07/22 0500 09/08/22 0500 09/09/22 0408  Weight: 66.5 kg 66.6 kg 66.9 kg    Examination: Physical Exam:  Constitutional: Thin elderly Caucasian female currently in no acute distress but she is coughing quite a bit more Respiratory: Diminished to auscultation bilaterally with coarse breath sounds and some slight crackles., no wheezing, rales, rhonchi. Normal respiratory effort and patient is not tachypenic. No accessory muscle use.  Wearing supplemental oxygen via nasal cannula at 1 L and she is coughing a lot more which sounds productive Cardiovascular: RRR, no murmurs / rubs / gallops. S1 and S2 auscultated. No extremity edema.  Abdomen: Soft, non-tender, non-distended.  Bowel sounds positive.  GU: Deferred. Musculoskeletal: No clubbing / cyanosis of digits/nails. No joint deformity upper and lower extremities.  Skin: Has right eye herpes zoster ophthalmicus with some erythema and drainage coming from the right eye.  Neurologic: CN 2-12 grossly intact with no focal deficits. Romberg sign  cerebellar reflexes not assessed.  Psychiatric: Normal judgment and insight. Alert and oriented x 3. Normal mood and appropriate affect.   Data Reviewed: I have personally reviewed following labs and imaging studies  CBC: Recent Labs  Lab 09/04/22 1930 09/05/22 0355 09/06/22 0401 09/07/22 0432 09/08/22 0434 09/09/22 0414 09/10/22 0301  WBC 15.7* 13.3* 11.5* 11.3* 10.9* 11.6* 11.6*  NEUTROABS 12.0* 9.2*  --   --  6.9 7.9* 8.3*  HGB 11.3* 10.1* 10.5* 10.3* 11.0* 9.6* 8.6*  HCT 35.2* 30.7* 32.0* 33.0* 33.8* 31.1* 27.2*  MCV 101.7* 100.0 100.6* 107.1* 102.1* 106.1* 102.3*  PLT 166 156 181 222 297 301 250   Basic Metabolic Panel: Recent Labs  Lab 09/06/22 0401 09/07/22 0432 09/08/22 0434 09/09/22 0414 09/10/22 0301  NA 140 135 139 139 136  K 3.6 4.0 4.9 4.9 4.1  CL 109 104 104 108 110  CO2 23 20* 24 22 19*  GLUCOSE 101* 76 92 83 82  BUN 18 14 18  24* 22  CREATININE 0.98 1.03* 1.17* 1.71* 1.27*  CALCIUM 8.7* 8.3* 9.0 7.8* 7.2*  MG  --  1.7 2.3 2.2 2.1  PHOS  --   --  4.4 4.8* 2.5   GFR: Estimated Creatinine Clearance: 34.5 mL/min (A) (by C-G formula based on SCr of 1.27 mg/dL (H)). Liver Function Tests: Recent Labs  Lab 09/04/22 1930 09/05/22 0355 09/08/22 0434 09/09/22 0414 09/10/22 0301  AST 41 34 19 30 21   ALT 25 23 14 7 6   ALKPHOS 67 62 75 70 73  BILITOT 0.7 0.5 0.7 1.1 0.5  PROT 6.7 6.0* 6.9 5.9* 5.9*  ALBUMIN 2.6* 2.4* 2.4* 2.0* 2.2*   Recent Labs  Lab 09/04/22 1930  LIPASE 26   No results for input(s): "AMMONIA" in the last 168 hours. Coagulation Profile: No results for input(s): "INR", "PROTIME" in the last 168 hours. Cardiac Enzymes: No results for input(s): "CKTOTAL", "CKMB", "CKMBINDEX", "TROPONINI" in the last 168 hours. BNP (last 3 results) No results for input(s): "PROBNP" in the last 8760 hours. HbA1C: No results for input(s): "HGBA1C" in the last 72 hours. CBG: No results for input(s): "GLUCAP" in the last 168 hours. Lipid  Profile: No results for input(s): "CHOL", "HDL", "LDLCALC", "TRIG", "CHOLHDL", "LDLDIRECT"  in the last 72 hours. Thyroid Function Tests: No results for input(s): "TSH", "T4TOTAL", "FREET4", "T3FREE", "THYROIDAB" in the last 72 hours. Anemia Panel: No results for input(s): "VITAMINB12", "FOLATE", "FERRITIN", "TIBC", "IRON", "RETICCTPCT" in the last 72 hours. Sepsis Labs: No results for input(s): "PROCALCITON", "LATICACIDVEN" in the last 168 hours.  Recent Results (from the past 240 hour(s))  Resp Panel by RT-PCR (Flu A&B, Covid) Anterior Nasal Swab     Status: None   Collection Time: 09/04/22  8:45 PM   Specimen: Anterior Nasal Swab  Result Value Ref Range Status   SARS Coronavirus 2 by RT PCR NEGATIVE NEGATIVE Final    Comment: (NOTE) SARS-CoV-2 target nucleic acids are NOT DETECTED.  The SARS-CoV-2 RNA is generally detectable in upper respiratory specimens during the acute phase of infection. The lowest concentration of SARS-CoV-2 viral copies this assay can detect is 138 copies/mL. A negative result does not preclude SARS-Cov-2 infection and should not be used as the sole basis for treatment or other patient management decisions. A negative result may occur with  improper specimen collection/handling, submission of specimen other than nasopharyngeal swab, presence of viral mutation(s) within the areas targeted by this assay, and inadequate number of viral copies(<138 copies/mL). A negative result must be combined with clinical observations, patient history, and epidemiological information. The expected result is Negative.  Fact Sheet for Patients:  EntrepreneurPulse.com.au  Fact Sheet for Healthcare Providers:  IncredibleEmployment.be  This test is no t yet approved or cleared by the [place] FDA and  has been authorized for detection and/or diagnosis of SARS-CoV-2 by FDA under an Emergency Use Authorization (EUA). This EUA will remain   in effect (meaning this test can be used) for the duration of the COVID-19 declaration under Section 564(b)(1) of the Act, 21 U.S.C.section 360bbb-3(b)(1), unless the authorization is terminated  or revoked sooner.       Influenza A by PCR NEGATIVE NEGATIVE Final   Influenza B by PCR NEGATIVE NEGATIVE Final    Comment: (NOTE) The Xpert Xpress SARS-CoV-2/FLU/RSV plus assay is intended as an aid in the diagnosis of influenza from Nasopharyngeal swab specimens and should not be used as a sole basis for treatment. Nasal washings and aspirates are unacceptable for Xpert Xpress SARS-CoV-2/FLU/RSV testing.  Fact Sheet for Patients: EntrepreneurPulse.com.au  Fact Sheet for Healthcare Providers: IncredibleEmployment.be  This test is not yet approved or cleared by the [place] FDA and has been authorized for detection and/or diagnosis of SARS-CoV-2 by FDA under an Emergency Use Authorization (EUA). This EUA will remain in effect (meaning this test can be used) for the duration of the COVID-19 declaration under Section 564(b)(1) of the Act, 21 U.S.C. section 360bbb-3(b)(1), unless the authorization is terminated or revoked.  Performed at [facility], [place] [address], [place], [place] [address]   Urine Culture     Status: Abnormal   Collection Time: 09/04/22 10:06 PM   Specimen: In/Out Cath Urine  Result Value Ref Range Status   Specimen Description   Final    IN/OUT CATH URINE Performed at [facility] [address], [place], [place] [address]    Special Requests   Final    NONE Performed at [facility], [place] [address] [place], [place] [address]    Culture >=100,000 COLONIES/mL KLEBSIELLA PNEUMONIAE (A)  Final   Report Status 09/07/2022 FINAL  Final   Organism ID, Bacteria KLEBSIELLA PNEUMONIAE (A)  Final      Susceptibility   Klebsiella pneumoniae -  MIC*    AMPICILLIN >=32  RESISTANT Resistant     CEFAZOLIN <=4 SENSITIVE Sensitive     CEFEPIME <=0.12 SENSITIVE Sensitive     CEFTRIAXONE <=0.25 SENSITIVE Sensitive     CIPROFLOXACIN <=0.25 SENSITIVE Sensitive     GENTAMICIN <=1 SENSITIVE Sensitive     IMIPENEM <=0.25 SENSITIVE Sensitive     NITROFURANTOIN 128 RESISTANT Resistant     TRIMETH/SULFA <=20 SENSITIVE Sensitive     AMPICILLIN/SULBACTAM 16 INTERMEDIATE Intermediate     PIP/TAZO 16 SENSITIVE Sensitive     * >=100,000 COLONIES/mL KLEBSIELLA PNEUMONIAE    Radiology Studies: DG CHEST PORT 1 VIEW  Result Date: 09/10/2022 CLINICAL DATA:  Short of breath. History of metastatic lung cancer and esophageal cancer. Hypertension. EXAM: PORTABLE CHEST 1 VIEW COMPARISON:  09/09/2022. FINDINGS: Cardiac silhouette is normal in size. No mediastinal or hilar masses. Stable right anterior chest wall Port-A-Cath. Mild lung base opacities, left greater than right, consistent with atelectasis, without change from the previous day's study. Remainder of the lungs is clear. No convincing pleural effusion and no pneumothorax. Skeletal structures are grossly intact. IMPRESSION: 1. No acute findings and no change from the previous day's exam. Electronically Signed   By: Lajean Manes M.D.   On: 09/10/2022 11:29   US RENAL  Result Date: 09/09/2022 CLINICAL DATA:  Acute kidney injury EXAM: RENAL / URINARY TRACT ULTRASOUND COMPLETE COMPARISON:  CT 05/12/2022 FINDINGS: Right Kidney: Renal measurements: 3.8 x 4.5 by 11.3 cm = volume: 101 mL. Echogenicity within normal limits. No mass or hydronephrosis visualized. Left Kidney: Renal measurements: 10.4 x 5.2 x 4 cm = volume: 112 mL. Echogenicity within normal limits. No mass or hydronephrosis visualized. Bladder: Appears normal for degree of bladder distention. Other: None. IMPRESSION: Normal ultrasound appearance of the kidneys and bladder. No hydronephrosis. Electronically Signed   By: Lucienne Capers M.D.   On: 09/09/2022 20:28   DG CHEST  PORT 1 VIEW  Result Date: 09/09/2022 CLINICAL DATA:  Shortness of breath, metastatic lung cancer to bone, esophageal cancer, hypertension, atrial fibrillation EXAM: PORTABLE CHEST 1 VIEW COMPARISON:  Portable exam 1352 hours compared to 09/08/2022 FINDINGS: RIGHT jugular Port-A-Cath with tip projecting over SVC. Normal heart size, mediastinal contours, and pulmonary vascularity. Atherosclerotic calcification aorta. Bibasilar atelectasis and mild central bronchitic changes. No pulmonary infiltrate, pleural effusion, or pneumothorax. Sclerotic focus at anterior RIGHT fifth rib corresponding to sclerotic focus on prior CT. IMPRESSION: Bronchitic changes with bibasilar atelectasis. Aortic Atherosclerosis (ICD10-I70.0). Electronically Signed   By: Lavonia Dana M.D.   On: 09/09/2022 14:21   DG Chest Port 1 View  Result Date: 09/08/2022 CLINICAL DATA:  Fevers EXAM: PORTABLE CHEST 1 VIEW COMPARISON:  09/04/2022 FINDINGS: Right chest wall port is noted and stable. Cardiac shadow is stable. Aortic calcifications are seen. Lungs are well aerated bilaterally. Minimal platelike atelectasis is noted in the left base. No acute bony abnormality is seen. Old rib fractures are again noted. IMPRESSION: Mild left basilar atelectasis. Electronically Signed   By: Inez Catalina M.D.   On: 09/08/2022 21:59    Scheduled Meds:  azithromycin  500 mg Oral Daily   buPROPion  150 mg Oral Daily   escitalopram  20 mg Oral Daily   fluconazole  200 mg Oral Daily   gabapentin  100 mg Oral TID   guaiFENesin  1,200 mg Oral BID   heparin  5,000 Units Subcutaneous Q8H   ipratropium  0.5 mg Nebulization Q6H   levalbuterol  0.63 mg Nebulization Q6H   neomycin-polymyxin  b-dexamethasone   Right Eye 6 X Daily   pantoprazole  40 mg Oral Daily   sodium bicarbonate  650 mg Oral BID   traZODone  50 mg Oral QHS   valACYclovir  500 mg Oral BID   Continuous Infusions:   ceFAZolin (ANCEF) IV      LOS: 5 days   Raiford Noble, DO [facility] Available via Epic secure chat 7am-7pm After these hours, please refer to coverage provider listed on [email or web address] 09/10/2022, 2:54 PM

## 2022-09-11 LAB — COMPREHENSIVE METABOLIC PANEL
ALT: <5 U/L (ref 0–44)
AST: 23 U/L (ref 15–41)
Albumin: 2.2 g/dL — ABNORMAL LOW (ref 3.5–5.0)
Alkaline Phosphatase: 85 U/L (ref 38–126)
Anion gap: 7 (ref 5–15)
BUN: 15 mg/dL (ref 8–23)
CO2: 20 mmol/L — ABNORMAL LOW (ref 22–32)
Calcium: 7.4 mg/dL — ABNORMAL LOW (ref 8.9–10.3)
Chloride: 110 mmol/L (ref 98–111)
Creatinine, Ser: 0.94 mg/dL (ref 0.44–1.00)
GFR, Estimated: >60 mL/min (ref >60)
Glucose, Bld: 91 mg/dL (ref 70–99)
Potassium: 4 mmol/L (ref 3.5–5.1)
Sodium: 137 mmol/L (ref 135–145)
Total Bilirubin: 0.7 mg/dL (ref 0.3–1.2)
Total Protein: 6.1 g/dL — ABNORMAL LOW (ref 6.5–8.1)

## 2022-09-11 LAB — CBC WITH DIFFERENTIAL/PLATELET
Abs Immature Granulocytes: 0.05 10*3/uL (ref 0.00–0.07)
Basophils Absolute: 0 10*3/uL (ref 0.0–0.1)
Basophils Relative: 0 %
Eosinophils Absolute: 0.1 10*3/uL (ref 0.0–0.5)
Eosinophils Relative: 1 %
HCT: 26.5 % — ABNORMAL LOW (ref 36.0–46.0)
Hemoglobin: 8.8 g/dL — ABNORMAL LOW (ref 12.0–15.0)
Immature Granulocytes: 1 %
Lymphocytes Relative: 15 %
Lymphs Abs: 1.6 10*3/uL (ref 0.7–4.0)
MCH: 32.8 pg (ref 26.0–34.0)
MCHC: 33.2 g/dL (ref 30.0–36.0)
MCV: 98.9 fL (ref 80.0–100.0)
Monocytes Absolute: 1 10*3/uL (ref 0.1–1.0)
Monocytes Relative: 10 %
Neutro Abs: 7.8 10*3/uL — ABNORMAL HIGH (ref 1.7–7.7)
Neutrophils Relative %: 73 %
Platelets: 402 10*3/uL — ABNORMAL HIGH (ref 150–400)
RBC: 2.68 MIL/uL — ABNORMAL LOW (ref 3.87–5.11)
RDW: 17.4 % — ABNORMAL HIGH (ref 11.5–15.5)
WBC: 10.7 10*3/uL — ABNORMAL HIGH (ref 4.0–10.5)
nRBC: 0.4 % — ABNORMAL HIGH (ref 0.0–0.2)

## 2022-09-11 LAB — MAGNESIUM: Magnesium: 2 mg/dL (ref 1.7–2.4)

## 2022-09-11 LAB — PHOSPHORUS: Phosphorus: 1.9 mg/dL — ABNORMAL LOW (ref 2.5–4.6)

## 2022-09-11 MED ORDER — BISACODYL 10 MG RE SUPP: 10.0000 mg | Freq: Once | RECTAL | Status: DC

## 2022-09-11 MED ORDER — ENSURE ENLIVE PO LIQD
237.0000 mL | Freq: Two times a day (BID) | ORAL | Status: DC
  Administered 2022-09-11 (×2): 237 mL via ORAL

## 2022-09-11 MED ORDER — POTASSIUM PHOSPHATES 15 MMOLE/5ML IV SOLN
15.0000 mmol | Freq: Once | INTRAVENOUS | Status: AC
  Administered 2022-09-11: 15 mmol via INTRAVENOUS
  Filled 2022-09-11: qty 5

## 2022-09-11 NOTE — Plan of Care (Signed)
°  Problem: Clinical Measurements: °Goal: Respiratory complications will improve °Outcome: Progressing °  °Problem: Clinical Measurements: °Goal: Cardiovascular complication will be avoided °Outcome: Progressing °  °Problem: Pain Managment: °Goal: General experience of comfort will improve °Outcome: Progressing °  °Problem: Safety: °Goal: Ability to remain free from injury will improve °Outcome: Progressing °  °

## 2022-09-11 NOTE — Progress Notes (Addendum)
PROGRESS NOTE    Cindy Byrd  KDX:[id number]  DOB: 1941-12-01  DOA: 09/04/2022 PCP: [name], PA-C Outpatient Specialists:   Hospital course:  81 y.o. female with past medical history significant for metastatic lung cancer to bone, esophageal cancer, essential hypertension, paroxysmal atrial fibrillation, history of DVT/PE who self discontinued Xarelto due to cost was admitted with multiple falls and generalized weakness with hypotension.  She was diagnosed with Klebsiella UTI and started on ceftriaxone.  She was also recently diagnosed with V1 zoster.  Patient was seen by PT who recommended SNF placement however patient adamantly refuses to go to rehab and is clear that she wants to go home regardless of how weak she is.  Subjective:  Patient was seen with her friend who will be staying with her at home for a few days after discharge and with her RN.  Patient states she would like to go home.  Patient friend states that she will do her best to help her but will not be able to pull her up out of bed due to her own neck problems.  However she feels that if the patient can walk she will be able to help her at home.  Patient herself states that she is going to go home regardless of whether or not she can walk. RN notes that she is much better today than she was yesterday--feels that treatment with azithromycin and inhaled bronchodilators has helped with the patient's strength and exercise tolerance.  Patient was apparently able to walk to the door yesterday and looks somewhat even stronger today.  She feels that patient would benefit from ongoing treatment for COPD prior to discharge.   Objective: Vitals:   09/11/22 0605 09/11/22 0717 09/11/22 1405 09/11/22 1453  BP:   133/61   Pulse:  91 84 94  Resp:  18 17 16   Temp:   97.7 F (36.5 C)   TempSrc:      SpO2:  98% 100% 96%  Weight: 68.6 kg     Height:        Intake/Output Summary (Last 24 hours) at 09/11/2022 1506 Last  data filed at 09/11/2022 1437 Gross per 24 hour  Intake 1580.03 ml  Output 1000 ml  Net 580.03 ml   Filed Weights   09/08/22 0500 09/09/22 0408 09/11/22 0605  Weight: 66.6 kg 66.9 kg 68.6 kg     Exam:  General: Chronically ill-appearing female with clear V1 zoster but no evidence of cellulitis sitting up in bed with attentive friend at bedside. Eyes: sclera anicteric, conjuctiva mild injection bilaterally CVS: S1-S2, regular  Respiratory:  decreased air entry bilaterally secondary to decreased inspiratory effort, rales at bases  GI: NABS, soft, NT  Skin: Clear right-sided V1 zoster with no cellulitis, appears to be crusting over  Assessment & Plan:   COPD flare/bronchitis Patient is apparently improving on initiation of azithromycin and inhaled bronchodilators per nurse and patient.  They both feel that she will be stronger tomorrow after further treatment.  They both feel that she would be safe for discharge tomorrow with hopefully gaining more strength.  Weakness and falls Patient continues to refuse rehab placement Discussed with her and her friend at length our concerns for patient's falling which I noted could be catastrophic and resulted in fractures. However patient said that she is going to go home and absolutely will not be going to a rehab facility.  Hypophosphatemia We will replace  AKI Resolved with treatment of infection and IV fluid  resuscitation  Klebsiella UTI Patient responded well to ceftriaxone, was de-escalated to cefazolin Plan is to change to cefdinir at discharge  H/oh VTE Patient refuses anticoagulation, will need to be followed as an outpatient  PAF Rate is controlled off rate control medication Anticoagulation per discussion with PCP and shared decision making  Anxiety and depression Continue escitalopram, bupropion and trazodone    Scheduled Meds:  azithromycin  500 mg Oral Daily   bisacodyl  10 mg Rectal Once   buPROPion  150 mg Oral  Daily   escitalopram  20 mg Oral Daily   feeding supplement  237 mL Oral BID BM   fluconazole  200 mg Oral Daily   gabapentin  100 mg Oral TID   guaiFENesin  1,200 mg Oral BID   heparin  5,000 Units Subcutaneous Q8H   ipratropium  0.5 mg Nebulization TID   levalbuterol  0.63 mg Nebulization TID   neomycin-polymyxin b-dexamethasone   Right Eye 6 X Daily   pantoprazole  40 mg Oral Daily   sodium bicarbonate  650 mg Oral BID   traZODone  50 mg Oral QHS   valACYclovir  500 mg Oral BID   Continuous Infusions:   ceFAZolin (ANCEF) IV Stopped (09/11/22 1005)    Data Reviewed:  Basic Metabolic Panel: Recent Labs  Lab 09/07/22 0432 09/08/22 0434 09/09/22 0414 09/10/22 0301 09/11/22 0322  NA 135 139 139 136 137  K 4.0 4.9 4.9 4.1 4.0  CL 104 104 108 110 110  CO2 20* 24 22 19* 20*  GLUCOSE 76 92 83 82 91  BUN 14 18 24* 22 15  CREATININE 1.03* 1.17* 1.71* 1.27* 0.94  CALCIUM 8.3* 9.0 7.8* 7.2* 7.4*  MG 1.7 2.3 2.2 2.1 2.0  PHOS  --  4.4 4.8* 2.5 1.9*    CBC: Recent Labs  Lab 09/05/22 0355 09/06/22 0401 09/07/22 0432 09/08/22 0434 09/09/22 0414 09/10/22 0301 09/11/22 0322  WBC 13.3*   < > 11.3* 10.9* 11.6* 11.6* 10.7*  NEUTROABS 9.2*  --   --  6.9 7.9* 8.3* 7.8*  HGB 10.1*   < > 10.3* 11.0* 9.6* 8.6* 8.8*  HCT 30.7*   < > 33.0* 33.8* 31.1* 27.2* 26.5*  MCV 100.0   < > 107.1* 102.1* 106.1* 102.3* 98.9  PLT 156   < > 222 297 301 331 402*   < > = values in this interval not displayed.    Studies: DG CHEST PORT 1 VIEW  Result Date: 09/10/2022 CLINICAL DATA:  Short of breath. History of metastatic lung cancer and esophageal cancer. Hypertension. EXAM: PORTABLE CHEST 1 VIEW COMPARISON:  09/09/2022. FINDINGS: Cardiac silhouette is normal in size. No mediastinal or hilar masses. Stable right anterior chest wall Port-A-Cath. Mild lung base opacities, left greater than right, consistent with atelectasis, without change from the previous day's study. Remainder of the lungs is  clear. No convincing pleural effusion and no pneumothorax. Skeletal structures are grossly intact. IMPRESSION: 1. No acute findings and no change from the previous day's exam. Electronically Signed   By: Lajean Manes M.D.   On: 09/10/2022 11:29   US RENAL  Result Date: 09/09/2022 CLINICAL DATA:  Acute kidney injury EXAM: RENAL / URINARY TRACT ULTRASOUND COMPLETE COMPARISON:  CT 05/12/2022 FINDINGS: Right Kidney: Renal measurements: 3.8 x 4.5 by 11.3 cm = volume: 101 mL. Echogenicity within normal limits. No mass or hydronephrosis visualized. Left Kidney: Renal measurements: 10.4 x 5.2 x 4 cm = volume: 112 mL. Echogenicity within normal limits. No  mass or hydronephrosis visualized. Bladder: Appears normal for degree of bladder distention. Other: None. IMPRESSION: Normal ultrasound appearance of the kidneys and bladder. No hydronephrosis. Electronically Signed   By: Lucienne Capers M.D.   On: 09/09/2022 20:28    Principal Problem:   Acute cystitis without hematuria Active Problems:   Fall at home, initial encounter   Herpes zoster ophthalmicus of right eye   Atrial fibrillation ([place])   Metastatic lung cancer (metastasis from lung to other site), unspecified laterality ([place])   Malignant neoplasm of distal third of esophagus ([place])   Centrilobular emphysema ([place])   History of pulmonary embolism - pt self discontinued Xarelto without discussing it with her healthcare providers   History of deep vein thrombosis (DVT) of lower extremity   DNR (do not resuscitate)/DNI(Do Not Intubate)   Stage 3b chronic kidney disease (CKD) ([place]) - baseline SCr 1.2-1.5   UTI (urinary tract infection)     Cindy Byrd Aijalon Demuro, [facility]  If 7PM-7AM, please contact night-coverage [email or web address]   LOS: 6 days

## 2022-09-11 NOTE — TOC Progression Note (Signed)
Transition of Care [facility]) - Progression Note    Patient Details  Name: Cindy Byrd MRN: [id number] Date of Birth: [birth year]  [facility]) CM/SW Contact  Ross Ludwig,  Phone Number: 09/11/2022, 3:01 PM  Clinical Narrative:     Patient is currently open to [facility] once patient is medically ready for discharge.   Expected Discharge Plan: [place] Barriers to Discharge: Continued Medical Work up  Expected Discharge Plan and Services Expected Discharge Plan: [place] In-house Referral: NA Discharge Planning Services: CM Consult Post Acute Care Choice: [place] arrangements for the past 2 months: Single Family Home                 DME Arranged: N/A DME Agency: NA       HH Arranged: PT           Social Determinants of Health (SDOH) Interventions    Readmission Risk Interventions     No data to display

## 2022-09-11 NOTE — Progress Notes (Signed)
Physical Therapy Treatment Patient Details Name: Cindy Byrd MRN: [id number] DOB: 21-Sep-1941 Today's Date: 09/11/2022   History of Present Illness 81 yo female admitted with acute cystitis, weakness, repeated falls at home.Hx of shingles, met lung ca, PE, esophageal Ca, Afib, DVT, herpes zoster opthalmicus.    PT Comments    Pt in bathroom upon therapist arrival. She reports she is experiencing diarrhea on today. She was agreeable to ambulating in hallway. LOB x 1 when standing from toilet. Seated rest break needed/taken after leaving bathroom. Afterwards, pt walked ~25 feet with a RW. Dyspnea 3/4 with minimal activity still. Broached the subject of ST rehab again with patient's friend and with RN in room. Pt continues to adamantly refuse placement. Will need to maximize home health care if possible     Recommendations for follow up therapy are one component of a multi-disciplinary discharge planning process, led by the attending physician.  Recommendations may be updated based on patient status, additional functional criteria and insurance authorization.  Follow Up Recommendations  Home health PT (pt continues to refuse ST SNF rehab) Can patient physically be transported by private vehicle: Yes   Assistance Recommended at Discharge Frequent or constant Supervision/Assistance  Patient can return home with the following A little help with walking and/or transfers;A little help with bathing/dressing/bathroom;Assistance with cooking/housework;Assist for transportation;Help with stairs or ramp for entrance   Equipment Recommendations       Recommendations for Other Services OT consult     Precautions / Restrictions Precautions Precautions: Fall Precaution Comments: repeated falls at home prior to this admission Restrictions Weight Bearing Restrictions: No     Mobility  Bed Mobility Overal bed mobility: Needs Assistance Bed Mobility: Sit to Supine       Sit to supine:  Supervision, HOB elevated        Transfers Overall transfer level: Needs assistance Equipment used: Rolling walker (2 wheels) Transfers: Sit to/from Stand Sit to Stand: Min assist           General transfer comment: verbal and tactile cues for correct hand placement. LOB x 1 after rising from toilet-assist required to prevent fall    Ambulation/Gait Ambulation/Gait assistance: Min assist Gait Distance (Feet): 25 Feet (7'x1; 25'x1) Assistive device: Rolling walker (2 wheels) Gait Pattern/deviations: Step-through pattern, Decreased stride length, Trunk flexed       General Gait Details: Cues for safety, RW position. Dyspnea 3/4 with ambulation. Seated reat break needed after walking out of bathroom and before walking in hallway   Stairs             Wheelchair Mobility    Modified Rankin (Stroke Patients Only)       Balance Overall balance assessment: Needs assistance, History of Falls         Standing balance support: Reliant on assistive device for balance, Bilateral upper extremity supported, During functional activity                                Cognition Arousal/Alertness: Awake/alert Behavior During Therapy: WFL for tasks assessed/performed Overall Cognitive Status: Within Functional Limits for tasks assessed                                          Exercises      General Comments        Pertinent  Vitals/Pain Pain Assessment Pain Assessment: Faces Faces Pain Scale: Hurts even more Pain Location: R eye 2* shingles Pain Descriptors / Indicators: Discomfort Pain Intervention(s): Limited activity within patient's tolerance, Monitored during session, Repositioned    Home Living                          Prior Function            PT Goals (current goals can now be found in the care plan section) Progress towards PT goals: Progressing toward goals    Frequency    Min 3X/week      PT  Plan Current plan remains appropriate    Co-evaluation              AM-PAC PT "6 Clicks" Mobility   Outcome Measure  Help needed turning from your back to your side while in a flat bed without using bedrails?: A Little Help needed moving from lying on your back to sitting on the side of a flat bed without using bedrails?: A Little Help needed moving to and from a bed to a chair (including a wheelchair)?: A Little Help needed standing up from a chair using your arms (e.g., wheelchair or bedside chair)?: A Little Help needed to walk in hospital room?: A Little Help needed climbing 3-5 steps with a railing? : A Lot 6 Click Score: 17    End of Session Equipment Utilized During Treatment: Gait belt Activity Tolerance: Patient limited by fatigue Patient left: in bed;with call bell/phone within reach;with bed alarm set;with family/visitor present   PT Visit Diagnosis: Muscle weakness (generalized) (M62.81);Difficulty in walking, not elsewhere classified (R26.2);History of falling (Z91.81);Repeated falls (R29.6)     Time: 1425-1440 PT Time Calculation (min) (ACUTE ONLY): 15 min  Charges:  $Gait Training: 8-22 mins                        Doreatha Massed, PT Acute Rehabilitation  Office: [phone] Pager: [phone]

## 2022-09-12 ENCOUNTER — Other Ambulatory Visit: Payer: Self-pay | Admitting: Hematology

## 2022-09-12 DIAGNOSIS — N390 Urinary tract infection, site not specified: Secondary | ICD-10-CM | POA: Diagnosis not present

## 2022-09-12 DIAGNOSIS — J432 Centrilobular emphysema: Secondary | ICD-10-CM | POA: Diagnosis not present

## 2022-09-12 DIAGNOSIS — I4891 Unspecified atrial fibrillation: Secondary | ICD-10-CM | POA: Diagnosis not present

## 2022-09-12 DIAGNOSIS — N3000 Acute cystitis without hematuria: Secondary | ICD-10-CM | POA: Diagnosis not present

## 2022-09-12 MED ORDER — GUAIFENESIN ER 600 MG PO TB12: 600.0000 mg | ORAL_TABLET | Freq: Two times a day (BID) | ORAL | 0 refills | Status: AC

## 2022-09-12 MED ORDER — NEOMYCIN-POLYMYXIN-DEXAMETH 3.5-10000-0.1 OP OINT: 1.0000 | TOPICAL_OINTMENT | Freq: Every day | OPHTHALMIC | 0 refills | Status: AC

## 2022-09-12 MED ORDER — TRELEGY ELLIPTA 200-62.5-25 MCG/ACT IN AEPB: 1.0000 | INHALATION_SPRAY | Freq: Every day | RESPIRATORY_TRACT | 3 refills | Status: DC

## 2022-09-12 MED ORDER — ACETAMINOPHEN 325 MG PO TABS: 650.0000 mg | ORAL_TABLET | Freq: Four times a day (QID) | ORAL | 0 refills | Status: DC | PRN

## 2022-09-12 MED ORDER — VALACYCLOVIR HCL 500 MG PO TABS: 500.0000 mg | ORAL_TABLET | Freq: Two times a day (BID) | ORAL | 0 refills | Status: DC

## 2022-09-12 MED ORDER — GABAPENTIN 100 MG PO CAPS: 100.0000 mg | ORAL_CAPSULE | Freq: Three times a day (TID) | ORAL | 0 refills | Status: DC

## 2022-09-12 MED ORDER — K PHOS MONO-SOD PHOS DI & MONO 155-852-130 MG PO TABS
500.0000 mg | ORAL_TABLET | Freq: Once | ORAL | Status: DC
  Filled 2022-09-12: qty 2

## 2022-09-12 MED ORDER — ENSURE ENLIVE PO LIQD: 237.0000 mL | Freq: Two times a day (BID) | ORAL | 12 refills | Status: DC

## 2022-09-12 MED ORDER — AZITHROMYCIN 500 MG PO TABS: 500.0000 mg | ORAL_TABLET | Freq: Every day | ORAL | 0 refills | Status: AC

## 2022-09-12 MED ORDER — K PHOS MONO-SOD PHOS DI & MONO 155-852-130 MG PO TABS
500.0000 mg | ORAL_TABLET | Freq: Two times a day (BID) | ORAL | Status: DC
  Administered 2022-09-12: 500 mg via ORAL

## 2022-09-12 NOTE — Progress Notes (Signed)
Mobility Specialist - Progress Note  Physical Therapist requested Mobility Specialist to perform oxygen saturation test with pt which includes removing pt from oxygen both at rest and while ambulating.  Below are the results from that testing.     Patient Saturations on Room Air at Rest = spO2 95%  Patient Saturations on Room Air while Ambulating = sp02 89% .  Rested and performed pursed lip breathing for 1 minute with sp02 at 91%.  At end of testing pt left in room on 91% on RA.  Reported results to nurse.     09/12/22 1047  Mobility  Activity Ambulated with assistance in room  Activity Response Tolerated well  Distance Ambulated (ft) 20 ft  $Mobility charge 1 Mobility  Level of Assistance Contact guard assist, steadying assist  Assistive Device Front wheel walker  Range of Motion/Exercises Active   Ferd Hibbs Mobility Specialist

## 2022-09-12 NOTE — Discharge Summary (Signed)
Physician Discharge Summary   Patient: Cindy Byrd MRN: [id number] DOB: 23-Sep-1941  Admit date:     09/04/2022  Discharge date: 09/12/22  Discharge Physician: Raiford Noble, DO   PCP: [name], PA-C   Recommendations at discharge:   Follow-up with PCP within 1 to 2 weeks and repeat CBC, CMP, mag, Phos within 1 week Follow-up with ophthalmology within 1 week and continue current medication regimen Follow-up with pulmonary in outpatient setting and repeat chest x-ray in 3 to 6 weeks  Discharge Diagnoses: Principal Problem:   Acute cystitis without hematuria Active Problems:   Fall at home, initial encounter   Herpes zoster ophthalmicus of right eye   Atrial fibrillation ([place])   Metastatic lung cancer (metastasis from lung to other site), unspecified laterality ([place])   Malignant neoplasm of distal third of esophagus ([place])   Centrilobular emphysema ([place])   History of pulmonary embolism - pt self discontinued Xarelto without discussing it with her healthcare providers   History of deep vein thrombosis (DVT) of lower extremity   DNR (do not resuscitate)/DNI(Do Not Intubate)   Stage 3b chronic kidney disease (CKD) ([place]) - baseline SCr 1.2-1.5   UTI (urinary tract infection)  Resolved Problems:   * No resolved hospital problems. [place] [facility] Course: Cindy Byrd is a 81 y.o. female with past medical history significant for metastatic lung cancer to bone, esophageal cancer, essential hypertension, paroxysmal atrial fibrillation, history of DVT/PE who self discontinued Xarelto due to cost presented to [facility] ED on 10/1 via ambulance complaining of multiple falls, neck pain, and generalized weakness.  Patient was recently diagnosed with herpes zoster ophthalmicus and is being followed by ophthalmology at [facility].  Patient lives alone but has a friend staying with her and utilizes a walker at baseline.  Denies fever/chills.   In the ED, temperature 98.3 F, HR 87, RR 18, BP  111/66, SPO2 96% on room air.  WBC 15.7, hemoglobin 11.3, platelets 166.  Sodium 137, potassium 3.8, chloride 109, CO2 22, BUN 28, creatinine 1.22, glucose 150.  AST 41, ALT 25, total bilirubin 0.7.  Lipase 26.  Influenza A/B PCR negative.  COVID-19 PCR negative.  Urinalysis with small leukocytes, positive nitrite, many bacteria, 11-20 WBCs.  CT head/C-spine without contrast with no evidence of acute intracranial abnormality, atrophy with small vessel ischemic changes, no evidence of traumatic injury to the cervical spine with mild multilevel degenerative changes.  Chest x-ray with no active cardiopulmonary disease process.  EDP consulted [facility] for admission for further evaluation and treatment of generalized weakness, recurrent falls and urinary tract infection.   Patient's renal function worsened in the setting of hypotension and she now has a slight AKI and will continue IV fluid hydration and give her another bolus.  Overnight she received a 500 mill bolus for hypotension.  Oxycodone has been changed to p.o. tramadol.  PT OT evaluated and still recommending SNF but she is refusing and wants to go home with home health.    Patient was to go home today but started having this productive cough and when ambulatory home O2 screen was done she became a little bit more tachypneic and started to cough quite a bit.  She was placed back on oxygen and nebs were ordered and her fluid has been stopped.  We will watch her respiratory status carefully prior to discharging and may watch her another day and if she is stable will discharge her tomorrow    Assessment and Plan: Klebsiella pneumonia UTI,  improving Urinalysis with small leukocytes, positive nitrite, many bacteria, 11-20 WBCs.  WBC count elevated on admission 15.7. --WBC 15.7>13.3>11.5 and now WBC is 11.3 the day before yesterday and yesterday is 10.9 and now WBC is 11.6 again --Urine culture: >100K Klebsiella pneumonia, susceptibilities  pending --Ceftriaxone 1 g IV every 24 hours and will de-esclate to IV Cefazolin and continue with pharmacy dosing and will change to p.o. cefdinir at discharge -Continue to monitor CBCs daily   Herpes zoster ophthalmicus of right eye -Follows with [place] ophthalmology, Dr. Gershon Crane.   -Continued erythromycin ophthalmologic ointment right eye every 6 hours however after discussion with ophthalmology they recommended Maxitrol ointment 6 times daily, Valtrex 500 mg p.o. twice daily.   -Outpatient follow-up with ophthalmology and I spoke with Dr. Alanda Slim and he recommends her following up within 1 week. -We will start gabapentin 100 mg 3 times daily -Patient was complaining about significant pain so we will stop her oxycodone and change her to tramadol given her hypotension   AKI on CKD stage IIIb Metabolic Acidosis, mild Baseline creatinine 1.2-1.5, review/creatinine was stable but then suddenly worsened likely in the setting of hypotension and BUN/creatinine went from 18/0.98 and trended all the way up to 24/1.71 is now trending back down today at 22/1.27 -Patient's CO2 is now 19, AG is 7, and Chloride Level is now 110 -She was on IV fluid hydration maintenance and received a few boluses the last few days but given her productive coughing and mild dyspnea we have stopped the fluids -Patient was hypotensive throughout the day yesterday and overnight likely contributing to her AKI -We will obtain a repeat urinalysis and this showed an amber color with negative glucose, negative hemoglobin, negative nitrites, negative leukocytes, rare bacteria, 0-5 RBCs per high-power field, 0-5 squamous epithelial cells and 0-5 WBCs -Renal ultrasound done and showed "Normal ultrasound appearance of the kidneys and bladder. No hydronephrosis." -Avoid nephrotoxic medications, contrast dyes, hypotension and dehydration to ensure adequate renal perfusion and will need to renally dose  medications -Repeat CMP in a.m. and if necessary and worsening will need to call nephrology for further evaluation renal function is improving   Hx pulmonary embolism/DVT -Patient previously on Xarelto, but has self discontinued this medication stating that it was too expensive for her to afford and she does not wish to restart anticoagulation. -Last CT angiogram chest in the EMR on 02/25/2019 with no pulmonary embolism evident.  Vascular duplex ultrasound 09/03/2016 with acute DVT.   -Recommend outpatient follow-up with her PCP/specialist.   Paroxysmal atrial fibrillation -Previously on Xarelto, now self discontinued as above. -We will need outpatient follow-up with PCP   Emphysema and associated cough -Respiratory status has been relatively stable and she had no evidence of wheezing but she did have some crackles today and has been coughing productive sputum -Continue monitor respiratory status carefully but she is now on oxygen -SpO2: 100 % O2 Flow Rate (L/min): 1 L/min -CXR done and showed "Bronchitic changes with bibasilar atelectasis. Aortic Atherosclerosis " -I have added Xopenex and Atrovent as well and stopped her IV fluid hydration; may benefit from a a small amount of Lasix -Continue monitor respiratory status carefully and wean O2 as tolerated and will need an ambulatory home O2 screen prior to discharge -Repeat chest x-ray today showed "No acute findings and no change from the previous day's exam." -Patient has a very productive cough and will need to continue monitor and will add azithromycin just in case she has a  bronchitis -We will add flutter valve, guaifenesin 1200 mg p.o. twice daily as well as incentive spirometry   Macrocytic Anemia Patient's hemoglobin/hematocrit has been relatively stable for the last few days but slight drop likely in the setting of dilution and is now 8.6/27.2 with a MCV of 102.3 -Check anemia panel in a.m. -Continue to monitor for signs and  symptoms bleeding; no overt bleeding noted -Repeat CBC in a.m.   Hypoalbuminemia -Patient's albumin level has gone from 2.4 -> 2.0 -> 2.2 -Continue monitor and trend and repeat CMP in a.m.    Depression/Anxiety: -Continue with Wellbutrin 150 mg p.o. daily and Lexapro 20 mg p.o. daily   GERD -C/w PPI Pantoprazole 40 mg p.o. daily   Metastatic non-small cell lung cancer with metastasis to left ilium Adenocarcinoma of the esophagus -Follows with medical oncology outpatient, Dr. Irene Limbo.   -Currently on active surveillance, Xgeva q12 weeks for bone metastasis and Sandostatin q4 weeks for diarrhea.   -Outpatient follow-up with oncology.   Weakness/debility/multiple falls: --PT recommending home health initially but now recommending SNF --OT consult: Recommending home health -Follow-up with [facility] for discharge disposition and SNF was recommended but she does not want to go to SNF so we will be going home with home health     {Tip this will not be part of the note when signed Body mass index is 23.3 kg/m. , ,  Active Pressure Injury/Wound(s)     Pressure Ulcer  Duration          Pressure Injury 09/01/16 Stage I -  Intact skin with non-blanchable redness of a localized area usually over a bony prominence. 2202 days   Pressure Injury 09/01/16 Stage II -  Partial thickness loss of dermis presenting as a shallow open ulcer with a red, pink wound bed without slough. 2202 days           (Optional):26781}  {(NOTE) Pain control PDMP Statment (Optional):26782} Consultants: *** Procedures performed: ***  Disposition: {Plan; Disposition:26390} Diet recommendation:  Discharge Diet Orders (From admission, onward)     Start     Ordered   09/12/22 0000  Diet general        09/12/22 1141           {Diet_Plan:26776} DISCHARGE MEDICATION: Allergies as of 09/12/2022       Reactions   Penicillins Other (See Comments)   Unknown; childhood allergy - details unknown Tolerates Ancef    Remeron [mirtazapine] Other (See Comments)   nightmares   Latex Rash        Medication List     STOP taking these medications    butalbital-acetaminophen-caffeine 50-325-40 MG tablet Commonly known as: FIORICET   doxycycline 100 MG tablet Commonly known as: VIBRA-TABS   HYDROcodone-acetaminophen 5-325 MG tablet Commonly known as: Norco   lidocaine-prilocaine cream Commonly known as: EMLA   metoCLOPramide 10 MG tablet Commonly known as: REGLAN   nystatin 100000 UNIT/ML suspension Commonly known as: MYCOSTATIN   pregabalin 75 MG capsule Commonly known as: Lyrica   valACYclovir 500 MG tablet Commonly known as: VALTREX   Xarelto 20 MG Tabs tablet Generic drug: rivaroxaban       TAKE these medications    acetaminophen 325 MG tablet Commonly known as: TYLENOL Take 2 tablets (650 mg total) by mouth every 6 (six) hours as needed for mild pain (or Fever >/= 101).   azithromycin 500 MG tablet Commonly known as: ZITHROMAX Take 1 tablet (500 mg total) by mouth daily for 3 doses.  B-12 2500 MCG Tabs Take 2,500 mcg by mouth daily.   Biotin 5 MG Caps Take 5 mg by mouth daily.   buPROPion 150 MG 24 hr tablet Commonly known as: Wellbutrin XL Take 1 tablet (150 mg total) by mouth daily.   CALCIUM + D PO Take 1 tablet by mouth daily.   dronabinol 2.5 MG capsule Commonly known as: MARINOL Take 1 capsule (2.5 mg total) by mouth 2 (two) times daily before a meal.   escitalopram 20 MG tablet Commonly known as: LEXAPRO TAKE 1 TABLET BY MOUTH EVERY DAY   feeding supplement Liqd Take 237 mLs by mouth 2 (two) times daily between meals.   fluconazole 200 MG tablet Commonly known as: DIFLUCAN Take 200 mg by mouth daily.   gabapentin 100 MG capsule Commonly known as: NEURONTIN Take 1 capsule (100 mg total) by mouth 3 (three) times daily.   guaiFENesin 600 MG 12 hr tablet Commonly known as: MUCINEX Take 1 tablet (600 mg total) by mouth 2 (two) times daily for 5  days.   neomycin-polymyxin b-dexamethasone 3.5-10000-0.1 Oint Commonly known as: MAXITROL Place 1 Application into the right eye 6 (six) times daily.   omeprazole 40 MG capsule Commonly known as: PRILOSEC TAKE 1 CAPSULE BY MOUTH EVERY DAY BEFORE BREAKFAST   ondansetron 4 MG disintegrating tablet Commonly known as: ZOFRAN-ODT Take 1 tablet (4 mg total) by mouth every 8 (eight) hours as needed for nausea or vomiting.   traZODone 50 MG tablet Commonly known as: DESYREL TAKE 1 TABLET BY MOUTH EVERYDAY AT BEDTIME What changed: See the new instructions.   Trelegy Ellipta 200-62.5-25 MCG/ACT Aepb Generic drug: Fluticasone-Umeclidin-Vilant Inhale 1 puff into the lungs daily.        Follow-up [place] Follow up.   Why: A representative with [facility] will contact you within 24-48 hours from discharge for home health physical therapy services. Contact information: [place] [address] [phone]                Discharge Exam: Filed Weights   09/09/22 0408 09/11/22 0605 09/12/22 0555  Weight: 66.9 kg 68.6 kg 69.5 kg   ***  Condition at discharge: {DC Condition:26389}  The results of significant diagnostics from this hospitalization (including imaging, microbiology, ancillary and laboratory) are listed below for reference.   Imaging Studies: DG CHEST PORT 1 VIEW  Result Date: 09/10/2022 CLINICAL DATA:  Short of breath. History of metastatic lung cancer and esophageal cancer. Hypertension. EXAM: PORTABLE CHEST 1 VIEW COMPARISON:  09/09/2022. FINDINGS: Cardiac silhouette is normal in size. No mediastinal or hilar masses. Stable right anterior chest wall Port-A-Cath. Mild lung base opacities, left greater than right, consistent with atelectasis, without change from the previous day's study. Remainder of the lungs is clear. No convincing pleural effusion and no pneumothorax. Skeletal structures are grossly  intact. IMPRESSION: 1. No acute findings and no change from the previous day's exam. Electronically Signed   By: Lajean Manes M.D.   On: 09/10/2022 11:29   US RENAL  Result Date: 09/09/2022 CLINICAL DATA:  Acute kidney injury EXAM: RENAL / URINARY TRACT ULTRASOUND COMPLETE COMPARISON:  CT 05/12/2022 FINDINGS: Right Kidney: Renal measurements: 3.8 x 4.5 by 11.3 cm = volume: 101 mL. Echogenicity within normal limits. No mass or hydronephrosis visualized. Left Kidney: Renal measurements: 10.4 x 5.2 x 4 cm = volume: 112 mL. Echogenicity within normal limits. No mass or hydronephrosis visualized. Bladder: Appears normal for  degree of bladder distention. Other: None. IMPRESSION: Normal ultrasound appearance of the kidneys and bladder. No hydronephrosis. Electronically Signed   By: Lucienne Capers M.D.   On: 09/09/2022 20:28   DG CHEST PORT 1 VIEW  Result Date: 09/09/2022 CLINICAL DATA:  Shortness of breath, metastatic lung cancer to bone, esophageal cancer, hypertension, atrial fibrillation EXAM: PORTABLE CHEST 1 VIEW COMPARISON:  Portable exam 1352 hours compared to 09/08/2022 FINDINGS: RIGHT jugular Port-A-Cath with tip projecting over SVC. Normal heart size, mediastinal contours, and pulmonary vascularity. Atherosclerotic calcification aorta. Bibasilar atelectasis and mild central bronchitic changes. No pulmonary infiltrate, pleural effusion, or pneumothorax. Sclerotic focus at anterior RIGHT fifth rib corresponding to sclerotic focus on prior CT. IMPRESSION: Bronchitic changes with bibasilar atelectasis. Aortic Atherosclerosis (ICD10-I70.0). Electronically Signed   By: Lavonia Dana M.D.   On: 09/09/2022 14:21   DG Chest Port 1 View  Result Date: 09/08/2022 CLINICAL DATA:  Fevers EXAM: PORTABLE CHEST 1 VIEW COMPARISON:  09/04/2022 FINDINGS: Right chest wall port is noted and stable. Cardiac shadow is stable. Aortic calcifications are seen. Lungs are well aerated bilaterally. Minimal platelike atelectasis  is noted in the left base. No acute bony abnormality is seen. Old rib fractures are again noted. IMPRESSION: Mild left basilar atelectasis. Electronically Signed   By: Inez Catalina M.D.   On: 09/08/2022 21:59   CT HEAD WO CONTRAST (5MM)  Result Date: 09/04/2022 CLINICAL DATA:  Trauma EXAM: CT HEAD WITHOUT CONTRAST CT CERVICAL SPINE WITHOUT CONTRAST TECHNIQUE: Multidetector CT imaging of the head and cervical spine was performed following the standard protocol without intravenous contrast. Multiplanar CT image reconstructions of the cervical spine were also generated. RADIATION DOSE REDUCTION: This exam was performed according to the departmental dose-optimization program which includes automated exposure control, adjustment of the mA and/or kV according to patient size and/or use of iterative reconstruction technique. COMPARISON:  CT head dated 06/09/2022. FINDINGS: CT HEAD FINDINGS Brain: No evidence of acute infarction, hemorrhage, hydrocephalus, extra-axial collection or mass lesion/mass effect. Global cortical atrophy. Subcortical white matter and periventricular small vessel ischemic changes. Vascular: Mild intracranial atherosclerosis. Skull: Normal. Negative for fracture or focal lesion. Sinuses/Orbits: The visualized paranasal sinuses are essentially clear. The mastoid air cells are unopacified. Other: None. CT CERVICAL SPINE FINDINGS Alignment: Straightening of the cervical spine, likely positional. Skull base and vertebrae: No acute fracture. No primary bone lesion or focal pathologic process. Soft tissues and spinal canal: No prevertebral fluid or swelling. No visible canal hematoma. Disc levels: Mild multilevel degenerative changes. Spinal canal is patent. Upper chest: Visualized lung apices are clear. Other: Visualized thyroid is unremarkable. IMPRESSION: No evidence of acute intracranial abnormality. Atrophy with small vessel ischemic changes. No evidence of traumatic injury to the cervical spine.  Mild multilevel degenerative changes. Electronically Signed   By: Julian Hy M.D.   On: 09/04/2022 20:50   CT CERVICAL SPINE WO CONTRAST  Result Date: 09/04/2022 CLINICAL DATA:  Trauma EXAM: CT HEAD WITHOUT CONTRAST CT CERVICAL SPINE WITHOUT CONTRAST TECHNIQUE: Multidetector CT imaging of the head and cervical spine was performed following the standard protocol without intravenous contrast. Multiplanar CT image reconstructions of the cervical spine were also generated. RADIATION DOSE REDUCTION: This exam was performed according to the departmental dose-optimization program which includes automated exposure control, adjustment of the mA and/or kV according to patient size and/or use of iterative reconstruction technique. COMPARISON:  CT head dated 06/09/2022. FINDINGS: CT HEAD FINDINGS Brain: No evidence of acute infarction, hemorrhage, hydrocephalus, extra-axial collection or mass lesion/mass effect. Global  cortical atrophy. Subcortical white matter and periventricular small vessel ischemic changes. Vascular: Mild intracranial atherosclerosis. Skull: Normal. Negative for fracture or focal lesion. Sinuses/Orbits: The visualized paranasal sinuses are essentially clear. The mastoid air cells are unopacified. Other: None. CT CERVICAL SPINE FINDINGS Alignment: Straightening of the cervical spine, likely positional. Skull base and vertebrae: No acute fracture. No primary bone lesion or focal pathologic process. Soft tissues and spinal canal: No prevertebral fluid or swelling. No visible canal hematoma. Disc levels: Mild multilevel degenerative changes. Spinal canal is patent. Upper chest: Visualized lung apices are clear. Other: Visualized thyroid is unremarkable. IMPRESSION: No evidence of acute intracranial abnormality. Atrophy with small vessel ischemic changes. No evidence of traumatic injury to the cervical spine. Mild multilevel degenerative changes. Electronically Signed   By: Julian Hy M.D.    On: 09/04/2022 20:50   DG Chest Portable 1 View  Result Date: 09/04/2022 CLINICAL DATA:  Shortness of breath, multiple falls at home over the past few days. EXAM: PORTABLE CHEST 1 VIEW COMPARISON:  05/12/2022. FINDINGS: The heart size and mediastinal contours are within normal limits. There is atherosclerotic calcification of the aorta. A stable right chest port is noted. Mild atelectasis is noted in the left lung. No effusion or pneumothorax. No acute osseous abnormality. IMPRESSION: No active disease. Electronically Signed   By: Brett Fairy M.D.   On: 09/04/2022 20:04    Microbiology: Results for orders placed or performed during the hospital encounter of 09/04/22  Resp Panel by RT-PCR (Flu A&B, Covid) Anterior Nasal Swab     Status: None   Collection Time: 09/04/22  8:45 PM   Specimen: Anterior Nasal Swab  Result Value Ref Range Status   SARS Coronavirus 2 by RT PCR NEGATIVE NEGATIVE Final    Comment: (NOTE) SARS-CoV-2 target nucleic acids are NOT DETECTED.  The SARS-CoV-2 RNA is generally detectable in upper respiratory specimens during the acute phase of infection. The lowest concentration of SARS-CoV-2 viral copies this assay can detect is 138 copies/mL. A negative result does not preclude SARS-Cov-2 infection and should not be used as the sole basis for treatment or other patient management decisions. A negative result may occur with  improper specimen collection/handling, submission of specimen other than nasopharyngeal swab, presence of viral mutation(s) within the areas targeted by this assay, and inadequate number of viral copies(<138 copies/mL). A negative result must be combined with clinical observations, patient history, and epidemiological information. The expected result is Negative.  Fact Sheet for Patients:  EntrepreneurPulse.com.au  Fact Sheet for Healthcare Providers:  IncredibleEmployment.be  This test is no t yet approved  or cleared by the [place] FDA and  has been authorized for detection and/or diagnosis of SARS-CoV-2 by FDA under an Emergency Use Authorization (EUA). This EUA will remain  in effect (meaning this test can be used) for the duration of the COVID-19 declaration under Section 564(b)(1) of the Act, 21 U.S.C.section 360bbb-3(b)(1), unless the authorization is terminated  or revoked sooner.       Influenza A by PCR NEGATIVE NEGATIVE Final   Influenza B by PCR NEGATIVE NEGATIVE Final    Comment: (NOTE) The Xpert Xpress SARS-CoV-2/FLU/RSV plus assay is intended as an aid in the diagnosis of influenza from Nasopharyngeal swab specimens and should not be used as a sole basis for treatment. Nasal washings and aspirates are unacceptable for Xpert Xpress SARS-CoV-2/FLU/RSV testing.  Fact Sheet for Patients: EntrepreneurPulse.com.au  Fact Sheet for Healthcare Providers: IncredibleEmployment.be  This test is not yet approved or cleared  by the [place] and has been authorized for detection and/or diagnosis of SARS-CoV-2 by FDA under an Emergency Use Authorization (EUA). This EUA will remain in effect (meaning this test can be used) for the duration of the COVID-19 declaration under Section 564(b)(1) of the Act, 21 U.S.C. section 360bbb-3(b)(1), unless the authorization is terminated or revoked.  Performed at [facility], [place] [address], [name], [place] [address]   Urine Culture     Status: Abnormal   Collection Time: 09/04/22 10:06 PM   Specimen: In/Out Cath Urine  Result Value Ref Range Status   Specimen Description   Final    IN/OUT CATH URINE Performed at [facility] [address] [place], [place] [address]    Special Requests   Final    NONE Performed at [facility], [place] [address] [place], [place] [address]    Culture >=100,000 COLONIES/mL KLEBSIELLA PNEUMONIAE (A)   Final   Report Status 09/07/2022 FINAL  Final   Organism ID, Bacteria KLEBSIELLA PNEUMONIAE (A)  Final      Susceptibility   Klebsiella pneumoniae - MIC*    AMPICILLIN >=32 RESISTANT Resistant     CEFAZOLIN <=4 SENSITIVE Sensitive     CEFEPIME <=0.12 SENSITIVE Sensitive     CEFTRIAXONE <=0.25 SENSITIVE Sensitive     CIPROFLOXACIN <=0.25 SENSITIVE Sensitive     GENTAMICIN <=1 SENSITIVE Sensitive     IMIPENEM <=0.25 SENSITIVE Sensitive     NITROFURANTOIN 128 RESISTANT Resistant     TRIMETH/SULFA <=20 SENSITIVE Sensitive     AMPICILLIN/SULBACTAM 16 INTERMEDIATE Intermediate     PIP/TAZO 16 SENSITIVE Sensitive     * >=100,000 COLONIES/mL KLEBSIELLA PNEUMONIAE   *Note: Due to a large number of results and/or encounters for the requested time period, some results have not been displayed. A complete set of results can be found in Results Review.    Labs: CBC: Recent Labs  Lab 09/07/22 0432 09/08/22 0434 09/09/22 0414 09/10/22 0301 09/11/22 0322  WBC 11.3* 10.9* 11.6* 11.6* 10.7*  NEUTROABS  --  6.9 7.9* 8.3* 7.8*  HGB 10.3* 11.0* 9.6* 8.6* 8.8*  HCT 33.0* 33.8* 31.1* 27.2* 26.5*  MCV 107.1* 102.1* 106.1* 102.3* 98.9  PLT 222 297 301 331 492*   Basic Metabolic Panel: Recent Labs  Lab 09/07/22 0432 09/08/22 0434 09/09/22 0414 09/10/22 0301 09/11/22 0322  NA 135 139 139 136 137  K 4.0 4.9 4.9 4.1 4.0  CL 104 104 108 110 110  CO2 20* 24 22 19* 20*  GLUCOSE 76 92 83 82 91  BUN 14 18 24* 22 15  CREATININE 1.03* 1.17* 1.71* 1.27* 0.94  CALCIUM 8.3* 9.0 7.8* 7.2* 7.4*  MG 1.7 2.3 2.2 2.1 2.0  PHOS  --  4.4 4.8* 2.5 1.9*   Liver Function Tests: Recent Labs  Lab 09/08/22 0434 09/09/22 0414 09/10/22 0301 09/11/22 0322  AST 19 30 21 23   ALT 14 7 6  <5  ALKPHOS 75 70 73 85  BILITOT 0.7 1.1 0.5 0.7  PROT 6.9 5.9* 5.9* 6.1*  ALBUMIN 2.4* 2.0* 2.2* 2.2*   CBG: No results for input(s): "GLUCAP" in the last 168 hours.  Discharge time spent: {LESS THAN/GREATER  EFEO:71219} 30 minutes.  Signed: Kerney Elbe, DO [facility] 09/12/2022

## 2022-09-13 ENCOUNTER — Telehealth: Payer: Self-pay

## 2022-09-13 NOTE — Patient Outreach (Signed)
  Care Coordination TOC Note Transition Care Management Follow-up Telephone Call Date of discharge and from where: [facility] 09/[facility]22-09/12/22 How have you been since you were released from the hospital? "I am doing great, trying to regain my strength". Any questions or concerns? No  Items Reviewed: Did the pt receive and understand the discharge instructions provided? Yes  Medications obtained and verified? Yes  Other? No  Any new allergies since your discharge? No  Dietary orders reviewed? Yes Do you have support at home? Yes   Home Care and Equipment/Supplies: Were home health services ordered? yes If so, what is the name of the agency? [facility]  Has the agency set up a time to come to the patient's home? no Were any new equipment or medical supplies ordered?  No What is the name of the medical supply agency? N/A Were you able to get the supplies/equipment? no Do you have any questions related to the use of the equipment or supplies? No  Functional Questionnaire: (I = Independent and D = Dependent) ADLs: I  Bathing/Dressing- I  Meal Prep- I  Eating- I  Maintaining continence- I  Transferring/Ambulation- I  Managing Meds- I  Follow up appointments reviewed:  PCP Hospital f/u appt confirmed? No   Specialist Hospital f/u appt confirmed? Yes  Scheduled to see Dr. Irene Limbo on 09/16/22 @ 10:30. Are transportation arrangements needed? No  If their condition worsens, is the pt aware to call PCP or go to the Emergency Dept.? Yes Was the patient provided with contact information for the PCP's office or ED? Yes Was to pt encouraged to call back with questions or concerns? Yes  SDOH assessments and interventions completed:   Yes  Care Coordination Interventions Activated:  Yes   Care Coordination Interventions:  No Care Coordination interventions needed at this time.   Encounter Outcome:  Pt. Visit Completed

## 2022-09-16 ENCOUNTER — Inpatient Hospital Stay: Payer: Medicare Other

## 2022-09-16 ENCOUNTER — Inpatient Hospital Stay: Payer: Medicare Other | Admitting: Hematology

## 2022-09-16 DIAGNOSIS — Z7901 Long term (current) use of anticoagulants: Secondary | ICD-10-CM | POA: Diagnosis not present

## 2022-09-16 DIAGNOSIS — E785 Hyperlipidemia, unspecified: Secondary | ICD-10-CM | POA: Diagnosis not present

## 2022-09-16 DIAGNOSIS — D126 Benign neoplasm of colon, unspecified: Secondary | ICD-10-CM | POA: Diagnosis not present

## 2022-09-16 DIAGNOSIS — C7951 Secondary malignant neoplasm of bone: Secondary | ICD-10-CM | POA: Diagnosis not present

## 2022-09-16 DIAGNOSIS — K2270 Barrett's esophagus without dysplasia: Secondary | ICD-10-CM | POA: Diagnosis not present

## 2022-09-16 DIAGNOSIS — K5790 Diverticulosis of intestine, part unspecified, without perforation or abscess without bleeding: Secondary | ICD-10-CM | POA: Diagnosis not present

## 2022-09-16 DIAGNOSIS — C3490 Malignant neoplasm of unspecified part of unspecified bronchus or lung: Secondary | ICD-10-CM | POA: Diagnosis not present

## 2022-09-16 DIAGNOSIS — K589 Irritable bowel syndrome without diarrhea: Secondary | ICD-10-CM | POA: Diagnosis not present

## 2022-09-16 DIAGNOSIS — K449 Diaphragmatic hernia without obstruction or gangrene: Secondary | ICD-10-CM | POA: Diagnosis not present

## 2022-09-16 DIAGNOSIS — D849 Immunodeficiency, unspecified: Secondary | ICD-10-CM | POA: Diagnosis not present

## 2022-09-16 DIAGNOSIS — E559 Vitamin D deficiency, unspecified: Secondary | ICD-10-CM | POA: Diagnosis not present

## 2022-09-16 DIAGNOSIS — I10 Essential (primary) hypertension: Secondary | ICD-10-CM | POA: Diagnosis not present

## 2022-09-16 DIAGNOSIS — Z7951 Long term (current) use of inhaled steroids: Secondary | ICD-10-CM | POA: Diagnosis not present

## 2022-09-16 DIAGNOSIS — K219 Gastro-esophageal reflux disease without esophagitis: Secondary | ICD-10-CM | POA: Diagnosis not present

## 2022-09-16 DIAGNOSIS — B0229 Other postherpetic nervous system involvement: Secondary | ICD-10-CM | POA: Diagnosis not present

## 2022-09-16 DIAGNOSIS — J439 Emphysema, unspecified: Secondary | ICD-10-CM | POA: Diagnosis not present

## 2022-09-16 DIAGNOSIS — C155 Malignant neoplasm of lower third of esophagus: Secondary | ICD-10-CM | POA: Diagnosis not present

## 2022-09-16 DIAGNOSIS — F32A Depression, unspecified: Secondary | ICD-10-CM | POA: Diagnosis not present

## 2022-09-19 DIAGNOSIS — H02213 Cicatricial lagophthalmos right eye, unspecified eyelid: Secondary | ICD-10-CM | POA: Diagnosis not present

## 2022-09-19 DIAGNOSIS — B0233 Zoster keratitis: Secondary | ICD-10-CM | POA: Diagnosis not present

## 2022-09-20 DIAGNOSIS — H02213 Cicatricial lagophthalmos right eye, unspecified eyelid: Secondary | ICD-10-CM | POA: Diagnosis not present

## 2022-09-21 DIAGNOSIS — F32A Depression, unspecified: Secondary | ICD-10-CM | POA: Diagnosis not present

## 2022-09-21 DIAGNOSIS — I10 Essential (primary) hypertension: Secondary | ICD-10-CM | POA: Diagnosis not present

## 2022-09-21 DIAGNOSIS — E559 Vitamin D deficiency, unspecified: Secondary | ICD-10-CM | POA: Diagnosis not present

## 2022-09-21 DIAGNOSIS — D849 Immunodeficiency, unspecified: Secondary | ICD-10-CM | POA: Diagnosis not present

## 2022-09-21 DIAGNOSIS — B0229 Other postherpetic nervous system involvement: Secondary | ICD-10-CM | POA: Diagnosis not present

## 2022-09-21 DIAGNOSIS — D126 Benign neoplasm of colon, unspecified: Secondary | ICD-10-CM | POA: Diagnosis not present

## 2022-09-21 DIAGNOSIS — K219 Gastro-esophageal reflux disease without esophagitis: Secondary | ICD-10-CM | POA: Diagnosis not present

## 2022-09-21 DIAGNOSIS — Z7951 Long term (current) use of inhaled steroids: Secondary | ICD-10-CM | POA: Diagnosis not present

## 2022-09-21 DIAGNOSIS — K5790 Diverticulosis of intestine, part unspecified, without perforation or abscess without bleeding: Secondary | ICD-10-CM | POA: Diagnosis not present

## 2022-09-21 DIAGNOSIS — C3490 Malignant neoplasm of unspecified part of unspecified bronchus or lung: Secondary | ICD-10-CM | POA: Diagnosis not present

## 2022-09-21 DIAGNOSIS — C7951 Secondary malignant neoplasm of bone: Secondary | ICD-10-CM | POA: Diagnosis not present

## 2022-09-21 DIAGNOSIS — C155 Malignant neoplasm of lower third of esophagus: Secondary | ICD-10-CM | POA: Diagnosis not present

## 2022-09-21 DIAGNOSIS — K589 Irritable bowel syndrome without diarrhea: Secondary | ICD-10-CM | POA: Diagnosis not present

## 2022-09-21 DIAGNOSIS — K449 Diaphragmatic hernia without obstruction or gangrene: Secondary | ICD-10-CM | POA: Diagnosis not present

## 2022-09-21 DIAGNOSIS — Z7901 Long term (current) use of anticoagulants: Secondary | ICD-10-CM | POA: Diagnosis not present

## 2022-09-21 DIAGNOSIS — K2270 Barrett's esophagus without dysplasia: Secondary | ICD-10-CM | POA: Diagnosis not present

## 2022-09-21 DIAGNOSIS — E785 Hyperlipidemia, unspecified: Secondary | ICD-10-CM | POA: Diagnosis not present

## 2022-09-21 DIAGNOSIS — J439 Emphysema, unspecified: Secondary | ICD-10-CM | POA: Diagnosis not present

## 2022-09-22 DIAGNOSIS — K2270 Barrett's esophagus without dysplasia: Secondary | ICD-10-CM | POA: Diagnosis not present

## 2022-09-22 DIAGNOSIS — K5790 Diverticulosis of intestine, part unspecified, without perforation or abscess without bleeding: Secondary | ICD-10-CM | POA: Diagnosis not present

## 2022-09-22 DIAGNOSIS — C7951 Secondary malignant neoplasm of bone: Secondary | ICD-10-CM | POA: Diagnosis not present

## 2022-09-22 DIAGNOSIS — C3490 Malignant neoplasm of unspecified part of unspecified bronchus or lung: Secondary | ICD-10-CM | POA: Diagnosis not present

## 2022-09-22 DIAGNOSIS — D849 Immunodeficiency, unspecified: Secondary | ICD-10-CM | POA: Diagnosis not present

## 2022-09-22 DIAGNOSIS — I10 Essential (primary) hypertension: Secondary | ICD-10-CM | POA: Diagnosis not present

## 2022-09-22 DIAGNOSIS — K589 Irritable bowel syndrome without diarrhea: Secondary | ICD-10-CM | POA: Diagnosis not present

## 2022-09-22 DIAGNOSIS — Z7951 Long term (current) use of inhaled steroids: Secondary | ICD-10-CM | POA: Diagnosis not present

## 2022-09-22 DIAGNOSIS — Z7901 Long term (current) use of anticoagulants: Secondary | ICD-10-CM | POA: Diagnosis not present

## 2022-09-22 DIAGNOSIS — C155 Malignant neoplasm of lower third of esophagus: Secondary | ICD-10-CM | POA: Diagnosis not present

## 2022-09-22 DIAGNOSIS — E785 Hyperlipidemia, unspecified: Secondary | ICD-10-CM | POA: Diagnosis not present

## 2022-09-22 DIAGNOSIS — D126 Benign neoplasm of colon, unspecified: Secondary | ICD-10-CM | POA: Diagnosis not present

## 2022-09-22 DIAGNOSIS — J439 Emphysema, unspecified: Secondary | ICD-10-CM | POA: Diagnosis not present

## 2022-09-22 DIAGNOSIS — K219 Gastro-esophageal reflux disease without esophagitis: Secondary | ICD-10-CM | POA: Diagnosis not present

## 2022-09-22 DIAGNOSIS — F32A Depression, unspecified: Secondary | ICD-10-CM | POA: Diagnosis not present

## 2022-09-22 DIAGNOSIS — B0229 Other postherpetic nervous system involvement: Secondary | ICD-10-CM | POA: Diagnosis not present

## 2022-09-22 DIAGNOSIS — E559 Vitamin D deficiency, unspecified: Secondary | ICD-10-CM | POA: Diagnosis not present

## 2022-09-22 DIAGNOSIS — K449 Diaphragmatic hernia without obstruction or gangrene: Secondary | ICD-10-CM | POA: Diagnosis not present

## 2022-09-23 DIAGNOSIS — C155 Malignant neoplasm of lower third of esophagus: Secondary | ICD-10-CM | POA: Diagnosis not present

## 2022-09-23 DIAGNOSIS — B0229 Other postherpetic nervous system involvement: Secondary | ICD-10-CM | POA: Diagnosis not present

## 2022-09-23 DIAGNOSIS — D126 Benign neoplasm of colon, unspecified: Secondary | ICD-10-CM | POA: Diagnosis not present

## 2022-09-23 DIAGNOSIS — I10 Essential (primary) hypertension: Secondary | ICD-10-CM | POA: Diagnosis not present

## 2022-09-23 DIAGNOSIS — C3490 Malignant neoplasm of unspecified part of unspecified bronchus or lung: Secondary | ICD-10-CM | POA: Diagnosis not present

## 2022-09-23 DIAGNOSIS — E559 Vitamin D deficiency, unspecified: Secondary | ICD-10-CM | POA: Diagnosis not present

## 2022-09-23 DIAGNOSIS — K2270 Barrett's esophagus without dysplasia: Secondary | ICD-10-CM | POA: Diagnosis not present

## 2022-09-23 DIAGNOSIS — F32A Depression, unspecified: Secondary | ICD-10-CM | POA: Diagnosis not present

## 2022-09-23 DIAGNOSIS — D849 Immunodeficiency, unspecified: Secondary | ICD-10-CM | POA: Diagnosis not present

## 2022-09-23 DIAGNOSIS — E785 Hyperlipidemia, unspecified: Secondary | ICD-10-CM | POA: Diagnosis not present

## 2022-09-23 DIAGNOSIS — C7951 Secondary malignant neoplasm of bone: Secondary | ICD-10-CM | POA: Diagnosis not present

## 2022-09-23 DIAGNOSIS — K449 Diaphragmatic hernia without obstruction or gangrene: Secondary | ICD-10-CM | POA: Diagnosis not present

## 2022-09-23 DIAGNOSIS — J439 Emphysema, unspecified: Secondary | ICD-10-CM | POA: Diagnosis not present

## 2022-09-23 DIAGNOSIS — K219 Gastro-esophageal reflux disease without esophagitis: Secondary | ICD-10-CM | POA: Diagnosis not present

## 2022-09-23 DIAGNOSIS — K5790 Diverticulosis of intestine, part unspecified, without perforation or abscess without bleeding: Secondary | ICD-10-CM | POA: Diagnosis not present

## 2022-09-23 DIAGNOSIS — Z7951 Long term (current) use of inhaled steroids: Secondary | ICD-10-CM | POA: Diagnosis not present

## 2022-09-23 DIAGNOSIS — Z7901 Long term (current) use of anticoagulants: Secondary | ICD-10-CM | POA: Diagnosis not present

## 2022-09-23 DIAGNOSIS — K589 Irritable bowel syndrome without diarrhea: Secondary | ICD-10-CM | POA: Diagnosis not present

## 2022-09-27 DIAGNOSIS — C7951 Secondary malignant neoplasm of bone: Secondary | ICD-10-CM | POA: Diagnosis not present

## 2022-09-27 DIAGNOSIS — K5790 Diverticulosis of intestine, part unspecified, without perforation or abscess without bleeding: Secondary | ICD-10-CM | POA: Diagnosis not present

## 2022-09-27 DIAGNOSIS — I129 Hypertensive chronic kidney disease with stage 1 through stage 4 chronic kidney disease, or unspecified chronic kidney disease: Secondary | ICD-10-CM | POA: Diagnosis not present

## 2022-09-27 DIAGNOSIS — B965 Pseudomonas (aeruginosa) (mallei) (pseudomallei) as the cause of diseases classified elsewhere: Secondary | ICD-10-CM | POA: Diagnosis not present

## 2022-09-27 DIAGNOSIS — N1832 Chronic kidney disease, stage 3b: Secondary | ICD-10-CM | POA: Diagnosis not present

## 2022-09-27 DIAGNOSIS — E785 Hyperlipidemia, unspecified: Secondary | ICD-10-CM | POA: Diagnosis not present

## 2022-09-27 DIAGNOSIS — H5461 Unqualified visual loss, right eye, normal vision left eye: Secondary | ICD-10-CM | POA: Diagnosis not present

## 2022-09-27 DIAGNOSIS — E559 Vitamin D deficiency, unspecified: Secondary | ICD-10-CM | POA: Diagnosis not present

## 2022-09-27 DIAGNOSIS — B0229 Other postherpetic nervous system involvement: Secondary | ICD-10-CM | POA: Diagnosis not present

## 2022-09-27 DIAGNOSIS — Z86711 Personal history of pulmonary embolism: Secondary | ICD-10-CM | POA: Diagnosis not present

## 2022-09-27 DIAGNOSIS — Z9181 History of falling: Secondary | ICD-10-CM | POA: Diagnosis not present

## 2022-09-27 DIAGNOSIS — I480 Paroxysmal atrial fibrillation: Secondary | ICD-10-CM | POA: Diagnosis not present

## 2022-09-27 DIAGNOSIS — N3000 Acute cystitis without hematuria: Secondary | ICD-10-CM | POA: Diagnosis not present

## 2022-09-27 DIAGNOSIS — F32A Depression, unspecified: Secondary | ICD-10-CM | POA: Diagnosis not present

## 2022-09-27 DIAGNOSIS — K219 Gastro-esophageal reflux disease without esophagitis: Secondary | ICD-10-CM | POA: Diagnosis not present

## 2022-09-27 DIAGNOSIS — B0230 Zoster ocular disease, unspecified: Secondary | ICD-10-CM | POA: Diagnosis not present

## 2022-09-27 DIAGNOSIS — K589 Irritable bowel syndrome without diarrhea: Secondary | ICD-10-CM | POA: Diagnosis not present

## 2022-09-27 DIAGNOSIS — K2270 Barrett's esophagus without dysplasia: Secondary | ICD-10-CM | POA: Diagnosis not present

## 2022-09-27 DIAGNOSIS — J432 Centrilobular emphysema: Secondary | ICD-10-CM | POA: Diagnosis not present

## 2022-09-27 DIAGNOSIS — C155 Malignant neoplasm of lower third of esophagus: Secondary | ICD-10-CM | POA: Diagnosis not present

## 2022-09-27 DIAGNOSIS — C3490 Malignant neoplasm of unspecified part of unspecified bronchus or lung: Secondary | ICD-10-CM | POA: Diagnosis not present

## 2022-09-27 DIAGNOSIS — D126 Benign neoplasm of colon, unspecified: Secondary | ICD-10-CM | POA: Diagnosis not present

## 2022-09-27 DIAGNOSIS — D849 Immunodeficiency, unspecified: Secondary | ICD-10-CM | POA: Diagnosis not present

## 2022-09-27 DIAGNOSIS — K449 Diaphragmatic hernia without obstruction or gangrene: Secondary | ICD-10-CM | POA: Diagnosis not present

## 2022-09-28 DIAGNOSIS — N3000 Acute cystitis without hematuria: Secondary | ICD-10-CM | POA: Diagnosis not present

## 2022-09-28 DIAGNOSIS — E559 Vitamin D deficiency, unspecified: Secondary | ICD-10-CM | POA: Diagnosis not present

## 2022-09-28 DIAGNOSIS — K5790 Diverticulosis of intestine, part unspecified, without perforation or abscess without bleeding: Secondary | ICD-10-CM | POA: Diagnosis not present

## 2022-09-28 DIAGNOSIS — B0229 Other postherpetic nervous system involvement: Secondary | ICD-10-CM | POA: Diagnosis not present

## 2022-09-28 DIAGNOSIS — H5461 Unqualified visual loss, right eye, normal vision left eye: Secondary | ICD-10-CM | POA: Diagnosis not present

## 2022-09-28 DIAGNOSIS — Z86711 Personal history of pulmonary embolism: Secondary | ICD-10-CM | POA: Diagnosis not present

## 2022-09-28 DIAGNOSIS — C7951 Secondary malignant neoplasm of bone: Secondary | ICD-10-CM | POA: Diagnosis not present

## 2022-09-28 DIAGNOSIS — D849 Immunodeficiency, unspecified: Secondary | ICD-10-CM | POA: Diagnosis not present

## 2022-09-28 DIAGNOSIS — K2270 Barrett's esophagus without dysplasia: Secondary | ICD-10-CM | POA: Diagnosis not present

## 2022-09-28 DIAGNOSIS — K449 Diaphragmatic hernia without obstruction or gangrene: Secondary | ICD-10-CM | POA: Diagnosis not present

## 2022-09-28 DIAGNOSIS — B965 Pseudomonas (aeruginosa) (mallei) (pseudomallei) as the cause of diseases classified elsewhere: Secondary | ICD-10-CM | POA: Diagnosis not present

## 2022-09-28 DIAGNOSIS — J432 Centrilobular emphysema: Secondary | ICD-10-CM | POA: Diagnosis not present

## 2022-09-28 DIAGNOSIS — K589 Irritable bowel syndrome without diarrhea: Secondary | ICD-10-CM | POA: Diagnosis not present

## 2022-09-28 DIAGNOSIS — I480 Paroxysmal atrial fibrillation: Secondary | ICD-10-CM | POA: Diagnosis not present

## 2022-09-28 DIAGNOSIS — B0230 Zoster ocular disease, unspecified: Secondary | ICD-10-CM | POA: Diagnosis not present

## 2022-09-28 DIAGNOSIS — Z9181 History of falling: Secondary | ICD-10-CM | POA: Diagnosis not present

## 2022-09-28 DIAGNOSIS — E785 Hyperlipidemia, unspecified: Secondary | ICD-10-CM | POA: Diagnosis not present

## 2022-09-28 DIAGNOSIS — C155 Malignant neoplasm of lower third of esophagus: Secondary | ICD-10-CM | POA: Diagnosis not present

## 2022-09-28 DIAGNOSIS — K219 Gastro-esophageal reflux disease without esophagitis: Secondary | ICD-10-CM | POA: Diagnosis not present

## 2022-09-28 DIAGNOSIS — C3490 Malignant neoplasm of unspecified part of unspecified bronchus or lung: Secondary | ICD-10-CM | POA: Diagnosis not present

## 2022-09-28 DIAGNOSIS — F32A Depression, unspecified: Secondary | ICD-10-CM | POA: Diagnosis not present

## 2022-09-28 DIAGNOSIS — D126 Benign neoplasm of colon, unspecified: Secondary | ICD-10-CM | POA: Diagnosis not present

## 2022-09-28 DIAGNOSIS — N1832 Chronic kidney disease, stage 3b: Secondary | ICD-10-CM | POA: Diagnosis not present

## 2022-09-28 DIAGNOSIS — I129 Hypertensive chronic kidney disease with stage 1 through stage 4 chronic kidney disease, or unspecified chronic kidney disease: Secondary | ICD-10-CM | POA: Diagnosis not present

## 2022-09-29 ENCOUNTER — Other Ambulatory Visit: Payer: Self-pay

## 2022-09-29 DIAGNOSIS — C155 Malignant neoplasm of lower third of esophagus: Secondary | ICD-10-CM

## 2022-09-29 NOTE — Progress Notes (Signed)
whee

## 2022-09-30 DIAGNOSIS — K449 Diaphragmatic hernia without obstruction or gangrene: Secondary | ICD-10-CM | POA: Diagnosis not present

## 2022-09-30 DIAGNOSIS — I129 Hypertensive chronic kidney disease with stage 1 through stage 4 chronic kidney disease, or unspecified chronic kidney disease: Secondary | ICD-10-CM | POA: Diagnosis not present

## 2022-09-30 DIAGNOSIS — C155 Malignant neoplasm of lower third of esophagus: Secondary | ICD-10-CM | POA: Diagnosis not present

## 2022-09-30 DIAGNOSIS — K5790 Diverticulosis of intestine, part unspecified, without perforation or abscess without bleeding: Secondary | ICD-10-CM | POA: Diagnosis not present

## 2022-09-30 DIAGNOSIS — I480 Paroxysmal atrial fibrillation: Secondary | ICD-10-CM | POA: Diagnosis not present

## 2022-09-30 DIAGNOSIS — J432 Centrilobular emphysema: Secondary | ICD-10-CM | POA: Diagnosis not present

## 2022-09-30 DIAGNOSIS — Z9181 History of falling: Secondary | ICD-10-CM | POA: Diagnosis not present

## 2022-09-30 DIAGNOSIS — D849 Immunodeficiency, unspecified: Secondary | ICD-10-CM | POA: Diagnosis not present

## 2022-09-30 DIAGNOSIS — C7951 Secondary malignant neoplasm of bone: Secondary | ICD-10-CM | POA: Diagnosis not present

## 2022-09-30 DIAGNOSIS — B965 Pseudomonas (aeruginosa) (mallei) (pseudomallei) as the cause of diseases classified elsewhere: Secondary | ICD-10-CM | POA: Diagnosis not present

## 2022-09-30 DIAGNOSIS — K219 Gastro-esophageal reflux disease without esophagitis: Secondary | ICD-10-CM | POA: Diagnosis not present

## 2022-09-30 DIAGNOSIS — E785 Hyperlipidemia, unspecified: Secondary | ICD-10-CM | POA: Diagnosis not present

## 2022-09-30 DIAGNOSIS — B0230 Zoster ocular disease, unspecified: Secondary | ICD-10-CM | POA: Diagnosis not present

## 2022-09-30 DIAGNOSIS — B0229 Other postherpetic nervous system involvement: Secondary | ICD-10-CM | POA: Diagnosis not present

## 2022-09-30 DIAGNOSIS — N1832 Chronic kidney disease, stage 3b: Secondary | ICD-10-CM | POA: Diagnosis not present

## 2022-09-30 DIAGNOSIS — C3490 Malignant neoplasm of unspecified part of unspecified bronchus or lung: Secondary | ICD-10-CM | POA: Diagnosis not present

## 2022-09-30 DIAGNOSIS — E559 Vitamin D deficiency, unspecified: Secondary | ICD-10-CM | POA: Diagnosis not present

## 2022-09-30 DIAGNOSIS — H5461 Unqualified visual loss, right eye, normal vision left eye: Secondary | ICD-10-CM | POA: Diagnosis not present

## 2022-09-30 DIAGNOSIS — Z86711 Personal history of pulmonary embolism: Secondary | ICD-10-CM | POA: Diagnosis not present

## 2022-09-30 DIAGNOSIS — N3000 Acute cystitis without hematuria: Secondary | ICD-10-CM | POA: Diagnosis not present

## 2022-09-30 DIAGNOSIS — F32A Depression, unspecified: Secondary | ICD-10-CM | POA: Diagnosis not present

## 2022-09-30 DIAGNOSIS — D126 Benign neoplasm of colon, unspecified: Secondary | ICD-10-CM | POA: Diagnosis not present

## 2022-09-30 DIAGNOSIS — K589 Irritable bowel syndrome without diarrhea: Secondary | ICD-10-CM | POA: Diagnosis not present

## 2022-09-30 DIAGNOSIS — K2270 Barrett's esophagus without dysplasia: Secondary | ICD-10-CM | POA: Diagnosis not present

## 2022-10-01 ENCOUNTER — Other Ambulatory Visit: Payer: Self-pay | Admitting: Physician Assistant

## 2022-10-01 DIAGNOSIS — F330 Major depressive disorder, recurrent, mild: Secondary | ICD-10-CM

## 2022-10-01 DIAGNOSIS — F411 Generalized anxiety disorder: Secondary | ICD-10-CM

## 2022-10-03 DIAGNOSIS — B0229 Other postherpetic nervous system involvement: Secondary | ICD-10-CM | POA: Diagnosis not present

## 2022-10-03 DIAGNOSIS — M6281 Muscle weakness (generalized): Secondary | ICD-10-CM | POA: Diagnosis not present

## 2022-10-03 DIAGNOSIS — I480 Paroxysmal atrial fibrillation: Secondary | ICD-10-CM | POA: Diagnosis not present

## 2022-10-03 DIAGNOSIS — K5790 Diverticulosis of intestine, part unspecified, without perforation or abscess without bleeding: Secondary | ICD-10-CM | POA: Diagnosis not present

## 2022-10-03 DIAGNOSIS — K589 Irritable bowel syndrome without diarrhea: Secondary | ICD-10-CM | POA: Diagnosis not present

## 2022-10-03 DIAGNOSIS — C7951 Secondary malignant neoplasm of bone: Secondary | ICD-10-CM | POA: Diagnosis not present

## 2022-10-03 DIAGNOSIS — C155 Malignant neoplasm of lower third of esophagus: Secondary | ICD-10-CM | POA: Diagnosis not present

## 2022-10-03 DIAGNOSIS — C3490 Malignant neoplasm of unspecified part of unspecified bronchus or lung: Secondary | ICD-10-CM | POA: Diagnosis not present

## 2022-10-03 DIAGNOSIS — F32A Depression, unspecified: Secondary | ICD-10-CM | POA: Diagnosis not present

## 2022-10-03 DIAGNOSIS — I129 Hypertensive chronic kidney disease with stage 1 through stage 4 chronic kidney disease, or unspecified chronic kidney disease: Secondary | ICD-10-CM | POA: Diagnosis not present

## 2022-10-03 DIAGNOSIS — K2270 Barrett's esophagus without dysplasia: Secondary | ICD-10-CM | POA: Diagnosis not present

## 2022-10-03 DIAGNOSIS — D126 Benign neoplasm of colon, unspecified: Secondary | ICD-10-CM | POA: Diagnosis not present

## 2022-10-03 DIAGNOSIS — N3000 Acute cystitis without hematuria: Secondary | ICD-10-CM | POA: Diagnosis not present

## 2022-10-03 DIAGNOSIS — E785 Hyperlipidemia, unspecified: Secondary | ICD-10-CM | POA: Diagnosis not present

## 2022-10-03 DIAGNOSIS — K219 Gastro-esophageal reflux disease without esophagitis: Secondary | ICD-10-CM | POA: Diagnosis not present

## 2022-10-03 DIAGNOSIS — J432 Centrilobular emphysema: Secondary | ICD-10-CM | POA: Diagnosis not present

## 2022-10-03 DIAGNOSIS — E559 Vitamin D deficiency, unspecified: Secondary | ICD-10-CM | POA: Diagnosis not present

## 2022-10-03 DIAGNOSIS — K449 Diaphragmatic hernia without obstruction or gangrene: Secondary | ICD-10-CM | POA: Diagnosis not present

## 2022-10-03 DIAGNOSIS — B0230 Zoster ocular disease, unspecified: Secondary | ICD-10-CM | POA: Diagnosis not present

## 2022-10-03 DIAGNOSIS — Z86711 Personal history of pulmonary embolism: Secondary | ICD-10-CM | POA: Diagnosis not present

## 2022-10-03 DIAGNOSIS — Z9181 History of falling: Secondary | ICD-10-CM | POA: Diagnosis not present

## 2022-10-03 DIAGNOSIS — D849 Immunodeficiency, unspecified: Secondary | ICD-10-CM | POA: Diagnosis not present

## 2022-10-03 DIAGNOSIS — N1832 Chronic kidney disease, stage 3b: Secondary | ICD-10-CM | POA: Diagnosis not present

## 2022-10-03 DIAGNOSIS — H5461 Unqualified visual loss, right eye, normal vision left eye: Secondary | ICD-10-CM | POA: Diagnosis not present

## 2022-10-03 DIAGNOSIS — B965 Pseudomonas (aeruginosa) (mallei) (pseudomallei) as the cause of diseases classified elsewhere: Secondary | ICD-10-CM | POA: Diagnosis not present

## 2022-10-04 DIAGNOSIS — H02213 Cicatricial lagophthalmos right eye, unspecified eyelid: Secondary | ICD-10-CM | POA: Diagnosis not present

## 2022-10-04 DIAGNOSIS — B0239 Other herpes zoster eye disease: Secondary | ICD-10-CM | POA: Diagnosis not present

## 2022-10-04 DIAGNOSIS — H02533 Eyelid retraction right eye, unspecified eyelid: Secondary | ICD-10-CM | POA: Diagnosis not present

## 2022-10-04 DIAGNOSIS — L905 Scar conditions and fibrosis of skin: Secondary | ICD-10-CM | POA: Diagnosis not present

## 2022-10-06 DIAGNOSIS — C3490 Malignant neoplasm of unspecified part of unspecified bronchus or lung: Secondary | ICD-10-CM | POA: Diagnosis not present

## 2022-10-06 DIAGNOSIS — D849 Immunodeficiency, unspecified: Secondary | ICD-10-CM | POA: Diagnosis not present

## 2022-10-06 DIAGNOSIS — K589 Irritable bowel syndrome without diarrhea: Secondary | ICD-10-CM | POA: Diagnosis not present

## 2022-10-06 DIAGNOSIS — Z9181 History of falling: Secondary | ICD-10-CM | POA: Diagnosis not present

## 2022-10-06 DIAGNOSIS — C155 Malignant neoplasm of lower third of esophagus: Secondary | ICD-10-CM | POA: Diagnosis not present

## 2022-10-06 DIAGNOSIS — B0230 Zoster ocular disease, unspecified: Secondary | ICD-10-CM | POA: Diagnosis not present

## 2022-10-06 DIAGNOSIS — K2270 Barrett's esophagus without dysplasia: Secondary | ICD-10-CM | POA: Diagnosis not present

## 2022-10-06 DIAGNOSIS — D126 Benign neoplasm of colon, unspecified: Secondary | ICD-10-CM | POA: Diagnosis not present

## 2022-10-06 DIAGNOSIS — I129 Hypertensive chronic kidney disease with stage 1 through stage 4 chronic kidney disease, or unspecified chronic kidney disease: Secondary | ICD-10-CM | POA: Diagnosis not present

## 2022-10-06 DIAGNOSIS — K5790 Diverticulosis of intestine, part unspecified, without perforation or abscess without bleeding: Secondary | ICD-10-CM | POA: Diagnosis not present

## 2022-10-06 DIAGNOSIS — E559 Vitamin D deficiency, unspecified: Secondary | ICD-10-CM | POA: Diagnosis not present

## 2022-10-06 DIAGNOSIS — J432 Centrilobular emphysema: Secondary | ICD-10-CM | POA: Diagnosis not present

## 2022-10-06 DIAGNOSIS — I480 Paroxysmal atrial fibrillation: Secondary | ICD-10-CM | POA: Diagnosis not present

## 2022-10-06 DIAGNOSIS — E785 Hyperlipidemia, unspecified: Secondary | ICD-10-CM | POA: Diagnosis not present

## 2022-10-06 DIAGNOSIS — H5461 Unqualified visual loss, right eye, normal vision left eye: Secondary | ICD-10-CM | POA: Diagnosis not present

## 2022-10-06 DIAGNOSIS — B965 Pseudomonas (aeruginosa) (mallei) (pseudomallei) as the cause of diseases classified elsewhere: Secondary | ICD-10-CM | POA: Diagnosis not present

## 2022-10-06 DIAGNOSIS — Z86711 Personal history of pulmonary embolism: Secondary | ICD-10-CM | POA: Diagnosis not present

## 2022-10-06 DIAGNOSIS — N3000 Acute cystitis without hematuria: Secondary | ICD-10-CM | POA: Diagnosis not present

## 2022-10-06 DIAGNOSIS — C7951 Secondary malignant neoplasm of bone: Secondary | ICD-10-CM | POA: Diagnosis not present

## 2022-10-06 DIAGNOSIS — B0229 Other postherpetic nervous system involvement: Secondary | ICD-10-CM | POA: Diagnosis not present

## 2022-10-06 DIAGNOSIS — F32A Depression, unspecified: Secondary | ICD-10-CM | POA: Diagnosis not present

## 2022-10-06 DIAGNOSIS — K449 Diaphragmatic hernia without obstruction or gangrene: Secondary | ICD-10-CM | POA: Diagnosis not present

## 2022-10-06 DIAGNOSIS — N1832 Chronic kidney disease, stage 3b: Secondary | ICD-10-CM | POA: Diagnosis not present

## 2022-10-06 DIAGNOSIS — K219 Gastro-esophageal reflux disease without esophagitis: Secondary | ICD-10-CM | POA: Diagnosis not present

## 2022-10-07 DIAGNOSIS — K2270 Barrett's esophagus without dysplasia: Secondary | ICD-10-CM | POA: Diagnosis not present

## 2022-10-07 DIAGNOSIS — N1832 Chronic kidney disease, stage 3b: Secondary | ICD-10-CM | POA: Diagnosis not present

## 2022-10-07 DIAGNOSIS — E559 Vitamin D deficiency, unspecified: Secondary | ICD-10-CM | POA: Diagnosis not present

## 2022-10-07 DIAGNOSIS — C155 Malignant neoplasm of lower third of esophagus: Secondary | ICD-10-CM | POA: Diagnosis not present

## 2022-10-07 DIAGNOSIS — C7951 Secondary malignant neoplasm of bone: Secondary | ICD-10-CM | POA: Diagnosis not present

## 2022-10-07 DIAGNOSIS — E785 Hyperlipidemia, unspecified: Secondary | ICD-10-CM | POA: Diagnosis not present

## 2022-10-07 DIAGNOSIS — C3490 Malignant neoplasm of unspecified part of unspecified bronchus or lung: Secondary | ICD-10-CM | POA: Diagnosis not present

## 2022-10-07 DIAGNOSIS — H5461 Unqualified visual loss, right eye, normal vision left eye: Secondary | ICD-10-CM | POA: Diagnosis not present

## 2022-10-07 DIAGNOSIS — D849 Immunodeficiency, unspecified: Secondary | ICD-10-CM | POA: Diagnosis not present

## 2022-10-07 DIAGNOSIS — K219 Gastro-esophageal reflux disease without esophagitis: Secondary | ICD-10-CM | POA: Diagnosis not present

## 2022-10-07 DIAGNOSIS — F32A Depression, unspecified: Secondary | ICD-10-CM | POA: Diagnosis not present

## 2022-10-07 DIAGNOSIS — B965 Pseudomonas (aeruginosa) (mallei) (pseudomallei) as the cause of diseases classified elsewhere: Secondary | ICD-10-CM | POA: Diagnosis not present

## 2022-10-07 DIAGNOSIS — K5790 Diverticulosis of intestine, part unspecified, without perforation or abscess without bleeding: Secondary | ICD-10-CM | POA: Diagnosis not present

## 2022-10-07 DIAGNOSIS — Z9181 History of falling: Secondary | ICD-10-CM | POA: Diagnosis not present

## 2022-10-07 DIAGNOSIS — D126 Benign neoplasm of colon, unspecified: Secondary | ICD-10-CM | POA: Diagnosis not present

## 2022-10-07 DIAGNOSIS — K589 Irritable bowel syndrome without diarrhea: Secondary | ICD-10-CM | POA: Diagnosis not present

## 2022-10-07 DIAGNOSIS — K449 Diaphragmatic hernia without obstruction or gangrene: Secondary | ICD-10-CM | POA: Diagnosis not present

## 2022-10-07 DIAGNOSIS — I480 Paroxysmal atrial fibrillation: Secondary | ICD-10-CM | POA: Diagnosis not present

## 2022-10-07 DIAGNOSIS — Z86711 Personal history of pulmonary embolism: Secondary | ICD-10-CM | POA: Diagnosis not present

## 2022-10-07 DIAGNOSIS — N3000 Acute cystitis without hematuria: Secondary | ICD-10-CM | POA: Diagnosis not present

## 2022-10-07 DIAGNOSIS — I129 Hypertensive chronic kidney disease with stage 1 through stage 4 chronic kidney disease, or unspecified chronic kidney disease: Secondary | ICD-10-CM | POA: Diagnosis not present

## 2022-10-07 DIAGNOSIS — B0229 Other postherpetic nervous system involvement: Secondary | ICD-10-CM | POA: Diagnosis not present

## 2022-10-07 DIAGNOSIS — B0230 Zoster ocular disease, unspecified: Secondary | ICD-10-CM | POA: Diagnosis not present

## 2022-10-07 DIAGNOSIS — J432 Centrilobular emphysema: Secondary | ICD-10-CM | POA: Diagnosis not present

## 2022-10-10 DIAGNOSIS — K589 Irritable bowel syndrome without diarrhea: Secondary | ICD-10-CM | POA: Diagnosis not present

## 2022-10-10 DIAGNOSIS — K5790 Diverticulosis of intestine, part unspecified, without perforation or abscess without bleeding: Secondary | ICD-10-CM | POA: Diagnosis not present

## 2022-10-10 DIAGNOSIS — H5461 Unqualified visual loss, right eye, normal vision left eye: Secondary | ICD-10-CM | POA: Diagnosis not present

## 2022-10-10 DIAGNOSIS — I480 Paroxysmal atrial fibrillation: Secondary | ICD-10-CM | POA: Diagnosis not present

## 2022-10-10 DIAGNOSIS — C7951 Secondary malignant neoplasm of bone: Secondary | ICD-10-CM | POA: Diagnosis not present

## 2022-10-10 DIAGNOSIS — K449 Diaphragmatic hernia without obstruction or gangrene: Secondary | ICD-10-CM | POA: Diagnosis not present

## 2022-10-10 DIAGNOSIS — E559 Vitamin D deficiency, unspecified: Secondary | ICD-10-CM | POA: Diagnosis not present

## 2022-10-10 DIAGNOSIS — B965 Pseudomonas (aeruginosa) (mallei) (pseudomallei) as the cause of diseases classified elsewhere: Secondary | ICD-10-CM | POA: Diagnosis not present

## 2022-10-10 DIAGNOSIS — K2270 Barrett's esophagus without dysplasia: Secondary | ICD-10-CM | POA: Diagnosis not present

## 2022-10-10 DIAGNOSIS — K219 Gastro-esophageal reflux disease without esophagitis: Secondary | ICD-10-CM | POA: Diagnosis not present

## 2022-10-10 DIAGNOSIS — I129 Hypertensive chronic kidney disease with stage 1 through stage 4 chronic kidney disease, or unspecified chronic kidney disease: Secondary | ICD-10-CM | POA: Diagnosis not present

## 2022-10-10 DIAGNOSIS — B0230 Zoster ocular disease, unspecified: Secondary | ICD-10-CM | POA: Diagnosis not present

## 2022-10-10 DIAGNOSIS — J432 Centrilobular emphysema: Secondary | ICD-10-CM | POA: Diagnosis not present

## 2022-10-10 DIAGNOSIS — N3000 Acute cystitis without hematuria: Secondary | ICD-10-CM | POA: Diagnosis not present

## 2022-10-10 DIAGNOSIS — C155 Malignant neoplasm of lower third of esophagus: Secondary | ICD-10-CM | POA: Diagnosis not present

## 2022-10-10 DIAGNOSIS — C3490 Malignant neoplasm of unspecified part of unspecified bronchus or lung: Secondary | ICD-10-CM | POA: Diagnosis not present

## 2022-10-10 DIAGNOSIS — D849 Immunodeficiency, unspecified: Secondary | ICD-10-CM | POA: Diagnosis not present

## 2022-10-10 DIAGNOSIS — B0229 Other postherpetic nervous system involvement: Secondary | ICD-10-CM | POA: Diagnosis not present

## 2022-10-10 DIAGNOSIS — F32A Depression, unspecified: Secondary | ICD-10-CM | POA: Diagnosis not present

## 2022-10-10 DIAGNOSIS — Z9181 History of falling: Secondary | ICD-10-CM | POA: Diagnosis not present

## 2022-10-10 DIAGNOSIS — D126 Benign neoplasm of colon, unspecified: Secondary | ICD-10-CM | POA: Diagnosis not present

## 2022-10-10 DIAGNOSIS — E785 Hyperlipidemia, unspecified: Secondary | ICD-10-CM | POA: Diagnosis not present

## 2022-10-10 DIAGNOSIS — N1832 Chronic kidney disease, stage 3b: Secondary | ICD-10-CM | POA: Diagnosis not present

## 2022-10-10 DIAGNOSIS — Z86711 Personal history of pulmonary embolism: Secondary | ICD-10-CM | POA: Diagnosis not present

## 2022-10-13 DIAGNOSIS — H18891 Other specified disorders of cornea, right eye: Secondary | ICD-10-CM | POA: Diagnosis not present

## 2022-10-13 DIAGNOSIS — H02213 Cicatricial lagophthalmos right eye, unspecified eyelid: Secondary | ICD-10-CM | POA: Diagnosis not present

## 2022-10-14 ENCOUNTER — Inpatient Hospital Stay: Payer: Medicare Other

## 2022-10-14 DIAGNOSIS — C155 Malignant neoplasm of lower third of esophagus: Secondary | ICD-10-CM | POA: Diagnosis not present

## 2022-10-14 DIAGNOSIS — C3490 Malignant neoplasm of unspecified part of unspecified bronchus or lung: Secondary | ICD-10-CM | POA: Diagnosis not present

## 2022-10-14 DIAGNOSIS — B0229 Other postherpetic nervous system involvement: Secondary | ICD-10-CM | POA: Diagnosis not present

## 2022-10-14 DIAGNOSIS — K449 Diaphragmatic hernia without obstruction or gangrene: Secondary | ICD-10-CM | POA: Diagnosis not present

## 2022-10-14 DIAGNOSIS — H5461 Unqualified visual loss, right eye, normal vision left eye: Secondary | ICD-10-CM | POA: Diagnosis not present

## 2022-10-14 DIAGNOSIS — Z9181 History of falling: Secondary | ICD-10-CM | POA: Diagnosis not present

## 2022-10-14 DIAGNOSIS — E785 Hyperlipidemia, unspecified: Secondary | ICD-10-CM | POA: Diagnosis not present

## 2022-10-14 DIAGNOSIS — B0230 Zoster ocular disease, unspecified: Secondary | ICD-10-CM | POA: Diagnosis not present

## 2022-10-14 DIAGNOSIS — D849 Immunodeficiency, unspecified: Secondary | ICD-10-CM | POA: Diagnosis not present

## 2022-10-14 DIAGNOSIS — E559 Vitamin D deficiency, unspecified: Secondary | ICD-10-CM | POA: Diagnosis not present

## 2022-10-14 DIAGNOSIS — K5790 Diverticulosis of intestine, part unspecified, without perforation or abscess without bleeding: Secondary | ICD-10-CM | POA: Diagnosis not present

## 2022-10-14 DIAGNOSIS — N3000 Acute cystitis without hematuria: Secondary | ICD-10-CM | POA: Diagnosis not present

## 2022-10-14 DIAGNOSIS — B965 Pseudomonas (aeruginosa) (mallei) (pseudomallei) as the cause of diseases classified elsewhere: Secondary | ICD-10-CM | POA: Diagnosis not present

## 2022-10-14 DIAGNOSIS — K2270 Barrett's esophagus without dysplasia: Secondary | ICD-10-CM | POA: Diagnosis not present

## 2022-10-14 DIAGNOSIS — D126 Benign neoplasm of colon, unspecified: Secondary | ICD-10-CM | POA: Diagnosis not present

## 2022-10-14 DIAGNOSIS — I129 Hypertensive chronic kidney disease with stage 1 through stage 4 chronic kidney disease, or unspecified chronic kidney disease: Secondary | ICD-10-CM | POA: Diagnosis not present

## 2022-10-14 DIAGNOSIS — K589 Irritable bowel syndrome without diarrhea: Secondary | ICD-10-CM | POA: Diagnosis not present

## 2022-10-14 DIAGNOSIS — F32A Depression, unspecified: Secondary | ICD-10-CM | POA: Diagnosis not present

## 2022-10-14 DIAGNOSIS — K219 Gastro-esophageal reflux disease without esophagitis: Secondary | ICD-10-CM | POA: Diagnosis not present

## 2022-10-14 DIAGNOSIS — J432 Centrilobular emphysema: Secondary | ICD-10-CM | POA: Diagnosis not present

## 2022-10-14 DIAGNOSIS — I480 Paroxysmal atrial fibrillation: Secondary | ICD-10-CM | POA: Diagnosis not present

## 2022-10-14 DIAGNOSIS — N1832 Chronic kidney disease, stage 3b: Secondary | ICD-10-CM | POA: Diagnosis not present

## 2022-10-14 DIAGNOSIS — C7951 Secondary malignant neoplasm of bone: Secondary | ICD-10-CM | POA: Diagnosis not present

## 2022-10-14 DIAGNOSIS — Z86711 Personal history of pulmonary embolism: Secondary | ICD-10-CM | POA: Diagnosis not present

## 2022-10-16 ENCOUNTER — Other Ambulatory Visit: Payer: Self-pay | Admitting: Physician Assistant

## 2022-10-16 ENCOUNTER — Other Ambulatory Visit: Payer: Self-pay | Admitting: Hematology

## 2022-10-16 DIAGNOSIS — F411 Generalized anxiety disorder: Secondary | ICD-10-CM

## 2022-10-16 DIAGNOSIS — F330 Major depressive disorder, recurrent, mild: Secondary | ICD-10-CM

## 2022-10-17 NOTE — Telephone Encounter (Signed)
Needs an office visit.

## 2022-10-18 DIAGNOSIS — H1830 Unspecified corneal membrane change: Secondary | ICD-10-CM | POA: Diagnosis not present

## 2022-10-19 ENCOUNTER — Other Ambulatory Visit: Payer: Self-pay

## 2022-10-19 DIAGNOSIS — K5790 Diverticulosis of intestine, part unspecified, without perforation or abscess without bleeding: Secondary | ICD-10-CM | POA: Diagnosis not present

## 2022-10-19 DIAGNOSIS — E559 Vitamin D deficiency, unspecified: Secondary | ICD-10-CM | POA: Diagnosis not present

## 2022-10-19 DIAGNOSIS — C3490 Malignant neoplasm of unspecified part of unspecified bronchus or lung: Secondary | ICD-10-CM | POA: Diagnosis not present

## 2022-10-19 DIAGNOSIS — K449 Diaphragmatic hernia without obstruction or gangrene: Secondary | ICD-10-CM | POA: Diagnosis not present

## 2022-10-19 DIAGNOSIS — D126 Benign neoplasm of colon, unspecified: Secondary | ICD-10-CM | POA: Diagnosis not present

## 2022-10-19 DIAGNOSIS — C155 Malignant neoplasm of lower third of esophagus: Secondary | ICD-10-CM

## 2022-10-19 DIAGNOSIS — B0229 Other postherpetic nervous system involvement: Secondary | ICD-10-CM | POA: Diagnosis not present

## 2022-10-19 DIAGNOSIS — F32A Depression, unspecified: Secondary | ICD-10-CM | POA: Diagnosis not present

## 2022-10-19 DIAGNOSIS — D849 Immunodeficiency, unspecified: Secondary | ICD-10-CM | POA: Diagnosis not present

## 2022-10-19 DIAGNOSIS — E785 Hyperlipidemia, unspecified: Secondary | ICD-10-CM | POA: Diagnosis not present

## 2022-10-19 DIAGNOSIS — B965 Pseudomonas (aeruginosa) (mallei) (pseudomallei) as the cause of diseases classified elsewhere: Secondary | ICD-10-CM | POA: Diagnosis not present

## 2022-10-19 DIAGNOSIS — I129 Hypertensive chronic kidney disease with stage 1 through stage 4 chronic kidney disease, or unspecified chronic kidney disease: Secondary | ICD-10-CM | POA: Diagnosis not present

## 2022-10-19 DIAGNOSIS — N1832 Chronic kidney disease, stage 3b: Secondary | ICD-10-CM | POA: Diagnosis not present

## 2022-10-19 DIAGNOSIS — H5461 Unqualified visual loss, right eye, normal vision left eye: Secondary | ICD-10-CM | POA: Diagnosis not present

## 2022-10-19 DIAGNOSIS — K589 Irritable bowel syndrome without diarrhea: Secondary | ICD-10-CM | POA: Diagnosis not present

## 2022-10-19 DIAGNOSIS — K219 Gastro-esophageal reflux disease without esophagitis: Secondary | ICD-10-CM | POA: Diagnosis not present

## 2022-10-19 DIAGNOSIS — C7951 Secondary malignant neoplasm of bone: Secondary | ICD-10-CM | POA: Diagnosis not present

## 2022-10-19 DIAGNOSIS — J432 Centrilobular emphysema: Secondary | ICD-10-CM | POA: Diagnosis not present

## 2022-10-19 DIAGNOSIS — Z86711 Personal history of pulmonary embolism: Secondary | ICD-10-CM | POA: Diagnosis not present

## 2022-10-19 DIAGNOSIS — N3000 Acute cystitis without hematuria: Secondary | ICD-10-CM | POA: Diagnosis not present

## 2022-10-19 DIAGNOSIS — Z9181 History of falling: Secondary | ICD-10-CM | POA: Diagnosis not present

## 2022-10-19 DIAGNOSIS — I480 Paroxysmal atrial fibrillation: Secondary | ICD-10-CM | POA: Diagnosis not present

## 2022-10-19 DIAGNOSIS — B0230 Zoster ocular disease, unspecified: Secondary | ICD-10-CM | POA: Diagnosis not present

## 2022-10-19 DIAGNOSIS — K2270 Barrett's esophagus without dysplasia: Secondary | ICD-10-CM | POA: Diagnosis not present

## 2022-10-20 DIAGNOSIS — Z01818 Encounter for other preprocedural examination: Secondary | ICD-10-CM | POA: Diagnosis not present

## 2022-10-21 ENCOUNTER — Inpatient Hospital Stay: Payer: Medicare Other | Attending: Hematology

## 2022-10-21 ENCOUNTER — Inpatient Hospital Stay: Payer: Medicare Other

## 2022-10-21 ENCOUNTER — Inpatient Hospital Stay: Payer: Medicare Other | Admitting: Hematology

## 2022-10-21 ENCOUNTER — Other Ambulatory Visit: Payer: Self-pay

## 2022-10-21 VITALS — BP 128/86 | HR 86 | Temp 97.5°F | Resp 17 | Wt 140.7 lb

## 2022-10-21 DIAGNOSIS — J449 Chronic obstructive pulmonary disease, unspecified: Secondary | ICD-10-CM | POA: Insufficient documentation

## 2022-10-21 DIAGNOSIS — Z86718 Personal history of other venous thrombosis and embolism: Secondary | ICD-10-CM | POA: Diagnosis not present

## 2022-10-21 DIAGNOSIS — K521 Toxic gastroenteritis and colitis: Secondary | ICD-10-CM | POA: Insufficient documentation

## 2022-10-21 DIAGNOSIS — C3490 Malignant neoplasm of unspecified part of unspecified bronchus or lung: Secondary | ICD-10-CM

## 2022-10-21 DIAGNOSIS — C155 Malignant neoplasm of lower third of esophagus: Secondary | ICD-10-CM

## 2022-10-21 DIAGNOSIS — C3491 Malignant neoplasm of unspecified part of right bronchus or lung: Secondary | ICD-10-CM

## 2022-10-21 DIAGNOSIS — B0229 Other postherpetic nervous system involvement: Secondary | ICD-10-CM | POA: Insufficient documentation

## 2022-10-21 DIAGNOSIS — Z79899 Other long term (current) drug therapy: Secondary | ICD-10-CM | POA: Insufficient documentation

## 2022-10-21 DIAGNOSIS — C7951 Secondary malignant neoplasm of bone: Secondary | ICD-10-CM | POA: Diagnosis not present

## 2022-10-21 DIAGNOSIS — Z95828 Presence of other vascular implants and grafts: Secondary | ICD-10-CM

## 2022-10-21 DIAGNOSIS — Z86711 Personal history of pulmonary embolism: Secondary | ICD-10-CM | POA: Diagnosis not present

## 2022-10-21 DIAGNOSIS — Z7189 Other specified counseling: Secondary | ICD-10-CM

## 2022-10-21 DIAGNOSIS — R197 Diarrhea, unspecified: Secondary | ICD-10-CM

## 2022-10-21 DIAGNOSIS — B0230 Zoster ocular disease, unspecified: Secondary | ICD-10-CM

## 2022-10-21 DIAGNOSIS — K529 Noninfective gastroenteritis and colitis, unspecified: Secondary | ICD-10-CM

## 2022-10-21 DIAGNOSIS — Z7901 Long term (current) use of anticoagulants: Secondary | ICD-10-CM | POA: Insufficient documentation

## 2022-10-21 LAB — CMP (CANCER CENTER ONLY)
ALT: 16 U/L (ref 0–44)
AST: 24 U/L (ref 15–41)
Albumin: 3.7 g/dL (ref 3.5–5.0)
Alkaline Phosphatase: 71 U/L (ref 38–126)
Anion gap: 6 (ref 5–15)
BUN: 21 mg/dL (ref 8–23)
CO2: 24 mmol/L (ref 22–32)
Calcium: 10.1 mg/dL (ref 8.9–10.3)
Chloride: 109 mmol/L (ref 98–111)
Creatinine: 0.86 mg/dL (ref 0.44–1.00)
GFR, Estimated: >60 mL/min (ref >60)
Glucose, Bld: 163 mg/dL — ABNORMAL HIGH (ref 70–99)
Potassium: 3.7 mmol/L (ref 3.5–5.1)
Sodium: 139 mmol/L (ref 135–145)
Total Bilirubin: 0.4 mg/dL (ref 0.3–1.2)
Total Protein: 7.6 g/dL (ref 6.5–8.1)

## 2022-10-21 LAB — CBC WITH DIFFERENTIAL (CANCER CENTER ONLY)
Abs Immature Granulocytes: 0.05 10*3/uL (ref 0.00–0.07)
Basophils Absolute: 0.1 10*3/uL (ref 0.0–0.1)
Basophils Relative: 1 %
Eosinophils Absolute: 0.2 10*3/uL (ref 0.0–0.5)
Eosinophils Relative: 2 %
HCT: 37.5 % (ref 36.0–46.0)
Hemoglobin: 12.6 g/dL (ref 12.0–15.0)
Immature Granulocytes: 1 %
Lymphocytes Relative: 20 %
Lymphs Abs: 2.2 10*3/uL (ref 0.7–4.0)
MCH: 34 pg (ref 26.0–34.0)
MCHC: 33.6 g/dL (ref 30.0–36.0)
MCV: 101.1 fL — ABNORMAL HIGH (ref 80.0–100.0)
Monocytes Absolute: 0.8 10*3/uL (ref 0.1–1.0)
Monocytes Relative: 7 %
Neutro Abs: 7.8 10*3/uL — ABNORMAL HIGH (ref 1.7–7.7)
Neutrophils Relative %: 69 %
Platelet Count: 349 10*3/uL (ref 150–400)
RBC: 3.71 MIL/uL — ABNORMAL LOW (ref 3.87–5.11)
RDW: 17.1 % — ABNORMAL HIGH (ref 11.5–15.5)
WBC Count: 11.1 10*3/uL — ABNORMAL HIGH (ref 4.0–10.5)
nRBC: 0 % (ref 0.0–0.2)

## 2022-10-21 MED ORDER — PREGABALIN 50 MG PO CAPS: 50.0000 mg | ORAL_CAPSULE | Freq: Two times a day (BID) | ORAL | 2 refills | Status: DC

## 2022-10-21 MED ORDER — OCTREOTIDE ACETATE 30 MG IM KIT
30.0000 mg | PACK | Freq: Once | INTRAMUSCULAR | Status: AC
  Administered 2022-10-21: 30 mg via INTRAMUSCULAR
  Filled 2022-10-21: qty 1

## 2022-10-21 MED ORDER — DENOSUMAB 120 MG/1.7ML ~~LOC~~ SOLN
120.0000 mg | Freq: Once | SUBCUTANEOUS | Status: AC
  Administered 2022-10-21: 120 mg via SUBCUTANEOUS
  Filled 2022-10-21: qty 1.7

## 2022-10-21 MED ORDER — SODIUM CHLORIDE 0.9% FLUSH
10.0000 mL | Freq: Once | INTRAVENOUS | Status: AC
  Administered 2022-10-21: 10 mL

## 2022-10-21 MED ORDER — HYDROCODONE-ACETAMINOPHEN 5-325 MG PO TABS: 1.0000 | ORAL_TABLET | Freq: Four times a day (QID) | ORAL | 0 refills | Status: DC | PRN

## 2022-10-21 MED ORDER — HEPARIN SOD (PORK) LOCK FLUSH 100 UNIT/ML IV SOLN
500.0000 [IU] | Freq: Once | INTRAVENOUS | Status: AC
  Administered 2022-10-21: 500 [IU]

## 2022-10-25 DIAGNOSIS — H02213 Cicatricial lagophthalmos right eye, unspecified eyelid: Secondary | ICD-10-CM | POA: Diagnosis not present

## 2022-10-25 DIAGNOSIS — H18891 Other specified disorders of cornea, right eye: Secondary | ICD-10-CM | POA: Diagnosis not present

## 2022-10-28 ENCOUNTER — Encounter: Payer: Self-pay | Admitting: Hematology

## 2022-10-28 NOTE — Progress Notes (Addendum)
HEMATOLOGY ONCOLOGY CLINIC VISIT NOTE  Date of service:  10/21/2022   Patient Care Team: [name], PA-C as PCP - General (Physician Assistant) [name], MD as Consulting Physician (Hematology and Oncology)  CHIEF COMPLAINTS/PURPOSE OF CONSULTATION:  Follow-up for continued evaluation and management of metastatic lung cancer Follow-up for esophageal cancer  DIAGNOSIS:   #1 Metastatic non-small cell lung cancer with bilateral lung nodules and large metastatic lesion in the left Ilium. 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 Also had c diff colitis - treated   Current Treatment  1) Active surveillance 2) Xgeva 139m [place] q12weeks for bone metastases. 3) Sandostatin q4weeks for diarrhea   Previous Treatment  For metastatic lung cancer 1 Palliative radiation therapy to the large left ilium metastases 2. IV Nivolumab x 20 cycles (discontinued due to likely immune colitis) 3. Xgeva 1271mSC q4weeks for bone metastases.  For Esophageal adenocarcinoma S/p Concurrent carbo/taxol + RT  HISTORY OF PRESENTING ILLNESS: (plz see my previous consultation for details of initial presentation)  INTERVAL HISTORY:  Patient is here for continued valuation and management of pleural metastatic lung cancer and esophageal cancer and her diffuse. Patient notes her facial injury from her severe herpes ulcer infection. Patient notes no new chest pain or shortness of breath.  No hemoptysis.  No new cough.  No new swallowing problems. Patient has not had her Sandostatin for several months and notes that her diarrhea is starting to come back.  She is scheduled to have this done today. Labs done today were discussed in detail with the patient No other acute new focal symptoms   MEDICAL HISTORY:  Past Medical History:  Diagnosis Date   Barrett's esophagus    [facility]one [facility]24/2015   [facility]   metastatic  poorly differentiated carcinoma. tumor left groin surgical removal with radiation tx.   Cataract    BILATERAL   Cigarette smoker two packs a day or less    Currently still smoking 2 PPD - Not interested in quitting at this time.   Colitis 2017   Colon polyps    hyperplastic, tubular adenomas, tubulovillous adenoma   Cough, persistent    hx. lung cancer ? primary-being evaluated, unsure of primary site.   Depression 06/24/2015   [facility]verticulosis    [facility]   Endometriosis    Hysterectomy with BSO at age [age]rs   [place]   intramucosal   Gastritis    GERD (gastroesophageal reflux disease)    H/O: pneumonia    Hiatal hernia    Hyperlipidemia    Hypertension 06/24/2015   likely improved incidental to 40 lbs weight loss from her neoplasm. No Longer taking med for this as of 08-06-15   IBS (irritable bowel syndrome)    Pain    left hip-persistent"tumor of bone"-radiation tx. 10.   Vitamin D deficiency disease    SURGICAL HISTORY: Past Surgical History:  Procedure Laterality Date   ABDOMINAL HYSTERECTOMY     BALLOON DILATION N/A 10/08/2019   Procedure: BALLOON DILATION;  Surgeon: PyJerene BearsMD;  [facility];  Service: Gastroenterology;  Laterality: N/A;   BARTHOLIN GLAND CYST EXCISION  5235o ago   Does not want if it was an infected cyst or tumor. Was soon as delivery   BIOPSY  01/02/2019   Procedure: BIOPSY;  Surgeon: PyJerene BearsMD;  Location: [facility];  Service: Gastroenterology;;  CATARACT EXTRACTION     COLONOSCOPY W/ POLYPECTOMY     multiple times - last done 09/2014 per patient.   ESOPHAGOGASTRODUODENOSCOPY (EGD) WITH PROPOFOL N/A 08/11/2015   Procedure: ESOPHAGOGASTRODUODENOSCOPY (EGD) WITH PROPOFOL;  Surgeon: [name], MD;  [facility];  Service: Gastroenterology;  Laterality: N/A;   ESOPHAGOGASTRODUODENOSCOPY (EGD) WITH PROPOFOL N/A 01/02/2019   Procedure: ESOPHAGOGASTRODUODENOSCOPY (EGD) WITH  PROPOFOL;  Surgeon: [name], MD;  [facility]: [facility];  Service: Gastroenterology;  Laterality: N/A;   ESOPHAGOGASTRODUODENOSCOPY (EGD) WITH PROPOFOL N/A 10/08/2019   Procedure: ESOPHAGOGASTRODUODENOSCOPY (EGD) WITH PROPOFOL;  Surgeon: [name], MD;  Location: [facility];  Service: Gastroenterology;  Laterality: N/A;   FLEXIBLE SIGMOIDOSCOPY N/A 06/24/2017   Procedure: FLEXIBLE SIGMOIDOSCOPY;  Surgeon: Manus Gunning, MD;  Location: [facility];  Service: Gastroenterology;  Laterality: N/A;   GANGLION CYST EXCISION     KNEE ARTHROSCOPY  age about 53 yrs   TONSILLECTOMY     TOTAL ABDOMINAL HYSTERECTOMY W/ BILATERAL SALPINGOOPHORECTOMY  at age 53 yrs   For endometriosis    SOCIAL HISTORY: Social History   Socioeconomic History   Marital status: Widowed    Spouse name: Not on file   Number of children: 2   Years of education: Not on file   Highest education level: Not on file  Occupational History   Not on file  Tobacco Use   Smoking status: Former    Packs/day: 1.00    Years: 60.00    Total pack years: 60.00    Types: Cigarettes    Quit date: 12/05/2014    Years since quitting: 7.6   Smokeless tobacco: Never  Vaping Use   Vaping Use: Never used  Substance and Sexual Activity   Alcohol use: No    Alcohol/week: 0.0 standard drinks of alcohol   Drug use: No   Sexual activity: Not Currently  Other Topics Concern   Not on file  Social History Narrative   Not on file   Social Determinants of Health   Financial Resource Strain: Not on file  Food Insecurity: Not on file  Transportation Needs: No Transportation Needs (01/23/2019)   PRAPARE - Hydrologist (Medical): No    Lack of Transportation (Non-Medical): No  Physical Activity: Not on file  Stress: Not on file  Social Connections: Not on file  Intimate Partner Violence: Not on file    FAMILY HISTORY: Family History  Problem Relation Age of Onset   Colon cancer  Brother    Colon cancer Brother    Stroke Mother    Colon cancer Father    Emphysema Father        smoked   Breast cancer Daughter 90       ER/PR+ stage II    ALLERGIES:  is allergic to penicillins, remeron [mirtazapine], and latex. patient wonders if she has a penicillin allergy but notes that she is uncertain about this.  MEDICATIONS:  Current Outpatient Medications  Medication Sig Dispense Refill   dronabinol (MARINOL) 2.5 MG capsule Take 1 capsule (2.5 mg total) by mouth 2 (two) times daily before a meal. 60 capsule 0   valACYclovir (VALTREX) 500 MG tablet Take 1 tablet (500 mg total) by mouth 2 (two) times daily. 60 tablet 2   Biotin 5 MG CAPS Take 5 mg by mouth daily.     buPROPion (WELLBUTRIN XL) 150 MG 24 hr tablet Take 1 tablet (150 mg total) by mouth daily. 90 tablet 1  butalbital-acetaminophen-caffeine (FIORICET) 50-325-40 MG tablet Take 1 tablet by mouth every 6 (six) hours as needed for headache or migraine. 20 tablet 0   Calcium Citrate-Vitamin D (CALCIUM + D PO) Take 1 tablet by mouth daily.     Cyanocobalamin (B-12) 2500 MCG TABS Take 2,500 mcg by mouth daily.     doxycycline (VIBRA-TABS) 100 MG tablet Take 1 tablet (100 mg total) by mouth 2 (two) times daily. 20 tablet 0   escitalopram (LEXAPRO) 20 MG tablet TAKE 1 TABLET BY MOUTH EVERY DAY 90 tablet 1   Fluticasone-Umeclidin-Vilant (TRELEGY ELLIPTA) 200-62.5-25 MCG/ACT AEPB Inhale 1 puff into the lungs daily. 1 each 3   HYDROcodone-acetaminophen (NORCO) 5-325 MG tablet Take 1 tablet by mouth every 6 (six) hours as needed for moderate pain or severe pain. 30 tablet 0   lidocaine-prilocaine (EMLA) cream Apply 1 Application topically as needed. 30 g 0   metoCLOPramide (REGLAN) 10 MG tablet Take 1 tablet (10 mg total) by mouth every 8 (eight) hours as needed for nausea. 20 tablet 0   nystatin (MYCOSTATIN) 100000 UNIT/ML suspension Take 5 mLs (500,000 Units total) by mouth 4 (four) times daily. 200 mL 0   omeprazole  (PRILOSEC) 40 MG capsule TAKE 1 CAPSULE BY MOUTH EVERY DAY BEFORE BREAKFAST 90 capsule 1   ondansetron (ZOFRAN-ODT) 4 MG disintegrating tablet Take 1 tablet (4 mg total) by mouth every 8 (eight) hours as needed for nausea or vomiting. 20 tablet 0   prednisoLONE acetate (PRED FORTE) 1 % ophthalmic suspension Place 1 drop into the right eye 4 (four) times daily.     pregabalin (LYRICA) 75 MG capsule Take 1 capsule (75 mg total) by mouth 2 (two) times daily. 60 capsule 2   traZODone (DESYREL) 50 MG tablet TAKE 1 TABLET BY MOUTH EVERYDAY AT BEDTIME 90 tablet 2   XARELTO 20 MG TABS tablet TAKE 1 TABLET BY MOUTH DAILY WITH SUPPER 30 tablet 2   No current facility-administered medications for this visit.    PHYSICAL EXAMINATION:  .BP (!) 101/55   Pulse 85   Temp (!) 97.5 F (36.4 C)   Resp 18   Wt 143 lb 3.2 oz (65 kg)   SpO2 99%   BMI 21.77 kg/m  . NAD GENERAL:alert, in no acute distress and comfortable SKIN: no acute rashes, no significant lesions EYES: conjunctiva are pink and non-injected, sclera anicteric OROPHARYNX: MMM, no exudates, no oropharyngeal erythema or ulceration NECK: supple, no JVD LYMPH:  no palpable lymphadenopathy in the cervical, axillary or inguinal regions LUNGS: clear to auscultation b/l with normal respiratory effort HEART: regular rate & rhythm ABDOMEN:  normoactive bowel sounds , non tender, not distended. Extremity: no pedal edema PSYCH: alert & oriented x 3 with fluent speech NEURO: no focal motor/sensory deficits    LABORATORY DATA:  I have reviewed the data as listed  .    Latest Ref Rng & Units 10/21/2022   12:41 PM 09/11/2022    3:22 AM 09/10/2022    3:01 AM  CBC  WBC 4.0 - 10.5 K/uL 11.1  10.7  11.6   Hemoglobin 12.0 - 15.0 g/dL 12.6  8.8  8.6   Hematocrit 36.0 - 46.0 % 37.5  26.5  27.2   Platelets 150 - 400 K/uL 349  402  331    ANC 1.8k .    Latest Ref Rng & Units 10/21/2022   12:41 PM 09/11/2022    3:22 AM 09/10/2022    3:01 AM   CMP  Glucose  70 - 99 mg/dL 163  91  82   BUN 8 - 23 mg/dL _0 Creatinine 0.44 - 1.00 mg/dL 0.86  0.94  1.27   Sodium 135 - 145 mmol/L 139  137  136   Potassium 3.5 - 5.1 mmol/L 3.7  4.0  4.1   Chloride 98 - 111 mmol/L 109  110  110   CO2 22 - 32 mmol/L _1 Calcium 8.9 - 10.3 mg/dL 10.1  7.4  7.2   Total Protein 6.5 - 8.1 g/dL 7.6  6.1  5.9   Total Bilirubin 0.3 - 1.2 mg/dL 0.4  0.7  0.5   Alkaline Phos 38 - 126 U/L 71  85  73   AST 15 - 41 U/L _2 ALT 0 - 44 U/L 16  <5  6         01/02/19 Esophagus Biopsy:    RADIOGRAPHIC STUDIES: .CT Chest Wo Contrast  Result Date: 07/15/2022 CLINICAL DATA:  Non-small cell lung cancer and esophageal adenocarcinoma. Chemotherapy and radiation therapy completed. Restaging. * Tracking Code: BO * EXAM: CT CHEST WITHOUT CONTRAST TECHNIQUE: Multidetector CT imaging of the chest was performed following the standard protocol without IV contrast. RADIATION DOSE REDUCTION: This exam was performed according to the departmental dose-optimization program which includes automated exposure control, adjustment of the mA and/or kV according to patient size and/or use of iterative reconstruction technique. COMPARISON:  CT chest 12/16/2021 and 05/20/2021. Abdominal CT 05/12/2022. FINDINGS: Cardiovascular: Right IJ Port-A-Cath extends to the mid SVC. There is atherosclerosis of the aorta, great vessels and coronary arteries. No acute vascular findings on noncontrast imaging. The heart size is normal. There is no pericardial effusion. Mediastinum/Nodes: There are no enlarged mediastinal, hilar or axillary lymph nodes.Small hiatal hernia with stable mild wall thickening, but no focal mass lesion. The thyroid gland and trachea appear unremarkable. Lungs/Pleura: No pleural effusion or pneumothorax. Stable mild centrilobular emphysema and scattered linear scarring. No suspicious pulmonary nodule or confluent airspace opacity. Upper abdomen: No acute  findings are seen within the visualized upper abdomen. There are stable low-density hepatic lesions which are likely cysts based on stability. Small gallstones versus sludge. Interval decreased size of the spleen which shows increased lobularity compared with previous chest CT. Musculoskeletal/Chest wall: There is no chest wall mass or suspicious osseous finding. Old left-sided rib fractures. IMPRESSION: 1. Stable chest CT without evidence of local recurrence or metastatic disease. 2. Decreased size of the spleen with increased lobularity compared with prior chest CT of 7 months ago, most likely due to interval splenic infarction or sequela of radiation therapy. 3. Coronary and aortic atherosclerosis (ICD10-I70.0). Emphysema (ICD10-J43.9). Electronically Signed   By: Richardean Sale M.D.   On: 07/15/2022 15:48    ASSESSMENT & PLAN:   81 y.o. female with  #1 Metastatic poorly differentiated carcinoma with likely lung primary non-small cell lung cancer.    CT of the head with and without contrast showed no evidence of metastatic disease. EGFR blood test mutation analysis negative. CT chest abdomen pelvis 04/19/2016 shows no evidence of disease progression. Patient tolerated Nivolumab very well but was discontinued when she developed grade 2 Immune colitis. Has been off Nivolumab for >6 months  CT chest abdomen pelvis on 06/24/2016 shows no evidence of new disease or progression of metastatic disease. CT chest abdomen pelvis 09/06/2016 shows 1. Mixed interval response to therapy. 2. There is a new left  ventral chest wall lesion deep to the pectoralis musculature worrisome for metastatic disease. 3. Posterior lower lobe nodular densities are identified which may reflect areas of pulmonary metastasis. 4. Interval decrease in size of destructive lesion involving the left iliac bone.  CT chest abd pelvis 12/08/2016: Cystic mass involving the left ventral chest wall has resolved in the interval. Likely was a  hematoma due to trauma. Interval increase in size of pleural base mass overlying the posterior and inferior left lower lobe. There is also a new left pleural effusion identified.  CT chest 02/01/2017: Residual irregular soft tissue thickening/volume loss and trace left pleural fluid at the base of the left hemithorax, overall improved in appearance from 12/08/2016. No measurable lesion.  CT chest 05/29/2017 shows no residual pleural based mass or significant pleural effusion in the left hemithorax. No evidence of thoracic metastatic disease. No evidence of progressive metastatic disease within the abdomen or pelvis. Mixed lytic and blastic lesion involving the left iliac bone and associated pathologic fracture are unchanged.   CT CAP 09/14/17 shows no new changes. She does have slight displacement of her fractured left iliac bone. Evidence of stable disease.   CT CAP 01/04/2018- No new or progressive metastatic disease. Stable large left iliac bone metastasis with associated chronic pathologic fracture.   CT chest/abd/pelvis done on 04/26/18 revealed Stable exam.  No new or progressive interval findings.  07/19/18 CT C/A/P revealed Stable exam.  No new or progressive interval findings. Large destructive left iliac lesion is similar to prior. Aortic Atherosclerosis and Emphysema.    11/06/18 CT C/A/P revealed Similar appearance of large mixed lytic and sclerotic lesion in the left ilium. No new metastatic lesions are otherwise noted elsewhere in the chest, abdomen or pelvis. 2. Interval development of thickening of the distal third of the esophagus. This is nonspecific, and could be related to underlying reflux esophagitis. However, if there is any clinical concern for Barrett's metaplasia or esophageal neoplasia, further evaluation with nonemergent endoscopy could be considered. 3. Aortic atherosclerosis, in addition to left main coronary artery disease. Assessment for potential risk factor modification,  dietary therapy or pharmacologic therapy may be warranted, if clinically Indicated. 4. Diffuse bronchial wall thickening with mild to moderate centrilobular and paraseptal emphysema; imaging findings suggestive of underlying COPD. 5. Additional incidental findings, as above.  #2  Adenocarcinoma of the Esophagus  Barrett's esophagus 4cms in the distal esophagus with low and high-grade dysplasia  01/02/19 Surgical pathology revealed adenocarcinoma of the esophagus   01/25/19 PET/CT revealed Distal esophageal primary, without hypermetabolic metastatic disease. 2. Chronic left iliac metastasis, as before. 3. Hypermetabolism within and superficial to the right gluteal musculature is most likely related to trauma and/or injection sites. 4. Aortic atherosclerosis, coronary artery atherosclerosis and emphysema.  S/p concurrent Carboplatin and Taxol weekly with RT of 45 Gy in 25 fractions and 5.4 Gy boost, completed between 02/04/19 and 03/27/19  07/03/2019 PET skull base to thigh revealed "1. Interval response to therapy. Significant reduction in FDG uptake associated with distal esophageal mass. SUV max currently 2.61 versus 16.97 previously. 2. 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 Unchanged 3. Aortic Atherosclerosis (ICD10-I70.0) and Emphysema (ICD10-J43.9). Coronary artery calcifications."  07/15/2020 CT C/A/P ([id number]) ([id number]) revealed "1. No evidence of new or progressive metastatic disease in the chest, abdomen or pelvis."  #3 diarrhea-  now resolved was previously. S/p grade 2 likely related to immune colitis from her Nivolumab and also had c diff colitis (s/p vancomycin) and possible underlying  IBD Now better controlled. She was previously on on Lialda, budesonide,probiotics and lomotil but not currently taking any of these. Plan -Continue Sandostatin every 4 weeks   #4 h/o DVT and PE  -continue on Xarelto - no issues with bleeding   #5 history of COPD management  per primary care physician  #6 severe shingles of the forehead with postherpetic neuralgia PLAN:  Has no symptoms suggestive of esophageal cancer or lung cancer progression at this time. Labs done today were reviewed in detail with the patient Will continue monthly Sandostatin and every 2 monthly Xgeva. No indication additional IV iron at this time. Patient is not taking her Xarelto as prescribed for her PE due to financial issues and not being able to afford it.  FOLLOW UP:  Plz schedule monthly sandostatin and Xgeva x 6 RTC with Dr Irene Limbo with labs in 3 months   The total time spent in the appointment was 21 minutes*.  All of the patient's questions were answered with apparent satisfaction. The patient knows to call the clinic with any problems, questions or concerns.   Sullivan Lone MD MS AAHIVMS [facility] Hematology/Oncology Physician [facility]  .*Total Encounter Time as defined by the Centers for Medicare and Medicaid Services includes, in addition to the face-to-face time of a patient visit (documented in the note above) non-face-to-face time: obtaining and reviewing outside history, ordering and reviewing medications, tests or procedures, care coordination (communications with other health care professionals or caregivers) and documentation in the medical record.

## 2022-10-31 DIAGNOSIS — K219 Gastro-esophageal reflux disease without esophagitis: Secondary | ICD-10-CM | POA: Diagnosis not present

## 2022-10-31 DIAGNOSIS — Z86711 Personal history of pulmonary embolism: Secondary | ICD-10-CM | POA: Diagnosis not present

## 2022-10-31 DIAGNOSIS — H5461 Unqualified visual loss, right eye, normal vision left eye: Secondary | ICD-10-CM | POA: Diagnosis not present

## 2022-10-31 DIAGNOSIS — N3000 Acute cystitis without hematuria: Secondary | ICD-10-CM | POA: Diagnosis not present

## 2022-10-31 DIAGNOSIS — I129 Hypertensive chronic kidney disease with stage 1 through stage 4 chronic kidney disease, or unspecified chronic kidney disease: Secondary | ICD-10-CM | POA: Diagnosis not present

## 2022-10-31 DIAGNOSIS — F32A Depression, unspecified: Secondary | ICD-10-CM | POA: Diagnosis not present

## 2022-10-31 DIAGNOSIS — D126 Benign neoplasm of colon, unspecified: Secondary | ICD-10-CM | POA: Diagnosis not present

## 2022-10-31 DIAGNOSIS — K449 Diaphragmatic hernia without obstruction or gangrene: Secondary | ICD-10-CM | POA: Diagnosis not present

## 2022-10-31 DIAGNOSIS — C155 Malignant neoplasm of lower third of esophagus: Secondary | ICD-10-CM | POA: Diagnosis not present

## 2022-10-31 DIAGNOSIS — B965 Pseudomonas (aeruginosa) (mallei) (pseudomallei) as the cause of diseases classified elsewhere: Secondary | ICD-10-CM | POA: Diagnosis not present

## 2022-10-31 DIAGNOSIS — E559 Vitamin D deficiency, unspecified: Secondary | ICD-10-CM | POA: Diagnosis not present

## 2022-10-31 DIAGNOSIS — I480 Paroxysmal atrial fibrillation: Secondary | ICD-10-CM | POA: Diagnosis not present

## 2022-10-31 DIAGNOSIS — K2270 Barrett's esophagus without dysplasia: Secondary | ICD-10-CM | POA: Diagnosis not present

## 2022-10-31 DIAGNOSIS — K589 Irritable bowel syndrome without diarrhea: Secondary | ICD-10-CM | POA: Diagnosis not present

## 2022-10-31 DIAGNOSIS — J432 Centrilobular emphysema: Secondary | ICD-10-CM | POA: Diagnosis not present

## 2022-10-31 DIAGNOSIS — C7951 Secondary malignant neoplasm of bone: Secondary | ICD-10-CM | POA: Diagnosis not present

## 2022-10-31 DIAGNOSIS — E785 Hyperlipidemia, unspecified: Secondary | ICD-10-CM | POA: Diagnosis not present

## 2022-10-31 DIAGNOSIS — K5790 Diverticulosis of intestine, part unspecified, without perforation or abscess without bleeding: Secondary | ICD-10-CM | POA: Diagnosis not present

## 2022-10-31 DIAGNOSIS — C3490 Malignant neoplasm of unspecified part of unspecified bronchus or lung: Secondary | ICD-10-CM | POA: Diagnosis not present

## 2022-10-31 DIAGNOSIS — N1832 Chronic kidney disease, stage 3b: Secondary | ICD-10-CM | POA: Diagnosis not present

## 2022-10-31 DIAGNOSIS — Z9181 History of falling: Secondary | ICD-10-CM | POA: Diagnosis not present

## 2022-10-31 DIAGNOSIS — B0229 Other postherpetic nervous system involvement: Secondary | ICD-10-CM | POA: Diagnosis not present

## 2022-10-31 DIAGNOSIS — B0230 Zoster ocular disease, unspecified: Secondary | ICD-10-CM | POA: Diagnosis not present

## 2022-10-31 DIAGNOSIS — D849 Immunodeficiency, unspecified: Secondary | ICD-10-CM | POA: Diagnosis not present

## 2022-11-01 DIAGNOSIS — H18891 Other specified disorders of cornea, right eye: Secondary | ICD-10-CM | POA: Diagnosis not present

## 2022-11-03 DIAGNOSIS — M6281 Muscle weakness (generalized): Secondary | ICD-10-CM | POA: Diagnosis not present

## 2022-11-04 DIAGNOSIS — B965 Pseudomonas (aeruginosa) (mallei) (pseudomallei) as the cause of diseases classified elsewhere: Secondary | ICD-10-CM | POA: Diagnosis not present

## 2022-11-04 DIAGNOSIS — N1832 Chronic kidney disease, stage 3b: Secondary | ICD-10-CM | POA: Diagnosis not present

## 2022-11-04 DIAGNOSIS — I480 Paroxysmal atrial fibrillation: Secondary | ICD-10-CM | POA: Diagnosis not present

## 2022-11-04 DIAGNOSIS — E559 Vitamin D deficiency, unspecified: Secondary | ICD-10-CM | POA: Diagnosis not present

## 2022-11-04 DIAGNOSIS — C3490 Malignant neoplasm of unspecified part of unspecified bronchus or lung: Secondary | ICD-10-CM | POA: Diagnosis not present

## 2022-11-04 DIAGNOSIS — B0230 Zoster ocular disease, unspecified: Secondary | ICD-10-CM | POA: Diagnosis not present

## 2022-11-04 DIAGNOSIS — K219 Gastro-esophageal reflux disease without esophagitis: Secondary | ICD-10-CM | POA: Diagnosis not present

## 2022-11-04 DIAGNOSIS — I129 Hypertensive chronic kidney disease with stage 1 through stage 4 chronic kidney disease, or unspecified chronic kidney disease: Secondary | ICD-10-CM | POA: Diagnosis not present

## 2022-11-04 DIAGNOSIS — D849 Immunodeficiency, unspecified: Secondary | ICD-10-CM | POA: Diagnosis not present

## 2022-11-04 DIAGNOSIS — C155 Malignant neoplasm of lower third of esophagus: Secondary | ICD-10-CM | POA: Diagnosis not present

## 2022-11-04 DIAGNOSIS — D126 Benign neoplasm of colon, unspecified: Secondary | ICD-10-CM | POA: Diagnosis not present

## 2022-11-04 DIAGNOSIS — B0229 Other postherpetic nervous system involvement: Secondary | ICD-10-CM | POA: Diagnosis not present

## 2022-11-04 DIAGNOSIS — K5790 Diverticulosis of intestine, part unspecified, without perforation or abscess without bleeding: Secondary | ICD-10-CM | POA: Diagnosis not present

## 2022-11-04 DIAGNOSIS — Z9181 History of falling: Secondary | ICD-10-CM | POA: Diagnosis not present

## 2022-11-04 DIAGNOSIS — Z86711 Personal history of pulmonary embolism: Secondary | ICD-10-CM | POA: Diagnosis not present

## 2022-11-04 DIAGNOSIS — C7951 Secondary malignant neoplasm of bone: Secondary | ICD-10-CM | POA: Diagnosis not present

## 2022-11-04 DIAGNOSIS — K2270 Barrett's esophagus without dysplasia: Secondary | ICD-10-CM | POA: Diagnosis not present

## 2022-11-04 DIAGNOSIS — H5461 Unqualified visual loss, right eye, normal vision left eye: Secondary | ICD-10-CM | POA: Diagnosis not present

## 2022-11-04 DIAGNOSIS — J432 Centrilobular emphysema: Secondary | ICD-10-CM | POA: Diagnosis not present

## 2022-11-04 DIAGNOSIS — K449 Diaphragmatic hernia without obstruction or gangrene: Secondary | ICD-10-CM | POA: Diagnosis not present

## 2022-11-04 DIAGNOSIS — E785 Hyperlipidemia, unspecified: Secondary | ICD-10-CM | POA: Diagnosis not present

## 2022-11-04 DIAGNOSIS — N3000 Acute cystitis without hematuria: Secondary | ICD-10-CM | POA: Diagnosis not present

## 2022-11-04 DIAGNOSIS — K589 Irritable bowel syndrome without diarrhea: Secondary | ICD-10-CM | POA: Diagnosis not present

## 2022-11-04 DIAGNOSIS — F32A Depression, unspecified: Secondary | ICD-10-CM | POA: Diagnosis not present

## 2022-11-10 DIAGNOSIS — K5790 Diverticulosis of intestine, part unspecified, without perforation or abscess without bleeding: Secondary | ICD-10-CM | POA: Diagnosis not present

## 2022-11-10 DIAGNOSIS — B0230 Zoster ocular disease, unspecified: Secondary | ICD-10-CM | POA: Diagnosis not present

## 2022-11-10 DIAGNOSIS — K219 Gastro-esophageal reflux disease without esophagitis: Secondary | ICD-10-CM | POA: Diagnosis not present

## 2022-11-10 DIAGNOSIS — Z86711 Personal history of pulmonary embolism: Secondary | ICD-10-CM | POA: Diagnosis not present

## 2022-11-10 DIAGNOSIS — B965 Pseudomonas (aeruginosa) (mallei) (pseudomallei) as the cause of diseases classified elsewhere: Secondary | ICD-10-CM | POA: Diagnosis not present

## 2022-11-10 DIAGNOSIS — N3000 Acute cystitis without hematuria: Secondary | ICD-10-CM | POA: Diagnosis not present

## 2022-11-10 DIAGNOSIS — C3490 Malignant neoplasm of unspecified part of unspecified bronchus or lung: Secondary | ICD-10-CM | POA: Diagnosis not present

## 2022-11-10 DIAGNOSIS — C155 Malignant neoplasm of lower third of esophagus: Secondary | ICD-10-CM | POA: Diagnosis not present

## 2022-11-10 DIAGNOSIS — D126 Benign neoplasm of colon, unspecified: Secondary | ICD-10-CM | POA: Diagnosis not present

## 2022-11-10 DIAGNOSIS — K589 Irritable bowel syndrome without diarrhea: Secondary | ICD-10-CM | POA: Diagnosis not present

## 2022-11-10 DIAGNOSIS — C7951 Secondary malignant neoplasm of bone: Secondary | ICD-10-CM | POA: Diagnosis not present

## 2022-11-10 DIAGNOSIS — N1832 Chronic kidney disease, stage 3b: Secondary | ICD-10-CM | POA: Diagnosis not present

## 2022-11-10 DIAGNOSIS — E559 Vitamin D deficiency, unspecified: Secondary | ICD-10-CM | POA: Diagnosis not present

## 2022-11-10 DIAGNOSIS — H5461 Unqualified visual loss, right eye, normal vision left eye: Secondary | ICD-10-CM | POA: Diagnosis not present

## 2022-11-10 DIAGNOSIS — K449 Diaphragmatic hernia without obstruction or gangrene: Secondary | ICD-10-CM | POA: Diagnosis not present

## 2022-11-10 DIAGNOSIS — E785 Hyperlipidemia, unspecified: Secondary | ICD-10-CM | POA: Diagnosis not present

## 2022-11-10 DIAGNOSIS — J432 Centrilobular emphysema: Secondary | ICD-10-CM | POA: Diagnosis not present

## 2022-11-10 DIAGNOSIS — I480 Paroxysmal atrial fibrillation: Secondary | ICD-10-CM | POA: Diagnosis not present

## 2022-11-10 DIAGNOSIS — K2270 Barrett's esophagus without dysplasia: Secondary | ICD-10-CM | POA: Diagnosis not present

## 2022-11-10 DIAGNOSIS — Z9181 History of falling: Secondary | ICD-10-CM | POA: Diagnosis not present

## 2022-11-10 DIAGNOSIS — B0229 Other postherpetic nervous system involvement: Secondary | ICD-10-CM | POA: Diagnosis not present

## 2022-11-10 DIAGNOSIS — I129 Hypertensive chronic kidney disease with stage 1 through stage 4 chronic kidney disease, or unspecified chronic kidney disease: Secondary | ICD-10-CM | POA: Diagnosis not present

## 2022-11-10 DIAGNOSIS — F32A Depression, unspecified: Secondary | ICD-10-CM | POA: Diagnosis not present

## 2022-11-10 DIAGNOSIS — D849 Immunodeficiency, unspecified: Secondary | ICD-10-CM | POA: Diagnosis not present

## 2022-11-11 DIAGNOSIS — N1832 Chronic kidney disease, stage 3b: Secondary | ICD-10-CM | POA: Diagnosis not present

## 2022-11-11 DIAGNOSIS — I129 Hypertensive chronic kidney disease with stage 1 through stage 4 chronic kidney disease, or unspecified chronic kidney disease: Secondary | ICD-10-CM | POA: Diagnosis not present

## 2022-11-11 DIAGNOSIS — B0230 Zoster ocular disease, unspecified: Secondary | ICD-10-CM | POA: Diagnosis not present

## 2022-11-11 DIAGNOSIS — J432 Centrilobular emphysema: Secondary | ICD-10-CM | POA: Diagnosis not present

## 2022-11-11 DIAGNOSIS — Z86711 Personal history of pulmonary embolism: Secondary | ICD-10-CM | POA: Diagnosis not present

## 2022-11-11 DIAGNOSIS — D849 Immunodeficiency, unspecified: Secondary | ICD-10-CM | POA: Diagnosis not present

## 2022-11-11 DIAGNOSIS — K2270 Barrett's esophagus without dysplasia: Secondary | ICD-10-CM | POA: Diagnosis not present

## 2022-11-11 DIAGNOSIS — K589 Irritable bowel syndrome without diarrhea: Secondary | ICD-10-CM | POA: Diagnosis not present

## 2022-11-11 DIAGNOSIS — C3490 Malignant neoplasm of unspecified part of unspecified bronchus or lung: Secondary | ICD-10-CM | POA: Diagnosis not present

## 2022-11-11 DIAGNOSIS — N3000 Acute cystitis without hematuria: Secondary | ICD-10-CM | POA: Diagnosis not present

## 2022-11-11 DIAGNOSIS — C155 Malignant neoplasm of lower third of esophagus: Secondary | ICD-10-CM | POA: Diagnosis not present

## 2022-11-11 DIAGNOSIS — B965 Pseudomonas (aeruginosa) (mallei) (pseudomallei) as the cause of diseases classified elsewhere: Secondary | ICD-10-CM | POA: Diagnosis not present

## 2022-11-11 DIAGNOSIS — K5790 Diverticulosis of intestine, part unspecified, without perforation or abscess without bleeding: Secondary | ICD-10-CM | POA: Diagnosis not present

## 2022-11-11 DIAGNOSIS — C7951 Secondary malignant neoplasm of bone: Secondary | ICD-10-CM | POA: Diagnosis not present

## 2022-11-11 DIAGNOSIS — K219 Gastro-esophageal reflux disease without esophagitis: Secondary | ICD-10-CM | POA: Diagnosis not present

## 2022-11-11 DIAGNOSIS — F32A Depression, unspecified: Secondary | ICD-10-CM | POA: Diagnosis not present

## 2022-11-11 DIAGNOSIS — B0229 Other postherpetic nervous system involvement: Secondary | ICD-10-CM | POA: Diagnosis not present

## 2022-11-11 DIAGNOSIS — I480 Paroxysmal atrial fibrillation: Secondary | ICD-10-CM | POA: Diagnosis not present

## 2022-11-11 DIAGNOSIS — H5461 Unqualified visual loss, right eye, normal vision left eye: Secondary | ICD-10-CM | POA: Diagnosis not present

## 2022-11-11 DIAGNOSIS — Z9181 History of falling: Secondary | ICD-10-CM | POA: Diagnosis not present

## 2022-11-11 DIAGNOSIS — E785 Hyperlipidemia, unspecified: Secondary | ICD-10-CM | POA: Diagnosis not present

## 2022-11-11 DIAGNOSIS — E559 Vitamin D deficiency, unspecified: Secondary | ICD-10-CM | POA: Diagnosis not present

## 2022-11-11 DIAGNOSIS — K449 Diaphragmatic hernia without obstruction or gangrene: Secondary | ICD-10-CM | POA: Diagnosis not present

## 2022-11-11 DIAGNOSIS — D126 Benign neoplasm of colon, unspecified: Secondary | ICD-10-CM | POA: Diagnosis not present

## 2022-11-16 DIAGNOSIS — H02533 Eyelid retraction right eye, unspecified eyelid: Secondary | ICD-10-CM | POA: Diagnosis not present

## 2022-11-17 ENCOUNTER — Other Ambulatory Visit: Payer: Self-pay | Admitting: Hematology

## 2022-11-17 DIAGNOSIS — C155 Malignant neoplasm of lower third of esophagus: Secondary | ICD-10-CM | POA: Diagnosis not present

## 2022-11-17 DIAGNOSIS — E785 Hyperlipidemia, unspecified: Secondary | ICD-10-CM | POA: Diagnosis not present

## 2022-11-17 DIAGNOSIS — N3000 Acute cystitis without hematuria: Secondary | ICD-10-CM | POA: Diagnosis not present

## 2022-11-17 DIAGNOSIS — K589 Irritable bowel syndrome without diarrhea: Secondary | ICD-10-CM | POA: Diagnosis not present

## 2022-11-17 DIAGNOSIS — C3490 Malignant neoplasm of unspecified part of unspecified bronchus or lung: Secondary | ICD-10-CM | POA: Diagnosis not present

## 2022-11-17 DIAGNOSIS — K2270 Barrett's esophagus without dysplasia: Secondary | ICD-10-CM | POA: Diagnosis not present

## 2022-11-17 DIAGNOSIS — J432 Centrilobular emphysema: Secondary | ICD-10-CM | POA: Diagnosis not present

## 2022-11-17 DIAGNOSIS — E559 Vitamin D deficiency, unspecified: Secondary | ICD-10-CM | POA: Diagnosis not present

## 2022-11-17 DIAGNOSIS — Z9181 History of falling: Secondary | ICD-10-CM | POA: Diagnosis not present

## 2022-11-17 DIAGNOSIS — F32A Depression, unspecified: Secondary | ICD-10-CM | POA: Diagnosis not present

## 2022-11-17 DIAGNOSIS — D849 Immunodeficiency, unspecified: Secondary | ICD-10-CM | POA: Diagnosis not present

## 2022-11-17 DIAGNOSIS — K5790 Diverticulosis of intestine, part unspecified, without perforation or abscess without bleeding: Secondary | ICD-10-CM | POA: Diagnosis not present

## 2022-11-17 DIAGNOSIS — D126 Benign neoplasm of colon, unspecified: Secondary | ICD-10-CM | POA: Diagnosis not present

## 2022-11-17 DIAGNOSIS — Z86711 Personal history of pulmonary embolism: Secondary | ICD-10-CM | POA: Diagnosis not present

## 2022-11-17 DIAGNOSIS — B0230 Zoster ocular disease, unspecified: Secondary | ICD-10-CM | POA: Diagnosis not present

## 2022-11-17 DIAGNOSIS — B0229 Other postherpetic nervous system involvement: Secondary | ICD-10-CM | POA: Diagnosis not present

## 2022-11-17 DIAGNOSIS — N1832 Chronic kidney disease, stage 3b: Secondary | ICD-10-CM | POA: Diagnosis not present

## 2022-11-17 DIAGNOSIS — K449 Diaphragmatic hernia without obstruction or gangrene: Secondary | ICD-10-CM | POA: Diagnosis not present

## 2022-11-17 DIAGNOSIS — I129 Hypertensive chronic kidney disease with stage 1 through stage 4 chronic kidney disease, or unspecified chronic kidney disease: Secondary | ICD-10-CM | POA: Diagnosis not present

## 2022-11-17 DIAGNOSIS — H5461 Unqualified visual loss, right eye, normal vision left eye: Secondary | ICD-10-CM | POA: Diagnosis not present

## 2022-11-17 DIAGNOSIS — B965 Pseudomonas (aeruginosa) (mallei) (pseudomallei) as the cause of diseases classified elsewhere: Secondary | ICD-10-CM | POA: Diagnosis not present

## 2022-11-17 DIAGNOSIS — I480 Paroxysmal atrial fibrillation: Secondary | ICD-10-CM | POA: Diagnosis not present

## 2022-11-17 DIAGNOSIS — K219 Gastro-esophageal reflux disease without esophagitis: Secondary | ICD-10-CM | POA: Diagnosis not present

## 2022-11-17 DIAGNOSIS — C7951 Secondary malignant neoplasm of bone: Secondary | ICD-10-CM | POA: Diagnosis not present

## 2022-11-18 ENCOUNTER — Ambulatory Visit: Payer: Medicare Other

## 2022-11-23 DIAGNOSIS — E785 Hyperlipidemia, unspecified: Secondary | ICD-10-CM | POA: Diagnosis not present

## 2022-11-23 DIAGNOSIS — I480 Paroxysmal atrial fibrillation: Secondary | ICD-10-CM | POA: Diagnosis not present

## 2022-11-23 DIAGNOSIS — C155 Malignant neoplasm of lower third of esophagus: Secondary | ICD-10-CM | POA: Diagnosis not present

## 2022-11-23 DIAGNOSIS — F32A Depression, unspecified: Secondary | ICD-10-CM | POA: Diagnosis not present

## 2022-11-23 DIAGNOSIS — C3490 Malignant neoplasm of unspecified part of unspecified bronchus or lung: Secondary | ICD-10-CM | POA: Diagnosis not present

## 2022-11-23 DIAGNOSIS — B0230 Zoster ocular disease, unspecified: Secondary | ICD-10-CM | POA: Diagnosis not present

## 2022-11-23 DIAGNOSIS — I129 Hypertensive chronic kidney disease with stage 1 through stage 4 chronic kidney disease, or unspecified chronic kidney disease: Secondary | ICD-10-CM | POA: Diagnosis not present

## 2022-11-23 DIAGNOSIS — D849 Immunodeficiency, unspecified: Secondary | ICD-10-CM | POA: Diagnosis not present

## 2022-11-23 DIAGNOSIS — N1832 Chronic kidney disease, stage 3b: Secondary | ICD-10-CM | POA: Diagnosis not present

## 2022-11-23 DIAGNOSIS — J432 Centrilobular emphysema: Secondary | ICD-10-CM | POA: Diagnosis not present

## 2022-11-23 DIAGNOSIS — D126 Benign neoplasm of colon, unspecified: Secondary | ICD-10-CM | POA: Diagnosis not present

## 2022-11-23 DIAGNOSIS — H5461 Unqualified visual loss, right eye, normal vision left eye: Secondary | ICD-10-CM | POA: Diagnosis not present

## 2022-11-23 DIAGNOSIS — K449 Diaphragmatic hernia without obstruction or gangrene: Secondary | ICD-10-CM | POA: Diagnosis not present

## 2022-11-23 DIAGNOSIS — K219 Gastro-esophageal reflux disease without esophagitis: Secondary | ICD-10-CM | POA: Diagnosis not present

## 2022-11-23 DIAGNOSIS — Z86711 Personal history of pulmonary embolism: Secondary | ICD-10-CM | POA: Diagnosis not present

## 2022-11-23 DIAGNOSIS — E559 Vitamin D deficiency, unspecified: Secondary | ICD-10-CM | POA: Diagnosis not present

## 2022-11-23 DIAGNOSIS — K5790 Diverticulosis of intestine, part unspecified, without perforation or abscess without bleeding: Secondary | ICD-10-CM | POA: Diagnosis not present

## 2022-11-23 DIAGNOSIS — B0229 Other postherpetic nervous system involvement: Secondary | ICD-10-CM | POA: Diagnosis not present

## 2022-11-23 DIAGNOSIS — K2270 Barrett's esophagus without dysplasia: Secondary | ICD-10-CM | POA: Diagnosis not present

## 2022-11-23 DIAGNOSIS — N3000 Acute cystitis without hematuria: Secondary | ICD-10-CM | POA: Diagnosis not present

## 2022-11-23 DIAGNOSIS — C7951 Secondary malignant neoplasm of bone: Secondary | ICD-10-CM | POA: Diagnosis not present

## 2022-11-23 DIAGNOSIS — Z9181 History of falling: Secondary | ICD-10-CM | POA: Diagnosis not present

## 2022-11-23 DIAGNOSIS — B965 Pseudomonas (aeruginosa) (mallei) (pseudomallei) as the cause of diseases classified elsewhere: Secondary | ICD-10-CM | POA: Diagnosis not present

## 2022-11-23 DIAGNOSIS — K589 Irritable bowel syndrome without diarrhea: Secondary | ICD-10-CM | POA: Diagnosis not present

## 2022-11-24 DIAGNOSIS — K219 Gastro-esophageal reflux disease without esophagitis: Secondary | ICD-10-CM | POA: Diagnosis not present

## 2022-11-24 DIAGNOSIS — J432 Centrilobular emphysema: Secondary | ICD-10-CM | POA: Diagnosis not present

## 2022-11-24 DIAGNOSIS — N1832 Chronic kidney disease, stage 3b: Secondary | ICD-10-CM | POA: Diagnosis not present

## 2022-11-24 DIAGNOSIS — I129 Hypertensive chronic kidney disease with stage 1 through stage 4 chronic kidney disease, or unspecified chronic kidney disease: Secondary | ICD-10-CM | POA: Diagnosis not present

## 2022-11-24 DIAGNOSIS — C3490 Malignant neoplasm of unspecified part of unspecified bronchus or lung: Secondary | ICD-10-CM | POA: Diagnosis not present

## 2022-11-24 DIAGNOSIS — H5461 Unqualified visual loss, right eye, normal vision left eye: Secondary | ICD-10-CM | POA: Diagnosis not present

## 2022-11-24 DIAGNOSIS — N3000 Acute cystitis without hematuria: Secondary | ICD-10-CM | POA: Diagnosis not present

## 2022-11-24 DIAGNOSIS — E559 Vitamin D deficiency, unspecified: Secondary | ICD-10-CM | POA: Diagnosis not present

## 2022-11-24 DIAGNOSIS — K449 Diaphragmatic hernia without obstruction or gangrene: Secondary | ICD-10-CM | POA: Diagnosis not present

## 2022-11-24 DIAGNOSIS — C155 Malignant neoplasm of lower third of esophagus: Secondary | ICD-10-CM | POA: Diagnosis not present

## 2022-11-24 DIAGNOSIS — B0229 Other postherpetic nervous system involvement: Secondary | ICD-10-CM | POA: Diagnosis not present

## 2022-11-24 DIAGNOSIS — K5790 Diverticulosis of intestine, part unspecified, without perforation or abscess without bleeding: Secondary | ICD-10-CM | POA: Diagnosis not present

## 2022-11-24 DIAGNOSIS — D126 Benign neoplasm of colon, unspecified: Secondary | ICD-10-CM | POA: Diagnosis not present

## 2022-11-24 DIAGNOSIS — E785 Hyperlipidemia, unspecified: Secondary | ICD-10-CM | POA: Diagnosis not present

## 2022-11-24 DIAGNOSIS — K589 Irritable bowel syndrome without diarrhea: Secondary | ICD-10-CM | POA: Diagnosis not present

## 2022-11-24 DIAGNOSIS — B965 Pseudomonas (aeruginosa) (mallei) (pseudomallei) as the cause of diseases classified elsewhere: Secondary | ICD-10-CM | POA: Diagnosis not present

## 2022-11-24 DIAGNOSIS — Z9181 History of falling: Secondary | ICD-10-CM | POA: Diagnosis not present

## 2022-11-24 DIAGNOSIS — K2270 Barrett's esophagus without dysplasia: Secondary | ICD-10-CM | POA: Diagnosis not present

## 2022-11-24 DIAGNOSIS — D849 Immunodeficiency, unspecified: Secondary | ICD-10-CM | POA: Diagnosis not present

## 2022-11-24 DIAGNOSIS — I480 Paroxysmal atrial fibrillation: Secondary | ICD-10-CM | POA: Diagnosis not present

## 2022-11-24 DIAGNOSIS — Z86711 Personal history of pulmonary embolism: Secondary | ICD-10-CM | POA: Diagnosis not present

## 2022-11-24 DIAGNOSIS — B0230 Zoster ocular disease, unspecified: Secondary | ICD-10-CM | POA: Diagnosis not present

## 2022-11-24 DIAGNOSIS — C7951 Secondary malignant neoplasm of bone: Secondary | ICD-10-CM | POA: Diagnosis not present

## 2022-11-24 DIAGNOSIS — F32A Depression, unspecified: Secondary | ICD-10-CM | POA: Diagnosis not present

## 2022-11-29 ENCOUNTER — Ambulatory Visit: Payer: Medicare Other | Admitting: Internal Medicine

## 2022-12-03 DIAGNOSIS — K2270 Barrett's esophagus without dysplasia: Secondary | ICD-10-CM | POA: Diagnosis not present

## 2022-12-03 DIAGNOSIS — D849 Immunodeficiency, unspecified: Secondary | ICD-10-CM | POA: Diagnosis not present

## 2022-12-03 DIAGNOSIS — Z9181 History of falling: Secondary | ICD-10-CM | POA: Diagnosis not present

## 2022-12-03 DIAGNOSIS — B0229 Other postherpetic nervous system involvement: Secondary | ICD-10-CM | POA: Diagnosis not present

## 2022-12-03 DIAGNOSIS — N3000 Acute cystitis without hematuria: Secondary | ICD-10-CM | POA: Diagnosis not present

## 2022-12-03 DIAGNOSIS — I480 Paroxysmal atrial fibrillation: Secondary | ICD-10-CM | POA: Diagnosis not present

## 2022-12-03 DIAGNOSIS — J432 Centrilobular emphysema: Secondary | ICD-10-CM | POA: Diagnosis not present

## 2022-12-03 DIAGNOSIS — E559 Vitamin D deficiency, unspecified: Secondary | ICD-10-CM | POA: Diagnosis not present

## 2022-12-03 DIAGNOSIS — D126 Benign neoplasm of colon, unspecified: Secondary | ICD-10-CM | POA: Diagnosis not present

## 2022-12-03 DIAGNOSIS — K219 Gastro-esophageal reflux disease without esophagitis: Secondary | ICD-10-CM | POA: Diagnosis not present

## 2022-12-03 DIAGNOSIS — N1832 Chronic kidney disease, stage 3b: Secondary | ICD-10-CM | POA: Diagnosis not present

## 2022-12-03 DIAGNOSIS — C3490 Malignant neoplasm of unspecified part of unspecified bronchus or lung: Secondary | ICD-10-CM | POA: Diagnosis not present

## 2022-12-03 DIAGNOSIS — F32A Depression, unspecified: Secondary | ICD-10-CM | POA: Diagnosis not present

## 2022-12-03 DIAGNOSIS — Z86718 Personal history of other venous thrombosis and embolism: Secondary | ICD-10-CM | POA: Diagnosis not present

## 2022-12-03 DIAGNOSIS — K589 Irritable bowel syndrome without diarrhea: Secondary | ICD-10-CM | POA: Diagnosis not present

## 2022-12-03 DIAGNOSIS — C155 Malignant neoplasm of lower third of esophagus: Secondary | ICD-10-CM | POA: Diagnosis not present

## 2022-12-03 DIAGNOSIS — K449 Diaphragmatic hernia without obstruction or gangrene: Secondary | ICD-10-CM | POA: Diagnosis not present

## 2022-12-03 DIAGNOSIS — E785 Hyperlipidemia, unspecified: Secondary | ICD-10-CM | POA: Diagnosis not present

## 2022-12-03 DIAGNOSIS — I129 Hypertensive chronic kidney disease with stage 1 through stage 4 chronic kidney disease, or unspecified chronic kidney disease: Secondary | ICD-10-CM | POA: Diagnosis not present

## 2022-12-03 DIAGNOSIS — Z86711 Personal history of pulmonary embolism: Secondary | ICD-10-CM | POA: Diagnosis not present

## 2022-12-03 DIAGNOSIS — H5461 Unqualified visual loss, right eye, normal vision left eye: Secondary | ICD-10-CM | POA: Diagnosis not present

## 2022-12-03 DIAGNOSIS — K5790 Diverticulosis of intestine, part unspecified, without perforation or abscess without bleeding: Secondary | ICD-10-CM | POA: Diagnosis not present

## 2022-12-03 DIAGNOSIS — M6281 Muscle weakness (generalized): Secondary | ICD-10-CM | POA: Diagnosis not present

## 2022-12-03 DIAGNOSIS — C7951 Secondary malignant neoplasm of bone: Secondary | ICD-10-CM | POA: Diagnosis not present

## 2022-12-03 DIAGNOSIS — B0230 Zoster ocular disease, unspecified: Secondary | ICD-10-CM | POA: Diagnosis not present

## 2022-12-05 HISTORY — PX: EYE SURGERY: SHX253

## 2022-12-09 DIAGNOSIS — N3000 Acute cystitis without hematuria: Secondary | ICD-10-CM | POA: Diagnosis not present

## 2022-12-09 DIAGNOSIS — B0230 Zoster ocular disease, unspecified: Secondary | ICD-10-CM | POA: Diagnosis not present

## 2022-12-09 DIAGNOSIS — D849 Immunodeficiency, unspecified: Secondary | ICD-10-CM | POA: Diagnosis not present

## 2022-12-09 DIAGNOSIS — Z86718 Personal history of other venous thrombosis and embolism: Secondary | ICD-10-CM | POA: Diagnosis not present

## 2022-12-09 DIAGNOSIS — H5461 Unqualified visual loss, right eye, normal vision left eye: Secondary | ICD-10-CM | POA: Diagnosis not present

## 2022-12-09 DIAGNOSIS — C3490 Malignant neoplasm of unspecified part of unspecified bronchus or lung: Secondary | ICD-10-CM | POA: Diagnosis not present

## 2022-12-09 DIAGNOSIS — N1832 Chronic kidney disease, stage 3b: Secondary | ICD-10-CM | POA: Diagnosis not present

## 2022-12-09 DIAGNOSIS — B0229 Other postherpetic nervous system involvement: Secondary | ICD-10-CM | POA: Diagnosis not present

## 2022-12-09 DIAGNOSIS — K5790 Diverticulosis of intestine, part unspecified, without perforation or abscess without bleeding: Secondary | ICD-10-CM | POA: Diagnosis not present

## 2022-12-09 DIAGNOSIS — E785 Hyperlipidemia, unspecified: Secondary | ICD-10-CM | POA: Diagnosis not present

## 2022-12-09 DIAGNOSIS — I129 Hypertensive chronic kidney disease with stage 1 through stage 4 chronic kidney disease, or unspecified chronic kidney disease: Secondary | ICD-10-CM | POA: Diagnosis not present

## 2022-12-09 DIAGNOSIS — F32A Depression, unspecified: Secondary | ICD-10-CM | POA: Diagnosis not present

## 2022-12-09 DIAGNOSIS — J432 Centrilobular emphysema: Secondary | ICD-10-CM | POA: Diagnosis not present

## 2022-12-09 DIAGNOSIS — C155 Malignant neoplasm of lower third of esophagus: Secondary | ICD-10-CM | POA: Diagnosis not present

## 2022-12-09 DIAGNOSIS — Z86711 Personal history of pulmonary embolism: Secondary | ICD-10-CM | POA: Diagnosis not present

## 2022-12-09 DIAGNOSIS — K449 Diaphragmatic hernia without obstruction or gangrene: Secondary | ICD-10-CM | POA: Diagnosis not present

## 2022-12-09 DIAGNOSIS — D126 Benign neoplasm of colon, unspecified: Secondary | ICD-10-CM | POA: Diagnosis not present

## 2022-12-09 DIAGNOSIS — K2270 Barrett's esophagus without dysplasia: Secondary | ICD-10-CM | POA: Diagnosis not present

## 2022-12-09 DIAGNOSIS — Z9181 History of falling: Secondary | ICD-10-CM | POA: Diagnosis not present

## 2022-12-09 DIAGNOSIS — E559 Vitamin D deficiency, unspecified: Secondary | ICD-10-CM | POA: Diagnosis not present

## 2022-12-09 DIAGNOSIS — K219 Gastro-esophageal reflux disease without esophagitis: Secondary | ICD-10-CM | POA: Diagnosis not present

## 2022-12-09 DIAGNOSIS — C7951 Secondary malignant neoplasm of bone: Secondary | ICD-10-CM | POA: Diagnosis not present

## 2022-12-09 DIAGNOSIS — K589 Irritable bowel syndrome without diarrhea: Secondary | ICD-10-CM | POA: Diagnosis not present

## 2022-12-09 DIAGNOSIS — I480 Paroxysmal atrial fibrillation: Secondary | ICD-10-CM | POA: Diagnosis not present

## 2022-12-15 DIAGNOSIS — F32A Depression, unspecified: Secondary | ICD-10-CM | POA: Diagnosis not present

## 2022-12-15 DIAGNOSIS — B0229 Other postherpetic nervous system involvement: Secondary | ICD-10-CM | POA: Diagnosis not present

## 2022-12-15 DIAGNOSIS — E559 Vitamin D deficiency, unspecified: Secondary | ICD-10-CM | POA: Diagnosis not present

## 2022-12-15 DIAGNOSIS — B0230 Zoster ocular disease, unspecified: Secondary | ICD-10-CM | POA: Diagnosis not present

## 2022-12-15 DIAGNOSIS — K2270 Barrett's esophagus without dysplasia: Secondary | ICD-10-CM | POA: Diagnosis not present

## 2022-12-15 DIAGNOSIS — D849 Immunodeficiency, unspecified: Secondary | ICD-10-CM | POA: Diagnosis not present

## 2022-12-15 DIAGNOSIS — J432 Centrilobular emphysema: Secondary | ICD-10-CM | POA: Diagnosis not present

## 2022-12-15 DIAGNOSIS — K449 Diaphragmatic hernia without obstruction or gangrene: Secondary | ICD-10-CM | POA: Diagnosis not present

## 2022-12-15 DIAGNOSIS — K5790 Diverticulosis of intestine, part unspecified, without perforation or abscess without bleeding: Secondary | ICD-10-CM | POA: Diagnosis not present

## 2022-12-15 DIAGNOSIS — Z86718 Personal history of other venous thrombosis and embolism: Secondary | ICD-10-CM | POA: Diagnosis not present

## 2022-12-15 DIAGNOSIS — C7951 Secondary malignant neoplasm of bone: Secondary | ICD-10-CM | POA: Diagnosis not present

## 2022-12-15 DIAGNOSIS — H5461 Unqualified visual loss, right eye, normal vision left eye: Secondary | ICD-10-CM | POA: Diagnosis not present

## 2022-12-15 DIAGNOSIS — I129 Hypertensive chronic kidney disease with stage 1 through stage 4 chronic kidney disease, or unspecified chronic kidney disease: Secondary | ICD-10-CM | POA: Diagnosis not present

## 2022-12-15 DIAGNOSIS — E785 Hyperlipidemia, unspecified: Secondary | ICD-10-CM | POA: Diagnosis not present

## 2022-12-15 DIAGNOSIS — Z9181 History of falling: Secondary | ICD-10-CM | POA: Diagnosis not present

## 2022-12-15 DIAGNOSIS — N3000 Acute cystitis without hematuria: Secondary | ICD-10-CM | POA: Diagnosis not present

## 2022-12-15 DIAGNOSIS — I480 Paroxysmal atrial fibrillation: Secondary | ICD-10-CM | POA: Diagnosis not present

## 2022-12-15 DIAGNOSIS — N1832 Chronic kidney disease, stage 3b: Secondary | ICD-10-CM | POA: Diagnosis not present

## 2022-12-15 DIAGNOSIS — C3490 Malignant neoplasm of unspecified part of unspecified bronchus or lung: Secondary | ICD-10-CM | POA: Diagnosis not present

## 2022-12-15 DIAGNOSIS — C155 Malignant neoplasm of lower third of esophagus: Secondary | ICD-10-CM | POA: Diagnosis not present

## 2022-12-15 DIAGNOSIS — Z86711 Personal history of pulmonary embolism: Secondary | ICD-10-CM | POA: Diagnosis not present

## 2022-12-15 DIAGNOSIS — K219 Gastro-esophageal reflux disease without esophagitis: Secondary | ICD-10-CM | POA: Diagnosis not present

## 2022-12-15 DIAGNOSIS — D126 Benign neoplasm of colon, unspecified: Secondary | ICD-10-CM | POA: Diagnosis not present

## 2022-12-15 DIAGNOSIS — K589 Irritable bowel syndrome without diarrhea: Secondary | ICD-10-CM | POA: Diagnosis not present

## 2022-12-16 ENCOUNTER — Inpatient Hospital Stay: Payer: Medicare Other | Attending: Hematology

## 2022-12-16 ENCOUNTER — Other Ambulatory Visit: Payer: Self-pay

## 2022-12-16 VITALS — BP 128/68 | HR 78 | Resp 18

## 2022-12-16 DIAGNOSIS — Z7189 Other specified counseling: Secondary | ICD-10-CM

## 2022-12-16 DIAGNOSIS — C7951 Secondary malignant neoplasm of bone: Secondary | ICD-10-CM | POA: Insufficient documentation

## 2022-12-16 DIAGNOSIS — K521 Toxic gastroenteritis and colitis: Secondary | ICD-10-CM | POA: Insufficient documentation

## 2022-12-16 DIAGNOSIS — R197 Diarrhea, unspecified: Secondary | ICD-10-CM

## 2022-12-16 DIAGNOSIS — C155 Malignant neoplasm of lower third of esophagus: Secondary | ICD-10-CM

## 2022-12-16 DIAGNOSIS — K529 Noninfective gastroenteritis and colitis, unspecified: Secondary | ICD-10-CM

## 2022-12-16 DIAGNOSIS — Z95828 Presence of other vascular implants and grafts: Secondary | ICD-10-CM

## 2022-12-16 DIAGNOSIS — C3490 Malignant neoplasm of unspecified part of unspecified bronchus or lung: Secondary | ICD-10-CM | POA: Diagnosis not present

## 2022-12-16 MED ORDER — OCTREOTIDE ACETATE 30 MG IM KIT
30.0000 mg | PACK | Freq: Once | INTRAMUSCULAR | Status: AC
  Administered 2022-12-16: 30 mg via INTRAMUSCULAR
  Filled 2022-12-16: qty 1

## 2022-12-19 DIAGNOSIS — Z9181 History of falling: Secondary | ICD-10-CM | POA: Diagnosis not present

## 2022-12-19 DIAGNOSIS — B0230 Zoster ocular disease, unspecified: Secondary | ICD-10-CM | POA: Diagnosis not present

## 2022-12-19 DIAGNOSIS — C3490 Malignant neoplasm of unspecified part of unspecified bronchus or lung: Secondary | ICD-10-CM | POA: Diagnosis not present

## 2022-12-19 DIAGNOSIS — N3000 Acute cystitis without hematuria: Secondary | ICD-10-CM | POA: Diagnosis not present

## 2022-12-19 DIAGNOSIS — N1832 Chronic kidney disease, stage 3b: Secondary | ICD-10-CM | POA: Diagnosis not present

## 2022-12-19 DIAGNOSIS — K589 Irritable bowel syndrome without diarrhea: Secondary | ICD-10-CM | POA: Diagnosis not present

## 2022-12-19 DIAGNOSIS — D849 Immunodeficiency, unspecified: Secondary | ICD-10-CM | POA: Diagnosis not present

## 2022-12-19 DIAGNOSIS — I129 Hypertensive chronic kidney disease with stage 1 through stage 4 chronic kidney disease, or unspecified chronic kidney disease: Secondary | ICD-10-CM | POA: Diagnosis not present

## 2022-12-19 DIAGNOSIS — H5461 Unqualified visual loss, right eye, normal vision left eye: Secondary | ICD-10-CM | POA: Diagnosis not present

## 2022-12-19 DIAGNOSIS — K449 Diaphragmatic hernia without obstruction or gangrene: Secondary | ICD-10-CM | POA: Diagnosis not present

## 2022-12-19 DIAGNOSIS — E559 Vitamin D deficiency, unspecified: Secondary | ICD-10-CM | POA: Diagnosis not present

## 2022-12-19 DIAGNOSIS — E785 Hyperlipidemia, unspecified: Secondary | ICD-10-CM | POA: Diagnosis not present

## 2022-12-19 DIAGNOSIS — K219 Gastro-esophageal reflux disease without esophagitis: Secondary | ICD-10-CM | POA: Diagnosis not present

## 2022-12-19 DIAGNOSIS — J432 Centrilobular emphysema: Secondary | ICD-10-CM | POA: Diagnosis not present

## 2022-12-19 DIAGNOSIS — D126 Benign neoplasm of colon, unspecified: Secondary | ICD-10-CM | POA: Diagnosis not present

## 2022-12-19 DIAGNOSIS — K2270 Barrett's esophagus without dysplasia: Secondary | ICD-10-CM | POA: Diagnosis not present

## 2022-12-19 DIAGNOSIS — C7951 Secondary malignant neoplasm of bone: Secondary | ICD-10-CM | POA: Diagnosis not present

## 2022-12-19 DIAGNOSIS — I480 Paroxysmal atrial fibrillation: Secondary | ICD-10-CM | POA: Diagnosis not present

## 2022-12-19 DIAGNOSIS — K5790 Diverticulosis of intestine, part unspecified, without perforation or abscess without bleeding: Secondary | ICD-10-CM | POA: Diagnosis not present

## 2022-12-19 DIAGNOSIS — Z86711 Personal history of pulmonary embolism: Secondary | ICD-10-CM | POA: Diagnosis not present

## 2022-12-19 DIAGNOSIS — C155 Malignant neoplasm of lower third of esophagus: Secondary | ICD-10-CM | POA: Diagnosis not present

## 2022-12-19 DIAGNOSIS — F32A Depression, unspecified: Secondary | ICD-10-CM | POA: Diagnosis not present

## 2022-12-19 DIAGNOSIS — B0229 Other postherpetic nervous system involvement: Secondary | ICD-10-CM | POA: Diagnosis not present

## 2022-12-19 DIAGNOSIS — Z86718 Personal history of other venous thrombosis and embolism: Secondary | ICD-10-CM | POA: Diagnosis not present

## 2022-12-23 DIAGNOSIS — H02213 Cicatricial lagophthalmos right eye, unspecified eyelid: Secondary | ICD-10-CM | POA: Diagnosis not present

## 2022-12-27 DIAGNOSIS — N1832 Chronic kidney disease, stage 3b: Secondary | ICD-10-CM | POA: Diagnosis not present

## 2022-12-27 DIAGNOSIS — C3490 Malignant neoplasm of unspecified part of unspecified bronchus or lung: Secondary | ICD-10-CM | POA: Diagnosis not present

## 2022-12-27 DIAGNOSIS — K589 Irritable bowel syndrome without diarrhea: Secondary | ICD-10-CM | POA: Diagnosis not present

## 2022-12-27 DIAGNOSIS — K449 Diaphragmatic hernia without obstruction or gangrene: Secondary | ICD-10-CM | POA: Diagnosis not present

## 2022-12-27 DIAGNOSIS — K5790 Diverticulosis of intestine, part unspecified, without perforation or abscess without bleeding: Secondary | ICD-10-CM | POA: Diagnosis not present

## 2022-12-27 DIAGNOSIS — I480 Paroxysmal atrial fibrillation: Secondary | ICD-10-CM | POA: Diagnosis not present

## 2022-12-27 DIAGNOSIS — B0229 Other postherpetic nervous system involvement: Secondary | ICD-10-CM | POA: Diagnosis not present

## 2022-12-27 DIAGNOSIS — I129 Hypertensive chronic kidney disease with stage 1 through stage 4 chronic kidney disease, or unspecified chronic kidney disease: Secondary | ICD-10-CM | POA: Diagnosis not present

## 2022-12-27 DIAGNOSIS — F32A Depression, unspecified: Secondary | ICD-10-CM | POA: Diagnosis not present

## 2022-12-27 DIAGNOSIS — N3000 Acute cystitis without hematuria: Secondary | ICD-10-CM | POA: Diagnosis not present

## 2022-12-27 DIAGNOSIS — B0230 Zoster ocular disease, unspecified: Secondary | ICD-10-CM | POA: Diagnosis not present

## 2022-12-27 DIAGNOSIS — C155 Malignant neoplasm of lower third of esophagus: Secondary | ICD-10-CM | POA: Diagnosis not present

## 2022-12-27 DIAGNOSIS — C7951 Secondary malignant neoplasm of bone: Secondary | ICD-10-CM | POA: Diagnosis not present

## 2022-12-27 DIAGNOSIS — H5461 Unqualified visual loss, right eye, normal vision left eye: Secondary | ICD-10-CM | POA: Diagnosis not present

## 2022-12-27 DIAGNOSIS — D849 Immunodeficiency, unspecified: Secondary | ICD-10-CM | POA: Diagnosis not present

## 2022-12-27 DIAGNOSIS — Z86711 Personal history of pulmonary embolism: Secondary | ICD-10-CM | POA: Diagnosis not present

## 2022-12-27 DIAGNOSIS — J432 Centrilobular emphysema: Secondary | ICD-10-CM | POA: Diagnosis not present

## 2022-12-27 DIAGNOSIS — Z86718 Personal history of other venous thrombosis and embolism: Secondary | ICD-10-CM | POA: Diagnosis not present

## 2022-12-27 DIAGNOSIS — K2270 Barrett's esophagus without dysplasia: Secondary | ICD-10-CM | POA: Diagnosis not present

## 2022-12-27 DIAGNOSIS — K219 Gastro-esophageal reflux disease without esophagitis: Secondary | ICD-10-CM | POA: Diagnosis not present

## 2022-12-27 DIAGNOSIS — E785 Hyperlipidemia, unspecified: Secondary | ICD-10-CM | POA: Diagnosis not present

## 2022-12-27 DIAGNOSIS — E559 Vitamin D deficiency, unspecified: Secondary | ICD-10-CM | POA: Diagnosis not present

## 2022-12-27 DIAGNOSIS — Z9181 History of falling: Secondary | ICD-10-CM | POA: Diagnosis not present

## 2022-12-27 DIAGNOSIS — D126 Benign neoplasm of colon, unspecified: Secondary | ICD-10-CM | POA: Diagnosis not present

## 2022-12-30 DIAGNOSIS — Z86711 Personal history of pulmonary embolism: Secondary | ICD-10-CM | POA: Diagnosis not present

## 2022-12-30 DIAGNOSIS — H5461 Unqualified visual loss, right eye, normal vision left eye: Secondary | ICD-10-CM | POA: Diagnosis not present

## 2022-12-30 DIAGNOSIS — K2270 Barrett's esophagus without dysplasia: Secondary | ICD-10-CM | POA: Diagnosis not present

## 2022-12-30 DIAGNOSIS — F32A Depression, unspecified: Secondary | ICD-10-CM | POA: Diagnosis not present

## 2022-12-30 DIAGNOSIS — I129 Hypertensive chronic kidney disease with stage 1 through stage 4 chronic kidney disease, or unspecified chronic kidney disease: Secondary | ICD-10-CM | POA: Diagnosis not present

## 2022-12-30 DIAGNOSIS — Z86718 Personal history of other venous thrombosis and embolism: Secondary | ICD-10-CM | POA: Diagnosis not present

## 2022-12-30 DIAGNOSIS — K589 Irritable bowel syndrome without diarrhea: Secondary | ICD-10-CM | POA: Diagnosis not present

## 2022-12-30 DIAGNOSIS — N3000 Acute cystitis without hematuria: Secondary | ICD-10-CM | POA: Diagnosis not present

## 2022-12-30 DIAGNOSIS — J432 Centrilobular emphysema: Secondary | ICD-10-CM | POA: Diagnosis not present

## 2022-12-30 DIAGNOSIS — D126 Benign neoplasm of colon, unspecified: Secondary | ICD-10-CM | POA: Diagnosis not present

## 2022-12-30 DIAGNOSIS — E559 Vitamin D deficiency, unspecified: Secondary | ICD-10-CM | POA: Diagnosis not present

## 2022-12-30 DIAGNOSIS — E785 Hyperlipidemia, unspecified: Secondary | ICD-10-CM | POA: Diagnosis not present

## 2022-12-30 DIAGNOSIS — B0229 Other postherpetic nervous system involvement: Secondary | ICD-10-CM | POA: Diagnosis not present

## 2022-12-30 DIAGNOSIS — C155 Malignant neoplasm of lower third of esophagus: Secondary | ICD-10-CM | POA: Diagnosis not present

## 2022-12-30 DIAGNOSIS — I480 Paroxysmal atrial fibrillation: Secondary | ICD-10-CM | POA: Diagnosis not present

## 2022-12-30 DIAGNOSIS — C3490 Malignant neoplasm of unspecified part of unspecified bronchus or lung: Secondary | ICD-10-CM | POA: Diagnosis not present

## 2022-12-30 DIAGNOSIS — K219 Gastro-esophageal reflux disease without esophagitis: Secondary | ICD-10-CM | POA: Diagnosis not present

## 2022-12-30 DIAGNOSIS — B0230 Zoster ocular disease, unspecified: Secondary | ICD-10-CM | POA: Diagnosis not present

## 2022-12-30 DIAGNOSIS — K449 Diaphragmatic hernia without obstruction or gangrene: Secondary | ICD-10-CM | POA: Diagnosis not present

## 2022-12-30 DIAGNOSIS — K5790 Diverticulosis of intestine, part unspecified, without perforation or abscess without bleeding: Secondary | ICD-10-CM | POA: Diagnosis not present

## 2022-12-30 DIAGNOSIS — D849 Immunodeficiency, unspecified: Secondary | ICD-10-CM | POA: Diagnosis not present

## 2022-12-30 DIAGNOSIS — Z9181 History of falling: Secondary | ICD-10-CM | POA: Diagnosis not present

## 2022-12-30 DIAGNOSIS — N1832 Chronic kidney disease, stage 3b: Secondary | ICD-10-CM | POA: Diagnosis not present

## 2022-12-30 DIAGNOSIS — C7951 Secondary malignant neoplasm of bone: Secondary | ICD-10-CM | POA: Diagnosis not present

## 2022-12-31 DIAGNOSIS — J432 Centrilobular emphysema: Secondary | ICD-10-CM | POA: Diagnosis not present

## 2022-12-31 DIAGNOSIS — E559 Vitamin D deficiency, unspecified: Secondary | ICD-10-CM | POA: Diagnosis not present

## 2022-12-31 DIAGNOSIS — N3000 Acute cystitis without hematuria: Secondary | ICD-10-CM | POA: Diagnosis not present

## 2022-12-31 DIAGNOSIS — Z86718 Personal history of other venous thrombosis and embolism: Secondary | ICD-10-CM | POA: Diagnosis not present

## 2022-12-31 DIAGNOSIS — F32A Depression, unspecified: Secondary | ICD-10-CM | POA: Diagnosis not present

## 2022-12-31 DIAGNOSIS — C155 Malignant neoplasm of lower third of esophagus: Secondary | ICD-10-CM | POA: Diagnosis not present

## 2022-12-31 DIAGNOSIS — B0229 Other postherpetic nervous system involvement: Secondary | ICD-10-CM | POA: Diagnosis not present

## 2022-12-31 DIAGNOSIS — K589 Irritable bowel syndrome without diarrhea: Secondary | ICD-10-CM | POA: Diagnosis not present

## 2022-12-31 DIAGNOSIS — K5790 Diverticulosis of intestine, part unspecified, without perforation or abscess without bleeding: Secondary | ICD-10-CM | POA: Diagnosis not present

## 2022-12-31 DIAGNOSIS — K2270 Barrett's esophagus without dysplasia: Secondary | ICD-10-CM | POA: Diagnosis not present

## 2022-12-31 DIAGNOSIS — H5461 Unqualified visual loss, right eye, normal vision left eye: Secondary | ICD-10-CM | POA: Diagnosis not present

## 2022-12-31 DIAGNOSIS — I129 Hypertensive chronic kidney disease with stage 1 through stage 4 chronic kidney disease, or unspecified chronic kidney disease: Secondary | ICD-10-CM | POA: Diagnosis not present

## 2022-12-31 DIAGNOSIS — D126 Benign neoplasm of colon, unspecified: Secondary | ICD-10-CM | POA: Diagnosis not present

## 2022-12-31 DIAGNOSIS — C7951 Secondary malignant neoplasm of bone: Secondary | ICD-10-CM | POA: Diagnosis not present

## 2022-12-31 DIAGNOSIS — K219 Gastro-esophageal reflux disease without esophagitis: Secondary | ICD-10-CM | POA: Diagnosis not present

## 2022-12-31 DIAGNOSIS — Z86711 Personal history of pulmonary embolism: Secondary | ICD-10-CM | POA: Diagnosis not present

## 2022-12-31 DIAGNOSIS — K449 Diaphragmatic hernia without obstruction or gangrene: Secondary | ICD-10-CM | POA: Diagnosis not present

## 2022-12-31 DIAGNOSIS — N1832 Chronic kidney disease, stage 3b: Secondary | ICD-10-CM | POA: Diagnosis not present

## 2022-12-31 DIAGNOSIS — C3490 Malignant neoplasm of unspecified part of unspecified bronchus or lung: Secondary | ICD-10-CM | POA: Diagnosis not present

## 2022-12-31 DIAGNOSIS — Z9181 History of falling: Secondary | ICD-10-CM | POA: Diagnosis not present

## 2022-12-31 DIAGNOSIS — E785 Hyperlipidemia, unspecified: Secondary | ICD-10-CM | POA: Diagnosis not present

## 2022-12-31 DIAGNOSIS — D849 Immunodeficiency, unspecified: Secondary | ICD-10-CM | POA: Diagnosis not present

## 2022-12-31 DIAGNOSIS — I480 Paroxysmal atrial fibrillation: Secondary | ICD-10-CM | POA: Diagnosis not present

## 2022-12-31 DIAGNOSIS — B0230 Zoster ocular disease, unspecified: Secondary | ICD-10-CM | POA: Diagnosis not present

## 2023-01-03 DIAGNOSIS — C3490 Malignant neoplasm of unspecified part of unspecified bronchus or lung: Secondary | ICD-10-CM | POA: Diagnosis not present

## 2023-01-03 DIAGNOSIS — K5790 Diverticulosis of intestine, part unspecified, without perforation or abscess without bleeding: Secondary | ICD-10-CM | POA: Diagnosis not present

## 2023-01-03 DIAGNOSIS — B0229 Other postherpetic nervous system involvement: Secondary | ICD-10-CM | POA: Diagnosis not present

## 2023-01-03 DIAGNOSIS — H5461 Unqualified visual loss, right eye, normal vision left eye: Secondary | ICD-10-CM | POA: Diagnosis not present

## 2023-01-03 DIAGNOSIS — C7951 Secondary malignant neoplasm of bone: Secondary | ICD-10-CM | POA: Diagnosis not present

## 2023-01-03 DIAGNOSIS — F32A Depression, unspecified: Secondary | ICD-10-CM | POA: Diagnosis not present

## 2023-01-03 DIAGNOSIS — B0230 Zoster ocular disease, unspecified: Secondary | ICD-10-CM | POA: Diagnosis not present

## 2023-01-03 DIAGNOSIS — M6281 Muscle weakness (generalized): Secondary | ICD-10-CM | POA: Diagnosis not present

## 2023-01-03 DIAGNOSIS — K2270 Barrett's esophagus without dysplasia: Secondary | ICD-10-CM | POA: Diagnosis not present

## 2023-01-03 DIAGNOSIS — D126 Benign neoplasm of colon, unspecified: Secondary | ICD-10-CM | POA: Diagnosis not present

## 2023-01-03 DIAGNOSIS — I480 Paroxysmal atrial fibrillation: Secondary | ICD-10-CM | POA: Diagnosis not present

## 2023-01-03 DIAGNOSIS — E785 Hyperlipidemia, unspecified: Secondary | ICD-10-CM | POA: Diagnosis not present

## 2023-01-03 DIAGNOSIS — K589 Irritable bowel syndrome without diarrhea: Secondary | ICD-10-CM | POA: Diagnosis not present

## 2023-01-03 DIAGNOSIS — N3000 Acute cystitis without hematuria: Secondary | ICD-10-CM | POA: Diagnosis not present

## 2023-01-03 DIAGNOSIS — K219 Gastro-esophageal reflux disease without esophagitis: Secondary | ICD-10-CM | POA: Diagnosis not present

## 2023-01-03 DIAGNOSIS — N1832 Chronic kidney disease, stage 3b: Secondary | ICD-10-CM | POA: Diagnosis not present

## 2023-01-03 DIAGNOSIS — D849 Immunodeficiency, unspecified: Secondary | ICD-10-CM | POA: Diagnosis not present

## 2023-01-03 DIAGNOSIS — Z86711 Personal history of pulmonary embolism: Secondary | ICD-10-CM | POA: Diagnosis not present

## 2023-01-03 DIAGNOSIS — Z9181 History of falling: Secondary | ICD-10-CM | POA: Diagnosis not present

## 2023-01-03 DIAGNOSIS — E559 Vitamin D deficiency, unspecified: Secondary | ICD-10-CM | POA: Diagnosis not present

## 2023-01-03 DIAGNOSIS — I129 Hypertensive chronic kidney disease with stage 1 through stage 4 chronic kidney disease, or unspecified chronic kidney disease: Secondary | ICD-10-CM | POA: Diagnosis not present

## 2023-01-03 DIAGNOSIS — J432 Centrilobular emphysema: Secondary | ICD-10-CM | POA: Diagnosis not present

## 2023-01-03 DIAGNOSIS — C155 Malignant neoplasm of lower third of esophagus: Secondary | ICD-10-CM | POA: Diagnosis not present

## 2023-01-03 DIAGNOSIS — Z86718 Personal history of other venous thrombosis and embolism: Secondary | ICD-10-CM | POA: Diagnosis not present

## 2023-01-03 DIAGNOSIS — K449 Diaphragmatic hernia without obstruction or gangrene: Secondary | ICD-10-CM | POA: Diagnosis not present

## 2023-01-06 DIAGNOSIS — B028 Zoster with other complications: Secondary | ICD-10-CM | POA: Diagnosis not present

## 2023-01-17 DIAGNOSIS — H1830 Unspecified corneal membrane change: Secondary | ICD-10-CM | POA: Diagnosis not present

## 2023-01-20 ENCOUNTER — Inpatient Hospital Stay: Payer: Medicare Other

## 2023-01-20 ENCOUNTER — Inpatient Hospital Stay: Payer: Medicare Other | Attending: Hematology | Admitting: Hematology

## 2023-01-20 ENCOUNTER — Other Ambulatory Visit: Payer: Self-pay

## 2023-01-20 VITALS — BP 142/68 | HR 76 | Temp 97.7°F | Resp 18 | Wt 144.9 lb

## 2023-01-20 DIAGNOSIS — C155 Malignant neoplasm of lower third of esophagus: Secondary | ICD-10-CM

## 2023-01-20 DIAGNOSIS — C3490 Malignant neoplasm of unspecified part of unspecified bronchus or lung: Secondary | ICD-10-CM

## 2023-01-20 DIAGNOSIS — K521 Toxic gastroenteritis and colitis: Secondary | ICD-10-CM

## 2023-01-20 DIAGNOSIS — R197 Diarrhea, unspecified: Secondary | ICD-10-CM

## 2023-01-20 DIAGNOSIS — B0230 Zoster ocular disease, unspecified: Secondary | ICD-10-CM | POA: Diagnosis not present

## 2023-01-20 DIAGNOSIS — C7951 Secondary malignant neoplasm of bone: Secondary | ICD-10-CM | POA: Diagnosis not present

## 2023-01-20 DIAGNOSIS — Z7189 Other specified counseling: Secondary | ICD-10-CM

## 2023-01-20 DIAGNOSIS — Z95828 Presence of other vascular implants and grafts: Secondary | ICD-10-CM

## 2023-01-20 DIAGNOSIS — K529 Noninfective gastroenteritis and colitis, unspecified: Secondary | ICD-10-CM

## 2023-01-20 DIAGNOSIS — C3491 Malignant neoplasm of unspecified part of right bronchus or lung: Secondary | ICD-10-CM

## 2023-01-20 LAB — CMP (CANCER CENTER ONLY)
ALT: 6 U/L (ref 0–44)
AST: 17 U/L (ref 15–41)
Albumin: 3.1 g/dL — ABNORMAL LOW (ref 3.5–5.0)
Alkaline Phosphatase: 70 U/L (ref 38–126)
Anion gap: 5 (ref 5–15)
BUN: 10 mg/dL (ref 8–23)
CO2: 26 mmol/L (ref 22–32)
Calcium: 8.1 mg/dL — ABNORMAL LOW (ref 8.9–10.3)
Chloride: 110 mmol/L (ref 98–111)
Creatinine: 0.76 mg/dL (ref 0.44–1.00)
GFR, Estimated: >60 mL/min (ref >60)
Glucose, Bld: 146 mg/dL — ABNORMAL HIGH (ref 70–99)
Potassium: 3.7 mmol/L (ref 3.5–5.1)
Sodium: 141 mmol/L (ref 135–145)
Total Bilirubin: 0.3 mg/dL (ref 0.3–1.2)
Total Protein: 6.7 g/dL (ref 6.5–8.1)

## 2023-01-20 LAB — CBC WITH DIFFERENTIAL (CANCER CENTER ONLY)
Abs Immature Granulocytes: 0.02 10*3/uL (ref 0.00–0.07)
Basophils Absolute: 0.1 10*3/uL (ref 0.0–0.1)
Basophils Relative: 1 %
Eosinophils Absolute: 0.2 10*3/uL (ref 0.0–0.5)
Eosinophils Relative: 2 %
HCT: 36.3 % (ref 36.0–46.0)
Hemoglobin: 12.1 g/dL (ref 12.0–15.0)
Immature Granulocytes: 0 %
Lymphocytes Relative: 18 %
Lymphs Abs: 1.8 10*3/uL (ref 0.7–4.0)
MCH: 33.1 pg (ref 26.0–34.0)
MCHC: 33.3 g/dL (ref 30.0–36.0)
MCV: 99.2 fL (ref 80.0–100.0)
Monocytes Absolute: 0.9 10*3/uL (ref 0.1–1.0)
Monocytes Relative: 9 %
Neutro Abs: 7.3 10*3/uL (ref 1.7–7.7)
Neutrophils Relative %: 70 %
Platelet Count: 326 10*3/uL (ref 150–400)
RBC: 3.66 MIL/uL — ABNORMAL LOW (ref 3.87–5.11)
RDW: 15 % (ref 11.5–15.5)
WBC Count: 10.5 10*3/uL (ref 4.0–10.5)
nRBC: 0 % (ref 0.0–0.2)

## 2023-01-20 MED ORDER — HEPARIN SOD (PORK) LOCK FLUSH 100 UNIT/ML IV SOLN
500.0000 [IU] | Freq: Once | INTRAVENOUS | Status: AC
  Administered 2023-01-20: 500 [IU]

## 2023-01-20 MED ORDER — SODIUM CHLORIDE 0.9% FLUSH
10.0000 mL | Freq: Once | INTRAVENOUS | Status: AC
  Administered 2023-01-20: 10 mL

## 2023-01-20 MED ORDER — DENOSUMAB 120 MG/1.7ML ~~LOC~~ SOLN
120.0000 mg | Freq: Once | SUBCUTANEOUS | Status: AC
  Administered 2023-01-20: 120 mg via SUBCUTANEOUS
  Filled 2023-01-20: qty 1.7

## 2023-01-20 MED ORDER — OCTREOTIDE ACETATE 30 MG IM KIT
30.0000 mg | PACK | Freq: Once | INTRAMUSCULAR | Status: AC
  Administered 2023-01-20: 30 mg via INTRAMUSCULAR
  Filled 2023-01-20: qty 1

## 2023-01-20 NOTE — Patient Instructions (Signed)
Octreotide Injection Solution What is this medication? OCTREOTIDE (ok TREE oh tide) treats high levels of growth hormone (acromegaly). It works by reducing the amount of growth hormone your body makes. This reduces symptoms and the risk of health problems caused by too much growth hormone, such as diabetes and heart disease. It may also be used to treat diarrhea caused by neuroendocrine tumors. It works by slowing down the release of serotonin from the tumor cells. This reduces the number of bowel movements you have. This medicine may be used for other purposes; ask your health care provider or pharmacist if you have questions. COMMON BRAND NAME(S): Bynfezia, Sandostatin What should I tell my care team before I take this medication? They need to know if you have any of these conditions: Diabetes Gallbladder disease Kidney disease Liver disease Thyroid disease An unusual or allergic reaction to octreotide, other medications, foods, dyes, or preservatives Pregnant or trying to get pregnant Breast-feeding How should I use this medication? This medication is injected under the skin or into a vein. It is usually given by your care team in a hospital or clinic setting. If you get this medication at home, you will be taught how to prepare and give it. Use exactly as directed. Take it as directed on the prescription label at the same time every day. Keep taking it unless your care team tells you to stop. Allow the injection solution to come to room temperature before use. Do not warm it artificially. It is important that you put your used needles and syringes in a special sharps container. Do not put them in a trash can. If you do not have a sharps container, call your pharmacist or care team to get one. Talk to your care team about the use of this medication in children. Special care may be needed. Overdosage: If you think you have taken too much of this medicine contact a poison control center or  emergency room at once. NOTE: This medicine is only for you. Do not share this medicine with others. What if I miss a dose? If you miss a dose, take it as soon as you can. If it is almost time for your next dose, take only that dose. Do not take double or extra doses. What may interact with this medication? Bromocriptine Certain medications for blood pressure, heart disease, irregular heartbeat Cyclosporine Diuretics Medications for diabetes, including insulin Quinidine This list may not describe all possible interactions. Give your health care provider a list of all the medicines, herbs, non-prescription drugs, or dietary supplements you use. Also tell them if you smoke, drink alcohol, or use illegal drugs. Some items may interact with your medicine. What should I watch for while using this medication? Visit your care team for regular checks on your progress. Tell your care team if your symptoms do not start to get better or if they get worse. To help reduce irritation at the injection site, use a different site for each injection and make sure the solution is at room temperature before use. This medication may cause decreases in blood sugar. Signs of low blood sugar include chills, cool, pale skin or cold sweats, drowsiness, extreme hunger, fast heartbeat, headache, nausea, nervousness or anxiety, shakiness, trembling, unsteadiness, tiredness, or weakness. Contact your care team right away if you experience any of these symptoms. This medication may increase blood sugar. The risk may be higher in patients who already have diabetes. Ask your care team what you can do to lower your   risk of diabetes while taking this medication. You should make sure you get enough vitamin B12 while you are taking this medication. Discuss the foods you eat and the vitamins you take with your care team. What side effects may I notice from receiving this medication? Side effects that you should report to your care  team as soon as possible: Allergic reactions--skin rash, itching, hives, swelling of the face, lips, tongue, or throat Gallbladder problems--severe stomach pain, nausea, vomiting, fever Heart rhythm changes--fast or irregular heartbeat, dizziness, feeling faint or lightheaded, chest pain, trouble breathing High blood sugar (hyperglycemia)--increased thirst or amount of urine, unusual weakness or fatigue, blurry vision Low blood sugar (hypoglycemia)--tremors or shaking, anxiety, sweating, cold or clammy skin, confusion, dizziness, rapid heartbeat Low thyroid levels (hypothyroidism)--unusual weakness or fatigue, increased sensitivity to cold, constipation, hair loss, dry skin, weight gain, feelings of depression Low vitamin B12 level--pain, tingling, or numbness in the hands or feet, muscle weakness, dizziness, confusion, trouble concentrating Pancreatitis--severe stomach pain that spreads to your back or gets worse after eating or when touched, fever, nausea, vomiting Side effects that usually do not require medical attention (report to your care team if they continue or are bothersome): Diarrhea Dizziness Gas Headache Pain, redness, or irritation at injection site Stomach pain This list may not describe all possible side effects. Call your doctor for medical advice about side effects. You may report side effects to FDA at 1-800-FDA-1088. Where should I keep my medication? Keep out of the reach of children and pets. Store in the refrigerator. Protect from light. Allow to come to room temperature naturally. Do not use artificial heat. If protected from light, the injection may be stored between 20 and 30 degrees C (70 and 86 degrees F) for 14 days. After the initial use, throw away any unused portion of a multiple dose vial after 14 days. Get rid of any unused portions of the ampules after use. To get rid of medications that are no longer needed or have expired: Take the medication to a medication  take-back program. Ask your pharmacy or law enforcement to find a location. If you cannot return the medication, ask your pharmacist or care team how to get rid of the medication safely. NOTE: This sheet is a summary. It may not cover all possible information. If you have questions about this medicine, talk to your doctor, pharmacist, or health care provider. Denosumab Injection (Oncology) What is this medication? DENOSUMAB (den oh SUE mab) prevents weakened bones caused by cancer. It may also be used to treat noncancerous bone tumors that cannot be removed by surgery. It can also be used to treat high calcium levels in the blood caused by cancer. It works by blocking a protein that causes bones to break down quickly. This slows down the release of calcium from bones, which lowers calcium levels in your blood. It also makes your bones stronger and less likely to break (fracture). This medicine may be used for other purposes; ask your health care provider or pharmacist if you have questions. COMMON BRAND NAME(S): XGEVA What should I tell my care team before I take this medication? They need to know if you have any of these conditions: Dental disease Having surgery or tooth extraction Infection Kidney disease Low levels of calcium or vitamin D in the blood Malnutrition On hemodialysis Skin conditions or sensitivity Thyroid or parathyroid disease An unusual reaction to denosumab, other medications, foods, dyes, or preservatives Pregnant or trying to get pregnant Breast-feeding How should I  use this medication? This medication is for injection under the skin. It is given by your care team in a hospital or clinic setting. A special MedGuide will be given to you before each treatment. Be sure to read this information carefully each time. Talk to your care team about the use of this medication in children. While it may be prescribed for children as young as 13 years for selected conditions,  precautions do apply. Overdosage: If you think you have taken too much of this medicine contact a poison control center or emergency room at once. NOTE: This medicine is only for you. Do not share this medicine with others. What if I miss a dose? Keep appointments for follow-up doses. It is important not to miss your dose. Call your care team if you are unable to keep an appointment. What may interact with this medication? Do not take this medication with any of the following: Other medications containing denosumab This medication may also interact with the following: Medications that lower your chance of fighting infection Steroid medications, such as prednisone or cortisone This list may not describe all possible interactions. Give your health care provider a list of all the medicines, herbs, non-prescription drugs, or dietary supplements you use. Also tell them if you smoke, drink alcohol, or use illegal drugs. Some items may interact with your medicine. What should I watch for while using this medication? Your condition will be monitored carefully while you are receiving this medication. You may need blood work while taking this medication. This medication may increase your risk of getting an infection. Call your care team for advice if you get a fever, chills, sore throat, or other symptoms of a cold or flu. Do not treat yourself. Try to avoid being around people who are sick. You should make sure you get enough calcium and vitamin D while you are taking this medication, unless your care team tells you not to. Discuss the foods you eat and the vitamins you take with your care team. Some people who take this medication have severe bone, joint, or muscle pain. This medication may also increase your risk for jaw problems or a broken thigh bone. Tell your care team right away if you have severe pain in your jaw, bones, joints, or muscles. Tell your care team if you have any pain that does not go  away or that gets worse. Talk to your care team if you may be pregnant. Serious birth defects can occur if you take this medication during pregnancy and for 5 months after the last dose. You will need a negative pregnancy test before starting this medication. Contraception is recommended while taking this medication and for 5 months after the last dose. Your care team can help you find the option that works for you. What side effects may I notice from receiving this medication? Side effects that you should report to your care team as soon as possible: Allergic reactions--skin rash, itching, hives, swelling of the face, lips, tongue, or throat Bone, joint, or muscle pain Low calcium level--muscle pain or cramps, confusion, tingling, or numbness in the hands or feet Osteonecrosis of the jaw--pain, swelling, or redness in the mouth, numbness of the jaw, poor healing after dental work, unusual discharge from the mouth, visible bones in the mouth Side effects that usually do not require medical attention (report to your care team if they continue or are bothersome): Cough Diarrhea Fatigue Headache Nausea This list may not describe all possible side effects.  Call your doctor for medical advice about side effects. You may report side effects to FDA at 1-800-FDA-1088. Where should I keep my medication? This medication is given in a hospital or clinic. It will not be stored at home. NOTE: This sheet is a summary. It may not cover all possible information. If you have questions about this medicine, talk to your doctor, pharmacist, or health care provider.  2023 [employer] (2022-04-11 00:00:00)   2023 [employer] (2022-02-23 00:00:00)

## 2023-01-20 NOTE — Progress Notes (Signed)
HEMATOLOGY ONCOLOGY CLINIC VISIT NOTE  Date of service:  01/20/23    Patient Care Team: [name], PA-C as PCP - General (Physician Assistant) [name], MD as Consulting Physician (Hematology and Oncology)  CHIEF COMPLAINTS/PURPOSE OF CONSULTATION:  Follow-up for continued evaluation and management of metastatic lung cancer Follow-up for esophageal cancer  DIAGNOSIS:   #1 Metastatic non-small cell lung cancer with bilateral lung nodules and large metastatic lesion in the left Ilium. 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 Also had c diff colitis - treated   Current Treatment  1) Active surveillance 2) Xgeva 120mg  [place] q12weeks for bone metastases. 3) Sandostatin q4weeks for diarrhea   Previous Treatment  For metastatic lung cancer 1 Palliative radiation therapy to the large left ilium metastases 2. IV Nivolumab x 20 cycles (discontinued due to likely immune colitis) 3. Xgeva 120mg  [place] q4weeks for bone metastases.  For Esophageal adenocarcinoma S/p Concurrent carbo/taxol + RT  HISTORY OF PRESENTING ILLNESS: (plz see my previous consultation for details of initial presentation)  INTERVAL HISTORY:  Patient is here for continued evaluation and management of pleural metastatic lung cancer and esophageal cancer and her diffuse.  Patient was last seen by me on 10/21/22 and noted a facial injury from her severe herpes ulcer infection. She complained of returned diarrhea but was doing well otherwise.   Today, she complains of right eye pain related to her severe shingles infection. Her pain has not improved and she occasionally endorses a burning sensation. She has not used a lidocaine patch for this. No new shingles outbreaks in any other areas. She is compliantly taking Lyrica and Valtrex. She has discontinued Marinol.  She complains that she consistently needs to hold her right eyebrow area due  to the pain. She will have an eye procedure performed by her GI doctor on March 14th, 2024. Otherwise, she feels good overall.  No other new infections, abdominal pain, leg swelling, no new bone pain, or hip pain. However, she does endorse occasional left leg pain which does not limit her walking.   She does complain of consistent SOB. And takes Aspirin regularly. She reports cornea issues and having vision difficulty. She continues to engage in physical therapy and her appetite is okay. She denies any swallowing issues, chest pain, abdominal pain, or leg swelling. She has gained weight since her last visit and weighs 144 pounds today.  MEDICAL HISTORY:  Past Medical History:  Diagnosis Date   Barrett's esophagus    Bilateral pulmonary embolism ([place]) 09/07/2016   09/02/16 bilateral pulmonary emboli in context of extensive bilateral lower extremity deep venous thromboses Assumed hypercoagulability due to non-small cell metastatic lung cancer Lifelong anticoagulation recommended   Bone neoplasm 06/24/2015   Cancer ([place])    metastatic poorly differentiated carcinoma. tumor left groin surgical removal with radiation tx.   Cataract    BILATERAL   Cigarette smoker two packs a day or less    Currently still smoking 2 PPD - Not interested in quitting at this time.   Colitis 2017   Colon polyps    hyperplastic, tubular adenomas, tubulovillous adenoma   Cough, persistent    hx. lung cancer ? primary-being evaluated, unsure of primary site.   Depression 06/24/2015   Diverticulosis    DVT of lower extremity, bilateral ([place]) 09/07/2016   Emphysema of lung ([place])    Endometriosis    Hysterectomy with BSO at age 54 yrs   Esophageal  adenocarcinoma ([place]) 08/11/15   intramucosal   Gastritis    GERD (gastroesophageal reflux disease)    H/O: pneumonia    Hiatal hernia    Hyperlipidemia    Hypertension 06/24/2015   likely improved incidental to 40 lbs weight loss from her neoplasm. No Longer taking med for  this as of 08-06-15   IBS (irritable bowel syndrome)    Pain    left hip-persistent"tumor of bone"-radiation tx. 10.   Vitamin D deficiency disease    SURGICAL HISTORY: Past Surgical History:  Procedure Laterality Date   ABDOMINAL HYSTERECTOMY     BALLOON DILATION N/A 10/08/2019   Procedure: BALLOON DILATION;  Surgeon: [name], MD;  Location: [facility];  Service: Gastroenterology;  Laterality: N/A;   BARTHOLIN GLAND CYST EXCISION  82 yo ago   Does not want if it was an infected cyst or tumor. Was soon as delivery   BIOPSY  01/02/2019   Procedure: BIOPSY;  Surgeon: [name], MD;  Location: [facility];  Service: Gastroenterology;;   CATARACT EXTRACTION     COLONOSCOPY W/ POLYPECTOMY     multiple times - last done 09/2014 per patient.   ESOPHAGOGASTRODUODENOSCOPY (EGD) WITH PROPOFOL N/A 08/11/2015   Procedure: ESOPHAGOGASTRODUODENOSCOPY (EGD) WITH PROPOFOL;  Surgeon: [name], MD;  Location: [facility];  Service: Gastroenterology;  Laterality: N/A;   ESOPHAGOGASTRODUODENOSCOPY (EGD) WITH PROPOFOL N/A 01/02/2019   Procedure: ESOPHAGOGASTRODUODENOSCOPY (EGD) WITH PROPOFOL;  Surgeon: [name], MD;  Location: [facility];  Service: Gastroenterology;  Laterality: N/A;   ESOPHAGOGASTRODUODENOSCOPY (EGD) WITH PROPOFOL N/A 10/08/2019   Procedure: ESOPHAGOGASTRODUODENOSCOPY (EGD) WITH PROPOFOL;  Surgeon: [name], MD;  Location: [facility];  Service: Gastroenterology;  Laterality: N/A;   FLEXIBLE SIGMOIDOSCOPY N/A 06/24/2017   Procedure: FLEXIBLE SIGMOIDOSCOPY;  Surgeon: Manus Gunning, MD;  Location: [facility];  Service: Gastroenterology;  Laterality: N/A;   GANGLION CYST EXCISION     KNEE ARTHROSCOPY  age about 64 yrs   TONSILLECTOMY     TOTAL ABDOMINAL HYSTERECTOMY W/ BILATERAL SALPINGOOPHORECTOMY  at age 30 yrs   For endometriosis    SOCIAL HISTORY: Social History   Socioeconomic History   Marital status: Widowed    Spouse name: Not on file    Number of children: 2   Years of education: Not on file   Highest education level: Not on file  Occupational History   Not on file  Tobacco Use   Smoking status: Former    Packs/day: 1.00    Years: 60.00    Total pack years: 60.00    Types: Cigarettes    Quit date: 12/05/2014    Years since quitting: 8.1   Smokeless tobacco: Never  Vaping Use   Vaping Use: Never used  Substance and Sexual Activity   Alcohol use: No    Alcohol/week: 0.0 standard drinks of alcohol   Drug use: No   Sexual activity: Not Currently  Other Topics Concern   Not on file  Social History Narrative   Not on file   Social Determinants of Health   Financial Resource Strain: Not on file  Food Insecurity: No Food Insecurity (09/05/2022)   Hunger Vital Sign    Worried About Running Out of Food in the Last Year: Never true    Ran Out of Food in the Last Year: Never true  Transportation Needs: No Transportation Needs (09/13/2022)   PRAPARE - Hydrologist (Medical): No    Lack of Transportation (Non-Medical): No  Physical Activity: Not on file  Stress: Not on file  Social Connections: Not on file  Intimate Partner Violence: Not At Risk (09/05/2022)   Humiliation, Afraid, Rape, and Kick questionnaire    Fear of Current or Ex-Partner: No    Emotionally Abused: No    Physically Abused: No    Sexually Abused: No    FAMILY HISTORY: Family History  Problem Relation Age of Onset   Colon cancer Brother    Colon cancer Brother    Stroke Mother    Colon cancer Father    Emphysema Father        smoked   Breast cancer Daughter 42       ER/PR+ stage II    ALLERGIES:  is allergic to penicillins, remeron [mirtazapine], and latex. patient wonders if she has a penicillin allergy but notes that she is uncertain about this.  MEDICATIONS:  Current Outpatient Medications  Medication Sig Dispense Refill   acetaminophen (TYLENOL) 325 MG tablet Take 2 tablets (650 mg total) by mouth  every 6 (six) hours as needed for mild pain (or Fever >/= 101). 20 tablet 0   Biotin 5 MG CAPS Take 5 mg by mouth daily.     buPROPion (WELLBUTRIN XL) 150 MG 24 hr tablet TAKE 1 TABLET BY MOUTH EVERY DAY 30 tablet 0   Calcium Citrate-Vitamin D (CALCIUM + D PO) Take 1 tablet by mouth daily.     Cyanocobalamin (B-12) 2500 MCG TABS Take 2,500 mcg by mouth daily.     dronabinol (MARINOL) 2.5 MG capsule Take 1 capsule (2.5 mg total) by mouth 2 (two) times daily before a meal. (Patient not taking: Reported on 09/04/2022) 60 capsule 0   escitalopram (LEXAPRO) 20 MG tablet TAKE 1 TABLET BY MOUTH EVERY DAY 90 tablet 1   feeding supplement (ENSURE ENLIVE / ENSURE PLUS) LIQD Take 237 mLs by mouth 2 (two) times daily between meals. 237 mL 12   fluconazole (DIFLUCAN) 200 MG tablet Take 200 mg by mouth daily.     Fluticasone-Umeclidin-Vilant (TRELEGY ELLIPTA) 200-62.5-25 MCG/ACT AEPB Inhale 1 puff into the lungs daily. 1 each 3   HYDROcodone-acetaminophen (NORCO) 5-325 MG tablet Take 1 tablet by mouth every 6 (six) hours as needed for moderate pain. 30 tablet 0   omeprazole (PRILOSEC) 40 MG capsule TAKE 1 CAPSULE BY MOUTH EVERY DAY BEFORE BREAKFAST 90 capsule 1   ondansetron (ZOFRAN-ODT) 4 MG disintegrating tablet Take 1 tablet (4 mg total) by mouth every 8 (eight) hours as needed for nausea or vomiting. 20 tablet 0   pregabalin (LYRICA) 50 MG capsule Take 1 capsule (50 mg total) by mouth 2 (two) times daily. 60 capsule 2   traZODone (DESYREL) 50 MG tablet Take 1 tablet (50 mg total) by mouth at bedtime as needed for sleep. 90 tablet 2   valACYclovir (VALTREX) 500 MG tablet TAKE 1 TABLET BY MOUTH TWICE A DAY 180 tablet 3   No current facility-administered medications for this visit.   REVIEW OF SYSTEMS:  10 Point review of Systems was done is negative except as noted above.   PHYSICAL EXAMINATION:  .There were no vitals taken for this visit.   GENERAL:alert, in no acute distress and comfortable SKIN: no  acute rashes, no significant lesions EYES: conjunctiva are pink and non-injected, sclera anicteric OROPHARYNX: MMM, no exudates, no oropharyngeal erythema or ulceration NECK: supple, no JVD LYMPH:  no palpable lymphadenopathy in the cervical, axillary or inguinal regions LUNGS: clear to auscultation b/l with normal respiratory  effort HEART: regular rate & rhythm ABDOMEN:  normoactive bowel sounds , non tender, not distended. Extremity: no pedal edema PSYCH: alert & oriented x 3 with fluent speech NEURO: no focal motor/sensory deficits   LABORATORY DATA:  I have reviewed the data as listed  .    Latest Ref Rng & Units 01/20/2023   12:38 PM 10/21/2022   12:41 PM 09/11/2022    3:22 AM  CBC  WBC 4.0 - 10.5 K/uL 10.5  11.1  10.7   Hemoglobin 12.0 - 15.0 g/dL 12.1  12.6  8.8   Hematocrit 36.0 - 46.0 % 36.3  37.5  26.5   Platelets 150 - 400 K/uL 326  349  402    ANC 1.8k .    Latest Ref Rng & Units 01/20/2023   12:38 PM 10/21/2022   12:41 PM 09/11/2022    3:22 AM  CMP  Glucose 70 - 99 mg/dL 146  163  91   BUN 8 - 23 mg/dL 10  21  15    Creatinine 0.44 - 1.00 mg/dL 0.76  0.86  0.94   Sodium 135 - 145 mmol/L 141  139  137   Potassium 3.5 - 5.1 mmol/L 3.7  3.7  4.0   Chloride 98 - 111 mmol/L 110  109  110   CO2 22 - 32 mmol/L 26  24  20    Calcium 8.9 - 10.3 mg/dL 8.1  10.1  7.4   Total Protein 6.5 - 8.1 g/dL 6.7  7.6  6.1   Total Bilirubin 0.3 - 1.2 mg/dL 0.3  0.4  0.7   Alkaline Phos 38 - 126 U/L 70  71  85   AST 15 - 41 U/L 17  24  23    ALT 0 - 44 U/L 6  16  <5         01/02/19 Esophagus Biopsy:    RADIOGRAPHIC STUDIES: .No results found.  ASSESSMENT & PLAN:   83 y.o. female with  #1 Metastatic poorly differentiated carcinoma with likely lung primary non-small cell lung cancer.    CT of the head with and without contrast showed no evidence of metastatic disease. EGFR blood test mutation analysis negative. CT chest abdomen pelvis 04/19/2016 shows no evidence of  disease progression. Patient tolerated Nivolumab very well but was discontinued when she developed grade 2 Immune colitis. Has been off Nivolumab for >6 months  CT chest abdomen pelvis on 06/24/2016 shows no evidence of new disease or progression of metastatic disease. CT chest abdomen pelvis 09/06/2016 shows 1. Mixed interval response to therapy. 2. There is a new left ventral chest wall lesion deep to the pectoralis musculature worrisome for metastatic disease. 3. Posterior lower lobe nodular densities are identified which may reflect areas of pulmonary metastasis. 4. Interval decrease in size of destructive lesion involving the left iliac bone.  CT chest abd pelvis 12/08/2016: Cystic mass involving the left ventral chest wall has resolved in the interval. Likely was a hematoma due to trauma. Interval increase in size of pleural base mass overlying the posterior and inferior left lower lobe. There is also a new left pleural effusion identified.  CT chest 02/01/2017: Residual irregular soft tissue thickening/volume loss and trace left pleural fluid at the base of the left hemithorax, overall improved in appearance from 12/08/2016. No measurable lesion.  CT chest 05/29/2017 shows no residual pleural based mass or significant pleural effusion in the left hemithorax. No evidence of thoracic metastatic disease. No evidence of progressive metastatic disease  within the abdomen or pelvis. Mixed lytic and blastic lesion involving the left iliac bone and associated pathologic fracture are unchanged.   CT CAP 09/14/17 shows no new changes. She does have slight displacement of her fractured left iliac bone. Evidence of stable disease.   CT CAP 01/04/2018- No new or progressive metastatic disease. Stable large left iliac bone metastasis with associated chronic pathologic fracture.   CT chest/abd/pelvis done on 04/26/18 revealed Stable exam.  No new or progressive interval findings.  07/19/18 CT C/A/P revealed Stable  exam.  No new or progressive interval findings. Large destructive left iliac lesion is similar to prior. Aortic Atherosclerosis and Emphysema.    11/06/18 CT C/A/P revealed Similar appearance of large mixed lytic and sclerotic lesion in the left ilium. No new metastatic lesions are otherwise noted elsewhere in the chest, abdomen or pelvis. 2. Interval development of thickening of the distal third of the esophagus. This is nonspecific, and could be related to underlying reflux esophagitis. However, if there is any clinical concern for Barrett's metaplasia or esophageal neoplasia, further evaluation with nonemergent endoscopy could be considered. 3. Aortic atherosclerosis, in addition to left main coronary artery disease. Assessment for potential risk factor modification, dietary therapy or pharmacologic therapy may be warranted, if clinically Indicated. 4. Diffuse bronchial wall thickening with mild to moderate centrilobular and paraseptal emphysema; imaging findings suggestive of underlying COPD. 5. Additional incidental findings, as above.  #2  Adenocarcinoma of the Esophagus  Barrett's esophagus 4cms in the distal esophagus with low and high-grade dysplasia  01/02/19 Surgical pathology revealed adenocarcinoma of the esophagus   01/25/19 PET/CT revealed Distal esophageal primary, without hypermetabolic metastatic disease. 2. Chronic left iliac metastasis, as before. 3. Hypermetabolism within and superficial to the right gluteal musculature is most likely related to trauma and/or injection sites. 4. Aortic atherosclerosis, coronary artery atherosclerosis and emphysema.  S/p concurrent Carboplatin and Taxol weekly with RT of 45 Gy in 25 fractions and 5.4 Gy boost, completed between 02/04/19 and 03/27/19  07/03/2019 PET skull base to thigh revealed "1. Interval response to therapy. Significant reduction in FDG uptake associated with distal esophageal mass. SUV max currently 2.61 versus 16.97 previously. 2.  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 Unchanged 3. Aortic Atherosclerosis (ICD10-I70.0) and Emphysema (ICD10-J43.9). Coronary artery calcifications."  07/15/2020 CT C/A/P ([id number]) ([id number]) revealed "1. No evidence of new or progressive metastatic disease in the chest, abdomen or pelvis."  #3 diarrhea-  now resolved was previously. 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 She was previously on on Lialda, budesonide,probiotics and lomotil but not currently taking any of these. Plan -Continue Sandostatin every 4 weeks   #4 h/o DVT and PE  -continue on Xarelto - no issues with bleeding   #5 history of COPD management per primary care physician  #6 severe shingles of the forehead with postherpetic neuralgia  PLAN:  -Discussed lab results on 01/20/23  with patient. CBC normal, showed WBC of 10.5 K, hemoglobin of 12.1, and platelets of 326 K.  -CMP stable. -Has no symptoms suggestive of esophageal cancer or lung cancer progression at this time. - Will switch Lexapro 20 MG  to Cymbalta - Will increase Lyrica dose -recommend patient to receive shingles vaccination  FOLLOW UP:  Plz schedule monthly sandostatin and Xgeva x 6 RTC with Dr Irene Limbo with labs in 3 months   The total time spent  in the appointment was 21 minutes* .  All of the patient's questions were answered with apparent satisfaction. The patient knows to call the clinic with any problems, questions or concerns.   Sullivan Lone MD MS AAHIVMS [facility] Hematology/Oncology Physician [facility]  .*Total Encounter Time as defined by the Centers for Medicare and Medicaid Services includes, in addition to the face-to-face time of a patient visit (documented in the note above) non-face-to-face time: obtaining and reviewing outside history, ordering and reviewing medications,  tests or procedures, care coordination (communications with other health care professionals or caregivers) and documentation in the medical record.   I,Mitra Faeizi,acting as a Education administrator for Sullivan Lone, MD.,have documented all relevant documentation on the behalf of Sullivan Lone, MD,as directed by  Sullivan Lone, MD while in the presence of Sullivan Lone, MD.  .I have reviewed the above documentation for accuracy and completeness, and I agree with the above. [name] MD

## 2023-01-20 NOTE — Progress Notes (Signed)
Per Dr. Irene Limbo, okay to give [facility] today. Calcium corrected at 8.748 per pharmacist.

## 2023-01-22 ENCOUNTER — Other Ambulatory Visit: Payer: Self-pay | Admitting: Hematology

## 2023-01-23 ENCOUNTER — Other Ambulatory Visit: Payer: Self-pay

## 2023-01-23 ENCOUNTER — Telehealth: Payer: Self-pay | Admitting: Hematology

## 2023-01-23 NOTE — Telephone Encounter (Signed)
Called patient per 2/16 los notes to schedule f/u. Patient scheduled and notified. Forwarding patient concerns to RN.

## 2023-01-24 DIAGNOSIS — H18891 Other specified disorders of cornea, right eye: Secondary | ICD-10-CM | POA: Diagnosis not present

## 2023-01-26 ENCOUNTER — Encounter: Payer: Self-pay | Admitting: Hematology

## 2023-01-26 MED ORDER — DULOXETINE HCL 30 MG PO CPEP: 30.0000 mg | ORAL_CAPSULE | Freq: Every day | ORAL | 3 refills | Status: DC

## 2023-01-31 DIAGNOSIS — H1830 Unspecified corneal membrane change: Secondary | ICD-10-CM | POA: Diagnosis not present

## 2023-02-02 DIAGNOSIS — M6281 Muscle weakness (generalized): Secondary | ICD-10-CM | POA: Diagnosis not present

## 2023-02-07 DIAGNOSIS — H18891 Other specified disorders of cornea, right eye: Secondary | ICD-10-CM | POA: Diagnosis not present

## 2023-02-08 DIAGNOSIS — H02211 Cicatricial lagophthalmos right upper eyelid: Secondary | ICD-10-CM | POA: Diagnosis not present

## 2023-02-08 DIAGNOSIS — H02533 Eyelid retraction right eye, unspecified eyelid: Secondary | ICD-10-CM | POA: Diagnosis not present

## 2023-02-08 DIAGNOSIS — H02213 Cicatricial lagophthalmos right eye, unspecified eyelid: Secondary | ICD-10-CM | POA: Diagnosis not present

## 2023-02-13 ENCOUNTER — Encounter: Payer: Self-pay | Admitting: Family Medicine

## 2023-02-13 ENCOUNTER — Ambulatory Visit (INDEPENDENT_AMBULATORY_CARE_PROVIDER_SITE_OTHER): Payer: Medicare Other | Admitting: Family Medicine

## 2023-02-13 VITALS — BP 169/85 | HR 111 | Resp 18 | Ht 68.0 in | Wt 138.0 lb

## 2023-02-13 DIAGNOSIS — F411 Generalized anxiety disorder: Secondary | ICD-10-CM

## 2023-02-13 DIAGNOSIS — B0230 Zoster ocular disease, unspecified: Secondary | ICD-10-CM

## 2023-02-13 DIAGNOSIS — R300 Dysuria: Secondary | ICD-10-CM

## 2023-02-13 DIAGNOSIS — N3000 Acute cystitis without hematuria: Secondary | ICD-10-CM | POA: Diagnosis not present

## 2023-02-13 LAB — POCT URINALYSIS DIPSTICK
Bilirubin, UA: NEGATIVE
Blood, UA: NEGATIVE
Glucose, UA: NEGATIVE
Ketones, UA: NEGATIVE
Nitrite, UA: NEGATIVE
Protein, UA: POSITIVE — AB
Spec Grav, UA: >=1.03 — AB (ref 1.010–1.025)
Urobilinogen, UA: 0.2 E.U./dL
pH, UA: 5.5 (ref 5.0–8.0)

## 2023-02-13 MED ORDER — SULFAMETHOXAZOLE-TRIMETHOPRIM 800-160 MG PO TABS: 1.0000 | ORAL_TABLET | Freq: Two times a day (BID) | ORAL | 0 refills | Status: AC

## 2023-02-13 MED ORDER — GABAPENTIN 300 MG PO CAPS: 300.0000 mg | ORAL_CAPSULE | Freq: Three times a day (TID) | ORAL | 3 refills | Status: DC

## 2023-02-13 NOTE — Patient Instructions (Signed)
Take ONE capsule up to 3 times daily

## 2023-02-13 NOTE — Progress Notes (Signed)
Acute Office Visit  Subjective:     Patient ID: Cindy Byrd, female    DOB: December 29, 1940, 82 y.o.   MRN: [id number]  Chief Complaint  Patient presents with   Dysuria   Eye Pain    Right     HPI Patient is in today for possible UTI and right eye pain.  Patient previously had a UTI in October with the only real symptom being when she was falling a lot.  She was hospitalized and treated for it and symptoms completely resolved.  In the last week though, she has fallen a few times and remembered the previous episode.  She became concerned for UTI and wanted to get a urinalysis done today.  Additionally, she does see Dr. Gershon Crane for ongoing eye issues secondary to herpes zoster infection.  She had eye surgery last week and is recovering currently.  She has been out of her pregabalin that she was previously taking for quite some time.  Her caregiver has her own prescription for gabapentin, and the patient has been using 300 mg 3 times daily for relief of pain.  She is still taking her Cymbalta, trazodone, and Wellbutrin.  Her anxiety has been increased with the pain and recovery currently which she hopes will improve over time.  Review of Systems  Constitutional:  Negative for chills, fever and malaise/fatigue.  Eyes:  Positive for pain.  Respiratory:  Negative for shortness of breath.   Cardiovascular:  Negative for chest pain and palpitations.  Gastrointestinal:  Negative for constipation, diarrhea, nausea and vomiting.  Genitourinary:  Negative for dysuria, flank pain, frequency, hematuria and urgency.  Musculoskeletal:  Negative for back pain and myalgias.  Neurological:  Negative for dizziness and headaches.       Left eye pain  Psychiatric/Behavioral:  The patient is nervous/anxious.         Objective:    BP (!) 169/85 (BP Location: Left Arm, Patient Position: Sitting, Cuff Size: Normal)   Pulse (!) 111   Resp 18   Ht '5\' 8"'$  (1.727 m)   Wt 138 lb (62.6 kg)   SpO2 94%   BMI  20.98 kg/m   Physical Exam Constitutional:      General: She is not in acute distress.    Appearance: Normal appearance.  HENT:     Head: Normocephalic and atraumatic.  Eyes:     Comments: Bandage covering left eye from surgery.  Visible stitches over left eyebrow, dried blood on surrounding area but no apparent erythema or swelling.  Cardiovascular:     Rate and Rhythm: Normal rate and regular rhythm.     Heart sounds: Normal heart sounds. No murmur heard.    No friction rub. No gallop.  Pulmonary:     Effort: Pulmonary effort is normal. No respiratory distress.     Breath sounds: No wheezing, rhonchi or rales.  Musculoskeletal:     Cervical back: Normal range of motion.  Skin:    General: Skin is warm and dry.  Neurological:     General: No focal deficit present.     Mental Status: She is alert and oriented to person, place, and time. Mental status is at baseline.  Psychiatric:        Mood and Affect: Mood normal.        Thought Content: Thought content normal.        Judgment: Judgment normal.    Results for orders placed or performed in visit on 02/13/23  POCT Urinalysis  Dipstick  Result Value Ref Range   Color, UA Dark Yellow    Clarity, UA Slightly Cloudy    Glucose, UA Negative Negative   Bilirubin, UA Negative    Ketones, UA Negative    Spec Grav, UA >=1.030 (A) 1.010 - 1.025   Blood, UA Negative    pH, UA 5.5 5.0 - 8.0   Protein, UA Positive (A) Negative   Urobilinogen, UA 0.2 0.2 or 1.0 E.U./dL   Nitrite, UA Negative    Leukocytes, UA Trace (A) Negative   Appearance     Odor          Assessment & Plan:  Dysuria -     POCT urinalysis dipstick -     Urine Culture  GAD (generalized anxiety disorder)  Herpes zoster ophthalmicus of right eye -     Gabapentin; Take 1 capsule (300 mg total) by mouth 3 (three) times daily.  Dispense: 90 capsule; Refill: 3  Acute cystitis without hematuria -     Sulfamethoxazole-Trimethoprim; Take 1 tablet by mouth 2  (two) times daily for 3 days.  Dispense: 6 tablet; Refill: 0  Urinalysis positive for leukocytes, will treat with a course of Bactrim. It is reasonable to treat her neuropathic pain at this time with gabapentin.  She has been tolerating 300 mg 3 times a day.  We will restart at that dose at this time, and at her follow-up in 4 weeks we will reevaluate the dosage and schedule.  We did discuss potentially needing to get pain management involved since it is very possible that this pain will be chronic due to the herpes zoster ophthalmicus, but she does have a barrier with transportation and getting there.  We will try the gabapentin, and if we need to revisit the topic of pain management and to find a way around those barriers then we can.  Patient verbalized understanding and is agreeable to this plan.  Return in about 4 weeks (around 03/13/2023) for follow-up on pain managment.  I spent 30 minutes on the day of the encounter to include pre-visit record review, face-to-face time with the patient and post visit ordering of test.  Velva Harman, PA

## 2023-02-16 LAB — URINE CULTURE

## 2023-02-17 ENCOUNTER — Other Ambulatory Visit: Payer: Self-pay

## 2023-02-17 ENCOUNTER — Inpatient Hospital Stay: Payer: Medicare Other | Attending: Hematology

## 2023-02-17 VITALS — BP 115/62 | HR 91 | Temp 98.2°F | Resp 15

## 2023-02-17 DIAGNOSIS — K529 Noninfective gastroenteritis and colitis, unspecified: Secondary | ICD-10-CM

## 2023-02-17 DIAGNOSIS — C155 Malignant neoplasm of lower third of esophagus: Secondary | ICD-10-CM

## 2023-02-17 DIAGNOSIS — C3490 Malignant neoplasm of unspecified part of unspecified bronchus or lung: Secondary | ICD-10-CM | POA: Diagnosis not present

## 2023-02-17 DIAGNOSIS — C7951 Secondary malignant neoplasm of bone: Secondary | ICD-10-CM | POA: Insufficient documentation

## 2023-02-17 DIAGNOSIS — Z7189 Other specified counseling: Secondary | ICD-10-CM

## 2023-02-17 DIAGNOSIS — R197 Diarrhea, unspecified: Secondary | ICD-10-CM

## 2023-02-17 DIAGNOSIS — K521 Toxic gastroenteritis and colitis: Secondary | ICD-10-CM | POA: Insufficient documentation

## 2023-02-17 DIAGNOSIS — Z95828 Presence of other vascular implants and grafts: Secondary | ICD-10-CM

## 2023-02-17 MED ORDER — OCTREOTIDE ACETATE 30 MG IM KIT
30.0000 mg | PACK | Freq: Once | INTRAMUSCULAR | Status: AC
  Administered 2023-02-17: 30 mg via INTRAMUSCULAR
  Filled 2023-02-17: qty 1

## 2023-02-17 NOTE — Progress Notes (Signed)
Per Dr. Rico Junker Q 3 months

## 2023-03-04 DIAGNOSIS — M6281 Muscle weakness (generalized): Secondary | ICD-10-CM | POA: Diagnosis not present

## 2023-03-06 ENCOUNTER — Telehealth: Payer: Self-pay | Admitting: *Deleted

## 2023-03-06 DIAGNOSIS — R300 Dysuria: Secondary | ICD-10-CM

## 2023-03-06 MED ORDER — SULFAMETHOXAZOLE-TRIMETHOPRIM 800-160 MG PO TABS: 1.0000 | ORAL_TABLET | Freq: Two times a day (BID) | ORAL | 0 refills | Status: DC

## 2023-03-06 NOTE — Telephone Encounter (Signed)
Pt calling stating that she was seen and treated for UTI and is still having some symptoms.  Asked provider and she is going to send in a longer course of antibiotic. Routing to PCP.

## 2023-03-06 NOTE — Telephone Encounter (Signed)
Prescription sent to [facility]

## 2023-03-14 ENCOUNTER — Ambulatory Visit (INDEPENDENT_AMBULATORY_CARE_PROVIDER_SITE_OTHER): Payer: Medicare Other | Admitting: Family Medicine

## 2023-03-14 ENCOUNTER — Encounter: Payer: Self-pay | Admitting: Family Medicine

## 2023-03-14 VITALS — BP 148/73 | HR 91 | Resp 18 | Ht 68.0 in | Wt 138.0 lb

## 2023-03-14 DIAGNOSIS — B0230 Zoster ocular disease, unspecified: Secondary | ICD-10-CM

## 2023-03-14 DIAGNOSIS — J432 Centrilobular emphysema: Secondary | ICD-10-CM

## 2023-03-14 MED ORDER — GABAPENTIN 600 MG PO TABS: 600.0000 mg | ORAL_TABLET | Freq: Three times a day (TID) | ORAL | 0 refills | Status: DC

## 2023-03-14 MED ORDER — BREZTRI AEROSPHERE 160-9-4.8 MCG/ACT IN AERO: 2.0000 | INHALATION_SPRAY | Freq: Two times a day (BID) | RESPIRATORY_TRACT | 1 refills | Status: DC

## 2023-03-14 MED ORDER — ALBUTEROL SULFATE HFA 108 (90 BASE) MCG/ACT IN AERS: 2.0000 | INHALATION_SPRAY | Freq: Four times a day (QID) | RESPIRATORY_TRACT | 11 refills | Status: DC | PRN

## 2023-03-14 NOTE — Assessment & Plan Note (Signed)
Sent refill for albuterol rescue inhaler.  Trelegy was too expensive, so sending prescription for [facility] treatment therapy.  If that is also not affordable, then we may need to opt for 2 separate inhalers to make sure that we are getting a LAMA + LABA + ICS.

## 2023-03-14 NOTE — Assessment & Plan Note (Signed)
Gabapentin 300 mg 3 times a day was helpful but there is still room for improvement.  Increasing gabapentin.  Take gabapentin 600 mg 3 times a day for postherpetic neuralgia.  We discussed that this has been the maximum dose to show efficacy, but it would be safe to increase if needed in the future.  We are optimistic that this will be the perfect dose for her.
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
## 2023-03-14 NOTE — Patient Instructions (Signed)
Increase your gabapentin, the next step up is 600 mg 3 times a day.  I will see you in about a month to make sure that this dose is adequate for controlling the pain.  I hope that things do get better for you because you deserve better.  Be kind to yourself, give yourself grace, ask for help from people that care about you when you need it, and take it one day at a time.  I know I will limit you a couple times, but I am confident that you are strong enough to persevere and get through this!

## 2023-03-16 DIAGNOSIS — H02211 Cicatricial lagophthalmos right upper eyelid: Secondary | ICD-10-CM | POA: Diagnosis not present

## 2023-03-16 DIAGNOSIS — H02531 Eyelid retraction right upper eyelid: Secondary | ICD-10-CM | POA: Diagnosis not present

## 2023-03-16 DIAGNOSIS — H02219 Cicatricial lagophthalmos unspecified eye, unspecified eyelid: Secondary | ICD-10-CM | POA: Diagnosis not present

## 2023-03-17 ENCOUNTER — Other Ambulatory Visit: Payer: Self-pay

## 2023-03-17 ENCOUNTER — Inpatient Hospital Stay: Payer: Medicare Other | Attending: Hematology

## 2023-03-17 VITALS — BP 115/56 | HR 77 | Temp 98.2°F | Resp 18

## 2023-03-17 DIAGNOSIS — C3490 Malignant neoplasm of unspecified part of unspecified bronchus or lung: Secondary | ICD-10-CM | POA: Insufficient documentation

## 2023-03-17 DIAGNOSIS — Z95828 Presence of other vascular implants and grafts: Secondary | ICD-10-CM

## 2023-03-17 DIAGNOSIS — C7951 Secondary malignant neoplasm of bone: Secondary | ICD-10-CM | POA: Insufficient documentation

## 2023-03-17 DIAGNOSIS — R197 Diarrhea, unspecified: Secondary | ICD-10-CM

## 2023-03-17 DIAGNOSIS — K521 Toxic gastroenteritis and colitis: Secondary | ICD-10-CM | POA: Diagnosis not present

## 2023-03-17 DIAGNOSIS — K529 Noninfective gastroenteritis and colitis, unspecified: Secondary | ICD-10-CM

## 2023-03-17 DIAGNOSIS — Z7189 Other specified counseling: Secondary | ICD-10-CM

## 2023-03-17 DIAGNOSIS — C155 Malignant neoplasm of lower third of esophagus: Secondary | ICD-10-CM

## 2023-03-17 MED ORDER — OCTREOTIDE ACETATE 30 MG IM KIT
30.0000 mg | PACK | Freq: Once | INTRAMUSCULAR | Status: AC
  Administered 2023-03-17: 30 mg via INTRAMUSCULAR
  Filled 2023-03-17: qty 1

## 2023-03-23 ENCOUNTER — Telehealth: Payer: Self-pay

## 2023-03-23 DIAGNOSIS — H18891 Other specified disorders of cornea, right eye: Secondary | ICD-10-CM | POA: Diagnosis not present

## 2023-03-23 NOTE — Telephone Encounter (Signed)
Contacted Cindy Byrd to schedule their annual wellness visit. Appointment made for 03/28/23.  Agnes Lawrence, CMA (AAMA)  [facility] [phone]

## 2023-03-24 ENCOUNTER — Other Ambulatory Visit: Payer: Self-pay | Admitting: Hematology

## 2023-03-28 ENCOUNTER — Ambulatory Visit (INDEPENDENT_AMBULATORY_CARE_PROVIDER_SITE_OTHER): Payer: Medicare Other

## 2023-03-28 VITALS — Ht 68.0 in | Wt 138.0 lb

## 2023-03-28 DIAGNOSIS — Z0000 Encounter for general adult medical examination without abnormal findings: Secondary | ICD-10-CM | POA: Diagnosis not present

## 2023-03-28 NOTE — Patient Instructions (Addendum)
[name] , Thank you for taking time to come for your Medicare Wellness Visit. I appreciate your ongoing commitment to your health goals. Please review the following plan we discussed and let me know if I can assist you in the future.   These are the goals we discussed:  Goals       No current goals (pt-stated)      Get out in my yard.        This is a list of the screening recommended for you and due dates:  Health Maintenance  Topic Date Due   DTaP/Tdap/Td vaccine (1 - Tdap) Never done   COVID-19 Vaccine (3 - Pfizer risk series) 04/13/2023*   Zoster (Shingles) Vaccine (2 of 2) 06/27/2023*   Pneumonia Vaccine (1 of 2 - PCV) 03/27/2024*   Medicare Annual Wellness Visit  03/27/2024   DEXA scan (bone density measurement)  Completed   HPV Vaccine  Aged Out  *Topic was postponed. The date shown is not the original due date.   Opioid Pain Medicine Management Opioid pain medicines are strong medicines that are used to treat bad or very bad pain. When you take them for a short time, they can help you: Sleep better. Do better in physical therapy. Feel better during the first few days after you get hurt. Recover from surgery. Only take these medicines if a doctor says that you can. You should only take them for a short time. This is because opioids can be very addictive. This means that they are hard to stop taking. The longer you take opioids, the harder it may be to stop taking them. What are the risks? Opioids can cause problems (side effects). Taking them for more than 3 days raises your chance of problems, such as: Trouble pooping (constipation). Feeling sick to your stomach (nausea). Vomiting. Feeling very sleepy. Confusion. Not being able to stop taking the medicine. Breathing problems. Taking opioids for a long time can make it hard for you to do daily tasks. It can also put you at risk for: Car accidents. Depression. Suicide. Heart attack. Taking too much of the  medicine (overdose). This can lead to death. What is a pain treatment plan? A pain treatment plan is a plan made by you and your doctor. Work with your doctor to make a plan for treating your pain. To help you do this: Talk about the goals of your treatment, including: How much pain you might expect to have. How you will manage the pain. Talk about the risks and benefits of taking these medicines for your condition. Remember that a good treatment plan uses more than one approach and lowers the risks of side effects. Tell your doctor about the amount of medicines you take and about any drug or alcohol use. Get your pain medicine prescriptions from only one doctor. Pain can be managed with other treatments. Work with your doctor to find other ways to help your pain, such as: Physical therapy or doing gentle exercises. Counseling. Eating healthy foods. Massage. Meditation. Other pain medicines. How to use opioid pain medicine safely Taking medicine Take your pain medicine exactly as told by your doctor. Take it only when you need it. If your pain is not too bad, you may take less medicine if your doctor allows. If you have no pain, do not take the medicine unless your doctor tells you to take it. If your pain is very bad, do not take more medicine than your doctor told you to  take. Call your doctor to know what to do. Write down the times when you take your pain medicine. Look at the times before you take your next dose. Take other over-the-counter or prescription medicines only as told by your doctor. Keeping yourself and others safe  While you are taking opioids: Do not drive, use machines, or power tools. Do not sign important papers (legal documents). Do not drink alcohol. Do not take sleeping pills. Do not take care of children by yourself. Do not do activities where you need to climb or be in high places, like working on a ladder. Do not go to a lake, river, ocean, swimming pool,  or hot tub. Keep your opioids locked up or in a place where children cannot reach them. Do not share your pain medicine with anyone. Stopping your use of opioids If you have been taking opioids for more than a few weeks, you may need to slowly decrease (taper) how much you take until you stop taking them. Doing this can lower your chance of having symptoms.  Symptoms that come from suddenly stopping the use of opioids include: Pain and cramping in your belly (abdomen). Feeling sick to your stomach (nausea).z Sweating. Feeling very sleepy. Feeling restless. Shaking you cannot control (tremors). Cravings for the medicine. Do not try to stop taking them by yourself. Work with your doctor to stop. Your doctor will help you take less until you are not taking the medicine at all. Getting rid of unused pills Do not save any pills that you did not use. Get rid of the pills by: Taking them to a take-back program in your area. Bringing them to a pharmacy that receives unused pills. Flushing them down the toilet. Check the label or package insert of your medicine to see whether this is safe to do. Throwing them in the trash. Check the label or package insert of your medicine to see whether this is safe to do. If it is safe to throw them out: Take the pills out of their container. Put the pills into a container you can seal. Mix the pills with used coffee grounds, food scraps, dirt, or cat litter. Put this in the trash. Follow these instructions at home: Activity Do exercises as told by your doctor. Avoid doing things that make your pain worse. Return to your normal activities as told by your doctor. Ask your doctor what activities are safe for you. General instructions You may need to take these actions to prevent or treat constipation: Drink enough fluid to keep your pee (urine) pale yellow. Take over-the-counter or prescription medicines. Eat foods that are high in fiber. These include beans,  whole grains, and fresh fruits and vegetables. Limit foods that are high in fat and sugar. These include fried or sweet foods. Keep all follow-up visits. Where to find support If you have been taking opioids for a long time, get help from a local support group or counselor. Ask your doctor about this. Where to find more information Centers for Disease Control and Prevention (CDC): FootballExhibition.com.br [place] Food and Drug Administration (FDA): PumpkinSearch.com.ee Get help right away if: You may have taken too much of an opioid (overdosed). Common symptoms of an overdose: Your breathing is slower or more shallow than normal. You have a very slow heartbeat. Your speech is not normal. You vomit or you feel as if you may vomit. The black centers of your eyes (pupils) are smaller than normal. You have other potential symptoms: You feel very  confused. You faint. You are very sleepy. You have cold skin. You have blue lips or fingernails. You have thoughts of harming yourself or harming others. These symptoms may be an emergency. Get help right away. Call your local emergency services (911 in the [place] Do not wait to see if the symptoms will go away. Do not drive yourself to the hospital. Get help right away if you feel like you may hurt yourself or others, or have thoughts about taking your own life. Go to your nearest emergency room or: Call your local emergency services (911 in the [place] Call the [facility] at (510)365-4420. Call a suicide crisis helpline, such as the National Suicide Prevention Lifeline at 669-385-8926 or 988 in the [place] This is open 24 hours a day. Text the Crisis Text Line at (541)519-9877. Summary Opioid are strong medicines that are used to treat bad or very bad pain. A pain treatment plan is a plan made by you and your doctor. Work with your doctor to make a plan for treating your pain. If you think that you or someone else may have taken too much of an opioid, get  help right away. This information is not intended to replace advice given to you by your health care provider. Make sure you discuss any questions you have with your health care provider. Document Revised: 06/16/2021 Document Reviewed: 03/03/2021 Elsevier Patient Education  2023 Elsevier Inc.  Advanced directives: In Chart  Conditions/risks identified: None  Next appointment: Follow up in one year for your annual wellness visit    Preventive Care 65 Years and Older, Female Preventive care refers to lifestyle choices and visits with your health care provider that can promote health and wellness. What does preventive care include? A yearly physical exam. This is also called an annual well check. Dental exams once or twice a year. Routine eye exams. Ask your health care provider how often you should have your eyes checked. Personal lifestyle choices, including: Daily care of your teeth and gums. Regular physical activity. Eating a healthy diet. Avoiding tobacco and drug use. Limiting alcohol use. Practicing safe sex. Taking low-dose aspirin every day. Taking vitamin and mineral supplements as recommended by your health care provider. What happens during an annual well check? The services and screenings done by your health care provider during your annual well check will depend on your age, overall health, lifestyle risk factors, and family history of disease. Counseling  Your health care provider may ask you questions about your: Alcohol use. Tobacco use. Drug use. Emotional well-being. Home and relationship well-being. Sexual activity. Eating habits. History of falls. Memory and ability to understand (cognition). Work and work Astronomer. Reproductive health. Screening  You may have the following tests or measurements: Height, weight, and BMI. Blood pressure. Lipid and cholesterol levels. These may be checked every 5 years, or more frequently if you are over 62 years  old. Skin check. Lung cancer screening. You may have this screening every year starting at age 69 if you have a 30-pack-year history of smoking and currently smoke or have quit within the past 15 years. Fecal occult blood test (FOBT) of the stool. You may have this test every year starting at age 36. Flexible sigmoidoscopy or colonoscopy. You may have a sigmoidoscopy every 5 years or a colonoscopy every 10 years starting at age 39. Hepatitis C blood test. Hepatitis B blood test. Sexually transmitted disease (STD) testing. Diabetes screening. This is done by checking your blood sugar (glucose)  after you have not eaten for a while (fasting). You may have this done every 1-3 years. Bone density scan. This is done to screen for osteoporosis. You may have this done starting at age 90. Mammogram. This may be done every 1-2 years. Talk to your health care provider about how often you should have regular mammograms. Talk with your health care provider about your test results, treatment options, and if necessary, the need for more tests. Vaccines  Your health care provider may recommend certain vaccines, such as: Influenza vaccine. This is recommended every year. Tetanus, diphtheria, and acellular pertussis (Tdap, Td) vaccine. You may need a Td booster every 10 years. Zoster vaccine. You may need this after age 70. Pneumococcal 13-valent conjugate (PCV13) vaccine. One dose is recommended after age 67. Pneumococcal polysaccharide (PPSV23) vaccine. One dose is recommended after age 76. Talk to your health care provider about which screenings and vaccines you need and how often you need them. This information is not intended to replace advice given to you by your health care provider. Make sure you discuss any questions you have with your health care provider. Document Released: 12/18/2015 Document Revised: 08/10/2016 Document Reviewed: 09/22/2015 Elsevier Interactive Patient Education  2017 [employer].  Fall Prevention in the Home Falls can cause injuries. They can happen to people of all ages. There are many things you can do to make your home safe and to help prevent falls. What can I do on the outside of my home? Regularly fix the edges of walkways and driveways and fix any cracks. Remove anything that might make you trip as you walk through a door, such as a raised step or threshold. Trim any bushes or trees on the path to your home. Use bright outdoor lighting. Clear any walking paths of anything that might make someone trip, such as rocks or tools. Regularly check to see if handrails are loose or broken. Make sure that both sides of any steps have handrails. Any raised decks and porches should have guardrails on the edges. Have any leaves, snow, or ice cleared regularly. Use sand or salt on walking paths during winter. Clean up any spills in your garage right away. This includes oil or grease spills. What can I do in the bathroom? Use night lights. Install grab bars by the toilet and in the tub and shower. Do not use towel bars as grab bars. Use non-skid mats or decals in the tub or shower. If you need to sit down in the shower, use a plastic, non-slip stool. Keep the floor dry. Clean up any water that spills on the floor as soon as it happens. Remove soap buildup in the tub or shower regularly. Attach bath mats securely with double-sided non-slip rug tape. Do not have throw rugs and other things on the floor that can make you trip. What can I do in the bedroom? Use night lights. Make sure that you have a light by your bed that is easy to reach. Do not use any sheets or blankets that are too big for your bed. They should not hang down onto the floor. Have a firm chair that has side arms. You can use this for support while you get dressed. Do not have throw rugs and other things on the floor that can make you trip. What can I do in the kitchen? Clean up any spills right  away. Avoid walking on wet floors. Keep items that you use a lot in easy-to-reach places. If  you need to reach something above you, use a strong step stool that has a grab bar. Keep electrical cords out of the way. Do not use floor polish or wax that makes floors slippery. If you must use wax, use non-skid floor wax. Do not have throw rugs and other things on the floor that can make you trip. What can I do with my stairs? Do not leave any items on the stairs. Make sure that there are handrails on both sides of the stairs and use them. Fix handrails that are broken or loose. Make sure that handrails are as long as the stairways. Check any carpeting to make sure that it is firmly attached to the stairs. Fix any carpet that is loose or worn. Avoid having throw rugs at the top or bottom of the stairs. If you do have throw rugs, attach them to the floor with carpet tape. Make sure that you have a light switch at the top of the stairs and the bottom of the stairs. If you do not have them, ask someone to add them for you. What else can I do to help prevent falls? Wear shoes that: Do not have high heels. Have rubber bottoms. Are comfortable and fit you well. Are closed at the toe. Do not wear sandals. If you use a stepladder: Make sure that it is fully opened. Do not climb a closed stepladder. Make sure that both sides of the stepladder are locked into place. Ask someone to hold it for you, if possible. Clearly mark and make sure that you can see: Any grab bars or handrails. First and last steps. Where the edge of each step is. Use tools that help you move around (mobility aids) if they are needed. These include: Canes. Walkers. Scooters. Crutches. Turn on the lights when you go into a dark area. Replace any light bulbs as soon as they burn out. Set up your furniture so you have a clear path. Avoid moving your furniture around. If any of your floors are uneven, fix them. If there are any  pets around you, be aware of where they are. Review your medicines with your doctor. Some medicines can make you feel dizzy. This can increase your chance of falling. Ask your doctor what other things that you can do to help prevent falls. This information is not intended to replace advice given to you by your health care provider. Make sure you discuss any questions you have with your health care provider. Document Released: 09/17/2009 Document Revised: 04/28/2016 Document Reviewed: 12/26/2014 Elsevier Interactive Patient Education  2017 [employer].

## 2023-03-28 NOTE — Progress Notes (Signed)
Subjective:   Cindy Byrd is a 82 y.o. female who presents for Medicare Annual (Subsequent) preventive examination.  Review of Systems    Virtual Visit via Telephone Note  I connected with  DECEMBER HEDTKE on 03/28/23 at 11:30 AM EDT by telephone and verified that I am speaking with the correct person using two identifiers.  Location: Patient: Home Provider: Office Persons participating in the virtual visit: patient/Nurse Health Advisor   I discussed the limitations, risks, security and privacy concerns of performing an evaluation and management service by telephone and the availability of in person appointments. The patient expressed understanding and agreed to proceed.  Interactive audio and video telecommunications were attempted between this nurse and patient, however failed, due to patient having technical difficulties OR patient did not have access to video capability.  We continued and completed visit with audio only.  Some vital signs may be absent or patient reported.   Tillie Rung, LPN  Cardiac Risk Factors include: advanced age (>51men, >69 women);hypertension     Objective:    Today's Vitals   03/28/23 1141 03/28/23 1142  Weight: 138 lb (62.6 kg)   Height: 5\' 8"  (1.727 m)   PainSc:  0-No pain   Body mass index is 20.98 kg/m.     03/28/2023   11:50 AM 09/05/2022    3:02 PM 06/09/2022    3:24 PM 05/12/2022    2:38 PM 08/21/2020    9:30 AM 02/05/2020    1:45 PM 10/16/2019    1:03 PM  Advanced Directives  Does Patient Have a Medical Advance Directive? Yes No Yes Yes Yes Yes Yes  Type of Estate agent of [place];Living will  Living will Living will Healthcare Power of [place];Living will Healthcare Power of [place];Living will Healthcare Power of [place];Living will;Out of facility DNR (pink MOST or yellow form)  Does patient want to make changes to medical advance directive? No - Patient declined     No - Patient declined No -  Patient declined  Copy of Healthcare Power of Attorney in Chart? Yes - validated most recent copy scanned in chart (See row information)    No - copy requested No - copy requested Yes - validated most recent copy scanned in chart (See row information)  Would patient like information on creating a medical advance directive?  Yes (Inpatient - patient requests chaplain consult to create a medical advance directive)   No - Patient declined    Pre-existing out of facility DNR order (yellow form or pink MOST form)       Yellow form placed in chart (order not valid for inpatient use)    Current Medications (verified) Outpatient Encounter Medications as of 03/28/2023  Medication Sig   acetaminophen (TYLENOL) 325 MG tablet Take 2 tablets (650 mg total) by mouth every 6 (six) hours as needed for mild pain (or Fever >/= 101).   albuterol (VENTOLIN HFA) 108 (90 Base) MCG/ACT inhaler Inhale 2 puffs into the lungs every 6 (six) hours as needed for wheezing.   Biotin 5 MG CAPS Take 5 mg by mouth daily.   Budeson-Glycopyrrol-Formoterol (BREZTRI AEROSPHERE) 160-9-4.8 MCG/ACT AERO Inhale 2 puffs into the lungs 2 (two) times daily.   buPROPion (WELLBUTRIN XL) 150 MG 24 hr tablet TAKE 1 TABLET BY MOUTH EVERY DAY   Calcium Citrate-Vitamin D (CALCIUM + D PO) Take 1 tablet by mouth daily.   Cyanocobalamin (B-12) 2500 MCG TABS Take 2,500 mcg by mouth daily.   DULoxetine (  CYMBALTA) 30 MG capsule TAKE 1 CAPSULE BY MOUTH EVERY DAY   feeding supplement (ENSURE ENLIVE / ENSURE PLUS) LIQD Take 237 mLs by mouth 2 (two) times daily between meals.   fluconazole (DIFLUCAN) 200 MG tablet Take 200 mg by mouth daily.   gabapentin (NEURONTIN) 600 MG tablet Take 1 tablet (600 mg total) by mouth 3 (three) times daily.   HYDROcodone-acetaminophen (NORCO) 5-325 MG tablet Take 1 tablet by mouth every 6 (six) hours as needed for moderate pain.   omeprazole (PRILOSEC) 40 MG capsule TAKE 1 CAPSULE BY MOUTH EVERY DAY BEFORE BREAKFAST    ondansetron (ZOFRAN-ODT) 4 MG disintegrating tablet Take 1 tablet (4 mg total) by mouth every 8 (eight) hours as needed for nausea or vomiting.   sulfamethoxazole-trimethoprim (BACTRIM DS) 800-160 MG tablet Take 1 tablet by mouth 2 (two) times daily.   traZODone (DESYREL) 50 MG tablet Take 1 tablet (50 mg total) by mouth at bedtime as needed for sleep.   valACYclovir (VALTREX) 500 MG tablet TAKE 1 TABLET BY MOUTH TWICE A DAY   No facility-administered encounter medications on file as of 03/28/2023.    Allergies (verified) Penicillins, Remeron [mirtazapine], and Latex   History: Past Medical History:  Diagnosis Date   Barrett's esophagus    Bilateral pulmonary embolism 09/07/2016   09/02/16 bilateral pulmonary emboli in context of extensive bilateral lower extremity deep venous thromboses Assumed hypercoagulability due to non-small cell metastatic lung cancer Lifelong anticoagulation recommended   Bone neoplasm 06/24/2015   Cancer    metastatic poorly differentiated carcinoma. tumor left groin surgical removal with radiation tx.   Cataract    BILATERAL   Cigarette smoker two packs a day or less    Currently still smoking 2 PPD - Not interested in quitting at this time.   Colitis 2017   Colon polyps    hyperplastic, tubular adenomas, tubulovillous adenoma   Cough, persistent    hx. lung cancer ? primary-being evaluated, unsure of primary site.   Depression 06/24/2015   Diverticulosis    DVT of lower extremity, bilateral 09/07/2016   Emphysema of lung    Endometriosis    Hysterectomy with BSO at age 57 yrs   Esophageal adenocarcinoma 08/11/15   intramucosal   Gastritis    GERD (gastroesophageal reflux disease)    H/O: pneumonia    Hiatal hernia    Hyperlipidemia    Hypertension 06/24/2015   likely improved incidental to 40 lbs weight loss from her neoplasm. No Longer taking med for this as of 08-06-15   IBS (irritable bowel syndrome)    Pain    left hip-persistent"tumor of  bone"-radiation tx. 10.   Vitamin D deficiency disease    Past Surgical History:  Procedure Laterality Date   ABDOMINAL HYSTERECTOMY     BALLOON DILATION N/A 10/08/2019   Procedure: BALLOON DILATION;  Surgeon: Beverley Fiedler, MD;  Location: [facility];  Service: Gastroenterology;  Laterality: N/A;   BARTHOLIN GLAND CYST EXCISION  82 yo ago   Does not want if it was an infected cyst or tumor. Was soon as delivery   BIOPSY  01/02/2019   Procedure: BIOPSY;  Surgeon: Beverley Fiedler, MD;  Location: [facility];  Service: Gastroenterology;;   CATARACT EXTRACTION     COLONOSCOPY W/ POLYPECTOMY     multiple times - last done 09/2014 per patient.   ESOPHAGOGASTRODUODENOSCOPY (EGD) WITH PROPOFOL N/A 08/11/2015   Procedure: ESOPHAGOGASTRODUODENOSCOPY (EGD) WITH PROPOFOL;  Surgeon: Beverley Fiedler, MD;  Location: [facility];  Service: Gastroenterology;  Laterality: N/A;   ESOPHAGOGASTRODUODENOSCOPY (EGD) WITH PROPOFOL N/A 01/02/2019   Procedure: ESOPHAGOGASTRODUODENOSCOPY (EGD) WITH PROPOFOL;  Surgeon: Beverley Fiedler, MD;  Location: [facility];  Service: Gastroenterology;  Laterality: N/A;   ESOPHAGOGASTRODUODENOSCOPY (EGD) WITH PROPOFOL N/A 10/08/2019   Procedure: ESOPHAGOGASTRODUODENOSCOPY (EGD) WITH PROPOFOL;  Surgeon: Beverley Fiedler, MD;  Location: [facility];  Service: Gastroenterology;  Laterality: N/A;   FLEXIBLE SIGMOIDOSCOPY N/A 06/24/2017   Procedure: FLEXIBLE SIGMOIDOSCOPY;  Surgeon: Ruffin Frederick, MD;  Location: [facility];  Service: Gastroenterology;  Laterality: N/A;   GANGLION CYST EXCISION     KNEE ARTHROSCOPY  age about 7 yrs   TONSILLECTOMY     TOTAL ABDOMINAL HYSTERECTOMY W/ BILATERAL SALPINGOOPHORECTOMY  at age 56 yrs   For endometriosis   Family History  Problem Relation Age of Onset   Colon cancer Brother    Colon cancer Brother    Stroke Mother    Colon cancer Father    Emphysema Father        smoked   Breast cancer Daughter 29       ER/PR+ stage II   Social  History   Socioeconomic History   Marital status: Widowed    Spouse name: Not on file   Number of children: 2   Years of education: Not on file   Highest education level: Not on file  Occupational History   Not on file  Tobacco Use   Smoking status: Former    Packs/day: 1.00    Years: 60.00    Additional pack years: 0.00    Total pack years: 60.00    Types: Cigarettes    Quit date: 12/05/2014    Years since quitting: 8.3    Passive exposure: Never   Smokeless tobacco: Never  Vaping Use   Vaping Use: Never used  Substance and Sexual Activity   Alcohol use: No    Alcohol/week: 0.0 standard drinks of alcohol   Drug use: No   Sexual activity: Not Currently  Other Topics Concern   Not on file  Social History Narrative   Not on file   Social Determinants of Health   Financial Resource Strain: Low Risk  (03/28/2023)   Overall Financial Resource Strain (CARDIA)    Difficulty of Paying Living Expenses: Not hard at all  Food Insecurity: No Food Insecurity (03/28/2023)   Hunger Vital Sign    Worried About Running Out of Food in the Last Year: Never true    Ran Out of Food in the Last Year: Never true  Transportation Needs: No Transportation Needs (03/28/2023)   PRAPARE - Administrator, Civil Service (Medical): No    Lack of Transportation (Non-Medical): No  Physical Activity: Inactive (03/28/2023)   Exercise Vital Sign    Days of Exercise per Week: 0 days    Minutes of Exercise per Session: 0 min  Stress: No Stress Concern Present (03/28/2023)   [employer] of Occupational Health - Occupational Stress Questionnaire    Feeling of Stress : Not at all  Social Connections: Unknown (03/28/2023)   Social Connection and Isolation Panel [NHANES]    Frequency of Communication with Friends and Family: Patient declined    Frequency of Social Gatherings with Friends and Family: Patient declined    Attends Religious Services: Patient declined    Database administrator or  Organizations: Patient declined    Attends Banker Meetings: Patient declined    Marital Status: Widowed    Tobacco  Counseling Counseling given: Not Answered   Clinical Intake:  Pre-visit preparation completed: No  Pain : No/denies pain Pain Score: 0-No pain     BMI - recorded: 20.98 Nutritional Status: BMI of 19-24  Normal Nutritional Risks: None Diabetes: No  How often do you need to have someone help you when you read instructions, pamphlets, or other written materials from your doctor or pharmacy?: 1 - Never  Diabetic?  No  Interpreter Needed?: No  Information entered by :: Theresa Mulligan LPN   Activities of Daily Living    03/28/2023   11:48 AM 09/05/2022    3:10 PM  In your present state of health, do you have any difficulty performing the following activities:  Hearing? 0   Vision? 0   Difficulty concentrating or making decisions? 0   Walking or climbing stairs? 0   Dressing or bathing? 0   Doing errands, shopping? 0 1  Preparing Food and eating ? N   Using the Toilet? N   In the past six months, have you accidently leaked urine? N   Do you have problems with loss of bowel control? N   Managing your Medications? N   Managing your Finances? N   Housekeeping or managing your Housekeeping? N     Patient Care Team: Melida Quitter, PA as PCP - General (Family Medicine) Johney Maine, MD as Consulting Physician (Hematology and Oncology)  Indicate any recent Medical Services you may have received from other than [facility] providers in the past year (date may be approximate).     Assessment:   This is a routine wellness examination for [place].  Hearing/Vision screen Hearing Screening - Comments:: Denies hearing difficulties   Vision Screening - Comments:: Wears rx glasses - up to date with routine eye exams with  Dr Nile Riggs  Dietary issues and exercise activities discussed: Exercise limited by: None identified   Goals Addressed                This Visit's Progress     No current goals (pt-stated)        Get out in my yard.       Depression Screen    03/28/2023   11:47 AM 02/13/2023    3:53 PM 06/20/2022    9:59 AM 05/05/2022    4:27 PM 04/25/2022    2:00 PM 01/24/2022    1:49 PM 11/26/2021    8:39 AM  PHQ 2/9 Scores  PHQ - 2 Score 0 5 1 0 PHQ- 9 Score 0 Fall Risk    03/28/2023   11:49 AM 02/13/2023    3:53 PM 07/04/2022    3:52 PM 06/20/2022    9:59 AM 04/25/2022    1:51 PM  Fall Risk   Falls in the past year? 0 0  Number falls in past yr: 0 1 1 0 0  Injury with Fall? 0 0 0 0 0  Risk for fall due to : No Fall Risks Impaired vision Impaired balance/gait No Fall Risks History of fall(s)  Follow up Falls prevention discussed  Falls evaluation completed Falls evaluation completed Falls evaluation completed    FALL RISK PREVENTION PERTAINING TO THE HOME:  Any stairs in or around the home? Yes  If so, are there any without handrails? No  Home free of loose throw rugs in walkways, pet beds, electrical cords, etc? Yes  Adequate lighting in  your home to reduce risk of falls? Yes   ASSISTIVE DEVICES UTILIZED TO PREVENT FALLS:  Life alert? No  Use of a cane, walker or w/c? Yes  Grab bars in the bathroom? Yes  Shower chair or bench in shower? Yes  Elevated toilet seat or a handicapped toilet? Yes   TIMED UP AND GO:  Was the test performed? No . Audio Visit  Cognitive Function:        03/28/2023   11:50 AM 11/26/2021    8:30 AM  6CIT Screen  What Year? 0 points 0 points  What month? 0 points 0 points  What time? 0 points 0 points  Count back from 20 0 points 0 points  Months in reverse 2 points 0 points  Repeat phrase 4 points 0 points  Total Score 6 points 0 points    Immunizations Immunization History  Administered Date(s) Administered   Fluad Quad(high Dose 65+) 09/04/2019, 09/24/2020   PFIZER(Purple Top)SARS-COV-2 Vaccination 07/09/2020, 10/06/2020    PPD Test 09/13/2016   Zoster Recombinat (Shingrix) 01/23/2023    TDAP status: Due, Education has been provided regarding the importance of this vaccine. Advised may receive this vaccine at local pharmacy or Health Dept. Aware to provide a copy of the vaccination record if obtained from local pharmacy or Health Dept. Verbalized acceptance and understanding.    Pneumococcal vaccine status: Due, Education has been provided regarding the importance of this vaccine. Advised may receive this vaccine at local pharmacy or Health Dept. Aware to provide a copy of the vaccination record if obtained from local pharmacy or Health Dept. Verbalized acceptance and understanding.  Covid-19 vaccine status: Completed vaccines  Qualifies for Shingles Vaccine? Yes   Zostavax completed Yes   Shingrix Completed?: Yes  Screening Tests Health Maintenance  Topic Date Due   DTaP/Tdap/Td (1 - Tdap) Never done   COVID-19 Vaccine (3 - Pfizer risk series) 04/13/2023 (Originally 11/03/2020)   Zoster Vaccines- Shingrix (2 of 2) 06/27/2023 (Originally 03/20/2023)   Pneumonia Vaccine 27+ Years old (1 of 2 - PCV) 03/27/2024 (Originally 01/24/1947)   Medicare Annual Wellness (AWV)  03/27/2024   DEXA SCAN  Completed   HPV VACCINES  Aged Out    Health Maintenance  Health Maintenance Due  Topic Date Due   DTaP/Tdap/Td (1 - Tdap) Never done    Colorectal cancer screening: No longer required.   Mammogram status: No longer required due to Age.  Bone Density status: Completed 01/23/09. Results reflect: Bone density results: OSTEOPOROSIS. Repeat every   years.  Lung Cancer Screening: (Low Dose CT Chest recommended if Age 63-80 years, 30 pack-year currently smoking OR have quit w/in 15years.)  qualify.     Additional Screening:  Hepatitis C Screening: does not qualify; Completed   Vision Screening: Recommended annual ophthalmology exams for early detection of glaucoma and other disorders of the eye. Is the patient  up to date with their annual eye exam?  Yes  Who is the provider or what is the name of the office in which the patient attends annual eye exams? Dr Nile Riggs If pt is not established with a provider, would they like to be referred to a provider to establish care? No .   Dental Screening: Recommended annual dental exams for proper oral hygiene  Community Resource Referral / Chronic Care Management:  CRR required this visit?  No   CCM required this visit?  No      Plan:     I have personally reviewed and noted  the following in the patient's chart:   Medical and social history Use of alcohol, tobacco or illicit drugs  Current medications and supplements including opioid prescriptions. Patient is currently taking opioid prescriptions. Information provided to patient regarding non-opioid alternatives. Patient advised to discuss non-opioid treatment plan with their provider. Functional ability and status Nutritional status Physical activity Advanced directives List of other physicians Hospitalizations, surgeries, and ER visits in previous 12 months Vitals Screenings to include cognitive, depression, and falls Referrals and appointments  In addition, I have reviewed and discussed with patient certain preventive protocols, quality metrics, and best practice recommendations. A written personalized care plan for preventive services as well as general preventive health recommendations were provided to patient.     Tillie Rung, LPN   [id number]/61/0960   Nurse Notes: None

## 2023-03-30 DIAGNOSIS — H16001 Unspecified corneal ulcer, right eye: Secondary | ICD-10-CM | POA: Diagnosis not present

## 2023-04-03 ENCOUNTER — Other Ambulatory Visit: Payer: Self-pay

## 2023-04-03 DIAGNOSIS — C3490 Malignant neoplasm of unspecified part of unspecified bronchus or lung: Secondary | ICD-10-CM

## 2023-04-04 DIAGNOSIS — M6281 Muscle weakness (generalized): Secondary | ICD-10-CM | POA: Diagnosis not present

## 2023-04-11 DIAGNOSIS — H02211 Cicatricial lagophthalmos right upper eyelid: Secondary | ICD-10-CM | POA: Diagnosis not present

## 2023-04-11 DIAGNOSIS — H18891 Other specified disorders of cornea, right eye: Secondary | ICD-10-CM | POA: Diagnosis not present

## 2023-04-12 DIAGNOSIS — H18891 Other specified disorders of cornea, right eye: Secondary | ICD-10-CM | POA: Diagnosis not present

## 2023-04-12 DIAGNOSIS — H02211 Cicatricial lagophthalmos right upper eyelid: Secondary | ICD-10-CM | POA: Diagnosis not present

## 2023-04-14 ENCOUNTER — Other Ambulatory Visit: Payer: Self-pay

## 2023-04-14 ENCOUNTER — Inpatient Hospital Stay: Payer: Medicare Other | Attending: Hematology | Admitting: Hematology

## 2023-04-14 ENCOUNTER — Inpatient Hospital Stay: Payer: Medicare Other

## 2023-04-14 ENCOUNTER — Ambulatory Visit: Payer: Medicare Other

## 2023-04-14 VITALS — BP 160/86 | HR 84 | Temp 97.9°F | Resp 17 | Ht 68.0 in | Wt 140.6 lb

## 2023-04-14 DIAGNOSIS — C3490 Malignant neoplasm of unspecified part of unspecified bronchus or lung: Secondary | ICD-10-CM | POA: Insufficient documentation

## 2023-04-14 DIAGNOSIS — K521 Toxic gastroenteritis and colitis: Secondary | ICD-10-CM | POA: Insufficient documentation

## 2023-04-14 DIAGNOSIS — C155 Malignant neoplasm of lower third of esophagus: Secondary | ICD-10-CM

## 2023-04-14 DIAGNOSIS — Z86718 Personal history of other venous thrombosis and embolism: Secondary | ICD-10-CM | POA: Diagnosis not present

## 2023-04-14 DIAGNOSIS — Z8501 Personal history of malignant neoplasm of esophagus: Secondary | ICD-10-CM | POA: Insufficient documentation

## 2023-04-14 DIAGNOSIS — J449 Chronic obstructive pulmonary disease, unspecified: Secondary | ICD-10-CM | POA: Diagnosis not present

## 2023-04-14 DIAGNOSIS — K529 Noninfective gastroenteritis and colitis, unspecified: Secondary | ICD-10-CM

## 2023-04-14 DIAGNOSIS — Z7189 Other specified counseling: Secondary | ICD-10-CM

## 2023-04-14 DIAGNOSIS — R0602 Shortness of breath: Secondary | ICD-10-CM

## 2023-04-14 DIAGNOSIS — Z95828 Presence of other vascular implants and grafts: Secondary | ICD-10-CM

## 2023-04-14 DIAGNOSIS — Z79899 Other long term (current) drug therapy: Secondary | ICD-10-CM | POA: Insufficient documentation

## 2023-04-14 DIAGNOSIS — C3491 Malignant neoplasm of unspecified part of right bronchus or lung: Secondary | ICD-10-CM

## 2023-04-14 DIAGNOSIS — C7951 Secondary malignant neoplasm of bone: Secondary | ICD-10-CM | POA: Insufficient documentation

## 2023-04-14 DIAGNOSIS — Z7901 Long term (current) use of anticoagulants: Secondary | ICD-10-CM | POA: Insufficient documentation

## 2023-04-14 DIAGNOSIS — R197 Diarrhea, unspecified: Secondary | ICD-10-CM

## 2023-04-14 DIAGNOSIS — Z87891 Personal history of nicotine dependence: Secondary | ICD-10-CM | POA: Diagnosis not present

## 2023-04-14 DIAGNOSIS — Z800 Family history of malignant neoplasm of digestive organs: Secondary | ICD-10-CM | POA: Insufficient documentation

## 2023-04-14 DIAGNOSIS — Z803 Family history of malignant neoplasm of breast: Secondary | ICD-10-CM | POA: Insufficient documentation

## 2023-04-14 DIAGNOSIS — Z86711 Personal history of pulmonary embolism: Secondary | ICD-10-CM | POA: Diagnosis not present

## 2023-04-14 LAB — CBC WITH DIFFERENTIAL (CANCER CENTER ONLY)
Abs Immature Granulocytes: 0.04 10*3/uL (ref 0.00–0.07)
Basophils Absolute: 0.1 10*3/uL (ref 0.0–0.1)
Basophils Relative: 1 %
Eosinophils Absolute: 0.1 10*3/uL (ref 0.0–0.5)
Eosinophils Relative: 1 %
HCT: 37.5 % (ref 36.0–46.0)
Hemoglobin: 12.5 g/dL (ref 12.0–15.0)
Immature Granulocytes: 0 %
Lymphocytes Relative: 25 %
Lymphs Abs: 2.4 10*3/uL (ref 0.7–4.0)
MCH: 31.6 pg (ref 26.0–34.0)
MCHC: 33.3 g/dL (ref 30.0–36.0)
MCV: 94.9 fL (ref 80.0–100.0)
Monocytes Absolute: 0.8 10*3/uL (ref 0.1–1.0)
Monocytes Relative: 8 %
Neutro Abs: 6.3 10*3/uL (ref 1.7–7.7)
Neutrophils Relative %: 65 %
Platelet Count: 310 10*3/uL (ref 150–400)
RBC: 3.95 MIL/uL (ref 3.87–5.11)
RDW: 15.3 % (ref 11.5–15.5)
WBC Count: 9.8 10*3/uL (ref 4.0–10.5)
nRBC: 0 % (ref 0.0–0.2)

## 2023-04-14 LAB — CMP (CANCER CENTER ONLY)
ALT: 8 U/L (ref 0–44)
AST: 18 U/L (ref 15–41)
Albumin: 3.3 g/dL — ABNORMAL LOW (ref 3.5–5.0)
Alkaline Phosphatase: 86 U/L (ref 38–126)
Anion gap: 8 (ref 5–15)
BUN: 13 mg/dL (ref 8–23)
CO2: 26 mmol/L (ref 22–32)
Calcium: 8.3 mg/dL — ABNORMAL LOW (ref 8.9–10.3)
Chloride: 106 mmol/L (ref 98–111)
Creatinine: 1.04 mg/dL — ABNORMAL HIGH (ref 0.44–1.00)
GFR, Estimated: 53 mL/min — ABNORMAL LOW (ref >60)
Glucose, Bld: 204 mg/dL — ABNORMAL HIGH (ref 70–99)
Potassium: 3.3 mmol/L — ABNORMAL LOW (ref 3.5–5.1)
Sodium: 140 mmol/L (ref 135–145)
Total Bilirubin: 0.3 mg/dL (ref 0.3–1.2)
Total Protein: 7.3 g/dL (ref 6.5–8.1)

## 2023-04-14 MED ORDER — OCTREOTIDE ACETATE 30 MG IM KIT
30.0000 mg | PACK | Freq: Once | INTRAMUSCULAR | Status: AC
  Administered 2023-04-14: 30 mg via INTRAMUSCULAR
  Filled 2023-04-14: qty 1

## 2023-04-14 MED ORDER — DULOXETINE HCL 30 MG PO CPEP: 60.0000 mg | ORAL_CAPSULE | Freq: Every day | ORAL | 3 refills | Status: DC

## 2023-04-14 MED ORDER — DENOSUMAB 120 MG/1.7ML ~~LOC~~ SOLN
120.0000 mg | Freq: Once | SUBCUTANEOUS | Status: AC
  Administered 2023-04-14: 120 mg via SUBCUTANEOUS
  Filled 2023-04-14: qty 1.7

## 2023-04-14 MED ORDER — FENTANYL 12 MCG/HR TD PT72: 1.0000 | MEDICATED_PATCH | TRANSDERMAL | 0 refills | Status: DC

## 2023-04-14 MED ORDER — HYDROCODONE-ACETAMINOPHEN 5-325 MG PO TABS: 1.0000 | ORAL_TABLET | Freq: Four times a day (QID) | ORAL | 0 refills | Status: DC | PRN

## 2023-04-14 NOTE — Progress Notes (Signed)
HEMATOLOGY ONCOLOGY CLINIC VISIT NOTE  Date of service:  04/14/23    Patient Care Team: Melida Quitter, PA as PCP - General (Family Medicine) Johney Maine, MD as Consulting Physician (Hematology and Oncology)  CHIEF COMPLAINTS/PURPOSE OF CONSULTATION:  Follow-up for continued evaluation and management of metastatic lung cancer Follow-up for esophageal cancer  DIAGNOSIS:   #1 Metastatic non-small cell lung cancer with bilateral lung nodules and large metastatic lesion in the left Ilium. 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 Also had c diff colitis - treated   Current Treatment  1) Active surveillance 2) Xgeva 120mg  [place] q12weeks for bone metastases. 3) Sandostatin q4weeks for diarrhea   Previous Treatment  For metastatic lung cancer 1 Palliative radiation therapy to the large left ilium metastases 2. IV Nivolumab x 20 cycles (discontinued due to likely immune colitis) 3. Xgeva 120mg  [place] q4weeks for bone metastases.  For Esophageal adenocarcinoma S/p Concurrent carbo/taxol + RT  HISTORY OF PRESENTING ILLNESS: (plz see my previous consultation for details of initial presentation)  INTERVAL HISTORY:  Patient is here for continued evaluation and management of pleural metastatic lung cancer and esophageal cancer and her diffuse.  Patient was last seen by me on 01/20/2023 and complained of right eye pain and occasional burning related to her severe shingles infection. She also complained of pain in her right eyebrow, occasional left leg pain, and consistent SOB.  Today, she reports that she has had additional eye surgery recently. She continues to have frequent right eye pain and reports noticing pain every 60 seconds. She notes that the pain worsened after surgery. She denies any fever or chills, but does endorse night sweats sometimes.  Patient regularly takes Gabapentin 600 3x day. Patient continues  to take Cymbalta but has has not taken any Hydrocortone. Patient does take Imodium as needed with any diarrhea episodes.   Patient has normal p.o. intake. She denies any diarrhea, swallowing issues, or new bone pains.She does report a fall recently after lifting heavy objects in the garage which caused hip pain which continues to persist.  She complains of gradually worsened breathing issues occurring after activities such as bending over and walking short distances. She reports that her SOB is at its worse. She has tried three different types of inhalers which do not improve symptoms. She is not taking any Xarelto at this time. She does take Aspirin at night.   She reports that she received her first shingles vaccination, and is planning on receiving the second vaccine. She has tolerated the vaccine well.  MEDICAL HISTORY:  Past Medical History:  Diagnosis Date   Barrett's esophagus    Bilateral pulmonary embolism (HCC) 09/07/2016   09/02/16 bilateral pulmonary emboli in context of extensive bilateral lower extremity deep venous thromboses Assumed hypercoagulability due to non-small cell metastatic lung cancer Lifelong anticoagulation recommended   Bone neoplasm 06/24/2015   Cancer (HCC)    metastatic poorly differentiated carcinoma. tumor left groin surgical removal with radiation tx.   Cataract    BILATERAL   Cigarette smoker two packs a day or less    Currently still smoking 2 PPD - Not interested in quitting at this time.   Colitis 2017   Colon polyps    hyperplastic, tubular adenomas, tubulovillous adenoma   Cough, persistent    hx. lung cancer ? primary-being evaluated, unsure of primary site.   Depression 06/24/2015   Diverticulosis    DVT of lower  extremity, bilateral (HCC) 09/07/2016   Emphysema of lung (HCC)    Endometriosis    Hysterectomy with BSO at age 42 yrs   Esophageal adenocarcinoma (HCC) 08/11/15   intramucosal   Gastritis    GERD (gastroesophageal reflux disease)     H/O: pneumonia    Hiatal hernia    Hyperlipidemia    Hypertension 06/24/2015   likely improved incidental to 40 lbs weight loss from her neoplasm. No Longer taking med for this as of 08-06-15   IBS (irritable bowel syndrome)    Pain    left hip-persistent"tumor of bone"-radiation tx. 10.   Vitamin D deficiency disease    SURGICAL HISTORY: Past Surgical History:  Procedure Laterality Date   ABDOMINAL HYSTERECTOMY     BALLOON DILATION N/A 10/08/2019   Procedure: BALLOON DILATION;  Surgeon: Beverley Fiedler, MD;  Location: [facility];  Service: Gastroenterology;  Laterality: N/A;   BARTHOLIN GLAND CYST EXCISION  82 yo ago   Does not want if it was an infected cyst or tumor. Was soon as delivery   BIOPSY  01/02/2019   Procedure: BIOPSY;  Surgeon: Beverley Fiedler, MD;  Location: [facility];  Service: Gastroenterology;;   CATARACT EXTRACTION     COLONOSCOPY W/ POLYPECTOMY     multiple times - last done 09/2014 per patient.   ESOPHAGOGASTRODUODENOSCOPY (EGD) WITH PROPOFOL N/A 08/11/2015   Procedure: ESOPHAGOGASTRODUODENOSCOPY (EGD) WITH PROPOFOL;  Surgeon: Beverley Fiedler, MD;  Location: [facility];  Service: Gastroenterology;  Laterality: N/A;   ESOPHAGOGASTRODUODENOSCOPY (EGD) WITH PROPOFOL N/A 01/02/2019   Procedure: ESOPHAGOGASTRODUODENOSCOPY (EGD) WITH PROPOFOL;  Surgeon: Beverley Fiedler, MD;  Location: [facility];  Service: Gastroenterology;  Laterality: N/A;   ESOPHAGOGASTRODUODENOSCOPY (EGD) WITH PROPOFOL N/A 10/08/2019   Procedure: ESOPHAGOGASTRODUODENOSCOPY (EGD) WITH PROPOFOL;  Surgeon: Beverley Fiedler, MD;  Location: [facility];  Service: Gastroenterology;  Laterality: N/A;   FLEXIBLE SIGMOIDOSCOPY N/A 06/24/2017   Procedure: FLEXIBLE SIGMOIDOSCOPY;  Surgeon: Ruffin Frederick, MD;  Location: [facility];  Service: Gastroenterology;  Laterality: N/A;   GANGLION CYST EXCISION     KNEE ARTHROSCOPY  age about 47 yrs   TONSILLECTOMY     TOTAL ABDOMINAL HYSTERECTOMY W/ BILATERAL  SALPINGOOPHORECTOMY  at age 54 yrs   For endometriosis    SOCIAL HISTORY: Social History   Socioeconomic History   Marital status: Widowed    Spouse name: Not on file   Number of children: 2   Years of education: Not on file   Highest education level: Not on file  Occupational History   Not on file  Tobacco Use   Smoking status: Former    Packs/day: 1.00    Years: 60.00    Additional pack years: 0.00    Total pack years: 60.00    Types: Cigarettes    Quit date: 12/05/2014    Years since quitting: 8.3    Passive exposure: Never   Smokeless tobacco: Never  Vaping Use   Vaping Use: Never used  Substance and Sexual Activity   Alcohol use: No    Alcohol/week: 0.0 standard drinks of alcohol   Drug use: No   Sexual activity: Not Currently  Other Topics Concern   Not on file  Social History Narrative   Not on file   Social Determinants of Health   Financial Resource Strain: Low Risk  (03/28/2023)   Overall Financial Resource Strain (CARDIA)    Difficulty of Paying Living Expenses: Not hard at all  Food Insecurity: No Food Insecurity (03/28/2023)  Hunger Vital Sign    Worried About Running Out of Food in the Last Year: Never true    Ran Out of Food in the Last Year: Never true  Transportation Needs: No Transportation Needs (03/28/2023)   PRAPARE - Administrator, Civil Service (Medical): No    Lack of Transportation (Non-Medical): No  Physical Activity: Inactive (03/28/2023)   Exercise Vital Sign    Days of Exercise per Week: 0 days    Minutes of Exercise per Session: 0 min  Stress: No Stress Concern Present (03/28/2023)   [employer] of Occupational Health - Occupational Stress Questionnaire    Feeling of Stress : Not at all  Social Connections: Unknown (03/28/2023)   Social Connection and Isolation Panel [NHANES]    Frequency of Communication with Friends and Family: Patient declined    Frequency of Social Gatherings with Friends and Family: Patient  declined    Attends Religious Services: Patient declined    Database administrator or Organizations: Patient declined    Attends Banker Meetings: Patient declined    Marital Status: Widowed  Intimate Partner Violence: Not At Risk (03/28/2023)   Humiliation, Afraid, Rape, and Kick questionnaire    Fear of Current or Ex-Partner: No    Emotionally Abused: No    Physically Abused: No    Sexually Abused: No    FAMILY HISTORY: Family History  Problem Relation Age of Onset   Colon cancer Brother    Colon cancer Brother    Stroke Mother    Colon cancer Father    Emphysema Father        smoked   Breast cancer Daughter 60       ER/PR+ stage II    ALLERGIES:  is allergic to penicillins, remeron [mirtazapine], and latex. patient wonders if she has a penicillin allergy but notes that she is uncertain about this.  MEDICATIONS:  Current Outpatient Medications  Medication Sig Dispense Refill   acetaminophen (TYLENOL) 325 MG tablet Take 2 tablets (650 mg total) by mouth every 6 (six) hours as needed for mild pain (or Fever >/= 101). 20 tablet 0   albuterol (VENTOLIN HFA) 108 (90 Base) MCG/ACT inhaler Inhale 2 puffs into the lungs every 6 (six) hours as needed for wheezing. 2 each 11   Biotin 5 MG CAPS Take 5 mg by mouth daily.     Budeson-Glycopyrrol-Formoterol (BREZTRI AEROSPHERE) 160-9-4.8 MCG/ACT AERO Inhale 2 puffs into the lungs 2 (two) times daily. 10.7 g 1   buPROPion (WELLBUTRIN XL) 150 MG 24 hr tablet TAKE 1 TABLET BY MOUTH EVERY DAY 30 tablet 0   Calcium Citrate-Vitamin D (CALCIUM + D PO) Take 1 tablet by mouth daily.     Cyanocobalamin (B-12) 2500 MCG TABS Take 2,500 mcg by mouth daily.     DULoxetine (CYMBALTA) 30 MG capsule TAKE 1 CAPSULE BY MOUTH EVERY DAY 90 capsule 3   feeding supplement (ENSURE ENLIVE / ENSURE PLUS) LIQD Take 237 mLs by mouth 2 (two) times daily between meals. 237 mL 12   fluconazole (DIFLUCAN) 200 MG tablet Take 200 mg by mouth daily.      gabapentin (NEURONTIN) 600 MG tablet Take 1 tablet (600 mg total) by mouth 3 (three) times daily. 270 tablet 0   HYDROcodone-acetaminophen (NORCO) 5-325 MG tablet Take 1 tablet by mouth every 6 (six) hours as needed for moderate pain. 30 tablet 0   neomycin-polymyxin b-dexamethasone (MAXITROL) 3.5-10000-0.1 SUSP Place 1 drop into the right eye 4 (  four) times daily.     omeprazole (PRILOSEC) 40 MG capsule TAKE 1 CAPSULE BY MOUTH EVERY DAY BEFORE BREAKFAST 90 capsule 1   ondansetron (ZOFRAN-ODT) 4 MG disintegrating tablet Take 1 tablet (4 mg total) by mouth every 8 (eight) hours as needed for nausea or vomiting. 20 tablet 0   sulfamethoxazole-trimethoprim (BACTRIM DS) 800-160 MG tablet Take 1 tablet by mouth 2 (two) times daily. 20 tablet 0   traZODone (DESYREL) 50 MG tablet Take 1 tablet (50 mg total) by mouth at bedtime as needed for sleep. 90 tablet 2   valACYclovir (VALTREX) 500 MG tablet TAKE 1 TABLET BY MOUTH TWICE A DAY 180 tablet 3   No current facility-administered medications for this visit.   REVIEW OF SYSTEMS:  10 Point review of Systems was done is negative except as noted above.   PHYSICAL EXAMINATION:  .BP (!) 160/86 (BP Location: Left Arm, Patient Position: Sitting) Comment: nurse is aware  Pulse 84   Temp 97.9 F (36.6 C) (Temporal)   Resp 17   Ht 5\' 8"  (1.727 m)   Wt 140 lb 9.6 oz (63.8 kg)   SpO2 96%   BMI 21.38 kg/m    GENERAL:alert, in no acute distress and comfortable SKIN: rt eyelide sx changes EYES: conjunctiva are pink and non-injected, sclera anicteric OROPHARYNX: MMM, no exudates, no oropharyngeal erythema or ulceration NECK: supple, no JVD LYMPH:  no palpable lymphadenopathy in the cervical, axillary or inguinal regions LUNGS: clear to auscultation b/l with normal respiratory effort HEART: regular rate & rhythm ABDOMEN:  normoactive bowel sounds , non tender, not distended. Extremity: no pedal edema PSYCH: alert & oriented x 3 with fluent speech NEURO:  no focal motor/sensory deficits    LABORATORY DATA:  I have reviewed the data as listed  .    Latest Ref Rng & Units 04/14/2023   11:44 AM 01/20/2023   12:38 PM 10/21/2022   12:41 PM  CBC  WBC 4.0 - 10.5 K/uL 9.8  10.5  11.1   Hemoglobin 12.0 - 15.0 g/dL 09.8  11.9  14.7   Hematocrit 36.0 - 46.0 % 37.5  36.3  37.5   Platelets 150 - 400 K/uL 310  326  349    ANC 1.8k .    Latest Ref Rng & Units 04/14/2023   11:44 AM 01/20/2023   12:38 PM 10/21/2022   12:41 PM  CMP  Glucose 70 - 99 mg/dL 829  562  130   BUN 8 - 23 mg/dL 13  10  21    Creatinine 0.44 - 1.00 mg/dL 8.65  7.84  6.96   Sodium 135 - 145 mmol/L 140  141  139   Potassium 3.5 - 5.1 mmol/L 3.3  3.7  3.7   Chloride 98 - 111 mmol/L 106  110  109   CO2 22 - 32 mmol/L 26  26  24    Calcium 8.9 - 10.3 mg/dL 8.3  8.1  29.5   Total Protein 6.5 - 8.1 g/dL 7.3  6.7  7.6   Total Bilirubin 0.3 - 1.2 mg/dL 0.3  0.3  0.4   Alkaline Phos 38 - 126 U/L 86  70  71   AST 15 - 41 U/L 18  17  24    ALT 0 - 44 U/L 8  6  16          01/02/19 Esophagus Biopsy:    RADIOGRAPHIC STUDIES: .No results found.  ASSESSMENT & PLAN:   82 y.o. female with  #1  Metastatic poorly differentiated carcinoma with likely lung primary non-small cell lung cancer.    CT of the head with and without contrast showed no evidence of metastatic disease. EGFR blood test mutation analysis negative. CT chest abdomen pelvis 04/19/2016 shows no evidence of disease progression. Patient tolerated Nivolumab very well but was discontinued when she developed grade 2 Immune colitis. Has been off Nivolumab for >6 months  CT chest abdomen pelvis on 06/24/2016 shows no evidence of new disease or progression of metastatic disease. CT chest abdomen pelvis 09/06/2016 shows 1. Mixed interval response to therapy. 2. There is a new left ventral chest wall lesion deep to the pectoralis musculature worrisome for metastatic disease. 3. Posterior lower lobe nodular densities are  identified which may reflect areas of pulmonary metastasis. 4. Interval decrease in size of destructive lesion involving the left iliac bone.  CT chest abd pelvis 12/08/2016: Cystic mass involving the left ventral chest wall has resolved in the interval. Likely was a hematoma due to trauma. Interval increase in size of pleural base mass overlying the posterior and inferior left lower lobe. There is also a new left pleural effusion identified.  CT chest 02/01/2017: Residual irregular soft tissue thickening/volume loss and trace left pleural fluid at the base of the left hemithorax, overall improved in appearance from 12/08/2016. No measurable lesion.  CT chest 05/29/2017 shows no residual pleural based mass or significant pleural effusion in the left hemithorax. No evidence of thoracic metastatic disease. No evidence of progressive metastatic disease within the abdomen or pelvis. Mixed lytic and blastic lesion involving the left iliac bone and associated pathologic fracture are unchanged.   CT CAP 09/14/17 shows no new changes. She does have slight displacement of her fractured left iliac bone. Evidence of stable disease.   CT CAP 01/04/2018- No new or progressive metastatic disease. Stable large left iliac bone metastasis with associated chronic pathologic fracture.   CT chest/abd/pelvis done on 04/26/18 revealed Stable exam.  No new or progressive interval findings.  07/19/18 CT C/A/P revealed Stable exam.  No new or progressive interval findings. Large destructive left iliac lesion is similar to prior. Aortic Atherosclerosis and Emphysema.    11/06/18 CT C/A/P revealed Similar appearance of large mixed lytic and sclerotic lesion in the left ilium. No new metastatic lesions are otherwise noted elsewhere in the chest, abdomen or pelvis. 2. Interval development of thickening of the distal third of the esophagus. This is nonspecific, and could be related to underlying reflux esophagitis. However, if there is  any clinical concern for Barrett's metaplasia or esophageal neoplasia, further evaluation with nonemergent endoscopy could be considered. 3. Aortic atherosclerosis, in addition to left main coronary artery disease. Assessment for potential risk factor modification, dietary therapy or pharmacologic therapy may be warranted, if clinically Indicated. 4. Diffuse bronchial wall thickening with mild to moderate centrilobular and paraseptal emphysema; imaging findings suggestive of underlying COPD. 5. Additional incidental findings, as above.  #2  Adenocarcinoma of the Esophagus  Barrett's esophagus 4cms in the distal esophagus with low and high-grade dysplasia  01/02/19 Surgical pathology revealed adenocarcinoma of the esophagus   01/25/19 PET/CT revealed Distal esophageal primary, without hypermetabolic metastatic disease. 2. Chronic left iliac metastasis, as before. 3. Hypermetabolism within and superficial to the right gluteal musculature is most likely related to trauma and/or injection sites. 4. Aortic atherosclerosis, coronary artery atherosclerosis and emphysema.  S/p concurrent Carboplatin and Taxol weekly with RT of 45 Gy in 25 fractions and 5.4 Gy boost, completed between 02/04/19 and 03/27/19  07/03/2019 PET skull base to thigh revealed "1. Interval response to therapy. Significant reduction in FDG uptake associated with distal esophageal mass. SUV max currently 2.61 versus 16.97 previously. 2. 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 Unchanged 3. Aortic Atherosclerosis (ICD10-I70.0) and Emphysema (ICD10-J43.9). Coronary artery calcifications."  07/15/2020 CT C/A/P ([id number]) ([id number]) revealed "1. No evidence of new or progressive metastatic disease in the chest, abdomen or pelvis."  #3 diarrhea-  now resolved was previously. S/p grade 2 likely related to immune colitis from her Nivolumab and also had c diff colitis (s/p vancomycin) and possible underlying IBD Now better  controlled. She was previously on on Lialda, budesonide,probiotics and lomotil but not currently taking any of these. Plan -Continue Sandostatin every 4 weeks   #4 h/o DVT and PE  -continue on Xarelto - no issues with bleeding   #5 history of COPD management per primary care physician  #6 severe shingles of the forehead with postherpetic neuralgia  PLAN:  -Discussed lab results on 04/14/2023 in detail with patient. CBC showed WBC of 9.8K, hemoglobin of 12.5, and platelets of 310K. -No sign of pneumonia  -Potassium low, mild dehydration -Has no symptoms suggestive of esophageal cancer or lung cancer progression at this time. -will start low-dose fentanyl patch. Instructed patient to change patch every 3 days. Advised patient to have a family member monitor them to ensure patient tolerates Fentanyl patch. -Continue Gabapentin for neuropathy management -will increase dose of Cymbalta from 30mg  to 60mg  po daily -will order Hydrocortone to take as needed -Monthly sandostatin -Xgeva every 3 months -will order CT chest/abdm/pelvis scan for further evaluation of SOB  FOLLOW UP: -CTA chest + CT abd/pelvis in 1 week -Phone visit with Dr Candise Che in 2 weeks  The total time spent in the appointment was 32 minutes* .  All of the patient's questions were answered with apparent satisfaction. The patient knows to call the clinic with any problems, questions or concerns.   Wyvonnia Lora MD MS AAHIVMS [facility] Hematology/Oncology Physician [facility]  .*Total Encounter Time as defined by the Centers for Medicare and Medicaid Services includes, in addition to the face-to-face time of a patient visit (documented in the note above) non-face-to-face time: obtaining and reviewing outside history, ordering and reviewing medications, tests or procedures, care coordination (communications with other health care professionals or caregivers) and documentation in the medical record.    I,Mitra  Faeizi,acting as a Neurosurgeon for Wyvonnia Lora, MD.,have documented all relevant documentation on the behalf of Wyvonnia Lora, MD,as directed by  Wyvonnia Lora, MD while in the presence of Wyvonnia Lora, MD.  .I have reviewed the above documentation for accuracy and completeness, and I agree with the above. Johney Maine MD

## 2023-04-16 ENCOUNTER — Other Ambulatory Visit: Payer: Self-pay | Admitting: Hematology

## 2023-04-18 ENCOUNTER — Telehealth: Payer: Self-pay | Admitting: Hematology

## 2023-04-21 ENCOUNTER — Encounter: Payer: Self-pay | Admitting: Hematology

## 2023-04-25 ENCOUNTER — Ambulatory Visit: Payer: Medicare Other | Admitting: Family Medicine

## 2023-05-02 ENCOUNTER — Encounter: Payer: Self-pay | Admitting: Family Medicine

## 2023-05-02 ENCOUNTER — Ambulatory Visit (INDEPENDENT_AMBULATORY_CARE_PROVIDER_SITE_OTHER): Payer: Medicare Other | Admitting: Family Medicine

## 2023-05-02 VITALS — BP 160/73 | HR 83 | Resp 18 | Ht 68.0 in | Wt 142.0 lb

## 2023-05-02 DIAGNOSIS — B0230 Zoster ocular disease, unspecified: Secondary | ICD-10-CM | POA: Diagnosis not present

## 2023-05-02 DIAGNOSIS — R82998 Other abnormal findings in urine: Secondary | ICD-10-CM

## 2023-05-02 DIAGNOSIS — R300 Dysuria: Secondary | ICD-10-CM | POA: Diagnosis not present

## 2023-05-02 LAB — POCT URINALYSIS DIPSTICK
Bilirubin, UA: NEGATIVE
Blood, UA: NEGATIVE
Glucose, UA: NEGATIVE
Ketones, UA: NEGATIVE
Nitrite, UA: POSITIVE
Protein, UA: POSITIVE — AB
Spec Grav, UA: 1.025 (ref 1.010–1.025)
Urobilinogen, UA: 0.2 E.U./dL
pH, UA: 5.5 (ref 5.0–8.0)

## 2023-05-02 MED ORDER — SULFAMETHOXAZOLE-TRIMETHOPRIM 800-160 MG PO TABS
1.0000 | ORAL_TABLET | Freq: Two times a day (BID) | ORAL | 0 refills | Status: DC
Start: 2023-05-02 — End: 2023-05-08

## 2023-05-02 NOTE — Assessment & Plan Note (Signed)
Continue gabapentin 600 mg 3 times a day for postherpetic neuralgia.  Pain is fairly well-controlled.  She is at the maximum dose.  For other indications, gabapentin immediate release has been shown to be safe up to 2400-3600 mg daily, but for postherpetic neuralgia efficacy is not shown to improve past 1800 mg daily.  Patient verbalized understanding and is agreeable with this plan.

## 2023-05-02 NOTE — Progress Notes (Signed)
Established Patient Office Visit  Subjective   Patient ID: Cindy Byrd, female    DOB: 04-04-1941  Age: 82 y.o. MRN: [id number]  Chief Complaint  Patient presents with   Pain Management    HPI Cindy Byrd is a 82 y.o. female presenting today for follow up of pain management. Since last appointment, had second eye surgery. Increased gabapentin to 600 mg three times daily for postherpetic neuralgia.  The higher dose has been working well for her.  She also received a fentanyl patch from her oncologist, and between the 2 of them her pain is generally well-controlled.  She has now completed her fourth eye surgery, and it is healing well.  She does also endorse some recent urinary frequency and dysuria, so she would like to have her urine checked today.  ROS Negative unless otherwise noted in HPI   Objective:     BP (!) 160/73 (BP Location: Left Arm, Patient Position: Sitting, Cuff Size: Normal)   Pulse 83   Resp 18   Ht 5\' 8"  (1.727 m)   Wt 142 lb (64.4 kg)   SpO2 98%   BMI 21.59 kg/m   Physical Exam Constitutional:      General: She is not in acute distress.    Appearance: Normal appearance.  HENT:     Head: Normocephalic and atraumatic.  Cardiovascular:     Rate and Rhythm: Normal rate and regular rhythm.     Heart sounds: No murmur heard.    No friction rub. No gallop.  Pulmonary:     Effort: Pulmonary effort is normal. No respiratory distress.     Breath sounds: No wheezing, rhonchi or rales.  Skin:    General: Skin is warm and dry.  Neurological:     Mental Status: She is alert and oriented to person, place, and time.    Results for orders placed or performed in visit on 05/02/23  POCT Urinalysis Dipstick  Result Value Ref Range   Color, UA yelloe    Clarity, UA clear    Glucose, UA Negative Negative   Bilirubin, UA Negative    Ketones, UA Negative    Spec Grav, UA 1.025 1.010 - 1.025   Blood, UA Negative    pH, UA 5.5 5.0 - 8.0   Protein, UA  Positive (A) Negative   Urobilinogen, UA 0.2 0.2 or 1.0 E.U./dL   Nitrite, UA Positive    Leukocytes, UA Small (1+) (A) Negative   Appearance     Odor       Assessment & Plan:  Herpes zoster ophthalmicus of right eye Assessment & Plan: Continue gabapentin 600 mg 3 times a day for postherpetic neuralgia.  Pain is fairly well-controlled.  She is at the maximum dose.  For other indications, gabapentin immediate release has been shown to be safe up to 2400-3600 mg daily, but for postherpetic neuralgia efficacy is not shown to improve past 1800 mg daily.  Patient verbalized understanding and is agreeable with this plan.   Dysuria -     POCT urinalysis dipstick -     Sulfamethoxazole-Trimethoprim; Take 1 tablet by mouth 2 (two) times daily.  Dispense: 10 tablet; Refill: 0  Leukocytes in urine -     Urine Culture; Future -     Sulfamethoxazole-Trimethoprim; Take 1 tablet by mouth 2 (two) times daily.  Dispense: 10 tablet; Refill: 0  Urinalysis positive for leukocytes, will treat for UTI given corresponding urinary frequency and dysuria.  While waiting for  culture, sending in prescription for Bactrim.  Patient does not need any refills today.  Return in about 6 weeks (around 06/13/2023) for follow-up for pain management.    Melida Quitter, PA

## 2023-05-04 ENCOUNTER — Ambulatory Visit (HOSPITAL_COMMUNITY)
Admission: RE | Admit: 2023-05-04 | Discharge: 2023-05-04 | Disposition: A | Discharge status: Home / Self Care | Payer: Medicare Other | Source: Ambulatory Visit | Attending: Hematology | Admitting: Hematology

## 2023-05-04 DIAGNOSIS — C159 Malignant neoplasm of esophagus, unspecified: Secondary | ICD-10-CM | POA: Diagnosis not present

## 2023-05-04 DIAGNOSIS — K8020 Calculus of gallbladder without cholecystitis without obstruction: Secondary | ICD-10-CM | POA: Diagnosis not present

## 2023-05-04 DIAGNOSIS — C3490 Malignant neoplasm of unspecified part of unspecified bronchus or lung: Secondary | ICD-10-CM | POA: Insufficient documentation

## 2023-05-04 DIAGNOSIS — R0600 Dyspnea, unspecified: Secondary | ICD-10-CM | POA: Diagnosis not present

## 2023-05-04 DIAGNOSIS — R0602 Shortness of breath: Secondary | ICD-10-CM | POA: Diagnosis not present

## 2023-05-04 DIAGNOSIS — C7951 Secondary malignant neoplasm of bone: Secondary | ICD-10-CM | POA: Diagnosis not present

## 2023-05-04 DIAGNOSIS — M6281 Muscle weakness (generalized): Secondary | ICD-10-CM | POA: Diagnosis not present

## 2023-05-04 LAB — URINE CULTURE

## 2023-05-04 MED ORDER — IOHEXOL 350 MG/ML SOLN
100.0000 mL | Freq: Once | INTRAVENOUS | Status: AC | PRN
  Administered 2023-05-04: 100 mL via INTRAVENOUS

## 2023-05-05 LAB — URINE CULTURE

## 2023-05-07 ENCOUNTER — Telehealth: Payer: Self-pay | Admitting: Hematology

## 2023-05-07 DIAGNOSIS — C3490 Malignant neoplasm of unspecified part of unspecified bronchus or lung: Secondary | ICD-10-CM

## 2023-05-07 MED ORDER — APIXABAN 2.5 MG PO TABS: 2.5000 mg | ORAL_TABLET | Freq: Two times a day (BID) | ORAL | 2 refills | Status: DC

## 2023-05-07 NOTE — Telephone Encounter (Signed)
Called and discussed the patient's CTA of the chest and CT abdomen pelvis with her in details. CT abdomen pelvis showed no evidence of new disease however the CTA of the chest shows evidence of old PE in the right upper lobe and concern for left hilar lesion that is new that is causing some compression of left pulmonary artery branches which could be possibly causing her new shortness of breath over the last month or so.  Send a staff message to Dr. Mitzi Hansen and Monico Blitz to get their opinion about palliative radiation especially in the context of having had previous esophageal chemoradiation.  Not sure if the field might overlap. She does not appear to have more widespread disease recurrence. I also messaged Dr. Tonia Brooms from interventional pulmonology to see if this lesion is amenable to biopsy bronchoscopically.  I will start her on low-dose Eliquis at 2.5 mg p.o. twice daily to reduce the risk of recurrent DVTs and PE given her previous extensive history of PE and with new cancer recurrence and narrowed arteries.  Patient is amenable to this approach and to palliative radiation if that is recommended.  Eliquis and radiation oncology consultation orders placed. Will wait to hear back from Dr. Tonia Brooms prior to official consultation.  Patient was recommended to go to the emergency room immediately if she has any hemoptysis or increasing shortness of breath.  Wyvonnia Lora MD MS Hematology/Oncology Physician [facility]

## 2023-05-08 ENCOUNTER — Other Ambulatory Visit: Payer: Self-pay | Admitting: Family Medicine

## 2023-05-08 ENCOUNTER — Inpatient Hospital Stay: Payer: Medicare Other | Attending: Hematology | Admitting: Hematology

## 2023-05-08 DIAGNOSIS — R300 Dysuria: Secondary | ICD-10-CM

## 2023-05-08 MED ORDER — CEFDINIR 300 MG PO CAPS
300.0000 mg | ORAL_CAPSULE | Freq: Two times a day (BID) | ORAL | 0 refills | Status: DC
Start: 2023-05-08 — End: 2023-05-29

## 2023-05-09 ENCOUNTER — Encounter: Payer: Self-pay | Admitting: Radiation Oncology

## 2023-05-09 ENCOUNTER — Ambulatory Visit
Admission: RE | Admit: 2023-05-09 | Discharge: 2023-05-09 | Disposition: A | Discharge status: Home / Self Care | Payer: Medicare Other | Source: Ambulatory Visit | Attending: Radiation Oncology | Admitting: Radiation Oncology

## 2023-05-09 VITALS — BP 175/80 | HR 93 | Temp 98.4°F | Resp 18 | Ht 68.0 in | Wt 141.4 lb

## 2023-05-09 DIAGNOSIS — I2699 Other pulmonary embolism without acute cor pulmonale: Secondary | ICD-10-CM | POA: Diagnosis not present

## 2023-05-09 DIAGNOSIS — Z85118 Personal history of other malignant neoplasm of bronchus and lung: Secondary | ICD-10-CM | POA: Insufficient documentation

## 2023-05-09 DIAGNOSIS — C155 Malignant neoplasm of lower third of esophagus: Secondary | ICD-10-CM | POA: Diagnosis not present

## 2023-05-09 DIAGNOSIS — Z86711 Personal history of pulmonary embolism: Secondary | ICD-10-CM | POA: Diagnosis not present

## 2023-05-09 DIAGNOSIS — C3490 Malignant neoplasm of unspecified part of unspecified bronchus or lung: Secondary | ICD-10-CM

## 2023-05-09 DIAGNOSIS — Z7901 Long term (current) use of anticoagulants: Secondary | ICD-10-CM | POA: Diagnosis not present

## 2023-05-09 DIAGNOSIS — E785 Hyperlipidemia, unspecified: Secondary | ICD-10-CM | POA: Diagnosis not present

## 2023-05-09 DIAGNOSIS — Z86718 Personal history of other venous thrombosis and embolism: Secondary | ICD-10-CM | POA: Diagnosis not present

## 2023-05-09 DIAGNOSIS — Z79624 Long term (current) use of inhibitors of nucleotide synthesis: Secondary | ICD-10-CM | POA: Insufficient documentation

## 2023-05-09 DIAGNOSIS — I700 Atherosclerosis of aorta: Secondary | ICD-10-CM | POA: Insufficient documentation

## 2023-05-09 DIAGNOSIS — C7951 Secondary malignant neoplasm of bone: Secondary | ICD-10-CM | POA: Insufficient documentation

## 2023-05-09 DIAGNOSIS — K219 Gastro-esophageal reflux disease without esophagitis: Secondary | ICD-10-CM | POA: Insufficient documentation

## 2023-05-09 DIAGNOSIS — Z800 Family history of malignant neoplasm of digestive organs: Secondary | ICD-10-CM | POA: Insufficient documentation

## 2023-05-09 DIAGNOSIS — K2270 Barrett's esophagus without dysplasia: Secondary | ICD-10-CM | POA: Insufficient documentation

## 2023-05-09 DIAGNOSIS — Z923 Personal history of irradiation: Secondary | ICD-10-CM | POA: Diagnosis not present

## 2023-05-09 DIAGNOSIS — C7802 Secondary malignant neoplasm of left lung: Secondary | ICD-10-CM | POA: Diagnosis not present

## 2023-05-09 DIAGNOSIS — C3482 Malignant neoplasm of overlapping sites of left bronchus and lung: Secondary | ICD-10-CM

## 2023-05-09 DIAGNOSIS — Z8719 Personal history of other diseases of the digestive system: Secondary | ICD-10-CM | POA: Insufficient documentation

## 2023-05-09 DIAGNOSIS — Z79899 Other long term (current) drug therapy: Secondary | ICD-10-CM | POA: Insufficient documentation

## 2023-05-09 DIAGNOSIS — Z87891 Personal history of nicotine dependence: Secondary | ICD-10-CM | POA: Diagnosis not present

## 2023-05-09 DIAGNOSIS — I10 Essential (primary) hypertension: Secondary | ICD-10-CM | POA: Diagnosis not present

## 2023-05-09 NOTE — Progress Notes (Signed)
Radiation Oncology         [phone] ________________________________  Name: Cindy Byrd        MRN: [id number]  Date of Service: 05/09/2023 DOB: 11-Apr-1941  WU:[id number], Simmie Davies, PA  Johney Maine, MD     REFERRING PHYSICIAN: Johney Maine, MD   DIAGNOSIS: The primary encounter diagnosis was Primary malignant neoplasm of lung metastatic to other site, unspecified laterality [facility]). 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   HISTORY OF PRESENT ILLNESS: Cindy Byrd is a 82 y.o. female with a history of stage IV non-small cell carcinoma of the lung that was diagnosed in 2016 after undergoing a left iliac biopsy.  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  In surveillance, interval development of thickening of the distal third of the esophagus, and changes consistent with underlying COPD of the lungs were noted.  Given the finding of the esophageal abnormality, she was sent to be evaluated by gastroenterology.  Of note she also has a history of Barrett's esophagus and a dysplastic nodule that had been identified in 2017 by GI providers at [facility].  She was sent to be seen by Dr. Rhea Belton, and EGD on 01/02/2018 revealed a mass in the distal esophagus and half of the luminal surface was involved.  The lesion was not obstructing.  A biopsy of this site revealed an adenocarcinoma.  She did not have metastatic disease, thus she went on to proceed with definitive chemoradiation to the esophagus which she completed in April 2020.  Since that time, she has been followed, she has been experiencing her last endoscopy November 2020 showed Barrett's esophagus changes but no evidence of a tumor or mass. She has been followed with CT imaging for staging purposes and her most recent on 05/04/2023 showed unchanged lytic and  sclerotic change of the left ilium which is felt to be stable and not active, no evidence of lymphadenopathy in the abdomen or pelvis or solid organ disease.  Long segment wall thickening and mucosal hyperenhancement of the decompressed transverse colon was felt to be nonspecific.  She did have a new soft tissue density in the left hilum narrowing the left lower lobar pulmonary artery measuring 2.2 cm focally excluding the left upper lobe pulmonary artery measuring 1.6 cm.  No discrete enlarged mediastinal or axillary nodes were appreciated.  There was fluid attenuation with concern for endobronchial nodule in the proximal left mainstem bronchus measuring 1.1 cm.  Weblike chronic residual lobe pulmonary embolus in the right upper lobe pulmonary artery and segment branch vessels were also noted.  Given these findings, she is seen to consider additional radiotherapy to the left hilar region.  Dr. Tonia Brooms has also waiting on her case and feels it would be possible to attempt tissue sampling in that region.     PREVIOUS RADIATION THERAPY:   02/04/2019-03/22/2019:  The lower third esophagus was treated to 50 Gy in 25 fractions and a 6 Gy boost over 3 fractions was also given to total 56 Gy.   07/15/15-07/28/15: 30 Gy in 10 fractions to the left iliac bone   PAST MEDICAL HISTORY:  Past Medical History:  Diagnosis Date   Barrett's esophagus    Bilateral pulmonary embolism (HCC) 09/07/2016   09/02/16 bilateral pulmonary emboli in context of extensive bilateral lower extremity deep venous thromboses Assumed  hypercoagulability due to non-small cell metastatic lung cancer Lifelong anticoagulation recommended   Bone neoplasm 06/24/2015   Cancer [facility])    metastatic poorly differentiated carcinoma. tumor left groin surgical removal with radiation tx.   Cataract    BILATERAL   Cigarette smoker two packs a day or less    Currently still smoking 2 PPD - Not interested in quitting at this time.   Colitis 2017    Colon polyps    hyperplastic, tubular adenomas, tubulovillous adenoma   Cough, persistent    hx. lung cancer ? primary-being evaluated, unsure of primary site.   Depression 06/24/2015   Diverticulosis    DVT of lower extremity, bilateral (HCC) 09/07/2016   Emphysema of lung (HCC)    Endometriosis    Hysterectomy with BSO at age 64 yrs   Esophageal adenocarcinoma (HCC) 08/11/15   intramucosal   Gastritis    GERD (gastroesophageal reflux disease)    H/O: pneumonia    Hiatal hernia    Hyperlipidemia    Hypertension 06/24/2015   likely improved incidental to 40 lbs weight loss from her neoplasm. No Longer taking med for this as of 08-06-15   IBS (irritable bowel syndrome)    Pain    left hip-persistent"tumor of bone"-radiation tx. 10.   Vitamin D deficiency disease        PAST SURGICAL HISTORY: Past Surgical History:  Procedure Laterality Date   ABDOMINAL HYSTERECTOMY     BALLOON DILATION N/A 10/08/2019   Procedure: BALLOON DILATION;  Surgeon: Beverley Fiedler, MD;  Location: [facility];  Service: Gastroenterology;  Laterality: N/A;   BARTHOLIN GLAND CYST EXCISION  82 yo ago   Does not want if it was an infected cyst or tumor. Was soon as delivery   BIOPSY  01/02/2019   Procedure: BIOPSY;  Surgeon: Beverley Fiedler, MD;  Location: [facility];  Service: Gastroenterology;;   CATARACT EXTRACTION     COLONOSCOPY W/ POLYPECTOMY     multiple times - last done 09/2014 per patient.   ESOPHAGOGASTRODUODENOSCOPY (EGD) WITH PROPOFOL N/A 08/11/2015   Procedure: ESOPHAGOGASTRODUODENOSCOPY (EGD) WITH PROPOFOL;  Surgeon: Beverley Fiedler, MD;  Location: [facility];  Service: Gastroenterology;  Laterality: N/A;   ESOPHAGOGASTRODUODENOSCOPY (EGD) WITH PROPOFOL N/A 01/02/2019   Procedure: ESOPHAGOGASTRODUODENOSCOPY (EGD) WITH PROPOFOL;  Surgeon: Beverley Fiedler, MD;  Location: [facility];  Service: Gastroenterology;  Laterality: N/A;   ESOPHAGOGASTRODUODENOSCOPY (EGD) WITH PROPOFOL N/A 10/08/2019   Procedure:  ESOPHAGOGASTRODUODENOSCOPY (EGD) WITH PROPOFOL;  Surgeon: Beverley Fiedler, MD;  Location: [facility];  Service: Gastroenterology;  Laterality: N/A;   FLEXIBLE SIGMOIDOSCOPY N/A 06/24/2017   Procedure: FLEXIBLE SIGMOIDOSCOPY;  Surgeon: Ruffin Frederick, MD;  Location: [facility];  Service: Gastroenterology;  Laterality: N/A;   GANGLION CYST EXCISION     KNEE ARTHROSCOPY  age about 74 yrs   TONSILLECTOMY     TOTAL ABDOMINAL HYSTERECTOMY W/ BILATERAL SALPINGOOPHORECTOMY  at age 2 yrs   For endometriosis     FAMILY HISTORY:  Family History  Problem Relation Age of Onset   Colon cancer Brother    Colon cancer Brother    Stroke Mother    Colon cancer Father    Emphysema Father        smoked   Breast cancer Daughter 54       ER/PR+ stage II     SOCIAL HISTORY:  reports that she quit smoking about 8 years ago. Her smoking use included cigarettes. She has a 60.00 pack-year smoking history. She has  never been exposed to tobacco smoke. She has never used smokeless tobacco. She reports that she does not drink alcohol and does not use drugs.  The patient is widowed.  She lives in [place].   ALLERGIES: Penicillins, Remeron [mirtazapine], and Latex   MEDICATIONS:  Current Outpatient Medications  Medication Sig Dispense Refill   acetaminophen (TYLENOL) 325 MG tablet Take 2 tablets (650 mg total) by mouth every 6 (six) hours as needed for mild pain (or Fever >/= 101). 20 tablet 0   albuterol (VENTOLIN HFA) 108 (90 Base) MCG/ACT inhaler Inhale 2 puffs into the lungs every 6 (six) hours as needed for wheezing. 2 each 11   apixaban (ELIQUIS) 2.5 MG TABS tablet Take 1 tablet (2.5 mg total) by mouth 2 (two) times daily. 60 tablet 2   Biotin 5 MG CAPS Take 5 mg by mouth daily.     Budeson-Glycopyrrol-Formoterol (BREZTRI AEROSPHERE) 160-9-4.8 MCG/ACT AERO Inhale 2 puffs into the lungs 2 (two) times daily. 10.7 g 1   buPROPion (WELLBUTRIN XL) 150 MG 24 hr tablet TAKE 1 TABLET BY MOUTH  EVERY DAY 30 tablet 0   Calcium Citrate-Vitamin D (CALCIUM + D PO) Take 1 tablet by mouth daily.     cefdinir (OMNICEF) 300 MG capsule Take 1 capsule (300 mg total) by mouth 2 (two) times daily. 14 capsule 0   Cyanocobalamin (B-12) 2500 MCG TABS Take 2,500 mcg by mouth daily.     DULoxetine (CYMBALTA) 30 MG capsule Take 2 capsules (60 mg total) by mouth daily. 60 capsule 3   feeding supplement (ENSURE ENLIVE / ENSURE PLUS) LIQD Take 237 mLs by mouth 2 (two) times daily between meals. 237 mL 12   fentaNYL (DURAGESIC) 12 MCG/HR Place 1 patch onto the skin every 3 (three) days. 10 patch 0   fluconazole (DIFLUCAN) 200 MG tablet Take 200 mg by mouth daily.     HYDROcodone-acetaminophen (NORCO) 5-325 MG tablet Take 1 tablet by mouth every 6 (six) hours as needed for moderate pain. 30 tablet 0   neomycin-polymyxin b-dexamethasone (MAXITROL) 3.5-10000-0.1 SUSP Place 1 drop into the right eye 4 (four) times daily.     omeprazole (PRILOSEC) 40 MG capsule TAKE 1 CAPSULE BY MOUTH EVERY DAY BEFORE BREAKFAST 90 capsule 1   ondansetron (ZOFRAN-ODT) 4 MG disintegrating tablet Take 1 tablet (4 mg total) by mouth every 8 (eight) hours as needed for nausea or vomiting. 20 tablet 0   valACYclovir (VALTREX) 500 MG tablet TAKE 1 TABLET BY MOUTH TWICE A DAY 180 tablet 3   No current facility-administered medications for this visit.     REVIEW OF SYSTEMS: On review of systems, the patient reports that she is doing well overall.  She reports she is able to eat all foods without disruption, and has not had any unintended weight loss.  She denies any chest pain, shortness of breath, cough, fevers, chills, night sweats.  She denies any bowel or bladder disturbances, and denies abdominal pain, nausea or vomiting.  She denies any new musculoskeletal or joint aches or pains. A complete review of systems is obtained and is otherwise negative.     PHYSICAL EXAM:  Wt Readings from Last 3 Encounters:  05/02/23 142 lb (64.4 kg)   04/14/23 140 lb 9.6 oz (63.8 kg)  03/28/23 138 lb (62.6 kg)   Temp Readings from Last 3 Encounters:  04/14/23 97.9 F (36.6 C) (Temporal)  03/17/23 98.2 F (36.8 C) (Oral)  02/17/23 98.2 F (36.8 C) (Temporal)   BP  Readings from Last 3 Encounters:  05/02/23 (!) 160/73  04/14/23 (!) 160/86  03/17/23 (!) 115/56   Pulse Readings from Last 3 Encounters:  05/02/23 83  04/14/23 84  03/17/23 77    In general this is a well appearing Caucasian female in no acute distress.  She is alert and oriented x4 and appropriate throughout the examination. HEENT reveals that the patient is normocephalic, atraumatic. EOMs are intact. Cardiopulmonary assessment is negative for acute distress and she exhibits normal effort.     ECOG = 1  0 - Asymptomatic (Fully active, able to carry on all predisease activities without restriction)  1 - Symptomatic but completely ambulatory (Restricted in physically strenuous activity but ambulatory and able to carry out work of a light or sedentary nature. For example, light housework, office work)  2 - Symptomatic, <50% in bed during the day (Ambulatory and capable of all self care but unable to carry out any work activities. Up and about more than 50% of waking hours)  3 - Symptomatic, >50% in bed, but not bedbound (Capable of only limited self-care, confined to bed or chair 50% or more of waking hours)  4 - Bedbound (Completely disabled. Cannot carry on any self-care. Totally confined to bed or chair)  5 - Death   Santiago Glad MM, Creech RH, Tormey DC, et al. [phone]). "Toxicity and response criteria of the [facility]". Am. Evlyn Clines. Oncol. 5 (6): 649-55    LABORATORY DATA:  Lab Results  Component Value Date   WBC 9.8 04/14/2023   HGB 12.5 04/14/2023   HCT 37.5 04/14/2023   MCV 94.9 04/14/2023   PLT 310 04/14/2023   Lab Results  Component Value Date   NA 140 04/14/2023   K 3.3 (L) 04/14/2023   CL 106 04/14/2023   CO2 26  04/14/2023   Lab Results  Component Value Date   ALT 8 04/14/2023   AST 18 04/14/2023   ALKPHOS 86 04/14/2023   BILITOT 0.3 04/14/2023      RADIOGRAPHY: CT Abdomen Pelvis W Contrast  Result Date: 05/04/2023 CLINICAL DATA:  Esophageal cancer, metastatic non-small cell lung cancer * Tracking Code: BO * EXAM: CT ABDOMEN AND PELVIS WITH CONTRAST TECHNIQUE: Multidetector CT imaging of the abdomen and pelvis was performed using the standard protocol following bolus administration of intravenous contrast. RADIATION DOSE REDUCTION: This exam was performed according to the departmental dose-optimization program which includes automated exposure control, adjustment of the mA and/or kV according to patient size and/or use of iterative reconstruction technique. CONTRAST:  OMNIPAQUE IOHEXOL 350 MG/ML SOLN COMPARISON:  CT abdomen pelvis, 05/12/2022 FINDINGS: Lower chest: Please see separately reported examination of the chest. Hepatobiliary: No solid liver abnormality is seen. Multiple small benign cysts or hemangiomata, which no further follow-up or characterization is required. Tiny gallstones and or sludge in the gallbladder (series 2, image 31). No gallbladder wall thickening, or biliary dilatation. Pancreas: Unremarkable. No pancreatic ductal dilatation or surrounding inflammatory changes. Spleen: Atrophic appearance of the spleen with capsular retraction, new compared to prior examination (series 2, image 15). Adrenals/Urinary Tract: Adrenal glands are unremarkable. Kidneys are normal, without renal calculi, solid lesion, or hydronephrosis. Bladder is unremarkable. Stomach/Bowel: Stomach is within normal limits. Normal appendix. Long segment wall thickening and mucosal hyperenhancement of the decompressed transverse colon (series 2, image 30). Sigmoid diverticulosis. Vascular/Lymphatic: Aortic atherosclerosis. No enlarged abdominal or pelvic lymph nodes. Reproductive: Status post hysterectomy. Other: No  abdominal wall hernia or abnormality. No ascites. Musculoskeletal: No acute  osseous findings. Unchanged large mixed lytic and sclerotic osseous metastasis of the left ilium a redemonstrated pathologic fracture near the base of the iliac ala (series 2, image 67) IMPRESSION: 1. Unchanged large mixed lytic and sclerotic osseous metastasis of the left ilium. 2. No evidence of lymphadenopathy or other metastatic disease in the abdomen or pelvis. 3. Long segment wall thickening and mucosal hyperenhancement of the decompressed transverse colon, consistent with nonspecific infectious, inflammatory, or ischemic colitis. 4. Atrophic appearance of the spleen with capsular retraction, new compared to prior examination. This is consistent with expected evolution of splenic infarction which in retrospect was seen acutely on examination dated 05/12/2022. 5. Cholelithiasis. Aortic Atherosclerosis (ICD10-I70.0). Electronically Signed   By: Jearld Lesch M.D.   On: 05/04/2023 13:24   CT Angio Chest Pulmonary Embolism (PE) W or WO Contrast  Result Date: 05/04/2023 CLINICAL DATA:  PE suspected, history of metastatic lung cancer shortness of breath and dyspnea * Tracking Code: BO * EXAM: CT ANGIOGRAPHY CHEST WITH CONTRAST TECHNIQUE: Multidetector CT imaging of the chest was performed using the standard protocol during bolus administration of intravenous contrast. Multiplanar CT image reconstructions and MIPs were obtained to evaluate the vascular anatomy. RADIATION DOSE REDUCTION: This exam was performed according to the departmental dose-optimization program which includes automated exposure control, adjustment of the mA and/or kV according to patient size and/or use of iterative reconstruction technique. CONTRAST:  OMNIPAQUE IOHEXOL 350 MG/ML SOLN COMPARISON:  Noncontrast CT chest, 07/15/2022, CT chest abdomen pelvis, 12/16/2021 FINDINGS: Cardiovascular: Right chest port catheter. Satisfactory opacification of the pulmonary  arteries to the segmental level. Web-like, chronic residua of pulmonary embolus in the right upper lobe pulmonary artery and segmental branch vessels (series 5, image 15). Normal heart size. No pericardial effusion. Aortic atherosclerosis. Mediastinum/Nodes: New soft tissue in the left hilum which narrows the left lower lobar pulmonary artery measuring 2.2 x 1.4 cm (series 5, image 125) and focally occludes the left upper lobar pulmonary artery measuring 1.6 x 1.3 cm (series 5, image 110). No discretely enlarged mediastinal, hilar, or axillary lymph nodes. Fluid attenuation endobronchial nodule within the proximal left mainstem bronchus measuring 1.1 x 0.7 cm (series 7, image 95). Thyroid and esophagus demonstrate no significant findings. Lungs/Pleura: Dependent bibasilar scarring or atelectasis. No pleural effusion or pneumothorax. Upper Abdomen: Please see separately reported examination of the abdomen and pelvis. Musculoskeletal: No chest wall abnormality. No acute osseous findings. Review of the MIP images confirms the above findings. IMPRESSION: 1. New soft tissue in the left hilum which narrows the left lower lobar pulmonary artery measuring 2.2 x 1.4 cm, and which focally occludes the left upper lobar pulmonary artery measuring 1.6 x 1.3 cm. Findings are highly concerning for recurrent malignancy. 2. Fluid attenuation endobronchial nodule within the proximal left mainstem bronchus measuring 1.1 x 0.7 cm, possibly secretions although malignant recurrence not excluded. 3. Web-like, chronic residua of pulmonary embolus in the right upper lobe pulmonary artery and segmental branch vessels. No other evidence of pulmonary embolism. These results will be called to the ordering clinician or representative by the Radiologist Assistant, and communication documented in the PACS or [employer]. Aortic Atherosclerosis (ICD10-I70.0). Electronically Signed   By: Jearld Lesch M.D.   On: 05/04/2023 13:03        IMPRESSION/PLAN: 1. History of Stage IV poorly differentiated carcinoma of the lung.  2. Adenocarcinoma of the distal esophagus.   The patient overall has done very well given her multiple diagnoses cancer.  Recent CT imaging shows a  single focus of recurrence corresponding to a 2.2 cm tumor within the left hilar region.  There is also an area in question within the left mainstem bronchus which could represent secretions versus an additional second lesion.  The dominant area in question is not within the previous high-dose region for the patient's previous treatment of her esophageal tumor.  It is close by so in general this would represent a course of reirradiation but I believe it would be feasible to give additional treatment to this area.  I believe a biopsy would be helpful, including clarifying the nature of the second area in question.  The patient is amenable to this.  We will therefore proceed with a biopsy and then tentatively plan to proceed with a course of ultra hypofractionation to the left hilar region given the lack of other areas of disease currently.  We discussed the rationale of this treatment and all of her questions were answered.  She does look forward to proceed with this overall plan.  In a visit lasting 50 minutes, greater than 50% of the time was spent face to face discussing her case, and coordinating the patient's care.  The above documentation reflects my direct findings during this shared patient visit. Please see the separate note by Dr. Mitzi Hansen on this date for the remainder of the patient's plan of care.   ------------------------------------------------  Radene Gunning, MD, PhD

## 2023-05-09 NOTE — Progress Notes (Signed)
Thoracic Location of Tumor / Histology: Lung Cancer metastatic to bone, hilar region.  Patient presented with increased SOB with activities.  CT AP 05/04/2023: Unchanged large mixed lytic and sclerotic osseous metastasis of the left ilium.  No evidence of lymphadenopathy or other metastatic disease in the abdomen or pelvis.  CTA 05/04/2023: New soft tissue in the left hilum which narrows the left lower lobar pulmonary artery measuring 2.2 x 1.4 cm, and which focally occludes the left upper lobar pulmonary artery measuring 1.6 x 1.3 cm. Findings are highly concerning for recurrent malignancy.  Biopsies of   Tobacco/Marijuana/Snuff/ETOH use:    Past/Anticipated interventions by cardiothoracic surgery, if any:    Past/Anticipated interventions by medical oncology, if any:  Dr. Candise Che 05/07/2023 -CT abdomen pelvis showed no evidence of new disease however the CTA of the chest shows evidence of old PE in the right upper lobe and concern for left hilar lesion that is new that is causing some compression of left pulmonary artery branches which could be possibly causing her new shortness of breath over the last month or so.  -palliative radiation especially in the context of having had previous esophageal chemoradiation.  -I also messaged Dr. Tonia Brooms from interventional pulmonology to see if this lesion is amenable to biopsy bronchoscopically.    Signs/Symptoms Weight changes, if any: No Respiratory complaints, if any: She reports SOB all the time. Hemoptysis, if any: She reports productive cough. Pain issues, if any:  She reports some pain to her right face/forehead from shingles.  SAFETY ISSUES: Prior radiation?  02/04/2019-03/22/2019: The lower third esophagus was treated to 50 Gy in 25 fractions and a 6 Gy boost over 3 fractions was also given to total 56 Gy.  07/15/15-07/28/15: 30 Gy in 10 fractions to the left iliac bone  Pacemaker/ICD? No  Possible current pregnancy? Hysterectomy Is the  patient on methotrexate? No  Current Complaints / other details:

## 2023-05-10 ENCOUNTER — Encounter: Payer: Self-pay | Admitting: Family Medicine

## 2023-05-10 NOTE — Progress Notes (Signed)
Attempted to call patient both at home and mobile phone numbers, voicemail box had not been set up for either.  I will attempt to contact her again regarding her high blood pressure, most recently 175/80 at pulmonology visit.  We may need to discuss restarting a low-dose of amlodipine, next office visit 06/13/2023 with primary care.

## 2023-05-12 ENCOUNTER — Inpatient Hospital Stay: Payer: Medicare Other

## 2023-05-12 DIAGNOSIS — H02213 Cicatricial lagophthalmos right eye, unspecified eyelid: Secondary | ICD-10-CM | POA: Diagnosis not present

## 2023-05-15 ENCOUNTER — Encounter: Payer: Self-pay | Admitting: Hematology

## 2023-05-15 NOTE — Progress Notes (Signed)
This encounter was created in error - please disregard.

## 2023-05-18 DIAGNOSIS — H1830 Unspecified corneal membrane change: Secondary | ICD-10-CM | POA: Diagnosis not present

## 2023-05-18 DIAGNOSIS — H02211 Cicatricial lagophthalmos right upper eyelid: Secondary | ICD-10-CM | POA: Diagnosis not present

## 2023-05-23 ENCOUNTER — Encounter: Payer: Self-pay | Admitting: Pulmonary Disease

## 2023-05-23 ENCOUNTER — Ambulatory Visit (INDEPENDENT_AMBULATORY_CARE_PROVIDER_SITE_OTHER): Payer: Medicare Other | Admitting: Pulmonary Disease

## 2023-05-23 VITALS — BP 138/70 | HR 81 | Ht 68.0 in | Wt 140.8 lb

## 2023-05-23 DIAGNOSIS — R918 Other nonspecific abnormal finding of lung field: Secondary | ICD-10-CM | POA: Diagnosis not present

## 2023-05-23 DIAGNOSIS — C3490 Malignant neoplasm of unspecified part of unspecified bronchus or lung: Secondary | ICD-10-CM

## 2023-05-23 NOTE — Patient Instructions (Addendum)
Thank you for visiting Dr. Tonia Brooms at [facility]. Today we recommend the following:  Orders Placed This Encounter  Procedures   Procedural/ Surgical Case Request: VIDEO BRONCHOSCOPY WITHOUT FLUORO, VIDEO BRONCHOSCOPY WITH ENDOBRONCHIAL ULTRASOUND   Ambulatory referral to Pulmonology   Bronchoscopy 06/06/2023  Return in about 3 weeks (around 06/13/2023) for w/ Kandice Robinsons, NP after Bronchoscopy.    Please do your part to reduce the spread of COVID-19.

## 2023-05-23 NOTE — Progress Notes (Signed)
Synopsis: Referred in June 2024 for abnormal CT chest by Melida Quitter, PA  Subjective:   PATIENT ID: Cindy Byrd GENDER: female DOB: 1941-04-06, MRN: [id number]  Chief Complaint  Patient presents with   Consult    Consult for lung nodule.    This is an 82 year old female, past medical history of esophageal cancer as well as a history of lung cancer treated in 2017 followed by the cancer center.  Also has a history of endometriosis status post hysterectomy, gastroesophageal reflux, hyperlipidemia and hypertension.  Has a history of shortness of breath with exertion and is currently on Breztri for treatment of COPD.  Was referred after having CT of the chest that shows something within the left mainstem concerning for an endobronchial lesion as well as something invading the left pulmonary artery and hilum concerning for malignancy.  Patient is agreeable to proceed with bronchoscopy for evaluation.  Currently anticoagulated.  With the setting of history of DVT.    Past Medical History:  Diagnosis Date   Barrett's esophagus    Bilateral pulmonary embolism (HCC) 09/07/2016   09/02/16 bilateral pulmonary emboli in context of extensive bilateral lower extremity deep venous thromboses Assumed hypercoagulability due to non-small cell metastatic lung cancer Lifelong anticoagulation recommended   Bone neoplasm 06/24/2015   Cancer (HCC)    metastatic poorly differentiated carcinoma. tumor left groin surgical removal with radiation tx.   Cataract    BILATERAL   Cigarette smoker two packs a day or less    Currently still smoking 2 PPD - Not interested in quitting at this time.   Colitis 2017   Colon polyps    hyperplastic, tubular adenomas, tubulovillous adenoma   Cough, persistent    hx. lung cancer ? primary-being evaluated, unsure of primary site.   Depression 06/24/2015   Diverticulosis    DVT of lower extremity, bilateral (HCC) 09/07/2016   Emphysema of lung (HCC)    Endometriosis     Hysterectomy with BSO at age 82 yrs   Esophageal adenocarcinoma (HCC) 08/11/15   intramucosal   Gastritis    GERD (gastroesophageal reflux disease)    H/O: pneumonia    Hiatal hernia    Hyperlipidemia    Hypertension 06/24/2015   likely improved incidental to 40 lbs weight loss from her neoplasm. No Longer taking med for this as of 08-06-15   IBS (irritable bowel syndrome)    Pain    left hip-persistent"tumor of bone"-radiation tx. 10.   Vitamin D deficiency disease      Family History  Problem Relation Age of Onset   Colon cancer Brother    Colon cancer Brother    Stroke Mother    Colon cancer Father    Emphysema Father        smoked   Breast cancer Daughter 32       ER/PR+ stage II     Past Surgical History:  Procedure Laterality Date   ABDOMINAL HYSTERECTOMY     BALLOON DILATION N/A 10/08/2019   Procedure: BALLOON DILATION;  Surgeon: Beverley Fiedler, MD;  Location: [facility];  Service: Gastroenterology;  Laterality: N/A;   BARTHOLIN GLAND CYST EXCISION  82 yo ago   Does not want if it was an infected cyst or tumor. Was soon as delivery   BIOPSY  01/02/2019   Procedure: BIOPSY;  Surgeon: Beverley Fiedler, MD;  Location: [facility];  Service: Gastroenterology;;   CATARACT EXTRACTION     COLONOSCOPY W/ POLYPECTOMY  multiple times - last done 09/2014 per patient.   ESOPHAGOGASTRODUODENOSCOPY (EGD) WITH PROPOFOL N/A 08/11/2015   Procedure: ESOPHAGOGASTRODUODENOSCOPY (EGD) WITH PROPOFOL;  Surgeon: Beverley Fiedler, MD;  Location: [facility];  Service: Gastroenterology;  Laterality: N/A;   ESOPHAGOGASTRODUODENOSCOPY (EGD) WITH PROPOFOL N/A 01/02/2019   Procedure: ESOPHAGOGASTRODUODENOSCOPY (EGD) WITH PROPOFOL;  Surgeon: Beverley Fiedler, MD;  Location: [facility];  Service: Gastroenterology;  Laterality: N/A;   ESOPHAGOGASTRODUODENOSCOPY (EGD) WITH PROPOFOL N/A 10/08/2019   Procedure: ESOPHAGOGASTRODUODENOSCOPY (EGD) WITH PROPOFOL;  Surgeon: Beverley Fiedler, MD;  Location: [facility];  Service: Gastroenterology;  Laterality: N/A;   EYE SURGERY Right 2024   Multiple surgeries over the past year.   FLEXIBLE SIGMOIDOSCOPY N/A 06/24/2017   Procedure: FLEXIBLE SIGMOIDOSCOPY;  Surgeon: Ruffin Frederick, MD;  Location: [facility];  Service: Gastroenterology;  Laterality: N/A;   GANGLION CYST EXCISION     KNEE ARTHROSCOPY  age about 28 yrs   TONSILLECTOMY     TOTAL ABDOMINAL HYSTERECTOMY W/ BILATERAL SALPINGOOPHORECTOMY  at age 67 yrs   For endometriosis    Social History   Socioeconomic History   Marital status: Widowed    Spouse name: Not on file   Number of children: 2   Years of education: Not on file   Highest education level: Not on file  Occupational History   Not on file  Tobacco Use   Smoking status: Former    Packs/day: 1.00    Years: 60.00    Additional pack years: 0.00    Total pack years: 60.00    Types: Cigarettes    Quit date: 12/05/2014    Years since quitting: 8.4    Passive exposure: Never   Smokeless tobacco: Never  Vaping Use   Vaping Use: Never used  Substance and Sexual Activity   Alcohol use: No    Alcohol/week: 0.0 standard drinks of alcohol   Drug use: No   Sexual activity: Not Currently  Other Topics Concern   Not on file  Social History Narrative   Not on file   Social Determinants of Health   Financial Resource Strain: Low Risk  (03/28/2023)   Overall Financial Resource Strain (CARDIA)    Difficulty of Paying Living Expenses: Not hard at all  Food Insecurity: No Food Insecurity (03/28/2023)   Hunger Vital Sign    Worried About Running Out of Food in the Last Year: Never true    Ran Out of Food in the Last Year: Never true  Transportation Needs: No Transportation Needs (03/28/2023)   PRAPARE - Administrator, Civil Service (Medical): No    Lack of Transportation (Non-Medical): No  Physical Activity: Inactive (03/28/2023)   Exercise Vital Sign    Days of Exercise per Week: 0 days    Minutes of  Exercise per Session: 0 min  Stress: No Stress Concern Present (03/28/2023)   [employer] of Occupational Health - Occupational Stress Questionnaire    Feeling of Stress : Not at all  Social Connections: Unknown (03/28/2023)   Social Connection and Isolation Panel [NHANES]    Frequency of Communication with Friends and Family: Patient declined    Frequency of Social Gatherings with Friends and Family: Patient declined    Attends Religious Services: Patient declined    Database administrator or Organizations: Patient declined    Attends Banker Meetings: Patient declined    Marital Status: Widowed  Intimate Partner Violence: Not At Risk (03/28/2023)   Humiliation, Afraid, Rape, and Kick  questionnaire    Fear of Current or Ex-Partner: No    Emotionally Abused: No    Physically Abused: No    Sexually Abused: No     Allergies  Allergen Reactions   Penicillins Other (See Comments)    Unknown; childhood allergy - details unknown Tolerates Ancef   Remeron [Mirtazapine] Other (See Comments)    nightmares   Latex Rash     Outpatient Medications Prior to Visit  Medication Sig Dispense Refill   acetaminophen (TYLENOL) 325 MG tablet Take 2 tablets (650 mg total) by mouth every 6 (six) hours as needed for mild pain (or Fever >/= 101). 20 tablet 0   albuterol (VENTOLIN HFA) 108 (90 Base) MCG/ACT inhaler Inhale 2 puffs into the lungs every 6 (six) hours as needed for wheezing. 2 each 11   apixaban (ELIQUIS) 2.5 MG TABS tablet Take 1 tablet (2.5 mg total) by mouth 2 (two) times daily. 60 tablet 2   Biotin 5 MG CAPS Take 5 mg by mouth daily.     Budeson-Glycopyrrol-Formoterol (BREZTRI AEROSPHERE) 160-9-4.8 MCG/ACT AERO Inhale 2 puffs into the lungs 2 (two) times daily. 10.7 g 1   buPROPion (WELLBUTRIN XL) 150 MG 24 hr tablet TAKE 1 TABLET BY MOUTH EVERY DAY 30 tablet 0   Calcium Citrate-Vitamin D (CALCIUM + D PO) Take 1 tablet by mouth daily.     cefdinir (OMNICEF) 300 MG  capsule Take 1 capsule (300 mg total) by mouth 2 (two) times daily. 14 capsule 0   Cyanocobalamin (B-12) 2500 MCG TABS Take 2,500 mcg by mouth daily.     DULoxetine (CYMBALTA) 30 MG capsule Take 2 capsules (60 mg total) by mouth daily. 60 capsule 3   feeding supplement (ENSURE ENLIVE / ENSURE PLUS) LIQD Take 237 mLs by mouth 2 (two) times daily between meals. 237 mL 12   fentaNYL (DURAGESIC) 12 MCG/HR Place 1 patch onto the skin every 3 (three) days. 10 patch 0   fluconazole (DIFLUCAN) 200 MG tablet Take 200 mg by mouth daily.     HYDROcodone-acetaminophen (NORCO) 5-325 MG tablet Take 1 tablet by mouth every 6 (six) hours as needed for moderate pain. 30 tablet 0   neomycin-polymyxin b-dexamethasone (MAXITROL) 3.5-10000-0.1 SUSP Place 1 drop into the right eye 4 (four) times daily.     omeprazole (PRILOSEC) 40 MG capsule TAKE 1 CAPSULE BY MOUTH EVERY DAY BEFORE BREAKFAST 90 capsule 1   ondansetron (ZOFRAN-ODT) 4 MG disintegrating tablet Take 1 tablet (4 mg total) by mouth every 8 (eight) hours as needed for nausea or vomiting. 20 tablet 0   valACYclovir (VALTREX) 500 MG tablet TAKE 1 TABLET BY MOUTH TWICE A DAY 180 tablet 3   No facility-administered medications prior to visit.    Review of Systems  Constitutional:  Negative for chills, fever, malaise/fatigue and weight loss.  HENT:  Negative for hearing loss, sore throat and tinnitus.   Eyes:  Negative for blurred vision and double vision.  Respiratory:  Positive for shortness of breath. Negative for cough, hemoptysis, sputum production, wheezing and stridor.   Cardiovascular:  Negative for chest pain, palpitations, orthopnea, leg swelling and PND.  Gastrointestinal:  Negative for abdominal pain, constipation, diarrhea, heartburn, nausea and vomiting.  Genitourinary:  Negative for dysuria, hematuria and urgency.  Musculoskeletal:  Negative for joint pain and myalgias.  Skin:  Negative for itching and rash.  Neurological:  Negative for  dizziness, tingling, weakness and headaches.  Endo/Heme/Allergies:  Negative for environmental allergies. Does not bruise/bleed easily.  Psychiatric/Behavioral:  Negative for depression. The patient is not nervous/anxious and does not have insomnia.   All other systems reviewed and are negative.    Objective:  Physical Exam Vitals reviewed.  Constitutional:      General: She is not in acute distress.    Appearance: She is well-developed.  HENT:     Head: Normocephalic and atraumatic.  Eyes:     General: No scleral icterus.    Conjunctiva/sclera: Conjunctivae normal.     Pupils: Pupils are equal, round, and reactive to light.  Neck:     Vascular: No JVD.     Trachea: No tracheal deviation.  Cardiovascular:     Rate and Rhythm: Normal rate and regular rhythm.     Heart sounds: Normal heart sounds. No murmur heard. Pulmonary:     Effort: Pulmonary effort is normal. No tachypnea, accessory muscle usage or respiratory distress.     Breath sounds: No stridor. No wheezing, rhonchi or rales.  Abdominal:     General: There is no distension.     Palpations: Abdomen is soft.     Tenderness: There is no abdominal tenderness.  Musculoskeletal:        General: No tenderness.     Cervical back: Neck supple.  Lymphadenopathy:     Cervical: No cervical adenopathy.  Skin:    General: Skin is warm and dry.     Capillary Refill: Capillary refill takes less than 2 seconds.     Findings: No rash.  Neurological:     Mental Status: She is alert and oriented to person, place, and time.  Psychiatric:        Behavior: Behavior normal.      Vitals:   05/23/23 1013  BP: 138/70  Pulse: 81  SpO2: 94%  Weight: 140 lb 12.8 oz (63.9 kg)  Height: 5\' 8"  (1.727 m)   94% on RA BMI Readings from Last 3 Encounters:  05/23/23 21.41 kg/m  05/09/23 21.50 kg/m  05/02/23 21.59 kg/m   Wt Readings from Last 3 Encounters:  05/23/23 140 lb 12.8 oz (63.9 kg)  05/09/23 141 lb 6 oz (64.1 kg)   05/02/23 142 lb (64.4 kg)     CBC    Component Value Date/Time   WBC 9.8 04/14/2023 1144   WBC 10.7 (H) 09/11/2022 0322   RBC 3.95 04/14/2023 1144   HGB 12.5 04/14/2023 1144   HGB 15.0 06/20/2022 1036   HGB 11.5 (L) 11/16/2017 1422   HCT 37.5 04/14/2023 1144   HCT 44.6 06/20/2022 1036   HCT 37.1 11/16/2017 1422   PLT 310 04/14/2023 1144   PLT 354 06/20/2022 1036   MCV 94.9 04/14/2023 1144   MCV 90 06/20/2022 1036   MCV 90.9 11/16/2017 1422   MCH 31.6 04/14/2023 1144   MCHC 33.3 04/14/2023 1144   RDW 15.3 04/14/2023 1144   RDW 14.5 06/20/2022 1036   RDW 14.1 11/16/2017 1422   LYMPHSABS 2.4 04/14/2023 1144   LYMPHSABS 2.7 06/20/2022 1036   LYMPHSABS 1.6 11/16/2017 1422   MONOABS 0.8 04/14/2023 1144   MONOABS 0.3 11/16/2017 1422   EOSABS 0.1 04/14/2023 1144   EOSABS 0.2 06/20/2022 1036   BASOSABS 0.1 04/14/2023 1144   BASOSABS 0.1 06/20/2022 1036   BASOSABS 0.0 11/16/2017 1422     Chest Imaging: 05/04/2023 CTA chest: Patient with a left hilar lesion and possible endobronchial lesion in the left mainstem concerning for malignancy. The patient's images have been independently reviewed by me.    Pulmonary  Functions Testing Results:     No data to display          FeNO:   Pathology:   Echocardiogram:   Heart Catheterization:     Assessment & Plan:     ICD-10-CM   1. Lung mass  R91.8 Procedural/ Surgical Case Request: VIDEO BRONCHOSCOPY WITHOUT FLUORO, VIDEO BRONCHOSCOPY WITH ENDOBRONCHIAL ULTRASOUND    Ambulatory referral to Pulmonology    2. Primary malignant neoplasm of lung metastatic to other site, unspecified laterality (HCC)  C34.90       Discussion: This is a 82 year old female with a past medical history of COPD on triple therapy inhaler regimen, history of DVT on apixaban.  Patient has a history of lung cancer on the left with metastasis is well as history of esophageal malignancy.  Presents with concern for endobronchial lesion on the left  side and concern for recurrence based on CT imaging.  Plan: Today in the office we talked about bronchoscopy and biopsy for evaluation of what ever is in the left mainstem. We talked about the possibility of this being mucous and secretions as well however due to the what appears to be small lesion in the left hilum encroaching the pulmonary artery the concern is there is underlying malignancy and recurrence. Will plan for videobronchoscopy with possible cryotherapy for endobronchial lesion at or videobronchoscopy endobronchial ultrasound and biopsy of the left hilar lesion. Patient is agreeable to proceed. Will need to stop Eliquis 2 full days prior. Tentative bronchoscopy date will be on 06/06/2023.   Current Outpatient Medications:    acetaminophen (TYLENOL) 325 MG tablet, Take 2 tablets (650 mg total) by mouth every 6 (six) hours as needed for mild pain (or Fever >/= 101)., Disp: 20 tablet, Rfl: 0   albuterol (VENTOLIN HFA) 108 (90 Base) MCG/ACT inhaler, Inhale 2 puffs into the lungs every 6 (six) hours as needed for wheezing., Disp: 2 each, Rfl: 11   apixaban (ELIQUIS) 2.5 MG TABS tablet, Take 1 tablet (2.5 mg total) by mouth 2 (two) times daily., Disp: 60 tablet, Rfl: 2   Biotin 5 MG CAPS, Take 5 mg by mouth daily., Disp: , Rfl:    Budeson-Glycopyrrol-Formoterol (BREZTRI AEROSPHERE) 160-9-4.8 MCG/ACT AERO, Inhale 2 puffs into the lungs 2 (two) times daily., Disp: 10.7 g, Rfl: 1   buPROPion (WELLBUTRIN XL) 150 MG 24 hr tablet, TAKE 1 TABLET BY MOUTH EVERY DAY, Disp: 30 tablet, Rfl: 0   Calcium Citrate-Vitamin D (CALCIUM + D PO), Take 1 tablet by mouth daily., Disp: , Rfl:    cefdinir (OMNICEF) 300 MG capsule, Take 1 capsule (300 mg total) by mouth 2 (two) times daily., Disp: 14 capsule, Rfl: 0   Cyanocobalamin (B-12) 2500 MCG TABS, Take 2,500 mcg by mouth daily., Disp: , Rfl:    DULoxetine (CYMBALTA) 30 MG capsule, Take 2 capsules (60 mg total) by mouth daily., Disp: 60 capsule, Rfl: 3    feeding supplement (ENSURE ENLIVE / ENSURE PLUS) LIQD, Take 237 mLs by mouth 2 (two) times daily between meals., Disp: 237 mL, Rfl: 12   fentaNYL (DURAGESIC) 12 MCG/HR, Place 1 patch onto the skin every 3 (three) days., Disp: 10 patch, Rfl: 0   fluconazole (DIFLUCAN) 200 MG tablet, Take 200 mg by mouth daily., Disp: , Rfl:    HYDROcodone-acetaminophen (NORCO) 5-325 MG tablet, Take 1 tablet by mouth every 6 (six) hours as needed for moderate pain., Disp: 30 tablet, Rfl: 0   neomycin-polymyxin b-dexamethasone (MAXITROL) 3.5-10000-0.1 SUSP, Place 1 drop into the  right eye 4 (four) times daily., Disp: , Rfl:    omeprazole (PRILOSEC) 40 MG capsule, TAKE 1 CAPSULE BY MOUTH EVERY DAY BEFORE BREAKFAST, Disp: 90 capsule, Rfl: 1   ondansetron (ZOFRAN-ODT) 4 MG disintegrating tablet, Take 1 tablet (4 mg total) by mouth every 8 (eight) hours as needed for nausea or vomiting., Disp: 20 tablet, Rfl: 0   valACYclovir (VALTREX) 500 MG tablet, TAKE 1 TABLET BY MOUTH TWICE A DAY, Disp: 180 tablet, Rfl: 3  Josephine Igo, DO [facility] 05/23/2023 11:03 AM

## 2023-05-23 NOTE — H&P (View-Only) (Signed)
 Synopsis: Referred in June 2024 for abnormal CT chest by Edstrom, Morgan A, PA  Subjective:   PATIENT ID: Cindy Byrd GENDER: female DOB: 11/09/1941, MRN: [id number]  Chief Complaint  Patient presents with   Consult    Consult for lung nodule.    This is an 82-year-old female, past medical history of esophageal cancer as well as a history of lung cancer treated in 2017 followed by the cancer center.  Also has a history of endometriosis status post hysterectomy, gastroesophageal reflux, hyperlipidemia and hypertension.  Has a history of shortness of breath with exertion and is currently on Breztri for treatment of COPD.  Was referred after having CT of the chest that shows something within the left mainstem concerning for an endobronchial lesion as well as something invading the left pulmonary artery and hilum concerning for malignancy.  Patient is agreeable to proceed with bronchoscopy for evaluation.  Currently anticoagulated.  With the setting of history of DVT.    Past Medical History:  Diagnosis Date   Barrett's esophagus    Bilateral pulmonary embolism (HCC) 09/07/2016   09/02/16 bilateral pulmonary emboli in context of extensive bilateral lower extremity deep venous thromboses Assumed hypercoagulability due to non-small cell metastatic lung cancer Lifelong anticoagulation recommended   Bone neoplasm 06/24/2015   Cancer (HCC)    metastatic poorly differentiated carcinoma. tumor left groin surgical removal with radiation tx.   Cataract    BILATERAL   Cigarette smoker two packs a day or less    Currently still smoking 2 PPD - Not interested in quitting at this time.   Colitis 2017   Colon polyps    hyperplastic, tubular adenomas, tubulovillous adenoma   Cough, persistent    hx. lung cancer ? primary-being evaluated, unsure of primary site.   Depression 06/24/2015   Diverticulosis    DVT of lower extremity, bilateral (HCC) 09/07/2016   Emphysema of lung (HCC)    Endometriosis     Hysterectomy with BSO at age 46 yrs   Esophageal adenocarcinoma (HCC) 08/11/15   intramucosal   Gastritis    GERD (gastroesophageal reflux disease)    H/O: pneumonia    Hiatal hernia    Hyperlipidemia    Hypertension 06/24/2015   likely improved incidental to 40 lbs weight loss from her neoplasm. No Longer taking med for this as of 08-06-15   IBS (irritable bowel syndrome)    Pain    left hip-persistent"tumor of bone"-radiation tx. 10.   Vitamin D deficiency disease      Family History  Problem Relation Age of Onset   Colon cancer Brother    Colon cancer Brother    Stroke Mother    Colon cancer Father    Emphysema Father        smoked   Breast cancer Daughter 42       ER/PR+ stage II     Past Surgical History:  Procedure Laterality Date   ABDOMINAL HYSTERECTOMY     BALLOON DILATION N/A 10/08/2019   Procedure: BALLOON DILATION;  Surgeon: Pyrtle, Jay M, MD;  Location: [facility];  Service: Gastroenterology;  Laterality: N/A;   BARTHOLIN GLAND CYST EXCISION  82 yo ago   Does not want if it was an infected cyst or tumor. Was soon as delivery   BIOPSY  01/02/2019   Procedure: BIOPSY;  Surgeon: Pyrtle, Jay M, MD;  Location: [facility];  Service: Gastroenterology;;   CATARACT EXTRACTION     COLONOSCOPY W/ POLYPECTOMY       multiple times - last done 09/2014 per patient.   ESOPHAGOGASTRODUODENOSCOPY (EGD) WITH PROPOFOL N/A 08/11/2015   Procedure: ESOPHAGOGASTRODUODENOSCOPY (EGD) WITH PROPOFOL;  Surgeon: Jay M Pyrtle, MD;  Location: [facility];  Service: Gastroenterology;  Laterality: N/A;   ESOPHAGOGASTRODUODENOSCOPY (EGD) WITH PROPOFOL N/A 01/02/2019   Procedure: ESOPHAGOGASTRODUODENOSCOPY (EGD) WITH PROPOFOL;  Surgeon: Pyrtle, Jay M, MD;  Location: [facility];  Service: Gastroenterology;  Laterality: N/A;   ESOPHAGOGASTRODUODENOSCOPY (EGD) WITH PROPOFOL N/A 10/08/2019   Procedure: ESOPHAGOGASTRODUODENOSCOPY (EGD) WITH PROPOFOL;  Surgeon: Pyrtle, Jay M, MD;  Location: [facility];  Service: Gastroenterology;  Laterality: N/A;   EYE SURGERY Right 2024   Multiple surgeries over the past year.   FLEXIBLE SIGMOIDOSCOPY N/A 06/24/2017   Procedure: FLEXIBLE SIGMOIDOSCOPY;  Surgeon: Armbruster, Steven Paul, MD;  Location: [facility];  Service: Gastroenterology;  Laterality: N/A;   GANGLION CYST EXCISION     KNEE ARTHROSCOPY  age about 54 yrs   TONSILLECTOMY     TOTAL ABDOMINAL HYSTERECTOMY W/ BILATERAL SALPINGOOPHORECTOMY  at age 46 yrs   For endometriosis    Social History   Socioeconomic History   Marital status: Widowed    Spouse name: Not on file   Number of children: 2   Years of education: Not on file   Highest education level: Not on file  Occupational History   Not on file  Tobacco Use   Smoking status: Former    Packs/day: 1.00    Years: 60.00    Additional pack years: 0.00    Total pack years: 60.00    Types: Cigarettes    Quit date: 12/05/2014    Years since quitting: 8.4    Passive exposure: Never   Smokeless tobacco: Never  Vaping Use   Vaping Use: Never used  Substance and Sexual Activity   Alcohol use: No    Alcohol/week: 0.0 standard drinks of alcohol   Drug use: No   Sexual activity: Not Currently  Other Topics Concern   Not on file  Social History Narrative   Not on file   Social Determinants of Health   Financial Resource Strain: Low Risk  (03/28/2023)   Overall Financial Resource Strain (CARDIA)    Difficulty of Paying Living Expenses: Not hard at all  Food Insecurity: No Food Insecurity (03/28/2023)   Hunger Vital Sign    Worried About Running Out of Food in the Last Year: Never true    Ran Out of Food in the Last Year: Never true  Transportation Needs: No Transportation Needs (03/28/2023)   PRAPARE - Transportation    Lack of Transportation (Medical): No    Lack of Transportation (Non-Medical): No  Physical Activity: Inactive (03/28/2023)   Exercise Vital Sign    Days of Exercise per Week: 0 days    Minutes of  Exercise per Session: 0 min  Stress: No Stress Concern Present (03/28/2023)   [employer] - Occupational Stress Questionnaire    Feeling of Stress : Not at all  Social Connections: Unknown (03/28/2023)   Social Connection and Isolation Panel [NHANES]    Frequency of Communication with Friends and Family: Patient declined    Frequency of Social Gatherings with Friends and Family: Patient declined    Attends Religious Services: Patient declined    Active Member of Clubs or Organizations: Patient declined    Attends Club or Organization Meetings: Patient declined    Marital Status: Widowed  Intimate Partner Violence: Not At Risk (03/28/2023)   Humiliation, Afraid, Rape, and Kick   questionnaire    Fear of Current or Ex-Partner: No    Emotionally Abused: No    Physically Abused: No    Sexually Abused: No     Allergies  Allergen Reactions   Penicillins Other (See Comments)    Unknown; childhood allergy - details unknown Tolerates Ancef   Remeron [Mirtazapine] Other (See Comments)    nightmares   Latex Rash     Outpatient Medications Prior to Visit  Medication Sig Dispense Refill   acetaminophen (TYLENOL) 325 MG tablet Take 2 tablets (650 mg total) by mouth every 6 (six) hours as needed for mild pain (or Fever >/= 101). 20 tablet 0   albuterol (VENTOLIN HFA) 108 (90 Base) MCG/ACT inhaler Inhale 2 puffs into the lungs every 6 (six) hours as needed for wheezing. 2 each 11   apixaban (ELIQUIS) 2.5 MG TABS tablet Take 1 tablet (2.5 mg total) by mouth 2 (two) times daily. 60 tablet 2   Biotin 5 MG CAPS Take 5 mg by mouth daily.     Budeson-Glycopyrrol-Formoterol (BREZTRI AEROSPHERE) 160-9-4.8 MCG/ACT AERO Inhale 2 puffs into the lungs 2 (two) times daily. 10.7 g 1   buPROPion (WELLBUTRIN XL) 150 MG 24 hr tablet TAKE 1 TABLET BY MOUTH EVERY DAY 30 tablet 0   Calcium Citrate-Vitamin D (CALCIUM + D PO) Take 1 tablet by mouth daily.     cefdinir (OMNICEF) 300 MG  capsule Take 1 capsule (300 mg total) by mouth 2 (two) times daily. 14 capsule 0   Cyanocobalamin (B-12) 2500 MCG TABS Take 2,500 mcg by mouth daily.     DULoxetine (CYMBALTA) 30 MG capsule Take 2 capsules (60 mg total) by mouth daily. 60 capsule 3   feeding supplement (ENSURE ENLIVE / ENSURE PLUS) LIQD Take 237 mLs by mouth 2 (two) times daily between meals. 237 mL 12   fentaNYL (DURAGESIC) 12 MCG/HR Place 1 patch onto the skin every 3 (three) days. 10 patch 0   fluconazole (DIFLUCAN) 200 MG tablet Take 200 mg by mouth daily.     HYDROcodone-acetaminophen (NORCO) 5-325 MG tablet Take 1 tablet by mouth every 6 (six) hours as needed for moderate pain. 30 tablet 0   neomycin-polymyxin b-dexamethasone (MAXITROL) 3.5-10000-0.1 SUSP Place 1 drop into the right eye 4 (four) times daily.     omeprazole (PRILOSEC) 40 MG capsule TAKE 1 CAPSULE BY MOUTH EVERY DAY BEFORE BREAKFAST 90 capsule 1   ondansetron (ZOFRAN-ODT) 4 MG disintegrating tablet Take 1 tablet (4 mg total) by mouth every 8 (eight) hours as needed for nausea or vomiting. 20 tablet 0   valACYclovir (VALTREX) 500 MG tablet TAKE 1 TABLET BY MOUTH TWICE A DAY 180 tablet 3   No facility-administered medications prior to visit.    Review of Systems  Constitutional:  Negative for chills, fever, malaise/fatigue and weight loss.  HENT:  Negative for hearing loss, sore throat and tinnitus.   Eyes:  Negative for blurred vision and double vision.  Respiratory:  Positive for shortness of breath. Negative for cough, hemoptysis, sputum production, wheezing and stridor.   Cardiovascular:  Negative for chest pain, palpitations, orthopnea, leg swelling and PND.  Gastrointestinal:  Negative for abdominal pain, constipation, diarrhea, heartburn, nausea and vomiting.  Genitourinary:  Negative for dysuria, hematuria and urgency.  Musculoskeletal:  Negative for joint pain and myalgias.  Skin:  Negative for itching and rash.  Neurological:  Negative for  dizziness, tingling, weakness and headaches.  Endo/Heme/Allergies:  Negative for environmental allergies. Does not bruise/bleed easily.    Psychiatric/Behavioral:  Negative for depression. The patient is not nervous/anxious and does not have insomnia.   All other systems reviewed and are negative.    Objective:  Physical Exam Vitals reviewed.  Constitutional:      General: She is not in acute distress.    Appearance: She is well-developed.  HENT:     Head: Normocephalic and atraumatic.  Eyes:     General: No scleral icterus.    Conjunctiva/sclera: Conjunctivae normal.     Pupils: Pupils are equal, round, and reactive to light.  Neck:     Vascular: No JVD.     Trachea: No tracheal deviation.  Cardiovascular:     Rate and Rhythm: Normal rate and regular rhythm.     Heart sounds: Normal heart sounds. No murmur heard. Pulmonary:     Effort: Pulmonary effort is normal. No tachypnea, accessory muscle usage or respiratory distress.     Breath sounds: No stridor. No wheezing, rhonchi or rales.  Abdominal:     General: There is no distension.     Palpations: Abdomen is soft.     Tenderness: There is no abdominal tenderness.  Musculoskeletal:        General: No tenderness.     Cervical back: Neck supple.  Lymphadenopathy:     Cervical: No cervical adenopathy.  Skin:    General: Skin is warm and dry.     Capillary Refill: Capillary refill takes less than 2 seconds.     Findings: No rash.  Neurological:     Mental Status: She is alert and oriented to person, place, and time.  Psychiatric:        Behavior: Behavior normal.      Vitals:   05/23/23 1013  BP: 138/70  Pulse: 81  SpO2: 94%  Weight: 140 lb 12.8 oz (63.9 kg)  Height: 5' 8" (1.727 m)   94% on RA BMI Readings from Last 3 Encounters:  05/23/23 21.41 kg/m  05/09/23 21.50 kg/m  05/02/23 21.59 kg/m   Wt Readings from Last 3 Encounters:  05/23/23 140 lb 12.8 oz (63.9 kg)  05/09/23 141 lb 6 oz (64.1 kg)   05/02/23 142 lb (64.4 kg)     CBC    Component Value Date/Time   WBC 9.8 04/14/2023 1144   WBC 10.7 (H) 09/11/2022 0322   RBC 3.95 04/14/2023 1144   HGB 12.5 04/14/2023 1144   HGB 15.0 06/20/2022 1036   HGB 11.5 (L) 11/16/2017 1422   HCT 37.5 04/14/2023 1144   HCT 44.6 06/20/2022 1036   HCT 37.1 11/16/2017 1422   PLT 310 04/14/2023 1144   PLT 354 06/20/2022 1036   MCV 94.9 04/14/2023 1144   MCV 90 06/20/2022 1036   MCV 90.9 11/16/2017 1422   MCH 31.6 04/14/2023 1144   MCHC 33.3 04/14/2023 1144   RDW 15.3 04/14/2023 1144   RDW 14.5 06/20/2022 1036   RDW 14.1 11/16/2017 1422   LYMPHSABS 2.4 04/14/2023 1144   LYMPHSABS 2.7 06/20/2022 1036   LYMPHSABS 1.6 11/16/2017 1422   MONOABS 0.8 04/14/2023 1144   MONOABS 0.3 11/16/2017 1422   EOSABS 0.1 04/14/2023 1144   EOSABS 0.2 06/20/2022 1036   BASOSABS 0.1 04/14/2023 1144   BASOSABS 0.1 06/20/2022 1036   BASOSABS 0.0 11/16/2017 1422     Chest Imaging: 05/04/2023 CTA chest: Patient with a left hilar lesion and possible endobronchial lesion in the left mainstem concerning for malignancy. The patient's images have been independently reviewed by me.    Pulmonary   Functions Testing Results:     No data to display          FeNO:   Pathology:   Echocardiogram:   Heart Catheterization:     Assessment & Plan:     ICD-10-CM   1. Lung mass  R91.8 Procedural/ Surgical Case Request: VIDEO BRONCHOSCOPY WITHOUT FLUORO, VIDEO BRONCHOSCOPY WITH ENDOBRONCHIAL ULTRASOUND    Ambulatory referral to Pulmonology    2. Primary malignant neoplasm of lung metastatic to other site, unspecified laterality (HCC)  C34.90       Discussion: This is a 82-year-old female with a past medical history of COPD on triple therapy inhaler regimen, history of DVT on apixaban.  Patient has a history of lung cancer on the left with metastasis is well as history of esophageal malignancy.  Presents with concern for endobronchial lesion on the left  side and concern for recurrence based on CT imaging.  Plan: Today in the office we talked about bronchoscopy and biopsy for evaluation of what ever is in the left mainstem. We talked about the possibility of this being mucous and secretions as well however due to the what appears to be small lesion in the left hilum encroaching the pulmonary artery the concern is there is underlying malignancy and recurrence. Will plan for videobronchoscopy with possible cryotherapy for endobronchial lesion at or videobronchoscopy endobronchial ultrasound and biopsy of the left hilar lesion. Patient is agreeable to proceed. Will need to stop Eliquis 2 full days prior. Tentative bronchoscopy date will be on 06/06/2023.   Current Outpatient Medications:    acetaminophen (TYLENOL) 325 MG tablet, Take 2 tablets (650 mg total) by mouth every 6 (six) hours as needed for mild pain (or Fever >/= 101)., Disp: 20 tablet, Rfl: 0   albuterol (VENTOLIN HFA) 108 (90 Base) MCG/ACT inhaler, Inhale 2 puffs into the lungs every 6 (six) hours as needed for wheezing., Disp: 2 each, Rfl: 11   apixaban (ELIQUIS) 2.5 MG TABS tablet, Take 1 tablet (2.5 mg total) by mouth 2 (two) times daily., Disp: 60 tablet, Rfl: 2   Biotin 5 MG CAPS, Take 5 mg by mouth daily., Disp: , Rfl:    Budeson-Glycopyrrol-Formoterol (BREZTRI AEROSPHERE) 160-9-4.8 MCG/ACT AERO, Inhale 2 puffs into the lungs 2 (two) times daily., Disp: 10.7 g, Rfl: 1   buPROPion (WELLBUTRIN XL) 150 MG 24 hr tablet, TAKE 1 TABLET BY MOUTH EVERY DAY, Disp: 30 tablet, Rfl: 0   Calcium Citrate-Vitamin D (CALCIUM + D PO), Take 1 tablet by mouth daily., Disp: , Rfl:    cefdinir (OMNICEF) 300 MG capsule, Take 1 capsule (300 mg total) by mouth 2 (two) times daily., Disp: 14 capsule, Rfl: 0   Cyanocobalamin (B-12) 2500 MCG TABS, Take 2,500 mcg by mouth daily., Disp: , Rfl:    DULoxetine (CYMBALTA) 30 MG capsule, Take 2 capsules (60 mg total) by mouth daily., Disp: 60 capsule, Rfl: 3    feeding supplement (ENSURE ENLIVE / ENSURE PLUS) LIQD, Take 237 mLs by mouth 2 (two) times daily between meals., Disp: 237 mL, Rfl: 12   fentaNYL (DURAGESIC) 12 MCG/HR, Place 1 patch onto the skin every 3 (three) days., Disp: 10 patch, Rfl: 0   fluconazole (DIFLUCAN) 200 MG tablet, Take 200 mg by mouth daily., Disp: , Rfl:    HYDROcodone-acetaminophen (NORCO) 5-325 MG tablet, Take 1 tablet by mouth every 6 (six) hours as needed for moderate pain., Disp: 30 tablet, Rfl: 0   neomycin-polymyxin b-dexamethasone (MAXITROL) 3.5-10000-0.1 SUSP, Place 1 drop into the   right eye 4 (four) times daily., Disp: , Rfl:    omeprazole (PRILOSEC) 40 MG capsule, TAKE 1 CAPSULE BY MOUTH EVERY DAY BEFORE BREAKFAST, Disp: 90 capsule, Rfl: 1   ondansetron (ZOFRAN-ODT) 4 MG disintegrating tablet, Take 1 tablet (4 mg total) by mouth every 8 (eight) hours as needed for nausea or vomiting., Disp: 20 tablet, Rfl: 0   valACYclovir (VALTREX) 500 MG tablet, TAKE 1 TABLET BY MOUTH TWICE A DAY, Disp: 180 tablet, Rfl: 3  Cindy Bennetts L Reiana Poteet, DO [facility] 05/23/2023 11:03 AM    

## 2023-05-24 ENCOUNTER — Telehealth: Payer: Self-pay

## 2023-05-24 ENCOUNTER — Other Ambulatory Visit: Payer: Self-pay

## 2023-05-24 NOTE — Telephone Encounter (Signed)
Patient identity verified x2 and delivered the message below.  Per Laurence Aly PA-C... We saw Dr. Myrlene Broker plan for 7/2 biopsy. I have asked simulation to call her to schedule sim the week after her biopsy, we can always change this, but just want to put a place holder on the schedule for her.   Patient verbalized understanding of the message above.   This concludes the interview.   Ruel Favors, LPN

## 2023-05-26 ENCOUNTER — Encounter: Payer: Self-pay | Admitting: Hematology

## 2023-05-26 DIAGNOSIS — H168 Other keratitis: Secondary | ICD-10-CM | POA: Diagnosis not present

## 2023-05-26 MED ORDER — FENTANYL 12 MCG/HR TD PT72: 1.0000 | MEDICATED_PATCH | TRANSDERMAL | 0 refills | Status: DC

## 2023-05-29 ENCOUNTER — Ambulatory Visit (INDEPENDENT_AMBULATORY_CARE_PROVIDER_SITE_OTHER): Payer: Medicare Other | Admitting: Family Medicine

## 2023-05-29 ENCOUNTER — Encounter: Payer: Self-pay | Admitting: Family Medicine

## 2023-05-29 VITALS — BP 156/77 | HR 71 | Resp 18 | Ht 68.0 in | Wt 143.0 lb

## 2023-05-29 DIAGNOSIS — R82998 Other abnormal findings in urine: Secondary | ICD-10-CM

## 2023-05-29 DIAGNOSIS — N3001 Acute cystitis with hematuria: Secondary | ICD-10-CM

## 2023-05-29 DIAGNOSIS — I10 Essential (primary) hypertension: Secondary | ICD-10-CM

## 2023-05-29 DIAGNOSIS — R319 Hematuria, unspecified: Secondary | ICD-10-CM | POA: Diagnosis not present

## 2023-05-29 DIAGNOSIS — R300 Dysuria: Secondary | ICD-10-CM

## 2023-05-29 LAB — POCT URINALYSIS DIP (CLINITEK)
Bilirubin, UA: NEGATIVE
Glucose, UA: NEGATIVE mg/dL
Ketones, POC UA: NEGATIVE mg/dL
Nitrite, UA: NEGATIVE
POC PROTEIN,UA: 30 — AB
Spec Grav, UA: 1.02 (ref 1.010–1.025)
Urobilinogen, UA: 0.2 E.U./dL
pH, UA: 6 (ref 5.0–8.0)

## 2023-05-29 MED ORDER — ONDANSETRON 4 MG PO TBDP: 4.0000 mg | ORAL_TABLET | Freq: Three times a day (TID) | ORAL | 0 refills | Status: AC | PRN

## 2023-05-29 MED ORDER — CIPROFLOXACIN HCL 500 MG PO TABS
500.0000 mg | ORAL_TABLET | Freq: Two times a day (BID) | ORAL | 0 refills | Status: AC
Start: 2023-05-29 — End: 2023-06-05

## 2023-05-29 NOTE — Assessment & Plan Note (Signed)
Blood pressure elevated today 152/81, remained elevated on recheck 156/77.  Blood pressure has been elevated at office visits recently.  At next appointment, if still elevated recommend starting valsartan 80 mg daily.

## 2023-05-29 NOTE — Progress Notes (Signed)
Acute Office Visit  Subjective:     Patient ID: Cindy Byrd, female    DOB: Apr 06, 1941, 82 y.o.   MRN: [id number]  Chief Complaint  Patient presents with   Urinary Tract Infection    HPI Patient is in today for possible UTI.  She endorses 8/10 dysuria, increased urinary urgency and frequency.  She also endorses lower back and flank pain bilaterally.  She denies abdominal pain, fever/chills.  She states that this feels worse than her last UTI which was initially treated with Bactrim but switched to cefdinir after urine culture results came back.  She did not tolerate cefdinir well.  ROS Negative unless otherwise noted in HPI    Objective:    BP (!) 156/77 (BP Location: Right Arm, Patient Position: Sitting, Cuff Size: Normal)   Pulse 71   Resp 18   Ht 5\' 8"  (1.727 m)   Wt 143 lb (64.9 kg)   SpO2 94%   BMI 21.74 kg/m   Physical Exam Constitutional:      General: She is not in acute distress.    Appearance: Normal appearance.  HENT:     Head: Normocephalic and atraumatic.  Cardiovascular:     Rate and Rhythm: Normal rate and regular rhythm.     Heart sounds: No murmur heard.    No friction rub. No gallop.  Pulmonary:     Effort: Pulmonary effort is normal. No respiratory distress.     Breath sounds: No wheezing, rhonchi or rales.  Abdominal:     General: Abdomen is flat. Bowel sounds are normal. There is no distension.     Palpations: There is no mass.     Tenderness: There is abdominal tenderness (Mild TTP RLQ). There is no right CVA tenderness, left CVA tenderness, guarding or rebound.  Skin:    General: Skin is warm and dry.  Neurological:     Mental Status: She is alert and oriented to person, place, and time.    Results for orders placed or performed in visit on 05/29/23  POCT URINALYSIS DIP (CLINITEK)  Result Value Ref Range   Color, UA yellow yellow   Clarity, UA cloudy (A) clear   Glucose, UA negative negative mg/dL   Bilirubin, UA negative  negative   Ketones, POC UA negative negative mg/dL   Spec Grav, UA 0.272 5.366 - 1.025   Blood, UA small (A) negative   pH, UA 6.0 5.0 - 8.0   POC PROTEIN,UA =30 (A) negative, trace   Urobilinogen, UA 0.2 0.2 or 1.0 E.U./dL   Nitrite, UA Negative Negative   Leukocytes, UA Small (1+) (A) Negative     Assessment & Plan:  Dysuria -     POCT URINALYSIS DIP (CLINITEK) -     Urine Culture -     Ciprofloxacin HCl; Take 1 tablet (500 mg total) by mouth 2 (two) times daily for 7 days.  Dispense: 14 tablet; Refill: 0  Hematuria, unspecified type -     Urine Culture -     Ciprofloxacin HCl; Take 1 tablet (500 mg total) by mouth 2 (two) times daily for 7 days.  Dispense: 14 tablet; Refill: 0  Leukocytes in urine -     Urine Culture -     Ciprofloxacin HCl; Take 1 tablet (500 mg total) by mouth 2 (two) times daily for 7 days.  Dispense: 14 tablet; Refill: 0  Acute cystitis with hematuria -     Ciprofloxacin HCl; Take 1 tablet (500 mg total)  by mouth 2 (two) times daily for 7 days.  Dispense: 14 tablet; Refill: 0  Primary hypertension Assessment & Plan: Blood pressure elevated today 152/81, remained elevated on recheck 156/77.  Blood pressure has been elevated at office visits recently.  At next appointment, if still elevated recommend starting valsartan 80 mg daily.   Urinalysis positive for leukocytes and hematuria.  Reviewed past 2 blood cultures for susceptibility, starting 7-day course of ciprofloxacin 500 mg twice daily.  Other options based on previous cultures included gentamicin, tobramycin.  Starting ciprofloxacin for broader coverage given flank pain along with dysuria.  Will adjust regimen as indicated by results of culture.  Return in about 6 weeks (around 07/10/2023) for follow-up for pain management.  Melida Quitter, PA

## 2023-05-29 NOTE — Progress Notes (Signed)
Contacted pt to set up MD appointment for 06/19/23 1:30 labs and 2 pm MD appointment. Pt acknowledged appointment date and time.

## 2023-05-29 NOTE — Addendum Note (Signed)
Addended by: Saralyn Pilar on: 05/29/2023 01:07 PM   Modules accepted: Orders

## 2023-05-31 LAB — URINE CULTURE

## 2023-06-02 ENCOUNTER — Encounter (HOSPITAL_COMMUNITY): Payer: Self-pay | Admitting: Pulmonary Disease

## 2023-06-02 ENCOUNTER — Other Ambulatory Visit: Payer: Self-pay

## 2023-06-02 LAB — URINE CULTURE

## 2023-06-02 NOTE — Progress Notes (Signed)
SDW CALL  Patient was given pre-op instructions over the phone. The opportunity was given for the patient to ask questions. No further questions asked. Patient verbalized understanding of instructions given.   PCP - Carlton Adam Cardiologist - denies  PPM/ICD - denies Device Orders -  Rep Notified -   Chest x-ray - CT-05/04/23 EKG - 09/04/22 Stress Test -  ECHO - 03/14/19 Cardiac Cath - none  Sleep Study - denies CPAP - no  Fasting Blood Sugar - na Checks Blood Sugar _____ times a day  Blood Thinner Instructions: pt states last dose of Eliquis will be 6/29 per Dr. Myrlene Broker instructions.  Aspirin Instructions:na  ERAS Protcol -no PRE-SURGERY Ensure or G2-   COVID TEST- na   Anesthesia review: yes-HTN- EKG -09/04/22- prolonged QT  Patient denies shortness of breath, fever, cough and chest pain over the phone call   Surgical Instructions   Your procedure is scheduled on Tuesday July 2.  Report to [facility] Main Entrance "A" at 1115 A.M., then check in with the Admitting office.  Call this number if you have problems the morning of surgery:  [phone]   Remember:  Do not eat or drink anything after midnight the night before your surgery   Take these medicines the morning of surgery with A SIP OF WATER: Breztri,Wellbutrin,Cymbalta, Prilosec,Diflucan,Maxitrol,Valtrex. If needed take Tylenol or Norco,Zofran,Albuterol. Please bring inhaler to the hospital with you.   As of today, STOP taking any Aspirin (unless otherwise instructed by your surgeon) Aleve, Naproxen, Ibuprofen, Motrin, Advil, Goody's, BC's, all herbal medications, fish oil, and all vitamins.  [facility] is not responsible for any belongings or valuables. .   Do NOT Smoke (Tobacco/Vaping)  24 hours prior to your procedure  If you use a CPAP at night, you may bring your mask for your overnight stay.   Contacts, glasses, hearing aids, dentures or partials may not be worn into surgery, please bring  cases for these belongings   Patients discharged the day of surgery will not be allowed to drive home, and someone needs to stay with them for 24 hours.   Special instructions:    Oral Hygiene is also important to reduce your risk of infection.  Remember - BRUSH YOUR TEETH THE MORNING OF SURGERY WITH YOUR REGULAR TOOTHPASTE   Day of Surgery:  Take a shower the day of or night before with antibacterial soap. Wear Clean/Comfortable clothing the morning of surgery Do not apply any deodorants/lotions.   Do not wear jewelry or makeup Do not wear lotions, powders, perfumes/colognes, or deodorant. Do not shave 48 hours prior to surgery.  Men may shave face and neck. Do not bring valuables to the hospital. Do not wear nail polish, gel polish, artificial nails, or any other type of covering on natural nails (fingers and toes) If you have artificial nails or gel coating that need to be removed by a nail salon, please have this removed prior to surgery. Artificial nails or gel coating may interfere with anesthesia's ability to adequately monitor your vital signs. Remember to brush your teeth WITH YOUR REGULAR TOOTHPASTE.

## 2023-06-04 DIAGNOSIS — M6281 Muscle weakness (generalized): Secondary | ICD-10-CM | POA: Diagnosis not present

## 2023-06-05 ENCOUNTER — Encounter (HOSPITAL_COMMUNITY): Payer: Self-pay | Admitting: Pulmonary Disease

## 2023-06-05 NOTE — Anesthesia Preprocedure Evaluation (Addendum)
Anesthesia Evaluation  Patient identified by MRN, date of birth, ID band Patient awake    Reviewed: Allergy & Precautions, NPO status , Patient's Chart, lab work & pertinent test results  History of Anesthesia Complications Negative for: history of anesthetic complications  Airway Mallampati: III  TM Distance: >3 FB Neck ROM: Full    Dental  (+) Partial Upper, Partial Lower   Pulmonary neg shortness of breath, neg sleep apnea, COPD,  COPD inhaler, neg recent URI, former smoker, PE (2017) Lung cancer   Pulmonary exam normal breath sounds clear to auscultation       Cardiovascular hypertension, (-) angina + DVT (2017)  (-) Past MI, (-) Cardiac Stents and (-) CABG + dysrhythmias (prolonged QT) Atrial Fibrillation  Rhythm:Regular Rate:Normal  HLD  TTE 03/14/2019: IMPRESSIONS    1. The left ventricle has normal systolic function with an ejection  fraction of 60-65%. The cavity size was normal. Left ventricular diastolic  Doppler parameters are consistent with impaired relaxation.   2. Normal GLS -22.2.   3. The right ventricle has normal systolic function. The cavity was  normal. There is no increase in right ventricular wall thickness.   4. The aortic valve was not well visualized. Aortic valve regurgitation  is trivial by color flow Doppler.     Neuro/Psych neg Seizures PSYCHIATRIC DISORDERS Anxiety Depression       GI/Hepatic Neg liver ROS, hiatal hernia,GERD (Barrett's esophagus)  Medicated,,IBS, esophageal cancer, diverticulosis   Endo/Other  negative endocrine ROS    Renal/GU negative Renal ROS     Musculoskeletal   Abdominal   Peds  Hematology negative hematology ROS (+)   Anesthesia Other Findings Last Eliquis: 06/02/2023  Shingles over right eye. Has had recent eye surgery. Right eye is sewn shut.  Has fentanyl patch on right upper back.  Reproductive/Obstetrics endometriosis                              Anesthesia Physical Anesthesia Plan  ASA: 3  Anesthesia Plan: General   Post-op Pain Management:    Induction: Intravenous  PONV Risk Score and Plan: 3 and Ondansetron, Dexamethasone, Propofol infusion, TIVA and Treatment may vary due to age or medical condition  Airway Management Planned: Oral ETT  Additional Equipment:   Intra-op Plan:   Post-operative Plan: Extubation in OR  Informed Consent: I have reviewed the patients History and Physical, chart, labs and discussed the procedure including the risks, benefits and alternatives for the proposed anesthesia with the patient or authorized representative who has indicated his/her understanding and acceptance.     Dental advisory given  Plan Discussed with: CRNA and Anesthesiologist  Anesthesia Plan Comments: (PAT note written 06/05/2023 by Shonna Chock, PA-C.  Risks of general anesthesia discussed including, but not limited to, sore throat, hoarse voice, chipped/damaged teeth, injury to vocal cords, nausea and vomiting, allergic reactions, lung infection, heart attack, stroke, and death. All questions answered.   )       Anesthesia Quick Evaluation

## 2023-06-05 NOTE — Progress Notes (Signed)
Anesthesia Chart Review: SAME DAY WORK-UP  Case: [id number] Date/Time: 06/06/23 1345   Procedures:      VIDEO BRONCHOSCOPY WITHOUT FLUORO (Bilateral)     VIDEO BRONCHOSCOPY WITH ENDOBRONCHIAL ULTRASOUND (Bilateral)   Anesthesia type: General   Diagnosis: Lung mass [R91.8]   Pre-op diagnosis: lung mass, adenopathy   [facility] / [facility]   Surgeons: Josephine Igo, DO       DISCUSSION: Patient is an 82 year old female scheduled for the above procedure.  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   [facility] admission 09/04/22 - 09/12/22 for fall, Klebsiella pneumonia UTI, on-going treatment for herpes zoster ophthalmicus RIGHT eye.   Her lung and esophageal cancers have been followed at the [facility] and GI. She was referred to pulmonologist Dr. Tonia Brooms by oncologist Dr. Candise Che in June after 05/04/23 CTA chest showed a new left hilar lesion that narrows the LLL pulmonary artery and focally occludes the LU lobar pulmonary artery and possible endobronchial lesion in the left mainstem. Findings concerning for recurrent malignancy. She also had a web-like chronic residual of PE in the RUL pulmonary artery and segmental branches, but otherwise not evidence of PE.  She had previously been on Xarelto for BLE DVT and submassive PE 08/2016 but was not longer on due to cost. She was started on Eliquis 05/07/23 by Dr. Candise Che based on 04/24/23 CT results  Per 05/23/23 note by Dr. Tonia Brooms, "Today in the office we talked about bronchoscopy and biopsy for evaluation of what ever is in the left mainstem. We talked about the possibility of this being mucous and secretions as well however due to the what appears to be small lesion in the left hilum encroaching the pulmonary artery the concern is there is underlying malignancy and recurrence. Will plan for videobronchoscopy with possible cryotherapy for endobronchial lesion at or videobronchoscopy endobronchial ultrasound and biopsy of the left hilar lesion. Patient is agreeable to proceed. Will need to stop Eliquis 2 full days prior. Tentative bronchoscopy date will be on 06/06/2023."  She reported last dose of Eliquis scheduled for 06/03/2023. Anesthesia team to evaluate on the day of surgery.  She is on Cipro for Proteus mirabilis UTI on 05/29/23 urine culture.  A1c 7.4% 06/20/22 suggestive of DM. Provider notes at the times suggestive recent steroids, infection may have played a role. She is not on any diabetic medications. She is for labs on the day of surgery.    VS:  BP Readings from Last 3 Encounters:  05/29/23 (!) 156/77  05/23/23 138/70  05/09/23 (!) 175/80   Pulse Readings from Last 3 Encounters:  05/29/23 71  05/23/23 81  05/09/23 93     PROVIDERS: Melida Quitter, PA is PCP  Wyvonnia Lora, MD is HEM-ON Dorothy Puffer, MD is RAD-ONC Erick Blinks, MD is GI   LABS: Most recent labs in [facility] include: Lab Results  Component  Value Date   WBC 9.8 04/14/2023   HGB 12.5 04/14/2023   HCT 37.5 04/14/2023   PLT 310 04/14/2023   GLUCOSE 204 (H) 04/14/2023   CHOL 191 06/20/2022   TRIG 243 (H) 06/20/2022   HDL 40 06/20/2022   LDLCALC 109 (H) 06/20/2022   ALT 8 04/14/2023   AST 18 04/14/2023   NA 140 04/14/2023    K 3.3 (L) 04/14/2023   CL 106 04/14/2023   CREATININE 1.04 (H) 04/14/2023   BUN 13 04/14/2023   CO2 26 04/14/2023   TSH 2.740 12/13/2019   INR 1.44 09/02/2016   HGBA1C 7.4 (H) 06/20/2022     IMAGES: CTA Chest 05/04/23: IMPRESSION: 1. New soft tissue in the left hilum which narrows the left lower lobar pulmonary artery measuring 2.2 x 1.4 cm, and which focally occludes the left upper lobar pulmonary artery measuring 1.6 x 1.3 cm. Findings are highly concerning for recurrent malignancy. 2. Fluid attenuation endobronchial nodule within the proximal left mainstem bronchus measuring 1.1 x 0.7 cm, possibly secretions although malignant recurrence not excluded. 3. Web-like, chronic residua of pulmonary embolus in the right upper lobe pulmonary artery and segmental branch vessels. No other evidence of pulmonary embolism.   CT Abd/pelvis 05/04/23: IMPRESSION: 1. Unchanged large mixed lytic and sclerotic osseous metastasis of the left ilium. 2. No evidence of lymphadenopathy or other metastatic disease in the abdomen or pelvis. 3. Long segment wall thickening and mucosal hyperenhancement of the decompressed transverse colon, consistent with nonspecific infectious, inflammatory, or ischemic colitis. 4. Atrophic appearance of the spleen with capsular retraction, new compared to prior examination. This is consistent with expected evolution of splenic infarction which in retrospect was seen acutely on examination dated 05/12/2022. 5. Cholelithiasis. - Aortic Atherosclerosis (ICD10-I70.0).  CT Head/C-spine 09/04/22: IMPRESSION: - No evidence of acute intracranial abnormality. Atrophy with small vessel ischemic changes. - No evidence of traumatic injury to the cervical spine. Mild multilevel degenerative changes.    EKG: EKG 09/04/22 23:37:34: Sinus or ectopic atrial rhythm Borderline T abnormalities, anterior leads Prolonged QT interval (QT 420, QTcB 520 ms)  CV: Echo  03/14/19: IMPRESSIONS   1. The left ventricle has normal systolic function with an ejection  fraction of 60-65%. The cavity size was normal. Left ventricular diastolic  Doppler parameters are consistent with impaired relaxation.   2. Normal GLS -22.2.   3. The right ventricle has normal systolic function. The cavity was  normal. There is no increase in right ventricular wall thickness.   4. The aortic valve was not well visualized. Aortic valve regurgitation  is trivial by color flow Doppler.    Past Medical History:  Diagnosis Date   Barrett's esophagus    Bilateral pulmonary embolism (HCC) 09/02/2016   09/02/16 bilateral pulmonary emboli in context of extensive bilateral lower extremity deep venous thromboses Assumed hypercoagulability due to non-small cell metastatic lung cancer Lifelong anticoagulation recommended   Bone neoplasm 06/24/2015   Cancer (HCC)    metastatic poorly differentiated carcinoma. tumor left groin surgical removal with radiation tx.   Cataract    BILATERAL   Cigarette smoker two packs a day or less    Currently still smoking 2 PPD - Not interested in quitting at this time.   Colitis 2017   Colon polyps    hyperplastic, tubular adenomas, tubulovillous adenoma   Cough, persistent    hx. lung cancer ? primary-being evaluated, unsure of primary site.   Depression 06/24/2015   Diverticulosis    DVT of lower extremity,  bilateral (HCC) 09/02/2016   Dysrhythmia    PAF in setting of submassive PE 09/2016   Emphysema of lung (HCC)    Endometriosis    Hysterectomy with BSO at age 27 yrs   Esophageal adenocarcinoma (HCC) 08/11/2015   intramucosal   Gastritis    GERD (gastroesophageal reflux disease)    H/O: pneumonia    Hiatal hernia    Hyperglycemia    A1c 7.4% 06/20/22   Hyperlipidemia    Hypertension 06/24/2015   likely improved incidental to 40 lbs weight loss from her neoplasm. No Longer taking med for this as of 08-06-15   IBS (irritable bowel syndrome)     Lung cancer (HCC)    suspected IV lung cancer with left ileum mets 2017, s/p left ileum radiaiton, immunotherapy   Pain    left hip-persistent"tumor of bone"-radiation tx. 10.   Pneumonia    Vitamin D deficiency disease     Past Surgical History:  Procedure Laterality Date   ABDOMINAL HYSTERECTOMY     BALLOON DILATION N/A 10/08/2019   Procedure: BALLOON DILATION;  Surgeon: Beverley Fiedler, MD;  Location: [facility];  Service: Gastroenterology;  Laterality: N/A;   BARTHOLIN GLAND CYST EXCISION  82 yo ago   Does not want if it was an infected cyst or tumor. Was soon as delivery   BIOPSY  01/02/2019   Procedure: BIOPSY;  Surgeon: Beverley Fiedler, MD;  Location: [facility];  Service: Gastroenterology;;   CATARACT EXTRACTION     COLONOSCOPY W/ POLYPECTOMY     multiple times - last done 09/2014 per patient.   ESOPHAGOGASTRODUODENOSCOPY (EGD) WITH PROPOFOL N/A 08/11/2015   Procedure: ESOPHAGOGASTRODUODENOSCOPY (EGD) WITH PROPOFOL;  Surgeon: Beverley Fiedler, MD;  Location: [facility];  Service: Gastroenterology;  Laterality: N/A;   ESOPHAGOGASTRODUODENOSCOPY (EGD) WITH PROPOFOL N/A 01/02/2019   Procedure: ESOPHAGOGASTRODUODENOSCOPY (EGD) WITH PROPOFOL;  Surgeon: Beverley Fiedler, MD;  Location: [facility];  Service: Gastroenterology;  Laterality: N/A;   ESOPHAGOGASTRODUODENOSCOPY (EGD) WITH PROPOFOL N/A 10/08/2019   Procedure: ESOPHAGOGASTRODUODENOSCOPY (EGD) WITH PROPOFOL;  Surgeon: Beverley Fiedler, MD;  Location: [facility];  Service: Gastroenterology;  Laterality: N/A;   EYE SURGERY Right 2024   Multiple surgeries over the past year.   FLEXIBLE SIGMOIDOSCOPY N/A 06/24/2017   Procedure: FLEXIBLE SIGMOIDOSCOPY;  Surgeon: Ruffin Frederick, MD;  Location: [facility];  Service: Gastroenterology;  Laterality: N/A;   GANGLION CYST EXCISION     KNEE ARTHROSCOPY  age about 63 yrs   TONSILLECTOMY     TOTAL ABDOMINAL HYSTERECTOMY W/ BILATERAL SALPINGOOPHORECTOMY  at age 50 yrs   For  endometriosis    MEDICATIONS: No current facility-administered medications for this encounter.    acetaminophen (TYLENOL) 325 MG tablet   acidophilus (RISAQUAD) CAPS capsule   albuterol (VENTOLIN HFA) 108 (90 Base) MCG/ACT inhaler   apixaban (ELIQUIS) 2.5 MG TABS tablet   Budeson-Glycopyrrol-Formoterol (BREZTRI AEROSPHERE) 160-9-4.8 MCG/ACT AERO   buPROPion (WELLBUTRIN XL) 150 MG 24 hr tablet   ciprofloxacin (CIPRO) 500 MG tablet   Cyanocobalamin (B-12) 2500 MCG TABS   DULoxetine (CYMBALTA) 30 MG capsule   fentaNYL (DURAGESIC) 12 MCG/HR   HYDROcodone-acetaminophen (NORCO) 5-325 MG tablet   neomycin-polymyxin b-dexamethasone (MAXITROL) 3.5-10000-0.1 SUSP   omeprazole (PRILOSEC) 40 MG capsule   ondansetron (ZOFRAN-ODT) 4 MG disintegrating tablet   valACYclovir (VALTREX) 500 MG tablet   feeding supplement (ENSURE ENLIVE / ENSURE PLUS) LIQD    Shonna Chock, PA-C Surgical Short Stay/Anesthesiology [facility] Phone [phone] [facility] Phone [phone] 06/05/2023 11:40 AM

## 2023-06-06 ENCOUNTER — Ambulatory Visit (HOSPITAL_COMMUNITY)
Admission: RE | Admit: 2023-06-06 | Discharge: 2023-06-06 | Disposition: A | Discharge status: Home / Self Care | Payer: Medicare Other | Attending: Pulmonary Disease | Admitting: Pulmonary Disease

## 2023-06-06 ENCOUNTER — Encounter (HOSPITAL_COMMUNITY)
Admission: RE | Disposition: A | Discharge status: Home / Self Care | Payer: Self-pay | Source: Home / Self Care | Attending: Pulmonary Disease

## 2023-06-06 ENCOUNTER — Encounter (HOSPITAL_COMMUNITY): Payer: Self-pay | Admitting: Pulmonary Disease

## 2023-06-06 ENCOUNTER — Ambulatory Visit (HOSPITAL_BASED_OUTPATIENT_CLINIC_OR_DEPARTMENT_OTHER): Payer: Medicare Other | Admitting: Vascular Surgery

## 2023-06-06 ENCOUNTER — Telehealth: Payer: Self-pay | Admitting: *Deleted

## 2023-06-06 ENCOUNTER — Other Ambulatory Visit: Payer: Self-pay

## 2023-06-06 ENCOUNTER — Ambulatory Visit (HOSPITAL_COMMUNITY): Payer: Medicare Other | Admitting: Vascular Surgery

## 2023-06-06 ENCOUNTER — Other Ambulatory Visit: Payer: Self-pay | Admitting: Radiation Oncology

## 2023-06-06 DIAGNOSIS — K449 Diaphragmatic hernia without obstruction or gangrene: Secondary | ICD-10-CM | POA: Diagnosis not present

## 2023-06-06 DIAGNOSIS — E785 Hyperlipidemia, unspecified: Secondary | ICD-10-CM | POA: Diagnosis not present

## 2023-06-06 DIAGNOSIS — Z7901 Long term (current) use of anticoagulants: Secondary | ICD-10-CM | POA: Diagnosis not present

## 2023-06-06 DIAGNOSIS — R918 Other nonspecific abnormal finding of lung field: Secondary | ICD-10-CM

## 2023-06-06 DIAGNOSIS — F419 Anxiety disorder, unspecified: Secondary | ICD-10-CM | POA: Insufficient documentation

## 2023-06-06 DIAGNOSIS — B964 Proteus (mirabilis) (morganii) as the cause of diseases classified elsewhere: Secondary | ICD-10-CM | POA: Insufficient documentation

## 2023-06-06 DIAGNOSIS — R590 Localized enlarged lymph nodes: Secondary | ICD-10-CM

## 2023-06-06 DIAGNOSIS — Z85118 Personal history of other malignant neoplasm of bronchus and lung: Secondary | ICD-10-CM | POA: Insufficient documentation

## 2023-06-06 DIAGNOSIS — Z87891 Personal history of nicotine dependence: Secondary | ICD-10-CM | POA: Insufficient documentation

## 2023-06-06 DIAGNOSIS — R599 Enlarged lymph nodes, unspecified: Secondary | ICD-10-CM | POA: Diagnosis present

## 2023-06-06 DIAGNOSIS — I4891 Unspecified atrial fibrillation: Secondary | ICD-10-CM

## 2023-06-06 DIAGNOSIS — C801 Malignant (primary) neoplasm, unspecified: Secondary | ICD-10-CM

## 2023-06-06 DIAGNOSIS — Z923 Personal history of irradiation: Secondary | ICD-10-CM | POA: Insufficient documentation

## 2023-06-06 DIAGNOSIS — Z86718 Personal history of other venous thrombosis and embolism: Secondary | ICD-10-CM | POA: Insufficient documentation

## 2023-06-06 DIAGNOSIS — K219 Gastro-esophageal reflux disease without esophagitis: Secondary | ICD-10-CM | POA: Insufficient documentation

## 2023-06-06 DIAGNOSIS — Z538 Procedure and treatment not carried out for other reasons: Secondary | ICD-10-CM | POA: Diagnosis not present

## 2023-06-06 DIAGNOSIS — Z8501 Personal history of malignant neoplasm of esophagus: Secondary | ICD-10-CM | POA: Insufficient documentation

## 2023-06-06 DIAGNOSIS — J439 Emphysema, unspecified: Secondary | ICD-10-CM | POA: Diagnosis not present

## 2023-06-06 DIAGNOSIS — C3490 Malignant neoplasm of unspecified part of unspecified bronchus or lung: Secondary | ICD-10-CM

## 2023-06-06 DIAGNOSIS — F32A Depression, unspecified: Secondary | ICD-10-CM | POA: Diagnosis not present

## 2023-06-06 DIAGNOSIS — Z9221 Personal history of antineoplastic chemotherapy: Secondary | ICD-10-CM | POA: Insufficient documentation

## 2023-06-06 DIAGNOSIS — I10 Essential (primary) hypertension: Secondary | ICD-10-CM

## 2023-06-06 DIAGNOSIS — N390 Urinary tract infection, site not specified: Secondary | ICD-10-CM | POA: Diagnosis not present

## 2023-06-06 DIAGNOSIS — C7951 Secondary malignant neoplasm of bone: Secondary | ICD-10-CM | POA: Insufficient documentation

## 2023-06-06 HISTORY — PX: VIDEO BRONCHOSCOPY WITH ENDOBRONCHIAL ULTRASOUND: SHX6177

## 2023-06-06 HISTORY — DX: Cardiac arrhythmia, unspecified: I49.9

## 2023-06-06 HISTORY — DX: Malignant neoplasm of unspecified part of unspecified bronchus or lung: C34.90

## 2023-06-06 HISTORY — DX: Pneumonia, unspecified organism: J18.9

## 2023-06-06 HISTORY — DX: Hyperglycemia, unspecified: R73.9

## 2023-06-06 LAB — BASIC METABOLIC PANEL
Anion gap: 9 (ref 5–15)
BUN: 11 mg/dL (ref 8–23)
CO2: 21 mmol/L — ABNORMAL LOW (ref 22–32)
Calcium: 8.4 mg/dL — ABNORMAL LOW (ref 8.9–10.3)
Chloride: 109 mmol/L (ref 98–111)
Creatinine, Ser: 1.13 mg/dL — ABNORMAL HIGH (ref 0.44–1.00)
GFR, Estimated: 48 mL/min — ABNORMAL LOW (ref >60)
Glucose, Bld: 105 mg/dL — ABNORMAL HIGH (ref 70–99)
Potassium: 3.8 mmol/L (ref 3.5–5.1)
Sodium: 139 mmol/L (ref 135–145)

## 2023-06-06 LAB — CBC
HCT: 37.8 % (ref 36.0–46.0)
Hemoglobin: 11.9 g/dL — ABNORMAL LOW (ref 12.0–15.0)
MCH: 31.3 pg (ref 26.0–34.0)
MCHC: 31.5 g/dL (ref 30.0–36.0)
MCV: 99.5 fL (ref 80.0–100.0)
Platelets: 317 10*3/uL (ref 150–400)
RBC: 3.8 MIL/uL — ABNORMAL LOW (ref 3.87–5.11)
RDW: 16 % — ABNORMAL HIGH (ref 11.5–15.5)
WBC: 10.4 10*3/uL (ref 4.0–10.5)
nRBC: 0 % (ref 0.0–0.2)

## 2023-06-06 SURGERY — BRONCHOSCOPY, WITH EBUS
Anesthesia: General | Laterality: Bilateral

## 2023-06-06 MED ORDER — PHENYLEPHRINE HCL-NACL 20-0.9 MG/250ML-% IV SOLN
INTRAVENOUS | Status: AC
  Filled 2023-06-06: qty 750

## 2023-06-06 MED ORDER — DEXAMETHASONE SODIUM PHOSPHATE 10 MG/ML IJ SOLN
INTRAMUSCULAR | Status: DC | PRN
  Administered 2023-06-06: 10 mg via INTRAVENOUS

## 2023-06-06 MED ORDER — LIDOCAINE 2% (20 MG/ML) 5 ML SYRINGE
INTRAMUSCULAR | Status: DC | PRN
  Administered 2023-06-06: 60 mg via INTRAVENOUS

## 2023-06-06 MED ORDER — CHLORHEXIDINE GLUCONATE 0.12 % MT SOLN
15.0000 mL | Freq: Once | OROMUCOSAL | Status: AC
  Administered 2023-06-06: 15 mL via OROMUCOSAL
  Filled 2023-06-06: qty 15

## 2023-06-06 MED ORDER — PROPOFOL 500 MG/50ML IV EMUL
INTRAVENOUS | Status: AC
  Filled 2023-06-06: qty 150

## 2023-06-06 MED ORDER — PROPOFOL 10 MG/ML IV BOLUS
INTRAVENOUS | Status: DC | PRN
  Administered 2023-06-06: 100 mg via INTRAVENOUS

## 2023-06-06 MED ORDER — ROCURONIUM BROMIDE 10 MG/ML (PF) SYRINGE
PREFILLED_SYRINGE | INTRAVENOUS | Status: DC | PRN
  Administered 2023-06-06: 40 mg via INTRAVENOUS

## 2023-06-06 MED ORDER — LACTATED RINGERS IV SOLN: INTRAVENOUS | Status: DC

## 2023-06-06 MED ORDER — PROPOFOL 500 MG/50ML IV EMUL
INTRAVENOUS | Status: DC | PRN
  Administered 2023-06-06: 125 ug/kg/min via INTRAVENOUS

## 2023-06-06 MED ORDER — ACETAMINOPHEN 500 MG PO TABS
1000.0000 mg | ORAL_TABLET | Freq: Once | ORAL | Status: AC
  Administered 2023-06-06: 1000 mg via ORAL
  Filled 2023-06-06: qty 2

## 2023-06-06 MED ORDER — FENTANYL CITRATE (PF) 250 MCG/5ML IJ SOLN
INTRAMUSCULAR | Status: DC | PRN
  Administered 2023-06-06 (×2): 25 ug via INTRAVENOUS

## 2023-06-06 MED ORDER — ONDANSETRON HCL 4 MG/2ML IJ SOLN
INTRAMUSCULAR | Status: DC | PRN
  Administered 2023-06-06: 4 mg via INTRAVENOUS

## 2023-06-06 MED ORDER — SUGAMMADEX SODIUM 200 MG/2ML IV SOLN
INTRAVENOUS | Status: DC | PRN
  Administered 2023-06-06: 200 mg via INTRAVENOUS

## 2023-06-06 MED ORDER — ESMOLOL HCL 100 MG/10ML IV SOLN
INTRAVENOUS | Status: DC | PRN
  Administered 2023-06-06: 20 mg via INTRAVENOUS

## 2023-06-06 MED ORDER — SUCCINYLCHOLINE CHLORIDE 200 MG/10ML IV SOSY
PREFILLED_SYRINGE | INTRAVENOUS | Status: AC
  Filled 2023-06-06: qty 40

## 2023-06-06 SURGICAL SUPPLY — 29 items
BRUSH CYTOL CELLEBRITY 1.5X140 (MISCELLANEOUS) IMPLANT
CANISTER SUCT 3000ML PPV (MISCELLANEOUS) ×1 IMPLANT
CONT SPEC 4OZ CLIKSEAL STRL BL (MISCELLANEOUS) ×1 IMPLANT
COVER BACK TABLE 60X90IN (DRAPES) ×1 IMPLANT
COVER DOME SNAP 22 D (MISCELLANEOUS) ×1 IMPLANT
FORCEPS BIOP RJ4 1.8 (CUTTING FORCEPS) IMPLANT
GAUZE SPONGE 4X4 12PLY STRL (GAUZE/BANDAGES/DRESSINGS) ×1 IMPLANT
GLOVE BIO SURGEON STRL SZ7.5 (GLOVE) ×1 IMPLANT
GOWN STRL REUS W/ TWL LRG LVL3 (GOWN DISPOSABLE) ×1 IMPLANT
GOWN STRL REUS W/TWL LRG LVL3 (GOWN DISPOSABLE) ×1
KIT CLEAN ENDO COMPLIANCE (KITS) ×2 IMPLANT
KIT TURNOVER KIT B (KITS) ×1 IMPLANT
MARKER SKIN DUAL TIP RULER LAB (MISCELLANEOUS) ×1 IMPLANT
NDL EBUS SONO TIP PENTAX (NEEDLE) ×1 IMPLANT
NEEDLE EBUS SONO TIP PENTAX (NEEDLE) ×1 IMPLANT
NS IRRIG 1000ML POUR BTL (IV SOLUTION) ×1 IMPLANT
OIL SILICONE PENTAX (PARTS (SERVICE/REPAIRS)) ×1 IMPLANT
PAD ARMBOARD 7.5X6 YLW CONV (MISCELLANEOUS) ×2 IMPLANT
SOL ANTI FOG 6CC (MISCELLANEOUS) ×1 IMPLANT
SYR 20CC LL (SYRINGE) ×2 IMPLANT
SYR 20ML ECCENTRIC (SYRINGE) ×2 IMPLANT
SYR 50ML SLIP (SYRINGE) IMPLANT
SYR 5ML LUER SLIP (SYRINGE) ×1 IMPLANT
TOWEL OR 17X24 6PK STRL BLUE (TOWEL DISPOSABLE) ×1 IMPLANT
TRAP SPECIMEN MUCOUS 40CC (MISCELLANEOUS) IMPLANT
TUBE CONNECTING 20X1/4 (TUBING) ×2 IMPLANT
UNDERPAD 30X30 (UNDERPADS AND DIAPERS) ×1 IMPLANT
VALVE DISPOSABLE (MISCELLANEOUS) ×1 IMPLANT
WATER STERILE IRR 1000ML POUR (IV SOLUTION) ×1 IMPLANT

## 2023-06-06 NOTE — Anesthesia Postprocedure Evaluation (Signed)
Anesthesia Post Note  Patient: Cindy Byrd  Procedure(s) Performed: VIDEO BRONCHOSCOPY WITH ENDOBRONCHIAL ULTRASOUND (Bilateral)     Patient location during evaluation: PACU Anesthesia Type: General Level of consciousness: awake Pain management: pain level controlled Vital Signs Assessment: post-procedure vital signs reviewed and stable Respiratory status: spontaneous breathing, nonlabored ventilation and respiratory function stable Cardiovascular status: blood pressure returned to baseline and stable Postop Assessment: no apparent nausea or vomiting Anesthetic complications: no   No notable events documented.  Last Vitals:  Vitals:   06/06/23 1415 06/06/23 1430  BP: 131/78 137/69  Pulse: 89 86  Resp: 19 17  Temp:  36.6 C  SpO2: 93% 97%    Last Pain:  Vitals:   06/06/23 1430  PainSc: 0-No pain                 Linton Rump

## 2023-06-06 NOTE — Anesthesia Procedure Notes (Signed)
Procedure Name: Intubation Date/Time: 06/06/2023 1:15 PM  Performed by: Kayleen Memos, CRNAPre-anesthesia Checklist: Patient identified, Emergency Drugs available, Suction available, Patient being monitored and Timeout performed Patient Re-evaluated:Patient Re-evaluated prior to induction Oxygen Delivery Method: Circle system utilized Preoxygenation: Pre-oxygenation with 100% oxygen Induction Type: IV induction Ventilation: Oral airway inserted - appropriate to patient size Laryngoscope Size: Mac and 4 Grade View: Grade I Tube type: Oral Tube size: 8.0 mm Number of attempts: 1 Airway Equipment and Method: Stylet Placement Confirmation: ETT inserted through vocal cords under direct vision, positive ETCO2, CO2 detector and breath sounds checked- equal and bilateral Secured at: 21 cm Tube secured with: Tape Dental Injury: Injury to lip

## 2023-06-06 NOTE — Discharge Instructions (Signed)

## 2023-06-06 NOTE — Op Note (Signed)
Video Bronchoscopy with Endobronchial Ultrasound Procedure Note  Date of Operation: 06/06/2023  Pre-op Diagnosis: Adenopathy   Post-op Diagnosis: Adenopathy   Surgeon: Josephine Igo DO   Assistants: None   Anesthesia: General endotracheal anesthesia  Operation: Flexible video fiberoptic bronchoscopy with endobronchial ultrasound and biopsies.  Estimated Blood Loss: Minimal  Complications: None   Indications and History: Cindy Byrd is a 82 y.o. female with possible endobronchial lesion.  The risks, benefits, complications, treatment options and expected outcomes were discussed with the patient.  The possibilities of pneumothorax, pneumonia, reaction to medication, pulmonary aspiration, perforation of a viscus, bleeding, failure to diagnose a condition and creating a complication requiring transfusion or operation were discussed with the patient who freely signed the consent.    Description of Procedure: The patient was examined in the preoperative area and history and data from the preprocedure consultation were reviewed. It was deemed appropriate to proceed.  The patient was taken to [facility], identified as LAURAINE DESRUISSEAUX and the procedure verified as Flexible Video Fiberoptic Bronchoscopy.  A Time Out was held and the above information confirmed. After being taken to the operating room general anesthesia was initiated and the patient  was orally intubated. The video fiberoptic bronchoscope was introduced via the endotracheal tube and a general inspection was performed which showed large mucous plug within the left lower lobe this was aspirated clear using a therapeutic bronchoscope.  Once the airway was clear there was no evidence of any endobronchial lesion bilaterally.  The standard scope was then withdrawn and the endobronchial ultrasound was used to identify and characterize the peritracheal, hilar and bronchial lymph nodes. Inspection showed no adenopathy identified.  However  with the Olympus endobronchial ultrasound scope we were able to push that up into the left upper lobe takeoff looking posteriorly we were able to see a thickened area of the pulmonary artery.  This felt to be associated with the contour of the artery itself.  And not exterior to the artery.  Therefore I did not sample this area.  I feel like we need some more information before consideration of sampling that location. The patient tolerated the procedure well without apparent complications. There was no significant blood loss. The bronchoscope was withdrawn. Anesthesia was reversed and the patient was taken to the PACU for recovery.   Samples: None   Plans:  The patient will be discharged from the PACU to home when recovered from anesthesia.. Outpatient followup will be with Josephine Igo, DO    Josephine Igo, DO [facility] 06/06/2023 1:33 PM

## 2023-06-06 NOTE — Telephone Encounter (Signed)
Called patient to inform of Pet Scan on 06/15/23- arrival time- 12 pm @ [facility] patient to have water only- 6 hrs. prior to scan, lvm for a return call

## 2023-06-06 NOTE — Progress Notes (Signed)
SATURATION QUALIFICATIONS: (This note is used to comply with regulatory documentation for home oxygen)  Patient Saturations on Room Air at Rest = 85%  Patient Saturations on Room Air while Ambulating = 68%  Patient Saturations on 3 Liters of oxygen while Ambulating = 93%  Please briefly explain why patient needs home oxygen:

## 2023-06-06 NOTE — Transfer of Care (Signed)
Immediate Anesthesia Transfer of Care Note  Patient: DESSA SZUCS  Procedure(s) Performed: VIDEO BRONCHOSCOPY WITH ENDOBRONCHIAL ULTRASOUND (Bilateral)  Patient Location: PACU  Anesthesia Type:General  Level of Consciousness: awake and drowsy  Airway & Oxygen Therapy: Patient Spontanous Breathing and Patient connected to face mask oxygen  Post-op Assessment: Report given to RN, Post -op Vital signs reviewed and stable, and Patient moving all extremities  Post vital signs: stable  Last Vitals:  Vitals Value Taken Time  BP 138/70 06/06/23 1333  Temp    Pulse 100 06/06/23 1335  Resp 12 06/06/23 1335  SpO2 98 % 06/06/23 1335  Vitals shown include unvalidated device data.  Last Pain:  Vitals:   06/06/23 1207  PainSc: 10-Worst pain ever         Complications: No notable events documented.

## 2023-06-06 NOTE — Interval H&P Note (Signed)
History and Physical Interval Note:  06/06/2023 12:40 PM  Cindy Byrd  has presented today for surgery, with the diagnosis of lung mass, adenopathy.  The various methods of treatment have been discussed with the patient and family. After consideration of risks, benefits and other options for treatment, the patient has consented to  Procedure(s): VIDEO BRONCHOSCOPY WITHOUT FLUORO (Bilateral) VIDEO BRONCHOSCOPY WITH ENDOBRONCHIAL ULTRASOUND (Bilateral) as a surgical intervention.  The patient's history has been reviewed, patient examined, no change in status, stable for surgery.  I have reviewed the patient's chart and labs.  Questions were answered to the patient's satisfaction.     Rachel Bo Roxan Yamamoto

## 2023-06-06 NOTE — Progress Notes (Signed)
Please see prior note 05/09/23 from Dr. Mitzi Hansen, and Op note on 06/06/23 from Dr. Tonia Brooms. Pt has a histroy of stage IV NSCLC originally diagnosed in 2016 who received palliative immunotherapy which has kept her in remission, but who also had adenocarcinoma of the esophagus diagnosed in 2019, treated with chemoradiation.   She recently developed a soft tissue density along the left hilum favored to be adenopathy by CT. She met with Dr. Tonia Brooms and underwent bronchscopy today that did not specifically identify nodes to sample, rather thickening of the pulmonary vasculature, the procedure was aborted. Dr. Tonia Brooms recommends PET imaging for further clarity on the findings by CT. We intend to present her in multidisciplinary thoracic oncology conference once this has been performed to determine next steps in her care with her oncologist, pulmonologist, and radiation oncologist.      Cindy Byrd, PAC

## 2023-06-08 ENCOUNTER — Encounter (HOSPITAL_COMMUNITY): Payer: Self-pay | Admitting: Pulmonary Disease

## 2023-06-13 ENCOUNTER — Other Ambulatory Visit: Payer: Self-pay | Admitting: Family Medicine

## 2023-06-13 ENCOUNTER — Ambulatory Visit (INDEPENDENT_AMBULATORY_CARE_PROVIDER_SITE_OTHER): Payer: Medicare Other | Admitting: Family Medicine

## 2023-06-13 ENCOUNTER — Encounter: Payer: Self-pay | Admitting: Family Medicine

## 2023-06-13 ENCOUNTER — Ambulatory Visit: Payer: Medicare Other | Admitting: Radiation Oncology

## 2023-06-13 VITALS — BP 119/71 | HR 89 | Resp 18 | Ht 68.0 in | Wt 139.0 lb

## 2023-06-13 DIAGNOSIS — B0230 Zoster ocular disease, unspecified: Secondary | ICD-10-CM | POA: Diagnosis not present

## 2023-06-13 DIAGNOSIS — R051 Acute cough: Secondary | ICD-10-CM

## 2023-06-13 DIAGNOSIS — F411 Generalized anxiety disorder: Secondary | ICD-10-CM | POA: Diagnosis not present

## 2023-06-13 DIAGNOSIS — R300 Dysuria: Secondary | ICD-10-CM

## 2023-06-13 DIAGNOSIS — N3000 Acute cystitis without hematuria: Secondary | ICD-10-CM

## 2023-06-13 DIAGNOSIS — R82998 Other abnormal findings in urine: Secondary | ICD-10-CM

## 2023-06-13 DIAGNOSIS — J432 Centrilobular emphysema: Secondary | ICD-10-CM

## 2023-06-13 LAB — POCT URINALYSIS DIP (CLINITEK)
Bilirubin, UA: NEGATIVE
Blood, UA: NEGATIVE
Glucose, UA: NEGATIVE mg/dL
Ketones, POC UA: NEGATIVE mg/dL
Nitrite, UA: NEGATIVE
POC PROTEIN,UA: 30 — AB
Spec Grav, UA: >=1.03 — AB (ref 1.010–1.025)
Urobilinogen, UA: 0.2 E.U./dL
pH, UA: 6 (ref 5.0–8.0)

## 2023-06-13 MED ORDER — DULOXETINE HCL 30 MG PO CPEP
90.0000 mg | ORAL_CAPSULE | Freq: Every day | ORAL | 3 refills | Status: DC
Start: 2023-06-13 — End: 2023-10-24

## 2023-06-13 MED ORDER — GUAIFENESIN 200 MG PO TABS
200.0000 mg | ORAL_TABLET | ORAL | 0 refills | Status: DC | PRN
Start: 2023-06-13 — End: 2023-10-24

## 2023-06-13 MED ORDER — SULFAMETHOXAZOLE-TRIMETHOPRIM 800-160 MG PO TABS
1.0000 | ORAL_TABLET | Freq: Two times a day (BID) | ORAL | 0 refills | Status: DC
Start: 2023-06-13 — End: 2023-09-11

## 2023-06-13 NOTE — Assessment & Plan Note (Addendum)
Continue gabapentin 600 mg 3 times a day for postherpetic neuralgia.  Increasing Cymbalta to 90 mg daily may also help to improve pain further.  Patient verbalized understanding and is agreeable with this plan.

## 2023-06-13 NOTE — Assessment & Plan Note (Addendum)
PHQ-9 score 12, GAD-7 score 12.  Increase Cymbalta to 90 mg daily, max dose is 120 mg daily.  This may also be helpful with ongoing nerve related pain.

## 2023-06-13 NOTE — Progress Notes (Signed)
Established Patient Office Visit  Subjective   Patient ID: Cindy Byrd, female    DOB: 01/11/1941  Age: 82 y.o. MRN: [id number]  Chief Complaint  Patient presents with   Pain Management    HPI GLORIAJEAN OKUN is a 82 y.o. female presenting today for follow up of pain management.  She is still dealing with postherpetic neuralgia, right now taking gabapentin 600 mg 3 times daily.  She also received a fentanyl patch from her oncologist.  She also continues to have ongoing dysuria particularly at the end of her stream.  She has an upcoming PET scan to identify some masses that they were unsure of at oncology.  Additionally, she has had a productive cough for a week or 2 that she cannot seem to shake.  She is in the process of having to move out of her home as well.  With everything going on, her mental health has been really struggling even on 60 mg of Cymbalta each day.  ROS Negative unless otherwise noted in HPI   Objective:     BP 119/71 (BP Location: Left Arm, Patient Position: Sitting, Cuff Size: Normal)   Pulse 89   Resp 18   Ht 5\' 8"  (1.727 m)   Wt 139 lb (63 kg)   SpO2 94%   BMI 21.13 kg/m   Physical Exam Constitutional:      General: She is not in acute distress.    Appearance: Normal appearance.  HENT:     Head: Normocephalic and atraumatic.  Eyes:     Comments: Bandages and stitches have been fully removed.  Mild discoloration above right eye from surgeries, but otherwise healing well  Cardiovascular:     Rate and Rhythm: Normal rate and regular rhythm.     Heart sounds: No murmur heard.    No friction rub. No gallop.  Pulmonary:     Effort: Pulmonary effort is normal. No respiratory distress.     Breath sounds: No wheezing, rhonchi or rales.  Skin:    General: Skin is warm and dry.  Neurological:     Mental Status: She is alert and oriented to person, place, and time.    Results for orders placed or performed in visit on 06/13/23  POCT URINALYSIS DIP  (CLINITEK)  Result Value Ref Range   Color, UA yellow yellow   Clarity, UA clear clear   Glucose, UA negative negative mg/dL   Bilirubin, UA negative negative   Ketones, POC UA negative negative mg/dL   Spec Grav, UA >=4.098 (A) 1.010 - 1.025   Blood, UA negative negative   pH, UA 6.0 5.0 - 8.0   POC PROTEIN,UA =30 (A) negative, trace   Urobilinogen, UA 0.2 0.2 or 1.0 E.U./dL   Nitrite, UA Negative Negative   Leukocytes, UA Trace (A) Negative     Assessment & Plan:  Herpes zoster ophthalmicus of right eye Assessment & Plan: Continue gabapentin 600 mg 3 times a day for postherpetic neuralgia.  Increasing Cymbalta to 90 mg daily may also help to improve pain further.  Patient verbalized understanding and is agreeable with this plan.  Orders: -     DULoxetine HCl; Take 3 capsules (90 mg total) by mouth daily.  Dispense: 120 capsule; Refill: 3  GAD (generalized anxiety disorder) Assessment & Plan: PHQ-9 score 12, GAD-7 score 12.  Increase Cymbalta to 90 mg daily, max dose is 120 mg daily.  This may also be helpful with ongoing nerve related pain.  Orders: -     DULoxetine HCl; Take 3 capsules (90 mg total) by mouth daily.  Dispense: 120 capsule; Refill: 3  Dysuria -     POCT URINALYSIS DIP (CLINITEK) -     Urine Culture -     Sulfamethoxazole-Trimethoprim; Take 1 tablet by mouth 2 (two) times daily.  Dispense: 20 tablet; Refill: 0  Leukocytes in urine -     Urine Culture -     Sulfamethoxazole-Trimethoprim; Take 1 tablet by mouth 2 (two) times daily.  Dispense: 20 tablet; Refill: 0  Acute cystitis without hematuria -     Sulfamethoxazole-Trimethoprim; Take 1 tablet by mouth 2 (two) times daily.  Dispense: 20 tablet; Refill: 0  Acute cough -     guaiFENesin; Take 1 tablet (200 mg total) by mouth every 4 (four) hours as needed for cough or to loosen phlegm.  Dispense: 30 suppository; Refill: 0  Acute cystitis Urinalysis positive for trace leukocytes.  Given results and  symptom of dysuria, sending for urine culture and initiating 10-day course of Bactrim in the meantime.  Will adjust regimen as indicated by urine culture results.  If necessary, we may also consider a referral to gynecology or urology, but she has enough going on right now that she would like to wait on that.  Cough Recommended to continue with conservative measures.  Prescribed guaifenesin as expectorant, we discussed the need to maintain adequate hydration due to the mechanism of action of this medication.  Patient verbalized understanding.  Return in about 6 weeks (around 07/25/2023) for follow up on Cymbalta increase.    Melida Quitter, PA

## 2023-06-15 ENCOUNTER — Encounter
Admission: RE | Admit: 2023-06-15 | Discharge: 2023-06-15 | Disposition: A | Discharge status: Home / Self Care | Payer: Medicare Other | Source: Ambulatory Visit | Attending: Radiation Oncology | Admitting: Radiation Oncology

## 2023-06-15 DIAGNOSIS — C3490 Malignant neoplasm of unspecified part of unspecified bronchus or lung: Secondary | ICD-10-CM | POA: Insufficient documentation

## 2023-06-15 DIAGNOSIS — C159 Malignant neoplasm of esophagus, unspecified: Secondary | ICD-10-CM | POA: Diagnosis not present

## 2023-06-15 LAB — GLUCOSE, CAPILLARY: Glucose-Capillary: 98 mg/dL (ref 70–99)

## 2023-06-15 MED ORDER — FLUDEOXYGLUCOSE F - 18 (FDG) INJECTION
7.2000 | Freq: Once | INTRAVENOUS | Status: AC
  Administered 2023-06-15: 7.6 via INTRAVENOUS

## 2023-06-16 ENCOUNTER — Encounter: Payer: Self-pay | Admitting: Acute Care

## 2023-06-16 ENCOUNTER — Telehealth: Payer: Self-pay

## 2023-06-16 ENCOUNTER — Other Ambulatory Visit: Payer: Self-pay

## 2023-06-16 ENCOUNTER — Ambulatory Visit: Payer: Medicare Other

## 2023-06-16 ENCOUNTER — Ambulatory Visit (INDEPENDENT_AMBULATORY_CARE_PROVIDER_SITE_OTHER): Payer: Medicare Other | Admitting: Acute Care

## 2023-06-16 VITALS — Ht 68.0 in | Wt 139.0 lb

## 2023-06-16 DIAGNOSIS — R911 Solitary pulmonary nodule: Secondary | ICD-10-CM | POA: Diagnosis not present

## 2023-06-16 DIAGNOSIS — C3490 Malignant neoplasm of unspecified part of unspecified bronchus or lung: Secondary | ICD-10-CM

## 2023-06-16 DIAGNOSIS — Z9981 Dependence on supplemental oxygen: Secondary | ICD-10-CM

## 2023-06-16 NOTE — Telephone Encounter (Signed)
ATC to sch pt for 6 min walk test with inogen poc. I will send front desk pool message to sch for pt. Nothing further needed.

## 2023-06-16 NOTE — Patient Instructions (Addendum)
We will do a 4 month follow up Ct Chest to re-evaluate this area.  You will get a call to get this scheduled closer to the time This will be due in October 2024.  Continue wearing oxygen as you have been doing. My nurse will schedule you to come into the office to be walked on an inogen to make sure you qualify for the divice. If you qualify, we will place the order with a DME.  Follow up in 3 months after follow up CT Chest Follow Up Instructions: Follow up in 3 months after repeat Ct Chest Appointment to walk on Inogen / order if qualifies

## 2023-06-16 NOTE — Progress Notes (Signed)
Virtual Visit via Telephone Note  I connected with Cindy Byrd on 06/16/23 at 11:00 AM EDT by telephone and verified that I am speaking with the correct person using two identifiers.  Location: Patient:  At home Provider: [address] [place], [place], [address]    I discussed the limitations, risks, security and privacy concerns of performing an evaluation and management service by telephone and the availability of in person appointments. I also discussed with the patient that there may be a patient responsible charge related to this service. The patient expressed understanding and agreed to proceed.  Synopsis 82 year old female, former smoker quit 2016 with a 60 pack year smoking history, past medical history of esophageal cancer as well as a history of lung cancer treated in 2017 followed by the cancer center. Also has a history of endometriosis status post hysterectomy, gastroesophageal reflux, hyperlipidemia and hypertension. Has a history of shortness of breath with exertion and is currently on Breztri for treatment of COPD. Was referred after having CT of the chest that shows something within the left mainstem concerning for an endobronchial lesion as well as something invading the left pulmonary artery and hilum concerning for malignancy. Patient was agreeable to proceed with bronchoscopy for evaluation. Currently anticoagulated. With the setting of history of DVT. She was seen by Dr. Tonia Brooms 05/23/2023 and decision was made for bronchoscopy with biopsies of the left mainstem bronchus. She underwent Flexible video fiberoptic bronchoscopy with endobronchial ultrasound and biopsies on 06/06/2023.    History of Present Illness: Pt. Was scheduled to be seen today in the office as her post bronchoscopy check. She was a no show, and we proceeded with a phone call to ensure she was doing well after her procedure. She had forgotten about her appointment. She underwent a bronchoscopy with  intention of biopsies, however Dr. Tonia Brooms , with the  endobronchial ultrasound scope ,was able to push up into the left upper lobe takeoff looking posteriorly and  was  able to see a thickened area of the pulmonary artery.  This felt to be associated with the contour of the artery itself.  And not exterior to the artery.  Therefore he did not sample this area. The patient was then returned to the recovery room without biopsies obtained. Plan was for a PET scan to better evaluate the area. She has follow up with her oncologist on 06/19/2023.  She states she has done well since the procedure. She had scant blood in her sputum x 2 days , which self resolved. She also had a slight scratchy throat which self resolved. She has no signs of a respiratory infection, no discolored secretions or fever. No breathing issues. She is on antibiotic for a UTI at present.  She would like an Inogen oxygen device. We will bring her in to the office to be walked on the device. If she maintains her oxygen saturation of pulsed oxygen we will place an order.    Observations/Objective: PET Scan 06/15/2023 No hypermetabolic mediastinal hilar nodes are identified. Recommend follow-up chest CT with contrast in 4-6 months. 2. No new pulmonary lesions or nodules. 3. Stable advanced vascular disease. 4. Stable mixed lytic and sclerotic bone lesion involving the left iliac bone with remote partially united pathologic fracture. No hypermetabolism to suggest active disease.  CTA Chest 05/04/2023 New soft tissue in the left hilum which narrows the left lower lobar pulmonary artery measuring 2.2 x 1.4 cm, and which focally occludes the left upper lobar pulmonary artery  measuring 1.6 x 1.3 cm. Findings are highly concerning for recurrent malignancy. 2. Fluid attenuation endobronchial nodule within the proximal left mainstem bronchus measuring 1.1 x 0.7 cm, possibly secretions although malignant recurrence not excluded. 3. Web-like,  chronic residua of pulmonary embolus in the right upper lobe pulmonary artery and segmental branch vessels. No other evidence of pulmonary embolism.  Assessment and Plan: New soft tissue in the left hilum concerning for recurrent malignancy Biopsy was aborted Negative for hypermetabolism on PET Plan We will do a 4 month follow up Ct Chest to re-evaluate this area.  You will get a call to get this scheduled closer to the time This will be due in October 2024.  Continue wearing oxygen as you have been doing. My nurse will schedule you to come into the office to be walked on an inogen to make sure you qualify for the divice. If you qualify, we will place the order with a DME.  Follow up in 3 months after follow up CT Chest  Follow Up Instructions: Follow up in 3 months after repeat Ct Chest Appointment to walk on Inogen / order if qualifies    I discussed the assessment and treatment plan with the patient. The patient was provided an opportunity to ask questions and all were answered. The patient agreed with the plan and demonstrated an understanding of the instructions.   The patient was advised to call back or seek an in-person evaluation if the symptoms worsen or if the condition fails to improve as anticipated.  I provided 25 minutes of non-face-to-face time during this encounter.   Bevelyn Ngo, NP

## 2023-06-18 LAB — URINE CULTURE

## 2023-06-19 ENCOUNTER — Inpatient Hospital Stay: Payer: Medicare Other | Attending: Hematology

## 2023-06-19 ENCOUNTER — Inpatient Hospital Stay (HOSPITAL_BASED_OUTPATIENT_CLINIC_OR_DEPARTMENT_OTHER): Payer: Medicare Other | Admitting: Hematology

## 2023-06-19 ENCOUNTER — Other Ambulatory Visit: Payer: Self-pay

## 2023-06-19 VITALS — BP 130/68 | HR 88 | Temp 98.1°F | Resp 18 | Wt 141.3 lb

## 2023-06-19 DIAGNOSIS — Z87891 Personal history of nicotine dependence: Secondary | ICD-10-CM | POA: Diagnosis not present

## 2023-06-19 DIAGNOSIS — C7951 Secondary malignant neoplasm of bone: Secondary | ICD-10-CM | POA: Insufficient documentation

## 2023-06-19 DIAGNOSIS — K521 Toxic gastroenteritis and colitis: Secondary | ICD-10-CM | POA: Diagnosis not present

## 2023-06-19 DIAGNOSIS — Z86711 Personal history of pulmonary embolism: Secondary | ICD-10-CM | POA: Insufficient documentation

## 2023-06-19 DIAGNOSIS — Z79899 Other long term (current) drug therapy: Secondary | ICD-10-CM | POA: Insufficient documentation

## 2023-06-19 DIAGNOSIS — Z86718 Personal history of other venous thrombosis and embolism: Secondary | ICD-10-CM | POA: Insufficient documentation

## 2023-06-19 DIAGNOSIS — C3490 Malignant neoplasm of unspecified part of unspecified bronchus or lung: Secondary | ICD-10-CM | POA: Diagnosis not present

## 2023-06-19 DIAGNOSIS — C155 Malignant neoplasm of lower third of esophagus: Secondary | ICD-10-CM | POA: Diagnosis not present

## 2023-06-19 DIAGNOSIS — Z7901 Long term (current) use of anticoagulants: Secondary | ICD-10-CM | POA: Insufficient documentation

## 2023-06-19 LAB — CBC WITH DIFFERENTIAL (CANCER CENTER ONLY)
Abs Immature Granulocytes: 0.05 10*3/uL (ref 0.00–0.07)
Basophils Absolute: 0.1 10*3/uL (ref 0.0–0.1)
Basophils Relative: 1 %
Eosinophils Absolute: 0.2 10*3/uL (ref 0.0–0.5)
Eosinophils Relative: 1 %
HCT: 36.3 % (ref 36.0–46.0)
Hemoglobin: 12 g/dL (ref 12.0–15.0)
Immature Granulocytes: 0 %
Lymphocytes Relative: 17 %
Lymphs Abs: 1.9 10*3/uL (ref 0.7–4.0)
MCH: 32 pg (ref 26.0–34.0)
MCHC: 33.1 g/dL (ref 30.0–36.0)
MCV: 96.8 fL (ref 80.0–100.0)
Monocytes Absolute: 0.8 10*3/uL (ref 0.1–1.0)
Monocytes Relative: 7 %
Neutro Abs: 8.1 10*3/uL — ABNORMAL HIGH (ref 1.7–7.7)
Neutrophils Relative %: 74 %
Platelet Count: 365 10*3/uL (ref 150–400)
RBC: 3.75 MIL/uL — ABNORMAL LOW (ref 3.87–5.11)
RDW: 15.9 % — ABNORMAL HIGH (ref 11.5–15.5)
WBC Count: 11.2 10*3/uL — ABNORMAL HIGH (ref 4.0–10.5)
nRBC: 0 % (ref 0.0–0.2)

## 2023-06-19 LAB — CMP (CANCER CENTER ONLY)
ALT: 6 U/L (ref 0–44)
AST: 16 U/L (ref 15–41)
Albumin: 3.3 g/dL — ABNORMAL LOW (ref 3.5–5.0)
Alkaline Phosphatase: 58 U/L (ref 38–126)
Anion gap: 8 (ref 5–15)
BUN: 14 mg/dL (ref 8–23)
CO2: 21 mmol/L — ABNORMAL LOW (ref 22–32)
Calcium: 9.5 mg/dL (ref 8.9–10.3)
Chloride: 110 mmol/L (ref 98–111)
Creatinine: 1.1 mg/dL — ABNORMAL HIGH (ref 0.44–1.00)
GFR, Estimated: 50 mL/min — ABNORMAL LOW (ref >60)
Glucose, Bld: 132 mg/dL — ABNORMAL HIGH (ref 70–99)
Potassium: 4.6 mmol/L (ref 3.5–5.1)
Sodium: 139 mmol/L (ref 135–145)
Total Bilirubin: 0.3 mg/dL (ref 0.3–1.2)
Total Protein: 7.1 g/dL (ref 6.5–8.1)

## 2023-06-19 NOTE — Progress Notes (Signed)
HEMATOLOGY ONCOLOGY CLINIC VISIT NOTE  Date of service:  06/19/23    Patient Care Team: Melida Quitter, PA as PCP - General (Family Medicine) Johney Maine, MD as Consulting Physician (Hematology and Oncology)  CHIEF COMPLAINTS/PURPOSE OF CONSULTATION:  Follow-up for continued evaluation and management of metastatic lung cancer Follow-up for esophageal cancer  DIAGNOSIS:   #1 Metastatic non-small cell lung cancer with bilateral lung nodules and large metastatic lesion in the left Ilium. 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 Also had c diff colitis - treated   Current Treatment  1) Active surveillance 2) Xgeva 120mg  [place] q12weeks for bone metastases. 3) Sandostatin q4weeks for diarrhea   Previous Treatment  For metastatic lung cancer 1 Palliative radiation therapy to the large left ilium metastases 2. IV Nivolumab x 20 cycles (discontinued due to likely immune colitis) 3. Xgeva 120mg   q4weeks for bone metastases.  For Esophageal adenocarcinoma S/p Concurrent carbo/taxol + RT  HISTORY OF PRESENTING ILLNESS: (plz see my previous consultation for details of initial presentation)  INTERVAL HISTORY:  Patient is here for continued evaluation and management of pleural metastatic lung cancer and esophageal cancer and her diffuse.  Patient was last seen by me on 04/14/2023 and she complained of shortness of breath when walking and after activities.   Patient is accompanied by her son during this visit. She notes she has been doing well overall since our last visit. She had another eye surgery since our last visit and is recovering from it.   Patient notes she had shingles near top of her right eye in the past.   She denies any new infection issues, fever, chills, night sweats, unexpected weight loss, abdominal pain, chest pain, back pain, or leg swelling. Patient notes that her diarrhea is well  controlled.   Patient notes she regularly follows-up with her Pulmonologist. She has been given an oxygen tank at home. Patient's son notes that the Pulmonologist told the patient to use the oxygen all the time, but the patient is only using oxygen as needed.   Patient notes she has had 3 recurrent UTI since our last visit. She has been following up with her PCP regarding recurrent UTI. Patient has not been referred to Urologist.   She notes that she will be selling her house and moving in with her daughter later this year.     MEDICAL HISTORY:  Past Medical History:  Diagnosis Date   Barrett's esophagus    Bilateral pulmonary embolism (HCC) 09/02/2016   09/02/16 bilateral pulmonary emboli in context of extensive bilateral lower extremity deep venous thromboses Assumed hypercoagulability due to non-small cell metastatic lung cancer Lifelong anticoagulation recommended   Bone neoplasm 06/24/2015   Cancer (HCC)    metastatic poorly differentiated carcinoma. tumor left groin surgical removal with radiation tx.   Cataract    BILATERAL   Cigarette smoker two packs a day or less    Currently still smoking 2 PPD - Not interested in quitting at this time.   Colitis 2017   Colon polyps    hyperplastic, tubular adenomas, tubulovillous adenoma   Cough, persistent    hx. lung cancer ? primary-being evaluated, unsure of primary site.   Depression 06/24/2015   Diverticulosis    DVT of lower extremity, bilateral (HCC) 09/02/2016   Dysrhythmia    PAF in setting of submassive PE 09/2016   Emphysema of lung (HCC)    Endometriosis  Hysterectomy with BSO at age 39 yrs   Esophageal adenocarcinoma (HCC) 08/11/2015   intramucosal   Gastritis    GERD (gastroesophageal reflux disease)    H/O: pneumonia    Hiatal hernia    Hyperglycemia    A1c 7.4% 06/20/22   Hyperlipidemia    Hypertension 06/24/2015   likely improved incidental to 40 lbs weight loss from her neoplasm. No Longer taking med for  this as of 08-06-15   IBS (irritable bowel syndrome)    Lung cancer (HCC)    suspected IV lung cancer with left ileum mets 2017, s/p left ileum radiaiton, immunotherapy   Pain    left hip-persistent"tumor of bone"-radiation tx. 10.   Pneumonia    Vitamin D deficiency disease    SURGICAL HISTORY: Past Surgical History:  Procedure Laterality Date   ABDOMINAL HYSTERECTOMY     BALLOON DILATION N/A 10/08/2019   Procedure: BALLOON DILATION;  Surgeon: Beverley Fiedler, MD;  Location: [facility];  Service: Gastroenterology;  Laterality: N/A;   BARTHOLIN GLAND CYST EXCISION  82 yo ago   Does not want if it was an infected cyst or tumor. Was soon as delivery   BIOPSY  01/02/2019   Procedure: BIOPSY;  Surgeon: Beverley Fiedler, MD;  Location: [facility];  Service: Gastroenterology;;   CATARACT EXTRACTION     COLONOSCOPY W/ POLYPECTOMY     multiple times - last done 09/2014 per patient.   ESOPHAGOGASTRODUODENOSCOPY (EGD) WITH PROPOFOL N/A 08/11/2015   Procedure: ESOPHAGOGASTRODUODENOSCOPY (EGD) WITH PROPOFOL;  Surgeon: Beverley Fiedler, MD;  Location: [facility];  Service: Gastroenterology;  Laterality: N/A;   ESOPHAGOGASTRODUODENOSCOPY (EGD) WITH PROPOFOL N/A 01/02/2019   Procedure: ESOPHAGOGASTRODUODENOSCOPY (EGD) WITH PROPOFOL;  Surgeon: Beverley Fiedler, MD;  Location: [facility];  Service: Gastroenterology;  Laterality: N/A;   ESOPHAGOGASTRODUODENOSCOPY (EGD) WITH PROPOFOL N/A 10/08/2019   Procedure: ESOPHAGOGASTRODUODENOSCOPY (EGD) WITH PROPOFOL;  Surgeon: Beverley Fiedler, MD;  Location: [facility];  Service: Gastroenterology;  Laterality: N/A;   EYE SURGERY Right 2024   Multiple surgeries over the past year.   FLEXIBLE SIGMOIDOSCOPY N/A 06/24/2017   Procedure: FLEXIBLE SIGMOIDOSCOPY;  Surgeon: Ruffin Frederick, MD;  Location: [facility];  Service: Gastroenterology;  Laterality: N/A;   GANGLION CYST EXCISION     KNEE ARTHROSCOPY  age about 42 yrs   TONSILLECTOMY     TOTAL ABDOMINAL  HYSTERECTOMY W/ BILATERAL SALPINGOOPHORECTOMY  at age 1 yrs   For endometriosis   VIDEO BRONCHOSCOPY WITH ENDOBRONCHIAL ULTRASOUND Bilateral 06/06/2023   Procedure: VIDEO BRONCHOSCOPY WITH ENDOBRONCHIAL ULTRASOUND;  Surgeon: Josephine Igo, DO;  Location: [facility];  Service: Cardiopulmonary;  Laterality: Bilateral;    SOCIAL HISTORY: Social History   Socioeconomic History   Marital status: Widowed    Spouse name: Not on file   Number of children: 2   Years of education: Not on file   Highest education level: Not on file  Occupational History   Not on file  Tobacco Use   Smoking status: Former    Current packs/day: 0.00    Average packs/day: 1 pack/day for 60.0 years (60.0 ttl pk-yrs)    Types: Cigarettes    Start date: 12/05/1954    Quit date: 12/05/2014    Years since quitting: 8.5    Passive exposure: Never   Smokeless tobacco: Never  Vaping Use   Vaping status: Never Used  Substance and Sexual Activity   Alcohol use: No    Alcohol/week: 0.0 standard drinks of alcohol   Drug use: No  Sexual activity: Not Currently  Other Topics Concern   Not on file  Social History Narrative   Not on file   Social Determinants of Health   Financial Resource Strain: Low Risk  (03/28/2023)   Overall Financial Resource Strain (CARDIA)    Difficulty of Paying Living Expenses: Not hard at all  Food Insecurity: No Food Insecurity (03/28/2023)   Hunger Vital Sign    Worried About Running Out of Food in the Last Year: Never true    Ran Out of Food in the Last Year: Never true  Transportation Needs: No Transportation Needs (03/28/2023)   PRAPARE - Administrator, Civil Service (Medical): No    Lack of Transportation (Non-Medical): No  Physical Activity: Inactive (03/28/2023)   Exercise Vital Sign    Days of Exercise per Week: 0 days    Minutes of Exercise per Session: 0 min  Stress: No Stress Concern Present (03/28/2023)   [employer] of Occupational Health -  Occupational Stress Questionnaire    Feeling of Stress : Not at all  Social Connections: Unknown (03/28/2023)   Social Connection and Isolation Panel [NHANES]    Frequency of Communication with Friends and Family: Patient declined    Frequency of Social Gatherings with Friends and Family: Patient declined    Attends Religious Services: Patient declined    Database administrator or Organizations: Patient declined    Attends Banker Meetings: Patient declined    Marital Status: Widowed  Intimate Partner Violence: Not At Risk (03/28/2023)   Humiliation, Afraid, Rape, and Kick questionnaire    Fear of Current or Ex-Partner: No    Emotionally Abused: No    Physically Abused: No    Sexually Abused: No    FAMILY HISTORY: Family History  Problem Relation Age of Onset   Colon cancer Brother    Colon cancer Brother    Stroke Mother    Colon cancer Father    Emphysema Father        smoked   Breast cancer Daughter 12       ER/PR+ stage II    ALLERGIES:  is allergic to penicillins, remeron [mirtazapine], and latex. patient wonders if she has a penicillin allergy but notes that she is uncertain about this.  MEDICATIONS:  Current Outpatient Medications  Medication Sig Dispense Refill   acetaminophen (TYLENOL) 325 MG tablet Take 2 tablets (650 mg total) by mouth every 6 (six) hours as needed for mild pain (or Fever >/= 101). 20 tablet 0   acidophilus (RISAQUAD) CAPS capsule Take 1 capsule by mouth daily.     albuterol (VENTOLIN HFA) 108 (90 Base) MCG/ACT inhaler Inhale 2 puffs into the lungs every 6 (six) hours as needed for wheezing. (Patient not taking: Reported on 06/13/2023) 2 each 11   apixaban (ELIQUIS) 2.5 MG TABS tablet Take 1 tablet (2.5 mg total) by mouth 2 (two) times daily. 60 tablet 2   BREZTRI AEROSPHERE 160-9-4.8 MCG/ACT AERO INHALE 2 PUFFS INTO THE LUNGS TWICE A DAY 10.7 each 1   buPROPion (WELLBUTRIN XL) 150 MG 24 hr tablet TAKE 1 TABLET BY MOUTH EVERY DAY 30 tablet  0   Cyanocobalamin (B-12) 2500 MCG TABS Take 2,500 mcg by mouth daily. (Patient not taking: Reported on 06/13/2023)     DULoxetine (CYMBALTA) 30 MG capsule Take 3 capsules (90 mg total) by mouth daily. 120 capsule 3   feeding supplement (ENSURE ENLIVE / ENSURE PLUS) LIQD Take 237 mLs by mouth 2 (two)  times daily between meals. (Patient not taking: Reported on 06/02/2023) 237 mL 12   fentaNYL (DURAGESIC) 12 MCG/HR Place 1 patch onto the skin every 3 (three) days. 10 patch 0   gabapentin (NEURONTIN) 300 MG capsule Take 300 mg by mouth daily.     guaiFENesin 200 MG tablet Take 1 tablet (200 mg total) by mouth every 4 (four) hours as needed for cough or to loosen phlegm. 30 suppository 0   HYDROcodone-acetaminophen (NORCO) 5-325 MG tablet Take 1 tablet by mouth every 6 (six) hours as needed for moderate pain. 30 tablet 0   neomycin-polymyxin b-dexamethasone (MAXITROL) 3.5-10000-0.1 SUSP Place 1 drop into the right eye 4 (four) times daily.     omeprazole (PRILOSEC) 40 MG capsule TAKE 1 CAPSULE BY MOUTH EVERY DAY BEFORE BREAKFAST 90 capsule 1   ondansetron (ZOFRAN-ODT) 4 MG disintegrating tablet Take 1 tablet (4 mg total) by mouth every 8 (eight) hours as needed for nausea or vomiting. 20 tablet 0   sulfamethoxazole-trimethoprim (BACTRIM DS) 800-160 MG tablet Take 1 tablet by mouth 2 (two) times daily. 20 tablet 0   valACYclovir (VALTREX) 500 MG tablet TAKE 1 TABLET BY MOUTH TWICE A DAY 180 tablet 3   No current facility-administered medications for this visit.   REVIEW OF SYSTEMS:  10 Point review of Systems was done is negative except as noted above.   PHYSICAL EXAMINATION:  .BP 130/68   Pulse 88   Temp 98.1 F (36.7 C)   Resp 18   Wt 141 lb 4.8 oz (64.1 kg)   SpO2 93%   BMI 21.48 kg/m   GENERAL:alert, in no acute distress and comfortable SKIN: rt eyelide sx changes EYES: conjunctiva are pink and non-injected, sclera anicteric OROPHARYNX: MMM, no exudates, no oropharyngeal erythema or  ulceration NECK: supple, no JVD LYMPH:  no palpable lymphadenopathy in the cervical, axillary or inguinal regions LUNGS: clear to auscultation b/l with normal respiratory effort HEART: regular rate & rhythm ABDOMEN:  normoactive bowel sounds , non tender, not distended. Extremity: no pedal edema PSYCH: alert & oriented x 3 with fluent speech NEURO: no focal motor/sensory deficits    LABORATORY DATA:  I have reviewed the data as listed  .    Latest Ref Rng & Units 06/19/2023    1:35 PM 06/06/2023   12:05 PM 04/14/2023   11:44 AM  CBC  WBC 4.0 - 10.5 K/uL 11.2  10.4  9.8   Hemoglobin 12.0 - 15.0 g/dL 14.7  82.9  56.2   Hematocrit 36.0 - 46.0 % 36.3  37.8  37.5   Platelets 150 - 400 K/uL 365  317  310    ANC 1.8k .    Latest Ref Rng & Units 06/19/2023    1:35 PM 06/06/2023   12:05 PM 04/14/2023   11:44 AM  CMP  Glucose 70 - 99 mg/dL 130  865  784   BUN 8 - 23 mg/dL 14  11  13    Creatinine 0.44 - 1.00 mg/dL 6.96  2.95  2.84   Sodium 135 - 145 mmol/L 139  139  140   Potassium 3.5 - 5.1 mmol/L 4.6  3.8  3.3   Chloride 98 - 111 mmol/L 110  109  106   CO2 22 - 32 mmol/L 21  21  26    Calcium 8.9 - 10.3 mg/dL 9.5  8.4  8.3   Total Protein 6.5 - 8.1 g/dL 7.1   7.3   Total Bilirubin 0.3 - 1.2 mg/dL 0.3  0.3   Alkaline Phos 38 - 126 U/L 58   86   AST 15 - 41 U/L 16   18   ALT 0 - 44 U/L 6   8         01/02/19 Esophagus Biopsy:    RADIOGRAPHIC STUDIES: .NM PET Image Restag (PS) Skull Base To Thigh  Result Date: 06/16/2023 CLINICAL DATA:  Subsequent treatment strategy for non-small cell lung cancer. Patient also has a history of esophageal cancer. EXAM: NUCLEAR MEDICINE PET SKULL BASE TO THIGH TECHNIQUE: 7.6 mCi F-18 FDG was injected intravenously. Full-ring PET imaging was performed from the skull base to thigh after the radiotracer. CT data was obtained and used for attenuation correction and anatomic localization. Fasting blood glucose: 98 mg/dl COMPARISON:  Chest CT 59/56/3875  and remote PET-CT from 2020 FINDINGS: Mediastinal blood pool activity: SUV max 2.25 Liver activity: SUV max NA NECK: No hypermetabolic lymph nodes in the neck. Incidental CT findings: None. CHEST: No hypermetabolic mediastinal or hilar nodes are identified. The abnormality on the prior CT scan measuring 14 Hounsfield units and may be a pericardial recess. No hypermetabolic endobronchial process is identified. No worrisome pulmonary nodules or pulmonary lesions. No hypermetabolic breast masses, supraclavicular or axillary adenopathy. Incidental CT findings: Stable emphysematous changes and pulmonary scarring. Stable advanced vascular disease. ABDOMEN/PELVIS: No abnormal hypermetabolic activity within the liver, pancreas, adrenal glands, or spleen. No hypermetabolic lymph nodes in the abdomen or pelvis. Incidental CT findings: Stable advanced atherosclerotic calcifications involving the aorta and branch vessels but no aneurysm. Small layering gallstones are noted the gallbladder. SKELETON: Stable large mixed lytic and sclerotic bone lesion involving the left iliac bone with remote partially united pathologic fracture. No hypermetabolism to suggest active disease. Remote healed left posterior rib fractures. No new bone lesions. Incidental CT findings: None. IMPRESSION: 1. No hypermetabolic mediastinal hilar nodes are identified. Recommend follow-up chest CT with contrast in 4-6 months. 2. No new pulmonary lesions or nodules. 3. Stable advanced vascular disease. 4. Stable mixed lytic and sclerotic bone lesion involving the left iliac bone with remote partially united pathologic fracture. No hypermetabolism to suggest active disease. Aortic Atherosclerosis (ICD10-I70.0) and Emphysema (ICD10-J43.9). Electronically Signed   By: Rudie Meyer M.D.   On: 06/16/2023 10:45    ASSESSMENT & PLAN:   82 y.o. female with  #1 Metastatic poorly differentiated carcinoma with likely lung primary non-small cell lung cancer.     CT of the head with and without contrast showed no evidence of metastatic disease. EGFR blood test mutation analysis negative. CT chest abdomen pelvis 04/19/2016 shows no evidence of disease progression. Patient tolerated Nivolumab very well but was discontinued when she developed grade 2 Immune colitis. Has been off Nivolumab for >6 months  CT chest abdomen pelvis on 06/24/2016 shows no evidence of new disease or progression of metastatic disease. CT chest abdomen pelvis 09/06/2016 shows 1. Mixed interval response to therapy. 2. There is a new left ventral chest wall lesion deep to the pectoralis musculature worrisome for metastatic disease. 3. Posterior lower lobe nodular densities are identified which may reflect areas of pulmonary metastasis. 4. Interval decrease in size of destructive lesion involving the left iliac bone.  CT chest abd pelvis 12/08/2016: Cystic mass involving the left ventral chest wall has resolved in the interval. Likely was a hematoma due to trauma. Interval increase in size of pleural base mass overlying the posterior and inferior left lower lobe. There is also a new left pleural effusion identified.  CT chest  02/01/2017: Residual irregular soft tissue thickening/volume loss and trace left pleural fluid at the base of the left hemithorax, overall improved in appearance from 12/08/2016. No measurable lesion.  CT chest 05/29/2017 shows no residual pleural based mass or significant pleural effusion in the left hemithorax. No evidence of thoracic metastatic disease. No evidence of progressive metastatic disease within the abdomen or pelvis. Mixed lytic and blastic lesion involving the left iliac bone and associated pathologic fracture are unchanged.   CT CAP 09/14/17 shows no new changes. She does have slight displacement of her fractured left iliac bone. Evidence of stable disease.   CT CAP 01/04/2018- No new or progressive metastatic disease. Stable large left iliac bone  metastasis with associated chronic pathologic fracture.   CT chest/abd/pelvis done on 04/26/18 revealed Stable exam.  No new or progressive interval findings.  07/19/18 CT C/A/P revealed Stable exam.  No new or progressive interval findings. Large destructive left iliac lesion is similar to prior. Aortic Atherosclerosis and Emphysema.    11/06/18 CT C/A/P revealed Similar appearance of large mixed lytic and sclerotic lesion in the left ilium. No new metastatic lesions are otherwise noted elsewhere in the chest, abdomen or pelvis. 2. Interval development of thickening of the distal third of the esophagus. This is nonspecific, and could be related to underlying reflux esophagitis. However, if there is any clinical concern for Barrett's metaplasia or esophageal neoplasia, further evaluation with nonemergent endoscopy could be considered. 3. Aortic atherosclerosis, in addition to left main coronary artery disease. Assessment for potential risk factor modification, dietary therapy or pharmacologic therapy may be warranted, if clinically Indicated. 4. Diffuse bronchial wall thickening with mild to moderate centrilobular and paraseptal emphysema; imaging findings suggestive of underlying COPD. 5. Additional incidental findings, as above.  #2  Adenocarcinoma of the Esophagus  Barrett's esophagus 4cms in the distal esophagus with low and high-grade dysplasia  01/02/19 Surgical pathology revealed adenocarcinoma of the esophagus   01/25/19 PET/CT revealed Distal esophageal primary, without hypermetabolic metastatic disease. 2. Chronic left iliac metastasis, as before. 3. Hypermetabolism within and superficial to the right gluteal musculature is most likely related to trauma and/or injection sites. 4. Aortic atherosclerosis, coronary artery atherosclerosis and emphysema.  S/p concurrent Carboplatin and Taxol weekly with RT of 45 Gy in 25 fractions and 5.4 Gy boost, completed between 02/04/19 and 03/27/19  07/03/2019  PET skull base to thigh revealed "1. Interval response to therapy. Significant reduction in FDG uptake associated with distal esophageal mass. SUV max currently 2.61 versus 16.97 previously. 2. 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 Unchanged 3. Aortic Atherosclerosis (ICD10-I70.0) and Emphysema (ICD10-J43.9). Coronary artery calcifications."  07/15/2020 CT C/A/P ([id number]) ([id number]) revealed "1. No evidence of new or progressive metastatic disease in the chest, abdomen or pelvis."  #3 diarrhea-  now resolved was previously. 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 She was previously on on Lialda, budesonide,probiotics and lomotil but not currently taking any of these. Plan -Continue Sandostatin every 4 weeks   #4 h/o DVT and PE  -continue on Xarelto - no issues with bleeding   #5 history of COPD management per primary care physician  #6 severe shingles of the forehead with postherpetic neuralgia  PLAN: -Discussed lab results from today, 06/19/2023, with the patient. CBC shows slightly elevated WBC at 11.2, but stable overall. CMP shows slightly elevated creatinine at 1.10, but stable overall.  -  Discussed PET scan results from 06/15/2023 with the patient, which did not show any abnormalities.  -Answered all of patient's and her son's questions regarding the PET scan results. -Discussed with the patient about the importance of using oxygen at home. Patient needs to use the oxygen as prescribed by her Pulmonologist.  -Recommended to follow-up with her PCP regarding recurrent UTI and the need to be referred to Urologist.  -Hold Sandostatin injection and see if diarrhea has resolved.  -Recommend to follow-up with Pulmonologist regarding oxygen tank.   -hold Xgeva due to dental work.   FOLLOW-UP: Discontinue appointments for Xgeva and Sandostatin  which are held for now. RTC with Dr Candise Che with labs in 3 months   The total time spent in the appointment was 30 minutes* .  All of the patient's questions were answered with apparent satisfaction. The patient knows to call the clinic with any problems, questions or concerns.   Wyvonnia Lora MD MS AAHIVMS [facility] Hematology/Oncology Physician [facility]  .*Total Encounter Time as defined by the Centers for Medicare and Medicaid Services includes, in addition to the face-to-face time of a patient visit (documented in the note above) non-face-to-face time: obtaining and reviewing outside history, ordering and reviewing medications, tests or procedures, care coordination (communications with other health care professionals or caregivers) and documentation in the medical record.   I, Ok Edwards, acting as a Neurosurgeon for Wyvonnia Lora, MD.,have documented all relevant documentation on the behalf of Wyvonnia Lora, MD,as directed by  Wyvonnia Lora, MD while in the presence of Wyvonnia Lora, MD.  .I have reviewed the above documentation for accuracy and completeness, and I agree with the above. Johney Maine MD

## 2023-06-20 ENCOUNTER — Telehealth: Payer: Self-pay | Admitting: Hematology

## 2023-06-20 ENCOUNTER — Telehealth: Payer: Self-pay | Admitting: Acute Care

## 2023-06-20 DIAGNOSIS — B0239 Other herpes zoster eye disease: Secondary | ICD-10-CM | POA: Diagnosis not present

## 2023-06-20 DIAGNOSIS — H02211 Cicatricial lagophthalmos right upper eyelid: Secondary | ICD-10-CM | POA: Diagnosis not present

## 2023-06-20 DIAGNOSIS — H18891 Other specified disorders of cornea, right eye: Secondary | ICD-10-CM | POA: Diagnosis not present

## 2023-06-20 DIAGNOSIS — H16231 Neurotrophic keratoconjunctivitis, right eye: Secondary | ICD-10-CM | POA: Diagnosis not present

## 2023-06-20 NOTE — Telephone Encounter (Signed)
Patient would like order for portable oxygen concentrator. Would like the purse concentrator. Would like to use [employer]. Patient phone number is [phone] and [phone].

## 2023-06-20 NOTE — Telephone Encounter (Signed)
Called twice; unable to leave a message regarding patient's upcoming appointments

## 2023-06-21 ENCOUNTER — Telehealth: Payer: Self-pay | Admitting: Radiation Oncology

## 2023-06-21 NOTE — Telephone Encounter (Signed)
I called the patient to review her PET scan as well. We will plan to follow up with her CT at her next interval in October as she also sees Dr. Candise Che.

## 2023-06-22 ENCOUNTER — Other Ambulatory Visit: Payer: Self-pay | Admitting: Family Medicine

## 2023-06-22 ENCOUNTER — Other Ambulatory Visit: Payer: Self-pay | Admitting: Hematology

## 2023-06-22 DIAGNOSIS — B0230 Zoster ocular disease, unspecified: Secondary | ICD-10-CM

## 2023-06-26 DIAGNOSIS — B0239 Other herpes zoster eye disease: Secondary | ICD-10-CM | POA: Diagnosis not present

## 2023-06-26 DIAGNOSIS — H18891 Other specified disorders of cornea, right eye: Secondary | ICD-10-CM | POA: Diagnosis not present

## 2023-06-26 DIAGNOSIS — H02211 Cicatricial lagophthalmos right upper eyelid: Secondary | ICD-10-CM | POA: Diagnosis not present

## 2023-06-26 DIAGNOSIS — H16231 Neurotrophic keratoconjunctivitis, right eye: Secondary | ICD-10-CM | POA: Diagnosis not present

## 2023-06-27 ENCOUNTER — Encounter: Payer: Self-pay | Admitting: Hematology

## 2023-06-28 NOTE — Telephone Encounter (Signed)
Pt needs a sch'd 6 min walk to qualify for POC

## 2023-07-04 DIAGNOSIS — M6281 Muscle weakness (generalized): Secondary | ICD-10-CM | POA: Diagnosis not present

## 2023-07-06 DIAGNOSIS — H18891 Other specified disorders of cornea, right eye: Secondary | ICD-10-CM | POA: Diagnosis not present

## 2023-07-06 DIAGNOSIS — H02211 Cicatricial lagophthalmos right upper eyelid: Secondary | ICD-10-CM | POA: Diagnosis not present

## 2023-07-06 DIAGNOSIS — H16231 Neurotrophic keratoconjunctivitis, right eye: Secondary | ICD-10-CM | POA: Diagnosis not present

## 2023-07-06 DIAGNOSIS — B0239 Other herpes zoster eye disease: Secondary | ICD-10-CM | POA: Diagnosis not present

## 2023-07-07 DIAGNOSIS — C3490 Malignant neoplasm of unspecified part of unspecified bronchus or lung: Secondary | ICD-10-CM | POA: Diagnosis not present

## 2023-07-12 ENCOUNTER — Other Ambulatory Visit: Payer: Self-pay

## 2023-07-13 ENCOUNTER — Encounter: Payer: Self-pay | Admitting: Hematology

## 2023-07-13 MED ORDER — FENTANYL 12 MCG/HR TD PT72: 1.0000 | MEDICATED_PATCH | TRANSDERMAL | 0 refills | Status: DC

## 2023-07-17 ENCOUNTER — Other Ambulatory Visit: Payer: Self-pay | Admitting: Family Medicine

## 2023-07-17 DIAGNOSIS — B0230 Zoster ocular disease, unspecified: Secondary | ICD-10-CM

## 2023-07-17 DIAGNOSIS — H18891 Other specified disorders of cornea, right eye: Secondary | ICD-10-CM | POA: Diagnosis not present

## 2023-07-17 DIAGNOSIS — H16231 Neurotrophic keratoconjunctivitis, right eye: Secondary | ICD-10-CM | POA: Diagnosis not present

## 2023-07-17 DIAGNOSIS — H02211 Cicatricial lagophthalmos right upper eyelid: Secondary | ICD-10-CM | POA: Diagnosis not present

## 2023-07-17 DIAGNOSIS — B0239 Other herpes zoster eye disease: Secondary | ICD-10-CM | POA: Diagnosis not present

## 2023-07-18 ENCOUNTER — Telehealth: Payer: Self-pay | Admitting: Pulmonary Disease

## 2023-07-18 NOTE — Telephone Encounter (Signed)
Oxygen Machine in home and does not know what to do with it. Machine was ordered by office

## 2023-07-19 NOTE — Telephone Encounter (Signed)
I found the acct with the help of [facility]. She is with [employer]. Her acct # is [id number]. Christoper Allegra is going to call her and likely they will send in a Removal replace order so consider this closed. Thanks,.

## 2023-07-19 NOTE — Telephone Encounter (Signed)
PT calling again. She is moving and has to contect O2 supplier to come for large tank. She does not know who that may be.Needs a call [employer]. Her # is [phone]

## 2023-07-20 ENCOUNTER — Other Ambulatory Visit: Payer: Self-pay | Admitting: Hematology

## 2023-07-21 ENCOUNTER — Encounter: Payer: Self-pay | Admitting: Hematology

## 2023-07-26 ENCOUNTER — Other Ambulatory Visit: Payer: Self-pay | Admitting: Hematology

## 2023-07-26 ENCOUNTER — Other Ambulatory Visit: Payer: Self-pay | Admitting: Family Medicine

## 2023-07-26 DIAGNOSIS — B0230 Zoster ocular disease, unspecified: Secondary | ICD-10-CM

## 2023-07-27 ENCOUNTER — Encounter: Payer: Self-pay | Admitting: Hematology

## 2023-07-31 DIAGNOSIS — B0239 Other herpes zoster eye disease: Secondary | ICD-10-CM | POA: Diagnosis not present

## 2023-07-31 DIAGNOSIS — H16231 Neurotrophic keratoconjunctivitis, right eye: Secondary | ICD-10-CM | POA: Diagnosis not present

## 2023-07-31 DIAGNOSIS — H16001 Unspecified corneal ulcer, right eye: Secondary | ICD-10-CM | POA: Diagnosis not present

## 2023-07-31 DIAGNOSIS — H20051 Hypopyon, right eye: Secondary | ICD-10-CM | POA: Diagnosis not present

## 2023-07-31 DIAGNOSIS — H18891 Other specified disorders of cornea, right eye: Secondary | ICD-10-CM | POA: Diagnosis not present

## 2023-07-31 DIAGNOSIS — H02211 Cicatricial lagophthalmos right upper eyelid: Secondary | ICD-10-CM | POA: Diagnosis not present

## 2023-08-02 DIAGNOSIS — H16011 Central corneal ulcer, right eye: Secondary | ICD-10-CM | POA: Diagnosis not present

## 2023-08-02 DIAGNOSIS — H16031 Corneal ulcer with hypopyon, right eye: Secondary | ICD-10-CM | POA: Diagnosis not present

## 2023-08-02 DIAGNOSIS — H1830 Unspecified corneal membrane change: Secondary | ICD-10-CM | POA: Diagnosis not present

## 2023-08-02 DIAGNOSIS — H16211 Exposure keratoconjunctivitis, right eye: Secondary | ICD-10-CM | POA: Diagnosis not present

## 2023-08-04 DIAGNOSIS — H1830 Unspecified corneal membrane change: Secondary | ICD-10-CM | POA: Diagnosis not present

## 2023-08-04 DIAGNOSIS — H16031 Corneal ulcer with hypopyon, right eye: Secondary | ICD-10-CM | POA: Diagnosis not present

## 2023-08-09 DIAGNOSIS — H16031 Corneal ulcer with hypopyon, right eye: Secondary | ICD-10-CM | POA: Diagnosis not present

## 2023-08-09 DIAGNOSIS — H1830 Unspecified corneal membrane change: Secondary | ICD-10-CM | POA: Diagnosis not present

## 2023-08-15 ENCOUNTER — Ambulatory Visit: Payer: Medicare Other | Admitting: Family Medicine

## 2023-08-20 ENCOUNTER — Other Ambulatory Visit: Payer: Self-pay | Admitting: Hematology

## 2023-08-21 ENCOUNTER — Encounter: Payer: Self-pay | Admitting: Hematology

## 2023-08-23 ENCOUNTER — Other Ambulatory Visit: Payer: Self-pay

## 2023-08-29 ENCOUNTER — Encounter: Payer: Self-pay | Admitting: Hematology

## 2023-08-29 MED ORDER — FENTANYL 12 MCG/HR TD PT72: 1.0000 | MEDICATED_PATCH | TRANSDERMAL | 0 refills | Status: DC

## 2023-09-04 DIAGNOSIS — H1830 Unspecified corneal membrane change: Secondary | ICD-10-CM | POA: Diagnosis not present

## 2023-09-04 DIAGNOSIS — B0229 Other postherpetic nervous system involvement: Secondary | ICD-10-CM | POA: Diagnosis not present

## 2023-09-08 ENCOUNTER — Other Ambulatory Visit: Payer: Self-pay

## 2023-09-08 DIAGNOSIS — C3490 Malignant neoplasm of unspecified part of unspecified bronchus or lung: Secondary | ICD-10-CM

## 2023-09-11 ENCOUNTER — Inpatient Hospital Stay: Payer: Medicare Other | Admitting: Hematology

## 2023-09-11 ENCOUNTER — Other Ambulatory Visit (HOSPITAL_COMMUNITY): Payer: Self-pay

## 2023-09-11 ENCOUNTER — Inpatient Hospital Stay: Payer: Medicare Other | Attending: Hematology

## 2023-09-11 ENCOUNTER — Encounter: Payer: Self-pay | Admitting: Hematology

## 2023-09-11 ENCOUNTER — Inpatient Hospital Stay: Payer: Medicare Other

## 2023-09-11 VITALS — BP 96/63 | HR 99 | Temp 97.9°F | Resp 18 | Wt 131.9 lb

## 2023-09-11 DIAGNOSIS — Z79624 Long term (current) use of inhibitors of nucleotide synthesis: Secondary | ICD-10-CM | POA: Insufficient documentation

## 2023-09-11 DIAGNOSIS — J40 Bronchitis, not specified as acute or chronic: Secondary | ICD-10-CM

## 2023-09-11 DIAGNOSIS — Z8501 Personal history of malignant neoplasm of esophagus: Secondary | ICD-10-CM | POA: Insufficient documentation

## 2023-09-11 DIAGNOSIS — C3490 Malignant neoplasm of unspecified part of unspecified bronchus or lung: Secondary | ICD-10-CM | POA: Diagnosis not present

## 2023-09-11 DIAGNOSIS — C3491 Malignant neoplasm of unspecified part of right bronchus or lung: Secondary | ICD-10-CM

## 2023-09-11 DIAGNOSIS — Z923 Personal history of irradiation: Secondary | ICD-10-CM | POA: Insufficient documentation

## 2023-09-11 DIAGNOSIS — Z86711 Personal history of pulmonary embolism: Secondary | ICD-10-CM | POA: Diagnosis not present

## 2023-09-11 DIAGNOSIS — Z86718 Personal history of other venous thrombosis and embolism: Secondary | ICD-10-CM | POA: Insufficient documentation

## 2023-09-11 DIAGNOSIS — Z9221 Personal history of antineoplastic chemotherapy: Secondary | ICD-10-CM | POA: Diagnosis not present

## 2023-09-11 DIAGNOSIS — Z7901 Long term (current) use of anticoagulants: Secondary | ICD-10-CM | POA: Diagnosis not present

## 2023-09-11 DIAGNOSIS — Z79899 Other long term (current) drug therapy: Secondary | ICD-10-CM | POA: Insufficient documentation

## 2023-09-11 DIAGNOSIS — K521 Toxic gastroenteritis and colitis: Secondary | ICD-10-CM | POA: Diagnosis not present

## 2023-09-11 DIAGNOSIS — C7951 Secondary malignant neoplasm of bone: Secondary | ICD-10-CM | POA: Diagnosis not present

## 2023-09-11 DIAGNOSIS — Z87891 Personal history of nicotine dependence: Secondary | ICD-10-CM | POA: Diagnosis not present

## 2023-09-11 DIAGNOSIS — C155 Malignant neoplasm of lower third of esophagus: Secondary | ICD-10-CM

## 2023-09-11 LAB — CBC WITH DIFFERENTIAL (CANCER CENTER ONLY)
Abs Immature Granulocytes: 0.02 10*3/uL (ref 0.00–0.07)
Basophils Absolute: 0.1 10*3/uL (ref 0.0–0.1)
Basophils Relative: 1 %
Eosinophils Absolute: 0.2 10*3/uL (ref 0.0–0.5)
Eosinophils Relative: 2 %
HCT: 40.1 % (ref 36.0–46.0)
Hemoglobin: 13.5 g/dL (ref 12.0–15.0)
Immature Granulocytes: 0 %
Lymphocytes Relative: 19 %
Lymphs Abs: 1.8 10*3/uL (ref 0.7–4.0)
MCH: 33.2 pg (ref 26.0–34.0)
MCHC: 33.7 g/dL (ref 30.0–36.0)
MCV: 98.5 fL (ref 80.0–100.0)
Monocytes Absolute: 0.7 10*3/uL (ref 0.1–1.0)
Monocytes Relative: 7 %
Neutro Abs: 6.6 10*3/uL (ref 1.7–7.7)
Neutrophils Relative %: 71 %
Platelet Count: 274 10*3/uL (ref 150–400)
RBC: 4.07 MIL/uL (ref 3.87–5.11)
RDW: 14.1 % (ref 11.5–15.5)
WBC Count: 9.3 10*3/uL (ref 4.0–10.5)
nRBC: 0 % (ref 0.0–0.2)

## 2023-09-11 LAB — CMP (CANCER CENTER ONLY)
ALT: 7 U/L (ref 0–44)
AST: 19 U/L (ref 15–41)
Albumin: 3.5 g/dL (ref 3.5–5.0)
Alkaline Phosphatase: 50 U/L (ref 38–126)
Anion gap: 7 (ref 5–15)
BUN: 17 mg/dL (ref 8–23)
CO2: 28 mmol/L (ref 22–32)
Calcium: 11.1 mg/dL — ABNORMAL HIGH (ref 8.9–10.3)
Chloride: 103 mmol/L (ref 98–111)
Creatinine: 1.19 mg/dL — ABNORMAL HIGH (ref 0.44–1.00)
GFR, Estimated: 45 mL/min — ABNORMAL LOW (ref >60)
Glucose, Bld: 126 mg/dL — ABNORMAL HIGH (ref 70–99)
Potassium: 4.2 mmol/L (ref 3.5–5.1)
Sodium: 138 mmol/L (ref 135–145)
Total Bilirubin: 0.4 mg/dL (ref 0.3–1.2)
Total Protein: 7.3 g/dL (ref 6.5–8.1)

## 2023-09-11 MED ORDER — AZITHROMYCIN 250 MG PO TABS
ORAL_TABLET | ORAL | 0 refills | Status: DC
  Filled 2023-09-11: qty 6, 5d supply, fill #0

## 2023-09-11 MED ORDER — DENOSUMAB 120 MG/1.7ML ~~LOC~~ SOLN
120.0000 mg | Freq: Once | SUBCUTANEOUS | Status: AC
  Administered 2023-09-11: 120 mg via SUBCUTANEOUS
  Filled 2023-09-11: qty 1.7

## 2023-09-11 MED ORDER — FENTANYL 25 MCG/HR TD PT72
1.0000 | MEDICATED_PATCH | TRANSDERMAL | 0 refills | Status: DC
  Filled 2023-09-11: qty 10, 30d supply, fill #0

## 2023-09-11 MED ORDER — PREGABALIN 75 MG PO CAPS
75.0000 mg | ORAL_CAPSULE | Freq: Two times a day (BID) | ORAL | 1 refills | Status: DC
  Filled 2023-09-11: qty 60, 30d supply, fill #0

## 2023-09-11 NOTE — Progress Notes (Signed)
HEMATOLOGY ONCOLOGY CLINIC VISIT NOTE  Date of service:  09/11/23    Patient Care Team: Melida Quitter, PA as PCP - General (Family Medicine) Johney Maine, MD as Consulting Physician (Hematology and Oncology)  CHIEF COMPLAINTS/PURPOSE OF CONSULTATION:  Follow-up for continued evaluation and management of metastatic lung cancer Follow-up for esophageal cancer  DIAGNOSIS:   #1 Metastatic non-small cell lung cancer with bilateral lung nodules and large metastatic lesion in the left Ilium. 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 Also had c diff colitis - treated   Current Treatment  1) Active surveillance 2) Xgeva 120mg  [place] q12weeks for bone metastases. 3) Sandostatin q4weeks for diarrhea   Previous Treatment  For metastatic lung cancer 1 Palliative radiation therapy to the large left ilium metastases 2. IV Nivolumab x 20 cycles (discontinued due to likely immune colitis) 3. Xgeva 120mg  [place] q4weeks for bone metastases.  For Esophageal adenocarcinoma S/p Concurrent carbo/taxol + RT  HISTORY OF PRESENTING ILLNESS: (plz see my previous consultation for details of initial presentation)  INTERVAL HISTORY:  Patient is here for continued evaluation and management of pleural metastatic lung cancer and esophageal cancer and her diffuse.  Patient was last seen by me on 06/19/2023 and she was doing well overall.   Patient is accompanied by her family member during this visit. She notes she has been doing well since our last visit. She does complain of right eye pain due to fungal infection. Patient notes she is unable to see anything from her right eye. Patient has an appointment with Ophthalmologist at [facility].   She has been using fentanyl patch for her pain. Patient does not take Oxycodone.   She is complaint with all of her medications. She regularly takes Gabapentin as prescribed without any new or severe  toxicities.  She denies any new infection issues, fever, chills, night sweats, abdominal pain, chest pain, SOB, bone pain, abnormal bowel movement, or leg swelling. She also complains of constant cough with yellow and clear phlegm. Patient reports of occasional mild diarrhea. She also complains of chronic back pain.   Patient notes she had one episode of mild lower abdominal pain with diarrhea since our last visit.   Patient notes she is not drinking enough water.   Patient notes she finished her dental workup around 1 month ago and has recovered well.   Patient's blood pressure is low at 96/63 with pulse rate of 99 during this visit.   MEDICAL HISTORY:  Past Medical History:  Diagnosis Date   Barrett's esophagus    Bilateral pulmonary embolism (HCC) 09/02/2016   09/02/16 bilateral pulmonary emboli in context of extensive bilateral lower extremity deep venous thromboses Assumed hypercoagulability due to non-small cell metastatic lung cancer Lifelong anticoagulation recommended   Bone neoplasm 06/24/2015   Cancer (HCC)    metastatic poorly differentiated carcinoma. tumor left groin surgical removal with radiation tx.   Cataract    BILATERAL   Cigarette smoker two packs a day or less    Currently still smoking 2 PPD - Not interested in quitting at this time.   Colitis 2017   Colon polyps    hyperplastic, tubular adenomas, tubulovillous adenoma   Cough, persistent    hx. lung cancer ? primary-being evaluated, unsure of primary site.   Depression 06/24/2015   Diverticulosis    DVT of lower extremity, bilateral (HCC) 09/02/2016   Dysrhythmia    PAF in setting of submassive PE  09/2016   Emphysema of lung (HCC)    Endometriosis    Hysterectomy with BSO at age 48 yrs   Esophageal adenocarcinoma (HCC) 08/11/2015   intramucosal   Gastritis    GERD (gastroesophageal reflux disease)    H/O: pneumonia    Hiatal hernia    Hyperglycemia    A1c 7.4% 06/20/22   Hyperlipidemia     Hypertension 06/24/2015   likely improved incidental to 40 lbs weight loss from her neoplasm. No Longer taking med for this as of 08-06-15   IBS (irritable bowel syndrome)    Lung cancer (HCC)    suspected IV lung cancer with left ileum mets 2017, s/p left ileum radiaiton, immunotherapy   Pain    left hip-persistent"tumor of bone"-radiation tx. 10.   Pneumonia    Vitamin D deficiency disease    SURGICAL HISTORY: Past Surgical History:  Procedure Laterality Date   ABDOMINAL HYSTERECTOMY     BALLOON DILATION N/A 10/08/2019   Procedure: BALLOON DILATION;  Surgeon: Beverley Fiedler, MD;  Location: [facility];  Service: Gastroenterology;  Laterality: N/A;   BARTHOLIN GLAND CYST EXCISION  82 yo ago   Does not want if it was an infected cyst or tumor. Was soon as delivery   BIOPSY  01/02/2019   Procedure: BIOPSY;  Surgeon: Beverley Fiedler, MD;  Location: [facility];  Service: Gastroenterology;;   CATARACT EXTRACTION     COLONOSCOPY W/ POLYPECTOMY     multiple times - last done 09/2014 per patient.   ESOPHAGOGASTRODUODENOSCOPY (EGD) WITH PROPOFOL N/A 08/11/2015   Procedure: ESOPHAGOGASTRODUODENOSCOPY (EGD) WITH PROPOFOL;  Surgeon: Beverley Fiedler, MD;  Location: [facility];  Service: Gastroenterology;  Laterality: N/A;   ESOPHAGOGASTRODUODENOSCOPY (EGD) WITH PROPOFOL N/A 01/02/2019   Procedure: ESOPHAGOGASTRODUODENOSCOPY (EGD) WITH PROPOFOL;  Surgeon: Beverley Fiedler, MD;  Location: [facility];  Service: Gastroenterology;  Laterality: N/A;   ESOPHAGOGASTRODUODENOSCOPY (EGD) WITH PROPOFOL N/A 10/08/2019   Procedure: ESOPHAGOGASTRODUODENOSCOPY (EGD) WITH PROPOFOL;  Surgeon: Beverley Fiedler, MD;  Location: [facility];  Service: Gastroenterology;  Laterality: N/A;   EYE SURGERY Right 2024   Multiple surgeries over the past year.   FLEXIBLE SIGMOIDOSCOPY N/A 06/24/2017   Procedure: FLEXIBLE SIGMOIDOSCOPY;  Surgeon: Ruffin Frederick, MD;  Location: [facility];  Service: Gastroenterology;   Laterality: N/A;   GANGLION CYST EXCISION     KNEE ARTHROSCOPY  age about 53 yrs   TONSILLECTOMY     TOTAL ABDOMINAL HYSTERECTOMY W/ BILATERAL SALPINGOOPHORECTOMY  at age 1 yrs   For endometriosis   VIDEO BRONCHOSCOPY WITH ENDOBRONCHIAL ULTRASOUND Bilateral 06/06/2023   Procedure: VIDEO BRONCHOSCOPY WITH ENDOBRONCHIAL ULTRASOUND;  Surgeon: Josephine Igo, DO;  Location: [facility];  Service: Cardiopulmonary;  Laterality: Bilateral;    SOCIAL HISTORY: Social History   Socioeconomic History   Marital status: Widowed    Spouse name: Not on file   Number of children: 2   Years of education: Not on file   Highest education level: Not on file  Occupational History   Not on file  Tobacco Use   Smoking status: Former    Current packs/day: 0.00    Average packs/day: 1 pack/day for 60.0 years (60.0 ttl pk-yrs)    Types: Cigarettes    Start date: 12/05/1954    Quit date: 12/05/2014    Years since quitting: 8.7    Passive exposure: Never   Smokeless tobacco: Never  Vaping Use   Vaping status: Never Used  Substance and Sexual Activity   Alcohol use: No  Alcohol/week: 0.0 standard drinks of alcohol   Drug use: No   Sexual activity: Not Currently  Other Topics Concern   Not on file  Social History Narrative   Not on file   Social Determinants of Health   Financial Resource Strain: Low Risk  (03/28/2023)   Overall Financial Resource Strain (CARDIA)    Difficulty of Paying Living Expenses: Not hard at all  Food Insecurity: No Food Insecurity (03/28/2023)   Hunger Vital Sign    Worried About Running Out of Food in the Last Year: Never true    Ran Out of Food in the Last Year: Never true  Transportation Needs: No Transportation Needs (03/28/2023)   PRAPARE - Administrator, Civil Service (Medical): No    Lack of Transportation (Non-Medical): No  Physical Activity: Inactive (03/28/2023)   Exercise Vital Sign    Days of Exercise per Week: 0 days    Minutes of Exercise  per Session: 0 min  Stress: No Stress Concern Present (03/28/2023)   [employer] of Occupational Health - Occupational Stress Questionnaire    Feeling of Stress : Not at all  Social Connections: Unknown (03/28/2023)   Social Connection and Isolation Panel [NHANES]    Frequency of Communication with Friends and Family: Patient declined    Frequency of Social Gatherings with Friends and Family: Patient declined    Attends Religious Services: Patient declined    Database administrator or Organizations: Patient declined    Attends Banker Meetings: Patient declined    Marital Status: Widowed  Intimate Partner Violence: Not At Risk (03/28/2023)   Humiliation, Afraid, Rape, and Kick questionnaire    Fear of Current or Ex-Partner: No    Emotionally Abused: No    Physically Abused: No    Sexually Abused: No    FAMILY HISTORY: Family History  Problem Relation Age of Onset   Colon cancer Brother    Colon cancer Brother    Stroke Mother    Colon cancer Father    Emphysema Father        smoked   Breast cancer Daughter 68       ER/PR+ stage II    ALLERGIES:  is allergic to penicillins, remeron [mirtazapine], and latex. patient wonders if she has a penicillin allergy but notes that she is uncertain about this.  MEDICATIONS:  Current Outpatient Medications  Medication Sig Dispense Refill   acetaminophen (TYLENOL) 325 MG tablet Take 2 tablets (650 mg total) by mouth every 6 (six) hours as needed for mild pain (or Fever >/= 101). 20 tablet 0   acidophilus (RISAQUAD) CAPS capsule Take 1 capsule by mouth daily.     albuterol (VENTOLIN HFA) 108 (90 Base) MCG/ACT inhaler Inhale 2 puffs into the lungs every 6 (six) hours as needed for wheezing. (Patient not taking: Reported on 06/13/2023) 2 each 11   apixaban (ELIQUIS) 2.5 MG TABS tablet Take 1 tablet (2.5 mg total) by mouth 2 (two) times daily. 60 tablet 2   BREZTRI AEROSPHERE 160-9-4.8 MCG/ACT AERO INHALE 2 PUFFS INTO THE LUNGS  TWICE A DAY 10.7 each 1   buPROPion (WELLBUTRIN XL) 150 MG 24 hr tablet TAKE 1 TABLET BY MOUTH EVERY DAY 30 tablet 0   Cyanocobalamin (B-12) 2500 MCG TABS Take 2,500 mcg by mouth daily. (Patient not taking: Reported on 06/13/2023)     DULoxetine (CYMBALTA) 30 MG capsule Take 3 capsules (90 mg total) by mouth daily. 120 capsule 3   feeding supplement (  ENSURE ENLIVE / ENSURE PLUS) LIQD Take 237 mLs by mouth 2 (two) times daily between meals. (Patient not taking: Reported on 06/02/2023) 237 mL 12   fentaNYL (DURAGESIC) 12 MCG/HR Place 1 patch onto the skin every 3 (three) days. 10 patch 0   gabapentin (NEURONTIN) 300 MG capsule Take 300 mg by mouth daily.     gabapentin (NEURONTIN) 600 MG tablet TAKE 1 TABLET BY MOUTH THREE TIMES A DAY 270 tablet 0   guaiFENesin 200 MG tablet Take 1 tablet (200 mg total) by mouth every 4 (four) hours as needed for cough or to loosen phlegm. 30 suppository 0   HYDROcodone-acetaminophen (NORCO) 5-325 MG tablet Take 1 tablet by mouth every 6 (six) hours as needed for moderate pain. 30 tablet 0   neomycin-polymyxin b-dexamethasone (MAXITROL) 3.5-10000-0.1 SUSP Place 1 drop into the right eye 4 (four) times daily.     omeprazole (PRILOSEC) 40 MG capsule TAKE 1 CAPSULE BY MOUTH EVERY DAY BEFORE BREAKFAST 90 capsule 1   ondansetron (ZOFRAN-ODT) 4 MG disintegrating tablet Take 1 tablet (4 mg total) by mouth every 8 (eight) hours as needed for nausea or vomiting. 20 tablet 0   sulfamethoxazole-trimethoprim (BACTRIM DS) 800-160 MG tablet Take 1 tablet by mouth 2 (two) times daily. 20 tablet 0   valACYclovir (VALTREX) 500 MG tablet TAKE 1 TABLET BY MOUTH TWICE A DAY 180 tablet 3   No current facility-administered medications for this visit.   REVIEW OF SYSTEMS:  10 Point review of Systems was done is negative except as noted above.   PHYSICAL EXAMINATION:  .BP 96/63   Pulse 99   Temp 97.9 F (36.6 C)   Resp 18   Wt 131 lb 14.4 oz (59.8 kg)   SpO2 95%   BMI 20.06 kg/m    GENERAL:alert, in no acute distress and comfortable SKIN: rt eyelide sx changes EYES: conjunctiva are pink and non-injected, sclera anicteric OROPHARYNX: MMM, no exudates, no oropharyngeal erythema or ulceration NECK: supple, no JVD LYMPH:  no palpable lymphadenopathy in the cervical, axillary or inguinal regions LUNGS: clear to auscultation b/l with normal respiratory effort HEART: regular rate & rhythm ABDOMEN:  normoactive bowel sounds , non tender, not distended. Extremity: no pedal edema PSYCH: alert & oriented x 3 with fluent speech NEURO: no focal motor/sensory deficits    LABORATORY DATA:  I have reviewed the data as listed  .    Latest Ref Rng & Units 09/11/2023    1:32 PM 06/19/2023    1:35 PM 06/06/2023   12:05 PM  CBC  WBC 4.0 - 10.5 K/uL 9.3  11.2  10.4   Hemoglobin 12.0 - 15.0 g/dL 15.1  76.1  60.7   Hematocrit 36.0 - 46.0 % 40.1  36.3  37.8   Platelets 150 - 400 K/uL 274  365  317    ANC 1.8k .    Latest Ref Rng & Units 09/11/2023    1:32 PM 06/19/2023    1:35 PM 06/06/2023   12:05 PM  CMP  Glucose 70 - 99 mg/dL 371  062  694   BUN 8 - 23 mg/dL 17  14  11    Creatinine 0.44 - 1.00 mg/dL 8.54  6.27  0.35   Sodium 135 - 145 mmol/L 138  139  139   Potassium 3.5 - 5.1 mmol/L 4.2  4.6  3.8   Chloride 98 - 111 mmol/L 103  110  109   CO2 22 - 32 mmol/L 28  21  21   Calcium 8.9 - 10.3 mg/dL 60.4  9.5  8.4   Total Protein 6.5 - 8.1 g/dL 7.3  7.1    Total Bilirubin 0.3 - 1.2 mg/dL 0.4  0.3    Alkaline Phos 38 - 126 U/L 50  58    AST 15 - 41 U/L 19  16    ALT 0 - 44 U/L 7  6          01/02/19 Esophagus Biopsy:    RADIOGRAPHIC STUDIES: .No results found.  ASSESSMENT & PLAN:   82 y.o. female with  #1 Metastatic poorly differentiated carcinoma with likely lung primary non-small cell lung cancer.    CT of the head with and without contrast showed no evidence of metastatic disease. EGFR blood test mutation analysis negative. CT chest abdomen pelvis  04/19/2016 shows no evidence of disease progression. Patient tolerated Nivolumab very well but was discontinued when she developed grade 2 Immune colitis. Has been off Nivolumab for >6 months  CT chest abdomen pelvis on 06/24/2016 shows no evidence of new disease or progression of metastatic disease. CT chest abdomen pelvis 09/06/2016 shows 1. Mixed interval response to therapy. 2. There is a new left ventral chest wall lesion deep to the pectoralis musculature worrisome for metastatic disease. 3. Posterior lower lobe nodular densities are identified which may reflect areas of pulmonary metastasis. 4. Interval decrease in size of destructive lesion involving the left iliac bone.  CT chest abd pelvis 12/08/2016: Cystic mass involving the left ventral chest wall has resolved in the interval. Likely was a hematoma due to trauma. Interval increase in size of pleural base mass overlying the posterior and inferior left lower lobe. There is also a new left pleural effusion identified.  CT chest 02/01/2017: Residual irregular soft tissue thickening/volume loss and trace left pleural fluid at the base of the left hemithorax, overall improved in appearance from 12/08/2016. No measurable lesion.  CT chest 05/29/2017 shows no residual pleural based mass or significant pleural effusion in the left hemithorax. No evidence of thoracic metastatic disease. No evidence of progressive metastatic disease within the abdomen or pelvis. Mixed lytic and blastic lesion involving the left iliac bone and associated pathologic fracture are unchanged.   CT CAP 09/14/17 shows no new changes. She does have slight displacement of her fractured left iliac bone. Evidence of stable disease.   CT CAP 01/04/2018- No new or progressive metastatic disease. Stable large left iliac bone metastasis with associated chronic pathologic fracture.   CT chest/abd/pelvis done on 04/26/18 revealed Stable exam.  No new or progressive interval  findings.  07/19/18 CT C/A/P revealed Stable exam.  No new or progressive interval findings. Large destructive left iliac lesion is similar to prior. Aortic Atherosclerosis and Emphysema.    11/06/18 CT C/A/P revealed Similar appearance of large mixed lytic and sclerotic lesion in the left ilium. No new metastatic lesions are otherwise noted elsewhere in the chest, abdomen or pelvis. 2. Interval development of thickening of the distal third of the esophagus. This is nonspecific, and could be related to underlying reflux esophagitis. However, if there is any clinical concern for Barrett's metaplasia or esophageal neoplasia, further evaluation with nonemergent endoscopy could be considered. 3. Aortic atherosclerosis, in addition to left main coronary artery disease. Assessment for potential risk factor modification, dietary therapy or pharmacologic therapy may be warranted, if clinically Indicated. 4. Diffuse bronchial wall thickening with mild to moderate centrilobular and paraseptal emphysema; imaging findings suggestive of underlying COPD. 5. Additional incidental  findings, as above.  #2  Adenocarcinoma of the Esophagus  Barrett's esophagus 4cms in the distal esophagus with low and high-grade dysplasia  01/02/19 Surgical pathology revealed adenocarcinoma of the esophagus   01/25/19 PET/CT revealed Distal esophageal primary, without hypermetabolic metastatic disease. 2. Chronic left iliac metastasis, as before. 3. Hypermetabolism within and superficial to the right gluteal musculature is most likely related to trauma and/or injection sites. 4. Aortic atherosclerosis, coronary artery atherosclerosis and emphysema.  S/p concurrent Carboplatin and Taxol weekly with RT of 45 Gy in 25 fractions and 5.4 Gy boost, completed between 02/04/19 and 03/27/19  07/03/2019 PET skull base to thigh revealed "1. Interval response to therapy. Significant reduction in FDG uptake associated with distal esophageal mass. SUV max  currently 2.61 versus 16.97 previously. 2. 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 Unchanged 3. Aortic Atherosclerosis (ICD10-I70.0) and Emphysema (ICD10-J43.9). Coronary artery calcifications."  07/15/2020 CT C/A/P ([id number]) ([id number]) revealed "1. No evidence of new or progressive metastatic disease in the chest, abdomen or pelvis."  #3 diarrhea-  now resolved was previously. 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 She was previously on on Lialda, budesonide,probiotics and lomotil but not currently taking any of these. Plan -Continue Sandostatin every 4 weeks   #4 h/o DVT and PE  -continue on Xarelto - no issues with bleeding   #5 history of COPD management per primary care physician  #6 severe shingles of the forehead with postherpetic neuralgia  PLAN: -Discussed lab results from today, 09/11/2023, in detail with the patient. CBC is stable. CMP shows elevated creatinine at 1.19 and elevated calcium level of 11.1 mg/dL.  -Discussed the option to increase the dosage of fentanyl patch to see if it improves her pain. Patient agrees.  -Recommend to drink more water, around 2 L of water.  -We will start Xgeva injection as well since her dental workup is completed and the patient has recovered well from her dental workup.  -Discussed with the patient that her blood pressure is low during this visit since she is dehydrated.  -Discussed the option to change Gabapentin to Lyrica and patient agrees.  -Will prescribe Lyrica.  -Discussed the option of bone scan. Pt agrees.  -Schedule the patient for bone scan.  -Will prescribe antibiotic due to concerns for infection.  -Recommend influenza vaccine, COVID-19 Booster, RSV vaccine, and other age related vaccines.   -Whole body bone scan to evaluate new hypercalcemia -Restart Xgeva every 12 weeks from  today x 4 -RTC with Dr Candise Che in 3 weeks with port flush and labs  FOLLOW-UP: Whole body bone scan to evaluate new hypercalcemia Restart Xgeva every 12 weeks from today x 4 RTC with Dr Candise Che in 3 weeks with port flush and labs  The total time spent in the appointment was 32 minutes* .  All of the patient's questions were answered with apparent satisfaction. The patient knows to call the clinic with any problems, questions or concerns.   Wyvonnia Lora MD MS AAHIVMS [facility] Hematology/Oncology Physician [facility]  .*Total Encounter Time as defined by the Centers for Medicare and Medicaid Services includes, in addition to the face-to-face time of a patient visit (documented in the note above) non-face-to-face time: obtaining and reviewing outside history, ordering and reviewing medications, tests or procedures, care coordination (communications with other health care professionals or caregivers) and documentation in the medical record.  I,Param Shah,acting as a Neurosurgeon for Wyvonnia Lora, MD.,have documented all relevant documentation on the behalf of Wyvonnia Lora, MD,as directed by  Wyvonnia Lora, MD while in the presence of Wyvonnia Lora, MD.

## 2023-09-12 ENCOUNTER — Other Ambulatory Visit (HOSPITAL_COMMUNITY): Payer: Self-pay

## 2023-09-12 ENCOUNTER — Telehealth: Payer: Self-pay | Admitting: Hematology

## 2023-09-12 NOTE — Telephone Encounter (Signed)
Per Cindy Byrd 10/7 los patient is aware of scheduled appointment times/dates for follow up visit

## 2023-09-17 ENCOUNTER — Encounter: Payer: Self-pay | Admitting: Hematology

## 2023-09-19 ENCOUNTER — Ambulatory Visit (HOSPITAL_COMMUNITY)
Admission: RE | Admit: 2023-09-19 | Discharge: 2023-09-19 | Disposition: A | Discharge status: Home / Self Care | Payer: Medicare Other | Source: Ambulatory Visit | Attending: Acute Care | Admitting: Acute Care

## 2023-09-19 ENCOUNTER — Encounter (HOSPITAL_COMMUNITY): Payer: Self-pay

## 2023-09-19 DIAGNOSIS — Z9221 Personal history of antineoplastic chemotherapy: Secondary | ICD-10-CM | POA: Insufficient documentation

## 2023-09-19 DIAGNOSIS — C159 Malignant neoplasm of esophagus, unspecified: Secondary | ICD-10-CM | POA: Insufficient documentation

## 2023-09-19 DIAGNOSIS — R059 Cough, unspecified: Secondary | ICD-10-CM | POA: Insufficient documentation

## 2023-09-19 DIAGNOSIS — R911 Solitary pulmonary nodule: Secondary | ICD-10-CM | POA: Insufficient documentation

## 2023-09-19 DIAGNOSIS — J439 Emphysema, unspecified: Secondary | ICD-10-CM | POA: Insufficient documentation

## 2023-09-19 DIAGNOSIS — I700 Atherosclerosis of aorta: Secondary | ICD-10-CM | POA: Insufficient documentation

## 2023-09-19 DIAGNOSIS — R0602 Shortness of breath: Secondary | ICD-10-CM | POA: Insufficient documentation

## 2023-09-19 DIAGNOSIS — C7889 Secondary malignant neoplasm of other digestive organs: Secondary | ICD-10-CM | POA: Diagnosis not present

## 2023-09-19 DIAGNOSIS — I2510 Atherosclerotic heart disease of native coronary artery without angina pectoris: Secondary | ICD-10-CM | POA: Insufficient documentation

## 2023-09-19 DIAGNOSIS — C799 Secondary malignant neoplasm of unspecified site: Secondary | ICD-10-CM | POA: Insufficient documentation

## 2023-09-19 DIAGNOSIS — J432 Centrilobular emphysema: Secondary | ICD-10-CM | POA: Diagnosis not present

## 2023-09-19 DIAGNOSIS — Z923 Personal history of irradiation: Secondary | ICD-10-CM | POA: Insufficient documentation

## 2023-09-21 ENCOUNTER — Encounter (HOSPITAL_COMMUNITY)
Admission: RE | Admit: 2023-09-21 | Discharge: 2023-09-21 | Disposition: A | Discharge status: Home / Self Care | Payer: Medicare Other | Source: Ambulatory Visit | Attending: Hematology | Admitting: Hematology

## 2023-09-21 DIAGNOSIS — C3490 Malignant neoplasm of unspecified part of unspecified bronchus or lung: Secondary | ICD-10-CM | POA: Insufficient documentation

## 2023-09-21 DIAGNOSIS — Z85118 Personal history of other malignant neoplasm of bronchus and lung: Secondary | ICD-10-CM | POA: Diagnosis not present

## 2023-09-21 MED ORDER — TECHNETIUM TC 99M MEDRONATE IV KIT
20.0000 | PACK | Freq: Once | INTRAVENOUS | Status: AC | PRN
  Administered 2023-09-21: 20.5 via INTRAVENOUS

## 2023-09-29 ENCOUNTER — Other Ambulatory Visit: Payer: Self-pay

## 2023-09-29 DIAGNOSIS — C3491 Malignant neoplasm of unspecified part of right bronchus or lung: Secondary | ICD-10-CM

## 2023-10-03 ENCOUNTER — Encounter: Payer: Self-pay | Admitting: Acute Care

## 2023-10-03 ENCOUNTER — Ambulatory Visit (INDEPENDENT_AMBULATORY_CARE_PROVIDER_SITE_OTHER): Payer: Medicare Other | Admitting: Acute Care

## 2023-10-03 ENCOUNTER — Inpatient Hospital Stay: Payer: Medicare Other | Admitting: Hematology

## 2023-10-03 ENCOUNTER — Inpatient Hospital Stay: Payer: Medicare Other

## 2023-10-03 VITALS — BP 148/80 | HR 96 | Ht 68.0 in | Wt 135.6 lb

## 2023-10-03 VITALS — BP 130/74 | HR 91 | Temp 97.7°F | Resp 16 | Wt 136.8 lb

## 2023-10-03 DIAGNOSIS — Z79624 Long term (current) use of inhibitors of nucleotide synthesis: Secondary | ICD-10-CM | POA: Diagnosis not present

## 2023-10-03 DIAGNOSIS — Z8501 Personal history of malignant neoplasm of esophagus: Secondary | ICD-10-CM

## 2023-10-03 DIAGNOSIS — C3490 Malignant neoplasm of unspecified part of unspecified bronchus or lung: Secondary | ICD-10-CM

## 2023-10-03 DIAGNOSIS — Z923 Personal history of irradiation: Secondary | ICD-10-CM | POA: Diagnosis not present

## 2023-10-03 DIAGNOSIS — Z86718 Personal history of other venous thrombosis and embolism: Secondary | ICD-10-CM | POA: Diagnosis not present

## 2023-10-03 DIAGNOSIS — K521 Toxic gastroenteritis and colitis: Secondary | ICD-10-CM | POA: Diagnosis not present

## 2023-10-03 DIAGNOSIS — J479 Bronchiectasis, uncomplicated: Secondary | ICD-10-CM | POA: Diagnosis not present

## 2023-10-03 DIAGNOSIS — Z87891 Personal history of nicotine dependence: Secondary | ICD-10-CM | POA: Diagnosis not present

## 2023-10-03 DIAGNOSIS — J449 Chronic obstructive pulmonary disease, unspecified: Secondary | ICD-10-CM | POA: Diagnosis not present

## 2023-10-03 DIAGNOSIS — Z85118 Personal history of other malignant neoplasm of bronchus and lung: Secondary | ICD-10-CM

## 2023-10-03 DIAGNOSIS — R918 Other nonspecific abnormal finding of lung field: Secondary | ICD-10-CM | POA: Diagnosis not present

## 2023-10-03 DIAGNOSIS — C3491 Malignant neoplasm of unspecified part of right bronchus or lung: Secondary | ICD-10-CM

## 2023-10-03 DIAGNOSIS — Z7901 Long term (current) use of anticoagulants: Secondary | ICD-10-CM | POA: Diagnosis not present

## 2023-10-03 DIAGNOSIS — C7951 Secondary malignant neoplasm of bone: Secondary | ICD-10-CM | POA: Diagnosis not present

## 2023-10-03 DIAGNOSIS — Z79899 Other long term (current) drug therapy: Secondary | ICD-10-CM | POA: Diagnosis not present

## 2023-10-03 DIAGNOSIS — Z86711 Personal history of pulmonary embolism: Secondary | ICD-10-CM | POA: Diagnosis not present

## 2023-10-03 DIAGNOSIS — Z9221 Personal history of antineoplastic chemotherapy: Secondary | ICD-10-CM | POA: Diagnosis not present

## 2023-10-03 LAB — CBC WITH DIFFERENTIAL (CANCER CENTER ONLY)
Abs Immature Granulocytes: 0.02 10*3/uL (ref 0.00–0.07)
Basophils Absolute: 0.1 10*3/uL (ref 0.0–0.1)
Basophils Relative: 1 %
Eosinophils Absolute: 0.2 10*3/uL (ref 0.0–0.5)
Eosinophils Relative: 2 %
HCT: 37.3 % (ref 36.0–46.0)
Hemoglobin: 12.4 g/dL (ref 12.0–15.0)
Immature Granulocytes: 0 %
Lymphocytes Relative: 30 %
Lymphs Abs: 2.7 10*3/uL (ref 0.7–4.0)
MCH: 32.5 pg (ref 26.0–34.0)
MCHC: 33.2 g/dL (ref 30.0–36.0)
MCV: 97.9 fL (ref 80.0–100.0)
Monocytes Absolute: 0.8 10*3/uL (ref 0.1–1.0)
Monocytes Relative: 9 %
Neutro Abs: 5.2 10*3/uL (ref 1.7–7.7)
Neutrophils Relative %: 58 %
Platelet Count: 286 10*3/uL (ref 150–400)
RBC: 3.81 MIL/uL — ABNORMAL LOW (ref 3.87–5.11)
RDW: 13.2 % (ref 11.5–15.5)
WBC Count: 8.9 10*3/uL (ref 4.0–10.5)
nRBC: 0 % (ref 0.0–0.2)

## 2023-10-03 LAB — CMP (CANCER CENTER ONLY)
ALT: 7 U/L (ref 0–44)
AST: 18 U/L (ref 15–41)
Albumin: 3.5 g/dL (ref 3.5–5.0)
Alkaline Phosphatase: 49 U/L (ref 38–126)
Anion gap: 5 (ref 5–15)
BUN: 17 mg/dL (ref 8–23)
CO2: 26 mmol/L (ref 22–32)
Calcium: 9.7 mg/dL (ref 8.9–10.3)
Chloride: 110 mmol/L (ref 98–111)
Creatinine: 0.97 mg/dL (ref 0.44–1.00)
GFR, Estimated: 58 mL/min — ABNORMAL LOW (ref >60)
Glucose, Bld: 190 mg/dL — ABNORMAL HIGH (ref 70–99)
Potassium: 4 mmol/L (ref 3.5–5.1)
Sodium: 141 mmol/L (ref 135–145)
Total Bilirubin: 0.4 mg/dL (ref 0.3–1.2)
Total Protein: 7.2 g/dL (ref 6.5–8.1)

## 2023-10-03 MED ORDER — PREGABALIN 100 MG PO CAPS: 100.0000 mg | ORAL_CAPSULE | Freq: Two times a day (BID) | ORAL | 1 refills | Status: DC

## 2023-10-03 NOTE — Patient Instructions (Addendum)
It is good to see you today. Your Ct chest shows stable lung nodules.  We will do a 12 month follow up CT Chest to reassess these. I have placed the order , it will be due 09/2024. You will get a call to get this scheduled.  We will  scheduled you for an inogen walk to see if you qualify for the portable oxygen. If you qualify , we will place an order.  Continue Breztri 2 puffs , twice daily , rinse mouth after use. Remember to use Albuterol inhaler as needed for breakthrough shortness of breath or wheezing. Do not use more than 3-4 times a day.  You do have refills. We will send you home with a flutter valve to help with your secretions. Continue Mucinex daily with a full glass of water as you have been doing. Follow up in 12 months or sooner as needed  Please contact office for sooner follow up if symptoms do not improve or worsen or seek emergency care

## 2023-10-03 NOTE — Addendum Note (Signed)
Addended by: Carnella Guadalajara on: 10/03/2023 02:41 PM   Modules accepted: Orders

## 2023-10-03 NOTE — Progress Notes (Signed)
History of Present Illness Cindy Byrd is a 82 y.o. female  former smoker quit 2016 with a 60 pack year smoking history, past medical history of esophageal cancer as well as a history of lung cancer treated in 2017 followed by the cancer center.She also has a history of COPD, which she uses Breztri and albuterol to treat.  Synopsis 82 year old female, former smoker quit 2016 with a 60 pack year smoking history, past medical history of esophageal cancer as well as a history of lung cancer treated in 2017 followed by the cancer center. Also has a history of endometriosis status post hysterectomy, gastroesophageal reflux, hyperlipidemia and hypertension. Has a history of shortness of breath with exertion and is currently on Breztri for treatment of COPD. Was referred after having CT of the chest that shows something within the left mainstem concerning for an endobronchial lesion as well as something invading the left pulmonary artery and hilum concerning for malignancy. Patient was agreeable to proceed with bronchoscopy for evaluation. Currently anticoagulated. With the setting of history of DVT. She was seen by Dr. Tonia Brooms 05/23/2023 and decision was made for bronchoscopy with biopsies of the left mainstem bronchus. She underwent Flexible video fiberoptic bronchoscopy with endobronchial ultrasound and biopsies on 06/06/2023.  The procedure was aborted as it appeared that the nodule of concern was a mucus plug, vs a potential malignancy. Plan was for a 3 month follow up CT Chest for surveillance , which was done 09/19/2023. Sh is here today to review the results.  10/03/2023 Pt. Presents for follow up after CT Chest done as surveillance for abnormal lung nodules. She underwent bronchoscopy 06/2023, and it was aborted as it appeared that the nodule of concern was a mucus plug. Follow up CT Chest 09/2023 shows stable nodules no greater than 5 mm.  We will do a follow up scan in 12 months to ensure she is  continuing to do well. Pt. Is currently using her Breztri daily. She would like to be assessed for an [employer] portable oxygen tank. She would like to be more mobile. We will make an appointment for a walk with an [employer] in the near future to get her qualified. If she passes we will get it ordered through insurance.  We did discuss using her albuterol for rescue. She has not been doing this and she does endorse dyspnea on exertion. She also states she has a hard time getting secretions up despite using Mucinex daily. We will add a flutter valve and I have reminded her to make sure she takes her Mucinex with a full glass of water each day.   Test Results: CT Chest without Contrast No evidence of recurrent or metastatic disease. 2. Liver margin is slightly irregular, indicative of cirrhosis. 3. Aortic atherosclerosis (ICD10-I70.0). Coronary artery calcification. 4. Enlarged pulmonic trunk, indicative of pulmonary arterial hypertension. 5.  Emphysema (ICD10-J43.9).     Latest Ref Rng & Units 09/11/2023    1:32 PM 06/19/2023    1:35 PM 06/06/2023   12:05 PM  CBC  WBC 4.0 - 10.5 K/uL 9.3  11.2  10.4   Hemoglobin 12.0 - 15.0 g/dL 16.1  09.6  04.5   Hematocrit 36.0 - 46.0 % 40.1  36.3  37.8   Platelets 150 - 400 K/uL 274  365  317        Latest Ref Rng & Units 09/11/2023    1:32 PM 06/19/2023    1:35 PM 06/06/2023   12:05 PM  BMP  Glucose 70 - 99 mg/dL 161  096  045   BUN 8 - 23 mg/dL 17  14  11    Creatinine 0.44 - 1.00 mg/dL 4.09  8.11  9.14   Sodium 135 - 145 mmol/L 138  139  139   Potassium 3.5 - 5.1 mmol/L 4.2  4.6  3.8   Chloride 98 - 111 mmol/L 103  110  109   CO2 22 - 32 mmol/L 28  21  21    Calcium 8.9 - 10.3 mg/dL 78.2  9.5  8.4     BNP    Component Value Date/Time   BNP 46.6 02/25/2019 1400    ProBNP    Component Value Date/Time   PROBNP 212.0 (H) 04/15/2019 1556    PFT No results found for: "FEV1PRE", "FEV1POST", "FVCPRE", "FVCPOST", "TLC", "DLCOUNC",  "PREFEV1FVCRT", "PSTFEV1FVCRT"  CT CHEST WO CONTRAST  Result Date: 09/28/2023 CLINICAL DATA:  Metastatic esophageal cancer treated with immunotherapy, chemotherapy and radiation therapy. Cough and shortness of breath. * Tracking Code: BO * EXAM: CT CHEST WITHOUT CONTRAST TECHNIQUE: Multidetector CT imaging of the chest was performed following the standard protocol without IV contrast. RADIATION DOSE REDUCTION: This exam was performed according to the departmental dose-optimization program which includes automated exposure control, adjustment of the mA and/or kV according to patient size and/or use of iterative reconstruction technique. COMPARISON:  05/04/2023. FINDINGS: Cardiovascular: Right IJ Port-A-Cath terminates in the SVC. Atherosclerotic calcification of the aorta and coronary arteries. Enlarged pulmonic trunk. Heart size normal. No pericardial effusion. Mediastinum/Nodes: No pathologically enlarged mediastinal or axillary lymph nodes. Hilar regions are difficult to definitively evaluate without IV contrast. Esophagus is grossly unremarkable. Lungs/Pleura: Centrilobular emphysema. Scattered pulmonary parenchymal scarring. A few scattered pulmonary nodules measure 5 mm or less in size, unchanged. These can be reassessed on routine follow-up imaging. No pleural fluid. Debris in the airway. Upper Abdomen: Liver margin is slightly irregular. Probable small cysts in the right hepatic lobe. Disorder splenic contour, as before. Elevated left hemidiaphragm. Visualized portions of the liver, gallbladder, adrenal glands, kidneys, spleen, pancreas, stomach and bowel are otherwise grossly unremarkable. No upper abdominal adenopathy. Musculoskeletal: Old rib fractures. Degenerative changes in the spine. No worrisome lytic or sclerotic lesions. IMPRESSION: 1. No evidence of recurrent or metastatic disease. 2. Liver margin is slightly irregular, indicative of cirrhosis. 3. Aortic atherosclerosis (ICD10-I70.0). Coronary  artery calcification. 4. Enlarged pulmonic trunk, indicative of pulmonary arterial hypertension. 5.  Emphysema (ICD10-J43.9). Electronically Signed   By: Leanna Battles M.D.   On: 09/28/2023 15:25     Past medical hx Past Medical History:  Diagnosis Date   Barrett's esophagus    Bilateral pulmonary embolism (HCC) 09/02/2016   09/02/16 bilateral pulmonary emboli in context of extensive bilateral lower extremity deep venous thromboses Assumed hypercoagulability due to non-small cell metastatic lung cancer Lifelong anticoagulation recommended   Bone neoplasm 06/24/2015   Cancer (HCC)    metastatic poorly differentiated carcinoma. tumor left groin surgical removal with radiation tx.   Cataract    BILATERAL   Cigarette smoker two packs a day or less    Currently still smoking 2 PPD - Not interested in quitting at this time.   Colitis 2017   Colon polyps    hyperplastic, tubular adenomas, tubulovillous adenoma   Cough, persistent    hx. lung cancer ? primary-being evaluated, unsure of primary site.   Depression 06/24/2015   Diverticulosis    DVT of lower extremity, bilateral (HCC) 09/02/2016   Dysrhythmia    PAF in  setting of submassive PE 09/2016   Emphysema of lung (HCC)    Endometriosis    Hysterectomy with BSO at age 38 yrs   Esophageal adenocarcinoma (HCC) 08/11/2015   intramucosal   Gastritis    GERD (gastroesophageal reflux disease)    H/O: pneumonia    Hiatal hernia    Hyperglycemia    A1c 7.4% 06/20/22   Hyperlipidemia    Hypertension 06/24/2015   likely improved incidental to 40 lbs weight loss from her neoplasm. No Longer taking med for this as of 08-06-15   IBS (irritable bowel syndrome)    Lung cancer (HCC)    suspected IV lung cancer with left ileum mets 2017, s/p left ileum radiaiton, immunotherapy   Pain    left hip-persistent"tumor of bone"-radiation tx. 10.   Pneumonia    Vitamin D deficiency disease      Social History   Tobacco Use   Smoking status:  Former    Current packs/day: 0.00    Average packs/day: 1 pack/day for 60.0 years (60.0 ttl pk-yrs)    Types: Cigarettes    Start date: [birth year]/05/1954    Quit date: 12/05/2014    Years since quitting: 8.8    Passive exposure: Never   Smokeless tobacco: Never  Vaping Use   Vaping status: Never Used  Substance Use Topics   Alcohol use: No    Alcohol/week: 0.0 standard drinks of alcohol   Drug use: No    Ms.Bowell reports that she quit smoking about 8 years ago. Her smoking use included cigarettes. She started smoking about 68 years ago. She has a 60 pack-year smoking history. She has never been exposed to tobacco smoke. She has never used smokeless tobacco. She reports that she does not drink alcohol and does not use drugs.  Tobacco Cessation: Counseling given: Not Answered   Past surgical hx, Family hx, Social hx all reviewed.  Current Outpatient Medications on File Prior to Visit  Medication Sig   acetaminophen (TYLENOL) 325 MG tablet Take 2 tablets (650 mg total) by mouth every 6 (six) hours as needed for mild pain (or Fever >/= 101).   acidophilus (RISAQUAD) CAPS capsule Take 1 capsule by mouth daily.   albuterol (VENTOLIN HFA) 108 (90 Base) MCG/ACT inhaler Inhale 2 puffs into the lungs every 6 (six) hours as needed for wheezing.   apixaban (ELIQUIS) 2.5 MG TABS tablet Take 1 tablet (2.5 mg total) by mouth 2 (two) times daily.   azithromycin (ZITHROMAX Z-PAK) 250 MG tablet take 2 tablets by mouth on day 1 and then 1 tablet daily for 4 days   BREZTRI AEROSPHERE 160-9-4.8 MCG/ACT AERO INHALE 2 PUFFS INTO THE LUNGS TWICE A DAY   buPROPion (WELLBUTRIN XL) 150 MG 24 hr tablet TAKE 1 TABLET BY MOUTH EVERY DAY   Cyanocobalamin (B-12) 2500 MCG TABS Take 2,500 mcg by mouth daily.   DULoxetine (CYMBALTA) 30 MG capsule Take 3 capsules (90 mg total) by mouth daily.   feeding supplement (ENSURE ENLIVE / ENSURE PLUS) LIQD Take 237 mLs by mouth 2 (two) times daily between meals.   fentaNYL  (DURAGESIC) 25 MCG/HR Place 1 patch onto the skin every 3 (three) days.   guaiFENesin 200 MG tablet Take 1 tablet (200 mg total) by mouth every 4 (four) hours as needed for cough or to loosen phlegm.   HYDROcodone-acetaminophen (NORCO) 5-325 MG tablet Take 1 tablet by mouth every 6 (six) hours as needed for moderate pain.   neomycin-polymyxin b-dexamethasone (MAXITROL) 3.5-10000-0.1 SUSP Place  1 drop into the right eye 4 (four) times daily.   omeprazole (PRILOSEC) 40 MG capsule TAKE 1 CAPSULE BY MOUTH EVERY DAY BEFORE BREAKFAST   ondansetron (ZOFRAN-ODT) 4 MG disintegrating tablet Take 1 tablet (4 mg total) by mouth every 8 (eight) hours as needed for nausea or vomiting.   pregabalin (LYRICA) 75 MG capsule Take 1 capsule (75 mg total) by mouth 2 (two) times daily.   valACYclovir (VALTREX) 500 MG tablet TAKE 1 TABLET BY MOUTH TWICE A DAY   No current facility-administered medications on file prior to visit.     Allergies  Allergen Reactions   Penicillins Other (See Comments)    Unknown; childhood allergy - details unknown Tolerates Ancef   Remeron [Mirtazapine] Other (See Comments)    nightmares   Latex Rash    Review Of Systems:  Constitutional:   No  weight loss, night sweats,  Fevers, chills, fatigue, or  lassitude.  HEENT:   No headaches,  Difficulty swallowing,  Tooth/dental problems, or  Sore throat,                No sneezing, itching, ear ache, nasal congestion, post nasal drip,   CV:  No chest pain,  Orthopnea, PND, swelling in lower extremities, anasarca, dizziness, palpitations, syncope.   GI  No heartburn, indigestion, abdominal pain, nausea, vomiting, diarrhea, change in bowel habits, loss of appetite, bloody stools.   Resp: + shortness of breath with exertion less at rest.  + excess mucus, + productive cough,  No non-productive cough,  No coughing up of blood.  No change in color of mucus.  + wheezing.  No chest wall deformity  Skin: no rash or lesions.  GU: no  dysuria, change in color of urine, no urgency or frequency.  No flank pain, no hematuria   MS:  No joint pain or swelling.  No decreased range of motion.  No back pain.  Psych:  No change in mood or affect. No depression or anxiety.  No memory loss.   Vital Signs BP (!) 148/80 (BP Location: Left Arm, Cuff Size: Normal)   Pulse 96   Ht 5\' 8"  (1.727 m)   Wt 135 lb 9.6 oz (61.5 kg)   SpO2 95%   BMI 20.62 kg/m    Physical Exam:  General- No distress,  A&Ox3, pleasant ENT: No sinus tenderness, TM clear, pale nasal mucosa, no oral exudate,no post nasal drip, no LAN Cardiac: S1, S2, regular rate and rhythm, no murmur Chest: No wheeze/ rales/ dullness; no accessory muscle use, no nasal flaring, no sternal retractions, rhonchi, diminished per bases Abd.: Soft Non-tender, ND, BS +, Body mass index is 20.62 kg/m.  Ext: No clubbing cyanosis, edema Neuro:  normal strength, MAE x 4, A&O x 3, physical deconditioning Skin: No rashes, warm and dry, no lesions  Psych: normal mood and behavior   Assessment/Plan Pulmonary Lung Nodules History of Lung cancer  History of esophageal cancer  COPD Former smoker Plan Your Ct chest shows stable lung nodules.  We will do a 12 month follow up CT Chest to reassess these. I have placed the order , it will be due 09/2024. You will get a call to get this scheduled.  We will  scheduled you for an [employer] walk to see if you qualify for the portable oxygen. If you qualify , we will place an order.  Continue Breztri 2 puffs , twice daily , rinse mouth after use. Remember to use Albuterol inhaler as needed for  breakthrough shortness of breath or wheezing. Do not use more than 3-4 times a day.  You do have refills. We will send you home with a flutter valve to help with your secretions. Continue Mucinex daily with a full glass of water as you have been doing. Follow up in 12 months or sooner as needed  Please contact office for sooner follow up if  symptoms do not improve or worsen or seek emergency care      I spent 30 minutes dedicated to the care of this patient on the date of this encounter to include pre-visit review of records, face-to-face time with the patient discussing conditions above, post visit ordering of testing, clinical documentation with the electronic health record, making appropriate referrals as documented, and communicating necessary information to the patient's healthcare team.    Bevelyn Ngo, NP 10/03/2023  10:31 AM

## 2023-10-04 ENCOUNTER — Encounter: Payer: Self-pay | Admitting: Nurse Practitioner

## 2023-10-04 ENCOUNTER — Ambulatory Visit (INDEPENDENT_AMBULATORY_CARE_PROVIDER_SITE_OTHER): Payer: Medicare Other | Admitting: Nurse Practitioner

## 2023-10-04 VITALS — BP 124/70 | HR 81 | Ht 68.0 in | Wt 135.2 lb

## 2023-10-04 DIAGNOSIS — C3490 Malignant neoplasm of unspecified part of unspecified bronchus or lung: Secondary | ICD-10-CM

## 2023-10-04 DIAGNOSIS — R0602 Shortness of breath: Secondary | ICD-10-CM

## 2023-10-04 DIAGNOSIS — J9611 Chronic respiratory failure with hypoxia: Secondary | ICD-10-CM

## 2023-10-04 DIAGNOSIS — J432 Centrilobular emphysema: Secondary | ICD-10-CM | POA: Diagnosis not present

## 2023-10-04 NOTE — Patient Instructions (Addendum)
Continue Albuterol inhaler 2 puffs or 3 mL neb every 6 hours as needed for shortness of breath or wheezing. Notify if symptoms persist despite rescue inhaler/neb use.  Continue Breztri 2 puffs Twice daily. Brush tongue and rinse mouth afterwards Continue guaifenesin as needed for cough/congestion Continue supplemental oxygen 2 lpm at night and 3 lpm with activity on POC. I have sent orders to change you to [employer] for your medical supply company Flutter valve as needed to help loosen phlegm  CT chest in 12 months, as previously ordered   Follow up as previously scheduled with Dr. Tonia Brooms. If symptoms do not improve or worsen, please contact office for sooner follow up or seek emergency care.

## 2023-10-04 NOTE — Assessment & Plan Note (Signed)
Walk test today with desaturation to 84% on room air; required 3 lpm POC to maintain saturations >88-90%. Will send orders to change her DME over to [employer] for new home concentrator 2 lpm continuous and POC unit 3 lpm pulsed. Goal >88-90%. Understands to utilize continuous flow on home concentrator every night, not POC unit.   Patient Instructions  Continue Albuterol inhaler 2 puffs or 3 mL neb every 6 hours as needed for shortness of breath or wheezing. Notify if symptoms persist despite rescue inhaler/neb use.  Continue Breztri 2 puffs Twice daily. Brush tongue and rinse mouth afterwards Continue guaifenesin as needed for cough/congestion Continue supplemental oxygen 2 lpm at night and 3 lpm with activity on POC. I have sent orders to change you to [employer] for your medical supply company Flutter valve as needed to help loosen phlegm  CT chest in 12 months, as previously ordered   Follow up as previously scheduled with Dr. Tonia Brooms. If symptoms do not improve or worsen, please contact office for sooner follow up or seek emergency care.

## 2023-10-04 NOTE — Assessment & Plan Note (Signed)
Follow-up with oncology as scheduled. °

## 2023-10-04 NOTE — Progress Notes (Signed)
@Patient  ID: Cindy Byrd, female    DOB: 1941-09-25, 82 y.o.   MRN: [id number]  Chief Complaint  Patient presents with   Follow-up    Qualifying walk for POC.    Referring provider: Melida Quitter, PA  HPI: 82 year old female, former smoker followed for COPD, lung nodules, chronic respiratory failure, bronchiectasis.  She is a patient of Dr. Myrlene Broker and last seen in office 10/03/2023.  She has a history of esophageal cancer as well as lung cancer treated in 2017 and followed by the cancer center.  Past medical history significant for A-fib on Eliquis, hypertension, Barrett's esophagus, depression, anxiety, protein calorie malnutrition.  TEST/EVENTS:  03/14/2019 echo: EF 60 to 65%.  Impaired relaxation.  RV size and function normal. 06/15/2023 PET restage: No hypermetabolic mediastinal hila nodes.  Stable advanced vascular disease.  Stable mixed lytic and sclerotic bone lesion involving the left iliac bone.  No hypermetabolism to suggest active disease. 09/19/2023 CT chest without contrast: Atherosclerosis.  Enlarged pulmonic trunk.  Centrilobular emphysema.  Scattered pulmonary parenchymal scarring.  Few scattered pulmonary nodules measuring 5 mm or less in size.  Probable small cyst in the right hepatic lobe.  Liver margin slightly irregular.  Elevated left hemidiaphragm.  Old rib fractures.  10/03/2023: [name] with [name], NP.  Had recent surveillance CT for abnormal lung nodules.  Bronchoscopy 06/2023 was aborted as it appeared the nodule of concern was a mucous plug.  Follow-up CT chest 09/2023 shows stable nodules no greater than 5 mm.  Lan for follow-up scan in 12 months.  Using [name] daily for COPD.  Would like to be assessed for [employer] portable oxygen tank.  Like to be more mobile.  Plan to schedule her an appointment for this.  Provided with flutter valve to help with chest congestion.  Advised take Mucinex daily.  10/04/2023: Today-oxygen qualification Patient presents today for  walk test as she would like to receive a portable oxygen concentrator.  She has been on oxygen for about 6 months now.  Only using it when she is at home because she does not like taking the tanks that with her.  She does not always use it when she is up moving around.  Sleeps with it some nights.  Feels unchanged compared to yesterday.  She did start using the flutter valve which she does feel like is already started helping her.  Denies any increased chest congestion, wheezing, lower extremity swelling, orthopnea.  Allergies  Allergen Reactions   Penicillins Other (See Comments)    Unknown; childhood allergy - details unknown Tolerates Ancef   Remeron [Mirtazapine] Other (See Comments)    nightmares   Latex Rash    Immunization History  Administered Date(s) Administered   Fluad Quad(high Dose 65+) 09/04/2019, 09/24/2020   PFIZER(Purple Top)SARS-COV-2 Vaccination 07/09/2020, 10/06/2020   PPD Test 09/13/2016   Zoster Recombinant(Shingrix) 01/23/2023    Past Medical History:  Diagnosis Date   Barrett's esophagus    Bilateral pulmonary embolism (HCC) 09/02/2016   09/02/16 bilateral pulmonary emboli in context of extensive bilateral lower extremity deep venous thromboses Assumed hypercoagulability due to non-small cell metastatic lung cancer Lifelong anticoagulation recommended   Bone neoplasm 06/24/2015   Cancer (HCC)    metastatic poorly differentiated carcinoma. tumor left groin surgical removal with radiation tx.   Cataract    BILATERAL   Cigarette smoker two packs a day or less    Currently still smoking 2 PPD - Not interested in quitting at this time.   Colitis  2017   Colon polyps    hyperplastic, tubular adenomas, tubulovillous adenoma   Cough, persistent    hx. lung cancer ? primary-being evaluated, unsure of primary site.   Depression 06/24/2015   Diverticulosis    DVT of lower extremity, bilateral (HCC) 09/02/2016   Dysrhythmia    PAF in setting of submassive PE 09/2016    Emphysema of lung (HCC)    Endometriosis    Hysterectomy with BSO at age 57 yrs   Esophageal adenocarcinoma (HCC) 08/11/2015   intramucosal   Gastritis    GERD (gastroesophageal reflux disease)    H/O: pneumonia    Hiatal hernia    Hyperglycemia    A1c 7.4% 06/20/22   Hyperlipidemia    Hypertension 06/24/2015   likely improved incidental to 40 lbs weight loss from her neoplasm. No Longer taking med for this as of 08-06-15   IBS (irritable bowel syndrome)    Lung cancer (HCC)    suspected IV lung cancer with left ileum mets 2017, s/p left ileum radiaiton, immunotherapy   Pain    left hip-persistent"tumor of bone"-radiation tx. 10.   Pneumonia    Vitamin D deficiency disease     Tobacco History: Social History   Tobacco Use  Smoking Status Former   Current packs/day: 0.00   Average packs/day: 1 pack/day for 60.0 years (60.0 ttl pk-yrs)   Types: Cigarettes   Start date: 12/05/1954   Quit date: 12/05/2014   Years since quitting: 8.8   Passive exposure: Never  Smokeless Tobacco Never   Counseling given: Not Answered   Outpatient Medications Prior to Visit  Medication Sig Dispense Refill   acetaminophen (TYLENOL) 325 MG tablet Take 2 tablets (650 mg total) by mouth every 6 (six) hours as needed for mild pain (or Fever >/= 101). 20 tablet 0   acidophilus (RISAQUAD) CAPS capsule Take 1 capsule by mouth daily.     albuterol (VENTOLIN HFA) 108 (90 Base) MCG/ACT inhaler Inhale 2 puffs into the lungs every 6 (six) hours as needed for wheezing. 2 each 11   apixaban (ELIQUIS) 2.5 MG TABS tablet Take 1 tablet (2.5 mg total) by mouth 2 (two) times daily. 60 tablet 2   azithromycin (ZITHROMAX Z-PAK) 250 MG tablet take 2 tablets by mouth on day 1 and then 1 tablet daily for 4 days 6 each 0   BREZTRI AEROSPHERE 160-9-4.8 MCG/ACT AERO INHALE 2 PUFFS INTO THE LUNGS TWICE A DAY 10.7 each 1   buPROPion (WELLBUTRIN XL) 150 MG 24 hr tablet TAKE 1 TABLET BY MOUTH EVERY DAY 30 tablet 0    Cyanocobalamin (B-12) 2500 MCG TABS Take 2,500 mcg by mouth daily.     DULoxetine (CYMBALTA) 30 MG capsule Take 3 capsules (90 mg total) by mouth daily. 120 capsule 3   feeding supplement (ENSURE ENLIVE / ENSURE PLUS) LIQD Take 237 mLs by mouth 2 (two) times daily between meals. 237 mL 12   fentaNYL (DURAGESIC) 25 MCG/HR Place 1 patch onto the skin every 3 (three) days. 10 patch 0   guaiFENesin 200 MG tablet Take 1 tablet (200 mg total) by mouth every 4 (four) hours as needed for cough or to loosen phlegm. 30 suppository 0   HYDROcodone-acetaminophen (NORCO) 5-325 MG tablet Take 1 tablet by mouth every 6 (six) hours as needed for moderate pain. 30 tablet 0   neomycin-polymyxin b-dexamethasone (MAXITROL) 3.5-10000-0.1 SUSP Place 1 drop into the right eye 4 (four) times daily.     omeprazole (PRILOSEC) 40 MG capsule  TAKE 1 CAPSULE BY MOUTH EVERY DAY BEFORE BREAKFAST 90 capsule 1   ondansetron (ZOFRAN-ODT) 4 MG disintegrating tablet Take 1 tablet (4 mg total) by mouth every 8 (eight) hours as needed for nausea or vomiting. 20 tablet 0   pregabalin (LYRICA) 100 MG capsule Take 1 capsule (100 mg total) by mouth 2 (two) times daily. 60 capsule 1   valACYclovir (VALTREX) 500 MG tablet TAKE 1 TABLET BY MOUTH TWICE A DAY 180 tablet 3   No facility-administered medications prior to visit.     Review of Systems:   Constitutional: No weight loss or gain, night sweats, fevers, chills, or lassitude. +fatigue (Baseline) HEENT: No headaches, difficulty swallowing, tooth/dental problems, or sore throat. No sneezing, itching, ear ache, nasal congestion, or post nasal drip. +blind right eye CV:  No chest pain, orthopnea, PND, swelling in lower extremities, anasarca, dizziness, palpitations, syncope Resp: +baseline shortness of breath with exertion, daily cough. No excess mucus or change in color of mucus. No hemoptysis. No wheezing.  No chest wall deformity GI:  No heartburn, indigestion Skin: No rash, lesions,  ulcerations MSK:  No joint pain or swelling.   Neuro: No dizziness or lightheadedness.  Psych: No depression or anxiety. Mood stable.     Physical Exam:  BP 124/70 (BP Location: Right Arm, Cuff Size: Normal)   Pulse 81   Ht 5\' 8"  (1.727 m)   Wt 135 lb 3.2 oz (61.3 kg)   SpO2 96%   BMI 20.56 kg/m   GEN: Pleasant, interactive, well-kempt; in no acute distress HEENT:  Normocephalic and atraumatic. PERRLA left. Sclera white. Nasal turbinates pink, moist and patent bilaterally. No rhinorrhea present. Oropharynx pink and moist, without exudate or edema. No lesions, ulcerations, or postnasal drip.  NECK:  Supple w/ fair ROM. No JVD present. Thyroid symmetrical with no goiter or nodules palpated. No lymphadenopathy.   CV: RRR, no m/r/g, no peripheral edema. Pulses intact, +2 bilaterally. No cyanosis, pallor or clubbing. PULMONARY:  Unlabored, regular breathing. Diminished bilaterally A&P w/o wheezes/rales/rhonchi. No accessory muscle use.  GI: BS present and normoactive. Soft, non-tender to palpation. No organomegaly or masses detected.  MSK: No erythema, warmth or tenderness. Cap refil <2 sec all extrem.  Neuro: A/Ox3. No focal deficits noted.   Skin: Warm, no lesions or rashe Psych: Normal affect and behavior. Judgement and thought content appropriate.     Lab Results:  CBC    Component Value Date/Time   WBC 8.9 10/03/2023 1358   WBC 10.4 06/06/2023 1205   RBC 3.81 (L) 10/03/2023 1358   HGB 12.4 10/03/2023 1358   HGB 15.0 06/20/2022 1036   HGB 11.5 (L) 11/16/2017 1422   HCT 37.3 10/03/2023 1358   HCT 44.6 06/20/2022 1036   HCT 37.1 11/16/2017 1422   PLT 286 10/03/2023 1358   PLT 354 06/20/2022 1036   MCV 97.9 10/03/2023 1358   MCV 90 06/20/2022 1036   MCV 90.9 11/16/2017 1422   MCH 32.5 10/03/2023 1358   MCHC 33.2 10/03/2023 1358   RDW 13.2 10/03/2023 1358   RDW 14.5 06/20/2022 1036   RDW 14.1 11/16/2017 1422   LYMPHSABS 2.7 10/03/2023 1358   LYMPHSABS 2.7 06/20/2022  1036   LYMPHSABS 1.6 11/16/2017 1422   MONOABS 0.8 10/03/2023 1358   MONOABS 0.3 11/16/2017 1422   EOSABS 0.2 10/03/2023 1358   EOSABS 0.2 06/20/2022 1036   BASOSABS 0.1 10/03/2023 1358   BASOSABS 0.1 06/20/2022 1036   BASOSABS 0.0 11/16/2017 1422    BMET  Component Value Date/Time   NA 141 10/03/2023 1358   NA 140 06/20/2022 1036   NA 141 11/16/2017 1422   K 4.0 10/03/2023 1358   K 3.6 11/16/2017 1422   CL 110 10/03/2023 1358   CO2 26 10/03/2023 1358   CO2 28 11/16/2017 1422   GLUCOSE 190 (H) 10/03/2023 1358   GLUCOSE 149 (H) 11/16/2017 1422   BUN 17 10/03/2023 1358   BUN 27 06/20/2022 1036   BUN 12.6 11/16/2017 1422   CREATININE 0.97 10/03/2023 1358   CREATININE 0.9 11/16/2017 1422   CALCIUM 9.7 10/03/2023 1358   CALCIUM 8.9 11/16/2017 1422   GFRNONAA 58 (L) 10/03/2023 1358   GFRAA 55 (L) 08/21/2020 0855    BNP    Component Value Date/Time   BNP 46.6 02/25/2019 1400     Imaging:  CT CHEST WO CONTRAST  Result Date: 09/28/2023 CLINICAL DATA:  Metastatic esophageal cancer treated with immunotherapy, chemotherapy and radiation therapy. Cough and shortness of breath. * Tracking Code: BO * EXAM: CT CHEST WITHOUT CONTRAST TECHNIQUE: Multidetector CT imaging of the chest was performed following the standard protocol without IV contrast. RADIATION DOSE REDUCTION: This exam was performed according to the departmental dose-optimization program which includes automated exposure control, adjustment of the mA and/or kV according to patient size and/or use of iterative reconstruction technique. COMPARISON:  05/04/2023. FINDINGS: Cardiovascular: Right IJ Port-A-Cath terminates in the SVC. Atherosclerotic calcification of the aorta and coronary arteries. Enlarged pulmonic trunk. Heart size normal. No pericardial effusion. Mediastinum/Nodes: No pathologically enlarged mediastinal or axillary lymph nodes. Hilar regions are difficult to definitively evaluate without IV contrast.  Esophagus is grossly unremarkable. Lungs/Pleura: Centrilobular emphysema. Scattered pulmonary parenchymal scarring. A few scattered pulmonary nodules measure 5 mm or less in size, unchanged. These can be reassessed on routine follow-up imaging. No pleural fluid. Debris in the airway. Upper Abdomen: Liver margin is slightly irregular. Probable small cysts in the right hepatic lobe. Disorder splenic contour, as before. Elevated left hemidiaphragm. Visualized portions of the liver, gallbladder, adrenal glands, kidneys, spleen, pancreas, stomach and bowel are otherwise grossly unremarkable. No upper abdominal adenopathy. Musculoskeletal: Old rib fractures. Degenerative changes in the spine. No worrisome lytic or sclerotic lesions. IMPRESSION: 1. No evidence of recurrent or metastatic disease. 2. Liver margin is slightly irregular, indicative of cirrhosis. 3. Aortic atherosclerosis (ICD10-I70.0). Coronary artery calcification. 4. Enlarged pulmonic trunk, indicative of pulmonary arterial hypertension. 5.  Emphysema (ICD10-J43.9). Electronically Signed   By: Leanna Battles M.D.   On: 09/28/2023 15:25    denosumab (XGEVA) injection 120 mg     Date Action Dose Route User   09/11/2023 1519 Given 120 mg Subcutaneous (Left Arm) Loreta Ave, Alissa H, LPN           No data to display          No results found for: "NITRICOXIDE"      Assessment & Plan:   Chronic respiratory failure with hypoxia (HCC) Walk test today with desaturation to 84% on room air; required 3 lpm POC to maintain saturations >88-90%. Will send orders to change her DME over to [employer] for new home concentrator 2 lpm continuous and POC unit 3 lpm pulsed. Goal >88-90%. Understands to utilize continuous flow on home concentrator every night, not POC unit.   Patient Instructions  Continue Albuterol inhaler 2 puffs or 3 mL neb every 6 hours as needed for shortness of breath or wheezing. Notify if symptoms persist despite rescue inhaler/neb  use.  Continue Breztri 2 puffs Twice  daily. Brush tongue and rinse mouth afterwards Continue guaifenesin as needed for cough/congestion Continue supplemental oxygen 2 lpm at night and 3 lpm with activity on POC. I have sent orders to change you to [employer] for your medical supply company Flutter valve as needed to help loosen phlegm  CT chest in 12 months, as previously ordered   Follow up as previously scheduled with Dr. Tonia Brooms. If symptoms do not improve or worsen, please contact office for sooner follow up or seek emergency care.    Centrilobular emphysema (HCC) Compensated on current regimen. Action plan in place  Malignant neoplasm of lung (HCC) Follow up with oncology as scheduled    I spent 25 minutes of dedicated to the care of this patient on the date of this encounter to include pre-visit review of records, face-to-face time with the patient discussing conditions above, post visit ordering of testing, clinical documentation with the electronic health record, making appropriate referrals as documented, and communicating necessary findings to members of the patients care team.  Noemi Chapel, NP 10/04/2023  Pt aware and understands NP's role.

## 2023-10-04 NOTE — Assessment & Plan Note (Signed)
Compensated on current regimen. Action plan in place.  

## 2023-10-09 ENCOUNTER — Encounter: Payer: Self-pay | Admitting: Hematology

## 2023-10-09 NOTE — Progress Notes (Signed)
HEMATOLOGY ONCOLOGY CLINIC VISIT NOTE  Date of service:  10/09/23    Patient Care Team: Melida Quitter, PA as PCP - General (Family Medicine) Johney Maine, MD as Consulting Physician (Hematology and Oncology)  CHIEF COMPLAINTS/PURPOSE OF CONSULTATION:  Follow-up for continued evaluation and management of metastatic lung cancer Follow-up for esophageal cancer  DIAGNOSIS:   #1 Metastatic non-small cell lung cancer with bilateral lung nodules and large metastatic lesion in the left Ilium. 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 Also had c diff colitis - treated   Current Treatment  1) Active surveillance 2) Xgeva 120mg  [place] q12weeks for bone metastases. 3) Sandostatin q4weeks for diarrhea   Previous Treatment  For metastatic lung cancer 1 Palliative radiation therapy to the large left ilium metastases 2. IV Nivolumab x 20 cycles (discontinued due to likely immune colitis) 3. Xgeva 120mg  [place] q4weeks for bone metastases.  For Esophageal adenocarcinoma S/p Concurrent carbo/taxol + RT  HISTORY OF PRESENTING ILLNESS: (plz see my previous consultation for details of initial presentation)  INTERVAL HISTORY:  Patient is here for continued evaluation and management of patient's hypercalcemia and to review her CT chest and whole-body bone scan results. Patient's labs done today were reviewed with her in detail and showed resolution of her hypercalcemia after she went back on her Rivka Shannah. CT chest without contrast shows no evidence of recurrent or metastatic disease. Whole-body bone scan has been done but read is currently pending. Patient notes some improvement in her postherpetic neuralgia on Lyrica and she is okay with [place] increasing the dose on this.   MEDICAL HISTORY:  Past Medical History:  Diagnosis Date   Barrett's esophagus    Bilateral pulmonary embolism (HCC) 09/02/2016   09/02/16 bilateral  pulmonary emboli in context of extensive bilateral lower extremity deep venous thromboses Assumed hypercoagulability due to non-small cell metastatic lung cancer Lifelong anticoagulation recommended   Bone neoplasm 06/24/2015   Cancer (HCC)    metastatic poorly differentiated carcinoma. tumor left groin surgical removal with radiation tx.   Cataract    BILATERAL   Cigarette smoker two packs a day or less    Currently still smoking 2 PPD - Not interested in quitting at this time.   Colitis 2017   Colon polyps    hyperplastic, tubular adenomas, tubulovillous adenoma   Cough, persistent    hx. lung cancer ? primary-being evaluated, unsure of primary site.   Depression 06/24/2015   Diverticulosis    DVT of lower extremity, bilateral (HCC) 09/02/2016   Dysrhythmia    PAF in setting of submassive PE 09/2016   Emphysema of lung (HCC)    Endometriosis    Hysterectomy with BSO at age 35 yrs   Esophageal adenocarcinoma (HCC) 08/11/2015   intramucosal   Gastritis    GERD (gastroesophageal reflux disease)    H/O: pneumonia    Hiatal hernia    Hyperglycemia    A1c 7.4% 06/20/22   Hyperlipidemia    Hypertension 06/24/2015   likely improved incidental to 40 lbs weight loss from her neoplasm. No Longer taking med for this as of 08-06-15   IBS (irritable bowel syndrome)    Lung cancer (HCC)    suspected IV lung cancer with left ileum mets 2017, s/p left ileum radiaiton, immunotherapy   Pain    left hip-persistent"tumor of bone"-radiation tx. 10.   Pneumonia    Vitamin D deficiency disease    SURGICAL HISTORY: Past Surgical  History:  Procedure Laterality Date   ABDOMINAL HYSTERECTOMY     BALLOON DILATION N/A 10/08/2019   Procedure: BALLOON DILATION;  Surgeon: Beverley Fiedler, MD;  Location: [facility];  Service: Gastroenterology;  Laterality: N/A;   BARTHOLIN GLAND CYST EXCISION  82 yo ago   Does not want if it was an infected cyst or tumor. Was soon as delivery   BIOPSY  01/02/2019    Procedure: BIOPSY;  Surgeon: Beverley Fiedler, MD;  Location: [facility];  Service: Gastroenterology;;   CATARACT EXTRACTION     COLONOSCOPY W/ POLYPECTOMY     multiple times - last done 09/2014 per patient.   ESOPHAGOGASTRODUODENOSCOPY (EGD) WITH PROPOFOL N/A 08/11/2015   Procedure: ESOPHAGOGASTRODUODENOSCOPY (EGD) WITH PROPOFOL;  Surgeon: Beverley Fiedler, MD;  Location: [facility];  Service: Gastroenterology;  Laterality: N/A;   ESOPHAGOGASTRODUODENOSCOPY (EGD) WITH PROPOFOL N/A 01/02/2019   Procedure: ESOPHAGOGASTRODUODENOSCOPY (EGD) WITH PROPOFOL;  Surgeon: Beverley Fiedler, MD;  Location: [facility];  Service: Gastroenterology;  Laterality: N/A;   ESOPHAGOGASTRODUODENOSCOPY (EGD) WITH PROPOFOL N/A 10/08/2019   Procedure: ESOPHAGOGASTRODUODENOSCOPY (EGD) WITH PROPOFOL;  Surgeon: Beverley Fiedler, MD;  Location: [facility];  Service: Gastroenterology;  Laterality: N/A;   EYE SURGERY Right 2024   Multiple surgeries over the past year.   FLEXIBLE SIGMOIDOSCOPY N/A 06/24/2017   Procedure: FLEXIBLE SIGMOIDOSCOPY;  Surgeon: Ruffin Frederick, MD;  Location: [facility];  Service: Gastroenterology;  Laterality: N/A;   GANGLION CYST EXCISION     KNEE ARTHROSCOPY  age about 58 yrs   TONSILLECTOMY     TOTAL ABDOMINAL HYSTERECTOMY W/ BILATERAL SALPINGOOPHORECTOMY  at age 56 yrs   For endometriosis   VIDEO BRONCHOSCOPY WITH ENDOBRONCHIAL ULTRASOUND Bilateral 06/06/2023   Procedure: VIDEO BRONCHOSCOPY WITH ENDOBRONCHIAL ULTRASOUND;  Surgeon: Josephine Igo, DO;  Location: [facility];  Service: Cardiopulmonary;  Laterality: Bilateral;    SOCIAL HISTORY: Social History   Socioeconomic History   Marital status: Widowed    Spouse name: Not on file   Number of children: 2   Years of education: Not on file   Highest education level: Not on file  Occupational History   Not on file  Tobacco Use   Smoking status: Former    Current packs/day: 0.00    Average packs/day: 1 pack/day for 60.0  years (60.0 ttl pk-yrs)    Types: Cigarettes    Start date: [birth year]/05/1954    Quit date: 12/05/2014    Years since quitting: 8.8    Passive exposure: Never   Smokeless tobacco: Never  Vaping Use   Vaping status: Never Used  Substance and Sexual Activity   Alcohol use: No    Alcohol/week: 0.0 standard drinks of alcohol   Drug use: No   Sexual activity: Not Currently  Other Topics Concern   Not on file  Social History Narrative   Not on file   Social Determinants of Health   Financial Resource Strain: Low Risk  (03/28/2023)   Overall Financial Resource Strain (CARDIA)    Difficulty of Paying Living Expenses: Not hard at all  Food Insecurity: No Food Insecurity (03/28/2023)   Hunger Vital Sign    Worried About Running Out of Food in the Last Year: Never true    Ran Out of Food in the Last Year: Never true  Transportation Needs: No Transportation Needs (03/28/2023)   PRAPARE - Administrator, Civil Service (Medical): No    Lack of Transportation (Non-Medical): No  Physical Activity: Inactive (03/28/2023)   Exercise  Vital Sign    Days of Exercise per Week: 0 days    Minutes of Exercise per Session: 0 min  Stress: No Stress Concern Present (03/28/2023)   [employer] of Occupational Health - Occupational Stress Questionnaire    Feeling of Stress : Not at all  Social Connections: Unknown (03/28/2023)   Social Connection and Isolation Panel [NHANES]    Frequency of Communication with Friends and Family: Patient declined    Frequency of Social Gatherings with Friends and Family: Patient declined    Attends Religious Services: Patient declined    Database administrator or Organizations: Patient declined    Attends Banker Meetings: Patient declined    Marital Status: Widowed  Intimate Partner Violence: Not At Risk (03/28/2023)   Humiliation, Afraid, Rape, and Kick questionnaire    Fear of Current or Ex-Partner: No    Emotionally Abused: No    Physically Abused:  No    Sexually Abused: No    FAMILY HISTORY: Family History  Problem Relation Age of Onset   Colon cancer Brother    Colon cancer Brother    Stroke Mother    Colon cancer Father    Emphysema Father        smoked   Breast cancer Daughter 46       ER/PR+ stage II    ALLERGIES:  is allergic to penicillins, remeron [mirtazapine], and latex. patient wonders if she has a penicillin allergy but notes that she is uncertain about this.  MEDICATIONS:  Current Outpatient Medications  Medication Sig Dispense Refill   acetaminophen (TYLENOL) 325 MG tablet Take 2 tablets (650 mg total) by mouth every 6 (six) hours as needed for mild pain (or Fever >/= 101). 20 tablet 0   acidophilus (RISAQUAD) CAPS capsule Take 1 capsule by mouth daily.     albuterol (VENTOLIN HFA) 108 (90 Base) MCG/ACT inhaler Inhale 2 puffs into the lungs every 6 (six) hours as needed for wheezing. 2 each 11   apixaban (ELIQUIS) 2.5 MG TABS tablet Take 1 tablet (2.5 mg total) by mouth 2 (two) times daily. 60 tablet 2   azithromycin (ZITHROMAX Z-PAK) 250 MG tablet take 2 tablets by mouth on day 1 and then 1 tablet daily for 4 days 6 each 0   BREZTRI AEROSPHERE 160-9-4.8 MCG/ACT AERO INHALE 2 PUFFS INTO THE LUNGS TWICE A DAY 10.7 each 1   buPROPion (WELLBUTRIN XL) 150 MG 24 hr tablet TAKE 1 TABLET BY MOUTH EVERY DAY 30 tablet 0   Cyanocobalamin (B-12) 2500 MCG TABS Take 2,500 mcg by mouth daily.     DULoxetine (CYMBALTA) 30 MG capsule Take 3 capsules (90 mg total) by mouth daily. 120 capsule 3   feeding supplement (ENSURE ENLIVE / ENSURE PLUS) LIQD Take 237 mLs by mouth 2 (two) times daily between meals. 237 mL 12   fentaNYL (DURAGESIC) 25 MCG/HR Place 1 patch onto the skin every 3 (three) days. 10 patch 0   guaiFENesin 200 MG tablet Take 1 tablet (200 mg total) by mouth every 4 (four) hours as needed for cough or to loosen phlegm. 30 suppository 0   HYDROcodone-acetaminophen (NORCO) 5-325 MG tablet Take 1 tablet by mouth every 6  (six) hours as needed for moderate pain. 30 tablet 0   neomycin-polymyxin b-dexamethasone (MAXITROL) 3.5-10000-0.1 SUSP Place 1 drop into the right eye 4 (four) times daily.     omeprazole (PRILOSEC) 40 MG capsule TAKE 1 CAPSULE BY MOUTH EVERY DAY BEFORE BREAKFAST 90 capsule  1   ondansetron (ZOFRAN-ODT) 4 MG disintegrating tablet Take 1 tablet (4 mg total) by mouth every 8 (eight) hours as needed for nausea or vomiting. 20 tablet 0   pregabalin (LYRICA) 100 MG capsule Take 1 capsule (100 mg total) by mouth 2 (two) times daily. 60 capsule 1   valACYclovir (VALTREX) 500 MG tablet TAKE 1 TABLET BY MOUTH TWICE A DAY 180 tablet 3   No current facility-administered medications for this visit.   REVIEW OF SYSTEMS:  10 Point review of Systems was done is negative except as noted above.   PHYSICAL EXAMINATION:  .BP 130/74   Pulse 91   Temp 97.7 F (36.5 C)   Resp 16   Wt 136 lb 12.8 oz (62.1 kg)   SpO2 96%   BMI 20.80 kg/m   NAD GENERAL:alert, in no acute distress and comfortable SKIN: no acute rashes, no significant lesions EYES: conjunctiva are pink and non-injected, sclera anicteric OROPHARYNX: MMM, no exudates, no oropharyngeal erythema or ulceration NECK: supple, no JVD LYMPH:  no palpable lymphadenopathy in the cervical, axillary or inguinal regions LUNGS: clear to auscultation b/l with normal respiratory effort HEART: regular rate & rhythm ABDOMEN:  normoactive bowel sounds , non tender, not distended. Extremity: no pedal edema PSYCH: alert & oriented x 3 with fluent speech NEURO: no focal motor/sensory deficits   LABORATORY DATA:  I have reviewed the data as listed  .    Latest Ref Rng & Units 10/03/2023    1:58 PM 09/11/2023    1:32 PM 06/19/2023    1:35 PM  CBC  WBC 4.0 - 10.5 K/uL 8.9  9.3  11.2   Hemoglobin 12.0 - 15.0 g/dL 40.9  81.1  91.4   Hematocrit 36.0 - 46.0 % 37.3  40.1  36.3   Platelets 150 - 400 K/uL 286  274  365    ANC 1.8k .    Latest Ref Rng &  Units 10/03/2023    1:58 PM 09/11/2023    1:32 PM 06/19/2023    1:35 PM  CMP  Glucose 70 - 99 mg/dL 782  956  213   BUN 8 - 23 mg/dL 17  17  14    Creatinine 0.44 - 1.00 mg/dL 0.86  5.78  4.69   Sodium 135 - 145 mmol/L 141  138  139   Potassium 3.5 - 5.1 mmol/L 4.0  4.2  4.6   Chloride 98 - 111 mmol/L 110  103  110   CO2 22 - 32 mmol/L 26  28  21    Calcium 8.9 - 10.3 mg/dL 9.7  62.9  9.5   Total Protein 6.5 - 8.1 g/dL 7.2  7.3  7.1   Total Bilirubin 0.3 - 1.2 mg/dL 0.4  0.4  0.3   Alkaline Phos 38 - 126 U/L 49  50  58   AST 15 - 41 U/L 18  19  16    ALT 0 - 44 U/L 7  7  6          01/02/19 Esophagus Biopsy:    RADIOGRAPHIC STUDIES: .CT CHEST WO CONTRAST  Result Date: 09/28/2023 CLINICAL DATA:  Metastatic esophageal cancer treated with immunotherapy, chemotherapy and radiation therapy. Cough and shortness of breath. * Tracking Code: BO * EXAM: CT CHEST WITHOUT CONTRAST TECHNIQUE: Multidetector CT imaging of the chest was performed following the standard protocol without IV contrast. RADIATION DOSE REDUCTION: This exam was performed according to the departmental dose-optimization program which includes automated exposure control, adjustment of  the mA and/or kV according to patient size and/or use of iterative reconstruction technique. COMPARISON:  05/04/2023. FINDINGS: Cardiovascular: Right IJ Port-A-Cath terminates in the SVC. Atherosclerotic calcification of the aorta and coronary arteries. Enlarged pulmonic trunk. Heart size normal. No pericardial effusion. Mediastinum/Nodes: No pathologically enlarged mediastinal or axillary lymph nodes. Hilar regions are difficult to definitively evaluate without IV contrast. Esophagus is grossly unremarkable. Lungs/Pleura: Centrilobular emphysema. Scattered pulmonary parenchymal scarring. A few scattered pulmonary nodules measure 5 mm or less in size, unchanged. These can be reassessed on routine follow-up imaging. No pleural fluid. Debris in the airway.  Upper Abdomen: Liver margin is slightly irregular. Probable small cysts in the right hepatic lobe. Disorder splenic contour, as before. Elevated left hemidiaphragm. Visualized portions of the liver, gallbladder, adrenal glands, kidneys, spleen, pancreas, stomach and bowel are otherwise grossly unremarkable. No upper abdominal adenopathy. Musculoskeletal: Old rib fractures. Degenerative changes in the spine. No worrisome lytic or sclerotic lesions. IMPRESSION: 1. No evidence of recurrent or metastatic disease. 2. Liver margin is slightly irregular, indicative of cirrhosis. 3. Aortic atherosclerosis (ICD10-I70.0). Coronary artery calcification. 4. Enlarged pulmonic trunk, indicative of pulmonary arterial hypertension. 5.  Emphysema (ICD10-J43.9). Electronically Signed   By: Leanna Battles M.D.   On: 09/28/2023 15:25    ASSESSMENT & PLAN:   82 y.o. female with  #1 Metastatic poorly differentiated carcinoma with likely lung primary non-small cell lung cancer.    CT of the head with and without contrast showed no evidence of metastatic disease. EGFR blood test mutation analysis negative. CT chest abdomen pelvis 04/19/2016 shows no evidence of disease progression. Patient tolerated Nivolumab very well but was discontinued when she developed grade 2 Immune colitis. Has been off Nivolumab for >6 months  CT chest abdomen pelvis on 06/24/2016 shows no evidence of new disease or progression of metastatic disease. CT chest abdomen pelvis 09/06/2016 shows 1. Mixed interval response to therapy. 2. There is a new left ventral chest wall lesion deep to the pectoralis musculature worrisome for metastatic disease. 3. Posterior lower lobe nodular densities are identified which may reflect areas of pulmonary metastasis. 4. Interval decrease in size of destructive lesion involving the left iliac bone.  CT chest abd pelvis 12/08/2016: Cystic mass involving the left ventral chest wall has resolved in the interval. Likely was a  hematoma due to trauma. Interval increase in size of pleural base mass overlying the posterior and inferior left lower lobe. There is also a new left pleural effusion identified.  CT chest 02/01/2017: Residual irregular soft tissue thickening/volume loss and trace left pleural fluid at the base of the left hemithorax, overall improved in appearance from 12/08/2016. No measurable lesion.  CT chest 05/29/2017 shows no residual pleural based mass or significant pleural effusion in the left hemithorax. No evidence of thoracic metastatic disease. No evidence of progressive metastatic disease within the abdomen or pelvis. Mixed lytic and blastic lesion involving the left iliac bone and associated pathologic fracture are unchanged.   CT CAP 09/14/17 shows no new changes. She does have slight displacement of her fractured left iliac bone. Evidence of stable disease.   CT CAP 01/04/2018- No new or progressive metastatic disease. Stable large left iliac bone metastasis with associated chronic pathologic fracture.   CT chest/abd/pelvis done on 04/26/18 revealed Stable exam.  No new or progressive interval findings.  07/19/18 CT C/A/P revealed Stable exam.  No new or progressive interval findings. Large destructive left iliac lesion is similar to prior. Aortic Atherosclerosis and Emphysema.  11/06/18 CT C/A/P revealed Similar appearance of large mixed lytic and sclerotic lesion in the left ilium. No new metastatic lesions are otherwise noted elsewhere in the chest, abdomen or pelvis. 2. Interval development of thickening of the distal third of the esophagus. This is nonspecific, and could be related to underlying reflux esophagitis. However, if there is any clinical concern for Barrett's metaplasia or esophageal neoplasia, further evaluation with nonemergent endoscopy could be considered. 3. Aortic atherosclerosis, in addition to left main coronary artery disease. Assessment for potential risk factor modification,  dietary therapy or pharmacologic therapy may be warranted, if clinically Indicated. 4. Diffuse bronchial wall thickening with mild to moderate centrilobular and paraseptal emphysema; imaging findings suggestive of underlying COPD. 5. Additional incidental findings, as above.  #2  Adenocarcinoma of the Esophagus  Barrett's esophagus 4cms in the distal esophagus with low and high-grade dysplasia  01/02/19 Surgical pathology revealed adenocarcinoma of the esophagus   01/25/19 PET/CT revealed Distal esophageal primary, without hypermetabolic metastatic disease. 2. Chronic left iliac metastasis, as before. 3. Hypermetabolism within and superficial to the right gluteal musculature is most likely related to trauma and/or injection sites. 4. Aortic atherosclerosis, coronary artery atherosclerosis and emphysema.  S/p concurrent Carboplatin and Taxol weekly with RT of 45 Gy in 25 fractions and 5.4 Gy boost, completed between 02/04/19 and 03/27/19  07/03/2019 PET skull base to thigh revealed "1. Interval response to therapy. Significant reduction in FDG uptake associated with distal esophageal mass. SUV max currently 2.61 versus 16.97 previously. 2. 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 Unchanged 3. Aortic Atherosclerosis (ICD10-I70.0) and Emphysema (ICD10-J43.9). Coronary artery calcifications."  07/15/2020 CT C/A/P ([id number]) ([id number]) revealed "1. No evidence of new or progressive metastatic disease in the chest, abdomen or pelvis."  #3 diarrhea-  now resolved was previously. 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 She was previously on on Lialda, budesonide,probiotics and lomotil but not currently taking any of these. Plan -Continue Sandostatin every 4 weeks   #4 h/o DVT and PE  -continue on Xarelto - no issues with bleeding   #5 history of COPD management  per primary care physician  #6 severe shingles of the forehead with postherpetic neuralgia  PLAN: -Discussed lab results from today, 10/03/2023, in detail with the patient.  CBC is stable and CMP shows resolution of the hypercalcemia -Patient recommended to continue to drink at least 64 ounces of water daily -Continue Xgeva every 3 months -Will increase the Lyrica to 100 mg p.o. twice daily. CT chest without contrast shows no evidence of overt progression of the metastatic disease. Whole-body bone scan has been done and is currently pending results -Recommend influenza vaccine, COVID-19 Booster, RSV vaccine, and other age related vaccines.     FOLLOW-UP: continue Xgeva every 12 weeks RTC with Dr Candise Che in 3 months with port flush and labs  The total time spent in the appointment was 25 minutes*.  All of the patient's questions were answered with apparent satisfaction. The patient knows to call the clinic with any problems, questions or concerns.   Wyvonnia Lora MD MS AAHIVMS [name] Hematology/Oncology Physician [facility]  .*Total Encounter Time as defined by the Centers for Medicare and Medicaid Services includes, in addition to the face-to-face time of a patient visit (documented in the note above) non-face-to-face time: obtaining and reviewing outside history, ordering and reviewing medications, tests or  procedures, care coordination (communications with other health care professionals or caregivers) and documentation in the medical record.

## 2023-10-17 ENCOUNTER — Telehealth: Payer: Self-pay | Admitting: Nurse Practitioner

## 2023-10-17 NOTE — Telephone Encounter (Signed)
Patient is trying to return missed call.

## 2023-10-17 NOTE — Telephone Encounter (Signed)
Pt needs a call back from [place], She has questions about [employer] company

## 2023-10-20 NOTE — Telephone Encounter (Signed)
Order sent to [employer]   Awaiting response and will cancel order with [employer] If needed.

## 2023-10-20 NOTE — Telephone Encounter (Signed)
Patient calling about [employer]- has not heard anything, please advise.

## 2023-10-24 ENCOUNTER — Ambulatory Visit (INDEPENDENT_AMBULATORY_CARE_PROVIDER_SITE_OTHER): Payer: Medicare Other | Admitting: Family Medicine

## 2023-10-24 ENCOUNTER — Encounter: Payer: Self-pay | Admitting: Family Medicine

## 2023-10-24 VITALS — BP 122/64 | HR 76 | Resp 18 | Ht 68.0 in | Wt 135.0 lb

## 2023-10-24 DIAGNOSIS — L282 Other prurigo: Secondary | ICD-10-CM

## 2023-10-24 DIAGNOSIS — B0230 Zoster ocular disease, unspecified: Secondary | ICD-10-CM | POA: Diagnosis not present

## 2023-10-24 DIAGNOSIS — J432 Centrilobular emphysema: Secondary | ICD-10-CM

## 2023-10-24 DIAGNOSIS — F411 Generalized anxiety disorder: Secondary | ICD-10-CM

## 2023-10-24 MED ORDER — DULOXETINE HCL 30 MG PO CPEP: 90.0000 mg | ORAL_CAPSULE | Freq: Every day | ORAL | 3 refills | Status: DC

## 2023-10-24 MED ORDER — GABAPENTIN 600 MG PO TABS: 600.0000 mg | ORAL_TABLET | Freq: Three times a day (TID) | ORAL | 1 refills | Status: DC

## 2023-10-24 MED ORDER — CETIRIZINE HCL 10 MG PO TABS: 10.0000 mg | ORAL_TABLET | Freq: Every day | ORAL | 0 refills | Status: DC

## 2023-10-24 MED ORDER — BREZTRI AEROSPHERE 160-9-4.8 MCG/ACT IN AERO: INHALATION_SPRAY | RESPIRATORY_TRACT | 1 refills | Status: DC

## 2023-10-24 MED ORDER — TRIAMCINOLONE ACETONIDE 0.025 % EX CREA: 1.0000 | TOPICAL_CREAM | Freq: Two times a day (BID) | CUTANEOUS | 1 refills | Status: DC

## 2023-10-24 NOTE — Patient Instructions (Signed)
Keep the area moisturized.  Start taking cetirizine nightly at bedtime for the next month.  Use the anti-itch cream called triamcinolone as needed.

## 2023-10-24 NOTE — Progress Notes (Signed)
Acute Office Visit  Subjective:     Patient ID: Cindy Byrd, female    DOB: [birth year], 82 y.o.   MRN: [id number]  Chief Complaint  Patient presents with   Herpes Zoster   Pruritis    Bilateral shoulders    HPI Patient is in today for what she believes is a shingles flare.  She complains of itching bilaterally on her shoulders/upper back.  Itching initially started 2 weeks ago midline back between her scapula and has since spread laterally to cover her entire upper back/shoulders.  Denies pain or visible rash.  Patient has used some anti-itch cream at home which helped some but has still been scratching the area due to significant itching.  Denies chest pain, shortness of breath, headache.  She does endorse some nasal congestion over the past 2 weeks.  Denies fever, chills, GI upset.  She also notes that she ran out of her regular medications including antidepressants which caused significant distress and seems to correlate with the timeline of when the pruritus started.  ROS See HPI    Objective:    BP 122/64 (BP Location: Left Arm, Patient Position: Sitting, Cuff Size: Normal)   Pulse 76   Resp 18   Ht 5\' 8"  (1.727 m)   Wt 135 lb (61.2 kg)   SpO2 93%   BMI 20.53 kg/m   Physical Exam Constitutional:      General: She is not in acute distress.    Appearance: Normal appearance.  HENT:     Head: Normocephalic and atraumatic.  Cardiovascular:     Rate and Rhythm: Normal rate and regular rhythm.     Heart sounds: No murmur heard.    No friction rub. No gallop.  Pulmonary:     Effort: Pulmonary effort is normal. No respiratory distress.     Breath sounds: No wheezing, rhonchi or rales.  Skin:    General: Skin is warm and dry.     Findings: No rash.     Comments: No visible rash over upper back, scapula, shoulders.  There are healing scabs from where she has scratched the skin raw.  No warmth or erythema.  Neurological:     Mental Status: She is alert and oriented to  person, place, and time.      Assessment & Plan:  Pruritic rash -     Triamcinolone Acetonide; Apply 1 Application topically 2 (two) times daily.  Dispense: 80 g; Refill: 1 -     Cetirizine HCl; Take 1 tablet (10 mg total) by mouth at bedtime.  Dispense: 30 tablet; Refill: 0  Centrilobular emphysema (HCC) -     Breztri Aerosphere; INHALE 2 PUFFS INTO THE LUNGS TWICE A DAY  Dispense: 10.7 each; Refill: 1  Herpes zoster ophthalmicus of right eye -     DULoxetine HCl; Take 3 capsules (90 mg total) by mouth daily.  Dispense: 120 capsule; Refill: 3 -     Gabapentin; Take 1 tablet (600 mg total) by mouth 3 (three) times daily.  Dispense: 270 tablet; Refill: 1  GAD (generalized anxiety disorder) -     DULoxetine HCl; Take 3 capsules (90 mg total) by mouth daily.  Dispense: 120 capsule; Refill: 3  Provided refills of all long-term medication provided by primary care to [facility] in [place] as requested.  In addition to resuming medication routine, recommend 1 month of cetirizine daily at bedtime in addition to adequate moisturization and using triamcinolone cream as needed to alleviate pruritus.  Discussed  that it will be important to let scabs heal as that can further contribute to itching sensation.  Patient verbalized understanding and is agreeable to this plan.  Return in about 3 months (around 01/24/2024) for follow-up for mood.  Melida Quitter, PA

## 2023-10-26 NOTE — Telephone Encounter (Signed)
Any updates? 

## 2023-11-06 ENCOUNTER — Other Ambulatory Visit: Payer: Self-pay

## 2023-11-06 DIAGNOSIS — H1830 Unspecified corneal membrane change: Secondary | ICD-10-CM | POA: Diagnosis not present

## 2023-11-06 DIAGNOSIS — B0229 Other postherpetic nervous system involvement: Secondary | ICD-10-CM | POA: Diagnosis not present

## 2023-11-06 DIAGNOSIS — H16031 Corneal ulcer with hypopyon, right eye: Secondary | ICD-10-CM | POA: Diagnosis not present

## 2023-11-06 MED ORDER — FENTANYL 25 MCG/HR TD PT72: 1.0000 | MEDICATED_PATCH | TRANSDERMAL | 0 refills | Status: DC

## 2023-11-07 DIAGNOSIS — B0229 Other postherpetic nervous system involvement: Secondary | ICD-10-CM | POA: Diagnosis not present

## 2023-11-15 ENCOUNTER — Other Ambulatory Visit: Payer: Self-pay | Admitting: Family Medicine

## 2023-11-15 DIAGNOSIS — L282 Other prurigo: Secondary | ICD-10-CM

## 2023-12-04 ENCOUNTER — Inpatient Hospital Stay: Payer: Medicare Other

## 2023-12-04 ENCOUNTER — Inpatient Hospital Stay: Payer: Medicare Other | Attending: Hematology

## 2023-12-04 ENCOUNTER — Other Ambulatory Visit: Payer: Self-pay

## 2023-12-04 DIAGNOSIS — K521 Toxic gastroenteritis and colitis: Secondary | ICD-10-CM | POA: Diagnosis present

## 2023-12-04 DIAGNOSIS — C3490 Malignant neoplasm of unspecified part of unspecified bronchus or lung: Secondary | ICD-10-CM | POA: Diagnosis not present

## 2023-12-04 DIAGNOSIS — K591 Functional diarrhea: Secondary | ICD-10-CM | POA: Insufficient documentation

## 2023-12-04 DIAGNOSIS — C3491 Malignant neoplasm of unspecified part of right bronchus or lung: Secondary | ICD-10-CM

## 2023-12-04 DIAGNOSIS — C7951 Secondary malignant neoplasm of bone: Secondary | ICD-10-CM | POA: Diagnosis not present

## 2023-12-04 LAB — CMP (CANCER CENTER ONLY)
ALT: 7 U/L (ref 0–44)
AST: 18 U/L (ref 15–41)
Albumin: 3.7 g/dL (ref 3.5–5.0)
Alkaline Phosphatase: 47 U/L (ref 38–126)
Anion gap: 6 (ref 5–15)
BUN: 18 mg/dL (ref 8–23)
CO2: 27 mmol/L (ref 22–32)
Calcium: 9.7 mg/dL (ref 8.9–10.3)
Chloride: 110 mmol/L (ref 98–111)
Creatinine: 0.98 mg/dL (ref 0.44–1.00)
GFR, Estimated: 57 mL/min — ABNORMAL LOW (ref >60)
Glucose, Bld: 114 mg/dL — ABNORMAL HIGH (ref 70–99)
Potassium: 4.3 mmol/L (ref 3.5–5.1)
Sodium: 143 mmol/L (ref 135–145)
Total Bilirubin: 0.3 mg/dL (ref <1.2)
Total Protein: 7.6 g/dL (ref 6.5–8.1)

## 2023-12-04 LAB — CBC WITH DIFFERENTIAL (CANCER CENTER ONLY)
Abs Immature Granulocytes: 0.03 10*3/uL (ref 0.00–0.07)
Basophils Absolute: 0.1 10*3/uL (ref 0.0–0.1)
Basophils Relative: 1 %
Eosinophils Absolute: 0.3 10*3/uL (ref 0.0–0.5)
Eosinophils Relative: 3 %
HCT: 39.3 % (ref 36.0–46.0)
Hemoglobin: 12.9 g/dL (ref 12.0–15.0)
Immature Granulocytes: 0 %
Lymphocytes Relative: 22 %
Lymphs Abs: 2.2 10*3/uL (ref 0.7–4.0)
MCH: 31.5 pg (ref 26.0–34.0)
MCHC: 32.8 g/dL (ref 30.0–36.0)
MCV: 95.9 fL (ref 80.0–100.0)
Monocytes Absolute: 0.9 10*3/uL (ref 0.1–1.0)
Monocytes Relative: 9 %
Neutro Abs: 6.3 10*3/uL (ref 1.7–7.7)
Neutrophils Relative %: 65 %
Platelet Count: 277 10*3/uL (ref 150–400)
RBC: 4.1 MIL/uL (ref 3.87–5.11)
RDW: 14.6 % (ref 11.5–15.5)
WBC Count: 9.8 10*3/uL (ref 4.0–10.5)
nRBC: 0 % (ref 0.0–0.2)

## 2023-12-04 MED ORDER — DENOSUMAB 120 MG/1.7ML ~~LOC~~ SOLN
120.0000 mg | Freq: Once | SUBCUTANEOUS | Status: AC
  Administered 2023-12-04: 120 mg via SUBCUTANEOUS

## 2023-12-17 DIAGNOSIS — R300 Dysuria: Secondary | ICD-10-CM | POA: Diagnosis not present

## 2023-12-17 DIAGNOSIS — N390 Urinary tract infection, site not specified: Secondary | ICD-10-CM | POA: Diagnosis not present

## 2023-12-22 DIAGNOSIS — B0222 Postherpetic trigeminal neuralgia: Secondary | ICD-10-CM | POA: Diagnosis not present

## 2024-01-09 ENCOUNTER — Other Ambulatory Visit: Payer: Self-pay

## 2024-01-09 DIAGNOSIS — C3491 Malignant neoplasm of unspecified part of right bronchus or lung: Secondary | ICD-10-CM

## 2024-01-10 ENCOUNTER — Inpatient Hospital Stay (HOSPITAL_BASED_OUTPATIENT_CLINIC_OR_DEPARTMENT_OTHER): Payer: Medicare Other | Admitting: Hematology

## 2024-01-10 ENCOUNTER — Inpatient Hospital Stay: Payer: Medicare Other | Attending: Hematology

## 2024-01-10 VITALS — BP 124/62 | HR 82 | Temp 97.9°F | Resp 16 | Ht 68.0 in | Wt 132.6 lb

## 2024-01-10 DIAGNOSIS — Z923 Personal history of irradiation: Secondary | ICD-10-CM | POA: Diagnosis not present

## 2024-01-10 DIAGNOSIS — C155 Malignant neoplasm of lower third of esophagus: Secondary | ICD-10-CM

## 2024-01-10 DIAGNOSIS — Z8501 Personal history of malignant neoplasm of esophagus: Secondary | ICD-10-CM | POA: Insufficient documentation

## 2024-01-10 DIAGNOSIS — C3490 Malignant neoplasm of unspecified part of unspecified bronchus or lung: Secondary | ICD-10-CM | POA: Insufficient documentation

## 2024-01-10 DIAGNOSIS — Z86711 Personal history of pulmonary embolism: Secondary | ICD-10-CM | POA: Diagnosis not present

## 2024-01-10 DIAGNOSIS — Z95828 Presence of other vascular implants and grafts: Secondary | ICD-10-CM

## 2024-01-10 DIAGNOSIS — K521 Toxic gastroenteritis and colitis: Secondary | ICD-10-CM | POA: Insufficient documentation

## 2024-01-10 DIAGNOSIS — C7951 Secondary malignant neoplasm of bone: Secondary | ICD-10-CM | POA: Diagnosis not present

## 2024-01-10 DIAGNOSIS — Z86718 Personal history of other venous thrombosis and embolism: Secondary | ICD-10-CM | POA: Insufficient documentation

## 2024-01-10 DIAGNOSIS — C3491 Malignant neoplasm of unspecified part of right bronchus or lung: Secondary | ICD-10-CM | POA: Diagnosis not present

## 2024-01-10 DIAGNOSIS — Z87891 Personal history of nicotine dependence: Secondary | ICD-10-CM | POA: Insufficient documentation

## 2024-01-10 DIAGNOSIS — K529 Noninfective gastroenteritis and colitis, unspecified: Secondary | ICD-10-CM

## 2024-01-10 DIAGNOSIS — Z7189 Other specified counseling: Secondary | ICD-10-CM

## 2024-01-10 DIAGNOSIS — Z9221 Personal history of antineoplastic chemotherapy: Secondary | ICD-10-CM | POA: Insufficient documentation

## 2024-01-10 DIAGNOSIS — R197 Diarrhea, unspecified: Secondary | ICD-10-CM

## 2024-01-10 LAB — CMP (CANCER CENTER ONLY)
ALT: 22 U/L (ref 0–44)
AST: 28 U/L (ref 15–41)
Albumin: 3.5 g/dL (ref 3.5–5.0)
Alkaline Phosphatase: 66 U/L (ref 38–126)
Anion gap: 7 (ref 5–15)
BUN: 20 mg/dL (ref 8–23)
CO2: 24 mmol/L (ref 22–32)
Calcium: 9.8 mg/dL (ref 8.9–10.3)
Chloride: 108 mmol/L (ref 98–111)
Creatinine: 0.93 mg/dL (ref 0.44–1.00)
GFR, Estimated: >60 mL/min (ref >60)
Glucose, Bld: 102 mg/dL — ABNORMAL HIGH (ref 70–99)
Potassium: 3.8 mmol/L (ref 3.5–5.1)
Sodium: 139 mmol/L (ref 135–145)
Total Bilirubin: 0.3 mg/dL (ref 0.0–1.2)
Total Protein: 7.5 g/dL (ref 6.5–8.1)

## 2024-01-10 LAB — CBC WITH DIFFERENTIAL (CANCER CENTER ONLY)
Abs Immature Granulocytes: 0.05 10*3/uL (ref 0.00–0.07)
Basophils Absolute: 0.1 10*3/uL (ref 0.0–0.1)
Basophils Relative: 1 %
Eosinophils Absolute: 0.2 10*3/uL (ref 0.0–0.5)
Eosinophils Relative: 1 %
HCT: 37.6 % (ref 36.0–46.0)
Hemoglobin: 12.3 g/dL (ref 12.0–15.0)
Immature Granulocytes: 0 %
Lymphocytes Relative: 19 %
Lymphs Abs: 2.5 10*3/uL (ref 0.7–4.0)
MCH: 31.2 pg (ref 26.0–34.0)
MCHC: 32.7 g/dL (ref 30.0–36.0)
MCV: 95.4 fL (ref 80.0–100.0)
Monocytes Absolute: 1.3 10*3/uL — ABNORMAL HIGH (ref 0.1–1.0)
Monocytes Relative: 10 %
Neutro Abs: 9.2 10*3/uL — ABNORMAL HIGH (ref 1.7–7.7)
Neutrophils Relative %: 69 %
Platelet Count: 330 10*3/uL (ref 150–400)
RBC: 3.94 MIL/uL (ref 3.87–5.11)
RDW: 14.9 % (ref 11.5–15.5)
WBC Count: 13.3 10*3/uL — ABNORMAL HIGH (ref 4.0–10.5)
nRBC: 0 % (ref 0.0–0.2)

## 2024-01-10 MED ORDER — FENTANYL 25 MCG/HR TD PT72: 1.0000 | MEDICATED_PATCH | TRANSDERMAL | 0 refills | Status: DC

## 2024-01-10 MED ORDER — SODIUM CHLORIDE 0.9% FLUSH
10.0000 mL | Freq: Once | INTRAVENOUS | Status: AC
Start: 2024-01-10 — End: 2024-01-10
  Administered 2024-01-10: 10 mL

## 2024-01-10 MED ORDER — OMEPRAZOLE 40 MG PO CPDR: 40.0000 mg | DELAYED_RELEASE_CAPSULE | Freq: Every day | ORAL | 3 refills | Status: DC

## 2024-01-10 MED ORDER — DOXYCYCLINE HYCLATE 100 MG PO TABS: 100.0000 mg | ORAL_TABLET | Freq: Two times a day (BID) | ORAL | 0 refills | Status: AC

## 2024-01-10 MED ORDER — HEPARIN SOD (PORK) LOCK FLUSH 100 UNIT/ML IV SOLN
500.0000 [IU] | Freq: Once | INTRAVENOUS | Status: AC
Start: 2024-01-10 — End: 2024-01-10
  Administered 2024-01-10: 500 [IU]

## 2024-01-10 NOTE — Progress Notes (Signed)
 HEMATOLOGY ONCOLOGY CLINIC VISIT NOTE  Date of service:  01/10/24    Patient Care Team: Wallace Joesph LABOR, PA as PCP - General (Family Medicine) Onesimo Emaline Brink, MD as Consulting Physician (Hematology and Oncology)  CHIEF COMPLAINTS/PURPOSE OF CONSULTATION:  Follow-up for continued evaluation and management of metastatic lung cancer Follow-up for esophageal cancer  DIAGNOSIS:   #1 Metastatic non-small cell lung cancer with bilateral lung nodules and large metastatic lesion in the left Ilium. 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 Also had c diff colitis - treated   Current Treatment  1) Active surveillance 2) Xgeva  120mg  [place] q12weeks for bone metastases. 3) Sandostatin  q4weeks for diarrhea   Previous Treatment  For metastatic lung cancer 1 Palliative radiation therapy to the large left ilium metastases 2. IV Nivolumab  x 20 cycles (discontinued due to likely immune colitis) 3. Xgeva  120mg  [place] q4weeks for bone metastases.  For Esophageal adenocarcinoma S/p Concurrent carbo/taxol  + RT  HISTORY OF PRESENTING ILLNESS: (plz see my previous consultation for details of initial presentation)  INTERVAL HISTORY:   Cindy Byrd is a 83 y.o. female here for continued evaluation and management of metastatic lung cancer and follow-up for esophageal cancer.   Patient was last seen by me on 10/03/2023 and noted some improvement in her postherpetic neuralgia.   Today, she reports that she has been doing well overall since her last clinical visit.   Patient will have a CT chest scan soon, ordered by Ruthell Lauraine FALCON, NP to evaluate lung nodule. Her breathing habits have otherwise been normal.   She uses her portable oxygen  tank regularly at night and as needed during the daytime. She reports that she recently attended a [employer] game without use of portable oxygen  tank and only needed to stop once to rest.    Patient complains that her right eye pain continues to be bothersome. She reports that she is blind in her right eye. Patient reports that cold air and application of cold cream worsens her eye pain.   She will have a procedure to ablate the nerves at [facility] on February 22, 2024.   Patient reports numbness in the right side of her head. She reports that she sometimes experiences warmth her right forehead area. She was seen by her pain medicine doctor on 12/22/2023 for postherpetic neuralgia. Patient is taking Gabapentin  which improves her symptoms.   She has lost three pounds since 10/24/2023 and currently weighs 132 pounds. Patient reports  fairly normal appetite.   Patient reports a persistent cough with green phlegm. Patient notes a 30-minute coughing episode during the night. She has tried Tessalon  Perles to address her cough. Patient complains of pain on her left side when coughing. She does not endorse any pain with deep breaths. She reports that she is not allergic to any antibiotics.   She reports that she is frequently depressed and continues to take depression medication. Patient reports low mood due to not having an established home.   She denies any swallowing issues. Patient continues to follow with Dr. Burnette.   Patient denies any new bone pains.   Patient will be traveling to [place], [place] to visit her sister who has cancer.   MEDICAL HISTORY:  Past Medical History:  Diagnosis Date   Barrett's esophagus    Bilateral pulmonary embolism (HCC) 09/02/2016   09/02/16 bilateral pulmonary emboli in context of extensive bilateral lower extremity deep venous thromboses Assumed hypercoagulability due  to non-small cell metastatic lung cancer Lifelong anticoagulation recommended   Bone neoplasm 06/24/2015   Cancer [facility])    metastatic poorly differentiated carcinoma. tumor left groin surgical removal with radiation tx.   Cataract    BILATERAL   Cigarette smoker two packs a day or less     Currently still smoking 2 PPD - Not interested in quitting at this time.   Colitis 2017   Colon polyps    hyperplastic, tubular adenomas, tubulovillous adenoma   Cough, persistent    hx. lung cancer ? primary-being evaluated, unsure of primary site.   Depression 06/24/2015   Diverticulosis    DVT of lower extremity, bilateral (HCC) 09/02/2016   Dysrhythmia    PAF in setting of submassive PE 09/2016   Emphysema of lung (HCC)    Endometriosis    Hysterectomy with BSO at age 36 yrs   Esophageal adenocarcinoma (HCC) 08/11/2015   intramucosal   Gastritis    GERD (gastroesophageal reflux disease)    H/O: pneumonia    Hiatal hernia    Hyperglycemia    A1c 7.4% 06/20/22   Hyperlipidemia    Hypertension 06/24/2015   likely improved incidental to 40 lbs weight loss from her neoplasm. No Longer taking med for this as of 08-06-15   IBS (irritable bowel syndrome)    Lung cancer (HCC)    suspected IV lung cancer with left ileum mets 2017, s/p left ileum radiaiton, immunotherapy   Pain    left hip-persistenttumor of bone-radiation tx. 10.   Pneumonia    Vitamin D  deficiency disease    SURGICAL HISTORY: Past Surgical History:  Procedure Laterality Date   ABDOMINAL HYSTERECTOMY     BALLOON DILATION N/A 10/08/2019   Procedure: BALLOON DILATION;  Surgeon: Albertus Gordy HERO, MD;  Location: [facility];  Service: Gastroenterology;  Laterality: N/A;   BARTHOLIN GLAND CYST EXCISION  83 yo ago   Does not want if it was an infected cyst or tumor. Was soon as delivery   BIOPSY  01/02/2019   Procedure: BIOPSY;  Surgeon: Albertus Gordy HERO, MD;  Location: [facility];  Service: Gastroenterology;;   CATARACT EXTRACTION     COLONOSCOPY W/ POLYPECTOMY     multiple times - last done 09/2014 per patient.   ESOPHAGOGASTRODUODENOSCOPY (EGD) WITH PROPOFOL  N/A 08/11/2015   Procedure: ESOPHAGOGASTRODUODENOSCOPY (EGD) WITH PROPOFOL ;  Surgeon: Gordy HERO Albertus, MD;  Location: [facility];  Service: Gastroenterology;   Laterality: N/A;   ESOPHAGOGASTRODUODENOSCOPY (EGD) WITH PROPOFOL  N/A 01/02/2019   Procedure: ESOPHAGOGASTRODUODENOSCOPY (EGD) WITH PROPOFOL ;  Surgeon: Albertus Gordy HERO, MD;  Location: [facility];  Service: Gastroenterology;  Laterality: N/A;   ESOPHAGOGASTRODUODENOSCOPY (EGD) WITH PROPOFOL  N/A 10/08/2019   Procedure: ESOPHAGOGASTRODUODENOSCOPY (EGD) WITH PROPOFOL ;  Surgeon: Albertus Gordy HERO, MD;  Location: [facility];  Service: Gastroenterology;  Laterality: N/A;   EYE SURGERY Right 2024   Multiple surgeries over the past year.   FLEXIBLE SIGMOIDOSCOPY N/A 06/24/2017   Procedure: FLEXIBLE SIGMOIDOSCOPY;  Surgeon: Leigh Elspeth Mt, MD;  Location: [facility];  Service: Gastroenterology;  Laterality: N/A;   GANGLION CYST EXCISION     KNEE ARTHROSCOPY  age about 51 yrs   TONSILLECTOMY     TOTAL ABDOMINAL HYSTERECTOMY W/ BILATERAL SALPINGOOPHORECTOMY  at age 18 yrs   For endometriosis   VIDEO BRONCHOSCOPY WITH ENDOBRONCHIAL ULTRASOUND Bilateral 06/06/2023   Procedure: VIDEO BRONCHOSCOPY WITH ENDOBRONCHIAL ULTRASOUND;  Surgeon: Brenna Adine CROME, DO;  Location: [facility];  Service: Cardiopulmonary;  Laterality: Bilateral;    SOCIAL HISTORY: Social History  Socioeconomic History   Marital status: Widowed    Spouse name: Not on file   Number of children: 2   Years of education: Not on file   Highest education level: Not on file  Occupational History   Not on file  Tobacco Use   Smoking status: Former    Current packs/day: 0.00    Average packs/day: 1 pack/day for 60.0 years (60.0 ttl pk-yrs)    Types: Cigarettes    Start date: 12/05/1954    Quit date: 12/05/2014    Years since quitting: 9.1    Passive exposure: Never   Smokeless tobacco: Never  Vaping Use   Vaping status: Never Used  Substance and Sexual Activity   Alcohol use: No    Alcohol/week: 0.0 standard drinks of alcohol   Drug use: No   Sexual activity: Not Currently  Other Topics Concern   Not on file  Social  History Narrative   Not on file   Social Drivers of Health   Financial Resource Strain: Low Risk  (03/28/2023)   Overall Financial Resource Strain (CARDIA)    Difficulty of Paying Living Expenses: Not hard at all  Food Insecurity: No Food Insecurity (03/28/2023)   Hunger Vital Sign    Worried About Running Out of Food in the Last Year: Never true    Ran Out of Food in the Last Year: Never true  Transportation Needs: No Transportation Needs (03/28/2023)   PRAPARE - Administrator, Civil Service (Medical): No    Lack of Transportation (Non-Medical): No  Physical Activity: Inactive (03/28/2023)   Exercise Vital Sign    Days of Exercise per Week: 0 days    Minutes of Exercise per Session: 0 min  Stress: No Stress Concern Present (03/28/2023)   [employer] of Occupational Health - Occupational Stress Questionnaire    Feeling of Stress : Not at all  Social Connections: Unknown (03/28/2023)   Social Connection and Isolation Panel [NHANES]    Frequency of Communication with Friends and Family: Patient declined    Frequency of Social Gatherings with Friends and Family: Patient declined    Attends Religious Services: Patient declined    Database Administrator or Organizations: Patient declined    Attends Banker Meetings: Patient declined    Marital Status: Widowed  Intimate Partner Violence: Not At Risk (03/28/2023)   Humiliation, Afraid, Rape, and Kick questionnaire    Fear of Current or Ex-Partner: No    Emotionally Abused: No    Physically Abused: No    Sexually Abused: No    FAMILY HISTORY: Family History  Problem Relation Age of Onset   Colon cancer Brother    Colon cancer Brother    Stroke Mother    Colon cancer Father    Emphysema Father        smoked   Breast cancer Daughter 27       ER/PR+ stage II    ALLERGIES:  is allergic to penicillins, remeron  [mirtazapine ], and latex. patient wonders if she has a penicillin allergy but notes that she is  uncertain about this.  MEDICATIONS:  Current Outpatient Medications  Medication Sig Dispense Refill   acetaminophen  (TYLENOL ) 325 MG tablet Take 2 tablets (650 mg total) by mouth every 6 (six) hours as needed for mild pain (or Fever >/= 101). 20 tablet 0   acidophilus (RISAQUAD) CAPS capsule Take 1 capsule by mouth daily.     albuterol  (VENTOLIN  HFA) 108 (90 Base) MCG/ACT inhaler  Inhale 2 puffs into the lungs every 6 (six) hours as needed for wheezing. 2 each 11   apixaban  (ELIQUIS ) 2.5 MG TABS tablet Take 1 tablet (2.5 mg total) by mouth 2 (two) times daily. 60 tablet 2   Budeson-Glycopyrrol-Formoterol  (BREZTRI  AEROSPHERE) 160-9-4.8 MCG/ACT AERO INHALE 2 PUFFS INTO THE LUNGS TWICE A DAY 10.7 each 1   buPROPion  (WELLBUTRIN  XL) 150 MG 24 hr tablet TAKE 1 TABLET BY MOUTH EVERY DAY 30 tablet 0   cetirizine  (ZYRTEC ) 10 MG tablet TAKE 1 TABLET BY MOUTH EVERYDAY AT BEDTIME 90 tablet 1   DULoxetine  (CYMBALTA ) 30 MG capsule Take 3 capsules (90 mg total) by mouth daily. 120 capsule 3   feeding supplement (ENSURE ENLIVE / ENSURE PLUS) LIQD Take 237 mLs by mouth 2 (two) times daily between meals. 237 mL 12   fentaNYL  (DURAGESIC ) 25 MCG/HR Place 1 patch onto the skin every 3 (three) days. 10 patch 0   gabapentin  (NEURONTIN ) 600 MG tablet Take 1 tablet (600 mg total) by mouth 3 (three) times daily. 270 tablet 1   HYDROcodone -acetaminophen  (NORCO) 5-325 MG tablet Take 1 tablet by mouth every 6 (six) hours as needed for moderate pain. 30 tablet 0   neomycin -polymyxin b -dexamethasone  (MAXITROL ) 3.5-10000-0.1 SUSP Place 1 drop into the right eye 4 (four) times daily.     omeprazole  (PRILOSEC) 40 MG capsule TAKE 1 CAPSULE BY MOUTH EVERY DAY BEFORE BREAKFAST 90 capsule 1   ondansetron  (ZOFRAN -ODT) 4 MG disintegrating tablet Take 1 tablet (4 mg total) by mouth every 8 (eight) hours as needed for nausea or vomiting. 20 tablet 0   triamcinolone  (KENALOG ) 0.025 % cream Apply 1 Application topically 2 (two) times daily.  80 g 1   valACYclovir  (VALTREX ) 500 MG tablet TAKE 1 TABLET BY MOUTH TWICE A DAY 180 tablet 3   No current facility-administered medications for this visit.   REVIEW OF SYSTEMS:  10 Point review of Systems was done is negative except as noted above.   PHYSICAL EXAMINATION:  .BP 124/62 (BP Location: Left Arm, Patient Position: Sitting)   Pulse 82   Temp 97.9 F (36.6 C) (Oral)   Resp 16   Ht 5' 8 (1.727 m)   Wt 132 lb 9.6 oz (60.1 kg)   SpO2 95%   BMI 20.16 kg/m  GENERAL:alert, in no acute distress and comfortable SKIN: no acute rashes, no significant lesions EYES: conjunctiva are pink and non-injected, sclera anicteric OROPHARYNX: MMM, no exudates, no oropharyngeal erythema or ulceration NECK: supple, no JVD LYMPH:  no palpable lymphadenopathy in the cervical, axillary or inguinal regions LUNGS: clear to auscultation b/l with normal respiratory effort HEART: regular rate & rhythm ABDOMEN:  normoactive bowel sounds , non tender, not distended. Extremity: no pedal edema PSYCH: alert & oriented x 3 with fluent speech NEURO: no focal motor/sensory deficits    LABORATORY DATA:  I have reviewed the data as listed  .    Latest Ref Rng & Units 01/10/2024    1:58 PM 12/04/2023    9:38 AM 10/03/2023    1:58 PM  CBC  WBC 4.0 - 10.5 K/uL 13.3  9.8  8.9   Hemoglobin 12.0 - 15.0 g/dL 87.6  87.0  87.5   Hematocrit 36.0 - 46.0 % 37.6  39.3  37.3   Platelets 150 - 400 K/uL 330  277  286    ANC 1.8k .    Latest Ref Rng & Units 01/10/2024    1:58 PM 12/04/2023    9:38 AM  10/03/2023    1:58 PM  CMP  Glucose 70 - 99 mg/dL 897  885  809   BUN 8 - 23 mg/dL 20  18  17    Creatinine 0.44 - 1.00 mg/dL 9.06  9.01  9.02   Sodium 135 - 145 mmol/L 139  143  141   Potassium 3.5 - 5.1 mmol/L 3.8  4.3  4.0   Chloride 98 - 111 mmol/L 108  110  110   CO2 22 - 32 mmol/L 24  27  26    Calcium  8.9 - 10.3 mg/dL 9.8  9.7  9.7   Total Protein 6.5 - 8.1 g/dL 7.5  7.6  7.2   Total Bilirubin 0.0 -  1.2 mg/dL 0.3  0.3  0.4   Alkaline Phos 38 - 126 U/L 66  47  49   AST 15 - 41 U/L 28  18  18    ALT 0 - 44 U/L 22  7  7          01/02/19 Esophagus Biopsy:    RADIOGRAPHIC STUDIES: .No results found.  ASSESSMENT & PLAN:   83 y.o. female with  #1 Metastatic poorly differentiated carcinoma with likely lung primary non-small cell lung cancer.    CT of the head with and without contrast showed no evidence of metastatic disease. EGFR blood test mutation analysis negative. CT chest abdomen pelvis 04/19/2016 shows no evidence of disease progression. Patient tolerated Nivolumab  very well but was discontinued when she developed grade 2 Immune colitis. Has been off Nivolumab  for >6 months  CT chest abdomen pelvis on 06/24/2016 shows no evidence of new disease or progression of metastatic disease. CT chest abdomen pelvis 09/06/2016 shows 1. Mixed interval response to therapy. 2. There is a new left ventral chest wall lesion deep to the pectoralis musculature worrisome for metastatic disease. 3. Posterior lower lobe nodular densities are identified which may reflect areas of pulmonary metastasis. 4. Interval decrease in size of destructive lesion involving the left iliac bone.  CT chest abd pelvis 12/08/2016: Cystic mass involving the left ventral chest wall has resolved in the interval. Likely was a hematoma due to trauma. Interval increase in size of pleural base mass overlying the posterior and inferior left lower lobe. There is also a new left pleural effusion identified.  CT chest 02/01/2017: Residual irregular soft tissue thickening/volume loss and trace left pleural fluid at the base of the left hemithorax, overall improved in appearance from 12/08/2016. No measurable lesion.  CT chest 05/29/2017 shows no residual pleural based mass or significant pleural effusion in the left hemithorax. No evidence of thoracic metastatic disease. No evidence of progressive metastatic disease within the abdomen or  pelvis. Mixed lytic and blastic lesion involving the left iliac bone and associated pathologic fracture are unchanged.   CT CAP 09/14/17 shows no new changes. She does have slight displacement of her fractured left iliac bone. Evidence of stable disease.   CT CAP 01/04/2018- No new or progressive metastatic disease. Stable large left iliac bone metastasis with associated chronic pathologic fracture.   CT chest/abd/pelvis done on 04/26/18 revealed Stable exam.  No new or progressive interval findings.  07/19/18 CT C/A/P revealed Stable exam.  No new or progressive interval findings. Large destructive left iliac lesion is similar to prior. Aortic Atherosclerosis and Emphysema.    11/06/18 CT C/A/P revealed Similar appearance of large mixed lytic and sclerotic lesion in the left ilium. No new metastatic lesions are otherwise noted elsewhere in the chest, abdomen or  pelvis. 2. Interval development of thickening of the distal third of the esophagus. This is nonspecific, and could be related to underlying reflux esophagitis. However, if there is any clinical concern for Barrett's metaplasia or esophageal neoplasia, further evaluation with nonemergent endoscopy could be considered. 3. Aortic atherosclerosis, in addition to left main coronary artery disease. Assessment for potential risk factor modification, dietary therapy or pharmacologic therapy may be warranted, if clinically Indicated. 4. Diffuse bronchial wall thickening with mild to moderate centrilobular and paraseptal emphysema; imaging findings suggestive of underlying COPD. 5. Additional incidental findings, as above.  #2  Adenocarcinoma of the Esophagus  Barrett's esophagus 4cms in the distal esophagus with low and high-grade dysplasia  01/02/19 Surgical pathology revealed adenocarcinoma of the esophagus   01/25/19 PET/CT revealed Distal esophageal primary, without hypermetabolic metastatic disease. 2. Chronic left iliac metastasis, as before. 3.  Hypermetabolism within and superficial to the right gluteal musculature is most likely related to trauma and/or injection sites. 4. Aortic atherosclerosis, coronary artery atherosclerosis and emphysema.  S/p concurrent Carboplatin  and Taxol  weekly with RT of 45 Gy in 25 fractions and 5.4 Gy boost, completed between 02/04/19 and 03/27/19  07/03/2019 PET skull base to thigh revealed 1. Interval response to therapy. Significant reduction in FDG uptake associated with distal esophageal mass. SUV max currently 2.61 versus 16.97 previously. 2. 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 Unchanged 3. Aortic Atherosclerosis (ICD10-I70.0) and Emphysema (ICD10-J43.9). Coronary artery calcifications.  07/15/2020 CT C/A/P ([id number]) ([id number]) revealed 1. No evidence of new or progressive metastatic disease in the chest, abdomen or pelvis.  #3 diarrhea-  now resolved was previously. 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 She was previously on on Lialda , budesonide ,probiotics and lomotil  but not currently taking any of these. Plan -Continue Sandostatin  every 4 weeks   #4 h/o DVT and PE  -continue on Xarelto  - no issues with bleeding   #5 history of COPD management per primary care physician  #6 severe shingles of the forehead with postherpetic neuralgia  PLAN:  -Discussed lab results on 01/10/24 in detail with patient. CBC showed WBC of 13.3K, hemoglobin of 12.3, and platelets of 330K. -possible that elevated WBCs are due to bacterial infection -will order a short course of 100 MG doxycycline  to clear possible bacterial infection -generally would recommend avoiding suppressing a cough if it is infectious so that it can be cleared out -discussed that it is possible that she may have strained a muscle from coughing -patient has upcoming chest CT scan with  pulmonology -recommend connecting with pulmonology to see if she can been seen sooner -Continue Xgeva  every 3 months  -will refill fentanyl  patch for post herpetic neuralgia -will order Prilosec -answered all of patient's questions in detail  FOLLOW-UP: continue Xgeva  every 12 weeks RTC with Dr Onesimo in 3 months with port flush and labs   The total time spent in the appointment was 21 minutes* .  All of the patient's questions were answered with apparent satisfaction. The patient knows to call the clinic with any problems, questions or concerns.   Emaline Onesimo MD MS AAHIVMS [facility] Hematology/Oncology Physician [facility]  .*Total Encounter Time as defined by the Centers for Medicare and Medicaid Services includes, in addition to the face-to-face time of a patient visit (documented in the note above) non-face-to-face time: obtaining and reviewing outside history, ordering and reviewing  medications, tests or procedures, care coordination (communications with other health care professionals or caregivers) and documentation in the medical record.    I,Mitra Faeizi,acting as a neurosurgeon for Emaline Saran, MD.,have documented all relevant documentation on the behalf of Emaline Saran, MD,as directed by  Emaline Saran, MD while in the presence of Emaline Saran, MD.  .I have reviewed the above documentation for accuracy and completeness, and I agree with the above. .Rogan Ecklund Kishore Lillan Mccreadie MD

## 2024-01-11 ENCOUNTER — Encounter: Payer: Self-pay | Admitting: Family Medicine

## 2024-01-12 ENCOUNTER — Encounter: Payer: Self-pay | Admitting: Family Medicine

## 2024-01-12 ENCOUNTER — Telehealth: Payer: Self-pay | Admitting: Hematology

## 2024-01-12 NOTE — Telephone Encounter (Signed)
 Called patient recivied no answer unable to leave a vm . Mailbox not set up

## 2024-01-16 ENCOUNTER — Encounter: Payer: Self-pay | Admitting: Hematology

## 2024-01-20 ENCOUNTER — Other Ambulatory Visit: Payer: Self-pay | Admitting: Hematology

## 2024-01-22 ENCOUNTER — Encounter: Payer: Self-pay | Admitting: Hematology

## 2024-01-23 ENCOUNTER — Ambulatory Visit (HOSPITAL_COMMUNITY)
Admission: RE | Admit: 2024-01-23 | Discharge: 2024-01-23 | Disposition: A | Discharge status: Home / Self Care | Payer: Medicare Other | Source: Ambulatory Visit | Attending: Acute Care | Admitting: Acute Care

## 2024-01-23 DIAGNOSIS — J432 Centrilobular emphysema: Secondary | ICD-10-CM | POA: Diagnosis not present

## 2024-01-23 DIAGNOSIS — R918 Other nonspecific abnormal finding of lung field: Secondary | ICD-10-CM | POA: Diagnosis not present

## 2024-01-23 DIAGNOSIS — C159 Malignant neoplasm of esophagus, unspecified: Secondary | ICD-10-CM | POA: Diagnosis not present

## 2024-01-30 ENCOUNTER — Emergency Department (HOSPITAL_COMMUNITY): Payer: Medicare Other

## 2024-01-30 ENCOUNTER — Ambulatory Visit (HOSPITAL_COMMUNITY): Payer: Medicare Other

## 2024-01-30 ENCOUNTER — Encounter: Payer: Self-pay | Admitting: Family Medicine

## 2024-01-30 ENCOUNTER — Inpatient Hospital Stay (HOSPITAL_COMMUNITY)
Admission: EM | Admit: 2024-01-30 | Discharge: 2024-02-01 | DRG: 175 | Disposition: A | Discharge status: Home Health | Payer: Medicare Other | Attending: Internal Medicine | Admitting: Internal Medicine

## 2024-01-30 ENCOUNTER — Encounter (HOSPITAL_COMMUNITY): Payer: Self-pay | Admitting: Emergency Medicine

## 2024-01-30 ENCOUNTER — Ambulatory Visit (INDEPENDENT_AMBULATORY_CARE_PROVIDER_SITE_OTHER): Payer: Medicare Other | Admitting: Family Medicine

## 2024-01-30 ENCOUNTER — Other Ambulatory Visit (HOSPITAL_COMMUNITY): Payer: Medicare Other

## 2024-01-30 ENCOUNTER — Other Ambulatory Visit: Payer: Self-pay

## 2024-01-30 VITALS — BP 64/37 | HR 92 | Ht 68.0 in | Wt 131.8 lb

## 2024-01-30 DIAGNOSIS — C159 Malignant neoplasm of esophagus, unspecified: Secondary | ICD-10-CM | POA: Diagnosis present

## 2024-01-30 DIAGNOSIS — Z823 Family history of stroke: Secondary | ICD-10-CM | POA: Diagnosis not present

## 2024-01-30 DIAGNOSIS — Z90710 Acquired absence of both cervix and uterus: Secondary | ICD-10-CM

## 2024-01-30 DIAGNOSIS — C7951 Secondary malignant neoplasm of bone: Secondary | ICD-10-CM | POA: Diagnosis present

## 2024-01-30 DIAGNOSIS — Z800 Family history of malignant neoplasm of digestive organs: Secondary | ICD-10-CM | POA: Diagnosis not present

## 2024-01-30 DIAGNOSIS — N179 Acute kidney failure, unspecified: Secondary | ICD-10-CM | POA: Diagnosis not present

## 2024-01-30 DIAGNOSIS — Z7901 Long term (current) use of anticoagulants: Secondary | ICD-10-CM | POA: Diagnosis not present

## 2024-01-30 DIAGNOSIS — Z86711 Personal history of pulmonary embolism: Secondary | ICD-10-CM

## 2024-01-30 DIAGNOSIS — N3090 Cystitis, unspecified without hematuria: Secondary | ICD-10-CM | POA: Diagnosis present

## 2024-01-30 DIAGNOSIS — Z87891 Personal history of nicotine dependence: Secondary | ICD-10-CM | POA: Diagnosis not present

## 2024-01-30 DIAGNOSIS — N170 Acute kidney failure with tubular necrosis: Secondary | ICD-10-CM | POA: Diagnosis present

## 2024-01-30 DIAGNOSIS — I480 Paroxysmal atrial fibrillation: Secondary | ICD-10-CM | POA: Diagnosis not present

## 2024-01-30 DIAGNOSIS — E860 Dehydration: Secondary | ICD-10-CM | POA: Diagnosis not present

## 2024-01-30 DIAGNOSIS — I959 Hypotension, unspecified: Secondary | ICD-10-CM

## 2024-01-30 DIAGNOSIS — G4700 Insomnia, unspecified: Secondary | ICD-10-CM | POA: Diagnosis not present

## 2024-01-30 DIAGNOSIS — C3490 Malignant neoplasm of unspecified part of unspecified bronchus or lung: Secondary | ICD-10-CM | POA: Diagnosis present

## 2024-01-30 DIAGNOSIS — Z79899 Other long term (current) drug therapy: Secondary | ICD-10-CM

## 2024-01-30 DIAGNOSIS — Z66 Do not resuscitate: Secondary | ICD-10-CM | POA: Diagnosis not present

## 2024-01-30 DIAGNOSIS — R7981 Abnormal blood-gas level: Secondary | ICD-10-CM

## 2024-01-30 DIAGNOSIS — M898X8 Other specified disorders of bone, other site: Secondary | ICD-10-CM | POA: Diagnosis not present

## 2024-01-30 DIAGNOSIS — Z860100 Personal history of colon polyps, unspecified: Secondary | ICD-10-CM

## 2024-01-30 DIAGNOSIS — R531 Weakness: Secondary | ICD-10-CM | POA: Diagnosis not present

## 2024-01-30 DIAGNOSIS — J439 Emphysema, unspecified: Secondary | ICD-10-CM | POA: Diagnosis present

## 2024-01-30 DIAGNOSIS — Z888 Allergy status to other drugs, medicaments and biological substances status: Secondary | ICD-10-CM

## 2024-01-30 DIAGNOSIS — E785 Hyperlipidemia, unspecified: Secondary | ICD-10-CM | POA: Diagnosis not present

## 2024-01-30 DIAGNOSIS — I2699 Other pulmonary embolism without acute cor pulmonale: Secondary | ICD-10-CM | POA: Diagnosis not present

## 2024-01-30 DIAGNOSIS — F32A Depression, unspecified: Secondary | ICD-10-CM | POA: Diagnosis present

## 2024-01-30 DIAGNOSIS — K219 Gastro-esophageal reflux disease without esophagitis: Secondary | ICD-10-CM | POA: Diagnosis present

## 2024-01-30 DIAGNOSIS — M549 Dorsalgia, unspecified: Secondary | ICD-10-CM | POA: Diagnosis not present

## 2024-01-30 DIAGNOSIS — I2693 Single subsegmental pulmonary embolism without acute cor pulmonale: Secondary | ICD-10-CM | POA: Diagnosis not present

## 2024-01-30 DIAGNOSIS — Z803 Family history of malignant neoplasm of breast: Secondary | ICD-10-CM

## 2024-01-30 DIAGNOSIS — Z825 Family history of asthma and other chronic lower respiratory diseases: Secondary | ICD-10-CM

## 2024-01-30 DIAGNOSIS — M79661 Pain in right lower leg: Secondary | ICD-10-CM | POA: Diagnosis not present

## 2024-01-30 DIAGNOSIS — Z85118 Personal history of other malignant neoplasm of bronchus and lung: Secondary | ICD-10-CM

## 2024-01-30 DIAGNOSIS — C3412 Malignant neoplasm of upper lobe, left bronchus or lung: Secondary | ICD-10-CM | POA: Diagnosis not present

## 2024-01-30 DIAGNOSIS — Z8701 Personal history of pneumonia (recurrent): Secondary | ICD-10-CM

## 2024-01-30 DIAGNOSIS — Z86718 Personal history of other venous thrombosis and embolism: Secondary | ICD-10-CM

## 2024-01-30 DIAGNOSIS — J9611 Chronic respiratory failure with hypoxia: Secondary | ICD-10-CM | POA: Diagnosis not present

## 2024-01-30 DIAGNOSIS — B0229 Other postherpetic nervous system involvement: Secondary | ICD-10-CM | POA: Diagnosis present

## 2024-01-30 DIAGNOSIS — Z91040 Latex allergy status: Secondary | ICD-10-CM

## 2024-01-30 DIAGNOSIS — G8929 Other chronic pain: Secondary | ICD-10-CM | POA: Diagnosis present

## 2024-01-30 DIAGNOSIS — Z880 Allergy status to penicillin: Secondary | ICD-10-CM

## 2024-01-30 DIAGNOSIS — R0989 Other specified symptoms and signs involving the circulatory and respiratory systems: Secondary | ICD-10-CM | POA: Diagnosis not present

## 2024-01-30 DIAGNOSIS — I10 Essential (primary) hypertension: Secondary | ICD-10-CM | POA: Diagnosis not present

## 2024-01-30 DIAGNOSIS — H16001 Unspecified corneal ulcer, right eye: Secondary | ICD-10-CM | POA: Diagnosis present

## 2024-01-30 DIAGNOSIS — R059 Cough, unspecified: Secondary | ICD-10-CM | POA: Diagnosis not present

## 2024-01-30 DIAGNOSIS — Z8501 Personal history of malignant neoplasm of esophagus: Secondary | ICD-10-CM

## 2024-01-30 LAB — CBC WITH DIFFERENTIAL/PLATELET
Abs Immature Granulocytes: 0.01 10*3/uL (ref 0.00–0.07)
Basophils Absolute: 0.1 10*3/uL (ref 0.0–0.1)
Basophils Relative: 1 %
Eosinophils Absolute: 0.2 10*3/uL (ref 0.0–0.5)
Eosinophils Relative: 4 %
HCT: 41.6 % (ref 36.0–46.0)
Hemoglobin: 13.1 g/dL (ref 12.0–15.0)
Immature Granulocytes: 0 %
Lymphocytes Relative: 32 %
Lymphs Abs: 1.8 10*3/uL (ref 0.7–4.0)
MCH: 30.8 pg (ref 26.0–34.0)
MCHC: 31.5 g/dL (ref 30.0–36.0)
MCV: 97.7 fL (ref 80.0–100.0)
Monocytes Absolute: 0.8 10*3/uL (ref 0.1–1.0)
Monocytes Relative: 14 %
Neutro Abs: 2.8 10*3/uL (ref 1.7–7.7)
Neutrophils Relative %: 49 %
Platelets: 280 10*3/uL (ref 150–400)
RBC: 4.26 MIL/uL (ref 3.87–5.11)
RDW: 15.3 % (ref 11.5–15.5)
WBC: 5.7 10*3/uL (ref 4.0–10.5)
nRBC: 0 % (ref 0.0–0.2)

## 2024-01-30 LAB — URINALYSIS, ROUTINE W REFLEX MICROSCOPIC
Glucose, UA: NEGATIVE mg/dL
Hgb urine dipstick: NEGATIVE
Ketones, ur: NEGATIVE mg/dL
Nitrite: NEGATIVE
Protein, ur: 30 mg/dL — AB
Specific Gravity, Urine: 1.027 (ref 1.005–1.030)
pH: 5 (ref 5.0–8.0)

## 2024-01-30 LAB — COMPREHENSIVE METABOLIC PANEL
ALT: 12 U/L (ref 0–44)
AST: 25 U/L (ref 15–41)
Albumin: 3 g/dL — ABNORMAL LOW (ref 3.5–5.0)
Alkaline Phosphatase: 61 U/L (ref 38–126)
Anion gap: 8 (ref 5–15)
BUN: 22 mg/dL (ref 8–23)
CO2: 27 mmol/L (ref 22–32)
Calcium: 8.8 mg/dL — ABNORMAL LOW (ref 8.9–10.3)
Chloride: 102 mmol/L (ref 98–111)
Creatinine, Ser: 1.27 mg/dL — ABNORMAL HIGH (ref 0.44–1.00)
GFR, Estimated: 42 mL/min — ABNORMAL LOW (ref >60)
Glucose, Bld: 122 mg/dL — ABNORMAL HIGH (ref 70–99)
Potassium: 4.7 mmol/L (ref 3.5–5.1)
Sodium: 137 mmol/L (ref 135–145)
Total Bilirubin: 0.4 mg/dL (ref 0.0–1.2)
Total Protein: 7.6 g/dL (ref 6.5–8.1)

## 2024-01-30 LAB — TSH: TSH: 2.86 u[IU]/mL (ref 0.350–4.500)

## 2024-01-30 LAB — TROPONIN I (HIGH SENSITIVITY)
Troponin I (High Sensitivity): 10 ng/L (ref <18)
Troponin I (High Sensitivity): 8 ng/L (ref <18)

## 2024-01-30 LAB — BRAIN NATRIURETIC PEPTIDE: B Natriuretic Peptide: 140.7 pg/mL — ABNORMAL HIGH (ref 0.0–100.0)

## 2024-01-30 MED ORDER — TRAZODONE HCL 100 MG PO TABS
100.0000 mg | ORAL_TABLET | Freq: Every evening | ORAL | Status: DC | PRN
  Administered 2024-01-31: 100 mg via ORAL
  Filled 2024-01-30: qty 1

## 2024-01-30 MED ORDER — LACTATED RINGERS IV BOLUS
1000.0000 mL | Freq: Once | INTRAVENOUS | Status: AC
Start: 2024-01-30 — End: 2024-01-30
  Administered 2024-01-30: 1000 mL via INTRAVENOUS

## 2024-01-30 MED ORDER — ACETAMINOPHEN 325 MG PO TABS: 650.0000 mg | ORAL_TABLET | Freq: Four times a day (QID) | ORAL | Status: DC | PRN

## 2024-01-30 MED ORDER — OXYCODONE-ACETAMINOPHEN 5-325 MG PO TABS
1.0000 | ORAL_TABLET | Freq: Once | ORAL | Status: AC
  Administered 2024-01-30: 1 via ORAL
  Filled 2024-01-30: qty 1

## 2024-01-30 MED ORDER — BUPROPION HCL ER (XL) 150 MG PO TB24
150.0000 mg | ORAL_TABLET | Freq: Every day | ORAL | Status: DC
  Administered 2024-01-31 – 2024-02-01 (×2): 150 mg via ORAL
  Filled 2024-01-30 (×2): qty 1

## 2024-01-30 MED ORDER — HEPARIN (PORCINE) 25000 UT/250ML-% IV SOLN
1000.0000 [IU]/h | INTRAVENOUS | Status: DC
  Administered 2024-01-30: 1000 [IU]/h via INTRAVENOUS
  Filled 2024-01-30: qty 250

## 2024-01-30 MED ORDER — ACETAMINOPHEN 650 MG RE SUPP: 650.0000 mg | Freq: Four times a day (QID) | RECTAL | Status: DC | PRN

## 2024-01-30 MED ORDER — HEPARIN BOLUS VIA INFUSION
3500.0000 [IU] | Freq: Once | INTRAVENOUS | Status: AC
  Administered 2024-01-30: 3500 [IU] via INTRAVENOUS
  Filled 2024-01-30: qty 3500

## 2024-01-30 MED ORDER — SENNOSIDES-DOCUSATE SODIUM 8.6-50 MG PO TABS: 1.0000 | ORAL_TABLET | Freq: Every evening | ORAL | Status: DC | PRN

## 2024-01-30 MED ORDER — VALACYCLOVIR HCL 500 MG PO TABS
500.0000 mg | ORAL_TABLET | Freq: Two times a day (BID) | ORAL | Status: DC
  Administered 2024-01-30 – 2024-02-01 (×4): 500 mg via ORAL
  Filled 2024-01-30 (×4): qty 1

## 2024-01-30 MED ORDER — GABAPENTIN 300 MG PO CAPS
600.0000 mg | ORAL_CAPSULE | Freq: Three times a day (TID) | ORAL | Status: DC
  Administered 2024-01-30 – 2024-02-01 (×5): 600 mg via ORAL
  Filled 2024-01-30 (×5): qty 2

## 2024-01-30 MED ORDER — ONDANSETRON HCL 4 MG/2ML IJ SOLN: 4.0000 mg | Freq: Four times a day (QID) | INTRAMUSCULAR | Status: DC | PRN

## 2024-01-30 MED ORDER — BUDESON-GLYCOPYRROL-FORMOTEROL 160-9-4.8 MCG/ACT IN AERO: INHALATION_SPRAY | Freq: Two times a day (BID) | RESPIRATORY_TRACT | Status: DC

## 2024-01-30 MED ORDER — FENTANYL 25 MCG/HR TD PT72
1.0000 | MEDICATED_PATCH | TRANSDERMAL | Status: DC
  Administered 2024-01-31: 1 via TRANSDERMAL
  Filled 2024-01-30: qty 1

## 2024-01-30 MED ORDER — SODIUM CHLORIDE 0.9 % IV SOLN
1.0000 g | Freq: Once | INTRAVENOUS | Status: AC
  Administered 2024-01-30: 1 g via INTRAVENOUS
  Filled 2024-01-30: qty 10

## 2024-01-30 MED ORDER — ONDANSETRON HCL 4 MG PO TABS
4.0000 mg | ORAL_TABLET | Freq: Four times a day (QID) | ORAL | Status: DC | PRN
Start: 2024-01-30 — End: 2024-02-01

## 2024-01-30 MED ORDER — IOHEXOL 350 MG/ML SOLN
80.0000 mL | Freq: Once | INTRAVENOUS | Status: AC | PRN
  Administered 2024-01-30: 80 mL via INTRAVENOUS

## 2024-01-30 MED ORDER — PANTOPRAZOLE SODIUM 40 MG PO TBEC
40.0000 mg | DELAYED_RELEASE_TABLET | Freq: Every day | ORAL | Status: DC
  Administered 2024-01-31 – 2024-02-01 (×2): 40 mg via ORAL
  Filled 2024-01-30 (×2): qty 1

## 2024-01-30 MED ORDER — DULOXETINE HCL 60 MG PO CPEP
90.0000 mg | ORAL_CAPSULE | Freq: Every day | ORAL | Status: DC
  Administered 2024-01-31 – 2024-02-01 (×2): 90 mg via ORAL
  Filled 2024-01-30 (×2): qty 1

## 2024-01-30 NOTE — Progress Notes (Signed)
 Established Patient Office Visit  Subjective   Patient ID: Cindy Byrd, female    DOB: June 03, 1941  Age: 83 y.o. MRN: [id number]  Chief Complaint  Patient presents with   Hypotension    HPI Cindy Byrd is a 83 y.o. female presenting today initially for follow up of mood, but upon presentation concern was raised for other issues.  Patient states that she has not been feeling well for at least a week, she was also requesting to have her urine tested due to urinary symptoms.  Blood pressure was concerningly low all 3 times that it was checked in the office, oxygen saturation was at 87%.  Outpatient Medications Prior to Visit  Medication Sig   acetaminophen (TYLENOL) 325 MG tablet Take 2 tablets (650 mg total) by mouth every 6 (six) hours as needed for mild pain (or Fever >/= 101).   albuterol (VENTOLIN HFA) 108 (90 Base) MCG/ACT inhaler Inhale 2 puffs into the lungs every 6 (six) hours as needed for wheezing.   apixaban (ELIQUIS) 2.5 MG TABS tablet Take 1 tablet (2.5 mg total) by mouth 2 (two) times daily.   Budeson-Glycopyrrol-Formoterol (BREZTRI AEROSPHERE) 160-9-4.8 MCG/ACT AERO INHALE 2 PUFFS INTO THE LUNGS TWICE A DAY   buPROPion (WELLBUTRIN XL) 150 MG 24 hr tablet TAKE 1 TABLET BY MOUTH EVERY DAY   DULoxetine (CYMBALTA) 30 MG capsule Take 3 capsules (90 mg total) by mouth daily.   fentaNYL (DURAGESIC) 25 MCG/HR Place 1 patch onto the skin every 3 (three) days.   gabapentin (NEURONTIN) 600 MG tablet Take 1 tablet (600 mg total) by mouth 3 (three) times daily.   HYDROcodone-acetaminophen (NORCO) 5-325 MG tablet Take 1 tablet by mouth every 6 (six) hours as needed for moderate pain.   neomycin-polymyxin b-dexamethasone (MAXITROL) 3.5-10000-0.1 SUSP Place 1 drop into the right eye 4 (four) times daily.   omeprazole (PRILOSEC) 40 MG capsule Take 1 capsule (40 mg total) by mouth daily before breakfast.   ondansetron (ZOFRAN-ODT) 4 MG disintegrating tablet Take 1 tablet (4 mg total)  by mouth every 8 (eight) hours as needed for nausea or vomiting.   valACYclovir (VALTREX) 500 MG tablet TAKE 1 TABLET BY MOUTH TWICE A DAY   [DISCONTINUED] acidophilus (RISAQUAD) CAPS capsule Take 1 capsule by mouth daily. (Patient not taking: Reported on 01/30/2024)   [DISCONTINUED] cetirizine (ZYRTEC) 10 MG tablet TAKE 1 TABLET BY MOUTH EVERYDAY AT BEDTIME (Patient not taking: Reported on 01/30/2024)   [DISCONTINUED] feeding supplement (ENSURE ENLIVE / ENSURE PLUS) LIQD Take 237 mLs by mouth 2 (two) times daily between meals. (Patient not taking: Reported on 01/30/2024)   [DISCONTINUED] triamcinolone (KENALOG) 0.025 % cream Apply 1 Application topically 2 (two) times daily. (Patient not taking: Reported on 01/30/2024)   No facility-administered medications prior to visit.    ROS Negative unless otherwise noted in HPI   Objective:     BP (!) 64/37 Comment: right Provider informed of all of these  Pulse 92   Ht 5\' 8"  (1.727 m)   Wt 131 lb 12 oz (59.8 kg)   SpO2 (!) 87%   BMI 20.03 kg/m  Vitals:   01/30/24 1137 01/30/24 1138 01/30/24 1139  BP: (!) 81/49 (!) 76/41 (!) 64/37    Physical Exam Constitutional:      General: She is not in acute distress.    Appearance: Normal appearance.  HENT:     Head: Normocephalic and atraumatic.  Pulmonary:     Effort: Pulmonary effort is normal. No respiratory  distress.  Musculoskeletal:     Cervical back: Normal range of motion.  Neurological:     General: No focal deficit present.     Mental Status: She is alert and oriented to person, place, and time. Mental status is at baseline.  Psychiatric:        Mood and Affect: Mood normal.        Thought Content: Thought content normal.        Judgment: Judgment normal.     Assessment & Plan:  Hypotension, unspecified hypotension type  Low oxygen saturation  Due to feeling ill in combination with abnormal vital signs, recommend going to the ER for appropriate evaluation and management.   Assured her that ER provider will be able to perform the appropriate workup which may or may not include urinalysis, labs, and/or imaging studies.  Advised her to go as soon as possible and let them know everything that has been going on.  Patient verbalized understanding, planning on eating lunch and then going to [facility] ER.  No follow-ups on file. Reschedule mood follow up appointment as soon as possible.   Melida Quitter, PA

## 2024-01-30 NOTE — Progress Notes (Signed)
 PHARMACY - ANTICOAGULATION CONSULT NOTE  Pharmacy Consult for heparin Indication: pulmonary embolus  Allergies  Allergen Reactions   Penicillins Other (See Comments)    Unknown; childhood allergy - details unknown Tolerates Ancef   Remeron [Mirtazapine] Other (See Comments)    nightmares   Latex Rash    Patient Measurements:   Heparin Dosing Weight: 59.8 kg  Vital Signs: Temp: 97.5 F (36.4 C) (02/25 1719) Temp Source: Oral (02/25 1719) BP: 136/79 (02/25 1845) Pulse Rate: 81 (02/25 1845)  Labs: Recent Labs    01/30/24 1418  HGB 13.1  HCT 41.6  PLT 280  CREATININE 1.27*  TROPONINIHS 10    Estimated Creatinine Clearance: 31.1 mL/min (A) (by C-G formula based on SCr of 1.27 mg/dL (H)).   Medical History: Past Medical History:  Diagnosis Date   Barrett's esophagus    Bilateral pulmonary embolism (HCC) 09/02/2016   09/02/16 bilateral pulmonary emboli in context of extensive bilateral lower extremity deep venous thromboses Assumed hypercoagulability due to non-small cell metastatic lung cancer Lifelong anticoagulation recommended   Bone neoplasm 06/24/2015   Cancer (HCC)    metastatic poorly differentiated carcinoma. tumor left groin surgical removal with radiation tx.   Cataract    BILATERAL   Cigarette smoker two packs a day or less    Currently still smoking 2 PPD - Not interested in quitting at this time.   Colitis 2017   Colon polyps    hyperplastic, tubular adenomas, tubulovillous adenoma   Cough, persistent    hx. lung cancer ? primary-being evaluated, unsure of primary site.   Depression 06/24/2015   Diverticulosis    DVT of lower extremity, bilateral (HCC) 09/02/2016   Dysrhythmia    PAF in setting of submassive PE 09/2016   Emphysema of lung (HCC)    Endometriosis    Hysterectomy with BSO at age 86 yrs   Esophageal adenocarcinoma (HCC) 08/11/2015   intramucosal   Gastritis    GERD (gastroesophageal reflux disease)    H/O: pneumonia    Hiatal  hernia    Hyperglycemia    A1c 7.4% 06/20/22   Hyperlipidemia    Hypertension 06/24/2015   likely improved incidental to 40 lbs weight loss from her neoplasm. No Longer taking med for this as of 08-06-15   IBS (irritable bowel syndrome)    Lung cancer (HCC)    suspected IV lung cancer with left ileum mets 2017, s/p left ileum radiaiton, immunotherapy   Pain    left hip-persistent"tumor of bone"-radiation tx. 10.   Pneumonia    Vitamin D deficiency disease       Assessment: 83 year old female presented from PCP office with hypoxia and hypotension. Patient with history of PE on Eliquis 2.5 mg BID. Patient reports it has been a week since she has taken her Eliquis. She has history of lung cancer no longer on treatment. CTA chest with acute right lower lobe segmental PE, minimal clot burden and no evidence of right heart strain. Pharmacy consulted for heparin management.  Goal of Therapy:  Heparin level 0.3-0.7 units/ml Monitor platelets by anticoagulation protocol: Yes   Plan:  -Heparin 3500 unit bolus -Heparin infusion at 1000 units/hr -Check 8 hour aPTT/HL - expect will be able to follow heparin level alone given pt reported Eliquis one week ago -Daily CBC  Pricilla Riffle, PharmD, BCPS Clinical Pharmacist 01/30/2024 8:13 PM

## 2024-01-30 NOTE — ED Provider Notes (Signed)
 [facility] Provider Note   CSN: [id number] Arrival date & time: 01/30/24  1239     History  Chief Complaint  Patient presents with   Fatigue   Dysuria    Cindy Byrd is a 83 y.o. female.   Dysuria Patient presents for multiple complaints.  Medical history includes HLD, depression, HTN, metastatic lung cancer, anxiety, COPD, PE.  She is no longer on treatment for her lung cancer.  She does follow with oncology for surveillance.  Typically, she uses 3 L of oxygen at baseline.  She does not do breathing treatments at home.  When she feels short of breath, she will simply rest.  Although there was concern about broken oxygen concentrator, she states that she has oxygen at home but simply does not have the portable device that she typically has.  She was seen by PCP earlier today for routine visit.  While at PCPs office, she was found to be hypotensive and hypoxic on room air.  Patient denies use of blood pressure medications.  She was sent to the ED for further evaluation.  Patient endorses recent fatigue, shortness of breath, cough productive of green sputum, dysuria, and bilateral lower back pain described as posterior hips.  This pain is worsened with ambulation.  She denies any recent injuries.       Home Medications Prior to Admission medications   Medication Sig Start Date End Date Taking? Authorizing Provider  acetaminophen (TYLENOL) 325 MG tablet Take 2 tablets (650 mg total) by mouth every 6 (six) hours as needed for mild pain (or Fever >/= 101). 09/12/22   Sheikh, Kateri Mc Latif, DO  albuterol (VENTOLIN HFA) 108 (90 Base) MCG/ACT inhaler Inhale 2 puffs into the lungs every 6 (six) hours as needed for wheezing. 03/14/23   Melida Quitter, PA  apixaban (ELIQUIS) 2.5 MG TABS tablet Take 1 tablet (2.5 mg total) by mouth 2 (two) times daily. 05/07/23   Johney Maine, MD  Budeson-Glycopyrrol-Formoterol (BREZTRI AEROSPHERE) 160-9-4.8  MCG/ACT AERO INHALE 2 PUFFS INTO THE LUNGS TWICE A DAY 10/24/23   Saralyn Pilar A, PA  buPROPion (WELLBUTRIN XL) 150 MG 24 hr tablet TAKE 1 TABLET BY MOUTH EVERY DAY 10/17/22   Abonza, Maritza, PA-C  DULoxetine (CYMBALTA) 30 MG capsule Take 3 capsules (90 mg total) by mouth daily. 10/24/23   Melida Quitter, PA  fentaNYL (DURAGESIC) 25 MCG/HR Place 1 patch onto the skin every 3 (three) days. 01/10/24   Johney Maine, MD  gabapentin (NEURONTIN) 600 MG tablet Take 1 tablet (600 mg total) by mouth 3 (three) times daily. 10/24/23   Melida Quitter, PA  HYDROcodone-acetaminophen (NORCO) 5-325 MG tablet Take 1 tablet by mouth every 6 (six) hours as needed for moderate pain. 04/14/23   Johney Maine, MD  neomycin-polymyxin b-dexamethasone (MAXITROL) 3.5-10000-0.1 SUSP Place 1 drop into the right eye 4 (four) times daily. 03/30/23   [provider]  omeprazole (PRILOSEC) 40 MG capsule Take 1 capsule (40 mg total) by mouth daily before breakfast. 01/10/24   Johney Maine, MD  ondansetron (ZOFRAN-ODT) 4 MG disintegrating tablet Take 1 tablet (4 mg total) by mouth every 8 (eight) hours as needed for nausea or vomiting. 05/29/23   Melida Quitter, PA  valACYclovir (VALTREX) 500 MG tablet TAKE 1 TABLET BY MOUTH TWICE A DAY 09/12/22   Johney Maine, MD      Allergies    Penicillins, Remeron [mirtazapine], and Latex  Review of Systems   Review of Systems  Constitutional:  Positive for fatigue.  Respiratory:  Positive for cough and shortness of breath.   Cardiovascular:  Positive for chest pain.  Genitourinary:  Positive for dysuria.  Musculoskeletal:  Positive for arthralgias and back pain.  All other systems reviewed and are negative.   Physical Exam Updated Vital Signs BP 136/79   Pulse 81   Temp (!) 97.5 F (36.4 C) (Oral)   Resp 18   SpO2 98%  Physical Exam Vitals and nursing note reviewed.  Constitutional:      General: She is not in acute distress.     Appearance: Normal appearance. She is well-developed. She is not ill-appearing, toxic-appearing or diaphoretic.  HENT:     Head: Normocephalic and atraumatic.     Right Ear: External ear normal.     Left Ear: External ear normal.     Nose: Nose normal.  Eyes:     Extraocular Movements: Extraocular movements intact.     Conjunctiva/sclera: Conjunctivae normal.  Cardiovascular:     Rate and Rhythm: Normal rate and regular rhythm.     Heart sounds: No murmur heard. Pulmonary:     Effort: Pulmonary effort is normal. No respiratory distress.     Breath sounds: Normal breath sounds. No wheezing, rhonchi or rales.  Chest:     Chest wall: No tenderness.  Abdominal:     General: There is no distension.     Palpations: Abdomen is soft.     Tenderness: There is no abdominal tenderness.  Musculoskeletal:        General: No swelling.     Cervical back: Normal range of motion and neck supple.  Skin:    General: Skin is warm and dry.     Coloration: Skin is not jaundiced or pale.  Neurological:     General: No focal deficit present.     Mental Status: She is alert and oriented to person, place, and time.  Psychiatric:        Mood and Affect: Mood normal.        Behavior: Behavior normal.     ED Results / Procedures / Treatments   Labs (all labs ordered are listed, but only abnormal results are displayed) Labs Reviewed  URINALYSIS, ROUTINE W REFLEX MICROSCOPIC - Abnormal; Notable for the following components:      Result Value   Color, Urine AMBER (*)    APPearance HAZY (*)    Bilirubin Urine SMALL (*)    Protein, ur 30 (*)    Leukocytes,Ua TRACE (*)    Bacteria, UA RARE (*)    All other components within normal limits  COMPREHENSIVE METABOLIC PANEL - Abnormal; Notable for the following components:   Glucose, Bld 122 (*)    Creatinine, Ser 1.27 (*)    Calcium 8.8 (*)    Albumin 3.0 (*)    GFR, Estimated 42 (*)    All other components within normal limits  BRAIN NATRIURETIC  PEPTIDE - Abnormal; Notable for the following components:   B Natriuretic Peptide 140.7 (*)    All other components within normal limits  URINE CULTURE  CBC WITH DIFFERENTIAL/PLATELET  TSH  TROPONIN I (HIGH SENSITIVITY)  TROPONIN I (HIGH SENSITIVITY)    EKG EKG Interpretation Date/Time:  Tuesday January 30 2024 13:50:57 EST Ventricular Rate:  80 PR Interval:  193 QRS Duration:  84 QT Interval:  386 QTC Calculation: 446 R Axis:   43  Text Interpretation: Sinus rhythm  Low voltage, precordial leads Confirmed by Gloris Manchester [phone]) on 01/30/2024 5:18:44 PM  Radiology CT Angio Chest PE W and/or Wo Contrast Result Date: 01/30/2024 CLINICAL DATA:  Non-small cell lung cancer, esophageal cancer, history of skeletal metastases, hypoxia, hypotension, malaise, decreased urination EXAM: CT ANGIOGRAPHY CHEST CT ABDOMEN AND PELVIS WITH CONTRAST TECHNIQUE: Multidetector CT imaging of the chest was performed using the standard protocol during bolus administration of intravenous contrast. Multiplanar CT image reconstructions and MIPs were obtained to evaluate the vascular anatomy. Multidetector CT imaging of the abdomen and pelvis was performed using the standard protocol during bolus administration of intravenous contrast. RADIATION DOSE REDUCTION: This exam was performed according to the departmental dose-optimization program which includes automated exposure control, adjustment of the mA and/or kV according to patient size and/or use of iterative reconstruction technique. CONTRAST:  80mL OMNIPAQUE IOHEXOL 350 MG/ML SOLN COMPARISON:  01/23/2024, 09/19/2023, 05/04/2023 FINDINGS: CTA CHEST FINDINGS Cardiovascular: This is a technically adequate evaluation of the pulmonary vasculature. Stable sequela of chronic thromboembolic disease within synechia in the right upper lobe, and nonocclusive the herein clot within the proximal left upper and left lower lobe pulmonary arteries, without change since prior study.  There are acute segmental right lower lobe pulmonary emboli identified on this exam, with minimal clot burden. Stable enlarged main pulmonary artery consistent with chronic pulmonary arterial hypertension. The heart is unremarkable without pericardial effusion. Normal caliber of the thoracic aorta. Stable atherosclerosis of the aorta, with significant plaque involving the distal aortic arch without critical stenosis. No evidence of dissection. Right chest wall port via internal jugular approach, tip within the superior vena cava. Mediastinum/Nodes: No enlarged mediastinal, hilar, or axillary lymph nodes. Thyroid gland, trachea, and esophagus demonstrate no significant findings. Lungs/Pleura: Stable emphysema. No acute airspace disease, effusion, or pneumothorax. Mild bilateral bronchial wall thickening, greatest in the lower lobes. Fluid-filled segmental left lower lobe bronchi are identified, which may be due to retained secretions or aspiration. Since the 09/19/2023 exam, a 10 x 8 mm left upper lobe pulmonary nodule has developed, reference image 69/13. Musculoskeletal: Multiple prior healed left rib fractures are noted. There are no acute or destructive bony abnormalities. Reconstructed images demonstrate no additional findings. Review of the MIP images confirms the above findings. CT ABDOMEN and PELVIS FINDINGS Hepatobiliary: Multiple small hypodensities are again seen throughout the liver, unchanged, and most consistent with cysts or hemangiomas. Multiple small gallstones without cholecystitis. No biliary duct dilation. Pancreas: Unremarkable. No pancreatic ductal dilatation or surrounding inflammatory changes. Spleen: Stable splenic atrophy with areas of capsular contraction and scarring. Adrenals/Urinary Tract: Adrenal glands are unremarkable. Kidneys are normal, without renal calculi, focal lesion, or hydronephrosis. The bladder is only minimally distended, limiting its evaluation. There is mild  perivesicular fat stranding, and underlying cystitis cannot be excluded. Stomach/Bowel: No bowel obstruction or ileus. Normal appendix right lower quadrant. Diverticulosis of the sigmoid colon without evidence of acute diverticulitis. No bowel wall thickening or inflammatory change. Vascular/Lymphatic: Aortic atherosclerosis. No enlarged abdominal or pelvic lymph nodes. Reproductive: Status post hysterectomy. No adnexal masses. Other: No free fluid or free intraperitoneal gas. No abdominal wall hernia. 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 No acute displaced fracture. Reconstructed images demonstrate no additional findings. Review of the MIP images confirms the above findings. IMPRESSION: Chest: 1. Acute right lower lobe segmental pulmonary emboli. Minimal clot burden with no evidence of right heart strain. 2. Stable sequela of chronic pulmonary thromboembolic disease, with thin synechia in the right upper lobe  and chronic adherent nonocclusive thrombus within the left upper and left lower lobe pulmonary arteries origins. 3. Mild bilateral bronchial wall thickening, greatest in the lower lobes. Fluid-filled segmental left lower lobe bronchi. Findings likely reflect bronchitis and retained secretions. 4. Aortic Atherosclerosis (ICD10-I70.0) and Emphysema (ICD10-J43.9). Abdomen/pelvis: 1. 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 2. No other signs of intra-abdominal or intrapelvic metastases. 3. Sigmoid diverticulosis without diverticulitis. 4. Cholelithiasis without cholecystitis. 5.  Aortic Atherosclerosis (ICD10-I70.0). Critical Value/emergent results were called by telephone at the time of interpretation on 01/30/2024 at 7:22 pm to provider [facility] , who verbally acknowledged these results. Electronically Signed   By: Sharlet Salina M.D.   On: 01/30/2024 19:34   CT ABDOMEN PELVIS W CONTRAST Result Date:  01/30/2024 CLINICAL DATA:  Non-small cell lung cancer, esophageal cancer, history of skeletal metastases, hypoxia, hypotension, malaise, decreased urination EXAM: CT ANGIOGRAPHY CHEST CT ABDOMEN AND PELVIS WITH CONTRAST TECHNIQUE: Multidetector CT imaging of the chest was performed using the standard protocol during bolus administration of intravenous contrast. Multiplanar CT image reconstructions and MIPs were obtained to evaluate the vascular anatomy. Multidetector CT imaging of the abdomen and pelvis was performed using the standard protocol during bolus administration of intravenous contrast. RADIATION DOSE REDUCTION: This exam was performed according to the departmental dose-optimization program which includes automated exposure control, adjustment of the mA and/or kV according to patient size and/or use of iterative reconstruction technique. CONTRAST:  80mL OMNIPAQUE IOHEXOL 350 MG/ML SOLN COMPARISON:  01/23/2024, 09/19/2023, 05/04/2023 FINDINGS: CTA CHEST FINDINGS Cardiovascular: This is a technically adequate evaluation of the pulmonary vasculature. Stable sequela of chronic thromboembolic disease within synechia in the right upper lobe, and nonocclusive the herein clot within the proximal left upper and left lower lobe pulmonary arteries, without change since prior study. There are acute segmental right lower lobe pulmonary emboli identified on this exam, with minimal clot burden. Stable enlarged main pulmonary artery consistent with chronic pulmonary arterial hypertension. The heart is unremarkable without pericardial effusion. Normal caliber of the thoracic aorta. Stable atherosclerosis of the aorta, with significant plaque involving the distal aortic arch without critical stenosis. No evidence of dissection. Right chest wall port via internal jugular approach, tip within the superior vena cava. Mediastinum/Nodes: No enlarged mediastinal, hilar, or axillary lymph nodes. Thyroid gland, trachea, and  esophagus demonstrate no significant findings. Lungs/Pleura: Stable emphysema. No acute airspace disease, effusion, or pneumothorax. Mild bilateral bronchial wall thickening, greatest in the lower lobes. Fluid-filled segmental left lower lobe bronchi are identified, which may be due to retained secretions or aspiration. Since the 09/19/2023 exam, a 10 x 8 mm left upper lobe pulmonary nodule has developed, reference image 69/13. Musculoskeletal: Multiple prior healed left rib fractures are noted. There are no acute or destructive bony abnormalities. Reconstructed images demonstrate no additional findings. Review of the MIP images confirms the above findings. CT ABDOMEN and PELVIS FINDINGS Hepatobiliary: Multiple small hypodensities are again seen throughout the liver, unchanged, and most consistent with cysts or hemangiomas. Multiple small gallstones without cholecystitis. No biliary duct dilation. Pancreas: Unremarkable. No pancreatic ductal dilatation or surrounding inflammatory changes. Spleen: Stable splenic atrophy with areas of capsular contraction and scarring. Adrenals/Urinary Tract: Adrenal glands are unremarkable. Kidneys are normal, without renal calculi, focal lesion, or hydronephrosis. The bladder is only minimally distended, limiting its evaluation. There is mild perivesicular fat stranding, and underlying cystitis cannot be excluded. Stomach/Bowel: No bowel obstruction or ileus. Normal appendix right lower quadrant. Diverticulosis of the sigmoid colon  without evidence of acute diverticulitis. No bowel wall thickening or inflammatory change. Vascular/Lymphatic: Aortic atherosclerosis. No enlarged abdominal or pelvic lymph nodes. Reproductive: Status post hysterectomy. No adnexal masses. Other: No free fluid or free intraperitoneal gas. No abdominal wall hernia. 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 No acute displaced fracture. Reconstructed  images demonstrate no additional findings. Review of the MIP images confirms the above findings. IMPRESSION: Chest: 1. Acute right lower lobe segmental pulmonary emboli. Minimal clot burden with no evidence of right heart strain. 2. Stable sequela of chronic pulmonary thromboembolic disease, with thin synechia in the right upper lobe and chronic adherent nonocclusive thrombus within the left upper and left lower lobe pulmonary arteries origins. 3. Mild bilateral bronchial wall thickening, greatest in the lower lobes. Fluid-filled segmental left lower lobe bronchi. Findings likely reflect bronchitis and retained secretions. 4. Aortic Atherosclerosis (ICD10-I70.0) and Emphysema (ICD10-J43.9). Abdomen/pelvis: 1. 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 2. No other signs of intra-abdominal or intrapelvic metastases. 3. Sigmoid diverticulosis without diverticulitis. 4. Cholelithiasis without cholecystitis. 5.  Aortic Atherosclerosis (ICD10-I70.0). Critical Value/emergent results were called by telephone at the time of interpretation on 01/30/2024 at 7:22 pm to provider [facility] , who verbally acknowledged these results. Electronically Signed   By: Sharlet Salina M.D.   On: 01/30/2024 19:34   CT T-SPINE NO CHARGE Result Date: 01/30/2024 CLINICAL DATA:  Metastatic lung cancer, hypoxia, back pain for 2 weeks EXAM: CT Thoracic and Lumbar spine with contrast TECHNIQUE: Multiplanar CT images of the thoracic and lumbar spine were reconstructed from contemporary CT of the Chest, Abdomen, and Pelvis. RADIATION DOSE REDUCTION: This exam was performed according to the departmental dose-optimization program which includes automated exposure control, adjustment of the mA and/or kV according to patient size and/or use of iterative reconstruction technique. CONTRAST:  No additional COMPARISON:  09/21/2023, 09/19/2023, 06/15/2023 FINDINGS: CT THORACIC SPINE FINDINGS Alignment: Alignment is  anatomic. Vertebrae: There are no acute displaced fractures. No destructive bony lesions. Paraspinal and other soft tissues: The paraspinal soft tissues are unremarkable. Please refer to separately reported chest CT exam describing acute and chronic pulmonary emboli. Disc levels: There is mild diffuse thoracic spondylosis. No bony encroachment upon the central canal or neural foramina. CT LUMBAR SPINE FINDINGS Segmentation: 5 lumbar type vertebrae. Alignment: Right convex scoliosis centered at L3. Otherwise alignment is anatomic. Vertebrae: There are no acute fractures or destructive lesions within the lumbar spine. The mixed lytic and sclerotic metastatic lesion within the left iliac bone with sequela of chronic pathologic fracture is unchanged. Paraspinal and other soft tissues: The paraspinal soft tissues are unremarkable. Please refer to the separately reported abdomen and pelvic CT for findings in that region. Disc levels: There is circumferential disc bulge bilateral facet hypertrophy at L2-3, L3-4, and L4-5, with mild central canal stenosis. The neural foramina are grossly patent at L2-3 and L3-4, with left predominant neural foraminal narrowing at L4-5. Reconstructed images demonstrate no additional findings. IMPRESSION: 1. No acute or destructive lesions within the thoracolumbar spine. 2. Mild diffuse thoracic spondylosis without compressive sequela. 3. Multilevel lumbar spondylosis and facet hypertrophy, most pronounced from L2-3 through L4-5. 4. Mixed sclerotic and lytic lesion within the left iliac bone again noted, compatible with known bony metastases. 5. Please refer to separately reported CT chest, abdomen, and pelvis exams for important findings in those regions. Electronically Signed   By: Sharlet Salina M.D.   On: 01/30/2024 19:27   CT  L-SPINE NO CHARGE Result Date: 01/30/2024 CLINICAL DATA:  Metastatic lung cancer, hypoxia, back pain for 2 weeks EXAM: CT Thoracic and Lumbar spine with contrast  TECHNIQUE: Multiplanar CT images of the thoracic and lumbar spine were reconstructed from contemporary CT of the Chest, Abdomen, and Pelvis. RADIATION DOSE REDUCTION: This exam was performed according to the departmental dose-optimization program which includes automated exposure control, adjustment of the mA and/or kV according to patient size and/or use of iterative reconstruction technique. CONTRAST:  No additional COMPARISON:  09/21/2023, 09/19/2023, 06/15/2023 FINDINGS: CT THORACIC SPINE FINDINGS Alignment: Alignment is anatomic. Vertebrae: There are no acute displaced fractures. No destructive bony lesions. Paraspinal and other soft tissues: The paraspinal soft tissues are unremarkable. Please refer to separately reported chest CT exam describing acute and chronic pulmonary emboli. Disc levels: There is mild diffuse thoracic spondylosis. No bony encroachment upon the central canal or neural foramina. CT LUMBAR SPINE FINDINGS Segmentation: 5 lumbar type vertebrae. Alignment: Right convex scoliosis centered at L3. Otherwise alignment is anatomic. Vertebrae: There are no acute fractures or destructive lesions within the lumbar spine. The mixed lytic and sclerotic metastatic lesion within the left iliac bone with sequela of chronic pathologic fracture is unchanged. Paraspinal and other soft tissues: The paraspinal soft tissues are unremarkable. Please refer to the separately reported abdomen and pelvic CT for findings in that region. Disc levels: There is circumferential disc bulge bilateral facet hypertrophy at L2-3, L3-4, and L4-5, with mild central canal stenosis. The neural foramina are grossly patent at L2-3 and L3-4, with left predominant neural foraminal narrowing at L4-5. Reconstructed images demonstrate no additional findings. IMPRESSION: 1. No acute or destructive lesions within the thoracolumbar spine. 2. Mild diffuse thoracic spondylosis without compressive sequela. 3. Multilevel lumbar spondylosis and  facet hypertrophy, most pronounced from L2-3 through L4-5. 4. Mixed sclerotic and lytic lesion within the left iliac bone again noted, compatible with known bony metastases. 5. Please refer to separately reported CT chest, abdomen, and pelvis exams for important findings in those regions. Electronically Signed   By: Sharlet Salina M.D.   On: 01/30/2024 19:27   DG Chest Portable 1 View Result Date: 01/30/2024 CLINICAL DATA:  Cough, congestion, weakness. EXAM: PORTABLE CHEST 1 VIEW COMPARISON:  09/10/2022. FINDINGS: There are stable linear opacities scattered throughout bilateral lungs, which may represent scarring/atelectasis. Bilateral lung fields are otherwise clear. Bilateral costophrenic angles are clear. Normal cardio-mediastinal silhouette. No acute osseous abnormalities. The soft tissues are within normal limits. Right-sided CT Port-A-Cath is seen with its tip overlying the midportion of superior vena cava, unchanged. IMPRESSION: No active disease. Electronically Signed   By: Jules Schick M.D.   On: 01/30/2024 15:07    Procedures Procedures    Medications Ordered in ED Medications  cefTRIAXone (ROCEPHIN) 1 g in sodium chloride 0.9 % 100 mL IVPB (has no administration in time range)  oxyCODONE-acetaminophen (PERCOCET/ROXICET) 5-325 MG per tablet 1 tablet (1 tablet Oral Given 01/30/24 1731)  lactated ringers bolus 1,000 mL (1,000 mLs Intravenous New Bag/Given 01/30/24 1842)  iohexol (OMNIPAQUE) 350 MG/ML injection 80 mL (80 mLs Intravenous Contrast Given 01/30/24 1826)    ED Course/ Medical Decision Making/ A&P                                 Medical Decision Making Amount and/or Complexity of Data Reviewed Labs: ordered. Radiology: ordered.  Risk Prescription drug management.   This patient presents to the ED for  concern of multiple complaints, this involves an extensive number of treatment options, and is a complaint that carries with it a high risk of complications and morbidity.   The differential diagnosis includes infection, deconditioning, dehydration, polypharmacy, COPD exacerbation, PE   Co morbidities that complicate the patient evaluation  HLD, depression, HTN, metastatic lung cancer, anxiety, COPD, PE   Additional history obtained:  Additional history obtained from patient's family External records from outside source obtained and reviewed including EMR   Lab Tests:  I Ordered, and personally interpreted labs.  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 I agree with the radiologist interpretation   Cardiac Monitoring: / EKG:  The patient was maintained on a cardiac monitor.  I personally viewed and interpreted the cardiac monitored which showed an underlying rhythm of: Sinus rhythm  Problem List / ED Course / Critical interventions / Medication management  Patient presenting for multiple complaints.  Notably, she was seen by PCP earlier today where she was found to be hypoxic and hypotensive.  On arrival in the ED, she is normotensive.  SpO2 is normal on her baseline 3 L of oxygen.  Current breathing is unlabored.  She is able to speak in complete sentences.  No wheezing or rhonchi appreciated on lung auscultation.  Patient endorses current pain in bilateral lower back.  Percocet was ordered for analgesia.  Broad workup  was initiated.  Urinalysis showed trace bacteria and pyuria.  Given her recent symptoms, patient was treated empirically for UTI.  Urine cultures were ordered.  Imaging studies showed acute right lower lobe segmental PE.  This may be a failure of her home Eliquis.  Heparin was ordered.  There was some lytic and sclerotic osseous lesions on left iliac bone.  These were identified on prior imaging studies.  This would likely explain her low back/posterior hip pain.  Pain did improve following Percocet.  Patient was agreeable to admission. I ordered medication including Percocet for analgesia; IV fluids for hydration; ceftriaxone for UTI; heparin for PE Reevaluation of the patient after these medicines showed that the patient improved I have reviewed the patients home medicines and have made adjustments as needed   Social Determinants of Health:  Lives at home with family  CRITICAL CARE Performed by: Gloris Manchester   Total critical care time: 32 minutes  Critical care time was exclusive of separately billable procedures and treating other patients.  Critical care was necessary to treat or prevent imminent or life-threatening deterioration.  Critical care was time spent personally by me on the following activities: development of treatment plan with patient and/or surrogate as well as nursing, discussions with consultants, evaluation of patient's response to treatment, examination of patient, obtaining history from patient or surrogate, ordering and performing treatments and interventions, ordering and review of laboratory studies, ordering and review of radiographic studies, pulse oximetry and re-evaluation of patient's condition.        Final Clinical Impression(s) / ED Diagnoses Final  diagnoses:  Acute pulmonary embolism, unspecified pulmonary embolism type, unspecified whether acute cor pulmonale present [facility])  Cystitis    Rx / DC Orders ED Discharge Orders     None          Gloris Manchester, MD 01/30/24 1954

## 2024-01-30 NOTE — ED Triage Notes (Addendum)
 Patient presents due to low SPO2 , low BP and malaise the last 2 weeks. She is concerned that she may have a UTI. She does not believe she is urinating as often as she should. She reports urinating 1-2 times a day and it is a little painful. She also complains of worsening back pain that started about 2 weeks ago.   Chronic 3 L O2 use at home, but has not had her O2 recently.

## 2024-01-30 NOTE — ED Provider Triage Note (Signed)
 Emergency Medicine Provider Triage Evaluation Note  Cindy Byrd , a 84 y.o. female  was evaluated in triage.  Pt complains of fatigue and dysuria.  Patient reports noticing feeling increasing generalized fatigue for about 3 weeks.  Is also had dysuria for the last week or so.  Endorses urinating 1-2 times per day with pain.  Denies any blood in her urine as far she has noted.  Patient does use 3 L via [place] at home but has recently stopped using her home oxygen as her concentrator has broken.  Denies any significant chest pain, wheezing, or chest tightness.  Does endorse that she was being treated for a suspected chest infection with antibiotics but states that her symptoms not improved after 2 or 3 weeks.  Still producing a green discolored sputum.  Review of Systems  Positive: As above Negative: As above  Physical Exam  BP (!) 98/53   Pulse 82   Temp 98.5 F (36.9 C) (Oral)   Resp 16   SpO2 100%  Gen:   Awake, no distress   Resp:  Normal effort  MSK:   Moves extremities without difficulty  Other:    Medical Decision Making  Medically screening exam initiated at 1:34 PM.  Appropriate orders placed.  [name] was informed that the remainder of the evaluation will be completed by another provider, this initial triage assessment does not replace that evaluation, and the importance of remaining in the ED until their evaluation is complete.     Smitty Knudsen, PA-C 01/30/24 1335

## 2024-01-30 NOTE — ED Notes (Signed)
 Pt to CT

## 2024-01-31 ENCOUNTER — Other Ambulatory Visit (HOSPITAL_COMMUNITY): Payer: Self-pay

## 2024-01-31 ENCOUNTER — Inpatient Hospital Stay (HOSPITAL_COMMUNITY): Payer: Medicare Other

## 2024-01-31 DIAGNOSIS — I2693 Single subsegmental pulmonary embolism without acute cor pulmonale: Secondary | ICD-10-CM

## 2024-01-31 DIAGNOSIS — M79661 Pain in right lower leg: Secondary | ICD-10-CM | POA: Diagnosis not present

## 2024-01-31 DIAGNOSIS — I2699 Other pulmonary embolism without acute cor pulmonale: Secondary | ICD-10-CM | POA: Diagnosis not present

## 2024-01-31 LAB — BASIC METABOLIC PANEL
Anion gap: 8 (ref 5–15)
BUN: 20 mg/dL (ref 8–23)
CO2: 26 mmol/L (ref 22–32)
Calcium: 7.9 mg/dL — ABNORMAL LOW (ref 8.9–10.3)
Chloride: 103 mmol/L (ref 98–111)
Creatinine, Ser: 1.1 mg/dL — ABNORMAL HIGH (ref 0.44–1.00)
GFR, Estimated: 50 mL/min — ABNORMAL LOW (ref >60)
Glucose, Bld: 96 mg/dL (ref 70–99)
Potassium: 4.6 mmol/L (ref 3.5–5.1)
Sodium: 137 mmol/L (ref 135–145)

## 2024-01-31 LAB — ECHOCARDIOGRAM COMPLETE
AR max vel: 2.48 cm2
AV Area VTI: 3.41 cm2
AV Area mean vel: 2.71 cm2
AV Mean grad: 3 mm[Hg]
AV Peak grad: 6.4 mm[Hg]
Ao pk vel: 1.26 m/s
Area-P 1/2: 1.94 cm2
Calc EF: 66 %
Height: 68 in
MV VTI: 2.83 cm2
S' Lateral: 2.8 cm
Single Plane A2C EF: 68.2 %
Single Plane A4C EF: 64.4 %
Weight: 2108.8 [oz_av]

## 2024-01-31 LAB — APTT
aPTT: >200 s (ref 24–36)
aPTT: 121 s — ABNORMAL HIGH (ref 24–36)

## 2024-01-31 LAB — HEPARIN LEVEL (UNFRACTIONATED)
Heparin Unfractionated: >1.1 [IU]/mL — ABNORMAL HIGH (ref 0.30–0.70)
Heparin Unfractionated: 0.65 [IU]/mL (ref 0.30–0.70)
Heparin Unfractionated: >1.1 [IU]/mL — ABNORMAL HIGH (ref 0.30–0.70)

## 2024-01-31 LAB — CBC
HCT: 40.8 % (ref 36.0–46.0)
Hemoglobin: 13.1 g/dL (ref 12.0–15.0)
MCH: 31.2 pg (ref 26.0–34.0)
MCHC: 32.1 g/dL (ref 30.0–36.0)
MCV: 97.1 fL (ref 80.0–100.0)
Platelets: 257 10*3/uL (ref 150–400)
RBC: 4.2 MIL/uL (ref 3.87–5.11)
RDW: 15.3 % (ref 11.5–15.5)
WBC: 12 10*3/uL — ABNORMAL HIGH (ref 4.0–10.5)
nRBC: 0 % (ref 0.0–0.2)

## 2024-01-31 MED ORDER — UMECLIDINIUM BROMIDE 62.5 MCG/ACT IN AEPB
1.0000 | INHALATION_SPRAY | Freq: Every day | RESPIRATORY_TRACT | Status: DC
  Administered 2024-01-31 – 2024-02-01 (×2): 1 via RESPIRATORY_TRACT
  Filled 2024-01-31: qty 7

## 2024-01-31 MED ORDER — SODIUM CHLORIDE 0.9 % IV SOLN
1.0000 g | INTRAVENOUS | Status: DC
  Administered 2024-01-31: 1 g via INTRAVENOUS
  Filled 2024-01-31: qty 10

## 2024-01-31 MED ORDER — APIXABAN 5 MG PO TABS
10.0000 mg | ORAL_TABLET | Freq: Two times a day (BID) | ORAL | Status: DC
  Administered 2024-01-31 – 2024-02-01 (×2): 10 mg via ORAL
  Filled 2024-01-31 (×2): qty 2

## 2024-01-31 MED ORDER — HEPARIN (PORCINE) 25000 UT/250ML-% IV SOLN: 800.0000 [IU]/h | INTRAVENOUS | Status: DC

## 2024-01-31 MED ORDER — LACTATED RINGERS IV SOLN: INTRAVENOUS | Status: AC

## 2024-01-31 MED ORDER — APIXABAN 5 MG PO TABS: 5.0000 mg | ORAL_TABLET | Freq: Two times a day (BID) | ORAL | Status: DC

## 2024-01-31 MED ORDER — FLUTICASONE FUROATE-VILANTEROL 100-25 MCG/ACT IN AEPB
1.0000 | INHALATION_SPRAY | Freq: Every day | RESPIRATORY_TRACT | Status: DC
  Administered 2024-01-31 – 2024-02-01 (×2): 1 via RESPIRATORY_TRACT
  Filled 2024-01-31: qty 28

## 2024-01-31 NOTE — Progress Notes (Addendum)
 Pharmacy: [facility]  Patient is an 83 y.o F with hx metastatic lung cancer and DVT/PE who was instructed by her PCP to come to the ED on 01/29/25 for further workup of hypoxia and hypotension.  Of note, she was placed on Eliquis for hx VTE, but reported not filling her prescription since last year. Chest CT on 01/30/24 showed acute right lower lobe segmental PE with minimal clot burden.   She is currently on heparin drip for VTE treatment.   - heparin level collected at 3pm is supra-therapeutic at >1.10. - per pt's RN, no bleeding noted  - cbc ok  Goal of Therapy:  Heparin level 0.3-0.7 units/ml Monitor platelets by anticoagulation protocol: Yes  Plan: - hold heparin drip for 1 hr, then resume drip back at 800 units/hr - check 8 hr heparin level - monitor for s/sx bleeding   Dorna Leitz, PharmD, BCPS 01/31/2024 4:03 PM   __________________________________________ Adden: Per Dr. Elvera Lennox via [place] msg. ok  to change anticoag. to Eliquis. - stop heparin drip - start Eliquis 10 mg bid x7 days, then 5mg  bid  Dorna Leitz, PharmD, BCPS 01/31/2024 4:39 PM

## 2024-01-31 NOTE — TOC Initial Note (Signed)
 Transition of Care [facility]) - Initial/Assessment Note    Patient Details  Name: Cindy Byrd MRN: [id number] Date of Birth: 05/09/1941  [facility] CM/SW Contact:    Lanier Clam, RN Phone Number: 01/31/2024, 4:15 PM  Clinical Narrative: Patient d/c plan home. Await PT recc.Has [employer] home 02-has travel tank. Has own transport home.                  Expected Discharge Plan: Home/Self Care Barriers to Discharge: Continued Medical Work up   Patient Goals and CMS Choice Patient states their goals for this hospitalization and ongoing recovery are:: Home CMS Medicare.gov Compare Post Acute Care list provided to:: Patient Choice offered to / list presented to : Patient [place] ownership interest in [facility].provided to:: Patient    Expected Discharge Plan and Services   Discharge Planning Services: CM Consult   Living arrangements for the past 2 months: Single Family Home                                      Prior Living Arrangements/Services Living arrangements for the past 2 months: Single Family Home Lives with:: Self              Current home services: DME (rw,cane,02-[employer])    Activities of Daily Living   ADL Screening (condition at time of admission) Independently performs ADLs?: Yes (appropriate for developmental age) Is the patient deaf or have difficulty hearing?: No Does the patient have difficulty seeing, even when wearing glasses/contacts?: No Does the patient have difficulty concentrating, remembering, or making decisions?: No  Permission Sought/Granted                  Emotional Assessment              Admission diagnosis:  Acute pulmonary embolism (HCC) [I26.99] Cystitis [N30.90] Acute pulmonary embolism, unspecified pulmonary embolism type, unspecified whether acute cor pulmonale present (HCC) [I26.99] Patient Active Problem List   Diagnosis Date Noted   Acute pulmonary embolism (HCC) 01/30/2024    Chronic respiratory failure with hypoxia (HCC) 10/04/2023   Lung mass 05/23/2023   Cystitis 09/04/2022   Herpes zoster ophthalmicus of right eye 09/04/2022   Centrilobular emphysema (HCC) 06/20/2022   Esophageal dysphagia    DOE (dyspnea on exertion) 04/15/2019   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) 01/14/2019   Counseling regarding advance care planning and goals of care 01/14/2019   Esophageal mass    GAD (generalized anxiety disorder) 10/24/2018   Hyperlipidemia 04/23/2018   Elevated TSH 04/17/2018   Malignant neoplasm of lung (HCC)    Colitis 06/23/2017   Metastatic lung cancer (metastasis from lung to other site), unspecified laterality (HCC)    Hypocalcemia 09/08/2016   Vitamin D deficiency 09/08/2016   Atrial fibrillation (HCC) 09/08/2016   Colitis determined by colorectal biopsy    Diarrhea    Ulcerative pancolitis without complication (HCC)    Cancer (HCC)    AKI (acute kidney injury) (HCC) 09/01/2016   Hypoalbuminemia due to protein-calorie malnutrition (HCC) 07/18/2016   Peripheral edema 07/18/2016   Diarrhea due to drug 05/19/2016   Port catheter in place 04/07/2016   Barrett's esophagus determined by biopsy 09/04/2015   Primary malignant neoplasm of lung metastatic to other site [facility]) 08/06/2015   Esophageal reflux 08/04/2015   Malignant neoplasm metastatic to bone (HCC) 06/24/2015   Neoplasm related pain  06/24/2015   Anorexia 06/24/2015   Depression 06/24/2015   HTN (hypertension) 06/24/2015   Protein-calorie malnutrition, severe (HCC) 06/24/2015   PCP:  Melida Quitter, PA Pharmacy:   [facility] [phone] - [address] [place] - [address] AT [facility] [address] [place] [address] Phone: [phone] Fax: [phone]  [facility] [address] [place] [address] Phone: [phone] Fax: [phone]     Social Drivers of Health (SDOH) Social History: SDOH Screenings    Food Insecurity: No Food Insecurity (01/31/2024)  Housing: Low Risk  (01/31/2024)  Transportation Needs: No Transportation Needs (01/31/2024)  Utilities: Not At Risk (01/31/2024)  Alcohol Screen: Low Risk  (03/28/2023)  Depression (PHQ2-9): Medium Risk (01/30/2024)  Financial Resource Strain: Low Risk  (03/28/2023)  Physical Activity: Inactive (03/28/2023)  Social Connections: Socially Isolated (01/31/2024)  Stress: No Stress Concern Present (03/28/2023)  Tobacco Use: Medium Risk (01/30/2024)   SDOH Interventions:     Readmission Risk Interventions     No data to display

## 2024-01-31 NOTE — Progress Notes (Signed)
 PROGRESS NOTE  Cindy Byrd:[id number] DOB: 03-09-1941 DOA: 01/30/2024 PCP: Melida Quitter, PA   LOS: 1 day   Brief Narrative / Interim history: 83 y.o. female with medical history significant for PE/DVT previously on Eliquis, metastatic lung cancer currently on surveillance, GERD, depression, HSV, cornea ulcer of the right eye and emphysema who was sent from her PCP office for evaluation of hypoxia and hypotension. Patient reports that over the last 2 weeks, she has had generalized malaise and not feeling well.  She also reports dyspnea on exertion, pain with urination, and chronic low back pain but denies chest pain, fevers, chills, abdominal pain, nausea or vomiting. She was seen by her PCP for follow-up on her mood and was found to be hypoxic to 87% and hypotensive with SBP in the 60s to 80s.  She was found to have a PE, was placed on heparin and admitted to the hospital  Subjective / 24h Interval events: She is doing better, still somewhat weak upon standing but overall feels stronger  Assesement and Plan: Principal problem Acute PE-in the setting of being off her Eliquis, she denies this medication being discontinued but just simply "ran out".  She has underlying malignancy which definitely puts her at higher risk of having hypercoagulable state.  Was started on heparin, continue.  Ambulate more today.  2D echo fairly unremarkable with normal EF, normal RV, only grade 1 diastolic dysfunction.  Lower extremity Dopplers negative for DVT.  Active problems UTI-with dysuria, start ceftriaxone, follow-up cultures AKI-mild, creatinine 1.27.  Improved Chronic hypoxic respiratory failure, emphysema-on 3 L at home. Metastatic lung cancer-outpatient follow-up, continue pain control Depression-continue home medications Prior herpes zoster infection, postherpetic neuralgia-continue home medications  Scheduled Meds:  buPROPion  150 mg Oral Daily   DULoxetine  90 mg Oral Daily   fentaNYL   1 patch Transdermal Q72H   fluticasone furoate-vilanterol  1 puff Inhalation Daily   And   umeclidinium bromide  1 puff Inhalation Daily   gabapentin  600 mg Oral TID   pantoprazole  40 mg Oral Daily   valACYclovir  500 mg Oral BID   Continuous Infusions:  cefTRIAXone (ROCEPHIN)  IV     heparin 1,000 Units/hr (01/30/24 2034)   PRN Meds:.acetaminophen **OR** acetaminophen, ondansetron **OR** ondansetron (ZOFRAN) IV, senna-docusate, traZODone  Current Outpatient Medications  Medication Instructions   acetaminophen (TYLENOL) 650 mg, Oral, Every 6 hours PRN   albuterol (VENTOLIN HFA) 108 (90 Base) MCG/ACT inhaler 2 puffs, Inhalation, Every 6 hours PRN   apixaban (ELIQUIS) 2.5 mg, Oral, 2 times daily   Budeson-Glycopyrrol-Formoterol (BREZTRI AEROSPHERE) 160-9-4.8 MCG/ACT AERO INHALE 2 PUFFS INTO THE LUNGS TWICE A DAY   buPROPion (WELLBUTRIN XL) 150 mg, Oral, Daily   DULoxetine (CYMBALTA) 90 mg, Oral, Daily   fentaNYL (DURAGESIC) 25 MCG/HR 1 patch, Transdermal, every 72 hours   gabapentin (NEURONTIN) 600 mg, Oral, 3 times daily   HYDROcodone-acetaminophen (NORCO) 5-325 MG tablet 1 tablet, Oral, Every 6 hours PRN   neomycin-polymyxin b-dexamethasone (MAXITROL) 3.5-10000-0.1 SUSP 1 drop, Right Eye, 4 times daily   omeprazole (PRILOSEC) 40 mg, Oral, Daily before breakfast   ondansetron (ZOFRAN-ODT) 4 mg, Oral, Every 8 hours PRN   traZODone (DESYREL) 100 mg, Oral, At bedtime PRN   valACYclovir (VALTREX) 500 mg, Oral, 2 times daily    Diet Orders (From admission, onward)     Start     Ordered   01/30/24 2058  Diet regular Room service appropriate? Yes; Fluid consistency: Thin  Diet effective  now       Question Answer Comment  Room service appropriate? Yes   Fluid consistency: Thin      01/30/24 2057            DVT prophylaxis:    Lab Results  Component Value Date   PLT 257 01/31/2024      Code Status: Limited: Do not attempt resuscitation (DNR) -DNR-LIMITED -Do Not  Intubate/DNI   Family Communication: friend at bedside  Status is: Inpatient Remains inpatient appropriate because: iv heparin   Level of care: Progressive  Consultants:  none  Objective: Vitals:   01/31/24 0735 01/31/24 0858 01/31/24 0900 01/31/24 1225  BP: 106/87   110/64  Pulse: 92   83  Resp: 18   20  Temp: 99.8 F (37.7 C)   98.1 F (36.7 C)  TempSrc: Oral   Oral  SpO2: 96% 98% 98% 98%  Weight: 59.8 kg     Height: 5\' 8"  (1.727 m)       Intake/Output Summary (Last 24 hours) at 01/31/2024 1417 Last data filed at 01/31/2024 1014 Gross per 24 hour  Intake 120 ml  Output 400 ml  Net -280 ml   Wt Readings from Last 3 Encounters:  01/31/24 59.8 kg  01/30/24 59.8 kg  01/10/24 60.1 kg    Examination:  Constitutional: NAD Eyes: no scleral icterus ENMT: Mucous membranes are moist.  Neck: normal, supple Respiratory: clear to auscultation bilaterally, no wheezing, no crackles. Cardiovascular: Regular rate and rhythm, no murmurs / rubs / gallops. No LE edema.  Abdomen: non distended, no tenderness. Bowel sounds positive.  Musculoskeletal: no clubbing / cyanosis.    Data Reviewed: I have independently reviewed following labs and imaging studies   CBC Recent Labs  Lab 01/30/24 1418 01/31/24 0433  WBC 5.7 12.0*  HGB 13.1 13.1  HCT 41.6 40.8  PLT 280 257  MCV 97.7 97.1  MCH 30.8 31.2  MCHC 31.5 32.1  RDW 15.3 15.3  LYMPHSABS 1.8  --   MONOABS 0.8  --   EOSABS 0.2  --   BASOSABS 0.1  --     Recent Labs  Lab 01/30/24 1418 01/30/24 1710 01/31/24 0433  NA 137  --  137  K 4.7  --  4.6  CL 102  --  103  CO2 27  --  26  GLUCOSE 122*  --  96  BUN 22  --  20  CREATININE 1.27*  --  1.10*  CALCIUM 8.8*  --  7.9*  AST 25  --   --   ALT 12  --   --   ALKPHOS 61  --   --   BILITOT 0.4  --   --   ALBUMIN 3.0*  --   --   TSH  --  2.860  --   BNP  --  140.7*  --      ------------------------------------------------------------------------------------------------------------------ No results for input(s): "CHOL", "HDL", "LDLCALC", "TRIG", "CHOLHDL", "LDLDIRECT" in the last 72 hours.  Lab Results  Component Value Date   HGBA1C 7.4 (H) 06/20/2022   ------------------------------------------------------------------------------------------------------------------ Recent Labs    01/30/24 1710  TSH 2.860    Cardiac Enzymes No results for input(s): "CKMB", "TROPONINI", "MYOGLOBIN" in the last 168 hours.  Invalid input(s): "CK" ------------------------------------------------------------------------------------------------------------------    Component Value Date/Time   BNP 140.7 (H) 01/30/2024 1710    CBG: No results for input(s): "GLUCAP" in the last 168 hours.  No results found for this or any previous visit (from  the past 240 hours).   Radiology Studies: VAS [place] LOWER EXTREMITY VENOUS (DVT) Result Date: 01/31/2024  Lower Venous DVT Study Patient Name:  ELLIANNA RUEST  Date of Exam:   01/31/2024 Medical Rec #: [id number]         Accession #:    [id number] Date of Birth: 30-Aug-1941         Patient Gender: F Patient Age:   24 years Exam Location:  [facility] Procedure:      VAS [place] LOWER EXTREMITY VENOUS (DVT) Referring Phys: PROSPER AMPONSAH --------------------------------------------------------------------------------  Indications: Pain, and pulmonary embolism.  Risk Factors: None identified. Anticoagulation: Heparin. Comparison Study: No prior studies. Performing Technologist: Chanda Busing RVT  Examination Guidelines: A complete evaluation includes B-mode imaging, spectral Doppler, color Doppler, and power Doppler as needed of all accessible portions of each vessel. Bilateral testing is considered an integral part of a complete examination. Limited examinations for reoccurring indications may be performed as noted. The reflux portion  of the exam is performed with the patient in reverse Trendelenburg.  +---------+---------------+---------+-----------+----------+--------------+ RIGHT    CompressibilityPhasicitySpontaneityPropertiesThrombus Aging +---------+---------------+---------+-----------+----------+--------------+ CFV      Full           Yes      Yes                                 +---------+---------------+---------+-----------+----------+--------------+ SFJ      Full                                                        +---------+---------------+---------+-----------+----------+--------------+ FV Prox  Full                                                        +---------+---------------+---------+-----------+----------+--------------+ FV Mid   Full                                                        +---------+---------------+---------+-----------+----------+--------------+ FV DistalFull                                                        +---------+---------------+---------+-----------+----------+--------------+ PFV      Full                                                        +---------+---------------+---------+-----------+----------+--------------+ POP      Full           Yes      Yes                                 +---------+---------------+---------+-----------+----------+--------------+  PTV      Full                                                        +---------+---------------+---------+-----------+----------+--------------+ PERO     Full                                                        +---------+---------------+---------+-----------+----------+--------------+   +---------+---------------+---------+-----------+----------+--------------+ LEFT     CompressibilityPhasicitySpontaneityPropertiesThrombus Aging +---------+---------------+---------+-----------+----------+--------------+ CFV      Full           Yes      Yes                                  +---------+---------------+---------+-----------+----------+--------------+ SFJ      Full                                                        +---------+---------------+---------+-----------+----------+--------------+ FV Prox  Full                                                        +---------+---------------+---------+-----------+----------+--------------+ FV Mid   Full                                                        +---------+---------------+---------+-----------+----------+--------------+ FV DistalFull                                                        +---------+---------------+---------+-----------+----------+--------------+ PFV      Full                                                        +---------+---------------+---------+-----------+----------+--------------+ POP      Full           Yes      Yes                                 +---------+---------------+---------+-----------+----------+--------------+ PTV      Full                                                        +---------+---------------+---------+-----------+----------+--------------+  PERO     Full                                                        +---------+---------------+---------+-----------+----------+--------------+    Summary: RIGHT: - There is no evidence of deep vein thrombosis in the lower extremity.  - No cystic structure found in the popliteal fossa.  LEFT: - There is no evidence of deep vein thrombosis in the lower extremity.  - No cystic structure found in the popliteal fossa.  *See table(s) above for measurements and observations.    Preliminary    ECHOCARDIOGRAM COMPLETE Result Date: 01/31/2024    ECHOCARDIOGRAM REPORT   Patient Name:   LANETTE ELL Date of Exam: 01/31/2024 Medical Rec #:  [id number]        Height:       68.0 in Accession #:    [id number]       Weight:       131.8 lb Date of Birth:  July 21, 1941        BSA:           1.712 m Patient Age:    83 years         BP:           106/87 mmHg Patient Gender: F                HR:           89 bpm. Exam Location:  Inpatient Procedure: 2D Echo, Cardiac Doppler and Color Doppler (Both Spectral and Color            Flow Doppler were utilized during procedure). Indications:    Pulmonary embolus  History:        Patient has prior history of Echocardiogram examinations, most                 recent 03/15/2019. Arrythmias:Atrial Fibrillation; Risk                 Factors:Hypertension and Dyslipidemia.  Sonographer:    Vern Claude Referring Phys: [id number] PROSPER M AMPONSAH IMPRESSIONS  1. Left ventricular ejection fraction, by estimation, is 60 to 65%. The left ventricle has normal function. The left ventricle has no regional wall motion abnormalities. Left ventricular diastolic parameters are consistent with Grade I diastolic dysfunction (impaired relaxation).  2. Right ventricular systolic function is normal. The right ventricular size is normal.  3. The mitral valve is normal in structure. Trivial mitral valve regurgitation. No evidence of mitral stenosis.  4. The aortic valve is normal in structure. Aortic valve regurgitation is not visualized. No aortic stenosis is present.  5. The inferior vena cava is normal in size with greater than 50% respiratory variability, suggesting right atrial pressure of 3 mmHg. FINDINGS  Left Ventricle: Left ventricular ejection fraction, by estimation, is 60 to 65%. The left ventricle has normal function. The left ventricle has no regional wall motion abnormalities. Strain imaging was not performed. The left ventricular internal cavity  size was normal in size. There is no left ventricular hypertrophy. Left ventricular diastolic parameters are consistent with Grade I diastolic dysfunction (impaired relaxation). Right Ventricle: The right ventricular size is normal. No increase in right ventricular wall thickness. Right ventricular systolic function is normal.  Left Atrium: Left atrial size was normal in size. Right  Atrium: Right atrial size was normal in size. Pericardium: There is no evidence of pericardial effusion. Mitral Valve: The mitral valve is normal in structure. Trivial mitral valve regurgitation. No evidence of mitral valve stenosis. MV peak gradient, 3.6 mmHg. The mean mitral valve gradient is 2.0 mmHg. Tricuspid Valve: The tricuspid valve is normal in structure. Tricuspid valve regurgitation is trivial. No evidence of tricuspid stenosis. Aortic Valve: The aortic valve is normal in structure. Aortic valve regurgitation is not visualized. No aortic stenosis is present. Aortic valve mean gradient measures 3.0 mmHg. Aortic valve peak gradient measures 6.4 mmHg. Aortic valve area, by VTI measures 3.41 cm. Pulmonic Valve: The pulmonic valve was normal in structure. Pulmonic valve regurgitation is trivial. No evidence of pulmonic stenosis. Aorta: The aortic root is normal in size and structure. Venous: The inferior vena cava is normal in size with greater than 50% respiratory variability, suggesting right atrial pressure of 3 mmHg. IAS/Shunts: No atrial level shunt detected by color flow Doppler. Additional Comments: 3D imaging was not performed.  LEFT VENTRICLE PLAX 2D LVIDd:         4.20 cm     Diastology LVIDs:         2.80 cm     LV e' medial:    4.57 cm/s LV PW:         0.80 cm     LV E/e' medial:  14.2 LV IVS:        0.80 cm     LV e' lateral:   6.42 cm/s LVOT diam:     1.90 cm     LV E/e' lateral: 10.1 LV SV:         60 LV SV Index:   35 LVOT Area:     2.84 cm  LV Volumes (MOD) LV vol d, MOD A2C: 79.8 ml LV vol d, MOD A4C: 73.3 ml LV vol s, MOD A2C: 25.4 ml LV vol s, MOD A4C: 26.1 ml LV SV MOD A2C:     54.4 ml LV SV MOD A4C:     73.3 ml LV SV MOD BP:      52.9 ml RIGHT VENTRICLE             IVC RV Basal diam:  3.10 cm     IVC diam: 1.90 cm RV Mid diam:    2.30 cm RV S prime:     11.40 cm/s TAPSE (M-mode): 1.7 cm LEFT ATRIUM             Index        RIGHT  ATRIUM          Index LA diam:        3.60 cm 2.10 cm/m   RA Area:     9.36 cm LA Vol (A2C):   23.4 ml 13.67 ml/m  RA Volume:   18.20 ml 10.63 ml/m LA Vol (A4C):   22.2 ml 12.97 ml/m LA Biplane Vol: 24.8 ml 14.49 ml/m  AORTIC VALVE                    PULMONIC VALVE AV Area (Vmax):    2.48 cm     PV Vmax:       0.98 m/s AV Area (Vmean):   2.71 cm     PV Peak grad:  3.9 mmHg AV Area (VTI):     3.41 cm AV Vmax:           126.00 cm/s AV Vmean:  75.700 cm/s AV VTI:            0.177 m AV Peak Grad:      6.4 mmHg AV Mean Grad:      3.0 mmHg LVOT Vmax:         110.00 cm/s LVOT Vmean:        72.300 cm/s LVOT VTI:          0.213 m LVOT/AV VTI ratio: 1.20  AORTA Ao Root diam: 3.60 cm Ao Asc diam:  3.30 cm MITRAL VALVE MV Area (PHT): 1.94 cm    SHUNTS MV Area VTI:   2.83 cm    Systemic VTI:  0.21 m MV Peak grad:  3.6 mmHg    Systemic Diam: 1.90 cm MV Mean grad:  2.0 mmHg MV Vmax:       0.95 m/s MV Vmean:      67.7 cm/s MV Decel Time: 391 msec MV E velocity: 65.10 cm/s MV A velocity: 93.40 cm/s MV E/A ratio:  0.70 Arvilla Meres MD Electronically signed by Arvilla Meres MD Signature Date/Time: 01/31/2024/10:31:29 AM    Final    CT Angio Chest PE W and/or Wo Contrast Addendum Date: 01/30/2024 ADDENDUM REPORT: 01/30/2024 21:50 ADDENDUM: In the impression, an additional item should have been included under the chest section: 5. New 9 mm mean diameter left upper lobe pulmonary nodule, concerning for metastatic disease. Electronically Signed   By: Sharlet Salina M.D.   On: 01/30/2024 21:50   Result Date: 01/30/2024 CLINICAL DATA:  Non-small cell lung cancer, esophageal cancer, history of skeletal metastases, hypoxia, hypotension, malaise, decreased urination EXAM: CT ANGIOGRAPHY CHEST CT ABDOMEN AND PELVIS WITH CONTRAST TECHNIQUE: Multidetector CT imaging of the chest was performed using the standard protocol during bolus administration of intravenous contrast. Multiplanar CT image reconstructions and MIPs  were obtained to evaluate the vascular anatomy. Multidetector CT imaging of the abdomen and pelvis was performed using the standard protocol during bolus administration of intravenous contrast. RADIATION DOSE REDUCTION: This exam was performed according to the departmental dose-optimization program which includes automated exposure control, adjustment of the mA and/or kV according to patient size and/or use of iterative reconstruction technique. CONTRAST:  80mL OMNIPAQUE IOHEXOL 350 MG/ML SOLN COMPARISON:  01/23/2024, 09/19/2023, 05/04/2023 FINDINGS: CTA CHEST FINDINGS Cardiovascular: This is a technically adequate evaluation of the pulmonary vasculature. Stable sequela of chronic thromboembolic disease within synechia in the right upper lobe, and nonocclusive the herein clot within the proximal left upper and left lower lobe pulmonary arteries, without change since prior study. There are acute segmental right lower lobe pulmonary emboli identified on this exam, with minimal clot burden. Stable enlarged main pulmonary artery consistent with chronic pulmonary arterial hypertension. The heart is unremarkable without pericardial effusion. Normal caliber of the thoracic aorta. Stable atherosclerosis of the aorta, with significant plaque involving the distal aortic arch without critical stenosis. No evidence of dissection. Right chest wall port via internal jugular approach, tip within the superior vena cava. Mediastinum/Nodes: No enlarged mediastinal, hilar, or axillary lymph nodes. Thyroid gland, trachea, and esophagus demonstrate no significant findings. Lungs/Pleura: Stable emphysema. No acute airspace disease, effusion, or pneumothorax. Mild bilateral bronchial wall thickening, greatest in the lower lobes. Fluid-filled segmental left lower lobe bronchi are identified, which may be due to retained secretions or aspiration. Since the 09/19/2023 exam, a 10 x 8 mm left upper lobe pulmonary nodule has developed,  reference image 69/13. Musculoskeletal: Multiple prior healed left rib fractures are noted. There are no acute or destructive  bony abnormalities. Reconstructed images demonstrate no additional findings. Review of the MIP images confirms the above findings. CT ABDOMEN and PELVIS FINDINGS Hepatobiliary: Multiple small hypodensities are again seen throughout the liver, unchanged, and most consistent with cysts or hemangiomas. Multiple small gallstones without cholecystitis. No biliary duct dilation. Pancreas: Unremarkable. No pancreatic ductal dilatation or surrounding inflammatory changes. Spleen: Stable splenic atrophy with areas of capsular contraction and scarring. Adrenals/Urinary Tract: Adrenal glands are unremarkable. Kidneys are normal, without renal calculi, focal lesion, or hydronephrosis. The bladder is only minimally distended, limiting its evaluation. There is mild perivesicular fat stranding, and underlying cystitis cannot be excluded. Stomach/Bowel: No bowel obstruction or ileus. Normal appendix right lower quadrant. Diverticulosis of the sigmoid colon without evidence of acute diverticulitis. No bowel wall thickening or inflammatory change. Vascular/Lymphatic: Aortic atherosclerosis. No enlarged abdominal or pelvic lymph nodes. Reproductive: Status post hysterectomy. No adnexal masses. Other: No free fluid or free intraperitoneal gas. No abdominal wall hernia. 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 No acute displaced fracture. Reconstructed images demonstrate no additional findings. Review of the MIP images confirms the above findings. IMPRESSION: Chest: 1. Acute right lower lobe segmental pulmonary emboli. Minimal clot burden with no evidence of right heart strain. 2. Stable sequela of chronic pulmonary thromboembolic disease, with thin synechia in the right upper lobe and chronic adherent nonocclusive thrombus within the left upper and left  lower lobe pulmonary arteries origins. 3. Mild bilateral bronchial wall thickening, greatest in the lower lobes. Fluid-filled segmental left lower lobe bronchi. Findings likely reflect bronchitis and retained secretions. 4. Aortic Atherosclerosis (ICD10-I70.0) and Emphysema (ICD10-J43.9). Abdomen/pelvis: 1. 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 2. No other signs of intra-abdominal or intrapelvic metastases. 3. Sigmoid diverticulosis without diverticulitis. 4. Cholelithiasis without cholecystitis. 5.  Aortic Atherosclerosis (ICD10-I70.0). Critical Value/emergent results were called by telephone at the time of interpretation on 01/30/2024 at 7:22 pm to provider [facility] , who verbally acknowledged these results. Electronically Signed: By: Sharlet Salina M.D. On: 01/30/2024 19:34   CT ABDOMEN PELVIS W CONTRAST Addendum Date: 01/30/2024 ADDENDUM REPORT: 01/30/2024 21:50 ADDENDUM: In the impression, an additional item should have been included under the chest section: 5. New 9 mm mean diameter left upper lobe pulmonary nodule, concerning for metastatic disease. Electronically Signed   By: Sharlet Salina M.D.   On: 01/30/2024 21:50   Result Date: 01/30/2024 CLINICAL DATA:  Non-small cell lung cancer, esophageal cancer, history of skeletal metastases, hypoxia, hypotension, malaise, decreased urination EXAM: CT ANGIOGRAPHY CHEST CT ABDOMEN AND PELVIS WITH CONTRAST TECHNIQUE: Multidetector CT imaging of the chest was performed using the standard protocol during bolus administration of intravenous contrast. Multiplanar CT image reconstructions and MIPs were obtained to evaluate the vascular anatomy. Multidetector CT imaging of the abdomen and pelvis was performed using the standard protocol during bolus administration of intravenous contrast. RADIATION DOSE REDUCTION: This exam was performed according to the departmental dose-optimization program which includes automated exposure  control, adjustment of the mA and/or kV according to patient size and/or use of iterative reconstruction technique. CONTRAST:  80mL OMNIPAQUE IOHEXOL 350 MG/ML SOLN COMPARISON:  01/23/2024, 09/19/2023, 05/04/2023 FINDINGS: CTA CHEST FINDINGS Cardiovascular: This is a technically adequate evaluation of the pulmonary vasculature. Stable sequela of chronic thromboembolic disease within synechia in the right upper lobe, and nonocclusive the herein clot within the proximal left upper and left lower lobe pulmonary arteries, without change since prior study. There are acute segmental right lower  lobe pulmonary emboli identified on this exam, with minimal clot burden. Stable enlarged main pulmonary artery consistent with chronic pulmonary arterial hypertension. The heart is unremarkable without pericardial effusion. Normal caliber of the thoracic aorta. Stable atherosclerosis of the aorta, with significant plaque involving the distal aortic arch without critical stenosis. No evidence of dissection. Right chest wall port via internal jugular approach, tip within the superior vena cava. Mediastinum/Nodes: No enlarged mediastinal, hilar, or axillary lymph nodes. Thyroid gland, trachea, and esophagus demonstrate no significant findings. Lungs/Pleura: Stable emphysema. No acute airspace disease, effusion, or pneumothorax. Mild bilateral bronchial wall thickening, greatest in the lower lobes. Fluid-filled segmental left lower lobe bronchi are identified, which may be due to retained secretions or aspiration. Since the 09/19/2023 exam, a 10 x 8 mm left upper lobe pulmonary nodule has developed, reference image 69/13. Musculoskeletal: Multiple prior healed left rib fractures are noted. There are no acute or destructive bony abnormalities. Reconstructed images demonstrate no additional findings. Review of the MIP images confirms the above findings. CT ABDOMEN and PELVIS FINDINGS Hepatobiliary: Multiple small hypodensities are again  seen throughout the liver, unchanged, and most consistent with cysts or hemangiomas. Multiple small gallstones without cholecystitis. No biliary duct dilation. Pancreas: Unremarkable. No pancreatic ductal dilatation or surrounding inflammatory changes. Spleen: Stable splenic atrophy with areas of capsular contraction and scarring. Adrenals/Urinary Tract: Adrenal glands are unremarkable. Kidneys are normal, without renal calculi, focal lesion, or hydronephrosis. The bladder is only minimally distended, limiting its evaluation. There is mild perivesicular fat stranding, and underlying cystitis cannot be excluded. Stomach/Bowel: No bowel obstruction or ileus. Normal appendix right lower quadrant. Diverticulosis of the sigmoid colon without evidence of acute diverticulitis. No bowel wall thickening or inflammatory change. Vascular/Lymphatic: Aortic atherosclerosis. No enlarged abdominal or pelvic lymph nodes. Reproductive: Status post hysterectomy. No adnexal masses. Other: No free fluid or free intraperitoneal gas. No abdominal wall hernia. 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 No acute displaced fracture. Reconstructed images demonstrate no additional findings. Review of the MIP images confirms the above findings. IMPRESSION: Chest: 1. Acute right lower lobe segmental pulmonary emboli. Minimal clot burden with no evidence of right heart strain. 2. Stable sequela of chronic pulmonary thromboembolic disease, with thin synechia in the right upper lobe and chronic adherent nonocclusive thrombus within the left upper and left lower lobe pulmonary arteries origins. 3. Mild bilateral bronchial wall thickening, greatest in the lower lobes. Fluid-filled segmental left lower lobe bronchi. Findings likely reflect bronchitis and retained secretions. 4. Aortic Atherosclerosis (ICD10-I70.0) and Emphysema (ICD10-J43.9). Abdomen/pelvis: 1. 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 2. No other signs of intra-abdominal or intrapelvic metastases. 3. Sigmoid diverticulosis without diverticulitis. 4. Cholelithiasis without cholecystitis. 5.  Aortic Atherosclerosis (ICD10-I70.0). Critical Value/emergent results were called by telephone at the time of interpretation on 01/30/2024 at 7:22 pm to provider [facility] , who verbally acknowledged these results. Electronically Signed: By: Sharlet Salina M.D. On: 01/30/2024 19:34   CT T-SPINE NO CHARGE Result Date: 01/30/2024 CLINICAL DATA:  Metastatic lung cancer, hypoxia, back pain for 2 weeks EXAM: CT Thoracic and Lumbar spine with contrast TECHNIQUE: Multiplanar CT images of the thoracic and lumbar spine were reconstructed from contemporary CT of the Chest, Abdomen, and Pelvis. RADIATION DOSE REDUCTION: This exam was performed according to the departmental dose-optimization program which includes automated exposure control, adjustment of the mA and/or kV according to patient size and/or use of iterative reconstruction technique. CONTRAST:  No  additional COMPARISON:  09/21/2023, 09/19/2023, 06/15/2023 FINDINGS: CT THORACIC SPINE FINDINGS Alignment: Alignment is anatomic. Vertebrae: There are no acute displaced fractures. No destructive bony lesions. Paraspinal and other soft tissues: The paraspinal soft tissues are unremarkable. Please refer to separately reported chest CT exam describing acute and chronic pulmonary emboli. Disc levels: There is mild diffuse thoracic spondylosis. No bony encroachment upon the central canal or neural foramina. CT LUMBAR SPINE FINDINGS Segmentation: 5 lumbar type vertebrae. Alignment: Right convex scoliosis centered at L3. Otherwise alignment is anatomic. Vertebrae: There are no acute fractures or destructive lesions within the lumbar spine. The mixed lytic and sclerotic metastatic lesion within the left iliac bone with sequela of chronic pathologic fracture is unchanged.  Paraspinal and other soft tissues: The paraspinal soft tissues are unremarkable. Please refer to the separately reported abdomen and pelvic CT for findings in that region. Disc levels: There is circumferential disc bulge bilateral facet hypertrophy at L2-3, L3-4, and L4-5, with mild central canal stenosis. The neural foramina are grossly patent at L2-3 and L3-4, with left predominant neural foraminal narrowing at L4-5. Reconstructed images demonstrate no additional findings. IMPRESSION: 1. No acute or destructive lesions within the thoracolumbar spine. 2. Mild diffuse thoracic spondylosis without compressive sequela. 3. Multilevel lumbar spondylosis and facet hypertrophy, most pronounced from L2-3 through L4-5. 4. Mixed sclerotic and lytic lesion within the left iliac bone again noted, compatible with known bony metastases. 5. Please refer to separately reported CT chest, abdomen, and pelvis exams for important findings in those regions. Electronically Signed   By: Sharlet Salina M.D.   On: 01/30/2024 19:27   CT L-SPINE NO CHARGE Result Date: 01/30/2024 CLINICAL DATA:  Metastatic lung cancer, hypoxia, back pain for 2 weeks EXAM: CT Thoracic and Lumbar spine with contrast TECHNIQUE: Multiplanar CT images of the thoracic and lumbar spine were reconstructed from contemporary CT of the Chest, Abdomen, and Pelvis. RADIATION DOSE REDUCTION: This exam was performed according to the departmental dose-optimization program which includes automated exposure control, adjustment of the mA and/or kV according to patient size and/or use of iterative reconstruction technique. CONTRAST:  No additional COMPARISON:  09/21/2023, 09/19/2023, 06/15/2023 FINDINGS: CT THORACIC SPINE FINDINGS Alignment: Alignment is anatomic. Vertebrae: There are no acute displaced fractures. No destructive bony lesions. Paraspinal and other soft tissues: The paraspinal soft tissues are unremarkable. Please refer to separately reported chest CT exam  describing acute and chronic pulmonary emboli. Disc levels: There is mild diffuse thoracic spondylosis. No bony encroachment upon the central canal or neural foramina. CT LUMBAR SPINE FINDINGS Segmentation: 5 lumbar type vertebrae. Alignment: Right convex scoliosis centered at L3. Otherwise alignment is anatomic. Vertebrae: There are no acute fractures or destructive lesions within the lumbar spine. The mixed lytic and sclerotic metastatic lesion within the left iliac bone with sequela of chronic pathologic fracture is unchanged. Paraspinal and other soft tissues: The paraspinal soft tissues are unremarkable. Please refer to the separately reported abdomen and pelvic CT for findings in that region. Disc levels: There is circumferential disc bulge bilateral facet hypertrophy at L2-3, L3-4, and L4-5, with mild central canal stenosis. The neural foramina are grossly patent at L2-3 and L3-4, with left predominant neural foraminal narrowing at L4-5. Reconstructed images demonstrate no additional findings. IMPRESSION: 1. No acute or destructive lesions within the thoracolumbar spine. 2. Mild diffuse thoracic spondylosis without compressive sequela. 3. Multilevel lumbar spondylosis and facet hypertrophy, most pronounced from L2-3 through L4-5. 4. Mixed sclerotic and lytic lesion within the left iliac bone again noted,  compatible with known bony metastases. 5. Please refer to separately reported CT chest, abdomen, and pelvis exams for important findings in those regions. Electronically Signed   By: Sharlet Salina M.D.   On: 01/30/2024 19:27     Pamella Pert, MD, PhD [facility]  Between 7 am - 7 pm I am available, please contact me via Amion (for emergencies) or Securechat (non urgent messages)  Between 7 pm - 7 am I am not available, please contact night coverage MD/APP via Amion

## 2024-01-31 NOTE — Progress Notes (Signed)
 PHARMACY - ANTICOAGULATION CONSULT NOTE  Pharmacy Consult for heparin Indication: pulmonary embolus  Allergies  Allergen Reactions   Penicillins Other (See Comments)    Unknown; childhood allergy - details unknown Tolerates Ancef   Remeron [Mirtazapine] Other (See Comments)    nightmares   Latex Rash    Patient Measurements: Height: 5\' 8"  (172.7 cm) Weight: 59.8 kg (131 lb 12.8 oz) IBW/kg (Calculated) : 63.9 Heparin Dosing Weight: 59.8 kg  Vital Signs: Temp: 98.1 F (36.7 C) (02/26 1225) Temp Source: Oral (02/26 1225) BP: 110/64 (02/26 1225) Pulse Rate: 83 (02/26 1225)  Labs: Recent Labs    01/30/24 1418 01/30/24 1945 01/31/24 0432 01/31/24 0433 01/31/24 0621  HGB 13.1  --   --  13.1  --   HCT 41.6  --   --  40.8  --   PLT 280  --   --  257  --   APTT  --   --   --  >200* 121*  HEPARINUNFRC  --   --  >1.10*  --  0.65  CREATININE 1.27*  --   --  1.10*  --   TROPONINIHS 10 8  --   --   --     Estimated Creatinine Clearance: 35.9 mL/min (A) (by C-G formula based on SCr of 1.1 mg/dL (H)).   Medical History: Past Medical History:  Diagnosis Date   Barrett's esophagus    Bilateral pulmonary embolism (HCC) 09/02/2016   09/02/16 bilateral pulmonary emboli in context of extensive bilateral lower extremity deep venous thromboses Assumed hypercoagulability due to non-small cell metastatic lung cancer Lifelong anticoagulation recommended   Bone neoplasm 06/24/2015   Cancer (HCC)    metastatic poorly differentiated carcinoma. tumor left groin surgical removal with radiation tx.   Cataract    BILATERAL   Cigarette smoker two packs a day or less    Currently still smoking 2 PPD - Not interested in quitting at this time.   Colitis 2017   Colon polyps    hyperplastic, tubular adenomas, tubulovillous adenoma   Cough, persistent    hx. lung cancer ? primary-being evaluated, unsure of primary site.   Depression 06/24/2015   Diverticulosis    DVT of lower extremity,  bilateral (HCC) 09/02/2016   Dysrhythmia    PAF in setting of submassive PE 09/2016   Emphysema of lung (HCC)    Endometriosis    Hysterectomy with BSO at age 80 yrs   Esophageal adenocarcinoma (HCC) 08/11/2015   intramucosal   Gastritis    GERD (gastroesophageal reflux disease)    H/O: pneumonia    Hiatal hernia    Hyperglycemia    A1c 7.4% 06/20/22   Hyperlipidemia    Hypertension 06/24/2015   likely improved incidental to 40 lbs weight loss from her neoplasm. No Longer taking med for this as of 08-06-15   IBS (irritable bowel syndrome)    Lung cancer (HCC)    suspected IV lung cancer with left ileum mets 2017, s/p left ileum radiaiton, immunotherapy   Pain    left hip-persistent"tumor of bone"-radiation tx. 10.   Pneumonia    Vitamin D deficiency disease       Assessment: 83 year old female presented from PCP office with hypoxia and hypotension. Patient with history of PE on Eliquis 2.5 mg BID. Patient reports it has been a week since she has taken her Eliquis. She has history of lung cancer no longer on treatment. CTA chest with acute right lower lobe segmental PE, minimal  clot burden and no evidence of right heart strain. Pharmacy consulted for heparin management.  Today 01/31/24: - CBC: hgb stable WNL - Elevated aPTT and therapeutic HL indicates no DOAC interference  - HL within therapeutic range - No bleeding or issues with the line per nursing  Goal of Therapy:  Heparin level 0.3-0.7 units/ml Monitor platelets by anticoagulation protocol: Yes   Plan:  - Continue heparin infusion at 1000 units/hr - Check 8 hour confirmatory HL  - aPTT not needed (DOAC not on board) - Daily CBC - Monitor for signs of bleeding or thrombosis  Frederic Jericho, PharmD Candidate 01/31/2024 12:26 PM

## 2024-01-31 NOTE — Progress Notes (Signed)
  Echocardiogram 2D Echocardiogram has been performed.  Ocie Doyne RDCS 01/31/2024, 10:06 AM

## 2024-01-31 NOTE — ED Notes (Signed)
 Pharmacy made aware about aptt result

## 2024-01-31 NOTE — Progress Notes (Signed)
 Pt ambulated in the hallway 283ft without issue.

## 2024-01-31 NOTE — Progress Notes (Signed)
 Bilateral lower extremity venous duplex has been completed. Preliminary results can be found in CV Proc through chart review.   01/31/24 9:42 AM Olen Cordial RVT

## 2024-01-31 NOTE — H&P (Signed)
 History and Physical  Cindy Byrd ZOX:[id number] DOB: 1941/09/10 DOA: 01/30/2024  PCP: Melida Quitter, PA   Chief Complaint: Hypoxia, hypotension  HPI: Cindy Byrd is a 83 y.o. female with medical history significant for PE/DVT previously on Eliquis, metastatic lung cancer currently on surveillance, GERD, depression, HSV, cornea ulcer of the right eye and emphysema who was sent from her PCP office for evaluation of hypoxia and hypotension. Patient reports that over the last 2 weeks, she has had generalized malaise and not feeling well.  She also reports dyspnea on exertion, pain with urination, and chronic low back pain but denies chest pain, fevers, chills, abdominal pain, nausea or vomiting. She was seen by her PCP for follow-up on her mood and was found to be hypoxic to 87% and hypotensive with SBP in the 60s to 80s. Patient reports she is supposed to be on baseline 3 L [place] and has oxygen at home but currently does not have a portable oxygen device to take with her.  Of note, patient's last refill for Eliquis was on August 21 for 30-day supply.  Patient states after running out of the prescription she was unsure the provider who prescribed it so she never had refills after September.  ED Course: Initial vitals showed temp 98.5, RR 16, BP 64/37, repeat improved to 98/53, SpO2 93% on 3 L [place].  Labs significant for creatinine of 1.27, BNP 140, normal CBC, troponin and TSH.  UA shows mild bilirubinuria, mild proteinuria, trace leuks, negative nitrite, WBC 0-5 and rare bacteria.  CT A/P shows mild pelvic vascular fat stranding, underlying cystitis cannot be excluded.  CTA chest PE study shows acute lower lobe segmental PE with minimal clot burden no evidence of right heart strain.  Patient was given IV LR 1 L bolus, IV Rocephin and started on IV heparin drip. [facility] was consulted for admission  Review of Systems: Please see HPI for pertinent positives and negatives. A complete 10 system review of  systems are otherwise negative.  Past Medical History:  Diagnosis Date   Barrett's esophagus    Bilateral pulmonary embolism (HCC) 09/02/2016   09/02/16 bilateral pulmonary emboli in context of extensive bilateral lower extremity deep venous thromboses Assumed hypercoagulability due to non-small cell metastatic lung cancer Lifelong anticoagulation recommended   Bone neoplasm 06/24/2015   Cancer (HCC)    metastatic poorly differentiated carcinoma. tumor left groin surgical removal with radiation tx.   Cataract    BILATERAL   Cigarette smoker two packs a day or less    Currently still smoking 2 PPD - Not interested in quitting at this time.   Colitis 2017   Colon polyps    hyperplastic, tubular adenomas, tubulovillous adenoma   Cough, persistent    hx. lung cancer ? primary-being evaluated, unsure of primary site.   Depression 06/24/2015   Diverticulosis    DVT of lower extremity, bilateral (HCC) 09/02/2016   Dysrhythmia    PAF in setting of submassive PE 09/2016   Emphysema of lung (HCC)    Endometriosis    Hysterectomy with BSO at age 40 yrs   Esophageal adenocarcinoma (HCC) 08/11/2015   intramucosal   Gastritis    GERD (gastroesophageal reflux disease)    H/O: pneumonia    Hiatal hernia    Hyperglycemia    A1c 7.4% 06/20/22   Hyperlipidemia    Hypertension 06/24/2015   likely improved incidental to 40 lbs weight loss from her neoplasm. No Longer taking med for this as of  08-06-15   IBS (irritable bowel syndrome)    Lung cancer (HCC)    suspected IV lung cancer with left ileum mets 2017, s/p left ileum radiaiton, immunotherapy   Pain    left hip-persistent"tumor of bone"-radiation tx. 10.   Pneumonia    Vitamin D deficiency disease    Past Surgical History:  Procedure Laterality Date   ABDOMINAL HYSTERECTOMY     BALLOON DILATION N/A 10/08/2019   Procedure: BALLOON DILATION;  Surgeon: Beverley Fiedler, MD;  Location: [facility];  Service: Gastroenterology;  Laterality:  N/A;   BARTHOLIN GLAND CYST EXCISION  83 yo ago   Does not want if it was an infected cyst or tumor. Was soon as delivery   BIOPSY  01/02/2019   Procedure: BIOPSY;  Surgeon: Beverley Fiedler, MD;  Location: [facility];  Service: Gastroenterology;;   CATARACT EXTRACTION     COLONOSCOPY W/ POLYPECTOMY     multiple times - last done 09/2014 per patient.   ESOPHAGOGASTRODUODENOSCOPY (EGD) WITH PROPOFOL N/A 08/11/2015   Procedure: ESOPHAGOGASTRODUODENOSCOPY (EGD) WITH PROPOFOL;  Surgeon: Beverley Fiedler, MD;  Location: [facility];  Service: Gastroenterology;  Laterality: N/A;   ESOPHAGOGASTRODUODENOSCOPY (EGD) WITH PROPOFOL N/A 01/02/2019   Procedure: ESOPHAGOGASTRODUODENOSCOPY (EGD) WITH PROPOFOL;  Surgeon: Beverley Fiedler, MD;  Location: [facility];  Service: Gastroenterology;  Laterality: N/A;   ESOPHAGOGASTRODUODENOSCOPY (EGD) WITH PROPOFOL N/A 10/08/2019   Procedure: ESOPHAGOGASTRODUODENOSCOPY (EGD) WITH PROPOFOL;  Surgeon: Beverley Fiedler, MD;  Location: [facility];  Service: Gastroenterology;  Laterality: N/A;   EYE SURGERY Right 2024   Multiple surgeries over the past year.   FLEXIBLE SIGMOIDOSCOPY N/A 06/24/2017   Procedure: FLEXIBLE SIGMOIDOSCOPY;  Surgeon: Ruffin Frederick, MD;  Location: [facility];  Service: Gastroenterology;  Laterality: N/A;   GANGLION CYST EXCISION     KNEE ARTHROSCOPY  age about 83 yrs   TONSILLECTOMY     TOTAL ABDOMINAL HYSTERECTOMY W/ BILATERAL SALPINGOOPHORECTOMY  at age 73 yrs   For endometriosis   VIDEO BRONCHOSCOPY WITH ENDOBRONCHIAL ULTRASOUND Bilateral 06/06/2023   Procedure: VIDEO BRONCHOSCOPY WITH ENDOBRONCHIAL ULTRASOUND;  Surgeon: Josephine Igo, DO;  Location: [facility];  Service: Cardiopulmonary;  Laterality: Bilateral;   Social History:  reports that she quit smoking about 9 years ago. Her smoking use included cigarettes. She started smoking about 69 years ago. She has a 60 pack-year smoking history. She has never been exposed to tobacco  smoke. She has never used smokeless tobacco. She reports that she does not drink alcohol and does not use drugs.  Allergies  Allergen Reactions   Penicillins Other (See Comments)    Unknown; childhood allergy - details unknown Tolerates Ancef   Remeron [Mirtazapine] Other (See Comments)    nightmares   Latex Rash    Family History  Problem Relation Age of Onset   Colon cancer Brother    Colon cancer Brother    Stroke Mother    Colon cancer Father    Emphysema Father        smoked   Breast cancer Daughter 51       ER/PR+ stage II     Prior to Admission medications   Medication Sig Start Date End Date Taking? Authorizing Provider  acetaminophen (TYLENOL) 325 MG tablet Take 2 tablets (650 mg total) by mouth every 6 (six) hours as needed for mild pain (or Fever >/= 101). 09/12/22  Yes Sheikh, Omair Latif, DO  Budeson-Glycopyrrol-Formoterol (BREZTRI AEROSPHERE) 160-9-4.8 MCG/ACT AERO INHALE 2 PUFFS INTO THE LUNGS TWICE A  DAY 10/24/23  Yes Saralyn Pilar A, PA  buPROPion (WELLBUTRIN XL) 150 MG 24 hr tablet TAKE 1 TABLET BY MOUTH EVERY DAY 10/17/22  Yes Abonza, Maritza, PA-C  DULoxetine (CYMBALTA) 30 MG capsule Take 3 capsules (90 mg total) by mouth daily. 10/24/23  Yes Saralyn Pilar A, PA  fentaNYL (DURAGESIC) 25 MCG/HR Place 1 patch onto the skin every 3 (three) days. 01/10/24  Yes Johney Maine, MD  gabapentin (NEURONTIN) 600 MG tablet Take 1 tablet (600 mg total) by mouth 3 (three) times daily. 10/24/23  Yes Saralyn Pilar A, PA  HYDROcodone-acetaminophen (NORCO) 5-325 MG tablet Take 1 tablet by mouth every 6 (six) hours as needed for moderate pain. 04/14/23  Yes Johney Maine, MD  omeprazole (PRILOSEC) 40 MG capsule Take 1 capsule (40 mg total) by mouth daily before breakfast. 01/10/24  Yes Johney Maine, MD  ondansetron (ZOFRAN-ODT) 4 MG disintegrating tablet Take 1 tablet (4 mg total) by mouth every 8 (eight) hours as needed for nausea or vomiting. 05/29/23  Yes  Saralyn Pilar A, PA  traZODone (DESYREL) 100 MG tablet Take 100 mg by mouth at bedtime as needed for sleep.   Yes [provider]  valACYclovir (VALTREX) 500 MG tablet TAKE 1 TABLET BY MOUTH TWICE A DAY 09/12/22  Yes Johney Maine, MD  albuterol (VENTOLIN HFA) 108 (90 Base) MCG/ACT inhaler Inhale 2 puffs into the lungs every 6 (six) hours as needed for wheezing. Patient not taking: Reported on 01/30/2024 03/14/23   Saralyn Pilar A, PA  apixaban (ELIQUIS) 2.5 MG TABS tablet Take 1 tablet (2.5 mg total) by mouth 2 (two) times daily. Patient not taking: Reported on 01/30/2024 05/07/23   Johney Maine, MD  neomycin-polymyxin b-dexamethasone (MAXITROL) 3.5-10000-0.1 SUSP Place 1 drop into the right eye 4 (four) times daily. 03/30/23   [provider]    Physical Exam: BP (!) 122/56   Pulse 73   Temp 97.9 F (36.6 C) (Oral)   Resp 18   SpO2 92%  General: Pleasant, chronically ill elderly woman laying in bed. No acute distress. HEENT: [place]/AT. Anicteric sclera. Right eye with noticeable corneal ulcer CV: RRR. No murmurs, rubs, or gallops. No LE edema Pulmonary: On 3 L [place]. Lungs CTAB. Normal effort. No wheezing or rales. Abdominal: Soft, nontender, nondistended. Normal bowel sounds. Extremities: Palpable radial and DP pulses. Normal ROM. Skin: Warm and dry. No obvious rash or lesions. Neuro: A&Ox3. Moves all extremities. Normal sensation to light touch. No focal deficit. Psych: Normal mood and affect          Labs on Admission:  Basic Metabolic Panel: Recent Labs  Lab 01/30/24 1418  NA 137  K 4.7  CL 102  CO2 27  GLUCOSE 122*  BUN 22  CREATININE 1.27*  CALCIUM 8.8*   Liver Function Tests: Recent Labs  Lab 01/30/24 1418  AST 25  ALT 12  ALKPHOS 61  BILITOT 0.4  PROT 7.6  ALBUMIN 3.0*   No results for input(s): "LIPASE", "AMYLASE" in the last 168 hours. No results for input(s): "AMMONIA" in the last 168 hours. CBC: Recent Labs  Lab  01/30/24 1418  WBC 5.7  NEUTROABS 2.8  HGB 13.1  HCT 41.6  MCV 97.7  PLT 280   Cardiac Enzymes: No results for input(s): "CKTOTAL", "CKMB", "CKMBINDEX", "TROPONINI" in the last 168 hours. BNP (last 3 results) Recent Labs    01/30/24 1710  BNP 140.7*    ProBNP (last 3 results) No results for input(s): "  PROBNP" in the last 8760 hours.  CBG: No results for input(s): "GLUCAP" in the last 168 hours.  Radiological Exams on Admission: CT Angio Chest PE W and/or Wo Contrast Addendum Date: 01/30/2024 ADDENDUM REPORT: 01/30/2024 21:50 ADDENDUM: In the impression, an additional item should have been included under the chest section: 5. New 9 mm mean diameter left upper lobe pulmonary nodule, concerning for metastatic disease. Electronically Signed   By: Sharlet Salina M.D.   On: 01/30/2024 21:50   Result Date: 01/30/2024 CLINICAL DATA:  Non-small cell lung cancer, esophageal cancer, history of skeletal metastases, hypoxia, hypotension, malaise, decreased urination EXAM: CT ANGIOGRAPHY CHEST CT ABDOMEN AND PELVIS WITH CONTRAST TECHNIQUE: Multidetector CT imaging of the chest was performed using the standard protocol during bolus administration of intravenous contrast. Multiplanar CT image reconstructions and MIPs were obtained to evaluate the vascular anatomy. Multidetector CT imaging of the abdomen and pelvis was performed using the standard protocol during bolus administration of intravenous contrast. RADIATION DOSE REDUCTION: This exam was performed according to the departmental dose-optimization program which includes automated exposure control, adjustment of the mA and/or kV according to patient size and/or use of iterative reconstruction technique. CONTRAST:  80mL OMNIPAQUE IOHEXOL 350 MG/ML SOLN COMPARISON:  01/23/2024, 09/19/2023, 05/04/2023 FINDINGS: CTA CHEST FINDINGS Cardiovascular: This is a technically adequate evaluation of the pulmonary vasculature. Stable sequela of chronic  thromboembolic disease within synechia in the right upper lobe, and nonocclusive the herein clot within the proximal left upper and left lower lobe pulmonary arteries, without change since prior study. There are acute segmental right lower lobe pulmonary emboli identified on this exam, with minimal clot burden. Stable enlarged main pulmonary artery consistent with chronic pulmonary arterial hypertension. The heart is unremarkable without pericardial effusion. Normal caliber of the thoracic aorta. Stable atherosclerosis of the aorta, with significant plaque involving the distal aortic arch without critical stenosis. No evidence of dissection. Right chest wall port via internal jugular approach, tip within the superior vena cava. Mediastinum/Nodes: No enlarged mediastinal, hilar, or axillary lymph nodes. Thyroid gland, trachea, and esophagus demonstrate no significant findings. Lungs/Pleura: Stable emphysema. No acute airspace disease, effusion, or pneumothorax. Mild bilateral bronchial wall thickening, greatest in the lower lobes. Fluid-filled segmental left lower lobe bronchi are identified, which may be due to retained secretions or aspiration. Since the 09/19/2023 exam, a 10 x 8 mm left upper lobe pulmonary nodule has developed, reference image 69/13. Musculoskeletal: Multiple prior healed left rib fractures are noted. There are no acute or destructive bony abnormalities. Reconstructed images demonstrate no additional findings. Review of the MIP images confirms the above findings. CT ABDOMEN and PELVIS FINDINGS Hepatobiliary: Multiple small hypodensities are again seen throughout the liver, unchanged, and most consistent with cysts or hemangiomas. Multiple small gallstones without cholecystitis. No biliary duct dilation. Pancreas: Unremarkable. No pancreatic ductal dilatation or surrounding inflammatory changes. Spleen: Stable splenic atrophy with areas of capsular contraction and scarring. Adrenals/Urinary Tract:  Adrenal glands are unremarkable. Kidneys are normal, without renal calculi, focal lesion, or hydronephrosis. The bladder is only minimally distended, limiting its evaluation. There is mild perivesicular fat stranding, and underlying cystitis cannot be excluded. Stomach/Bowel: No bowel obstruction or ileus. Normal appendix right lower quadrant. Diverticulosis of the sigmoid colon without evidence of acute diverticulitis. No bowel wall thickening or inflammatory change. Vascular/Lymphatic: Aortic atherosclerosis. No enlarged abdominal or pelvic lymph nodes. Reproductive: Status post hysterectomy. No adnexal masses. Other: No free fluid or free intraperitoneal gas. No abdominal wall hernia. Musculoskeletal: Stable mixed lytic and sclerotic osseous  metastasis within the left iliac bone. No acute displaced fracture. Reconstructed images demonstrate no additional findings. Review of the MIP images confirms the above findings. IMPRESSION: Chest: 1. Acute right lower lobe segmental pulmonary emboli. Minimal clot burden with no evidence of right heart strain. 2. Stable sequela of chronic pulmonary thromboembolic disease, with thin synechia in the right upper lobe and chronic adherent nonocclusive thrombus within the left upper and left lower lobe pulmonary arteries origins. 3. Mild bilateral bronchial wall thickening, greatest in the lower lobes. Fluid-filled segmental left lower lobe bronchi. Findings likely reflect bronchitis and retained secretions. 4. Aortic Atherosclerosis (ICD10-I70.0) and Emphysema (ICD10-J43.9). Abdomen/pelvis: 1. 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 2. No other signs of intra-abdominal or intrapelvic metastases. 3. Sigmoid diverticulosis without diverticulitis. 4. Cholelithiasis without cholecystitis. 5.  Aortic Atherosclerosis (ICD10-I70.0). Critical Value/emergent results were called by telephone at the time of interpretation on 01/30/2024 at 7:22 pm to  provider [facility] , who verbally acknowledged these results. Electronically Signed: By: Sharlet Salina M.D. On: 01/30/2024 19:34   CT ABDOMEN PELVIS W CONTRAST Addendum Date: 01/30/2024 ADDENDUM REPORT: 01/30/2024 21:50 ADDENDUM: In the impression, an additional item should have been included under the chest section: 5. New 9 mm mean diameter left upper lobe pulmonary nodule, concerning for metastatic disease. Electronically Signed   By: Sharlet Salina M.D.   On: 01/30/2024 21:50   Result Date: 01/30/2024 CLINICAL DATA:  Non-small cell lung cancer, esophageal cancer, history of skeletal metastases, hypoxia, hypotension, malaise, decreased urination EXAM: CT ANGIOGRAPHY CHEST CT ABDOMEN AND PELVIS WITH CONTRAST TECHNIQUE: Multidetector CT imaging of the chest was performed using the standard protocol during bolus administration of intravenous contrast. Multiplanar CT image reconstructions and MIPs were obtained to evaluate the vascular anatomy. Multidetector CT imaging of the abdomen and pelvis was performed using the standard protocol during bolus administration of intravenous contrast. RADIATION DOSE REDUCTION: This exam was performed according to the departmental dose-optimization program which includes automated exposure control, adjustment of the mA and/or kV according to patient size and/or use of iterative reconstruction technique. CONTRAST:  80mL OMNIPAQUE IOHEXOL 350 MG/ML SOLN COMPARISON:  01/23/2024, 09/19/2023, 05/04/2023 FINDINGS: CTA CHEST FINDINGS Cardiovascular: This is a technically adequate evaluation of the pulmonary vasculature. Stable sequela of chronic thromboembolic disease within synechia in the right upper lobe, and nonocclusive the herein clot within the proximal left upper and left lower lobe pulmonary arteries, without change since prior study. There are acute segmental right lower lobe pulmonary emboli identified on this exam, with minimal clot burden. Stable enlarged main  pulmonary artery consistent with chronic pulmonary arterial hypertension. The heart is unremarkable without pericardial effusion. Normal caliber of the thoracic aorta. Stable atherosclerosis of the aorta, with significant plaque involving the distal aortic arch without critical stenosis. No evidence of dissection. Right chest wall port via internal jugular approach, tip within the superior vena cava. Mediastinum/Nodes: No enlarged mediastinal, hilar, or axillary lymph nodes. Thyroid gland, trachea, and esophagus demonstrate no significant findings. Lungs/Pleura: Stable emphysema. No acute airspace disease, effusion, or pneumothorax. Mild bilateral bronchial wall thickening, greatest in the lower lobes. Fluid-filled segmental left lower lobe bronchi are identified, which may be due to retained secretions or aspiration. Since the 09/19/2023 exam, a 10 x 8 mm left upper lobe pulmonary nodule has developed, reference image 69/13. Musculoskeletal: Multiple prior healed left rib fractures are noted. There are no acute or destructive bony abnormalities. Reconstructed images demonstrate no additional findings. Review of the MIP images  confirms the above findings. CT ABDOMEN and PELVIS FINDINGS Hepatobiliary: Multiple small hypodensities are again seen throughout the liver, unchanged, and most consistent with cysts or hemangiomas. Multiple small gallstones without cholecystitis. No biliary duct dilation. Pancreas: Unremarkable. No pancreatic ductal dilatation or surrounding inflammatory changes. Spleen: Stable splenic atrophy with areas of capsular contraction and scarring. Adrenals/Urinary Tract: Adrenal glands are unremarkable. Kidneys are normal, without renal calculi, focal lesion, or hydronephrosis. The bladder is only minimally distended, limiting its evaluation. There is mild perivesicular fat stranding, and underlying cystitis cannot be excluded. Stomach/Bowel: No bowel obstruction or ileus. Normal appendix right  lower quadrant. Diverticulosis of the sigmoid colon without evidence of acute diverticulitis. No bowel wall thickening or inflammatory change. Vascular/Lymphatic: Aortic atherosclerosis. No enlarged abdominal or pelvic lymph nodes. Reproductive: Status post hysterectomy. No adnexal masses. Other: No free fluid or free intraperitoneal gas. No abdominal wall hernia. 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 No acute displaced fracture. Reconstructed images demonstrate no additional findings. Review of the MIP images confirms the above findings. IMPRESSION: Chest: 1. Acute right lower lobe segmental pulmonary emboli. Minimal clot burden with no evidence of right heart strain. 2. Stable sequela of chronic pulmonary thromboembolic disease, with thin synechia in the right upper lobe and chronic adherent nonocclusive thrombus within the left upper and left lower lobe pulmonary arteries origins. 3. Mild bilateral bronchial wall thickening, greatest in the lower lobes. Fluid-filled segmental left lower lobe bronchi. Findings likely reflect bronchitis and retained secretions. 4. Aortic Atherosclerosis (ICD10-I70.0) and Emphysema (ICD10-J43.9). Abdomen/pelvis: 1. 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 2. No other signs of intra-abdominal or intrapelvic metastases. 3. Sigmoid diverticulosis without diverticulitis. 4. Cholelithiasis without cholecystitis. 5.  Aortic Atherosclerosis (ICD10-I70.0). Critical Value/emergent results were called by telephone at the time of interpretation on 01/30/2024 at 7:22 pm to provider [facility] , who verbally acknowledged these results. Electronically Signed: By: Sharlet Salina M.D. On: 01/30/2024 19:34   CT T-SPINE NO CHARGE Result Date: 01/30/2024 CLINICAL DATA:  Metastatic lung cancer, hypoxia, back pain for 2 weeks EXAM: CT Thoracic and Lumbar spine with contrast TECHNIQUE: Multiplanar CT images of  the thoracic and lumbar spine were reconstructed from contemporary CT of the Chest, Abdomen, and Pelvis. RADIATION DOSE REDUCTION: This exam was performed according to the departmental dose-optimization program which includes automated exposure control, adjustment of the mA and/or kV according to patient size and/or use of iterative reconstruction technique. CONTRAST:  No additional COMPARISON:  09/21/2023, 09/19/2023, 06/15/2023 FINDINGS: CT THORACIC SPINE FINDINGS Alignment: Alignment is anatomic. Vertebrae: There are no acute displaced fractures. No destructive bony lesions. Paraspinal and other soft tissues: The paraspinal soft tissues are unremarkable. Please refer to separately reported chest CT exam describing acute and chronic pulmonary emboli. Disc levels: There is mild diffuse thoracic spondylosis. No bony encroachment upon the central canal or neural foramina. CT LUMBAR SPINE FINDINGS Segmentation: 5 lumbar type vertebrae. Alignment: Right convex scoliosis centered at L3. Otherwise alignment is anatomic. Vertebrae: There are no acute fractures or destructive lesions within the lumbar spine. The mixed lytic and sclerotic metastatic lesion within the left iliac bone with sequela of chronic pathologic fracture is unchanged. Paraspinal and other soft tissues: The paraspinal soft tissues are unremarkable. Please refer to the separately reported abdomen and pelvic CT for findings in that region. Disc levels: There is circumferential disc bulge bilateral facet hypertrophy at L2-3, L3-4, and L4-5, with mild central canal stenosis. The neural foramina  are grossly patent at L2-3 and L3-4, with left predominant neural foraminal narrowing at L4-5. Reconstructed images demonstrate no additional findings. IMPRESSION: 1. No acute or destructive lesions within the thoracolumbar spine. 2. Mild diffuse thoracic spondylosis without compressive sequela. 3. Multilevel lumbar spondylosis and facet hypertrophy, most pronounced  from L2-3 through L4-5. 4. Mixed sclerotic and lytic lesion within the left iliac bone again noted, compatible with known bony metastases. 5. Please refer to separately reported CT chest, abdomen, and pelvis exams for important findings in those regions. Electronically Signed   By: Sharlet Salina M.D.   On: 01/30/2024 19:27   CT L-SPINE NO CHARGE Result Date: 01/30/2024 CLINICAL DATA:  Metastatic lung cancer, hypoxia, back pain for 2 weeks EXAM: CT Thoracic and Lumbar spine with contrast TECHNIQUE: Multiplanar CT images of the thoracic and lumbar spine were reconstructed from contemporary CT of the Chest, Abdomen, and Pelvis. RADIATION DOSE REDUCTION: This exam was performed according to the departmental dose-optimization program which includes automated exposure control, adjustment of the mA and/or kV according to patient size and/or use of iterative reconstruction technique. CONTRAST:  No additional COMPARISON:  09/21/2023, 09/19/2023, 06/15/2023 FINDINGS: CT THORACIC SPINE FINDINGS Alignment: Alignment is anatomic. Vertebrae: There are no acute displaced fractures. No destructive bony lesions. Paraspinal and other soft tissues: The paraspinal soft tissues are unremarkable. Please refer to separately reported chest CT exam describing acute and chronic pulmonary emboli. Disc levels: There is mild diffuse thoracic spondylosis. No bony encroachment upon the central canal or neural foramina. CT LUMBAR SPINE FINDINGS Segmentation: 5 lumbar type vertebrae. Alignment: Right convex scoliosis centered at L3. Otherwise alignment is anatomic. Vertebrae: There are no acute fractures or destructive lesions within the lumbar spine. The mixed lytic and sclerotic metastatic lesion within the left iliac bone with sequela of chronic pathologic fracture is unchanged. Paraspinal and other soft tissues: The paraspinal soft tissues are unremarkable. Please refer to the separately reported abdomen and pelvic CT for findings in that  region. Disc levels: There is circumferential disc bulge bilateral facet hypertrophy at L2-3, L3-4, and L4-5, with mild central canal stenosis. The neural foramina are grossly patent at L2-3 and L3-4, with left predominant neural foraminal narrowing at L4-5. Reconstructed images demonstrate no additional findings. IMPRESSION: 1. No acute or destructive lesions within the thoracolumbar spine. 2. Mild diffuse thoracic spondylosis without compressive sequela. 3. Multilevel lumbar spondylosis and facet hypertrophy, most pronounced from L2-3 through L4-5. 4. Mixed sclerotic and lytic lesion within the left iliac bone again noted, compatible with known bony metastases. 5. Please refer to separately reported CT chest, abdomen, and pelvis exams for important findings in those regions. Electronically Signed   By: Sharlet Salina M.D.   On: 01/30/2024 19:27   DG Chest Portable 1 View Result Date: 01/30/2024 CLINICAL DATA:  Cough, congestion, weakness. EXAM: PORTABLE CHEST 1 VIEW COMPARISON:  09/10/2022. FINDINGS: There are stable linear opacities scattered throughout bilateral lungs, which may represent scarring/atelectasis. Bilateral lung fields are otherwise clear. Bilateral costophrenic angles are clear. Normal cardio-mediastinal silhouette. No acute osseous abnormalities. The soft tissues are within normal limits. Right-sided CT Port-A-Cath is seen with its tip overlying the midportion of superior vena cava, unchanged. IMPRESSION: No active disease. Electronically Signed   By: Jules Schick M.D.   On: 01/30/2024 15:07   Assessment/Plan Cindy Byrd is a 83 y.o. female with medical history significant for PE/DVT previously on Eliquis, metastatic lung cancer currently on surveillance, GERD, depression, HSV, cornea ulcer of the right eye and emphysema  who was sent from her PCP office for evaluation of hypoxia and hypotension and found to have acute PE and UTI.  # Acute pulmonary embolism Patient with history of  metastatic lung cancer and PE/DVT presents to the ED after being found hypoxic at her PCP office off her home oxygen. She reports recent dyspnea on exertion but no chest pain or dizziness.  She is found to have acute right lower lobe segmental PE with minimal clot burden and no right heart strain.  This is in the setting of patient being off her Eliquis for the last 5 months.  Hypotension resolved with IV fluid. She is hemodynamically stable with SBP in the 110s to 130s. -Admit to progressive bed -Continue IV heparin and transition to Eliquis tomorrow -Check echocardiogram and BLE Dopplers -Continue supplemental O2  # UTI Patient reported a few days of dysuria as well as decreased urine output.  UA not too convincing for UTI however patient does have evidence of perivesicular stranding on CT. -Continue IV Rocephin -Follow-up urine culture -Trend CBC, fever curve  # AKI Patient found to have a creatinine of 1.27. Likely ATN in the setting of recent hypotension from dehydration. Status post IV LR 1 L bolus with improvement in BP. -IV LR 100 cc/h for 10 hours -Trend renal function -Avoid nephrotoxic's agents  # Emphysema # Chronic hypoxic respiratory failure Patient found to be hypoxic at her PCP office however patient was off her home oxygen.  She remains on her baseline 3 L [place] with O2 sats in the low to mid 90s. -Continue bronchodilators -Continue supplemental O2  # Metastatic lung cancer Known metastatic non-small cell lung cancer to her bones and adenocarcinoma of the esophagus. Imaging shows stable mixed lytic and sclerotic osseous metastasis of the left iliac bone. -Continue home fentanyl patch, duloxetine and gabapentin  # Depression -Continue Wellbutrin and duloxetine  # Insomnia -Continue on trazodone as needed for sleep  # Hx herpes zoster infection # Postherpetic neuralgia -Continue valacyclovir -Continue fentanyl patch, gabapentin and duloxetine  # GERD -PPI  DVT  prophylaxis: Heparin    Code Status: Limited: Do not attempt resuscitation (DNR) -DNR-LIMITED -Do Not Intubate/DNI   Consults called: None  Family Communication: Discussed admission with son and friend at bedside  Severity of Illness: The appropriate patient status for this patient is INPATIENT. Inpatient status is judged to be reasonable and necessary in order to provide the required intensity of service to ensure the patient's safety. The patient's presenting symptoms, physical exam findings, and initial radiographic and laboratory data in the context of their chronic comorbidities is felt to place them at high risk for further clinical deterioration. Furthermore, it is not anticipated that the patient will be medically stable for discharge from the hospital within 2 midnights of admission.   * I certify that at the point of admission it is my clinical judgment that the patient will require inpatient hospital care spanning beyond 2 midnights from the point of admission due to high intensity of service, high risk for further deterioration and high frequency of surveillance required.*  Level of care: Progressive   Steffanie Rainwater, MD 01/31/2024, 12:14 AM [facility] Pager: [phone] Isaiah 41:10   If 7PM-7AM, please contact night-coverage [email or web address] Password [email or web address]

## 2024-01-31 NOTE — ED Notes (Signed)
 ED TO INPATIENT HANDOFF REPORT  ED Nurse Name and Phone #: Jeena Arnett rn  S Name/Age/Gender Cindy Byrd 83 y.o. female Room/Bed: WA02/WA02  Code Status   Code Status: Limited: Do not attempt resuscitation (DNR) -DNR-LIMITED -Do Not Intubate/DNI   Home/SNF/Other Home Patient oriented to: self, place, time, and situation Is this baseline? Yes   Triage Complete: Triage complete  Chief Complaint Acute pulmonary embolism (HCC) [I26.99]  Triage Note Patient presents due to low SPO2 , low BP and malaise the last 2 weeks. She is concerned that she may have a UTI. She does not believe she is urinating as often as she should. She reports urinating 1-2 times a day and it is a little painful. She also complains of worsening back pain that started about 2 weeks ago.   Chronic 3 L O2 use at home, but has not had her O2 recently.    Allergies Allergies  Allergen Reactions   Penicillins Other (See Comments)    Unknown; childhood allergy - details unknown Tolerates Ancef   Remeron [Mirtazapine] Other (See Comments)    nightmares   Latex Rash    Level of Care/Admitting Diagnosis ED Disposition     ED Disposition  Admit   Condition  --   Comment  Hospital Area: [facility] [[id number]]  Level of Care: Progressive [102]  Admit to Progressive based on following criteria: CARDIOVASCULAR & THORACIC of moderate stability with acute coronary syndrome symptoms/low risk myocardial infarction/hypertensive urgency/arrhythmias/heart failure potentially compromising stability and stable post cardiovascular intervention patients.  Admit to Progressive based on following criteria: RESPIRATORY PROBLEMS hypoxemic/hypercapnic respiratory failure that is responsive to NIPPV (BiPAP) or High Flow Nasal Cannula (6-80 lpm). Frequent assessment/intervention, no > Q2 hrs < Q4 hrs, to maintain oxygenation and pulmonary hygiene.  May admit patient to [facility] or [facility] if  equivalent level of care is available:: No  Covid Evaluation: Asymptomatic - no recent exposure (last 10 days) testing not required  Diagnosis: Acute pulmonary embolism [facility]) [[id number]]  Admitting Physician: Steffanie Rainwater [[id number]]  Attending Physician: Steffanie Rainwater [[id number]]  Certification:: I certify this patient will need inpatient services for at least 2 midnights  Expected Medical Readiness: 02/01/2024          B Medical/Surgery History Past Medical History:  Diagnosis Date   Barrett's esophagus    Bilateral pulmonary embolism (HCC) 09/02/2016   09/02/16 bilateral pulmonary emboli in context of extensive bilateral lower extremity deep venous thromboses Assumed hypercoagulability due to non-small cell metastatic lung cancer Lifelong anticoagulation recommended   Bone neoplasm 06/24/2015   Cancer (HCC)    metastatic poorly differentiated carcinoma. tumor left groin surgical removal with radiation tx.   Cataract    BILATERAL   Cigarette smoker two packs a day or less    Currently still smoking 2 PPD - Not interested in quitting at this time.   Colitis 2017   Colon polyps    hyperplastic, tubular adenomas, tubulovillous adenoma   Cough, persistent    hx. lung cancer ? primary-being evaluated, unsure of primary site.   Depression 06/24/2015   Diverticulosis    DVT of lower extremity, bilateral (HCC) 09/02/2016   Dysrhythmia    PAF in setting of submassive PE 09/2016   Emphysema of lung (HCC)    Endometriosis    Hysterectomy with BSO at age 67 yrs   Esophageal adenocarcinoma (HCC) 08/11/2015   intramucosal   Gastritis    GERD (gastroesophageal reflux disease)  H/O: pneumonia    Hiatal hernia    Hyperglycemia    A1c 7.4% 06/20/22   Hyperlipidemia    Hypertension 06/24/2015   likely improved incidental to 40 lbs weight loss from her neoplasm. No Longer taking med for this as of 08-06-15   IBS (irritable bowel syndrome)    Lung cancer (HCC)    suspected IV  lung cancer with left ileum mets 2017, s/p left ileum radiaiton, immunotherapy   Pain    left hip-persistent"tumor of bone"-radiation tx. 10.   Pneumonia    Vitamin D deficiency disease    Past Surgical History:  Procedure Laterality Date   ABDOMINAL HYSTERECTOMY     BALLOON DILATION N/A 10/08/2019   Procedure: BALLOON DILATION;  Surgeon: Beverley Fiedler, MD;  Location: [facility];  Service: Gastroenterology;  Laterality: N/A;   BARTHOLIN GLAND CYST EXCISION  83 yo ago   Does not want if it was an infected cyst or tumor. Was soon as delivery   BIOPSY  01/02/2019   Procedure: BIOPSY;  Surgeon: Beverley Fiedler, MD;  Location: [facility];  Service: Gastroenterology;;   CATARACT EXTRACTION     COLONOSCOPY W/ POLYPECTOMY     multiple times - last done 09/2014 per patient.   ESOPHAGOGASTRODUODENOSCOPY (EGD) WITH PROPOFOL N/A 08/11/2015   Procedure: ESOPHAGOGASTRODUODENOSCOPY (EGD) WITH PROPOFOL;  Surgeon: Beverley Fiedler, MD;  Location: [facility];  Service: Gastroenterology;  Laterality: N/A;   ESOPHAGOGASTRODUODENOSCOPY (EGD) WITH PROPOFOL N/A 01/02/2019   Procedure: ESOPHAGOGASTRODUODENOSCOPY (EGD) WITH PROPOFOL;  Surgeon: Beverley Fiedler, MD;  Location: [facility];  Service: Gastroenterology;  Laterality: N/A;   ESOPHAGOGASTRODUODENOSCOPY (EGD) WITH PROPOFOL N/A 10/08/2019   Procedure: ESOPHAGOGASTRODUODENOSCOPY (EGD) WITH PROPOFOL;  Surgeon: Beverley Fiedler, MD;  Location: [facility];  Service: Gastroenterology;  Laterality: N/A;   EYE SURGERY Right 2024   Multiple surgeries over the past year.   FLEXIBLE SIGMOIDOSCOPY N/A 06/24/2017   Procedure: FLEXIBLE SIGMOIDOSCOPY;  Surgeon: Ruffin Frederick, MD;  Location: [facility];  Service: Gastroenterology;  Laterality: N/A;   GANGLION CYST EXCISION     KNEE ARTHROSCOPY  age about 41 yrs   TONSILLECTOMY     TOTAL ABDOMINAL HYSTERECTOMY W/ BILATERAL SALPINGOOPHORECTOMY  at age 46 yrs   For endometriosis   VIDEO BRONCHOSCOPY WITH  ENDOBRONCHIAL ULTRASOUND Bilateral 06/06/2023   Procedure: VIDEO BRONCHOSCOPY WITH ENDOBRONCHIAL ULTRASOUND;  Surgeon: Josephine Igo, DO;  Location: [facility];  Service: Cardiopulmonary;  Laterality: Bilateral;     A IV Location/Drains/Wounds Patient Lines/Drains/Airways Status     Active Line/Drains/Airways     Name Placement date Placement time Site Days   Implanted Port 08/18/15 Right Chest 08/18/15  1509  Chest  3088   Peripheral IV 01/30/24 20 G Right Antecubital 01/30/24  1716  Antecubital  1   Peripheral IV 01/31/24 20 G Anterior;Left Forearm 01/31/24  0140  Forearm  less than 1   Pressure Injury 09/01/16 Stage I -  Intact skin with non-blanchable redness of a localized area usually over a bony prominence. 09/01/16  2205  -- 2708   Pressure Injury 09/01/16 Stage II -  Partial thickness loss of dermis presenting as a shallow open ulcer with a red, pink wound bed without slough. 09/01/16  2205  -- 2708            Intake/Output Last 24 hours No intake or output data in the 24 hours ending 01/31/24 1610  Labs/Imaging Results for orders placed or performed during the hospital encounter of 01/30/24 (from  the past 48 hours)  Urinalysis, Routine w reflex microscopic -Urine, Clean Catch     Status: Abnormal   Collection Time: 01/30/24  1:15 PM  Result Value Ref Range   Color, Urine AMBER (A) YELLOW    Comment: BIOCHEMICALS MAY BE AFFECTED BY COLOR   APPearance HAZY (A) CLEAR   Specific Gravity, Urine 1.027 1.005 - 1.030   pH 5.0 5.0 - 8.0   Glucose, UA NEGATIVE NEGATIVE mg/dL   Hgb urine dipstick NEGATIVE NEGATIVE   Bilirubin Urine SMALL (A) NEGATIVE   Ketones, ur NEGATIVE NEGATIVE mg/dL   Protein, ur 30 (A) NEGATIVE mg/dL   Nitrite NEGATIVE NEGATIVE   Leukocytes,Ua TRACE (A) NEGATIVE   RBC / HPF 0-5 0 - 5 RBC/hpf   WBC, UA 0-5 0 - 5 WBC/hpf   Bacteria, UA RARE (A) NONE SEEN   Squamous Epithelial / HPF 0-5 0 - 5 /HPF   Mucus PRESENT    Hyaline Casts, UA PRESENT     Granular Casts, UA PRESENT     Comment: Performed at [facility], [address], [place], [place] [address]  CBC with Differential     Status: None   Collection Time: 01/30/24  2:18 PM  Result Value Ref Range   WBC 5.7 4.0 - 10.5 K/uL   RBC 4.26 3.87 - 5.11 MIL/uL   Hemoglobin 13.1 12.0 - 15.0 g/dL   HCT 13.0 86.5 - 78.4 %   MCV 97.7 80.0 - 100.0 fL   MCH 30.8 26.0 - 34.0 pg   MCHC 31.5 30.0 - 36.0 g/dL   RDW 69.6 29.5 - 28.4 %   Platelets 280 150 - 400 K/uL   nRBC 0.0 0.0 - 0.2 %   Neutrophils Relative % 49 %   Neutro Abs 2.8 1.7 - 7.7 K/uL   Lymphocytes Relative 32 %   Lymphs Abs 1.8 0.7 - 4.0 K/uL   Monocytes Relative 14 %   Monocytes Absolute 0.8 0.1 - 1.0 K/uL   Eosinophils Relative 4 %   Eosinophils Absolute 0.2 0.0 - 0.5 K/uL   Basophils Relative 1 %   Basophils Absolute 0.1 0.0 - 0.1 K/uL   Immature Granulocytes 0 %   Abs Immature Granulocytes 0.01 0.00 - 0.07 K/uL    Comment: Performed at [facility], [address], [place], [place] [address]  Comprehensive metabolic panel     Status: Abnormal   Collection Time: 01/30/24  2:18 PM  Result Value Ref Range   Sodium 137 135 - 145 mmol/L   Potassium 4.7 3.5 - 5.1 mmol/L   Chloride 102 98 - 111 mmol/L   CO2 27 22 - 32 mmol/L   Glucose, Bld 122 (H) 70 - 99 mg/dL    Comment: Glucose reference range applies only to samples taken after fasting for at least 8 hours.   BUN 22 8 - 23 mg/dL   Creatinine, Ser 0.10 (H) 0.44 - 1.00 mg/dL   Calcium 8.8 (L) 8.9 - 10.3 mg/dL   Total Protein 7.6 6.5 - 8.1 g/dL   Albumin 3.0 (L) 3.5 - 5.0 g/dL   AST 25 15 - 41 U/L   ALT 12 0 - 44 U/L   Alkaline Phosphatase 61 38 - 126 U/L   Total Bilirubin 0.4 0.0 - 1.2 mg/dL   GFR, Estimated 42 (L) >60 mL/min    Comment: (NOTE) Calculated using the CKD-EPI Creatinine Equation (2021)    Anion gap 8 5 - 15  Comment: Performed at [facility], [address] [place], [place] [address]   Troponin I (High Sensitivity)     Status: None   Collection Time: 01/30/24  2:18 PM  Result Value Ref Range   Troponin I (High Sensitivity) 10 <18 ng/L    Comment: (NOTE) Elevated high sensitivity troponin I (hsTnI) values and significant  changes across serial measurements may suggest ACS but many other  chronic and acute conditions are known to elevate hsTnI results.  Refer to the "Links" section for chest pain algorithms and additional  guidance. Performed at [facility], [address], [place], [place] [address]   Brain natriuretic peptide     Status: Abnormal   Collection Time: 01/30/24  5:10 PM  Result Value Ref Range   B Natriuretic Peptide 140.7 (H) 0.0 - 100.0 pg/mL    Comment: Performed at [facility], [address] [place], [place] [address]  TSH     Status: None   Collection Time: 01/30/24  5:10 PM  Result Value Ref Range   TSH 2.860 0.350 - 4.500 uIU/mL    Comment: Performed by a 3rd Generation assay with a functional sensitivity of <=0.01 uIU/mL. Performed at [facility], [address], [place], [place] [address]   Troponin I (High Sensitivity)     Status: None   Collection Time: 01/30/24  7:45 PM  Result Value Ref Range   Troponin I (High Sensitivity) 8 <18 ng/L    Comment: (NOTE) Elevated high sensitivity troponin I (hsTnI) values and significant  changes across serial measurements may suggest ACS but many other  chronic and acute conditions are known to elevate hsTnI results.  Refer to the "Links" section for chest pain algorithms and additional  guidance. Performed at [facility], [address], [place], [place] [address]   Heparin level (unfractionated)     Status: Abnormal   Collection Time: 01/31/24  4:32 AM  Result Value Ref Range   Heparin Unfractionated >1.10 (H) 0.30 - 0.70 IU/mL    Comment: (NOTE) The clinical reportable range upper limit is being lowered to  >1.10 to align with the FDA approved guidance for the current laboratory assay.  If heparin results are below expected values, and patient dosage has  been confirmed, suggest follow up testing of antithrombin III levels. Performed at [facility], [address], [place], [place] [address]   APTT     Status: Abnormal   Collection Time: 01/31/24  4:33 AM  Result Value Ref Range   aPTT >200 (HH) 24 - 36 seconds    Comment:        IF BASELINE aPTT IS ELEVATED, SUGGEST PATIENT RISK ASSESSMENT BE USED TO DETERMINE APPROPRIATE ANTICOAGULANT THERAPY. CRITICAL RESULT CALLED TO, READ BACK BY AND VERIFIED WITH: Clydene Laming, RN  01/31/24 0548 BY K. DAVIS Performed at [facility], [address], [place], [place] [address]   CBC     Status: Abnormal   Collection Time: 01/31/24  4:33 AM  Result Value Ref Range   WBC 12.0 (H) 4.0 - 10.5 K/uL   RBC 4.20 3.87 - 5.11 MIL/uL   Hemoglobin 13.1 12.0 - 15.0 g/dL   HCT 96.2 95.2 - 84.1 %   MCV 97.1 80.0 - 100.0 fL   MCH 31.2 26.0 - 34.0 pg   MCHC 32.1 30.0 - 36.0 g/dL   RDW 32.4 40.1 - 02.7 %   Platelets 257 150 -  400 K/uL   nRBC 0.0 0.0 - 0.2 %    Comment: Performed at [facility], [address] [place], [place] [address]  Basic metabolic panel     Status: Abnormal   Collection Time: 01/31/24  4:33 AM  Result Value Ref Range   Sodium 137 135 - 145 mmol/L   Potassium 4.6 3.5 - 5.1 mmol/L   Chloride 103 98 - 111 mmol/L   CO2 26 22 - 32 mmol/L   Glucose, Bld 96 70 - 99 mg/dL    Comment: Glucose reference range applies only to samples taken after fasting for at least 8 hours.   BUN 20 8 - 23 mg/dL   Creatinine, Ser 6.04 (H) 0.44 - 1.00 mg/dL   Calcium 7.9 (L) 8.9 - 10.3 mg/dL   GFR, Estimated 50 (L) >60 mL/min    Comment: (NOTE) Calculated using the CKD-EPI Creatinine Equation (2021)    Anion gap 8 5 - 15    Comment: Performed at [facility], [address] [place], [place] [address]   *Note: Due to a large number of results and/or encounters for the requested time period, some results have not been displayed. A complete set of results can be found in Results Review.   CT Angio Chest PE W and/or Wo Contrast Addendum Date: 01/30/2024 ADDENDUM REPORT: 01/30/2024 21:50 ADDENDUM: In the impression, an additional item should have been included under the chest section: 5. New 9 mm mean diameter left upper lobe pulmonary nodule, concerning for metastatic disease. Electronically Signed   By: Sharlet Salina M.D.   On: 01/30/2024 21:50   Result Date: 01/30/2024 CLINICAL DATA:  Non-small cell lung cancer, esophageal cancer, history of skeletal metastases, hypoxia, hypotension, malaise, decreased urination EXAM: CT ANGIOGRAPHY CHEST CT ABDOMEN AND PELVIS WITH CONTRAST TECHNIQUE: Multidetector CT imaging of the chest was performed using the standard protocol during bolus administration of intravenous contrast. Multiplanar CT image reconstructions and MIPs were obtained to evaluate the vascular anatomy. Multidetector CT imaging of the abdomen and pelvis was performed using the standard protocol during bolus administration of intravenous contrast. RADIATION DOSE REDUCTION: This exam was performed according to the departmental dose-optimization program which includes automated exposure control, adjustment of the mA and/or kV according to patient size and/or use of iterative reconstruction technique. CONTRAST:  80mL OMNIPAQUE IOHEXOL 350 MG/ML SOLN COMPARISON:  01/23/2024, 09/19/2023, 05/04/2023 FINDINGS: CTA CHEST FINDINGS Cardiovascular: This is a technically adequate evaluation of the pulmonary vasculature. Stable sequela of chronic thromboembolic disease within synechia in the right upper lobe, and nonocclusive the herein clot within the proximal left upper and left lower lobe pulmonary arteries, without change since prior study. There are acute segmental right lower lobe  pulmonary emboli identified on this exam, with minimal clot burden. Stable enlarged main pulmonary artery consistent with chronic pulmonary arterial hypertension. The heart is unremarkable without pericardial effusion. Normal caliber of the thoracic aorta. Stable atherosclerosis of the aorta, with significant plaque involving the distal aortic arch without critical stenosis. No evidence of dissection. Right chest wall port via internal jugular approach, tip within the superior vena cava. Mediastinum/Nodes: No enlarged mediastinal, hilar, or axillary lymph nodes. Thyroid gland, trachea, and esophagus demonstrate no significant findings. Lungs/Pleura: Stable emphysema. No acute airspace disease, effusion, or pneumothorax. Mild bilateral bronchial wall thickening, greatest in the lower lobes. Fluid-filled segmental left lower lobe bronchi are identified, which may be due to retained secretions or aspiration. Since the 09/19/2023 exam, a 10 x 8 mm  left upper lobe pulmonary nodule has developed, reference image 69/13. Musculoskeletal: Multiple prior healed left rib fractures are noted. There are no acute or destructive bony abnormalities. Reconstructed images demonstrate no additional findings. Review of the MIP images confirms the above findings. CT ABDOMEN and PELVIS FINDINGS Hepatobiliary: Multiple small hypodensities are again seen throughout the liver, unchanged, and most consistent with cysts or hemangiomas. Multiple small gallstones without cholecystitis. No biliary duct dilation. Pancreas: Unremarkable. No pancreatic ductal dilatation or surrounding inflammatory changes. Spleen: Stable splenic atrophy with areas of capsular contraction and scarring. Adrenals/Urinary Tract: Adrenal glands are unremarkable. Kidneys are normal, without renal calculi, focal lesion, or hydronephrosis. The bladder is only minimally distended, limiting its evaluation. There is mild perivesicular fat stranding, and underlying cystitis  cannot be excluded. Stomach/Bowel: No bowel obstruction or ileus. Normal appendix right lower quadrant. Diverticulosis of the sigmoid colon without evidence of acute diverticulitis. No bowel wall thickening or inflammatory change. Vascular/Lymphatic: Aortic atherosclerosis. No enlarged abdominal or pelvic lymph nodes. Reproductive: Status post hysterectomy. No adnexal masses. Other: No free fluid or free intraperitoneal gas. No abdominal wall hernia. 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 No acute displaced fracture. Reconstructed images demonstrate no additional findings. Review of the MIP images confirms the above findings. IMPRESSION: Chest: 1. Acute right lower lobe segmental pulmonary emboli. Minimal clot burden with no evidence of right heart strain. 2. Stable sequela of chronic pulmonary thromboembolic disease, with thin synechia in the right upper lobe and chronic adherent nonocclusive thrombus within the left upper and left lower lobe pulmonary arteries origins. 3. Mild bilateral bronchial wall thickening, greatest in the lower lobes. Fluid-filled segmental left lower lobe bronchi. Findings likely reflect bronchitis and retained secretions. 4. Aortic Atherosclerosis (ICD10-I70.0) and Emphysema (ICD10-J43.9). Abdomen/pelvis: 1. 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 2. No other signs of intra-abdominal or intrapelvic metastases. 3. Sigmoid diverticulosis without diverticulitis. 4. Cholelithiasis without cholecystitis. 5.  Aortic Atherosclerosis (ICD10-I70.0). Critical Value/emergent results were called by telephone at the time of interpretation on 01/30/2024 at 7:22 pm to provider [facility] , who verbally acknowledged these results. Electronically Signed: By: Sharlet Salina M.D. On: 01/30/2024 19:34   CT ABDOMEN PELVIS W CONTRAST Addendum Date: 01/30/2024 ADDENDUM REPORT: 01/30/2024 21:50 ADDENDUM: In the  impression, an additional item should have been included under the chest section: 5. New 9 mm mean diameter left upper lobe pulmonary nodule, concerning for metastatic disease. Electronically Signed   By: Sharlet Salina M.D.   On: 01/30/2024 21:50   Result Date: 01/30/2024 CLINICAL DATA:  Non-small cell lung cancer, esophageal cancer, history of skeletal metastases, hypoxia, hypotension, malaise, decreased urination EXAM: CT ANGIOGRAPHY CHEST CT ABDOMEN AND PELVIS WITH CONTRAST TECHNIQUE: Multidetector CT imaging of the chest was performed using the standard protocol during bolus administration of intravenous contrast. Multiplanar CT image reconstructions and MIPs were obtained to evaluate the vascular anatomy. Multidetector CT imaging of the abdomen and pelvis was performed using the standard protocol during bolus administration of intravenous contrast. RADIATION DOSE REDUCTION: This exam was performed according to the departmental dose-optimization program which includes automated exposure control, adjustment of the mA and/or kV according to patient size and/or use of iterative reconstruction technique. CONTRAST:  80mL OMNIPAQUE IOHEXOL 350 MG/ML SOLN COMPARISON:  01/23/2024, 09/19/2023, 05/04/2023 FINDINGS: CTA CHEST FINDINGS Cardiovascular: This is a technically adequate evaluation of the pulmonary vasculature. Stable sequela of chronic thromboembolic disease within synechia in the right upper lobe, and nonocclusive  the herein clot within the proximal left upper and left lower lobe pulmonary arteries, without change since prior study. There are acute segmental right lower lobe pulmonary emboli identified on this exam, with minimal clot burden. Stable enlarged main pulmonary artery consistent with chronic pulmonary arterial hypertension. The heart is unremarkable without pericardial effusion. Normal caliber of the thoracic aorta. Stable atherosclerosis of the aorta, with significant plaque involving the distal  aortic arch without critical stenosis. No evidence of dissection. Right chest wall port via internal jugular approach, tip within the superior vena cava. Mediastinum/Nodes: No enlarged mediastinal, hilar, or axillary lymph nodes. Thyroid gland, trachea, and esophagus demonstrate no significant findings. Lungs/Pleura: Stable emphysema. No acute airspace disease, effusion, or pneumothorax. Mild bilateral bronchial wall thickening, greatest in the lower lobes. Fluid-filled segmental left lower lobe bronchi are identified, which may be due to retained secretions or aspiration. Since the 09/19/2023 exam, a 10 x 8 mm left upper lobe pulmonary nodule has developed, reference image 69/13. Musculoskeletal: Multiple prior healed left rib fractures are noted. There are no acute or destructive bony abnormalities. Reconstructed images demonstrate no additional findings. Review of the MIP images confirms the above findings. CT ABDOMEN and PELVIS FINDINGS Hepatobiliary: Multiple small hypodensities are again seen throughout the liver, unchanged, and most consistent with cysts or hemangiomas. Multiple small gallstones without cholecystitis. No biliary duct dilation. Pancreas: Unremarkable. No pancreatic ductal dilatation or surrounding inflammatory changes. Spleen: Stable splenic atrophy with areas of capsular contraction and scarring. Adrenals/Urinary Tract: Adrenal glands are unremarkable. Kidneys are normal, without renal calculi, focal lesion, or hydronephrosis. The bladder is only minimally distended, limiting its evaluation. There is mild perivesicular fat stranding, and underlying cystitis cannot be excluded. Stomach/Bowel: No bowel obstruction or ileus. Normal appendix right lower quadrant. Diverticulosis of the sigmoid colon without evidence of acute diverticulitis. No bowel wall thickening or inflammatory change. Vascular/Lymphatic: Aortic atherosclerosis. No enlarged abdominal or pelvic lymph nodes. Reproductive: Status  post hysterectomy. No adnexal masses. Other: No free fluid or free intraperitoneal gas. No abdominal wall hernia. 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 No acute displaced fracture. Reconstructed images demonstrate no additional findings. Review of the MIP images confirms the above findings. IMPRESSION: Chest: 1. Acute right lower lobe segmental pulmonary emboli. Minimal clot burden with no evidence of right heart strain. 2. Stable sequela of chronic pulmonary thromboembolic disease, with thin synechia in the right upper lobe and chronic adherent nonocclusive thrombus within the left upper and left lower lobe pulmonary arteries origins. 3. Mild bilateral bronchial wall thickening, greatest in the lower lobes. Fluid-filled segmental left lower lobe bronchi. Findings likely reflect bronchitis and retained secretions. 4. Aortic Atherosclerosis (ICD10-I70.0) and Emphysema (ICD10-J43.9). Abdomen/pelvis: 1. 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 2. No other signs of intra-abdominal or intrapelvic metastases. 3. Sigmoid diverticulosis without diverticulitis. 4. Cholelithiasis without cholecystitis. 5.  Aortic Atherosclerosis (ICD10-I70.0). Critical Value/emergent results were called by telephone at the time of interpretation on 01/30/2024 at 7:22 pm to provider [facility] , who verbally acknowledged these results. Electronically Signed: By: Sharlet Salina M.D. On: 01/30/2024 19:34   CT T-SPINE NO CHARGE Result Date: 01/30/2024 CLINICAL DATA:  Metastatic lung cancer, hypoxia, back pain for 2 weeks EXAM: CT Thoracic and Lumbar spine with contrast TECHNIQUE: Multiplanar CT images of the thoracic and lumbar spine were reconstructed from contemporary CT of the Chest, Abdomen, and Pelvis. RADIATION DOSE REDUCTION: This exam was performed according to the departmental dose-optimization  program which includes automated exposure  control, adjustment of the mA and/or kV according to patient size and/or use of iterative reconstruction technique. CONTRAST:  No additional COMPARISON:  09/21/2023, 09/19/2023, 06/15/2023 FINDINGS: CT THORACIC SPINE FINDINGS Alignment: Alignment is anatomic. Vertebrae: There are no acute displaced fractures. No destructive bony lesions. Paraspinal and other soft tissues: The paraspinal soft tissues are unremarkable. Please refer to separately reported chest CT exam describing acute and chronic pulmonary emboli. Disc levels: There is mild diffuse thoracic spondylosis. No bony encroachment upon the central canal or neural foramina. CT LUMBAR SPINE FINDINGS Segmentation: 5 lumbar type vertebrae. Alignment: Right convex scoliosis centered at L3. Otherwise alignment is anatomic. Vertebrae: There are no acute fractures or destructive lesions within the lumbar spine. The mixed lytic and sclerotic metastatic lesion within the left iliac bone with sequela of chronic pathologic fracture is unchanged. Paraspinal and other soft tissues: The paraspinal soft tissues are unremarkable. Please refer to the separately reported abdomen and pelvic CT for findings in that region. Disc levels: There is circumferential disc bulge bilateral facet hypertrophy at L2-3, L3-4, and L4-5, with mild central canal stenosis. The neural foramina are grossly patent at L2-3 and L3-4, with left predominant neural foraminal narrowing at L4-5. Reconstructed images demonstrate no additional findings. IMPRESSION: 1. No acute or destructive lesions within the thoracolumbar spine. 2. Mild diffuse thoracic spondylosis without compressive sequela. 3. Multilevel lumbar spondylosis and facet hypertrophy, most pronounced from L2-3 through L4-5. 4. Mixed sclerotic and lytic lesion within the left iliac bone again noted, compatible with known bony metastases. 5. Please refer to separately reported CT chest, abdomen, and pelvis exams for important findings in those  regions. Electronically Signed   By: Sharlet Salina M.D.   On: 01/30/2024 19:27   CT L-SPINE NO CHARGE Result Date: 01/30/2024 CLINICAL DATA:  Metastatic lung cancer, hypoxia, back pain for 2 weeks EXAM: CT Thoracic and Lumbar spine with contrast TECHNIQUE: Multiplanar CT images of the thoracic and lumbar spine were reconstructed from contemporary CT of the Chest, Abdomen, and Pelvis. RADIATION DOSE REDUCTION: This exam was performed according to the departmental dose-optimization program which includes automated exposure control, adjustment of the mA and/or kV according to patient size and/or use of iterative reconstruction technique. CONTRAST:  No additional COMPARISON:  09/21/2023, 09/19/2023, 06/15/2023 FINDINGS: CT THORACIC SPINE FINDINGS Alignment: Alignment is anatomic. Vertebrae: There are no acute displaced fractures. No destructive bony lesions. Paraspinal and other soft tissues: The paraspinal soft tissues are unremarkable. Please refer to separately reported chest CT exam describing acute and chronic pulmonary emboli. Disc levels: There is mild diffuse thoracic spondylosis. No bony encroachment upon the central canal or neural foramina. CT LUMBAR SPINE FINDINGS Segmentation: 5 lumbar type vertebrae. Alignment: Right convex scoliosis centered at L3. Otherwise alignment is anatomic. Vertebrae: There are no acute fractures or destructive lesions within the lumbar spine. The mixed lytic and sclerotic metastatic lesion within the left iliac bone with sequela of chronic pathologic fracture is unchanged. Paraspinal and other soft tissues: The paraspinal soft tissues are unremarkable. Please refer to the separately reported abdomen and pelvic CT for findings in that region. Disc levels: There is circumferential disc bulge bilateral facet hypertrophy at L2-3, L3-4, and L4-5, with mild central canal stenosis. The neural foramina are grossly patent at L2-3 and L3-4, with left predominant neural foraminal  narrowing at L4-5. Reconstructed images demonstrate no additional findings. IMPRESSION: 1. No acute or destructive lesions within the thoracolumbar spine. 2. Mild diffuse thoracic spondylosis without compressive sequela.  3. Multilevel lumbar spondylosis and facet hypertrophy, most pronounced from L2-3 through L4-5. 4. Mixed sclerotic and lytic lesion within the left iliac bone again noted, compatible with known bony metastases. 5. Please refer to separately reported CT chest, abdomen, and pelvis exams for important findings in those regions. Electronically Signed   By: Sharlet Salina M.D.   On: 01/30/2024 19:27   DG Chest Portable 1 View Result Date: 01/30/2024 CLINICAL DATA:  Cough, congestion, weakness. EXAM: PORTABLE CHEST 1 VIEW COMPARISON:  09/10/2022. FINDINGS: There are stable linear opacities scattered throughout bilateral lungs, which may represent scarring/atelectasis. Bilateral lung fields are otherwise clear. Bilateral costophrenic angles are clear. Normal cardio-mediastinal silhouette. No acute osseous abnormalities. The soft tissues are within normal limits. Right-sided CT Port-A-Cath is seen with its tip overlying the midportion of superior vena cava, unchanged. IMPRESSION: No active disease. Electronically Signed   By: Jules Schick M.D.   On: 01/30/2024 15:07    Pending Labs Unresulted Labs (From admission, onward)     Start     Ordered   01/31/24 0615  Heparin level (unfractionated)  ONCE - STAT,   STAT       Comments: Draw from arm opposite heparin infusion    01/31/24 0614   01/30/24 1719  Urine Culture  Once,   URGENT       Question:  Indication  Answer:  Dysuria   01/30/24 1718            Vitals/Pain Today's Vitals   01/30/24 2300 01/31/24 0115 01/31/24 0400 01/31/24 0535  BP: (!) 122/56 133/65 (!) 157/68 100/82  Pulse: 73 75 90 90  Resp: 18 (!) 23 (!) 28 18  Temp:  98.1 F (36.7 C)  99.6 F (37.6 C)  TempSrc:      SpO2: 92% 98% 97% 98%  PainSc:         Isolation Precautions No active isolations  Medications Medications  heparin ADULT infusion 100 units/mL (25000 units/267mL) (1,000 Units/hr Intravenous New Bag/Given 01/30/24 2034)  acetaminophen (TYLENOL) tablet 650 mg (has no administration in time range)    Or  acetaminophen (TYLENOL) suppository 650 mg (has no administration in time range)  senna-docusate (Senokot-S) tablet 1 tablet (has no administration in time range)  ondansetron (ZOFRAN) tablet 4 mg (has no administration in time range)    Or  ondansetron (ZOFRAN) injection 4 mg (has no administration in time range)  Budeson-Glycopyrrol-Formoterol 160-9-4.8 MCG/ACT AERO (has no administration in time range)  buPROPion (WELLBUTRIN XL) 24 hr tablet 150 mg (has no administration in time range)  DULoxetine (CYMBALTA) DR capsule 90 mg (has no administration in time range)  gabapentin (NEURONTIN) capsule 600 mg (600 mg Oral Given 01/30/24 2322)  pantoprazole (PROTONIX) EC tablet 40 mg (has no administration in time range)  traZODone (DESYREL) tablet 100 mg (has no administration in time range)  valACYclovir (VALTREX) tablet 500 mg (500 mg Oral Given 01/30/24 2322)  fentaNYL (DURAGESIC) 25 MCG/HR 1 patch (has no administration in time range)  lactated ringers infusion ( Intravenous New Bag/Given 01/31/24 0115)  cefTRIAXone (ROCEPHIN) 1 g in sodium chloride 0.9 % 100 mL IVPB (has no administration in time range)  oxyCODONE-acetaminophen (PERCOCET/ROXICET) 5-325 MG per tablet 1 tablet (1 tablet Oral Given 01/30/24 1731)  lactated ringers bolus 1,000 mL (1,000 mLs Intravenous New Bag/Given 01/30/24 1842)  iohexol (OMNIPAQUE) 350 MG/ML injection 80 mL (80 mLs Intravenous Contrast Given 01/30/24 1826)  cefTRIAXone (ROCEPHIN) 1 g in sodium chloride 0.9 % 100 mL IVPB (1  g Intravenous New Bag/Given 01/30/24 2011)  heparin bolus via infusion 3,500 Units (3,500 Units Intravenous Bolus from Bag 01/30/24 2035)    Mobility walks     Focused  Assessments Cardiac Assessment Handoff:    Lab Results  Component Value Date   CKTOTAL 30 06/24/2015   TROPONINI <0.03 03/06/2019   No results found for: "DDIMER" Does the Patient currently have chest pain? No   , Neuro Assessment Handoff:  Swallow screen pass?          Neuro Assessment:   Neuro Checks:      Has TPA been given?  If patient is a Neuro Trauma and patient is going to OR before floor call report to 4N Charge nurse: [phone] or [phone]  , Renal Assessment Handoff:  Hemodialysis Schedule:  Last Hemodialysis date and time:    Restricted appendage:    , Pulmonary Assessment Handoff:  Lung sounds:   O2 Device: Nasal Cannula O2 Flow Rate (L/min): 3 L/min    R Recommendations: See Admitting Provider Note  Report given to:   Additional Notes: hello pt is getting admitted because of uti and pe, pt alert x4, ambulatory, on O2 2l Owen baseline. Not complaining of pain. Heparin going and fluids. Thanks

## 2024-01-31 NOTE — Discharge Instructions (Signed)
 Information on my medicine - ELIQUIS (apixaban)  This medication education was reviewed with me or my healthcare representative as part of my discharge preparation.    Why was Eliquis prescribed for you? Eliquis was prescribed to treat blood clots that may have been found in the veins of your legs (deep vein thrombosis) or in your lungs (pulmonary embolism) and to reduce the risk of them occurring again.  What do You need to know about Eliquis ? The starting dose is 10 mg (two 5 mg tablets) taken TWICE daily for the FIRST SEVEN (7) DAYS, then on 02/07/24 the dose is reduced to ONE 5 mg tablet taken TWICE daily.  Eliquis may be taken with or without food.   Try to take the dose about the same time in the morning and in the evening. If you have difficulty swallowing the tablet whole please discuss with your pharmacist how to take the medication safely.  Take Eliquis exactly as prescribed and DO NOT stop taking Eliquis without talking to the doctor who prescribed the medication.  Stopping may increase your risk of developing a new blood clot.  Refill your prescription before you run out.  After discharge, you should have regular check-up appointments with your healthcare provider that is prescribing your Eliquis.    What do you do if you miss a dose? If a dose of ELIQUIS is not taken at the scheduled time, take it as soon as possible on the same day and twice-daily administration should be resumed. The dose should not be doubled to make up for a missed dose.  Important Safety Information A possible side effect of Eliquis is bleeding. You should call your healthcare provider right away if you experience any of the following: Bleeding from an injury or your nose that does not stop. Unusual colored urine (red or dark brown) or unusual colored stools (red or black). Unusual bruising for unknown reasons. A serious fall or if you hit your head (even if there is no bleeding).  Some medicines  may interact with Eliquis and might increase your risk of bleeding or clotting while on Eliquis. To help avoid this, consult your healthcare provider or pharmacist prior to using any new prescription or non-prescription medications, including herbals, vitamins, non-steroidal anti-inflammatory drugs (NSAIDs) and supplements.  This website has more information on Eliquis (apixaban): http://[email or web address].com/eliquis/home

## 2024-02-01 ENCOUNTER — Encounter: Payer: Self-pay | Admitting: Hematology

## 2024-02-01 ENCOUNTER — Other Ambulatory Visit (HOSPITAL_COMMUNITY): Payer: Self-pay

## 2024-02-01 DIAGNOSIS — I2699 Other pulmonary embolism without acute cor pulmonale: Secondary | ICD-10-CM | POA: Diagnosis not present

## 2024-02-01 MED ORDER — CEFADROXIL 500 MG PO CAPS
500.0000 mg | ORAL_CAPSULE | Freq: Two times a day (BID) | ORAL | 0 refills | Status: AC
  Filled 2024-02-01: qty 6, 3d supply, fill #0

## 2024-02-01 MED ORDER — APIXABAN (ELIQUIS) VTE STARTER PACK (10MG AND 5MG)
ORAL_TABLET | ORAL | 0 refills | Status: DC
  Filled 2024-02-01: qty 74, 30d supply, fill #0

## 2024-02-01 NOTE — Progress Notes (Signed)
 Discharge instructions reviewed with patient. All questions answered. All belongings accounted for. Patient to follow up with MD in  1 weeks. Patient medications hand delivered from outpatient pharmacy. Reviewed information on medications.   PIV removed. Assisted via WC to private vehicle.

## 2024-02-01 NOTE — Discharge Summary (Signed)
 Physician Discharge Summary  Cindy Byrd ZOX:[id number] DOB: 1941/07/12 DOA: 01/30/2024  PCP: Melida Quitter, PA  Admit date: 01/30/2024 Discharge date: 02/01/2024  Admitted From: home Disposition:  home  Recommendations for Outpatient Follow-up:  Follow up with PCP in 1-2 weeks Follow up on urine cultures as an outpatient  Home Health: none Equipment/Devices: none  Discharge Condition: stable CODE STATUS: DNR Diet Orders (From admission, onward)     Start     Ordered   01/30/24 2058  Diet regular Room service appropriate? Yes; Fluid consistency: Thin  Diet effective now       Question Answer Comment  Room service appropriate? Yes   Fluid consistency: Thin      01/30/24 2057            HPI: Per admitting MD, Cindy Byrd is a 83 y.o. female with medical history significant for PE/DVT previously on Eliquis, metastatic lung cancer currently on surveillance, GERD, depression, HSV, cornea ulcer of the right eye and emphysema who was sent from her PCP office for evaluation of hypoxia and hypotension. Patient reports that over the last 2 weeks, she has had generalized malaise and not feeling well.  She also reports dyspnea on exertion, pain with urination, and chronic low back pain but denies chest pain, fevers, chills, abdominal pain, nausea or vomiting. She was seen by her PCP for follow-up on her mood and was found to be hypoxic to 87% and hypotensive with SBP in the 60s to 80s. Patient reports she is supposed to be on baseline 3 L [place] and has oxygen at home but currently does not have a portable oxygen device to take with her.  Of note, patient's last refill for Eliquis was on August 21 for 30-day supply.  Patient states after running out of the prescription she was unsure the provider who prescribed it so she never had refills after September.   Hospital Course / Discharge diagnoses: Principal Problem:   Acute pulmonary embolism (HCC) Active Problems:   Cystitis    AKI (acute kidney injury) (HCC)   Principal problem Acute PE-in the setting of being off her Eliquis, she denies this medication being discontinued but just simply "ran out".  She has underlying malignancy which definitely puts her at higher risk of having hypercoagulable state.  Was started on heparin, she tolerated it well, no bleeding, and eventually transition to Eliquis.  Underwent a 2D echo which showed normal LVEF, normal RV, grade 1 diastolic dysfunction.  Lower extremity Dopplers were negative for DVT.  She was able to ambulate few 100 feet in the hospital, feeling improved and will be discharged home in stable condition.  She was advised to follow-up with her PCP within the next 2 to 3 weeks  Active problems UTI-with dysuria, received ceftriaxone while here, dysuria resolved with antibiotics.  Will transition to cefadroxil for 3 additional days.  Follow-up with urine cultures as an outpatient, pending at time of discharge AKI-mild, creatinine 1.27.  Improved Chronic hypoxic respiratory failure, emphysema-on 3 L at home. Metastatic lung cancer-outpatient follow-up, continue pain control Depression-continue home medications Prior herpes zoster infection, postherpetic neuralgia-continue home medications  Sepsis ruled out   Discharge Instructions   Allergies as of 02/01/2024       Reactions   Penicillins Other (See Comments)   Unknown; childhood allergy - details unknown Tolerates Ancef   Remeron [mirtazapine] Other (See Comments)   nightmares   Latex Rash        Medication List  STOP taking these medications    apixaban 2.5 MG Tabs tablet Commonly known as: ELIQUIS Replaced by: Apixaban Starter Pack (10mg  and 5mg )       TAKE these medications    acetaminophen 325 MG tablet Commonly known as: TYLENOL Take 2 tablets (650 mg total) by mouth every 6 (six) hours as needed for mild pain (or Fever >/= 101).   albuterol 108 (90 Base) MCG/ACT inhaler Commonly known  as: VENTOLIN HFA Inhale 2 puffs into the lungs every 6 (six) hours as needed for wheezing.   Apixaban Starter Pack (10mg  and 5mg ) Commonly known as: ELIQUIS STARTER PACK Take as directed on package: start with two-5mg  tablets twice daily for 7 days. On day 8, switch to one-5mg  tablet twice daily. Replaces: apixaban 2.5 MG Tabs tablet   Breztri Aerosphere 160-9-4.8 MCG/ACT Aero Generic drug: Budeson-Glycopyrrol-Formoterol INHALE 2 PUFFS INTO THE LUNGS TWICE A DAY   buPROPion 150 MG 24 hr tablet Commonly known as: WELLBUTRIN XL TAKE 1 TABLET BY MOUTH EVERY DAY   cefadroxil 500 MG capsule Commonly known as: DURICEF Take 1 capsule (500 mg total) by mouth 2 (two) times daily for 3 days.   DULoxetine 30 MG capsule Commonly known as: CYMBALTA Take 3 capsules (90 mg total) by mouth daily.   fentaNYL 25 MCG/HR Commonly known as: DURAGESIC Place 1 patch onto the skin every 3 (three) days.   gabapentin 600 MG tablet Commonly known as: NEURONTIN Take 1 tablet (600 mg total) by mouth 3 (three) times daily.   HYDROcodone-acetaminophen 5-325 MG tablet Commonly known as: Norco Take 1 tablet by mouth every 6 (six) hours as needed for moderate pain.   neomycin-polymyxin b-dexamethasone 3.5-10000-0.1 Susp Commonly known as: MAXITROL Place 1 drop into the right eye 4 (four) times daily.   omeprazole 40 MG capsule Commonly known as: PRILOSEC Take 1 capsule (40 mg total) by mouth daily before breakfast.   ondansetron 4 MG disintegrating tablet Commonly known as: ZOFRAN-ODT Take 1 tablet (4 mg total) by mouth every 8 (eight) hours as needed for nausea or vomiting.   traZODone 100 MG tablet Commonly known as: DESYREL Take 100 mg by mouth at bedtime as needed for sleep.   valACYclovir 500 MG tablet Commonly known as: VALTREX TAKE 1 TABLET BY MOUTH TWICE A DAY       Consultations: none  Procedures/Studies:  VAS [place] LOWER EXTREMITY VENOUS (DVT) Result Date: 01/31/2024  Lower  Venous DVT Study Patient Name:  Cindy Byrd  Date of Exam:   01/31/2024 Medical Rec #: [id number]         Accession #:    [id number] Date of Birth: December 15, 1940         Patient Gender: F Patient Age:   63 years Exam Location:  [facility] Procedure:      VAS [place] LOWER EXTREMITY VENOUS (DVT) Referring Phys: PROSPER AMPONSAH --------------------------------------------------------------------------------  Indications: Pain, and pulmonary embolism.  Risk Factors: None identified. Anticoagulation: Heparin. Comparison Study: No prior studies. Performing Technologist: Chanda Busing RVT  Examination Guidelines: A complete evaluation includes B-mode imaging, spectral Doppler, color Doppler, and power Doppler as needed of all accessible portions of each vessel. Bilateral testing is considered an integral part of a complete examination. Limited examinations for reoccurring indications may be performed as noted. The reflux portion of the exam is performed with the patient in reverse Trendelenburg.  +---------+---------------+---------+-----------+----------+--------------+ RIGHT    CompressibilityPhasicitySpontaneityPropertiesThrombus Aging +---------+---------------+---------+-----------+----------+--------------+ CFV      Full  Yes      Yes                                 +---------+---------------+---------+-----------+----------+--------------+ SFJ      Full                                                        +---------+---------------+---------+-----------+----------+--------------+ FV Prox  Full                                                        +---------+---------------+---------+-----------+----------+--------------+ FV Mid   Full                                                        +---------+---------------+---------+-----------+----------+--------------+ FV DistalFull                                                         +---------+---------------+---------+-----------+----------+--------------+ PFV      Full                                                        +---------+---------------+---------+-----------+----------+--------------+ POP      Full           Yes      Yes                                 +---------+---------------+---------+-----------+----------+--------------+ PTV      Full                                                        +---------+---------------+---------+-----------+----------+--------------+ PERO     Full                                                        +---------+---------------+---------+-----------+----------+--------------+   +---------+---------------+---------+-----------+----------+--------------+ LEFT     CompressibilityPhasicitySpontaneityPropertiesThrombus Aging +---------+---------------+---------+-----------+----------+--------------+ CFV      Full           Yes      Yes                                 +---------+---------------+---------+-----------+----------+--------------+ SFJ  Full                                                        +---------+---------------+---------+-----------+----------+--------------+ FV Prox  Full                                                        +---------+---------------+---------+-----------+----------+--------------+ FV Mid   Full                                                        +---------+---------------+---------+-----------+----------+--------------+ FV DistalFull                                                        +---------+---------------+---------+-----------+----------+--------------+ PFV      Full                                                        +---------+---------------+---------+-----------+----------+--------------+ POP      Full           Yes      Yes                                  +---------+---------------+---------+-----------+----------+--------------+ PTV      Full                                                        +---------+---------------+---------+-----------+----------+--------------+ PERO     Full                                                        +---------+---------------+---------+-----------+----------+--------------+     Summary: RIGHT: - There is no evidence of deep vein thrombosis in the lower extremity.  - No cystic structure found in the popliteal fossa.  LEFT: - There is no evidence of deep vein thrombosis in the lower extremity.  - No cystic structure found in the popliteal fossa.  *See table(s) above for measurements and observations. Electronically signed by Coral Else MD on 01/31/2024 at 4:40:42 PM.    Final    ECHOCARDIOGRAM COMPLETE Result Date: 01/31/2024    ECHOCARDIOGRAM REPORT   Patient Name:   CHRISETTE MAN Date of Exam: 01/31/2024 Medical Rec #:  [id number]        Height:  68.0 in Accession #:    [id number]       Weight:       131.8 lb Date of Birth:  1941-05-10        BSA:          1.712 m Patient Age:    83 years         BP:           106/87 mmHg Patient Gender: F                HR:           89 bpm. Exam Location:  Inpatient Procedure: 2D Echo, Cardiac Doppler and Color Doppler (Both Spectral and Color            Flow Doppler were utilized during procedure). Indications:    Pulmonary embolus  History:        Patient has prior history of Echocardiogram examinations, most                 recent 03/15/2019. Arrythmias:Atrial Fibrillation; Risk                 Factors:Hypertension and Dyslipidemia.  Sonographer:    Vern Claude Referring Phys: [id number] PROSPER M AMPONSAH IMPRESSIONS  1. Left ventricular ejection fraction, by estimation, is 60 to 65%. The left ventricle has normal function. The left ventricle has no regional wall motion abnormalities. Left ventricular diastolic parameters are consistent with Grade I diastolic  dysfunction (impaired relaxation).  2. Right ventricular systolic function is normal. The right ventricular size is normal.  3. The mitral valve is normal in structure. Trivial mitral valve regurgitation. No evidence of mitral stenosis.  4. The aortic valve is normal in structure. Aortic valve regurgitation is not visualized. No aortic stenosis is present.  5. The inferior vena cava is normal in size with greater than 50% respiratory variability, suggesting right atrial pressure of 3 mmHg. FINDINGS  Left Ventricle: Left ventricular ejection fraction, by estimation, is 60 to 65%. The left ventricle has normal function. The left ventricle has no regional wall motion abnormalities. Strain imaging was not performed. The left ventricular internal cavity  size was normal in size. There is no left ventricular hypertrophy. Left ventricular diastolic parameters are consistent with Grade I diastolic dysfunction (impaired relaxation). Right Ventricle: The right ventricular size is normal. No increase in right ventricular wall thickness. Right ventricular systolic function is normal. Left Atrium: Left atrial size was normal in size. Right Atrium: Right atrial size was normal in size. Pericardium: There is no evidence of pericardial effusion. Mitral Valve: The mitral valve is normal in structure. Trivial mitral valve regurgitation. No evidence of mitral valve stenosis. MV peak gradient, 3.6 mmHg. The mean mitral valve gradient is 2.0 mmHg. Tricuspid Valve: The tricuspid valve is normal in structure. Tricuspid valve regurgitation is trivial. No evidence of tricuspid stenosis. Aortic Valve: The aortic valve is normal in structure. Aortic valve regurgitation is not visualized. No aortic stenosis is present. Aortic valve mean gradient measures 3.0 mmHg. Aortic valve peak gradient measures 6.4 mmHg. Aortic valve area, by VTI measures 3.41 cm. Pulmonic Valve: The pulmonic valve was normal in structure. Pulmonic valve regurgitation is  trivial. No evidence of pulmonic stenosis. Aorta: The aortic root is normal in size and structure. Venous: The inferior vena cava is normal in size with greater than 50% respiratory variability, suggesting right atrial pressure of 3 mmHg. IAS/Shunts: No atrial level shunt detected by color flow Doppler. Additional Comments:  3D imaging was not performed.  LEFT VENTRICLE PLAX 2D LVIDd:         4.20 cm     Diastology LVIDs:         2.80 cm     LV e' medial:    4.57 cm/s LV PW:         0.80 cm     LV E/e' medial:  14.2 LV IVS:        0.80 cm     LV e' lateral:   6.42 cm/s LVOT diam:     1.90 cm     LV E/e' lateral: 10.1 LV SV:         60 LV SV Index:   35 LVOT Area:     2.84 cm  LV Volumes (MOD) LV vol d, MOD A2C: 79.8 ml LV vol d, MOD A4C: 73.3 ml LV vol s, MOD A2C: 25.4 ml LV vol s, MOD A4C: 26.1 ml LV SV MOD A2C:     54.4 ml LV SV MOD A4C:     73.3 ml LV SV MOD BP:      52.9 ml RIGHT VENTRICLE             IVC RV Basal diam:  3.10 cm     IVC diam: 1.90 cm RV Mid diam:    2.30 cm RV S prime:     11.40 cm/s TAPSE (M-mode): 1.7 cm LEFT ATRIUM             Index        RIGHT ATRIUM          Index LA diam:        3.60 cm 2.10 cm/m   RA Area:     9.36 cm LA Vol (A2C):   23.4 ml 13.67 ml/m  RA Volume:   18.20 ml 10.63 ml/m LA Vol (A4C):   22.2 ml 12.97 ml/m LA Biplane Vol: 24.8 ml 14.49 ml/m  AORTIC VALVE                    PULMONIC VALVE AV Area (Vmax):    2.48 cm     PV Vmax:       0.98 m/s AV Area (Vmean):   2.71 cm     PV Peak grad:  3.9 mmHg AV Area (VTI):     3.41 cm AV Vmax:           126.00 cm/s AV Vmean:          75.700 cm/s AV VTI:            0.177 m AV Peak Grad:      6.4 mmHg AV Mean Grad:      3.0 mmHg LVOT Vmax:         110.00 cm/s LVOT Vmean:        72.300 cm/s LVOT VTI:          0.213 m LVOT/AV VTI ratio: 1.20  AORTA Ao Root diam: 3.60 cm Ao Asc diam:  3.30 cm MITRAL VALVE MV Area (PHT): 1.94 cm    SHUNTS MV Area VTI:   2.83 cm    Systemic VTI:  0.21 m MV Peak grad:  3.6 mmHg    Systemic Diam: 1.90  cm MV Mean grad:  2.0 mmHg MV Vmax:       0.95 m/s MV Vmean:      67.7 cm/s MV Decel Time: 391 msec MV E velocity: 65.10 cm/s MV  A velocity: 93.40 cm/s MV E/A ratio:  0.70 Arvilla Meres MD Electronically signed by Arvilla Meres MD Signature Date/Time: 01/31/2024/10:31:29 AM    Final    CT Angio Chest PE W and/or Wo Contrast Addendum Date: 01/30/2024 ADDENDUM REPORT: 01/30/2024 21:50 ADDENDUM: In the impression, an additional item should have been included under the chest section: 5. New 9 mm mean diameter left upper lobe pulmonary nodule, concerning for metastatic disease. Electronically Signed   By: Sharlet Salina M.D.   On: 01/30/2024 21:50   Result Date: 01/30/2024 CLINICAL DATA:  Non-small cell lung cancer, esophageal cancer, history of skeletal metastases, hypoxia, hypotension, malaise, decreased urination EXAM: CT ANGIOGRAPHY CHEST CT ABDOMEN AND PELVIS WITH CONTRAST TECHNIQUE: Multidetector CT imaging of the chest was performed using the standard protocol during bolus administration of intravenous contrast. Multiplanar CT image reconstructions and MIPs were obtained to evaluate the vascular anatomy. Multidetector CT imaging of the abdomen and pelvis was performed using the standard protocol during bolus administration of intravenous contrast. RADIATION DOSE REDUCTION: This exam was performed according to the departmental dose-optimization program which includes automated exposure control, adjustment of the mA and/or kV according to patient size and/or use of iterative reconstruction technique. CONTRAST:  80mL OMNIPAQUE IOHEXOL 350 MG/ML SOLN COMPARISON:  01/23/2024, 09/19/2023, 05/04/2023 FINDINGS: CTA CHEST FINDINGS Cardiovascular: This is a technically adequate evaluation of the pulmonary vasculature. Stable sequela of chronic thromboembolic disease within synechia in the right upper lobe, and nonocclusive the herein clot within the proximal left upper and left lower lobe pulmonary arteries,  without change since prior study. There are acute segmental right lower lobe pulmonary emboli identified on this exam, with minimal clot burden. Stable enlarged main pulmonary artery consistent with chronic pulmonary arterial hypertension. The heart is unremarkable without pericardial effusion. Normal caliber of the thoracic aorta. Stable atherosclerosis of the aorta, with significant plaque involving the distal aortic arch without critical stenosis. No evidence of dissection. Right chest wall port via internal jugular approach, tip within the superior vena cava. Mediastinum/Nodes: No enlarged mediastinal, hilar, or axillary lymph nodes. Thyroid gland, trachea, and esophagus demonstrate no significant findings. Lungs/Pleura: Stable emphysema. No acute airspace disease, effusion, or pneumothorax. Mild bilateral bronchial wall thickening, greatest in the lower lobes. Fluid-filled segmental left lower lobe bronchi are identified, which may be due to retained secretions or aspiration. Since the 09/19/2023 exam, a 10 x 8 mm left upper lobe pulmonary nodule has developed, reference image 69/13. Musculoskeletal: Multiple prior healed left rib fractures are noted. There are no acute or destructive bony abnormalities. Reconstructed images demonstrate no additional findings. Review of the MIP images confirms the above findings. CT ABDOMEN and PELVIS FINDINGS Hepatobiliary: Multiple small hypodensities are again seen throughout the liver, unchanged, and most consistent with cysts or hemangiomas. Multiple small gallstones without cholecystitis. No biliary duct dilation. Pancreas: Unremarkable. No pancreatic ductal dilatation or surrounding inflammatory changes. Spleen: Stable splenic atrophy with areas of capsular contraction and scarring. Adrenals/Urinary Tract: Adrenal glands are unremarkable. Kidneys are normal, without renal calculi, focal lesion, or hydronephrosis. The bladder is only minimally distended, limiting its  evaluation. There is mild perivesicular fat stranding, and underlying cystitis cannot be excluded. Stomach/Bowel: No bowel obstruction or ileus. Normal appendix right lower quadrant. Diverticulosis of the sigmoid colon without evidence of acute diverticulitis. No bowel wall thickening or inflammatory change. Vascular/Lymphatic: Aortic atherosclerosis. No enlarged abdominal or pelvic lymph nodes. Reproductive: Status post hysterectomy. No adnexal masses. Other: No free fluid or free intraperitoneal gas. No abdominal wall hernia. Musculoskeletal:  Stable mixed lytic and sclerotic osseous metastasis within the left iliac bone. No acute displaced fracture. Reconstructed images demonstrate no additional findings. Review of the MIP images confirms the above findings. IMPRESSION: Chest: 1. Acute right lower lobe segmental pulmonary emboli. Minimal clot burden with no evidence of right heart strain. 2. Stable sequela of chronic pulmonary thromboembolic disease, with thin synechia in the right upper lobe and chronic adherent nonocclusive thrombus within the left upper and left lower lobe pulmonary arteries origins. 3. Mild bilateral bronchial wall thickening, greatest in the lower lobes. Fluid-filled segmental left lower lobe bronchi. Findings likely reflect bronchitis and retained secretions. 4. Aortic Atherosclerosis (ICD10-I70.0) and Emphysema (ICD10-J43.9). Abdomen/pelvis: 1. 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 2. No other signs of intra-abdominal or intrapelvic metastases. 3. Sigmoid diverticulosis without diverticulitis. 4. Cholelithiasis without cholecystitis. 5.  Aortic Atherosclerosis (ICD10-I70.0). Critical Value/emergent results were called by telephone at the time of interpretation on 01/30/2024 at 7:22 pm to provider [facility] , who verbally acknowledged these results. Electronically Signed: By: Sharlet Salina M.D. On: 01/30/2024 19:34   CT ABDOMEN PELVIS W  CONTRAST Addendum Date: 01/30/2024 ADDENDUM REPORT: 01/30/2024 21:50 ADDENDUM: In the impression, an additional item should have been included under the chest section: 5. New 9 mm mean diameter left upper lobe pulmonary nodule, concerning for metastatic disease. Electronically Signed   By: Sharlet Salina M.D.   On: 01/30/2024 21:50   Result Date: 01/30/2024 CLINICAL DATA:  Non-small cell lung cancer, esophageal cancer, history of skeletal metastases, hypoxia, hypotension, malaise, decreased urination EXAM: CT ANGIOGRAPHY CHEST CT ABDOMEN AND PELVIS WITH CONTRAST TECHNIQUE: Multidetector CT imaging of the chest was performed using the standard protocol during bolus administration of intravenous contrast. Multiplanar CT image reconstructions and MIPs were obtained to evaluate the vascular anatomy. Multidetector CT imaging of the abdomen and pelvis was performed using the standard protocol during bolus administration of intravenous contrast. RADIATION DOSE REDUCTION: This exam was performed according to the departmental dose-optimization program which includes automated exposure control, adjustment of the mA and/or kV according to patient size and/or use of iterative reconstruction technique. CONTRAST:  80mL OMNIPAQUE IOHEXOL 350 MG/ML SOLN COMPARISON:  01/23/2024, 09/19/2023, 05/04/2023 FINDINGS: CTA CHEST FINDINGS Cardiovascular: This is a technically adequate evaluation of the pulmonary vasculature. Stable sequela of chronic thromboembolic disease within synechia in the right upper lobe, and nonocclusive the herein clot within the proximal left upper and left lower lobe pulmonary arteries, without change since prior study. There are acute segmental right lower lobe pulmonary emboli identified on this exam, with minimal clot burden. Stable enlarged main pulmonary artery consistent with chronic pulmonary arterial hypertension. The heart is unremarkable without pericardial effusion. Normal caliber of the thoracic  aorta. Stable atherosclerosis of the aorta, with significant plaque involving the distal aortic arch without critical stenosis. No evidence of dissection. Right chest wall port via internal jugular approach, tip within the superior vena cava. Mediastinum/Nodes: No enlarged mediastinal, hilar, or axillary lymph nodes. Thyroid gland, trachea, and esophagus demonstrate no significant findings. Lungs/Pleura: Stable emphysema. No acute airspace disease, effusion, or pneumothorax. Mild bilateral bronchial wall thickening, greatest in the lower lobes. Fluid-filled segmental left lower lobe bronchi are identified, which may be due to retained secretions or aspiration. Since the 09/19/2023 exam, a 10 x 8 mm left upper lobe pulmonary nodule has developed, reference image 69/13. Musculoskeletal: Multiple prior healed left rib fractures are noted. There are no acute or destructive bony abnormalities. Reconstructed images demonstrate no additional  findings. Review of the MIP images confirms the above findings. CT ABDOMEN and PELVIS FINDINGS Hepatobiliary: Multiple small hypodensities are again seen throughout the liver, unchanged, and most consistent with cysts or hemangiomas. Multiple small gallstones without cholecystitis. No biliary duct dilation. Pancreas: Unremarkable. No pancreatic ductal dilatation or surrounding inflammatory changes. Spleen: Stable splenic atrophy with areas of capsular contraction and scarring. Adrenals/Urinary Tract: Adrenal glands are unremarkable. Kidneys are normal, without renal calculi, focal lesion, or hydronephrosis. The bladder is only minimally distended, limiting its evaluation. There is mild perivesicular fat stranding, and underlying cystitis cannot be excluded. Stomach/Bowel: No bowel obstruction or ileus. Normal appendix right lower quadrant. Diverticulosis of the sigmoid colon without evidence of acute diverticulitis. No bowel wall thickening or inflammatory change. Vascular/Lymphatic:  Aortic atherosclerosis. No enlarged abdominal or pelvic lymph nodes. Reproductive: Status post hysterectomy. No adnexal masses. Other: No free fluid or free intraperitoneal gas. No abdominal wall hernia. 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 No acute displaced fracture. Reconstructed images demonstrate no additional findings. Review of the MIP images confirms the above findings. IMPRESSION: Chest: 1. Acute right lower lobe segmental pulmonary emboli. Minimal clot burden with no evidence of right heart strain. 2. Stable sequela of chronic pulmonary thromboembolic disease, with thin synechia in the right upper lobe and chronic adherent nonocclusive thrombus within the left upper and left lower lobe pulmonary arteries origins. 3. Mild bilateral bronchial wall thickening, greatest in the lower lobes. Fluid-filled segmental left lower lobe bronchi. Findings likely reflect bronchitis and retained secretions. 4. Aortic Atherosclerosis (ICD10-I70.0) and Emphysema (ICD10-J43.9). Abdomen/pelvis: 1. 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 2. No other signs of intra-abdominal or intrapelvic metastases. 3. Sigmoid diverticulosis without diverticulitis. 4. Cholelithiasis without cholecystitis. 5.  Aortic Atherosclerosis (ICD10-I70.0). Critical Value/emergent results were called by telephone at the time of interpretation on 01/30/2024 at 7:22 pm to provider [facility] , who verbally acknowledged these results. Electronically Signed: By: Sharlet Salina M.D. On: 01/30/2024 19:34   CT T-SPINE NO CHARGE Result Date: 01/30/2024 CLINICAL DATA:  Metastatic lung cancer, hypoxia, back pain for 2 weeks EXAM: CT Thoracic and Lumbar spine with contrast TECHNIQUE: Multiplanar CT images of the thoracic and lumbar spine were reconstructed from contemporary CT of the Chest, Abdomen, and Pelvis. RADIATION DOSE REDUCTION: This exam was performed  according to the departmental dose-optimization program which includes automated exposure control, adjustment of the mA and/or kV according to patient size and/or use of iterative reconstruction technique. CONTRAST:  No additional COMPARISON:  09/21/2023, 09/19/2023, 06/15/2023 FINDINGS: CT THORACIC SPINE FINDINGS Alignment: Alignment is anatomic. Vertebrae: There are no acute displaced fractures. No destructive bony lesions. Paraspinal and other soft tissues: The paraspinal soft tissues are unremarkable. Please refer to separately reported chest CT exam describing acute and chronic pulmonary emboli. Disc levels: There is mild diffuse thoracic spondylosis. No bony encroachment upon the central canal or neural foramina. CT LUMBAR SPINE FINDINGS Segmentation: 5 lumbar type vertebrae. Alignment: Right convex scoliosis centered at L3. Otherwise alignment is anatomic. Vertebrae: There are no acute fractures or destructive lesions within the lumbar spine. The mixed lytic and sclerotic metastatic lesion within the left iliac bone with sequela of chronic pathologic fracture is unchanged. Paraspinal and other soft tissues: The paraspinal soft tissues are unremarkable. Please refer to the separately reported abdomen and pelvic CT for findings in that region. Disc levels: There is circumferential disc bulge bilateral facet hypertrophy at L2-3, L3-4, and L4-5, with mild  central canal stenosis. The neural foramina are grossly patent at L2-3 and L3-4, with left predominant neural foraminal narrowing at L4-5. Reconstructed images demonstrate no additional findings. IMPRESSION: 1. No acute or destructive lesions within the thoracolumbar spine. 2. Mild diffuse thoracic spondylosis without compressive sequela. 3. Multilevel lumbar spondylosis and facet hypertrophy, most pronounced from L2-3 through L4-5. 4. Mixed sclerotic and lytic lesion within the left iliac bone again noted, compatible with known bony metastases. 5. Please refer  to separately reported CT chest, abdomen, and pelvis exams for important findings in those regions. Electronically Signed   By: Sharlet Salina M.D.   On: 01/30/2024 19:27   CT L-SPINE NO CHARGE Result Date: 01/30/2024 CLINICAL DATA:  Metastatic lung cancer, hypoxia, back pain for 2 weeks EXAM: CT Thoracic and Lumbar spine with contrast TECHNIQUE: Multiplanar CT images of the thoracic and lumbar spine were reconstructed from contemporary CT of the Chest, Abdomen, and Pelvis. RADIATION DOSE REDUCTION: This exam was performed according to the departmental dose-optimization program which includes automated exposure control, adjustment of the mA and/or kV according to patient size and/or use of iterative reconstruction technique. CONTRAST:  No additional COMPARISON:  09/21/2023, 09/19/2023, 06/15/2023 FINDINGS: CT THORACIC SPINE FINDINGS Alignment: Alignment is anatomic. Vertebrae: There are no acute displaced fractures. No destructive bony lesions. Paraspinal and other soft tissues: The paraspinal soft tissues are unremarkable. Please refer to separately reported chest CT exam describing acute and chronic pulmonary emboli. Disc levels: There is mild diffuse thoracic spondylosis. No bony encroachment upon the central canal or neural foramina. CT LUMBAR SPINE FINDINGS Segmentation: 5 lumbar type vertebrae. Alignment: Right convex scoliosis centered at L3. Otherwise alignment is anatomic. Vertebrae: There are no acute fractures or destructive lesions within the lumbar spine. The mixed lytic and sclerotic metastatic lesion within the left iliac bone with sequela of chronic pathologic fracture is unchanged. Paraspinal and other soft tissues: The paraspinal soft tissues are unremarkable. Please refer to the separately reported abdomen and pelvic CT for findings in that region. Disc levels: There is circumferential disc bulge bilateral facet hypertrophy at L2-3, L3-4, and L4-5, with mild central canal stenosis. The neural  foramina are grossly patent at L2-3 and L3-4, with left predominant neural foraminal narrowing at L4-5. Reconstructed images demonstrate no additional findings. IMPRESSION: 1. No acute or destructive lesions within the thoracolumbar spine. 2. Mild diffuse thoracic spondylosis without compressive sequela. 3. Multilevel lumbar spondylosis and facet hypertrophy, most pronounced from L2-3 through L4-5. 4. Mixed sclerotic and lytic lesion within the left iliac bone again noted, compatible with known bony metastases. 5. Please refer to separately reported CT chest, abdomen, and pelvis exams for important findings in those regions. Electronically Signed   By: Sharlet Salina M.D.   On: 01/30/2024 19:27   DG Chest Portable 1 View Result Date: 01/30/2024 CLINICAL DATA:  Cough, congestion, weakness. EXAM: PORTABLE CHEST 1 VIEW COMPARISON:  09/10/2022. FINDINGS: There are stable linear opacities scattered throughout bilateral lungs, which may represent scarring/atelectasis. Bilateral lung fields are otherwise clear. Bilateral costophrenic angles are clear. Normal cardio-mediastinal silhouette. No acute osseous abnormalities. The soft tissues are within normal limits. Right-sided CT Port-A-Cath is seen with its tip overlying the midportion of superior vena cava, unchanged. IMPRESSION: No active disease. Electronically Signed   By: Jules Schick M.D.   On: 01/30/2024 15:07     Subjective: - no chest pain, shortness of breath, no abdominal pain, nausea or vomiting.   Discharge Exam: BP 135/68 (BP Location: Left Arm)   Pulse  77   Temp 98.5 F (36.9 C) (Oral)   Resp 18   Ht 5\' 8"  (1.727 m)   Wt 59.8 kg   SpO2 96%   BMI 20.04 kg/m   General: Pt is alert, awake, not in acute distress Cardiovascular: RRR, S1/S2 +, no rubs, no gallops Respiratory: CTA bilaterally, no wheezing, no rhonchi Abdominal: Soft, NT, ND, bowel sounds + Extremities: no edema, no cyanosis    The results of significant diagnostics  from this hospitalization (including imaging, microbiology, ancillary and laboratory) are listed below for reference.     Microbiology: No results found for this or any previous visit (from the past 240 hours).   Labs: Basic Metabolic Panel: Recent Labs  Lab 01/30/24 1418 01/31/24 0433  NA 137 137  K 4.7 4.6  CL 102 103  CO2 27 26  GLUCOSE 122* 96  BUN 22 20  CREATININE 1.27* 1.10*  CALCIUM 8.8* 7.9*   Liver Function Tests: Recent Labs  Lab 01/30/24 1418  AST 25  ALT 12  ALKPHOS 61  BILITOT 0.4  PROT 7.6  ALBUMIN 3.0*   CBC: Recent Labs  Lab 01/30/24 1418 01/31/24 0433  WBC 5.7 12.0*  NEUTROABS 2.8  --   HGB 13.1 13.1  HCT 41.6 40.8  MCV 97.7 97.1  PLT 280 257   CBG: No results for input(s): "GLUCAP" in the last 168 hours. Hgb A1c No results for input(s): "HGBA1C" in the last 72 hours. Lipid Profile No results for input(s): "CHOL", "HDL", "LDLCALC", "TRIG", "CHOLHDL", "LDLDIRECT" in the last 72 hours. Thyroid function studies Recent Labs    01/30/24 1710  TSH 2.860   Urinalysis    Component Value Date/Time   COLORURINE AMBER (A) 01/30/2024 1315   APPEARANCEUR HAZY (A) 01/30/2024 1315   LABSPEC 1.027 01/30/2024 1315   PHURINE 5.0 01/30/2024 1315   GLUCOSEU NEGATIVE 01/30/2024 1315   HGBUR NEGATIVE 01/30/2024 1315   BILIRUBINUR SMALL (A) 01/30/2024 1315   BILIRUBINUR negative 06/13/2023 1431   BILIRUBINUR Negative 05/02/2023 1443   KETONESUR NEGATIVE 01/30/2024 1315   PROTEINUR 30 (A) 01/30/2024 1315   UROBILINOGEN 0.2 06/13/2023 1431   NITRITE NEGATIVE 01/30/2024 1315   LEUKOCYTESUR TRACE (A) 01/30/2024 1315    FURTHER DISCHARGE INSTRUCTIONS:   Get Medicines reviewed and adjusted: Please take all your medications with you for your next visit with your Primary MD   Laboratory/radiological data: Please request your Primary MD to go over all hospital tests and procedure/radiological results at the follow up, please ask your Primary MD to  get all Hospital records sent to his/her office.   In some cases, they will be blood work, cultures and biopsy results pending at the time of your discharge. Please request that your primary care M.D. goes through all the records of your hospital data and follows up on these results.   Also Note the following: If you experience worsening of your admission symptoms, develop shortness of breath, life threatening emergency, suicidal or homicidal thoughts you must seek medical attention immediately by calling 911 or calling your MD immediately  if symptoms less severe.   You must read complete instructions/literature along with all the possible adverse reactions/side effects for all the Medicines you take and that have been prescribed to you. Take any new Medicines after you have completely understood and accpet all the possible adverse reactions/side effects.    Do not drive when taking Pain medications or sleeping medications (Benzodaizepines)   Do not take more than prescribed Pain, Sleep  and Anxiety Medications. It is not advisable to combine anxiety,sleep and pain medications without talking with your primary care practitioner   Special Instructions: If you have smoked or chewed Tobacco  in the last 2 yrs please stop smoking, stop any regular Alcohol  and or any Recreational drug use.   Wear Seat belts while driving.   Please note: You were cared for by a hospitalist during your hospital stay. Once you are discharged, your primary care physician will handle any further medical issues. Please note that NO REFILLS for any discharge medications will be authorized once you are discharged, as it is imperative that you return to your primary care physician (or establish a relationship with a primary care physician if you do not have one) for your post hospital discharge needs so that they can reassess your need for medications and monitor your lab values.  Time coordinating discharge: 35  minutes  SIGNED:  Pamella Pert, MD, PhD 02/01/2024, 8:00 AM

## 2024-02-01 NOTE — TOC Transition Note (Addendum)
 Transition of Care [facility]) - Discharge Note   Patient Details  Name: Cindy Byrd MRN: [id number] Date of Birth: [birth year]  [facility] CM/SW Contact:  Lanier Clam, RN Phone Number: 02/01/2024, 9:03 AM   Clinical Narrative: Ordered for HHPT/respiratory care patient no preference for [facility]  [place] rep Cindi aware of HHPT-. [facility] can only provide HHPT,they do not have respiratory care- the HHPT will provide the respiratory care services like-checking 02 sats,energy conservation exercises, breathing exercises. I have explained to patient, & son -they r in agreement. MD updated.No further CM needs.      Final next level of care: Home w Home Health Services Barriers to Discharge: No Barriers Identified   Patient Goals and CMS Choice Patient states their goals for this hospitalization and ongoing recovery are:: Home CMS Medicare.gov Compare Post Acute Care list provided to:: Patient Choice offered to / list presented to : Patient [place] ownership interest in [facility].provided to:: Patient    Discharge Placement                       Discharge Plan and Services Additional resources added to the After Visit Summary for     Discharge Planning Services: CM Consult                      HH Arranged: RN, Respirator Therapy HH Agency: [facility] Date [facility] Agency Contacted: 01/31/24 Time HH Agency Contacted: [phone] Representative spoke with at [facility] Agency: Cindi  Social Drivers of Health (SDOH) Interventions SDOH Screenings   Food Insecurity: No Food Insecurity (01/31/2024)  Housing: Low Risk  (01/31/2024)  Transportation Needs: No Transportation Needs (01/31/2024)  Utilities: Not At Risk (01/31/2024)  Alcohol Screen: Low Risk  (03/28/2023)  Depression (PHQ2-9): Medium Risk (01/30/2024)  Financial Resource Strain: Low Risk  (03/28/2023)  Physical Activity: Inactive (03/28/2023)  Social Connections: Socially Isolated (01/31/2024)   Stress: No Stress Concern Present (03/28/2023)  Tobacco Use: Medium Risk (01/30/2024)     Readmission Risk Interventions     No data to display

## 2024-02-01 NOTE — TOC Transition Note (Signed)
 Transition of Care [facility]) - Discharge Note   Patient Details  Name: Cindy Byrd MRN: [id number] Date of Birth: Dec 31, 1940  Transition of Care [facility]) CM/SW Contact:  Lanier Clam, RN Phone Number: 02/01/2024, 8:59 AM   Clinical Narrative:  d/c home no further CM needs. Has home 02 travel tank.     Final next level of care: Home/Self Care Barriers to Discharge: No Barriers Identified   Patient Goals and CMS Choice Patient states their goals for this hospitalization and ongoing recovery are:: Home CMS Medicare.gov Compare Post Acute Care list provided to:: Patient Choice offered to / list presented to : Patient [place] ownership interest in [facility].provided to:: Patient    Discharge Placement                       Discharge Plan and Services Additional resources added to the After Visit Summary for     Discharge Planning Services: CM Consult                                 Social Drivers of Health (SDOH) Interventions SDOH Screenings   Food Insecurity: No Food Insecurity (01/31/2024)  Housing: Low Risk  (01/31/2024)  Transportation Needs: No Transportation Needs (01/31/2024)  Utilities: Not At Risk (01/31/2024)  Alcohol Screen: Low Risk  (03/28/2023)  Depression (PHQ2-9): Medium Risk (01/30/2024)  Financial Resource Strain: Low Risk  (03/28/2023)  Physical Activity: Inactive (03/28/2023)  Social Connections: Socially Isolated (01/31/2024)  Stress: No Stress Concern Present (03/28/2023)  Tobacco Use: Medium Risk (01/30/2024)     Readmission Risk Interventions     No data to display

## 2024-02-01 NOTE — Progress Notes (Signed)
 Discharge medications delivered from outpatient [facility] pharmacy by this RN

## 2024-02-02 ENCOUNTER — Telehealth: Payer: Self-pay | Admitting: Family Medicine

## 2024-02-02 ENCOUNTER — Telehealth: Payer: Self-pay | Admitting: *Deleted

## 2024-02-02 LAB — URINE CULTURE: Culture: 40000 — AB

## 2024-02-02 NOTE — Transitions of Care (Post Inpatient/ED Visit) (Signed)
 02/02/2024  Name: Cindy Byrd MRN: [id number] DOB: 1941/07/15  Today's TOC FU Call Status: Today's TOC FU Call Status:: Successful TOC FU Call Completed TOC FU Call Complete Date: 02/02/24 Patient's Name and Date of Birth confirmed.  Transition Care Management Follow-up Telephone Call Date of Discharge: 02/01/24 Discharge Facility: [facility]) Type of Discharge: Inpatient Admission Primary Inpatient Discharge Diagnosis:: pulmonary embolism; hypoxia; in setting of lung CA with mets; on home O2 at baseline- 3 L How have you been since you were released from the hospital?: Better ("I am doing okay, feeling better; have not heard from [place] yet; I do not want to go over my medicines- I will go over them with my doctor when I see her.  I am independent and my roommate friend takes me to my appointments") Any questions or concerns?: Yes Patient Questions/Concerns:: Has not yet heard from [facility]- care coordination outreach placed to [place] branch as noted on hospital discharge instructions: confirmed that patient will be followed by [facility] branch [phone]): verified patient has scheduled initial home visit on Monday 02/05/24: updated patient by phone and provided her with the [facility] branch phone number Patient Questions/Concerns Addressed: Other: (care coordination outreach to [place] completed as noted above)  Items Reviewed: Did you receive and understand the discharge instructions provided?: Yes (thoroughly reviewed with patient who verbalizes good understanding of same) Medications obtained,verified, and reconciled?: Partial Review Completed (patient declined full medication review as noted below) Reason for Partial Mediation Review: Patient declined full medication review- states she prefers to review with her PCP: partial medication review completed; confirmed patient obtained/ is taking all newly Rx'd medications as instructed; self-manages medications and  denies questions/ concerns around medications today-- she does NOT want to do TOC medication review: adamantly declines Any new allergies since your discharge?: No Dietary orders reviewed?: Yes Type of Diet Ordered:: "Regular" Do you have support at home?: Yes People in Home: friend(s) Name of Support/Comfort Primary Source: Reports independent in self-care activities; lives with friend who assists as/ if needed/ indicated; local adult children also assist as indicated  Medications Reviewed Today: Medications Reviewed Today     Reviewed by Michaela Corner, RN (Registered Nurse) on 02/02/24 at 1241  Med List Status: <None>   Medication Order Taking? Sig Documenting Provider Last Dose Status Informant  acetaminophen (TYLENOL) 325 MG tablet [id number]  Take 2 tablets (650 mg total) by mouth every 6 (six) hours as needed for mild pain (or Fever >/= 101). Sheikh, Omair [place], DO  Active Self, Pharmacy Records  albuterol (VENTOLIN HFA) 108 (90 Base) MCG/ACT inhaler [id number]  Inhale 2 puffs into the lungs every 6 (six) hours as needed for wheezing.  Patient not taking: Reported on 01/30/2024   Melida Quitter, PA  Active Self, Pharmacy Records  APIXABAN Everlene Balls) VTE STARTER PACK (10MG  AND 5MG ) [id number]  Take as directed on package: start with two-5mg  tablets twice daily for 7 days. On day 8, switch to one-5mg  tablet twice daily. Leatha Gilding, MD  Active   Budeson-Glycopyrrol-Formoterol (BREZTRI AEROSPHERE) 160-9-4.8 MCG/ACT AERO [id number]  INHALE 2 PUFFS INTO THE LUNGS TWICE A DAY Melida Quitter, PA  Active Self, Pharmacy Records  buPROPion (WELLBUTRIN XL) 150 MG 24 hr tablet [id number]  TAKE 1 TABLET BY MOUTH EVERY DAY Abonza, Maritza, PA-C  Active Self, Pharmacy Records  cefadroxil (DURICEF) 500 MG capsule [id number]  Take 1 capsule (500 mg total) by mouth 2 (two) times daily for 3 days. Gherghe, Costin  M, MD  Active   DULoxetine (CYMBALTA) 30 MG capsule [id number]  Take 3 capsules (90 mg  total) by mouth daily. Melida Quitter, PA  Active Self, Pharmacy Records  fentaNYL (DURAGESIC) 25 MCG/HR [id number]  Place 1 patch onto the skin every 3 (three) days. Johney Maine, MD  Active Self, Pharmacy Records  gabapentin (NEURONTIN) 600 MG tablet [id number]  Take 1 tablet (600 mg total) by mouth 3 (three) times daily. Melida Quitter, PA  Active Self, Pharmacy Records  HYDROcodone-acetaminophen [facility]) 5-325 MG tablet [id number]  Take 1 tablet by mouth every 6 (six) hours as needed for moderate pain. Johney Maine, MD  Active Self, Pharmacy Records  neomycin-polymyxin b-dexamethasone (MAXITROL) 3.5-10000-0.1 Santina Evans [id number]  Place 1 drop into the right eye 4 (four) times daily. [provider]  Active Self, Pharmacy Records  omeprazole (PRILOSEC) 40 MG capsule [id number]  Take 1 capsule (40 mg total) by mouth daily before breakfast. Johney Maine, MD  Active Self, Pharmacy Records  ondansetron (ZOFRAN-ODT) 4 MG disintegrating tablet [id number]  Take 1 tablet (4 mg total) by mouth every 8 (eight) hours as needed for nausea or vomiting. Melida Quitter, PA  Active Self, Pharmacy Records  traZODone (DESYREL) 100 MG tablet [id number]  Take 100 mg by mouth at bedtime as needed for sleep. [provider]  Active Self, Pharmacy Records  valACYclovir (VALTREX) 500 MG tablet [id number]  TAKE 1 TABLET BY MOUTH TWICE A DAY Johney Maine, MD  Active Self, Pharmacy Records           Home Care and Equipment/Supplies: Were Home Health Services Ordered?: Yes Name of Home Health Agency:: [facility] [phone]--- [place] branch: they informed  patient is established with [facility] branch:  [phone]- number provided to patient Has Agency set up a time to come to your home?: Yes First Home Health Visit Date: 02/05/24 EMR reviewed for Home Health Orders: Orders present/patient has not received call (refer to CM for follow-up) (see care coordination outreach as  above) Any new equipment or medical supplies ordered?: No  Functional Questionnaire: Do you need assistance with bathing/showering or dressing?: No Do you need assistance with meal preparation?: No Do you need assistance with eating?: No Do you have difficulty maintaining continence: No Do you need assistance with getting out of bed/getting out of a chair/moving?: No Do you have difficulty managing or taking your medications?: No  Follow up appointments reviewed: PCP Follow-up appointment confirmed?: No (care coordination outreach in real-time with scheduling care guide to schedule hospital follow up PCP appointment: advised to have patient call PCP office- informed no appointments available "with any of the providers;" patient updated accordingly) MD Provider Line Number:[phone] Given: No (verified well-established with current PCP) Specialist Hospital Follow-up appointment confirmed?: Yes Date of Specialist follow-up appointment?: 02/26/24 Follow-Up Specialty Provider:: oncology provider Do you need transportation to your follow-up appointment?: No Do you understand care options if your condition(s) worsen?: Yes-patient verbalized understanding  SDOH Interventions Today    Flowsheet Row Most Recent Value  SDOH Interventions   Food Insecurity Interventions Intervention Not Indicated  Housing Interventions Intervention Not Indicated  Transportation Interventions Intervention Not Indicated  [reports her roommate/ friend provides transportation,  provided education around potential health insureance benefit for transportation]  Utilities Interventions Intervention Not Indicated      Interventions Today    Flowsheet Row Most Recent Value  Chronic Disease   Chronic disease during today's visit Other  [Acute pulmonary embolism]  General Interventions  General Interventions Discussed/Reviewed General Interventions Discussed, Durable Medical Equipment (DME), Doctor Visits  Doctor  Visits Discussed/Reviewed Doctor Visits Discussed, PCP, Specialist  Durable Medical Equipment (DME) Oxygen  [confirmed not currently requiring/ using assistive devices for ambulation,  confirmed has been using home O2 "for years, " she denies questions around home O2 use]  PCP/Specialist Visits Compliance with follow-up visit  Exercise Interventions   Exercise Discussed/Reviewed Exercise Discussed  [role of home health services with importance of participation/ ongoing engagement]  Education Interventions   Education Provided Provided Education  Provided Verbal Education On Insurance Plans, Other  [role of home health services with importance of participation/ ongoing engagement]  Nutrition Interventions   Nutrition Discussed/Reviewed Nutrition Discussed  Pharmacy Interventions   Pharmacy Dicussed/Reviewed Pharmacy Topics Discussed      TOC Interventions Today    Flowsheet Row Most Recent Value  TOC Interventions   TOC Interventions Discussed/Reviewed TOC Interventions Discussed  [Patient declines need for ongoing/ further care management outreach,  declines enrollment in 30-day TOC program,  provided my direct contact information should questions/ concerns/ needs arise post-TOC call]      Total time spent from review to signing of note/ including any care coordination interventions: 67 minutes  Pls call/ message for questions,  Caryl Pina, RN, BSN, Media planner  Transitions of Care  [facility] [phone]: direct office

## 2024-02-02 NOTE — Telephone Encounter (Signed)
 Copied from CRM [id number]. Topic: Appointments - Scheduling Inquiry for Clinic >> Feb 02, 2024  1:54 PM Maree Krabbe H wrote: Reason for CRM: Patient just got out of the hospital on 02/01/2024, patient went because of her lungs and her blood pressure dropped low, she wants to come in for a follow up, at this time the patient is not having any pain, and her blood pressure is okay that she knows of because she's feeling better, patient does not want to speak with a nurse right now she just has some questions and needs an appointment. Patient can be called back at [phone].

## 2024-02-05 DIAGNOSIS — N3090 Cystitis, unspecified without hematuria: Secondary | ICD-10-CM | POA: Diagnosis not present

## 2024-02-05 DIAGNOSIS — K5730 Diverticulosis of large intestine without perforation or abscess without bleeding: Secondary | ICD-10-CM | POA: Diagnosis not present

## 2024-02-05 DIAGNOSIS — I2782 Chronic pulmonary embolism: Secondary | ICD-10-CM | POA: Diagnosis not present

## 2024-02-05 DIAGNOSIS — I088 Other rheumatic multiple valve diseases: Secondary | ICD-10-CM | POA: Diagnosis not present

## 2024-02-05 DIAGNOSIS — N179 Acute kidney failure, unspecified: Secondary | ICD-10-CM | POA: Diagnosis not present

## 2024-02-05 NOTE — Telephone Encounter (Signed)
 Contacted pt and informed her that Dr. Constance Goltz could see her on 02/12/24 for a hospital follow up, informed her that [facility] scheduled was completely full unless there was a cancellation

## 2024-02-12 ENCOUNTER — Ambulatory Visit (INDEPENDENT_AMBULATORY_CARE_PROVIDER_SITE_OTHER): Admitting: Family Medicine

## 2024-02-12 ENCOUNTER — Encounter: Payer: Self-pay | Admitting: Family Medicine

## 2024-02-12 VITALS — BP 124/68 | HR 84 | Ht 68.0 in | Wt 133.4 lb

## 2024-02-12 DIAGNOSIS — J9611 Chronic respiratory failure with hypoxia: Secondary | ICD-10-CM | POA: Diagnosis not present

## 2024-02-12 DIAGNOSIS — I2699 Other pulmonary embolism without acute cor pulmonale: Secondary | ICD-10-CM

## 2024-02-12 DIAGNOSIS — I2782 Chronic pulmonary embolism: Secondary | ICD-10-CM | POA: Diagnosis not present

## 2024-02-12 DIAGNOSIS — N3090 Cystitis, unspecified without hematuria: Secondary | ICD-10-CM

## 2024-02-12 DIAGNOSIS — F330 Major depressive disorder, recurrent, mild: Secondary | ICD-10-CM

## 2024-02-12 DIAGNOSIS — F411 Generalized anxiety disorder: Secondary | ICD-10-CM | POA: Diagnosis not present

## 2024-02-12 DIAGNOSIS — I088 Other rheumatic multiple valve diseases: Secondary | ICD-10-CM | POA: Diagnosis not present

## 2024-02-12 DIAGNOSIS — B0230 Zoster ocular disease, unspecified: Secondary | ICD-10-CM | POA: Diagnosis not present

## 2024-02-12 DIAGNOSIS — N179 Acute kidney failure, unspecified: Secondary | ICD-10-CM | POA: Diagnosis not present

## 2024-02-12 DIAGNOSIS — K5730 Diverticulosis of large intestine without perforation or abscess without bleeding: Secondary | ICD-10-CM | POA: Diagnosis not present

## 2024-02-12 MED ORDER — APIXABAN 5 MG PO TABS: 5.0000 mg | ORAL_TABLET | Freq: Two times a day (BID) | ORAL | 1 refills | Status: DC

## 2024-02-12 MED ORDER — BUPROPION HCL ER (XL) 150 MG PO TB24: 150.0000 mg | ORAL_TABLET | Freq: Every day | ORAL | 1 refills | Status: DC

## 2024-02-12 MED ORDER — GABAPENTIN 800 MG PO TABS: 800.0000 mg | ORAL_TABLET | Freq: Three times a day (TID) | ORAL | 1 refills | Status: DC

## 2024-02-12 MED ORDER — DULOXETINE HCL 30 MG PO CPEP: 90.0000 mg | ORAL_CAPSULE | Freq: Every day | ORAL | 1 refills | Status: DC

## 2024-02-12 NOTE — Assessment & Plan Note (Signed)
 Uses 3 L chronically.  Has a portable oxygen tank but did not wear it today.  Encouraged her to wear her oxygen at all times.

## 2024-02-12 NOTE — Patient Instructions (Addendum)
 It was nice to see you today,  We addressed the following topics today: -I am sending in your prescriptions as 90-day supplies to [place]. - I will see back in 6 months - Please wear your oxygen when going out. - I am rechecking your urine culture to make sure the infection is resolved.  Have a great day,  Frederic Jericho, MD

## 2024-02-12 NOTE — Assessment & Plan Note (Signed)
 Increasing gabapentin from 600-800 3 times daily.  Continue Cymbalta.

## 2024-02-12 NOTE — Assessment & Plan Note (Signed)
 Seen for follow-up today from hospitalization.  Needs a new refill of Eliquis.  Sending that in.  Asymptomatic today.

## 2024-02-12 NOTE — Addendum Note (Signed)
 Addended by: Sandre Kitty on: 02/12/2024 06:23 PM   Modules accepted: Level of Service

## 2024-02-12 NOTE — Progress Notes (Signed)
 Established Patient Office Visit  Subjective   Patient ID: Cindy Byrd, female    DOB: 04/02/1941  Age: 83 y.o. MRN: [id number]  Chief Complaint  Patient presents with   Hospitalization Follow-up    HPI  Hospital follow-up.-Patient here for follow-up of acute PE for which she was hospitalized.  Currently asymptomatic.  Is on 3 L oxygen chronically and has a portable tank but does not wear today.  Accompanied by her friend.  UTI-patient states she has finished her antibiotics and her symptoms have resolved.  We discussed how her antibiotic may not be effective for this particular bacteria.  She is agreeable to a repeat urine culture.  Patient needs new prescriptions for Eliquis Wellbutrin Cymbalta and an increased dose of gabapentin for which she takes for shingles that affected her right eye.  This has been ongoing for the past 1.5 years.  She sees a specialist at [facility] for this.   The ASCVD Risk score (Arnett DK, et al., 2019) failed to calculate for the following reasons:   The 2019 ASCVD risk score is only valid for ages 64 to 60  Health Maintenance Due  Topic Date Due   DTaP/Tdap/Td (1 - Tdap) Never done   COVID-19 Vaccine (3 - Pfizer risk series) 11/03/2020   Zoster Vaccines- Shingrix (2 of 2) 03/20/2023   Medicare Annual Wellness (AWV)  03/27/2024      Objective:     BP 124/68   Pulse 84   Ht 5\' 8"  (1.727 m)   Wt 133 lb 6.4 oz (60.5 kg)   SpO2 94%   BMI 20.28 kg/m    Physical Exam General: Alert and oriented.  No acute distress HEENT: Right cornea cloudy. CV: Regular rate and rhythm Pulmonary: Lungs clear bilaterally   No results found for any visits on 02/12/24.      Assessment & Plan:   Acute pulmonary embolism without acute cor pulmonale, unspecified pulmonary embolism type [facility]) Assessment & Plan: Seen for follow-up today from hospitalization.  Needs a new refill of Eliquis.  Sending that in.  Asymptomatic today.  Orders: -     Apixaban;  Take 1 tablet (5 mg total) by mouth 2 (two) times daily.  Dispense: 180 tablet; Refill: 1  Herpes zoster ophthalmicus of right eye Assessment & Plan: Increasing gabapentin from 600-800 3 times daily.  Continue Cymbalta.  Orders: -     Gabapentin; Take 1 tablet (800 mg total) by mouth 3 (three) times daily.  Dispense: 270 tablet; Refill: 1 -     DULoxetine HCl; Take 3 capsules (90 mg total) by mouth daily.  Dispense: 270 capsule; Refill: 1  Mild episode of recurrent major depressive disorder (HCC) -     buPROPion HCl ER (XL); Take 1 tablet (150 mg total) by mouth daily.  Dispense: 90 tablet; Refill: 1  GAD (generalized anxiety disorder) -     buPROPion HCl ER (XL); Take 1 tablet (150 mg total) by mouth daily.  Dispense: 90 tablet; Refill: 1 -     DULoxetine HCl; Take 3 capsules (90 mg total) by mouth daily.  Dispense: 270 capsule; Refill: 1  Chronic respiratory failure with hypoxia (HCC) Assessment & Plan: Uses 3 L chronically.  Has a portable oxygen tank but did not wear it today.  Encouraged her to wear her oxygen at all times.   Cystitis Assessment & Plan: Patient was prescribed a cephalosporin in the hospital and culture later showed Enterococcus faecalis.  Will get test of cure  today.  If bacteria still present we will send in ampicillin which was on the sensitivities list.  Orders: -     Urine Culture     Return in about 6 months (around 08/14/2024) for PE, chronic issues.    Sandre Kitty, MD

## 2024-02-12 NOTE — Assessment & Plan Note (Signed)
 Patient was prescribed a cephalosporin in the hospital and culture later showed Enterococcus faecalis.  Will get test of cure today.  If bacteria still present we will send in ampicillin which was on the sensitivities list.

## 2024-02-15 ENCOUNTER — Other Ambulatory Visit: Payer: Self-pay | Admitting: Family Medicine

## 2024-02-15 ENCOUNTER — Telehealth: Payer: Self-pay

## 2024-02-15 MED ORDER — NITROFURANTOIN MONOHYD MACRO 100 MG PO CAPS: 100.0000 mg | ORAL_CAPSULE | Freq: Two times a day (BID) | ORAL | 0 refills | Status: AC

## 2024-02-15 NOTE — Telephone Encounter (Signed)
 Copied from CRM [id number]. Topic: Clinical - Prescription Issue >> Feb 15, 2024  1:37 PM Martha Clan wrote: Reason for CRM: nitrofurantoin, macrocrystal-monohydrate, (MACROBID) 100 MG capsule Patient is receiving a procedure for shingle with a never block and cannot take antibiotics and is wondering if she can wait to take the new procedures.  Call back: [phone] or [phone]

## 2024-02-16 LAB — URINE CULTURE

## 2024-02-16 NOTE — Telephone Encounter (Signed)
 I spoke with the patient on the phone.  She is not having any urinary symptoms right now.  Advised her if her physician did not want her to take antibiotics prior to the procedure then we can hold off on starting antibiotics now since she is asymptomatic.  Advised her that after the procedure I would like to recheck her urine culture again before we start the antibiotic.  Advised her if she starts to have any urinary symptoms she should take the medication regardless and update [place] the physician performing her nerve block.[name]

## 2024-02-20 DIAGNOSIS — K5730 Diverticulosis of large intestine without perforation or abscess without bleeding: Secondary | ICD-10-CM | POA: Diagnosis not present

## 2024-02-20 DIAGNOSIS — N179 Acute kidney failure, unspecified: Secondary | ICD-10-CM | POA: Diagnosis not present

## 2024-02-20 DIAGNOSIS — N3090 Cystitis, unspecified without hematuria: Secondary | ICD-10-CM | POA: Diagnosis not present

## 2024-02-20 DIAGNOSIS — I088 Other rheumatic multiple valve diseases: Secondary | ICD-10-CM | POA: Diagnosis not present

## 2024-02-20 DIAGNOSIS — I2782 Chronic pulmonary embolism: Secondary | ICD-10-CM | POA: Diagnosis not present

## 2024-02-22 DIAGNOSIS — R519 Headache, unspecified: Secondary | ICD-10-CM | POA: Diagnosis not present

## 2024-02-22 DIAGNOSIS — B0222 Postherpetic trigeminal neuralgia: Secondary | ICD-10-CM | POA: Diagnosis not present

## 2024-02-23 ENCOUNTER — Other Ambulatory Visit: Payer: Self-pay

## 2024-02-23 DIAGNOSIS — C3491 Malignant neoplasm of unspecified part of right bronchus or lung: Secondary | ICD-10-CM

## 2024-02-26 ENCOUNTER — Inpatient Hospital Stay: Payer: Medicare Other | Attending: Hematology

## 2024-02-26 ENCOUNTER — Inpatient Hospital Stay: Payer: Medicare Other

## 2024-02-26 ENCOUNTER — Ambulatory Visit: Payer: Medicare Other

## 2024-02-26 VITALS — HR 100 | Resp 17

## 2024-02-26 VITALS — BP 103/57 | HR 91

## 2024-02-26 DIAGNOSIS — C155 Malignant neoplasm of lower third of esophagus: Secondary | ICD-10-CM

## 2024-02-26 DIAGNOSIS — C7951 Secondary malignant neoplasm of bone: Secondary | ICD-10-CM | POA: Diagnosis not present

## 2024-02-26 DIAGNOSIS — C3491 Malignant neoplasm of unspecified part of right bronchus or lung: Secondary | ICD-10-CM

## 2024-02-26 DIAGNOSIS — C3490 Malignant neoplasm of unspecified part of unspecified bronchus or lung: Secondary | ICD-10-CM | POA: Diagnosis not present

## 2024-02-26 DIAGNOSIS — K521 Toxic gastroenteritis and colitis: Secondary | ICD-10-CM

## 2024-02-26 DIAGNOSIS — Z452 Encounter for adjustment and management of vascular access device: Secondary | ICD-10-CM | POA: Insufficient documentation

## 2024-02-26 DIAGNOSIS — Z95828 Presence of other vascular implants and grafts: Secondary | ICD-10-CM

## 2024-02-26 DIAGNOSIS — R197 Diarrhea, unspecified: Secondary | ICD-10-CM

## 2024-02-26 DIAGNOSIS — Z7189 Other specified counseling: Secondary | ICD-10-CM

## 2024-02-26 DIAGNOSIS — K529 Noninfective gastroenteritis and colitis, unspecified: Secondary | ICD-10-CM

## 2024-02-26 LAB — CBC WITH DIFFERENTIAL (CANCER CENTER ONLY)
Abs Immature Granulocytes: 0.07 10*3/uL (ref 0.00–0.07)
Basophils Absolute: 0.1 10*3/uL (ref 0.0–0.1)
Basophils Relative: 1 %
Eosinophils Absolute: 0.5 10*3/uL (ref 0.0–0.5)
Eosinophils Relative: 4 %
HCT: 40 % (ref 36.0–46.0)
Hemoglobin: 13 g/dL (ref 12.0–15.0)
Immature Granulocytes: 1 %
Lymphocytes Relative: 20 %
Lymphs Abs: 2.5 10*3/uL (ref 0.7–4.0)
MCH: 31.8 pg (ref 26.0–34.0)
MCHC: 32.5 g/dL (ref 30.0–36.0)
MCV: 97.8 fL (ref 80.0–100.0)
Monocytes Absolute: 1.2 10*3/uL — ABNORMAL HIGH (ref 0.1–1.0)
Monocytes Relative: 10 %
Neutro Abs: 8.1 10*3/uL — ABNORMAL HIGH (ref 1.7–7.7)
Neutrophils Relative %: 64 %
Platelet Count: 334 10*3/uL (ref 150–400)
RBC: 4.09 MIL/uL (ref 3.87–5.11)
RDW: 15.8 % — ABNORMAL HIGH (ref 11.5–15.5)
WBC Count: 12.5 10*3/uL — ABNORMAL HIGH (ref 4.0–10.5)
nRBC: 0 % (ref 0.0–0.2)

## 2024-02-26 LAB — CMP (CANCER CENTER ONLY)
ALT: 15 U/L (ref 0–44)
AST: 23 U/L (ref 15–41)
Albumin: 3.5 g/dL (ref 3.5–5.0)
Alkaline Phosphatase: 46 U/L (ref 38–126)
Anion gap: 6 (ref 5–15)
BUN: 30 mg/dL — ABNORMAL HIGH (ref 8–23)
CO2: 26 mmol/L (ref 22–32)
Calcium: 9.7 mg/dL (ref 8.9–10.3)
Chloride: 105 mmol/L (ref 98–111)
Creatinine: 1.3 mg/dL — ABNORMAL HIGH (ref 0.44–1.00)
GFR, Estimated: 41 mL/min — ABNORMAL LOW (ref >60)
Glucose, Bld: 91 mg/dL (ref 70–99)
Potassium: 4 mmol/L (ref 3.5–5.1)
Sodium: 137 mmol/L (ref 135–145)
Total Bilirubin: 0.3 mg/dL (ref 0.0–1.2)
Total Protein: 7.7 g/dL (ref 6.5–8.1)

## 2024-02-26 MED ORDER — DENOSUMAB 120 MG/1.7ML ~~LOC~~ SOLN
120.0000 mg | Freq: Once | SUBCUTANEOUS | Status: DC
  Filled 2024-02-26: qty 1.7

## 2024-02-26 MED ORDER — HEPARIN SOD (PORK) LOCK FLUSH 100 UNIT/ML IV SOLN
500.0000 [IU] | Freq: Once | INTRAVENOUS | Status: AC
  Administered 2024-02-26: 500 [IU]

## 2024-02-26 MED ORDER — SODIUM CHLORIDE 0.9% FLUSH
10.0000 mL | Freq: Once | INTRAVENOUS | Status: AC
  Administered 2024-02-26: 10 mL

## 2024-02-26 NOTE — Progress Notes (Signed)
 Patient is here for Xgeva injection. Pt's BP was 99/58 then recheck BP was 103/57. She said she "felt" like it was low. Denies N/V/D. Reports dizziness and just "wanting to go asleep." Messaged Beth, RN to verify if we were okay to proceed with injection. She added Dr. Candise Che to secure chat, who advised "if she is feeling unwell-- would hold Xgeva dose today. Can be seen in [facility] to assess her other symptoms or by her PCP." Rivka Harshika held. Spoke to patient and notified her of what the provider advised. Pt stated that she would follow up with PCP. Provider and [place], [place] [facility]) notified of this. Also advised patient to increase her water intake. She stated that she didn't drink much water and that Coke was her favorite drink. Pt verbalized understanding. In main lobby, in wheelchair, has driver to take her home.

## 2024-02-27 DIAGNOSIS — N179 Acute kidney failure, unspecified: Secondary | ICD-10-CM | POA: Diagnosis not present

## 2024-02-27 DIAGNOSIS — I2782 Chronic pulmonary embolism: Secondary | ICD-10-CM | POA: Diagnosis not present

## 2024-02-27 DIAGNOSIS — I088 Other rheumatic multiple valve diseases: Secondary | ICD-10-CM | POA: Diagnosis not present

## 2024-02-27 DIAGNOSIS — K5730 Diverticulosis of large intestine without perforation or abscess without bleeding: Secondary | ICD-10-CM | POA: Diagnosis not present

## 2024-02-27 DIAGNOSIS — N3090 Cystitis, unspecified without hematuria: Secondary | ICD-10-CM | POA: Diagnosis not present

## 2024-03-07 ENCOUNTER — Other Ambulatory Visit

## 2024-03-07 ENCOUNTER — Other Ambulatory Visit: Payer: Self-pay | Admitting: *Deleted

## 2024-03-07 DIAGNOSIS — R300 Dysuria: Secondary | ICD-10-CM

## 2024-03-08 ENCOUNTER — Telehealth: Payer: Self-pay | Admitting: *Deleted

## 2024-03-08 NOTE — Telephone Encounter (Signed)
 Copied from CRM [id number]. Topic: Clinical - Lab/Test Results >> Mar 08, 2024 11:16 AM Alessandra Bevels wrote: Reason for CRM: Patient is calling for the results of her Urine Culture (Order [id number])

## 2024-03-11 ENCOUNTER — Other Ambulatory Visit: Payer: Self-pay | Admitting: Family Medicine

## 2024-03-11 MED ORDER — CEFDINIR 300 MG PO CAPS: 300.0000 mg | ORAL_CAPSULE | Freq: Two times a day (BID) | ORAL | 0 refills | Status: AC

## 2024-03-12 LAB — URINE CULTURE

## 2024-03-21 ENCOUNTER — Ambulatory Visit (INDEPENDENT_AMBULATORY_CARE_PROVIDER_SITE_OTHER)

## 2024-03-21 ENCOUNTER — Ambulatory Visit (INDEPENDENT_AMBULATORY_CARE_PROVIDER_SITE_OTHER): Payer: Medicare Other | Admitting: Family Medicine

## 2024-03-21 VITALS — BP 95/63 | HR 99 | Ht 68.0 in | Wt 133.0 lb

## 2024-03-21 DIAGNOSIS — L304 Erythema intertrigo: Secondary | ICD-10-CM | POA: Diagnosis not present

## 2024-03-21 MED ORDER — NYSTATIN 100000 UNIT/GM EX CREA: 1.0000 | TOPICAL_CREAM | Freq: Two times a day (BID) | CUTANEOUS | 1 refills | Status: AC

## 2024-03-21 NOTE — Patient Instructions (Addendum)
 It was nice to see you today,  We addressed the following topics today: -Your rash is called intertrigo.  I would like you to do the following: 1.  In the morning after you have showered and dried off, apply the antifungal nystatin cream to the rash.  Then apply a layer of Vaseline over the nystatin cream. 2.  You can then use the nonabsorbent pads that you can find over-the-counter at your [facility] to put in between your skin and the bra 3.  Try to keep the area dry during the day.  If you start to sweat change bras or pat the area down with a towel. 4.  Apply the cream twice a day for at least 2 weeks or or 7 days after the rash is healed.  Have a great day,  Etha Henle, MD

## 2024-03-25 DIAGNOSIS — L304 Erythema intertrigo: Secondary | ICD-10-CM | POA: Insufficient documentation

## 2024-03-25 NOTE — Progress Notes (Signed)
 Patient came in today complaining of a rash underneath her breast.  I evaluated the patient and her rash is consistent with intertrigo.  We discussed treatment with antifungal creams.  Sent in prescription

## 2024-04-01 DIAGNOSIS — B0222 Postherpetic trigeminal neuralgia: Secondary | ICD-10-CM | POA: Diagnosis not present

## 2024-04-01 DIAGNOSIS — R519 Headache, unspecified: Secondary | ICD-10-CM | POA: Diagnosis not present

## 2024-04-01 NOTE — Progress Notes (Signed)
 Nystatin cream sent in

## 2024-04-04 ENCOUNTER — Encounter

## 2024-04-08 ENCOUNTER — Telehealth: Payer: Self-pay | Admitting: Hematology

## 2024-04-08 NOTE — Telephone Encounter (Signed)
 Cindy Byrd called in to reschedule her appointment. I informed Cindy Byrd that Dr. Salomon Cree is booked on the requested days she asked for. Cindy Byrd decided to keep her appointment scheduled as is.

## 2024-04-09 NOTE — Progress Notes (Incomplete)
 HEMATOLOGY ONCOLOGY CLINIC VISIT NOTE  Date of service:  04/10/2024   Patient Care Team: Laneta Pintos, MD as PCP - General (Family Medicine) Frankie Israel, MD as Consulting Physician (Hematology and Oncology)  CHIEF COMPLAINTS/PURPOSE OF CONSULTATION:  Follow-up for continued evaluation and management of metastatic lung cancer Follow-up for esophageal cancer  DIAGNOSIS:   #1 Metastatic non-small cell lung cancer with bilateral lung nodules and large metastatic lesion in the left Ilium. 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 Also had c diff colitis - treated   Current Treatment  1) Active surveillance 2) Xgeva  120mg  [place] q12weeks for bone metastases. 3) Sandostatin  q4weeks for diarrhea   Previous Treatment  For metastatic lung cancer 1 Palliative radiation therapy to the large left ilium metastases 2. IV Nivolumab  x 20 cycles (discontinued due to likely immune colitis) 3. Xgeva  120mg  [place] q4weeks for bone metastases.  For Esophageal adenocarcinoma S/p Concurrent carbo/taxol  + RT  HISTORY OF PRESENTING ILLNESS: (plz see my previous consultation for details of initial presentation)  INTERVAL HISTORY:   Cindy Byrd is a 83 y.o. female here for continued evaluation and management of metastatic lung cancer and follow-up for esophageal cancer.   Patient was last seen by me on 01/10/2024 and reported right eye pain, right eye blindness, numbness in the right side of her head, warmth in right forehead sometimes, 3-pound weight loss since November with normal appetite, persistent cough with green phlegm, pain on her left side when coughing, and frequent low moods.  Today,  -Discussed lab results on 04/10/2024 in detail with patient. CBC showed WBC of ***K, hemoglobin of ***, and platelets of ***K. -   postherpetic neuralgia.   She uses her portable oxygen  tank regularly at night and as needed during  the daytime.  She will have a procedure to ablate the nerves at [facility] on February 22, 2024.   Tessalon  Perles  Patient continues to follow with Dr. Kimble Pennant.   MEDICAL HISTORY:  Past Medical History:  Diagnosis Date   Barrett's esophagus    Bilateral pulmonary embolism (HCC) 09/02/2016   09/02/16 bilateral pulmonary emboli in context of extensive bilateral lower extremity deep venous thromboses Assumed hypercoagulability due to non-small cell metastatic lung cancer Lifelong anticoagulation recommended   Bone neoplasm 06/24/2015   Cancer (HCC)    metastatic poorly differentiated carcinoma. tumor left groin surgical removal with radiation tx.   Cataract    BILATERAL   Cigarette smoker two packs a day or less    Currently still smoking 2 PPD - Not interested in quitting at this time.   Colitis 2017   Colon polyps    hyperplastic, tubular adenomas, tubulovillous adenoma   Cough, persistent    hx. lung cancer ? primary-being evaluated, unsure of primary site.   Depression 06/24/2015   Diverticulosis    DVT of lower extremity, bilateral (HCC) 09/02/2016   Dysrhythmia    PAF in setting of submassive PE 09/2016   Emphysema of lung (HCC)    Endometriosis    Hysterectomy with BSO at age 44 yrs   Esophageal adenocarcinoma (HCC) 08/11/2015   intramucosal   Gastritis    GERD (gastroesophageal reflux disease)    H/O: pneumonia    Hiatal hernia    Hyperglycemia    A1c 7.4% 06/20/22   Hyperlipidemia    Hypertension 06/24/2015   likely improved incidental to 40 lbs weight loss from her neoplasm. No Longer taking med  for this as of 08-06-15   IBS (irritable bowel syndrome)    Lung cancer (HCC)    suspected IV lung cancer with left ileum mets 2017, s/p left ileum radiaiton, immunotherapy   Pain    left hip-persistent"tumor of bone"-radiation tx. 10.   Pneumonia    Vitamin D  deficiency disease    SURGICAL HISTORY: Past Surgical History:  Procedure Laterality Date   ABDOMINAL HYSTERECTOMY      BALLOON DILATION N/A 10/08/2019   Procedure: BALLOON DILATION;  Surgeon: Nannette Babe, MD;  Location: [facility];  Service: Gastroenterology;  Laterality: N/A;   BARTHOLIN GLAND CYST EXCISION  83 yo ago   Does not want if it was an infected cyst or tumor. Was soon as delivery   BIOPSY  01/02/2019   Procedure: BIOPSY;  Surgeon: Nannette Babe, MD;  Location: [facility];  Service: Gastroenterology;;   CATARACT EXTRACTION     COLONOSCOPY W/ POLYPECTOMY     multiple times - last done 09/2014 per patient.   ESOPHAGOGASTRODUODENOSCOPY (EGD) WITH PROPOFOL  N/A 08/11/2015   Procedure: ESOPHAGOGASTRODUODENOSCOPY (EGD) WITH PROPOFOL ;  Surgeon: Nannette Babe, MD;  Location: [facility];  Service: Gastroenterology;  Laterality: N/A;   ESOPHAGOGASTRODUODENOSCOPY (EGD) WITH PROPOFOL  N/A 01/02/2019   Procedure: ESOPHAGOGASTRODUODENOSCOPY (EGD) WITH PROPOFOL ;  Surgeon: Nannette Babe, MD;  Location: [facility];  Service: Gastroenterology;  Laterality: N/A;   ESOPHAGOGASTRODUODENOSCOPY (EGD) WITH PROPOFOL  N/A 10/08/2019   Procedure: ESOPHAGOGASTRODUODENOSCOPY (EGD) WITH PROPOFOL ;  Surgeon: Nannette Babe, MD;  Location: [facility];  Service: Gastroenterology;  Laterality: N/A;   EYE SURGERY Right 2024   Multiple surgeries over the past year.   FLEXIBLE SIGMOIDOSCOPY N/A 06/24/2017   Procedure: FLEXIBLE SIGMOIDOSCOPY;  Surgeon: Danette Duos, MD;  Location: [facility];  Service: Gastroenterology;  Laterality: N/A;   GANGLION CYST EXCISION     KNEE ARTHROSCOPY  age about 52 yrs   TONSILLECTOMY     TOTAL ABDOMINAL HYSTERECTOMY W/ BILATERAL SALPINGOOPHORECTOMY  at age 27 yrs   For endometriosis   VIDEO BRONCHOSCOPY WITH ENDOBRONCHIAL ULTRASOUND Bilateral 06/06/2023   Procedure: VIDEO BRONCHOSCOPY WITH ENDOBRONCHIAL ULTRASOUND;  Surgeon: Prudy Brownie, DO;  Location: [facility];  Service: Cardiopulmonary;  Laterality: Bilateral;    SOCIAL HISTORY: Social History   Socioeconomic  History   Marital status: Widowed    Spouse name: Not on file   Number of children: 2   Years of education: Not on file   Highest education level: Not on file  Occupational History   Not on file  Tobacco Use   Smoking status: Former    Current packs/day: 0.00    Average packs/day: 1 pack/day for 60.0 years (60.0 ttl pk-yrs)    Types: Cigarettes    Start date: 12/05/1954    Quit date: 12/05/2014    Years since quitting: 9.3    Passive exposure: Never   Smokeless tobacco: Never  Vaping Use   Vaping status: Never Used  Substance and Sexual Activity   Alcohol use: No    Alcohol/week: 0.0 standard drinks of alcohol   Drug use: No   Sexual activity: Not Currently  Other Topics Concern   Not on file  Social History Narrative   Not on file   Social Drivers of Health   Financial Resource Strain: Low Risk  (03/28/2023)   Overall Financial Resource Strain (CARDIA)    Difficulty of Paying Living Expenses: Not hard at all  Food Insecurity: No Food Insecurity (02/02/2024)   Hunger Vital Sign  Worried About Programme researcher, broadcasting/film/video in the Last Year: Never true    Ran Out of Food in the Last Year: Never true  Transportation Needs: No Transportation Needs (02/02/2024)   PRAPARE - Administrator, Civil Service (Medical): No    Lack of Transportation (Non-Medical): No  Physical Activity: Inactive (03/28/2023)   Exercise Vital Sign    Days of Exercise per Week: 0 days    Minutes of Exercise per Session: 0 min  Stress: No Stress Concern Present (03/28/2023)   [employer] of Occupational Health - Occupational Stress Questionnaire    Feeling of Stress : Not at all  Social Connections: Socially Isolated (01/31/2024)   Social Connection and Isolation Panel [NHANES]    Frequency of Communication with Friends and Family: Three times a week    Frequency of Social Gatherings with Friends and Family: Once a week    Attends Religious Services: Never    Database administrator or  Organizations: No    Attends Banker Meetings: Never    Marital Status: Widowed  Intimate Partner Violence: Not At Risk (02/02/2024)   Humiliation, Afraid, Rape, and Kick questionnaire    Fear of Current or Ex-Partner: No    Emotionally Abused: No    Physically Abused: No    Sexually Abused: No    FAMILY HISTORY: Family History  Problem Relation Age of Onset   Colon cancer Brother    Colon cancer Brother    Stroke Mother    Colon cancer Father    Emphysema Father        smoked   Breast cancer Daughter 25       ER/PR+ stage II    ALLERGIES:  is allergic to penicillins, remeron  [mirtazapine ], and latex. patient wonders if she has a penicillin allergy but notes that she is uncertain about this.  MEDICATIONS:  Current Outpatient Medications  Medication Sig Dispense Refill   acetaminophen  (TYLENOL ) 325 MG tablet Take 2 tablets (650 mg total) by mouth every 6 (six) hours as needed for mild pain (or Fever >/= 101). 20 tablet 0   apixaban  (ELIQUIS ) 5 MG TABS tablet Take 1 tablet (5 mg total) by mouth 2 (two) times daily. 180 tablet 1   Budeson-Glycopyrrol-Formoterol  (BREZTRI  AEROSPHERE) 160-9-4.8 MCG/ACT AERO INHALE 2 PUFFS INTO THE LUNGS TWICE A DAY 10.7 each 1   buPROPion  (WELLBUTRIN  XL) 150 MG 24 hr tablet Take 1 tablet (150 mg total) by mouth daily. 90 tablet 1   DULoxetine  (CYMBALTA ) 30 MG capsule Take 3 capsules (90 mg total) by mouth daily. 270 capsule 1   fentaNYL  (DURAGESIC ) 25 MCG/HR Place 1 patch onto the skin every 3 (three) days. 10 patch 0   gabapentin  (NEURONTIN ) 800 MG tablet Take 1 tablet (800 mg total) by mouth 3 (three) times daily. 270 tablet 1   HYDROcodone -acetaminophen  (NORCO) 5-325 MG tablet Take 1 tablet by mouth every 6 (six) hours as needed for moderate pain. 30 tablet 0   nystatin  cream (MYCOSTATIN ) Apply 1 Application topically 2 (two) times daily. Apply to rash 4 times daily for 2 weeks. 30 g 1   omeprazole  (PRILOSEC) 40 MG capsule Take 1  capsule (40 mg total) by mouth daily before breakfast. 30 capsule 3   ondansetron  (ZOFRAN -ODT) 4 MG disintegrating tablet Take 1 tablet (4 mg total) by mouth every 8 (eight) hours as needed for nausea or vomiting. 20 tablet 0   traZODone  (DESYREL ) 100 MG tablet Take 100 mg by mouth  at bedtime as needed for sleep.     valACYclovir  (VALTREX ) 500 MG tablet TAKE 1 TABLET BY MOUTH TWICE A DAY 180 tablet 3   No current facility-administered medications for this visit.   REVIEW OF SYSTEMS:  10 Point review of Systems was done is negative except as noted above.   PHYSICAL EXAMINATION:  .There were no vitals taken for this visit.   GENERAL:alert, in no acute distress and comfortable SKIN: no acute rashes, no significant lesions EYES: conjunctiva are pink and non-injected, sclera anicteric OROPHARYNX: MMM, no exudates, no oropharyngeal erythema or ulceration NECK: supple, no JVD LYMPH:  no palpable lymphadenopathy in the cervical, axillary or inguinal regions LUNGS: clear to auscultation b/l with normal respiratory effort HEART: regular rate & rhythm ABDOMEN:  normoactive bowel sounds , non tender, not distended. Extremity: no pedal edema PSYCH: alert & oriented x 3 with fluent speech NEURO: no focal motor/sensory deficits    LABORATORY DATA:  I have reviewed the data as listed  .    Latest Ref Rng & Units 02/26/2024    9:16 AM 01/31/2024    4:33 AM 01/30/2024    2:18 PM  CBC  WBC 4.0 - 10.5 K/uL 12.5  12.0  5.7   Hemoglobin 12.0 - 15.0 g/dL 40.9  81.1  91.4   Hematocrit 36.0 - 46.0 % 40.0  40.8  41.6   Platelets 150 - 400 K/uL 334  257  280    ANC 1.8k .    Latest Ref Rng & Units 02/26/2024    9:16 AM 01/31/2024    4:33 AM 01/30/2024    2:18 PM  CMP  Glucose 70 - 99 mg/dL 91  96  782   BUN 8 - 23 mg/dL 30  20  22    Creatinine 0.44 - 1.00 mg/dL 9.56  2.13  0.86   Sodium 135 - 145 mmol/L 137  137  137   Potassium 3.5 - 5.1 mmol/L 4.0  4.6  4.7   Chloride 98 - 111 mmol/L 105   103  102   CO2 22 - 32 mmol/L 26  26  27    Calcium  8.9 - 10.3 mg/dL 9.7  7.9  8.8   Total Protein 6.5 - 8.1 g/dL 7.7   7.6   Total Bilirubin 0.0 - 1.2 mg/dL 0.3   0.4   Alkaline Phos 38 - 126 U/L 46   61   AST 15 - 41 U/L 23   25   ALT 0 - 44 U/L 15   12         01/02/19 Esophagus Biopsy:    RADIOGRAPHIC STUDIES: .No results found.  ASSESSMENT & PLAN:   83 y.o. female with  #1 Metastatic poorly differentiated carcinoma with likely lung primary non-small cell lung cancer.    CT of the head with and without contrast showed no evidence of metastatic disease. EGFR blood test mutation analysis negative. CT chest abdomen pelvis 04/19/2016 shows no evidence of disease progression. Patient tolerated Nivolumab  very well but was discontinued when she developed grade 2 Immune colitis. Has been off Nivolumab  for >6 months  CT chest abdomen pelvis on 06/24/2016 shows no evidence of new disease or progression of metastatic disease. CT chest abdomen pelvis 09/06/2016 shows 1. Mixed interval response to therapy. 2. There is a new left ventral chest wall lesion deep to the pectoralis musculature worrisome for metastatic disease. 3. Posterior lower lobe nodular densities are identified which may reflect areas of pulmonary metastasis. 4.  Interval decrease in size of destructive lesion involving the left iliac bone.  CT chest abd pelvis 12/08/2016: Cystic mass involving the left ventral chest wall has resolved in the interval. Likely was a hematoma due to trauma. Interval increase in size of pleural base mass overlying the posterior and inferior left lower lobe. There is also a new left pleural effusion identified.  CT chest 02/01/2017: Residual irregular soft tissue thickening/volume loss and trace left pleural fluid at the base of the left hemithorax, overall improved in appearance from 12/08/2016. No measurable lesion.  CT chest 05/29/2017 shows no residual pleural based mass or significant pleural  effusion in the left hemithorax. No evidence of thoracic metastatic disease. No evidence of progressive metastatic disease within the abdomen or pelvis. Mixed lytic and blastic lesion involving the left iliac bone and associated pathologic fracture are unchanged.   CT CAP 09/14/17 shows no new changes. She does have slight displacement of her fractured left iliac bone. Evidence of stable disease.   CT CAP 01/04/2018- No new or progressive metastatic disease. Stable large left iliac bone metastasis with associated chronic pathologic fracture.   CT chest/abd/pelvis done on 04/26/18 revealed Stable exam.  No new or progressive interval findings.  07/19/18 CT C/A/P revealed Stable exam.  No new or progressive interval findings. Large destructive left iliac lesion is similar to prior. Aortic Atherosclerosis and Emphysema.    11/06/18 CT C/A/P revealed Similar appearance of large mixed lytic and sclerotic lesion in the left ilium. No new metastatic lesions are otherwise noted elsewhere in the chest, abdomen or pelvis. 2. Interval development of thickening of the distal third of the esophagus. This is nonspecific, and could be related to underlying reflux esophagitis. However, if there is any clinical concern for Barrett's metaplasia or esophageal neoplasia, further evaluation with nonemergent endoscopy could be considered. 3. Aortic atherosclerosis, in addition to left main coronary artery disease. Assessment for potential risk factor modification, dietary therapy or pharmacologic therapy may be warranted, if clinically Indicated. 4. Diffuse bronchial wall thickening with mild to moderate centrilobular and paraseptal emphysema; imaging findings suggestive of underlying COPD. 5. Additional incidental findings, as above.  #2  Adenocarcinoma of the Esophagus  Barrett's esophagus 4cms in the distal esophagus with low and high-grade dysplasia  01/02/19 Surgical pathology revealed adenocarcinoma of the esophagus    01/25/19 PET/CT revealed Distal esophageal primary, without hypermetabolic metastatic disease. 2. Chronic left iliac metastasis, as before. 3. Hypermetabolism within and superficial to the right gluteal musculature is most likely related to trauma and/or injection sites. 4. Aortic atherosclerosis, coronary artery atherosclerosis and emphysema.  S/p concurrent Carboplatin  and Taxol  weekly with RT of 45 Gy in 25 fractions and 5.4 Gy boost, completed between 02/04/19 and 03/27/19  07/03/2019 PET skull base to thigh revealed "1. Interval response to therapy. Significant reduction in FDG uptake associated with distal esophageal mass. SUV max currently 2.61 versus 16.97 previously. 2. 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 Unchanged 3. Aortic Atherosclerosis (ICD10-I70.0) and Emphysema (ICD10-J43.9). Coronary artery calcifications."  07/15/2020 CT C/A/P ([id number]) ([id number]) revealed "1. No evidence of new or progressive metastatic disease in the chest, abdomen or pelvis."  #3 diarrhea-  now resolved was previously. 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 She was previously on on Lialda , budesonide ,probiotics and lomotil  but not currently taking any of these. Plan -Continue Sandostatin  every 4 weeks   #4  h/o DVT and PE  -continue on Xarelto  - no issues with bleeding   #5 history of COPD management per primary care physician  #6 severe shingles of the forehead with postherpetic neuralgia  PLAN:  -Discussed lab results on 01/10/24 in detail with patient. CBC showed WBC of 13.3K, hemoglobin of 12.3, and platelets of 330K. -possible that elevated WBCs are due to bacterial infection -will order a short course of 100 MG doxycycline  to clear possible bacterial infection -generally would recommend avoiding suppressing a cough if it is infectious so that it can  be cleared out -discussed that it is possible that she may have strained a muscle from coughing -patient has upcoming chest CT scan with pulmonology -recommend connecting with pulmonology to see if she can been seen sooner -Continue Xgeva  every 3 months  -will refill fentanyl  patch for post herpetic neuralgia -will order Prilosec -answered all of patient's questions in detail  FOLLOW-UP: ***  The total time spent in the appointment was *** minutes* .  All of the patient's questions were answered with apparent satisfaction. The patient knows to call the clinic with any problems, questions or concerns.   Jacquelyn Matt MD MS AAHIVMS [facility] Hematology/Oncology Physician [facility]  .*Total Encounter Time as defined by the Centers for Medicare and Medicaid Services includes, in addition to the face-to-face time of a patient visit (documented in the note above) non-face-to-face time: obtaining and reviewing outside history, ordering and reviewing medications, tests or procedures, care coordination (communications with other health care professionals or caregivers) and documentation in the medical record.    I,Mitra Faeizi,acting as a Neurosurgeon for Jacquelyn Matt, MD.,have documented all relevant documentation on the behalf of Jacquelyn Matt, MD,as directed by  Jacquelyn Matt, MD while in the presence of Jacquelyn Matt, MD.  ***

## 2024-04-10 ENCOUNTER — Inpatient Hospital Stay

## 2024-04-10 ENCOUNTER — Inpatient Hospital Stay: Payer: Medicare Other

## 2024-04-10 ENCOUNTER — Other Ambulatory Visit: Payer: Self-pay

## 2024-04-10 ENCOUNTER — Inpatient Hospital Stay: Payer: Medicare Other | Attending: Hematology | Admitting: Hematology

## 2024-04-10 VITALS — BP 96/63 | HR 100 | Temp 97.9°F | Resp 18 | Ht 68.0 in | Wt 133.7 lb

## 2024-04-10 DIAGNOSIS — C3490 Malignant neoplasm of unspecified part of unspecified bronchus or lung: Secondary | ICD-10-CM

## 2024-04-10 DIAGNOSIS — K529 Noninfective gastroenteritis and colitis, unspecified: Secondary | ICD-10-CM

## 2024-04-10 DIAGNOSIS — R197 Diarrhea, unspecified: Secondary | ICD-10-CM

## 2024-04-10 DIAGNOSIS — Z87891 Personal history of nicotine dependence: Secondary | ICD-10-CM | POA: Insufficient documentation

## 2024-04-10 DIAGNOSIS — Z86718 Personal history of other venous thrombosis and embolism: Secondary | ICD-10-CM | POA: Diagnosis not present

## 2024-04-10 DIAGNOSIS — Z7189 Other specified counseling: Secondary | ICD-10-CM

## 2024-04-10 DIAGNOSIS — C155 Malignant neoplasm of lower third of esophagus: Secondary | ICD-10-CM

## 2024-04-10 DIAGNOSIS — C159 Malignant neoplasm of esophagus, unspecified: Secondary | ICD-10-CM | POA: Insufficient documentation

## 2024-04-10 DIAGNOSIS — C7951 Secondary malignant neoplasm of bone: Secondary | ICD-10-CM | POA: Insufficient documentation

## 2024-04-10 DIAGNOSIS — Z7901 Long term (current) use of anticoagulants: Secondary | ICD-10-CM | POA: Diagnosis not present

## 2024-04-10 DIAGNOSIS — Z86711 Personal history of pulmonary embolism: Secondary | ICD-10-CM | POA: Insufficient documentation

## 2024-04-10 DIAGNOSIS — Z79624 Long term (current) use of inhibitors of nucleotide synthesis: Secondary | ICD-10-CM | POA: Diagnosis not present

## 2024-04-10 DIAGNOSIS — C3491 Malignant neoplasm of unspecified part of right bronchus or lung: Secondary | ICD-10-CM

## 2024-04-10 DIAGNOSIS — K521 Toxic gastroenteritis and colitis: Secondary | ICD-10-CM

## 2024-04-10 DIAGNOSIS — Z79899 Other long term (current) drug therapy: Secondary | ICD-10-CM | POA: Insufficient documentation

## 2024-04-10 DIAGNOSIS — R911 Solitary pulmonary nodule: Secondary | ICD-10-CM

## 2024-04-10 DIAGNOSIS — Z95828 Presence of other vascular implants and grafts: Secondary | ICD-10-CM

## 2024-04-10 LAB — CBC WITH DIFFERENTIAL (CANCER CENTER ONLY)
Abs Immature Granulocytes: 0.03 10*3/uL (ref 0.00–0.07)
Basophils Absolute: 0.1 10*3/uL (ref 0.0–0.1)
Basophils Relative: 1 %
Eosinophils Absolute: 0.2 10*3/uL (ref 0.0–0.5)
Eosinophils Relative: 2 %
HCT: 41.6 % (ref 36.0–46.0)
Hemoglobin: 13.7 g/dL (ref 12.0–15.0)
Immature Granulocytes: 0 %
Lymphocytes Relative: 22 %
Lymphs Abs: 1.8 10*3/uL (ref 0.7–4.0)
MCH: 31.4 pg (ref 26.0–34.0)
MCHC: 32.9 g/dL (ref 30.0–36.0)
MCV: 95.2 fL (ref 80.0–100.0)
Monocytes Absolute: 0.8 10*3/uL (ref 0.1–1.0)
Monocytes Relative: 9 %
Neutro Abs: 5.5 10*3/uL (ref 1.7–7.7)
Neutrophils Relative %: 66 %
Platelet Count: 264 10*3/uL (ref 150–400)
RBC: 4.37 MIL/uL (ref 3.87–5.11)
RDW: 14.8 % (ref 11.5–15.5)
WBC Count: 8.4 10*3/uL (ref 4.0–10.5)
nRBC: 0 % (ref 0.0–0.2)

## 2024-04-10 LAB — CMP (CANCER CENTER ONLY)
ALT: 30 U/L (ref 0–44)
AST: 42 U/L — ABNORMAL HIGH (ref 15–41)
Albumin: 3.8 g/dL (ref 3.5–5.0)
Alkaline Phosphatase: 68 U/L (ref 38–126)
Anion gap: 8 (ref 5–15)
BUN: 25 mg/dL — ABNORMAL HIGH (ref 8–23)
CO2: 25 mmol/L (ref 22–32)
Calcium: 9.6 mg/dL (ref 8.9–10.3)
Chloride: 106 mmol/L (ref 98–111)
Creatinine: 1.3 mg/dL — ABNORMAL HIGH (ref 0.44–1.00)
GFR, Estimated: 41 mL/min — ABNORMAL LOW (ref >60)
Glucose, Bld: 91 mg/dL (ref 70–99)
Potassium: 3.9 mmol/L (ref 3.5–5.1)
Sodium: 139 mmol/L (ref 135–145)
Total Bilirubin: 0.2 mg/dL (ref 0.0–1.2)
Total Protein: 7.9 g/dL (ref 6.5–8.1)

## 2024-04-10 MED ORDER — DENOSUMAB 120 MG/1.7ML ~~LOC~~ SOLN
120.0000 mg | Freq: Once | SUBCUTANEOUS | Status: AC
  Administered 2024-04-10: 120 mg via SUBCUTANEOUS
  Filled 2024-04-10: qty 1.7

## 2024-04-10 MED ORDER — HEPARIN SOD (PORK) LOCK FLUSH 100 UNIT/ML IV SOLN
250.0000 [IU] | Freq: Once | INTRAVENOUS | Status: AC
  Administered 2024-04-10: 250 [IU]

## 2024-04-10 MED ORDER — SODIUM CHLORIDE 0.9% FLUSH
10.0000 mL | Freq: Once | INTRAVENOUS | Status: AC
  Administered 2024-04-10: 10 mL

## 2024-04-12 ENCOUNTER — Other Ambulatory Visit: Payer: Self-pay | Admitting: Hematology

## 2024-04-12 ENCOUNTER — Encounter: Payer: Self-pay | Admitting: Hematology

## 2024-04-12 ENCOUNTER — Other Ambulatory Visit: Payer: Self-pay | Admitting: *Deleted

## 2024-04-12 DIAGNOSIS — Z95828 Presence of other vascular implants and grafts: Secondary | ICD-10-CM

## 2024-04-12 MED ORDER — HYDROCODONE-ACETAMINOPHEN 5-325 MG PO TABS: 1.0000 | ORAL_TABLET | Freq: Four times a day (QID) | ORAL | 0 refills | Status: AC | PRN

## 2024-04-12 MED ORDER — LIDOCAINE-PRILOCAINE 2.5-2.5 % EX CREA
1.0000 | TOPICAL_CREAM | CUTANEOUS | 0 refills | Status: AC | PRN
Start: 2024-04-12 — End: ?

## 2024-04-12 MED ORDER — FENTANYL 25 MCG/HR TD PT72: 1.0000 | MEDICATED_PATCH | TRANSDERMAL | 0 refills | Status: DC

## 2024-04-17 ENCOUNTER — Encounter: Payer: Self-pay | Admitting: Hematology

## 2024-04-22 ENCOUNTER — Ambulatory Visit (HOSPITAL_COMMUNITY)
Admission: RE | Admit: 2024-04-22 | Discharge: 2024-04-22 | Disposition: A | Discharge status: Home / Self Care | Source: Ambulatory Visit | Attending: Hematology | Admitting: Hematology

## 2024-04-22 VITALS — Wt 137.0 lb

## 2024-04-22 DIAGNOSIS — C3490 Malignant neoplasm of unspecified part of unspecified bronchus or lung: Secondary | ICD-10-CM | POA: Insufficient documentation

## 2024-04-22 DIAGNOSIS — R911 Solitary pulmonary nodule: Secondary | ICD-10-CM | POA: Insufficient documentation

## 2024-04-22 DIAGNOSIS — C801 Malignant (primary) neoplasm, unspecified: Secondary | ICD-10-CM | POA: Diagnosis not present

## 2024-04-22 DIAGNOSIS — C3412 Malignant neoplasm of upper lobe, left bronchus or lung: Secondary | ICD-10-CM | POA: Diagnosis not present

## 2024-04-22 DIAGNOSIS — C159 Malignant neoplasm of esophagus, unspecified: Secondary | ICD-10-CM | POA: Diagnosis not present

## 2024-04-22 LAB — GLUCOSE, CAPILLARY: Glucose-Capillary: 110 mg/dL — ABNORMAL HIGH (ref 70–99)

## 2024-04-22 MED ORDER — FLUDEOXYGLUCOSE F - 18 (FDG) INJECTION
6.8600 | Freq: Once | INTRAVENOUS | Status: AC
Start: 2024-04-22 — End: 2024-04-22
  Administered 2024-04-22: 6.86 via INTRAVENOUS

## 2024-05-06 DIAGNOSIS — B0229 Other postherpetic nervous system involvement: Secondary | ICD-10-CM | POA: Diagnosis not present

## 2024-05-06 DIAGNOSIS — H16211 Exposure keratoconjunctivitis, right eye: Secondary | ICD-10-CM | POA: Diagnosis not present

## 2024-05-06 DIAGNOSIS — H16031 Corneal ulcer with hypopyon, right eye: Secondary | ICD-10-CM | POA: Diagnosis not present

## 2024-05-06 DIAGNOSIS — H1830 Unspecified corneal membrane change: Secondary | ICD-10-CM | POA: Diagnosis not present

## 2024-05-07 ENCOUNTER — Inpatient Hospital Stay: Attending: Hematology | Admitting: Hematology

## 2024-05-07 DIAGNOSIS — Z79624 Long term (current) use of inhibitors of nucleotide synthesis: Secondary | ICD-10-CM | POA: Diagnosis not present

## 2024-05-07 DIAGNOSIS — C155 Malignant neoplasm of lower third of esophagus: Secondary | ICD-10-CM | POA: Diagnosis not present

## 2024-05-07 DIAGNOSIS — Z9221 Personal history of antineoplastic chemotherapy: Secondary | ICD-10-CM | POA: Diagnosis not present

## 2024-05-07 DIAGNOSIS — C7951 Secondary malignant neoplasm of bone: Secondary | ICD-10-CM | POA: Diagnosis not present

## 2024-05-07 DIAGNOSIS — Z923 Personal history of irradiation: Secondary | ICD-10-CM | POA: Diagnosis not present

## 2024-05-07 DIAGNOSIS — Z7901 Long term (current) use of anticoagulants: Secondary | ICD-10-CM | POA: Diagnosis not present

## 2024-05-07 DIAGNOSIS — Z86711 Personal history of pulmonary embolism: Secondary | ICD-10-CM | POA: Insufficient documentation

## 2024-05-07 DIAGNOSIS — Z86718 Personal history of other venous thrombosis and embolism: Secondary | ICD-10-CM | POA: Insufficient documentation

## 2024-05-07 DIAGNOSIS — Z8501 Personal history of malignant neoplasm of esophagus: Secondary | ICD-10-CM | POA: Insufficient documentation

## 2024-05-07 DIAGNOSIS — C3490 Malignant neoplasm of unspecified part of unspecified bronchus or lung: Secondary | ICD-10-CM | POA: Insufficient documentation

## 2024-05-07 DIAGNOSIS — C3412 Malignant neoplasm of upper lobe, left bronchus or lung: Secondary | ICD-10-CM

## 2024-05-07 DIAGNOSIS — C3491 Malignant neoplasm of unspecified part of right bronchus or lung: Secondary | ICD-10-CM | POA: Diagnosis not present

## 2024-05-07 DIAGNOSIS — Z79899 Other long term (current) drug therapy: Secondary | ICD-10-CM | POA: Insufficient documentation

## 2024-05-07 NOTE — Progress Notes (Signed)
 HEMATOLOGY ONCOLOGY PHONE VISIT NOTE  Date of service: 05/07/24   Patient Care Team: Laneta Pintos, MD as PCP - General (Family Medicine) Frankie Israel, MD as Consulting Physician (Hematology and Oncology)  CHIEF COMPLAINTS/PURPOSE OF CONSULTATION:  Follow-up for continued evaluation and management of metastatic lung cancer Follow-up for esophageal cancer  DIAGNOSIS:   #1 Metastatic non-small cell lung cancer with bilateral lung nodules and large metastatic lesion in the left Ilium. 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 Also had c diff colitis - treated   Current Treatment  1) Active surveillance 2) Xgeva  120mg  [place] q12weeks for bone metastases. 3) Sandostatin  q4weeks for diarrhea   Previous Treatment  For metastatic lung cancer 1 Palliative radiation therapy to the large left ilium metastases 2. IV Nivolumab  x 20 cycles (discontinued due to likely immune colitis) 3. Xgeva  120mg  [place] q4weeks for bone metastases.  For Esophageal adenocarcinoma S/p Concurrent carbo/taxol  + RT  HISTORY OF PRESENTING ILLNESS: (plz see my previous consultation for details of initial presentation)  INTERVAL HISTORY:   Cindy Byrd is a 83 y.o. female who is being connected with via telemedicine visit for continued evaluation and management of metastatic lung cancer and follow-up for esophageal cancer.   Patient was last seen by me on 04/10/2024 and reported SOB sometimes when walking around, worsened postherpetic neuralgia, loss of 4 pounds, and diarrhea only with certain foods.  I connected with Arelia Been on 05/07/24 at  3:30 PM EDT by telephone visit and verified that I am speaking with the correct person using two identifiers.   I discussed the limitations, risks, security and privacy concerns of performing an evaluation and management service by telemedicine and the availability of in-person appointments. I  also discussed with the patient that there may be a patient responsible charge related to this service. The patient expressed understanding and agreed to proceed.   Other persons participating in the visit and their role in the encounter: none   Patient's location: home  Provider's location: [facility]   Chief Complaint: metastatic lung cancer and esophageal cancer    The results of her PET scan from 04/22/2024 was discussed with her in detail.  MEDICAL HISTORY:  Past Medical History:  Diagnosis Date   Barrett's esophagus    Bilateral pulmonary embolism (HCC) 09/02/2016   09/02/16 bilateral pulmonary emboli in context of extensive bilateral lower extremity deep venous thromboses Assumed hypercoagulability due to non-small cell metastatic lung cancer Lifelong anticoagulation recommended   Bone neoplasm 06/24/2015   Cancer (HCC)    metastatic poorly differentiated carcinoma. tumor left groin surgical removal with radiation tx.   Cataract    BILATERAL   Cigarette smoker two packs a day or less    Currently still smoking 2 PPD - Not interested in quitting at this time.   Colitis 2017   Colon polyps    hyperplastic, tubular adenomas, tubulovillous adenoma   Cough, persistent    hx. lung cancer ? primary-being evaluated, unsure of primary site.   Depression 06/24/2015   Diverticulosis    DVT of lower extremity, bilateral (HCC) 09/02/2016   Dysrhythmia    PAF in setting of submassive PE 09/2016   Emphysema of lung (HCC)    Endometriosis    Hysterectomy with BSO at age 46 yrs   Esophageal adenocarcinoma (HCC) 08/11/2015   intramucosal   Gastritis    GERD (gastroesophageal reflux disease)    H/O: pneumonia  Hiatal hernia    Hyperglycemia    A1c 7.4% 06/20/22   Hyperlipidemia    Hypertension 06/24/2015   likely improved incidental to 40 lbs weight loss from her neoplasm. No Longer taking med for this as of 08-06-15   IBS (irritable bowel syndrome)    Lung cancer (HCC)    suspected IV  lung cancer with left ileum mets 2017, s/p left ileum radiaiton, immunotherapy   Pain    left hip-persistent"tumor of bone"-radiation tx. 10.   Pneumonia    Vitamin D  deficiency disease    SURGICAL HISTORY: Past Surgical History:  Procedure Laterality Date   ABDOMINAL HYSTERECTOMY     BALLOON DILATION N/A 10/08/2019   Procedure: BALLOON DILATION;  Surgeon: Nannette Babe, MD;  Location: [facility];  Service: Gastroenterology;  Laterality: N/A;   BARTHOLIN GLAND CYST EXCISION  83 yo ago   Does not want if it was an infected cyst or tumor. Was soon as delivery   BIOPSY  01/02/2019   Procedure: BIOPSY;  Surgeon: Nannette Babe, MD;  Location: [facility];  Service: Gastroenterology;;   CATARACT EXTRACTION     COLONOSCOPY W/ POLYPECTOMY     multiple times - last done 09/2014 per patient.   ESOPHAGOGASTRODUODENOSCOPY (EGD) WITH PROPOFOL  N/A 08/11/2015   Procedure: ESOPHAGOGASTRODUODENOSCOPY (EGD) WITH PROPOFOL ;  Surgeon: Nannette Babe, MD;  Location: [facility];  Service: Gastroenterology;  Laterality: N/A;   ESOPHAGOGASTRODUODENOSCOPY (EGD) WITH PROPOFOL  N/A 01/02/2019   Procedure: ESOPHAGOGASTRODUODENOSCOPY (EGD) WITH PROPOFOL ;  Surgeon: Nannette Babe, MD;  Location: [facility];  Service: Gastroenterology;  Laterality: N/A;   ESOPHAGOGASTRODUODENOSCOPY (EGD) WITH PROPOFOL  N/A 10/08/2019   Procedure: ESOPHAGOGASTRODUODENOSCOPY (EGD) WITH PROPOFOL ;  Surgeon: Nannette Babe, MD;  Location: [facility];  Service: Gastroenterology;  Laterality: N/A;   EYE SURGERY Right 2024   Multiple surgeries over the past year.   FLEXIBLE SIGMOIDOSCOPY N/A 06/24/2017   Procedure: FLEXIBLE SIGMOIDOSCOPY;  Surgeon: Danette Duos, MD;  Location: [facility];  Service: Gastroenterology;  Laterality: N/A;   GANGLION CYST EXCISION     KNEE ARTHROSCOPY  age about 45 yrs   TONSILLECTOMY     TOTAL ABDOMINAL HYSTERECTOMY W/ BILATERAL SALPINGOOPHORECTOMY  at age 76 yrs   For endometriosis   VIDEO  BRONCHOSCOPY WITH ENDOBRONCHIAL ULTRASOUND Bilateral 06/06/2023   Procedure: VIDEO BRONCHOSCOPY WITH ENDOBRONCHIAL ULTRASOUND;  Surgeon: Prudy Brownie, DO;  Location: [facility];  Service: Cardiopulmonary;  Laterality: Bilateral;    SOCIAL HISTORY: Social History   Socioeconomic History   Marital status: Widowed    Spouse name: Not on file   Number of children: 2   Years of education: Not on file   Highest education level: Not on file  Occupational History   Not on file  Tobacco Use   Smoking status: Former    Current packs/day: 0.00    Average packs/day: 1 pack/day for 60.0 years (60.0 ttl pk-yrs)    Types: Cigarettes    Start date: 12/05/1954    Quit date: 12/05/2014    Years since quitting: 9.4    Passive exposure: Never   Smokeless tobacco: Never  Vaping Use   Vaping status: Never Used  Substance and Sexual Activity   Alcohol use: No    Alcohol/week: 0.0 standard drinks of alcohol   Drug use: No   Sexual activity: Not Currently  Other Topics Concern   Not on file  Social History Narrative   Not on file   Social Drivers of Corporate investment banker  Strain: Low Risk  (03/28/2023)   Overall Financial Resource Strain (CARDIA)    Difficulty of Paying Living Expenses: Not hard at all  Food Insecurity: No Food Insecurity (02/02/2024)   Hunger Vital Sign    Worried About Running Out of Food in the Last Year: Never true    Ran Out of Food in the Last Year: Never true  Transportation Needs: No Transportation Needs (02/02/2024)   PRAPARE - Administrator, Civil Service (Medical): No    Lack of Transportation (Non-Medical): No  Physical Activity: Inactive (03/28/2023)   Exercise Vital Sign    Days of Exercise per Week: 0 days    Minutes of Exercise per Session: 0 min  Stress: No Stress Concern Present (03/28/2023)   [employer] of Occupational Health - Occupational Stress Questionnaire    Feeling of Stress : Not at all  Social Connections: Socially  Isolated (01/31/2024)   Social Connection and Isolation Panel [NHANES]    Frequency of Communication with Friends and Family: Three times a week    Frequency of Social Gatherings with Friends and Family: Once a week    Attends Religious Services: Never    Database administrator or Organizations: No    Attends Banker Meetings: Never    Marital Status: Widowed  Intimate Partner Violence: Not At Risk (02/02/2024)   Humiliation, Afraid, Rape, and Kick questionnaire    Fear of Current or Ex-Partner: No    Emotionally Abused: No    Physically Abused: No    Sexually Abused: No    FAMILY HISTORY: Family History  Problem Relation Age of Onset   Colon cancer Brother    Colon cancer Brother    Stroke Mother    Colon cancer Father    Emphysema Father        smoked   Breast cancer Daughter 40       ER/PR+ stage II    ALLERGIES:  is allergic to penicillins, remeron  [mirtazapine ], and latex. patient wonders if she has a penicillin allergy but notes that she is uncertain about this.  MEDICATIONS:  Current Outpatient Medications  Medication Sig Dispense Refill   apixaban  (ELIQUIS ) 5 MG TABS tablet Take 1 tablet (5 mg total) by mouth 2 (two) times daily. 180 tablet 1   Budeson-Glycopyrrol-Formoterol  (BREZTRI  AEROSPHERE) 160-9-4.8 MCG/ACT AERO INHALE 2 PUFFS INTO THE LUNGS TWICE A DAY 10.7 each 1   buPROPion  (WELLBUTRIN  XL) 150 MG 24 hr tablet Take 1 tablet (150 mg total) by mouth daily. 90 tablet 1   DULoxetine  (CYMBALTA ) 30 MG capsule Take 3 capsules (90 mg total) by mouth daily. 270 capsule 1   fentaNYL  (DURAGESIC ) 25 MCG/HR Place 1 patch onto the skin every 3 (three) days. 10 patch 0   gabapentin  (NEURONTIN ) 800 MG tablet Take 1 tablet (800 mg total) by mouth 3 (three) times daily. 270 tablet 1   HYDROcodone -acetaminophen  (NORCO) 5-325 MG tablet Take 1 tablet by mouth every 6 (six) hours as needed for moderate pain (pain score 4-6). 30 tablet 0   lidocaine -prilocaine  (EMLA )  cream Apply 1 Application topically as needed. 30 g 0   omeprazole  (PRILOSEC) 40 MG capsule Take 1 capsule (40 mg total) by mouth daily before breakfast. 30 capsule 3   ondansetron  (ZOFRAN -ODT) 4 MG disintegrating tablet Take 1 tablet (4 mg total) by mouth every 8 (eight) hours as needed for nausea or vomiting. 20 tablet 0   traZODone  (DESYREL ) 100 MG tablet Take 100 mg by mouth  at bedtime as needed for sleep.     valACYclovir  (VALTREX ) 500 MG tablet TAKE 1 TABLET BY MOUTH TWICE A DAY 180 tablet 3   No current facility-administered medications for this visit.   REVIEW OF SYSTEMS:  10 Point review of Systems was done is negative except as noted above.   PHYSICAL EXAMINATION: TELEMEDICINE VISIT    LABORATORY DATA:  I have reviewed the data as listed  .    Latest Ref Rng & Units 04/10/2024    1:26 PM 02/26/2024    9:16 AM 01/31/2024    4:33 AM  CBC  WBC 4.0 - 10.5 K/uL 8.4  12.5  12.0   Hemoglobin 12.0 - 15.0 g/dL 16.1  09.6  04.5   Hematocrit 36.0 - 46.0 % 41.6  40.0  40.8   Platelets 150 - 400 K/uL 264  334  257    ANC 1.8k .    Latest Ref Rng & Units 04/10/2024    1:26 PM 02/26/2024    9:16 AM 01/31/2024    4:33 AM  CMP  Glucose 70 - 99 mg/dL 91  91  96   BUN 8 - 23 mg/dL 25  30  20    Creatinine 0.44 - 1.00 mg/dL 4.09  8.11  9.14   Sodium 135 - 145 mmol/L 139  137  137   Potassium 3.5 - 5.1 mmol/L 3.9  4.0  4.6   Chloride 98 - 111 mmol/L 106  105  103   CO2 22 - 32 mmol/L 25  26  26    Calcium  8.9 - 10.3 mg/dL 9.6  9.7  7.9   Total Protein 6.5 - 8.1 g/dL 7.9  7.7    Total Bilirubin 0.0 - 1.2 mg/dL 0.2  0.3    Alkaline Phos 38 - 126 U/L 68  46    AST 15 - 41 U/L 42  23    ALT 0 - 44 U/L 30  15          01/02/19 Esophagus Biopsy:    RADIOGRAPHIC STUDIES: .NM PET Image Restag (PS) Skull Base To Thigh Result Date: 04/29/2024 CLINICAL DATA:  Subsequent treatment strategy for non-small-cell lung cancer. History of esophageal cancer. EXAM: NUCLEAR MEDICINE PET SKULL BASE  TO THIGH TECHNIQUE: 6.86 mCi F-18 FDG was injected intravenously. Full-ring PET imaging was performed from the skull base to thigh after the radiotracer. CT data was obtained and used for attenuation correction and anatomic localization. Fasting blood glucose: 110 mg/dl COMPARISON:  PET-CT 78/29/5621. CT exam spine, CTA chest and abdomen pelvis from 01/30/2024. FINDINGS: Mediastinal blood pool activity: SUV max 2.1 Liver activity: SUV max 2.7 NECK: Scattered physiologic paraspinal muscle uptake. No specific abnormal uptake along lymph node change the neck including submandibular, posterior triangle or internal jugular region. Near symmetric uptake of the visualized intracranial compartment. Incidental CT findings: The parotid glands, submandibular glands are unremarkable. Small thyroid  gland. Some streak artifact related to the patient's dental hardware. There is some mucosal thickening along the maxillary sinuses, left greater than right there is also some opacity along the left anterior ethmoid air cells visualized mastoid air cells are clear. Bilateral middle turbinate concha bullosa. CHEST: Physiologic right sided chest wall musculature uptake adjacent to the scapula. There is no specific abnormal uptake above blood pool in the axillary regions, hilum or mediastinum. However there is significant abnormal uptake within the left upper lobe perihilar lung nodule with maximum SUV value 12.4. Nodule on image 63 measures 14 by 11 mm.  This was not present on the prior PET-CT. On the recent CT angiogram of 01/30/2024 lesion is similar in size today's examination. This is worrisome for a malignant lesion. No additional abnormal uptake along the lung parenchyma. Incidental CT findings: There is some linear opacity lung bases likely scar or atelectasis. Emphysematous lung changes are identified. Diffuse vascular calcifications are seen. Coronary calcifications are identified. No pericardial effusion. Slightly patulous  thoracic esophagus. Right IJ chest port with tip extending into the central SVC. ABDOMEN/PELVIS: There is physiologic distribution radiotracer along the parenchymal organs, bowel and renal collecting systems. There is some asymmetric uptake along right paraspinal musculature, physiologic. Incidental CT findings: Motion. Lobular small spleen. Global atrophy of the pancreas. No renal or ureteral stones identified. Dependent gallstones. Small hepatic cystic foci are stable. Extensive vascular calcifications. Large bowel has a normal course and caliber. Left-sided colonic diverticula. Normal appendix. Stomach and small bowel are nondilated. No free air or free fluid. SKELETON: No abnormal uptake along the visualized osseous structures. Incidental CT findings: Mixed lucent and sclerotic area along the left iliac bone is stable from previous. Area of potentially healed fracture. Overall appearance unchanged in there is no significant abnormal uptake. Curvature and degenerative changes along the spine. IMPRESSION: Left perihilar new lung nodule is hypermetabolic and worrisome for malignant focus. This could be metastatic or separate primary. No additional areas of abnormal uptake at this time suggest additional areas of disease. Stable deformity with mixed lucent sclerotic left iliac bone lesion with partially noted fracture. No abnormal uptake. Please correlate with history of previous pulmonary emboli not well evaluated on this examination. Electronically Signed   By: Adrianna Horde M.D.   On: 04/29/2024 17:42    ASSESSMENT & PLAN:   83 y.o. female with  #1 Metastatic poorly differentiated carcinoma with likely lung primary non-small cell lung cancer.    CT of the head with and without contrast showed no evidence of metastatic disease. EGFR blood test mutation analysis negative. CT chest abdomen pelvis 04/19/2016 shows no evidence of disease progression. Patient tolerated Nivolumab  very well but was discontinued  when she developed grade 2 Immune colitis. Has been off Nivolumab  for >6 months  CT chest abdomen pelvis on 06/24/2016 shows no evidence of new disease or progression of metastatic disease. CT chest abdomen pelvis 09/06/2016 shows 1. Mixed interval response to therapy. 2. There is a new left ventral chest wall lesion deep to the pectoralis musculature worrisome for metastatic disease. 3. Posterior lower lobe nodular densities are identified which may reflect areas of pulmonary metastasis. 4. Interval decrease in size of destructive lesion involving the left iliac bone.  CT chest abd pelvis 12/08/2016: Cystic mass involving the left ventral chest wall has resolved in the interval. Likely was a hematoma due to trauma. Interval increase in size of pleural base mass overlying the posterior and inferior left lower lobe. There is also a new left pleural effusion identified.  CT chest 02/01/2017: Residual irregular soft tissue thickening/volume loss and trace left pleural fluid at the base of the left hemithorax, overall improved in appearance from 12/08/2016. No measurable lesion.  CT chest 05/29/2017 shows no residual pleural based mass or significant pleural effusion in the left hemithorax. No evidence of thoracic metastatic disease. No evidence of progressive metastatic disease within the abdomen or pelvis. Mixed lytic and blastic lesion involving the left iliac bone and associated pathologic fracture are unchanged.   CT CAP 09/14/17 shows no new changes. She does have slight displacement of her fractured  left iliac bone. Evidence of stable disease.   CT CAP 01/04/2018- No new or progressive metastatic disease. Stable large left iliac bone metastasis with associated chronic pathologic fracture.   CT chest/abd/pelvis done on 04/26/18 revealed Stable exam.  No new or progressive interval findings.  07/19/18 CT C/A/P revealed Stable exam.  No new or progressive interval findings. Large destructive left iliac  lesion is similar to prior. Aortic Atherosclerosis and Emphysema.    11/06/18 CT C/A/P revealed Similar appearance of large mixed lytic and sclerotic lesion in the left ilium. No new metastatic lesions are otherwise noted elsewhere in the chest, abdomen or pelvis. 2. Interval development of thickening of the distal third of the esophagus. This is nonspecific, and could be related to underlying reflux esophagitis. However, if there is any clinical concern for Barrett's metaplasia or esophageal neoplasia, further evaluation with nonemergent endoscopy could be considered. 3. Aortic atherosclerosis, in addition to left main coronary artery disease. Assessment for potential risk factor modification, dietary therapy or pharmacologic therapy may be warranted, if clinically Indicated. 4. Diffuse bronchial wall thickening with mild to moderate centrilobular and paraseptal emphysema; imaging findings suggestive of underlying COPD. 5. Additional incidental findings, as above.  #2  Adenocarcinoma of the Esophagus  Barrett's esophagus 4cms in the distal esophagus with low and high-grade dysplasia  01/02/19 Surgical pathology revealed adenocarcinoma of the esophagus   01/25/19 PET/CT revealed Distal esophageal primary, without hypermetabolic metastatic disease. 2. Chronic left iliac metastasis, as before. 3. Hypermetabolism within and superficial to the right gluteal musculature is most likely related to trauma and/or injection sites. 4. Aortic atherosclerosis, coronary artery atherosclerosis and emphysema.  S/p concurrent Carboplatin  and Taxol  weekly with RT of 45 Gy in 25 fractions and 5.4 Gy boost, completed between 02/04/19 and 03/27/19  07/03/2019 PET skull base to thigh revealed "1. Interval response to therapy. Significant reduction in FDG uptake associated with distal esophageal mass. SUV max currently 2.61 versus 16.97 previously. 2. 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 Unchanged 3. Aortic  Atherosclerosis (ICD10-I70.0) and Emphysema (ICD10-J43.9). Coronary artery calcifications."  07/15/2020 CT C/A/P ([id number]) ([id number]) revealed "1. No evidence of new or progressive metastatic disease in the chest, abdomen or pelvis."  #3 diarrhea-  now resolved was previously. 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 She was previously on on Lialda , budesonide ,probiotics and lomotil  but not currently taking any of these. Plan -Continue Sandostatin  every 4 weeks   #4 h/o DVT and PE  -continue on Xarelto  - no issues with bleeding   #5 history of COPD management per primary care physician  #6 hx of severe shingles of the forehead with postherpetic neuralgia  PLAN:  -Patient noted to have been hospitalized in February for acute pulmonary embolism and was restarted on blood thinners. At that time, she was found to have a small nodule in the left upper lung on CT scan, which has been further evaluated with PET scan -04/22/2024 PET scan shows findings of a new active nodule in the left upper lung, which is 14 mm x 11 mm in size and is concerning for another primary in the lung. There are no findings of lymph nodes in the bones.  -discussed option of connecting patient with a lung doctor to biopsy the lung nodule, though this can be difficult since the nodule is small -discussed second option of connecting patient with a radiation oncologist  to consider very localized SBRT to the lung nodule -patient is agreeable to referral to radiation oncologist -advised patient to let us  know if she does not hear from radiation oncology office in the next week -continue Eliquis  5 MG -continue Xgeva  every 12 weeks -CT chest scan w/o contrast in 10 weeks -patient shall return to clinic in 12 weeks  FOLLOW-UP: Referral to radiation oncology for SBRT to new LUL new lung primary CT chest  wo contrast in 10 weeks RTC with Dr Salomon Cree in 12 weeks continue Xgeva  every 12 weeks  .The total time spent in the appointment was 30 minutes* .  All of the patient's questions were answered with apparent satisfaction. The patient knows to call the clinic with any problems, questions or concerns.   Cindy Matt MD MS AAHIVMS [facility] Hematology/Oncology Physician [facility]  .*Total Encounter Time as defined by the Centers for Medicare and Medicaid Services includes, in addition to the face-to-face time of a patient visit (documented in the note above) non-face-to-face time: obtaining and reviewing outside history, ordering and reviewing medications, tests or procedures, care coordination (communications with other health care professionals or caregivers) and documentation in the medical record.  I,Cindy Byrd,acting as a Neurosurgeon for Cindy Matt, MD.,have documented all relevant documentation on the behalf of Cindy Matt, MD,as directed by  Cindy Matt, MD while in the presence of Cindy Matt, MD.  .I have reviewed the above documentation for accuracy and completeness, and I agree with the above. .Cindy Springer Kishore Letisia Schwalb MD

## 2024-05-09 DIAGNOSIS — R519 Headache, unspecified: Secondary | ICD-10-CM | POA: Diagnosis not present

## 2024-05-09 DIAGNOSIS — B0222 Postherpetic trigeminal neuralgia: Secondary | ICD-10-CM | POA: Diagnosis not present

## 2024-05-13 ENCOUNTER — Encounter: Payer: Self-pay | Admitting: Hematology

## 2024-05-14 ENCOUNTER — Encounter: Payer: Self-pay | Admitting: Radiation Oncology

## 2024-05-14 NOTE — Progress Notes (Signed)
 Thoracic Location of Tumor / Histology: New LUL nodule- Diagnosis: Metastatic non-small cell lung cancer with bilateral lung nodules and large metastatic lesion in the left Ilium.   Patient presented late 2014 with symptoms of: left hip and left groin pain   Past/Anticipated interventions by cardiothoracic surgery, if any: Dr. Thelda Finney: Video Bronchoscopy with Endobronchial Ultrasound Procedure Note   Date of Operation: 06/06/2023 Pre-op Diagnosis: Adenopathy  Post-op Diagnosis: Adenopathy  Surgeon: Prudy Brownie DO  Assistants: None  Anesthesia: General endotracheal anesthesia Operation: Flexible video fiberoptic bronchoscopy with endobronchial ultrasound and biopsies. Estimated Blood Loss: Minimal Complications: None   Pathology:      Past/Anticipated interventions by medical oncology, if any: Frankie Israel, MD     Tobacco/Marijuana/Snuff/ETOH use: None  Signs/Symptoms Weight changes, if any: Stable Respiratory complaints, if any: SOB w/ exertion. Hemoptysis, if any: Denies Pain issues, if any:  Yes 8/10 Bilateral lower back  SAFETY ISSUES: Prior radiation? Yes- LT ilium-Palliative, LT iliac bone 07/15/15-07/28/15, lower third esophagus 02/04/2019-03/22/2019 Pacemaker/ICD? NO  Possible current pregnancy? Hysterectomy Is the patient on methotrexate? NO  Current Complaints / other details:  None   Vitals Limited via phone: Ht 5\' 5"  (1.651 m)   Wt 135 lb (61.2 kg)   BMI 22.47 kg/m    This concludes the interaction.  Avery Bodo, LPN

## 2024-05-15 ENCOUNTER — Ambulatory Visit
Admission: RE | Admit: 2024-05-15 | Discharge: 2024-05-15 | Disposition: A | Discharge status: Home / Self Care | Source: Ambulatory Visit | Attending: Radiation Oncology | Admitting: Radiation Oncology

## 2024-05-15 ENCOUNTER — Encounter: Payer: Self-pay | Admitting: Radiation Oncology

## 2024-05-15 VITALS — Ht 65.0 in | Wt 135.0 lb

## 2024-05-15 DIAGNOSIS — C3412 Malignant neoplasm of upper lobe, left bronchus or lung: Secondary | ICD-10-CM | POA: Insufficient documentation

## 2024-05-15 DIAGNOSIS — C3482 Malignant neoplasm of overlapping sites of left bronchus and lung: Secondary | ICD-10-CM

## 2024-05-15 DIAGNOSIS — C3490 Malignant neoplasm of unspecified part of unspecified bronchus or lung: Secondary | ICD-10-CM

## 2024-05-15 DIAGNOSIS — C155 Malignant neoplasm of lower third of esophagus: Secondary | ICD-10-CM | POA: Diagnosis not present

## 2024-05-15 DIAGNOSIS — C7802 Secondary malignant neoplasm of left lung: Secondary | ICD-10-CM | POA: Diagnosis not present

## 2024-05-15 NOTE — Progress Notes (Signed)
 Radiation Oncology         (336) [phone] ________________________________  Outpatient Re Consultation - Conducted via telephone at patient request.  I spoke with the patient to conduct this consult visit via telephone. The patient was notified in advance and was offered an in person or telemedicine meeting to allow for face to face communication but instead preferred to proceed with a telephone consult.    Name: Cindy Byrd        MRN: [id number]  Date of Service: 05/15/2024 DOB: 02/07/1941  WU:[id number], Linford Ribas, MD  Frankie Israel, MD     REFERRING PHYSICIAN: Frankie Israel, MD   DIAGNOSIS: The primary encounter diagnosis was Malignant neoplasm of overlapping sites of left lung [facility]). A diagnosis of Primary malignant neoplasm of lung metastatic to other site, unspecified laterality [facility]) was also pertinent to this visit.   HISTORY OF PRESENT ILLNESS: MASIEL Byrd is a 83 y.o. female seen at the request of Dr. Salomon Cree for a new LUL disease near the hilum. The patient has a history of stage IV non-small cell carcinoma of the lung that was diagnosed in 2016 after undergoing a left iliac biopsy.  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  She was followed in surveillance and in 2019 by CT there was new interval development of thickening of the distal third of the esophagus, and changes consistent with underlying COPD of the lungs.  Given the finding of the esophageal abnormality, she was sent to be evaluated by gastroenterology.  Of note she also has a history of Barrett's esophagus and a dysplastic nodule that had been identified in 2017 by GI providers at [facility].  She was sent to be seen by Dr. Bridgett Camps, and EGD on 01/02/2018 revealed a mass in the distal esophagus and half of the luminal surface was involved. A biopsy of this site revealed an adenocarcinoma.  She received  chemoradiation for this which she completed in April of 2020.  Please see prior note 05/09/23 from Dr. Jeryl Moris, and Op note on 06/06/23 from Dr. Thelda Finney. Pt has a histroy of stage IV NSCLC originally diagnosed in 2016 who received palliative immunotherapy which has kept her in remission, but who also had adenocarcinoma of the esophagus diagnosed in 2019, treated with chemoradiation.   She recently developed a soft tissue density along the left hilum favored to be adenopathy by CT. She met with Dr. Thelda Finney and underwent bronchscopy today that did not specifically identify nodes to sample, rather thickening of the pulmonary vasculature, the procedure was aborted. Dr. Thelda Finney recommends PET imaging for further clarity on the findings by CT. We intend to present her in multidisciplinary thoracic oncology conference once this has been performed to determine next steps in her care with her oncologist, pulmonologist, and radiation oncologist.   The patient was being followed in surveillance and imaging in May 2024 by CT showed prominent soft tissue density in the left hilum narrowing the left lower lobe pulmonary artery measuring 2.2 cm and there was fluid attenuation with concern for endobronchial nodule in the proximal left mainstem bronchus measuring 1.1 cm.  Chronic residual lobe pulmonary embolism in the right upper lobe pulmonary artery and small branch vessels were noted and she had been seen in consultation to consider radiotherapy to that site.  Her case had been discussed with Dr. Thelda Finney and she did undergo a bronchoscopy on 06/06/23  but there were no nodes to sample and rather thickening of the pulmonary vasculature so the procedure was aborted.  She proceeded with a PET  for further clarity which was performed on 06/15/2023 and showed no hypermetabolic mediastinal hilar nodes so it was recommended that she follow in surveillance.  This has been the case since that time, and in February 2025 a CT with angiography of the  chest showed acute right lower lobe segmental pulmonary emboli with minimal clot burden and no evidence of right heart strain and stable sequela of chronic pulmonary thromboembolic disease with thin synechia in the right upper lobe and chronic adherent nonocclusive thrombus within the left upper lobe and left lower lobe pulmonary artery origins new however was a 9 mm left upper lobe nodule.  This was followed by a PET scan on 04/22/2024 and showed new perihilar left upper lobe nodule separate from what had previously been the site of concern in the hilum more laterally and this measured 14 x 11 mm with an SUV max of 12.4.  No additional areas of uptake were appreciated and she is contacted by phone to consider stereotactic body radiotherapy to this site.   PREVIOUS RADIATION THERAPY:   02/04/2019 - 03/22/2019 The esophagus received 50 Gy in 25 fractions initially. A boost was then given to 6 Gy to yield 56 Gy to the high dose target, utilizing a sequential boost technique.  07/15/2015-07/28/2015 The left iliac bone metastasis was treated to 30 Gy in 10 fractions of 3 Gy, treated by Dr. Lorri Rota   PAST MEDICAL HISTORY:  Past Medical History:  Diagnosis Date   Barrett's esophagus    Bilateral pulmonary embolism (HCC) 09/02/2016   09/02/16 bilateral pulmonary emboli in context of extensive bilateral lower extremity deep venous thromboses Assumed hypercoagulability due to non-small cell metastatic lung cancer Lifelong anticoagulation recommended   Bone neoplasm 06/24/2015   Cancer (HCC)    metastatic poorly differentiated carcinoma. tumor left groin surgical removal with radiation tx.   Cataract    BILATERAL   Cigarette smoker two packs a day or less    Currently still smoking 2 PPD - Not interested in quitting at this time.   Colitis 2017   Colon polyps    hyperplastic, tubular adenomas, tubulovillous adenoma   Cough, persistent    hx. lung cancer ? primary-being evaluated, unsure of primary  site.   Depression 06/24/2015   Diverticulosis    DVT of lower extremity, bilateral (HCC) 09/02/2016   Dysrhythmia    PAF in setting of submassive PE 09/2016   Emphysema of lung (HCC)    Endometriosis    Hysterectomy with BSO at age 26 yrs   Esophageal adenocarcinoma (HCC) 08/11/2015   intramucosal   Gastritis    GERD (gastroesophageal reflux disease)    H/O: pneumonia    Hiatal hernia    Hyperglycemia    A1c 7.4% 06/20/22   Hyperlipidemia    Hypertension 06/24/2015   likely improved incidental to 40 lbs weight loss from her neoplasm. No Longer taking med for this as of 08-06-15   IBS (irritable bowel syndrome)    Lung cancer (HCC)    suspected IV lung cancer with left ileum mets 2017, s/p left ileum radiaiton, immunotherapy   Pain    left hip-persistenttumor of bone-radiation tx. 10.   Pneumonia    Vitamin D  deficiency disease        PAST SURGICAL HISTORY: Past Surgical History:  Procedure Laterality Date   ABDOMINAL HYSTERECTOMY  BALLOON DILATION N/A 10/08/2019   Procedure: BALLOON DILATION;  Surgeon: Nannette Babe, MD;  Location: [facility];  Service: Gastroenterology;  Laterality: N/A;   BARTHOLIN GLAND CYST EXCISION  83 yo ago   Does not want if it was an infected cyst or tumor. Was soon as delivery   BIOPSY  01/02/2019   Procedure: BIOPSY;  Surgeon: Nannette Babe, MD;  Location: [facility];  Service: Gastroenterology;;   CATARACT EXTRACTION     COLONOSCOPY W/ POLYPECTOMY     multiple times - last done 09/2014 per patient.   ESOPHAGOGASTRODUODENOSCOPY (EGD) WITH PROPOFOL  N/A 08/11/2015   Procedure: ESOPHAGOGASTRODUODENOSCOPY (EGD) WITH PROPOFOL ;  Surgeon: Nannette Babe, MD;  Location: [facility];  Service: Gastroenterology;  Laterality: N/A;   ESOPHAGOGASTRODUODENOSCOPY (EGD) WITH PROPOFOL  N/A 01/02/2019   Procedure: ESOPHAGOGASTRODUODENOSCOPY (EGD) WITH PROPOFOL ;  Surgeon: Nannette Babe, MD;  Location: [facility];  Service: Gastroenterology;  Laterality:  N/A;   ESOPHAGOGASTRODUODENOSCOPY (EGD) WITH PROPOFOL  N/A 10/08/2019   Procedure: ESOPHAGOGASTRODUODENOSCOPY (EGD) WITH PROPOFOL ;  Surgeon: Nannette Babe, MD;  Location: [facility];  Service: Gastroenterology;  Laterality: N/A;   EYE SURGERY Right 2024   Multiple surgeries over the past year.   FLEXIBLE SIGMOIDOSCOPY N/A 06/24/2017   Procedure: FLEXIBLE SIGMOIDOSCOPY;  Surgeon: Danette Duos, MD;  Location: [facility];  Service: Gastroenterology;  Laterality: N/A;   GANGLION CYST EXCISION     KNEE ARTHROSCOPY  age about 89 yrs   TONSILLECTOMY     TOTAL ABDOMINAL HYSTERECTOMY W/ BILATERAL SALPINGOOPHORECTOMY  at age 68 yrs   For endometriosis   VIDEO BRONCHOSCOPY WITH ENDOBRONCHIAL ULTRASOUND Bilateral 06/06/2023   Procedure: VIDEO BRONCHOSCOPY WITH ENDOBRONCHIAL ULTRASOUND;  Surgeon: Prudy Brownie, DO;  Location: [facility];  Service: Cardiopulmonary;  Laterality: Bilateral;     FAMILY HISTORY:  Family History  Problem Relation Age of Onset   Colon cancer Brother    Colon cancer Brother    Stroke Mother    Colon cancer Father    Emphysema Father        smoked   Breast cancer Daughter 63       ER/PR+ stage II     SOCIAL HISTORY:  reports that she quit smoking about 9 years ago. Her smoking use included cigarettes. She started smoking about 69 years ago. She has a 60 pack-year smoking history. She has never been exposed to tobacco smoke. She has never used smokeless tobacco. She reports that she does not drink alcohol and does not use drugs.  The patient is widowed.  She lives in [place], [place].    ALLERGIES: Penicillins, Remeron  [mirtazapine ], and Latex   MEDICATIONS:  Current Outpatient Medications  Medication Sig Dispense Refill   apixaban  (ELIQUIS ) 5 MG TABS tablet Take 1 tablet (5 mg total) by mouth 2 (two) times daily. 180 tablet 1   Budeson-Glycopyrrol-Formoterol  (BREZTRI  AEROSPHERE) 160-9-4.8 MCG/ACT AERO INHALE 2 PUFFS INTO THE LUNGS TWICE A DAY 10.7 each  1   buPROPion  (WELLBUTRIN  XL) 150 MG 24 hr tablet Take 1 tablet (150 mg total) by mouth daily. (Patient not taking: Reported on 05/14/2024) 90 tablet 1   DULoxetine  (CYMBALTA ) 30 MG capsule Take 3 capsules (90 mg total) by mouth daily. 270 capsule 1   fentaNYL  (DURAGESIC ) 25 MCG/HR Place 1 patch onto the skin every 3 (three) days. 10 patch 0   gabapentin  (NEURONTIN ) 800 MG tablet Take 1 tablet (800 mg total) by mouth 3 (three) times daily. 270 tablet 1   HYDROcodone -acetaminophen  (NORCO) 5-325 MG  tablet Take 1 tablet by mouth every 6 (six) hours as needed for moderate pain (pain score 4-6). 30 tablet 0   lidocaine -prilocaine  (EMLA ) cream Apply 1 Application topically as needed. 30 g 0   omeprazole  (PRILOSEC) 40 MG capsule Take 1 capsule (40 mg total) by mouth daily before breakfast. 30 capsule 3   ondansetron  (ZOFRAN -ODT) 4 MG disintegrating tablet Take 1 tablet (4 mg total) by mouth every 8 (eight) hours as needed for nausea or vomiting. 20 tablet 0   traZODone  (DESYREL ) 100 MG tablet Take 100 mg by mouth at bedtime as needed for sleep.     valACYclovir  (VALTREX ) 500 MG tablet TAKE 1 TABLET BY MOUTH TWICE A DAY 180 tablet 3   No current facility-administered medications for this encounter.     REVIEW OF SYSTEMS: On review of systems, the patient reports that she is doing pretty well. She has stable weight, no cough or hemoptysis, and gets short of breath with exertion. She does have ongoing low back pain. No other complaints are verbalized.      PHYSICAL EXAM:  Unable to assess due to encounter type    ECOG = 1  0 - Asymptomatic (Fully active, able to carry on all predisease activities without restriction)  1 - Symptomatic but completely ambulatory (Restricted in physically strenuous activity but ambulatory and able to carry out work of a light or sedentary nature. For example, light housework, office work)  2 - Symptomatic, <50% in bed during the day (Ambulatory and capable of all  self care but unable to carry out any work activities. Up and about more than 50% of waking hours)  3 - Symptomatic, >50% in bed, but not bedbound (Capable of only limited self-care, confined to bed or chair 50% or more of waking hours)  4 - Bedbound (Completely disabled. Cannot carry on any self-care. Totally confined to bed or chair)  5 - Death   Aurea Blossom MM, Creech RH, Tormey DC, et al. [phone]). Toxicity and response criteria of the [facility]. Am. Hillard Lowes. Oncol. 5 (6): 649-55    LABORATORY DATA:  Lab Results  Component Value Date   WBC 8.4 04/10/2024   HGB 13.7 04/10/2024   HCT 41.6 04/10/2024   MCV 95.2 04/10/2024   PLT 264 04/10/2024   Lab Results  Component Value Date   NA 139 04/10/2024   K 3.9 04/10/2024   CL 106 04/10/2024   CO2 25 04/10/2024   Lab Results  Component Value Date   ALT 30 04/10/2024   AST 42 (H) 04/10/2024   ALKPHOS 68 04/10/2024   BILITOT 0.2 04/10/2024      RADIOGRAPHY: NM PET Image Restag (PS) Skull Base To Thigh Result Date: 04/29/2024 CLINICAL DATA:  Subsequent treatment strategy for non-small-cell lung cancer. History of esophageal cancer. EXAM: NUCLEAR MEDICINE PET SKULL BASE TO THIGH TECHNIQUE: 6.86 mCi F-18 FDG was injected intravenously. Full-ring PET imaging was performed from the skull base to thigh after the radiotracer. CT data was obtained and used for attenuation correction and anatomic localization. Fasting blood glucose: 110 mg/dl COMPARISON:  PET-CT 46/96/2952. CT exam spine, CTA chest and abdomen pelvis from 01/30/2024. FINDINGS: Mediastinal blood pool activity: SUV max 2.1 Liver activity: SUV max 2.7 NECK: Scattered physiologic paraspinal muscle uptake. No specific abnormal uptake along lymph node change the neck including submandibular, posterior triangle or internal jugular region. Near symmetric uptake of the visualized intracranial compartment. Incidental CT findings: The parotid glands, submandibular glands  are unremarkable.  Small thyroid  gland. Some streak artifact related to the patient's dental hardware. There is some mucosal thickening along the maxillary sinuses, left greater than right there is also some opacity along the left anterior ethmoid air cells visualized mastoid air cells are clear. Bilateral middle turbinate concha bullosa. CHEST: Physiologic right sided chest wall musculature uptake adjacent to the scapula. There is no specific abnormal uptake above blood pool in the axillary regions, hilum or mediastinum. However there is significant abnormal uptake within the left upper lobe perihilar lung nodule with maximum SUV value 12.4. Nodule on image 63 measures 14 by 11 mm. This was not present on the prior PET-CT. On the recent CT angiogram of 01/30/2024 lesion is similar in size today's examination. This is worrisome for a malignant lesion. No additional abnormal uptake along the lung parenchyma. Incidental CT findings: There is some linear opacity lung bases likely scar or atelectasis. Emphysematous lung changes are identified. Diffuse vascular calcifications are seen. Coronary calcifications are identified. No pericardial effusion. Slightly patulous thoracic esophagus. Right IJ chest port with tip extending into the central SVC. ABDOMEN/PELVIS: There is physiologic distribution radiotracer along the parenchymal organs, bowel and renal collecting systems. There is some asymmetric uptake along right paraspinal musculature, physiologic. Incidental CT findings: Motion. Lobular small spleen. Global atrophy of the pancreas. No renal or ureteral stones identified. Dependent gallstones. Small hepatic cystic foci are stable. Extensive vascular calcifications. Large bowel has a normal course and caliber. Left-sided colonic diverticula. Normal appendix. Stomach and small bowel are nondilated. No free air or free fluid. SKELETON: No abnormal uptake along the visualized osseous structures. Incidental CT findings:  Mixed lucent and sclerotic area along the left iliac bone is stable from previous. Area of potentially healed fracture. Overall appearance unchanged in there is no significant abnormal uptake. Curvature and degenerative changes along the spine. IMPRESSION: Left perihilar new lung nodule is hypermetabolic and worrisome for malignant focus. This could be metastatic or separate primary. No additional areas of abnormal uptake at this time suggest additional areas of disease. Stable deformity with mixed lucent sclerotic left iliac bone lesion with partially noted fracture. No abnormal uptake. Please correlate with history of previous pulmonary emboli not well evaluated on this examination. Electronically Signed   By: Adrianna Horde M.D.   On: 04/29/2024 17:42      IMPRESSION/PLAN: 1. History of Stage IV poorly differentiated carcinoma of the lung and history of Adenocarcinoma of the distal esophagus with new LUL nodule significant for either recurrence of one of her cancers, or separate primary Stage IA2, cT1bN0M0, NSCLC. Dr. Jeryl Moris has reviewed her prior imaging and prior courses of treatment. Interestingly the LUL nodule near the left hilum we are seeing now, is separate from what ended up being a benign finding in the more central left hilum last year around this time. Considering the new findings by PET, Dr. Jeryl Moris would offer stereotactic body radiotherapy (SBRT) to the lung lesion in the LUL at the level of the hilum. We did discuss the limitations of not having tissue confirmation, so it is possible this either represents a recurrence of the cancers she's had in the past, or a new lung primary. She is also aware that her comorbidities might make another bronchoscopy more risky to proceed with. She is in agreement to proceed definitively with radiation without biopsy and comfortable with ongoing surveillance with Dr. Salomon Cree following SBRT.  We discussed the risks, benefits, short, and long term effects of  radiotherapy, as well as the locally curative  intent, and the patient is interested in proceeding. I reviewed the delivery and logistics of radiotherapy and that Dr. Jeryl Moris recommends 5 fractions of therapy. The patient will be contacted to coordinate treatment planning by our simulation department. She will sign written consent at that time to proceed.    This encounter was conducted via telephone.  The patient has provided two factor identification and has given verbal consent for this type of encounter and has been advised to only accept a meeting of this type in a secure network environment. The time spent during this encounter was 45 minutes including preparation, discussion, and coordination of the patient's care. The attendants for this meeting include  Bettejane Brownie  and ARIYANAH AGUADO and her niece Krystal [place]. During the encounter,   Bettejane Brownie was located at [facility] Radiation Oncology Department.  LANYIA JEWEL was located at home with her niece Krystal [place].         Shelvia Dick, PAC

## 2024-05-16 DIAGNOSIS — C7802 Secondary malignant neoplasm of left lung: Secondary | ICD-10-CM | POA: Diagnosis not present

## 2024-05-16 DIAGNOSIS — C155 Malignant neoplasm of lower third of esophagus: Secondary | ICD-10-CM | POA: Diagnosis not present

## 2024-05-20 ENCOUNTER — Inpatient Hospital Stay: Payer: Medicare Other

## 2024-05-20 ENCOUNTER — Ambulatory Visit
Admission: RE | Admit: 2024-05-20 | Discharge: 2024-05-20 | Disposition: A | Discharge status: Home / Self Care | Source: Ambulatory Visit | Attending: Radiation Oncology | Admitting: Radiation Oncology

## 2024-05-20 ENCOUNTER — Other Ambulatory Visit: Payer: Medicare Other

## 2024-05-20 DIAGNOSIS — C3482 Malignant neoplasm of overlapping sites of left bronchus and lung: Secondary | ICD-10-CM | POA: Insufficient documentation

## 2024-05-20 DIAGNOSIS — Z510 Encounter for antineoplastic radiation therapy: Secondary | ICD-10-CM | POA: Diagnosis not present

## 2024-05-20 DIAGNOSIS — C7802 Secondary malignant neoplasm of left lung: Secondary | ICD-10-CM | POA: Diagnosis not present

## 2024-05-20 DIAGNOSIS — C155 Malignant neoplasm of lower third of esophagus: Secondary | ICD-10-CM | POA: Diagnosis not present

## 2024-05-24 ENCOUNTER — Other Ambulatory Visit: Payer: Self-pay | Admitting: Family Medicine

## 2024-05-24 ENCOUNTER — Other Ambulatory Visit: Payer: Self-pay | Admitting: Hematology

## 2024-05-24 DIAGNOSIS — F330 Major depressive disorder, recurrent, mild: Secondary | ICD-10-CM

## 2024-05-24 DIAGNOSIS — F411 Generalized anxiety disorder: Secondary | ICD-10-CM

## 2024-05-24 DIAGNOSIS — B0230 Zoster ocular disease, unspecified: Secondary | ICD-10-CM

## 2024-05-27 ENCOUNTER — Encounter: Payer: Self-pay | Admitting: Hematology

## 2024-05-30 DIAGNOSIS — Z510 Encounter for antineoplastic radiation therapy: Secondary | ICD-10-CM | POA: Diagnosis not present

## 2024-05-30 DIAGNOSIS — C7802 Secondary malignant neoplasm of left lung: Secondary | ICD-10-CM | POA: Diagnosis not present

## 2024-05-30 DIAGNOSIS — C155 Malignant neoplasm of lower third of esophagus: Secondary | ICD-10-CM | POA: Diagnosis not present

## 2024-05-30 DIAGNOSIS — C3482 Malignant neoplasm of overlapping sites of left bronchus and lung: Secondary | ICD-10-CM | POA: Diagnosis not present

## 2024-06-03 ENCOUNTER — Ambulatory Visit
Admission: RE | Admit: 2024-06-03 | Discharge: 2024-06-03 | Disposition: A | Discharge status: Home / Self Care | Source: Ambulatory Visit | Attending: Radiation Oncology | Admitting: Radiation Oncology

## 2024-06-03 ENCOUNTER — Other Ambulatory Visit: Payer: Self-pay

## 2024-06-03 DIAGNOSIS — Z510 Encounter for antineoplastic radiation therapy: Secondary | ICD-10-CM | POA: Diagnosis not present

## 2024-06-03 DIAGNOSIS — C3482 Malignant neoplasm of overlapping sites of left bronchus and lung: Secondary | ICD-10-CM | POA: Diagnosis not present

## 2024-06-03 LAB — RAD ONC ARIA SESSION SUMMARY
Course Elapsed Days: 0
Plan Fractions Treated to Date: 1
Plan Prescribed Dose Per Fraction: 12 Gy
Plan Total Fractions Prescribed: 5
Plan Total Prescribed Dose: 60 Gy
Reference Point Dosage Given to Date: 12 Gy
Reference Point Session Dosage Given: 12 Gy
Session Number: 1

## 2024-06-04 ENCOUNTER — Ambulatory Visit: Admitting: Radiation Oncology

## 2024-06-05 ENCOUNTER — Ambulatory Visit
Admission: RE | Admit: 2024-06-05 | Discharge: 2024-06-05 | Disposition: A | Discharge status: Home / Self Care | Source: Ambulatory Visit | Attending: Radiation Oncology | Admitting: Radiation Oncology

## 2024-06-05 ENCOUNTER — Other Ambulatory Visit: Payer: Self-pay

## 2024-06-05 DIAGNOSIS — Z510 Encounter for antineoplastic radiation therapy: Secondary | ICD-10-CM | POA: Insufficient documentation

## 2024-06-05 DIAGNOSIS — C3482 Malignant neoplasm of overlapping sites of left bronchus and lung: Secondary | ICD-10-CM | POA: Insufficient documentation

## 2024-06-05 LAB — RAD ONC ARIA SESSION SUMMARY
Course Elapsed Days: 2
Plan Fractions Treated to Date: 2
Plan Prescribed Dose Per Fraction: 12 Gy
Plan Total Fractions Prescribed: 5
Plan Total Prescribed Dose: 60 Gy
Reference Point Dosage Given to Date: 24 Gy
Reference Point Session Dosage Given: 12 Gy
Session Number: 2

## 2024-06-06 ENCOUNTER — Ambulatory Visit: Admitting: Radiation Oncology

## 2024-06-10 ENCOUNTER — Other Ambulatory Visit: Payer: Self-pay

## 2024-06-10 ENCOUNTER — Ambulatory Visit
Admission: RE | Admit: 2024-06-10 | Discharge: 2024-06-10 | Disposition: A | Discharge status: Home / Self Care | Source: Ambulatory Visit | Attending: Radiation Oncology | Admitting: Radiation Oncology

## 2024-06-10 DIAGNOSIS — Z510 Encounter for antineoplastic radiation therapy: Secondary | ICD-10-CM | POA: Diagnosis not present

## 2024-06-10 LAB — RAD ONC ARIA SESSION SUMMARY
Course Elapsed Days: 7
Plan Fractions Treated to Date: 3
Plan Prescribed Dose Per Fraction: 12 Gy
Plan Total Fractions Prescribed: 5
Plan Total Prescribed Dose: 60 Gy
Reference Point Dosage Given to Date: 36 Gy
Reference Point Session Dosage Given: 12 Gy
Session Number: 3

## 2024-06-12 ENCOUNTER — Other Ambulatory Visit: Payer: Self-pay

## 2024-06-12 ENCOUNTER — Ambulatory Visit
Admission: RE | Admit: 2024-06-12 | Discharge: 2024-06-12 | Disposition: A | Discharge status: Home / Self Care | Source: Ambulatory Visit | Attending: Radiation Oncology | Admitting: Radiation Oncology

## 2024-06-12 DIAGNOSIS — Z510 Encounter for antineoplastic radiation therapy: Secondary | ICD-10-CM | POA: Diagnosis not present

## 2024-06-12 LAB — RAD ONC ARIA SESSION SUMMARY
Course Elapsed Days: 9
Plan Fractions Treated to Date: 4
Plan Prescribed Dose Per Fraction: 12 Gy
Plan Total Fractions Prescribed: 5
Plan Total Prescribed Dose: 60 Gy
Reference Point Dosage Given to Date: 48 Gy
Reference Point Session Dosage Given: 12 Gy
Session Number: 4

## 2024-06-13 ENCOUNTER — Ambulatory Visit: Admitting: Radiation Oncology

## 2024-06-14 ENCOUNTER — Ambulatory Visit
Admission: RE | Admit: 2024-06-14 | Discharge: 2024-06-14 | Disposition: A | Discharge status: Home / Self Care | Source: Ambulatory Visit | Attending: Radiation Oncology | Admitting: Radiation Oncology

## 2024-06-14 ENCOUNTER — Ambulatory Visit
Admission: RE | Admit: 2024-06-14 | Discharge: 2024-06-14 | Disposition: A | Discharge status: Home / Self Care | Source: Ambulatory Visit | Attending: Radiation Oncology

## 2024-06-14 ENCOUNTER — Other Ambulatory Visit: Payer: Self-pay

## 2024-06-14 DIAGNOSIS — Z510 Encounter for antineoplastic radiation therapy: Secondary | ICD-10-CM | POA: Diagnosis not present

## 2024-06-14 LAB — RAD ONC ARIA SESSION SUMMARY
Course Elapsed Days: 11
Plan Fractions Treated to Date: 5
Plan Prescribed Dose Per Fraction: 12 Gy
Plan Total Fractions Prescribed: 5
Plan Total Prescribed Dose: 60 Gy
Reference Point Dosage Given to Date: 60 Gy
Reference Point Session Dosage Given: 12 Gy
Session Number: 5

## 2024-06-17 NOTE — Radiation Completion Notes (Signed)
  Radiation Oncology         (336) [phone] ________________________________  Name: Cindy Byrd MRN: [id number]  Date of Service: 06/14/2024  DOB: 1941/02/12  End of Treatment Note    Diagnosis:  History of Stage IV poorly differentiated carcinoma of the lung and history of Adenocarcinoma of the distal esophagus with new LUL nodule significant for either recurrence of one of her cancers, or separate primary Stage IA2, cT1bN0M0, NSCLC.   Intent: Curative     ==========DELIVERED PLANS==========  First Treatment Date: 2024-06-03 Last Treatment Date: 2024-06-14   Plan Name: Lung_LUL_SBRT Site: Lung, Left Technique: SBRT/SRT-IMRT Mode: Photon Dose Per Fraction: 12 Gy Prescribed Dose (Delivered / Prescribed): 60 Gy / 60 Gy Prescribed Fxs (Delivered / Prescribed): 5 / 5     ==========ON TREATMENT VISIT DATES========== 2024-06-03, 2024-06-05, 2024-06-10, 2024-06-12, 2024-06-14, 2024-06-14   See weekly On Treatment Notes in Epic for details in the Media tab (listed as Progress notes on the On Treatment Visit Dates listed above). The patient tolerated radiation.    The patient will receive a call in about one month from the radiation oncology department. She will continue follow up with Dr. Onesimo as well.      Donald KYM Husband, PAC

## 2024-07-01 ENCOUNTER — Other Ambulatory Visit: Payer: Self-pay

## 2024-07-01 ENCOUNTER — Inpatient Hospital Stay: Attending: Hematology

## 2024-07-01 ENCOUNTER — Telehealth: Payer: Self-pay | Admitting: Hematology

## 2024-07-01 ENCOUNTER — Inpatient Hospital Stay: Admitting: Hematology

## 2024-07-01 ENCOUNTER — Inpatient Hospital Stay

## 2024-07-01 VITALS — BP 159/87 | HR 95 | Temp 98.4°F | Resp 17 | Wt 143.7 lb

## 2024-07-01 DIAGNOSIS — C3491 Malignant neoplasm of unspecified part of right bronchus or lung: Secondary | ICD-10-CM

## 2024-07-01 DIAGNOSIS — C3490 Malignant neoplasm of unspecified part of unspecified bronchus or lung: Secondary | ICD-10-CM | POA: Diagnosis present

## 2024-07-01 DIAGNOSIS — Z87891 Personal history of nicotine dependence: Secondary | ICD-10-CM | POA: Insufficient documentation

## 2024-07-01 DIAGNOSIS — C7951 Secondary malignant neoplasm of bone: Secondary | ICD-10-CM | POA: Insufficient documentation

## 2024-07-01 LAB — CBC WITH DIFFERENTIAL (CANCER CENTER ONLY)
Abs Immature Granulocytes: 0.04 K/uL (ref 0.00–0.07)
Basophils Absolute: 0.1 K/uL (ref 0.0–0.1)
Basophils Relative: 1 %
Eosinophils Absolute: 0.3 K/uL (ref 0.0–0.5)
Eosinophils Relative: 3 %
HCT: 41 % (ref 36.0–46.0)
Hemoglobin: 13.4 g/dL (ref 12.0–15.0)
Immature Granulocytes: 0 %
Lymphocytes Relative: 24 %
Lymphs Abs: 2.3 K/uL (ref 0.7–4.0)
MCH: 31.4 pg (ref 26.0–34.0)
MCHC: 32.7 g/dL (ref 30.0–36.0)
MCV: 96 fL (ref 80.0–100.0)
Monocytes Absolute: 1 K/uL (ref 0.1–1.0)
Monocytes Relative: 10 %
Neutro Abs: 5.8 K/uL (ref 1.7–7.7)
Neutrophils Relative %: 62 %
Platelet Count: 334 K/uL (ref 150–400)
RBC: 4.27 MIL/uL (ref 3.87–5.11)
RDW: 14.6 % (ref 11.5–15.5)
WBC Count: 9.4 K/uL (ref 4.0–10.5)
nRBC: 0 % (ref 0.0–0.2)

## 2024-07-01 LAB — CMP (CANCER CENTER ONLY)
ALT: 9 U/L (ref 0–44)
AST: 15 U/L (ref 15–41)
Albumin: 3.5 g/dL (ref 3.5–5.0)
Alkaline Phosphatase: 61 U/L (ref 38–126)
Anion gap: 4 — ABNORMAL LOW (ref 5–15)
BUN: 21 mg/dL (ref 8–23)
CO2: 28 mmol/L (ref 22–32)
Calcium: 9.6 mg/dL (ref 8.9–10.3)
Chloride: 109 mmol/L (ref 98–111)
Creatinine: 1.03 mg/dL — ABNORMAL HIGH (ref 0.44–1.00)
GFR, Estimated: 54 mL/min — ABNORMAL LOW (ref >60)
Glucose, Bld: 111 mg/dL — ABNORMAL HIGH (ref 70–99)
Potassium: 4.3 mmol/L (ref 3.5–5.1)
Sodium: 141 mmol/L (ref 135–145)
Total Bilirubin: 0.3 mg/dL (ref 0.0–1.2)
Total Protein: 7.6 g/dL (ref 6.5–8.1)

## 2024-07-01 MED ORDER — FENTANYL 25 MCG/HR TD PT72: 1.0000 | MEDICATED_PATCH | TRANSDERMAL | 0 refills | Status: AC

## 2024-07-01 MED ORDER — VALACYCLOVIR HCL 500 MG PO TABS: 500.0000 mg | ORAL_TABLET | Freq: Two times a day (BID) | ORAL | 3 refills | Status: AC

## 2024-07-01 MED ORDER — DENOSUMAB 120 MG/1.7ML ~~LOC~~ SOLN
120.0000 mg | Freq: Once | SUBCUTANEOUS | Status: AC
Start: 2024-07-01 — End: 2024-07-01
  Administered 2024-07-01: 120 mg via SUBCUTANEOUS
  Filled 2024-07-01: qty 1.7

## 2024-07-01 NOTE — Telephone Encounter (Signed)
 Cindy Byrd has been made aware of her follow up appointments scheduled for 10/21. Cindy Byrd asked that I send her a reminder in the mail so that she does not get her appointments mixed up again.

## 2024-07-02 ENCOUNTER — Inpatient Hospital Stay

## 2024-07-02 ENCOUNTER — Inpatient Hospital Stay: Admitting: Hematology

## 2024-07-02 NOTE — Progress Notes (Signed)
 HEMATOLOGY ONCOLOGY CLINIC VISIT NOTE  Date of service: 07/01/2024   Patient Care Team: Chandra Toribio POUR, MD as PCP - General (Family Medicine) Onesimo Emaline Brink, MD as Consulting Physician (Hematology and Oncology)  CHIEF COMPLAINTS/PURPOSE OF CONSULTATION:  Follow-up for continued evaluation and management of metastatic lung cancer Follow-up for esophageal cancer  DIAGNOSIS:   #1 Metastatic non-small cell lung cancer with bilateral lung nodules and large metastatic lesion in the left Ilium. 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 Also had c diff colitis - treated   Current Treatment  1) Active surveillance 2) Xgeva  120mg  [place] q12weeks for bone metastases. 3) Sandostatin  q4weeks for diarrhea   Previous Treatment  For metastatic lung cancer 1 Palliative radiation therapy to the large left ilium metastases 2. IV Nivolumab  x 20 cycles (discontinued due to likely immune colitis) 3. Xgeva  120mg  [place] q4weeks for bone metastases.  For Esophageal adenocarcinoma S/p Concurrent carbo/taxol  + RT  HISTORY OF PRESENTING ILLNESS: (plz see my previous consultation for details of initial presentation)  INTERVAL HISTORY:   Cindy Byrd is a 83 y.o. female who is here for continued evaluation and management of metastatic lung cancer and follow-up for esophageal cancer.   Patient was last seen by me via telemedicine visit on 05/07/2024.  Cindy Byrd  Patient continues to note significant intermittent discomfort from postherpetic neuralgia above her right eye and is awaiting a nerve ablation treatment at [facility]. No new chest pain or shortness of breath. No new focal bone pains. She tolerated her SBRT sided lung nodule well discussed getting a repeat CT of the chest without contrast prior to next clinic visit.   MEDICAL HISTORY:  Past Medical History:  Diagnosis Date   Barrett's esophagus    Bilateral pulmonary embolism (HCC)  09/02/2016   09/02/16 bilateral pulmonary emboli in context of extensive bilateral lower extremity deep venous thromboses Assumed hypercoagulability due to non-small cell metastatic lung cancer Lifelong anticoagulation recommended   Bone neoplasm 06/24/2015   Cancer (HCC)    metastatic poorly differentiated carcinoma. tumor left groin surgical removal with radiation tx.   Cataract    BILATERAL   Cigarette smoker two packs a day or less    Currently still smoking 2 PPD - Not interested in quitting at this time.   Colitis 2017   Colon polyps    hyperplastic, tubular adenomas, tubulovillous adenoma   Cough, persistent    hx. lung cancer ? primary-being evaluated, unsure of primary site.   Depression 06/24/2015   Diverticulosis    DVT of lower extremity, bilateral (HCC) 09/02/2016   Dysrhythmia    PAF in setting of submassive PE 09/2016   Emphysema of lung (HCC)    Endometriosis    Hysterectomy with BSO at age 20 yrs   Esophageal adenocarcinoma (HCC) 08/11/2015   intramucosal   Gastritis    GERD (gastroesophageal reflux disease)    H/O: pneumonia    Hiatal hernia    Hyperglycemia    A1c 7.4% 06/20/22   Hyperlipidemia    Hypertension 06/24/2015   likely improved incidental to 40 lbs weight loss from her neoplasm. No Longer taking med for this as of 08-06-15   IBS (irritable bowel syndrome)    Lung cancer (HCC)    suspected IV lung cancer with left ileum mets 2017, s/p left ileum radiaiton, immunotherapy   Pain    left hip-persistenttumor of bone-radiation tx. 10.   Pneumonia    Vitamin  D deficiency disease    SURGICAL HISTORY: Past Surgical History:  Procedure Laterality Date   ABDOMINAL HYSTERECTOMY     BALLOON DILATION N/A 10/08/2019   Procedure: BALLOON DILATION;  Surgeon: Albertus Gordy HERO, MD;  Location: [facility];  Service: Gastroenterology;  Laterality: N/A;   BARTHOLIN GLAND CYST EXCISION  83 yo ago   Does not want if it was an infected cyst or tumor. Was soon as  delivery   BIOPSY  01/02/2019   Procedure: BIOPSY;  Surgeon: Albertus Gordy HERO, MD;  Location: [facility];  Service: Gastroenterology;;   CATARACT EXTRACTION     COLONOSCOPY W/ POLYPECTOMY     multiple times - last done 09/2014 per patient.   ESOPHAGOGASTRODUODENOSCOPY (EGD) WITH PROPOFOL  N/A 08/11/2015   Procedure: ESOPHAGOGASTRODUODENOSCOPY (EGD) WITH PROPOFOL ;  Surgeon: Gordy HERO Albertus, MD;  Location: [facility];  Service: Gastroenterology;  Laterality: N/A;   ESOPHAGOGASTRODUODENOSCOPY (EGD) WITH PROPOFOL  N/A 01/02/2019   Procedure: ESOPHAGOGASTRODUODENOSCOPY (EGD) WITH PROPOFOL ;  Surgeon: Albertus Gordy HERO, MD;  Location: [facility];  Service: Gastroenterology;  Laterality: N/A;   ESOPHAGOGASTRODUODENOSCOPY (EGD) WITH PROPOFOL  N/A 10/08/2019   Procedure: ESOPHAGOGASTRODUODENOSCOPY (EGD) WITH PROPOFOL ;  Surgeon: Albertus Gordy HERO, MD;  Location: [facility];  Service: Gastroenterology;  Laterality: N/A;   EYE SURGERY Right 2024   Multiple surgeries over the past year.   FLEXIBLE SIGMOIDOSCOPY N/A 06/24/2017   Procedure: FLEXIBLE SIGMOIDOSCOPY;  Surgeon: Leigh Elspeth Mt, MD;  Location: [facility];  Service: Gastroenterology;  Laterality: N/A;   GANGLION CYST EXCISION     KNEE ARTHROSCOPY  age about 55 yrs   TONSILLECTOMY     TOTAL ABDOMINAL HYSTERECTOMY W/ BILATERAL SALPINGOOPHORECTOMY  at age 25 yrs   For endometriosis   VIDEO BRONCHOSCOPY WITH ENDOBRONCHIAL ULTRASOUND Bilateral 06/06/2023   Procedure: VIDEO BRONCHOSCOPY WITH ENDOBRONCHIAL ULTRASOUND;  Surgeon: Brenna Adine CROME, DO;  Location: [facility];  Service: Cardiopulmonary;  Laterality: Bilateral;    SOCIAL HISTORY: Social History   Socioeconomic History   Marital status: Widowed    Spouse name: Not on file   Number of children: 2   Years of education: Not on file   Highest education level: Not on file  Occupational History   Not on file  Tobacco Use   Smoking status: Former    Current packs/day: 0.00    Average  packs/day: 1 pack/day for 60.0 years (60.0 ttl pk-yrs)    Types: Cigarettes    Start date: 12/05/1954    Quit date: 12/05/2014    Years since quitting: 9.5    Passive exposure: Never   Smokeless tobacco: Never  Vaping Use   Vaping status: Never Used  Substance and Sexual Activity   Alcohol use: No    Alcohol/week: 0.0 standard drinks of alcohol   Drug use: No   Sexual activity: Not Currently  Other Topics Concern   Not on file  Social History Narrative   Not on file   Social Drivers of Health   Financial Resource Strain: Low Risk  (03/28/2023)   Overall Financial Resource Strain (CARDIA)    Difficulty of Paying Living Expenses: Not hard at all  Food Insecurity: No Food Insecurity (05/14/2024)   Hunger Vital Sign    Worried About Running Out of Food in the Last Year: Never true    Ran Out of Food in the Last Year: Never true  Transportation Needs: No Transportation Needs (05/14/2024)   PRAPARE - Administrator, Civil Service (Medical): No    Lack of Transportation (  Non-Medical): No  Physical Activity: Inactive (03/28/2023)   Exercise Vital Sign    Days of Exercise per Week: 0 days    Minutes of Exercise per Session: 0 min  Stress: No Stress Concern Present (03/28/2023)   [employer] of Occupational Health - Occupational Stress Questionnaire    Feeling of Stress : Not at all  Social Connections: Socially Isolated (01/31/2024)   Social Connection and Isolation Panel    Frequency of Communication with Friends and Family: Three times a week    Frequency of Social Gatherings with Friends and Family: Once a week    Attends Religious Services: Never    Database administrator or Organizations: No    Attends Banker Meetings: Never    Marital Status: Widowed  Intimate Partner Violence: Not At Risk (05/14/2024)   Humiliation, Afraid, Rape, and Kick questionnaire    Fear of Current or Ex-Partner: No    Emotionally Abused: No    Physically Abused: No     Sexually Abused: No    FAMILY HISTORY: Family History  Problem Relation Age of Onset   Colon cancer Brother    Colon cancer Brother    Stroke Mother    Colon cancer Father    Emphysema Father        smoked   Breast cancer Daughter 62       ER/PR+ stage II    ALLERGIES:  is allergic to penicillins, remeron  [mirtazapine ], and latex. patient wonders if she has a penicillin allergy but notes that she is uncertain about this.  MEDICATIONS:  Current Outpatient Medications  Medication Sig Dispense Refill   apixaban  (ELIQUIS ) 5 MG TABS tablet Take 1 tablet (5 mg total) by mouth 2 (two) times daily. 180 tablet 1   Budeson-Glycopyrrol-Formoterol  (BREZTRI  AEROSPHERE) 160-9-4.8 MCG/ACT AERO INHALE 2 PUFFS INTO THE LUNGS TWICE A DAY 10.7 each 1   buPROPion  (WELLBUTRIN  XL) 150 MG 24 hr tablet TAKE 1 TABLET BY MOUTH EVERY DAY 90 tablet 1   DULoxetine  (CYMBALTA ) 30 MG capsule TAKE 3 CAPSULES BY MOUTH EVERY DAY 270 capsule 1   fentaNYL  (DURAGESIC ) 25 MCG/HR Place 1 patch onto the skin every 3 (three) days. 10 patch 0   gabapentin  (NEURONTIN ) 800 MG tablet Take 1 tablet (800 mg total) by mouth 3 (three) times daily. 270 tablet 1   HYDROcodone -acetaminophen  (NORCO) 5-325 MG tablet Take 1 tablet by mouth every 6 (six) hours as needed for moderate pain (pain score 4-6). 30 tablet 0   lidocaine -prilocaine  (EMLA ) cream Apply 1 Application topically as needed. 30 g 0   omeprazole  (PRILOSEC) 40 MG capsule TAKE 1 CAPSULE BY MOUTH EVERY DAY BEFORE BREAKFAST 90 capsule 1   ondansetron  (ZOFRAN -ODT) 4 MG disintegrating tablet Take 1 tablet (4 mg total) by mouth every 8 (eight) hours as needed for nausea or vomiting. 20 tablet 0   traZODone  (DESYREL ) 100 MG tablet Take 100 mg by mouth at bedtime as needed for sleep.     valACYclovir  (VALTREX ) 500 MG tablet Take 1 tablet (500 mg total) by mouth 2 (two) times daily. 180 tablet 3   No current facility-administered medications for this visit.   REVIEW OF  SYSTEMS:  10 Point review of Systems was done is negative except as noted above.   PHYSICAL EXAMINATION:  .BP (!) 159/87 (BP Location: Left Arm, Patient Position: Sitting)   Pulse 95   Temp 98.4 F (36.9 C) (Oral)   Resp 17   Wt 143 lb 11.2 oz (65.2  kg)   SpO2 95%   BMI 23.91 kg/m   GENERAL:alert, in no acute distress and comfortable SKIN: no acute rashes, no significant lesions EYES: conjunctiva are pink and non-injected, sclera anicteric OROPHARYNX: MMM, no exudates, no oropharyngeal erythema or ulceration NECK: supple, no JVD LYMPH:  no palpable lymphadenopathy in the cervical, axillary or inguinal regions LUNGS: clear to auscultation b/l with normal respiratory effort HEART: regular rate & rhythm ABDOMEN:  normoactive bowel sounds , non tender, not distended. Extremity: no pedal edema PSYCH: alert & oriented x 3 with fluent speech NEURO: no focal motor/sensory deficits   LABORATORY DATA:  I have reviewed the data as listed  .    Latest Ref Rng & Units 07/01/2024   12:34 PM 04/10/2024    1:26 PM 02/26/2024    9:16 AM  CBC  WBC 4.0 - 10.5 K/uL 9.4  8.4  12.5   Hemoglobin 12.0 - 15.0 g/dL 86.5  86.2  86.9   Hematocrit 36.0 - 46.0 % 41.0  41.6  40.0   Platelets 150 - 400 K/uL 334  264  334    ANC 1.8k .    Latest Ref Rng & Units 07/01/2024   12:34 PM 04/10/2024    1:26 PM 02/26/2024    9:16 AM  CMP  Glucose 70 - 99 mg/dL 888  91  91   BUN 8 - 23 mg/dL 21  25  30    Creatinine 0.44 - 1.00 mg/dL 8.96  8.69  8.69   Sodium 135 - 145 mmol/L 141  139  137   Potassium 3.5 - 5.1 mmol/L 4.3  3.9  4.0   Chloride 98 - 111 mmol/L 109  106  105   CO2 22 - 32 mmol/L 28  25  26    Calcium  8.9 - 10.3 mg/dL 9.6  9.6  9.7   Total Protein 6.5 - 8.1 g/dL 7.6  7.9  7.7   Total Bilirubin 0.0 - 1.2 mg/dL 0.3  0.2  0.3   Alkaline Phos 38 - 126 U/L 61  68  46   AST 15 - 41 U/L 15  42  23   ALT 0 - 44 U/L 9  30  15          01/02/19 Esophagus Biopsy:    RADIOGRAPHIC STUDIES: .No  results found.   ASSESSMENT & PLAN:   83 y.o. female with  #1 Metastatic poorly differentiated carcinoma with likely lung primary non-small cell lung cancer.    CT of the head with and without contrast showed no evidence of metastatic disease. EGFR blood test mutation analysis negative. CT chest abdomen pelvis 04/19/2016 shows no evidence of disease progression. Patient tolerated Nivolumab  very well but was discontinued when she developed grade 2 Immune colitis. Has been off Nivolumab  for >6 months  CT chest abdomen pelvis on 06/24/2016 shows no evidence of new disease or progression of metastatic disease. CT chest abdomen pelvis 09/06/2016 shows 1. Mixed interval response to therapy. 2. There is a new left ventral chest wall lesion deep to the pectoralis musculature worrisome for metastatic disease. 3. Posterior lower lobe nodular densities are identified which may reflect areas of pulmonary metastasis. 4. Interval decrease in size of destructive lesion involving the left iliac bone.  CT chest abd pelvis 12/08/2016: Cystic mass involving the left ventral chest wall has resolved in the interval. Likely was a hematoma due to trauma. Interval increase in size of pleural base mass overlying the posterior and inferior left lower lobe.  There is also a new left pleural effusion identified.  CT chest 02/01/2017: Residual irregular soft tissue thickening/volume loss and trace left pleural fluid at the base of the left hemithorax, overall improved in appearance from 12/08/2016. No measurable lesion.  CT chest 05/29/2017 shows no residual pleural based mass or significant pleural effusion in the left hemithorax. No evidence of thoracic metastatic disease. No evidence of progressive metastatic disease within the abdomen or pelvis. Mixed lytic and blastic lesion involving the left iliac bone and associated pathologic fracture are unchanged.   CT CAP 09/14/17 shows no new changes. She does have slight  displacement of her fractured left iliac bone. Evidence of stable disease.   CT CAP 01/04/2018- No new or progressive metastatic disease. Stable large left iliac bone metastasis with associated chronic pathologic fracture.   CT chest/abd/pelvis done on 04/26/18 revealed Stable exam.  No new or progressive interval findings.  07/19/18 CT C/A/P revealed Stable exam.  No new or progressive interval findings. Large destructive left iliac lesion is similar to prior. Aortic Atherosclerosis and Emphysema.    11/06/18 CT C/A/P revealed Similar appearance of large mixed lytic and sclerotic lesion in the left ilium. No new metastatic lesions are otherwise noted elsewhere in the chest, abdomen or pelvis. 2. Interval development of thickening of the distal third of the esophagus. This is nonspecific, and could be related to underlying reflux esophagitis. However, if there is any clinical concern for Barrett's metaplasia or esophageal neoplasia, further evaluation with nonemergent endoscopy could be considered. 3. Aortic atherosclerosis, in addition to left main coronary artery disease. Assessment for potential risk factor modification, dietary therapy or pharmacologic therapy may be warranted, if clinically Indicated. 4. Diffuse bronchial wall thickening with mild to moderate centrilobular and paraseptal emphysema; imaging findings suggestive of underlying COPD. 5. Additional incidental findings, as above.  #2  Adenocarcinoma of the Esophagus  Barrett's esophagus 4cms in the distal esophagus with low and high-grade dysplasia  01/02/19 Surgical pathology revealed adenocarcinoma of the esophagus   01/25/19 PET/CT revealed Distal esophageal primary, without hypermetabolic metastatic disease. 2. Chronic left iliac metastasis, as before. 3. Hypermetabolism within and superficial to the right gluteal musculature is most likely related to trauma and/or injection sites. 4. Aortic atherosclerosis, coronary artery  atherosclerosis and emphysema.  S/p concurrent Carboplatin  and Taxol  weekly with RT of 45 Gy in 25 fractions and 5.4 Gy boost, completed between 02/04/19 and 03/27/19  07/03/2019 PET skull base to thigh revealed 1. Interval response to therapy. Significant reduction in FDG uptake associated with distal esophageal mass. SUV max currently 2.61 versus 16.97 previously. 2. 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 Unchanged 3. Aortic Atherosclerosis (ICD10-I70.0) and Emphysema (ICD10-J43.9). Coronary artery calcifications.  07/15/2020 CT C/A/P ([id number]) ([id number]) revealed 1. No evidence of new or progressive metastatic disease in the chest, abdomen or pelvis.  #3 diarrhea-  now resolved was previously. 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 She was previously on on Lialda , budesonide ,probiotics and lomotil  but not currently taking any of these. Plan -Continue Sandostatin  every 4 weeks   #4 h/o DVT and PE  -continue on Xarelto  - no issues with bleeding   #5 history of COPD management per primary care physician  #6 hx of severe shingles of the forehead with postherpetic neuralgia  PLAN:  -patient has completed receiving SBRT to new LUL new lung primary with radiation oncologist, Dr.  Moody -Currently patient does not have any signs of progression of her lung or esophageal cancer. Continues with Xarelto  for her history of recurrent VTE -continue Xgeva  every 12 weeks.  No new dental issues -CT chest wo contrast in 10 weeks -she shall return to clinic in 12 weeks -f/u  With [facility] for interventional pain management to address postherpetic neuralgia discomfort over the right eye.  FOLLOW-UP: CT chest wo contrast in 10 weeks RTC with Dr Onesimo in 12 weeks with port flush/labs continue Xgeva  every 12 weeks  The total time spent in the appointment was  32 minutes* .  All of the patient's questions were answered with apparent satisfaction. The patient knows to call the clinic with any problems, questions or concerns.   Emaline Onesimo MD MS AAHIVMS [facility] Hematology/Oncology Physician [facility]  .*Total Encounter Time as defined by the Centers for Medicare and Medicaid Services includes, in addition to the face-to-face time of a patient visit (documented in the note above) non-face-to-face time: obtaining and reviewing outside history, ordering and reviewing medications, tests or procedures, care coordination (communications with other health care professionals or caregivers) and documentation in the medical record.    I,Mitra Faeizi,acting as a Neurosurgeon for Emaline Onesimo, MD.,have documented all relevant documentation on the behalf of Emaline Onesimo, MD,as directed by  Emaline Onesimo, MD while in the presence of Emaline Onesimo, MD.  .I have reviewed the above documentation for accuracy and completeness, and I agree with the above. .Lakasha Mcfall Kishore Rolondo Pierre MD

## 2024-07-03 ENCOUNTER — Inpatient Hospital Stay

## 2024-07-09 ENCOUNTER — Encounter: Payer: Self-pay | Admitting: Hematology

## 2024-07-10 ENCOUNTER — Encounter: Payer: Self-pay | Admitting: Hematology

## 2024-07-12 NOTE — Progress Notes (Signed)
  Radiation Oncology         [phone] ________________________________  Name: Cindy Byrd MRN: [id number]  Date of Service: 07/15/2024  DOB: 1941/02/14  Post Treatment Telephone Note  Diagnosis:   History of Stage IV poorly differentiated carcinoma of the lung and history of Adenocarcinoma of the distal esophagus with new LUL nodule significant for either recurrence of one of her cancers, or separate primary Stage IA2, [id number], NSCLC.     The patient was available for call today.   Symptoms of fatigue have improved since completing therapy.  Symptoms of skin changes have improved since completing therapy.  No symptoms of esophagitis.   The patient has scheduled follow up with her medical oncologist Dr. Onesimo for ongoing care, and was encouraged to call if she develops concerns or questions regarding radiation.

## 2024-07-15 ENCOUNTER — Ambulatory Visit
Admission: RE | Admit: 2024-07-15 | Discharge: 2024-07-15 | Disposition: A | Discharge status: Home / Self Care | Source: Ambulatory Visit | Attending: Hematology | Admitting: Hematology

## 2024-07-15 DIAGNOSIS — C3412 Malignant neoplasm of upper lobe, left bronchus or lung: Secondary | ICD-10-CM | POA: Insufficient documentation

## 2024-08-07 ENCOUNTER — Telehealth: Payer: Self-pay | Admitting: Radiation Oncology

## 2024-08-07 NOTE — Telephone Encounter (Signed)
 Received VM from pt advising she is needing to schedule CT SIM and has not received a call. Pt was contacted and advised that CT SIMs are only done prior to starting tx and she has already completed tx. She was also advised there are orders in her chart from Dr. Onesimo for CT Chest, I could transfer her to Radiology to have them schedule. She expressed frustration, stating she's never had to do that before and it didn't make sense why I could not schedule this. I advised that I am unable to schedule for other departments but would gladly transfer her directly to help with this. She was adamant something is wrong and messed up since it's been over a month since completing treatment and Dr. Dewey said [she]'d get a CT done to make sure the treatment worked. I reiterated this is done through radiology - CT SCAN not SIM. She refused transfer and requested to speak with Dr. Dewey. I advised I would pass a message along to his team so his nurse or PA could reach out. She was agreeable to this and awaits call.

## 2024-08-09 ENCOUNTER — Encounter: Payer: Self-pay | Admitting: Hematology

## 2024-08-12 ENCOUNTER — Encounter: Payer: Self-pay | Admitting: Family Medicine

## 2024-08-12 ENCOUNTER — Ambulatory Visit (INDEPENDENT_AMBULATORY_CARE_PROVIDER_SITE_OTHER): Admitting: Family Medicine

## 2024-08-12 ENCOUNTER — Encounter: Payer: Self-pay | Admitting: Hematology

## 2024-08-12 VITALS — BP 114/70 | HR 98 | Ht 65.0 in | Wt 142.1 lb

## 2024-08-12 DIAGNOSIS — B0229 Other postherpetic nervous system involvement: Secondary | ICD-10-CM | POA: Diagnosis not present

## 2024-08-12 DIAGNOSIS — L304 Erythema intertrigo: Secondary | ICD-10-CM

## 2024-08-12 DIAGNOSIS — G4700 Insomnia, unspecified: Secondary | ICD-10-CM | POA: Diagnosis not present

## 2024-08-12 DIAGNOSIS — Z23 Encounter for immunization: Secondary | ICD-10-CM | POA: Diagnosis not present

## 2024-08-12 DIAGNOSIS — N390 Urinary tract infection, site not specified: Secondary | ICD-10-CM

## 2024-08-12 MED ORDER — OXCARBAZEPINE 150 MG PO TABS: ORAL_TABLET | ORAL | 1 refills | Status: DC

## 2024-08-12 NOTE — Patient Instructions (Signed)
 It was nice to see you today,  We addressed the following topics today: -I am sending in a new medication that may help with your eye pain.  It is called oxcarbazepine .  It is similar to carbamazepine which you have tried in the past but is better tolerated.  I have provided some more information on this medication. - Start by taking it prior to bed in the evening.  If you tolerate it, you can increase to taking it twice a day.  Have a great day,  Rolan Slain, MD

## 2024-08-12 NOTE — Progress Notes (Unsigned)
 Established Patient Office Visit  Subjective   Patient ID: Cindy Byrd, female    DOB: 01-31-1941  Age: 83 y.o. MRN: [id number]  Chief Complaint  Patient presents with   Medical Management of Chronic Issues    HPI  Nerve block  Xgeva   Subjective - Follow-up for multiple chronic issues. Reports no new major issues since last visit approximately six months ago.  - Intermittent skin rash, likely fungal or contact dermatitis, recurs with heat and sweat. Previously used a cream twice daily with some effect.  - Post-herpetic neuralgia of the eye continues. Describes pain as excruciating but intermittent, triggered by cold. Reports two prior treatments were ineffective and will not pursue another similar treatment.  - Recurrent UTIs, with the most recent episode about one month ago treated at an urgent care center. Reports increased frequency of UTIs compared to earlier in life.  - Reports poor sleep, often sleeping only 2-3 hours and then unable to return to sleep.  Medications Current medications include gabapentin  800 mg three times daily, bupropion , duloxetine  (Cymbalta ), trazodone  at night for sleep (currently out of stock), fentanyl  patch, Breo Ellipta  inhaler, and apixaban  (Eliquis ).  PMH, PSH, FH, Social Hx PMHx: History of cancer with a lung nodule identified in May, treated with five radiation treatments completed in July. Awaiting follow-up CT scan. History of post-herpetic neuralgia. Recurrent urinary tract infections. Uses home oxygen , primarily at night, but is prescribed for 24/7 use. PSH: No new procedures mentioned.  ROS Integumentary: Reports intermittent rash in skin folds, exacerbated by heat/sweat. Neurologic: Reports ongoing post-herpetic neuralgia eye pain. Denies increased severity. GU: Reports recent urinary tract infection.  Objective General: No acute distress. CV: Regular rate and rhythm. PULM: Lungs clear to auscultation  bilaterally.  Assessment and Plan Post-Herpetic Neuralgia (Eye): Ongoing significant, intermittent neuropathic eye pain secondary to shingles. Current treatment with gabapentin  800mg  TID and fentanyl  patch provides inadequate relief. Prior trial of carbamazepine was poorly tolerated due to dizziness. Two recent procedural interventions were ineffective. - Start oxcarbazepine  at a low dose. Counseled on potential side effects including sedation, fatigue, and confusion. Advised to take at night and to stop if not tolerated. - Continue gabapentin  800mg  TID. - Continue fentanyl  patch.  Insomnia: Reports difficulty initiating and maintaining sleep, sleeping 2-3 hours per night. Currently out of trazodone . - Advised that the new medication, oxcarbazepine , may cause drowsiness and could help with sleep. - Will re-evaluate the need for a dedicated sleep aid at follow-up depending on the effects of oxcarbazepine .  Intertrigo: Intermittent rash, consistent with fungal infection or contact dermatitis. Worsens with heat and moisture. - Counseled on keeping the area dry by patting with a towel and changing damp clothes. - Advised against using powders. - Recommended over-the-counter barrier creams like Desitin as needed. - Sent a new prescription for a topical antifungal cream.  Lung Nodule: History of lung nodule found on PET scan in May, with subsequent radiation therapy completed in July. - Awaiting follow-up CT scan to assess treatment response.  Recurrent UTIs: Reports another UTI approximately one month ago. Increasing frequency is a concern. - Will monitor. Discussed a cystoscopy as a potential diagnostic step if infections continue to recur.  Health Maintenance: - Influenza vaccine administered in clinic today. - Follow-up in 6-8 weeks to assess tolerance and efficacy of oxcarbazepine . If tolerated, will return to 71-month follow-up intervals.     The ASCVD Risk score (Cindy DK, et al.,  2019) failed to calculate for the following reasons:  The 2019 ASCVD risk score is only valid for ages 73 to 35  Health Maintenance Due  Topic Date Due   DTaP/Tdap/Td (1 - Tdap) Never done   Pneumococcal Vaccine: 50+ Years (1 of 2 - PCV) Never done   COVID-19 Vaccine (3 - Pfizer risk series) 11/03/2020   Zoster Vaccines- Shingrix (2 of 2) 03/20/2023   Medicare Annual Wellness (AWV)  03/27/2024      Objective:     BP 114/70   Pulse 98   Ht 5' 5 (1.651 m)   Wt 142 lb 1.9 oz (64.5 kg)   SpO2 93%   BMI 23.65 kg/m  {Vitals History (Optional):23777}  Physical Exam   No results found for any visits on 08/12/24.      Assessment & Plan:   There are no diagnoses linked to this encounter.   No follow-ups on file.    Toribio MARLA Slain, MD

## 2024-08-13 ENCOUNTER — Telehealth: Payer: Self-pay | Admitting: Radiation Oncology

## 2024-08-13 NOTE — Telephone Encounter (Signed)
 I attempted to call the patient and her son without connecting as her VM is not set up and his was full, but had received a message stating the patient was upset about the coordination of her appointment for her post treatment diagnostic CT, however she called our office to coordinate what she described as treatment planning. It appears this has been cleared up and she is scheduled for her diagnostic follow up scan ordered by Dr. Onesimo as well as follow up in his clinic to review this result. We will be available as needed moving forward.

## 2024-08-16 DIAGNOSIS — N390 Urinary tract infection, site not specified: Secondary | ICD-10-CM | POA: Insufficient documentation

## 2024-08-16 DIAGNOSIS — G4700 Insomnia, unspecified: Secondary | ICD-10-CM | POA: Insufficient documentation

## 2024-08-16 DIAGNOSIS — B0229 Other postherpetic nervous system involvement: Secondary | ICD-10-CM | POA: Insufficient documentation

## 2024-08-16 NOTE — Assessment & Plan Note (Addendum)
 Ongoing significant, intermittent neuropathic eye pain secondary to shingles. Current treatment with gabapentin  800mg  TID and fentanyl  patch provides inadequate relief. Prior trial of carbamazepine was poorly tolerated due to dizziness. Two recent procedural interventions including trigeminal nerve block were ineffective. - Start oxcarbazepine  at a low dose. Counseled on potential side effects including sedation, fatigue, and confusion. Advised to take at night and to stop if not tolerated. - Continue gabapentin  800mg  TID. - Continue fentanyl  patch.

## 2024-08-16 NOTE — Assessment & Plan Note (Signed)
 Reports difficulty initiating and maintaining sleep, sleeping 2-3 hours per night. Currently out of trazodone . - Advised that the new medication, oxcarbazepine , may cause drowsiness and could help with sleep. - Will re-evaluate the need for a dedicated sleep aid at follow-up depending on the effects of oxcarbazepine .

## 2024-08-16 NOTE — Assessment & Plan Note (Signed)
 Reports another UTI approximately one month ago. Increasing frequency is a concern. - Will monitor. Discussed a cystoscopy as a potential diagnostic step if infections continue to recur.

## 2024-08-16 NOTE — Assessment & Plan Note (Signed)
 Intermittent rash, consistent with fungal infection or contact dermatitis. Worsens with heat and moisture. - Counseled on keeping the area dry by patting with a towel and changing damp clothes. - Advised against using powders. - Recommended over-the-counter barrier creams like Desitin as needed. - Sent a new prescription for a topical antifungal cream.

## 2024-08-19 ENCOUNTER — Telehealth: Payer: Self-pay

## 2024-08-19 ENCOUNTER — Other Ambulatory Visit: Payer: Self-pay | Admitting: Family Medicine

## 2024-08-19 DIAGNOSIS — I2699 Other pulmonary embolism without acute cor pulmonale: Secondary | ICD-10-CM

## 2024-08-19 MED ORDER — APIXABAN 5 MG PO TABS: 5.0000 mg | ORAL_TABLET | Freq: Two times a day (BID) | ORAL | 1 refills | Status: AC

## 2024-08-19 NOTE — Telephone Encounter (Signed)
 Copied from CRM [id number]. Topic: Clinical - Medical Advice >> Aug 19, 2024 11:05 AM [place] E wrote: Reason for CRM: Patient stated that she was started on OXcarbazepine  (TRILEPTAL ) 150 MG tablet at her last visit and she could not tolerate this medication as it made her feel dull, so she switched back to her gabapentin .

## 2024-08-19 NOTE — Telephone Encounter (Signed)
 Copied from CRM #[id number]. Topic: Clinical - Medication Refill >> Aug 19, 2024 11:04 AM [place] E wrote: Medication: apixaban  (ELIQUIS ) 5 MG TABS tablet  Has the patient contacted their pharmacy? Yes (Agent: If no, request that the patient contact the pharmacy for the refill. If patient does not wish to contact the pharmacy document the reason why and proceed with request.) (Agent: If yes, when and what did the pharmacy advise?)  This is the patient's preferred pharmacy:  [id number] - [place], [place] - [address] AT [facility] [address] [place] [address] Phone: [phone] Fax: [phone]   Is this the correct pharmacy for this prescription? Yes If no, delete pharmacy and type the correct one.   Has the prescription been filled recently? No  Is the patient out of the medication? No, one pill left.  Has the patient been seen for an appointment in the last year OR does the patient have an upcoming appointment? Yes  Can we respond through MyChart? Yes  Agent: Please be advised that Rx refills may take up to 3 business days. We ask that you follow-up with your pharmacy.

## 2024-08-28 ENCOUNTER — Other Ambulatory Visit: Payer: Self-pay | Admitting: Family Medicine

## 2024-08-28 DIAGNOSIS — B0230 Zoster ocular disease, unspecified: Secondary | ICD-10-CM

## 2024-09-02 ENCOUNTER — Ambulatory Visit (HOSPITAL_COMMUNITY)
Admission: RE | Admit: 2024-09-02 | Discharge: 2024-09-02 | Disposition: A | Discharge status: Home / Self Care | Source: Ambulatory Visit | Attending: Hematology | Admitting: Hematology

## 2024-09-02 DIAGNOSIS — C3490 Malignant neoplasm of unspecified part of unspecified bronchus or lung: Secondary | ICD-10-CM | POA: Diagnosis present

## 2024-09-03 ENCOUNTER — Ambulatory Visit (HOSPITAL_COMMUNITY)

## 2024-09-20 ENCOUNTER — Other Ambulatory Visit: Payer: Self-pay

## 2024-09-20 DIAGNOSIS — C3491 Malignant neoplasm of unspecified part of right bronchus or lung: Secondary | ICD-10-CM

## 2024-09-23 ENCOUNTER — Inpatient Hospital Stay: Attending: Hematology | Admitting: Hematology

## 2024-09-23 ENCOUNTER — Inpatient Hospital Stay

## 2024-09-23 VITALS — BP 150/77 | HR 83 | Temp 97.5°F | Resp 16 | Ht 65.0 in | Wt 144.0 lb

## 2024-09-23 DIAGNOSIS — C155 Malignant neoplasm of lower third of esophagus: Secondary | ICD-10-CM

## 2024-09-23 DIAGNOSIS — C3490 Malignant neoplasm of unspecified part of unspecified bronchus or lung: Secondary | ICD-10-CM | POA: Insufficient documentation

## 2024-09-23 DIAGNOSIS — C7951 Secondary malignant neoplasm of bone: Secondary | ICD-10-CM | POA: Insufficient documentation

## 2024-09-23 DIAGNOSIS — Z79899 Other long term (current) drug therapy: Secondary | ICD-10-CM | POA: Insufficient documentation

## 2024-09-23 DIAGNOSIS — C3491 Malignant neoplasm of unspecified part of right bronchus or lung: Secondary | ICD-10-CM

## 2024-09-23 DIAGNOSIS — Z79624 Long term (current) use of inhibitors of nucleotide synthesis: Secondary | ICD-10-CM | POA: Insufficient documentation

## 2024-09-23 DIAGNOSIS — Z803 Family history of malignant neoplasm of breast: Secondary | ICD-10-CM | POA: Insufficient documentation

## 2024-09-23 DIAGNOSIS — Z86718 Personal history of other venous thrombosis and embolism: Secondary | ICD-10-CM | POA: Diagnosis not present

## 2024-09-23 DIAGNOSIS — Z800 Family history of malignant neoplasm of digestive organs: Secondary | ICD-10-CM | POA: Insufficient documentation

## 2024-09-23 DIAGNOSIS — Z7901 Long term (current) use of anticoagulants: Secondary | ICD-10-CM | POA: Diagnosis not present

## 2024-09-23 LAB — CBC WITH DIFFERENTIAL (CANCER CENTER ONLY)
Abs Immature Granulocytes: 0.04 K/uL (ref 0.00–0.07)
Basophils Absolute: 0.1 K/uL (ref 0.0–0.1)
Basophils Relative: 1 %
Eosinophils Absolute: 0.2 K/uL (ref 0.0–0.5)
Eosinophils Relative: 2 %
HCT: 38 % (ref 36.0–46.0)
Hemoglobin: 12.4 g/dL (ref 12.0–15.0)
Immature Granulocytes: 0 %
Lymphocytes Relative: 20 %
Lymphs Abs: 2.1 K/uL (ref 0.7–4.0)
MCH: 31.1 pg (ref 26.0–34.0)
MCHC: 32.6 g/dL (ref 30.0–36.0)
MCV: 95.2 fL (ref 80.0–100.0)
Monocytes Absolute: 0.9 K/uL (ref 0.1–1.0)
Monocytes Relative: 9 %
Neutro Abs: 7.1 K/uL (ref 1.7–7.7)
Neutrophils Relative %: 68 %
Platelet Count: 331 K/uL (ref 150–400)
RBC: 3.99 MIL/uL (ref 3.87–5.11)
RDW: 14.5 % (ref 11.5–15.5)
WBC Count: 10.5 K/uL (ref 4.0–10.5)
nRBC: 0 % (ref 0.0–0.2)

## 2024-09-23 LAB — CMP (CANCER CENTER ONLY)
ALT: 16 U/L (ref 0–44)
AST: 22 U/L (ref 15–41)
Albumin: 3.6 g/dL (ref 3.5–5.0)
Alkaline Phosphatase: 51 U/L (ref 38–126)
Anion gap: 4 — ABNORMAL LOW (ref 5–15)
BUN: 15 mg/dL (ref 8–23)
CO2: 29 mmol/L (ref 22–32)
Calcium: 9.8 mg/dL (ref 8.9–10.3)
Chloride: 107 mmol/L (ref 98–111)
Creatinine: 0.87 mg/dL (ref 0.44–1.00)
GFR, Estimated: >60 mL/min (ref >60)
Glucose, Bld: 128 mg/dL — ABNORMAL HIGH (ref 70–99)
Potassium: 3.6 mmol/L (ref 3.5–5.1)
Sodium: 140 mmol/L (ref 135–145)
Total Bilirubin: 0.3 mg/dL (ref 0.0–1.2)
Total Protein: 7.5 g/dL (ref 6.5–8.1)

## 2024-09-23 MED ORDER — DOXYCYCLINE HYCLATE 100 MG PO TABS: 100.0000 mg | ORAL_TABLET | Freq: Two times a day (BID) | ORAL | 0 refills | Status: AC

## 2024-09-23 MED ORDER — DENOSUMAB 120 MG/1.7ML ~~LOC~~ SOLN
120.0000 mg | Freq: Once | SUBCUTANEOUS | Status: AC
  Administered 2024-09-23: 120 mg via SUBCUTANEOUS
  Filled 2024-09-23: qty 1.7

## 2024-09-23 NOTE — Progress Notes (Signed)
 HEMATOLOGY ONCOLOGY CLINIC VISIT NOTE  Date of service: 07/01/2024   Patient Care Team: Chandra Toribio POUR, MD as PCP - General (Family Medicine) Onesimo Emaline Brink, MD as Consulting Physician (Hematology and Oncology)  CHIEF COMPLAINTS/PURPOSE OF CONSULTATION:  Follow-up for continued evaluation and management of metastatic lung cancer Follow-up for esophageal cancer  DIAGNOSIS:   #1 Metastatic non-small cell lung cancer with bilateral lung nodules and large metastatic lesion in the left Ilium. 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 Also had c diff colitis - treated   Current Treatment  1) Active surveillance 2) Xgeva  120mg  [place] q12weeks for bone metastases. 3) Sandostatin  q4weeks for diarrhea   Previous Treatment  For metastatic lung cancer 1 Palliative radiation therapy to the large left ilium metastases 2. IV Nivolumab  x 20 cycles (discontinued due to likely immune colitis) 3. Xgeva  120mg  [place] q4weeks for bone metastases.  For Esophageal adenocarcinoma S/p Concurrent carbo/taxol  + RT  HISTORY OF PRESENTING ILLNESS: (plz see my previous consultation for details of initial presentation)  INTERVAL HISTORY:   Cindy Byrd is a 83 y.o. female who is here for continued evaluation and management of metastatic lung cancer and follow-up for esophageal cancer. Ambulating in a wheelchair today.  Patient was last seen by me via telemedicine visit on 05/07/2024. She reported no new chest pain, shortness of breath, or new focal bone pains, and said that she had tolerated her SBRT sided lung nodule well.  Today, she reports that she is doing well and does not have any concerns, though continues to note significant intermittent discomfort from postherpetic neuralgia above her right eye and is awaiting a nerve ablation treatment at [facility], however the pain has become localized. She says that she will return to see Pain  Management in November. States that she no longer has any vision in her right eye and has yet to have seen an eye doctor; she admits that she has been driving short distances but receives assistance from her roommate for longer distances.   She does query about the possibility of increasing her Trazodone  for better sleep, but is not sure what exactly is interrupting her sleep. She takes multiple sedatives, such as Gabapentin  (3x/ day, takes 3rd dose around 6-7PM, does not make her drowsy, doesn't go to bed until around 11p  - 12a), Duloxetine , Fentanyl  patch, and Norco (used very rarely).   She says that she did see interventional radiology and notes that she would prefer to return back under Dr. Alline care if she requires radiation treatment in the future.   Endorses a new cough that began 2 weeks ago, is productive with white-yellow mucus and worsened today.  Otherwise denies new chest pain, change in bowel habits (does take a stool softener), dysphagia, or abdominal pain.   MEDICAL HISTORY:  Past Medical History:  Diagnosis Date   Barrett's esophagus    Bilateral pulmonary embolism (HCC) 09/02/2016   09/02/16 bilateral pulmonary emboli in context of extensive bilateral lower extremity deep venous thromboses Assumed hypercoagulability due to non-small cell metastatic lung cancer Lifelong anticoagulation recommended   Bone neoplasm 06/24/2015   Cancer (HCC)    metastatic poorly differentiated carcinoma. tumor left groin surgical removal with radiation tx.   Cataract    BILATERAL   Cigarette smoker two packs a day or less    Currently still smoking 2 PPD - Not interested in quitting at this time.   Colitis 2017  Colon polyps    hyperplastic, tubular adenomas, tubulovillous adenoma   Cough, persistent    hx. lung cancer ? primary-being evaluated, unsure of primary site.   Depression 06/24/2015   Diverticulosis    DVT of lower extremity, bilateral (HCC) 09/02/2016   Dysrhythmia     PAF in setting of submassive PE 09/2016   Emphysema of lung (HCC)    Endometriosis    Hysterectomy with BSO at age 59 yrs   Esophageal adenocarcinoma (HCC) 08/11/2015   intramucosal   Gastritis    GERD (gastroesophageal reflux disease)    H/O: pneumonia    Hiatal hernia    Hyperglycemia    A1c 7.4% 06/20/22   Hyperlipidemia    Hypertension 06/24/2015   likely improved incidental to 40 lbs weight loss from her neoplasm. No Longer taking med for this as of 08-06-15   IBS (irritable bowel syndrome)    Lung cancer (HCC)    suspected IV lung cancer with left ileum mets 2017, s/p left ileum radiaiton, immunotherapy   Pain    left hip-persistenttumor of bone-radiation tx. 10.   Pneumonia    Vitamin D  deficiency disease    SURGICAL HISTORY: Past Surgical History:  Procedure Laterality Date   ABDOMINAL HYSTERECTOMY     BALLOON DILATION N/A 10/08/2019   Procedure: BALLOON DILATION;  Surgeon: Albertus Gordy HERO, MD;  Location: [facility];  Service: Gastroenterology;  Laterality: N/A;   BARTHOLIN GLAND CYST EXCISION  83 yo ago   Does not want if it was an infected cyst or tumor. Was soon as delivery   BIOPSY  01/02/2019   Procedure: BIOPSY;  Surgeon: Albertus Gordy HERO, MD;  Location: [facility];  Service: Gastroenterology;;   CATARACT EXTRACTION     COLONOSCOPY W/ POLYPECTOMY     multiple times - last done 09/2014 per patient.   ESOPHAGOGASTRODUODENOSCOPY (EGD) WITH PROPOFOL  N/A 08/11/2015   Procedure: ESOPHAGOGASTRODUODENOSCOPY (EGD) WITH PROPOFOL ;  Surgeon: Gordy HERO Albertus, MD;  Location: [facility];  Service: Gastroenterology;  Laterality: N/A;   ESOPHAGOGASTRODUODENOSCOPY (EGD) WITH PROPOFOL  N/A 01/02/2019   Procedure: ESOPHAGOGASTRODUODENOSCOPY (EGD) WITH PROPOFOL ;  Surgeon: Albertus Gordy HERO, MD;  Location: [facility];  Service: Gastroenterology;  Laterality: N/A;   ESOPHAGOGASTRODUODENOSCOPY (EGD) WITH PROPOFOL  N/A 10/08/2019   Procedure: ESOPHAGOGASTRODUODENOSCOPY (EGD) WITH PROPOFOL ;   Surgeon: Albertus Gordy HERO, MD;  Location: [facility];  Service: Gastroenterology;  Laterality: N/A;   EYE SURGERY Right 2024   Multiple surgeries over the past year.   FLEXIBLE SIGMOIDOSCOPY N/A 06/24/2017   Procedure: FLEXIBLE SIGMOIDOSCOPY;  Surgeon: Leigh Elspeth Mt, MD;  Location: [facility];  Service: Gastroenterology;  Laterality: N/A;   GANGLION CYST EXCISION     KNEE ARTHROSCOPY  age about 37 yrs   TONSILLECTOMY     TOTAL ABDOMINAL HYSTERECTOMY W/ BILATERAL SALPINGOOPHORECTOMY  at age 58 yrs   For endometriosis   VIDEO BRONCHOSCOPY WITH ENDOBRONCHIAL ULTRASOUND Bilateral 06/06/2023   Procedure: VIDEO BRONCHOSCOPY WITH ENDOBRONCHIAL ULTRASOUND;  Surgeon: Brenna Adine CROME, DO;  Location: [facility];  Service: Cardiopulmonary;  Laterality: Bilateral;    SOCIAL HISTORY: Social History   Socioeconomic History   Marital status: Widowed    Spouse name: Not on file   Number of children: 2   Years of education: Not on file   Highest education level: Not on file  Occupational History   Not on file  Tobacco Use   Smoking status: Former    Current packs/day: 0.00    Average packs/day: 1 pack/day for 60.0 years (60.0  ttl pk-yrs)    Types: Cigarettes    Start date: 12/05/1954    Quit date: 12/05/2014    Years since quitting: 9.8    Passive exposure: Never   Smokeless tobacco: Never  Vaping Use   Vaping status: Never Used  Substance and Sexual Activity   Alcohol use: No    Alcohol/week: 0.0 standard drinks of alcohol   Drug use: No   Sexual activity: Not Currently  Other Topics Concern   Not on file  Social History Narrative   Not on file   Social Drivers of Health   Financial Resource Strain: Low Risk  (03/28/2023)   Overall Financial Resource Strain (CARDIA)    Difficulty of Paying Living Expenses: Not hard at all  Food Insecurity: No Food Insecurity (05/14/2024)   Hunger Vital Sign    Worried About Running Out of Food in the Last Year: Never true    Ran Out of  Food in the Last Year: Never true  Transportation Needs: No Transportation Needs (05/14/2024)   PRAPARE - Administrator, Civil Service (Medical): No    Lack of Transportation (Non-Medical): No  Physical Activity: Inactive (03/28/2023)   Exercise Vital Sign    Days of Exercise per Week: 0 days    Minutes of Exercise per Session: 0 min  Stress: No Stress Concern Present (03/28/2023)   [employer] of Occupational Health - Occupational Stress Questionnaire    Feeling of Stress : Not at all  Social Connections: Socially Isolated (01/31/2024)   Social Connection and Isolation Panel    Frequency of Communication with Friends and Family: Three times a week    Frequency of Social Gatherings with Friends and Family: Once a week    Attends Religious Services: Never    Database Administrator or Organizations: No    Attends Banker Meetings: Never    Marital Status: Widowed  Intimate Partner Violence: Not At Risk (05/14/2024)   Humiliation, Afraid, Rape, and Kick questionnaire    Fear of Current or Ex-Partner: No    Emotionally Abused: No    Physically Abused: No    Sexually Abused: No    FAMILY HISTORY: Family History  Problem Relation Age of Onset   Colon cancer Brother    Colon cancer Brother    Stroke Mother    Colon cancer Father    Emphysema Father        smoked   Breast cancer Daughter 66       ER/PR+ stage II    ALLERGIES:  is allergic to penicillins, remeron  [mirtazapine ], and latex. patient wonders if she has a penicillin allergy but notes that she is uncertain about this.  MEDICATIONS:  Current Outpatient Medications  Medication Sig Dispense Refill   apixaban  (ELIQUIS ) 5 MG TABS tablet Take 1 tablet (5 mg total) by mouth 2 (two) times daily. 180 tablet 1   Budeson-Glycopyrrol-Formoterol  (BREZTRI  AEROSPHERE) 160-9-4.8 MCG/ACT AERO INHALE 2 PUFFS INTO THE LUNGS TWICE A DAY 10.7 each 1   buPROPion  (WELLBUTRIN  XL) 150 MG 24 hr tablet TAKE 1 TABLET  BY MOUTH EVERY DAY 90 tablet 1   DULoxetine  (CYMBALTA ) 30 MG capsule TAKE 3 CAPSULES BY MOUTH EVERY DAY 270 capsule 1   fentaNYL  (DURAGESIC ) 25 MCG/HR Place 1 patch onto the skin every 3 (three) days. 10 patch 0   gabapentin  (NEURONTIN ) 800 MG tablet TAKE 1 TABLET BY MOUTH THREE TIMES A DAY 270 tablet 1   HYDROcodone -acetaminophen  (NORCO) 5-325 MG tablet  Take 1 tablet by mouth every 6 (six) hours as needed for moderate pain (pain score 4-6). 30 tablet 0   lidocaine -prilocaine  (EMLA ) cream Apply 1 Application topically as needed. 30 g 0   omeprazole  (PRILOSEC) 40 MG capsule TAKE 1 CAPSULE BY MOUTH EVERY DAY BEFORE BREAKFAST 90 capsule 1   ondansetron  (ZOFRAN -ODT) 4 MG disintegrating tablet Take 1 tablet (4 mg total) by mouth every 8 (eight) hours as needed for nausea or vomiting. 20 tablet 0   OXcarbazepine  (TRILEPTAL ) 150 MG tablet Take 1 tablet in the evening.  May increase to taking 1 tablet twice daily. 60 tablet 1   traZODone  (DESYREL ) 100 MG tablet Take 100 mg by mouth at bedtime as needed for sleep.     valACYclovir  (VALTREX ) 500 MG tablet Take 1 tablet (500 mg total) by mouth 2 (two) times daily. 180 tablet 3   No current facility-administered medications for this visit.   REVIEW OF SYSTEMS:  10 Point review of Systems was done is negative except as noted above.   PHYSICAL EXAMINATION:  .BP (!) 150/77 (BP Location: Left Arm, Patient Position: Sitting) Comment: nurse is aware  Pulse 83   Temp (!) 97.5 F (36.4 C) (Temporal)   Resp 16   Ht 5' 5 (1.651 m)   Wt 144 lb (65.3 kg)   SpO2 94%   BMI 23.96 kg/m   GENERAL:alert, in no acute distress and comfortable SKIN: no acute rashes, no significant lesions EYES: conjunctiva are pink and non-injected, sclera anicteric OROPHARYNX: MMM, no exudates, no oropharyngeal erythema or ulceration NECK: supple, no JVD LYMPH:  no palpable lymphadenopathy in the cervical, axillary or inguinal regions LUNGS: clear to auscultation b/l with  normal respiratory effort HEART: regular rate & rhythm ABDOMEN:  normoactive bowel sounds , non tender, not distended. Extremity: no pedal edema PSYCH: alert & oriented x 3 with fluent speech NEURO: no focal motor/sensory deficits   LABORATORY DATA:  I have reviewed the data as listed  .    Latest Ref Rng & Units 09/23/2024   12:30 PM 07/01/2024   12:34 PM 04/10/2024    1:26 PM  CBC EXTENDED  WBC 4.0 - 10.5 K/uL 10.5  9.4  8.4   RBC 3.87 - 5.11 MIL/uL 3.99  4.27  4.37   Hemoglobin 12.0 - 15.0 g/dL 87.5  86.5  86.2   HCT 36.0 - 46.0 % 38.0  41.0  41.6   Platelets 150 - 400 K/uL 331  334  264   NEUT# 1.7 - 7.7 K/uL 7.1  5.8  5.5   Lymph# 0.7 - 4.0 K/uL 2.1  2.3  1.8       Latest Ref Rng & Units 09/23/2024   12:30 PM 07/01/2024   12:34 PM 04/10/2024    1:26 PM  CMP  Glucose 70 - 99 mg/dL 871  888  91   BUN 8 - 23 mg/dL 15  21  25    Creatinine 0.44 - 1.00 mg/dL 9.12  8.96  8.69   Sodium 135 - 145 mmol/L 140  141  139   Potassium 3.5 - 5.1 mmol/L 3.6  4.3  3.9   Chloride 98 - 111 mmol/L 107  109  106   CO2 22 - 32 mmol/L 29  28  25    Calcium  8.9 - 10.3 mg/dL 9.8  9.6  9.6   Total Protein 6.5 - 8.1 g/dL 7.5  7.6  7.9   Total Bilirubin 0.0 - 1.2 mg/dL 0.3  0.3  0.2   Alkaline Phos 38 - 126 U/L 51  61  68   AST 15 - 41 U/L 22  15  42   ALT 0 - 44 U/L 16  9  30          01/02/19 Esophagus Biopsy:    RADIOGRAPHIC STUDIES: .CT Chest Wo Contrast Result Date: 09/06/2024 CLINICAL DATA:  Non-small-cell lung cancer. Restaging. History of esophageal cancer. * Tracking Code: BO * EXAM: CT CHEST WITHOUT CONTRAST TECHNIQUE: Multidetector CT imaging of the chest was performed following the standard protocol without IV contrast. RADIATION DOSE REDUCTION: This exam was performed according to the departmental dose-optimization program which includes automated exposure control, adjustment of the mA and/or kV according to patient size and/or use of iterative reconstruction technique. COMPARISON:   Chest CT 01/30/2024.  PET-CT 04/22/2024 FINDINGS: Cardiovascular: The heart size is normal. No substantial pericardial effusion. Coronary artery calcification is evident. Moderate atherosclerotic calcification is noted in the wall of the thoracic aorta. Right Port-A-Cath tip is positioned in the distal SVC. Mediastinum/Nodes: No mediastinal lymphadenopathy. No evidence for gross hilar lymphadenopathy although assessment is limited by the lack of intravenous contrast on the current study. The esophagus has normal imaging features. There is no axillary lymphadenopathy. Lungs/Pleura: Centrilobular emphsyema noted. The left parahilar nodule seen on previous PET-CT measured approximately 15 x 13 mm and was hypermetabolic at that time. This nodule has essentially resolved with a potential 2-3 mm subtle nodular focus on image 66/6 today. 10 x 9 mm lobular nodule in the left upper lobe (43/6) may represent a cluster of tiny nodules. This is progressive and much more conspicuous than on the previous PET-CT of 04/22/2024 and is new since chest CT of 01/30/2024. No other new suspicious pulmonary nodule or mass. No focal airspace consolidation. No pleural effusion. Upper Abdomen: 10 mm low-density lesion in the dome of the liver seen on 123/2 appears to be new since the prior 2 exams although those studies were performed without intravenous contrast material. 2.1 cm lesion in the medial dome of the liver on 129/2 also appears to be new. Both of these lesions have attenuation higher than would be expected for a cyst. 14 mm low-density lesion in the inferior right liver on 165/2 approaches water  density and was present on the previous studies, likely a benign cyst. No adrenal nodule or mass. Musculoskeletal: No worrisome lytic or sclerotic osseous abnormality. IMPRESSION: 1. The left parahilar nodule seen on previous PET-CT has essentially resolved with a potential 2-3 mm subtle nodular focus at the location on today's study. 2.  10 x 9 mm lobular nodule in the left upper lobe may represent a cluster of tiny nodules. This is progressive and much more conspicuous than on the previous PET-CT of 04/22/2024 and is new since chest CT of 01/30/2024. Potentially infectious/inflammatory, close follow-up warranted as neoplasm is not excluded. 3. 2 low-density lesions in the dome of the liver appear to be new since the prior 2 exams although those studies were performed without intravenous contrast material. Metastatic disease a concern. Contrast infused abdominal CT or MRI of the abdomen with and without contrast recommended to further evaluate. 4. Aortic Atherosclerosis (ICD10-I70.0) and Emphysema (ICD10-J43.9). These results will be called to the ordering clinician or representative by the Radiologist Assistant, and communication documented in the PACS or [employer]. Electronically Signed   By: Camellia Candle M.D.   On: 09/06/2024 05:53    ASSESSMENT & PLAN:   83 y.o. female with  #1 Metastatic  poorly differentiated carcinoma with likely lung primary non-small cell lung cancer.    CT of the head with and without contrast showed no evidence of metastatic disease. EGFR blood test mutation analysis negative. CT chest abdomen pelvis 04/19/2016 shows no evidence of disease progression. Patient tolerated Nivolumab  very well but was discontinued when she developed grade 2 Immune colitis. Has been off Nivolumab  for >6 months  CT chest abdomen pelvis on 06/24/2016 shows no evidence of new disease or progression of metastatic disease. CT chest abdomen pelvis 09/06/2016 shows 1. Mixed interval response to therapy. 2. There is a new left ventral chest wall lesion deep to the pectoralis musculature worrisome for metastatic disease. 3. Posterior lower lobe nodular densities are identified which may reflect areas of pulmonary metastasis. 4. Interval decrease in size of destructive lesion involving the left iliac bone.  CT chest abd pelvis  12/08/2016: Cystic mass involving the left ventral chest wall has resolved in the interval. Likely was a hematoma due to trauma. Interval increase in size of pleural base mass overlying the posterior and inferior left lower lobe. There is also a new left pleural effusion identified.  CT chest 02/01/2017: Residual irregular soft tissue thickening/volume loss and trace left pleural fluid at the base of the left hemithorax, overall improved in appearance from 12/08/2016. No measurable lesion.  CT chest 05/29/2017 shows no residual pleural based mass or significant pleural effusion in the left hemithorax. No evidence of thoracic metastatic disease. No evidence of progressive metastatic disease within the abdomen or pelvis. Mixed lytic and blastic lesion involving the left iliac bone and associated pathologic fracture are unchanged.   CT CAP 09/14/17 shows no new changes. She does have slight displacement of her fractured left iliac bone. Evidence of stable disease.   CT CAP 01/04/2018- No new or progressive metastatic disease. Stable large left iliac bone metastasis with associated chronic pathologic fracture.   CT chest/abd/pelvis done on 04/26/18 revealed Stable exam.  No new or progressive interval findings.  07/19/18 CT C/A/P revealed Stable exam.  No new or progressive interval findings. Large destructive left iliac lesion is similar to prior. Aortic Atherosclerosis and Emphysema.    11/06/18 CT C/A/P revealed Similar appearance of large mixed lytic and sclerotic lesion in the left ilium. No new metastatic lesions are otherwise noted elsewhere in the chest, abdomen or pelvis. 2. Interval development of thickening of the distal third of the esophagus. This is nonspecific, and could be related to underlying reflux esophagitis. However, if there is any clinical concern for Barrett's metaplasia or esophageal neoplasia, further evaluation with nonemergent endoscopy could be considered. 3. Aortic atherosclerosis,  in addition to left main coronary artery disease. Assessment for potential risk factor modification, dietary therapy or pharmacologic therapy may be warranted, if clinically Indicated. 4. Diffuse bronchial wall thickening with mild to moderate centrilobular and paraseptal emphysema; imaging findings suggestive of underlying COPD. 5. Additional incidental findings, as above.  #2  Adenocarcinoma of the Esophagus  Barrett's esophagus 4cms in the distal esophagus with low and high-grade dysplasia  01/02/19 Surgical pathology revealed adenocarcinoma of the esophagus   01/25/19 PET/CT revealed Distal esophageal primary, without hypermetabolic metastatic disease. 2. Chronic left iliac metastasis, as before. 3. Hypermetabolism within and superficial to the right gluteal musculature is most likely related to trauma and/or injection sites. 4. Aortic atherosclerosis, coronary artery atherosclerosis and emphysema.  S/p concurrent Carboplatin  and Taxol  weekly with RT of 45 Gy in 25 fractions and 5.4 Gy boost, completed between 02/04/19 and 03/27/19  07/03/2019 PET skull base to thigh revealed 1. Interval response to therapy. Significant reduction in FDG uptake associated with distal esophageal mass. SUV max currently 2.61 versus 16.97 previously. 2. 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 Unchanged 3. Aortic Atherosclerosis (ICD10-I70.0) and Emphysema (ICD10-J43.9). Coronary artery calcifications.  07/15/2020 CT C/A/P ([id number]) ([id number]) revealed 1. No evidence of new or progressive metastatic disease in the chest, abdomen or pelvis.  #3 diarrhea-  now resolved was previously. 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 She was previously on on Lialda , budesonide ,probiotics and lomotil  but not currently taking any of these. Plan -Continue Sandostatin  every 4 weeks    #4 h/o DVT and PE  -continue on Xarelto  - no issues with bleeding   #5 history of COPD management per primary care physician  #6 hx of severe shingles of the forehead with postherpetic neuralgia  PLAN: - Discussed lab results on 09/23/2024 in detail with patient: CBC normal with WBC of 10.5K, Hemoglobin of 12.4, PLTs of 331K, and Neutrophils of 7K. CMP stable.  Reviewed  09/02/2024 CT chest wo contrast with patient:  1. The left parahilar nodule seen on previous PET-CT has essentially resolved with a potential 2-3 mm subtle nodular focus at the location on today's study.  2. 10 x 9 mm lobular nodule in the left upper lobe may represent a cluster of tiny nodules. This is progressive and much more conspicuous than on the previous PET-CT of 04/22/2024 and is new since chest CT of 01/30/2024.  Discussed this may be potentially infectious/inflammatory, with MRI recommended.  Currently patient does not have any signs of progression of her lung or esophageal cancer. 3. 2 low-density lesions in the dome of the liver appear to be new since the prior 2 exams although those studies were performed without intravenous contrast material. Metastatic disease a concern.  MRI of the abdomen with and without contrast recommended to further evaluate.  4. Aortic Atherosclerosis (ICD10-I70.0) and Emphysema (ICD10-J43.9).  - Is on multiple sedatives, would prefer for her PCP to manage polypharmacy  -f/u  With [facility] for interventional pain management to address postherpetic neuralgia discomfort over the right eye.  - Counseled patient to refrain from driving until she is cleared by Optometry/Ophthalmology  Rwommeded not to drive.  - continue Xgeva  q12 weeks. No new dental issues.  Scheduled for today   FOLLOW-UP: CT chest w/ contrast in 5 weeks MRI Abd w and w/o contrast in 4-5 weeks RTC with Dr Onesimo with labs in 6 weeks  The total time spent in the appointment was 32 minutes* .  All of the patient's  questions were answered with apparent satisfaction. The patient knows to call the clinic with any problems, questions or concerns.   Emaline Onesimo MD MS AAHIVMS [facility] Hematology/Oncology Physician [facility]  .*Total Encounter Time as defined by the Centers for Medicare and Medicaid Services includes, in addition to the face-to-face time of a patient visit (documented in the note above) non-face-to-face time: obtaining and reviewing outside history, ordering and reviewing medications, tests or procedures, care coordination (communications with other health care professionals or caregivers) and documentation in the medical record.   LILLETTE Perkins Lagle,acting as a scribe for Emaline Onesimo, MD.,have documented all relevant documentation on the behalf of Emaline Onesimo, MD,as directed by  Emaline Onesimo, MD while in the presence of Emaline Onesimo, MD.  I have reviewed the above documentation for accuracy and completeness, and I agree with the above. Emaline Candida Saran MD.

## 2024-09-24 ENCOUNTER — Inpatient Hospital Stay: Payer: Self-pay

## 2024-09-24 ENCOUNTER — Ambulatory Visit: Admitting: Hematology

## 2024-09-24 ENCOUNTER — Other Ambulatory Visit

## 2024-09-24 ENCOUNTER — Telehealth: Payer: Self-pay | Admitting: Hematology

## 2024-09-24 NOTE — Telephone Encounter (Signed)
 Scheduled patient for labs and follow-up. Called and spoke with the patient, she is aware.

## 2024-09-25 ENCOUNTER — Inpatient Hospital Stay: Payer: Self-pay

## 2024-09-29 ENCOUNTER — Encounter: Payer: Self-pay | Admitting: Hematology

## 2024-10-14 ENCOUNTER — Ambulatory Visit (INDEPENDENT_AMBULATORY_CARE_PROVIDER_SITE_OTHER): Admitting: Family Medicine

## 2024-10-14 ENCOUNTER — Encounter: Payer: Self-pay | Admitting: Family Medicine

## 2024-10-14 VITALS — BP 174/63 | HR 85 | Ht 65.0 in | Wt 142.0 lb

## 2024-10-14 DIAGNOSIS — G4700 Insomnia, unspecified: Secondary | ICD-10-CM | POA: Diagnosis not present

## 2024-10-14 DIAGNOSIS — B0229 Other postherpetic nervous system involvement: Secondary | ICD-10-CM | POA: Diagnosis not present

## 2024-10-14 DIAGNOSIS — Z23 Encounter for immunization: Secondary | ICD-10-CM | POA: Diagnosis not present

## 2024-10-14 DIAGNOSIS — J432 Centrilobular emphysema: Secondary | ICD-10-CM | POA: Diagnosis not present

## 2024-10-14 DIAGNOSIS — B0230 Zoster ocular disease, unspecified: Secondary | ICD-10-CM

## 2024-10-14 DIAGNOSIS — C7951 Secondary malignant neoplasm of bone: Secondary | ICD-10-CM

## 2024-10-14 MED ORDER — BREZTRI AEROSPHERE 160-9-4.8 MCG/ACT IN AERO
INHALATION_SPRAY | RESPIRATORY_TRACT | 11 refills | Status: AC
Start: 2024-10-14 — End: ?

## 2024-10-14 MED ORDER — DOXEPIN HCL 10 MG PO CAPS: 10.0000 mg | ORAL_CAPSULE | Freq: Every day | ORAL | 2 refills | Status: DC

## 2024-10-14 NOTE — Patient Instructions (Addendum)
 It was nice to see you today,  We addressed the following topics today: - [place] [facility] at [facility] at [phone]  - the other doctor who may help is Toribio Fairy Badder, MD [address].  If you call his office I would like you to specifically ask if they do pulse radiofrequency treatments for post herpetic neuralgia involving the eye.   - I am sending a referral to the interventional pain management doctors at [facility]. Their office should call you to schedule, but you can call them if you do not hear back within a couple of weeks. - I have written down the name of the doctor at [facility] and the procedure pulsed radiofrequency ablation. You can call that office to see if they can help if the doctors at [place] cannot. - Please try the new sleep medication, doxepin. Take one 10mg  tablet about an hour before you want to go to bed. If this does not work, let me know, and we can discuss other options. - I have sent the prescriptions for doxepin and a new Breztri  inhaler to [facility]. - Please follow up here in two months to check on your progress.  Have a great day,  Rolan Slain, MD

## 2024-10-14 NOTE — Progress Notes (Unsigned)
 Established Patient Office Visit  Subjective   Patient ID: Cindy Byrd, female    DOB: 10-Jun-1941  Age: 83 y.o. MRN: [id number]  Chief Complaint  Patient presents with   Medical Management of Chronic Issues    HPI  Subjective - Follow-up for post-herpetic neuralgia eye pain. - Reports a new, unnamed medication was trialed since last visit but was ineffective and caused side effects of feeling loopy and crazy. This medication was discontinued. - Resumed gabapentin  800 mg three times daily. Reports this works better for the pain. Pregabalin  was tried in the past and was less effective than gabapentin . - Reports a prior interventional pain management procedure at [facility] was unsuccessful and painful. The injection seemed to affect the V2 distribution (upper teeth/gums) rather than the V1 distribution (eye/forehead) where the pain is located. Unwilling to return to [facility] for further procedures. - Eye pain continues. Describes pain as severe at times, particularly with changes in temperature and wind. Reports decreased vision in the affected eye, which is cloudy with stringy, purulent-like discharge. - Has considered enucleation but was told by the ophthalmologist that it is a very painful procedure and may not resolve the pain since the nerve innervates more than just the eyeball. - Concerned about driver's license renewal due to vision loss in one eye. Has an appointment with an eye doctor regarding this.  - Follow-up for insomnia. - Reports trazodone  is no longer effective. It allows for about 4 hours of sleep, after which it is impossible to fall back asleep. - Has not tried other sleep medications.  - Follow-up for cancer. - Reports recurrence of cancer with a new lung lesion, a cluster in the lung, and spots on the liver. - States age is 83 and does not want chemotherapy due to the tax on the body. Would consider radiation. Has previously had immunotherapy (Opdivo ) and would be  willing to try that again. Awaiting discussion with oncologist regarding treatment options.  - Follow-up for Breztri . - Reports a previous prescription for Breztri  was too expensive. Is willing to try it again.  Medications Current medications include Gabapentin  800mg  TID, trazodone  for sleep, Breztri  inhaler, oxygen  as needed, and steroid eye drops twice daily.  PMH, PSH, FH, Social Hx PMHx: Post-herpetic neuralgia of the trigeminal nerve (V1 branch) status post shingles in 2021. History of cancer since 2017 with recurrence in lung and liver. PSH: History of skin grafting on the forehead and above the eye. Social Hx: Lives with a friend.  ROS Pertinent positives: right eye pain, decreased vision, eye discharge, insomnia, concern about cancer recurrence. Pertinent negatives: denies new issues since last visit.  Objective General: Alert and oriented. Appears stated age. In no acute distress.  Assessment and Plan Post-herpetic neuralgia (trigeminal neuralgia): 83 year old with chronic, severe right-sided eye pain (V1 distribution) secondary to shingles in 2021. Has failed multiple oral medications, including a recent trial of a new agent that caused side effects. Currently managed on gabapentin  800mg  TID with partial relief. A prior nerve block procedure at [facility] was unsuccessful. Pain is exacerbated by environmental factors like wind and cold. - Refer to interventional pain management at [facility] for evaluation and consideration of other treatment options. - Provide information for pain management at [facility] that performs pulsed radiofrequency ablation as another potential option if the [place] group has no new offerings. - Continue gabapentin  800mg  TID. - Continue steroid eye drops as prescribed by ophthalmology. - Follow up with ophthalmology on 11/05/2024.  Insomnia:  Reports trazodone  is no longer effective for sleep maintenance. - Discontinue trazodone . - Start  doxepin 10mg , take one tablet one hour before bedtime. [facility]. - Discussed risks/benefits of zolpidem  (Ambien ) in older adults. Will consider a low dose of a long-acting agent like Ambien  CR or eszopiclone (Lunesta) if doxepin is ineffective.  Recurrent Metastatic Cancer: History of cancer since 2017. Now with new lesions in the lung and liver. Does not desire chemotherapy but is open to radiation and immunotherapy. - Continue to follow with oncology for management.  COPD: Using Breztri  inhaler. - [facility]. - Counseled on Medicare Part D deductible ($2100 for 2025) and option to arrange monthly payments with the insurance company.   The ASCVD Risk score (Arnett DK, et al., 2019) failed to calculate for the following reasons:   The 2019 ASCVD risk score is only valid for ages 32 to 74  Health Maintenance Due  Topic Date Due   DTaP/Tdap/Td (1 - Tdap) Never done   Pneumococcal Vaccine: 50+ Years (1 of 2 - PCV) Never done   COVID-19 Vaccine (3 - Pfizer risk series) 11/03/2020   Zoster Vaccines- Shingrix (2 of 2) 03/20/2023   Medicare Annual Wellness (AWV)  03/27/2024      Objective:     BP (!) 146/73   Pulse 85   Ht 5' 5 (1.651 m)   Wt 142 lb (64.4 kg)   SpO2 95%   BMI 23.63 kg/m  {Vitals History (Optional):23777}  Physical Exam   No results found for any visits on 10/14/24.      Assessment & Plan:   Centrilobular emphysema (HCC)     No follow-ups on file.    Toribio MARLA Slain, MD

## 2024-10-15 NOTE — Assessment & Plan Note (Signed)
 Post-herpetic neuralgia (trigeminal neuralgia): 83 year old with chronic, severe right-sided eye pain (V1 distribution) secondary to shingles in 2021. Has failed multiple oral medications, including a recent trial of a new agent that caused side effects. Currently managed on gabapentin  800mg  TID with partial relief. A prior nerve block procedure at [facility] was unsuccessful. Pain is exacerbated by environmental factors like wind and cold. - Refer to interventional pain management at [facility] for evaluation and consideration of other treatment options. - Provide information for pain management at [facility] that performs pulsed radiofrequency as another potential option if the [place] referral does not yield improvement. - Continue gabapentin  800mg  TID. - Continue steroid eye drops as prescribed by ophthalmology. - Follow up with ophthalmology on 11/05/2024.

## 2024-10-15 NOTE — Assessment & Plan Note (Signed)
-   Send new [facility] for Bre[facility]tri  [facility]. - Counseled on Medicare Part D deductible ($2100 for 2025) and option to arrange monthly payments with the insurance company.

## 2024-10-15 NOTE — Assessment & Plan Note (Signed)
 Reports trazodone  is no longer effective for sleep maintenance. - Discontinue trazodone . - Start doxepin 10mg , take one tablet one hour before bedtime. [facility] to [facility]. - Discussed risks/benefits of zolpidem  (Ambien ) in older adults. Will consider a low dose of a long-acting agent like Ambien  CR or eszopiclone (Lunesta) if doxepin is ineffective.

## 2024-10-15 NOTE — Assessment & Plan Note (Signed)
 Follows with oncology.  Currently receiving denosumab  q12w.  Has upcoming mri and ct for further evaluation of new liver and pulm lesions seen on recent imaging.

## 2024-10-28 ENCOUNTER — Ambulatory Visit (HOSPITAL_COMMUNITY)
Admission: RE | Admit: 2024-10-28 | Discharge: 2024-10-28 | Disposition: A | Discharge status: Home / Self Care | Source: Ambulatory Visit | Attending: Hematology | Admitting: Hematology

## 2024-10-28 ENCOUNTER — Encounter (HOSPITAL_COMMUNITY): Payer: Self-pay

## 2024-10-28 DIAGNOSIS — C3491 Malignant neoplasm of unspecified part of right bronchus or lung: Secondary | ICD-10-CM

## 2024-10-28 DIAGNOSIS — C155 Malignant neoplasm of lower third of esophagus: Secondary | ICD-10-CM

## 2024-10-28 MED ORDER — IOHEXOL 300 MG/ML  SOLN
75.0000 mL | Freq: Once | INTRAMUSCULAR | Status: AC | PRN
  Administered 2024-10-28: 75 mL via INTRAVENOUS

## 2024-10-28 MED ORDER — GADOBUTROL 1 MMOL/ML IV SOLN
6.0000 mL | Freq: Once | INTRAVENOUS | Status: AC | PRN
  Administered 2024-10-28: 6 mL via INTRAVENOUS

## 2024-11-04 ENCOUNTER — Other Ambulatory Visit: Payer: Self-pay

## 2024-11-04 DIAGNOSIS — C3491 Malignant neoplasm of unspecified part of right bronchus or lung: Secondary | ICD-10-CM

## 2024-11-05 ENCOUNTER — Inpatient Hospital Stay: Attending: Hematology

## 2024-11-05 ENCOUNTER — Inpatient Hospital Stay: Admitting: Hematology

## 2024-11-05 DIAGNOSIS — C7951 Secondary malignant neoplasm of bone: Secondary | ICD-10-CM | POA: Diagnosis present

## 2024-11-05 DIAGNOSIS — C3491 Malignant neoplasm of unspecified part of right bronchus or lung: Secondary | ICD-10-CM

## 2024-11-05 DIAGNOSIS — C3490 Malignant neoplasm of unspecified part of unspecified bronchus or lung: Secondary | ICD-10-CM | POA: Diagnosis present

## 2024-11-05 DIAGNOSIS — C787 Secondary malignant neoplasm of liver and intrahepatic bile duct: Secondary | ICD-10-CM

## 2024-11-05 DIAGNOSIS — C155 Malignant neoplasm of lower third of esophagus: Secondary | ICD-10-CM

## 2024-11-05 LAB — CBC WITH DIFFERENTIAL (CANCER CENTER ONLY)
Abs Immature Granulocytes: 0.03 K/uL (ref 0.00–0.07)
Basophils Absolute: 0.1 K/uL (ref 0.0–0.1)
Basophils Relative: 1 %
Eosinophils Absolute: 0.2 K/uL (ref 0.0–0.5)
Eosinophils Relative: 2 %
HCT: 39.8 % (ref 36.0–46.0)
Hemoglobin: 13.2 g/dL (ref 12.0–15.0)
Immature Granulocytes: 0 %
Lymphocytes Relative: 16 %
Lymphs Abs: 1.7 K/uL (ref 0.7–4.0)
MCH: 31.4 pg (ref 26.0–34.0)
MCHC: 33.2 g/dL (ref 30.0–36.0)
MCV: 94.8 fL (ref 80.0–100.0)
Monocytes Absolute: 0.7 K/uL (ref 0.1–1.0)
Monocytes Relative: 7 %
Neutro Abs: 7.6 K/uL (ref 1.7–7.7)
Neutrophils Relative %: 74 %
Platelet Count: 341 K/uL (ref 150–400)
RBC: 4.2 MIL/uL (ref 3.87–5.11)
RDW: 14.5 % (ref 11.5–15.5)
WBC Count: 10.3 K/uL (ref 4.0–10.5)
nRBC: 0 % (ref 0.0–0.2)

## 2024-11-05 LAB — CMP (CANCER CENTER ONLY)
ALT: 21 U/L (ref 0–44)
AST: 23 U/L (ref 15–41)
Albumin: 3.7 g/dL (ref 3.5–5.0)
Alkaline Phosphatase: 80 U/L (ref 38–126)
Anion gap: 12 (ref 5–15)
BUN: 13 mg/dL (ref 8–23)
CO2: 24 mmol/L (ref 22–32)
Calcium: 9.3 mg/dL (ref 8.9–10.3)
Chloride: 103 mmol/L (ref 98–111)
Creatinine: 1 mg/dL (ref 0.44–1.00)
GFR, Estimated: 55 mL/min — ABNORMAL LOW (ref >60)
Glucose, Bld: 207 mg/dL — ABNORMAL HIGH (ref 70–99)
Potassium: 3.4 mmol/L — ABNORMAL LOW (ref 3.5–5.1)
Sodium: 139 mmol/L (ref 135–145)
Total Bilirubin: 0.3 mg/dL (ref 0.0–1.2)
Total Protein: 7.8 g/dL (ref 6.5–8.1)

## 2024-11-06 ENCOUNTER — Other Ambulatory Visit: Payer: Self-pay | Admitting: Family Medicine

## 2024-11-10 NOTE — Progress Notes (Signed)
 HEMATOLOGY ONCOLOGY PROGRESS NOTE  Date of service: 11/05/2024  Patient Care Team: Chandra Toribio POUR, MD as PCP - General (Family Medicine) Byrd Cindy Brink, MD as Consulting Physician (Hematology and Oncology)  CHIEF COMPLAINT/PURPOSE OF CONSULTATION: Follow-up for continued evaluation and management of Metastatic Lung Cancer; Esophageal Cancer   DIAGNOSIS:    #1 Metastatic non-small cell lung cancer with bilateral lung nodules and large metastatic lesion in the left Ilium. 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 Also had c diff colitis - treated HISTORY OF PRESENTING ILLNESS:  (06/24/2015) Cindy Byrd is a pleasant 83 year old Caucasian female with below mentioned past medical history including hypertension, irritable bowel syndrome, heavy cigarette smoking more than 100 pack years, family history of colon cancer in her father and 2 brothers and breast cancer in her daughter has been referred to us  in consultation by her primary care physician Dr. Lynwood Ly for evaluation and management of her newly noted left ilium mass.   Patient was apparently in her usual state of health until the first of this year when she noted some left hip/groin pain. At first it was mild and was not bothersome she was able to motor along. Over the last month or so the pain started getting progressively worse. She notes that she had a couple of left hip steroid shots by her primary care physician without any alleviation of her pain. She notes that she subsequently had a CT of the abdomen and pelvis done on 06/12/2015 which revealed an aggressive looking left ilium 9 cm mass with infiltration of the iliacus and gluteal muscles causing significant pain and some weakness in her left lower extremity. She feels that the left lower extremity is unstable and does not feel comfortable bearing weight on it and taking the right foot of the ground.   She notes  that she was started on oxycodone  IR 5 mg every 6 hours as needed for pain which is only somewhat controlling her pain. She notes significant anorexia that has been progressive and has resulted in about a 40 pound weight loss since earlier this year. She notes no overt nausea. Does have chronic diarrhea which is related to her IBS but is better since being on narcotics.   She notes that she has not been taking her hydrochlorothiazide or blood pressure. Feels like she is eating poorly. Her blood pressure was in the high 80s systolic today without any dizziness or orthostasis with normal mentation. This is likely due to her lack of needing antihypertensives due to her significant weight loss.   She also notes feeling depressed and not sleeping well as a function of anxiety as well as untreated pain. She is open to medications to help with optimizing her pain control and improving her appetite.   She is anxious to reach a diagnosis as soon as possible and start treating this. She mentions he has a wonderful family who has been very supportive and she intends to fight to beat her illness so she can spend more time with her family.   SUMMARY OF ONCOLOGIC HISTORY:  Previous Treatment   For Metastatic Lung Cancer 1 Palliative radiation therapy to the large left ilium metastases 2. IV Nivolumab  x 20 cycles (discontinued due to likely immune colitis) 3. Xgeva  120mg   q4weeks for bone metastases.   For Esophageal Adenocarcinoma S/p Concurrent carbo/taxol  + RT   Oncology History  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)  01/14/2019 Initial  Diagnosis   Adenocarcinoma of cardio-esophageal junction (HCC)   01/21/2019 - 03/20/2019 Chemotherapy   The patient had dexamethasone  (DECADRON ) 4 MG tablet, 8 mg, Oral, Daily, 1 of 1 cycle, Start date: 01/14/2019, End date: 04/03/2019 palonosetron  (ALOXI ) injection 0.25 mg, 0.25 mg, Intravenous,  Once, 1 of 1 cycle Administration: 0.25 mg (01/21/2019),  0.25 mg (02/12/2019), 0.25 mg (02/19/2019), 0.25 mg (03/13/2019), 0.25 mg (03/20/2019) CARBOplatin  (PARAPLATIN ) 140 mg in sodium chloride  0.9 % 250 mL chemo infusion, 140 mg (107.3 % of original dose 126 mg), Intravenous,  Once, 1 of 1 cycle Dose modification:   (original dose 126 mg, Cycle 1) Administration: 140 mg (01/21/2019), 140 mg (02/12/2019), 150 mg (02/19/2019), 130 mg (03/13/2019), 150 mg (03/20/2019) PACLitaxel  (TAXOL ) 90 mg in sodium chloride  0.9 % 250 mL chemo infusion (</= 80mg /m2), 50 mg/m2 = 90 mg, Intravenous,  Once, 1 of 1 cycle Administration: 90 mg (01/21/2019), 90 mg (02/12/2019), 90 mg (02/19/2019), 90 mg (03/13/2019), 90 mg (03/20/2019)  for chemotherapy treatment.    Current Treatment   1) Active surveillance 2) Xgeva  120mg  Pyote q12weeks for bone metastases. 3) Sandostatin  q4weeks for diarrhea  INTERVAL HISTORY: Cindy Byrd is a 83 y.o. female who is here today for continued evaluation and management of Metastatic Lung Cancer; Esophageal Cancer.   she was last seen by me on 09/23/2024; at the time she mentioned experiencing persisting discomfort from postherpetic neuralgia above her right eye and was awaiting a nerve ablation treatment at [facility], sleep interruptions, and a new productive cough with white-yellow mucus.   Today, she        REVIEW OF SYSTEMS:   10 Point review of systems of done and is negative except as noted above.  MEDICAL HISTORY Past Medical History:  Diagnosis Date   Barrett's esophagus    Bilateral pulmonary embolism (HCC) 09/02/2016   09/02/16 bilateral pulmonary emboli in context of extensive bilateral lower extremity deep venous thromboses Assumed hypercoagulability due to non-small cell metastatic lung cancer Lifelong anticoagulation recommended   Bone neoplasm 06/24/2015   Cancer (HCC)    metastatic poorly differentiated carcinoma. tumor left groin surgical removal with radiation tx.   Cataract    BILATERAL   Cigarette smoker two packs a day or  less    Currently still smoking 2 PPD - Not interested in quitting at this time.   Colitis 2017   Colon polyps    hyperplastic, tubular adenomas, tubulovillous adenoma   Cough, persistent    hx. lung cancer ? primary-being evaluated, unsure of primary site.   Depression 06/24/2015   Diverticulosis    DVT of lower extremity, bilateral (HCC) 09/02/2016   Dysrhythmia    PAF in setting of submassive PE 09/2016   Emphysema of lung (HCC)    Endometriosis    Hysterectomy with BSO at age 8 yrs   Esophageal adenocarcinoma (HCC) 08/11/2015   intramucosal   Gastritis    GERD (gastroesophageal reflux disease)    H/O: pneumonia    Hiatal hernia    Hyperglycemia    A1c 7.4% 06/20/22   Hyperlipidemia    Hypertension 06/24/2015   likely improved incidental to 40 lbs weight loss from her neoplasm. No Longer taking med for this as of 08-06-15   IBS (irritable bowel syndrome)    Lung cancer (HCC)    suspected IV lung cancer with left ileum mets 2017, s/p left ileum radiaiton, immunotherapy   Pain    left hip-persistenttumor of bone-radiation tx. 10.   Pneumonia  Vitamin D  deficiency disease     SURGICAL HISTORY Past Surgical History:  Procedure Laterality Date   ABDOMINAL HYSTERECTOMY     BALLOON DILATION N/A 10/08/2019   Procedure: BALLOON DILATION;  Surgeon: Albertus Gordy HERO, MD;  Location: [facility];  Service: Gastroenterology;  Laterality: N/A;   BARTHOLIN GLAND CYST EXCISION  83 yo ago   Does not want if it was an infected cyst or tumor. Was soon as delivery   BIOPSY  01/02/2019   Procedure: BIOPSY;  Surgeon: Albertus Gordy HERO, MD;  Location: [facility];  Service: Gastroenterology;;   CATARACT EXTRACTION     COLONOSCOPY W/ POLYPECTOMY     multiple times - last done 09/2014 per patient.   ESOPHAGOGASTRODUODENOSCOPY (EGD) WITH PROPOFOL  N/A 08/11/2015   Procedure: ESOPHAGOGASTRODUODENOSCOPY (EGD) WITH PROPOFOL ;  Surgeon: Gordy HERO Albertus, MD;  Location: [facility];  Service:  Gastroenterology;  Laterality: N/A;   ESOPHAGOGASTRODUODENOSCOPY (EGD) WITH PROPOFOL  N/A 01/02/2019   Procedure: ESOPHAGOGASTRODUODENOSCOPY (EGD) WITH PROPOFOL ;  Surgeon: Albertus Gordy HERO, MD;  Location: [facility];  Service: Gastroenterology;  Laterality: N/A;   ESOPHAGOGASTRODUODENOSCOPY (EGD) WITH PROPOFOL  N/A 10/08/2019   Procedure: ESOPHAGOGASTRODUODENOSCOPY (EGD) WITH PROPOFOL ;  Surgeon: Albertus Gordy HERO, MD;  Location: [facility];  Service: Gastroenterology;  Laterality: N/A;   EYE SURGERY Right 2024   Multiple surgeries over the past year.   FLEXIBLE SIGMOIDOSCOPY N/A 06/24/2017   Procedure: FLEXIBLE SIGMOIDOSCOPY;  Surgeon: Leigh Elspeth Mt, MD;  Location: [facility];  Service: Gastroenterology;  Laterality: N/A;   GANGLION CYST EXCISION     KNEE ARTHROSCOPY  age about 71 yrs   TONSILLECTOMY     TOTAL ABDOMINAL HYSTERECTOMY W/ BILATERAL SALPINGOOPHORECTOMY  at age 46 yrs   For endometriosis   VIDEO BRONCHOSCOPY WITH ENDOBRONCHIAL ULTRASOUND Bilateral 06/06/2023   Procedure: VIDEO BRONCHOSCOPY WITH ENDOBRONCHIAL ULTRASOUND;  Surgeon: Brenna Adine CROME, DO;  Location: [facility];  Service: Cardiopulmonary;  Laterality: Bilateral;    SOCIAL HISTORY Social History   Tobacco Use   Smoking status: Former    Current packs/day: 0.00    Average packs/day: 1 pack/day for 60.0 years (60.0 ttl pk-yrs)    Types: Cigarettes    Start date: 12/05/1954    Quit date: 12/05/2014    Years since quitting: 9.9    Passive exposure: Never   Smokeless tobacco: Never  Vaping Use   Vaping status: Never Used  Substance Use Topics   Alcohol use: No    Alcohol/week: 0.0 standard drinks of alcohol   Drug use: No    Social History   Social History Narrative   Not on file    SOCIAL DRIVERS OF HEALTH SDOH Screenings   Food Insecurity: No Food Insecurity (05/14/2024)  Housing: Low Risk  (05/14/2024)  Transportation Needs: No Transportation Needs (05/14/2024)  Utilities: Not At Risk  (05/14/2024)  Alcohol Screen: Low Risk  (03/28/2023)  Depression (PHQ2-9): Low Risk  (10/14/2024)  Financial Resource Strain: Low Risk  (03/28/2023)  Physical Activity: Inactive (03/28/2023)  Social Connections: Socially Isolated (01/31/2024)  Stress: No Stress Concern Present (03/28/2023)  Tobacco Use: Medium Risk (10/14/2024)     FAMILY HISTORY Family History  Problem Relation Age of Onset   Colon cancer Brother    Colon cancer Brother    Stroke Mother    Colon cancer Father    Emphysema Father        smoked   Breast cancer Daughter 89       ER/PR+ stage II     ALLERGIES: is allergic to  penicillins, remeron  [mirtazapine ], and latex.  MEDICATIONS  Current Outpatient Medications  Medication Sig Dispense Refill   apixaban  (ELIQUIS ) 5 MG TABS tablet Take 1 tablet (5 mg total) by mouth 2 (two) times daily. 180 tablet 1   budesonide -glycopyrrolate-formoterol  (BREZTRI  AEROSPHERE) 160-9-4.8 MCG/ACT AERO inhaler INHALE 2 PUFFS INTO THE LUNGS TWICE A DAY 10.7 each 11   buPROPion  (WELLBUTRIN  XL) 150 MG 24 hr tablet TAKE 1 TABLET BY MOUTH EVERY DAY 90 tablet 1   doxepin  (SINEQUAN ) 10 MG capsule TAKE 1 CAPSULE BY MOUTH AT BEDTIME. 90 capsule 1   DULoxetine  (CYMBALTA ) 30 MG capsule TAKE 3 CAPSULES BY MOUTH EVERY DAY 270 capsule 1   fentaNYL  (DURAGESIC ) 25 MCG/HR Place 1 patch onto the skin every 3 (three) days. 10 patch 0   gabapentin  (NEURONTIN ) 800 MG tablet TAKE 1 TABLET BY MOUTH THREE TIMES A DAY 270 tablet 1   HYDROcodone -acetaminophen  (NORCO) 5-325 MG tablet Take 1 tablet by mouth every 6 (six) hours as needed for moderate pain (pain score 4-6). 30 tablet 0   lidocaine -prilocaine  (EMLA ) cream Apply 1 Application topically as needed. 30 g 0   omeprazole  (PRILOSEC) 40 MG capsule TAKE 1 CAPSULE BY MOUTH EVERY DAY BEFORE BREAKFAST 90 capsule 1   ondansetron  (ZOFRAN -ODT) 4 MG disintegrating tablet Take 1 tablet (4 mg total) by mouth every 8 (eight) hours as needed for nausea or vomiting. 20  tablet 0   valACYclovir  (VALTREX ) 500 MG tablet Take 1 tablet (500 mg total) by mouth 2 (two) times daily. 180 tablet 3   No current facility-administered medications for this visit.    PHYSICAL EXAMINATION: ECOG PERFORMANCE STATUS: {CHL ONC ECOG ED:[id number]} VITALS: There were no vitals filed for this visit. There were no vitals filed for this visit. There is no height or weight on file to calculate BMI.  GENERAL: alert, in no acute distress and comfortable SKIN: no acute rashes, no significant lesions EYES: conjunctiva are pink and non-injected, sclera anicteric OROPHARYNX: MMM, no exudates, no oropharyngeal erythema or ulceration NECK: supple, no JVD LYMPH:  no palpable lymphadenopathy in the cervical, axillary or inguinal regions LUNGS: clear to auscultation b/l with normal respiratory effort HEART: regular rate & rhythm ABDOMEN:  normoactive bowel sounds , non tender, not distended, no hepatosplenomegaly Extremity: no pedal edema PSYCH: alert & oriented x 3 with fluent speech NEURO: no focal motor/sensory deficits  LABORATORY DATA:   I have reviewed the data as listed     Latest Ref Rng & Units 11/05/2024   11:53 AM 09/23/2024   12:30 PM 07/01/2024   12:34 PM  CBC EXTENDED  WBC 4.0 - 10.5 K/uL 10.3  10.5  9.4   RBC 3.87 - 5.11 MIL/uL 4.20  3.99  4.27   Hemoglobin 12.0 - 15.0 g/dL 86.7  87.5  86.5   HCT 36.0 - 46.0 % 39.8  38.0  41.0   Platelets 150 - 400 K/uL 341  331  334   NEUT# 1.7 - 7.7 K/uL 7.6  7.1  5.8   Lymph# 0.7 - 4.0 K/uL 1.7  2.1  2.3       Latest Ref Rng & Units 11/05/2024   11:53 AM 09/23/2024   12:30 PM 07/01/2024   12:34 PM  CMP  Glucose 70 - 99 mg/dL 792  871  888   BUN 8 - 23 mg/dL 13  15  21    Creatinine 0.44 - 1.00 mg/dL 8.99  9.12  8.96   Sodium 135 - 145 mmol/L 139  140  141   Potassium 3.5 - 5.1 mmol/L 3.4  3.6  4.3   Chloride 98 - 111 mmol/L 103  107  109   CO2 22 - 32 mmol/L 24  29  28    Calcium  8.9 - 10.3 mg/dL 9.3  9.8  9.6    Total Protein 6.5 - 8.1 g/dL 7.8  7.5  7.6   Total Bilirubin 0.0 - 1.2 mg/dL 0.3  0.3  0.3   Alkaline Phos 38 - 126 U/L 80  51  61   AST 15 - 41 U/L 23  22  15    ALT 0 - 44 U/L 21  16  9         06/27/2017 SURGICAL PATHOLOGY      01/02/19 Esophagus Biopsy:     RADIOGRAPHIC STUDIES: I have personally reviewed the radiological images as listed and agreed with the findings in the report. CT Chest W Contrast Result Date: 11/01/2024 CLINICAL DATA:  Metastatic lung cancer to liver and esophagus status post chemotherapy, radiation, and immunotherapy. Chronic shortness of breath. * Tracking Code: BO * EXAM: CT CHEST WITH CONTRAST TECHNIQUE: Multidetector CT imaging of the chest was performed during intravenous contrast administration. RADIATION DOSE REDUCTION: This exam was performed according to the departmental dose-optimization program which includes automated exposure control, adjustment of the mA and/or kV according to patient size and/or use of iterative reconstruction technique. CONTRAST:  75mL OMNIPAQUE  IOHEXOL  300 MG/ML  SOLN COMPARISON:  CT chest dated 09/02/2024 FINDINGS: Cardiovascular: Right chest wall port terminates in the lower SVC. Normal heart size. No significant pericardial fluid/thickening. Great vessels are normal in course and caliber. Chronically attenuated appearance of left upper lobe pulmonary arteries, likely sequela of chronic thrombus. No acute central pulmonary emboli. Coronary artery calcifications and aortic atherosclerosis including irregular plaque at the aortic arch. Mediastinum/Nodes: Imaged thyroid  gland without nodules meeting criteria for imaging follow-up by size. Normal esophagus. No pathologically enlarged axillary, supraclavicular, mediastinal, or hilar lymph nodes. Lungs/Pleura: The central airways are patent. Moderate centrilobular and paraseptal emphysema. Scattered peripheral linear atelectasis/scarring within the posterior bilateral lower lobes and anterior  bilateral upper lobes. Interval increased density of 9 x 9 mm peripheral left upper lobe nodule (12:35), which is unchanged in size compared to 09/02/2024. Unchanged 3 mm previously hypermetabolic left upper lobe peribronchovascular nodule (12:60). Scattered subsegmental mucous plugging. Additional irregular nodules are unchanged, for example posterior right upper lobe (12:43 and posterior bilateral lower lobes (12:82). No pneumothorax. No pleural effusion. Upper abdomen: Please see separately dictated MRI abdomen report for detailed findings. Interval increase in size of several hepatic lesions: -2.1 cm superior segment 8 lesion (6:117), previously 1.0 cm -1.5 cm peripheral segment 3 (6:153), previously 1.1 cm 2.2 cm medial segment 4 lesion (6:128) is unchanged. A subcentimeter hypodensity in peripheral segment 7/8 (6:130) and subcentimeter enhancing focus in peripheral segment 7/8 (6:143) are too small to characterize. Additional cyst in the inferior right hepatic lobe. Musculoskeletal: No acute or abnormal lytic or blastic osseous lesions. Old left posterior rib fractures. IMPRESSION: 1. Interval increased density of 9 mm peripheral left upper lobe nodule, which is unchanged in size compared to 09/02/2024, suspicious for neoplasm. 2. Unchanged 3 mm previously hypermetabolic left upper lobe peribronchovascular nodule. 3. Interval increase in size of several hepatic lesions, suspicious for worsening metastatic disease, better evaluated on same-day MRI abdomen. 4. Aortic Atherosclerosis (ICD10-I70.0) and Emphysema (ICD10-J43.9). Coronary artery calcifications. Assessment for potential risk factor modification, dietary therapy or pharmacologic therapy may be warranted, if clinically indicated.  Electronically Signed   By: Limin  Xu M.D.   On: 11/01/2024 11:28   MR Abdomen W Wo Contrast Result Date: 10/28/2024 CLINICAL DATA:  f/u fir metastatic lung cancer to evaluate response to treatment. new liver lesion  indetermate. EXAM: MRI ABDOMEN WITHOUT AND WITH CONTRAST TECHNIQUE: Multiplanar multisequence MR imaging of the abdomen was performed both before and after the administration of intravenous contrast. CONTRAST:  6mL GADAVIST  GADOBUTROL  1 MMOL/ML IV SOLN COMPARISON:  CT scan chest from 09/02/2024. FINDINGS: Lower chest: Unremarkable MR appearance to the lung bases. No pleural effusion. No pericardial effusion. Normal heart size. Hepatobiliary: The liver is normal in size. Noncirrhotic configuration. There are at least 4, mildly T2 hyperintense, target like lesions in the liver (marked with electronic arrow sign on series 4, images 5, 9, 11 and 17) with largest in the left hepatic lobe, segment 4A measuring up to 2.1 x 2.4 cm. These are compatible with metastases. These lesions corresponds to the lesions described on the prior CT scan chest. There is interval increase in the size of the lesion, favoring worsening metastatic disease. There are additional several T2 hyperintense lesions with largest in the right hepatic lobe, segment 5 measuring up to 10 x 14 mm (series 4, images 18, 21 and 23), which can be characterized as simple cysts. No intrahepatic or extrahepatic bile duct dilatation. No choledocholithiasis. Small volume dependent gallstones noted without imaging signs of acute cholecystitis. Pancreas: No mass, inflammatory changes or other parenchymal abnormality identified. No main pancreatic duct dilation. Spleen:  Lobulated spleen noted.  No focal lesion. Adrenals/Urinary Tract: Unremarkable adrenal glands. Mild diffuse cortical thinning of left kidney. Probable focal scarring noted in the left kidney upper pole. No hydroureteronephrosis on either side. Bilateral extrarenal pelvis noted. There are multiple scattered subcentimeter sized simple cortical cysts in bilateral kidneys. No suspicious renal mass. Stomach/Bowel: Visualized portions within the abdomen are unremarkable. No disproportionate dilation of  bowel loops. Vascular/Lymphatic: No pathologically enlarged lymph nodes identified. No abdominal aortic aneurysm demonstrated. No ascites. Other:  None. Musculoskeletal: No suspicious bone lesions identified. IMPRESSION: 1. There are at least 4, target like lesions in the liver, compatible with worsening metastases. 2. Multiple other simple cysts in the liver. 3. Cholelithiasis without acute cholecystitis. 4. Multiple other nonacute observations, as described above. Electronically Signed   By: Ree Molt M.D.   On: 10/28/2024 16:01   CT Chest Wo Contrast Result Date: 09/06/2024 CLINICAL DATA:  Non-small-cell lung cancer. Restaging. History of esophageal cancer. * Tracking Code: BO * EXAM: CT CHEST WITHOUT CONTRAST TECHNIQUE: Multidetector CT imaging of the chest was performed following the standard protocol without IV contrast. RADIATION DOSE REDUCTION: This exam was performed according to the departmental dose-optimization program which includes automated exposure control, adjustment of the mA and/or kV according to patient size and/or use of iterative reconstruction technique. COMPARISON:  Chest CT 01/30/2024.  PET-CT 04/22/2024 FINDINGS: Cardiovascular: The heart size is normal. No substantial pericardial effusion. Coronary artery calcification is evident. Moderate atherosclerotic calcification is noted in the wall of the thoracic aorta. Right Port-A-Cath tip is positioned in the distal SVC. Mediastinum/Nodes: No mediastinal lymphadenopathy. No evidence for gross hilar lymphadenopathy although assessment is limited by the lack of intravenous contrast on the current study. The esophagus has normal imaging features. There is no axillary lymphadenopathy. Lungs/Pleura: Centrilobular emphsyema noted. The left parahilar nodule seen on previous PET-CT measured approximately 15 x 13 mm and was hypermetabolic at that time. This nodule has essentially resolved with a potential 2-3 mm  subtle nodular focus on image  66/6 today. 10 x 9 mm lobular nodule in the left upper lobe (43/6) may represent a cluster of tiny nodules. This is progressive and much more conspicuous than on the previous PET-CT of 04/22/2024 and is new since chest CT of 01/30/2024. No other new suspicious pulmonary nodule or mass. No focal airspace consolidation. No pleural effusion. Upper Abdomen: 10 mm low-density lesion in the dome of the liver seen on 123/2 appears to be new since the prior 2 exams although those studies were performed without intravenous contrast material. 2.1 cm lesion in the medial dome of the liver on 129/2 also appears to be new. Both of these lesions have attenuation higher than would be expected for a cyst. 14 mm low-density lesion in the inferior right liver on 165/2 approaches water  density and was present on the previous studies, likely a benign cyst. No adrenal nodule or mass. Musculoskeletal: No worrisome lytic or sclerotic osseous abnormality. IMPRESSION: 1. The left parahilar nodule seen on previous PET-CT has essentially resolved with a potential 2-3 mm subtle nodular focus at the location on today's study. 2. 10 x 9 mm lobular nodule in the left upper lobe may represent a cluster of tiny nodules. This is progressive and much more conspicuous than on the previous PET-CT of 04/22/2024 and is new since chest CT of 01/30/2024. Potentially infectious/inflammatory, close follow-up warranted as neoplasm is not excluded. 3. 2 low-density lesions in the dome of the liver appear to be new since the prior 2 exams although those studies were performed without intravenous contrast material. Metastatic disease a concern. Contrast infused abdominal CT or MRI of the abdomen with and without contrast recommended to further evaluate. 4. Aortic Atherosclerosis (ICD10-I70.0) and Emphysema (ICD10-J43.9). These results will be called to the ordering clinician or representative by the Radiologist Assistant, and communication documented in the PACS  or [employer]. Electronically Signed   By: Camellia Candle M.D.   On: 09/06/2024 05:53    ASSESSMENT & PLAN:  83 y.o. female with  #1 Metastatic poorly differentiated carcinoma with likely lung primary non-small cell lung cancer.     CT of the head with and without contrast showed no evidence of metastatic disease. EGFR blood test mutation analysis negative. CT chest abdomen pelvis 04/19/2016 shows no evidence of disease progression. Patient tolerated Nivolumab  very well but was discontinued when she developed grade 2 Immune colitis. Has been off Nivolumab  for >6 months   CT chest abdomen pelvis on 06/24/2016 shows no evidence of new disease or progression of metastatic disease. CT chest abdomen pelvis 09/06/2016 shows 1. Mixed interval response to therapy. 2. There is a new left ventral chest wall lesion deep to the pectoralis musculature worrisome for metastatic disease. 3. Posterior lower lobe nodular densities are identified which may reflect areas of pulmonary metastasis. 4. Interval decrease in size of destructive lesion involving the left iliac bone.   CT chest abd pelvis 12/08/2016: Cystic mass involving the left ventral chest wall has resolved in the interval. Likely was a hematoma due to trauma. Interval increase in size of pleural base mass overlying the posterior and inferior left lower lobe. There is also a new left pleural effusion identified.   CT chest 02/01/2017: Residual irregular soft tissue thickening/volume loss and trace left pleural fluid at the base of the left hemithorax, overall improved in appearance from 12/08/2016. No measurable lesion.   CT chest 05/29/2017 shows no residual pleural based mass or significant pleural effusion in the  left hemithorax. No evidence of thoracic metastatic disease. No evidence of progressive metastatic disease within the abdomen or pelvis. Mixed lytic and blastic lesion involving the left iliac bone and associated pathologic fracture are  unchanged.    CT CAP 09/14/17 shows no new changes. She does have slight displacement of her fractured left iliac bone. Evidence of stable disease.    CT CAP 01/04/2018- No new or progressive metastatic disease. Stable large left iliac bone metastasis with associated chronic pathologic fracture.    CT chest/abd/pelvis done on 04/26/18 revealed Stable exam.  No new or progressive interval findings.   07/19/18 CT C/A/P revealed Stable exam.  No new or progressive interval findings. Large destructive left iliac lesion is similar to prior. Aortic Atherosclerosis and Emphysema.     11/06/18 CT C/A/P revealed Similar appearance of large mixed lytic and sclerotic lesion in the left ilium. No new metastatic lesions are otherwise noted elsewhere in the chest, abdomen or pelvis. 2. Interval development of thickening of the distal third of the esophagus. This is nonspecific, and could be related to underlying reflux esophagitis. However, if there is any clinical concern for Barrett's metaplasia or esophageal neoplasia, further evaluation with nonemergent endoscopy could be considered. 3. Aortic atherosclerosis, in addition to left main coronary artery disease. Assessment for potential risk factor modification, dietary therapy or pharmacologic therapy may be warranted, if clinically Indicated. 4. Diffuse bronchial wall thickening with mild to moderate centrilobular and paraseptal emphysema; imaging findings suggestive of underlying COPD. 5. Additional incidental findings, as above.   #2  Adenocarcinoma of the Esophagus  Barrett's esophagus 4cms in the distal esophagus with low and high-grade dysplasia   01/02/19 Surgical pathology revealed adenocarcinoma of the esophagus    01/25/19 PET/CT revealed Distal esophageal primary, without hypermetabolic metastatic disease. 2. Chronic left iliac metastasis, as before. 3. Hypermetabolism within and superficial to the right gluteal musculature is most likely related to trauma  and/or injection sites. 4. Aortic atherosclerosis, coronary artery atherosclerosis and emphysema.   S/p concurrent Carboplatin  and Taxol  weekly with RT of 45 Gy in 25 fractions and 5.4 Gy boost, completed between 02/04/19 and 03/27/19   07/03/2019 PET skull base to thigh revealed 1. Interval response to therapy. Significant reduction in FDG uptake associated with distal esophageal mass. SUV max currently 2.61 versus 16.97 previously. 2. 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 Unchanged 3. Aortic Atherosclerosis (ICD10-I70.0) and Emphysema (ICD10-J43.9). Coronary artery calcifications.   07/15/2020 CT C/A/P ([id number]) ([id number]) revealed 1. No evidence of new or progressive metastatic disease in the chest, abdomen or pelvis.   #3 diarrhea-  now resolved was previously. 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 She was previously on on Lialda , budesonide ,probiotics and lomotil  but not currently taking any of these. Plan -Continue Sandostatin  every 4 weeks    #4 h/o DVT and PE  -continue on Xarelto  - no issues with bleeding    #5 history of COPD management per primary care physician   #6 hx of severe shingles of the forehead with postherpetic neuralgia   PLAN: - Discussed lab results on 11/05/2024 in detail with patient: CBC stable  CMP with Glucose 207 increased from 128 and Potassium 3.4 decreased from 3.6.   - Reviewed 10/18/2024 Abdominal MRI: 1. There are at least 4, target like lesions in the liver, compatible with worsening metastases.  2. Multiple other simple cysts in  the liver.  3. Cholelithiasis without acute cholecystitis.  4. Multiple other nonacute observations, as described above.   - Reviewed 11/01/2024 CT Chest: 1. Interval increased density of 9 mm peripheral left upper lobe nodule, which is unchanged in size compared to  09/02/2024, suspicious for neoplasm.  2. Unchanged 3 mm previously hypermetabolic left upper lobe peribronchovascular nodule.  3. Interval increase in size of several hepatic lesions, suspicious for worsening metastatic disease, better evaluated on same-day MRI abdomen.  4. Aortic Atherosclerosis and Emphysema. Coronary artery calcifications.    FOLLOW-UP in *** for labs and follow-up with Dr. Onesimo.  The total time spent in the appointment was *** minutes* .  All of the patient's questions were answered and the patient knows to call the clinic with any problems, questions, or concerns.  Cindy Onesimo MD MS AAHIVMS [facility] Hematology/Oncology Physician [facility]  *Total Encounter Time as defined by the Centers for Medicare and Medicaid Services includes, in addition to the face-to-face time of a patient visit (documented in the note above) non-face-to-face time: obtaining and reviewing outside history, ordering and reviewing medications, tests or procedures, care coordination (communications with other health care professionals or caregivers) and documentation in the medical record.  I,Emily Lagle,acting as a neurosurgeon for Cindy Onesimo, MD.,have documented all relevant documentation on the behalf of Cindy Onesimo, MD,as directed by  Cindy Onesimo, MD while in the presence of Cindy Onesimo, MD.  I have reviewed the above documentation for accuracy and completeness, and I agree with the above.  Gautam Kale, MD

## 2024-11-12 ENCOUNTER — Encounter: Payer: Self-pay | Admitting: Hematology

## 2024-11-12 NOTE — Progress Notes (Unsigned)
 Philip Cornet, MD  Baldwin Rosella L PROCEDURE / BIOPSY REVIEW Date: 11/12/24  Requested Biopsy site: Liver lesion Reason for request: Needs diagnosis and molecular testing Imaging review: Best seen on MRI 10/28/24  Decision: Approved Imaging modality to perform: Ultrasound Schedule with: Moderate Sedation Schedule for: Any VIR  Additional comments: Liver lesions, lateral left lobe lesion may be most accessible.  Please contact me with questions, concerns, or if issue pertaining to this request arise.  Cornet JONELLE Philip, MD [facility]       Previous Messages    ----- Message ----- From: Baldwin Rosella CROME Sent: 11/12/2024   8:33 AM EST To: Rosella CROME Baldwin; Taryn F Rigney, RT; Ir Proc*  Procedure :CT Biopsy  Reason :Patient with hx lung cancer and esophageal carcinoma with liver lesions concerning for metastasis-- Bx for tissue diagnosis and molecular profiling Dx: Cancer, metastatic to liver (HCC) [C78.7 (ICD-10-CM)]    History :MR Abdomen W Wo Contrast ,CT Chest W Contrast,CT Chest Wo Contrast,NM PET Image Restag (PS) Skull Base To Thigh,  Provider: Onesimo Emaline Brink, MD  Provider contact ;  [phone]

## 2024-11-21 ENCOUNTER — Ambulatory Visit

## 2024-12-12 ENCOUNTER — Telehealth: Payer: Self-pay

## 2024-12-12 NOTE — Telephone Encounter (Signed)
 A user error has taken place: encounter opened in error, closed for administrative reasons.

## 2024-12-13 ENCOUNTER — Other Ambulatory Visit: Payer: Self-pay | Admitting: Radiology

## 2024-12-13 DIAGNOSIS — K769 Liver disease, unspecified: Secondary | ICD-10-CM

## 2024-12-13 NOTE — H&P (Incomplete)
 "  Chief Complaint: Lung cancer/esophageal cancer with liver lesions concerning for metastasis-- referred for image guided liver lesion biopsy for tissue diagnosis and molecular profiling   Referring Provider(s): Kale,G  Supervising Physician: Hughes Simmonds  Patient Status: [name] - Out-pt  History of Present Illness: Cindy Byrd is an 84 .   *** Patient is Full Code  Past Medical History:  Diagnosis Date   Barrett's esophagus    Bilateral pulmonary embolism (HCC) 09/02/2016   09/02/16 bilateral pulmonary emboli in context of extensive bilateral lower extremity deep venous thromboses Assumed hypercoagulability due to non-small cell metastatic lung cancer Lifelong anticoagulation recommended   Bone neoplasm 06/24/2015   Cancer (HCC)    metastatic poorly differentiated carcinoma. tumor left groin surgical removal with radiation tx.   Cataract    BILATERAL   Cigarette smoker two packs a day or less    Currently still smoking 2 PPD - Not interested in quitting at this time.   Colitis 2017   Colon polyps    hyperplastic, tubular adenomas, tubulovillous adenoma   Cough, persistent    hx. lung cancer ? primary-being evaluated, unsure of primary site.   Depression 06/24/2015   Diverticulosis    DVT of lower extremity, bilateral (HCC) 09/02/2016   Dysrhythmia    PAF in setting of submassive PE 09/2016   Emphysema of lung (HCC)    Endometriosis    Hysterectomy with BSO at age 84 yrs   Esophageal adenocarcinoma (HCC) 08/11/2015   intramucosal   Gastritis    GERD (gastroesophageal reflux disease)    H/O: pneumonia    Hiatal hernia    Hyperglycemia    A1c 7.4% 06/20/22   Hyperlipidemia    Hypertension 06/24/2015   likely improved incidental to 40 lbs weight loss from her neoplasm. No Longer taking med for this as of 08-06-15   IBS (irritable bowel syndrome)    Lung cancer (HCC)    suspected IV lung cancer with left ileum mets 2017, s/p left ileum radiaiton, immunotherapy    Pain    left hip-persistenttumor of bone-radiation tx. 10.   Pneumonia    Vitamin D  deficiency disease     Past Surgical History:  Procedure Laterality Date   ABDOMINAL HYSTERECTOMY     BALLOON DILATION N/A 10/08/2019   Procedure: BALLOON DILATION;  Surgeon: Albertus Gordy HERO, MD;  Location: [facility];  Service: Gastroenterology;  Laterality: N/A;   BARTHOLIN GLAND CYST EXCISION  84 yo ago   Does not want if it was an infected cyst or tumor. Was soon as delivery   BIOPSY  01/02/2019   Procedure: BIOPSY;  Surgeon: Albertus Gordy HERO, MD;  Location: [facility];  Service: Gastroenterology;;   CATARACT EXTRACTION     COLONOSCOPY W/ POLYPECTOMY     multiple times - last done 09/2014 per patient.   ESOPHAGOGASTRODUODENOSCOPY (EGD) WITH PROPOFOL  N/A 08/11/2015   Procedure: ESOPHAGOGASTRODUODENOSCOPY (EGD) WITH PROPOFOL ;  Surgeon: Gordy HERO Albertus, MD;  Location: [facility];  Service: Gastroenterology;  Laterality: N/A;   ESOPHAGOGASTRODUODENOSCOPY (EGD) WITH PROPOFOL  N/A 01/02/2019   Procedure: ESOPHAGOGASTRODUODENOSCOPY (EGD) WITH PROPOFOL ;  Surgeon: Albertus Gordy HERO, MD;  Location: [facility];  Service: Gastroenterology;  Laterality: N/A;   ESOPHAGOGASTRODUODENOSCOPY (EGD) WITH PROPOFOL  N/A 10/08/2019   Procedure: ESOPHAGOGASTRODUODENOSCOPY (EGD) WITH PROPOFOL ;  Surgeon: Albertus Gordy HERO, MD;  Location: [facility];  Service: Gastroenterology;  Laterality: N/A;   EYE SURGERY Right 2024   Multiple surgeries over the past year.   FLEXIBLE SIGMOIDOSCOPY N/A 06/24/2017  Procedure: FLEXIBLE SIGMOIDOSCOPY;  Surgeon: Leigh Elspeth Mt, MD;  Location: [facility];  Service: Gastroenterology;  Laterality: N/A;   GANGLION CYST EXCISION     KNEE ARTHROSCOPY  age about 84 yrs   TONSILLECTOMY     TOTAL ABDOMINAL HYSTERECTOMY W/ BILATERAL SALPINGOOPHORECTOMY  at age 84 yrs   For endometriosis   VIDEO BRONCHOSCOPY WITH ENDOBRONCHIAL ULTRASOUND Bilateral 06/06/2023   Procedure: VIDEO BRONCHOSCOPY WITH  ENDOBRONCHIAL ULTRASOUND;  Surgeon: Brenna Adine CROME, DO;  Location: [facility];  Service: Cardiopulmonary;  Laterality: Bilateral;    Allergies: Penicillins, Remeron  [mirtazapine ], and Latex  Medications: Prior to Admission medications  Medication Sig Start Date End Date Taking? Authorizing Provider  apixaban  (ELIQUIS ) 5 MG TABS tablet Take 1 tablet (5 mg total) by mouth 2 (two) times daily. 08/19/24   Chandra Toribio POUR, MD  budesonide -glycopyrrolate-formoterol  (BREZTRI  AEROSPHERE) 160-9-4.8 MCG/ACT AERO inhaler INHALE 2 PUFFS INTO THE LUNGS TWICE A DAY 10/14/24   Chandra Toribio POUR, MD  buPROPion  (WELLBUTRIN  XL) 150 MG 24 hr tablet TAKE 1 TABLET BY MOUTH EVERY DAY 05/27/24   Chandra Toribio POUR, MD  doxepin  (SINEQUAN ) 10 MG capsule TAKE 1 CAPSULE BY MOUTH AT BEDTIME. 11/06/24   Chandra Toribio POUR, MD  DULoxetine  (CYMBALTA ) 30 MG capsule TAKE 3 CAPSULES BY MOUTH EVERY DAY 05/27/24   Chandra Toribio POUR, MD  fentaNYL  (DURAGESIC ) 25 MCG/HR Place 1 patch onto the skin every 3 (three) days. 07/01/24   Onesimo Emaline Brink, MD  gabapentin  (NEURONTIN ) 800 MG tablet TAKE 1 TABLET BY MOUTH THREE TIMES A DAY 08/28/24   Chandra Toribio POUR, MD  HYDROcodone -acetaminophen  (NORCO) 5-325 MG tablet Take 1 tablet by mouth every 6 (six) hours as needed for moderate pain (pain score 4-6). 04/12/24   Onesimo Emaline Brink, MD  lidocaine -prilocaine  (EMLA ) cream Apply 1 Application topically as needed. 04/12/24   Onesimo Emaline Brink, MD  omeprazole  (PRILOSEC) 40 MG capsule TAKE 1 CAPSULE BY MOUTH EVERY DAY BEFORE BREAKFAST 05/27/24   Onesimo Emaline Brink, MD  ondansetron  (ZOFRAN -ODT) 4 MG disintegrating tablet Take 1 tablet (4 mg total) by mouth every 8 (eight) hours as needed for nausea or vomiting. 05/29/23   Wallace Joesph LABOR, PA  valACYclovir  (VALTREX ) 500 MG tablet Take 1 tablet (500 mg total) by mouth 2 (two) times daily. 07/01/24   Onesimo Emaline Brink, MD     Family History  Problem Relation Age of Onset   Colon cancer Brother     Colon cancer Brother    Stroke Mother    Colon cancer Father    Emphysema Father        smoked   Breast cancer Daughter 41       ER/PR+ stage II    Social History   Socioeconomic History   Marital status: Widowed    Spouse name: Not on file   Number of children: 2   Years of education: Not on file   Highest education level: Not on file  Occupational History   Not on file  Tobacco Use   Smoking status: Former    Current packs/day: 0.00    Average packs/day: 1 pack/day for 60.0 years (60.0 ttl pk-yrs)    Types: Cigarettes    Start date: 12/05/1954    Quit date: 12/05/2014    Years since quitting: 10.0    Passive exposure: Never   Smokeless tobacco: Never  Vaping Use   Vaping status: Never Used  Substance and Sexual Activity   Alcohol use: No    Alcohol/week: 0.0  standard drinks of alcohol   Drug use: No   Sexual activity: Not Currently  Other Topics Concern   Not on file  Social History Narrative   Not on file   Social Drivers of Health   Tobacco Use: Medium Risk (10/14/2024)   Patient History    Smoking Tobacco Use: Former    Smokeless Tobacco Use: Never    Passive Exposure: Never  Physicist, Medical Strain: Low Risk (03/28/2023)   Overall Financial Resource Strain (CARDIA)    Difficulty of Paying Living Expenses: Not hard at all  Food Insecurity: No Food Insecurity (05/14/2024)   Hunger Vital Sign    Worried About Running Out of Food in the Last Year: Never true    Ran Out of Food in the Last Year: Never true  Transportation Needs: No Transportation Needs (05/14/2024)   PRAPARE - Administrator, Civil Service (Medical): No    Lack of Transportation (Non-Medical): No  Physical Activity: Inactive (03/28/2023)   Exercise Vital Sign    Days of Exercise per Week: 0 days    Minutes of Exercise per Session: 0 min  Stress: No Stress Concern Present (03/28/2023)   [employer] of Occupational Health - Occupational Stress Questionnaire    Feeling of  Stress : Not at all  Social Connections: Socially Isolated (01/31/2024)   Social Connection and Isolation Panel    Frequency of Communication with Friends and Family: Three times a week    Frequency of Social Gatherings with Friends and Family: Once a week    Attends Religious Services: Never    Database Administrator or Organizations: No    Attends Banker Meetings: Never    Marital Status: Widowed  Depression (PHQ2-9): Low Risk (10/14/2024)   Depression (PHQ2-9)    PHQ-2 Score: 0  Alcohol Screen: Low Risk (03/28/2023)   Alcohol Screen    Last Alcohol Screening Score (AUDIT): 0  Housing: Low Risk (05/14/2024)   Housing Stability Vital Sign    Unable to Pay for Housing in the Last Year: No    Number of Times Moved in the Last Year: 0    Homeless in the Last Year: No  Utilities: Not At Risk (05/14/2024)   AHC Utilities    Threatened with loss of utilities: No  Health Literacy: Not on file       Review of Systems  Vital Signs:   Advance Care Plan: No documents on file   Physical Exam  Imaging: No results found.  Labs:  CBC: Recent Labs    04/10/24 1326 07/01/24 1234 09/23/24 1230 11/05/24 1153  WBC 8.4 9.4 10.5 10.3  HGB 13.7 13.4 12.4 13.2  HCT 41.6 41.0 38.0 39.8  PLT 264 334 331 341    COAGS: Recent Labs    01/31/24 0433 01/31/24 0621  APTT >200* 121*    BMP: Recent Labs    04/10/24 1326 07/01/24 1234 09/23/24 1230 11/05/24 1153  NA 139 141 140 139  K 3.9 4.3 3.6 3.4*  CL 106 109 107 103  CO2 25 28 29 24   GLUCOSE 91 111* 128* 207*  BUN 25* 21 15 13   CALCIUM  9.6 9.6 9.8 9.3  CREATININE 1.30* 1.03* 0.87 1.00  GFRNONAA 41* 54* >60 55*    LIVER FUNCTION TESTS: Recent Labs    04/10/24 1326 07/01/24 1234 09/23/24 1230 11/05/24 1153  BILITOT 0.2 0.3 0.3 0.3  AST 42* 15 22 23   ALT 30 9 16 21   ALKPHOS 68  61 51 80  PROT 7.9 7.6 7.5 7.8  ALBUMIN 3.8 3.5 3.6 3.7    TUMOR MARKERS: No results for input(s): AFPTM, CEA,  CA199, CHROMGRNA in the last 8760 hours.  Assessment and Plan: 84 y.o. female ex smoker with PMH sig for PE 2017, depression, diverticulosis, bilat LE DVT 2017, COPD, PAF, HTN,HLD, endometriosis, GERD, IBS and lung/esophageal cancer . Recent imaging revealed interval increase in size of several hepatic lesions, suspicious for worsening metastatic disease. She is scheduled today for image guided liver lesion biopsy  for tissue diagnosis and molecular profiling.Risks and benefits of procedure was discussed with the patient   including, but not limited to bleeding, infection, damage to adjacent structures or low yield requiring additional tests.  All of the questions were answered and there is agreement to proceed.  Consent signed and in chart.    Thank you for allowing our service to participate in SAPHYRE CILLO 's care.  Electronically Signed: D. Franky Rakers, PA-C   12/13/2024, 4:35 PM      I spent a total of  25 minutes   in face to face in clinical consultation, greater than 50% of which was counseling/coordinating care for image guided liver lesion biopsy   "

## 2024-12-16 ENCOUNTER — Ambulatory Visit (HOSPITAL_COMMUNITY)
Admission: RE | Admit: 2024-12-16 | Discharge: 2024-12-16 | Disposition: A | Source: Ambulatory Visit | Attending: Hematology

## 2024-12-16 ENCOUNTER — Ambulatory Visit: Admitting: Family Medicine

## 2024-12-16 ENCOUNTER — Encounter (HOSPITAL_COMMUNITY): Payer: Self-pay

## 2024-12-16 ENCOUNTER — Other Ambulatory Visit: Payer: Self-pay

## 2024-12-16 DIAGNOSIS — Z87891 Personal history of nicotine dependence: Secondary | ICD-10-CM | POA: Diagnosis not present

## 2024-12-16 DIAGNOSIS — Z95828 Presence of other vascular implants and grafts: Secondary | ICD-10-CM | POA: Diagnosis not present

## 2024-12-16 DIAGNOSIS — Z86718 Personal history of other venous thrombosis and embolism: Secondary | ICD-10-CM | POA: Diagnosis not present

## 2024-12-16 DIAGNOSIS — F32A Depression, unspecified: Secondary | ICD-10-CM | POA: Diagnosis not present

## 2024-12-16 DIAGNOSIS — K219 Gastro-esophageal reflux disease without esophagitis: Secondary | ICD-10-CM | POA: Diagnosis not present

## 2024-12-16 DIAGNOSIS — Z86711 Personal history of pulmonary embolism: Secondary | ICD-10-CM | POA: Diagnosis not present

## 2024-12-16 DIAGNOSIS — J439 Emphysema, unspecified: Secondary | ICD-10-CM | POA: Insufficient documentation

## 2024-12-16 DIAGNOSIS — K769 Liver disease, unspecified: Secondary | ICD-10-CM

## 2024-12-16 DIAGNOSIS — C787 Secondary malignant neoplasm of liver and intrahepatic bile duct: Secondary | ICD-10-CM | POA: Insufficient documentation

## 2024-12-16 DIAGNOSIS — Z8501 Personal history of malignant neoplasm of esophagus: Secondary | ICD-10-CM | POA: Diagnosis not present

## 2024-12-16 DIAGNOSIS — Z66 Do not resuscitate: Secondary | ICD-10-CM | POA: Diagnosis not present

## 2024-12-16 DIAGNOSIS — C7B8 Other secondary neuroendocrine tumors: Secondary | ICD-10-CM | POA: Diagnosis not present

## 2024-12-16 DIAGNOSIS — Z85118 Personal history of other malignant neoplasm of bronchus and lung: Secondary | ICD-10-CM | POA: Insufficient documentation

## 2024-12-16 LAB — COMPREHENSIVE METABOLIC PANEL WITH GFR
ALT: 12 U/L (ref 0–44)
AST: 45 U/L — ABNORMAL HIGH (ref 15–41)
Albumin: 3.7 g/dL (ref 3.5–5.0)
Alkaline Phosphatase: 75 U/L (ref 38–126)
Anion gap: 11 (ref 5–15)
BUN: 15 mg/dL (ref 8–23)
CO2: 25 mmol/L (ref 22–32)
Calcium: 10 mg/dL (ref 8.9–10.3)
Chloride: 105 mmol/L (ref 98–111)
Creatinine, Ser: 1.07 mg/dL — ABNORMAL HIGH (ref 0.44–1.00)
GFR, Estimated: 51 mL/min — ABNORMAL LOW
Glucose, Bld: 106 mg/dL — ABNORMAL HIGH (ref 70–99)
Potassium: 3.7 mmol/L (ref 3.5–5.1)
Sodium: 141 mmol/L (ref 135–145)
Total Bilirubin: 0.3 mg/dL (ref 0.0–1.2)
Total Protein: 7.9 g/dL (ref 6.5–8.1)

## 2024-12-16 LAB — CBC WITH DIFFERENTIAL/PLATELET
Abs Immature Granulocytes: 0.03 K/uL (ref 0.00–0.07)
Basophils Absolute: 0.1 K/uL (ref 0.0–0.1)
Basophils Relative: 1 %
Eosinophils Absolute: 0.3 K/uL (ref 0.0–0.5)
Eosinophils Relative: 3 %
HCT: 40.8 % (ref 36.0–46.0)
Hemoglobin: 13.1 g/dL (ref 12.0–15.0)
Immature Granulocytes: 0 %
Lymphocytes Relative: 20 %
Lymphs Abs: 1.9 K/uL (ref 0.7–4.0)
MCH: 31.4 pg (ref 26.0–34.0)
MCHC: 32.1 g/dL (ref 30.0–36.0)
MCV: 97.8 fL (ref 80.0–100.0)
Monocytes Absolute: 0.8 K/uL (ref 0.1–1.0)
Monocytes Relative: 9 %
Neutro Abs: 6.2 K/uL (ref 1.7–7.7)
Neutrophils Relative %: 67 %
Platelets: 350 K/uL (ref 150–400)
RBC: 4.17 MIL/uL (ref 3.87–5.11)
RDW: 14.6 % (ref 11.5–15.5)
WBC: 9.3 K/uL (ref 4.0–10.5)
nRBC: 0 % (ref 0.0–0.2)

## 2024-12-16 LAB — PROTIME-INR
INR: 1 (ref 0.8–1.2)
Prothrombin Time: 14 s (ref 11.4–15.2)

## 2024-12-16 MED ORDER — MIDAZOLAM HCL 2 MG/2ML IJ SOLN
INTRAMUSCULAR | Status: AC
  Filled 2024-12-16: qty 2

## 2024-12-16 MED ORDER — OXYCODONE HCL 5 MG PO TABS: 10.0000 mg | ORAL_TABLET | ORAL | Status: DC | PRN

## 2024-12-16 MED ORDER — FENTANYL CITRATE (PF) 100 MCG/2ML IJ SOLN
INTRAMUSCULAR | Status: AC | PRN
  Administered 2024-12-16: 50 ug via INTRAVENOUS

## 2024-12-16 MED ORDER — LIDOCAINE HCL 1 % IJ SOLN
INTRAMUSCULAR | Status: AC
  Filled 2024-12-16: qty 20

## 2024-12-16 MED ORDER — SODIUM CHLORIDE 0.9 % IV SOLN: INTRAVENOUS | Status: DC

## 2024-12-16 MED ORDER — MIDAZOLAM HCL (PF) 2 MG/2ML IJ SOLN
INTRAMUSCULAR | Status: AC | PRN
  Administered 2024-12-16: 1 mg via INTRAVENOUS

## 2024-12-16 MED ORDER — GELATIN ABSORBABLE 12-7 MM EX MISC
CUTANEOUS | Status: AC
  Filled 2024-12-16: qty 1

## 2024-12-16 MED ORDER — FENTANYL CITRATE (PF) 100 MCG/2ML IJ SOLN
INTRAMUSCULAR | Status: AC
  Filled 2024-12-16: qty 2

## 2024-12-16 NOTE — Procedures (Signed)
 Vascular and Interventional Radiology Procedure Note  Patient: Cindy Byrd DOB: Apr 15, 1941 Medical Record Number: [id number] Note Date/Time: 12/16/2024 12:05 PM   Performing Physician: Thom Hall, MD Assistant(s): None  Diagnosis: Liver masses. Hx met lung CA   Procedure: LIVER MASS BIOPSY  Anesthesia: Conscious Sedation Complications: None Estimated Blood Loss: Minimal Specimens: Sent for Pathology  Findings:  Successful Ultrasound-guided biopsy of liver mass. A total of 3 samples were obtained. Hemostasis of the tract was achieved using Gelfoam Slurry Embolization.  Plan: Bed rest for 2 hours.  See detailed procedure note with images in PACS. The patient tolerated the procedure well without incident or complication and was returned to Recovery in stable condition.    Thom Hall, MD [facility]   Pager. [phone] Clinic. [phone]

## 2024-12-16 NOTE — Discharge Instructions (Signed)
Please call Interventional Radiology clinic [phone] with any questions or concerns.  You may remove your dressing and shower tomorrow.  After the procedure, it is common to have: Soreness Bruising Mild pain You may also feel tired for a few days  Follow these instructions at home:  Medication: Do not use Aspirin or ibuprofen products, such as Advil or Motrin, as it may increase bleeding.  You may resume your usual medications as ordered by your doctor If your doctor prescribed antibiotics, take them as directed. Do not stop taking them just because you feel better. You need to take the full course of antibiotics.  Eating and drinking: Drink plenty of liquids to keep your urine pale yellow You can resume your regular diet as directed by your doctor  Care of the procedure site Follow instructions from your doctor about how to take care of your cut from surgery (incisions). Make sure you: Wash your hands with soap and water for at least 20 seconds before and after you change your bandage. If you cannot use soap and water, use hand sanitizer Change your bandage Leave stitches or skin glue in place for at least two weeks Leave tape strips alone unless you are told to take them off. You may trim the edges of the tape strips if they curl up. Check your incision every day for signs of infection. Check for: Redness, swelling, or more pain Fluid or blood Warmth Pus or a bad smell  Activity Rest at home for 1-2 days, or as told by your doctor Get up to take short walks every 1 to 2 hours. Ask for help if you feel weak or unsteady Do not lift anything that is heavier than 10 lb (4.5 kg), or the limit that you are told Do not play contact sports for 2 weeks after the procedure Return to your normal activities as told by your doctor Do not take baths, swim, or use a hot tub until your health care provider approves. Take showers only. Keep all follow-up visits as told by your  doctor  Contact a doctor if: You have more bleeding in your incision Your incision swells, or is red and more painful You have fluid that comes from your incision You develop a rash You have fever or chills  Get help right away if: You have swelling, bloating, or pain in your belly (abdomen) You get dizzy or faint You vomit or you feel like vomiting You have trouble breathing or feel short of breath You have chest pain You have problems talking or seeing You have trouble with your balance or moving your arms or legs  Moderate Conscious Sedation-Care After  This sheet gives you information about how to care for yourself after your procedure. Your health care provider may also give you more specific instructions. If you have problems or questions, contact your health care provider.  After the procedure, it is common to have: Sleepiness for several hours. Impaired judgment for several hours. Difficulty with balance. Vomiting if you eat too soon.  Follow these instructions at home:  Rest. Do not participate in activities where you could fall or become injured. Do not drive or use machinery. Do not drink alcohol. Do not take sleeping pills or medicines that cause drowsiness. Do not make important decisions or sign legal documents. Do not take care of children on your own.  Eating and drinking Follow the diet recommended by your health care provider. Drink enough fluid to keep your urine pale yellow.  If you vomit: Drink water, juice, or soup when you can drink without vomiting. Make sure you have little or no nausea before eating solid foods.  General instructions Take over-the-counter and prescription medicines only as told by your health care provider. Have a responsible adult stay with you for the time you are told. It is important to have someone help care for you until you are awake and alert. Do not smoke. Keep all follow-up visits as told by your health care  provider. This is important.  Contact a health care provider if: You are still sleepy or having trouble with balance after 24 hours. You feel light-headed. You keep feeling nauseous or you keep vomiting. You develop a rash. You have a fever. You have redness or swelling around the IV site.  Get help right away if: You have trouble breathing. You have new-onset confusion at home.  This information is not intended to replace advice given to you by your health care provider. Make sure you discuss any questions you have with your healthcare provider.

## 2024-12-16 NOTE — Sedation Documentation (Signed)
 RN Coral Timme pulled 2 mg Versed and 100 mcg Fentanyl in US  room pysix. Pt. Received 2 mg Versed and 100 mcg Fentanyl throughout the procedure.

## 2024-12-18 ENCOUNTER — Other Ambulatory Visit: Payer: Self-pay | Admitting: Hematology

## 2024-12-18 LAB — SURGICAL PATHOLOGY

## 2024-12-19 ENCOUNTER — Encounter: Payer: Self-pay | Admitting: Hematology

## 2024-12-26 ENCOUNTER — Inpatient Hospital Stay: Attending: Hematology | Admitting: Hematology

## 2024-12-26 VITALS — BP 142/68 | HR 66 | Temp 97.7°F | Resp 20 | Wt 145.6 lb

## 2024-12-26 DIAGNOSIS — Z7901 Long term (current) use of anticoagulants: Secondary | ICD-10-CM | POA: Insufficient documentation

## 2024-12-26 DIAGNOSIS — Z86711 Personal history of pulmonary embolism: Secondary | ICD-10-CM | POA: Insufficient documentation

## 2024-12-26 DIAGNOSIS — Z79899 Other long term (current) drug therapy: Secondary | ICD-10-CM | POA: Diagnosis not present

## 2024-12-26 DIAGNOSIS — Z87891 Personal history of nicotine dependence: Secondary | ICD-10-CM | POA: Insufficient documentation

## 2024-12-26 DIAGNOSIS — C3490 Malignant neoplasm of unspecified part of unspecified bronchus or lung: Secondary | ICD-10-CM | POA: Insufficient documentation

## 2024-12-26 DIAGNOSIS — Z800 Family history of malignant neoplasm of digestive organs: Secondary | ICD-10-CM | POA: Insufficient documentation

## 2024-12-26 DIAGNOSIS — Z923 Personal history of irradiation: Secondary | ICD-10-CM | POA: Diagnosis not present

## 2024-12-26 DIAGNOSIS — Z7189 Other specified counseling: Secondary | ICD-10-CM

## 2024-12-26 DIAGNOSIS — C7951 Secondary malignant neoplasm of bone: Secondary | ICD-10-CM | POA: Diagnosis present

## 2024-12-26 DIAGNOSIS — Z79624 Long term (current) use of inhibitors of nucleotide synthesis: Secondary | ICD-10-CM | POA: Insufficient documentation

## 2024-12-26 DIAGNOSIS — Z86718 Personal history of other venous thrombosis and embolism: Secondary | ICD-10-CM | POA: Insufficient documentation

## 2024-12-26 DIAGNOSIS — Z803 Family history of malignant neoplasm of breast: Secondary | ICD-10-CM | POA: Diagnosis not present

## 2024-12-26 DIAGNOSIS — Z8501 Personal history of malignant neoplasm of esophagus: Secondary | ICD-10-CM | POA: Insufficient documentation

## 2024-12-26 DIAGNOSIS — Z9221 Personal history of antineoplastic chemotherapy: Secondary | ICD-10-CM | POA: Diagnosis not present

## 2024-12-26 NOTE — Progress Notes (Signed)
 " HEMATOLOGY ONCOLOGY PROGRESS NOTE  Date of service: 12/26/2024  Patient Care Team: Chandra Toribio POUR, MD as PCP - General (Family Medicine) Onesimo Emaline Brink, MD as Consulting Physician (Hematology and Oncology)  CHIEF COMPLAINT/PURPOSE OF CONSULTATION: Follow-up for continued evaluation and management of metastatic lung cancer; esophageal cancer.   HISTORY OF PRESENTING ILLNESS: (06/24/2015) Cindy Byrd is a pleasant 84 year old Caucasian female with below mentioned past medical history including hypertension, irritable bowel syndrome, heavy cigarette smoking more than 100 pack years, family history of colon cancer in her father and 2 brothers and breast cancer in her daughter has been referred to us  in consultation by her primary care physician Dr. Lynwood Ly for evaluation and management of her newly noted left ilium mass.   Patient was apparently in her usual state of health until the first of this year when she noted some left hip/groin pain. At first it was mild and was not bothersome she was able to motor along. Over the last month or so the pain started getting progressively worse. She notes that she had a couple of left hip steroid shots by her primary care physician without any alleviation of her pain. She notes that she subsequently had a CT of the abdomen and pelvis done on 06/12/2015 which revealed an aggressive looking left ilium 9 cm mass with infiltration of the iliacus and gluteal muscles causing significant pain and some weakness in her left lower extremity. She feels that the left lower extremity is unstable and does not feel comfortable bearing weight on it and taking the right foot of the ground.   She notes that she was started on oxycodone  IR 5 mg every 6 hours as needed for pain which is only somewhat controlling her pain. She notes significant anorexia that has been progressive and has resulted in about a 40 pound weight loss since earlier this year. She notes no overt  nausea. Does have chronic diarrhea which is related to her IBS but is better since being on narcotics.   She notes that she has not been taking her hydrochlorothiazide or blood pressure. Feels like she is eating poorly. Her blood pressure was in the high 80s systolic today without any dizziness or orthostasis with normal mentation. This is likely due to her lack of needing antihypertensives due to her significant weight loss.   She also notes feeling depressed and not sleeping well as a function of anxiety as well as untreated pain. She is open to medications to help with optimizing her pain control and improving her appetite.   She is anxious to reach a diagnosis as soon as possible and start treating this. She mentions he has a wonderful family who has been very supportive and she intends to fight to beat her illness so she can spend more time with her family.      SUMMARY OF ONCOLOGIC HISTORY: Oncology History  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)  01/14/2019 Initial Diagnosis   Adenocarcinoma of cardio-esophageal junction (HCC)   01/21/2019 - 03/20/2019 Chemotherapy   The patient had dexamethasone  (DECADRON ) 4 MG tablet, 8 mg, Oral, Daily, 1 of 1 cycle, Start date: 01/14/2019, End date: 04/03/2019 palonosetron  (ALOXI ) injection 0.25 mg, 0.25 mg, Intravenous,  Once, 1 of 1 cycle Administration: 0.25 mg (01/21/2019), 0.25 mg (02/12/2019), 0.25 mg (02/19/2019), 0.25 mg (03/13/2019), 0.25 mg (03/20/2019) CARBOplatin  (PARAPLATIN ) 140 mg in sodium chloride  0.9 % 250 mL chemo infusion, 140 mg (107.3 % of original dose 126 mg), Intravenous,  Once, 1 of 1 cycle Dose modification:   (original dose 126 mg, Cycle 1) Administration: 140 mg (01/21/2019), 140 mg (02/12/2019), 150 mg (02/19/2019), 130 mg (03/13/2019), 150 mg (03/20/2019) PACLitaxel  (TAXOL ) 90 mg in sodium chloride  0.9 % 250 mL chemo infusion (</= 80mg /m2), 50 mg/m2 = 90 mg, Intravenous,  Once, 1 of 1 cycle Administration: 90 mg  (01/21/2019), 90 mg (02/12/2019), 90 mg (02/19/2019), 90 mg (03/13/2019), 90 mg (03/20/2019)  for chemotherapy treatment.     Previous Treatment   For Metastatic Lung Cancer 1 Palliative radiation therapy to the large left ilium metastases 2. IV Nivolumab  x 20 cycles (discontinued due to likely immune colitis) 3. Xgeva  120mg  [place] q4weeks for bone metastases.   For Esophageal Adenocarcinoma S/p Concurrent carbo/taxol  + RT  Current Treatment   1) Active surveillance 2) Xgeva  120mg   q12weeks for bone metastases. 3) Sandostatin  q4weeks for diarrhea   INTERVAL HISTORY:  Cindy Byrd is a 84 y.o. female who is here today for continued evaluation and management of metastatic lung cancer; esophageal cancer. She is accompanied by her son and a friend. She is ambulating with a wheel chair.   she was last seen by me on 11/05/2024; at the time she did not have any concerns and was doing well.   Today, she endorses severe pain in her forehead where her shingles has flared up. She is still taking Valtrex .  She endorses intermittent back pain around the sacrum. We discussed possible arthritic inflammation.  New dx of small cell carcinoma.  She denies any abdominal pain, HAs, nausea and changes in breathing.   REVIEW OF SYSTEMS:   10 Point review of systems of done and is negative except as noted above.  MEDICAL HISTORY Past Medical History:  Diagnosis Date   Barrett's esophagus    Bilateral pulmonary embolism (HCC) 09/02/2016   09/02/16 bilateral pulmonary emboli in context of extensive bilateral lower extremity deep venous thromboses Assumed hypercoagulability due to non-small cell metastatic lung cancer Lifelong anticoagulation recommended   Bone neoplasm 06/24/2015   Cancer (HCC)    metastatic poorly differentiated carcinoma. tumor left groin surgical removal with radiation tx.   Cataract    BILATERAL   Cigarette smoker two packs a day or less    Currently still smoking 2 PPD - Not  interested in quitting at this time.   Colitis 2017   Colon polyps    hyperplastic, tubular adenomas, tubulovillous adenoma   Cough, persistent    hx. lung cancer ? primary-being evaluated, unsure of primary site.   Depression 06/24/2015   Diverticulosis    DVT of lower extremity, bilateral (HCC) 09/02/2016   Dysrhythmia    PAF in setting of submassive PE 09/2016   Emphysema of lung (HCC)    Endometriosis    Hysterectomy with BSO at age 53 yrs   Esophageal adenocarcinoma (HCC) 08/11/2015   intramucosal   Gastritis    GERD (gastroesophageal reflux disease)    H/O: pneumonia    Hiatal hernia    Hyperglycemia    A1c 7.4% 06/20/22   Hyperlipidemia    Hypertension 06/24/2015   likely improved incidental to 40 lbs weight loss from her neoplasm. No Longer taking med for this as of 08-06-15   IBS (irritable bowel syndrome)    Lung cancer (HCC)    suspected IV lung cancer with left ileum mets 2017, s/p left ileum radiaiton, immunotherapy   Pain    left hip-persistenttumor of bone-radiation tx. 10.   Pneumonia  Vitamin D  deficiency disease     SURGICAL HISTORY Past Surgical History:  Procedure Laterality Date   ABDOMINAL HYSTERECTOMY     BALLOON DILATION N/A 10/08/2019   Procedure: BALLOON DILATION;  Surgeon: Albertus Gordy HERO, MD;  Location: [facility];  Service: Gastroenterology;  Laterality: N/A;   BARTHOLIN GLAND CYST EXCISION  84 yo ago   Does not want if it was an infected cyst or tumor. Was soon as delivery   BIOPSY  01/02/2019   Procedure: BIOPSY;  Surgeon: Albertus Gordy HERO, MD;  Location: [facility];  Service: Gastroenterology;;   CATARACT EXTRACTION     COLONOSCOPY W/ POLYPECTOMY     multiple times - last done 09/2014 per patient.   ESOPHAGOGASTRODUODENOSCOPY (EGD) WITH PROPOFOL  N/A 08/11/2015   Procedure: ESOPHAGOGASTRODUODENOSCOPY (EGD) WITH PROPOFOL ;  Surgeon: Gordy HERO Albertus, MD;  Location: [facility];  Service: Gastroenterology;  Laterality: N/A;    ESOPHAGOGASTRODUODENOSCOPY (EGD) WITH PROPOFOL  N/A 01/02/2019   Procedure: ESOPHAGOGASTRODUODENOSCOPY (EGD) WITH PROPOFOL ;  Surgeon: Albertus Gordy HERO, MD;  Location: [facility];  Service: Gastroenterology;  Laterality: N/A;   ESOPHAGOGASTRODUODENOSCOPY (EGD) WITH PROPOFOL  N/A 10/08/2019   Procedure: ESOPHAGOGASTRODUODENOSCOPY (EGD) WITH PROPOFOL ;  Surgeon: Albertus Gordy HERO, MD;  Location: [facility];  Service: Gastroenterology;  Laterality: N/A;   EYE SURGERY Right 2024   Multiple surgeries over the past year.   FLEXIBLE SIGMOIDOSCOPY N/A 06/24/2017   Procedure: FLEXIBLE SIGMOIDOSCOPY;  Surgeon: Leigh Elspeth Mt, MD;  Location: [facility];  Service: Gastroenterology;  Laterality: N/A;   GANGLION CYST EXCISION     KNEE ARTHROSCOPY  age about 13 yrs   TONSILLECTOMY     TOTAL ABDOMINAL HYSTERECTOMY W/ BILATERAL SALPINGOOPHORECTOMY  at age 68 yrs   For endometriosis   VIDEO BRONCHOSCOPY WITH ENDOBRONCHIAL ULTRASOUND Bilateral 06/06/2023   Procedure: VIDEO BRONCHOSCOPY WITH ENDOBRONCHIAL ULTRASOUND;  Surgeon: Brenna Adine CROME, DO;  Location: [facility];  Service: Cardiopulmonary;  Laterality: Bilateral;    SOCIAL HISTORY Social History[1]  Social History   Social History Narrative   Not on file    SOCIAL DRIVERS OF HEALTH SDOH Screenings   Food Insecurity: No Food Insecurity (05/14/2024)  Housing: Low Risk (05/14/2024)  Transportation Needs: No Transportation Needs (05/14/2024)  Utilities: Not At Risk (05/14/2024)  Alcohol Screen: Low Risk (03/28/2023)  Depression (PHQ2-9): Low Risk (10/14/2024)  Financial Resource Strain: Low Risk (03/28/2023)  Physical Activity: Inactive (03/28/2023)  Social Connections: Socially Isolated (01/31/2024)  Stress: No Stress Concern Present (03/28/2023)  Tobacco Use: Medium Risk (12/16/2024)     FAMILY HISTORY Family History  Problem Relation Age of Onset   Colon cancer Brother    Colon cancer Brother    Stroke Mother    Colon cancer Father     Emphysema Father        smoked   Breast cancer Daughter 40       ER/PR+ stage II     ALLERGIES: is allergic to penicillins, remeron  [mirtazapine ], and latex.  MEDICATIONS  Current Outpatient Medications  Medication Sig Dispense Refill   apixaban  (ELIQUIS ) 5 MG TABS tablet Take 1 tablet (5 mg total) by mouth 2 (two) times daily. 180 tablet 1   budesonide -glycopyrrolate-formoterol  (BREZTRI  AEROSPHERE) 160-9-4.8 MCG/ACT AERO inhaler INHALE 2 PUFFS INTO THE LUNGS TWICE A DAY 10.7 each 11   buPROPion  (WELLBUTRIN  XL) 150 MG 24 hr tablet TAKE 1 TABLET BY MOUTH EVERY DAY 90 tablet 1   doxepin  (SINEQUAN ) 10 MG capsule TAKE 1 CAPSULE BY MOUTH AT BEDTIME. 90 capsule 1   DULoxetine  (CYMBALTA )  30 MG capsule TAKE 3 CAPSULES BY MOUTH EVERY DAY 270 capsule 1   fentaNYL  (DURAGESIC ) 25 MCG/HR Place 1 patch onto the skin every 3 (three) days. 10 patch 0   gabapentin  (NEURONTIN ) 800 MG tablet TAKE 1 TABLET BY MOUTH THREE TIMES A DAY 270 tablet 1   HYDROcodone -acetaminophen  (NORCO) 5-325 MG tablet Take 1 tablet by mouth every 6 (six) hours as needed for moderate pain (pain score 4-6). 30 tablet 0   lidocaine -prilocaine  (EMLA ) cream Apply 1 Application topically as needed. 30 g 0   omeprazole  (PRILOSEC) 40 MG capsule TAKE 1 CAPSULE BY MOUTH EVERY DAY BEFORE BREAKFAST 90 capsule 1   ondansetron  (ZOFRAN -ODT) 4 MG disintegrating tablet Take 1 tablet (4 mg total) by mouth every 8 (eight) hours as needed for nausea or vomiting. 20 tablet 0   valACYclovir  (VALTREX ) 500 MG tablet Take 1 tablet (500 mg total) by mouth 2 (two) times daily. 180 tablet 3   No current facility-administered medications for this visit.    PHYSICAL EXAMINATION: ECOG PERFORMANCE STATUS: 1 - Symptomatic but completely ambulatory VITALS: Vitals:   12/26/24 1137 12/26/24 1146  BP: (!) 146/62 (!) 142/68  Pulse: 66   Resp: 20   Temp: 97.7 F (36.5 C)   SpO2: 96%    Filed Weights   12/26/24 1137  Weight: 145 lb 9.6 oz (66 kg)   Body  mass index is 22.14 kg/m.  GENERAL: alert, in no acute distress and comfortable SKIN: no acute rashes, no significant lesions EYES: conjunctiva are pink and non-injected, sclera anicteric OROPHARYNX: MMM, no exudates, no oropharyngeal erythema or ulceration NECK: supple, no JVD LYMPH:  no palpable lymphadenopathy in the cervical, axillary or inguinal regions LUNGS: clear to auscultation b/l with normal respiratory effort HEART: regular rate & rhythm ABDOMEN:  normoactive bowel sounds , non tender, not distended, no hepatosplenomegaly Extremity: no pedal edema PSYCH: alert & oriented x 3 with fluent speech NEURO: no focal motor/sensory deficits  LABORATORY DATA:   I have reviewed the data as listed     Latest Ref Rng & Units 12/16/2024   11:17 AM 11/05/2024   11:53 AM 09/23/2024   12:30 PM  CBC EXTENDED  WBC 4.0 - 10.5 K/uL 9.3  10.3  10.5   RBC 3.87 - 5.11 MIL/uL 4.17  4.20  3.99   Hemoglobin 12.0 - 15.0 g/dL 86.8  86.7  87.5   HCT 36.0 - 46.0 % 40.8  39.8  38.0   Platelets 150 - 400 K/uL 350  341  331   NEUT# 1.7 - 7.7 K/uL 6.2  7.6  7.1   Lymph# 0.7 - 4.0 K/uL 1.9  1.7  2.1        Latest Ref Rng & Units 12/16/2024   11:17 AM 11/05/2024   11:53 AM 09/23/2024   12:30 PM  CMP  Glucose 70 - 99 mg/dL 893  792  871   BUN 8 - 23 mg/dL 15  13  15    Creatinine 0.44 - 1.00 mg/dL 8.92  8.99  9.12   Sodium 135 - 145 mmol/L 141  139  140   Potassium 3.5 - 5.1 mmol/L 3.7  3.4  3.6   Chloride 98 - 111 mmol/L 105  103  107   CO2 22 - 32 mmol/L 25  24  29    Calcium  8.9 - 10.3 mg/dL 89.9  9.3  9.8   Total Protein 6.5 - 8.1 g/dL 7.9  7.8  7.5   Total Bilirubin 0.0 -  1.2 mg/dL 0.3  0.3  0.3   Alkaline Phos 38 - 126 U/L 75  80  51   AST 15 - 41 U/L 45  23  22   ALT 0 - 44 U/L 12  21  16     06/27/2017 SURGICAL PATHOLOGY       01/02/19 Esophagus Biopsy:     SURGICAL PATHOLOGY  CASE: [id number]  PATIENT: HERON INCH  Surgical Pathology Report      Clinical History:  Liver masses, hx met lung ca (las)      FINAL MICROSCOPIC DIAGNOSIS:   A. LIVER, BIOPSY:  - Neuroendocrine carcinoma morphologically consistent with small cell  carcinoma.   Comment:  Pe  RADIOGRAPHIC STUDIES: I have personally reviewed the radiological images as listed and agreed with the findings in the report. US  BIOPSY (LIVER) Result Date: 12/16/2024 INDICATION: Patient with hx lung cancer and esophageal carcinoma with liver lesions concerning for metastasis-- Bx for tissue diagnosis and molecular profiling EXAM: ULTRASOUND GUIDED LIVER MASS BIOPSY COMPARISON:  MRI abdomen, 10/28/2024. CT chest, 10/28/2024. CT AP, 01/30/2024. MEDICATIONS: None ANESTHESIA/SEDATION: Moderate (conscious) sedation was employed during this procedure. A total of Versed  2 mg and Fentanyl  100 mcg was administered intravenously. Moderate Sedation Time: 20 minutes. The patient's level of consciousness and vital signs were monitored continuously by radiology nursing throughout the procedure under my direct supervision. COMPLICATIONS: None immediate. PROCEDURE: Informed written consent was obtained from the patient and/or patient's representative after a discussion of the risks, benefits and alternatives to treatment. The patient understands and consents the procedure. A timeout was performed prior to the initiation of the procedure. Ultrasound scanning was performed of the right upper abdominal quadrant demonstrates multifocal liver masses. The LEFT hepatic lobe mass was selected for biopsy and the procedure was planned. The right upper abdominal quadrant was prepped and draped in the usual sterile fashion. The overlying soft tissues were anesthetized with 1% lidocaine . A 17 gauge co-axial needle was advanced into a peripheral aspect of the lesion. This was followed by 3 core biopsies with an 18 gauge core device under direct ultrasound guidance. The needle was removed, then superficial hemostasis was obtained with manual  compression. The coaxial needle tract was embolized with Gel-Foam slurry, then superficial hemostasis was obtained with manual compression. Post procedural scanning was negative for definitive area of hemorrhage or additional complication. A dressing was placed. The patient tolerated the procedure well without immediate post procedural complication. IMPRESSION: Successful ultrasound-guided core needle biopsy of a liver mass. Thom Hall, MD [facility] Electronically Signed   By: Thom Hall M.D.   On: 12/16/2024 12:50   CT Chest W Contrast Result Date: 11/01/2024 CLINICAL DATA:  Metastatic lung cancer to liver and esophagus status post chemotherapy, radiation, and immunotherapy. Chronic shortness of breath. * Tracking Code: BO * EXAM: CT CHEST WITH CONTRAST TECHNIQUE: Multidetector CT imaging of the chest was performed during intravenous contrast administration. RADIATION DOSE REDUCTION: This exam was performed according to the departmental dose-optimization program which includes automated exposure control, adjustment of the mA and/or kV according to patient size and/or use of iterative reconstruction technique. CONTRAST:  75mL OMNIPAQUE  IOHEXOL  300 MG/ML  SOLN COMPARISON:  CT chest dated 09/02/2024 FINDINGS: Cardiovascular: Right chest wall port terminates in the lower SVC. Normal heart size. No significant pericardial fluid/thickening. Great vessels are normal in course and caliber. Chronically attenuated appearance of left upper lobe pulmonary arteries, likely sequela of chronic thrombus. No acute central pulmonary emboli. Coronary  artery calcifications and aortic atherosclerosis including irregular plaque at the aortic arch. Mediastinum/Nodes: Imaged thyroid  gland without nodules meeting criteria for imaging follow-up by size. Normal esophagus. No pathologically enlarged axillary, supraclavicular, mediastinal, or hilar lymph nodes. Lungs/Pleura:  The central airways are patent. Moderate centrilobular and paraseptal emphysema. Scattered peripheral linear atelectasis/scarring within the posterior bilateral lower lobes and anterior bilateral upper lobes. Interval increased density of 9 x 9 mm peripheral left upper lobe nodule (12:35), which is unchanged in size compared to 09/02/2024. Unchanged 3 mm previously hypermetabolic left upper lobe peribronchovascular nodule (12:60). Scattered subsegmental mucous plugging. Additional irregular nodules are unchanged, for example posterior right upper lobe (12:43 and posterior bilateral lower lobes (12:82). No pneumothorax. No pleural effusion. Upper abdomen: Please see separately dictated MRI abdomen report for detailed findings. Interval increase in size of several hepatic lesions: -2.1 cm superior segment 8 lesion (6:117), previously 1.0 cm -1.5 cm peripheral segment 3 (6:153), previously 1.1 cm 2.2 cm medial segment 4 lesion (6:128) is unchanged. A subcentimeter hypodensity in peripheral segment 7/8 (6:130) and subcentimeter enhancing focus in peripheral segment 7/8 (6:143) are too small to characterize. Additional cyst in the inferior right hepatic lobe. Musculoskeletal: No acute or abnormal lytic or blastic osseous lesions. Old left posterior rib fractures. IMPRESSION: 1. Interval increased density of 9 mm peripheral left upper lobe nodule, which is unchanged in size compared to 09/02/2024, suspicious for neoplasm. 2. Unchanged 3 mm previously hypermetabolic left upper lobe peribronchovascular nodule. 3. Interval increase in size of several hepatic lesions, suspicious for worsening metastatic disease, better evaluated on same-day MRI abdomen. 4. Aortic Atherosclerosis (ICD10-I70.0) and Emphysema (ICD10-J43.9). Coronary artery calcifications. Assessment for potential risk factor modification, dietary therapy or pharmacologic therapy may be warranted, if clinically indicated. Electronically Signed   By: Limin  Xu  M.D.   On: 11/01/2024 11:28   MR Abdomen W Wo Contrast Result Date: 10/28/2024 CLINICAL DATA:  f/u fir metastatic lung cancer to evaluate response to treatment. new liver lesion indetermate. EXAM: MRI ABDOMEN WITHOUT AND WITH CONTRAST TECHNIQUE: Multiplanar multisequence MR imaging of the abdomen was performed both before and after the administration of intravenous contrast. CONTRAST:  6mL GADAVIST  GADOBUTROL  1 MMOL/ML IV SOLN COMPARISON:  CT scan chest from 09/02/2024. FINDINGS: Lower chest: Unremarkable MR appearance to the lung bases. No pleural effusion. No pericardial effusion. Normal heart size. Hepatobiliary: The liver is normal in size. Noncirrhotic configuration. There are at least 4, mildly T2 hyperintense, target like lesions in the liver (marked with electronic arrow sign on series 4, images 5, 9, 11 and 17) with largest in the left hepatic lobe, segment 4A measuring up to 2.1 x 2.4 cm. These are compatible with metastases. These lesions corresponds to the lesions described on the prior CT scan chest. There is interval increase in the size of the lesion, favoring worsening metastatic disease. There are additional several T2 hyperintense lesions with largest in the right hepatic lobe, segment 5 measuring up to 10 x 14 mm (series 4, images 18, 21 and 23), which can be characterized as simple cysts. No intrahepatic or extrahepatic bile duct dilatation. No choledocholithiasis. Small volume dependent gallstones noted without imaging signs of acute cholecystitis. Pancreas: No mass, inflammatory changes or other parenchymal abnormality identified. No main pancreatic duct dilation. Spleen:  Lobulated spleen noted.  No focal lesion. Adrenals/Urinary Tract: Unremarkable adrenal glands. Mild diffuse cortical thinning of left kidney. Probable focal scarring noted in the left kidney upper pole. No hydroureteronephrosis on either side. Bilateral extrarenal pelvis noted. There  are multiple scattered subcentimeter  sized simple cortical cysts in bilateral kidneys. No suspicious renal mass. Stomach/Bowel: Visualized portions within the abdomen are unremarkable. No disproportionate dilation of bowel loops. Vascular/Lymphatic: No pathologically enlarged lymph nodes identified. No abdominal aortic aneurysm demonstrated. No ascites. Other:  None. Musculoskeletal: No suspicious bone lesions identified. IMPRESSION: 1. There are at least 4, target like lesions in the liver, compatible with worsening metastases. 2. Multiple other simple cysts in the liver. 3. Cholelithiasis without acute cholecystitis. 4. Multiple other nonacute observations, as described above. Electronically Signed   By: Ree Molt M.D.   On: 10/28/2024 16:01    ASSESSMENT & PLAN:  84 y.o. female with  #1 Metastatic poorly differentiated carcinoma with likely lung primary non-small cell lung cancer.     CT of the head with and without contrast showed no evidence of metastatic disease. EGFR blood test mutation analysis negative. CT chest abdomen pelvis 04/19/2016 shows no evidence of disease progression. Patient tolerated Nivolumab  very well but was discontinued when she developed grade 2 Immune colitis. Has been off Nivolumab  for >6 months   CT chest abdomen pelvis on 06/24/2016 shows no evidence of new disease or progression of metastatic disease. CT chest abdomen pelvis 09/06/2016 shows 1. Mixed interval response to therapy. 2. There is a new left ventral chest wall lesion deep to the pectoralis musculature worrisome for metastatic disease. 3. Posterior lower lobe nodular densities are identified which may reflect areas of pulmonary metastasis. 4. Interval decrease in size of destructive lesion involving the left iliac bone.   CT chest abd pelvis 12/08/2016: Cystic mass involving the left ventral chest wall has resolved in the interval. Likely was a hematoma due to trauma. Interval increase in size of pleural base mass overlying the posterior and  inferior left lower lobe. There is also a new left pleural effusion identified.   CT chest 02/01/2017: Residual irregular soft tissue thickening/volume loss and trace left pleural fluid at the base of the left hemithorax, overall improved in appearance from 12/08/2016. No measurable lesion.   CT chest 05/29/2017 shows no residual pleural based mass or significant pleural effusion in the left hemithorax. No evidence of thoracic metastatic disease. No evidence of progressive metastatic disease within the abdomen or pelvis. Mixed lytic and blastic lesion involving the left iliac bone and associated pathologic fracture are unchanged.    CT CAP 09/14/17 shows no new changes. She does have slight displacement of her fractured left iliac bone. Evidence of stable disease.    CT CAP 01/04/2018- No new or progressive metastatic disease. Stable large left iliac bone metastasis with associated chronic pathologic fracture.    CT chest/abd/pelvis done on 04/26/18 revealed Stable exam.  No new or progressive interval findings.   07/19/18 CT C/A/P revealed Stable exam.  No new or progressive interval findings. Large destructive left iliac lesion is similar to prior. Aortic Atherosclerosis and Emphysema.     11/06/18 CT C/A/P revealed Similar appearance of large mixed lytic and sclerotic lesion in the left ilium. No new metastatic lesions are otherwise noted elsewhere in the chest, abdomen or pelvis. 2. Interval development of thickening of the distal third of the esophagus. This is nonspecific, and could be related to underlying reflux esophagitis. However, if there is any clinical concern for Barrett's metaplasia or esophageal neoplasia, further evaluation with nonemergent endoscopy could be considered. 3. Aortic atherosclerosis, in addition to left main coronary artery disease. Assessment for potential risk factor modification, dietary therapy or pharmacologic therapy may be warranted,  if clinically Indicated. 4. Diffuse  bronchial wall thickening with mild to moderate centrilobular and paraseptal emphysema; imaging findings suggestive of underlying COPD. 5. Additional incidental findings, as above.   #2  Adenocarcinoma of the Esophagus  Barrett's esophagus 4cms in the distal esophagus with low and high-grade dysplasia   01/02/19 Surgical pathology revealed adenocarcinoma of the esophagus    01/25/19 PET/CT revealed Distal esophageal primary, without hypermetabolic metastatic disease. 2. Chronic left iliac metastasis, as before. 3. Hypermetabolism within and superficial to the right gluteal musculature is most likely related to trauma and/or injection sites. 4. Aortic atherosclerosis, coronary artery atherosclerosis and emphysema.   S/p concurrent Carboplatin  and Taxol  weekly with RT of 45 Gy in 25 fractions and 5.4 Gy boost, completed between 02/04/19 and 03/27/19   07/03/2019 PET skull base to thigh revealed 1. Interval response to therapy. Significant reduction in FDG uptake associated with distal esophageal mass. SUV max currently 2.61 versus 16.97 previously. 2. 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 Unchanged 3. Aortic Atherosclerosis (ICD10-I70.0) and Emphysema (ICD10-J43.9). Coronary artery calcifications.   07/15/2020 CT C/A/P ([id number]) ([id number]) revealed 1. No evidence of new or progressive metastatic disease in the chest, abdomen or pelvis.   #3 diarrhea-  now resolved was previously. 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 She was previously on on Lialda , budesonide ,probiotics and lomotil  but not currently taking any of these.   #4 h/o DVT and PE  -continue on Xarelto  - no issues with bleeding    #5 history of COPD management per primary care physician   #6 hx of severe shingles of the forehead with postherpetic neuralgia Lialda , budesonide ,probiotics  and lomotil  but not currently taking any of these.  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 -standard treatment if there is no cancer activity in the brain is a combination of chemothearpy drugs (carboplatin  and etopocide) plus an immunotherapy drug (Durvalumab)  -discussed standard 3 day treatment + Day 5 G-CSF, followed by a three week period (4 cycles total) followed by maintenance Immunotherapy. -if there are spots in the brain, there may be a need for radiation -goal of treatment is to control disease, preventing complications of the disease, and extending life expectancy  -discussed having affairs in orders. POA, etc --discussion goals of care in details  FOLLOW-UP   The total time spent in the appointment was 41 minutes* .  All of the patient's questions were answered and the patient knows to call the clinic with any problems, questions, or concerns.  Emaline Saran MD MS AAHIVMS [facility] Hematology/Oncology Physician [facility]  *Total Encounter Time as defined by the Centers for Medicare and Medicaid Services includes, in addition to the face-to-face time of a patient visit (documented in the note above) non-face-to-face time: obtaining and reviewing outside history, ordering and reviewing medications, tests or procedures, care coordination (communications with other health care professionals or caregivers) and documentation in the medical record.  LILLETTE Alan Blowers, acting as a neurosurgeon for Karesha Trzcinski, MD.,have documented all relevant documentation on the behalf of Emaline Saran, MD,as directed by  Emaline Saran, MD  while in the presence of Emaline Saran, MD.  I have reviewed the above  documentation for accuracy and completeness, and I agree with the above.  Emaline Saran, MD     [1]  Social History Tobacco Use   Smoking status: Former    Current packs/day: 0.00    Average packs/day: 1 pack/day for 60.0 years (60.0 ttl pk-yrs)    Types: Cigarettes    Start date: 12/05/1954    Quit date: 12/05/2014    Years since quitting: 10.0    Passive exposure: Never   Smokeless tobacco: Never  Vaping Use   Vaping status: Never Used  Substance Use Topics   Alcohol use: No    Alcohol/week: 0.0 standard drinks of alcohol   Drug use: No   "

## 2025-01-01 ENCOUNTER — Encounter: Payer: Self-pay | Admitting: Hematology

## 2025-01-01 DIAGNOSIS — C3490 Malignant neoplasm of unspecified part of unspecified bronchus or lung: Secondary | ICD-10-CM | POA: Insufficient documentation

## 2025-01-01 MED ORDER — ONDANSETRON HCL 8 MG PO TABS: 8.0000 mg | ORAL_TABLET | Freq: Three times a day (TID) | ORAL | 1 refills | Status: AC | PRN

## 2025-01-01 MED ORDER — DEXAMETHASONE 4 MG PO TABS: ORAL_TABLET | ORAL | 0 refills | Status: AC

## 2025-01-01 MED ORDER — LIDOCAINE-PRILOCAINE 2.5-2.5 % EX CREA: TOPICAL_CREAM | CUTANEOUS | 3 refills | Status: AC

## 2025-01-01 MED ORDER — PROCHLORPERAZINE MALEATE 10 MG PO TABS: 10.0000 mg | ORAL_TABLET | Freq: Four times a day (QID) | ORAL | 1 refills | Status: AC | PRN

## 2025-01-01 NOTE — Progress Notes (Signed)
 START ON PATHWAY REGIMEN - Small Cell Lung     A cycle is every 21 days:     Atezolizumab      Carboplatin       Etoposide   **Always confirm dose/schedule in your pharmacy ordering system**  Patient Characteristics: Newly Diagnosed, Preoperative or Nonsurgical Candidate (Clinical Staging), First Line, Extensive Stage Therapeutic Status: Newly Diagnosed, Preoperative or Nonsurgical Candidate (Clinical Staging) Check here if patient was staged using an edition other than AJCC Staging 9th Edition: false AJCC T Category: cTX AJCC N Category: cNX AJCC M Category: cM1 AJCC 9 Stage Grouping: IVA Stage Classification: Extensive Intent of Therapy: Non-Curative / Palliative Intent, Discussed with Patient

## 2025-01-02 ENCOUNTER — Other Ambulatory Visit: Payer: Self-pay

## 2025-01-05 ENCOUNTER — Other Ambulatory Visit: Payer: Self-pay

## 2025-01-08 ENCOUNTER — Encounter (HOSPITAL_COMMUNITY): Admission: RE | Admit: 2025-01-08

## 2025-01-08 ENCOUNTER — Ambulatory Visit (HOSPITAL_COMMUNITY): Admission: RE | Admit: 2025-01-08 | Discharge: 2025-01-08 | Attending: Hematology

## 2025-01-08 ENCOUNTER — Ambulatory Visit: Admitting: Family Medicine

## 2025-01-08 ENCOUNTER — Other Ambulatory Visit: Payer: Self-pay

## 2025-01-08 DIAGNOSIS — C3490 Malignant neoplasm of unspecified part of unspecified bronchus or lung: Secondary | ICD-10-CM

## 2025-01-08 LAB — GLUCOSE, CAPILLARY: Glucose-Capillary: 103 mg/dL — ABNORMAL HIGH (ref 70–99)

## 2025-01-08 MED ORDER — FLUDEOXYGLUCOSE F - 18 (FDG) INJECTION
7.2400 | Freq: Once | INTRAVENOUS | Status: AC | PRN
  Administered 2025-01-08: 7.24 via INTRAVENOUS

## 2025-01-08 MED ORDER — GADOBUTROL 1 MMOL/ML IV SOLN
7.0000 mL | Freq: Once | INTRAVENOUS | Status: AC | PRN
  Administered 2025-01-08: 7 mL via INTRAVENOUS

## 2025-01-10 MED FILL — Fosaprepitant Dimeglumine For IV Infusion 150 MG (Base Eq): INTRAVENOUS | Qty: 5 | Status: AC

## 2025-01-13 ENCOUNTER — Inpatient Hospital Stay: Attending: Hematology

## 2025-01-13 ENCOUNTER — Inpatient Hospital Stay

## 2025-01-14 ENCOUNTER — Inpatient Hospital Stay

## 2025-01-15 ENCOUNTER — Inpatient Hospital Stay

## 2025-01-17 ENCOUNTER — Inpatient Hospital Stay

## 2025-01-28 ENCOUNTER — Inpatient Hospital Stay

## 2025-01-28 ENCOUNTER — Inpatient Hospital Stay: Admitting: Hematology

## 2025-02-03 ENCOUNTER — Inpatient Hospital Stay

## 2025-02-03 ENCOUNTER — Inpatient Hospital Stay: Attending: Hematology | Admitting: Hematology

## 2025-02-04 ENCOUNTER — Inpatient Hospital Stay

## 2025-02-05 ENCOUNTER — Inpatient Hospital Stay

## 2025-02-07 ENCOUNTER — Inpatient Hospital Stay
